# Patient Record
Sex: Male | Born: 1935 | Race: White | Hispanic: No | Marital: Married | Smoking: Former smoker
Health system: Southern US, Community
[De-identification: ages and names within clinical notes are randomized; demographics above are authoritative.]

## PROBLEM LIST (undated history)

## (undated) DIAGNOSIS — R569 Unspecified convulsions: Secondary | ICD-10-CM

## (undated) DIAGNOSIS — K219 Gastro-esophageal reflux disease without esophagitis: Secondary | ICD-10-CM

## (undated) DIAGNOSIS — I4891 Unspecified atrial fibrillation: Secondary | ICD-10-CM

## (undated) DIAGNOSIS — I10 Essential (primary) hypertension: Secondary | ICD-10-CM

## (undated) DIAGNOSIS — J189 Pneumonia, unspecified organism: Secondary | ICD-10-CM

## (undated) DIAGNOSIS — R413 Other amnesia: Secondary | ICD-10-CM

## (undated) DIAGNOSIS — M1990 Unspecified osteoarthritis, unspecified site: Secondary | ICD-10-CM

## (undated) DIAGNOSIS — E559 Vitamin D deficiency, unspecified: Secondary | ICD-10-CM

## (undated) DIAGNOSIS — A419 Sepsis, unspecified organism: Secondary | ICD-10-CM

## (undated) DIAGNOSIS — D509 Iron deficiency anemia, unspecified: Secondary | ICD-10-CM

## (undated) DIAGNOSIS — F5104 Psychophysiologic insomnia: Secondary | ICD-10-CM

## (undated) DIAGNOSIS — R42 Dizziness and giddiness: Secondary | ICD-10-CM

## (undated) DIAGNOSIS — I2510 Atherosclerotic heart disease of native coronary artery without angina pectoris: Secondary | ICD-10-CM

## (undated) DIAGNOSIS — E039 Hypothyroidism, unspecified: Secondary | ICD-10-CM

## (undated) DIAGNOSIS — R404 Transient alteration of awareness: Secondary | ICD-10-CM

## (undated) DIAGNOSIS — N183 Chronic kidney disease, stage 3 unspecified: Secondary | ICD-10-CM

## (undated) DIAGNOSIS — E7800 Pure hypercholesterolemia, unspecified: Secondary | ICD-10-CM

## (undated) DIAGNOSIS — C801 Malignant (primary) neoplasm, unspecified: Secondary | ICD-10-CM

## (undated) DIAGNOSIS — T8859XA Other complications of anesthesia, initial encounter: Secondary | ICD-10-CM

## (undated) DIAGNOSIS — T4145XA Adverse effect of unspecified anesthetic, initial encounter: Secondary | ICD-10-CM

## (undated) DIAGNOSIS — I739 Peripheral vascular disease, unspecified: Secondary | ICD-10-CM

## (undated) DIAGNOSIS — R269 Unspecified abnormalities of gait and mobility: Secondary | ICD-10-CM

## (undated) HISTORY — PX: LAMINECTOMY: SHX219

## (undated) HISTORY — DX: Unspecified osteoarthritis, unspecified site: M19.90

## (undated) HISTORY — DX: Iron deficiency anemia, unspecified: D50.9

## (undated) HISTORY — PX: TONSILLECTOMY: SUR1361

## (undated) HISTORY — DX: Vitamin D deficiency, unspecified: E55.9

## (undated) HISTORY — DX: Transient alteration of awareness: R40.4

## (undated) HISTORY — PX: HERNIA REPAIR: SHX51

## (undated) HISTORY — DX: Unspecified abnormalities of gait and mobility: R26.9

## (undated) HISTORY — DX: Psychophysiologic insomnia: F51.04

## (undated) HISTORY — PX: KNEE ARTHROPLASTY: SHX992

## (undated) HISTORY — DX: Other amnesia: R41.3

## (undated) HISTORY — DX: Peripheral vascular disease, unspecified: I73.9

## (undated) HISTORY — PX: EYE SURGERY: SHX253

## (undated) HISTORY — PX: BACK SURGERY: SHX140

## (undated) HISTORY — DX: Chronic kidney disease, stage 3 unspecified: N18.30

## (undated) NOTE — *Deleted (*Deleted)
Progress Note  Patient Name: [name] Date of Encounter: 01/08/2020  [facility] Cardiologist: Thurmon Fair, MD ***  Subjective   ***  Inpatient Medications    Scheduled Meds: . diltiazem  240 mg Oral Daily  . divalproex  250 mg Oral BID  . doxazosin  4 mg Oral QHS  . levothyroxine  150 mcg Oral QAC breakfast  . metoprolol tartrate  25 mg Oral BID  . pravastatin  40 mg Oral QPM  . predniSONE  9 mg Oral Daily  . sodium chloride flush  3 mL Intravenous Q12H   Continuous Infusions: . lactated ringers 0 mL (01/08/20 0935)  . pantoprozole (PROTONIX) infusion 8 mg/hr (01/08/20 1205)   PRN Meds: acetaminophen **OR** acetaminophen, hydrALAZINE, HYDROcodone-acetaminophen, morphine injection, ondansetron **OR** ondansetron (ZOFRAN) IV   Vital Signs    Vitals:   01/08/20 1321 01/08/20 1430 01/08/20 1450 01/08/20 1513  BP: 134/78 (!) 101/49 118/67 126/74  Pulse: 73 78 (!) 57   Resp: (!) 22 (!) 24 17 16   Temp: (!) 97.3 F (36.3 C) 97.7 F (36.5 C)  97.7 F (36.5 C)  TempSrc: Temporal Tympanic  Oral  SpO2: 96%  95% 99%  Height:        Intake/Output Summary (Last 24 hours) at 01/08/2020 1906 Last data filed at 01/08/2020 1425 Gross per 24 hour  Intake 1876.45 ml  Output 1450 ml  Net 426.45 ml   Last 3 Weights 12/29/2019 12/01/2019 10/27/2019  Weight (lbs) 155 lb 155 lb 155 lb  Weight (kg) 70.308 kg 70.308 kg 70.308 kg      Telemetry    *** - Personally Reviewed  ECG    *** - Personally Reviewed  Physical Exam  *** GEN: No acute distress.   Neck: No JVD Cardiac: RRR, no murmurs, rubs, or gallops.  Respiratory: Clear to auscultation bilaterally. GI: Soft, nontender, non-distended  MS: No edema; No deformity. Neuro:  Nonfocal  Psych: Normal affect   Labs    High Sensitivity Troponin:   Recent Labs  Lab 01/07/20 0507 01/07/20 0641  TROPONINIHS 151* 157*      Chemistry Recent Labs  Lab 01/07/20 0507 01/08/20 0553  NA 141 141  K 4.4  4.2  CL 106 108  CO2 23 23  GLUCOSE 92 80  BUN 49* 37*  CREATININE 2.03* 1.60*  CALCIUM 9.0 8.3*  PROT 5.2*  --   ALBUMIN 3.2*  --   AST 19  --   ALT 11  --   ALKPHOS 52  --   BILITOT 0.9  --   GFRNONAA 32* 42*  ANIONGAP 12 10     Hematology Recent Labs  Lab 01/07/20 1643 01/08/20 0553 01/08/20 1810  WBC 9.0 7.9 8.8  RBC 2.62* 2.59* 2.78*  HGB 8.4* 8.2* 9.0*  HCT 27.5* 26.6* 28.9*  MCV 105.0* 102.7* 104.0*  MCH 32.1 31.7 32.4  MCHC 30.5 30.8 31.1  RDW 14.6 14.6 14.3  PLT 123* 134* 139*    BNP Recent Labs  Lab 01/07/20 0507  BNP 519.4*     DDimer No results for input(s): DDIMER in the last 168 hours.   Radiology    DG Chest 2 View  Result Date: 01/07/2020 CLINICAL DATA:  Chest pain EXAM: CHEST - 2 VIEW COMPARISON:  12/20/2018 FINDINGS: Streaky density at the left lung base, unchanged and attributed to scarring. There is no edema, consolidation, effusion, or pneumothorax. Normal heart size and mediastinal contours when allowing for rotation. Coronary atherosclerosis. Advanced lower  thoracic and lumbar spine degeneration IMPRESSION: Scarring at the left lung base. No acute finding when compared to prior. Electronically Signed   By: Marnee Spring M.D.   On: 01/07/2020 05:52    Cardiac Studies   Echo: 03/2018 Study Conclusions  - Left ventricle: The cavity size was normal. Wall thickness was  increased in a pattern of moderate LVH. Systolic function was  normal. The estimated ejection fraction was in the range of 55%  to 60%. Left ventricular diastolic function parameters were  normal.  - Aortic valve: There was mild regurgitation.  - Mitral valve: Calcified annulus. Mildly thickened leaflets .  There was mild regurgitation. Valve area by pressure half-time:  1.04 cm^2.  - Left atrium: The atrium was mildly dilated.  - Atrial septum: No defect or patent foramen ovale was identified.   Patient Profile     45 y.o. male with a hx of this post  PCI to RCA '02, diastolic heart failure, severe orthostatic hypotension, CKD stage III, chronic atrial fibrillation, chronic slow healing right BKA wound with history of severe spontaneous hemorrhage (has been off anticoagulation), and chronic corticosteroid therapy who is being seen today for the evaluation of preop evaluation at the request of Dr. Ophelia Charter  Assessment & Plan    # Preop evaluation in the setting of acute GI bleed/planned EGD: Presents with anemia and positive fecal occult stool.  Has history of chronic atrial fibrillation but not on oral anticoagulation secondary to severe bleeding from a right BKA.  Has only been on aspirin 81 mg daily.  Has history of CAD with PCI to the RCA in 2002. Did have some chest pain that was burning in nature prior to admission. hsTn mildly elevated with flat trend not suggestive of ACS.  EKG with rate controlled atrial fibrillation.  No further cardiac work-up at this time.  He is notably at higher risk just given his coronary disease and chronic comorbidities, this is not prohibitive. -- Continue to monitor volume status closely as he may require diuretics post procedure --Follow CBC, would transfuse for hemoglobin less than 8  # Chronic atrial fibrillation: Had previously been placed on Eliquis and developed severe spontaneous hemorrhage with right BKA.  Decision has been made to keep him off of anticoagulation since that time.  He is currently in rate controlled atrial fibrillation.  --Continue home medications  # CAD status post PCI to RCA '02:  -Continue ASA and statin  # CKD stage III:  --Daily BMET  # Hyperlipidemia: On statin {Are we signing off today?:[id number]}  For questions or updates, please contact [facility] Please consult [email or web address] for contact info under        Signed, Meriam Sprague, MD  01/08/2020, 7:06 PM

---

## 1999-02-16 ENCOUNTER — Inpatient Hospital Stay (HOSPITAL_COMMUNITY): Admission: RE | Admit: 1999-02-16 | Discharge: 1999-02-21 | Payer: Self-pay | Admitting: Orthopaedic Surgery

## 1999-02-16 ENCOUNTER — Encounter: Payer: Self-pay | Admitting: Orthopaedic Surgery

## 1999-02-16 ENCOUNTER — Encounter (INDEPENDENT_AMBULATORY_CARE_PROVIDER_SITE_OTHER): Payer: Self-pay | Admitting: Specialist

## 1999-03-20 ENCOUNTER — Encounter: Admission: RE | Admit: 1999-03-20 | Discharge: 1999-05-01 | Payer: Self-pay | Admitting: Orthopaedic Surgery

## 2000-09-30 ENCOUNTER — Ambulatory Visit (HOSPITAL_COMMUNITY): Admission: RE | Admit: 2000-09-30 | Discharge: 2000-10-01 | Payer: Self-pay | Admitting: Cardiology

## 2000-10-29 ENCOUNTER — Encounter (HOSPITAL_COMMUNITY): Admission: RE | Admit: 2000-10-29 | Discharge: 2001-01-27 | Payer: Self-pay | Admitting: Cardiology

## 2001-04-10 ENCOUNTER — Ambulatory Visit (HOSPITAL_COMMUNITY): Admission: RE | Admit: 2001-04-10 | Discharge: 2001-04-10 | Payer: Self-pay | Admitting: Gastroenterology

## 2002-10-26 ENCOUNTER — Encounter: Admission: RE | Admit: 2002-10-26 | Discharge: 2002-10-26 | Payer: Self-pay | Admitting: Orthopedic Surgery

## 2002-10-26 ENCOUNTER — Encounter: Payer: Self-pay | Admitting: Orthopedic Surgery

## 2003-02-02 ENCOUNTER — Inpatient Hospital Stay (HOSPITAL_COMMUNITY): Admission: RE | Admit: 2003-02-02 | Discharge: 2003-02-05 | Payer: Self-pay | Admitting: Orthopaedic Surgery

## 2004-11-26 ENCOUNTER — Encounter: Admission: RE | Admit: 2004-11-26 | Discharge: 2004-11-26 | Payer: Self-pay | Admitting: Orthopaedic Surgery

## 2004-12-12 ENCOUNTER — Encounter: Admission: RE | Admit: 2004-12-12 | Discharge: 2004-12-12 | Payer: Self-pay | Admitting: Orthopaedic Surgery

## 2004-12-26 ENCOUNTER — Encounter: Admission: RE | Admit: 2004-12-26 | Discharge: 2004-12-26 | Payer: Self-pay | Admitting: Orthopaedic Surgery

## 2005-06-18 ENCOUNTER — Encounter: Admission: RE | Admit: 2005-06-18 | Discharge: 2005-06-18 | Payer: Self-pay | Admitting: Internal Medicine

## 2005-09-23 ENCOUNTER — Encounter: Admission: RE | Admit: 2005-09-23 | Discharge: 2005-09-23 | Payer: Self-pay | Admitting: Neurosurgery

## 2006-04-19 ENCOUNTER — Encounter: Admission: RE | Admit: 2006-04-19 | Discharge: 2006-04-19 | Payer: Self-pay | Admitting: Neurosurgery

## 2007-05-25 ENCOUNTER — Inpatient Hospital Stay (HOSPITAL_COMMUNITY): Admission: EM | Admit: 2007-05-25 | Discharge: 2007-05-28 | Payer: Self-pay | Admitting: Emergency Medicine

## 2007-05-26 ENCOUNTER — Ambulatory Visit: Payer: Self-pay | Admitting: Infectious Diseases

## 2007-09-15 ENCOUNTER — Inpatient Hospital Stay (HOSPITAL_COMMUNITY): Admission: AD | Admit: 2007-09-15 | Discharge: 2007-09-17 | Payer: Self-pay | Admitting: Neurosurgery

## 2007-09-25 ENCOUNTER — Inpatient Hospital Stay (HOSPITAL_COMMUNITY): Admission: RE | Admit: 2007-09-25 | Discharge: 2007-09-29 | Payer: Self-pay | Admitting: Neurosurgery

## 2007-09-26 ENCOUNTER — Ambulatory Visit: Payer: Self-pay | Admitting: Physical Medicine & Rehabilitation

## 2008-04-04 ENCOUNTER — Encounter: Admission: RE | Admit: 2008-04-04 | Discharge: 2008-04-04 | Payer: Self-pay | Admitting: Neurosurgery

## 2009-04-08 ENCOUNTER — Encounter: Admission: RE | Admit: 2009-04-08 | Discharge: 2009-04-08 | Payer: Self-pay | Admitting: Neurosurgery

## 2009-05-30 ENCOUNTER — Inpatient Hospital Stay (HOSPITAL_COMMUNITY): Admission: RE | Admit: 2009-05-30 | Discharge: 2009-05-31 | Payer: Self-pay | Admitting: Neurosurgery

## 2010-04-02 ENCOUNTER — Encounter: Payer: Self-pay | Admitting: Orthopaedic Surgery

## 2010-05-15 ENCOUNTER — Other Ambulatory Visit: Payer: Self-pay | Admitting: Dermatology

## 2010-06-05 LAB — CBC
HCT: 36.3 % — ABNORMAL LOW (ref 39.0–52.0)
Hemoglobin: 12.4 g/dL — ABNORMAL LOW (ref 13.0–17.0)
MCHC: 34.1 g/dL (ref 30.0–36.0)
MCV: 98.1 fL (ref 78.0–100.0)
Platelets: 259 10*3/uL (ref 150–400)
RBC: 3.7 MIL/uL — ABNORMAL LOW (ref 4.22–5.81)
RDW: 14.6 % (ref 11.5–15.5)
WBC: 9.6 10*3/uL (ref 4.0–10.5)

## 2010-06-05 LAB — BASIC METABOLIC PANEL
BUN: 15 mg/dL (ref 6–23)
CO2: 28 mEq/L (ref 19–32)
Calcium: 9.1 mg/dL (ref 8.4–10.5)
Chloride: 105 mEq/L (ref 96–112)
Creatinine, Ser: 1.09 mg/dL (ref 0.4–1.5)
GFR calc Af Amer: >60 mL/min (ref >60)
GFR calc non Af Amer: >60 mL/min (ref >60)
Glucose, Bld: 114 mg/dL — ABNORMAL HIGH (ref 70–99)
Potassium: 4.4 mEq/L (ref 3.5–5.1)
Sodium: 140 mEq/L (ref 135–145)

## 2010-06-05 LAB — SURGICAL PCR SCREEN
MRSA, PCR: NEGATIVE
Staphylococcus aureus: POSITIVE — AB

## 2010-07-25 NOTE — H&P (Signed)
Robert, [name] NO.:  [id number]   MEDICAL RECORD NO.:  [id number]          PATIENT TYPE:  INP   LOCATION:  3028                         FACILITY:  [facility]   PHYSICIAN:  [name], M.D.DATE OF BIRTH:  11-11-1933   DATE OF ADMISSION:  09/15/2007  DATE OF DISCHARGE:                              HISTORY & PHYSICAL   ADMISSION HISTORY AND PHYSICAL EXAMINATION:  The patient is a 77-year-  old right-handed white male who has been under my care since December  2006 for difficulties with low back pain and neurogenic claudication.  He has been evaluated several times over the past several years with MRI  scans and was found to have multilevel degenerations to the lumbar spine  including T12-L1, L1-L2, L2-L3, L3-L4, and L4-L5.  Over the past couple  of years, we have been able to manage his low back and radicular  discomfort through epidural steroid injections.  Most recently, he has  been having an injection anywhere from every 5-9 months with good relief  and he has been able to manage with reasonably stable function.  In  addition to the degenerative changes with multilevel stenosis of varying  severities, there has been associated disc protrusions, neural foraminal  stenosis, and a grade 1 spondylolisthesis at L4-L5.   However, the patient has had a recent change this past month.  He was  quite active.  He went to a class reunion and several family  celebrations, and about 3 weeks ago, he began to develop discomfort in  the low back extending down to the buttocks bilaterally.  The discomfort  has gradually worsened over the past week and a half.  He has developed  some weakness in the lower extremities.  He has had some tingling in the  lower extremities for a number of years.  He does not feel that that has  changed.  He denies any numbness.  He denies any bowel or bladder  dysfunction or incontinence and he says the discomfort in his back is  typically  relieved by sitting and he has been resting at home for the  past week to week and a half.  He has attributed this difficulty to  having overdone it with his physical activities.   The patient presented to the office.  On exam, we found a significant  paraparesis with weakness in the lower extremities, both proximally and  distally.  He required 2 persons assist to transfer from chair to  examining table and back to chair and it was felt best to admit him for  evaluation because of pronounced new neurologic deficit and concerns  about his safety at home and the patient is now admitted for further  evaluation including MRI scans and probable neurology consultation.   PAST MEDICAL HISTORY:  1. History of hypertension.  2. Gastroesophageal reflux disease.  3. Hypothyroidism.  4. Osteoarthritis.  5. Hypercholesterolemia.  6. No history of myocardial infarction, cancer, stroke, diabetes, or      lung disease.   PAST SURGICAL HISTORY:  1. Right knee replacement in November 1996.  2.  Second right knee replacement in November 2000.  3. Left knee replacement, December 2004.   He denies allergies to medications.   MEDICATIONS:  Medications have included prednisone prescribed by his  Rheumatologist, Dr. Corliss Skains.  He normally takes 5 mg each day, but  when he is more symptomatic, he will take 10 mg, and for the past week  and a half, he has been taking 10 mg each day.  Also, Cozaar 100 mg  daily, Crestor 20 mg daily, aspirin 81 mg nightly, but he said he has  had a bit of a recent bleeding tendency, and therefore, he was cut back  to taking aspirin 81 mg twice a week, diclofenac 75 mg b.i.d., Levoxyl  150 mcg daily, atenolol 50 mg daily, Nexium 40 mg daily, Norvasc 10 mg  daily, Zetia 10 mg nightly, Fosamax 70 mg every week.   FAMILY HISTORY:  Parents passed on, mother at aged 16, father at age 77.  There is family history of diabetes, hypertension, and kidney disease.   SOCIAL  HISTORY:  The patient is a retired Teacher, early years/pre.  He does not  smoke.  He drinks alcoholic beverages socially.  He denies history of  substance abuse.   REVIEW OF SYSTEMS:  Notable for those described in the history of  present illness and past medical history, but is otherwise unremarkable.   PHYSICAL EXAMINATION:  GENERAL:  The patient is a well-developed, well-  nourished white male in no acute distress.  VITAL SIGNS:  Temperature is 97.7, pulse is 92, blood pressure 154/93,  respiratory rate 18.  O2 saturation is 98% on room air.  Height 5 feet  11 inches.  Weight is 95 kg.  LUNGS:  Clear to auscultation.  He has symmetric respiratory excursion.  HEART:  Regular rate and rhythm.  S1 and S2.  There is no murmur.  ABDOMEN:  Soft, nondistended.  Bowel sounds are present.  EXTREMITIES:  A well-healed incision on the knees.  There is no  clubbing, cyanosis, or edema.  MUSCULOSKELETAL:  No tenderness to palpation over the lumbar spinous  process or paralumbar musculature.  Straight leg raising is negative  bilaterally.  NEUROLOGIC:  Significant paraparesis, the iliopsoas is 4- bilaterally,  the left quadriceps is 5, the right is 4+ to 5, the dorsiflexor to  extensor hallucis longus is 3 to 4- on the left, on the right the  dorsiflexor to extensor hallucis longus is 0.  Plantar flexion is 4+ to  5 bilaterally.  Sensation is intact to pinpricks to the upper  extremities to the torso as well as to his lower extremities.  Reflexes,  biceps are minimal, triceps are trace, quadriceps are 1.  They are  symmetrical bilaterally.  The left quadriceps is 1 to 2 and the right is  trace.  Hamstring was absent bilaterally.  Toes were upgoing  bilaterally.  His gait and stance are quite unsteady and he required 2  persons to assist to both for gait, stance, and transfers.   IMPRESSION:  The patient with long history of difficulties with low back  with extensive multilevel degenerative disc diseases  with spondylosis  with associated lumbar stenosis at T12-L1, L1-L2, L2-L3, L3-L4, L4-L5  with degenerative static spondylolisthesis at L4-L5, grade 1, with a  recent progressive paraparesis, the etiology of which is uncertain.  Sensation appears to be intact.  Toes seem to be upgoing.   PLAN:  The patient will be admitted for further workup.  We will request  MRI  scans of thoracic and lumbar spine.  We will probably obtain  neurology consultation.  We will obtain baseline admission laboratories  and plan on further workup as indicated.  The patient will be placed on  PAS Boots.  His usual home medication will be continued other than for  his aspirin.      [name], M.D.  Electronically Signed     RWN/MEDQ  D:  09/16/2007  T:  09/17/2007  Job:  [id number]   cc:   Marlan Palau, M.D.

## 2010-07-25 NOTE — Consult Note (Signed)
NAMECALIBER, LANDESS NO.:  [id number]   MEDICAL RECORD NO.:  [id number]          PATIENT TYPE:  INP   LOCATION:  6738                         FACILITY:  [facility]   PHYSICIAN:  Kristine Garbe. Ezzard Standing, M.D.DATE OF BIRTH:  03/15/1933   DATE OF CONSULTATION:  05/26/2007  DATE OF DISCHARGE:                                 CONSULTATION   REFERRING PHYSICIAN:   REASON FOR CONSULTATION:  Evaluate patient with periorbital cellulitis.   BRIEF HISTORY:  [name] is a 77 year old gentleman who initially  developed fever and chills Friday night.  He awoke on Saturday morning  with some swelling of his face that rapidly progressed to a Sunday  morning where he had diffuse swelling of the periorbital area with his  eyes almost closed, and presented to the emergency room and diagnosed  with periorbital cellulitis.  He was started on IV antibiotics,  Rocephin, and vancomycin.  He had a CT scan of his sinus region which  shows some minimal ethmoid sinus disease, but really no evidence of  extension of the infection from the sinuses to the periorbital area.  No  evidence of infection in the nose, as there is no significant intranasal  swelling.  If this infection originated from the sinus, we would expect  it to be more unilateral, and more periorbital swelling; but there is no  real swelling on the other side of the lamina.  No evidence of extension  of the ethmoid disease into the lamina or periorbital area.  Since  starting IV antibiotics, the patient has done much better with much less  swelling, less erythema and induration.  White counts have gone from  11,800 down to 10,200.   IMPRESSION:  Bilateral periorbital of cellulitis, questionable etiology.  Minimal sinus disease, and really on review of the CT scan, does not  appear to have originated from the sinus area.  Although, theoretically  could have introduced sinus or nasal bacteria into his eyelids by  blowing his  nose and rubbing his eyes.   RECOMMENDATIONS:  Continue with present therapy and advice per  infectious disease.  He can follow up in my office if he has any  persistent sinus problems.           ______________________________  Kristine Garbe Ezzard Standing, M.D.     CEN/MEDQ  D:  05/26/2007  T:  05/27/2007  Job:  [id number]

## 2010-07-25 NOTE — H&P (Signed)
NAMETAVARIS, EUDY NO.:  [id number]   MEDICAL RECORD NO.:  [id number]          PATIENT TYPE:  INP   LOCATION:  3005                         FACILITY:  [facility]   PHYSICIAN:  [name], M.D.DATE OF BIRTH:  [birth year]   DATE OF ADMISSION:  09/25/2007  DATE OF DISCHARGE:                              HISTORY & PHYSICAL   HISTORY OF PRESENT ILLNESS:  The patient is a 77 year old right-handed  white male who I cared for since December 2006 for difficulties of low  back pain and neurogenic claudication.  He is studied with number of MRI  scans over the years.  He has been found to have multilevel degeneration  at T12-L1, L1-L2, L2-L3, L3-L4, and L4-L5 including degenerative  scoliosis in the upper lumbar spine and a spondylolisthesis at L4 and  L5.  We have treated him with series of spinal injections over the years  and he has tolerated well with that.   Last month, he began to develop increasing difficulties.  He was quite  active.  He went to a class reunion.  He had several family celebrations  and he began to do some chronic exercise and he began to notice  increasing discomfort in his back and buttocks.  He came to the office  for evaluation last week and we found to have a significant paraparesis.  The patient was admitted to the hospital and evaluated with MRI of the  lumbar spine and we also obtained neurology consultation with Dr. [name].  The MRI again showed significant multilevel degeneration and  multilevel stenosis with no acute changes.  Dr. Anne Hahn evaluated the  patient and he felt that the weakness was due to stenosis, spondylosis,  nerve compression, right extremity myopathy or neurodegenerative  condition.  The patient was discharged last week and sent to Dr. Anne Hahn'  office for outpatient EMG nerve conduction study.  Again, Dr. Anne Hahn  felt that those studies showed no evidence of myopathy or  neurodegenerative condition but rather  chronic radiculopathy.  The  patient was reexamined 3 days ago, found to continue to have a  significant paraparesis, somewhat worse on the right than the left, and  he is admitted now for decompression.   I reviewed his case with both Dr. Coletta Memos and Dr. Julio Sicks, both  felt and asked to proceed with decompression rather than decompression  and stabilization.  We discussed all this thoroughly with the patient  and his wife and the patient is admitted now for a multilevel lumbar  laminectomy for decompression of the thecal sac and exiting nerve roots.   Functionally, he has been using a rolling walker with assistance for  every short distances, but for longer distances beyond 20-25 feet, he is  having to use a wheelchair.  He does require 2-person assist to get from  the wheelchair to the examination table and back.   Past medical history, past surgical history, medications, family history  and social history are per my September 15, 2007, admission note.   REVIEW OF SYSTEMS:  Notable for as described in history  of present  illness and past medical history, but is otherwise unremarkable.   PHYSICAL EXAMINATION:  GENERAL:  The patient is a well-developed and  well-nourished white male in no acute distress.  VITAL SIGNS:  Temperature 98.6, pulse 77, blood pressure 133/73,  respiratory rate 18, height 5 feet 11 inches, and weight 95 kg.  LUNGS:  Clear to auscultation.  He has symmetric respiratory excursion.  HEART:  Regular rate and rhythm.  Normal S1 and S2.  There is no murmur.  EXTREMITIES:  No clubbing, cyanosis, or edema.  MUSCULOSKELETAL:  No tenderness to palpation over the lumbar spinous  process or paralumbar musculature.  Straight leg raising is negative  bilaterally.  NEUROLOGIC:  Significant paraparesis right worse than left.  The  iliopsoas is 4- to 4 on the left, 3 to 4- on the right, quadriceps is 5  on the left, 4+ to 5 on the right, dorsiflexion is 2 on the left, 1  to 2  on the right, extensor hallucis longus is 2 to 3 on the left, 0 to 1 on  the right, and plantar flexion is 4+ to 5 bilaterally.  Sensation is  intact to pinprick.  The upper and lower extremities reflexes are  minimal in the biceps, trace in the triceps, and quadriceps are 1.  Toes  were upgoing bilaterally.  His gait and stance are quite unsteady and he  requires 2-person assist for stance and transfers.   IMPRESSION:  The patient has long history of low back difficulties with  multilevel degenerative changes including degenerative diseases,  spondylosis, and stenosis at T12-L1, L1-L2, L2-L3, L3-L4, and L4-L5.  He  has a static degenerative spondylolisthesis at L4-L5, grade 1, and  scoliosis is degenerative in nature involving the upper lumbar spine,  but he has recently developed progressive paraparesis, which is not  improved.  This was first evaluated about a week and a half ago.  Dr.  Anne Hahn feels that his difficulties with weakness are due to the nerve  compression in the lumbar spine and does not feel that there is an  underlying myopathy or neurodegenerative condition.   PLAN:  The patient will be admitted for T12 to L5 decompressive lumbar  laminectomy.  We discussed the nature of his condition, nature of the  surgery, alternative surgical procedures, alternatives to surgery, and  the risks with and without surgical decompression, the risk of surgical  decompression without stabilization and the risks of the surgical  procedure itself including risk of infection, bleeding, possibility of  transfusion, the risk of nerve dysfunction, pain, weakness, numbness, or  paresthesias, the risk of joint tear and CSF leakage possibility of  further surgery, and risk of instability of his spine and possibility of  failed surgery, anesthetic risks of myocardial infarction, stroke, and  death.  After understanding of all this, he does wish to go ahead with  surgery and he is admitted  for such.      [name], M.D.  Electronically Signed     RWN/MEDQ  D:  09/25/2007  T:  09/26/2007  Job:  [id number]

## 2010-07-25 NOTE — Discharge Summary (Signed)
[name] [name].:  [id number]   MEDICAL RECORD [name].:  [id number]          PATIENT TYPE:  INP   LOCATION:  3005                         FACILITY:  [facility]   PHYSICIAN:  [name] [name], M.D.DATE OF BIRTH:  01/29/1934   DATE OF ADMISSION:  09/25/2007  DATE OF DISCHARGE:  09/29/2007                               DISCHARGE SUMMARY   ADMISSION HISTORY AND PHYSICAL EXAMINATION:  The patient is a 77-year-  old man whom I have treated for over 2-1/2 years for advanced  degeneration in the low back with associated low back pain and  neurogenic claudication, last month he developed progressive  paraparesis, and we reevaluated him and he had severe multilevel  multifactorial lumbar stenosis and we had him undergo a Neurology  evaluation with Dr. Lesia [name] to ensure that there was [name] underlying  myopathy or neurodegenerative condition.  He felt that the patient's  paraparesis was due to his multilevel spinal stenosis and recommended  decompression, and the patient was admitted for such.  General  examination was unremarkable, however, his neurologic examination showed  significant paraparesis, right worse than left.  The iliopsoas is 4-/4  on the left, 3 to 4- on the right, quadriceps was 5 on the left and 4+  to 5 on the right, dorsiflexors 2 on the left and 1-2 on the right,  extensor hallucis longus 2-3 on the left and 0-1 on the right, and  plantar flexors 4+ to 5 bilaterally.   HOSPITAL COURSE:  The patient was admitted and underwent T12-S1  decompressive lumbar laminectomy with decompression of the T12, L1, L2,  L3, L4, L5, and S1 nerve roots bilaterally.  Postoperatively, he has  made very good progress with more recovery than we had in fact  anticipated.  We did have him seen by Physical Therapy and Occupational  Therapy.  He was also seen in consultation by Physical Medicine  Rehabilitation.  Dr. Faith [name] felt that he did not require  comprehensive impatient rehabilitation, but rather Dr. Riley [name]  recommended a home health PT and OT and arrangements have made for that.  They have a rolling walker and a 3-in-1 at home already.  His wound is  healing nicely and he has had more to recovery since surgery.  He is to  return in 2 days to the office for staple removal.  He has been given  prescription for Percocet 1 or 2 q.4-6 h. p.r.n. pain 50 tablets [name]  refills and Flexeril 5 mg q.8 h. p.r.n. muscle spasms 90 tablets and 1  refill.   DISCHARGE DIAGNOSES:  1. Lumbar stenosis.  2. Lumbar spondylosis.  3. Neurogenic claudication.  4. Paraparesis.      [name] [name], M.D.  Electronically Signed     RWN/MEDQ  D:  09/29/2007  T:  09/30/2007  Job:  [id number]

## 2010-07-25 NOTE — Op Note (Signed)
NAMEBRACE, WELTE NO.:  [id number]   MEDICAL RECORD NO.:  [id number]          PATIENT TYPE:  INP   LOCATION:  3005                         FACILITY:  [facility]   PHYSICIAN:  [name], M.D.DATE OF BIRTH:  January 25, 1934   DATE OF PROCEDURE:  09/25/2007  DATE OF DISCHARGE:                               OPERATIVE REPORT   PREOPERATIVE DIAGNOSES:  1. Thoracolumbar scoliosis, T12-L5.  2. L4-L5 degenerative spondylolisthesis.  3. Degenerative lumbar scoliosis.  4. Lumbar spondylosis.  5. Lumbar degenerative disk disease.   POSTOPERATIVE DIAGNOSES:  1. Thoracolumbar scoliosis, T12-L5.  2. L4-5 degenerative spondylolisthesis.  3. Degenerative lumbar scoliosis.  4. Lumbar spondylosis.  5. Lumbar degenerative disk disease.   PROCEDURE:  T12-S1 lumbar laminectomy with decompression of the T12, L1,  L2, L3, L4, L5, and S1 nerve roots bilaterally with microdissection.   SURGEON:  [name], MD   ASSISTANTS:  1. Russell L. Webb Silversmith, RN  2. Colon Branch, MD   ANESTHESIA:  General endotracheal.   INDICATIONS:  The patient is a 77 year old man who has had difficulties  with low back and radicular pain for several years.  We have been  treating him with periodic epidural steroid injections.  He has done  well with that, but he presented earlier this month with progressive  weakness in the lower extremities, which developed last month.  He was  restudied with an updated MRI scan, which showed extensive degeneration  with stenosis seen at T12-L1, L1-2, L2-3, L3-4, and L4-5.  We discussed  with both, the patient and his wife as well as some of the other  neurosurgeons in my practice as well as in the community, reviewed his  images with them, whether to proceed solely withdecompression or  decompression and stabilization.  Final recommendation was made to  proceed with decompression alone.  The patient was brought to surgery  for such.   PROCEDURE:  The  patient was brought to the operating room and placed  under general endotracheal anesthesia.  The patient was turned to a  prone position.  Lumbar region was prepped with Betadine soap and  solution and draped in a sterile fashion.  The midline was infiltrated  with local anesthetic with epinephrine.  The midline incision made  carried down through the subcutaneous tissue.  Bipolar electrocautery  was used to maintain hemostasis.  Dissection was carried down to the  lumbar fascia, which was incised bilaterally.  The paraspinal muscles  were dissected off the spinous process and lamina in a subperiosteal  fashion.  A self-retaining retractor was placed and an x-ray was taken  to confirm localization and then we proceed with T12-S1.  We then began  the laminectomy with modification and decompression within the spinal  canal was done with microdissection and microsurgical technique.  Laminectomy was performed from T12 to S1 using a double-action rongeurs,  the Xoman drill and Kerrison punches.  Care was taken to leave the  underlying dura undisturbed.  There was marked ligamentum flavum both  dorsally as well as laterally at each of the levels.  This was  carefully  dissected and removed so as to decompress the thecal sac and nerve  roots.  The up-angled curettes were used to help mobilize the thickened  ligament tissue from the lateral recesses decompressing the nerve roots  as they existed, and we able to carefully decompress the exiting T12,  L1, L2, L3, L4, L5, and S1 nerve roots bilaterally.  The edges of the  bone were waxed as needed with bone wax.  We found laxity of the facets  particularly at the L4-5 level, but extensive facet degeneration at  multiple levels.  The wound was irrigated extensively with saline  solution and subsequently with Bacitracin solution.  Once decompression  was completed, hemostasis was established, and then we placed a Gelfoam  soaked in thrombin in the  laminectomy defect.  Hemostasis was  reconfirmed and then we proceeded with the closure.  The paraspinal  muscles were approximated with interrupted undyed 1-0 Vicryl sutures.  The deep fascia was closed with interrupted undyed 1-0 Vicryl sutures.  Scarpa fascia was closed with interrupted undyed and inverted 1-0 Vicryl  sutures and the subcutaneous and subcuticular were closed with inverted  2-0 undyed Vicryl sutures.  Skin edges were closed with surgical  staples.  The wound was dressed with Adaptic and sterile gauze, and 4-  inch Hypafix.  The procedure was tolerated well.  Following the  procedure, the patient was transferred to the recovery room for further  care.      [name], M.D.  Electronically Signed     RWN/MEDQ  D:  09/25/2007  T:  09/26/2007  Job:  [id number]

## 2010-07-25 NOTE — H&P (Signed)
NAME:  [name] [name] [name].:  [id number]   MEDICAL RECORD NO.:  [id number]          PATIENT TYPE:  EMS   LOCATION:  MAJO                         FACILITY:  [facility]   PHYSICIAN:  Gwen Pounds, MD       DATE OF BIRTH:  03-Jun-1933   DATE OF ADMISSION:  05/25/2007  DATE OF DISCHARGE:                              HISTORY & PHYSICAL   PRIMARY CARE Adrienne Delay:  Dr. Jarold Motto.   EARS, NOSE AND THROAT:  Dr. Ezzard Standing.   CARDIOLOGIST:  Dr. Donnie Aho.   RHEUMATOLOGIST:  Dr. Corliss Skains.   CHIEF COMPLAINT:  Facial cellulitis.   HISTORY OF PRESENT ILLNESS:  This 77 year old male recently dealing with  constipation had to take magnesium citrate.  He had 3 bowel movements on  Friday.  I do not think that had anything to do with what happened  afterwards, but it was very important to the patient to tell me that.  Friday night after dinner, between 6 and 9 o'clock, he developed chills,  fatigue, and ended up going to be early.  When he woke up, he had  sweats, facial swelling, nausea, weakness, temperature of 102 to 103 and  myalgias, nasal congestion and nasal dryness.  He took Advil, Tylenol,  got more rest, did saline and Afrin and really did not have much help.  They called me and we discussed whether this might be flu or what else  and he was instructed that if things got worse or if he looked kind of  weird, just go on to the emergency room or urgent care.  He got worse  this morning and they called me and I said go to the emergency room; I  was not comfortable with the facial swelling.  In the emergency room, he  was seen and evaluated and diagnosed with facial cellulitis.  He was  given Rocephin, vancomycin, and will need admission to the hospital.   PAST MEDICAL HISTORY:  1. Hypertension.  2. Hyperlipidemia.  3. GERD.  4. Hypothyroidism.  5. Osteoarthritis.  6. Three total knee replacements.  7. Severe low back pain.  8. Coronary artery disease, status post percutaneous  intervention.  9. Right hernia repair.   ALLERGIES:  None but DEMEROL CAUSES NAUSEA.   MEDICATIONS:  1. Predinsone 5 mg daily for 2 to 3 years.  2. Cozaar 100 every day.  3. Atenolol 50 every day.  4. Amlodipine 5 every day.  5. Crestor 20 every day.  6. Zetia 10 every day.  7. Prevacid 30 every day versus Nexium 40 every day.  8. Diclofenac 75 b.i.d.  9. Levoxyl 150 every day.  10.Actonel 30 weekly.  11.Aspirin 81 daily.  12.Fish oil 1200 b.i.d.  13.Vitamin D 1000 at bedtime.  14.Calcium plus D.  15.Elavil 25 h.s.   SOCIAL HISTORY:  He does not smoke, 1 glass of wine per night, married,  retired Teacher, early years/pre.   FAMILY HISTORY:  Mother died of cancer.  Father died of Bright's  disease.   REVIEW OF SYSTEMS:  No retro-orbital pain.  No headaches, no visual  changes.  He  had a bad cold and cough 2 or 3 weeks ago.  No facial  trauma, cuts, or scratches.  Some nose hair issues for where he used to  pull a lot of nose hairs out and now just trims them, but has had mild  fever, arthralgias, chills, and sweats as stated in the HPI.  No other  symptoms noted in all other review of systems completed.   PHYSICAL EXAMINATION:  Temperature 97.4, blood pressure 122/69,  respiratory rate 16, heart rate 100 to 122, 97% saturations on room air.  FACE:  Bilateral eyes and eyelids very red, warm, and swollen.  Sclerae  also red and matted.  Cheek and nose also affected.  The right is worse  than the left; it is warm and tender.  His pupils are reactive.  His  extraocular movements are intact and there are no vision changes.  Oropharynx is clear.  He is drinking fine.  Bilateral nares have bloody  discharge and dry and stuffy.  NECK:  No JVD.  PULMONARY:  Clear to auscultation bilaterally.  CARDIAC:  Regular.  ABDOMEN:  Soft.  EXTREMITIES:  No edema.  There are no underlying abscesses noted.   ANCILLARY DATA:  Sodium 133, potassium 3.8, chloride 102, bicarb 21, BUN  21, creatinine 1.6,  glucose 123, white count 11.8, hemoglobin 12.7,  glucose 162, 88% segs.  CT of the face:  Nonspecific periorbital  swelling.  No abscess, no focal lesions noted.   ASSESSMENT:  This is a 77 year old man being admitted with facial  cellulitis who is on chronic steroids and had recent sinus infection  versus viral illness as a potential precipitating cause.   PLAN:  1. Admit.  2. I have already discussed this case with Ears, Nose, and Throat and      Dr. Ezzard Standing will see him in the morning or I can get Dr. Dorma Russell to      see him this afternoon should anything change.  There is no      evidence, signs, or symptoms of neural vein thrombosis and he will      not be treated for this at this current time unless there is any      clinical change.  We will do flu swab, nasal swab, and throat swab      to rule out MRSA, rule out flu, and rule out Strep throat as a      precipitating cause.  3. We will continue IV fluids to control the underlying dehydration      that he is starting to develop with the increased BUN and increased      creatinine.  At the same time, we will watch him on the Diclofenac      and, if the creatinine gets any worse, we are going to have stop      that.  4. We will control pain with Ultram and Tylenol.  5. Antibiotics will be Rocephin and vancomycin for the possibility of      MRSA.  6. Hold most medications and restart as needed.  His blood pressure is      fine at this time and a lot of them are for primary and secondary      cardiac prevention.  He does not need them in the short term 1 to 2      days.  7. Stress-dose steroids have already been ordered.  8. Nasal treatment to help clear up the sinuses have been ordered.  Followup creatinine in the morning has been ordered. Blood cultures      have been ordered. Question whether we need to be using Bactroban      to the bilateral internal of his nares.  If the culture comes back      positive, we will go ahead  and treat.      Gwen Pounds, MD  Electronically Signed     JMR/MEDQ  D:  05/25/2007  T:  05/25/2007  Job:  [id number]   cc:   Dr. Devoria Albe E. Ezzard Standing, [name].D.  Georga [name], [name].D.  Kathryne Hitch, MD

## 2010-07-25 NOTE — Consult Note (Signed)
NAME:  [name] NO.:  [id number]   MEDICAL RECORD NO.:  [id number]          PATIENT TYPE:  INP   LOCATION:  NA                           FACILITY:  [facility]   PHYSICIAN:  [name], M.D.  DATE OF BIRTH:  1934/02/12   DATE OF CONSULTATION:  DATE OF DISCHARGE:                                 CONSULTATION   HISTORY OF PRESENT ILLNESS:  [name] is a 77 year old right-handed  white male, born on 09/04/1933 with a history of severe multilevel  spinal stenosis with spondylolisthesis at L4-5 level as well.  This  patient has had syndrome of pseudoclaudication for 2-3 years.  The  patient claims the back pain is minimal while sitting, but with standing  the back pain ensues and may worsen with walking.  The patient will have  some tingling in the right thigh at times.  The patient has, in the past  gained some improvement with epidural steroid injections, the last one  in April 2009.  The patient, however, was particularly active around  father's day.  By August 25, 2007, the patient began having increased  problems with discomfort and pain in the back and buttocks area.  No  pain radiating down the legs.  The patient has had a gradual onset of  weakness in the legs, it has been particularly prominent in the last 2  weeks prior to this admission, necessitating the use of a walker over  the last several days.  The patient has developed weakness proximally in  the legs with iliopsoas muscles.  Has relative sparing of the quadriceps  muscles with extension in the knees on both sides, but has prominent L5  and S1 weakness with bilateral footdrops, inversion, eversion, and  weakness of the feet.  The patient denies problems controlling the  bowels over the bladder, and the patient comes in at this point for  further evaluation.  The patient reports no recent falls.  The patient  has undergone an MRI scan of the lumbosacral spine that shows multilevel  disease,  particularly prominent at the L2-3, L3-4, and L4-5 levels with  severe spinal stenosis and pronounced lateral recess stenosis  bilaterally.  The patient has undergone an MRI scan of the thoracic  spine, that has been relatively unremarkable.  On this scout film, there  has been no evidence of cervical cord compression either.  The patient  has reported no significant problems of the neck or arms.  He has no  weakness in the arms, pain down the arms.  Neurology was asked to see  this patient for further evaluation.   PAST MEDICAL HISTORY:  Significant for:  1. History of progressive lower extremity weakness with severe      multilevel spinal stenosis and lateral recess stenosis by MRI.  2. Hypertension.  3. Dyslipidemia.  4. Gastroesophageal reflux disease.  5. Total knee replacement on the right x2, on the left x1.  6. Coronary artery disease, status post stenting procedure.  7. History of right inguinal hernia repair.  8. Hypothyroidism.  9. Right wrist and  left thumb fracture in the past.  10.History of degenerative arthritis, followed by Dr. Corliss Skains, on      low-dose prednisone.   ALLERGIES:  No known allergies.   CURRENT MEDICATIONS:  1. Norvasc 10 mg daily.  2. Tenormin 50 mg daily.  3. Zetia 10 mg daily.  4. Synthroid 150 mcg daily.  5. Cozaar 100 mg daily.  6. Protonix 80 mg daily.  7. Prednisone 5 mg daily.  8. Crestor 20 mg daily.   SOCIAL HISTORY:  This patient is married, currently lives in the  [place] area.  The patient is retired, has 3 children  who are alive and well.  The patient does not smoke, and drinks on  average one glass of wine daily.   FAMILY MEDICAL HISTORY:  Mother died at age 80 with a cancer and  hypertension.  Father died at age 82 with a Bright disease and heart  disease.  The patient has 1 brother and 2 sisters.  Two sisters in good  health.  One brother was murdered.   REVIEW OF SYSTEMS:  Notable for a recent strep  infection involving the  face in April 2009, quite responsive to antibiotics.  The patient denies  significant neck pain, occasional crepitus of the neck, has no shortness  of breath, chest pains, and has occasional palpitations.  Denies any  abdominal pain, nausea, or vomiting.  Denies any problems controlling  the bowels or the bladder, but does report some mild constipation at  times, reduced stream with urination.  The patient denies any dizziness,  blackout episodes, but does report some occasional vertigo, which he  takes meclizine for.  The patient has noted some atrophy of the right  thigh since the knee replacements; this is chronic in nature.   PHYSICAL EXAMINATION:  VITALS:  Blood pressure is currently 113/60,  heart rate 73, respiratory rate 18, and temperature afebrile.  GENERAL:  This patient is a minimally obese white male, who is alert and  cooperative at the time of examination.  HEENT:  Head is atraumatic.  Eyes, pupils are equal, round, and react to  light.  Disks are flat bilaterally.  NECK:  Supple.  No carotid bruits noted.  RESPIRATORY:  Clear.  CARDIOVASCULAR:  Regular rate and rhythm.  No obvious murmurs or rubs  noted.  EXTREMITIES:  Notable for trace edema of the ankles bilaterally.  NEUROLOGIC:  Cranial nerves as above.  Facial symmetry is present.  The  patient has good sensation in the face to pinprick and soft touch  bilaterally.  He has good strength to facial muscles, muscles of the  head, trunk, and shoulder shrug bilaterally.  Speech is well enunciated,  not aphasic.  Again, extraocular movements are full.  Visual fields are  full.  Motor testing reveals 5/5 strength on both upper extremities.  With the lower extremities, there is clear proximal weakness involving  the iliopsoas muscles bilaterally.  The patient has good abductor  strength with both legs, mild weakness with abduction legs on both  sides.  The patient has fairly good quadriceps  strength on both sides  and hamstring strength is slightly impaired on both sides.  The patient  has prominent bilateral footdrops, weakness with inversion and eversion  of the foot, weakness with flexion and extension of the toes  bilaterally.  The patient has some depression of pinprick sensation on  the feet bilaterally, right greater than left.  No clear stocking-glove,  pinprick or sensory deficit.  No evidence of a sensory deficit on the  body with a sensory level.  The patient has impairment of vibratory  sensation, bit more prominent on the right than the left foot, to a  moderately more normal in the arms.  Pinprick sensation in the arms are  normal.  The patient has fair finger-nose-finger and had some difficulty  performing heel-to-shin due to weakness in the iliopsoas muscles.  The  patient was not ambulated.  Deep tendon reflexes are diffusely reduced  particularly in the lower extremities.  He has absent ankle jerk  reflexes and knee jerk reflexes bilaterally.  Toes appeared to be  neutral on both sides.   LABORATORY DATA:  Notable for white count of 7.8, hemoglobin of 13.1,  hematocrit 38.0, MCV of  93.7, and platelets of 270,000.  INR 0.9,  sodium 143, potassium 3.9, chloride of 110, CO2 of 27, glucose of 108,  BUN of 18, creatinine 1.24, total bili 1.2, and alk phosphatase of 66.  SGOT of 33, SGPT of 45, total protein 6.3, albumin of 4.0, and calcium  9.2.   MRI studies are as above.   IMPRESSION:  1. History of severe multilevel lumbosacral spinal stenosis and      lateral recess stenosis.  2. Progressive paraparesis as above.   This patient's current symptoms are likely related to the disease in the  lumbosacral spine.  The patient does have iliopsoas weakness by MRI,  does have disease at the L2-3 level and L3-4 levels, both with spinal  stenosis as well as lateral recess stenosis that may result in the above  symptoms.  The patient has clear fairly severe L5  and S1 weakness  bilaterally.  Again, can be explained by the lumbosacral spine.  No  evidence of spinal cord injury is seen by MRI or on clinical  examination.  We will pursue a bit further workup to rule out underlying  myopathic problem, given the fact the patient is on cholesterol-lowering  agents, but I think this is less likely.   PLAN:  1. We will check CK level and sed rate.  2. Consider outpatient EMG and nerve conduction study involving both      legs to help confirm the diagnosis.  This cannot be performed in      the hospital for insurance reasons.  The patient could potentially      benefit from physical therapy at this point, but eventually      decompression of the lumbosacral spine may be the treatment      required to improve functional outcome.      [name], M.D.  Electronically Signed     [name], M.D.  Electronically Signed    CKW/MEDQ  D:  09/16/2007  T:  09/16/2007  Job:  [id number]   cc:   [name], M.D.  Barry Dienes Eloise Harman, M.D.  [facility]

## 2010-07-25 NOTE — H&P (Signed)
NAME:  [name], [name].:  [id number]   MEDICAL RECORD NO.:  [id number]          PATIENT TYPE:  EMS   LOCATION:  MAJO                         FACILITY:  [facility]   PHYSICIAN:  Gwen Pounds, MD       DATE OF BIRTH:  03-07-1934   DATE OF ADMISSION:  05/25/2007  DATE OF DISCHARGE:                              HISTORY & PHYSICAL   PRIMARY CARE PHYSICIAN:  Dr. Jarold Motto.   EAR NOSE AND THROAT PHYSICIAN:  Kristine Garbe. Ezzard Standing, M.D.   RHEUMATOLOGIST:  Kathryne Hitch, M.D.   CARDIOLOGIST:  Georga Hacking, M.D.   CHIEF COMPLAINT:  Facial cellulitis.   HISTORY OF PRESENT ILLNESS:  A 77 year old male recently dealing with  constipation, took mag citrate, had 3 bowel movements on Friday night.  At dinner time had chills and fatigue.  He went to bed early and had  some sweats, facial swelling, nausea, weakness, temperature went up to  102-103, myalgias, nasal congestion, and nasal dryness.  He slept pretty  well and they called me yesterday.  With the myalgias and the fever, I  said that he may have the flu but if he does not look well or does not  correlate with that to please to get help at   Dictation ended at this point.      Gwen Pounds, MD     JMR/MEDQ  D:  05/25/2007  T:  05/25/2007  Job:  [id number]

## 2010-07-28 NOTE — Cardiovascular Report (Signed)
[facility]  Patient:    [name], [name]                       MRN: [id number] Proc. Date: 09/30/00 Adm. Date:  [id number] Attending:  Norman Clay CC:         Barry Dienes. Eloise Harman, M.D.   Cardiac Catheterization  INDICATIONS:  77 year old male with hypertension, hyperlipidemia, and progressive angina.  COMMENTS ABOUT PROCEDURE:  The patient was brought to the catheterization laboratory on a same day basis and was prepped and draped in the usual manner. After Xylocaine anesthesia, a 6-French sheath was placed in the right femoral artery percutaneously and angiograms were made using 6-French catheters.  He was demonstrated to have a severe stenosis in the mid to distal right coronary artery and arrangements were made to do intervention.  The sheath was exchanged for a 7-French sheath, a second IV was begun and he was given a double bolus of Integrilin with infusion.  Heparin 4500 units was administered with an ACT of 345.  Plavix 150 mg was administered orally.  Angioplasty equipment used was a 7-French JR-4 guiding catheter.  An HTF-J guide wire crossed the lesion easily.  The lesion was predilated with a 3.0 x 15 mm Maverick balloon to 6 atm.  A 23 x 3.5 mm Penta stent was deployed up to 12 atm with an excellent angiographic result.  The sheath was sutured in place and he was returned to the angioplasty holding area in stable condition.  HEMODYNAMIC DATA:  Aorta post contrast was 160/80.  LV post contrast was 160/10-12.  ANGIOGRAPHIC DATA:  LEFT VENTRICULOGRAM:  Performed in the 30 degree RAO projection.  The aortic valve was normal.  The mitral valve was normal.  The left ventricle was normal in size.  There is mild inferior basal hypokinesis noted.  The estimated ejection fraction was 60%.  CORONARY ARTERIES:  The coronary arteries arise and distribute normal.  There is very mild calcification seen in the proximal LAD and left  main coronary artery is normal.  The left anterior descending is calcified proximally.  The vessel is tortuous in the distal portion, with some segmental 40% stenosis present in the mid portion and the apex.  The circumflex contains no significant stenosis.  The right coronary artery is a large vessel.  There is a severe 99% stenosis with poststenotic dilatation in the mid to distal artery just after a small posterior descending artery.  Postdilatation angiograms reveal excellent deployment of the stent with no dissection plane.  There remains an area of mild disease proximal to the stent with a good step-up and step-down.  IMPRESSION: 1. Successful stenting of the right coronary artery with stenosis going from    99% to 0%. 2. Residual atherosclerotic disease in the mid to distal left anterior    descending. 3. Normal left ventricular function with minimal inferior basal hypokinesis. DD:  09/30/00 TD:  09/30/00 Job: [id number]

## 2010-07-28 NOTE — H&P (Signed)
NAME:  [name], [name] NO.:  [id number]   MEDICAL RECORD NO.:  [id number]                   PATIENT TYPE:  INP   LOCATION:  NA                                   FACILITY:  [facility]   PHYSICIAN:  Claude Manges. Cleophas Dunker, M.D.            DATE OF BIRTH:  Apr 10, 1933   DATE OF ADMISSION:  02/02/2003  DATE OF DISCHARGE:                                HISTORY & PHYSICAL   CHIEF COMPLAINT:  Left knee pain.   HISTORY OF PRESENT ILLNESS:  Mr. Ferger is a 77 year old white male with a  history of left knee pain since July of 2004.  He developed left knee pain  suddenly after bike-riding in late July.  The pain was severe enough at that  time that he was unable to bear weight on the knee.  The patient now is able  to ambulate on the left knee, however, still has diffuse knee pains.  The  pain is associated with standing long periods of time or walking a lot.  He  notes a decrease in strength in the left knee.  He has no mechanical  symptoms.  He describes the pain as a strong aching pain that radiates to  the midshaft of the tibia anteriorly.  The patient takes Ultracet for the  pain with very little relief.  The pain does not awaken him at night.  He  has tried cortisone injections in the left knee, however, these did not help  relieve his pain.  He has a history of right total knee arthroplasty with  revision done in 2000; the right knee is doing well, much better than prior  to surgery, however, he still has some occasional stiffness.   DRUG ALLERGIES:  Demerol causes nausea.   CURRENT MEDICINES:  1. Cozaar 100 mg daily.  2. Foltx one daily.  3. Lipitor 10 mg one nightly.  4. Rantidine 150 mg one b.i.d.  5. Diclofenac 75 mg one b.i.d.  6. Levoxyl 150 mcg one daily.  7. Atenolol 25 mg one daily.  8. Metoclopramide 10 mg one nightly.  9. Enteric-coated aspirin 81 mg one daily.  10.      Fish oil 1000 mg one b.i.d.   MEDICAL HISTORY:  1. Hypertension.  2.  Hypothyroidism.  3. GERD.  4. Osteoarthritis.  5. History of anemia.  6. History of elevated hepatic enzymes, followed by Dr. Barry Dienes. [place].  7. Coronary artery disease with a stent placed two years ago.   PAST SURGICAL HISTORY:  1. Inguinal hernia repair in 1975.  2. Right knee meniscectomy, 1980.  3. Right knee arthroscopy in 1988.  4. Right knee replacement in 1996.  5. Right knee revision in 2000.  6. Cardiac stent placement, 2002.   The only complications the patient has had with the above is nausea with  Demerol.  He has been given autologous transfusion in the past.  SOCIAL HISTORY:  Denies any tobacco use.  He drinks less than a glass of  wine per day.  He is married and has three grown children.  Primary care  physician is Dr. Dossie Arbour, who has cleared him for surgery from a  cardiovascular viewpoint.  The patient lives in a two-story home with three  steps to the usual entrance.  He is a Teacher, early years/pre.   FAMILY HISTORY:  Mother deceased at age 57 due to old age.  She had a  history of hypertension, cancer of unknown origin, Alzheimer's.  Patient's  father deceased at age 86 due to kidney failure; he had Bright's disease,  hypertension and cardiomegaly.  Otherwise, review of family history is  positive for diabetes mellitus and heart disease.   REVIEW OF SYSTEMS:  The patient denies any recent cold, fever or flu-like  symptoms.  Denies any chest pain, PND, orthopnea.  He has exertional dyspnea  he feels due to deconditioning.  He wears glasses.  He has reflux and  occasional constipation, especially with narcotics.  Seldom has any  nocturia.  Reports frequent muscle spasms, especially in the lower  extremities.   PHYSICAL EXAMINATION:  GENERAL:  The patient is a very pleasant, well-  developed, well-nourished male who walks with a limp and antalgic gait on  the left.  He has a varus deformity.  His mood and affect are appropriate.  He talks easily with  examiner.  The patient's height is 5 foot 11 inches,  weight 200 pounds.  VITALS:  Temperature 95.9 degrees Fahrenheit, pulse 78, blood pressure  128/78, respiratory rate 16.  HEENT:  Normocephalic and atraumatic, without frontal or maxillary sinus  tenderness to palpation.  Conjunctivae are pink bilaterally.  Sclerae are  nonicteric bilaterally.  PERRLA.  EOMs are intact.  There are no external  visible ear deformities.  TMs are pearly and gray bilaterally.  Nose and  nasal septum midline.  Nasal mucosa pink without polyps.  Buccal mucosa was  pink and moist.  Good dentition.  Pharynx without erythema or exudate.  Tongue and uvula are midline.  CARDIAC:  Regular rate and rhythm.  No murmurs, rubs, or gallops noted.  RESPIRATORY:  Clear to auscultation bilaterally.  No rhonchi or wheezing  noted.  NECK:  Neck supple.  Carotids are 2+ bilaterally without bruits.  No  tenderness along the cervical spine.  Full range of motion of the cervical  spine.  ABDOMEN:  Abdomen nontender.  Bowel sounds x4 quadrants.  BACK:  Back nontender to palpation over the thoracic and lumbar spine.  BREASTS, GENITOURINARY AND RECTAL:  Exams all deferred at this time.  NEUROLOGIC:  Patient is alert and oriented x3.  Strength testing against  resistance is 5/5 throughout the upper and lower extremities.  Cranial  nerves II-XII are grossly intact.  The patient has good sensation to light  touch in all lower extremities.  MUSCULOSKELETAL:  Hips:  Full range of motion.  Right knee has 0 degrees  extension, 90 degrees of flexion.  Forced flexion is painful.  There is no  tenderness along the joint line.  His wound incision is well-healed.  Valgus/varus stressing reveals no opening.  Left knee:  Flexion 0 degrees to  100 degrees.  Forced flexion is painful.  Medial joint line pain with  palpation.  Valgus/varus stress reveals no laxity.  McMurray's is non- painful.  Lower extremities non-edematous.  Dorsal pedal  pulses are 2+.  EHL  and FHL are intact.  X-RAYS:  X-rays dated October 13, 2002 of right knee show moderate-to-severe  medial compartment narrowing of the right knee.   MRI of right knee dated October 26, 2002 shows osteoarthritis with marked  articular cartilage loss in the medial compartment with subchondral edema in  the medial compartment.  There is a moderate knee effusion noted and  irregularity of the anterior horn of the medial meniscus suggests  degenerative fraying but there is no obvious tear.  Chondromalacia in the  lateral compartment in the patellofemoral joint is also noted.  Grade 1  sprain to the medial and lateral collateral ligaments are also noted.   IMPRESSION:  1. Tricompartmental osteoarthritis.  2. Right total knee arthroplasty with revision in 2000.  3. Hypertension.  4. Hypothyroidism.  5. Gastroesophageal reflux disease.  6. Osteoarthritis.  7. History of anemia.  8. History of elevated hepatic enzymes.  9. History of muscle spasms.   PLAN:  The patient is to be admitted to [facility] on February 02, 2003 and will undergo a left total knee replacement.  The patient is to  receive his fourth Procrit injection on the date of admission to [facility].      Richardean Canal, P.A.                       Claude Manges. Cleophas Dunker, M.D.    GC/MEDQ  D:  01/26/2003  T:  01/26/2003  Job:  [id number]

## 2010-07-28 NOTE — Discharge Summary (Signed)
NAMEFREDERICH, [name] NO.:  [id number]   MEDICAL RECORD NO.:  [id number]          PATIENT TYPE:  INP   LOCATION:  6738                         FACILITY:  [facility]   PHYSICIAN:  Barry Dienes. Eloise Harman, M.D.DATE OF BIRTH:  [birth year]   DATE OF ADMISSION:  05/25/2007  DATE OF DISCHARGE:  05/28/2007                               DISCHARGE SUMMARY   FINDINGS:  The patient is a 77 year old Caucasian man who presented to  the emergency room after developing chills, fatigue, facial edema, and a  temperature of 102 degrees.  He took Tylenol and Advil.  They did not  help his symptoms.  His symptoms progressed, particularly his facial  edema, so he presented to the emergency room.  In the emergency room, he  was felt to have facial cellulitis and was started on Rocephin and  vancomycin.   PAST MEDICAL HISTORY:  1. Hypertension.  2. Hyperlipidemia.  3. Gastroesophageal reflux disease.  4. Hypothyroidism.  5. Osteoarthritis.  6. Status post three total knee replacements.  7. Chronic low back pain.  8. Coronary artery disease.  9. Status post right inguinal hernia repair.   ALLERGIES:  DEMEROL.   MEDICATIONS PRIOR TO ADMISSION:  1. Prednisone 5 mg daily chronically.  2. Cozaar 100 mg daily.  3. Atenolol 50 mg daily.  4. Amlodipine 5 mg daily.  5. Crestor 20 mg daily.  6. Zetia 10 mg daily.  7. Prevacid 30 mg every day versus Nexium 40 mg every day.  8. Diclofenac 75 mg twice daily.  9. Levoxyl 150 mcg daily.  10.Actonel 30 mg weekly.  11.Aspirin 81 mg daily.  12.Fish oil 1200 mg twice daily.  13.Vitamin D 1000 units nightly.  14.Calcium plus D tablets daily.  15.Elavil 25 mg nightly.   INITIAL PHYSICAL EXAM:  VITAL SIGNS: Temperature 97.4, blood pressure  122/69, pulse 100-122, respirations 16, pulse oxygen saturation 97% on  room air. HEENT EXAM: Significant for erythema and induration of much of  the face that was slightly great on the right side than the left.   The  area was also somewhat tender.  Extraocular movements were normal and  there were no breaks in the scan.  NECK: Supple without jugular venous distention.  CHEST: Clear to auscultation.  HEART: Regular rate and rhythm.  ABDOMEN: Benign.  EXTREMITIES: Without edema.   INITIAL LABORATORY STUDIES:  Serum sodium 133, potassium 3.8, chloride  102, bicarbonate 21, BUN 21, creatinine 1.6, and glucose 123.  White  blood cell count 11.8 and hemoglobin 12.7.  CT scan of the face showed  nonspecific periorbital edema with no frank abscess.   HOSPITAL COURSE:  The patient was continued on broad-spectrum  antibiotics.  He was seen by the Infectious Disease consultant who felt  that streptococcus cellulitis was most likely.  A otolaryngologist also  evaluated him and felt that he had minimal ethmoid sinus disease with no  evidence of extension of infection.  He continued to be afebrile  throughout his hospital stay.  Gradually, his facial erythema improved.   PROCEDURES:  CT scan of the head.  COMPLICATIONS:  None.   CONDITION ON DISCHARGE:  He had mild tenderness in the areas of the  greatly reduced facial rash.  He was sleeping well.  Most recent vital  signs include blood pressure 133/91, pulse 98, respirations 20, and  temperature 97.8.  The areas of induration on the bilateral face were  greatly reduced in size and there was no evidence of fluctuance.   DISCHARGE DIAGNOSES:  1. Facial cellulitis.  2. 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  3. Hypertension.  4. Hyperlipidemia.  5. Gastroesophageal reflux disease.  6. Severe osteoarthritis of the lumbar spine.  7. Hypothyroidism.  8. Osteoporosis  9. Vitamin D deficiency.   DISCHARGE MEDICATIONS:  1. Augmentin 875 mg p.o. b.i.d. for 10 days.  2. Florastor 250 mg caps twice daily for 14 days.  3. Prednisone 5 mg 2 tablets daily for 3 days then 1 tablet daily.  4. Cozaar 100 mg daily.  5.  Atenolol 50 mg daily.  6. Crestor 20 mg daily.  7. Zetia 10 mg daily.  8. Prevacid 30 mg daily.  9. Nexium 40 mg daily.  10.Diclofenac 75 mg twice daily.  11.Levoxyl 150 mcg daily.  12.Actonel 30 mg once weekly.  13.Aspirin 81 mg daily.  14.Fish oil 1200 mg twice daily.  15.Vitamin D 1000 units p.o. nightly.  16.Calcium plus vitamin D tablets once daily.  17.Elavil 25 mg nightly.   FOLLOW UP PLANS:  He is to see Dr. Jarome Matin at [facility] on May 30, 2007.  He was advised to schedule a followup  appoint with Dr. Johny Sax in the Infectious Diseases Clinic in  approximately 1 week following discharge.           ______________________________  Barry Dienes. Eloise Harman, M.D.     DGP/MEDQ  D:  07/17/2007  T:  07/17/2007  Job:  [id number]   cc:   Kristine Garbe. Ezzard Standing, M.D.  Georga Hacking, M.D.  Pollyann Savoy, M.D.

## 2010-07-28 NOTE — H&P (Signed)
[place]  Patient:    [name], [name]                         MRN: [id number] Adm. Date:  09/30/00 Attending:  Darden Palmer., M.D. CC:         Barry Dienes. Eloise Harman, M.D.   History and Physical  CHIEF COMPLAINT: For cardiac catheterization.  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  He had previously been active and was riding his bicycle, and over the past three weeks prior to admission developed progressive chest pressure with activity. He initially noted the chest pressure when riding his bicycle and it would be relieved with rest, but recurred to the point where he stopped riding his bicycle.  [place] and noted similar chest tightness and pressure on carrying his luggage upstairs, and again on returning home.  He has had no symptoms at rest.  He was seen at Dr. Worthy Flank office and noted to be mildly hypertensive, and was also begun on Norvasc, which he has not started.  He was seen in the office and because of his multiple risk factors and progressive history of chest pain was recommended to go directly to cardiac catheterization to exclude coronary artery disease as a cause for his symptoms.  PAST MEDICAL HISTORY:  1. Hypertension.  2. Hypercholesterolemia.  3. Hypothyroidism.  4. Significant osteoarthritis.  5. Reflux esophagitis.  PAST SURGICAL HISTORY:  1. Right knee replacement x 2.  2. Right inguinal hernia repair.  3. Fracture of left thumb.  ALLERGIES: None.  CURRENT MEDICATIONS:  1. Atenolol 50 mg 1-1/2 tablets q.d.  2. Levoxyl 0.025 mg q.d.  3. Zantac 150 mg b.i.d.  4. Reglan 10 mg b.i.d.  5. Voltaren 75 mg b.i.d.  6. Diazepam 7.5 mg q.h.s. for sleep.  7. Aspirin 81 mg q.d.  8. Lipitor 10 mg q.d.  9. Recently started on Norvasc 5 mg q.d.  FAMILY HISTORY: Father died at  age 4 with Brights disease.  There was a history of heart disease in the fathers side of the family.  Mother died at age 67 of hypertension and cancer.  One brother died at age 48 of trauma.  Two sisters are living, one 47 and one 99; one with hypertension.  Three sons are living.  SOCIAL HISTORY: The patient is a Teacher, early years/pre, graduated from [employer].  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  He quit smoking over 40 years ago and smoked rarely when he was young.  Drinks rare alcohol socially.  Drinks occasional caffeine.  His first wife died six years ago and he is remarried and has been with his present wife for six years.  Attends [employer].  REVIEW OF SYSTEMS: He has occasional lower extremity leg cramps.  He denies impotence, claudication, TIAs.  He does have significant reflux symptoms, treated with Zantac as well as Reglan.  Significant arthritis that limits him that involves his knees, hands, and shoulders.  The remainder of the Review Of Systems is otherwise unremarkable except as noted above.  PHYSICAL EXAMINATION:  GENERAL: Very pleasant male in no acute distress.  VITAL SIGNS: Weight 202 pounds.  Blood pressure 142/76, 138/80 standing. Pulse 78.  SKIN: Warm and dry.  HEENT: EOMI.  PERRLA.  C&S clear.  Fundi normal.  Pharynx negative.  NECK: Supple without masses, JVD, or thyromegaly.  No carotid bruits.  LUNGS: Clear to A&P.  CARDIOVASCULAR: Normal S1 and S2, no S3, S4, or murmur.  ABDOMEN: Soft, nontender.  No masses, no organomegaly.  EXTREMITIES: Femoral pulses are 2+, peripheral pulses are 2+.  There is no edema noted.  LABORATORY DATA: A 12 lead electrocardiogram is within normal limits.  Chest x-ray is normal.  Laboratories are pending at the time of this dictation.  IMPRESSION:  1. Chest discomfort suggestive of angina, which has been progressive.  2. Essential  hypertension, under treatment.  3. Hyperlipidemia, under treatment; previously under treatment with Lopid     and more recently with Lipitor.  4. Hypothyroidism, under treatment.  5. Osteoarthritis.  6. Reflux esophagitis.  RECOMMENDATIONS: The patient is brought in for elective same-day cardiac catheterization.  The procedure was discussed with the patient fully including risk of MI, death, or CVA.  In addition, risk of angioplasty of stenting including restenosis and its management as well as emergency surgery were discussed with the patient and his wife.  Alternative evaluation methods such as stress testing were discussed with the patient but it was felt in view of his progressive symptoms with exertion and already on medicines such as beta-blockers that catheterization would be the best way to proceed.DD: 09/24/00 TD:  09/25/00 Job: [id number]

## 2010-07-28 NOTE — Op Note (Signed)
[place]  Patient:    [name]                        MRN: [id number] Proc. Date: 02/16/99 Adm. Date:  [id number] Attending:  Randolm Idol                           Operative Report  PREOPERATIVE DIAGNOSIS:  Failed right total knee replacement.  POSTOPERATIVE DIAGNOSIS:  Failed right total knee replacement.  PROCEDURE:  Revision.  SURGEON:  Claude Manges. Cleophas Dunker, M.D.  ASSISTANT:  Georgena Spurling, M.D. and Jamelle Rushing, P.A.C.  ANESTHESIA:  General orotracheal.  COMPLICATIONS:  None.  COMPONENTS:  Roel Cluck PFC revision femoral and tibial components, including a #4 right femoral component with a 4 mm posterior and distal femoral  augmentation and a 4 mm posterior medial femoral augmentation with a 16 x 125 mm, 5 degree fluted femoral stem.  An oval dome 3-peg patella, a nonporous coated modular tibial tray 83 mm medial, lateral 55 mm anterior to posterior ______ #5  with a stabilized plus tibial insert 10 mm in height.  All components were secured with poly methyl methacrylate.  PROCEDURE:  With the patient comfortable on the operating table and under general orotracheal anesthesia, the right lower extremity was placed in a thigh tourniquet. The leg was then prepped with Betadine scrub and then Duraprep from the tips of the tourniquet to the ankle.  Sterile draping was performed.  The extremity was elevated and an Esmarch exsanguinated with a proximal tourniquet at 350 mmHg.  The previous longitudinal incision was utilized and by sharp dissection carried  down to subcutaneous tissue.  First layer of capsule was incised in the midline. The previous medial capsular incision was identified by the Ti-Cron sutures. These were then incised and removed revealing the knee joint.  There was a small amount of clear, yellow joint effusion.  It was sent for stat Gram stain. There were a few white cells, a few red  cells, but no organisms identified.  There was a considerable amount of synovitis.  A synovectomy was performed.  We had to release both medial and lateral gutters so that we could flex the knee and I had to do a lateral release and a minimal quadriceps snip proximal so that I could divert the patella 180 degrees and still flex the knee 90 degrees. For fear that we would avulse the tibial tubercle, a single staple was then inserted longitudinally to prevent that problem.  Synovectomy was completed in the intercondylar area, as was around the tibia. There was no obvious loosening of either component.  The femur was then removed using the small sagittal saw and the osteotomes. Then, we were able to remove it with little, if any bone loss.  Both condyles were intact, but as we removed the prosthesis, there were obvious cavities in both the medial and lateral femoral condyles expanding the width of the femur.  With a series of curets and rongeurs, the soft tissue was debrided from the cavity. Several drill holes were then made into the bones where there was thick and smooth bone.  We realized at that point, that we would not be able to use the revision LCS system and accordingly, we would have to remove the tibia.  This was also performed using the sagittal saw and small osteotomes and then using the revision instruments,  e were able to remove it with little, if any bone loss from the proximal tibia.  Further debridement was performed of synovial tissue posteriorly.  We sent specimens of the synovium to the laboratory for evidence of polyethylene wear and even for any organisms.  We also were to send the components to the [employer] company to see if there was any unusual wear pattern on the polyethylene.  The [place] [facility] system was utilized.  We hand reamed both the femur and the tibia until we had end ostial reaming using a #14 for the tibia and a #16 for the femur.   At that point, we felt that we had excellent purchase.  The depth on the femur was 125 mm and 75 mm on the tibia.  We used the appropriate guides to  make distal femoral cuts.  We did not need to cut anteriorly or posteriorly. We removed a small amount of bone on the tibia using a cutting guide to decrease the posterior inclination and allow symmetrical bone surfaces both medially and laterally.  We used a whole femoral head that had been reconstituted and allowed to remain t room temperature in saline to construct a graft that would fit the size of the defects in both the medial and lateral femoral condyles.  We supplemented this ith banked cortical/cancellous chips of bone so that we had excellent reconstitution of the cavities in both the medial and lateral femoral condyles. At that point, we  inserted the trial components and placed the knee through a full range of motion. There was no varus or valgus instability.  We felt we had excellent posterior and anterior stability.  Both MCL and lateral collateral ligaments remained intact.  We also felt that we had not significantly changed the joint line, perhaps raising 5 mm or so.  The old patella was also removed, as felt that it would not function with the PFC components and then inserted the 3-pegged button patella, placing it at the superior end of the patella as possible.  We felt we had an excellent construct  with good bone grafting and reconstitution of the femoral condyles. Accordingly, the trial components were removed, we cleaned the joint, we jet saline lavaged n antibiotic solution and then inserted each of the final components as noted above. Glue was not placed into the stems of either the tibia or the femur.  We did apply a patella clamp.  The knee was placed in full extension with compression and extraneous methacrylate was removed.  When the methacrylate had hardened, the patella clamp was removed.  The  knee was placed through a full range of motion.  The patella did not sublux.  Extraneous methacrylate was removed.  We had trialed the 10 mm tibial tray and removed it and then inserted the final component as it provided excellent stability.   The wound was again irrigated with jet saline and antibiotic solution.  We actually elevated the tourniquet for 2 hours and then deflated it for over an hour and then reinflated it for approximately 35-40 minutes.  Tourniquet was again deflated, there was no significant bleeding, therefore, we did not use a Hemovac drain. he wound was again irrigated with saline antibiotic solution.  The deep capsule was closed with interrupted #1 Ti-Cron.  The subcu was closed with the 0 and 2-0 Vicryl.  The skin was closed with skin clips.  Sterile bulky dressing was applied followed by an Ace bandage and a knee immobilizer.  The patient was returned to the post anesthesia recovery room in satisfactory condition.  There was good capillary refill to the toes. DD:  02/16/99 TD:  02/17/99 Job: [id number]

## 2010-07-28 NOTE — Consult Note (Signed)
NAME:  [name], DAILY NO.:  [id number]   MEDICAL RECORD NO.:  [id number]                   PATIENT TYPE:  INP   LOCATION:  5010                                 FACILITY:  [facility]   PHYSICIAN:  Barry Dienes. Eloise Harman, M.D.            DATE OF BIRTH:  Feb 20, 1934   DATE OF CONSULTATION:  DATE OF DISCHARGE:                                   CONSULTATION   REQUESTING PHYSICIAN:  Claude Manges. Cleophas Dunker, M.D.   INDICATIONS:  Markedly elevated pulse rate.   HISTORY OF PRESENT ILLNESS:  The patient is a 77 year old white male who is  well-known to me, as I provide his primary medical care.  He is status post  a November 23rd left total knee replacement.   Yesterday, his heart rate was in the 115-126 range associated with  moderately severe left thigh and left knee pain.  He was without shortness  of breath or chest discomfort.  In addition, he had fevers yesterday that  peaked at approximately 101.  Currently, he is without significant pain and  denies shortness of breath, cough or chest pain.  He has mild constipation,  and his appetite is decreased somewhat.   PAST MEDICAL HISTORY:  Hyperlipidemia, osteoarthritis of the knees,  gastroesophageal reflux disease, hypertension, hypothyroidism, anemia,  steatohepatitis, coronary artery disease with a 2002 PTCA and stent of the  right coronary artery.   MEDICATIONS PRIOR TO ADMISSION:  1. Foltx 1 tablet p.o. daily.  2. Lipitor 10 mg p.o. daily.  3. Zantac 150 mg p.o. b.i.d.  4. Diclofenac 75 mg p.o. b.i.d.  5. Levoxyl 150 mcg p.o. daily.  6. Atenolol 25 mg p.o. daily.  7. Reglan 10 mg p.o. q.h.s.  8. Enteric-coated aspirin 81 mg p.o. daily.  9. Fish oil 1000 mg p.o. b.i.d.  10.      Restoril 30 mg p.o. q.h.s.  11.      Cozaar 100 mg p.o. daily.  12.      Ultracet tablets as needed for pain.   ADDITIONAL CURRENT MEDICATIONS:  1. Colace 100 mg twice daily.  2. Trinsicon 1 tablet p.o. t.i.d.  3. Heparin 5000  units subcutaneous twice daily.  4. Coumadin.  5. Senna-S 1 tablet daily as needed for constipation.  6. Tylox p.r.n.  7. Robaxin p.r.n.  8. Phenergan p.r.n.  9. OxyContin 10 mg p.o. q.12h.   ALLERGIES:  Demerol has been associated with nausea.   PAST SURGICAL HISTORY:  In 1975, inguinal hernia repair.  In 1980, right  knee meniscectomy.  In 1988, right knee arthroscopy.  1996, right total knee  replacement.  2000, revision right total knee replacement.  2002, PTCA and  stent of the right coronary artery.  February 02, 2003, left total knee  replacement.   FAMILY HISTORY:  Mother died at age 43 of Alzheimer's, father died at age 23  of end-stage kidney disease. There are no close  family members with diabetes  mellitus or colon cancer.   SOCIAL HISTORY:  He is married and works as a Teacher, early years/pre.  He has 3 grown  children and no history of tobacco or alcohol abuse.   REVIEW OF SYSTEMS:  He has had fever lately with mild constipation and a  decrease in his appetite.  He is currently without shortness of breath,  cough, chest pain, significant knee pain, or nausea.   PHYSICAL EXAMINATION:  VITAL SIGNS:  Blood pressure 116/65, pulse 98,  respirations 20, temperature 99.4 (temperature maximum 101.4), pulse oxygen  saturation 97% on room air.  GENERAL:  He is a well-developed, well-nourished white male who is in no  apparent distress while lying in bed on a CPM machine.  NECK:  Without jugulovenous distention or carotid bruit.  LUNGS:  Clear to auscultation.  CARDIOVASCULAR:  Regular rate and rhythm, no murmurs, rubs or gallops.  ABDOMEN:  Normal bowel sounds with no hepatosplenomegaly or tenderness.  EXTREMITIES:  Left lower extremity was without peripheral edema.   LABORATORY DATA:  November 23rd chest x-ray showed hyperexpansion of the  lungs without acute changes. White blood cell count 7.5, hemoglobin 10,  hematocrit 29, platelet count 213,000.  Serum sodium 137, potassium 4.7,   chloride 104, CO2 30, BUN 18, creatinine 1.5, glucose 120, serum albumin  4.3.  A current EKG is not available.  Preoperatively, his EKG was normal  sinus rhythm.   IMPRESSION/PLAN:  1. Tachycardia:  This is most likely due to pain which yesterday was     moderately severe and is now much better controlled on OxyContin.  His     low grade fevers, which are not likely due to infection, are likely     contributing to his somewhat increased pulse today.  Yesterday's     additional dose of Atenolol helped to decrease his heart rate.  I plan to     stay with his usual dosing of Atenolol, as I am concerned that his blood     pressure could become too low at 50 mg daily.  2. In addition, Tylenol 650 mg twice daily will be given to help with his     postoperative fevers.  3. We will recheck a CBC in the morning to confirm the stability of his     hematocrit and white blood cell count.  4. Left knee pain:  Now well controlled and at the usual postoperative     level.  5. Given that he will likely continue Coumadin for approximately 1 month     following knee surgery, we will have     him on a proton pump inhibitor with diclofenac, upon his eventual     discharge to help prevent a peptic ulcer on Coumadin.  6. Coronary artery disease.  Stable on his current medical regimen.                                               Barry Dienes Eloise Harman, M.D.    DGP/MEDQ  D:  02/04/2003  T:  02/04/2003  Job:  [id number]   cc:   W. Ashley Royalty., M.D.  [address], [address]  [place]  [place] [address]  Fax: [phone]

## 2010-07-28 NOTE — Op Note (Signed)
NAME:  Robert, [name] NO.:  [id number]   MEDICAL RECORD NO.:  [id number]                   PATIENT TYPE:  INP   LOCATION:  2550                                 FACILITY:  [facility]   PHYSICIAN:  Claude Manges. Cleophas Dunker, M.D.            DATE OF BIRTH:  11-Dec-1933   DATE OF PROCEDURE:  02/02/2003  DATE OF DISCHARGE:                                 OPERATIVE REPORT   PREOPERATIVE DIAGNOSIS:  End-stage osteoarthritis left knee.   POSTOPERATIVE DIAGNOSIS:  End-stage osteoarthritis left knee.   PROCEDURE:  Left total knee replacement.   SURGEON:  Claude Manges. Cleophas Dunker, M.D.   ANESTHESIA:  General orotracheal.   COMPLICATIONS:  None.   COMPONENTS:  Dupuy LCS complete large femoral component, #5 rotating keel  tibial platform with a 10 mm bridging bearing, and a rotating metal-back  three-pegged patella awl was secured with polymethylmethacrylate.   DESCRIPTION OF PROCEDURE:  With the patient comfortable on the operating  table and under general orotracheal anesthesia, nursing staff inserted a  Foley catheter.  A tourniquet was then applied to the left lower extremity.  The leg was prepped with Betadine scrub and with Duraprep, a tourniquet to  the ankle, sterile draping was performed.  With the leg still elevated, it  was Esmarch exsanguinated with the proximal tourniquet at 350 mmHg.   A midline longitudinal incision was then made extending from the superior  pouch to the tibial tubercle.  Via sharp dissection, incision was carried  down to the subcutaneous tissue.  The first layer of capsule was incised in  the midline.  A medial peripatellar incision was then made through the deep  capsule. There was a clear yellow joint effusion, perhaps 30 mL in volume.   The patella was then everted 180 degrees and the knee flexed to 90 degrees.  There was a moderate amount of synovitis and synovectomy was performed.  There was a large thickened plica which was also  excised along with the  other stringy synovium.  There were moderately large osteophytes along the  medial and lateral femoral condyles. There was considerable loss of  articular cartilage at the medial femoral condyle and to a lesser extent the  medial tibial plateau.  There was diffuse chondromalacia of the patella with  some areas of complete articular cartilage loss.   Preoperatively, we had measured either a standard plus or a large femoral  component.  A large component was confirmed intraoperatively.  We also  templated either a 4 or 5 tibial tray and a #5 was measured  intraoperatively.   The initial cuts were then made on the tibia using the tibial jig.  The  femoral jig was then applied.  The 10 mm flexion gap pinned in place.  The  femoral cuts were then made followed by the valgus cut in 4 degrees.  The  final femoral cuts were then made to taper the  condyles.  We checked the  flexion and extension gaps and they were symmetrical at 10 mm.  Both ACL and  PCL were sacrificed, MCL and LCL remained intact. A 7 degree of posterior  inclination of the tibial cup __________.   Lamina spreaders were then inserted into the medial and lateral compartments  to remove medial and lateral menisci and remove any remnants of ACL and PCL,  but also remove osteophytes from the posterior femoral condyle with a 3/4  inch curved osteotome.  A medial release was performed as were tight  medially with the varus position and again flexion extension gaps felt to be  perfectly symmetrical.   Retractor was then placed behind the tibia carefully.  The #5 tibial tray  was confirmed and appropriate cuts then made to accept the #5 tibial tray.  The keep component was impacted followed by the 10 mm bridging bearing.  Trial femoral component was then applied and the knee placed through a full  range of motion.  There was no malrotation of the tibial component, there  was no opening of the varus and valgus  stress.  We had full and slight  hyperextension and very nice flexion without disturbance of the tibial tray.   The patella was then prepared by removing 12 mm of bone, leaving 13 mm of  patella thickness.  Three-peg patella jig was then applied to make the three  holes in the patella and then a trial component was then applied.  The knee  was placed in full range of motion without dislocation or subluxation of the  patella.   The trial components were removed.  The trunk was copiously irrigated with  gentle saline and antibiotic solution.  The retractor was then inserted and  each of the final components were then inserted with polymethylmethacrylate.  The first component was the tibia and methacrylate was impacted into the  tibial hole followed by the component.  The excess methacrylate was removed  with the [facility]. We irrigated the joint, cleaned out the hole within the  tibial component and the 10 mm bridging bearing was inserted.  The femoral  component was then applied with methacrylate and excess methacrylate was  removed.  The knee was placed in extension with excellent position of each  of the components.  Patella was applied with methacrylate and the patella  clamped.  After complete maturation of methacrylate, the joint was inspected  and any remaining hardened, methacrylate was removed with an osteotome.  The  joint was irrigated with jet saline antibiotic solution. The tourniquet was  deflated and any gross bleeders were Bovie coagulated.  We had a nice dry  field. The deep capsule was then closed with interrupted #1 Ethibond.  The  first layer of capsule closed with a running 0 Vicryl, the subcu with 2-0  Vicryl, skin closed with skin clips.  Sterile bulky dressing was applied  followed by the patient's support stocking.  Good capillary refill to the  toes.   The patient tolerated the procedure without complications.                                              Claude Manges.  Cleophas Dunker, M.D.    PWW/MEDQ  D:  02/02/2003  T:  02/02/2003  Job:  [id number]
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
## 2010-12-04 LAB — CBC
HCT: 31.3 — ABNORMAL LOW
HCT: 33 — ABNORMAL LOW
HCT: 36.9 — ABNORMAL LOW
Hemoglobin: 10.8 — ABNORMAL LOW
Hemoglobin: 11.6 — ABNORMAL LOW
Hemoglobin: 12.7 — ABNORMAL LOW
MCHC: 34.4
MCHC: 34.6
MCHC: 35
MCV: 92.4
MCV: 92.5
MCV: 93.1
Platelets: 153
Platelets: 162
Platelets: 176
RBC: 3.38 — ABNORMAL LOW
RBC: 3.58 — ABNORMAL LOW
RBC: 3.96 — ABNORMAL LOW
RDW: 13.2
RDW: 13.4
RDW: 14
WBC: 10.2
WBC: 11.8 — ABNORMAL HIGH
WBC: 6.1

## 2010-12-04 LAB — URINALYSIS, ROUTINE W REFLEX MICROSCOPIC
Bilirubin Urine: NEGATIVE
Glucose, UA: NEGATIVE
Hgb urine dipstick: NEGATIVE
Ketones, ur: NEGATIVE
Nitrite: NEGATIVE
Protein, ur: NEGATIVE
Specific Gravity, Urine: 1.012
Urobilinogen, UA: 1
pH: 6.5

## 2010-12-04 LAB — DIFFERENTIAL
Basophils Absolute: 0
Basophils Relative: 0
Eosinophils Absolute: 0.1
Eosinophils Relative: 1
Lymphocytes Relative: 5 — ABNORMAL LOW
Lymphs Abs: 0.5 — ABNORMAL LOW
Monocytes Absolute: 0.8
Monocytes Relative: 7
Neutro Abs: 10.4 — ABNORMAL HIGH
Neutrophils Relative %: 88 — ABNORMAL HIGH

## 2010-12-04 LAB — BASIC METABOLIC PANEL
BUN: 10
CO2: 23
Calcium: 8.4
Chloride: 107
Creatinine, Ser: 1.4
GFR calc Af Amer: >60
GFR calc non Af Amer: 50 — ABNORMAL LOW
Glucose, Bld: 112 — ABNORMAL HIGH
Potassium: 3.8
Sodium: 137

## 2010-12-04 LAB — I-STAT 8, (EC8 V) (CONVERTED LAB)
Acid-base deficit: 2
BUN: 21
Bicarbonate: 22.7
Chloride: 102
Glucose, Bld: 123 — ABNORMAL HIGH
HCT: 39
Hemoglobin: 13.3
Operator id: 285491
Potassium: 3.8
Sodium: 133 — ABNORMAL LOW
TCO2: 24
pCO2, Ven: 38.4 — ABNORMAL LOW
pH, Ven: 7.379 — ABNORMAL HIGH

## 2010-12-04 LAB — INFLUENZA A+B VIRUS AG-DIRECT(RAPID)
Inflenza A Ag: NEGATIVE
Influenza B Ag: NEGATIVE

## 2010-12-04 LAB — RAPID STREP SCREEN (MED CTR MEBANE ONLY): Streptococcus, Group A Screen (Direct): NEGATIVE

## 2010-12-04 LAB — CULTURE, RESPIRATORY: Gram Stain: NONE SEEN

## 2010-12-04 LAB — CULTURE, BLOOD (ROUTINE X 2)
Culture: NO GROWTH
Culture: NO GROWTH

## 2010-12-04 LAB — CULTURE, RESPIRATORY W GRAM STAIN

## 2010-12-04 LAB — POCT I-STAT CREATININE
Creatinine, Ser: 1.6 — ABNORMAL HIGH
Operator id: 285491

## 2010-12-07 LAB — COMPREHENSIVE METABOLIC PANEL
ALT: 45
AST: 33
Albumin: 4
Alkaline Phosphatase: 66
BUN: 18
CO2: 27
Calcium: 9.2
Chloride: 110
Creatinine, Ser: 1.24
GFR calc Af Amer: >60
GFR calc non Af Amer: 57 — ABNORMAL LOW
Glucose, Bld: 108 — ABNORMAL HIGH
Potassium: 3.9
Sodium: 143
Total Bilirubin: 1.2
Total Protein: 6.3

## 2010-12-07 LAB — URINALYSIS, ROUTINE W REFLEX MICROSCOPIC
Bilirubin Urine: NEGATIVE
Glucose, UA: NEGATIVE
Hgb urine dipstick: NEGATIVE
Ketones, ur: NEGATIVE
Nitrite: NEGATIVE
Protein, ur: NEGATIVE
Specific Gravity, Urine: 1.014
Urobilinogen, UA: 1
pH: 5.5

## 2010-12-07 LAB — CBC
HCT: 38 — ABNORMAL LOW
HCT: 38.3 — ABNORMAL LOW
Hemoglobin: 13.1
Hemoglobin: 13.5
MCHC: 34.4
MCHC: 35.3
MCV: 93.6
MCV: 93.7
Platelets: 217
Platelets: 240
RBC: 4.05 — ABNORMAL LOW
RBC: 4.09 — ABNORMAL LOW
RDW: 14.9
RDW: 15.6 — ABNORMAL HIGH
WBC: 7.8
WBC: 7.8

## 2010-12-07 LAB — DIFFERENTIAL
Basophils Absolute: 0
Basophils Relative: 0
Eosinophils Absolute: 0.1
Eosinophils Relative: 1
Lymphocytes Relative: 13
Lymphs Abs: 1
Monocytes Absolute: 0.7
Monocytes Relative: 9
Neutro Abs: 6
Neutrophils Relative %: 77

## 2010-12-07 LAB — APTT: aPTT: 32

## 2010-12-07 LAB — BASIC METABOLIC PANEL
BUN: 16
CO2: 28
Calcium: 9.9
Chloride: 104
Creatinine, Ser: 1.33
GFR calc Af Amer: >60
GFR calc non Af Amer: 53 — ABNORMAL LOW
Glucose, Bld: 116 — ABNORMAL HIGH
Potassium: 4.7
Sodium: 141

## 2010-12-07 LAB — CK TOTAL AND CKMB (NOT AT ARMC)
CK, MB: 3.5
Relative Index: 1.8
Total CK: 192

## 2010-12-07 LAB — ABO/RH: ABO/RH(D): O POS

## 2010-12-07 LAB — PROTIME-INR
INR: 0.9
Prothrombin Time: 12.8

## 2010-12-07 LAB — TYPE AND SCREEN
ABO/RH(D): O POS
Antibody Screen: NEGATIVE

## 2010-12-07 LAB — SEDIMENTATION RATE: Sed Rate: 5

## 2010-12-07 LAB — T4, FREE: Free T4: 1.5

## 2010-12-07 LAB — TSH: TSH: 0.418 (ref 0.350–4.500)

## 2011-02-26 ENCOUNTER — Other Ambulatory Visit: Payer: Self-pay | Admitting: Cardiology

## 2011-03-19 ENCOUNTER — Other Ambulatory Visit: Payer: Self-pay | Admitting: Dermatology

## 2011-03-29 ENCOUNTER — Other Ambulatory Visit: Payer: Self-pay | Admitting: Dermatology

## 2011-04-05 ENCOUNTER — Other Ambulatory Visit: Payer: Self-pay | Admitting: Neurosurgery

## 2011-04-05 DIAGNOSIS — M5137 Other intervertebral disc degeneration, lumbosacral region: Secondary | ICD-10-CM

## 2011-04-05 DIAGNOSIS — M4120 Other idiopathic scoliosis, site unspecified: Secondary | ICD-10-CM

## 2011-04-05 DIAGNOSIS — M545 Low back pain, unspecified: Secondary | ICD-10-CM

## 2011-04-05 DIAGNOSIS — M47817 Spondylosis without myelopathy or radiculopathy, lumbosacral region: Secondary | ICD-10-CM

## 2011-04-11 ENCOUNTER — Ambulatory Visit
Admission: RE | Admit: 2011-04-11 | Discharge: 2011-04-11 | Disposition: A | Discharge status: Home / Self Care | Payer: Medicare Other | Source: Ambulatory Visit | Attending: Neurosurgery | Admitting: Neurosurgery

## 2011-04-11 DIAGNOSIS — M545 Low back pain, unspecified: Secondary | ICD-10-CM

## 2011-04-11 DIAGNOSIS — M5137 Other intervertebral disc degeneration, lumbosacral region: Secondary | ICD-10-CM

## 2011-04-11 DIAGNOSIS — M47817 Spondylosis without myelopathy or radiculopathy, lumbosacral region: Secondary | ICD-10-CM

## 2011-04-11 DIAGNOSIS — M4120 Other idiopathic scoliosis, site unspecified: Secondary | ICD-10-CM

## 2011-04-11 MED ORDER — GADOBENATE DIMEGLUMINE 529 MG/ML IV SOLN
20.0000 mL | Freq: Once | INTRAVENOUS | Status: AC | PRN
  Administered 2011-04-11: 20 mL via INTRAVENOUS

## 2012-01-08 ENCOUNTER — Other Ambulatory Visit: Payer: Self-pay | Admitting: Neurosurgery

## 2012-01-08 DIAGNOSIS — M542 Cervicalgia: Secondary | ICD-10-CM

## 2012-01-10 ENCOUNTER — Ambulatory Visit
Admission: RE | Admit: 2012-01-10 | Discharge: 2012-01-10 | Disposition: A | Discharge status: Home / Self Care | Payer: Medicare Other | Source: Ambulatory Visit | Attending: Neurosurgery | Admitting: Neurosurgery

## 2012-01-10 DIAGNOSIS — M542 Cervicalgia: Secondary | ICD-10-CM

## 2012-01-14 ENCOUNTER — Other Ambulatory Visit: Payer: Self-pay | Admitting: Neurosurgery

## 2012-01-15 ENCOUNTER — Encounter (HOSPITAL_COMMUNITY): Payer: Self-pay | Admitting: Pharmacist

## 2012-01-18 ENCOUNTER — Encounter (HOSPITAL_COMMUNITY)
Admission: RE | Admit: 2012-01-18 | Discharge: 2012-01-18 | Disposition: A | Discharge status: Home / Self Care | Payer: Medicare Other | Source: Ambulatory Visit | Attending: Neurosurgery | Admitting: Neurosurgery

## 2012-01-18 ENCOUNTER — Encounter (HOSPITAL_COMMUNITY): Payer: Self-pay

## 2012-01-18 ENCOUNTER — Other Ambulatory Visit: Payer: Self-pay

## 2012-01-18 HISTORY — DX: Essential (primary) hypertension: I10

## 2012-01-18 HISTORY — DX: Unspecified osteoarthritis, unspecified site: M19.90

## 2012-01-18 HISTORY — DX: Malignant (primary) neoplasm, unspecified: C80.1

## 2012-01-18 HISTORY — DX: Dizziness and giddiness: R42

## 2012-01-18 HISTORY — DX: Gastro-esophageal reflux disease without esophagitis: K21.9

## 2012-01-18 HISTORY — DX: Pure hypercholesterolemia, unspecified: E78.00

## 2012-01-18 HISTORY — DX: Hypothyroidism, unspecified: E03.9

## 2012-01-18 LAB — CBC
HCT: 39.8 % (ref 39.0–52.0)
Hemoglobin: 13.2 g/dL (ref 13.0–17.0)
MCH: 31.8 pg (ref 26.0–34.0)
MCHC: 33.2 g/dL (ref 30.0–36.0)
MCV: 95.9 fL (ref 78.0–100.0)
Platelets: 204 K/uL (ref 150–400)
RBC: 4.15 MIL/uL — ABNORMAL LOW (ref 4.22–5.81)
RDW: 14.1 % (ref 11.5–15.5)
WBC: 8.7 K/uL (ref 4.0–10.5)

## 2012-01-18 LAB — BASIC METABOLIC PANEL
BUN: 32 mg/dL — ABNORMAL HIGH (ref 6–23)
CO2: 27 mEq/L (ref 19–32)
Calcium: 9.3 mg/dL (ref 8.4–10.5)
Chloride: 101 mEq/L (ref 96–112)
Creatinine, Ser: 1.32 mg/dL (ref 0.50–1.35)
GFR calc Af Amer: 59 mL/min — ABNORMAL LOW (ref >90)
GFR calc non Af Amer: 51 mL/min — ABNORMAL LOW (ref >90)
Glucose, Bld: 102 mg/dL — ABNORMAL HIGH (ref 70–99)
Potassium: 4.3 mEq/L (ref 3.5–5.1)
Sodium: 139 mEq/L (ref 135–145)

## 2012-01-18 LAB — SURGICAL PCR SCREEN
MRSA, PCR: NEGATIVE
Staphylococcus aureus: NEGATIVE

## 2012-01-18 NOTE — Progress Notes (Signed)
PCP is Dr. Dossie Arbour. Cardiologist is Dr. Ellwood Handler. Patient informed Nurse that he had a cardiac cath and one stent placed several years ago. Patient also had a stress test a few years ago. Records requested.

## 2012-01-18 NOTE — Progress Notes (Signed)
Darl Pikes from [facility] returned call and informed Nurse that patient did not need a type and screen as posted surgery schedule mentioned.

## 2012-01-24 NOTE — Consult Note (Signed)
Anesthesia chart review: Patient is a 78 year old male scheduled for C5-T1 posterior cervical thoracic arthrodesis with left C7-T1 laminectomy, foraminotomies and possible microdiscectomy by Dr. Newell Coral on 01/28/2012. History includes former smoker, hypothyroidism, CAD status post RCA stent 09/30/00, GERD, hypertension, hypercholesterolemia, arthritis, skin cancer.  PCP is Dr. Jarome Matin.  Cardiologist is Dr. Donnie Aho, last visit was in 01/2011.  By notes, he was doing well and one year follow-up was recommended.  His last nuclear stress test was on 07/18/06 and showed no evidence of ischemia or infarction, EF 64% with normal wall motion and normal wall thickening.    EKG on 01/18/12 showed NSR, with occasional PACs.  Overall, I think it appears stable since at least 09/23/07 ([facility]) and 01/23/11 (Dr.Tilley).  Cardiac cath on 09/2200 showed mild inferior basal hypokinesis, EF 60%. Normal left main, very mild calcification in the proximal LAD, portraits distal LAD with subsegmental 40% stenosis present in the mid-portion in the apex, no significant stenosis of the circumflex. RCA with severe 99% stenosis with poststenotic dilatation in the mid-to distal artery just after a small PDA. This was treated with successful stenting of the RCA with stenosis going from 99% to 0%.   Chest x-ray on 01/18/2012 showed no acute cardiopulmonary process.  Labs noted.  Plan T&S on arrival.  It has been one year since patient was seen by Dr. Donnie Aho, but was doing well from a CV standpoint at that time.  His EKG is stable.  No CV symptoms were documented at his PAT visit.  He will be evaluated by his assigned anesthesiologist on the day of surgery.  If no new CV symptoms then anticipate he can proceed as planned.  Anesthesiologist Dr. Katrinka Blazing agrees with this plan.  Shonna Chock, PA-C 01/24/12 [phone]

## 2012-01-27 MED ORDER — CEFAZOLIN SODIUM-DEXTROSE 2-3 GM-% IV SOLR
2.0000 g | INTRAVENOUS | Status: AC
  Administered 2012-01-28: 2 g via INTRAVENOUS

## 2012-01-28 ENCOUNTER — Inpatient Hospital Stay (HOSPITAL_COMMUNITY): Payer: Medicare Other

## 2012-01-28 ENCOUNTER — Encounter (HOSPITAL_COMMUNITY): Payer: Self-pay | Admitting: Vascular Surgery

## 2012-01-28 ENCOUNTER — Encounter (HOSPITAL_COMMUNITY)
Admission: RE | Disposition: A | Discharge status: Home / Self Care | Payer: Self-pay | Source: Ambulatory Visit | Attending: Neurosurgery

## 2012-01-28 ENCOUNTER — Inpatient Hospital Stay (HOSPITAL_COMMUNITY)
Admission: RE | Admit: 2012-01-28 | Discharge: 2012-01-30 | DRG: 472 | Disposition: A | Discharge status: Home / Self Care | Payer: Medicare Other | Source: Ambulatory Visit | Attending: Neurosurgery | Admitting: Neurosurgery

## 2012-01-28 ENCOUNTER — Inpatient Hospital Stay (HOSPITAL_COMMUNITY): Payer: Medicare Other | Admitting: Vascular Surgery

## 2012-01-28 DIAGNOSIS — E7800 Pure hypercholesterolemia, unspecified: Secondary | ICD-10-CM | POA: Diagnosis present

## 2012-01-28 DIAGNOSIS — I10 Essential (primary) hypertension: Secondary | ICD-10-CM | POA: Diagnosis present

## 2012-01-28 DIAGNOSIS — Z87891 Personal history of nicotine dependence: Secondary | ICD-10-CM

## 2012-01-28 DIAGNOSIS — Z85828 Personal history of other malignant neoplasm of skin: Secondary | ICD-10-CM

## 2012-01-28 DIAGNOSIS — Z96659 Presence of unspecified artificial knee joint: Secondary | ICD-10-CM

## 2012-01-28 DIAGNOSIS — Z7982 Long term (current) use of aspirin: Secondary | ICD-10-CM

## 2012-01-28 DIAGNOSIS — Z01812 Encounter for preprocedural laboratory examination: Secondary | ICD-10-CM

## 2012-01-28 DIAGNOSIS — M7130 Other bursal cyst, unspecified site: Principal | ICD-10-CM | POA: Diagnosis present

## 2012-01-28 DIAGNOSIS — Y831 Surgical operation with implant of artificial internal device as the cause of abnormal reaction of the patient, or of later complication, without mention of misadventure at the time of the procedure: Secondary | ICD-10-CM | POA: Diagnosis present

## 2012-01-28 DIAGNOSIS — I2510 Atherosclerotic heart disease of native coronary artery without angina pectoris: Secondary | ICD-10-CM | POA: Diagnosis present

## 2012-01-28 DIAGNOSIS — IMO0002 Reserved for concepts with insufficient information to code with codable children: Secondary | ICD-10-CM

## 2012-01-28 DIAGNOSIS — Y92009 Unspecified place in unspecified non-institutional (private) residence as the place of occurrence of the external cause: Secondary | ICD-10-CM

## 2012-01-28 DIAGNOSIS — M5030 Other cervical disc degeneration, unspecified cervical region: Secondary | ICD-10-CM | POA: Diagnosis present

## 2012-01-28 DIAGNOSIS — T84498A Other mechanical complication of other internal orthopedic devices, implants and grafts, initial encounter: Secondary | ICD-10-CM | POA: Diagnosis present

## 2012-01-28 DIAGNOSIS — E039 Hypothyroidism, unspecified: Secondary | ICD-10-CM | POA: Diagnosis present

## 2012-01-28 DIAGNOSIS — M47812 Spondylosis without myelopathy or radiculopathy, cervical region: Secondary | ICD-10-CM | POA: Diagnosis present

## 2012-01-28 DIAGNOSIS — Z79899 Other long term (current) drug therapy: Secondary | ICD-10-CM

## 2012-01-28 DIAGNOSIS — Z9861 Coronary angioplasty status: Secondary | ICD-10-CM

## 2012-01-28 DIAGNOSIS — K219 Gastro-esophageal reflux disease without esophagitis: Secondary | ICD-10-CM | POA: Diagnosis present

## 2012-01-28 HISTORY — PX: POSTERIOR CERVICAL FUSION/FORAMINOTOMY: SHX5038

## 2012-01-28 SURGERY — POSTERIOR CERVICAL FUSION/FORAMINOTOMY LEVEL 3
Anesthesia: General | Site: Spine Cervical | Laterality: Left | Wound class: Clean

## 2012-01-28 MED ORDER — POLYETHYLENE GLYCOL 3350 17 GM/SCOOP PO POWD
17.0000 g | Freq: Every day | ORAL | Status: DC | PRN
  Filled 2012-01-28: qty 255

## 2012-01-28 MED ORDER — LACTATED RINGERS IV SOLN
INTRAVENOUS | Status: DC | PRN
  Administered 2012-01-28 (×3): via INTRAVENOUS

## 2012-01-28 MED ORDER — OXYCODONE HCL 5 MG/5ML PO SOLN: 5.0000 mg | Freq: Once | ORAL | Status: AC | PRN

## 2012-01-28 MED ORDER — KETOROLAC TROMETHAMINE 30 MG/ML IJ SOLN: 30.0000 mg | Freq: Once | INTRAMUSCULAR | Status: DC

## 2012-01-28 MED ORDER — DOCUSATE SODIUM 100 MG PO CAPS
100.0000 mg | ORAL_CAPSULE | Freq: Two times a day (BID) | ORAL | Status: DC
  Administered 2012-01-28 – 2012-01-30 (×4): 100 mg via ORAL
  Filled 2012-01-28 (×5): qty 1

## 2012-01-28 MED ORDER — HYDROXYZINE HCL 25 MG PO TABS: 50.0000 mg | ORAL_TABLET | ORAL | Status: DC | PRN

## 2012-01-28 MED ORDER — AMLODIPINE BESYLATE 5 MG PO TABS
5.0000 mg | ORAL_TABLET | Freq: Every day | ORAL | Status: DC
  Administered 2012-01-29 – 2012-01-30 (×2): 5 mg via ORAL
  Filled 2012-01-28 (×2): qty 1

## 2012-01-28 MED ORDER — ACETAMINOPHEN 10 MG/ML IV SOLN
1000.0000 mg | Freq: Four times a day (QID) | INTRAVENOUS | Status: AC
  Administered 2012-01-28 – 2012-01-29 (×4): 1000 mg via INTRAVENOUS
  Filled 2012-01-28 (×4): qty 100

## 2012-01-28 MED ORDER — FENTANYL CITRATE 0.05 MG/ML IJ SOLN
INTRAMUSCULAR | Status: DC | PRN
  Administered 2012-01-28 (×2): 100 ug via INTRAVENOUS
  Administered 2012-01-28: 50 ug via INTRAVENOUS
  Administered 2012-01-28: 150 ug via INTRAVENOUS
  Administered 2012-01-28: 50 ug via INTRAVENOUS

## 2012-01-28 MED ORDER — DEXAMETHASONE SODIUM PHOSPHATE 10 MG/ML IJ SOLN
INTRAMUSCULAR | Status: AC
  Administered 2012-01-28: 10 mg via INTRAVENOUS
  Filled 2012-01-28: qty 1

## 2012-01-28 MED ORDER — SODIUM CHLORIDE 0.9 % IR SOLN
Status: DC | PRN
  Administered 2012-01-28: 14:00:00

## 2012-01-28 MED ORDER — KCL IN DEXTROSE-NACL 20-5-0.45 MEQ/L-%-% IV SOLN
INTRAVENOUS | Status: DC
  Administered 2012-01-28: 22:00:00 via INTRAVENOUS
  Filled 2012-01-28 (×7): qty 1000

## 2012-01-28 MED ORDER — BISACODYL 10 MG RE SUPP
10.0000 mg | Freq: Every day | RECTAL | Status: DC | PRN
Start: 2012-01-28 — End: 2012-01-30

## 2012-01-28 MED ORDER — ONDANSETRON HCL 4 MG/2ML IJ SOLN
INTRAMUSCULAR | Status: DC | PRN
  Administered 2012-01-28: 4 mg via INTRAVENOUS

## 2012-01-28 MED ORDER — GLYCOPYRROLATE 0.2 MG/ML IJ SOLN
INTRAMUSCULAR | Status: DC | PRN
  Administered 2012-01-28: 0.4 mg via INTRAVENOUS
  Administered 2012-01-28: 0.2 mg via INTRAVENOUS

## 2012-01-28 MED ORDER — SODIUM CHLORIDE 0.9 % IJ SOLN: 3.0000 mL | INTRAMUSCULAR | Status: DC | PRN

## 2012-01-28 MED ORDER — BACITRACIN ZINC 500 UNIT/GM EX OINT
TOPICAL_OINTMENT | CUTANEOUS | Status: DC | PRN
  Administered 2012-01-28: 1 via TOPICAL

## 2012-01-28 MED ORDER — PREDNISONE 10 MG PO TABS
12.0000 mg | ORAL_TABLET | Freq: Every day | ORAL | Status: DC
  Administered 2012-01-29 – 2012-01-30 (×2): 12 mg via ORAL
  Filled 2012-01-28 (×3): qty 2

## 2012-01-28 MED ORDER — MIDAZOLAM HCL 5 MG/5ML IJ SOLN
INTRAMUSCULAR | Status: DC | PRN
  Administered 2012-01-28: 2 mg via INTRAVENOUS

## 2012-01-28 MED ORDER — TEMAZEPAM 30 MG PO CAPS: 30.0000 mg | ORAL_CAPSULE | Freq: Every day | ORAL | Status: DC

## 2012-01-28 MED ORDER — KETOROLAC TROMETHAMINE 30 MG/ML IJ SOLN
30.0000 mg | Freq: Four times a day (QID) | INTRAMUSCULAR | Status: DC
  Administered 2012-01-28 – 2012-01-30 (×6): 30 mg via INTRAVENOUS
  Filled 2012-01-28 (×9): qty 1

## 2012-01-28 MED ORDER — EZETIMIBE 10 MG PO TABS
10.0000 mg | ORAL_TABLET | Freq: Every day | ORAL | Status: DC
  Administered 2012-01-28 – 2012-01-29 (×2): 10 mg via ORAL
  Filled 2012-01-28 (×3): qty 1

## 2012-01-28 MED ORDER — MENTHOL 3 MG MT LOZG: 1.0000 | LOZENGE | OROMUCOSAL | Status: DC | PRN

## 2012-01-28 MED ORDER — LOSARTAN POTASSIUM 50 MG PO TABS
100.0000 mg | ORAL_TABLET | Freq: Every day | ORAL | Status: DC
  Administered 2012-01-28 – 2012-01-30 (×3): 100 mg via ORAL
  Filled 2012-01-28 (×3): qty 2

## 2012-01-28 MED ORDER — PROPOFOL 10 MG/ML IV BOLUS
INTRAVENOUS | Status: DC | PRN
  Administered 2012-01-28: 150 mg via INTRAVENOUS

## 2012-01-28 MED ORDER — ACETAMINOPHEN 650 MG RE SUPP: 650.0000 mg | RECTAL | Status: DC | PRN

## 2012-01-28 MED ORDER — HYDROMORPHONE HCL PF 1 MG/ML IJ SOLN
INTRAMUSCULAR | Status: AC
  Filled 2012-01-28: qty 2

## 2012-01-28 MED ORDER — SODIUM CHLORIDE 0.9 % IJ SOLN
3.0000 mL | Freq: Two times a day (BID) | INTRAMUSCULAR | Status: DC
  Administered 2012-01-29 (×2): 3 mL via INTRAVENOUS

## 2012-01-28 MED ORDER — BACITRACIN 50000 UNITS IM SOLR
INTRAMUSCULAR | Status: AC
  Filled 2012-01-28: qty 1

## 2012-01-28 MED ORDER — PREDNISONE 10 MG PO TABS: 10.0000 mg | ORAL_TABLET | Freq: Every day | ORAL | Status: DC

## 2012-01-28 MED ORDER — LIDOCAINE-EPINEPHRINE 1 %-1:100000 IJ SOLN
INTRAMUSCULAR | Status: DC | PRN
  Administered 2012-01-28: 20 mL

## 2012-01-28 MED ORDER — SENNA 8.6 MG PO TABS
1.0000 | ORAL_TABLET | Freq: Two times a day (BID) | ORAL | Status: DC
  Administered 2012-01-28 – 2012-01-30 (×4): 8.6 mg via ORAL
  Filled 2012-01-28 (×2): qty 2
  Filled 2012-01-28: qty 1
  Filled 2012-01-28 (×2): qty 2

## 2012-01-28 MED ORDER — HYDROCHLOROTHIAZIDE 25 MG PO TABS
25.0000 mg | ORAL_TABLET | Freq: Every day | ORAL | Status: DC
  Administered 2012-01-28 – 2012-01-30 (×3): 25 mg via ORAL
  Filled 2012-01-28 (×3): qty 1

## 2012-01-28 MED ORDER — MORPHINE SULFATE 4 MG/ML IJ SOLN: 4.0000 mg | INTRAMUSCULAR | Status: DC | PRN

## 2012-01-28 MED ORDER — ATORVASTATIN CALCIUM 40 MG PO TABS
40.0000 mg | ORAL_TABLET | Freq: Every day | ORAL | Status: DC
  Administered 2012-01-28 – 2012-01-29 (×2): 40 mg via ORAL
  Filled 2012-01-28 (×3): qty 1

## 2012-01-28 MED ORDER — ONDANSETRON HCL 4 MG/2ML IJ SOLN: 4.0000 mg | Freq: Four times a day (QID) | INTRAMUSCULAR | Status: DC | PRN

## 2012-01-28 MED ORDER — LIDOCAINE HCL (CARDIAC) 20 MG/ML IV SOLN
INTRAVENOUS | Status: DC | PRN
  Administered 2012-01-28: 60 mg via INTRAVENOUS

## 2012-01-28 MED ORDER — ALUM & MAG HYDROXIDE-SIMETH 200-200-20 MG/5ML PO SUSP: 30.0000 mL | Freq: Four times a day (QID) | ORAL | Status: DC | PRN

## 2012-01-28 MED ORDER — SODIUM CHLORIDE 0.9 % IV SOLN: 250.0000 mL | INTRAVENOUS | Status: DC

## 2012-01-28 MED ORDER — EPHEDRINE SULFATE 50 MG/ML IJ SOLN
INTRAMUSCULAR | Status: DC | PRN
  Administered 2012-01-28: 10 mg via INTRAVENOUS
  Administered 2012-01-28: 5 mg via INTRAVENOUS
  Administered 2012-01-28: 10 mg via INTRAVENOUS

## 2012-01-28 MED ORDER — PANTOPRAZOLE SODIUM 40 MG PO TBEC
40.0000 mg | DELAYED_RELEASE_TABLET | Freq: Every day | ORAL | Status: DC
  Administered 2012-01-29 – 2012-01-30 (×2): 40 mg via ORAL
  Filled 2012-01-28 (×2): qty 1

## 2012-01-28 MED ORDER — SODIUM CHLORIDE 0.9 % IV SOLN
INTRAVENOUS | Status: AC
  Filled 2012-01-28: qty 500

## 2012-01-28 MED ORDER — OXYCODONE HCL 5 MG PO TABS
ORAL_TABLET | ORAL | Status: AC
  Filled 2012-01-28: qty 1

## 2012-01-28 MED ORDER — TEMAZEPAM 15 MG PO CAPS: 15.0000 mg | ORAL_CAPSULE | Freq: Every day | ORAL | Status: DC

## 2012-01-28 MED ORDER — CYCLOBENZAPRINE HCL 10 MG PO TABS: 10.0000 mg | ORAL_TABLET | Freq: Three times a day (TID) | ORAL | Status: DC | PRN

## 2012-01-28 MED ORDER — ROCURONIUM BROMIDE 100 MG/10ML IV SOLN
INTRAVENOUS | Status: DC | PRN
  Administered 2012-01-28: 50 mg via INTRAVENOUS

## 2012-01-28 MED ORDER — MAGNESIUM HYDROXIDE 400 MG/5ML PO SUSP: 30.0000 mL | Freq: Every day | ORAL | Status: DC | PRN

## 2012-01-28 MED ORDER — PHENYLEPHRINE HCL 10 MG/ML IJ SOLN
INTRAMUSCULAR | Status: DC | PRN
  Administered 2012-01-28: 40 ug via INTRAVENOUS
  Administered 2012-01-28 (×2): 80 ug via INTRAVENOUS
  Administered 2012-01-28: 120 ug via INTRAVENOUS
  Administered 2012-01-28 (×3): 80 ug via INTRAVENOUS
  Administered 2012-01-28: 120 ug via INTRAVENOUS

## 2012-01-28 MED ORDER — THROMBIN 20000 UNITS EX SOLR
CUTANEOUS | Status: DC | PRN
  Administered 2012-01-28: 14:00:00 via TOPICAL

## 2012-01-28 MED ORDER — 0.9 % SODIUM CHLORIDE (POUR BTL) OPTIME
TOPICAL | Status: DC | PRN
  Administered 2012-01-28: 1000 mL

## 2012-01-28 MED ORDER — PHENOL 1.4 % MT LIQD: 1.0000 | OROMUCOSAL | Status: DC | PRN

## 2012-01-28 MED ORDER — NEOSTIGMINE METHYLSULFATE 1 MG/ML IJ SOLN
INTRAMUSCULAR | Status: DC | PRN
  Administered 2012-01-28: 3 mg via INTRAVENOUS

## 2012-01-28 MED ORDER — HYDROXYZINE HCL 50 MG/ML IM SOLN: 50.0000 mg | INTRAMUSCULAR | Status: DC | PRN

## 2012-01-28 MED ORDER — ATENOLOL 50 MG PO TABS
50.0000 mg | ORAL_TABLET | Freq: Every day | ORAL | Status: DC
  Administered 2012-01-29 – 2012-01-30 (×2): 50 mg via ORAL
  Filled 2012-01-28 (×2): qty 1

## 2012-01-28 MED ORDER — PREDNISONE 1 MG PO TABS
2.0000 mg | ORAL_TABLET | Freq: Every day | ORAL | Status: DC
Start: 2012-01-28 — End: 2012-01-28

## 2012-01-28 MED ORDER — OXYCODONE HCL 5 MG PO TABS
5.0000 mg | ORAL_TABLET | ORAL | Status: DC | PRN
  Administered 2012-01-28: 5 mg via ORAL
  Administered 2012-01-29 (×4): 10 mg via ORAL
  Administered 2012-01-30: 5 mg via ORAL
  Administered 2012-01-30: 10 mg via ORAL
  Filled 2012-01-28 (×2): qty 1
  Filled 2012-01-28 (×4): qty 2
  Filled 2012-01-28: qty 1
  Filled 2012-01-28: qty 2

## 2012-01-28 MED ORDER — TEMAZEPAM 15 MG PO CAPS
30.0000 mg | ORAL_CAPSULE | Freq: Every day | ORAL | Status: DC
  Administered 2012-01-28 – 2012-01-29 (×2): 30 mg via ORAL
  Filled 2012-01-28 (×2): qty 2

## 2012-01-28 MED ORDER — LEVOTHYROXINE SODIUM 150 MCG PO TABS
150.0000 ug | ORAL_TABLET | Freq: Every day | ORAL | Status: DC
  Administered 2012-01-29 – 2012-01-30 (×2): 150 ug via ORAL
  Filled 2012-01-28 (×3): qty 1

## 2012-01-28 MED ORDER — KETOROLAC TROMETHAMINE 30 MG/ML IJ SOLN
INTRAMUSCULAR | Status: AC
  Administered 2012-01-28: 30 mg
  Filled 2012-01-28: qty 1

## 2012-01-28 MED ORDER — SENNOSIDES 8.6 MG PO TABS: 1.0000 | ORAL_TABLET | Freq: Two times a day (BID) | ORAL | Status: DC

## 2012-01-28 MED ORDER — ACETAMINOPHEN 325 MG PO TABS: 650.0000 mg | ORAL_TABLET | ORAL | Status: DC | PRN

## 2012-01-28 MED ORDER — PHENYLEPHRINE HCL 10 MG/ML IJ SOLN
10.0000 mg | INTRAVENOUS | Status: DC | PRN
  Administered 2012-01-28: 50 ug/min via INTRAVENOUS

## 2012-01-28 MED ORDER — ACETAMINOPHEN 10 MG/ML IV SOLN
INTRAVENOUS | Status: AC
  Administered 2012-01-28: 1000 mg via INTRAVENOUS
  Filled 2012-01-28: qty 100

## 2012-01-28 MED ORDER — OXYCODONE HCL 5 MG PO TABS
5.0000 mg | ORAL_TABLET | Freq: Once | ORAL | Status: AC | PRN
  Administered 2012-01-28: 5 mg via ORAL

## 2012-01-28 MED ORDER — HYDROMORPHONE HCL PF 1 MG/ML IJ SOLN
0.2500 mg | INTRAMUSCULAR | Status: DC | PRN
  Administered 2012-01-28 (×2): 0.5 mg via INTRAVENOUS

## 2012-01-28 SURGICAL SUPPLY — 75 items
4.0 x22mm Screws ×2 IMPLANT
ADH SKN CLS APL DERMABOND .7 (GAUZE/BANDAGES/DRESSINGS) ×3
BAG DECANTER FOR FLEXI CONT (MISCELLANEOUS) ×2 IMPLANT
BIT DRILL NEURO 2X3.1 SFT TUCH (MISCELLANEOUS) ×1 IMPLANT
BIT DRILL WIRE PASS 1.3MM (BIT) IMPLANT
BLADE SURG 11 STRL SS (BLADE) ×1 IMPLANT
BLADE SURG ROTATE 9660 (MISCELLANEOUS) IMPLANT
BRUSH SCRUB EZ PLAIN DRY (MISCELLANEOUS) ×2 IMPLANT
CANISTER SUCTION 2500CC (MISCELLANEOUS) ×2 IMPLANT
CLOTH BEACON ORANGE TIMEOUT ST (SAFETY) ×2 IMPLANT
CONT SPEC 4OZ CLIKSEAL STRL BL (MISCELLANEOUS) ×2 IMPLANT
COVER TABLE BACK 60X90 (DRAPES) IMPLANT
DECANTER SPIKE VIAL GLASS SM (MISCELLANEOUS) ×2 IMPLANT
DERMABOND ADVANCED (GAUZE/BANDAGES/DRESSINGS) ×3
DERMABOND ADVANCED .7 DNX12 (GAUZE/BANDAGES/DRESSINGS) ×3 IMPLANT
DRAPE C-ARM 42X72 X-RAY (DRAPES) ×4 IMPLANT
DRAPE C-ARMOR (DRAPES) ×1 IMPLANT
DRAPE LAPAROTOMY 100X72 PEDS (DRAPES) ×2 IMPLANT
DRAPE MICROSCOPE LEICA (MISCELLANEOUS) ×2 IMPLANT
DRAPE POUCH INSTRU U-SHP 10X18 (DRAPES) ×2 IMPLANT
DRAPE PROXIMA HALF (DRAPES) IMPLANT
DRILL NEURO 2X3.1 SOFT TOUCH (MISCELLANEOUS) ×2
DRILL WIRE PASS 1.3MM (BIT)
DRSG EMULSION OIL 3X3 NADH (GAUZE/BANDAGES/DRESSINGS) IMPLANT
ELECT REM PT RETURN 9FT ADLT (ELECTROSURGICAL) ×2
ELECTRODE REM PT RTRN 9FT ADLT (ELECTROSURGICAL) ×1 IMPLANT
EVACUATOR 1/8 PVC DRAIN (DRAIN) IMPLANT
GAUZE SPONGE 4X4 16PLY XRAY LF (GAUZE/BANDAGES/DRESSINGS) IMPLANT
GLOVE BIO SURGEON STRL SZ8.5 (GLOVE) ×2 IMPLANT
GLOVE BIOGEL PI IND STRL 8 (GLOVE) ×1 IMPLANT
GLOVE BIOGEL PI INDICATOR 8 (GLOVE) ×3
GLOVE ECLIPSE 7.0 STRL STRAW (GLOVE) ×2 IMPLANT
GLOVE ECLIPSE 7.5 STRL STRAW (GLOVE) ×5 IMPLANT
GLOVE EXAM NITRILE LRG STRL (GLOVE) IMPLANT
GLOVE EXAM NITRILE MD LF STRL (GLOVE) ×1 IMPLANT
GLOVE EXAM NITRILE XL STR (GLOVE) IMPLANT
GLOVE EXAM NITRILE XS STR PU (GLOVE) IMPLANT
GLOVE SS BIOGEL STRL SZ 8 (GLOVE) IMPLANT
GLOVE SUPERSENSE BIOGEL SZ 8 (GLOVE) ×1
GOWN BRE IMP SLV AUR LG STRL (GOWN DISPOSABLE) IMPLANT
GOWN BRE IMP SLV AUR XL STRL (GOWN DISPOSABLE) ×3 IMPLANT
GOWN STRL REIN 2XL LVL4 (GOWN DISPOSABLE) ×1 IMPLANT
HEMOSTAT SURGICEL 2X14 (HEMOSTASIS) IMPLANT
KIT BASIN OR (CUSTOM PROCEDURE TRAY) ×2 IMPLANT
KIT INFUSE SMALL (Orthopedic Implant) ×2 IMPLANT
KIT ROOM TURNOVER OR (KITS) ×2 IMPLANT
NDL SPNL 18GX3.5 QUINCKE PK (NEEDLE) ×1 IMPLANT
NDL SPNL 22GX3.5 QUINCKE BK (NEEDLE) ×2 IMPLANT
NEEDLE SPNL 18GX3.5 QUINCKE PK (NEEDLE) ×2 IMPLANT
NEEDLE SPNL 22GX3.5 QUINCKE BK (NEEDLE) ×4 IMPLANT
NS IRRIG 1000ML POUR BTL (IV SOLUTION) ×2 IMPLANT
PACK LAMINECTOMY NEURO (CUSTOM PROCEDURE TRAY) ×2 IMPLANT
PAD ARMBOARD 7.5X6 YLW CONV (MISCELLANEOUS) ×6 IMPLANT
PIN MAYFIELD SKULL DISP (PIN) ×2 IMPLANT
ROD VUEPOINT 3.5X60 (Rod) ×2 IMPLANT
RUBBERBAND STERILE (MISCELLANEOUS) ×2 IMPLANT
SCREW MA MM 3.5X12 (Screw) ×1 IMPLANT
SCREW MA MM 3.5X14 (Screw) ×10 IMPLANT
SCREW SET THREADED (Screw) ×8 IMPLANT
SPONGE GAUZE 4X4 12PLY (GAUZE/BANDAGES/DRESSINGS) IMPLANT
SPONGE LAP 4X18 X RAY DECT (DISPOSABLE) IMPLANT
SPONGE SURGIFOAM ABS GEL 100 (HEMOSTASIS) ×2 IMPLANT
STAPLER SKIN PROX WIDE 3.9 (STAPLE) ×2 IMPLANT
STRIP BIOACTIVE VITOSS 25X100X (Neuro Prosthesis/Implant) ×1 IMPLANT
SUT ETHILON 3 0 FSL (SUTURE) IMPLANT
SUT VIC AB 0 CT1 18XCR BRD8 (SUTURE) ×1 IMPLANT
SUT VIC AB 0 CT1 8-18 (SUTURE) ×4
SUT VIC AB 2-0 CP2 18 (SUTURE) ×3 IMPLANT
SYR 20ML ECCENTRIC (SYRINGE) ×2 IMPLANT
TOWEL OR 17X24 6PK STRL BLUE (TOWEL DISPOSABLE) ×2 IMPLANT
TOWEL OR 17X26 10 PK STRL BLUE (TOWEL DISPOSABLE) ×2 IMPLANT
TRAY FOLEY CATH 14FRSI W/METER (CATHETERS) IMPLANT
UNDERPAD 30X30 INCONTINENT (UNDERPADS AND DIAPERS) ×2 IMPLANT
VUEPOINT II DRILL BIT ×2 IMPLANT
WATER STERILE IRR 1000ML POUR (IV SOLUTION) ×2 IMPLANT

## 2012-01-28 NOTE — Op Note (Signed)
01/28/2012  4:22 PM  PATIENT:  [name]  78 y.o. male  PRE-OPERATIVE DIAGNOSIS:  Left C7-T1 neural foraminal stenosis, cervical degenerative disc disease, cervical spondylosis, C5-6 pseudoarthrosis  POST-OPERATIVE DIAGNOSIS:  Left C7-T1 neural foraminal stenosis secondary to synovial cyst secondary to facet arthropathy, cervical degenerative disc disease, cervical spondylosis, C5-6 pseudoarthrosis  PROCEDURE:  Procedure(s):  POSTERIOR CERVICAL FUSION/FORAMINOTOMY 4 LEVEL:  Left C7-T1 cervical laminotomy and foraminotomy and resection of synovial cyst, with microdissection, microsurgical technique and the operating microscope, bilateral C5-T1 posterior cervical arthrodesis with Vuepoint posterior instrumentation, Vitoss BA, and infuse  SURGEON:  Surgeon(s): [name], MD Cristi Loron, MD  ASSISTANTS: Tressie Stalker, M.D.  ANESTHESIA:   general  EBL:  Total I/O In: 2300 [I.V.:2300] Out: 150 [Blood:150]  BLOOD ADMINISTERED:none  COUNT: Correct per nursing staff  DICTATION: Patient was brought to the operating room, placed under general endotracheal anesthesia. Patient was placed in a 3 pin Mayfield head holder, and turned to a prone position. The neck and upper back were prepped with Betadine soap and solution and draped in a sterile fashion. The midline was infiltrated with local anesthetic with epinephrine. The C-arm fluoroscope was used throughout the procedure for localization and visualization. The C5-T1 levels were identified, and a midline incision was made carried down to subcutaneous tissue. Bipolar cautery electrocautery used to maintain hemostasis. Dissection was carried down to the cervical fascia which was incised bilaterally and the paracervical musculature was dissected from the spinous process lamina in a subperiosteal fashion. The C5, C6, C7, and T1 spinous process lamina were identified. Dissection was carried out laterally exposing the lateral masses.  The patient had a significant degenerative cervical scoliosis. Entry points were identified for lateral mass screws bilaterally at C5, C6, and C7. A screw hole was drilled each of the lateral masses the superior, lateral, and anterior trajectory. Each was examined with the ball probe, good bony surface were found, and the posterior cortical surface was tapped, and we placed 3.5 x 14 mm polyaxial screws bilaterally at each level other than for the left C6 location where we used a 3.5 x 12 mm polyaxial screw. We then draped the operating microscope, and proceed with a left C7-T1 posterior cervical laminotomy and foraminotomy. The left C7-T1 facet joint was arthropathic. We carefully removed the ligamentum flavum, and a moderately large synovial cyst emanating from the ventral medial aspect of the left C7-T1 facet was compressing the exiting left C8 nerve root. The synovial cyst was carefully removed using microdissection microsurgical technique. We then established hemostasis with the use of Gelfoam with thrombin. Then again using the C-arm fluoroscope we identified entry points for bilateral T1 pedicle screws. Screw holes were drilled into the T1 pedicle bilaterally, examined with the ball probe, good bony surfaces were found, and then we tapped the posterior cortex. We placed 4 x 22 mm polyaxial pedicle screws bilaterally at T1. We then selected two 60 mm straight rods, one rod was cut down to a 50 mm length and use of the left side, and the other was used at a 60 mm length on the right side. Once the rod was within the screw heads, locking caps were placed, once all locking caps were in place, final tightening was performed against a counter torque. We then decorticated the lamina and spinous processes and facet joints, and packed a combination of infuse and Vitoss BA over the spinous process lamina and into the facet joints. Care was taken on the left side at  C7-T1 to avoid packing any of the material near the  laminotomy. We then proceeded with closure. The deep fascia was closed with interrupted undyed 1 Vicryl sutures, Scarpa's fascia closed with interrupted undyed 1 Vicryl sutures, subcutaneous subcuticular closed interrupted inverted 2-0 undyed Vicryl sutures, and the skin edges were approximated with Dermabond. Following surgery the patient was turned back in supine position, the 3 pin Mayfield head holder was removed, the patient is to be reversed from the anesthetic, and transferred to the recovery room.  PLAN OF CARE: Admit for overnight observation  PATIENT DISPOSITION:  PACU - hemodynamically stable.   Delay start of Pharmacological VTE agent (>24hrs) due to surgical blood loss or risk of bleeding:  yes

## 2012-01-28 NOTE — Preoperative (Signed)
Beta Blockers   Reason not to administer Beta Blockers:Not Applicable 

## 2012-01-28 NOTE — Progress Notes (Signed)
Filed Vitals:   01/28/12 1700 01/28/12 1715 01/28/12 1730 01/28/12 1752  BP:    151/69  Pulse: 72 69 71 59  Temp:    97.3 F (36.3 C)  TempSrc:      Resp: 15 16 20 18   SpO2: 92% 98% 98% 96%    Patient's fairly comfortable. Wound clean dry. No voided yet, but feels that he may need to send. Encouraged to ambulate.   Plan: Continue progress to postoperative recovery.   [name], MD 01/28/2012, 7:40 PM

## 2012-01-28 NOTE — Progress Notes (Signed)
Will not get txm DOS.Oked by Revonda Standard PA

## 2012-01-28 NOTE — H&P (Signed)
Subjective: Patient is a 78 y.o. male who is admitted for treatment of advanced multilevel degenerative changes in the cervical spine. Symptomatically he is having increasing neck pain, he describes numbness and tingling around the right ear and its adjacent scalp, he has discomfort into the upper extremities bilaterally, worse and the left upper extremity into the right upper extremity, and he has sense of weakness in the left upper extremity tingling in the left hand. He has pain to the left shoulder and arm and around the left scapula.  His history is notable for having undergone a 3 level C3-4, C4-5, and C5-6 ACDF in March of 2011. Examination reveals significant weakness and atrophy in the left hand. Workup included CT scan and MRI scan. CT shows solid fusions at C3-4 and C4-5, but nonunion and pseudoarthrosis C5-6, and significant degenerative disease and spondylosis at C6-7 and C7-T1 with near complete obliteration of the disc space at the C6-7 level. MRI scan shows a broad-based C2-3 cervical disc herniation abuts but does not significantly compressed the spinal cord. At C5-6 there is an osteophytic spur in the right C5-6 neural foramen. At C6-7 there is broad-based spondylitic disc herniation, worse the right than the left. At C7-T1 there is spondylitic discussion essential to left, along with left C7-T1 hypertrophic facet arthropathy, with encroachment ventrally and dorsally of the left C7-T1 neural foramen.  Patient is admitted now for a left C7-T1 cervical laminotomy, foraminotomy, and possible microdiscectomy, C5-T1 posterior cervical thoracic arthrodesis with posterior instrumentation and bone graft.   Past Medical History  Diagnosis Date  . Hypothyroidism   . Vertigo     hx of  . Constipation     from medications taken  . GERD (gastroesophageal reflux disease)   . Arthritis   . Cancer     skin CA removed from back  . Hypercholesteremia   . Hypertension   . Bruises easily       Past Surgical History  Procedure Date  . Tonsillectomy   . Hernia repair   . Knee arthroplasty     right knee X 2; left knee once  . Laminectomy     X 6  . Back surgery   . Eye surgery     Bilateral Cataract surgery   . Cardiac catheterization     Stent X 1  . Coronary angioplasty   . Cervix surgery     Prescriptions prior to admission  Medication Sig Dispense Refill  . alendronate (FOSAMAX) 70 MG tablet Take 70 mg by mouth Once a week. Sundays      . amLODipine (NORVASC) 5 MG tablet Take 5 mg by mouth Daily.      Marland Kitchen aspirin EC 81 MG tablet Take 81 mg by mouth at bedtime.      Marland Kitchen atenolol (TENORMIN) 50 MG tablet Take 50 mg by mouth Daily.      . calcium carbonate (OS-CAL) 600 MG TABS Take 600 mg by mouth 2 (two) times daily.      . Calcium Carbonate Antacid (TUMS PO) Take 3 tablets by mouth at bedtime as needed. For heartburn      . cholecalciferol (VITAMIN D) 1000 UNITS tablet Take 1,000 Units by mouth at bedtime.      . Coenzyme Q10 (CO Q 10) 100 MG CAPS Take 100 mg by mouth at bedtime.      . CRESTOR 20 MG tablet Take 20 mg by mouth at bedtime.      . diclofenac (VOLTAREN) 50 MG EC  tablet Take 50 mg by mouth Twice daily.      Marland Kitchen docusate sodium (COLACE) 100 MG capsule Take 100 mg by mouth 2 (two) times daily.      . hydrochlorothiazide (HYDRODIURIL) 25 MG tablet Take 25 mg by mouth Daily.      Marland Kitchen HYDROcodone-acetaminophen (NORCO) 10-325 MG per tablet Take 0.5-1 tablets by mouth 5 (five) times daily as needed. For pain      . levothyroxine (SYNTHROID, LEVOTHROID) 150 MCG tablet Take 150 mcg by mouth daily before breakfast.      . losartan (COZAAR) 100 MG tablet Take 100 mg by mouth Daily.      . Omega-3 Fatty Acids (FISH OIL) 1200 MG CAPS Take 1,200 mg by mouth 2 (two) times daily.      Marland Kitchen omeprazole (PRILOSEC) 20 MG capsule Take 20 mg by mouth Daily.      . polyethylene glycol powder (GLYCOLAX/MIRALAX) powder Take 17 g by mouth Once daily as needed. For constipation      .  predniSONE (DELTASONE) 1 MG tablet Take 2 mg by mouth daily. Takes with 10 mg to total 12 mg      . predniSONE (DELTASONE) 10 MG tablet Take 10 mg by mouth Daily. Takes with 2 mg to total 12 mg      . senna (SENOKOT) 8.6 MG tablet Take 1-2 tablets by mouth 2 (two) times daily. 2 tabs in the morning and 1 tab at bedtime      . temazepam (RESTORIL) 30 MG capsule Take 30 mg by mouth at bedtime.      Marland Kitchen ZETIA 10 MG tablet Take 10 mg by mouth at bedtime.      . Multiple Vitamin (MULTIVITAMIN WITH MINERALS) TABS Take 1 tablet by mouth at bedtime.       Allergies  Allergen Reactions  . Demerol (Meperidine) Nausea And Vomiting    History  Substance Use Topics  . Smoking status: Former Games developer  . Smokeless tobacco: Not on file  . Alcohol Use: Yes     Comment: rare    No family history on file.   Review of Systems A comprehensive review of systems was negative.  Objective: Vital signs in last 24 hours: Temp:  [98.1 F (36.7 C)] 98.1 F (36.7 C) (11/18 1002) Pulse Rate:  [82] 82  (11/18 1002) Resp:  [18] 18  (11/18 1002) BP: (162)/(84) 162/84 mmHg (11/18 1002) SpO2:  [97 %] 97 % (11/18 1002)  EXAM: Patient is a well-developed well-nourished white male in no acute distress. Lungs are clear to auscultation , the patient has symmetrical respiratory excursion. Heart has a regular rate and rhythm normal S1 and S2 no murmur.   Abdomen is soft nontender nondistended bowel sounds are present. Extremity examination shows no clubbing cyanosis or edema. Motor examination shows the deltoid and biceps are 5 bilaterally, as is the right triceps. However the left triceps is a subtle weakness 4+ to 5 over 5. The left intrinsics are 4 minus to 4, with atrophy of the intrinsic musculature, the right intrinsics are 5 or 5. The left grip is 4 minus, the right grip is 4. Sensation is intact to pinprick throughout the digits the upper extremity. Reflexes are minimal to absent biceps, brachioradialis, triceps,  quadriceps, and gastrocnemius. They're symmetrical bilaterally. Toes are downgoing. Gait and stance shows he is a single-point cane to give him support.  Data Review:CBC    Component Value Date/Time   WBC 8.7 01/18/2012 1530   RBC  4.15* 01/18/2012 1530   HGB 13.2 01/18/2012 1530   HCT 39.8 01/18/2012 1530   PLT 204 01/18/2012 1530   MCV 95.9 01/18/2012 1530   MCH 31.8 01/18/2012 1530   MCHC 33.2 01/18/2012 1530   RDW 14.1 01/18/2012 1530   LYMPHSABS 1.0 09/15/2007 2330   MONOABS 0.7 09/15/2007 2330   EOSABS 0.1 09/15/2007 2330   BASOSABS 0.0 09/15/2007 2330                          BMET    Component Value Date/Time   NA 139 01/18/2012 1530   K 4.3 01/18/2012 1530   CL 101 01/18/2012 1530   CO2 27 01/18/2012 1530   GLUCOSE 102* 01/18/2012 1530   BUN 32* 01/18/2012 1530   CREATININE 1.32 01/18/2012 1530   CALCIUM 9.3 01/18/2012 1530   GFRNONAA 51* 01/18/2012 1530   GFRAA 59* 01/18/2012 1530     Assessment/Plan: Patient with increasing neck and bile cervical radicular symptoms, with particular weakness in the left hand. He's had a previous C34, C4-5, and C5-6 ACDF. He has solid fusions at C3-4 and C4-5, but nonunion and pseudoarthrosis C5-6. He has advanced degenerative changes at C6-7 and C7-T1, with particular left-sided neural encroachment at C7-T1. He is admitted for left C7-T1 posterior cervical laminotomy, foraminotomy, and possible microdiscectomy, and a C5-T1 posterior cervical arthrodesis, with posterior instrumentation and bone graft.  I've discussed with the patient the nature of his condition, the nature the surgical procedure, the typical length of surgery, hospital stay, and overall recuperation. We discussed limitations postoperatively. I discussed risks of surgery including risks of infection, bleeding, possibly need for transfusion, the risk of nerve root dysfunction with pain, weakness, numbness, or paresthesias, the risk of spinal cord dysfunction with paralysis of all 4 limbs and  quadriplegia, and the risk of dural tear and CSF leakage and possible need for further surgery, myocardial infarction, stroke, pneumonia, and death. We also discussed the need for postoperative immobilization in a cervical collar. Understanding all this the patient does wish to proceed with surgery and is admitted for such.    [name], MD 01/28/2012 12:53 PM

## 2012-01-28 NOTE — Transfer of Care (Signed)
Immediate Anesthesia Transfer of Care Note  Patient: [name]  Procedure(s) Performed: Procedure(s) (LRB) with comments: POSTERIOR CERVICAL FUSION/FORAMINOTOMY LEVEL 3 (Left) - Posterior Cervical Five-Thoracic One Fusion, Arthrodesis with LEFT Cervical Seven-thoracic One Laminectomy, Foraminotomy and Resection of Synovial Cyst  Patient Location: PACU  Anesthesia Type:General  Level of Consciousness: awake, alert , oriented, patient cooperative and responds to stimulation  Airway & Oxygen Therapy: Patient Spontanous Breathing and Patient connected to nasal cannula oxygen  Post-op Assessment: Report given to PACU RN, Post -op Vital signs reviewed and stable and Patient moving all extremities X 4  Post vital signs: Reviewed and stable  Complications: No apparent anesthesia complications

## 2012-01-28 NOTE — Anesthesia Preprocedure Evaluation (Addendum)
Anesthesia Evaluation  Patient identified by MRN, date of birth, ID band Patient awake    Reviewed: Allergy & Precautions, H&P , NPO status , Patient's Chart, lab work & pertinent test results  History of Anesthesia Complications Negative for: history of anesthetic complications  Airway Mallampati: II  Neck ROM: full    Dental   Pulmonary          Cardiovascular Exercise Tolerance: Good hypertension, Pt. on home beta blockers and Pt. on medications + Cardiac Stents  ekg NSR  No recent chest pain    Neuro/Psych    GI/Hepatic GERD-  Medicated and Controlled,  Endo/Other  Hypothyroidism   Renal/GU      Musculoskeletal  (+) Arthritis -, on steriods ,    Abdominal   Peds  Hematology   Anesthesia Other Findings   Reproductive/Obstetrics                          Anesthesia Physical Anesthesia Plan  ASA: III  Anesthesia Plan: General   Post-op Pain Management:    Induction: Intravenous  Airway Management Planned: Oral ETT  Additional Equipment:   Intra-op Plan:   Post-operative Plan: Extubation in OR  Informed Consent: I have reviewed the patients History and Physical, chart, labs and discussed the procedure including the risks, benefits and alternatives for the proposed anesthesia with the patient or authorized representative who has indicated his/her understanding and acceptance.     Plan Discussed with: CRNA, Surgeon and Anesthesiologist  Anesthesia Plan Comments:        Anesthesia Quick Evaluation

## 2012-01-28 NOTE — Anesthesia Postprocedure Evaluation (Signed)
  Anesthesia Post-op Note  Patient: [name]  Procedure(s) Performed: Procedure(s) (LRB) with comments: POSTERIOR CERVICAL FUSION/FORAMINOTOMY LEVEL 3 (Left) - Posterior Cervical Five-Thoracic One Fusion, Arthrodesis with LEFT Cervical Seven-thoracic One Laminectomy, Foraminotomy and Resection of Synovial Cyst  Patient Location: PACU  Anesthesia Type:General  Level of Consciousness: awake, alert , oriented and patient cooperative  Airway and Oxygen Therapy: Patient Spontanous Breathing  Post-op Pain: mild  Post-op Assessment: Post-op Vital signs reviewed, Patient's Cardiovascular Status Stable, Respiratory Function Stable, Patent Airway, No signs of Nausea or vomiting and Pain level controlled  Post-op Vital Signs: stable  Complications: No apparent anesthesia complications

## 2012-01-28 NOTE — OR Nursing (Signed)
Dr Newell Coral notified per cell phone of need for admission/floor orders. Acknowledges will address this.

## 2012-01-29 ENCOUNTER — Encounter (HOSPITAL_COMMUNITY): Payer: Self-pay | Admitting: Neurosurgery

## 2012-01-29 NOTE — Progress Notes (Signed)
Filed Vitals:   01/28/12 2000 01/28/12 2331 01/29/12 0400 01/29/12 0640  BP: 117/73 116/69 82/43 108/63  Pulse: 76 73 86 80  Temp: 97.5 F (36.4 C) 97.6 F (36.4 C) 99 F (37.2 C)   TempSrc:      Resp: 18 18 18    SpO2: 92% 91% 98%     Patient fairly comfortable. Limited ambulation so far. Has voided some, but had significant PVR and required in and out catheterization. No voided since in and out catheterization, bladder scan at this time shows 259 cc. We'll continue to monitor voiding function. Encouraged to ambulate.  Plan: Continue progress to postoperative recovery.  [name], MD 01/29/2012, 7:52 AM

## 2012-01-29 NOTE — Progress Notes (Signed)
UR COMPLETED  

## 2012-01-30 MED ORDER — OXYCODONE-ACETAMINOPHEN 5-325 MG PO TABS: 1.0000 | ORAL_TABLET | ORAL | Status: DC | PRN

## 2012-01-30 NOTE — Progress Notes (Signed)
Pt given D/C instructions with Rx's, verbal understanding given. Pt D/C'd home via wheelchair per MD order. Sergio Zawislak, RN 

## 2012-01-30 NOTE — Discharge Summary (Signed)
Physician Discharge Summary  Patient ID: [name] MRN: [id number] DOB/AGE: 1933/08/05 78 y.o.  Admit date: 01/28/2012 Discharge date: 01/30/2012  Admission Diagnoses:  Left C7-T1 synovial cyst with resulting neuroforaminal stenosis secondary to facet arthropathy, cervical degenerative disease, cervical spondylosis, C5-6 pseudoarthrosis  Discharge Diagnoses:   Left C7-T1 synovial cyst with resulting neuroforaminal stenosis secondary to facet arthropathy, cervical degenerative disease, cervical spondylosis, C5-6 pseudoarthrosis  Discharged Condition: good  Hospital Course: Patient admitted underwent a left C7-T1 cervical laminotomy, foraminotomy, and resection of synovial cyst, and a bilateral C5-T1 posterior cervical arthrodesis with posterior instrumentation, Vitoss BA, and infuse. Postoperatively he has done well. Moderate discomfort. He's been able to steadily increase his ambulation and activity. He did have some difficulty voiding, but that has since resolved. His wound is healing nicely. He is been given instructions regarding wound care and activities following discharge. He is to return for followup with me in 3 weeks.  Discharge Exam: Blood pressure 149/83, pulse 76, temperature 98.6 F (37 C), temperature source Oral, resp. rate 18, SpO2 95.00%.  Disposition: Home     Medication List     As of 01/30/2012  2:24 PM    TAKE these medications         alendronate 70 MG tablet   Commonly known as: FOSAMAX   Take 70 mg by mouth Once a week. Sundays      amLODipine 5 MG tablet   Commonly known as: NORVASC   Take 5 mg by mouth Daily.      aspirin EC 81 MG tablet   Take 81 mg by mouth at bedtime.      atenolol 50 MG tablet   Commonly known as: TENORMIN   Take 50 mg by mouth Daily.      calcium carbonate 600 MG Tabs   Commonly known as: OS-CAL   Take 600 mg by mouth 2 (two) times daily.      cholecalciferol 1000 UNITS tablet   Commonly known as: VITAMIN D   Take  1,000 Units by mouth at bedtime.      Co Q 10 100 MG Caps   Take 100 mg by mouth at bedtime.      CRESTOR 20 MG tablet   Generic drug: rosuvastatin   Take 20 mg by mouth at bedtime.      diclofenac 50 MG EC tablet   Commonly known as: VOLTAREN   Take 50 mg by mouth Twice daily.      docusate sodium 100 MG capsule   Commonly known as: COLACE   Take 100 mg by mouth 2 (two) times daily.      Fish Oil 1200 MG Caps   Take 1,200 mg by mouth 2 (two) times daily.      hydrochlorothiazide 25 MG tablet   Commonly known as: HYDRODIURIL   Take 25 mg by mouth Daily.      HYDROcodone-acetaminophen 10-325 MG per tablet   Commonly known as: NORCO   Take 0.5-1 tablets by mouth 5 (five) times daily as needed. For pain      levothyroxine 150 MCG tablet   Commonly known as: SYNTHROID, LEVOTHROID   Take 150 mcg by mouth daily before breakfast.      losartan 100 MG tablet   Commonly known as: COZAAR   Take 100 mg by mouth Daily.      multivitamin with minerals Tabs   Take 1 tablet by mouth at bedtime.      omeprazole 20 MG capsule  Commonly known as: PRILOSEC   Take 20 mg by mouth Daily.      oxyCODONE-acetaminophen 5-325 MG per tablet   Commonly known as: PERCOCET/ROXICET   Take 1-2 tablets by mouth every 4 (four) hours as needed.      polyethylene glycol powder powder   Commonly known as: GLYCOLAX/MIRALAX   Take 17 g by mouth Once daily as needed. For constipation      predniSONE 10 MG tablet   Commonly known as: DELTASONE   Take 10 mg by mouth Daily. Takes with 2 mg to total 12 mg      predniSONE 1 MG tablet   Commonly known as: DELTASONE   Take 2 mg by mouth daily. Takes with 10 mg to total 12 mg      senna 8.6 MG tablet   Commonly known as: SENOKOT   Take 1-2 tablets by mouth 2 (two) times daily. 2 tabs in the morning and 1 tab at bedtime      temazepam 30 MG capsule   Commonly known as: RESTORIL   Take 30 mg by mouth at bedtime.      TUMS PO   Take 3 tablets by  mouth at bedtime as needed. For heartburn      ZETIA 10 MG tablet   Generic drug: ezetimibe   Take 10 mg by mouth at bedtime.         SignedHewitt Shorts, MD 01/30/2012, 2:24 PM

## 2013-01-23 ENCOUNTER — Other Ambulatory Visit: Payer: Self-pay | Admitting: Neurosurgery

## 2013-01-23 DIAGNOSIS — M4312 Spondylolisthesis, cervical region: Secondary | ICD-10-CM

## 2013-01-28 ENCOUNTER — Encounter (HOSPITAL_COMMUNITY): Payer: Self-pay | Admitting: *Deleted

## 2013-01-28 ENCOUNTER — Inpatient Hospital Stay (HOSPITAL_COMMUNITY)
Admission: EM | Admit: 2013-01-28 | Discharge: 2013-01-31 | DRG: 471 | Disposition: A | Discharge status: Home / Self Care | Payer: Medicare Other | Attending: Neurosurgery | Admitting: Neurosurgery

## 2013-01-28 ENCOUNTER — Emergency Department (HOSPITAL_COMMUNITY): Payer: Medicare Other

## 2013-01-28 DIAGNOSIS — I10 Essential (primary) hypertension: Secondary | ICD-10-CM | POA: Diagnosis present

## 2013-01-28 DIAGNOSIS — Z87891 Personal history of nicotine dependence: Secondary | ICD-10-CM

## 2013-01-28 DIAGNOSIS — G8250 Quadriplegia, unspecified: Secondary | ICD-10-CM | POA: Diagnosis present

## 2013-01-28 DIAGNOSIS — M4802 Spinal stenosis, cervical region: Principal | ICD-10-CM | POA: Diagnosis present

## 2013-01-28 DIAGNOSIS — Z981 Arthrodesis status: Secondary | ICD-10-CM

## 2013-01-28 DIAGNOSIS — Z96659 Presence of unspecified artificial knee joint: Secondary | ICD-10-CM

## 2013-01-28 DIAGNOSIS — E7800 Pure hypercholesterolemia, unspecified: Secondary | ICD-10-CM | POA: Diagnosis present

## 2013-01-28 DIAGNOSIS — K219 Gastro-esophageal reflux disease without esophagitis: Secondary | ICD-10-CM | POA: Diagnosis present

## 2013-01-28 DIAGNOSIS — Z7982 Long term (current) use of aspirin: Secondary | ICD-10-CM

## 2013-01-28 DIAGNOSIS — M129 Arthropathy, unspecified: Secondary | ICD-10-CM | POA: Diagnosis present

## 2013-01-28 DIAGNOSIS — M542 Cervicalgia: Secondary | ICD-10-CM

## 2013-01-28 DIAGNOSIS — K5900 Constipation, unspecified: Secondary | ICD-10-CM | POA: Diagnosis present

## 2013-01-28 DIAGNOSIS — E039 Hypothyroidism, unspecified: Secondary | ICD-10-CM | POA: Diagnosis present

## 2013-01-28 DIAGNOSIS — Z9861 Coronary angioplasty status: Secondary | ICD-10-CM

## 2013-01-28 HISTORY — DX: Adverse effect of unspecified anesthetic, initial encounter: T41.45XA

## 2013-01-28 HISTORY — DX: Other complications of anesthesia, initial encounter: T88.59XA

## 2013-01-28 LAB — BASIC METABOLIC PANEL
BUN: 26 mg/dL — ABNORMAL HIGH (ref 6–23)
CO2: 26 mEq/L (ref 19–32)
Calcium: 9.6 mg/dL (ref 8.4–10.5)
Chloride: 100 mEq/L (ref 96–112)
Creatinine, Ser: 1.2 mg/dL (ref 0.50–1.35)
GFR calc Af Amer: 66 mL/min — ABNORMAL LOW (ref >90)
GFR calc non Af Amer: 57 mL/min — ABNORMAL LOW (ref >90)
Glucose, Bld: 122 mg/dL — ABNORMAL HIGH (ref 70–99)
Potassium: 3.9 mEq/L (ref 3.5–5.1)
Sodium: 140 mEq/L (ref 135–145)

## 2013-01-28 LAB — CBC WITH DIFFERENTIAL/PLATELET
Basophils Absolute: 0 10*3/uL (ref 0.0–0.1)
Basophils Relative: 1 % (ref 0–1)
Eosinophils Absolute: 0.1 10*3/uL (ref 0.0–0.7)
Eosinophils Relative: 1 % (ref 0–5)
HCT: 39.3 % (ref 39.0–52.0)
Hemoglobin: 13.2 g/dL (ref 13.0–17.0)
Lymphocytes Relative: 8 % — ABNORMAL LOW (ref 12–46)
Lymphs Abs: 0.7 10*3/uL (ref 0.7–4.0)
MCH: 31.8 pg (ref 26.0–34.0)
MCHC: 33.6 g/dL (ref 30.0–36.0)
MCV: 94.7 fL (ref 78.0–100.0)
Monocytes Absolute: 0.4 10*3/uL (ref 0.1–1.0)
Monocytes Relative: 5 % (ref 3–12)
Neutro Abs: 7.5 10*3/uL (ref 1.7–7.7)
Neutrophils Relative %: 86 % — ABNORMAL HIGH (ref 43–77)
Platelets: 195 10*3/uL (ref 150–400)
RBC: 4.15 MIL/uL — ABNORMAL LOW (ref 4.22–5.81)
RDW: 14.1 % (ref 11.5–15.5)
WBC: 8.8 10*3/uL (ref 4.0–10.5)

## 2013-01-28 MED ORDER — EZETIMIBE 10 MG PO TABS
10.0000 mg | ORAL_TABLET | Freq: Every day | ORAL | Status: DC
  Administered 2013-01-28 – 2013-01-30 (×2): 10 mg via ORAL
  Filled 2013-01-28 (×4): qty 1

## 2013-01-28 MED ORDER — ONDANSETRON HCL 4 MG/2ML IJ SOLN: 4.0000 mg | Freq: Four times a day (QID) | INTRAMUSCULAR | Status: DC | PRN

## 2013-01-28 MED ORDER — PREDNISONE 10 MG PO TABS
12.0000 mg | ORAL_TABLET | Freq: Every day | ORAL | Status: DC
  Administered 2013-01-29 – 2013-01-31 (×3): 12 mg via ORAL
  Filled 2013-01-28 (×4): qty 2

## 2013-01-28 MED ORDER — AMLODIPINE BESYLATE 5 MG PO TABS
5.0000 mg | ORAL_TABLET | Freq: Every day | ORAL | Status: DC
  Administered 2013-01-29 – 2013-01-31 (×3): 5 mg via ORAL
  Filled 2013-01-28 (×3): qty 1

## 2013-01-28 MED ORDER — SENNA 8.6 MG PO TABS
1.0000 | ORAL_TABLET | Freq: Every day | ORAL | Status: DC
  Administered 2013-01-28 – 2013-01-30 (×2): 8.6 mg via ORAL
  Filled 2013-01-28 (×4): qty 1

## 2013-01-28 MED ORDER — LEVOTHYROXINE SODIUM 150 MCG PO TABS
150.0000 ug | ORAL_TABLET | Freq: Every day | ORAL | Status: DC
  Administered 2013-01-29 – 2013-01-31 (×3): 150 ug via ORAL
  Filled 2013-01-28 (×4): qty 1

## 2013-01-28 MED ORDER — ONDANSETRON HCL 4 MG PO TABS: 4.0000 mg | ORAL_TABLET | Freq: Four times a day (QID) | ORAL | Status: DC | PRN

## 2013-01-28 MED ORDER — ATENOLOL 50 MG PO TABS
50.0000 mg | ORAL_TABLET | Freq: Every day | ORAL | Status: DC
  Administered 2013-01-29 – 2013-01-31 (×3): 50 mg via ORAL
  Filled 2013-01-28 (×3): qty 1

## 2013-01-28 MED ORDER — POLYETHYLENE GLYCOL 3350 17 G PO PACK
17.0000 g | PACK | Freq: Every day | ORAL | Status: DC | PRN
  Filled 2013-01-28: qty 1

## 2013-01-28 MED ORDER — DOCUSATE SODIUM 100 MG PO CAPS
100.0000 mg | ORAL_CAPSULE | Freq: Two times a day (BID) | ORAL | Status: DC
  Administered 2013-01-28 – 2013-01-31 (×5): 100 mg via ORAL
  Filled 2013-01-28 (×7): qty 1

## 2013-01-28 MED ORDER — PREDNISONE 1 MG PO TABS: 1.0000 mg | ORAL_TABLET | Freq: Every day | ORAL | Status: DC

## 2013-01-28 MED ORDER — HYDROCHLOROTHIAZIDE 25 MG PO TABS
25.0000 mg | ORAL_TABLET | Freq: Every day | ORAL | Status: DC
  Administered 2013-01-29 – 2013-01-31 (×3): 25 mg via ORAL
  Filled 2013-01-28 (×3): qty 1

## 2013-01-28 MED ORDER — VITAMIN D3 25 MCG (1000 UNIT) PO TABS
1000.0000 [IU] | ORAL_TABLET | Freq: Every day | ORAL | Status: DC
  Administered 2013-01-28 – 2013-01-30 (×2): 1000 [IU] via ORAL
  Filled 2013-01-28 (×4): qty 1

## 2013-01-28 MED ORDER — POLYETHYLENE GLYCOL 3350 17 GM/SCOOP PO POWD: 17.0000 g | Freq: Every day | ORAL | Status: DC | PRN

## 2013-01-28 MED ORDER — PREDNISONE 10 MG PO TABS
10.0000 mg | ORAL_TABLET | Freq: Every day | ORAL | Status: DC
  Filled 2013-01-28: qty 1

## 2013-01-28 MED ORDER — PANTOPRAZOLE SODIUM 40 MG PO TBEC
40.0000 mg | DELAYED_RELEASE_TABLET | Freq: Every day | ORAL | Status: DC
  Administered 2013-01-29 – 2013-01-31 (×3): 40 mg via ORAL
  Filled 2013-01-28 (×3): qty 1

## 2013-01-28 MED ORDER — HYDROCODONE-ACETAMINOPHEN 10-325 MG PO TABS
0.5000 | ORAL_TABLET | Freq: Every day | ORAL | Status: DC | PRN
  Administered 2013-01-28 – 2013-01-30 (×4): 1 via ORAL
  Filled 2013-01-28 (×4): qty 1

## 2013-01-28 MED ORDER — SENNOSIDES 8.6 MG PO TABS: 1.0000 | ORAL_TABLET | Freq: Two times a day (BID) | ORAL | Status: DC

## 2013-01-28 MED ORDER — LOSARTAN POTASSIUM 50 MG PO TABS
100.0000 mg | ORAL_TABLET | Freq: Every day | ORAL | Status: DC
  Administered 2013-01-29 – 2013-01-31 (×3): 100 mg via ORAL
  Filled 2013-01-28 (×3): qty 2

## 2013-01-28 MED ORDER — SENNA 8.6 MG PO TABS
2.0000 | ORAL_TABLET | Freq: Every day | ORAL | Status: DC
  Administered 2013-01-29 – 2013-01-31 (×3): 17.2 mg via ORAL
  Filled 2013-01-28 (×3): qty 2

## 2013-01-28 MED ORDER — ZOLPIDEM TARTRATE 5 MG PO TABS: 5.0000 mg | ORAL_TABLET | Freq: Every evening | ORAL | Status: DC | PRN

## 2013-01-28 MED ORDER — HYDROCODONE-ACETAMINOPHEN 10-325 MG PO TABS
1.0000 | ORAL_TABLET | Freq: Once | ORAL | Status: AC
  Administered 2013-01-28: 1 via ORAL
  Filled 2013-01-28: qty 1

## 2013-01-28 MED ORDER — NITROGLYCERIN 0.4 MG SL SUBL: 0.4000 mg | SUBLINGUAL_TABLET | SUBLINGUAL | Status: DC | PRN

## 2013-01-28 NOTE — Progress Notes (Addendum)
ED CM consulted about patient request for renting w/c. Pt presented to ED with chronic back and neck pain presents with progressive numbness and weakness of his upper extremities. In room to speak with patient, wife at bedside she reports that husband is unable to ambulate due to chronic back pain and weakness. She stated that he could not walk out of the bedroom to the ambulance stretcher they had to transfer him from bed to stretcter. She voiced that she is concern how she will be able to wheel him around at home in a wheel chair she does not think she can manage.  Pt lives at home with his wife, He has Medicare and BCBS supplement. Discussed the option of HH and the services provided, Choice given with list provided.  wife stated that he has had services in the past with [facility] and would use them again. Wife states, pt has a walker at home.  Discussed possible disposition plan with Dr. Rayford Halsted. Pt still being evaluated, CM will continue to follow for a  plan.

## 2013-01-28 NOTE — Progress Notes (Addendum)
ED CM spoke with Dr. Rayford Halsted based on MRI results Dr. Newell Coral plan to admit patient. ED CM spoke with patient and wife. Explained Unit CM will coordinated discharge needs. Pt and wife verbalized understanding.

## 2013-01-28 NOTE — ED Notes (Signed)
To ED from home via [facility] with report of neck and back pain, upper extremity numbness, pt of Dr Bettina Gavia, has had several neck surgeries, also sts increasing weakness, no new nuero deficits, A/O X4, NAD

## 2013-01-28 NOTE — ED Notes (Signed)
Neurosurgey at bedside

## 2013-01-28 NOTE — H&P (Signed)
Subjective: Patient is a 79 y.o. male who is admitted for treatment of progressive quadriparesis, secondary to cervical myelopathy secondary to cervical stenosis and spinal cord compression at the C2-C3 level.  Patient is status post a T12 to S1 decompressive lumbar laminectomy for multilevel multifactorial lumbar stenosis in July 2009, he is status post a C3-4, C4-5, and C5-6 ACDF in March of 2011, and is status post a left C7-T1 cervical laminotomy, foraminotomy, and resection of synovial cyst, and a bilateral C5-T1 posterior cervical arthrodesis in November of 2013. Patient recently has had progressively worsening weakness in the upper and lower extremity, associated with some generalized aching. He has been on long-term prednisone, prescribed by his primary physician Dr. Ivery Quale. Current dose is 11 mg each day.  Patient was evaluated in the office 5 days ago, and was found to have evidence of quadriparesis. Outpatient CT scan of the cervical spine without contrast and MRI scan of the cervical spine without contrast were requested, however today the patient felt that he was more unsteady when getting up out of bed this morning. He therefore came to the [facility] emergency room for evaluation. He was evaluated by Dr. Pricilla Loveless (EDP). At my request the CT and MRI were obtained and did confirm significant cervical spinal stenosis and spinal cord compression at the C2-C3 level.  The patient denies any bowel or bladder dysfunction. On my exam today there was increased quadriparesis as compared to my exam 5 days ago. I spoke with the patient and his wife regarding the progression of his neurologic deficit, and the findings of the CT and MRI. The patient would like to proceed with surgical decompression and stabilization promptly and therefore is admitted, with anticipation of proceeding with a C2 and C3 cervical laminectomy, and a C2-3 posterior cervical arthrodesis with posterior instrumentation  and bone graft.    Past Medical History  Diagnosis Date  . Hypothyroidism   . Vertigo     hx of  . Constipation     from medications taken  . GERD (gastroesophageal reflux disease)   . Arthritis   . Cancer     skin CA removed from back  . Hypercholesteremia   . Hypertension   . Bruises easily     Past Surgical History  Procedure Laterality Date  . Tonsillectomy    . Hernia repair    . Knee arthroplasty      right knee X 2; left knee once  . Laminectomy      X 6  . Back surgery    . Eye surgery      Bilateral Cataract surgery   . Cardiac catheterization      Stent X 1  . Coronary angioplasty    . Cervix surgery    . Posterior cervical fusion/foraminotomy  01/28/2012    Procedure: POSTERIOR CERVICAL FUSION/FORAMINOTOMY LEVEL 3;  Surgeon: [name], MD;  Location: [facility];  Service: Neurosurgery;  Laterality: Left;  Posterior Cervical Five-Thoracic One Fusion, Arthrodesis with LEFT Cervical Seven-thoracic One Laminectomy, Foraminotomy and Resection of Synovial Cyst     (Not in a hospital admission) Allergies  Allergen Reactions  . Demerol [Meperidine] Nausea And Vomiting    History  Substance Use Topics  . Smoking status: Former Games developer  . Smokeless tobacco: Not on file  . Alcohol Use: Yes     Comment: rare    No family history on file.   Review of Systems A comprehensive review of systems was negative.  Objective: Vital signs in last 24 hours: Temp:  [97.7 F (36.5 C)] 97.7 F (36.5 C) (11/19 1404) Pulse Rate:  [67-85] 72 (11/19 2030) Resp:  [16] 16 (11/19 1404) BP: (114-150)/(68-89) 141/80 mmHg (11/19 2030) SpO2:  [95 %-99 %] 97 % (11/19 2030) Weight:  [92.534 kg (204 lb)] 92.534 kg (204 lb) (11/19 1404)  EXAM: Patient is a well-developed well-nourished white male in no acute distress. Lungs are clear to auscultation , the patient has symmetrical respiratory excursion. Heart has a regular rate and rhythm normal S1 and S2 no murmur.   Abdomen  is soft nontender nondistended bowel sounds are present. Extremity examination shows no clubbing cyanosis or edema. Musculoskeletal examination shows no tenderness to palpation over the cervical spinous processes or paracervical musculature. Neurologic examination shows weakness in both the upper and lower extremities. The deltoids are 4+ bilaterally, the biceps and triceps are 4 minus to 4 bilaterally, the left intrinsics are 4 and the left grip is 4 minus, and the right intrinsics and grip are 5. The left iliopsoas is 4+ and the right iliopsoas is 4 minus, the left quadriceps is 5 and the right quadriceps is 4. The left dorsiflexor is 4+, and the left plantar flexor is 5. The right dorsiflexor is 0-1, in the right plantar flexor is 4. Sensation is intact to pinprick in the digits the upper extremity, as well as in the thighs, legs, and feet, other than for an old area of decreased pinprick in the proximal lateral right leg related to nerve injury related to his right total knee replacement. Reflex examination shows the left biceps and brachioradialis are minimal, and the right biceps and brachioradialis are 1. The triceps are 2 bilaterally, the left quadriceps is 1, the right quadriceps is absent, gastrocnemius are absent bilaterally, toes are upgoing bilaterally, more vigorously on the left, than the right. Gait and stance are both unsteady, he does use a rolling walker.  Data Review:CBC    Component Value Date/Time   WBC 8.8 01/28/2013 1630   RBC 4.15* 01/28/2013 1630   HGB 13.2 01/28/2013 1630   HCT 39.3 01/28/2013 1630   PLT 195 01/28/2013 1630   MCV 94.7 01/28/2013 1630   MCH 31.8 01/28/2013 1630   MCHC 33.6 01/28/2013 1630   RDW 14.1 01/28/2013 1630   LYMPHSABS 0.7 01/28/2013 1630   MONOABS 0.4 01/28/2013 1630   EOSABS 0.1 01/28/2013 1630   BASOSABS 0.0 01/28/2013 1630                          BMET    Component Value Date/Time   NA 140 01/28/2013 1630   K 3.9 01/28/2013 1630   CL  100 01/28/2013 1630   CO2 26 01/28/2013 1630   GLUCOSE 122* 01/28/2013 1630   BUN 26* 01/28/2013 1630   CREATININE 1.20 01/28/2013 1630   CALCIUM 9.6 01/28/2013 1630   GFRNONAA 57* 01/28/2013 1630   GFRAA 66* 01/28/2013 1630     Assessment/Plan: Patient with progressive quadriparesis secondary to cervical myelopathy secondary to cervical stenosis and spinal cord compression at the C2-C3 level. There is both ventral and dorsal compression. The motor deficit has progressed as compared to my examination 5 days ago. Patient is admitted now for preoperative workup in anticipation of surgical decompression and stabilization tomorrow. I have reviewed the patient's imaging with he and his wife in the emergency room, and discussed the nature of his condition, the indications for surgery, and the  nature the surgical procedure. We specifically discussed the C2 and C3 cervical laminectomy and C2-3 posterior cervical arthrodesis with posterior instrumentation and bone graft.  I've discussed with the patient the nature of his condition, the nature the surgical procedure, the typical length of surgery, hospital stay, and overall recuperation. We discussed limitations postoperatively. I discussed risks of surgery including risks of infection, bleeding, possibly need for transfusion, the risk of nerve root dysfunction with pain, weakness, numbness, or paresthesias, the risk of spinal cord dysfunction with paralysis of all 4 limbs and quadriplegia, and the risk of dural tear and CSF leakage and possible need for further surgery, the risk of failure of the arthrodesis and the possible need for further surgery, and the risk of anesthetic complications including myocardial infarction, stroke, pneumonia, and death. We also discussed the possible need for anterior cervical decompression, if we are not able to achieve sufficient decompression throughout posterior approach. We also discussed the need for postoperative  immobilization in a cervical collar. Understanding all this the patient does wish to proceed with surgery and is admitted for such.    [name], MD 01/28/2013 9:12 PM

## 2013-01-28 NOTE — ED Provider Notes (Signed)
CSN: [id number]     Arrival date & time 01/28/13  1358 History   First MD Initiated Contact with Patient 01/28/13 1600     Chief Complaint  Patient presents with  . Neck Injury  . Back Pain   (Consider location/radiation/quality/duration/timing/severity/associated sxs/prior Treatment) HPI Comments: 79 year old male with chronic back and neck pain presents with progressive numbness and weakness of his upper extremities. One year ago he had a repeat surgery on his neck by Dr. Newell Coral. Over the past few weeks she's been having progressive numbness in his fingertips and had an x-ray done by Dr. Newell Coral within the last week that showed some slipping of his neck. He feels like his symptoms are progressed since then. He is planned for an outpatient MRI and CT scan next week for a repeat followup visit week after that. Denies a fevers, chills, chest pain, or abdominal pain. Denies any bowel or bladder incontinence. Feels an overall sense of weakness.   Past Medical History  Diagnosis Date  . Hypothyroidism   . Vertigo     hx of  . Constipation     from medications taken  . GERD (gastroesophageal reflux disease)   . Arthritis   . Cancer     skin CA removed from back  . Hypercholesteremia   . Hypertension   . Bruises easily    Past Surgical History  Procedure Laterality Date  . Tonsillectomy    . Hernia repair    . Knee arthroplasty      right knee X 2; left knee once  . Laminectomy      X 6  . Back surgery    . Eye surgery      Bilateral Cataract surgery   . Cardiac catheterization      Stent X 1  . Coronary angioplasty    . Cervix surgery    . Posterior cervical fusion/foraminotomy  01/28/2012    Procedure: POSTERIOR CERVICAL FUSION/FORAMINOTOMY LEVEL 3;  Surgeon: [name], MD;  Location: [facility];  Service: Neurosurgery;  Laterality: Left;  Posterior Cervical Five-Thoracic One Fusion, Arthrodesis with LEFT Cervical Seven-thoracic One Laminectomy, Foraminotomy and  Resection of Synovial Cyst   No family history on file. History  Substance Use Topics  . Smoking status: Former Games developer  . Smokeless tobacco: Not on file  . Alcohol Use: Yes     Comment: rare    Review of Systems  Constitutional: Negative for fever and chills.  Respiratory: Negative for shortness of breath.   Cardiovascular: Negative for chest pain.  Gastrointestinal: Negative for vomiting and abdominal pain.  Musculoskeletal: Positive for back pain.  Neurological: Positive for weakness and numbness.  All other systems reviewed and are negative.    Allergies  Demerol  Home Medications   Current Outpatient Rx  Name  Route  Sig  Dispense  Refill  . alendronate (FOSAMAX) 70 MG tablet   Oral   Take 70 mg by mouth Once a week. Sundays           Sundays   . amLODipine (NORVASC) 5 MG tablet   Oral   Take 5 mg by mouth Daily.         Marland Kitchen aspirin EC 81 MG tablet   Oral   Take 81 mg by mouth at bedtime.         Marland Kitchen atenolol (TENORMIN) 50 MG tablet   Oral   Take 50 mg by mouth Daily.         Marland Kitchen  calcium carbonate (OS-CAL) 600 MG TABS   Oral   Take 600 mg by mouth 2 (two) times daily.         . Calcium Carbonate Antacid (TUMS PO)   Oral   Take 3 tablets by mouth at bedtime as needed. For heartburn         . cholecalciferol (VITAMIN D) 1000 UNITS tablet   Oral   Take 1,000 Units by mouth at bedtime.         . Coenzyme Q10 (CO Q 10) 100 MG CAPS   Oral   Take 100 mg by mouth at bedtime.         . CRESTOR 20 MG tablet   Oral   Take 20 mg by mouth at bedtime.         . diclofenac (VOLTAREN) 50 MG EC tablet   Oral   Take 50 mg by mouth Twice daily.         Marland Kitchen docusate sodium (COLACE) 100 MG capsule   Oral   Take 100 mg by mouth 2 (two) times daily.         . hydrochlorothiazide (HYDRODIURIL) 25 MG tablet   Oral   Take 25 mg by mouth Daily.         Marland Kitchen HYDROcodone-acetaminophen (NORCO) 10-325 MG per tablet   Oral   Take 0.5-1 tablets by  mouth 5 (five) times daily as needed. For pain         . levothyroxine (SYNTHROID, LEVOTHROID) 150 MCG tablet   Oral   Take 150 mcg by mouth daily before breakfast.         . losartan (COZAAR) 100 MG tablet   Oral   Take 100 mg by mouth Daily.         . Multiple Vitamin (MULTIVITAMIN WITH MINERALS) TABS   Oral   Take 1 tablet by mouth at bedtime.         . Omega-3 Fatty Acids (FISH OIL) 1200 MG CAPS   Oral   Take 1,200 mg by mouth 2 (two) times daily.         Marland Kitchen omeprazole (PRILOSEC) 20 MG capsule   Oral   Take 20 mg by mouth Daily.         Marland Kitchen oxyCODONE-acetaminophen (PERCOCET/ROXICET) 5-325 MG per tablet   Oral   Take 1-2 tablets by mouth every 4 (four) hours as needed.   60 tablet   0   . polyethylene glycol powder (GLYCOLAX/MIRALAX) powder   Oral   Take 17 g by mouth Once daily as needed. For constipation           for constipation   . predniSONE (DELTASONE) 1 MG tablet   Oral   Take 2 mg by mouth daily. Takes with 10 mg to total 12 mg         . predniSONE (DELTASONE) 10 MG tablet   Oral   Take 10 mg by mouth Daily. Takes with 2 mg to total 12 mg         . senna (SENOKOT) 8.6 MG tablet   Oral   Take 1-2 tablets by mouth 2 (two) times daily. 2 tabs in the morning and 1 tab at bedtime         . temazepam (RESTORIL) 30 MG capsule   Oral   Take 30 mg by mouth at bedtime.         Marland Kitchen ZETIA 10 MG tablet   Oral   Take 10  mg by mouth at bedtime.          BP 150/89  Pulse 78  Temp(Src) 97.7 F (36.5 C) (Oral)  Resp 16  Ht 5\' 7"  (1.702 m)  Wt 204 lb (92.534 kg)  BMI 31.94 kg/m2  SpO2 99% Physical Exam  Nursing note and vitals reviewed. Constitutional: He is oriented to person, place, and time. He appears well-developed and well-nourished. No distress.  HENT:  Head: Normocephalic and atraumatic.  Right Ear: External ear normal.  Left Ear: External ear normal.  Nose: Nose normal.  Eyes: Right eye exhibits no discharge. Left eye  exhibits no discharge.  Neck: Neck supple. No spinous process tenderness and no muscular tenderness present.  Cardiovascular: Normal rate, regular rhythm, normal heart sounds and intact distal pulses.   Pulmonary/Chest: Effort normal and breath sounds normal.  Abdominal: Soft. There is no tenderness.  Musculoskeletal: He exhibits no edema.  Atrophy noted to his left hand  Neurological: He is alert and oriented to person, place, and time. He has normal strength. No sensory deficit.  Skin: Skin is warm and dry.    ED Course  Procedures (including critical care time) Labs Review Labs Reviewed  CBC WITH DIFFERENTIAL - Abnormal; Notable for the following:    RBC 4.15 (*)    Neutrophils Relative % 86 (*)    Lymphocytes Relative 8 (*)    All other components within normal limits  BASIC METABOLIC PANEL - Abnormal; Notable for the following:    Glucose, Bld 122 (*)    BUN 26 (*)    GFR calc non Af Amer 57 (*)    GFR calc Af Amer 66 (*)    All other components within normal limits   Imaging Review Dg Cervical Spine Complete  01/28/2013   CLINICAL DATA:  Progressive weakness and numbness to both hands  EXAM: CERVICAL SPINE  4+ VIEWS  COMPARISON:  07/22/2012.  FINDINGS: The cervical spine is visualized to the level of T1.  The vertebral body heights are maintained. There is anterior cervical disc fusion from C3 through C6. There is 4.7 mm of anterolisthesis of C4 on C5 unchanged in the prior exam. There is posterior spinal fusion from C5 through T1. There is no hardware failure or complication. The prevertebral soft tissues are normal. There is no acute fracture . There is degenerative disc disease at C2-3 and C6-7. There is severe facet arthropathy at C2-3 and C3-4.  IMPRESSION: No acute osseous injury of the cervical spine.  Anterior cervical fusion from C3 through C6 and posterior cervical fusion from C5 through T1.   Electronically Signed   By: Elige Ko   On: 01/28/2013 17:34   Ct  Cervical Spine Wo Contrast  01/28/2013   CLINICAL DATA:  Neck pain.  Difficulty walking.  EXAM: CT CERVICAL SPINE WITHOUT CONTRAST  TECHNIQUE: Multidetector CT imaging of the cervical spine was performed without intravenous contrast. Multiplanar CT image reconstructions were also generated.  COMPARISON:  Concurrent MRI cervical spine.  FINDINGS: Status post C3-C6 ACDF with anterior plating. Screw loosening noted bilaterally at C3. Screw loosening also C4, C5, and bilateral C6. Subsidence of interbody cages at C3-4, C4-5, and C5-6. There is probable partial bony bridging at C3-4 and C4-5. Pseudarthrosis at C5-6. Severe disc space narrowing C6-7. Moderate disc space narrowing C7-T1 with 2 mm anterolisthesis. Posterior instrumentation bilateral C5 through C7 lateral mass screws and transpedicular screws at T1. Mild loosening of the posterior fusion construct screws without significant displacement. No solid  posterior bony fusion mass is evident.  At C2-C3, there is a calcified central disc extrusion associated with facet arthropathy and ligamentum flavum hold infolding. As discussed on MR, there is significant spinal stenosis at this level, estimated 4-5 mm sagittal AP diameter.  Significant bony foraminal narrowing at C3-4, left, C4-5, left, C5-6, right, C6-7 bilaterally, and to a lesser degree at C7-T1.  Intracranial vascular calcification. Carotid bifurcation atherosclerosis. Trachea midline. COPD with no definite lung nodule. No prevertebral soft tissue swelling or signs of infection. Mild pannus. Slight scoliosis convex right mid cervical region.  IMPRESSION: Calcified central disc extrusion at C2-C3,, representing adjacent segment disease, associated with posterior element hypertrophy. Significant spinal stenosis at this level with canal diameter 4-5 mm.  Status post C3-C6 ACDF and C5 through T1 posterior instrumentation.  Pseudarthrosis C5-6. Loosening of anterior and posterior hardware without significant  malposition.  Multilevel spondylosis as described.   Electronically Signed   By: Davonna Belling M.D.   On: 01/28/2013 19:36   Mr Cervical Spine Wo Contrast  01/28/2013   CLINICAL DATA:  Neck and back pain with upper extremity numbness. Increasing weakness.  EXAM: MRI CERVICAL SPINE WITHOUT CONTRAST  TECHNIQUE: Multiplanar, multisequence MR imaging was performed. No intravenous contrast was administered.  COMPARISON:  Cervical spine plain films 01/1913. MRI cervical spine 01/10/2012.  FINDINGS: There has been multilevel anterior cervical discectomy with interbody fusion at C3-4, C4-5, and C5-6 with anterior plate and screws as well as interbody bone plugs. This has been augmented with posterior fixation using lateral mass screws from C5 through T1 connected by rods. There is considerable susceptibility artifact which limits evaluation of the intraspinal contents and nerve roots, but there is no gross spinal stenosis. The presence or absence of pseudarthrosis is difficult to confirm with MR. A solid fusion anteriorly at C5-6 is not established on plain films or MR. CT cervical spine could be helpful in further evaluation.  Based on limited axial imaging, there appears to be bony foraminal narrowing at C3-4 on the left, C4-5 on the left, C5-6 bilaterally worse on the right, bilateral at C6-7, and bilateral at C7-T1. This appears grossly similar to priors.  At C2-C3, there is progression of adjacent segment disease. There is a large central disc extrusion compressing the cord from a ventral approach. Posteriorly there is significant ligamentum flavum hypertrophy and infolding compressing the cord further from posteriorly. The cord is significantly compressed with 4-5 mm AP diameter at its most narrow location. Furthermore there is early abnormal cord signal just below the area of maximal compression, at the upper C3 level.  Compared with priors, the disc extrusion and posterior element hypertrophy have progressed.   IMPRESSION: Significant worsening of central disc extrusion and ligamentum flavum hypertrophy with cord compression and abnormal cord signal. Correlate clinically for myelopathic symptoms.  Status post C3 through T1 fusion as described. Possible persistent pseudarthrosis at C5-C6. Residual bony foraminal narrowing has not progressed from 2013.   Electronically Signed   By: Davonna Belling M.D.   On: 01/28/2013 19:28    EKG Interpretation   None       MDM   1. Neck pain    Patient is neurologically intact with progressive weakness. No neck pain or recent trauma. D/w Dr. Newell Coral, recommends getting his CT and MRI now, and shows worsening stenosis with early compression. Neurosurgery will admit for OR inpatient.    Audree Camel, MD 01/28/13 2055

## 2013-01-29 ENCOUNTER — Encounter (HOSPITAL_COMMUNITY): Payer: Self-pay | Admitting: Certified Registered Nurse Anesthetist

## 2013-01-29 ENCOUNTER — Inpatient Hospital Stay (HOSPITAL_COMMUNITY): Payer: Medicare Other

## 2013-01-29 ENCOUNTER — Inpatient Hospital Stay (HOSPITAL_COMMUNITY): Payer: Medicare Other | Admitting: Certified Registered"

## 2013-01-29 ENCOUNTER — Encounter (HOSPITAL_COMMUNITY)
Admission: EM | Disposition: A | Discharge status: Home / Self Care | Payer: Self-pay | Source: Home / Self Care | Attending: Neurosurgery

## 2013-01-29 ENCOUNTER — Encounter (HOSPITAL_COMMUNITY): Payer: Medicare Other | Admitting: Certified Registered"

## 2013-01-29 HISTORY — PX: POSTERIOR CERVICAL FUSION/FORAMINOTOMY: SHX5038

## 2013-01-29 LAB — SURGICAL PCR SCREEN
MRSA, PCR: NEGATIVE
Staphylococcus aureus: NEGATIVE

## 2013-01-29 SURGERY — POSTERIOR CERVICAL FUSION/FORAMINOTOMY LEVEL 1
Anesthesia: General | Site: Neck | Wound class: Clean

## 2013-01-29 MED ORDER — HYDROMORPHONE HCL PF 1 MG/ML IJ SOLN
0.2500 mg | INTRAMUSCULAR | Status: DC | PRN
  Administered 2013-01-29 (×4): 0.5 mg via INTRAVENOUS

## 2013-01-29 MED ORDER — OXYCODONE HCL 5 MG PO TABS
5.0000 mg | ORAL_TABLET | Freq: Once | ORAL | Status: AC | PRN
Start: 2013-01-29 — End: 2013-01-29
  Administered 2013-01-29: 5 mg via ORAL

## 2013-01-29 MED ORDER — LACTATED RINGERS IV SOLN
INTRAVENOUS | Status: DC | PRN
  Administered 2013-01-29 (×3): via INTRAVENOUS

## 2013-01-29 MED ORDER — LIDOCAINE HCL (CARDIAC) 20 MG/ML IV SOLN
INTRAVENOUS | Status: DC | PRN
  Administered 2013-01-29: 60 mg via INTRAVENOUS

## 2013-01-29 MED ORDER — CEFAZOLIN SODIUM-DEXTROSE 2-3 GM-% IV SOLR
INTRAVENOUS | Status: AC
  Administered 2013-01-29: 2 g via INTRAVENOUS
  Filled 2013-01-29: qty 50

## 2013-01-29 MED ORDER — THROMBIN 5000 UNITS EX SOLR
OROMUCOSAL | Status: DC | PRN
  Administered 2013-01-29: 22:00:00 via TOPICAL

## 2013-01-29 MED ORDER — PHENYLEPHRINE HCL 10 MG/ML IJ SOLN
20.0000 mg | INTRAVENOUS | Status: DC | PRN
  Administered 2013-01-29: 20 ug/min via INTRAVENOUS

## 2013-01-29 MED ORDER — DEXAMETHASONE SODIUM PHOSPHATE 10 MG/ML IJ SOLN
INTRAMUSCULAR | Status: DC | PRN
  Administered 2013-01-29: 10 mg via INTRAVENOUS

## 2013-01-29 MED ORDER — PROPOFOL 10 MG/ML IV BOLUS
INTRAVENOUS | Status: DC | PRN
  Administered 2013-01-29: 120 mg via INTRAVENOUS

## 2013-01-29 MED ORDER — HYDROMORPHONE HCL PF 1 MG/ML IJ SOLN
0.2500 mg | INTRAMUSCULAR | Status: DC | PRN
  Administered 2013-01-29: 0.5 mg via INTRAVENOUS

## 2013-01-29 MED ORDER — SODIUM CHLORIDE 0.9 % IR SOLN
Status: DC | PRN
  Administered 2013-01-29: 19:00:00

## 2013-01-29 MED ORDER — OXYCODONE HCL 5 MG/5ML PO SOLN: 5.0000 mg | Freq: Once | ORAL | Status: AC | PRN

## 2013-01-29 MED ORDER — OXYCODONE HCL 5 MG PO TABS
ORAL_TABLET | ORAL | Status: AC
  Filled 2013-01-29: qty 1

## 2013-01-29 MED ORDER — HYDROMORPHONE HCL PF 1 MG/ML IJ SOLN
INTRAMUSCULAR | Status: AC
  Filled 2013-01-29: qty 1

## 2013-01-29 MED ORDER — PHENYLEPHRINE HCL 10 MG/ML IJ SOLN
INTRAMUSCULAR | Status: DC | PRN
  Administered 2013-01-29 (×3): 80 ug via INTRAVENOUS
  Administered 2013-01-29: 160 ug via INTRAVENOUS

## 2013-01-29 MED ORDER — OXYCODONE-ACETAMINOPHEN 5-325 MG PO TABS
1.0000 | ORAL_TABLET | ORAL | Status: DC | PRN
  Administered 2013-01-30 – 2013-01-31 (×4): 1 via ORAL
  Administered 2013-01-31: 2 via ORAL
  Administered 2013-01-31: 1 via ORAL
  Filled 2013-01-29 (×4): qty 1
  Filled 2013-01-29: qty 2
  Filled 2013-01-29: qty 1

## 2013-01-29 MED ORDER — CYCLOBENZAPRINE HCL 10 MG PO TABS
5.0000 mg | ORAL_TABLET | Freq: Three times a day (TID) | ORAL | Status: DC | PRN
  Administered 2013-01-30 – 2013-01-31 (×3): 10 mg via ORAL
  Filled 2013-01-29 (×3): qty 1

## 2013-01-29 MED ORDER — ARTIFICIAL TEARS OP OINT
TOPICAL_OINTMENT | OPHTHALMIC | Status: DC | PRN
  Administered 2013-01-29: 1 via OPHTHALMIC

## 2013-01-29 MED ORDER — EPHEDRINE SULFATE 50 MG/ML IJ SOLN
INTRAMUSCULAR | Status: DC | PRN
  Administered 2013-01-29: 20 mg via INTRAVENOUS
  Administered 2013-01-29 (×3): 10 mg via INTRAVENOUS

## 2013-01-29 MED ORDER — BACITRACIN ZINC 500 UNIT/GM EX OINT
TOPICAL_OINTMENT | CUTANEOUS | Status: DC | PRN
  Administered 2013-01-29: 1 via TOPICAL

## 2013-01-29 MED ORDER — SUCCINYLCHOLINE CHLORIDE 20 MG/ML IJ SOLN
INTRAMUSCULAR | Status: DC | PRN
  Administered 2013-01-29: 100 mg via INTRAVENOUS

## 2013-01-29 MED ORDER — 0.9 % SODIUM CHLORIDE (POUR BTL) OPTIME
TOPICAL | Status: DC | PRN
  Administered 2013-01-29: 1000 mL

## 2013-01-29 MED ORDER — ONDANSETRON HCL 4 MG/2ML IJ SOLN
INTRAMUSCULAR | Status: DC | PRN
  Administered 2013-01-29: 4 mg via INTRAVENOUS

## 2013-01-29 MED ORDER — LIDOCAINE HCL 4 % MT SOLN
OROMUCOSAL | Status: DC | PRN
  Administered 2013-01-29: 4 mL via TOPICAL

## 2013-01-29 MED ORDER — ROCURONIUM BROMIDE 100 MG/10ML IV SOLN
INTRAVENOUS | Status: DC | PRN
  Administered 2013-01-29: 50 mg via INTRAVENOUS
  Administered 2013-01-29: 20 mg via INTRAVENOUS

## 2013-01-29 MED ORDER — BUPIVACAINE HCL (PF) 0.5 % IJ SOLN
INTRAMUSCULAR | Status: DC | PRN
  Administered 2013-01-29: 10 mL

## 2013-01-29 MED ORDER — ALBUMIN HUMAN 5 % IV SOLN
INTRAVENOUS | Status: DC | PRN
  Administered 2013-01-29 (×2): via INTRAVENOUS

## 2013-01-29 MED ORDER — FENTANYL CITRATE 0.05 MG/ML IJ SOLN
INTRAMUSCULAR | Status: DC | PRN
  Administered 2013-01-29: 100 ug via INTRAVENOUS
  Administered 2013-01-29: 50 ug via INTRAVENOUS

## 2013-01-29 MED ORDER — MIDAZOLAM HCL 5 MG/5ML IJ SOLN
INTRAMUSCULAR | Status: DC | PRN
  Administered 2013-01-29: 2 mg via INTRAVENOUS

## 2013-01-29 MED ORDER — LIDOCAINE-EPINEPHRINE 1 %-1:100000 IJ SOLN
INTRAMUSCULAR | Status: DC | PRN
  Administered 2013-01-29: 5 mL
  Administered 2013-01-29: 10 mL

## 2013-01-29 MED ORDER — NEOSTIGMINE METHYLSULFATE 1 MG/ML IJ SOLN
INTRAMUSCULAR | Status: DC | PRN
  Administered 2013-01-29: 3 mg via INTRAVENOUS

## 2013-01-29 MED ORDER — MORPHINE SULFATE 4 MG/ML IJ SOLN
4.0000 mg | INTRAMUSCULAR | Status: DC | PRN
  Administered 2013-01-30 (×2): 4 mg via INTRAMUSCULAR
  Filled 2013-01-29 (×2): qty 1

## 2013-01-29 MED ORDER — GLYCOPYRROLATE 0.2 MG/ML IJ SOLN
INTRAMUSCULAR | Status: DC | PRN
  Administered 2013-01-29: 0.4 mg via INTRAVENOUS

## 2013-01-29 MED ORDER — THROMBIN 20000 UNITS EX SOLR
CUTANEOUS | Status: DC | PRN
  Administered 2013-01-29: 19:00:00 via TOPICAL

## 2013-01-29 SURGICAL SUPPLY — 78 items
2.5MM DRILL FOR 3.5MM SCREW ×1 IMPLANT
ADH SKN CLS APL DERMABOND .7 (GAUZE/BANDAGES/DRESSINGS) ×2
BAG DECANTER FOR FLEXI CONT (MISCELLANEOUS) ×2 IMPLANT
BIT DRILL NEURO 2X3.1 SFT TUCH (MISCELLANEOUS) ×1 IMPLANT
BIT DRILL WIRE PASS 1.3MM (BIT) IMPLANT
BLADE SURG 11 STRL SS (BLADE) ×1 IMPLANT
BLADE SURG ROTATE 9660 (MISCELLANEOUS) ×2 IMPLANT
BLOCKER OASYS (Neuro Prosthesis/Implant) ×6 IMPLANT
BRUSH SCRUB EZ PLAIN DRY (MISCELLANEOUS) ×2 IMPLANT
BUR ACRON 5.0MM COATED (BURR) ×1 IMPLANT
CANISTER SUCT 3000ML (MISCELLANEOUS) ×2 IMPLANT
CONNECTOR TRANSVERSE 60MM (Connector) ×2 IMPLANT
CONT SPEC 4OZ CLIKSEAL STRL BL (MISCELLANEOUS) ×2 IMPLANT
COVER TABLE BACK 60X90 (DRAPES) ×2 IMPLANT
DECANTER SPIKE VIAL GLASS SM (MISCELLANEOUS) ×2 IMPLANT
DERMABOND ADVANCED (GAUZE/BANDAGES/DRESSINGS) ×2
DERMABOND ADVANCED .7 DNX12 (GAUZE/BANDAGES/DRESSINGS) ×2 IMPLANT
DRAPE C-ARM 42X72 X-RAY (DRAPES) ×4 IMPLANT
DRAPE C-ARMOR (DRAPES) ×1 IMPLANT
DRAPE LAPAROTOMY 100X72 PEDS (DRAPES) ×2 IMPLANT
DRAPE POUCH INSTRU U-SHP 10X18 (DRAPES) ×2 IMPLANT
DRAPE PROXIMA HALF (DRAPES) IMPLANT
DRILL NEURO 2X3.1 SOFT TOUCH (MISCELLANEOUS) ×2
DRILL OASYS 2.5MM (BIT) ×1 IMPLANT
DRILL WIRE PASS 1.3MM (BIT)
DRIUS OASYS 2.5MM (BIT) ×2
DRSG EMULSION OIL 3X3 NADH (GAUZE/BANDAGES/DRESSINGS) IMPLANT
ELECT REM PT RETURN 9FT ADLT (ELECTROSURGICAL) ×2
ELECTRODE REM PT RTRN 9FT ADLT (ELECTROSURGICAL) ×1 IMPLANT
EVACUATOR 1/8 PVC DRAIN (DRAIN) IMPLANT
GAUZE SPONGE 4X4 16PLY XRAY LF (GAUZE/BANDAGES/DRESSINGS) IMPLANT
GLOVE BIO SURGEON STRL SZ 6.5 (GLOVE) ×4 IMPLANT
GLOVE BIOGEL PI IND STRL 6.5 (GLOVE) IMPLANT
GLOVE BIOGEL PI IND STRL 8 (GLOVE) ×1 IMPLANT
GLOVE BIOGEL PI INDICATOR 6.5 (GLOVE) ×2
GLOVE BIOGEL PI INDICATOR 8 (GLOVE) ×2
GLOVE ECLIPSE 7.5 STRL STRAW (GLOVE) ×4 IMPLANT
GLOVE EXAM NITRILE LRG STRL (GLOVE) IMPLANT
GLOVE EXAM NITRILE MD LF STRL (GLOVE) ×1 IMPLANT
GLOVE EXAM NITRILE XL STR (GLOVE) IMPLANT
GLOVE EXAM NITRILE XS STR PU (GLOVE) IMPLANT
GLOVE OPTIFIT SS 6.5 STRL BRWN (GLOVE) ×1 IMPLANT
GOWN BRE IMP SLV AUR LG STRL (GOWN DISPOSABLE) ×4 IMPLANT
GOWN BRE IMP SLV AUR XL STRL (GOWN DISPOSABLE) ×3 IMPLANT
GOWN STRL REIN 2XL LVL4 (GOWN DISPOSABLE) IMPLANT
HEMOSTAT SURGICEL 2X14 (HEMOSTASIS) IMPLANT
KIT BASIN OR (CUSTOM PROCEDURE TRAY) ×2 IMPLANT
KIT INFUSE X SMALL 1.4CC (Orthopedic Implant) ×1 IMPLANT
KIT ROOM TURNOVER OR (KITS) ×2 IMPLANT
NDL SPNL 18GX3.5 QUINCKE PK (NEEDLE) ×1 IMPLANT
NEEDLE SPNL 18GX3.5 QUINCKE PK (NEEDLE) ×2 IMPLANT
NEEDLE SPNL 22GX3.5 QUINCKE BK (NEEDLE) ×4 IMPLANT
NS IRRIG 1000ML POUR BTL (IV SOLUTION) ×2 IMPLANT
PACK LAMINECTOMY NEURO (CUSTOM PROCEDURE TRAY) ×2 IMPLANT
PAD ARMBOARD 7.5X6 YLW CONV (MISCELLANEOUS) ×6 IMPLANT
PIN MAYFIELD SKULL DISP (PIN) ×2 IMPLANT
ROD OASYS 3.5X25MM (Rod) ×2 IMPLANT
ROD OASYS 3.5X40MM (Rod) ×2 IMPLANT
SCREW BIASED ANGLE 3.5X14 (Screw) ×3 IMPLANT
SPONGE GAUZE 4X4 12PLY (GAUZE/BANDAGES/DRESSINGS) ×1 IMPLANT
SPONGE LAP 4X18 X RAY DECT (DISPOSABLE) IMPLANT
SPONGE SURGIFOAM ABS GEL 100 (HEMOSTASIS) ×2 IMPLANT
STAPLER SKIN PROX WIDE 3.9 (STAPLE) ×2 IMPLANT
STRIP BIOACTIVE VITOSS 25X52X4 (Orthopedic Implant) ×1 IMPLANT
SUT ETHILON 3 0 FSL (SUTURE) IMPLANT
SUT VIC AB 0 CT1 18XCR BRD8 (SUTURE) ×1 IMPLANT
SUT VIC AB 0 CT1 8-18 (SUTURE) ×4
SUT VIC AB 2-0 CP2 18 (SUTURE) ×4 IMPLANT
SUT VIC AB 3-0 SH 8-18 (SUTURE) ×2 IMPLANT
SYR 20ML ECCENTRIC (SYRINGE) ×2 IMPLANT
TAP 3.5MM (TAP) ×1 IMPLANT
TAPE CLOTH SURG 4X10 WHT LF (GAUZE/BANDAGES/DRESSINGS) ×1 IMPLANT
TOWEL OR 17X24 6PK STRL BLUE (TOWEL DISPOSABLE) ×2 IMPLANT
TOWEL OR 17X26 10 PK STRL BLUE (TOWEL DISPOSABLE) ×2 IMPLANT
TRAY FOLEY CATH 14FRSI W/METER (CATHETERS) IMPLANT
TRAY FOLEY CATH 16FRSI W/METER (SET/KITS/TRAYS/PACK) ×1 IMPLANT
UNDERPAD 30X30 INCONTINENT (UNDERPADS AND DIAPERS) ×2 IMPLANT
WATER STERILE IRR 1000ML POUR (IV SOLUTION) ×2 IMPLANT

## 2013-01-29 NOTE — Progress Notes (Signed)
Orthopedic Tech Progress Note Patient Details:  [name] 1934-01-28 [id number]  Ortho Devices Type of Ortho Device: Soft collar Ortho Device/Splint Interventions: Casandra Doffing 01/29/2013, 10:42 PM

## 2013-01-29 NOTE — Anesthesia Preprocedure Evaluation (Addendum)
Anesthesia Evaluation  Patient identified by MRN, date of birth, ID band Patient awake    Reviewed: Allergy & Precautions, H&P , NPO status , Patient's Chart, lab work & pertinent test results, reviewed documented beta blocker date and time   History of Anesthesia Complications Negative for: history of anesthetic complications  Airway Mallampati: II TM Distance: >3 FB Neck ROM: Limited    Dental   Pulmonary former smoker,  breath sounds clear to auscultation        Cardiovascular Exercise Tolerance: Good hypertension, Pt. on home beta blockers and Pt. on medications + Cardiac Stents Rhythm:Regular Rate:Normal  ekg NSR  No recent chest pain    Neuro/Psych    GI/Hepatic GERD-  Medicated and Controlled,  Endo/Other  Hypothyroidism   Renal/GU      Musculoskeletal  (+) Arthritis -, on steriods ,    Abdominal (+) + obese,   Peds  Hematology   Anesthesia Other Findings   Reproductive/Obstetrics                         Anesthesia Physical Anesthesia Plan  ASA: III  Anesthesia Plan: General   Post-op Pain Management:    Induction: Intravenous  Airway Management Planned: Oral ETT and Video Laryngoscope Planned  Additional Equipment:   Intra-op Plan:   Post-operative Plan: Extubation in OR  Informed Consent: I have reviewed the patients History and Physical, chart, labs and discussed the procedure including the risks, benefits and alternatives for the proposed anesthesia with the patient or authorized representative who has indicated his/her understanding and acceptance.     Plan Discussed with: CRNA and Surgeon  Anesthesia Plan Comments:         Anesthesia Quick Evaluation

## 2013-01-29 NOTE — Anesthesia Procedure Notes (Signed)
Procedure Name: Intubation Date/Time: 01/29/2013 6:37 PM Performed by: [name] Pre-anesthesia Checklist: Patient identified, Emergency Drugs available, Suction available and Patient being monitored Patient Re-evaluated:Patient Re-evaluated prior to inductionOxygen Delivery Method: Circle system utilized Preoxygenation: Pre-oxygenation with 100% oxygen Intubation Type: IV induction and Rapid sequence Ventilation: Two handed mask ventilation required, Oral airway inserted - appropriate to patient size and Mask ventilation without difficulty Grade View: Grade I Tube type: Oral Tube size: 7.5 mm Number of attempts: 1 Airway Equipment and Method: Stylet,  Video-laryngoscopy and Oral airway Placement Confirmation: ETT inserted through vocal cords under direct vision,  positive ETCO2 and breath sounds checked- equal and bilateral Secured at: 23 cm Tube secured with: Tape Dental Injury: Teeth and Oropharynx as per pre-operative assessment

## 2013-01-29 NOTE — Op Note (Signed)
01/28/2013 - 01/29/2013  10:34 PM  PATIENT:  [name]  79 y.o. 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  ANESTHESIA:   general  EBL:  Total I/O In: 2100 [I.V.:1600; IV Piggyback:500] Out: 150 [Blood:150]  BLOOD ADMINISTERED:none  COUNT: Correct per nursing staff  DICTATION: Patient was brought to the operating room, placed under general endotracheal anesthesia. The radiolucent 3 pin [place] head holder was applied, and the patient was turned to a prone position. The occipital scalp was shaved with clippers, and the occipital scalp, posterior neck, and upper back were prepped with Betadine soap and solution and draped in a sterile fashion. The midline was infiltrated with local anesthetic with epinephrine. And a midline incision made over the C2-C3 level and carried down through the subcutaneous tissue. The incision was extended as needed rostrally and caudally. Dissection was carried down to the posterior cervical fascia which was incised bilaterally and the paracervical musculature was dissected from the spinous processes and lamina in a subperiosteal fashion. We identified the spinous process lamina of C2, and from there the C3 and C4 levels. We dissected laterally exposing the C2-C3 facet complexes. There is marked spondylitic widening of the left C2-3 facet complex. The C2-3 level had notable laxity due to the spondylitic degeneration. We proceeded with a bilateral C2 and C3 laminectomy, using double-action rongeurs, Kerrison punches, and the  high-speed drill. Fragments of bone were saved, cleaned of soft tissue, and morcellized to be later used as autograft implant. We found the ligament of flavum and interspinous ligament were spondylitic and thickened, and they were removed, and we were able to decompress the thecal sac at the C2-3 level. Once the decompression was completed we proceeded with the arthrodesis. We identified the pedicles of C2. Then with C-arm fluoroscopic guidance we identified posterior entry points, and then drilled into the C2 pedicle and a superior, anterior, and medial trajectory. Each screw hole was examined with the ball probe, good bony surfaces were found, the posterior cortex was tapped, and then we placed 3.5 x 18 mm screws bilaterally.  We then placed a lateral mass screw on the right side at C3 with C-arm fluoroscopic guidance; the screw was drilled to a depth of 14 mm, the posterior cortex tapped, and we placed a 3.5 x 14 mm screw. We placed a 25 mm rod within the screw heads on the right side, and locking caps were placed, and final tightening was performed against a counter torque. On the left side we had more difficulty. We first tried to place a lateral mass screw at C3, but found cystic change within the lateral mass, with the bony tissue having been replaced by degenerative mucoid soft tissue. We then tried to place a lateral mass screw at C4, but again found cystic degenerative mucoid soft tissue within the lateral mass at C4. At neither level could we place a screw within the lateral mass on the left side. We therefore devised an improvised construct. We exposed the previous posterior instrumentation on the left side at the C5 and C6 level. There was sufficient space along the existing 3.5 mm rod to secure are cross-linked  to it. We therefore placed a 40 mm rod within the left C2 screw. It was secured down with a locking cap. We used a short rod to rod connector to secure the new rod to the existing rod (between  the C5 and C6 screws). Final tightening of the rod to the C2 screw was done against a counter torque. The rod to rod connector was firmly tightened down to each of the rods. We also placed a longer rod to rod connector between the 25 mm rod of the right side and the 40 mm rod on the left side. Each of the connections was firmly tightened down. We then packed a combination of locally harvested morcellized autograft, Vitoss BA, and infuse into the C2-3 arthropathic facet joint as well as over the lateral masses of C3, C4, and C5. The wound was irrigated extensively with bacitracin solution numerous times the procedure. Surgifoam was placed within the laminectomy defect, and good hemostasis was established. We then proceeded with closure. Paraspinal muscles were approximate interrupted undyed 1 Vicryl sutures, deep fascia was closed with interrupted undyed 1 Vicryl sutures, Scarpa's fascia was closed with interrupted undyed 1 Vicryl sutures, and the subcutaneous and and subcuticular layers were closed with interrupted inverted 203 0 undyed Vicryl sutures. The skin edges of the upper portion of the wound were approximated with Dermabond, and the skin edges of the lower portion were approximated with surgical staples. The wound was dressed with sterile gauze and Hypafix. Following surgery the patient was turned back to a supine position, the 3 pin Mayfield head holder was removed, the patient was reversed an anesthetic, extubated, and transferred to the recovery room for further care where he was noted to be moving all 4 extremities to command. Postoperative the patient was placed in a [employer] cervical collar.  PLAN OF CARE: Admit to inpatient   PATIENT DISPOSITION:  PACU - hemodynamically stable.   Delay start of Pharmacological VTE agent (>24hrs) due to surgical blood loss or risk of bleeding:  yes

## 2013-01-29 NOTE — Preoperative (Signed)
Beta Blockers   Reason not to administer Beta Blockers:Atenolol given at 0907 hrs on 01/29/2013

## 2013-01-29 NOTE — Transfer of Care (Signed)
Immediate Anesthesia Transfer of Care Note  Patient: [name]  Procedure(s) Performed: Procedure(s) with comments: POSTERIOR CERVICAL FUSION/FORAMINOTOMY LEVEL 1 and C2-5 Posteriolateral Arthrodesis (N/A) - C2-C3 Laminectomy C2-C3 posterior cervical arthrodesis  Patient Location: PACU  Anesthesia Type:General  Level of Consciousness: awake, oriented, patient cooperative and responds to stimulation  Airway & Oxygen Therapy: Patient Spontanous Breathing and Patient connected to nasal cannula oxygen  Post-op Assessment: Report given to PACU RN, Post -op Vital signs reviewed and stable and Patient moving all extremities X 4  Post vital signs: Reviewed and stable  Complications: No apparent anesthesia complications

## 2013-01-29 NOTE — Progress Notes (Signed)
Orthopedic Tech Progress Note Patient Details:  [name] [birth year] [id number] Put on hard collar [place] Patient ID: KINSTON MAGNAN, male   DOB: [birth year], 79 y.o.   MRN: [id number]   Jennye Moccasin 01/29/2013, 10:43 PM

## 2013-01-30 NOTE — Progress Notes (Signed)
Filed Vitals:   01/29/13 2300 01/29/13 2315 01/29/13 2355 01/30/13 0312  BP: 136/61 144/66 135/68 124/76  Pulse: 78 82 88 101  Temp:  97.7 F (36.5 C) 97 F (36.1 C) 98.9 F (37.2 C)  TempSrc:   Axillary Oral  Resp: 22 9 14 16   Height:      Weight:      SpO2: 98% 99% 96% 96%    CBC  Recent Labs  01/28/13 1630  WBC 8.8  HGB 13.2  HCT 39.3  PLT 195   BMET  Recent Labs  01/28/13 1630  NA 140  K 3.9  CL 100  CO2 26  GLUCOSE 122*  BUN 26*  CREATININE 1.20  CALCIUM 9.6   Patient sitting up in chair, has ambulate in the halls with a rolling walker and assistance of the staff and his wife. Dressing clean and dry. He feels strength is improved as compared to 2 days ago. Voiding.  Plan: Encouraged to ambulate at least 4 times a day in the halls. We'll continue to progress through postoperative recovery.  [name], MD 01/30/2013, 8:13 AM

## 2013-01-30 NOTE — Progress Notes (Signed)
Utilization review completed. Tywana Robotham, RN, BSN. 

## 2013-01-30 NOTE — Progress Notes (Signed)
Orthopedic Tech Progress Note Patient Details:  [name] 03/02/1934 [id number]  Ortho Devices Type of Ortho Device: [place] cervical collar Ortho Device/Splint Interventions: Application   [name] 01/30/2013, 9:46 AM

## 2013-01-30 NOTE — Anesthesia Postprocedure Evaluation (Signed)
  Anesthesia Post-op Note  Patient: [name]  Procedure(s) Performed: Procedure(s) with comments: POSTERIOR CERVICAL FUSION/FORAMINOTOMY LEVEL 1 and C2-5 Posteriolateral Arthrodesis (N/A) - C2-C3 Laminectomy C2-C3 posterior cervical arthrodesis  Patient Location: PACU  Anesthesia Type:General  Level of Consciousness: awake and alert   Airway and Oxygen Therapy: Patient Spontanous Breathing  Post-op Pain: moderate  Post-op Assessment: Post-op Vital signs reviewed, Patient's Cardiovascular Status Stable and Respiratory Function Stable  Post-op Vital Signs: Reviewed  Filed Vitals:   01/29/13 2315  BP: 144/66  Pulse: 82  Temp: 36.5 C  Resp: 9    Complications: No apparent anesthesia complications

## 2013-01-31 MED ORDER — HYDROMORPHONE HCL 2 MG PO TABS
2.0000 mg | ORAL_TABLET | ORAL | Status: DC | PRN
Start: 2013-01-31 — End: 2015-02-01

## 2013-01-31 MED ORDER — CYCLOBENZAPRINE HCL 10 MG PO TABS: 10.0000 mg | ORAL_TABLET | Freq: Three times a day (TID) | ORAL | Status: DC | PRN

## 2013-01-31 MED ORDER — OXYCODONE-ACETAMINOPHEN 5-325 MG PO TABS: 1.0000 | ORAL_TABLET | ORAL | Status: DC | PRN

## 2013-01-31 MED ORDER — HYDROMORPHONE HCL 2 MG PO TABS: 2.0000 mg | ORAL_TABLET | ORAL | Status: DC | PRN

## 2013-01-31 MED ORDER — CYCLOBENZAPRINE HCL 10 MG PO TABS: 5.0000 mg | ORAL_TABLET | Freq: Three times a day (TID) | ORAL | Status: DC | PRN

## 2013-01-31 NOTE — Progress Notes (Signed)
Pt. discharged home accompanied by family. Prescriptions and discharge instructions given with verbalization of understanding. Incision site on back with no s/s of infection - no swelling, redness, bleeding, and/or drainage noted. Pain meds given per patient request for pain and discomfort of ride home. Posterior Cervical Fusion surgery notes instructions given to patient and family member for home safety and precautions.

## 2013-01-31 NOTE — Discharge Summary (Signed)
Physician Discharge Summary  Patient ID: [name] MRN: [id number] DOB/AGE: Mar 07, 1934 79 y.o.  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 He was taken to surgery for a C2 and C3 laminectomy and a C2-C5 posterior lateral arthrodesis with posterior instrumentation and bone graft. Postoperatively he has been up and ambulating in the halls. He feels that his strength is improved postoperatively as compared to his condition at the time of admission. His dressing was changed, and his wound is healing well. His wife was instructed in continued dressing changes. He and his wife were instructed in further wound care activities. He is to return for followup with me in 9 days, including staple removal.  Discharge Exam: Blood pressure 137/80, pulse 86, temperature 99.1 F (37.3 C), temperature source Oral, resp. rate 18, height 5\' 7"  (1.702 m), weight 92.534 kg (204 lb), SpO2 93.00%.  Disposition: Home     Medication List         alendronate 70 MG tablet  Commonly known as:  FOSAMAX  Take 70 mg by mouth Once a week. Sundays     amLODipine 5 MG tablet  Commonly known as:  NORVASC  Take 5 mg by mouth Daily.     aspirin EC 81 MG tablet  Take 81 mg by mouth at bedtime.     atenolol 50 MG tablet  Commonly known as:  TENORMIN  Take 50 mg by mouth Daily.     calcium carbonate 600 MG Tabs tablet  Commonly known as:  OS-CAL  Take 600 mg by mouth 2 (two) times daily.     cholecalciferol 1000 UNITS tablet  Commonly known as:  VITAMIN D  Take 1,000 Units by mouth at bedtime.     Co Q 10 100 MG Caps  Take 100 mg by mouth at  bedtime.     cyclobenzaprine 10 MG tablet  Commonly known as:  FLEXERIL  Take 0.5-1 tablets (5-10 mg total) by mouth 3 (three) times daily as needed for muscle spasms.     diclofenac 50 MG EC tablet  Commonly known as:  VOLTAREN  Take 50 mg by mouth Twice daily.     docusate sodium 100 MG capsule  Commonly known as:  COLACE  Take 100 mg by mouth 2 (two) times daily.     hydrochlorothiazide 25 MG tablet  Commonly known as:  HYDRODIURIL  Take 25 mg by mouth Daily.     HYDROcodone-acetaminophen 10-325 MG per tablet  Commonly known as:  NORCO  Take 0.5-1 tablets by mouth 5 (five) times daily as needed. For pain     HYDROmorphone 2 MG tablet  Commonly known as:  DILAUDID  Take 1-2 tablets (2-4 mg total) by mouth every 4 (four) hours as needed for severe pain.     Krill Oil 1000 MG Caps  Take by mouth.     levothyroxine 150 MCG tablet  Commonly known as:  SYNTHROID, LEVOTHROID  Take 150 mcg by mouth daily before breakfast.     losartan 100 MG tablet  Commonly known as:  COZAAR  Take 100 mg by mouth Daily.     multivitamin with minerals Tabs tablet  Take 1 tablet by mouth at bedtime.  nitroGLYCERIN 0.4 MG SL tablet  Commonly known as:  NITROSTAT  Place 0.4 mg under the tongue every 5 (five) minutes as needed for chest pain.     omeprazole 20 MG capsule  Commonly known as:  PRILOSEC  Take 20 mg by mouth Daily.     oxyCODONE-acetaminophen 5-325 MG per tablet  Commonly known as:  PERCOCET/ROXICET  Take 1-2 tablets by mouth every 4 (four) hours as needed for moderate pain.     polyethylene glycol powder powder  Commonly known as:  GLYCOLAX/MIRALAX  Take 17 g by mouth Once daily as needed. For constipation     predniSONE 10 MG tablet  Commonly known as:  DELTASONE  Take 10 mg by mouth Daily. Takes with 2 mg to total 12 mg     predniSONE 1 MG tablet  Commonly known as:  DELTASONE  Take 2 mg by mouth daily. Takes with 10 mg to total 12 mg     senna 8.6 MG tablet   Commonly known as:  SENOKOT  Take 1-2 tablets by mouth 2 (two) times daily. 2 tabs in the morning and 1 tab at bedtime     TUMS PO  Take 3 tablets by mouth at bedtime as needed. For heartburn     ZETIA 10 MG tablet  Generic drug:  ezetimibe  Take 10 mg by mouth at bedtime.         Signed: [name], MD 01/31/2013, 7:57 AM

## 2013-02-03 ENCOUNTER — Other Ambulatory Visit: Payer: Medicare Other

## 2013-02-04 ENCOUNTER — Encounter (HOSPITAL_COMMUNITY): Payer: Self-pay | Admitting: Neurosurgery

## 2013-05-05 ENCOUNTER — Other Ambulatory Visit: Payer: Self-pay | Admitting: Dermatology

## 2013-11-02 ENCOUNTER — Other Ambulatory Visit: Payer: Self-pay | Admitting: Neurosurgery

## 2013-11-02 DIAGNOSIS — M4310 Spondylolisthesis, site unspecified: Secondary | ICD-10-CM

## 2013-11-25 ENCOUNTER — Ambulatory Visit: Payer: BLUE CROSS/BLUE SHIELD | Admitting: Neurology

## 2013-12-30 ENCOUNTER — Ambulatory Visit
Admission: RE | Admit: 2013-12-30 | Discharge: 2013-12-30 | Disposition: A | Discharge status: Home / Self Care | Payer: Medicare Other | Source: Ambulatory Visit | Attending: Neurosurgery | Admitting: Neurosurgery

## 2013-12-30 DIAGNOSIS — M4310 Spondylolisthesis, site unspecified: Secondary | ICD-10-CM

## 2014-05-07 ENCOUNTER — Other Ambulatory Visit: Payer: Self-pay | Admitting: Neurosurgery

## 2014-05-07 DIAGNOSIS — S129XXD Fracture of neck, unspecified, subsequent encounter: Secondary | ICD-10-CM

## 2014-11-02 ENCOUNTER — Ambulatory Visit
Admission: RE | Admit: 2014-11-02 | Discharge: 2014-11-02 | Disposition: A | Discharge status: Home / Self Care | Payer: Medicare Other | Source: Ambulatory Visit | Attending: Neurosurgery | Admitting: Neurosurgery

## 2014-11-02 DIAGNOSIS — S129XXD Fracture of neck, unspecified, subsequent encounter: Secondary | ICD-10-CM

## 2015-01-31 ENCOUNTER — Emergency Department (HOSPITAL_COMMUNITY): Payer: Medicare Other

## 2015-01-31 ENCOUNTER — Encounter (HOSPITAL_COMMUNITY): Payer: Self-pay | Admitting: General Practice

## 2015-01-31 ENCOUNTER — Inpatient Hospital Stay (HOSPITAL_COMMUNITY)
Admission: EM | Admit: 2015-01-31 | Discharge: 2015-02-05 | DRG: 871 | Disposition: A | Discharge status: Skilled Nursing Facility | Payer: Medicare Other | Attending: Internal Medicine | Admitting: Internal Medicine

## 2015-01-31 ENCOUNTER — Inpatient Hospital Stay (HOSPITAL_COMMUNITY): Payer: Medicare Other

## 2015-01-31 DIAGNOSIS — I119 Hypertensive heart disease without heart failure: Secondary | ICD-10-CM | POA: Diagnosis present

## 2015-01-31 DIAGNOSIS — R4182 Altered mental status, unspecified: Secondary | ICD-10-CM | POA: Diagnosis present

## 2015-01-31 DIAGNOSIS — I10 Essential (primary) hypertension: Secondary | ICD-10-CM | POA: Diagnosis present

## 2015-01-31 DIAGNOSIS — R441 Visual hallucinations: Secondary | ICD-10-CM | POA: Diagnosis present

## 2015-01-31 DIAGNOSIS — N320 Bladder-neck obstruction: Secondary | ICD-10-CM | POA: Diagnosis present

## 2015-01-31 DIAGNOSIS — K7689 Other specified diseases of liver: Secondary | ICD-10-CM | POA: Diagnosis not present

## 2015-01-31 DIAGNOSIS — E860 Dehydration: Secondary | ICD-10-CM | POA: Diagnosis present

## 2015-01-31 DIAGNOSIS — M419 Scoliosis, unspecified: Secondary | ICD-10-CM | POA: Diagnosis present

## 2015-01-31 DIAGNOSIS — R945 Abnormal results of liver function studies: Secondary | ICD-10-CM | POA: Insufficient documentation

## 2015-01-31 DIAGNOSIS — F1123 Opioid dependence with withdrawal: Secondary | ICD-10-CM | POA: Diagnosis present

## 2015-01-31 DIAGNOSIS — M79606 Pain in leg, unspecified: Secondary | ICD-10-CM

## 2015-01-31 DIAGNOSIS — M79605 Pain in left leg: Secondary | ICD-10-CM | POA: Diagnosis not present

## 2015-01-31 DIAGNOSIS — T402X5A Adverse effect of other opioids, initial encounter: Secondary | ICD-10-CM | POA: Diagnosis present

## 2015-01-31 DIAGNOSIS — G92 Toxic encephalopathy: Secondary | ICD-10-CM | POA: Diagnosis present

## 2015-01-31 DIAGNOSIS — M5136 Other intervertebral disc degeneration, lumbar region: Secondary | ICD-10-CM | POA: Diagnosis present

## 2015-01-31 DIAGNOSIS — M79604 Pain in right leg: Secondary | ICD-10-CM | POA: Diagnosis not present

## 2015-01-31 DIAGNOSIS — E039 Hypothyroidism, unspecified: Secondary | ICD-10-CM | POA: Diagnosis present

## 2015-01-31 DIAGNOSIS — Z87891 Personal history of nicotine dependence: Secondary | ICD-10-CM

## 2015-01-31 DIAGNOSIS — M549 Dorsalgia, unspecified: Secondary | ICD-10-CM | POA: Diagnosis present

## 2015-01-31 DIAGNOSIS — K219 Gastro-esophageal reflux disease without esophagitis: Secondary | ICD-10-CM | POA: Diagnosis present

## 2015-01-31 DIAGNOSIS — Z7982 Long term (current) use of aspirin: Secondary | ICD-10-CM

## 2015-01-31 DIAGNOSIS — N179 Acute kidney failure, unspecified: Secondary | ICD-10-CM | POA: Diagnosis present

## 2015-01-31 DIAGNOSIS — G8929 Other chronic pain: Secondary | ICD-10-CM | POA: Diagnosis present

## 2015-01-31 DIAGNOSIS — K5900 Constipation, unspecified: Secondary | ICD-10-CM | POA: Diagnosis present

## 2015-01-31 DIAGNOSIS — R339 Retention of urine, unspecified: Secondary | ICD-10-CM | POA: Diagnosis present

## 2015-01-31 DIAGNOSIS — M479 Spondylosis, unspecified: Secondary | ICD-10-CM | POA: Diagnosis present

## 2015-01-31 DIAGNOSIS — N3090 Cystitis, unspecified without hematuria: Secondary | ICD-10-CM | POA: Diagnosis present

## 2015-01-31 DIAGNOSIS — Z981 Arthrodesis status: Secondary | ICD-10-CM | POA: Diagnosis not present

## 2015-01-31 DIAGNOSIS — K5909 Other constipation: Secondary | ICD-10-CM

## 2015-01-31 DIAGNOSIS — G9340 Encephalopathy, unspecified: Secondary | ICD-10-CM

## 2015-01-31 DIAGNOSIS — E876 Hypokalemia: Secondary | ICD-10-CM | POA: Diagnosis present

## 2015-01-31 DIAGNOSIS — B351 Tinea unguium: Secondary | ICD-10-CM | POA: Diagnosis present

## 2015-01-31 DIAGNOSIS — K5732 Diverticulitis of large intestine without perforation or abscess without bleeding: Secondary | ICD-10-CM | POA: Diagnosis present

## 2015-01-31 DIAGNOSIS — A419 Sepsis, unspecified organism: Secondary | ICD-10-CM | POA: Diagnosis present

## 2015-01-31 DIAGNOSIS — K8020 Calculus of gallbladder without cholecystitis without obstruction: Secondary | ICD-10-CM | POA: Diagnosis present

## 2015-01-31 DIAGNOSIS — G40909 Epilepsy, unspecified, not intractable, without status epilepticus: Secondary | ICD-10-CM | POA: Diagnosis present

## 2015-01-31 LAB — COMPREHENSIVE METABOLIC PANEL
ALT: 40 U/L (ref 17–63)
AST: 52 U/L — ABNORMAL HIGH (ref 15–41)
Albumin: 4.4 g/dL (ref 3.5–5.0)
Alkaline Phosphatase: 65 U/L (ref 38–126)
Anion gap: 11 (ref 5–15)
BUN: 21 mg/dL — ABNORMAL HIGH (ref 6–20)
CO2: 28 mmol/L (ref 22–32)
Calcium: 9.7 mg/dL (ref 8.9–10.3)
Chloride: 100 mmol/L — ABNORMAL LOW (ref 101–111)
Creatinine, Ser: 1.28 mg/dL — ABNORMAL HIGH (ref 0.61–1.24)
GFR calc Af Amer: 60 mL/min — ABNORMAL LOW (ref >60)
GFR calc non Af Amer: 52 mL/min — ABNORMAL LOW (ref >60)
Glucose, Bld: 116 mg/dL — ABNORMAL HIGH (ref 65–99)
Potassium: 3.9 mmol/L (ref 3.5–5.1)
Sodium: 139 mmol/L (ref 135–145)
Total Bilirubin: 1.2 mg/dL (ref 0.3–1.2)
Total Protein: 7.3 g/dL (ref 6.5–8.1)

## 2015-01-31 LAB — I-STAT CG4 LACTIC ACID, ED
Lactic Acid, Venous: 2.67 mmol/L (ref 0.5–2.0)
Lactic Acid, Venous: 1.46 mmol/L (ref 0.5–2.0)

## 2015-01-31 LAB — CBC WITH DIFFERENTIAL/PLATELET
Basophils Absolute: 0 10*3/uL (ref 0.0–0.1)
Basophils Relative: 0 %
Eosinophils Absolute: 0.2 10*3/uL (ref 0.0–0.7)
Eosinophils Relative: 2 %
HCT: 44.9 % (ref 39.0–52.0)
Hemoglobin: 14.4 g/dL (ref 13.0–17.0)
Lymphocytes Relative: 13 %
Lymphs Abs: 1.6 10*3/uL (ref 0.7–4.0)
MCH: 31.8 pg (ref 26.0–34.0)
MCHC: 32.1 g/dL (ref 30.0–36.0)
MCV: 99.1 fL (ref 78.0–100.0)
Monocytes Absolute: 1.6 10*3/uL — ABNORMAL HIGH (ref 0.1–1.0)
Monocytes Relative: 13 %
Neutro Abs: 9.1 10*3/uL — ABNORMAL HIGH (ref 1.7–7.7)
Neutrophils Relative %: 72 %
Platelets: 243 10*3/uL (ref 150–400)
RBC: 4.53 MIL/uL (ref 4.22–5.81)
RDW: 14.7 % (ref 11.5–15.5)
WBC: 12.6 10*3/uL — ABNORMAL HIGH (ref 4.0–10.5)

## 2015-01-31 LAB — URINALYSIS, ROUTINE W REFLEX MICROSCOPIC
Bilirubin Urine: NEGATIVE
Glucose, UA: NEGATIVE mg/dL
Hgb urine dipstick: NEGATIVE
Ketones, ur: 15 mg/dL — AB
Leukocytes, UA: NEGATIVE
Nitrite: NEGATIVE
Protein, ur: NEGATIVE mg/dL
Specific Gravity, Urine: 1.018 (ref 1.005–1.030)
pH: 7 (ref 5.0–8.0)

## 2015-01-31 LAB — TSH: TSH: 1.867 u[IU]/mL (ref 0.350–4.500)

## 2015-01-31 LAB — AMMONIA: Ammonia: 19 umol/L (ref 9–35)

## 2015-01-31 MED ORDER — VITAMIN D 1000 UNITS PO TABS
1000.0000 [IU] | ORAL_TABLET | Freq: Every day | ORAL | Status: DC
  Administered 2015-01-31 – 2015-02-04 (×5): 1000 [IU] via ORAL
  Filled 2015-01-31 (×5): qty 1

## 2015-01-31 MED ORDER — SENNA 8.6 MG PO TABS
1.0000 | ORAL_TABLET | Freq: Every day | ORAL | Status: DC
  Administered 2015-02-01 – 2015-02-02 (×2): 8.6 mg via ORAL
  Filled 2015-01-31 (×3): qty 1

## 2015-01-31 MED ORDER — SODIUM CHLORIDE 0.9 % IV SOLN
1750.0000 mg | Freq: Once | INTRAVENOUS | Status: AC
  Administered 2015-01-31: 1750 mg via INTRAVENOUS
  Filled 2015-01-31: qty 1750

## 2015-01-31 MED ORDER — PIPERACILLIN-TAZOBACTAM 3.375 G IVPB 30 MIN
3.3750 g | Freq: Once | INTRAVENOUS | Status: AC
  Administered 2015-01-31: 3.375 g via INTRAVENOUS
  Filled 2015-01-31: qty 50

## 2015-01-31 MED ORDER — LEVOTHYROXINE SODIUM 150 MCG PO TABS
150.0000 ug | ORAL_TABLET | Freq: Every day | ORAL | Status: DC
  Administered 2015-02-01 – 2015-02-05 (×5): 150 ug via ORAL
  Filled 2015-01-31 (×5): qty 1

## 2015-01-31 MED ORDER — VANCOMYCIN HCL IN DEXTROSE 1-5 GM/200ML-% IV SOLN: 1000.0000 mg | Freq: Once | INTRAVENOUS | Status: DC

## 2015-01-31 MED ORDER — PREDNISONE 10 MG PO TABS
10.0000 mg | ORAL_TABLET | Freq: Every day | ORAL | Status: DC
  Administered 2015-02-01 – 2015-02-05 (×5): 10 mg via ORAL
  Filled 2015-01-31 (×5): qty 1

## 2015-01-31 MED ORDER — PIPERACILLIN-TAZOBACTAM 3.375 G IVPB
3.3750 g | Freq: Three times a day (TID) | INTRAVENOUS | Status: DC
  Administered 2015-02-01 – 2015-02-02 (×5): 3.375 g via INTRAVENOUS
  Filled 2015-01-31 (×7): qty 50

## 2015-01-31 MED ORDER — AMLODIPINE BESYLATE 5 MG PO TABS
5.0000 mg | ORAL_TABLET | Freq: Every day | ORAL | Status: DC
  Administered 2015-01-31 – 2015-02-05 (×6): 5 mg via ORAL
  Filled 2015-01-31 (×6): qty 1

## 2015-01-31 MED ORDER — ACETAMINOPHEN 650 MG RE SUPP: 650.0000 mg | Freq: Four times a day (QID) | RECTAL | Status: DC | PRN

## 2015-01-31 MED ORDER — DOCUSATE SODIUM 100 MG PO CAPS
200.0000 mg | ORAL_CAPSULE | Freq: Two times a day (BID) | ORAL | Status: DC
  Administered 2015-01-31 – 2015-02-02 (×5): 200 mg via ORAL
  Filled 2015-01-31 (×6): qty 2

## 2015-01-31 MED ORDER — ADULT MULTIVITAMIN W/MINERALS CH
1.0000 | ORAL_TABLET | Freq: Every day | ORAL | Status: DC
  Administered 2015-01-31 – 2015-02-04 (×5): 1 via ORAL
  Filled 2015-01-31 (×5): qty 1

## 2015-01-31 MED ORDER — ACETAMINOPHEN 325 MG PO TABS: 650.0000 mg | ORAL_TABLET | Freq: Four times a day (QID) | ORAL | Status: DC | PRN

## 2015-01-31 MED ORDER — LIDOCAINE HCL 2 % EX GEL
1.0000 "application " | Freq: Once | CUTANEOUS | Status: AC
  Administered 2015-01-31: 1 via TOPICAL
  Filled 2015-01-31: qty 20

## 2015-01-31 MED ORDER — PRAVASTATIN SODIUM 40 MG PO TABS
40.0000 mg | ORAL_TABLET | Freq: Every day | ORAL | Status: DC
  Administered 2015-01-31 – 2015-02-05 (×6): 40 mg via ORAL
  Filled 2015-01-31 (×6): qty 1

## 2015-01-31 MED ORDER — HYDRALAZINE HCL 20 MG/ML IJ SOLN
10.0000 mg | Freq: Four times a day (QID) | INTRAMUSCULAR | Status: DC | PRN
  Filled 2015-01-31: qty 1

## 2015-01-31 MED ORDER — VANCOMYCIN HCL IN DEXTROSE 750-5 MG/150ML-% IV SOLN
750.0000 mg | Freq: Two times a day (BID) | INTRAVENOUS | Status: DC
Start: 2015-02-01 — End: 2015-02-02
  Administered 2015-02-01 – 2015-02-02 (×3): 750 mg via INTRAVENOUS
  Filled 2015-01-31 (×6): qty 150

## 2015-01-31 MED ORDER — IOHEXOL 300 MG/ML  SOLN
100.0000 mL | Freq: Once | INTRAMUSCULAR | Status: AC | PRN
  Administered 2015-01-31: 100 mL via INTRAVENOUS

## 2015-01-31 MED ORDER — HEPARIN SODIUM (PORCINE) 5000 UNIT/ML IJ SOLN
5000.0000 [IU] | Freq: Three times a day (TID) | INTRAMUSCULAR | Status: DC
  Administered 2015-02-01 – 2015-02-05 (×12): 5000 [IU] via SUBCUTANEOUS
  Filled 2015-01-31 (×13): qty 1

## 2015-01-31 MED ORDER — EZETIMIBE 10 MG PO TABS
5.0000 mg | ORAL_TABLET | Freq: Every day | ORAL | Status: DC
  Administered 2015-01-31 – 2015-02-04 (×5): 5 mg via ORAL
  Filled 2015-01-31 (×5): qty 1

## 2015-01-31 MED ORDER — CALCIUM CARBONATE 1250 (500 CA) MG PO TABS
1250.0000 mg | ORAL_TABLET | Freq: Two times a day (BID) | ORAL | Status: DC
  Administered 2015-01-31 – 2015-02-03 (×7): 1250 mg via ORAL
  Filled 2015-01-31 (×7): qty 3
  Filled 2015-01-31: qty 1
  Filled 2015-01-31 (×2): qty 3

## 2015-01-31 MED ORDER — ONDANSETRON HCL 4 MG/2ML IJ SOLN: 4.0000 mg | Freq: Four times a day (QID) | INTRAMUSCULAR | Status: DC | PRN

## 2015-01-31 MED ORDER — TROLAMINE SALICYLATE 10 % EX CREA
1.0000 "application " | TOPICAL_CREAM | CUTANEOUS | Status: DC | PRN
  Filled 2015-01-31: qty 85

## 2015-01-31 MED ORDER — SENNA 8.6 MG PO TABS
2.0000 | ORAL_TABLET | Freq: Every morning | ORAL | Status: DC
  Administered 2015-02-01 – 2015-02-02 (×2): 17.2 mg via ORAL
  Filled 2015-01-31 (×2): qty 2

## 2015-01-31 MED ORDER — SODIUM CHLORIDE 0.9 % IV BOLUS (SEPSIS)
1000.0000 mL | Freq: Once | INTRAVENOUS | Status: AC
  Administered 2015-01-31: 1000 mL via INTRAVENOUS

## 2015-01-31 MED ORDER — SODIUM CHLORIDE 0.9 % IV SOLN
INTRAVENOUS | Status: DC
  Administered 2015-02-01 (×2): via INTRAVENOUS

## 2015-01-31 MED ORDER — ATENOLOL 50 MG PO TABS
25.0000 mg | ORAL_TABLET | Freq: Every day | ORAL | Status: DC
  Administered 2015-01-31 – 2015-02-05 (×6): 25 mg via ORAL
  Filled 2015-01-31 (×6): qty 1

## 2015-01-31 MED ORDER — POLYETHYLENE GLYCOL 3350 17 GM/SCOOP PO POWD
17.0000 g | Freq: Every day | ORAL | Status: DC
  Filled 2015-01-31: qty 255

## 2015-01-31 MED ORDER — OMEGA-3-ACID ETHYL ESTERS 1 G PO CAPS
1.0000 g | ORAL_CAPSULE | Freq: Every day | ORAL | Status: DC
  Administered 2015-02-02 – 2015-02-04 (×2): 1 g via ORAL
  Filled 2015-01-31 (×5): qty 1

## 2015-01-31 MED ORDER — CO Q 10 100 MG PO CAPS: 100.0000 mg | ORAL_CAPSULE | Freq: Every day | ORAL | Status: DC

## 2015-01-31 MED ORDER — NITROGLYCERIN 0.4 MG SL SUBL: 0.4000 mg | SUBLINGUAL_TABLET | SUBLINGUAL | Status: DC | PRN

## 2015-01-31 MED ORDER — MUSCLE RUB 10-15 % EX CREA
TOPICAL_CREAM | CUTANEOUS | Status: DC | PRN
  Administered 2015-02-05: 11:00:00 via TOPICAL
  Filled 2015-01-31 (×2): qty 85

## 2015-01-31 MED ORDER — SODIUM CHLORIDE 0.9 % IJ SOLN
3.0000 mL | Freq: Two times a day (BID) | INTRAMUSCULAR | Status: DC
  Administered 2015-01-31 – 2015-02-04 (×6): 3 mL via INTRAVENOUS

## 2015-01-31 MED ORDER — PANTOPRAZOLE SODIUM 40 MG PO TBEC
40.0000 mg | DELAYED_RELEASE_TABLET | Freq: Every day | ORAL | Status: DC
  Administered 2015-01-31 – 2015-02-04 (×5): 40 mg via ORAL
  Filled 2015-01-31 (×6): qty 1

## 2015-01-31 MED ORDER — ONDANSETRON HCL 4 MG PO TABS: 4.0000 mg | ORAL_TABLET | Freq: Four times a day (QID) | ORAL | Status: DC | PRN

## 2015-01-31 MED ORDER — SENNOSIDES 8.6 MG PO TABS: 1.0000 | ORAL_TABLET | Freq: Two times a day (BID) | ORAL | Status: DC

## 2015-01-31 MED ORDER — OXYMETAZOLINE HCL 0.05 % NA SOLN: 1.0000 | Freq: Two times a day (BID) | NASAL | Status: DC

## 2015-01-31 NOTE — ED Notes (Addendum)
Pt presents from home with wife via [facility]. Pt presents with confusion for the past 2 days. Pt is A/O x's 4, but has a delayed response to questions and does not know the year. Pt is neurologically intact. Per EMS wife reports patient not taking medication for the past two days as well. Pt denies any SOB, CP, or dizziness. EMS EKG was unremarkable. Pt has a history of nerve damage on his right side.

## 2015-01-31 NOTE — ED Provider Notes (Signed)
CSN: [id number]     Arrival date & time 01/31/15  1053 History   First MD Initiated Contact with Patient 01/31/15 1054     Chief Complaint  Patient presents with  . Altered Mental Status     (Consider location/radiation/quality/duration/timing/severity/associated sxs/prior Treatment) HPI Patient presents to the emergency department with altered mental status that started 2 days ago.  The wife states that the patient has been confused and not taking his medicines eating or drinking very well.  The patient cannot tell me the day of the week, the year or the president.  Patient states that he feels like he could tell me the month, but it would take him a while to figure it out.  The patient denies any symptoms to me and does seem confused and answering questions.  The wife states the patient was confused about which pill, states that he has not taken his pills and several days Past Medical History  Diagnosis Date  . Hypothyroidism   . Vertigo     hx of  . Constipation     from medications taken  . GERD (gastroesophageal reflux disease)   . Arthritis   . Cancer ([place])     skin CA removed from back  . Hypercholesteremia   . Hypertension   . Bruises easily   . Complication of anesthesia     trouble waking up   Past Surgical History  Procedure Laterality Date  . Tonsillectomy    . Hernia repair    . Knee arthroplasty      right knee X 2; left knee once  . Laminectomy      X 6  . Back surgery    . Eye surgery      Bilateral Cataract surgery   . Cardiac catheterization      Stent X 1  . Coronary angioplasty    . Cervix surgery    . Posterior cervical fusion/foraminotomy  01/28/2012    Procedure: POSTERIOR CERVICAL FUSION/FORAMINOTOMY LEVEL 3;  Surgeon: [name], MD;  Location: [facility];  Service: Neurosurgery;  Laterality: Left;  Posterior Cervical Five-Thoracic One Fusion, Arthrodesis with LEFT Cervical Seven-thoracic One Laminectomy, Foraminotomy and Resection of  Synovial Cyst  . Posterior cervical fusion/foraminotomy N/A 01/29/2013    Procedure: POSTERIOR CERVICAL FUSION/FORAMINOTOMY LEVEL 1 and C2-5 Posteriolateral Arthrodesis;  Surgeon: [name], MD;  Location: [facility];  Service: Neurosurgery;  Laterality: N/A;  C2-C3 Laminectomy C2-C3 posterior cervical arthrodesis   No family history on file. Social History  Substance Use Topics  . Smoking status: Former Research scientist (life sciences)  . Smokeless tobacco: None  . Alcohol Use: No     Comment: rare    Review of Systems  Level V caveat applies due to altered mental status  Allergies  Demerol  Home Medications   Prior to Admission medications   Medication Sig Start Date End Date Taking? Authorizing Provider  acetaminophen (TYLENOL) 500 MG tablet Take 500 mg by mouth every 6 (six) hours as needed for mild pain.   Yes Historical Provider, MD  alendronate (FOSAMAX) 70 MG tablet Take 70 mg by mouth Once a week. Sundays 01/06/12  Yes Historical Provider, MD  amLODipine (NORVASC) 5 MG tablet Take 5 mg by mouth Daily.  12/23/11  Yes Historical Provider, MD  aspirin EC 81 MG tablet Take 81 mg by mouth at bedtime.   Yes Historical Provider, MD  atenolol (TENORMIN) 50 MG tablet Take 25 mg by mouth Daily.  12/23/11  Yes  Historical Provider, MD  calcium carbonate (OS-CAL) 600 MG TABS Take 600 mg by mouth 2 (two) times daily.   Yes Historical Provider, MD  cholecalciferol (VITAMIN D) 1000 UNITS tablet Take 1,000 Units by mouth at bedtime.   Yes Historical Provider, MD  Coenzyme Q10 (CO Q 10) 100 MG CAPS Take 100 mg by mouth at bedtime.   Yes Historical Provider, MD  diclofenac (VOLTAREN) 50 MG EC tablet Take 50 mg by mouth Twice daily. 11/09/11  Yes Historical Provider, MD  docusate sodium (COLACE) 100 MG capsule Take 200 mg by mouth 2 (two) times daily.    Yes Historical Provider, MD  gabapentin (NEURONTIN) 300 MG capsule Take 600 mg by mouth every 12 (twelve) hours.   Yes Historical Provider, MD   hydrochlorothiazide (HYDRODIURIL) 25 MG tablet Take 25 mg by mouth Daily. 12/23/11  Yes Historical Provider, MD  HYDROcodone-acetaminophen (NORCO) 10-325 MG per tablet Take 1 tablet by mouth every 4 (four) hours as needed for moderate pain.    Yes Historical Provider, MD  levothyroxine (SYNTHROID, LEVOTHROID) 150 MCG tablet Take 150 mcg by mouth daily before breakfast. 12/27/11  Yes Historical Provider, MD  losartan (COZAAR) 100 MG tablet Take 100 mg by mouth Daily. 12/23/11  Yes Historical Provider, MD  Multiple Vitamin (MULTIVITAMIN WITH MINERALS) TABS Take 1 tablet by mouth at bedtime.   Yes Historical Provider, MD  Omega-3 Fatty Acids (FISH OIL) 1200 MG CAPS Take 1,200 mg by mouth 2 (two) times daily.   Yes Historical Provider, MD  omeprazole (PRILOSEC) 20 MG capsule Take 20 mg by mouth Daily. 12/23/11  Yes Historical Provider, MD  oxymetazoline (AFRIN) 0.05 % nasal spray Place 1 spray into both nostrils 2 (two) times daily.   Yes Historical Provider, MD  pravastatin (PRAVACHOL) 40 MG tablet Take 40 mg by mouth daily. 10/29/14  Yes Historical Provider, MD  predniSONE (DELTASONE) 10 MG tablet Take 10 mg by mouth Daily.  01/06/12  Yes Historical Provider, MD  senna (SENOKOT) 8.6 MG tablet Take 1-2 tablets by mouth 2 (two) times daily. 2 tabs in the morning and 1 tab at bedtime   Yes Historical Provider, MD  trolamine salicylate (ASPERCREME) 10 % cream Apply 1 application topically as needed for muscle pain.   Yes Historical Provider, MD  ZETIA 10 MG tablet Take 5 mg by mouth at bedtime.  10/15/11  Yes Historical Provider, MD  cyclobenzaprine (FLEXERIL) 10 MG tablet Take 0.5-1 tablets (5-10 mg total) by mouth 3 (three) times daily as needed for muscle spasms. Patient not taking: Reported on 01/31/2015 01/31/13   Jovita Gamma, MD  HYDROmorphone (DILAUDID) 2 MG tablet Take 1-2 tablets (2-4 mg total) by mouth every 4 (four) hours as needed for severe pain. Patient not taking: Reported on 01/31/2015  01/31/13   Jovita Gamma, MD  nitroGLYCERIN (NITROSTAT) 0.4 MG SL tablet Place 0.4 mg under the tongue every 5 (five) minutes as needed for chest pain.    Historical Provider, MD  oxyCODONE-acetaminophen (PERCOCET/ROXICET) 5-325 MG per tablet Take 1-2 tablets by mouth every 4 (four) hours as needed for moderate pain. Patient not taking: Reported on 01/31/2015 01/31/13   Jovita Gamma, MD  polyethylene glycol powder (GLYCOLAX/MIRALAX) powder Take 17 g by mouth Once daily as needed. For constipation 11/02/11   Historical Provider, MD   BP 117/72 mmHg  Pulse 99  Temp(Src) 99 F (37.2 C) (Rectal)  Resp 23  Ht 5' 11.5" (1.816 m)  Wt 92.534 kg  BMI 28.06 kg/m2  SpO2  96% Physical Exam  Constitutional: He appears well-developed and well-nourished. No distress.  HENT:  Head: Normocephalic and atraumatic.  Mouth/Throat: Oropharynx is clear and moist.  Eyes: Pupils are equal, round, and reactive to light.  Neck: Normal range of motion. Neck supple.  Cardiovascular: Normal rate, regular rhythm and normal heart sounds.  Exam reveals no gallop and no friction rub.   No murmur heard. Pulmonary/Chest: Effort normal and breath sounds normal. No respiratory distress. He has no wheezes.  Abdominal: Soft. Bowel sounds are normal. He exhibits no distension. There is tenderness. There is no rebound and no guarding.  Neurological: He is alert. He exhibits normal muscle tone. Coordination normal. GCS eye subscore is 4. GCS verbal subscore is 5. GCS motor subscore is 6.  Patient is alert to self and place only  Skin: Skin is warm and dry. No rash noted. No erythema.  Psychiatric: He has a normal mood and affect. His behavior is normal.  Nursing note and vitals reviewed.   ED Course  Procedures (including critical care time) Labs Review Labs Reviewed  COMPREHENSIVE METABOLIC PANEL - Abnormal; Notable for the following:    Chloride 100 (*)    Glucose, Bld 116 (*)    BUN 21 (*)    Creatinine, Ser  1.28 (*)    AST 52 (*)    GFR calc non Af Amer 52 (*)    GFR calc Af Amer 60 (*)    All other components within normal limits  CBC WITH DIFFERENTIAL/PLATELET - Abnormal; Notable for the following:    WBC 12.6 (*)    Neutro Abs 9.1 (*)    Monocytes Absolute 1.6 (*)    All other components within normal limits  I-STAT CG4 LACTIC ACID, ED - Abnormal; Notable for the following:    Lactic Acid, Venous 2.67 (*)    All other components within normal limits  CULTURE, BLOOD (ROUTINE X 2)  CULTURE, BLOOD (ROUTINE X 2)  URINE CULTURE  URINALYSIS, ROUTINE W REFLEX MICROSCOPIC (NOT AT [facility])  I-STAT CG4 LACTIC ACID, ED    Imaging Review Dg Chest 2 View  01/31/2015  CLINICAL DATA:  Chest pain, weakness for a few days. History of hypertension. EXAM: CHEST  2 VIEW COMPARISON:  01/18/2012 FINDINGS: Linear densities in the lung bases likely reflect scarring or atelectasis. Heart is normal size. Mild tortuosity of the thoracic aorta. No effusions. No acute bony abnormality. IMPRESSION: Bibasilar linear scarring or atelectasis. Electronically Signed   By: Rolm Baptise M.D.   On: 01/31/2015 13:33   Ct Head Wo Contrast  01/31/2015  CLINICAL DATA:  Confusion today.  Initial encounter. EXAM: CT HEAD WITHOUT CONTRAST TECHNIQUE: Contiguous axial images were obtained from the base of the skull through the vertex without intravenous contrast. COMPARISON:  None. FINDINGS: There is cortical atrophy noted and chronic microvascular ischemic change. No evidence of acute intracranial abnormality including hemorrhage, infarct, mass lesion, mass effect, midline shift or abnormal extra-axial fluid collection. No hydrocephalus or pneumocephalus. The calvarium is intact. IMPRESSION: No acute abnormality. Atrophy and chronic microvascular ischemic change. Electronically Signed   By: Inge Rise M.D.   On: 01/31/2015 14:01   I have personally reviewed and evaluated these images and lab results as part of my medical  decision-making.   EKG Interpretation   Date/Time:  Monday January 31 2015 10:59:57 EST Ventricular Rate:  111 PR Interval:  159 QRS Duration: 95 QT Interval:  363 QTC Calculation: 493 R Axis:   -2 Text Interpretation:  Sinus tachycardia Atrial premature complex  Borderline prolonged QT interval background noise No significant change  since last tracing Confirmed by FLOYD MD, Quillian Quince [phone]) on 01/31/2015  1:45:45 PM      The patient will most likely need to be admitted.  I did talk with the hospitalist, briefly about the patient and they would like to wait for the CT scan returned, I did advise them this could represent a stroke and that MRI may be needed.  They stated they would assess that.  I did start the patient on the sepsis protocol based on the fact that he had a low-grade rectal temperature, white count had a lactic acid that was elevated.    Dalia Heading, PA-C 01/31/15 [place], DO 01/31/15 2002

## 2015-01-31 NOTE — Progress Notes (Signed)
RN, [place], paged this NP because pt's wife concerned over urine appearing more "amber/red". NP reviewed chart. Pt here with cystitis/wall thickening and urinary retention as well as other issues. Had Foley placed. If this is truly hematuria, it may be secondary to Foley placement. Also, pt had UA on admit and urine culture is pending. Pt is already on Vanc/Zosyn for PNA. Hgb is stable. CT abd did state if pt should develop hematuria, further testing of renal MRI may be warranted. At this time, this NP sees no need to r/p plain UA. Will follow H/H in am and monitor pt tonight. Left report for attending in am about this issue.  Clance Boll, NP [facility]

## 2015-01-31 NOTE — Progress Notes (Addendum)
Pt. Arrived to the unit accompanied with wife. Pt. Is alert and oriented x3 with no signs of distress noted. Pt. Vitals appear stable. Pt. Ambulated from bed to the bathroom with a walker x1 assist and tolerated well. Educated pt. Wife on use of staff numbers, room telephone and call bell. Call light within reach. Orders released. No further needs noted at this time. Night shift RN to follow up with assessment and care.

## 2015-01-31 NOTE — Progress Notes (Signed)
ANTIBIOTIC CONSULT NOTE - INITIAL  Pharmacy Consult for vancomycin, azosyn Indication: rule out sepsis  Allergies  Allergen Reactions  . Demerol [Meperidine] Nausea And Vomiting    Patient Measurements: Height: 5' 11.5" (181.6 cm) Weight: 204 lb (92.534 kg) IBW/kg (Calculated) : 76.45 Adjusted Body Weight:   Vital Signs: Temp: 99 F (37.2 C) (11/21 1159) Temp Source: Rectal (11/21 1156) BP: 154/106 mmHg (11/21 1057) Pulse Rate: 109 (11/21 1057) Intake/Output from previous day:   Intake/Output from this shift:    Labs:  Recent Labs  01/31/15 1114  WBC 12.6*  HGB 14.4  PLT 243  CREATININE 1.28*   Estimated Creatinine Clearance: 54.9 mL/min (by C-G formula based on Cr of 1.28). No results for input(s): VANCOTROUGH, VANCOPEAK, VANCORANDOM, GENTTROUGH, GENTPEAK, GENTRANDOM, TOBRATROUGH, TOBRAPEAK, TOBRARND, AMIKACINPEAK, AMIKACINTROU, AMIKACIN in the last 72 hours.   Microbiology: Recent Results (from the past 720 hour(s))  Culture, blood (routine x 2)     Status: None (Preliminary result)   Collection Time: 01/31/15 11:52 AM  Result Value Ref Range Status   Specimen Description BLOOD RIGHT HAND  Final   Special Requests BOTTLES DRAWN AEROBIC AND ANAEROBIC 5CC  Final   Culture PENDING  Incomplete   Report Status PENDING  Incomplete  Culture, blood (routine x 2)     Status: None (Preliminary result)   Collection Time: 01/31/15 11:53 AM  Result Value Ref Range Status   Specimen Description BLOOD RIGHT ANTECUBITAL  Final   Special Requests BOTTLES DRAWN AEROBIC AND ANAEROBIC 5ML  Final   Culture PENDING  Incomplete   Report Status PENDING  Incomplete    Medical History: Past Medical History  Diagnosis Date  . Hypothyroidism   . Vertigo     hx of  . Constipation     from medications taken  . GERD (gastroesophageal reflux disease)   . Arthritis   . Cancer ([place])     skin CA removed from back  . Hypercholesteremia   . Hypertension   . Bruises easily   .  Complication of anesthesia     trouble waking up    Medications:   (Not in a hospital admission)  Assessment: Admitted with altered mental status, LA 2.67, WBC 12.6, initiating abx for sepsis with unclear source of infection at this time. Scr 1.28, eCrCl 50-55 ml/min.  Goal of Therapy:  Vancomycin trough level 15-20 mcg/ml  Plan:  -Vancomycin 1750 mg IV x1 then 750 mg IV q12h -Zosyn 3.375 g IV q8h -Monitor renal fx, cultures, VT as needed   Hughes Better, PharmD, BCPS Clinical Pharmacist Pager: [phone] 01/31/2015 1:44 PM

## 2015-01-31 NOTE — Progress Notes (Signed)
Pt urine color red amber color, pt Wife patricia concerned stating " He peeped a whole bag full and it was not that color", requesting repeat urinalysis, Page oncall NP. Awaiting callback or orders to be placed. Will continue to monitor.

## 2015-01-31 NOTE — ED Notes (Signed)
Bladder scanner measured 111 mL inside the bladder.

## 2015-01-31 NOTE — H&P (Signed)
[facility] History and Physical  CRISTIN SZATKOWSKI NTI:[id number] DOB: [birth year] DOA: 01/31/2015  Referring physician: Dr Gerald Stabs lawyer PCP: Donnajean Lopes, MD   Chief Complaint:  Altered Mental status since 2-3 days  History provided by patient and his wife [name] at bedside.  HPI:  81 year old male with history of hypertension, hypothyroidism, constipation, GERD, arthritis of the knee, hyperlipidemia, multiple back surgeries who was brought to the ED by his wife of the patient has been increasingly confused for the past 2-3 days. 3 days back patient complained of being full and having abdominal bloating. He had poor appetite during the evening. The next day he was mostly  in bed and had very poor appetite and also was not taking his medications (which he does by himself). Yesterday he was found to be increasingly confused and sleepy. He was also not ambulating much. Wife denies any fever, headache, dizziness, complains of nausea, vomiting, chest pain, palpitations, shortness of breath, polyuria or dysuria. Denies any change in gait, recent use of antibiotic, recent illness, see contact or recent travel. She informs that patient is on multiple medications for constipation but has not had a bowel movement in possibly 3-4 days. Denies any slurred speech or focal weakness. She reports that patient has been having episode of confusion for the past 2 years with some progression.  Course in the ED patient met criteria for sepsis with fever 100.2 Fahrenheit, tachycardic with heart rate of 110, sensitivity 27, elevated blood pressure, WBC of 12.6, acute kidney injury and lactate of 2.67. Cisplatin was initiated and patient given IV normal saline bolus. Foley catheter was placed in due to urinary retention.  UA was unremarkable. Blood cultures sent and patient given empiric vancomycin and Zosyn. Chest x-ray was negative for infiltrate. Given his symptoms of abdominal pain a CT of his abdomen and  pelvis was done which showed mild sigmoid diverticulitis. Also some prominence to rule out: Previously no lesions of different complexity (benign and complex cysts). showed lumbar scoliosis, spondylosis and degenerative disc disease, aortoiliac atherosclerotic vascular disease. Also showed urinary bladder wall thickening possible cystitis. Head CT was done for his confusion which was negative for acute findings (showed atrophy and chronic microvascular ischemic changes).  Hospitalist admission requested to telemetry.   Review of Systems:  Limited due to patient's encephalopathy. 12 point review of systems otherwise unremarkable.   Past Medical History  Diagnosis Date  . Hypothyroidism   . Vertigo     hx of  . Constipation     from medications taken  . GERD (gastroesophageal reflux disease)   . Arthritis   . Cancer ([place])     skin CA removed from back  . Hypercholesteremia   . Hypertension   . Bruises easily   . Complication of anesthesia     trouble waking up   Past Surgical History  Procedure Laterality Date  . Tonsillectomy    . Hernia repair    . Knee arthroplasty      right knee X 2; left knee once  . Laminectomy      X 6  . Back surgery    . Eye surgery      Bilateral Cataract surgery   . Cardiac catheterization      Stent X 1  . Coronary angioplasty    . Cervix surgery    . Posterior cervical fusion/foraminotomy  01/28/2012    Procedure: POSTERIOR CERVICAL FUSION/FORAMINOTOMY LEVEL 3;  Surgeon: [name], MD;  Location: [facility];  Service: Neurosurgery;  Laterality: Left;  Posterior Cervical Five-Thoracic One Fusion, Arthrodesis with LEFT Cervical Seven-thoracic One Laminectomy, Foraminotomy and Resection of Synovial Cyst  . Posterior cervical fusion/foraminotomy N/A 01/29/2013    Procedure: POSTERIOR CERVICAL FUSION/FORAMINOTOMY LEVEL 1 and C2-5 Posteriolateral Arthrodesis;  Surgeon: [name], MD;  Location: [facility];  Service: Neurosurgery;   Laterality: N/A;  C2-C3 Laminectomy C2-C3 posterior cervical arthrodesis   Social History:  reports that he has quit smoking. He does not have any smokeless tobacco history on file. He reports that he does not drink alcohol or use illicit drugs.  Allergies  Allergen Reactions  . Demerol [Meperidine] Nausea And Vomiting    No family history on file.  Prior to Admission medications   Medication Sig Start Date End Date Taking? Authorizing Provider  acetaminophen (TYLENOL) 500 MG tablet Take 500 mg by mouth every 6 (six) hours as needed for mild pain.   Yes Historical Provider, MD  alendronate (FOSAMAX) 70 MG tablet Take 70 mg by mouth Once a week. Sundays 01/06/12  Yes Historical Provider, MD  amLODipine (NORVASC) 5 MG tablet Take 5 mg by mouth Daily.  12/23/11  Yes Historical Provider, MD  aspirin EC 81 MG tablet Take 81 mg by mouth at bedtime.   Yes Historical Provider, MD  atenolol (TENORMIN) 50 MG tablet Take 25 mg by mouth Daily.  12/23/11  Yes Historical Provider, MD  calcium carbonate (OS-CAL) 600 MG TABS Take 600 mg by mouth 2 (two) times daily.   Yes Historical Provider, MD  cholecalciferol (VITAMIN D) 1000 UNITS tablet Take 1,000 Units by mouth at bedtime.   Yes Historical Provider, MD  Coenzyme Q10 (CO Q 10) 100 MG CAPS Take 100 mg by mouth at bedtime.   Yes Historical Provider, MD  diclofenac (VOLTAREN) 50 MG EC tablet Take 50 mg by mouth Twice daily. 11/09/11  Yes Historical Provider, MD  docusate sodium (COLACE) 100 MG capsule Take 200 mg by mouth 2 (two) times daily.    Yes Historical Provider, MD  gabapentin (NEURONTIN) 300 MG capsule Take 600 mg by mouth every 12 (twelve) hours.   Yes Historical Provider, MD  hydrochlorothiazide (HYDRODIURIL) 25 MG tablet Take 25 mg by mouth Daily. 12/23/11  Yes Historical Provider, MD  HYDROcodone-acetaminophen (NORCO) 10-325 MG per tablet Take 1 tablet by mouth every 4 (four) hours as needed for moderate pain.    Yes Historical Provider, MD   levothyroxine (SYNTHROID, LEVOTHROID) 150 MCG tablet Take 150 mcg by mouth daily before breakfast. 12/27/11  Yes Historical Provider, MD  losartan (COZAAR) 100 MG tablet Take 100 mg by mouth Daily. 12/23/11  Yes Historical Provider, MD  Multiple Vitamin (MULTIVITAMIN WITH MINERALS) TABS Take 1 tablet by mouth at bedtime.   Yes Historical Provider, MD  Omega-3 Fatty Acids (FISH OIL) 1200 MG CAPS Take 1,200 mg by mouth 2 (two) times daily.   Yes Historical Provider, MD  omeprazole (PRILOSEC) 20 MG capsule Take 20 mg by mouth Daily. 12/23/11  Yes Historical Provider, MD  oxymetazoline (AFRIN) 0.05 % nasal spray Place 1 spray into both nostrils 2 (two) times daily.   Yes Historical Provider, MD  pravastatin (PRAVACHOL) 40 MG tablet Take 40 mg by mouth daily. 10/29/14  Yes Historical Provider, MD  predniSONE (DELTASONE) 10 MG tablet Take 10 mg by mouth Daily.  01/06/12  Yes Historical Provider, MD  senna (SENOKOT) 8.6 MG tablet Take 1-2 tablets by mouth 2 (two) times daily. 2 tabs in the morning and 1  tab at bedtime   Yes Historical Provider, MD  trolamine salicylate (ASPERCREME) 10 % cream Apply 1 application topically as needed for muscle pain.   Yes Historical Provider, MD  ZETIA 10 MG tablet Take 5 mg by mouth at bedtime.  10/15/11  Yes Historical Provider, MD  cyclobenzaprine (FLEXERIL) 10 MG tablet Take 0.5-1 tablets (5-10 mg total) by mouth 3 (three) times daily as needed for muscle spasms. Patient not taking: Reported on 01/31/2015 01/31/13   Jovita Gamma, MD  HYDROmorphone (DILAUDID) 2 MG tablet Take 1-2 tablets (2-4 mg total) by mouth every 4 (four) hours as needed for severe pain. Patient not taking: Reported on 01/31/2015 01/31/13   Jovita Gamma, MD  nitroGLYCERIN (NITROSTAT) 0.4 MG SL tablet Place 0.4 mg under the tongue every 5 (five) minutes as needed for chest pain.    Historical Provider, MD  oxyCODONE-acetaminophen (PERCOCET/ROXICET) 5-325 MG per tablet Take 1-2 tablets by mouth  every 4 (four) hours as needed for moderate pain. Patient not taking: Reported on 01/31/2015 01/31/13   Jovita Gamma, MD  polyethylene glycol powder (GLYCOLAX/MIRALAX) powder Take 17 g by mouth Once daily as needed. For constipation 11/02/11   Historical Provider, MD     Physical Exam:  Filed Vitals:   01/31/15 1100 01/31/15 1156 01/31/15 1159 01/31/15 1200  BP: 191/99   117/72  Pulse: 110   99  Temp:  100.3 F (37.9 C) 99 F (37.2 C)   TempSrc:  Rectal    Resp: 27   23  Height:      Weight:      SpO2: 95%   96%    Constitutional: Vital signs reviewed.  No acute distress HEENT: no pallor, no icterus, dry oral mucosa, no cervical lymphadenopathy Cardiovascular: RRR, S1 normal, S2 normal, no MRG Chest: CTAB, no wheezes, rales, or rhonchi Abdominal: Soft. Non-tender, non-distended, bowel sounds are normal, no guarding or rigidity  GU: no CVA or suprapubic tenderness, foley placed in ED Ext: warm, no edema Neurological: A&O x2 , non focal  Labs on Admission:  Basic Metabolic Panel:  Recent Labs Lab 01/31/15 1114  NA 139  K 3.9  CL 100*  CO2 28  GLUCOSE 116*  BUN 21*  CREATININE 1.28*  CALCIUM 9.7   Liver Function Tests:  Recent Labs Lab 01/31/15 1114  AST 52*  ALT 40  ALKPHOS 65  BILITOT 1.2  PROT 7.3  ALBUMIN 4.4   No results for input(s): LIPASE, AMYLASE in the last 168 hours. No results for input(s): AMMONIA in the last 168 hours. CBC:  Recent Labs Lab 01/31/15 1114  WBC 12.6*  NEUTROABS 9.1*  HGB 14.4  HCT 44.9  MCV 99.1  PLT 243   Cardiac Enzymes: No results for input(s): CKTOTAL, CKMB, CKMBINDEX, TROPONINI in the last 168 hours. BNP: Invalid input(s): POCBNP CBG: No results for input(s): GLUCAP in the last 168 hours.  Radiological Exams on Admission: Dg Chest 2 View  01/31/2015  CLINICAL DATA:  Chest pain, weakness for a few days. History of hypertension. EXAM: CHEST  2 VIEW COMPARISON:  01/18/2012 FINDINGS: Linear densities in the  lung bases likely reflect scarring or atelectasis. Heart is normal size. Mild tortuosity of the thoracic aorta. No effusions. No acute bony abnormality. IMPRESSION: Bibasilar linear scarring or atelectasis. Electronically Signed   By: Rolm Baptise M.D.   On: 01/31/2015 13:33   Ct Head Wo Contrast  01/31/2015  CLINICAL DATA:  Confusion today.  Initial encounter. EXAM: CT HEAD WITHOUT CONTRAST TECHNIQUE:  Contiguous axial images were obtained from the base of the skull through the vertex without intravenous contrast. COMPARISON:  None. FINDINGS: There is cortical atrophy noted and chronic microvascular ischemic change. No evidence of acute intracranial abnormality including hemorrhage, infarct, mass lesion, mass effect, midline shift or abnormal extra-axial fluid collection. No hydrocephalus or pneumocephalus. The calvarium is intact. IMPRESSION: No acute abnormality. Atrophy and chronic microvascular ischemic change. Electronically Signed   By: Inge Rise M.D.   On: 01/31/2015 14:01   Ct Abdomen Pelvis W Contrast  01/31/2015  CLINICAL DATA:  Right lower quadrant and flank pain for 2 days. Confusion. EXAM: CT ABDOMEN AND PELVIS WITH CONTRAST TECHNIQUE: Multidetector CT imaging of the abdomen and pelvis was performed using the standard protocol following bolus administration of intravenous contrast. CONTRAST:  124mL OMNIPAQUE IOHEXOL 300 MG/ML  SOLN COMPARISON:  08/16/2014 FINDINGS: Lower chest: Coronary atherosclerotic calcification. Ectatic ascending thoracic aorta, 3.8 cm diameter. Thickened interventricular septum at 2.3 cm. Subsegmental atelectasis or scarring in the left lower lobe. Old fractures of the right eighth, ninth, eleventh, and twelfth ribs. Old fractures of the left eleventh and twelfth ribs. Hepatobiliary: Mildly distended gallbladder with several small gallstones. No extrahepatic biliary dilatation. No focal hepatic lesion identified. Pancreas: Fatty atrophy of the pancreas. Spleen:  Unremarkable Adrenals/Urinary Tract: Adrenal glands unremarkable. Various renal cysts are present. There are several bilateral renal hyperdense lesions which do not exhibit significant change in density between portal venous and delayed phase images and as such probably represent complex cysts. We do not have precontrast images to further confirm and lack of enhancement and accordingly a Bosniak classification cannot be assigned. However, none of these lesions appears to significantly change in density between portal venous and delayed phase images which is somewhat reassuring. No stones are identified. This Foley catheter in the urinary bladder. Mild urinary bladder wall thickening with some faint linear low signal within the wall, but not low enough to definitively assigned gas density. Stomach/Bowel: The sigmoid colon diverticulitis for example on image 84 of series 5 with low grade inflammation of a diverticulum of the sigmoid colon adjacent to the cecum. The appendix appears normal. Underlying sigmoid colon diverticulosis is present. Prominent stool throughout the colon favors constipation. Vascular/Lymphatic: Aortoiliac atherosclerotic vascular disease. No pathologic adenopathy. Reproductive: Unremarkable Other: No supplemental non-categorized findings. Musculoskeletal: Atrophic portions of the right gluteus minimus and medius as well as the right gluteus maximus. Dextroconvex upper lumbar scoliosis with levoconvex lower lumbar scoliosis. Lumbar spondylosis and degenerative disc disease is relatively advanced, with intrathecal calcification likely a manifestation of a known arachnoiditis. There is thought to be considerable foraminal stenosis on the right at T12- L1, L2-3, L3-4, and L4-5; and on the left at L1-2, L2-3, L3-4, and L4-5. Degenerative findings of both hips with spurring of the femoral heads and acetabula. IMPRESSION: 1. Mild sigmoid colon diverticulitis.  Appendix normal. 2.  Prominent stool  throughout the colon favors constipation. 3. Various renal lesions of differing complexity, likely benign and complex cysts. Bosniak classification cannot be assigned due to the lack of precontrast imaging. If the patient has hematuria or other urinary tract symptoms, consider dedicated renal protocol MRI with and without contrast for definitive assessment. 4. Severe lumbar scoliosis, spondylosis, and degenerative disc disease, with intrathecal calcifications compatible with arachnoiditis, and multilevel impingement. 5. Other imaging findings of potential clinical significance: Coronary atherosclerosis. Left ventricular hypertrophy. Ectatic ascending thoracic aorta. Old bilateral rib fractures. Cholelithiasis. Fatty atrophy of the pancreas. Aortoiliac atherosclerotic vascular disease. Atrophic portions of the  rib right gluteal musculature. Degenerative arthropathy of both hips. Mild urinary bladder wall thickening which could reflect cystitis. Electronically Signed   By: Van Clines M.D.   On: 01/31/2015 16:36    EKG: Sinus tachycardia at 111, No ST-T changes, borderline prolonged QTC of 493  Assessment/Plan  Principal Problem:   Sepsis ([place]) Possibly associated with acute sigmoid diverticulitis and questionable cystitis. Sepsis pathway initiated in the ED. On empiric vancomycin and Zosyn. Follow blood cultures. UA unremarkable. -Elevated lactic acid resolved after of IV fluid bolus in the ED. Place on maintenance fluid.   Active Problems: Acute encephalopathy Possibly associated with underlying sepsis and dehydration. Head CT without acute findings. Monitor neuro-checks.   Acute kidney injury  secondary to dehydration and ? Cystitis/ BOO. Medication list also shows pt being on diclofenac.  Monitor with IV hydration. Hold ACEi and HCTZ . Avoid all nephrotoxic agents    Essential hypertension Elevated BP. Will resume BB and amlodipine. Hold ACEi and HCTZ  given AKI. Will place on empiric  hydralazine.     Constipation Resume home laxatives     Hypothyroidism Continue synthroid. Check TSH     Bladder outlet obstruction Possibly associated with cystitis and bladder wall thickening. Check renal ultrasound. Foley placed on admission.     GERD (gastroesophageal reflux disease) continue protonix.    Chronic back pain Multiple back surgeries and multilevel degenerative dis disease. Will hold narcotics for now given his encephalopathy. PT eval in am.      Diet: Low-sodium  DVT prophylaxis: Subcutaneous heparin   Code Status: full code Family Communication: discussed with wife at bedside Disposition Plan: Admit to telemetry.  [name] [facility] Pager [phone]  Total time spent on admission :70 minutes  If 7PM-7AM, please contact night-coverage [email or web address] Password [facility] 01/31/2015, 5:31 PM

## 2015-01-31 NOTE — Progress Notes (Signed)

## 2015-02-01 ENCOUNTER — Inpatient Hospital Stay (HOSPITAL_COMMUNITY): Payer: Medicare Other

## 2015-02-01 DIAGNOSIS — K5900 Constipation, unspecified: Secondary | ICD-10-CM

## 2015-02-01 DIAGNOSIS — K219 Gastro-esophageal reflux disease without esophagitis: Secondary | ICD-10-CM

## 2015-02-01 DIAGNOSIS — E039 Hypothyroidism, unspecified: Secondary | ICD-10-CM

## 2015-02-01 LAB — CBC
HCT: 37.9 % — ABNORMAL LOW (ref 39.0–52.0)
Hemoglobin: 12.5 g/dL — ABNORMAL LOW (ref 13.0–17.0)
MCH: 32.3 pg (ref 26.0–34.0)
MCHC: 33 g/dL (ref 30.0–36.0)
MCV: 97.9 fL (ref 78.0–100.0)
Platelets: 185 10*3/uL (ref 150–400)
RBC: 3.87 MIL/uL — ABNORMAL LOW (ref 4.22–5.81)
RDW: 14.5 % (ref 11.5–15.5)
WBC: 12.4 10*3/uL — ABNORMAL HIGH (ref 4.0–10.5)

## 2015-02-01 LAB — BASIC METABOLIC PANEL
Anion gap: 13 (ref 5–15)
BUN: 15 mg/dL (ref 6–20)
CO2: 21 mmol/L — ABNORMAL LOW (ref 22–32)
Calcium: 8.6 mg/dL — ABNORMAL LOW (ref 8.9–10.3)
Chloride: 104 mmol/L (ref 101–111)
Creatinine, Ser: 1.16 mg/dL (ref 0.61–1.24)
GFR calc Af Amer: >60 mL/min (ref >60)
GFR calc non Af Amer: 58 mL/min — ABNORMAL LOW (ref >60)
Glucose, Bld: 94 mg/dL (ref 65–99)
Potassium: 3.4 mmol/L — ABNORMAL LOW (ref 3.5–5.1)
Sodium: 138 mmol/L (ref 135–145)

## 2015-02-01 LAB — BLOOD GAS, ARTERIAL
Acid-base deficit: 2.4 mmol/L — ABNORMAL HIGH (ref 0.0–2.0)
Bicarbonate: 21.1 mEq/L (ref 20.0–24.0)
Drawn by: 331001
FIO2: 0.21
O2 Saturation: 94.5 %
Patient temperature: 98.5
TCO2: 22.1 mmol/L (ref 0–100)
pCO2 arterial: 31.8 mmHg — ABNORMAL LOW (ref 35.0–45.0)
pH, Arterial: 7.438 (ref 7.350–7.450)
pO2, Arterial: 74.5 mmHg — ABNORMAL LOW (ref 80.0–100.0)

## 2015-02-01 LAB — URINE CULTURE: Culture: NO GROWTH

## 2015-02-01 LAB — AMMONIA: Ammonia: 39 umol/L — ABNORMAL HIGH (ref 9–35)

## 2015-02-01 LAB — TSH: TSH: 1.226 u[IU]/mL (ref 0.350–4.500)

## 2015-02-01 LAB — GLUCOSE, CAPILLARY: Glucose-Capillary: 179 mg/dL — ABNORMAL HIGH (ref 65–99)

## 2015-02-01 LAB — HEMOGLOBIN AND HEMATOCRIT, BLOOD
HCT: 38.8 % — ABNORMAL LOW (ref 39.0–52.0)
Hemoglobin: 12.9 g/dL — ABNORMAL LOW (ref 13.0–17.0)

## 2015-02-01 LAB — T4, FREE: Free T4: 1.09 ng/dL (ref 0.61–1.12)

## 2015-02-01 MED ORDER — HYDROCODONE-ACETAMINOPHEN 5-325 MG PO TABS
1.0000 | ORAL_TABLET | Freq: Four times a day (QID) | ORAL | Status: DC | PRN
  Administered 2015-02-01: 1 via ORAL
  Filled 2015-02-01: qty 1

## 2015-02-01 NOTE — Progress Notes (Signed)
Pt was anxious throughout the day. He continued to fidget with his sheets and blankets. Pt denies any pain. Will continue to assess.

## 2015-02-01 NOTE — Progress Notes (Signed)
Pt agitated at this time per RN. Will attempt EEG 11/23, RN aware.

## 2015-02-01 NOTE — Progress Notes (Signed)
Upon talikng with wife she descibed patient as having episode of starring off, not responding to verbal stimili, non verbal activity while rolling tongue across lips.This episode was prior to patient having episode of confusion uncooperativeness after MRI. Patient's wife stated that she forgot to tell MD, she was shaken from witnessing patient with unexpected behavior. CM updated Dr Posey Pronto, he acknowledged, and stated he would place orders. Patient's bedside RN updated.

## 2015-02-01 NOTE — Progress Notes (Signed)
[facility] Progress Note    Patient: [name]     ZOX:[id number]  DOB: 1934/01/27     DOA: 01/31/2015 Date of Service: the patient was seen and examined on 02/01/2015 Day 1 of admission.  Subjective: Patient was calm and cooperative initially. Had one episode of agitation where he was confused. Also nursing reported that the patient had on and off episodes of waxing and waning alertness. Wife mentions the ptosis on the right is new the weakness on the right leg is chronic Nutrition: Able to tolerate oral diet Activity: Ambulating with physical therapy Last BM: 02/01/2015  Assessment and Plan: 1. Sepsis [facility])  Patient met criteria with fever and tachycardia lactic acidosis and leukocytosis with acute kidney injury. The patient presented with complaints of confusion. There is also complains of increased urination. Patient was started on vancomycin and Zosyn for suspected sepsis associated with UTI. CT of the abdomen was positive for mild sigmoid diverticulitis although the patient does not have any tenderness on examination. Chest x-ray is negative. Urine is also clear. Foley catheter was placed for urinary retention.  2. Acute encephalopathy. Etiology is currently unclear. CT of the head is negative. MRI brain negative as well. TSH free T4 ABG also unremarkable. Ammonia level is not significantly elevated. Patient does not have any focal deficit on examination. Withdrawal from opioids versus delirium. We will continue to closely monitor. Check EEG.  3. Urinary retention. Foley catheter was inserted in the ED for urinary retention. Will need to attempt voiding trial once mentation improves  4. Essential hypertension. Continuing home blood pressure medications.  5 constipation. Continuing stool softener.  DVT Prophylaxis: subcutaneous Heparin Nutrition: Regular diet  Advance goals of care discussion: Full code  Brief Summary of Hospitalization:  HPI: Patient  doesn't with complaints of back pain ongoing for 3 days as well as abdominal bloating. Also there was confusion as well as increasing urination. Daily update: MRI and blood work are negative on 02/01/2015. Consultants: None Procedures: None Antibiotics: Vancomycin and Zosyn 01/31/2015 start date  Family Communication: family was present at bedside, opportunity was given to ask question and all questions were answered satisfactorily at the time of interview.  Disposition:  Expected discharge date: 02/03/2015 Barriers to safe discharge: Improvement in mentation   Intake/Output Summary (Last 24 hours) at 02/01/15 1823 Last data filed at 02/01/15 1100  Gross per 24 hour  Intake 508.33 ml  Output   1150 ml  Net -641.67 ml   Filed Weights   01/31/15 1057  Weight: 92.534 kg (204 lb)    Objective: Physical Exam: Filed Vitals:   01/31/15 2245 02/01/15 0615 02/01/15 1138 02/01/15 1140  BP:  137/65 140/62 140/62  Pulse:  92 108   Temp: 98.7 F (37.1 C) 98.5 F (36.9 C)    TempSrc: Oral Oral    Resp: 18 18    Height:      Weight:      SpO2: 98% 91%       General: Appear in mild distress, no Rash; Oral Mucosa moist. Cardiovascular: S1 and S2 Present, no Murmur, no JVD Respiratory: Bilateral Air entry present and Clear to Auscultation, no Crackles, no wheezes Abdomen: Bowel Sound present, Soft and no tenderness Extremities: no Pedal edema, no calf tenderness Neurology: Right-sided foot drop and right ptosis  Data Reviewed: CBC:  Recent Labs Lab 01/31/15 1114 02/01/15 0014 02/01/15 0526  WBC 12.6*  --  12.4*  NEUTROABS 9.1*  --   --   HGB  14.4 12.9* 12.5*  HCT 44.9 38.8* 37.9*  MCV 99.1  --  97.9  PLT 243  --  144   Basic Metabolic Panel:  Recent Labs Lab 01/31/15 1114 02/01/15 0526  NA 139 138  K 3.9 3.4*  CL 100* 104  CO2 28 21*  GLUCOSE 116* 94  BUN 21* 15  CREATININE 1.28* 1.16  CALCIUM 9.7 8.6*   Liver Function Tests:  Recent Labs Lab  01/31/15 1114  AST 52*  ALT 40  ALKPHOS 65  BILITOT 1.2  PROT 7.3  ALBUMIN 4.4   No results for input(s): LIPASE, AMYLASE in the last 168 hours.  Recent Labs Lab 01/31/15 1906 02/01/15 1446  AMMONIA 19 39*    Cardiac Enzymes: No results for input(s): CKTOTAL, CKMB, CKMBINDEX, TROPONINI in the last 168 hours. BNP (last 3 results) No results for input(s): BNP in the last 8760 hours.  ProBNP (last 3 results) No results for input(s): PROBNP in the last 8760 hours.   CBG:  Recent Labs Lab 02/01/15 1137  GLUCAP 179*    Recent Results (from the past 240 hour(s))  Culture, blood (routine x 2)     Status: None (Preliminary result)   Collection Time: 01/31/15 11:52 AM  Result Value Ref Range Status   Specimen Description BLOOD RIGHT HAND  Final   Special Requests BOTTLES DRAWN AEROBIC AND ANAEROBIC 5CC  Final   Culture NO GROWTH < 24 HOURS  Final   Report Status PENDING  Incomplete  Culture, blood (routine x 2)     Status: None (Preliminary result)   Collection Time: 01/31/15 11:53 AM  Result Value Ref Range Status   Specimen Description BLOOD RIGHT ANTECUBITAL  Final   Special Requests BOTTLES DRAWN AEROBIC AND ANAEROBIC 5ML  Final   Culture NO GROWTH < 24 HOURS  Final   Report Status PENDING  Incomplete  Urine culture     Status: None   Collection Time: 01/31/15  3:42 PM  Result Value Ref Range Status   Specimen Description URINE, CATHETERIZED  Final   Special Requests NONE  Final   Culture NO GROWTH 1 DAY  Final   Report Status 02/01/2015 FINAL  Final     Studies: Mr Brain Wo Contrast  02/01/2015  CLINICAL DATA:  Acute encephalopathy. Confusion beginning 3 days ago. Sigmoid diverticulitis. EXAM: MRI HEAD WITHOUT CONTRAST TECHNIQUE: Multiplanar, multiecho pulse sequences of the brain and surrounding structures were obtained without intravenous contrast. COMPARISON:  CT head without contrast 01/31/2015. FINDINGS: The examination had to be discontinued prior to  completion due to patient confusion and refusal for further imaging. The diffusion-weighted imaging demonstrates no acute or subacute infarction. Midline structures are unremarkable. The axial T2 weighted images are significantly degraded by patient motion. Flow is grossly intact within the major intracranial arteries. Fusion of the upper cervical spine is evident beginning at C3-4. Grade 1 anterolisthesis is present at C2-3. IMPRESSION: 1. No acute intracranial abnormality on this limited study. 2. Grade 1 anterolisthesis at C2-3. Electronically Signed   By: San Morelle M.D.   On: 02/01/2015 10:39   US Renal  01/31/2015  CLINICAL DATA:  Acute onset of flank pain and acute renal failure. Sepsis. Initial encounter. EXAM: RENAL / URINARY TRACT ULTRASOUND COMPLETE COMPARISON:  CT of the abdomen and pelvis performed earlier today at 4:10 p.m. FINDINGS: Right Kidney: Length: 11.3 cm. Mildly increased parenchymal echogenicity noted. A small 1.8 cm cyst is noted at the upper pole of the right kidney. No hydronephrosis  visualized. Left Kidney: Length: 12.2 cm. Mildly increased parenchymal echogenicity noted. Multiple left renal cysts are seen, measuring up to 6.6 x 5.8 x 6.2 cm, demonstrating slight peripheral complexity. No hydronephrosis visualized. Bladder: Appears normal for degree of bladder distention. IMPRESSION: 1. No evidence of hydronephrosis. 2. Mildly increased renal parenchymal echogenicity likely reflects medical renal disease. 3. Bilateral renal cysts noted, larger on the left. Electronically Signed   By: Garald Balding M.D.   On: 01/31/2015 23:37     Scheduled Meds: . amLODipine  5 mg Oral Daily  . atenolol  25 mg Oral Daily  . calcium carbonate  1,250 mg Oral BID  . cholecalciferol  1,000 Units Oral QHS  . docusate sodium  200 mg Oral BID  . ezetimibe  5 mg Oral QHS  . heparin  5,000 Units Subcutaneous 3 times per day  . levothyroxine  150 mcg Oral QAC breakfast  . multivitamin with  minerals  1 tablet Oral QHS  . omega-3 acid ethyl esters  1 g Oral Daily  . pantoprazole  40 mg Oral Daily  . piperacillin-tazobactam (ZOSYN)  IV  3.375 g Intravenous 3 times per day  . polyethylene glycol powder  17 g Oral Daily  . pravastatin  40 mg Oral Daily  . predniSONE  10 mg Oral Q breakfast  . senna  2 tablet Oral q morning - 10a   And  . senna  1 tablet Oral QHS  . sodium chloride  3 mL Intravenous Q12H  . vancomycin  750 mg Intravenous Q12H   Continuous Infusions: . sodium chloride 100 mL/hr at 02/01/15 5107   Time spent: 35 minutes  Author: Berle Mull, MD [facility] Pager: [phone] 02/01/2015 6:23 PM  If 7PM-7AM, please contact night-coverage at [email or web address], password [facility]

## 2015-02-01 NOTE — Progress Notes (Signed)
Pt became very anxious, uncoopperative, and insisting to leave. Dr. Posey Pronto paged and notified of change in behavior.

## 2015-02-01 NOTE — Care Management Note (Addendum)
Case Management Note  Patient Details  Name: [name] MRN: [id number] Date of Birth: 1933-06-25  Subjective/Objective:                  Date-02-01-15 Initial Assessment  Spoke with patient at the bedside along with wife.  Introduced self as Tourist information centre manager and explained role in discharge planning and how to be reached.  Verified patient lives in [place] at home with wife.  Uncertain of disposition at this time. Wife expressed concern of being able to care for patient after discharge. Patient is confused and uncooperative, admitted for infection, unsure if this is the etiology of confusion. Wife states that she is unable o provide fill time supervision at home, and they have no family in town. PT eval pending. Further evaluation still pending.  Patient has DME rolator x3. Expressed potential need for no other DME.  Patient denied needing help with their medication.  Patient is driven by wife to MD appointments.  Verified patient has PCP Dr Sharlett Iles. Patient states they currently receive [place] services through no one.    Plan: CM will continue to follow for discharge planning and [facility] resources.   Carles Collet RN BSN CM [phone]   Action/Plan:  02-02-15 Referral called in to [facility] for [facility] RN PT SW. MD to place order closer to discharge.  Expected Discharge Date:                  Expected Discharge Plan:  [place]  In-House Referral:  Clinical Social Work  Discharge planning Services  CM Consult  Post Acute Care Choice:    Choice offered to:     DME Arranged:    DME Agency:     HH Arranged:    [place] Agency:     Status of Service:  In process, will continue to follow  Medicare Important Message Given:    Date Medicare IM Given:    Medicare IM give by:    Date Additional Medicare IM Given:    Additional Medicare Important Message give by:     If discussed at [place] of Stay Meetings, dates discussed:    Additional Comments:  Carles Collet, RN 02/01/2015, 1:30 PM

## 2015-02-01 NOTE — Evaluation (Signed)
Physical Therapy Evaluation Patient Details Name: [name] MRN: [id number] DOB: 10/11/1933 Today's Date: 02/01/2015   History of Present Illness  Pt adm with sepsis and AMS.   Clinical Impression  Pt admitted with above diagnosis and presents to PT with functional limitations due to deficits listed below (See PT problem list). Pt needs skilled PT to maximize independence and safety to allow discharge to home with 24 hour assist/supervision assuming his mental status and agitation improve.      Follow Up Recommendations Home health PT;Supervision/Assistance - 24 hour    Equipment Recommendations  None recommended by PT    Recommendations for Other Services       Precautions / Restrictions Precautions Precautions: Fall Restrictions Weight Bearing Restrictions: No      Mobility  Bed Mobility Overal bed mobility: Needs Assistance Bed Mobility: Sit to Supine       Sit to supine: Min guard   General bed mobility comments: assist for safety  Transfers Overall transfer level: Needs assistance Equipment used: 1 person hand held assist;Rolling walker (2 wheeled) Transfers: Sit to/from [facility] Sit to Stand: Min assist Stand pivot transfers: Min assist       General transfer comment: Assist to bring hips up and for balance.  Ambulation/Gait Ambulation/Gait assistance: Min assist Ambulation Distance (Feet): 250 Feet Assistive device: Rolling walker (2 wheeled) Gait Pattern/deviations: Step-through pattern;Decreased stride length;Decreased dorsiflexion - right;Steppage Gait velocity: decr Gait velocity interpretation: Below normal speed for age/gender General Gait Details: Pt very agitated and wanting to leave. Amb in hall and attempting to go through alarmed door. Able to redirect and head back toward room with max verbal cues. When fatigued pt eventually persuaded to sit in w/c and taken back to room.  Stairs            Wheelchair  Mobility    Modified Rankin (Stroke Patients Only)       Balance Overall balance assessment: Needs assistance Sitting-balance support: No upper extremity supported;Feet supported Sitting balance-Leahy Scale: Fair     Standing balance support: Single extremity supported Standing balance-Leahy Scale: Poor Standing balance comment: min A for static balance                             Pertinent Vitals/Pain Pain Assessment: Faces Faces Pain Scale: No hurt    Home Living Family/patient expects to be discharged to:: Private residence Living Arrangements: Spouse/significant other Available Help at Discharge: Family Type of Home: House Home Access: Stairs to enter   [employer] of Steps: 1 (small stoop) Home Layout: One level Home Equipment: Walker - 4 wheels;Cane - single point (3 wheeled rollator as well)      Prior Function Level of Independence: Independent with assistive device(s)         Comments: Used 3 wheeled rollator or 4 wheeled rollator.     Hand Dominance        Extremity/Trunk Assessment   Upper Extremity Assessment: Generalized weakness (Pt not cooperative to formal testing)           Lower Extremity Assessment: Generalized weakness (Pt not cooperative to formal testing)         Communication      Cognition Arousal/Alertness: Awake/alert Behavior During Therapy: Agitated Overall Cognitive Status: Impaired/Different from baseline Area of Impairment: Orientation;Attention;Following commands;Safety/judgement;Awareness;Problem solving Orientation Level: Disoriented to;Time;Situation Current Attention Level: Sustained Memory: Decreased recall of precautions;Decreased short-term memory Following Commands: Follows one step commands inconsistently  Safety/Judgement: Decreased awareness of safety;Decreased awareness of deficits   Problem Solving: Requires verbal cues;Requires tactile cues      General Comments       Exercises        Assessment/Plan    PT Assessment Patient needs continued PT services  PT Diagnosis Difficulty walking;Generalized weakness;Altered mental status   PT Problem List Decreased strength;Decreased activity tolerance;Decreased balance;Decreased mobility;Decreased cognition;Decreased knowledge of use of DME  PT Treatment Interventions DME instruction;Gait training;Functional mobility training;Therapeutic activities;Therapeutic exercise;Balance training;Cognitive remediation;Patient/family education   PT Goals (Current goals can be found in the Care Plan section) Acute Rehab PT Goals Patient Stated Goal: Pt wants to leave PT Goal Formulation: With patient Time For Goal Achievement: 02/08/15 Potential to Achieve Goals: Good    Frequency Min 3X/week   Barriers to discharge        Co-evaluation               End of Session   Activity Tolerance: Treatment limited secondary to agitation Patient left: in bed;with call bell/phone within reach;with nursing/sitter in room;with family/visitor present Nurse Communication: Mobility status         Time: 1110-1143 PT Time Calculation (min) (ACUTE ONLY): 33 min   Charges:   PT Evaluation $Initial PT Evaluation Tier I: 1 Procedure PT Treatments $Gait Training: 8-22 mins   PT G Codes:        Houston Zapien 02-14-2015, 3:56 PM [facility] PT [phone]

## 2015-02-01 NOTE — Progress Notes (Signed)
Utilization review completed. Kalla Watson, RN, BSN. 

## 2015-02-02 ENCOUNTER — Inpatient Hospital Stay (HOSPITAL_COMMUNITY): Payer: Medicare Other

## 2015-02-02 LAB — CBC WITH DIFFERENTIAL/PLATELET
Basophils Absolute: 0 10*3/uL (ref 0.0–0.1)
Basophils Relative: 0 %
Eosinophils Absolute: 0.1 10*3/uL (ref 0.0–0.7)
Eosinophils Relative: 1 %
HCT: 35.7 % — ABNORMAL LOW (ref 39.0–52.0)
Hemoglobin: 12.2 g/dL — ABNORMAL LOW (ref 13.0–17.0)
Lymphocytes Relative: 12 %
Lymphs Abs: 1.4 10*3/uL (ref 0.7–4.0)
MCH: 32.7 pg (ref 26.0–34.0)
MCHC: 34.2 g/dL (ref 30.0–36.0)
MCV: 95.7 fL (ref 78.0–100.0)
Monocytes Absolute: 1.4 10*3/uL — ABNORMAL HIGH (ref 0.1–1.0)
Monocytes Relative: 12 %
Neutro Abs: 8.5 10*3/uL — ABNORMAL HIGH (ref 1.7–7.7)
Neutrophils Relative %: 75 %
Platelets: 191 10*3/uL (ref 150–400)
RBC: 3.73 MIL/uL — ABNORMAL LOW (ref 4.22–5.81)
RDW: 14.3 % (ref 11.5–15.5)
WBC: 11.4 10*3/uL — ABNORMAL HIGH (ref 4.0–10.5)

## 2015-02-02 LAB — BASIC METABOLIC PANEL
Anion gap: 10 (ref 5–15)
BUN: 13 mg/dL (ref 6–20)
CO2: 23 mmol/L (ref 22–32)
Calcium: 8.7 mg/dL — ABNORMAL LOW (ref 8.9–10.3)
Chloride: 107 mmol/L (ref 101–111)
Creatinine, Ser: 1.31 mg/dL — ABNORMAL HIGH (ref 0.61–1.24)
GFR calc Af Amer: 58 mL/min — ABNORMAL LOW (ref >60)
GFR calc non Af Amer: 50 mL/min — ABNORMAL LOW (ref >60)
Glucose, Bld: 98 mg/dL (ref 65–99)
Potassium: 3 mmol/L — ABNORMAL LOW (ref 3.5–5.1)
Sodium: 140 mmol/L (ref 135–145)

## 2015-02-02 LAB — PROTIME-INR
INR: 1.15 (ref 0.00–1.49)
Prothrombin Time: 14.9 seconds (ref 11.6–15.2)

## 2015-02-02 LAB — MAGNESIUM: Magnesium: 1.7 mg/dL (ref 1.7–2.4)

## 2015-02-02 MED ORDER — METRONIDAZOLE 500 MG PO TABS
500.0000 mg | ORAL_TABLET | Freq: Three times a day (TID) | ORAL | Status: DC
  Administered 2015-02-02 – 2015-02-05 (×10): 500 mg via ORAL
  Filled 2015-02-02 (×10): qty 1

## 2015-02-02 MED ORDER — LORAZEPAM 2 MG/ML IJ SOLN
0.5000 mg | Freq: Once | INTRAMUSCULAR | Status: AC
  Administered 2015-02-02: 0.5 mg via INTRAVENOUS
  Filled 2015-02-02: qty 1

## 2015-02-02 MED ORDER — ASPIRIN EC 81 MG PO TBEC
81.0000 mg | DELAYED_RELEASE_TABLET | Freq: Every day | ORAL | Status: DC
  Administered 2015-02-02 – 2015-02-04 (×3): 81 mg via ORAL
  Filled 2015-02-02 (×3): qty 1

## 2015-02-02 MED ORDER — MAGNESIUM SULFATE 2 GM/50ML IV SOLN
2.0000 g | Freq: Once | INTRAVENOUS | Status: AC
  Administered 2015-02-02: 2 g via INTRAVENOUS
  Filled 2015-02-02: qty 50

## 2015-02-02 MED ORDER — CIPROFLOXACIN HCL 500 MG PO TABS
500.0000 mg | ORAL_TABLET | Freq: Two times a day (BID) | ORAL | Status: DC
  Administered 2015-02-02 – 2015-02-05 (×7): 500 mg via ORAL
  Filled 2015-02-02 (×7): qty 1

## 2015-02-02 MED ORDER — LABETALOL HCL 5 MG/ML IV SOLN
5.0000 mg | Freq: Once | INTRAVENOUS | Status: AC
  Administered 2015-02-02: 5 mg via INTRAVENOUS
  Filled 2015-02-02: qty 4

## 2015-02-02 MED ORDER — POTASSIUM CHLORIDE IN NACL 40-0.9 MEQ/L-% IV SOLN
INTRAVENOUS | Status: DC
  Administered 2015-02-02 (×2): 100 mL/h via INTRAVENOUS
  Administered 2015-02-04: 75 mL/h via INTRAVENOUS
  Filled 2015-02-02 (×7): qty 1000

## 2015-02-02 NOTE — Progress Notes (Addendum)
[facility] Progress Note    Patient: [name]     MRN:[id number]  DOB: 04/07/1933     DOA: 01/31/2015 Date of Service: the patient was seen and examined on 02/02/2015 Day 2 of admission.  Subjective: Pt was  Confused  last night ,kept  pulling off his iv line and iv meds off,   overnight,  continuously trying to climb out of bed and picking at IV lines, wife concerned about narcotic withdrawal  Nutrition: Able to tolerate oral diet Activity: Ambulating with physical therapy Last BM: 02/01/2015  Assessment and Plan: 1. Sepsis (HCC)  Patient met criteria with fever and tachycardia lactic acidosis and leukocytosis with acute kidney injury upon admission, now improving. Currently on on vancomycin and Zosyn for suspected sepsis associated with  acute sigmoid diverticulitis. Switch to by mouth ciprofloxacin/Flagyl if not nauseous today CT of the abdomen was positive for mild sigmoid diverticulitis although the patient does not have any tenderness on examination. Chest x-ray is negative. Urine is also clear. Foley catheter was placed for urinary retention. Continue gentle IV hydration  2. Acute encephalopathy. Patient's wife reports baseline intermittent delirium and memory issues for the last 2 years which has never been evaluated by PCP or neurology CT of the head is negative. MRI brain negative as well. TSH free T4 ABG also unremarkable. Ammonia level is not significantly elevated. EEG pending Withdrawal from opioids versus delirium, patient takes Vicodin at home.  Physical therapy recommends home health and 24-hour supervision Patient needs outpatient neurology evaluation for Parkinson's/dementia  3. Urinary retention. Foley catheter was inserted in the ED for urinary retention. Will need to attempt voiding trial once mentation improves Attempted DC Foley today once the patient starts ambulating   4. Essential hypertension. Continuing home blood pressure  medications.  5 constipation. Continuing stool softener.  6. Hypokalemia replete    DVT Prophylaxis: subcutaneous Heparin Nutrition: Regular diet  Advance goals of care discussion: Full code  Brief Summary of Hospitalization:  HPI: Patient doesn't with complaints of back pain ongoing for 3 days as well as abdominal bloating. Also there was confusion as well as increasing urination. Daily update: MRI and blood work are negative on 02/01/2015. Consultants: None Procedures: None Antibiotics: Vancomycin and Zosyn 01/31/2015 start date  Family Communication: family was present at bedside, opportunity was given to ask question and all questions were answered satisfactorily at the time of interview.  Disposition:  Expected discharge date: 02/03/2015 Barriers to safe discharge: Improvement in mentation   Intake/Output Summary (Last 24 hours) at 02/02/15 0919 Last data filed at 02/02/15 0914  Gross per 24 hour  Intake   2200 ml  Output    250 ml  Net   1950 ml   Filed Weights   01/31/15 1057  Weight: 92.534 kg (204 lb)    Objective: Physical Exam: Filed Vitals:   02/02/15 0216 02/02/15 0245 02/02/15 0453 02/02/15 0644  BP: 178/62 158/105 147/98 145/97  Pulse: 125 92 111 108  Temp:   98.6 F (37 C)   TempSrc:      Resp:   18   Height:      Weight:      SpO2: 98% 100%       General: Appear in mild distress, no Rash; Oral Mucosa moist. Cardiovascular: S1 and S2 Present, no Murmur, no JVD Respiratory: Bilateral Air entry present and Clear to Auscultation, no Crackles, no wheezes Abdomen: Bowel Sound present, Soft and no tenderness Extremities: no Pedal edema, no calf   tenderness Neurology: Right-sided foot drop and right ptosis  Data Reviewed: CBC:  Recent Labs Lab 01/31/15 1114 02/01/15 0014 02/01/15 0526 02/02/15 0509  WBC 12.6*  --  12.4* 11.4*  NEUTROABS 9.1*  --   --  8.5*  HGB 14.4 12.9* 12.5* 12.2*  HCT 44.9 38.8* 37.9* 35.7*  MCV 99.1  --  97.9  95.7  PLT 243  --  185 704   Basic Metabolic Panel:  Recent Labs Lab 01/31/15 1114 02/01/15 0526 02/02/15 0509  NA 139 138 140  K 3.9 3.4* 3.0*  CL 100* 104 107  CO2 28 21* 23  GLUCOSE 116* 94 98  BUN 21* 15 13  CREATININE 1.28* 1.16 1.31*  CALCIUM 9.7 8.6* 8.7*  MG  --   --  1.7   Liver Function Tests:  Recent Labs Lab 01/31/15 1114  AST 52*  ALT 40  ALKPHOS 65  BILITOT 1.2  PROT 7.3  ALBUMIN 4.4   No results for input(s): LIPASE, AMYLASE in the last 168 hours.  Recent Labs Lab 01/31/15 1906 02/01/15 1446  AMMONIA 19 39*    Cardiac Enzymes: No results for input(s): CKTOTAL, CKMB, CKMBINDEX, TROPONINI in the last 168 hours. BNP (last 3 results) No results for input(s): BNP in the last 8760 hours.  ProBNP (last 3 results) No results for input(s): PROBNP in the last 8760 hours.   CBG:  Recent Labs Lab 02/01/15 1137  GLUCAP 179*    Recent Results (from the past 240 hour(s))  Culture, blood (routine x 2)     Status: None (Preliminary result)   Collection Time: 01/31/15 11:52 AM  Result Value Ref Range Status   Specimen Description BLOOD RIGHT HAND  Final   Special Requests BOTTLES DRAWN AEROBIC AND ANAEROBIC 5CC  Final   Culture NO GROWTH < 24 HOURS  Final   Report Status PENDING  Incomplete  Culture, blood (routine x 2)     Status: None (Preliminary result)   Collection Time: 01/31/15 11:53 AM  Result Value Ref Range Status   Specimen Description BLOOD RIGHT ANTECUBITAL  Final   Special Requests BOTTLES DRAWN AEROBIC AND ANAEROBIC 5ML  Final   Culture NO GROWTH < 24 HOURS  Final   Report Status PENDING  Incomplete  Urine culture     Status: None   Collection Time: 01/31/15  3:42 PM  Result Value Ref Range Status   Specimen Description URINE, CATHETERIZED  Final   Special Requests NONE  Final   Culture NO GROWTH 1 DAY  Final   Report Status 02/01/2015 FINAL  Final     Studies: Mr Brain Wo Contrast  02/01/2015  CLINICAL DATA:  Acute  encephalopathy. Confusion beginning 3 days ago. Sigmoid diverticulitis. EXAM: MRI HEAD WITHOUT CONTRAST TECHNIQUE: Multiplanar, multiecho pulse sequences of the brain and surrounding structures were obtained without intravenous contrast. COMPARISON:  CT head without contrast 01/31/2015. FINDINGS: The examination had to be discontinued prior to completion due to patient confusion and refusal for further imaging. The diffusion-weighted imaging demonstrates no acute or subacute infarction. Midline structures are unremarkable. The axial T2 weighted images are significantly degraded by patient motion. Flow is grossly intact within the major intracranial arteries. Fusion of the upper cervical spine is evident beginning at C3-4. Grade 1 anterolisthesis is present at C2-3. IMPRESSION: 1. No acute intracranial abnormality on this limited study. 2. Grade 1 anterolisthesis at C2-3. Electronically Signed   By: San Morelle M.D.   On: 02/01/2015 10:39  Scheduled Meds: . amLODipine  5 mg Oral Daily  . atenolol  25 mg Oral Daily  . calcium carbonate  1,250 mg Oral BID  . cholecalciferol  1,000 Units Oral QHS  . docusate sodium  200 mg Oral BID  . ezetimibe  5 mg Oral QHS  . heparin  5,000 Units Subcutaneous 3 times per day  . levothyroxine  150 mcg Oral QAC breakfast  . magnesium sulfate 1 - 4 g bolus IVPB  2 g Intravenous Once  . multivitamin with minerals  1 tablet Oral QHS  . omega-3 acid ethyl esters  1 g Oral Daily  . pantoprazole  40 mg Oral Daily  . piperacillin-tazobactam (ZOSYN)  IV  3.375 g Intravenous 3 times per day  . polyethylene glycol powder  17 g Oral Daily  . pravastatin  40 mg Oral Daily  . predniSONE  10 mg Oral Q breakfast  . senna  2 tablet Oral q morning - 10a   And  . senna  1 tablet Oral QHS  . sodium chloride  3 mL Intravenous Q12H  . vancomycin  750 mg Intravenous Q12H   Continuous Infusions: . 0.9 % NaCl with KCl 40 mEq / L     Time spent: 35  minutes  Author: Pranav Patel, MD [facility] Pager: [phone] 02/02/2015 9:19 AM  If 7PM-7AM, please contact night-coverage at [email or web address], password [email or web address]  

## 2015-02-02 NOTE — Clinical Social Work Note (Signed)
Clinical Social Work Assessment  Patient Details  Name: [name] MRN: [id number] Date of Birth: 1934/01/24  Date of referral:  02/02/15               Reason for consult:  Facility Placement                Permission sought to share information with:  Facility Sport and exercise psychologist, Family Supports Permission granted to share information::  Yes, Verbal Permission Granted  Name::     [name] (Patient disoriented, so spoke w/ wife, main support. )  Agency::     Relationship::  Wife  Contact Information:  [phone]  Housing/Transportation Living arrangements for the past 2 months:  Single Family Home Source of Information:  Patient, Spouse Patient Interpreter Needed:  None Criminal Activity/Legal Involvement Pertinent to Current Situation/Hospitalization:  No - Comment as needed Significant Relationships:  Spouse Lives with:  Spouse Do you feel safe going back to the place where you live?  No Need for family participation in patient care:  Yes (Comment)  Care giving concerns:  CSW received referral for possible SNF placement at time of discharge. CSW met with patient and patient's wife at bedside regarding PT recommendation of SNF placement at time of discharge. Per patient's wife, patient's wife feels unsure if she is able to care for patient at their home given patient's current physical needs and fall risk. Patient and patient's wife expressed understanding of PT recommendation and may be agreeable to SNF placement at time of discharge or to discharge home with home health. CSW to continue to follow and assist with discharge planning needs.   Social Worker assessment / plan:  Spoke with patient and his wife concerning possibility of rehab at [facility] before returning home versus going home w/ home health. Pt's wife would like CSW to search for SNF possibilities just in case patient does not get any stronger at time of discharge.   Employment status:  Retired Radiation protection practitioner:  Medicare PT Recommendations:  [place] / Referral to community resources:  [place]  Patient/Family's Response to care:  Patient and her husband recognize the possible need for rehab before returning home and are agreeable to a SNF in [place] if necessary. Patient and patient's wife have not decided whether or not they would like home health instead of a SNF.    Patient/Family's Understanding of and Emotional Response to Diagnosis, Current Treatment, and Prognosis:  Patient is realistic regarding current needs. No questions/concerns about plan or treatment at this time.    Emotional Assessment Appearance:  Appears stated age Attitude/Demeanor/Rapport:    Affect (typically observed):  Accepting, Appropriate, Withdrawn Orientation:  Oriented to Self Alcohol / Substance use:  Not Applicable Psych involvement (Current and /or in the community):  No (Comment)  Discharge Needs  Concerns to be addressed:  Care Coordination Readmission within the last 30 days:  No Current discharge risk:  None Barriers to Discharge:  Continued Medical Work up   [employer], LCSW 02/02/2015, 5:05 PM

## 2015-02-02 NOTE — Evaluation (Signed)
Occupational Therapy Evaluation Patient Details Name: [name] MRN: [id number] DOB: [birth year] Today's Date: 02/02/2015    History of Present Illness Pt admitted with sepsis and AMS.    Clinical Impression   Pt admitted with above. Pt independent with ADLs, PTA. Feel pt will benefit from acute OT to increase independence prior to d/c. Recommending SNF at this time as wife is concerned with taking pt home.     Follow Up Recommendations  SNF    Equipment Recommendations  Other (comment) (defer to next venue)    Recommendations for Other Services       Precautions / Restrictions Precautions Precautions: Fall Restrictions Weight Bearing Restrictions: No      Mobility Bed Mobility Overal bed mobility: Needs Assistance Bed Mobility: Supine to Sit     Supine to sit: Supervision   General bed mobility comments: cues given  Transfers Overall transfer level: Needs assistance Equipment used: Rolling walker (2 wheeled)   Sit to Stand: Min guard           Balance  Min guard at sink-pt unsteady. Min guard-Min assist for ambulation.                   ADL Overall ADL's : Needs assistance/impaired     Grooming: Wash/dry hands;Min guard;Standing               Lower Body Dressing: Sit to/from stand;Minimal assistance Lower Body Dressing Details (indicate cue type and reason): pt able to reach both feet  Toilet Transfer: Minimal assistance;Ambulation;RW (sit to stand from bed-Min guard)   Toileting- Clothing Manipulation and Hygiene: Moderate assistance;Sit to/from stand Toileting - Clothing Manipulation Details (indicate cue type and reason): pt wiped 1x but OT assisted in cleaning thoroughly as there was quite a bit of stool and applied cream     Functional mobility during ADLs: Rolling walker;Minimal assistance (Min guard-Min assist)       Vision     Perception     Praxis      Pertinent Vitals/Pain Pain Assessment: No/denies pain (at  end of session)     Hand Dominance     Extremity/Trunk Assessment Upper Extremity Assessment Upper Extremity Assessment: Overall WFL for tasks assessed   Lower Extremity Assessment Lower Extremity Assessment: Defer to PT evaluation       Communication Communication Communication: No difficulties   Cognition Arousal/Alertness: Awake/alert Behavior During Therapy: WFL for tasks assessed/performed Overall Cognitive Status: Impaired/Different from baseline Area of Impairment: Orientation;Problem solving Orientation Level: Disoriented to;Time     Problem Solving: Slow processing;Requires verbal cues     General Comments       Exercises       Shoulder Instructions      Home Living Family/patient expects to be discharged to:: Private residence Living Arrangements: Spouse/significant other Available Help at Discharge: Family Type of Home: House Home Access: Stairs to enter [employer] of Steps: 1 (small stoop)   Home Layout: One level               Home Equipment: Walker - 4 wheels;Cane - single point (3 wheeled rollator as well)          Prior Functioning/Environment Level of Independence: Independent with assistive device(s)        Comments: Used 3 wheeled rollator or 4 wheeled rollator.    OT Diagnosis: Altered mental status   OT Problem List: Decreased knowledge of precautions;Decreased knowledge of use of DME or AE;Decreased cognition;Impaired balance (sitting and/or standing);Decreased  strength   OT Treatment/Interventions: Self-care/ADL training;DME and/or AE instruction;Therapeutic activities;Cognitive remediation/compensation;Patient/family education;Balance training    OT Goals(Current goals can be found in the care plan section) Acute Rehab OT Goals Patient Stated Goal: not stated OT Goal Formulation: With family Time For Goal Achievement: 02/09/15 Potential to Achieve Goals: Good ADL Goals Pt Will Perform Lower Body Bathing:  with set-up;with supervision;with caregiver independent in assisting;sit to/from stand Pt Will Perform Lower Body Dressing: with set-up;with supervision;with caregiver independent in assisting;sit to/from stand Pt Will Transfer to Toilet: with supervision;ambulating Pt Will Perform Toileting - Clothing Manipulation and hygiene: sit to/from stand;with supervision;with set-up  OT Frequency: Min 2X/week   Barriers to D/C:            Co-evaluation              End of Session Equipment Utilized During Treatment: Rolling walker;Gait belt Nurse Communication: Other (comment) (scrotum is red-cream applied; pt in chair)  Activity Tolerance: Patient tolerated treatment well Patient left: in chair;with nursing/sitter in room;with family/visitor present   Time: [id number] OT Time Calculation (min): 20 min Charges:  OT General Charges $OT Visit: 1 Procedure OT Evaluation $Initial OT Evaluation Tier I: 1 Procedure G-CodesBenito Mccreedy OTR/L [id number] 02/02/2015, 3:59 PM

## 2015-02-02 NOTE — Progress Notes (Signed)
Pt is confuse very unstable in bed, keep pulling off his iv line and iv meds off, BP and PULSE running high,  PA on call was notified about pt condition all orders has been carried out, will continue to monitor him and the vital signs

## 2015-02-02 NOTE — Procedures (Signed)
ELECTROENCEPHALOGRAM REPORT  Date of Study: 02/02/2015  Patient's Name: [name] MRN: [id number] Date of Birth: 05-21-1933  Referring Provider: Dr. Berle Mull  Clinical History: This is a 81 year old man with increasing confusion, agitated and combative.  Medications: acetaminophen (TYLENOL) tablet 650 mg amLODipine (NORVASC) tablet 5 mg atenolol (TENORMIN) tablet 25 mg calcium carbonate (OS-CAL - dosed in mg of elemental calcium) tablet 1,250 mg cholecalciferol (VITAMIN D) tablet 1,000 Units ezetimibe (ZETIA) tablet 5 mg HYDROcodone-acetaminophen (NORCO/VICODIN) 5-325 MG per tablet 1 tablet levothyroxine (SYNTHROID, LEVOTHROID) tablet 150 mcg nitroGLYCERIN (NITROSTAT) SL tablet 0.4 mg omega-3 acid ethyl esters (LOVAZA) capsule 1 g pantoprazole (PROTONIX) EC tablet 40 mg piperacillin-tazobactam (ZOSYN) IVPB 3.375 g pravastatin (PRAVACHOL) tablet 40 mg predniSONE (DELTASONE) tablet 10 mg vancomycin (VANCOCIN) IVPB 750 mg/150 ml premix  Technical Summary: A multichannel digital EEG recording measured by the international 10-20 system with electrodes applied with paste and impedances below 5000 ohms performed in our laboratory with EKG monitoring in an awake and asleep patient.  Hyperventilation and photic stimulation were not performed.  The digital EEG was referentially recorded, reformatted, and digitally filtered in a variety of bipolar and referential montages for optimal display.    Description: The patient is awake and asleep during the recording.  He is noted to be confused. During maximal wakefulness, there poorly sustained 8 Hz posterior dominant rhythm that poorly attenuates to eye opening and eye closure. This is admixed with a small amount of diffuse 4-7 Hz theta and 2-3 Hz delta slowing of the waking background. There is a single left frontotemporal sharp wave seen. During drowsiness and sleep, there is an increase in theta and delta slowing of the background.   Poorly formed vertex waves and sleep spindles were seen.  Hyperventilation and photic stimulation were not performed.  There were no electrographic seizures seen.    EKG lead showed tachycardia up to 120 bpm with irregular rhythm.   Impression: This awake and asleep EEG is abnormal due to the presence of: 1. Mild to moderate diffuse slowing of the waking background 2. Single left frontotemporal sharp wave  Clinical Correlation of the above findings indicates diffuse cerebral dysfunction that is non-specific in etiology, and may be seen with hypoxic/ischemic injury, toxic/metabolic encephalopathy, or medication effect. There was a single sharp wave seen over the left frontotemporal region which indicates a possibility for seizures to arise from this region. There were no electrographic seizures seen in this study.     Ellouise Newer, M.D.

## 2015-02-02 NOTE — Progress Notes (Signed)
EEG completed; results pending.    

## 2015-02-02 NOTE — NC FL2 (Signed)
[place] LEVEL OF CARE SCREENING TOOL     IDENTIFICATION  Patient Name: [name] Birthdate: November 26, 1933 Sex: male Admission Date (Current Location): 01/31/2015  [facility] and [place] Number: Herbalist and Address:  [facility] [place] [place] [address] [place], [place] [address]      Provider Number: [id number]  Attending Physician Name and Address:  Reyne Dumas, MD  Relative Name and Phone Number:  Reine Just, Wife    Current Level of Care: Hospital Recommended Level of Care: [place] Prior Approval Number:    Date Approved/Denied:   PASRR Number: [id number]  Discharge Plan: SNF    Current Diagnoses: Patient Active Problem List   Diagnosis Date Noted  . Sepsis ([place]) 01/31/2015  . Altered mental status 01/31/2015  . Essential hypertension 01/31/2015  . Constipation 01/31/2015  . Hypothyroidism 01/31/2015  . Acute encephalopathy 01/31/2015  . Bladder outlet obstruction 01/31/2015  . GERD (gastroesophageal reflux disease) 01/31/2015  . Chronic back pain 01/31/2015  . Sigmoid diverticulitis 01/31/2015  . Acute kidney injury ([place]) 01/31/2015    Orientation ACTIVITIES/SOCIAL BLADDER RESPIRATION    Self  Family supportive Continent Normal  BEHAVIORAL SYMPTOMS/MOOD NEUROLOGICAL BOWEL NUTRITION STATUS      Incontinent  (See DC Summary)  PHYSICIAN VISITS COMMUNICATION OF NEEDS Height & Weight Skin    Verbally   204 lbs. Normal          AMBULATORY STATUS RESPIRATION    Assist extensive Normal      Personal Care Assistance Level of Assistance  Bathing, Feeding, Dressing Bathing Assistance: Maximum assistance Feeding assistance: Limited assistance Dressing Assistance: Limited assistance      Functional Limitations Info                [place]  PT (By licensed PT)     PT Frequency: 5x/week             Additional Factors Info  Code Status, Allergies Code  Status Info: Full Allergies Info: Demerol           Current Medications (02/02/2015): Current Facility-Administered Medications  Medication Dose Route Frequency Provider Last Rate Last Dose  . 0.9 % NaCl with KCl 40 mEq / L  infusion   Intravenous Continuous Reyne Dumas, MD 100 mL/hr at 02/02/15 0950 100 mL/hr at 02/02/15 0950  . acetaminophen (TYLENOL) tablet 650 mg  650 mg Oral Q6H PRN Nishant Dhungel, MD       Or  . acetaminophen (TYLENOL) suppository 650 mg  650 mg Rectal Q6H PRN Nishant Dhungel, MD      . amLODipine (NORVASC) tablet 5 mg  5 mg Oral Daily Nishant Dhungel, MD   5 mg at 02/02/15 0952  . atenolol (TENORMIN) tablet 25 mg  25 mg Oral Daily Nishant Dhungel, MD   25 mg at 02/02/15 0953  . calcium carbonate (OS-CAL - dosed in mg of elemental calcium) tablet 1,250 mg  1,250 mg Oral BID Nishant Dhungel, MD   1,250 mg at 02/02/15 0952  . cholecalciferol (VITAMIN D) tablet 1,000 Units  1,000 Units Oral QHS Nishant Dhungel, MD   1,000 Units at 02/01/15 2245  . ciprofloxacin (CIPRO) tablet 500 mg  500 mg Oral BID Reyne Dumas, MD   500 mg at 02/02/15 1429  . docusate sodium (COLACE) capsule 200 mg  200 mg Oral BID Nishant Dhungel, MD   200 mg at 02/02/15 0952  . ezetimibe (ZETIA) tablet 5 mg  5 mg Oral QHS Nishant Dhungel, MD   5 mg at 02/01/15 2244  . heparin injection 5,000 Units  5,000 Units Subcutaneous 3 times per day Nishant Dhungel, MD   5,000 Units at 02/02/15 [id number]  . hydrALAZINE (APRESOLINE) injection 10 mg  10 mg Intravenous Q6H PRN Nishant Dhungel, MD      . HYDROcodone-acetaminophen (NORCO/VICODIN) 5-325 MG per tablet 1 tablet  1 tablet Oral Q6H PRN Lavina Hamman, MD   1 tablet at 02/01/15 1141  . levothyroxine (SYNTHROID, LEVOTHROID) tablet 150 mcg  150 mcg Oral QAC breakfast Nishant Dhungel, MD   150 mcg at 02/02/15 0838  . metroNIDAZOLE (FLAGYL) tablet 500 mg  500 mg Oral 3 times per day Reyne Dumas, MD   500 mg at 02/02/15 1429  . multivitamin with minerals tablet  1 tablet  1 tablet Oral QHS Nishant Dhungel, MD   1 tablet at 02/01/15 2244  . MUSCLE RUB CREA   Topical PRN Assunta Found Stone, RPH      . nitroGLYCERIN (NITROSTAT) SL tablet 0.4 mg  0.4 mg Sublingual Q5 min PRN Nishant Dhungel, MD      . omega-3 acid ethyl esters (LOVAZA) capsule 1 g  1 g Oral Daily Nishant Dhungel, MD   1 g at 02/02/15 0952  . ondansetron (ZOFRAN) tablet 4 mg  4 mg Oral Q6H PRN Nishant Dhungel, MD       Or  . ondansetron (ZOFRAN) injection 4 mg  4 mg Intravenous Q6H PRN Nishant Dhungel, MD      . pantoprazole (PROTONIX) EC tablet 40 mg  40 mg Oral Daily Nishant Dhungel, MD   40 mg at 02/02/15 0953  . polyethylene glycol powder (GLYCOLAX/MIRALAX) container 17 g  17 g Oral Daily Nishant Dhungel, MD   17 g at 01/31/15 2252  . pravastatin (PRAVACHOL) tablet 40 mg  40 mg Oral Daily Nishant Dhungel, MD   40 mg at 02/02/15 0952  . predniSONE (DELTASONE) tablet 10 mg  10 mg Oral Q breakfast Nishant Dhungel, MD   10 mg at 02/02/15 0838  . senna (SENOKOT) tablet 17.2 mg  2 tablet Oral q morning - 10a Assunta Found Stone, RPH   17.2 mg at 02/02/15 [id number]   And  . senna (SENOKOT) tablet 8.6 mg  1 tablet Oral QHS Assunta Found Stone, RPH   8.6 mg at 02/01/15 2245  . sodium chloride 0.9 % injection 3 mL  3 mL Intravenous Q12H Nishant Dhungel, MD   3 mL at 02/01/15 2248   Do not use this list as official medication orders. Please verify with discharge summary.  Discharge Medications:   Medication List    ASK your doctor about these medications        acetaminophen 500 MG tablet  Commonly known as:  TYLENOL  Take 500 mg by mouth every 6 (six) hours as needed for mild pain.     alendronate 70 MG tablet  Commonly known as:  FOSAMAX  Take 70 mg by mouth Once a week. Sundays     amLODipine 5 MG tablet  Commonly known as:  NORVASC  Take 5 mg by mouth Daily.     aspirin EC 81 MG tablet  Take 81 mg by mouth at bedtime.     atenolol 50 MG tablet  Commonly known as:  TENORMIN  Take 25 mg by mouth  Daily.     calcium carbonate 600 MG Tabs tablet  Commonly known as:  OS-CAL  Take 600  mg by mouth 2 (two) times daily.     cholecalciferol 1000 UNITS tablet  Commonly known as:  VITAMIN D  Take 1,000 Units by mouth at bedtime.     Co Q 10 100 MG Caps  Take 100 mg by mouth at bedtime.     diclofenac 50 MG EC tablet  Commonly known as:  VOLTAREN  Take 50 mg by mouth Twice daily.     docusate sodium 100 MG capsule  Commonly known as:  COLACE  Take 200 mg by mouth 2 (two) times daily.     Fish Oil 1200 MG Caps  Take 1,200 mg by mouth 2 (two) times daily.     gabapentin 300 MG capsule  Commonly known as:  NEURONTIN  Take 600 mg by mouth every 12 (twelve) hours.     hydrochlorothiazide 25 MG tablet  Commonly known as:  HYDRODIURIL  Take 25 mg by mouth Daily.     HYDROcodone-acetaminophen 10-325 MG tablet  Commonly known as:  NORCO  Take 1 tablet by mouth every 4 (four) hours as needed for moderate pain.     levothyroxine 150 MCG tablet  Commonly known as:  SYNTHROID, LEVOTHROID  Take 150 mcg by mouth daily before breakfast.     losartan 100 MG tablet  Commonly known as:  COZAAR  Take 100 mg by mouth Daily.     multivitamin with minerals Tabs tablet  Take 1 tablet by mouth at bedtime.     nitroGLYCERIN 0.4 MG SL tablet  Commonly known as:  NITROSTAT  Place 0.4 mg under the tongue every 5 (five) minutes as needed for chest pain.     omeprazole 20 MG capsule  Commonly known as:  PRILOSEC  Take 20 mg by mouth Daily.     oxymetazoline 0.05 % nasal spray  Commonly known as:  AFRIN  Place 1 spray into both nostrils 2 (two) times daily.     polyethylene glycol powder powder  Commonly known as:  GLYCOLAX/MIRALAX  Take 17 g by mouth Once daily as needed. For constipation     pravastatin 40 MG tablet  Commonly known as:  PRAVACHOL  Take 40 mg by mouth daily.     predniSONE 10 MG tablet  Commonly known as:  DELTASONE  Take 10 mg by mouth Daily.     senna 8.6 MG  tablet  Commonly known as:  SENOKOT  Take 1-2 tablets by mouth 2 (two) times daily. 2 tabs in the morning and 1 tab at bedtime     trolamine salicylate 10 % cream  Commonly known as:  ASPERCREME  Apply 1 application topically as needed for muscle pain.     ZETIA 10 MG tablet  Generic drug:  ezetimibe  Take 5 mg by mouth at bedtime.        Relevant Imaging Results:  Relevant Lab Results:  Recent Labs    Additional [place], LCSW

## 2015-02-02 NOTE — Progress Notes (Signed)
Pt confused, restless, and continuously trying to climb out of bed and picking at IV lines. Wife asleep at bedside. Paged oncall NP. Awaiting callback or orders to be placed. Will continue to monitor.

## 2015-02-02 NOTE — Progress Notes (Signed)
Physical Therapy Treatment Patient Details Name: RITIK WHITEHORN MRN: [id number] DOB: 02/19/1934 Today's Date: 02/02/2015    History of Present Illness Pt adm with sepsis and AMS.     PT Comments    Patient still at min A to S level for mobility, still confused and reaching for items in the air, but not agitated.  However, caregiver seems unable to care for pt at home safely due to multiple concerns noted below so recommend SNF level rehab at d/c.  Will continue acute level PT till d/c.  Follow Up Recommendations  SNF     Equipment Recommendations  None recommended by PT    Recommendations for Other Services       Precautions / Restrictions Precautions Precautions: Fall    Mobility  Bed Mobility Overal bed mobility: Needs Assistance Bed Mobility: Supine to Sit     Supine to sit: Min guard;HOB elevated Sit to supine: Min assist   General bed mobility comments: cues and use of rail for pulling himself up tp sit, cues to scoot to edge of bed, sit to supine assist guiding legs in bed, then cues for scooting over in bed and assist for shoulders  Transfers Overall transfer level: Needs assistance Equipment used: Rolling walker (2 wheeled)   Sit to Stand: Min assist         General transfer comment: assist for balance initially standing from bed  Ambulation/Gait Ambulation/Gait assistance: Min guard;Supervision Ambulation Distance (Feet): 120 Feet   Gait Pattern/deviations: Step-through pattern;Trunk flexed;Decreased stride length;Steppage;Decreased dorsiflexion - right     General Gait Details: slow and effortful, coughing and with runny nose, pt fatigued with ambulation   Stairs            Wheelchair Mobility    Modified Rankin (Stroke Patients Only)       Balance Overall balance assessment: Needs assistance   Sitting balance-Leahy Scale: Fair       Standing balance-Leahy Scale: Poor Standing balance comment: A for balance with UE support                     Cognition Arousal/Alertness: Awake/alert Behavior During Therapy: Flat affect Overall Cognitive Status: Impaired/Different from baseline Area of Impairment: Attention;Following commands;Problem solving   Current Attention Level: Selective Memory: Decreased short-term memory Following Commands: Follows multi-step commands with increased time;Follows multi-step commands inconsistently     Problem Solving: Slow processing;Requires verbal cues      Exercises      General Comments General comments (skin integrity, edema, etc.): wife in room and speaking to [facility] representative; discussed about level of assist for mobility and wife with several questions/concerns about being able to care for pt at home including medications, mobility, her ability to rest at night, etc.      Pertinent Vitals/Pain Pain Assessment: No/denies pain    Home Living                      Prior Function            PT Goals (current goals can now be found in the care plan section) Progress towards PT goals: Progressing toward goals    Frequency  Min 2X/week    PT Plan Discharge plan needs to be updated;Frequency needs to be updated    Co-evaluation             End of Session Equipment Utilized During Treatment: Gait belt Activity Tolerance: Patient limited by fatigue Patient left: in  bed;with call bell/phone within reach;with family/visitor present;with nursing/sitter in room     Time: 1425-1510 PT Time Calculation (min) (ACUTE ONLY): 45 min  Charges:  $Gait Training: 23-37 mins $Self Care/Home Management: 8-22                    G Codes:      Tasnim Balentine,CYNDI February 06, 2015, 3:26 PM  Magda Kiel, [place] 02-06-15

## 2015-02-03 ENCOUNTER — Inpatient Hospital Stay (HOSPITAL_COMMUNITY): Payer: Medicare Other

## 2015-02-03 DIAGNOSIS — K7689 Other specified diseases of liver: Secondary | ICD-10-CM

## 2015-02-03 LAB — COMPREHENSIVE METABOLIC PANEL
ALT: 68 U/L — ABNORMAL HIGH (ref 17–63)
AST: 168 U/L — ABNORMAL HIGH (ref 15–41)
Albumin: 3.2 g/dL — ABNORMAL LOW (ref 3.5–5.0)
Alkaline Phosphatase: 62 U/L (ref 38–126)
Anion gap: 11 (ref 5–15)
BUN: 12 mg/dL (ref 6–20)
CO2: 20 mmol/L — ABNORMAL LOW (ref 22–32)
Calcium: 8.5 mg/dL — ABNORMAL LOW (ref 8.9–10.3)
Chloride: 107 mmol/L (ref 101–111)
Creatinine, Ser: 1.14 mg/dL (ref 0.61–1.24)
GFR calc Af Amer: >60 mL/min (ref >60)
GFR calc non Af Amer: 59 mL/min — ABNORMAL LOW (ref >60)
Glucose, Bld: 86 mg/dL (ref 65–99)
Potassium: 3.3 mmol/L — ABNORMAL LOW (ref 3.5–5.1)
Sodium: 138 mmol/L (ref 135–145)
Total Bilirubin: 1.1 mg/dL (ref 0.3–1.2)
Total Protein: 5.6 g/dL — ABNORMAL LOW (ref 6.5–8.1)

## 2015-02-03 LAB — CBC
HCT: 36.9 % — ABNORMAL LOW (ref 39.0–52.0)
Hemoglobin: 12.3 g/dL — ABNORMAL LOW (ref 13.0–17.0)
MCH: 32.1 pg (ref 26.0–34.0)
MCHC: 33.3 g/dL (ref 30.0–36.0)
MCV: 96.3 fL (ref 78.0–100.0)
Platelets: 187 10*3/uL (ref 150–400)
RBC: 3.83 MIL/uL — ABNORMAL LOW (ref 4.22–5.81)
RDW: 14.3 % (ref 11.5–15.5)
WBC: 10.3 10*3/uL (ref 4.0–10.5)

## 2015-02-03 LAB — ACETAMINOPHEN LEVEL: Acetaminophen (Tylenol), Serum: <10 ug/mL — ABNORMAL LOW (ref 10–30)

## 2015-02-03 MED ORDER — POTASSIUM CHLORIDE CRYS ER 20 MEQ PO TBCR
40.0000 meq | EXTENDED_RELEASE_TABLET | Freq: Once | ORAL | Status: AC
  Administered 2015-02-03: 40 meq via ORAL
  Filled 2015-02-03: qty 2

## 2015-02-03 MED ORDER — HYDROMORPHONE HCL 2 MG PO TABS: 2.0000 mg | ORAL_TABLET | Freq: Four times a day (QID) | ORAL | Status: DC | PRN

## 2015-02-03 MED ORDER — HYDROMORPHONE HCL 2 MG PO TABS
2.0000 mg | ORAL_TABLET | Freq: Four times a day (QID) | ORAL | Status: DC
  Administered 2015-02-03 – 2015-02-04 (×4): 2 mg via ORAL
  Filled 2015-02-03 (×4): qty 1

## 2015-02-03 MED ORDER — HYDROCODONE-ACETAMINOPHEN 5-325 MG PO TABS: 1.0000 | ORAL_TABLET | ORAL | Status: DC | PRN

## 2015-02-03 NOTE — Progress Notes (Signed)
Patient wife request all 4 side rail to be up on the bed. Explain the wife policy and its considered a restrinant and she continued to say she felt more confortable with them up

## 2015-02-03 NOTE — Progress Notes (Addendum)
[facility] Progress Note    Patient: [name]     GYI:[id number]  DOB: 1933/11/25     DOA: 01/31/2015 Date of Service: the patient was seen and examined on 02/03/2015 Day 3 of admission.  Subjective: Patient is more awake and alert, conversive, did not receive any Vicodin for 36 hours now, patient states that he takes 6 tablets of Vicodin on a regular basis at home  Assessment and Plan: 1. Sepsis [facility])  Patient met criteria with fever and tachycardia lactic acidosis and leukocytosis with acute kidney injury upon admission, now improving. Initially placed on vancomycin and Zosyn for suspected sepsis associated with  acute sigmoid diverticulitis. Now tolerating PO ciprofloxacin/Flagyl , day #2 CT of the abdomen was positive for mild sigmoid diverticulitis although the patient does not have any tenderness on examination. Patient having diarrhea, likely secondary to aggressive constipation regimen that the patient was initiated on because of constipation on CT scan  Chest x-ray is negative. Urine is also clear.  Continue gentle IV hydration  2. Acute encephalopathy. Resolved  Patient's wife reports baseline intermittent delirium and memory issues for the last 2 years which has never been evaluated by PCP or neurology CT of the head is negative. MRI brain negative as well. TSH free T4 ABG also unremarkable. Ammonia level is not significantly elevated. EEG  consistent with toxic/metabolic encephalopathy Withdrawal from opioids versus delirium, patient takes Vicodin at home. This has been switched to by mouth Dilaudid scheduled for family and patient request RN has been advised to hold scheduled Dilaudid if the patient appears to be sedated and somnolent  Physical therapy recommends SNF Patient needs outpatient neurology evaluation for Parkinson's/dementia  3. Urinary retention. Foley catheter was inserted in the ED for urinary retention. Will need to attempt voiding trial once  mentation improves Attempted DC Foley today once the patient starts ambulating   4. Essential hypertension. Continuing home blood pressure medications.  5 constipation. Discontinue stool softener. Laxative as the patient is now having diarrhea  6. Hypokalemia replete  7.transaminitis, check Tylenol level, right upper quadrant ultrasound, CT scan showed small gallstones but no evidence of cholecystitis. Patient denies any right upper quadrant pain  8.right fourth toe discoloration, onychomycosis, arterial duplex ordered to rule out compromised blood flow to the right leg  DVT Prophylaxis: subcutaneous Heparin Nutrition: Regular diet  Advance goals of care discussion: Full code  Brief Summary of Hospitalization:  HPI: Patient doesn't with complaints of back pain ongoing for 3 days as well as abdominal bloating. Also there was confusion as well as increasing urination. Daily update: MRI and blood work are negative on 02/01/2015. Consultants: None Procedures: None Antibiotics: Vancomycin and Zosyn 01/31/2015 start date  Family Communication: family was present at bedside, opportunity was given to ask question and all questions were answered satisfactorily at the time of interview.  Disposition:  Expected discharge date: 02/03/2015 Barriers to safe discharge: Improvement in mentation   Intake/Output Summary (Last 24 hours) at 02/03/15 1117 Last data filed at 02/03/15 0654  Gross per 24 hour  Intake   1823 ml  Output   2600 ml  Net   -777 ml   Filed Weights   01/31/15 1057  Weight: 92.534 kg (204 lb)    Objective: Physical Exam: Filed Vitals:   02/02/15 0644 02/02/15 1314 02/02/15 2239 02/03/15 0602  BP: 145/97 156/79 128/78 161/89  Pulse: 108 93 90 99  Temp:  98.8 F (37.1 C) 98.7 F (37.1 C) 97.8 F (36.6 C)  TempSrc:  Oral  Oral  Resp:  _0 Height:      Weight:      SpO2:  97% 98% 98%     General: Appear in mild distress, no Rash; Oral Mucosa  moist. Cardiovascular: S1 and S2 Present, no Murmur, no JVD Respiratory: Bilateral Air entry present and Clear to Auscultation, no Crackles, no wheezes Abdomen: Bowel Sound present, Soft and no tenderness Extremities: no Pedal edema, no calf tenderness Neurology: Right-sided foot drop and right ptosis  Data Reviewed: CBC:  Recent Labs Lab 01/31/15 1114 02/01/15 0014 02/01/15 0526 02/02/15 0509 02/03/15 0315  WBC 12.6*  --  12.4* 11.4* 10.3  NEUTROABS 9.1*  --   --  8.5*  --   HGB 14.4 12.9* 12.5* 12.2* 12.3*  HCT 44.9 38.8* 37.9* 35.7* 36.9*  MCV 99.1  --  97.9 95.7 96.3  PLT 243  --  185 191 825   Basic Metabolic Panel:  Recent Labs Lab 01/31/15 1114 02/01/15 0526 02/02/15 0509 02/03/15 0315  NA 139 138 140 138  K 3.9 3.4* 3.0* 3.3*  CL 100* 104 107 107  CO2 28 21* 23 20*  GLUCOSE 116* 94 98 86  BUN 21* _1 CREATININE 1.28* 1.16 1.31* 1.14  CALCIUM 9.7 8.6* 8.7* 8.5*  MG  --   --  1.7  --    Liver Function Tests:  Recent Labs Lab 01/31/15 1114 02/03/15 0315  AST 52* 168*  ALT 40 68*  ALKPHOS 65 62  BILITOT 1.2 1.1  PROT 7.3 5.6*  ALBUMIN 4.4 3.2*   No results for input(s): LIPASE, AMYLASE in the last 168 hours.  Recent Labs Lab 01/31/15 1906 02/01/15 1446  AMMONIA 19 39*    Cardiac Enzymes: No results for input(s): CKTOTAL, CKMB, CKMBINDEX, TROPONINI in the last 168 hours. BNP (last 3 results) No results for input(s): BNP in the last 8760 hours.  ProBNP (last 3 results) No results for input(s): PROBNP in the last 8760 hours.   CBG:  Recent Labs Lab 02/01/15 1137  GLUCAP 179*    Recent Results (from the past 240 hour(s))  Culture, blood (routine x 2)     Status: None (Preliminary result)   Collection Time: 01/31/15 11:52 AM  Result Value Ref Range Status   Specimen Description BLOOD RIGHT HAND  Final   Special Requests BOTTLES DRAWN AEROBIC AND ANAEROBIC 5CC  Final   Culture NO GROWTH 3 DAYS  Final   Report Status PENDING   Incomplete  Culture, blood (routine x 2)     Status: None (Preliminary result)   Collection Time: 01/31/15 11:53 AM  Result Value Ref Range Status   Specimen Description BLOOD RIGHT ANTECUBITAL  Final   Special Requests BOTTLES DRAWN AEROBIC AND ANAEROBIC 5ML  Final   Culture NO GROWTH 3 DAYS  Final   Report Status PENDING  Incomplete  Urine culture     Status: None   Collection Time: 01/31/15  3:42 PM  Result Value Ref Range Status   Specimen Description URINE, CATHETERIZED  Final   Special Requests NONE  Final   Culture NO GROWTH 1 DAY  Final   Report Status 02/01/2015 FINAL  Final     Studies: No results found.   Scheduled Meds: . amLODipine  5 mg Oral Daily  . aspirin EC  81 mg Oral QHS  . atenolol  25 mg Oral Daily  . calcium carbonate  1,250 mg Oral BID  . cholecalciferol  1,000 Units Oral QHS  . ciprofloxacin  500 mg Oral BID  . ezetimibe  5 mg Oral QHS  . heparin  5,000 Units Subcutaneous 3 times per day  . HYDROmorphone  2 mg Oral Q6H  . levothyroxine  150 mcg Oral QAC breakfast  . metroNIDAZOLE  500 mg Oral 3 times per day  . multivitamin with minerals  1 tablet Oral QHS  . omega-3 acid ethyl esters  1 g Oral Daily  . pantoprazole  40 mg Oral Daily  . potassium chloride  40 mEq Oral Once  . pravastatin  40 mg Oral Daily  . predniSONE  10 mg Oral Q breakfast  . sodium chloride  3 mL Intravenous Q12H   Continuous Infusions: . 0.9 % NaCl with KCl 40 mEq / L 75 mL/hr (02/03/15 1047)   Time spent: 35 minutes  Author: Berle Mull, MD [facility] Pager: [phone] 02/03/2015 11:17 AM  If 7PM-7AM, please contact night-coverage at [email or web address], password [facility]

## 2015-02-03 NOTE — Progress Notes (Signed)
MEDICATION RELATED CONSULT NOTE - FOLLOW UP   Pharmacy Consult for Opioid conversion  Indication: Pain management  Allergies  Allergen Reactions  . Demerol [Meperidine] Nausea And Vomiting    Patient Measurements: Height: 5' 11.5" (181.6 cm) Weight: 204 lb (92.534 kg) IBW/kg (Calculated) : 76.45   Vital Signs: Temp: 97.8 F (36.6 C) (11/24 0602) Temp Source: Oral (11/24 0602) BP: 161/89 mmHg (11/24 0602) Pulse Rate: 99 (11/24 0602) Intake/Output from previous day: 11/23 0701 - 11/24 0700 In: 2183 [P.O.:1080; I.V.:1103] Out: 2800 [Urine:2800] Intake/Output from this shift:    Labs:  Recent Labs  01/31/15 1114  02/01/15 0526 02/02/15 0509 02/03/15 0315  WBC 12.6*  --  12.4* 11.4* 10.3  HGB 14.4  < > 12.5* 12.2* 12.3*  HCT 44.9  < > 37.9* 35.7* 36.9*  PLT 243  --  185 191 187  CREATININE 1.28*  --  1.16 1.31* 1.14  MG  --   --   --  1.7  --   ALBUMIN 4.4  --   --   --  3.2*  PROT 7.3  --   --   --  5.6*  AST 52*  --   --   --  168*  ALT 40  --   --   --  68*  ALKPHOS 65  --   --   --  62  BILITOT 1.2  --   --   --  1.1  < > = values in this interval not displayed. Estimated Creatinine Clearance: 61.6 mL/min (by C-G formula based on Cr of 1.14).   Microbiology: Recent Results (from the past 720 hour(s))  Culture, blood (routine x 2)     Status: None (Preliminary result)   Collection Time: 01/31/15 11:52 AM  Result Value Ref Range Status   Specimen Description BLOOD RIGHT HAND  Final   Special Requests BOTTLES DRAWN AEROBIC AND ANAEROBIC 5CC  Final   Culture NO GROWTH 3 DAYS  Final   Report Status PENDING  Incomplete  Culture, blood (routine x 2)     Status: None (Preliminary result)   Collection Time: 01/31/15 11:53 AM  Result Value Ref Range Status   Specimen Description BLOOD RIGHT ANTECUBITAL  Final   Special Requests BOTTLES DRAWN AEROBIC AND ANAEROBIC 5ML  Final   Culture NO GROWTH 3 DAYS  Final   Report Status PENDING  Incomplete  Urine culture      Status: None   Collection Time: 01/31/15  3:42 PM  Result Value Ref Range Status   Specimen Description URINE, CATHETERIZED  Final   Special Requests NONE  Final   Culture NO GROWTH 1 DAY  Final   Report Status 02/01/2015 FINAL  Final    Medications:  Hydrocodone 5/325mg  1 tab Q4h PRN   Assessment: Pharmacy consulted to convert hydrocodone dosing to dilaudid. Conversion came out to 1.87mg  Q6h. Will dose at 2mg  Q6h to avoid tablet splitting. This is also the recommend starting dose for opioid naive patients.   Plan:  Dilaudid 2mg  PO Q6h PRN   Lehman Whiteley C. Lennox Grumbles, PharmD Pharmacy Resident  Pager: [phone] 02/03/2015 11:02 AM

## 2015-02-04 ENCOUNTER — Inpatient Hospital Stay (HOSPITAL_COMMUNITY): Payer: Medicare Other

## 2015-02-04 DIAGNOSIS — M79604 Pain in right leg: Secondary | ICD-10-CM

## 2015-02-04 DIAGNOSIS — M79605 Pain in left leg: Secondary | ICD-10-CM

## 2015-02-04 LAB — CBC
HCT: 39.1 % (ref 39.0–52.0)
Hemoglobin: 12.8 g/dL — ABNORMAL LOW (ref 13.0–17.0)
MCH: 32.7 pg (ref 26.0–34.0)
MCHC: 32.7 g/dL (ref 30.0–36.0)
MCV: 100 fL (ref 78.0–100.0)
Platelets: 228 10*3/uL (ref 150–400)
RBC: 3.91 MIL/uL — ABNORMAL LOW (ref 4.22–5.81)
RDW: 14.8 % (ref 11.5–15.5)
WBC: 11.6 10*3/uL — ABNORMAL HIGH (ref 4.0–10.5)

## 2015-02-04 LAB — COMPREHENSIVE METABOLIC PANEL
ALT: 73 U/L — ABNORMAL HIGH (ref 17–63)
AST: 146 U/L — ABNORMAL HIGH (ref 15–41)
Albumin: 3.6 g/dL (ref 3.5–5.0)
Alkaline Phosphatase: 61 U/L (ref 38–126)
Anion gap: 15 (ref 5–15)
BUN: 13 mg/dL (ref 6–20)
CO2: 16 mmol/L — ABNORMAL LOW (ref 22–32)
Calcium: 8.9 mg/dL (ref 8.9–10.3)
Chloride: 110 mmol/L (ref 101–111)
Creatinine, Ser: 1.19 mg/dL (ref 0.61–1.24)
GFR calc Af Amer: >60 mL/min (ref >60)
GFR calc non Af Amer: 56 mL/min — ABNORMAL LOW (ref >60)
Glucose, Bld: 83 mg/dL (ref 65–99)
Potassium: 4.4 mmol/L (ref 3.5–5.1)
Sodium: 141 mmol/L (ref 135–145)
Total Bilirubin: 1.7 mg/dL — ABNORMAL HIGH (ref 0.3–1.2)
Total Protein: 6.1 g/dL — ABNORMAL LOW (ref 6.5–8.1)

## 2015-02-04 LAB — BLOOD GAS, ARTERIAL
Acid-base deficit: 5.5 mmol/L — ABNORMAL HIGH (ref 0.0–2.0)
Bicarbonate: 18.1 mEq/L — ABNORMAL LOW (ref 20.0–24.0)
FIO2: 0.21
O2 Saturation: 94 %
Patient temperature: 98.6
TCO2: 18.9 mmol/L (ref 0–100)
pCO2 arterial: 27.8 mmHg — ABNORMAL LOW (ref 35.0–45.0)
pH, Arterial: 7.428 (ref 7.350–7.450)
pO2, Arterial: 72.1 mmHg — ABNORMAL LOW (ref 80.0–100.0)

## 2015-02-04 LAB — AMMONIA: Ammonia: 12 umol/L (ref 9–35)

## 2015-02-04 MED ORDER — CALCIUM CARBONATE 1250 (500 CA) MG PO TABS
1250.0000 mg | ORAL_TABLET | Freq: Two times a day (BID) | ORAL | Status: DC
  Administered 2015-02-04 (×2): 1250 mg via ORAL
  Filled 2015-02-04 (×3): qty 1

## 2015-02-04 MED ORDER — HYDROMORPHONE HCL 2 MG PO TABS: 2.0000 mg | ORAL_TABLET | Freq: Four times a day (QID) | ORAL | Status: DC | PRN

## 2015-02-04 MED ORDER — CALCIUM CARBONATE 1250 (500 CA) MG PO TABS: 500.0000 mg | ORAL_TABLET | Freq: Two times a day (BID) | ORAL | Status: DC

## 2015-02-04 MED ORDER — LORAZEPAM 0.5 MG PO TABS
0.5000 mg | ORAL_TABLET | Freq: Every day | ORAL | Status: DC
  Administered 2015-02-04: 0.5 mg via ORAL
  Filled 2015-02-04: qty 1

## 2015-02-04 MED ORDER — SODIUM CHLORIDE 0.9 % IV SOLN
INTRAVENOUS | Status: DC
  Administered 2015-02-04: 1000 mL via INTRAVENOUS
  Administered 2015-02-04: 23:00:00 via INTRAVENOUS

## 2015-02-04 MED ORDER — LORAZEPAM 2 MG/ML IJ SOLN
0.5000 mg | Freq: Once | INTRAMUSCULAR | Status: AC
  Administered 2015-02-04: 0.5 mg via INTRAVENOUS
  Filled 2015-02-04: qty 1

## 2015-02-04 NOTE — Progress Notes (Signed)
VASCULAR LAB PRELIMINARY  ARTERIAL  ABI completed: Normal ABIs at rest.     RIGHT    LEFT    PRESSURE WAVEFORM  PRESSURE WAVEFORM  BRACHIAL 167 Normal BRACHIAL 173 Normal  DP 180 Triphasic DP 197 Triphasic  PT 228 Triphasic PT 217 Triphasic  GREAT TOE  NA GREAT TOE  NA    RIGHT LEFT  ABI 1.3 1.2     Oluwatimileyin Vivier D, RVT 02/04/2015, 10:31 AM

## 2015-02-04 NOTE — Progress Notes (Addendum)
MD informed about patient intention to live after a discussion with psychiatry doctor. - documented on  wrong patient

## 2015-02-04 NOTE — Consult Note (Signed)
[facility] Consult   Reason for Consult:  Agitation and confusion Referring Physician: Dr. Murrell Converse Patient Identification: [name] MRN:  [id number] Principal Diagnosis: Sepsis [facility]) Diagnosis:   Patient Active Problem List   Diagnosis Date Noted  . Sepsis ([place]) [A41.9] 01/31/2015  . Altered mental status [R41.82] 01/31/2015  . Essential hypertension [I10] 01/31/2015  . Constipation [K59.00] 01/31/2015  . Hypothyroidism [E03.9] 01/31/2015  . Acute encephalopathy [G93.40] 01/31/2015  . Bladder outlet obstruction [N32.0] 01/31/2015  . GERD (gastroesophageal reflux disease) [K21.9] 01/31/2015  . Chronic back pain [M54.9, G89.29] 01/31/2015  . Sigmoid diverticulitis [K57.32] 01/31/2015  . Acute kidney injury ([place]) [N17.9] 01/31/2015    Total Time spent with patient: 45 minutes  Subjective:   [name] is a 81 y.o. male patient admitted with sepsis  HPI:  This patient is a 81 year old married white male who lives with his wife in [place]. He is a retired Software engineer. The patient was interviewed today with his son [name] present.  Apparently the patient had had increasing confusion at home for the past 2-3 days. Admission niece found to be septic probably secondary to sigmoid diverticulitis. When he was at home he was taking Percocet 10/325 6 times a day. Apparently he did not get this medication for the first few days of admission here. He seemed to be going through withdrawal with confusion twitching behavior etc. Yesterday he started on Dilaudid for pain and last night had a bad night again was increasingly confused and agitated and pulling out his IVs. Apparently his had a few small episodes of confusion over the last few months but it has been worse this past week.  pt has had EEG shows diffuse slowing and no new findings on brain MRI. He has no prior psychiatric history of psychiatric treatment and denies being depressed suicidal or having auditory  hallucinations. He has mentioned visual hallucinations such as seeing paperclips on the signs here. He is often disoriented and not sure where he is. He often says that he is at [facility] but then denies this. Apparently he has been increasingly paranoid particular he at night. His primary physician noted all of the symptoms in his converted the Dilaudid to PRN use only   He is very pleasant and conversant today but perseverates about his investments in [place]. He tends to repeat himself. He is oriented to self and situation but not sure exactly where he is. He denies auditory or visual hallucinations right now.  Past Psychiatric History: none  Risk to Self: Is patient at risk for suicide?: No Risk to Others:   Prior Inpatient Therapy:   Prior Outpatient Therapy:    Past Medical History:  Past Medical History  Diagnosis Date  . Hypothyroidism   . Vertigo     hx of  . Constipation     from medications taken  . GERD (gastroesophageal reflux disease)   . Arthritis   . Cancer ([place])     skin CA removed from back  . Hypercholesteremia   . Hypertension   . Bruises easily   . Complication of anesthesia     trouble waking up    Past Surgical History  Procedure Laterality Date  . Tonsillectomy    . Hernia repair    . Knee arthroplasty      right knee X 2; left knee once  . Laminectomy      X 6  . Back surgery    . Eye surgery  Bilateral Cataract surgery   . Cardiac catheterization      Stent X 1  . Coronary angioplasty    . Cervix surgery    . Posterior cervical fusion/foraminotomy  01/28/2012    Procedure: POSTERIOR CERVICAL FUSION/FORAMINOTOMY LEVEL 3;  Surgeon: [name], MD;  Location: [place] NEURO ORS;  Service: Neurosurgery;  Laterality: Left;  Posterior Cervical Five-Thoracic One Fusion, Arthrodesis with LEFT Cervical Seven-thoracic One Laminectomy, Foraminotomy and Resection of Synovial Cyst  . Posterior cervical fusion/foraminotomy N/A 01/29/2013     Procedure: POSTERIOR CERVICAL FUSION/FORAMINOTOMY LEVEL 1 and C2-5 Posteriolateral Arthrodesis;  Surgeon: [name], MD;  Location: [place] NEUR[facility] ORS;  Service: Neurosurgery;  Laterality: N/A;  C2-C3 Laminectomy C2-C3 posterior cervical arthrodesis   Family History: No family history on file. Family Psychiatric  History: none Social History:  History  Alcohol Use No    Comment: rare     History  Drug Use No    Social History   Social History  . Marital Status: Married    Spouse Name: N/A  . Number of Children: N/A  . Years of Education: N/A   Social History Main Topics  . Smoking status: Former Research scientist (life sciences)  . Smokeless tobacco: None  . Alcohol Use: No     Comment: rare  . Drug Use: No  . Sexual Activity: Not Asked   Other Topics Concern  . None   Social History Narrative   Additional Social History:                          Allergies:   Allergies  Allergen Reactions  . Demerol [Meperidine] Nausea And Vomiting    Labs:  Results for orders placed or performed during the hospital encounter of 01/31/15 (from the past 48 hour(s))  CBC     Status: Abnormal   Collection Time: 02/03/15  3:15 AM  Result Value Ref Range   WBC 10.3 4.0 - 10.5 K/uL   RBC 3.83 (L) 4.22 - 5.81 MIL/uL   Hemoglobin 12.3 (L) 13.0 - 17.0 g/dL   HCT 36.9 (L) 39.0 - 52.0 %   MCV 96.3 78.0 - 100.0 fL   MCH 32.1 26.0 - 34.0 pg   MCHC 33.3 30.0 - 36.0 g/dL   RDW 14.3 11.5 - 15.5 %   Platelets 187 150 - 400 K/uL  Comprehensive metabolic panel     Status: Abnormal   Collection Time: 02/03/15  3:15 AM  Result Value Ref Range   Sodium 138 135 - 145 mmol/L   Potassium 3.3 (L) 3.5 - 5.1 mmol/L   Chloride 107 101 - 111 mmol/L   CO2 20 (L) 22 - 32 mmol/L   Glucose, Bld 86 65 - 99 mg/dL   BUN 12 6 - 20 mg/dL   Creatinine, Ser 1.14 0.61 - 1.24 mg/dL   Calcium 8.5 (L) 8.9 - 10.3 mg/dL   Total Protein 5.6 (L) 6.5 - 8.1 g/dL   Albumin 3.2 (L) 3.5 - 5.0 g/dL   AST 168 (H) 15 - 41 U/L   ALT  68 (H) 17 - 63 U/L   Alkaline Phosphatase 62 38 - 126 U/L   Total Bilirubin 1.1 0.3 - 1.2 mg/dL   GFR calc non Af Amer 59 (L) >60 mL/min   GFR calc Af Amer >60 >60 mL/min    Comment: (NOTE) The eGFR has been calculated using the CKD EPI equation. This calculation has not been validated in all clinical situations.  eGFR's persistently <60 mL/min signify possible Chronic Kidney Disease.    Anion gap 11 5 - 15  Acetaminophen level     Status: Abnormal   Collection Time: 02/03/15 10:04 AM  Result Value Ref Range   Acetaminophen (Tylenol), Serum <10 (L) 10 - 30 ug/mL    Comment:        THERAPEUTIC CONCENTRATIONS VARY SIGNIFICANTLY. A RANGE OF 10-30 ug/mL MAY BE AN EFFECTIVE CONCENTRATION FOR MANY PATIENTS. HOWEVER, SOME ARE BEST TREATED AT CONCENTRATIONS OUTSIDE THIS RANGE. ACETAMINOPHEN CONCENTRATIONS >150 ug/mL AT 4 HOURS AFTER INGESTION AND >50 ug/mL AT 12 HOURS AFTER INGESTION ARE OFTEN ASSOCIATED WITH TOXIC REACTIONS.   Comprehensive metabolic panel     Status: Abnormal   Collection Time: 02/04/15  4:28 AM  Result Value Ref Range   Sodium 141 135 - 145 mmol/L   Potassium 4.4 3.5 - 5.1 mmol/L    Comment: DELTA CHECK NOTED   Chloride 110 101 - 111 mmol/L   CO2 16 (L) 22 - 32 mmol/L   Glucose, Bld 83 65 - 99 mg/dL   BUN 13 6 - 20 mg/dL   Creatinine, Ser 1.19 0.61 - 1.24 mg/dL   Calcium 8.9 8.9 - 10.3 mg/dL   Total Protein 6.1 (L) 6.5 - 8.1 g/dL   Albumin 3.6 3.5 - 5.0 g/dL   AST 146 (H) 15 - 41 U/L   ALT 73 (H) 17 - 63 U/L   Alkaline Phosphatase 61 38 - 126 U/L   Total Bilirubin 1.7 (H) 0.3 - 1.2 mg/dL   GFR calc non Af Amer 56 (L) >60 mL/min   GFR calc Af Amer >60 >60 mL/min    Comment: (NOTE) The eGFR has been calculated using the CKD EPI equation. This calculation has not been validated in all clinical situations. eGFR's persistently <60 mL/min signify possible Chronic Kidney Disease.    Anion gap 15 5 - 15  CBC     Status: Abnormal   Collection Time:  02/04/15  4:28 AM  Result Value Ref Range   WBC 11.6 (H) 4.0 - 10.5 K/uL    Comment: WHITE COUNT CONFIRMED ON SMEAR   RBC 3.91 (L) 4.22 - 5.81 MIL/uL   Hemoglobin 12.8 (L) 13.0 - 17.0 g/dL   HCT 39.1 39.0 - 52.0 %   MCV 100.0 78.0 - 100.0 fL   MCH 32.7 26.0 - 34.0 pg   MCHC 32.7 30.0 - 36.0 g/dL   RDW 14.8 11.5 - 15.5 %   Platelets 228 150 - 400 K/uL    Comment: PLATELET COUNT CONFIRMED BY SMEAR  Blood gas, arterial     Status: Abnormal   Collection Time: 02/04/15  8:30 AM  Result Value Ref Range   FIO2 0.21    Delivery systems ROOM AIR    pH, Arterial 7.428 7.350 - 7.450   pCO2 arterial 27.8 (L) 35.0 - 45.0 mmHg   pO2, Arterial 72.1 (L) 80.0 - 100.0 mmHg   Bicarbonate 18.1 (L) 20.0 - 24.0 mEq/L   TCO2 18.9 0 - 100 mmol/L   Acid-base deficit 5.5 (H) 0.0 - 2.0 mmol/L   O2 Saturation 94.0 %   Patient temperature 98.6    Collection site RIGHT RADIAL    Sample type ARTERIAL DRAW    Allens test (pass/fail) PASS PASS  Ammonia     Status: None   Collection Time: 02/04/15  8:41 AM  Result Value Ref Range   Ammonia 12 9 - 35 umol/L    Current Facility-Administered Medications  Medication Dose Route Frequency Provider Last Rate Last Dose  . 0.9 %  sodium chloride infusion   Intravenous Continuous Reyne Dumas, MD 75 mL/hr at 02/04/15 0902 1,000 mL at 02/04/15 0902  . acetaminophen (TYLENOL) tablet 650 mg  650 mg Oral Q6H PRN Nishant Dhungel, MD       Or  . acetaminophen (TYLENOL) suppository 650 mg  650 mg Rectal Q6H PRN Nishant Dhungel, MD      . amLODipine (NORVASC) tablet 5 mg  5 mg Oral Daily Nishant Dhungel, MD   5 mg at 02/04/15 0924  . aspirin EC tablet 81 mg  81 mg Oral QHS Reyne Dumas, MD   81 mg at 02/03/15 2209  . atenolol (TENORMIN) tablet 25 mg  25 mg Oral Daily Nishant Dhungel, MD   25 mg at 02/04/15 0924  . calcium carbonate (OS-CAL - dosed in mg of elemental calcium) tablet 1,250 mg  1,250 mg Oral BID Reyne Dumas, MD   1,250 mg at 02/04/15 0924  . cholecalciferol  (VITAMIN D) tablet 1,000 Units  1,000 Units Oral QHS Nishant Dhungel, MD   1,000 Units at 02/03/15 2209  . ciprofloxacin (CIPRO) tablet 500 mg  500 mg Oral BID Reyne Dumas, MD   500 mg at 02/04/15 0755  . ezetimibe (ZETIA) tablet 5 mg  5 mg Oral QHS Nishant Dhungel, MD   5 mg at 02/03/15 2208  . heparin injection 5,000 Units  5,000 Units Subcutaneous 3 times per day [name], MD   5,000 Units at 02/04/15 0528  . hydrALAZINE (APRESOLINE) injection 10 mg  10 mg Intravenous Q6H PRN Nishant Dhungel, MD      . HYDROmorphone (DILAUDID) tablet 2 mg  2 mg Oral Q6H PRN Reyne Dumas, MD      . levothyroxine (SYNTHROID, LEVOTHROID) tablet 150 mcg  150 mcg Oral QAC breakfast Nishant Dhungel, MD   150 mcg at 02/04/15 0755  . metroNIDAZOLE (FLAGYL) tablet 500 mg  500 mg Oral 3 times per day Reyne Dumas, MD   500 mg at 02/04/15 0528  . multivitamin with minerals tablet 1 tablet  1 tablet Oral QHS Nishant Dhungel, MD   1 tablet at 02/03/15 2209  . MUSCLE RUB CREA   Topical PRN Assunta Found Stone, RPH      . nitroGLYCERIN (NITROSTAT) SL tablet 0.4 mg  0.4 mg Sublingual Q5 min PRN Nishant Dhungel, MD      . omega-3 acid ethyl esters (LOVAZA) capsule 1 g  1 g Oral Daily Nishant Dhungel, MD   1 g at 02/04/15 0924  . ondansetron (ZOFRAN) tablet 4 mg  4 mg Oral Q6H PRN Nishant Dhungel, MD       Or  . ondansetron (ZOFRAN) injection 4 mg  4 mg Intravenous Q6H PRN Nishant Dhungel, MD      . pantoprazole (PROTONIX) EC tablet 40 mg  40 mg Oral Daily Nishant Dhungel, MD   40 mg at 02/04/15 0924  . pravastatin (PRAVACHOL) tablet 40 mg  40 mg Oral Daily Nishant Dhungel, MD   40 mg at 02/04/15 0924  . predniSONE (DELTASONE) tablet 10 mg  10 mg Oral Q breakfast Nishant Dhungel, MD   10 mg at 02/04/15 0755  . sodium chloride 0.9 % injection 3 mL  3 mL Intravenous Q12H Nishant Dhungel, MD   3 mL at 02/04/15 9323    Musculoskeletal: Strength & Muscle Tone: decreased Gait & Station: unsteady Patient leans:  N/A  Psychiatric Specialty Exam: Review of Systems  Gastrointestinal: Positive for abdominal pain.  Psychiatric/Behavioral: The patient is nervous/anxious.   All other systems reviewed and are negative.   Blood pressure 162/76, pulse 90, temperature 98.1 F (36.7 C), temperature source Oral, resp. rate 16, height 5' 11.5" (1.816 m), weight 92.534 kg (204 lb), SpO2 98 %.Body mass index is 28.06 kg/(m^2).  General Appearance: Casual and Fairly Groomed  Eye Contact::  Good  Speech:  Garbled  Volume:  Normal  Mood:  Anxious  Affect:  Congruent  Thought Process:  Circumstantial and Disorganized  Orientation:  Other:  Oriented to person and situation but doesn't understand what hospital he is in despite being told repeatedly  Thought Content:  Hallucinations: Visual and Paranoid Ideation  Suicidal Thoughts:  No  Homicidal Thoughts:  No  Memory:  Immediate;   Poor Recent;   Fair Remote;   Fair  Judgement:  Impaired  Insight:  Lacking  Psychomotor Activity:  Normal  Concentration:  Poor  Recall:  Poor  Fund of Knowledge:Good  Language: Good  Akathisia:  No  Handed:  Right  AIMS (if indicated):     Assets:  Communication Skills Desire for Improvement Resilience Social Support Vocational/Educational  ADL's:  Impaired  Cognition: WNL  Sleep:      Treatment Plan Summary: Daily contact with patient to assess and evaluate symptoms and progress in treatment and Medication management  Disposition: No evidence of imminent risk to self or others at present.   Patient does not meet criteria for psychiatric inpatient admission. Supportive therapy provided about ongoing stressors.   Patient discussed with attending physician. His confusion and agitation are probably due to multifactorial causes. He is gone through a recent infection, probable overuse of pain medication and recent withdrawal. I agree with making his narcotic pain medicine when necessary only for severe pain as it seems to  cause more confusion and agitation. Will start a standing order of Ativan at bedtime to help with his nighttime agitation. Patient will be followed to ascertain progress   Levern Pitter, [facility] 02/04/2015 2:05 PM

## 2015-02-04 NOTE — Progress Notes (Addendum)
[facility] Progress Note    Patient: [name]     QJJ:[id number]  DOB: 04/12/1933     DOA: 01/31/2015 Date of Service: the patient was seen and examined on 02/04/2015 Day 4 of admission.  Subjective: Pt was  Confused  last night , mittens placed, patient has received a total of 4 doses of Dilaudid since yesterday Patient is very paranoid, does not trust his wife or sons  Does not trust the nursing staff, or his hospitalist   Assessment and Plan: 1. Sepsis [facility])  Patient met criteria with fever and tachycardia lactic acidosis and leukocytosis with acute kidney injury upon admission, now improving. Initially placed on vancomycin and Zosyn for suspected sepsis associated with  acute sigmoid diverticulitis. Now tolerating PO ciprofloxacin/Flagyl , day #3 CT of the abdomen was positive for mild sigmoid diverticulitis although the patient does not have any tenderness on examination. Diarrhea resolved, likely secondary to aggressive constipation regimen that the patient was initiated on because of constipation on CT scan.  Chest x-ray is negative. Urine is also clear.     2. Acute encephalopathy. recurred after being started on Dilaudid last night  Patient's wife reports baseline intermittent delirium and memory issues for the last 2 years which has never been evaluated by PCP or neurology CT of the head is negative. MRI brain negative as well. TSH free T4 ABG also unremarkable. Ammonia level is not significantly elevated. EEG  consistent with toxic/metabolic encephalopathy Withdrawal from opioids versus acute delirium, yesterday be switched to Vicodin to by mouth Dilaudid based on patient request Based on patient and his wife's request patient received 4 doses of Dilaudid overnight Patient continued to get more and more confused overnight, delusional and paranoid RN has been advised to hold scheduled Dilaudid if the patient appears to be sedated and somnolent Hold off on  discharge to SNF today and reassess tomorrow Patient needs outpatient neurology evaluation for Parkinson's/dementia Will consult psyche for competency ,MX of  acute delerium    3. Urinary retention. Foley catheter was inserted in the ED for urinary retention. Will need to attempt voiding trial once mentation improves Attempted DC Foley today once the patient starts ambulating   4. Essential hypertension. Continuing home blood pressure medications.  5 constipation. Discontinue stool softener. Laxative as the patient is now having diarrhea  6. Hypokalemia replete  7.transaminitis, improving, Tylenol level less than 10, right upper quadrant ultrasound negative, CT scan showed small gallstones but no evidence of cholecystitis. Patient denies any right upper quadrant pain.   ammonia level improving without rx,   8.right fourth toe discoloration, onychomycosis, arterial duplex  Negative   DVT Prophylaxis: subcutaneous Heparin Nutrition: Regular diet  Advance goals of care discussion: Full code  Brief Summary of Hospitalization:  HPI: Patient doesn't with complaints of back pain ongoing for 3 days as well as abdominal bloating. Also there was confusion as well as increasing urination. Daily update: MRI and blood work are negative on 02/01/2015. Consultants: None Procedures: None Antibiotics: Vancomycin and Zosyn 01/31/2015 start date  Family Communication: family was present at bedside, opportunity was given to ask question and all questions were answered satisfactorily at the time of interview.  Disposition:  Expected discharge date: 02/03/2015 Barriers to safe discharge: Improvement in mentation   Intake/Output Summary (Last 24 hours) at 02/04/15 0842 Last data filed at 02/04/15 0757  Gross per 24 hour  Intake 2414.58 ml  Output    500 ml  Net 1914.58 ml   Danley Danker  Weights   01/31/15 1057  Weight: 92.534 kg (204 lb)    Objective: Physical Exam: Filed Vitals:    02/03/15 0602 02/03/15 1337 02/03/15 2110 02/04/15 0515  BP: 161/89 124/93 145/62 173/82  Pulse: 99 97 89 112  Temp: 97.8 F (36.6 C)  99 F (37.2 C) 98.4 F (36.9 C)  TempSrc: Oral  Oral Oral  Resp: $Remo'18 22 18 18  'AsVON$ Height:      Weight:      SpO2: 98% 98% 97% 97%     General: Appear in mild distress, no Rash; Oral Mucosa moist. Cardiovascular: S1 and S2 Present, no Murmur, no JVD Respiratory: Bilateral Air entry present and Clear to Auscultation, no Crackles, no wheezes Abdomen: Bowel Sound present, Soft and no tenderness Extremities: no Pedal edema, no calf tenderness Neurology: Right-sided foot drop and right ptosis  Data Reviewed: CBC:  Recent Labs Lab 01/31/15 1114 02/01/15 0014 02/01/15 0526 02/02/15 0509 02/03/15 0315 02/04/15 0428  WBC 12.6*  --  12.4* 11.4* 10.3 11.6*  NEUTROABS 9.1*  --   --  8.5*  --   --   HGB 14.4 12.9* 12.5* 12.2* 12.3* 12.8*  HCT 44.9 38.8* 37.9* 35.7* 36.9* 39.1  MCV 99.1  --  97.9 95.7 96.3 100.0  PLT 243  --  185 191 187 491   Basic Metabolic Panel:  Recent Labs Lab 01/31/15 1114 02/01/15 0526 02/02/15 0509 02/03/15 0315 02/04/15 0428  NA 139 138 140 138 141  K 3.9 3.4* 3.0* 3.3* 4.4  CL 100* 104 107 107 110  CO2 28 21* 23 20* 16*  GLUCOSE 116* 94 98 86 83  BUN 21* $Remov'15 13 12 13  'meShmT$ CREATININE 1.28* 1.16 1.31* 1.14 1.19  CALCIUM 9.7 8.6* 8.7* 8.5* 8.9  MG  --   --  1.7  --   --    Liver Function Tests:  Recent Labs Lab 01/31/15 1114 02/03/15 0315 02/04/15 0428  AST 52* 168* 146*  ALT 40 68* 73*  ALKPHOS 65 62 61  BILITOT 1.2 1.1 1.7*  PROT 7.3 5.6* 6.1*  ALBUMIN 4.4 3.2* 3.6   No results for input(s): LIPASE, AMYLASE in the last 168 hours.  Recent Labs Lab 01/31/15 1906 02/01/15 1446  AMMONIA 19 39*    Cardiac Enzymes: No results for input(s): CKTOTAL, CKMB, CKMBINDEX, TROPONINI in the last 168 hours. BNP (last 3 results) No results for input(s): BNP in the last 8760 hours.  ProBNP (last 3 results) No  results for input(s): PROBNP in the last 8760 hours.   CBG:  Recent Labs Lab 02/01/15 1137  GLUCAP 179*    Recent Results (from the past 240 hour(s))  Culture, blood (routine x 2)     Status: None (Preliminary result)   Collection Time: 01/31/15 11:52 AM  Result Value Ref Range Status   Specimen Description BLOOD RIGHT HAND  Final   Special Requests BOTTLES DRAWN AEROBIC AND ANAEROBIC 5CC  Final   Culture NO GROWTH 3 DAYS  Final   Report Status PENDING  Incomplete  Culture, blood (routine x 2)     Status: None (Preliminary result)   Collection Time: 01/31/15 11:53 AM  Result Value Ref Range Status   Specimen Description BLOOD RIGHT ANTECUBITAL  Final   Special Requests BOTTLES DRAWN AEROBIC AND ANAEROBIC 5ML  Final   Culture NO GROWTH 3 DAYS  Final   Report Status PENDING  Incomplete  Urine culture     Status: None   Collection Time: 01/31/15  3:42 PM  Result Value Ref Range Status   Specimen Description URINE, CATHETERIZED  Final   Special Requests NONE  Final   Culture NO GROWTH 1 DAY  Final   Report Status 02/01/2015 FINAL  Final     Studies: US Abdomen Limited Ruq  February 05, 2015  CLINICAL DATA:  Abnormal liver function studies. History of hypertension and obesity. EXAM: US ABDOMEN LIMITED - RIGHT UPPER QUADRANT COMPARISON:  Renal ultrasound 01/31/2015.  Abdominal CT 01/31/2015. FINDINGS: Gallbladder: As demonstrated on recent CT, there are several small gallstones. No gallbladder wall thickening, pericholecystic fluid or sonographic Murphy's sign Common bile duct: Diameter: 4 mm. Liver: No focal lesion identified. Within normal limits in parenchymal echogenicity. Other:  Right renal cysts are partially imaged. IMPRESSION: Cholelithiasis without evidence of cholecystitis or biliary dilatation. Electronically Signed   By: Richardean Sale M.D.   On: 2015/02/05 15:14     Scheduled Meds: . amLODipine  5 mg Oral Daily  . aspirin EC  81 mg Oral QHS  . atenolol  25 mg Oral Daily    . calcium carbonate  1,250 mg Oral BID  . cholecalciferol  1,000 Units Oral QHS  . ciprofloxacin  500 mg Oral BID  . ezetimibe  5 mg Oral QHS  . heparin  5,000 Units Subcutaneous 3 times per day  . levothyroxine  150 mcg Oral QAC breakfast  . metroNIDAZOLE  500 mg Oral 3 times per day  . multivitamin with minerals  1 tablet Oral QHS  . omega-3 acid ethyl esters  1 g Oral Daily  . pantoprazole  40 mg Oral Daily  . pravastatin  40 mg Oral Daily  . predniSONE  10 mg Oral Q breakfast  . sodium chloride  3 mL Intravenous Q12H   Continuous Infusions: . 0.9 % NaCl with KCl 40 mEq / L 75 mL/hr (02/04/15 0221)   Time spent: 35 minutes  Author: Berle Mull, MD [facility] Pager: [phone] 02/04/2015 8:42 AM  If 7PM-7AM, please contact night-coverage at [email or web address], password [facility]

## 2015-02-05 DIAGNOSIS — R945 Abnormal results of liver function studies: Secondary | ICD-10-CM | POA: Insufficient documentation

## 2015-02-05 LAB — CBC
HCT: 36.7 % — ABNORMAL LOW (ref 39.0–52.0)
Hemoglobin: 12.1 g/dL — ABNORMAL LOW (ref 13.0–17.0)
MCH: 31.8 pg (ref 26.0–34.0)
MCHC: 33 g/dL (ref 30.0–36.0)
MCV: 96.3 fL (ref 78.0–100.0)
Platelets: 252 10*3/uL (ref 150–400)
RBC: 3.81 MIL/uL — ABNORMAL LOW (ref 4.22–5.81)
RDW: 14.1 % (ref 11.5–15.5)
WBC: 9.8 10*3/uL (ref 4.0–10.5)

## 2015-02-05 LAB — CULTURE, BLOOD (ROUTINE X 2)
Culture: NO GROWTH
Culture: NO GROWTH

## 2015-02-05 LAB — COMPREHENSIVE METABOLIC PANEL
ALT: 74 U/L — ABNORMAL HIGH (ref 17–63)
AST: 115 U/L — ABNORMAL HIGH (ref 15–41)
Albumin: 3.3 g/dL — ABNORMAL LOW (ref 3.5–5.0)
Alkaline Phosphatase: 60 U/L (ref 38–126)
Anion gap: 11 (ref 5–15)
BUN: 10 mg/dL (ref 6–20)
CO2: 21 mmol/L — ABNORMAL LOW (ref 22–32)
Calcium: 8.5 mg/dL — ABNORMAL LOW (ref 8.9–10.3)
Chloride: 105 mmol/L (ref 101–111)
Creatinine, Ser: 1.12 mg/dL (ref 0.61–1.24)
GFR calc Af Amer: >60 mL/min (ref >60)
GFR calc non Af Amer: >60 mL/min (ref >60)
Glucose, Bld: 84 mg/dL (ref 65–99)
Potassium: 3.1 mmol/L — ABNORMAL LOW (ref 3.5–5.1)
Sodium: 137 mmol/L (ref 135–145)
Total Bilirubin: 1.1 mg/dL (ref 0.3–1.2)
Total Protein: 5.9 g/dL — ABNORMAL LOW (ref 6.5–8.1)

## 2015-02-05 MED ORDER — HYDROMORPHONE HCL 2 MG PO TABS: 2.0000 mg | ORAL_TABLET | Freq: Four times a day (QID) | ORAL | Status: DC | PRN

## 2015-02-05 MED ORDER — CIPROFLOXACIN HCL 500 MG PO TABS: 500.0000 mg | ORAL_TABLET | Freq: Two times a day (BID) | ORAL | Status: DC

## 2015-02-05 MED ORDER — LOSARTAN POTASSIUM 100 MG PO TABS: 100.0000 mg | ORAL_TABLET | Freq: Every day | ORAL | Status: DC

## 2015-02-05 MED ORDER — POTASSIUM CHLORIDE ER 10 MEQ PO TBCR: 10.0000 meq | EXTENDED_RELEASE_TABLET | Freq: Two times a day (BID) | ORAL | Status: DC

## 2015-02-05 MED ORDER — LORAZEPAM 0.5 MG PO TABS: 0.5000 mg | ORAL_TABLET | Freq: Every day | ORAL | Status: DC

## 2015-02-05 MED ORDER — METRONIDAZOLE 500 MG PO TABS: 500.0000 mg | ORAL_TABLET | Freq: Three times a day (TID) | ORAL | Status: DC

## 2015-02-05 NOTE — Discharge Summary (Addendum)
Physician Discharge Summary  [name] MRN: [id number] DOB/AGE: 1933/07/09 81 y.o.  PCP: Donnajean Lopes, MD   Admit date: 01/31/2015 Discharge date: 02/05/2015  Discharge Diagnoses:     Principal Problem:   Sepsis [facility]) Active Problems:   Altered mental status   Essential hypertension   Constipation   Hypothyroidism   Acute encephalopathy   Bladder outlet obstruction   GERD (gastroesophageal reflux disease)   Chronic back pain   Sigmoid diverticulitis   Acute kidney injury ([place])   Abnormal liver function  Acute delirium    Follow-up recommendations Follow-up with PCP in 3-5 days , including all  additional recommended appointments as below Follow-up CBC, CMP, magnesium  in 3-5 days Patient can resume his antihypertensive medications next  Week if his renal function remained stable Patient would need a full cognitive assessment for probable early dementia     Medication List    STOP taking these medications        acetaminophen 500 MG tablet  Commonly known as:  TYLENOL     gabapentin 300 MG capsule  Commonly known as:  NEURONTIN     hydrochlorothiazide 25 MG tablet  Commonly known as:  HYDRODIURIL     HYDROcodone-acetaminophen 10-325 MG tablet  Commonly known as:  NORCO      TAKE these medications        alendronate 70 MG tablet  Commonly known as:  FOSAMAX  Take 70 mg by mouth Once a week. Sundays     amLODipine 5 MG tablet  Commonly known as:  NORVASC  Take 5 mg by mouth Daily.     aspirin EC 81 MG tablet  Take 81 mg by mouth at bedtime.     atenolol 50 MG tablet  Commonly known as:  TENORMIN  Take 25 mg by mouth Daily.     calcium carbonate 600 MG Tabs tablet  Commonly known as:  OS-CAL  Take 600 mg by mouth 2 (two) times daily.     cholecalciferol 1000 UNITS tablet  Commonly known as:  VITAMIN D  Take 1,000 Units by mouth at bedtime.     ciprofloxacin 500 MG tablet  Commonly known as:  CIPRO  Take 1 tablet (500 mg total)  by mouth 2 (two) times daily.     Co Q 10 100 MG Caps  Take 100 mg by mouth at bedtime.     diclofenac 50 MG EC tablet  Commonly known as:  VOLTAREN  Take 50 mg by mouth Twice daily.     docusate sodium 100 MG capsule  Commonly known as:  COLACE  Take 200 mg by mouth 2 (two) times daily.     Fish Oil 1200 MG Caps  Take 1,200 mg by mouth 2 (two) times daily.     HYDROmorphone 2 MG tablet  Commonly known as:  DILAUDID  Take 1 tablet (2 mg total) by mouth every 6 (six) hours as needed for severe pain.     levothyroxine 150 MCG tablet  Commonly known as:  SYNTHROID, LEVOTHROID  Take 150 mcg by mouth daily before breakfast.     LORazepam 0.5 MG tablet  Commonly known as:  ATIVAN  Take 1 tablet (0.5 mg total) by mouth at bedtime.     losartan 100 MG tablet  Commonly known as:  COZAAR  Take 1 tablet (100 mg total) by mouth daily.  Start taking on:  02/08/2015     metroNIDAZOLE 500 MG tablet  Commonly known as:  FLAGYL  Take 1 tablet (500 mg total) by mouth every 8 (eight) hours.     multivitamin with minerals Tabs tablet  Take 1 tablet by mouth at bedtime.     nitroGLYCERIN 0.4 MG SL tablet  Commonly known as:  NITROSTAT  Place 0.4 mg under the tongue every 5 (five) minutes as needed for chest pain.     omeprazole 20 MG capsule  Commonly known as:  PRILOSEC  Take 20 mg by mouth Daily.     oxymetazoline 0.05 % nasal spray  Commonly known as:  AFRIN  Place 1 spray into both nostrils 2 (two) times daily.     polyethylene glycol powder powder  Commonly known as:  GLYCOLAX/MIRALAX  Take 17 g by mouth Once daily as needed. For constipation     pravastatin 40 MG tablet  Commonly known as:  PRAVACHOL  Take 40 mg by mouth daily.     predniSONE 10 MG tablet  Commonly known as:  DELTASONE  Take 10 mg by mouth Daily.     senna 8.6 MG tablet  Commonly known as:  SENOKOT  Take 1-2 tablets by mouth 2 (two) times daily. 2 tabs in the morning and 1 tab at bedtime      trolamine salicylate 10 % cream  Commonly known as:  ASPERCREME  Apply 1 application topically as needed for muscle pain.     ZETIA 10 MG tablet  Generic drug:  ezetimibe  Take 5 mg by mouth at bedtime.         Discharge Condition: Stable   Discharge Instructions     Allergies  Allergen Reactions  . Demerol [Meperidine] Nausea And Vomiting      Disposition: 01-Home or Self Care   Consults: * Psychiatry     Significant Diagnostic Studies:  Dg Chest 2 View  01/31/2015  CLINICAL DATA:  Chest pain, weakness for a few days. History of hypertension. EXAM: CHEST  2 VIEW COMPARISON:  01/18/2012 FINDINGS: Linear densities in the lung bases likely reflect scarring or atelectasis. Heart is normal size. Mild tortuosity of the thoracic aorta. No effusions. No acute bony abnormality. IMPRESSION: Bibasilar linear scarring or atelectasis. Electronically Signed   By: Rolm Baptise M.D.   On: 01/31/2015 13:33   Ct Head Wo Contrast  01/31/2015  CLINICAL DATA:  Confusion today.  Initial encounter. EXAM: CT HEAD WITHOUT CONTRAST TECHNIQUE: Contiguous axial images were obtained from the base of the skull through the vertex without intravenous contrast. COMPARISON:  None. FINDINGS: There is cortical atrophy noted and chronic microvascular ischemic change. No evidence of acute intracranial abnormality including hemorrhage, infarct, mass lesion, mass effect, midline shift or abnormal extra-axial fluid collection. No hydrocephalus or pneumocephalus. The calvarium is intact. IMPRESSION: No acute abnormality. Atrophy and chronic microvascular ischemic change. Electronically Signed   By: Inge Rise M.D.   On: 01/31/2015 14:01   Mr Brain Wo Contrast  02/01/2015  CLINICAL DATA:  Acute encephalopathy. Confusion beginning 3 days ago. Sigmoid diverticulitis. EXAM: MRI HEAD WITHOUT CONTRAST TECHNIQUE: Multiplanar, multiecho pulse sequences of the brain and surrounding structures were obtained  without intravenous contrast. COMPARISON:  CT head without contrast 01/31/2015. FINDINGS: The examination had to be discontinued prior to completion due to patient confusion and refusal for further imaging. The diffusion-weighted imaging demonstrates no acute or subacute infarction. Midline structures are unremarkable. The axial T2 weighted images are significantly degraded by patient motion. Flow is grossly intact within the major intracranial arteries. Fusion of the upper cervical  spine is evident beginning at C3-4. Grade 1 anterolisthesis is present at C2-3. IMPRESSION: 1. No acute intracranial abnormality on this limited study. 2. Grade 1 anterolisthesis at C2-3. Electronically Signed   By: San Morelle M.D.   On: 02/01/2015 10:39   Ct Abdomen Pelvis W Contrast  01/31/2015  CLINICAL DATA:  Right lower quadrant and flank pain for 2 days. Confusion. EXAM: CT ABDOMEN AND PELVIS WITH CONTRAST TECHNIQUE: Multidetector CT imaging of the abdomen and pelvis was performed using the standard protocol following bolus administration of intravenous contrast. CONTRAST:  168mL OMNIPAQUE IOHEXOL 300 MG/ML  SOLN COMPARISON:  08/16/2014 FINDINGS: Lower chest: Coronary atherosclerotic calcification. Ectatic ascending thoracic aorta, 3.8 cm diameter. Thickened interventricular septum at 2.3 cm. Subsegmental atelectasis or scarring in the left lower lobe. Old fractures of the right eighth, ninth, eleventh, and twelfth ribs. Old fractures of the left eleventh and twelfth ribs. Hepatobiliary: Mildly distended gallbladder with several small gallstones. No extrahepatic biliary dilatation. No focal hepatic lesion identified. Pancreas: Fatty atrophy of the pancreas. Spleen: Unremarkable Adrenals/Urinary Tract: Adrenal glands unremarkable. Various renal cysts are present. There are several bilateral renal hyperdense lesions which do not exhibit significant change in density between portal venous and delayed phase images and as  such probably represent complex cysts. We do not have precontrast images to further confirm and lack of enhancement and accordingly a Bosniak classification cannot be assigned. However, none of these lesions appears to significantly change in density between portal venous and delayed phase images which is somewhat reassuring. No stones are identified. This Foley catheter in the urinary bladder. Mild urinary bladder wall thickening with some faint linear low signal within the wall, but not low enough to definitively assigned gas density. Stomach/Bowel: The sigmoid colon diverticulitis for example on image 84 of series 5 with low grade inflammation of a diverticulum of the sigmoid colon adjacent to the cecum. The appendix appears normal. Underlying sigmoid colon diverticulosis is present. Prominent stool throughout the colon favors constipation. Vascular/Lymphatic: Aortoiliac atherosclerotic vascular disease. No pathologic adenopathy. Reproductive: Unremarkable Other: No supplemental non-categorized findings. Musculoskeletal: Atrophic portions of the right gluteus minimus and medius as well as the right gluteus maximus. Dextroconvex upper lumbar scoliosis with levoconvex lower lumbar scoliosis. Lumbar spondylosis and degenerative disc disease is relatively advanced, with intrathecal calcification likely a manifestation of a known arachnoiditis. There is thought to be considerable foraminal stenosis on the right at T12- L1, L2-3, L3-4, and L4-5; and on the left at L1-2, L2-3, L3-4, and L4-5. Degenerative findings of both hips with spurring of the femoral heads and acetabula. IMPRESSION: 1. Mild sigmoid colon diverticulitis.  Appendix normal. 2.  Prominent stool throughout the colon favors constipation. 3. Various renal lesions of differing complexity, likely benign and complex cysts. Bosniak classification cannot be assigned due to the lack of precontrast imaging. If the patient has hematuria or other urinary tract  symptoms, consider dedicated renal protocol MRI with and without contrast for definitive assessment. 4. Severe lumbar scoliosis, spondylosis, and degenerative disc disease, with intrathecal calcifications compatible with arachnoiditis, and multilevel impingement. 5. Other imaging findings of potential clinical significance: Coronary atherosclerosis. Left ventricular hypertrophy. Ectatic ascending thoracic aorta. Old bilateral rib fractures. Cholelithiasis. Fatty atrophy of the pancreas. Aortoiliac atherosclerotic vascular disease. Atrophic portions of the rib right gluteal musculature. Degenerative arthropathy of both hips. Mild urinary bladder wall thickening which could reflect cystitis. Electronically Signed   By: Van Clines M.D.   On: 01/31/2015 16:36   US Renal  01/31/2015  CLINICAL  DATA:  Acute onset of flank pain and acute renal failure. Sepsis. Initial encounter. EXAM: RENAL / URINARY TRACT ULTRASOUND COMPLETE COMPARISON:  CT of the abdomen and pelvis performed earlier today at 4:10 p.m. FINDINGS: Right Kidney: Length: 11.3 cm. Mildly increased parenchymal echogenicity noted. A small 1.8 cm cyst is noted at the upper pole of the right kidney. No hydronephrosis visualized. Left Kidney: Length: 12.2 cm. Mildly increased parenchymal echogenicity noted. Multiple left renal cysts are seen, measuring up to 6.6 x 5.8 x 6.2 cm, demonstrating slight peripheral complexity. No hydronephrosis visualized. Bladder: Appears normal for degree of bladder distention. IMPRESSION: 1. No evidence of hydronephrosis. 2. Mildly increased renal parenchymal echogenicity likely reflects medical renal disease. 3. Bilateral renal cysts noted, larger on the left. Electronically Signed   By: Garald Balding M.D.   On: 01/31/2015 23:37   US Abdomen Limited Ruq  02/03/2015  CLINICAL DATA:  Abnormal liver function studies. History of hypertension and obesity. EXAM: US ABDOMEN LIMITED - RIGHT UPPER QUADRANT COMPARISON:  Renal  ultrasound 01/31/2015.  Abdominal CT 01/31/2015. FINDINGS: Gallbladder: As demonstrated on recent CT, there are several small gallstones. No gallbladder wall thickening, pericholecystic fluid or sonographic Murphy's sign Common bile duct: Diameter: 4 mm. Liver: No focal lesion identified. Within normal limits in parenchymal echogenicity. Other:  Right renal cysts are partially imaged. IMPRESSION: Cholelithiasis without evidence of cholecystitis or biliary dilatation. Electronically Signed   By: Richardean Sale M.D.   On: 02/03/2015 15:14        Filed Weights   01/31/15 1057  Weight: 92.534 kg (204 lb)     Microbiology: Recent Results (from the past 240 hour(s))  Culture, blood (routine x 2)     Status: None (Preliminary result)   Collection Time: 01/31/15 11:52 AM  Result Value Ref Range Status   Specimen Description BLOOD RIGHT HAND  Final   Special Requests BOTTLES DRAWN AEROBIC AND ANAEROBIC 5CC  Final   Culture NO GROWTH 4 DAYS  Final   Report Status PENDING  Incomplete  Culture, blood (routine x 2)     Status: None (Preliminary result)   Collection Time: 01/31/15 11:53 AM  Result Value Ref Range Status   Specimen Description BLOOD RIGHT ANTECUBITAL  Final   Special Requests BOTTLES DRAWN AEROBIC AND ANAEROBIC 5ML  Final   Culture NO GROWTH 4 DAYS  Final   Report Status PENDING  Incomplete  Urine culture     Status: None   Collection Time: 01/31/15  3:42 PM  Result Value Ref Range Status   Specimen Description URINE, CATHETERIZED  Final   Special Requests NONE  Final   Culture NO GROWTH 1 DAY  Final   Report Status 02/01/2015 FINAL  Final       Blood Culture    Component Value Date/Time   SDES URINE, CATHETERIZED 01/31/2015 1542   SPECREQUEST NONE 01/31/2015 1542   CULT NO GROWTH 1 DAY 01/31/2015 1542   REPTSTATUS 02/01/2015 FINAL 01/31/2015 1542      Labs: Results for orders placed or performed during the hospital encounter of 01/31/15 (from the past 48  hour(s))  Acetaminophen level     Status: Abnormal   Collection Time: 02/03/15 10:04 AM  Result Value Ref Range   Acetaminophen (Tylenol), Serum <10 (L) 10 - 30 ug/mL    Comment:        THERAPEUTIC CONCENTRATIONS VARY SIGNIFICANTLY. A RANGE OF 10-30 ug/mL MAY BE AN EFFECTIVE CONCENTRATION FOR MANY PATIENTS. HOWEVER, SOME ARE BEST TREATED AT  CONCENTRATIONS OUTSIDE THIS RANGE. ACETAMINOPHEN CONCENTRATIONS >150 ug/mL AT 4 HOURS AFTER INGESTION AND >50 ug/mL AT 12 HOURS AFTER INGESTION ARE OFTEN ASSOCIATED WITH TOXIC REACTIONS.   Comprehensive metabolic panel     Status: Abnormal   Collection Time: 02/04/15  4:28 AM  Result Value Ref Range   Sodium 141 135 - 145 mmol/L   Potassium 4.4 3.5 - 5.1 mmol/L    Comment: DELTA CHECK NOTED   Chloride 110 101 - 111 mmol/L   CO2 16 (L) 22 - 32 mmol/L   Glucose, Bld 83 65 - 99 mg/dL   BUN 13 6 - 20 mg/dL   Creatinine, Ser 1.19 0.61 - 1.24 mg/dL   Calcium 8.9 8.9 - 10.3 mg/dL   Total Protein 6.1 (L) 6.5 - 8.1 g/dL   Albumin 3.6 3.5 - 5.0 g/dL   AST 146 (H) 15 - 41 U/L   ALT 73 (H) 17 - 63 U/L   Alkaline Phosphatase 61 38 - 126 U/L   Total Bilirubin 1.7 (H) 0.3 - 1.2 mg/dL   GFR calc non Af Amer 56 (L) >60 mL/min   GFR calc Af Amer >60 >60 mL/min    Comment: (NOTE) The eGFR has been calculated using the CKD EPI equation. This calculation has not been validated in all clinical situations. eGFR's persistently <60 mL/min signify possible Chronic Kidney Disease.    Anion gap 15 5 - 15  CBC     Status: Abnormal   Collection Time: 02/04/15  4:28 AM  Result Value Ref Range   WBC 11.6 (H) 4.0 - 10.5 K/uL    Comment: WHITE COUNT CONFIRMED ON SMEAR   RBC 3.91 (L) 4.22 - 5.81 MIL/uL   Hemoglobin 12.8 (L) 13.0 - 17.0 g/dL   HCT 39.1 39.0 - 52.0 %   MCV 100.0 78.0 - 100.0 fL   MCH 32.7 26.0 - 34.0 pg   MCHC 32.7 30.0 - 36.0 g/dL   RDW 14.8 11.5 - 15.5 %   Platelets 228 150 - 400 K/uL    Comment: PLATELET COUNT CONFIRMED BY SMEAR  Blood  gas, arterial     Status: Abnormal   Collection Time: 02/04/15  8:30 AM  Result Value Ref Range   FIO2 0.21    Delivery systems ROOM AIR    pH, Arterial 7.428 7.350 - 7.450   pCO2 arterial 27.8 (L) 35.0 - 45.0 mmHg   pO2, Arterial 72.1 (L) 80.0 - 100.0 mmHg   Bicarbonate 18.1 (L) 20.0 - 24.0 mEq/L   TCO2 18.9 0 - 100 mmol/L   Acid-base deficit 5.5 (H) 0.0 - 2.0 mmol/L   O2 Saturation 94.0 %   Patient temperature 98.6    Collection site RIGHT RADIAL    Sample type ARTERIAL DRAW    Allens test (pass/fail) PASS PASS  Ammonia     Status: None   Collection Time: 02/04/15  8:41 AM  Result Value Ref Range   Ammonia 12 9 - 35 umol/L  Comprehensive metabolic panel     Status: Abnormal   Collection Time: 02/05/15  6:10 AM  Result Value Ref Range   Sodium 137 135 - 145 mmol/L   Potassium 3.1 (L) 3.5 - 5.1 mmol/L   Chloride 105 101 - 111 mmol/L   CO2 21 (L) 22 - 32 mmol/L   Glucose, Bld 84 65 - 99 mg/dL   BUN 10 6 - 20 mg/dL   Creatinine, Ser 1.12 0.61 - 1.24 mg/dL   Calcium 8.5 (L) 8.9 -  10.3 mg/dL   Total Protein 5.9 (L) 6.5 - 8.1 g/dL   Albumin 3.3 (L) 3.5 - 5.0 g/dL   AST 115 (H) 15 - 41 U/L   ALT 74 (H) 17 - 63 U/L   Alkaline Phosphatase 60 38 - 126 U/L   Total Bilirubin 1.1 0.3 - 1.2 mg/dL   GFR calc non Af Amer >60 >60 mL/min   GFR calc Af Amer >60 >60 mL/min    Comment: (NOTE) The eGFR has been calculated using the CKD EPI equation. This calculation has not been validated in all clinical situations. eGFR's persistently <60 mL/min signify possible Chronic Kidney Disease.    Anion gap 11 5 - 15  CBC     Status: Abnormal   Collection Time: 02/05/15  6:10 AM  Result Value Ref Range   WBC 9.8 4.0 - 10.5 K/uL   RBC 3.81 (L) 4.22 - 5.81 MIL/uL   Hemoglobin 12.1 (L) 13.0 - 17.0 g/dL   HCT 36.7 (L) 39.0 - 52.0 %   MCV 96.3 78.0 - 100.0 fL   MCH 31.8 26.0 - 34.0 pg   MCHC 33.0 30.0 - 36.0 g/dL   RDW 14.1 11.5 - 15.5 %   Platelets 252 150 - 400 K/uL     Lipid Panel  No  results found for: CHOL, TRIG, HDL, CHOLHDL, VLDL, LDLCALC, LDLDIRECT   No results found for: HGBA1C   Lab Results  Component Value Date   CREATININE 1.12 02/05/2015     HPI : 81 year old male with history of hypertension, hypothyroidism, constipation, GERD, arthritis of the knee, hyperlipidemia, multiple back surgeries who was brought to the ED by his wife of the patient has been increasingly confused for the past 2-3 days. 3 days back patient complained of being full and having abdominal bloating. He had poor appetite during the evening. The next day he was mostly in bed and had very poor appetite and also was not taking his medications (which he does by himself). Yesterday he was found to be increasingly confused and sleepy. He was also not ambulating much. Wife denies any fever, headache, dizziness, complains of nausea, vomiting, chest pain, palpitations, shortness of breath, polyuria or dysuria. Denies any change in gait, recent use of antibiotic, recent illness, see contact or recent travel. She informs that patient is on multiple medications for constipation but has not had a bowel movement in possibly 3-4 days. Denies any slurred speech or focal weakness. She reports that patient has been having episode of confusion for the past 2 years with some progression.  Course in the ED patient met criteria for sepsis with fever 100.2 Fahrenheit, tachycardic with heart rate of 110, sensitivity 27, elevated blood pressure, WBC of 12.6, acute kidney injury and lactate of 2.67. Cisplatin was initiated and patient given IV normal saline bolus. Foley catheter was placed in due to urinary retention. UA was unremarkable. Blood cultures sent and patient given empiric vancomycin and Zosyn. Chest x-ray was negative for infiltrate. Given his symptoms of abdominal pain a CT of his abdomen and pelvis was done which showed mild sigmoid diverticulitis. Also some prominence to rule out: Previously no lesions of  different complexity (benign and complex cysts). showed lumbar scoliosis, spondylosis and degenerative disc disease, aortoiliac atherosclerotic vascular disease. Also showed urinary bladder wall thickening possible cystitis. Head CT was done for his confusion which was negative for acute findings (showed atrophy and chronic microvascular ischemic changes).   HOSPITAL COURSE: * 1. Sepsis [facility])  Patient met  criteria with fever and tachycardia lactic acidosis and leukocytosis with acute kidney injury upon admission, now improving. Initially placed on vancomycin and Zosyn for suspected sepsis associated with acute sigmoid diverticulitis. Now tolerating PO ciprofloxacin/Flagyl , day #4 , continue for another 7 days then discontinue CT of the abdomen was positive for mild sigmoid diverticulitis although the patient does not have any tenderness on examination. Diarrhea resolved, likely secondary to aggressive constipation regimen that the patient was initiated on because of constipation on CT scan. Resolved after discontinuing stool softener and miralax Chest x-ray is negative. Urine is also clear.     2. Acute encephalopathy. recurred after being started on Dilaudid last night Patient's wife reports baseline intermittent delirium and memory issues for the last 2 years which has never been evaluated by PCP or neurology CT of the head is negative. MRI brain negative as well. TSH free T4 ABG also unremarkable. Ammonia level is not significantly elevated. Improved after stopping Tylenol EEG consistent with toxic/metabolic encephalopathy Withdrawal from opioids versus acute delirium,   switched from Vicodin to by PO Dilaudid based on patient request, and concern for Tylenol toxicity the setting of abnormal liver function Patient got confused after receiving 4 doses of scheduled Dilaudid, hence Dilaudid switched to prn Patient continued to get more and more confused overnight, delusional and  paranoid Psychiatry was consulted,His confusion and agitation are probably due to multifactorial causes. He is gone through a recent infection, probable overuse of pain medication and recent withdrawal They recommended low-dose Ativan at bedtime, this was started and the patient remained less agitated during the night, more oriented this morning Anticipate discharge to SNF today  Patient needs outpatient neurology evaluation for Parkinson's/dementia     3. Urinary retention. Foley catheter was inserted in the ED for urinary retention. Patient voiding without Foley catheter   4. Essential hypertension. Held HCTZ lisinopril Resume lisinopril next week if renal function stable  5 constipation. Resume stool softeners and laxatives as needed  6. Hypokalemia replete, and recheck   7.transaminitis, improving slowly, Tylenol level less than 10, right upper quadrant ultrasound negative, CT scan showed small gallstones but no evidence of cholecystitis. Patient denies any right upper quadrant pain. ammonia level improving without rx, suspect Tylenol toxicity over several months as the patient takes scheduled doses of narcotics  8.right fourth toe discoloration, onychomycosis, arterial duplexnegative for peripheral vascular disease  RIGHT LEFT  ABI 1.3 1.2        Discharge Exam:    Blood pressure 155/86, pulse 96, temperature 98.4 F (36.9 C), temperature source Oral, resp. rate 18, height 5' 11.5" (1.816 m), weight 92.534 kg (204 lb), SpO2 99 %.  General: Appear in mild distress, no Rash; Oral Mucosa moist. Cardiovascular: S1 and S2 Present, no Murmur, no JVD Respiratory: Bilateral Air entry present and Clear to Auscultation, no Crackles, no wheezes Abdomen: Bowel Sound present, Soft and no tenderness Extremities: no Pedal edema, no calf tenderness Neurology: Right-sided foot drop and right ptosis        Follow-up Information    Follow up with Donnajean Lopes, MD.  Schedule an appointment as soon as possible for a visit in 3 days.   Specialty:  Internal Medicine   Contact information:   [place] [address] [phone]       Signed: Reyne Dumas 02/05/2015, 8:24 AM        Time spent >45 mins

## 2015-02-05 NOTE — Progress Notes (Signed)
Call to give report was put on hold 3 times no knew the nurse that was getting the pt.

## 2015-02-05 NOTE — Care Management Note (Signed)
Case Management Note  Patient Details  Name: [name] DOBKIN MRN: [id number] Date of Birth: [birth year]  Subjective/Objective:       Sepsis             Action/Plan: NCM spoke to pt and wife at bedside. No NCM needs identified. Scheduled dc to SNF. CSW following for SNF placement.   Expected Discharge Date:  02/05/2015              Expected Discharge Plan:  [place]  In-House Referral:  Clinical Social Work  Discharge planning Services  CM Consult    Status of Service:  Completed, signed off  Medicare Important Message Given:  Yes Date Medicare IM Given:    Medicare IM give by:    Date Additional Medicare IM Given:    Additional Medicare Important Message give by:     If discussed at [place] of Stay Meetings, dates discussed:    Additional Comments:  Erenest Rasher, RN 02/05/2015, 11:32 AM

## 2015-02-05 NOTE — Care Management Important Message (Signed)
Important Message  Patient Details  Name: [name] MRN: [id number] Date of Birth: 06-17-1933   Medicare Important Message Given:  Yes    Erenest Rasher, RN 02/05/2015, 11:28 AM

## 2015-02-07 ENCOUNTER — Encounter: Payer: Self-pay | Admitting: Internal Medicine

## 2015-02-07 ENCOUNTER — Non-Acute Institutional Stay (SKILLED_NURSING_FACILITY): Payer: Medicare Other | Admitting: Internal Medicine

## 2015-02-07 DIAGNOSIS — K5732 Diverticulitis of large intestine without perforation or abscess without bleeding: Secondary | ICD-10-CM | POA: Diagnosis not present

## 2015-02-07 DIAGNOSIS — K7689 Other specified diseases of liver: Secondary | ICD-10-CM | POA: Diagnosis not present

## 2015-02-07 DIAGNOSIS — E038 Other specified hypothyroidism: Secondary | ICD-10-CM

## 2015-02-07 DIAGNOSIS — I10 Essential (primary) hypertension: Secondary | ICD-10-CM | POA: Diagnosis not present

## 2015-02-07 DIAGNOSIS — E034 Atrophy of thyroid (acquired): Secondary | ICD-10-CM

## 2015-02-07 DIAGNOSIS — K5900 Constipation, unspecified: Secondary | ICD-10-CM

## 2015-02-07 DIAGNOSIS — M549 Dorsalgia, unspecified: Secondary | ICD-10-CM | POA: Diagnosis not present

## 2015-02-07 DIAGNOSIS — G8929 Other chronic pain: Secondary | ICD-10-CM

## 2015-02-07 DIAGNOSIS — R945 Abnormal results of liver function studies: Secondary | ICD-10-CM

## 2015-02-07 NOTE — Progress Notes (Signed)
Patient ID: [name], male   DOB: 28-Nov-1933, 81 y.o.   MRN: [id number]    HISTORY AND PHYSICAL   DATE: 02/07/15  Location:  [facility]    Place of Service: SNF (31)   Extended Emergency Contact Information Primary Emergency Contact: Mallo,[name] H Address: [address] [place]          [place] [address] [place] Phone: [phone] Mobile Phone: [phone] Relation: Spouse  Advanced Directive information  FULL CODE  Chief Complaint  Patient presents with  . New Admit To SNF    HPI:  81 yo male seen today as a new admission into SNF following hospital stay for acute encephalopathy, sigmoid diverticulitis, leg pain with hx lumbar spinal stenosis, HTN, elevated liver enzymes. He was d/c'd on flagyl/cipro to take for 7 additional days.   He reports pain uncontrolled on norco TID. He is followed by pain specialist and recently had lumbar nerve block and will get other side done soon. No abdominal pain, N/V, f/c. No nursing issues. He does have a right heel abrasion which he was keeping moisturized with lotion and right 4th toe dark laceration. Pt is a poor historian due to mental status. Hx obtained from chart. His wife is present  Thyroid - stable on synthroid  HTN - BP controlled on losartan, HCTZ, atenolol, amlodipine. He takes ASA daily He takes zetia and pravachol for cholesteol  GERD/constipation - constipation stable on colace, miralax and senna. He takes omeprazole  Chronic back pain/arthritis - diclofenac tabs, prn norco, gabapentin and prednisone  Past Medical History  Diagnosis Date  . Hypothyroidism   . Vertigo     hx of  . Constipation     from medications taken  . GERD (gastroesophageal reflux disease)   . Arthritis   . Cancer ([place])     skin CA removed from back  . Hypercholesteremia   . Hypertension   . Bruises easily   . Complication of anesthesia     trouble waking up    Past Surgical History  Procedure  Laterality Date  . Tonsillectomy    . Hernia repair    . Knee arthroplasty      right knee X 2; left knee once  . Laminectomy      X 6  . Back surgery    . Eye surgery      Bilateral Cataract surgery   . Cardiac catheterization      Stent X 1  . Coronary angioplasty    . Cervix surgery    . Posterior cervical fusion/foraminotomy  01/28/2012    Procedure: POSTERIOR CERVICAL FUSION/FORAMINOTOMY LEVEL 3;  Surgeon: [name], MD;  Location: [facility];  Service: Neurosurgery;  Laterality: Left;  Posterior Cervical Five-Thoracic One Fusion, Arthrodesis with LEFT Cervical Seven-thoracic One Laminectomy, Foraminotomy and Resection of Synovial Cyst  . Posterior cervical fusion/foraminotomy N/A 01/29/2013    Procedure: POSTERIOR CERVICAL FUSION/FORAMINOTOMY LEVEL 1 and C2-5 Posteriolateral Arthrodesis;  Surgeon: [name], MD;  Location: [facility];  Service: Neurosurgery;  Laterality: N/A;  C2-C3 Laminectomy C2-C3 posterior cervical arthrodesis    Patient Care Team: [name], MD as PCP - General (Internal Medicine)  Social History   Social History  . Marital Status: Married    Spouse Name: N/A  . Number of Children: N/A  . Years of Education: N/A   Occupational History  . Not on file.   Social History Main Topics  . Smoking  status: Former Research scientist (life sciences)  . Smokeless tobacco: Not on file  . Alcohol Use: No     Comment: rare  . Drug Use: No  . Sexual Activity: Not on file   Other Topics Concern  . Not on file   Social History Narrative     reports that he has quit smoking. He does not have any smokeless tobacco history on file. He reports that he does not drink alcohol or use illicit drugs.  No family history on file. No family status information on file.    Immunization History  Administered Date(s) Administered  . Influenza-Unspecified 11/10/2012    Allergies  Allergen Reactions  . Demerol [Meperidine] Nausea And Vomiting     Medications: Patient's Medications  New Prescriptions   No medications on file  Previous Medications   ALENDRONATE (FOSAMAX) 70 MG TABLET    Take 70 mg by mouth Once a week. Sundays   AMLODIPINE (NORVASC) 5 MG TABLET    Take 5 mg by mouth Daily.    ASPIRIN EC 81 MG TABLET    Take 81 mg by mouth at bedtime.   ATENOLOL (TENORMIN) 50 MG TABLET    Take 25 mg by mouth Daily.    CALCIUM CARBONATE (OS-CAL) 600 MG TABS    Take 600 mg by mouth 2 (two) times daily.   CHOLECALCIFEROL (VITAMIN D) 1000 UNITS TABLET    Take 1,000 Units by mouth at bedtime.   CIPROFLOXACIN (CIPRO) 500 MG TABLET    Take 1 tablet (500 mg total) by mouth 2 (two) times daily.   COENZYME Q10 (CO Q 10) 100 MG CAPS    Take 100 mg by mouth at bedtime.   DICLOFENAC (VOLTAREN) 50 MG EC TABLET    Take 50 mg by mouth Twice daily.   DOCUSATE SODIUM (COLACE) 100 MG CAPSULE    Take 200 mg by mouth 2 (two) times daily.    HYDROMORPHONE (DILAUDID) 2 MG TABLET    Take 1 tablet (2 mg total) by mouth every 6 (six) hours as needed for severe pain.   LEVOTHYROXINE (SYNTHROID, LEVOTHROID) 150 MCG TABLET    Take 150 mcg by mouth daily before breakfast.   LORAZEPAM (ATIVAN) 0.5 MG TABLET    Take 1 tablet (0.5 mg total) by mouth at bedtime.   LOSARTAN (COZAAR) 100 MG TABLET    Take 1 tablet (100 mg total) by mouth daily.   METRONIDAZOLE (FLAGYL) 500 MG TABLET    Take 1 tablet (500 mg total) by mouth every 8 (eight) hours.   MULTIPLE VITAMIN (MULTIVITAMIN WITH MINERALS) TABS    Take 1 tablet by mouth at bedtime.   NITROGLYCERIN (NITROSTAT) 0.4 MG SL TABLET    Place 0.4 mg under the tongue every 5 (five) minutes as needed for chest pain.   OMEPRAZOLE (PRILOSEC) 20 MG CAPSULE    Take 20 mg by mouth Daily.   OXYMETAZOLINE (AFRIN) 0.05 % NASAL SPRAY    Place 1 spray into both nostrils 2 (two) times daily.   POLYETHYLENE GLYCOL POWDER (GLYCOLAX/MIRALAX) POWDER    Take 17 g by mouth Once daily as needed. For constipation   POTASSIUM CHLORIDE (K-DUR)  10 MEQ TABLET    Take 1 tablet (10 mEq total) by mouth 2 (two) times daily.   PRAVASTATIN (PRAVACHOL) 40 MG TABLET    Take 40 mg by mouth daily.   PREDNISONE (DELTASONE) 10 MG TABLET    Take 10 mg by mouth Daily.    SENNA (SENOKOT) 8.6 MG TABLET  Take 1-2 tablets by mouth 2 (two) times daily. 2 tabs in the morning and 1 tab at bedtime   TROLAMINE SALICYLATE (ASPERCREME) 10 % CREAM    Apply 1 application topically as needed for muscle pain.   ZETIA 10 MG TABLET    Take 5 mg by mouth at bedtime.   Modified Medications   No medications on file  Discontinued Medications   No medications on file    Review of Systems  Unable to perform ROS: Other  memory loss  Filed Vitals:   02/07/15 2119  BP: 112/58  Pulse: 52  Temp: 97 F (36.1 C)   There is no weight on file to calculate BMI.  Physical Exam  Constitutional: He appears well-developed and well-nourished. No distress.  HENT:  Mouth/Throat: Oropharynx is clear and moist.  Eyes: Pupils are equal, round, and reactive to light. No scleral icterus.  Neck: Neck supple. Carotid bruit is not present. No thyromegaly present.  Cardiovascular: Normal rate, regular rhythm, normal heart sounds and intact distal pulses.  Exam reveals no gallop and no friction rub.   No murmur heard. no distal LE swelling. No calf TTP  Pulmonary/Chest: Effort normal and breath sounds normal. He has no wheezes. He has no rales. He exhibits no tenderness.  Abdominal: Soft. Bowel sounds are normal. He exhibits no distension, no abdominal bruit, no pulsatile midline mass and no mass. There is no tenderness. There is no rebound and no guarding.  Musculoskeletal: He exhibits edema and tenderness.  Lymphadenopathy:    He has no cervical adenopathy.  Neurological: He is alert.  Skin: Skin is warm and dry. No rash noted.  Dark ulcer tip of right 4th toe, dry with no d/c or redness. Right heel abrasion   Psychiatric: He has a normal mood and affect. His behavior is  normal. Thought content normal.     Labs reviewed: Admission on 01/31/2015, Discharged on 02/05/2015  No results displayed because visit has over 200 results.  CBC Latest Ref Rng 02/05/2015 02/04/2015 02/03/2015  WBC 4.0 - 10.5 K/uL 9.8 11.6(H) 10.3  Hemoglobin 13.0 - 17.0 g/dL 12.1(L) 12.8(L) 12.3(L)  Hematocrit 39.0 - 52.0 % 36.7(L) 39.1 36.9(L)  Platelets 150 - 400 K/uL 252 228 187    CMP Latest Ref Rng 02/05/2015 02/04/2015 02/03/2015  Glucose 65 - 99 mg/dL 84 83 86  BUN 6 - 20 mg/dL 10 13 12   Creatinine 0.61 - 1.24 mg/dL 1.12 1.19 1.14  Sodium 135 - 145 mmol/L 137 141 138  Potassium 3.5 - 5.1 mmol/L 3.1(L) 4.4 3.3(L)  Chloride 101 - 111 mmol/L 105 110 107  CO2 22 - 32 mmol/L 21(L) 16(L) 20(L)  Calcium 8.9 - 10.3 mg/dL 8.5(L) 8.9 8.5(L)  Total Protein 6.5 - 8.1 g/dL 5.9(L) 6.1(L) 5.6(L)  Total Bilirubin 0.3 - 1.2 mg/dL 1.1 1.7(H) 1.1  Alkaline Phos 38 - 126 U/L 60 61 62  AST 15 - 41 U/L 115(H) 146(H) 168(H)  ALT 17 - 63 U/L 74(H) 73(H) 68(H)        Dg Chest 2 View  01/31/2015  CLINICAL DATA:  Chest pain, weakness for a few days. History of hypertension. EXAM: CHEST  2 VIEW COMPARISON:  01/18/2012 FINDINGS: Linear densities in the lung bases likely reflect scarring or atelectasis. Heart is normal size. Mild tortuosity of the thoracic aorta. No effusions. No acute bony abnormality. IMPRESSION: Bibasilar linear scarring or atelectasis. Electronically Signed   By: Rolm Baptise M.D.   On: 01/31/2015 13:33   Ct  Head Wo Contrast  01/31/2015  CLINICAL DATA:  Confusion today.  Initial encounter. EXAM: CT HEAD WITHOUT CONTRAST TECHNIQUE: Contiguous axial images were obtained from the base of the skull through the vertex without intravenous contrast. COMPARISON:  None. FINDINGS: There is cortical atrophy noted and chronic microvascular ischemic change. No evidence of acute intracranial abnormality including hemorrhage, infarct, mass lesion, mass effect, midline shift or abnormal  extra-axial fluid collection. No hydrocephalus or pneumocephalus. The calvarium is intact. IMPRESSION: No acute abnormality. Atrophy and chronic microvascular ischemic change. Electronically Signed   By: Inge Rise M.D.   On: 01/31/2015 14:01   Mr Brain Wo Contrast  02/01/2015  CLINICAL DATA:  Acute encephalopathy. Confusion beginning 3 days ago. Sigmoid diverticulitis. EXAM: MRI HEAD WITHOUT CONTRAST TECHNIQUE: Multiplanar, multiecho pulse sequences of the brain and surrounding structures were obtained without intravenous contrast. COMPARISON:  CT head without contrast 01/31/2015. FINDINGS: The examination had to be discontinued prior to completion due to patient confusion and refusal for further imaging. The diffusion-weighted imaging demonstrates no acute or subacute infarction. Midline structures are unremarkable. The axial T2 weighted images are significantly degraded by patient motion. Flow is grossly intact within the major intracranial arteries. Fusion of the upper cervical spine is evident beginning at C3-4. Grade 1 anterolisthesis is present at C2-3. IMPRESSION: 1. No acute intracranial abnormality on this limited study. 2. Grade 1 anterolisthesis at C2-3. Electronically Signed   By: San Morelle M.D.   On: 02/01/2015 10:39   Ct Abdomen Pelvis W Contrast  01/31/2015  CLINICAL DATA:  Right lower quadrant and flank pain for 2 days. Confusion. EXAM: CT ABDOMEN AND PELVIS WITH CONTRAST TECHNIQUE: Multidetector CT imaging of the abdomen and pelvis was performed using the standard protocol following bolus administration of intravenous contrast. CONTRAST:  159mL OMNIPAQUE IOHEXOL 300 MG/ML  SOLN COMPARISON:  08/16/2014 FINDINGS: Lower chest: Coronary atherosclerotic calcification. Ectatic ascending thoracic aorta, 3.8 cm diameter. Thickened interventricular septum at 2.3 cm. Subsegmental atelectasis or scarring in the left lower lobe. Old fractures of the right eighth, ninth, eleventh, and  twelfth ribs. Old fractures of the left eleventh and twelfth ribs. Hepatobiliary: Mildly distended gallbladder with several small gallstones. No extrahepatic biliary dilatation. No focal hepatic lesion identified. Pancreas: Fatty atrophy of the pancreas. Spleen: Unremarkable Adrenals/Urinary Tract: Adrenal glands unremarkable. Various renal cysts are present. There are several bilateral renal hyperdense lesions which do not exhibit significant change in density between portal venous and delayed phase images and as such probably represent complex cysts. We do not have precontrast images to further confirm and lack of enhancement and accordingly a Bosniak classification cannot be assigned. However, none of these lesions appears to significantly change in density between portal venous and delayed phase images which is somewhat reassuring. No stones are identified. This Foley catheter in the urinary bladder. Mild urinary bladder wall thickening with some faint linear low signal within the wall, but not low enough to definitively assigned gas density. Stomach/Bowel: The sigmoid colon diverticulitis for example on image 84 of series 5 with low grade inflammation of a diverticulum of the sigmoid colon adjacent to the cecum. The appendix appears normal. Underlying sigmoid colon diverticulosis is present. Prominent stool throughout the colon favors constipation. Vascular/Lymphatic: Aortoiliac atherosclerotic vascular disease. No pathologic adenopathy. Reproductive: Unremarkable Other: No supplemental non-categorized findings. Musculoskeletal: Atrophic portions of the right gluteus minimus and medius as well as the right gluteus maximus. Dextroconvex upper lumbar scoliosis with levoconvex lower lumbar scoliosis. Lumbar spondylosis and degenerative disc disease is relatively  advanced, with intrathecal calcification likely a manifestation of a known arachnoiditis. There is thought to be considerable foraminal stenosis on the  right at T12- L1, L2-3, L3-4, and L4-5; and on the left at L1-2, L2-3, L3-4, and L4-5. Degenerative findings of both hips with spurring of the femoral heads and acetabula. IMPRESSION: 1. Mild sigmoid colon diverticulitis.  Appendix normal. 2.  Prominent stool throughout the colon favors constipation. 3. Various renal lesions of differing complexity, likely benign and complex cysts. Bosniak classification cannot be assigned due to the lack of precontrast imaging. If the patient has hematuria or other urinary tract symptoms, consider dedicated renal protocol MRI with and without contrast for definitive assessment. 4. Severe lumbar scoliosis, spondylosis, and degenerative disc disease, with intrathecal calcifications compatible with arachnoiditis, and multilevel impingement. 5. Other imaging findings of potential clinical significance: Coronary atherosclerosis. Left ventricular hypertrophy. Ectatic ascending thoracic aorta. Old bilateral rib fractures. Cholelithiasis. Fatty atrophy of the pancreas. Aortoiliac atherosclerotic vascular disease. Atrophic portions of the rib right gluteal musculature. Degenerative arthropathy of both hips. Mild urinary bladder wall thickening which could reflect cystitis. Electronically Signed   By: Van Clines M.D.   On: 01/31/2015 16:36   US Renal  01/31/2015  CLINICAL DATA:  Acute onset of flank pain and acute renal failure. Sepsis. Initial encounter. EXAM: RENAL / URINARY TRACT ULTRASOUND COMPLETE COMPARISON:  CT of the abdomen and pelvis performed earlier today at 4:10 p.m. FINDINGS: Right Kidney: Length: 11.3 cm. Mildly increased parenchymal echogenicity noted. A small 1.8 cm cyst is noted at the upper pole of the right kidney. No hydronephrosis visualized. Left Kidney: Length: 12.2 cm. Mildly increased parenchymal echogenicity noted. Multiple left renal cysts are seen, measuring up to 6.6 x 5.8 x 6.2 cm, demonstrating slight peripheral complexity. No hydronephrosis  visualized. Bladder: Appears normal for degree of bladder distention. IMPRESSION: 1. No evidence of hydronephrosis. 2. Mildly increased renal parenchymal echogenicity likely reflects medical renal disease. 3. Bilateral renal cysts noted, larger on the left. Electronically Signed   By: Garald Balding M.D.   On: 01/31/2015 23:37   US Abdomen Limited Ruq  02/03/2015  CLINICAL DATA:  Abnormal liver function studies. History of hypertension and obesity. EXAM: US ABDOMEN LIMITED - RIGHT UPPER QUADRANT COMPARISON:  Renal ultrasound 01/31/2015.  Abdominal CT 01/31/2015. FINDINGS: Gallbladder: As demonstrated on recent CT, there are several small gallstones. No gallbladder wall thickening, pericholecystic fluid or sonographic Murphy's sign Common bile duct: Diameter: 4 mm. Liver: No focal lesion identified. Within normal limits in parenchymal echogenicity. Other:  Right renal cysts are partially imaged. IMPRESSION: Cholelithiasis without evidence of cholecystitis or biliary dilatation. Electronically Signed   By: Richardean Sale M.D.   On: 02/03/2015 15:14     Assessment/Plan   ICD-9-CM ICD-10-CM   1. Chronic back pain - uncontrolled 724.5 M54.9    338.29 G89.29   2. Sigmoid diverticulitis - improving 562.11 K57.32   3. Abnormal liver function - probably due to #2 573.9 K76.89   4. Essential hypertension - stable 401.9 I10   5. Hypothyroidism due to acquired atrophy of thyroid - stable 244.8 E03.8    246.8 E03.4   6. Constipation, unspecified constipation type - stable 564.00 K59.00     Start hysingla ER 40mg  daily  Change norco 10/325 TID prn breakthrough pain to stop 02/12/15  Wound care eval and tx right heel and right 4th toe ulcer  Cont cipro 500mg  BID and flagyl 500mg  q8hrs x 7 more days  Cont other meds as ordered  Check CBC w diff and CMP in 3 days   PT/OT as ordered  GOAL: short term rehab and d/c home when medically appropriate. Communicated with pt and nursing.  Will  follow  Keyaan Lederman S. Perlie Gold  [facility] [address] [place], [place] [address] [phone] Cell (Monday-Friday 8 AM - 5 PM) [phone] After 5 PM and follow prompts

## 2015-02-08 ENCOUNTER — Other Ambulatory Visit: Payer: Self-pay | Admitting: *Deleted

## 2015-02-08 MED ORDER — HYDROCODONE-ACETAMINOPHEN 10-325 MG PO TABS: ORAL_TABLET | ORAL | Status: DC

## 2015-02-08 NOTE — Telephone Encounter (Signed)
[facility] 

## 2015-02-14 ENCOUNTER — Encounter: Payer: Self-pay | Admitting: Internal Medicine

## 2015-02-14 ENCOUNTER — Non-Acute Institutional Stay (SKILLED_NURSING_FACILITY): Payer: Medicare Other | Admitting: Internal Medicine

## 2015-02-14 DIAGNOSIS — K219 Gastro-esophageal reflux disease without esophagitis: Secondary | ICD-10-CM | POA: Diagnosis not present

## 2015-02-14 DIAGNOSIS — M549 Dorsalgia, unspecified: Secondary | ICD-10-CM | POA: Diagnosis not present

## 2015-02-14 DIAGNOSIS — E034 Atrophy of thyroid (acquired): Secondary | ICD-10-CM

## 2015-02-14 DIAGNOSIS — I10 Essential (primary) hypertension: Secondary | ICD-10-CM

## 2015-02-14 DIAGNOSIS — K5900 Constipation, unspecified: Secondary | ICD-10-CM

## 2015-02-14 DIAGNOSIS — G8929 Other chronic pain: Secondary | ICD-10-CM | POA: Diagnosis not present

## 2015-02-14 DIAGNOSIS — E038 Other specified hypothyroidism: Secondary | ICD-10-CM

## 2015-02-15 DIAGNOSIS — M5416 Radiculopathy, lumbar region: Secondary | ICD-10-CM

## 2015-02-15 DIAGNOSIS — M064 Inflammatory polyarthropathy: Secondary | ICD-10-CM

## 2015-02-15 DIAGNOSIS — M21371 Foot drop, right foot: Secondary | ICD-10-CM

## 2015-02-15 DIAGNOSIS — M4806 Spinal stenosis, lumbar region: Secondary | ICD-10-CM | POA: Diagnosis not present

## 2015-02-22 ENCOUNTER — Encounter: Payer: Self-pay | Admitting: *Deleted

## 2015-03-21 ENCOUNTER — Ambulatory Visit: Payer: BLUE CROSS/BLUE SHIELD | Admitting: Neurology

## 2015-03-30 ENCOUNTER — Ambulatory Visit (INDEPENDENT_AMBULATORY_CARE_PROVIDER_SITE_OTHER): Payer: Medicare Other | Admitting: Neurology

## 2015-03-30 ENCOUNTER — Encounter: Payer: Self-pay | Admitting: Neurology

## 2015-03-30 VITALS — BP 148/72 | HR 77 | Ht 67.0 in | Wt 199.5 lb

## 2015-03-30 DIAGNOSIS — R404 Transient alteration of awareness: Secondary | ICD-10-CM

## 2015-03-30 DIAGNOSIS — G4700 Insomnia, unspecified: Secondary | ICD-10-CM

## 2015-03-30 DIAGNOSIS — E538 Deficiency of other specified B group vitamins: Secondary | ICD-10-CM

## 2015-03-30 DIAGNOSIS — F5104 Psychophysiologic insomnia: Secondary | ICD-10-CM

## 2015-03-30 DIAGNOSIS — M549 Dorsalgia, unspecified: Secondary | ICD-10-CM | POA: Diagnosis not present

## 2015-03-30 DIAGNOSIS — G8929 Other chronic pain: Secondary | ICD-10-CM | POA: Diagnosis not present

## 2015-03-30 HISTORY — DX: Psychophysiologic insomnia: F51.04

## 2015-03-30 HISTORY — DX: Transient alteration of awareness: R40.4

## 2015-03-30 NOTE — Patient Instructions (Signed)
Fall Prevention in the Home  Falls can cause injuries and can affect people from all age groups. There are many simple things that you can do to make your home safe and to help prevent falls. WHAT CAN I DO ON THE OUTSIDE OF MY HOME?  Regularly repair the edges of walkways and driveways and fix any cracks.  Remove high doorway thresholds.  Trim any shrubbery on the main path into your home.  Use bright outdoor lighting.  Clear walkways of debris and clutter, including tools and rocks.  Regularly check that handrails are securely fastened and in good repair. Both sides of any steps should have handrails.  Install guardrails along the edges of any raised decks or porches.  Have leaves, snow, and ice cleared regularly.  Use sand or salt on walkways during winter months.  In the garage, clean up any spills right away, including grease or oil spills. WHAT CAN I DO IN THE BATHROOM?  Use night lights.  Install grab bars by the toilet and in the tub and shower. Do not use towel bars as grab bars.  Use non-skid mats or decals on the floor of the tub or shower.  If you need to sit down while you are in the shower, use a plastic, non-slip stool..  Keep the floor dry. Immediately clean up any water that spills on the floor.  Remove soap buildup in the tub or shower on a regular basis.  Attach bath mats securely with double-sided non-slip rug tape.  Remove throw rugs and other tripping hazards from the floor. WHAT CAN I DO IN THE BEDROOM?  Use night lights.  Make sure that a bedside light is easy to reach.  Do not use oversized bedding that drapes onto the floor.  Have a firm chair that has side arms to use for getting dressed.  Remove throw rugs and other tripping hazards from the floor. WHAT CAN I DO IN THE KITCHEN?   Clean up any spills right away.  Avoid walking on wet floors.  Place frequently used items in easy-to-reach places.  If you need to reach for something  above you, use a sturdy step stool that has a grab bar.  Keep electrical cables out of the way.  Do not use floor polish or wax that makes floors slippery. If you have to use wax, make sure that it is non-skid floor wax.  Remove throw rugs and other tripping hazards from the floor. WHAT CAN I DO IN THE STAIRWAYS?  Do not leave any items on the stairs.  Make sure that there are handrails on both sides of the stairs. Fix handrails that are broken or loose. Make sure that handrails are as long as the stairways.  Check any carpeting to make sure that it is firmly attached to the stairs. Fix any carpet that is loose or worn.  Avoid having throw rugs at the top or bottom of stairways, or secure the rugs with carpet tape to prevent them from moving.  Make sure that you have a light switch at the top of the stairs and the bottom of the stairs. If you do not have them, have them installed. WHAT ARE SOME OTHER FALL PREVENTION TIPS?  Wear closed-toe shoes that fit well and support your feet. Wear shoes that have rubber soles or low heels.  When you use a stepladder, make sure that it is completely opened and that the sides are firmly locked. Have someone hold the ladder while you   are using it. Do not climb a closed stepladder.  Add color or contrast paint or tape to grab bars and handrails in your home. Place contrasting color strips on the first and last steps.  Use mobility aids as needed, such as canes, walkers, scooters, and crutches.  Turn on lights if it is dark. Replace any light bulbs that burn out.  Set up furniture so that there are clear paths. Keep the furniture in the same spot.  Fix any uneven floor surfaces.  Choose a carpet design that does not hide the edge of steps of a stairway.  Be aware of any and all pets.  Review your medicines with your healthcare provider. Some medicines can cause dizziness or changes in blood pressure, which increase your risk of falling. Talk  with your health care provider about other ways that you can decrease your risk of falls. This may include working with a physical therapist or trainer to improve your strength, balance, and endurance.   This information is not intended to replace advice given to you by your health care provider. Make sure you discuss any questions you have with your health care provider.   Document Released: 02/16/2002 Document Revised: 07/13/2014 Document Reviewed: 04/02/2014 Elsevier Interactive Patient Education 2016 [employer]  

## 2015-03-30 NOTE — Progress Notes (Signed)
Reason for visit: Memory disturbance  Referring physician: Dr. Tacy Dura is a 82 y.o. male  History of present illness:  [name] is a 82 year old right-handed white male with a history of chronic low back pain with right lower extremity pain and weakness and numbness. 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 He also suffers from chronic insomnia sleeping only about 5 hours a night. The patient was recently in the hospital on 01/31/2015 with a fever and 2 or 3 days of confusion prior to admission. The patient suffered a delirium state after admission that eventually resolved. The patient underwent rehabilitation, and has returned home. The agent has had some mild issues over the last year or 2 with word finding, and some mild short-term memory problems. This has not been significant in that it does not alter his activities of daily living. He uses a pill dispenser to keep up with his medications, he does the finances, and he pays the bills without issues. The patient does not operate a motor vehicle, but this is mainly because of his back pain and right leg weakness. The patient has had other events of brief staring, some automatic behavior involving the lips and tongue. The patient has had at least 4 observed events over the last 3 years, the last event occurred within the last 2 months. The episode will only last a few moments, the patient does not respond during the events, he does not fall down or have any jerking of the extremities, he does not bite his tongue or lose control the bowels or the bladder. He has not had any falls with ambulation, he does walk with a walker. He is on gabapentin for pain. He is sent to this office for further evaluation. During the November hospitalization, the patient had MRI of the brain, CT of the brain, and EEG evaluation. No acute changes were seen by MRI or CT, the EEG study showed diffuse slowing, but it did show a left frontal sharp  wave, no evidence of electrographic seizure was noted.  Past Medical History  Diagnosis Date  . Hypothyroidism   . Vertigo     hx of  . Constipation     from medications taken  . GERD (gastroesophageal reflux disease)   . Arthritis   . Cancer ([place])     skin CA removed from back  . Hypercholesteremia   . Hypertension   . Bruises easily   . Complication of anesthesia     trouble waking up  . Osteoarthritis   . Chronic insomnia 03/30/2015  . Transient alteration of awareness 03/30/2015    Past Surgical History  Procedure Laterality Date  . Tonsillectomy    . Hernia repair    . Knee arthroplasty      right knee X 2; left knee once  . Laminectomy      X 6  . Back surgery    . Eye surgery      Bilateral Cataract surgery   . Cardiac catheterization      Stent X 1  . Coronary angioplasty    . Cervix surgery    . Posterior cervical fusion/foraminotomy  01/28/2012    Procedure: POSTERIOR CERVICAL FUSION/FORAMINOTOMY LEVEL 3;  Surgeon: [name], MD;  Location: [facility];  Service: Neurosurgery;  Laterality: Left;  Posterior Cervical Five-Thoracic One Fusion, Arthrodesis with LEFT Cervical Seven-thoracic One Laminectomy, Foraminotomy and Resection of Synovial Cyst  . Posterior cervical fusion/foraminotomy N/A  01/29/2013    Procedure: POSTERIOR CERVICAL FUSION/FORAMINOTOMY LEVEL 1 and C2-5 Posteriolateral Arthrodesis;  Surgeon: [name], MD;  Location: [facility];  Service: Neurosurgery;  Laterality: N/A;  C2-C3 Laminectomy C2-C3 posterior cervical arthrodesis    Family History  Problem Relation Age of Onset  . Hypertension Mother   . Cancer Mother   . Kidney failure Father   . Heart disease Father     Social history:  reports that he has quit smoking. He has never used smokeless tobacco. He reports that he drinks alcohol. He reports that he does not use illicit drugs.  Medications:  Prior to Admission medications   Medication Sig Start Date End Date  Taking? Authorizing Provider  alendronate (FOSAMAX) 70 MG tablet Take 70 mg by mouth Once a week. Sundays 01/06/12  Yes Historical Provider, MD  amLODipine (NORVASC) 5 MG tablet Take 5 mg by mouth Daily.  12/23/11  Yes Historical Provider, MD  aspirin EC 81 MG tablet Take 81 mg by mouth at bedtime.   Yes Historical Provider, MD  atenolol (TENORMIN) 25 MG tablet Take 25 mg by mouth daily.   Yes Historical Provider, MD  diclofenac (VOLTAREN) 50 MG EC tablet Take 50 mg by mouth Twice daily. 11/09/11  Yes Historical Provider, MD  docusate sodium (COLACE) 100 MG capsule Take 200 mg by mouth 2 (two) times daily.    Yes Historical Provider, MD  fentaNYL (DURAGESIC - DOSED MCG/HR) 50 MCG/HR Place 50 mcg onto the skin every 3 (three) days.   Yes Historical Provider, MD  gabapentin (NEURONTIN) 600 MG tablet Take 600 mg by mouth 3 (three) times daily.   Yes Historical Provider, MD  levothyroxine (SYNTHROID, LEVOTHROID) 150 MCG tablet Take 150 mcg by mouth daily before breakfast. 12/27/11  Yes Historical Provider, MD  losartan (COZAAR) 100 MG tablet Take 1 tablet (100 mg total) by mouth daily. 02/08/15  Yes Reyne Dumas, MD  nitroGLYCERIN (NITROSTAT) 0.4 MG SL tablet Place 0.4 mg under the tongue every 5 (five) minutes as needed for chest pain.   Yes Historical Provider, MD  omeprazole (PRILOSEC) 20 MG capsule Take 20 mg by mouth Daily. 12/23/11  Yes Historical Provider, MD  polyethylene glycol powder (GLYCOLAX/MIRALAX) powder Take 17 g by mouth Once daily as needed. For constipation 11/02/11  Yes Historical Provider, MD  pravastatin (PRAVACHOL) 40 MG tablet Take 40 mg by mouth daily. 10/29/14  Yes Historical Provider, MD  predniSONE (DELTASONE) 10 MG tablet Take 10 mg by mouth Daily.  01/06/12  Yes Historical Provider, MD  senna (SENOKOT) 8.6 MG tablet Take 1-2 tablets by mouth 2 (two) times daily. 2 tabs in the morning and 1 tab at bedtime   Yes Historical Provider, MD  ZETIA 10 MG tablet Take 5 mg by mouth at  bedtime.  10/15/11  Yes Historical Provider, MD      Allergies  Allergen Reactions  . Demerol [Meperidine] Nausea And Vomiting    ROS:  Out of a complete 14 system review of symptoms, the patient complains only of the following symptoms, and all other reviewed systems are negative.  Constipation Easy bruising Joint pain Runny nose Not enough sleep  Blood pressure 148/72, pulse 77, height 5\' 7"  (1.702 m), weight 199 lb 8 oz (90.493 kg).  Physical Exam  General: The patient is alert and cooperative at the time of the examination.the patient is moderately obese.  Eyes: Pupils are equal, round, and reactive to light. Discs are flat bilaterally.  Neck: The neck  is supple, no carotid bruits are noted.  Respiratory: The respiratory examination is clear.  Cardiovascular: The cardiovascular examination reveals a regular rate and rhythm, no obvious murmurs or rubs are noted.  Skin: Extremities are with 1+ edema below the knees bilaterally.  Neurologic Exam  Mental status: The patient is alert and oriented x 3 at the time of the examination. The patient has apparent normal recent and remote memory, with an apparently normal attention span and concentration ability. Mini-Mental status examination done today shows a total score of 30/30. The patient is able to name 15 animals in 30 seconds.  Cranial nerves: Facial symmetry is present. There is good sensation of the face to pinprick and soft touch bilaterally. The strength of the facial muscles and the muscles to head turning and shoulder shrug are normal bilaterally. Speech is well enunciated, no aphasia or dysarthria is noted. Extraocular movements are full. Visual fields are full. The tongue is midline, and the patient has symmetric elevation of the soft palate. No obvious hearing deficits are noted.  Motor: The motor testing reveals 5 over 5 strength of the upper extremities, with exception of some weakness with intrinsic muscles of the  left hand, atrophy of the first dorsal interosseous muscle. With the lower extremities, there is significant weakness with hip flexion and knee extension on the right, a prominent foot drop as noted and his with inversion and eversion of the right foot is noted. The left leg is of normal strength. Good symmetric motor tone is noted throughout.  Sensory: Sensory testing is intact to pinprick, soft touch, vibration sensation, and position sense on the upper extremities. With the lower extremities, there appears to be a significant decrease in pinprick sensation on the lateral aspect of the right leg below the knee including the foot, vibration sensation position sensation on the right foot are severely impaired, sensation on the left leg is unremarkable. No evidence of extinction is noted.  Coordination: Cerebellar testing reveals good finger-nose-finger and heel-to-shin bilaterally.  Gait and station: Gait is wide-based, unsteady, the patient walks with a walker. Tandem gait was not attempted. Romberg is negative.  Reflexes: Deep tendon reflexes are symmetric, but are depressed bilaterally. Toes are downgoing bilaterally.   MRI brain 02/01/15:  IMPRESSION: 1. No acute intracranial abnormality on this limited study. 2. Grade 1 anterolisthesis at C2-3.  * MRI scan images were reviewed online. I agree with the written report.   EEG 02/02/15:  Impression: This awake and asleep EEG is abnormal due to the presence of: 1. Mild to moderate diffuse slowing of the waking background 2. Single left frontotemporal sharp wave  Clinical Correlation of the above findings indicates diffuse cerebral dysfunction that is non-specific in etiology, and may be seen with hypoxic/ischemic injury, toxic/metabolic encephalopathy, or medication effect. There was a single sharp wave seen over the left frontotemporal region which indicates a possibility for seizures to arise from this region. There were no  electrographic seizures seen in this study.     Assessment/Plan:  1. Episodic transient loss of awareness, possible seizures  2. Mild memory disturbance  3. Chronic low back pain, right leg weakness and numbness  4. Gait disorder  5. Chronic insomnia   The patient has had several episodes of transient loss of awareness that are fully consistent with seizures. The patient himself does not recall the events. The patient has had an EEG study that showed a sharp wave in the left frontal area. We will repeat EEG study. The patient  will be sent for blood work today looking for various etiologies of mild memory disturbance. The patient will follow-up in 3 months. He does not operate a motor vehicle currently. We will follow the memory issues over time.  Jill Alexanders MD 03/30/2015 7:43 PM  [facility] [address] [place], [place] [address]  Phone [phone] Fax [phone]

## 2015-04-02 LAB — COPPER, SERUM: Copper: 97 ug/dL (ref 72–166)

## 2015-04-02 LAB — METHYLMALONIC ACID, SERUM: Methylmalonic Acid: 304 nmol/L (ref 0–378)

## 2015-04-02 LAB — RPR: RPR Ser Ql: NONREACTIVE

## 2015-04-02 LAB — VITAMIN B12: Vitamin B-12: 606 pg/mL (ref 211–946)

## 2015-04-27 ENCOUNTER — Telehealth: Payer: Self-pay | Admitting: Neurology

## 2015-04-27 ENCOUNTER — Ambulatory Visit (INDEPENDENT_AMBULATORY_CARE_PROVIDER_SITE_OTHER): Payer: Medicare Other | Admitting: Neurology

## 2015-04-27 DIAGNOSIS — G8929 Other chronic pain: Secondary | ICD-10-CM

## 2015-04-27 DIAGNOSIS — R4182 Altered mental status, unspecified: Secondary | ICD-10-CM | POA: Diagnosis not present

## 2015-04-27 DIAGNOSIS — R404 Transient alteration of awareness: Secondary | ICD-10-CM

## 2015-04-27 DIAGNOSIS — M549 Dorsalgia, unspecified: Secondary | ICD-10-CM

## 2015-04-27 DIAGNOSIS — F5104 Psychophysiologic insomnia: Secondary | ICD-10-CM

## 2015-04-27 MED ORDER — LEVETIRACETAM 250 MG PO TABS: 250.0000 mg | ORAL_TABLET | Freq: Two times a day (BID) | ORAL | Status: DC

## 2015-04-27 NOTE — Telephone Encounter (Signed)
I called  The patient. The EEG study done today was relatively unremarkable. The study was read out as being normal awake and drowsy, there was one episode of a left mid temporal phase inversion, but this was never repeated, and I did not call the abnormality. The patient wants to get back to driving, his events are clinically consistent with seizures. The patient will be placed on low-dose Keppra , he will follow-up for his appointment in April 2017.

## 2015-04-27 NOTE — Procedures (Signed)
    History:  [name] is a 82 year old gentleman with a history of events of staring off, altered mental status, some automatic behavior with movements involving the lips and tongue. The last event was approximately 3 months prior to this evaluation. The patient is being evaluated for seizures.  This is a routine EEG. No skull defects are noted. Medications include Fosamax, Norvasc, aspirin, Tenormin, diclofenac, fentanyl, Neurontin, Synthroid, losartan, Prilosec, MiraLAX, pravastatin, prednisone, Senokot, and Zetia.   EEG classification: Normal awake and drowsy  Description of the recording: The background rhythms of this recording consists of a fairly well modulated medium amplitude alpha rhythm of 8 Hz that is reactive to eye opening and closure. As the record progresses, the patient appears to remain in the waking state throughout the recording. Photic stimulation was performed, resulting in a bilateral and symmetric photic driving response. Hyperventilation was also performed, resulting in a minimal buildup of the background rhythm activities without significant slowing seen. Toward the end of the recording, the patient enters the drowsy state with slight symmetric slowing seen. The patient never enters stage II sleep. At no time during the recording does there appear to be evidence of spike or spike wave discharges or evidence of focal slowing. EKG monitor shows no evidence of cardiac rhythm abnormalities with a heart rate of 72.  Impression: This is a normal EEG recording in the waking and drowsy state. No evidence of ictal or interictal discharges are seen.

## 2015-04-28 ENCOUNTER — Other Ambulatory Visit: Payer: Self-pay | Admitting: Physical Medicine and Rehabilitation

## 2015-04-28 DIAGNOSIS — M545 Low back pain: Secondary | ICD-10-CM

## 2015-05-09 ENCOUNTER — Ambulatory Visit
Admission: RE | Admit: 2015-05-09 | Discharge: 2015-05-09 | Disposition: A | Discharge status: Home / Self Care | Payer: Medicare Other | Source: Ambulatory Visit | Attending: Physical Medicine and Rehabilitation | Admitting: Physical Medicine and Rehabilitation

## 2015-05-09 DIAGNOSIS — M545 Low back pain: Secondary | ICD-10-CM

## 2015-05-09 MED ORDER — GADOBENATE DIMEGLUMINE 529 MG/ML IV SOLN
18.0000 mL | Freq: Once | INTRAVENOUS | Status: AC | PRN
  Administered 2015-05-09: 18 mL via INTRAVENOUS

## 2015-05-10 ENCOUNTER — Ambulatory Visit (HOSPITAL_BASED_OUTPATIENT_CLINIC_OR_DEPARTMENT_OTHER)
Admission: AD | Admit: 2015-05-10 | Discharge: 2015-05-10 | Disposition: A | Discharge status: Home / Self Care | Payer: Medicare Other | Source: Ambulatory Visit | Attending: Orthopedic Surgery | Admitting: Orthopedic Surgery

## 2015-05-10 ENCOUNTER — Ambulatory Visit (HOSPITAL_BASED_OUTPATIENT_CLINIC_OR_DEPARTMENT_OTHER): Payer: Medicare Other | Admitting: Anesthesiology

## 2015-05-10 ENCOUNTER — Encounter (HOSPITAL_BASED_OUTPATIENT_CLINIC_OR_DEPARTMENT_OTHER): Payer: Self-pay | Admitting: *Deleted

## 2015-05-10 ENCOUNTER — Encounter (HOSPITAL_BASED_OUTPATIENT_CLINIC_OR_DEPARTMENT_OTHER)
Admission: AD | Disposition: A | Discharge status: Home / Self Care | Payer: Self-pay | Source: Ambulatory Visit | Attending: Orthopedic Surgery

## 2015-05-10 DIAGNOSIS — L0291 Cutaneous abscess, unspecified: Secondary | ICD-10-CM | POA: Insufficient documentation

## 2015-05-10 DIAGNOSIS — E7800 Pure hypercholesterolemia, unspecified: Secondary | ICD-10-CM | POA: Insufficient documentation

## 2015-05-10 DIAGNOSIS — I10 Essential (primary) hypertension: Secondary | ICD-10-CM | POA: Insufficient documentation

## 2015-05-10 DIAGNOSIS — Z791 Long term (current) use of non-steroidal anti-inflammatories (NSAID): Secondary | ICD-10-CM | POA: Diagnosis not present

## 2015-05-10 DIAGNOSIS — Z7983 Long term (current) use of bisphosphonates: Secondary | ICD-10-CM | POA: Diagnosis not present

## 2015-05-10 DIAGNOSIS — Z87891 Personal history of nicotine dependence: Secondary | ICD-10-CM | POA: Diagnosis not present

## 2015-05-10 DIAGNOSIS — M1990 Unspecified osteoarthritis, unspecified site: Secondary | ICD-10-CM | POA: Insufficient documentation

## 2015-05-10 DIAGNOSIS — K219 Gastro-esophageal reflux disease without esophagitis: Secondary | ICD-10-CM | POA: Diagnosis not present

## 2015-05-10 DIAGNOSIS — Z79899 Other long term (current) drug therapy: Secondary | ICD-10-CM | POA: Insufficient documentation

## 2015-05-10 DIAGNOSIS — Z7982 Long term (current) use of aspirin: Secondary | ICD-10-CM | POA: Insufficient documentation

## 2015-05-10 DIAGNOSIS — E039 Hypothyroidism, unspecified: Secondary | ICD-10-CM | POA: Insufficient documentation

## 2015-05-10 DIAGNOSIS — L02511 Cutaneous abscess of right hand: Secondary | ICD-10-CM | POA: Diagnosis present

## 2015-05-10 HISTORY — PX: I&D EXTREMITY: SHX5045

## 2015-05-10 SURGERY — IRRIGATION AND DEBRIDEMENT EXTREMITY
Anesthesia: General | Site: Finger | Laterality: Right

## 2015-05-10 MED ORDER — FENTANYL CITRATE (PF) 100 MCG/2ML IJ SOLN: 25.0000 ug | INTRAMUSCULAR | Status: DC | PRN

## 2015-05-10 MED ORDER — CEFAZOLIN SODIUM-DEXTROSE 2-3 GM-% IV SOLR
INTRAVENOUS | Status: AC
  Filled 2015-05-10: qty 50

## 2015-05-10 MED ORDER — LIDOCAINE HCL (CARDIAC) 20 MG/ML IV SOLN
INTRAVENOUS | Status: AC
  Filled 2015-05-10: qty 5

## 2015-05-10 MED ORDER — DEXAMETHASONE SODIUM PHOSPHATE 10 MG/ML IJ SOLN
INTRAMUSCULAR | Status: AC
  Filled 2015-05-10: qty 1

## 2015-05-10 MED ORDER — FENTANYL CITRATE (PF) 100 MCG/2ML IJ SOLN
50.0000 ug | INTRAMUSCULAR | Status: DC | PRN
  Administered 2015-05-10: 25 ug via INTRAVENOUS

## 2015-05-10 MED ORDER — ONDANSETRON HCL 4 MG/2ML IJ SOLN
INTRAMUSCULAR | Status: AC
  Filled 2015-05-10: qty 2

## 2015-05-10 MED ORDER — FENTANYL CITRATE (PF) 100 MCG/2ML IJ SOLN
INTRAMUSCULAR | Status: AC
  Filled 2015-05-10: qty 2

## 2015-05-10 MED ORDER — BUPIVACAINE HCL (PF) 0.25 % IJ SOLN
INTRAMUSCULAR | Status: DC | PRN
  Administered 2015-05-10: 10 mL

## 2015-05-10 MED ORDER — LIDOCAINE HCL (CARDIAC) 20 MG/ML IV SOLN
INTRAVENOUS | Status: DC | PRN
  Administered 2015-05-10: 60 mg via INTRAVENOUS

## 2015-05-10 MED ORDER — CEFAZOLIN SODIUM-DEXTROSE 2-3 GM-% IV SOLR
INTRAVENOUS | Status: DC | PRN
  Administered 2015-05-10: 2 g via INTRAVENOUS

## 2015-05-10 MED ORDER — HYDROCODONE-ACETAMINOPHEN 5-325 MG PO TABS
1.0000 | ORAL_TABLET | Freq: Four times a day (QID) | ORAL | Status: DC | PRN
  Administered 2015-05-10: 1 via ORAL

## 2015-05-10 MED ORDER — LACTATED RINGERS IV SOLN
INTRAVENOUS | Status: DC
Start: 2015-05-10 — End: 2015-05-10
  Administered 2015-05-10: 10 mL/h via INTRAVENOUS

## 2015-05-10 MED ORDER — PROPOFOL 10 MG/ML IV BOLUS
INTRAVENOUS | Status: DC | PRN
  Administered 2015-05-10: 200 mg via INTRAVENOUS

## 2015-05-10 MED ORDER — SCOPOLAMINE 1 MG/3DAYS TD PT72: 1.0000 | MEDICATED_PATCH | Freq: Once | TRANSDERMAL | Status: DC | PRN

## 2015-05-10 MED ORDER — MIDAZOLAM HCL 2 MG/2ML IJ SOLN: 1.0000 mg | INTRAMUSCULAR | Status: DC | PRN

## 2015-05-10 MED ORDER — HYDROCODONE-ACETAMINOPHEN 5-325 MG PO TABS: ORAL_TABLET | ORAL | Status: DC

## 2015-05-10 MED ORDER — ONDANSETRON HCL 4 MG/2ML IJ SOLN
INTRAMUSCULAR | Status: DC | PRN
  Administered 2015-05-10: 4 mg via INTRAVENOUS

## 2015-05-10 MED ORDER — ONDANSETRON HCL 4 MG/2ML IJ SOLN: 4.0000 mg | Freq: Once | INTRAMUSCULAR | Status: DC | PRN

## 2015-05-10 MED ORDER — DOXYCYCLINE HYCLATE 50 MG PO CAPS: 100.0000 mg | ORAL_CAPSULE | Freq: Two times a day (BID) | ORAL | Status: DC

## 2015-05-10 MED ORDER — HYDROCODONE-ACETAMINOPHEN 5-325 MG PO TABS
ORAL_TABLET | ORAL | Status: AC
  Filled 2015-05-10: qty 1

## 2015-05-10 MED ORDER — HYDROCODONE-ACETAMINOPHEN 7.5-325 MG PO TABS: 1.0000 | ORAL_TABLET | Freq: Once | ORAL | Status: DC | PRN

## 2015-05-10 MED ORDER — PROPOFOL 10 MG/ML IV BOLUS
INTRAVENOUS | Status: AC
  Filled 2015-05-10: qty 40

## 2015-05-10 MED ORDER — GLYCOPYRROLATE 0.2 MG/ML IJ SOLN: 0.2000 mg | Freq: Once | INTRAMUSCULAR | Status: DC | PRN

## 2015-05-10 MED ORDER — CHLORHEXIDINE GLUCONATE 4 % EX LIQD: 60.0000 mL | Freq: Once | CUTANEOUS | Status: DC

## 2015-05-10 MED ORDER — MIDAZOLAM HCL 2 MG/2ML IJ SOLN
INTRAMUSCULAR | Status: AC
  Filled 2015-05-10: qty 2

## 2015-05-10 MED ORDER — BUPIVACAINE HCL (PF) 0.25 % IJ SOLN
INTRAMUSCULAR | Status: AC
  Filled 2015-05-10: qty 30

## 2015-05-10 MED ORDER — 0.9 % SODIUM CHLORIDE (POUR BTL) OPTIME
TOPICAL | Status: DC | PRN
  Administered 2015-05-10: 500 mL

## 2015-05-10 SURGICAL SUPPLY — 53 items
BAG DECANTER FOR FLEXI CONT (MISCELLANEOUS) IMPLANT
BANDAGE ACE 3X5.8 VEL STRL LF (GAUZE/BANDAGES/DRESSINGS) IMPLANT
BLADE MINI RND TIP GREEN BEAV (BLADE) IMPLANT
BLADE SURG 15 STRL LF DISP TIS (BLADE) ×2 IMPLANT
BLADE SURG 15 STRL SS (BLADE) ×3
BNDG CMPR 9X4 STRL LF SNTH (GAUZE/BANDAGES/DRESSINGS) ×1
BNDG COHESIVE 1X5 TAN STRL LF (GAUZE/BANDAGES/DRESSINGS) ×2 IMPLANT
BNDG ELASTIC 2X5.8 VLCR STR LF (GAUZE/BANDAGES/DRESSINGS) IMPLANT
BNDG ESMARK 4X9 LF (GAUZE/BANDAGES/DRESSINGS) ×2 IMPLANT
BNDG GAUZE 1X2.1 STRL (MISCELLANEOUS) IMPLANT
BNDG GAUZE ELAST 4 BULKY (GAUZE/BANDAGES/DRESSINGS) IMPLANT
BRUSH SCRUB EZ PLAIN DRY (MISCELLANEOUS) ×1 IMPLANT
CHLORAPREP W/TINT 26ML (MISCELLANEOUS) ×3 IMPLANT
CORDS BIPOLAR (ELECTRODE) ×3 IMPLANT
COVER BACK TABLE 60X90IN (DRAPES) ×3 IMPLANT
COVER MAYO STAND STRL (DRAPES) ×3 IMPLANT
CUFF TOURNIQUET SINGLE 18IN (TOURNIQUET CUFF) ×3 IMPLANT
DRAPE EXTREMITY T 121X128X90 (DRAPE) ×3 IMPLANT
DRAPE SURG 17X23 STRL (DRAPES) ×3 IMPLANT
GAUZE PACKING IODOFORM 1/4X15 (GAUZE/BANDAGES/DRESSINGS) ×3 IMPLANT
GAUZE SPONGE 4X4 12PLY STRL (GAUZE/BANDAGES/DRESSINGS) ×1 IMPLANT
GAUZE XEROFORM 1X8 LF (GAUZE/BANDAGES/DRESSINGS) ×3 IMPLANT
GLOVE BIO SURGEON STRL SZ7.5 (GLOVE) ×3 IMPLANT
GLOVE BIOGEL PI IND STRL 7.0 (GLOVE) ×1 IMPLANT
GLOVE BIOGEL PI IND STRL 8 (GLOVE) ×1 IMPLANT
GLOVE BIOGEL PI INDICATOR 7.0 (GLOVE) ×2
GLOVE BIOGEL PI INDICATOR 8 (GLOVE) ×2
GLOVE ECLIPSE 6.5 STRL STRAW (GLOVE) ×3 IMPLANT
GOWN STRL REUS W/ TWL LRG LVL3 (GOWN DISPOSABLE) ×1 IMPLANT
GOWN STRL REUS W/TWL LRG LVL3 (GOWN DISPOSABLE) ×3
GOWN STRL REUS W/TWL XL LVL3 (GOWN DISPOSABLE) ×3 IMPLANT
LOOP VESSEL MAXI BLUE (MISCELLANEOUS) IMPLANT
NDL HYPO 25X1 1.5 SAFETY (NEEDLE) IMPLANT
NEEDLE HYPO 25X1 1.5 SAFETY (NEEDLE) ×3 IMPLANT
NS IRRIG 1000ML POUR BTL (IV SOLUTION) ×3 IMPLANT
PACK BASIN DAY SURGERY FS (CUSTOM PROCEDURE TRAY) ×3 IMPLANT
PAD CAST 3X4 CTTN HI CHSV (CAST SUPPLIES) IMPLANT
PADDING CAST ABS 4INX4YD NS (CAST SUPPLIES)
PADDING CAST ABS COTTON 4X4 ST (CAST SUPPLIES) ×1 IMPLANT
PADDING CAST COTTON 3X4 STRL (CAST SUPPLIES)
SPLINT FINGER 3.25 911903 (SOFTGOODS) ×3 IMPLANT
SPLINT PLASTER CAST XFAST 3X15 (CAST SUPPLIES) IMPLANT
SPLINT PLASTER XTRA FASTSET 3X (CAST SUPPLIES)
STOCKINETTE 4X48 STRL (DRAPES) ×3 IMPLANT
SUT ETHILON 4 0 PS 2 18 (SUTURE) IMPLANT
SWAB COLLECTION DEVICE MRSA (MISCELLANEOUS) ×2 IMPLANT
SWAB CULTURE ESWAB REG 1ML (MISCELLANEOUS) ×2 IMPLANT
SYR BULB 3OZ (MISCELLANEOUS) ×3 IMPLANT
SYR CONTROL 10ML LL (SYRINGE) ×2 IMPLANT
TOWEL OR 17X24 6PK STRL BLUE (TOWEL DISPOSABLE) ×3 IMPLANT
TRAY DSU PREP LF (CUSTOM PROCEDURE TRAY) ×3 IMPLANT
TUBE FEEDING 5FR 15 INCH (TUBING) IMPLANT
UNDERPAD 30X30 (UNDERPADS AND DIAPERS) ×3 IMPLANT

## 2015-05-10 NOTE — Op Note (Signed)
NAMEPIOTR, MAHURIN NO.:  [id number]  MEDICAL RECORD NO.:  [id number]  LOCATION:                                 FACILITY:  PHYSICIAN:  Leanora Cover, MD        DATE OF BIRTH:  [birth year]  DATE OF PROCEDURE:  05/10/2015 DATE OF DISCHARGE:                              OPERATIVE REPORT   PREOPERATIVE DIAGNOSIS:  Right long finger abscess.  POSTOPERATIVE DIAGNOSIS:  Right long finger subcutaneous abscess.  PROCEDURE:   1. Incision and drainage of right long finger abscess, including skin and subcutaneous tissues with sharp debridement of subcutaneous tissues with knife.  SURGEON:  Leanora Cover, MD.  ASSISTANT:  None.  ANESTHESIA:  General.  IV FLUIDS:  Per anesthesia flow sheet.  ESTIMATED BLOOD LOSS:  Minimal.  COMPLICATIONS:  None.  SPECIMENS:  Cultures to micro.  TOURNIQUET TIME:  11 minutes.  DISPOSITION:  Stable to PACU.  INDICATIONS:  Mr. Cata is an 82 year old right-hand dominant male, who has been having problems with his right long finger for 1-2 days.  He was seen in an Urgent Care yesterday and started on Augmentin.  He has had worsening of his symptoms since then.  He was seen by his dermatologist today who referred him to me for further evaluation and care.  On examination, he had an erythematous and swollen right long finger.  I recommended incision and drainage in the operating room.  The risks, benefits, and alternatives of surgery were discussed including risk of blood loss; infection; damage to nerves, vessels, tendons, ligaments, bone; failure of surgery; need for additional surgery; complications with wound healing; continued pain; continued infection; need for repeat irrigation and debridement.  He voiced understanding of these risks and elected to proceed.  OPERATIVE COURSE:  After being identified preoperatively by myself, the patient and I agreed upon procedure and site of procedure.  Surgical site was marked.  The risks,  benefits, and alternatives of surgery were reviewed and he wished to proceed.  Surgical consent had been signed. He was given no antibiotics for pending cultures.  He was transferred to the operating room and placed on the operating table in supine position with the right upper extremity on arm board.  General anesthesia was induced by Anesthesiology.  Right upper extremity was prepped and draped in normal sterile orthopedic fashion.  Surgical pause was performed between surgeons, anesthesia, operating staff, and all were in agreement as to the patient, procedure, and site of procedure.  Tourniquet at the proximal aspect of the extremity was inflated to 250 mmHg after exsanguination of the limb with an Esmarch bandage.  An incision was made on the dorsum of the right long finger including the area of the erythema.  Carried down to subcutaneous tissues by spreading technique. No gross purulence was obtained, but there was cloudy fluid.  Cultures were taken and sent to micro for cultures.  The wound was copiously irrigated with sterile saline.  Bipolar electrocautery was used to obtain hemostasis.  The wound was then packed with quarter-inch iodoform gauze.  A digital block was performed with 10 mL of 0.25% plain Marcaine to aid in postoperative analgesia.  The wound was then dressed with sterile 4x4s and wrapped with a Coban dressing lightly.  An AlumaFoam splint was placed and wrapped lightly with Coban dressing.  Tourniquet deflated at 11 minutes.  Fingertips were pink with brisk capillary refill after deflation of Tourniquet.  Operative drapes were broken down.  The patient was awakened from anesthesia safely.  He was transferred back to the stretcher and taken to PACU in stable condition. I will see him back in the office in 3-4 days for postoperative followup.  I will give him hydrocodone 5/325, 1-2 p.o. q.6 hours p.r.n. pain, dispensed #20.  Doxycycline 100 mg p.o. b.i.d. x7  days.     Leanora Cover, MD     KK/MEDQ  D:  05/10/2015  T:  05/10/2015  Job:  [id number]

## 2015-05-10 NOTE — Op Note (Signed)
[id number] 

## 2015-05-10 NOTE — Anesthesia Preprocedure Evaluation (Addendum)
Anesthesia Evaluation  Patient identified by MRN, date of birth, ID band Patient awake    Reviewed: Allergy & Precautions, NPO status , Patient's Chart, lab work & pertinent test results  History of Anesthesia Complications (+) PROLONGED EMERGENCE and history of anesthetic complications  Airway Mallampati: II  TM Distance: >3 FB Neck ROM: Full    Dental no notable dental hx. (+) Dental Advisory Given   Pulmonary former smoker,    Pulmonary exam normal breath sounds clear to auscultation       Cardiovascular hypertension, Pt. on medications + CAD and + Cardiac Stents  Normal cardiovascular exam Rhythm:Regular Rate:Normal     Neuro/Psych negative neurological ROS  negative psych ROS   GI/Hepatic Neg liver ROS, GERD  Medicated and Controlled,  Endo/Other  Hypothyroidism   Renal/GU negative Renal ROS  negative genitourinary   Musculoskeletal  (+) Arthritis ,   Abdominal   Peds negative pediatric ROS (+)  Hematology negative hematology ROS (+)   Anesthesia Other Findings Denies active cardiac symptoms, no angina, no signs of CHF or arrythmia.. Single cardiac stent in 1998 with no issues since then.. No anesthesia issues except delayed emergence  Lungs clear, no murmur noticed, no lower extremity edema  Reproductive/Obstetrics negative OB ROS                            Anesthesia Physical Anesthesia Plan  ASA: III  Anesthesia Plan: General   Post-op Pain Management:    Induction: Intravenous  Airway Management Planned: LMA  Additional Equipment:   Intra-op Plan:   Post-operative Plan: Extubation in OR  Informed Consent: I have reviewed the patients History and Physical, chart, labs and discussed the procedure including the risks, benefits and alternatives for the proposed anesthesia with the patient or authorized representative who has indicated his/her understanding and  acceptance.   Dental advisory given  Plan Discussed with: CRNA  Anesthesia Plan Comments:         Anesthesia Quick Evaluation

## 2015-05-10 NOTE — Transfer of Care (Signed)
Immediate Anesthesia Transfer of Care Note  Patient: [name]  Procedure(s) Performed: Procedure(s): IRRIGATION AND DEBRIDEMENT EXTREMITY (Right)  Patient Location: PACU  Anesthesia Type:General  Level of Consciousness: awake, alert , oriented and patient cooperative  Airway & Oxygen Therapy: Patient Spontanous Breathing and Patient connected to face mask oxygen  Post-op Assessment: Report given to RN, Post -op Vital signs reviewed and stable and Patient moving all extremities  Post vital signs: Reviewed and stable  Last Vitals:  Filed Vitals:   05/10/15 1509  BP: 133/64  Pulse: 75  Temp: 36.5 C  Resp: 18    Complications: No apparent anesthesia complications

## 2015-05-10 NOTE — Discharge Instructions (Addendum)

## 2015-05-10 NOTE — Brief Op Note (Signed)
05/10/2015  5:35 PM  PATIENT:  Cecille Aver  82 y.o. male  PRE-OPERATIVE DIAGNOSIS:  right long finger infection   POST-OPERATIVE DIAGNOSIS:  Right long finger abscess subcutaneous  PROCEDURE:  Procedure(s): IRRIGATION AND DEBRIDEMENT EXTREMITY (Right)  SURGEON:  Surgeon(s) and Role:    * Leanora Cover, MD - Primary  PHYSICIAN ASSISTANT:   ASSISTANTS: none   ANESTHESIA:   general  EBL:  Total I/O In: 300 [I.V.:300] Out: -   BLOOD ADMINISTERED:none  DRAINS: iodoform packing  LOCAL MEDICATIONS USED:  MARCAINE     SPECIMEN:  Source of Specimen:  right long finger  DISPOSITION OF SPECIMEN:  micro  COUNTS:  YES  TOURNIQUET:   Total Tourniquet Time Documented: Upper Arm (Right) - 11 minutes Total: Upper Arm (Right) - 11 minutes   DICTATION: .Other Dictation: Dictation Number [id number]  PLAN OF CARE: Discharge to home after PACU  PATIENT DISPOSITION:  PACU - hemodynamically stable.

## 2015-05-10 NOTE — Anesthesia Postprocedure Evaluation (Signed)
Anesthesia Post Note  Patient: [name]  Procedure(s) Performed: Procedure(s) (LRB): IRRIGATION AND DEBRIDEMENT EXTREMITY (Right)  Patient location during evaluation: PACU Anesthesia Type: General and Regional Level of consciousness: awake and alert Pain management: pain level controlled Vital Signs Assessment: post-procedure vital signs reviewed and stable Respiratory status: spontaneous breathing, nonlabored ventilation, respiratory function stable and patient connected to nasal cannula oxygen Cardiovascular status: blood pressure returned to baseline and stable Postop Assessment: no signs of nausea or vomiting Anesthetic complications: no    Last Vitals:  Filed Vitals:   05/10/15 1509 05/10/15 1745  BP: 133/64 105/58  Pulse: 75 72  Temp: 36.5 C 36.5 C  Resp: 18 15    Last Pain: There were no vitals filed for this visit.               Zenaida Deed

## 2015-05-10 NOTE — H&P (Addendum)
[name] is an 82 y.o. male.   Chief Complaint: right long finger infection HPI: 82 yo rhd male states he began to have problems with right long finger over past 1-2 days.  Does not remember an injury or wound.  No fevers, chills, night sweats.  Started on augmentin at urgent care yesterday with worsening of erythema today.  Seen by Dr. Ronnald Ramp at [facility] and referred for evaluation.  Has had previous wrist fracture on right.  Allergies:  Allergies  Allergen Reactions  . Demerol [Meperidine] Nausea And Vomiting    Past Medical History  Diagnosis Date  . Hypothyroidism   . Vertigo     hx of  . Constipation     from medications taken  . GERD (gastroesophageal reflux disease)   . Arthritis   . Cancer ([place])     skin CA removed from back  . Hypercholesteremia   . Hypertension   . Bruises easily   . Complication of anesthesia     trouble waking up  . Osteoarthritis   . Chronic insomnia 03/30/2015  . Transient alteration of awareness 03/30/2015    Past Surgical History  Procedure Laterality Date  . Tonsillectomy    . Hernia repair    . Knee arthroplasty      right knee X 2; left knee once  . Laminectomy      X 6  . Back surgery    . Eye surgery      Bilateral Cataract surgery   . Cardiac catheterization      Stent X 1  . Coronary angioplasty    . Cervix surgery    . Posterior cervical fusion/foraminotomy  01/28/2012    Procedure: POSTERIOR CERVICAL FUSION/FORAMINOTOMY LEVEL 3;  Surgeon: [name], MD;  Location: [place] [facility];  Service: Neurosurgery;  Laterality: Left;  Posterior Cervical Five-Thoracic One Fusion, Arthrodesis with LEFT Cervical Seven-thoracic One Laminectomy, Foraminotomy and Resection of Synovial Cyst  . Posterior cervical fusion/foraminotomy N/A 01/29/2013    Procedure: POSTERIOR CERVICAL FUSION/FORAMINOTOMY LEVEL 1 and C2-5 Posteriolateral Arthrodesis;  Surgeon: [name], MD;  Location: [place] [facility];  Service:  Neurosurgery;  Laterality: N/A;  C2-C3 Laminectomy C2-C3 posterior cervical arthrodesis    Family History: Family History  Problem Relation Age of Onset  . Hypertension Mother   . Cancer Mother   . Kidney failure Father   . Heart disease Father     Social History:   reports that he has quit smoking. He has never used smokeless tobacco. He reports that he drinks alcohol. He reports that he does not use illicit drugs.  Medications: Medications Prior to Admission  Medication Sig Dispense Refill  . alendronate (FOSAMAX) 70 MG tablet Take 70 mg by mouth Once a week. Sundays    . amLODipine (NORVASC) 5 MG tablet Take 5 mg by mouth Daily.     Marland Kitchen aspirin EC 81 MG tablet Take 81 mg by mouth at bedtime.    Marland Kitchen atenolol (TENORMIN) 25 MG tablet Take 25 mg by mouth daily.    . diclofenac (VOLTAREN) 50 MG EC tablet Take 50 mg by mouth Twice daily.    Marland Kitchen docusate sodium (COLACE) 100 MG capsule Take 200 mg by mouth 2 (two) times daily.     Marland Kitchen gabapentin (NEURONTIN) 600 MG tablet Take 600 mg by mouth 3 (three) times daily.    Marland Kitchen HYDROcodone-acetaminophen (NORCO/VICODIN) 5-325 MG tablet Take 1 tablet by mouth every 6 (six) hours as needed for moderate  pain.    . levothyroxine (SYNTHROID, LEVOTHROID) 150 MCG tablet Take 150 mcg by mouth daily before breakfast.    . losartan (COZAAR) 100 MG tablet Take 1 tablet (100 mg total) by mouth daily. 30 tablet 0  . omeprazole (PRILOSEC) 20 MG capsule Take 20 mg by mouth Daily.    . polyethylene glycol powder (GLYCOLAX/MIRALAX) powder Take 17 g by mouth Once daily as needed. For constipation    . pravastatin (PRAVACHOL) 40 MG tablet Take 40 mg by mouth daily.  2  . predniSONE (DELTASONE) 10 MG tablet Take 10 mg by mouth Daily.     Marland Kitchen senna (SENOKOT) 8.6 MG tablet Take 1-2 tablets by mouth 2 (two) times daily. 2 tabs in the morning and 1 tab at bedtime    . ZETIA 10 MG tablet Take 5 mg by mouth at bedtime.     . fentaNYL (DURAGESIC - DOSED MCG/HR) 50 MCG/HR Place 50  mcg onto the skin every 3 (three) days.    Marland Kitchen levETIRAcetam (KEPPRA) 250 MG tablet Take 1 tablet (250 mg total) by mouth 2 (two) times daily. 60 tablet 3  . nitroGLYCERIN (NITROSTAT) 0.4 MG SL tablet Place 0.4 mg under the tongue every 5 (five) minutes as needed for chest pain.      No results found for this or any previous visit (from the past 48 hour(s)).  [name] Contrast  05/09/2015  CLINICAL DATA:  Chronic progressive right-sided low back pain. Paresthesias in the right leg with right foot numbness. EXAM: MRI LUMBAR SPINE WITHOUT AND WITH CONTRAST TECHNIQUE: Multiplanar and multiecho pulse sequences of the lumbar spine were obtained without and with intravenous contrast. CONTRAST:  41mL MULTIHANCE GADOBENATE DIMEGLUMINE 529 MG/ML IV SOLN COMPARISON:  X-rays dated 08/16/2014, CT scan abdomen and pelvis dated 01/31/2015 and lumbar MRI dated 04/11/2011 FINDINGS: Conus tip that T12-L1. T11-12: Hypertrophy the ligamentum flavum and facet joints with slight narrowing of the spinal canal to small disc extrusion extending superiorly behind the body of T11 on the right. Moderate severe bilateral foraminal stenosis, left more than right. T12-L1: Severe bilateral foraminal stenosis, slightly progressed. Broad-based disc protrusion with accompanying osteophytes asymmetric to right. Slight hypertrophy of the ligamentum flavum. L1-2: Severe left foraminal stenosis, slightly progressed. Marked disc space narrowing. Broad-based disc protrusion with accompanying osteophytes with moderately severe right foraminal stenosis. Posterior decompression. L2-3: Severe right foraminal stenosis, progressed. Severe left foraminal stenosis also progressed. Broad-based disc protrusion with accompanying osteophytes, slightly progressed. Nerve roots are clumped consistent with arachnoiditis. Superior facet of L3 on the right protrudes into the right lateral recess. Posterior decompression. Transverse diameter of the spinal  canal is narrowed due to the hypertrophied facet joints. L3-4: Severe right foraminal stenosis, unchanged. Small broad-based disc osteophyte complex. Moderately severe left foraminal stenosis, unchanged. Nerve roots are clumped within the thecal sac consistent with arachnoiditis. There is calcification in thecal sac as demonstrated on the prior CT scan. Severe bilateral facet arthritis. Posterior decompression transverse dimension of the spinal canal has decreased because of hypertrophy of the facet joints and ligamentum flavum. L4-5: Increased spondylolisthesis from 6 mm to 8 mm. New broad-based soft disc protrusion with a calcified soft disc extrusion extending superiorly behind the body of L4 in the midline. Combined with the increased hypertrophy of the ligamentum flavum and facet joints the thecal sac is almost obliterated despite the posterior decompression. Both lateral recesses are severely compressed, much worse than on the prior exam. There is enhancement in and around the  thecal sac after contrast administration. L5-S1: Scarring in the thecal sac calcifications consistent with arachnoiditis. The disc is normal. Moderate hypertrophy of the ligamentum flavum, slightly increased. IMPRESSION: 1. Increased spondylolisthesis and severe spinal stenosis and lateral recess stenosis despite posterior decompression. This is due to increased hypertrophy of the ligamentum flavum and facet joints, increased spondylolisthesis, a new broad-based soft disc protrusion and extrusion. Increased severe right foraminal stenosis. These findings should affect the right L4 nerve and all distal nerve roots. 2. 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 3. Chronic severe left foraminal stenosis at T11-12, T12-L1, L1-2, and L2-3. Electronically Signed   By: Lorriane Shire M.D.   On: 05/09/2015 17:41     A comprehensive review of systems was negative except for: Hematologic/lymphatic: positive for easy  bruising  Blood pressure 133/64, pulse 75, temperature 97.7 F (36.5 C), temperature source Oral, resp. rate 18, height 5\' 8"  (1.727 m), weight 89.177 kg (196 lb 9.6 oz), SpO2 96 %.  General appearance: alert, cooperative and appears stated age Head: Normocephalic, without obvious abnormality, atraumatic Neck: supple, symmetrical, trachea midline Resp: clear to auscultation bilaterally Cardio: regular rate and rhythm GI: non-tender Extremities: intact sensation and capillary refill all digits.  +epl/fpl/io.  right long with erythema and swelling.  ttp in erythematous areas.  no proximal streaking.  no volar tenderness.  able to flex finger limitied by swelling. Pulses: 2+ and symmetric Skin: Skin color, texture, turgor normal. No rashes or lesions Neurologic: Grossly normal Incision/Wound: none  Assessment/Plan Right long finger abscess.  Recommend incision and drainage in OR.  Risks, benefits, and alternatives of surgery were discussed and the patient agrees with the plan of care.   Maureen Delatte R 05/10/2015, 4:40 PM  Addendum (05/10/15): Xrays of right hand reviewed by myself.  Degenerative changes at multiple joints including cmc and STT joint as well as MP and IP joints.  Old fracture of distal radius with malunion.  No signs of osteomyelitis.

## 2015-05-10 NOTE — Anesthesia Procedure Notes (Signed)
Procedure Name: LMA Insertion Date/Time: 05/10/2015 5:17 PM Performed by: Baxter Flattery Pre-anesthesia Checklist: Patient identified, Emergency Drugs available, Suction available and Patient being monitored Patient Re-evaluated:Patient Re-evaluated prior to inductionOxygen Delivery Method: Circle System Utilized Preoxygenation: Pre-oxygenation with 100% oxygen Intubation Type: IV induction Ventilation: Mask ventilation without difficulty LMA: LMA inserted LMA Size: 5.0 Number of attempts: 1 Airway Equipment and Method: Bite block Placement Confirmation: positive ETCO2 and breath sounds checked- equal and bilateral Tube secured with: Tape Dental Injury: Teeth and Oropharynx as per pre-operative assessment

## 2015-05-11 ENCOUNTER — Encounter (HOSPITAL_BASED_OUTPATIENT_CLINIC_OR_DEPARTMENT_OTHER): Payer: Self-pay | Admitting: Orthopedic Surgery

## 2015-05-13 LAB — CULTURE, ROUTINE-ABSCESS

## 2015-05-15 LAB — ANAEROBIC CULTURE

## 2015-06-20 ENCOUNTER — Ambulatory Visit: Payer: Medicare Other | Admitting: Neurology

## 2015-07-06 ENCOUNTER — Encounter (HOSPITAL_COMMUNITY): Payer: Self-pay | Admitting: Emergency Medicine

## 2015-07-06 ENCOUNTER — Emergency Department (HOSPITAL_COMMUNITY): Payer: Medicare Other

## 2015-07-06 ENCOUNTER — Inpatient Hospital Stay (HOSPITAL_COMMUNITY)
Admission: EM | Admit: 2015-07-06 | Discharge: 2015-07-11 | DRG: 101 | Disposition: A | Discharge status: Home Health | Payer: Medicare Other | Attending: Internal Medicine | Admitting: Internal Medicine

## 2015-07-06 ENCOUNTER — Telehealth (HOSPITAL_BASED_OUTPATIENT_CLINIC_OR_DEPARTMENT_OTHER): Payer: Self-pay | Admitting: Emergency Medicine

## 2015-07-06 DIAGNOSIS — I472 Ventricular tachycardia: Secondary | ICD-10-CM | POA: Diagnosis present

## 2015-07-06 DIAGNOSIS — R4182 Altered mental status, unspecified: Secondary | ICD-10-CM | POA: Diagnosis present

## 2015-07-06 DIAGNOSIS — G9340 Encephalopathy, unspecified: Secondary | ICD-10-CM | POA: Diagnosis not present

## 2015-07-06 DIAGNOSIS — I10 Essential (primary) hypertension: Secondary | ICD-10-CM | POA: Diagnosis present

## 2015-07-06 DIAGNOSIS — D72829 Elevated white blood cell count, unspecified: Secondary | ICD-10-CM

## 2015-07-06 DIAGNOSIS — R569 Unspecified convulsions: Secondary | ICD-10-CM

## 2015-07-06 DIAGNOSIS — E872 Acidosis, unspecified: Secondary | ICD-10-CM

## 2015-07-06 DIAGNOSIS — K219 Gastro-esophageal reflux disease without esophagitis: Secondary | ICD-10-CM | POA: Diagnosis present

## 2015-07-06 DIAGNOSIS — E785 Hyperlipidemia, unspecified: Secondary | ICD-10-CM | POA: Diagnosis present

## 2015-07-06 DIAGNOSIS — Z87891 Personal history of nicotine dependence: Secondary | ICD-10-CM

## 2015-07-06 DIAGNOSIS — Z7982 Long term (current) use of aspirin: Secondary | ICD-10-CM

## 2015-07-06 DIAGNOSIS — N179 Acute kidney failure, unspecified: Secondary | ICD-10-CM | POA: Diagnosis not present

## 2015-07-06 DIAGNOSIS — M810 Age-related osteoporosis without current pathological fracture: Secondary | ICD-10-CM | POA: Diagnosis present

## 2015-07-06 DIAGNOSIS — G8929 Other chronic pain: Secondary | ICD-10-CM

## 2015-07-06 DIAGNOSIS — R739 Hyperglycemia, unspecified: Secondary | ICD-10-CM

## 2015-07-06 DIAGNOSIS — E039 Hypothyroidism, unspecified: Secondary | ICD-10-CM | POA: Diagnosis present

## 2015-07-06 DIAGNOSIS — Z9114 Patient's other noncompliance with medication regimen: Secondary | ICD-10-CM

## 2015-07-06 DIAGNOSIS — G40909 Epilepsy, unspecified, not intractable, without status epilepticus: Principal | ICD-10-CM | POA: Diagnosis present

## 2015-07-06 DIAGNOSIS — W19XXXA Unspecified fall, initial encounter: Secondary | ICD-10-CM | POA: Diagnosis present

## 2015-07-06 DIAGNOSIS — Z79899 Other long term (current) drug therapy: Secondary | ICD-10-CM

## 2015-07-06 DIAGNOSIS — Z7952 Long term (current) use of systemic steroids: Secondary | ICD-10-CM

## 2015-07-06 LAB — PROTIME-INR
INR: 1.06 (ref 0.00–1.49)
Prothrombin Time: 14 seconds (ref 11.6–15.2)

## 2015-07-06 LAB — COMPREHENSIVE METABOLIC PANEL
ALT: 38 U/L (ref 17–63)
AST: 49 U/L — ABNORMAL HIGH (ref 15–41)
Albumin: 3.9 g/dL (ref 3.5–5.0)
Alkaline Phosphatase: 60 U/L (ref 38–126)
Anion gap: 22 — ABNORMAL HIGH (ref 5–15)
BUN: 32 mg/dL — ABNORMAL HIGH (ref 6–20)
CO2: 16 mmol/L — ABNORMAL LOW (ref 22–32)
Calcium: 9.1 mg/dL (ref 8.9–10.3)
Chloride: 101 mmol/L (ref 101–111)
Creatinine, Ser: 1.89 mg/dL — ABNORMAL HIGH (ref 0.61–1.24)
GFR calc Af Amer: 37 mL/min — ABNORMAL LOW (ref >60)
GFR calc non Af Amer: 32 mL/min — ABNORMAL LOW (ref >60)
Glucose, Bld: 221 mg/dL — ABNORMAL HIGH (ref 65–99)
Potassium: 3.6 mmol/L (ref 3.5–5.1)
Sodium: 139 mmol/L (ref 135–145)
Total Bilirubin: 0.5 mg/dL (ref 0.3–1.2)
Total Protein: 6.3 g/dL — ABNORMAL LOW (ref 6.5–8.1)

## 2015-07-06 LAB — URINALYSIS, ROUTINE W REFLEX MICROSCOPIC
Bilirubin Urine: NEGATIVE
Glucose, UA: NEGATIVE mg/dL
Ketones, ur: NEGATIVE mg/dL
Leukocytes, UA: NEGATIVE
Nitrite: NEGATIVE
Protein, ur: NEGATIVE mg/dL
Specific Gravity, Urine: 1.016 (ref 1.005–1.030)
pH: 7 (ref 5.0–8.0)

## 2015-07-06 LAB — URINE MICROSCOPIC-ADD ON

## 2015-07-06 LAB — APTT: aPTT: 28 seconds (ref 24–37)

## 2015-07-06 LAB — BLOOD GAS, ARTERIAL
Acid-Base Excess: 1.9 mmol/L (ref 0.0–2.0)
Bicarbonate: 25.8 mEq/L — ABNORMAL HIGH (ref 20.0–24.0)
Drawn by: 227661
O2 Saturation: 95.7 %
Patient temperature: 98.6
TCO2: 27.1 mmol/L (ref 0–100)
pCO2 arterial: 39.8 mmHg (ref 35.0–45.0)
pH, Arterial: 7.428 (ref 7.350–7.450)
pO2, Arterial: 80.8 mmHg (ref 80.0–100.0)

## 2015-07-06 LAB — I-STAT CHEM 8, ED
BUN: 33 mg/dL — ABNORMAL HIGH (ref 6–20)
Calcium, Ion: 1.11 mmol/L — ABNORMAL LOW (ref 1.13–1.30)
Chloride: 101 mmol/L (ref 101–111)
Creatinine, Ser: 1.6 mg/dL — ABNORMAL HIGH (ref 0.61–1.24)
Glucose, Bld: 208 mg/dL — ABNORMAL HIGH (ref 65–99)
HCT: 41 % (ref 39.0–52.0)
Hemoglobin: 13.9 g/dL (ref 13.0–17.0)
Potassium: 3.6 mmol/L (ref 3.5–5.1)
Sodium: 141 mmol/L (ref 135–145)
TCO2: 20 mmol/L (ref 0–100)

## 2015-07-06 LAB — CBC WITH DIFFERENTIAL/PLATELET
Basophils Absolute: 0.1 10*3/uL (ref 0.0–0.1)
Basophils Relative: 1 %
Eosinophils Absolute: 0.1 10*3/uL (ref 0.0–0.7)
Eosinophils Relative: 1 %
HCT: 39.9 % (ref 39.0–52.0)
Hemoglobin: 12.5 g/dL — ABNORMAL LOW (ref 13.0–17.0)
Lymphocytes Relative: 24 %
Lymphs Abs: 3.3 10*3/uL (ref 0.7–4.0)
MCH: 30.9 pg (ref 26.0–34.0)
MCHC: 31.3 g/dL (ref 30.0–36.0)
MCV: 98.8 fL (ref 78.0–100.0)
Monocytes Absolute: 1.2 10*3/uL — ABNORMAL HIGH (ref 0.1–1.0)
Monocytes Relative: 9 %
Neutro Abs: 9.1 10*3/uL — ABNORMAL HIGH (ref 1.7–7.7)
Neutrophils Relative %: 65 %
Platelets: 260 10*3/uL (ref 150–400)
RBC: 4.04 MIL/uL — ABNORMAL LOW (ref 4.22–5.81)
RDW: 14.9 % (ref 11.5–15.5)
WBC: 13.8 10*3/uL — ABNORMAL HIGH (ref 4.0–10.5)

## 2015-07-06 LAB — PHOSPHORUS: Phosphorus: 3.2 mg/dL (ref 2.5–4.6)

## 2015-07-06 LAB — TSH: TSH: 2.477 u[IU]/mL (ref 0.350–4.500)

## 2015-07-06 LAB — I-STAT TROPONIN, ED: Troponin i, poc: 0.08 ng/mL (ref 0.00–0.08)

## 2015-07-06 LAB — CBG MONITORING, ED: Glucose-Capillary: 188 mg/dL — ABNORMAL HIGH (ref 65–99)

## 2015-07-06 LAB — CK: Total CK: 275 U/L (ref 49–397)

## 2015-07-06 LAB — I-STAT CG4 LACTIC ACID, ED
Lactic Acid, Venous: 3.63 mmol/L (ref 0.5–2.0)
Lactic Acid, Venous: 1.5 mmol/L (ref 0.5–2.0)

## 2015-07-06 LAB — ETHANOL: Alcohol, Ethyl (B): <5 mg/dL (ref <5)

## 2015-07-06 LAB — MAGNESIUM: Magnesium: 2.1 mg/dL (ref 1.7–2.4)

## 2015-07-06 MED ORDER — GABAPENTIN 300 MG PO CAPS
300.0000 mg | ORAL_CAPSULE | Freq: Two times a day (BID) | ORAL | Status: DC
  Administered 2015-07-06 – 2015-07-11 (×10): 300 mg via ORAL
  Filled 2015-07-06 (×10): qty 1

## 2015-07-06 MED ORDER — PREDNISONE 5 MG PO TABS
10.0000 mg | ORAL_TABLET | Freq: Every day | ORAL | Status: DC
  Administered 2015-07-07 – 2015-07-11 (×5): 10 mg via ORAL
  Filled 2015-07-06 (×5): qty 2

## 2015-07-06 MED ORDER — SODIUM CHLORIDE 0.9 % IV SOLN: 75.0000 mL/h | INTRAVENOUS | Status: DC

## 2015-07-06 MED ORDER — EZETIMIBE 10 MG PO TABS
5.0000 mg | ORAL_TABLET | Freq: Every day | ORAL | Status: DC
  Administered 2015-07-06 – 2015-07-10 (×5): 5 mg via ORAL
  Filled 2015-07-06 (×5): qty 1

## 2015-07-06 MED ORDER — ENOXAPARIN SODIUM 40 MG/0.4ML ~~LOC~~ SOLN
40.0000 mg | SUBCUTANEOUS | Status: DC
  Administered 2015-07-07 – 2015-07-11 (×5): 40 mg via SUBCUTANEOUS
  Filled 2015-07-06 (×6): qty 0.4

## 2015-07-06 MED ORDER — ATENOLOL 25 MG PO TABS
25.0000 mg | ORAL_TABLET | Freq: Every day | ORAL | Status: DC
  Administered 2015-07-07 – 2015-07-11 (×5): 25 mg via ORAL
  Filled 2015-07-06 (×5): qty 1

## 2015-07-06 MED ORDER — ASPIRIN EC 81 MG PO TBEC
81.0000 mg | DELAYED_RELEASE_TABLET | Freq: Every day | ORAL | Status: DC
  Administered 2015-07-06 – 2015-07-10 (×5): 81 mg via ORAL
  Filled 2015-07-06 (×5): qty 1

## 2015-07-06 MED ORDER — MORPHINE SULFATE (PF) 2 MG/ML IV SOLN
2.0000 mg | Freq: Four times a day (QID) | INTRAVENOUS | Status: DC | PRN
  Administered 2015-07-08 – 2015-07-09 (×4): 2 mg via INTRAVENOUS
  Filled 2015-07-06 (×6): qty 1

## 2015-07-06 MED ORDER — METHYLPREDNISOLONE SODIUM SUCC 40 MG IJ SOLR
20.0000 mg | Freq: Once | INTRAMUSCULAR | Status: DC
  Filled 2015-07-06 (×2): qty 1

## 2015-07-06 MED ORDER — LOSARTAN POTASSIUM 50 MG PO TABS
100.0000 mg | ORAL_TABLET | Freq: Every day | ORAL | Status: DC
  Administered 2015-07-07 – 2015-07-11 (×5): 100 mg via ORAL
  Filled 2015-07-06 (×5): qty 2

## 2015-07-06 MED ORDER — SODIUM CHLORIDE 0.9 % IV BOLUS (SEPSIS)
1000.0000 mL | Freq: Once | INTRAVENOUS | Status: AC
  Administered 2015-07-06: 1000 mL via INTRAVENOUS

## 2015-07-06 MED ORDER — ACETAMINOPHEN 325 MG PO TABS
650.0000 mg | ORAL_TABLET | Freq: Four times a day (QID) | ORAL | Status: DC | PRN
Start: 2015-07-06 — End: 2015-07-11

## 2015-07-06 MED ORDER — LORAZEPAM 2 MG/ML IJ SOLN
1.0000 mg | Freq: Once | INTRAMUSCULAR | Status: AC
  Administered 2015-07-06: 1 mg via INTRAVENOUS

## 2015-07-06 MED ORDER — AMLODIPINE BESYLATE 5 MG PO TABS
5.0000 mg | ORAL_TABLET | Freq: Every day | ORAL | Status: DC
  Administered 2015-07-07 – 2015-07-11 (×5): 5 mg via ORAL
  Filled 2015-07-06 (×5): qty 1

## 2015-07-06 MED ORDER — SODIUM CHLORIDE 0.9 % IV SOLN
1000.0000 mg | Freq: Once | INTRAVENOUS | Status: AC
  Administered 2015-07-06: 1000 mg via INTRAVENOUS
  Filled 2015-07-06: qty 10

## 2015-07-06 MED ORDER — SULINDAC 200 MG PO TABS
200.0000 mg | ORAL_TABLET | Freq: Two times a day (BID) | ORAL | Status: DC
  Administered 2015-07-07 – 2015-07-11 (×9): 200 mg via ORAL
  Filled 2015-07-06 (×11): qty 1

## 2015-07-06 MED ORDER — SODIUM CHLORIDE 0.9% FLUSH
3.0000 mL | Freq: Two times a day (BID) | INTRAVENOUS | Status: DC
  Administered 2015-07-06 – 2015-07-11 (×6): 3 mL via INTRAVENOUS

## 2015-07-06 MED ORDER — LEVOTHYROXINE SODIUM 150 MCG PO TABS
150.0000 ug | ORAL_TABLET | Freq: Every day | ORAL | Status: DC
  Administered 2015-07-07 – 2015-07-11 (×5): 150 ug via ORAL
  Filled 2015-07-06 (×2): qty 2
  Filled 2015-07-06: qty 1
  Filled 2015-07-06: qty 2
  Filled 2015-07-06: qty 1
  Filled 2015-07-06: qty 2
  Filled 2015-07-06 (×2): qty 1
  Filled 2015-07-06: qty 2
  Filled 2015-07-06: qty 1

## 2015-07-06 MED ORDER — ACETAMINOPHEN 650 MG RE SUPP: 650.0000 mg | Freq: Four times a day (QID) | RECTAL | Status: DC | PRN

## 2015-07-06 MED ORDER — SODIUM CHLORIDE 0.9 % IV SOLN: INTRAVENOUS | Status: DC

## 2015-07-06 MED ORDER — POTASSIUM CHLORIDE 10 MEQ/100ML IV SOLN: 10.0000 meq | INTRAVENOUS | Status: AC

## 2015-07-06 MED ORDER — MORPHINE SULFATE (PF) 4 MG/ML IV SOLN
4.0000 mg | Freq: Once | INTRAVENOUS | Status: DC
  Filled 2015-07-06 (×3): qty 1

## 2015-07-06 MED ORDER — ONDANSETRON HCL 4 MG PO TABS: 4.0000 mg | ORAL_TABLET | Freq: Four times a day (QID) | ORAL | Status: DC | PRN

## 2015-07-06 MED ORDER — LORAZEPAM 2 MG/ML IJ SOLN: 1.0000 mg | INTRAMUSCULAR | Status: DC | PRN

## 2015-07-06 MED ORDER — PRAVASTATIN SODIUM 40 MG PO TABS
40.0000 mg | ORAL_TABLET | Freq: Every day | ORAL | Status: DC
  Administered 2015-07-07 – 2015-07-11 (×5): 40 mg via ORAL
  Filled 2015-07-06 (×5): qty 1

## 2015-07-06 MED ORDER — HYDROCHLOROTHIAZIDE 25 MG PO TABS
25.0000 mg | ORAL_TABLET | Freq: Every day | ORAL | Status: DC
  Administered 2015-07-07: 25 mg via ORAL
  Filled 2015-07-06: qty 1

## 2015-07-06 MED ORDER — POLYETHYLENE GLYCOL 3350 17 G PO PACK
8.5000 g | PACK | Freq: Every day | ORAL | Status: DC | PRN
  Administered 2015-07-08: 8.5 g via ORAL
  Filled 2015-07-06: qty 1

## 2015-07-06 MED ORDER — SODIUM CHLORIDE 0.45 % IV SOLN
INTRAVENOUS | Status: DC
  Administered 2015-07-06 – 2015-07-08 (×2): via INTRAVENOUS

## 2015-07-06 MED ORDER — ONDANSETRON HCL 4 MG/2ML IJ SOLN: 4.0000 mg | Freq: Four times a day (QID) | INTRAMUSCULAR | Status: DC | PRN

## 2015-07-06 MED ORDER — LABETALOL HCL 5 MG/ML IV SOLN
10.0000 mg | INTRAVENOUS | Status: DC | PRN
Start: 2015-07-06 — End: 2015-07-11

## 2015-07-06 MED ORDER — LORAZEPAM 2 MG/ML IJ SOLN
2.0000 mg | Freq: Once | INTRAMUSCULAR | Status: AC
  Administered 2015-07-06: 2 mg via INTRAMUSCULAR

## 2015-07-06 MED ORDER — LORAZEPAM 2 MG/ML IJ SOLN
INTRAMUSCULAR | Status: AC
  Filled 2015-07-06: qty 1

## 2015-07-06 MED ORDER — IOPAMIDOL (ISOVUE-300) INJECTION 61%
INTRAVENOUS | Status: AC
  Administered 2015-07-06: 80 mL
  Filled 2015-07-06: qty 100

## 2015-07-06 MED ORDER — SODIUM CHLORIDE 0.9 % IV SOLN
500.0000 mg | Freq: Two times a day (BID) | INTRAVENOUS | Status: DC
  Administered 2015-07-06 – 2015-07-07 (×2): 500 mg via INTRAVENOUS
  Filled 2015-07-06 (×4): qty 5

## 2015-07-06 MED ORDER — LORAZEPAM 2 MG/ML IJ SOLN
2.0000 mg | Freq: Once | INTRAMUSCULAR | Status: AC
  Administered 2015-07-06: 2 mg via INTRAMUSCULAR
  Filled 2015-07-06: qty 1

## 2015-07-06 MED ORDER — MORPHINE SULFATE (PF) 4 MG/ML IV SOLN
4.0000 mg | INTRAVENOUS | Status: DC
  Administered 2015-07-06 – 2015-07-07 (×5): 4 mg via INTRAVENOUS
  Filled 2015-07-06 (×5): qty 1

## 2015-07-06 NOTE — Consult Note (Signed)
Admission H&P    Chief Complaint: Recurrent partial seizures.  HPI: [name] is an 82 y.o. male with a history of partial seizure disorder and noncompliance with treatment, hypertension, hyperlipidemia and hypothyroidism, brought to the ED following onset of confusion and a mechanical fall at home. Patient was noted to have inattentiveness with eyes deviated and lip smacking as well as twitching of his right upper extremity in route to the emergency room. He had subsequent episodes of inattentiveness and lacking as well as chewing motions while in the emergency room. CT scan of his head showed no acute intracranial abnormality. Patient was prescribed Keppra February 2017 and was taking 250 mg twice a day. He stopped this medication as well as cancel his follow-up neurology appointment. His wife indicates that he has frequent episodes of being unresponsive and staring lipsmacking, for which she has no memory. She thinks these spells were controlled during the period that he took [place]. He was given Ativan and subsequently a loading dose of Keppra in the ED.  Past Medical History  Diagnosis Date  . Hypothyroidism   . Vertigo     hx of  . Constipation     from medications taken  . GERD (gastroesophageal reflux disease)   . Arthritis   . Cancer ([place])     skin CA removed from back  . Hypercholesteremia   . Hypertension   . Bruises easily   . Complication of anesthesia     trouble waking up  . Osteoarthritis   . Chronic insomnia 03/30/2015  . Transient alteration of awareness 03/30/2015    Past Surgical History  Procedure Laterality Date  . Tonsillectomy    . Hernia repair    . Knee arthroplasty      right knee X 2; left knee once  . Laminectomy      X 6  . Back surgery    . Eye surgery      Bilateral Cataract surgery   . Cardiac catheterization      Stent X 1  . Coronary angioplasty    . Cervix surgery    . Posterior cervical fusion/foraminotomy  01/28/2012    Procedure:  POSTERIOR CERVICAL FUSION/FORAMINOTOMY LEVEL 3;  Surgeon: [name], MD;  Location: [place] [facility];  Service: Neurosurgery;  Laterality: Left;  Posterior Cervical Five-Thoracic One Fusion, Arthrodesis with LEFT Cervical Seven-thoracic One Laminectomy, Foraminotomy and Resection of Synovial Cyst  . Posterior cervical fusion/foraminotomy N/A 01/29/2013    Procedure: POSTERIOR CERVICAL FUSION/FORAMINOTOMY LEVEL 1 and C2-5 Posteriolateral Arthrodesis;  Surgeon: [name], MD;  Location: [place] [facility];  Service: Neurosurgery;  Laterality: N/A;  C2-C3 Laminectomy C2-C3 posterior cervical arthrodesis  . I&d extremity Right 05/10/2015    Procedure: IRRIGATION AND DEBRIDEMENT EXTREMITY;  Surgeon: Leanora Cover, MD;  Location: [place];  Service: Orthopedics;  Laterality: Right;    Family History  Problem Relation Age of Onset  . Hypertension Mother   . Cancer Mother   . Kidney failure Father   . Heart disease Father    Social History:  reports that he has quit smoking. He has never used smokeless tobacco. He reports that he drinks alcohol. He reports that he does not use illicit drugs.  Allergies:  Allergies  Allergen Reactions  . Demerol [Meperidine] Nausea And Vomiting    Medications: Patient's preadmission medications were reviewed by me.  ROS: History obtained from spouse  General ROS: History of chronic insomnia Psychological ROS: negative for - behavioral disorder,  hallucinations, memory difficulties, mood swings or suicidal ideation Ophthalmic ROS: negative for - blurry vision, double vision, eye pain or loss of vision ENT ROS: negative for - epistaxis, nasal discharge, oral lesions, sore throat, tinnitus or vertigo Allergy and Immunology ROS: negative for - hives or itchy/watery eyes Hematological and Lymphatic ROS: negative for - bleeding problems, bruising or swollen lymph nodes Endocrine ROS: negative for - galactorrhea, hair pattern changes,  polydipsia/polyuria or temperature intolerance Respiratory ROS: negative for - cough, hemoptysis, shortness of breath or wheezing Cardiovascular ROS: negative for - chest pain, dyspnea on exertion, edema or irregular heartbeat Gastrointestinal ROS: negative for - abdominal pain, diarrhea, hematemesis, nausea/vomiting or stool incontinence Genito-Urinary ROS: negative for - dysuria, hematuria, incontinence or urinary frequency/urgency Musculoskeletal ROS: Positive for chronic back pain, left foot drop following knee surgery with flexion deformity and ankle Neurological ROS: as noted in HPI Dermatological ROS: negative for rash and skin lesion changes  Physical Examination: Blood pressure 131/98, pulse 108, resp. rate 18, SpO2 96 %.  HEENT-  Normocephalic, no lesions, without obvious abnormality.  Normal external eye and conjunctiva.  Normal TM's bilaterally.  Normal auditory canals and external ears. Normal external nose, mucus membranes and septum.  Normal pharynx. Neck supple with no masses, nodes, nodules or enlargement. Cardiovascular - regular rate and rhythm, S1, S2 normal, no murmur, click, rub or gallop Lungs - chest clear, no wheezing, rales, normal symmetric air entry Abdomen - soft, non-tender; bowel sounds normal; no masses,  no organomegaly Extremities - no joint deformities, effusion, or inflammation and no edema, flexion contracture and right ankle noted  Neurologic Examination: Patient was obtunded and easily agitated with tactile stimulation. There were no verbal responses. He was in no acute distress. Pupils were equal and reacted normally to light. Extraocular movements were intact with oculocephalic maneuvers. Face was symmetrical with no focal weakness. Strength of extremities was normal and symmetrical throughout. Deep tendon reflexes were absent throughout. Plantar responses were flexor bilaterally.  Results for orders placed or performed during the hospital  encounter of 07/06/15 (from the past 48 hour(s))  CBC with Differential     Status: Abnormal   Collection Time: 07/06/15  4:45 AM  Result Value Ref Range   WBC 13.8 (H) 4.0 - 10.5 K/uL   RBC 4.04 (L) 4.22 - 5.81 MIL/uL   Hemoglobin 12.5 (L) 13.0 - 17.0 g/dL   HCT 39.9 39.0 - 52.0 %   MCV 98.8 78.0 - 100.0 fL   MCH 30.9 26.0 - 34.0 pg   MCHC 31.3 30.0 - 36.0 g/dL   RDW 14.9 11.5 - 15.5 %   Platelets 260 150 - 400 K/uL   Neutrophils Relative % 65 %   Neutro Abs 9.1 (H) 1.7 - 7.7 K/uL   Lymphocytes Relative 24 %   Lymphs Abs 3.3 0.7 - 4.0 K/uL   Monocytes Relative 9 %   Monocytes Absolute 1.2 (H) 0.1 - 1.0 K/uL   Eosinophils Relative 1 %   Eosinophils Absolute 0.1 0.0 - 0.7 K/uL   Basophils Relative 1 %   Basophils Absolute 0.1 0.0 - 0.1 K/uL  Comprehensive metabolic panel     Status: Abnormal   Collection Time: 07/06/15  4:45 AM  Result Value Ref Range   Sodium 139 135 - 145 mmol/L   Potassium 3.6 3.5 - 5.1 mmol/L   Chloride 101 101 - 111 mmol/L   CO2 16 (L) 22 - 32 mmol/L   Glucose, Bld 221 (H) 65 - 99 mg/dL  BUN 32 (H) 6 - 20 mg/dL   Creatinine, Ser 1.89 (H) 0.61 - 1.24 mg/dL   Calcium 9.1 8.9 - 10.3 mg/dL   Total Protein 6.3 (L) 6.5 - 8.1 g/dL   Albumin 3.9 3.5 - 5.0 g/dL   AST 49 (H) 15 - 41 U/L   ALT 38 17 - 63 U/L   Alkaline Phosphatase 60 38 - 126 U/L   Total Bilirubin 0.5 0.3 - 1.2 mg/dL   GFR calc non Af Amer 32 (L) >60 mL/min   GFR calc Af Amer 37 (L) >60 mL/min    Comment: (NOTE) The eGFR has been calculated using the CKD EPI equation. This calculation has not been validated in all clinical situations. eGFR's persistently <60 mL/min signify possible Chronic Kidney Disease.    Anion gap 22 (H) 5 - 15  APTT     Status: None   Collection Time: 07/06/15  4:45 AM  Result Value Ref Range   aPTT 28 24 - 37 seconds  Protime-INR     Status: None   Collection Time: 07/06/15  4:45 AM  Result Value Ref Range   Prothrombin Time 14.0 11.6 - 15.2 seconds   INR 1.06  0.00 - 1.49  Magnesium     Status: None   Collection Time: 07/06/15  4:45 AM  Result Value Ref Range   Magnesium 2.1 1.7 - 2.4 mg/dL  Ethanol     Status: None   Collection Time: 07/06/15  4:45 AM  Result Value Ref Range   Alcohol, Ethyl (B) <5 <5 mg/dL    Comment:        LOWEST DETECTABLE LIMIT FOR SERUM ALCOHOL IS 5 mg/dL FOR MEDICAL PURPOSES ONLY   I-stat troponin, ED     Status: None   Collection Time: 07/06/15  4:50 AM  Result Value Ref Range   Troponin i, poc 0.08 0.00 - 0.08 ng/mL   Comment 3            Comment: Due to the release kinetics of cTnI, a negative result within the first hours of the onset of symptoms does not rule out myocardial infarction with certainty. If myocardial infarction is still suspected, repeat the test at appropriate intervals.   I-Stat Chem 8, ED     Status: Abnormal   Collection Time: 07/06/15  5:00 AM  Result Value Ref Range   Sodium 141 135 - 145 mmol/L   Potassium 3.6 3.5 - 5.1 mmol/L   Chloride 101 101 - 111 mmol/L   BUN 33 (H) 6 - 20 mg/dL   Creatinine, Ser 1.60 (H) 0.61 - 1.24 mg/dL   Glucose, Bld 208 (H) 65 - 99 mg/dL   Calcium, Ion 1.11 (L) 1.13 - 1.30 mmol/L   TCO2 20 0 - 100 mmol/L   Hemoglobin 13.9 13.0 - 17.0 g/dL   HCT 41.0 39.0 - 52.0 %  CBG monitoring, ED     Status: Abnormal   Collection Time: 07/06/15  5:17 AM  Result Value Ref Range   Glucose-Capillary 188 (H) 65 - 99 mg/dL  I-Stat CG4 Lactic Acid, ED     Status: Abnormal   Collection Time: 07/06/15  6:26 AM  Result Value Ref Range   Lactic Acid, Venous 3.63 (HH) 0.5 - 2.0 mmol/L   Ct Head Wo Contrast  07/06/2015  CLINICAL DATA:  Fall.  Unresponsive. EXAM: CT HEAD WITHOUT CONTRAST CT CERVICAL SPINE WITHOUT CONTRAST TECHNIQUE: Multidetector CT imaging of the head and cervical spine was performed following the  standard protocol without intravenous contrast. Multiplanar CT image reconstructions of the cervical spine were also generated. COMPARISON:  01/31/2015 head CT  FINDINGS: CT HEAD FINDINGS Skull and Sinuses:Negative for fracture or destructive process. Nasal trumpet in place. The visualized mastoids, middle ears, and imaged paranasal sinuses are clear. Visualized orbits: Bilateral cataract resection.  No acute finding. Brain: No evidence of acute infarction, hemorrhage, hydrocephalus, or mass lesion/mass effect. Mild for age periventricular microvascular ischemic change. No cortical finding to explain seizure. CT CERVICAL SPINE FINDINGS Advanced disc and facet degeneration with posterior fixation at C2-3 and from C5-T1. There has been ACDF with ventral plate from O1-Y0. Bony fusion appears solid at the operative levels. The C2 screw tips are in the lateral canal, chronic given there is no signs of hardware fracture or displacement. There is dextroscoliosis and multilevel listhesis effusion. No gross canal stenosis or hematoma. No prevertebral edema or acute fracture. There is remote T2 spinous process fracture Advanced adjacent segment disc degeneration at C7-T1 with complete disc loss. There is left foramen T1-T2 gas of doubtful clinical significance. IMPRESSION: 1. No acute intracranial or cervical spine finding. 2. 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 3. No cortical finding to explain seizure. Electronically Signed   By: Monte Fantasia M.D.   On: 07/06/2015 06:23   Ct Chest W Contrast  07/06/2015  CLINICAL DATA:  Fall.  Gross hematuria.  Unresponsive. EXAM: CT CHEST, ABDOMEN AND PELVIS WITHOUT CONTRAST TECHNIQUE: Multidetector CT imaging of the chest, abdomen and pelvis was performed following the standard protocol without IV contrast. COMPARISON:  01/31/2015 FINDINGS: CT CHEST FINDINGS THORACIC INLET/BODY WALL: No acute abnormality. MEDIASTINUM: Normal heart size. No pericardial effusion. No acute vascular abnormality. No adenopathy. Extensive atherosclerosis, including the coronary arteries. LUNG WINDOWS: No  contusion, hemothorax, or pneumothorax.  Dependent atelectasis. OSSEOUS: See below CT ABDOMEN AND PELVIS FINDINGS BODY WALL: Unremarkable. Hepatobiliary: No focal liver abnormality.Cholelithiasis. Pancreas: Fatty atrophy.  No acute finding. Spleen: Unremarkable. Adrenals/Urinary Tract: Negative adrenals. Bilateral renal cysts. 22 mm soft tissue density mass exophytic from the interpolar right kidney is indeterminate without precontrast imaging. Appearance is stable from prior. There is an additional 10 mm soft tissue density lesion in the left kidney, also stable. No hydronephrosis or stone. Unremarkable bladder- no clot seen within the urinary bladder. Reproductive:No pathologic findings. Stomach/Bowel: No evidence of injury. Colonic diverticulosis. Large stool volume without obstruction or impaction. Vascular/Lymphatic: Atherosclerosis. No acute vascular abnormality. No mass or adenopathy. Peritoneal: No ascites or pneumoperitoneum. Musculoskeletal: Negative for fracture. There are bilateral chronic rib fractures with callus. Severe lumbar spine degenerative disease status post decompressive laminectomy. There is calcified arachnoiditis at the operative lumbar levels. Lumbar dextroscoliosis from asymmetric disc narrowing and vertebral body wedging. There are chronic bilateral pars defects at L4 and L5 with grade 1 anterolisthesis at L4-5. IMPRESSION: 1. No acute finding. 2. Complex cysts or solid renal masses in the bilateral kidneys are stable from 2016, the larger on the right measuring 22 mm. If the patient is a treatment candidate, renal MRI could be used to differentiate. MRI would be most diagnostic when an outpatient at baseline. 3. Cholelithiasis. Electronically Signed   By: Monte Fantasia M.D.   On: 07/06/2015 06:43   Ct Cervical Spine Wo Contrast  07/06/2015  CLINICAL DATA:  Fall.  Unresponsive. EXAM: CT HEAD WITHOUT CONTRAST CT CERVICAL SPINE WITHOUT CONTRAST TECHNIQUE: Multidetector CT imaging of  the head and cervical spine was performed following the standard protocol without intravenous contrast. Multiplanar CT  image reconstructions of the cervical spine were also generated. COMPARISON:  01/31/2015 head CT FINDINGS: CT HEAD FINDINGS Skull and Sinuses:Negative for fracture or destructive process. Nasal trumpet in place. The visualized mastoids, middle ears, and imaged paranasal sinuses are clear. Visualized orbits: Bilateral cataract resection.  No acute finding. Brain: No evidence of acute infarction, hemorrhage, hydrocephalus, or mass lesion/mass effect. Mild for age periventricular microvascular ischemic change. No cortical finding to explain seizure. CT CERVICAL SPINE FINDINGS Advanced disc and facet degeneration with posterior fixation at C2-3 and from C5-T1. There has been ACDF with ventral plate from D4-Y8. Bony fusion appears solid at the operative levels. The C2 screw tips are in the lateral canal, chronic given there is no signs of hardware fracture or displacement. There is dextroscoliosis and multilevel listhesis effusion. No gross canal stenosis or hematoma. No prevertebral edema or acute fracture. There is remote T2 spinous process fracture Advanced adjacent segment disc degeneration at C7-T1 with complete disc loss. There is left foramen T1-T2 gas of doubtful clinical significance. IMPRESSION: 1. No acute intracranial or cervical spine finding. 2. 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 3. No cortical finding to explain seizure. Electronically Signed   By: Monte Fantasia M.D.   On: 07/06/2015 06:23   Ct Abdomen Pelvis W Contrast  07/06/2015  CLINICAL DATA:  Fall.  Gross hematuria.  Unresponsive. EXAM: CT CHEST, ABDOMEN AND PELVIS WITHOUT CONTRAST TECHNIQUE: Multidetector CT imaging of the chest, abdomen and pelvis was performed following the standard protocol without IV contrast. COMPARISON:  01/31/2015 FINDINGS: CT CHEST FINDINGS THORACIC  INLET/BODY WALL: No acute abnormality. MEDIASTINUM: Normal heart size. No pericardial effusion. No acute vascular abnormality. No adenopathy. Extensive atherosclerosis, including the coronary arteries. LUNG WINDOWS: No contusion, hemothorax, or pneumothorax.  Dependent atelectasis. OSSEOUS: See below CT ABDOMEN AND PELVIS FINDINGS BODY WALL: Unremarkable. Hepatobiliary: No focal liver abnormality.Cholelithiasis. Pancreas: Fatty atrophy.  No acute finding. Spleen: Unremarkable. Adrenals/Urinary Tract: Negative adrenals. Bilateral renal cysts. 22 mm soft tissue density mass exophytic from the interpolar right kidney is indeterminate without precontrast imaging. Appearance is stable from prior. There is an additional 10 mm soft tissue density lesion in the left kidney, also stable. No hydronephrosis or stone. Unremarkable bladder- no clot seen within the urinary bladder. Reproductive:No pathologic findings. Stomach/Bowel: No evidence of injury. Colonic diverticulosis. Large stool volume without obstruction or impaction. Vascular/Lymphatic: Atherosclerosis. No acute vascular abnormality. No mass or adenopathy. Peritoneal: No ascites or pneumoperitoneum. Musculoskeletal: Negative for fracture. There are bilateral chronic rib fractures with callus. Severe lumbar spine degenerative disease status post decompressive laminectomy. There is calcified arachnoiditis at the operative lumbar levels. Lumbar dextroscoliosis from asymmetric disc narrowing and vertebral body wedging. There are chronic bilateral pars defects at L4 and L5 with grade 1 anterolisthesis at L4-5. IMPRESSION: 1. No acute finding. 2. Complex cysts or solid renal masses in the bilateral kidneys are stable from 2016, the larger on the right measuring 22 mm. If the patient is a treatment candidate, renal MRI could be used to differentiate. MRI would be most diagnostic when an outpatient at baseline. 3. Cholelithiasis. Electronically Signed   By: Monte Fantasia  M.D.   On: 07/06/2015 06:43    Assessment/Plan 82 year old man with history of complex partial seizures and noncompliance with recommended treatment resenting with recurrent multiple seizures as well as postictal confusion.  Recommendations: 1. Continue Keppra 500 mg twice a day 2. No further neurodiagnostic studies are indicated at this point 3. EEG of mental status has not  returned to normal and 12-24 hrs. X  We will continue to follow this patient with you.  C.R. Nicole Kindred, MD [facility] [phone]  07/06/2015, 6:48 AM

## 2015-07-06 NOTE — Progress Notes (Signed)
Pt arrived to 5M20 via stretcher.  Pt drowsy, 2mg  ativan given prior to arrival, slightly combative.  Attempting to get out of bed.  Unable to obtain vitals.  Pt with incontinent episode, cleaned and placed in bed.  Wife at bedside.  IV  remains in L upper arm, wrapped.  Will continue to monitor. Cori Razor, RN

## 2015-07-06 NOTE — ED Notes (Signed)
Dr stewart at bedside

## 2015-07-06 NOTE — ED Notes (Signed)
Attempted to in and out cath pt for urine, blood noted to catheter and catheter removed.

## 2015-07-06 NOTE — ED Notes (Signed)
Wife at bedside-- very concerned that pt did not get pain meds on last admission- requesting that meds be given routinely as prescribed, every 4 hours instead of PRN. Pt had a bad experience last admission, without getting pain meds and has been on pain meds for a long time for multiple neck surgeries and chronic pain.

## 2015-07-06 NOTE — ED Notes (Signed)
Family at bedside. 

## 2015-07-06 NOTE — ED Notes (Signed)
Called to bedside by wife-- stating "he's squirting blood everywhere!" pt had taken out IV in right forearm, moderate amount of blood on floor and in bed, pt has taken off all electrodes and bp cuff, taken off gown. trying to get out of bed. Dr. Barbaraann Faster notified.

## 2015-07-06 NOTE — Progress Notes (Signed)
Pt sleepy, still having combative episodes, pulling covers off, and attempting to get out of bed.  Uncooperative with nurse assessment.  Wife at bedside and very appreciative.  Will continue to monitor. Cori Razor, RN

## 2015-07-06 NOTE — ED Notes (Signed)
Pt became combatitive in ct and then began to seize. Dr Leonides Schanz aware of same.

## 2015-07-06 NOTE — H&P (Signed)
History and Physical    [name] [id number] DOB: 1933-11-01 DOA: 07/06/2015  Referring MD/NP/PA: Dr Raliegh Ip. Ward - [facility] PCP: Donnajean Lopes, MD  Outpatient Specialists: Sherwood Gambler - Neurosurgery, Neurology - Juanda Crumble,  Patient coming from: Home  Chief Complaint: Seizure.  HPI: [name] is a 82 y.o. male with medical history significant of hypothyroidism, GERD, HLD, HTN, seizures who presents after having multiple seizures. Level V caveat applies as patient is somnolent and unable to provide history. History provided by patient's wife, EMS, EDP. Per report patient was found down this morning in his closet face down. He is making grunting noises and his wife found him unresponsive. EMS was immediately called and found the patient to be confused and somewhat lethargic. The only word patient can say that point time was okay. Blood glucose level in the field was 193. During transport EMS reports the patient had an additional seizure including rigidity and grinding of his teeth which lasted approximately 1 minute. This resolved without intervention. Patient had nasopharyngeal airway inserted. There was a reported 6 beat episode of V. tach en route. While in the ED undergoing workup patient had an additional seizure for which he received 2 mg of Ativan with resolution of the seizure. Patient has not had any further seizures. Patient has been in his normal state of health recently. Per patient's wife he has intermittent episodes of blank staring which can last several seconds to a minute. The cement ongoing for sometime. Patient also has baseline intermittent confusional state he seems to mix up dates times and places and people. There've been no recent complaints of fevers, chest pain, shortness breath, palpitations, neck stiffness, headache, dizziness, ill feeling, dysuria, frequency, back pain, rash.  ED Course: Patient with witness seizure in the ED. Given 2 mg of Ativan with resolution. IV  fluids started and neurology consult. CT imaging and labs performed.  Review of Systems: As per HPI otherwise unable to obtain further review systems due to patient's mental status.  Past Medical History  Diagnosis Date  . Hypothyroidism   . Vertigo     hx of  . Constipation     from medications taken  . GERD (gastroesophageal reflux disease)   . Arthritis   . Cancer ([place])     skin CA removed from back  . Hypercholesteremia   . Hypertension   . Bruises easily   . Complication of anesthesia     trouble waking up  . Osteoarthritis   . Chronic insomnia 03/30/2015  . Transient alteration of awareness 03/30/2015    Past Surgical History  Procedure Laterality Date  . Tonsillectomy    . Hernia repair    . Knee arthroplasty      right knee X 2; left knee once  . Laminectomy      X 6  . Back surgery    . Eye surgery      Bilateral Cataract surgery   . Cardiac catheterization      Stent X 1  . Coronary angioplasty    . Cervix surgery    . Posterior cervical fusion/foraminotomy  01/28/2012    Procedure: POSTERIOR CERVICAL FUSION/FORAMINOTOMY LEVEL 3;  Surgeon: [name], MD;  Location: [facility];  Service: Neurosurgery;  Laterality: Left;  Posterior Cervical Five-Thoracic One Fusion, Arthrodesis with LEFT Cervical Seven-thoracic One Laminectomy, Foraminotomy and Resection of Synovial Cyst  . Posterior cervical fusion/foraminotomy N/A 01/29/2013    Procedure: POSTERIOR CERVICAL FUSION/FORAMINOTOMY LEVEL 1 and C2-5 Posteriolateral Arthrodesis;  Surgeon: [name], MD;  Location: [facility];  Service: Neurosurgery;  Laterality: N/A;  C2-C3 Laminectomy C2-C3 posterior cervical arthrodesis  . I&d extremity Right 05/10/2015    Procedure: IRRIGATION AND DEBRIDEMENT EXTREMITY;  Surgeon: Leanora Cover, MD;  Location: [facility];  Service: Orthopedics;  Laterality: Right;     reports that he has quit smoking. He has never used smokeless tobacco. He reports  that he drinks alcohol. He reports that he does not use illicit drugs.  Allergies  Allergen Reactions  . Demerol [Meperidine] Nausea And Vomiting    Family History  Problem Relation Age of Onset  . Hypertension Mother   . Cancer Mother   . Kidney failure Father   . Heart disease Father     Prior to Admission medications   Medication Sig Start Date End Date Taking? Authorizing Provider  alendronate (FOSAMAX) 70 MG tablet Take 70 mg by mouth Once a week. Sundays 01/06/12  Yes Historical Provider, MD  amLODipine (NORVASC) 5 MG tablet Take 5 mg by mouth Daily.  12/23/11  Yes Historical Provider, MD  aspirin EC 81 MG tablet Take 81 mg by mouth at bedtime.   Yes Historical Provider, MD  atenolol (TENORMIN) 25 MG tablet Take 25 mg by mouth daily.   Yes Historical Provider, MD  calcium carbonate (OS-CAL) 600 MG TABS tablet Take 600 mg by mouth 2 (two) times daily with a meal.   Yes Historical Provider, MD  Cholecalciferol (VITAMIN D-3) 1000 units CAPS Take 1,000 Units by mouth 2 (two) times daily.   Yes Historical Provider, MD  Coenzyme Q10 (COQ10) 200 MG CAPS Take 200 mg by mouth at bedtime.   Yes Historical Provider, MD  cyanocobalamin 500 MCG tablet Take 500 mcg by mouth at bedtime.   Yes Historical Provider, MD  docusate sodium (COLACE) 100 MG capsule Take 200 mg by mouth 2 (two) times daily.    Yes Historical Provider, MD  gabapentin (NEURONTIN) 300 MG capsule Take 300 mg by mouth 2 (two) times daily.   Yes Historical Provider, MD  hydrochlorothiazide (HYDRODIURIL) 25 MG tablet Take 25 mg by mouth daily.   Yes Historical Provider, MD  HYDROcodone-acetaminophen (NORCO) 10-325 MG tablet Take 1 tablet by mouth every 4 (four) hours as needed for moderate pain.   Yes Historical Provider, MD  levothyroxine (SYNTHROID, LEVOTHROID) 150 MCG tablet Take 150 mcg by mouth daily before breakfast. 12/27/11  Yes Historical Provider, MD  losartan (COZAAR) 100 MG tablet Take 1 tablet (100 mg total) by  mouth daily. 02/08/15  Yes Reyne Dumas, MD  Multiple Vitamin (MULTIVITAMIN WITH MINERALS) TABS tablet Take 1 tablet by mouth at bedtime.   Yes Historical Provider, MD  nitroGLYCERIN (NITROSTAT) 0.4 MG SL tablet Place 0.4 mg under the tongue every 5 (five) minutes as needed for chest pain.   Yes Historical Provider, MD  omeprazole (PRILOSEC) 20 MG capsule Take 20 mg by mouth Daily. 12/23/11  Yes Historical Provider, MD  polyethylene glycol (MIRALAX / GLYCOLAX) packet Take 8.5 g by mouth daily as needed for mild constipation.   Yes Historical Provider, MD  pravastatin (PRAVACHOL) 40 MG tablet Take 40 mg by mouth daily. 10/29/14  Yes Historical Provider, MD  predniSONE (DELTASONE) 10 MG tablet Take 10 mg by mouth Daily.  01/06/12  Yes Historical Provider, MD  senna (SENOKOT) 8.6 MG tablet Take 1 tablet by mouth 2 (two) times daily.    Yes Historical Provider, MD  sulindac (CLINORIL) 200 MG tablet Take  200 mg by mouth 2 (two) times daily.   Yes Historical Provider, MD  ZETIA 10 MG tablet Take 5 mg by mouth at bedtime.  10/15/11  Yes Historical Provider, MD  doxycycline (VIBRAMYCIN) 50 MG capsule Take 2 capsules (100 mg total) by mouth 2 (two) times daily. Patient not taking: Reported on 07/06/2015 05/10/15   Leanora Cover, MD  HYDROcodone-acetaminophen [facility]) 5-325 MG tablet 1-2 tabs po q6 hours prn pain Patient not taking: Reported on 07/06/2015 05/10/15   Leanora Cover, MD  levETIRAcetam (KEPPRA) 250 MG tablet Take 1 tablet (250 mg total) by mouth 2 (two) times daily. Patient not taking: Reported on 07/06/2015 04/27/15   Kathrynn Ducking, MD    Physical Exam: Filed Vitals:   07/06/15 0615 07/06/15 0706 07/06/15 0759 07/06/15 0856  BP: 131/98  132/70   Pulse: 108  85 91  Temp:  98.6 F (37 C)    TempSrc:  Oral    Resp: 18  13 14   SpO2: 96%  97% 98%     Filed Vitals:   07/06/15 0615 07/06/15 0706 07/06/15 0759 07/06/15 0856  BP: 131/98  132/70   Pulse: 108  85 91  Temp:  98.6 F (37 C)      TempSrc:  Oral    Resp: 18  13 14   SpO2: 96%  97% 98%   Constitutional: Resting comfortably in bed, no acute distress  Eyes: lids and conjunctivae normal ENMT:  Mucous membranes are moist. nml dentition  Neck:  normal, supple, no masses, no thyromegaly Respiratory:  clear to auscultation bilaterally, no wheezing, no crackles. Normal respiratory effort. No accessory muscle use.  Cardiovascular:  Regular rate and rhythm, no murmurs / rubs / gallops. No extremity edema. 2+ pedal pulses. No carotid bruits.  Abdomen:  no tenderness, no masses palpated. No hepatosplenomegaly. Bowel sounds positive.  Musculoskeletal:  no clubbing / cyanosis. No joint deformity upper and lower extremities. Good ROM, no contractures. Normal muscle tone.  Skin: diffuse UE ecchymosis. No induration Neurologic/Psych:  Unable to fully ascertain neurologic and psychological status due to patient's obtunded state from multiple seizures and dosing of Ativan.   Labs on Admission: I have personally reviewed following labs and imaging studies  CBC:  Recent Labs Lab 07/06/15 0445 07/06/15 0500  WBC 13.8*  --   NEUTROABS 9.1*  --   HGB 12.5* 13.9  HCT 39.9 41.0  MCV 98.8  --   PLT 260  --    Basic Metabolic Panel:  Recent Labs Lab 07/06/15 0445 07/06/15 0500  NA 139 141  K 3.6 3.6  CL 101 101  CO2 16*  --   GLUCOSE 221* 208*  BUN 32* 33*  CREATININE 1.89* 1.60*  CALCIUM 9.1  --   MG 2.1  --    GFR: CrCl cannot be calculated (Unknown ideal weight.). Liver Function Tests:  Recent Labs Lab 07/06/15 0445  AST 49*  ALT 38  ALKPHOS 60  BILITOT 0.5  PROT 6.3*  ALBUMIN 3.9   No results for input(s): LIPASE, AMYLASE in the last 168 hours. No results for input(s): AMMONIA in the last 168 hours. Coagulation Profile:  Recent Labs Lab 07/06/15 0445  INR 1.06   Cardiac Enzymes:  Recent Labs Lab 07/06/15 0445  CKTOTAL 275   BNP (last 3 results) No results for input(s): PROBNP in the last  8760 hours. HbA1C: No results for input(s): HGBA1C in the last 72 hours. CBG:  Recent Labs Lab 07/06/15 0517  GLUCAP 188*  Lipid Profile: No results for input(s): CHOL, HDL, LDLCALC, TRIG, CHOLHDL, LDLDIRECT in the last 72 hours. Thyroid Function Tests: No results for input(s): TSH, T4TOTAL, FREET4, T3FREE, THYROIDAB in the last 72 hours. Anemia Panel: No results for input(s): VITAMINB12, FOLATE, FERRITIN, TIBC, IRON, RETICCTPCT in the last 72 hours. Urine analysis:    Component Value Date/Time   COLORURINE YELLOW 01/31/2015 [place] 01/31/2015 1542   LABSPEC 1.018 01/31/2015 1542   PHURINE 7.0 01/31/2015 1542   GLUCOSEU NEGATIVE 01/31/2015 1542   HGBUR NEGATIVE 01/31/2015 1542   BILIRUBINUR NEGATIVE 01/31/2015 1542   KETONESUR 15* 01/31/2015 1542   PROTEINUR NEGATIVE 01/31/2015 1542   UROBILINOGEN 1.0 09/16/2007 1212   NITRITE NEGATIVE 01/31/2015 1542   LEUKOCYTESUR NEGATIVE 01/31/2015 1542   Sepsis Labs: @LABRCNTIP (procalcitonin:4,lacticidven:4) )No results found for this or any previous visit (from the past 240 hour(s)).   Radiological Exams on Admission: Ct Head Wo Contrast  07/06/2015  CLINICAL DATA:  Fall.  Unresponsive. EXAM: CT HEAD WITHOUT CONTRAST CT CERVICAL SPINE WITHOUT CONTRAST TECHNIQUE: Multidetector CT imaging of the head and cervical spine was performed following the standard protocol without intravenous contrast. Multiplanar CT image reconstructions of the cervical spine were also generated. COMPARISON:  01/31/2015 head CT FINDINGS: CT HEAD FINDINGS Skull and Sinuses:Negative for fracture or destructive process. Nasal trumpet in place. The visualized mastoids, middle ears, and imaged paranasal sinuses are clear. Visualized orbits: Bilateral cataract resection.  No acute finding. Brain: No evidence of acute infarction, hemorrhage, hydrocephalus, or mass lesion/mass effect. Mild for age periventricular microvascular ischemic change. No cortical  finding to explain seizure. CT CERVICAL SPINE FINDINGS Advanced disc and facet degeneration with posterior fixation at C2-3 and from C5-T1. There has been ACDF with ventral plate from 624THL. Bony fusion appears solid at the operative levels. The C2 screw tips are in the lateral canal, chronic given there is no signs of hardware fracture or displacement. There is dextroscoliosis and multilevel listhesis effusion. No gross canal stenosis or hematoma. No prevertebral edema or acute fracture. There is remote T2 spinous process fracture Advanced adjacent segment disc degeneration at C7-T1 with complete disc loss. There is left foramen T1-T2 gas of doubtful clinical significance. IMPRESSION: 1. No acute intracranial or cervical spine finding. 2. 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 3. No cortical finding to explain seizure. Electronically Signed   By: Monte Fantasia M.D.   On: 07/06/2015 06:23   Ct Chest W Contrast  07/06/2015  CLINICAL DATA:  Fall.  Gross hematuria.  Unresponsive. EXAM: CT CHEST, ABDOMEN AND PELVIS WITHOUT CONTRAST TECHNIQUE: Multidetector CT imaging of the chest, abdomen and pelvis was performed following the standard protocol without IV contrast. COMPARISON:  01/31/2015 FINDINGS: CT CHEST FINDINGS THORACIC INLET/BODY WALL: No acute abnormality. MEDIASTINUM: Normal heart size. No pericardial effusion. No acute vascular abnormality. No adenopathy. Extensive atherosclerosis, including the coronary arteries. LUNG WINDOWS: No contusion, hemothorax, or pneumothorax.  Dependent atelectasis. OSSEOUS: See below CT ABDOMEN AND PELVIS FINDINGS BODY WALL: Unremarkable. Hepatobiliary: No focal liver abnormality.Cholelithiasis. Pancreas: Fatty atrophy.  No acute finding. Spleen: Unremarkable. Adrenals/Urinary Tract: Negative adrenals. Bilateral renal cysts. 22 mm soft tissue density mass exophytic from the interpolar right kidney is indeterminate without  precontrast imaging. Appearance is stable from prior. There is an additional 10 mm soft tissue density lesion in the left kidney, also stable. No hydronephrosis or stone. Unremarkable bladder- no clot seen within the urinary bladder. Reproductive:No pathologic findings. Stomach/Bowel: No evidence of injury. Colonic diverticulosis. Large  stool volume without obstruction or impaction. Vascular/Lymphatic: Atherosclerosis. No acute vascular abnormality. No mass or adenopathy. Peritoneal: No ascites or pneumoperitoneum. Musculoskeletal: Negative for fracture. There are bilateral chronic rib fractures with callus. Severe lumbar spine degenerative disease status post decompressive laminectomy. There is calcified arachnoiditis at the operative lumbar levels. Lumbar dextroscoliosis from asymmetric disc narrowing and vertebral body wedging. There are chronic bilateral pars defects at L4 and L5 with grade 1 anterolisthesis at L4-5. IMPRESSION: 1. No acute finding. 2. Complex cysts or solid renal masses in the bilateral kidneys are stable from 2016, the larger on the right measuring 22 mm. If the patient is a treatment candidate, renal MRI could be used to differentiate. MRI would be most diagnostic when an outpatient at baseline. 3. Cholelithiasis. Electronically Signed   By: Monte Fantasia M.D.   On: 07/06/2015 06:43   Ct Cervical Spine Wo Contrast  07/06/2015  CLINICAL DATA:  Fall.  Unresponsive. EXAM: CT HEAD WITHOUT CONTRAST CT CERVICAL SPINE WITHOUT CONTRAST TECHNIQUE: Multidetector CT imaging of the head and cervical spine was performed following the standard protocol without intravenous contrast. Multiplanar CT image reconstructions of the cervical spine were also generated. COMPARISON:  01/31/2015 head CT FINDINGS: CT HEAD FINDINGS Skull and Sinuses:Negative for fracture or destructive process. Nasal trumpet in place. The visualized mastoids, middle ears, and imaged paranasal sinuses are clear. Visualized orbits:  Bilateral cataract resection.  No acute finding. Brain: No evidence of acute infarction, hemorrhage, hydrocephalus, or mass lesion/mass effect. Mild for age periventricular microvascular ischemic change. No cortical finding to explain seizure. CT CERVICAL SPINE FINDINGS Advanced disc and facet degeneration with posterior fixation at C2-3 and from C5-T1. There has been ACDF with ventral plate from 624THL. Bony fusion appears solid at the operative levels. The C2 screw tips are in the lateral canal, chronic given there is no signs of hardware fracture or displacement. There is dextroscoliosis and multilevel listhesis effusion. No gross canal stenosis or hematoma. No prevertebral edema or acute fracture. There is remote T2 spinous process fracture Advanced adjacent segment disc degeneration at C7-T1 with complete disc loss. There is left foramen T1-T2 gas of doubtful clinical significance. IMPRESSION: 1. No acute intracranial or cervical spine finding. 2. 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 3. No cortical finding to explain seizure. Electronically Signed   By: Monte Fantasia M.D.   On: 07/06/2015 06:23   Ct Abdomen Pelvis W Contrast  07/06/2015  CLINICAL DATA:  Fall.  Gross hematuria.  Unresponsive. EXAM: CT CHEST, ABDOMEN AND PELVIS WITHOUT CONTRAST TECHNIQUE: Multidetector CT imaging of the chest, abdomen and pelvis was performed following the standard protocol without IV contrast. COMPARISON:  01/31/2015 FINDINGS: CT CHEST FINDINGS THORACIC INLET/BODY WALL: No acute abnormality. MEDIASTINUM: Normal heart size. No pericardial effusion. No acute vascular abnormality. No adenopathy. Extensive atherosclerosis, including the coronary arteries. LUNG WINDOWS: No contusion, hemothorax, or pneumothorax.  Dependent atelectasis. OSSEOUS: See below CT ABDOMEN AND PELVIS FINDINGS BODY WALL: Unremarkable. Hepatobiliary: No focal liver abnormality.Cholelithiasis. Pancreas: Fatty  atrophy.  No acute finding. Spleen: Unremarkable. Adrenals/Urinary Tract: Negative adrenals. Bilateral renal cysts. 22 mm soft tissue density mass exophytic from the interpolar right kidney is indeterminate without precontrast imaging. Appearance is stable from prior. There is an additional 10 mm soft tissue density lesion in the left kidney, also stable. No hydronephrosis or stone. Unremarkable bladder- no clot seen within the urinary bladder. Reproductive:No pathologic findings. Stomach/Bowel: No evidence of injury. Colonic diverticulosis. Large stool volume without obstruction or impaction. Vascular/Lymphatic:  Atherosclerosis. No acute vascular abnormality. No mass or adenopathy. Peritoneal: No ascites or pneumoperitoneum. Musculoskeletal: Negative for fracture. There are bilateral chronic rib fractures with callus. Severe lumbar spine degenerative disease status post decompressive laminectomy. There is calcified arachnoiditis at the operative lumbar levels. Lumbar dextroscoliosis from asymmetric disc narrowing and vertebral body wedging. There are chronic bilateral pars defects at L4 and L5 with grade 1 anterolisthesis at L4-5. IMPRESSION: 1. No acute finding. 2. Complex cysts or solid renal masses in the bilateral kidneys are stable from 2016, the larger on the right measuring 22 mm. If the patient is a treatment candidate, renal MRI could be used to differentiate. MRI would be most diagnostic when an outpatient at baseline. 3. Cholelithiasis. Electronically Signed   By: Monte Fantasia M.D.   On: 07/06/2015 06:43     Assessment/Plan Principal Problem:   Seizures ([place]) Active Problems:   Essential hypertension   Hypothyroidism   Acute encephalopathy   GERD (gastroesophageal reflux disease)   Acute kidney injury ([facility])   Hyperglycemia   Leukocytosis   Metabolic acidosis   Chronic pain    Seizure: pt w/ h/o Seizures. Was not taking Keppra due to side effects. From description of patient's  symptoms by wife sounds like patient has absence seizures on a regular basis. During this admission patient had 3 significant seizures. Patient currently somnolent after third seizure and Ativan and Keppra. AF VSS, lactic acid 3.6, CT head without evidence of acute abnormality. Metabolic acidosis w/ anion gap 22. Seen by neuro in the ED and will follow. - Tele - Continue Keppra until agent changed per Neuro - Szr protocol - Trend Lactic acid - CK  AKI: Cr 1.89 on admission. Baseline 1.15. Likely from hypoperfusion during szr and lactic acidosis - IVF - BMET in am  Hyperglycemia: likely from szr.  - A1c - CBGs  Confusion: intermittent at home. Sounds like pt w/ beginnings of dementia. Counseled to have outpt f/u and likely start Aricept/Namenda.  - f/u outpt  HTN: - continue atenolol, HCTZ, losartan, Norvasc in am - Labetalol IV prn  Chronic/neuropathic pain/inflammation: Patient unable to give history at this point time but per patient's wife patient has "chronic inflammation" which is why he takes his various pain medications and chronic steroids.  - contnue neurontin - Continue prednisone in am (IV dose of solumedrol x1 now) - Norco is scheduled so will give morphine equivalent scheduled until taking po.  - continue sulindac  GERD: - continue ppi iv  Osteoporosis: - hold fosamax and vit D   HLD: - contineu statin    DVT prophylaxis: Hep Code Status: FULL  Family Communication: WIfe  Disposition Plan: Pending improvement and workup  Consults called: Neuro   Admission status: Observation    Janes Colegrove J MD [facility]  If 7PM-7AM, please contact night-coverage [email or web address] Password [email or web address]  07/06/2015, 9:30 AM

## 2015-07-06 NOTE — Progress Notes (Signed)
Pt finally calm enough to get IV fluids and IV keppra dose.  Will continue to monitor. Cori Razor, RN

## 2015-07-06 NOTE — Progress Notes (Signed)
SLP Cancellation Note  Patient Details Name: [name] MRN: [id number] DOB: Jul 11, 1933   Cancelled treatment:       Reason Eval/Treat Not Completed: Medical issues which prohibited therapy. RN requested hold- pt has been agitated and is not able to participate with eval at this time. Will follow and complete swallow assessment as pt able to participate.    [place], [place] Pager [phone] 07/06/2015, 2:27 PM

## 2015-07-06 NOTE — ED Notes (Signed)
Pt gvien ativan in ct for seizure actiivty per verbal order dr ward, pt stopped seizing back in trauma c, pt not following commands at this time

## 2015-07-06 NOTE — ED Provider Notes (Addendum)
TIME SEEN: 4:40 AM  CHIEF COMPLAINT: Fall, seizure  HPI: Pt is a 82 y.o. male with history of hypertension, hyperlipidemia, hypothyroidism, possible history of previous seizure who presents to the emergency department after his wife heard him making noises and found him face down in her closet. He was unresponsive. Upon EMS arrival, patient was confused. The only word that he spoke was "okay" when asked him to get on the stretcher. Initially difficult to arouse. Blood glucose was 193. In the EMS truck, patient had what appeared to be a seizure. He began grinding his teeth, looking to the right and then posturing. Lasted approximately 1 minute and resolved without intervention. Patient currently has a nasopharyngeal airway in place and is on a non rebreather. He did have a 6 beat episode of ventricular tachycardia with EMS that they caught on a rhythm strip. Was incontinent of urine.  ROS: Unobtainable secondary to altered mental status  PAST MEDICAL HISTORY/PAST SURGICAL HISTORY:  Past Medical History  Diagnosis Date  . Hypothyroidism   . Vertigo     hx of  . Constipation     from medications taken  . GERD (gastroesophageal reflux disease)   . Arthritis   . Cancer ([place])     skin CA removed from back  . Hypercholesteremia   . Hypertension   . Bruises easily   . Complication of anesthesia     trouble waking up  . Osteoarthritis   . Chronic insomnia 03/30/2015  . Transient alteration of awareness 03/30/2015    MEDICATIONS:  Prior to Admission medications   Medication Sig Start Date End Date Taking? Authorizing Provider  alendronate (FOSAMAX) 70 MG tablet Take 70 mg by mouth Once a week. Sundays 01/06/12   Historical Provider, MD  amLODipine (NORVASC) 5 MG tablet Take 5 mg by mouth Daily.  12/23/11   Historical Provider, MD  aspirin EC 81 MG tablet Take 81 mg by mouth at bedtime.    Historical Provider, MD  atenolol (TENORMIN) 25 MG tablet Take 25 mg by mouth daily.    Historical  Provider, MD  diclofenac (VOLTAREN) 50 MG EC tablet Take 50 mg by mouth Twice daily. 11/09/11   Historical Provider, MD  docusate sodium (COLACE) 100 MG capsule Take 200 mg by mouth 2 (two) times daily.     Historical Provider, MD  doxycycline (VIBRAMYCIN) 50 MG capsule Take 2 capsules (100 mg total) by mouth 2 (two) times daily. 05/10/15   Leanora Cover, MD  fentaNYL (DURAGESIC - DOSED MCG/HR) 50 MCG/HR Place 50 mcg onto the skin every 3 (three) days.    Historical Provider, MD  gabapentin (NEURONTIN) 600 MG tablet Take 600 mg by mouth 3 (three) times daily.    Historical Provider, MD  HYDROcodone-acetaminophen [facility]) 5-325 MG tablet 1-2 tabs po q6 hours prn pain 05/10/15   Leanora Cover, MD  HYDROcodone-acetaminophen (NORCO/VICODIN) 5-325 MG tablet Take 1 tablet by mouth every 6 (six) hours as needed for moderate pain.    Historical Provider, MD  levETIRAcetam (KEPPRA) 250 MG tablet Take 1 tablet (250 mg total) by mouth 2 (two) times daily. 04/27/15   Kathrynn Ducking, MD  levothyroxine (SYNTHROID, LEVOTHROID) 150 MCG tablet Take 150 mcg by mouth daily before breakfast. 12/27/11   Historical Provider, MD  losartan (COZAAR) 100 MG tablet Take 1 tablet (100 mg total) by mouth daily. 02/08/15   Reyne Dumas, MD  nitroGLYCERIN (NITROSTAT) 0.4 MG SL tablet Place 0.4 mg under the tongue every 5 (five) minutes as  needed for chest pain.    Historical Provider, MD  omeprazole (PRILOSEC) 20 MG capsule Take 20 mg by mouth Daily. 12/23/11   Historical Provider, MD  polyethylene glycol powder (GLYCOLAX/MIRALAX) powder Take 17 g by mouth Once daily as needed. For constipation 11/02/11   Historical Provider, MD  pravastatin (PRAVACHOL) 40 MG tablet Take 40 mg by mouth daily. 10/29/14   Historical Provider, MD  predniSONE (DELTASONE) 10 MG tablet Take 10 mg by mouth Daily.  01/06/12   Historical Provider, MD  senna (SENOKOT) 8.6 MG tablet Take 1-2 tablets by mouth 2 (two) times daily. 2 tabs in the morning and 1 tab at  bedtime    Historical Provider, MD  ZETIA 10 MG tablet Take 5 mg by mouth at bedtime.  10/15/11   Historical Provider, MD    ALLERGIES:  Allergies  Allergen Reactions  . Demerol [Meperidine] Nausea And Vomiting    SOCIAL HISTORY:  Social History  Substance Use Topics  . Smoking status: Former Research scientist (life sciences)  . Smokeless tobacco: Never Used  . Alcohol Use: Yes     Comment: rare    FAMILY HISTORY: Family History  Problem Relation Age of Onset  . Hypertension Mother   . Cancer Mother   . Kidney failure Father   . Heart disease Father     EXAM: BP 122/86 mmHg  Pulse 87  Resp 29  SpO2 100% Oral temp is 98.6 CONSTITUTIONAL: Patient does open his eyes intermittently, groans and moves all 4 extremities, does not answer questions or follow commands, elderly HEAD: Normocephalic; atraumatic EYES: Conjunctivae clear, pupils are proximal with 3-4 mm and reactive bilaterally ENT: normal nose; no rhinorrhea; moist mucous membranes; pharynx without lesions noted; no dental injury; no septal hematoma NECK: Supple, no meningismus, no LAD; no midline spinal tenderness, step-off or deformity, cervical collar in place CARD: RRR; S1 and S2 appreciated; no murmurs, no clicks, no rubs, no gallops RESP: Normal chest excursion without splinting or tachypnea; breath sounds clear and equal bilaterally; no wheezes, no rhonchi, no rales; no hypoxia or respiratory distress CHEST:  chest wall stable, no crepitus or ecchymosis or deformity, nontender to palpation ABD/GI: Normal bowel sounds; non-distended; soft, non-tender, no rebound, no guarding PELVIS:  stable, nontender to palpation BACK:  The back appears normal and is non-tender to palpation, there is no CVA tenderness; no midline spinal tenderness, step-off or deformity EXT: Normal ROM in all joints; non-tender to palpation; no edema; normal capillary refill; no cyanosis, no bony tenderness or bony deformity of patient's extremities, no joint effusion, no  ecchymosis or lacerations    SKIN: Normal color for age and race; warm, multiple areas of ecchymosis to the bilateral upper extremities NEURO: Moves all extremities equally, does not follow commands or answer questions, will open his eyes intermittently  MEDICAL DECISION MAKING: Patient here with altered mental status, possible seizure with EMS. He had a 6 beat run of ventricular tachycardia of EMS. Was found down prior to the seizure by his wife. There is a note in our system from [facility] neurology where patient was previously on low-dose Keppra and had a negative EEG. Keppra is no longer listed on his medication list. It does not appear he is on any anticoagulation but does have multiple areas of bruising to his upper extremities that appear old. We'll obtain labs, urine, head and cervical spine CT, chest and pelvis x-rays. We'll give dose of IV Keppra in the emergency department.  ED PROGRESS: 5:00 AM  Staff reports patient  is more awake, combative. We'll give IV 1 mg Ativan. They report that he has gross hematuria without any trauma from catheterization. Because of this and patient is unresponsive and had a fall, will obtain CT of his chest, abdomen and pelvis as well to evaluate for any traumatic injuries.  5:15 AM  Pt had a another seizure while in CT scan. Generalized tonic-clonic. Received another 1 mg of IV Ativan after seizure had completed. Patient is having episodes of sinus on the monitor but no other arrhythmia. IV Keppra has been ordered.  5:40 AM  D/w Dr. Nicole Kindred with neurology who will see the patient in consult in the emergency department.   6:00 AM  Pt's wife is at bedside.  Patient is now more alert, grabbing the nonrebreather off of his face. Does not follow commands or answer questions however. Does have mild leukocytosis. Has mild elderly creatinine and a metabolic acidosis. Suspect that his lactate is elevated from seizure which is causing his metabolic acidosis but he does have  mild hyperglycemia and mild uremia. We'll check lactate level. Wife is also unclear how long he was down. Because of this we will obtain a CK level. Troponin is negative. Potassium 3.6, will give some IV replacement given ventricular tachycardia, sinus polyp. Magnesium normal. Wife reports that he is supposed to be in [place] twice a day which he took himself off this medication weeks ago. She states she has urged him to follow-up with neurology but he has canceled all of his appointments.  7:00 AM  Pt's CT scan showed no acute abnormality. His lactate is elevated which again would be typical after multiple seizures. He is improving clinically. Dr. Nicole Kindred has seen the patient and recommends admission to medicine and restarting his Keppra. He has received 1 g of IV Keppra in the emergency department. PCP is Dr. Bevelyn Buckles. We'll discuss with medicine for admission.   7:30 AM  Discussed patient's case with hospitalist, Dr. Marily Memos.  Recommend admission to observation, telemetry bed.  I will place holding orders per their request. Patient and family (if present) updated with plan. Care transferred to hospitalist service.  I reviewed all nursing notes, vitals, pertinent old records, EKGs, labs, imaging (as available).  7:40 AM  D/w Dr. Marily Memos patient's episodes of sinus pauses (no more than 5-6 seconds) and ventricular tachycardia that have been no more than 5-6 beat each. These episodes do not seem to be related to his seizures. They do not occur every time before he seizes and at times they occur and there is no seizure activity. Magnesium was 2.1. Potassium 3.6 which we will give 20 mEq IV potassium to replace to get his potassium above 4.0. Dr. Marily Memos will decide if cardiology needs to be involved. Troponin is negative. Patient's wife reports he has not been complaining of chest pain or shortness of breath recently. He is still postictal.    EKG Interpretation  Date/Time:  Wednesday July 06 2015 04:40:29 EDT Ventricular Rate:  100 PR Interval:  194 QRS Duration: 98 QT Interval:  389 QTC Calculation: 502 R Axis:   36 Text Interpretation:  Sinus tachycardia Sinus pause Prolonged QT interval No significant change since last tracing other than sinus pause Confirmed by WARD,  DO, KRISTEN [phone]) on 07/06/2015 4:54:02 AM         Delice Bison Ward, DO 07/06/15 [place], DO 07/06/15 [place], DO 07/06/15 [id number]

## 2015-07-06 NOTE — ED Notes (Signed)
Patient from home, found by wife face down in the closet.  He is unresponsive with EMS, combative later during transport and then had a seizure.  After the seizure he has 6 beats of VTACH.  Patient's mouth is clenched and grinding his teeth.  Patient is unresponsive upon arrival, incontinent of urine at this time.

## 2015-07-06 NOTE — Progress Notes (Signed)
Pt resting comfortably in bed.  Will continue to monitor. Cori Razor, RN

## 2015-07-07 ENCOUNTER — Ambulatory Visit: Payer: BLUE CROSS/BLUE SHIELD | Admitting: Neurology

## 2015-07-07 ENCOUNTER — Observation Stay (HOSPITAL_COMMUNITY)
Admit: 2015-07-07 | Discharge: 2015-07-07 | Disposition: A | Discharge status: Home / Self Care | Payer: Medicare Other | Attending: Internal Medicine | Admitting: Internal Medicine

## 2015-07-07 DIAGNOSIS — Z9114 Patient's other noncompliance with medication regimen: Secondary | ICD-10-CM | POA: Diagnosis not present

## 2015-07-07 DIAGNOSIS — R569 Unspecified convulsions: Secondary | ICD-10-CM | POA: Diagnosis present

## 2015-07-07 DIAGNOSIS — Z79899 Other long term (current) drug therapy: Secondary | ICD-10-CM | POA: Diagnosis not present

## 2015-07-07 DIAGNOSIS — E872 Acidosis: Secondary | ICD-10-CM | POA: Diagnosis present

## 2015-07-07 DIAGNOSIS — G8929 Other chronic pain: Secondary | ICD-10-CM | POA: Diagnosis present

## 2015-07-07 DIAGNOSIS — G40909 Epilepsy, unspecified, not intractable, without status epilepticus: Secondary | ICD-10-CM | POA: Diagnosis present

## 2015-07-07 DIAGNOSIS — I10 Essential (primary) hypertension: Secondary | ICD-10-CM | POA: Diagnosis present

## 2015-07-07 DIAGNOSIS — W19XXXA Unspecified fall, initial encounter: Secondary | ICD-10-CM | POA: Diagnosis present

## 2015-07-07 DIAGNOSIS — E785 Hyperlipidemia, unspecified: Secondary | ICD-10-CM | POA: Diagnosis present

## 2015-07-07 DIAGNOSIS — Z7952 Long term (current) use of systemic steroids: Secondary | ICD-10-CM | POA: Diagnosis not present

## 2015-07-07 DIAGNOSIS — E039 Hypothyroidism, unspecified: Secondary | ICD-10-CM | POA: Diagnosis present

## 2015-07-07 DIAGNOSIS — M810 Age-related osteoporosis without current pathological fracture: Secondary | ICD-10-CM | POA: Diagnosis present

## 2015-07-07 DIAGNOSIS — E038 Other specified hypothyroidism: Secondary | ICD-10-CM

## 2015-07-07 DIAGNOSIS — G9340 Encephalopathy, unspecified: Secondary | ICD-10-CM | POA: Diagnosis not present

## 2015-07-07 DIAGNOSIS — I472 Ventricular tachycardia: Secondary | ICD-10-CM | POA: Diagnosis present

## 2015-07-07 DIAGNOSIS — R739 Hyperglycemia, unspecified: Secondary | ICD-10-CM | POA: Diagnosis present

## 2015-07-07 DIAGNOSIS — K219 Gastro-esophageal reflux disease without esophagitis: Secondary | ICD-10-CM | POA: Diagnosis present

## 2015-07-07 DIAGNOSIS — Z87891 Personal history of nicotine dependence: Secondary | ICD-10-CM | POA: Diagnosis not present

## 2015-07-07 DIAGNOSIS — R4182 Altered mental status, unspecified: Secondary | ICD-10-CM | POA: Diagnosis present

## 2015-07-07 DIAGNOSIS — N179 Acute kidney failure, unspecified: Secondary | ICD-10-CM | POA: Diagnosis present

## 2015-07-07 DIAGNOSIS — Z7982 Long term (current) use of aspirin: Secondary | ICD-10-CM | POA: Diagnosis not present

## 2015-07-07 LAB — COMPREHENSIVE METABOLIC PANEL
ALT: 38 U/L (ref 17–63)
AST: 88 U/L — ABNORMAL HIGH (ref 15–41)
Albumin: 3.7 g/dL (ref 3.5–5.0)
Alkaline Phosphatase: 56 U/L (ref 38–126)
Anion gap: 12 (ref 5–15)
BUN: 18 mg/dL (ref 6–20)
CO2: 24 mmol/L (ref 22–32)
Calcium: 8.7 mg/dL — ABNORMAL LOW (ref 8.9–10.3)
Chloride: 105 mmol/L (ref 101–111)
Creatinine, Ser: 1.22 mg/dL (ref 0.61–1.24)
GFR calc Af Amer: >60 mL/min (ref >60)
GFR calc non Af Amer: 55 mL/min — ABNORMAL LOW (ref >60)
Glucose, Bld: 93 mg/dL (ref 65–99)
Potassium: 4.3 mmol/L (ref 3.5–5.1)
Sodium: 141 mmol/L (ref 135–145)
Total Bilirubin: 1.3 mg/dL — ABNORMAL HIGH (ref 0.3–1.2)
Total Protein: 5.9 g/dL — ABNORMAL LOW (ref 6.5–8.1)

## 2015-07-07 LAB — CBC
HCT: 39.2 % (ref 39.0–52.0)
Hemoglobin: 12.7 g/dL — ABNORMAL LOW (ref 13.0–17.0)
MCH: 30.6 pg (ref 26.0–34.0)
MCHC: 32.4 g/dL (ref 30.0–36.0)
MCV: 94.5 fL (ref 78.0–100.0)
Platelets: 195 10*3/uL (ref 150–400)
RBC: 4.15 MIL/uL — ABNORMAL LOW (ref 4.22–5.81)
RDW: 14.9 % (ref 11.5–15.5)
WBC: 9.1 10*3/uL (ref 4.0–10.5)

## 2015-07-07 LAB — GLUCOSE, CAPILLARY
Glucose-Capillary: 104 mg/dL — ABNORMAL HIGH (ref 65–99)
Glucose-Capillary: 57 mg/dL — ABNORMAL LOW (ref 65–99)
Glucose-Capillary: 67 mg/dL (ref 65–99)
Glucose-Capillary: 87 mg/dL (ref 65–99)
Glucose-Capillary: 90 mg/dL (ref 65–99)
Glucose-Capillary: 93 mg/dL (ref 65–99)

## 2015-07-07 LAB — HEMOGLOBIN A1C
Hgb A1c MFr Bld: 5.8 % — ABNORMAL HIGH (ref 4.8–5.6)
Mean Plasma Glucose: 120 mg/dL

## 2015-07-07 MED ORDER — DIVALPROEX SODIUM 500 MG PO DR TAB
750.0000 mg | DELAYED_RELEASE_TABLET | Freq: Two times a day (BID) | ORAL | Status: DC
  Administered 2015-07-07 – 2015-07-11 (×8): 750 mg via ORAL
  Filled 2015-07-07 (×10): qty 1

## 2015-07-07 MED ORDER — VALPROATE SODIUM 500 MG/5ML IV SOLN
1000.0000 mg | Freq: Once | INTRAVENOUS | Status: AC
  Administered 2015-07-07: 1000 mg via INTRAVENOUS
  Filled 2015-07-07: qty 10

## 2015-07-07 NOTE — Progress Notes (Signed)
Subjective: Patient has had no further episodes but is very somnolent.   Exam: Filed Vitals:   07/07/15 0124 07/07/15 0515  BP: 140/72 149/88  Pulse: 60 108  Temp: 98 F (36.7 C) 98 F (36.7 C)  Resp: 20 20     Gen: In bed, NAD MS: somnolent and falls asleep if not continuously stimulated. He continues to state --I am awake.  CN: PERRLA, EOMI, face symmetric and blinks to threat bilaterally Motor: MAEW Sensory: winces and withdraws from pain   Pertinent Labs: none  [name] PA-C [facility] [phone]  Impression: 82 year old man with history of complex partial seizures and noncompliance with recommended treatment presenting with recurrent multiple seizures as well as postictal confusion.   He has a history of delirium with hospitalization, and I suspect there is some degree of this going on now.   He did not tolerate keppra in the past, will change to depakote.   Recommendations: 1) EEG 2) d/c keppra and start depakote 750mg  bid.   Roland Rack, MD [facility] [phone]  If 7pm- 7am, please page neurology on call as listed in [place]. 07/07/2015, 9:42 AM

## 2015-07-07 NOTE — Progress Notes (Signed)
Pt restless during SLP evaluation but complete recommendation for D3/thin liquids tolerated medication with coaching. Therapist and wife at bedside. No noted distress. Will continue to monitor.

## 2015-07-07 NOTE — Evaluation (Addendum)
Clinical/Bedside Swallow Evaluation Patient Details  Name: [name] MRN: [id number] Date of Birth: 06/02/1933  Today's Date: 07/07/2015 Time: SLP Start Time (ACUTE ONLY): 0825 SLP Stop Time (ACUTE ONLY): 0900 SLP Time Calculation (min) (ACUTE ONLY): 35 min  Past Medical History:  Past Medical History  Diagnosis Date  . Hypothyroidism   . Vertigo     hx of  . Constipation     from medications taken  . GERD (gastroesophageal reflux disease)   . Arthritis   . Cancer ([place])     skin CA removed from back  . Hypercholesteremia   . Hypertension   . Bruises easily   . Complication of anesthesia     trouble waking up  . Osteoarthritis   . Chronic insomnia 03/30/2015  . Transient alteration of awareness 03/30/2015   Past Surgical History:  Past Surgical History  Procedure Laterality Date  . Tonsillectomy    . Hernia repair    . Knee arthroplasty      right knee X 2; left knee once  . Laminectomy      X 6  . Back surgery    . Eye surgery      Bilateral Cataract surgery   . Cardiac catheterization      Stent X 1  . Coronary angioplasty    . Cervix surgery    . Posterior cervical fusion/foraminotomy  01/28/2012    Procedure: POSTERIOR CERVICAL FUSION/FORAMINOTOMY LEVEL 3;  Surgeon: [name], MD;  Location: [place] [facility];  Service: Neurosurgery;  Laterality: Left;  Posterior Cervical Five-Thoracic One Fusion, Arthrodesis with LEFT Cervical Seven-thoracic One Laminectomy, Foraminotomy and Resection of Synovial Cyst  . Posterior cervical fusion/foraminotomy N/A 01/29/2013    Procedure: POSTERIOR CERVICAL FUSION/FORAMINOTOMY LEVEL 1 and C2-5 Posteriolateral Arthrodesis;  Surgeon: [name], MD;  Location: [place] [facility];  Service: Neurosurgery;  Laterality: N/A;  C2-C3 Laminectomy C2-C3 posterior cervical arthrodesis  . I&d extremity Right 05/10/2015    Procedure: IRRIGATION AND DEBRIDEMENT EXTREMITY;  Surgeon: Leanora Cover, MD;  Location: [place];   Service: Orthopedics;  Laterality: Right;   HPI:  pt is a 82 yo male adm to [facility] after being found down at home= possible seizure. Pt had vtach in transit and received nasopharyngeal airway.  PMH + for chronic back pain - s/p surgeries, HTN, HLD, seizure, C2-C5 fusion.   Swallow eval ordered.    Assessment / Plan / Recommendation Clinical Impression  pt presents with cognitive - ? pain based dysphagia resulting in decreased ability for pt to follow directions or focus attention to task.  He did accept ice chips, water, applesauce and graham cracker.  Delayed swallow noted with oral holding and pt required total cues to swallow.  No oral pocketing or indication of airway compromise.  Pt was observed to masticate pills provided by RN in applesauce.  Pt seen with minimal intake due to his lethargy.  Anticipate dysphagia to resolve with improved mental status.    Wife present and reports pt had dental work done 3 years ago and since then will on occasion bite his right tongue.  He has contusion on right frontal lingua currently.  Wife denies pt having dysphagia prior to admission.  Educated wife and pt to findings/recommendations.  Advised to allow pt to help hand over hand to improve neuro input for swallowing (if allowed given pt in mitts).      Of note, wife reports pt would chew pills at home prior to admission -  causing SLP to question cognitive based issue prior to this admit.        Aspiration Risk  Moderate aspiration risk (due to mentation)    Diet Recommendation Dysphagia 3 (Mech soft);Thin liquid   Liquid Administration via: Straw;Cup Medication Administration: Whole meds with puree Supervision: Staff to assist with self feeding Compensations: Slow rate;Small sips/bites Postural Changes: Remain upright for at least 30 minutes after po intake;Seated upright at 90 degrees    Other  Recommendations Oral Care Recommendations: Oral care BID   Follow up Recommendations       Frequency and  Duration min 1 x/week  1 week       Prognosis Prognosis for Safe Diet Advancement: Fair      Swallow Study   General Date of Onset: 07/07/15 HPI: pt is a 82 yo male adm to [facility] after being found down at home= possible seizure. Pt had vtach in transit and received nasopharyngeal airway.  PMH + for chronic back pain - s/p surgeries, HTN, HLD, seizure, C2-C5 fusion.   Swallow eval ordered.  Type of Study: Bedside Swallow Evaluation Diet Prior to this Study: NPO Respiratory Status: Room air History of Recent Intubation: No Behavior/Cognition: Lethargic/Drowsy;Distractible;Doesn't follow directions Oral Cavity Assessment: Dry Oral Care Completed by SLP: No Oral Cavity - Dentition: Adequate natural dentition Vision: Functional for self-feeding Self-Feeding Abilities: Needs assist Patient Positioning: Upright in bed Baseline Vocal Quality: Low vocal intensity Volitional Cough: Weak Volitional Swallow: Unable to elicit    Oral/Motor/Sensory Function Overall Oral Motor/Sensory Function: Generalized oral weakness (contusion on right lingua)   Ice Chips Ice chips: Within functional limits Presentation: Spoon   Thin Liquid Thin Liquid: Impaired Pharyngeal  Phase Impairments: Suspected delayed Swallow    Nectar Thick Nectar Thick Liquid: Not tested   Honey Thick Honey Thick Liquid: Not tested   Puree Puree: Impaired Presentation: Spoon Pharyngeal Phase Impairments: Suspected delayed Swallow   Solid   GO   Solid: Impaired Oral Phase Impairments: Poor awareness of bolus;Impaired mastication;Reduced lingual movement/coordination Oral Phase Functional Implications: Prolonged oral transit Pharyngeal Phase Impairments: Suspected delayed Swallow    Functional Assessment Tool Used: clinical judgement (clinical judgement) Functional Limitations: Swallowing Swallow Current Status [id number]): At least 60 percent but less than 80 percent impaired, limited or restricted Swallow Goal Status  [phone]): At least 20 percent but less than 40 percent impaired, limited or restricted   Claudie Fisherman, [facility] SLP [phone]

## 2015-07-07 NOTE — Procedures (Signed)
History: 82 year old male with a history of seizure disorder, persistent confusion  Sedation: None  Technique: This is a 21 channel routine scalp EEG performed at the bedside with bipolar and monopolar montages arranged in accordance to the international 10/20 system of electrode placement. One channel was dedicated to EKG recording.    Background: The background consists of predominantly of irregular delta and theta activities. With arousal, there is a posterior dominant rhythm that is poorly sustained. She is a maximal frequency of 9 Hz. He does have occasional epileptiform discharges which are often with a wide field, but predominantly the left hemisphere. Therefore localized however.  Photic stimulation: Physiologic driving is now performed  EEG Abnormalities: 1) poorly localized left hemispheric sharp waves with wide field 2) generalized irregular slow activity  Clinical Interpretation: This EEG is consistent with a potential area of epileptogenicity likely in the left hemisphere, but poorly localized by this study. This is consistent with the patient's known history of seizure disorder. No seizure was recorded.  Roland Rack, MD [facility] [phone]  If 7pm- 7am, please page neurology on call as listed in [place].

## 2015-07-07 NOTE — Progress Notes (Signed)
EEG completed, results pending. 

## 2015-07-07 NOTE — Progress Notes (Signed)
Patient ID: [name], male   DOB: [birth year], 82 y.o.   MRN: [id number]  PROGRESS NOTE    [name]  [id number] DOB: 24-Mar-1933 DOA: 07/06/2015  PCP: Donnajean Lopes, MD  Outpatient Specialists: Neurology, Dr. Margette Fast  Brief Narrative:  82 y.o. male with medical history significant for hypothyroidism, GERD, HLD, HTN, seizures who presented to [facility] after having multiple episodes of seizures. Because of altered mental status on the admission, patient's wife provided the history of present illness.  While in ED, patient had another seizure episode and received 2 mg of IV Ativan with resolution of the seizure. He has been seen by neurology in consultation. CT scans of the head and cervical spine did not show acute intracranial findings.  Assessment & Plan:   Principal Problem:   Seizures ([place]) - Likely secondary to noncompliance with Keppra. Patient's wife at the bedside reports patient is not taking Keppra but she is not sure for how long he has not been taking it. Apparently the side effects of the Keppra were too troublesome for patient for which reason he did not take it - Appreciate neurology following an their recommendations - Patient is currently on Keppra 500 mg IV every 12 hours, we'll follow-up with neurology if any change in medication - May continue gabapentin  - CT head and cervical spine did not show acute intracranial findings  Active Problems:   Essential hypertension - May continue amlodipine, losartan provided patient is more alert. He is sleeping at this time so okay to hold blood pressure medications if patient is not alert enough.    Hypothyroidism - Continue Synthroid if patient is alert enough to take this medication    Dyslipidemia - Continue Pravachol and zetia    Acute kidney injury  - Likely from HCTZ which was placed on hold  - Continue to monitor renal function daily    DVT prophylaxis: Lovenox subcutaneous Code  Status: full code  Family Communication: Wife at the bedside Disposition Plan: Home once stable, anticipate discharge by 07/09/2015   Consultants:   Neurology  Procedures:   None  Antimicrobials:   None   Subjective: Appears to be, per patient's wife this morning  Objective: Filed Vitals:   07/06/15 2227 07/07/15 0124 07/07/15 0515 07/07/15 0958  BP: 145/76 140/72 149/88 141/73  Pulse: 58 60 108 123  Temp: 97.6 F (36.4 C) 98 F (36.7 C) 98 F (36.7 C) 99.3 F (37.4 C)  TempSrc: Axillary Axillary Axillary Oral  Resp: 20 20 20 20   SpO2: 93% 96% 97% 96%    Intake/Output Summary (Last 24 hours) at 07/07/15 1047 Last data filed at 07/06/15 1746  Gross per 24 hour  Intake      0 ml  Output      2 ml  Net     -2 ml   There were no vitals filed for this visit.  Examination:  General exam: Appears calm and comfortable, has mittens on Respiratory system: Clear to auscultation. Respiratory effort normal. Cardiovascular system: S1 & S2 heard, RRR. No JVD, murmurs, rubs, gallops or clicks. No pedal edema. Gastrointestinal system: Abdomen is nondistended, soft and nontender. No organomegaly or masses felt. Normal bowel sounds heard. Central nervous system: Sleeping this am, no focal neurological deficits. Extremities: Discoloration over the right foot area of the toes, no edema Skin: Other than discoloration in the right foot area no other lesions or ulcers Psychiatry: Unable to test because of patient is sleeping  Data Reviewed: I have personally reviewed following labs and imaging studies  CBC:  Recent Labs Lab 07/06/15 0445 07/06/15 0500 07/07/15 0606  WBC 13.8*  --  9.1  NEUTROABS 9.1*  --   --   HGB 12.5* 13.9 12.7*  HCT 39.9 41.0 39.2  MCV 98.8  --  94.5  PLT 260  --  0000000   Basic Metabolic Panel:  Recent Labs Lab 07/06/15 0445 07/06/15 0500 07/06/15 1333 07/07/15 0606  NA 139 141  --  141  K 3.6 3.6  --  4.3  CL 101 101  --  105  CO2 16*  --    --  24  GLUCOSE 221* 208*  --  93  BUN 32* 33*  --  18  CREATININE 1.89* 1.60*  --  1.22  CALCIUM 9.1  --   --  8.7*  MG 2.1  --   --   --   PHOS  --   --  3.2  --    GFR: CrCl cannot be calculated (Unknown ideal weight.). Liver Function Tests:  Recent Labs Lab 07/06/15 0445 07/07/15 0606  AST 49* 88*  ALT 38 38  ALKPHOS 60 56  BILITOT 0.5 1.3*  PROT 6.3* 5.9*  ALBUMIN 3.9 3.7   No results for input(s): LIPASE, AMYLASE in the last 168 hours. No results for input(s): AMMONIA in the last 168 hours. Coagulation Profile:  Recent Labs Lab 07/06/15 0445  INR 1.06   Cardiac Enzymes:  Recent Labs Lab 07/06/15 0445  CKTOTAL 275   BNP (last 3 results) No results for input(s): PROBNP in the last 8760 hours. HbA1C:  Recent Labs  07/06/15 1333  HGBA1C 5.8*   CBG:  Recent Labs Lab 07/06/15 0517 07/06/15 2235 07/07/15 0029 07/07/15 0348 07/07/15 0729  GLUCAP 188* 57* 90 67 87   Lipid Profile: No results for input(s): CHOL, HDL, LDLCALC, TRIG, CHOLHDL, LDLDIRECT in the last 72 hours. Thyroid Function Tests:  Recent Labs  07/06/15 0101  TSH 2.477   Anemia Panel: No results for input(s): VITAMINB12, FOLATE, FERRITIN, TIBC, IRON, RETICCTPCT in the last 72 hours. Urine analysis:    Component Value Date/Time   COLORURINE YELLOW 07/06/2015 1421   APPEARANCEUR CLEAR 07/06/2015 1421   LABSPEC 1.016 07/06/2015 1421   PHURINE 7.0 07/06/2015 1421   GLUCOSEU NEGATIVE 07/06/2015 1421   HGBUR MODERATE* 07/06/2015 1421   BILIRUBINUR NEGATIVE 07/06/2015 1421   KETONESUR NEGATIVE 07/06/2015 1421   PROTEINUR NEGATIVE 07/06/2015 1421   UROBILINOGEN 1.0 09/16/2007 1212   NITRITE NEGATIVE 07/06/2015 1421   LEUKOCYTESUR NEGATIVE 07/06/2015 1421   Sepsis Labs: @LABRCNTIP (procalcitonin:4,lacticidven:4)   )No results found for this or any previous visit (from the past 240 hour(s)).    Radiology Studies: Ct Head Wo Contrast 07/06/2015  1. No acute intracranial or  cervical spine finding. 2. 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 3. No cortical finding to explain seizure.   Ct Chest W Contrast 07/06/2015 1. No acute finding. 2. Complex cysts or solid renal masses in the bilateral kidneys are stable from 2016, the larger on the right measuring 22 mm. If the patient is a treatment candidate, renal MRI could be used to differentiate. MRI would be most diagnostic when an outpatient at baseline. 3. Cholelithiasis.   Ct Cervical Spine Wo Contrast 07/06/2015  1. No acute intracranial or cervical spine finding. 2. 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 3. No cortical finding to explain seizure.  Ct Abdomen Pelvis W Contrast 07/06/2015  1. No acute finding. 2. Complex cysts or solid renal masses in the bilateral kidneys are stable from 2016, the larger on the right measuring 22 mm. If the patient is a treatment candidate, renal MRI could be used to differentiate. MRI would be most diagnostic when an outpatient at baseline. 3. Cholelithiasis.   Scheduled Meds: . amLODipine  5 mg Oral Daily  . aspirin EC  81 mg Oral QHS  . atenolol  25 mg Oral Daily  . enoxaparin (LOVENOX)   40 mg Subcutaneous Q24H  . ezetimibe  5 mg Oral QHS  . gabapentin  300 mg Oral BID  . hydrochlorothiazide  25 mg Oral Daily  . levETIRAcetam  500 mg Intravenous Q12H  . levothyroxine  150 mcg Oral QAC breakfast  . losartan  100 mg Oral Daily  . pravastatin  40 mg Oral Daily  . predniSONE  10 mg Oral Q breakfast  . sulindac  200 mg Oral BID   Continuous Infusions: . sodium chloride 75 mL/hr at 07/06/15 1820       Time spent: 25 minutes    Robert Lenz, MD [facility] Pager [phone]  If 7PM-7AM, please contact night-coverage [email or web address] Password [facility] 07/07/2015, 10:47 AM

## 2015-07-07 NOTE — Progress Notes (Signed)
Unable to administer am medication per NPO status. Pt due for SLP evaluation. Md paged to notify. Pt with no noted distress. Sitter and wife at bedside at this time. Call bell within reach. Will continue to monitor.

## 2015-07-08 DIAGNOSIS — E785 Hyperlipidemia, unspecified: Secondary | ICD-10-CM

## 2015-07-08 LAB — GLUCOSE, CAPILLARY
Glucose-Capillary: 74 mg/dL (ref 65–99)
Glucose-Capillary: 117 mg/dL — ABNORMAL HIGH (ref 65–99)
Glucose-Capillary: 119 mg/dL — ABNORMAL HIGH (ref 65–99)
Glucose-Capillary: 110 mg/dL — ABNORMAL HIGH (ref 65–99)

## 2015-07-08 MED ORDER — BISACODYL 10 MG RE SUPP
10.0000 mg | Freq: Every day | RECTAL | Status: DC | PRN
  Administered 2015-07-08: 10 mg via RECTAL
  Filled 2015-07-08: qty 1

## 2015-07-08 NOTE — Evaluation (Signed)
Physical Therapy Evaluation Patient Details Name: [name] MRN: [id number] DOB: [birth year] Today's Date: 07/08/2015   History of Present Illness  Patient is a 82 y/o male with hx of vertigo, HTN, hypothyroidism, complex seizures and OA presents after having multiple seizures.  Clinical Impression  Patient presents with generalized weakness, foot drop RLE, lethargy, impaired endurance and overall mobility s/p seizure. Tolerated gait training with Min guard assist for safety and chair follow. Pt falls asleep when not being stimulated. Wife concerned about taking pt home as she not able to assist as much due to broken arm. Lengthy discussion re problem solving regarding safety in the home. Will follow acutely to maximize independence and mobility prior to return home.    Follow Up Recommendations Home health PT;Supervision for mobility/OOB    Equipment Recommendations  None recommended by PT    Recommendations for Other Services OT consult     Precautions / Restrictions Precautions Precautions: Fall Restrictions Weight Bearing Restrictions: No      Mobility  Bed Mobility Overal bed mobility: Needs Assistance Bed Mobility: Supine to Sit     Supine to sit: Supervision;HOB elevated     General bed mobility comments: Cues for technique. Increased time to get to EOB.  Transfers Overall transfer level: Needs assistance Equipment used: Rolling walker (2 wheeled) Transfers: Sit to/from Stand Sit to Stand: Min guard         General transfer comment: Min guard for safety. Stood from [employer], from chair x2.  transferred to chair post ambulation bout.  Ambulation/Gait Ambulation/Gait assistance: Min guard Ambulation Distance (Feet): 125 Feet (x2 bouts) Assistive device: Rolling walker (2 wheeled) Gait Pattern/deviations: Steppage;Step-through pattern;Decreased stride length;Trunk flexed;Decreased dorsiflexion - right;Narrow base of support Gait velocity: decreased Gait  velocity interpretation: Below normal speed for age/gender General Gait Details: Pt with steppage gait RLE due to foot drop on the right. Pt has AFO but does not wear it. 1 seated rest break.  Stairs            Wheelchair Mobility    Modified Rankin (Stroke Patients Only)       Balance Overall balance assessment: Needs assistance Sitting-balance support: Feet supported;No upper extremity supported Sitting balance-Leahy Scale: Good Sitting balance - Comments: Able to reach outside BoS and adjust socks.   Standing balance support: During functional activity Standing balance-Leahy Scale: Poor Standing balance comment: Reliant on BUEs for support.                              Pertinent Vitals/Pain Pain Assessment: Faces Faces Pain Scale: Hurts even more Pain Location: "everywhere" Pain Descriptors / Indicators: Sore Pain Intervention(s): Monitored during session;Repositioned;Patient requesting pain meds-RN notified    Home Living Family/patient expects to be discharged to:: Private residence Living Arrangements: Spouse/significant other Available Help at Discharge: Family;Available 24 hours/day Type of Home: House Home Access: Stairs to enter   [employer] of Steps: 1 small stoop Home Layout: One level Home Equipment: Walker - 4 wheels;Cane - single point;Bedside commode;Shower seat;Walker - 2 wheels;Walker - standard;Grab bars - tub/shower (3 wheeled walker as well)      Prior Function Level of Independence: Independent with assistive device(s)         Comments: Used 3 wheeled rollator or 4 wheeled rollator. Drives. Wife broke her arm so is in a sling LUE.     Hand Dominance        Extremity/Trunk Assessment   Upper  Extremity Assessment: Defer to OT evaluation           Lower Extremity Assessment: LLE deficits/detail;RLE deficits/detail RLE Deficits / Details: 0/5 DF, 2+/5 knee extension/flexion.       Communication    Communication: No difficulties  Cognition Arousal/Alertness: Lethargic (falls asleep when not stimulated.) Behavior During Therapy: WFL for tasks assessed/performed Overall Cognitive Status: Within Functional Limits for tasks assessed (A&O x3.)                      General Comments General comments (skin integrity, edema, etc.): Wife present in room. Discussed in length home environment, safety within home, setup etc.     Exercises        Assessment/Plan    PT Assessment Patient needs continued PT services  PT Diagnosis Difficulty walking;Generalized weakness   PT Problem List Decreased strength;Decreased mobility;Decreased balance;Decreased activity tolerance;Decreased safety awareness;Pain;Impaired sensation;Decreased range of motion  PT Treatment Interventions Balance training;Gait training;Functional mobility training;Therapeutic activities;Therapeutic exercise;Patient/family education   PT Goals (Current goals can be found in the Care Plan section) Acute Rehab PT Goals Patient Stated Goal: to return home PT Goal Formulation: With patient Time For Goal Achievement: 07/22/15 Potential to Achieve Goals: Good    Frequency Min 3X/week   Barriers to discharge Decreased caregiver support spouse cannot provide much help due to broken arm    Co-evaluation               End of Session Equipment Utilized During Treatment: Gait belt Activity Tolerance: Patient limited by fatigue;Patient tolerated treatment well Patient left: in chair;with call bell/phone within reach;with family/visitor present Nurse Communication: Mobility status         Time: [id number] PT Time Calculation (min) (ACUTE ONLY): 47 min   Charges:   PT Evaluation $PT Eval Moderate Complexity: 1 Procedure PT Treatments $Gait Training: 8-22 mins $Self Care/Home Management: 8-22   PT G Codes:        Mckynlee Luse A Torrie Namba 07/08/2015, 3:01 PM Wray Kearns, [place], DPT [phone]

## 2015-07-08 NOTE — Care Management Note (Signed)
Case Management Note  Patient Details  Name: [name] MRN: [id number] Date of Birth: 1933-06-15  Subjective/Objective:  Patient is a 82 y/o male with hx of vertigo, HTN, hypothyroidism, complex seizures and OA admitted 07/06/2015 after having multiple seizures.                   Action/Plan: PT/OT evaluation recommending HHPT/OT.    Expected Discharge Date:                  Expected Discharge Plan:     In-House Referral:     Discharge planning Services  CM Consult  Post Acute Care Choice:  Home Health Choice offered to:     DME Arranged:    DME Agency:     HH Arranged:  RN, PT, OT, Nurse's Aide [place] Agency:     Status of Service:  In process, will continue to follow  Medicare Important Message Given:    Date Medicare IM Given:    Medicare IM give by:    Date Additional Medicare IM Given:    Additional Medicare Important Message give by:     If discussed at [place] of Stay Meetings, dates discussed:    Additional Comments:  Delrae Sawyers, RN 07/08/2015, 3:36 PM

## 2015-07-08 NOTE — Progress Notes (Signed)
Patient ID: [name], male   DOB: 30-May-1933, 82 y.o.   MRN: [id number]  PROGRESS NOTE    [name]  [id number] DOB: 02/24/1934 DOA: 07/06/2015  PCP: Donnajean Lopes, MD  Outpatient Specialists: Neurology, Dr. Margette Fast  Brief Narrative:  82 y.o. male with medical history significant for hypothyroidism, GERD, HLD, HTN, seizures who presented to [facility] after having multiple episodes of seizures. Because of altered mental status on the admission, patient's wife provided the history of present illness.  While in ED, patient had another seizure episode and received 2 mg of IV Ativan with resolution of the seizure. He has been seen by neurology in consultation. CT scans of the head and cervical spine did not show acute intracranial findings.  Assessment & Plan:   Principal Problem:   Seizures ([place]) - Likely secondary to noncompliance with Keppra.  - Per neurology, stopped keppra and pt now on Depakote 750 mg Q 12 hours - Appreciate neurology following  - May continue gabapentin  - CT head and cervical spine did not show acute intracranial findings - EEG done 4/27 showed potential area of epileptogenicity in left hemisphere but poorly localized. Known history of seizure disorder, no seizure recorded   Active Problems:   Essential hypertension - Continue Norvasc and atenolol - BP controlled, 128/62    Hypothyroidism - Continue Synthroid     Dyslipidemia - Continue Pravachol and zetia    Acute kidney injury  - Likely from HCTZ which was placed on hold  - Repeat Cr is WNL   DVT prophylaxis: Lovenox subcutaneous Code Status: full code  Family Communication: Wife at the bedside Disposition Plan: Home once stable, anticipate discharge by 07/09/2015, possible SNF    Consultants:   Neurology  PT  Procedures:   EEG 07/07/2015  Antimicrobials:   None   Subjective: No overnight events.   Objective: Filed Vitals:   07/07/15 1357  07/07/15 1701 07/07/15 2225 07/08/15 0156  BP: 124/76 130/81 141/79 128/62  Pulse: 97 91 90 88  Temp: 99.5 F (37.5 C) 98.1 F (36.7 C) 98.1 F (36.7 C) 98.2 F (36.8 C)  TempSrc: Oral Oral Oral Oral  Resp: 18 20 18 18   SpO2: 96% 100% 100% 100%    Intake/Output Summary (Last 24 hours) at 07/08/15 [id number] Last data filed at 07/07/15 1704  Gross per 24 hour  Intake      0 ml  Output    650 ml  Net   -650 ml   There were no vitals filed for this visit.  Examination:  General exam: Appears in no acute distress  Respiratory system: No wheezing, no rhonchi  Cardiovascular system: S1 & S2 (+), Rate controlled. No JVD. Gastrointestinal system: (+) BS, non tender  Central nervous system: Nonfocal Extremities: Discoloration over the right foot area of the toes, palpable pulses, no edema Skin: warm, dry Psychiatry: Normal mood an behavior   Data Reviewed: I have personally reviewed following labs and imaging studies  CBC:  Recent Labs Lab 07/06/15 0445 07/06/15 0500 07/07/15 0606  WBC 13.8*  --  9.1  NEUTROABS 9.1*  --   --   HGB 12.5* 13.9 12.7*  HCT 39.9 41.0 39.2  MCV 98.8  --  94.5  PLT 260  --  0000000   Basic Metabolic Panel:  Recent Labs Lab 07/06/15 0445 07/06/15 0500 07/06/15 1333 07/07/15 0606  NA 139 141  --  141  K 3.6 3.6  --  4.3  CL  101 101  --  105  CO2 16*  --   --  24  GLUCOSE 221* 208*  --  93  BUN 32* 33*  --  18  CREATININE 1.89* 1.60*  --  1.22  CALCIUM 9.1  --   --  8.7*  MG 2.1  --   --   --   PHOS  --   --  3.2  --    GFR: CrCl cannot be calculated (Unknown ideal weight.). Liver Function Tests:  Recent Labs Lab 07/06/15 0445 07/07/15 0606  AST 49* 88*  ALT 38 38  ALKPHOS 60 56  BILITOT 0.5 1.3*  PROT 6.3* 5.9*  ALBUMIN 3.9 3.7   No results for input(s): LIPASE, AMYLASE in the last 168 hours. No results for input(s): AMMONIA in the last 168 hours. Coagulation Profile:  Recent Labs Lab 07/06/15 0445  INR 1.06   Cardiac  Enzymes:  Recent Labs Lab 07/06/15 0445  CKTOTAL 275   BNP (last 3 results) No results for input(s): PROBNP in the last 8760 hours. HbA1C:  Recent Labs  07/06/15 1333  HGBA1C 5.8*   CBG:  Recent Labs Lab 07/07/15 0029 07/07/15 0348 07/07/15 0729 07/07/15 1140 07/07/15 1937  GLUCAP 90 67 87 93 104*   Lipid Profile: No results for input(s): CHOL, HDL, LDLCALC, TRIG, CHOLHDL, LDLDIRECT in the last 72 hours. Thyroid Function Tests:  Recent Labs  07/06/15 0101  TSH 2.477   Anemia Panel: No results for input(s): VITAMINB12, FOLATE, FERRITIN, TIBC, IRON, RETICCTPCT in the last 72 hours. Urine analysis:    Component Value Date/Time   COLORURINE YELLOW 07/06/2015 1421   APPEARANCEUR CLEAR 07/06/2015 1421   LABSPEC 1.016 07/06/2015 1421   PHURINE 7.0 07/06/2015 1421   GLUCOSEU NEGATIVE 07/06/2015 1421   HGBUR MODERATE* 07/06/2015 1421   BILIRUBINUR NEGATIVE 07/06/2015 1421   KETONESUR NEGATIVE 07/06/2015 1421   PROTEINUR NEGATIVE 07/06/2015 1421   UROBILINOGEN 1.0 09/16/2007 1212   NITRITE NEGATIVE 07/06/2015 1421   LEUKOCYTESUR NEGATIVE 07/06/2015 1421   Sepsis Labs: @LABRCNTIP (procalcitonin:4,lacticidven:4)   )No results found for this or any previous visit (from the past 240 hour(s)).    Radiology Studies: Ct Head Wo Contrast 07/06/2015  1. No acute intracranial or cervical spine finding. 2. 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 3. No cortical finding to explain seizure.   Ct Chest W Contrast 07/06/2015 1. No acute finding. 2. Complex cysts or solid renal masses in the bilateral kidneys are stable from 2016, the larger on the right measuring 22 mm. If the patient is a treatment candidate, renal MRI could be used to differentiate. MRI would be most diagnostic when an outpatient at baseline. 3. Cholelithiasis.   Ct Cervical Spine Wo Contrast 07/06/2015  1. No acute intracranial or cervical spine finding. 2.  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 3. No cortical finding to explain seizure.  Ct Abdomen Pelvis W Contrast 07/06/2015  1. No acute finding. 2. Complex cysts or solid renal masses in the bilateral kidneys are stable from 2016, the larger on the right measuring 22 mm. If the patient is a treatment candidate, renal MRI could be used to differentiate. MRI would be most diagnostic when an outpatient at baseline. 3. Cholelithiasis.   Scheduled Meds: . amLODipine  5 mg Oral Daily  . aspirin EC  81 mg Oral QHS  . atenolol  25 mg Oral Daily  . enoxaparin (LOVENOX)   40 mg Subcutaneous Q24H  .  ezetimibe  5 mg Oral QHS  . gabapentin  300 mg Oral BID  . hydrochlorothiazide  25 mg Oral Daily  . levETIRAcetam  500 mg Intravenous Q12H  . levothyroxine  150 mcg Oral QAC breakfast  . losartan  100 mg Oral Daily  . pravastatin  40 mg Oral Daily  . predniSONE  10 mg Oral Q breakfast  . sulindac  200 mg Oral BID   Continuous Infusions: . sodium chloride 75 mL/hr at 07/06/15 1820     LOS: 1 day   Time spent: 15 minutes    Leisa Lenz, MD [facility] Pager [phone]  If 7PM-7AM, please contact night-coverage [email or web address] Password [email or web address] 07/08/2015, 6:20 AM

## 2015-07-08 NOTE — Progress Notes (Addendum)
Subjective: Much improved. Fully alert and awake.    Exam: Filed Vitals:   07/08/15 0156 07/08/15 0600  BP: 128/62 122/69  Pulse: 88 87  Temp: 98.2 F (36.8 C) 98 F (36.7 C)  Resp: 18 18     Gen: In bed, NAD MS: very much awake and oriented to time, date, year, follows all commands.  CN: PERRLA, EOMI, face symmetric and blinks to threat bilaterally Motor: MAEW Sensory: intact   Pertinent Labs: EEG: Clinical Interpretation: This EEG is consistent with a potential area of epileptogenicity likely in the left hemisphere, but poorly localized by this study. This is consistent with the patient's known history of seizure disorder. No seizure was recorded.    Impression: 82 year old man with history of complex partial seizures and noncompliance with recommended treatment presenting with recurrent multiple seizures as well as postictal confusion.   He has a history of delirium with hospitalization however after changing to Depakote his mentation improve dramatically. I suspect his somnolence was due to Keppra.       Recommendations: 1) Continue depakote 750mg  bid at discharge. They are requesting to go to Dr. Verlee Monte neurology.   [name] PA-C [facility]   If 7pm- 7am, please page neurology on call as listed in [place]. 07/08/2015, 9:50 AM   I have seen and evaluated the patient. I have reviewed the above note and made appropriate changes.   Much improved today, appropriate conversation. He will need to continue AEDs, had side effects with gabapentin and keppra  1) hold keppra and gabapentin 2) continue depakote 3) I will request follow up with Dr. Delice Lesch at pt request. 4) neurology will sign off please call with further questions or concerns  Roland Rack, MD [facility] [phone]  If 7pm- 7am, please page neurology on call as listed in [place].

## 2015-07-08 NOTE — Progress Notes (Signed)
Speech Language Pathology Treatment: Dysphagia  Patient Details Name: [name] MRN: [id number] DOB: Jan 05, 1934 Today's Date: 07/08/2015 Time: [id number] SLP Time Calculation (min) (ACUTE ONLY): 20 min  Assessment / Plan / Recommendation Clinical Impression  Pt consumed regular/Dysphagia 3 (chopped) and thin liquids with verbal cues (modified independent) as he only needed cues for reflux/esophageal precautions d/t hx of GERD; pt independently using slow rate and small sips with intake of po's; no pain noted during mastication; no s/s of aspiration with any consistency; pt/wife stated he is able to take medications whole with liquids now and is requesting regular consistency foods; regular/thin diet recommended (heart healthy) with aspiration precautions discussed with wife/son and pt during po intake trial.  Pt was advised that if any consistency is too difficult to masticate to use judgment in consuming these consistencies (i.e.: harder breads, tough meats, etc.) and pt/family were in agreement.   HPI HPI: pt is a 82 yo male adm to [facility] after being found down at home= possible seizure. Pt had vtach in transit and received nasopharyngeal airway.  PMH + for chronic back pain - s/p surgeries, HTN, HLD, seizure, C2-C5 fusion.   Swallow eval ordered.       SLP Plan  Continue with current plan of care     Recommendations  Diet recommendations: Regular;Thin liquid Liquids provided via: Cup;Straw Medication Administration: Whole meds with liquid Supervision: Staff to assist with self feeding Compensations: Slow rate;Small sips/bites Postural Changes and/or Swallow Maneuvers: Seated upright 90 degrees;Upright 30-60 min after meal             Oral Care Recommendations: Oral care BID Plan: Continue with current plan of care                     ADAMS,PAT, M.S., CCC-SLP 07/08/2015, 11:06 AM

## 2015-07-08 NOTE — Care Management Note (Signed)
Case Management Note  Patient Details  Name: [name] MRN: [id number] Date of Birth: 1933-09-09  Subjective/Objective:                    Action/Plan: Patient was admitted with seizure. Lives at home alone. Will follow for discharge needs pending PT/OT evals and physician orders.  Expected Discharge Date:                  Expected Discharge Plan:     In-House Referral:     Discharge planning Services     Post Acute Care Choice:    Choice offered to:     DME Arranged:    DME Agency:     HH Arranged:    HH Agency:     Status of Service:  In process, will continue to follow  Medicare Important Message Given:    Date Medicare IM Given:    Medicare IM give by:    Date Additional Medicare IM Given:    Additional Medicare Important Message give by:     If discussed at [facility] of Stay Meetings, dates discussed:    Additional Comments:  Rolm Baptise, RN 07/08/2015, 10:33 AM [phone]

## 2015-07-09 MED ORDER — HYDROCODONE-ACETAMINOPHEN 5-325 MG PO TABS
1.0000 | ORAL_TABLET | ORAL | Status: DC | PRN
  Administered 2015-07-09 – 2015-07-11 (×5): 2 via ORAL
  Filled 2015-07-09 (×5): qty 2

## 2015-07-09 MED ORDER — DIVALPROEX SODIUM 250 MG PO DR TAB: 750.0000 mg | DELAYED_RELEASE_TABLET | Freq: Two times a day (BID) | ORAL | Status: DC

## 2015-07-09 NOTE — Evaluation (Addendum)
Occupational Therapy Evaluation Patient Details Name: [name] MRN: [id number] DOB: [birth year] Today's Date: 07/09/2015    History of Present Illness Patient is a 82 y/o male with hx of vertigo, HTN, hypothyroidism, complex seizures and OA presents after having multiple seizures.   Clinical Impression   Pt falls asleep when not stimulated, demonstrates generalized weakness and impaired balance. Wife with concerns about pt's increasing tendency to sleep during the day and to be awake at night and reports episodes of confusion prior to admission. Pt's wife with fractured L wrist limiting her ability to physically assist. Recommending SNF.     Follow Up Recommendations  SNF;Supervision/Assistance - 24 hour     Equipment Recommendations  None recommended by OT    Recommendations for Other Services       Precautions / Restrictions Precautions Precautions: Fall Restrictions Weight Bearing Restrictions: No      Mobility Bed Mobility Overal bed mobility: Needs Assistance Bed Mobility: Supine to Sit;Sit to Supine     Supine to sit: Supervision Sit to supine: Supervision   General bed mobility comments: increased time, use of bed rail, assisted R LE with UEs  Transfers Overall transfer level: Needs assistance Equipment used: Rolling walker (2 wheeled) Transfers: Sit to/from Stand Sit to Stand: Min guard         General transfer comment: cues for hand placement, used momentum from low surface    Balance Overall balance assessment: Needs assistance   Sitting balance-Leahy Scale: Good     Standing balance support: During functional activity Standing balance-Leahy Scale: Poor Standing balance comment: dependent on UE support                            ADL Overall ADL's : Needs assistance/impaired Eating/Feeding: Independent;Sitting   Grooming: Wash/dry hands;Wash/dry face;Sitting;Set up   Upper Body Bathing: Minimal assitance;Sitting   Lower  Body Bathing: Minimal assistance;Sit to/from stand   Upper Body Dressing : Supervision/safety;Sitting   Lower Body Dressing: Minimal assistance;Sit to/from stand   Toilet Transfer: Min guard;Ambulation;RW   Toileting- Clothing Manipulation and Hygiene: Minimal assistance       Functional mobility during ADLs: Rolling walker;Min guard       Vision     Perception     Praxis      Pertinent Vitals/Pain Pain Assessment: Faces Pain Score: 6  Faces Pain Scale: Hurts even more Pain Location: back--chronic Pain Descriptors / Indicators: Aching Pain Intervention(s): Repositioned;Monitored during session;Premedicated before session     Hand Dominance Right   Extremity/Trunk Assessment Upper Extremity Assessment Upper Extremity Assessment: Generalized weakness   Lower Extremity Assessment Lower Extremity Assessment: Defer to PT evaluation RLE Deficits / Details: drop foot, does not wear his AFO       Communication Communication Communication: No difficulties   Cognition Arousal/Alertness: Lethargic;Suspect due to medications Behavior During Therapy: [facility] for tasks assessed/performed Overall Cognitive Status: Within Functional Limits for tasks assessed (wife reports confusion at home, especially at night)                     General Comments       Exercises       Shoulder Instructions      Home Living Family/patient expects to be discharged to:: Private residence Living Arrangements: Spouse/significant other Available Help at Discharge: Family;Available 24 hours/day Type of Home: House Home Access: Stairs to enter [employer] of Steps: 1 small stoop   Home Layout: One  level     Bathroom Shower/Tub: Occupational psychologist: Handicapped height Bathroom Accessibility: Yes How Accessible: Accessible via walker Home Equipment: Gann - 4 wheels;Cane - single point;Bedside commode;Shower seat;Walker - 2 wheels;Walker - standard;Grab  bars - tub/shower (3 [employer])          Prior Functioning/Environment Level of Independence: Independent with assistive device(s)        Comments: Used 3 wheeled rollator or 4 wheeled rollator. Drives. Wife broke her arm so is in a sling LUE.    OT Diagnosis: Generalized weakness;Cognitive deficits (chronic pain)   OT Problem List: Decreased activity tolerance;Impaired balance (sitting and/or standing);Decreased strength;Decreased cognition;Decreased safety awareness;Decreased knowledge of use of DME or AE;Pain   OT Treatment/Interventions:      OT Goals(Current goals can be found in the care plan section) Acute Rehab OT Goals Patient Stated Goal: to return home  OT Frequency:     Barriers to D/C:            Co-evaluation              End of Session Equipment Utilized During Treatment: Rolling walker;Gait belt  Activity Tolerance: Patient limited by fatigue Patient left: in bed;with call bell/phone within reach;with bed alarm set;with family/visitor present;with nursing/sitter in room   Time: 1038-1110 OT Time Calculation (min): 32 min Charges:  OT General Charges $OT Visit: 1 Procedure OT Evaluation $OT Eval Moderate Complexity: 1 Procedure G-Codes:    Malka So 07/09/2015, 11:18 AM  [phone]

## 2015-07-09 NOTE — Discharge Instructions (Signed)
Valproic Acid, Divalproex Sodium delayed or extended-release tablets °What is this medicine? °DIVALPROEX SODIUM (dye VAL pro ex SO dee um) is used to prevent seizures caused by some forms of epilepsy. It is also used to treat bipolar mania and to prevent migraine headaches. °This medicine may be used for other purposes; ask your health care provider or pharmacist if you have questions. °What should I tell my health care provider before I take this medicine? °They need to know if you have any of these conditions: °-blood disease °-brain damage or disease °-kidney disease °-liver disease °-low blood proteins °-mitochondrial disease °-suicidal thoughts, plans, or attempt; a previous suicide attempt by you or a family member °-urea cycle disorder (UCD) °-an unusual or allergic reaction to divalproex sodium, other medicines, foods, dyes, or preservatives °-pregnant or trying to get pregnant °-breast-feeding °How should I use this medicine? °Take this medicine by mouth with a drink of water. Follow the directions on the prescription label. Do not crush or chew. If this medicine upsets your stomach, take it with food or milk. Take your medicine at regular intervals. Do not take it more often than directed. °Talk to your pediatrician regarding the use of this medicine in children. Special care may be needed. °Overdosage: If you think you have taken too much of this medicine contact a poison control center or emergency room at once. °NOTE: This medicine is only for you. Do not share this medicine with others. °What if I miss a dose? °If you miss a dose, take it as soon as you can. If it is almost time for your next dose, take only that dose. Do not take double or extra doses. °What may interact with this medicine? °-aspirin °-barbiturates, like phenobarbital °-diazepam °-isoniazid °-medicines for depression, anxiety, or psychotic disturbances °-medicines that treat or prevent blood clots like warfarin °-meropenem °-other  seizure medicines °-rifampin °-tolbutamide °-zidovudine °This list may not describe all possible interactions. Give your health care provider a list of all the medicines, herbs, non-prescription drugs, or dietary supplements you use. Also tell them if you smoke, drink alcohol, or use illegal drugs. Some items may interact with your medicine. °What should I watch for while using this medicine? °Visit your doctor or health care professional for regular checks on your progress. If you are taking this medicine to treat epilepsy (seizures), do not stop taking it suddenly. This increases the risk of seizures. Wear a medical identification bracelet or chain to say you have epilepsy or seizures, and carry a card that lists all your medicines. °You may get drowsy, dizzy, or have blurred vision. Do not drive, use machinery, or do anything that needs mental alertness until you know how this medicine affects you. To reduce dizzy or fainting spells, do not sit or stand up quickly, especially if you are an older patient. Alcohol can increase drowsiness and dizziness. Avoid alcoholic drinks. °This medicine can cause blood problems. This can mean slow healing and a risk of infection. Problems can arise if you need dental work, and in the day to day care of your teeth. Try to avoid damage to your teeth and gums when you brush or floss your teeth. °This medicine can make you more sensitive to the sun. Keep out of the sun. If you cannot avoid being in the sun, wear protective clothing and use sunscreen. Do not use sun lamps or tanning beds/booths. °The use of this medicine may increase the chance of suicidal thoughts or actions. Pay special attention to   how you are responding while on this medicine. Any worsening of mood, or thoughts of suicide or dying should be reported to your health care professional right away. °Women who become pregnant while using this medicine may enroll in the [place] Antiepileptic Drug Pregnancy  Registry by calling 1-888-233-2334. This registry collects information about the safety of antiepileptic drug use during pregnancy. °Contact your doctor or healthcare professional if you notice any part of your medicine in your stool. Your healthcare provider may want to check the amount of medicine in your blood if this happens. °What side effects may I notice from receiving this medicine? °Side effects that you should report to your doctor or health care professional as soon as possible: °-allergic reactions like skin rash, itching or hives, swelling of the face, lips, or tongue °-changes in the frequency or severity of seizures °-double vision or uncontrollable eye movements °-nausea and vomiting °-redness, blistering, peeling or loosening of the skin, including inside the mouth °-stomach pain or cramps °-trembling of hands or arms °-unusual bleeding or bruising or pinpoint red spots on the skin °-unusual swelling of the arms or legs °-unusually weak or tired °-worsening of mood, thoughts or actions of suicide or dying °-yellowing of skin or eyes °Side effects that usually do not require medical attention (report to your doctor or health care professional if they continue or are bothersome): °-change in menstrual cycle °-diarrhea or constipation °-headache °-loss of bladder control °-loss of hair or unusual growth of hair °-loss or increase in appetite °-weight gain or loss °This list may not describe all possible side effects. Call your doctor for medical advice about side effects. You may report side effects to FDA at 1-800-FDA-1088. °Where should I keep my medicine? °Keep out of reach of children. °Store at room temperature between 15 and 30 degrees C (59 and 86 degrees F). Keep container tightly closed. Throw away any unused medicine after the expiration date. °NOTE: This sheet is a summary. It may not cover all possible information. If you have questions about this medicine, talk to your doctor, pharmacist,  or health care provider. °  °© 2016, [employer]. (2011-10-23 11:50:42) ° °

## 2015-07-09 NOTE — Progress Notes (Addendum)
Physical Therapy Treatment Patient Details Name: [name] MRN: [id number] DOB: 1933/08/03 Today's Date: 07/09/2015    History of Present Illness Patient is a 82 y/o male with hx of vertigo, HTN, hypothyroidism, complex seizures and OA presents after having multiple seizures.    PT Comments    Pt progress towards goals however remains deconditioned and at high falls risk due to weakness and impaired balance.  Pt also noted with decreased safety awareness and decreased insight to deficits.Recommend ST-SNF to allow for pt to improve to supervision level of function for safe transition home as pt is at increased falls risk and was able to amb with use of RW PTA.    Follow Up Recommendations        Equipment Recommendations       Recommendations for Other Services       Precautions / Restrictions Precautions Precautions: Fall Restrictions Weight Bearing Restrictions: No    Mobility  Bed Mobility Overal bed mobility: Needs Assistance Bed Mobility: Supine to Sit;Sit to Supine     Supine to sit: Supervision Sit to supine: Supervision   General bed mobility comments: increased time, use of bed rail, assisted R LE with UEs  Transfers Overall transfer level: Needs assistance Equipment used: Rolling walker (2 wheeled)   Sit to Stand: Min guard         General transfer comment: cues for hand placement, used momentum from low surface  Ambulation/Gait                 Stairs            Wheelchair Mobility    Modified Rankin (Stroke Patients Only)       Balance     Sitting balance-Leahy Scale: Good       Standing balance-Leahy Scale: Poor                      Cognition Arousal/Alertness: Lethargic;Suspect due to medications Behavior During Therapy: [facility] for tasks assessed/performed Overall Cognitive Status: Within Functional Limits for tasks assessed (wife reports confusion at home, especially at night)                       Exercises      General Comments        Pertinent Vitals/Pain Pain Assessment: Faces Pain Score: 6  Faces Pain Scale: Hurts even more Pain Location: back--chronic Pain Descriptors / Indicators: Aching Pain Intervention(s): Repositioned;Monitored during session;Premedicated before session    Home Living Family/patient expects to be discharged to:: Private residence Living Arrangements: Spouse/significant other Available Help at Discharge: Family;Available 24 hours/day Type of Home: House Home Access: Stairs to enter   Home Layout: One level Home Equipment: Environmental consultant - 4 wheels;Cane - single point;Bedside commode;Shower seat;Walker - 2 wheels;Walker - standard;Grab bars - tub/shower (3 [employer])      Prior Function Level of Independence: Independent with assistive device(s)      Comments: Used 3 wheeled rollator or 4 wheeled rollator. Drives. Wife broke her arm so is in a sling LUE.   PT Goals (current goals can now be found in the care plan section) Acute Rehab PT Goals Patient Stated Goal: to return home    Frequency       PT Plan      Co-evaluation             End of Session           Time:  -  Charges:                       G Codes:      Kingsley Callander 07/09/2015, 12:21 PM   Kittie Plater, PT, DPT Pager #: [phone] Office #: [phone]

## 2015-07-09 NOTE — Discharge Summary (Addendum)
Physician Discharge Summary  [name] [id number] DOB: [birth year] DOA: 07/06/2015  PCP: Donnajean Lopes, MD  Admit date: 07/06/2015 Discharge date: 07/10/2015  Recommendations for Outpatient Follow-up:  Continue Depakote 750 mg every 12 hours HH orders placed on discharge but family requested SNF, PT has seen the pt again and recommended SNF  Discharge Diagnoses:  Principal Problem:   Seizures ([place]) Active Problems:   Essential hypertension   Hypothyroidism   Acute encephalopathy   GERD (gastroesophageal reflux disease)   Acute kidney injury ([place])   Hyperglycemia   Leukocytosis   Metabolic acidosis   Chronic pain    Discharge Condition: stable   Diet recommendation: as tolerated   History of present illness:  82 y.o. male with medical history significant for hypothyroidism, GERD, HLD, HTN, seizures who presented to [facility] after having multiple episodes of seizures. Because of altered mental status on the admission, patient's wife provided the history of present illness.  While in ED, patient had another seizure episode and received 2 mg of IV Ativan with resolution of the seizure. He has been seen by neurology in consultation. CT scans of the head and cervical spine did not show acute intracranial findings.  Hospital Course:   Assessment & Plan:  Principal Problem:  Seizures ([place]) - Likely secondary to noncompliance with Keppra.  - CT head and cervical spine did not show acute intracranial findings - EEG done 4/27 showed potential area of epileptogenicity in left hemisphere but poorly localized. Known history of seizure disorder, no seizure recorded  - Per neurology, stopped keppra and pt now on Depakote 750 mg Q 12 hours - No further episodes of seizures - Appreciate neurology recommendations  - May continue gabapentin   Active Problems:  Essential hypertension - Continue Norvasc and atenolol on discharge    Hypothyroidism -  Continue Synthroid on discharge    Dyslipidemia - Continue Pravachol and zetia on discharge    Acute kidney injury  - Likely from HCTZ which was placed on hold  - Repeat Cr WNL   DVT prophylaxis: Lovenox subcutaneous Code Status: full code  Family Communication: Wife at the bedside   Consultants:   Neurology  PT  Procedures:   EEG 07/07/2015  Antimicrobials:   None  Signed:  Leisa Lenz, MD  [facility] 07/09/2015, 9:35 AM  Pager #: [phone]  Time spent in minutes: more than 30 minutes    Discharge Exam: Filed Vitals:   07/09/15 0125 07/09/15 0433  BP: 109/71 129/61  Pulse: 91 89  Temp: 99.8 F (37.7 C) 99 F (37.2 C)  Resp: 18 18   Filed Vitals:   07/08/15 1849 07/08/15 2129 07/09/15 0125 07/09/15 0433  BP: 117/59 119/65 109/71 129/61  Pulse: 78 82 91 89  Temp: 97.8 F (36.6 C) 98.3 F (36.8 C) 99.8 F (37.7 C) 99 F (37.2 C)  TempSrc: Oral Oral Oral Oral  Resp: 17 18 18 18   SpO2: 97% 98% 100% 100%    General: Pt is alert, follows commands appropriately, not in acute distress Cardiovascular: Regular rate and rhythm, S1/S2 +, no murmurs Respiratory: Clear to auscultation bilaterally, no wheezing, no crackles, no rhonchi Abdominal: Soft, non tender, non distended, bowel sounds +, no guarding Extremities: no edema, no cyanosis, pulses palpable bilaterally DP and PT; ecchymoses on UE scattered, few areas of skin tear  Neuro: Grossly nonfocal  Discharge Instructions  Discharge Instructions    Ambulatory referral to Neurology    Complete by:  As directed  An appointment is requested in approximately: 4 weeks     Call MD for:  difficulty breathing, headache or visual disturbances    Complete by:  As directed      Call MD for:  persistant dizziness or light-headedness    Complete by:  As directed      Call MD for:  persistant nausea and vomiting    Complete by:  As directed      Call MD for:  severe uncontrolled pain     Complete by:  As directed      Diet - low sodium heart healthy    Complete by:  As directed      Discharge instructions    Complete by:  As directed   Continue Depakote 750 mg every 12 hours     Increase activity slowly    Complete by:  As directed             Medication List    STOP taking these medications        doxycycline 50 MG capsule  Commonly known as:  VIBRAMYCIN     levETIRAcetam 250 MG tablet  Commonly known as:  KEPPRA      TAKE these medications        alendronate 70 MG tablet  Commonly known as:  FOSAMAX  Take 70 mg by mouth Once a week. Sundays     amLODipine 5 MG tablet  Commonly known as:  NORVASC  Take 5 mg by mouth Daily.     aspirin EC 81 MG tablet  Take 81 mg by mouth at bedtime.     atenolol 25 MG tablet  Commonly known as:  TENORMIN  Take 25 mg by mouth daily.     calcium carbonate 600 MG Tabs tablet  Commonly known as:  OS-CAL  Take 600 mg by mouth 2 (two) times daily with a meal.     CoQ10 200 MG Caps  Take 200 mg by mouth at bedtime.     cyanocobalamin 500 MCG tablet  Take 500 mcg by mouth at bedtime.     divalproex 250 MG DR tablet  Commonly known as:  DEPAKOTE  Take 3 tablets (750 mg total) by mouth every 12 (twelve) hours.     docusate sodium 100 MG capsule  Commonly known as:  COLACE  Take 200 mg by mouth 2 (two) times daily.     gabapentin 300 MG capsule  Commonly known as:  NEURONTIN  Take 300 mg by mouth 2 (two) times daily.     hydrochlorothiazide 25 MG tablet  Commonly known as:  HYDRODIURIL  Take 25 mg by mouth daily.     HYDROcodone-acetaminophen 10-325 MG tablet  Commonly known as:  NORCO  Take 1 tablet by mouth every 4 (four) hours as needed for moderate pain.     levothyroxine 150 MCG tablet  Commonly known as:  SYNTHROID, LEVOTHROID  Take 150 mcg by mouth daily before breakfast.     losartan 100 MG tablet  Commonly known as:  COZAAR  Take 1 tablet (100 mg total) by mouth daily.     multivitamin  with minerals Tabs tablet  Take 1 tablet by mouth at bedtime.     nitroGLYCERIN 0.4 MG SL tablet  Commonly known as:  NITROSTAT  Place 0.4 mg under the tongue every 5 (five) minutes as needed for chest pain.     omeprazole 20 MG capsule  Commonly known as:  PRILOSEC  Take 20 mg  by mouth Daily.     polyethylene glycol packet  Commonly known as:  MIRALAX / GLYCOLAX  Take 8.5 g by mouth daily as needed for mild constipation.     pravastatin 40 MG tablet  Commonly known as:  PRAVACHOL  Take 40 mg by mouth daily.     predniSONE 10 MG tablet  Commonly known as:  DELTASONE  Take 10 mg by mouth Daily.     senna 8.6 MG tablet  Commonly known as:  SENOKOT  Take 1 tablet by mouth 2 (two) times daily.     sulindac 200 MG tablet  Commonly known as:  CLINORIL  Take 200 mg by mouth 2 (two) times daily.     Vitamin D-3 1000 units Caps  Take 1,000 Units by mouth 2 (two) times daily.     ZETIA 10 MG tablet  Generic drug:  ezetimibe  Take 5 mg by mouth at bedtime.           Follow-up Information    Follow up with Donnajean Lopes, MD. Schedule an appointment as soon as possible for a visit in 1 week.   Specialty:  Internal Medicine   Why:  Follow up appt after recent hospitalization   Contact information:   [address] [place] [address] [phone]        The results of significant diagnostics from this hospitalization (including imaging, microbiology, ancillary and laboratory) are listed below for reference.    Significant Diagnostic Studies: Ct Head Wo Contrast  07/06/2015  CLINICAL DATA:  Fall.  Unresponsive. EXAM: CT HEAD WITHOUT CONTRAST CT CERVICAL SPINE WITHOUT CONTRAST TECHNIQUE: Multidetector CT imaging of the head and cervical spine was performed following the standard protocol without intravenous contrast. Multiplanar CT image reconstructions of the cervical spine were also generated. COMPARISON:  01/31/2015 head CT FINDINGS: CT HEAD FINDINGS Skull and  Sinuses:Negative for fracture or destructive process. Nasal trumpet in place. The visualized mastoids, middle ears, and imaged paranasal sinuses are clear. Visualized orbits: Bilateral cataract resection.  No acute finding. Brain: No evidence of acute infarction, hemorrhage, hydrocephalus, or mass lesion/mass effect. Mild for age periventricular microvascular ischemic change. No cortical finding to explain seizure. CT CERVICAL SPINE FINDINGS Advanced disc and facet degeneration with posterior fixation at C2-3 and from C5-T1. There has been ACDF with ventral plate from 624THL. Bony fusion appears solid at the operative levels. The C2 screw tips are in the lateral canal, chronic given there is no signs of hardware fracture or displacement. There is dextroscoliosis and multilevel listhesis effusion. No gross canal stenosis or hematoma. No prevertebral edema or acute fracture. There is remote T2 spinous process fracture Advanced adjacent segment disc degeneration at C7-T1 with complete disc loss. There is left foramen T1-T2 gas of doubtful clinical significance. IMPRESSION: 1. No acute intracranial or cervical spine finding. 2. 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 3. No cortical finding to explain seizure. Electronically Signed   By: Monte Fantasia M.D.   On: 07/06/2015 06:23   Ct Chest W Contrast  07/06/2015  CLINICAL DATA:  Fall.  Gross hematuria.  Unresponsive. EXAM: CT CHEST, ABDOMEN AND PELVIS WITHOUT CONTRAST TECHNIQUE: Multidetector CT imaging of the chest, abdomen and pelvis was performed following the standard protocol without IV contrast. COMPARISON:  01/31/2015 FINDINGS: CT CHEST FINDINGS THORACIC INLET/BODY WALL: No acute abnormality. MEDIASTINUM: Normal heart size. No pericardial effusion. No acute vascular abnormality. No adenopathy. Extensive atherosclerosis, including the coronary arteries. LUNG WINDOWS: No contusion, hemothorax, or  pneumothorax.   Dependent atelectasis. OSSEOUS: See below CT ABDOMEN AND PELVIS FINDINGS BODY WALL: Unremarkable. Hepatobiliary: No focal liver abnormality.Cholelithiasis. Pancreas: Fatty atrophy.  No acute finding. Spleen: Unremarkable. Adrenals/Urinary Tract: Negative adrenals. Bilateral renal cysts. 22 mm soft tissue density mass exophytic from the interpolar right kidney is indeterminate without precontrast imaging. Appearance is stable from prior. There is an additional 10 mm soft tissue density lesion in the left kidney, also stable. No hydronephrosis or stone. Unremarkable bladder- no clot seen within the urinary bladder. Reproductive:No pathologic findings. Stomach/Bowel: No evidence of injury. Colonic diverticulosis. Large stool volume without obstruction or impaction. Vascular/Lymphatic: Atherosclerosis. No acute vascular abnormality. No mass or adenopathy. Peritoneal: No ascites or pneumoperitoneum. Musculoskeletal: Negative for fracture. There are bilateral chronic rib fractures with callus. Severe lumbar spine degenerative disease status post decompressive laminectomy. There is calcified arachnoiditis at the operative lumbar levels. Lumbar dextroscoliosis from asymmetric disc narrowing and vertebral body wedging. There are chronic bilateral pars defects at L4 and L5 with grade 1 anterolisthesis at L4-5. IMPRESSION: 1. No acute finding. 2. Complex cysts or solid renal masses in the bilateral kidneys are stable from 2016, the larger on the right measuring 22 mm. If the patient is a treatment candidate, renal MRI could be used to differentiate. MRI would be most diagnostic when an outpatient at baseline. 3. Cholelithiasis. Electronically Signed   By: Monte Fantasia M.D.   On: 07/06/2015 06:43   Ct Cervical Spine Wo Contrast  07/06/2015  CLINICAL DATA:  Fall.  Unresponsive. EXAM: CT HEAD WITHOUT CONTRAST CT CERVICAL SPINE WITHOUT CONTRAST TECHNIQUE: Multidetector CT imaging of the head and cervical spine was performed  following the standard protocol without intravenous contrast. Multiplanar CT image reconstructions of the cervical spine were also generated. COMPARISON:  01/31/2015 head CT FINDINGS: CT HEAD FINDINGS Skull and Sinuses:Negative for fracture or destructive process. Nasal trumpet in place. The visualized mastoids, middle ears, and imaged paranasal sinuses are clear. Visualized orbits: Bilateral cataract resection.  No acute finding. Brain: No evidence of acute infarction, hemorrhage, hydrocephalus, or mass lesion/mass effect. Mild for age periventricular microvascular ischemic change. No cortical finding to explain seizure. CT CERVICAL SPINE FINDINGS Advanced disc and facet degeneration with posterior fixation at C2-3 and from C5-T1. There has been ACDF with ventral plate from 624THL. Bony fusion appears solid at the operative levels. The C2 screw tips are in the lateral canal, chronic given there is no signs of hardware fracture or displacement. There is dextroscoliosis and multilevel listhesis effusion. No gross canal stenosis or hematoma. No prevertebral edema or acute fracture. There is remote T2 spinous process fracture Advanced adjacent segment disc degeneration at C7-T1 with complete disc loss. There is left foramen T1-T2 gas of doubtful clinical significance. IMPRESSION: 1. No acute intracranial or cervical spine finding. 2. 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 3. No cortical finding to explain seizure. Electronically Signed   By: Monte Fantasia M.D.   On: 07/06/2015 06:23   Ct Abdomen Pelvis W Contrast  07/06/2015  CLINICAL DATA:  Fall.  Gross hematuria.  Unresponsive. EXAM: CT CHEST, ABDOMEN AND PELVIS WITHOUT CONTRAST TECHNIQUE: Multidetector CT imaging of the chest, abdomen and pelvis was performed following the standard protocol without IV contrast. COMPARISON:  01/31/2015 FINDINGS: CT CHEST FINDINGS THORACIC INLET/BODY WALL: No acute abnormality.  MEDIASTINUM: Normal heart size. No pericardial effusion. No acute vascular abnormality. No adenopathy. Extensive atherosclerosis, including the coronary arteries. LUNG WINDOWS: No contusion, hemothorax, or pneumothorax.  Dependent atelectasis. OSSEOUS: See  below CT ABDOMEN AND PELVIS FINDINGS BODY WALL: Unremarkable. Hepatobiliary: No focal liver abnormality.Cholelithiasis. Pancreas: Fatty atrophy.  No acute finding. Spleen: Unremarkable. Adrenals/Urinary Tract: Negative adrenals. Bilateral renal cysts. 22 mm soft tissue density mass exophytic from the interpolar right kidney is indeterminate without precontrast imaging. Appearance is stable from prior. There is an additional 10 mm soft tissue density lesion in the left kidney, also stable. No hydronephrosis or stone. Unremarkable bladder- no clot seen within the urinary bladder. Reproductive:No pathologic findings. Stomach/Bowel: No evidence of injury. Colonic diverticulosis. Large stool volume without obstruction or impaction. Vascular/Lymphatic: Atherosclerosis. No acute vascular abnormality. No mass or adenopathy. Peritoneal: No ascites or pneumoperitoneum. Musculoskeletal: Negative for fracture. There are bilateral chronic rib fractures with callus. Severe lumbar spine degenerative disease status post decompressive laminectomy. There is calcified arachnoiditis at the operative lumbar levels. Lumbar dextroscoliosis from asymmetric disc narrowing and vertebral body wedging. There are chronic bilateral pars defects at L4 and L5 with grade 1 anterolisthesis at L4-5. IMPRESSION: 1. No acute finding. 2. Complex cysts or solid renal masses in the bilateral kidneys are stable from 2016, the larger on the right measuring 22 mm. If the patient is a treatment candidate, renal MRI could be used to differentiate. MRI would be most diagnostic when an outpatient at baseline. 3. Cholelithiasis. Electronically Signed   By: Monte Fantasia M.D.   On: 07/06/2015 06:43     Microbiology: No results found for this or any previous visit (from the past 240 hour(s)).   Labs: Basic Metabolic Panel:  Recent Labs Lab 07/06/15 0445 07/06/15 0500 07/06/15 1333 07/07/15 0606  NA 139 141  --  141  K 3.6 3.6  --  4.3  CL 101 101  --  105  CO2 16*  --   --  24  GLUCOSE 221* 208*  --  93  BUN 32* 33*  --  18  CREATININE 1.89* 1.60*  --  1.22  CALCIUM 9.1  --   --  8.7*  MG 2.1  --   --   --   PHOS  --   --  3.2  --    Liver Function Tests:  Recent Labs Lab 07/06/15 0445 07/07/15 0606  AST 49* 88*  ALT 38 38  ALKPHOS 60 56  BILITOT 0.5 1.3*  PROT 6.3* 5.9*  ALBUMIN 3.9 3.7   No results for input(s): LIPASE, AMYLASE in the last 168 hours. No results for input(s): AMMONIA in the last 168 hours. CBC:  Recent Labs Lab 07/06/15 0445 07/06/15 0500 07/07/15 0606  WBC 13.8*  --  9.1  NEUTROABS 9.1*  --   --   HGB 12.5* 13.9 12.7*  HCT 39.9 41.0 39.2  MCV 98.8  --  94.5  PLT 260  --  195   Cardiac Enzymes:  Recent Labs Lab 07/06/15 0445  CKTOTAL 275   BNP: BNP (last 3 results) No results for input(s): BNP in the last 8760 hours.  ProBNP (last 3 results) No results for input(s): PROBNP in the last 8760 hours.  CBG:  Recent Labs Lab 07/07/15 1937 07/08/15 0818 07/08/15 1159 07/08/15 1646 07/08/15 2127  GLUCAP 104* 74 117* 119* 110*

## 2015-07-10 NOTE — Clinical Social Work Placement (Signed)
   CLINICAL SOCIAL WORK PLACEMENT  NOTE  Date:  07/10/2015  Patient Details  Name: [name] MRN: [id number] Date of Birth: 07-16-1933  Clinical Social Work is seeking post-discharge placement for this patient at the [place] level of care (*CSW will initial, date and re-position this form in  chart as items are completed):  Yes   Patient/family provided with [facility] Work Department's list of facilities offering this level of care within the geographic area requested by the patient (or if unable, by the patient's family).  Yes   Patient/family informed of their freedom to choose among providers that offer the needed level of care, that participate in Medicare, Medicaid or managed care program needed by the patient, have an available bed and are willing to accept the patient.  Yes   Patient/family informed of [facility]'s ownership interest in [facility] and [facility], as well as of the fact that they are under no obligation to receive care at these facilities.  PASRR submitted to EDS on       PASRR number received on       Existing PASRR number confirmed on 07/10/15     FL2 transmitted to all facilities in geographic area requested by pt/family on 07/10/15     FL2 transmitted to all facilities within larger geographic area on       Patient informed that his/her managed care company has contracts with or will negotiate with certain facilities, including the following:            Patient/family informed of bed offers received.  Patient chooses bed at       Physician recommends and patient chooses bed at      Patient to be transferred to   on  .  Patient to be transferred to facility by       Patient family notified on   of transfer.  Name of family member notified:        PHYSICIAN       Additional Comment:    _______________________________________________ [name], LCSW 07/10/2015, 2:29 PM

## 2015-07-10 NOTE — Progress Notes (Signed)
Patient seen and examined at the bedside. Patient's wife at the bedside.  Patient medically stable for discharge to skilled nursing facility today. Awaiting social work assistance with placement. No changes in medical management since 07/09/2015. Please refer to discharge summary completed 07/09/2015.  Leisa Lenz [facility] [phone]

## 2015-07-10 NOTE — Care Management Important Message (Signed)
Important Message  Patient Details  Name: [name] MRN: [id number] Date of Birth: [birth year]   Medicare Important Message Given:  Yes  Pt given HINN; pt and wife have exercised their right to Appeal Discharge of pt; Detailed Notice of Discharge signed by pt rep(wife).  CM faxed facesheet, IM, Detailed Notice of Discharge and HINN to [facility].  Dellie Catholic, RN 07/10/2015, 5:28 PM

## 2015-07-10 NOTE — Clinical Social Work Note (Signed)
Clinical Social Work Assessment  Patient Details  Name: [name] MRN: [id number] Date of Birth: [birth year]  Date of referral:  07/10/15               Reason for consult:  Facility Placement                Permission sought to share information with:  Facility Sport and exercise psychologist, Case Optician, dispensing granted to share information::  Yes, Verbal Permission Granted  Name::        Agency::     Relationship::     Contact Information:     Housing/Transportation Living arrangements for the past 2 months:  Single Family Home Source of Information:  Patient, Spouse Patient Interpreter Needed:  None Criminal Activity/Legal Involvement Pertinent to Current Situation/Hospitalization:  No - Comment as needed Significant Relationships:  Adult Children, Spouse, Neighbor Lives with:    Do you feel safe going back to the place where you live?  No Need for family participation in patient care:  No (Coment)  Care giving concerns: Pt's wife is not sure if pt will be safe returning home at d/c, although that is the d/c plan both prefer.  Wife is requesting that PT/OT work with pt again prior to d/c to help her decide between SNF/home with HHC.    Social Worker assessment / plan:  CSW met with pt/wife for an extended amount of time re: d/c planning arrangements.  Per MD, pt is d/c'ed and needs SNF placement.  Wife cannot make decision re: taking pt home or pursuing placement and wants further PT/OT sessions, as well as having nursing staff walk him around the unit several times.  She needs to know if he can make it from his bedroom to the bathroom, get in/out of shower, feed himself etc.  Per wife, pt has gone from being "zonked" back to his baseline in less than 24 hours and she believes that he still needs to be observed on his Depakote to see if he will go back to being "zonked."  Pt's wife agreeable to placement if he is unable to perform ADLs/walk with walker at d/c or [facility] if she decides she  can take him home at d/c.  Explained that pt had been d/c'ed and needed to go somewhere today, however wife reluctant to decide re: d/c plan.  CSW c/s with RNCM who would speak with pt/wife re: appealing d/c.  CSW will pursue placement on pt's behalf with pt/wife permission and facilitate NH tx, if appropriate.  Pt/wife familiar with placement process from previous admission. Employment status:  Retired Forensic scientist:  [employer] PT Recommendations:  [place], [place] / Referral to community resources:  [place]  Patient/Family's Response to care: Pt's wife unhappy and feels as if MD has been too quick to d/c patient without further PT/OT w/u.  She is reluctant to make decision re: d/c plan. Patient/Family's Understanding of and Emotional Response to Diagnosis, Current Treatment, and Prognosis:  Pt/wife very preoccupied with pt's medications, especially his Oxycontin and Depakote.  Pt's wife concerned about how pt will function on Depakote and if he will be able to care for self at home.  Pt's wife does not agree with d/c, and feels pt is being pushed out of the hospital too soon.   Emotional Assessment Appearance:  Appears stated age Attitude/Demeanor/Rapport:   (cooperative) Affect (typically observed):  Calm Orientation:  Oriented to Self, Oriented to Place, Oriented to  Time,  Oriented to Situation Alcohol / Substance use:  Not Applicable Psych involvement (Current and /or in the community):  No (Comment)  Discharge Needs  Concerns to be addressed:  Home Safety Concerns, Decision making concerns Readmission within the last 30 days:    Current discharge risk:  Other (wife unwilling to make decision re d/c plan) Barriers to Discharge:  Family Issues   [name], LCSW 07/10/2015, 2:32 PM

## 2015-07-10 NOTE — NC FL2 (Signed)
[place] LEVEL OF CARE SCREENING TOOL     IDENTIFICATION  Patient Name: [name] Birthdate: 08-Jan-1934 Sex: male Admission Date (Current Location): 07/06/2015  [facility] and [place] Number:  Herbalist and Address:  [facility] [place], [place] [address], [place], [place] [address]      Provider Number: [id number]  Attending Physician Name and Address:  Robbie Lis, MD  Relative Name and Phone Number:       Current Level of Care: Hospital Recommended Level of Care: [place] Prior Approval Number:    Date Approved/Denied:   PASRR Number:  ([id number])  Discharge Plan: SNF    Current Diagnoses: Patient Active Problem List   Diagnosis Date Noted  . Seizures ([place]) 07/06/2015  . Hyperglycemia 07/06/2015  . Leukocytosis 07/06/2015  . Metabolic acidosis 0000000  . Chronic pain 07/06/2015  . Chronic insomnia 03/30/2015  . Transient alteration of awareness 03/30/2015  . Abnormal liver function   . Sepsis ([place]) 01/31/2015  . Altered mental status 01/31/2015  . Essential hypertension 01/31/2015  . Constipation 01/31/2015  . Hypothyroidism 01/31/2015  . Acute encephalopathy 01/31/2015  . Bladder outlet obstruction 01/31/2015  . GERD (gastroesophageal reflux disease) 01/31/2015  . Chronic back pain 01/31/2015  . Sigmoid diverticulitis 01/31/2015  . Acute kidney injury ([place]) 01/31/2015    Orientation RESPIRATION BLADDER Height & Weight     Self, Time, Situation, Place  Normal Continent Weight:   Height:     BEHAVIORAL SYMPTOMS/MOOD NEUROLOGICAL BOWEL NUTRITION STATUS    Convulsions/Seizures Continent  (heart healthy)  AMBULATORY STATUS COMMUNICATION OF NEEDS Skin   Limited Assist Verbally Normal                       Personal Care Assistance Level of Assistance  Feeding, Bathing, Dressing Bathing Assistance: Limited assistance Feeding assistance: Independent Dressing Assistance: Limited  assistance     Functional Limitations Info  Sight, Hearing, Speech Sight Info: Adequate Hearing Info: Adequate Speech Info: Adequate    SPECIAL CARE FACTORS FREQUENCY  PT (By licensed PT), OT (By licensed OT)     PT Frequency:  (5x/week) OT Frequency:  (5x/week)            Contractures Contractures Info: Not present    Additional Factors Info  Code Status, Allergies (full code)   Allergies Info:  (demerol)           Current Medications (07/10/2015):  This is the current hospital active medication list Current Facility-Administered Medications  Medication Dose Route Frequency Provider Last Rate Last Dose  . 0.45 % sodium chloride infusion   Intravenous Continuous [name], MD 75 mL/hr at 07/08/15 2144    . acetaminophen (TYLENOL) tablet 650 mg  650 mg Oral Q6H PRN [name], MD       Or  . acetaminophen (TYLENOL) suppository 650 mg  650 mg Rectal Q6H PRN [name], MD      . amLODipine (NORVASC) tablet 5 mg  5 mg Oral Daily [name], MD   5 mg at 07/10/15 0848  . aspirin EC tablet 81 mg  81 mg Oral QHS [name], MD   81 mg at 07/09/15 2129  . atenolol (TENORMIN) tablet 25 mg  25 mg Oral Daily [name], MD   25 mg at 07/10/15 0848  . bisacodyl (DULCOLAX) suppository 10 mg  10 mg Rectal Daily PRN Robbie Lis, MD  10 mg at 07/08/15 1749  . divalproex (DEPAKOTE) DR tablet 750 mg  750 mg Oral Q12H Greta Doom, MD   750 mg at 07/10/15 0851  . enoxaparin (LOVENOX) injection 40 mg  40 mg Subcutaneous Q24H [name], MD   40 mg at 07/10/15 0849  . ezetimibe (ZETIA) tablet 5 mg  5 mg Oral QHS [name], MD   5 mg at 07/09/15 2129  . gabapentin (NEURONTIN) capsule 300 mg  300 mg Oral BID [name], MD   300 mg at 07/10/15 0848  . HYDROcodone-acetaminophen (NORCO/VICODIN) 5-325 MG per tablet 1-2 tablet  1-2 tablet Oral Q4H PRN Jeryl Columbia, NP   2 tablet at 07/10/15 [phone]  . labetalol (NORMODYNE,TRANDATE)  injection 10 mg  10 mg Intravenous Q2H PRN [name], MD      . levothyroxine (SYNTHROID, LEVOTHROID) tablet 150 mcg  150 mcg Oral QAC breakfast [name], MD   150 mcg at 07/10/15 0848  . LORazepam (ATIVAN) injection 1-2 mg  1-2 mg Intravenous Q2H PRN [name], MD      . losartan (COZAAR) tablet 100 mg  100 mg Oral Daily [name], MD   100 mg at 07/10/15 0848  . methylPREDNISolone sodium succinate (SOLU-MEDROL) 40 mg/mL injection 20 mg  20 mg Intravenous Once [name], MD      . ondansetron [facility]) tablet 4 mg  4 mg Oral Q6H PRN [name], MD       Or  . ondansetron [facility]) injection 4 mg  4 mg Intravenous Q6H PRN [name], MD      . polyethylene glycol [facility] / GLYCOLAX) packet 8.5 g  8.5 g Oral Daily PRN [name], MD   8.5 g at 07/08/15 1539  . pravastatin (PRAVACHOL) tablet 40 mg  40 mg Oral Daily [name], MD   40 mg at 07/10/15 0848  . predniSONE (DELTASONE) tablet 10 mg  10 mg Oral Q breakfast [name], MD   10 mg at 07/10/15 0848  . sodium chloride flush (NS) 0.9 % injection 3 mL  3 mL Intravenous Q12H [name], MD   3 mL at 07/10/15 0853  . sulindac (CLINORIL) tablet 200 mg  200 mg Oral BID [name], MD   200 mg at 07/10/15 [id number]     Discharge Medications: Please see discharge summary for a list of discharge medications.  Relevant Imaging Results:  Relevant Lab Results:   Additional Information    Brei Pociask M, LCSW

## 2015-07-11 NOTE — Clinical Social Work Note (Signed)
CSW received referral for SNF.  Case discussed with case manager and plan is to discharge home with home health.  CSW to sign off please re-consult if social work needs arise.  Kiondre Grenz R. Mikiala Fugett, MSW, LCSWA [phone]  

## 2015-07-11 NOTE — Progress Notes (Signed)
Speech Language Pathology Treatment: Dysphagia  Patient Details Name: [name] MRN: [id number] DOB: November 03, 1933 Today's Date: 07/11/2015 Time: 9826-4158 SLP Time Calculation (min) (ACUTE ONLY): 19 min  Assessment / Plan / Recommendation Clinical Impression  Pt seen for dysphagia followup. Pt was able to demonstrate independently management of regular texture diet. No s/s aspiration noted. Recommend: continue with regular diet. No further SLP followup indicated at this time.   HPI HPI: pt is a 82 yo male adm to [facility] after being found down at home= possible seizure. Pt had vtach in transit and received nasopharyngeal airway.  PMH + for chronic back pain - s/p surgeries, HTN, HLD, seizure, C2-C5 fusion.        SLP Plan  All goals met     Recommendations  Diet recommendations: Regular;Thin liquid Liquids provided via: Cup Medication Administration: Whole meds with liquid Supervision: Patient able to self feed Compensations: Slow rate;Small sips/bites Postural Changes and/or Swallow Maneuvers: Seated upright 90 degrees;Upright 30-60 min after meal             Oral Care Recommendations: Oral care BID Follow up Recommendations: None Plan: All goals met     [place] MA, [place] Pager [phone] 07/11/2015, 1:31 PM

## 2015-07-11 NOTE — Progress Notes (Signed)
Physical Therapy Treatment Patient Details Name: [name] MRN: [id number] DOB: [birth year] Today's Date: 07/11/2015    History of Present Illness Patient is a 82 y/o male with hx of vertigo, HTN, hypothyroidism, complex seizures and OA presents after having multiple seizures.    PT Comments    Pt with improved level of alertness. Pt functioning a min guard level for ambulation and OOB transfers however remains weak and unsteady requiring assist for ADLs.  Discussed at length with wife and pt regarding pt's condition and how much he depends on wife PTA as wife now with L wrist fracture and can not assist him as much as before. Pt WILL BENEFIT FROM HH AIDE FOR BATHING.   Follow Up Recommendations  Home health PT;Supervision/Assistance - 24 hour recommend ST-SNF for pt to achieve safe over all supervision level of function however pt and spouse adamant about going home     Equipment Recommendations  None recommended by PT    Recommendations for Other Services       Precautions / Restrictions Precautions Precautions: Fall Precaution Comments: R drop foot Restrictions Weight Bearing Restrictions: No    Mobility  Bed Mobility Overal bed mobility: Needs Assistance Bed Mobility: Supine to Sit;Sit to Supine     Supine to sit: Supervision Sit to supine: Supervision   General bed mobility comments: pt used long sit technique, did not require use of bed rail  Transfers Overall transfer level: Needs assistance Equipment used: Rolling walker (2 wheeled) Transfers: Sit to/from Stand Sit to Stand: Min guard         General transfer comment: v/c's for walker safety and hand placement, increased time, guarded  Ambulation/Gait Ambulation/Gait assistance: Min guard Ambulation Distance (Feet): 140 Feet Assistive device: Rolling walker (2 wheeled) Gait Pattern/deviations: Step-through pattern;Decreased dorsiflexion - right Gait velocity: dec Gait velocity interpretation: Below  normal speed for age/gender General Gait Details: Pt has R  AFO but doens't use it. no episodes of LOB but is dependent on UEs   Stairs            Wheelchair Mobility    Modified Rankin (Stroke Patients Only)       Balance Overall balance assessment: Needs assistance         Standing balance support: During functional activity Standing balance-Leahy Scale: Poor Standing balance comment: requires RW or external support                    Cognition Arousal/Alertness: Awake/alert Behavior During Therapy: WFL for tasks assessed/performed Overall Cognitive Status: Within Functional Limits for tasks assessed                      Exercises      General Comments        Pertinent Vitals/Pain Pain Assessment: No/denies pain    Home Living                      Prior Function            PT Goals (current goals can now be found in the care plan section) Acute Rehab PT Goals Patient Stated Goal: return home Progress towards PT goals: Progressing toward goals    Frequency  Min 3X/week    PT Plan Discharge plan needs to be updated    Co-evaluation             End of Session Equipment Utilized During Treatment: Gait belt Activity Tolerance: Patient tolerated  treatment well Patient left: in chair;with call bell/phone within reach;with chair alarm set;with family/visitor present     Time: [id number] PT Time Calculation (min) (ACUTE ONLY): 25 min  Charges:  $Gait Training: 8-22 mins $Therapeutic Activity: 8-22 mins                    G Codes:      Kingsley Callander 07/11/2015, 1:58 PM   Kittie Plater, PT, DPT Pager #: [phone] Office #: [phone]

## 2015-07-11 NOTE — Care Management Note (Addendum)
Case Management Note  Patient Details  Name: BRYSIN TOWERY MRN: [id number] Date of Birth: Apr 01, 1933  Subjective/Objective:                    Action/Plan: Patient discharging home with [facility] services. CM met with the patient and his wife and provided them a list of [facility] agencies in the [place] area. They selected [facility]. Tim with [place] notified and accepted the referral. Patients wife states that they have transportation home. Will update the bedside RN.   Expected Discharge Date:                  Expected Discharge Plan:  [place]  In-House Referral:     Discharge planning Services  CM Consult  Post Acute Care Choice:  Home Health Choice offered to:  Spouse  DME Arranged:    DME Agency:     HH Arranged:  RN, PT, OT, Nurse's Aide, Social Work [employer] Agency:  [facility]  Status of Service:  Completed, signed off  Medicare Important Message Given:  Yes Date Medicare IM Given:    Medicare IM give by:    Date Additional Medicare IM Given:    Additional Medicare Important Message give by:     If discussed at [place] of Stay Meetings, dates discussed:    Additional Comments:  Pollie Friar, RN 07/11/2015, 2:53 PM

## 2015-07-11 NOTE — Progress Notes (Signed)
Report from leaving RN and pt will be discharge at this time. Discharge instructions reviewed with patient/family. All questions answered at this time. Transport home by family.   Leahann Lempke, RN.

## 2015-07-11 NOTE — Discharge Summary (Signed)
Physician Discharge Summary  DARE TULIP [id number] DOB: 08/14/1933 DOA: 07/06/2015  PCP: Donnajean Lopes, MD  Admit date: 07/06/2015 Discharge date: 07/10/2015  Recommendations for Outpatient Follow-up:  Continue Depakote 750 mg every 12 hours HH orders placed on discharge but family requested SNF, PT has seen the pt again and recommended SNF  Please note, I spoke with the patient and patient's wife at the bedside extensively. Patient's wife hesitant about taking patient at home because she claims she has to take care of him and it's very difficult for her to do so. Patient on the other side once to go home and feels that he is capable to do physical activity at home and having home health PT come to their house. Patient has been medically stable for discharge since 07/10/2015. There are no episodes of seizures since patient started on Depakote. His mental status has been excellent, he is alert, oriented to time, place and person. Physical therapy will work with him prior to discharge to make final recommendation.  Discharge Diagnoses:  Principal Problem:   Seizures ([place]) Active Problems:   Essential hypertension   Hypothyroidism   Acute encephalopathy   GERD (gastroesophageal reflux disease)   Acute kidney injury (HCC)   Hyperglycemia   Leukocytosis   Metabolic acidosis   Chronic pain    Discharge Condition: stable   Diet recommendation: as tolerated   History of present illness:  82 y.o. male with medical history significant for hypothyroidism, GERD, HLD, HTN, seizures who presented to [facility] after having multiple episodes of seizures. Because of altered mental status on the admission, patient's wife provided the history of present illness.  While in ED, patient had another seizure episode and received 2 mg of IV Ativan with resolution of the seizure. He has been seen by neurology in consultation. CT scans of the head and cervical spine did not show acute  intracranial findings.  Hospital Course:   Assessment & Plan:  Principal Problem:  Seizures ([place]) - Likely secondary to noncompliance with Keppra.  - CT head and cervical spine did not show acute intracranial findings - EEG done 4/27 showed potential area of epileptogenicity in left hemisphere but poorly localized. Known history of seizure disorder, no seizure recorded  - Per neurology, stopped keppra and pt now on Depakote 750 mg Q 12 hours - No further episodes of seizures - May continue gabapentin   Active Problems:  Essential hypertension - Continue Norvasc and atenolol on discharge    Hypothyroidism - Continue Synthroid on discharge    Dyslipidemia - Continue Pravachol and zetia on discharge    Acute kidney injury  - Likely from HCTZ which was placed on hold  - Repeat Cr WNL   DVT prophylaxis: Lovenox subcutaneous in hospital  Code Status: full code  Family Communication: Wife at the bedside   Consultants:   Neurology  PT  Procedures:   EEG 07/07/2015  Antimicrobials:   None    Signed:  Leisa Lenz, MD  [facility] 07/11/2015, 9:54 AM  Pager #: [phone]  Time spent in minutes: less than 30 minutes    Discharge Exam: Filed Vitals:   07/11/15 0148 07/11/15 0534  BP: 125/65 112/69  Pulse: 83 72  Temp: 97.5 F (36.4 C) 98.6 F (37 C)  Resp: 18 16   Filed Vitals:   07/10/15 2106 07/11/15 0148 07/11/15 0534 07/11/15 0808  BP: 119/74 125/65 112/69   Pulse: 86 83 72   Temp: 98.1 F (36.7 C) 97.5  F (36.4 C) 98.6 F (37 C)   TempSrc: Oral Oral Oral   Resp: 18 18 16    Height:    5\' 10"  (1.778 m)  Weight:    96.163 kg (212 lb)  SpO2: 98% 98% 98%     General: Pt is more alert, follows commands appropriately, not in acute distress Cardiovascular: Regular rate and rhythm, S1/S2 +, no murmurs Respiratory: Clear to auscultation bilaterally, no wheezing, no crackles, no rhonchi Abdominal: Soft, non tender, non  distended, bowel sounds +, no guarding Extremities: no edema, no cyanosis, pulses palpable bilaterally DP and PT; ecchymoses on UE scattered, few areas of skin tear  Neuro: Grossly nonfocal  Discharge Instructions  Discharge Instructions    Ambulatory referral to Neurology    Complete by:  As directed   An appointment is requested in approximately: 4 weeks     Call MD for:  difficulty breathing, headache or visual disturbances    Complete by:  As directed      Call MD for:  persistant dizziness or light-headedness    Complete by:  As directed      Call MD for:  persistant nausea and vomiting    Complete by:  As directed      Call MD for:  severe uncontrolled pain    Complete by:  As directed      Diet - low sodium heart healthy    Complete by:  As directed      Discharge instructions    Complete by:  As directed   Continue Depakote 750 mg every 12 hours     Increase activity slowly    Complete by:  As directed             Medication List    STOP taking these medications        doxycycline 50 MG capsule  Commonly known as:  VIBRAMYCIN     levETIRAcetam 250 MG tablet  Commonly known as:  KEPPRA      TAKE these medications        alendronate 70 MG tablet  Commonly known as:  FOSAMAX  Take 70 mg by mouth Once a week. Sundays     amLODipine 5 MG tablet  Commonly known as:  NORVASC  Take 5 mg by mouth Daily.     aspirin EC 81 MG tablet  Take 81 mg by mouth at bedtime.     atenolol 25 MG tablet  Commonly known as:  TENORMIN  Take 25 mg by mouth daily.     calcium carbonate 600 MG Tabs tablet  Commonly known as:  OS-CAL  Take 600 mg by mouth 2 (two) times daily with a meal.     CoQ10 200 MG Caps  Take 200 mg by mouth at bedtime.     cyanocobalamin 500 MCG tablet  Take 500 mcg by mouth at bedtime.     divalproex 250 MG DR tablet  Commonly known as:  DEPAKOTE  Take 3 tablets (750 mg total) by mouth every 12 (twelve) hours.     docusate sodium 100 MG  capsule  Commonly known as:  COLACE  Take 200 mg by mouth 2 (two) times daily.     gabapentin 300 MG capsule  Commonly known as:  NEURONTIN  Take 300 mg by mouth 2 (two) times daily.     hydrochlorothiazide 25 MG tablet  Commonly known as:  HYDRODIURIL  Take 25 mg by mouth daily.     HYDROcodone-acetaminophen 10-325  MG tablet  Commonly known as:  NORCO  Take 1 tablet by mouth every 4 (four) hours as needed for moderate pain.     levothyroxine 150 MCG tablet  Commonly known as:  SYNTHROID, LEVOTHROID  Take 150 mcg by mouth daily before breakfast.     losartan 100 MG tablet  Commonly known as:  COZAAR  Take 1 tablet (100 mg total) by mouth daily.     multivitamin with minerals Tabs tablet  Take 1 tablet by mouth at bedtime.     nitroGLYCERIN 0.4 MG SL tablet  Commonly known as:  NITROSTAT  Place 0.4 mg under the tongue every 5 (five) minutes as needed for chest pain.     omeprazole 20 MG capsule  Commonly known as:  PRILOSEC  Take 20 mg by mouth Daily.     polyethylene glycol packet  Commonly known as:  MIRALAX / GLYCOLAX  Take 8.5 g by mouth daily as needed for mild constipation.     pravastatin 40 MG tablet  Commonly known as:  PRAVACHOL  Take 40 mg by mouth daily.     predniSONE 10 MG tablet  Commonly known as:  DELTASONE  Take 10 mg by mouth Daily.     senna 8.6 MG tablet  Commonly known as:  SENOKOT  Take 1 tablet by mouth 2 (two) times daily.     sulindac 200 MG tablet  Commonly known as:  CLINORIL  Take 200 mg by mouth 2 (two) times daily.     Vitamin D-3 1000 units Caps  Take 1,000 Units by mouth 2 (two) times daily.     ZETIA 10 MG tablet  Generic drug:  ezetimibe  Take 5 mg by mouth at bedtime.       Follow-up Information    Follow up with Donnajean Lopes, MD. Schedule an appointment as soon as possible for a visit in 1 week.   Specialty:  Internal Medicine   Why:  Follow up appt after recent hospitalization   Contact information:   [place] [address] [phone]        The results of significant diagnostics from this hospitalization (including imaging, microbiology, ancillary and laboratory) are listed below for reference.    Significant Diagnostic Studies: Ct Head Wo Contrast  07/06/2015  CLINICAL DATA:  Fall.  Unresponsive. EXAM: CT HEAD WITHOUT CONTRAST CT CERVICAL SPINE WITHOUT CONTRAST TECHNIQUE: Multidetector CT imaging of the head and cervical spine was performed following the standard protocol without intravenous contrast. Multiplanar CT image reconstructions of the cervical spine were also generated. COMPARISON:  01/31/2015 head CT FINDINGS: CT HEAD FINDINGS Skull and Sinuses:Negative for fracture or destructive process. Nasal trumpet in place. The visualized mastoids, middle ears, and imaged paranasal sinuses are clear. Visualized orbits: Bilateral cataract resection.  No acute finding. Brain: No evidence of acute infarction, hemorrhage, hydrocephalus, or mass lesion/mass effect. Mild for age periventricular microvascular ischemic change. No cortical finding to explain seizure. CT CERVICAL SPINE FINDINGS Advanced disc and facet degeneration with posterior fixation at C2-3 and from C5-T1. There has been ACDF with ventral plate from 624THL. Bony fusion appears solid at the operative levels. The C2 screw tips are in the lateral canal, chronic given there is no signs of hardware fracture or displacement. There is dextroscoliosis and multilevel listhesis effusion. No gross canal stenosis or hematoma. No prevertebral edema or acute fracture. There is remote T2 spinous process fracture Advanced adjacent segment disc degeneration at C7-T1 with complete disc loss. There  is left foramen T1-T2 gas of doubtful clinical significance. IMPRESSION: 1. No acute intracranial or cervical spine finding. 2. 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 3. No cortical finding to explain  seizure. Electronically Signed   By: Monte Fantasia M.D.   On: 07/06/2015 06:23   Ct Chest W Contrast  07/06/2015  CLINICAL DATA:  Fall.  Gross hematuria.  Unresponsive. EXAM: CT CHEST, ABDOMEN AND PELVIS WITHOUT CONTRAST TECHNIQUE: Multidetector CT imaging of the chest, abdomen and pelvis was performed following the standard protocol without IV contrast. COMPARISON:  01/31/2015 FINDINGS: CT CHEST FINDINGS THORACIC INLET/BODY WALL: No acute abnormality. MEDIASTINUM: Normal heart size. No pericardial effusion. No acute vascular abnormality. No adenopathy. Extensive atherosclerosis, including the coronary arteries. LUNG WINDOWS: No contusion, hemothorax, or pneumothorax.  Dependent atelectasis. OSSEOUS: See below CT ABDOMEN AND PELVIS FINDINGS BODY WALL: Unremarkable. Hepatobiliary: No focal liver abnormality.Cholelithiasis. Pancreas: Fatty atrophy.  No acute finding. Spleen: Unremarkable. Adrenals/Urinary Tract: Negative adrenals. Bilateral renal cysts. 22 mm soft tissue density mass exophytic from the interpolar right kidney is indeterminate without precontrast imaging. Appearance is stable from prior. There is an additional 10 mm soft tissue density lesion in the left kidney, also stable. No hydronephrosis or stone. Unremarkable bladder- no clot seen within the urinary bladder. Reproductive:No pathologic findings. Stomach/Bowel: No evidence of injury. Colonic diverticulosis. Large stool volume without obstruction or impaction. Vascular/Lymphatic: Atherosclerosis. No acute vascular abnormality. No mass or adenopathy. Peritoneal: No ascites or pneumoperitoneum. Musculoskeletal: Negative for fracture. There are bilateral chronic rib fractures with callus. Severe lumbar spine degenerative disease status post decompressive laminectomy. There is calcified arachnoiditis at the operative lumbar levels. Lumbar dextroscoliosis from asymmetric disc narrowing and vertebral body wedging. There are chronic bilateral pars  defects at L4 and L5 with grade 1 anterolisthesis at L4-5. IMPRESSION: 1. No acute finding. 2. Complex cysts or solid renal masses in the bilateral kidneys are stable from 2016, the larger on the right measuring 22 mm. If the patient is a treatment candidate, renal MRI could be used to differentiate. MRI would be most diagnostic when an outpatient at baseline. 3. Cholelithiasis. Electronically Signed   By: Monte Fantasia M.D.   On: 07/06/2015 06:43   Ct Cervical Spine Wo Contrast  07/06/2015  CLINICAL DATA:  Fall.  Unresponsive. EXAM: CT HEAD WITHOUT CONTRAST CT CERVICAL SPINE WITHOUT CONTRAST TECHNIQUE: Multidetector CT imaging of the head and cervical spine was performed following the standard protocol without intravenous contrast. Multiplanar CT image reconstructions of the cervical spine were also generated. COMPARISON:  01/31/2015 head CT FINDINGS: CT HEAD FINDINGS Skull and Sinuses:Negative for fracture or destructive process. Nasal trumpet in place. The visualized mastoids, middle ears, and imaged paranasal sinuses are clear. Visualized orbits: Bilateral cataract resection.  No acute finding. Brain: No evidence of acute infarction, hemorrhage, hydrocephalus, or mass lesion/mass effect. Mild for age periventricular microvascular ischemic change. No cortical finding to explain seizure. CT CERVICAL SPINE FINDINGS Advanced disc and facet degeneration with posterior fixation at C2-3 and from C5-T1. There has been ACDF with ventral plate from 624THL. Bony fusion appears solid at the operative levels. The C2 screw tips are in the lateral canal, chronic given there is no signs of hardware fracture or displacement. There is dextroscoliosis and multilevel listhesis effusion. No gross canal stenosis or hematoma. No prevertebral edema or acute fracture. There is remote T2 spinous process fracture Advanced adjacent segment disc degeneration at C7-T1 with complete disc loss. There is left foramen T1-T2 gas of doubtful  clinical significance. IMPRESSION: 1. No acute intracranial or cervical spine finding. 2. 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 3. No cortical finding to explain seizure. Electronically Signed   By: Monte Fantasia M.D.   On: 07/06/2015 06:23   Ct Abdomen Pelvis W Contrast  07/06/2015  CLINICAL DATA:  Fall.  Gross hematuria.  Unresponsive. EXAM: CT CHEST, ABDOMEN AND PELVIS WITHOUT CONTRAST TECHNIQUE: Multidetector CT imaging of the chest, abdomen and pelvis was performed following the standard protocol without IV contrast. COMPARISON:  01/31/2015 FINDINGS: CT CHEST FINDINGS THORACIC INLET/BODY WALL: No acute abnormality. MEDIASTINUM: Normal heart size. No pericardial effusion. No acute vascular abnormality. No adenopathy. Extensive atherosclerosis, including the coronary arteries. LUNG WINDOWS: No contusion, hemothorax, or pneumothorax.  Dependent atelectasis. OSSEOUS: See below CT ABDOMEN AND PELVIS FINDINGS BODY WALL: Unremarkable. Hepatobiliary: No focal liver abnormality.Cholelithiasis. Pancreas: Fatty atrophy.  No acute finding. Spleen: Unremarkable. Adrenals/Urinary Tract: Negative adrenals. Bilateral renal cysts. 22 mm soft tissue density mass exophytic from the interpolar right kidney is indeterminate without precontrast imaging. Appearance is stable from prior. There is an additional 10 mm soft tissue density lesion in the left kidney, also stable. No hydronephrosis or stone. Unremarkable bladder- no clot seen within the urinary bladder. Reproductive:No pathologic findings. Stomach/Bowel: No evidence of injury. Colonic diverticulosis. Large stool volume without obstruction or impaction. Vascular/Lymphatic: Atherosclerosis. No acute vascular abnormality. No mass or adenopathy. Peritoneal: No ascites or pneumoperitoneum. Musculoskeletal: Negative for fracture. There are bilateral chronic rib fractures with callus. Severe lumbar spine degenerative disease  status post decompressive laminectomy. There is calcified arachnoiditis at the operative lumbar levels. Lumbar dextroscoliosis from asymmetric disc narrowing and vertebral body wedging. There are chronic bilateral pars defects at L4 and L5 with grade 1 anterolisthesis at L4-5. IMPRESSION: 1. No acute finding. 2. Complex cysts or solid renal masses in the bilateral kidneys are stable from 2016, the larger on the right measuring 22 mm. If the patient is a treatment candidate, renal MRI could be used to differentiate. MRI would be most diagnostic when an outpatient at baseline. 3. Cholelithiasis. Electronically Signed   By: Monte Fantasia M.D.   On: 07/06/2015 06:43    Microbiology: No results found for this or any previous visit (from the past 240 hour(s)).   Labs: Basic Metabolic Panel:  Recent Labs Lab 07/06/15 0445 07/06/15 0500 07/06/15 1333 07/07/15 0606  NA 139 141  --  141  K 3.6 3.6  --  4.3  CL 101 101  --  105  CO2 16*  --   --  24  GLUCOSE 221* 208*  --  93  BUN 32* 33*  --  18  CREATININE 1.89* 1.60*  --  1.22  CALCIUM 9.1  --   --  8.7*  MG 2.1  --   --   --   PHOS  --   --  3.2  --    Liver Function Tests:  Recent Labs Lab 07/06/15 0445 07/07/15 0606  AST 49* 88*  ALT 38 38  ALKPHOS 60 56  BILITOT 0.5 1.3*  PROT 6.3* 5.9*  ALBUMIN 3.9 3.7   No results for input(s): LIPASE, AMYLASE in the last 168 hours. No results for input(s): AMMONIA in the last 168 hours. CBC:  Recent Labs Lab 07/06/15 0445 07/06/15 0500 07/07/15 0606  WBC 13.8*  --  9.1  NEUTROABS 9.1*  --   --   HGB 12.5* 13.9 12.7*  HCT 39.9 41.0 39.2  MCV 98.8  --  94.5  PLT 260  --  195   Cardiac Enzymes:  Recent Labs Lab 07/06/15 0445  CKTOTAL 275   BNP: BNP (last 3 results) No results for input(s): BNP in the last 8760 hours.  ProBNP (last 3 results) No results for input(s): PROBNP in the last 8760 hours.  CBG:  Recent Labs Lab 07/07/15 1937 07/08/15 0818 07/08/15 1159  07/08/15 1646 07/08/15 2127  GLUCAP 104* 74 117* 119* 110*

## 2015-07-20 ENCOUNTER — Ambulatory Visit (INDEPENDENT_AMBULATORY_CARE_PROVIDER_SITE_OTHER): Payer: Medicare Other | Admitting: Neurology

## 2015-07-20 ENCOUNTER — Encounter: Payer: Self-pay | Admitting: Neurology

## 2015-07-20 VITALS — BP 142/70 | HR 100 | Ht 70.0 in | Wt 194.5 lb

## 2015-07-20 DIAGNOSIS — R569 Unspecified convulsions: Secondary | ICD-10-CM

## 2015-07-20 DIAGNOSIS — Z5181 Encounter for therapeutic drug level monitoring: Secondary | ICD-10-CM | POA: Diagnosis not present

## 2015-07-20 NOTE — Progress Notes (Signed)
Reason for visit: Seizures   [name] is a 82 y.o. male  History of present illness:  [name] is a 82 year old right-handed white male with a history of episodes of staring and automatic behavior. The patient has been previously seen through this office, he was placed on Keppra in low dose taking 250 mg twice daily. Apparently, the patient felt that he could not tolerate the medication in part secondary to drowsiness. The patient stopped the medication, and he was admitted to the hospital on 07/06/2015 with recurring seizure events that were witnessed. The patient was found on the floor around 4 AM by his wife moaning. The patient had fallen onto his right side with subsequent bruising of the right chest. The patient had 2 witnessed seizures, one in route to the hospital and a second while in the emergency room. The patient received IV Ativan, and was admitted to the hospital. A CT scan of the head and neck showed no acute changes. The patient underwent an EEG study showing some irritability in the left brain. The patient was taken off of Keppra, and placed on Depakote, currently on 750 mg twice daily. The patient once again has side effects on the drug with significant daytime drowsiness. Previously, the patient had reported problems with insomnia but he now sleeps well at night, but also sleeps off and on throughout the day. The patient has not had any further seizures since discharge. The patient has an underlying memory disorder that has gradually worsened over time. The patient is having increasing problems with keeping up with medications, he has difficulty with chronology of events. The patient reports some problems with double vision since the hospitalization that is horizontal in nature, he believes that it is worse at close objects rather than distance. He has not noted any new numbness or weakness of the face, arms, or legs. He does have chronic issues with back pain that has worsened  since the seizure, and right leg weakness. He reports numbness in the feet and in the hands that is chronic. He walks with a walker secondary to gait instability.  Past Medical History  Diagnosis Date  . Hypothyroidism   . Vertigo     hx of  . Constipation     from medications taken  . GERD (gastroesophageal reflux disease)   . Arthritis   . Cancer ([place])     skin CA removed from back  . Hypercholesteremia   . Hypertension   . Bruises easily   . Complication of anesthesia     trouble waking up  . Osteoarthritis   . Chronic insomnia 03/30/2015  . Transient alteration of awareness 03/30/2015    Past Surgical History  Procedure Laterality Date  . Tonsillectomy    . Hernia repair    . Knee arthroplasty      right knee X 2; left knee once  . Laminectomy      X 6  . Back surgery    . Eye surgery      Bilateral Cataract surgery   . Cardiac catheterization      Stent X 1  . Coronary angioplasty    . Cervix surgery    . Posterior cervical fusion/foraminotomy  01/28/2012    Procedure: POSTERIOR CERVICAL FUSION/FORAMINOTOMY LEVEL 3;  Surgeon: [name], MD;  Location: [facility];  Service: Neurosurgery;  Laterality: Left;  Posterior Cervical Five-Thoracic One Fusion, Arthrodesis with LEFT Cervical Seven-thoracic One Laminectomy, Foraminotomy and Resection of Synovial  Cyst  . Posterior cervical fusion/foraminotomy N/A 01/29/2013    Procedure: POSTERIOR CERVICAL FUSION/FORAMINOTOMY LEVEL 1 and C2-5 Posteriolateral Arthrodesis;  Surgeon: [name], MD;  Location: [facility];  Service: Neurosurgery;  Laterality: N/A;  C2-C3 Laminectomy C2-C3 posterior cervical arthrodesis  . I&d extremity Right 05/10/2015    Procedure: IRRIGATION AND DEBRIDEMENT EXTREMITY;  Surgeon: Leanora Cover, MD;  Location: [place];  Service: Orthopedics;  Laterality: Right;    Family History  Problem Relation Age of Onset  . Hypertension Mother   . Cancer Mother   . Kidney  failure Father   . Heart disease Father     Social history:  reports that he has quit smoking. He has never used smokeless tobacco. He reports that he drinks alcohol. He reports that he does not use illicit drugs.  Medications:  Prior to Admission medications   Medication Sig Start Date End Date Taking? Authorizing Provider  alendronate (FOSAMAX) 70 MG tablet Take 70 mg by mouth Once a week. Sundays 01/06/12  Yes Historical Provider, MD  amLODipine (NORVASC) 5 MG tablet Take 5 mg by mouth Daily.  12/23/11  Yes Historical Provider, MD  aspirin EC 81 MG tablet Take 81 mg by mouth at bedtime.   Yes Historical Provider, MD  atenolol (TENORMIN) 25 MG tablet Take 25 mg by mouth daily.   Yes Historical Provider, MD  calcium carbonate (OS-CAL) 600 MG TABS tablet Take 600 mg by mouth 2 (two) times daily with a meal.   Yes Historical Provider, MD  Cholecalciferol (VITAMIN D-3) 1000 units CAPS Take 1,000 Units by mouth 2 (two) times daily.   Yes Historical Provider, MD  Coenzyme Q10 (COQ10) 200 MG CAPS Take 200 mg by mouth at bedtime.   Yes Historical Provider, MD  cyanocobalamin 500 MCG tablet Take 500 mcg by mouth at bedtime.   Yes Historical Provider, MD  divalproex (DEPAKOTE) 250 MG DR tablet Take 3 tablets (750 mg total) by mouth every 12 (twelve) hours. 07/09/15  Yes Robbie Lis, MD  docusate sodium (COLACE) 100 MG capsule Take 100 mg by mouth 2 (two) times daily.    Yes Historical Provider, MD  gabapentin (NEURONTIN) 300 MG capsule Take 300 mg by mouth 2 (two) times daily.   Yes Historical Provider, MD  hydrochlorothiazide (HYDRODIURIL) 25 MG tablet Take 25 mg by mouth daily.   Yes Historical Provider, MD  HYDROcodone-acetaminophen (NORCO) 10-325 MG tablet Take 1 tablet by mouth every 4 (four) hours as needed for moderate pain.   Yes Historical Provider, MD  levothyroxine (SYNTHROID, LEVOTHROID) 150 MCG tablet Take 150 mcg by mouth daily before breakfast. 12/27/11  Yes Historical Provider, MD    losartan (COZAAR) 100 MG tablet Take 1 tablet (100 mg total) by mouth daily. 02/08/15  Yes Reyne Dumas, MD  Multiple Vitamin (MULTIVITAMIN WITH MINERALS) TABS tablet Take 1 tablet by mouth at bedtime.   Yes Historical Provider, MD  nitroGLYCERIN (NITROSTAT) 0.4 MG SL tablet Place 0.4 mg under the tongue every 5 (five) minutes as needed for chest pain.   Yes Historical Provider, MD  omeprazole (PRILOSEC) 20 MG capsule Take 20 mg by mouth Daily. 12/23/11  Yes Historical Provider, MD  polyethylene glycol (MIRALAX / GLYCOLAX) packet Take 8.5 g by mouth daily as needed for mild constipation.   Yes Historical Provider, MD  pravastatin (PRAVACHOL) 40 MG tablet Take 40 mg by mouth daily. 10/29/14  Yes Historical Provider, MD  predniSONE (DELTASONE) 10 MG tablet Take 10 mg  by mouth Daily.  01/06/12  Yes Historical Provider, MD  senna (SENOKOT) 8.6 MG tablet Take 1 tablet by mouth daily.    Yes Historical Provider, MD  sulindac (CLINORIL) 200 MG tablet Take 200 mg by mouth 2 (two) times daily.   Yes Historical Provider, MD  ZETIA 10 MG tablet Take 5 mg by mouth at bedtime.  10/15/11  Yes Historical Provider, MD      Allergies  Allergen Reactions  . Demerol [Meperidine] Nausea And Vomiting    ROS:  Out of a complete 14 system review of symptoms, the patient complains only of the following symptoms, and all other reviewed systems are negative.  Seizure Back pain, right leg pain Leg weakness, right side Memory disorder  Blood pressure 142/70, pulse 100, height 5\' 10"  (1.778 m), weight 194 lb 8 oz (88.225 kg).  Physical Exam  General: The patient is alert and cooperative at the time of the examination. The patient is moderately obese.  Eyes: Pupils are equal, round, and reactive to light. Discs are flat bilaterally.  Neck: The neck is supple, no carotid bruits are noted.  Respiratory: The respiratory examination is clear.  Cardiovascular: The cardiovascular examination reveals a regular rate  and rhythm, no obvious murmurs or rubs are noted.  Skin: Extremities are without significant edema.  Neurologic Exam  Mental status: The patient is alert and oriented x 3 at the time of the examination.   Cranial nerves: Facial symmetry is present. There is good sensation of the face to pinprick and soft touch bilaterally. The strength of the facial muscles and the muscles to head turning and shoulder shrug are normal bilaterally. Speech is well enunciated, no aphasia or dysarthria is noted. Extraocular movements are full. Visual fields are full. The tongue is midline, and the patient has symmetric elevation of the soft palate. No obvious hearing deficits are noted.  Motor: The motor testing reveals 5 over 5 strength of all 4 extremities, with exception of some slight weakness of intrinsic muscles on the left hand, prominent foot drop on the right foot, with weakness of the right foot with inversion, eversion, and plantar flexion. The patient is a mild foot drop on the left. Good symmetric motor tone is noted throughout.  Sensory: Sensory testing is intact to pinprick, soft touch, vibration sensation, and position sense on all 4 extremities, with exception of a stocking pattern pinprick sensory deficit one half way up the legs bilaterally, with decreased pinprick sensation on the right foot relative to the left, and decreased vibration sensation on the right foot relative to the left, and position sense reduction bilaterally, more prominent on the right foot. No evidence of extinction is noted.  Coordination: Cerebellar testing reveals good finger-nose-finger and heel-to-shin bilaterally.  Gait and station: Gait is wide-based, the patient uses a walker for ambulation. Romberg is unsteady, the patient has a tendency to fall backwards. Tandem gait was not attempted.  Reflexes: Deep tendon reflexes are symmetric, but are depressed bilaterally. Toes are downgoing bilaterally.   CT head and cervical  07/06/15:  IMPRESSION: 1. No acute intracranial or cervical spine finding. 2. 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 3. No cortical finding to explain seizure.  * CT scan images were reviewed online. I agree with the written report.    Assessment/Plan:  1. History of seizures with recent recurrence  2. Memory disorder  3. Chronic low back pain, right leg pain and weakness  4. Gait disorder  5. Peripheral  neuropathy by clinical examination  6. Double vision, new onset  The patient currently is on Depakote, he reports excessive daytime drowsiness on the medication. We will check blood levels today, the patient is to alter his dose now taking 500 mg in the morning and 1000 mg in the evening. The patient may require another type of seizure medication. The patient follow-up in 2 months. We will need to follow the memory issues over time, the patient may require Namenda in the future. The patient has an AFO brace for the right foot drop, but he does not use it. We will need to follow the double vision issue.  Jill Alexanders MD 07/20/2015 8:07 PM  [facility] [address] [place], [place] [address]  Phone [phone] Fax [phone]

## 2015-07-20 NOTE — Patient Instructions (Addendum)
Take the Depakote 250 mg tablets, 2 in the morning and 4 in the evening.  Epilepsy Epilepsy is a disorder in which a person has repeated seizures over time. A seizure is a release of abnormal electrical activity in the brain. Seizures can cause a change in attention, behavior, or the ability to remain awake and alert (altered mental status). Seizures often involve uncontrollable shaking (convulsions).  Most people with epilepsy lead normal lives. However, people with epilepsy are at an increased risk of falls, accidents, and injuries. Therefore, it is important to begin treatment right away. CAUSES  Epilepsy has many possible causes. Anything that disturbs the normal pattern of brain cell activity can lead to seizures. This may include:   Head injury.  Birth trauma.  High fever as a child.  Stroke.  Bleeding into or around the brain.  Certain drugs.  Prolonged low oxygen, such as what occurs after CPR efforts.  Abnormal brain development.  Certain illnesses, such as meningitis, encephalitis (brain infection), malaria, and other infections.  An imbalance of nerve signaling chemicals (neurotransmitters).  SIGNS AND SYMPTOMS  The symptoms of a seizure can vary greatly from one person to another. Right before a seizure, you may have a warning (aura) that a seizure is about to occur. An aura may include the following symptoms:  Fear or anxiety.  Nausea.  Feeling like the room is spinning (vertigo).  Vision changes, such as seeing flashing lights or spots. Common symptoms during a seizure include:  Abnormal sensations, such as an abnormal smell or a bitter taste in the mouth.   Sudden, general body stiffness.   Convulsions that involve rhythmic jerking of the face, arm, or leg on one or both sides.   Sudden change in consciousness.   Appearing to be awake but not responding.   Appearing to be asleep but cannot be awakened.   Grimacing, chewing, lip smacking,  drooling, tongue biting, or loss of bowel or bladder control. After a seizure, you may feel sleepy for a while. DIAGNOSIS  Your health care provider will ask about your symptoms and take a medical history. Descriptions from any witnesses to your seizures will be very helpful in the diagnosis. A physical exam, including a detailed neurological exam, is necessary. Various tests may be done, such as:   An electroencephalogram (EEG). This is a painless test of your brain waves. In this test, a diagram is created of your brain waves. These diagrams can be interpreted by a specialist.  An MRI of the brain.   A CT scan of the brain.   A spinal tap (lumbar puncture, LP).  Blood tests to check for signs of infection or abnormal blood chemistry. TREATMENT  There is no cure for epilepsy, but it is generally treatable. Once epilepsy is diagnosed, it is important to begin treatment as soon as possible. For most people with epilepsy, seizures can be controlled with medicines. The following may also be used:  A pacemaker for the brain (vagus nerve stimulator) can be used for people with seizures that are not well controlled by medicine.  Surgery on the brain. For some people, epilepsy eventually goes away. HOME CARE INSTRUCTIONS   Follow your health care provider's recommendations on driving and safety in normal activities.  Get enough rest. Lack of sleep can cause seizures.  Only take over-the-counter or prescription medicines as directed by your health care provider. Take any prescribed medicine exactly as directed.  Avoid any known triggers of your seizures.  Keep  a seizure diary. Record what you recall about any seizure, especially any possible trigger.   Make sure the people you live and work with know that you are prone to seizures. They should receive instructions on how to help you. In general, a witness to a seizure should:   Cushion your head and body.   Turn you on your side.    Avoid unnecessarily restraining you.   Not place anything inside your mouth.   Call for emergency medical help if there is any question about what has occurred.   Follow up with your health care provider as directed. You may need regular blood tests to monitor the levels of your medicine.  SEEK MEDICAL CARE IF:   You develop signs of infection or other illness. This might increase the risk of a seizure.   You seem to be having more frequent seizures.   Your seizure pattern is changing.  SEEK IMMEDIATE MEDICAL CARE IF:   You have a seizure that does not stop after a few moments.   You have a seizure that causes any difficulty in breathing.   You have a seizure that results in a very severe headache.   You have a seizure that leaves you with the inability to speak or use a part of your body.    This information is not intended to replace advice given to you by your health care provider. Make sure you discuss any questions you have with your health care provider.   Document Released: 02/26/2005 Document Revised: 12/17/2012 Document Reviewed: 10/08/2012 Elsevier Interactive Patient Education [employer].

## 2015-07-21 ENCOUNTER — Telehealth: Payer: Self-pay | Admitting: Neurology

## 2015-07-21 ENCOUNTER — Ambulatory Visit: Payer: Medicare Other | Admitting: Neurology

## 2015-07-21 LAB — COMPREHENSIVE METABOLIC PANEL
ALT: 21 IU/L (ref 0–44)
AST: 23 IU/L (ref 0–40)
Albumin/Globulin Ratio: 1.9 (ref 1.2–2.2)
Albumin: 4 g/dL (ref 3.5–4.8)
Alkaline Phosphatase: 68 IU/L (ref 39–117)
BUN/Creatinine Ratio: 26 — ABNORMAL HIGH (ref 10–24)
BUN: 44 mg/dL — ABNORMAL HIGH (ref 8–27)
Bilirubin Total: 0.4 mg/dL (ref 0.0–1.2)
CO2: 26 mmol/L (ref 18–29)
Calcium: 8.8 mg/dL (ref 8.6–10.2)
Chloride: 105 mmol/L (ref 96–106)
Creatinine, Ser: 1.72 mg/dL — ABNORMAL HIGH (ref 0.76–1.27)
GFR calc Af Amer: 43 mL/min/{1.73_m2} — ABNORMAL LOW (ref >59)
GFR calc non Af Amer: 37 mL/min/{1.73_m2} — ABNORMAL LOW (ref >59)
Globulin, Total: 2.1 g/dL (ref 1.5–4.5)
Glucose: 146 mg/dL — ABNORMAL HIGH (ref 65–99)
Potassium: 4.1 mmol/L (ref 3.5–5.2)
Sodium: 148 mmol/L — ABNORMAL HIGH (ref 134–144)
Total Protein: 6.1 g/dL (ref 6.0–8.5)

## 2015-07-21 LAB — CBC WITH DIFFERENTIAL/PLATELET
Basophils Absolute: 0.1 10*3/uL (ref 0.0–0.2)
Basos: 1 %
EOS (ABSOLUTE): 0.2 10*3/uL (ref 0.0–0.4)
Eos: 2 %
Hematocrit: 35.2 % — ABNORMAL LOW (ref 37.5–51.0)
Hemoglobin: 11.2 g/dL — ABNORMAL LOW (ref 12.6–17.7)
Immature Grans (Abs): 0.3 10*3/uL — ABNORMAL HIGH (ref 0.0–0.1)
Immature Granulocytes: 4 %
Lymphocytes Absolute: 1.3 10*3/uL (ref 0.7–3.1)
Lymphs: 16 %
MCH: 31.1 pg (ref 26.6–33.0)
MCHC: 31.8 g/dL (ref 31.5–35.7)
MCV: 98 fL — ABNORMAL HIGH (ref 79–97)
Monocytes Absolute: 0.9 10*3/uL (ref 0.1–0.9)
Monocytes: 11 %
Neutrophils Absolute: 5.4 10*3/uL (ref 1.4–7.0)
Neutrophils: 66 %
Platelets: 185 10*3/uL (ref 150–379)
RBC: 3.6 x10E6/uL — ABNORMAL LOW (ref 4.14–5.80)
RDW: 14.6 % (ref 12.3–15.4)
WBC: 8.1 10*3/uL (ref 3.4–10.8)

## 2015-07-21 LAB — AMMONIA: Ammonia: 60 ug/dL (ref 27–102)

## 2015-07-21 LAB — VALPROIC ACID LEVEL: Valproic Acid Lvl: 73 ug/mL (ref 50–100)

## 2015-07-21 NOTE — Telephone Encounter (Signed)
I called patient. The blood work shows chronic renal insufficiency, slightly elevated sodium level the patient may be slightly dehydrated. He has a chronic mild anemia that may be related to the kidney function issue. The patient has therapeutic Depakote levels.

## 2015-07-22 ENCOUNTER — Telehealth: Payer: Self-pay | Admitting: Neurology

## 2015-07-22 NOTE — Telephone Encounter (Signed)
Pt/wife reported that they requested refill on Depakote from PCP office and they picked up 250 mg ER TID. Dr. Jannifer Franklin had instructed pt to continue Depakote DR 500 mg in the am and 1000 mg in the pm. Pharmacist told pt's wife that there was a difference between the ER and DR medication. They cannot return medication as they've already picked it up. Spoke to pharmacy who suggested that pt take ER medication only once a day. Discussed w/ Dr. Felecia Shelling for recommended dosing and he said that pt should continue 500 mg daily in the morning and 1000 mg at night of Depakote ER. Called pt/wife with dosing instructions of 2 tabs in the morning and 4 tabs at bedtime. Verbalized understanding and appreciation for call.

## 2015-07-22 NOTE — Telephone Encounter (Signed)
Message For: EMAIL TO [facility]         Taken 12-MAY-17 at  8:04AM by HLJ ------------------------------------------------------------  Caller  [name]/WIFE               CID  [id number]   Patient  [name],[name]          Pt's Dr  Jannifer Franklin        Area Code  336  Phone#  [phone]  08 11 [place] C/B TO GET DIRECTIONS FOR RX-COULDN'T       UNDERSTAND THEM WHEN TOLD BEFORE                      Disp:Y/N  Y  If Y = C/B If No Response In 39minutes

## 2015-08-01 NOTE — Progress Notes (Signed)
DATE: 02/14/15  Location:  [facility]    Place of Service: SNF (31)   Extended Emergency Contact Information Primary Emergency Contact: Earnshaw,[name] H Address: [address]          [place] [address] [place] Phone: [phone] Mobile Phone: [phone] Relation: Spouse  Advanced Directive information Does patient have an advance directive?: Yes, Type of Advance Directive: Out of facility DNR (pink MOST or yellow form)  Chief Complaint  Patient presents with  . Discharge Note    HPI:  82 yo male seen today for discharge from SNF followng short term rehab. He completed tx. He will be d/c'd home with [facility] PT/OT and 3-in-1 commode. He c/o pain uncontrolled and has been unable to sleep x 3 days due to pain. No other concerns. No nursing issues. No falls.   BRIEF SUMMARY: He was admitted to the hospital for acute encephalopathy, sigmoid diverticulitis, leg pain with hx lumbar spinal stenosis, HTN, elevated liver enzymes. He was d/c'd on flagyl/cipro to take for 7 additional days.   Thyroid - stable on synthroid  HTN - BP controlled on losartan, HCTZ, atenolol, amlodipine. He takes ASA daily He takes zetia and pravachol for cholesteol  GERD/constipation - constipation stable on colace, miralax and senna. He takes omeprazole  Chronic back pain/arthritis - diclofenac tabs, norco, hysingla gabapentin and prednisone  Past Medical History  Diagnosis Date  . Hypothyroidism   . Vertigo     hx of  . Constipation     from medications taken  . GERD (gastroesophageal reflux disease)   . Arthritis   . Cancer ([place])     skin CA removed from back  . Hypercholesteremia   . Hypertension   . Bruises easily   . Complication of anesthesia     trouble waking up  . Osteoarthritis   . Chronic insomnia 03/30/2015  . Transient alteration of awareness 03/30/2015    Past Surgical History  Procedure Laterality Date  . Tonsillectomy    . Hernia  repair    . Knee arthroplasty      right knee X 2; left knee once  . Laminectomy      X 6  . Back surgery    . Eye surgery      Bilateral Cataract surgery   . Cardiac catheterization      Stent X 1  . Coronary angioplasty    . Cervix surgery    . Posterior cervical fusion/foraminotomy  01/28/2012    Procedure: POSTERIOR CERVICAL FUSION/FORAMINOTOMY LEVEL 3;  Surgeon: [name], MD;  Location: [place] [facility];  Service: Neurosurgery;  Laterality: Left;  Posterior Cervical Five-Thoracic One Fusion, Arthrodesis with LEFT Cervical Seven-thoracic One Laminectomy, Foraminotomy and Resection of Synovial Cyst  . Posterior cervical fusion/foraminotomy N/A 01/29/2013    Procedure: POSTERIOR CERVICAL FUSION/FORAMINOTOMY LEVEL 1 and C2-5 Posteriolateral Arthrodesis;  Surgeon: [name], MD;  Location: [place] [facility];  Service: Neurosurgery;  Laterality: N/A;  C2-C3 Laminectomy C2-C3 posterior cervical arthrodesis  . I&d extremity Right 05/10/2015    Procedure: IRRIGATION AND DEBRIDEMENT EXTREMITY;  Surgeon: Leanora Cover, MD;  Location: [place];  Service: Orthopedics;  Laterality: Right;    Patient Care Team: [name], MD as PCP - General (Internal Medicine)  Social History   Social History  . Marital Status: Married    Spouse Name: [name]  . Number of Children: 3  . Years of Education: 16   Occupational  History  . retired Software engineer    Social History Main Topics  . Smoking status: Former Research scientist (life sciences)  . Smokeless tobacco: Never Used  . Alcohol Use: Yes     Comment: rare  . Drug Use: No  . Sexual Activity: Not on file   Other Topics Concern  . Not on file   Social History Narrative   Lives at home w/ his wife [name]   Patient drinks 4-5 cups of coffee daily.   Patient is right handed.      reports that he has quit smoking. He has never used smokeless tobacco. He reports that he drinks alcohol. He reports that he does not use illicit  drugs.  Immunization History  Administered Date(s) Administered  . Influenza-Unspecified 11/10/2012    Allergies  Allergen Reactions  . Demerol [Meperidine] Nausea And Vomiting    Medications: Patient's Medications  New Prescriptions   DIVALPROEX (DEPAKOTE) 250 MG DR TABLET    Take 3 tablets (750 mg total) by mouth every 12 (twelve) hours.  Previous Medications   ALENDRONATE (FOSAMAX) 70 MG TABLET    Take 70 mg by mouth Once a week. Sundays   AMLODIPINE (NORVASC) 5 MG TABLET    Take 5 mg by mouth Daily.    ASPIRIN EC 81 MG TABLET    Take 81 mg by mouth at bedtime.   ATENOLOL (TENORMIN) 25 MG TABLET    Take 25 mg by mouth daily.   CALCIUM CARBONATE (OS-CAL) 600 MG TABS TABLET    Take 600 mg by mouth 2 (two) times daily with a meal.   CHOLECALCIFEROL (VITAMIN D-3) 1000 UNITS CAPS    Take 1,000 Units by mouth 2 (two) times daily.   COENZYME Q10 (COQ10) 200 MG CAPS    Take 200 mg by mouth at bedtime.   CYANOCOBALAMIN 500 MCG TABLET    Take 500 mcg by mouth at bedtime.   DOCUSATE SODIUM (COLACE) 100 MG CAPSULE    Take 100 mg by mouth 2 (two) times daily.    GABAPENTIN (NEURONTIN) 300 MG CAPSULE    Take 300 mg by mouth 2 (two) times daily.   HYDROCHLOROTHIAZIDE (HYDRODIURIL) 25 MG TABLET    Take 25 mg by mouth daily.   HYDROCODONE-ACETAMINOPHEN (NORCO) 10-325 MG TABLET    Take 1 tablet by mouth every 4 (four) hours as needed for moderate pain.   LEVOTHYROXINE (SYNTHROID, LEVOTHROID) 150 MCG TABLET    Take 150 mcg by mouth daily before breakfast.   LOSARTAN (COZAAR) 100 MG TABLET    Take 1 tablet (100 mg total) by mouth daily.   MULTIPLE VITAMIN (MULTIVITAMIN WITH MINERALS) TABS TABLET    Take 1 tablet by mouth at bedtime.   NITROGLYCERIN (NITROSTAT) 0.4 MG SL TABLET    Place 0.4 mg under the tongue every 5 (five) minutes as needed for chest pain. Reported on 07/20/2015   OMEPRAZOLE (PRILOSEC) 20 MG CAPSULE    Take 20 mg by mouth Daily.   POLYETHYLENE GLYCOL (MIRALAX / GLYCOLAX) PACKET     Take 8.5 g by mouth daily as needed for mild constipation.   PRAVASTATIN (PRAVACHOL) 40 MG TABLET    Take 40 mg by mouth daily.   PREDNISONE (DELTASONE) 10 MG TABLET    Take 10 mg by mouth Daily.    SENNA (SENOKOT) 8.6 MG TABLET    Take 1 tablet by mouth daily.    SULINDAC (CLINORIL) 200 MG TABLET    Take 200 mg by mouth 2 (two) times daily.  ZETIA 10 MG TABLET    Take 5 mg by mouth at bedtime.   Modified Medications   No medications on file  Discontinued Medications   ATENOLOL (TENORMIN) 50 MG TABLET    Take 25 mg by mouth Daily.    CALCIUM CARBONATE (OS-CAL) 600 MG TABS    Take 600 mg by mouth 2 (two) times daily.   CHOLECALCIFEROL (VITAMIN D) 1000 UNITS TABLET    Take 1,000 Units by mouth at bedtime.   CIPROFLOXACIN (CIPRO) 500 MG TABLET    Take 1 tablet (500 mg total) by mouth 2 (two) times daily.   COENZYME Q10 (CO Q 10) 100 MG CAPS    Take 100 mg by mouth at bedtime.   DICLOFENAC (VOLTAREN) 50 MG EC TABLET    Take 50 mg by mouth Twice daily.   GABAPENTIN (NEURONTIN) 600 MG TABLET    Take 600 mg by mouth 3 (three) times daily.   HYDROCODONE BITARTRATE ER (HYSINGLA ER) 40 MG T24A    Take 1 tablet by mouth daily   HYDROCODONE-ACETAMINOPHEN (NORCO) 10-325 MG TABLET    Take one tablet by mouth three times daily as needed for breakthrough pain   HYDROMORPHONE (DILAUDID) 2 MG TABLET    Take 1 tablet (2 mg total) by mouth every 6 (six) hours as needed for severe pain.   LORAZEPAM (ATIVAN) 0.5 MG TABLET    Take 1 tablet (0.5 mg total) by mouth at bedtime.   METRONIDAZOLE (FLAGYL) 500 MG TABLET    Take 1 tablet (500 mg total) by mouth every 8 (eight) hours.   MULTIPLE VITAMIN (MULTIVITAMIN WITH MINERALS) TABS    Take 1 tablet by mouth at bedtime.   OXYMETAZOLINE (AFRIN) 0.05 % NASAL SPRAY    Place 1 spray into both nostrils 2 (two) times daily.   POLYETHYLENE GLYCOL POWDER (GLYCOLAX/MIRALAX) POWDER    Take 17 g by mouth Once daily as needed. For constipation   POTASSIUM CHLORIDE (K-DUR) 10 MEQ  TABLET    Take 1 tablet (10 mEq total) by mouth 2 (two) times daily.   SACCHAROMYCES BOULARDII (FLORASTOR) 250 MG CAPSULE    Take 250 mg by mouth 2 (two) times daily.   TROLAMINE SALICYLATE (ASPERCREME) 10 % CREAM    Apply 1 application topically as needed for muscle pain.    Review of Systems  Musculoskeletal: Positive for back pain, arthralgias and gait problem.  All other systems reviewed and are negative.   Filed Vitals:   02/14/15 1404  BP: 132/74  Pulse: 58  Temp: 96 F (35.6 C)  TempSrc: Oral  Resp: 15  Height: 5\' 11"  (1.803 m)  Weight: 197 lb 9.6 oz (89.631 kg)   Body mass index is 27.57 kg/(m^2).  Physical Exam  Constitutional: He is oriented to person, place, and time. He appears well-developed and well-nourished.  Neurological: He is alert and oriented to person, place, and time.  Psychiatric: He has a normal mood and affect. His behavior is normal. Thought content normal.     Labs reviewed: Admission on 01/31/2015, Discharged on 02/05/2015  No results displayed because visit has over 200 results.        Assessment/Plan   ICD-9-CM ICD-10-CM   1. Chronic back pain 724.5 M54.9    338.29 G89.29   2. Constipation, unspecified constipation type 564.00 K59.00   3. Essential hypertension 401.9 I10   4. Hypothyroidism due to acquired atrophy of thyroid 244.8 E03.8    246.8 E03.4   5. Gastroesophageal reflux disease  without esophagitis 530.81 K21.9    Increase hysingla er to 60mg  #30 take 1 po daily  Patient is being discharged with home health services:  PT/OT  Patient is being discharged with the following durable medical equipment:   3-in-1 commode  Patient has been advised to f/u with their PCP in 1-2 weeks to bring them up to date on their rehab stay.  They were provided with a 30 day supply of scripts for prescription medications and refills must be obtained from their PCP. rx Norco #60 take 1 po TID prn pain.   TIME SPENT (MINUTES): [place].  Perlie Gold  [facility] [address] [place], [place] [address] [phone] Cell (Monday-Friday 8 AM - 5 PM) [phone] After 5 PM and follow prompts

## 2015-09-22 ENCOUNTER — Ambulatory Visit (INDEPENDENT_AMBULATORY_CARE_PROVIDER_SITE_OTHER): Payer: Medicare Other | Admitting: Neurology

## 2015-09-22 ENCOUNTER — Encounter: Payer: Self-pay | Admitting: Neurology

## 2015-09-22 VITALS — BP 136/76 | HR 78 | Ht 70.0 in | Wt 195.0 lb

## 2015-09-22 DIAGNOSIS — R269 Unspecified abnormalities of gait and mobility: Secondary | ICD-10-CM

## 2015-09-22 DIAGNOSIS — R569 Unspecified convulsions: Secondary | ICD-10-CM

## 2015-09-22 DIAGNOSIS — R413 Other amnesia: Secondary | ICD-10-CM | POA: Diagnosis not present

## 2015-09-22 HISTORY — DX: Unspecified abnormalities of gait and mobility: R26.9

## 2015-09-22 HISTORY — DX: Other amnesia: R41.3

## 2015-09-22 NOTE — Progress Notes (Signed)
Reason for visit: Seizures  [name] is an 82 y.o. male  History of present illness:  [name] is a 82 year old right-handed white male with a history of seizure episodes. The patient has been placed on Keppra, but he could not tolerate the medication, he was taken off of this and switched to Depakote. The patient was on 1500 mg daily, but this resulted again in too much drowsiness. The dose apparently has been reduced to 750 mg at night, recent blood levels have been done by the primary care physician, I am unaware of the results of these studies. The patient continues to have some gait instability associated with a right footdrop and some right leg weakness. The patient has an AFO brace but he does not use it on a regular basis. The patient has not had any recent falls. He reports no seizures since he was admitted to the hospital on 07/06/2015. He reports that his memory and cognitive function have improved as the Depakote dose has been lowered. He does not operate a motor vehicle at this time.  Past Medical History  Diagnosis Date  . Hypothyroidism   . Vertigo     hx of  . Constipation     from medications taken  . GERD (gastroesophageal reflux disease)   . Arthritis   . Cancer ([place])     skin CA removed from back  . Hypercholesteremia   . Hypertension   . Bruises easily   . Complication of anesthesia     trouble waking up  . Osteoarthritis   . Chronic insomnia 03/30/2015  . Transient alteration of awareness 03/30/2015  . Memory difficulty 09/22/2015  . Abnormality of gait 09/22/2015    Past Surgical History  Procedure Laterality Date  . Tonsillectomy    . Hernia repair    . Knee arthroplasty      right knee X 2; left knee once  . Laminectomy      X 6  . Back surgery    . Eye surgery      Bilateral Cataract surgery   . Cardiac catheterization      Stent X 1  . Coronary angioplasty    . Cervix surgery    . Posterior cervical fusion/foraminotomy  01/28/2012   Procedure: POSTERIOR CERVICAL FUSION/FORAMINOTOMY LEVEL 3;  Surgeon: [name], MD;  Location: [place] [facility];  Service: Neurosurgery;  Laterality: Left;  Posterior Cervical Five-Thoracic One Fusion, Arthrodesis with LEFT Cervical Seven-thoracic One Laminectomy, Foraminotomy and Resection of Synovial Cyst  . Posterior cervical fusion/foraminotomy N/A 01/29/2013    Procedure: POSTERIOR CERVICAL FUSION/FORAMINOTOMY LEVEL 1 and C2-5 Posteriolateral Arthrodesis;  Surgeon: [name], MD;  Location: [place] [facility];  Service: Neurosurgery;  Laterality: N/A;  C2-C3 Laminectomy C2-C3 posterior cervical arthrodesis  . I&d extremity Right 05/10/2015    Procedure: IRRIGATION AND DEBRIDEMENT EXTREMITY;  Surgeon: Leanora Cover, MD;  Location: [place];  Service: Orthopedics;  Laterality: Right;    Family History  Problem Relation Age of Onset  . Hypertension Mother   . Cancer Mother   . Kidney failure Father   . Heart disease Father     Social history:  reports that he has quit smoking. He has never used smokeless tobacco. He reports that he drinks alcohol. He reports that he does not use illicit drugs.    Allergies  Allergen Reactions  . Demerol [Meperidine] Nausea And Vomiting  . Keppra [Levetiracetam]     sleepy  Medications:  Prior to Admission medications   Medication Sig Start Date End Date Taking? Authorizing Provider  alendronate (FOSAMAX) 70 MG tablet Take 70 mg by mouth Once a week. Sundays 01/06/12   Historical Provider, MD  amLODipine (NORVASC) 5 MG tablet Take 5 mg by mouth Daily.  12/23/11   Historical Provider, MD  aspirin EC 81 MG tablet Take 81 mg by mouth at bedtime.    Historical Provider, MD  atenolol (TENORMIN) 25 MG tablet Take 25 mg by mouth daily.    Historical Provider, MD  calcium carbonate (OS-CAL) 600 MG TABS tablet Take 600 mg by mouth 2 (two) times daily with a meal.    Historical Provider, MD  Cholecalciferol (VITAMIN D-3) 1000 units CAPS  Take 1,000 Units by mouth 2 (two) times daily.    Historical Provider, MD  Coenzyme Q10 (COQ10) 200 MG CAPS Take 200 mg by mouth at bedtime.    Historical Provider, MD  cyanocobalamin 500 MCG tablet Take 500 mcg by mouth at bedtime.    Historical Provider, MD  divalproex (DEPAKOTE) 250 MG DR tablet Take 3 tablets (750 mg total) by mouth every 12 (twelve) hours. Patient taking differently: Take 750 mg by mouth every 12 (twelve) hours. Take 2 tabs in the morning and 4 tabs at night 07/09/15   Robbie Lis, MD  docusate sodium (COLACE) 100 MG capsule Take 100 mg by mouth 2 (two) times daily.     Historical Provider, MD  gabapentin (NEURONTIN) 300 MG capsule Take 300 mg by mouth 2 (two) times daily.    Historical Provider, MD  hydrochlorothiazide (HYDRODIURIL) 25 MG tablet Take 25 mg by mouth daily.    Historical Provider, MD  HYDROcodone-acetaminophen (NORCO) 10-325 MG tablet Take 1 tablet by mouth every 4 (four) hours as needed for moderate pain.    Historical Provider, MD  levothyroxine (SYNTHROID, LEVOTHROID) 150 MCG tablet Take 150 mcg by mouth daily before breakfast. 12/27/11   Historical Provider, MD  losartan (COZAAR) 100 MG tablet Take 1 tablet (100 mg total) by mouth daily. 02/08/15   Reyne Dumas, MD  Multiple Vitamin (MULTIVITAMIN WITH MINERALS) TABS tablet Take 1 tablet by mouth at bedtime.    Historical Provider, MD  nitroGLYCERIN (NITROSTAT) 0.4 MG SL tablet Place 0.4 mg under the tongue every 5 (five) minutes as needed for chest pain. Reported on 07/20/2015    Historical Provider, MD  omeprazole (PRILOSEC) 20 MG capsule Take 20 mg by mouth Daily. 12/23/11   Historical Provider, MD  polyethylene glycol (MIRALAX / GLYCOLAX) packet Take 8.5 g by mouth daily as needed for mild constipation.    Historical Provider, MD  pravastatin (PRAVACHOL) 40 MG tablet Take 40 mg by mouth daily. 10/29/14   Historical Provider, MD  predniSONE (DELTASONE) 10 MG tablet Take 10 mg by mouth Daily.  01/06/12    Historical Provider, MD  senna (SENOKOT) 8.6 MG tablet Take 1 tablet by mouth daily.     Historical Provider, MD  sulindac (CLINORIL) 200 MG tablet Take 200 mg by mouth 2 (two) times daily.    Historical Provider, MD  ZETIA 10 MG tablet Take 5 mg by mouth at bedtime.  10/15/11   Historical Provider, MD    ROS:  Out of a complete 14 system review of symptoms, the patient complains only of the following symptoms, and all other reviewed systems are negative.  Seizures Gait instability  Blood pressure 136/76, pulse 78, height 5\' 10"  (1.778 m), weight 195 lb (88.451 kg).  Physical Exam  General: The patient is alert and cooperative at the time of the examination. The patient is moderately obese.  Skin: No significant peripheral edema is noted.   Neurologic Exam  Mental status: The patient is alert and oriented x 3 at the time of the examination. The patient has apparent normal recent and remote memory, with an apparently normal attention span and concentration ability. Mini-Mental Status Examination done today shows a total score 29/30.   Cranial nerves: Facial symmetry is present. Speech is normal, no aphasia or dysarthria is noted. Extraocular movements are full. Visual fields are full.  Motor: The patient has good strength in all 4 extremities, with exception of a prominent foot drop on the right.  Sensory examination: Soft touch sensation is symmetric on the face, arms, and legs.  Coordination: The patient has good finger-nose-finger and heel-to-shin bilaterally.  Gait and station: The patient has a steppage gait pattern on the right leg, he walks with a walker. Romberg is negative. Tandem gait was not tested.  Reflexes: Deep tendon reflexes are symmetric.   Assessment/Plan:  1. History of seizures  2. Right foot drop, gait disorder  3. Mild memory disorder  The patient is to continue the current dose of Depakote, we will try to get the results of the recent blood work  done by the primary care physician. The patient is not operating a motor vehicle at this time. He is to start using his AFO brace on a regular basis to improve gait stability. He will follow-up in 6 months, sooner if needed. He is to contact our office if any further seizures are noted.  Jill Alexanders MD 09/22/2015 8:28 PM  [facility] [address] [place], [place] [address]  Phone [phone] Fax [phone]

## 2015-09-23 ENCOUNTER — Telehealth: Payer: Self-pay | Admitting: Neurology

## 2015-09-23 ENCOUNTER — Telehealth: Payer: Self-pay

## 2015-09-23 NOTE — Telephone Encounter (Signed)
Called Dr. Shon Baton office, requested and received lab results. Placed in Dr. Jannifer Franklin' office for review.

## 2015-09-23 NOTE — Telephone Encounter (Signed)
-----   Message from Kathrynn Ducking, MD sent at 09/22/2015  8:32 PM EDT ----- Please call the office of [name] and have the most recent valproic acid level faxed to our office. Thanks.

## 2015-09-23 NOTE — Telephone Encounter (Signed)
I received the blood work on the Depakote levels, last level done was 34 on 08/29/2015. The patient has done well with the seizures on this dose, we will not alter the dosing, the wife claims that he could not tolerate even one more tablet a day because of drowsiness. The patient may need to be switched to another medication if another seizure occurs.

## 2015-10-29 ENCOUNTER — Encounter (INDEPENDENT_AMBULATORY_CARE_PROVIDER_SITE_OTHER): Payer: Self-pay

## 2015-10-29 DIAGNOSIS — M47817 Spondylosis without myelopathy or radiculopathy, lumbosacral region: Secondary | ICD-10-CM | POA: Insufficient documentation

## 2016-03-01 ENCOUNTER — Telehealth (INDEPENDENT_AMBULATORY_CARE_PROVIDER_SITE_OTHER): Payer: Self-pay | Admitting: Physical Medicine and Rehabilitation

## 2016-03-01 NOTE — Telephone Encounter (Signed)
Call not routed.

## 2016-03-09 ENCOUNTER — Ambulatory Visit (INDEPENDENT_AMBULATORY_CARE_PROVIDER_SITE_OTHER): Payer: Medicare Other | Admitting: Family

## 2016-03-09 ENCOUNTER — Ambulatory Visit (INDEPENDENT_AMBULATORY_CARE_PROVIDER_SITE_OTHER): Payer: Medicare Other

## 2016-03-09 DIAGNOSIS — M79674 Pain in right toe(s): Secondary | ICD-10-CM

## 2016-03-09 DIAGNOSIS — M2021 Hallux rigidus, right foot: Secondary | ICD-10-CM

## 2016-03-10 ENCOUNTER — Telehealth (INDEPENDENT_AMBULATORY_CARE_PROVIDER_SITE_OTHER): Payer: Self-pay | Admitting: Family

## 2016-03-10 DIAGNOSIS — M2021 Hallux rigidus, right foot: Secondary | ICD-10-CM | POA: Insufficient documentation

## 2016-03-10 LAB — HEPATIC FUNCTION PANEL
ALT: 19 U/L (ref 9–46)
AST: 29 U/L (ref 10–35)
Albumin: 4.1 g/dL (ref 3.6–5.1)
Alkaline Phosphatase: 52 U/L (ref 40–115)
Bilirubin, Direct: 0.1 mg/dL (ref <=0.2)
Indirect Bilirubin: 0.4 mg/dL (ref 0.2–1.2)
Total Bilirubin: 0.5 mg/dL (ref 0.2–1.2)
Total Protein: 5.9 g/dL — ABNORMAL LOW (ref 6.1–8.1)

## 2016-03-10 LAB — URIC ACID: Uric Acid, Serum: 6 mg/dL (ref 4.0–8.0)

## 2016-03-10 NOTE — Progress Notes (Signed)
Office Visit Note   Patient: [name]           Date of Birth: 1933/10/15           MRN: [id number] Visit Date: 03/09/2016              Requested by: [name], MD [address] [place], [place] [address] PCP: Donnajean Lopes, MD   Assessment & Plan: Visit Diagnoses:  1. Hallux rigidus, right foot   2. Great toe pain, right     Plan: We'll draw uric acid today to rule out gout. Radiographs show hallux rigidus. Recommended stiff soled walking shoes, heel cord stretching and ibuprofen for pain as needed. We will follow-up with him in 4 weeks in the office.  Follow-Up Instructions: Return in about 4 weeks (around 04/06/2016).   Orders:  Orders Placed This Encounter  Procedures  . XR Toe Great Right  . Uric acid  . Hepatic function panel   No orders of the defined types were placed in this encounter.     Procedures: No procedures performed   Clinical Data: No additional findings.   Subjective: Chief Complaint  Patient presents with  . Right Foot - Pain    The patient is an 82 year old gentleman who presents today for evaluation of right great toe pain. Patient reports the pain has been ongoing for months worsening lately. Pain feels like intermittent stabbing burning and numbness sometimes the numbness and burning extended over the top of his foot. Pointing at the MTP joint of the right great toe is the most painful area. Pain with ambulation. Pain is worse at night. Reports a history of epidural steroid injections for radiculopathy. Wonders if the pain could be related. Requesting steroid injection to the great toe.  Of note the patient does not have diabetes. Does take Neurontin 300 mg twice a day for history of seizures.    Review of Systems  Constitutional: Negative for chills and fever.  Musculoskeletal: Positive for arthralgias.  Skin: Positive for color change.     Objective: Vital Signs: There were no vitals taken for this  visit.  Physical Exam  Constitutional: He is oriented to person, place, and time. He appears well-developed and well-nourished.  Cardiovascular:  Pulses:      Dorsalis pedis pulses are 2+ on the right side.  Pulmonary/Chest: Effort normal.  Musculoskeletal:       Feet:  Does have an antalgic gait. Ambulatory with a walker today.  Feet:  Right Foot:  Protective Sensation: 10 sites tested. 1 site sensed. Neurological: He is alert and oriented to person, place, and time.  Psychiatric: He has a normal mood and affect.  Nursing note reviewed.   Ortho Exam  Specialty Comments:  No specialty comments available.  Imaging: No results found.   PMFS History: Patient Active Problem List   Diagnosis Date Noted  . Hallux rigidus, right foot 03/10/2016  . Lumbosacral spondylosis without myelopathy 10/29/2015  . Memory difficulty 09/22/2015  . Abnormality of gait 09/22/2015  . Seizures ([place]) 07/06/2015  . Hyperglycemia 07/06/2015  . Leukocytosis 07/06/2015  . Metabolic acidosis 0000000  . Chronic pain 07/06/2015  . Chronic insomnia 03/30/2015  . Transient alteration of awareness 03/30/2015  . Abnormal liver function   . Sepsis ([place]) 01/31/2015  . Altered mental status 01/31/2015  . Essential hypertension 01/31/2015  . Constipation 01/31/2015  . Hypothyroidism 01/31/2015  . Acute encephalopathy 01/31/2015  . Bladder outlet obstruction 01/31/2015  . GERD (gastroesophageal reflux disease)  01/31/2015  . Chronic back pain 01/31/2015  . Sigmoid diverticulitis 01/31/2015  . Acute kidney injury ([place]) 01/31/2015   Past Medical History:  Diagnosis Date  . Abnormality of gait 09/22/2015  . Arthritis   . Bruises easily   . Cancer ([place])    skin CA removed from back  . Chronic insomnia 03/30/2015  . Complication of anesthesia    trouble waking up  . Constipation    from medications taken  . GERD (gastroesophageal reflux disease)   . Hypercholesteremia   . Hypertension   .  Hypothyroidism   . Memory difficulty 09/22/2015  . Osteoarthritis   . Transient alteration of awareness 03/30/2015  . Vertigo    hx of    Family History  Problem Relation Age of Onset  . Hypertension Mother   . Cancer Mother   . Kidney failure Father   . Heart disease Father     Past Surgical History:  Procedure Laterality Date  . BACK SURGERY    . CARDIAC CATHETERIZATION     Stent X 1  . CERVIX SURGERY    . CORONARY ANGIOPLASTY    . EYE SURGERY     Bilateral Cataract surgery   . HERNIA REPAIR    . I&D EXTREMITY Right 05/10/2015   Procedure: IRRIGATION AND DEBRIDEMENT EXTREMITY;  Surgeon: Leanora Cover, MD;  Location: [facility];  Service: Orthopedics;  Laterality: Right;  . KNEE ARTHROPLASTY     right knee X 2; left knee once  . LAMINECTOMY     X 6  . POSTERIOR CERVICAL FUSION/FORAMINOTOMY  01/28/2012   Procedure: POSTERIOR CERVICAL FUSION/FORAMINOTOMY LEVEL 3;  Surgeon: [name], MD;  Location: [facility];  Service: Neurosurgery;  Laterality: Left;  Posterior Cervical Five-Thoracic One Fusion, Arthrodesis with LEFT Cervical Seven-thoracic One Laminectomy, Foraminotomy and Resection of Synovial Cyst  . POSTERIOR CERVICAL FUSION/FORAMINOTOMY N/A 01/29/2013   Procedure: POSTERIOR CERVICAL FUSION/FORAMINOTOMY LEVEL 1 and C2-5 Posteriolateral Arthrodesis;  Surgeon: [name], MD;  Location: [facility];  Service: Neurosurgery;  Laterality: N/A;  C2-C3 Laminectomy C2-C3 posterior cervical arthrodesis  . TONSILLECTOMY     Social History   Occupational History  . retired Software engineer    Social History Main Topics  . Smoking status: Former Research scientist (life sciences)  . Smokeless tobacco: Never Used  . Alcohol use No     Comment: rare  . Drug use: No  . Sexual activity: Not on file

## 2016-03-10 NOTE — Telephone Encounter (Signed)
Call patient discussed lab results and plan as well as radiographs. Patient agreeable with plan. We will follow-up with him in the office in 4 weeks.

## 2016-03-26 ENCOUNTER — Ambulatory Visit: Payer: Medicare Other | Admitting: Neurology

## 2016-05-15 ENCOUNTER — Encounter (HOSPITAL_BASED_OUTPATIENT_CLINIC_OR_DEPARTMENT_OTHER): Payer: Medicare Other | Attending: Surgery

## 2016-06-11 ENCOUNTER — Encounter: Payer: Self-pay | Admitting: Podiatry

## 2016-06-11 ENCOUNTER — Ambulatory Visit (INDEPENDENT_AMBULATORY_CARE_PROVIDER_SITE_OTHER): Payer: Medicare Other | Admitting: Podiatry

## 2016-06-11 DIAGNOSIS — M79609 Pain in unspecified limb: Secondary | ICD-10-CM | POA: Diagnosis not present

## 2016-06-11 DIAGNOSIS — L603 Nail dystrophy: Secondary | ICD-10-CM | POA: Diagnosis not present

## 2016-06-11 DIAGNOSIS — B351 Tinea unguium: Secondary | ICD-10-CM

## 2016-06-11 DIAGNOSIS — L97522 Non-pressure chronic ulcer of other part of left foot with fat layer exposed: Secondary | ICD-10-CM | POA: Diagnosis not present

## 2016-06-11 DIAGNOSIS — L608 Other nail disorders: Secondary | ICD-10-CM | POA: Diagnosis not present

## 2016-06-11 NOTE — Progress Notes (Signed)
   SUBJECTIVE Patient  presents to office today complaining of elongated, thickened nails. Pain while ambulating in shoes. Patient is unable to trim their own nails.  Patient also has an ulcer to the medial aspect of the left first MPJ. Patient has appointment this week with the wound care center.  OBJECTIVE General Patient is awake, alert, and oriented x 3 and in no acute distress. Derm ulcer left first MPJ measuring approximately [phone] cm. Ulcer appears healthy and granular with intact periwound integrity. Minimal drainage noted. No malodor noted. Skin is dry and supple bilateral. Negative open lesions or macerations. Remaining integument unremarkable. Nails are tender, long, thickened and dystrophic with subungual debris, consistent with onychomycosis, 1-5 bilateral. No signs of infection noted. Vasc  DP and PT pedal pulses palpable bilaterally. Temperature gradient within normal limits.  Neuro Epicritic and protective threshold sensation diminished bilaterally.  Musculoskeletal Exam No symptomatic pedal deformities noted bilateral. Muscular strength within normal limits.  ASSESSMENT 1. Onychodystrophic nails 1-5 bilateral with hyperkeratosis of nails.  2. Onychomycosis of nail due to dermatophyte bilateral 3. Ulcer first MPJ left foot  PLAN OF CARE 1. Patient evaluated today.  2. Instructed to maintain good pedal hygiene and foot care.  3. Mechanical debridement of nails 1-5 bilaterally performed using a nail nipper. Filed with dremel without incident.  4. Return to clinic in 3 mos.    Edrick Kins, DPM [facility]  Dr. Edrick Kins, [address]                                        [place], [place] [address]                Office [phone]  Fax [phone]

## 2016-06-13 ENCOUNTER — Other Ambulatory Visit: Payer: Self-pay | Admitting: Surgery

## 2016-06-13 ENCOUNTER — Encounter (HOSPITAL_BASED_OUTPATIENT_CLINIC_OR_DEPARTMENT_OTHER): Payer: Medicare Other | Attending: Surgery

## 2016-06-13 ENCOUNTER — Ambulatory Visit (HOSPITAL_COMMUNITY)
Admission: RE | Admit: 2016-06-13 | Discharge: 2016-06-13 | Disposition: A | Discharge status: Home / Self Care | Payer: Medicare Other | Source: Ambulatory Visit | Attending: Surgery | Admitting: Surgery

## 2016-06-13 DIAGNOSIS — G6182 Multifocal motor neuropathy: Secondary | ICD-10-CM | POA: Insufficient documentation

## 2016-06-13 DIAGNOSIS — I10 Essential (primary) hypertension: Secondary | ICD-10-CM | POA: Diagnosis not present

## 2016-06-13 DIAGNOSIS — I2510 Atherosclerotic heart disease of native coronary artery without angina pectoris: Secondary | ICD-10-CM | POA: Diagnosis not present

## 2016-06-13 DIAGNOSIS — L97521 Non-pressure chronic ulcer of other part of left foot limited to breakdown of skin: Secondary | ICD-10-CM | POA: Diagnosis not present

## 2016-06-13 DIAGNOSIS — Z87891 Personal history of nicotine dependence: Secondary | ICD-10-CM | POA: Diagnosis not present

## 2016-06-13 DIAGNOSIS — G4730 Sleep apnea, unspecified: Secondary | ICD-10-CM | POA: Insufficient documentation

## 2016-06-13 DIAGNOSIS — Z96659 Presence of unspecified artificial knee joint: Secondary | ICD-10-CM | POA: Diagnosis not present

## 2016-06-13 DIAGNOSIS — L97522 Non-pressure chronic ulcer of other part of left foot with fat layer exposed: Secondary | ICD-10-CM | POA: Diagnosis not present

## 2016-06-13 DIAGNOSIS — M86172 Other acute osteomyelitis, left ankle and foot: Secondary | ICD-10-CM

## 2016-06-20 DIAGNOSIS — L97521 Non-pressure chronic ulcer of other part of left foot limited to breakdown of skin: Secondary | ICD-10-CM | POA: Diagnosis not present

## 2016-06-27 DIAGNOSIS — L97521 Non-pressure chronic ulcer of other part of left foot limited to breakdown of skin: Secondary | ICD-10-CM | POA: Diagnosis not present

## 2016-07-04 DIAGNOSIS — L97521 Non-pressure chronic ulcer of other part of left foot limited to breakdown of skin: Secondary | ICD-10-CM | POA: Diagnosis not present

## 2016-07-11 ENCOUNTER — Encounter (HOSPITAL_BASED_OUTPATIENT_CLINIC_OR_DEPARTMENT_OTHER): Payer: Medicare Other | Attending: Surgery

## 2016-07-11 DIAGNOSIS — E039 Hypothyroidism, unspecified: Secondary | ICD-10-CM | POA: Insufficient documentation

## 2016-07-11 DIAGNOSIS — I10 Essential (primary) hypertension: Secondary | ICD-10-CM | POA: Insufficient documentation

## 2016-07-11 DIAGNOSIS — L97521 Non-pressure chronic ulcer of other part of left foot limited to breakdown of skin: Secondary | ICD-10-CM | POA: Insufficient documentation

## 2016-07-11 DIAGNOSIS — Z7952 Long term (current) use of systemic steroids: Secondary | ICD-10-CM | POA: Insufficient documentation

## 2016-07-11 DIAGNOSIS — G6182 Multifocal motor neuropathy: Secondary | ICD-10-CM | POA: Diagnosis not present

## 2016-07-11 DIAGNOSIS — I2510 Atherosclerotic heart disease of native coronary artery without angina pectoris: Secondary | ICD-10-CM | POA: Diagnosis not present

## 2016-07-11 DIAGNOSIS — G4730 Sleep apnea, unspecified: Secondary | ICD-10-CM | POA: Diagnosis not present

## 2016-07-18 DIAGNOSIS — L97521 Non-pressure chronic ulcer of other part of left foot limited to breakdown of skin: Secondary | ICD-10-CM | POA: Diagnosis not present

## 2016-07-24 DIAGNOSIS — L97521 Non-pressure chronic ulcer of other part of left foot limited to breakdown of skin: Secondary | ICD-10-CM | POA: Diagnosis not present

## 2016-07-31 DIAGNOSIS — L97521 Non-pressure chronic ulcer of other part of left foot limited to breakdown of skin: Secondary | ICD-10-CM | POA: Diagnosis not present

## 2016-08-07 DIAGNOSIS — L97521 Non-pressure chronic ulcer of other part of left foot limited to breakdown of skin: Secondary | ICD-10-CM | POA: Diagnosis not present

## 2016-08-14 ENCOUNTER — Encounter (HOSPITAL_BASED_OUTPATIENT_CLINIC_OR_DEPARTMENT_OTHER): Payer: Medicare Other | Attending: Surgery

## 2016-08-14 DIAGNOSIS — G894 Chronic pain syndrome: Secondary | ICD-10-CM | POA: Diagnosis not present

## 2016-08-14 DIAGNOSIS — G4730 Sleep apnea, unspecified: Secondary | ICD-10-CM | POA: Insufficient documentation

## 2016-08-14 DIAGNOSIS — S41112A Laceration without foreign body of left upper arm, initial encounter: Secondary | ICD-10-CM | POA: Insufficient documentation

## 2016-08-14 DIAGNOSIS — I2510 Atherosclerotic heart disease of native coronary artery without angina pectoris: Secondary | ICD-10-CM | POA: Insufficient documentation

## 2016-08-14 DIAGNOSIS — Z7952 Long term (current) use of systemic steroids: Secondary | ICD-10-CM | POA: Diagnosis not present

## 2016-08-14 DIAGNOSIS — G6182 Multifocal motor neuropathy: Secondary | ICD-10-CM | POA: Insufficient documentation

## 2016-08-14 DIAGNOSIS — L97521 Non-pressure chronic ulcer of other part of left foot limited to breakdown of skin: Secondary | ICD-10-CM | POA: Diagnosis present

## 2016-08-14 DIAGNOSIS — I10 Essential (primary) hypertension: Secondary | ICD-10-CM | POA: Diagnosis not present

## 2016-08-14 DIAGNOSIS — W19XXXA Unspecified fall, initial encounter: Secondary | ICD-10-CM | POA: Insufficient documentation

## 2016-08-14 DIAGNOSIS — E039 Hypothyroidism, unspecified: Secondary | ICD-10-CM | POA: Diagnosis not present

## 2016-08-21 DIAGNOSIS — L97521 Non-pressure chronic ulcer of other part of left foot limited to breakdown of skin: Secondary | ICD-10-CM | POA: Diagnosis not present

## 2016-08-28 DIAGNOSIS — L97521 Non-pressure chronic ulcer of other part of left foot limited to breakdown of skin: Secondary | ICD-10-CM | POA: Diagnosis not present

## 2016-08-31 ENCOUNTER — Telehealth (INDEPENDENT_AMBULATORY_CARE_PROVIDER_SITE_OTHER): Payer: Self-pay

## 2016-08-31 NOTE — Telephone Encounter (Signed)
Patient would like a return call about TENS unit. [phone] Stated that they had spoke with physical therapist and they suggested a different type of unit that they would like to discuss with Dr. Ernestina Patches.

## 2016-09-03 ENCOUNTER — Telehealth (INDEPENDENT_AMBULATORY_CARE_PROVIDER_SITE_OTHER): Payer: Self-pay | Admitting: Physical Medicine and Rehabilitation

## 2016-09-03 NOTE — Telephone Encounter (Signed)
error 

## 2016-09-04 NOTE — Telephone Encounter (Signed)
Called and discussed with patient. New rx for PT was written for patient on 5/3 to evaluate and treat, possible TENS unit. Rx did not specify type. Patient is not sure if they picked up rx or which physical therapist they should see.

## 2016-09-05 DIAGNOSIS — L97521 Non-pressure chronic ulcer of other part of left foot limited to breakdown of skin: Secondary | ICD-10-CM | POA: Diagnosis not present

## 2016-09-25 ENCOUNTER — Encounter (HOSPITAL_BASED_OUTPATIENT_CLINIC_OR_DEPARTMENT_OTHER): Payer: Medicare Other | Attending: Surgery

## 2016-09-25 DIAGNOSIS — I2510 Atherosclerotic heart disease of native coronary artery without angina pectoris: Secondary | ICD-10-CM | POA: Insufficient documentation

## 2016-09-25 DIAGNOSIS — I10 Essential (primary) hypertension: Secondary | ICD-10-CM | POA: Diagnosis not present

## 2016-09-25 DIAGNOSIS — L97521 Non-pressure chronic ulcer of other part of left foot limited to breakdown of skin: Secondary | ICD-10-CM | POA: Diagnosis present

## 2016-09-25 DIAGNOSIS — G4730 Sleep apnea, unspecified: Secondary | ICD-10-CM | POA: Diagnosis not present

## 2016-10-02 DIAGNOSIS — L97521 Non-pressure chronic ulcer of other part of left foot limited to breakdown of skin: Secondary | ICD-10-CM | POA: Diagnosis not present

## 2016-10-09 DIAGNOSIS — L97521 Non-pressure chronic ulcer of other part of left foot limited to breakdown of skin: Secondary | ICD-10-CM | POA: Diagnosis not present

## 2016-10-16 ENCOUNTER — Encounter (HOSPITAL_BASED_OUTPATIENT_CLINIC_OR_DEPARTMENT_OTHER): Payer: Medicare Other | Attending: Surgery

## 2016-10-16 DIAGNOSIS — I2510 Atherosclerotic heart disease of native coronary artery without angina pectoris: Secondary | ICD-10-CM | POA: Insufficient documentation

## 2016-10-16 DIAGNOSIS — I890 Lymphedema, not elsewhere classified: Secondary | ICD-10-CM | POA: Diagnosis not present

## 2016-10-16 DIAGNOSIS — G4730 Sleep apnea, unspecified: Secondary | ICD-10-CM | POA: Insufficient documentation

## 2016-10-16 DIAGNOSIS — I10 Essential (primary) hypertension: Secondary | ICD-10-CM | POA: Diagnosis not present

## 2016-10-16 DIAGNOSIS — L97522 Non-pressure chronic ulcer of other part of left foot with fat layer exposed: Secondary | ICD-10-CM | POA: Diagnosis not present

## 2016-10-16 DIAGNOSIS — L97811 Non-pressure chronic ulcer of other part of right lower leg limited to breakdown of skin: Secondary | ICD-10-CM | POA: Insufficient documentation

## 2016-10-24 DIAGNOSIS — L97522 Non-pressure chronic ulcer of other part of left foot with fat layer exposed: Secondary | ICD-10-CM | POA: Diagnosis not present

## 2016-10-29 ENCOUNTER — Ambulatory Visit (INDEPENDENT_AMBULATORY_CARE_PROVIDER_SITE_OTHER): Payer: Medicare Other | Admitting: Podiatry

## 2016-10-29 ENCOUNTER — Encounter: Payer: Self-pay | Admitting: Podiatry

## 2016-10-29 DIAGNOSIS — B351 Tinea unguium: Secondary | ICD-10-CM | POA: Diagnosis not present

## 2016-10-29 DIAGNOSIS — M79676 Pain in unspecified toe(s): Secondary | ICD-10-CM

## 2016-10-29 DIAGNOSIS — I739 Peripheral vascular disease, unspecified: Secondary | ICD-10-CM

## 2016-10-29 NOTE — Progress Notes (Signed)
   SUBJECTIVE Patient  presents to office today complaining of elongated, thickened nails. Pain while ambulating in shoes. Patient is unable to trim their own nails. Patient is currently being seen by Dr. Britto at the wound care center for an ulcer to the left forefoot. Today dressings are intact.  OBJECTIVE General Patient is awake, alert, and oriented x 3 and in no acute distress. Derm ulcer left first MPJ measuring approximately 1.01.00.2 cm. Ulcer appears healthy and granular with intact periwound integrity. Minimal drainage noted. No malodor noted. Skin is dry and supple bilateral. Negative open lesions or macerations. Remaining integument unremarkable. Nails are tender, long, thickened and dystrophic with subungual debris, consistent with onychomycosis, 1-5 bilateral. No signs of infection noted. Vasc  DP and PT pedal pulses diminished bilaterally. Right lower extremity is cool compared to the contralateral limb. Delayed capillary refill noted.  Neuro Epicritic and protective threshold sensation diminished bilaterally.  Musculoskeletal Exam No symptomatic pedal deformities noted bilateral. Muscular strength within normal limits.  ASSESSMENT 1. Peripheral vascular disease 2. Onychomycosis of nail due to dermatophyte bilateral 3. Ulcer first MPJ left foot  PLAN OF CARE 1. Patient evaluated today.  2. Instructed to maintain good pedal hygiene and foot care.  3. Mechanical debridement of nails 1-5 bilaterally performed using a nail nipper. Filed with dremel without incident.  4. Today we're going to place a referral for vascular consultation due to clinical findings of peripheral vascular disease 5. Continue wound care with Dr. Britto 6. Return to clinic in 3 months for routine nail care     Brent M. Evans, DPM [facility]  Dr. Brent M. Evans, DPM    [address]                                        [place], [place] [address]                Office [phone]  Fax [phone]         

## 2016-10-30 ENCOUNTER — Telehealth: Payer: Self-pay | Admitting: *Deleted

## 2016-10-30 DIAGNOSIS — I739 Peripheral vascular disease, unspecified: Secondary | ICD-10-CM

## 2016-10-30 NOTE — Telephone Encounter (Addendum)
-----   Message from Edrick Kins, DPM sent at 10/29/2016 11:48 AM EDT ----- Regarding:  vascular consult Please place vascular consult for patient. Diagnosis: PVD right lower extremity  Note dictated. Thanks, Dr. Amalia Hailey.10/30/2016-Faxed referral, clinicals and demographics [facility].

## 2016-10-31 DIAGNOSIS — L97522 Non-pressure chronic ulcer of other part of left foot with fat layer exposed: Secondary | ICD-10-CM | POA: Diagnosis not present

## 2016-11-07 DIAGNOSIS — L97522 Non-pressure chronic ulcer of other part of left foot with fat layer exposed: Secondary | ICD-10-CM | POA: Diagnosis not present

## 2016-11-21 ENCOUNTER — Encounter (HOSPITAL_BASED_OUTPATIENT_CLINIC_OR_DEPARTMENT_OTHER): Payer: Medicare Other

## 2017-01-09 ENCOUNTER — Encounter (HOSPITAL_BASED_OUTPATIENT_CLINIC_OR_DEPARTMENT_OTHER): Payer: Medicare Other | Attending: Surgery

## 2017-01-09 DIAGNOSIS — I2510 Atherosclerotic heart disease of native coronary artery without angina pectoris: Secondary | ICD-10-CM | POA: Insufficient documentation

## 2017-01-09 DIAGNOSIS — L97812 Non-pressure chronic ulcer of other part of right lower leg with fat layer exposed: Secondary | ICD-10-CM | POA: Insufficient documentation

## 2017-01-09 DIAGNOSIS — I739 Peripheral vascular disease, unspecified: Secondary | ICD-10-CM | POA: Insufficient documentation

## 2017-01-09 DIAGNOSIS — Z87891 Personal history of nicotine dependence: Secondary | ICD-10-CM | POA: Insufficient documentation

## 2017-01-09 DIAGNOSIS — L97529 Non-pressure chronic ulcer of other part of left foot with unspecified severity: Secondary | ICD-10-CM | POA: Insufficient documentation

## 2017-01-09 DIAGNOSIS — L97522 Non-pressure chronic ulcer of other part of left foot with fat layer exposed: Secondary | ICD-10-CM | POA: Insufficient documentation

## 2017-01-09 DIAGNOSIS — I10 Essential (primary) hypertension: Secondary | ICD-10-CM | POA: Insufficient documentation

## 2017-01-09 DIAGNOSIS — L98499 Non-pressure chronic ulcer of skin of other sites with unspecified severity: Secondary | ICD-10-CM | POA: Diagnosis not present

## 2017-01-09 DIAGNOSIS — G4730 Sleep apnea, unspecified: Secondary | ICD-10-CM | POA: Diagnosis not present

## 2017-01-09 DIAGNOSIS — L97519 Non-pressure chronic ulcer of other part of right foot with unspecified severity: Secondary | ICD-10-CM | POA: Insufficient documentation

## 2017-01-17 ENCOUNTER — Other Ambulatory Visit: Payer: Self-pay | Admitting: Surgery

## 2017-01-17 ENCOUNTER — Ambulatory Visit (HOSPITAL_COMMUNITY): Admission: RE | Admit: 2017-01-17 | Payer: Medicare Other | Source: Ambulatory Visit

## 2017-01-17 ENCOUNTER — Ambulatory Visit (HOSPITAL_COMMUNITY)
Admission: RE | Admit: 2017-01-17 | Discharge: 2017-01-17 | Disposition: A | Discharge status: Home / Self Care | Payer: Medicare Other | Source: Ambulatory Visit | Attending: Vascular Surgery | Admitting: Vascular Surgery

## 2017-01-17 DIAGNOSIS — L97529 Non-pressure chronic ulcer of other part of left foot with unspecified severity: Secondary | ICD-10-CM

## 2017-01-22 ENCOUNTER — Encounter (HOSPITAL_BASED_OUTPATIENT_CLINIC_OR_DEPARTMENT_OTHER): Payer: Medicare Other | Attending: Surgery

## 2017-01-22 DIAGNOSIS — L97812 Non-pressure chronic ulcer of other part of right lower leg with fat layer exposed: Secondary | ICD-10-CM | POA: Diagnosis not present

## 2017-01-22 DIAGNOSIS — L97529 Non-pressure chronic ulcer of other part of left foot with unspecified severity: Secondary | ICD-10-CM | POA: Insufficient documentation

## 2017-01-22 DIAGNOSIS — L98499 Non-pressure chronic ulcer of skin of other sites with unspecified severity: Secondary | ICD-10-CM | POA: Insufficient documentation

## 2017-01-22 DIAGNOSIS — L97519 Non-pressure chronic ulcer of other part of right foot with unspecified severity: Secondary | ICD-10-CM | POA: Insufficient documentation

## 2017-01-22 DIAGNOSIS — G4730 Sleep apnea, unspecified: Secondary | ICD-10-CM | POA: Insufficient documentation

## 2017-01-22 DIAGNOSIS — I10 Essential (primary) hypertension: Secondary | ICD-10-CM | POA: Diagnosis not present

## 2017-01-22 DIAGNOSIS — I2510 Atherosclerotic heart disease of native coronary artery without angina pectoris: Secondary | ICD-10-CM | POA: Diagnosis not present

## 2017-01-28 ENCOUNTER — Ambulatory Visit (INDEPENDENT_AMBULATORY_CARE_PROVIDER_SITE_OTHER): Payer: Medicare Other | Admitting: Podiatry

## 2017-01-28 ENCOUNTER — Encounter: Payer: Self-pay | Admitting: Podiatry

## 2017-01-28 DIAGNOSIS — M79676 Pain in unspecified toe(s): Secondary | ICD-10-CM | POA: Diagnosis not present

## 2017-01-28 DIAGNOSIS — B351 Tinea unguium: Secondary | ICD-10-CM

## 2017-01-28 DIAGNOSIS — I739 Peripheral vascular disease, unspecified: Secondary | ICD-10-CM

## 2017-01-28 NOTE — Progress Notes (Signed)
   SUBJECTIVE Patient  presents to office today complaining of elongated, thickened nails. Pain while ambulating in shoes. Patient is unable to trim their own nails. Patient is currently being seen by Dr. Con Memos at the wound care center for an ulcer to the left forefoot. Today dressings are intact.  OBJECTIVE General Patient is awake, alert, and oriented x 3 and in no acute distress. Derm ulcer left first MPJ measuring approximately [phone] cm. Ulcer appears healthy and granular with intact periwound integrity. Minimal drainage noted. No malodor noted. Skin is dry and supple bilateral. Negative open lesions or macerations. Remaining integument unremarkable. Nails are tender, long, thickened and dystrophic with subungual debris, consistent with onychomycosis, 1-5 bilateral. No signs of infection noted. Vasc  DP and PT pedal pulses diminished bilaterally. Right lower extremity is cool compared to the contralateral limb. Delayed capillary refill noted.  Neuro Epicritic and protective threshold sensation diminished bilaterally.  Musculoskeletal Exam No symptomatic pedal deformities noted bilateral. Muscular strength within normal limits.  ASSESSMENT 1. Peripheral vascular disease 2. Onychomycosis of nail due to dermatophyte bilateral 3. Ulcer first MPJ left foot  PLAN OF CARE 1. Patient evaluated today.  2. Instructed to maintain good pedal hygiene and foot care.  3. Mechanical debridement of nails 1-5 bilaterally performed using a nail nipper. Filed with dremel without incident.  4. Today we're going to place a referral for vascular consultation due to clinical findings of peripheral vascular disease 5. Continue wound care with Dr. Con Memos 6. Return to clinic in 3 months for routine nail care     Edrick Kins, DPM [facility]  Dr. Edrick Kins, [address]                                        [place], [place] [address]                Office [phone]  Fax [phone]

## 2017-02-05 DIAGNOSIS — L97519 Non-pressure chronic ulcer of other part of right foot with unspecified severity: Secondary | ICD-10-CM | POA: Diagnosis not present

## 2017-02-13 ENCOUNTER — Encounter (HOSPITAL_BASED_OUTPATIENT_CLINIC_OR_DEPARTMENT_OTHER): Payer: Medicare Other | Attending: Surgery

## 2017-02-13 DIAGNOSIS — L97519 Non-pressure chronic ulcer of other part of right foot with unspecified severity: Secondary | ICD-10-CM | POA: Insufficient documentation

## 2017-02-13 DIAGNOSIS — L98499 Non-pressure chronic ulcer of skin of other sites with unspecified severity: Secondary | ICD-10-CM | POA: Diagnosis present

## 2017-02-13 DIAGNOSIS — L97529 Non-pressure chronic ulcer of other part of left foot with unspecified severity: Secondary | ICD-10-CM | POA: Insufficient documentation

## 2017-02-13 DIAGNOSIS — I739 Peripheral vascular disease, unspecified: Secondary | ICD-10-CM | POA: Diagnosis not present

## 2017-02-13 DIAGNOSIS — I2510 Atherosclerotic heart disease of native coronary artery without angina pectoris: Secondary | ICD-10-CM | POA: Diagnosis not present

## 2017-02-13 DIAGNOSIS — G4730 Sleep apnea, unspecified: Secondary | ICD-10-CM | POA: Insufficient documentation

## 2017-02-13 DIAGNOSIS — L97812 Non-pressure chronic ulcer of other part of right lower leg with fat layer exposed: Secondary | ICD-10-CM | POA: Insufficient documentation

## 2017-02-13 DIAGNOSIS — I10 Essential (primary) hypertension: Secondary | ICD-10-CM | POA: Insufficient documentation

## 2017-02-27 DIAGNOSIS — L98499 Non-pressure chronic ulcer of skin of other sites with unspecified severity: Secondary | ICD-10-CM | POA: Diagnosis not present

## 2017-03-13 ENCOUNTER — Encounter (HOSPITAL_BASED_OUTPATIENT_CLINIC_OR_DEPARTMENT_OTHER): Payer: Medicare Other | Attending: Physician Assistant

## 2017-03-13 DIAGNOSIS — L89890 Pressure ulcer of other site, unstageable: Secondary | ICD-10-CM | POA: Diagnosis present

## 2017-03-13 DIAGNOSIS — L97519 Non-pressure chronic ulcer of other part of right foot with unspecified severity: Secondary | ICD-10-CM | POA: Diagnosis not present

## 2017-03-13 DIAGNOSIS — G4730 Sleep apnea, unspecified: Secondary | ICD-10-CM | POA: Insufficient documentation

## 2017-03-13 DIAGNOSIS — I2510 Atherosclerotic heart disease of native coronary artery without angina pectoris: Secondary | ICD-10-CM | POA: Diagnosis not present

## 2017-03-13 DIAGNOSIS — L97312 Non-pressure chronic ulcer of right ankle with fat layer exposed: Secondary | ICD-10-CM | POA: Insufficient documentation

## 2017-03-13 DIAGNOSIS — L950 Livedoid vasculitis: Secondary | ICD-10-CM | POA: Insufficient documentation

## 2017-03-13 DIAGNOSIS — I10 Essential (primary) hypertension: Secondary | ICD-10-CM | POA: Insufficient documentation

## 2017-03-13 DIAGNOSIS — L97512 Non-pressure chronic ulcer of other part of right foot with fat layer exposed: Secondary | ICD-10-CM | POA: Diagnosis not present

## 2017-03-13 DIAGNOSIS — Z7952 Long term (current) use of systemic steroids: Secondary | ICD-10-CM | POA: Diagnosis not present

## 2017-03-13 DIAGNOSIS — L97529 Non-pressure chronic ulcer of other part of left foot with unspecified severity: Secondary | ICD-10-CM | POA: Diagnosis not present

## 2017-03-13 DIAGNOSIS — M1990 Unspecified osteoarthritis, unspecified site: Secondary | ICD-10-CM | POA: Insufficient documentation

## 2017-03-13 DIAGNOSIS — L97812 Non-pressure chronic ulcer of other part of right lower leg with fat layer exposed: Secondary | ICD-10-CM | POA: Insufficient documentation

## 2017-03-19 ENCOUNTER — Encounter (HOSPITAL_BASED_OUTPATIENT_CLINIC_OR_DEPARTMENT_OTHER): Payer: Medicare Other

## 2017-03-19 DIAGNOSIS — L89890 Pressure ulcer of other site, unstageable: Secondary | ICD-10-CM | POA: Diagnosis not present

## 2017-03-26 ENCOUNTER — Other Ambulatory Visit: Payer: Self-pay | Admitting: Internal Medicine

## 2017-03-26 DIAGNOSIS — L89890 Pressure ulcer of other site, unstageable: Secondary | ICD-10-CM | POA: Diagnosis not present

## 2017-04-02 DIAGNOSIS — L89890 Pressure ulcer of other site, unstageable: Secondary | ICD-10-CM | POA: Diagnosis not present

## 2017-04-09 ENCOUNTER — Other Ambulatory Visit: Payer: Self-pay | Admitting: Internal Medicine

## 2017-04-09 ENCOUNTER — Ambulatory Visit (HOSPITAL_COMMUNITY)
Admission: RE | Admit: 2017-04-09 | Discharge: 2017-04-09 | Disposition: A | Discharge status: Home / Self Care | Payer: Medicare Other | Source: Ambulatory Visit | Attending: Internal Medicine | Admitting: Internal Medicine

## 2017-04-09 DIAGNOSIS — L97512 Non-pressure chronic ulcer of other part of right foot with fat layer exposed: Secondary | ICD-10-CM | POA: Diagnosis not present

## 2017-04-09 DIAGNOSIS — L97316 Non-pressure chronic ulcer of right ankle with bone involvement without evidence of necrosis: Secondary | ICD-10-CM | POA: Insufficient documentation

## 2017-04-09 DIAGNOSIS — M19071 Primary osteoarthritis, right ankle and foot: Secondary | ICD-10-CM | POA: Diagnosis not present

## 2017-04-09 DIAGNOSIS — L89890 Pressure ulcer of other site, unstageable: Secondary | ICD-10-CM | POA: Diagnosis not present

## 2017-04-16 ENCOUNTER — Encounter (HOSPITAL_BASED_OUTPATIENT_CLINIC_OR_DEPARTMENT_OTHER): Payer: Medicare Other | Attending: Internal Medicine

## 2017-04-16 DIAGNOSIS — X58XXXA Exposure to other specified factors, initial encounter: Secondary | ICD-10-CM | POA: Insufficient documentation

## 2017-04-16 DIAGNOSIS — L97812 Non-pressure chronic ulcer of other part of right lower leg with fat layer exposed: Secondary | ICD-10-CM | POA: Insufficient documentation

## 2017-04-16 DIAGNOSIS — I739 Peripheral vascular disease, unspecified: Secondary | ICD-10-CM | POA: Insufficient documentation

## 2017-04-16 DIAGNOSIS — R410 Disorientation, unspecified: Secondary | ICD-10-CM | POA: Insufficient documentation

## 2017-04-16 DIAGNOSIS — L97512 Non-pressure chronic ulcer of other part of right foot with fat layer exposed: Secondary | ICD-10-CM | POA: Insufficient documentation

## 2017-04-16 DIAGNOSIS — L97312 Non-pressure chronic ulcer of right ankle with fat layer exposed: Secondary | ICD-10-CM | POA: Insufficient documentation

## 2017-04-16 DIAGNOSIS — I10 Essential (primary) hypertension: Secondary | ICD-10-CM | POA: Insufficient documentation

## 2017-04-16 DIAGNOSIS — I2510 Atherosclerotic heart disease of native coronary artery without angina pectoris: Secondary | ICD-10-CM | POA: Insufficient documentation

## 2017-04-16 DIAGNOSIS — L950 Livedoid vasculitis: Secondary | ICD-10-CM | POA: Insufficient documentation

## 2017-04-16 DIAGNOSIS — G4730 Sleep apnea, unspecified: Secondary | ICD-10-CM | POA: Insufficient documentation

## 2017-04-16 DIAGNOSIS — L89892 Pressure ulcer of other site, stage 2: Secondary | ICD-10-CM | POA: Insufficient documentation

## 2017-04-16 DIAGNOSIS — I890 Lymphedema, not elsewhere classified: Secondary | ICD-10-CM | POA: Insufficient documentation

## 2017-04-16 DIAGNOSIS — S41112A Laceration without foreign body of left upper arm, initial encounter: Secondary | ICD-10-CM | POA: Insufficient documentation

## 2017-04-16 DIAGNOSIS — Z7952 Long term (current) use of systemic steroids: Secondary | ICD-10-CM | POA: Insufficient documentation

## 2017-04-17 ENCOUNTER — Emergency Department (HOSPITAL_COMMUNITY): Payer: Medicare Other

## 2017-04-17 ENCOUNTER — Encounter (HOSPITAL_COMMUNITY): Payer: Self-pay | Admitting: Internal Medicine

## 2017-04-17 ENCOUNTER — Inpatient Hospital Stay (HOSPITAL_COMMUNITY)
Admission: EM | Admit: 2017-04-17 | Discharge: 2017-04-25 | DRG: 871 | Disposition: A | Discharge status: Skilled Nursing Facility | Payer: Medicare Other | Attending: Internal Medicine | Admitting: Internal Medicine

## 2017-04-17 ENCOUNTER — Other Ambulatory Visit: Payer: Self-pay

## 2017-04-17 DIAGNOSIS — G8929 Other chronic pain: Secondary | ICD-10-CM | POA: Diagnosis present

## 2017-04-17 DIAGNOSIS — L97318 Non-pressure chronic ulcer of right ankle with other specified severity: Secondary | ICD-10-CM | POA: Diagnosis present

## 2017-04-17 DIAGNOSIS — I2510 Atherosclerotic heart disease of native coronary artery without angina pectoris: Secondary | ICD-10-CM | POA: Diagnosis present

## 2017-04-17 DIAGNOSIS — N179 Acute kidney failure, unspecified: Secondary | ICD-10-CM | POA: Diagnosis present

## 2017-04-17 DIAGNOSIS — Z96653 Presence of artificial knee joint, bilateral: Secondary | ICD-10-CM | POA: Diagnosis present

## 2017-04-17 DIAGNOSIS — I959 Hypotension, unspecified: Secondary | ICD-10-CM | POA: Diagnosis present

## 2017-04-17 DIAGNOSIS — Z7952 Long term (current) use of systemic steroids: Secondary | ICD-10-CM

## 2017-04-17 DIAGNOSIS — R569 Unspecified convulsions: Secondary | ICD-10-CM

## 2017-04-17 DIAGNOSIS — I4891 Unspecified atrial fibrillation: Secondary | ICD-10-CM | POA: Diagnosis not present

## 2017-04-17 DIAGNOSIS — J9601 Acute respiratory failure with hypoxia: Secondary | ICD-10-CM | POA: Diagnosis present

## 2017-04-17 DIAGNOSIS — G40909 Epilepsy, unspecified, not intractable, without status epilepticus: Secondary | ICD-10-CM | POA: Diagnosis present

## 2017-04-17 DIAGNOSIS — G4733 Obstructive sleep apnea (adult) (pediatric): Secondary | ICD-10-CM | POA: Diagnosis present

## 2017-04-17 DIAGNOSIS — Z79890 Hormone replacement therapy (postmenopausal): Secondary | ICD-10-CM

## 2017-04-17 DIAGNOSIS — I480 Paroxysmal atrial fibrillation: Secondary | ICD-10-CM | POA: Diagnosis present

## 2017-04-17 DIAGNOSIS — L97228 Non-pressure chronic ulcer of left calf with other specified severity: Secondary | ICD-10-CM | POA: Diagnosis present

## 2017-04-17 DIAGNOSIS — A419 Sepsis, unspecified organism: Principal | ICD-10-CM | POA: Diagnosis present

## 2017-04-17 DIAGNOSIS — N1831 Chronic kidney disease, stage 3a: Secondary | ICD-10-CM

## 2017-04-17 DIAGNOSIS — E039 Hypothyroidism, unspecified: Secondary | ICD-10-CM | POA: Diagnosis present

## 2017-04-17 DIAGNOSIS — J189 Pneumonia, unspecified organism: Secondary | ICD-10-CM | POA: Diagnosis not present

## 2017-04-17 DIAGNOSIS — R0602 Shortness of breath: Secondary | ICD-10-CM

## 2017-04-17 DIAGNOSIS — N183 Chronic kidney disease, stage 3 unspecified: Secondary | ICD-10-CM

## 2017-04-17 DIAGNOSIS — I130 Hypertensive heart and chronic kidney disease with heart failure and stage 1 through stage 4 chronic kidney disease, or unspecified chronic kidney disease: Secondary | ICD-10-CM | POA: Diagnosis present

## 2017-04-17 DIAGNOSIS — L97218 Non-pressure chronic ulcer of right calf with other specified severity: Secondary | ICD-10-CM | POA: Diagnosis present

## 2017-04-17 DIAGNOSIS — J181 Lobar pneumonia, unspecified organism: Secondary | ICD-10-CM | POA: Diagnosis not present

## 2017-04-17 DIAGNOSIS — K219 Gastro-esophageal reflux disease without esophagitis: Secondary | ICD-10-CM | POA: Diagnosis present

## 2017-04-17 DIAGNOSIS — R413 Other amnesia: Secondary | ICD-10-CM | POA: Diagnosis present

## 2017-04-17 DIAGNOSIS — E876 Hypokalemia: Secondary | ICD-10-CM | POA: Diagnosis present

## 2017-04-17 DIAGNOSIS — R6520 Severe sepsis without septic shock: Secondary | ICD-10-CM | POA: Diagnosis present

## 2017-04-17 DIAGNOSIS — Z955 Presence of coronary angioplasty implant and graft: Secondary | ICD-10-CM

## 2017-04-17 DIAGNOSIS — I5033 Acute on chronic diastolic (congestive) heart failure: Secondary | ICD-10-CM | POA: Diagnosis present

## 2017-04-17 DIAGNOSIS — Z8249 Family history of ischemic heart disease and other diseases of the circulatory system: Secondary | ICD-10-CM

## 2017-04-17 DIAGNOSIS — I10 Essential (primary) hypertension: Secondary | ICD-10-CM | POA: Diagnosis not present

## 2017-04-17 DIAGNOSIS — G9341 Metabolic encephalopathy: Secondary | ICD-10-CM | POA: Diagnosis present

## 2017-04-17 DIAGNOSIS — E785 Hyperlipidemia, unspecified: Secondary | ICD-10-CM | POA: Diagnosis present

## 2017-04-17 DIAGNOSIS — Z7982 Long term (current) use of aspirin: Secondary | ICD-10-CM

## 2017-04-17 DIAGNOSIS — Z87891 Personal history of nicotine dependence: Secondary | ICD-10-CM

## 2017-04-17 DIAGNOSIS — D509 Iron deficiency anemia, unspecified: Secondary | ICD-10-CM | POA: Diagnosis present

## 2017-04-17 DIAGNOSIS — J90 Pleural effusion, not elsewhere classified: Secondary | ICD-10-CM

## 2017-04-17 DIAGNOSIS — Z841 Family history of disorders of kidney and ureter: Secondary | ICD-10-CM

## 2017-04-17 DIAGNOSIS — M130 Polyarthritis, unspecified: Secondary | ICD-10-CM | POA: Diagnosis present

## 2017-04-17 DIAGNOSIS — Z981 Arthrodesis status: Secondary | ICD-10-CM

## 2017-04-17 DIAGNOSIS — L0390 Cellulitis, unspecified: Secondary | ICD-10-CM

## 2017-04-17 DIAGNOSIS — Z7983 Long term (current) use of bisphosphonates: Secondary | ICD-10-CM

## 2017-04-17 DIAGNOSIS — G9340 Encephalopathy, unspecified: Secondary | ICD-10-CM | POA: Diagnosis not present

## 2017-04-17 DIAGNOSIS — R0600 Dyspnea, unspecified: Secondary | ICD-10-CM

## 2017-04-17 LAB — COMPREHENSIVE METABOLIC PANEL
ALT: 75 U/L — ABNORMAL HIGH (ref 17–63)
AST: 103 U/L — ABNORMAL HIGH (ref 15–41)
Albumin: 3.4 g/dL — ABNORMAL LOW (ref 3.5–5.0)
Alkaline Phosphatase: 75 U/L (ref 38–126)
Anion gap: 14 (ref 5–15)
BUN: 28 mg/dL — ABNORMAL HIGH (ref 6–20)
CO2: 24 mmol/L (ref 22–32)
Calcium: 8.5 mg/dL — ABNORMAL LOW (ref 8.9–10.3)
Chloride: 108 mmol/L (ref 101–111)
Creatinine, Ser: 1.5 mg/dL — ABNORMAL HIGH (ref 0.61–1.24)
GFR calc Af Amer: 49 mL/min — ABNORMAL LOW (ref >60)
GFR calc non Af Amer: 42 mL/min — ABNORMAL LOW (ref >60)
Glucose, Bld: 111 mg/dL — ABNORMAL HIGH (ref 65–99)
Potassium: 3.7 mmol/L (ref 3.5–5.1)
Sodium: 146 mmol/L — ABNORMAL HIGH (ref 135–145)
Total Bilirubin: 1.2 mg/dL (ref 0.3–1.2)
Total Protein: 5.4 g/dL — ABNORMAL LOW (ref 6.5–8.1)

## 2017-04-17 LAB — CBC WITH DIFFERENTIAL/PLATELET
Basophils Absolute: 0 10*3/uL (ref 0.0–0.1)
Basophils Relative: 0 %
Eosinophils Absolute: 0 10*3/uL (ref 0.0–0.7)
Eosinophils Relative: 0 %
HCT: 38.5 % — ABNORMAL LOW (ref 39.0–52.0)
Hemoglobin: 11.8 g/dL — ABNORMAL LOW (ref 13.0–17.0)
Lymphocytes Relative: 6 %
Lymphs Abs: 1.1 10*3/uL (ref 0.7–4.0)
MCH: 31.1 pg (ref 26.0–34.0)
MCHC: 30.6 g/dL (ref 30.0–36.0)
MCV: 101.6 fL — ABNORMAL HIGH (ref 78.0–100.0)
Monocytes Absolute: 1.3 10*3/uL — ABNORMAL HIGH (ref 0.1–1.0)
Monocytes Relative: 7 %
Neutro Abs: 16.6 10*3/uL — ABNORMAL HIGH (ref 1.7–7.7)
Neutrophils Relative %: 87 %
Platelets: 142 10*3/uL — ABNORMAL LOW (ref 150–400)
RBC: 3.79 MIL/uL — ABNORMAL LOW (ref 4.22–5.81)
RDW: 14.9 % (ref 11.5–15.5)
WBC: 19 10*3/uL — ABNORMAL HIGH (ref 4.0–10.5)

## 2017-04-17 LAB — URINALYSIS, ROUTINE W REFLEX MICROSCOPIC
Bilirubin Urine: NEGATIVE
Glucose, UA: NEGATIVE mg/dL
Hgb urine dipstick: NEGATIVE
Ketones, ur: 5 mg/dL — AB
Leukocytes, UA: NEGATIVE
Nitrite: NEGATIVE
Protein, ur: NEGATIVE mg/dL
Specific Gravity, Urine: 1.021 (ref 1.005–1.030)
pH: 5 (ref 5.0–8.0)

## 2017-04-17 LAB — INFLUENZA PANEL BY PCR (TYPE A & B)
Influenza A By PCR: NEGATIVE
Influenza B By PCR: NEGATIVE

## 2017-04-17 LAB — AMMONIA: Ammonia: 37 umol/L — ABNORMAL HIGH (ref 9–35)

## 2017-04-17 LAB — CBG MONITORING, ED: Glucose-Capillary: 91 mg/dL (ref 65–99)

## 2017-04-17 LAB — I-STAT CG4 LACTIC ACID, ED: Lactic Acid, Venous: 2.95 mmol/L (ref 0.5–1.9)

## 2017-04-17 LAB — VALPROIC ACID LEVEL: Valproic Acid Lvl: 37 ug/mL — ABNORMAL LOW (ref 50.0–100.0)

## 2017-04-17 LAB — TSH: TSH: 1.782 u[IU]/mL (ref 0.350–4.500)

## 2017-04-17 MED ORDER — SODIUM CHLORIDE 0.9 % IV BOLUS (SEPSIS)
1000.0000 mL | Freq: Once | INTRAVENOUS | Status: AC
  Administered 2017-04-17: 1000 mL via INTRAVENOUS

## 2017-04-17 MED ORDER — DIVALPROEX SODIUM 250 MG PO DR TAB
250.0000 mg | DELAYED_RELEASE_TABLET | Freq: Every day | ORAL | Status: DC
  Administered 2017-04-18 – 2017-04-25 (×8): 250 mg via ORAL
  Filled 2017-04-17 (×8): qty 1

## 2017-04-17 MED ORDER — EZETIMIBE 10 MG PO TABS
5.0000 mg | ORAL_TABLET | Freq: Every day | ORAL | Status: DC
  Administered 2017-04-18 – 2017-04-24 (×6): 5 mg via ORAL
  Filled 2017-04-17: qty 0.5
  Filled 2017-04-17 (×6): qty 1

## 2017-04-17 MED ORDER — ONDANSETRON HCL 4 MG/2ML IJ SOLN: 4.0000 mg | Freq: Four times a day (QID) | INTRAMUSCULAR | Status: DC | PRN

## 2017-04-17 MED ORDER — LEVOTHYROXINE SODIUM 75 MCG PO TABS
150.0000 ug | ORAL_TABLET | Freq: Every day | ORAL | Status: DC
  Administered 2017-04-18 – 2017-04-25 (×8): 150 ug via ORAL
  Filled 2017-04-17 (×7): qty 2
  Filled 2017-04-17: qty 1
  Filled 2017-04-17: qty 2

## 2017-04-17 MED ORDER — DEXTROSE 5 % IV SOLN
500.0000 mg | Freq: Once | INTRAVENOUS | Status: AC
  Administered 2017-04-17: 500 mg via INTRAVENOUS
  Filled 2017-04-17: qty 500

## 2017-04-17 MED ORDER — HYDROCODONE-ACETAMINOPHEN 10-325 MG PO TABS
1.0000 | ORAL_TABLET | Freq: Once | ORAL | Status: DC
  Filled 2017-04-17: qty 1

## 2017-04-17 MED ORDER — ZINC SULFATE 220 (50 ZN) MG PO CAPS
220.0000 mg | ORAL_CAPSULE | Freq: Every morning | ORAL | Status: DC
  Administered 2017-04-18 – 2017-04-25 (×8): 220 mg via ORAL
  Filled 2017-04-17 (×8): qty 1

## 2017-04-17 MED ORDER — PRAVASTATIN SODIUM 40 MG PO TABS
40.0000 mg | ORAL_TABLET | Freq: Every day | ORAL | Status: DC
  Administered 2017-04-18 – 2017-04-24 (×6): 40 mg via ORAL
  Filled 2017-04-17 (×7): qty 1

## 2017-04-17 MED ORDER — DEXTROSE 5 % IV SOLN
500.0000 mg | INTRAVENOUS | Status: DC
  Administered 2017-04-18 – 2017-04-21 (×4): 500 mg via INTRAVENOUS
  Filled 2017-04-17 (×4): qty 500

## 2017-04-17 MED ORDER — ASPIRIN EC 81 MG PO TBEC
81.0000 mg | DELAYED_RELEASE_TABLET | Freq: Every day | ORAL | Status: DC
  Administered 2017-04-18 – 2017-04-22 (×4): 81 mg via ORAL
  Filled 2017-04-17 (×5): qty 1

## 2017-04-17 MED ORDER — GABAPENTIN 300 MG PO CAPS
300.0000 mg | ORAL_CAPSULE | Freq: Two times a day (BID) | ORAL | Status: DC
  Administered 2017-04-18 – 2017-04-25 (×14): 300 mg via ORAL
  Filled 2017-04-17 (×15): qty 1

## 2017-04-17 MED ORDER — NITROGLYCERIN 0.4 MG SL SUBL
0.4000 mg | SUBLINGUAL_TABLET | SUBLINGUAL | Status: DC | PRN
  Administered 2017-04-19 (×2): 0.4 mg via SUBLINGUAL
  Filled 2017-04-17 (×2): qty 1

## 2017-04-17 MED ORDER — DOCUSATE SODIUM 100 MG PO CAPS
200.0000 mg | ORAL_CAPSULE | Freq: Two times a day (BID) | ORAL | Status: DC
  Administered 2017-04-18 – 2017-04-25 (×14): 200 mg via ORAL
  Filled 2017-04-17 (×16): qty 2

## 2017-04-17 MED ORDER — ACETAMINOPHEN 650 MG RE SUPP: 650.0000 mg | Freq: Four times a day (QID) | RECTAL | Status: DC | PRN

## 2017-04-17 MED ORDER — DIVALPROEX SODIUM 500 MG PO DR TAB
750.0000 mg | DELAYED_RELEASE_TABLET | Freq: Every day | ORAL | Status: DC
  Administered 2017-04-18 – 2017-04-24 (×6): 750 mg via ORAL
  Filled 2017-04-17 (×8): qty 1

## 2017-04-17 MED ORDER — ACETAMINOPHEN 500 MG PO TABS
1000.0000 mg | ORAL_TABLET | Freq: Once | ORAL | Status: AC
Start: 2017-04-17 — End: 2017-04-17
  Administered 2017-04-17: 1000 mg via ORAL
  Filled 2017-04-17: qty 2

## 2017-04-17 MED ORDER — ONDANSETRON HCL 4 MG PO TABS: 4.0000 mg | ORAL_TABLET | Freq: Four times a day (QID) | ORAL | Status: DC | PRN

## 2017-04-17 MED ORDER — HYDROCORTISONE NA SUCCINATE PF 100 MG IJ SOLR
50.0000 mg | Freq: Three times a day (TID) | INTRAMUSCULAR | Status: DC
  Administered 2017-04-17 – 2017-04-21 (×11): 50 mg via INTRAVENOUS
  Filled 2017-04-17 (×10): qty 2

## 2017-04-17 MED ORDER — SODIUM CHLORIDE 0.9 % IV SOLN
INTRAVENOUS | Status: AC
  Administered 2017-04-17: via INTRAVENOUS

## 2017-04-17 MED ORDER — DEXTROSE 5 % IV SOLN
1.0000 g | INTRAVENOUS | Status: DC
  Administered 2017-04-18 – 2017-04-21 (×4): 1 g via INTRAVENOUS
  Filled 2017-04-17 (×5): qty 10

## 2017-04-17 MED ORDER — DEXTROSE 5 % IV SOLN
1.0000 g | Freq: Once | INTRAVENOUS | Status: AC
  Administered 2017-04-17: 1 g via INTRAVENOUS
  Filled 2017-04-17: qty 10

## 2017-04-17 MED ORDER — SODIUM CHLORIDE 0.9 % IV SOLN
INTRAVENOUS | Status: DC
  Administered 2017-04-17: 23:00:00 via INTRAVENOUS

## 2017-04-17 MED ORDER — ACETAMINOPHEN 325 MG PO TABS
650.0000 mg | ORAL_TABLET | Freq: Four times a day (QID) | ORAL | Status: DC | PRN
  Administered 2017-04-19: 650 mg via ORAL
  Filled 2017-04-17: qty 2

## 2017-04-17 NOTE — ED Triage Notes (Signed)
Pt arrives via [facility] for eval of afib and fever. Wife reports pt was at dentist today for w/u of fever. EMS also found pt to be in afib w/ no known hx. Arrives in afib rate of 100-140, febrile to 101.5 rectal. Denies any pain at this time, pt w/ known decub ulcers currently receiving treatment at wound center

## 2017-04-17 NOTE — ED Provider Notes (Signed)
[facility] Provider Note   CSN: [id number] Arrival date & time: 04/17/17  2001     History   Chief Complaint Chief Complaint  Patient presents with  . Atrial Fibrillation  . Code Sepsis    HPI [name] is a 84 y.o. male.  Pt presents to the ED today with fever and altered mental status.  Pt lives at home with his wife.  Per EMS, she noticed that he was not acting right.  No meds given for fever.  Pt was in a.fib upon EMS arrival.  The pt did go to the dentist today, but it is unclear what was done.  Pt also has some chronic decubitus ulcers that are getting treated at the wound center.  Pt is unable to give any hx as he is confused.      Past Medical History:  Diagnosis Date  . Abnormality of gait 09/22/2015  . Arthritis   . Bruises easily   . Cancer ([place])    skin CA removed from back  . Chronic insomnia 03/30/2015  . Complication of anesthesia    trouble waking up  . Constipation    from medications taken  . GERD (gastroesophageal reflux disease)   . Hypercholesteremia   . Hypertension   . Hypothyroidism   . Memory difficulty 09/22/2015  . Osteoarthritis   . Transient alteration of awareness 03/30/2015  . Vertigo    hx of    Patient Active Problem List   Diagnosis Date Noted  . Hallux rigidus, right foot 03/10/2016  . Lumbosacral spondylosis without myelopathy 10/29/2015  . Memory difficulty 09/22/2015  . Abnormality of gait 09/22/2015  . Seizures ([place]) 07/06/2015  . Hyperglycemia 07/06/2015  . Leukocytosis 07/06/2015  . Metabolic acidosis 08/67/6195  . Chronic pain 07/06/2015  . Chronic insomnia 03/30/2015  . Transient alteration of awareness 03/30/2015  . Abnormal liver function   . Sepsis ([place]) 01/31/2015  . Altered mental status 01/31/2015  . Essential hypertension 01/31/2015  . Constipation 01/31/2015  . Hypothyroidism 01/31/2015  . Acute encephalopathy 01/31/2015  . Bladder outlet obstruction  01/31/2015  . GERD (gastroesophageal reflux disease) 01/31/2015  . Chronic back pain 01/31/2015  . Sigmoid diverticulitis 01/31/2015  . Acute kidney injury ([place]) 01/31/2015    Past Surgical History:  Procedure Laterality Date  . BACK SURGERY    . CARDIAC CATHETERIZATION     Stent X 1  . CERVIX SURGERY    . CORONARY ANGIOPLASTY    . EYE SURGERY     Bilateral Cataract surgery   . HERNIA REPAIR    . I&D EXTREMITY Right 05/10/2015   Procedure: IRRIGATION AND DEBRIDEMENT EXTREMITY;  Surgeon: Leanora Cover, MD;  Location: [facility];  Service: Orthopedics;  Laterality: Right;  . KNEE ARTHROPLASTY     right knee X 2; left knee once  . LAMINECTOMY     X 6  . POSTERIOR CERVICAL FUSION/FORAMINOTOMY  01/28/2012   Procedure: POSTERIOR CERVICAL FUSION/FORAMINOTOMY LEVEL 3;  Surgeon: [name], MD;  Location: [facility];  Service: Neurosurgery;  Laterality: Left;  Posterior Cervical Five-Thoracic One Fusion, Arthrodesis with LEFT Cervical Seven-thoracic One Laminectomy, Foraminotomy and Resection of Synovial Cyst  . POSTERIOR CERVICAL FUSION/FORAMINOTOMY N/A 01/29/2013   Procedure: POSTERIOR CERVICAL FUSION/FORAMINOTOMY LEVEL 1 and C2-5 Posteriolateral Arthrodesis;  Surgeon: [name], MD;  Location: [facility];  Service: Neurosurgery;  Laterality: N/A;  C2-C3 Laminectomy C2-C3 posterior cervical arthrodesis  . TONSILLECTOMY  Home Medications    Prior to Admission medications   Medication Sig Start Date End Date Taking? Authorizing Provider  alendronate (FOSAMAX) 70 MG tablet Take 70 mg by mouth Once a week. Sundays 01/06/12   [provider]  amLODipine (NORVASC) 5 MG tablet Take 5 mg by mouth Daily.  12/23/11   [provider]  aspirin EC 81 MG tablet Take 81 mg by mouth at bedtime.    [provider]  atenolol (TENORMIN) 25 MG tablet Take 25 mg by mouth daily.    [provider]  calcium carbonate (OS-CAL) 600  MG TABS tablet Take 600 mg by mouth 2 (two) times daily with a meal.    [provider]  Cholecalciferol (VITAMIN D-3) 1000 units CAPS Take 1,000 Units by mouth 2 (two) times daily.    [provider]  Coenzyme Q10 (COQ10) 200 MG CAPS Take 200 mg by mouth at bedtime.    [provider]  cyanocobalamin 500 MCG tablet Take 500 mcg by mouth at bedtime.    [provider]  divalproex (DEPAKOTE) 250 MG DR tablet Take 3 tablets (750 mg total) by mouth every 12 (twelve) hours. Patient taking differently: Take 750 mg by mouth at bedtime. 3 tablets at night 07/09/15   Robbie Lis, MD  docusate sodium (COLACE) 100 MG capsule Take 100 mg by mouth 2 (two) times daily.     [provider]  gabapentin (NEURONTIN) 300 MG capsule Take 300 mg by mouth 2 (two) times daily.    [provider]  hydrochlorothiazide (HYDRODIURIL) 25 MG tablet Take 25 mg by mouth daily.    [provider]  HYDROcodone-acetaminophen (NORCO) 10-325 MG tablet Take 1 tablet by mouth every 4 (four) hours as needed for moderate pain.    [provider]  levothyroxine (SYNTHROID, LEVOTHROID) 150 MCG tablet Take 150 mcg by mouth daily before breakfast. 12/27/11   [provider]  losartan (COZAAR) 100 MG tablet Take 1 tablet (100 mg total) by mouth daily. 02/08/15   Reyne Dumas, MD  Multiple Vitamin (MULTIVITAMIN WITH MINERALS) TABS tablet Take 1 tablet by mouth at bedtime.    [provider]  nitroGLYCERIN (NITROSTAT) 0.4 MG SL tablet Place 0.4 mg under the tongue every 5 (five) minutes as needed for chest pain. Reported on 07/20/2015    [provider]  omeprazole (PRILOSEC) 20 MG capsule Take 20 mg by mouth Daily. 12/23/11   [provider]  polyethylene glycol (MIRALAX / GLYCOLAX) packet Take 8.5 g by mouth daily as needed for mild constipation.    [provider]  pravastatin (PRAVACHOL) 40 MG tablet Take 40 mg by mouth  daily. 10/29/14   [provider]  predniSONE (DELTASONE) 10 MG tablet Take 10 mg by mouth Daily.  01/06/12   [provider]  senna (SENOKOT) 8.6 MG tablet Take 1 tablet by mouth daily.     [provider]  sulindac (CLINORIL) 200 MG tablet Take 200 mg by mouth 2 (two) times daily.    [provider]  ZETIA 10 MG tablet Take 5 mg by mouth at bedtime.  10/15/11   [provider]    Family History Family History  Problem Relation Age of Onset  . Hypertension Mother   . Cancer Mother   . Kidney failure Father   . Heart disease Father     Social History Social History   Tobacco Use  . Smoking status: Former Research scientist (life sciences)  . Smokeless  tobacco: Never Used  Substance Use Topics  . Alcohol use: No    Comment: rare  . Drug use: No     Allergies   Demerol [meperidine] and Keppra [levetiracetam]   Review of Systems Review of Systems  Unable to perform ROS: Mental status change  Constitutional: Positive for fatigue.  All other systems reviewed and are negative.    Physical Exam Updated Vital Signs BP 104/65   Pulse (!) 103   Temp (!) 101.5 F (38.6 C) (Rectal)   Resp 16   Wt 90.7 kg (200 lb)   SpO2 96%   BMI 28.70 kg/m   Physical Exam  Constitutional: He appears well-developed. He appears distressed.  HENT:  Head: Normocephalic and atraumatic.  Right Ear: External ear normal.  Left Ear: External ear normal.  Nose: Nose normal.  Mouth/Throat: Mucous membranes are dry.  Eyes: Conjunctivae and EOM are normal. Pupils are equal, round, and reactive to light.  Neck: Normal range of motion. Neck supple.  Cardiovascular: Normal heart sounds and intact distal pulses. An irregularly irregular rhythm present. Tachycardia present.  Pulmonary/Chest: Effort normal and breath sounds normal.  Abdominal: Soft. Bowel sounds are normal.  Musculoskeletal: Normal range of motion.  Neurological: He is alert.  Pt is moving all 4 extremities.  He is  oriented to person and knows he's in a hospital.  Skin: Skin is warm. Capillary refill takes less than 2 seconds.  Nursing note and vitals reviewed.    ED Treatments / Results  Labs (all labs ordered are listed, but only abnormal results are displayed) Labs Reviewed  COMPREHENSIVE METABOLIC PANEL - Abnormal; Notable for the following components:      Result Value   Sodium 146 (*)    Glucose, Bld 111 (*)    BUN 28 (*)    Creatinine, Ser 1.50 (*)    Calcium 8.5 (*)    Total Protein 5.4 (*)    Albumin 3.4 (*)    AST 103 (*)    ALT 75 (*)    GFR calc non Af Amer 42 (*)    GFR calc Af Amer 49 (*)    All other components within normal limits  CBC WITH DIFFERENTIAL/PLATELET - Abnormal; Notable for the following components:   WBC 19.0 (*)    RBC 3.79 (*)    Hemoglobin 11.8 (*)    HCT 38.5 (*)    MCV 101.6 (*)    Platelets 142 (*)    Neutro Abs 16.6 (*)    Monocytes Absolute 1.3 (*)    All other components within normal limits  AMMONIA - Abnormal; Notable for the following components:   Ammonia 37 (*)    All other components within normal limits  URINALYSIS, ROUTINE W REFLEX MICROSCOPIC - Abnormal; Notable for the following components:   Color, Urine AMBER (*)    Ketones, ur 5 (*)    All other components within normal limits  VALPROIC ACID LEVEL - Abnormal; Notable for the following components:   Valproic Acid Lvl 37 (*)    All other components within normal limits  I-STAT CG4 LACTIC ACID, ED - Abnormal; Notable for the following components:   Lactic Acid, Venous 2.95 (*)    All other components within normal limits  URINE CULTURE  CULTURE, BLOOD (ROUTINE X 2)  CULTURE, BLOOD (ROUTINE X 2)  TSH  INFLUENZA PANEL BY PCR (TYPE A & B)  CBG MONITORING, ED    EKG  EKG Interpretation  Date/Time:  Wednesday April 17 2017 20:05:24 EST Ventricular Rate:  117 PR Interval:    QRS Duration: 93 QT Interval:  334 QTC Calculation: 466 R Axis:   28 Text Interpretation:   Atrial fibrillation Borderline low voltage, extremity leads Minimal ST depression, inferior leads afib is new Confirmed by Isla Pence [phone]) on 04/17/2017 8:53:24 PM       Radiology Ct Head Wo Contrast  Result Date: 04/17/2017 CLINICAL DATA:  Acute onset of altered mental status. Atrial fibrillation. EXAM: CT HEAD WITHOUT CONTRAST TECHNIQUE: Contiguous axial images were obtained from the base of the skull through the vertex without intravenous contrast. COMPARISON:  CT of the head performed 07/06/2015 FINDINGS: Brain: No evidence of acute infarction, hemorrhage, hydrocephalus, extra-axial collection or mass lesion / mass effect. Prominence of the ventricles and sulci reflects mild cortical volume loss. Scattered periventricular white matter change likely reflects small vessel ischemic microangiopathy. The brainstem and fourth ventricle are within normal limits. The basal ganglia are unremarkable in appearance. The cerebral hemispheres demonstrate grossly normal gray-white differentiation. No mass effect or midline shift is seen. Vascular: No hyperdense vessel or unexpected calcification. Skull: There is no evidence of fracture; visualized osseous structures are unremarkable in appearance. Sinuses/Orbits: The orbits are within normal limits. The paranasal sinuses and mastoid air cells are well-aerated. Other: No significant soft tissue abnormalities are seen. IMPRESSION: 1. No acute intracranial pathology seen on CT. 2. Mild cortical volume loss and scattered small vessel ischemic microangiopathy. Electronically Signed   By: Garald Balding M.D.   On: 04/17/2017 21:35   Dg Chest Portable 1 View  Result Date: 04/17/2017 CLINICAL DATA:  Acute onset of fever.  Atrial fibrillation. EXAM: PORTABLE CHEST 1 VIEW COMPARISON:  Chest radiograph performed 01/31/2015, and CT of the chest performed 07/06/2015 FINDINGS: The lungs are well-aerated. Left basilar airspace opacity could reflect pneumonia. There is no  evidence of pleural effusion or pneumothorax. The cardiomediastinal silhouette is mildly enlarged. No acute osseous abnormalities are seen. IMPRESSION: 1. Left basilar airspace opacity could reflect pneumonia. 2. Mild cardiomegaly. Electronically Signed   By: Garald Balding M.D.   On: 04/17/2017 21:58    Procedures Procedures (including critical care time)  Medications Ordered in ED Medications  sodium chloride 0.9 % bolus 1,000 mL (0 mLs Intravenous Stopped 04/17/17 2119)    And  0.9 %  sodium chloride infusion (not administered)  sodium chloride 0.9 % bolus 1,000 mL (0 mLs Intravenous Stopped 04/17/17 2115)    And  sodium chloride 0.9 % bolus 1,000 mL (1,000 mLs Intravenous New Bag/Given 04/17/17 2115)    And  sodium chloride 0.9 % bolus 1,000 mL (not administered)  azithromycin (ZITHROMAX) 500 mg in dextrose 5 % 250 mL IVPB (500 mg Intravenous New Bag/Given 04/17/17 2145)  HYDROcodone-acetaminophen (NORCO) 10-325 MG per tablet 1 tablet (not administered)  acetaminophen (TYLENOL) tablet 1,000 mg (1,000 mg Oral Given 04/17/17 2056)  cefTRIAXone (ROCEPHIN) 1 g in dextrose 5 % 50 mL IVPB (0 g Intravenous Stopped 04/17/17 2215)     Initial Impression / Assessment and Plan / ED Course  I have reviewed the triage vital signs and the nursing notes.  Pertinent labs & imaging results that were available during my care of the patient were reviewed by me and considered in my medical decision making (see chart for details).  CHADVASC2: 4 (age, htn, vascular disease)      This patient meets SIRS Criteria and may be septic. SIRS = Systemic Inflammatory Response Syndrome  Best Practice Recommends:   Notify  the nurse immediately to increase monitoring of patient.   The recent clinical data is shown below. Vitals:   04/17/17 2039 04/17/17 2045 04/17/17 2145 04/17/17 2200  BP:  101/61 (!) 94/54 104/65  Pulse:  (!) 112 (!) 102 (!) 103  Resp:  19 17 16   Temp:      TempSrc:      SpO2:  94% 96% 96%    Weight: 90.7 kg (200 lb)      Pt's mental status is much improved.  The hr has improved with IVFs.  The pt's pna was tx'd with rocephin and zithromax.    CRITICAL CARE Performed by: Isla Pence   Total critical care time: 30 minutes  Critical care time was exclusive of separately billable procedures and treating other patients.  Critical care was necessary to treat or prevent imminent or life-threatening deterioration.  Critical care was time spent personally by me on the following activities: development of treatment plan with patient and/or surrogate as well as nursing, discussions with consultants, evaluation of patient's response to treatment, examination of patient, obtaining history from patient or surrogate, ordering and performing treatments and interventions, ordering and review of laboratory studies, ordering and review of radiographic studies, pulse oximetry and re-evaluation of patient's condition.    Final Clinical Impressions(s) / ED Diagnoses   Final diagnoses:  Sepsis, due to unspecified organism [facility])  Atrial fibrillation with RVR ([place])  Community acquired pneumonia of left lower lobe of lung ([place])  CKD (chronic kidney disease) stage 3, GFR 30-59 ml/min [facility])    ED Discharge Orders    None       Isla Pence, MD 04/17/17 2224

## 2017-04-17 NOTE — ED Notes (Signed)
CODE SEPSIS ACTIVATED RN FRANCES AWARE

## 2017-04-17 NOTE — H&P (Addendum)
History and Physical    [name] [name] DOB: [birth year] DOA: 04/17/2017  PCP: [name], MD  Patient coming from: Home.  Chief Complaint: Confusion.  HPI: [name] is a 84 y.o. male with history of chronic wound of the right leg, hypertension, chronic kidney disease, seizures, memory loss, hypothyroidism was brought to the ER after patient was found to be confused.  As per patient's wife who provided the history patient had gone to the dentist this morning and had reached home until then patient was appearing normal.  Following which patient gradually became more confused.  Since patient became confused patient's wife called EMS and patient was found to be in A. fib with RVR and febrile.  Patient was brought to the ER.  Before reaching ER patient was given 1 L fluid bolus for sepsis.  Patient did not have any chest pain or shortness of breath nausea vomiting or diarrhea.  Patient is on chronic steroids for arthritis.  ED Course: In the ER patient was found to be in A. fib with RVR and mildly hypotensive with elevated lactate level patient was given fluid bolus per sepsis protocol and blood cultures obtained.  Chest x-ray shows infiltrates concerning for pneumonia.  At that point patient was having some cough.  Patient also has chronic wound on the right leg.  Which has been dressed and patient wife states he has been following with wound center and has been in chronic drainage.  Patient's A. fib with RVR improved with fluids.  Patient's blood pressure also improved with fluids.  Patient is being admitted for sepsis likely from pneumonia could also be from the wound of the right leg.  He was started on stress dose steroids.  Review of Systems: As per HPI, rest all negative.   Past Medical History:  Diagnosis Date  . Abnormality of gait 09/22/2015  . Arthritis   . Bruises easily   . Cancer ([place])    skin CA removed from back  . Chronic insomnia 03/30/2015  .  Complication of anesthesia    trouble waking up  . Constipation    from medications taken  . GERD (gastroesophageal reflux disease)   . Hypercholesteremia   . Hypertension   . Hypothyroidism   . Memory difficulty 09/22/2015  . Osteoarthritis   . Transient alteration of awareness 03/30/2015  . Vertigo    hx of    Past Surgical History:  Procedure Laterality Date  . BACK SURGERY    . CARDIAC CATHETERIZATION     Stent X 1  . CERVIX SURGERY    . CORONARY ANGIOPLASTY    . EYE SURGERY     Bilateral Cataract surgery   . HERNIA REPAIR    . I&D EXTREMITY Right 05/10/2015   Procedure: IRRIGATION AND DEBRIDEMENT EXTREMITY;  Surgeon: Leanora Cover, MD;  Location: [facility];  Service: Orthopedics;  Laterality: Right;  . KNEE ARTHROPLASTY     right knee X 2; left knee once  . LAMINECTOMY     X 6  . POSTERIOR CERVICAL FUSION/FORAMINOTOMY  01/28/2012   Procedure: POSTERIOR CERVICAL FUSION/FORAMINOTOMY LEVEL 3;  Surgeon: [name], MD;  Location: [facility];  Service: Neurosurgery;  Laterality: Left;  Posterior Cervical Five-Thoracic One Fusion, Arthrodesis with LEFT Cervical Seven-thoracic One Laminectomy, Foraminotomy and Resection of Synovial Cyst  . POSTERIOR CERVICAL FUSION/FORAMINOTOMY N/A 01/29/2013   Procedure: POSTERIOR CERVICAL FUSION/FORAMINOTOMY LEVEL 1 and C2-5 Posteriolateral Arthrodesis;  Surgeon: [name], MD;  Location: [facility];  Service: Neurosurgery;  Laterality: N/A;  C2-C3 Laminectomy C2-C3 posterior cervical arthrodesis  . TONSILLECTOMY       reports that he has quit smoking. he has never used smokeless tobacco. He reports that he does not drink alcohol or use drugs.  Allergies  Allergen Reactions  . Demerol [Meperidine] Nausea And Vomiting  . Keppra [Levetiracetam] Other (See Comments)    Causes sleepiness    Family History  Problem Relation Age of Onset  . Hypertension Mother   . Cancer Mother   . Kidney failure Father   .  Heart disease Father     Prior to Admission medications   Medication Sig Start Date End Date Taking? Authorizing Provider  alendronate (FOSAMAX) 70 MG tablet Take 70 mg by mouth every Sunday.  01/06/12  Yes [provider]  amLODipine (NORVASC) 5 MG tablet Take 5 mg by mouth Daily.  12/23/11  Yes [provider]  aspirin EC 81 MG tablet Take 81 mg by mouth at bedtime.   Yes [provider]  atenolol (TENORMIN) 25 MG tablet Take 25 mg by mouth daily.   Yes [provider]  calcium carbonate (OS-CAL) 600 MG TABS tablet Take 600 mg by mouth 2 (two) times daily with a meal.   Yes [provider]  Cholecalciferol (VITAMIN D-3) 1000 units CAPS Take 1,000 Units by mouth 2 (two) times daily.   Yes [provider]  Coenzyme Q10 (COQ10) 200 MG CAPS Take 200 mg by mouth at bedtime.   Yes [provider]  cyanocobalamin 500 MCG tablet Take 500 mcg by mouth at bedtime.   Yes [provider]  divalproex (DEPAKOTE) 250 MG DR tablet Take 3 tablets (750 mg total) by mouth every 12 (twelve) hours. Patient taking differently: Take 250-750 mg by mouth See admin instructions. 250 mg by mouth in the morning and 750 mg at bedtime 07/09/15  Yes Robbie Lis, MD  docusate sodium (COLACE) 100 MG capsule Take 200 mg by mouth 2 (two) times daily.    Yes [provider]  gabapentin (NEURONTIN) 300 MG capsule Take 300 mg by mouth 2 (two) times daily.   Yes [provider]  hydrochlorothiazide (HYDRODIURIL) 25 MG tablet Take 25 mg by mouth daily.   Yes [provider]  HYDROcodone-acetaminophen (NORCO) 10-325 MG tablet Take 1 tablet by mouth See admin instructions. 1 tablet by mouth every four to six hours   Yes [provider]  levothyroxine (SYNTHROID, LEVOTHROID) 150 MCG tablet Take 150 mcg by mouth daily before breakfast. 12/27/11  Yes [provider]  losartan (COZAAR) 100 MG tablet Take 1 tablet (100 mg  total) by mouth daily. 02/08/15  Yes Reyne Dumas, MD  Multiple Vitamin (MULTIVITAMIN WITH MINERALS) TABS tablet Take 1 tablet by mouth at bedtime.   Yes [provider]  nitroGLYCERIN (NITROSTAT) 0.4 MG SL tablet Place 0.4 mg under the tongue every 5 (five) minutes as needed for chest pain. Reported on 07/20/2015   Yes [provider]  omeprazole (PRILOSEC) 20 MG capsule Take 20 mg by mouth Daily. 12/23/11  Yes [provider]  pravastatin (PRAVACHOL) 40 MG tablet Take 40 mg by mouth at bedtime.  10/29/14  Yes [provider]  predniSONE (DELTASONE) 10 MG tablet Take 10 mg by mouth Daily.  01/06/12  Yes [provider]  senna (SENOKOT) 8.6 MG tablet Take 1 tablet by mouth 2 (two) times daily.    Yes [provider]  sulindac (CLINORIL) 200 MG tablet Take 200 mg by mouth 2 (two) times daily.   Yes [provider]  vitamin A 8000 UNIT capsule Take 8,000 Units by mouth 2 (two) times daily.   Yes [provider]  vitamin C (ASCORBIC ACID) 500 MG tablet Take 500 mg by mouth every morning.   Yes [provider]  ZETIA 10 MG tablet Take 5 mg by mouth at bedtime.  10/15/11  Yes [provider]  zinc gluconate 50 MG tablet Take 50 mg by mouth every morning.   Yes [provider]    Physical Exam: Vitals:   04/17/17 2045 04/17/17 2145 04/17/17 2200 04/17/17 2230  BP: 101/61 (!) 94/54 104/65 95/77  Pulse: (!) 112 (!) 102 (!) 103 97  Resp: 19 17 16 20   Temp:      TempSrc:      SpO2: 94% 96% 96% 100%  Weight:          Constitutional: Moderately built and nourished. Vitals:   04/17/17 2045 04/17/17 2145 04/17/17 2200 04/17/17 2230  BP: 101/61 (!) 94/54 104/65 95/77  Pulse: (!) 112 (!) 102 (!) 103 97  Resp: 19 17 16 20   Temp:      TempSrc:      SpO2: 94% 96% 96% 100%  Weight:       Eyes: Anicteric no pallor. ENMT: No discharge from the ears eyes nose or mouth. Neck: No mass felt.  No neck  rigidity.  No JVD appreciated. Respiratory: No rhonchi or crepitations. Cardiovascular: S1-S2 heard no murmurs appreciated. Abdomen: Soft nontender bowel sounds present. Musculoskeletal: No edema.  Right leg is under dressing. Skin: Right leg is under dressing. Neurologic: Alert awake oriented to time place and person.  Moves all extremities. Psychiatric: Appears normal.  Normal affect.   Labs on Admission: I have personally reviewed following labs and imaging studies  CBC: Recent Labs  Lab 04/17/17 1954  WBC 19.0*  NEUTROABS 16.6*  HGB 11.8*  HCT 38.5*  MCV 101.6*  PLT 130*   Basic Metabolic Panel: Recent Labs  Lab 04/17/17 1954  NA 146*  K 3.7  CL 108  CO2 24  GLUCOSE 111*  BUN 28*  CREATININE 1.50*  CALCIUM 8.5*   GFR: CrCl cannot be calculated (Unknown ideal weight.). Liver Function Tests: Recent Labs  Lab 04/17/17 1954  AST 103*  ALT 75*  ALKPHOS 75  BILITOT 1.2  PROT 5.4*  ALBUMIN 3.4*   No results for input(s): LIPASE, AMYLASE in the last 168 hours. Recent Labs  Lab 04/17/17 2015  AMMONIA 37*   Coagulation Profile: No results for input(s): INR, PROTIME in the last 168 hours. Cardiac Enzymes: No results for input(s): CKTOTAL, CKMB, CKMBINDEX, TROPONINI in the last 168 hours. BNP (last 3 results) No results for input(s): PROBNP in the last 8760 hours. HbA1C: No results for input(s): HGBA1C in the last 72 hours. CBG: No results for input(s): GLUCAP in the last 168 hours. Lipid Profile: No results for input(s): CHOL, HDL, LDLCALC, TRIG, CHOLHDL, LDLDIRECT in the last 72 hours. Thyroid Function Tests: Recent Labs    04/17/17 1954  TSH 1.782   Anemia Panel: No results for input(s): VITAMINB12, FOLATE, FERRITIN, TIBC, IRON, RETICCTPCT in the last 72 hours. Urine analysis:    Component Value Date/Time   COLORURINE AMBER (A) 04/17/2017 2014   APPEARANCEUR CLEAR 04/17/2017 2014   LABSPEC 1.021 04/17/2017 2014   PHURINE 5.0 04/17/2017 2014     GLUCOSEU NEGATIVE 04/17/2017  2014   [place] NEGATIVE 04/17/2017 2014   [place] NEGATIVE 04/17/2017 2014   KETONESUR 5 (A) 04/17/2017 2014   PROTEINUR NEGATIVE 04/17/2017 2014   UROBILINOGEN 1.0 09/16/2007 1212   NITRITE NEGATIVE 04/17/2017 2014   LEUKOCYTESUR NEGATIVE 04/17/2017 2014   Sepsis Labs: @LABRCNTIP (procalcitonin:4,lacticidven:4) )No results found for this or any previous visit (from the past 240 hour(s)).   Radiological Exams on Admission: Ct Head Wo Contrast  Result Date: 04/17/2017 CLINICAL DATA:  Acute onset of altered mental status. Atrial fibrillation. EXAM: CT HEAD WITHOUT CONTRAST TECHNIQUE: Contiguous axial images were obtained from the base of the skull through the vertex without intravenous contrast. COMPARISON:  CT of the head performed 07/06/2015 FINDINGS: Brain: No evidence of acute infarction, hemorrhage, hydrocephalus, extra-axial collection or mass lesion / mass effect. Prominence of the ventricles and sulci reflects mild cortical volume loss. Scattered periventricular white matter change likely reflects small vessel ischemic microangiopathy. The brainstem and fourth ventricle are within normal limits. The basal ganglia are unremarkable in appearance. The cerebral hemispheres demonstrate grossly normal gray-white differentiation. No mass effect or midline shift is seen. Vascular: No hyperdense vessel or unexpected calcification. Skull: There is no evidence of fracture; visualized osseous structures are unremarkable in appearance. Sinuses/Orbits: The orbits are within normal limits. The paranasal sinuses and mastoid air cells are well-aerated. Other: No significant soft tissue abnormalities are seen. IMPRESSION: 1. No acute intracranial pathology seen on CT. 2. Mild cortical volume loss and scattered small vessel ischemic microangiopathy. Electronically Signed   By: Garald Balding M.D.   On: 04/17/2017 21:35   Dg Chest Portable 1 View  Result Date: 04/17/2017 CLINICAL  DATA:  Acute onset of fever.  Atrial fibrillation. EXAM: PORTABLE CHEST 1 VIEW COMPARISON:  Chest radiograph performed 01/31/2015, and CT of the chest performed 07/06/2015 FINDINGS: The lungs are well-aerated. Left basilar airspace opacity could reflect pneumonia. There is no evidence of pleural effusion or pneumothorax. The cardiomediastinal silhouette is mildly enlarged. No acute osseous abnormalities are seen. IMPRESSION: 1. Left basilar airspace opacity could reflect pneumonia. 2. Mild cardiomegaly. Electronically Signed   By: Garald Balding M.D.   On: 04/17/2017 21:58    EKG: Independently reviewed.  A. fib with RVR.  Assessment/Plan Principal Problem:   Sepsis ([place]) Active Problems:   Essential hypertension   Hypothyroidism   Acute encephalopathy   Seizures ([facility])   Memory difficulty   CAP (community acquired pneumonia)   Atrial fibrillation with RVR ([place])    1. Sepsis likely could be from pneumonia versus chronic right leg wound -influenza PCR is pending.  Follow blood cultures sputum cultures urine for Legionella strep antigen.  Continue with ceftriaxone and Zithromax.  Will check sed rate and x-ray of the right leg.  Have consulted wound team. 2. A. fib with RVR new onset.  Likely precipitated by sepsis.  Patient's chads 2 vasc score is at least 2 for which patient has been started on heparin.  Will check TSH 2D echo cycle cardiac markers. 3. Acute encephalopathy likely secondary to sepsis which is improving.  CT head is unremarkable.  Follows commands at this time. 4. History of seizures on Keppra. 5. Patient is also on Depakote.  Will check Depakote levels. 6. Chronic kidney disease stage III creatinine is slightly worsened from baseline.  We will continue to hydrate and follow metabolic panel. 7. Hypertension holding antihypertensives secondary to low normal blood pressure from sepsis. 8. Microcytic anemia check anemia panel. 9. Hypothyroidism on Synthroid check TSH. 10. Patient  is  on chronic steroids for arthritis and has been placed on stress dose steroids. 11. Chronic pain we will continue home medications once patient is stable.   DVT prophylaxis: Heparin. Code Status: Full code. Family Communication: Patient's wife. Disposition Plan: Home. Consults called: Cardiology. Admission status: Inpatient.   [name] MD [facility] Pager [phone].  If 7PM-7AM, please contact night-coverage [email or web address] Password [email or web address]  04/17/2017, 11:17 PM

## 2017-04-17 NOTE — ED Notes (Signed)
Spoke to MD [facility] about administering antibiotics w/in 60 minute time frame for code sepsis protocol. At this time, MD does not want abx ordered until source is confirmed. Awaiting new orders

## 2017-04-18 ENCOUNTER — Inpatient Hospital Stay (HOSPITAL_COMMUNITY): Payer: Medicare Other

## 2017-04-18 LAB — CBC WITH DIFFERENTIAL/PLATELET
Basophils Absolute: 0 10*3/uL (ref 0.0–0.1)
Basophils Relative: 0 %
Eosinophils Absolute: 0 10*3/uL (ref 0.0–0.7)
Eosinophils Relative: 0 %
HCT: 34.7 % — ABNORMAL LOW (ref 39.0–52.0)
Hemoglobin: 10.6 g/dL — ABNORMAL LOW (ref 13.0–17.0)
Lymphocytes Relative: 3 %
Lymphs Abs: 0.8 10*3/uL (ref 0.7–4.0)
MCH: 31.4 pg (ref 26.0–34.0)
MCHC: 30.5 g/dL (ref 30.0–36.0)
MCV: 102.7 fL — ABNORMAL HIGH (ref 78.0–100.0)
Monocytes Absolute: 1.1 10*3/uL — ABNORMAL HIGH (ref 0.1–1.0)
Monocytes Relative: 5 %
Neutro Abs: 22.6 10*3/uL — ABNORMAL HIGH (ref 1.7–7.7)
Neutrophils Relative %: 92 %
Platelets: 151 10*3/uL (ref 150–400)
RBC: 3.38 MIL/uL — ABNORMAL LOW (ref 4.22–5.81)
RDW: 15.3 % (ref 11.5–15.5)
WBC: 24.6 10*3/uL — ABNORMAL HIGH (ref 4.0–10.5)

## 2017-04-18 LAB — LACTIC ACID, PLASMA
Lactic Acid, Venous: 2.5 mmol/L (ref 0.5–1.9)
Lactic Acid, Venous: 2.6 mmol/L (ref 0.5–1.9)

## 2017-04-18 LAB — COMPREHENSIVE METABOLIC PANEL
ALT: 70 U/L — ABNORMAL HIGH (ref 17–63)
AST: 77 U/L — ABNORMAL HIGH (ref 15–41)
Albumin: 2.9 g/dL — ABNORMAL LOW (ref 3.5–5.0)
Alkaline Phosphatase: 66 U/L (ref 38–126)
Anion gap: 15 (ref 5–15)
BUN: 27 mg/dL — ABNORMAL HIGH (ref 6–20)
CO2: 18 mmol/L — ABNORMAL LOW (ref 22–32)
Calcium: 7.4 mg/dL — ABNORMAL LOW (ref 8.9–10.3)
Chloride: 110 mmol/L (ref 101–111)
Creatinine, Ser: 1.46 mg/dL — ABNORMAL HIGH (ref 0.61–1.24)
GFR calc Af Amer: 50 mL/min — ABNORMAL LOW (ref >60)
GFR calc non Af Amer: 43 mL/min — ABNORMAL LOW (ref >60)
Glucose, Bld: 105 mg/dL — ABNORMAL HIGH (ref 65–99)
Potassium: 3.9 mmol/L (ref 3.5–5.1)
Sodium: 143 mmol/L (ref 135–145)
Total Bilirubin: 1.1 mg/dL (ref 0.3–1.2)
Total Protein: 4.9 g/dL — ABNORMAL LOW (ref 6.5–8.1)

## 2017-04-18 LAB — TROPONIN I
Troponin I: 0.14 ng/mL (ref <0.03)
Troponin I: 0.35 ng/mL (ref <0.03)
Troponin I: 0.49 ng/mL (ref <0.03)

## 2017-04-18 LAB — RETICULOCYTES
RBC.: 3.22 MIL/uL — ABNORMAL LOW (ref 4.22–5.81)
Retic Count, Absolute: 86.9 10*3/uL (ref 19.0–186.0)
Retic Ct Pct: 2.7 % (ref 0.4–3.1)

## 2017-04-18 LAB — VITAMIN B12: Vitamin B-12: 884 pg/mL (ref 180–914)

## 2017-04-18 LAB — PROTIME-INR
INR: 1.34
Prothrombin Time: 16.4 seconds — ABNORMAL HIGH (ref 11.4–15.2)

## 2017-04-18 LAB — PROCALCITONIN: Procalcitonin: 24.36 ng/mL

## 2017-04-18 LAB — IRON AND TIBC
Iron: 12 ug/dL — ABNORMAL LOW (ref 45–182)
Saturation Ratios: 5 % — ABNORMAL LOW (ref 17.9–39.5)
TIBC: 228 ug/dL — ABNORMAL LOW (ref 250–450)
UIBC: 216 ug/dL

## 2017-04-18 LAB — MAGNESIUM: Magnesium: 1.6 mg/dL — ABNORMAL LOW (ref 1.7–2.4)

## 2017-04-18 LAB — ECHOCARDIOGRAM COMPLETE
Height: 70 in
Weight: 3200 oz

## 2017-04-18 LAB — HEPARIN LEVEL (UNFRACTIONATED)
Heparin Unfractionated: 0.71 IU/mL — ABNORMAL HIGH (ref 0.30–0.70)
Heparin Unfractionated: 0.34 IU/mL (ref 0.30–0.70)

## 2017-04-18 LAB — VALPROIC ACID LEVEL: Valproic Acid Lvl: 49 ug/mL — ABNORMAL LOW (ref 50.0–100.0)

## 2017-04-18 LAB — SEDIMENTATION RATE: Sed Rate: 15 mm/hr (ref 0–16)

## 2017-04-18 LAB — FOLATE: Folate: 36.6 ng/mL (ref >5.9)

## 2017-04-18 LAB — APTT: aPTT: 40 seconds — ABNORMAL HIGH (ref 24–36)

## 2017-04-18 LAB — FERRITIN: Ferritin: 202 ng/mL (ref 24–336)

## 2017-04-18 MED ORDER — HEPARIN BOLUS VIA INFUSION
4000.0000 [IU] | Freq: Once | INTRAVENOUS | Status: AC
Start: 2017-04-18 — End: 2017-04-18
  Administered 2017-04-18: 4000 [IU] via INTRAVENOUS
  Filled 2017-04-18: qty 4000

## 2017-04-18 MED ORDER — MAGNESIUM SULFATE 2 GM/50ML IV SOLN
2.0000 g | Freq: Once | INTRAVENOUS | Status: AC
  Administered 2017-04-18: 2 g via INTRAVENOUS
  Filled 2017-04-18: qty 50

## 2017-04-18 MED ORDER — SODIUM CHLORIDE 0.9 % IV SOLN: 1250.0000 mg | INTRAVENOUS | Status: DC

## 2017-04-18 MED ORDER — HYDROCODONE-ACETAMINOPHEN 10-325 MG PO TABS
1.0000 | ORAL_TABLET | Freq: Four times a day (QID) | ORAL | Status: DC | PRN
  Administered 2017-04-18 – 2017-04-25 (×16): 1 via ORAL
  Filled 2017-04-18 (×18): qty 1

## 2017-04-18 MED ORDER — VANCOMYCIN HCL IN DEXTROSE 750-5 MG/150ML-% IV SOLN
750.0000 mg | Freq: Two times a day (BID) | INTRAVENOUS | Status: DC
  Administered 2017-04-18 – 2017-04-21 (×6): 750 mg via INTRAVENOUS
  Filled 2017-04-18 (×8): qty 150

## 2017-04-18 MED ORDER — HEPARIN (PORCINE) IN NACL 100-0.45 UNIT/ML-% IJ SOLN
1500.0000 [IU]/h | INTRAMUSCULAR | Status: DC
  Administered 2017-04-18: 1200 [IU]/h via INTRAVENOUS
  Administered 2017-04-18 – 2017-04-19 (×2): 1050 [IU]/h via INTRAVENOUS
  Administered 2017-04-21: 1200 [IU]/h via INTRAVENOUS
  Filled 2017-04-18 (×6): qty 250

## 2017-04-18 MED ORDER — VANCOMYCIN HCL 10 G IV SOLR
1750.0000 mg | Freq: Once | INTRAVENOUS | Status: AC
  Administered 2017-04-18: 1750 mg via INTRAVENOUS
  Filled 2017-04-18 (×2): qty 1750

## 2017-04-18 MED ORDER — VANCOMYCIN HCL 10 G IV SOLR
1750.0000 mg | Freq: Once | INTRAVENOUS | Status: DC
  Filled 2017-04-18: qty 1750

## 2017-04-18 NOTE — ED Notes (Signed)
Echo in room

## 2017-04-18 NOTE — Progress Notes (Signed)
Pharmacy Antibiotic Note  [name] is a 84 y.o. male admitted on 04/17/2017 with sepsis.  Pharmacy has been consulted for vancomycin dosing. Also on concurrent ceftriaxone and azithromycin for CAP coverage.  Presented with AMS and confusion. CXR showing infiltrates concerning for pneumonia. Also has a chronic wound on R leg. WBC increased from 19 to 24.6, although patient is currently receiving steroids. PCT is 24.36. LA is 2.95> 2.6. Renal function improved slightly (Scr 1.5>1.46, CrCl ~40 mL/min).   Plan: Vancomycin 1750 mg IV once  Vancomycin 750 mg IV every 12 hours Monitor renal function, cx results, clinical picture, and VT as needed   Height: 5\' 10"  (177.8 cm) Weight: 200 lb (90.7 kg) IBW/kg (Calculated) : 73  Temp (24hrs), Avg:100 F (37.8 C), Min:98.4 F (36.9 C), Max:101.5 F (38.6 C)  Recent Labs  Lab 04/17/17 1954 04/17/17 2017 04/18/17 0103 04/18/17 0532  WBC 19.0*  --   --  24.6*  CREATININE 1.50*  --   --  1.46*  LATICACIDVEN  --  2.95* 2.5* 2.6*    Estimated Creatinine Clearance: 45 mL/min (A) (by C-G formula based on SCr of 1.46 mg/dL (H)).    Allergies  Allergen Reactions  . Demerol [Meperidine] Nausea And Vomiting  . Keppra [Levetiracetam] Other (See Comments)    Causes sleepiness    Antimicrobials this admission: Azithromycin 2/6 >>  Ceftriaxone 2/6 >>  Vanc 2/7 >>  Dose adjustments this admission: N/A  Microbiology results: 2/6 BCx: sent 2/6 UCx: sent  2/7 Sputum: ordered   Thank you for allowing pharmacy to be a part of this patient's care.  Doylene Canard, PharmD Clinical Pharmacist  Pager: [phone] Clinical Phone for 04/18/2017 until 3:30pm: [phone] If after 3:30pm, please call main pharmacy at [phone] 04/18/2017 7:39 AM

## 2017-04-18 NOTE — Progress Notes (Signed)
ANTICOAGULATION CONSULT NOTE - Follow Up Consult  Pharmacy Consult for heparin Indication: atrial fibrillation  Allergies  Allergen Reactions  . Demerol [Meperidine] Nausea And Vomiting  . Keppra [Levetiracetam] Other (See Comments)    Causes sleepiness    Patient Measurements: Height: 5\' 10"  (177.8 cm) Weight: 200 lb (90.7 kg) IBW/kg (Calculated) : 73  Vital Signs: Temp: 98.4 F (36.9 C) (02/07 0612) Temp Source: Oral (02/07 0612) BP: 122/74 (02/07 1130) Pulse Rate: 74 (02/07 1145)  Labs: Recent Labs    04/17/17 1954 04/18/17 0103 04/18/17 0532 04/18/17 1000  HGB 11.8*  --  10.6*  --   HCT 38.5*  --  34.7*  --   PLT 142*  --  151  --   APTT  --  40*  --   --   LABPROT  --  16.4*  --   --   INR  --  1.34  --   --   HEPARINUNFRC  --   --   --  0.71*  CREATININE 1.50*  --  1.46*  --   TROPONINI  --  0.14* 0.35*  --     Medical History: Past Medical History:  Diagnosis Date  . Abnormality of gait 09/22/2015  . Arthritis   . Bruises easily   . Cancer ([place])    skin CA removed from back  . Chronic insomnia 03/30/2015  . Complication of anesthesia    trouble waking up  . Constipation    from medications taken  . GERD (gastroesophageal reflux disease)   . Hypercholesteremia   . Hypertension   . Hypothyroidism   . Memory difficulty 09/22/2015  . Osteoarthritis   . Transient alteration of awareness 03/30/2015  . Vertigo    hx of     Assessment: 84yo male c/o fever, EMS found pt to be in Afib, no known h/o Afib, in ED HR was 100-140s, to begin heparin.  Heparin level slightly supratherapeutic at 0.71, on 1200 units/hr. Hgb down slightly to 10.6, platelet function is 151. No signs/symptoms of bleeding - did have slight oozing at site of chronic wounds earlier that has since resolved. No infusion issues.   Goal of Therapy:  Heparin level 0.3-0.7 units/ml Monitor platelets by anticoagulation protocol: Yes   Plan:  Decrease heparin infusion to 1050 units/hr   Obtain heparin level in 8 hours Monitor daily heparin level and CBC Monitor for signs/symptoms of bleeding  Doylene Canard, PharmD Clinical Pharmacist  Pager: [phone] Clinical Phone for 04/18/2017 until 3:30pm: [phone] If after 3:30pm, please call main pharmacy at [phone]  04/18/2017,1:14 PM

## 2017-04-18 NOTE — Progress Notes (Signed)
  Echocardiogram 2D Echocardiogram has been performed.  [name] F 04/18/2017, 9:48 AM

## 2017-04-18 NOTE — ED Notes (Signed)
Hospital bed ordered.

## 2017-04-18 NOTE — Progress Notes (Signed)
Iv infiltrated in right AC.  Removed per protocol and applied pressure dressing and kerlix.

## 2017-04-18 NOTE — ED Notes (Signed)
Patient transported to X-ray 

## 2017-04-18 NOTE — Progress Notes (Signed)
ANTICOAGULATION CONSULT NOTE - Follow Up Consult  Pharmacy Consult for heparin Indication: atrial fibrillation  Allergies  Allergen Reactions  . Demerol [Meperidine] Nausea And Vomiting  . Keppra [Levetiracetam] Other (See Comments)    Causes sleepiness    Patient Measurements: Height: 5\' 10"  (177.8 cm) Weight: 200 lb (90.7 kg) IBW/kg (Calculated) : 73  Vital Signs: Temp: 97.4 F (36.3 C) (02/07 1936) Temp Source: Oral (02/07 1936) BP: 149/88 (02/07 1936) Pulse Rate: 83 (02/07 1936)  Labs: Recent Labs    04/17/17 1954 04/18/17 0103 04/18/17 0532 04/18/17 1000 04/18/17 1114 04/18/17 2121  HGB 11.8*  --  10.6*  --   --   --   HCT 38.5*  --  34.7*  --   --   --   PLT 142*  --  151  --   --   --   APTT  --  40*  --   --   --   --   LABPROT  --  16.4*  --   --   --   --   INR  --  1.34  --   --   --   --   HEPARINUNFRC  --   --   --  0.71*  --  0.34  CREATININE 1.50*  --  1.46*  --   --   --   TROPONINI  --  0.14* 0.35*  --  0.49*  --     Medical History: Past Medical History:  Diagnosis Date  . Abnormality of gait 09/22/2015  . Arthritis   . Bruises easily   . Cancer ([place])    skin CA removed from back  . Chronic insomnia 03/30/2015  . Complication of anesthesia    trouble waking up  . Constipation    from medications taken  . GERD (gastroesophageal reflux disease)   . Hypercholesteremia   . Hypertension   . Hypothyroidism   . Memory difficulty 09/22/2015  . Osteoarthritis   . Transient alteration of awareness 03/30/2015  . Vertigo    hx of     Assessment: 84yo male c/o fever, EMS found pt to be in Afib, no known h/o Afib, in ED HR was 100-140s, to begin heparin.  Heparin level slightly therapeutic at 0.34, on 1050 units/hr. No infusion issues.   Goal of Therapy:  Heparin level 0.3-0.7 units/ml Monitor platelets by anticoagulation protocol: Yes   Plan:  Continue heparin infusion at 1050 units/hr  Monitor daily heparin level and CBC Monitor for  signs/symptoms of bleeding  [name] Hearing PharmD., BCPS Clinical Pharmacist 04/18/2017 10:28 PM

## 2017-04-18 NOTE — ED Notes (Signed)
Breakfast tray ordered 

## 2017-04-18 NOTE — ED Notes (Signed)
Pharmacy called this RN and heparin infusion changed to 1050units/hr.

## 2017-04-18 NOTE — Progress Notes (Signed)
Patient Demographics:    [name], is a 84 y.o. male, DOB - 1933/12/19, IRW:[id number]  Admit date - 04/17/2017   Admitting Physician [name], MD  Outpatient Primary MD for the patient is [name], MD  LOS - 1   Chief Complaint  Patient presents with  . Atrial Fibrillation  . Code Sepsis        Subjective:    [name] today has no fevers, no emesis,  No chest pain,  With cough/congestion   Assessment  & Plan :    Principal Problem:   Sepsis ([place]) Active Problems:   Essential hypertension   Hypothyroidism   Acute encephalopathy   Seizures ([place])   Memory difficulty   CAP (community acquired pneumonia)   Atrial fibrillation with RVR ([place])   1) sepsis in the setting of pneumonia and chronic leg wounds -influenza negative, continue Zithromax and Rocephin pending sputum and blood cultures,  2)afib-most likely triggered by #1 above, no prior history of A. fib, currently on IV heparin, may need Xarelto versus Eliquis, echocardiogram and TSH ordered  3)Sz DO-continue Keppra and Depakote  4)CKD III-avoid nephrotoxic agents maintain adequate hydration  5) immunology-patient is on chronic steroid therapy for polyarthritis, stress dose steroids ordered  6) multiple decubitus ulcers-patient goes to wound care clinic  7) acute encephalopathy/confusion-most likely secondary to #1 above, mostly resolved at this time  Code Status : full   Disposition Plan  : Home with home health  Consults  : Cardiology and wound care   DVT Prophylaxis  :    Heparin -    Lab Results  Component Value Date   PLT 151 04/18/2017    Inpatient Medications  Scheduled Meds: . aspirin EC  81 mg Oral QHS  . divalproex  250 mg Oral Daily  . divalproex  750 mg Oral QHS  . docusate sodium  200 mg Oral BID  . ezetimibe  5 mg Oral QHS  . gabapentin  300 mg Oral BID  . hydrocortisone sod  succinate (SOLU-CORTEF) inj  50 mg Intravenous Q8H  . levothyroxine  150 mcg Oral QAC breakfast  . pravastatin  40 mg Oral QHS  . zinc sulfate  220 mg Oral q morning - 10a   Continuous Infusions: . sodium chloride 125 mL/hr at 04/17/17 2356  . azithromycin    . cefTRIAXone (ROCEPHIN)  IV    . heparin 1,050 Units/hr (04/18/17 1315)  . vancomycin     PRN Meds:.acetaminophen **OR** acetaminophen, HYDROcodone-acetaminophen, nitroGLYCERIN, ondansetron **OR** ondansetron (ZOFRAN) IV    Anti-infectives (From admission, onward)   Start     Dose/Rate Route Frequency Ordered Stop   04/19/17 0800  vancomycin (VANCOCIN) 1,250 mg in sodium chloride 0.9 % 250 mL IVPB  Status:  Discontinued     1,250 mg 166.7 mL/hr over 90 Minutes Intravenous Every 24 hours 04/18/17 0738 04/18/17 0741   04/18/17 2200  azithromycin (ZITHROMAX) 500 mg in dextrose 5 % 250 mL IVPB     500 mg 250 mL/hr over 60 Minutes Intravenous Every 24 hours 04/17/17 2316 04/25/17 2159   04/18/17 2000  cefTRIAXone (ROCEPHIN) 1 g in dextrose 5 % 50 mL IVPB     1 g 100 mL/hr over 30 Minutes Intravenous  Every 24 hours 04/17/17 2316 04/25/17 1959   04/18/17 2000  vancomycin (VANCOCIN) IVPB 750 mg/150 ml premix     750 mg 150 mL/hr over 60 Minutes Intravenous Every 12 hours 04/18/17 0741     04/18/17 0800  vancomycin (VANCOCIN) 1,750 mg in sodium chloride 0.9 % 500 mL IVPB  Status:  Discontinued     1,750 mg 250 mL/hr over 120 Minutes Intravenous  Once 04/18/17 0738 04/18/17 0741   04/18/17 0800  vancomycin (VANCOCIN) 1,750 mg in sodium chloride 0.9 % 500 mL IVPB     1,750 mg 250 mL/hr over 120 Minutes Intravenous  Once 04/18/17 0741 04/18/17 1033   04/17/17 2130  cefTRIAXone (ROCEPHIN) 1 g in dextrose 5 % 50 mL IVPB     1 g 100 mL/hr over 30 Minutes Intravenous  Once 04/17/17 2127 04/17/17 2215   04/17/17 2130  azithromycin (ZITHROMAX) 500 mg in dextrose 5 % 250 mL IVPB     500 mg 250 mL/hr over 60 Minutes Intravenous  Once  04/17/17 2127 04/17/17 2245        Objective:   Vitals:   04/18/17 1515 04/18/17 1536 04/18/17 1811 04/18/17 1936  BP: 102/70  138/89 (!) 149/88  Pulse: 87  97 83  Resp: (!) 22   18  Temp:  98.9 F (37.2 C) 97.8 F (36.6 C) (!) 97.4 F (36.3 C)  TempSrc:  Oral Oral Oral  SpO2: 97%  98% 96%  Weight:      Height:        Wt Readings from Last 3 Encounters:  04/17/17 90.7 kg (200 lb)  09/22/15 88.5 kg (195 lb)  07/20/15 88.2 kg (194 lb 8 oz)     Intake/Output Summary (Last 24 hours) at 04/18/2017 2003 Last data filed at 04/18/2017 1033 Gross per 24 hour  Intake 5195.83 ml  Output -  Net 5195.83 ml     Physical Exam  Gen:- Awake Alert,  In no apparent distress HEENT:- [place].AT, No sclera icterus Neck-Supple Neck,No JVD,.  Lungs-  CTAB  CV- S1, S2 normal Abd-  +ve B.Sounds, Abd Soft, No tenderness,    Extremity/Skin:-Chronic upper and lower extremity ulcers please see documentation from wound care RN Psych-affect is appropriate Neuro-generalized weakness without new focal deficits    Data Review:   Micro Results Recent Results (from the past 240 hour(s))  Blood Culture (routine x 2)     Status: None (Preliminary result)   Collection Time: 04/17/17  7:54 PM  Result Value Ref Range Status   Specimen Description BLOOD LEFT ANTECUBITAL  Final   Special Requests   Final    BOTTLES DRAWN AEROBIC AND ANAEROBIC Blood Culture adequate volume   Culture   Final    NO GROWTH < 24 HOURS Performed at [facility], [address], [place], [place] [address]    Report Status PENDING  Incomplete  Blood Culture (routine x 2)     Status: None (Preliminary result)   Collection Time: 04/17/17  8:07 PM  Result Value Ref Range Status   Specimen Description BLOOD RIGHT ARM  Final   Special Requests   Final    BOTTLES DRAWN AEROBIC AND ANAEROBIC Blood Culture adequate volume   Culture   Final    NO GROWTH < 24 HOURS Performed at [facility], [name] [address],  [place], [place] [address]    Report Status PENDING  Incomplete    Radiology Reports Dg Tibia/fibula Right  Result Date: 04/18/2017  CLINICAL DATA:  Cellulitis EXAM: RIGHT TIBIA AND FIBULA - 2 VIEW COMPARISON:  Right ankle radiographs March 30, 2017 FINDINGS: Frontal and lateral views were obtained. 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 No acute fracture or dislocation. No erosive change or bony destruction evident. There is extensive arterial vascular calcification as well as soft tissue calcification which may be indicative of chronic stasis with likely cellulitis. There are spurs arising from posterior inferior calcaneus. There are plantar fascia calcifications. A bandage overlies the distal tibia and fibula. IMPRESSION: No fracture or dislocation. No erosive change or bony destruction. 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 Soft tissue calcifications likely due to chronic stasis and cellulitis. Extensive arterial vascular calcification also noted in the trifurcation and popliteal arteries. There is a plantar fascia calcification as well as calcaneal spurs. Electronically Signed   By: Lowella Grip III M.D.   On: 04/18/2017 07:55   Dg Ankle 2 Views Right  Result Date: 04/10/2017 CLINICAL DATA:  Nonhealing ulcer of the medial malleolus. EXAM: RIGHT ANKLE - 2 VIEW COMPARISON:  None. FINDINGS: There is no evidence of fracture, dislocation, or joint effusion. There is no aggressive osseous lesion. There is a plantar calcaneal spur. 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 There is no periosteal reaction or bone destruction. There is a soft tissue ulcer overlying the medial malleolus. There is peripheral vascular atherosclerotic disease. There is partial ossification of the plantar fascia adjacent to the calcaneal insertion. IMPRESSION: No evidence of osteomyelitis of the right ankle. Electronically  Signed   By: Kathreen Devoid   On: 04/10/2017 08:32   Ct Head Wo Contrast  Result Date: 04/17/2017 CLINICAL DATA:  Acute onset of altered mental status. Atrial fibrillation. EXAM: CT HEAD WITHOUT CONTRAST TECHNIQUE: Contiguous axial images were obtained from the base of the skull through the vertex without intravenous contrast. COMPARISON:  CT of the head performed 07/06/2015 FINDINGS: Brain: No evidence of acute infarction, hemorrhage, hydrocephalus, extra-axial collection or mass lesion / mass effect. Prominence of the ventricles and sulci reflects mild cortical volume loss. Scattered periventricular white matter change likely reflects small vessel ischemic microangiopathy. The brainstem and fourth ventricle are within normal limits. The basal ganglia are unremarkable in appearance. The cerebral hemispheres demonstrate grossly normal gray-white differentiation. No mass effect or midline shift is seen. Vascular: No hyperdense vessel or unexpected calcification. Skull: There is no evidence of fracture; visualized osseous structures are unremarkable in appearance. Sinuses/Orbits: The orbits are within normal limits. The paranasal sinuses and mastoid air cells are well-aerated. Other: No significant soft tissue abnormalities are seen. IMPRESSION: 1. No acute intracranial pathology seen on CT. 2. Mild cortical volume loss and scattered small vessel ischemic microangiopathy. Electronically Signed   By: Garald Balding M.D.   On: 04/17/2017 21:35   Dg Chest Portable 1 View  Result Date: 04/17/2017 CLINICAL DATA:  Acute onset of fever.  Atrial fibrillation. EXAM: PORTABLE CHEST 1 VIEW COMPARISON:  Chest radiograph performed 01/31/2015, and CT of the chest performed 07/06/2015 FINDINGS: The lungs are well-aerated. Left basilar airspace opacity could reflect pneumonia. There is no evidence of pleural effusion or pneumothorax. The cardiomediastinal silhouette is mildly enlarged. No acute osseous abnormalities are seen.  IMPRESSION: 1. Left basilar airspace opacity could reflect pneumonia. 2. Mild cardiomegaly. Electronically Signed   By: Garald Balding M.D.   On: 04/17/2017 21:58   Dg Foot Complete Right  Result Date: 04/10/2017 CLINICAL DATA:  Ulcer of the right fourth  toe. EXAM: RIGHT FOOT COMPLETE - 3+ VIEW COMPARISON:  None. FINDINGS: There is no fracture or dislocation. There is no periosteal reaction or bone destruction. There is no osteolysis. There is mild osteoarthritis of the first MTP joint and first TMT joint. There is partial ossification of the plantar fascia. There is peripheral vascular atherosclerotic disease. There is no focal soft tissue abnormality. IMPRESSION: No evidence of osteomyelitis of the right foot. Electronically Signed   By: Kathreen Devoid   On: 04/10/2017 08:33     CBC Recent Labs  Lab 04/17/17 1954 04/18/17 0532  WBC 19.0* 24.6*  HGB 11.8* 10.6*  HCT 38.5* 34.7*  PLT 142* 151  MCV 101.6* 102.7*  MCH 31.1 31.4  MCHC 30.6 30.5  RDW 14.9 15.3  LYMPHSABS 1.1 0.8  MONOABS 1.3* 1.1*  EOSABS 0.0 0.0  BASOSABS 0.0 0.0    Chemistries  Recent Labs  Lab 04/17/17 1954 04/18/17 0103 04/18/17 0532  NA 146*  --  143  K 3.7  --  3.9  CL 108  --  110  CO2 24  --  18*  GLUCOSE 111*  --  105*  BUN 28*  --  27*  CREATININE 1.50*  --  1.46*  CALCIUM 8.5*  --  7.4*  MG  --  1.6*  --   AST 103*  --  77*  ALT 75*  --  70*  ALKPHOS 75  --  66  BILITOT 1.2  --  1.1   ------------------------------------------------------------------------------------------------------------------ No results for input(s): CHOL, HDL, LDLCALC, TRIG, CHOLHDL, LDLDIRECT in the last 72 hours.  Lab Results  Component Value Date   HGBA1C 5.8 (H) 07/06/2015   ------------------------------------------------------------------------------------------------------------------ Recent Labs    04/17/17 1954  TSH 1.782    ------------------------------------------------------------------------------------------------------------------ Recent Labs    04/18/17 0810  VITAMINB12 884  FOLATE 36.6  FERRITIN 202  TIBC 228*  IRON 12*  RETICCTPCT 2.7    Coagulation profile Recent Labs  Lab 04/18/17 0103  INR 1.34    No results for input(s): DDIMER in the last 72 hours.  Cardiac Enzymes Recent Labs  Lab 04/18/17 0103 04/18/17 0532 04/18/17 1114  TROPONINI 0.14* 0.35* 0.49*   ------------------------------------------------------------------------------------------------------------------ No results found for: BNP   Mendi Constable M.D on 04/18/2017 at 8:03 PM  Between 7am to 7pm - Pager - [phone]  After 7pm go to [email or web address] - password [email or web address]  [facility] -  Office  [phone]   Voice Recognition Viviann Spare dictation system was used to create this note, attempts have been made to correct errors. Please contact the author with questions and/or clarifications.

## 2017-04-18 NOTE — Consult Note (Addendum)
[facility] Nurse wound consult note Reason for Consult: Pt has multiple wounds and is followed by the outpatient wound center for chronic, nonhealing wounds, according to the patient's wife at the bedside.  A biopsy has been performed and patient receives serial debridement to left inner calf with minimal improvement.  Recently, she states that silver nitrate was applied since the wound would not stop bleeding and wound is dark black after this procedure. Wound type: Left inner calf with chronic full thickness wound; black moist wound bed, large amt yellow drainage, no odor, 2X1X.4cm Right inner ankle with chronic full thickness wound, bone palpable, .3X.3cm, 100% yellow dry wound bed Right outer calf with chronic full thickness wound; red dry woundbed, .3X.3cm, no odor. Left great toe with 3 areas of full thickness wounds; red and dry; .3X.3X.1cm/.2X.2X.1cm/.2X.2X.2cm, no odor or drainage Left 4th toe near nailbed with full thickness wound; .2X.2X.1cm, red and dry, no odor or drainage Right great toe near previous nailbed (no longer present) with full thickness wound; .2X.2X.1cm, red and dry, no odor or drainage Right 4th toe near nailbed with full thickness wound; .2X.2X.1cm, red and dry, no odor or drainage Left arm partial thickness skin tear; 2X2X.1cm, skin approximated over 50% of woundbed, 50% red and small amt bloody drainage; attempted to approximate skin over wound with steristrips and applied foam dressing to protect and promote healing. Dressing procedure/placement/frequency: Steristrips and silicone foam dressing to left arm skin tear to assist with healing and and avoid adherence to skin upon removal. Continue present plan of care with Aquacel to right inner calf to provide antimicrobial benefits and absorb drainage. Continue present plan of care with collagen dressings to all other wounds on bilat feet and legs.  Discussed with patient and wife that this topical treatment is NOT available in the  [facility].  Wife has supplies at the bedside and will provide them to the bedside nurses when patient is transferred to the floor after the ER and topical treatment orders have been provided for nurses to perform daily. Pt can follow up with the outpatient wound care center after discharge. Please re-consult if further assistance is needed.  Thank-you,  Julien Girt MSN, [place], [place], [place], [place]

## 2017-04-18 NOTE — ED Notes (Signed)
Date and time results received: 04/18/17 0344 (use smartphrase ".now" to insert current time)  Test: troponin Critical Value: 0.14  Name of Provider Notified: Hal Hope MD  Orders Received? Or Actions Taken?: awaiting new orders

## 2017-04-18 NOTE — Progress Notes (Signed)
ANTICOAGULATION CONSULT NOTE - Initial Consult  Pharmacy Consult for heparin Indication: atrial fibrillation  Allergies  Allergen Reactions  . Demerol [Meperidine] Nausea And Vomiting  . Keppra [Levetiracetam] Other (See Comments)    Causes sleepiness    Patient Measurements: Weight: 200 lb (90.7 kg)  Vital Signs: Temp: 101.5 F (38.6 C) (02/06 2006) Temp Source: Rectal (02/06 2006) BP: 108/70 (02/07 0100) Pulse Rate: 96 (02/07 0100)  Labs: Recent Labs    04/17/17 1954  HGB 11.8*  HCT 38.5*  PLT 142*  CREATININE 1.50*    Medical History: Past Medical History:  Diagnosis Date  . Abnormality of gait 09/22/2015  . Arthritis   . Bruises easily   . Cancer ([place])    skin CA removed from back  . Chronic insomnia 03/30/2015  . Complication of anesthesia    trouble waking up  . Constipation    from medications taken  . GERD (gastroesophageal reflux disease)   . Hypercholesteremia   . Hypertension   . Hypothyroidism   . Memory difficulty 09/22/2015  . Osteoarthritis   . Transient alteration of awareness 03/30/2015  . Vertigo    hx of     Assessment: 84yo male c/o fever, EMS found pt to be in Afib, no known h/o Afib, in ED HR was 100-140s, to begin heparin.  Goal of Therapy:  Heparin level 0.3-0.7 units/ml Monitor platelets by anticoagulation protocol: Yes   Plan:  Will give heparin 4000 units IV bolus x1 followed by gtt at 1200 units/hr and monitor heparin levels and CBC.  Wynona Neat, PharmD, BCPS  04/18/2017,1:47 AM

## 2017-04-19 ENCOUNTER — Inpatient Hospital Stay (HOSPITAL_COMMUNITY): Payer: Medicare Other

## 2017-04-19 LAB — CBC
HCT: 33.7 % — ABNORMAL LOW (ref 39.0–52.0)
Hemoglobin: 10.4 g/dL — ABNORMAL LOW (ref 13.0–17.0)
MCH: 31.5 pg (ref 26.0–34.0)
MCHC: 30.9 g/dL (ref 30.0–36.0)
MCV: 102.1 fL — ABNORMAL HIGH (ref 78.0–100.0)
Platelets: 162 10*3/uL (ref 150–400)
RBC: 3.3 MIL/uL — ABNORMAL LOW (ref 4.22–5.81)
RDW: 15.3 % (ref 11.5–15.5)
WBC: 19.2 10*3/uL — ABNORMAL HIGH (ref 4.0–10.5)

## 2017-04-19 LAB — HEPARIN LEVEL (UNFRACTIONATED): Heparin Unfractionated: 0.37 IU/mL (ref 0.30–0.70)

## 2017-04-19 LAB — URINE CULTURE: Culture: NO GROWTH

## 2017-04-19 LAB — BLOOD GAS, ARTERIAL
Acid-base deficit: 6.7 mmol/L — ABNORMAL HIGH (ref 0.0–2.0)
Bicarbonate: 17.9 mmol/L — ABNORMAL LOW (ref 20.0–28.0)
Drawn by: 52078
O2 Content: 2 L/min
O2 Saturation: 96.1 %
Patient temperature: 98.6
pCO2 arterial: 33.3 mmHg (ref 32.0–48.0)
pH, Arterial: 7.348 — ABNORMAL LOW (ref 7.350–7.450)
pO2, Arterial: 91.2 mmHg (ref 83.0–108.0)

## 2017-04-19 MED ORDER — QUETIAPINE FUMARATE 25 MG PO TABS
25.0000 mg | ORAL_TABLET | Freq: Once | ORAL | Status: AC
  Administered 2017-04-19: 25 mg via ORAL
  Filled 2017-04-19: qty 1

## 2017-04-19 MED ORDER — ALBUTEROL SULFATE (2.5 MG/3ML) 0.083% IN NEBU
2.5000 mg | INHALATION_SOLUTION | Freq: Four times a day (QID) | RESPIRATORY_TRACT | Status: AC | PRN
Start: 2017-04-19 — End: 2017-04-21
  Administered 2017-04-19 – 2017-04-21 (×2): 2.5 mg via RESPIRATORY_TRACT
  Filled 2017-04-19: qty 3

## 2017-04-19 MED ORDER — LORAZEPAM 2 MG/ML IJ SOLN
0.5000 mg | Freq: Once | INTRAMUSCULAR | Status: AC
  Administered 2017-04-19: 0.5 mg via INTRAVENOUS
  Filled 2017-04-19: qty 1

## 2017-04-19 MED ORDER — HALOPERIDOL LACTATE 5 MG/ML IJ SOLN
1.0000 mg | Freq: Once | INTRAMUSCULAR | Status: AC
  Administered 2017-04-19: 1 mg via INTRAVENOUS
  Filled 2017-04-19: qty 1

## 2017-04-19 NOTE — Progress Notes (Signed)
ANTICOAGULATION CONSULT NOTE - Follow Up Consult  Pharmacy Consult for Heparin Indication: atrial fibrillation  Allergies  Allergen Reactions  . Demerol [Meperidine] Nausea And Vomiting  . Keppra [Levetiracetam] Other (See Comments)    Causes sleepiness    Patient Measurements: Height: 5\' 10"  (177.8 cm) Weight: 200 lb (90.7 kg) IBW/kg (Calculated) : 73 Heparin Dosing Weight: 90 kg  Vital Signs: Temp: 97.1 F (36.2 C) (02/08 0730) Temp Source: Oral (02/08 0730) BP: 155/109 (02/08 0730) Pulse Rate: 115 (02/08 0730)  Labs: Recent Labs    04/17/17 1954 04/18/17 0103 04/18/17 0532 04/18/17 1000 04/18/17 1114 04/18/17 2121 04/19/17 0313  HGB 11.8*  --  10.6*  --   --   --  10.4*  HCT 38.5*  --  34.7*  --   --   --  33.7*  PLT 142*  --  151  --   --   --  162  APTT  --  40*  --   --   --   --   --   LABPROT  --  16.4*  --   --   --   --   --   INR  --  1.34  --   --   --   --   --   HEPARINUNFRC  --   --   --  0.71*  --  0.34 0.37  CREATININE 1.50*  --  1.46*  --   --   --   --   TROPONINI  --  0.14* 0.35*  --  0.49*  --   --     Estimated Creatinine Clearance: 45 mL/min (A) (by C-G formula based on SCr of 1.46 mg/dL (H)).  Assessment:  84 yr old male continues on IV heparin for atrial fibrillation.  Heparin level remains therapeutic (0.37) on 1050 units/hr.  Goal of Therapy:  Heparin level 0.3-0.7 units/ml Monitor platelets by anticoagulation protocol: Yes   Plan:   Continue heparin drip at 1050 units/hr  Daily heparin level and CBC while on heparin.  Follow up anticoagulation plans.  Arty Baumgartner, [place] Pager: [phone] or [phone] 04/19/2017,10:41 AM

## 2017-04-19 NOTE — Progress Notes (Signed)
Notified MD of expiratory wheezing.

## 2017-04-19 NOTE — Progress Notes (Signed)
Patient Demographics:    [name], is a 84 y.o. male, DOB - [birth year], GGY:[id number]  Admit date - 04/17/2017   Admitting Physician [name], MD  Outpatient Primary MD for the patient is [name], MD  LOS - 2   Chief Complaint  Patient presents with  . Atrial Fibrillation  . Code Sepsis        Subjective:    [name] today has no fevers, no emesis,  No chest pain,  Less restless  Assessment  & Plan :    Principal Problem:   Sepsis ([place]) Active Problems:   Essential hypertension   Hypothyroidism   Acute encephalopathy   Seizures ([place])   Memory difficulty   CAP (community acquired pneumonia)   Atrial fibrillation with RVR ([facility])   1)Sepsis in the setting of pneumonia and chronic leg wounds -influenza negative, continue Zithromax and Rocephin pending sputum and blood cultures, WBC down to 19k from 24 k  2)Afib-most likely triggered by #1 above, no prior history of A. fib, currently on IV heparin, may need Xarelto versus Eliquis, echocardiogram with EF of 60 to 65 % without regional wall motion abnormalities and TSH 1.7  3)Sz DO-continue Keppra and Depakote  4)CKD III-avoid nephrotoxic agents maintain adequate hydration  5)Immunology-patient is on chronic steroid therapy for polyarthritis, stress dose steroids ordered  6)Multiple decubitus ulcers-continue dressings to lower extremity ulcers, patient goes to wound care clinic  7) acute encephalopathy/confusion-most likely secondary to #1 above, mostly resolved at this time  Code Status : full   Disposition Plan  : Home with home health  Consults  : Cardiology and wound care   DVT Prophylaxis  :    Heparin -    Lab Results  Component Value Date   PLT 162 04/19/2017    Inpatient Medications  Scheduled Meds: . aspirin EC  81 mg Oral QHS  . divalproex  250 mg Oral Daily  . divalproex  750 mg Oral QHS    . docusate sodium  200 mg Oral BID  . ezetimibe  5 mg Oral QHS  . gabapentin  300 mg Oral BID  . hydrocortisone sod succinate (SOLU-CORTEF) inj  50 mg Intravenous Q8H  . levothyroxine  150 mcg Oral QAC breakfast  . pravastatin  40 mg Oral QHS  . zinc sulfate  220 mg Oral q morning - 10a   Continuous Infusions: . azithromycin Stopped (04/18/17 2234)  . cefTRIAXone (ROCEPHIN)  IV Stopped (04/18/17 2127)  . heparin 1,050 Units/hr (04/18/17 2003)  . vancomycin Stopped (04/19/17 1108)   PRN Meds:.acetaminophen **OR** acetaminophen, albuterol, HYDROcodone-acetaminophen, nitroGLYCERIN, ondansetron **OR** ondansetron (ZOFRAN) IV    Anti-infectives (From admission, onward)   Start     Dose/Rate Route Frequency Ordered Stop   04/19/17 0800  vancomycin (VANCOCIN) 1,250 mg in sodium chloride 0.9 % 250 mL IVPB  Status:  Discontinued     1,250 mg 166.7 mL/hr over 90 Minutes Intravenous Every 24 hours 04/18/17 0738 04/18/17 0741   04/18/17 2200  azithromycin (ZITHROMAX) 500 mg in dextrose 5 % 250 mL IVPB     500 mg 250 mL/hr over 60 Minutes Intravenous Every 24 hours 04/17/17 2316 04/25/17 2159   04/18/17 2000  cefTRIAXone (ROCEPHIN) 1 g in dextrose  5 % 50 mL IVPB     1 g 100 mL/hr over 30 Minutes Intravenous Every 24 hours 04/17/17 2316 04/25/17 2059   04/18/17 2000  vancomycin (VANCOCIN) IVPB 750 mg/150 ml premix     750 mg 150 mL/hr over 60 Minutes Intravenous Every 12 hours 04/18/17 0741     04/18/17 0800  vancomycin (VANCOCIN) 1,750 mg in sodium chloride 0.9 % 500 mL IVPB  Status:  Discontinued     1,750 mg 250 mL/hr over 120 Minutes Intravenous  Once 04/18/17 0738 04/18/17 0741   04/18/17 0800  vancomycin (VANCOCIN) 1,750 mg in sodium chloride 0.9 % 500 mL IVPB     1,750 mg 250 mL/hr over 120 Minutes Intravenous  Once 04/18/17 0741 04/18/17 1033   04/17/17 2130  cefTRIAXone (ROCEPHIN) 1 g in dextrose 5 % 50 mL IVPB     1 g 100 mL/hr over 30 Minutes Intravenous  Once 04/17/17 2127  04/17/17 2215   04/17/17 2130  azithromycin (ZITHROMAX) 500 mg in dextrose 5 % 250 mL IVPB     500 mg 250 mL/hr over 60 Minutes Intravenous  Once 04/17/17 2127 04/17/17 2245        Objective:   Vitals:   04/19/17 0730 04/19/17 1200 04/19/17 1339 04/19/17 1600  BP: (!) 155/109  (!) 181/96   Pulse: (!) 115 85 (!) 112 (!) 113  Resp: (!) 23 15 (!) 23 (!) 27  Temp: (!) 97.1 F (36.2 C)  (!) 97.5 F (36.4 C)   TempSrc: Oral  Oral Oral  SpO2: 98% 100% 98% 96%  Weight:      Height:        Wt Readings from Last 3 Encounters:  04/17/17 90.7 kg (200 lb)  09/22/15 88.5 kg (195 lb)  07/20/15 88.2 kg (194 lb 8 oz)     Intake/Output Summary (Last 24 hours) at 04/19/2017 1905 Last data filed at 04/19/2017 1637 Gross per 24 hour  Intake 1217.69 ml  Output 875 ml  Net 342.69 ml     Physical Exam  Gen:- Awake Alert,  In no apparent distress HEENT:- [place].AT, No sclera icterus Neck-Supple Neck,No JVD,.  Lungs-  CTAB  CV- S1, S2 normal Abd-  +ve B.Sounds, Abd Soft, No tenderness,    Extremity/Skin:-Chronic upper and lower extremity ulcers please see documentation from wound care RN Psych-affect is appropriate Neuro-generalized weakness without new focal deficits    Data Review:   Micro Results Recent Results (from the past 240 hour(s))  Blood Culture (routine x 2)     Status: None (Preliminary result)   Collection Time: 04/17/17  7:54 PM  Result Value Ref Range Status   Specimen Description BLOOD LEFT ANTECUBITAL  Final   Special Requests   Final    BOTTLES DRAWN AEROBIC AND ANAEROBIC Blood Culture adequate volume   Culture   Final    NO GROWTH 2 DAYS Performed at [facility] [address], [place], [place] [address]    Report Status PENDING  Incomplete  Blood Culture (routine x 2)     Status: None (Preliminary result)   Collection Time: 04/17/17  8:07 PM  Result Value Ref Range Status   Specimen Description BLOOD RIGHT ARM  Final   Special Requests   Final     BOTTLES DRAWN AEROBIC AND ANAEROBIC Blood Culture adequate volume   Culture   Final    NO GROWTH 2 DAYS Performed at [facility], [place] [address] [place],  [address]  Report Status PENDING  Incomplete  Urine culture     Status: None   Collection Time: 04/17/17  8:14 PM  Result Value Ref Range Status   Specimen Description URINE, CATHETERIZED  Final   Special Requests NONE  Final   Culture   Final    NO GROWTH Performed at [facility], [address] [place], [place] [address]    Report Status 04/19/2017 FINAL  Final    Radiology Reports Dg Tibia/fibula Right  Result Date: 04/18/2017 CLINICAL DATA:  Cellulitis EXAM: RIGHT TIBIA AND FIBULA - 2 VIEW COMPARISON:  Right ankle radiographs March 30, 2017 FINDINGS: Frontal and lateral views were obtained. 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 No acute fracture or dislocation. No erosive change or bony destruction evident. There is extensive arterial vascular calcification as well as soft tissue calcification which may be indicative of chronic stasis with likely cellulitis. There are spurs arising from posterior inferior calcaneus. There are plantar fascia calcifications. A bandage overlies the distal tibia and fibula. IMPRESSION: No fracture or dislocation. No erosive change or bony destruction. 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 Soft tissue calcifications likely due to chronic stasis and cellulitis. Extensive arterial vascular calcification also noted in the trifurcation and popliteal arteries. There is a plantar fascia calcification as well as calcaneal spurs. Electronically Signed   By: Lowella Grip III M.D.   On: 04/18/2017 07:55   Dg Ankle 2 Views Right  Result Date: 04/10/2017 CLINICAL DATA:  Nonhealing ulcer of the medial malleolus. EXAM: RIGHT ANKLE - 2 VIEW COMPARISON:  None. FINDINGS: There is no evidence of fracture, dislocation, or joint  effusion. There is no aggressive osseous lesion. There is a plantar calcaneal spur. 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 There is no periosteal reaction or bone destruction. There is a soft tissue ulcer overlying the medial malleolus. There is peripheral vascular atherosclerotic disease. There is partial ossification of the plantar fascia adjacent to the calcaneal insertion. IMPRESSION: No evidence of osteomyelitis of the right ankle. Electronically Signed   By: Kathreen Devoid   On: 04/10/2017 08:32   Ct Head Wo Contrast  Result Date: 04/17/2017 CLINICAL DATA:  Acute onset of altered mental status. Atrial fibrillation. EXAM: CT HEAD WITHOUT CONTRAST TECHNIQUE: Contiguous axial images were obtained from the base of the skull through the vertex without intravenous contrast. COMPARISON:  CT of the head performed 07/06/2015 FINDINGS: Brain: No evidence of acute infarction, hemorrhage, hydrocephalus, extra-axial collection or mass lesion / mass effect. Prominence of the ventricles and sulci reflects mild cortical volume loss. Scattered periventricular white matter change likely reflects small vessel ischemic microangiopathy. The brainstem and fourth ventricle are within normal limits. The basal ganglia are unremarkable in appearance. The cerebral hemispheres demonstrate grossly normal gray-white differentiation. No mass effect or midline shift is seen. Vascular: No hyperdense vessel or unexpected calcification. Skull: There is no evidence of fracture; visualized osseous structures are unremarkable in appearance. Sinuses/Orbits: The orbits are within normal limits. The paranasal sinuses and mastoid air cells are well-aerated. Other: No significant soft tissue abnormalities are seen. IMPRESSION: 1. No acute intracranial pathology seen on CT. 2. Mild cortical volume loss and scattered small vessel ischemic microangiopathy. Electronically Signed   By: Garald Balding M.D.    On: 04/17/2017 21:35   Dg Chest Port 1 View  Result Date: 04/19/2017 CLINICAL DATA:  84 year old male with shortness of breath, agitation. EXAM: PORTABLE CHEST 1 VIEW COMPARISON:  04/17/2017 and earlier. FINDINGS: Portable AP semi upright view at 0642 hrs. Increased veiling opacity at both lung bases. Increased streaky opacity about the right hilum. Portion of the diaphragm is now obscured. Stable cardiac size and mediastinal contours. No pneumothorax. Pulmonary vascularity appears stable without overt edema. Chronic appearing right lateral 8th rib fracture. Negative visible bowel gas pattern. Extensive postoperative changes to the cervical spine. IMPRESSION: Increased bibasilar pulmonary opacity greater on the right is nonspecific. Favor new or increased pleural effusions since 04/17/2017 with atelectasis. Electronically Signed   By: Genevie Ann M.D.   On: 04/19/2017 07:39   Dg Chest Portable 1 View  Result Date: 04/17/2017 CLINICAL DATA:  Acute onset of fever.  Atrial fibrillation. EXAM: PORTABLE CHEST 1 VIEW COMPARISON:  Chest radiograph performed 01/31/2015, and CT of the chest performed 07/06/2015 FINDINGS: The lungs are well-aerated. Left basilar airspace opacity could reflect pneumonia. There is no evidence of pleural effusion or pneumothorax. The cardiomediastinal silhouette is mildly enlarged. No acute osseous abnormalities are seen. IMPRESSION: 1. Left basilar airspace opacity could reflect pneumonia. 2. Mild cardiomegaly. Electronically Signed   By: Garald Balding M.D.   On: 04/17/2017 21:58   Dg Foot Complete Right  Result Date: 04/10/2017 CLINICAL DATA:  Ulcer of the right fourth toe. EXAM: RIGHT FOOT COMPLETE - 3+ VIEW COMPARISON:  None. FINDINGS: There is no fracture or dislocation. There is no periosteal reaction or bone destruction. There is no osteolysis. There is mild osteoarthritis of the first MTP joint and first TMT joint. There is partial ossification of the plantar fascia. There is  peripheral vascular atherosclerotic disease. There is no focal soft tissue abnormality. IMPRESSION: No evidence of osteomyelitis of the right foot. Electronically Signed   By: Kathreen Devoid   On: 04/10/2017 08:33     CBC Recent Labs  Lab 04/17/17 1954 04/18/17 0532 04/19/17 0313  WBC 19.0* 24.6* 19.2*  HGB 11.8* 10.6* 10.4*  HCT 38.5* 34.7* 33.7*  PLT 142* 151 162  MCV 101.6* 102.7* 102.1*  MCH 31.1 31.4 31.5  MCHC 30.6 30.5 30.9  RDW 14.9 15.3 15.3  LYMPHSABS 1.1 0.8  --   MONOABS 1.3* 1.1*  --   EOSABS 0.0 0.0  --   BASOSABS 0.0 0.0  --     Chemistries  Recent Labs  Lab 04/17/17 1954 04/18/17 0103 04/18/17 0532  NA 146*  --  143  K 3.7  --  3.9  CL 108  --  110  CO2 24  --  18*  GLUCOSE 111*  --  105*  BUN 28*  --  27*  CREATININE 1.50*  --  1.46*  CALCIUM 8.5*  --  7.4*  MG  --  1.6*  --   AST 103*  --  77*  ALT 75*  --  70*  ALKPHOS 75  --  66  BILITOT 1.2  --  1.1   ------------------------------------------------------------------------------------------------------------------ No results for input(s): CHOL, HDL, LDLCALC, TRIG, CHOLHDL, LDLDIRECT in the last 72 hours.  Lab Results  Component Value Date   HGBA1C 5.8 (H) 07/06/2015   ------------------------------------------------------------------------------------------------------------------ Recent Labs    04/17/17 1954  TSH 1.782   ------------------------------------------------------------------------------------------------------------------ Recent Labs    04/18/17 0810  VITAMINB12 884  FOLATE 36.6  FERRITIN 202  TIBC 228*  IRON 12*  RETICCTPCT 2.7    Coagulation profile Recent Labs  Lab 04/18/17 0103  INR 1.34    No results for input(s): DDIMER in the last 72 hours.  Cardiac Enzymes Recent Labs  Lab 04/18/17 0103 04/18/17 0532 04/18/17 1114  TROPONINI 0.14* 0.35* 0.49*    ------------------------------------------------------------------------------------------------------------------ No results found for: BNP   Haden Suder M.D on 04/19/2017 at 7:05 PM  Between 7am to 7pm - Pager - [phone]  After 7pm go to [email or web address] - password [email or web address]  [facility] -  Office  [phone]   Voice Recognition Viviann Spare dictation system was used to create this note, attempts have been made to correct errors. Please contact the author with questions and/or clarifications.

## 2017-04-19 NOTE — Progress Notes (Addendum)
Notified MD of increased agitation, confusion, HTN, increased WOB.  MD and rapid response nurse to bedside.  Blood gas drawn and cxr ordered.  Mitts applied for patient pulling at tubes and lines, and placed on 1:1 for safety.

## 2017-04-19 NOTE — Progress Notes (Addendum)
Patient is restless, agitated, and confused.   Combative towards wife and threatening to send nurse to jail for "taking his meds".  MD notified of agitation and restlessness.  Also made MD aware of wife wanting hydrocodone scheduled q6.

## 2017-04-19 NOTE — Progress Notes (Signed)
RN called for pt with increase agitation, confusion, wheezing, increased WOB and tachycardic. [place] NP paged PTA and at bedside.  Pt extremely agitated and confused, skin warm and dry, respiratory rate 30's using some accessory muscles with breathing, HR 110-120's, BP 169/99. ABG 7.34/33.3/91.2/17.9 PCXR ordered. Pt relaxed for approx 10 mins and dosed off to sleep, BP 135/89, HR 80-100, RR 25, 100 4L [place].  RN Mindy will sit with patient for safety precautions, advised to call for any needs.

## 2017-04-19 NOTE — Progress Notes (Signed)
Redressed skin tear to left arm. Started new piv in left wrist.

## 2017-04-20 LAB — CBC
HCT: 31.1 % — ABNORMAL LOW (ref 39.0–52.0)
Hemoglobin: 9.7 g/dL — ABNORMAL LOW (ref 13.0–17.0)
MCH: 31.5 pg (ref 26.0–34.0)
MCHC: 31.2 g/dL (ref 30.0–36.0)
MCV: 101 fL — ABNORMAL HIGH (ref 78.0–100.0)
Platelets: 159 10*3/uL (ref 150–400)
RBC: 3.08 MIL/uL — ABNORMAL LOW (ref 4.22–5.81)
RDW: 14.8 % (ref 11.5–15.5)
WBC: 15.3 10*3/uL — ABNORMAL HIGH (ref 4.0–10.5)

## 2017-04-20 LAB — HEPARIN LEVEL (UNFRACTIONATED)
Heparin Unfractionated: <0.1 IU/mL — ABNORMAL LOW (ref 0.30–0.70)
Heparin Unfractionated: 0.48 IU/mL (ref 0.30–0.70)

## 2017-04-20 MED ORDER — ALBUTEROL SULFATE (2.5 MG/3ML) 0.083% IN NEBU: 2.5000 mg | INHALATION_SOLUTION | Freq: Three times a day (TID) | RESPIRATORY_TRACT | Status: DC

## 2017-04-20 MED ORDER — HEPARIN BOLUS VIA INFUSION
2000.0000 [IU] | Freq: Once | INTRAVENOUS | Status: AC
  Administered 2017-04-20: 2000 [IU] via INTRAVENOUS
  Filled 2017-04-20: qty 2000

## 2017-04-20 MED ORDER — FUROSEMIDE 10 MG/ML IJ SOLN
20.0000 mg | Freq: Once | INTRAMUSCULAR | Status: AC
  Administered 2017-04-20: 20 mg via INTRAVENOUS
  Filled 2017-04-20: qty 2

## 2017-04-20 MED ORDER — ALBUTEROL SULFATE (2.5 MG/3ML) 0.083% IN NEBU
2.5000 mg | INHALATION_SOLUTION | Freq: Three times a day (TID) | RESPIRATORY_TRACT | Status: DC
  Administered 2017-04-20 – 2017-04-22 (×6): 2.5 mg via RESPIRATORY_TRACT
  Filled 2017-04-20 (×7): qty 3

## 2017-04-20 MED ORDER — GUAIFENESIN 100 MG/5ML PO SOLN
20.0000 mL | Freq: Four times a day (QID) | ORAL | Status: AC
  Administered 2017-04-20 – 2017-04-22 (×9): 400 mg via ORAL
  Filled 2017-04-20: qty 10
  Filled 2017-04-20: qty 20
  Filled 2017-04-20: qty 5
  Filled 2017-04-20 (×3): qty 20
  Filled 2017-04-20: qty 5
  Filled 2017-04-20 (×2): qty 20
  Filled 2017-04-20: qty 15

## 2017-04-20 MED ORDER — QUETIAPINE FUMARATE 25 MG PO TABS
25.0000 mg | ORAL_TABLET | Freq: Every evening | ORAL | Status: DC
  Administered 2017-04-20 – 2017-04-24 (×5): 25 mg via ORAL
  Filled 2017-04-20 (×5): qty 1

## 2017-04-20 NOTE — Progress Notes (Signed)
ANTICOAGULATION CONSULT NOTE - Follow Up Consult  Pharmacy Consult for Heparin Indication: atrial fibrillation  Allergies  Allergen Reactions  . Demerol [Meperidine] Nausea And Vomiting  . Keppra [Levetiracetam] Other (See Comments)    Causes sleepiness    Patient Measurements: Height: 5\' 10"  (177.8 cm) Weight: 190 lb 14.7 oz (86.6 kg) IBW/kg (Calculated) : 73 Heparin Dosing Weight: 87 kg  Vital Signs: Temp: 98.4 F (36.9 C) (02/09 1100) Temp Source: Oral (02/09 1100) BP: 154/88 (02/09 1100) Pulse Rate: 110 (02/09 1100)  Labs: Recent Labs    04/17/17 1954 04/18/17 0103 04/18/17 0532  04/18/17 1114  04/19/17 0313 04/20/17 0214 04/20/17 1243  HGB 11.8*  --  10.6*  --   --   --  10.4* 9.7*  --   HCT 38.5*  --  34.7*  --   --   --  33.7* 31.1*  --   PLT 142*  --  151  --   --   --  162 159  --   APTT  --  40*  --   --   --   --   --   --   --   LABPROT  --  16.4*  --   --   --   --   --   --   --   INR  --  1.34  --   --   --   --   --   --   --   HEPARINUNFRC  --   --   --    < >  --    < > 0.37 <0.10* 0.48  CREATININE 1.50*  --  1.46*  --   --   --   --   --   --   TROPONINI  --  0.14* 0.35*  --  0.49*  --   --   --   --    < > = values in this interval not displayed.    Estimated Creatinine Clearance: 41 mL/min (A) (by C-G formula based on SCr of 1.46 mg/dL (H)).  Assessment:  84 yr old male continues on IV heparin for atrial fibrillation.    Heparin level had been therapeutic, then undetectable today on 1050 units/hr. Rate increased and 2000 units IV bolus given.   Heparin level is now therapeutic (0.48) on 1200 units/hr.  Goal of Therapy:  Heparin level 0.3-0.7 units/ml Monitor platelets by anticoagulation protocol: Yes   Plan:   Continue heparin drip at 1200 units/hr  Daily heparin level and CBC while on heparin.  Follow up anticoagulation plans.  Arty Baumgartner, [place] Pager: [phone] or [phone] 04/20/2017,4:19 PM

## 2017-04-20 NOTE — Progress Notes (Signed)
ANTICOAGULATION CONSULT NOTE - Follow Up Consult  Pharmacy Consult for heparin Indication: atrial fibrillation  Labs: Recent Labs    04/17/17 1954 04/18/17 0103 04/18/17 0532  04/18/17 1114 04/18/17 2121 04/19/17 0313 04/20/17 0214  HGB 11.8*  --  10.6*  --   --   --  10.4* 9.7*  HCT 38.5*  --  34.7*  --   --   --  33.7* 31.1*  PLT 142*  --  151  --   --   --  162 159  APTT  --  40*  --   --   --   --   --   --   LABPROT  --  16.4*  --   --   --   --   --   --   INR  --  1.34  --   --   --   --   --   --   HEPARINUNFRC  --   --   --    < >  --  0.34 0.37 <0.10*  CREATININE 1.50*  --  1.46*  --   --   --   --   --   TROPONINI  --  0.14* 0.35*  --  0.49*  --   --   --    < > = values in this interval not displayed.    Assessment: 84yo male now undectable on heparin after two levels at lower end of goal; RN reports no gtt issues overnight.  Goal of Therapy:  Heparin level 0.3-0.7 units/ml   Plan:  Will give heparin 2000 units IV bolus then increase heparin gtt by 2 units/kg/hr to 1200 units/hr and check level in 8 hours.    Wynona Neat, PharmD, BCPS  04/20/2017,4:50 AM

## 2017-04-20 NOTE — Progress Notes (Signed)
Changed full linen, removed old bloody bandage from left arm skin tear. Removed infiltrated iv from left ac. Cleansed with soap and water and applied xeroform gauze to open area of skin tear and kerlix gauze wrap to left elbow and upper arm.

## 2017-04-20 NOTE — Progress Notes (Signed)
Patient Demographics:    [name], is a 84 y.o. male, DOB - [birth year], XKG:[id number]  Admit date - 04/17/2017   Admitting Physician [name], MD  Outpatient Primary MD for the patient is [name], MD  LOS - 3   Chief Complaint  Patient presents with  . Atrial Fibrillation  . Code Sepsis        Subjective:    [name] today has no fevers, no emesis,  No chest pain,  Son at bedside, cooperative  Assessment  & Plan :    Principal Problem:   Sepsis ([place]) Active Problems:   Essential hypertension   Hypothyroidism   Acute encephalopathy   Seizures ([place])   Memory difficulty   CAP (community acquired pneumonia)   Atrial fibrillation with RVR ([place])  Brief summary  84 y.o. male with history of chronic leg wounds (Rt > Lt), HTN, CKD, seizures, memory loss, hypothyroidism admitted on 04/17/17 with increased confusion leukocytosis and concerns about sepsis secondary to pneumonia plus/minus lower extremity cellulitis.  Patient was found to have a fibrillation with RVR on admission which is new for him   Plan:- 1)Sepsis in the setting of pneumonia and chronic leg wounds -influenza negative, continue Zithromax and Rocephin (started 04/17/17) , blood and urine cultures negative so far , WBC down to 15k from 24 k  2)Afib-most likely triggered by #1 above, no prior history of A. 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) at bedside, CHADs2Vasc score is 4 with annual stroke risk of 4.8 to 6.7%, however HASBLeD score is 2 with risk of major bleeding of 1.88 to 4.1%, at this time patient and son declined full anticoagulation for stroke prophylaxis at the time of discharge , for now they would like to continue IV heparin when patient is  here and upon discharge use just aspirin 81 mg daily  3)Sz DO-continue Keppra and Depakote  4)CKD III-stable, avoid nephrotoxic agents maintain adequate hydration  5)Immunology-patient is on chronic steroid therapy for polyarthritis, stress dose steroids ordered  6)Multiple decubitus ulcers-continue dressings to lower extremity ulcers, patient goes to wound care clinic  7) acute encephalopathy/confusion-most likely secondary to #1 above, mostly resolved at this time, continue Seroquel 25 mg nightly  8)Disposition Plan  : Home with home health, get PT eval  Code Status : full  Consults  : Cardiology and wound care   DVT Prophylaxis  :    Heparin -    Lab Results  Component Value Date   PLT 159 04/20/2017    Inpatient Medications  Scheduled Meds: . albuterol  2.5 mg Nebulization TID  . aspirin EC  81 mg Oral QHS  . divalproex  250 mg Oral Daily  . divalproex  750 mg Oral QHS  . docusate sodium  200 mg Oral BID  . ezetimibe  5 mg Oral QHS  . gabapentin  300 mg Oral BID  . guaiFENesin  20 mL Oral QID  . hydrocortisone sod succinate (SOLU-CORTEF) inj  50 mg Intravenous Q8H  . levothyroxine  150 mcg Oral QAC breakfast  . pravastatin  40  mg Oral QHS  . QUEtiapine  25 mg Oral QPM  . zinc sulfate  220 mg Oral q morning - 10a   Continuous Infusions: . azithromycin Stopped (04/19/17 2237)  . cefTRIAXone (ROCEPHIN)  IV Stopped (04/19/17 2130)  . heparin 1,200 Units/hr (04/20/17 0500)  . vancomycin 750 mg (04/20/17 1150)   PRN Meds:.acetaminophen **OR** acetaminophen, albuterol, HYDROcodone-acetaminophen, nitroGLYCERIN, ondansetron **OR** ondansetron (ZOFRAN) IV    Anti-infectives (From admission, onward)   Start     Dose/Rate Route Frequency Ordered Stop   04/19/17 0800  vancomycin (VANCOCIN) 1,250 mg in sodium chloride 0.9 % 250 mL IVPB  Status:  Discontinued     1,250 mg 166.7 mL/hr over 90 Minutes Intravenous Every 24 hours 04/18/17 0738 04/18/17 0741   04/18/17 2200   azithromycin (ZITHROMAX) 500 mg in dextrose 5 % 250 mL IVPB     500 mg 250 mL/hr over 60 Minutes Intravenous Every 24 hours 04/17/17 2316 04/25/17 2159   04/18/17 2000  cefTRIAXone (ROCEPHIN) 1 g in dextrose 5 % 50 mL IVPB     1 g 100 mL/hr over 30 Minutes Intravenous Every 24 hours 04/17/17 2316 04/25/17 2059   04/18/17 2000  vancomycin (VANCOCIN) IVPB 750 mg/150 ml premix     750 mg 150 mL/hr over 60 Minutes Intravenous Every 12 hours 04/18/17 0741     04/18/17 0800  vancomycin (VANCOCIN) 1,750 mg in sodium chloride 0.9 % 500 mL IVPB  Status:  Discontinued     1,750 mg 250 mL/hr over 120 Minutes Intravenous  Once 04/18/17 0738 04/18/17 0741   04/18/17 0800  vancomycin (VANCOCIN) 1,750 mg in sodium chloride 0.9 % 500 mL IVPB     1,750 mg 250 mL/hr over 120 Minutes Intravenous  Once 04/18/17 0741 04/18/17 1033   04/17/17 2130  cefTRIAXone (ROCEPHIN) 1 g in dextrose 5 % 50 mL IVPB     1 g 100 mL/hr over 30 Minutes Intravenous  Once 04/17/17 2127 04/17/17 2215   04/17/17 2130  azithromycin (ZITHROMAX) 500 mg in dextrose 5 % 250 mL IVPB     500 mg 250 mL/hr over 60 Minutes Intravenous  Once 04/17/17 2127 04/17/17 2245        Objective:   Vitals:   04/20/17 0006 04/20/17 0443 04/20/17 1100 04/20/17 1544  BP: (!) 158/84 (!) 154/80 (!) 154/88   Pulse: 66 (!) 112 (!) 110   Resp: (!) 25 (!) 29 (!) 28   Temp: 98 F (36.7 C) 98.5 F (36.9 C) 98.4 F (36.9 C)   TempSrc: Oral Axillary Oral   SpO2: 99% (!) 87% 97% 96%  Weight:  86.6 kg (190 lb 14.7 oz)    Height:        Wt Readings from Last 3 Encounters:  04/20/17 86.6 kg (190 lb 14.7 oz)  09/22/15 88.5 kg (195 lb)  07/20/15 88.2 kg (194 lb 8 oz)     Intake/Output Summary (Last 24 hours) at 04/20/2017 1853 Last data filed at 04/20/2017 1728 Gross per 24 hour  Intake 715.5 ml  Output 2900 ml  Net -2184.5 ml     Physical Exam  Gen:- Awake Alert,  In no apparent distress HEENT:- [place].AT, No sclera icterus Neck-Supple Neck,No  JVD,.  Lungs-diminished in bases, no wheezing, scattered rhonchi CV- S1, S2 normal, irregularly irregular heart rate 88 Abd-  +ve B.Sounds, Abd Soft, No tenderness,    Extremity/Skin:-Chronic upper and lower extremity ulcers please see documentation from wound care RN Psych-affect is appropriate, more cooperative Neuro-generalized  weakness without new focal deficits    Data Review:   Micro Results Recent Results (from the past 240 hour(s))  Blood Culture (routine x 2)     Status: None (Preliminary result)   Collection Time: 04/17/17  7:54 PM  Result Value Ref Range Status   Specimen Description BLOOD LEFT ANTECUBITAL  Final   Special Requests   Final    BOTTLES DRAWN AEROBIC AND ANAEROBIC Blood Culture adequate volume   Culture   Final    NO GROWTH 3 DAYS Performed at [facility], [address], [place], [place] [address]    Report Status PENDING  Incomplete  Blood Culture (routine x 2)     Status: None (Preliminary result)   Collection Time: 04/17/17  8:07 PM  Result Value Ref Range Status   Specimen Description BLOOD RIGHT ARM  Final   Special Requests   Final    BOTTLES DRAWN AEROBIC AND ANAEROBIC Blood Culture adequate volume   Culture   Final    NO GROWTH 3 DAYS Performed at [facility], [address] [place], [place] [address]    Report Status PENDING  Incomplete  Urine culture     Status: None   Collection Time: 04/17/17  8:14 PM  Result Value Ref Range Status   Specimen Description URINE, CATHETERIZED  Final   Special Requests NONE  Final   Culture   Final    NO GROWTH Performed at [facility], [address] [place], [place] [address]    Report Status 04/19/2017 FINAL  Final    Radiology Reports Dg Tibia/fibula Right  Result Date: 04/18/2017 CLINICAL DATA:  Cellulitis EXAM: RIGHT TIBIA AND FIBULA - 2 VIEW COMPARISON:  Right ankle radiographs March 30, 2017 FINDINGS: Frontal and lateral views were obtained. There is a total knee  replacement with prosthetic components well-seated. No acute fracture or dislocation. No erosive change or bony destruction evident. There is extensive arterial vascular calcification as well as soft tissue calcification which may be indicative of chronic stasis with likely cellulitis. There are spurs arising from posterior inferior calcaneus. There are plantar fascia calcifications. A bandage overlies the distal tibia and fibula. IMPRESSION: No fracture or dislocation. No erosive change or bony destruction. 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 Soft tissue calcifications likely due to chronic stasis and cellulitis. Extensive arterial vascular calcification also noted in the trifurcation and popliteal arteries. There is a plantar fascia calcification as well as calcaneal spurs. Electronically Signed   By: Lowella Grip III M.D.   On: 04/18/2017 07:55   Dg Ankle 2 Views Right  Result Date: 04/10/2017 CLINICAL DATA:  Nonhealing ulcer of the medial malleolus. EXAM: RIGHT ANKLE - 2 VIEW COMPARISON:  None. FINDINGS: There is no evidence of fracture, dislocation, or joint effusion. There is no aggressive osseous lesion. There is a plantar calcaneal spur. 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 There is no periosteal reaction or bone destruction. There is a soft tissue ulcer overlying the medial malleolus. There is peripheral vascular atherosclerotic disease. There is partial ossification of the plantar fascia adjacent to the calcaneal insertion. IMPRESSION: No evidence of osteomyelitis of the right ankle. Electronically Signed   By: Kathreen Devoid   On: 04/10/2017 08:32   Ct Head Wo Contrast  Result Date: 04/17/2017 CLINICAL DATA:  Acute onset of altered mental status. Atrial fibrillation. EXAM: CT HEAD WITHOUT CONTRAST TECHNIQUE: Contiguous axial images were obtained from the base  of the skull through the vertex without intravenous  contrast. COMPARISON:  CT of the head performed 07/06/2015 FINDINGS: Brain: No evidence of acute infarction, hemorrhage, hydrocephalus, extra-axial collection or mass lesion / mass effect. Prominence of the ventricles and sulci reflects mild cortical volume loss. Scattered periventricular white matter change likely reflects small vessel ischemic microangiopathy. The brainstem and fourth ventricle are within normal limits. The basal ganglia are unremarkable in appearance. The cerebral hemispheres demonstrate grossly normal gray-white differentiation. No mass effect or midline shift is seen. Vascular: No hyperdense vessel or unexpected calcification. Skull: There is no evidence of fracture; visualized osseous structures are unremarkable in appearance. Sinuses/Orbits: The orbits are within normal limits. The paranasal sinuses and mastoid air cells are well-aerated. Other: No significant soft tissue abnormalities are seen. IMPRESSION: 1. No acute intracranial pathology seen on CT. 2. Mild cortical volume loss and scattered small vessel ischemic microangiopathy. Electronically Signed   By: Garald Balding M.D.   On: 04/17/2017 21:35   Dg Chest Port 1 View  Result Date: 04/19/2017 CLINICAL DATA:  84 year old male with shortness of breath, agitation. EXAM: PORTABLE CHEST 1 VIEW COMPARISON:  04/17/2017 and earlier. FINDINGS: Portable AP semi upright view at 0642 hrs. Increased veiling opacity at both lung bases. Increased streaky opacity about the right hilum. Portion of the diaphragm is now obscured. Stable cardiac size and mediastinal contours. No pneumothorax. Pulmonary vascularity appears stable without overt edema. Chronic appearing right lateral 8th rib fracture. Negative visible bowel gas pattern. Extensive postoperative changes to the cervical spine. IMPRESSION: Increased bibasilar pulmonary opacity greater on the right is nonspecific. Favor new or increased pleural effusions since 04/17/2017 with atelectasis.  Electronically Signed   By: Genevie Ann M.D.   On: 04/19/2017 07:39   Dg Chest Portable 1 View  Result Date: 04/17/2017 CLINICAL DATA:  Acute onset of fever.  Atrial fibrillation. EXAM: PORTABLE CHEST 1 VIEW COMPARISON:  Chest radiograph performed 01/31/2015, and CT of the chest performed 07/06/2015 FINDINGS: The lungs are well-aerated. Left basilar airspace opacity could reflect pneumonia. There is no evidence of pleural effusion or pneumothorax. The cardiomediastinal silhouette is mildly enlarged. No acute osseous abnormalities are seen. IMPRESSION: 1. Left basilar airspace opacity could reflect pneumonia. 2. Mild cardiomegaly. Electronically Signed   By: Garald Balding M.D.   On: 04/17/2017 21:58   Dg Foot Complete Right  Result Date: 04/10/2017 CLINICAL DATA:  Ulcer of the right fourth toe. EXAM: RIGHT FOOT COMPLETE - 3+ VIEW COMPARISON:  None. FINDINGS: There is no fracture or dislocation. There is no periosteal reaction or bone destruction. There is no osteolysis. There is mild osteoarthritis of the first MTP joint and first TMT joint. There is partial ossification of the plantar fascia. There is peripheral vascular atherosclerotic disease. There is no focal soft tissue abnormality. IMPRESSION: No evidence of osteomyelitis of the right foot. Electronically Signed   By: Kathreen Devoid   On: 04/10/2017 08:33     CBC Recent Labs  Lab 04/17/17 1954 04/18/17 0532 04/19/17 0313 04/20/17 0214  WBC 19.0* 24.6* 19.2* 15.3*  HGB 11.8* 10.6* 10.4* 9.7*  HCT 38.5* 34.7* 33.7* 31.1*  PLT 142* 151 162 159  MCV 101.6* 102.7* 102.1* 101.0*  MCH 31.1 31.4 31.5 31.5  MCHC 30.6 30.5 30.9 31.2  RDW 14.9 15.3 15.3 14.8  LYMPHSABS 1.1 0.8  --   --   MONOABS 1.3* 1.1*  --   --   EOSABS 0.0 0.0  --   --   BASOSABS 0.0 0.0  --   --  Chemistries  Recent Labs  Lab 04/17/17 1954 04/18/17 0103 04/18/17 0532  NA 146*  --  143  K 3.7  --  3.9  CL 108  --  110  CO2 24  --  18*  GLUCOSE 111*  --  105*    BUN 28*  --  27*  CREATININE 1.50*  --  1.46*  CALCIUM 8.5*  --  7.4*  MG  --  1.6*  --   AST 103*  --  77*  ALT 75*  --  70*  ALKPHOS 75  --  66  BILITOT 1.2  --  1.1   ------------------------------------------------------------------------------------------------------------------ No results for input(s): CHOL, HDL, LDLCALC, TRIG, CHOLHDL, LDLDIRECT in the last 72 hours.  Lab Results  Component Value Date   HGBA1C 5.8 (H) 07/06/2015   ------------------------------------------------------------------------------------------------------------------ Recent Labs    04/17/17 1954  TSH 1.782   ------------------------------------------------------------------------------------------------------------------ Recent Labs    04/18/17 0810  VITAMINB12 884  FOLATE 36.6  FERRITIN 202  TIBC 228*  IRON 12*  RETICCTPCT 2.7    Coagulation profile Recent Labs  Lab 04/18/17 0103  INR 1.34    No results for input(s): DDIMER in the last 72 hours.  Cardiac Enzymes Recent Labs  Lab 04/18/17 0103 04/18/17 0532 04/18/17 1114  TROPONINI 0.14* 0.35* 0.49*   ------------------------------------------------------------------------------------------------------------------ No results found for: BNP   Aishi Courts M.D on 04/20/2017 at 6:53 PM  Between 7am to 7pm - Pager - [phone]  After 7pm go to [email or web address] - password [email or web address]  [facility] -  Office  [phone]   Voice Recognition Viviann Spare dictation system was used to create this note, attempts have been made to correct errors. Please contact the author with questions and/or clarifications.

## 2017-04-21 ENCOUNTER — Inpatient Hospital Stay (HOSPITAL_COMMUNITY): Payer: Medicare Other

## 2017-04-21 LAB — BASIC METABOLIC PANEL
Anion gap: 13 (ref 5–15)
BUN: 22 mg/dL — ABNORMAL HIGH (ref 6–20)
CO2: 23 mmol/L (ref 22–32)
Calcium: 7.2 mg/dL — ABNORMAL LOW (ref 8.9–10.3)
Chloride: 104 mmol/L (ref 101–111)
Creatinine, Ser: 1.04 mg/dL (ref 0.61–1.24)
GFR calc Af Amer: >60 mL/min (ref >60)
GFR calc non Af Amer: >60 mL/min (ref >60)
Glucose, Bld: 95 mg/dL (ref 65–99)
Potassium: 2.4 mmol/L — CL (ref 3.5–5.1)
Sodium: 140 mmol/L (ref 135–145)

## 2017-04-21 LAB — CBC
HCT: 31.1 % — ABNORMAL LOW (ref 39.0–52.0)
Hemoglobin: 9.6 g/dL — ABNORMAL LOW (ref 13.0–17.0)
MCH: 30.9 pg (ref 26.0–34.0)
MCHC: 30.9 g/dL (ref 30.0–36.0)
MCV: 100 fL (ref 78.0–100.0)
Platelets: 163 10*3/uL (ref 150–400)
RBC: 3.11 MIL/uL — ABNORMAL LOW (ref 4.22–5.81)
RDW: 14.7 % (ref 11.5–15.5)
WBC: 10.1 10*3/uL (ref 4.0–10.5)

## 2017-04-21 LAB — HEPARIN LEVEL (UNFRACTIONATED): Heparin Unfractionated: 0.44 IU/mL (ref 0.30–0.70)

## 2017-04-21 LAB — MAGNESIUM: Magnesium: 2.2 mg/dL (ref 1.7–2.4)

## 2017-04-21 MED ORDER — HALOPERIDOL LACTATE 5 MG/ML IJ SOLN
5.0000 mg | Freq: Once | INTRAMUSCULAR | Status: AC
  Administered 2017-04-22: 5 mg via INTRAVENOUS
  Filled 2017-04-21: qty 1

## 2017-04-21 MED ORDER — PREDNISONE 10 MG (21) PO TBPK
10.0000 mg | ORAL_TABLET | ORAL | Status: AC
  Administered 2017-04-21: 10 mg via ORAL

## 2017-04-21 MED ORDER — PREDNISONE 10 MG (21) PO TBPK: 20.0000 mg | ORAL_TABLET | Freq: Every evening | ORAL | Status: DC

## 2017-04-21 MED ORDER — HYDRALAZINE HCL 20 MG/ML IJ SOLN
10.0000 mg | INTRAMUSCULAR | Status: DC | PRN
  Administered 2017-04-21 – 2017-04-22 (×2): 10 mg via INTRAVENOUS
  Filled 2017-04-21 (×2): qty 1

## 2017-04-21 MED ORDER — WHITE PETROLATUM EX OINT
TOPICAL_OINTMENT | CUTANEOUS | Status: AC
  Filled 2017-04-21: qty 28.35

## 2017-04-21 MED ORDER — PREDNISONE 10 MG (21) PO TBPK: 10.0000 mg | ORAL_TABLET | Freq: Three times a day (TID) | ORAL | Status: DC

## 2017-04-21 MED ORDER — PREDNISONE 10 MG (21) PO TBPK: 10.0000 mg | ORAL_TABLET | Freq: Four times a day (QID) | ORAL | Status: DC

## 2017-04-21 MED ORDER — POTASSIUM CHLORIDE CRYS ER 20 MEQ PO TBCR
40.0000 meq | EXTENDED_RELEASE_TABLET | Freq: Once | ORAL | Status: AC
  Administered 2017-04-21: 40 meq via ORAL
  Filled 2017-04-21: qty 2

## 2017-04-21 MED ORDER — DILTIAZEM HCL 30 MG PO TABS
30.0000 mg | ORAL_TABLET | Freq: Three times a day (TID) | ORAL | Status: DC
  Filled 2017-04-21: qty 1

## 2017-04-21 MED ORDER — PREDNISONE 10 MG PO TABS: 10.0000 mg | ORAL_TABLET | Freq: Every day | ORAL | Status: DC

## 2017-04-21 MED ORDER — ATENOLOL 25 MG PO TABS
25.0000 mg | ORAL_TABLET | Freq: Every day | ORAL | Status: DC
  Administered 2017-04-22: 25 mg via ORAL
  Filled 2017-04-21 (×2): qty 1

## 2017-04-21 MED ORDER — POTASSIUM CHLORIDE 10 MEQ/100ML IV SOLN
10.0000 meq | INTRAVENOUS | Status: AC
  Administered 2017-04-21 (×4): 10 meq via INTRAVENOUS
  Filled 2017-04-21 (×4): qty 100

## 2017-04-21 MED ORDER — LOSARTAN POTASSIUM 50 MG PO TABS
100.0000 mg | ORAL_TABLET | Freq: Every day | ORAL | Status: DC
  Administered 2017-04-22 – 2017-04-25 (×4): 100 mg via ORAL
  Filled 2017-04-21 (×6): qty 2

## 2017-04-21 MED ORDER — FUROSEMIDE 10 MG/ML IJ SOLN
40.0000 mg | Freq: Once | INTRAMUSCULAR | Status: AC
  Administered 2017-04-21: 40 mg via INTRAVENOUS
  Filled 2017-04-21: qty 4

## 2017-04-21 MED ORDER — DILTIAZEM HCL 30 MG PO TABS
30.0000 mg | ORAL_TABLET | Freq: Four times a day (QID) | ORAL | Status: DC
  Administered 2017-04-21: 30 mg via ORAL
  Filled 2017-04-21: qty 1

## 2017-04-21 MED ORDER — PREDNISONE 10 MG (21) PO TBPK
20.0000 mg | ORAL_TABLET | Freq: Every morning | ORAL | Status: AC
  Administered 2017-04-21: 20 mg via ORAL
  Filled 2017-04-21: qty 21

## 2017-04-21 NOTE — Progress Notes (Signed)
ANTICOAGULATION _ ANTIBIOTIC CONSULT NOTE - Follow Up Consult  Pharmacy Consult for Heparin;  Vancomycin Indication: atrial fibrillation;  sepsis  Allergies  Allergen Reactions  . Demerol [Meperidine] Nausea And Vomiting  . Keppra [Levetiracetam] Other (See Comments)    Causes sleepiness    Patient Measurements: Height: 5\' 10"  (177.8 cm) Weight: 190 lb 11.2 oz (86.5 kg) IBW/kg (Calculated) : 73 Heparin Dosing Weight: 87 kg  Vital Signs: Temp: 97.7 F (36.5 C) (02/10 0816) Temp Source: Oral (02/10 0816) BP: 168/86 (02/10 0816) Pulse Rate: 85 (02/10 0410)  Labs: Recent Labs    04/19/17 0313 04/20/17 0214 04/20/17 1243 04/21/17 0356  HGB 10.4* 9.7*  --  9.6*  HCT 33.7* 31.1*  --  31.1*  PLT 162 159  --  163  HEPARINUNFRC 0.37 <0.10* 0.48 0.44  CREATININE  --   --   --  1.04    Estimated Creatinine Clearance: 57.5 mL/min (by C-G formula based on SCr of 1.04 mg/dL).  Assessment:  84 yr old male continues on IV heparin for atrial fibrillation.   Heparin level remains therapeutic (0.44) on 1200 units/hr. CBC stable.  Noted plan for ASA 81 mg daily at discharge.    Day # 4 Vancomycin for sepsis coverage; has chronic leg wounds. Also on Ceftriaxone and Azithromycin for CAP. Afebrile, WBC down to 10.1 (on steroids). Blood cultures negative x 4 days to date.  Influenza panel negative. Scr 1.46>>1.04. Vancomycin empiric regimen remains appropriate.  Goal of Therapy:  Heparin level 0.3-0.7 units/ml Monitor platelets by anticoagulation protocol: Yes  Vancomycin trough levels 15-20 mcg/ml   Plan:   Continue heparin drip at 1200 units/hr  Daily heparin level and CBC while on heparin.  Continue Vancomycin 750 mg IV q12hrs.  Continues on Ceftriaxone 1 gm IV and Azithromycin 500 mg IV q24hrs.  Follow up renal function, culture date, progress and antibiotic plans.  Stop Vancomycin soon?  Arty Baumgartner, [place] Pager: [phone] or [phone] 04/21/2017,12:29 PM

## 2017-04-21 NOTE — Progress Notes (Signed)
CRITICAL VALUE ALERT  Critical Value:  Potassium 2.4  Date & Time Notied: 21 Apr 538  Provider Notified: Hulen Luster NP  Orders Received/Actions taken: 4 runs of kcl

## 2017-04-21 NOTE — Progress Notes (Signed)
PT Cancellation Note  Patient Details Name: [name] MRN: [id number] DOB: 07-11-1933   Cancelled Treatment:    Reason Eval/Treat Not Completed: Patient not medically ready(patient with low K will hold at this time)   Duncan Dull 04/21/2017, 7:50 AM

## 2017-04-21 NOTE — Progress Notes (Signed)
Pt is extremely agitated family and sitter at the bedside, HR 120's 130 pt refused earlier meds for HR paged Mid level C. Bodenhiemer who ordered one time dose of prn haldol will continue to monitor

## 2017-04-21 NOTE — Progress Notes (Signed)
Patient Demographics:    [name], is a 84 y.o. male, DOB - 22-Sep-1933, IWL:[id number]  Admit date - 04/17/2017   Admitting Physician [name], MD  Outpatient Primary MD for the patient is [name], MD  LOS - 4   Chief Complaint  Patient presents with  . Atrial Fibrillation  . Code Sepsis        Subjective:    [name] today has no fevers, no emesis,  No chest pain,  Pt has cough, wheezing, wife at bedside,   Assessment  & Plan :    Principal Problem:   Sepsis ([place]) Active Problems:   Essential hypertension   Hypothyroidism   Acute encephalopathy   Seizures ([place])   Memory difficulty   CAP (community acquired pneumonia)   Atrial fibrillation with RVR ([place])  Brief summary  84 y.o. male with history of chronic leg wounds (Rt > Lt), HTN, CKD, seizures, memory loss, hypothyroidism admitted on 04/17/17 with increased confusion leukocytosis and concerns about sepsis secondary to pneumonia plus/minus lower extremity cellulitis.  Patient was found to have a fibrillation with RVR on admission which is new for him   Plan:- 1)Sepsis in the setting of Pneumonia and chronic Leg wounds -overall improving, leukocytosis has resolved, white count is down to 10,000, much less confused, influenza negative, continue Zithromax and Rocephin (started 04/17/17) , blood and urine cultures negative so far   2)Afib-most likely triggered by #1 above, no prior history of A. fib, currently on IV heparin,  echocardiogram with EF of 60 to 65 % without regional wall motion abnormalities and TSH 1.7, risk versus benefit of chronic anticoagulation d/w with patient and son Data processing manager) and wife at bedside, CHADs2Vasc score is 4 with annual stroke risk of 4.8 to 6.7%, however HASBLeD score is 2 with risk of major bleeding of 1.88 to 4.1%, at this time patient, wife and son declined full anticoagulation for stroke  prophylaxis at the time of discharge , for now they would like to continue IV heparin when patient is here and upon discharge use just aspirin 81 mg daily, use p.o. Cardizem 30 mg TID for rate control  3)Sz DO- stable, continue Keppra and Depakote  4)CKD III-improved, creatinine is down to 1.0 from 1.5,  avoid nephrotoxic agents maintain adequate hydration  5)Immunology-patient is on chronic steroid therapy for polyarthritis, pt is on stress dose steroids, taper steroids as high-dose steroids  may contribute to confusion and psychosis/delirium  6)Multiple decubitus ulcers-continue dressings to lower extremity ulcers, patient goes to wound care clinic  7)Acute encephalopathy/confusion-most likely secondary to #1 above, mostly resolved at this time, continue Seroquel 25 mg nightly  8)Disposition Plan  : Home with home health, get PT eval  Code Status : full  Consults  : Cardiology and wound care  DVT Prophylaxis  :    Iv Heparin -    Lab Results  Component Value Date   PLT 163 04/21/2017    Inpatient Medications  Scheduled Meds: . albuterol  2.5 mg Nebulization TID  . aspirin EC  81 mg Oral QHS  . diltiazem  30 mg Oral Q6H  . divalproex  250 mg Oral Daily  . divalproex  750 mg Oral QHS  . docusate sodium  200 mg Oral BID  . ezetimibe  5 mg Oral QHS  . gabapentin  300 mg Oral BID  . guaiFENesin  20 mL Oral QID  . hydrocortisone sod succinate (SOLU-CORTEF) inj  50 mg Intravenous Q8H  . levothyroxine  150 mcg Oral QAC breakfast  . pravastatin  40 mg Oral QHS  . QUEtiapine  25 mg Oral QPM  . white petrolatum      . zinc sulfate  220 mg Oral q morning - 10a   Continuous Infusions: . azithromycin Stopped (04/21/17 0000)  . cefTRIAXone (ROCEPHIN)  IV Stopped (04/20/17 2230)  . heparin 1,200 Units/hr (04/21/17 1546)  . vancomycin Stopped (04/21/17 1005)   PRN Meds:.acetaminophen **OR** acetaminophen, HYDROcodone-acetaminophen, nitroGLYCERIN, ondansetron **OR** ondansetron  (ZOFRAN) IV    Anti-infectives (From admission, onward)   Start     Dose/Rate Route Frequency Ordered Stop   04/19/17 0800  vancomycin (VANCOCIN) 1,250 mg in sodium chloride 0.9 % 250 mL IVPB  Status:  Discontinued     1,250 mg 166.7 mL/hr over 90 Minutes Intravenous Every 24 hours 04/18/17 0738 04/18/17 0741   04/18/17 2200  azithromycin (ZITHROMAX) 500 mg in dextrose 5 % 250 mL IVPB     500 mg 250 mL/hr over 60 Minutes Intravenous Every 24 hours 04/17/17 2316 04/25/17 2159   04/18/17 2000  cefTRIAXone (ROCEPHIN) 1 g in dextrose 5 % 50 mL IVPB     1 g 100 mL/hr over 30 Minutes Intravenous Every 24 hours 04/17/17 2316 04/25/17 2059   04/18/17 2000  vancomycin (VANCOCIN) IVPB 750 mg/150 ml premix     750 mg 150 mL/hr over 60 Minutes Intravenous Every 12 hours 04/18/17 0741     04/18/17 0800  vancomycin (VANCOCIN) 1,750 mg in sodium chloride 0.9 % 500 mL IVPB  Status:  Discontinued     1,750 mg 250 mL/hr over 120 Minutes Intravenous  Once 04/18/17 0738 04/18/17 0741   04/18/17 0800  vancomycin (VANCOCIN) 1,750 mg in sodium chloride 0.9 % 500 mL IVPB     1,750 mg 250 mL/hr over 120 Minutes Intravenous  Once 04/18/17 0741 04/18/17 1033   04/17/17 2130  cefTRIAXone (ROCEPHIN) 1 g in dextrose 5 % 50 mL IVPB     1 g 100 mL/hr over 30 Minutes Intravenous  Once 04/17/17 2127 04/17/17 2215   04/17/17 2130  azithromycin (ZITHROMAX) 500 mg in dextrose 5 % 250 mL IVPB     500 mg 250 mL/hr over 60 Minutes Intravenous  Once 04/17/17 2127 04/17/17 2245        Objective:   Vitals:   04/21/17 0413 04/21/17 0816 04/21/17 1000 04/21/17 1309  BP:  (!) 168/86  (!) 165/85  Pulse:   (!) 101 (!) 101  Resp:   (!) 24 20  Temp:  97.7 F (36.5 C)  97.7 F (36.5 C)  TempSrc:  Oral  Oral  SpO2:   96% 100%  Weight: 86.5 kg (190 lb 11.2 oz)     Height:        Wt Readings from Last 3 Encounters:  04/21/17 86.5 kg (190 lb 11.2 oz)  09/22/15 88.5 kg (195 lb)  07/20/15 88.2 kg (194 lb 8 oz)      Intake/Output Summary (Last 24 hours) at 04/21/2017 1638 Last data filed at 04/21/2017 1500 Gross per 24 hour  Intake 1059 ml  Output 2400 ml  Net -1341 ml     Physical Exam  Gen:- Awake Alert,  In no apparent distress HEENT:- .AT,  No sclera icterus Neck-Supple Neck,No JVD,.  Lungs-diminished in bases, no wheezing, scattered rhonchi CV- S1, S2 normal, irregularly irregular heart rate 88 Abd-  +ve B.Sounds, Abd Soft, No tenderness,    Extremity/Skin:-Chronic upper and lower extremity ulcers please see documentation from wound care RN Psych-affect is appropriate, more cooperative Neuro-generalized weakness without new focal deficits    Data Review:   Micro Results Recent Results (from the past 240 hour(s))  Blood Culture (routine x 2)     Status: None (Preliminary result)   Collection Time: 04/17/17  7:54 PM  Result Value Ref Range Status   Specimen Description BLOOD LEFT ANTECUBITAL  Final   Special Requests   Final    BOTTLES DRAWN AEROBIC AND ANAEROBIC Blood Culture adequate volume   Culture   Final    NO GROWTH 4 DAYS Performed at [facility], [address], [place], [place] [address]    Report Status PENDING  Incomplete  Blood Culture (routine x 2)     Status: None (Preliminary result)   Collection Time: 04/17/17  8:07 PM  Result Value Ref Range Status   Specimen Description BLOOD RIGHT ARM  Final   Special Requests   Final    BOTTLES DRAWN AEROBIC AND ANAEROBIC Blood Culture adequate volume   Culture   Final    NO GROWTH 4 DAYS Performed at [facility], [place] [address] [place], [place] [address]    Report Status PENDING  Incomplete  Urine culture     Status: None   Collection Time: 04/17/17  8:14 PM  Result Value Ref Range Status   Specimen Description URINE, CATHETERIZED  Final   Special Requests NONE  Final   Culture   Final    NO GROWTH Performed at [facility], [address] [place], [place] [address]    Report Status  04/19/2017 FINAL  Final    Radiology Reports Dg Tibia/fibula Right  Result Date: 04/18/2017 CLINICAL DATA:  Cellulitis EXAM: RIGHT TIBIA AND FIBULA - 2 VIEW COMPARISON:  Right ankle radiographs March 30, 2017 FINDINGS: Frontal and lateral views were obtained. 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 No acute fracture or dislocation. No erosive change or bony destruction evident. There is extensive arterial vascular calcification as well as soft tissue calcification which may be indicative of chronic stasis with likely cellulitis. There are spurs arising from posterior inferior calcaneus. There are plantar fascia calcifications. A bandage overlies the distal tibia and fibula. IMPRESSION: No fracture or dislocation. No erosive change or bony destruction. 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 Soft tissue calcifications likely due to chronic stasis and cellulitis. Extensive arterial vascular calcification also noted in the trifurcation and popliteal arteries. There is a plantar fascia calcification as well as calcaneal spurs. Electronically Signed   By: Lowella Grip III M.D.   On: 04/18/2017 07:55   Dg Ankle 2 Views Right  Result Date: 04/10/2017 CLINICAL DATA:  Nonhealing ulcer of the medial malleolus. EXAM: RIGHT ANKLE - 2 VIEW COMPARISON:  None. FINDINGS: There is no evidence of fracture, dislocation, or joint effusion. There is no aggressive osseous lesion. There is a plantar calcaneal spur. 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 There is no periosteal reaction or bone destruction. There is a soft tissue ulcer overlying the medial malleolus. There is peripheral vascular atherosclerotic disease. There is partial ossification of the plantar fascia adjacent to the calcaneal insertion. IMPRESSION: No evidence of  osteomyelitis of the right ankle. Electronically Signed   By: Kathreen Devoid   On:  04/10/2017 08:32   Ct Head Wo Contrast  Result Date: 04/17/2017 CLINICAL DATA:  Acute onset of altered mental status. Atrial fibrillation. EXAM: CT HEAD WITHOUT CONTRAST TECHNIQUE: Contiguous axial images were obtained from the base of the skull through the vertex without intravenous contrast. COMPARISON:  CT of the head performed 07/06/2015 FINDINGS: Brain: No evidence of acute infarction, hemorrhage, hydrocephalus, extra-axial collection or mass lesion / mass effect. Prominence of the ventricles and sulci reflects mild cortical volume loss. Scattered periventricular white matter change likely reflects small vessel ischemic microangiopathy. The brainstem and fourth ventricle are within normal limits. The basal ganglia are unremarkable in appearance. The cerebral hemispheres demonstrate grossly normal gray-white differentiation. No mass effect or midline shift is seen. Vascular: No hyperdense vessel or unexpected calcification. Skull: There is no evidence of fracture; visualized osseous structures are unremarkable in appearance. Sinuses/Orbits: The orbits are within normal limits. The paranasal sinuses and mastoid air cells are well-aerated. Other: No significant soft tissue abnormalities are seen. IMPRESSION: 1. No acute intracranial pathology seen on CT. 2. Mild cortical volume loss and scattered small vessel ischemic microangiopathy. Electronically Signed   By: Garald Balding M.D.   On: 04/17/2017 21:35   Dg Chest Port 1 View  Result Date: 04/21/2017 CLINICAL DATA:  Short of breath, chest pain EXAM: PORTABLE CHEST 1 VIEW COMPARISON:  04/19/2017 FINDINGS: Normal cardiac silhouette. Bibasilar effusions again noted. Bibasilar atelectasis present. Upper lungs are clear. No acute osseous abnormality. Posterior cervical fusion. IMPRESSION: No interval change.  Bilateral moderate pleural effusions. Electronically Signed   By: Suzy Bouchard M.D.   On: 04/21/2017 13:48   Dg Chest Port 1 View  Result Date:  04/19/2017 CLINICAL DATA:  84 year old male with shortness of breath, agitation. EXAM: PORTABLE CHEST 1 VIEW COMPARISON:  04/17/2017 and earlier. FINDINGS: Portable AP semi upright view at 0642 hrs. Increased veiling opacity at both lung bases. Increased streaky opacity about the right hilum. Portion of the diaphragm is now obscured. Stable cardiac size and mediastinal contours. No pneumothorax. Pulmonary vascularity appears stable without overt edema. Chronic appearing right lateral 8th rib fracture. Negative visible bowel gas pattern. Extensive postoperative changes to the cervical spine. IMPRESSION: Increased bibasilar pulmonary opacity greater on the right is nonspecific. Favor new or increased pleural effusions since 04/17/2017 with atelectasis. Electronically Signed   By: Genevie Ann M.D.   On: 04/19/2017 07:39   Dg Chest Portable 1 View  Result Date: 04/17/2017 CLINICAL DATA:  Acute onset of fever.  Atrial fibrillation. EXAM: PORTABLE CHEST 1 VIEW COMPARISON:  Chest radiograph performed 01/31/2015, and CT of the chest performed 07/06/2015 FINDINGS: The lungs are well-aerated. Left basilar airspace opacity could reflect pneumonia. There is no evidence of pleural effusion or pneumothorax. The cardiomediastinal silhouette is mildly enlarged. No acute osseous abnormalities are seen. IMPRESSION: 1. Left basilar airspace opacity could reflect pneumonia. 2. Mild cardiomegaly. Electronically Signed   By: Garald Balding M.D.   On: 04/17/2017 21:58   Dg Foot Complete Right  Result Date: 04/10/2017 CLINICAL DATA:  Ulcer of the right fourth toe. EXAM: RIGHT FOOT COMPLETE - 3+ VIEW COMPARISON:  None. FINDINGS: There is no fracture or dislocation. There is no periosteal reaction or bone destruction. There is no osteolysis. There is mild osteoarthritis of the first MTP joint and first TMT joint. There is partial ossification of the plantar fascia. There is peripheral vascular atherosclerotic disease. There is no  focal  soft tissue abnormality. IMPRESSION: No evidence of osteomyelitis of the right foot. Electronically Signed   By: Kathreen Devoid   On: 04/10/2017 08:33     CBC Recent Labs  Lab 04/17/17 1954 04/18/17 0532 04/19/17 0313 04/20/17 0214 04/21/17 0356  WBC 19.0* 24.6* 19.2* 15.3* 10.1  HGB 11.8* 10.6* 10.4* 9.7* 9.6*  HCT 38.5* 34.7* 33.7* 31.1* 31.1*  PLT 142* 151 162 159 163  MCV 101.6* 102.7* 102.1* 101.0* 100.0  MCH 31.1 31.4 31.5 31.5 30.9  MCHC 30.6 30.5 30.9 31.2 30.9  RDW 14.9 15.3 15.3 14.8 14.7  LYMPHSABS 1.1 0.8  --   --   --   MONOABS 1.3* 1.1*  --   --   --   EOSABS 0.0 0.0  --   --   --   BASOSABS 0.0 0.0  --   --   --     Chemistries  Recent Labs  Lab 04/17/17 1954 04/18/17 0103 04/18/17 0532 04/21/17 0356 04/21/17 0837  NA 146*  --  143 140  --   K 3.7  --  3.9 2.4*  --   CL 108  --  110 104  --   CO2 24  --  18* 23  --   GLUCOSE 111*  --  105* 95  --   BUN 28*  --  27* 22*  --   CREATININE 1.50*  --  1.46* 1.04  --   CALCIUM 8.5*  --  7.4* 7.2*  --   MG  --  1.6*  --   --  2.2  AST 103*  --  77*  --   --   ALT 75*  --  70*  --   --   ALKPHOS 75  --  66  --   --   BILITOT 1.2  --  1.1  --   --    ------------------------------------------------------------------------------------------------------------------ No results for input(s): CHOL, HDL, LDLCALC, TRIG, CHOLHDL, LDLDIRECT in the last 72 hours.  Lab Results  Component Value Date   HGBA1C 5.8 (H) 07/06/2015   ------------------------------------------------------------------------------------------------------------------ No results for input(s): TSH, T4TOTAL, T3FREE, THYROIDAB in the last 72 hours.  Invalid input(s): FREET3 ------------------------------------------------------------------------------------------------------------------ No results for input(s): VITAMINB12, FOLATE, FERRITIN, TIBC, IRON, RETICCTPCT in the last 72 hours.  Coagulation profile Recent Labs  Lab 04/18/17 0103    INR 1.34    No results for input(s): DDIMER in the last 72 hours.  Cardiac Enzymes Recent Labs  Lab 04/18/17 0103 04/18/17 0532 04/18/17 1114  TROPONINI 0.14* 0.35* 0.49*   ------------------------------------------------------------------------------------------------------------------ No results found for: BNP   Carri Spillers M.D on 04/21/2017 at 4:38 PM  Between 7am to 7pm - Pager - [phone]  After 7pm go to [email or web address] - password [email or web address]  [facility] -  Office  [phone]   Voice Recognition Viviann Spare dictation system was used to create this note, attempts have been made to correct errors. Please contact the author with questions and/or clarifications.

## 2017-04-21 NOTE — Progress Notes (Signed)
MD made aware of x-ray results

## 2017-04-21 NOTE — Progress Notes (Signed)
Called into patient's room at this time because he is short of breath. He is tachypnic and work of breathing is mildly increased. Called Respiratory and they are coming to see the patient but the doctor was also notified of the situation so he can come see the patient as well. Will continue to monitor.

## 2017-04-21 NOTE — Progress Notes (Signed)
   04/21/17 2240  Vitals  BP (!) 187/98  MAP (mmHg) 125  BP Location Right Arm  BP Method Automatic  Pulse Rate (!) 108  Pulse Rate Source Monitor  ECG Heart Rate (!) 107  Cardiac Rhythm Atrial fibrillation  Resp (!) 21   Prn hydralazine given pt refused  All po med's after multiple attempts by myself as well as other staff to give meds pt,s son even attempted to give med's without success pt dosen't trust staff and belives we are holding him hostage

## 2017-04-22 ENCOUNTER — Inpatient Hospital Stay (HOSPITAL_COMMUNITY): Payer: Medicare Other

## 2017-04-22 DIAGNOSIS — I10 Essential (primary) hypertension: Secondary | ICD-10-CM

## 2017-04-22 DIAGNOSIS — J189 Pneumonia, unspecified organism: Secondary | ICD-10-CM

## 2017-04-22 DIAGNOSIS — I4891 Unspecified atrial fibrillation: Secondary | ICD-10-CM

## 2017-04-22 DIAGNOSIS — G9340 Encephalopathy, unspecified: Secondary | ICD-10-CM

## 2017-04-22 LAB — BASIC METABOLIC PANEL
Anion gap: 14 (ref 5–15)
Anion gap: 14 (ref 5–15)
BUN: 21 mg/dL — ABNORMAL HIGH (ref 6–20)
BUN: 22 mg/dL — ABNORMAL HIGH (ref 6–20)
CO2: 25 mmol/L (ref 22–32)
CO2: 26 mmol/L (ref 22–32)
Calcium: 7.9 mg/dL — ABNORMAL LOW (ref 8.9–10.3)
Calcium: 7.7 mg/dL — ABNORMAL LOW (ref 8.9–10.3)
Chloride: 105 mmol/L (ref 101–111)
Chloride: 103 mmol/L (ref 101–111)
Creatinine, Ser: 1.02 mg/dL (ref 0.61–1.24)
Creatinine, Ser: 1.06 mg/dL (ref 0.61–1.24)
GFR calc Af Amer: >60 mL/min (ref >60)
GFR calc Af Amer: >60 mL/min (ref >60)
GFR calc non Af Amer: >60 mL/min (ref >60)
GFR calc non Af Amer: >60 mL/min (ref >60)
Glucose, Bld: 141 mg/dL — ABNORMAL HIGH (ref 65–99)
Glucose, Bld: 106 mg/dL — ABNORMAL HIGH (ref 65–99)
Potassium: 2.7 mmol/L — CL (ref 3.5–5.1)
Potassium: 3.2 mmol/L — ABNORMAL LOW (ref 3.5–5.1)
Sodium: 144 mmol/L (ref 135–145)
Sodium: 143 mmol/L (ref 135–145)

## 2017-04-22 LAB — CULTURE, BLOOD (ROUTINE X 2)
Culture: NO GROWTH
Culture: NO GROWTH
Special Requests: ADEQUATE
Special Requests: ADEQUATE

## 2017-04-22 LAB — CBC
HCT: 32.7 % — ABNORMAL LOW (ref 39.0–52.0)
Hemoglobin: 10.5 g/dL — ABNORMAL LOW (ref 13.0–17.0)
MCH: 31.3 pg (ref 26.0–34.0)
MCHC: 32.1 g/dL (ref 30.0–36.0)
MCV: 97.3 fL (ref 78.0–100.0)
Platelets: 227 10*3/uL (ref 150–400)
RBC: 3.36 MIL/uL — ABNORMAL LOW (ref 4.22–5.81)
RDW: 14.4 % (ref 11.5–15.5)
WBC: 12.9 10*3/uL — ABNORMAL HIGH (ref 4.0–10.5)

## 2017-04-22 LAB — BLOOD GAS, ARTERIAL
Acid-Base Excess: 4.4 mmol/L — ABNORMAL HIGH (ref 0.0–2.0)
Bicarbonate: 29.5 mmol/L — ABNORMAL HIGH (ref 20.0–28.0)
Drawn by: 275531
O2 Content: 1 L/min
O2 Saturation: 96.3 %
Patient temperature: 98.6
pCO2 arterial: 40.9 mmHg (ref 32.0–48.0)
pH, Arterial: 7.453 — ABNORMAL HIGH (ref 7.350–7.450)
pO2, Arterial: 87.1 mmHg (ref 83.0–108.0)

## 2017-04-22 LAB — MAGNESIUM: Magnesium: 2 mg/dL (ref 1.7–2.4)

## 2017-04-22 LAB — HEPARIN LEVEL (UNFRACTIONATED): Heparin Unfractionated: 0.46 IU/mL (ref 0.30–0.70)

## 2017-04-22 MED ORDER — SODIUM CHLORIDE 0.9 % IV SOLN
500.0000 mg | INTRAVENOUS | Status: DC
  Administered 2017-04-22: 500 mg via INTRAVENOUS
  Filled 2017-04-22: qty 500

## 2017-04-22 MED ORDER — FLUTICASONE PROPIONATE 50 MCG/ACT NA SUSP
2.0000 | Freq: Every day | NASAL | Status: DC
  Administered 2017-04-23 – 2017-04-25 (×3): 2 via NASAL
  Filled 2017-04-22: qty 16

## 2017-04-22 MED ORDER — METOPROLOL TARTRATE 5 MG/5ML IV SOLN
5.0000 mg | Freq: Once | INTRAVENOUS | Status: AC
Start: 2017-04-22 — End: 2017-04-22
  Administered 2017-04-22: 5 mg via INTRAVENOUS
  Filled 2017-04-22: qty 5

## 2017-04-22 MED ORDER — POTASSIUM CHLORIDE CRYS ER 20 MEQ PO TBCR: 40.0000 meq | EXTENDED_RELEASE_TABLET | Freq: Two times a day (BID) | ORAL | Status: DC

## 2017-04-22 MED ORDER — PREDNISONE 20 MG PO TABS
50.0000 mg | ORAL_TABLET | Freq: Every day | ORAL | Status: DC
  Administered 2017-04-22 – 2017-04-23 (×2): 50 mg via ORAL
  Filled 2017-04-22 (×2): qty 2

## 2017-04-22 MED ORDER — FUROSEMIDE 10 MG/ML IJ SOLN: 40.0000 mg | Freq: Two times a day (BID) | INTRAMUSCULAR | Status: DC

## 2017-04-22 MED ORDER — POLYETHYLENE GLYCOL 3350 17 G PO PACK
17.0000 g | PACK | Freq: Every day | ORAL | Status: DC
  Administered 2017-04-22 – 2017-04-25 (×4): 17 g via ORAL
  Filled 2017-04-22 (×4): qty 1

## 2017-04-22 MED ORDER — POTASSIUM CHLORIDE 10 MEQ/100ML IV SOLN
10.0000 meq | INTRAVENOUS | Status: AC
  Administered 2017-04-22 (×3): 10 meq via INTRAVENOUS
  Filled 2017-04-22 (×3): qty 100

## 2017-04-22 MED ORDER — POTASSIUM CHLORIDE CRYS ER 20 MEQ PO TBCR
40.0000 meq | EXTENDED_RELEASE_TABLET | Freq: Once | ORAL | Status: AC
  Administered 2017-04-22: 40 meq via ORAL
  Filled 2017-04-22: qty 2

## 2017-04-22 MED ORDER — FUROSEMIDE 10 MG/ML IJ SOLN: 40.0000 mg | Freq: Once | INTRAMUSCULAR | Status: DC

## 2017-04-22 MED ORDER — AMLODIPINE BESYLATE 5 MG PO TABS
5.0000 mg | ORAL_TABLET | Freq: Every day | ORAL | Status: DC
  Administered 2017-04-22 – 2017-04-24 (×3): 5 mg via ORAL
  Filled 2017-04-22 (×3): qty 1

## 2017-04-22 MED ORDER — SODIUM CHLORIDE 0.9 % IV SOLN
1.0000 g | INTRAVENOUS | Status: AC
  Administered 2017-04-22 – 2017-04-24 (×3): 1 g via INTRAVENOUS
  Filled 2017-04-22 (×3): qty 10

## 2017-04-22 MED ORDER — LEVALBUTEROL HCL 0.63 MG/3ML IN NEBU
0.6300 mg | INHALATION_SOLUTION | Freq: Three times a day (TID) | RESPIRATORY_TRACT | Status: DC
  Administered 2017-04-22 – 2017-04-23 (×3): 0.63 mg via RESPIRATORY_TRACT
  Filled 2017-04-22: qty 3

## 2017-04-22 MED ORDER — LORAZEPAM 2 MG/ML IJ SOLN
1.0000 mg | Freq: Once | INTRAMUSCULAR | Status: AC
  Administered 2017-04-22: 1 mg via INTRAVENOUS

## 2017-04-22 MED ORDER — DILTIAZEM HCL 60 MG PO TABS
60.0000 mg | ORAL_TABLET | Freq: Three times a day (TID) | ORAL | Status: DC
  Administered 2017-04-22 – 2017-04-23 (×4): 60 mg via ORAL
  Filled 2017-04-22 (×4): qty 1

## 2017-04-22 MED ORDER — METOPROLOL TARTRATE 25 MG PO TABS
25.0000 mg | ORAL_TABLET | Freq: Two times a day (BID) | ORAL | Status: DC
  Administered 2017-04-22 – 2017-04-23 (×2): 25 mg via ORAL
  Filled 2017-04-22 (×2): qty 1

## 2017-04-22 MED ORDER — POTASSIUM CHLORIDE 10 MEQ/100ML IV SOLN
10.0000 meq | INTRAVENOUS | Status: AC
  Administered 2017-04-22 (×2): 10 meq via INTRAVENOUS
  Filled 2017-04-22 (×2): qty 100

## 2017-04-22 MED ORDER — LORAZEPAM 2 MG/ML IJ SOLN
INTRAMUSCULAR | Status: AC
  Filled 2017-04-22: qty 1

## 2017-04-22 MED ORDER — FUROSEMIDE 10 MG/ML IJ SOLN
40.0000 mg | Freq: Two times a day (BID) | INTRAMUSCULAR | Status: AC
  Administered 2017-04-22 – 2017-04-23 (×2): 40 mg via INTRAVENOUS
  Filled 2017-04-22 (×2): qty 4

## 2017-04-22 NOTE — Progress Notes (Signed)
   04/22/17 0350  Vitals  Temp (!) 97.5 F (36.4 C)  Temp Source Axillary  BP (!) 171/113  MAP (mmHg) 131  BP Location Right Arm  BP Method Automatic  Patient Position (if appropriate) Lying  Pulse Rate (!) 112  Pulse Rate Source Monitor  ECG Heart Rate (!) 122  Cardiac Rhythm Atrial fibrillation  Resp (!) 23   Prn hydralazine given

## 2017-04-22 NOTE — Consult Note (Signed)
            [facility] CM Primary Care Navigator  04/22/2017  [name] 08-31-1933 [id number]   Seen patient and wife [name]) at the bedside to identify possible discharge needs.Patient is alertandverbal but dosing on and off. He has a Air cabin crew at the bedside. Wifereportsthat patient had "weakness and fever" that had led to this admission.   Patient's wifeconfirmed Dr. [name] with [facility]  primary care provider.   [facility] [place] [address] to obtain medications withoutdifficulty.  According towife, [facility]   Wife mentioned that she has beenprovidingtransportation to his doctors' appointments.  Patient and wife lives at home and wife serves as the primary caregiver for him.  Anticipated discharge plan is skilled nursing facility (SNF) for rehabilitationper therapy recommendation.   Patient's wife voiced understanding to callprimary care provider's office once patient returnsbackhome,to schedulea post discharge follow-upwithin1-2 weeksor sooner if needed. Patient letter (with PCP's contact number) was provided as a reminder.   Explained topatient's wiferegardingTHN CM services available for health managementand resources at home.  Wifewas encouragedto seek referral from primary care provider to [facility] care management asdeemed necessary and appropriatefor services in the future-once patient returns back home.  [facility] care management information provided for future needs that patient may have.   For additional questions please contact:  Edwena Felty A. Candus Braud, BSN, RN-BC [facility] PRIMARY CARE Navigator Cell: [phone]

## 2017-04-22 NOTE — Progress Notes (Signed)
Pt continues to be extremely agitated HR 120's 140's afib, respirations 30-40's oxygen saturations 97% pt telling me he is ready for jesus thanks he is at home spoke with midlevel who ordered 5 mg lopressor x 1 and 1 mg ativan x 1 ; afterwards pt HR 98- 110's respirations 19-28 and pt resting somewhat better son at the bedside  Explained to son everything we were doing will continue to monitor

## 2017-04-22 NOTE — Progress Notes (Signed)
ANTICOAGULATION CONSULT NOTE - Follow Up Consult  Pharmacy Consult for Heparin Indication: atrial fibrillation  Allergies  Allergen Reactions  . Demerol [Meperidine] Nausea And Vomiting  . Keppra [Levetiracetam] Other (See Comments)    Causes sleepiness    Patient Measurements: Height: 5\' 10"  (177.8 cm) Weight: 189 lb 13.1 oz (86.1 kg) IBW/kg (Calculated) : 73 Heparin Dosing Weight: 87 kg  Vital Signs: Temp: 97.5 F (36.4 C) (02/11 0734) Temp Source: Axillary (02/11 0734) BP: 168/88 (02/11 0734) Pulse Rate: 112 (02/11 0734)  Labs: Recent Labs    04/20/17 0214 04/20/17 1243 04/21/17 0356 04/22/17 0247  HGB 9.7*  --  9.6* 10.5*  HCT 31.1*  --  31.1* 32.7*  PLT 159  --  163 227  HEPARINUNFRC <0.10* 0.48 0.44 0.46  CREATININE  --   --  1.04  --     Estimated Creatinine Clearance: 57.5 mL/min (by C-G formula based on SCr of 1.04 mg/dL).  Assessment: 84 yr old male continues on IV heparin for atrial fibrillation. Not on anticoagulation PTA.  Heparin level remains therapeutic. CBC stable. No bleed documented  Goal of Therapy:  Heparin level 0.3-0.7 units/ml Monitor platelets by anticoagulation protocol: Yes    Plan:  Continue heparin drip at 1200 units/hr Monitor daily heparin level and CBC, s/sx bleeding  Elicia Lamp, PharmD, BCPS Clinical Pharmacist Clinical phone for 04/22/2017 until 3:30pm: [phone] If after 3:30pm, please call main pharmacy at: [phone] 04/22/2017 10:20 AM

## 2017-04-22 NOTE — Progress Notes (Signed)
PROGRESS NOTE [facility]   RODOLPH HAGEMANN   FXT:[id number] DOB: Apr 09, 1933  DOA: 04/17/2017 PCP: [name], MD   Brief Narrative:  BLADYN TIPPS is a 84 year old male with medical history significant for seizure disorders, chronic leg and decubitus wound, hypertension, hyperlipidemia, polyarthritis on prednisone, hypothyroidism, CKD stage III and CAD who presented to the emergency department with confusion and found to be on A. fib with RVR and febrile by EMS.  Upon ED evaluation he was on A. fib with mild hypotension and elevated lactic acid.  Chest x-ray showed infiltrate concerning of pneumonia and patient was admitted for working diagnosis of acute encephalopathy, sepsis from pneumonia and new onset A. fib with [place] [facility] course has been complicated with delirium, hypoxia with worsening dyspnea chest x-ray shows increasing vascular congestion with moderate bilateral pleural effusions, echocardiogram obtain on 04/18/17 showed LVEF of 60-65%.  Cardiology has been consulted  Subjective: Patient seen and examined, he seems confused, and having difficulty breathing.  He denies chest pain  Assessment & Plan: Acute metabolic encephalopathy due to sepsis and hypoxia Treat underlying causes Improved  Sepsis secondary to pneumonia and chronic leg wounds Influenza PCR negative, patient being treated with Zithromax and Rocephin, blood cultures and urine cultures so far negative, WBCs trending down.  Continue prednisone taper  A. fib with RVR Likely triggered by infectious process, patient being treated with Cardizem 30 mg 3 times daily, rates has improved, telemetry monitor on and off of A. Fib. CHADsVACs 4, discuss it chronic anticoagulation with son, he prefer to hold at this point, will defer to cardiology for further recommendations. Patient currently on heparin drip.  Will switch atenolol for metoprolol for better rate control  Cardiology consulted  Acute respiratory failure  due to pneumonia and fluid overload Patient on oxygen supplementation, treat underlying conditions. Wean oxygen as tolerated History of OSA, has refused CPAP in the past  Acute on chronic diastolic heart failure Due to aggressive fluid resuscitation for sepsis Checks x-ray shows increasing vascular congestion and bilateral pleural effusion Echo showed preserved ejection fraction with moderate LVH and moderate/severe dilated right atrium.  Consistent with diastolic dysfunction Will add Lasix 40 mg IV twice daily Elevated troponin suspect demand ischemia from A. fib and infectious process Monitor creatinine closely  Hypertension BP slightly elevated Patient on atenolol, losartan, HCTZ, and amlodipine.  Switch atenolol for metoprolol for better rate control.  He is also on diltiazem 60 mg 3 times daily, hopefully with the addition of metoprolol BP improved, fluid removal to help as well  AKI - resolved Creatinine 1.6 on admission, now down to 1. Monitor electrolytes closely   Seizure disorders Asymptomatic, continue Depakote  Chronic wound and decubitus ulcers - POA Wound care consulted -patient recommendations  DVT prophylaxis: Heparin Code Status: Full code Family Communication: Son at bedside Disposition Plan: SNF when medically stable  Consultants:   Cardiology  Procedures:   Echo  Antimicrobials: Anti-infectives (From admission, onward)   Start     Dose/Rate Route Frequency Ordered Stop   04/22/17 2200  azithromycin (ZITHROMAX) 500 mg in sodium chloride 0.9 % 250 mL IVPB     500 mg 250 mL/hr over 60 Minutes Intravenous Every 24 hours 04/22/17 0917 04/25/17 2159   04/22/17 2000  cefTRIAXone (ROCEPHIN) 1 g in sodium chloride 0.9 % 100 mL IVPB     1 g 200 mL/hr over 30 Minutes Intravenous Every 24 hours 04/22/17 0917 04/25/17 1959   04/19/17 0800  vancomycin (VANCOCIN) 1,250 mg  in sodium chloride 0.9 % 250 mL IVPB  Status:  Discontinued     1,250 mg 166.7 mL/hr over  90 Minutes Intravenous Every 24 hours 04/18/17 0738 04/18/17 0741   04/18/17 2200  azithromycin (ZITHROMAX) 500 mg in dextrose 5 % 250 mL IVPB  Status:  Discontinued     500 mg 250 mL/hr over 60 Minutes Intravenous Every 24 hours 04/17/17 2316 04/22/17 0917   04/18/17 2000  cefTRIAXone (ROCEPHIN) 1 g in dextrose 5 % 50 mL IVPB  Status:  Discontinued     1 g 100 mL/hr over 30 Minutes Intravenous Every 24 hours 04/17/17 2316 04/22/17 0918   04/18/17 2000  vancomycin (VANCOCIN) IVPB 750 mg/150 ml premix  Status:  Discontinued     750 mg 150 mL/hr over 60 Minutes Intravenous Every 12 hours 04/18/17 0741 04/21/17 1650   04/18/17 0800  vancomycin (VANCOCIN) 1,750 mg in sodium chloride 0.9 % 500 mL IVPB  Status:  Discontinued     1,750 mg 250 mL/hr over 120 Minutes Intravenous  Once 04/18/17 0738 04/18/17 0741   04/18/17 0800  vancomycin (VANCOCIN) 1,750 mg in sodium chloride 0.9 % 500 mL IVPB     1,750 mg 250 mL/hr over 120 Minutes Intravenous  Once 04/18/17 0741 04/18/17 1033   04/17/17 2130  cefTRIAXone (ROCEPHIN) 1 g in dextrose 5 % 50 mL IVPB     1 g 100 mL/hr over 30 Minutes Intravenous  Once 04/17/17 2127 04/17/17 2215   04/17/17 2130  azithromycin (ZITHROMAX) 500 mg in dextrose 5 % 250 mL IVPB     500 mg 250 mL/hr over 60 Minutes Intravenous  Once 04/17/17 2127 04/17/17 2245          Objective: Vitals:   04/22/17 0734 04/22/17 0858 04/22/17 1202 04/22/17 1436  BP: (!) 168/88  (!) 159/90   Pulse: (!) 112  (!) 113   Resp: (!) 30  (!) 24   Temp: (!) 97.5 F (36.4 C)  (!) 97.5 F (36.4 C)   TempSrc: Axillary  Axillary   SpO2: 95% 95% 97% 99%  Weight:      Height:        Intake/Output Summary (Last 24 hours) at 04/22/2017 1550 Last data filed at 04/22/2017 1408 Gross per 24 hour  Intake 703 ml  Output 3100 ml  Net -2397 ml   Filed Weights   04/20/17 0443 04/21/17 0413 04/22/17 0350  Weight: 86.6 kg (190 lb 14.7 oz) 86.5 kg (190 lb 11.2 oz) 86.1 kg (189 lb 13.1 oz)     Examination:  General exam: Mild respiratory distress, awake HEENT: OP moist and clear Respiratory system: Decreased breath sounds bilaterally, expiratory wheezing, poor respiratory effort Cardiovascular system: Irregularly irregular, S1-S2 positive JVD Gastrointestinal system: Abdomen is nondistended, soft and nontender.  Central nervous system: Oriented to person and place, nonfocal findings Extremities: Bilateral trace pitting edema  Skin: several wounds bilateral lower extremities with dressing intact, refer to wound care consult for description of the decubiti ulcer Psychiatry: Mood & affect appropriate.    Data Reviewed: I have personally reviewed following labs and imaging studies  CBC: Recent Labs  Lab 04/17/17 1954 04/18/17 0532 04/19/17 0313 04/20/17 0214 04/21/17 0356 04/22/17 0247  WBC 19.0* 24.6* 19.2* 15.3* 10.1 12.9*  NEUTROABS 16.6* 22.6*  --   --   --   --   HGB 11.8* 10.6* 10.4* 9.7* 9.6* 10.5*  HCT 38.5* 34.7* 33.7* 31.1* 31.1* 32.7*  MCV 101.6* 102.7* 102.1* 101.0* 100.0 97.3  PLT 142* 151 162 159 163 950   Basic Metabolic Panel: Recent Labs  Lab 04/17/17 1954 04/18/17 0103 04/18/17 0532 04/21/17 0356 04/21/17 0837 04/22/17 0907  NA 146*  --  143 140  --  144  K 3.7  --  3.9 2.4*  --  2.7*  CL 108  --  110 104  --  105  CO2 24  --  18* 23  --  25  GLUCOSE 111*  --  105* 95  --  141*  BUN 28*  --  27* 22*  --  21*  CREATININE 1.50*  --  1.46* 1.04  --  1.02  CALCIUM 8.5*  --  7.4* 7.2*  --  7.9*  MG  --  1.6*  --   --  2.2 2.0   GFR: Estimated Creatinine Clearance: 58.6 mL/min (by C-G formula based on SCr of 1.02 mg/dL). Liver Function Tests: Recent Labs  Lab 04/17/17 1954 04/18/17 0532  AST 103* 77*  ALT 75* 70*  ALKPHOS 75 66  BILITOT 1.2 1.1  PROT 5.4* 4.9*  ALBUMIN 3.4* 2.9*   No results for input(s): LIPASE, AMYLASE in the last 168 hours. Recent Labs  Lab 04/17/17 2015  AMMONIA 37*   Coagulation Profile: Recent Labs   Lab 04/18/17 0103  INR 1.34   Cardiac Enzymes: Recent Labs  Lab 04/18/17 0103 04/18/17 0532 04/18/17 1114  TROPONINI 0.14* 0.35* 0.49*   BNP (last 3 results) No results for input(s): PROBNP in the last 8760 hours. HbA1C: No results for input(s): HGBA1C in the last 72 hours. CBG: Recent Labs  Lab 04/18/17 0001  GLUCAP 91   Lipid Profile: No results for input(s): CHOL, HDL, LDLCALC, TRIG, CHOLHDL, LDLDIRECT in the last 72 hours. Thyroid Function Tests: No results for input(s): TSH, T4TOTAL, FREET4, T3FREE, THYROIDAB in the last 72 hours. Anemia Panel: No results for input(s): VITAMINB12, FOLATE, FERRITIN, TIBC, IRON, RETICCTPCT in the last 72 hours. Sepsis Labs: Recent Labs  Lab 04/17/17 2017 04/18/17 0103 04/18/17 0532  PROCALCITON  --  24.36  --   LATICACIDVEN 2.95* 2.5* 2.6*    Recent Results (from the past 240 hour(s))  Blood Culture (routine x 2)     Status: None   Collection Time: 04/17/17  7:54 PM  Result Value Ref Range Status   Specimen Description BLOOD LEFT ANTECUBITAL  Final   Special Requests   Final    BOTTLES DRAWN AEROBIC AND ANAEROBIC Blood Culture adequate volume   Culture   Final    NO GROWTH 5 DAYS Performed at [facility], [address], [place], [place] [address]    Report Status 04/22/2017 FINAL  Final  Blood Culture (routine x 2)     Status: None   Collection Time: 04/17/17  8:07 PM  Result Value Ref Range Status   Specimen Description BLOOD RIGHT ARM  Final   Special Requests   Final    BOTTLES DRAWN AEROBIC AND ANAEROBIC Blood Culture adequate volume   Culture   Final    NO GROWTH 5 DAYS Performed at [facility], [place] [address], [place], [place] [address]    Report Status 04/22/2017 FINAL  Final  Urine culture     Status: None   Collection Time: 04/17/17  8:14 PM  Result Value Ref Range Status   Specimen Description URINE, CATHETERIZED  Final   Special Requests NONE  Final   Culture   Final    NO  GROWTH Performed  at [facility], [place] [address], [place], [place] [address]    Report Status 04/19/2017 FINAL  Final      Radiology Studies: Dg Chest Port 1 View  Result Date: 04/22/2017 CLINICAL DATA:  Increasing shortness of breath. EXAM: PORTABLE CHEST 1 VIEW COMPARISON:  04/21/2017 and 04/19/2017 FINDINGS: There are persistent moderate bilateral pleural effusions. Heart size is normal. The azygos vein is distended and the pulmonary vascularity is slightly prominent. IMPRESSION: No significant change since the prior study. Moderate bilateral pleural effusions with slight pulmonary vascular congestion. Electronically Signed   By: Lorriane Shire M.D.   On: 04/22/2017 13:36   Dg Chest Port 1 View  Result Date: 04/21/2017 CLINICAL DATA:  Short of breath, chest pain EXAM: PORTABLE CHEST 1 VIEW COMPARISON:  04/19/2017 FINDINGS: Normal cardiac silhouette. Bibasilar effusions again noted. Bibasilar atelectasis present. Upper lungs are clear. No acute osseous abnormality. Posterior cervical fusion. IMPRESSION: No interval change.  Bilateral moderate pleural effusions. Electronically Signed   By: Suzy Bouchard M.D.   On: 04/21/2017 13:48      Scheduled Meds: . amLODipine  5 mg Oral Daily  . aspirin EC  81 mg Oral QHS  . atenolol  25 mg Oral Daily  . diltiazem  60 mg Oral TID  . divalproex  250 mg Oral Daily  . divalproex  750 mg Oral QHS  . docusate sodium  200 mg Oral BID  . ezetimibe  5 mg Oral QHS  . fluticasone  2 spray Each Nare Daily  . furosemide  40 mg Intravenous BID  . gabapentin  300 mg Oral BID  . guaiFENesin  20 mL Oral QID  . levalbuterol  0.63 mg Nebulization TID  . levothyroxine  150 mcg Oral QAC breakfast  . losartan  100 mg Oral Daily  . polyethylene glycol  17 g Oral Daily  . potassium chloride  40 mEq Oral BID  . potassium chloride  40 mEq Oral Once  . pravastatin  40 mg Oral QHS  . predniSONE  50 mg Oral Q breakfast  . QUEtiapine  25 mg Oral QPM  .  zinc sulfate  220 mg Oral q morning - 10a   Continuous Infusions: . azithromycin    . cefTRIAXone (ROCEPHIN)  IV    . heparin 1,200 Units/hr (04/22/17 0600)     LOS: 5 days    Time spent: Total of 35 minutes spent with pt, greater than 50% of which was spent in discussion of  treatment, counseling and coordination of care   Chipper Oman, MD Pager: Text Page via [email or web address]   If 7PM-7AM, please contact night-coverage [email or web address] 04/22/2017, 3:50 PM

## 2017-04-22 NOTE — Evaluation (Signed)
Physical Therapy Evaluation Patient Details Name: [name] MRN: [id number] DOB: 1933-09-29 Today's Date: 04/22/2017   History of Present Illness  84yo male brought to the ED with confusion, found to be in A-fib with RVR by EMS. DIagnosed with sepsis, new A-fib with RVR, acute encephalopathy. PMH chronic R LE wound, HTN, CKD, seizures, memory loss, hypothyroidism, OA, skin CA, hx vertigo, hx back surgery, hx cardiac cath, knee arthoplasty, laminectomy, posterior cervical fusion   Clinical Impression   Patient received in bed with sitter and son present; son provided PLOF/home/equipment details and then left room during PT evaluation. Patient appears confused, A&Ox1 this morning and oriented only to himself, inconsistent following of simple commands this morning even with Mod-Max cues and encouragement. He displays shortness of breath at baseline as well as HR ranging from 100-120BPM at rest in supine. He requires MaxA for supine to sit, and was able to sit at EOB approximately 4 minutes with close min guard; note HR initially ranging from 102-120 sitting EOB, but then increase to 127+BPM, and PT assisted patient with return to bed at this time with MaxA. Nurse tech assisted in repositioning patient in bed. Patient left in bed with sitter present, all needs otherwise met this morning. He will likely benefit from short term rehab in the SNF setting moving forward.     Follow Up Recommendations SNF    Equipment Recommendations  None recommended by PT(defer to next venue )    Recommendations for Other Services       Precautions / Restrictions Precautions Precautions: Fall;Other (comment) Precaution Comments: watch HR/O2  Restrictions Weight Bearing Restrictions: No      Mobility  Bed Mobility Overal bed mobility: Needs Assistance Bed Mobility: Supine to Sit;Sit to Supine     Supine to sit: Max assist Sit to supine: Max assist   General bed mobility comments: MaxA for all  functional mobility, patient inconsistent following simple cues today; able to maintain midline sitting at EOB with HR fluctuation between 102-120 but then began to increase to 127+BPM, returned patient to bed at this point   Transfers                 General transfer comment: DNT due to safety concerns/HR limitations   Ambulation/Gait             General Gait Details: DNT due to safety concerns/HR limitations   Stairs            Wheelchair Mobility    Modified Rankin (Stroke Patients Only)       Balance Overall balance assessment: Needs assistance Sitting-balance support: Feet supported;No upper extremity supported Sitting balance-Leahy Scale: Fair Sitting balance - Comments: close min guard for safety and to maintain midline sitting        Standing balance comment: DNT standing today due to HR limitations                              Pertinent Vitals/Pain Pain Assessment: Faces Pain Score: 0-No pain Faces Pain Scale: No hurt Pain Intervention(s): Limited activity within patient's tolerance;Monitored during session    [place] expects to be discharged to:: Private residence Living Arrangements: Spouse/significant other Available Help at Discharge: Family;Available 24 hours/day Type of Home: House Home Access: Level entry     Home Layout: One level Home Equipment: Walker - 4 wheels;Cane - single point;Bedside commode;Shower seat;Grab bars - tub/shower      Prior  Function Level of Independence: Independent with assistive device(s)         Comments: uses walker typically, family reports shuffling gait pattern at baseline      Hand Dominance        Extremity/Trunk Assessment   Upper Extremity Assessment Upper Extremity Assessment: Defer to OT evaluation    Lower Extremity Assessment Lower Extremity Assessment: Generalized weakness    Cervical / Trunk Assessment Cervical / Trunk Assessment: Kyphotic   Communication   Communication: No difficulties  Cognition Arousal/Alertness: Awake/alert Behavior During Therapy: Flat affect;WFL for tasks assessed/performed Overall Cognitive Status: History of cognitive impairments - at baseline                                 General Comments: history cognitive deficits at baseline, also recent acute encephalopathy that is different from baseline       General Comments General comments (skin integrity, edema, etc.): patient confused, appears A&Ox1; son in room at beginning of session and provided PLOF/equipment background    Exercises     Assessment/Plan    PT Assessment Patient needs continued PT services  PT Problem List Decreased strength;Decreased mobility;Decreased safety awareness;Decreased coordination;Obesity;Decreased balance;Decreased knowledge of use of DME       PT Treatment Interventions DME instruction;Therapeutic activities;Gait training;Therapeutic exercise;Patient/family education;Stair training;Balance training;Functional mobility training;Neuromuscular re-education    PT Goals (Current goals can be found in the Care Plan section)  Acute Rehab PT Goals PT Goal Formulation: Patient unable to participate in goal setting    Frequency Min 2X/week   Barriers to discharge        Co-evaluation               AM-PAC PT "6 Clicks" Daily Activity  Outcome Measure Difficulty turning over in bed (including adjusting bedclothes, sheets and blankets)?: Unable Difficulty moving from lying on back to sitting on the side of the bed? : Unable Difficulty sitting down on and standing up from a chair with arms (e.g., wheelchair, bedside commode, etc,.)?: Unable Help needed moving to and from a bed to chair (including a wheelchair)?: Total Help needed walking in hospital room?: Total Help needed climbing 3-5 steps with a railing? : Total 6 Click Score: 6    End of Session Equipment Utilized During Treatment:  Oxygen Activity Tolerance: Patient tolerated treatment well Patient left: in bed;with nursing/sitter in room;with call bell/phone within reach;with restraints reapplied   PT Visit Diagnosis: Unsteadiness on feet (R26.81);Muscle weakness (generalized) (M62.81);Difficulty in walking, not elsewhere classified (R26.2);Other abnormalities of gait and mobility (R26.89)    Time: 2671-2458 PT Time Calculation (min) (ACUTE ONLY): 15 min   Charges:   PT Evaluation $PT Eval High Complexity: 1 High     PT G Codes:        Deniece Ree PT, DPT, CBIS  Supplemental Physical Therapist [place]   Pager [phone]

## 2017-04-22 NOTE — Care Management Important Message (Signed)
Important Message  Patient Details  Name: [name] MRN: [id number] Date of Birth: [birth year]   Medicare Important Message Given:  Yes    [name] 04/22/2017, 1:17 PM

## 2017-04-22 NOTE — Progress Notes (Signed)
MD notified of critical lab K+ 2.7, and had 9beat run of VT this morning.

## 2017-04-22 NOTE — Consult Note (Addendum)
WOC consult requested for wounds.  This was already performed on 2/7; please refer to previous consult note for assessment and measurements.  Topical treatment orders have been provided for bedside nurses to perform. Pt can follow-up with the outpatient wound care center after discharge. Please re-consult if further assistance is needed.  Thank-you,  Julien Girt MSN, [place], [place], [place], [place]

## 2017-04-22 NOTE — Consult Note (Signed)
Admit date: 04/17/2017 Referring Physician  Patrecia Pour, MD Primary Physician  [facility] Primary Cardiologist  None Reason for Consultation  Atrial Fibrillation  HPI: [name] is a 84 y.o. male with history of mild short-term memory loss, seizure disorder, chronic leg and decubitus wounds followed in wound clinic, HTN, HLD, polyarthritis on chronic prednisone, hypothyroidism, CKD-III and CAD with stent placement in 2012 per wife who is being seen today for the evaluation of AFib with RVR at the request of the hospitalist service.   Mr. Pound was admitted 2/6 with Acute Encephalopathy secondary to sepsis in setting of PNA/chronic leg wounds and was found to be in new Atrial Fibrillation with RVR on admission. Sepsis treated with IVF and 5 days of Ceftriaxone and Azithromycin with resolution of fever and marked improvement of leukocytosis. His AFib with RVR has persisted and cardiology has been asked to evaluate.   He has remained in atrial fibrillation throughout admission with rates between 90-130. His home blood pressure medications were held on admission however Atenolol 25mg  was resumed and he was started on PO diltiazem 60mg  TID yesterday with improved rates (80's-100's).   Hospital course has been complicated by hypoxia and worsened dyspnea.   CXRs have demonstrated pulmonary vascular congestion with moderate BL pleural effusions. ECHO obtained 04/18/17 with LVEF 60-65% and moderate LVH without regional wall motion abnormalities or significant valvular disease. IVC was mildly dilated and respirophasic changes were blunted. He has been given 2 days of IV Lasix with good UOP but still with SOB.  He's experienced intermittent delirium throughout hospitalization with occasional medication refusal, now with 1:1 sitter for patient saftey.    PMH:   Past Medical History:  Diagnosis Date  . Abnormality of gait 09/22/2015  . Arthritis   . Bruises easily   . Cancer ([place])    skin CA removed  from back  . Chronic insomnia 03/30/2015  . Complication of anesthesia    trouble waking up  . Constipation    from medications taken  . GERD (gastroesophageal reflux disease)   . Hypercholesteremia   . Hypertension   . Hypothyroidism   . Memory difficulty 09/22/2015  . Osteoarthritis   . Transient alteration of awareness 03/30/2015  . Vertigo    hx of     PSH:   Past Surgical History:  Procedure Laterality Date  . BACK SURGERY    . CARDIAC CATHETERIZATION     Stent X 1  . CERVIX SURGERY    . CORONARY ANGIOPLASTY    . EYE SURGERY     Bilateral Cataract surgery   . HERNIA REPAIR    . I&D EXTREMITY Right 05/10/2015   Procedure: IRRIGATION AND DEBRIDEMENT EXTREMITY;  Surgeon: Leanora Cover, MD;  Location: [facility];  Service: Orthopedics;  Laterality: Right;  . KNEE ARTHROPLASTY     right knee X 2; left knee once  . LAMINECTOMY     X 6  . POSTERIOR CERVICAL FUSION/FORAMINOTOMY  01/28/2012   Procedure: POSTERIOR CERVICAL FUSION/FORAMINOTOMY LEVEL 3;  Surgeon: [name], MD;  Location: [facility];  Service: Neurosurgery;  Laterality: Left;  Posterior Cervical Five-Thoracic One Fusion, Arthrodesis with LEFT Cervical Seven-thoracic One Laminectomy, Foraminotomy and Resection of Synovial Cyst  . POSTERIOR CERVICAL FUSION/FORAMINOTOMY N/A 01/29/2013   Procedure: POSTERIOR CERVICAL FUSION/FORAMINOTOMY LEVEL 1 and C2-5 Posteriolateral Arthrodesis;  Surgeon: [name], MD;  Location: [facility];  Service: Neurosurgery;  Laterality: N/A;  C2-C3 Laminectomy C2-C3 posterior cervical arthrodesis  .  TONSILLECTOMY      Allergies:  Demerol [meperidine] and Keppra [levetiracetam] Prior to Admit Meds:   Medications Prior to Admission  Medication Sig Dispense Refill Last Dose  . alendronate (FOSAMAX) 70 MG tablet Take 70 mg by mouth every Sunday.    04/14/2017 at Unknown time  . amLODipine (NORVASC) 5 MG tablet Take 5 mg by mouth Daily.    04/17/2017 at am  . aspirin  EC 81 MG tablet Take 81 mg by mouth at bedtime.   04/17/2017 at 1800  . atenolol (TENORMIN) 25 MG tablet Take 25 mg by mouth daily.   04/17/2017 at am  . calcium carbonate (OS-CAL) 600 MG TABS tablet Take 600 mg by mouth 2 (two) times daily with a meal.   04/17/2017 at pm  . Cholecalciferol (VITAMIN D-3) 1000 units CAPS Take 1,000 Units by mouth 2 (two) times daily.   04/17/2017 at Unknown time  . Coenzyme Q10 (COQ10) 200 MG CAPS Take 200 mg by mouth at bedtime.   04/17/2017 at pm  . cyanocobalamin 500 MCG tablet Take 500 mcg by mouth at bedtime.   04/17/2017 at pm  . divalproex (DEPAKOTE) 250 MG DR tablet Take 3 tablets (750 mg total) by mouth every 12 (twelve) hours. (Patient taking differently: Take 250-750 mg by mouth See admin instructions. 250 mg by mouth in the morning and 750 mg at bedtime) 60 tablet 0 04/17/2017 at pm  . docusate sodium (COLACE) 100 MG capsule Take 200 mg by mouth 2 (two) times daily.    04/17/2017 at pm  . gabapentin (NEURONTIN) 300 MG capsule Take 300 mg by mouth 2 (two) times daily.   04/17/2017 at pm  . hydrochlorothiazide (HYDRODIURIL) 25 MG tablet Take 25 mg by mouth daily.   04/17/2017 at Unknown time  . HYDROcodone-acetaminophen (NORCO) 10-325 MG tablet Take 1 tablet by mouth See admin instructions. 1 tablet by mouth every four to six hours   04/17/2017 at pm  . levothyroxine (SYNTHROID, LEVOTHROID) 150 MCG tablet Take 150 mcg by mouth daily before breakfast.   04/17/2017 at am  . losartan (COZAAR) 100 MG tablet Take 1 tablet (100 mg total) by mouth daily. 30 tablet 0 04/17/2017 at Unknown time  . Multiple Vitamin (MULTIVITAMIN WITH MINERALS) TABS tablet Take 1 tablet by mouth at bedtime.   04/17/2017 at Unknown time  . nitroGLYCERIN (NITROSTAT) 0.4 MG SL tablet Place 0.4 mg under the tongue every 5 (five) minutes as needed for chest pain. Reported on 07/20/2015   Unk at Unk  . omeprazole (PRILOSEC) 20 MG capsule Take 20 mg by mouth Daily.   04/17/2017 at Unknown time  . OVER THE COUNTER  MEDICATION KerraCell: Use for packing/dressing changes on right leg   04/17/2017 at Unknown time  . OVER THE COUNTER MEDICATION ColActive Plus Collagen Matrix Dressing(s): Use for daily wound care on affected areas   04/17/2017 at Unknown time  . pravastatin (PRAVACHOL) 40 MG tablet Take 40 mg by mouth at bedtime.   2 04/17/2017 at pm  . predniSONE (DELTASONE) 10 MG tablet Take 10 mg by mouth Daily.    04/17/2017 at am  . senna (SENOKOT) 8.6 MG tablet Take 1 tablet by mouth 2 (two) times daily.    04/17/2017 at pm  . sodium chloride 0.9 % nebulizer solution 3 ml's in conjunction with daily wound care maintenance    04/17/2017 at Unknown time  . sulindac (CLINORIL) 200 MG tablet Take 200 mg by mouth 2 (two) times daily.  04/17/2017 at pm  . vitamin A 8000 UNIT capsule Take 8,000 Units by mouth 2 (two) times daily.   04/17/2017 at Unknown time  . vitamin C (ASCORBIC ACID) 500 MG tablet Take 500 mg by mouth every morning.   04/17/2017 at Unknown time  . ZETIA 10 MG tablet Take 5 mg by mouth at bedtime.    04/17/2017 at pm  . zinc gluconate 50 MG tablet Take 50 mg by mouth every morning.   04/17/2017 at Unknown time   Fam HX:    Family History  Problem Relation Age of Onset  . Hypertension Mother   . Cancer Mother   . Kidney failure Father   . Heart disease Father    Social HX:    Retired Software engineer (owned own pharmacy) Married with 3 children (2 sons) Lives at home with wife [name] Former smoker  ROS:  All  ROS were addressed and are negative except what is stated in the HPI  Physical Exam: Blood pressure (!) 159/90, pulse (!) 113, temperature (!) 97.5 F (36.4 C), temperature source Axillary, resp. rate (!) 24, height 5\' 10"  (1.778 m), weight 189 lb 13.1 oz (86.1 kg), SpO2 97 %.  General: Elderly caucasian male resting in bed without distress. Lethargic. Soft restraints and 1:1 sitter in place. Wife [name] at bedside with many questions.  Head: PERRLA.  Normal cephalic and atramatic  Lungs: Appreciable  wheezes without stethoscope. Diminished breath sounds lower lobes BL. Slight accessory muscle use. Exam limited due to poor patient effort. Heart: IRIR, rate 90-100. +JVD  Abdomen: Obese abdomen but soft and non-tender. +bs.  Extremities:  Warm and perfused. Pitting edema R foot. Several bandaged wounds BL LE but no foul odor.  Neuro: Does not consistently respond to questions appropriately Psych:  Good affect. Visual hallucinations  Labs:   Lab Results  Component Value Date   WBC 12.9 (H) 04/22/2017   HGB 10.5 (L) 04/22/2017   HCT 32.7 (L) 04/22/2017   MCV 97.3 04/22/2017   PLT 227 04/22/2017    Recent Labs  Lab 04/18/17 0532  04/22/17 0907  NA 143   < > 144  K 3.9   < > 2.7*  CL 110   < > 105  CO2 18*   < > 25  BUN 27*   < > 21*  CREATININE 1.46*   < > 1.02  CALCIUM 7.4*   < > 7.9*  PROT 4.9*  --   --   BILITOT 1.1  --   --   ALKPHOS 66  --   --   ALT 70*  --   --   AST 77*  --   --   GLUCOSE 105*   < > 141*   < > = values in this interval not displayed.   No results found for: PTT Lab Results  Component Value Date   INR 1.34 04/18/2017   INR 1.06 07/06/2015   INR 1.15 02/02/2015   Lab Results  Component Value Date   CKTOTAL 275 07/06/2015   CKMB 3.5 09/17/2007   TROPONINI 0.49 (HH) 04/18/2017    No results found for: CHOL No results found for: HDL No results found for: LDLCALC No results found for: TRIG No results found for: CHOLHDL No results found for: LDLDIRECT    Radiology:  Dg Chest Port 1 View  Result Date: 04/21/2017 CLINICAL DATA:  Short of breath, chest pain EXAM: PORTABLE CHEST 1 VIEW COMPARISON:  04/19/2017 FINDINGS: Normal cardiac silhouette. Bibasilar  effusions again noted. Bibasilar atelectasis present. Upper lungs are clear. No acute osseous abnormality. Posterior cervical fusion. IMPRESSION: No interval change.  Bilateral moderate pleural effusions. Electronically Signed   By: Suzy Bouchard M.D.   On: 04/21/2017 13:48   Telemetry      Atrial fibrillation with rates 80's-100 since 11 am. Occasional PVC - Personally Reviewed  ECG    EKG for today pending- Personally Reviewed   ASSESSMENT: 84 y/o M admitted 04/17/17 with AMS from sepsis (secondary to PNA/chronic leg wounds) who was found to be in AFib with RVR on admission. He received IVF and 5 days of antibiotics and we are currently working on rate control and volume removal.   PLAN:    Atrial Fibrillation: On Heparin. Rate better controlled since addition of PO Diltiazem and resuming his home Atenolol 25mg  daily. CHADs2Vasc score 4 with annual stroke risk 4.8-6.7% and HASBLED score 2 with major risk of bleeding up to 4.1%. Per chart, patient, son and wife declined full anticoagulation at discharge however would like to continue Heparin while in hospital. His wife seems to be agreeable today. Suspect Sepsis as acute trigger.  -Continue Heparin, will likely transition to PO Eliquis  -Continue home Atenolol 25mg  -Continue Cardizem 60mg  TID  Diastolic Heart Failure: Received several liters IVF appropriately per sepsis protocol and has been experiencing SOB for several days. CXR w/ cardiomegaly, pulmonary vascular congestion and BL pleural effusions. ECHO this admission with preserved EF but did show moderate LVH and moderate-severely dilated atria. In addition, IVC was dilated with blunted respirophasic changes. He received IV Lasix 20mg  2/9 and IV Lasix 40mg  2/10.  -Try to limit excess fluids via IV, transition to PO meds if able -IV Lasix 40mg  for 2 doses  Hypokalemia: K 2.4 yesterday, 2.7 today. 86mEq IV replacement ordered today by primary and have added an additional 40mEq PO + 31mEq PO BID. Magnesium was 2 today, was 1.6 several days ago. Will need to continue monitoring K and Mag as patient requires more diuresis.  -PO K-dur 10mEq x1 + PO 68mEq BID -Monitor lytes, mag, replace as indicated -Holding HCTZ  Troponin Elevation: To 0.48 on admission. Suspect related to  demand from tachycardia in patient with known CAD. He has denied any chest pain.   Acute Respiratory Failure with Hypoxia: SpO2 80% 04/20/17 AM and required 5L via [place]. Now on 1L via [place]. Has known diagnosis of OSA but refuses CPAP historically. Suspect related to pulmonary edema/congestion, will monitor for response to lasix.  HTN: On Amlodipine 5mg , HCTZ 25mg , Losartan 100mg  and Atenolol 25mg  at home. Atenolol and Losartan were resumed yesterday and Diltiazem 60mg  TID was started however patient remains hypertensive to 150-180/80-90. Suspect some pressure improvement with volume removal however would aim for tighter BP control -Resume Amlodipine -Continue Atenolol 25mg . Could consider transition to Metoprolol 50mg  BID should pressures remain high and/or rates become uncontrolled -Monitor renal function and BP  AKI: Cr 1.6 on admission, now 1. Monitor this and lytes closely with resuming medications and diuresis  Sepsis, CAP, Wounds: Per primary. Seems to have completed 5 day course of antibiotics.   Iron Deficiency Anemia: Iron 12 with iron sat 5. Per primary.  Seizure Disorder: Seizure activity controlled with Depakote 250mg  QAM and 750mg  QPM. No seizure-like activity has been recorded so far during admission.   Hypothyroidism: TSH appropriate on home dose of Synthroid 150 mcg daily.   Disposition: PT recommends SNF. Working on rate control, improving breathing and mental status prior to discharge.  For questions or updates, please contact [place] Please consult [email or web address] for contact info under Cardiology/STEMI.  Ashayla Subia, DO  04/22/2017  12:49 PM

## 2017-04-23 ENCOUNTER — Inpatient Hospital Stay (HOSPITAL_COMMUNITY): Payer: Medicare Other

## 2017-04-23 DIAGNOSIS — J181 Lobar pneumonia, unspecified organism: Secondary | ICD-10-CM

## 2017-04-23 DIAGNOSIS — A419 Sepsis, unspecified organism: Principal | ICD-10-CM

## 2017-04-23 DIAGNOSIS — N183 Chronic kidney disease, stage 3 (moderate): Secondary | ICD-10-CM

## 2017-04-23 DIAGNOSIS — N1831 Chronic kidney disease, stage 3a: Secondary | ICD-10-CM

## 2017-04-23 LAB — BASIC METABOLIC PANEL
Anion gap: 13 (ref 5–15)
BUN: 23 mg/dL — ABNORMAL HIGH (ref 6–20)
CO2: 27 mmol/L (ref 22–32)
Calcium: 8 mg/dL — ABNORMAL LOW (ref 8.9–10.3)
Chloride: 103 mmol/L (ref 101–111)
Creatinine, Ser: 1.13 mg/dL (ref 0.61–1.24)
GFR calc Af Amer: >60 mL/min (ref >60)
GFR calc non Af Amer: 59 mL/min — ABNORMAL LOW (ref >60)
Glucose, Bld: 141 mg/dL — ABNORMAL HIGH (ref 65–99)
Potassium: 3.9 mmol/L (ref 3.5–5.1)
Sodium: 143 mmol/L (ref 135–145)

## 2017-04-23 LAB — CBC
HCT: 32.3 % — ABNORMAL LOW (ref 39.0–52.0)
Hemoglobin: 9.9 g/dL — ABNORMAL LOW (ref 13.0–17.0)
MCH: 30.9 pg (ref 26.0–34.0)
MCHC: 30.7 g/dL (ref 30.0–36.0)
MCV: 100.9 fL — ABNORMAL HIGH (ref 78.0–100.0)
Platelets: 225 10*3/uL (ref 150–400)
RBC: 3.2 MIL/uL — ABNORMAL LOW (ref 4.22–5.81)
RDW: 15.1 % (ref 11.5–15.5)
WBC: 8.9 10*3/uL (ref 4.0–10.5)

## 2017-04-23 LAB — PROCALCITONIN: Procalcitonin: 1.34 ng/mL

## 2017-04-23 LAB — HEPARIN LEVEL (UNFRACTIONATED)
Heparin Unfractionated: <0.1 IU/mL — ABNORMAL LOW (ref 0.30–0.70)
Heparin Unfractionated: <0.1 IU/mL — ABNORMAL LOW (ref 0.30–0.70)

## 2017-04-23 LAB — MAGNESIUM: Magnesium: 1.9 mg/dL (ref 1.7–2.4)

## 2017-04-23 MED ORDER — APIXABAN 5 MG PO TABS
5.0000 mg | ORAL_TABLET | Freq: Two times a day (BID) | ORAL | Status: DC
  Administered 2017-04-23 – 2017-04-25 (×5): 5 mg via ORAL
  Filled 2017-04-23 (×5): qty 1

## 2017-04-23 MED ORDER — DOXAZOSIN MESYLATE 2 MG PO TABS
2.0000 mg | ORAL_TABLET | Freq: Every day | ORAL | Status: DC
  Administered 2017-04-23: 2 mg via ORAL
  Filled 2017-04-23: qty 1

## 2017-04-23 MED ORDER — FUROSEMIDE 10 MG/ML IJ SOLN: 40.0000 mg | Freq: Two times a day (BID) | INTRAMUSCULAR | Status: DC

## 2017-04-23 MED ORDER — PREDNISONE 20 MG PO TABS
40.0000 mg | ORAL_TABLET | Freq: Every day | ORAL | Status: DC
  Administered 2017-04-24 (×2): 40 mg via ORAL
  Filled 2017-04-23: qty 2

## 2017-04-23 MED ORDER — METOPROLOL TARTRATE 25 MG PO TABS
25.0000 mg | ORAL_TABLET | Freq: Once | ORAL | Status: AC
  Administered 2017-04-23: 25 mg via ORAL
  Filled 2017-04-23: qty 1

## 2017-04-23 MED ORDER — DILTIAZEM HCL ER COATED BEADS 180 MG PO CP24
180.0000 mg | ORAL_CAPSULE | Freq: Every day | ORAL | Status: DC
  Administered 2017-04-23 – 2017-04-24 (×2): 180 mg via ORAL
  Filled 2017-04-23: qty 1

## 2017-04-23 MED ORDER — HEPARIN BOLUS VIA INFUSION
3000.0000 [IU] | Freq: Once | INTRAVENOUS | Status: AC
  Administered 2017-04-23: 3000 [IU] via INTRAVENOUS
  Filled 2017-04-23: qty 3000

## 2017-04-23 MED ORDER — LEVALBUTEROL HCL 0.63 MG/3ML IN NEBU: 0.6300 mg | INHALATION_SOLUTION | Freq: Four times a day (QID) | RESPIRATORY_TRACT | Status: DC | PRN

## 2017-04-23 MED ORDER — METOPROLOL TARTRATE 50 MG PO TABS
50.0000 mg | ORAL_TABLET | Freq: Two times a day (BID) | ORAL | Status: DC
  Administered 2017-04-23 – 2017-04-25 (×4): 50 mg via ORAL
  Filled 2017-04-23 (×4): qty 1

## 2017-04-23 MED ORDER — FUROSEMIDE 40 MG PO TABS
40.0000 mg | ORAL_TABLET | Freq: Every day | ORAL | Status: DC
  Administered 2017-04-24 – 2017-04-25 (×2): 40 mg via ORAL
  Filled 2017-04-23 (×2): qty 1

## 2017-04-23 MED ORDER — FUROSEMIDE 10 MG/ML IJ SOLN
40.0000 mg | Freq: Two times a day (BID) | INTRAMUSCULAR | Status: AC
  Administered 2017-04-23: 40 mg via INTRAVENOUS
  Filled 2017-04-23: qty 4

## 2017-04-23 NOTE — Progress Notes (Signed)
ANTICOAGULATION CONSULT NOTE - Follow Up Consult  Pharmacy Consult for Heparin > apixaban Indication: atrial fibrillation  Allergies  Allergen Reactions  . Demerol [Meperidine] Nausea And Vomiting  . Keppra [Levetiracetam] Other (See Comments)    Causes sleepiness    Patient Measurements: Height: 5\' 10"  (177.8 cm) Weight: 182 lb 1.6 oz (82.6 kg) IBW/kg (Calculated) : 73 Heparin Dosing Weight: 87 kg  Vital Signs: Temp: 97.6 F (36.4 C) (02/12 0422) Temp Source: Axillary (02/12 0422) BP: 175/94 (02/12 0730) Pulse Rate: 96 (02/12 0917)  Labs: Recent Labs    04/21/17 0356 04/22/17 0247 04/22/17 0907 04/22/17 1519 04/23/17 0243 04/23/17 0520  HGB 9.6* 10.5*  --   --  9.9*  --   HCT 31.1* 32.7*  --   --  32.3*  --   PLT 163 227  --   --  225  --   HEPARINUNFRC 0.44 0.46  --   --  <0.10* <0.10*  CREATININE 1.04  --  1.02 1.06 1.13  --     Estimated Creatinine Clearance: 52.9 mL/min (by C-G formula based on SCr of 1.13 mg/dL).  Assessment: 84 yr old male continues on IV heparin for atrial fibrillation. Not on anticoagulation PTA. Pharmacy consulted to transition to apixaban. CBC stable. No bleed documented  Goal of Therapy:  Stroke prevention Monitor platelets by anticoagulation protocol: Yes    Plan:  D/c heparin drip at time of 1st dose of apixaban 5mg  PO BID - communicated plan with RN Monitor CBC, s/sx bleeding  [name], PharmD, BCPS Clinical Pharmacist Clinical phone for 04/23/2017 until 3:30pm: [phone] If after 3:30pm, please call main pharmacy at: [phone] 04/23/2017 11:12 AM

## 2017-04-23 NOTE — Discharge Instructions (Signed)

## 2017-04-23 NOTE — Progress Notes (Signed)
Progress Note  Patient Name: [name] Date of Encounter: 04/23/2017  Primary Cardiologist: Skeet Latch, MD   Subjective   Pt has no complaints, but perseveration of speech. No family at bedside.  Inpatient Medications    Scheduled Meds: . amLODipine  5 mg Oral Daily  . diltiazem  60 mg Oral TID  . divalproex  250 mg Oral Daily  . divalproex  750 mg Oral QHS  . docusate sodium  200 mg Oral BID  . ezetimibe  5 mg Oral QHS  . fluticasone  2 spray Each Nare Daily  . gabapentin  300 mg Oral BID  . levalbuterol  0.63 mg Nebulization TID  . levothyroxine  150 mcg Oral QAC breakfast  . losartan  100 mg Oral Daily  . metoprolol tartrate  50 mg Oral BID  . polyethylene glycol  17 g Oral Daily  . pravastatin  40 mg Oral QHS  . predniSONE  50 mg Oral Q breakfast  . QUEtiapine  25 mg Oral QPM  . zinc sulfate  220 mg Oral q morning - 10a   Continuous Infusions: . cefTRIAXone (ROCEPHIN)  IV Stopped (04/22/17 2238)  . heparin 1,500 Units/hr (04/23/17 0821)   PRN Meds: acetaminophen **OR** acetaminophen, hydrALAZINE, HYDROcodone-acetaminophen, nitroGLYCERIN, ondansetron **OR** ondansetron (ZOFRAN) IV   Vital Signs    Vitals:   04/23/17 0422 04/23/17 0730 04/23/17 0824 04/23/17 0917  BP: (!) 142/86 (!) 175/94    Pulse: 90 94 93 96  Resp: 13 (!) 21    Temp: 97.6 F (36.4 C)     TempSrc: Axillary     SpO2: 99% 99%    Weight: 182 lb 1.6 oz (82.6 kg)     Height:        Intake/Output Summary (Last 24 hours) at 04/23/2017 1016 Last data filed at 04/23/2017 0754 Gross per 24 hour  Intake 1191 ml  Output 1800 ml  Net -609 ml   Filed Weights   04/21/17 0413 04/22/17 0350 04/23/17 0422  Weight: 190 lb 11.2 oz (86.5 kg) 189 lb 13.1 oz (86.1 kg) 182 lb 1.6 oz (82.6 kg)    Telemetry    Afib in the 80s - Personally Reviewed  ECG    No new tracings - Personally Reviewed  Physical Exam   GEN: No acute distress.   Neck: No JVD Cardiac: Irregular rhythm, regular  rate, no murmurs, rubs, or gallops.  Respiratory: Clear to auscultation bilaterally in upper lobes, diminished in bases GI: Soft, nontender, non-distended  MS: trace edema; No deformity. Neuro:  Nonfocal  Psych: Normal affect   Labs    Chemistry Recent Labs  Lab 04/17/17 1954 04/18/17 0532  04/22/17 0907 04/22/17 1519 04/23/17 0243  NA 146* 143   < > 144 143 143  K 3.7 3.9   < > 2.7* 3.2* 3.9  CL 108 110   < > 105 103 103  CO2 24 18*   < > 25 26 27   GLUCOSE 111* 105*   < > 141* 106* 141*  BUN 28* 27*   < > 21* 22* 23*  CREATININE 1.50* 1.46*   < > 1.02 1.06 1.13  CALCIUM 8.5* 7.4*   < > 7.9* 7.7* 8.0*  PROT 5.4* 4.9*  --   --   --   --   ALBUMIN 3.4* 2.9*  --   --   --   --   AST 103* 77*  --   --   --   --  ALT 75* 70*  --   --   --   --   ALKPHOS 75 66  --   --   --   --   BILITOT 1.2 1.1  --   --   --   --   GFRNONAA 42* 43*   < > >60 >60 59*  GFRAA 49* 50*   < > >60 >60 >60  ANIONGAP 14 15   < > 14 14 13    < > = values in this interval not displayed.     Hematology Recent Labs  Lab 04/21/17 0356 04/22/17 0247 04/23/17 0243  WBC 10.1 12.9* 8.9  RBC 3.11* 3.36* 3.20*  HGB 9.6* 10.5* 9.9*  HCT 31.1* 32.7* 32.3*  MCV 100.0 97.3 100.9*  MCH 30.9 31.3 30.9  MCHC 30.9 32.1 30.7  RDW 14.7 14.4 15.1  PLT 163 227 225    Cardiac Enzymes Recent Labs  Lab 04/18/17 0103 04/18/17 0532 04/18/17 1114  TROPONINI 0.14* 0.35* 0.49*   No results for input(s): TROPIPOC in the last 168 hours.   BNPNo results for input(s): BNP, PROBNP in the last 168 hours.   DDimer No results for input(s): DDIMER in the last 168 hours.   Radiology    Dg Chest Port 1 View  Result Date: 04/22/2017 CLINICAL DATA:  Increasing shortness of breath. EXAM: PORTABLE CHEST 1 VIEW COMPARISON:  04/21/2017 and 04/19/2017 FINDINGS: There are persistent moderate bilateral pleural effusions. Heart size is normal. The azygos vein is distended and the pulmonary vascularity is slightly prominent.  IMPRESSION: No significant change since the prior study. Moderate bilateral pleural effusions with slight pulmonary vascular congestion. Electronically Signed   By: Lorriane Shire M.D.   On: 04/22/2017 13:36   Dg Chest Port 1 View  Result Date: 04/21/2017 CLINICAL DATA:  Short of breath, chest pain EXAM: PORTABLE CHEST 1 VIEW COMPARISON:  04/19/2017 FINDINGS: Normal cardiac silhouette. Bibasilar effusions again noted. Bibasilar atelectasis present. Upper lungs are clear. No acute osseous abnormality. Posterior cervical fusion. IMPRESSION: No interval change.  Bilateral moderate pleural effusions. Electronically Signed   By: Suzy Bouchard M.D.   On: 04/21/2017 13:48    Cardiac Studies   Echo 04/18/17: Study Conclusions - Left ventricle: The cavity size was normal. Wall thickness was   increased in a pattern of moderate LVH. Systolic function was   normal. The estimated ejection fraction was in the range of 60%   to 65%  Wall motion was normal; there were no regional wall  motion abnormalities. - Mitral valve: There was mild regurgitation. - Left atrium: The atrium was moderately to severely dilated. - Right atrium: The atrium was moderately dilated. - Pulmonary arteries: Systolic pressure was mildly to moderately increased. PA peak pressure: 37 mm Hg (S).  Patient Profile     84 y.o. male with history of mild short-term memory loss, seizure disorder, chronic leg and decubitus wounds followed in wound clinic, HTN, HLD, polyarthritis on chronic prednisone, hypothyroidism, CKD-III and CAD with stent placement in 2012 per wife who is being seen for AFib with RVR.   Assessment & Plan    1. Atrial fibrillation RVR in the setting of sepsis.  Pt continues in afib, better rate-controlled today in the 80s. He is asymptomatic, but likely confused. Transition cardizem to 180 mg cardizem CD. Continue lopressor 50 mg BID.  Heparin drip running, but not therapeutic. Recommend switching to eliquis 5 mg BID  if no procedures planned. Monitor weight and creatinine outpatient - may  eventually need to transition to lower dose of eliquis if his weight decreases. - This patients CHA2DS2-VASc Score and unadjusted Ischemic Stroke Rate (% per year) is equal to 4.8 % stroke rate/year from a score of 4 (HTN, CHF, age).   2. Acute on chronic diastolic heart failure Has been diuresed with IV lasix in the setting of fluid resuscitation for sepsis. He is overall net negative 300 cc with 1.8 L urine output urine output yesterday. Pressure has been labile. Resume HCTZ. Pt appears closer to euvolemic today.   3. HTN Pressure has been labile. Cardizem as above. Restart HCTZ today.   For questions or updates, please contact [place] Please consult [email or web address] for contact info under Cardiology/STEMI.      Signed, Tami Lin Duke, PA  04/23/2017, 10:16 AM

## 2017-04-23 NOTE — Progress Notes (Signed)
PROGRESS NOTE [facility]   [name]   DSK:[id number] DOB: 1933-12-04  DOA: 04/17/2017 PCP: [name], MD   Brief Narrative:  [name] is a 84 year old male with medical history significant for seizure disorders, chronic leg and decubitus wound, hypertension, hyperlipidemia, polyarthritis on prednisone, hypothyroidism, CKD stage III and CAD who presented to the emergency department with confusion and found to be on A. fib with RVR and febrile by EMS.  Upon ED evaluation he was on A. fib with mild hypotension and elevated lactic acid.  Chest x-ray showed infiltrate concerning of pneumonia and patient was admitted for working diagnosis of acute encephalopathy, sepsis from pneumonia and new onset A. fib with [facility] course has been complicated with delirium, hypoxia with worsening dyspnea chest x-ray shows increasing vascular congestion with moderate bilateral pleural effusions, echocardiogram obtain on 04/18/17 showed LVEF of 60-65%.  Cardiology has been consulted  Subjective: Patient seen and examined, report feeling significantly better.  Wants to go home.  Breathing much improved, reports good urine output.  Denies chest pain.  Overall feeling weak.  Assessment & Plan: Acute metabolic encephalopathy due to sepsis and hypoxia - slowly improving Treat underlying causes Improved  Sepsis secondary to pneumonia and chronic leg wounds Influenza PCR negative, initially treated with Zosyn receive 4 days, now patient being treated with Zithromax and Rocephin.  Okay to stop Zithromax, will check pro-calcitonin levels if negative will discontinue Rocephin as well, if positive will complete treatment for total of 10 days. Blood cultures and urine cultures so far negative, WBCs trending down.  Continue prednisone taper currently at 50 mg, tapered to 40 in a.m.  A. fib with RVR - improved Likely triggered by infectious process, patient being treated with Cardizem 30 mg 3  times, rates much improved after adding metoprolol, patient remains on A. fib.  Increase metoprolol to 50 twice daily, continue Cardizem 30 mg 3 times daily.  Cardiology discussed anticoagulation with the family and they would like to proceed with Eliquis.  Continue to monitor  Acute respiratory failure due to pneumonia and fluid overload Patient on oxygen supplementation, treat underlying conditions. Wean oxygen as tolerated History of OSA, has refused CPAP in the past  Acute on chronic diastolic heart failure Due to aggressive fluid resuscitation for sepsis Receive IV Lasix 40 mg x2, good urine output and significant improvement on breathing, Repeated checks x-ray 2/12 shows persistent vascular congestion and pleural effusion Will continue IV Lasix 40mg  IV BID  Echo showed preserved ejection fraction with moderate LVH and moderate/severe dilated right atrium.  Consistent with diastolic dysfunction Elevated troponin suspect demand ischemia from A. fib and infectious process Monitor creatinine closely  Hypertension BP remains elevated Patient on atenolol, losartan, HCTZ, and amlodipine.  Was switched from atenolol to metoprolol, will increase metoprolol dose to 50 mg twice daily.  He is also on diltiazem 60 mg 3 times daily To need to monitor BP closely  AKI - resolved Creatinine 1.6 on admission Creatinine stable Monitor closely as patient is on aggressive diuresis  Seizure disorders Asymptomatic, continue Depakote  Chronic wound and decubitus ulcers - POA Wound care consulted -patient recommendations  DVT prophylaxis: Heparin Code Status: Full code Family Communication: Wife at bedside Disposition Plan: SNF in 2-3 days  Consultants:   Cardiology  Procedures:   Echo  Antimicrobials: Anti-infectives (From admission, onward)   Start     Dose/Rate Route Frequency Ordered Stop   04/22/17 2200  azithromycin (ZITHROMAX) 500 mg in sodium chloride  0.9 % 250 mL IVPB     500  mg 250 mL/hr over 60 Minutes Intravenous Every 24 hours 04/22/17 0917 04/25/17 2159   04/22/17 2000  cefTRIAXone (ROCEPHIN) 1 g in sodium chloride 0.9 % 100 mL IVPB     1 g 200 mL/hr over 30 Minutes Intravenous Every 24 hours 04/22/17 0917 04/25/17 1959   04/19/17 0800  vancomycin (VANCOCIN) 1,250 mg in sodium chloride 0.9 % 250 mL IVPB  Status:  Discontinued     1,250 mg 166.7 mL/hr over 90 Minutes Intravenous Every 24 hours 04/18/17 0738 04/18/17 0741   04/18/17 2200  azithromycin (ZITHROMAX) 500 mg in dextrose 5 % 250 mL IVPB  Status:  Discontinued     500 mg 250 mL/hr over 60 Minutes Intravenous Every 24 hours 04/17/17 2316 04/22/17 0917   04/18/17 2000  cefTRIAXone (ROCEPHIN) 1 g in dextrose 5 % 50 mL IVPB  Status:  Discontinued     1 g 100 mL/hr over 30 Minutes Intravenous Every 24 hours 04/17/17 2316 04/22/17 0918   04/18/17 2000  vancomycin (VANCOCIN) IVPB 750 mg/150 ml premix  Status:  Discontinued     750 mg 150 mL/hr over 60 Minutes Intravenous Every 12 hours 04/18/17 0741 04/21/17 1650   04/18/17 0800  vancomycin (VANCOCIN) 1,750 mg in sodium chloride 0.9 % 500 mL IVPB  Status:  Discontinued     1,750 mg 250 mL/hr over 120 Minutes Intravenous  Once 04/18/17 0738 04/18/17 0741   04/18/17 0800  vancomycin (VANCOCIN) 1,750 mg in sodium chloride 0.9 % 500 mL IVPB     1,750 mg 250 mL/hr over 120 Minutes Intravenous  Once 04/18/17 0741 04/18/17 1033   04/17/17 2130  cefTRIAXone (ROCEPHIN) 1 g in dextrose 5 % 50 mL IVPB     1 g 100 mL/hr over 30 Minutes Intravenous  Once 04/17/17 2127 04/17/17 2215   04/17/17 2130  azithromycin (ZITHROMAX) 500 mg in dextrose 5 % 250 mL IVPB     500 mg 250 mL/hr over 60 Minutes Intravenous  Once 04/17/17 2127 04/17/17 2245         Objective: Vitals:   04/22/17 2353 04/23/17 0422 04/23/17 0730 04/23/17 0824  BP: 112/73 (!) 142/86 (!) 175/94   Pulse: 75 90 94 93  Resp: 19 13 (!) 21   Temp: 97.8 F (36.6 C) 97.6 F (36.4 C)    TempSrc:  Axillary Axillary    SpO2: 98% 99% 99%   Weight:  82.6 kg (182 lb 1.6 oz)    Height:        Intake/Output Summary (Last 24 hours) at 04/23/2017 0911 Last data filed at 04/23/2017 0754 Gross per 24 hour  Intake 1191 ml  Output 1800 ml  Net -609 ml   Filed Weights   04/21/17 0413 04/22/17 0350 04/23/17 0422  Weight: 86.5 kg (190 lb 11.2 oz) 86.1 kg (189 lb 13.1 oz) 82.6 kg (182 lb 1.6 oz)    Examination:  General: NAD, awake and alert Cardiovascular: RRR, S1/S2 +, no rubs, no gallops Respiratory: Creased breath sounds at the bases, bibasilar crackles, slight expiratory wheezing at the right base Abdominal: Soft, NT, ND, Extremities: Bilateral lower extremity edema 1+ Neuro: Alert oriented x3, no focal findings, some intermittent confusion  Data Reviewed: I have personally reviewed following labs and imaging studies  CBC: Recent Labs  Lab 04/17/17 1954 04/18/17 0532 04/19/17 0313 04/20/17 0214 04/21/17 0356 04/22/17 0247 04/23/17 0243  WBC 19.0* 24.6* 19.2* 15.3* 10.1 12.9* 8.9  NEUTROABS 16.6* 22.6*  --   --   --   --   --   HGB 11.8* 10.6* 10.4* 9.7* 9.6* 10.5* 9.9*  HCT 38.5* 34.7* 33.7* 31.1* 31.1* 32.7* 32.3*  MCV 101.6* 102.7* 102.1* 101.0* 100.0 97.3 100.9*  PLT 142* 151 162 159 163 227 093   Basic Metabolic Panel: Recent Labs  Lab 04/18/17 0103 04/18/17 0532 04/21/17 0356 04/21/17 0837 04/22/17 0907 04/22/17 1519 04/23/17 0243  NA  --  143 140  --  144 143 143  K  --  3.9 2.4*  --  2.7* 3.2* 3.9  CL  --  110 104  --  105 103 103  CO2  --  18* 23  --  25 26 27   GLUCOSE  --  105* 95  --  141* 106* 141*  BUN  --  27* 22*  --  21* 22* 23*  CREATININE  --  1.46* 1.04  --  1.02 1.06 1.13  CALCIUM  --  7.4* 7.2*  --  7.9* 7.7* 8.0*  MG 1.6*  --   --  2.2 2.0  --  1.9   GFR: Estimated Creatinine Clearance: 52.9 mL/min (by C-G formula based on SCr of 1.13 mg/dL). Liver Function Tests: Recent Labs  Lab 04/17/17 1954 04/18/17 0532  AST 103* 77*  ALT  75* 70*  ALKPHOS 75 66  BILITOT 1.2 1.1  PROT 5.4* 4.9*  ALBUMIN 3.4* 2.9*   No results for input(s): LIPASE, AMYLASE in the last 168 hours. Recent Labs  Lab 04/17/17 2015  AMMONIA 37*   Coagulation Profile: Recent Labs  Lab 04/18/17 0103  INR 1.34   Cardiac Enzymes: Recent Labs  Lab 04/18/17 0103 04/18/17 0532 04/18/17 1114  TROPONINI 0.14* 0.35* 0.49*   BNP (last 3 results) No results for input(s): PROBNP in the last 8760 hours. HbA1C: No results for input(s): HGBA1C in the last 72 hours. CBG: Recent Labs  Lab 04/18/17 0001  GLUCAP 91   Lipid Profile: No results for input(s): CHOL, HDL, LDLCALC, TRIG, CHOLHDL, LDLDIRECT in the last 72 hours. Thyroid Function Tests: No results for input(s): TSH, T4TOTAL, FREET4, T3FREE, THYROIDAB in the last 72 hours. Anemia Panel: No results for input(s): VITAMINB12, FOLATE, FERRITIN, TIBC, IRON, RETICCTPCT in the last 72 hours. Sepsis Labs: Recent Labs  Lab 04/17/17 2017 04/18/17 0103 04/18/17 0532  PROCALCITON  --  24.36  --   LATICACIDVEN 2.95* 2.5* 2.6*    Recent Results (from the past 240 hour(s))  Blood Culture (routine x 2)     Status: None   Collection Time: 04/17/17  7:54 PM  Result Value Ref Range Status   Specimen Description BLOOD LEFT ANTECUBITAL  Final   Special Requests   Final    BOTTLES DRAWN AEROBIC AND ANAEROBIC Blood Culture adequate volume   Culture   Final    NO GROWTH 5 DAYS Performed at [facility], [address], [place], [place] [address]    Report Status 04/22/2017 FINAL  Final  Blood Culture (routine x 2)     Status: None   Collection Time: 04/17/17  8:07 PM  Result Value Ref Range Status   Specimen Description BLOOD RIGHT ARM  Final   Special Requests   Final    BOTTLES DRAWN AEROBIC AND ANAEROBIC Blood Culture adequate volume   Culture   Final    NO GROWTH 5 DAYS Performed at [facility], [place] [address], [place], [place] [address]  Report Status 04/22/2017 FINAL   Final  Urine culture     Status: None   Collection Time: 04/17/17  8:14 PM  Result Value Ref Range Status   Specimen Description URINE, CATHETERIZED  Final   Special Requests NONE  Final   Culture   Final    NO GROWTH Performed at [facility], [address] [place], [place] [address]    Report Status 04/19/2017 FINAL  Final      Radiology Studies: Dg Chest Port 1 View  Result Date: 04/22/2017 CLINICAL DATA:  Increasing shortness of breath. EXAM: PORTABLE CHEST 1 VIEW COMPARISON:  04/21/2017 and 04/19/2017 FINDINGS: There are persistent moderate bilateral pleural effusions. Heart size is normal. The azygos vein is distended and the pulmonary vascularity is slightly prominent. IMPRESSION: No significant change since the prior study. Moderate bilateral pleural effusions with slight pulmonary vascular congestion. Electronically Signed   By: Lorriane Shire M.D.   On: 04/22/2017 13:36   Dg Chest Port 1 View  Result Date: 04/21/2017 CLINICAL DATA:  Short of breath, chest pain EXAM: PORTABLE CHEST 1 VIEW COMPARISON:  04/19/2017 FINDINGS: Normal cardiac silhouette. Bibasilar effusions again noted. Bibasilar atelectasis present. Upper lungs are clear. No acute osseous abnormality. Posterior cervical fusion. IMPRESSION: No interval change.  Bilateral moderate pleural effusions. Electronically Signed   By: Suzy Bouchard M.D.   On: 04/21/2017 13:48      Scheduled Meds: . amLODipine  5 mg Oral Daily  . diltiazem  60 mg Oral TID  . divalproex  250 mg Oral Daily  . divalproex  750 mg Oral QHS  . docusate sodium  200 mg Oral BID  . ezetimibe  5 mg Oral QHS  . fluticasone  2 spray Each Nare Daily  . gabapentin  300 mg Oral BID  . levalbuterol  0.63 mg Nebulization TID  . levothyroxine  150 mcg Oral QAC breakfast  . losartan  100 mg Oral Daily  . metoprolol tartrate  25 mg Oral Once  . metoprolol tartrate  50 mg Oral BID  . polyethylene glycol  17 g Oral Daily  . pravastatin  40 mg  Oral QHS  . predniSONE  50 mg Oral Q breakfast  . QUEtiapine  25 mg Oral QPM  . zinc sulfate  220 mg Oral q morning - 10a   Continuous Infusions: . azithromycin Stopped (04/22/17 2350)  . cefTRIAXone (ROCEPHIN)  IV Stopped (04/22/17 2238)  . heparin 1,500 Units/hr (04/23/17 0821)     LOS: 6 days    Time spent: Total of 35 minutes spent with pt, greater than 50% of which was spent in discussion of  treatment, counseling and coordination of care   Chipper Oman, MD Pager: Text Page via [email or web address]   If 7PM-7AM, please contact night-coverage [email or web address] 04/23/2017, 9:11 AM

## 2017-04-23 NOTE — Progress Notes (Signed)
ANTICOAGULATION CONSULT NOTE - Follow Up Consult  Pharmacy Consult for heparin Indication: atrial fibrillation  Labs: Recent Labs    04/21/17 0356 04/22/17 0247 04/22/17 0907 04/22/17 1519 04/23/17 0243 04/23/17 0520  HGB 9.6* 10.5*  --   --  9.9*  --   HCT 31.1* 32.7*  --   --  32.3*  --   PLT 163 227  --   --  225  --   HEPARINUNFRC 0.44 0.46  --   --  <0.10* <0.10*  CREATININE 1.04  --  1.02 1.06 1.13  --     Assessment: 83yo male undetectable on heparin after several levels at goal, RN reports no gtt issues, verified accuracy with redraw.  Goal of Therapy:  Heparin level 0.3-0.7 units/ml   Plan:  Will rebolus with heparin 3000 units and increase heparin gtt by 4 units/kg/hr to 1500 units/hr and check level in 8 hours.    Wynona Neat, PharmD, BCPS  04/23/2017,7:38 AM

## 2017-04-24 ENCOUNTER — Other Ambulatory Visit: Payer: Self-pay

## 2017-04-24 LAB — CBC
HCT: 30.6 % — ABNORMAL LOW (ref 39.0–52.0)
Hemoglobin: 9.6 g/dL — ABNORMAL LOW (ref 13.0–17.0)
MCH: 31.3 pg (ref 26.0–34.0)
MCHC: 31.4 g/dL (ref 30.0–36.0)
MCV: 99.7 fL (ref 78.0–100.0)
Platelets: 205 10*3/uL (ref 150–400)
RBC: 3.07 MIL/uL — ABNORMAL LOW (ref 4.22–5.81)
RDW: 15.1 % (ref 11.5–15.5)
WBC: 10.9 10*3/uL — ABNORMAL HIGH (ref 4.0–10.5)

## 2017-04-24 LAB — PREALBUMIN: Prealbumin: 28.8 mg/dL (ref 18–38)

## 2017-04-24 LAB — BASIC METABOLIC PANEL
Anion gap: 14 (ref 5–15)
BUN: 28 mg/dL — ABNORMAL HIGH (ref 6–20)
CO2: 27 mmol/L (ref 22–32)
Calcium: 8.2 mg/dL — ABNORMAL LOW (ref 8.9–10.3)
Chloride: 100 mmol/L — ABNORMAL LOW (ref 101–111)
Creatinine, Ser: 1.25 mg/dL — ABNORMAL HIGH (ref 0.61–1.24)
GFR calc Af Amer: >60 mL/min (ref >60)
GFR calc non Af Amer: 52 mL/min — ABNORMAL LOW (ref >60)
Glucose, Bld: 97 mg/dL (ref 65–99)
Potassium: 3.6 mmol/L (ref 3.5–5.1)
Sodium: 141 mmol/L (ref 135–145)

## 2017-04-24 LAB — C-REACTIVE PROTEIN: CRP: <0.8 mg/dL (ref <1.0)

## 2017-04-24 LAB — MAGNESIUM: Magnesium: 1.7 mg/dL (ref 1.7–2.4)

## 2017-04-24 MED ORDER — PREDNISONE 20 MG PO TABS
30.0000 mg | ORAL_TABLET | Freq: Every day | ORAL | Status: DC
  Administered 2017-04-25: 06:00:00 30 mg via ORAL
  Filled 2017-04-24 (×2): qty 1

## 2017-04-24 MED ORDER — DOXAZOSIN MESYLATE 2 MG PO TABS
4.0000 mg | ORAL_TABLET | Freq: Every day | ORAL | Status: DC
  Administered 2017-04-24 – 2017-04-25 (×2): 4 mg via ORAL
  Filled 2017-04-24 (×2): qty 2

## 2017-04-24 NOTE — Progress Notes (Signed)
PROGRESS NOTE [facility]   EVERT WENRICH   TFT:[id number] DOB: 1933-06-09  DOA: 04/17/2017 PCP: [name], MD   Brief Narrative:  [name] is a 84 year old male with medical history significant for seizure disorders, chronic leg and decubitus wound, hypertension, hyperlipidemia, polyarthritis on prednisone, hypothyroidism, CKD stage III and CAD who presented to the emergency department with confusion and found to be on A. fib with RVR and febrile by EMS.  Upon ED evaluation he was on A. fib with mild hypotension and elevated lactic acid.  Chest x-ray showed infiltrate concerning of pneumonia and patient was admitted for working diagnosis of acute encephalopathy, sepsis from pneumonia and new onset A. fib with [facility] course has been complicated with delirium, hypoxia with worsening dyspnea chest x-ray shows increasing vascular congestion with moderate bilateral pleural effusions, echocardiogram obtain on 04/18/17 showed LVEF of 60-65%.  Cardiology has been consulted  Subjective: Patient seen and examined, wife at bedside, wife mentions that the patient is having more confusion than his baseline as well as wife is concerned regarding his ulcers which are reportedly getting worse per her.  Per patient denies any acute complaint.  No nausea no vomiting.  Assessment & Plan: Acute metabolic encephalopathy due to sepsis and hypoxia - slowly improving Treat underlying causes Improved, patient is oriented x3, denies any acute complaint.  He is able to follow all commands and is tolerating oral diet. Suspect that this is resolved.  Sepsis secondary to pneumonia and chronic leg wounds Influenza PCR negative, initially treated with Zosyn receive 4 days, now patient being treated with Zithromax and Rocephin.  Okay to stop Zithromax, will check pro-calcitonin levels if negative will discontinue Rocephin as well, if positive will complete treatment for total of 10 days. Blood  cultures and urine cultures so far negative, WBCs trending down.   In order to promote wound healing, will taper prednisone rapidly.  A. fib with RVR - improved Likely triggered by infectious process, patient being treated with Cardizem and Lopressor. Cardiology consulted. Initially on heparin now on Eliquis. Tolerating it well.   Acute respiratory failure due to pneumonia and fluid overload Patient on oxygen supplementation, treat underlying conditions. Wean oxygen as tolerated History of OSA, has refused CPAP in the past  Acute on chronic diastolic heart failure Due to aggressive fluid resuscitation for sepsis Receive IV Lasix 40 mg x2, good urine output and significant improvement on breathing, Repeated checks x-ray 2/12 shows persistent vascular congestion and pleural effusion Will continue IV Lasix '40mg'$  IV BID  Echo showed preserved ejection fraction with moderate LVH and moderate/severe dilated right atrium.  Consistent with diastolic dysfunction Elevated troponin suspect demand ischemia from A. fib and infectious process Monitor creatinine closely  Hypertension BP remains elevated Patient on atenolol, losartan, HCTZ, and amlodipine.  Was switched from atenolol to metoprolol, will increase metoprolol dose to 50 mg twice daily.  He is also on diltiazem 60 mg 3 times daily To need to monitor BP closely  AKI - resolved Creatinine 1.6 on admission Creatinine stable Monitor closely as patient is on aggressive diuresis  Seizure disorders Asymptomatic, continue Depakote  Chronic wound and decubitus ulcers - POA Wound care consulted Wife is concerned that the wounds are actually worsening.  Based on prior notes from wound care as well as current examination it does not appear that the patient's wounds are worsening.  ESR normal, CRP normal, prealbumin normal.  X-ray shows no evidence of bone erosion. Do not suspect that the  patient requires any further inpatient workup for this  wound. Recent ABI in #2018 was normal as well. We will recheck it. Patient is already scheduled to follow-up with wound care as well as dermatology as an outpatient, recommend to continue current care.  DVT prophylaxis: Heparin Code Status: Full code Family Communication: Wife at bedside Disposition Plan: SNF in 2-3 days  Consultants:   Cardiology  Procedures:   Echo  Antimicrobials: Anti-infectives (From admission, onward)   Start     Dose/Rate Route Frequency Ordered Stop   04/22/17 2200  azithromycin (ZITHROMAX) 500 mg in sodium chloride 0.9 % 250 mL IVPB  Status:  Discontinued     500 mg 250 mL/hr over 60 Minutes Intravenous Every 24 hours 04/22/17 0917 04/23/17 1014   04/22/17 2000  cefTRIAXone (ROCEPHIN) 1 g in sodium chloride 0.9 % 100 mL IVPB     1 g 200 mL/hr over 30 Minutes Intravenous Every 24 hours 04/22/17 0917 04/25/17 1959   04/19/17 0800  vancomycin (VANCOCIN) 1,250 mg in sodium chloride 0.9 % 250 mL IVPB  Status:  Discontinued     1,250 mg 166.7 mL/hr over 90 Minutes Intravenous Every 24 hours 04/18/17 0738 04/18/17 0741   04/18/17 2200  azithromycin (ZITHROMAX) 500 mg in dextrose 5 % 250 mL IVPB  Status:  Discontinued     500 mg 250 mL/hr over 60 Minutes Intravenous Every 24 hours 04/17/17 2316 04/22/17 0917   04/18/17 2000  cefTRIAXone (ROCEPHIN) 1 g in dextrose 5 % 50 mL IVPB  Status:  Discontinued     1 g 100 mL/hr over 30 Minutes Intravenous Every 24 hours 04/17/17 2316 04/22/17 0918   04/18/17 2000  vancomycin (VANCOCIN) IVPB 750 mg/150 ml premix  Status:  Discontinued     750 mg 150 mL/hr over 60 Minutes Intravenous Every 12 hours 04/18/17 0741 04/21/17 1650   04/18/17 0800  vancomycin (VANCOCIN) 1,750 mg in sodium chloride 0.9 % 500 mL IVPB  Status:  Discontinued     1,750 mg 250 mL/hr over 120 Minutes Intravenous  Once 04/18/17 0738 04/18/17 0741   04/18/17 0800  vancomycin (VANCOCIN) 1,750 mg in sodium chloride 0.9 % 500 mL IVPB     1,750 mg 250  mL/hr over 120 Minutes Intravenous  Once 04/18/17 0741 04/18/17 1033   04/17/17 2130  cefTRIAXone (ROCEPHIN) 1 g in dextrose 5 % 50 mL IVPB     1 g 100 mL/hr over 30 Minutes Intravenous  Once 04/17/17 2127 04/17/17 2215   04/17/17 2130  azithromycin (ZITHROMAX) 500 mg in dextrose 5 % 250 mL IVPB     500 mg 250 mL/hr over 60 Minutes Intravenous  Once 04/17/17 2127 04/17/17 2245         Objective: Vitals:   04/24/17 0604 04/24/17 0709 04/24/17 1159 04/24/17 1349  BP:  (!) 156/64 132/73   Pulse:  92 96   Resp:  11 18   Temp:  97.6 F (36.4 C) 97.8 F (36.6 C) 98.6 F (37 C)  TempSrc:  Oral Oral Oral  SpO2:  96% 96%   Weight: 80 kg (176 lb 5.9 oz)     Height:        Intake/Output Summary (Last 24 hours) at 04/24/2017 1409 Last data filed at 04/24/2017 1035 Gross per 24 hour  Intake 480 ml  Output 2626 ml  Net -2146 ml   Filed Weights   04/22/17 0350 04/23/17 0422 04/24/17 0604  Weight: 86.1 kg (189 lb 13.1 oz) 82.6 kg (  182 lb 1.6 oz) 80 kg (176 lb 5.9 oz)    Examination:  General: NAD, awake and alert Cardiovascular: RRR, S1/S2 +, no rubs, no gallops Respiratory: Creased breath sounds at the bases, bibasilar crackles, slight expiratory wheezing at the right base Abdominal: Soft, NT, ND, Extremities: Bilateral lower extremity edema 1+ Neuro: Alert oriented x3, no focal findings, some intermittent confusion  Data Reviewed: I have personally reviewed following labs and imaging studies  CBC: Recent Labs  Lab 04/17/17 1954 04/18/17 0532  04/20/17 0214 04/21/17 0356 04/22/17 0247 04/23/17 0243 04/24/17 0300  WBC 19.0* 24.6*   < > 15.3* 10.1 12.9* 8.9 10.9*  NEUTROABS 16.6* 22.6*  --   --   --   --   --   --   HGB 11.8* 10.6*   < > 9.7* 9.6* 10.5* 9.9* 9.6*  HCT 38.5* 34.7*   < > 31.1* 31.1* 32.7* 32.3* 30.6*  MCV 101.6* 102.7*   < > 101.0* 100.0 97.3 100.9* 99.7  PLT 142* 151   < > 159 163 227 225 205   < > = values in this interval not displayed.   Basic  Metabolic Panel: Recent Labs  Lab 04/18/17 0103  04/21/17 0356 04/21/17 0837 04/22/17 0907 04/22/17 1519 04/23/17 0243 04/24/17 0300  NA  --    < > 140  --  144 143 143 141  K  --    < > 2.4*  --  2.7* 3.2* 3.9 3.6  CL  --    < > 104  --  105 103 103 100*  CO2  --    < > 23  --  '25 26 27 27  '$ GLUCOSE  --    < > 95  --  141* 106* 141* 97  BUN  --    < > 22*  --  21* 22* 23* 28*  CREATININE  --    < > 1.04  --  1.02 1.06 1.13 1.25*  CALCIUM  --    < > 7.2*  --  7.9* 7.7* 8.0* 8.2*  MG 1.6*  --   --  2.2 2.0  --  1.9 1.7   < > = values in this interval not displayed.   GFR: Estimated Creatinine Clearance: 47.9 mL/min (A) (by C-G formula based on SCr of 1.25 mg/dL (H)). Liver Function Tests: Recent Labs  Lab 04/17/17 1954 04/18/17 0532  AST 103* 77*  ALT 75* 70*  ALKPHOS 75 66  BILITOT 1.2 1.1  PROT 5.4* 4.9*  ALBUMIN 3.4* 2.9*   No results for input(s): LIPASE, AMYLASE in the last 168 hours. Recent Labs  Lab 04/17/17 2015  AMMONIA 37*   Coagulation Profile: Recent Labs  Lab 04/18/17 0103  INR 1.34   Cardiac Enzymes: Recent Labs  Lab 04/18/17 0103 04/18/17 0532 04/18/17 1114  TROPONINI 0.14* 0.35* 0.49*   BNP (last 3 results) No results for input(s): PROBNP in the last 8760 hours. HbA1C: No results for input(s): HGBA1C in the last 72 hours. CBG: Recent Labs  Lab 04/18/17 0001  GLUCAP 91   Lipid Profile: No results for input(s): CHOL, HDL, LDLCALC, TRIG, CHOLHDL, LDLDIRECT in the last 72 hours. Thyroid Function Tests: No results for input(s): TSH, T4TOTAL, FREET4, T3FREE, THYROIDAB in the last 72 hours. Anemia Panel: No results for input(s): VITAMINB12, FOLATE, FERRITIN, TIBC, IRON, RETICCTPCT in the last 72 hours. Sepsis Labs: Recent Labs  Lab 04/17/17 2017 04/18/17 0103 04/18/17 0532 04/23/17 0243  PROCALCITON  --  24.36  --  1.34  LATICACIDVEN 2.95* 2.5* 2.6*  --     Recent Results (from the past 240 hour(s))  Blood Culture (routine x 2)      Status: None   Collection Time: 04/17/17  7:54 PM  Result Value Ref Range Status   Specimen Description BLOOD LEFT ANTECUBITAL  Final   Special Requests   Final    BOTTLES DRAWN AEROBIC AND ANAEROBIC Blood Culture adequate volume   Culture   Final    NO GROWTH 5 DAYS Performed at [facility], [address] [place], [place] [address]    Report Status 04/22/2017 FINAL  Final  Blood Culture (routine x 2)     Status: None   Collection Time: 04/17/17  8:07 PM  Result Value Ref Range Status   Specimen Description BLOOD RIGHT ARM  Final   Special Requests   Final    BOTTLES DRAWN AEROBIC AND ANAEROBIC Blood Culture adequate volume   Culture   Final    NO GROWTH 5 DAYS Performed at [facility], [place] [address], [place], [place] [address]    Report Status 04/22/2017 FINAL  Final  Urine culture     Status: None   Collection Time: 04/17/17  8:14 PM  Result Value Ref Range Status   Specimen Description URINE, CATHETERIZED  Final   Special Requests NONE  Final   Culture   Final    NO GROWTH Performed at [facility], [place] [address], [place], [place] [address]    Report Status 04/19/2017 FINAL  Final      Radiology Studies: Dg Chest Port 1 View  Result Date: 04/23/2017 CLINICAL DATA:  Pleural effusion. EXAM: PORTABLE CHEST 1 VIEW COMPARISON:  Radiograph of June 20, 2017. FINDINGS: Stable cardiomediastinal silhouette is noted with central pulmonary vascular congestion. No pneumothorax is noted. Stable bilateral pleural effusions are noted, with left larger than right. Stable bibasilar opacities are noted concerning for subsegmental atelectasis or possibly edema. Bony thorax is unremarkable. IMPRESSION: Stable bibasilar opacities are noted concerning for subsegmental atelectasis or possibly edema. Stable bilateral pleural effusions are noted with left greater than right. Stable central pulmonary vascular congestion. Electronically Signed   By: Marijo Conception, M.D.    On: 04/23/2017 10:15      Scheduled Meds: . amLODipine  5 mg Oral Daily  . apixaban  5 mg Oral BID  . diltiazem  180 mg Oral Daily  . divalproex  250 mg Oral Daily  . divalproex  750 mg Oral QHS  . docusate sodium  200 mg Oral BID  . doxazosin  4 mg Oral Daily  . ezetimibe  5 mg Oral QHS  . fluticasone  2 spray Each Nare Daily  . furosemide  40 mg Oral Daily  . gabapentin  300 mg Oral BID  . levothyroxine  150 mcg Oral QAC breakfast  . losartan  100 mg Oral Daily  . metoprolol tartrate  50 mg Oral BID  . polyethylene glycol  17 g Oral Daily  . pravastatin  40 mg Oral QHS  . [START ON 04/25/2017] predniSONE  30 mg Oral Q breakfast  . QUEtiapine  25 mg Oral QPM  . zinc sulfate  220 mg Oral q morning - 10a   Continuous Infusions: . cefTRIAXone (ROCEPHIN)  IV Stopped (04/23/17 2224)     LOS: 7 days    Time spent: Total of 35 minutes spent with pt, greater than 50% of which  was spent in discussion of  treatment, counseling and coordination of care   Berle Mull, MD Pager: Text Page via [email or web address]   If 7PM-7AM, please contact night-coverage [email or web address] 04/24/2017, 2:09 PM

## 2017-04-24 NOTE — Progress Notes (Signed)
Physical Therapy Treatment Patient Details Name: [name] MRN: [id number] DOB: 02/24/1934 Today's Date: 04/24/2017    History of Present Illness 84yo male brought to the ED with confusion, found to be in A-fib with RVR by EMS. DIagnosed with sepsis, new A-fib with RVR, acute encephalopathy. PMH chronic R LE wound, HTN, CKD, seizures, memory loss, hypothyroidism, OA, skin CA, hx vertigo, hx back surgery, hx cardiac cath, knee arthoplasty, laminectomy, posterior cervical fusion     PT Comments    Patient received in bed with sitter and wife present, willing to participate in PT session today. Note he continues to remain confused but is pleasant at baseline. He continues to require MaxA x2 for functional bed mobility, able to perform functional sit to stand with RW and MaxAx2 at side of bed today, but only able to maintain standing for approximately 10 seconds before needing to sit back down and stating "I don't feel good", note vitals remain WNL. Patient became increasingly agitated sitting EOB and refused further attempts at mobility to chair or standing again, stating "..you all are outright BITCHES". Returned patient to bed at this point, repositioned with [place]. He continues to remain appropriate for SNF moving forward.     Follow Up Recommendations  SNF     Equipment Recommendations  None recommended by PT(defer to next venue )    Recommendations for Other Services       Precautions / Restrictions Precautions Precautions: Fall;Other (comment) Precaution Comments: watch HR/O2; gets agitated and potentially combative  Restrictions Weight Bearing Restrictions: No    Mobility  Bed Mobility Overal bed mobility: Needs Assistance Bed Mobility: Supine to Sit;Sit to Supine     Supine to sit: Max assist;+2 for physical assistance Sit to supine: Max assist;+2 for physical assistance   General bed mobility comments: patient continues to require MaxAx2 for safe functional  mobility, able to maintain midline sitting EOB wiith HR remaining WNL  Transfers Overall transfer level: Needs assistance Equipment used: Rolling walker (2 wheeled) Transfers: Sit to/from Stand Sit to Stand: Max assist;+2 physical assistance         General transfer comment: MaxA x2 for safety and physical assistance, patient able to stand approximateyl 10 seconds before sitting back down, stating "I don't feel good" but vitals per monitor remaining WNL. Became agitated while sitting at EOB, stating "you all are outright BITCHES"   Ambulation/Gait             General Gait Details: unable/patient refused    Stairs            Wheelchair Mobility    Modified Rankin (Stroke Patients Only)       Balance Overall balance assessment: Needs assistance Sitting-balance support: Feet supported;Bilateral upper extremity supported Sitting balance-Leahy Scale: Fair Sitting balance - Comments: can maintain mid line sitting at EOB but requires Min gaurd      Standing balance-Leahy Scale: Poor Standing balance comment: reliant on UE support and external assistance from PT staff                             Cognition Arousal/Alertness: Awake/alert Behavior During Therapy: Agitated;Flat affect Overall Cognitive Status: History of cognitive impairments - at baseline                                 General Comments: history cognitive deficits at baseline; patient's wife reports  he has never been like this before       Exercises      General Comments General comments (skin integrity, edema, etc.): patient confused and increasingly agitated today      Pertinent Vitals/Pain Pain Assessment: Faces Pain Score: 0-No pain Faces Pain Scale: No hurt Pain Intervention(s): Limited activity within patient's tolerance;Monitored during session    Home Living                      Prior Function            PT Goals (current goals can now be  found in the care plan section) Acute Rehab PT Goals PT Goal Formulation: Patient unable to participate in goal setting Progress towards PT goals: Progressing toward goals    Frequency    Min 2X/week      PT Plan Current plan remains appropriate    Co-evaluation              AM-PAC PT "6 Clicks" Daily Activity  Outcome Measure  Difficulty turning over in bed (including adjusting bedclothes, sheets and blankets)?: Unable Difficulty moving from lying on back to sitting on the side of the bed? : Unable Difficulty sitting down on and standing up from a chair with arms (e.g., wheelchair, bedside commode, etc,.)?: Unable Help needed moving to and from a bed to chair (including a wheelchair)?: Total Help needed walking in hospital room?: Total Help needed climbing 3-5 steps with a railing? : Total 6 Click Score: 6    End of Session Equipment Utilized During Treatment: Gait belt Activity Tolerance: Patient tolerated treatment well Patient left: in bed;with bed alarm set;with call bell/phone within reach;with nursing/sitter in room;with family/visitor present   PT Visit Diagnosis: Unsteadiness on feet (R26.81);Muscle weakness (generalized) (M62.81);Difficulty in walking, not elsewhere classified (R26.2);Other abnormalities of gait and mobility (R26.89)     Time: 6010-9323 PT Time Calculation (min) (ACUTE ONLY): 21 min  Charges:  $Therapeutic Activity: 8-22 mins                    G Codes:       Deniece Ree PT, DPT, CBIS  Supplemental Physical Therapist [place]   Pager [phone]

## 2017-04-24 NOTE — NC FL2 (Signed)
[place] LEVEL OF CARE SCREENING TOOL     IDENTIFICATION  Patient Name: [name] Birthdate: 1934/01/30 Sex: male Admission Date (Current Location): 04/17/2017  [facility] and [place] Number:  Herbalist and Address:  [facility], [place] [address] [place],  [address]      Provider Number: [id number]  Attending Physician Name and Address:  Lavina Hamman, MD  Relative Name and Phone Number:       Current Level of Care: Hospital Recommended Level of Care: [place] Prior Approval Number:    Date Approved/Denied:   PASRR Number: [id number]  Discharge Plan: SNF    Current Diagnoses: Patient Active Problem List   Diagnosis Date Noted  . CKD (chronic kidney disease) stage 3, GFR 30-59 ml/min (HCC)   . CAP (community acquired pneumonia) 04/17/2017  . Atrial fibrillation with RVR ([place]) 04/17/2017  . Hallux rigidus, right foot 03/10/2016  . Lumbosacral spondylosis without myelopathy 10/29/2015  . Memory difficulty 09/22/2015  . Abnormality of gait 09/22/2015  . Seizures ([place]) 07/06/2015  . Hyperglycemia 07/06/2015  . Leukocytosis 07/06/2015  . Metabolic acidosis 11/94/1740  . Chronic pain 07/06/2015  . Chronic insomnia 03/30/2015  . Transient alteration of awareness 03/30/2015  . Abnormal liver function   . Sepsis ([place]) 01/31/2015  . Altered mental status 01/31/2015  . Essential hypertension 01/31/2015  . Constipation 01/31/2015  . Hypothyroidism 01/31/2015  . Acute encephalopathy 01/31/2015  . Bladder outlet obstruction 01/31/2015  . GERD (gastroesophageal reflux disease) 01/31/2015  . Chronic back pain 01/31/2015  . Sigmoid diverticulitis 01/31/2015  . Acute kidney injury ([place]) 01/31/2015    Orientation RESPIRATION BLADDER Height & Weight     Self  Normal Continent Weight: 176 lb 5.9 oz (80 kg) Height:  5\' 10"  (177.8 cm)  BEHAVIORAL SYMPTOMS/MOOD NEUROLOGICAL BOWEL NUTRITION STATUS   Continent Diet  AMBULATORY STATUS COMMUNICATION OF NEEDS Skin   Extensive Assist Verbally Other (Comment)(multiple wounds that are followed by outpatient wound center- wound care consult on 04/18/17 details wounds)                       Personal Care Assistance Level of Assistance  Bathing, Dressing Bathing Assistance: Maximum assistance   Dressing Assistance: Maximum assistance     Functional Limitations Info             Cecilia  PT (By licensed PT), OT (By licensed OT)     PT Frequency: 5/wk OT Frequency: 5/wk            Contractures      Additional Factors Info  Code Status, Allergies Code Status Info: FULL Allergies Info: Demerol Meperidine, Keppra Levetiracetam           Current Medications (04/24/2017):  This is the current hospital active medication list Current Facility-Administered Medications  Medication Dose Route Frequency Provider Last Rate Last Dose  . acetaminophen (TYLENOL) tablet 650 mg  650 mg Oral Q6H PRN [name], MD   650 mg at 04/19/17 0132   Or  . acetaminophen (TYLENOL) suppository 650 mg  650 mg Rectal Q6H PRN [name], MD      . amLODipine (NORVASC) tablet 5 mg  5 mg Oral Daily Molt, Bethany, DO   5 mg at 04/24/17 1036  . apixaban (ELIQUIS) tablet 5 mg  5 mg Oral BID Romona Curls, RPH   5 mg at 04/24/17 1035  .  cefTRIAXone (ROCEPHIN) 1 g in sodium chloride 0.9 % 100 mL IVPB  1 g Intravenous Q24H Patrecia Pour, Christean Grief, MD   Stopped at 04/23/17 2224  . diltiazem (CARDIZEM CD) 24 hr capsule 180 mg  180 mg Oral Daily Skeet Latch, MD   180 mg at 04/24/17 1036  . divalproex (DEPAKOTE) DR tablet 250 mg  250 mg Oral Daily [name], MD   250 mg at 04/24/17 1037  . divalproex (DEPAKOTE) DR tablet 750 mg  750 mg Oral QHS [name], MD   750 mg at 04/23/17 2023  . docusate sodium (COLACE) capsule 200 mg  200 mg Oral BID [name], MD   200 mg at 04/24/17 1036  .  doxazosin (CARDURA) tablet 4 mg  4 mg Oral Daily Skeet Latch, MD   4 mg at 04/24/17 1036  . ezetimibe (ZETIA) tablet 5 mg  5 mg Oral QHS [name], MD   5 mg at 04/23/17 2023  . fluticasone (FLONASE) 50 MCG/ACT nasal spray 2 spray  2 spray Each Nare Daily Molt, Bethany, DO   2 spray at 04/24/17 1037  . furosemide (LASIX) tablet 40 mg  40 mg Oral Daily Skeet Latch, MD   40 mg at 04/24/17 1036  . gabapentin (NEURONTIN) capsule 300 mg  300 mg Oral BID [name], MD   300 mg at 04/24/17 1035  . hydrALAZINE (APRESOLINE) injection 10 mg  10 mg Intravenous Q4H PRN Arby Barrette A, NP   10 mg at 04/22/17 0413  . HYDROcodone-acetaminophen (NORCO) 10-325 MG per tablet 1 tablet  1 tablet Oral Q6H PRN [name], MD   1 tablet at 04/23/17 2229  . levalbuterol (XOPENEX) nebulizer solution 0.63 mg  0.63 mg Nebulization Q6H PRN Patrecia Pour, Christean Grief, MD      . levothyroxine (SYNTHROID, LEVOTHROID) tablet 150 mcg  150 mcg Oral QAC breakfast [name], MD   150 mcg at 04/24/17 [phone]  . losartan (COZAAR) tablet 100 mg  100 mg Oral Daily Bodenheimer, Charles A, NP   100 mg at 04/24/17 1036  . metoprolol tartrate (LOPRESSOR) tablet 50 mg  50 mg Oral BID Molt, Bethany, DO   50 mg at 04/24/17 1036  . nitroGLYCERIN (NITROSTAT) SL tablet 0.4 mg  0.4 mg Sublingual Q5 min PRN [name], MD   0.4 mg at 04/19/17 0409  . ondansetron (ZOFRAN) tablet 4 mg  4 mg Oral Q6H PRN [name], MD       Or  . ondansetron [facility]) injection 4 mg  4 mg Intravenous Q6H PRN [name], MD      . polyethylene glycol (MIRALAX / GLYCOLAX) packet 17 g  17 g Oral Daily Patrecia Pour, Christean Grief, MD   17 g at 04/24/17 1035  . pravastatin (PRAVACHOL) tablet 40 mg  40 mg Oral QHS [name], MD   40 mg at 04/23/17 2022  . [START ON 04/25/2017] predniSONE (DELTASONE) tablet 30 mg  30 mg Oral Q breakfast Lavina Hamman, MD      . QUEtiapine (SEROQUEL) tablet  25 mg  25 mg Oral QPM Emokpae, Courage, MD   25 mg at 04/23/17 1753  . zinc sulfate capsule 220 mg  220 mg Oral q morning - 10a [name], MD   220 mg at 04/24/17 1036     Discharge Medications: Please see discharge summary for a list of discharge medications.  Relevant Imaging Results:  Relevant  Lab Results:   Additional Information SS#: [id number]  Jorge Ny, LCSW

## 2017-04-24 NOTE — Progress Notes (Signed)
Progress Note  Patient Name: [name] Date of Encounter: 04/24/2017   Primary Cardiologist: Skeet Latch, MD   Subjective   Feeling well.  No chest pain or short of breath.  Reports having terrible nightmares during his stay.   Inpatient Medications    Scheduled Meds: . amLODipine  5 mg Oral Daily  . apixaban  5 mg Oral BID  . diltiazem  180 mg Oral Daily  . divalproex  250 mg Oral Daily  . divalproex  750 mg Oral QHS  . docusate sodium  200 mg Oral BID  . doxazosin  4 mg Oral Daily  . ezetimibe  5 mg Oral QHS  . fluticasone  2 spray Each Nare Daily  . furosemide  40 mg Oral Daily  . gabapentin  300 mg Oral BID  . levothyroxine  150 mcg Oral QAC breakfast  . losartan  100 mg Oral Daily  . metoprolol tartrate  50 mg Oral BID  . polyethylene glycol  17 g Oral Daily  . pravastatin  40 mg Oral QHS  . predniSONE  40 mg Oral Q breakfast  . QUEtiapine  25 mg Oral QPM  . zinc sulfate  220 mg Oral q morning - 10a   Continuous Infusions: . cefTRIAXone (ROCEPHIN)  IV Stopped (04/23/17 2224)   PRN Meds: acetaminophen **OR** acetaminophen, hydrALAZINE, HYDROcodone-acetaminophen, levalbuterol, nitroGLYCERIN, ondansetron **OR** ondansetron (ZOFRAN) IV   Vital Signs    Vitals:   04/23/17 2348 04/24/17 0347 04/24/17 0604 04/24/17 0709  BP: 136/85 (!) 153/85  (!) 156/64  Pulse: 82 89  92  Resp: 19 18  11   Temp: 98.1 F (36.7 C) 97.9 F (36.6 C)  97.6 F (36.4 C)  TempSrc: Oral Oral  Oral  SpO2: 95% 99%  96%  Weight:   176 lb 5.9 oz (80 kg)   Height:        Intake/Output Summary (Last 24 hours) at 04/24/2017 0937 Last data filed at 04/24/2017 0647 Gross per 24 hour  Intake 600 ml  Output 3376 ml  Net -2776 ml   Filed Weights   04/22/17 0350 04/23/17 0422 04/24/17 0604  Weight: 189 lb 13.1 oz (86.1 kg) 182 lb 1.6 oz (82.6 kg) 176 lb 5.9 oz (80 kg)    Telemetry    Atrial fibrillation.  Rate <100 bpm.  - Personally Reviewed  ECG     n/a - Personally  Reviewed  Physical Exam   VS:  BP (!) 156/64 (BP Location: Right Arm)   Pulse 92   Temp 97.6 F (36.4 C) (Oral)   Resp 11   Ht 5\' 10"  (1.778 m)   Wt 176 lb 5.9 oz (80 kg)   SpO2 96%   BMI 25.31 kg/m  , BMI Body mass index is 25.31 kg/m. GENERAL:  Well appearing.  No acute distress HEENT: Pupils equal round and reactive, fundi not visualized, oral mucosa unremarkable NECK:  No jugular venous distention, waveform within normal limits, carotid upstroke brisk and symmetric, no bruits LUNGS:  Clear to auscultation bilaterally HEART: Irregularly irregular PMI not displaced or sustained,S1 and S2 within normal limits, no S3, no S4, no clicks, no rubs, no murmurs ABD:  Flat, positive bowel sounds normal in frequency in pitch, no bruits, no rebound, no guarding, no midline pulsatile mass, no hepatomegaly, no splenomegaly EXT:  2 plus pulses throughout, no edema, no cyanosis no clubbing SKIN:  No rashes no nodules NEURO:  Cranial nerves II through XII grossly intact, motor grossly  intact throughout [facility]:  Cognitively intact, oriented to person place and time   Labs    Chemistry Recent Labs  Lab 04/17/17 1954 04/18/17 0532  04/22/17 1519 04/23/17 0243 04/24/17 0300  NA 146* 143   < > 143 143 141  K 3.7 3.9   < > 3.2* 3.9 3.6  CL 108 110   < > 103 103 100*  CO2 24 18*   < > 26 27 27   GLUCOSE 111* 105*   < > 106* 141* 97  BUN 28* 27*   < > 22* 23* 28*  CREATININE 1.50* 1.46*   < > 1.06 1.13 1.25*  CALCIUM 8.5* 7.4*   < > 7.7* 8.0* 8.2*  PROT 5.4* 4.9*  --   --   --   --   ALBUMIN 3.4* 2.9*  --   --   --   --   AST 103* 77*  --   --   --   --   ALT 75* 70*  --   --   --   --   ALKPHOS 75 66  --   --   --   --   BILITOT 1.2 1.1  --   --   --   --   GFRNONAA 42* 43*   < > >60 59* 52*  GFRAA 49* 50*   < > >60 >60 >60  ANIONGAP 14 15   < > 14 13 14    < > = values in this interval not displayed.     Hematology Recent Labs  Lab 04/22/17 0247 04/23/17 0243 04/24/17 0300    WBC 12.9* 8.9 10.9*  RBC 3.36* 3.20* 3.07*  HGB 10.5* 9.9* 9.6*  HCT 32.7* 32.3* 30.6*  MCV 97.3 100.9* 99.7  MCH 31.3 30.9 31.3  MCHC 32.1 30.7 31.4  RDW 14.4 15.1 15.1  PLT 227 225 205    Cardiac Enzymes Recent Labs  Lab 04/18/17 0103 04/18/17 0532 04/18/17 1114  TROPONINI 0.14* 0.35* 0.49*   No results for input(s): TROPIPOC in the last 168 hours.   BNPNo results for input(s): BNP, PROBNP in the last 168 hours.   DDimer No results for input(s): DDIMER in the last 168 hours.   Radiology    Dg Chest Port 1 View  Result Date: 04/23/2017 CLINICAL DATA:  Pleural effusion. EXAM: PORTABLE CHEST 1 VIEW COMPARISON:  Radiograph of June 20, 2017. FINDINGS: Stable cardiomediastinal silhouette is noted with central pulmonary vascular congestion. No pneumothorax is noted. Stable bilateral pleural effusions are noted, with left larger than right. Stable bibasilar opacities are noted concerning for subsegmental atelectasis or possibly edema. Bony thorax is unremarkable. IMPRESSION: Stable bibasilar opacities are noted concerning for subsegmental atelectasis or possibly edema. Stable bilateral pleural effusions are noted with left greater than right. Stable central pulmonary vascular congestion. Electronically Signed   By: Marijo Conception, M.D.   On: 04/23/2017 10:15   Dg Chest Port 1 View  Result Date: 04/22/2017 CLINICAL DATA:  Increasing shortness of breath. EXAM: PORTABLE CHEST 1 VIEW COMPARISON:  04/21/2017 and 04/19/2017 FINDINGS: There are persistent moderate bilateral pleural effusions. Heart size is normal. The azygos vein is distended and the pulmonary vascularity is slightly prominent. IMPRESSION: No significant change since the prior study. Moderate bilateral pleural effusions with slight pulmonary vascular congestion. Electronically Signed   By: Lorriane Shire M.D.   On: 04/22/2017 13:36    Cardiac Studies   Echo 04/18/17: Study Conclusions - Left ventricle: The cavity size  was  normal. Wall thickness was increased in a pattern of moderate LVH. Systolic function was normal. The estimated ejection fraction was in the range of 60% to 65%  Wall motion was normal; there were no regional wallmotion abnormalities. - Mitral valve: There was mild regurgitation. - Left atrium: The atrium was moderately to severely dilated. - Right atrium: The atrium was moderately dilated. - Pulmonary arteries: Systolic pressure was mildly to moderately increased. PA peak pressure: 37 mm Hg (S).   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  Assessment & Plan    # Paroxysmal atrial fibrillation: Rates are well-controlled.  Continue metoprolol and diltiazem.  He was started on Eliquis this hospitalization.  # Hypertension: Blood pressure remains above goal.  Diltiazem was started this admission.  If possible we would like to get him off of amlodipine.  Increase doxazosin to 4 mg daily.  Continue diltiazem, metoprolol, and losartan.  Lasix was switched to oral today.  # Acute diastolic heart failure:  VOlume status is much better.  Stop IV Lasix and switch to 40 mg p.o. daily.  Renal function is starting to worsen.  # CAD s/p PCI: Not an active issue.  Aspirin was stopped and he has been started on Eliquis.  Continue metoprolol and Zetia.     For questions or updates, please contact [place] Please consult [email or web address] for contact info under Cardiology/STEMI.      Signed, Skeet Latch, MD  04/24/2017, 9:37 AM

## 2017-04-24 NOTE — Clinical Social Work Note (Signed)
Clinical Social Work Assessment  Patient Details  Name: [name] MRN: [id number] Date of Birth: 1933-11-13  Date of referral:  04/24/17               Reason for consult:  Facility Placement                Permission sought to share information with:  Facility Sport and exercise psychologist, Family Supports Permission granted to share information::  No(pt disoriented spoke to next of kin)  Name::     Actuary::  SNF  Relationship::  wife  Contact Information:     Housing/Transportation Living arrangements for the past 2 months:  Single Family Home Source of Information:  Spouse Patient Interpreter Needed:  None Criminal Activity/Legal Involvement Pertinent to Current Situation/Hospitalization:  No - Comment as needed Significant Relationships:  Spouse Lives with:  Spouse Do you feel safe going back to the place where you live?  No Need for family participation in patient care:  Yes (Comment)(decision making)  Care giving concerns:  Pt lives at home with wife- wife does not feel pt would be manageable at home with current level of physical needs and current mental status.   Social Worker assessment / plan:  CSW spoke with pt and pt wife concerning PT recommendation for SNF.  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  CSW discussed PT recommendation of SNF with pt wife.  Wife reports pt has been to SNF in the past.  Wife very concerned with SNF caring for pt wounds- states pt goes to [facility] every 1-2 weeks to be check on but that at home she changes dressings q2 days or PRN if high drainage.  Employment status:  Retired Forensic scientist:  [employer] PT Recommendations:  [facility] / Referral to community resources:  [place]  Patient/Family's Response to care:  Pt wife agreeable to SNF- very realistic about her limitations for caring  for pt at home given current impairment level- hopeful for high rated facility.  Patient/Family's Understanding of and Emotional Response to Diagnosis, Current Treatment, and Prognosis:  Pt wife very knowledgeable about pt current status and needs- hopeful that pt will improve with rehab with goal of returning home.  Emotional Assessment Appearance:  Appears stated age Attitude/Demeanor/Rapport:    Affect (typically observed):  Appropriate, Pleasant Orientation:  Oriented to Self Alcohol / Substance use:  Not Applicable Psych involvement (Current and /or in the community):  No (Comment)  Discharge Needs  Concerns to be addressed:  Care Coordination Readmission within the last 30 days:  No Current discharge risk:  Physical Impairment Barriers to Discharge:  Continued Medical Work up   Jorge Ny, LCSW 04/24/2017, 2:35 PM

## 2017-04-25 ENCOUNTER — Encounter (HOSPITAL_COMMUNITY): Payer: Self-pay

## 2017-04-25 ENCOUNTER — Telehealth: Payer: Self-pay | Admitting: Cardiovascular Disease

## 2017-04-25 LAB — ENA+DNA/DS+ANTICH+CENTRO+JO...
Anti JO-1: <0.2 AI (ref 0.0–0.9)
Centromere Ab Screen: <0.2 AI (ref 0.0–0.9)
Chromatin Ab SerPl-aCnc: <0.2 AI (ref 0.0–0.9)
ENA SM Ab Ser-aCnc: <0.2 AI (ref 0.0–0.9)
Ribonucleic Protein: 3.4 AI — ABNORMAL HIGH (ref 0.0–0.9)
SSA (Ro) (ENA) Antibody, IgG: <0.2 AI (ref 0.0–0.9)
SSB (La) (ENA) Antibody, IgG: <0.2 AI (ref 0.0–0.9)
Scleroderma (Scl-70) (ENA) Antibody, IgG: <0.2 AI (ref 0.0–0.9)
ds DNA Ab: <1 IU/mL (ref 0–9)

## 2017-04-25 LAB — CBC
HCT: 31.7 % — ABNORMAL LOW (ref 39.0–52.0)
Hemoglobin: 9.9 g/dL — ABNORMAL LOW (ref 13.0–17.0)
MCH: 31.1 pg (ref 26.0–34.0)
MCHC: 31.2 g/dL (ref 30.0–36.0)
MCV: 99.7 fL (ref 78.0–100.0)
Platelets: 225 10*3/uL (ref 150–400)
RBC: 3.18 MIL/uL — ABNORMAL LOW (ref 4.22–5.81)
RDW: 15.2 % (ref 11.5–15.5)
WBC: 10 10*3/uL (ref 4.0–10.5)

## 2017-04-25 LAB — BASIC METABOLIC PANEL
Anion gap: 13 (ref 5–15)
BUN: 32 mg/dL — ABNORMAL HIGH (ref 6–20)
CO2: 29 mmol/L (ref 22–32)
Calcium: 8.4 mg/dL — ABNORMAL LOW (ref 8.9–10.3)
Chloride: 101 mmol/L (ref 101–111)
Creatinine, Ser: 1.22 mg/dL (ref 0.61–1.24)
GFR calc Af Amer: >60 mL/min (ref >60)
GFR calc non Af Amer: 54 mL/min — ABNORMAL LOW (ref >60)
Glucose, Bld: 94 mg/dL (ref 65–99)
Potassium: 3.6 mmol/L (ref 3.5–5.1)
Sodium: 143 mmol/L (ref 135–145)

## 2017-04-25 LAB — ANA W/REFLEX IF POSITIVE: Anti Nuclear Antibody(ANA): POSITIVE — AB

## 2017-04-25 LAB — MAGNESIUM: Magnesium: 1.9 mg/dL (ref 1.7–2.4)

## 2017-04-25 MED ORDER — DILTIAZEM HCL ER COATED BEADS 180 MG PO CP24
300.0000 mg | ORAL_CAPSULE | Freq: Every day | ORAL | Status: DC
  Administered 2017-04-25: 11:00:00 300 mg via ORAL
  Filled 2017-04-25: qty 1

## 2017-04-25 MED ORDER — PREDNISONE 10 MG PO TABS: 10.0000 mg | ORAL_TABLET | Freq: Every day | ORAL | Status: DC

## 2017-04-25 MED ORDER — DILTIAZEM HCL ER COATED BEADS 300 MG PO CP24: 300.0000 mg | ORAL_CAPSULE | Freq: Every day | ORAL | 0 refills | Status: DC

## 2017-04-25 MED ORDER — PREDNISONE 20 MG PO TABS: 20.0000 mg | ORAL_TABLET | Freq: Every day | ORAL | 0 refills | Status: AC

## 2017-04-25 MED ORDER — LIDOCAINE 5 % EX PTCH
1.0000 | MEDICATED_PATCH | CUTANEOUS | Status: DC
  Administered 2017-04-25: 1 via TRANSDERMAL
  Filled 2017-04-25: qty 1

## 2017-04-25 MED ORDER — APIXABAN 5 MG PO TABS: 5.0000 mg | ORAL_TABLET | Freq: Two times a day (BID) | ORAL | 0 refills | Status: DC

## 2017-04-25 MED ORDER — QUETIAPINE FUMARATE 25 MG PO TABS: 12.5000 mg | ORAL_TABLET | Freq: Two times a day (BID) | ORAL | 0 refills | Status: DC

## 2017-04-25 MED ORDER — QUETIAPINE FUMARATE 25 MG PO TABS
12.5000 mg | ORAL_TABLET | Freq: Every day | ORAL | Status: DC
  Administered 2017-04-25: 12.5 mg via ORAL
  Filled 2017-04-25: qty 1

## 2017-04-25 MED ORDER — DOXAZOSIN MESYLATE 4 MG PO TABS: 4.0000 mg | ORAL_TABLET | Freq: Every day | ORAL | 0 refills | Status: DC

## 2017-04-25 MED ORDER — FUROSEMIDE 40 MG PO TABS: 40.0000 mg | ORAL_TABLET | Freq: Every day | ORAL | 0 refills | Status: DC

## 2017-04-25 MED ORDER — METOPROLOL TARTRATE 50 MG PO TABS: 50.0000 mg | ORAL_TABLET | Freq: Two times a day (BID) | ORAL | 0 refills | Status: DC

## 2017-04-25 MED ORDER — HYDROCODONE-ACETAMINOPHEN 10-325 MG PO TABS: 1.0000 | ORAL_TABLET | Freq: Four times a day (QID) | ORAL | 0 refills | Status: DC | PRN

## 2017-04-25 MED ORDER — POLYETHYLENE GLYCOL 3350 17 G PO PACK: 17.0000 g | PACK | Freq: Every day | ORAL | 0 refills | Status: DC | PRN

## 2017-04-25 NOTE — Progress Notes (Signed)
Progress Note  Patient Name: [name] Date of Encounter: 04/25/2017   Primary Cardiologist: Skeet Latch, MD   Subjective   Complains of back pain and wanting to go home.  Otherwise well.   Inpatient Medications    Scheduled Meds: . amLODipine  5 mg Oral Daily  . apixaban  5 mg Oral BID  . diltiazem  180 mg Oral Daily  . divalproex  250 mg Oral Daily  . divalproex  750 mg Oral QHS  . docusate sodium  200 mg Oral BID  . doxazosin  4 mg Oral Daily  . ezetimibe  5 mg Oral QHS  . fluticasone  2 spray Each Nare Daily  . furosemide  40 mg Oral Daily  . gabapentin  300 mg Oral BID  . levothyroxine  150 mcg Oral QAC breakfast  . losartan  100 mg Oral Daily  . metoprolol tartrate  50 mg Oral BID  . polyethylene glycol  17 g Oral Daily  . pravastatin  40 mg Oral QHS  . predniSONE  30 mg Oral Q breakfast  . QUEtiapine  12.5 mg Oral Daily  . QUEtiapine  25 mg Oral QPM  . zinc sulfate  220 mg Oral q morning - 10a   Continuous Infusions:  PRN Meds: acetaminophen **OR** acetaminophen, hydrALAZINE, HYDROcodone-acetaminophen, levalbuterol, nitroGLYCERIN, ondansetron **OR** ondansetron (ZOFRAN) IV   Vital Signs    Vitals:   04/24/17 2325 04/25/17 0335 04/25/17 0545 04/25/17 0840  BP: 120/88 (!) 153/78  123/75  Pulse: 82 91  100  Resp: 17 16  18   Temp: 97.8 F (36.6 C) 97.7 F (36.5 C)  97.8 F (36.6 C)  TempSrc: Oral Oral  Oral  SpO2: 98% 99%  96%  Weight:   176 lb 12.9 oz (80.2 kg)   Height:        Intake/Output Summary (Last 24 hours) at 04/25/2017 0915 Last data filed at 04/25/2017 0841 Gross per 24 hour  Intake 240 ml  Output 2075 ml  Net -1835 ml   Filed Weights   04/23/17 0422 04/24/17 0604 04/25/17 0545  Weight: 182 lb 1.6 oz (82.6 kg) 176 lb 5.9 oz (80 kg) 176 lb 12.9 oz (80.2 kg)    Telemetry    Atrial fibrillation.  Rate 90s-100s. - Personally Reviewed  ECG     n/a - Personally Reviewed  Physical Exam   VS:  BP 123/75 (BP Location:  Right Arm)   Pulse 100   Temp 97.8 F (36.6 C) (Oral)   Resp 18   Ht 5\' 10"  (1.778 m)   Wt 176 lb 12.9 oz (80.2 kg)   SpO2 96%   BMI 25.37 kg/m  , BMI Body mass index is 25.37 kg/m. GENERAL:  Well appearing.  No acute distress HEENT: Pupils equal round and reactive, fundi not visualized, oral mucosa unremarkable NECK:  No jugular venous distention, waveform within normal limits, carotid upstroke brisk and symmetric, no bruits LUNGS:  Clear to auscultation bilaterally HEART: Irregularly irregular PMI not displaced or sustained,S1 and S2 within normal limits, no S3, no S4, no clicks, no rubs, no murmurs ABD:  Flat, positive bowel sounds normal in frequency in pitch, no bruits, no rebound, no guarding, no midline pulsatile mass, no hepatomegaly, no splenomegaly EXT:  2 plus pulses throughout, no edema, no cyanosis no clubbing SKIN:  No rashes no nodules NEURO:  Cranial nerves II through XII grossly intact, motor grossly intact throughout PSYCH:  Cognitively intact, oriented to person place and  time   Labs    Chemistry Recent Labs  Lab 04/23/17 0243 04/24/17 0300 04/25/17 0344  NA 143 141 143  K 3.9 3.6 3.6  CL 103 100* 101  CO2 27 27 29   GLUCOSE 141* 97 94  BUN 23* 28* 32*  CREATININE 1.13 1.25* 1.22  CALCIUM 8.0* 8.2* 8.4*  GFRNONAA 59* 52* 54*  GFRAA >60 >60 >60  ANIONGAP 13 14 13      Hematology Recent Labs  Lab 04/23/17 0243 04/24/17 0300 04/25/17 0344  WBC 8.9 10.9* 10.0  RBC 3.20* 3.07* 3.18*  HGB 9.9* 9.6* 9.9*  HCT 32.3* 30.6* 31.7*  MCV 100.9* 99.7 99.7  MCH 30.9 31.3 31.1  MCHC 30.7 31.4 31.2  RDW 15.1 15.1 15.2  PLT 225 205 225    Cardiac Enzymes Recent Labs  Lab 04/18/17 1114  TROPONINI 0.49*   No results for input(s): TROPIPOC in the last 168 hours.   BNPNo results for input(s): BNP, PROBNP in the last 168 hours.   DDimer No results for input(s): DDIMER in the last 168 hours.   Radiology    Dg Chest Port 1 View  Result Date:  04/23/2017 CLINICAL DATA:  Pleural effusion. EXAM: PORTABLE CHEST 1 VIEW COMPARISON:  Radiograph of June 20, 2017. FINDINGS: Stable cardiomediastinal silhouette is noted with central pulmonary vascular congestion. No pneumothorax is noted. Stable bilateral pleural effusions are noted, with left larger than right. Stable bibasilar opacities are noted concerning for subsegmental atelectasis or possibly edema. Bony thorax is unremarkable. IMPRESSION: Stable bibasilar opacities are noted concerning for subsegmental atelectasis or possibly edema. Stable bilateral pleural effusions are noted with left greater than right. Stable central pulmonary vascular congestion. Electronically Signed   By: Marijo Conception, M.D.   On: 04/23/2017 10:15    Cardiac Studies   Echo 04/18/17: Study Conclusions - Left ventricle: The cavity size was normal. Wall thickness was increased in a pattern of moderate LVH. Systolic function was normal. The estimated ejection fraction was in the range of 60% to 65%  Wall motion was normal; there were no regional wallmotion abnormalities. - Mitral valve: There was mild regurgitation. - Left atrium: The atrium was moderately to severely dilated. - Right atrium: The atrium was moderately dilated. - Pulmonary arteries: Systolic pressure was mildly to moderately increased. PA peak pressure: 37 mm Hg (S).   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  Assessment & Plan    # Paroxysmal atrial fibrillation: Rates remain mostly <100 bpm.  Continue metoprolol and increase diltiazem to 300mg  daily.  He was started on Eliquis this hospitalization.  # Hypertension: Blood pressure is much better controlled.  Diltiazem was started this admission.  We will stop amlodipine and increase diltiazem to 300mg  daily. Continue doxazosin, lasix, and losartan.  # Acute  diastolic heart failure:  Volume status is much better.  Continue oral lasix.  Renal function improving.   # CAD s/p PCI: Not an active issue.  Aspirin was stopped and he has been started on Eliquis.  Continue metoprolol and Zetia.     For questions or updates, please contact [place] Please consult [email or web address] for contact info under Cardiology/STEMI.      Signed, Skeet Latch, MD  04/25/2017, 9:15 AM

## 2017-04-25 NOTE — Care Management Note (Signed)
Case Management Note Marvetta Gibbons RN, BSN Unit 4E-Case Manager [phone]  Patient Details  Name: [name] MRN: [id number] Date of Birth: 03-Mar-1934  Subjective/Objective:  Pt admitted with sepsis/pna, chronic leg wounds                  Action/Plan: PTA pt lived at home with wife- per PT eval recommendations for SNF- CSW consulted for placement needs  Expected Discharge Date:  04/25/17               Expected Discharge Plan:  Skilled Nursing Facility  In-House Referral:  Clinical Social Work  Discharge planning Services  CM Consult  Post Acute Care Choice:    Choice offered to:     DME Arranged:    DME Agency:     HH Arranged:    [facility] Agency:     Status of Service:  Completed, signed off  If discussed at [employer], dates discussed:    Discharge Disposition: skilled facility   Additional Comments:  04/25/17- 1200- Kurstyn Larios RN, CM- pt stable for d/c to SNF today CSW following for placement.   Dawayne [name], RN 04/25/2017, 12:38 PM

## 2017-04-25 NOTE — Clinical Social Work Placement (Signed)
   CLINICAL SOCIAL WORK PLACEMENT  NOTE  Date:  04/25/2017  Patient Details  Name: [name] MRN: [id number] Date of Birth: 10-29-1933  Clinical Social Work is seeking post-discharge placement for this patient at the [place] level of care (*CSW will initial, date and re-position this form in  chart as items are completed):  Yes   Patient/family provided with [place] Work Department's list of facilities offering this level of care within the geographic area requested by the patient (or if unable, by the patient's family).  Yes   Patient/family informed of their freedom to choose among providers that offer the needed level of care, that participate in Medicare, Medicaid or managed care program needed by the patient, have an available bed and are willing to accept the patient.  Yes   Patient/family informed of [facility]'s ownership interest in [facility] and [facility], as well as of the fact that they are under no obligation to receive care at these facilities.  PASRR submitted to EDS on       PASRR number received on       Existing PASRR number confirmed on 04/24/17     FL2 transmitted to all facilities in geographic area requested by pt/family on 04/24/17     FL2 transmitted to all facilities within larger geographic area on       Patient informed that his/her managed care company has contracts with or will negotiate with certain facilities, including the following:        Yes   Patient/family informed of bed offers received.  Patient chooses bed at [facility]     Physician recommends and patient chooses bed at      Patient to be transferred to [facility] on 04/25/17.  Patient to be transferred to facility by ptar     Patient family notified on 04/25/17 of transfer.  Name of family member notified:  [name]     PHYSICIAN Please sign FL2, Please prepare prescriptions     Additional Comment:     _______________________________________________ Jorge Ny, LCSW 04/25/2017, 1:34 PM

## 2017-04-25 NOTE — Discharge Summary (Addendum)
[facility] Discharge Summary   Patient: [name] YSA:[id number]   PCP: [name], MD DOB: September 26, 1933   Date of admission: 04/17/2017   Date of discharge:  04/25/2017    Discharge Diagnoses:  Principal Problem:   Sepsis [facility]) Active Problems:   Essential hypertension   Hypothyroidism   Acute encephalopathy   Seizures ([place])   Memory difficulty   CAP (community acquired pneumonia)   Atrial fibrillation with RVR ([place])   CKD (chronic kidney disease) stage 3, GFR 30-59 ml/min ([place])   Admitted From: home Disposition:  SNF  Recommendations for Outpatient Follow-up:  1. Please follow-up with PCP in 1 week, cardiology as recommended. 2. Also establish care with orthopedic for shoulder pain, get a referral from PCP.  Follow-up Information    Barrett, Evelene Croon, PA-C Follow up on 05/02/2017.   Specialties:  Cardiology, Radiology Why:  9:00 AM for TCM Contact information: [address] [place] [address] [phone]        [name], MD. Schedule an appointment as soon as possible for a visit in 1 week(s).   Specialty:  Internal Medicine Contact information: [place] [address] [phone]        Skeet Latch, MD .   Specialty:  Cardiology Contact information: [address] [place] [address] [place] [address] [place]             . Schedule an appointment as soon as possible for a visit in 2 week(s).   Contact information: [address] [place] [phone]         Diet recommendation: Cardiac diet  Activity: The patient is advised to gradually reintroduce usual activities.  Discharge Condition: good  Code Status: Full code  History of present illness: As per the H and P dictated on admission, " JANZIEL [name] is a 84 y.o. male with history of chronic wound of the right leg, hypertension, chronic  kidney disease, seizures, memory loss, hypothyroidism was brought to the ER after patient was found to be confused.  As per patient's wife who provided the history patient had gone to the dentist this morning and had reached home until then patient was appearing normal.  Following which patient gradually became more confused.  Since patient became confused patient's wife called EMS and patient was found to be in A. fib with RVR and febrile.  Patient was brought to the ER.  Before reaching ER patient was given 1 L fluid bolus for sepsis.  Patient did not have any chest pain or shortness of breath nausea vomiting or diarrhea.  Patient is on chronic steroids for arthritis.  ED Course: In the ER patient was found to be in A. fib with RVR and mildly hypotensive with elevated lactate level patient was given fluid bolus per sepsis protocol and blood cultures obtained.  Chest x-ray shows infiltrates concerning for pneumonia.  At that point patient was having some cough.  Patient also has chronic wound on the right leg.  Which has been dressed and patient wife states he has been following with wound center and has been in chronic drainage.  Patient's A. fib with RVR improved with fluids.  Patient's blood pressure also improved with fluids.  Patient is being admitted for sepsis likely from pneumonia could also be from the wound of the right leg.  He was started on stress dose steroids."  Hospital Course:  Summary of his active problems in the hospital is as following. Acute metabolic encephalopathy due to sepsis and hypoxia - slowly improving Hospital-acquired delirium Treat underlying causes Improved, patient is oriented x3, denies any acute complaint.  He is able to follow all commands and is tolerating oral diet. Mild agitation, started on Seroquel earlier during the stay will continue on discharge for now.  Discontinue this medication once in home environment.  Sepsis secondary to pneumonia and chronic leg  wounds Influenza PCR negative, initially treated with Zosyn receive 4 days, now patient being treated with Zithromax and Rocephin.   completed appropriate antibiotic treatment for total of 10 days.  Blood cultures and urine cultures so far negative, WBCs trending down.   In order to promote wound healing, will taper prednisone rapidly. ESR, CRP, CK, x-ray foot shows no evidence of chronic inflammation. Recent ABI shows triphasic waves bilaterally with ABI value more than 1. Patient is on chronic prednisone which is probably the cause of chronic nonhealing of the wound.  A. fib with RVR - improved Likely triggered by infectious process, patient being treated with Cardizem and Lopressor. Cardiology consulted. Initially on heparin now on Eliquis. Tolerating it well.   Acute respiratory failure due to pneumonia and fluid overload Patient on oxygen supplementation, treat underlying conditions. Wean oxygen as tolerated History of OSA, has refused CPAP in the past  Acute on chronic diastolic heart failure Due to aggressive fluid resuscitation for sepsis Receive IV Lasix 40 mg x2, good urine output and significant improvement on breathing, Repeated x-ray 2/12 shows persistent vascular congestion and pleural effusion Echo showed preserved ejection fraction with moderate LVH and moderate/severe dilated right atrium.  Consistent with diastolic dysfunction Elevated troponin suspect demand ischemia from A. fib and infectious process Now on oral Lasix continue on discharge.  Hypertension BP remains elevated Patient on atenolol, losartan, HCTZ, and amlodipine.  Atenolol switched to Lopressor. Started on Cardizem during the stay therefore amlodipine was discontinued. Due to uncontrolled blood pressure Cardura was added. HCTZ was discontinued and patient will be discharged on oral Lasix.  AKI - resolved Creatinine 1.6 on admission Creatinine stable Monitor closely as patient is on  aggressive diuresis, ideally recommend recheck in 1 week.  Seizure disorders Asymptomatic, continue Depakote  Chronic wound and decubitus ulcers - POA Wound care consulted Wife is concerned that the wounds are actually worsening.  Based on prior notes from wound care as well as current examination it does not appear that the patient's wounds are worsening.  ESR normal, CRP normal, prealbumin normal.  X-ray shows no evidence of bone erosion. Do not suspect that the patient requires any further inpatient workup for this wound. Recent ABI in #2018 was normal as well. Patient is already scheduled to follow-up with wound care as well as dermatology as an outpatient, recommend to continue current care.  Left inner calf with chronic full thickness wound; black moist wound bed, large amt yellow drainage, no odor, 2X1X.4cm Right inner ankle with chronic full thickness wound, bone palpable, .3X.3cm, 100% yellow dry wound bed Right outer calf with chronic full thickness wound; red dry woundbed, .3X.3cm, no odor. Left great toe with 3 areas of full thickness wounds; red and dry; .3X.3X.1cm/.2X.2X.1cm/.2X.2X.2cm, no odor or drainage Left 4th toe near nailbed with full thickness wound; .2X.2X.1cm, red and dry, no odor or drainage Right great toe near previous nailbed (no longer present) with full thickness wound; .2X.2X.1cm, red and dry, no odor or drainage Right 4th toe near  nailbed with full thickness wound; .2X.2X.1cm, red and dry, no odor or drainage Left arm partial thickness skin tear; 2X2X.1cm, skin approximated over 50% of woundbed, 50% red and small amt bloody drainage; attempted to approximate skin over wound with steristrips and applied foam dressing to protect and promote healing.  Dressing procedure/placement/frequency:  Steristrips and silicone foam dressing to left arm skin tear to assist with healing and and avoid adherence to skin upon removal.  Apply small piece of collagen dressing to  left great toe, (in 3 locations), left 4th toe, right inner ankle, right outer calf, right great toe, right 4th toe.  Patient's wife has the collagen dressings at the bedside. Change dressings as follows: Cleanse wounds with NS and gauze to remove previous dressings, then moisten a bandaid slightly with NS and apply the piece of collagen, then center over the wound and adhere the bandaids. Pt's wife can assist bedside nurse with the dressings, or change them if she desires.  Change dressing to right inner calf Q day as follows: Moisten with NS and gauze to remove previous dressing and cleanse the wound.  Cut a narrow strip of Aquacel Kellie Simmering # [phone]) and tuck into the wound, using swab to fill.  Cover with ABD pad and kerlex   All other chronic medical condition were stable during the hospitalization.  Patient was seen by physical therapy, who recommended SNF, which was arranged by Education officer, museum and case Freight forwarder. On the day of the discharge the patient's vitals were stable, and no other acute medical condition were reported by patient. the patient was felt safe to be discharge at [facility] with therapy.  Procedures and Results:   Echocardiogram Study Conclusions  - Left ventricle: The cavity size was normal. Wall thickness was   increased in a pattern of moderate LVH. Systolic function was   normal. The estimated ejection fraction was in the range of 60%   to 65%. Wall motion was normal; there were no regional wall   motion abnormalities. - Mitral valve: There was mild regurgitation. - Left atrium: The atrium was moderately to severely dilated. - Right atrium: The atrium was moderately dilated. - Pulmonary arteries: Systolic pressure was mildly to moderately   increased. PA peak pressure: 37 mm Hg (S).  Consultations:  Cardiology  Wound care  DISCHARGE MEDICATION: Allergies as of 04/25/2017      Reactions   Demerol [meperidine] Nausea And Vomiting   Keppra [levetiracetam] Other (See  Comments)   Causes sleepiness      Medication List    STOP taking these medications   amLODipine 5 MG tablet Commonly known as:  NORVASC   aspirin EC 81 MG tablet   atenolol 25 MG tablet Commonly known as:  TENORMIN   hydrochlorothiazide 25 MG tablet Commonly known as:  HYDRODIURIL   sulindac 200 MG tablet Commonly known as:  CLINORIL     TAKE these medications   alendronate 70 MG tablet Commonly known as:  FOSAMAX Take 70 mg by mouth every Sunday.   apixaban 5 MG Tabs tablet Commonly known as:  ELIQUIS Take 1 tablet (5 mg total) by mouth 2 (two) times daily.   calcium carbonate 600 MG Tabs tablet Commonly known as:  OS-CAL Take 600 mg by mouth 2 (two) times daily with a meal.   CoQ10 200 MG Caps Take 200 mg by mouth at bedtime.   cyanocobalamin 500 MCG tablet Take 500 mcg by mouth at bedtime.   diltiazem 300 MG 24 hr capsule  Commonly known as:  CARDIZEM CD Take 1 capsule (300 mg total) by mouth daily. Start taking on:  04/26/2017   divalproex 250 MG DR tablet Commonly known as:  DEPAKOTE Take 3 tablets (750 mg total) by mouth every 12 (twelve) hours. What changed:    how much to take  when to take this  additional instructions   docusate sodium 100 MG capsule Commonly known as:  COLACE Take 200 mg by mouth 2 (two) times daily.   doxazosin 4 MG tablet Commonly known as:  CARDURA Take 1 tablet (4 mg total) by mouth daily. Start taking on:  04/26/2017   furosemide 40 MG tablet Commonly known as:  LASIX Take 1 tablet (40 mg total) by mouth daily. Start taking on:  04/26/2017   gabapentin 300 MG capsule Commonly known as:  NEURONTIN Take 300 mg by mouth 2 (two) times daily.   HYDROcodone-acetaminophen 10-325 MG tablet Commonly known as:  NORCO Take 1 tablet by mouth See admin instructions. 1 tablet by mouth every four to six hours   levothyroxine 150 MCG tablet Commonly known as:  SYNTHROID, LEVOTHROID Take 150 mcg by mouth daily before  breakfast.   losartan 100 MG tablet Commonly known as:  COZAAR Take 1 tablet (100 mg total) by mouth daily.   metoprolol tartrate 50 MG tablet Commonly known as:  LOPRESSOR Take 1 tablet (50 mg total) by mouth 2 (two) times daily.   multivitamin with minerals Tabs tablet Take 1 tablet by mouth at bedtime.   nitroGLYCERIN 0.4 MG SL tablet Commonly known as:  NITROSTAT Place 0.4 mg under the tongue every 5 (five) minutes as needed for chest pain. Reported on 07/20/2015   omeprazole 20 MG capsule Commonly known as:  PRILOSEC Take 20 mg by mouth Daily.   OVER THE COUNTER MEDICATION KerraCell: Use for packing/dressing changes on right leg   OVER THE COUNTER MEDICATION ColActive Plus Collagen Matrix Dressing(s): Use for daily wound care on affected areas   polyethylene glycol packet Commonly known as:  MIRALAX / GLYCOLAX Take 17 g by mouth daily as needed for moderate constipation.   pravastatin 40 MG tablet Commonly known as:  PRAVACHOL Take 40 mg by mouth at bedtime.   predniSONE 20 MG tablet Commonly known as:  DELTASONE Take 1 tablet (20 mg total) by mouth daily with breakfast for 2 days. What changed:  You were already taking a medication with the same name, and this prescription was added. Make sure you understand how and when to take each.   predniSONE 10 MG tablet Commonly known as:  DELTASONE Take 1 tablet (10 mg total) by mouth daily with breakfast. Start taking on:  04/28/2017 What changed:    when to take this  These instructions start on 04/28/2017. If you are unsure what to do until then, ask your doctor or other care provider.   QUEtiapine 25 MG tablet Commonly known as:  SEROQUEL Take 0.5-1 tablets (12.5-25 mg total) by mouth 2 (two) times daily. 12.5 mg in AM and 25 mg in PM   senna 8.6 MG tablet Commonly known as:  SENOKOT Take 1 tablet by mouth 2 (two) times daily.   sodium chloride 0.9 % nebulizer solution 3 ml's in conjunction with daily wound  care maintenance   vitamin A 8000 UNIT capsule Take 8,000 Units by mouth 2 (two) times daily.   vitamin C 500 MG tablet Commonly known as:  ASCORBIC ACID Take 500 mg by mouth every morning.   Vitamin  D-3 1000 units Caps Take 1,000 Units by mouth 2 (two) times daily.   ZETIA 10 MG tablet Generic drug:  ezetimibe Take 5 mg by mouth at bedtime.   zinc gluconate 50 MG tablet Take 50 mg by mouth every morning.      Allergies  Allergen Reactions  . Demerol [Meperidine] Nausea And Vomiting  . Keppra [Levetiracetam] Other (See Comments)    Causes sleepiness   Discharge Instructions    Diet - low sodium heart healthy   Complete by:  As directed    Discharge instructions   Complete by:  As directed    It is important that you read following instructions as well as go over your medication list with RN to help you understand your care after this hospitalization.  Discharge Instructions: Please follow-up with PCP in one week  Please request your primary care physician to go over all Hospital Tests and Procedure/Radiological results at the follow up,  Please get all Hospital records sent to your PCP by signing hospital release before you go home.   Do not take more than prescribed Pain, Sleep and Anxiety Medications. You were cared for by a hospitalist during your hospital stay. If you have any questions about your discharge medications or the care you received while you were in the hospital after you are discharged, you can call the unit and ask to speak with the hospitalist on call if the hospitalist that took care of you is not available.  Once you are discharged, your primary care physician will handle any further medical issues. Please note that NO REFILLS for any discharge medications will be authorized once you are discharged, as it is imperative that you return to your primary care physician (or establish a relationship with a primary care physician if you do not have one) for  your aftercare needs so that they can reassess your need for medications and monitor your lab values. You Must read complete instructions/literature along with all the possible adverse reactions/side effects for all the Medicines you take and that have been prescribed to you. Take any new Medicines after you have completely understood and accept all the possible adverse reactions/side effects. Wear Seat belts while driving. If you have smoked or chewed Tobacco in the last 2 yrs please stop smoking and/or stop any Recreational drug use.   Increase activity slowly   Complete by:  As directed      Discharge Exam: Filed Weights   04/23/17 0422 04/24/17 0604 04/25/17 0545  Weight: 82.6 kg (182 lb 1.6 oz) 80 kg (176 lb 5.9 oz) 80.2 kg (176 lb 12.9 oz)   Vitals:   04/25/17 0830 04/25/17 0840  BP: 140/65 123/75  Pulse: 100 100  Resp:  18  Temp: 97.8 F (36.6 C) 97.8 F (36.6 C)  SpO2: 94% 96%   General: Appear in no distress, no Rash; Oral Mucosa moist. Chronic non healing wound on right ankle Cardiovascular: S1 and S2 Present, no Murmur, no JVD Respiratory: Bilateral Air entry present and Clear to Auscultation, no Crackles, no wheezes Abdomen: Bowel Sound present, Soft and no tenderness Extremities: no Pedal edema, no calf tenderness Neurology: Grossly no focal neuro deficit.  The results of significant diagnostics from this hospitalization (including imaging, microbiology, ancillary and laboratory) are listed below for reference.    Significant Diagnostic Studies: Dg Tibia/fibula Right  Result Date: 04/18/2017 CLINICAL DATA:  Cellulitis EXAM: RIGHT TIBIA AND FIBULA - 2 VIEW COMPARISON:  Right ankle radiographs March 30, 2017  FINDINGS: Frontal and lateral views were obtained. 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 No acute fracture or dislocation. No erosive change or bony destruction evident. There is extensive arterial vascular calcification as well  as soft tissue calcification which may be indicative of chronic stasis with likely cellulitis. There are spurs arising from posterior inferior calcaneus. There are plantar fascia calcifications. A bandage overlies the distal tibia and fibula. IMPRESSION: No fracture or dislocation. No erosive change or bony destruction. 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 Soft tissue calcifications likely due to chronic stasis and cellulitis. Extensive arterial vascular calcification also noted in the trifurcation and popliteal arteries. There is a plantar fascia calcification as well as calcaneal spurs. Electronically Signed   By: Lowella Grip III M.D.   On: 04/18/2017 07:55   Dg Ankle 2 Views Right  Result Date: 04/10/2017 CLINICAL DATA:  Nonhealing ulcer of the medial malleolus. EXAM: RIGHT ANKLE - 2 VIEW COMPARISON:  None. FINDINGS: There is no evidence of fracture, dislocation, or joint effusion. There is no aggressive osseous lesion. There is a plantar calcaneal spur. 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 There is no periosteal reaction or bone destruction. There is a soft tissue ulcer overlying the medial malleolus. There is peripheral vascular atherosclerotic disease. There is partial ossification of the plantar fascia adjacent to the calcaneal insertion. IMPRESSION: No evidence of osteomyelitis of the right ankle. Electronically Signed   By: Kathreen Devoid   On: 04/10/2017 08:32   Ct Head Wo Contrast  Result Date: 04/17/2017 CLINICAL DATA:  Acute onset of altered mental status. Atrial fibrillation. EXAM: CT HEAD WITHOUT CONTRAST TECHNIQUE: Contiguous axial images were obtained from the base of the skull through the vertex without intravenous contrast. COMPARISON:  CT of the head performed 07/06/2015 FINDINGS: Brain: No evidence of acute infarction, hemorrhage, hydrocephalus, extra-axial collection or mass lesion / mass effect.  Prominence of the ventricles and sulci reflects mild cortical volume loss. Scattered periventricular white matter change likely reflects small vessel ischemic microangiopathy. The brainstem and fourth ventricle are within normal limits. The basal ganglia are unremarkable in appearance. The cerebral hemispheres demonstrate grossly normal gray-white differentiation. No mass effect or midline shift is seen. Vascular: No hyperdense vessel or unexpected calcification. Skull: There is no evidence of fracture; visualized osseous structures are unremarkable in appearance. Sinuses/Orbits: The orbits are within normal limits. The paranasal sinuses and mastoid air cells are well-aerated. Other: No significant soft tissue abnormalities are seen. IMPRESSION: 1. No acute intracranial pathology seen on CT. 2. Mild cortical volume loss and scattered small vessel ischemic microangiopathy. Electronically Signed   By: Garald Balding M.D.   On: 04/17/2017 21:35   Dg Chest Port 1 View  Result Date: 04/23/2017 CLINICAL DATA:  Pleural effusion. EXAM: PORTABLE CHEST 1 VIEW COMPARISON:  Radiograph of June 20, 2017. FINDINGS: Stable cardiomediastinal silhouette is noted with central pulmonary vascular congestion. No pneumothorax is noted. Stable bilateral pleural effusions are noted, with left larger than right. Stable bibasilar opacities are noted concerning for subsegmental atelectasis or possibly edema. Bony thorax is unremarkable. IMPRESSION: Stable bibasilar opacities are noted concerning for subsegmental atelectasis or possibly edema. Stable bilateral pleural effusions are noted with left greater than right. Stable central pulmonary vascular congestion. Electronically Signed   By: Marijo Conception, M.D.   On: 04/23/2017 10:15   Dg Chest Port 1 View  Result Date: 04/22/2017 CLINICAL DATA:  Increasing shortness of breath. EXAM: PORTABLE CHEST 1  VIEW COMPARISON:  04/21/2017 and 04/19/2017 FINDINGS: There are persistent moderate  bilateral pleural effusions. Heart size is normal. The azygos vein is distended and the pulmonary vascularity is slightly prominent. IMPRESSION: No significant change since the prior study. Moderate bilateral pleural effusions with slight pulmonary vascular congestion. Electronically Signed   By: Lorriane Shire M.D.   On: 04/22/2017 13:36   Dg Chest Port 1 View  Result Date: 04/21/2017 CLINICAL DATA:  Short of breath, chest pain EXAM: PORTABLE CHEST 1 VIEW COMPARISON:  04/19/2017 FINDINGS: Normal cardiac silhouette. Bibasilar effusions again noted. Bibasilar atelectasis present. Upper lungs are clear. No acute osseous abnormality. Posterior cervical fusion. IMPRESSION: No interval change.  Bilateral moderate pleural effusions. Electronically Signed   By: Suzy Bouchard M.D.   On: 04/21/2017 13:48   Dg Chest Port 1 View  Result Date: 04/19/2017 CLINICAL DATA:  84 year old male with shortness of breath, agitation. EXAM: PORTABLE CHEST 1 VIEW COMPARISON:  04/17/2017 and earlier. FINDINGS: Portable AP semi upright view at 0642 hrs. Increased veiling opacity at both lung bases. Increased streaky opacity about the right hilum. Portion of the diaphragm is now obscured. Stable cardiac size and mediastinal contours. No pneumothorax. Pulmonary vascularity appears stable without overt edema. Chronic appearing right lateral 8th rib fracture. Negative visible bowel gas pattern. Extensive postoperative changes to the cervical spine. IMPRESSION: Increased bibasilar pulmonary opacity greater on the right is nonspecific. Favor new or increased pleural effusions since 04/17/2017 with atelectasis. Electronically Signed   By: Genevie Ann M.D.   On: 04/19/2017 07:39   Dg Chest Portable 1 View  Result Date: 04/17/2017 CLINICAL DATA:  Acute onset of fever.  Atrial fibrillation. EXAM: PORTABLE CHEST 1 VIEW COMPARISON:  Chest radiograph performed 01/31/2015, and CT of the chest performed 07/06/2015 FINDINGS: The lungs are  well-aerated. Left basilar airspace opacity could reflect pneumonia. There is no evidence of pleural effusion or pneumothorax. The cardiomediastinal silhouette is mildly enlarged. No acute osseous abnormalities are seen. IMPRESSION: 1. Left basilar airspace opacity could reflect pneumonia. 2. Mild cardiomegaly. Electronically Signed   By: Garald Balding M.D.   On: 04/17/2017 21:58   Dg Foot Complete Right  Result Date: 04/10/2017 CLINICAL DATA:  Ulcer of the right fourth toe. EXAM: RIGHT FOOT COMPLETE - 3+ VIEW COMPARISON:  None. FINDINGS: There is no fracture or dislocation. There is no periosteal reaction or bone destruction. There is no osteolysis. There is mild osteoarthritis of the first MTP joint and first TMT joint. There is partial ossification of the plantar fascia. There is peripheral vascular atherosclerotic disease. There is no focal soft tissue abnormality. IMPRESSION: No evidence of osteomyelitis of the right foot. Electronically Signed   By: Kathreen Devoid   On: 04/10/2017 08:33    Microbiology: Recent Results (from the past 240 hour(s))  Blood Culture (routine x 2)     Status: None   Collection Time: 04/17/17  7:54 PM  Result Value Ref Range Status   Specimen Description BLOOD LEFT ANTECUBITAL  Final   Special Requests   Final    BOTTLES DRAWN AEROBIC AND ANAEROBIC Blood Culture adequate volume   Culture   Final    NO GROWTH 5 DAYS Performed at [facility], [address], [place], [place] [address]    Report Status 04/22/2017 FINAL  Final  Blood Culture (routine x 2)     Status: None   Collection Time: 04/17/17  8:07 PM  Result Value Ref Range Status   Specimen Description BLOOD RIGHT ARM  Final   Special Requests   Final    BOTTLES DRAWN AEROBIC AND ANAEROBIC Blood Culture adequate volume   Culture   Final    NO GROWTH 5 DAYS Performed at [facility], [address] [place], [place] [address]    Report Status 04/22/2017 FINAL  Final  Urine culture      Status: None   Collection Time: 04/17/17  8:14 PM  Result Value Ref Range Status   Specimen Description URINE, CATHETERIZED  Final   Special Requests NONE  Final   Culture   Final    NO GROWTH Performed at [facility], [address], [place], [place] [address]    Report Status 04/19/2017 FINAL  Final     Labs: CBC: Recent Labs  Lab 04/21/17 0356 04/22/17 0247 04/23/17 0243 04/24/17 0300 04/25/17 0344  WBC 10.1 12.9* 8.9 10.9* 10.0  HGB 9.6* 10.5* 9.9* 9.6* 9.9*  HCT 31.1* 32.7* 32.3* 30.6* 31.7*  MCV 100.0 97.3 100.9* 99.7 99.7  PLT 163 227 225 205 141   Basic Metabolic Panel: Recent Labs  Lab 04/21/17 0837 04/22/17 0907 04/22/17 1519 04/23/17 0243 04/24/17 0300 04/25/17 0344  NA  --  144 143 143 141 143  K  --  2.7* 3.2* 3.9 3.6 3.6  CL  --  105 103 103 100* 101  CO2  --  '25 26 27 27 29  '$ GLUCOSE  --  141* 106* 141* 97 94  BUN  --  21* 22* 23* 28* 32*  CREATININE  --  1.02 1.06 1.13 1.25* 1.22  CALCIUM  --  7.9* 7.7* 8.0* 8.2* 8.4*  MG 2.2 2.0  --  1.9 1.7 1.9   Time spent: 35 minutes  Signed:  Berle Mull  [facility]  04/25/2017  , 12:12 PM

## 2017-04-25 NOTE — Telephone Encounter (Signed)
New message   TOC appt made on 05/02/17 at 9:00 with Rosaria Ferries per Angie at hospital

## 2017-04-25 NOTE — Progress Notes (Signed)
Called [name] [phone] and gave report to [facility]. [name] wife has all belongings. Removed IV site. Waiting on [facility] to transport him to the facility.

## 2017-04-25 NOTE — Progress Notes (Signed)
[facility] in to transport pt. Pola Corn, RN

## 2017-04-25 NOTE — Telephone Encounter (Signed)
Currently admitted.

## 2017-04-29 ENCOUNTER — Ambulatory Visit: Payer: Medicare Other | Admitting: Podiatry

## 2017-04-29 NOTE — Telephone Encounter (Signed)
**Note De-Identified  Obfuscation** The pt is to f/u with Rosaria Ferries, PA-c. Will forward to NL triage to contact pt.

## 2017-04-29 NOTE — Telephone Encounter (Signed)
Unable to reach pt or leave a message  

## 2017-04-30 ENCOUNTER — Emergency Department (HOSPITAL_COMMUNITY)
Admission: EM | Admit: 2017-04-30 | Discharge: 2017-04-30 | Disposition: A | Discharge status: Skilled Nursing Facility | Payer: Medicare Other | Attending: Emergency Medicine | Admitting: Emergency Medicine

## 2017-04-30 ENCOUNTER — Encounter (HOSPITAL_COMMUNITY): Payer: Self-pay

## 2017-04-30 DIAGNOSIS — Z79899 Other long term (current) drug therapy: Secondary | ICD-10-CM | POA: Diagnosis not present

## 2017-04-30 DIAGNOSIS — Z87891 Personal history of nicotine dependence: Secondary | ICD-10-CM | POA: Diagnosis not present

## 2017-04-30 DIAGNOSIS — R0689 Other abnormalities of breathing: Secondary | ICD-10-CM | POA: Diagnosis not present

## 2017-04-30 DIAGNOSIS — Z7901 Long term (current) use of anticoagulants: Secondary | ICD-10-CM | POA: Insufficient documentation

## 2017-04-30 DIAGNOSIS — Z0389 Encounter for observation for other suspected diseases and conditions ruled out: Secondary | ICD-10-CM | POA: Insufficient documentation

## 2017-04-30 DIAGNOSIS — N183 Chronic kidney disease, stage 3 (moderate): Secondary | ICD-10-CM | POA: Insufficient documentation

## 2017-04-30 DIAGNOSIS — I129 Hypertensive chronic kidney disease with stage 1 through stage 4 chronic kidney disease, or unspecified chronic kidney disease: Secondary | ICD-10-CM | POA: Diagnosis not present

## 2017-04-30 DIAGNOSIS — R7981 Abnormal blood-gas level: Secondary | ICD-10-CM

## 2017-04-30 DIAGNOSIS — E039 Hypothyroidism, unspecified: Secondary | ICD-10-CM | POA: Diagnosis not present

## 2017-04-30 MED ORDER — HYDROCODONE-ACETAMINOPHEN 5-325 MG PO TABS
1.0000 | ORAL_TABLET | Freq: Once | ORAL | Status: AC
  Administered 2017-04-30: 1 via ORAL
  Filled 2017-04-30: qty 1

## 2017-04-30 NOTE — ED Provider Notes (Signed)
[place] [facility] DEPT Provider Note   CSN: [id number] Arrival date & time: 04/30/17  1704     History   Chief Complaint No chief complaint on file.   HPI [name] is a 84 y.o. male.  HPI   [name] is a 84 y.o. male, with a history of pneumonia, GERD, HTN,, presenting to the ED from [facility] with reported SPO2 abnormality. Paperwork that was sent with the patient states "SPO2 unreadable."  EMS reports patient's room air SPO2 was 100% upon their arrival. Patient states he has been at this rehab facility since he was discharged from the hospital February 14.  He was discharged with supplemental O2, however, they have been weaning him off of it.  Today he was not on his supplemental O2, did a routine check of his SPO2, and the value was unreadable.  Patient denies shortness of breath, chest pain, cough, fever/chills, N/V/D, dizziness, diaphoresis, or any other complaints.    Past Medical History:  Diagnosis Date  . Abnormality of gait 09/22/2015  . Arthritis   . Bruises easily   . Cancer ([place])    skin CA removed from back  . Chronic insomnia 03/30/2015  . Complication of anesthesia    trouble waking up  . Constipation    from medications taken  . GERD (gastroesophageal reflux disease)   . Hypercholesteremia   . Hypertension   . Hypothyroidism   . Memory difficulty 09/22/2015  . Osteoarthritis   . Transient alteration of awareness 03/30/2015  . Vertigo    hx of    Patient Active Problem List   Diagnosis Date Noted  . CKD (chronic kidney disease) stage 3, GFR 30-59 ml/min (HCC)   . CAP (community acquired pneumonia) 04/17/2017  . Atrial fibrillation with RVR ([place]) 04/17/2017  . Hallux rigidus, right foot 03/10/2016  . Lumbosacral spondylosis without myelopathy 10/29/2015  . Memory difficulty 09/22/2015  . Abnormality of gait 09/22/2015  . Seizures ([place]) 07/06/2015  . Hyperglycemia 07/06/2015  . Leukocytosis 07/06/2015  .  Metabolic acidosis 09/73/5329  . Chronic pain 07/06/2015  . Chronic insomnia 03/30/2015  . Transient alteration of awareness 03/30/2015  . Abnormal liver function   . Sepsis ([place]) 01/31/2015  . Altered mental status 01/31/2015  . Essential hypertension 01/31/2015  . Constipation 01/31/2015  . Hypothyroidism 01/31/2015  . Acute encephalopathy 01/31/2015  . Bladder outlet obstruction 01/31/2015  . GERD (gastroesophageal reflux disease) 01/31/2015  . Chronic back pain 01/31/2015  . Sigmoid diverticulitis 01/31/2015  . Acute kidney injury ([place]) 01/31/2015    Past Surgical History:  Procedure Laterality Date  . BACK SURGERY    . CARDIAC CATHETERIZATION     Stent X 1  . CERVIX SURGERY    . CORONARY ANGIOPLASTY    . EYE SURGERY     Bilateral Cataract surgery   . HERNIA REPAIR    . I&D EXTREMITY Right 05/10/2015   Procedure: IRRIGATION AND DEBRIDEMENT EXTREMITY;  Surgeon: Leanora Cover, MD;  Location: [facility];  Service: Orthopedics;  Laterality: Right;  . KNEE ARTHROPLASTY     right knee X 2; left knee once  . LAMINECTOMY     X 6  . POSTERIOR CERVICAL FUSION/FORAMINOTOMY  01/28/2012   Procedure: POSTERIOR CERVICAL FUSION/FORAMINOTOMY LEVEL 3;  Surgeon: [name], MD;  Location: [facility];  Service: Neurosurgery;  Laterality: Left;  Posterior Cervical Five-Thoracic One Fusion, Arthrodesis with LEFT Cervical Seven-thoracic One Laminectomy, Foraminotomy and Resection of Synovial Cyst  .  POSTERIOR CERVICAL FUSION/FORAMINOTOMY N/A 01/29/2013   Procedure: POSTERIOR CERVICAL FUSION/FORAMINOTOMY LEVEL 1 and C2-5 Posteriolateral Arthrodesis;  Surgeon: [name], MD;  Location: [facility];  Service: Neurosurgery;  Laterality: N/A;  C2-C3 Laminectomy C2-C3 posterior cervical arthrodesis  . TONSILLECTOMY         Home Medications    Prior to Admission medications   Medication Sig Start Date End Date Taking? Authorizing Provider  alendronate (FOSAMAX) 70  MG tablet Take 70 mg by mouth every Sunday.  01/06/12   [provider]  apixaban (ELIQUIS) 5 MG TABS tablet Take 1 tablet (5 mg total) by mouth 2 (two) times daily. 04/25/17   Lavina Hamman, MD  calcium carbonate (OS-CAL) 600 MG TABS tablet Take 600 mg by mouth 2 (two) times daily with a meal.    [provider]  Cholecalciferol (VITAMIN D-3) 1000 units CAPS Take 1,000 Units by mouth 2 (two) times daily.    [provider]  Coenzyme Q10 (COQ10) 200 MG CAPS Take 200 mg by mouth at bedtime.    [provider]  cyanocobalamin 500 MCG tablet Take 500 mcg by mouth at bedtime.    [provider]  diltiazem (CARDIZEM CD) 300 MG 24 hr capsule Take 1 capsule (300 mg total) by mouth daily. 04/26/17   Lavina Hamman, MD  divalproex (DEPAKOTE) 250 MG DR tablet Take 3 tablets (750 mg total) by mouth every 12 (twelve) hours. Patient taking differently: Take 250-750 mg by mouth See admin instructions. 250 mg by mouth in the morning and 750 mg at bedtime 07/09/15   Robbie Lis, MD  docusate sodium (COLACE) 100 MG capsule Take 200 mg by mouth 2 (two) times daily.     [provider]  doxazosin (CARDURA) 4 MG tablet Take 1 tablet (4 mg total) by mouth daily. 04/26/17   Lavina Hamman, MD  furosemide (LASIX) 40 MG tablet Take 1 tablet (40 mg total) by mouth daily. 04/26/17   Lavina Hamman, MD  gabapentin (NEURONTIN) 300 MG capsule Take 300 mg by mouth 2 (two) times daily.    [provider]  HYDROcodone-acetaminophen (NORCO) 10-325 MG tablet Take 1 tablet by mouth every 6 (six) hours as needed for moderate pain. 04/25/17   Lavina Hamman, MD  levothyroxine (SYNTHROID, LEVOTHROID) 150 MCG tablet Take 150 mcg by mouth daily before breakfast. 12/27/11   [provider]  losartan (COZAAR) 100 MG tablet Take 1 tablet (100 mg total) by mouth daily. 02/08/15   Reyne Dumas, MD  metoprolol tartrate (LOPRESSOR) 50 MG tablet Take 1 tablet (50 mg total)  by mouth 2 (two) times daily. 04/25/17   Lavina Hamman, MD  Multiple Vitamin (MULTIVITAMIN WITH MINERALS) TABS tablet Take 1 tablet by mouth at bedtime.    [provider]  nitroGLYCERIN (NITROSTAT) 0.4 MG SL tablet Place 0.4 mg under the tongue every 5 (five) minutes as needed for chest pain. Reported on 07/20/2015    [provider]  omeprazole (PRILOSEC) 20 MG capsule Take 20 mg by mouth Daily. 12/23/11   [provider]  OVER THE COUNTER MEDICATION KerraCell: Use for packing/dressing changes on right leg    [provider]  OVER THE COUNTER MEDICATION ColActive Plus Collagen Matrix Dressing(s): Use for daily wound care on affected areas    [provider]  polyethylene glycol (MIRALAX / GLYCOLAX) packet Take 17 g by mouth daily as needed for moderate constipation. 04/25/17   Lavina Hamman,  MD  pravastatin (PRAVACHOL) 40 MG tablet Take 40 mg by mouth at bedtime.  10/29/14   [provider]  predniSONE (DELTASONE) 10 MG tablet Take 1 tablet (10 mg total) by mouth daily with breakfast. 04/28/17   Lavina Hamman, MD  QUEtiapine (SEROQUEL) 25 MG tablet Take 0.5-1 tablets (12.5-25 mg total) by mouth 2 (two) times daily. 12.5 mg in AM and 25 mg in PM 04/25/17   Lavina Hamman, MD  senna (SENOKOT) 8.6 MG tablet Take 1 tablet by mouth 2 (two) times daily.     [provider]  sodium chloride 0.9 % nebulizer solution 3 ml's in conjunction with daily wound care maintenance     [provider]  vitamin A 8000 UNIT capsule Take 8,000 Units by mouth 2 (two) times daily.    [provider]  vitamin C (ASCORBIC ACID) 500 MG tablet Take 500 mg by mouth every morning.    [provider]  ZETIA 10 MG tablet Take 5 mg by mouth at bedtime.  10/15/11   [provider]  zinc gluconate 50 MG tablet Take 50 mg by mouth every morning.    [provider]    Family History Family History  Problem Relation Age of  Onset  . Hypertension Mother   . Cancer Mother   . Kidney failure Father   . Heart disease Father     Social History Social History   Tobacco Use  . Smoking status: Former Research scientist (life sciences)  . Smokeless tobacco: Never Used  Substance Use Topics  . Alcohol use: No    Comment: rare  . Drug use: No     Allergies   Demerol [meperidine] and Keppra [levetiracetam]   Review of Systems Review of Systems  Constitutional: Negative for chills and fever.  Respiratory: Negative for cough and shortness of breath.   Cardiovascular: Negative for chest pain and leg swelling.  Gastrointestinal: Negative for abdominal pain, diarrhea, nausea and vomiting.  Neurological: Negative for dizziness.  All other systems reviewed and are negative.    Physical Exam Updated Vital Signs BP (!) 113/57 (BP Location: Right Arm)   Pulse (!) 53   Temp 98.1 F (36.7 C) (Oral)   Resp 18   SpO2 97%   Physical Exam  Constitutional: He appears well-developed and well-nourished. No distress.  HENT:  Head: Normocephalic and atraumatic.  Eyes: Conjunctivae are normal.  Neck: Neck supple.  Cardiovascular: Normal rate, regular rhythm, normal heart sounds and intact distal pulses.  Pulmonary/Chest: Effort normal and breath sounds normal. No respiratory distress.  Patient shows no increased work of breathing.  Speaks in full sentences without difficulty.  SPO2 97% on room air.  Abdominal: Soft. There is no tenderness. There is no guarding.  Musculoskeletal: He exhibits no edema.  Lymphadenopathy:    He has no cervical adenopathy.  Neurological: He is alert.  Skin: Skin is warm and dry. Capillary refill takes less than 2 seconds. He is not diaphoretic.  Psychiatric: He has a normal mood and affect. His behavior is normal.  Nursing note and vitals reviewed.    ED Treatments / Results  Labs (all labs ordered are listed, but only abnormal results are displayed) Labs Reviewed - No data to display  EKG  EKG  Interpretation None       Radiology No results found.  Procedures Procedures (including critical care time)  Medications Ordered in ED Medications - No data to display   Initial Impression / Assessment and Plan /  ED Course  I have reviewed the triage vital signs and the nursing notes.  Pertinent labs & imaging results that were available during my care of the patient were reviewed by me and considered in my medical decision making (see chart for details).  Clinical Course as of Apr 30 1952  Tue Apr 30, 2017  1946 Spoke with Lattie Haw, RN supervisor, at [facility].  States their pulse ox would not read and then when it did it read it was 99-100%. They were concerned because the pulse ox began to show a reading after they placed the patient on NRB. States patient was still talking without difficulty.   [SJ]    Clinical Course User Index [SJ] Lorayne Bender, PA-C    Patient presents from rehab facility for reported abnormal SPO2. Patient is nontoxic appearing, afebrile, not tachycardic, not tachypneic, not hypotensive, maintains excellent SPO2 on room air, and is in no apparent distress. PCP follow up as needed.  Return precautions discussed.  Patient and his wife at the bedside voice understanding of all instructions and are comfortable with discharge.   Findings and plan of care discussed with Addison Lank, MD. Dr. Leonette Monarch personally evaluated and examined this patient.  Vitals:   04/30/17 1735 04/30/17 1900  BP: (!) 113/57 121/60  Pulse: (!) 53 76  Resp: 18 16  Temp: 98.1 F (36.7 C)   TempSrc: Oral   SpO2: 97% 100%     Final Clinical Impressions(s) / ED Diagnoses   Final diagnoses:  Abnormal pulse oximetry    ED Discharge Orders    None       Lorayne Bender, PA-C 04/30/17 1956

## 2017-04-30 NOTE — ED Provider Notes (Signed)
Medical screening examination/treatment/procedure(s) were conducted as a shared visit with non-physician practitioner(s) and myself.  I personally evaluated the patient during the encounter. Briefly, the patient is a 84 y.o. male who is currently at a rehab facility after being discharged from the hospital for sepsis secondary to pneumonia requiring supplemental O2.  He presents today for possible hypoxia.  Patient was weaned off of oxygen recently.  There were closely monitoring his saturations.  Today they had difficulty obtaining a pulse ox on the patient and noted that they were able to get a reading after putting him on nonrebreather.  Patient did not have any respiratory distress during this period and was noted to be alert and responsive.  By EMS patient was satting well on room air.  On arrival here patient was alert and oriented.  Setting 100% on room air.  He was monitored for several hours without hypoxia.  Remained stable and appropriate for discharge. The patient is safe for discharge with strict return precautions.    EKG Interpretation None           Madine Sarr, Grayce Sessions, MD 05/01/17 [phone]

## 2017-04-30 NOTE — ED Notes (Signed)
[facility] called for pt 

## 2017-04-30 NOTE — ED Notes (Signed)
Pt wife signed for pt.

## 2017-04-30 NOTE — ED Notes (Addendum)
[name], [place] called the facility about the pt returning to the facility and they stated that was fine and they would accept him.

## 2017-04-30 NOTE — ED Triage Notes (Signed)
Patient coming from [facility] for rehab. Pt is here today with c/o low oxygen sat per staff at nursing facility. Pt was 100% RA with ems post oxygenation. Pt currently on room air. Hx Afib

## 2017-04-30 NOTE — Discharge Instructions (Signed)
Your oxygen was monitored and was found to be consistently 97-98% on room air.  Return to the ED as needed.

## 2017-05-01 NOTE — Telephone Encounter (Signed)
TOC call to patient.Spoke to wife.She stated husband is living at [facility].Stated he was taken to [facility] ED last night due to low O2 sat.Stated O2 sat was normal at ED 95 to 100 %.Advised to keep post hospital appointment with Rosaria Ferries PA 05/02/17 at 9:00 am.

## 2017-05-02 ENCOUNTER — Encounter: Payer: Self-pay | Admitting: Physician Assistant

## 2017-05-02 ENCOUNTER — Ambulatory Visit (INDEPENDENT_AMBULATORY_CARE_PROVIDER_SITE_OTHER): Payer: Medicare Other | Admitting: Physician Assistant

## 2017-05-02 VITALS — BP 78/42 | HR 80 | Ht 70.0 in | Wt 177.0 lb

## 2017-05-02 DIAGNOSIS — I952 Hypotension due to drugs: Secondary | ICD-10-CM | POA: Diagnosis not present

## 2017-05-02 DIAGNOSIS — I4819 Other persistent atrial fibrillation: Secondary | ICD-10-CM

## 2017-05-02 DIAGNOSIS — I5032 Chronic diastolic (congestive) heart failure: Secondary | ICD-10-CM | POA: Diagnosis not present

## 2017-05-02 DIAGNOSIS — I481 Persistent atrial fibrillation: Secondary | ICD-10-CM | POA: Diagnosis not present

## 2017-05-02 MED ORDER — FUROSEMIDE 40 MG PO TABS: 40.0000 mg | ORAL_TABLET | ORAL | 3 refills | Status: DC

## 2017-05-02 MED ORDER — METOPROLOL SUCCINATE ER 100 MG PO TB24: 100.0000 mg | ORAL_TABLET | Freq: Every day | ORAL | 3 refills | Status: DC

## 2017-05-02 NOTE — Patient Instructions (Addendum)
Medication Instructions:  CHANGE Lasix to Take 1 (40 mg) tablet twice a week on Mondays and Thursdays INCREASE Toprol XL to 100 mg once a day in the mornings TAKE Diltiazem daily at bedtime starting tomorrow 2.22.2019 DISCONTINUE Losartan  Labwork: None   Testing/Procedures: None   Follow-Up: Your physician recommends that you schedule a follow-up appointment in: [place] with Dr Oval Linsey  Any Other Special Instructions Will Be Listed Below (If Applicable). If you need a refill on your cardiac medications before your next appointment, please call your pharmacy.

## 2017-05-02 NOTE — Progress Notes (Addendum)
Cardiology Office Note   Date:  05/02/2017   ID:  DILLION STOWERS, DOB 09-12-1933, MRN [id number]  PCP:  [name], MD  Cardiologist: Dr. Oval Linsey, 04/25/2017 in hospital Rosaria Ferries, PA-C   Chief Complaint  Patient presents with  . Hospitalization Follow-up    History of Present Illness: [name] is a 84 y.o. male with a history of coronary stent, unknown location, HTN, HLD, GERD, OA, vertigo, chronic right leg wound, seizures, memory loss  Admitted 02/06-2/14/2019 for sepsis 2nd PNA, leg wound, cardiology saw for A. fib, RVR, on Eliquis, DC to a rehab facility ER visit 04/30/2017 for reported hypoxia.  However, in the ER O2 sats for 97% on room air.  No additional interventions needed  [name] presents for cardiology follow up.   He feels very weak. He remembers his BP was checked several times yesterday, they were finally happy with it.   He has had breakfast this am and has taken his medications.   He is not aware of the atrial fib, wonders if that is contributing to his weakness.  His wife states he is not eating very much, breakfast is ok but he only eats a few bites of other meals. This is partly because of being very sleepy. Wife is aware that pain rx can do this, but he is having significant pain>>needs it.  He denies nausea or vomiting.  He has not had any diarrhea.  He has not had any blood loss which she is aware, no dark or tarry stools.  No med changes since being at the facility, but during his recent admission, atenolol 25 mg >>metoprolol 50 mg bid, Cardizem is new, HCTZ 25 mg>>Lasix 40 mg, losartan no change  He is not having chest pain or SOB. His weakness makes him work hard to do anything and he has DOE with any exertion.   Past Medical History:  Diagnosis Date  . Abnormality of gait 09/22/2015  . Arthritis   . Bruises easily   . Cancer ([place])    skin CA removed from back  . Chronic insomnia 03/30/2015  . Complication of anesthesia     trouble waking up  . Constipation    from medications taken  . GERD (gastroesophageal reflux disease)   . Hypercholesteremia   . Hypertension   . Hypothyroidism   . Memory difficulty 09/22/2015  . Osteoarthritis   . Transient alteration of awareness 03/30/2015  . Vertigo    hx of    Past Surgical History:  Procedure Laterality Date  . BACK SURGERY    . CARDIAC CATHETERIZATION     Stent X 1  . CERVIX SURGERY    . CORONARY ANGIOPLASTY    . EYE SURGERY     Bilateral Cataract surgery   . HERNIA REPAIR    . I&D EXTREMITY Right 05/10/2015   Procedure: IRRIGATION AND DEBRIDEMENT EXTREMITY;  Surgeon: Leanora Cover, MD;  Location: [facility];  Service: Orthopedics;  Laterality: Right;  . KNEE ARTHROPLASTY     right knee X 2; left knee once  . LAMINECTOMY     X 6  . POSTERIOR CERVICAL FUSION/FORAMINOTOMY  01/28/2012   Procedure: POSTERIOR CERVICAL FUSION/FORAMINOTOMY LEVEL 3;  Surgeon: [name], MD;  Location: [facility];  Service: Neurosurgery;  Laterality: Left;  Posterior Cervical Five-Thoracic One Fusion, Arthrodesis with LEFT Cervical Seven-thoracic One Laminectomy, Foraminotomy and Resection of Synovial Cyst  . POSTERIOR CERVICAL FUSION/FORAMINOTOMY N/A 01/29/2013   Procedure:  POSTERIOR CERVICAL FUSION/FORAMINOTOMY LEVEL 1 and C2-5 Posteriolateral Arthrodesis;  Surgeon: [name], MD;  Location: [facility];  Service: Neurosurgery;  Laterality: N/A;  C2-C3 Laminectomy C2-C3 posterior cervical arthrodesis  . TONSILLECTOMY      Current Outpatient Medications  Medication Sig Dispense Refill  . alendronate (FOSAMAX) 70 MG tablet Take 70 mg by mouth every Sunday.     Marland Kitchen apixaban (ELIQUIS) 5 MG TABS tablet Take 1 tablet (5 mg total) by mouth 2 (two) times daily. 60 tablet 0  . calcium carbonate (OS-CAL) 600 MG TABS tablet Take 600 mg by mouth 2 (two) times daily with a meal.    . Cholecalciferol (VITAMIN D-3) 1000 units CAPS Take 1,000 Units by mouth 2  (two) times daily.    . Coenzyme Q10 (COQ10) 200 MG CAPS Take 200 mg by mouth at bedtime.    . cyanocobalamin 500 MCG tablet Take 500 mcg by mouth at bedtime.    Marland Kitchen diltiazem (CARDIZEM CD) 300 MG 24 hr capsule Take 1 capsule (300 mg total) by mouth daily. 30 capsule 0  . divalproex (DEPAKOTE) 250 MG DR tablet Take 3 tablets (750 mg total) by mouth every 12 (twelve) hours. (Patient taking differently: Take 250-750 mg by mouth See admin instructions. 250 mg by mouth in the morning and 750 mg at bedtime) 60 tablet 0  . docusate sodium (COLACE) 100 MG capsule Take 200 mg by mouth 2 (two) times daily.     Marland Kitchen doxazosin (CARDURA) 4 MG tablet Take 1 tablet (4 mg total) by mouth daily. 30 tablet 0  . furosemide (LASIX) 40 MG tablet Take 1 tablet (40 mg total) by mouth daily. 30 tablet 0  . gabapentin (NEURONTIN) 300 MG capsule Take 300 mg by mouth 2 (two) times daily.    Marland Kitchen HYDROcodone-acetaminophen (NORCO) 10-325 MG tablet Take 1 tablet by mouth every 6 (six) hours as needed for moderate pain. 8 tablet 0  . levothyroxine (SYNTHROID, LEVOTHROID) 150 MCG tablet Take 150 mcg by mouth daily before breakfast.    . lidocaine (LIDODERM) 5 % Place 1 patch onto the skin daily. Remove & Discard patch within 12 hours or as directed by MD    . losartan (COZAAR) 100 MG tablet Take 1 tablet (100 mg total) by mouth daily. 30 tablet 0  . metoprolol tartrate (LOPRESSOR) 50 MG tablet Take 1 tablet (50 mg total) by mouth 2 (two) times daily. 60 tablet 0  . Multiple Vitamin (MULTIVITAMIN WITH MINERALS) TABS tablet Take 1 tablet by mouth at bedtime.    . nitroGLYCERIN (NITROSTAT) 0.4 MG SL tablet Place 0.4 mg under the tongue every 5 (five) minutes as needed for chest pain. Reported on 07/20/2015    . omeprazole (PRILOSEC) 20 MG capsule Take 20 mg by mouth Daily.    Marland Kitchen OVER THE COUNTER MEDICATION KerraCell: Use for packing/dressing changes on right leg    . OVER THE COUNTER MEDICATION ColActive Plus Collagen Matrix Dressing(s): Use  for daily wound care on affected areas    . polyethylene glycol (MIRALAX / GLYCOLAX) packet Take 17 g by mouth daily as needed for moderate constipation. 14 each 0  . pravastatin (PRAVACHOL) 40 MG tablet Take 40 mg by mouth at bedtime.   2  . predniSONE (DELTASONE) 10 MG tablet Take 1 tablet (10 mg total) by mouth daily with breakfast.    . QUEtiapine (SEROQUEL) 25 MG tablet Take 0.5-1 tablets (12.5-25 mg total) by mouth 2 (two) times daily. 12.5 mg in AM  and 25 mg in PM 45 tablet 0  . senna (SENOKOT) 8.6 MG tablet Take 1 tablet by mouth 2 (two) times daily.     . sodium chloride 0.9 % nebulizer solution 3 ml's in conjunction with daily wound care maintenance     . vitamin A 8000 UNIT capsule Take 8,000 Units by mouth 2 (two) times daily.    . vitamin C (ASCORBIC ACID) 500 MG tablet Take 500 mg by mouth every morning.    Marland Kitchen ZETIA 10 MG tablet Take 5 mg by mouth at bedtime.     Marland Kitchen zinc gluconate 50 MG tablet Take 50 mg by mouth every morning.     No current facility-administered medications for this visit.     Allergies:   Demerol [meperidine] and Keppra [levetiracetam]    Social History:  The patient  reports that he has quit smoking. he has never used smokeless tobacco. He reports that he does not drink alcohol or use drugs.   Family History:  The patient's family history includes Cancer in his mother; Heart disease in his father; Hypertension in his mother; Kidney failure in his father.    ROS:  Please see the history of present illness. All other systems are reviewed and negative.    PHYSICAL EXAM: BP still low on recheck. VS:  BP (!) 78/42   Pulse 80   Ht 5\' 10"  (1.778 m)   BMI 25.37 kg/m  , BMI Body mass index is 25.37 kg/m. GEN: Well nourished, well developed, male in no acute distress  HEENT: normal for age  Neck: no JVD, no carotid bruit, no masses Cardiac: Irreg R&R; no murmur, no rubs, or gallops Respiratory: Rales R>L bilaterally, normal work of breathing GI: soft,  nontender, nondistended, + BS MS: no deformity or atrophy; no edema; distal pulses are 2+ in all 4 extremities   Skin: warm and dry, no rash Neuro:  Strength and sensation are intact Psych: euthymic mood, full affect   EKG:  EKG is ordered today. The ekg ordered today demonstrates atrial fib, HR 80. No acute ischemic changes, lateral complexes have decreased amplitude from 04/22/2017   Recent Labs: 04/17/2017: TSH 1.782 04/18/2017: ALT 70 04/25/2017: BUN 32; Creatinine, Ser 1.22; Hemoglobin 9.9; Magnesium 1.9; Platelets 225; Potassium 3.6; Sodium 143    Lipid Panel No results found for: CHOL, TRIG, HDL, CHOLHDL, VLDL, LDLCALC, LDLDIRECT   Wt Readings from Last 3 Encounters:  04/25/17 176 lb 12.9 oz (80.2 kg)  09/22/15 195 lb (88.5 kg)  07/20/15 194 lb 8 oz (88.2 kg)     Other studies Reviewed: Additional studies/ records that were reviewed today include: Office notes, hospital records and testing.  ASSESSMENT AND PLAN:  1.  Hypotension: Blood pressures from the facility are reviewed.  Systolic blood pressures recently have been 116 and 106.  However, it is not clear if these were taken before or after his medications were given.  He had multiple med changes during his recent hospitalization, with the addition of Cardizem, an increase in the beta-blocker and an increase in his diuretic, in addition to keeping him on the losartan he was already taking.  We were not able to get him up on the table to do orthostatic vital signs.  I discussed the situation with his wife, advising her that his blood pressure was low enough I could easily justify sending him to the emergency room.  She was just in the emergency room with him 2 days ago, and is very reluctant  to go back there unless there is no other option.    It is most likely that his medications are causing the hypotension.  Therefore, I will discontinue the losartan.  I will make the Lasix twice a week instead of daily, to allow his blood  pressure to be a little higher and because his p.o. intake is poor.  I advised his wife that I wanted to keep the Cardizem and metoprolol on board at current doses, because his heart rate is controlled.  However, I will change the metoprolol to the long-acting formulation, and change the Cardizem to bedtime to try and minimize cumulative effect of the drugs on his blood pressure.  She understands this.  She may get a blood pressure cuff that she can put on his wrist to check him whenever he feels badly to see if these adjustments will be enough.    2.  Chronic diastolic CHF: He is rnnot able to stand well enough to be weighed today.  His wife is to make sure he drinks between 1 and 1-1/2 L of all liquids daily.  She understands to watch for lower extremity edema and increasing shortness of breath on the decreased dose of Lasix.  The last weight we have was 172 pounds at the facility.  This is down from previous weights.  3.  Persistent atrial fibrillation: His heart rate is controlled today.  Continue Cardizem and metoprolol at current doses as long as his blood pressure will tolerate it.   Current medicines are reviewed at length with the patient today.  The patient has concerns regarding medicines.  Concerns were addressed  The following changes have been made: Discontinue losartan, decrease Lasix, change metoprolol to Toprol-XL, change Cardizem to bedtime  Labs/ tests ordered today include:  No orders of the defined types were placed in this encounter.   Disposition:   FU with Dr. Oval Linsey  Signed, Rosaria Ferries, PA-C  05/02/2017 9:26 AM    [place] Phone: [phone]; Fax: [phone]  This note was written with the assistance of speech recognition software. Please excuse any transcriptional errors.

## 2017-05-07 DIAGNOSIS — L97812 Non-pressure chronic ulcer of other part of right lower leg with fat layer exposed: Secondary | ICD-10-CM | POA: Diagnosis not present

## 2017-05-07 DIAGNOSIS — S41112A Laceration without foreign body of left upper arm, initial encounter: Secondary | ICD-10-CM | POA: Diagnosis not present

## 2017-05-07 DIAGNOSIS — R410 Disorientation, unspecified: Secondary | ICD-10-CM | POA: Diagnosis not present

## 2017-05-07 DIAGNOSIS — I739 Peripheral vascular disease, unspecified: Secondary | ICD-10-CM | POA: Diagnosis not present

## 2017-05-07 DIAGNOSIS — I890 Lymphedema, not elsewhere classified: Secondary | ICD-10-CM | POA: Diagnosis not present

## 2017-05-07 DIAGNOSIS — L89892 Pressure ulcer of other site, stage 2: Secondary | ICD-10-CM | POA: Diagnosis not present

## 2017-05-07 DIAGNOSIS — G4730 Sleep apnea, unspecified: Secondary | ICD-10-CM | POA: Diagnosis not present

## 2017-05-07 DIAGNOSIS — I2510 Atherosclerotic heart disease of native coronary artery without angina pectoris: Secondary | ICD-10-CM | POA: Diagnosis not present

## 2017-05-07 DIAGNOSIS — I10 Essential (primary) hypertension: Secondary | ICD-10-CM | POA: Diagnosis not present

## 2017-05-07 DIAGNOSIS — Z7952 Long term (current) use of systemic steroids: Secondary | ICD-10-CM | POA: Diagnosis not present

## 2017-05-07 DIAGNOSIS — X58XXXA Exposure to other specified factors, initial encounter: Secondary | ICD-10-CM | POA: Diagnosis not present

## 2017-05-07 DIAGNOSIS — L97512 Non-pressure chronic ulcer of other part of right foot with fat layer exposed: Secondary | ICD-10-CM | POA: Diagnosis not present

## 2017-05-07 DIAGNOSIS — L950 Livedoid vasculitis: Secondary | ICD-10-CM | POA: Diagnosis not present

## 2017-05-07 DIAGNOSIS — L97312 Non-pressure chronic ulcer of right ankle with fat layer exposed: Secondary | ICD-10-CM | POA: Diagnosis not present

## 2017-05-10 DIAGNOSIS — A419 Sepsis, unspecified organism: Secondary | ICD-10-CM

## 2017-05-10 HISTORY — DX: Sepsis, unspecified organism: A41.9

## 2017-05-21 ENCOUNTER — Other Ambulatory Visit (HOSPITAL_COMMUNITY)
Admission: RE | Admit: 2017-05-21 | Discharge: 2017-05-21 | Disposition: A | Discharge status: Home / Self Care | Payer: Medicare Other | Source: Other Acute Inpatient Hospital | Attending: Internal Medicine | Admitting: Internal Medicine

## 2017-05-21 ENCOUNTER — Encounter (HOSPITAL_BASED_OUTPATIENT_CLINIC_OR_DEPARTMENT_OTHER): Payer: Medicare Other | Attending: Internal Medicine

## 2017-05-21 DIAGNOSIS — G4730 Sleep apnea, unspecified: Secondary | ICD-10-CM | POA: Insufficient documentation

## 2017-05-21 DIAGNOSIS — I2510 Atherosclerotic heart disease of native coronary artery without angina pectoris: Secondary | ICD-10-CM | POA: Insufficient documentation

## 2017-05-21 DIAGNOSIS — L97812 Non-pressure chronic ulcer of other part of right lower leg with fat layer exposed: Secondary | ICD-10-CM | POA: Insufficient documentation

## 2017-05-21 DIAGNOSIS — I10 Essential (primary) hypertension: Secondary | ICD-10-CM | POA: Insufficient documentation

## 2017-05-21 DIAGNOSIS — L97524 Non-pressure chronic ulcer of other part of left foot with necrosis of bone: Secondary | ICD-10-CM | POA: Insufficient documentation

## 2017-05-21 DIAGNOSIS — L89894 Pressure ulcer of other site, stage 4: Secondary | ICD-10-CM | POA: Insufficient documentation

## 2017-05-21 DIAGNOSIS — L97312 Non-pressure chronic ulcer of right ankle with fat layer exposed: Secondary | ICD-10-CM | POA: Insufficient documentation

## 2017-05-21 DIAGNOSIS — L97512 Non-pressure chronic ulcer of other part of right foot with fat layer exposed: Secondary | ICD-10-CM | POA: Insufficient documentation

## 2017-05-23 ENCOUNTER — Inpatient Hospital Stay (HOSPITAL_COMMUNITY)
Admission: EM | Admit: 2017-05-23 | Discharge: 2017-06-01 | DRG: 871 | Disposition: A | Discharge status: Home Health | Payer: Medicare Other | Attending: Internal Medicine | Admitting: Internal Medicine

## 2017-05-23 ENCOUNTER — Emergency Department (HOSPITAL_COMMUNITY): Payer: Medicare Other

## 2017-05-23 DIAGNOSIS — Z981 Arthrodesis status: Secondary | ICD-10-CM

## 2017-05-23 DIAGNOSIS — I472 Ventricular tachycardia: Secondary | ICD-10-CM | POA: Diagnosis not present

## 2017-05-23 DIAGNOSIS — E860 Dehydration: Secondary | ICD-10-CM | POA: Diagnosis present

## 2017-05-23 DIAGNOSIS — R0600 Dyspnea, unspecified: Secondary | ICD-10-CM

## 2017-05-23 DIAGNOSIS — I5033 Acute on chronic diastolic (congestive) heart failure: Secondary | ICD-10-CM | POA: Diagnosis not present

## 2017-05-23 DIAGNOSIS — Z96653 Presence of artificial knee joint, bilateral: Secondary | ICD-10-CM | POA: Diagnosis present

## 2017-05-23 DIAGNOSIS — Z7901 Long term (current) use of anticoagulants: Secondary | ICD-10-CM

## 2017-05-23 DIAGNOSIS — A419 Sepsis, unspecified organism: Principal | ICD-10-CM | POA: Diagnosis present

## 2017-05-23 DIAGNOSIS — R410 Disorientation, unspecified: Secondary | ICD-10-CM

## 2017-05-23 DIAGNOSIS — E7800 Pure hypercholesterolemia, unspecified: Secondary | ICD-10-CM | POA: Diagnosis present

## 2017-05-23 DIAGNOSIS — G40909 Epilepsy, unspecified, not intractable, without status epilepticus: Secondary | ICD-10-CM

## 2017-05-23 DIAGNOSIS — N400 Enlarged prostate without lower urinary tract symptoms: Secondary | ICD-10-CM | POA: Diagnosis present

## 2017-05-23 DIAGNOSIS — Y95 Nosocomial condition: Secondary | ICD-10-CM | POA: Diagnosis present

## 2017-05-23 DIAGNOSIS — I481 Persistent atrial fibrillation: Secondary | ICD-10-CM | POA: Diagnosis present

## 2017-05-23 DIAGNOSIS — I130 Hypertensive heart and chronic kidney disease with heart failure and stage 1 through stage 4 chronic kidney disease, or unspecified chronic kidney disease: Secondary | ICD-10-CM | POA: Diagnosis present

## 2017-05-23 DIAGNOSIS — J181 Lobar pneumonia, unspecified organism: Secondary | ICD-10-CM | POA: Diagnosis present

## 2017-05-23 DIAGNOSIS — Z9861 Coronary angioplasty status: Secondary | ICD-10-CM

## 2017-05-23 DIAGNOSIS — G4089 Other seizures: Secondary | ICD-10-CM | POA: Diagnosis present

## 2017-05-23 DIAGNOSIS — R197 Diarrhea, unspecified: Secondary | ICD-10-CM | POA: Diagnosis present

## 2017-05-23 DIAGNOSIS — J189 Pneumonia, unspecified organism: Secondary | ICD-10-CM

## 2017-05-23 DIAGNOSIS — Z87891 Personal history of nicotine dependence: Secondary | ICD-10-CM

## 2017-05-23 DIAGNOSIS — F0390 Unspecified dementia without behavioral disturbance: Secondary | ICD-10-CM | POA: Diagnosis present

## 2017-05-23 DIAGNOSIS — Z85828 Personal history of other malignant neoplasm of skin: Secondary | ICD-10-CM

## 2017-05-23 DIAGNOSIS — G9341 Metabolic encephalopathy: Secondary | ICD-10-CM | POA: Diagnosis present

## 2017-05-23 DIAGNOSIS — E876 Hypokalemia: Secondary | ICD-10-CM | POA: Diagnosis present

## 2017-05-23 DIAGNOSIS — K219 Gastro-esophageal reflux disease without esophagitis: Secondary | ICD-10-CM | POA: Diagnosis present

## 2017-05-23 DIAGNOSIS — E872 Acidosis: Secondary | ICD-10-CM | POA: Diagnosis present

## 2017-05-23 DIAGNOSIS — E039 Hypothyroidism, unspecified: Secondary | ICD-10-CM | POA: Diagnosis present

## 2017-05-23 DIAGNOSIS — N183 Chronic kidney disease, stage 3 (moderate): Secondary | ICD-10-CM | POA: Diagnosis present

## 2017-05-23 DIAGNOSIS — N179 Acute kidney failure, unspecified: Secondary | ICD-10-CM | POA: Diagnosis present

## 2017-05-23 DIAGNOSIS — L97819 Non-pressure chronic ulcer of other part of right lower leg with unspecified severity: Secondary | ICD-10-CM | POA: Diagnosis present

## 2017-05-23 DIAGNOSIS — D509 Iron deficiency anemia, unspecified: Secondary | ICD-10-CM | POA: Diagnosis present

## 2017-05-23 DIAGNOSIS — Z7189 Other specified counseling: Secondary | ICD-10-CM

## 2017-05-23 DIAGNOSIS — I2510 Atherosclerotic heart disease of native coronary artery without angina pectoris: Secondary | ICD-10-CM | POA: Diagnosis present

## 2017-05-23 DIAGNOSIS — G9349 Other encephalopathy: Secondary | ICD-10-CM | POA: Diagnosis present

## 2017-05-23 DIAGNOSIS — Z79899 Other long term (current) drug therapy: Secondary | ICD-10-CM

## 2017-05-23 DIAGNOSIS — L97529 Non-pressure chronic ulcer of other part of left foot with unspecified severity: Secondary | ICD-10-CM | POA: Diagnosis present

## 2017-05-23 DIAGNOSIS — J9601 Acute respiratory failure with hypoxia: Secondary | ICD-10-CM | POA: Diagnosis not present

## 2017-05-23 DIAGNOSIS — Z515 Encounter for palliative care: Secondary | ICD-10-CM

## 2017-05-23 HISTORY — DX: Unspecified convulsions: R56.9

## 2017-05-23 HISTORY — DX: Sepsis, unspecified organism: A41.9

## 2017-05-23 HISTORY — DX: Unspecified atrial fibrillation: I48.91

## 2017-05-23 HISTORY — DX: Atherosclerotic heart disease of native coronary artery without angina pectoris: I25.10

## 2017-05-23 LAB — VALPROIC ACID LEVEL: Valproic Acid Lvl: 73 ug/mL (ref 50.0–100.0)

## 2017-05-23 LAB — COMPREHENSIVE METABOLIC PANEL
ALT: 17 U/L (ref 17–63)
AST: 21 U/L (ref 15–41)
Albumin: 2.9 g/dL — ABNORMAL LOW (ref 3.5–5.0)
Alkaline Phosphatase: 50 U/L (ref 38–126)
Anion gap: 10 (ref 5–15)
BUN: 28 mg/dL — ABNORMAL HIGH (ref 6–20)
CO2: 23 mmol/L (ref 22–32)
Calcium: 7.9 mg/dL — ABNORMAL LOW (ref 8.9–10.3)
Chloride: 110 mmol/L (ref 101–111)
Creatinine, Ser: 1.41 mg/dL — ABNORMAL HIGH (ref 0.61–1.24)
GFR calc Af Amer: 52 mL/min — ABNORMAL LOW (ref >60)
GFR calc non Af Amer: 45 mL/min — ABNORMAL LOW (ref >60)
Glucose, Bld: 100 mg/dL — ABNORMAL HIGH (ref 65–99)
Potassium: 3.3 mmol/L — ABNORMAL LOW (ref 3.5–5.1)
Sodium: 143 mmol/L (ref 135–145)
Total Bilirubin: 0.7 mg/dL (ref 0.3–1.2)
Total Protein: 5 g/dL — ABNORMAL LOW (ref 6.5–8.1)

## 2017-05-23 LAB — I-STAT CG4 LACTIC ACID, ED: Lactic Acid, Venous: 1.39 mmol/L (ref 0.5–1.9)

## 2017-05-23 LAB — PROTIME-INR
INR: 1.54
Prothrombin Time: 18.3 seconds — ABNORMAL HIGH (ref 11.4–15.2)

## 2017-05-23 LAB — CBG MONITORING, ED: Glucose-Capillary: 91 mg/dL (ref 65–99)

## 2017-05-23 NOTE — ED Notes (Signed)
Wife at bedside reports seeing pt normal around 2 pm. Wife reports coming back 2 hours later to find pt unresponsive, unable to ambulate. She also reports pt has coughing when eating. Pt has sevral wounds to R foot and L big toe, redness and warmth surrounding the area

## 2017-05-23 NOTE — ED Notes (Signed)
ED Provider at bedside. 

## 2017-05-23 NOTE — ED Triage Notes (Addendum)
Per [facility], Pt from [facility]. Family and staff report pt was being treated for pneumonia x1 month ago. Staff reported pt had temp of 100.9, pt was given 650 mg of tylenol by [facility].Today pt became more lethargic and not responding as well. Pt normally A&O x4 Pt alert and oriented to self. Pt complaining of pain to R leg and abdomen. Pt unable to really answer many questions, wife is on the way. Pt received 500 ml NS by EMS

## 2017-05-23 NOTE — ED Provider Notes (Signed)
[facility] EMERGENCY DEPARTMENT Provider Note   CSN: [id number] Arrival date & time: 05/23/17  2100     History   Chief Complaint Chief Complaint  Patient presents with  . Altered Mental Status  . Fever    HPI [name] is a 84 y.o. male.  Patient with history of seizures on Depakote, chronic lower extremity wounds, atrial fibrillation on Eliquis, recent admission to the hospital and discharged for sepsis on 04/25/2017, currently at rehab with anticipation of returning home very shortly --presents the emergency department with altered mental status and fever.  Symptoms started around lunchtime today.  Patient had been progressing and ambulatory with a walker.  He was verbal and responsive.  He became much less responsive and unable to ambulate this afternoon.  Level 5 caveat due to altered mental status.  Patient's family does note that he has been receiving increased Depakote dose since hospitalization.  Fever treated at rehab with Tylenol.  Undergoing wound care for lower extremity wounds.  Wife notes that patient left great toe wound has been more red and inflamed recently.       Past Medical History:  Diagnosis Date  . Abnormality of gait 09/22/2015  . Arthritis   . Bruises easily   . Cancer ([place])    skin CA removed from back  . Chronic insomnia 03/30/2015  . Complication of anesthesia    trouble waking up  . Constipation    from medications taken  . GERD (gastroesophageal reflux disease)   . Hypercholesteremia   . Hypertension   . Hypothyroidism   . Memory difficulty 09/22/2015  . Osteoarthritis   . Transient alteration of awareness 03/30/2015  . Vertigo    hx of    Patient Active Problem List   Diagnosis Date Noted  . CKD (chronic kidney disease) stage 3, GFR 30-59 ml/min (HCC)   . CAP (community acquired pneumonia) 04/17/2017  . Atrial fibrillation with RVR ([place]) 04/17/2017  . Hallux rigidus, right foot 03/10/2016  . Lumbosacral  spondylosis without myelopathy 10/29/2015  . Memory difficulty 09/22/2015  . Abnormality of gait 09/22/2015  . Seizures ([place]) 07/06/2015  . Hyperglycemia 07/06/2015  . Leukocytosis 07/06/2015  . Metabolic acidosis 65/68/1275  . Chronic pain 07/06/2015  . Chronic insomnia 03/30/2015  . Transient alteration of awareness 03/30/2015  . Abnormal liver function   . Sepsis ([place]) 01/31/2015  . Altered mental status 01/31/2015  . Essential hypertension 01/31/2015  . Constipation 01/31/2015  . Hypothyroidism 01/31/2015  . Acute encephalopathy 01/31/2015  . Bladder outlet obstruction 01/31/2015  . GERD (gastroesophageal reflux disease) 01/31/2015  . Chronic back pain 01/31/2015  . Sigmoid diverticulitis 01/31/2015  . Acute kidney injury ([place]) 01/31/2015    Past Surgical History:  Procedure Laterality Date  . BACK SURGERY    . CARDIAC CATHETERIZATION     Stent X 1  . CERVIX SURGERY    . CORONARY ANGIOPLASTY    . EYE SURGERY     Bilateral Cataract surgery   . HERNIA REPAIR    . I&D EXTREMITY Right 05/10/2015   Procedure: IRRIGATION AND DEBRIDEMENT EXTREMITY;  Surgeon: Leanora Cover, MD;  Location: [facility];  Service: Orthopedics;  Laterality: Right;  . KNEE ARTHROPLASTY     right knee X 2; left knee once  . LAMINECTOMY     X 6  . POSTERIOR CERVICAL FUSION/FORAMINOTOMY  01/28/2012   Procedure: POSTERIOR CERVICAL FUSION/FORAMINOTOMY LEVEL 3;  Surgeon: [name], MD;  Location: [facility]  [facility];  Service: Neurosurgery;  Laterality: Left;  Posterior Cervical Five-Thoracic One Fusion, Arthrodesis with LEFT Cervical Seven-thoracic One Laminectomy, Foraminotomy and Resection of Synovial Cyst  . POSTERIOR CERVICAL FUSION/FORAMINOTOMY N/A 01/29/2013   Procedure: POSTERIOR CERVICAL FUSION/FORAMINOTOMY LEVEL 1 and C2-5 Posteriolateral Arthrodesis;  Surgeon: [name], MD;  Location: [facility];  Service: Neurosurgery;  Laterality: N/A;  C2-C3 Laminectomy C2-C3  posterior cervical arthrodesis  . TONSILLECTOMY         Home Medications    Prior to Admission medications   Medication Sig Start Date End Date Taking? Authorizing Provider  alendronate (FOSAMAX) 70 MG tablet Take 70 mg by mouth every Sunday.  01/06/12   [provider]  apixaban (ELIQUIS) 5 MG TABS tablet Take 1 tablet (5 mg total) by mouth 2 (two) times daily. 04/25/17   Lavina Hamman, MD  calcium carbonate (OS-CAL) 600 MG TABS tablet Take 600 mg by mouth 2 (two) times daily with a meal.    [provider]  Cholecalciferol (VITAMIN D-3) 1000 units CAPS Take 1,000 Units by mouth 2 (two) times daily.    [provider]  Coenzyme Q10 (COQ10) 200 MG CAPS Take 200 mg by mouth at bedtime.    [provider]  cyanocobalamin 500 MCG tablet Take 500 mcg by mouth at bedtime.    [provider]  diltiazem (CARDIZEM CD) 300 MG 24 hr capsule Take 1 capsule (300 mg total) by mouth daily. 04/26/17   Lavina Hamman, MD  divalproex (DEPAKOTE) 250 MG DR tablet Take 3 tablets (750 mg total) by mouth every 12 (twelve) hours. Patient taking differently: Take 250-750 mg by mouth See admin instructions. 250 mg by mouth in the morning and 750 mg at bedtime 07/09/15   Robbie Lis, MD  docusate sodium (COLACE) 100 MG capsule Take 200 mg by mouth 2 (two) times daily.     [provider]  doxazosin (CARDURA) 4 MG tablet Take 1 tablet (4 mg total) by mouth daily. 04/26/17   Lavina Hamman, MD  furosemide (LASIX) 40 MG tablet Take 1 tablet (40 mg total) by mouth 2 (two) times a week. ONLY 05/02/17   Barrett, Evelene Croon, PA-C  gabapentin (NEURONTIN) 300 MG capsule Take 300 mg by mouth 2 (two) times daily.    [provider]  HYDROcodone-acetaminophen (NORCO) 10-325 MG tablet Take 1 tablet by mouth every 6 (six) hours as needed for moderate pain. 04/25/17   Lavina Hamman, MD  levothyroxine (SYNTHROID, LEVOTHROID) 150 MCG tablet Take 150 mcg by mouth daily  before breakfast. 12/27/11   [provider]  lidocaine (LIDODERM) 5 % Place 1 patch onto the skin daily. Remove & Discard patch within 12 hours or as directed by MD    [provider]  metoprolol succinate (TOPROL-XL) 100 MG 24 hr tablet Take 1 tablet (100 mg total) by mouth daily. Take with or immediately following a meal. 05/02/17 08/30/17  Barrett, Evelene Croon, PA-C  metoprolol tartrate (LOPRESSOR) 50 MG tablet Take 1 tablet (50 mg total) by mouth 2 (two) times daily. 04/25/17   Lavina Hamman, MD  Multiple Vitamin (MULTIVITAMIN WITH MINERALS) TABS tablet Take 1 tablet by mouth at bedtime.    [provider]  nitroGLYCERIN (NITROSTAT) 0.4 MG SL tablet Place 0.4 mg under the tongue every 5 (five) minutes as needed for chest pain. Reported on 07/20/2015    [provider]  omeprazole (PRILOSEC) 20 MG capsule Take 20 mg by mouth  Daily. 12/23/11   [provider]  OVER THE COUNTER MEDICATION KerraCell: Use for packing/dressing changes on right leg    [provider]  OVER THE COUNTER MEDICATION ColActive Plus Collagen Matrix Dressing(s): Use for daily wound care on affected areas    [provider]  polyethylene glycol (MIRALAX / GLYCOLAX) packet Take 17 g by mouth daily as needed for moderate constipation. 04/25/17   Lavina Hamman, MD  pravastatin (PRAVACHOL) 40 MG tablet Take 40 mg by mouth at bedtime.  10/29/14   [provider]  predniSONE (DELTASONE) 10 MG tablet Take 1 tablet (10 mg total) by mouth daily with breakfast. 04/28/17   Lavina Hamman, MD  QUEtiapine (SEROQUEL) 25 MG tablet Take 0.5-1 tablets (12.5-25 mg total) by mouth 2 (two) times daily. 12.5 mg in AM and 25 mg in PM 04/25/17   Lavina Hamman, MD  senna (SENOKOT) 8.6 MG tablet Take 1 tablet by mouth 2 (two) times daily.     [provider]  sodium chloride 0.9 % nebulizer solution 3 ml's in conjunction with daily wound care maintenance     [provider]  vitamin A 8000 UNIT capsule Take 8,000 Units by mouth 2 (two) times daily.    [provider]  vitamin C (ASCORBIC ACID) 500 MG tablet Take 500 mg by mouth every morning.    [provider]  ZETIA 10 MG tablet Take 5 mg by mouth at bedtime.  10/15/11   [provider]  zinc gluconate 50 MG tablet Take 50 mg by mouth every morning.    [provider]    Family History Family History  Problem Relation Age of Onset  . Hypertension Mother   . Cancer Mother   . Kidney failure Father   . Heart disease Father     Social History Social History   Tobacco Use  . Smoking status: Former Research scientist (life sciences)  . Smokeless tobacco: Never Used  Substance Use Topics  . Alcohol use: No    Comment: rare  . Drug use: No     Allergies   Demerol [meperidine] and Keppra [levetiracetam]   Review of Systems Review of Systems  Unable to perform ROS: Mental status change     Physical Exam Updated Vital Signs BP (!) 107/59   Pulse 93   Temp 98.7 F (37.1 C)   Resp 17   SpO2 100%   Physical Exam  Constitutional: He appears well-developed and well-nourished.  HENT:  Head: Normocephalic and atraumatic.  Mucous membranes somewhat dry.   Eyes: Conjunctivae are normal. Right eye exhibits no discharge. Left eye exhibits no discharge.  Neck: Normal range of motion. Neck supple.  Cardiovascular: Normal rate and normal heart sounds. An irregularly irregular rhythm present.  Pulmonary/Chest: Effort normal and breath sounds normal. No respiratory distress. He has no wheezes. He has no rales.  Abdominal: Soft. There is no tenderness. There is no rebound and no guarding.  Denies pain  Musculoskeletal: He exhibits no tenderness.  Two wounds R ankle, R toes, L great toe. L great toe wound with scant drainage and surrounding redness. R toe wounds look like they are healing well.   Neurological:  Patient awakes to voice and follows basic commands but seems  confused. For example, will stick out tongue when I ask, but when I ask him to smile he continues to stick out tongue.   Skin: Skin is warm and dry.  Psychiatric: He has a normal mood and  affect.  Nursing note and vitals reviewed.    ED Treatments / Results  Labs (all labs ordered are listed, but only abnormal results are displayed) Labs Reviewed  COMPREHENSIVE METABOLIC PANEL - Abnormal; Notable for the following components:      Result Value   Potassium 3.3 (*)    Glucose, Bld 100 (*)    BUN 28 (*)    Creatinine, Ser 1.41 (*)    Calcium 7.9 (*)    Total Protein 5.0 (*)    Albumin 2.9 (*)    GFR calc non Af Amer 45 (*)    GFR calc Af Amer 52 (*)    All other components within normal limits  CBC WITH DIFFERENTIAL/PLATELET - Abnormal; Notable for the following components:   WBC 11.0 (*)    RBC 2.97 (*)    Hemoglobin 9.3 (*)    HCT 29.9 (*)    MCV 100.7 (*)    Neutro Abs 8.7 (*)    All other components within normal limits  PROTIME-INR - Abnormal; Notable for the following components:   Prothrombin Time 18.3 (*)    All other components within normal limits  URINALYSIS, ROUTINE W REFLEX MICROSCOPIC - Abnormal; Notable for the following components:   Color, Urine STRAW (*)    All other components within normal limits  CULTURE, BLOOD (ROUTINE X 2)  CULTURE, BLOOD (ROUTINE X 2)  URINE CULTURE  VALPROIC ACID LEVEL  CBG MONITORING, ED  I-STAT CG4 LACTIC ACID, ED  I-STAT CG4 LACTIC ACID, ED    EKG  EKG Interpretation  Date/Time:  Thursday May 23 2017 21:34:51 EDT Ventricular Rate:  99 PR Interval:    QRS Duration: 91 QT Interval:  391 QTC Calculation: 502 R Axis:   27 Text Interpretation:  Atrial fibrillation Low voltage, extremity leads Prolonged QT interval Confirmed by Fredia Sorrow [phone]) on 05/23/2017 10:27:36 PM       Radiology Dg Chest 2 View  Result Date: 05/24/2017 CLINICAL DATA:  84 year old male with fever.  Pneumonia. EXAM: CHEST - 2 VIEW  COMPARISON:  Chest radiograph dated 04/23/2017 FINDINGS: There is shallow inspiration. Left lung base density may represent atelectasis versus infiltrate. Clinical correlation is recommended. Small left pleural effusion may be present. Interval clearing of the right lung base densities compared to prior radiograph. There is no pneumothorax. Stable cardiomegaly. No acute osseous pathology. IMPRESSION: Left lung base atelectasis versus infiltrate. Interval clearing of the right lung base densities compared to prior radiograph. Electronically Signed   By: Anner Crete M.D.   On: 05/24/2017 00:03   Dg Ankle Complete Right  Result Date: 05/24/2017 CLINICAL DATA:  Chronic right ankle wound.  Fever. EXAM: RIGHT ANKLE - COMPLETE 3+ VIEW COMPARISON:  Right ankle radiographs performed 04/09/2017 FINDINGS: There is no evidence of fracture or dislocation. No osseous erosions are seen. The ankle mortise is intact; the interosseous space is within normal limits. No talar tilt or subluxation is seen. Calcification is noted along the plantar fascia. A plantar calcaneal spur is noted. Scattered vascular calcifications are seen. The joint spaces are preserved. Soft tissue air is noted along the medial aspect of the lower leg, reflecting a soft tissue ulceration. No radiopaque foreign bodies are seen. IMPRESSION: 1. No osseous erosions seen. 2. No radiopaque foreign bodies seen. 3. Scattered vascular calcifications noted. Electronically Signed   By: Garald Balding M.D.   On: 05/24/2017 00:07   Dg Foot Complete Left  Result Date: 05/24/2017 CLINICAL DATA:  Acute onset of lethargy  and fever. Chronic left great toe wound. EXAM: LEFT FOOT - COMPLETE 3+ VIEW COMPARISON:  Left foot radiographs performed 06/13/2016 FINDINGS: There is no evidence of fracture or dislocation. No osseous erosions are seen. The joint spaces are preserved. There is no evidence of talar subluxation; the subtalar joint is unremarkable in appearance.  Plantar and posterior calcaneal spurs are seen. The left great toe wound is not well characterized. No radiopaque foreign bodies are identified. IMPRESSION: No evidence of fracture or dislocation.  No osseous erosions seen. Electronically Signed   By: Garald Balding M.D.   On: 05/24/2017 00:03    Procedures Procedures (including critical care time)  Medications Ordered in ED Medications  ceFEPIme (MAXIPIME) 2 g in sodium chloride 0.9 % 100 mL IVPB (2 g Intravenous New Bag/Given 05/24/17 0102)  vancomycin (VANCOCIN) IVPB 1000 mg/200 mL premix (1,000 mg Intravenous New Bag/Given 05/24/17 0105)  0.9 %  sodium chloride infusion (not administered)     Initial Impression / Assessment and Plan / ED Course  I have reviewed the triage vital signs and the nursing notes.  Pertinent labs & imaging results that were available during my care of the patient were reviewed by me and considered in my medical decision making (see chart for details).     Patient seen and examined. Work-up initiated. Medications ordered.   Vital signs reviewed and are as follows: BP 114/62   Pulse 100   Temp 98.7 F (37.1 C)   Resp (!) 24   SpO2 100%    CXR shows possible LLL PNA. [place] clear. No osteo on lower extremity films. Will treat with vancomycin and cefepime.   1:29 AM Wife updated. Spoke with Dr. Tamala Julian who will see.    Final Clinical Impressions(s) / ED Diagnoses   Final diagnoses:  Pneumonia of left lower lobe due to infectious organism ([place])  Confusion   Admit for altered mental status, suspect PNA.   ED Discharge Orders    None       Carlisle Cater, Hershal Coria 05/24/17 0131    Quintella Reichert, MD 05/28/17 1208

## 2017-05-24 DIAGNOSIS — K219 Gastro-esophageal reflux disease without esophagitis: Secondary | ICD-10-CM | POA: Diagnosis present

## 2017-05-24 DIAGNOSIS — G40909 Epilepsy, unspecified, not intractable, without status epilepticus: Secondary | ICD-10-CM

## 2017-05-24 DIAGNOSIS — E038 Other specified hypothyroidism: Secondary | ICD-10-CM

## 2017-05-24 DIAGNOSIS — R197 Diarrhea, unspecified: Secondary | ICD-10-CM | POA: Diagnosis present

## 2017-05-24 DIAGNOSIS — R410 Disorientation, unspecified: Secondary | ICD-10-CM

## 2017-05-24 DIAGNOSIS — R748 Abnormal levels of other serum enzymes: Secondary | ICD-10-CM | POA: Diagnosis not present

## 2017-05-24 DIAGNOSIS — F0390 Unspecified dementia without behavioral disturbance: Secondary | ICD-10-CM | POA: Diagnosis present

## 2017-05-24 DIAGNOSIS — A419 Sepsis, unspecified organism: Secondary | ICD-10-CM | POA: Diagnosis present

## 2017-05-24 DIAGNOSIS — G9349 Other encephalopathy: Secondary | ICD-10-CM | POA: Diagnosis present

## 2017-05-24 DIAGNOSIS — G4089 Other seizures: Secondary | ICD-10-CM | POA: Diagnosis present

## 2017-05-24 DIAGNOSIS — E872 Acidosis: Secondary | ICD-10-CM | POA: Diagnosis present

## 2017-05-24 DIAGNOSIS — G9341 Metabolic encephalopathy: Secondary | ICD-10-CM | POA: Diagnosis present

## 2017-05-24 DIAGNOSIS — Z96653 Presence of artificial knee joint, bilateral: Secondary | ICD-10-CM | POA: Diagnosis present

## 2017-05-24 DIAGNOSIS — N179 Acute kidney failure, unspecified: Secondary | ICD-10-CM | POA: Diagnosis present

## 2017-05-24 DIAGNOSIS — L97819 Non-pressure chronic ulcer of other part of right lower leg with unspecified severity: Secondary | ICD-10-CM | POA: Diagnosis present

## 2017-05-24 DIAGNOSIS — I472 Ventricular tachycardia: Secondary | ICD-10-CM | POA: Diagnosis not present

## 2017-05-24 DIAGNOSIS — Y95 Nosocomial condition: Secondary | ICD-10-CM | POA: Diagnosis present

## 2017-05-24 DIAGNOSIS — E039 Hypothyroidism, unspecified: Secondary | ICD-10-CM | POA: Diagnosis present

## 2017-05-24 DIAGNOSIS — D509 Iron deficiency anemia, unspecified: Secondary | ICD-10-CM | POA: Diagnosis present

## 2017-05-24 DIAGNOSIS — Z7189 Other specified counseling: Secondary | ICD-10-CM | POA: Diagnosis not present

## 2017-05-24 DIAGNOSIS — J189 Pneumonia, unspecified organism: Secondary | ICD-10-CM | POA: Diagnosis not present

## 2017-05-24 DIAGNOSIS — N400 Enlarged prostate without lower urinary tract symptoms: Secondary | ICD-10-CM | POA: Diagnosis present

## 2017-05-24 DIAGNOSIS — Z85828 Personal history of other malignant neoplasm of skin: Secondary | ICD-10-CM | POA: Diagnosis not present

## 2017-05-24 DIAGNOSIS — E7800 Pure hypercholesterolemia, unspecified: Secondary | ICD-10-CM | POA: Diagnosis present

## 2017-05-24 DIAGNOSIS — E876 Hypokalemia: Secondary | ICD-10-CM | POA: Diagnosis present

## 2017-05-24 DIAGNOSIS — N183 Chronic kidney disease, stage 3 (moderate): Secondary | ICD-10-CM | POA: Diagnosis present

## 2017-05-24 DIAGNOSIS — J181 Lobar pneumonia, unspecified organism: Secondary | ICD-10-CM | POA: Diagnosis present

## 2017-05-24 DIAGNOSIS — I4891 Unspecified atrial fibrillation: Secondary | ICD-10-CM | POA: Diagnosis not present

## 2017-05-24 DIAGNOSIS — I481 Persistent atrial fibrillation: Secondary | ICD-10-CM | POA: Diagnosis present

## 2017-05-24 DIAGNOSIS — I5033 Acute on chronic diastolic (congestive) heart failure: Secondary | ICD-10-CM | POA: Diagnosis not present

## 2017-05-24 DIAGNOSIS — R0600 Dyspnea, unspecified: Secondary | ICD-10-CM | POA: Diagnosis not present

## 2017-05-24 DIAGNOSIS — I130 Hypertensive heart and chronic kidney disease with heart failure and stage 1 through stage 4 chronic kidney disease, or unspecified chronic kidney disease: Secondary | ICD-10-CM | POA: Diagnosis present

## 2017-05-24 DIAGNOSIS — Z515 Encounter for palliative care: Secondary | ICD-10-CM | POA: Diagnosis not present

## 2017-05-24 DIAGNOSIS — J9601 Acute respiratory failure with hypoxia: Secondary | ICD-10-CM | POA: Diagnosis not present

## 2017-05-24 LAB — RESPIRATORY PANEL BY PCR

## 2017-05-24 LAB — URINALYSIS, ROUTINE W REFLEX MICROSCOPIC
Bilirubin Urine: NEGATIVE
Glucose, UA: NEGATIVE mg/dL
Hgb urine dipstick: NEGATIVE
Ketones, ur: NEGATIVE mg/dL
Leukocytes, UA: NEGATIVE
Nitrite: NEGATIVE
Protein, ur: NEGATIVE mg/dL
Specific Gravity, Urine: 1.009 (ref 1.005–1.030)
pH: 5 (ref 5.0–8.0)

## 2017-05-24 LAB — CBC WITH DIFFERENTIAL/PLATELET
Basophils Absolute: 0 10*3/uL (ref 0.0–0.1)
Basophils Relative: 0 %
Eosinophils Absolute: 0.1 10*3/uL (ref 0.0–0.7)
Eosinophils Relative: 1 %
HCT: 29.9 % — ABNORMAL LOW (ref 39.0–52.0)
Hemoglobin: 9.3 g/dL — ABNORMAL LOW (ref 13.0–17.0)
Lymphocytes Relative: 11 %
Lymphs Abs: 1.2 10*3/uL (ref 0.7–4.0)
MCH: 31.3 pg (ref 26.0–34.0)
MCHC: 31.1 g/dL (ref 30.0–36.0)
MCV: 100.7 fL — ABNORMAL HIGH (ref 78.0–100.0)
Monocytes Absolute: 1 10*3/uL (ref 0.1–1.0)
Monocytes Relative: 9 %
Neutro Abs: 8.7 10*3/uL — ABNORMAL HIGH (ref 1.7–7.7)
Neutrophils Relative %: 79 %
Platelets: 158 10*3/uL (ref 150–400)
RBC: 2.97 MIL/uL — ABNORMAL LOW (ref 4.22–5.81)
RDW: 15.4 % (ref 11.5–15.5)
WBC: 11 10*3/uL — ABNORMAL HIGH (ref 4.0–10.5)

## 2017-05-24 LAB — STREP PNEUMONIAE URINARY ANTIGEN: Strep Pneumo Urinary Antigen: NEGATIVE

## 2017-05-24 LAB — AEROBIC CULTURE W GRAM STAIN (SUPERFICIAL SPECIMEN)

## 2017-05-24 LAB — AEROBIC CULTURE  (SUPERFICIAL SPECIMEN): Gram Stain: NONE SEEN

## 2017-05-24 LAB — I-STAT CG4 LACTIC ACID, ED: Lactic Acid, Venous: 1.23 mmol/L (ref 0.5–1.9)

## 2017-05-24 MED ORDER — CALCIUM CARBONATE 1250 (500 CA) MG PO TABS
1250.0000 mg | ORAL_TABLET | Freq: Two times a day (BID) | ORAL | Status: DC
  Administered 2017-05-25 – 2017-06-01 (×15): 1250 mg via ORAL
  Filled 2017-05-24 (×15): qty 1

## 2017-05-24 MED ORDER — PANTOPRAZOLE SODIUM 40 MG PO TBEC
40.0000 mg | DELAYED_RELEASE_TABLET | Freq: Two times a day (BID) | ORAL | Status: DC
  Administered 2017-05-24 – 2017-05-26 (×5): 40 mg via ORAL
  Filled 2017-05-24 (×5): qty 1

## 2017-05-24 MED ORDER — HYDROCODONE-ACETAMINOPHEN 5-325 MG PO TABS
1.0000 | ORAL_TABLET | Freq: Four times a day (QID) | ORAL | Status: DC | PRN
  Administered 2017-05-24 – 2017-06-01 (×15): 1 via ORAL
  Filled 2017-05-24 (×16): qty 1

## 2017-05-24 MED ORDER — SENNA 8.6 MG PO TABS
1.0000 | ORAL_TABLET | Freq: Two times a day (BID) | ORAL | Status: DC
  Administered 2017-05-24 – 2017-05-26 (×5): 8.6 mg via ORAL
  Filled 2017-05-24 (×7): qty 1

## 2017-05-24 MED ORDER — POTASSIUM CHLORIDE 10 MEQ/100ML IV SOLN
10.0000 meq | INTRAVENOUS | Status: AC
  Administered 2017-05-24 (×3): 10 meq via INTRAVENOUS
  Filled 2017-05-24 (×3): qty 100

## 2017-05-24 MED ORDER — ACETAMINOPHEN 650 MG RE SUPP: 650.0000 mg | Freq: Four times a day (QID) | RECTAL | Status: DC | PRN

## 2017-05-24 MED ORDER — LIDOCAINE 5 % EX PTCH
1.0000 | MEDICATED_PATCH | CUTANEOUS | Status: DC
  Administered 2017-05-24: 1 via TRANSDERMAL
  Filled 2017-05-24: qty 1

## 2017-05-24 MED ORDER — LEVOTHYROXINE SODIUM 75 MCG PO TABS
150.0000 ug | ORAL_TABLET | Freq: Every day | ORAL | Status: DC
  Administered 2017-05-24 – 2017-06-01 (×9): 150 ug via ORAL
  Filled 2017-05-24 (×9): qty 2

## 2017-05-24 MED ORDER — SODIUM CHLORIDE 0.9 % IV SOLN
INTRAVENOUS | Status: DC
  Administered 2017-05-24 – 2017-05-27 (×3): via INTRAVENOUS

## 2017-05-24 MED ORDER — SODIUM CHLORIDE 0.9 % IV SOLN
2.0000 g | INTRAVENOUS | Status: DC
  Administered 2017-05-24 – 2017-05-25 (×2): 2 g via INTRAVENOUS
  Filled 2017-05-24 (×2): qty 2

## 2017-05-24 MED ORDER — SODIUM CHLORIDE 0.9 % IV SOLN
INTRAVENOUS | Status: DC
  Administered 2017-05-24: 04:00:00 via INTRAVENOUS

## 2017-05-24 MED ORDER — DIVALPROEX SODIUM 250 MG PO DR TAB: 750.0000 mg | DELAYED_RELEASE_TABLET | Freq: Two times a day (BID) | ORAL | Status: DC

## 2017-05-24 MED ORDER — DIVALPROEX SODIUM 250 MG PO DR TAB
250.0000 mg | DELAYED_RELEASE_TABLET | Freq: Every day | ORAL | Status: DC
  Administered 2017-05-24 – 2017-06-01 (×9): 250 mg via ORAL
  Filled 2017-05-24 (×9): qty 1

## 2017-05-24 MED ORDER — EZETIMIBE 10 MG PO TABS
5.0000 mg | ORAL_TABLET | Freq: Every day | ORAL | Status: DC
  Administered 2017-05-24 – 2017-05-31 (×8): 5 mg via ORAL
  Filled 2017-05-24: qty 0.5
  Filled 2017-05-24 (×8): qty 1

## 2017-05-24 MED ORDER — PREDNISONE 10 MG PO TABS
10.0000 mg | ORAL_TABLET | Freq: Every day | ORAL | Status: DC
  Administered 2017-05-24 – 2017-06-01 (×9): 10 mg via ORAL
  Filled 2017-05-24 (×9): qty 1

## 2017-05-24 MED ORDER — QUETIAPINE FUMARATE 25 MG PO TABS: 25.0000 mg | ORAL_TABLET | Freq: Every day | ORAL | Status: DC

## 2017-05-24 MED ORDER — IPRATROPIUM-ALBUTEROL 0.5-2.5 (3) MG/3ML IN SOLN: 3.0000 mL | RESPIRATORY_TRACT | Status: DC | PRN

## 2017-05-24 MED ORDER — PRO-STAT SUGAR FREE PO LIQD
30.0000 mL | Freq: Two times a day (BID) | ORAL | Status: DC
  Administered 2017-05-24 – 2017-06-01 (×16): 30 mL via ORAL
  Filled 2017-05-24 (×17): qty 30

## 2017-05-24 MED ORDER — QUETIAPINE FUMARATE 25 MG PO TABS
12.5000 mg | ORAL_TABLET | Freq: Every day | ORAL | Status: DC
  Filled 2017-05-24: qty 1

## 2017-05-24 MED ORDER — PRAVASTATIN SODIUM 40 MG PO TABS
40.0000 mg | ORAL_TABLET | Freq: Every day | ORAL | Status: DC
  Administered 2017-05-24 – 2017-05-31 (×8): 40 mg via ORAL
  Filled 2017-05-24 (×8): qty 1

## 2017-05-24 MED ORDER — DILTIAZEM HCL ER COATED BEADS 120 MG PO CP24
300.0000 mg | ORAL_CAPSULE | Freq: Every day | ORAL | Status: DC
  Administered 2017-05-24 – 2017-06-01 (×9): 300 mg via ORAL
  Filled 2017-05-24 (×9): qty 1

## 2017-05-24 MED ORDER — QUETIAPINE 12.5 MG HALF TABLET: 12.5000 mg | ORAL_TABLET | Freq: Two times a day (BID) | ORAL | Status: DC

## 2017-05-24 MED ORDER — ZINC SULFATE 220 (50 ZN) MG PO CAPS
220.0000 mg | ORAL_CAPSULE | Freq: Every morning | ORAL | Status: DC
  Administered 2017-05-24 – 2017-06-01 (×9): 220 mg via ORAL
  Filled 2017-05-24 (×9): qty 1

## 2017-05-24 MED ORDER — ONDANSETRON HCL 4 MG PO TABS: 4.0000 mg | ORAL_TABLET | Freq: Four times a day (QID) | ORAL | Status: DC | PRN

## 2017-05-24 MED ORDER — ONDANSETRON HCL 4 MG/2ML IJ SOLN: 4.0000 mg | Freq: Four times a day (QID) | INTRAMUSCULAR | Status: DC | PRN

## 2017-05-24 MED ORDER — APIXABAN 5 MG PO TABS
5.0000 mg | ORAL_TABLET | Freq: Two times a day (BID) | ORAL | Status: DC
  Administered 2017-05-24 – 2017-06-01 (×17): 5 mg via ORAL
  Filled 2017-05-24 (×18): qty 1

## 2017-05-24 MED ORDER — DIVALPROEX SODIUM 250 MG PO DR TAB: 250.0000 mg | DELAYED_RELEASE_TABLET | Freq: Two times a day (BID) | ORAL | Status: DC

## 2017-05-24 MED ORDER — DIVALPROEX SODIUM 500 MG PO DR TAB
750.0000 mg | DELAYED_RELEASE_TABLET | Freq: Every day | ORAL | Status: DC
  Administered 2017-05-24 – 2017-05-31 (×8): 750 mg via ORAL
  Filled 2017-05-24 (×9): qty 1

## 2017-05-24 MED ORDER — GABAPENTIN 300 MG PO CAPS
300.0000 mg | ORAL_CAPSULE | Freq: Two times a day (BID) | ORAL | Status: DC
  Administered 2017-05-24 – 2017-05-26 (×5): 300 mg via ORAL
  Filled 2017-05-24 (×5): qty 1

## 2017-05-24 MED ORDER — DOXAZOSIN MESYLATE 4 MG PO TABS
4.0000 mg | ORAL_TABLET | Freq: Every day | ORAL | Status: DC
  Administered 2017-05-24 – 2017-06-01 (×9): 4 mg via ORAL
  Filled 2017-05-24 (×7): qty 1
  Filled 2017-05-24: qty 2
  Filled 2017-05-24: qty 1

## 2017-05-24 MED ORDER — VANCOMYCIN HCL IN DEXTROSE 1-5 GM/200ML-% IV SOLN
1000.0000 mg | Freq: Once | INTRAVENOUS | Status: AC
  Administered 2017-05-24: 1000 mg via INTRAVENOUS
  Filled 2017-05-24: qty 200

## 2017-05-24 MED ORDER — VANCOMYCIN HCL 10 G IV SOLR
1250.0000 mg | INTRAVENOUS | Status: DC
  Administered 2017-05-24 – 2017-05-28 (×5): 1250 mg via INTRAVENOUS
  Filled 2017-05-24 (×6): qty 1250

## 2017-05-24 MED ORDER — ACETAMINOPHEN 325 MG PO TABS: 650.0000 mg | ORAL_TABLET | Freq: Four times a day (QID) | ORAL | Status: DC | PRN

## 2017-05-24 MED ORDER — SODIUM CHLORIDE 0.9 % IV SOLN
2.0000 g | Freq: Once | INTRAVENOUS | Status: AC
  Administered 2017-05-24: 2 g via INTRAVENOUS
  Filled 2017-05-24: qty 2

## 2017-05-24 NOTE — ED Triage Notes (Signed)
Family at desk requesting pain meds for PT and asked  About AM meds. Family member had questions AM meds.

## 2017-05-24 NOTE — ED Notes (Signed)
Family at bedside. 

## 2017-05-24 NOTE — ED Triage Notes (Signed)
This SN has made 2 phone calls to Pharmacy ans spoke with ANDY to request a Pharmacy tech to come review meds with Pt / wife. Pt and wife report Ptt does not take  The following meds. And do not want to take any until confirimed. 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

## 2017-05-24 NOTE — H&P (Addendum)
History and Physical    [name] MVH:[id number] DOB: 1933/05/28 DOA: 05/23/2017  Referring MD/NP/PA: Soledad Gerlach, PA-C PCP: [name], MD  Patient coming from: Rehab facility via EMS  Chief Complaint: Fever and altered   I have personally briefly reviewed patient's old medical records in [place]   HPI: [name] is a 84 y.o. male with medical history significant of HTN, HLD, diastolic CHF, atrial fibrillation on chronic anticoagulation, seizures, hypothyroidism, dementia, chronic lower extremity wound, and GERD; who presents from rehab after being found to have fever with altered mental status.  History is obtained from the patient and wife and review of records as he is unable to give his own due to being acutely altered.   He had recently been hospitalized from 2/6-2/14 for sepsis secondary to pneumonia and/or chronic leg wounds treated with Zithromax and Rocephin.  At discharge patient was sent to rehab, and had per his wife been improving walking with a rolling walker with planned discharge home sometime next week.  However, around lunchtime patient was noted to be not responding like normally with a temperature of up to 101 F.  He was given Tylenol prior to EMS arrival.  She notes that he has had a mild intermittent cough.  Denies any recent seizure activity to her knowledge.  Reports that while he was at the facility though that they have been giving him more difficult than he normally takes at home.  ED Course: Upon admission into the emergency department patient was seen to have a temperature of 99.42F, O2 saturation 87-102, respiration 14-25, and O2 saturations noted to be 90-100% on room air.  Labs revealed WBC 11, hemoglobin 9.3, potassium 3.3, BUN 28, and creatinine 1.41.  Chest x-ray showing a left basilar opacity suggestive of pneumonia.  Review of Systems  Unable to perform ROS: Mental status change  Constitutional: Positive for fever.  Neurological:  Negative for seizures.    Past Medical History:  Diagnosis Date  . Abnormality of gait 09/22/2015  . Arthritis   . Bruises easily   . Cancer ([place])    skin CA removed from back  . Chronic insomnia 03/30/2015  . Complication of anesthesia    trouble waking up  . Constipation    from medications taken  . GERD (gastroesophageal reflux disease)   . Hypercholesteremia   . Hypertension   . Hypothyroidism   . Memory difficulty 09/22/2015  . Osteoarthritis   . Transient alteration of awareness 03/30/2015  . Vertigo    hx of    Past Surgical History:  Procedure Laterality Date  . BACK SURGERY    . CARDIAC CATHETERIZATION     Stent X 1  . CERVIX SURGERY    . CORONARY ANGIOPLASTY    . EYE SURGERY     Bilateral Cataract surgery   . HERNIA REPAIR    . I&D EXTREMITY Right 05/10/2015   Procedure: IRRIGATION AND DEBRIDEMENT EXTREMITY;  Surgeon: Leanora Cover, MD;  Location: [facility];  Service: Orthopedics;  Laterality: Right;  . KNEE ARTHROPLASTY     right knee X 2; left knee once  . LAMINECTOMY     X 6  . POSTERIOR CERVICAL FUSION/FORAMINOTOMY  01/28/2012   Procedure: POSTERIOR CERVICAL FUSION/FORAMINOTOMY LEVEL 3;  Surgeon: [name], MD;  Location: [facility];  Service: Neurosurgery;  Laterality: Left;  Posterior Cervical Five-Thoracic One Fusion, Arthrodesis with LEFT Cervical Seven-thoracic One Laminectomy, Foraminotomy and Resection of Synovial Cyst  .  POSTERIOR CERVICAL FUSION/FORAMINOTOMY N/A 01/29/2013   Procedure: POSTERIOR CERVICAL FUSION/FORAMINOTOMY LEVEL 1 and C2-5 Posteriolateral Arthrodesis;  Surgeon: [name], MD;  Location: [facility];  Service: Neurosurgery;  Laterality: N/A;  C2-C3 Laminectomy C2-C3 posterior cervical arthrodesis  . TONSILLECTOMY       reports that he has quit smoking. he has never used smokeless tobacco. He reports that he does not drink alcohol or use drugs.  Allergies  Allergen Reactions  . Demerol  [Meperidine] Nausea And Vomiting  . Keppra [Levetiracetam] Other (See Comments)    Causes sleepiness    Family History  Problem Relation Age of Onset  . Hypertension Mother   . Cancer Mother   . Kidney failure Father   . Heart disease Father     Prior to Admission medications   Medication Sig Start Date End Date Taking? Authorizing Provider  acetaminophen (TYLENOL) 325 MG tablet Take 650 mg by mouth every 4 (four) hours as needed for mild pain.   Yes [provider]  alendronate (FOSAMAX) 70 MG tablet Take 70 mg by mouth once a week.  01/06/12  Yes [provider]  Amino Acids-Protein Hydrolys (FEEDING SUPPLEMENT, PRO-STAT SUGAR FREE 64,) LIQD Take 30 mLs by mouth 2 (two) times daily.   Yes [provider]  apixaban (ELIQUIS) 5 MG TABS tablet Take 1 tablet (5 mg total) by mouth 2 (two) times daily. 04/25/17  Yes Lavina Hamman, MD  calcium carbonate (OS-CAL) 600 MG TABS tablet Take 600 mg by mouth 2 (two) times daily with a meal.   Yes [provider]  Cholecalciferol (VITAMIN D-3) 1000 units CAPS Take 1,000 Units by mouth 2 (two) times daily.   Yes [provider]  Coenzyme Q10 (COQ10) 200 MG CAPS Take 200 mg by mouth at bedtime.   Yes [provider]  cyanocobalamin 500 MCG tablet Take 500 mcg by mouth at bedtime.   Yes [provider]  diltiazem (CARDIZEM CD) 300 MG 24 hr capsule Take 1 capsule (300 mg total) by mouth daily. 04/26/17  Yes Lavina Hamman, MD  divalproex (DEPAKOTE) 250 MG DR tablet Take 3 tablets (750 mg total) by mouth every 12 (twelve) hours. Patient taking differently: Take 750 mg by mouth 2 (two) times daily.  07/09/15  Yes Robbie Lis, MD  docusate sodium (COLACE) 100 MG capsule Take 200 mg by mouth 2 (two) times daily.    Yes [provider]  doxazosin (CARDURA) 4 MG tablet Take 1 tablet (4 mg total) by mouth daily. 04/26/17  Yes Lavina Hamman, MD  furosemide (LASIX) 40 MG tablet Take 1  tablet (40 mg total) by mouth 2 (two) times a week. ONLY 05/02/17  Yes Barrett, Evelene Croon, PA-C  gabapentin (NEURONTIN) 300 MG capsule Take 300 mg by mouth 2 (two) times daily.   Yes [provider]  HYDROcodone-acetaminophen (NORCO) 10-325 MG tablet Take 1 tablet by mouth every 6 (six) hours as needed for moderate pain. Patient taking differently: Take 0.5 tablets by mouth every 6 (six) hours as needed for moderate pain.  04/25/17  Yes Lavina Hamman, MD  levothyroxine (SYNTHROID, LEVOTHROID) 150 MCG tablet Take 150 mcg by mouth daily before breakfast. 12/27/11  Yes [provider]  lidocaine (LIDODERM) 5 % Place 1 patch onto the skin daily. Remove & Discard patch within 12 hours or as directed by MD   Yes [provider]  metoprolol succinate (TOPROL-XL) 100 MG 24 hr tablet Take 1 tablet (  100 mg total) by mouth daily. Take with or immediately following a meal. 05/02/17 08/30/17 Yes Barrett, Evelene Croon, PA-C  Multiple Vitamin (MULTIVITAMIN WITH MINERALS) TABS tablet Take 1 tablet by mouth at bedtime.   Yes [provider]  nitroGLYCERIN (NITROSTAT) 0.4 MG SL tablet Place 0.4 mg under the tongue every 5 (five) minutes as needed for chest pain. Reported on 07/20/2015   Yes [provider]  nutrition supplement, JUVEN, (JUVEN) PACK Take 1 packet by mouth 2 (two) times daily between meals.   Yes [provider]  omeprazole (PRILOSEC) 20 MG capsule Take 20 mg by mouth 2 (two) times daily before a meal.  12/23/11  Yes [provider]  polyethylene glycol (MIRALAX / GLYCOLAX) packet Take 17 g by mouth daily as needed for moderate constipation. 04/25/17  Yes Lavina Hamman, MD  pravastatin (PRAVACHOL) 40 MG tablet Take 40 mg by mouth at bedtime.  10/29/14  Yes [provider]  predniSONE (DELTASONE) 10 MG tablet Take 1 tablet (10 mg total) by mouth daily with breakfast. 04/28/17  Yes Lavina Hamman, MD  senna (SENOKOT) 8.6 MG tablet Take 1 tablet  by mouth 2 (two) times daily.    Yes [provider]  vitamin A 8000 UNIT capsule Take 8,000 Units by mouth 2 (two) times daily.   Yes [provider]  vitamin C (ASCORBIC ACID) 500 MG tablet Take 500 mg by mouth every morning.   Yes [provider]  ZETIA 10 MG tablet Take 5 mg by mouth at bedtime.  10/15/11  Yes [provider]  zinc gluconate 50 MG tablet Take 50 mg by mouth every morning.   Yes [provider]  QUEtiapine (SEROQUEL) 25 MG tablet Take 0.5-1 tablets (12.5-25 mg total) by mouth 2 (two) times daily. 12.5 mg in AM and 25 mg in PM 04/25/17   Lavina Hamman, MD    Physical Exam:  Constitutional: Elderly male who appears sick is arousable but not readily following commands at this time. Vitals:   05/23/17 2230 05/23/17 2245 05/24/17 0000 05/24/17 0030  BP: 114/62 107/66 111/71 (!) 107/59  Pulse: 100 94 (!) 102 92  Resp: (!) 24 (!) 23 (!) 25 17  Temp: 99.8 F (37.7 C)     TempSrc: Rectal     SpO2: 100% 100% 100% 100%   Eyes: PERRL, lids and conjunctivae normal ENMT: Mucous membranes are dry. posterior pharynx clear of any exudate or lesions. Neck: normal, supple, no masses, no thyromegaly Respiratory: clear to auscultation bilaterally, no wheezing, no crackles. Normal respiratory effort. No accessory muscle use.  Cardiovascular: Irregularly irregular  no murmurs / rubs / gallops. No extremity edema. 2+ pedal pulses. No carotid bruits.  Abdomen: no tenderness, no masses palpated. No hepatosplenomegaly. Bowel sounds positive.  Musculoskeletal: no clubbing / cyanosis. No joint deformity upper and lower extremities. Good ROM, no contractures. Normal muscle tone.  Skin: no rashes, lesions, ulcers. No induration Neurologic: Able to move all extremities Psychiatric: Unable to assess at this time    Labs on Admission: I have personally reviewed following labs and imaging studies  CBC: Recent Labs  Lab 05/23/17 2143  WBC 11.0*    NEUTROABS 8.7*  HGB 9.3*  HCT 29.9*  MCV 100.7*  PLT 774   Basic Metabolic Panel: Recent Labs  Lab 05/23/17 2143  NA 143  K 3.3*  CL 110  CO2 23  GLUCOSE 100*  BUN 28*  CREATININE 1.41*  CALCIUM 7.9*  GFR: CrCl cannot be calculated (Unknown ideal weight.). Liver Function Tests: Recent Labs  Lab 05/23/17 2143  AST 21  ALT 17  ALKPHOS 50  BILITOT 0.7  PROT 5.0*  ALBUMIN 2.9*   No results for input(s): LIPASE, AMYLASE in the last 168 hours. No results for input(s): AMMONIA in the last 168 hours. Coagulation Profile: Recent Labs  Lab 05/23/17 2143  INR 1.54   Cardiac Enzymes: No results for input(s): CKTOTAL, CKMB, CKMBINDEX, TROPONINI in the last 168 hours. BNP (last 3 results) No results for input(s): PROBNP in the last 8760 hours. HbA1C: No results for input(s): HGBA1C in the last 72 hours. CBG: Recent Labs  Lab 05/23/17 2140  GLUCAP 91   Lipid Profile: No results for input(s): CHOL, HDL, LDLCALC, TRIG, CHOLHDL, LDLDIRECT in the last 72 hours. Thyroid Function Tests: No results for input(s): TSH, T4TOTAL, FREET4, T3FREE, THYROIDAB in the last 72 hours. Anemia Panel: No results for input(s): VITAMINB12, FOLATE, FERRITIN, TIBC, IRON, RETICCTPCT in the last 72 hours. Urine analysis:    Component Value Date/Time   COLORURINE STRAW (A) 05/24/2017 0014   APPEARANCEUR CLEAR 05/24/2017 0014   LABSPEC 1.009 05/24/2017 0014   PHURINE 5.0 05/24/2017 0014   GLUCOSEU NEGATIVE 05/24/2017 0014   HGBUR NEGATIVE 05/24/2017 0014   BILIRUBINUR NEGATIVE 05/24/2017 0014   KETONESUR NEGATIVE 05/24/2017 0014   PROTEINUR NEGATIVE 05/24/2017 0014   UROBILINOGEN 1.0 09/16/2007 1212   NITRITE NEGATIVE 05/24/2017 0014   LEUKOCYTESUR NEGATIVE 05/24/2017 0014   Sepsis Labs: Recent Results (from the past 240 hour(s))  Aerobic Culture (superficial specimen)     Status: None (Preliminary result)   Collection Time: 05/21/17 12:15 PM  Result Value Ref Range Status    Specimen Description   Final    TOE LEFT GREAT Performed at [facility], [place] [address], [place], [place] [address]    Special Requests   Final    NONE Performed at [facility], [place] [address], [place], [place] [address]    Gram Stain   Final    NO WBC SEEN FEW GRAM POSITIVE COCCI Performed at [facility], [place] [address] [place], [place] [address]    Culture MODERATE STAPHYLOCOCCUS AUREUS  Final   Report Status PENDING  Incomplete     Radiological Exams on Admission: Dg Chest 2 View  Result Date: 05/24/2017 CLINICAL DATA:  84 year old male with fever.  Pneumonia. EXAM: CHEST - 2 VIEW COMPARISON:  Chest radiograph dated 04/23/2017 FINDINGS: There is shallow inspiration. Left lung base density may represent atelectasis versus infiltrate. Clinical correlation is recommended. Small left pleural effusion may be present. Interval clearing of the right lung base densities compared to prior radiograph. There is no pneumothorax. Stable cardiomegaly. No acute osseous pathology. IMPRESSION: Left lung base atelectasis versus infiltrate. Interval clearing of the right lung base densities compared to prior radiograph. Electronically Signed   By: Anner Crete M.D.   On: 05/24/2017 00:03   Dg Ankle Complete Right  Result Date: 05/24/2017 CLINICAL DATA:  Chronic right ankle wound.  Fever. EXAM: RIGHT ANKLE - COMPLETE 3+ VIEW COMPARISON:  Right ankle radiographs performed 04/09/2017 FINDINGS: There is no evidence of fracture or dislocation. No osseous erosions are seen. The ankle mortise is intact; the interosseous space is within normal limits. No talar tilt or subluxation is seen. Calcification is noted along the plantar fascia. A plantar calcaneal spur is noted. Scattered vascular calcifications are seen. The joint spaces are preserved. Soft tissue air is  noted along the medial aspect of the lower leg, reflecting a soft tissue ulceration. No radiopaque  foreign bodies are seen. IMPRESSION: 1. No osseous erosions seen. 2. No radiopaque foreign bodies seen. 3. Scattered vascular calcifications noted. Electronically Signed   By: Garald Balding M.D.   On: 05/24/2017 00:07   Dg Foot Complete Left  Result Date: 05/24/2017 CLINICAL DATA:  Acute onset of lethargy and fever. Chronic left great toe wound. EXAM: LEFT FOOT - COMPLETE 3+ VIEW COMPARISON:  Left foot radiographs performed 06/13/2016 FINDINGS: There is no evidence of fracture or dislocation. No osseous erosions are seen. The joint spaces are preserved. There is no evidence of talar subluxation; the subtalar joint is unremarkable in appearance. Plantar and posterior calcaneal spurs are seen. The left great toe wound is not well characterized. No radiopaque foreign bodies are identified. IMPRESSION: No evidence of fracture or dislocation.  No osseous erosions seen. Electronically Signed   By: Garald Balding M.D.   On: 05/24/2017 00:03    EKG: Independently reviewed.  Atrial fibrillation  Assessment/Plan Sepsis secondary to healthcare associated pneumonia: Acute.  Patient was noted to have fever up to 101 F at rehab facility.  Initially to be mildly tachycardic and tachypneic with WBC 11.  Lactic acid was reassuring at 1.39.  Chest x-ray showing signs of a left basilar pneumonia.  Patient was on empiric antibiotics of cefepime and vancomycin.  Other source of possible infection includes patient's chronic ower extremity wounds. - Admit to a telemetry bed - Follow-up blood and sputum cultures - Continue empiric Vancomycin and cefepime, de-escalating medically appropriate - Tylenol prn fever  Acute metabolic encephalopathy: Suspect possibly secondary to underlying infection vs. Medication vs other. - neuro checks - Held sedating pain medications  Acute kidney injury: Patient baseline creatinine previously noted to be around 1 at discharge but presents with a creatinine of 1.41 with BUN elevated at  28 suspect prerenal cause. - IV fluids as tolerated - Recheck BMP - Hold nephrotoxic agent  Anemia: Hemoglobin 9.3 on admission. patient's baseline appears to range from 9-10. - Continue to monitor  Atrial fibrillation - Continue home Cardizem and Eliquis  Diastolic CHF: Last EF noted to be 60-65%.  She does not appear to be fluid overloaded at this time. - Strict intake and output and daily weights  Essential hypertension - Hold furosemide  Seizure disorder: Wife notes patient normally has absence/partial seizures, and never has had generalized tonic-clonic ones.   There has been a mistake and the patient had been given  750 mg twice daily while at the rehab facility. Depakote level noted to be 73.  - Seizure precautions - Continue Depakote   Hypothyroid - Continue levothyroxine  BPH - Continue doxazosin  Dyslipidemia - Continue pravastatin and Zetia  GERD - Continue pharmacy substitution of Protonix  DVT prophylaxis: Eliquis Code Status: Full Family Communication: Discussed plan of care with the patient's wife present at bedside. Disposition Plan: TBD  Consults called: none  Admission status: inpatient   [name] MD [facility] Pager [phone]   If 7PM-7AM, please contact night-coverage [email or web address] Password [email or web address]  05/24/2017, 1:14 AM

## 2017-05-24 NOTE — Progress Notes (Signed)
Pharmacy Antibiotic Note  [name] is a 84 y.o. male admitted on 05/23/2017 with pneumonia.  Pharmacy has been consulted for Vancomycin dosing. CXR with possible PNA. WBC 11. Noted renal dysfunction.   Plan: Vancomycin 1250 mg IV q24h Cefepime per MD Trend WBC, temp, renal function  F/U infectious work-up Drug levels as indicated  Temp (24hrs), Avg:99.3 F (37.4 C), Min:98.7 F (37.1 C), Max:99.8 F (37.7 C)  Wt: ~80 kg   Recent Labs  Lab 05/23/17 2143 05/23/17 2210 05/24/17 0114  WBC 11.0*  --   --   CREATININE 1.41*  --   --   LATICACIDVEN  --  1.39 1.23    CrCl cannot be calculated (Unknown ideal weight.).    Allergies  Allergen Reactions  . Demerol [Meperidine] Nausea And Vomiting  . Keppra [Levetiracetam] Other (See Comments)    Causes sleepiness   Narda Bonds 05/24/2017 4:01 AM

## 2017-05-24 NOTE — ED Triage Notes (Signed)
Wife reported she did not takl with DR . Reported to wife PT needed Diltiazem for fast heart rate. Pt and wife agreed to take meds  As ordered.

## 2017-05-24 NOTE — ED Triage Notes (Signed)
Pt family member reported the dosing on Divalproex was incorrect.Family did not recognize  Most of the meds on Pt current list. Pharmacy called to request a Pharmacy tech to review meds list.

## 2017-05-24 NOTE — Progress Notes (Addendum)
Patient admitted early this morning by my partner.  This is a 84 year old male admitted from skilled nursing facility.  Patient was admitted here in February 2019 discharge to skilled nursing facility for rehab and now readmitted with fever and change in mental status.  He was found to be tachypneic and tachycardic.  His white count was 11,000 lactic acid level was 1.39.  His oxygen saturation was normal upon admission.  His chest x-ray showed possible left lower lobe infiltrates.  Patient is being treated with vancomycin and cefepime.  Blood cultures have been done at the time of admission follow-up.  Patient's wife reported that he was taking narcotics at the nursing home.  She understands that it may need to be held due to his mental status but he also feels like he needed to be on it for pain control.  Will continue and increase IV hydration.  Follow-up labs and renal functions tomorrow.  Hold Lasix at this time as patient is more dehydrated.  Hold Seroquel.

## 2017-05-25 ENCOUNTER — Encounter (HOSPITAL_COMMUNITY): Payer: Self-pay | Admitting: *Deleted

## 2017-05-25 ENCOUNTER — Inpatient Hospital Stay (HOSPITAL_COMMUNITY): Payer: Medicare Other

## 2017-05-25 LAB — BASIC METABOLIC PANEL
Anion gap: 9 (ref 5–15)
BUN: 27 mg/dL — ABNORMAL HIGH (ref 6–20)
CO2: 20 mmol/L — ABNORMAL LOW (ref 22–32)
Calcium: 7.2 mg/dL — ABNORMAL LOW (ref 8.9–10.3)
Chloride: 114 mmol/L — ABNORMAL HIGH (ref 101–111)
Creatinine, Ser: 1.19 mg/dL (ref 0.61–1.24)
GFR calc Af Amer: >60 mL/min (ref >60)
GFR calc non Af Amer: 55 mL/min — ABNORMAL LOW (ref >60)
Glucose, Bld: 93 mg/dL (ref 65–99)
Potassium: 3.4 mmol/L — ABNORMAL LOW (ref 3.5–5.1)
Sodium: 143 mmol/L (ref 135–145)

## 2017-05-25 LAB — CBC WITH DIFFERENTIAL/PLATELET
Basophils Absolute: 0 10*3/uL (ref 0.0–0.1)
Basophils Relative: 0 %
Eosinophils Absolute: 0.1 10*3/uL (ref 0.0–0.7)
Eosinophils Relative: 1 %
HCT: 26.7 % — ABNORMAL LOW (ref 39.0–52.0)
Hemoglobin: 8.1 g/dL — ABNORMAL LOW (ref 13.0–17.0)
Lymphocytes Relative: 9 %
Lymphs Abs: 0.7 10*3/uL (ref 0.7–4.0)
MCH: 30.8 pg (ref 26.0–34.0)
MCHC: 30.3 g/dL (ref 30.0–36.0)
MCV: 101.5 fL — ABNORMAL HIGH (ref 78.0–100.0)
Monocytes Absolute: 1 10*3/uL (ref 0.1–1.0)
Monocytes Relative: 13 %
Neutro Abs: 5.9 10*3/uL (ref 1.7–7.7)
Neutrophils Relative %: 77 %
Platelets: 125 10*3/uL — ABNORMAL LOW (ref 150–400)
RBC: 2.63 MIL/uL — ABNORMAL LOW (ref 4.22–5.81)
RDW: 15.2 % (ref 11.5–15.5)
WBC: 7.7 10*3/uL (ref 4.0–10.5)

## 2017-05-25 LAB — URINE CULTURE: Culture: NO GROWTH

## 2017-05-25 LAB — LEGIONELLA PNEUMOPHILA SEROGP 1 UR AG: L. pneumophila Serogp 1 Ur Ag: NEGATIVE

## 2017-05-25 LAB — VALPROIC ACID LEVEL: Valproic Acid Lvl: 56 ug/mL (ref 50.0–100.0)

## 2017-05-25 MED ORDER — FERROUS SULFATE 325 (65 FE) MG PO TABS
325.0000 mg | ORAL_TABLET | Freq: Two times a day (BID) | ORAL | Status: DC
  Administered 2017-05-25 – 2017-06-01 (×14): 325 mg via ORAL
  Filled 2017-05-25 (×14): qty 1

## 2017-05-25 MED ORDER — POTASSIUM CHLORIDE CRYS ER 20 MEQ PO TBCR
40.0000 meq | EXTENDED_RELEASE_TABLET | Freq: Once | ORAL | Status: AC
  Administered 2017-05-25: 40 meq via ORAL
  Filled 2017-05-25: qty 2

## 2017-05-25 NOTE — Progress Notes (Signed)
PROGRESS NOTE    [name]  SHF:[id number] DOB: Jul 18, 1933 DOA: 05/23/2017 PCP: [name], MD   Brief Narrative by Dr Tamala Julian: " [name] is a 84 y.o. male with medical history significant of HTN, HLD, diastolic CHF, atrial fibrillation on chronic anticoagulation, seizures, hypothyroidism, dementia, chronic lower extremity wound, and GERD; who presents from rehab after being found to have fever with altered mental status.  History is obtained from the patient and wife and review of records as he is unable to give his own due to being acutely altered.   He had recently been hospitalized from 2/6-2/14 for sepsis secondary to pneumonia and/or chronic leg wounds treated with Zithromax and Rocephin.  At discharge patient was sent to rehab, and had per his wife been improving walking with a rolling walker with planned discharge home sometime next week.  However, around lunchtime patient was noted to be not responding like normally with a temperature of up to 101 F.  He was given Tylenol prior to EMS arrival.  She notes that he has had a mild intermittent cough.  Denies any recent seizure activity to her knowledge.  Reports that while he was at the facility though that they have been giving him more than he normally takes at home.  ED Course: Upon admission into the emergency department patient was seen to have a temperature of 99.75F, O2 saturation 87-102, respiration 14-25, and O2 saturations noted to be 90-100% on room air.  Labs revealed WBC 11, hemoglobin 9.3, potassium 3.3, BUN 28, and creatinine 1.41.  Chest x-ray showing a left basilar opacity suggestive of pneumonia".    Assessment & Plan:   Principal Problem:   Sepsis ([place]) Active Problems:   Hypothyroidism   Seizure disorder ([place])   Atrial fibrillation with RVR ([place])   HCAP (healthcare-associated pneumonia)   Acute metabolic encephalopathy   1-Sepsis; wound infection vs PNA Presents with fever, tachycardia, tachypnea.  Chest x ray ; with infiltrates left.  Continue with vancomycin and cefepime.  Blood cultures ordered.   Chronic lower extremities wound;  Worsening drainage. Will get MRI.  Had ABI negative recently .  Depending on MRI result will need ortho evaluation.    Acute Metabolic encephalopathy;  Related to infection, vs medications.  Improved.   AKI;  Cr Baseline 1.  Cr on admission at 1.4 Iv fluids.   A fib ; Continue with Cardizem and eliquis.   Anemia; iron deficiency.  Hb Baseline 9--10.  Start ferrous sulfate.   HTN Continue with Cardizem.   Seizure;  On Depakote.  Dose confirm with wife.   Hypothyroidism;  continue with synthroid.   BPH;  Dyslipidemia;  Continue pravastatin and Zetia  Hypokalemia; replete orally.     DVT prophylaxis: eliquis.  Code Status: full code.  Family Communication: care discussed with patient 's wife Disposition Plan: to be determine.   Consultants:   none   Procedures: none   Antimicrobials:   Vancomycin and cefepime    Subjective: He is feeling better, he is more alert. Mild cough. Repot leg pain.   Objective: Vitals:   05/24/17 1649 05/24/17 1732 05/24/17 2211 05/25/17 0519  BP:  113/63 114/62 112/66  Pulse: (!) 106 (!) 102 94 90  Resp: 13 19 20 18   Temp:  98 F (36.7 C) 98 F (36.7 C) 97.7 F (36.5 C)  TempSrc:  Oral Oral Oral  SpO2: 100%  99% 98%  Weight:  76.7 kg (169 lb 3.2 oz)  80 kg (  176 lb 5.9 oz)  Height:  5\' 7"  (1.702 m)      Intake/Output Summary (Last 24 hours) at 05/25/2017 0724 Last data filed at 05/25/2017 0603 Gross per 24 hour  Intake 3550 ml  Output 350 ml  Net 3200 ml   Filed Weights   05/24/17 1732 05/25/17 0519  Weight: 76.7 kg (169 lb 3.2 oz) 80 kg (176 lb 5.9 oz)    Examination:  General exam: Appears calm and comfortable  Respiratory system: Clear to auscultation. Respiratory effort normal. Cardiovascular system: S1 & S2 heard, RRR. No JVD, murmurs, rubs, gallops or clicks.  No pedal edema. Gastrointestinal system: Abdomen is nondistended, soft and nontender. No organomegaly or masses felt. Normal bowel sounds heard. Central nervous system: Alert and oriented. No focal neurological deficits. Extremities: left big toe with dry wound, mild redness. Right LE with 2 wounds, with purulent drainage.  Skin: No rashes, lesions or ulcers Psychiatry: Judgement and insight appear normal. Mood & affect appropriate.     Data Reviewed: I have personally reviewed following labs and imaging studies  CBC: Recent Labs  Lab 05/23/17 2143  WBC 11.0*  NEUTROABS 8.7*  HGB 9.3*  HCT 29.9*  MCV 100.7*  PLT 585   Basic Metabolic Panel: Recent Labs  Lab 05/23/17 2143 05/25/17 0527  NA 143 143  K 3.3* 3.4*  CL 110 114*  CO2 23 20*  GLUCOSE 100* 93  BUN 28* 27*  CREATININE 1.41* 1.19  CALCIUM 7.9* 7.2*   GFR: Estimated Creatinine Clearance: 49.4 mL/min (by C-G formula based on SCr of 1.19 mg/dL). Liver Function Tests: Recent Labs  Lab 05/23/17 2143  AST 21  ALT 17  ALKPHOS 50  BILITOT 0.7  PROT 5.0*  ALBUMIN 2.9*   No results for input(s): LIPASE, AMYLASE in the last 168 hours. No results for input(s): AMMONIA in the last 168 hours. Coagulation Profile: Recent Labs  Lab 05/23/17 2143  INR 1.54   Cardiac Enzymes: No results for input(s): CKTOTAL, CKMB, CKMBINDEX, TROPONINI in the last 168 hours. BNP (last 3 results) No results for input(s): PROBNP in the last 8760 hours. HbA1C: No results for input(s): HGBA1C in the last 72 hours. CBG: Recent Labs  Lab 05/23/17 2140  GLUCAP 91   Lipid Profile: No results for input(s): CHOL, HDL, LDLCALC, TRIG, CHOLHDL, LDLDIRECT in the last 72 hours. Thyroid Function Tests: No results for input(s): TSH, T4TOTAL, FREET4, T3FREE, THYROIDAB in the last 72 hours. Anemia Panel: No results for input(s): VITAMINB12, FOLATE, FERRITIN, TIBC, IRON, RETICCTPCT in the last 72 hours. Sepsis Labs: Recent Labs  Lab  05/23/17 2210 05/24/17 0114  LATICACIDVEN 1.39 1.23    Recent Results (from the past 240 hour(s))  Aerobic Culture (superficial specimen)     Status: None   Collection Time: 05/21/17 12:15 PM  Result Value Ref Range Status   Specimen Description   Final    TOE LEFT GREAT Performed at [facility] [address], [place], [place] [address]    Special Requests   Final    NONE Performed at [facility], [place] [address], [place], [place] [address]    Gram Stain   Final    NO WBC SEEN FEW GRAM POSITIVE COCCI Performed at [facility], [place] [address] [place], [place] [address]    Culture   Final    MODERATE METHICILLIN RESISTANT STAPHYLOCOCCUS AUREUS   Report Status 05/24/2017 FINAL  Final   Organism ID, Bacteria METHICILLIN RESISTANT STAPHYLOCOCCUS  AUREUS  Final      Susceptibility   Methicillin resistant staphylococcus aureus - MIC*    CIPROFLOXACIN >=8 RESISTANT Resistant     ERYTHROMYCIN >=8 RESISTANT Resistant     GENTAMICIN <=0.5 SENSITIVE Sensitive     OXACILLIN >=4 RESISTANT Resistant     TETRACYCLINE <=1 SENSITIVE Sensitive     VANCOMYCIN 1 SENSITIVE Sensitive     TRIMETH/SULFA <=10 SENSITIVE Sensitive     CLINDAMYCIN >=8 RESISTANT Resistant     RIFAMPIN <=0.5 SENSITIVE Sensitive     Inducible Clindamycin NEGATIVE Sensitive     * MODERATE METHICILLIN RESISTANT STAPHYLOCOCCUS AUREUS  Respiratory Panel by PCR     Status: None   Collection Time: 05/24/17  4:56 AM  Result Value Ref Range Status   Adenovirus NOT DETECTED NOT DETECTED Final   Coronavirus 229E NOT DETECTED NOT DETECTED Final   Coronavirus HKU1 NOT DETECTED NOT DETECTED Final   Coronavirus NL63 NOT DETECTED NOT DETECTED Final   Coronavirus OC43 NOT DETECTED NOT DETECTED Final   Metapneumovirus NOT DETECTED NOT DETECTED Final   Rhinovirus / Enterovirus NOT DETECTED NOT DETECTED Final   Influenza A NOT DETECTED NOT DETECTED Final   Influenza B NOT DETECTED NOT  DETECTED Final   Parainfluenza Virus 1 NOT DETECTED NOT DETECTED Final   Parainfluenza Virus 2 NOT DETECTED NOT DETECTED Final   Parainfluenza Virus 3 NOT DETECTED NOT DETECTED Final   Parainfluenza Virus 4 NOT DETECTED NOT DETECTED Final   Respiratory Syncytial Virus NOT DETECTED NOT DETECTED Final   Bordetella pertussis NOT DETECTED NOT DETECTED Final   Chlamydophila pneumoniae NOT DETECTED NOT DETECTED Final   Mycoplasma pneumoniae NOT DETECTED NOT DETECTED Final    Comment: Performed at [facility], [place]. [address], [place], [place] [address]         Radiology Studies: Dg Chest 2 View  Result Date: 05/24/2017 CLINICAL DATA:  84 year old male with fever.  Pneumonia. EXAM: CHEST - 2 VIEW COMPARISON:  Chest radiograph dated 04/23/2017 FINDINGS: There is shallow inspiration. Left lung base density may represent atelectasis versus infiltrate. Clinical correlation is recommended. Small left pleural effusion may be present. Interval clearing of the right lung base densities compared to prior radiograph. There is no pneumothorax. Stable cardiomegaly. No acute osseous pathology. IMPRESSION: Left lung base atelectasis versus infiltrate. Interval clearing of the right lung base densities compared to prior radiograph. Electronically Signed   By: Anner Crete M.D.   On: 05/24/2017 00:03   Dg Ankle Complete Right  Result Date: 05/24/2017 CLINICAL DATA:  Chronic right ankle wound.  Fever. EXAM: RIGHT ANKLE - COMPLETE 3+ VIEW COMPARISON:  Right ankle radiographs performed 04/09/2017 FINDINGS: There is no evidence of fracture or dislocation. No osseous erosions are seen. The ankle mortise is intact; the interosseous space is within normal limits. No talar tilt or subluxation is seen. Calcification is noted along the plantar fascia. A plantar calcaneal spur is noted. Scattered vascular calcifications are seen. The joint spaces are preserved. Soft tissue air is noted along the medial aspect of the  lower leg, reflecting a soft tissue ulceration. No radiopaque foreign bodies are seen. IMPRESSION: 1. No osseous erosions seen. 2. No radiopaque foreign bodies seen. 3. Scattered vascular calcifications noted. Electronically Signed   By: Garald Balding M.D.   On: 05/24/2017 00:07   Dg Foot Complete Left  Result Date: 05/24/2017 CLINICAL DATA:  Acute onset of lethargy and fever. Chronic left great toe wound. EXAM: LEFT FOOT - COMPLETE 3+ VIEW  COMPARISON:  Left foot radiographs performed 06/13/2016 FINDINGS: There is no evidence of fracture or dislocation. No osseous erosions are seen. The joint spaces are preserved. There is no evidence of talar subluxation; the subtalar joint is unremarkable in appearance. Plantar and posterior calcaneal spurs are seen. The left great toe wound is not well characterized. No radiopaque foreign bodies are identified. IMPRESSION: No evidence of fracture or dislocation.  No osseous erosions seen. Electronically Signed   By: Garald Balding M.D.   On: 05/24/2017 00:03        Scheduled Meds: . apixaban  5 mg Oral BID  . calcium carbonate  1,250 mg Oral BID WC  . diltiazem  300 mg Oral Daily  . divalproex  250 mg Oral Daily   And  . divalproex  750 mg Oral QHS  . doxazosin  4 mg Oral Daily  . ezetimibe  5 mg Oral QHS  . feeding supplement (PRO-STAT SUGAR FREE 64)  30 mL Oral BID  . gabapentin  300 mg Oral BID  . levothyroxine  150 mcg Oral QAC breakfast  . lidocaine  1 patch Transdermal Q24H  . pantoprazole  40 mg Oral BID  . pravastatin  40 mg Oral QHS  . predniSONE  10 mg Oral Q breakfast  . senna  1 tablet Oral BID  . zinc sulfate  220 mg Oral q morning - 10a   Continuous Infusions: . sodium chloride 150 mL/hr at 05/25/17 0119  . ceFEPime (MAXIPIME) IV Stopped (05/24/17 2204)  . vancomycin Stopped (05/25/17 0125)     LOS: 1 day    Time spent: 35 minutes.     Elmarie Shiley, MD [facility] Pager [phone]  If 7PM-7AM, please  contact night-coverage [email or web address] Password [facility] 05/25/2017, 7:24 AM

## 2017-05-26 LAB — CBC
HCT: 26.6 % — ABNORMAL LOW (ref 39.0–52.0)
Hemoglobin: 8.1 g/dL — ABNORMAL LOW (ref 13.0–17.0)
MCH: 31.2 pg (ref 26.0–34.0)
MCHC: 30.5 g/dL (ref 30.0–36.0)
MCV: 102.3 fL — ABNORMAL HIGH (ref 78.0–100.0)
Platelets: 126 10*3/uL — ABNORMAL LOW (ref 150–400)
RBC: 2.6 MIL/uL — ABNORMAL LOW (ref 4.22–5.81)
RDW: 15.2 % (ref 11.5–15.5)
WBC: 8 10*3/uL (ref 4.0–10.5)

## 2017-05-26 LAB — BASIC METABOLIC PANEL
Anion gap: 8 (ref 5–15)
Anion gap: 6 (ref 5–15)
BUN: 29 mg/dL — ABNORMAL HIGH (ref 6–20)
BUN: 28 mg/dL — ABNORMAL HIGH (ref 6–20)
CO2: 17 mmol/L — ABNORMAL LOW (ref 22–32)
CO2: 21 mmol/L — ABNORMAL LOW (ref 22–32)
Calcium: 7.4 mg/dL — ABNORMAL LOW (ref 8.9–10.3)
Calcium: 7.4 mg/dL — ABNORMAL LOW (ref 8.9–10.3)
Chloride: 116 mmol/L — ABNORMAL HIGH (ref 101–111)
Chloride: 115 mmol/L — ABNORMAL HIGH (ref 101–111)
Creatinine, Ser: 1.05 mg/dL (ref 0.61–1.24)
Creatinine, Ser: 1.02 mg/dL (ref 0.61–1.24)
GFR calc Af Amer: >60 mL/min (ref >60)
GFR calc Af Amer: >60 mL/min (ref >60)
GFR calc non Af Amer: >60 mL/min (ref >60)
GFR calc non Af Amer: >60 mL/min (ref >60)
Glucose, Bld: 84 mg/dL (ref 65–99)
Glucose, Bld: 104 mg/dL — ABNORMAL HIGH (ref 65–99)
Potassium: 4.7 mmol/L (ref 3.5–5.1)
Potassium: 4.5 mmol/L (ref 3.5–5.1)
Sodium: 141 mmol/L (ref 135–145)
Sodium: 142 mmol/L (ref 135–145)

## 2017-05-26 LAB — GLUCOSE, CAPILLARY
Glucose-Capillary: 98 mg/dL (ref 65–99)
Glucose-Capillary: 116 mg/dL — ABNORMAL HIGH (ref 65–99)

## 2017-05-26 LAB — BLOOD GAS, ARTERIAL
Acid-base deficit: 2.4 mmol/L — ABNORMAL HIGH (ref 0.0–2.0)
Bicarbonate: 21.5 mmol/L (ref 20.0–28.0)
Drawn by: 10552
O2 Content: 2 L/min
O2 Saturation: 96.3 %
Patient temperature: 98.6
pCO2 arterial: 34 mmHg (ref 32.0–48.0)
pH, Arterial: 7.416 (ref 7.350–7.450)
pO2, Arterial: 82.2 mmHg — ABNORMAL LOW (ref 83.0–108.0)

## 2017-05-26 LAB — HEMOGLOBIN AND HEMATOCRIT, BLOOD
HCT: 26.6 % — ABNORMAL LOW (ref 39.0–52.0)
Hemoglobin: 8.2 g/dL — ABNORMAL LOW (ref 13.0–17.0)

## 2017-05-26 LAB — TROPONIN I
Troponin I: 0.07 ng/mL (ref <0.03)
Troponin I: 0.06 ng/mL (ref <0.03)

## 2017-05-26 LAB — MAGNESIUM: Magnesium: 1.9 mg/dL (ref 1.7–2.4)

## 2017-05-26 MED ORDER — NITROGLYCERIN 0.4 MG SL SUBL: 0.4000 mg | SUBLINGUAL_TABLET | SUBLINGUAL | Status: DC | PRN

## 2017-05-26 MED ORDER — PANTOPRAZOLE SODIUM 40 MG IV SOLR
40.0000 mg | Freq: Two times a day (BID) | INTRAVENOUS | Status: DC
  Administered 2017-05-26 – 2017-05-29 (×8): 40 mg via INTRAVENOUS
  Filled 2017-05-26 (×8): qty 40

## 2017-05-26 MED ORDER — ENSURE ENLIVE PO LIQD
237.0000 mL | Freq: Two times a day (BID) | ORAL | Status: DC
  Administered 2017-05-26 – 2017-05-28 (×4): 237 mL via ORAL

## 2017-05-26 MED ORDER — SODIUM CHLORIDE 0.9 % IV SOLN
2.0000 g | Freq: Two times a day (BID) | INTRAVENOUS | Status: AC
  Administered 2017-05-26 – 2017-05-30 (×10): 2 g via INTRAVENOUS
  Filled 2017-05-26 (×10): qty 2

## 2017-05-26 MED ORDER — METOPROLOL TARTRATE 12.5 MG HALF TABLET
12.5000 mg | ORAL_TABLET | Freq: Two times a day (BID) | ORAL | Status: DC
  Administered 2017-05-26 – 2017-05-27 (×2): 12.5 mg via ORAL
  Filled 2017-05-26 (×2): qty 1

## 2017-05-26 MED ORDER — SODIUM CHLORIDE 0.9 % IV SOLN: 2.0000 g | Freq: Two times a day (BID) | INTRAVENOUS | Status: DC

## 2017-05-26 MED ORDER — MORPHINE SULFATE (PF) 2 MG/ML IV SOLN
1.0000 mg | Freq: Once | INTRAVENOUS | Status: DC
  Filled 2017-05-26: qty 1

## 2017-05-26 MED ORDER — SODIUM BICARBONATE 650 MG PO TABS
1300.0000 mg | ORAL_TABLET | Freq: Two times a day (BID) | ORAL | Status: DC
  Administered 2017-05-26 – 2017-05-28 (×5): 1300 mg via ORAL
  Filled 2017-05-26 (×5): qty 2

## 2017-05-26 MED ORDER — MAGNESIUM SULFATE 2 GM/50ML IV SOLN
2.0000 g | Freq: Once | INTRAVENOUS | Status: AC
  Administered 2017-05-26: 2 g via INTRAVENOUS
  Filled 2017-05-26: qty 50

## 2017-05-26 MED ORDER — ALUM & MAG HYDROXIDE-SIMETH 200-200-20 MG/5ML PO SUSP
15.0000 mL | Freq: Once | ORAL | Status: AC
  Administered 2017-05-26: 15 mL via ORAL
  Filled 2017-05-26: qty 30

## 2017-05-26 MED ORDER — GABAPENTIN 100 MG PO CAPS: 200.0000 mg | ORAL_CAPSULE | Freq: Two times a day (BID) | ORAL | Status: DC

## 2017-05-26 NOTE — Progress Notes (Signed)
PROGRESS NOTE    [name]  IZT:[id number] DOB: 11/25/1933 DOA: 05/23/2017 PCP: [name], MD   Brief Narrative by Dr Tamala Julian: " [name] is a 84 y.o. male with medical history significant of HTN, HLD, diastolic CHF, atrial fibrillation on chronic anticoagulation, seizures, hypothyroidism, dementia, chronic lower extremity wound, and GERD; who presents from rehab after being found to have fever with altered mental status.  History is obtained from the patient and wife and review of records as he is unable to give his own due to being acutely altered.   He had recently been hospitalized from 2/6-2/14 for sepsis secondary to pneumonia and/or chronic leg wounds treated with Zithromax and Rocephin.  At discharge patient was sent to rehab, and had per his wife been improving walking with a rolling walker with planned discharge home sometime next week.  However, around lunchtime patient was noted to be not responding like normally with a temperature of up to 101 F.  He was given Tylenol prior to EMS arrival.  She notes that he has had a mild intermittent cough.  Denies any recent seizure activity to her knowledge.  Reports that while he was at the facility though that they have been giving him more than he normally takes at home.  ED Course: Upon admission into the emergency department patient was seen to have a temperature of 99.30F, O2 saturation 87-102, respiration 14-25, and O2 saturations noted to be 90-100% on room air.  Labs revealed WBC 11, hemoglobin 9.3, potassium 3.3, BUN 28, and creatinine 1.41.  Chest x-ray showing a left basilar opacity suggestive of pneumonia".    Assessment & Plan:   Principal Problem:   Sepsis ([place]) Active Problems:   Hypothyroidism   Seizure disorder ([place])   Atrial fibrillation with RVR ([place])   HCAP (healthcare-associated pneumonia)   Acute metabolic encephalopathy   1-Sepsis; wound infection vs PNA Presents with fever, tachycardia, tachypnea.  Chest x ray ; with infiltrates left.  Continue with vancomycin and cefepime.  Blood cultures no growth to date.   Chronic lower extremities wound;  Worsening drainage. Will get MRI.  Had ABI negative recently .  Depending on MRI result will need ortho evaluation.    Acute Metabolic encephalopathy;  Related to infection, vs medications.  Per wife he has been more sleepy today. I got ABG, with normal PH and CO2.  I came back and saw him. He wake up answer questions.   Metabolic acidosis;  Started sodium bicarb tablet.  ABG with normal Ph./   Chest pain. He reported chest chest pain. Not reliable historian, with his confusion.  EKG with no significant changes, will cycle cardiac enzymes.  Check hb.  Nitroglycerin PRN,. maalox PRN.   AKI;  Cr Baseline 1.  Cr on admission at 1.4 Iv fluids.   A fib ; Continue with Cardizem and eliquis.   Anemia; iron deficiency.  Hb Baseline 9--10.  Started  ferrous sulfate.   HTN Continue with Cardizem.   Seizure;  On Depakote.  Dose confirm with wife.   Hypothyroidism;  continue with synthroid.   BPH;  Dyslipidemia;  Continue pravastatin and Zetia  Hypokalemia; replete orally.     DVT prophylaxis: eliquis.  Code Status: full code.  Family Communication: care discussed with patient 's wife Disposition Plan: to be determine.   Consultants:   none   Procedures: none   Antimicrobials:   Vancomycin and cefepime    Subjective: I saw patient this am and he was  doing well, no new complaint.  Them , I was called by nurse this afternoon, who relates patient has been more sleepy today than yesterday. I came back and evaluated patient, he was alert, answer questions. I asked him if he was having chest pain/ and he relates yes, sharp, 6/10 he think he has had pain since a long time. He is not reliable historian due to confusion. He subsequently relates that he is not having chest pain.   Objective: Vitals:   05/25/17 2048  05/26/17 0500 05/26/17 0537 05/26/17 0912  BP: (!) 141/98  140/69 (!) 124/58  Pulse: 91  99 95  Resp: 16  16   Temp: (!) 97.4 F (36.3 C)  (!) 97.5 F (36.4 C)   TempSrc: Oral  Oral   SpO2: 96%  100%   Weight:  83.2 kg (183 lb 6.8 oz)    Height:        Intake/Output Summary (Last 24 hours) at 05/26/2017 1333 Last data filed at 05/26/2017 0300 Gross per 24 hour  Intake 787.5 ml  Output 1000 ml  Net -212.5 ml   Filed Weights   05/24/17 1732 05/25/17 0519 05/26/17 0500  Weight: 76.7 kg (169 lb 3.2 oz) 80 kg (176 lb 5.9 oz) 83.2 kg (183 lb 6.8 oz)    Examination:  General exam: NAD Respiratory system: Normal respiratory effort. CTA Cardiovascular system: S 1, S 2 RRR Gastrointestinal system: BS present, soft, nt Central nervous system: alert, non focal.  Extremities: left big toe with dry wound, less  redness. Right LE with 2 wounds, with purulent drainage.  Skin: No rash     Data Reviewed: I have personally reviewed following labs and imaging studies  CBC: Recent Labs  Lab 05/23/17 2143 05/25/17 0527 05/26/17 0809  WBC 11.0* 7.7 8.0  NEUTROABS 8.7* 5.9  --   HGB 9.3* 8.1* 8.1*  HCT 29.9* 26.7* 26.6*  MCV 100.7* 101.5* 102.3*  PLT 158 125* 235*   Basic Metabolic Panel: Recent Labs  Lab 05/23/17 2143 05/25/17 0527 05/26/17 0809 05/26/17 0948  NA 143 143 141  --   K 3.3* 3.4* 4.7  --   CL 110 114* 116*  --   CO2 23 20* 17*  --   GLUCOSE 100* 93 84  --   BUN 28* 27* 29*  --   CREATININE 1.41* 1.19 1.05  --   CALCIUM 7.9* 7.2* 7.4*  --   MG  --   --   --  1.9   GFR: Estimated Creatinine Clearance: 56.9 mL/min (by C-G formula based on SCr of 1.05 mg/dL). Liver Function Tests: Recent Labs  Lab 05/23/17 2143  AST 21  ALT 17  ALKPHOS 50  BILITOT 0.7  PROT 5.0*  ALBUMIN 2.9*   No results for input(s): LIPASE, AMYLASE in the last 168 hours. No results for input(s): AMMONIA in the last 168 hours. Coagulation Profile: Recent Labs  Lab 05/23/17 2143   INR 1.54   Cardiac Enzymes: No results for input(s): CKTOTAL, CKMB, CKMBINDEX, TROPONINI in the last 168 hours. BNP (last 3 results) No results for input(s): PROBNP in the last 8760 hours. HbA1C: No results for input(s): HGBA1C in the last 72 hours. CBG: Recent Labs  Lab 05/23/17 2140 05/26/17 1308  GLUCAP 91 98   Lipid Profile: No results for input(s): CHOL, HDL, LDLCALC, TRIG, CHOLHDL, LDLDIRECT in the last 72 hours. Thyroid Function Tests: No results for input(s): TSH, T4TOTAL, FREET4, T3FREE, THYROIDAB in the last 72 hours.  Anemia Panel: No results for input(s): VITAMINB12, FOLATE, FERRITIN, TIBC, IRON, RETICCTPCT in the last 72 hours. Sepsis Labs: Recent Labs  Lab 05/23/17 2210 05/24/17 0114  LATICACIDVEN 1.39 1.23    Recent Results (from the past 240 hour(s))  Aerobic Culture (superficial specimen)     Status: None   Collection Time: 05/21/17 12:15 PM  Result Value Ref Range Status   Specimen Description   Final    TOE LEFT GREAT Performed at [facility] [address] [place], [place] [address]    Special Requests   Final    NONE Performed at [name], [place] [address], [place], [place] [address]    Gram Stain   Final    NO WBC SEEN FEW GRAM POSITIVE COCCI Performed at [facility], [place] [address] [place], [place] [address]    Culture   Final    MODERATE METHICILLIN RESISTANT STAPHYLOCOCCUS AUREUS   Report Status 05/24/2017 FINAL  Final   Organism ID, Bacteria METHICILLIN RESISTANT STAPHYLOCOCCUS AUREUS  Final      Susceptibility   Methicillin resistant staphylococcus aureus - MIC*    CIPROFLOXACIN >=8 RESISTANT Resistant     ERYTHROMYCIN >=8 RESISTANT Resistant     GENTAMICIN <=0.5 SENSITIVE Sensitive     OXACILLIN >=4 RESISTANT Resistant     TETRACYCLINE <=1 SENSITIVE Sensitive     VANCOMYCIN 1 SENSITIVE Sensitive     TRIMETH/SULFA <=10 SENSITIVE Sensitive     CLINDAMYCIN >=8 RESISTANT Resistant       RIFAMPIN <=0.5 SENSITIVE Sensitive     Inducible Clindamycin NEGATIVE Sensitive     * MODERATE METHICILLIN RESISTANT STAPHYLOCOCCUS AUREUS  Culture, blood (Routine x 2)     Status: None (Preliminary result)   Collection Time: 05/23/17  9:45 PM  Result Value Ref Range Status   Specimen Description BLOOD RIGHT FOREARM  Final   Special Requests   Final    BOTTLES DRAWN AEROBIC AND ANAEROBIC Blood Culture adequate volume   Culture   Final    NO GROWTH 1 DAY Performed at [facility], [address], [place], [place] [address]    Report Status PENDING  Incomplete  Culture, blood (Routine x 2)     Status: None (Preliminary result)   Collection Time: 05/23/17  9:49 PM  Result Value Ref Range Status   Specimen Description BLOOD LEFT ARM  Final   Special Requests   Final    BOTTLES DRAWN AEROBIC AND ANAEROBIC Blood Culture adequate volume   Culture   Final    NO GROWTH 1 DAY Performed at [facility], [place] [address] [place], [place] [address]    Report Status PENDING  Incomplete  Urine Culture     Status: None   Collection Time: 05/24/17 12:14 AM  Result Value Ref Range Status   Specimen Description URINE, CATHETERIZED  Final   Special Requests NONE  Final   Culture   Final    NO GROWTH Performed at [facility], [place] [address], [place],  [address]    Report Status 05/25/2017 FINAL  Final  Respiratory Panel by PCR     Status: None   Collection Time: 05/24/17  4:56 AM  Result Value Ref Range Status   Adenovirus NOT DETECTED NOT DETECTED Final   Coronavirus 229E NOT DETECTED NOT DETECTED Final   Coronavirus HKU1 NOT DETECTED NOT DETECTED Final   Coronavirus NL63 NOT DETECTED NOT DETECTED Final   Coronavirus OC43  NOT DETECTED NOT DETECTED Final   Metapneumovirus NOT DETECTED NOT DETECTED Final   Rhinovirus / Enterovirus NOT DETECTED NOT DETECTED Final   Influenza A NOT DETECTED NOT DETECTED Final   Influenza B NOT DETECTED NOT DETECTED Final    Parainfluenza Virus 1 NOT DETECTED NOT DETECTED Final   Parainfluenza Virus 2 NOT DETECTED NOT DETECTED Final   Parainfluenza Virus 3 NOT DETECTED NOT DETECTED Final   Parainfluenza Virus 4 NOT DETECTED NOT DETECTED Final   Respiratory Syncytial Virus NOT DETECTED NOT DETECTED Final   Bordetella pertussis NOT DETECTED NOT DETECTED Final   Chlamydophila pneumoniae NOT DETECTED NOT DETECTED Final   Mycoplasma pneumoniae NOT DETECTED NOT DETECTED Final    Comment: Performed at [facility], [place] [address] [place], [place] [address]         Radiology Studies: No results found.      Scheduled Meds: . apixaban  5 mg Oral BID  . calcium carbonate  1,250 mg Oral BID WC  . diltiazem  300 mg Oral Daily  . divalproex  250 mg Oral Daily   And  . divalproex  750 mg Oral QHS  . doxazosin  4 mg Oral Daily  . ezetimibe  5 mg Oral QHS  . feeding supplement (ENSURE ENLIVE)  237 mL Oral BID BM  . feeding supplement (PRO-STAT SUGAR FREE 64)  30 mL Oral BID  . ferrous sulfate  325 mg Oral BID WC  . levothyroxine  150 mcg Oral QAC breakfast  .  morphine injection  1 mg Intravenous Once  . pantoprazole  40 mg Oral BID  . pravastatin  40 mg Oral QHS  . predniSONE  10 mg Oral Q breakfast  . senna  1 tablet Oral BID  . sodium bicarbonate  1,300 mg Oral BID  . zinc sulfate  220 mg Oral q morning - 10a   Continuous Infusions: . sodium chloride 50 mL/hr at 05/25/17 0857  . ceFEPime (MAXIPIME) IV Stopped (05/26/17 1349)  . magnesium sulfate 1 - 4 g bolus IVPB    . vancomycin Stopped (05/25/17 2216)     LOS: 2 days    Time spent: 35 minutes.     Elmarie Shiley, MD [facility] Pager [phone]  If 7PM-7AM, please contact night-coverage [email or web address] Password [email or web address] 05/26/2017, 1:33 PM

## 2017-05-26 NOTE — Progress Notes (Signed)
Patient had a 12-beat run of V-tach. No c/o chest pain or discomfort. Patient is resting comfortably in bed. Will continue to monitor and notify MD of changes.

## 2017-05-26 NOTE — Progress Notes (Signed)
MD notified of patient having a CO2 of 17 which dropped since yesterday. Patient is more drowsy today compared to yesterday. STAT ABG was put in and CBG is getting checked.

## 2017-05-26 NOTE — Progress Notes (Signed)
RN paged C. Bodenheimer, NP with last Troponin of 0.06 which is down from 0.07.  P.J. Linus Mako, RN

## 2017-05-26 NOTE — Progress Notes (Signed)
RN paged C. Bodenheimer, NP to make him aware that per CCMD, patient had 10 beat run of v tach. Patient states he is feeling ok at this time, denies any problems.  BP at this time 120/67 and HR 82.  RN will continue to monitor patient and report any issues to provider immediately.  P.J. [place] Mako, RN

## 2017-05-26 NOTE — Evaluation (Signed)
Clinical/Bedside Swallow Evaluation Patient Details  Name: [name] MRN: [id number] Date of Birth: 1933-06-23  Today's Date: 05/26/2017 Time: SLP Start Time (ACUTE ONLY): 25 SLP Stop Time (ACUTE ONLY): 1100 SLP Time Calculation (min) (ACUTE ONLY): 20 min  Past Medical History:  Past Medical History:  Diagnosis Date  . Abnormality of gait 09/22/2015  . Arthritis   . Bruises easily   . Cancer ([place])    skin CA removed from back  . Chronic insomnia 03/30/2015  . Complication of anesthesia    trouble waking up  . Constipation    from medications taken  . GERD (gastroesophageal reflux disease)   . Hypercholesteremia   . Hypertension   . Hypothyroidism   . Memory difficulty 09/22/2015  . Osteoarthritis   . Transient alteration of awareness 03/30/2015  . Vertigo    hx of   Past Surgical History:  Past Surgical History:  Procedure Laterality Date  . BACK SURGERY    . CARDIAC CATHETERIZATION     Stent X 1  . CERVIX SURGERY    . CORONARY ANGIOPLASTY    . EYE SURGERY     Bilateral Cataract surgery   . HERNIA REPAIR    . I&D EXTREMITY Right 05/10/2015   Procedure: IRRIGATION AND DEBRIDEMENT EXTREMITY;  Surgeon: Leanora Cover, MD;  Location: [facility];  Service: Orthopedics;  Laterality: Right;  . KNEE ARTHROPLASTY     right knee X 2; left knee once  . LAMINECTOMY     X 6  . POSTERIOR CERVICAL FUSION/FORAMINOTOMY  01/28/2012   Procedure: POSTERIOR CERVICAL FUSION/FORAMINOTOMY LEVEL 3;  Surgeon: [name], MD;  Location: [facility];  Service: Neurosurgery;  Laterality: Left;  Posterior Cervical Five-Thoracic One Fusion, Arthrodesis with LEFT Cervical Seven-thoracic One Laminectomy, Foraminotomy and Resection of Synovial Cyst  . POSTERIOR CERVICAL FUSION/FORAMINOTOMY N/A 01/29/2013   Procedure: POSTERIOR CERVICAL FUSION/FORAMINOTOMY LEVEL 1 and C2-5 Posteriolateral Arthrodesis;  Surgeon: [name], MD;  Location: [facility];  Service:  Neurosurgery;  Laterality: N/A;  C2-C3 Laminectomy C2-C3 posterior cervical arthrodesis  . TONSILLECTOMY     HPI:   [place] is a 84 y.o. male with medical history significant of HTN, HLD, diastolic CHF, atrial fibrillation on chronic anticoagulation, seizures, hypothyroidism, dementia, chronic lower extremity wound, and GERD; who presents from rehab after being found to have fever with altered mental status.  Pt found to be septic secondary to pna vs wound infection. CXR shows Left lung base atelectasis versus infiltrate.  Has had mild difficulty chewing in the past when mental status altered, but no signs of aspiration observed in SLP assessments.    Assessment / Plan / Recommendation Clinical Impression  Pt demonstrates normal swallow function with no eivdence of dysphagia. Pt does have other risk factors for aspiration pna including consuming PO in bed with assist and dependence on caregivers for oral care. Provided wife and pt with rationale for oral hygiene to reduce oral bacteria and thus risk of pulmonary infection. We also discussed importance of upright posture and staff education. No diet modification or SLP f/u needed. Will sign off.  SLP Visit Diagnosis: Dysphagia, unspecified (R13.10)    Aspiration Risk  Mild aspiration risk    Diet Recommendation Regular;Thin liquid   Liquid Administration via: Cup;Straw Medication Administration: Whole meds with liquid Supervision: Patient able to self feed Compensations: Slow rate;Small sips/bites Postural Changes: Seated upright at 90 degrees    Other  Recommendations Oral Care Recommendations: Oral care BID  Follow up Recommendations Skilled Nursing facility      Frequency and Duration            Prognosis        Swallow Study   General HPI:  [name] is a 84 y.o. male with medical history significant of HTN, HLD, diastolic CHF, atrial fibrillation on chronic anticoagulation, seizures, hypothyroidism, dementia,  chronic lower extremity wound, and GERD; who presents from rehab after being found to have fever with altered mental status.  Pt found to be septic secondary to pna vs wound infection. CXR shows Left lung base atelectasis versus infiltrate.  Has had mild difficulty chewing in the past when mental status altered, but no signs of aspiration observed in SLP assessments.  Type of Study: Bedside Swallow Evaluation Previous Swallow Assessment: 2017 Diet Prior to this Study: Regular;Thin liquids Temperature Spikes Noted: No Respiratory Status: Nasal cannula History of Recent Intubation: No Behavior/Cognition: Alert;Cooperative;Pleasant mood Oral Cavity Assessment: Within Functional Limits Oral Care Completed by SLP: Other (Comment)(wife plans to help pt brush teeth after pericare) Oral Cavity - Dentition: Adequate natural dentition Vision: Functional for self-feeding Self-Feeding Abilities: Able to feed self;Needs assist Patient Positioning: Upright in bed Baseline Vocal Quality: Normal Volitional Cough: Strong Volitional Swallow: Able to elicit    Oral/Motor/Sensory Function Overall Oral Motor/Sensory Function: Within functional limits   Ice Chips Ice chips: Within functional limits   Thin Liquid Thin Liquid: Within functional limits    Nectar Thick Nectar Thick Liquid: Not tested   Honey Thick Honey Thick Liquid: Not tested   Puree Puree: Within functional limits Presentation: Spoon   Solid   GO   Solid: Within functional limits       [facility], MA CCC-SLP [phone]  Lynann Beaver 05/26/2017,11:07 AM

## 2017-05-26 NOTE — Progress Notes (Signed)
Patient is refusing MRI at this time. Patient and wife were educated but still are refusing.

## 2017-05-26 NOTE — Progress Notes (Signed)
CRITICAL VALUE ALERT  Critical Value:  Troponin 0.07  Date & Time Notied:  05/26/2017 and 1458  Provider Notified: Regaldo  Orders Received/Actions taken: MD is aware. Patient is not having chest pain at this time. We plan to continue to monitor the patient. Since it is only slightly elevated we will only give metoprolol at this time and just keep monitoring.

## 2017-05-27 ENCOUNTER — Encounter (HOSPITAL_COMMUNITY): Payer: Self-pay | Admitting: Cardiology

## 2017-05-27 ENCOUNTER — Inpatient Hospital Stay (HOSPITAL_COMMUNITY): Payer: Medicare Other

## 2017-05-27 DIAGNOSIS — R748 Abnormal levels of other serum enzymes: Secondary | ICD-10-CM

## 2017-05-27 LAB — CBC
HCT: 28.3 % — ABNORMAL LOW (ref 39.0–52.0)
Hemoglobin: 8.8 g/dL — ABNORMAL LOW (ref 13.0–17.0)
MCH: 31.7 pg (ref 26.0–34.0)
MCHC: 31.1 g/dL (ref 30.0–36.0)
MCV: 101.8 fL — ABNORMAL HIGH (ref 78.0–100.0)
Platelets: 136 10*3/uL — ABNORMAL LOW (ref 150–400)
RBC: 2.78 MIL/uL — ABNORMAL LOW (ref 4.22–5.81)
RDW: 15.1 % (ref 11.5–15.5)
WBC: 8.3 10*3/uL (ref 4.0–10.5)

## 2017-05-27 LAB — BASIC METABOLIC PANEL
Anion gap: 8 (ref 5–15)
BUN: 23 mg/dL — ABNORMAL HIGH (ref 6–20)
CO2: 22 mmol/L (ref 22–32)
Calcium: 7.6 mg/dL — ABNORMAL LOW (ref 8.9–10.3)
Chloride: 112 mmol/L — ABNORMAL HIGH (ref 101–111)
Creatinine, Ser: 1 mg/dL (ref 0.61–1.24)
GFR calc Af Amer: >60 mL/min (ref >60)
GFR calc non Af Amer: >60 mL/min (ref >60)
Glucose, Bld: 90 mg/dL (ref 65–99)
Potassium: 3.6 mmol/L (ref 3.5–5.1)
Sodium: 142 mmol/L (ref 135–145)

## 2017-05-27 LAB — TROPONIN I: Troponin I: 0.08 ng/mL (ref <0.03)

## 2017-05-27 MED ORDER — METOPROLOL TARTRATE 25 MG PO TABS
25.0000 mg | ORAL_TABLET | Freq: Two times a day (BID) | ORAL | Status: DC
  Administered 2017-05-27 – 2017-06-01 (×10): 25 mg via ORAL
  Filled 2017-05-27 (×10): qty 1

## 2017-05-27 MED ORDER — IPRATROPIUM-ALBUTEROL 0.5-2.5 (3) MG/3ML IN SOLN
3.0000 mL | Freq: Three times a day (TID) | RESPIRATORY_TRACT | Status: DC
  Administered 2017-05-28 – 2017-05-30 (×5): 3 mL via RESPIRATORY_TRACT
  Filled 2017-05-27 (×7): qty 3

## 2017-05-27 MED ORDER — COLLAGENASE 250 UNIT/GM EX OINT
TOPICAL_OINTMENT | Freq: Every day | CUTANEOUS | Status: DC
  Administered 2017-05-27: 17:00:00 via TOPICAL
  Administered 2017-05-28: 1 via TOPICAL
  Administered 2017-05-29 – 2017-06-01 (×4): via TOPICAL
  Filled 2017-05-27: qty 30

## 2017-05-27 MED ORDER — IPRATROPIUM-ALBUTEROL 0.5-2.5 (3) MG/3ML IN SOLN
3.0000 mL | Freq: Four times a day (QID) | RESPIRATORY_TRACT | Status: DC
  Administered 2017-05-27 (×2): 3 mL via RESPIRATORY_TRACT
  Filled 2017-05-27: qty 3

## 2017-05-27 MED ORDER — FUROSEMIDE 40 MG PO TABS
40.0000 mg | ORAL_TABLET | ORAL | Status: DC
  Administered 2017-05-28: 40 mg via ORAL
  Filled 2017-05-27: qty 1

## 2017-05-27 MED ORDER — FUROSEMIDE 10 MG/ML IJ SOLN
20.0000 mg | Freq: Once | INTRAMUSCULAR | Status: AC
  Administered 2017-05-27: 20 mg via INTRAVENOUS
  Filled 2017-05-27: qty 2

## 2017-05-27 MED ORDER — METHYLPREDNISOLONE SODIUM SUCC 40 MG IJ SOLR
40.0000 mg | Freq: Once | INTRAMUSCULAR | Status: AC
  Administered 2017-05-27: 40 mg via INTRAVENOUS
  Filled 2017-05-27: qty 1

## 2017-05-27 NOTE — Consult Note (Addendum)
   [facility] CM Inpatient Consult   05/27/2017  [name] 05/27/1933 [id number]    Patient screened for potential [facility] Care Management services due to increased unplanned readmission score of 35%.  Went to bedside to speak with patient. However, he was receiving nursing care. Wife not present at time of writer's visit.  Chart reviewed. Noted patient is from [facility]. Will engage for Cross Management program if appropriate    [name], [place], RN,BSN [facility] Liaison [phone]

## 2017-05-27 NOTE — Progress Notes (Signed)
Initial Nutrition Assessment  DOCUMENTATION CODES:   Not applicable  INTERVENTION:    Continue Ensure Enlive po BID, each supplement provides 350 kcal and 20 grams of protein   Continue Prostat liquid protein po 30 ml BID with meals, each supplement provides 100 kcal, 15 grams protein  NUTRITION DIAGNOSIS:   Increased nutrient needs related to chronic illness, wound healing as evidenced by estimated needs  GOAL:   Patient will meet greater than or equal to 90% of their needs  MONITOR:   PO intake, Supplement acceptance, Labs, Skin, Weight trends, I & O's  REASON FOR ASSESSMENT:   Consult Assessment of nutrition requirement/status  ASSESSMENT:   [name] with PMH significant of HTN, HLD, diastolic CHF, atrial fibrillation on chronic anticoagulation, seizures, hypothyroidism, dementia, chronic lower extremity wound, and GERD; who presents from rehab after being found to have fever with altered mental status.  Pt found to be septic secondary to pna vs wound infection. CXR shows Left lung base atelectasis versus infiltrate.  Pt receiving wound care upon RD visit. No % PO intake records available per flowsheets. S/p bedside swallow evaluation 3/17. Pt with normal swallow.  Ensure Enlive and Prostat liquid protein supplements ordered. Medication reviewed and include zinc sulfate. Labs reviewed. CBG's 98-116.  NUTRITION - FOCUSED PHYSICAL EXAM:  Unable to complete at this time.  Diet Order:  Seizure precautions Diet regular Room service appropriate? Yes; Fluid consistency: Thin  EDUCATION NEEDS:   Not appropriate for education at this time  Skin:  Skin Assessment: Skin Integrity Issues: Skin Integrity Issues:: Other (Comment) Other: non-healing vasular wounds: L 4th toe, R 4th toe, R medial lower leg, R medial leg  Last BM:  3/17   Intake/Output Summary (Last 24 hours) at 05/27/2017 1149 Last data filed at 05/27/2017 1113 Gross per 24 hour  Intake 1759.17 ml   Output 2150 ml  Net -390.83 ml   Height:   Ht Readings from Last 1 Encounters:  05/24/17 5\' 7"  (1.702 m)   Weight:   Wt Readings from Last 1 Encounters:  05/26/17 183 lb 6.8 oz (83.2 kg)   Ideal Body Weight:  67.2 kg  BMI:  Body mass index is 28.73 kg/m.  Estimated Nutritional Needs:   Kcal:  1900-2100  Protein:  90-105 gm  Fluid:  1.9-2.1 L  Arthur Holms, RD, LDN Pager #: [phone] After-Hours Pager #: [phone]

## 2017-05-27 NOTE — Consult Note (Signed)
[facility] Nurse wound consult note Reason for Consult:Nonhealing wounds to left great toe and 4th metatarsal.  (MRSA+)  Erythema noted.  Dry and no drainage noted today.   Right medial lower leg with 2- nonintact lesions. BOth are 100% adherent slough today.  Wound type:Nonhealing vascular wounds.  Pressure Injury POA: NA Measurement:left great toe:  0.5 cm scabbed lesion LEft 4th toe:  0.3 cm scabbed lesion Right 4th toe:  0.2 cm scabbed lesion Right medial lower leg:  Distal:  2 cm x 0.5 cm 100% slough to wound bed Right medial leg:  Proximal:  3 cm x 2 cm x 0.3 cm depth with 100% slough to wound bed.  Will begin enzymatic debridement to right medial leg with silver hydrofiber to toe wounds.  Wound YQM:VHQIONGEXBM tissue Drainage (amount, consistency, odor) Right medial leg with minimal serosanguinous  No odor Toe wounds are scabbed and dry Periwound:erythema to left toe wounds Dressing procedure/placement/frequency:Cleanse wounds to tip of toes (bilateral feet) with NS.  Apply Aquacel Ag to scabbed lesions. Secure with gauze and tape.  Change Monday/Wednesday/Friday.  Cleanse wounds to right medial lower leg with NS.  Apply Santyl to wound bed.  Cover with NS moist 2x2 and secure with 4x4 gauze and kerlix.  Change daily.  BEdside RN to perform.  Will not follow at this time.  Please re-consult if needed.  Domenic Moras RN BSN [facility] Pager [phone]

## 2017-05-27 NOTE — Progress Notes (Addendum)
PROGRESS NOTE    [name]  JXB:[id number] DOB: [birth year] DOA: 05/23/2017 PCP: [name], MD   Brief Narrative by Dr Tamala Julian: " [name] is a 84 y.o. male with medical history significant of HTN, HLD, diastolic CHF, atrial fibrillation on chronic anticoagulation, seizures, hypothyroidism, dementia, chronic lower extremity wound, and GERD; who presents from rehab after being found to have fever with altered mental status.  History is obtained from the patient and wife and review of records as he is unable to give his own due to being acutely altered.   He had recently been hospitalized from 2/6-2/14 for sepsis secondary to pneumonia and/or chronic leg wounds treated with Zithromax and Rocephin.  At discharge patient was sent to rehab, and had per his wife been improving walking with a rolling walker with planned discharge home sometime next week.  However, around lunchtime patient was noted to be not responding like normally with a temperature of up to 101 F.  He was given Tylenol prior to EMS arrival.  She notes that he has had a mild intermittent cough.  Denies any recent seizure activity to her knowledge.  Reports that while he was at the facility though that they have been giving him more than he normally takes at home.  ED Course: Upon admission into the emergency department patient was seen to have a temperature of 99.75F, O2 saturation 87-102, respiration 14-25, and O2 saturations noted to be 90-100% on room air.  Labs revealed WBC 11, hemoglobin 9.3, potassium 3.3, BUN 28, and creatinine 1.41.  Chest x-ray showing a left basilar opacity suggestive of pneumonia".    Assessment & Plan:   Principal Problem:   Sepsis ([place]) Active Problems:   Hypothyroidism   Seizure disorder ([place])   Atrial fibrillation with RVR ([place])   HCAP (healthcare-associated pneumonia)   Acute metabolic encephalopathy   1-Sepsis; wound infection vs PNA Presents with fever, tachycardia, tachypnea.  Chest x ray ; with infiltrates left.  Continue with vancomycin and cefepime.  Blood cultures no growth to date.  Awaiting MRI. Yesterday not done due  To MS>   Acute hypoxic respiratory failure;  He is complaining of dyspnea. Bilateral wheezing. He is very tachypnea NSL fluids.  IV solumedrol one time dose.  Nebulizer treatments.  Repeated Check x ray negative for pulmonary edema. Persistent left side infiltrates.    Chronic lower extremities wound;  Worsening drainage. Will get MRI.  Had ABI negative recently .  Depending on MRI result will need ortho evaluation.    Acute Metabolic encephalopathy;  Related to infection, vs medications.  Per wife he has been more sleepy today. I got ABG, with normal PH and CO2.  He is alert, MS fluctuates.   Metabolic acidosis;  Started sodium bicarb tablet.  ABG with normal Ph./   Diarrhea; hold laxatives. Monitor.   Chest pain. He reported chest chest pain. Not reliable historian, with his confusion.  EKG with no significant changes Nitroglycerin PRN,. maalox PRN.  Mild elevation troponin. Today he is also complaining of SOB and appears in distress. Will repeat chest x ray and will consult cardiology.   AKI;  Cr Baseline 1.  Cr on admission at 1.4 Hold IV fluids.   A fib ; Persistent  Continue with Cardizem and eliquis.  Complaints of worsening SOB. Cardiology consulted.   Anemia; iron deficiency.  Hb Baseline 9--10.  Started  ferrous sulfate.   HTN Continue with Cardizem.   Seizure;  On Depakote.  Dose confirm  with wife.   Hypothyroidism;  continue with synthroid.   BPH;  Dyslipidemia;  Continue pravastatin and Zetia  Hypokalemia; replete orally.     DVT prophylaxis: eliquis.  Code Status: full code.  Family Communication: care discussed with patient 's wife Disposition Plan: to be determine.   Consultants:   none   Procedures: none   Antimicrobials:   Vancomycin and cefepime    Subjective: He is  complaining of worsening SOB, he appears in mild distress, tachypnea.  Per wife patient was up all night with diarrhea.   Objective: Vitals:   05/26/17 2239 05/26/17 2311 05/27/17 0512 05/27/17 0955  BP:  120/67 128/64 (!) 153/81  Pulse:  82 94   Resp:   17   Temp: 98.1 F (36.7 C)     TempSrc: Oral     SpO2:   99%   Weight:      Height:        Intake/Output Summary (Last 24 hours) at 05/27/2017 1048 Last data filed at 05/26/2017 1800 Gross per 24 hour  Intake 1759.17 ml  Output 1150 ml  Net 609.17 ml   Filed Weights   05/24/17 1732 05/25/17 0519 05/26/17 0500  Weight: 76.7 kg (169 lb 3.2 oz) 80 kg (176 lb 5.9 oz) 83.2 kg (183 lb 6.8 oz)    Examination:  General exam: Mild distress.  Respiratory system: Increase work of breathing, bilateral wheezing.  Cardiovascular system: S 1, S 2 RRR Gastrointestinal system: BS present, soft, nt Central nervous system: wake up answer questions.  Extremities: left big toe with dry wound, less  redness. Right LE with 2 wounds, with purulent drainage.  Skin: No rash     Data Reviewed: I have personally reviewed following labs and imaging studies  CBC: Recent Labs  Lab 05/23/17 2143 05/25/17 0527 05/26/17 0809 05/26/17 1344 05/27/17 0824  WBC 11.0* 7.7 8.0  --  8.3  NEUTROABS 8.7* 5.9  --   --   --   HGB 9.3* 8.1* 8.1* 8.2* 8.8*  HCT 29.9* 26.7* 26.6* 26.6* 28.3*  MCV 100.7* 101.5* 102.3*  --  101.8*  PLT 158 125* 126*  --  027*   Basic Metabolic Panel: Recent Labs  Lab 05/23/17 2143 05/25/17 0527 05/26/17 0809 05/26/17 0948 05/26/17 1317 05/27/17 0824  NA 143 143 141  --  142 142  K 3.3* 3.4* 4.7  --  4.5 3.6  CL 110 114* 116*  --  115* 112*  CO2 23 20* 17*  --  21* 22  GLUCOSE 100* 93 84  --  104* 90  BUN 28* 27* 29*  --  28* 23*  CREATININE 1.41* 1.19 1.05  --  1.02 1.00  CALCIUM 7.9* 7.2* 7.4*  --  7.4* 7.6*  MG  --   --   --  1.9  --   --    GFR: Estimated Creatinine Clearance: 59.7 mL/min (by C-G  formula based on SCr of 1 mg/dL). Liver Function Tests: Recent Labs  Lab 05/23/17 2143  AST 21  ALT 17  ALKPHOS 50  BILITOT 0.7  PROT 5.0*  ALBUMIN 2.9*   No results for input(s): LIPASE, AMYLASE in the last 168 hours. No results for input(s): AMMONIA in the last 168 hours. Coagulation Profile: Recent Labs  Lab 05/23/17 2143  INR 1.54   Cardiac Enzymes: Recent Labs  Lab 05/26/17 1344 05/26/17 1927 05/27/17 0225  TROPONINI 0.07* 0.06* 0.08*   BNP (last 3 results) No results for input(s): PROBNP  in the last 8760 hours. HbA1C: No results for input(s): HGBA1C in the last 72 hours. CBG: Recent Labs  Lab 05/23/17 2140 05/26/17 1308 05/26/17 2057  GLUCAP 91 98 116*   Lipid Profile: No results for input(s): CHOL, HDL, LDLCALC, TRIG, CHOLHDL, LDLDIRECT in the last 72 hours. Thyroid Function Tests: No results for input(s): TSH, T4TOTAL, FREET4, T3FREE, THYROIDAB in the last 72 hours. Anemia Panel: No results for input(s): VITAMINB12, FOLATE, FERRITIN, TIBC, IRON, RETICCTPCT in the last 72 hours. Sepsis Labs: Recent Labs  Lab 05/23/17 2210 05/24/17 0114  LATICACIDVEN 1.39 1.23    Recent Results (from the past 240 hour(s))  Aerobic Culture (superficial specimen)     Status: None   Collection Time: 05/21/17 12:15 PM  Result Value Ref Range Status   Specimen Description   Final    TOE LEFT GREAT Performed at [facility] [address], [place], [place] [address]    Special Requests   Final    NONE Performed at [facility], [place] [address], [place], [place] [address]    Gram Stain   Final    NO WBC SEEN FEW GRAM POSITIVE COCCI Performed at [facility], [place] [address] [place], [place] [address]    Culture   Final    MODERATE METHICILLIN RESISTANT STAPHYLOCOCCUS AUREUS   Report Status 05/24/2017 FINAL  Final   Organism ID, Bacteria METHICILLIN RESISTANT STAPHYLOCOCCUS AUREUS  Final      Susceptibility    Methicillin resistant staphylococcus aureus - MIC*    CIPROFLOXACIN >=8 RESISTANT Resistant     ERYTHROMYCIN >=8 RESISTANT Resistant     GENTAMICIN <=0.5 SENSITIVE Sensitive     OXACILLIN >=4 RESISTANT Resistant     TETRACYCLINE <=1 SENSITIVE Sensitive     VANCOMYCIN 1 SENSITIVE Sensitive     TRIMETH/SULFA <=10 SENSITIVE Sensitive     CLINDAMYCIN >=8 RESISTANT Resistant     RIFAMPIN <=0.5 SENSITIVE Sensitive     Inducible Clindamycin NEGATIVE Sensitive     * MODERATE METHICILLIN RESISTANT STAPHYLOCOCCUS AUREUS  Culture, blood (Routine x 2)     Status: None (Preliminary result)   Collection Time: 05/23/17  9:45 PM  Result Value Ref Range Status   Specimen Description BLOOD RIGHT FOREARM  Final   Special Requests   Final    BOTTLES DRAWN AEROBIC AND ANAEROBIC Blood Culture adequate volume   Culture   Final    NO GROWTH 2 DAYS Performed at [facility], [address] [place], [place] [address]    Report Status PENDING  Incomplete  Culture, blood (Routine x 2)     Status: None (Preliminary result)   Collection Time: 05/23/17  9:49 PM  Result Value Ref Range Status   Specimen Description BLOOD LEFT ARM  Final   Special Requests   Final    BOTTLES DRAWN AEROBIC AND ANAEROBIC Blood Culture adequate volume   Culture   Final    NO GROWTH 2 DAYS Performed at [facility], [address] [place], [place] [address]    Report Status PENDING  Incomplete  Urine Culture     Status: None   Collection Time: 05/24/17 12:14 AM  Result Value Ref Range Status   Specimen Description URINE, CATHETERIZED  Final   Special Requests NONE  Final   Culture   Final    NO GROWTH Performed at [facility], [place] [address] [place],  [address]    Report Status 05/25/2017  FINAL  Final  Respiratory Panel by PCR     Status: None   Collection Time: 05/24/17  4:56 AM  Result Value Ref Range Status   Adenovirus NOT DETECTED NOT DETECTED Final   Coronavirus 229E NOT DETECTED NOT  DETECTED Final   Coronavirus HKU1 NOT DETECTED NOT DETECTED Final   Coronavirus NL63 NOT DETECTED NOT DETECTED Final   Coronavirus OC43 NOT DETECTED NOT DETECTED Final   Metapneumovirus NOT DETECTED NOT DETECTED Final   Rhinovirus / Enterovirus NOT DETECTED NOT DETECTED Final   Influenza A NOT DETECTED NOT DETECTED Final   Influenza B NOT DETECTED NOT DETECTED Final   Parainfluenza Virus 1 NOT DETECTED NOT DETECTED Final   Parainfluenza Virus 2 NOT DETECTED NOT DETECTED Final   Parainfluenza Virus 3 NOT DETECTED NOT DETECTED Final   Parainfluenza Virus 4 NOT DETECTED NOT DETECTED Final   Respiratory Syncytial Virus NOT DETECTED NOT DETECTED Final   Bordetella pertussis NOT DETECTED NOT DETECTED Final   Chlamydophila pneumoniae NOT DETECTED NOT DETECTED Final   Mycoplasma pneumoniae NOT DETECTED NOT DETECTED Final    Comment: Performed at [facility], [place] [address] [place], [place] [address]         Radiology Studies: No results found.      Scheduled Meds: . apixaban  5 mg Oral BID  . calcium carbonate  1,250 mg Oral BID WC  . collagenase   Topical Daily  . diltiazem  300 mg Oral Daily  . divalproex  250 mg Oral Daily   And  . divalproex  750 mg Oral QHS  . doxazosin  4 mg Oral Daily  . ezetimibe  5 mg Oral QHS  . feeding supplement (ENSURE ENLIVE)  237 mL Oral BID BM  . feeding supplement (PRO-STAT SUGAR FREE 64)  30 mL Oral BID  . ferrous sulfate  325 mg Oral BID WC  . ipratropium-albuterol  3 mL Nebulization Q6H  . levothyroxine  150 mcg Oral QAC breakfast  . metoprolol tartrate  12.5 mg Oral BID  .  morphine injection  1 mg Intravenous Once  . pantoprazole (PROTONIX) IV  40 mg Intravenous Q12H  . pravastatin  40 mg Oral QHS  . predniSONE  10 mg Oral Q breakfast  . sodium bicarbonate  1,300 mg Oral BID  . zinc sulfate  220 mg Oral q morning - 10a   Continuous Infusions: . ceFEPime (MAXIPIME) IV Stopped (05/26/17 2346)  . vancomycin Stopped (05/26/17  2240)     LOS: 3 days    Time spent: 35 minutes.     Elmarie Shiley, MD [facility] Pager [phone]  If 7PM-7AM, please contact night-coverage [email or web address] Password [facility] 05/27/2017, 10:48 AM

## 2017-05-27 NOTE — Progress Notes (Signed)
RN paged C. Bodenheimer, NP to make him aware Troponin level is 0.08 at this time, awaiting response.  P.J. Linus Mako, RN

## 2017-05-27 NOTE — Consult Note (Addendum)
Cardiology Consultation:   Patient ID: [name]; [id number]; Apr 02, 1933   Admit date: 05/23/2017 Date of Consult: 05/27/2017  Primary Care Provider: [name], MD Primary Cardiologist: Skeet Latch, MD   Patient Profile:   [name] is a 84 y.o. male with a hx of hypertension, previous distant RCA stent, hyperlipidemia, diastolic CHF, atrial fibrillation on chronic anticoagulation, seizures, hypothyroidism, dementia, chronic lower extremity wound, CKD stage III and GERD who is being seen today for the evaluation of chest pain and elevated troponin level at the request of Dr. Tyrell Antonio.  History of Present Illness:   [name] was recently hospitalized 2/6-2/14/19 for sepsis secondary to pneumonia and/or chronic leg wounds treated with Zithromax and Rocephin.  At discharge the patient was sent to rehab and had been improving, walking with a rolling walker and planned discharge sometime next week.  On 05/23/17 he was noted to have a fever up to 101 degrees, mild intermittent cough and altered mental status.  He was admitted for sepsis secondary to healthcare associated pneumonia, acute metabolic encephalopathy, and acute kidney injury.  Apparently the patient reported some chest pain however he was not a reliable historian with his confusion.  EKG showed no significant changes.  Troponins were ordered and have been mildly elevated in a flat pattern 0.07, 0.06, 0.08.  Of note, the patient's troponins were elevated at 0.35 and 0.49 in February of this year during admission for sepsis and brief A. fib with RVR.  Nursing progress Notes reference a run of 10 beats of V. tach last evening with the patient being asymptomatic.  The patient was seen for hospital follow-up after admission for sepsis with A. fib RVR on 05/02/17 by Rosaria Ferries, PA at which time the patient had continued weakness and some dyspnea on exertion but no chest pain as well as poor oral intake.  The patient's blood  pressure at that visit was 78/42.  The patient's losartan was discontinued.  His metoprolol was switched to long-acting formulation and Cardizem was switched to bedtime dosing to try to minimize accumulative effect of the drugs on his blood pressure.  His heart rate was well controlled.  Initial EKG on 05/23/17 showed atrial fibrillation at 99 bpm with prolonged QT interval 502.  Follow-up EKGs with no significant changes.   Today on my exam the patient has improved mental status although he is a poor historian and vague about his symptoms.  He denies having had any chest pain or discomfort.  He is lying almost flat on my exam without any dyspnea.  His wife is present and reports that he has had mild increase in dyspnea on exertion.  Apparently at rehab he had been making good progress and the staff had taken him to his home to assess his walking up the driveway and his home environment.  They took him back to the rehab facility and plan for discharge the following week with continued home therapy.  The day after his trip home his wife says that he became very weak, confused and had fever.  She says the provider at the facility noticed abnormal breath sounds in his left base.  The patient's wife is concerned about the patient's chronic back pain and right leg pain and the fact that his pain medications have been scaled back due to respiratory status.   Past Medical History:  Diagnosis Date  . Abnormality of gait 09/22/2015  . Arthritis   . Bruises easily   . Cancer ([place])  skin CA removed from back  . Chronic insomnia 03/30/2015  . Complication of anesthesia    trouble waking up  . Constipation    from medications taken  . GERD (gastroesophageal reflux disease)   . Hypercholesteremia   . Hypertension   . Hypothyroidism   . Memory difficulty 09/22/2015  . Osteoarthritis   . Transient alteration of awareness 03/30/2015  . Vertigo    hx of    Past Surgical History:  Procedure Laterality Date    . BACK SURGERY    . CARDIAC CATHETERIZATION     Stent X 1  . CERVIX SURGERY    . CORONARY ANGIOPLASTY    . EYE SURGERY     Bilateral Cataract surgery   . HERNIA REPAIR    . I&D EXTREMITY Right 05/10/2015   Procedure: IRRIGATION AND DEBRIDEMENT EXTREMITY;  Surgeon: Leanora Cover, MD;  Location: [facility];  Service: Orthopedics;  Laterality: Right;  . KNEE ARTHROPLASTY     right knee X 2; left knee once  . LAMINECTOMY     X 6  . POSTERIOR CERVICAL FUSION/FORAMINOTOMY  01/28/2012   Procedure: POSTERIOR CERVICAL FUSION/FORAMINOTOMY LEVEL 3;  Surgeon: [name], MD;  Location: [facility];  Service: Neurosurgery;  Laterality: Left;  Posterior Cervical Five-Thoracic One Fusion, Arthrodesis with LEFT Cervical Seven-thoracic One Laminectomy, Foraminotomy and Resection of Synovial Cyst  . POSTERIOR CERVICAL FUSION/FORAMINOTOMY N/A 01/29/2013   Procedure: POSTERIOR CERVICAL FUSION/FORAMINOTOMY LEVEL 1 and C2-5 Posteriolateral Arthrodesis;  Surgeon: [name], MD;  Location: [facility];  Service: Neurosurgery;  Laterality: N/A;  C2-C3 Laminectomy C2-C3 posterior cervical arthrodesis  . TONSILLECTOMY       Home Medications:  Prior to Admission medications   Medication Sig Start Date End Date Taking? Authorizing Provider  acetaminophen (TYLENOL) 325 MG tablet Take 650 mg by mouth every 4 (four) hours as needed for mild pain.   Yes [provider]  alendronate (FOSAMAX) 70 MG tablet Take 70 mg by mouth once a week.  01/06/12  Yes [provider]  Amino Acids-Protein Hydrolys (FEEDING SUPPLEMENT, PRO-STAT SUGAR FREE 64,) LIQD Take 30 mLs by mouth 2 (two) times daily.   Yes [provider]  apixaban (ELIQUIS) 5 MG TABS tablet Take 1 tablet (5 mg total) by mouth 2 (two) times daily. 04/25/17  Yes Lavina Hamman, MD  calcium carbonate (OS-CAL) 600 MG TABS tablet Take 600 mg by mouth 2 (two) times daily with a meal.   Yes [provider]   Cholecalciferol (VITAMIN D-3) 1000 units CAPS Take 1,000 Units by mouth 2 (two) times daily.   Yes [provider]  Coenzyme Q10 (COQ10) 200 MG CAPS Take 200 mg by mouth at bedtime.   Yes [provider]  cyanocobalamin 500 MCG tablet Take 500 mcg by mouth at bedtime.   Yes [provider]  diltiazem (CARDIZEM CD) 300 MG 24 hr capsule Take 1 capsule (300 mg total) by mouth daily. 04/26/17  Yes Lavina Hamman, MD  divalproex (DEPAKOTE) 250 MG DR tablet Take 3 tablets (750 mg total) by mouth every 12 (twelve) hours. Patient taking differently: Take 750 mg by mouth 2 (two) times daily.  07/09/15  Yes Robbie Lis, MD  docusate sodium (COLACE) 100 MG capsule Take 200 mg by mouth 2 (two) times daily.    Yes [provider]  doxazosin (CARDURA) 4 MG tablet Take 1 tablet (4 mg total) by mouth daily. 04/26/17  Yes Berle Mull  M, MD  furosemide (LASIX) 40 MG tablet Take 1 tablet (40 mg total) by mouth 2 (two) times a week. ONLY 05/02/17  Yes Barrett, Evelene Croon, PA-C  gabapentin (NEURONTIN) 300 MG capsule Take 300 mg by mouth 2 (two) times daily.   Yes [provider]  HYDROcodone-acetaminophen (NORCO) 10-325 MG tablet Take 1 tablet by mouth every 6 (six) hours as needed for moderate pain. Patient taking differently: Take 0.5 tablets by mouth every 6 (six) hours as needed for moderate pain.  04/25/17  Yes Lavina Hamman, MD  levothyroxine (SYNTHROID, LEVOTHROID) 150 MCG tablet Take 150 mcg by mouth daily before breakfast. 12/27/11  Yes [provider]  lidocaine (LIDODERM) 5 % Place 1 patch onto the skin daily. Remove & Discard patch within 12 hours or as directed by MD   Yes [provider]  metoprolol succinate (TOPROL-XL) 100 MG 24 hr tablet Take 1 tablet (100 mg total) by mouth daily. Take with or immediately following a meal. 05/02/17 08/30/17 Yes Barrett, Evelene Croon, PA-C  Multiple Vitamin (MULTIVITAMIN WITH MINERALS) TABS tablet Take 1 tablet  by mouth at bedtime.   Yes [provider]  nitroGLYCERIN (NITROSTAT) 0.4 MG SL tablet Place 0.4 mg under the tongue every 5 (five) minutes as needed for chest pain. Reported on 07/20/2015   Yes [provider]  nutrition supplement, JUVEN, (JUVEN) PACK Take 1 packet by mouth 2 (two) times daily between meals.   Yes [provider]  omeprazole (PRILOSEC) 20 MG capsule Take 20 mg by mouth 2 (two) times daily before a meal.  12/23/11  Yes [provider]  polyethylene glycol (MIRALAX / GLYCOLAX) packet Take 17 g by mouth daily as needed for moderate constipation. 04/25/17  Yes Lavina Hamman, MD  pravastatin (PRAVACHOL) 40 MG tablet Take 40 mg by mouth at bedtime.  10/29/14  Yes [provider]  predniSONE (DELTASONE) 10 MG tablet Take 1 tablet (10 mg total) by mouth daily with breakfast. 04/28/17  Yes Lavina Hamman, MD  senna (SENOKOT) 8.6 MG tablet Take 1 tablet by mouth 2 (two) times daily.    Yes [provider]  vitamin A 8000 UNIT capsule Take 8,000 Units by mouth 2 (two) times daily.   Yes [provider]  vitamin C (ASCORBIC ACID) 500 MG tablet Take 500 mg by mouth every morning.   Yes [provider]  ZETIA 10 MG tablet Take 5 mg by mouth at bedtime.  10/15/11  Yes [provider]  zinc gluconate 50 MG tablet Take 50 mg by mouth every morning.   Yes [provider]  QUEtiapine (SEROQUEL) 25 MG tablet Take 0.5-1 tablets (12.5-25 mg total) by mouth 2 (two) times daily. 12.5 mg in AM and 25 mg in PM 04/25/17   Lavina Hamman, MD    Inpatient Medications: Scheduled Meds: . apixaban  5 mg Oral BID  . calcium carbonate  1,250 mg Oral BID WC  . collagenase   Topical Daily  . diltiazem  300 mg Oral Daily  . divalproex  250 mg Oral Daily   And  . divalproex  750 mg Oral QHS  . doxazosin  4 mg Oral Daily  . ezetimibe  5 mg Oral QHS  . feeding supplement (ENSURE ENLIVE)  237 mL Oral BID BM  . feeding  supplement (PRO-STAT SUGAR FREE 64)  30 mL Oral BID  . ferrous sulfate  325 mg Oral BID WC  . ipratropium-albuterol  3 mL  Nebulization Q6H  . levothyroxine  150 mcg Oral QAC breakfast  . methylPREDNISolone (SOLU-MEDROL) injection  40 mg Intravenous Once  . metoprolol tartrate  12.5 mg Oral BID  .  morphine injection  1 mg Intravenous Once  . pantoprazole (PROTONIX) IV  40 mg Intravenous Q12H  . pravastatin  40 mg Oral QHS  . predniSONE  10 mg Oral Q breakfast  . sodium bicarbonate  1,300 mg Oral BID  . zinc sulfate  220 mg Oral q morning - 10a   Continuous Infusions: . ceFEPime (MAXIPIME) IV 2 g (05/27/17 1203)  . vancomycin Stopped (05/26/17 2240)   PRN Meds: acetaminophen **OR** acetaminophen, HYDROcodone-acetaminophen, nitroGLYCERIN, ondansetron **OR** ondansetron (ZOFRAN) IV  Allergies:    Allergies  Allergen Reactions  . Demerol [Meperidine] Nausea And Vomiting  . Keppra [Levetiracetam] Other (See Comments)    Causes sleepiness    Social History:   Social History   Socioeconomic History  . Marital status: Married    Spouse name: [name]  . Number of children: 3  . Years of education: 31  . Highest education level: Not on file  Social Needs  . Financial resource strain: Not on file  . Food insecurity - worry: Not on file  . Food insecurity - inability: Not on file  . Transportation needs - medical: Not on file  . Transportation needs - non-medical: Not on file  Occupational History  . Occupation: retired Software engineer  Tobacco Use  . Smoking status: Former Research scientist (life sciences)  . Smokeless tobacco: Never Used  Substance and Sexual Activity  . Alcohol use: No    Comment: rare  . Drug use: No  . Sexual activity: Not on file  Other Topics Concern  . Not on file  Social History Narrative   Lives at home w/ his wife [name]   Patient drinks 4-5 cups of coffee daily.   Patient is right handed.     Family History:    Family History  Problem Relation Age of Onset  .  Hypertension Mother   . Cancer Mother   . Kidney failure Father   . Heart disease Father      ROS:  Please see the history of present illness.   All other ROS reviewed and negative.     Physical Exam/Data:   Vitals:   05/27/17 0512 05/27/17 0955 05/27/17 1118 05/27/17 1138  BP: 128/64 (!) 153/81 (!) 145/78   Pulse: 94     Resp: 17     Temp:      TempSrc:      SpO2: 99%   99%  Weight:      Height:        Intake/Output Summary (Last 24 hours) at 05/27/2017 1250 Last data filed at 05/27/2017 1113 Gross per 24 hour  Intake 1759.17 ml  Output 2150 ml  Net -390.83 ml   Filed Weights   05/24/17 1732 05/25/17 0519 05/26/17 0500  Weight: 169 lb 3.2 oz (76.7 kg) 176 lb 5.9 oz (80 kg) 183 lb 6.8 oz (83.2 kg)   Body mass index is 28.73 kg/m.  General: Chronically ill-appearing, in no acute distress HEENT: normal Lymph: no adenopathy Neck: Mild JVD Endocrine:  No thryomegaly Vascular: No carotid bruits;  Cardiac:  normal S1, S2; irregularly irregular rhythm, no murmur or gallop Lungs:  clear to auscultation bilaterally, no wheezing, rhonchi or rales  Abd: soft, nontender, no hepatomegaly  Ext: Trace lower extremity edema Musculoskeletal:  No deformities.  Left great toe with small healing  ulceration.  Right lower leg with foam dressing, foul odor detected Skin: warm and dry  Neuro:  CNs 2-12 intact, no focal abnormalities noted, poor memory Psych:  Normal affect   EKG:  The EKG was personally reviewed and demonstrates:  atrial fibrillation at 99 bpm with prolonged QT interval 502.  Follow-up EKGs with no significant changes.  Telemetry:  Telemetry was personally reviewed and demonstrates: Atrial fibrillation in the 80s to low 100s.  Patient had 8 beats of nonsustained V. tach at 2219 last night.  Relevant CV Studies:  Echocardiogram 04/18/17 Study Conclusions - Left ventricle: The cavity size was normal. Wall thickness was   increased in a pattern of moderate LVH. Systolic  function was   normal. The estimated ejection fraction was in the range of 60%   to 65%. Wall motion was normal; there were no regional wall   motion abnormalities. - Mitral valve: There was mild regurgitation. - Left atrium: The atrium was moderately to severely dilated. - Right atrium: The atrium was moderately dilated. - Pulmonary arteries: Systolic pressure was mildly to moderately   increased. PA peak pressure: 37 mm Hg (S).   Laboratory Data:  Chemistry Recent Labs  Lab 05/26/17 0809 05/26/17 1317 05/27/17 0824  NA 141 142 142  K 4.7 4.5 3.6  CL 116* 115* 112*  CO2 17* 21* 22  GLUCOSE 84 104* 90  BUN 29* 28* 23*  CREATININE 1.05 1.02 1.00  CALCIUM 7.4* 7.4* 7.6*  GFRNONAA >60 >60 >60  GFRAA >60 >60 >60  ANIONGAP 8 6 8     Recent Labs  Lab 05/23/17 2143  PROT 5.0*  ALBUMIN 2.9*  AST 21  ALT 17  ALKPHOS 50  BILITOT 0.7   Hematology Recent Labs  Lab 05/25/17 0527 05/26/17 0809 05/26/17 1344 05/27/17 0824  WBC 7.7 8.0  --  8.3  RBC 2.63* 2.60*  --  2.78*  HGB 8.1* 8.1* 8.2* 8.8*  HCT 26.7* 26.6* 26.6* 28.3*  MCV 101.5* 102.3*  --  101.8*  MCH 30.8 31.2  --  31.7  MCHC 30.3 30.5  --  31.1  RDW 15.2 15.2  --  15.1  PLT 125* 126*  --  136*   Cardiac Enzymes Recent Labs  Lab 05/26/17 1344 05/26/17 1927 05/27/17 0225  TROPONINI 0.07* 0.06* 0.08*   No results for input(s): TROPIPOC in the last 168 hours.  BNPNo results for input(s): BNP, PROBNP in the last 168 hours.  DDimer No results for input(s): DDIMER in the last 168 hours.  Radiology/Studies:  Dg Chest 2 View  Result Date: 05/24/2017 CLINICAL DATA:  84 year old male with fever.  Pneumonia. EXAM: CHEST - 2 VIEW COMPARISON:  Chest radiograph dated 04/23/2017 FINDINGS: There is shallow inspiration. Left lung base density may represent atelectasis versus infiltrate. Clinical correlation is recommended. Small left pleural effusion may be present. Interval clearing of the right lung base densities  compared to prior radiograph. There is no pneumothorax. Stable cardiomegaly. No acute osseous pathology. IMPRESSION: Left lung base atelectasis versus infiltrate. Interval clearing of the right lung base densities compared to prior radiograph. Electronically Signed   By: Anner Crete M.D.   On: 05/24/2017 00:03   Dg Ankle Complete Right  Result Date: 05/24/2017 CLINICAL DATA:  Chronic right ankle wound.  Fever. EXAM: RIGHT ANKLE - COMPLETE 3+ VIEW COMPARISON:  Right ankle radiographs performed 04/09/2017 FINDINGS: There is no evidence of fracture or dislocation. No osseous erosions are seen. The ankle mortise is intact; the interosseous space  is within normal limits. No talar tilt or subluxation is seen. Calcification is noted along the plantar fascia. A plantar calcaneal spur is noted. Scattered vascular calcifications are seen. The joint spaces are preserved. Soft tissue air is noted along the medial aspect of the lower leg, reflecting a soft tissue ulceration. No radiopaque foreign bodies are seen. IMPRESSION: 1. No osseous erosions seen. 2. No radiopaque foreign bodies seen. 3. Scattered vascular calcifications noted. Electronically Signed   By: Garald Balding M.D.   On: 05/24/2017 00:07   Dg Chest Port 1 View  Result Date: 05/27/2017 CLINICAL DATA:  Chest pain and worsening shortness of breath. EXAM: PORTABLE CHEST 1 VIEW COMPARISON:  Chest x-ray dated May 23, 2017. FINDINGS: Stable cardiomegaly. Persistent low lung volumes and small left pleural effusion. Apparent new hazy density in the right mid lung is likely secondary to overlapping scapula. Unchanged patchy density at the left lung base. No pneumothorax. No acute osseous abnormality. Old right-sided rib fractures. IMPRESSION: Unchanged left lower lobe atelectasis versus infiltrate and small left pleural effusion. Electronically Signed   By: Titus Dubin M.D.   On: 05/27/2017 11:23   Dg Foot Complete Left  Result Date:  05/24/2017 CLINICAL DATA:  Acute onset of lethargy and fever. Chronic left great toe wound. EXAM: LEFT FOOT - COMPLETE 3+ VIEW COMPARISON:  Left foot radiographs performed 06/13/2016 FINDINGS: There is no evidence of fracture or dislocation. No osseous erosions are seen. The joint spaces are preserved. There is no evidence of talar subluxation; the subtalar joint is unremarkable in appearance. Plantar and posterior calcaneal spurs are seen. The left great toe wound is not well characterized. No radiopaque foreign bodies are identified. IMPRESSION: No evidence of fracture or dislocation.  No osseous erosions seen. Electronically Signed   By: Garald Balding M.D.   On: 05/24/2017 00:03    Assessment and Plan:   Elevated troponins -Patient still mildly confused.  Denies any complaints of chest pain/pressure/tightness -Troponins mildly elevated at 0.07, 0.06, 0.08.  Troponins were elevated at 0.35 and 0.49 in February during admission for pneumonia/sepsis with associated A. fib with RVR. -Troponin elevation likely related to demand ischemia in setting of sepsis related to pneumonia versus wound infection. I do not think this is ACS and no need for ischemic evaluation at this time.    Chronic diastolic CHF -Most recent echocardiogram 04/18/17 showed moderate LVH with normal LV systolic function, EF 58-52%, no regional wall motion abnormalities, mild MR, moderately to severely dilated left atrium and moderately dilated right atrium, PA peak pressure 37 mmHg -ARB was discontinued in February due to hypotension.  The patient continues on metoprolol and Cardizem as well as Lasix 40 mg 2 times per week.  -Chest x-ray with left lower lobe atelectasis versus infiltrate and small left pleural effusion -No significant orthopnea.  He does have dyspnea on exertion according to his wife.  He has mild JVD present. -Weight is up today from 176 pounds yesterday to 183 pounds today.  Patient has been receiving IV fluids at  75 mL/h and he is +3.2 L fluid balance since admission.  -The patient has not been receiving his home dose of Lasix 40 mg 2 times per week.  Blood pressures mildly elevated and could probably sustain some gentle diuresis.  Recommend to give Lasix 20 mg IV x1 and resume his home regimen.  Persistent atrial fibrillation -Currently maintaining atrial fibrillation with fair rate control on Cardizem CD 300 mg daily and metoprolol 12.5 mg twice daily.  Prior to admission the patient was on Toprol XL 100 mg daily and diltiazem 300 mg daily.  Blood pressure is adequate to allow for increase in metoprolol.   -CHA2DS2/VAS Stroke Risk Score 4 (CHF, HTN,Age (2)).  The patient is anticoagulated with Eliquis 5 mg twice daily.     Sepsis -Patient presented with fever, tachycardia, tachypnea, chest x-ray with infiltrates on the left and altered mental status. Also has RLE wounds.  -Management per primary team.  Blood cultures no growth to date.  On vancomycin and cefepime.  Hyperlipidemia -Pravastatin 40 mg daily and Zetia 5 mg daily.  No recent lipid levels in epic.  Management per primary care provider.   Acute on chronic renal insufficiency -Baseline serum creatinine around 1.2-1.6.  Serum creatinine on admission 1.41, improved to 1.00 today  Anemia, iron deficiency -Appears to be chronic.  Hemoglobin down to 8.1, stable today at 8.8.  No obvious source of blood loss -Supplemental iron has been initiated by primary team.   For questions or updates, please contact [facility] Please consult [email or web address] for contact info under Cardiology/STEMI.   Signed, Daune Perch, NP  05/27/2017 12:50 PM   History and all data above reviewed.  Patient examined.  I agree with the findings as above.  The patient presents with presumed sepsis. (Source is not clear).  We are called because of increased enzymes.  There is a mention of chest pain.  However, he denies this with me.  His wife does not report that he  was having this.  He did have increased He is also not reporting increased SOB.  He had weakness and confusion that was somwhat acute.  He had been making slow and steady progress in rehab.  The patient exam reveals KGM:[id number]  ,  Lungs: Decreased breath sounds but without crackles or wheezing  ,  Abd: Positive bowel sounds, no rebound no guarding, Ext Diffuse edema  .  All available labs, radiology testing, previous records reviewed. Agree with documented assessment and plan.   Elevated troponin:  Lower than on previous admission.  No symptoms.  No EKG changes.  Flat enzymes with minimal evaluation.  No further work up.  OK to resume his previous Lasix.  Also, increase metoprolol to 25 mg BID.  He did have Cozaar stopped recently with hypotension.  However, now he has increased atrial rate of his atrial fib and BP will allow med titration.  Atrial fib:  Continue Eliquis and rate control.  He does not feel this rhythm.      Jeneen Rinks Sharronda Schweers  2:58 PM  05/27/2017

## 2017-05-28 ENCOUNTER — Other Ambulatory Visit: Payer: Self-pay

## 2017-05-28 ENCOUNTER — Encounter (HOSPITAL_COMMUNITY): Payer: Self-pay | Admitting: General Practice

## 2017-05-28 DIAGNOSIS — A419 Sepsis, unspecified organism: Principal | ICD-10-CM

## 2017-05-28 LAB — CBC
HCT: 29 % — ABNORMAL LOW (ref 39.0–52.0)
Hemoglobin: 8.9 g/dL — ABNORMAL LOW (ref 13.0–17.0)
MCH: 30.6 pg (ref 26.0–34.0)
MCHC: 30.7 g/dL (ref 30.0–36.0)
MCV: 99.7 fL (ref 78.0–100.0)
Platelets: 148 10*3/uL — ABNORMAL LOW (ref 150–400)
RBC: 2.91 MIL/uL — ABNORMAL LOW (ref 4.22–5.81)
RDW: 14.7 % (ref 11.5–15.5)
WBC: 8.2 10*3/uL (ref 4.0–10.5)

## 2017-05-28 LAB — BASIC METABOLIC PANEL
Anion gap: 10 (ref 5–15)
BUN: 29 mg/dL — ABNORMAL HIGH (ref 6–20)
CO2: 21 mmol/L — ABNORMAL LOW (ref 22–32)
Calcium: 7.8 mg/dL — ABNORMAL LOW (ref 8.9–10.3)
Chloride: 111 mmol/L (ref 101–111)
Creatinine, Ser: 1.08 mg/dL (ref 0.61–1.24)
GFR calc Af Amer: >60 mL/min (ref >60)
GFR calc non Af Amer: >60 mL/min (ref >60)
Glucose, Bld: 135 mg/dL — ABNORMAL HIGH (ref 65–99)
Potassium: 4 mmol/L (ref 3.5–5.1)
Sodium: 142 mmol/L (ref 135–145)

## 2017-05-28 LAB — VANCOMYCIN, TROUGH: Vancomycin Tr: 22 ug/mL (ref 15–20)

## 2017-05-28 LAB — MRSA PCR SCREENING: MRSA by PCR: NEGATIVE

## 2017-05-28 LAB — MAGNESIUM: Magnesium: 1.9 mg/dL (ref 1.7–2.4)

## 2017-05-28 MED ORDER — LIDOCAINE 5 % EX PTCH: 1.0000 | MEDICATED_PATCH | Freq: Every day | CUTANEOUS | Status: DC | PRN

## 2017-05-28 MED ORDER — VANCOMYCIN HCL IN DEXTROSE 1-5 GM/200ML-% IV SOLN
1000.0000 mg | INTRAVENOUS | Status: DC
  Administered 2017-05-29: 1000 mg via INTRAVENOUS
  Filled 2017-05-28: qty 200

## 2017-05-28 MED ORDER — ENSURE ENLIVE PO LIQD
237.0000 mL | Freq: Three times a day (TID) | ORAL | Status: DC
  Administered 2017-05-28 – 2017-06-01 (×11): 237 mL via ORAL

## 2017-05-28 MED ORDER — MAGNESIUM OXIDE 400 (241.3 MG) MG PO TABS
200.0000 mg | ORAL_TABLET | Freq: Two times a day (BID) | ORAL | Status: AC
  Administered 2017-05-28 – 2017-05-29 (×2): 200 mg via ORAL
  Filled 2017-05-28 (×2): qty 1

## 2017-05-28 MED ORDER — GABAPENTIN 300 MG PO CAPS
300.0000 mg | ORAL_CAPSULE | Freq: Two times a day (BID) | ORAL | Status: DC
  Administered 2017-05-28 – 2017-06-01 (×9): 300 mg via ORAL
  Filled 2017-05-28 (×9): qty 1

## 2017-05-28 NOTE — Progress Notes (Signed)
CRITICAL VALUE ALERT  Critical Value:  Vancomycin Trough 22  Date & Time Notified:  05/28/17 2057  Provider Notified:  Pharmacist Anderson Malta, and Provider K. Schorr  Orders Received/Actions taken:  Vanc reduced to 1,000mg  every 24 hours.

## 2017-05-28 NOTE — Progress Notes (Signed)
Patient had a pause of 2.71 secs.  Notified on call triad. Will continue to monitor the patient

## 2017-05-28 NOTE — Consult Note (Signed)
[facility] Nurse wound follow up Wound type:wife had additional questions regarding use of Santyl.  Wanted [place] to scrape it with curette like wound care center does  Explained that is not an inpatient procedure that we perform like the wound care center and that sharps debridement is not indicated at this time.  That Santyl enzymatic debridement is appropriate at this time and she agrees.   Left toes should be dressed and covered with Aquacel Ag per MD order.  Measurement:see 05/27/17 note Wound bed:100% slough to right lower extremity wounds Drainage (amount, consistency, odor) NOne today Periwound:intact Dressing procedure/placement/frequency:COntinue current orders Will not follow at this time.  Please re-consult if needed.  Domenic Moras RN BSN [facility] Pager [phone]

## 2017-05-28 NOTE — Progress Notes (Signed)
No charge note.  Order received for goals of care conversation with family. Chart reviewed.Plan for meeting with wife tomorrow, 3/20 at aa am to discuss goals of care.   Thank you for this consult.  Juel Burrow, DNP, AGNP-C Palliative Medicine Team Team Phone # [phone]

## 2017-05-28 NOTE — Progress Notes (Signed)
Received call from tele that patient had one 4 beat run of V-tach. Patient asymptomatic. MD notified.

## 2017-05-28 NOTE — Progress Notes (Signed)
Progress Note  Patient Name: [name] Date of Encounter: 05/28/2017  Primary Cardiologist: Skeet Latch, MD   Subjective   Feeling better.  Breathing stable.  Just feels poorly in general.   Inpatient Medications    Scheduled Meds: . apixaban  5 mg Oral BID  . calcium carbonate  1,250 mg Oral BID WC  . collagenase   Topical Daily  . diltiazem  300 mg Oral Daily  . divalproex  250 mg Oral Daily   And  . divalproex  750 mg Oral QHS  . doxazosin  4 mg Oral Daily  . ezetimibe  5 mg Oral QHS  . feeding supplement (ENSURE ENLIVE)  237 mL Oral BID BM  . feeding supplement (PRO-STAT SUGAR FREE 64)  30 mL Oral BID  . ferrous sulfate  325 mg Oral BID WC  . furosemide  40 mg Oral Q3 days  . ipratropium-albuterol  3 mL Nebulization TID  . levothyroxine  150 mcg Oral QAC breakfast  . metoprolol tartrate  25 mg Oral BID  .  morphine injection  1 mg Intravenous Once  . pantoprazole (PROTONIX) IV  40 mg Intravenous Q12H  . pravastatin  40 mg Oral QHS  . predniSONE  10 mg Oral Q breakfast  . sodium bicarbonate  1,300 mg Oral BID  . zinc sulfate  220 mg Oral q morning - 10a   Continuous Infusions: . ceFEPime (MAXIPIME) IV Stopped (05/28/17 0851)  . vancomycin Stopped (05/27/17 2130)   PRN Meds: acetaminophen **OR** acetaminophen, HYDROcodone-acetaminophen, nitroGLYCERIN, ondansetron **OR** ondansetron (ZOFRAN) IV   Vital Signs    Vitals:   05/28/17 0305 05/28/17 0457 05/28/17 0807 05/28/17 0817  BP: 139/65   (!) 143/81  Pulse:    94  Resp:      Temp:   97.7 F (36.5 C)   TempSrc:   Oral   SpO2:      Weight:  186 lb 4.6 oz (84.5 kg)    Height:        Intake/Output Summary (Last 24 hours) at 05/28/2017 1010 Last data filed at 05/28/2017 0951 Gross per 24 hour  Intake 340 ml  Output 2350 ml  Net -2010 ml   Filed Weights   05/26/17 0500 05/27/17 1710 05/28/17 0457  Weight: 183 lb 6.8 oz (83.2 kg) 177 lb 4 oz (80.4 kg) 186 lb 4.6 oz (84.5 kg)    Telemetry    Atrial fibrillation.  Rates mostly <100.  - Personally Reviewed  ECG    Na/ - Personally Reviewed  Physical Exam   VS:  BP (!) 143/81   Pulse 94   Temp 97.7 F (36.5 C) (Oral)   Resp 16   Ht 5\' 7"  (1.702 m)   Wt 186 lb 4.6 oz (84.5 kg)   SpO2 99%   BMI 29.18 kg/m  , BMI Body mass index is 29.18 kg/m. GENERAL:  Chronically ill-appearing HEENT: Pupils equal round and reactive, fundi not visualized, oral mucosa unremarkable NECK:  No jugular venous distention, waveform within normal limits, carotid upstroke brisk and symmetric, no bruits LUNGS:  Clear to auscultation bilaterally HEART:  Irregularly irregular.   PMI not displaced or sustained,S1 and S2 within normal limits, no S3, no S4, no clicks, no rubs, no murmurs ABD:  Flat, positive bowel sounds normal in frequency in pitch, no bruits, no rebound, no guarding, no midline pulsatile mass, no hepatomegaly, no splenomegaly EXT:  2 plus pulses throughout, no edema, no cyanosis no clubbing SKIN:  No rashes no nodules NEURO:  Cranial nerves II through XII grossly intact, motor grossly intact throughout [facility]:  Cognitively intact, oriented to person place and time   Labs    Chemistry Recent Labs  Lab 05/23/17 2143  05/26/17 0809 05/26/17 1317 05/27/17 0824  NA 143   < > 141 142 142  K 3.3*   < > 4.7 4.5 3.6  CL 110   < > 116* 115* 112*  CO2 23   < > 17* 21* 22  GLUCOSE 100*   < > 84 104* 90  BUN 28*   < > 29* 28* 23*  CREATININE 1.41*   < > 1.05 1.02 1.00  CALCIUM 7.9*   < > 7.4* 7.4* 7.6*  PROT 5.0*  --   --   --   --   ALBUMIN 2.9*  --   --   --   --   AST 21  --   --   --   --   ALT 17  --   --   --   --   ALKPHOS 50  --   --   --   --   BILITOT 0.7  --   --   --   --   GFRNONAA 45*   < > >60 >60 >60  GFRAA 52*   < > >60 >60 >60  ANIONGAP 10   < > 8 6 8    < > = values in this interval not displayed.     Hematology Recent Labs  Lab 05/25/17 0527 05/26/17 0809 05/26/17 1344 05/27/17 0824  WBC 7.7 8.0   --  8.3  RBC 2.63* 2.60*  --  2.78*  HGB 8.1* 8.1* 8.2* 8.8*  HCT 26.7* 26.6* 26.6* 28.3*  MCV 101.5* 102.3*  --  101.8*  MCH 30.8 31.2  --  31.7  MCHC 30.3 30.5  --  31.1  RDW 15.2 15.2  --  15.1  PLT 125* 126*  --  136*    Cardiac Enzymes Recent Labs  Lab 05/26/17 1344 05/26/17 1927 05/27/17 0225  TROPONINI 0.07* 0.06* 0.08*   No results for input(s): TROPIPOC in the last 168 hours.   BNPNo results for input(s): BNP, PROBNP in the last 168 hours.   DDimer No results for input(s): DDIMER in the last 168 hours.   Radiology    Dg Chest Port 1 View  Result Date: 05/27/2017 CLINICAL DATA:  Chest pain and worsening shortness of breath. EXAM: PORTABLE CHEST 1 VIEW COMPARISON:  Chest x-ray dated May 23, 2017. FINDINGS: Stable cardiomegaly. Persistent low lung volumes and small left pleural effusion. Apparent new hazy density in the right mid lung is likely secondary to overlapping scapula. Unchanged patchy density at the left lung base. No pneumothorax. No acute osseous abnormality. Old right-sided rib fractures. IMPRESSION: Unchanged left lower lobe atelectasis versus infiltrate and small left pleural effusion. Electronically Signed   By: Titus Dubin M.D.   On: 05/27/2017 11:23    Cardiac Studies   Echo 05/28/17: Study Conclusions  - Left ventricle: The cavity size was normal. Wall thickness was   increased in a pattern of moderate LVH. Systolic function was   normal. The estimated ejection fraction was in the range of 60%   to 65%. Wall motion was normal; there were no regional wall   motion abnormalities. - Mitral valve: There was mild regurgitation. - Left atrium: The atrium was moderately to severely dilated. - Right atrium: The atrium was  moderately dilated. - Pulmonary arteries: Systolic pressure was mildly to moderately   increased. PA peak pressure: 37 mm Hg (S).  Patient Profile     84 y.o. male with CAD s/p PCI, hypertension, hyperlipidemia, CKD III,  dementia, seizure disorder and hypothyroidism here with CAP and sepsis.  Assessment & Plan    # Paroxysmal atrial fibrillation: Rates well-controlled.  There is mention of a 2.2 second pause at 1:30.  It was 1.4 seconds.  There was a PVC between these beats.  Not clinically significant. Continue Eliquis, diltiazem, and metoprolol.  # Hypertension: Blood pressure is much better controlled.  Diltiazem was started this admission.  We will stop amlodipine and increase diltiazem to 300mg  daily. Continue doxazosin, lasix, and losartan.  # Acute on chronic  diastolic heart failure:  Volume status is much better. Resumed oral lasix today.    # CAD s/p PCI: Not an active issue.   Continue metoprolol and Zetia and pravastatin.    # Poor oral intake: His wife is concerned that he is not eating and has no will to live.  Suggested she keep brining foods he likes and encouarge him.  Dr. Tyrell Antonio will order supplements and call Palliative Care.     For questions or updates, please contact [place] Please consult [email or web address] for contact info under Cardiology/STEMI.      Signed, Skeet Latch, MD  05/28/2017, 10:10 AM

## 2017-05-28 NOTE — Progress Notes (Signed)
Pharmacy Antibiotic Note  [name] is a 84 y.o. male admitted on 05/23/2017 with fever and AMS.  Pharmacy has been consulted for vancomycin dosing for sepsis due to pneumonia and LE wound.   Vancomycin trough is above goal at 22. Renal function stable over last 3 days. [place] discussion planned for tomorrow.  Plan: Decrease vancomycin to 1000 mg IV q24h Monitor renal function and clinical progress F/U LOT  Height: 5\' 7"  (170.2 cm) Weight: 186 lb 4.6 oz (84.5 kg) IBW/kg (Calculated) : 66.1  Temp (24hrs), Avg:97.7 F (36.5 C), Min:97.7 F (36.5 C), Max:97.7 F (36.5 C)  Recent Labs  Lab 05/23/17 2143 05/23/17 2210 05/24/17 0114 05/25/17 0527 05/26/17 0809 05/26/17 1317 05/27/17 0824 05/28/17 1008 05/28/17 1928  WBC 11.0*  --   --  7.7 8.0  --  8.3 8.2  --   CREATININE 1.41*  --   --  1.19 1.05 1.02 1.00 1.08  --   LATICACIDVEN  --  1.39 1.23  --   --   --   --   --   --   VANCOTROUGH  --   --   --   --   --   --   --   --  22*    Estimated Creatinine Clearance: 55.8 mL/min (by C-G formula based on SCr of 1.08 mg/dL).    Allergies  Allergen Reactions  . Demerol [Meperidine] Nausea And Vomiting  . Keppra [Levetiracetam] Other (See Comments)    Causes sleepiness    Antimicrobials this admission: Cefepime 3/15>> Vancomycin 3/15>>  Dose adjustments this admission: 3/19 VT 22 on 1250 mg q24 - decr to 1000 mg q24  Microbiology results: 3/15 resp panel - neg 3/15 urine - neg 3/15 blood- ngtd  Thank you for allowing pharmacy to be a part of this patient's care.   Renold Genta, PharmD, BCPS Clinical Pharmacist Clinical phone for 05/28/2017 until 10p is [phone] After 10p, please call Main Rx at [phone] for assistance 05/28/2017 9:05 PM

## 2017-05-28 NOTE — Progress Notes (Signed)
PROGRESS NOTE    [name]  SAY:[id number] DOB: 09/06/1933 DOA: 05/23/2017 PCP: [name], MD   Brief Narrative by Dr Tamala Julian: " [name] is a 84 y.o. male with medical history significant of HTN, HLD, diastolic CHF, atrial fibrillation on chronic anticoagulation, seizures, hypothyroidism, dementia, chronic lower extremity wound, and GERD; who presents from rehab after being found to have fever with altered mental status.  History is obtained from the patient and wife and review of records as he is unable to give his own due to being acutely altered.   He had recently been hospitalized from 2/6-2/14 for sepsis secondary to pneumonia and/or chronic leg wounds treated with Zithromax and Rocephin.  At discharge patient was sent to rehab, and had per his wife been improving walking with a rolling walker with planned discharge home sometime next week.  However, around lunchtime patient was noted to be not responding like normally with a temperature of up to 101 F.  He was given Tylenol prior to EMS arrival.  She notes that he has had a mild intermittent cough.  Denies any recent seizure activity to her knowledge.  Reports that while he was at the facility though that they have been giving him more than he normally takes at home.  ED Course: Upon admission into the emergency department patient was seen to have a temperature of 99.65F, O2 saturation 87-102, respiration 14-25, and O2 saturations noted to be 90-100% on room air.  Labs revealed WBC 11, hemoglobin 9.3, potassium 3.3, BUN 28, and creatinine 1.41.  Chest x-ray showing a left basilar opacity suggestive of pneumonia".  Admitted with confusion, sepsis from PNA vs wound infection from lower extremity. He was started on IV antibiotics. Hospital course complicated by episode of chest pain, subsequently hypoxic respiratory failure, worsening dyspnea. Today patient respiratory status is stable. His MS wax and wane. He has poor oral intake.     Assessment & Plan:   Principal Problem:   Sepsis ([place]) Active Problems:   Hypothyroidism   Seizure disorder ([place])   Atrial fibrillation with RVR ([place])   HCAP (healthcare-associated pneumonia)   Acute metabolic encephalopathy   1-Sepsis; wound infection vs PNA Presents with fever, tachycardia, tachypnea. Chest x ray ; with infiltrates left.  Continue with vancomycin and cefepime.  Blood cultures no growth to date.  He refuse MRI today, will see if he agree to have MRI done tomorrow.   Acute hypoxic respiratory failure;  He is complaining of dyspnea. Bilateral wheezing. He is very tachypnea NSL fluids.  IV solumedrol one time dose.  Nebulizer treatments.  Repeated Check x ray negative for pulmonary edema. Persistent left side infiltrates.  Stable today.    Chronic lower extremities wound;  Worsening drainage. Will get MRI.  Had ABI negative recently .  Depending on MRI result will need ortho evaluation.    Acute Metabolic encephalopathy;  Related to infection, vs medications.  Per wife he has been more sleepy on 3-18. I got ABG, with normal PH and CO2.  He is alert, MS fluctuates.   Metabolic acidosis;  Improved. Hold sodium bicarb ABG with normal Ph./   Diarrhea; hold laxatives. Monitor.   Chest pain. He reported chest chest pain. Not reliable historian, with his confusion.  EKG with no significant changes Nitroglycerin PRN,. maalox PRN.  Mild elevation troponin. He was also  complaining of SOB and appears in distress. Cardiology consulted.   AKI;  Cr Baseline 1.  Cr on admission at 1.4 Hold  IV fluids.   A fib ; Persistent  Continue with Cardizem and eliquis.  Complaints of worsening SOB. Cardiology consulted.   Anemia; iron deficiency.  Hb Baseline 9--10.  Started  ferrous sulfate.   HTN Continue with Cardizem.   Seizure;  On Depakote.  Dose confirm with wife.   Hypothyroidism;  continue with synthroid.   BPH;  Dyslipidemia;  Continue  pravastatin and Zetia  Hypokalemia; replete orally.     DVT prophylaxis: eliquis.  Code Status: full code.  Family Communication: care discussed with patient 's wife Disposition Plan: to be determine.   Consultants:   none   Procedures: none   Antimicrobials:   Vancomycin and cefepime    Subjective: He is alert, poor spirit and appetite. He is breathing better   Objective: Vitals:   05/28/17 0305 05/28/17 0457 05/28/17 0807 05/28/17 0817  BP: 139/65   (!) 143/81  Pulse:    94  Resp:      Temp:   97.7 F (36.5 C)   TempSrc:   Oral   SpO2:      Weight:  84.5 kg (186 lb 4.6 oz)    Height:        Intake/Output Summary (Last 24 hours) at 05/28/2017 1241 Last data filed at 05/28/2017 0951 Gross per 24 hour  Intake 220 ml  Output 1850 ml  Net -1630 ml   Filed Weights   05/26/17 0500 05/27/17 1710 05/28/17 0457  Weight: 83.2 kg (183 lb 6.8 oz) 80.4 kg (177 lb 4 oz) 84.5 kg (186 lb 4.6 oz)    Examination:  General exam: NAD>  Respiratory system: Normal respiratory effort, crackles bases.  Cardiovascular system: S 1, S 2 RRR Gastrointestinal system: BS present, soft, nt Central nervous system: alert, follows command.  Extremities: left big toe with dry wound, less  redness. Right LE with 2 wounds, with purulent drainage.  Skin: No rash     Data Reviewed: I have personally reviewed following labs and imaging studies  CBC: Recent Labs  Lab 05/23/17 2143 05/25/17 0527 05/26/17 0809 05/26/17 1344 05/27/17 0824 05/28/17 1008  WBC 11.0* 7.7 8.0  --  8.3 8.2  NEUTROABS 8.7* 5.9  --   --   --   --   HGB 9.3* 8.1* 8.1* 8.2* 8.8* 8.9*  HCT 29.9* 26.7* 26.6* 26.6* 28.3* 29.0*  MCV 100.7* 101.5* 102.3*  --  101.8* 99.7  PLT 158 125* 126*  --  136* 638*   Basic Metabolic Panel: Recent Labs  Lab 05/25/17 0527 05/26/17 0809 05/26/17 0948 05/26/17 1317 05/27/17 0824 05/28/17 1008  NA 143 141  --  142 142 142  K 3.4* 4.7  --  4.5 3.6 4.0  CL 114* 116*   --  115* 112* 111  CO2 20* 17*  --  21* 22 21*  GLUCOSE 93 84  --  104* 90 135*  BUN 27* 29*  --  28* 23* 29*  CREATININE 1.19 1.05  --  1.02 1.00 1.08  CALCIUM 7.2* 7.4*  --  7.4* 7.6* 7.8*  MG  --   --  1.9  --   --   --    GFR: Estimated Creatinine Clearance: 55.8 mL/min (by C-G formula based on SCr of 1.08 mg/dL). Liver Function Tests: Recent Labs  Lab 05/23/17 2143  AST 21  ALT 17  ALKPHOS 50  BILITOT 0.7  PROT 5.0*  ALBUMIN 2.9*   No results for input(s): LIPASE, AMYLASE in the last 168 hours.  No results for input(s): AMMONIA in the last 168 hours. Coagulation Profile: Recent Labs  Lab 05/23/17 2143  INR 1.54   Cardiac Enzymes: Recent Labs  Lab 05/26/17 1344 05/26/17 1927 05/27/17 0225  TROPONINI 0.07* 0.06* 0.08*   BNP (last 3 results) No results for input(s): PROBNP in the last 8760 hours. HbA1C: No results for input(s): HGBA1C in the last 72 hours. CBG: Recent Labs  Lab 05/23/17 2140 05/26/17 1308 05/26/17 2057  GLUCAP 91 98 116*   Lipid Profile: No results for input(s): CHOL, HDL, LDLCALC, TRIG, CHOLHDL, LDLDIRECT in the last 72 hours. Thyroid Function Tests: No results for input(s): TSH, T4TOTAL, FREET4, T3FREE, THYROIDAB in the last 72 hours. Anemia Panel: No results for input(s): VITAMINB12, FOLATE, FERRITIN, TIBC, IRON, RETICCTPCT in the last 72 hours. Sepsis Labs: Recent Labs  Lab 05/23/17 2210 05/24/17 0114  LATICACIDVEN 1.39 1.23    Recent Results (from the past 240 hour(s))  Aerobic Culture (superficial specimen)     Status: None   Collection Time: 05/21/17 12:15 PM  Result Value Ref Range Status   Specimen Description   Final    TOE LEFT GREAT Performed at [facility] [address], [place], [place] [address]    Special Requests   Final    NONE Performed at [facility], [place] [address], [place], [place] [address]    Gram Stain   Final    NO WBC SEEN FEW GRAM POSITIVE  COCCI Performed at [facility], [place] [address], [place], [place] [address]    Culture   Final    MODERATE METHICILLIN RESISTANT STAPHYLOCOCCUS AUREUS   Report Status 05/24/2017 FINAL  Final   Organism ID, Bacteria METHICILLIN RESISTANT STAPHYLOCOCCUS AUREUS  Final      Susceptibility   Methicillin resistant staphylococcus aureus - MIC*    CIPROFLOXACIN >=8 RESISTANT Resistant     ERYTHROMYCIN >=8 RESISTANT Resistant     GENTAMICIN <=0.5 SENSITIVE Sensitive     OXACILLIN >=4 RESISTANT Resistant     TETRACYCLINE <=1 SENSITIVE Sensitive     VANCOMYCIN 1 SENSITIVE Sensitive     TRIMETH/SULFA <=10 SENSITIVE Sensitive     CLINDAMYCIN >=8 RESISTANT Resistant     RIFAMPIN <=0.5 SENSITIVE Sensitive     Inducible Clindamycin NEGATIVE Sensitive     * MODERATE METHICILLIN RESISTANT STAPHYLOCOCCUS AUREUS  Culture, blood (Routine x 2)     Status: None (Preliminary result)   Collection Time: 05/23/17  9:45 PM  Result Value Ref Range Status   Specimen Description BLOOD RIGHT FOREARM  Final   Special Requests   Final    BOTTLES DRAWN AEROBIC AND ANAEROBIC Blood Culture adequate volume   Culture   Final    NO GROWTH 4 DAYS Performed at [facility], [address], [place], [place] [address]    Report Status PENDING  Incomplete  Culture, blood (Routine x 2)     Status: None (Preliminary result)   Collection Time: 05/23/17  9:49 PM  Result Value Ref Range Status   Specimen Description BLOOD LEFT ARM  Final   Special Requests   Final    BOTTLES DRAWN AEROBIC AND ANAEROBIC Blood Culture adequate volume   Culture   Final    NO GROWTH 4 DAYS Performed at [facility], [address], [place],  [address]    Report Status PENDING  Incomplete  Urine Culture     Status: None   Collection Time: 05/24/17 12:14 AM  Result Value Ref Range Status   Specimen Description URINE, CATHETERIZED  Final   Special Requests NONE  Final   Culture   Final    NO GROWTH Performed at  [facility], [address], [place], [place] [address]    Report Status 05/25/2017 FINAL  Final  Respiratory Panel by PCR     Status: None   Collection Time: 05/24/17  4:56 AM  Result Value Ref Range Status   Adenovirus NOT DETECTED NOT DETECTED Final   Coronavirus 229E NOT DETECTED NOT DETECTED Final   Coronavirus HKU1 NOT DETECTED NOT DETECTED Final   Coronavirus NL63 NOT DETECTED NOT DETECTED Final   Coronavirus OC43 NOT DETECTED NOT DETECTED Final   Metapneumovirus NOT DETECTED NOT DETECTED Final   Rhinovirus / Enterovirus NOT DETECTED NOT DETECTED Final   Influenza A NOT DETECTED NOT DETECTED Final   Influenza B NOT DETECTED NOT DETECTED Final   Parainfluenza Virus 1 NOT DETECTED NOT DETECTED Final   Parainfluenza Virus 2 NOT DETECTED NOT DETECTED Final   Parainfluenza Virus 3 NOT DETECTED NOT DETECTED Final   Parainfluenza Virus 4 NOT DETECTED NOT DETECTED Final   Respiratory Syncytial Virus NOT DETECTED NOT DETECTED Final   Bordetella pertussis NOT DETECTED NOT DETECTED Final   Chlamydophila pneumoniae NOT DETECTED NOT DETECTED Final   Mycoplasma pneumoniae NOT DETECTED NOT DETECTED Final    Comment: Performed at [facility], [place] [address], [place], [place] [address]  MRSA PCR Screening     Status: None   Collection Time: 05/28/17  9:40 AM  Result Value Ref Range Status   MRSA by PCR NEGATIVE NEGATIVE Final    Comment:        The GeneXpert MRSA Assay (FDA approved for NASAL specimens only), is one component of a comprehensive MRSA colonization surveillance program. It is not intended to diagnose MRSA infection nor to guide or monitor treatment for MRSA infections. Performed at [facility], [place] [address] [place], [place] [address]          Radiology Studies: Dg Chest Port 1 View  Result Date: 05/27/2017 CLINICAL DATA:  Chest pain and worsening shortness of breath. EXAM: PORTABLE CHEST 1 VIEW COMPARISON:  Chest x-ray dated May 23, 2017.  FINDINGS: Stable cardiomegaly. Persistent low lung volumes and small left pleural effusion. Apparent new hazy density in the right mid lung is likely secondary to overlapping scapula. Unchanged patchy density at the left lung base. No pneumothorax. No acute osseous abnormality. Old right-sided rib fractures. IMPRESSION: Unchanged left lower lobe atelectasis versus infiltrate and small left pleural effusion. Electronically Signed   By: Titus Dubin M.D.   On: 05/27/2017 11:23        Scheduled Meds: . apixaban  5 mg Oral BID  . calcium carbonate  1,250 mg Oral BID WC  . collagenase   Topical Daily  . diltiazem  300 mg Oral Daily  . divalproex  250 mg Oral Daily   And  . divalproex  750 mg Oral QHS  . doxazosin  4 mg Oral Daily  . ezetimibe  5 mg Oral QHS  . feeding supplement (ENSURE ENLIVE)  237 mL Oral TID BM  . feeding supplement (PRO-STAT SUGAR FREE 64)  30 mL Oral BID  . ferrous sulfate  325 mg Oral BID WC  . furosemide  40 mg Oral Q3 days  . gabapentin  300 mg Oral BID  . ipratropium-albuterol  3 mL Nebulization TID  .  levothyroxine  150 mcg Oral QAC breakfast  . metoprolol tartrate  25 mg Oral BID  .  morphine injection  1 mg Intravenous Once  . pantoprazole (PROTONIX) IV  40 mg Intravenous Q12H  . pravastatin  40 mg Oral QHS  . predniSONE  10 mg Oral Q breakfast  . zinc sulfate  220 mg Oral q morning - 10a   Continuous Infusions: . ceFEPime (MAXIPIME) IV Stopped (05/28/17 0851)  . vancomycin Stopped (05/27/17 2130)     LOS: 4 days    Time spent: 35 minutes.     Elmarie Shiley, MD [facility] Pager [phone]  If 7PM-7AM, please contact night-coverage [email or web address] Password [facility] 05/28/2017, 12:41 PM

## 2017-05-28 NOTE — Evaluation (Signed)
Physical Therapy Evaluation Patient Details Name: [name] MRN: [id number] DOB: September 26, 1933 Today's Date: 05/28/2017   History of Present Illness  84yo male brought to the ED with confusion, found to be in A-fib with RVR by EMS. DIagnosed with sepsis, new A-fib with RVR, acute encephalopathy. PMH chronic R LE wound, HTN, CKD, seizures, memory loss, hypothyroidism, OA, skin CA, hx vertigo, hx back surgery, hx cardiac cath, knee arthoplasty, laminectomy, posterior cervical fusion   Clinical Impression  Pt was at rehab PTA with plan to d/c home tomorrow however pt now admitted with above. Pt now requires maxA and is unable to ambulate at this time. Pt with severely impaired processing limiting ability to follow commands and transfer to chair. Acute PT to con't to follow.    Follow Up Recommendations SNF(return to [facility])    Equipment Recommendations  None recommended by PT    Recommendations for Other Services       Precautions / Restrictions Precautions Precautions: Fall Restrictions Weight Bearing Restrictions: No      Mobility  Bed Mobility Overal bed mobility: Needs Assistance Bed Mobility: Supine to Sit;Sit to Supine     Supine to sit: Max assist Sit to supine: Mod assist   General bed mobility comments: pt initiated LE movement off EOB, but required maxA for trunk elevation and to bring LEs off eob, pt able to scoot to EOB using hands to push up  Transfers Overall transfer level: Needs assistance               General transfer comment: attempted to stand and a lateral scoot transfer to drop arm chair however pt with poor processing and unable to follow commands to complete. attempted to assist pt with bed pad however unable to advance pt despite maximal verbal and tactile cues and several attempts  Ambulation/Gait             General Gait Details: unable this date  Stairs            Wheelchair Mobility    Modified Rankin (Stroke Patients  Only)       Balance Overall balance assessment: Needs assistance Sitting-balance support: Feet supported;No upper extremity supported Sitting balance-Leahy Scale: Fair     Standing balance support: (unable to stand at this time)                                 Pertinent Vitals/Pain Pain Assessment: No/denies pain    Home Living Family/patient expects to be discharged to:: Skilled nursing facility Living Arrangements: Spouse/significant other               Additional Comments: was at [facility] PTA    Prior Function Level of Independence: Needs assistance   Gait / Transfers Assistance Needed: was walking with RW  ADL's / Homemaking Assistance Needed: required min/modA for dressing and bathing  Comments: just went home with rehab OT and PT last friday for trial run prior to planned d/c for 3/20     Hand Dominance   Dominant Hand: Right    Extremity/Trunk Assessment   Upper Extremity Assessment Upper Extremity Assessment: Generalized weakness    Lower Extremity Assessment Lower Extremity Assessment: Generalized weakness(R with baseline drop foot, grossly 3-/5)    Cervical / Trunk Assessment Cervical / Trunk Assessment: Normal  Communication   Communication: No difficulties  Cognition Arousal/Alertness: Awake/alert Behavior During Therapy: Flat affect Overall Cognitive Status: Impaired/Different from baseline(has  dementia at baseline) Area of Impairment: Memory;Following commands;Safety/judgement;Awareness;Problem solving;Orientation                 Orientation Level: Disoriented to;Time;Place   Memory: Decreased short-term memory Following Commands: Follows one step commands with increased time Safety/Judgement: Decreased awareness of safety;Decreased awareness of deficits Awareness: Intellectual Problem Solving: Slow processing;Decreased initiation;Difficulty sequencing;Requires verbal cues;Requires tactile cues General Comments:  pt with severly impaired processing and sequencing requiring max directional verbal and tactile cues to complete      General Comments General comments (skin integrity, edema, etc.): pt with noted dressing on R lower leg    Exercises     Assessment/Plan    PT Assessment Patient needs continued PT services  PT Problem List Decreased strength;Decreased balance;Decreased activity tolerance;Decreased range of motion;Decreased mobility;Decreased coordination;Decreased cognition;Decreased knowledge of use of DME;Decreased safety awareness       PT Treatment Interventions DME instruction;Gait training;Stair training;Functional mobility training;Therapeutic activities;Therapeutic exercise;Balance training    PT Goals (Current goals can be found in the Care Plan section)  Acute Rehab PT Goals Patient Stated Goal: didn't state PT Goal Formulation: With patient/family Time For Goal Achievement: 06/11/17 Potential to Achieve Goals: Good    Frequency Min 3X/week   Barriers to discharge        Co-evaluation               AM-PAC PT "6 Clicks" Daily Activity  Outcome Measure Difficulty turning over in bed (including adjusting bedclothes, sheets and blankets)?: Unable Difficulty moving from lying on back to sitting on the side of the bed? : Unable Difficulty sitting down on and standing up from a chair with arms (e.g., wheelchair, bedside commode, etc,.)?: Unable Help needed moving to and from a bed to chair (including a wheelchair)?: Total Help needed walking in hospital room?: Total Help needed climbing 3-5 steps with a railing? : Total 6 Click Score: 6    End of Session Equipment Utilized During Treatment: Oxygen Activity Tolerance: Patient tolerated treatment well Patient left: in bed;with call bell/phone within reach;with bed alarm set;with family/visitor present Nurse Communication: Mobility status PT Visit Diagnosis: Unsteadiness on feet (R26.81);Muscle weakness  (generalized) (M62.81)    Time: 6759-1638 PT Time Calculation (min) (ACUTE ONLY): 34 min   Charges:   PT Evaluation $PT Eval Moderate Complexity: 1 Mod PT Treatments $Therapeutic Activity: 8-22 mins   PT G Codes:        Kittie Plater, PT, DPT Pager #: [phone] Office #: [phone]   [place] 05/28/2017, 2:44 PM

## 2017-05-29 DIAGNOSIS — R0600 Dyspnea, unspecified: Secondary | ICD-10-CM

## 2017-05-29 DIAGNOSIS — J181 Lobar pneumonia, unspecified organism: Secondary | ICD-10-CM

## 2017-05-29 DIAGNOSIS — Z7189 Other specified counseling: Secondary | ICD-10-CM

## 2017-05-29 DIAGNOSIS — Z515 Encounter for palliative care: Secondary | ICD-10-CM

## 2017-05-29 DIAGNOSIS — I4891 Unspecified atrial fibrillation: Secondary | ICD-10-CM

## 2017-05-29 LAB — CULTURE, BLOOD (ROUTINE X 2)
Culture: NO GROWTH
Culture: NO GROWTH
Special Requests: ADEQUATE
Special Requests: ADEQUATE

## 2017-05-29 LAB — BASIC METABOLIC PANEL
Anion gap: 10 (ref 5–15)
BUN: 38 mg/dL — ABNORMAL HIGH (ref 6–20)
CO2: 25 mmol/L (ref 22–32)
Calcium: 7.9 mg/dL — ABNORMAL LOW (ref 8.9–10.3)
Chloride: 107 mmol/L (ref 101–111)
Creatinine, Ser: 1.27 mg/dL — ABNORMAL HIGH (ref 0.61–1.24)
GFR calc Af Amer: 59 mL/min — ABNORMAL LOW (ref >60)
GFR calc non Af Amer: 51 mL/min — ABNORMAL LOW (ref >60)
Glucose, Bld: 87 mg/dL (ref 65–99)
Potassium: 3.9 mmol/L (ref 3.5–5.1)
Sodium: 142 mmol/L (ref 135–145)

## 2017-05-29 LAB — CBC
HCT: 29 % — ABNORMAL LOW (ref 39.0–52.0)
Hemoglobin: 8.7 g/dL — ABNORMAL LOW (ref 13.0–17.0)
MCH: 30.1 pg (ref 26.0–34.0)
MCHC: 30 g/dL (ref 30.0–36.0)
MCV: 100.3 fL — ABNORMAL HIGH (ref 78.0–100.0)
Platelets: 153 10*3/uL (ref 150–400)
RBC: 2.89 MIL/uL — ABNORMAL LOW (ref 4.22–5.81)
RDW: 14.6 % (ref 11.5–15.5)
WBC: 8.3 10*3/uL (ref 4.0–10.5)

## 2017-05-29 MED ORDER — ORAL CARE MOUTH RINSE
15.0000 mL | Freq: Two times a day (BID) | OROMUCOSAL | Status: DC
  Administered 2017-05-29 – 2017-06-01 (×7): 15 mL via OROMUCOSAL

## 2017-05-29 MED ORDER — FUROSEMIDE 20 MG PO TABS
20.0000 mg | ORAL_TABLET | ORAL | Status: DC
  Administered 2017-05-31: 20 mg via ORAL
  Filled 2017-05-29: qty 1

## 2017-05-29 NOTE — Progress Notes (Signed)
CSW received a consult for SNF, however, per palliative patient's wife choosing home with home health. Also, patient recently discharged from [facility] and would be in copay days with insurance.   CSW signing off at this time.  Percell Locus Adelle Zachar LCSW [phone]

## 2017-05-29 NOTE — Consult Note (Signed)
Consultation Note Date: 05/29/2017   Patient Name: [name]  DOB: 04-Jul-1933  MRN: [id number]  Age / Sex: 84 y.o., male  PCP: [name], MD Referring Physician: Cristal Ford, DO  Reason for Consultation: Establishing goals of care  HPI/Patient Profile: 84 y.o. male  with past medical history of HTN, HLD,  afib on chronic anticoagulation, seizures, hypothyroidism, dementia, chronic back pain, chronic lower extremity wounds, GERD, CKD 3, and CHF admitted on 05/23/2017 with fever and AMS. Found to be septic: lower extremity wound infection vs pna. Course has been complicated by acute dyspnea, chest pain, increasing drainage from leg wounds, diarrhea, poor oral intake, and acute metabolic encephalopathy. Of note, pt is refusing MRI for leg would evaluation. Patient had recent hospitalization 2/6-2/14 for same. He was d/c'd to rehab.  PMT consulted for [place].   Clinical Assessment and Goals of Care: I have reviewed medical records including EPIC notes, labs and imaging, assessed the patient and then met at the bedside along with patient's wife, [name],  to discuss diagnosis prognosis, [place], EOL wishes, disposition and options.  Patient unable to participate in [place] conversation d/t AMS.  I introduced Palliative Medicine as specialized medical care for people living with serious illness. It focuses on providing relief from the symptoms and stress of a serious illness. The goal is to improve quality of life for both the patient and the family.  We discussed a brief life review of the patient. 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 His wife is a retired Pharmacist, hospital. They have been married for over 20 years. The patient has 3 sons and multiple grandchildren. Having time with his grandchildren is very important to him. Their 3 sons live out of town. The patient and wife have a strong Christian faith and their faith  community is a huge source of support.    As far as functional and nutritional status, [name] tells me she has seen a decline in both areas. He uses a walker and has become progressively weaker over the past several months. She has to help him dress, but he is able to bathe and go to the bathroom on his own. He has experienced some falls at home. His appetite has declined over the past several months. His cognitive status fluctuates and has done so for several months. She tells me throughout the day he has periods he is completely lucid and then times he is obviously confused. She tells me his long term memory is intact, but not short term.     We discussed his current illness and what it means in the larger context of his on-going co-morbidities.  Natural disease trajectory and expectations at EOL were discussed. I explained to her that declines in functional, cognitive, and nutritional status likely mean patient is nearing the end of life.   I attempted to elicit values and goals of care important to the patient.  She tells me that time is important to him, specifically time with his family.   The difference between aggressive medical intervention and comfort care was considered in light of the patient's goals of care.   Advanced directives, concepts specific to code status, artifical feeding and hydration, and rehospitalization were considered and discussed. The patient has a living will but the wife did not have these documents today. She would like me to return tomorrow to discuss the patients living will.   Hospice and Palliative Care services outpatient were explained and offered. The  patient's wife is interested in hospice in the home as she would like for her husband to pass in the home, not in a medical facility, but she does not feel that he is ready for hospice support yet.   Throughout our conversation, she continued to reiterate "we are not giving up" and "it is not God's time". She in  interested is pursuing aggressive care at this time. She is also interested in the patient not returning to [facility] and instead returning home with home health. We discussed the amount of care the patient will likely need and she tells me she is financially able to provide 24/7 care for the patient. She would like to speak to case manager about different options. She feels like the patient has "given up" because he does not want to return to [facility] but she thinks he may do better if he can be offered the option to go home.   We discussed code status and I explained to her that DNR does not mean do not treat and that we could continue aggressive treatment if the patient is a DNR. She would like to discuss this topic further when we look at his living will tomorrow - for now wants him to remain full code.   She tells me her biggest fear is that she will not be able to work it out for the patient to return home.  Questions and concerns were addressed.  Hard Choices booklet left for review. The family was encouraged to call with questions or concerns.   Primary Decision Maker NEXT OF KIN - wife - [name]    SUMMARY OF RECOMMENDATIONS   - full scope treatment -PMT will meet with wife again tomorrow when she brings living will documents to discuss care going forward and code status - very interested in home health - asking for case manager - consult placed  Code Status/Advance Care Planning:  Full code - ongoing conversation - will look at living will tomorrow   Symptom Management:   None - denies pain today, denies shortness of breath  Palliative Prophylaxis:   Asp[place], Frequent Pain Assessment and Oral Care  Additional Recommendations (Limitations, Scope, Preferences):  Full Scope Treatment  Psycho-social/Spiritual:   Desire for further Chaplaincy support:not addressed  Additional Recommendations: Caregiving  Support/Resources and Education on Hospice  Prognosis:    Unable to determine  Discharge Planning: Home with Home Health      Primary Diagnoses: Present on Admission: . Sepsis ([place]) . HCAP (healthcare-associated pneumonia) . Atrial fibrillation with RVR ([place]) . Acute metabolic encephalopathy . Hypothyroidism   I have reviewed the medical record, interviewed the patient and family, and examined the patient. The following aspects are pertinent.  Past Medical History:  Diagnosis Date  . Abnormality of gait 09/22/2015  . Arthritis   . Atrial fibrillation ([place])   . CAD (coronary artery disease)    Stent to RCA, Penta stent, 99% reduced to 0% 2002.  . Cancer ([place])    skin CA removed from back  . Chronic insomnia 03/30/2015  . Complication of anesthesia    trouble waking up  . Constipation    from medications taken  . GERD (gastroesophageal reflux disease)   . Hypercholesteremia   . Hypertension   . Hypothyroidism   . Memory difficulty 09/22/2015  . Osteoarthritis   . Seizures ([place])   . Sepsis ([place]) 05/2017  . Transient alteration of awareness 03/30/2015  . Vertigo    hx of   Social History  Socioeconomic History  . Marital status: Married    Spouse name: [name]  . Number of children: 3  . Years of education: 51  . Highest education level: None  Social Needs  . Financial resource strain: None  . Food insecurity - worry: None  . Food insecurity - inability: None  . Transportation needs - medical: None  . Transportation needs - non-medical: None  Occupational History  . Occupation: retired Software engineer  Tobacco Use  . Smoking status: Former Research scientist (life sciences)  . Smokeless tobacco: Never Used  Substance and Sexual Activity  . Alcohol use: No    Comment: rare  . Drug use: No  . Sexual activity: None  Other Topics Concern  . None  Social History Narrative   Lives at home w/ his wife [name]   Patient drinks 4-5 cups of coffee daily.   Patient is right handed.    Family History  Problem Relation Age of Onset  .  Hypertension Mother   . Cancer Mother   . Kidney failure Father   . Heart disease Father    Scheduled Meds: . apixaban  5 mg Oral BID  . calcium carbonate  1,250 mg Oral BID WC  . collagenase   Topical Daily  . diltiazem  300 mg Oral Daily  . divalproex  250 mg Oral Daily   And  . divalproex  750 mg Oral QHS  . doxazosin  4 mg Oral Daily  . ezetimibe  5 mg Oral QHS  . feeding supplement (ENSURE ENLIVE)  237 mL Oral TID BM  . feeding supplement (PRO-STAT SUGAR FREE 64)  30 mL Oral BID  . ferrous sulfate  325 mg Oral BID WC  . [START ON 05/31/2017] furosemide  20 mg Oral Q3 days  . gabapentin  300 mg Oral BID  . ipratropium-albuterol  3 mL Nebulization TID  . levothyroxine  150 mcg Oral QAC breakfast  . mouth rinse  15 mL Mouth Rinse BID  . metoprolol tartrate  25 mg Oral BID  .  morphine injection  1 mg Intravenous Once  . pantoprazole (PROTONIX) IV  40 mg Intravenous Q12H  . pravastatin  40 mg Oral QHS  . predniSONE  10 mg Oral Q breakfast  . zinc sulfate  220 mg Oral q morning - 10a   Continuous Infusions: . ceFEPime (MAXIPIME) IV Stopped (05/29/17 0840)  . vancomycin     PRN Meds:.acetaminophen **OR** acetaminophen, HYDROcodone-acetaminophen, lidocaine, nitroGLYCERIN, ondansetron **OR** ondansetron (ZOFRAN) IV Allergies  Allergen Reactions  . Demerol [Meperidine] Nausea And Vomiting  . Keppra [Levetiracetam] Other (See Comments)    Causes sleepiness   Review of Systems  Unable to perform ROS: Mental status change  Constitutional:       Denies pain    Physical Exam  Constitutional: He appears well-developed and well-nourished. He appears lethargic. No distress.  HENT:  Head: Normocephalic and atraumatic.  Pulmonary/Chest: Effort normal and breath sounds normal. No respiratory distress.  Abdominal: Soft. Bowel sounds are normal. There is no tenderness.  Neurological: He appears lethargic.  Disoriented to time and situation, weak  Skin: Skin is warm and dry. He is  not diaphoretic.  Psychiatric: His speech is delayed. He is slowed. Cognition and memory are impaired. He is inattentive.    Vital Signs: BP 126/76   Pulse 85   Temp 97.9 F (36.6 C)   Resp (!) 26   Ht '5\' 7"'$  (1.702 m)   Wt 81.8 kg (180 lb 5.4 oz)  SpO2 96%   BMI 28.24 kg/m  Pain Assessment: No/denies pain POSS *See Group Information*: S-Acceptable,Sleep, easy to arouse Pain Score: Asleep   SpO2: SpO2: 96 % O2 Device:SpO2: 96 % O2 Flow Rate: .O2 Flow Rate (L/min): 2 L/min  IO: Intake/output summary:   Intake/Output Summary (Last 24 hours) at 05/29/2017 1034 Last data filed at 05/29/2017 0900 Gross per 24 hour  Intake 1040 ml  Output 1600 ml  Net -560 ml    LBM: Last BM Date: 05/27/17 Baseline Weight: Weight: 76.7 kg (169 lb 3.2 oz) Most recent weight: Weight: 81.8 kg (180 lb 5.4 oz)     Palliative Assessment/Data: PPS 40%     Time In: 11:00 Time Out: 13:30 Time Total: 150 minutes Greater than 50%  of this time was spent counseling and coordinating care related to the above assessment and plan.  Juel Burrow, DNP, AGNP-C Palliative Medicine Team [phone]

## 2017-05-29 NOTE — Care Management Note (Addendum)
Case Management Note  Patient Details  Name: [name] MRN: [id number] Date of Birth: 15-Nov-1933  Subjective/Objective:     Admitted with AMS/acute encephalopathy/sepsis/Afib with RVR. Hx of HTN, HLD, diastolic CHF,atrial fibrillation on chronic anticoagulation,seizures,hypothyroidism, dementia, chronic lower extremity wound, andGERD. [facility]         SRICHARAN LACOMB (Spouse)     [phone]      05/29/2017 @14 :30 pm  [facility] 1st program shared with pt/wife per NCM and information provided. Pt/wife stated interest in program. Referral made with [facility] 1st program.    PCP: DanielPaterson  Action/Plan: SNF vs HH.(potential home 1ST referral). NCM called to speak with wife regarding discharge planning however call unsuccessful, no answer. Pt will be in his copay days with SNF, $169.00/DAY.  Palliative consult pending..... NCM to f/u with transitional care needs  Expected Discharge Date:                  Expected Discharge Plan:  [place]  In-House Referral:  Clinical Social Work  Discharge planning Services  CM Consult  Post Acute Care Choice:    Choice offered to:     DME Arranged:    DME Agency:     HH Arranged:    [place] Agency:     Status of Service:  In process, will continue to follow  If discussed at Long Length of Stay Meetings, dates discussed:    Additional Comments:  Sharin Mons, RN 05/29/2017, 1:05 PM

## 2017-05-29 NOTE — Progress Notes (Signed)
Physical Therapy Treatment Patient Details Name: [name] MRN: [id number] DOB: 03-15-1933 Today's Date: 05/29/2017    History of Present Illness 84yo male brought to the ED with confusion, found to be in A-fib with RVR by EMS. DIagnosed with sepsis, new A-fib with RVR, acute encephalopathy. PMH chronic R LE wound, HTN, CKD, seizures, memory loss, hypothyroidism, OA, skin CA, hx vertigo, hx back surgery, hx cardiac cath, knee arthoplasty, laminectomy, posterior cervical fusion     PT Comments    Pt is showing progress towards goals, as he required less assist with mobilities this session. However, pt currently limited by confusion and difficulty processing. Pt required max verbal and tactile cueing to sit in recliner chair, however due to pt confusion, he pivoted back to bed to sit. Will continue to follow acutely to progress OOB activity and maximize pt safety with mobility.    Follow Up Recommendations  SNF(return to [facility])     Equipment Recommendations  None recommended by PT    Recommendations for Other Services       Precautions / Restrictions Precautions Precautions: Fall Restrictions Weight Bearing Restrictions: No    Mobility  Bed Mobility Overal bed mobility: Needs Assistance Bed Mobility: Supine to Sit;Sit to Supine     Supine to sit: Mod assist;+2 for physical assistance Sit to supine: Min guard   General bed mobility comments: Assist required to progress LE off EOB and to elevate trunk. Pt able to return to supine w/o physical assist.  Transfers Overall transfer level: Needs assistance Equipment used: Rolling walker (2 wheeled) Transfers: Sit to/from [facility] Sit to Stand: Min assist;+2 physical assistance Stand pivot transfers: Min assist;+2 safety/equipment       General transfer comment: Pt able to stand with min A +2 for safety. Requied multimodal cues to place second hand on RW once standing. Cues for postural control. Min  A to pivot to recliner chair. Pt remained standing for pericare. Pt cued to sit, however instead of sitting in chair, pt pivoted and sat on recliner arm. Again pt cued to stand and sit in recliner chair, however pt stood and sat in bed.  Ambulation/Gait             General Gait Details: unable at this time   Stairs            Wheelchair Mobility    Modified Rankin (Stroke Patients Only)       Balance Overall balance assessment: Needs assistance Sitting-balance support: Feet supported;No upper extremity supported Sitting balance-Leahy Scale: Fair   Postural control: Posterior lean Standing balance support: Bilateral upper extremity supported Standing balance-Leahy Scale: Poor Standing balance comment: min A and RW required to stand                            Cognition Arousal/Alertness: Awake/alert Behavior During Therapy: Flat affect Overall Cognitive Status: Impaired/Different from baseline(has dementia at baseline) Area of Impairment: Memory;Following commands;Safety/judgement;Awareness;Problem solving;Orientation                 Orientation Level: Disoriented to;Time;Situation   Memory: Decreased short-term memory Following Commands: Follows one step commands with increased time;Follows one step commands inconsistently Safety/Judgement: Decreased awareness of safety;Decreased awareness of deficits Awareness: Intellectual Problem Solving: Slow processing;Decreased initiation;Difficulty sequencing;Requires verbal cues;Requires tactile cues General Comments: Pt seemed confused and has dificulty processing commands. Even with max directional and VC pt unable to complete task      Exercises  General Comments        Pertinent Vitals/Pain Pain Assessment: No/denies pain    Home Living                      Prior Function            PT Goals (current goals can now be found in the care plan section) Acute Rehab PT  Goals Patient Stated Goal: didn't state PT Goal Formulation: With patient/family Time For Goal Achievement: 06/11/17 Potential to Achieve Goals: Good Progress towards PT goals: Progressing toward goals    Frequency    Min 3X/week      PT Plan Current plan remains appropriate    Co-evaluation              AM-PAC PT "6 Clicks" Daily Activity  Outcome Measure  Difficulty turning over in bed (including adjusting bedclothes, sheets and blankets)?: Unable Difficulty moving from lying on back to sitting on the side of the bed? : Unable Difficulty sitting down on and standing up from a chair with arms (e.g., wheelchair, bedside commode, etc,.)?: A Little Help needed moving to and from a bed to chair (including a wheelchair)?: A Little Help needed walking in hospital room?: Total Help needed climbing 3-5 steps with a railing? : Total 6 Click Score: 10    End of Session Equipment Utilized During Treatment: Gait belt Activity Tolerance: Other (comment)(limited by confusion) Patient left: in bed;with call bell/phone within reach;with bed alarm set Nurse Communication: Mobility status PT Visit Diagnosis: Unsteadiness on feet (R26.81);Muscle weakness (generalized) (M62.81)     Time: 0935-1000 PT Time Calculation (min) (ACUTE ONLY): 25 min  Charges:  $Therapeutic Activity: 23-37 mins                    G Codes:      [name], [place] Pager [phone] Acute Rehab  [name] 05/29/2017, 10:11 AM

## 2017-05-29 NOTE — Progress Notes (Signed)
PROGRESS NOTE    [name]  IZT:[id number] DOB: 1933-07-26 DOA: 05/23/2017 PCP: [name], MD   Brief Narrative:  HPI on 05/24/2017 by Dr. Fuller Plan [name] is a 84 y.o. male with medical history significant of HTN, HLD, diastolic CHF, atrial fibrillation on chronic anticoagulation, seizures, hypothyroidism, dementia, chronic lower extremity wound, and GERD; who presents from rehab after being found to have fever with altered mental status.  History is obtained from the patient and wife and review of records as he is unable to give his own due to being acutely altered.   He had recently been hospitalized from 2/6-2/14 for sepsis secondary to pneumonia and/or chronic leg wounds treated with Zithromax and Rocephin.  At discharge patient was sent to rehab, and had per his wife been improving walking with a rolling walker with planned discharge home sometime next week.  However, around lunchtime patient was noted to be not responding like normally with a temperature of up to 101 F.  He was given Tylenol prior to EMS arrival.  She notes that he has had a mild intermittent cough.  Denies any recent seizure activity to her knowledge.  Reports that while he was at the facility though that they have been giving him more difficult than he normally takes at home.  Interim history Admitted for sepsis secondary to pneumonia versus wound infection.  Also found to have acute hypoxic respiratory failure.  Patient being seen by cardiology for elevated troponin.  Palliative care also consulted for goals of care conversation.  Assessment & Plan   Sepsis secondary to pneumonia versus wound infection -Upon admission, patient was febrile with tachycardia, tachypnea -Chest x-ray reviewed and showed infiltrates on the left -was started on vancomycin and cefepime -Blood culture showed no growth to date -Patient does have chronic lower extremity wounds.  MRI ordered however patient refused on  05/28/2017.  Acute hypoxic respiratory failure -Patient was given 1 dose of Solu-Medrol -Suspect secondary to pneumonia -Chest x-ray as above  -Continue nebulizer treatments -Continue supplemental oxygen to maintain oxygen saturations above 90%  Chest pain/mildly elevated troponin -Currently denies chest pain at this time -EKG showed no significant changes -Troponin relatively flat (0.06-0.08) -Cardiology consulted and appreciated, recommended no further workup  Acute kidney injury -Creatinine slightly up today 1.27 -was 1.41 on admission however had improved to baseline of 1 -Cardiology adjusted lasix dose -continue to monitor BMP  Chronic diastolic heart failure -Echocardiogram 04/18/2017 showed EF of 60-65% -Currently does not appear to be volume overloaded -As above, Lasix dose changed due to acute kidney injury -Sater intake and output, daily weights  Iron deficiency anemia -Continue iron supplementation -Hemoglobin 8.7 (baseline 9-10) -Continue to monitor CBC  Chronic lower extremity wounds -Patient had ABI done recently which was unremarkable -Pending MRI, see discussion above  Persistent atrial fibrillation -Cardiology consulted and appreciated-recommended continuing current medications -Continue Cardizem, Eliquis  Acute metabolic encephalopathy -Appears to be resolved at this time- wife at bedside states patient is not confused -Possibly related to infection versus medications  Essential hypertension -Continue Lasix, Cardura, diltiazem, metoprolol  Metabolic acidosis -Sodium bicarb held -Resolved  Diarrhea -Laxatives held -Continue to monitor  Dyslipidemia -Continue statin, Zetia  Hypokalemia -Resolved, continue to monitor BMP  BPH -Continue Cardura  Hypothyroidism -Continue Synthroid  GERD -Continue PPI  Seizure disorder -Continue Depakote  Goals of care -Palliative care consulted and appreciated to discuss [place] with patient's  wife  Physical deconditioning -PT consulted, recommended SNF -Discussed with social work, patient  may not have SNF days left.  He was recently at [facility].  DVT Prophylaxis  Eliquis  Code Status: Full  Family Communication: Wife at bedside  Disposition Plan: Admitted. Dispo pending (he was at [facility]).   Consultants Cardiology Palliative care  Procedures  None  Antibiotics   Anti-infectives (From admission, onward)   Start     Dose/Rate Route Frequency Ordered Stop   05/29/17 2000  vancomycin (VANCOCIN) IVPB 1000 mg/200 mL premix     1,000 mg 200 mL/hr over 60 Minutes Intravenous Every 24 hours 05/28/17 2113     05/26/17 2200  ceFEPIme (MAXIPIME) 2 g in sodium chloride 0.9 % 100 mL IVPB  Status:  Discontinued     2 g 200 mL/hr over 30 Minutes Intravenous Every 12 hours 05/26/17 1230 05/26/17 1231   05/26/17 1245  ceFEPIme (MAXIPIME) 2 g in sodium chloride 0.9 % 100 mL IVPB     2 g 200 mL/hr over 30 Minutes Intravenous Every 12 hours 05/26/17 1230 06/01/17 0959   05/24/17 2200  ceFEPIme (MAXIPIME) 2 g in sodium chloride 0.9 % 100 mL IVPB  Status:  Discontinued     2 g 200 mL/hr over 30 Minutes Intravenous Every 24 hours 05/24/17 0348 05/26/17 1230   05/24/17 2000  vancomycin (VANCOCIN) 1,250 mg in sodium chloride 0.9 % 250 mL IVPB  Status:  Discontinued     1,250 mg 166.7 mL/hr over 90 Minutes Intravenous Every 24 hours 05/24/17 0405 05/28/17 2113   05/24/17 0030  ceFEPIme (MAXIPIME) 2 g in sodium chloride 0.9 % 100 mL IVPB     2 g 200 mL/hr over 30 Minutes Intravenous  Once 05/24/17 0015 05/24/17 0135   05/24/17 0030  vancomycin (VANCOCIN) IVPB 1000 mg/200 mL premix     1,000 mg 200 mL/hr over 60 Minutes Intravenous  Once 05/24/17 0027 05/24/17 0205      Subjective:   [name] seen and examined today.  Has no complaints today. Feels very tired and weak. Does not feel he is ready to be discharged within the next few days. Denies chest pain,  shortness of breath, abdominal pain, N/V.   Objective:   Vitals:   05/29/17 0810 05/29/17 0815 05/29/17 1157 05/29/17 1308  BP: 126/76   120/74  Pulse: 85   89  Resp: 16   14  Temp:   97.8 F (36.6 C) 97.7 F (36.5 C)  TempSrc:   Oral Oral  SpO2:  96%    Weight:      Height:        Intake/Output Summary (Last 24 hours) at 05/29/2017 1316 Last data filed at 05/29/2017 0900 Gross per 24 hour  Intake 1040 ml  Output 1600 ml  Net -560 ml   Filed Weights   05/27/17 1710 05/28/17 0457 05/29/17 0413  Weight: 80.4 kg (177 lb 4 oz) 84.5 kg (186 lb 4.6 oz) 81.8 kg (180 lb 5.4 oz)    Exam  General: Well developed, chronically ill appearing, NAD  HEENT: NCAT,mucous membranes moist.   Neck: Supple  Cardiovascular: S1 S2 auscultated, irregular   Respiratory: Clear to auscultation bilaterally with equal chest rise  Abdomen: Soft, nontender, nondistended, + bowel sounds  Extremities: warm dry without cyanosis clubbing or edema. RLE with dressing in place  Neuro: AAOx1 (self, wife), nonfocal  Psych: Appropriate mood and affect   Data Reviewed: I have personally reviewed following labs and imaging studies  CBC: Recent Labs  Lab 05/23/17 2143 05/25/17 0527 05/26/17  8250 05/26/17 1344 05/27/17 0824 05/28/17 1008 05/29/17 0639  WBC 11.0* 7.7 8.0  --  8.3 8.2 8.3  NEUTROABS 8.7* 5.9  --   --   --   --   --   HGB 9.3* 8.1* 8.1* 8.2* 8.8* 8.9* 8.7*  HCT 29.9* 26.7* 26.6* 26.6* 28.3* 29.0* 29.0*  MCV 100.7* 101.5* 102.3*  --  101.8* 99.7 100.3*  PLT 158 125* 126*  --  136* 148* 539   Basic Metabolic Panel: Recent Labs  Lab 05/26/17 0809 05/26/17 0948 05/26/17 1317 05/27/17 0824 05/28/17 1008 05/28/17 1455 05/29/17 0639  NA 141  --  142 142 142  --  142  K 4.7  --  4.5 3.6 4.0  --  3.9  CL 116*  --  115* 112* 111  --  107  CO2 17*  --  21* 22 21*  --  25  GLUCOSE 84  --  104* 90 135*  --  87  BUN 29*  --  28* 23* 29*  --  38*  CREATININE 1.05  --  1.02 1.00  1.08  --  1.27*  CALCIUM 7.4*  --  7.4* 7.6* 7.8*  --  7.9*  MG  --  1.9  --   --   --  1.9  --    GFR: Estimated Creatinine Clearance: 46.7 mL/min (A) (by C-G formula based on SCr of 1.27 mg/dL (H)). Liver Function Tests: Recent Labs  Lab 05/23/17 2143  AST 21  ALT 17  ALKPHOS 50  BILITOT 0.7  PROT 5.0*  ALBUMIN 2.9*   No results for input(s): LIPASE, AMYLASE in the last 168 hours. No results for input(s): AMMONIA in the last 168 hours. Coagulation Profile: Recent Labs  Lab 05/23/17 2143  INR 1.54   Cardiac Enzymes: Recent Labs  Lab 05/26/17 1344 05/26/17 1927 05/27/17 0225  TROPONINI 0.07* 0.06* 0.08*   BNP (last 3 results) No results for input(s): PROBNP in the last 8760 hours. HbA1C: No results for input(s): HGBA1C in the last 72 hours. CBG: Recent Labs  Lab 05/23/17 2140 05/26/17 1308 05/26/17 2057  GLUCAP 91 98 116*   Lipid Profile: No results for input(s): CHOL, HDL, LDLCALC, TRIG, CHOLHDL, LDLDIRECT in the last 72 hours. Thyroid Function Tests: No results for input(s): TSH, T4TOTAL, FREET4, T3FREE, THYROIDAB in the last 72 hours. Anemia Panel: No results for input(s): VITAMINB12, FOLATE, FERRITIN, TIBC, IRON, RETICCTPCT in the last 72 hours. Urine analysis:    Component Value Date/Time   COLORURINE STRAW (A) 05/24/2017 0014   APPEARANCEUR CLEAR 05/24/2017 0014   LABSPEC 1.009 05/24/2017 0014   PHURINE 5.0 05/24/2017 0014   GLUCOSEU NEGATIVE 05/24/2017 0014   HGBUR NEGATIVE 05/24/2017 0014   BILIRUBINUR NEGATIVE 05/24/2017 0014   KETONESUR NEGATIVE 05/24/2017 0014   PROTEINUR NEGATIVE 05/24/2017 0014   UROBILINOGEN 1.0 09/16/2007 1212   NITRITE NEGATIVE 05/24/2017 0014   LEUKOCYTESUR NEGATIVE 05/24/2017 0014   Sepsis Labs: @LABRCNTIP (procalcitonin:4,lacticidven:4)  ) Recent Results (from the past 240 hour(s))  Aerobic Culture (superficial specimen)     Status: None   Collection Time: 05/21/17 12:15 PM  Result Value Ref Range Status    Specimen Description   Final    TOE LEFT GREAT Performed at [facility], [place] [address], [place], [place] [address]    Special Requests   Final    NONE Performed at [facility], [place] [address] [place], [place] [address]    Gram Stain   Final  NO WBC SEEN FEW GRAM POSITIVE COCCI Performed at [facility], [place] [address], [place], [place] [address]    Culture   Final    MODERATE METHICILLIN RESISTANT STAPHYLOCOCCUS AUREUS   Report Status 05/24/2017 FINAL  Final   Organism ID, Bacteria METHICILLIN RESISTANT STAPHYLOCOCCUS AUREUS  Final      Susceptibility   Methicillin resistant staphylococcus aureus - MIC*    CIPROFLOXACIN >=8 RESISTANT Resistant     ERYTHROMYCIN >=8 RESISTANT Resistant     GENTAMICIN <=0.5 SENSITIVE Sensitive     OXACILLIN >=4 RESISTANT Resistant     TETRACYCLINE <=1 SENSITIVE Sensitive     VANCOMYCIN 1 SENSITIVE Sensitive     TRIMETH/SULFA <=10 SENSITIVE Sensitive     CLINDAMYCIN >=8 RESISTANT Resistant     RIFAMPIN <=0.5 SENSITIVE Sensitive     Inducible Clindamycin NEGATIVE Sensitive     * MODERATE METHICILLIN RESISTANT STAPHYLOCOCCUS AUREUS  Culture, blood (Routine x 2)     Status: None (Preliminary result)   Collection Time: 05/23/17  9:45 PM  Result Value Ref Range Status   Specimen Description BLOOD RIGHT FOREARM  Final   Special Requests   Final    BOTTLES DRAWN AEROBIC AND ANAEROBIC Blood Culture adequate volume   Culture   Final    NO GROWTH 4 DAYS Performed at [facility], [address] [place], [place] [address]    Report Status PENDING  Incomplete  Culture, blood (Routine x 2)     Status: None (Preliminary result)   Collection Time: 05/23/17  9:49 PM  Result Value Ref Range Status   Specimen Description BLOOD LEFT ARM  Final   Special Requests   Final    BOTTLES DRAWN AEROBIC AND ANAEROBIC Blood Culture adequate volume   Culture   Final    NO GROWTH 4 DAYS Performed at [facility], [address], [place], [place] [address]    Report Status PENDING  Incomplete  Urine Culture     Status: None   Collection Time: 05/24/17 12:14 AM  Result Value Ref Range Status   Specimen Description URINE, CATHETERIZED  Final   Special Requests NONE  Final   Culture   Final    NO GROWTH Performed at [facility], [place] [address] [place], [place] [address]    Report Status 05/25/2017 FINAL  Final  Respiratory Panel by PCR     Status: None   Collection Time: 05/24/17  4:56 AM  Result Value Ref Range Status   Adenovirus NOT DETECTED NOT DETECTED Final   Coronavirus 229E NOT DETECTED NOT DETECTED Final   Coronavirus HKU1 NOT DETECTED NOT DETECTED Final   Coronavirus NL63 NOT DETECTED NOT DETECTED Final   Coronavirus OC43 NOT DETECTED NOT DETECTED Final   Metapneumovirus NOT DETECTED NOT DETECTED Final   Rhinovirus / Enterovirus NOT DETECTED NOT DETECTED Final   Influenza A NOT DETECTED NOT DETECTED Final   Influenza B NOT DETECTED NOT DETECTED Final   Parainfluenza Virus 1 NOT DETECTED NOT DETECTED Final   Parainfluenza Virus 2 NOT DETECTED NOT DETECTED Final   Parainfluenza Virus 3 NOT DETECTED NOT DETECTED Final   Parainfluenza Virus 4 NOT DETECTED NOT DETECTED Final   Respiratory Syncytial Virus NOT DETECTED NOT DETECTED Final   Bordetella pertussis NOT DETECTED NOT DETECTED Final   Chlamydophila pneumoniae NOT DETECTED NOT DETECTED Final   Mycoplasma pneumoniae NOT DETECTED NOT DETECTED Final    Comment: Performed at [facility],  [address] [place], [place] [address]  MRSA PCR Screening     Status: None   Collection Time: 05/28/17  9:40 AM  Result Value Ref Range Status   MRSA by PCR NEGATIVE NEGATIVE Final    Comment:        The GeneXpert MRSA Assay (FDA approved for NASAL specimens only), is one component of a comprehensive MRSA colonization surveillance program. It is not intended to diagnose MRSA infection nor to guide or monitor  treatment for MRSA infections. Performed at [facility], [place] [address] [place], [place] [address]       Radiology Studies: No results found.   Scheduled Meds: . apixaban  5 mg Oral BID  . calcium carbonate  1,250 mg Oral BID WC  . collagenase   Topical Daily  . diltiazem  300 mg Oral Daily  . divalproex  250 mg Oral Daily   And  . divalproex  750 mg Oral QHS  . doxazosin  4 mg Oral Daily  . ezetimibe  5 mg Oral QHS  . feeding supplement (ENSURE ENLIVE)  237 mL Oral TID BM  . feeding supplement (PRO-STAT SUGAR FREE 64)  30 mL Oral BID  . ferrous sulfate  325 mg Oral BID WC  . [START ON 05/31/2017] furosemide  20 mg Oral Q3 days  . gabapentin  300 mg Oral BID  . ipratropium-albuterol  3 mL Nebulization TID  . levothyroxine  150 mcg Oral QAC breakfast  . mouth rinse  15 mL Mouth Rinse BID  . metoprolol tartrate  25 mg Oral BID  .  morphine injection  1 mg Intravenous Once  . pantoprazole (PROTONIX) IV  40 mg Intravenous Q12H  . pravastatin  40 mg Oral QHS  . predniSONE  10 mg Oral Q breakfast  . zinc sulfate  220 mg Oral q morning - 10a   Continuous Infusions: . ceFEPime (MAXIPIME) IV Stopped (05/29/17 0840)  . vancomycin       LOS: 5 days   Time Spent in minutes   30 minutes  Morrigan Wickens D.O. on 05/29/2017 at 1:16 PM  Between 7am to 7pm - Pager - [phone]  After 7pm go to [email or web address] - password [email or web address]  And look for the night coverage person covering for me after hours  [facility] Office  [phone]

## 2017-05-29 NOTE — Progress Notes (Signed)
Nutrition Follow-up  DOCUMENTATION CODES:   Not applicable  INTERVENTION:  Continue Ensure Enlive po TID, each supplement provides 350 kcal and 20 grams of protein  Continue Pro-Stat 68mL BID with meals, each supplement provides 100 calories and 15 grams of protein  NUTRITION DIAGNOSIS:   Increased nutrient needs related to chronic illness, wound healing as evidenced by estimated needs. -ongoing  GOAL:   Patient will meet greater than or equal to 90% of their needs -unmet  MONITOR:   PO intake, Supplement acceptance, Labs, Skin, Weight trends, I & O's  ASSESSMENT:   [name] with PMH significant of HTN, HLD, diastolic CHF, atrial fibrillation on chronic anticoagulation, seizures, hypothyroidism, dementia, chronic lower extremity wound, and GERD; who presents from rehab after being found to have fever with altered mental status.  Pt found to be septic secondary to pna vs wound infection. CXR shows Left lung base atelectasis versus infiltrate.  Patient cannot remember PO from today. Documented PO 50-75% NFPE completed. Seems to be related to patient's ambulation.  Labs reviewed:  BUN/Creatinine 38/1.27  Medications reviewed and include:  Calcium Carbonate, Iron, Prednisone, Zinc  NUTRITION - FOCUSED PHYSICAL EXAM:    Most Recent Value  Orbital Region  Moderate depletion  Upper Arm Region  No depletion  Thoracic and Lumbar Region  No depletion  Buccal Region  No depletion  Temple Region  No depletion  Clavicle Bone Region  No depletion  Clavicle and Acromion Bone Region  No depletion  Scapular Bone Region  Unable to assess  Dorsal Hand  Severe depletion  Patellar Region  Severe depletion  Anterior Thigh Region  Severe depletion  Posterior Calf Region  Severe depletion  Edema (RD Assessment)  Unable to assess       Diet Order:  Seizure precautions Diet regular Room service appropriate? Yes; Fluid consistency: Thin  EDUCATION NEEDS:   Not appropriate for  education at this time  Skin:  Skin Assessment: Skin Integrity Issues: Skin Integrity Issues:: Other (Comment) Other: non-healing vasular wounds: L 4th toe, R 4th toe, R medial lower leg, R medial leg  Last BM:  3/17  Height:   Ht Readings from Last 1 Encounters:  05/24/17 5\' 7"  (1.702 m)    Weight:   Wt Readings from Last 1 Encounters:  05/29/17 180 lb 5.4 oz (81.8 kg)    Ideal Body Weight:  67.2 kg  BMI:  Body mass index is 28.24 kg/m.  Estimated Nutritional Needs:   Kcal:  1900-2100  Protein:  90-105 gm  Fluid:  1.9-2.1 L  [name]. Robert Vanderheiden, MS, RD LDN Inpatient Clinical Dietitian Pager [phone]

## 2017-05-29 NOTE — Progress Notes (Signed)
   05/29/17 1400  Clinical Encounter Type  Visited With Patient and family together  Visit Type Initial;Spiritual support  Referral From Nurse  Consult/Referral To Chaplain  Patient seemed normal, was talkative and drinking coffee. Wife was with him. Prayer was given. Long conversation Nature conservation officer, wife, and patient, showing strong faith. Hopes to go home soon (per wife). - Actuary

## 2017-05-29 NOTE — Progress Notes (Addendum)
Progress Note  Patient Name: [name] Date of Encounter: 05/29/2017  Primary Cardiologist:   Skeet Latch, MD   Subjective   He was confused and not able to participate with rehab much today.  Denies SOB or pain.    Inpatient Medications    Scheduled Meds: . apixaban  5 mg Oral BID  . calcium carbonate  1,250 mg Oral BID WC  . collagenase   Topical Daily  . diltiazem  300 mg Oral Daily  . divalproex  250 mg Oral Daily   And  . divalproex  750 mg Oral QHS  . doxazosin  4 mg Oral Daily  . ezetimibe  5 mg Oral QHS  . feeding supplement (ENSURE ENLIVE)  237 mL Oral TID BM  . feeding supplement (PRO-STAT SUGAR FREE 64)  30 mL Oral BID  . ferrous sulfate  325 mg Oral BID WC  . furosemide  40 mg Oral Q3 days  . gabapentin  300 mg Oral BID  . ipratropium-albuterol  3 mL Nebulization TID  . levothyroxine  150 mcg Oral QAC breakfast  . mouth rinse  15 mL Mouth Rinse BID  . metoprolol tartrate  25 mg Oral BID  .  morphine injection  1 mg Intravenous Once  . pantoprazole (PROTONIX) IV  40 mg Intravenous Q12H  . pravastatin  40 mg Oral QHS  . predniSONE  10 mg Oral Q breakfast  . zinc sulfate  220 mg Oral q morning - 10a   Continuous Infusions: . ceFEPime (MAXIPIME) IV Stopped (05/29/17 0840)  . vancomycin     PRN Meds: acetaminophen **OR** acetaminophen, HYDROcodone-acetaminophen, lidocaine, nitroGLYCERIN, ondansetron **OR** ondansetron (ZOFRAN) IV   Vital Signs    Vitals:   05/29/17 0405 05/29/17 0413 05/29/17 0810 05/29/17 0815  BP: 133/68  126/76   Pulse: (!) 53  85   Resp: (!) 26     Temp: 97.9 F (36.6 C)     TempSrc:      SpO2: 93%   96%  Weight:  180 lb 5.4 oz (81.8 kg)    Height:        Intake/Output Summary (Last 24 hours) at 05/29/2017 0948 Last data filed at 05/29/2017 0405 Gross per 24 hour  Intake 840 ml  Output 1600 ml  Net -760 ml   Filed Weights   05/27/17 1710 05/28/17 0457 05/29/17 0413  Weight: 177 lb 4 oz (80.4 kg)  186 lb 4.6 oz (84.5 kg) 180 lb 5.4 oz (81.8 kg)    Telemetry    Atrial fib with controlled rate.  No sustained pauses.  - Personally Reviewed  ECG    NA - Personally Reviewed  Physical Exam   GEN: No acute distress.  Frail Neck: No  JVD Cardiac: Irregular RR, no murmurs, rubs, or gallops.  Respiratory: Clear  to auscultation bilaterally. GI: Soft, nontender, non-distended  MS: No  edema; No deformity. Neuro:  Nonfocal  Psych: Normal affect   Labs    Chemistry Recent Labs  Lab 05/23/17 2143  05/27/17 0824 05/28/17 1008 05/29/17 0639  NA 143   < > 142 142 142  K 3.3*   < > 3.6 4.0 3.9  CL 110   < > 112* 111 107  CO2 23   < > 22 21* 25  GLUCOSE 100*   < > 90 135* 87  BUN 28*   < > 23* 29* 38*  CREATININE 1.41*   < > 1.00 1.08 1.27*  CALCIUM 7.9*   < > 7.6* 7.8* 7.9*  PROT 5.0*  --   --   --   --   ALBUMIN 2.9*  --   --   --   --   AST 21  --   --   --   --   ALT 17  --   --   --   --   ALKPHOS 50  --   --   --   --   BILITOT 0.7  --   --   --   --   GFRNONAA 45*   < > >60 >60 51*  GFRAA 52*   < > >60 >60 59*  ANIONGAP 10   < > 8 10 10    < > = values in this interval not displayed.     Hematology Recent Labs  Lab 05/27/17 0824 05/28/17 1008 05/29/17 0639  WBC 8.3 8.2 8.3  RBC 2.78* 2.91* 2.89*  HGB 8.8* 8.9* 8.7*  HCT 28.3* 29.0* 29.0*  MCV 101.8* 99.7 100.3*  MCH 31.7 30.6 30.1  MCHC 31.1 30.7 30.0  RDW 15.1 14.7 14.6  PLT 136* 148* 153    Cardiac Enzymes Recent Labs  Lab 05/26/17 1344 05/26/17 1927 05/27/17 0225  TROPONINI 0.07* 0.06* 0.08*   No results for input(s): TROPIPOC in the last 168 hours.   BNPNo results for input(s): BNP, PROBNP in the last 168 hours.   DDimer No results for input(s): DDIMER in the last 168 hours.   Radiology    Dg Chest Port 1 View  Result Date: 05/27/2017 CLINICAL DATA:  Chest pain and worsening shortness of breath. EXAM: PORTABLE CHEST 1 VIEW COMPARISON:  Chest x-ray dated May 23, 2017. FINDINGS:  Stable cardiomegaly. Persistent low lung volumes and small left pleural effusion. Apparent new hazy density in the right mid lung is likely secondary to overlapping scapula. Unchanged patchy density at the left lung base. No pneumothorax. No acute osseous abnormality. Old right-sided rib fractures. IMPRESSION: Unchanged left lower lobe atelectasis versus infiltrate and small left pleural effusion. Electronically Signed   By: Titus Dubin M.D.   On: 05/27/2017 11:23    Cardiac Studies   NA  Patient Profile     84 y.o. male with a hx of hypertension, previous distant RCA stent, hyperlipidemia, diastolic CHF, atrial fibrillation on chronic anticoagulation, seizures, hypothyroidism, dementia, chronic lower extremity wound, CKD stage III and GERD who is being seen for the evaluation of chest pain and elevated troponin level at the request of Dr. Tyrell Antonio.   Assessment & Plan    ELEVATED TROPONIN:  No evidence of acute coronary event.  No further work up.  PERSISTENT ATRIAL FIB:  Rate is controlled on tele.  Few PVCs.  Continue current meds.    CHRONIC DIASTOLIC HF:  Creat is creeping up.  I will reduce the Laxis.  Might need to hold if creat continues to rise.   HTN:  BP is controlled.  Continue current therapy.    No new suggestions.  Please call with further questions.  Volume, BP and rate management per primary team.   For questions or updates, please contact [place] Please consult [email or web address] for contact info under Cardiology/STEMI.   Signed, Minus Breeding, MD  05/29/2017, 9:48 AM

## 2017-05-30 ENCOUNTER — Inpatient Hospital Stay (HOSPITAL_COMMUNITY): Payer: Medicare Other

## 2017-05-30 LAB — BASIC METABOLIC PANEL
Anion gap: 8 (ref 5–15)
BUN: 41 mg/dL — ABNORMAL HIGH (ref 6–20)
CO2: 26 mmol/L (ref 22–32)
Calcium: 8 mg/dL — ABNORMAL LOW (ref 8.9–10.3)
Chloride: 107 mmol/L (ref 101–111)
Creatinine, Ser: 1.14 mg/dL (ref 0.61–1.24)
GFR calc Af Amer: >60 mL/min (ref >60)
GFR calc non Af Amer: 58 mL/min — ABNORMAL LOW (ref >60)
Glucose, Bld: 81 mg/dL (ref 65–99)
Potassium: 3.7 mmol/L (ref 3.5–5.1)
Sodium: 141 mmol/L (ref 135–145)

## 2017-05-30 MED ORDER — SALINE SPRAY 0.65 % NA SOLN
1.0000 | NASAL | Status: DC | PRN
  Filled 2017-05-30: qty 44

## 2017-05-30 MED ORDER — PANTOPRAZOLE SODIUM 40 MG PO TBEC
40.0000 mg | DELAYED_RELEASE_TABLET | Freq: Two times a day (BID) | ORAL | Status: DC
  Administered 2017-05-30 – 2017-06-01 (×5): 40 mg via ORAL
  Filled 2017-05-30 (×5): qty 1

## 2017-05-30 MED ORDER — IOPAMIDOL (ISOVUE-300) INJECTION 61%
INTRAVENOUS | Status: AC
  Administered 2017-05-30: 100 mL
  Filled 2017-05-30: qty 100

## 2017-05-30 MED ORDER — IPRATROPIUM-ALBUTEROL 0.5-2.5 (3) MG/3ML IN SOLN
3.0000 mL | Freq: Two times a day (BID) | RESPIRATORY_TRACT | Status: DC
  Administered 2017-05-30 – 2017-06-01 (×4): 3 mL via RESPIRATORY_TRACT
  Filled 2017-05-30 (×4): qty 3

## 2017-05-30 MED ORDER — SALINE SPRAY 0.65 % NA SOLN
1.0000 | NASAL | Status: DC | PRN
  Administered 2017-05-30: 1 via NASAL
  Filled 2017-05-30: qty 44

## 2017-05-30 MED ORDER — DOXYCYCLINE HYCLATE 100 MG PO TABS
100.0000 mg | ORAL_TABLET | Freq: Two times a day (BID) | ORAL | Status: DC
  Administered 2017-05-30 – 2017-06-01 (×5): 100 mg via ORAL
  Filled 2017-05-30 (×5): qty 1

## 2017-05-30 NOTE — Consult Note (Signed)
   [facility] CM Inpatient Consult   05/30/2017  CASTIN DONAGHUE [birth year] [id number]   Follow up:  [facility] accepted [facility].  Met with wife and patient.  Wife sign consent.  Spoke with wife regarding [facility] Care management services for transportation and pharmacy needs. [name], wife, given the packet with [facility] Care Management information and information for transportation.  For questions, please contact:  Natividad Brood, RN BSN [facility] Liaison  [phone] business mobile phone Toll free office [phone]

## 2017-05-30 NOTE — Progress Notes (Signed)
Daily Progress Note   Patient Name: [name]       Date: 05/30/2017 DOB: [birth year]  Age: 84 y.o. MRN#: [id number] Attending Physician: Cristal Ford, DO Primary Care Physician: [name], MD Admit Date: 05/23/2017  Reason for Consultation/Follow-up: Establishing goals of care and Psychosocial/spiritual support  Subjective: He is more interactive today, smiling, tells me he feels "fair". He is hopeful for going home with home health.  Length of Stay: 6  Current Medications: Scheduled Meds:  . apixaban  5 mg Oral BID  . calcium carbonate  1,250 mg Oral BID WC  . collagenase   Topical Daily  . diltiazem  300 mg Oral Daily  . divalproex  250 mg Oral Daily   And  . divalproex  750 mg Oral QHS  . doxazosin  4 mg Oral Daily  . doxycycline  100 mg Oral Q12H  . ezetimibe  5 mg Oral QHS  . feeding supplement (ENSURE ENLIVE)  237 mL Oral TID BM  . feeding supplement (PRO-STAT SUGAR FREE 64)  30 mL Oral BID  . ferrous sulfate  325 mg Oral BID WC  . [START ON 05/31/2017] furosemide  20 mg Oral Q3 days  . gabapentin  300 mg Oral BID  . ipratropium-albuterol  3 mL Nebulization BID  . levothyroxine  150 mcg Oral QAC breakfast  . mouth rinse  15 mL Mouth Rinse BID  . metoprolol tartrate  25 mg Oral BID  . pantoprazole  40 mg Oral BID  . pravastatin  40 mg Oral QHS  . predniSONE  10 mg Oral Q breakfast  . zinc sulfate  220 mg Oral q morning - 10a    Continuous Infusions: . ceFEPime (MAXIPIME) IV Stopped (05/30/17 1310)    PRN Meds: acetaminophen **OR** acetaminophen, HYDROcodone-acetaminophen, lidocaine, nitroGLYCERIN, ondansetron **OR** ondansetron (ZOFRAN) IV, sodium chloride  Physical Exam          Vital Signs: BP (!) 156/75   Pulse (!) 110   Temp (!) 97.4 F (36.3 C) (Oral)    Resp (!) 22   Ht 5\' 7"  (1.702 m)   Wt 85.5 kg (188 lb 7.9 oz)   SpO2 96%   BMI 29.52 kg/m  SpO2: SpO2: 96 % O2 Device: O2 Device: Room Air O2 Flow Rate: O2 Flow Rate (L/min): 2 L/min  Intake/output summary:   Intake/Output Summary (Last 24 hours) at 05/30/2017 1427 Last data filed at 05/30/2017 1042 Gross per 24 hour  Intake 400 ml  Output 1725 ml  Net -1325 ml   LBM: Last BM Date: 05/27/17 Baseline Weight: Weight: 76.7 kg (169 lb 3.2 oz) Most recent weight: Weight: 85.5 kg (188 lb 7.9 oz)       Palliative Assessment/Data:    Flowsheet Rows     Most Recent Value  Intake Tab  Referral Department  Hospitalist  Unit at Time of Referral  Intermediate Care Unit  Palliative Care Primary Diagnosis  Sepsis/Infectious Disease  Date Notified  05/28/17  Palliative Care Type  New Palliative care  Reason for referral  Clarify Goals of Care  Date of Admission  05/23/17  Date first seen by Palliative Care  05/29/17  # of days Palliative referral  response time  1 Day(s)  # of days IP prior to Palliative referral  5  Clinical Assessment  Palliative Performance Scale Score  40%  Psychosocial & Spiritual Assessment  Palliative Care Outcomes  Patient/Family meeting held?  Yes  Palliative Care Outcomes  Clarified goals of care, Provided psychosocial or spiritual support, Counseled regarding hospice, Provided advance care planning      Patient Active Problem List   Diagnosis Date Noted  . Goals of care, counseling/discussion   . Palliative care by specialist   . HCAP (healthcare-associated pneumonia) 05/24/2017  . Acute metabolic encephalopathy 25/07/3974  . CKD (chronic kidney disease) stage 3, GFR 30-59 ml/min (HCC)   . CAP (community acquired pneumonia) 04/17/2017  . Atrial fibrillation with RVR ([place]) 04/17/2017  . Hallux rigidus, right foot 03/10/2016  . Lumbosacral spondylosis without myelopathy 10/29/2015  . Memory difficulty 09/22/2015  . Abnormality of gait 09/22/2015    . Seizures ([place]) 07/06/2015  . Hyperglycemia 07/06/2015  . Leukocytosis 07/06/2015  . Metabolic acidosis 73/41/9379  . Chronic pain 07/06/2015  . Chronic insomnia 03/30/2015  . Transient alteration of awareness 03/30/2015  . Abnormal liver function   . Sepsis ([place]) 01/31/2015  . Altered mental status 01/31/2015  . Essential hypertension 01/31/2015  . Constipation 01/31/2015  . Hypothyroidism 01/31/2015  . Seizure disorder ([place]) 01/31/2015  . Bladder outlet obstruction 01/31/2015  . GERD (gastroesophageal reflux disease) 01/31/2015  . Chronic back pain 01/31/2015  . Sigmoid diverticulitis 01/31/2015  . Acute kidney injury ([place]) 01/31/2015    Palliative Care Assessment & Plan   HPI: 84 y.o. male  with past medical history of HTN, HLD,  afib on chronic anticoagulation, seizures, hypothyroidism, dementia, chronic back pain, chronic lower extremity wounds, GERD, CKD 3, and CHF admitted on 05/23/2017 with fever and AMS. Found to be septic: lower extremity wound infection vs pna. Course has been complicated by acute dyspnea, chest pain, increasing drainage from leg wounds, diarrhea, poor oral intake, and acute metabolic encephalopathy. Of note, pt is refusing MRI for leg would evaluation. Patient had recent hospitalization 2/6-2/14 for same. He was d/c'd to rehab.  PMT consulted for [place].   Assessment: F/u meeting w/ patient and wife. He is more interactive today, smiling, tells me he feels "fair". He is hopeful for going home with home health. Wife has received more information about home health that can be provided and is feeling relieved about the type of services being offered. She keeps saying "its too good to be true".   Counseled wife on living will documents - provided clarification about parts that were confusing to her. She is hopeful for improvement in patient's mental status and then will complete documents. Also provided her with MOST forms to look over. She does not want to  change code status because she wants to have this discussion with patient - she is hopeful for cognitive improvement.   We again discussed expectations moving forward. Discussed that if she does not see improvement and sees continued decline in cognitive, functional, and nutritional status that hospice would be appropriate for patient.   Recommendations/Plan:  -full scope treatment  Move forward with home health plan  Goals of Care and Additional Recommendations:  Limitations on Scope of Treatment: Full Scope Treatment  Code Status:  Full code  Prognosis:   Unable to determine  Discharge Planning:  Home with [facility] was discussed with wife and patient  Thank you for allowing the Palliative Medicine Team to assist in the  care of this patient.   Time In: 11:00 Time Out: 12:00 Total Time 60 minutes Prolonged Time Billed  no       Greater than 50%  of this time was spent counseling and coordinating care related to the above assessment and plan.  Juel Burrow, DNP, AGNP-C Palliative Medicine Team Team Phone # [phone]

## 2017-05-30 NOTE — Progress Notes (Signed)
PROGRESS NOTE    [name]  VCB:[id number] DOB: 24-Feb-1934 DOA: 05/23/2017 PCP: [name], MD   Brief Narrative:  HPI on 05/24/2017 by Dr. Fuller Plan AIKEEM LILLEY is a 84 y.o. male with medical history significant of HTN, HLD, diastolic CHF, atrial fibrillation on chronic anticoagulation, seizures, hypothyroidism, dementia, chronic lower extremity wound, and GERD; who presents from rehab after being found to have fever with altered mental status.  History is obtained from the patient and wife and review of records as he is unable to give his own due to being acutely altered.   He had recently been hospitalized from 2/6-2/14 for sepsis secondary to pneumonia and/or chronic leg wounds treated with Zithromax and Rocephin.  At discharge patient was sent to rehab, and had per his wife been improving walking with a rolling walker with planned discharge home sometime next week.  However, around lunchtime patient was noted to be not responding like normally with a temperature of up to 101 F.  He was given Tylenol prior to EMS arrival.  She notes that he has had a mild intermittent cough.  Denies any recent seizure activity to her knowledge.  Reports that while he was at the facility though that they have been giving him more difficult than he normally takes at home.  Interim history Admitted for sepsis secondary to pneumonia versus wound infection.  Also found to have acute hypoxic respiratory failure.  Patient being seen by cardiology for elevated troponin.  Palliative care also consulted for goals of care conversation. Patient refusing MRI of the LE, will order CT.  Assessment & Plan   Sepsis secondary to pneumonia versus wound infection -Upon admission, patient was febrile with tachycardia, tachypnea -Chest x-ray reviewed and showed infiltrates on the left -was started on vancomycin and cefepime- will discontinue vancomycin today -Blood cultures showed no growth to date -Patient does  have chronic lower extremity wounds.  MRI ordered however patient refused on 05/28/2017.  Discussed with patient and wife today, will order CT lower ext as this may be more tolerable.  Acute hypoxic respiratory failure -Patient was given 1 dose of Solu-Medrol -Suspect secondary to pneumonia -Chest x-ray as above  -Continue nebulizer treatments -Continue supplemental oxygen to maintain oxygen saturations above 90%  Chest pain/mildly elevated troponin -Currently denies chest pain at this time -EKG showed no significant changes -Troponin relatively flat (0.06-0.08) -Cardiology consulted and appreciated, recommended no further workup  Acute kidney injury -Creatinine improved today, 1.14 -was 1.41 on admission however had improved to baseline of 1 -Cardiology adjusted lasix dose on 3/20 -continue to monitor BMP  Chronic diastolic heart failure -Echocardiogram 04/18/2017 showed EF of 60-65% -Currently does not appear to be volume overloaded -As above, Lasix dose changed due to acute kidney injury- 20mg  q3days -Monitor intake and output, daily weights  Iron deficiency anemia -Continue iron supplementation -Hemoglobin 8.7 (baseline 9-10) -Continue to monitor CBC  Chronic lower extremity wounds -Patient had ABI done recently which was unremarkable -Pending CT- see discussion above  Persistent atrial fibrillation -Cardiology consulted and appreciated-recommended continuing current medications -Continue Cardizem, Eliquis -HR better controlled today  Acute metabolic encephalopathy -Appears to be resolved at this time- wife at bedside states patient is not confused -Possibly related to infection versus medications  Essential hypertension -Continue Lasix, Cardura, diltiazem, metoprolol  Metabolic acidosis -Sodium bicarb held -Resolved  Diarrhea -Laxatives held -Continue to monitor  Dyslipidemia -Continue statin, Zetia  Hypokalemia -Resolved, continue to monitor  BMP  BPH -Continue Cardura  Hypothyroidism -  Continue Synthroid  GERD -Continue PPI  Seizure disorder -Continue Depakote  Goals of care -Palliative care consulted and appreciated to discuss [place] with patient's wife- palliative will meet with patient's wife today to review living will documents  Physical deconditioning -PT consulted, recommended SNF -Discussed with social work, patient may not have SNF days left.  He was recently at [facility]. -wife concerned that patient is too tall for therapists here and he may fall  DVT Prophylaxis  Eliquis  Code Status: Full  Family Communication: Wife at bedside  Disposition Plan: Admitted. Dispo pending (he was at [facility]).   Consultants Cardiology Palliative care  Procedures  None  Antibiotics   Anti-infectives (From admission, onward)   Start     Dose/Rate Route Frequency Ordered Stop   05/29/17 2000  vancomycin (VANCOCIN) IVPB 1000 mg/200 mL premix  Status:  Discontinued     1,000 mg 200 mL/hr over 60 Minutes Intravenous Every 24 hours 05/28/17 2113 05/30/17 0919   05/26/17 2200  ceFEPIme (MAXIPIME) 2 g in sodium chloride 0.9 % 100 mL IVPB  Status:  Discontinued     2 g 200 mL/hr over 30 Minutes Intravenous Every 12 hours 05/26/17 1230 05/26/17 1231   05/26/17 1245  ceFEPIme (MAXIPIME) 2 g in sodium chloride 0.9 % 100 mL IVPB     2 g 200 mL/hr over 30 Minutes Intravenous Every 12 hours 05/26/17 1230 06/01/17 0959   05/24/17 2200  ceFEPIme (MAXIPIME) 2 g in sodium chloride 0.9 % 100 mL IVPB  Status:  Discontinued     2 g 200 mL/hr over 30 Minutes Intravenous Every 24 hours 05/24/17 0348 05/26/17 1230   05/24/17 2000  vancomycin (VANCOCIN) 1,250 mg in sodium chloride 0.9 % 250 mL IVPB  Status:  Discontinued     1,250 mg 166.7 mL/hr over 90 Minutes Intravenous Every 24 hours 05/24/17 0405 05/28/17 2113   05/24/17 0030  ceFEPIme (MAXIPIME) 2 g in sodium chloride 0.9 % 100 mL IVPB     2 g 200 mL/hr over 30  Minutes Intravenous  Once 05/24/17 0015 05/24/17 0135   05/24/17 0030  vancomycin (VANCOCIN) IVPB 1000 mg/200 mL premix     1,000 mg 200 mL/hr over 60 Minutes Intravenous  Once 05/24/17 0027 05/24/17 0205      Subjective:   [name] seen and examined today.  No complaints today. Denies current chest pain, shortness of breath, abdominal pain, N/V/D/C.   Objective:   Vitals:   05/30/17 1000 05/30/17 1053 05/30/17 1100 05/30/17 1200  BP:  (!) 156/75    Pulse: 95 80 (!) 103 (!) 110  Resp: 13  18 (!) 22  Temp:      TempSrc:      SpO2: 94%  95% 96%  Weight:      Height:        Intake/Output Summary (Last 24 hours) at 05/30/2017 1201 Last data filed at 05/30/2017 1042 Gross per 24 hour  Intake 640 ml  Output 1725 ml  Net -1085 ml   Filed Weights   05/28/17 0457 05/29/17 0413 05/30/17 0520  Weight: 84.5 kg (186 lb 4.6 oz) 81.8 kg (180 lb 5.4 oz) 85.5 kg (188 lb 7.9 oz)   Exam  General: Well developed, chronically ill appearing, NAD, appears stated age  HEENT: NCAT, mucous membranes moist.   Neck: Suppl  Cardiovascular: S1 S2 auscultated, irregular  Respiratory: Clear to auscultation bilaterally with equal chest rise  Abdomen: Soft, nontender, nondistended, + bowel sounds  Extremities: warm dry without cyanosis clubbing, or edema. RLE dressing in place.  Neuro: Awake and alert, nonfocal  Psych: Appropriate mood and affect  Data Reviewed: I have personally reviewed following labs and imaging studies  CBC: Recent Labs  Lab 05/23/17 2143 05/25/17 0527 05/26/17 0809 05/26/17 1344 05/27/17 0824 05/28/17 1008 05/29/17 0639  WBC 11.0* 7.7 8.0  --  8.3 8.2 8.3  NEUTROABS 8.7* 5.9  --   --   --   --   --   HGB 9.3* 8.1* 8.1* 8.2* 8.8* 8.9* 8.7*  HCT 29.9* 26.7* 26.6* 26.6* 28.3* 29.0* 29.0*  MCV 100.7* 101.5* 102.3*  --  101.8* 99.7 100.3*  PLT 158 125* 126*  --  136* 148* 093   Basic Metabolic Panel: Recent Labs  Lab 05/26/17 0948 05/26/17 1317  05/27/17 0824 05/28/17 1008 05/28/17 1455 05/29/17 0639 05/30/17 0722  NA  --  142 142 142  --  142 141  K  --  4.5 3.6 4.0  --  3.9 3.7  CL  --  115* 112* 111  --  107 107  CO2  --  21* 22 21*  --  25 26  GLUCOSE  --  104* 90 135*  --  87 81  BUN  --  28* 23* 29*  --  38* 41*  CREATININE  --  1.02 1.00 1.08  --  1.27* 1.14  CALCIUM  --  7.4* 7.6* 7.8*  --  7.9* 8.0*  MG 1.9  --   --   --  1.9  --   --    GFR: Estimated Creatinine Clearance: 53.1 mL/min (by C-G formula based on SCr of 1.14 mg/dL). Liver Function Tests: Recent Labs  Lab 05/23/17 2143  AST 21  ALT 17  ALKPHOS 50  BILITOT 0.7  PROT 5.0*  ALBUMIN 2.9*   No results for input(s): LIPASE, AMYLASE in the last 168 hours. No results for input(s): AMMONIA in the last 168 hours. Coagulation Profile: Recent Labs  Lab 05/23/17 2143  INR 1.54   Cardiac Enzymes: Recent Labs  Lab 05/26/17 1344 05/26/17 1927 05/27/17 0225  TROPONINI 0.07* 0.06* 0.08*   BNP (last 3 results) No results for input(s): PROBNP in the last 8760 hours. HbA1C: No results for input(s): HGBA1C in the last 72 hours. CBG: Recent Labs  Lab 05/23/17 2140 05/26/17 1308 05/26/17 2057  GLUCAP 91 98 116*   Lipid Profile: No results for input(s): CHOL, HDL, LDLCALC, TRIG, CHOLHDL, LDLDIRECT in the last 72 hours. Thyroid Function Tests: No results for input(s): TSH, T4TOTAL, FREET4, T3FREE, THYROIDAB in the last 72 hours. Anemia Panel: No results for input(s): VITAMINB12, FOLATE, FERRITIN, TIBC, IRON, RETICCTPCT in the last 72 hours. Urine analysis:    Component Value Date/Time   COLORURINE STRAW (A) 05/24/2017 0014   APPEARANCEUR CLEAR 05/24/2017 0014   LABSPEC 1.009 05/24/2017 0014   PHURINE 5.0 05/24/2017 0014   GLUCOSEU NEGATIVE 05/24/2017 0014   HGBUR NEGATIVE 05/24/2017 0014   BILIRUBINUR NEGATIVE 05/24/2017 0014   KETONESUR NEGATIVE 05/24/2017 0014   PROTEINUR NEGATIVE 05/24/2017 0014   UROBILINOGEN 1.0 09/16/2007 1212    NITRITE NEGATIVE 05/24/2017 0014   LEUKOCYTESUR NEGATIVE 05/24/2017 0014   Sepsis Labs: @LABRCNTIP (procalcitonin:4,lacticidven:4)  ) Recent Results (from the past 240 hour(s))  Aerobic Culture (superficial specimen)     Status: None   Collection Time: 05/21/17 12:15 PM  Result Value Ref Range Status   Specimen Description   Final    TOE LEFT GREAT Performed  at [facility], [place] [address] [place], [place] [address]    Special Requests   Final    NONE Performed at [facility], [place] [address] [place], [place] [address]    Gram Stain   Final    NO WBC SEEN FEW GRAM POSITIVE COCCI Performed at [facility], [place] [address], [place], [place] [address]    Culture   Final    MODERATE METHICILLIN RESISTANT STAPHYLOCOCCUS AUREUS   Report Status 05/24/2017 FINAL  Final   Organism ID, Bacteria METHICILLIN RESISTANT STAPHYLOCOCCUS AUREUS  Final      Susceptibility   Methicillin resistant staphylococcus aureus - MIC*    CIPROFLOXACIN >=8 RESISTANT Resistant     ERYTHROMYCIN >=8 RESISTANT Resistant     GENTAMICIN <=0.5 SENSITIVE Sensitive     OXACILLIN >=4 RESISTANT Resistant     TETRACYCLINE <=1 SENSITIVE Sensitive     VANCOMYCIN 1 SENSITIVE Sensitive     TRIMETH/SULFA <=10 SENSITIVE Sensitive     CLINDAMYCIN >=8 RESISTANT Resistant     RIFAMPIN <=0.5 SENSITIVE Sensitive     Inducible Clindamycin NEGATIVE Sensitive     * MODERATE METHICILLIN RESISTANT STAPHYLOCOCCUS AUREUS  Culture, blood (Routine x 2)     Status: None   Collection Time: 05/23/17  9:45 PM  Result Value Ref Range Status   Specimen Description BLOOD RIGHT FOREARM  Final   Special Requests   Final    BOTTLES DRAWN AEROBIC AND ANAEROBIC Blood Culture adequate volume   Culture   Final    NO GROWTH 5 DAYS Performed at [facility], [address], [place], [place] [address]    Report Status 05/29/2017 FINAL  Final  Culture, blood (Routine x 2)     Status: None    Collection Time: 05/23/17  9:49 PM  Result Value Ref Range Status   Specimen Description BLOOD LEFT ARM  Final   Special Requests   Final    BOTTLES DRAWN AEROBIC AND ANAEROBIC Blood Culture adequate volume   Culture   Final    NO GROWTH 5 DAYS Performed at [place] [address], [place], [place] [address]    Report Status 05/29/2017 FINAL  Final  Urine Culture     Status: None   Collection Time: 05/24/17 12:14 AM  Result Value Ref Range Status   Specimen Description URINE, CATHETERIZED  Final   Special Requests NONE  Final   Culture   Final    NO GROWTH Performed at [facility], [place] [address], [place],  [address]    Report Status 05/25/2017 FINAL  Final  Respiratory Panel by PCR     Status: None   Collection Time: 05/24/17  4:56 AM  Result Value Ref Range Status   Adenovirus NOT DETECTED NOT DETECTED Final   Coronavirus 229E NOT DETECTED NOT DETECTED Final   Coronavirus HKU1 NOT DETECTED NOT DETECTED Final   Coronavirus NL63 NOT DETECTED NOT DETECTED Final   Coronavirus OC43 NOT DETECTED NOT DETECTED Final   Metapneumovirus NOT DETECTED NOT DETECTED Final   Rhinovirus / Enterovirus NOT DETECTED NOT DETECTED Final   Influenza A NOT DETECTED NOT DETECTED Final   Influenza B NOT DETECTED NOT DETECTED Final   Parainfluenza Virus 1 NOT DETECTED NOT DETECTED Final   Parainfluenza Virus 2 NOT DETECTED NOT DETECTED Final   Parainfluenza Virus 3 NOT DETECTED NOT DETECTED Final   Parainfluenza Virus 4 NOT DETECTED NOT DETECTED Final  Respiratory Syncytial Virus NOT DETECTED NOT DETECTED Final   Bordetella pertussis NOT DETECTED NOT DETECTED Final   Chlamydophila pneumoniae NOT DETECTED NOT DETECTED Final   Mycoplasma pneumoniae NOT DETECTED NOT DETECTED Final    Comment: Performed at [facility], [place] [address], [place], [place] [address]  MRSA PCR Screening     Status: None   Collection Time: 05/28/17  9:40 AM  Result Value Ref Range Status    MRSA by PCR NEGATIVE NEGATIVE Final    Comment:        The GeneXpert MRSA Assay (FDA approved for NASAL specimens only), is one component of a comprehensive MRSA colonization surveillance program. It is not intended to diagnose MRSA infection nor to guide or monitor treatment for MRSA infections. Performed at [facility], [place] [address] [place], [place] [address]       Radiology Studies: No results found.   Scheduled Meds: . apixaban  5 mg Oral BID  . calcium carbonate  1,250 mg Oral BID WC  . collagenase   Topical Daily  . diltiazem  300 mg Oral Daily  . divalproex  250 mg Oral Daily   And  . divalproex  750 mg Oral QHS  . doxazosin  4 mg Oral Daily  . ezetimibe  5 mg Oral QHS  . feeding supplement (ENSURE ENLIVE)  237 mL Oral TID BM  . feeding supplement (PRO-STAT SUGAR FREE 64)  30 mL Oral BID  . ferrous sulfate  325 mg Oral BID WC  . [START ON 05/31/2017] furosemide  20 mg Oral Q3 days  . gabapentin  300 mg Oral BID  . ipratropium-albuterol  3 mL Nebulization BID  . levothyroxine  150 mcg Oral QAC breakfast  . mouth rinse  15 mL Mouth Rinse BID  . metoprolol tartrate  25 mg Oral BID  .  morphine injection  1 mg Intravenous Once  . pantoprazole  40 mg Oral BID  . pravastatin  40 mg Oral QHS  . predniSONE  10 mg Oral Q breakfast  . zinc sulfate  220 mg Oral q morning - 10a   Continuous Infusions: . ceFEPime (MAXIPIME) IV 2 g (05/30/17 1053)     LOS: 6 days   Time Spent in minutes   30 minutes  Dmarcus Decicco D.O. on 05/30/2017 at 12:01 PM  Between 7am to 7pm - Pager - [phone]  After 7pm go to [email or web address] - password [email or web address]  And look for the night coverage person covering for me after hours  [facility] Office  [phone]

## 2017-05-31 LAB — BASIC METABOLIC PANEL
Anion gap: 9 (ref 5–15)
BUN: 34 mg/dL — ABNORMAL HIGH (ref 6–20)
CO2: 27 mmol/L (ref 22–32)
Calcium: 8 mg/dL — ABNORMAL LOW (ref 8.9–10.3)
Chloride: 103 mmol/L (ref 101–111)
Creatinine, Ser: 1.12 mg/dL (ref 0.61–1.24)
GFR calc Af Amer: >60 mL/min (ref >60)
GFR calc non Af Amer: 60 mL/min — ABNORMAL LOW (ref >60)
Glucose, Bld: 82 mg/dL (ref 65–99)
Potassium: 3.6 mmol/L (ref 3.5–5.1)
Sodium: 139 mmol/L (ref 135–145)

## 2017-05-31 LAB — CBC
HCT: 30.1 % — ABNORMAL LOW (ref 39.0–52.0)
Hemoglobin: 9.5 g/dL — ABNORMAL LOW (ref 13.0–17.0)
MCH: 31.6 pg (ref 26.0–34.0)
MCHC: 31.6 g/dL (ref 30.0–36.0)
MCV: 100 fL (ref 78.0–100.0)
Platelets: 172 10*3/uL (ref 150–400)
RBC: 3.01 MIL/uL — ABNORMAL LOW (ref 4.22–5.81)
RDW: 15.1 % (ref 11.5–15.5)
WBC: 9.8 10*3/uL (ref 4.0–10.5)

## 2017-05-31 LAB — MAGNESIUM: Magnesium: 1.9 mg/dL (ref 1.7–2.4)

## 2017-05-31 MED ORDER — POTASSIUM CHLORIDE CRYS ER 20 MEQ PO TBCR
40.0000 meq | EXTENDED_RELEASE_TABLET | Freq: Once | ORAL | Status: AC
  Administered 2017-05-31: 40 meq via ORAL
  Filled 2017-05-31: qty 2

## 2017-05-31 NOTE — Progress Notes (Signed)
Pt. Was reported to have beats run of V-Tach. MD notified.

## 2017-05-31 NOTE — Progress Notes (Signed)
Physical Therapy Treatment Patient Details Name: [name] MRN: [id number] DOB: [birth year] Today's Date: 05/31/2017    History of Present Illness 84yo male brought to the ED with confusion, found to be in A-fib with RVR by EMS. DIagnosed with sepsis, new A-fib with RVR, acute encephalopathy. PMH chronic R LE wound, HTN, CKD, seizures, memory loss, hypothyroidism, OA, skin CA, hx vertigo, hx back surgery, hx cardiac cath, knee arthoplasty, laminectomy, posterior cervical fusion     PT Comments    Patient received in bed, pleasant and willing to participate in PT this afternoon. He required [place] x2 for functional bed mobility as well as functional transfers today; when he reached full standing position, monitor showed V-tach, patient stating "I don't feel well at all" and patient was immediately returned to sitting position where V-tach rhythm normalized, however PT deferred further activity/mobility today and returned patient to supine. RN aware of V-tach run with activity. He was left in bed with all needs met, alarm activated, and family present. SNF recommendation remains appropriate.     Follow Up Recommendations  SNF(return to [facility] )     Equipment Recommendations  None recommended by PT    Recommendations for Other Services       Precautions / Restrictions Precautions Precautions: Fall Restrictions Weight Bearing Restrictions: No    Mobility  Bed Mobility Overal bed mobility: Needs Assistance Bed Mobility: Supine to Sit;Sit to Supine     Supine to sit: Mod assist;+2 for physical assistance Sit to supine: Mod assist;+2 for physical assistance   General bed mobility comments: ModA x2 to manage LEs and bring trunk up to EOB; mild leaning posteriorly and L noted requiring min guard at edge of bed today   Transfers Overall transfer level: Needs assistance Equipment used: Rolling walker (2 wheeled) Transfers: Sit to/from Stand Sit to Stand: Mod assist;+2 physical  assistance         General transfer comment: ModA x2 with RW today, VC and TC for correct hand placement and sequencing, safety; once standing, monitor showed multiple beats of V-tach and patient was returned to sitting where V-tach resolved   Ambulation/Gait             General Gait Details: unable at this time   Stairs            Wheelchair Mobility    Modified Rankin (Stroke Patients Only)       Balance Overall balance assessment: Needs assistance Sitting-balance support: Feet supported;No upper extremity supported Sitting balance-Leahy Scale: Fair   Postural control: Posterior lean;Left lateral lean Standing balance support: Bilateral upper extremity supported;During functional activity Standing balance-Leahy Scale: Poor Standing balance comment: ModA x2 and RW to stand                             Cognition Arousal/Alertness: Awake/alert Behavior During Therapy: Flat affect Overall Cognitive Status: Impaired/Different from baseline(dementia at baseline ) Area of Impairment: Memory;Following commands;Safety/judgement;Awareness;Problem solving;Orientation                 Orientation Level: Disoriented to;Time;Situation   Memory: Decreased short-term memory Following Commands: Follows one step commands with increased time;Follows one step commands inconsistently Safety/Judgement: Decreased awareness of safety;Decreased awareness of deficits Awareness: Intellectual Problem Solving: Slow processing;Decreased initiation;Difficulty sequencing;Requires verbal cues;Requires tactile cues General Comments: patient confused, requires repetitive max-mod cues to follow directional commands       Exercises      General Comments General  comments (skin integrity, edema, etc.): V-tach in standing, resolved with return to sitting position       Pertinent Vitals/Pain Pain Assessment: No/denies pain    Home Living                       Prior Function            PT Goals (current goals can now be found in the care plan section) Acute Rehab PT Goals Patient Stated Goal: didn't state PT Goal Formulation: With patient/family Time For Goal Achievement: 06/11/17 Potential to Achieve Goals: Good Progress towards PT goals: Progressing toward goals    Frequency    Min 3X/week      PT Plan Current plan remains appropriate    Co-evaluation              AM-PAC PT "6 Clicks" Daily Activity  Outcome Measure  Difficulty turning over in bed (including adjusting bedclothes, sheets and blankets)?: Unable Difficulty moving from lying on back to sitting on the side of the bed? : Unable Difficulty sitting down on and standing up from a chair with arms (e.g., wheelchair, bedside commode, etc,.)?: Unable Help needed moving to and from a bed to chair (including a wheelchair)?: A Little Help needed walking in hospital room?: Total Help needed climbing 3-5 steps with a railing? : Total 6 Click Score: 8    End of Session Equipment Utilized During Treatment: Gait belt Activity Tolerance: Treatment limited secondary to medical complications (Comment)(V-tach in standing ) Patient left: in bed;with bed alarm set;with family/visitor present;with call bell/phone within reach Nurse Communication: Other (comment)(V-tach with activity ) PT Visit Diagnosis: Unsteadiness on feet (R26.81);Muscle weakness (generalized) (M62.81)     Time: 1599-6895 PT Time Calculation (min) (ACUTE ONLY): 24 min  Charges:  $Therapeutic Activity: 23-37 mins                    G Codes:       Deniece Ree PT, DPT, CBIS  Supplemental Physical Therapist [place]   Pager [phone]

## 2017-05-31 NOTE — Progress Notes (Signed)
Pt has hx of CHF, needs bed to help with repositioning of body parts and head elevated  30-35 degree. Whitman Hero RN,BSN,CM

## 2017-05-31 NOTE — Progress Notes (Addendum)
    Durable Medical Equipment  (From admission, onward)        Start     Ordered   05/31/17 1058  For home use only DME Hospital bed  Once    Question Answer Comment  Bed type Semi-electric   Hoyer Lift Yes      05/31/17 1059        Durable Medical Equipment  (From admission, onward)        Start     Ordered   05/31/17 1108  For home use only DME Hospital bed  Once    Comments:  Pt requires maxA and is unable to ambulate at this time. Pt with severely impaired processing limiting ability to follow commands and transfer to chair.  Question Answer Comment  Patient has (list medical condition): hx of HTN, HLD, diastolic CHF, atrial fibrillation on chronic anticoagulation, seizures, hypothyroidism, dementia, chronic lower extremity wound, and GERD   Bed type Semi-electric   Hoyer Lift Yes      05/31/17 1111   05/31/17 1107  For home use only DME standard manual wheelchair with seat cushion  Once    Comments:  Patient suffers from physical deconditioning which impairs their ability to perform daily activities in the home.  A walker will not resolve this issue with performing activities of daily living. A wheelchair will allow patient to safely perform daily activities. Patient can safely propel the wheelchair in the home or has a caregiver who can provide assistance.  Accessories: elevating leg rests (ELRs), wheel locks, extensions and anti-tippers.   05/31/17 1107

## 2017-05-31 NOTE — Progress Notes (Signed)
PROGRESS NOTE    [name]  ZDG:[id number] DOB: [birth year] DOA: 05/23/2017 PCP: [name], MD   Brief Narrative:  HPI on 05/24/2017 by Dr. Fuller Plan [name] is a 84 y.o. male with medical history significant of HTN, HLD, diastolic CHF, atrial fibrillation on chronic anticoagulation, seizures, hypothyroidism, dementia, chronic lower extremity wound, and GERD; who presents from rehab after being found to have fever with altered mental status.  History is obtained from the patient and wife and review of records as he is unable to give his own due to being acutely altered.   He had recently been hospitalized from 2/6-2/14 for sepsis secondary to pneumonia and/or chronic leg wounds treated with Zithromax and Rocephin.  At discharge patient was sent to rehab, and had per his wife been improving walking with a rolling walker with planned discharge home sometime next week.  However, around lunchtime patient was noted to be not responding like normally with a temperature of up to 101 F.  He was given Tylenol prior to EMS arrival.  She notes that he has had a mild intermittent cough.  Denies any recent seizure activity to her knowledge.  Reports that while he was at the facility though that they have been giving him more difficult than he normally takes at home.  Interim history Admitted for sepsis secondary to pneumonia versus wound infection.  Also found to have acute hypoxic respiratory failure.  Patient being seen by cardiology for elevated troponin.  Palliative care also consulted for goals of care conversation. Patient refusing MRI of the LE, will order CT.  Assessment & Plan   Sepsis secondary to pneumonia versus wound infection -Upon admission, patient was febrile with tachycardia, tachypnea -Chest x-ray reviewed and showed infiltrates on the left -was started on vancomycin and cefepime- will discontinue vancomycin today -Blood cultures showed no growth to date -Patient does  have chronic lower extremity wounds.  MRI ordered however patient refused on 05/28/2017.   -CT lower extremities ordered and unremarkable for osteomyelitis.  -patient had wound culture prior to admission-05/21/17, which showed MRSA -placed on doxycycline  Acute hypoxic respiratory failure -Patient was given 1 dose of Solu-Medrol -Suspect secondary to pneumonia -Chest x-ray as above  -Continue nebulizer treatments -Continue supplemental oxygen to maintain oxygen saturations above 90%  Chest pain/mildly elevated troponin -Currently denies chest pain at this time -EKG showed no significant changes -Troponin relatively flat (0.06-0.08) -Cardiology consulted and appreciated, recommended no further workup  Acute kidney injury -Creatinine improved today, 1.12 -was 1.41 on admission however had improved to baseline of 1 -Cardiology adjusted lasix dose on 3/20 -continue to monitor BMP  Chronic diastolic heart failure -Echocardiogram 04/18/2017 showed EF of 60-65% -Currently does not appear to be volume overloaded -As above, Lasix dose changed due to acute kidney injury- 20mg  q3days -Monitor intake and output, daily weights  Iron deficiency anemia -Continue iron supplementation -Hemoglobin 9.5 (baseline 9-10) -Continue to monitor CBC  Chronic lower extremity wounds -Patient had ABI done recently which was unremarkable -CT showed no evidence of osteo  Persistent atrial fibrillation -Cardiology consulted and appreciated-recommended continuing current medications -Continue Cardizem, Eliquis -HR better controlled today  Acute metabolic encephalopathy -Appears to be resolved at this time- wife at bedside states patient is not confused -Possibly related to infection versus medications  Essential hypertension -Continue Lasix, Cardura, diltiazem, metoprolol  Metabolic acidosis -Sodium bicarb held -Resolved  Diarrhea -Laxatives held -Continue to monitor  Dyslipidemia -Continue  statin, Zetia  Hypokalemia -Resolved, continue to monitor  BMP  BPH -Continue Cardura  Hypothyroidism -Continue Synthroid  GERD -Continue PPI  Seizure disorder -Continue Depakote  Goals of care -Palliative care consulted and appreciated to discuss [place] with patient's wife- palliative will meet with patient's wife today to review living will documents  Physical deconditioning -PT consulted, recommended SNF -Discussed with social work, patient may not have SNF days left.  He was recently at [facility]. -patient will be discharged home with home health services, hospital bed, hoyer lift, wheelchair -case management arranging   History of knee osteoarthritis  -s/p B/L arthroplasty by Dr. Durward Fortes -CT RLE did show Prior constrained right total knee arthroplasty with increased lucency along the posterior aspect of the distal femoral stem, concerning for loosening. -Have placed a call to Dr. Durward Fortes, pending call back -suspect this can be followed as an outpatient   DVT Prophylaxis  Eliquis  Code Status: Full  Family Communication: Wife at bedside  Disposition Plan: Admitted. Likely discharge to home with [facility] on 3/23.  Consultants Cardiology Palliative care  Procedures  None  Antibiotics   Anti-infectives (From admission, onward)   Start     Dose/Rate Route Frequency Ordered Stop   05/30/17 1315  doxycycline (VIBRA-TABS) tablet 100 mg     100 mg Oral Every 12 hours 05/30/17 1302     05/29/17 2000  vancomycin (VANCOCIN) IVPB 1000 mg/200 mL premix  Status:  Discontinued     1,000 mg 200 mL/hr over 60 Minutes Intravenous Every 24 hours 05/28/17 2113 05/30/17 0919   05/26/17 2200  ceFEPIme (MAXIPIME) 2 g in sodium chloride 0.9 % 100 mL IVPB  Status:  Discontinued     2 g 200 mL/hr over 30 Minutes Intravenous Every 12 hours 05/26/17 1230 05/26/17 1231   05/26/17 1245  ceFEPIme (MAXIPIME) 2 g in sodium chloride 0.9 % 100 mL IVPB     2 g 200 mL/hr over  30 Minutes Intravenous Every 12 hours 05/26/17 1230 05/30/17 2325   05/24/17 2200  ceFEPIme (MAXIPIME) 2 g in sodium chloride 0.9 % 100 mL IVPB  Status:  Discontinued     2 g 200 mL/hr over 30 Minutes Intravenous Every 24 hours 05/24/17 0348 05/26/17 1230   05/24/17 2000  vancomycin (VANCOCIN) 1,250 mg in sodium chloride 0.9 % 250 mL IVPB  Status:  Discontinued     1,250 mg 166.7 mL/hr over 90 Minutes Intravenous Every 24 hours 05/24/17 0405 05/28/17 2113   05/24/17 0030  ceFEPIme (MAXIPIME) 2 g in sodium chloride 0.9 % 100 mL IVPB     2 g 200 mL/hr over 30 Minutes Intravenous  Once 05/24/17 0015 05/24/17 0135   05/24/17 0030  vancomycin (VANCOCIN) IVPB 1000 mg/200 mL premix     1,000 mg 200 mL/hr over 60 Minutes Intravenous  Once 05/24/17 0027 05/24/17 0205      Subjective:   [name] seen and examined today.  Patient has no complaints today. Denies chest pain, shortness of breath, abdominal pain, N/V/d/C.   Objective:   Vitals:   05/31/17 0700 05/31/17 0800 05/31/17 0900 05/31/17 1047  BP:    123/83  Pulse: 85 99 86   Resp: 19 18 19    Temp:  (!) 97.4 F (36.3 C)    TempSrc:  Oral    SpO2: 95% 96% 93%   Weight:      Height:        Intake/Output Summary (Last 24 hours) at 05/31/2017 1104 Last data filed at 05/30/2017 1804 Gross per 24 hour  Intake -  Output 675 ml  Net -675 ml   Filed Weights   05/29/17 0413 05/30/17 0520 05/31/17 0401  Weight: 81.8 kg (180 lb 5.4 oz) 85.5 kg (188 lb 7.9 oz) 81 kg (178 lb 9.2 oz)  Exam  General: Well developed, chronically ill appearing, NAD  HEENT: NCAT, mucous membranes moist.   Cardiovascular: S1 S2 auscultated,irregular  Respiratory: Clear to auscultation bilaterally with equal chest rise  Abdomen: Soft, nontender, nondistended, + bowel sounds  Extremities: warm dry without cyanosis clubbing or edema. RLE with dressing place  Data Reviewed: I have personally reviewed following labs and imaging studies  CBC: Recent  Labs  Lab 05/25/17 0527 05/26/17 0809 05/26/17 1344 05/27/17 0824 05/28/17 1008 05/29/17 0639 05/31/17 0425  WBC 7.7 8.0  --  8.3 8.2 8.3 9.8  NEUTROABS 5.9  --   --   --   --   --   --   HGB 8.1* 8.1* 8.2* 8.8* 8.9* 8.7* 9.5*  HCT 26.7* 26.6* 26.6* 28.3* 29.0* 29.0* 30.1*  MCV 101.5* 102.3*  --  101.8* 99.7 100.3* 100.0  PLT 125* 126*  --  136* 148* 153 323   Basic Metabolic Panel: Recent Labs  Lab 05/26/17 0948  05/27/17 0824 05/28/17 1008 05/28/17 1455 05/29/17 0639 05/30/17 0722 05/31/17 0425  NA  --    < > 142 142  --  142 141 139  K  --    < > 3.6 4.0  --  3.9 3.7 3.6  CL  --    < > 112* 111  --  107 107 103  CO2  --    < > 22 21*  --  25 26 27   GLUCOSE  --    < > 90 135*  --  87 81 82  BUN  --    < > 23* 29*  --  38* 41* 34*  CREATININE  --    < > 1.00 1.08  --  1.27* 1.14 1.12  CALCIUM  --    < > 7.6* 7.8*  --  7.9* 8.0* 8.0*  MG 1.9  --   --   --  1.9  --   --   --    < > = values in this interval not displayed.   GFR: Estimated Creatinine Clearance: 52.8 mL/min (by C-G formula based on SCr of 1.12 mg/dL). Liver Function Tests: No results for input(s): AST, ALT, ALKPHOS, BILITOT, PROT, ALBUMIN in the last 168 hours. No results for input(s): LIPASE, AMYLASE in the last 168 hours. No results for input(s): AMMONIA in the last 168 hours. Coagulation Profile: No results for input(s): INR, PROTIME in the last 168 hours. Cardiac Enzymes: Recent Labs  Lab 05/26/17 1344 05/26/17 1927 05/27/17 0225  TROPONINI 0.07* 0.06* 0.08*   BNP (last 3 results) No results for input(s): PROBNP in the last 8760 hours. HbA1C: No results for input(s): HGBA1C in the last 72 hours. CBG: Recent Labs  Lab 05/26/17 1308 05/26/17 2057  GLUCAP 98 116*   Lipid Profile: No results for input(s): CHOL, HDL, LDLCALC, TRIG, CHOLHDL, LDLDIRECT in the last 72 hours. Thyroid Function Tests: No results for input(s): TSH, T4TOTAL, FREET4, T3FREE, THYROIDAB in the last 72  hours. Anemia Panel: No results for input(s): VITAMINB12, FOLATE, FERRITIN, TIBC, IRON, RETICCTPCT in the last 72 hours. Urine analysis:    Component Value Date/Time   COLORURINE STRAW (A) 05/24/2017 0014   APPEARANCEUR CLEAR 05/24/2017 0014   LABSPEC 1.009 05/24/2017 0014  PHURINE 5.0 05/24/2017 0014   GLUCOSEU NEGATIVE 05/24/2017 0014   HGBUR NEGATIVE 05/24/2017 0014   BILIRUBINUR NEGATIVE 05/24/2017 0014   KETONESUR NEGATIVE 05/24/2017 0014   PROTEINUR NEGATIVE 05/24/2017 0014   UROBILINOGEN 1.0 09/16/2007 1212   NITRITE NEGATIVE 05/24/2017 0014   LEUKOCYTESUR NEGATIVE 05/24/2017 0014   Sepsis Labs: @LABRCNTIP (procalcitonin:4,lacticidven:4)  ) Recent Results (from the past 240 hour(s))  Aerobic Culture (superficial specimen)     Status: None   Collection Time: 05/21/17 12:15 PM  Result Value Ref Range Status   Specimen Description   Final    TOE LEFT GREAT Performed at [facility] [address], [place], [place] [address]    Special Requests   Final    NONE Performed at [facility], [place] [address] [place], [place] [address]    Gram Stain   Final    NO WBC SEEN FEW GRAM POSITIVE COCCI Performed at [facility], [place] [address] [place], [place] [address]    Culture   Final    MODERATE METHICILLIN RESISTANT STAPHYLOCOCCUS AUREUS   Report Status 05/24/2017 FINAL  Final   Organism ID, Bacteria METHICILLIN RESISTANT STAPHYLOCOCCUS AUREUS  Final      Susceptibility   Methicillin resistant staphylococcus aureus - MIC*    CIPROFLOXACIN >=8 RESISTANT Resistant     ERYTHROMYCIN >=8 RESISTANT Resistant     GENTAMICIN <=0.5 SENSITIVE Sensitive     OXACILLIN >=4 RESISTANT Resistant     TETRACYCLINE <=1 SENSITIVE Sensitive     VANCOMYCIN 1 SENSITIVE Sensitive     TRIMETH/SULFA <=10 SENSITIVE Sensitive     CLINDAMYCIN >=8 RESISTANT Resistant     RIFAMPIN <=0.5 SENSITIVE Sensitive     Inducible Clindamycin NEGATIVE Sensitive      * MODERATE METHICILLIN RESISTANT STAPHYLOCOCCUS AUREUS  Culture, blood (Routine x 2)     Status: None   Collection Time: 05/23/17  9:45 PM  Result Value Ref Range Status   Specimen Description BLOOD RIGHT FOREARM  Final   Special Requests   Final    BOTTLES DRAWN AEROBIC AND ANAEROBIC Blood Culture adequate volume   Culture   Final    NO GROWTH 5 DAYS Performed at [facility], [address], [place], [place] [address]    Report Status 05/29/2017 FINAL  Final  Culture, blood (Routine x 2)     Status: None   Collection Time: 05/23/17  9:49 PM  Result Value Ref Range Status   Specimen Description BLOOD LEFT ARM  Final   Special Requests   Final    BOTTLES DRAWN AEROBIC AND ANAEROBIC Blood Culture adequate volume   Culture   Final    NO GROWTH 5 DAYS Performed at [facility] [address] [place], [place] [address]    Report Status 05/29/2017 FINAL  Final  Urine Culture     Status: None   Collection Time: 05/24/17 12:14 AM  Result Value Ref Range Status   Specimen Description URINE, CATHETERIZED  Final   Special Requests NONE  Final   Culture   Final    NO GROWTH Performed at [facility], [place] [address], [place], [place] [address]    Report Status 05/25/2017 FINAL  Final  Respiratory Panel by PCR     Status: None   Collection Time: 05/24/17  4:56 AM  Result Value Ref Range Status   Adenovirus NOT DETECTED NOT DETECTED Final   Coronavirus 229E NOT DETECTED NOT DETECTED Final  Coronavirus HKU1 NOT DETECTED NOT DETECTED Final   Coronavirus NL63 NOT DETECTED NOT DETECTED Final   Coronavirus OC43 NOT DETECTED NOT DETECTED Final   Metapneumovirus NOT DETECTED NOT DETECTED Final   Rhinovirus / Enterovirus NOT DETECTED NOT DETECTED Final   Influenza A NOT DETECTED NOT DETECTED Final   Influenza B NOT DETECTED NOT DETECTED Final   Parainfluenza Virus 1 NOT DETECTED NOT DETECTED Final   Parainfluenza Virus 2 NOT DETECTED NOT DETECTED Final    Parainfluenza Virus 3 NOT DETECTED NOT DETECTED Final   Parainfluenza Virus 4 NOT DETECTED NOT DETECTED Final   Respiratory Syncytial Virus NOT DETECTED NOT DETECTED Final   Bordetella pertussis NOT DETECTED NOT DETECTED Final   Chlamydophila pneumoniae NOT DETECTED NOT DETECTED Final   Mycoplasma pneumoniae NOT DETECTED NOT DETECTED Final    Comment: Performed at [facility], [place] [address] [place], [place] [address]  MRSA PCR Screening     Status: None   Collection Time: 05/28/17  9:40 AM  Result Value Ref Range Status   MRSA by PCR NEGATIVE NEGATIVE Final    Comment:        The GeneXpert MRSA Assay (FDA approved for NASAL specimens only), is one component of a comprehensive MRSA colonization surveillance program. It is not intended to diagnose MRSA infection nor to guide or monitor treatment for MRSA infections. Performed at [facility], [place] [address], [place], [place] [address]       Radiology Studies: Ct Extremity Lower Left W Contrast  Result Date: 05/30/2017 CLINICAL DATA:  Chronic bilateral lower extremity wounds. Evaluate for osteomyelitis. Patient is refusing MRI. EXAM: CT OF THE LOWER LEFT EXTREMITY WITH CONTRAST TECHNIQUE: Multidetector CT imaging of the lower left leg was performed according to the standard protocol following intravenous contrast administration. COMPARISON:  Multiple prior lower extremity x-rays. CONTRAST:  171mL ISOVUE-300 IOPAMIDOL (ISOVUE-300) INJECTION 61% FINDINGS: Bones/Joint/Cartilage Prior left total knee arthroplasty. No evidence of hardware failure or loosening. No acute fracture or dislocation. No cortical destruction or osteolysis. The talar dome is intact. The ankle mortise is symmetric. No tibiotalar joint effusion. Small knee joint effusion and/or synovitis. Mild osteoarthritis of the second tarsometatarsal joint. Ligaments Suboptimally assessed by CT. Muscles and Tendons Mild atrophy of the distal semimembranosus muscle and  muscles of the lower leg. The visualized tendons are grossly intact. Soft tissues No fluid collection.  Vascular calcifications. IMPRESSION: 1. No CT evidence of osteomyelitis. No soft tissue fluid collection. Electronically Signed   By: Titus Dubin M.D.   On: 05/30/2017 15:32   Ct Extremity Lower Right W Contrast  Result Date: 05/30/2017 CLINICAL DATA:  Chronic lower extremity wounds. Evaluate for osteomyelitis. Patient is refusing MRI. EXAM: CT OF THE LOWER RIGHT EXTREMITY WITH CONTRAST TECHNIQUE: Multidetector CT imaging of the lower right leg was performed according to the standard protocol following intravenous contrast administration. COMPARISON:  Multiple prior lower extremity x-rays. CONTRAST:  134mL ISOVUE-300 IOPAMIDOL (ISOVUE-300) INJECTION 61% FINDINGS: Bones/Joint/Cartilage Prior constrained right total knee arthroplasty. There is some increased lucency along the posterior aspect of the distal femoral stem. Minimal lucency along the tibial stem measures less than 2 mm and is likely within normal limits. No acute fracture or dislocation. No cortical destruction or osteolysis. The talar dome is intact. The ankle mortise is symmetric. No tibiotalar joint effusion. Small knee joint effusion. Moderate osteoarthritis of the naviculocuneiform joint. Mild first and second tarsometatarsal joint osteoarthritis. Ligaments Suboptimally assessed by CT. Muscles and Tendons Prominent atrophy of  the distal semimembranosus muscle, and peroneal and flexor muscles in the lower leg. Mild fatty atrophy of the soleus muscle. The visualized tendons are grossly intact. Soft tissues No fluid collection.  Mild subcutaneous edema over the dorsal foot. IMPRESSION: 1. No CT evidence of osteomyelitis.  No drainable fluid collection. 2. Prior constrained right total knee arthroplasty with increased lucency along the posterior aspect of the distal femoral stem, concerning for loosening. Electronically Signed   By: Titus Dubin M.D.   On: 05/30/2017 15:44     Scheduled Meds: . apixaban  5 mg Oral BID  . calcium carbonate  1,250 mg Oral BID WC  . collagenase   Topical Daily  . diltiazem  300 mg Oral Daily  . divalproex  250 mg Oral Daily   And  . divalproex  750 mg Oral QHS  . doxazosin  4 mg Oral Daily  . doxycycline  100 mg Oral Q12H  . ezetimibe  5 mg Oral QHS  . feeding supplement (ENSURE ENLIVE)  237 mL Oral TID BM  . feeding supplement (PRO-STAT SUGAR FREE 64)  30 mL Oral BID  . ferrous sulfate  325 mg Oral BID WC  . furosemide  20 mg Oral Q3 days  . gabapentin  300 mg Oral BID  . ipratropium-albuterol  3 mL Nebulization BID  . levothyroxine  150 mcg Oral QAC breakfast  . mouth rinse  15 mL Mouth Rinse BID  . metoprolol tartrate  25 mg Oral BID  . pantoprazole  40 mg Oral BID  . pravastatin  40 mg Oral QHS  . predniSONE  10 mg Oral Q breakfast  . zinc sulfate  220 mg Oral q morning - 10a   Continuous Infusions:    LOS: 7 days   Time Spent in minutes   30 minutes  Keary Hanak D.O. on 05/31/2017 at 11:04 AM  Between 7am to 7pm - Pager - [phone]  After 7pm go to [email or web address] - password [email or web address]  And look for the night coverage person covering for me after hours  [facility] Office  [phone]

## 2017-05-31 NOTE — Care Management Note (Addendum)
Case Management Note  Patient Details  Name: [name] MRN: [id number] Date of Birth: 12/25/1933  Subjective/Objective:                  Admitted with AMS/acute encephalopathy/sepsis/Afib with RVR. Hx of HTN, HLD, diastolic CHF,atrial fibrillation on chronic anticoagulation,seizures,hypothyroidism, dementia, chronic lower extremity wound, andGERD. [facility]  OSSIE BELTRAN (Spouse)     [phone]      PCP: [name]   Action/Plan: Transition to home with wife with home health services ([email or web address]), and wife has committed to privately pay for 24/7 nurse aide service initially once pt discharges. Nurse will need to call and arranged transportation to home with [facility], paperwork inside of chart.  Expected Discharge Date:   06/01/2017           Expected Discharge Plan:  [place]  In-House Referral:  Clinical Social Work  Discharge planning Services  CM Consult  Post Acute Care Choice:  Home Health Choice offered to:  Spouse, Patient  DME Arranged:  Wheelchair manual(HOYER LIFT) DME Agency:  [place]:  RN, PT, OT [facility] Agency:  [facility]([email or web address])  Status of Service:  Completed, signed off  If discussed at [place] of Stay Meetings, dates discussed:    Additional Comments:  Sharin Mons, RN 05/31/2017, 2:14 PM

## 2017-05-31 NOTE — Care Management Important Message (Signed)
Important Message  Patient Details  Name: [name] MRN: [id number] Date of Birth: 1933/04/19   Medicare Important Message Given:  Yes    Carles Collet, RN 05/31/2017, 3:32 PM

## 2017-06-01 LAB — BASIC METABOLIC PANEL
Anion gap: 10 (ref 5–15)
BUN: 34 mg/dL — ABNORMAL HIGH (ref 6–20)
CO2: 28 mmol/L (ref 22–32)
Calcium: 8.1 mg/dL — ABNORMAL LOW (ref 8.9–10.3)
Chloride: 101 mmol/L (ref 101–111)
Creatinine, Ser: 1.24 mg/dL (ref 0.61–1.24)
GFR calc Af Amer: >60 mL/min (ref >60)
GFR calc non Af Amer: 53 mL/min — ABNORMAL LOW (ref >60)
Glucose, Bld: 75 mg/dL (ref 65–99)
Potassium: 4 mmol/L (ref 3.5–5.1)
Sodium: 139 mmol/L (ref 135–145)

## 2017-06-01 LAB — MAGNESIUM: Magnesium: 1.8 mg/dL (ref 1.7–2.4)

## 2017-06-01 MED ORDER — CALCIUM CARBONATE 1250 (500 CA) MG PO TABS: 1250.0000 mg | ORAL_TABLET | Freq: Two times a day (BID) | ORAL | 0 refills | Status: DC

## 2017-06-01 MED ORDER — FUROSEMIDE 20 MG PO TABS: 20.0000 mg | ORAL_TABLET | ORAL | 0 refills | Status: DC

## 2017-06-01 MED ORDER — MAGNESIUM SULFATE 2 GM/50ML IV SOLN
2.0000 g | Freq: Once | INTRAVENOUS | Status: AC
  Administered 2017-06-01: 2 g via INTRAVENOUS
  Filled 2017-06-01: qty 50

## 2017-06-01 MED ORDER — LIDOCAINE 5 % EX PTCH: 1.0000 | MEDICATED_PATCH | Freq: Every day | CUTANEOUS | 0 refills | Status: DC | PRN

## 2017-06-01 MED ORDER — METOPROLOL TARTRATE 25 MG PO TABS: 25.0000 mg | ORAL_TABLET | Freq: Two times a day (BID) | ORAL | 0 refills | Status: DC

## 2017-06-01 MED ORDER — COLLAGENASE 250 UNIT/GM EX OINT: TOPICAL_OINTMENT | Freq: Every day | CUTANEOUS | 0 refills | Status: DC

## 2017-06-01 MED ORDER — FERROUS SULFATE 325 (65 FE) MG PO TABS: 325.0000 mg | ORAL_TABLET | Freq: Two times a day (BID) | ORAL | 0 refills | Status: DC

## 2017-06-01 MED ORDER — ZINC SULFATE 220 (50 ZN) MG PO CAPS: 220.0000 mg | ORAL_CAPSULE | Freq: Every morning | ORAL | 0 refills | Status: DC

## 2017-06-01 MED ORDER — ENSURE ENLIVE PO LIQD: 237.0000 mL | Freq: Three times a day (TID) | ORAL | 0 refills | Status: DC

## 2017-06-01 MED ORDER — DOXYCYCLINE HYCLATE 100 MG PO TABS: 100.0000 mg | ORAL_TABLET | Freq: Two times a day (BID) | ORAL | 0 refills | Status: DC

## 2017-06-01 NOTE — Discharge Summary (Signed)
Physician Discharge Summary  JOBIE POPP LZJ:[id number] DOB: 1933/06/03 DOA: 05/23/2017  PCP: [name], MD  Admit date: 05/23/2017 Discharge date: 06/01/2017   Time spent: 45 minutes  Recommendations for Outpatient Follow-up:  Patient will be discharged to home with home health services through [facility].  Patient will need to follow up with primary care provider within one week of discharge, repeat BMP.  Patient should continue medications as prescribed.  Patient should follow a regular diet.   Discharge Diagnoses:  Sepsis secondary to pneumonia versus wound infection Acute hypoxic respiratory failure Chest pain/mildly elevated troponin Acute kidney injury Chronic diastolic heart failure Iron deficiency anemia Chronic lower extremity wounds Persistent atrial fibrillation Acute metabolic encephalopathy Essential hypertension Metabolic acidosis Diarrhea Dyslipidemia Hypokalemia BPH Hypothyroidism GERD Seizure disorder Goals of care Physical deconditioning History of knee osteoarthritis   Discharge Condition: Stable  Diet recommendation: regular  Filed Weights   05/30/17 0520 05/31/17 0401 06/01/17 0414  Weight: 85.5 kg (188 lb 7.9 oz) 81 kg (178 lb 9.2 oz) 76.6 kg (168 lb 14 oz)    History of present illness:  on 05/24/2017 by Dr. Jennye Boroughs Dowdyis a 84 y.o.malewith medical history significant ofHTN, HLD, diastolic CHF,atrial fibrillation on chronic anticoagulation,seizures,hypothyroidism, dementia, chronic lower extremity wound, andGERD;who presents from rehab after being found to have fever with altered mental status. History is obtained from the patient and wife and review of records as he is unable to give his own due to being acutely altered. He had recently been hospitalizedfrom 2/6-2/14for sepsis secondary to pneumonia and/or chronic leg wounds treated with Zithromax and Rocephin. At discharge patient was sent to  rehab,and had perhiswife been improving walking with a rolling walker with planneddischarge home sometime next week. However, around lunchtime patient was noted to be not responding like normally witha temperature of up to 101 F. He was given Tylenol prior to EMS arrival. She notes that he has had a mild intermittent cough. Denies any recent seizure activity to her knowledge. Reports that while he was at the facility though that they have been giving him more difficult than he normally takes at home.  Hospital Course:  Sepsis secondary to pneumonia versus wound infection -Upon admission, patient was febrile with tachycardia, tachypnea -Chest x-ray reviewed and showed infiltrates on the left -was started on vancomycin and cefepime- will discontinue vancomycin today -Blood cultures showed no growth to date -Patient does have chronic lower extremity wounds.  MRI ordered however patient refused on 05/28/2017.   -CT lower extremities ordered and unremarkable for osteomyelitis.  -patient had wound culture prior to admission-05/21/17, which showed MRSA -placed on doxycycline  Acute hypoxic respiratory failure -Patient was given 1 dose of Solu-Medrol -Suspect secondary to pneumonia -Chest x-ray as above  -Continue nebulizer treatments -maintaining O2 saturations in the high 90s on room air  Chest pain/mildly elevated troponin -Currently denies chest pain at this time -EKG showed no significant changes -Troponin relatively flat (0.06-0.08) -Cardiology consulted and appreciated, recommended no further workup  Acute kidney injury -Creatinine improved today, 1.2 -was 1.41 on admission however had improved to baseline of 1 -Cardiology adjusted lasix dose on 3/20 -Repeat BMP in one week  Chronic diastolic heart failure -Echocardiogram 04/18/2017 showed EF of 60-65% -Currently does not appear to be volume overloaded -As above, Lasix dose changed due to acute kidney injury- 20mg   q3days -Monitor intake and output, daily weights  Iron deficiency anemia -Continue iron supplementation -Hemoglobin 9.5 (baseline 9-10)  Chronic lower extremity wounds -Patient  had ABI done recently which was unremarkable -CT showed no evidence of osteo  Persistent atrial fibrillation -Cardiology consulted and appreciated-recommended continuing current medications -Continue Cardizem, Eliquis -HR controlled  Acute metabolic encephalopathy -Appears to be resolved at this time- wife at bedside states patient is not confused -Possibly related to infection versus medications  Essential hypertension -Continue Lasix, Cardura, diltiazem, metoprolol  Metabolic acidosis -Sodium bicarb held -Resolved  Diarrhea -Laxatives held -Continue to monitor  Dyslipidemia -Continue statin, Zetia  Hypokalemia -Resolved, repeat BMP in one week  BPH -Continue Cardura  Hypothyroidism -Continue Synthroid  GERD -Continue PPI  Seizure disorder -Continue Depakote  Goals of care -Palliative care consulted and appreciated to discuss [place] with patient's wife- palliative will meet with patient's wife today to review living will documents  Physical deconditioning -PT consulted, recommended SNF -Discussed with social work, patient may not have SNF days left.  He was recently at [facility]. -patient will be discharged home with home health services, hospital bed, hoyer lift, wheelchair, [place] RN, PT, OT -case management arranged  History of knee osteoarthritis  -s/p B/L arthroplasty by Dr. Durward Fortes -CT RLE did show Prior constrained right total knee arthroplasty with increased lucency along the posterior aspect of the distal femoral stem, concerning for loosening. -Have placed a call to Dr. Durward Fortes, did not receive a call back -suspect this can be followed as an outpatient   Consultants Cardiology Palliative care  Procedures  None  Discharge  Exam: Vitals:   05/31/17 2040 05/31/17 2136  BP:  125/69  Pulse:  86  Resp:  17  Temp:  97.7 F (36.5 C)  SpO2: 98% 96%   Patient seen and examined today. Denies chest pain, shortness of breath, abdominal pain, N/V. Feeling better today.    General: Well developed, chronically ill appearing, NAD  HEENT: NCAT, mucous membranes moist.  Cardiovascular: S1 S2 auscultated, irregular  Respiratory: Clear to auscultation bilaterally  Abdomen: Soft, nontender, nondistended, + bowel sounds  Extremities: warm dry without cyanosis clubbing or edema  Discharge Instructions Discharge Instructions    Discharge instructions   Complete by:  As directed    Patient will be discharged to home with home health services [facility].  Patient will need to follow up with primary care provider within one week of discharge, repeat BMP.  Patient should continue medications as prescribed.  Patient should follow a regular diet.     Allergies as of 06/01/2017      Reactions   Demerol [meperidine] Nausea And Vomiting   Keppra [levetiracetam] Other (See Comments)   Causes sleepiness      Medication List    STOP taking these medications   metoprolol succinate 100 MG 24 hr tablet Commonly known as:  TOPROL-XL   nutrition supplement (JUVEN) Pack Replaced by:  feeding supplement (ENSURE ENLIVE) Liqd   QUEtiapine 25 MG tablet Commonly known as:  SEROQUEL   zinc gluconate 50 MG tablet Replaced by:  zinc sulfate 220 (50 Zn) MG capsule     TAKE these medications   acetaminophen 325 MG tablet Commonly known as:  TYLENOL Take 650 mg by mouth every 4 (four) hours as needed for mild pain.   alendronate 70 MG tablet Commonly known as:  FOSAMAX Take 70 mg by mouth once a week.   apixaban 5 MG Tabs tablet Commonly known as:  ELIQUIS Take 1 tablet (5 mg total) by mouth 2 (two) times daily.   calcium carbonate 1250 (500 Ca) MG tablet Commonly known  as:  OS-CAL - dosed in mg of  elemental calcium Take 1 tablet (1,250 mg total) by mouth 2 (two) times daily with a meal. What changed:    medication strength  how much to take   collagenase ointment Commonly known as:  SANTYL Apply topically daily. Start taking on:  06/02/2017   CoQ10 200 MG Caps Take 200 mg by mouth at bedtime.   cyanocobalamin 500 MCG tablet Take 500 mcg by mouth at bedtime.   diltiazem 300 MG 24 hr capsule Commonly known as:  CARDIZEM CD Take 1 capsule (300 mg total) by mouth daily.   divalproex 250 MG DR tablet Commonly known as:  DEPAKOTE Take 3 tablets (750 mg total) by mouth every 12 (twelve) hours. What changed:  when to take this   docusate sodium 100 MG capsule Commonly known as:  COLACE Take 200 mg by mouth 2 (two) times daily.   doxazosin 4 MG tablet Commonly known as:  CARDURA Take 1 tablet (4 mg total) by mouth daily.   doxycycline 100 MG tablet Commonly known as:  VIBRA-TABS Take 1 tablet (100 mg total) by mouth every 12 (twelve) hours.   feeding supplement (ENSURE ENLIVE) Liqd Take 237 mLs by mouth 3 (three) times daily between meals. Replaces:  nutrition supplement (JUVEN) Pack   feeding supplement (PRO-STAT SUGAR FREE 64) Liqd Take 30 mLs by mouth 2 (two) times daily.   ferrous sulfate 325 (65 FE) MG tablet Take 1 tablet (325 mg total) by mouth 2 (two) times daily with a meal.   furosemide 20 MG tablet Commonly known as:  LASIX Take 1 tablet (20 mg total) by mouth every 3 (three) days. Start taking on:  06/03/2017 What changed:    medication strength  how much to take  when to take this  additional instructions   gabapentin 300 MG capsule Commonly known as:  NEURONTIN Take 300 mg by mouth 2 (two) times daily.   HYDROcodone-acetaminophen 10-325 MG tablet Commonly known as:  NORCO Take 1 tablet by mouth every 6 (six) hours as needed for moderate pain. What changed:  how much to take   levothyroxine 150 MCG tablet Commonly known as:   SYNTHROID, LEVOTHROID Take 150 mcg by mouth daily before breakfast.   lidocaine 5 % Commonly known as:  LIDODERM Place 1 patch onto the skin daily as needed. Remove & Discard patch within 12 hours or as directed by MD What changed:    when to take this  reasons to take this   metoprolol tartrate 25 MG tablet Commonly known as:  LOPRESSOR Take 1 tablet (25 mg total) by mouth 2 (two) times daily.   multivitamin with minerals Tabs tablet Take 1 tablet by mouth at bedtime.   nitroGLYCERIN 0.4 MG SL tablet Commonly known as:  NITROSTAT Place 0.4 mg under the tongue every 5 (five) minutes as needed for chest pain. Reported on 07/20/2015   omeprazole 20 MG capsule Commonly known as:  PRILOSEC Take 20 mg by mouth 2 (two) times daily before a meal.   polyethylene glycol packet Commonly known as:  MIRALAX / GLYCOLAX Take 17 g by mouth daily as needed for moderate constipation.   pravastatin 40 MG tablet Commonly known as:  PRAVACHOL Take 40 mg by mouth at bedtime.   predniSONE 10 MG tablet Commonly known as:  DELTASONE Take 1 tablet (10 mg total) by mouth daily with breakfast.   senna 8.6 MG tablet Commonly known as:  SENOKOT Take 1 tablet by  mouth 2 (two) times daily.   vitamin A 8000 UNIT capsule Take 8,000 Units by mouth 2 (two) times daily.   vitamin C 500 MG tablet Commonly known as:  ASCORBIC ACID Take 500 mg by mouth every morning.   Vitamin D-3 1000 units Caps Take 1,000 Units by mouth 2 (two) times daily.   ZETIA 10 MG tablet Generic drug:  ezetimibe Take 5 mg by mouth at bedtime.   zinc sulfate 220 (50 Zn) MG capsule Take 1 capsule (220 mg total) by mouth every morning. Start taking on:  06/02/2017 Replaces:  zinc gluconate 50 MG tablet            Durable Medical Equipment  (From admission, onward)        Start     Ordered   05/31/17 1108  For home use only DME Hospital bed  Once    Comments:  Pt requires maxA and is unable to ambulate at this  time. Pt with severely impaired processing limiting ability to follow commands and transfer to chair.  Question Answer Comment  Patient has (list medical condition): hx of HTN, HLD, diastolic CHF, atrial fibrillation on chronic anticoagulation, seizures, hypothyroidism, dementia, chronic lower extremity wound, and GERD   Bed type Semi-electric   Hoyer Lift Yes      05/31/17 1111   05/31/17 1107  For home use only DME standard manual wheelchair with seat cushion  Once    Comments:  Patient suffers from physical deconditioning which impairs their ability to perform daily activities in the home.  A walker will not resolve this issue with performing activities of daily living. A wheelchair will allow patient to safely perform daily activities. Patient can safely propel the wheelchair in the home or has a caregiver who can provide assistance.  Accessories: elevating leg rests (ELRs), wheel locks, extensions and anti-tippers.   05/31/17 1107     Allergies  Allergen Reactions  . Demerol [Meperidine] Nausea And Vomiting  . Keppra [Levetiracetam] Other (See Comments)    Causes sleepiness   Follow-up Information    [facility] Follow up.   Specialty:  Home Health Services Why:  home health services arranged Contact information: [address] [place] [address] [phone]        [facility] - Dme Follow up.   Why:  wheelchair to be delivered to bedside prior to d/c Contact information: [address] [place] [address] [phone]        [name], MD. Schedule an appointment as soon as possible for a visit in 1 week(s).   Specialty:  Internal Medicine Why:  Hospital follow up Contact information: [place] [address] [phone]        Skeet Latch, MD .   Specialty:  Cardiology Contact information: [address] [place] [address] [phone]        Garald Balding,  MD. Schedule an appointment as soon as possible for a visit in 2 week(s).   Specialty:  Orthopedic Surgery Why:  Hospital follow up Contact information: [address] [place] [address] [phone]            The results of significant diagnostics from this hospitalization (including imaging, microbiology, ancillary and laboratory) are listed below for reference.    Significant Diagnostic Studies: Dg Chest 2 View  Result Date: 05/24/2017 CLINICAL DATA:  84 year old male with fever.  Pneumonia. EXAM: CHEST - 2 VIEW COMPARISON:  Chest radiograph dated 04/23/2017 FINDINGS: There is shallow inspiration. Left lung base density may represent atelectasis versus infiltrate. Clinical correlation is recommended. Small left pleural effusion may be present. Interval clearing of the right lung base densities compared to prior radiograph. There is no pneumothorax. Stable cardiomegaly. No acute osseous pathology. IMPRESSION: Left lung base atelectasis versus infiltrate. Interval clearing of the right lung base densities compared to prior radiograph. Electronically Signed   By: Anner Crete M.D.   On: 05/24/2017 00:03   Dg Ankle Complete Right  Result Date: 05/24/2017 CLINICAL DATA:  Chronic right ankle wound.  Fever. EXAM: RIGHT ANKLE - COMPLETE 3+ VIEW COMPARISON:  Right ankle radiographs performed 04/09/2017 FINDINGS: There is no evidence of fracture or dislocation. No osseous erosions are seen. The ankle mortise is intact; the interosseous space is within normal limits. No talar tilt or subluxation is seen. Calcification is noted along the plantar fascia. A plantar calcaneal spur is noted. Scattered vascular calcifications are seen. The joint spaces are preserved. Soft tissue air is noted along the medial aspect of the lower leg, reflecting a soft tissue ulceration. No radiopaque foreign bodies are seen. IMPRESSION: 1. No osseous erosions seen. 2. No radiopaque foreign bodies seen. 3.  Scattered vascular calcifications noted. Electronically Signed   By: Garald Balding M.D.   On: 05/24/2017 00:07   Dg Chest Port 1 View  Result Date: 05/27/2017 CLINICAL DATA:  Chest pain and worsening shortness of breath. EXAM: PORTABLE CHEST 1 VIEW COMPARISON:  Chest x-ray dated May 23, 2017. FINDINGS: Stable cardiomegaly. Persistent low lung volumes and small left pleural effusion. Apparent new hazy density in the right mid lung is likely secondary to overlapping scapula. Unchanged patchy density at the left lung base. No pneumothorax. No acute osseous abnormality. Old right-sided rib fractures. IMPRESSION: Unchanged left lower lobe atelectasis versus infiltrate and small left pleural effusion. Electronically Signed   By: Titus Dubin M.D.   On: 05/27/2017 11:23   Dg Foot Complete Left  Result Date: 05/24/2017 CLINICAL DATA:  Acute onset of lethargy and fever. Chronic left great toe wound. EXAM: LEFT FOOT - COMPLETE 3+ VIEW COMPARISON:  Left foot radiographs performed 06/13/2016 FINDINGS: There is no evidence of fracture or dislocation. No osseous erosions are seen. The joint spaces are preserved. There is no evidence of talar subluxation; the subtalar joint is unremarkable in appearance. Plantar and posterior calcaneal spurs are seen. The left great toe wound is not well characterized. No radiopaque foreign bodies are identified. IMPRESSION: No evidence of fracture or dislocation.  No osseous erosions seen. Electronically Signed   By: Garald Balding M.D.   On: 05/24/2017 00:03   Ct Extremity Lower Left W Contrast  Result Date: 05/30/2017 CLINICAL DATA:  Chronic bilateral lower extremity wounds. Evaluate for osteomyelitis. Patient is refusing MRI. EXAM: CT OF THE LOWER LEFT EXTREMITY WITH CONTRAST TECHNIQUE: Multidetector CT imaging of the lower left leg was performed according to the standard protocol following intravenous contrast administration. COMPARISON:  Multiple prior lower extremity  x-rays. CONTRAST:  162mL ISOVUE-300 IOPAMIDOL (ISOVUE-300) INJECTION 61% FINDINGS: Bones/Joint/Cartilage Prior left total knee arthroplasty. No evidence of hardware failure or loosening. No acute fracture or dislocation. No cortical destruction or osteolysis. The talar dome is intact. The ankle mortise is symmetric. No tibiotalar joint effusion. Small knee joint effusion and/or synovitis. Mild osteoarthritis of the second tarsometatarsal joint. Ligaments Suboptimally assessed by CT. Muscles and Tendons Mild atrophy of the distal semimembranosus muscle and muscles of the lower leg. The visualized  tendons are grossly intact. Soft tissues No fluid collection.  Vascular calcifications. IMPRESSION: 1. No CT evidence of osteomyelitis. No soft tissue fluid collection. Electronically Signed   By: Titus Dubin M.D.   On: 05/30/2017 15:32   Ct Extremity Lower Right W Contrast  Result Date: 05/30/2017 CLINICAL DATA:  Chronic lower extremity wounds. Evaluate for osteomyelitis. Patient is refusing MRI. EXAM: CT OF THE LOWER RIGHT EXTREMITY WITH CONTRAST TECHNIQUE: Multidetector CT imaging of the lower right leg was performed according to the standard protocol following intravenous contrast administration. COMPARISON:  Multiple prior lower extremity x-rays. CONTRAST:  147mL ISOVUE-300 IOPAMIDOL (ISOVUE-300) INJECTION 61% FINDINGS: Bones/Joint/Cartilage Prior constrained right total knee arthroplasty. There is some increased lucency along the posterior aspect of the distal femoral stem. Minimal lucency along the tibial stem measures less than 2 mm and is likely within normal limits. No acute fracture or dislocation. No cortical destruction or osteolysis. The talar dome is intact. The ankle mortise is symmetric. No tibiotalar joint effusion. Small knee joint effusion. Moderate osteoarthritis of the naviculocuneiform joint. Mild first and second tarsometatarsal joint osteoarthritis. Ligaments Suboptimally assessed by CT.  Muscles and Tendons Prominent atrophy of the distal semimembranosus muscle, and peroneal and flexor muscles in the lower leg. Mild fatty atrophy of the soleus muscle. The visualized tendons are grossly intact. Soft tissues No fluid collection.  Mild subcutaneous edema over the dorsal foot. IMPRESSION: 1. No CT evidence of osteomyelitis.  No drainable fluid collection. 2. Prior constrained right total knee arthroplasty with increased lucency along the posterior aspect of the distal femoral stem, concerning for loosening. Electronically Signed   By: Titus Dubin M.D.   On: 05/30/2017 15:44    Microbiology: Recent Results (from the past 240 hour(s))  Culture, blood (Routine x 2)     Status: None   Collection Time: 05/23/17  9:45 PM  Result Value Ref Range Status   Specimen Description BLOOD RIGHT FOREARM  Final   Special Requests   Final    BOTTLES DRAWN AEROBIC AND ANAEROBIC Blood Culture adequate volume   Culture   Final    NO GROWTH 5 DAYS Performed at [facility], [address], [place], [place] [address]    Report Status 05/29/2017 FINAL  Final  Culture, blood (Routine x 2)     Status: None   Collection Time: 05/23/17  9:49 PM  Result Value Ref Range Status   Specimen Description BLOOD LEFT ARM  Final   Special Requests   Final    BOTTLES DRAWN AEROBIC AND ANAEROBIC Blood Culture adequate volume   Culture   Final    NO GROWTH 5 DAYS Performed at [facility] [address] [place], [place] [address]    Report Status 05/29/2017 FINAL  Final  Urine Culture     Status: None   Collection Time: 05/24/17 12:14 AM  Result Value Ref Range Status   Specimen Description URINE, CATHETERIZED  Final   Special Requests NONE  Final   Culture   Final    NO GROWTH Performed at [facility], [place] [address], [place], [place] [address]    Report Status 05/25/2017 FINAL  Final  Respiratory Panel by PCR     Status: None   Collection Time: 05/24/17  4:56 AM  Result Value  Ref Range Status   Adenovirus NOT DETECTED NOT DETECTED Final   Coronavirus 229E NOT DETECTED NOT DETECTED Final   Coronavirus HKU1 NOT DETECTED NOT DETECTED Final  Coronavirus NL63 NOT DETECTED NOT DETECTED Final   Coronavirus OC43 NOT DETECTED NOT DETECTED Final   Metapneumovirus NOT DETECTED NOT DETECTED Final   Rhinovirus / Enterovirus NOT DETECTED NOT DETECTED Final   Influenza A NOT DETECTED NOT DETECTED Final   Influenza B NOT DETECTED NOT DETECTED Final   Parainfluenza Virus 1 NOT DETECTED NOT DETECTED Final   Parainfluenza Virus 2 NOT DETECTED NOT DETECTED Final   Parainfluenza Virus 3 NOT DETECTED NOT DETECTED Final   Parainfluenza Virus 4 NOT DETECTED NOT DETECTED Final   Respiratory Syncytial Virus NOT DETECTED NOT DETECTED Final   Bordetella pertussis NOT DETECTED NOT DETECTED Final   Chlamydophila pneumoniae NOT DETECTED NOT DETECTED Final   Mycoplasma pneumoniae NOT DETECTED NOT DETECTED Final    Comment: Performed at [facility], [place] [address] [place], [place] [address]  MRSA PCR Screening     Status: None   Collection Time: 05/28/17  9:40 AM  Result Value Ref Range Status   MRSA by PCR NEGATIVE NEGATIVE Final    Comment:        The GeneXpert MRSA Assay (FDA approved for NASAL specimens only), is one component of a comprehensive MRSA colonization surveillance program. It is not intended to diagnose MRSA infection nor to guide or monitor treatment for MRSA infections. Performed at [facility], [place] [address] [place], [place] [address]      Labs: Basic Metabolic Panel: Recent Labs  Lab 05/26/17 [id number]  05/28/17 1008 05/28/17 1455 05/29/17 5277 05/30/17 0722 05/31/17 0425 06/01/17 0526 06/01/17 0706  NA  --    < > 142  --  142 141 139 139  --   K  --    < > 4.0  --  3.9 3.7 3.6 4.0  --   CL  --    < > 111  --  107 107 103 101  --   CO2  --    < > 21*  --  25 26 27 28   --   GLUCOSE  --    < > 135*  --  87 81 82 75  --   BUN  --    < >  29*  --  38* 41* 34* 34*  --   CREATININE  --    < > 1.08  --  1.27* 1.14 1.12 1.24  --   CALCIUM  --    < > 7.8*  --  7.9* 8.0* 8.0* 8.1*  --   MG 1.9  --   --  1.9  --   --  1.9  --  1.8   < > = values in this interval not displayed.   Liver Function Tests: No results for input(s): AST, ALT, ALKPHOS, BILITOT, PROT, ALBUMIN in the last 168 hours. No results for input(s): LIPASE, AMYLASE in the last 168 hours. No results for input(s): AMMONIA in the last 168 hours. CBC: Recent Labs  Lab 05/26/17 0809 05/26/17 1344 05/27/17 0824 05/28/17 1008 05/29/17 0639 05/31/17 0425  WBC 8.0  --  8.3 8.2 8.3 9.8  HGB 8.1* 8.2* 8.8* 8.9* 8.7* 9.5*  HCT 26.6* 26.6* 28.3* 29.0* 29.0* 30.1*  MCV 102.3*  --  101.8* 99.7 100.3* 100.0  PLT 126*  --  136* 148* 153 172   Cardiac Enzymes: Recent Labs  Lab 05/26/17 1344 05/26/17 1927 05/27/17 0225  TROPONINI 0.07* 0.06* 0.08*   BNP: BNP (last 3 results) No results for input(s): BNP in the last  8760 hours.  ProBNP (last 3 results) No results for input(s): PROBNP in the last 8760 hours.  CBG: Recent Labs  Lab 05/26/17 1308 05/26/17 2057  GLUCAP 98 116*       Signed:  Kionte Baumgardner  [facility] 06/01/2017, 10:25 AM

## 2017-06-01 NOTE — Progress Notes (Signed)
Discharge teaching given to patient with spouse, including activity, diet, follow-up appoints, and medications. Patient and wife verbalized understanding of all discharge instructions. IV access was d/c'd. Vitals are stable. Skin is intact except as charted in most recent assessments. Patient received all DME. [facility] called this morning to pick patient up at 4p, when home health can arrive to patient's home per wife's request.

## 2017-06-01 NOTE — Discharge Instructions (Signed)

## 2017-06-11 ENCOUNTER — Encounter (HOSPITAL_BASED_OUTPATIENT_CLINIC_OR_DEPARTMENT_OTHER): Payer: Medicare Other | Attending: Internal Medicine

## 2017-06-11 DIAGNOSIS — S51012A Laceration without foreign body of left elbow, initial encounter: Secondary | ICD-10-CM | POA: Diagnosis not present

## 2017-06-11 DIAGNOSIS — I10 Essential (primary) hypertension: Secondary | ICD-10-CM | POA: Diagnosis not present

## 2017-06-11 DIAGNOSIS — L97519 Non-pressure chronic ulcer of other part of right foot with unspecified severity: Secondary | ICD-10-CM | POA: Insufficient documentation

## 2017-06-11 DIAGNOSIS — X58XXXA Exposure to other specified factors, initial encounter: Secondary | ICD-10-CM | POA: Insufficient documentation

## 2017-06-11 DIAGNOSIS — Z7952 Long term (current) use of systemic steroids: Secondary | ICD-10-CM | POA: Diagnosis not present

## 2017-06-11 DIAGNOSIS — L97316 Non-pressure chronic ulcer of right ankle with bone involvement without evidence of necrosis: Secondary | ICD-10-CM | POA: Diagnosis not present

## 2017-06-11 DIAGNOSIS — I2510 Atherosclerotic heart disease of native coronary artery without angina pectoris: Secondary | ICD-10-CM | POA: Insufficient documentation

## 2017-06-11 DIAGNOSIS — L89894 Pressure ulcer of other site, stage 4: Secondary | ICD-10-CM | POA: Diagnosis present

## 2017-06-11 DIAGNOSIS — L97812 Non-pressure chronic ulcer of other part of right lower leg with fat layer exposed: Secondary | ICD-10-CM | POA: Insufficient documentation

## 2017-06-11 DIAGNOSIS — G4730 Sleep apnea, unspecified: Secondary | ICD-10-CM | POA: Insufficient documentation

## 2017-06-21 ENCOUNTER — Encounter: Payer: Self-pay | Admitting: Cardiovascular Disease

## 2017-06-21 ENCOUNTER — Ambulatory Visit (INDEPENDENT_AMBULATORY_CARE_PROVIDER_SITE_OTHER): Payer: Medicare Other | Admitting: Cardiovascular Disease

## 2017-06-21 VITALS — BP 110/60 | HR 76 | Ht 70.0 in | Wt 189.0 lb

## 2017-06-21 DIAGNOSIS — I5032 Chronic diastolic (congestive) heart failure: Secondary | ICD-10-CM | POA: Diagnosis not present

## 2017-06-21 DIAGNOSIS — I4819 Other persistent atrial fibrillation: Secondary | ICD-10-CM

## 2017-06-21 DIAGNOSIS — Z5181 Encounter for therapeutic drug level monitoring: Secondary | ICD-10-CM | POA: Diagnosis not present

## 2017-06-21 DIAGNOSIS — E7800 Pure hypercholesterolemia, unspecified: Secondary | ICD-10-CM | POA: Diagnosis not present

## 2017-06-21 DIAGNOSIS — I481 Persistent atrial fibrillation: Secondary | ICD-10-CM | POA: Diagnosis not present

## 2017-06-21 DIAGNOSIS — I2510 Atherosclerotic heart disease of native coronary artery without angina pectoris: Secondary | ICD-10-CM

## 2017-06-21 MED ORDER — FUROSEMIDE 20 MG PO TABS: 20.0000 mg | ORAL_TABLET | Freq: Every day | ORAL | 3 refills | Status: DC

## 2017-06-21 NOTE — Progress Notes (Signed)
Cardiology Office Note   Date:  06/29/2017   ID:  [name], DOB [birth year], MRN [id number]  PCP:  [name], MD  Cardiologist:   Skeet Latch, MD   Chief Complaint  Patient presents with  . Follow-up  . Edema    Feet and legs.      History of Present Illness: [name] is a 84 y.o. male  with CAD s/p RCA PCI, hypertension, hyperlipidemia, CKD III, dementia, seizure disorder and hypothyroidism who presents for follow up.  He was first seen in the hospital 04/2017 with atrial fibrillation in the setting of pneumonia, chronic leg wounds, and sepsis.  Echo that admission revealed LVEF 60-65% with moderate to severe left atrial enlargement.  Started on Eliquis.  Troponin was elevated to 0.49 but this was felt to be due to demand ischemia in the setting of sepsis and atrial fibrillation with rapid ventricular response.  He was again hospitalized 05/2017 with sepsis secondary to pneumonia.  Reported chest pain and troponin was mildly elevated to 0.08 and flat.  Cardiology was consulted and no further workup was recommended at that time.  Since discharge from the hospital Dr. [name] has been doing better.  He is gradually getting better.  He has started walking with a walker.  He has no chest pain or pressure.  Lately he has had more lower extremity edema and his weight is   He denies orthopnea or PND.  His wife worries that he coughs when he eats and thinks that aspiration may be what caused his initial pneumonia.     Past Medical History:  Diagnosis Date  . Abnormality of gait 09/22/2015  . Arthritis   . Atrial fibrillation ([place])   . CAD (coronary artery disease)    Stent to RCA, Penta stent, 99% reduced to 0% 2002.  . Cancer ([place])    skin CA removed from back  . Chronic insomnia 03/30/2015  . Complication of anesthesia    trouble waking up  . Constipation    from medications taken  . GERD (gastroesophageal reflux disease)   . Hypercholesteremia   . Hypertension    . Hypothyroidism   . Memory difficulty 09/22/2015  . Osteoarthritis   . Seizures ([place])   . Sepsis ([place]) 05/2017  . Transient alteration of awareness 03/30/2015  . Vertigo    hx of    Past Surgical History:  Procedure Laterality Date  . BACK SURGERY    . CERVIX SURGERY    . EYE SURGERY     Bilateral Cataract surgery   . HERNIA REPAIR    . I&D EXTREMITY Right 05/10/2015   Procedure: IRRIGATION AND DEBRIDEMENT EXTREMITY;  Surgeon: Leanora Cover, MD;  Location: [facility];  Service: Orthopedics;  Laterality: Right;  . KNEE ARTHROPLASTY     right knee X 2; left knee once  . LAMINECTOMY     X 6  . POSTERIOR CERVICAL FUSION/FORAMINOTOMY  01/28/2012   Procedure: POSTERIOR CERVICAL FUSION/FORAMINOTOMY LEVEL 3;  Surgeon: [name], MD;  Location: [facility];  Service: Neurosurgery;  Laterality: Left;  Posterior Cervical Five-Thoracic One Fusion, Arthrodesis with LEFT Cervical Seven-thoracic One Laminectomy, Foraminotomy and Resection of Synovial Cyst  . POSTERIOR CERVICAL FUSION/FORAMINOTOMY N/A 01/29/2013   Procedure: POSTERIOR CERVICAL FUSION/FORAMINOTOMY LEVEL 1 and C2-5 Posteriolateral Arthrodesis;  Surgeon: [name], MD;  Location: [facility];  Service: Neurosurgery;  Laterality: N/A;  C2-C3 Laminectomy C2-C3 posterior cervical arthrodesis  . TONSILLECTOMY  Current Outpatient Medications  Medication Sig Dispense Refill  . acetaminophen (TYLENOL) 325 MG tablet Take 650 mg by mouth every 4 (four) hours as needed for mild pain.    Marland Kitchen alendronate (FOSAMAX) 70 MG tablet Take 70 mg by mouth once a week.     . Amino Acids-Protein Hydrolys (FEEDING SUPPLEMENT, PRO-STAT SUGAR FREE 64,) LIQD Take 30 mLs by mouth 2 (two) times daily.    . calcium carbonate (OS-CAL - DOSED IN MG OF ELEMENTAL CALCIUM) 1250 (500 Ca) MG tablet Take 1 tablet (1,250 mg total) by mouth 2 (two) times daily with a meal. 60 tablet 0  . Cholecalciferol (VITAMIN D-3) 1000 units CAPS  Take 1,000 Units by mouth 2 (two) times daily.    . Coenzyme Q10 (COQ10) 200 MG CAPS Take 200 mg by mouth at bedtime.    . collagenase (SANTYL) ointment Apply topically daily. 15 g 0  . cyanocobalamin 500 MCG tablet Take 500 mcg by mouth at bedtime.    . divalproex (DEPAKOTE) 250 MG DR tablet Take 3 tablets (750 mg total) by mouth every 12 (twelve) hours. (Patient taking differently: Take 750 mg by mouth 2 (two) times daily. ) 60 tablet 0  . docusate sodium (COLACE) 100 MG capsule Take 200 mg by mouth 2 (two) times daily.     . feeding supplement, ENSURE ENLIVE, (ENSURE ENLIVE) LIQD Take 237 mLs by mouth 3 (three) times daily between meals. 90 Bottle 0  . furosemide (LASIX) 20 MG tablet Take 1 tablet (20 mg total) by mouth daily. 90 tablet 3  . gabapentin (NEURONTIN) 300 MG capsule Take 300 mg by mouth 2 (two) times daily.    Marland Kitchen HYDROcodone-acetaminophen (NORCO) 10-325 MG tablet Take 1 tablet by mouth every 6 (six) hours as needed for moderate pain. (Patient taking differently: Take 0.5 tablets by mouth every 6 (six) hours as needed for moderate pain. ) 8 tablet 0  . levothyroxine (SYNTHROID, LEVOTHROID) 150 MCG tablet Take 150 mcg by mouth daily before breakfast.    . Multiple Vitamin (MULTIVITAMIN WITH MINERALS) TABS tablet Take 1 tablet by mouth at bedtime.    . nitroGLYCERIN (NITROSTAT) 0.4 MG SL tablet Place 0.4 mg under the tongue every 5 (five) minutes as needed for chest pain. Reported on 07/20/2015    . omeprazole (PRILOSEC) 20 MG capsule Take 20 mg by mouth 2 (two) times daily before a meal.     . pravastatin (PRAVACHOL) 40 MG tablet Take 40 mg by mouth at bedtime.   2  . predniSONE (DELTASONE) 10 MG tablet Take 1 tablet (10 mg total) by mouth daily with breakfast.    . senna (SENOKOT) 8.6 MG tablet Take 1 tablet by mouth 2 (two) times daily.     . vitamin A 8000 UNIT capsule Take 8,000 Units by mouth 2 (two) times daily.    . vitamin C (ASCORBIC ACID) 500 MG tablet Take 500 mg by mouth  every morning.    Marland Kitchen ZETIA 10 MG tablet Take 5 mg by mouth at bedtime.     Marland Kitchen zinc sulfate 220 (50 Zn) MG capsule Take 1 capsule (220 mg total) by mouth every morning. 30 capsule 0  . apixaban (ELIQUIS) 5 MG TABS tablet Take 1 tablet (5 mg total) by mouth 2 (two) times daily. 60 tablet 0  . diltiazem (CARDIZEM CD) 300 MG 24 hr capsule Take 1 capsule (300 mg total) by mouth daily. 30 capsule 0  . doxazosin (CARDURA) 4 MG tablet Take 1 tablet (  4 mg total) by mouth daily. 30 tablet 0  . ferrous sulfate 325 (65 FE) MG tablet Take 1 tablet (325 mg total) by mouth 2 (two) times daily with a meal. 60 tablet 0  . furosemide (LASIX) 20 MG tablet Take 1 tablet (20 mg total) by mouth daily. 30 tablet 0  . lidocaine (LIDODERM) 5 % Place 1 patch onto the skin daily as needed. Remove & Discard patch within 12 hours or as directed by MD (Patient not taking: Reported on 06/21/2017) 30 patch 0  . metoprolol tartrate (LOPRESSOR) 25 MG tablet Take 1 tablet (25 mg total) by mouth 2 (two) times daily. 60 tablet 0   No current facility-administered medications for this visit.     Allergies:   Demerol [meperidine] and Keppra [levetiracetam]    Social History:  The patient  reports that he has quit smoking. He has never used smokeless tobacco. He reports that he does not drink alcohol or use drugs.   Family History:  The patient's family history includes Cancer in his mother; Heart disease in his father; Hypertension in his mother; Kidney failure in his father.    ROS:  Please see the history of present illness.   Otherwise, review of systems are positive for none.   All other systems are reviewed and negative.    PHYSICAL EXAM: VS:  BP 110/60 (BP Location: Left Arm, Patient Position: Sitting, Cuff Size: Normal)   Pulse 76   Ht 5\' 10"  (1.778 m)   Wt 189 lb (85.7 kg)   BMI 27.12 kg/m  , BMI Body mass index is 27.12 kg/m. GENERAL:  Well appearing HEENT:  Pupils equal round and reactive, fundi not visualized,  oral mucosa unremarkable NECK:  + jugular venous distention, waveform within normal limits, carotid upstroke brisk and symmetric, no bruits LUNGS:  Clear to auscultation bilaterally HEART:  Irregularly irregular.  PMI not displaced or sustained,S1 and S2 within normal limits, no S3, no S4, no clicks, no rubs, no murmurs ABD:  Flat, positive bowel sounds normal in frequency in pitch, no bruits, no rebound, no guarding, no midline pulsatile mass, no hepatomegaly, no splenomegaly EXT:  2 plus pulses throughout.  1+ LE edema bilaterally , no cyanosis no clubbing SKIN:  No rashes no nodules NEURO:  Cranial nerves II through XII grossly intact, motor grossly intact throughout PSYCH:  Cognitively intact, oriented to person place and time    EKG:  EKG is not ordered today.  Echo 04/18/17: Study Conclusions  - Left ventricle: The cavity size was normal. Wall thickness was   increased in a pattern of moderate LVH. Systolic function was   normal. The estimated ejection fraction was in the range of 60%   to 65%. Wall motion was normal; there were no regional wall   motion abnormalities. - Mitral valve: There was mild regurgitation. - Left atrium: The atrium was moderately to severely dilated. - Right atrium: The atrium was moderately dilated. - Pulmonary arteries: Systolic pressure was mildly to moderately   increased. PA peak pressure: 37 mm Hg (S).  Recent Labs: 04/17/2017: TSH 1.782 05/23/2017: ALT 17 05/31/2017: Hemoglobin 9.5; Platelets 172 06/01/2017: Magnesium 1.8 06/28/2017: BUN 27; Creatinine, Ser 1.31; Potassium 4.0; Sodium 145    Lipid Panel No results found for: CHOL, TRIG, HDL, CHOLHDL, VLDL, LDLCALC, LDLDIRECT    Wt Readings from Last 3 Encounters:  06/21/17 189 lb (85.7 kg)  06/01/17 168 lb 14 oz (76.6 kg)  05/02/17 177 lb (80.3 kg)  ASSESSMENT AND PLAN:  # Acute on chronic diastolic heart failure: Dr. [name] is volume overloaded and his weight is up. Start lasix 20mg   po daily.  Continue metoprolol.    # CAD s/p PCI: Not an active issue.  Continue metoprolol, pravastatin and ezetimibe.  No aspirin given that he is on Eliquis.   # Paroxysmal atrial fibrillation: Occurred in the setting of pneumonia.  Continue metoprolol and Eliquis.  # Hypertension: Continue metoprolol and doxazosin.   Current medicines are reviewed at length with the patient today.  The patient does not have concerns regarding medicines.  The following changes have been made:  Start lasix.  Labs/ tests ordered today include:   Orders Placed This Encounter  Procedures  . Basic metabolic panel     Disposition:   FU with Dagen Beevers C. Oval Linsey, MD, [facility] in 1 month.     Signed, Wren Gallaga C. Oval Linsey, MD, [facility]  06/29/2017 2:50 PM    [facility]

## 2017-06-21 NOTE — Patient Instructions (Signed)
Medication Instructions:  Lasix 20 mg Daily  Labwork: Your physician recommends that you return for lab work in: 1 week (BMP)   Testing/Procedures: none  Follow-Up: Your physician recommends that you schedule a follow-up appointment in: 1 month with Dr. Oval Linsey   Any Other Special Instructions Will Be Listed Below (If Applicable).     If you need a refill on your cardiac medications before your next appointment, please call your pharmacy.

## 2017-06-25 DIAGNOSIS — L89894 Pressure ulcer of other site, stage 4: Secondary | ICD-10-CM | POA: Diagnosis not present

## 2017-06-28 ENCOUNTER — Telehealth: Payer: Self-pay

## 2017-06-28 ENCOUNTER — Other Ambulatory Visit: Payer: Self-pay

## 2017-06-28 LAB — BASIC METABOLIC PANEL
BUN/Creatinine Ratio: 21 (ref 10–24)
BUN: 27 mg/dL (ref 8–27)
CO2: 26 mmol/L (ref 20–29)
Calcium: 8.3 mg/dL — ABNORMAL LOW (ref 8.6–10.2)
Chloride: 106 mmol/L (ref 96–106)
Creatinine, Ser: 1.31 mg/dL — ABNORMAL HIGH (ref 0.76–1.27)
GFR calc Af Amer: 59 mL/min/{1.73_m2} — ABNORMAL LOW (ref >59)
GFR calc non Af Amer: 51 mL/min/{1.73_m2} — ABNORMAL LOW (ref >59)
Glucose: 98 mg/dL (ref 65–99)
Potassium: 4 mmol/L (ref 3.5–5.2)
Sodium: 145 mmol/L — ABNORMAL HIGH (ref 134–144)

## 2017-06-28 MED ORDER — METOPROLOL TARTRATE 25 MG PO TABS: 25.0000 mg | ORAL_TABLET | Freq: Two times a day (BID) | ORAL | 0 refills | Status: DC

## 2017-06-28 MED ORDER — FUROSEMIDE 20 MG PO TABS: 20.0000 mg | ORAL_TABLET | Freq: Every day | ORAL | 0 refills | Status: DC

## 2017-06-28 MED ORDER — DILTIAZEM HCL ER COATED BEADS 300 MG PO CP24: 300.0000 mg | ORAL_CAPSULE | Freq: Every day | ORAL | 0 refills | Status: DC

## 2017-06-28 MED ORDER — APIXABAN 5 MG PO TABS: 5.0000 mg | ORAL_TABLET | Freq: Two times a day (BID) | ORAL | 0 refills | Status: DC

## 2017-06-28 MED ORDER — FERROUS SULFATE 325 (65 FE) MG PO TABS: 325.0000 mg | ORAL_TABLET | Freq: Two times a day (BID) | ORAL | 0 refills | Status: DC

## 2017-06-28 MED ORDER — DOXAZOSIN MESYLATE 4 MG PO TABS: 4.0000 mg | ORAL_TABLET | Freq: Every day | ORAL | 0 refills | Status: DC

## 2017-06-28 NOTE — Telephone Encounter (Signed)
Patient and wife came to office for lab work.Wife stated husband needs refills on medications.Stated he does not have enough to last the weekend.Refills sent to pharmacy.

## 2017-06-29 ENCOUNTER — Encounter: Payer: Self-pay | Admitting: Cardiovascular Disease

## 2017-06-29 ENCOUNTER — Other Ambulatory Visit: Payer: Self-pay | Admitting: Internal Medicine

## 2017-07-01 ENCOUNTER — Other Ambulatory Visit: Payer: Self-pay

## 2017-07-01 ENCOUNTER — Encounter (HOSPITAL_COMMUNITY): Payer: Self-pay

## 2017-07-01 ENCOUNTER — Emergency Department (HOSPITAL_COMMUNITY): Payer: Medicare Other

## 2017-07-01 ENCOUNTER — Inpatient Hospital Stay (HOSPITAL_COMMUNITY)
Admission: EM | Admit: 2017-07-01 | Discharge: 2017-07-05 | DRG: 177 | Disposition: A | Discharge status: Home Health | Payer: Medicare Other | Attending: Internal Medicine | Admitting: Internal Medicine

## 2017-07-01 ENCOUNTER — Telehealth: Payer: Self-pay | Admitting: Cardiovascular Disease

## 2017-07-01 DIAGNOSIS — K219 Gastro-esophageal reflux disease without esophagitis: Secondary | ICD-10-CM | POA: Diagnosis present

## 2017-07-01 DIAGNOSIS — Z8249 Family history of ischemic heart disease and other diseases of the circulatory system: Secondary | ICD-10-CM

## 2017-07-01 DIAGNOSIS — R778 Other specified abnormalities of plasma proteins: Secondary | ICD-10-CM

## 2017-07-01 DIAGNOSIS — R748 Abnormal levels of other serum enzymes: Secondary | ICD-10-CM

## 2017-07-01 DIAGNOSIS — Z7952 Long term (current) use of systemic steroids: Secondary | ICD-10-CM

## 2017-07-01 DIAGNOSIS — D696 Thrombocytopenia, unspecified: Secondary | ICD-10-CM

## 2017-07-01 DIAGNOSIS — J690 Pneumonitis due to inhalation of food and vomit: Secondary | ICD-10-CM | POA: Diagnosis not present

## 2017-07-01 DIAGNOSIS — E7800 Pure hypercholesterolemia, unspecified: Secondary | ICD-10-CM | POA: Diagnosis present

## 2017-07-01 DIAGNOSIS — I2510 Atherosclerotic heart disease of native coronary artery without angina pectoris: Secondary | ICD-10-CM | POA: Diagnosis present

## 2017-07-01 DIAGNOSIS — Z66 Do not resuscitate: Secondary | ICD-10-CM | POA: Diagnosis present

## 2017-07-01 DIAGNOSIS — R7989 Other specified abnormal findings of blood chemistry: Secondary | ICD-10-CM

## 2017-07-01 DIAGNOSIS — G40909 Epilepsy, unspecified, not intractable, without status epilepticus: Secondary | ICD-10-CM | POA: Diagnosis not present

## 2017-07-01 DIAGNOSIS — F05 Delirium due to known physiological condition: Secondary | ICD-10-CM

## 2017-07-01 DIAGNOSIS — G8929 Other chronic pain: Secondary | ICD-10-CM | POA: Diagnosis present

## 2017-07-01 DIAGNOSIS — I110 Hypertensive heart disease with heart failure: Secondary | ICD-10-CM | POA: Diagnosis present

## 2017-07-01 DIAGNOSIS — I5032 Chronic diastolic (congestive) heart failure: Secondary | ICD-10-CM

## 2017-07-01 DIAGNOSIS — M545 Low back pain: Secondary | ICD-10-CM | POA: Diagnosis present

## 2017-07-01 DIAGNOSIS — Z7901 Long term (current) use of anticoagulants: Secondary | ICD-10-CM

## 2017-07-01 DIAGNOSIS — R413 Other amnesia: Secondary | ICD-10-CM | POA: Diagnosis present

## 2017-07-01 DIAGNOSIS — D638 Anemia in other chronic diseases classified elsewhere: Secondary | ICD-10-CM

## 2017-07-01 DIAGNOSIS — G9340 Encephalopathy, unspecified: Secondary | ICD-10-CM

## 2017-07-01 DIAGNOSIS — J189 Pneumonia, unspecified organism: Secondary | ICD-10-CM | POA: Diagnosis present

## 2017-07-01 DIAGNOSIS — I5033 Acute on chronic diastolic (congestive) heart failure: Secondary | ICD-10-CM | POA: Diagnosis present

## 2017-07-01 DIAGNOSIS — I482 Chronic atrial fibrillation, unspecified: Secondary | ICD-10-CM

## 2017-07-01 DIAGNOSIS — F0390 Unspecified dementia without behavioral disturbance: Secondary | ICD-10-CM | POA: Diagnosis present

## 2017-07-01 DIAGNOSIS — G9341 Metabolic encephalopathy: Secondary | ICD-10-CM | POA: Diagnosis present

## 2017-07-01 DIAGNOSIS — Z87891 Personal history of nicotine dependence: Secondary | ICD-10-CM

## 2017-07-01 DIAGNOSIS — E785 Hyperlipidemia, unspecified: Secondary | ICD-10-CM | POA: Diagnosis present

## 2017-07-01 DIAGNOSIS — Z79890 Hormone replacement therapy (postmenopausal): Secondary | ICD-10-CM

## 2017-07-01 DIAGNOSIS — N179 Acute kidney failure, unspecified: Secondary | ICD-10-CM | POA: Diagnosis present

## 2017-07-01 DIAGNOSIS — E860 Dehydration: Secondary | ICD-10-CM | POA: Diagnosis present

## 2017-07-01 DIAGNOSIS — E039 Hypothyroidism, unspecified: Secondary | ICD-10-CM | POA: Diagnosis present

## 2017-07-01 DIAGNOSIS — I480 Paroxysmal atrial fibrillation: Secondary | ICD-10-CM | POA: Diagnosis not present

## 2017-07-01 DIAGNOSIS — E870 Hyperosmolality and hypernatremia: Secondary | ICD-10-CM | POA: Diagnosis present

## 2017-07-01 LAB — CBC WITH DIFFERENTIAL/PLATELET
Basophils Absolute: 0 10*3/uL (ref 0.0–0.1)
Basophils Relative: 0 %
Eosinophils Absolute: 0 10*3/uL (ref 0.0–0.7)
Eosinophils Relative: 1 %
HCT: 31.4 % — ABNORMAL LOW (ref 39.0–52.0)
Hemoglobin: 9.6 g/dL — ABNORMAL LOW (ref 13.0–17.0)
Lymphocytes Relative: 8 %
Lymphs Abs: 0.5 10*3/uL — ABNORMAL LOW (ref 0.7–4.0)
MCH: 31 pg (ref 26.0–34.0)
MCHC: 30.6 g/dL (ref 30.0–36.0)
MCV: 101.3 fL — ABNORMAL HIGH (ref 78.0–100.0)
Monocytes Absolute: 0.3 10*3/uL (ref 0.1–1.0)
Monocytes Relative: 5 %
Neutro Abs: 5 10*3/uL (ref 1.7–7.7)
Neutrophils Relative %: 86 %
Platelets: 116 10*3/uL — ABNORMAL LOW (ref 150–400)
RBC: 3.1 MIL/uL — ABNORMAL LOW (ref 4.22–5.81)
RDW: 16.1 % — ABNORMAL HIGH (ref 11.5–15.5)
WBC: 5.7 10*3/uL (ref 4.0–10.5)

## 2017-07-01 LAB — COMPREHENSIVE METABOLIC PANEL
ALT: 15 U/L — ABNORMAL LOW (ref 17–63)
AST: 14 U/L — ABNORMAL LOW (ref 15–41)
Albumin: 3.2 g/dL — ABNORMAL LOW (ref 3.5–5.0)
Alkaline Phosphatase: 44 U/L (ref 38–126)
Anion gap: 10 (ref 5–15)
BUN: 30 mg/dL — ABNORMAL HIGH (ref 6–20)
CO2: 27 mmol/L (ref 22–32)
Calcium: 8.7 mg/dL — ABNORMAL LOW (ref 8.9–10.3)
Chloride: 109 mmol/L (ref 101–111)
Creatinine, Ser: 1.51 mg/dL — ABNORMAL HIGH (ref 0.61–1.24)
GFR calc Af Amer: 48 mL/min — ABNORMAL LOW (ref >60)
GFR calc non Af Amer: 42 mL/min — ABNORMAL LOW (ref >60)
Glucose, Bld: 121 mg/dL — ABNORMAL HIGH (ref 65–99)
Potassium: 4.7 mmol/L (ref 3.5–5.1)
Sodium: 146 mmol/L — ABNORMAL HIGH (ref 135–145)
Total Bilirubin: 0.6 mg/dL (ref 0.3–1.2)
Total Protein: 5.5 g/dL — ABNORMAL LOW (ref 6.5–8.1)

## 2017-07-01 LAB — URINALYSIS, ROUTINE W REFLEX MICROSCOPIC
Bilirubin Urine: NEGATIVE
Glucose, UA: NEGATIVE mg/dL
Hgb urine dipstick: NEGATIVE
Ketones, ur: NEGATIVE mg/dL
Leukocytes, UA: NEGATIVE
Nitrite: NEGATIVE
Protein, ur: NEGATIVE mg/dL
Specific Gravity, Urine: 1.016 (ref 1.005–1.030)
pH: 5 (ref 5.0–8.0)

## 2017-07-01 LAB — RAPID URINE DRUG SCREEN, HOSP PERFORMED
Amphetamines: NOT DETECTED
Barbiturates: NOT DETECTED
Benzodiazepines: NOT DETECTED
Cocaine: NOT DETECTED
Opiates: POSITIVE — AB
Tetrahydrocannabinol: NOT DETECTED

## 2017-07-01 LAB — SALICYLATE LEVEL: Salicylate Lvl: <7 mg/dL (ref 2.8–30.0)

## 2017-07-01 LAB — PROTIME-INR
INR: 1.48
Prothrombin Time: 17.8 seconds — ABNORMAL HIGH (ref 11.4–15.2)

## 2017-07-01 LAB — LACTIC ACID, PLASMA: Lactic Acid, Venous: 1.6 mmol/L (ref 0.5–1.9)

## 2017-07-01 LAB — TROPONIN I: Troponin I: 0.07 ng/mL (ref <0.03)

## 2017-07-01 LAB — VALPROIC ACID LEVEL: Valproic Acid Lvl: 60 ug/mL (ref 50.0–100.0)

## 2017-07-01 LAB — BRAIN NATRIURETIC PEPTIDE: B Natriuretic Peptide: 345.9 pg/mL — ABNORMAL HIGH (ref 0.0–100.0)

## 2017-07-01 LAB — ACETAMINOPHEN LEVEL: Acetaminophen (Tylenol), Serum: <10 ug/mL — ABNORMAL LOW (ref 10–30)

## 2017-07-01 LAB — ETHANOL: Alcohol, Ethyl (B): <10 mg/dL (ref <10)

## 2017-07-01 MED ORDER — DOXAZOSIN MESYLATE 4 MG PO TABS
4.0000 mg | ORAL_TABLET | Freq: Every day | ORAL | Status: DC
  Administered 2017-07-02 – 2017-07-03 (×2): 4 mg via ORAL
  Filled 2017-07-01 (×2): qty 1

## 2017-07-01 MED ORDER — GABAPENTIN 300 MG PO CAPS
300.0000 mg | ORAL_CAPSULE | Freq: Two times a day (BID) | ORAL | Status: DC
  Administered 2017-07-01 – 2017-07-05 (×8): 300 mg via ORAL
  Filled 2017-07-01 (×8): qty 1

## 2017-07-01 MED ORDER — SODIUM CHLORIDE 0.9% FLUSH: 3.0000 mL | INTRAVENOUS | Status: DC | PRN

## 2017-07-01 MED ORDER — METOPROLOL TARTRATE 25 MG PO TABS
25.0000 mg | ORAL_TABLET | Freq: Two times a day (BID) | ORAL | Status: DC
  Administered 2017-07-01 – 2017-07-05 (×8): 25 mg via ORAL
  Filled 2017-07-01 (×8): qty 1

## 2017-07-01 MED ORDER — LEVOTHYROXINE SODIUM 75 MCG PO TABS
150.0000 ug | ORAL_TABLET | Freq: Every day | ORAL | Status: DC
  Administered 2017-07-02 – 2017-07-05 (×4): 150 ug via ORAL
  Filled 2017-07-01 (×4): qty 2

## 2017-07-01 MED ORDER — LIDOCAINE 5 % EX PTCH
1.0000 | MEDICATED_PATCH | Freq: Every day | CUTANEOUS | Status: DC | PRN
Start: 2017-07-01 — End: 2017-07-03
  Administered 2017-07-03: 1 via TRANSDERMAL
  Filled 2017-07-01: qty 1

## 2017-07-01 MED ORDER — ACETAMINOPHEN 325 MG PO TABS: 650.0000 mg | ORAL_TABLET | Freq: Four times a day (QID) | ORAL | Status: DC | PRN

## 2017-07-01 MED ORDER — SODIUM CHLORIDE 0.9 % IV SOLN
250.0000 mL | INTRAVENOUS | Status: DC | PRN
Start: 2017-07-01 — End: 2017-07-05

## 2017-07-01 MED ORDER — COLLAGENASE 250 UNIT/GM EX OINT
TOPICAL_OINTMENT | Freq: Every day | CUTANEOUS | Status: DC
  Filled 2017-07-01: qty 90

## 2017-07-01 MED ORDER — LIP MEDEX EX OINT
TOPICAL_OINTMENT | CUTANEOUS | Status: AC
  Administered 2017-07-01: 23:00:00
  Filled 2017-07-01: qty 7

## 2017-07-01 MED ORDER — DIVALPROEX SODIUM 250 MG PO DR TAB
750.0000 mg | DELAYED_RELEASE_TABLET | Freq: Every day | ORAL | Status: DC
  Administered 2017-07-01 – 2017-07-04 (×4): 750 mg via ORAL
  Filled 2017-07-01 (×4): qty 3

## 2017-07-01 MED ORDER — VANCOMYCIN HCL IN DEXTROSE 1-5 GM/200ML-% IV SOLN
1000.0000 mg | Freq: Once | INTRAVENOUS | Status: AC
  Administered 2017-07-01: 1000 mg via INTRAVENOUS
  Filled 2017-07-01: qty 200

## 2017-07-01 MED ORDER — HYDROCODONE-ACETAMINOPHEN 10-325 MG PO TABS
1.0000 | ORAL_TABLET | Freq: Four times a day (QID) | ORAL | Status: DC | PRN
  Administered 2017-07-03 – 2017-07-05 (×4): 1 via ORAL
  Filled 2017-07-01 (×4): qty 1

## 2017-07-01 MED ORDER — DIVALPROEX SODIUM 250 MG PO DR TAB
250.0000 mg | DELAYED_RELEASE_TABLET | Freq: Every day | ORAL | Status: DC
  Administered 2017-07-02 – 2017-07-05 (×4): 250 mg via ORAL
  Filled 2017-07-01 (×4): qty 1

## 2017-07-01 MED ORDER — FUROSEMIDE 20 MG PO TABS
20.0000 mg | ORAL_TABLET | Freq: Every day | ORAL | Status: DC
  Administered 2017-07-02: 20 mg via ORAL
  Filled 2017-07-01: qty 1

## 2017-07-01 MED ORDER — SENNA 8.6 MG PO TABS
1.0000 | ORAL_TABLET | Freq: Two times a day (BID) | ORAL | Status: DC
  Administered 2017-07-02 – 2017-07-04 (×4): 8.6 mg via ORAL
  Filled 2017-07-01 (×7): qty 1

## 2017-07-01 MED ORDER — LIP MEDEX EX OINT: TOPICAL_OINTMENT | CUTANEOUS | Status: DC | PRN

## 2017-07-01 MED ORDER — DILTIAZEM HCL ER COATED BEADS 180 MG PO CP24
300.0000 mg | ORAL_CAPSULE | Freq: Every day | ORAL | Status: DC
  Administered 2017-07-02 – 2017-07-05 (×4): 300 mg via ORAL
  Filled 2017-07-01 (×4): qty 1

## 2017-07-01 MED ORDER — SODIUM CHLORIDE 0.9 % IV SOLN
3.0000 g | Freq: Four times a day (QID) | INTRAVENOUS | Status: DC
  Administered 2017-07-01 – 2017-07-03 (×6): 3 g via INTRAVENOUS
  Filled 2017-07-01 (×7): qty 3

## 2017-07-01 MED ORDER — PREDNISONE 5 MG PO TABS
10.0000 mg | ORAL_TABLET | Freq: Every day | ORAL | Status: DC
  Administered 2017-07-02 – 2017-07-05 (×4): 10 mg via ORAL
  Filled 2017-07-01 (×4): qty 2

## 2017-07-01 MED ORDER — ACETAMINOPHEN 650 MG RE SUPP: 650.0000 mg | Freq: Four times a day (QID) | RECTAL | Status: DC | PRN

## 2017-07-01 MED ORDER — SODIUM CHLORIDE 0.9% FLUSH
3.0000 mL | Freq: Two times a day (BID) | INTRAVENOUS | Status: DC
  Administered 2017-07-02 – 2017-07-05 (×4): 3 mL via INTRAVENOUS

## 2017-07-01 MED ORDER — APIXABAN 5 MG PO TABS
5.0000 mg | ORAL_TABLET | Freq: Two times a day (BID) | ORAL | Status: DC
  Administered 2017-07-01 – 2017-07-05 (×8): 5 mg via ORAL
  Filled 2017-07-01 (×8): qty 1

## 2017-07-01 MED ORDER — SODIUM CHLORIDE 0.9 % IV SOLN
1.0000 g | Freq: Two times a day (BID) | INTRAVENOUS | Status: DC
  Administered 2017-07-01: 1 g via INTRAVENOUS
  Filled 2017-07-01 (×2): qty 1

## 2017-07-01 MED ORDER — SODIUM CHLORIDE 0.9 % IV SOLN
INTRAVENOUS | Status: DC
  Administered 2017-07-01: 18:00:00 via INTRAVENOUS

## 2017-07-01 MED ORDER — DIVALPROEX SODIUM 250 MG PO DR TAB: 250.0000 mg | DELAYED_RELEASE_TABLET | Freq: Two times a day (BID) | ORAL | Status: DC

## 2017-07-01 MED ORDER — PANTOPRAZOLE SODIUM 40 MG PO TBEC
40.0000 mg | DELAYED_RELEASE_TABLET | Freq: Every day | ORAL | Status: DC
  Administered 2017-07-02 – 2017-07-05 (×4): 40 mg via ORAL
  Filled 2017-07-01 (×4): qty 1

## 2017-07-01 NOTE — ED Notes (Signed)
Patient is unable stand, Orthostatic vitals can not be completed

## 2017-07-01 NOTE — ED Triage Notes (Signed)
Patient coming from home with c/o altered mental status. Patient wife call EMS stated patient been very hard to arouse. Pt did wake up little but was confused. Per ems pt been alert and oriented x 4.

## 2017-07-01 NOTE — Telephone Encounter (Signed)
Returned call to patient's wife.She stated husband has been confused today.Stated he was hard to wake up.Stated she called Psychologist, prison and probation services and was advised to call 911.Stated EMS is currently at home loading up husband to take to [facility] ED.Advised Dr.Snead is out of office.I will send message to her to make her aware.

## 2017-07-01 NOTE — ED Notes (Signed)
ED TO INPATIENT HANDOFF REPORT  Name/Age/Gender [name] 84 y.o. male  Code Status Code Status History    Date Active Date Inactive Code Status Order ID Comments User Context   05/24/2017 0348 06/01/2017 2026 Full Code [id number]  [name], MD ED   04/17/2017 2317 04/25/2017 1955 Full Code [id number]  [name], MD ED   07/06/2015 0919 07/11/2015 1902 Full Code [id number]  [name], MD ED   01/31/2015 1837 02/05/2015 1735 Full Code [id number]  [name], MD Inpatient   01/28/2013 2137 01/31/2013 1359 Full Code [id number]  [name], MD ED   01/28/2012 1808 01/30/2012 1847 Full Code [id number]  [name], [name], RN Inpatient      Home/SNF/Other Home  Chief Complaint Altered Mental Status  Level of Care/Admitting Diagnosis ED Disposition    ED Disposition Condition [facility]: [place] [[id number]]  Level of Care: Med-Surg [16]  Diagnosis: Pneumonia [371062]  Admitting Physician: [name] [[id number]]  Attending Physician: [name] [[id number]]  PT Class (Do Not Modify): Observation [104]  PT Acc Code (Do Not Modify): Observation [10022]       Medical History Past Medical History:  Diagnosis Date  . Abnormality of gait 09/22/2015  . Arthritis   . Atrial fibrillation ([place])   . CAD (coronary artery disease)    Stent to RCA, Penta stent, 99% reduced to 0% 2002.  . Cancer ([place])    skin CA removed from back  . Chronic insomnia 03/30/2015  . Complication of anesthesia    trouble waking up  . Constipation    from medications taken  . GERD (gastroesophageal reflux disease)   . Hypercholesteremia   . Hypertension   . Hypothyroidism   . Memory difficulty 09/22/2015  . Osteoarthritis   . Seizures ([place])   . Sepsis ([place]) 05/2017  . Transient alteration of awareness 03/30/2015  . Vertigo    hx of    Allergies Allergies  Allergen Reactions  . Demerol [Meperidine] Nausea And Vomiting  .  Keppra [Levetiracetam] Other (See Comments)    Causes sleepiness    IV Location/Drains/Wounds Patient Lines/Drains/Airways Status   Active Line/Drains/Airways    Name:   Placement date:   Placement time:   Site:   Days:   Peripheral IV 07/01/17 Left Forearm   07/01/17    -    Forearm   less than 1   External Urinary Catheter   05/29/17    2100    -   33   External Urinary Catheter   07/01/17    1657    -   less than 1   Incision 01/28/12 Neck Other (Comment)   01/28/12    1611     1981   Incision 01/29/13 Neck Other (Comment)   01/29/13    1921     1614   Incision (Closed) 05/10/15 Finger (Comment which one) Right   05/10/15    1751     783   Wound / Incision (Open or Dehisced) 04/18/17 Venous stasis ulcer Ankle Medial   04/18/17    2315    Ankle   74   Wound / Incision (Open or Dehisced) 05/24/17 Venous stasis ulcer Leg Right;Medial;Lower   05/24/17    1809    Leg   38   Wound / Incision (Open or Dehisced) 05/24/17 Non-pressure wound Toe (Comment  which one) Left;Distal   05/24/17  1814    Toe (Comment  which one)   38   Wound / Incision (Open or Dehisced) 05/24/17 Non-pressure wound Toe (Comment  which one) Right;Distal   05/24/17    1815    Toe (Comment  which one)   38          Labs/Imaging Results for orders placed or performed during the hospital encounter of 07/01/17 (from the past 48 hour(s))  Lactic acid, plasma     Status: None   Collection Time: 07/01/17  3:24 PM  Result Value Ref Range   Lactic Acid, Venous 1.6 0.5 - 1.9 mmol/L    Comment: Performed at [facility], [place] [address], [place], [place] [address]  Urine rapid drug screen (hosp performed)     Status: Abnormal   Collection Time: 07/01/17  3:24 PM  Result Value Ref Range   Opiates POSITIVE (A) NONE DETECTED   Cocaine NONE DETECTED NONE DETECTED   Benzodiazepines NONE DETECTED NONE DETECTED   Amphetamines NONE DETECTED NONE DETECTED   Tetrahydrocannabinol NONE DETECTED NONE DETECTED    Barbiturates NONE DETECTED NONE DETECTED    Comment: (NOTE) DRUG SCREEN FOR MEDICAL PURPOSES ONLY.  IF CONFIRMATION IS NEEDED FOR ANY PURPOSE, NOTIFY LAB WITHIN 5 DAYS. LOWEST DETECTABLE LIMITS FOR URINE DRUG SCREEN Drug Class                     Cutoff (ng/mL) Amphetamine and metabolites    1000 Barbiturate and metabolites    200 Benzodiazepine                 532 Tricyclics and metabolites     300 Opiates and metabolites        300 Cocaine and metabolites        300 THC                            50 Performed at [facility], [place] [address] [place], [place] [address]   Urinalysis, Routine w reflex microscopic     Status: None   Collection Time: 07/01/17  3:25 PM  Result Value Ref Range   Color, Urine YELLOW YELLOW   APPearance CLEAR CLEAR   Specific Gravity, Urine 1.016 1.005 - 1.030   pH 5.0 5.0 - 8.0   Glucose, UA NEGATIVE NEGATIVE mg/dL   Hgb urine dipstick NEGATIVE NEGATIVE   Bilirubin Urine NEGATIVE NEGATIVE   Ketones, ur NEGATIVE NEGATIVE mg/dL   Protein, ur NEGATIVE NEGATIVE mg/dL   Nitrite NEGATIVE NEGATIVE   Leukocytes, UA NEGATIVE NEGATIVE    Comment: Performed at [facility], [place] [address] [place], [place] [address]  Brain natriuretic peptide     Status: Abnormal   Collection Time: 07/01/17  3:26 PM  Result Value Ref Range   B Natriuretic Peptide 345.9 (H) 0.0 - 100.0 pg/mL    Comment: Performed at [facility], [place] [address] [place], [place] [address]  Comprehensive metabolic panel     Status: Abnormal   Collection Time: 07/01/17  4:12 PM  Result Value Ref Range   Sodium 146 (H) 135 - 145 mmol/L   Potassium 4.7 3.5 - 5.1 mmol/L   Chloride 109 101 - 111 mmol/L   CO2 27 22 - 32 mmol/L   Glucose, Bld 121 (H) 65 - 99 mg/dL   BUN 30 (H) 6 - 20 mg/dL   Creatinine, Ser 1.51 (H) 0.61 -  1.24 mg/dL   Calcium 8.7 (L) 8.9 - 10.3 mg/dL   Total Protein 5.5 (L) 6.5 - 8.1 g/dL   Albumin 3.2 (L) 3.5 - 5.0  g/dL   AST 14 (L) 15 - 41 U/L   ALT 15 (L) 17 - 63 U/L   Alkaline Phosphatase 44 38 - 126 U/L   Total Bilirubin 0.6 0.3 - 1.2 mg/dL   GFR calc non Af Amer 42 (L) >60 mL/min   GFR calc Af Amer 48 (L) >60 mL/min    Comment: (NOTE) The eGFR has been calculated using the CKD EPI equation. This calculation has not been validated in all clinical situations. eGFR's persistently <60 mL/min signify possible Chronic Kidney Disease.    Anion gap 10 5 - 15    Comment: Performed at [facility], [place] [address], [place], [place] [address]  Acetaminophen level     Status: Abnormal   Collection Time: 07/01/17  4:12 PM  Result Value Ref Range   Acetaminophen (Tylenol), Serum <10 (L) 10 - 30 ug/mL    Comment:        THERAPEUTIC CONCENTRATIONS VARY SIGNIFICANTLY. A RANGE OF 10-30 ug/mL MAY BE AN EFFECTIVE CONCENTRATION FOR MANY PATIENTS. HOWEVER, SOME ARE BEST TREATED AT CONCENTRATIONS OUTSIDE THIS RANGE. ACETAMINOPHEN CONCENTRATIONS >150 ug/mL AT 4 HOURS AFTER INGESTION AND >50 ug/mL AT 12 HOURS AFTER INGESTION ARE OFTEN ASSOCIATED WITH TOXIC REACTIONS. Performed at [facility], [place] [address] [place], [place] [address]   Ethanol     Status: None   Collection Time: 07/01/17  4:12 PM  Result Value Ref Range   Alcohol, Ethyl (B) <10 <10 mg/dL    Comment:        LOWEST DETECTABLE LIMIT FOR SERUM ALCOHOL IS 10 mg/dL FOR MEDICAL PURPOSES ONLY Performed at [place] [address] [place], [place] [address]   Salicylate level     Status: None   Collection Time: 07/01/17  4:12 PM  Result Value Ref Range   Salicylate Lvl <5.3 2.8 - 30.0 mg/dL    Comment: Performed at [facility], [place] [address], [place], [place] [address]  Troponin I     Status: Abnormal   Collection Time: 07/01/17  4:12 PM  Result Value Ref Range   Troponin I 0.07 (HH) <0.03 ng/mL    Comment: CRITICAL RESULT CALLED TO, READ BACK BY AND  VERIFIED WITH: K.ZULETA AT 1718 ON 07/01/17 BY N.THOMPSON Performed at [facility], [place] [address], [place], [place] [address]   CBC with Differential     Status: Abnormal   Collection Time: 07/01/17  4:12 PM  Result Value Ref Range   WBC 5.7 4.0 - 10.5 K/uL   RBC 3.10 (L) 4.22 - 5.81 MIL/uL   Hemoglobin 9.6 (L) 13.0 - 17.0 g/dL   HCT 31.4 (L) 39.0 - 52.0 %   MCV 101.3 (H) 78.0 - 100.0 fL   MCH 31.0 26.0 - 34.0 pg   MCHC 30.6 30.0 - 36.0 g/dL   RDW 16.1 (H) 11.5 - 15.5 %   Platelets 116 (L) 150 - 400 K/uL    Comment: REPEATED TO VERIFY SPECIMEN CHECKED FOR CLOTS PLATELET COUNT CONFIRMED BY SMEAR    Neutrophils Relative % 86 %   Neutro Abs 5.0 1.7 - 7.7 K/uL   Lymphocytes Relative 8 %   Lymphs Abs 0.5 (L) 0.7 - 4.0 K/uL   Monocytes Relative 5 %   Monocytes Absolute 0.3 0.1 -  1.0 K/uL   Eosinophils Relative 1 %   Eosinophils Absolute 0.0 0.0 - 0.7 K/uL   Basophils Relative 0 %   Basophils Absolute 0.0 0.0 - 0.1 K/uL    Comment: Performed at [facility], [place] [address] [place], [place] [address]  Protime-INR     Status: Abnormal   Collection Time: 07/01/17  4:12 PM  Result Value Ref Range   Prothrombin Time 17.8 (H) 11.4 - 15.2 seconds   INR 1.48     Comment: Performed at [facility], [place] [address] [place], [place] [address]  Valproic acid level     Status: None   Collection Time: 07/01/17  4:12 PM  Result Value Ref Range   Valproic Acid Lvl 60 50.0 - 100.0 ug/mL    Comment: Performed at [facility], [place] [address] [place], [place] [address]   Dg Chest 2 View  Result Date: 07/01/2017 CLINICAL DATA:  Altered mental status, and confusion. EXAM: CHEST - 2 VIEW COMPARISON:  05/27/2017. FINDINGS: The heart is enlarged. BILATERAL lower lobe pulmonary opacities have progressed from prior radiograph, likely represent combination of atelectasis, effusion, and infiltrate. Old RIGHT-sided rib fractures  are stable. Worsening aeration from priors. IMPRESSION: Worsening aeration. Cardiomegaly. Lower lobe opacities are increased. Electronically Signed   By: Staci Righter M.D.   On: 07/01/2017 15:52   Ct Head Wo Contrast  Result Date: 07/01/2017 CLINICAL DATA:  Patient coming from home with c/o altered mental status. Patient wife call EMS stated patient been very hard to arouse. Pt did wake up little but was confused. Per ems pt been alert and oriented x 4. EXAM: CT HEAD WITHOUT CONTRAST TECHNIQUE: Contiguous axial images were obtained from the base of the skull through the vertex without intravenous contrast. COMPARISON:  04/17/2017 FINDINGS: Brain: No evidence of acute infarction, hemorrhage, extra-axial collection, ventriculomegaly, or mass effect. Generalized cerebral atrophy. Periventricular white matter low attenuation likely secondary to microangiopathy. Vascular: Cerebrovascular atherosclerotic calcifications are noted. Skull: Negative for fracture or focal lesion. Sinuses/Orbits: Visualized portions of the orbits are unremarkable. Visualized portions of the paranasal sinuses and mastoid air cells are unremarkable. Other: None. IMPRESSION: 1. No acute intracranial pathology. 2. Chronic microvascular disease and cerebral atrophy. Electronically Signed   By: Kathreen Devoid   On: 07/01/2017 15:51    Pending Labs Unresulted Labs (From admission, onward)   Start     Ordered   07/01/17 1525  Urine culture  STAT,   STAT     07/01/17 1524   07/01/17 1524  Lactic acid, plasma  Now then every 2 hours,   STAT     07/01/17 1524   Signed and Held  Culture, sputum-assessment  Once,   R     Signed and Held   Signed and Held  Gram stain  Once,   R     Signed and Held   Signed and Held  Strep pneumoniae urinary antigen  Once,   R     Signed and Held   Signed and Held  Basic metabolic panel  Tomorrow morning,   R     Signed and Held   Signed and Held  CBC  Tomorrow morning,   R     Signed and Held       Vitals/Pain Today's Vitals   07/01/17 1536 07/01/17 1617 07/01/17 1822  BP:  104/68 (!) 95/54  Pulse:  69 64  Resp:  18 14  Temp:  97.8 F (36.6  C)   TempSrc:  Oral   SpO2: 94% 93% 95%    Isolation Precautions No active isolations  Medications Medications  ceFEPIme (MAXIPIME) 1 g in sodium chloride 0.9 % 100 mL IVPB (0 g Intravenous Stopped 07/01/17 1846)  0.9 %  sodium chloride infusion ( Intravenous New Bag/Given 07/01/17 1752)  vancomycin (VANCOCIN) IVPB 1000 mg/200 mL premix (1,000 mg Intravenous New Bag/Given 07/01/17 1845)    Mobility walks with device but too weak now

## 2017-07-01 NOTE — H&P (Signed)
History and Physical  [name] ZHY:[id number] DOB: 1933/03/16 DOA: 07/01/2017  PCP: [name], MD   Chief Complaint: Confused  HPI:  84 year old man PMH coronary artery disease, atrial fibrillation, seizure disorder, dementia, presented to the emergency department with confusion.  Initial evaluation suggested pneumonia, possibly aspiration the patient was admitted for further evaluation.  Patient mildly confused.  All history obtained from wife at bedside.  Patient has been hospitalized twice in the last 3 months, both times for pneumonia and sepsis.  The first day he went to rehab, but after the most recent admission he went home with his wife.  He has been home approximately 1 month and has been making gradual improvements in strength and mobility.  He has mild memory loss but in general is quite sharp per wife.  They had dinner last night with friends and he seemed to do well although he was tired.  He did have a choking episode on wine and may have had some food in his mouth at that time.  This morning he did not get up as usual and his wife had to awaken him at 11 AM, had a difficult time doing so and he was quite confused and delirious at the time.  He did not recognize her at that time.  She is not noted any systemic symptoms or focal symptoms other than this.  No specific aggravating or alleviating factors notable or associated symptoms.  However the confusion was a dramatic change from yesterday.  Home health nurse came out to see the patient and agreed and recommended hospital evaluation.   ED Course: Treated with cefepime, vancomycin, IV fluids  Review of Systems:  Patient's history is somewhat suspect but: Negative for fever, new visual changes, sore throat, rash, new muscle aches, chest pain, SOB, dysuria, bleeding, n/v/abdominal pain.  Positive for chronic back pain  Past Medical History:  Diagnosis Date  . Abnormality of gait 09/22/2015  . Arthritis   . Atrial  fibrillation ([place])   . CAD (coronary artery disease)    Stent to RCA, Penta stent, 99% reduced to 0% 2002.  . Cancer ([place])    skin CA removed from back  . Chronic insomnia 03/30/2015  . Complication of anesthesia    trouble waking up  . Constipation    from medications taken  . GERD (gastroesophageal reflux disease)   . Hypercholesteremia   . Hypertension   . Hypothyroidism   . Memory difficulty 09/22/2015  . Osteoarthritis   . Seizures ([place])   . Sepsis ([place]) 05/2017  . Transient alteration of awareness 03/30/2015  . Vertigo    hx of    Past Surgical History:  Procedure Laterality Date  . BACK SURGERY    . EYE SURGERY     Bilateral Cataract surgery   . HERNIA REPAIR    . I&D EXTREMITY Right 05/10/2015   Procedure: IRRIGATION AND DEBRIDEMENT EXTREMITY;  Surgeon: Leanora Cover, MD;  Location: [facility];  Service: Orthopedics;  Laterality: Right;  . KNEE ARTHROPLASTY     right knee X 2; left knee once  . LAMINECTOMY     X 6  . POSTERIOR CERVICAL FUSION/FORAMINOTOMY  01/28/2012   Procedure: POSTERIOR CERVICAL FUSION/FORAMINOTOMY LEVEL 3;  Surgeon: [name], MD;  Location: [facility];  Service: Neurosurgery;  Laterality: Left;  Posterior Cervical Five-Thoracic One Fusion, Arthrodesis with LEFT Cervical Seven-thoracic One Laminectomy, Foraminotomy and Resection of Synovial Cyst  . POSTERIOR CERVICAL FUSION/FORAMINOTOMY N/A 01/29/2013  Procedure: POSTERIOR CERVICAL FUSION/FORAMINOTOMY LEVEL 1 and C2-5 Posteriolateral Arthrodesis;  Surgeon: [name], MD;  Location: [facility];  Service: Neurosurgery;  Laterality: N/A;  C2-C3 Laminectomy C2-C3 posterior cervical arthrodesis  . TONSILLECTOMY       reports that he has quit smoking. He has never used smokeless tobacco. He reports that he drinks alcohol. He reports that he does not use drugs. Mobility: Ambulatory with a walker  Allergies  Allergen Reactions  . Demerol [Meperidine] Nausea And  Vomiting  . Keppra [Levetiracetam] Other (See Comments)    Causes sleepiness    Family History  Problem Relation Age of Onset  . Hypertension Mother   . Cancer Mother   . Kidney failure Father   . Heart disease Father      Prior to Admission medications   Medication Sig Start Date End Date Taking? Authorizing Provider  apixaban (ELIQUIS) 5 MG TABS tablet Take 1 tablet (5 mg total) by mouth 2 (two) times daily. 06/28/17  Yes Skeet Latch, MD  calcium carbonate (OS-CAL - DOSED IN MG OF ELEMENTAL CALCIUM) 1250 (500 Ca) MG tablet Take 1 tablet (1,250 mg total) by mouth 2 (two) times daily with a meal. 06/01/17  Yes Mikhail, Velta Addison, DO  Cholecalciferol (VITAMIN D-3) 1000 units CAPS Take 1,000 Units by mouth 2 (two) times daily.   Yes [provider]  Coenzyme Q10 (COQ10) 200 MG CAPS Take 200 mg by mouth at bedtime.   Yes [provider]  collagenase (SANTYL) ointment Apply topically daily. 06/02/17  Yes Mikhail, Maryann, DO  cyanocobalamin 500 MCG tablet Take 500 mcg by mouth at bedtime.   Yes [provider]  diltiazem (CARDIZEM CD) 300 MG 24 hr capsule Take 1 capsule (300 mg total) by mouth daily. 06/28/17  Yes Skeet Latch, MD  divalproex (DEPAKOTE) 250 MG DR tablet Take 3 tablets (750 mg total) by mouth every 12 (twelve) hours. Patient taking differently: Take 250-750 mg by mouth 2 (two) times daily. Take 1 tablet (250 mg) in the morning and Take 3 tablets (750 mg) in the evening. 07/09/15  Yes Robbie Lis, MD  docusate sodium (COLACE) 100 MG capsule Take 200 mg by mouth 2 (two) times daily.    Yes [provider]  doxazosin (CARDURA) 4 MG tablet Take 1 tablet (4 mg total) by mouth daily. 06/28/17  Yes Skeet Latch, MD  ferrous sulfate 325 (65 FE) MG tablet Take 1 tablet (325 mg total) by mouth 2 (two) times daily with a meal. 06/28/17  Yes Skeet Latch, MD  furosemide (LASIX) 20 MG tablet Take 1 tablet (20 mg total) by mouth daily.  06/28/17 09/26/17 Yes Skeet Latch, MD  gabapentin (NEURONTIN) 300 MG capsule Take 300 mg by mouth 2 (two) times daily.   Yes [provider]  HYDROcodone-acetaminophen (NORCO) 10-325 MG tablet Take 1 tablet by mouth every 6 (six) hours as needed for moderate pain. 04/25/17  Yes Lavina Hamman, MD  levothyroxine (SYNTHROID, LEVOTHROID) 150 MCG tablet Take 150 mcg by mouth daily before breakfast. 12/27/11  Yes [provider]  lidocaine (LIDODERM) 5 % Place 1 patch onto the skin daily as needed. Remove & Discard patch within 12 hours or as directed by MD 06/01/17  Yes Mikhail, Velta Addison, DO  metoprolol tartrate (LOPRESSOR) 25 MG tablet Take 1 tablet (25 mg total) by mouth 2 (two) times daily. 06/28/17  Yes Skeet Latch, MD  Multiple Vitamin (MULTIVITAMIN WITH MINERALS) TABS tablet Take 1 tablet by mouth at  bedtime.   Yes [provider]  omeprazole (PRILOSEC) 20 MG capsule Take 20 mg by mouth 2 (two) times daily before a meal.  12/23/11  Yes [provider]  pravastatin (PRAVACHOL) 40 MG tablet Take 40 mg by mouth at bedtime.  10/29/14  Yes [provider]  predniSONE (DELTASONE) 10 MG tablet Take 1 tablet (10 mg total) by mouth daily with breakfast. 04/28/17  Yes Lavina Hamman, MD  senna (SENOKOT) 8.6 MG tablet Take 1 tablet by mouth 2 (two) times daily.    Yes [provider]  vitamin A 8000 UNIT capsule Take 8,000 Units by mouth 2 (two) times daily.   Yes [provider]  vitamin C (ASCORBIC ACID) 500 MG tablet Take 500 mg by mouth every morning.   Yes [provider]  ZETIA 10 MG tablet Take 5 mg by mouth at bedtime.  10/15/11  Yes [provider]  zinc sulfate 220 (50 Zn) MG capsule Take 1 capsule (220 mg total) by mouth every morning. 06/02/17  Yes Mikhail, [place], DO  acetaminophen (TYLENOL) 325 MG tablet Take 650 mg by mouth every 4 (four) hours as needed for mild pain.    [provider]  alendronate  (FOSAMAX) 70 MG tablet Take 70 mg by mouth once a week.  01/06/12   [provider]  feeding supplement, ENSURE ENLIVE, (ENSURE ENLIVE) LIQD Take 237 mLs by mouth 3 (three) times daily between meals. 06/01/17   Mikhail, Velta Addison, DO  furosemide (LASIX) 20 MG tablet Take 1 tablet (20 mg total) by mouth daily. Patient not taking: Reported on 07/01/2017 06/21/17   Skeet Latch, MD  nitroGLYCERIN (NITROSTAT) 0.4 MG SL tablet Place 0.4 mg under the tongue every 5 (five) minutes as needed for chest pain. Reported on 07/20/2015    [provider]    Physical Exam: Vitals:   07/01/17 1617 07/01/17 1822  BP: 104/68 (!) 95/54  Pulse: 69 64  Resp: 18 14  Temp: 97.8 F (36.6 C)   SpO2: 93% 95%    Constitutional:   Appears calm and comfortable.  Appears confused. Eyes:  pupils and irises appear normal Normal lids ENMT:  grossly normal hearing  Lips appear normal Neck:  neck appears normal, no masses no thyromegaly Respiratory:  CTA bilaterally, no w/r/ except for some faint crackles bilateral bases posteriorly left greater than right. Respiratory effort normal Cardiovascular:  RRR, no m/r/g 2+ bilateral LE extremity edema   Abdomen:  Soft, nontender, nondistended. Musculoskeletal:  Grossly unremarkable Skin:  Multiple bruises noted on extremities. palpation of skin: no induration or nodules Chronic wounds right lower extremity Neurologic:  CN appear grossly intact Psychiatric:  Mental status Mood difficult to assess, affect odd Oriented to self, April, not location or year. judgement and insight impaired.  I have personally reviewed following labs and imaging studies  Labs:   BUN elevated, 30.  Creatinine elevated, 1.51.  Potassium within normal limits.  LFTs unremarkable.  Troponin minimally elevated, chronic  BNP minimally elevated  Lactic acid 1.6  Platelets 116, negative  Hemoglobin stable, 9.6.    WBC within normal limits.  Imaging  studies:   Chest x-ray with worsening lower lobe opacities concerning for pneumonia.  Medical tests:   EKG independently reviewed: Atrial fibrillation, no acute changes  Review and summation of old records:   Discharge 3/29.  Treated for sepsis secondary to pneumonia, acute hypoxic respiratory failure, chest pain, mildly elevated troponin, acute kidney injury  Active Problems:  Seizure disorder ([place])   Memory difficulty   Aspiration pneumonia ([place])   Acute encephalopathy   Thrombocytopenia ([facility])   Chronic diastolic CHF (congestive heart failure) ([facility])   Anemia of chronic disease   Troponin level elevated   PAF (paroxysmal atrial fibrillation) ([facility])   Assessment/Plan Suspected pneumonia, history suggest aspiration, with associated acute encephalopathy.. --Afebrile and hemodynamics are stable.  Lactic acid within normal limits. --Empiric antibiotics.  Speech therapy consultation. --Aspiration precautions.  Acute kidney injury versus dehydration --Mildly elevated above baseline, likely related to increased Lasix dosing, however given his peripheral edema, will continue daily Lasix dosing at this point. --BMP in a.m.  Thrombocytopenia, acute --Likely related to acute infection.  CBC in a.m.  Chronic diastolic heart failure --As above does have lower extremity edema.  Will continue Lasix.  Tinea beta-blocker and calcium channel blocker.  Anemia of chronic disease.  Stable.  Follow clinically.  Chronic troponin elevation --Asymptomatic.  EKG nonacute.  No further evaluation suggested  Paroxysmal atrial fibrillation --Stable.  Continue apixaban, diltiazem, metoprolol.  Seizure disorder --Stable.  Continue Depakote.  Chronic right lower extremity wounds present on admission.  Dementia --Per wife this is minor.   Severity of Illness: The appropriate patient status for this patient is OBSERVATION. Observation status is judged to be reasonable and necessary in order to  provide the required intensity of service to ensure the patient's safety. The patient's presenting symptoms, physical exam findings, and initial radiographic and laboratory data in the context of their medical condition is felt to place them at decreased risk for further clinical deterioration. Furthermore, it is anticipated that the patient will be medically stable for discharge from the hospital within 2 midnights of admission. The following factors support the patient status of observation.   See above.    DVT prophylaxis: SCDs Code Status: full per wife Family Communication: wife Consults called: none    Time spent: 52 minutes  Murray Hodgkins, MD  [facility] Direct contact: [phone] --Via [place]  --[email or web address]; password [email or web address]  7PM-7AM contact night coverage as above  07/01/2017, 7:55 PM

## 2017-07-01 NOTE — ED Notes (Signed)
Bed: OL41 Expected date:  Expected time:  Means of arrival:  Comments: 84 yo f ams

## 2017-07-01 NOTE — ED Provider Notes (Signed)
[facility] DEPT Provider Note   CSN: [id number] Arrival date & time: 07/01/17  1508     History   Chief Complaint No chief complaint on file.   HPI [name] is a 84 y.o. male.  The history is provided by the patient, the EMS personnel and a relative. The history is limited by the condition of the patient (Hx dementia).  Pt was seen at 1520. Per EMS and family report: Pt's wife states pt slept until 11am today which was unusual for him. Pt's wife had "a hard time waking him up" and then noted that he was "confused" when finally awakening. Pt has continues to be "confused." Pt does not know why he is in the ED today. Denies CP/SOB, no cough, no N/V/D, no abd pain, no focal motor weakness.   Past Medical History:  Diagnosis Date  . Abnormality of gait 09/22/2015  . Arthritis   . Atrial fibrillation ([place])   . CAD (coronary artery disease)    Stent to RCA, Penta stent, 99% reduced to 0% 2002.  . Cancer ([place])    skin CA removed from back  . Chronic insomnia 03/30/2015  . Complication of anesthesia    trouble waking up  . Constipation    from medications taken  . GERD (gastroesophageal reflux disease)   . Hypercholesteremia   . Hypertension   . Hypothyroidism   . Memory difficulty 09/22/2015  . Osteoarthritis   . Seizures ([place])   . Sepsis ([place]) 05/2017  . Transient alteration of awareness 03/30/2015  . Vertigo    hx of    Patient Active Problem List   Diagnosis Date Noted  . Goals of care, counseling/discussion   . Palliative care by specialist   . HCAP (healthcare-associated pneumonia) 05/24/2017  . Acute metabolic encephalopathy 34/19/3790  . CKD (chronic kidney disease) stage 3, GFR 30-59 ml/min (HCC)   . CAP (community acquired pneumonia) 04/17/2017  . Atrial fibrillation with RVR ([place]) 04/17/2017  . Hallux rigidus, right foot 03/10/2016  . Lumbosacral spondylosis without myelopathy 10/29/2015  . Memory difficulty 09/22/2015  .  Abnormality of gait 09/22/2015  . Seizures ([place]) 07/06/2015  . Hyperglycemia 07/06/2015  . Leukocytosis 07/06/2015  . Metabolic acidosis 24/11/7351  . Chronic pain 07/06/2015  . Chronic insomnia 03/30/2015  . Transient alteration of awareness 03/30/2015  . Abnormal liver function   . Sepsis ([place]) 01/31/2015  . Altered mental status 01/31/2015  . Essential hypertension 01/31/2015  . Constipation 01/31/2015  . Hypothyroidism 01/31/2015  . Seizure disorder ([place]) 01/31/2015  . Bladder outlet obstruction 01/31/2015  . GERD (gastroesophageal reflux disease) 01/31/2015  . Chronic back pain 01/31/2015  . Sigmoid diverticulitis 01/31/2015  . Acute kidney injury ([place]) 01/31/2015    Past Surgical History:  Procedure Laterality Date  . BACK SURGERY    . CERVIX SURGERY    . EYE SURGERY     Bilateral Cataract surgery   . HERNIA REPAIR    . I&D EXTREMITY Right 05/10/2015   Procedure: IRRIGATION AND DEBRIDEMENT EXTREMITY;  Surgeon: Leanora Cover, MD;  Location: [facility];  Service: Orthopedics;  Laterality: Right;  . KNEE ARTHROPLASTY     right knee X 2; left knee once  . LAMINECTOMY     X 6  . POSTERIOR CERVICAL FUSION/FORAMINOTOMY  01/28/2012   Procedure: POSTERIOR CERVICAL FUSION/FORAMINOTOMY LEVEL 3;  Surgeon: [name], MD;  Location: [facility];  Service: Neurosurgery;  Laterality: Left;  Posterior Cervical Five-Thoracic One  Fusion, Arthrodesis with LEFT Cervical Seven-thoracic One Laminectomy, Foraminotomy and Resection of Synovial Cyst  . POSTERIOR CERVICAL FUSION/FORAMINOTOMY N/A 01/29/2013   Procedure: POSTERIOR CERVICAL FUSION/FORAMINOTOMY LEVEL 1 and C2-5 Posteriolateral Arthrodesis;  Surgeon: [name], MD;  Location: [facility];  Service: Neurosurgery;  Laterality: N/A;  C2-C3 Laminectomy C2-C3 posterior cervical arthrodesis  . TONSILLECTOMY          Home Medications    Prior to Admission medications   Medication Sig Start Date End Date  Taking? Authorizing Provider  acetaminophen (TYLENOL) 325 MG tablet Take 650 mg by mouth every 4 (four) hours as needed for mild pain.    [provider]  alendronate (FOSAMAX) 70 MG tablet Take 70 mg by mouth once a week.  01/06/12   [provider]  Amino Acids-Protein Hydrolys (FEEDING SUPPLEMENT, PRO-STAT SUGAR FREE 64,) LIQD Take 30 mLs by mouth 2 (two) times daily.    [provider]  apixaban (ELIQUIS) 5 MG TABS tablet Take 1 tablet (5 mg total) by mouth 2 (two) times daily. 06/28/17   Skeet Latch, MD  calcium carbonate (OS-CAL - DOSED IN MG OF ELEMENTAL CALCIUM) 1250 (500 Ca) MG tablet Take 1 tablet (1,250 mg total) by mouth 2 (two) times daily with a meal. 06/01/17   Cristal Ford, DO  Cholecalciferol (VITAMIN D-3) 1000 units CAPS Take 1,000 Units by mouth 2 (two) times daily.    [provider]  Coenzyme Q10 (COQ10) 200 MG CAPS Take 200 mg by mouth at bedtime.    [provider]  collagenase (SANTYL) ointment Apply topically daily. 06/02/17   Mikhail, Velta Addison, DO  cyanocobalamin 500 MCG tablet Take 500 mcg by mouth at bedtime.    [provider]  diltiazem (CARDIZEM CD) 300 MG 24 hr capsule Take 1 capsule (300 mg total) by mouth daily. 06/28/17   Skeet Latch, MD  divalproex (DEPAKOTE) 250 MG DR tablet Take 3 tablets (750 mg total) by mouth every 12 (twelve) hours. Patient taking differently: Take 750 mg by mouth 2 (two) times daily.  07/09/15   Robbie Lis, MD  docusate sodium (COLACE) 100 MG capsule Take 200 mg by mouth 2 (two) times daily.     [provider]  doxazosin (CARDURA) 4 MG tablet Take 1 tablet (4 mg total) by mouth daily. 06/28/17   Skeet Latch, MD  feeding supplement, ENSURE ENLIVE, (ENSURE ENLIVE) LIQD Take 237 mLs by mouth 3 (three) times daily between meals. 06/01/17   Cristal Ford, DO  ferrous sulfate 325 (65 FE) MG tablet Take 1 tablet (325 mg total) by mouth 2 (two) times daily with a  meal. 06/28/17   Skeet Latch, MD  furosemide (LASIX) 20 MG tablet Take 1 tablet (20 mg total) by mouth daily. 06/21/17   Skeet Latch, MD  furosemide (LASIX) 20 MG tablet Take 1 tablet (20 mg total) by mouth daily. 06/28/17 09/26/17  Skeet Latch, MD  gabapentin (NEURONTIN) 300 MG capsule Take 300 mg by mouth 2 (two) times daily.    [provider]  HYDROcodone-acetaminophen (NORCO) 10-325 MG tablet Take 1 tablet by mouth every 6 (six) hours as needed for moderate pain. Patient taking differently: Take 0.5 tablets by mouth every 6 (six) hours as needed for moderate pain.  04/25/17   Lavina Hamman, MD  levothyroxine (SYNTHROID, LEVOTHROID) 150 MCG tablet Take 150 mcg by mouth daily before breakfast. 12/27/11   [provider]  lidocaine (LIDODERM) 5 % Place 1 patch onto the skin  daily as needed. Remove & Discard patch within 12 hours or as directed by MD Patient not taking: Reported on 06/21/2017 06/01/17   Cristal Ford, DO  metoprolol tartrate (LOPRESSOR) 25 MG tablet Take 1 tablet (25 mg total) by mouth 2 (two) times daily. 06/28/17   Skeet Latch, MD  Multiple Vitamin (MULTIVITAMIN WITH MINERALS) TABS tablet Take 1 tablet by mouth at bedtime.    [provider]  nitroGLYCERIN (NITROSTAT) 0.4 MG SL tablet Place 0.4 mg under the tongue every 5 (five) minutes as needed for chest pain. Reported on 07/20/2015    [provider]  omeprazole (PRILOSEC) 20 MG capsule Take 20 mg by mouth 2 (two) times daily before a meal.  12/23/11   [provider]  pravastatin (PRAVACHOL) 40 MG tablet Take 40 mg by mouth at bedtime.  10/29/14   [provider]  predniSONE (DELTASONE) 10 MG tablet Take 1 tablet (10 mg total) by mouth daily with breakfast. 04/28/17   Lavina Hamman, MD  senna (SENOKOT) 8.6 MG tablet Take 1 tablet by mouth 2 (two) times daily.     [provider]  vitamin A 8000 UNIT capsule Take 8,000 Units by mouth 2 (two)  times daily.    [provider]  vitamin C (ASCORBIC ACID) 500 MG tablet Take 500 mg by mouth every morning.    [provider]  ZETIA 10 MG tablet Take 5 mg by mouth at bedtime.  10/15/11   [provider]  zinc sulfate 220 (50 Zn) MG capsule Take 1 capsule (220 mg total) by mouth every morning. 06/02/17   Cristal Ford, DO    Family History Family History  Problem Relation Age of Onset  . Hypertension Mother   . Cancer Mother   . Kidney failure Father   . Heart disease Father     Social History Social History   Tobacco Use  . Smoking status: Former Research scientist (life sciences)  . Smokeless tobacco: Never Used  Substance Use Topics  . Alcohol use: No    Comment: rare  . Drug use: No     Allergies   Demerol [meperidine] and Keppra [levetiracetam]   Review of Systems Review of Systems  Unable to perform ROS: Dementia     Physical Exam Updated Vital Signs BP 104/68 (BP Location: Left Arm)   Pulse 69   Temp 97.8 F (36.6 C) (Oral)   Resp 18   SpO2 93%    BP (!) 95/54   Pulse 64   Temp 97.8 F (36.6 C) (Oral)   Resp 14   SpO2 95%    Physical Exam 1525: Physical examination:  Nursing notes reviewed; Vital signs and O2 SAT reviewed;  Constitutional: Well developed, Well nourished, In no acute distress; Head:  Normocephalic, atraumatic; Eyes: EOMI, PERRL, No scleral icterus; ENMT: Mouth and pharynx normal, Mucous membranes dry; Neck: Supple, Full range of motion, No lymphadenopathy; Cardiovascular: Irregular rate and rhythm, No gallop; Respiratory: Breath sounds clear & equal bilaterally, No wheezes.  Speaking full sentences with ease, Normal respiratory effort/excursion; Chest: Nontender, Movement normal; Abdomen: Soft, Nontender, Nondistended, Normal bowel sounds; Genitourinary: No CVA tenderness; Extremities: Peripheral pulses normal, No tenderness, +2 pedal edema bilat. No calf asymmetry.; Neuro: Awake, alert, confused re: place, events. Major CN grossly  intact. No facial droop. Speech clear. Grips equal. Strength 5/5 equal bilat UE's and LLE, 3/5 RLE (pt states this is normal for him). No apparent gross focal sensory deficits in extremities.; Skin: Color normal, Warm, Dry.  ED Treatments / Results  Labs (all labs ordered are listed, but only abnormal results are displayed)   EKG EKG Interpretation  Date/Time:  Monday July 01 2017 16:15:22 EDT Ventricular Rate:  62 PR Interval:    QRS Duration: 89 QT Interval:  430 QTC Calculation: 437 R Axis:   46 Text Interpretation:  Atrial fibrillation Low voltage, extremity leads Probable anteroseptal infarct, old When compared with ECG of 05/26/2017 No significant change was found Confirmed by Francine Graven [id number]) on 07/01/2017 4:19:42 PM   Radiology   Procedures Procedures (including critical care time)  Medications Ordered in ED Medications - No data to display   Initial Impression / Assessment and Plan / ED Course  I have reviewed the triage vital signs and the nursing notes.  Pertinent labs & imaging results that were available during my care of the patient were reviewed by me and considered in my medical decision making (see chart for details).  MDM Reviewed: previous chart, nursing note and vitals Reviewed previous: labs and ECG Interpretation: labs, ECG, x-ray and CT scan   Results for orders placed or performed during the hospital encounter of 07/01/17  Comprehensive metabolic panel  Result Value Ref Range   Sodium 146 (H) 135 - 145 mmol/L   Potassium 4.7 3.5 - 5.1 mmol/L   Chloride 109 101 - 111 mmol/L   CO2 27 22 - 32 mmol/L   Glucose, Bld 121 (H) 65 - 99 mg/dL   BUN 30 (H) 6 - 20 mg/dL   Creatinine, Ser 1.51 (H) 0.61 - 1.24 mg/dL   Calcium 8.7 (L) 8.9 - 10.3 mg/dL   Total Protein 5.5 (L) 6.5 - 8.1 g/dL   Albumin 3.2 (L) 3.5 - 5.0 g/dL   AST 14 (L) 15 - 41 U/L   ALT 15 (L) 17 - 63 U/L   Alkaline Phosphatase 44 38 - 126 U/L   Total Bilirubin 0.6 0.3 - 1.2  mg/dL   GFR calc non Af Amer 42 (L) >60 mL/min   GFR calc Af Amer 48 (L) >60 mL/min   Anion gap 10 5 - 15  Acetaminophen level  Result Value Ref Range   Acetaminophen (Tylenol), Serum <10 (L) 10 - 30 ug/mL  Ethanol  Result Value Ref Range   Alcohol, Ethyl (B) <02 <72 mg/dL  Salicylate level  Result Value Ref Range   Salicylate Lvl <5.3 2.8 - 30.0 mg/dL  Troponin I  Result Value Ref Range   Troponin I 0.07 (HH) <0.03 ng/mL  Lactic acid, plasma  Result Value Ref Range   Lactic Acid, Venous 1.6 0.5 - 1.9 mmol/L  CBC with Differential  Result Value Ref Range   WBC 5.7 4.0 - 10.5 K/uL   RBC 3.10 (L) 4.22 - 5.81 MIL/uL   Hemoglobin 9.6 (L) 13.0 - 17.0 g/dL   HCT 31.4 (L) 39.0 - 52.0 %   MCV 101.3 (H) 78.0 - 100.0 fL   MCH 31.0 26.0 - 34.0 pg   MCHC 30.6 30.0 - 36.0 g/dL   RDW 16.1 (H) 11.5 - 15.5 %   Platelets 116 (L) 150 - 400 K/uL   Neutrophils Relative % 86 %   Neutro Abs 5.0 1.7 - 7.7 K/uL   Lymphocytes Relative 8 %   Lymphs Abs 0.5 (L) 0.7 - 4.0 K/uL   Monocytes Relative 5 %   Monocytes Absolute 0.3 0.1 - 1.0 K/uL   Eosinophils Relative 1 %   Eosinophils Absolute 0.0 0.0 - 0.7 K/uL  Basophils Relative 0 %   Basophils Absolute 0.0 0.0 - 0.1 K/uL  Protime-INR  Result Value Ref Range   Prothrombin Time 17.8 (H) 11.4 - 15.2 seconds   INR 1.48   Urine rapid drug screen (hosp performed)  Result Value Ref Range   Opiates POSITIVE (A) NONE DETECTED   Cocaine NONE DETECTED NONE DETECTED   Benzodiazepines NONE DETECTED NONE DETECTED   Amphetamines NONE DETECTED NONE DETECTED   Tetrahydrocannabinol NONE DETECTED NONE DETECTED   Barbiturates NONE DETECTED NONE DETECTED  Urinalysis, Routine w reflex microscopic  Result Value Ref Range   Color, Urine YELLOW YELLOW   APPearance CLEAR CLEAR   Specific Gravity, Urine 1.016 1.005 - 1.030   pH 5.0 5.0 - 8.0   Glucose, UA NEGATIVE NEGATIVE mg/dL   Hgb urine dipstick NEGATIVE NEGATIVE   Bilirubin Urine NEGATIVE NEGATIVE    Ketones, ur NEGATIVE NEGATIVE mg/dL   Protein, ur NEGATIVE NEGATIVE mg/dL   Nitrite NEGATIVE NEGATIVE   Leukocytes, UA NEGATIVE NEGATIVE  Brain natriuretic peptide  Result Value Ref Range   B Natriuretic Peptide 345.9 (H) 0.0 - 100.0 pg/mL  Valproic acid level  Result Value Ref Range   Valproic Acid Lvl 60 50.0 - 100.0 ug/mL   Dg Chest 2 View Result Date: 07/01/2017 CLINICAL DATA:  Altered mental status, and confusion. EXAM: CHEST - 2 VIEW COMPARISON:  05/27/2017. FINDINGS: The heart is enlarged. BILATERAL lower lobe pulmonary opacities have progressed from prior radiograph, likely represent combination of atelectasis, effusion, and infiltrate. Old RIGHT-sided rib fractures are stable. Worsening aeration from priors. IMPRESSION: Worsening aeration. Cardiomegaly. Lower lobe opacities are increased. Electronically Signed   By: Staci Righter M.D.   On: 07/01/2017 15:52   Ct Head Wo Contrast Result Date: 07/01/2017 CLINICAL DATA:  Patient coming from home with c/o altered mental status. Patient wife call EMS stated patient been very hard to arouse. Pt did wake up little but was confused. Per ems pt been alert and oriented x 4. EXAM: CT HEAD WITHOUT CONTRAST TECHNIQUE: Contiguous axial images were obtained from the base of the skull through the vertex without intravenous contrast. COMPARISON:  04/17/2017 FINDINGS: Brain: No evidence of acute infarction, hemorrhage, extra-axial collection, ventriculomegaly, or mass effect. Generalized cerebral atrophy. Periventricular white matter low attenuation likely secondary to microangiopathy. Vascular: Cerebrovascular atherosclerotic calcifications are noted. Skull: Negative for fracture or focal lesion. Sinuses/Orbits: Visualized portions of the orbits are unremarkable. Visualized portions of the paranasal sinuses and mastoid air cells are unremarkable. Other: None. IMPRESSION: 1. No acute intracranial pathology. 2. Chronic microvascular disease and cerebral  atrophy. Electronically Signed   By: Kathreen Devoid   On: 07/01/2017 15:51    2831:  Pt unable to stand for orthostatic VS.   H/H per baseline. BUN/Cr elevated and BP soft; judicious IVF given. Worsening infiltrates on CXR; HCAP abx started.  T/C returned from [facility] Dr. Sarajane Jews, case discussed, including:  HPI, pertinent PM/SHx, VS/PE, dx testing, ED course and treatment:  Agreeable to admit.     Final Clinical Impressions(s) / ED Diagnoses   Final diagnoses:  None    ED Discharge Orders    None       Francine Graven, DO 07/03/17 1825

## 2017-07-01 NOTE — Progress Notes (Signed)
Pharmacy Antibiotic Note  [name] is a 84 y.o. male admitted on 07/01/2017 with aspiration PNA.  Pharmacy has been consulted for ampicillin/sulbactam dosing.  Plan: Ampicillin/sulbactam 3 g IV q6h  Temp (24hrs), Avg:97.8 F (36.6 C), Min:97.8 F (36.6 C), Max:97.8 F (36.6 C)  Recent Labs  Lab 06/28/17 1118 07/01/17 1524 07/01/17 1612  WBC  --   --  5.7  CREATININE 1.31*  --  1.51*  LATICACIDVEN  --  1.6  --     Estimated Creatinine Clearance: 39.6 mL/min (A) (by C-G formula based on SCr of 1.51 mg/dL (H)).    Allergies  Allergen Reactions  . Demerol [Meperidine] Nausea And Vomiting  . Keppra [Levetiracetam] Other (See Comments)    Causes sleepiness   Antimicrobials this admission: Ampicillin/sulbactam 4/22 >>  Cefepime + vancomycin 4/22 one time ED doses  Dose adjustments this admission:  Microbiology results: 4/22 UCx: Sent   Thank you for allowing pharmacy to be a part of this patient's care.  Lenis Noon, PharmD, BCPS 07/01/2017 8:04 PM

## 2017-07-01 NOTE — ED Notes (Signed)
Patient transported to CT 

## 2017-07-01 NOTE — Telephone Encounter (Signed)
New message  Pt wife verbalzied that she is calling for RN  She wants labs to be called over to [facility] at [place] told her that I will send a message for the rn to return the call for the results   Pt wife then verbalzied that her husband has been incoherent since he woke up and I advised her to call 911   She said that a home health RN is scheduled to come out to today but she thanked me and agree to call EMS

## 2017-07-01 NOTE — Progress Notes (Signed)
PHARMACY NOTE:  ANTIMICROBIAL RENAL DOSAGE ADJUSTMENT  Current antimicrobial regimen includes a mismatch between antimicrobial dosage and estimated renal function.  As per policy approved by the Pharmacy & Therapeutics and Medical Executive Committees, the antimicrobial dosage will be adjusted accordingly.  Current antimicrobial dosage:  Cefepime 1 g IV q8h  Indication: PNA  Renal Function:  Estimated Creatinine Clearance: 39.6 mL/min (A) (by C-G formula based on SCr of 1.51 mg/dL (H)).    Antimicrobial dosage has been changed to:  Cefepime 1 g IV q12h  Additional Comments: A consult was received from an ED physician for vancomycin per pharmacy dosing.  The patient's profile has been reviewed for ht/wt/allergies/indication/available labs.   A one time order has been placed for 1000 mg.  Further antibiotics/pharmacy consults should be ordered by admitting physician if indicated.                       Thank you for allowing pharmacy to be a part of this patient's care.  Lenis Noon, [facility] 07/01/2017 5:42 PM

## 2017-07-02 ENCOUNTER — Inpatient Hospital Stay (HOSPITAL_COMMUNITY): Payer: Medicare Other

## 2017-07-02 DIAGNOSIS — Z7952 Long term (current) use of systemic steroids: Secondary | ICD-10-CM | POA: Diagnosis not present

## 2017-07-02 DIAGNOSIS — I5033 Acute on chronic diastolic (congestive) heart failure: Secondary | ICD-10-CM | POA: Diagnosis present

## 2017-07-02 DIAGNOSIS — G9341 Metabolic encephalopathy: Secondary | ICD-10-CM | POA: Diagnosis present

## 2017-07-02 DIAGNOSIS — Z8249 Family history of ischemic heart disease and other diseases of the circulatory system: Secondary | ICD-10-CM | POA: Diagnosis not present

## 2017-07-02 DIAGNOSIS — Z515 Encounter for palliative care: Secondary | ICD-10-CM | POA: Diagnosis not present

## 2017-07-02 DIAGNOSIS — Z66 Do not resuscitate: Secondary | ICD-10-CM | POA: Diagnosis present

## 2017-07-02 DIAGNOSIS — D696 Thrombocytopenia, unspecified: Secondary | ICD-10-CM | POA: Diagnosis present

## 2017-07-02 DIAGNOSIS — Z79890 Hormone replacement therapy (postmenopausal): Secondary | ICD-10-CM | POA: Diagnosis not present

## 2017-07-02 DIAGNOSIS — Z7901 Long term (current) use of anticoagulants: Secondary | ICD-10-CM | POA: Diagnosis not present

## 2017-07-02 DIAGNOSIS — N179 Acute kidney failure, unspecified: Secondary | ICD-10-CM | POA: Diagnosis present

## 2017-07-02 DIAGNOSIS — G8929 Other chronic pain: Secondary | ICD-10-CM | POA: Diagnosis present

## 2017-07-02 DIAGNOSIS — E870 Hyperosmolality and hypernatremia: Secondary | ICD-10-CM | POA: Diagnosis present

## 2017-07-02 DIAGNOSIS — E7800 Pure hypercholesterolemia, unspecified: Secondary | ICD-10-CM | POA: Diagnosis present

## 2017-07-02 DIAGNOSIS — I2510 Atherosclerotic heart disease of native coronary artery without angina pectoris: Secondary | ICD-10-CM | POA: Diagnosis present

## 2017-07-02 DIAGNOSIS — G40909 Epilepsy, unspecified, not intractable, without status epilepticus: Secondary | ICD-10-CM | POA: Diagnosis present

## 2017-07-02 DIAGNOSIS — D638 Anemia in other chronic diseases classified elsewhere: Secondary | ICD-10-CM | POA: Diagnosis present

## 2017-07-02 DIAGNOSIS — I5032 Chronic diastolic (congestive) heart failure: Secondary | ICD-10-CM | POA: Diagnosis not present

## 2017-07-02 DIAGNOSIS — J690 Pneumonitis due to inhalation of food and vomit: Secondary | ICD-10-CM | POA: Diagnosis present

## 2017-07-02 DIAGNOSIS — E785 Hyperlipidemia, unspecified: Secondary | ICD-10-CM | POA: Diagnosis present

## 2017-07-02 DIAGNOSIS — I110 Hypertensive heart disease with heart failure: Secondary | ICD-10-CM | POA: Diagnosis present

## 2017-07-02 DIAGNOSIS — J189 Pneumonia, unspecified organism: Secondary | ICD-10-CM | POA: Diagnosis not present

## 2017-07-02 DIAGNOSIS — K219 Gastro-esophageal reflux disease without esophagitis: Secondary | ICD-10-CM | POA: Diagnosis present

## 2017-07-02 DIAGNOSIS — Z87891 Personal history of nicotine dependence: Secondary | ICD-10-CM | POA: Diagnosis not present

## 2017-07-02 DIAGNOSIS — E039 Hypothyroidism, unspecified: Secondary | ICD-10-CM | POA: Diagnosis present

## 2017-07-02 DIAGNOSIS — M545 Low back pain: Secondary | ICD-10-CM | POA: Diagnosis present

## 2017-07-02 DIAGNOSIS — R748 Abnormal levels of other serum enzymes: Secondary | ICD-10-CM | POA: Diagnosis not present

## 2017-07-02 DIAGNOSIS — Z7189 Other specified counseling: Secondary | ICD-10-CM | POA: Diagnosis not present

## 2017-07-02 DIAGNOSIS — F0390 Unspecified dementia without behavioral disturbance: Secondary | ICD-10-CM | POA: Diagnosis present

## 2017-07-02 DIAGNOSIS — I480 Paroxysmal atrial fibrillation: Secondary | ICD-10-CM | POA: Diagnosis present

## 2017-07-02 DIAGNOSIS — G9340 Encephalopathy, unspecified: Secondary | ICD-10-CM | POA: Diagnosis not present

## 2017-07-02 LAB — BASIC METABOLIC PANEL
Anion gap: 12 (ref 5–15)
BUN: 31 mg/dL — ABNORMAL HIGH (ref 6–20)
CO2: 24 mmol/L (ref 22–32)
Calcium: 8.4 mg/dL — ABNORMAL LOW (ref 8.9–10.3)
Chloride: 113 mmol/L — ABNORMAL HIGH (ref 101–111)
Creatinine, Ser: 1.41 mg/dL — ABNORMAL HIGH (ref 0.61–1.24)
GFR calc Af Amer: 52 mL/min — ABNORMAL LOW (ref >60)
GFR calc non Af Amer: 45 mL/min — ABNORMAL LOW (ref >60)
Glucose, Bld: 111 mg/dL — ABNORMAL HIGH (ref 65–99)
Potassium: 4.3 mmol/L (ref 3.5–5.1)
Sodium: 149 mmol/L — ABNORMAL HIGH (ref 135–145)

## 2017-07-02 LAB — URINE CULTURE: Culture: NO GROWTH

## 2017-07-02 LAB — CBC
HCT: 31.1 % — ABNORMAL LOW (ref 39.0–52.0)
Hemoglobin: 9.7 g/dL — ABNORMAL LOW (ref 13.0–17.0)
MCH: 31.2 pg (ref 26.0–34.0)
MCHC: 31.2 g/dL (ref 30.0–36.0)
MCV: 100 fL (ref 78.0–100.0)
Platelets: 113 10*3/uL — ABNORMAL LOW (ref 150–400)
RBC: 3.11 MIL/uL — ABNORMAL LOW (ref 4.22–5.81)
RDW: 16 % — ABNORMAL HIGH (ref 11.5–15.5)
WBC: 4.7 10*3/uL (ref 4.0–10.5)

## 2017-07-02 LAB — MRSA PCR SCREENING: MRSA by PCR: NEGATIVE

## 2017-07-02 LAB — STREP PNEUMONIAE URINARY ANTIGEN: Strep Pneumo Urinary Antigen: NEGATIVE

## 2017-07-02 MED ORDER — JUVEN PO PACK
1.0000 | PACK | Freq: Two times a day (BID) | ORAL | Status: DC
  Administered 2017-07-02 – 2017-07-03 (×2): 1 via ORAL
  Filled 2017-07-02 (×7): qty 1

## 2017-07-02 MED ORDER — PRO-STAT SUGAR FREE PO LIQD
30.0000 mL | Freq: Two times a day (BID) | ORAL | Status: DC
Start: 2017-07-02 — End: 2017-07-05
  Administered 2017-07-02 – 2017-07-04 (×4): 30 mL via ORAL
  Filled 2017-07-02 (×7): qty 30

## 2017-07-02 MED ORDER — ORAL CARE MOUTH RINSE
15.0000 mL | Freq: Two times a day (BID) | OROMUCOSAL | Status: DC
  Administered 2017-07-02 – 2017-07-04 (×6): 15 mL via OROMUCOSAL

## 2017-07-02 MED ORDER — LACTATED RINGERS IV SOLN
INTRAVENOUS | Status: DC
  Administered 2017-07-02 – 2017-07-03 (×2): via INTRAVENOUS

## 2017-07-02 NOTE — Evaluation (Signed)
SLP Cancellation Note  Patient Details Name: [name] MRN: [id number] DOB: 12-09-1933   Cancelled treatment:       Reason Eval/Treat Not Completed: Other (comment)(spoke to Dr Sarajane Jews and advised pt seen twice by SLP for clinical evals in the past, MBS indicated - MD agreeable to plan)   Macario Golds 07/02/2017, 10:25 AM Luanna Salk, [facility] SLP [phone]

## 2017-07-02 NOTE — Plan of Care (Signed)
Reviewed plan of care with pt and spouse. Spouse attentive and verbalized understanding, pt drowsy and will need continued education.

## 2017-07-02 NOTE — Progress Notes (Signed)
Initial Nutrition Assessment  DOCUMENTATION CODES:   Not applicable  INTERVENTION:  - Will order Juven BID, each packet provides 80 calories and 14 grams of amino acids; contains CaHMB, glutamine, and arginine to promote wound healing. - Will order 30 mL Prostat BID, each supplement provides 100 kcal and 15 grams of protein. - Continue to encourage PO intakes. - RD provided pt's wife with handout from the [employer] outlining foods that are appropriate and inappropriate for Dysphagia 3 diet.   NUTRITION DIAGNOSIS:   Increased nutrient needs related to wound healing as evidenced by estimated needs.  GOAL:   Patient will meet greater than or equal to 90% of their needs  MONITOR:   PO intake, Supplement acceptance, Weight trends, Labs, Skin  REASON FOR ASSESSMENT:   Consult Wound healing  ASSESSMENT:   84 year old man PMH coronary artery disease, atrial fibrillation, seizure disorder, dementia, presented to the ED with confusion.  Initial evaluation suggested pneumonia, possibly aspiration the patient was admitted for further evaluation.  BMI indicates overweight status, appropriate for age. Per flowsheet, pt consumed 25% of breakfast and 75% of lunch today. Pt was sleeping at the time of RD visit (this limited NFPE, as shown below). In flow sheet, pt noted to be a/o to self only.   Pt's wife was at bedside at the time of RD visit. She reports that this is pt's third hospitalization for pneumonia. She reports that pt has a good appetite at home with no recent changes; she lists many of his favorite foods/snacks.   SLP saw pt earlier today for MBS and recommends Dysphagia 3, thin liquids. Sheet already hung at the head of the bed but order has not yet been changed in Epic. RD talked with pt's wife about appropriate and inappropriate foods on this diet and also provided her with a handout. 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   Per chart review, pt has had fluctuations in weight over the past 2 years. Recent changes are not concerning. Wife reports that pt was previously ordered Prostat and Juven at rehab and that she and pt noted improvement in wound healing with these supplements. She also started incorporating more nuts into his diet after d/c from rehab to increase protein intake.   Wt Readings from Last 10 Encounters:  07/01/17 181 lb (82.1 kg)  06/21/17 189 lb (85.7 kg)  06/01/17 168 lb 14 oz (76.6 kg)  05/02/17 177 lb (80.3 kg)  04/25/17 176 lb 12.9 oz (80.2 kg)  09/22/15 195 lb (88.5 kg)  07/20/15 194 lb 8 oz (88.2 kg)  07/11/15 212 lb (96.2 kg)  05/10/15 196 lb 9.6 oz (89.2 kg)  05/09/15 190 lb (86.2 kg)    Medications reviewed; 20 mg oral Lasix/day, 40 mg oral Protonix/day, 10 mg Deltasone/day, 1 tablet Senokot BID.  Labs reviewed; Na: 149 mmol/L, Cl: 113 mmol/L, BUN: 31 mg/dL, creatinine: 1.41 mg/dL, Ca: 8.4 mg/dL, GFR: 45 mL/min.   IVF: LR @ 75 mL/hr.      NUTRITION - FOCUSED PHYSICAL EXAM:    Most Recent Value  Orbital Region  No depletion  Upper Arm Region  No depletion  Thoracic and Lumbar Region  No depletion  Buccal Region  No depletion  Temple Region  No depletion  Clavicle Bone Region  No depletion  Clavicle and Acromion Bone Region  No depletion  Scapular  Bone Region  Unable to assess  Dorsal Hand  Unable to assess  Patellar Region  Unable to assess  Anterior Thigh Region  Unable to assess  Posterior Calf Region  Unable to assess  Edema (RD Assessment)  Unable to assess  Hair  Reviewed  Eyes  Unable to assess  Mouth  Unable to assess  Skin  Reviewed  Nails  Unable to assess       Diet Order:  DIET DYS 2 Room service appropriate? Yes; Fluid consistency: Thin Aspiration precautions  EDUCATION NEEDS:   Education needs have been addressed  Skin:  Skin Assessment: Skin Integrity Issues: Skin Integrity Issues::  Other (Comment) Other: RLE venous stasis ulcer  Last BM:  4/23  Height:   Ht Readings from Last 1 Encounters:  07/01/17 5\' 10"  (1.778 m)    Weight:   Wt Readings from Last 1 Encounters:  07/01/17 181 lb (82.1 kg)    Ideal Body Weight:  75.45 kg  BMI:  Body mass index is 25.97 kg/m.  Estimated Nutritional Needs:   Kcal:  1725-1970 (21-24 kcal/kg)  Protein:  80-100 grams (1-1.2 grams/kg)  Fluid:  >/= 1.8 L/day      Jarome Matin, MS, RD, LDN, [facility] Inpatient Clinical Dietitian Pager # [phone] After hours/weekend pager # [phone]

## 2017-07-02 NOTE — Progress Notes (Signed)
Modified Barium Swallow Progress Note  Patient Details  Name: [name] MRN: [id number] Date of Birth: [birth year]  Today's Date: 07/02/2017  Modified Barium Swallow completed.  Full report located under Chart Review in the Imaging Section.  Brief recommendations include the following:  Clinical Impression  Suboptimal view of MBS due to pt's shoulders blocking some portion of pharynx/larynx and trachea.  Mild oropharyngeal dysphagia without aspiration or penetration of any consistency tested.     Pt did have decreased oral coordination/propulsion with barium tablet taken with thin resulting in barium tablet precariously lodging at vallecular space.  Pt was cued NOT to talk and provided with more thin to aid transit.  Tablet then lodged at proximal esophagus (near area of hardware) with minimal backflow of tablet, however pt able to clear with reflexive further swallow.  Suspect primary aspiration risk is due to pharyngo-cervical esophageal and probable esophageal dysphagia.    Recommend pill be given with pudding *start and follow with liquids* to decrease aspiration risk.  Pt did appear with marginally slow clearance of esophagus, liquids appeared to aid.  Pt also is kyphotic - and suspect this contributes to his deficits.  Using video monitor and teach back, educated pt and wife to findings/recommendations.  Of note, wife requested barium swallow to examine esophagus.    Swallow Evaluation Recommendations   Recommended Consults: Other (Comment)(wife requests barium swallow)   SLP Diet Recommendations: Dysphagia 3 (Mech soft) solids;Thin liquid   Liquid Administration via: Cup;Straw   Medication Administration: Whole meds with puree(start and follow with liquids)   Supervision: Patient able to self feed;Staff to assist with self feeding   Compensations: Slow rate;Small sips/bites;Follow solids with liquid   Postural Changes: Seated upright at 90 degrees;Remain semi-upright after  after feeds/meals (Comment)   Oral Care Recommendations: Oral care BID       Luanna Salk, MS [facility] SLP [phone]  Macario Golds 07/02/2017,12:39 PM

## 2017-07-02 NOTE — Consult Note (Signed)
[place] Nurse wound consult note Reason for Consult: Chronic (>1 year), nonhealing wounds on the LEs.  Patient is closely followed by [facility] and the outpatient [facility] by Dr. Laurene Footman. Wound type: Suspected mixed etiology Pressure Injury POA: NA Measurement: Right medial LE:  2.2cm x 0.8cm x 0.4cm Right medial malleolus:  0.4cm round x 0.2cm Right posterior LE:  0.2cm x 0.4cm intact area presenting as a 0.2cm depression with purple discoloration. Several pinpoint darkened areas on the bilateral LEs and feet. Wound bed: 80% red, 20% yellow slough Drainage (amount, consistency, odor) scant serous Periwound: macerated to 1cm from collagenase lying atop the intact skin. Dressing procedure/placement/frequency: Outpatient [place] changes topical therapies between silver hydrofiber and enzymatic debriding agent (collagenase/Santyl). At this time, there is a 1cm area of maceration surrounding the larger wound on the RLE, so I have discussed with the patient's wife how we may need to use the more absorptive dressing now and return to the enzymatic debriding agent later.  She is in agreement with this POC. She also expressed the desire to have an RD consult for maximizing protein. Of note: she has obtained a consultation at [facility] with the dermatology clinic for additional information regarding the etiology of the wounds and other suggestions for wound healing.  [place] nursing team will not follow, but will remain available to this patient, the nursing and medical teams.  Please re-consult if needed. Thanks, Maudie Flakes, MSN, RN, [place], Arther Abbott  Pager# [phone]

## 2017-07-02 NOTE — Progress Notes (Signed)
Patient has not voided this shift. Patient denies urge, no bladder distention noted. Repeated bladder scans read 61mL. Md aware, rec'd order for IVF and to monitor. Repeated page at this time. Rec'd order for I&O cath.

## 2017-07-02 NOTE — Progress Notes (Signed)
Dressing change complete to right ankle wound. Received verbal instructions from [facility] RN to place Aquacel AG over wound, cover with ABD and fluff wrap, tape in place. While performing dressing change, patient's wife expressed concern and anxiety that dressing would not stay in place and aquacel would be lost. Discussed options with patient's wife, decided to place aquacel ag and cover with pink silicone dressing as she reports the patient has tolerated those dressings in the past. Patient tolerated well. Assisted up to chair at bedside for meal. Patient's wife assisting with feeding, following ST instructions. Will cont to monitor.

## 2017-07-02 NOTE — Progress Notes (Signed)
PROGRESS NOTE  [name] DGU:[id number] DOB: November 12, 1933 DOA: 07/01/2017 PCP: [name], MD  Brief Narrative: 84 year old man PMH coronary artery disease, atrial fibrillation, seizure disorder, dementia, presented to the emergency department with confusion.  Initial evaluation suggested pneumonia, possibly aspiration the patient was admitted for further evaluation.  Assessment/Plan Suspected pneumonia, history suggest aspiration, with associated acute encephalopathy.. --Remains afebrile, without hypoxia but still confused.   --Continue empiric Unasyn.  Follow-up speech therapy consultation.  Continue aspiration Precautions.   --No change in confusion.  Acute kidney injury versus dehydration --Renal function modestly improved.  Likely slightly elevated from increased Lasix dosing as an outpatient. --Follow clinically.  Repeat BMP in a.m.  Still has peripheral edema we will continue Lasix.  Thrombocytopenia, acute --Secondary to acute infection.  Platelet count stable.  Chronic diastolic heart failure --Peripheral edema without significant change.  Continue Lasix, beta-blocker, calcium channel blocker.  Anemia of chronic disease.  Remained stable.  Chronic troponin elevation --Asymptomatic.  EKG nonacute.  No further evaluation suggested  Paroxysmal atrial fibrillation --Stable.   Continue apixaban, diltiazem, metoprolol.  Seizure disorder --Stable.    Continue Depakote.  Chronic right lower extremity wounds present on admission. --Appear stable.  No evidence of secondary infection.  Wound care.  Dementia --Per wife this is minor.   Patient appears about the same today he remains confused and I suspect his oral intake will be poor.  He is not ready for discharge, change to inpatient status.  This is his third hospitalization in 3 months, he is declining cognitively and physically.  Consult physical therapy occupational therapy and reconsult palliative  medicine for further discussion of goals of care.  DVT prophylaxis: Apixaban Code Status: Full Family Communication: Wife at bedside Disposition Plan: Home, wife does not want him to go back to a skilled facility   Murray Hodgkins, MD  [facility] Direct contact: [phone] --[place]  --[email or web address]; password [email or web address]  7PM-7AM contact night coverage as above 07/02/2017, 9:53 AM  LOS: 0 days   Consultants:    Procedures:    Antimicrobials:  Unasyn 4/22 >>  Interval history/Subjective: About the same today.  Remains confused.  History unreliable.  Objective: Vitals:  Vitals:   07/02/17 0604 07/02/17 0932  BP: 122/72 122/66  Pulse: 71 78  Resp: 11 16  Temp: 97.7 F (36.5 C) 98.5 F (36.9 C)  SpO2: 99% 97%    Exam:  Constitutional:  . Appears calm and comfortable Eyes:  . pupils and irises appear normal . Normal lids  ENMT:  . grossly normal hearing  . Lips appear normal Respiratory:  . CTA bilaterally, no w/r/r.  . Respiratory effort normal.  Cardiovascular:  . RRR, no m/r/g . 2+ bilateral LE extremity edema   Abdomen:  . Soft, nontender Skin:  . Wounds right lower extremity noted.  2 ulcers, relatively shallow, no exudate or surrounding erythema. Psychiatric:  . Mental status o Confused.  Difficult to assess mood and affect. . Judgment and insight appear impaired.   I have personally reviewed the following:   Labs:  Creatinine improved, 1.41.  Hemoglobin and platelet count stable, 9.7 and 113 respectively.   Scheduled Meds: . apixaban  5 mg Oral BID  . collagenase   Topical Daily  . diltiazem  300 mg Oral Daily  . divalproex  250 mg Oral Daily   And  . divalproex  750 mg Oral QHS  . doxazosin  4 mg Oral Daily  . furosemide  20 mg Oral  Daily  . gabapentin  300 mg Oral BID  . levothyroxine  150 mcg Oral QAC breakfast  . mouth rinse  15 mL Mouth Rinse BID  . metoprolol tartrate  25 mg Oral BID  . pantoprazole  40 mg  Oral Daily  . predniSONE  10 mg Oral Q breakfast  . senna  1 tablet Oral BID  . sodium chloride flush  3 mL Intravenous Q12H   Continuous Infusions: . sodium chloride    . ampicillin-sulbactam (UNASYN) IV Stopped (07/02/17 0500)    Active Problems:   Seizure disorder (HCC)   Memory difficulty   Aspiration pneumonia (HCC)   Acute encephalopathy   Thrombocytopenia (HCC)   Chronic diastolic CHF (congestive heart failure) (HCC)   Anemia of chronic disease   Troponin level elevated   PAF (paroxysmal atrial fibrillation) ([place])   LOS: 0 days

## 2017-07-02 NOTE — Evaluation (Signed)
Physical Therapy Evaluation Patient Details Name: [name] MRN: [id number] DOB: May 12, 1933 Today's Date: 07/02/2017   History of Present Illness  84 year old man PMH coronary artery disease, atrial fibrillation, seizure disorder, dementia, presented to the emergency department with confusion. Initial evaluation suggested pneumonia, possibly aspiration the patient was admitted for further evaluation.  Clinical Impression  Pt admitted with above diagnosis. Pt currently with functional limitations due to the deficits listed below (see PT Problem List).  Pt fatigues rapidly, extremely dyspneic after bed to chair transfer--SpO2=91-93% on RA, HR 70s; pt unable to amb at this time d/t weakness, SOB; wife declines SNF, will likely need a few more sessions of PT prior to D/C; recommend HHPT  Pt will benefit from skilled PT to increase their independence and safety with mobility to allow discharge to the venue listed below.       Follow Up Recommendations Home health PT;Supervision/Assistance - 24 hour    Equipment Recommendations  None recommended by PT    Recommendations for Other Services       Precautions / Restrictions Precautions Precautions: Fall Restrictions Weight Bearing Restrictions: No      Mobility  Bed Mobility Overal bed mobility: Needs Assistance Bed Mobility: Supine to Sit     Supine to sit: Min assist     General bed mobility comments: assist to elevate trunk, incr time  Transfers Overall transfer level: Needs assistance Equipment used: Rolling walker (2 wheeled) Transfers: Sit to/from [facility] Sit to Stand: Min assist;+2 safety/equipment;+2 physical assistance Stand pivot transfers: Min assist;+2 safety/equipment;+2 physical assistance       General transfer comment: assist to rise and stabilize, 2 standing trials d/t pt fatigue, able to to stand ~ 5 seconds first trial; assist to wt shift and pivot to chair, +2 needed foro balance,  RW position and safety  Ambulation/Gait                Stairs            Wheelchair Mobility    Modified Rankin (Stroke Patients Only)       Balance Overall balance assessment: Needs assistance Sitting-balance support: Feet supported;Bilateral upper extremity supported Sitting balance-Leahy Scale: Fair     Standing balance support: Bilateral upper extremity supported;During functional activity Standing balance-Leahy Scale: Poor Standing balance comment: reliant on UEs and external support                              Pertinent Vitals/Pain Pain Assessment: No/denies pain    Home Living Family/patient expects to be discharged to:: Private residence Living Arrangements: Spouse/significant other Available Help at Discharge: Family;Available 24 hours/day Type of Home: House Home Access: Level entry(2 small steps)     Home Layout: One level Home Equipment: Walker - 4 wheels;Cane - single point;Bedside commode;Shower seat;Grab bars - tub/shower;Wheelchair - Banker      Prior Function Level of Independence: Needs assistance   Gait / Transfers Assistance Needed: was walking with RW  ADL's / Homemaking Assistance Needed: required min/modA for dressing and bathing        Hand Dominance        Extremity/Trunk Assessment   Upper Extremity Assessment Upper Extremity Assessment: Generalized weakness    Lower Extremity Assessment Lower Extremity Assessment: Generalized weakness       Communication      Cognition Arousal/Alertness: Awake/alert(sleepy) Behavior During Therapy: WFL for tasks assessed/performed Overall Cognitive Status: History of cognitive impairments -  at baseline                                 General Comments: pt reports cognition/confusion worse than pt baseline      General Comments      Exercises     Assessment/Plan    PT Assessment Patient needs continued PT services  PT  Problem List Decreased strength;Decreased range of motion;Decreased activity tolerance;Decreased mobility;Decreased balance;Cardiopulmonary status limiting activity;Decreased cognition       PT Treatment Interventions DME instruction;Gait training;Functional mobility training;Therapeutic activities;Therapeutic exercise;Patient/family education;Balance training    PT Goals (Current goals can be found in the Care Plan section)  Acute Rehab PT Goals Patient Stated Goal: pt to be stronger, go home PT Goal Formulation: With patient/family Time For Goal Achievement: 07/02/17 Potential to Achieve Goals: Good    Frequency Min 3X/week   Barriers to discharge        Co-evaluation               AM-PAC PT "6 Clicks" Daily Activity  Outcome Measure Difficulty turning over in bed (including adjusting bedclothes, sheets and blankets)?: Unable Difficulty moving from lying on back to sitting on the side of the bed? : Unable Difficulty sitting down on and standing up from a chair with arms (e.g., wheelchair, bedside commode, etc,.)?: Unable Help needed moving to and from a bed to chair (including a wheelchair)?: A Lot Help needed walking in hospital room?: Total Help needed climbing 3-5 steps with a railing? : Total 6 Click Score: 7    End of Session Equipment Utilized During Treatment: Gait belt Activity Tolerance: Patient tolerated treatment well;Patient limited by fatigue Patient left: in chair;with call bell/phone within reach;with family/visitor present;with chair alarm set   PT Visit Diagnosis: Unsteadiness on feet (R26.81);Difficulty in walking, not elsewhere classified (R26.2);Muscle weakness (generalized) (M62.81)    Time: 1541-1610 PT Time Calculation (min) (ACUTE ONLY): 29 min   Charges:   PT Evaluation $PT Eval Low Complexity: 1 Low PT Treatments $Therapeutic Activity: 8-22 mins   PT G CodesKenyon Ana, PT Pager:  [phone] 07/02/2017   [facility] 07/02/2017, 4:28 PM

## 2017-07-03 DIAGNOSIS — J690 Pneumonitis due to inhalation of food and vomit: Principal | ICD-10-CM

## 2017-07-03 DIAGNOSIS — Z7189 Other specified counseling: Secondary | ICD-10-CM

## 2017-07-03 DIAGNOSIS — Z515 Encounter for palliative care: Secondary | ICD-10-CM

## 2017-07-03 LAB — BASIC METABOLIC PANEL
Anion gap: 11 (ref 5–15)
BUN: 37 mg/dL — ABNORMAL HIGH (ref 6–20)
CO2: 24 mmol/L (ref 22–32)
Calcium: 8.3 mg/dL — ABNORMAL LOW (ref 8.9–10.3)
Chloride: 108 mmol/L (ref 101–111)
Creatinine, Ser: 1.46 mg/dL — ABNORMAL HIGH (ref 0.61–1.24)
GFR calc Af Amer: 50 mL/min — ABNORMAL LOW (ref >60)
GFR calc non Af Amer: 43 mL/min — ABNORMAL LOW (ref >60)
Glucose, Bld: 152 mg/dL — ABNORMAL HIGH (ref 65–99)
Potassium: 4.7 mmol/L (ref 3.5–5.1)
Sodium: 143 mmol/L (ref 135–145)

## 2017-07-03 MED ORDER — DOXAZOSIN MESYLATE 4 MG PO TABS: 4.0000 mg | ORAL_TABLET | Freq: Two times a day (BID) | ORAL | Status: DC

## 2017-07-03 MED ORDER — DOXAZOSIN MESYLATE 4 MG PO TABS
4.0000 mg | ORAL_TABLET | Freq: Every day | ORAL | Status: DC
  Administered 2017-07-04 – 2017-07-05 (×2): 4 mg via ORAL
  Filled 2017-07-03 (×2): qty 1

## 2017-07-03 MED ORDER — FUROSEMIDE 20 MG PO TABS: 20.0000 mg | ORAL_TABLET | Freq: Every day | ORAL | Status: DC

## 2017-07-03 MED ORDER — AMOXICILLIN-POT CLAVULANATE 500-125 MG PO TABS
1.0000 | ORAL_TABLET | Freq: Three times a day (TID) | ORAL | Status: DC
  Administered 2017-07-03 – 2017-07-05 (×7): 500 mg via ORAL
  Filled 2017-07-03 (×8): qty 1

## 2017-07-03 MED ORDER — SODIUM CHLORIDE 0.45 % IV SOLN
INTRAVENOUS | Status: DC
  Administered 2017-07-03 (×2): via INTRAVENOUS

## 2017-07-03 MED ORDER — LIDOCAINE 5 % EX PTCH
1.0000 | MEDICATED_PATCH | CUTANEOUS | Status: DC
  Administered 2017-07-04: 1 via TRANSDERMAL
  Filled 2017-07-03 (×2): qty 1

## 2017-07-03 NOTE — Evaluation (Addendum)
Occupational Therapy Evaluation Patient Details Name: GARV KUECHLE MRN: [id number] DOB: 1933-11-20 Today's Date: 07/03/2017    History of Present Illness 84 year old man PMH coronary artery disease, atrial fibrillation, seizure disorder, dementia, presented to the emergency department with confusion. Initial evaluation suggested pneumonia, possibly aspiration the patient was admitted for further evaluation.   Clinical Impression   Pt was admitted for the above. Wife wants to take pt home after hospitalization; she has been helping with most of ADLs prior to admission. Will concentrate on toilet transfers and sit to stand for adls to decrease burden of care.     Follow Up Recommendations  Home health OT;Supervision/Assistance - 24 hour(family declines snf; would benefit)    Equipment Recommendations  None recommended by OT    Recommendations for Other Services       Precautions / Restrictions Precautions Precautions: Fall Restrictions Weight Bearing Restrictions: No      Mobility Bed Mobility               General bed mobility comments: oob  Transfers   Equipment used: Rolling walker (2 wheeled)   Sit to Stand: Min assist;Mod assist Stand pivot transfers: Min assist       General transfer comment: assist to rise and stabilize. Pt steady when standing for hygiene.  Cues for UE placement     Balance                                           ADL either performed or assessed with clinical judgement   ADL Overall ADL's : Needs assistance/impaired Eating/Feeding: Supervision/ safety;Set up;Sitting Eating/Feeding Details (indicate cue type and reason): strategies for aspiration Grooming: Minimal assistance;Sitting   Upper Body Bathing: Moderate assistance;Sitting   Lower Body Bathing: Total assistance;Sit to/from stand   Upper Body Dressing : Moderate assistance;Sitting   Lower Body Dressing: Total assistance;Sit to/from stand    Toilet Transfer: Minimal assistance;Moderate assistance;Stand-pivot;BSC;RW   Toileting- Clothing Manipulation and Hygiene: +2 for physical assistance;Sit to/from stand         General ADL Comments: pt has a tendency to  lean backwards when transitioning from sit to stand. Wife wants to take pt home after this admission.  She has been helping extensively with ADLs     Vision         Perception     Praxis      Pertinent Vitals/Pain Pain Assessment: No/denies pain     Hand Dominance Right   Extremity/Trunk Assessment Upper Extremity Assessment Upper Extremity Assessment: LUE deficits/detail;Generalized weakness LUE Deficits / Details: recently has not been able to lift LUE on his own.  AAROM to 80 tolerated without pain.  Wife reports bil biceps tendons have been detached           Communication Communication Communication: No difficulties   Cognition Arousal/Alertness: Awake/alert Behavior During Therapy: WFL for tasks assessed/performed Overall Cognitive Status: History of cognitive impairments - at baseline                                     General Comments       Exercises     Shoulder Instructions      Home Living Family/patient expects to be discharged to:: Private residence Living Arrangements: Spouse/significant other Available Help at Discharge: Family;Available 24 hours/day  Bathroom Shower/Tub: Occupational psychologist: Handicapped height     Home Equipment: Environmental consultant - 4 wheels;Cane - single point;Bedside commode;Shower seat;Grab bars - tub/shower;Wheelchair - manual;Transport chair          Prior Functioning/Environment Level of Independence: Needs assistance        Comments: pt has had several admissions recently. Wife wants to take him home. She has been doing a lot of the ADL tasks for him        OT Problem List: Decreased strength;Decreased activity tolerance;Impaired balance (sitting  and/or standing);Decreased cognition;Decreased knowledge of use of DME or AE      OT Treatment/Interventions: Self-care/ADL training;DME and/or AE instruction;Patient/family education;Balance training    OT Goals(Current goals can be found in the care plan section) Acute Rehab OT Goals Patient Stated Goal: pt to be stronger, go home OT Goal Formulation: With family Time For Goal Achievement: 07/17/17 Potential to Achieve Goals: Fair ADL Goals Pt Will Transfer to Toilet: with min guard assist;ambulating;bedside commode Additional ADL Goal #1: pt will go from sit to stand with min guard for adls  OT Frequency: Min 2X/week   Barriers to D/C:            Co-evaluation              AM-PAC PT "6 Clicks" Daily Activity     Outcome Measure Help from another person eating meals?: A Little Help from another person taking care of personal grooming?: A Little Help from another person toileting, which includes using toliet, bedpan, or urinal?: A Lot Help from another person bathing (including washing, rinsing, drying)?: A Lot Help from another person to put on and taking off regular upper body clothing?: A Lot Help from another person to put on and taking off regular lower body clothing?: Total 6 Click Score: 13   End of Session    Activity Tolerance: Patient tolerated treatment well Patient left: in chair;with call bell/phone within reach;with chair alarm set;with family/visitor present  OT Visit Diagnosis: Muscle weakness (generalized) (M62.81)                Time: 3267-1245 OT Time Calculation (min): 27 min Charges:  OT General Charges $OT Visit: 1 Visit OT Evaluation $OT Eval Low Complexity: 1 Low OT Treatments $Self Care/Home Management : 8-22 mins G-Codes:     Lesle Chris, OTR/L [phone] 07/03/2017  Rowen Wilmer 07/03/2017, 3:35 PM

## 2017-07-03 NOTE — Consult Note (Signed)
Consultation Note Date: 07/03/2017   Patient Name: [name]  DOB: [birth year]  MRN: [id number]  Age / Sex: 84 y.o., male  PCP: [name], MD Referring Physician: Lavina Hamman, MD  Reason for Consultation: Establishing goals of care  HPI/Patient Profile: 84 y.o. male  with past medical history of recurrent aspiration pneumonia, dementia, CAD, atrial fibrillation, seizure disorder, osteoarthritis admitted on 07/01/2017 with confusion with likely recurrent aspiration pneumonia and acute kidney injury (both improving).   Clinical Assessment and Goals of Care: I met today with [name] and wife, [name]. Conversation halted often to give medications and get him back into bed. He is alert and mostly oriented today (although unsure he followed all the conversation). Covered in depth DNR forms, MOST form and options, and HCPOA/Living Will. Discussed all options with each form. [name] is very motivated to complete these forms and have decisions made. [name] does decide that he desires DNR. [name] seems firm in decisions that she would like for herself (encouraged her to take forms to her provider to sign for her).   [name] was reportedly doing well at home with ambulation and able to ambulate even outside (with spot assist) prior to admission. Still requires assistance with ADLs such as bathing and dressing (takes ~2.5 hours including dressing wounds per wife). No falls. He is feeding himself breakfast when I come in. 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   I also gave them Hard Choices booklet. They are very appreciative of the information. Recommend follow up to consider notarizing Living Will/HCPOA if they are prepared prior to discharge. I also recommended and they very much agreed to having palliative follow up at home to assist with continued conversation  with [name] and possibly his sons for completion of MOST form. All questions/concerns addressed. Emotional support provided.   Primary Decision Maker NEXT OF KIN    SUMMARY OF RECOMMENDATIONS   - DNR decided - Plan to return home - Recommend outpatient palliative follow up from [facility] (recommend in d/c summary and will need order from [name])  Code Status/Advance Care Planning:  DNR   Symptom Management:   Chronic low back pain: Continue home meds. No changes.   Palliative Prophylaxis:   Aspiration, Bowel Regimen, Delirium Protocol, Frequent Pain Assessment and Turn Reposition  Additional Recommendations (Limitations, Scope, Preferences):  Full Scope Treatment outside DNR   Psycho-social/Spiritual:   Desire for further Chaplaincy support:yes  Additional Recommendations: Caregiving  Support/Resources  Prognosis:   Unable to determine - recurrent aspiration pneumonia but appears well. Likely eligible for hospice but not yet prepared for comfort care.   Discharge Planning: Home with Palliative Services      Primary Diagnoses: Present on Admission: . (Resolved) Pneumonia . Memory difficulty   I have reviewed the medical record, interviewed the patient and family, and examined the patient. The following aspects are pertinent.  Past Medical History:  Diagnosis Date  . Abnormality of gait 09/22/2015  . Arthritis   .  Atrial fibrillation ([place])   . CAD (coronary artery disease)    Stent to RCA, Penta stent, 99% reduced to 0% 2002.  . Cancer ([place])    skin CA removed from back  . Chronic insomnia 03/30/2015  . Complication of anesthesia    trouble waking up  . Constipation    from medications taken  . GERD (gastroesophageal reflux disease)   . Hypercholesteremia   . Hypertension   . Hypothyroidism   . Memory difficulty 09/22/2015  . Osteoarthritis   . Seizures ([place])   . Sepsis ([place]) 05/2017  . Transient alteration of awareness 03/30/2015  . Vertigo     hx of   Social History   Socioeconomic History  . Marital status: Married    Spouse name: [name]  . Number of children: 3  . Years of education: 61  . Highest education level: Not on file  Occupational History  . Occupation: retired Software engineer  Social Needs  . Financial resource strain: Not on file  . Food insecurity:    Worry: Not on file    Inability: Not on file  . Transportation needs:    Medical: Not on file    Non-medical: Not on file  Tobacco Use  . Smoking status: Former Research scientist (life sciences)  . Smokeless tobacco: Never Used  Substance and Sexual Activity  . Alcohol use: Yes    Comment: rare  . Drug use: No  . Sexual activity: Not on file  Lifestyle  . Physical activity:    Days per week: Not on file    Minutes per session: Not on file  . Stress: Not on file  Relationships  . Social connections:    Talks on phone: Not on file    Gets together: Not on file    Attends religious service: Not on file    Active member of club or organization: Not on file    Attends meetings of clubs or organizations: Not on file    Relationship status: Not on file  Other Topics Concern  . Not on file  Social History Narrative   Lives at home w/ his wife [name]   Patient drinks 4-5 cups of coffee daily.   Patient is right handed.    Family History  Problem Relation Age of Onset  . Hypertension Mother   . Cancer Mother   . Kidney failure Father   . Heart disease Father    Scheduled Meds: . amoxicillin-clavulanate  1 tablet Oral TID  . apixaban  5 mg Oral BID  . collagenase   Topical Daily  . diltiazem  300 mg Oral Daily  . divalproex  250 mg Oral Daily   And  . divalproex  750 mg Oral QHS  . doxazosin  4 mg Oral Daily  . feeding supplement (PRO-STAT SUGAR FREE 64)  30 mL Oral BID  . gabapentin  300 mg Oral BID  . levothyroxine  150 mcg Oral QAC breakfast  . mouth rinse  15 mL Mouth Rinse BID  . metoprolol tartrate  25 mg Oral BID  . nutrition supplement (JUVEN)  1 packet  Oral BID BM  . pantoprazole  40 mg Oral Daily  . predniSONE  10 mg Oral Q breakfast  . senna  1 tablet Oral BID  . sodium chloride flush  3 mL Intravenous Q12H   Continuous Infusions: . sodium chloride 100 mL/hr at 07/03/17 0835  . sodium chloride     PRN Meds:.sodium chloride, acetaminophen **OR** acetaminophen, HYDROcodone-acetaminophen, lidocaine, lip  balm, sodium chloride flush Allergies  Allergen Reactions  . Demerol [Meperidine] Nausea And Vomiting  . Keppra [Levetiracetam] Other (See Comments)    Causes sleepiness   Review of Systems  Constitutional: Positive for activity change, appetite change and fatigue.  Respiratory: Positive for wheezing.   Neurological: Positive for weakness.    Physical Exam  Constitutional: He appears well-developed.  HENT:  Head: Normocephalic and atraumatic.  Cardiovascular: Normal rate. An irregularly irregular rhythm present.  Pulmonary/Chest: No accessory muscle usage. No tachypnea. He is in respiratory distress.  Mild distress/SOB at rest  Abdominal: Soft. Normal appearance.  Neurological: He is alert.  Mostly oriented but unclear how much of conversation he understands  Nursing note and vitals reviewed.   Vital Signs: BP (!) 142/72 (BP Location: Right Arm)   Pulse 82   Temp 98 F (36.7 C)   Resp 18   Ht '5\' 10"'$  (1.778 m)   Wt 82.1 kg (181 lb)   SpO2 94%   BMI 25.97 kg/m  Pain Scale: 0-10   Pain Score: 0-No pain   SpO2: SpO2: 94 % O2 Device:SpO2: 94 % O2 Flow Rate: .   IO: Intake/output summary:   Intake/Output Summary (Last 24 hours) at 07/03/2017 1232 Last data filed at 07/03/2017 0600 Gross per 24 hour  Intake 2008.75 ml  Output 600 ml  Net 1408.75 ml    LBM: Last BM Date: 07/02/17 Baseline Weight: Weight: 82.1 kg (181 lb) Most recent weight: Weight: 82.1 kg (181 lb)     Palliative Assessment/Data:     Time In: 1030 Time Out: 1230 Time Total: 120 min Greater than 50%  of this time was spent counseling  and coordinating care related to the above assessment and plan.  Signed by: Vinie Sill, NP Palliative Medicine Team Pager # [phone] (M-F 8a-5p) Team Phone # [phone] (Nights/Weekends)

## 2017-07-03 NOTE — Progress Notes (Signed)
PROGRESS NOTE  [name] FTD:[id number] DOB: 1934/02/09 DOA: 07/01/2017 PCP: [name], MD  Brief Narrative: 84 year old man PMH coronary artery disease, atrial fibrillation, seizure disorder, dementia, presented to the emergency department with confusion.  Initial evaluation suggested pneumonia, possibly aspiration the patient was admitted for further evaluation.  Assessment/Plan Suspected pneumonia, history suggest aspiration, with associated acute metabolic encephalopathy.. --Remains afebrile, without hypoxia but still confused.   --was on empiric Unasyn.  Change ot oral Follow-up speech therapy consultation.  Continue aspiration Precautions.   -- encephalopathy is better.   Acute kidney injury versus dehydration Hypernatremia --Renal function modestly improved.   Changed to 1/2 NS --Follow clinically.  Repeat BMP in a.m.  Still has peripheral edema we will continue Lasix starting tomorrow.  Thrombocytopenia, acute --Secondary to acute infection.  Platelet count stable.  Chronic diastolic heart failure --Peripheral edema without significant change.  Continue Lasix, beta-blocker, calcium channel blocker.  Anemia of chronic disease.  Remained stable.  Chronic troponin elevation --Asymptomatic.  EKG nonacute.  No further evaluation suggested  Paroxysmal atrial fibrillation --Stable.   Continue apixaban, diltiazem, metoprolol.  Seizure disorder --Stable.    Continue Depakote.  Chronic right lower extremity wounds present on admission. --Appear stable.  No evidence of secondary infection.  Wound care.  Dementia --Per wife this is minor.  DVT prophylaxis: Apixaban Code Status: Full Family Communication: Wife at bedside Disposition Plan: Home, wife does not want him to go back to a skilled facility  Consultants:    Procedures:    Antimicrobials:  Unasyn 4/22 >>  Interval history/Subjective: Feeling better, no acute complains. Still has  swelling in legs.   Objective: Vitals:  Vitals:   07/03/17 0458 07/03/17 1435  BP: (!) 142/72 109/72  Pulse: 82 68  Resp: 18 16  Temp: 98 F (36.7 C) (!) 97.5 F (36.4 C)  SpO2: 94% 94%    Exam:  Constitutional:  . Appears calm and comfortable Eyes:  . pupils and irises appear normal . Normal lids  ENMT:  . grossly normal hearing  . Lips appear normal Respiratory:  . CTA bilaterally, no w/r/r.  . Respiratory effort normal.  Cardiovascular:  . RRR, no m/r/g . 2+ bilateral LE extremity edema   Abdomen:  . Soft, nontender Skin:  . Wounds right lower extremity noted.  2 ulcers, relatively shallow, no exudate or surrounding erythema. Psychiatric:  . Mental status o Confused.  Difficult to assess mood and affect. . Judgment and insight appear impaired.   I have personally reviewed the following:   Labs:  Creatinine stable, but now has hypernatremia   Hemoglobin and platelet count stable,   Scheduled Meds: . amoxicillin-clavulanate  1 tablet Oral TID  . apixaban  5 mg Oral BID  . collagenase   Topical Daily  . diltiazem  300 mg Oral Daily  . divalproex  250 mg Oral Daily   And  . divalproex  750 mg Oral QHS  . doxazosin  4 mg Oral Daily  . feeding supplement (PRO-STAT SUGAR FREE 64)  30 mL Oral BID  . [START ON 07/04/2017] furosemide  20 mg Oral Daily  . gabapentin  300 mg Oral BID  . levothyroxine  150 mcg Oral QAC breakfast  . [START ON 07/04/2017] lidocaine  1 patch Transdermal Q24H  . mouth rinse  15 mL Mouth Rinse BID  . metoprolol tartrate  25 mg Oral BID  . nutrition supplement (JUVEN)  1 packet Oral BID BM  . pantoprazole  40 mg Oral  Daily  . predniSONE  10 mg Oral Q breakfast  . senna  1 tablet Oral BID  . sodium chloride flush  3 mL Intravenous Q12H   Continuous Infusions: . sodium chloride 100 mL/hr at 07/03/17 0835  . sodium chloride      Active Problems:   Seizure disorder (HCC)   Memory difficulty   Aspiration pneumonia (H[facility])    Acute encephalopathy   Thrombocytopenia (H[facility])   Chronic diastolic CHF (congestive heart failure) (HCC)   Anemia of chronic disease   Troponin level elevated   PAF (paroxysmal atrial fibrillation) ([place])   LOS: 1 day    Author:  Berle Mull, MD [facility] Pager: [phone] 07/03/2017 5:54 PM

## 2017-07-04 DIAGNOSIS — J189 Pneumonia, unspecified organism: Secondary | ICD-10-CM

## 2017-07-04 LAB — BASIC METABOLIC PANEL
Anion gap: 12 (ref 5–15)
BUN: 36 mg/dL — ABNORMAL HIGH (ref 6–20)
CO2: 21 mmol/L — ABNORMAL LOW (ref 22–32)
Calcium: 8.2 mg/dL — ABNORMAL LOW (ref 8.9–10.3)
Chloride: 106 mmol/L (ref 101–111)
Creatinine, Ser: 1.32 mg/dL — ABNORMAL HIGH (ref 0.61–1.24)
GFR calc Af Amer: 57 mL/min — ABNORMAL LOW (ref >60)
GFR calc non Af Amer: 49 mL/min — ABNORMAL LOW (ref >60)
Glucose, Bld: 89 mg/dL (ref 65–99)
Potassium: 4 mmol/L (ref 3.5–5.1)
Sodium: 139 mmol/L (ref 135–145)

## 2017-07-04 LAB — CBC
HCT: 29.9 % — ABNORMAL LOW (ref 39.0–52.0)
Hemoglobin: 9.4 g/dL — ABNORMAL LOW (ref 13.0–17.0)
MCH: 30.7 pg (ref 26.0–34.0)
MCHC: 31.4 g/dL (ref 30.0–36.0)
MCV: 97.7 fL (ref 78.0–100.0)
Platelets: 121 10*3/uL — ABNORMAL LOW (ref 150–400)
RBC: 3.06 MIL/uL — ABNORMAL LOW (ref 4.22–5.81)
RDW: 15.5 % (ref 11.5–15.5)
WBC: 4.3 10*3/uL (ref 4.0–10.5)

## 2017-07-04 LAB — MAGNESIUM: Magnesium: 1.8 mg/dL (ref 1.7–2.4)

## 2017-07-04 MED ORDER — SALINE SPRAY 0.65 % NA SOLN
1.0000 | NASAL | Status: DC | PRN
  Filled 2017-07-04: qty 44

## 2017-07-04 MED ORDER — FUROSEMIDE 10 MG/ML IJ SOLN
20.0000 mg | Freq: Once | INTRAMUSCULAR | Status: AC
  Administered 2017-07-04: 20 mg via INTRAVENOUS
  Filled 2017-07-04: qty 2

## 2017-07-04 MED ORDER — FLUTICASONE PROPIONATE 50 MCG/ACT NA SUSP
2.0000 | Freq: Every day | NASAL | Status: DC
  Administered 2017-07-04 – 2017-07-05 (×2): 2 via NASAL
  Filled 2017-07-04: qty 16

## 2017-07-04 NOTE — Progress Notes (Signed)
Occupational Therapy Treatment Patient Details Name: [name] MRN: [id number] DOB: 1933/06/12 Today's Date: 07/04/2017    History of present illness 84 year old man PMH coronary artery disease, atrial fibrillation, seizure disorder, dementia, presented to the emergency department with confusion. Initial evaluation suggested pneumonia, possibly aspiration the patient was admitted for further evaluation.   OT comments  Worked on sit to stand. Pt did better with each trial.  Has difficulty with directionality with multimodal cues   Follow Up Recommendations  Home health OT;Supervision/Assistance - 24 hour    Equipment Recommendations  None recommended by OT    Recommendations for Other Services      Precautions / Restrictions Precautions Precautions: Fall Restrictions Weight Bearing Restrictions: No       Mobility Bed Mobility         Supine to sit: Min assist;Mod assist   Sit to sidelying: Min assist;Mod assist General bed mobility comments: assist for legs and mostly trunk getting OOB.  back to bed on side as pt could not follow multimodal cues to angle hips x 3 trials  Transfers   Equipment used: Rolling walker (2 wheeled)   Sit to Stand: Min assist;Mod assist         General transfer comment: stood once with min/mod A and 2 additional times with min A.  Cues for UE placement.  Assist to rise and stabilize    Balance                                           ADL either performed or assessed with clinical judgement   ADL                                         General ADL Comments: worked on sit to stand x 3 and stood up to 45 seconds for adls.       Vision       Perception     Praxis      Cognition Arousal/Alertness: Awake/alert Behavior During Therapy: WFL for tasks assessed/performed Overall Cognitive Status: History of cognitive impairments - at baseline                                           Exercises     Shoulder Instructions       General Comments      Pertinent Vitals/ Pain       Pain Assessment: No/denies pain  Home Living                                          Prior Functioning/Environment              Frequency  Min 2X/week        Progress Toward Goals  OT Goals(current goals can now be found in the care plan section)        Plan      Co-evaluation                 AM-PAC PT "6 Clicks" Daily Activity     Outcome Measure  Help from another person eating meals?: A Little Help from another person taking care of personal grooming?: A Little Help from another person toileting, which includes using toliet, bedpan, or urinal?: A Lot Help from another person bathing (including washing, rinsing, drying)?: A Lot Help from another person to put on and taking off regular upper body clothing?: A Lot Help from another person to put on and taking off regular lower body clothing?: Total 6 Click Score: 13    End of Session    OT Visit Diagnosis: Muscle weakness (generalized) (M62.81)   Activity Tolerance Patient tolerated treatment well   Patient Left in bed;with call bell/phone within reach;with bed alarm set;with family/visitor present   Nurse Communication          Time: 3491-7915 OT Time Calculation (min): 25 min  Charges: OT General Charges $OT Visit: 1 Visit OT Treatments $Therapeutic Activity: 23-37 mins  [name], OTR/L [phone] 07/04/2017   [name] 07/04/2017, 12:35 PM

## 2017-07-04 NOTE — Progress Notes (Signed)
  Speech Language Pathology Treatment: Dysphagia  Patient Details Name: [name] MRN: [id number] DOB: 01-Sep-1933 Today's Date: 07/04/2017 Time: 5329-9242 SLP Time Calculation (min) (ACUTE ONLY): 28 min  Assessment / Plan / Recommendation Clinical Impression  Pt had just finished meal when SLP arrived to room - Wife expressed significant concerns re: pt's dysphagia - reviewed in detail aspiration precautions to mitigate risk.  Wife today informs this SLP that pt probably aspirated during Easter meal prior to admit = prompting admit next date.  Pt declined to consume po intake today with this SlP stating he "felt full".   Wife offering concerns re: possible readmissions with pna- and thus provided research study reviewing multiple risk factors for aspiration pneumonia. Advised to mitigate risk factors as much as able.  Reviewed recommendation for pt to drink liquids during meal but assure oral cavity clear prior to drinking liquid - also consider drinking thicker liquid during meal if decreases cough and save thin between meals.  Further reviewed pH neutral status of water making it least caustic to aspirate in small amounts and importance of oral care reviewed.    Advised that modifying behavior (especially with eating) may be challenging and thus modifying diet/intake/environment may be best plan to diminish risk. All education completed- no SLP follow up needed.   HPI HPI: [name] is a 84 y.o. male with medical history significant of HTN, HLD, diastolic CHF, atrial fibrillation on chronic anticoagulation, seizures, hypothyroidism, dementia, chronic lower extremity wound, and GERD; who presents from rehab after being found to have fever with altered mental status.  Pt found to be septic secondary to pna vs wound infection. CXR shows Left lung base atelectasis versus infiltrate increased.  Pt also with h/o multiple spine surgeries- posterior C5-T1 2013 and C2-C5 01/2013.  Swallow eval reordered.   Advised to proceed with MBS since prior clinical evals negative and pt continues with admits for pna.  Pt has not had an MBS in the past and his dementia, and spinal surgeries may impact sensorimotor abilities.        SLP Plan  All goals met       Recommendations  Diet recommendations: Dysphagia 3 (mechanical soft);Thin liquid Liquids provided via: Cup Medication Administration: Whole meds with puree(start and follow with liquids) Supervision: Patient able to self feed;Full supervision/cueing for compensatory strategies Compensations: Slow rate;Small sips/bites;Other (Comment)(start meals with liquids, drink liquids during meal, use puree to help clear oral residuals, use thicker liquids during meal if needed to prevent coughing) Postural Changes and/or Swallow Maneuvers: Seated upright 90 degrees;Upright 30-60 min after meal                Oral Care Recommendations: Oral care BID Follow up Recommendations: None Plan: All goals met       GO                Macario Golds 07/04/2017, 2:19 PM  Luanna Salk, [facility] SLP [phone]

## 2017-07-04 NOTE — Progress Notes (Signed)
Physical Therapy Treatment Patient Details Name: [name] MRN: [id number] DOB: [birth year] Today's Date: 07/04/2017    History of Present Illness 84 year old man PMH coronary artery disease, atrial fibrillation, seizure disorder, dementia, presented to the emergency department with confusion. Initial evaluation suggested pneumonia, possibly aspiration the patient was admitted for further evaluation.    PT Comments     Pt progressing, incr gait distance this pm; no dyspnea noted and pt reports breathing feels much better this afternoon; wife continues to request pt be seen by PT as much as possible   Follow Up Recommendations  Home health PT;Supervision/Assistance - 24 hour     Equipment Recommendations  None recommended by PT    Recommendations for Other Services       Precautions / Restrictions Precautions Precautions: Fall Restrictions Weight Bearing Restrictions: No    Mobility  Bed Mobility Overal bed mobility: Needs Assistance Bed Mobility: Sit to Supine     Supine to sit: Min assist;Mod assist Sit to supine: Min guard   General bed mobility comments: incr time, min/guard for LE   Transfers Overall transfer level: Needs assistance Equipment used: Rolling walker (2 wheeled) Transfers: Sit to/from Stand Sit to Stand: Min assist;Min guard         General transfer comment: min-min/gaurd assist to rise and stabilize, pt uses momentum to rise from recliner  Ambulation/Gait Ambulation/Gait assistance: Min guard Ambulation Distance (Feet): 120 Feet Assistive device: Rolling walker (2 wheeled) Gait Pattern/deviations: Step-through pattern;Decreased stride length;Trunk flexed     General Gait Details: cues for trunk extension and RW distance from self   Stairs             Wheelchair Mobility    Modified Rankin (Stroke Patients Only)       Balance Overall balance assessment: Needs assistance         Standing balance support: Bilateral  upper extremity supported;During functional activity Standing balance-Leahy Scale: Poor Standing balance comment: reliant on at least unilateral UE support maintain static stand                            Cognition Arousal/Alertness: Awake/alert Behavior During Therapy: WFL for tasks assessed/performed Overall Cognitive Status: History of cognitive impairments - at baseline                                 General Comments: improving cognition, alert, responsive      Exercises      General Comments        Pertinent Vitals/Pain Pain Assessment: No/denies pain    Home Living                      Prior Function            PT Goals (current goals can now be found in the care plan section) Acute Rehab PT Goals Patient Stated Goal: pt to be stronger, go home PT Goal Formulation: With patient/family Time For Goal Achievement: 07/02/17 Potential to Achieve Goals: Good Progress towards PT goals: Progressing toward goals    Frequency    Min 3X/week      PT Plan Current plan remains appropriate    Co-evaluation              AM-PAC PT "6 Clicks" Daily Activity  Outcome Measure  Difficulty turning over in bed (including adjusting bedclothes, sheets and  blankets)?: Unable Difficulty moving from lying on back to sitting on the side of the bed? : Unable Difficulty sitting down on and standing up from a chair with arms (e.g., wheelchair, bedside commode, etc,.)?: Unable Help needed moving to and from a bed to chair (including a wheelchair)?: A Little Help needed walking in hospital room?: A Little Help needed climbing 3-5 steps with a railing? : A Lot 6 Click Score: 11    End of Session Equipment Utilized During Treatment: Gait belt Activity Tolerance: Patient tolerated treatment well Patient left: with call bell/phone within reach;with family/visitor present;in chair;with chair alarm set   PT Visit Diagnosis: Unsteadiness on  feet (R26.81);Difficulty in walking, not elsewhere classified (R26.2);Muscle weakness (generalized) (M62.81)     Time: 8182-9937 PT Time Calculation (min) (ACUTE ONLY): 25 min  Charges:  $Gait Training: 23-37 mins $Therapeutic Activity: 23-37 mins                    G CodesKenyon Ana, PT Pager: [phone] 07/04/2017    [facility] 07/04/2017, 4:56 PM

## 2017-07-04 NOTE — Progress Notes (Signed)
Patient active with [facility] 1st program, will need resumptions of services at d/c. Contacted [facility] to let them know of admission. Awaiting resumption orders. [phone]

## 2017-07-04 NOTE — Progress Notes (Signed)
Physical Therapy Treatment Patient Details Name: [name] MRN: [id number] DOB: 12-12-1933 Today's Date: 07/04/2017    History of Present Illness 84 year old man PMH coronary artery disease, atrial fibrillation, seizure disorder, dementia, presented to the emergency department with confusion. Initial evaluation suggested pneumonia, possibly aspiration the patient was admitted for further evaluation.    PT Comments    Pt progressing slowly; wife continues to desire to take pt home; will attempt to see again as schedule permits to improve mobility so pt may return to level that wife can manage him at home;   Follow Up Recommendations  Home health PT;Supervision/Assistance - 24 hour     Equipment Recommendations  None recommended by PT    Recommendations for Other Services       Precautions / Restrictions Precautions Precautions: Fall Restrictions Weight Bearing Restrictions: No    Mobility  Bed Mobility Overal bed mobility: Needs Assistance Bed Mobility: Sit to Supine       Sit to supine: Min guard   General bed mobility comments: incr time, min/guard for LE   Transfers Overall transfer level: Needs assistance Equipment used: Rolling walker (2 wheeled) Transfers: Sit to/from Stand Sit to Stand: Min assist         General transfer comment: min assist to rise and stabilize  Ambulation/Gait Ambulation/Gait assistance: Min assist Ambulation Distance (Feet): 10 Feet Assistive device: Rolling walker (2 wheeled) Gait Pattern/deviations: Step-through pattern;Decreased stride length;Trunk flexed     General Gait Details: cues for RW position, posture, incr BOS(pt incontinent of stool--distance ltd)   Stairs             Wheelchair Mobility    Modified Rankin (Stroke Patients Only)       Balance                                            Cognition Arousal/Alertness: Awake/alert Behavior During Therapy: WFL for tasks  assessed/performed Overall Cognitive Status: History of cognitive impairments - at baseline                                 General Comments: improving cognition, alert, responsive      Exercises      General Comments        Pertinent Vitals/Pain Pain Assessment: No/denies pain    Home Living                      Prior Function            PT Goals (current goals can now be found in the care plan section) Acute Rehab PT Goals Patient Stated Goal: pt to be stronger, go home PT Goal Formulation: With patient/family Time For Goal Achievement: 07/02/17 Potential to Achieve Goals: Good Progress towards PT goals: Progressing toward goals    Frequency    Min 3X/week      PT Plan Current plan remains appropriate    Co-evaluation              AM-PAC PT "6 Clicks" Daily Activity  Outcome Measure  Difficulty turning over in bed (including adjusting bedclothes, sheets and blankets)?: Unable Difficulty moving from lying on back to sitting on the side of the bed? : Unable Difficulty sitting down on and standing up from a chair with arms (e.g.,  wheelchair, bedside commode, etc,.)?: Unable Help needed moving to and from a bed to chair (including a wheelchair)?: A Lot Help needed walking in hospital room?: A Lot Help needed climbing 3-5 steps with a railing? : A Lot 6 Click Score: 9    End of Session Equipment Utilized During Treatment: Gait belt Activity Tolerance: Patient tolerated treatment well Patient left: in bed;with call bell/phone within reach;with bed alarm set;with family/visitor present   PT Visit Diagnosis: Unsteadiness on feet (R26.81);Difficulty in walking, not elsewhere classified (R26.2);Muscle weakness (generalized) (M62.81)     Time: 9741-6384 PT Time Calculation (min) (ACUTE ONLY): 38 min  Charges:  $Gait Training: 8-22 mins $Therapeutic Activity: 23-37 mins                    G CodesKenyon [name],  PT Pager: [phone] 07/04/2017    [name] 07/04/2017, 4:11 PM

## 2017-07-04 NOTE — Progress Notes (Addendum)
PROGRESS NOTE  [name] CWC:[id number] DOB: 04/05/1933 DOA: 07/01/2017 PCP: [name], MD  Brief Narrative: 84 year old man PMH coronary artery disease, atrial fibrillation, seizure disorder, dementia, presented to the emergency department with confusion.  Initial evaluation suggested pneumonia, possibly aspiration the patient was admitted for further evaluation.  Assessment/Plan Suspected pneumonia, history suggest aspiration, with associated acute metabolic encephalopathy.. --Remains afebrile, without hypoxia but still confused.   --was on empiric Unasyn.  Change ot oral Augmentin Modified barium swallow suggest dysphagia 3 diet, tolerating it very well. Continue aspiration Precautions.   -- encephalopathy is better.   Acute kidney injury versus dehydration Hypernatremia Acute on chronic diastolic CHF. Volume status is difficult to judge in this patient. Initially presented with aspiration and evidence of dehydration.  Received IV fluids.  Developed volume overload.  Received oral Lasix.  With poor p.o. intake renal function worsened again on oral Lasix.  And patient was given IV fluids. Due to hypernatremia fluid worse changed to half-normal saline. Now serum creatinine and electrolytes are back to baseline. Also now that the p.o. intake has improved I will give patient IV Lasix x1 given that the patient has gained 9 pounds from his baseline of 177 pounds. Monitor renal function for now.  Thrombocytopenia, acute --Secondary to acute infection.  Platelet count stable.  Anemia of chronic disease.  Remained stable.  Chronic troponin elevation --Asymptomatic.  EKG nonacute.  No further evaluation suggested  Paroxysmal atrial fibrillation --Stable.   Continue apixaban, diltiazem, metoprolol.  Seizure disorder --Stable.    Continue Depakote.  Chronic right lower extremity wounds present on admission. --Appear stable.  No evidence of secondary infection.   Wound care.  Dementia --Per wife this is minor.  Advanced goals of care discussion. Patient had an extensive discussion with palliative care 2 days ago although wife was concerned that the patient was not able to understand what the discussion was all about and also do not understand the meaning of DNR/DNI. Patient's RN was in the room when I had a discussion with the patient's wife as well as patient. Patient was in his own mind alert awake and oriented x3. Discussed with patient meaning of the DNR as well as DNI.  Also mentioned that if he choose to be a DNR/DNI in the event if he has arrest that would be the end of life for him. Patient understood this and wants to remain DNR/DNI. Discussed with wife whether she is understanding patient's decision and is agreeable and at peace with that decision. Wife verbalized that patient is in his right mind to make this decision and she agrees with that decision as well. She was worried about it being not documented anywhere. I informed that while in the hospital this communication serves as documentation and outside of the hospital he will be given a gold form for DNR/DNI as well as pink form for MOST. Additional 35 minutes regarding this discussion, also discussed with palliative care who was involved in patient's care.  DVT prophylaxis: Apixaban Code Status: Full Family Communication: Wife at bedside Disposition Plan: Home, wife does not want him to go back to a skilled facility  Consultants:  Palliative care   Procedures:  MBS  Antimicrobials:  Unasyn 4/22 >>augmentin   Interval history/Subjective: Continues to have shortness of breath as well as smothering feeling.  Feels like his nose is congested.  No chest pain no abdominal pain.  Ambulated with therapy and saturation remained 94% on room air.  Objective: Vitals:  Vitals:  07/04/17 0636 07/04/17 1415  BP: 131/73 115/63  Pulse: 70 (!) 57  Resp: 16 (!) 21  Temp: 98.3 F  (36.8 C) 97.7 F (36.5 C)  SpO2:  96%    Exam:  Constitutional:  . Appears calm and comfortable Eyes:  . pupils and irises appear normal . Normal lids  ENMT:  . grossly normal hearing  . Lips appear normal Respiratory:  . CTA bilaterally, no w/r/r.  . Respiratory effort normal.  Cardiovascular:  . RRR, no m/r/g . 2+ bilateral LE extremity edema   Abdomen:  . Soft, nontender Skin:  . Wounds right lower extremity noted.  2 ulcers, relatively shallow, no exudate or surrounding erythema. Psychiatric:  . Mental status o Confused.  Difficult to assess mood and affect. . Judgment and insight appear impaired.   I have personally reviewed the following:   Labs:  Creatinine stable, but now has hypernatremia   Hemoglobin and platelet count stable,   Scheduled Meds: . amoxicillin-clavulanate  1 tablet Oral TID  . apixaban  5 mg Oral BID  . collagenase   Topical Daily  . diltiazem  300 mg Oral Daily  . divalproex  250 mg Oral Daily   And  . divalproex  750 mg Oral QHS  . doxazosin  4 mg Oral Daily  . feeding supplement (PRO-STAT SUGAR FREE 64)  30 mL Oral BID  . gabapentin  300 mg Oral BID  . levothyroxine  150 mcg Oral QAC breakfast  . lidocaine  1 patch Transdermal Q24H  . mouth rinse  15 mL Mouth Rinse BID  . metoprolol tartrate  25 mg Oral BID  . nutrition supplement (JUVEN)  1 packet Oral BID BM  . pantoprazole  40 mg Oral Daily  . predniSONE  10 mg Oral Q breakfast  . senna  1 tablet Oral BID  . sodium chloride flush  3 mL Intravenous Q12H   Continuous Infusions: . sodium chloride      Active Problems:   Seizure disorder (HCC)   Memory difficulty   Aspiration pneumonia (HCC)   Acute encephalopathy   Thrombocytopenia (HCC)   Chronic diastolic CHF (congestive heart failure) (HCC)   Anemia of chronic disease   Troponin level elevated   PAF (paroxysmal atrial fibrillation) ([place])   LOS: 2 days    Author:  Berle Mull, MD [facility] Pager: [phone] 07/04/2017 4:33 PM

## 2017-07-05 LAB — BASIC METABOLIC PANEL
Anion gap: 12 (ref 5–15)
BUN: 31 mg/dL — ABNORMAL HIGH (ref 6–20)
CO2: 23 mmol/L (ref 22–32)
Calcium: 8.5 mg/dL — ABNORMAL LOW (ref 8.9–10.3)
Chloride: 108 mmol/L (ref 101–111)
Creatinine, Ser: 1.24 mg/dL (ref 0.61–1.24)
GFR calc Af Amer: >60 mL/min (ref >60)
GFR calc non Af Amer: 53 mL/min — ABNORMAL LOW (ref >60)
Glucose, Bld: 83 mg/dL (ref 65–99)
Potassium: 3.8 mmol/L (ref 3.5–5.1)
Sodium: 143 mmol/L (ref 135–145)

## 2017-07-05 LAB — CBC
HCT: 30.1 % — ABNORMAL LOW (ref 39.0–52.0)
Hemoglobin: 9.5 g/dL — ABNORMAL LOW (ref 13.0–17.0)
MCH: 30.8 pg (ref 26.0–34.0)
MCHC: 31.6 g/dL (ref 30.0–36.0)
MCV: 97.7 fL (ref 78.0–100.0)
Platelets: 125 10*3/uL — ABNORMAL LOW (ref 150–400)
RBC: 3.08 MIL/uL — ABNORMAL LOW (ref 4.22–5.81)
RDW: 15.3 % (ref 11.5–15.5)
WBC: 4.4 10*3/uL (ref 4.0–10.5)

## 2017-07-05 MED ORDER — FUROSEMIDE 20 MG PO TABS: 20.0000 mg | ORAL_TABLET | Freq: Every day | ORAL | 0 refills | Status: DC

## 2017-07-05 MED ORDER — FUROSEMIDE 10 MG/ML IJ SOLN
20.0000 mg | Freq: Once | INTRAMUSCULAR | Status: AC
  Administered 2017-07-05: 20 mg via INTRAVENOUS
  Filled 2017-07-05: qty 2

## 2017-07-05 MED ORDER — AMOXICILLIN-POT CLAVULANATE 500-125 MG PO TABS: 1.0000 | ORAL_TABLET | Freq: Three times a day (TID) | ORAL | 0 refills | Status: AC

## 2017-07-05 MED ORDER — PRO-STAT SUGAR FREE PO LIQD: 30.0000 mL | Freq: Two times a day (BID) | ORAL | 0 refills | Status: DC

## 2017-07-05 NOTE — Progress Notes (Signed)
CSW consult-Transportation needs.  Patient D/C Home by PTAR. Case Manager has arranged.  No other CSW identified.   Kathrin Greathouse, Latanya Presser, MSW Clinical Social Worker  [phone] 07/05/2017  11:44 AM

## 2017-07-05 NOTE — Progress Notes (Signed)
Discharge instructions given at length to wife and patient wuestions ask and answered.Bethann Punches RN

## 2017-07-05 NOTE — Progress Notes (Signed)
Physical Therapy Treatment Patient Details Name: [name] MRN: [id number] DOB: 20-Aug-1933 Today's Date: 07/05/2017    History of Present Illness 84 year old man PMH coronary artery disease, atrial fibrillation, seizure disorder, dementia, presented to the emergency department with confusion. Initial evaluation suggested pneumonia, possibly aspiration the patient was admitted for further evaluation.    PT Comments    Pt OOB in recliner.  Assisted with amb in hallway with walker.  Pt plans to D/C back home via PT[facility] with spouse.     Follow Up Recommendations  Home health PT;Supervision/Assistance - 24 hour     Equipment Recommendations  None recommended by PT    Recommendations for Other Services       Precautions / Restrictions Precautions Precautions: Fall Restrictions Weight Bearing Restrictions: No    Mobility  Bed Mobility               General bed mobility comments: OOB in recliner  Transfers Overall transfer level: Needs assistance Equipment used: Rolling walker (2 wheeled) Transfers: Sit to/from Stand Sit to Stand: Min assist;Min guard Stand pivot transfers: Min assist       General transfer comment:  50% VC's on safety with turns as well as hand placement.   Ambulation/Gait Ambulation/Gait assistance: Min assist;Min guard Ambulation Distance (Feet): 85 Feet Assistive device: Rolling walker (2 wheeled) Gait Pattern/deviations: Step-through pattern;Decreased stride length;Trunk flexed Gait velocity: decreased   General Gait Details: poor flex posture B hips/knees with a shuffled gait   Stairs             Wheelchair Mobility    Modified Rankin (Stroke Patients Only)       Balance                                            Cognition Arousal/Alertness: Awake/alert Behavior During Therapy: WFL for tasks assessed/performed Overall Cognitive Status: History of cognitive impairments - at baseline                                 General Comments: short term memory deficit      Exercises      General Comments        Pertinent Vitals/Pain Pain Assessment: Faces Pain Location: "all over"  Pain Descriptors / Indicators: Constant;Grimacing Pain Intervention(s): Monitored during session    Home Living                      Prior Function            PT Goals (current goals can now be found in the care plan section) Progress towards PT goals: Progressing toward goals    Frequency    Min 3X/week      PT Plan Current plan remains appropriate    Co-evaluation              AM-PAC PT "6 Clicks" Daily Activity  Outcome Measure  Difficulty turning over in bed (including adjusting bedclothes, sheets and blankets)?: A Lot Difficulty moving from lying on back to sitting on the side of the bed? : A Lot Difficulty sitting down on and standing up from a chair with arms (e.g., wheelchair, bedside commode, etc,.)?: A Lot Help needed moving to and from a bed to chair (including a wheelchair)?: A Lot Help needed walking  in hospital room?: A Lot Help needed climbing 3-5 steps with a railing? : A Lot 6 Click Score: 12    End of Session Equipment Utilized During Treatment: Gait belt Activity Tolerance: Patient tolerated treatment well Patient left: with call bell/phone within reach;with family/visitor present;in chair;with chair alarm set Nurse Communication: Mobility status PT Visit Diagnosis: Unsteadiness on feet (R26.81);Difficulty in walking, not elsewhere classified (R26.2);Muscle weakness (generalized) (M62.81)     Time: 0177-9390 PT Time Calculation (min) (ACUTE ONLY): 13 min  Charges:  $Gait Training: 8-22 mins                    G Codes:       Rica Koyanagi  PTA [facility]  Acute  Rehab Pager      [phone]

## 2017-07-05 NOTE — Progress Notes (Signed)
Spoke with spouse at bedside. Confirmed resumption of care with [facility]. Spouse requesting transport home by PTAR. No DME needs. Medical necessity form on shadow chart, nurse to call PTAR when ready for d/c. [phone]

## 2017-07-05 NOTE — Progress Notes (Signed)
   07/05/17 1400  Clinical Encounter Type  Visited With Patient  Visit Type Initial  Referral From Nurse  Consult/Referral To Chaplain  Spiritual Encounters  Spiritual Needs Emotional   Responded to a SCC for support and AD.  Patient is being discharged.  We visited for a bit and he shared about some of his health issues and expressed great joy in going home.  He and his wife have a strong faith and are supported by a faith community.  Will follow and support as needed. Chaplain Katherene Ponto

## 2017-07-09 NOTE — Discharge Summary (Signed)
[facility] Discharge Summary   Patient: [name] OEU:[id number]   PCP: [name], MD DOB: [birth year]   Date of admission: 07/01/2017   Date of discharge: 07/05/2017    Discharge Diagnoses:  Active Problems:   Seizure disorder (HCC)   Memory difficulty   Aspiration pneumonia ([place])   Acute encephalopathy   Thrombocytopenia (HCC)   Chronic diastolic CHF (congestive heart failure) (HCC)   Anemia of chronic disease   Troponin level elevated   PAF (paroxysmal atrial fibrillation) ([place])   Admitted From: home Disposition:  home  Recommendations for Outpatient Follow-up:  1. Follow-up with PCP in 1 week  Follow-up Information    [name], MD. Schedule an appointment as soon as possible for a visit in 1 week(s).   Specialty:  Internal Medicine Contact information: [place] [address] [phone]        Skeet Latch, MD .   Specialty:  Cardiology Contact information: [address] [place] [address] [phone]        Care, [facility] Follow up.   Specialty:  Home Health Services Why:  [facility] Contact information: [address] [place] [address] [phone]          Diet recommendation: Cardiac diet  Activity: The patient is advised to gradually reintroduce usual activities.  Discharge Condition: good  Code Status: DNR DNI  History of present illness: As per the H and P dictated on admission, "84 year old man PMH coronary artery disease, atrial fibrillation, seizure disorder, dementia, presented to the emergency department with confusion.  Initial evaluation suggested pneumonia, possibly aspiration the patient was admitted for further evaluation.  Patient mildly confused.  All history obtained from wife at bedside.  Patient has been hospitalized twice in the last 3 months, both times for pneumonia and sepsis.  The first day he went to rehab, but after  the most recent admission he went home with his wife.  He has been home approximately 1 month and has been making gradual improvements in strength and mobility.  He has mild memory loss but in general is quite sharp per wife.  They had dinner last night with friends and he seemed to do well although he was tired.  He did have a choking episode on wine and may have had some food in his mouth at that time.  This morning he did not get up as usual and his wife had to awaken him at 11 AM, had a difficult time doing so and he was quite confused and delirious at the time.  He did not recognize her at that time.  She is not noted any systemic symptoms or focal symptoms other than this.  No specific aggravating or alleviating factors notable or associated symptoms.  However the confusion was a dramatic change from yesterday.  Home health nurse came out to see the patient and agreed and recommended hospital evaluation."  Hospital Course:  Summary of his active problems in the hospital is as following. Suspected pneumonia, history suggest aspiration, with associated acute metabolic encephalopathy.. --Remains afebrile, without hypoxia but still confused.   --was on empiric Unasyn.  Change ot oral Augmentin Modified barium swallow suggest dysphagia 3 diet, tolerating it very well. Continue aspiration Precautions.   -- encephalopathy is better.   Acute kidney injury versus dehydration Hypernatremia Acute on chronic diastolic CHF. Volume status is difficult to judge in this patient. Initially presented with aspiration and evidence of dehydration.  Received IV fluids.  Developed volume overload.  Received oral Lasix.  With poor p.o. intake renal function worsened again on oral Lasix.  And patient was given IV fluids. Due to hypernatremia fluid worse changed to half-normal saline. Now serum creatinine and electrolytes are back to baseline. Responded to IV Lasix, will discharge on oral Lasix daily.  Follow-up with  PCP with a BMP.  Thrombocytopenia, acute --Secondary to acute infection.  Platelet count stable.  Anemia of chronic disease.  Remained stable.  Chronic troponin elevation --Asymptomatic. EKG nonacute. No further evaluation suggested  Paroxysmal atrial fibrillation --Stable.  Continue apixaban, diltiazem, metoprolol.  Seizure disorder --Stable.   Continue Depakote.  Chronic right lower extremity wounds present on admission. --Appear stable.  No evidence of secondary infection.  Wound care.  Dementia --Per wife this is minor.  Advanced goals of care discussion. Patient had an extensive discussion with palliative care 2 days ago although wife was concerned that the patient was not able to understand what the discussion was all about and also do not understand the meaning of DNR/DNI. Patient's RN was in the room when I had a discussion with the patient's wife as well as patient. Patient was in his own mind alert awake and oriented x3. Discussed with patient meaning of the DNR as well as DNI.  Also mentioned that if he choose to be a DNR/DNI in the event if he has arrest that would be the end of life for him. Patient understood this and wants to remain DNR/DNI. Discussed with wife whether she is understanding patient's decision and is agreeable and at peace with that decision. Wife verbalized that patient is in his right mind to make this decision and she agrees with that decision as well. She was worried about it being not documented anywhere. I informed that while in the hospital this communication serves as documentation and outside of the hospital he will be given a gold form for DNR/DNI as well as pink form for MOST.  All other chronic medical condition were stable during the hospitalization.  Patient was seen by physical therapy, who recommended SNF, family chose home health, which was arranged by Education officer, museum and case Freight forwarder. On the day of the discharge the  patient's vitals were stable , and no other acute medical condition were reported by patient. the patient was felt safe to be discharge at home with home health.  Consultants: Palliative care  Procedures: none  DISCHARGE MEDICATION: Allergies as of 07/05/2017      Reactions   Demerol [meperidine] Nausea And Vomiting   Keppra [levetiracetam] Other (See Comments)   Causes sleepiness      Medication List    TAKE these medications   acetaminophen 325 MG tablet Commonly known as:  TYLENOL Take 650 mg by mouth every 4 (four) hours as needed for mild pain.   alendronate 70 MG tablet Commonly known as:  FOSAMAX Take 70 mg by mouth once a week.   apixaban 5 MG Tabs tablet Commonly known as:  ELIQUIS Take 1 tablet (5 mg total) by mouth 2 (two) times daily.   calcium carbonate 1250 (500 Ca) MG tablet Commonly known as:  OS-CAL - dosed in mg of elemental calcium Take 1 tablet (1,250 mg total) by mouth 2 (two) times daily with a meal.   collagenase ointment Commonly known as:  SANTYL Apply topically daily.   CoQ10 200 MG Caps Take 200 mg by mouth at bedtime.   diltiazem 300 MG 24 hr capsule  Commonly known as:  CARDIZEM CD Take 1 capsule (300 mg total) by mouth daily.   divalproex 250 MG DR tablet Commonly known as:  DEPAKOTE Take 3 tablets (750 mg total) by mouth every 12 (twelve) hours. What changed:    how much to take  when to take this  additional instructions   docusate sodium 100 MG capsule Commonly known as:  COLACE Take 200 mg by mouth 2 (two) times daily.   doxazosin 4 MG tablet Commonly known as:  CARDURA Take 1 tablet (4 mg total) by mouth daily.   feeding supplement (ENSURE ENLIVE) Liqd Take 237 mLs by mouth 3 (three) times daily between meals.   feeding supplement (PRO-STAT SUGAR FREE 64) Liqd Take 30 mLs by mouth 2 (two) times daily.   ferrous sulfate 325 (65 FE) MG tablet Take 1 tablet (325 mg total) by mouth 2 (two) times daily with a meal.     furosemide 20 MG tablet Commonly known as:  LASIX Take 1 tablet (20 mg total) by mouth daily. What changed:  Another medication with the same name was removed. Continue taking this medication, and follow the directions you see here.   gabapentin 300 MG capsule Commonly known as:  NEURONTIN Take 300 mg by mouth 2 (two) times daily.   HYDROcodone-acetaminophen 10-325 MG tablet Commonly known as:  NORCO Take 1 tablet by mouth every 6 (six) hours as needed for moderate pain.   levothyroxine 150 MCG tablet Commonly known as:  SYNTHROID, LEVOTHROID Take 150 mcg by mouth daily before breakfast.   lidocaine 5 % Commonly known as:  LIDODERM Place 1 patch onto the skin daily as needed. Remove & Discard patch within 12 hours or as directed by MD   metoprolol tartrate 25 MG tablet Commonly known as:  LOPRESSOR Take 1 tablet (25 mg total) by mouth 2 (two) times daily.   multivitamin with minerals Tabs tablet Take 1 tablet by mouth at bedtime.   nitroGLYCERIN 0.4 MG SL tablet Commonly known as:  NITROSTAT Place 0.4 mg under the tongue every 5 (five) minutes as needed for chest pain. Reported on 07/20/2015   omeprazole 20 MG capsule Commonly known as:  PRILOSEC Take 20 mg by mouth 2 (two) times daily before a meal.   pravastatin 40 MG tablet Commonly known as:  PRAVACHOL Take 40 mg by mouth at bedtime.   predniSONE 10 MG tablet Commonly known as:  DELTASONE Take 1 tablet (10 mg total) by mouth daily with breakfast.   senna 8.6 MG tablet Commonly known as:  SENOKOT Take 1 tablet by mouth 2 (two) times daily.   vitamin A 8000 UNIT capsule Take 8,000 Units by mouth 2 (two) times daily.   vitamin B-12 500 MCG tablet Commonly known as:  CYANOCOBALAMIN Take 500 mcg by mouth at bedtime.   vitamin C 500 MG tablet Commonly known as:  ASCORBIC ACID Take 500 mg by mouth every morning.   Vitamin D-3 1000 units Caps Take 1,000 Units by mouth 2 (two) times daily.   ZETIA 10 MG  tablet Generic drug:  ezetimibe Take 5 mg by mouth at bedtime.   zinc sulfate 220 (50 Zn) MG capsule Take 1 capsule (220 mg total) by mouth every morning.     ASK your doctor about these medications   amoxicillin-clavulanate 500-125 MG tablet Commonly known as:  AUGMENTIN Take 1 tablet (500 mg total) by mouth 3 (three) times daily for 2 days. Ask about: Should I take this medication?  Allergies  Allergen Reactions  . Demerol [Meperidine] Nausea And Vomiting  . Keppra [Levetiracetam] Other (See Comments)    Causes sleepiness   Discharge Instructions    Diet - low sodium heart healthy   Complete by:  As directed    Discharge instructions   Complete by:  As directed    It is important that you read following instructions as well as go over your medication list with RN to help you understand your care after this hospitalization.  Discharge Instructions: Please follow-up with PCP in one week  Please request your primary care physician to go over all Hospital Tests and Procedure/Radiological results at the follow up,  Please get all Hospital records sent to your PCP by signing hospital release before you go home.   Do not take more than prescribed Pain, Sleep and Anxiety Medications. You were cared for by a hospitalist during your hospital stay. If you have any questions about your discharge medications or the care you received while you were in the hospital after you are discharged, you can call the unit and ask to speak with the hospitalist on call if the hospitalist that took care of you is not available.  Once you are discharged, your primary care physician will handle any further medical issues. Please note that NO REFILLS for any discharge medications will be authorized once you are discharged, as it is imperative that you return to your primary care physician (or establish a relationship with a primary care physician if you do not have one) for your aftercare needs so that  they can reassess your need for medications and monitor your lab values. You Must read complete instructions/literature along with all the possible adverse reactions/side effects for all the Medicines you take and that have been prescribed to you. Take any new Medicines after you have completely understood and accept all the possible adverse reactions/side effects. Wear Seat belts while driving. If you have smoked or chewed Tobacco in the last 2 yrs please stop smoking and/or stop any Recreational drug use.   Increase activity slowly   Complete by:  As directed      Discharge Exam: Filed Weights   07/01/17 2200 07/04/17 0831 07/05/17 0500  Weight: 82.1 kg (181 lb) 84.5 kg (186 lb 4.6 oz) 83.4 kg (183 lb 13.8 oz)   Vitals:   07/04/17 2128 07/05/17 0547  BP: 133/84 (!) 142/94  Pulse: 80 89  Resp: 15 11  Temp: 98 F (36.7 C) 98.6 F (37 C)  SpO2: 97% 93%   General: Appear in no distress, no Rash; Oral Mucosa moist. Cardiovascular: S1 and S2 Present, no Murmur, no JVD Respiratory: Bilateral Air entry present and Clear to Auscultation, no Crackles, no wheezes Abdomen: Bowel Sound present, Soft and no tenderness Extremities: no Pedal edema, no calf tenderness Neurology: Grossly no focal neuro deficit.  The results of significant diagnostics from this hospitalization (including imaging, microbiology, ancillary and laboratory) are listed below for reference.    Significant Diagnostic Studies: Dg Chest 2 View  Result Date: 07/01/2017 CLINICAL DATA:  Altered mental status, and confusion. EXAM: CHEST - 2 VIEW COMPARISON:  05/27/2017. FINDINGS: The heart is enlarged. BILATERAL lower lobe pulmonary opacities have progressed from prior radiograph, likely represent combination of atelectasis, effusion, and infiltrate. Old RIGHT-sided rib fractures are stable. Worsening aeration from priors. IMPRESSION: Worsening aeration. Cardiomegaly. Lower lobe opacities are increased. Electronically Signed    By: Staci Righter M.D.   On: 07/01/2017 15:52   Ct  Head Wo Contrast  Result Date: 07/01/2017 CLINICAL DATA:  Patient coming from home with c/o altered mental status. Patient wife call EMS stated patient been very hard to arouse. Pt did wake up little but was confused. Per ems pt been alert and oriented x 4. EXAM: CT HEAD WITHOUT CONTRAST TECHNIQUE: Contiguous axial images were obtained from the base of the skull through the vertex without intravenous contrast. COMPARISON:  04/17/2017 FINDINGS: Brain: No evidence of acute infarction, hemorrhage, extra-axial collection, ventriculomegaly, or mass effect. Generalized cerebral atrophy. Periventricular white matter low attenuation likely secondary to microangiopathy. Vascular: Cerebrovascular atherosclerotic calcifications are noted. Skull: Negative for fracture or focal lesion. Sinuses/Orbits: Visualized portions of the orbits are unremarkable. Visualized portions of the paranasal sinuses and mastoid air cells are unremarkable. Other: None. IMPRESSION: 1. No acute intracranial pathology. 2. Chronic microvascular disease and cerebral atrophy. Electronically Signed   By: Kathreen Devoid   On: 07/01/2017 15:51   Dg Swallowing Func-speech Pathology  Result Date: 07/02/2017 Objective Swallowing Evaluation: Type of Study: MBS-Modified Barium Swallow Study  Patient Details Name: [name] MRN: [id number] Date of Birth: 1933-10-11 Today's Date: 07/02/2017 Time: SLP Start Time (ACUTE ONLY): 1127 -SLP Stop Time (ACUTE ONLY): 1210 SLP Time Calculation (min) (ACUTE ONLY): 43 min Past Medical History: Past Medical History: Diagnosis Date . Abnormality of gait 09/22/2015 . Arthritis  . Atrial fibrillation ([place])  . CAD (coronary artery disease)   Stent to RCA, Penta stent, 99% reduced to 0% 2002. . Cancer ([place])   skin CA removed from back . Chronic insomnia 03/30/2015 . Complication of anesthesia   trouble waking up . Constipation   from medications taken . GERD (gastroesophageal  reflux disease)  . Hypercholesteremia  . Hypertension  . Hypothyroidism  . Memory difficulty 09/22/2015 . Osteoarthritis  . Seizures ([place])  . Sepsis ([place]) 05/2017 . Transient alteration of awareness 03/30/2015 . Vertigo   hx of Past Surgical History: Past Surgical History: Procedure Laterality Date . BACK SURGERY   . EYE SURGERY    Bilateral Cataract surgery  . HERNIA REPAIR   . I&D EXTREMITY Right 05/10/2015  Procedure: IRRIGATION AND DEBRIDEMENT EXTREMITY;  Surgeon: Leanora Cover, MD;  Location: [facility];  Service: Orthopedics;  Laterality: Right; . KNEE ARTHROPLASTY    right knee X 2; left knee once . LAMINECTOMY    X 6 . POSTERIOR CERVICAL FUSION/FORAMINOTOMY  01/28/2012  Procedure: POSTERIOR CERVICAL FUSION/FORAMINOTOMY LEVEL 3;  Surgeon: [name], MD;  Location: [facility];  Service: Neurosurgery;  Laterality: Left;  Posterior Cervical Five-Thoracic One Fusion, Arthrodesis with LEFT Cervical Seven-thoracic One Laminectomy, Foraminotomy and Resection of Synovial Cyst . POSTERIOR CERVICAL FUSION/FORAMINOTOMY N/A 01/29/2013  Procedure: POSTERIOR CERVICAL FUSION/FORAMINOTOMY LEVEL 1 and C2-5 Posteriolateral Arthrodesis;  Surgeon: [name], MD;  Location: [employer] [facility] [facility];  Service: Neurosurgery;  Laterality: N/A;  C2-C3 Laminectomy C2-C3 posterior cervical arthrodesis . TONSILLECTOMY   HPI: ELFEGO GIAMMARINO is a 84 y.o. male with medical history significant of HTN, HLD, diastolic CHF, atrial fibrillation on chronic anticoagulation, seizures, hypothyroidism, dementia, chronic lower extremity wound, and GERD; who presents from rehab after being found to have fever with altered mental status.  Pt found to be septic secondary to pna vs wound infection. CXR shows Left lung base atelectasis versus infiltrate increased.  Pt also with h/o multiple spine surgeries- posterior C5-T1 2013 and C2-C5 01/2013.  Swallow eval reordered.  Advised to proceed with MBS since prior clinical evals negative and  pt continues with  admits for pna.  Pt has not had an MBS in the past and his dementia, and spinal surgeries may impact sensorimotor abilities.   Subjective: pt awake in chair Assessment / Plan / Recommendation CHL IP CLINICAL IMPRESSIONS 07/02/2017 Clinical Impression Suboptimal view of MBS due to pt's shoulders blocking some portion of pharynx/larynx and trachea.  Mild oropharyngeal dysphagia without aspiration or penetration of any consistency tested.   Pt did have decreased oral coordination/propulsion with barium tablet taken with thin resulting in barium tablet precariously lodging at vallecular space.  Pt was cued NOT to talk and provided with more thin to aid transit.  Tablet then lodged at proximal esophagus (near area of hardware) with minimal backflow of tablet, however pt able to clear with reflexive further swallow.  Suspect primary aspiration risk is due to pharyngo-cervical esophageal and probable esophageal dysphagia.  Recommend pill be given with pudding *start and follow with liquids* to decrease aspiration risk.  Pt did appear with marginally slow clearance of esophagus, liquids appeared to aid.  Pt also is kyphotic - and suspect this contributes to his deficits.  Using video monitor and teach back, educated pt and wife to findings/recommendations.  Of note, wife requested barium swallow to examine esophagus.   SLP Visit Diagnosis Dysphagia, pharyngoesophageal phase (R13.14);Dysphagia, oropharyngeal phase (R13.12) Attention and concentration deficit following -- Frontal lobe and executive function deficit following -- Impact on safety and function Mild aspiration risk   CHL IP TREATMENT RECOMMENDATION 07/02/2017 Treatment Recommendations Therapy as outlined in treatment plan below   Prognosis 07/02/2017 Prognosis for Safe Diet Advancement Good Barriers to Reach Goals Cognitive deficits Barriers/Prognosis Comment -- CHL IP DIET RECOMMENDATION 07/02/2017 SLP Diet Recommendations Dysphagia 3 (Mech soft)  solids;Thin liquid Liquid Administration via Cup;Straw Medication Administration Whole meds with puree Compensations Slow rate;Small sips/bites;Follow solids with liquid Postural Changes Seated upright at 90 degrees;Remain semi-upright after after feeds/meals (Comment)   CHL IP OTHER RECOMMENDATIONS 07/02/2017 Recommended Consults Other (Comment) Oral Care Recommendations Oral care BID Other Recommendations --   CHL IP FOLLOW UP RECOMMENDATIONS 05/26/2017 Follow up Recommendations Skilled Nursing facility   [facility] IP FREQUENCY AND DURATION 07/02/2017 Speech Therapy Frequency (ACUTE ONLY) min 1 x/week Treatment Duration 1 week      CHL IP ORAL PHASE 07/02/2017 Oral Phase Impaired Oral - Pudding Teaspoon -- Oral - Pudding Cup -- Oral - Honey Teaspoon -- Oral - Honey Cup -- Oral - Nectar Teaspoon Delayed oral transit;Weak lingual manipulation Oral - Nectar Cup -- Oral - Nectar Straw Delayed oral transit;Weak lingual manipulation Oral - Thin Teaspoon Delayed oral transit;Weak lingual manipulation Oral - Thin Cup -- Oral - Thin Straw Delayed oral transit;Decreased bolus cohesion;Weak lingual manipulation Oral - Puree Delayed oral transit;Decreased bolus cohesion;Weak lingual manipulation;Piecemeal swallowing Oral - Mech Soft -- Oral - Regular Decreased bolus cohesion;Weak lingual manipulation;Piecemeal swallowing Oral - Multi-Consistency -- Oral - Pill Decreased bolus cohesion;Weak lingual manipulation Oral Phase - Comment --  CHL IP PHARYNGEAL PHASE 07/02/2017 Pharyngeal Phase Impaired Pharyngeal- Pudding Teaspoon -- Pharyngeal -- Pharyngeal- Pudding Cup -- Pharyngeal -- Pharyngeal- Honey Teaspoon -- Pharyngeal -- Pharyngeal- Honey Cup -- Pharyngeal -- Pharyngeal- Nectar Teaspoon WFL Pharyngeal -- Pharyngeal- Nectar Cup -- Pharyngeal -- Pharyngeal- Nectar Straw WFL;Pharyngeal residue - valleculae Pharyngeal -- Pharyngeal- Thin Teaspoon WFL Pharyngeal -- Pharyngeal- Thin Cup -- Pharyngeal -- Pharyngeal- Thin Straw WFL;Pharyngeal  residue - valleculae Pharyngeal -- Pharyngeal- Puree WFL Pharyngeal -- Pharyngeal- Mechanical Soft -- Pharyngeal -- Pharyngeal- Regular WFL Pharyngeal -- Pharyngeal- Multi-consistency -- Pharyngeal -- Pharyngeal-  Pill Reduced tongue base retraction;Reduced epiglottic inversion;Pharyngeal residue - valleculae Pharyngeal -- Pharyngeal Comment barium tablet precariously lodged at vallecular space transiting to proximal esophagus with further, next appeared to lodge in at cervical esophagus - recommend pill be given with pudding *start and follow with liquids*  CHL IP CERVICAL ESOPHAGEAL PHASE 07/02/2017 Cervical Esophageal Phase Impaired Pudding Teaspoon -- Pudding Cup -- Honey Teaspoon -- Honey Cup -- Nectar Teaspoon WFL Nectar Cup -- Nectar Straw WFL Thin Teaspoon WFL Thin Cup -- Thin Straw WFL Puree WFL Mechanical Soft -- Regular WFL Multi-consistency -- Pill Prominent cricopharyngeal segment;Reduced cricopharyngeal relaxation Cervical Esophageal Comment suspect decreased CP relaxation and (? impact of cervical spine hardware) prompting momentary halt of barium tablet at proximal esophagus, cleared with further reflexive swallows, appearance of prominent cricopharyngeus  Luanna Salk, MS [facility] SLP [phone] Macario Golds 07/02/2017, 12:40 PM               Microbiology: Recent Results (from the past 240 hour(s))  Urine culture     Status: None   Collection Time: 07/01/17  3:25 PM  Result Value Ref Range Status   Specimen Description   Final    URINE, CATHETERIZED Performed at [facility] [address], [place], [place] [address]    Special Requests   Final    URINE, CATHETERIZED Performed at [facility], [place] [address], [place], [place] [address]    Culture   Final    NO GROWTH Performed at [facility], [place] [address], [place], [place] [address]    Report Status 07/02/2017 FINAL  Final  MRSA PCR Screening     Status: None   Collection Time:  07/02/17 12:05 AM  Result Value Ref Range Status   MRSA by PCR NEGATIVE NEGATIVE Final    Comment:        The GeneXpert MRSA Assay (FDA approved for NASAL specimens only), is one component of a comprehensive MRSA colonization surveillance program. It is not intended to diagnose MRSA infection nor to guide or monitor treatment for MRSA infections. Performed at [facility], [place] [address], [place], [place] [address]      Labs: CBC: Recent Labs  Lab 07/04/17 0531 07/05/17 0544  WBC 4.3 4.4  HGB 9.4* 9.5*  HCT 29.9* 30.1*  MCV 97.7 97.7  PLT 121* 536*   Basic Metabolic Panel: Recent Labs  Lab 07/03/17 1610 07/04/17 0531 07/05/17 0544  NA 143 139 143  K 4.7 4.0 3.8  CL 108 106 108  CO2 24 21* 23  GLUCOSE 152* 89 83  BUN 37* 36* 31*  CREATININE 1.46* 1.32* 1.24  CALCIUM 8.3* 8.2* 8.5*  MG  --  1.8  --    BNP (last 3 results) Recent Labs    07/01/17 1526  BNP 345.9*   CBG: No results for input(s): GLUCAP in the last 168 hours. Time spent: 35 minutes  Signed:  Berle Mull  [facility] 07/05/2017 , 6:48 PM

## 2017-07-11 ENCOUNTER — Encounter (HOSPITAL_COMMUNITY): Payer: Self-pay

## 2017-07-11 ENCOUNTER — Inpatient Hospital Stay (HOSPITAL_COMMUNITY)
Admission: EM | Admit: 2017-07-11 | Discharge: 2017-07-19 | DRG: 186 | Disposition: A | Discharge status: Skilled Nursing Facility | Payer: Medicare Other | Attending: Internal Medicine | Admitting: Internal Medicine

## 2017-07-11 ENCOUNTER — Emergency Department (HOSPITAL_COMMUNITY): Payer: Medicare Other

## 2017-07-11 ENCOUNTER — Other Ambulatory Visit: Payer: Self-pay

## 2017-07-11 DIAGNOSIS — N179 Acute kidney failure, unspecified: Secondary | ICD-10-CM | POA: Diagnosis present

## 2017-07-11 DIAGNOSIS — Z79899 Other long term (current) drug therapy: Secondary | ICD-10-CM | POA: Diagnosis not present

## 2017-07-11 DIAGNOSIS — K219 Gastro-esophageal reflux disease without esophagitis: Secondary | ICD-10-CM | POA: Diagnosis present

## 2017-07-11 DIAGNOSIS — I130 Hypertensive heart and chronic kidney disease with heart failure and stage 1 through stage 4 chronic kidney disease, or unspecified chronic kidney disease: Secondary | ICD-10-CM | POA: Diagnosis present

## 2017-07-11 DIAGNOSIS — I5032 Chronic diastolic (congestive) heart failure: Secondary | ICD-10-CM | POA: Diagnosis not present

## 2017-07-11 DIAGNOSIS — Z96653 Presence of artificial knee joint, bilateral: Secondary | ICD-10-CM | POA: Diagnosis present

## 2017-07-11 DIAGNOSIS — I2510 Atherosclerotic heart disease of native coronary artery without angina pectoris: Secondary | ICD-10-CM | POA: Diagnosis present

## 2017-07-11 DIAGNOSIS — Z515 Encounter for palliative care: Secondary | ICD-10-CM | POA: Diagnosis present

## 2017-07-11 DIAGNOSIS — Z7952 Long term (current) use of systemic steroids: Secondary | ICD-10-CM | POA: Diagnosis not present

## 2017-07-11 DIAGNOSIS — R0989 Other specified symptoms and signs involving the circulatory and respiratory systems: Secondary | ICD-10-CM

## 2017-07-11 DIAGNOSIS — Z888 Allergy status to other drugs, medicaments and biological substances status: Secondary | ICD-10-CM

## 2017-07-11 DIAGNOSIS — E039 Hypothyroidism, unspecified: Secondary | ICD-10-CM | POA: Diagnosis present

## 2017-07-11 DIAGNOSIS — R531 Weakness: Secondary | ICD-10-CM

## 2017-07-11 DIAGNOSIS — G40909 Epilepsy, unspecified, not intractable, without status epilepticus: Secondary | ICD-10-CM | POA: Diagnosis present

## 2017-07-11 DIAGNOSIS — G9340 Encephalopathy, unspecified: Secondary | ICD-10-CM | POA: Diagnosis present

## 2017-07-11 DIAGNOSIS — F0390 Unspecified dementia without behavioral disturbance: Secondary | ICD-10-CM | POA: Diagnosis present

## 2017-07-11 DIAGNOSIS — J9811 Atelectasis: Secondary | ICD-10-CM | POA: Diagnosis present

## 2017-07-11 DIAGNOSIS — N183 Chronic kidney disease, stage 3 (moderate): Secondary | ICD-10-CM | POA: Diagnosis not present

## 2017-07-11 DIAGNOSIS — E7800 Pure hypercholesterolemia, unspecified: Secondary | ICD-10-CM | POA: Diagnosis present

## 2017-07-11 DIAGNOSIS — D509 Iron deficiency anemia, unspecified: Secondary | ICD-10-CM | POA: Diagnosis present

## 2017-07-11 DIAGNOSIS — I5033 Acute on chronic diastolic (congestive) heart failure: Secondary | ICD-10-CM | POA: Diagnosis present

## 2017-07-11 DIAGNOSIS — Z66 Do not resuscitate: Secondary | ICD-10-CM | POA: Diagnosis present

## 2017-07-11 DIAGNOSIS — I480 Paroxysmal atrial fibrillation: Secondary | ICD-10-CM | POA: Diagnosis present

## 2017-07-11 DIAGNOSIS — G4730 Sleep apnea, unspecified: Secondary | ICD-10-CM | POA: Diagnosis present

## 2017-07-11 DIAGNOSIS — J9601 Acute respiratory failure with hypoxia: Secondary | ICD-10-CM | POA: Diagnosis present

## 2017-07-11 DIAGNOSIS — G9341 Metabolic encephalopathy: Secondary | ICD-10-CM | POA: Diagnosis present

## 2017-07-11 DIAGNOSIS — E785 Hyperlipidemia, unspecified: Secondary | ICD-10-CM | POA: Diagnosis present

## 2017-07-11 DIAGNOSIS — Z7901 Long term (current) use of anticoagulants: Secondary | ICD-10-CM | POA: Diagnosis not present

## 2017-07-11 DIAGNOSIS — I481 Persistent atrial fibrillation: Secondary | ICD-10-CM | POA: Diagnosis present

## 2017-07-11 DIAGNOSIS — N1831 Chronic kidney disease, stage 3a: Secondary | ICD-10-CM | POA: Diagnosis present

## 2017-07-11 DIAGNOSIS — Z7983 Long term (current) use of bisphosphonates: Secondary | ICD-10-CM

## 2017-07-11 DIAGNOSIS — Z9889 Other specified postprocedural states: Secondary | ICD-10-CM

## 2017-07-11 DIAGNOSIS — J90 Pleural effusion, not elsewhere classified: Principal | ICD-10-CM | POA: Diagnosis present

## 2017-07-11 DIAGNOSIS — Z85828 Personal history of other malignant neoplasm of skin: Secondary | ICD-10-CM

## 2017-07-11 DIAGNOSIS — I482 Chronic atrial fibrillation, unspecified: Secondary | ICD-10-CM | POA: Diagnosis present

## 2017-07-11 DIAGNOSIS — Z79890 Hormone replacement therapy (postmenopausal): Secondary | ICD-10-CM

## 2017-07-11 DIAGNOSIS — R0902 Hypoxemia: Secondary | ICD-10-CM

## 2017-07-11 DIAGNOSIS — R0600 Dyspnea, unspecified: Secondary | ICD-10-CM

## 2017-07-11 DIAGNOSIS — I10 Essential (primary) hypertension: Secondary | ICD-10-CM | POA: Diagnosis present

## 2017-07-11 DIAGNOSIS — T17900D Unspecified foreign body in respiratory tract, part unspecified causing asphyxiation, subsequent encounter: Secondary | ICD-10-CM

## 2017-07-11 LAB — URINALYSIS, ROUTINE W REFLEX MICROSCOPIC
Bilirubin Urine: NEGATIVE
Glucose, UA: NEGATIVE mg/dL
Hgb urine dipstick: NEGATIVE
Ketones, ur: 5 mg/dL — AB
Leukocytes, UA: NEGATIVE
Nitrite: NEGATIVE
Protein, ur: NEGATIVE mg/dL
Specific Gravity, Urine: 1.015 (ref 1.005–1.030)
pH: 5 (ref 5.0–8.0)

## 2017-07-11 LAB — HEPATIC FUNCTION PANEL
ALT: 14 U/L — ABNORMAL LOW (ref 17–63)
AST: 16 U/L (ref 15–41)
Albumin: 3.1 g/dL — ABNORMAL LOW (ref 3.5–5.0)
Alkaline Phosphatase: 45 U/L (ref 38–126)
Bilirubin, Direct: 0.2 mg/dL (ref 0.1–0.5)
Indirect Bilirubin: 0.7 mg/dL (ref 0.3–0.9)
Total Bilirubin: 0.9 mg/dL (ref 0.3–1.2)
Total Protein: 5.5 g/dL — ABNORMAL LOW (ref 6.5–8.1)

## 2017-07-11 LAB — BLOOD GAS, VENOUS
Acid-Base Excess: 0.7 mmol/L (ref 0.0–2.0)
Bicarbonate: 25 mmol/L (ref 20.0–28.0)
FIO2: 21
O2 Saturation: 64.8 %
Patient temperature: 98.7
pCO2, Ven: 41.2 mmHg — ABNORMAL LOW (ref 44.0–60.0)
pH, Ven: 7.401 (ref 7.250–7.430)
pO2, Ven: 37.8 mmHg (ref 32.0–45.0)

## 2017-07-11 LAB — CBC WITH DIFFERENTIAL/PLATELET
Basophils Absolute: 0 10*3/uL (ref 0.0–0.1)
Basophils Relative: 0 %
Eosinophils Absolute: 0 10*3/uL (ref 0.0–0.7)
Eosinophils Relative: 1 %
HCT: 30 % — ABNORMAL LOW (ref 39.0–52.0)
Hemoglobin: 9.4 g/dL — ABNORMAL LOW (ref 13.0–17.0)
Lymphocytes Relative: 12 %
Lymphs Abs: 0.9 10*3/uL (ref 0.7–4.0)
MCH: 30.2 pg (ref 26.0–34.0)
MCHC: 31.3 g/dL (ref 30.0–36.0)
MCV: 96.5 fL (ref 78.0–100.0)
Monocytes Absolute: 1.2 10*3/uL — ABNORMAL HIGH (ref 0.1–1.0)
Monocytes Relative: 15 %
Neutro Abs: 5.5 10*3/uL (ref 1.7–7.7)
Neutrophils Relative %: 72 %
Platelets: 115 10*3/uL — ABNORMAL LOW (ref 150–400)
RBC: 3.11 MIL/uL — ABNORMAL LOW (ref 4.22–5.81)
RDW: 15.3 % (ref 11.5–15.5)
WBC: 7.6 10*3/uL (ref 4.0–10.5)

## 2017-07-11 LAB — BASIC METABOLIC PANEL
Anion gap: 12 (ref 5–15)
BUN: 30 mg/dL — ABNORMAL HIGH (ref 6–20)
CO2: 23 mmol/L (ref 22–32)
Calcium: 8.2 mg/dL — ABNORMAL LOW (ref 8.9–10.3)
Chloride: 103 mmol/L (ref 101–111)
Creatinine, Ser: 1.48 mg/dL — ABNORMAL HIGH (ref 0.61–1.24)
GFR calc Af Amer: 49 mL/min — ABNORMAL LOW (ref >60)
GFR calc non Af Amer: 43 mL/min — ABNORMAL LOW (ref >60)
Glucose, Bld: 87 mg/dL (ref 65–99)
Potassium: 3.8 mmol/L (ref 3.5–5.1)
Sodium: 138 mmol/L (ref 135–145)

## 2017-07-11 LAB — HIV ANTIBODY (ROUTINE TESTING W REFLEX): HIV Screen 4th Generation wRfx: NONREACTIVE

## 2017-07-11 LAB — STREP PNEUMONIAE URINARY ANTIGEN: Strep Pneumo Urinary Antigen: NEGATIVE

## 2017-07-11 LAB — MAGNESIUM: Magnesium: 1.7 mg/dL (ref 1.7–2.4)

## 2017-07-11 LAB — VALPROIC ACID LEVEL: Valproic Acid Lvl: 73 ug/mL (ref 50.0–100.0)

## 2017-07-11 LAB — BRAIN NATRIURETIC PEPTIDE: B Natriuretic Peptide: 309.9 pg/mL — ABNORMAL HIGH (ref 0.0–100.0)

## 2017-07-11 LAB — PROCALCITONIN: Procalcitonin: <0.1 ng/mL

## 2017-07-11 MED ORDER — VITAMIN C 500 MG PO TABS
500.0000 mg | ORAL_TABLET | Freq: Every morning | ORAL | Status: DC
  Filled 2017-07-11: qty 1

## 2017-07-11 MED ORDER — SENNA 8.6 MG PO TABS
1.0000 | ORAL_TABLET | Freq: Two times a day (BID) | ORAL | Status: DC
  Filled 2017-07-11: qty 1

## 2017-07-11 MED ORDER — CALCIUM CARBONATE 1250 (500 CA) MG PO TABS
1250.0000 mg | ORAL_TABLET | Freq: Two times a day (BID) | ORAL | Status: DC
  Administered 2017-07-12 – 2017-07-19 (×15): 1250 mg via ORAL
  Filled 2017-07-11 (×16): qty 1

## 2017-07-11 MED ORDER — PRO-STAT SUGAR FREE PO LIQD
30.0000 mL | Freq: Two times a day (BID) | ORAL | Status: DC
  Administered 2017-07-11 – 2017-07-19 (×14): 30 mL via ORAL
  Filled 2017-07-11 (×15): qty 30

## 2017-07-11 MED ORDER — SODIUM CHLORIDE 0.9 % IV SOLN: INTRAVENOUS | Status: DC

## 2017-07-11 MED ORDER — DIVALPROEX SODIUM 250 MG PO DR TAB
750.0000 mg | DELAYED_RELEASE_TABLET | Freq: Every day | ORAL | Status: DC
  Administered 2017-07-11 – 2017-07-18 (×8): 750 mg via ORAL
  Filled 2017-07-11 (×8): qty 3

## 2017-07-11 MED ORDER — GABAPENTIN 300 MG PO CAPS
300.0000 mg | ORAL_CAPSULE | Freq: Two times a day (BID) | ORAL | Status: DC
  Administered 2017-07-12 – 2017-07-19 (×15): 300 mg via ORAL
  Filled 2017-07-11 (×17): qty 1

## 2017-07-11 MED ORDER — DIVALPROEX SODIUM 250 MG PO DR TAB
250.0000 mg | DELAYED_RELEASE_TABLET | Freq: Every day | ORAL | Status: DC
  Administered 2017-07-11 – 2017-07-19 (×9): 250 mg via ORAL
  Filled 2017-07-11 (×9): qty 1

## 2017-07-11 MED ORDER — VITAMIN A 10000 UNITS PO CAPS
10000.0000 [IU] | ORAL_CAPSULE | Freq: Every day | ORAL | Status: DC
  Filled 2017-07-11: qty 1

## 2017-07-11 MED ORDER — ADULT MULTIVITAMIN W/MINERALS CH: 1.0000 | ORAL_TABLET | Freq: Every day | ORAL | Status: DC

## 2017-07-11 MED ORDER — PREDNISONE 20 MG PO TABS
10.0000 mg | ORAL_TABLET | Freq: Every day | ORAL | Status: DC
  Administered 2017-07-11 – 2017-07-19 (×9): 10 mg via ORAL
  Filled 2017-07-11 (×10): qty 1

## 2017-07-11 MED ORDER — APIXABAN 5 MG PO TABS
5.0000 mg | ORAL_TABLET | Freq: Two times a day (BID) | ORAL | Status: DC
  Administered 2017-07-11 – 2017-07-12 (×3): 5 mg via ORAL
  Filled 2017-07-11 (×3): qty 1

## 2017-07-11 MED ORDER — ENSURE ENLIVE PO LIQD
237.0000 mL | Freq: Three times a day (TID) | ORAL | Status: DC
  Administered 2017-07-12 – 2017-07-19 (×11): 237 mL via ORAL

## 2017-07-11 MED ORDER — ZINC SULFATE 220 (50 ZN) MG PO CAPS
220.0000 mg | ORAL_CAPSULE | Freq: Every morning | ORAL | Status: DC
  Filled 2017-07-11: qty 1

## 2017-07-11 MED ORDER — LEVOTHYROXINE SODIUM 150 MCG PO TABS
150.0000 ug | ORAL_TABLET | Freq: Every day | ORAL | Status: DC
  Administered 2017-07-11 – 2017-07-19 (×9): 150 ug via ORAL
  Filled 2017-07-11 (×9): qty 1

## 2017-07-11 MED ORDER — ACETAMINOPHEN 325 MG PO TABS
650.0000 mg | ORAL_TABLET | ORAL | Status: DC | PRN
  Administered 2017-07-11: 650 mg via ORAL
  Filled 2017-07-11: qty 2

## 2017-07-11 MED ORDER — METOPROLOL TARTRATE 25 MG PO TABS
25.0000 mg | ORAL_TABLET | Freq: Two times a day (BID) | ORAL | Status: DC
  Administered 2017-07-11 – 2017-07-19 (×17): 25 mg via ORAL
  Filled 2017-07-11 (×17): qty 1

## 2017-07-11 MED ORDER — EZETIMIBE 10 MG PO TABS
5.0000 mg | ORAL_TABLET | Freq: Every day | ORAL | Status: DC
  Administered 2017-07-11 – 2017-07-18 (×8): 5 mg via ORAL
  Filled 2017-07-11 (×8): qty 0.5

## 2017-07-11 MED ORDER — HYDROCODONE-ACETAMINOPHEN 10-325 MG PO TABS
1.0000 | ORAL_TABLET | Freq: Four times a day (QID) | ORAL | Status: DC | PRN
  Administered 2017-07-14 – 2017-07-19 (×10): 1 via ORAL
  Filled 2017-07-11 (×10): qty 1

## 2017-07-11 MED ORDER — COLLAGENASE 250 UNIT/GM EX OINT
TOPICAL_OINTMENT | Freq: Every day | CUTANEOUS | Status: DC
  Administered 2017-07-12: 11:00:00 via TOPICAL
  Filled 2017-07-11: qty 90

## 2017-07-11 MED ORDER — SODIUM CHLORIDE 0.45 % IV SOLN
INTRAVENOUS | Status: DC
  Administered 2017-07-11: 07:00:00 via INTRAVENOUS

## 2017-07-11 MED ORDER — DOXAZOSIN MESYLATE 4 MG PO TABS
4.0000 mg | ORAL_TABLET | Freq: Every day | ORAL | Status: DC
  Administered 2017-07-11 – 2017-07-19 (×9): 4 mg via ORAL
  Filled 2017-07-11 (×9): qty 1

## 2017-07-11 MED ORDER — SENNOSIDES 8.6 MG PO TABS: 1.0000 | ORAL_TABLET | Freq: Two times a day (BID) | ORAL | Status: DC

## 2017-07-11 MED ORDER — FERROUS SULFATE 325 (65 FE) MG PO TABS
325.0000 mg | ORAL_TABLET | Freq: Two times a day (BID) | ORAL | Status: DC
  Administered 2017-07-12 – 2017-07-19 (×15): 325 mg via ORAL
  Filled 2017-07-11 (×16): qty 1

## 2017-07-11 MED ORDER — DILTIAZEM HCL ER COATED BEADS 300 MG PO CP24
300.0000 mg | ORAL_CAPSULE | Freq: Every day | ORAL | Status: DC
Start: 2017-07-11 — End: 2017-07-17
  Administered 2017-07-11 – 2017-07-16 (×6): 300 mg via ORAL
  Filled 2017-07-11 (×7): qty 1

## 2017-07-11 MED ORDER — VITAMIN D3 25 MCG (1000 UNIT) PO TABS
1000.0000 [IU] | ORAL_TABLET | Freq: Two times a day (BID) | ORAL | Status: DC
  Administered 2017-07-12 – 2017-07-19 (×15): 1000 [IU] via ORAL
  Filled 2017-07-11 (×17): qty 1

## 2017-07-11 MED ORDER — SODIUM CHLORIDE 0.9 % IV BOLUS
500.0000 mL | Freq: Once | INTRAVENOUS | Status: AC
  Administered 2017-07-11: 500 mL via INTRAVENOUS

## 2017-07-11 MED ORDER — DOCUSATE SODIUM 100 MG PO CAPS
200.0000 mg | ORAL_CAPSULE | Freq: Two times a day (BID) | ORAL | Status: DC
  Filled 2017-07-11: qty 2

## 2017-07-11 MED ORDER — PRAVASTATIN SODIUM 20 MG PO TABS
40.0000 mg | ORAL_TABLET | Freq: Every day | ORAL | Status: DC
Start: 2017-07-11 — End: 2017-07-19
  Administered 2017-07-11 – 2017-07-18 (×8): 40 mg via ORAL
  Filled 2017-07-11 (×8): qty 2

## 2017-07-11 MED ORDER — CYANOCOBALAMIN 500 MCG PO TABS
500.0000 ug | ORAL_TABLET | Freq: Every day | ORAL | Status: DC
  Administered 2017-07-12 – 2017-07-18 (×7): 500 ug via ORAL
  Filled 2017-07-11 (×8): qty 1

## 2017-07-11 MED ORDER — FUROSEMIDE 40 MG PO TABS: 20.0000 mg | ORAL_TABLET | Freq: Every day | ORAL | Status: DC

## 2017-07-11 MED ORDER — VITAMIN A 8000 UNITS PO CAPS: 8000.0000 [IU] | ORAL_CAPSULE | Freq: Two times a day (BID) | ORAL | Status: DC

## 2017-07-11 MED ORDER — PANTOPRAZOLE SODIUM 40 MG PO TBEC
40.0000 mg | DELAYED_RELEASE_TABLET | Freq: Every day | ORAL | Status: DC
  Administered 2017-07-11 – 2017-07-19 (×9): 40 mg via ORAL
  Filled 2017-07-11 (×10): qty 1

## 2017-07-11 MED ORDER — VITAMIN D-3 25 MCG (1000 UT) PO CAPS: 1000.0000 [IU] | ORAL_CAPSULE | Freq: Two times a day (BID) | ORAL | Status: DC

## 2017-07-11 NOTE — ED Notes (Signed)
ED TO INPATIENT HANDOFF REPORT  Name/Age/Gender [name] 84 y.o. male  Code Status Code Status History    Date Active Date Inactive Code Status Order ID Comments User Context   07/03/2017 1232 07/05/2017 1736 DNR [id number]  [name], NP Inpatient   07/01/2017 2127 07/03/2017 1232 Full Code [id number]  [name], MD Inpatient   05/24/2017 0348 06/01/2017 2026 Full Code [id number]  [name], MD ED   04/17/2017 2317 04/25/2017 1955 Full Code [id number]  [name], MD ED   07/06/2015 0919 07/11/2015 1902 Full Code [id number]  [name], MD ED   01/31/2015 1837 02/05/2015 1735 Full Code [id number]  [name], MD Inpatient   01/28/2013 2137 01/31/2013 1359 Full Code [id number]  [name], MD ED   01/28/2012 1808 01/30/2012 1847 Full Code [id number]  [name], [name], RN Inpatient    Questions for Most Recent Historical Code Status (Order [id number])    Question Answer Comment   In the event of cardiac or respiratory ARREST Do not call a "code blue"    In the event of cardiac or respiratory ARREST Do not perform Intubation, CPR, defibrillation or ACLS    In the event of cardiac or respiratory ARREST Use medication by any route, position, wound care, and other measures to relive pain and suffering. May use oxygen, suction and manual treatment of airway obstruction as needed for comfort.       Home/SNF/Other Home  Chief Complaint Weakness  Level of Care/Admitting Diagnosis ED Disposition    ED Disposition Condition Comment   Admit  The patient appears reasonably stabilized for admission considering the current resources, flow, and capabilities available in the ED at this time, and I doubt any other [facility] requiring further screening and/or treatment in the ED prior to admission is  present.       Medical History Past Medical History:  Diagnosis Date  . Abnormality of gait 09/22/2015  . Arthritis   . Atrial fibrillation ([place])   . CAD  (coronary artery disease)    Stent to RCA, Penta stent, 99% reduced to 0% 2002.  . Cancer ([place])    skin CA removed from back  . Chronic insomnia 03/30/2015  . Complication of anesthesia    trouble waking up  . Constipation    from medications taken  . GERD (gastroesophageal reflux disease)   . Hypercholesteremia   . Hypertension   . Hypothyroidism   . Memory difficulty 09/22/2015  . Osteoarthritis   . Seizures ([place])   . Sepsis ([place]) 05/2017  . Transient alteration of awareness 03/30/2015  . Vertigo    hx of    Allergies Allergies  Allergen Reactions  . Demerol [Meperidine] Nausea And Vomiting  . Keppra [Levetiracetam] Other (See Comments)    Causes sleepiness    IV Location/Drains/Wounds Patient Lines/Drains/Airways Status   Active Line/Drains/Airways    Name:   Placement date:   Placement time:   Site:   Days:   Peripheral IV 07/11/17 Left Forearm   07/11/17    0100    Forearm   less than 1   Incision 01/28/12 Neck Other (Comment)   01/28/12    1611     1991   Incision 01/29/13 Neck Other (Comment)   01/29/13    1921     1624   Incision (Closed) 05/10/15 Finger (Comment which one) Right   05/10/15    1751     793   Wound /  Incision (Open or Dehisced) 05/24/17 Venous stasis ulcer Leg Right;Medial;Lower   05/24/17    1809    Leg   48   Wound / Incision (Open or Dehisced) 05/24/17 Non-pressure wound Toe (Comment  which one) Left;Distal   05/24/17    1814    Toe (Comment  which one)   48   Wound / Incision (Open or Dehisced) 05/24/17 Non-pressure wound Toe (Comment  which one) Right;Distal   05/24/17    1815    Toe (Comment  which one)   48          Labs/Imaging Results for orders placed or performed during the hospital encounter of 07/11/17 (from the past 48 hour(s))  Blood gas, venous     Status: Abnormal   Collection Time: 07/11/17  3:45 AM  Result Value Ref Range   FIO2 21.00    Delivery systems ROOM AIR    pH, Ven 7.401 7.250 - 7.430   pCO2, Ven 41.2 (L) 44.0 -  60.0 mmHg   pO2, Ven 37.8 32.0 - 45.0 mmHg   Bicarbonate 25.0 20.0 - 28.0 mmol/L   Acid-Base Excess 0.7 0.0 - 2.0 mmol/L   O2 Saturation 64.8 %   Patient temperature 98.7    Collection site VEIN    Drawn by COLLECTED BY NURSE    Sample type VENOUS     Comment: Performed at [facility], [place] [address], [place], [place] [address]  Basic metabolic panel     Status: Abnormal   Collection Time: 07/11/17  3:46 AM  Result Value Ref Range   Sodium 138 135 - 145 mmol/L   Potassium 3.8 3.5 - 5.1 mmol/L   Chloride 103 101 - 111 mmol/L   CO2 23 22 - 32 mmol/L   Glucose, Bld 87 65 - 99 mg/dL   BUN 30 (H) 6 - 20 mg/dL   Creatinine, Ser 1.48 (H) 0.61 - 1.24 mg/dL   Calcium 8.2 (L) 8.9 - 10.3 mg/dL   GFR calc non Af Amer 43 (L) >60 mL/min   GFR calc Af Amer 49 (L) >60 mL/min    Comment: (NOTE) The eGFR has been calculated using the CKD EPI equation. This calculation has not been validated in all clinical situations. eGFR's persistently <60 mL/min signify possible Chronic Kidney Disease.    Anion gap 12 5 - 15    Comment: Performed at [facility], [place] [address], [place], [place] [address]  CBC with Differential     Status: Abnormal   Collection Time: 07/11/17  3:46 AM  Result Value Ref Range   WBC 7.6 4.0 - 10.5 K/uL   RBC 3.11 (L) 4.22 - 5.81 MIL/uL   Hemoglobin 9.4 (L) 13.0 - 17.0 g/dL   HCT 30.0 (L) 39.0 - 52.0 %   MCV 96.5 78.0 - 100.0 fL   MCH 30.2 26.0 - 34.0 pg   MCHC 31.3 30.0 - 36.0 g/dL   RDW 15.3 11.5 - 15.5 %   Platelets 115 (L) 150 - 400 K/uL    Comment: REPEATED TO VERIFY SPECIMEN CHECKED FOR CLOTS PLATELET COUNT CONFIRMED BY SMEAR    Neutrophils Relative % 72 %   Neutro Abs 5.5 1.7 - 7.7 K/uL   Lymphocytes Relative 12 %   Lymphs Abs 0.9 0.7 - 4.0 K/uL   Monocytes Relative 15 %   Monocytes Absolute 1.2 (H) 0.1 - 1.0 K/uL   Eosinophils Relative 1 %   Eosinophils Absolute 0.0 0.0 - 0.7  K/uL   Basophils Relative 0 %    Basophils Absolute 0.0 0.0 - 0.1 K/uL    Comment: Performed at [facility], [place] [address], [place], [place] [address]  Urinalysis, Routine w reflex microscopic     Status: Abnormal   Collection Time: 07/11/17  3:46 AM  Result Value Ref Range   Color, Urine YELLOW YELLOW   APPearance CLEAR CLEAR   Specific Gravity, Urine 1.015 1.005 - 1.030   pH 5.0 5.0 - 8.0   Glucose, UA NEGATIVE NEGATIVE mg/dL   Hgb urine dipstick NEGATIVE NEGATIVE   Bilirubin Urine NEGATIVE NEGATIVE   Ketones, ur 5 (A) NEGATIVE mg/dL   Protein, ur NEGATIVE NEGATIVE mg/dL   Nitrite NEGATIVE NEGATIVE   Leukocytes, UA NEGATIVE NEGATIVE    Comment: Performed at [place] [address], [place], [place] [address]   Dg Chest Port 1 View  Result Date: 07/11/2017 CLINICAL DATA:  Shortness of breath.  Choking. EXAM: PORTABLE CHEST 1 VIEW COMPARISON:  07/01/2017 FINDINGS: Shallow inspiration. Consolidation or atelectasis in the lung bases with small bilateral pleural effusions. Probable increased since previous study although comparison is difficult due to differences in technique. Cardiac enlargement. Degenerative changes in the spine. IMPRESSION: Consolidation or atelectasis in the lung bases with small bilateral pleural effusions appears to be increasing. Cardiac enlargement. Electronically Signed   By: Lucienne Capers M.D.   On: 07/11/2017 04:05    Pending Labs Unresulted Labs (From admission, onward)   Start     Ordered   07/11/17 0523  Procalcitonin - Baseline  STAT,   STAT     07/11/17 0522   07/11/17 0522  Brain natriuretic peptide  Once,   R     07/11/17 0522      Vitals/Pain Today's Vitals   07/11/17 0115 07/11/17 0200 07/11/17 0300 07/11/17 0400  BP:  127/65 137/71 (!) 142/67  Pulse:  85 76 93  Resp:  '17 15 14  '$ Temp:      TempSrc:      SpO2:  96% 95% (!) 85%  PainSc: 0-No pain       Isolation Precautions No active isolations  Medications Medications   sodium chloride 0.9 % bolus 500 mL (0 mLs Intravenous Stopped 07/11/17 0457)    Mobility non-ambulatory

## 2017-07-11 NOTE — ED Triage Notes (Addendum)
Pt seen at PCP tuesday  for follow up post discharged was WNL at that time, wife reports that all day Wednesday pt slow decline as non focal weakness increased. Wife feels that pt is dehydrated and was unable to assist with his own care over the last  Couple of days. Pt is full DNR

## 2017-07-11 NOTE — ED Notes (Signed)
Bed: GI71 Expected date:  Expected time:  Means of arrival:  Comments: 84 yr old lethargic/pneumonia

## 2017-07-11 NOTE — ED Notes (Signed)
Lab to add on procalcitonin and BNP

## 2017-07-11 NOTE — ED Provider Notes (Signed)
[facility] DEPT Provider Note   CSN: [id number] Arrival date & time: 07/11/17  0101     History   Chief Complaint Chief Complaint  Patient presents with  . Weakness    generalized    HPI LEOBARDO GRANLUND is a 84 y.o. male.  HPI   Patient is here for evaluation of weakness, associated with tiredness, and slumping forward, tonight when his wife was trying to get him to the bathroom.  Patient has been having waxing and waning altered mental status and weakness, for 2 months, during which time he has been diagnosed with aspiration pneumonia.  He was discharged from the hospital, 4 days ago, after admission for altered mental status with possible aspiration pneumonia.  He saw his PCP, 2 days ago for evaluation.  No changes in plan were made at that time.  He is reportedly had at home oxygen testing, indicating periods of hypoxia.  He has been previously diagnosed with sleep apnea, but has not tolerated CPAP.  His PCP is considering repeating the test for sleep apnea.  He is cared for at home by his wife.  He is seeing a wound care doctor for a right ankle wound, and having dressing changes daily.  He was last at the wound center several days ago.  There is been no fever, cough, vomiting, or complain of pain.  Patient is unable to give history.  Level 5 caveat-altered mental status  Past Medical History:  Diagnosis Date  . Abnormality of gait 09/22/2015  . Arthritis   . Atrial fibrillation ([place])   . CAD (coronary artery disease)    Stent to RCA, Penta stent, 99% reduced to 0% 2002.  . Cancer ([place])    skin CA removed from back  . Chronic insomnia 03/30/2015  . Complication of anesthesia    trouble waking up  . Constipation    from medications taken  . GERD (gastroesophageal reflux disease)   . Hypercholesteremia   . Hypertension   . Hypothyroidism   . Memory difficulty 09/22/2015  . Osteoarthritis   . Seizures ([place])   . Sepsis ([place]) 05/2017  .  Transient alteration of awareness 03/30/2015  . Vertigo    hx of    Patient Active Problem List   Diagnosis Date Noted  . Aspiration pneumonia ([place]) 07/01/2017  . Acute encephalopathy 07/01/2017  . Thrombocytopenia ([place]) 07/01/2017  . Chronic diastolic CHF (congestive heart failure) ([place]) 07/01/2017  . Anemia of chronic disease 07/01/2017  . Troponin level elevated 07/01/2017  . PAF (paroxysmal atrial fibrillation) ([place]) 07/01/2017  . Goals of care, counseling/discussion   . Palliative care by specialist   . Acute metabolic encephalopathy 94/17/4081  . CKD (chronic kidney disease) stage 3, GFR 30-59 ml/min (HCC)   . CAP (community acquired pneumonia) 04/17/2017  . Atrial fibrillation with RVR ([place]) 04/17/2017  . Hallux rigidus, right foot 03/10/2016  . Lumbosacral spondylosis without myelopathy 10/29/2015  . Memory difficulty 09/22/2015  . Abnormality of gait 09/22/2015  . Hyperglycemia 07/06/2015  . Chronic pain 07/06/2015  . Chronic insomnia 03/30/2015  . Transient alteration of awareness 03/30/2015  . Abnormal liver function   . Altered mental status 01/31/2015  . Essential hypertension 01/31/2015  . Constipation 01/31/2015  . Hypothyroidism 01/31/2015  . Seizure disorder ([place]) 01/31/2015  . Bladder outlet obstruction 01/31/2015  . GERD (gastroesophageal reflux disease) 01/31/2015  . Chronic back pain 01/31/2015  . Acute kidney injury ([place]) 01/31/2015    Past Surgical History:  Procedure  Laterality Date  . BACK SURGERY    . EYE SURGERY     Bilateral Cataract surgery   . HERNIA REPAIR    . I&D EXTREMITY Right 05/10/2015   Procedure: IRRIGATION AND DEBRIDEMENT EXTREMITY;  Surgeon: Leanora Cover, MD;  Location: [facility];  Service: Orthopedics;  Laterality: Right;  . KNEE ARTHROPLASTY     right knee X 2; left knee once  . LAMINECTOMY     X 6  . POSTERIOR CERVICAL FUSION/FORAMINOTOMY  01/28/2012   Procedure: POSTERIOR CERVICAL FUSION/FORAMINOTOMY LEVEL  3;  Surgeon: [name], MD;  Location: [facility];  Service: Neurosurgery;  Laterality: Left;  Posterior Cervical Five-Thoracic One Fusion, Arthrodesis with LEFT Cervical Seven-thoracic One Laminectomy, Foraminotomy and Resection of Synovial Cyst  . POSTERIOR CERVICAL FUSION/FORAMINOTOMY N/A 01/29/2013   Procedure: POSTERIOR CERVICAL FUSION/FORAMINOTOMY LEVEL 1 and C2-5 Posteriolateral Arthrodesis;  Surgeon: [name], MD;  Location: [facility];  Service: Neurosurgery;  Laterality: N/A;  C2-C3 Laminectomy C2-C3 posterior cervical arthrodesis  . TONSILLECTOMY          Home Medications    Prior to Admission medications   Medication Sig Start Date End Date Taking? Authorizing Provider  acetaminophen (TYLENOL) 325 MG tablet Take 650 mg by mouth every 4 (four) hours as needed for mild pain.   Yes [provider]  alendronate (FOSAMAX) 70 MG tablet Take 70 mg by mouth once a week.  01/06/12  Yes [provider]  Amino Acids-Protein Hydrolys (FEEDING SUPPLEMENT, PRO-STAT SUGAR FREE 64,) LIQD Take 30 mLs by mouth 2 (two) times daily. 07/05/17  Yes Lavina Hamman, MD  apixaban (ELIQUIS) 5 MG TABS tablet Take 1 tablet (5 mg total) by mouth 2 (two) times daily. 06/28/17  Yes Skeet Latch, MD  calcium carbonate (OS-CAL - DOSED IN MG OF ELEMENTAL CALCIUM) 1250 (500 Ca) MG tablet Take 1 tablet (1,250 mg total) by mouth 2 (two) times daily with a meal. 06/01/17  Yes Mikhail, Velta Addison, DO  Cholecalciferol (VITAMIN D-3) 1000 units CAPS Take 1,000 Units by mouth 2 (two) times daily.   Yes [provider]  Coenzyme Q10 (COQ10) 200 MG CAPS Take 200 mg by mouth at bedtime.   Yes [provider]  collagenase (SANTYL) ointment Apply topically daily. 06/02/17  Yes Mikhail, Maryann, DO  cyanocobalamin 500 MCG tablet Take 500 mcg by mouth at bedtime.   Yes [provider]  diltiazem (CARDIZEM CD) 300 MG 24 hr capsule Take 1 capsule (300 mg total) by  mouth daily. 06/28/17  Yes Skeet Latch, MD  divalproex (DEPAKOTE) 250 MG DR tablet Take 3 tablets (750 mg total) by mouth every 12 (twelve) hours. Patient taking differently: Take 250-750 mg by mouth 2 (two) times daily. Take 1 tablet (250 mg) in the morning and Take 3 tablets (750 mg) in the evening. 07/09/15  Yes Robbie Lis, MD  docusate sodium (COLACE) 100 MG capsule Take 200 mg by mouth 2 (two) times daily.    Yes [provider]  doxazosin (CARDURA) 4 MG tablet Take 1 tablet (4 mg total) by mouth daily. 06/28/17  Yes Skeet Latch, MD  feeding supplement, ENSURE ENLIVE, (ENSURE ENLIVE) LIQD Take 237 mLs by mouth 3 (three) times daily between meals. 06/01/17  Yes Mikhail, Velta Addison, DO  ferrous sulfate 325 (65 FE) MG tablet Take 1 tablet (325 mg total) by mouth 2 (two) times daily with a meal. 06/28/17  Yes Skeet Latch, MD  furosemide (LASIX) 20 MG tablet  Take 1 tablet (20 mg total) by mouth daily. 07/05/17  Yes Lavina Hamman, MD  gabapentin (NEURONTIN) 300 MG capsule Take 300 mg by mouth 2 (two) times daily.   Yes [provider]  HYDROcodone-acetaminophen (NORCO) 10-325 MG tablet Take 1 tablet by mouth every 6 (six) hours as needed for moderate pain. 04/25/17  Yes Lavina Hamman, MD  levothyroxine (SYNTHROID, LEVOTHROID) 150 MCG tablet Take 150 mcg by mouth daily before breakfast. 12/27/11  Yes [provider]  lidocaine (LIDODERM) 5 % Place 1 patch onto the skin daily as needed. Remove & Discard patch within 12 hours or as directed by MD 06/01/17  Yes Mikhail, Velta Addison, DO  metoprolol tartrate (LOPRESSOR) 25 MG tablet Take 1 tablet (25 mg total) by mouth 2 (two) times daily. 06/28/17  Yes Skeet Latch, MD  Multiple Vitamin (MULTIVITAMIN WITH MINERALS) TABS tablet Take 1 tablet by mouth at bedtime.   Yes [provider]  nitroGLYCERIN (NITROSTAT) 0.4 MG SL tablet Place 0.4 mg under the tongue every 5 (five) minutes as needed for chest pain.  Reported on 07/20/2015   Yes [provider]  omeprazole (PRILOSEC) 20 MG capsule Take 20 mg by mouth 2 (two) times daily before a meal.  12/23/11  Yes [provider]  pravastatin (PRAVACHOL) 40 MG tablet Take 40 mg by mouth at bedtime.  10/29/14  Yes [provider]  predniSONE (DELTASONE) 10 MG tablet Take 1 tablet (10 mg total) by mouth daily with breakfast. 04/28/17  Yes Lavina Hamman, MD  senna (SENOKOT) 8.6 MG tablet Take 1 tablet by mouth 2 (two) times daily.    Yes [provider]  vitamin A 8000 UNIT capsule Take 8,000 Units by mouth 2 (two) times daily.   Yes [provider]  vitamin C (ASCORBIC ACID) 500 MG tablet Take 500 mg by mouth every morning.   Yes [provider]  ZETIA 10 MG tablet Take 5 mg by mouth at bedtime.  10/15/11  Yes [provider]  zinc sulfate 220 (50 Zn) MG capsule Take 1 capsule (220 mg total) by mouth every morning. 06/02/17  Yes Cristal Ford, DO    Family History Family History  Problem Relation Age of Onset  . Hypertension Mother   . Cancer Mother   . Kidney failure Father   . Heart disease Father     Social History Social History   Tobacco Use  . Smoking status: Former Research scientist (life sciences)  . Smokeless tobacco: Never Used  Substance Use Topics  . Alcohol use: Yes    Comment: rare  . Drug use: No     Allergies   Demerol [meperidine] and Keppra [levetiracetam]   Review of Systems Review of Systems  Unable to perform ROS: Mental status change     Physical Exam Updated Vital Signs BP (!) 142/67   Pulse 93   Temp 98.7 F (37.1 C) (Oral)   Resp 14   SpO2 (!) 85%   Physical Exam  Constitutional: He appears well-developed.  Elderly, frail  HENT:  Head: Normocephalic and atraumatic.  Right Ear: External ear normal.  Left Ear: External ear normal.  Eyes: Pupils are equal, round, and reactive to light. Conjunctivae and EOM are normal.  Neck: Normal range of motion and phonation  normal. Neck supple.  Cardiovascular: Normal rate, regular rhythm and normal heart sounds.  Pulmonary/Chest: Effort normal and breath sounds normal. He exhibits no bony tenderness.  Abdominal: Soft. He exhibits no distension. There is no  tenderness.  Musculoskeletal: Normal range of motion. He exhibits edema (3+ bilateral lower legs.).  Unna boot type dressing on right lower leg.  Neurological: He is alert. No cranial nerve deficit. He exhibits normal muscle tone. Coordination normal.  He is alert enough to follow some simple commands.  He is not conversant.  Skin: Skin is warm, dry and intact.  Psychiatric:  Somewhat lethargic  Nursing note and vitals reviewed.    ED Treatments / Results  Labs (all labs ordered are listed, but only abnormal results are displayed) Labs Reviewed  BASIC METABOLIC PANEL - Abnormal; Notable for the following components:      Result Value   BUN 30 (*)    Creatinine, Ser 1.48 (*)    Calcium 8.2 (*)    GFR calc non Af Amer 43 (*)    GFR calc Af Amer 49 (*)    All other components within normal limits  CBC WITH DIFFERENTIAL/PLATELET - Abnormal; Notable for the following components:   RBC 3.11 (*)    Hemoglobin 9.4 (*)    HCT 30.0 (*)    Monocytes Absolute 1.2 (*)    All other components within normal limits  URINALYSIS, ROUTINE W REFLEX MICROSCOPIC - Abnormal; Notable for the following components:   Ketones, ur 5 (*)    All other components within normal limits  BLOOD GAS, VENOUS - Abnormal; Notable for the following components:   pCO2, Ven 41.2 (*)    All other components within normal limits    EKG None  Radiology Dg Chest Port 1 View  Result Date: 07/11/2017 CLINICAL DATA:  Shortness of breath.  Choking. EXAM: PORTABLE CHEST 1 VIEW COMPARISON:  07/01/2017 FINDINGS: Shallow inspiration. Consolidation or atelectasis in the lung bases with small bilateral pleural effusions. Probable increased since previous study although comparison is difficult  due to differences in technique. Cardiac enlargement. Degenerative changes in the spine. IMPRESSION: Consolidation or atelectasis in the lung bases with small bilateral pleural effusions appears to be increasing. Cardiac enlargement. Electronically Signed   By: Lucienne Capers M.D.   On: 07/11/2017 04:05    Procedures Procedures (including critical care time)  Medications Ordered in ED Medications  0.9 %  sodium chloride infusion ( Intravenous Hold 07/11/17 0357)  sodium chloride 0.9 % bolus 500 mL (500 mLs Intravenous New Bag/Given 07/11/17 0356)     Initial Impression / Assessment and Plan / ED Course  I have reviewed the triage vital signs and the nursing notes.  Pertinent labs & imaging results that were available during my care of the patient were reviewed by me and considered in my medical decision making (see chart for details).  Clinical Course as of Jul 11 512  Thu Jul 11, 2017  0448 Normal except BUN high 30, creatinine high 1.5, calcium low 8.2, GFR low 43  Basic metabolic panel(!) [EW]  8299 Normal  Urinalysis, Routine w reflex microscopic(!) [EW]  0449 Normal except hemoglobin low 9.4  CBC with Differential(!) [EW]  0449 Normal except PCO2 41 low.  Blood gas, venous(!) [EW]  0456 Bilateral lower lung consolidations, nonspecific appearance, with bilateral small pleural effusions.  Images reviewed by me, worse than prior chest x-ray 1 week ago.  DG Chest Port 1 View [EW]    Clinical Course User Index [EW] Daleen Bo, MD     Patient Vitals for the past 24 hrs:  BP Temp Temp src Pulse Resp SpO2  07/11/17 0400 (!) 142/67 - - 93 14 (!) 85 %  07/11/17 0300 137/71 - - 76 15 95 %  07/11/17 0200 127/65 - - 85 17 96 %  07/11/17 0115 - - - - - 94 %  07/11/17 0114 130/66 98.7 F (37.1 C) Oral 79 14 -      Medical decision making-nonspecific weakness and altered mental status, with ongoing intermittent choking spells.  Chest x-ray nonspecific, but worsening per  radiologist report.  Apparent new bilateral pleural effusions.  Room air oxygenation initially normal, decreased to 85% at 0400 hours  Nursing Notes Reviewed/ Care Coordinated Applicable Imaging Reviewed Interpretation of Laboratory Data incorporated into ED treatment  4:58 AM-Consult complete with hospitalist. Patient case explained and discussed.  He agrees to admit patient for further evaluation and treatment. Call ended at 5:21 AM  Plan: Admit for observation.  Final Clinical Impressions(s) / ED Diagnoses   Final diagnoses:  Weakness  Pleural effusion  Hypoxia  Choking episode    ED Discharge Orders    None       Daleen Bo, MD 07/11/17 Delrae Rend

## 2017-07-11 NOTE — H&P (Signed)
History and Physical    SOCORRO EBRON NOM:[id number] DOB: 11-15-1933 DOA: 07/11/2017  PCP: [name], MD  Patient coming from: Home  I have personally briefly reviewed patient's old medical records in [place]  Chief Complaint: Lethargy  HPI: [name] is a 84 y.o. male with medical history significant of coronary artery disease, atrial fibrillation, seizure disorder, dementia, presented to the emergency department with lethargy.  Patient has been having recurrent aspiration pneumonias for past couple of months.  Most recently admitted to our service just this past week from 4/22 to 4/26 for the 3rd time in 3 months (today will be 4th admit).  First 2 times he was septic.  3rd time he was altered but not septic.  Imaging studies showed PNA, he was admitted, put on unasyn.  That admit was complicated by AKI.  Initially thought to be dehydrated they put him on fluids.  AKI began to improve but he began to become progressively volume overloaded and hypernatremic.  Ultimately Dr. Posey Pronto put him on half NS and continued home PO lasix, this actually worked.  Patients mental status improved and he was discharged.  (for a full recounting of the admit, see Dr. Serita Grit discharge summary on 4/26 which I have reviewed).  Since discharge he has progressively become more lethargic and generally weak at home.  Slow decline on Wed, and has been unable to assist with his own care over past couple of days.  Had possible aspiration event last night as well per wife.   ED Course: AKI with creat 1.5, BUN 30.  CXR looks maybe slightly worse than 4/22 with BLL opacities and small B pleural effusions.  A.Febrile, WBC nl.  Patient given 500cc NS bolus.  Hospitalist asked to admit.   Review of Systems: As per HPI otherwise 10 point review of systems negative.   Past Medical History:  Diagnosis Date  . Abnormality of gait 09/22/2015  . Arthritis   . Atrial fibrillation ([place])   . CAD (coronary  artery disease)    Stent to RCA, Penta stent, 99% reduced to 0% 2002.  . Cancer ([place])    skin CA removed from back  . Chronic insomnia 03/30/2015  . Complication of anesthesia    trouble waking up  . Constipation    from medications taken  . GERD (gastroesophageal reflux disease)   . Hypercholesteremia   . Hypertension   . Hypothyroidism   . Memory difficulty 09/22/2015  . Osteoarthritis   . Seizures ([place])   . Sepsis ([place]) 05/2017  . Transient alteration of awareness 03/30/2015  . Vertigo    hx of    Past Surgical History:  Procedure Laterality Date  . BACK SURGERY    . EYE SURGERY     Bilateral Cataract surgery   . HERNIA REPAIR    . I&D EXTREMITY Right 05/10/2015   Procedure: IRRIGATION AND DEBRIDEMENT EXTREMITY;  Surgeon: Leanora Cover, MD;  Location: [facility];  Service: Orthopedics;  Laterality: Right;  . KNEE ARTHROPLASTY     right knee X 2; left knee once  . LAMINECTOMY     X 6  . POSTERIOR CERVICAL FUSION/FORAMINOTOMY  01/28/2012   Procedure: POSTERIOR CERVICAL FUSION/FORAMINOTOMY LEVEL 3;  Surgeon: [name], MD;  Location: [facility];  Service: Neurosurgery;  Laterality: Left;  Posterior Cervical Five-Thoracic One Fusion, Arthrodesis with LEFT Cervical Seven-thoracic One Laminectomy, Foraminotomy and Resection of Synovial Cyst  . POSTERIOR CERVICAL FUSION/FORAMINOTOMY N/A 01/29/2013  Procedure: POSTERIOR CERVICAL FUSION/FORAMINOTOMY LEVEL 1 and C2-5 Posteriolateral Arthrodesis;  Surgeon: [name], MD;  Location: [facility];  Service: Neurosurgery;  Laterality: N/A;  C2-C3 Laminectomy C2-C3 posterior cervical arthrodesis  . TONSILLECTOMY       reports that he has quit smoking. He has never used smokeless tobacco. He reports that he drinks alcohol. He reports that he does not use drugs.  Allergies  Allergen Reactions  . Demerol [Meperidine] Nausea And Vomiting  . Keppra [Levetiracetam] Other (See Comments)    Causes sleepiness     Family History  Problem Relation Age of Onset  . Hypertension Mother   . Cancer Mother   . Kidney failure Father   . Heart disease Father      Prior to Admission medications   Medication Sig Start Date End Date Taking? Authorizing Provider  acetaminophen (TYLENOL) 325 MG tablet Take 650 mg by mouth every 4 (four) hours as needed for mild pain.   Yes [provider]  alendronate (FOSAMAX) 70 MG tablet Take 70 mg by mouth once a week.  01/06/12  Yes [provider]  Amino Acids-Protein Hydrolys (FEEDING SUPPLEMENT, PRO-STAT SUGAR FREE 64,) LIQD Take 30 mLs by mouth 2 (two) times daily. 07/05/17  Yes Lavina Hamman, MD  apixaban (ELIQUIS) 5 MG TABS tablet Take 1 tablet (5 mg total) by mouth 2 (two) times daily. 06/28/17  Yes Skeet Latch, MD  calcium carbonate (OS-CAL - DOSED IN MG OF ELEMENTAL CALCIUM) 1250 (500 Ca) MG tablet Take 1 tablet (1,250 mg total) by mouth 2 (two) times daily with a meal. 06/01/17  Yes Mikhail, Velta Addison, DO  Cholecalciferol (VITAMIN D-3) 1000 units CAPS Take 1,000 Units by mouth 2 (two) times daily.   Yes [provider]  Coenzyme Q10 (COQ10) 200 MG CAPS Take 200 mg by mouth at bedtime.   Yes [provider]  collagenase (SANTYL) ointment Apply topically daily. 06/02/17  Yes Mikhail, Maryann, DO  cyanocobalamin 500 MCG tablet Take 500 mcg by mouth at bedtime.   Yes [provider]  diltiazem (CARDIZEM CD) 300 MG 24 hr capsule Take 1 capsule (300 mg total) by mouth daily. 06/28/17  Yes Skeet Latch, MD  divalproex (DEPAKOTE) 250 MG DR tablet Take 3 tablets (750 mg total) by mouth every 12 (twelve) hours. Patient taking differently: Take 250-750 mg by mouth 2 (two) times daily. Take 1 tablet (250 mg) in the morning and Take 3 tablets (750 mg) in the evening. 07/09/15  Yes Robbie Lis, MD  docusate sodium (COLACE) 100 MG capsule Take 200 mg by mouth 2 (two) times daily.    Yes [provider]  doxazosin  (CARDURA) 4 MG tablet Take 1 tablet (4 mg total) by mouth daily. 06/28/17  Yes Skeet Latch, MD  feeding supplement, ENSURE ENLIVE, (ENSURE ENLIVE) LIQD Take 237 mLs by mouth 3 (three) times daily between meals. 06/01/17  Yes Mikhail, [place], DO  ferrous sulfate 325 (65 FE) MG tablet Take 1 tablet (325 mg total) by mouth 2 (two) times daily with a meal. 06/28/17  Yes Skeet Latch, MD  furosemide (LASIX) 20 MG tablet Take 1 tablet (20 mg total) by mouth daily. 07/05/17  Yes Lavina Hamman, MD  gabapentin (NEURONTIN) 300 MG capsule Take 300 mg by mouth 2 (two) times daily.   Yes [provider]  HYDROcodone-acetaminophen (NORCO) 10-325 MG tablet Take 1 tablet by mouth every 6 (six) hours as needed for moderate pain. 04/25/17  Yes Posey Pronto,  Josetta Huddle, MD  levothyroxine (SYNTHROID, LEVOTHROID) 150 MCG tablet Take 150 mcg by mouth daily before breakfast. 12/27/11  Yes [provider]  lidocaine (LIDODERM) 5 % Place 1 patch onto the skin daily as needed. Remove & Discard patch within 12 hours or as directed by MD 06/01/17  Yes Mikhail, Velta Addison, DO  metoprolol tartrate (LOPRESSOR) 25 MG tablet Take 1 tablet (25 mg total) by mouth 2 (two) times daily. 06/28/17  Yes Skeet Latch, MD  Multiple Vitamin (MULTIVITAMIN WITH MINERALS) TABS tablet Take 1 tablet by mouth at bedtime.   Yes [provider]  nitroGLYCERIN (NITROSTAT) 0.4 MG SL tablet Place 0.4 mg under the tongue every 5 (five) minutes as needed for chest pain. Reported on 07/20/2015   Yes [provider]  omeprazole (PRILOSEC) 20 MG capsule Take 20 mg by mouth 2 (two) times daily before a meal.  12/23/11  Yes [provider]  pravastatin (PRAVACHOL) 40 MG tablet Take 40 mg by mouth at bedtime.  10/29/14  Yes [provider]  predniSONE (DELTASONE) 10 MG tablet Take 1 tablet (10 mg total) by mouth daily with breakfast. 04/28/17  Yes Lavina Hamman, MD  senna (SENOKOT) 8.6 MG tablet Take 1 tablet by  mouth 2 (two) times daily.    Yes [provider]  vitamin A 8000 UNIT capsule Take 8,000 Units by mouth 2 (two) times daily.   Yes [provider]  vitamin C (ASCORBIC ACID) 500 MG tablet Take 500 mg by mouth every morning.   Yes [provider]  ZETIA 10 MG tablet Take 5 mg by mouth at bedtime.  10/15/11  Yes [provider]  zinc sulfate 220 (50 Zn) MG capsule Take 1 capsule (220 mg total) by mouth every morning. 06/02/17  Yes Cristal Ford, DO    Physical Exam: Vitals:   07/11/17 0300 07/11/17 0400 07/11/17 0500 07/11/17 0600  BP: 137/71 (!) 142/67 138/65 (!) 148/68  Pulse: 76 93 76 (!) 107  Resp: 15 14 14 15   Temp:      TempSrc:      SpO2: 95% (!) 89% 90% 90%    Constitutional: NAD, calm, comfortable, sleeping Eyes: PERRL, lids and conjunctivae normal ENMT: Mucous membranes are moist. Posterior pharynx clear of any exudate or lesions.Normal dentition.  Neck: normal, supple, no masses, no thyromegaly Respiratory: Few crackles bilateral bases Cardiovascular: Irr, Irr, 3+ BLE pitting edema Abdomen: no tenderness, no masses palpated. No hepatosplenomegaly. Bowel sounds positive.  Musculoskeletal: no clubbing / cyanosis. No joint deformity upper and lower extremities. Good ROM, no contractures. Normal muscle tone.  Skin: no rashes, lesions, ulcers. No induration Neurologic: CN 2-12 grossly intact. Sensation intact, DTR normal. Strength 5/5 in all 4.  Psychiatric: Lethargic, does wake up and answer a question or two before falling back asleep   Labs on Admission: I have personally reviewed following labs and imaging studies  CBC: Recent Labs  Lab 07/05/17 0544 07/11/17 0346  WBC 4.4 7.6  NEUTROABS  --  5.5  HGB 9.5* 9.4*  HCT 30.1* 30.0*  MCV 97.7 96.5  PLT 125* 174*   Basic Metabolic Panel: Recent Labs  Lab 07/05/17 0544 07/11/17 0346  NA 143 138  K 3.8 3.8  CL 108 103  CO2 23 23  GLUCOSE 83 87  BUN 31* 30*  CREATININE 1.24  1.48*  CALCIUM 8.5* 8.2*   GFR: Estimated Creatinine Clearance: 40.4 mL/min (A) (by C-G formula based on SCr of 1.48 mg/dL (H)). Liver Function  Tests: No results for input(s): AST, ALT, ALKPHOS, BILITOT, PROT, ALBUMIN in the last 168 hours. No results for input(s): LIPASE, AMYLASE in the last 168 hours. No results for input(s): AMMONIA in the last 168 hours. Coagulation Profile: No results for input(s): INR, PROTIME in the last 168 hours. Cardiac Enzymes: No results for input(s): CKTOTAL, CKMB, CKMBINDEX, TROPONINI in the last 168 hours. BNP (last 3 results) No results for input(s): PROBNP in the last 8760 hours. HbA1C: No results for input(s): HGBA1C in the last 72 hours. CBG: No results for input(s): GLUCAP in the last 168 hours. Lipid Profile: No results for input(s): CHOL, HDL, LDLCALC, TRIG, CHOLHDL, LDLDIRECT in the last 72 hours. Thyroid Function Tests: No results for input(s): TSH, T4TOTAL, FREET4, T3FREE, THYROIDAB in the last 72 hours. Anemia Panel: No results for input(s): VITAMINB12, FOLATE, FERRITIN, TIBC, IRON, RETICCTPCT in the last 72 hours. Urine analysis:    Component Value Date/Time   COLORURINE YELLOW 07/11/2017 0346   APPEARANCEUR CLEAR 07/11/2017 0346   LABSPEC 1.015 07/11/2017 0346   PHURINE 5.0 07/11/2017 0346   GLUCOSEU NEGATIVE 07/11/2017 0346   HGBUR NEGATIVE 07/11/2017 0346   BILIRUBINUR NEGATIVE 07/11/2017 0346   KETONESUR 5 (A) 07/11/2017 0346   PROTEINUR NEGATIVE 07/11/2017 0346   UROBILINOGEN 1.0 09/16/2007 1212   NITRITE NEGATIVE 07/11/2017 0346   LEUKOCYTESUR NEGATIVE 07/11/2017 0346    Radiological Exams on Admission: Dg Chest Port 1 View  Result Date: 07/11/2017 CLINICAL DATA:  Shortness of breath.  Choking. EXAM: PORTABLE CHEST 1 VIEW COMPARISON:  07/01/2017 FINDINGS: Shallow inspiration. Consolidation or atelectasis in the lung bases with small bilateral pleural effusions. Probable increased since previous study although comparison is  difficult due to differences in technique. Cardiac enlargement. Degenerative changes in the spine. IMPRESSION: Consolidation or atelectasis in the lung bases with small bilateral pleural effusions appears to be increasing. Cardiac enlargement. Electronically Signed   By: Lucienne Capers M.D.   On: 07/11/2017 04:05    EKG: Independently reviewed.  Assessment/Plan Principal Problem:   Acute metabolic encephalopathy Active Problems:   Essential hypertension   Seizure disorder (HCC)   Acute kidney injury ([place])   CKD (chronic kidney disease) stage 3, GFR 30-59 ml/min (HCC)   Acute encephalopathy   Chronic diastolic CHF (congestive heart failure) (HCC)   PAF (paroxysmal atrial fibrillation) (HCC)   Aspiration syndrome, subsequent encounter    1. Acute metabolic encephalopathy - 1. Due either to suspected aspiration pneumonia and/or AKI 2. Suspect AKI may be playing a big role here with decreased clearance of numerous medications. 2. Questionable aspiration pneumonia - 1. Afebrile, no WBC, but didn't have either of these during last admit either. 2. CXR looks to be slightly worse vs the same as last admit. 3. No fever, no WBC, and pro-calcitonin is negative. 4. Will hold off on starting ABx for now. 5. Did have possible aspiration last night with coughing with dinner. 6. Keep close eye on him for development of fever, WBC, etc during admit. 3. AKI - also chronic diastolic CHF, and volume status discussion in general: 1. As Dr. Posey Pronto and Dr. Sarajane Jews noted last admit: determining volume status in this patient is difficult at best. 2. AKI due to poor PO intake, though does have peripheral edema. 3. BNP slightly lower than admit last time 4. Will hold lasix and very gently hydrate with 50cc/hr of half NS 5. Repeat BMP tomorrow AM 6. Note that they seemed to be going back and forth on a daily basis between  lasix and IVF last admit. 7. Strict intake and output 4. Seizure disorder  - 1. Continue depakote for now 2. Level ordered and pending 5. PAF - 1. Continue Cardizem and toprol 2. Continue eliquis 3. Tele monitor for first 24h 6. Chronic RLE wound - 1. Wound care consult 7. Mild dementia at baseline 8. Chronic steroid use - cont prednisone  DVT prophylaxis: Eliquis Code Status: DNR Family Communication: Wife at bedside Disposition Plan: TBD Consults called: None Admission status: Admit to inpatient   [name] DO [facility] Pager [phone]  If 7AM-7PM, please contact day team taking care of patient [email or web address] Password [facility]  07/11/2017, 6:21 AM

## 2017-07-11 NOTE — Progress Notes (Signed)
PROGRESS NOTE  BRANDLEY ALDRETE CNO:[id number] DOB: 26-Sep-1933 DOA: 07/11/2017 PCP: [name], MD  HPI/Recap of past 24 hours: [name] is a 84 y.o. male with medical history significant for CAD, atrial fibrillation, seizure disorder, dementia, presented to the ED with lethargy. Pt with multiple admission for sepsis 2/2 recurrent aspiration pneumonia, about 3 times in the past couple of months, last admission from 4/22-4/26. Since discharge he has progressively become more lethargic and generally weak at home, unable to assist with his own care over past couple of days. Wife reported possible aspiration event PTA. In the ED, pt noted to have AKI, creat 1.5, BUN 30.  CXR looks maybe slightly worse than 4/22 with BLL opacities and small B pleural effusions. Pt admitted for further management.  Today, pt noted to still be lethargic, sleepy, easily arousable. Denies any new complaints. Wife at bedside.    Assessment/Plan: Principal Problem:   Acute metabolic encephalopathy Active Problems:   Essential hypertension   Seizure disorder (HCC)   Acute kidney injury ([place])   CKD (chronic kidney disease) stage 3, GFR 30-59 ml/min (HCC)   Acute encephalopathy   Chronic diastolic CHF (congestive heart failure) (HCC)   PAF (paroxysmal atrial fibrillation) (HCC)   Aspiration syndrome, subsequent encounter  Acute metabolic encephalopathy ?? Aspiration PNA vs ??sepsis Afebrile, no leukocytosis Procalcitonin <0.10 BC X 2 pending CXR showed: Possible consolidation or atelectasis in the lung bases with small bilateral pleural effusions appears to be increasing. Cardiac enlargement UA negative, UC pending collection Will monitor closely, hold off antibiotics for now  AKI ? Poor oral intake, though with BLE edema (at baseline) Determining vol status quite tricky in this patient Agree with continuing 50cc/hr of IVF and reassess in am Hold lasix for now Strict I &O Daily BMP  Iron def  anemia Hgb at baseline Continue PO iron supplement  Dysphagia SLP consulted Continue dysphagia 3 diet Aspiration precautions  Seizure disorder Continue depakote, levels WNL  PAF Continue Cardizem and toprol Continue eliquis  Hypothyroidism TSH, free t4 pending Continue synthroid  Chronic RLE wound Wound care consult  Mild dementia at baseline  Chronic steroid use Cont prednisone      Code Status: DNR   Family Communication: Wife at bedside  Disposition Plan:  To be determined   Consultants:  None   Procedures:  None  Antimicrobials:  None  DVT prophylaxis:  Apixiban   Objective: Vitals:   07/11/17 0400 07/11/17 0500 07/11/17 0600 07/11/17 0807  BP: (!) 142/67 138/65 (!) 148/68 (!) 160/78  Pulse: 93 76 (!) 107 (!) 103  Resp: 14 14 15 16   Temp:    98.6 F (37 C)  TempSrc:    Oral  SpO2: (!) 89% 90% 90% 95%    Intake/Output Summary (Last 24 hours) at 07/11/2017 1555 Last data filed at 07/11/2017 0457 Gross per 24 hour  Intake 500 ml  Output -  Net 500 ml   There were no vitals filed for this visit.  Exam:   General:  lethargic, sleepy, easily arousable  Cardiovascular: S1, S2 irregular   Respiratory: Mild crackles heard at the b/l bases  Abdomen: soft, NT, ND, BS present  Musculoskeletal: 2+ BLE pitting edema  Skin: Wound on RLE with ace wrap  Psychiatry: Unable to assess    Data Reviewed: CBC: Recent Labs  Lab 07/05/17 0544 07/11/17 0346  WBC 4.4 7.6  NEUTROABS  --  5.5  HGB 9.5* 9.4*  HCT 30.1* 30.0*  MCV 97.7  96.5  PLT 125* 119*   Basic Metabolic Panel: Recent Labs  Lab 07/05/17 0544 07/11/17 0346 07/11/17 0831  NA 143 138  --   K 3.8 3.8  --   CL 108 103  --   CO2 23 23  --   GLUCOSE 83 87  --   BUN 31* 30*  --   CREATININE 1.24 1.48*  --   CALCIUM 8.5* 8.2*  --   MG  --   --  1.7   GFR: Estimated Creatinine Clearance: 40.4 mL/min (A) (by C-G formula based on SCr of 1.48 mg/dL (H)). Liver  Function Tests: Recent Labs  Lab 07/11/17 0831  AST 16  ALT 14*  ALKPHOS 45  BILITOT 0.9  PROT 5.5*  ALBUMIN 3.1*   No results for input(s): LIPASE, AMYLASE in the last 168 hours. No results for input(s): AMMONIA in the last 168 hours. Coagulation Profile: No results for input(s): INR, PROTIME in the last 168 hours. Cardiac Enzymes: No results for input(s): CKTOTAL, CKMB, CKMBINDEX, TROPONINI in the last 168 hours. BNP (last 3 results) No results for input(s): PROBNP in the last 8760 hours. HbA1C: No results for input(s): HGBA1C in the last 72 hours. CBG: No results for input(s): GLUCAP in the last 168 hours. Lipid Profile: No results for input(s): CHOL, HDL, LDLCALC, TRIG, CHOLHDL, LDLDIRECT in the last 72 hours. Thyroid Function Tests: No results for input(s): TSH, T4TOTAL, FREET4, T3FREE, THYROIDAB in the last 72 hours. Anemia Panel: No results for input(s): VITAMINB12, FOLATE, FERRITIN, TIBC, IRON, RETICCTPCT in the last 72 hours. Urine analysis:    Component Value Date/Time   COLORURINE YELLOW 07/11/2017 0346   APPEARANCEUR CLEAR 07/11/2017 0346   LABSPEC 1.015 07/11/2017 0346   PHURINE 5.0 07/11/2017 0346   GLUCOSEU NEGATIVE 07/11/2017 0346   HGBUR NEGATIVE 07/11/2017 0346   BILIRUBINUR NEGATIVE 07/11/2017 0346   KETONESUR 5 (A) 07/11/2017 0346   PROTEINUR NEGATIVE 07/11/2017 0346   UROBILINOGEN 1.0 09/16/2007 1212   NITRITE NEGATIVE 07/11/2017 0346   LEUKOCYTESUR NEGATIVE 07/11/2017 0346   Sepsis Labs: @LABRCNTIP (procalcitonin:4,lacticidven:4)  ) Recent Results (from the past 240 hour(s))  MRSA PCR Screening     Status: None   Collection Time: 07/02/17 12:05 AM  Result Value Ref Range Status   MRSA by PCR NEGATIVE NEGATIVE Final    Comment:        The GeneXpert MRSA Assay (FDA approved for NASAL specimens only), is one component of a comprehensive MRSA colonization surveillance program. It is not intended to diagnose MRSA infection nor to guide  or monitor treatment for MRSA infections. Performed at [facility], [place] [address], [place], [place] [address]       Studies: Dg Chest Port 1 View  Result Date: 07/11/2017 CLINICAL DATA:  Shortness of breath.  Choking. EXAM: PORTABLE CHEST 1 VIEW COMPARISON:  07/01/2017 FINDINGS: Shallow inspiration. Consolidation or atelectasis in the lung bases with small bilateral pleural effusions. Probable increased since previous study although comparison is difficult due to differences in technique. Cardiac enlargement. Degenerative changes in the spine. IMPRESSION: Consolidation or atelectasis in the lung bases with small bilateral pleural effusions appears to be increasing. Cardiac enlargement. Electronically Signed   By: Lucienne Capers M.D.   On: 07/11/2017 04:05    Scheduled Meds: . apixaban  5 mg Oral BID  . calcium carbonate  1,250 mg Oral BID WC  . cholecalciferol  1,000 Units Oral BID  . collagenase   Topical Daily  . diltiazem  300 mg Oral Daily  . divalproex  250 mg Oral Daily  . divalproex  750 mg Oral QHS  . docusate sodium  200 mg Oral BID  . doxazosin  4 mg Oral Daily  . ezetimibe  5 mg Oral QHS  . feeding supplement (ENSURE ENLIVE)  237 mL Oral TID BM  . feeding supplement (PRO-STAT SUGAR FREE 64)  30 mL Oral BID  . ferrous sulfate  325 mg Oral BID WC  . gabapentin  300 mg Oral BID  . levothyroxine  150 mcg Oral QAC breakfast  . metoprolol tartrate  25 mg Oral BID  . multivitamin with minerals  1 tablet Oral QHS  . pantoprazole  40 mg Oral Daily  . pravastatin  40 mg Oral QHS  . predniSONE  10 mg Oral Q breakfast  . senna  1 tablet Oral BID  . vitamin A  10,000 Units Oral Daily  . vitamin B-12  500 mcg Oral QHS  . vitamin C  500 mg Oral q morning - 10a  . zinc sulfate  220 mg Oral q morning - 10a    Continuous Infusions: . sodium chloride 50 mL/hr at 07/11/17 0637     LOS: 0 days     Alma Friendly, MD [facility]   If  7PM-7AM, please contact night-coverage [email or web address] Password [facility] 07/11/2017, 3:55 PM

## 2017-07-12 ENCOUNTER — Inpatient Hospital Stay (HOSPITAL_COMMUNITY): Payer: Medicare Other

## 2017-07-12 LAB — BASIC METABOLIC PANEL
Anion gap: 10 (ref 5–15)
BUN: 29 mg/dL — ABNORMAL HIGH (ref 6–20)
CO2: 23 mmol/L (ref 22–32)
Calcium: 7.8 mg/dL — ABNORMAL LOW (ref 8.9–10.3)
Chloride: 106 mmol/L (ref 101–111)
Creatinine, Ser: 1.56 mg/dL — ABNORMAL HIGH (ref 0.61–1.24)
GFR calc Af Amer: 46 mL/min — ABNORMAL LOW (ref >60)
GFR calc non Af Amer: 40 mL/min — ABNORMAL LOW (ref >60)
Glucose, Bld: 86 mg/dL (ref 65–99)
Potassium: 4 mmol/L (ref 3.5–5.1)
Sodium: 139 mmol/L (ref 135–145)

## 2017-07-12 LAB — CBC WITH DIFFERENTIAL/PLATELET
Basophils Absolute: 0 10*3/uL (ref 0.0–0.1)
Basophils Relative: 0 %
Eosinophils Absolute: 0 10*3/uL (ref 0.0–0.7)
Eosinophils Relative: 0 %
HCT: 29.2 % — ABNORMAL LOW (ref 39.0–52.0)
Hemoglobin: 9.2 g/dL — ABNORMAL LOW (ref 13.0–17.0)
Lymphocytes Relative: 10 %
Lymphs Abs: 0.7 10*3/uL (ref 0.7–4.0)
MCH: 30.7 pg (ref 26.0–34.0)
MCHC: 31.5 g/dL (ref 30.0–36.0)
MCV: 97.3 fL (ref 78.0–100.0)
Monocytes Absolute: 1.2 10*3/uL — ABNORMAL HIGH (ref 0.1–1.0)
Monocytes Relative: 17 %
Neutro Abs: 5.2 10*3/uL (ref 1.7–7.7)
Neutrophils Relative %: 73 %
Platelets: 103 10*3/uL — ABNORMAL LOW (ref 150–400)
RBC: 3 MIL/uL — ABNORMAL LOW (ref 4.22–5.81)
RDW: 15.3 % (ref 11.5–15.5)
WBC: 7.1 10*3/uL (ref 4.0–10.5)

## 2017-07-12 LAB — T4, FREE: Free T4: 1.11 ng/dL (ref 0.82–1.77)

## 2017-07-12 LAB — LIPID PANEL
Cholesterol: 82 mg/dL (ref 0–200)
HDL: 46 mg/dL (ref >40)
LDL Cholesterol: 27 mg/dL (ref 0–99)
Total CHOL/HDL Ratio: 1.8 RATIO
Triglycerides: 45 mg/dL (ref <150)
VLDL: 9 mg/dL (ref 0–40)

## 2017-07-12 LAB — HEMOGLOBIN A1C
Hgb A1c MFr Bld: 5 % (ref 4.8–5.6)
Mean Plasma Glucose: 96.8 mg/dL

## 2017-07-12 LAB — LACTIC ACID, PLASMA: Lactic Acid, Venous: 1 mmol/L (ref 0.5–1.9)

## 2017-07-12 LAB — URINE CULTURE

## 2017-07-12 LAB — TSH: TSH: 0.452 u[IU]/mL (ref 0.350–4.500)

## 2017-07-12 MED ORDER — LIDOCAINE 5 % EX PTCH
1.0000 | MEDICATED_PATCH | CUTANEOUS | Status: DC
  Administered 2017-07-12 – 2017-07-18 (×7): 1 via TRANSDERMAL
  Filled 2017-07-12 (×8): qty 1

## 2017-07-12 MED ORDER — RESOURCE THICKENUP CLEAR PO POWD
ORAL | Status: DC | PRN
  Filled 2017-07-12: qty 125

## 2017-07-12 MED ORDER — BOOST / RESOURCE BREEZE PO LIQD CUSTOM
1.0000 | Freq: Three times a day (TID) | ORAL | Status: DC
Start: 2017-07-12 — End: 2017-07-19
  Administered 2017-07-13 – 2017-07-19 (×6): 1 via ORAL

## 2017-07-12 MED ORDER — APIXABAN 2.5 MG PO TABS
2.5000 mg | ORAL_TABLET | Freq: Two times a day (BID) | ORAL | Status: DC
  Administered 2017-07-12 – 2017-07-13 (×3): 2.5 mg via ORAL
  Filled 2017-07-12 (×3): qty 1

## 2017-07-12 NOTE — Evaluation (Signed)
Clinical/Bedside Swallow Evaluation Patient Details  Name: [name] MRN: [id number] Date of Birth: May 21, 1933  Today's Date: 07/12/2017 Time: SLP Start Time (ACUTE ONLY): 8546 SLP Stop Time (ACUTE ONLY): 1755 SLP Time Calculation (min) (ACUTE ONLY): 50 min  Past Medical History:  Past Medical History:  Diagnosis Date  . Abnormality of gait 09/22/2015  . Arthritis   . Atrial fibrillation ([place])   . CAD (coronary artery disease)    Stent to RCA, Penta stent, 99% reduced to 0% 2002.  . Cancer ([place])    skin CA removed from back  . Chronic insomnia 03/30/2015  . Complication of anesthesia    trouble waking up  . Constipation    from medications taken  . GERD (gastroesophageal reflux disease)   . Hypercholesteremia   . Hypertension   . Hypothyroidism   . Memory difficulty 09/22/2015  . Osteoarthritis   . Seizures ([place])   . Sepsis ([place]) 05/2017  . Transient alteration of awareness 03/30/2015  . Vertigo    hx of   Past Surgical History:  Past Surgical History:  Procedure Laterality Date  . BACK SURGERY    . EYE SURGERY     Bilateral Cataract surgery   . HERNIA REPAIR    . I&D EXTREMITY Right 05/10/2015   Procedure: IRRIGATION AND DEBRIDEMENT EXTREMITY;  Surgeon: Leanora Cover, MD;  Location: [facility];  Service: Orthopedics;  Laterality: Right;  . KNEE ARTHROPLASTY     right knee X 2; left knee once  . LAMINECTOMY     X 6  . POSTERIOR CERVICAL FUSION/FORAMINOTOMY  01/28/2012   Procedure: POSTERIOR CERVICAL FUSION/FORAMINOTOMY LEVEL 3;  Surgeon: [name], MD;  Location: [facility];  Service: Neurosurgery;  Laterality: Left;  Posterior Cervical Five-Thoracic One Fusion, Arthrodesis with LEFT Cervical Seven-thoracic One Laminectomy, Foraminotomy and Resection of Synovial Cyst  . POSTERIOR CERVICAL FUSION/FORAMINOTOMY N/A 01/29/2013   Procedure: POSTERIOR CERVICAL FUSION/FORAMINOTOMY LEVEL 1 and C2-5 Posteriolateral Arthrodesis;  Surgeon: [name], MD;  Location: [facility];  Service: Neurosurgery;  Laterality: N/A;  C2-C3 Laminectomy C2-C3 posterior cervical arthrodesis  . TONSILLECTOMY     HPI:  [name] is a 84 y.o. male readmitted after discharged 7 days ago.  Pt with medical history significant of HTN, HLD, diastolic CHF, atrial fibrillation on chronic anticoagulation, seizures, hypothyroidism, dementia, chronic lower extremity wound and GERD.  Pt recently was at rehab before prior hospital admit.  Pt with recent sepsis secondary to pna vs wound infection. CXR shows Left lung base atelectasis versus infiltrate increased.  Pt also with h/o multiple spine surgeries- posterior C5-T1 2013 and C2-C5 01/2013.  Swallow eval reordered again - MBS completed during prior hospital admit. Pt also has h/o dementia, and spinal surgeries.     Assessment / Plan / Recommendation Clinical Impression  Pt presents with worsening dysphagia compared to prior assessment - due to his lethargy.  Concern for aspiration of thin liquids noted - c/b cough post=swallow.  Suspect pt likely spilled boluses into pharynx.  Delayed swallow noted = but did not test solids due to pt's dysarthria.  Of note, pt's wife reports he has been lethargic over the last few days - concerning for increased asp risk.  Extensive conversation took place with pt's wife re: diet modification and compensation strategies.     Recommend consider another palliative consult given recurrent admits for this pt.   SLP Visit Diagnosis: Dysphagia, oropharyngeal phase (R13.12)    Aspiration Risk  Moderate aspiration risk;Risk for inadequate nutrition/hydration    Diet Recommendation Nectar-thick liquid;Other (Comment)(full liquids - )   Liquid Administration via: Straw;Cup Medication Administration: Whole meds with puree Supervision: Patient able to self feed Compensations: Slow rate;Small sips/bites;Other (Comment)(pt to self feed) Postural Changes: Remain upright for at least  30 minutes after po intake;Seated upright at 90 degrees    Other  Recommendations Oral Care Recommendations: Oral care BID   Follow up Recommendations Home health SLP      Frequency and Duration min 1 x/week  1 week       Prognosis Prognosis for Safe Diet Advancement: Guarded Barriers to Reach Goals: Time post onset      Swallow Study   General Date of Onset: 07/12/17 HPI: Robert [name] is a 84 y.o. male readmitted after discharged 7 days ago.  Pt with medical history significant of HTN, HLD, diastolic CHF, atrial fibrillation on chronic anticoagulation, seizures, hypothyroidism, dementia, chronic lower extremity wound and GERD.  Pt recently was at rehab before prior hospital admit.  Pt with recent sepsis secondary to pna vs wound infection. CXR shows Left lung base atelectasis versus infiltrate increased.  Pt also with h/o multiple spine surgeries- posterior C5-T1 2013 and C2-C5 01/2013.  Swallow eval reordered again - MBS completed during prior hospital admit. Pt also has h/o dementia, and spinal surgeries.   Type of Study: Bedside Swallow Evaluation Diet Prior to this Study: Dysphagia 3 (soft);Thin liquids Temperature Spikes Noted: No Respiratory Status: Nasal cannula(2 liters) History of Recent Intubation: No Behavior/Cognition: Alert;Cooperative Oral Cavity Assessment: Dry Oral Care Completed by SLP: No Oral Cavity - Dentition: Adequate natural dentition Self-Feeding Abilities: Needs assist Patient Positioning: Upright in bed Baseline Vocal Quality: Low vocal intensity Volitional Cough: Weak Volitional Swallow: Unable to elicit    Oral/Motor/Sensory Function Overall Oral Motor/Sensory Function: (pt leans to the left, generalized weakness)   Ice Chips Ice chips: Not tested   Thin Liquid Thin Liquid: Impaired Presentation: Straw Oral Phase Impairments: Poor awareness of bolus;Reduced lingual movement/coordination Oral Phase Functional Implications: Prolonged oral  transit Pharyngeal  Phase Impairments: Cough - Delayed;Cough - Immediate    Nectar Thick Nectar Thick Liquid: Impaired Presentation: Straw Oral Phase Impairments: Reduced lingual movement/coordination Oral phase functional implications: Prolonged oral transit Pharyngeal Phase Impairments: Suspected delayed Swallow   Honey Thick Honey Thick Liquid: Not tested   Puree Puree: Impaired Presentation: Spoon Oral Phase Impairments: Reduced lingual movement/coordination Oral Phase Functional Implications: Prolonged oral transit Pharyngeal Phase Impairments: Suspected delayed Swallow   Solid   GO   Solid: Not tested Other Comments: due to pt's lethargy        [name] 07/12/2017,6:33 PM  Luanna Salk, [facility] SLP [phone]

## 2017-07-12 NOTE — Progress Notes (Signed)
PROGRESS NOTE  [name] HAL:[id number] DOB: [birth year] DOA: 07/11/2017 PCP: [name], MD  HPI/Recap of past 24 hours: [name] is a 84 y.o. male with medical history significant for CAD, atrial fibrillation, seizure disorder, dementia, presented to the ED with lethargy. Pt with multiple admission for sepsis 2/2 recurrent aspiration pneumonia, about 3 times in the past couple of months, last admission from 4/22-4/26. Since discharge he has progressively become more lethargic and generally weak at home, unable to assist with his own care over past couple of days. Wife reported possible aspiration event PTA. In the ED, pt noted to have AKI, creat 1.5, BUN 30.  CXR looks maybe slightly worse than 4/22 with BLL opacities and small B pleural effusions. Pt admitted for further management.  Today, pt more awake, oriented to self and place. Denies any new complaints. Wife at bedside.   Assessment/Plan: Principal Problem:   Acute metabolic encephalopathy Active Problems:   Essential hypertension   Seizure disorder (HCC)   Acute kidney injury ([place])   CKD (chronic kidney disease) stage 3, GFR 30-59 ml/min (HCC)   Acute encephalopathy   Chronic diastolic CHF (congestive heart failure) (HCC)   PAF (paroxysmal atrial fibrillation) (HCC)   Aspiration syndrome, subsequent encounter  Acute metabolic encephalopathy Improving ?? Aspiration PNA vs ??sepsis Afebrile, no leukocytosis Procalcitonin <0.10, LA WNL BC X 2: NGTD CXR showed: Possible consolidation or atelectasis in the lung bases with small bilateral pleural effusions appears to be increasing. Repeat CXR on 07/12/17 with mild improvement overall, still with some congestion UA negative, UC with multiple species, repeat UC pending recollection Will monitor closely, continue to hold off antibiotics for now  AKI Worsening ? Poor oral intake, though with BLE edema (at baseline) Vs cardiorenal Determining vol status quite tricky  in this patient Agree with continuing 50cc/hr of IVF and reassess in am, if worsening will start lasix Hold lasix for now Strict I &O Daily BMP  Iron def anemia Hgb at baseline Continue PO iron supplement  Dysphagia SLP consulted, rec full liquid, nectar thick, nutritional supplements Aspiration precautions  Seizure disorder Continue depakote, levels WNL  PAF Continue Cardizem and toprol Continue eliquis  Hypothyroidism Continue synthroid  Chronic RLE wound Wound care consult  Mild dementia at baseline  Chronic steroid use Cont prednisone      Code Status: DNR   Family Communication: Wife at bedside  Disposition Plan:  To be determined   Consultants:  None   Procedures:  None  Antimicrobials:  None  DVT prophylaxis:  Apixiban   Objective: Vitals:   07/12/17 0540 07/12/17 1035 07/12/17 1116 07/12/17 1300  BP: 138/63 121/72 120/71 99/62  Pulse: 79 90 90 64  Resp: 19   20  Temp: 98.1 F (36.7 C)   97.8 F (36.6 C)  TempSrc: Oral   Oral  SpO2: 97%   95%   No intake or output data in the 24 hours ending 07/12/17 1847 There were no vitals filed for this visit.  Exam:   General:  Awake, alert  Cardiovascular: S1, S2 irregular   Respiratory: Mild crackles heard at the b/l bases  Abdomen: soft, NT, ND, BS present  Musculoskeletal: 2+ BLE pitting edema, with ace wrap  Skin: Wound on RLE with ace wrap  Psychiatry: Normal    Data Reviewed: CBC: Recent Labs  Lab 07/11/17 0346 07/12/17 0624  WBC 7.6 7.1  NEUTROABS 5.5 5.2  HGB 9.4* 9.2*  HCT 30.0* 29.2*  MCV 96.5 97.3  PLT 115* 741*   Basic Metabolic Panel: Recent Labs  Lab 07/11/17 0346 07/11/17 0831 07/12/17 0624  NA 138  --  139  K 3.8  --  4.0  CL 103  --  106  CO2 23  --  23  GLUCOSE 87  --  86  BUN 30*  --  29*  CREATININE 1.48*  --  1.56*  CALCIUM 8.2*  --  7.8*  MG  --  1.7  --    GFR: Estimated Creatinine Clearance: 38.3 mL/min (A) (by C-G formula  based on SCr of 1.56 mg/dL (H)). Liver Function Tests: Recent Labs  Lab 07/11/17 0831  AST 16  ALT 14*  ALKPHOS 45  BILITOT 0.9  PROT 5.5*  ALBUMIN 3.1*   No results for input(s): LIPASE, AMYLASE in the last 168 hours. No results for input(s): AMMONIA in the last 168 hours. Coagulation Profile: No results for input(s): INR, PROTIME in the last 168 hours. Cardiac Enzymes: No results for input(s): CKTOTAL, CKMB, CKMBINDEX, TROPONINI in the last 168 hours. BNP (last 3 results) No results for input(s): PROBNP in the last 8760 hours. HbA1C: Recent Labs    07/12/17 0624  HGBA1C 5.0   CBG: No results for input(s): GLUCAP in the last 168 hours. Lipid Profile: Recent Labs    07/12/17 0624  CHOL 82  HDL 46  LDLCALC 27  TRIG 45  CHOLHDL 1.8   Thyroid Function Tests: Recent Labs    07/12/17 0624  TSH 0.452  FREET4 1.11   Anemia Panel: No results for input(s): VITAMINB12, FOLATE, FERRITIN, TIBC, IRON, RETICCTPCT in the last 72 hours. Urine analysis:    Component Value Date/Time   COLORURINE YELLOW 07/11/2017 0346   APPEARANCEUR CLEAR 07/11/2017 0346   LABSPEC 1.015 07/11/2017 0346   PHURINE 5.0 07/11/2017 0346   GLUCOSEU NEGATIVE 07/11/2017 0346   HGBUR NEGATIVE 07/11/2017 0346   BILIRUBINUR NEGATIVE 07/11/2017 0346   KETONESUR 5 (A) 07/11/2017 0346   PROTEINUR NEGATIVE 07/11/2017 0346   UROBILINOGEN 1.0 09/16/2007 1212   NITRITE NEGATIVE 07/11/2017 0346   LEUKOCYTESUR NEGATIVE 07/11/2017 0346   Sepsis Labs: @LABRCNTIP (procalcitonin:4,lacticidven:4)  ) Recent Results (from the past 240 hour(s))  Culture, blood (routine x 2) Call MD if unable to obtain prior to antibiotics being given     Status: None (Preliminary result)   Collection Time: 07/11/17  8:31 AM  Result Value Ref Range Status   Specimen Description   Final    BLOOD RIGHT ANTECUBITAL Performed at [facility], [place] [address], [place], [place] [address]    Special Requests    Final    BOTTLES DRAWN AEROBIC ONLY Blood Culture adequate volume Performed at [facility] [address] [place], [place] [address]    Culture   Final    NO GROWTH 1 DAY Performed at [facility], [place] [address] [place], [place] [address]    Report Status PENDING  Incomplete  Culture, blood (routine x 2) Call MD if unable to obtain prior to antibiotics being given     Status: None (Preliminary result)   Collection Time: 07/11/17  8:43 AM  Result Value Ref Range Status   Specimen Description   Final    BLOOD LEFT HAND Performed at [facility] [address] [place], [place] [address]    Special Requests   Final    BOTTLES DRAWN AEROBIC AND ANAEROBIC Blood Culture adequate volume Performed at [facility], [address] [place], [place] [address]    Culture   Final    NO GROWTH 1 DAY Performed at [facility] [address] [place], [place] [address]    Report Status PENDING  Incomplete  Culture, Urine     Status: Abnormal   Collection Time: 07/11/17  4:34 PM  Result Value Ref Range Status   Specimen Description   Final    URINE, RANDOM Performed at [facility] [address], [place], [place] [address]    Culture MULTIPLE SPECIES PRESENT, SUGGEST RECOLLECTION (A)  Final   Report Status 07/12/2017 FINAL  Final      Studies: Dg Chest Port 1 View  Result Date: 07/12/2017 CLINICAL DATA:  Initial evaluation for acute shortness of breath, dyspnea, question aspiration. EXAM: PORTABLE CHEST 1 VIEW COMPARISON:  Prior radiograph from 07/11/2017. FINDINGS: Cardiomegaly, stable from previous. Mediastinal silhouette within normal limits. Lungs are hypoinflated. Diffuse vascular congestion with interstitial prominence consistent with pulmonary interstitial edema, slightly improved from previous. Bilateral pleural effusions with associated bibasilar opacities, which may reflect atelectasis or  infiltrates. Overall appearance is stable to slightly improved from previous with no new focal infiltrates to suggest interval aspiration. No pneumothorax. Osseous structures unchanged. IMPRESSION: Cardiomegaly with diffuse pulmonary interstitial edema and small bilateral pleural effusions associated bibasilar opacities may reflect atelectasis and/or infiltrates. Overall, appearance is stable to slightly improved relative to previous, with no new focal infiltrates to suggest interval aspiration identified. Electronically Signed   By: Jeannine Boga M.D.   On: 07/12/2017 17:15    Scheduled Meds: . apixaban  5 mg Oral BID  . calcium carbonate  1,250 mg Oral BID WC  . cholecalciferol  1,000 Units Oral BID  . collagenase   Topical Daily  . diltiazem  300 mg Oral Daily  . divalproex  250 mg Oral Daily  . divalproex  750 mg Oral QHS  . doxazosin  4 mg Oral Daily  . ezetimibe  5 mg Oral QHS  . feeding supplement  1 Container Oral TID BM  . feeding supplement (ENSURE ENLIVE)  237 mL Oral TID BM  . feeding supplement (PRO-STAT SUGAR FREE 64)  30 mL Oral BID  . ferrous sulfate  325 mg Oral BID WC  . gabapentin  300 mg Oral BID  . levothyroxine  150 mcg Oral QAC breakfast  . lidocaine  1 patch Transdermal Q24H  . metoprolol tartrate  25 mg Oral BID  . pantoprazole  40 mg Oral Daily  . pravastatin  40 mg Oral QHS  . predniSONE  10 mg Oral Q breakfast  . vitamin B-12  500 mcg Oral QHS    Continuous Infusions: . sodium chloride 50 mL/hr at 07/11/17 1505     LOS: 1 day     Alma Friendly, MD [facility]   If 7PM-7AM, please contact night-coverage [email or web address] Password [facility] 07/12/2017, 6:47 PM

## 2017-07-12 NOTE — Care Management Note (Signed)
Case Management Note  Patient Details  Name: [name] MRN: [id number] Date of Birth: 10/26/1933  Subjective/Objective:                  BILATERAL pl. Effusion poss pna  AcDate: Jul 12, 2017 Velva Harman, BSN, [place], [place] Chart and notes review for patient progress and needs. Will follow for case management and discharge needs.[place] for RN,DZ.Management and PT will follow for continuation of services. Next review date: [id number] tion/Plan:   Expected Discharge Date:  (unknown)               Expected Discharge Plan:  [place]  In-House Referral:     Discharge planning Services  CM Consult  Post Acute Care Choice:  Resumption of Svcs/PTA Provider Choice offered to:  Patient  DME Arranged:    DME Agency:     HH Arranged:  RN, PT, Disease Management [place] Agency:  [facility]  Status of Service:  In process, will continue to follow  If discussed at Long Length of Stay Meetings, dates discussed:    Additional Comments:  Leeroy Cha, RN 07/12/2017, 10:06 AM

## 2017-07-13 LAB — CBC WITH DIFFERENTIAL/PLATELET
Basophils Absolute: 0 10*3/uL (ref 0.0–0.1)
Basophils Relative: 0 %
Eosinophils Absolute: 0.1 10*3/uL (ref 0.0–0.7)
Eosinophils Relative: 1 %
HCT: 29.2 % — ABNORMAL LOW (ref 39.0–52.0)
Hemoglobin: 9.3 g/dL — ABNORMAL LOW (ref 13.0–17.0)
Lymphocytes Relative: 8 %
Lymphs Abs: 0.7 10*3/uL (ref 0.7–4.0)
MCH: 30.8 pg (ref 26.0–34.0)
MCHC: 31.8 g/dL (ref 30.0–36.0)
MCV: 96.7 fL (ref 78.0–100.0)
Monocytes Absolute: 0.9 10*3/uL (ref 0.1–1.0)
Monocytes Relative: 12 %
Neutro Abs: 6.3 10*3/uL (ref 1.7–7.7)
Neutrophils Relative %: 79 %
Platelets: 115 10*3/uL — ABNORMAL LOW (ref 150–400)
RBC: 3.02 MIL/uL — ABNORMAL LOW (ref 4.22–5.81)
RDW: 15.1 % (ref 11.5–15.5)
WBC: 7.9 10*3/uL (ref 4.0–10.5)

## 2017-07-13 LAB — BASIC METABOLIC PANEL
Anion gap: 12 (ref 5–15)
BUN: 30 mg/dL — ABNORMAL HIGH (ref 6–20)
CO2: 23 mmol/L (ref 22–32)
Calcium: 7.6 mg/dL — ABNORMAL LOW (ref 8.9–10.3)
Chloride: 103 mmol/L (ref 101–111)
Creatinine, Ser: 1.3 mg/dL — ABNORMAL HIGH (ref 0.61–1.24)
GFR calc Af Amer: 58 mL/min — ABNORMAL LOW (ref >60)
GFR calc non Af Amer: 50 mL/min — ABNORMAL LOW (ref >60)
Glucose, Bld: 89 mg/dL (ref 65–99)
Potassium: 3.8 mmol/L (ref 3.5–5.1)
Sodium: 138 mmol/L (ref 135–145)

## 2017-07-13 MED ORDER — FUROSEMIDE 10 MG/ML IJ SOLN
20.0000 mg | Freq: Once | INTRAMUSCULAR | Status: AC
  Administered 2017-07-13: 20 mg via INTRAVENOUS
  Filled 2017-07-13: qty 2

## 2017-07-13 MED ORDER — IPRATROPIUM-ALBUTEROL 0.5-2.5 (3) MG/3ML IN SOLN
3.0000 mL | Freq: Three times a day (TID) | RESPIRATORY_TRACT | Status: DC
Start: 2017-07-13 — End: 2017-07-13
  Administered 2017-07-13: 3 mL via RESPIRATORY_TRACT
  Filled 2017-07-13: qty 3

## 2017-07-13 MED ORDER — IPRATROPIUM-ALBUTEROL 0.5-2.5 (3) MG/3ML IN SOLN
3.0000 mL | Freq: Two times a day (BID) | RESPIRATORY_TRACT | Status: DC
  Administered 2017-07-13 – 2017-07-14 (×2): 3 mL via RESPIRATORY_TRACT
  Filled 2017-07-13 (×2): qty 3

## 2017-07-13 NOTE — Progress Notes (Signed)
Rales/congestion diminished bases no distress noted. generalized edema. Vital signs stable Wife concerned over respiratory status. IV fluids on hold at 0245 as per On call  Continue to monitior

## 2017-07-13 NOTE — Evaluation (Signed)
Physical Therapy Evaluation Patient Details Name: [name] MRN: [id number] DOB: 1933-05-07 Today's Date: 07/13/2017   History of Present Illness  [name] is a 84 y.o. male with medical history significant for CAD, atrial fibrillation, seizure disorder, dementia, presented to the ED with lethargy. Pt with multiple admission for sepsis 2/2 recurrent aspiration pneumonia, about 3 times in the past couple of months, last admission from 4/22-4/26. Since discharge he has progressively become more lethargic and generally weak at home, unable to assist with his own care over past couple of days.   Clinical Impression  Pt admitted with above diagnosis. Pt currently with functional limitations due to the deficits listed below (see PT Problem List).  Pt will benefit from skilled PT to increase their independence and safety with mobility to allow discharge to the venue listed below.  Pt didn't feel he could even attempt standing today.  He transferred to recliner via lateral scoot transfer with MOD/MAX A of 2.  Pt's wife would really like to take him home with caregiver assistance.  At this time, pt is requiring +2 A level and wouldn't be safe to d/c home with wife unless she has A.  PT feels that SNF is best option at this time.  If they refuse SNF, recommend HHPT with maximizing caregiver services available.     Follow Up Recommendations SNF;Supervision/Assistance - 24 hour    Equipment Recommendations  None recommended by PT    Recommendations for Other Services OT consult     Precautions / Restrictions Precautions Precautions: Fall Restrictions Weight Bearing Restrictions: No      Mobility  Bed Mobility Overal bed mobility: Needs Assistance Bed Mobility: Supine to Sit     Supine to sit: Min assist;Mod assist;+2 for physical assistance     General bed mobility comments: Pt was able to initiate transfer to EOB with MIN A , but needed MOD of 2 to complete.  Transfers Overall  transfer level: Needs assistance   Transfers: Lateral/Scoot Transfers          Lateral/Scoot Transfers: Max assist;Mod assist;+2 physical assistance General transfer comment: Pt declined standing up stating he couldn't do it. Performed lateral scoot transfer to his R to drop arm recliner with use of bed pads.  Ambulation/Gait                Stairs            Wheelchair Mobility    Modified Rankin (Stroke Patients Only)       Balance     Sitting balance-Leahy Scale: Fair       Standing balance-Leahy Scale: Zero Standing balance comment: Pt unable to stand                             Pertinent Vitals/Pain Pain Assessment: No/denies pain    Home Living Family/patient expects to be discharged to:: Private residence Living Arrangements: Spouse/significant other Available Help at Discharge: Family;Available 24 hours/day Type of Home: House Home Access: Level entry(2 small steps)     Home Layout: One level Home Equipment: Walker - 4 wheels;Cane - single point;Bedside commode;Shower seat;Grab bars - tub/shower;Wheelchair - Banker      Prior Function Level of Independence: Needs assistance   Gait / Transfers Assistance Needed: was walking with RW     Comments: pt has had several admissions recently. Wife is hoping to take him home.     Hand Dominance  Dominant Hand: Right    Extremity/Trunk Assessment   Upper Extremity Assessment Upper Extremity Assessment: Generalized weakness    Lower Extremity Assessment Lower Extremity Assessment: Generalized weakness       Communication   Communication: No difficulties  Cognition Arousal/Alertness: Awake/alert Behavior During Therapy: WFL for tasks assessed/performed Overall Cognitive Status: History of cognitive impairments - at baseline                                        General Comments      Exercises     Assessment/Plan    PT Assessment  Patient needs continued PT services  PT Problem List Decreased strength;Decreased range of motion;Decreased activity tolerance;Decreased mobility;Decreased balance;Cardiopulmonary status limiting activity;Decreased cognition       PT Treatment Interventions DME instruction;Gait training;Functional mobility training;Therapeutic activities;Therapeutic exercise;Patient/family education;Balance training    PT Goals (Current goals can be found in the Care Plan section)  Acute Rehab PT Goals Patient Stated Goal: pt to be stronger, go home PT Goal Formulation: With patient/family Time For Goal Achievement: 07/27/17 Potential to Achieve Goals: Fair    Frequency Min 3X/week   Barriers to discharge Decreased caregiver support      Co-evaluation               AM-PAC PT "6 Clicks" Daily Activity  Outcome Measure Difficulty turning over in bed (including adjusting bedclothes, sheets and blankets)?: Unable Difficulty moving from lying on back to sitting on the side of the bed? : Unable Difficulty sitting down on and standing up from a chair with arms (e.g., wheelchair, bedside commode, etc,.)?: Unable Help needed moving to and from a bed to chair (including a wheelchair)?: A Lot Help needed walking in hospital room?: Total Help needed climbing 3-5 steps with a railing? : Total 6 Click Score: 7    End of Session Equipment Utilized During Treatment: Gait belt Activity Tolerance: Patient limited by fatigue Patient left: in chair;with call bell/phone within reach;with chair alarm set;with family/visitor present Nurse Communication: Mobility status PT Visit Diagnosis: Muscle weakness (generalized) (M62.81);Adult, failure to thrive (R62.7);Other abnormalities of gait and mobility (R26.89)    Time: 4163-8453 PT Time Calculation (min) (ACUTE ONLY): 24 min   Charges:   PT Evaluation $PT Eval Moderate Complexity: 1 Mod PT Treatments $Therapeutic Activity: 8-22 mins   PT G Codes:         Jyles Sontag L. Tamala Julian, [place] Pager [phone] 07/13/2017   Galen Manila 07/13/2017, 1:48 PM

## 2017-07-13 NOTE — Progress Notes (Signed)
PROGRESS NOTE  [name] [id number] DOB: Sep 17, 1933 DOA: 07/11/2017 PCP: [name], MD  HPI/Recap of past 24 hours: [name] is a 84 y.o. male with medical history significant for CAD, atrial fibrillation, seizure disorder, dementia, presented to the ED with lethargy. Pt with multiple admission for sepsis 2/2 recurrent aspiration pneumonia, about 3 times in the past couple of months, last admission from 4/22-4/26. Since discharge he has progressively become more lethargic and generally weak at home, unable to assist with his own care over past couple of days. Wife reported possible aspiration event PTA. In the ED, pt noted to have AKI, creat 1.5, BUN 30.  CXR looks maybe slightly worse than 4/22 with BLL opacities and small B pleural effusions. Pt admitted for further management.  Today, pt more awake, noted to be SOB, denies any chest pain, fever/chills. Wife at bedside.   Assessment/Plan: Principal Problem:   Acute metabolic encephalopathy Active Problems:   Essential hypertension   Seizure disorder (HCC)   Acute kidney injury ([place])   CKD (chronic kidney disease) stage 3, GFR 30-59 ml/min (HCC)   Acute encephalopathy   Chronic diastolic CHF (congestive heart failure) (HCC)   PAF (paroxysmal atrial fibrillation) (HCC)   Aspiration syndrome, subsequent encounter  Acute metabolic encephalopathy Improved ?? Aspiration PNA vs ??sepsis Afebrile, no leukocytosis Procalcitonin <0.10, LA WNL BC X 2: NGTD CXR showed: Possible consolidation or atelectasis in the lung bases with small bilateral pleural effusions appears to be increasing. Repeat CXR on 07/12/17 with mild improvement overall, still with some congestion UA negative, UC with multiple species, repeat UC pending recollection Will monitor closely, continue to hold off antibiotics for now  AKI Improving ? Poor oral intake, though with BLE edema (at baseline) Vs cardiorenal Determining vol status quite tricky in  this patient Discontinue IVF, due to SOB Gave 1 dose of lasix, will reassess in am Strict I &O Daily BMP  Iron def anemia Hgb at baseline Continue PO iron supplement  Dysphagia SLP consulted, rec full liquid, nectar thick, nutritional supplements Aspiration precautions  Seizure disorder Continue depakote, levels WNL  PAF Continue Cardizem and toprol Continue eliquis  Hypothyroidism Continue synthroid  Chronic RLE wound Wound care consult  Mild dementia at baseline  Chronic steroid use (has been on it for years ??s/p knee surgery) Cont prednisone      Code Status: DNR   Family Communication: Wife at bedside  Disposition Plan:  Recommend SNF, pt refusing   Consultants:  None   Procedures:  None  Antimicrobials:  None  DVT prophylaxis:  Apixiban   Objective: Vitals:   07/12/17 2255 07/13/17 0632 07/13/17 1404 07/13/17 1453  BP: 116/78 137/74 124/74   Pulse: 77 73 78   Resp: 20 (!) 21 (!) 21   Temp: 98.1 F (36.7 C) 97.8 F (36.6 C) 98 F (36.7 C)   TempSrc: Axillary Oral Oral   SpO2: 98% 96%  93%    Intake/Output Summary (Last 24 hours) at 07/13/2017 1601 Last data filed at 07/13/2017 1100 Gross per 24 hour  Intake 60 ml  Output 825 ml  Net -765 ml   There were no vitals filed for this visit.  Exam:   General:  Awake, alert  Cardiovascular: S1, S2 irregular   Respiratory: Mild crackles heard at the b/l bases  Abdomen: soft, NT, ND, BS present  Musculoskeletal: 2+ BLE pitting edema, with ace wrap  Skin: Wound on RLE with ace wrap  Psychiatry: Normal  Data Reviewed: CBC: Recent Labs  Lab 07/11/17 0346 07/12/17 0624 07/13/17 0557  WBC 7.6 7.1 7.9  NEUTROABS 5.5 5.2 6.3  HGB 9.4* 9.2* 9.3*  HCT 30.0* 29.2* 29.2*  MCV 96.5 97.3 96.7  PLT 115* 103* 295*   Basic Metabolic Panel: Recent Labs  Lab 07/11/17 0346 07/11/17 0831 07/12/17 0624 07/13/17 0557  NA 138  --  139 138  K 3.8  --  4.0 3.8  CL 103  --  106  103  CO2 23  --  23 23  GLUCOSE 87  --  86 89  BUN 30*  --  29* 30*  CREATININE 1.48*  --  1.56* 1.30*  CALCIUM 8.2*  --  7.8* 7.6*  MG  --  1.7  --   --    GFR: Estimated Creatinine Clearance: 46 mL/min (A) (by C-G formula based on SCr of 1.3 mg/dL (H)). Liver Function Tests: Recent Labs  Lab 07/11/17 0831  AST 16  ALT 14*  ALKPHOS 45  BILITOT 0.9  PROT 5.5*  ALBUMIN 3.1*   No results for input(s): LIPASE, AMYLASE in the last 168 hours. No results for input(s): AMMONIA in the last 168 hours. Coagulation Profile: No results for input(s): INR, PROTIME in the last 168 hours. Cardiac Enzymes: No results for input(s): CKTOTAL, CKMB, CKMBINDEX, TROPONINI in the last 168 hours. BNP (last 3 results) No results for input(s): PROBNP in the last 8760 hours. HbA1C: Recent Labs    07/12/17 0624  HGBA1C 5.0   CBG: No results for input(s): GLUCAP in the last 168 hours. Lipid Profile: Recent Labs    07/12/17 0624  CHOL 82  HDL 46  LDLCALC 27  TRIG 45  CHOLHDL 1.8   Thyroid Function Tests: Recent Labs    07/12/17 0624  TSH 0.452  FREET4 1.11   Anemia Panel: No results for input(s): VITAMINB12, FOLATE, FERRITIN, TIBC, IRON, RETICCTPCT in the last 72 hours. Urine analysis:    Component Value Date/Time   COLORURINE YELLOW 07/11/2017 0346   APPEARANCEUR CLEAR 07/11/2017 0346   LABSPEC 1.015 07/11/2017 0346   PHURINE 5.0 07/11/2017 0346   GLUCOSEU NEGATIVE 07/11/2017 0346   HGBUR NEGATIVE 07/11/2017 0346   BILIRUBINUR NEGATIVE 07/11/2017 0346   KETONESUR 5 (A) 07/11/2017 0346   PROTEINUR NEGATIVE 07/11/2017 0346   UROBILINOGEN 1.0 09/16/2007 1212   NITRITE NEGATIVE 07/11/2017 0346   LEUKOCYTESUR NEGATIVE 07/11/2017 0346   Sepsis Labs: @LABRCNTIP (procalcitonin:4,lacticidven:4)  ) Recent Results (from the past 240 hour(s))  Culture, blood (routine x 2) Call MD if unable to obtain prior to antibiotics being given     Status: None (Preliminary result)   Collection  Time: 07/11/17  8:31 AM  Result Value Ref Range Status   Specimen Description   Final    BLOOD RIGHT ANTECUBITAL Performed at [facility], [place] [address], [place], [place] [address]    Special Requests   Final    BOTTLES DRAWN AEROBIC ONLY Blood Culture adequate volume Performed at [facility] [address], [place], [place] [address]    Culture   Final    NO GROWTH 2 DAYS Performed at [facility] [address], [place], [place] [address]    Report Status PENDING  Incomplete  Culture, blood (routine x 2) Call MD if unable to obtain prior to antibiotics being given     Status: None (Preliminary result)   Collection Time: 07/11/17  8:43 AM  Result Value Ref Range Status  Specimen Description   Final    BLOOD LEFT HAND Performed at [facility] [address], [place], [place] [address]    Special Requests   Final    BOTTLES DRAWN AEROBIC AND ANAEROBIC Blood Culture adequate volume Performed at [facility] [address], [place], [place] [address]    Culture   Final    NO GROWTH 2 DAYS Performed at [facility] [address], [place], [place] [address]    Report Status PENDING  Incomplete  Culture, Urine     Status: Abnormal   Collection Time: 07/11/17  4:34 PM  Result Value Ref Range Status   Specimen Description   Final    URINE, RANDOM Performed at [facility] [address] [place], [place] [address]    Culture MULTIPLE SPECIES PRESENT, SUGGEST RECOLLECTION (A)  Final   Report Status 07/12/2017 FINAL  Final      Studies: Dg Chest Port 1 View  Result Date: 07/12/2017 CLINICAL DATA:  Initial evaluation for acute shortness of breath, dyspnea, question aspiration. EXAM: PORTABLE CHEST 1 VIEW COMPARISON:  Prior radiograph from 07/11/2017. FINDINGS: Cardiomegaly, stable from previous. Mediastinal silhouette within normal limits. Lungs are hypoinflated.  Diffuse vascular congestion with interstitial prominence consistent with pulmonary interstitial edema, slightly improved from previous. Bilateral pleural effusions with associated bibasilar opacities, which may reflect atelectasis or infiltrates. Overall appearance is stable to slightly improved from previous with no new focal infiltrates to suggest interval aspiration. No pneumothorax. Osseous structures unchanged. IMPRESSION: Cardiomegaly with diffuse pulmonary interstitial edema and small bilateral pleural effusions associated bibasilar opacities may reflect atelectasis and/or infiltrates. Overall, appearance is stable to slightly improved relative to previous, with no new focal infiltrates to suggest interval aspiration identified. Electronically Signed   By: Jeannine Boga M.D.   On: 07/12/2017 17:15    Scheduled Meds: . apixaban  2.5 mg Oral BID  . calcium carbonate  1,250 mg Oral BID WC  . cholecalciferol  1,000 Units Oral BID  . collagenase   Topical Daily  . diltiazem  300 mg Oral Daily  . divalproex  250 mg Oral Daily  . divalproex  750 mg Oral QHS  . doxazosin  4 mg Oral Daily  . ezetimibe  5 mg Oral QHS  . feeding supplement  1 Container Oral TID BM  . feeding supplement (ENSURE ENLIVE)  237 mL Oral TID BM  . feeding supplement (PRO-STAT SUGAR FREE 64)  30 mL Oral BID  . ferrous sulfate  325 mg Oral BID WC  . gabapentin  300 mg Oral BID  . ipratropium-albuterol  3 mL Nebulization BID  . levothyroxine  150 mcg Oral QAC breakfast  . lidocaine  1 patch Transdermal Q24H  . metoprolol tartrate  25 mg Oral BID  . pantoprazole  40 mg Oral Daily  . pravastatin  40 mg Oral QHS  . predniSONE  10 mg Oral Q breakfast  . vitamin B-12  500 mcg Oral QHS    Continuous Infusions:    LOS: 2 days     Alma Friendly, MD [facility]   If 7PM-7AM, please contact night-coverage [email or web address] Password [email or web address] 07/13/2017, 4:01 PM

## 2017-07-14 ENCOUNTER — Inpatient Hospital Stay (HOSPITAL_COMMUNITY): Payer: Medicare Other

## 2017-07-14 LAB — CBC WITH DIFFERENTIAL/PLATELET
Basophils Absolute: 0 10*3/uL (ref 0.0–0.1)
Basophils Relative: 0 %
Eosinophils Absolute: 0.1 10*3/uL (ref 0.0–0.7)
Eosinophils Relative: 1 %
HCT: 27.6 % — ABNORMAL LOW (ref 39.0–52.0)
Hemoglobin: 8.8 g/dL — ABNORMAL LOW (ref 13.0–17.0)
Lymphocytes Relative: 8 %
Lymphs Abs: 0.7 10*3/uL (ref 0.7–4.0)
MCH: 30.8 pg (ref 26.0–34.0)
MCHC: 31.9 g/dL (ref 30.0–36.0)
MCV: 96.5 fL (ref 78.0–100.0)
Monocytes Absolute: 0.9 10*3/uL (ref 0.1–1.0)
Monocytes Relative: 11 %
Neutro Abs: 6.6 10*3/uL (ref 1.7–7.7)
Neutrophils Relative %: 80 %
Platelets: 132 10*3/uL — ABNORMAL LOW (ref 150–400)
RBC: 2.86 MIL/uL — ABNORMAL LOW (ref 4.22–5.81)
RDW: 14.9 % (ref 11.5–15.5)
WBC: 8.3 10*3/uL (ref 4.0–10.5)

## 2017-07-14 LAB — BLOOD GAS, ARTERIAL
Acid-Base Excess: 1.4 mmol/L (ref 0.0–2.0)
Bicarbonate: 24.9 mmol/L (ref 20.0–28.0)
Drawn by: 257701
O2 Content: 2 L/min
O2 Saturation: 92.2 %
Patient temperature: 98.6
pCO2 arterial: 36.9 mmHg (ref 32.0–48.0)
pH, Arterial: 7.445 (ref 7.350–7.450)
pO2, Arterial: 65.7 mmHg — ABNORMAL LOW (ref 83.0–108.0)

## 2017-07-14 LAB — BASIC METABOLIC PANEL
Anion gap: 9 (ref 5–15)
BUN: 23 mg/dL — ABNORMAL HIGH (ref 6–20)
CO2: 25 mmol/L (ref 22–32)
Calcium: 7.8 mg/dL — ABNORMAL LOW (ref 8.9–10.3)
Chloride: 107 mmol/L (ref 101–111)
Creatinine, Ser: 1.17 mg/dL (ref 0.61–1.24)
GFR calc Af Amer: >60 mL/min (ref >60)
GFR calc non Af Amer: 57 mL/min — ABNORMAL LOW (ref >60)
Glucose, Bld: 81 mg/dL (ref 65–99)
Potassium: 3.6 mmol/L (ref 3.5–5.1)
Sodium: 141 mmol/L (ref 135–145)

## 2017-07-14 MED ORDER — IPRATROPIUM-ALBUTEROL 0.5-2.5 (3) MG/3ML IN SOLN
3.0000 mL | Freq: Three times a day (TID) | RESPIRATORY_TRACT | Status: DC
  Administered 2017-07-14 – 2017-07-18 (×11): 3 mL via RESPIRATORY_TRACT
  Filled 2017-07-14 (×11): qty 3

## 2017-07-14 MED ORDER — FUROSEMIDE 10 MG/ML IJ SOLN
20.0000 mg | Freq: Once | INTRAMUSCULAR | Status: AC
Start: 2017-07-14 — End: 2017-07-14
  Administered 2017-07-14: 20 mg via INTRAVENOUS
  Filled 2017-07-14: qty 2

## 2017-07-14 MED ORDER — APIXABAN 5 MG PO TABS
5.0000 mg | ORAL_TABLET | Freq: Two times a day (BID) | ORAL | Status: DC
  Administered 2017-07-14 (×2): 5 mg via ORAL
  Filled 2017-07-14 (×2): qty 1

## 2017-07-14 MED ORDER — FLUTICASONE PROPIONATE 50 MCG/ACT NA SUSP
1.0000 | Freq: Every day | NASAL | Status: DC
  Administered 2017-07-14 – 2017-07-19 (×6): 1 via NASAL
  Filled 2017-07-14: qty 16

## 2017-07-14 NOTE — Progress Notes (Signed)
PROGRESS NOTE  [name] [id number]:[id number] DOB: 07/04/1933 DOA: 07/11/2017 PCP: [name], MD  HPI/Recap of past 24 hours: [name] is a 84 y.o. male with medical history significant for CAD, atrial fibrillation, seizure disorder, dementia, presented to the ED with lethargy. Pt with multiple admission for sepsis 2/2 recurrent aspiration pneumonia, about 3 times in the past couple of months, last admission from 4/22-4/26. Since discharge he has progressively become more lethargic and generally weak at home, unable to assist with his own care over past couple of days. Wife reported possible aspiration event PTA. In the ED, pt noted to have AKI, creat 1.5, BUN 30.  CXR looks maybe slightly worse than 4/22 with BLL opacities and small B pleural effusions. Pt admitted for further management.  Today, pt noted to have increased work of breathing, generalized weakness, alert. Pt denies any new symptoms, although reports stuffy nose.  Had a long conversation with wife and patient, they both are considering SNF, although they had a bad experience at the last SNF.   Assessment/Plan: Principal Problem:   Acute metabolic encephalopathy Active Problems:   Essential hypertension   Seizure disorder (HCC)   Acute kidney injury ([place])   CKD (chronic kidney disease) stage 3, GFR 30-59 ml/min (HCC)   Acute encephalopathy   Chronic diastolic CHF (congestive heart failure) (HCC)   PAF (paroxysmal atrial fibrillation) (HCC)   Aspiration syndrome, subsequent encounter  Acute hypoxic respiratory failure Noted to still have mild increased work of breathing ABG showed PO2 of 65.7 on 2L of O2 CXR showed: Possible consolidation or atelectasis in the lung bases with small bilateral pleural effusions appears to be increasing. Repeat CXR on 07/12/17 with mild improvement overall, still with some congestion ECHO with Pulm HTN: 43mmHg, EF 60-65% CT chest pending Continuous O2, duonebs  Acute metabolic  encephalopathy Afebrile, no leukocytosis ?? Aspiration PNA vs ??sepsis Procalcitonin <0.10, LA WNL BC X 2: NGTD UA negative, repeat UC with 20,000 E.coli Continue to hold off antibiotics for now, may treat  AKI Improving ? Poor oral intake, though with BLE edema (at baseline) Vs cardiorenal Determining vol status quite tricky in this patient Discontinue IVF, due to SOB Gave another dose of lasix, will reassess in am Strict I &O Daily BMP  Iron def anemia Hgb at baseline Continue PO iron supplement  Dysphagia SLP consulted, rec full liquid, nectar thick, nutritional supplements Aspiration precautions  Seizure disorder Continue depakote, levels WNL  PAF Continue Cardizem and toprol Continue eliquis  Hypothyroidism Continue synthroid  Chronic RLE wound Wound care consult  Mild dementia at baseline  Chronic steroid use (has been on it for years ??s/p knee surgery) Cont prednisone      Code Status: DNR   Family Communication: Wife at bedside  Disposition Plan: Had a long conversation with wife and patient on 5/5, they both are considering SNF, although they had a bad experience at the last SNF    Consultants:  None   Procedures:  None  Antimicrobials:  None  DVT prophylaxis:  Apixiban   Objective: Vitals:   07/13/17 2120 07/14/17 0622 07/14/17 1008 07/14/17 1411  BP: (!) 141/73 (!) 141/75  122/76  Pulse: 80 89  72  Resp: (!) 22 20  (!) 21  Temp: 98 F (36.7 C) 97.8 F (36.6 C)  97.8 F (36.6 C)  TempSrc: Oral Oral    SpO2: 96% 99% 96% 93%    Intake/Output Summary (Last 24 hours) at 07/14/2017 1607 Last data  filed at 07/14/2017 0900 Gross per 24 hour  Intake 230 ml  Output 825 ml  Net -595 ml   There were no vitals filed for this visit.  Exam:   General:  Awake, alert, increased WOB  Cardiovascular: S1, S2 irregular   Respiratory: Mild crackles heard at the b/l bases  Abdomen: soft, NT, ND, BS present  Musculoskeletal: 2+  BLE pitting edema, with ace wrap  Skin: Wound on RLE with ace wrap  Psychiatry: Normal    Data Reviewed: CBC: Recent Labs  Lab 07/11/17 0346 07/12/17 0624 07/13/17 0557 07/14/17 0526  WBC 7.6 7.1 7.9 8.3  NEUTROABS 5.5 5.2 6.3 6.6  HGB 9.4* 9.2* 9.3* 8.8*  HCT 30.0* 29.2* 29.2* 27.6*  MCV 96.5 97.3 96.7 96.5  PLT 115* 103* 115* 941*   Basic Metabolic Panel: Recent Labs  Lab 07/11/17 0346 07/11/17 0831 07/12/17 0624 07/13/17 0557 07/14/17 0526  NA 138  --  139 138 141  K 3.8  --  4.0 3.8 3.6  CL 103  --  106 103 107  CO2 23  --  23 23 25   GLUCOSE 87  --  86 89 81  BUN 30*  --  29* 30* 23*  CREATININE 1.48*  --  1.56* 1.30* 1.17  CALCIUM 8.2*  --  7.8* 7.6* 7.8*  MG  --  1.7  --   --   --    GFR: Estimated Creatinine Clearance: 51.1 mL/min (by C-G formula based on SCr of 1.17 mg/dL). Liver Function Tests: Recent Labs  Lab 07/11/17 0831  AST 16  ALT 14*  ALKPHOS 45  BILITOT 0.9  PROT 5.5*  ALBUMIN 3.1*   No results for input(s): LIPASE, AMYLASE in the last 168 hours. No results for input(s): AMMONIA in the last 168 hours. Coagulation Profile: No results for input(s): INR, PROTIME in the last 168 hours. Cardiac Enzymes: No results for input(s): CKTOTAL, CKMB, CKMBINDEX, TROPONINI in the last 168 hours. BNP (last 3 results) No results for input(s): PROBNP in the last 8760 hours. HbA1C: Recent Labs    07/12/17 0624  HGBA1C 5.0   CBG: No results for input(s): GLUCAP in the last 168 hours. Lipid Profile: Recent Labs    07/12/17 0624  CHOL 82  HDL 46  LDLCALC 27  TRIG 45  CHOLHDL 1.8   Thyroid Function Tests: Recent Labs    07/12/17 0624  TSH 0.452  FREET4 1.11   Anemia Panel: No results for input(s): VITAMINB12, FOLATE, FERRITIN, TIBC, IRON, RETICCTPCT in the last 72 hours. Urine analysis:    Component Value Date/Time   COLORURINE YELLOW 07/11/2017 0346   APPEARANCEUR CLEAR 07/11/2017 0346   LABSPEC 1.015 07/11/2017 0346   PHURINE  5.0 07/11/2017 0346   GLUCOSEU NEGATIVE 07/11/2017 0346   HGBUR NEGATIVE 07/11/2017 0346   BILIRUBINUR NEGATIVE 07/11/2017 0346   KETONESUR 5 (A) 07/11/2017 0346   PROTEINUR NEGATIVE 07/11/2017 0346   UROBILINOGEN 1.0 09/16/2007 1212   NITRITE NEGATIVE 07/11/2017 0346   LEUKOCYTESUR NEGATIVE 07/11/2017 0346   Sepsis Labs: @LABRCNTIP (procalcitonin:4,lacticidven:4)  ) Recent Results (from the past 240 hour(s))  Culture, blood (routine x 2) Call MD if unable to obtain prior to antibiotics being given     Status: None (Preliminary result)   Collection Time: 07/11/17  8:31 AM  Result Value Ref Range Status   Specimen Description   Final    BLOOD RIGHT ANTECUBITAL Performed at [facility], [place] [address] [place], [place] [address]  Special Requests   Final    BOTTLES DRAWN AEROBIC ONLY Blood Culture adequate volume Performed at [facility] [address] [place], [place] [address]    Culture   Final    NO GROWTH 3 DAYS Performed at [facility], [place] [address], [place], [place] [address]    Report Status PENDING  Incomplete  Culture, blood (routine x 2) Call MD if unable to obtain prior to antibiotics being given     Status: None (Preliminary result)   Collection Time: 07/11/17  8:43 AM  Result Value Ref Range Status   Specimen Description   Final    BLOOD LEFT HAND Performed at [facility] [address], [place], [place] [address]    Special Requests   Final    BOTTLES DRAWN AEROBIC AND ANAEROBIC Blood Culture adequate volume Performed at [facility] [address], [place], [place] [address]    Culture   Final    NO GROWTH 3 DAYS Performed at [facility], [place] [address], [place], [place] [address]    Report Status PENDING  Incomplete  Culture, Urine     Status: Abnormal   Collection Time: 07/11/17  4:34 PM  Result Value Ref Range Status   Specimen Description   Final     URINE, RANDOM Performed at [facility] [address], [place], [place] [address]    Culture MULTIPLE SPECIES PRESENT, SUGGEST RECOLLECTION (A)  Final   Report Status 07/12/2017 FINAL  Final  Culture, Urine     Status: Abnormal (Preliminary result)   Collection Time: 07/13/17 12:30 AM  Result Value Ref Range Status   Specimen Description   Final    URINE, RANDOM Performed at [facility] [address] [place], [place] [address]    Special Requests   Final    NONE Performed at [facility], [place] [address], [place], [place] [address]    Culture (A)  Final    20,000 COLONIES/mL ESCHERICHIA COLI SUSCEPTIBILITIES TO FOLLOW Performed at [facility], [place] [address] [place],  [address]    Report Status PENDING  Incomplete      Studies: No results found.  Scheduled Meds: . apixaban  5 mg Oral BID  . calcium carbonate  1,250 mg Oral BID WC  . cholecalciferol  1,000 Units Oral BID  . collagenase   Topical Daily  . diltiazem  300 mg Oral Daily  . divalproex  250 mg Oral Daily  . divalproex  750 mg Oral QHS  . doxazosin  4 mg Oral Daily  . ezetimibe  5 mg Oral QHS  . feeding supplement  1 Container Oral TID BM  . feeding supplement (ENSURE ENLIVE)  237 mL Oral TID BM  . feeding supplement (PRO-STAT SUGAR FREE 64)  30 mL Oral BID  . ferrous sulfate  325 mg Oral BID WC  . fluticasone  1 spray Each Nare Daily  . gabapentin  300 mg Oral BID  . ipratropium-albuterol  3 mL Nebulization TID  . levothyroxine  150 mcg Oral QAC breakfast  . lidocaine  1 patch Transdermal Q24H  . metoprolol tartrate  25 mg Oral BID  . pantoprazole  40 mg Oral Daily  . pravastatin  40 mg Oral QHS  . predniSONE  10 mg Oral Q breakfast  . vitamin B-12  500 mcg Oral QHS    Continuous Infusions:  LOS: 3 days     Alma Friendly, MD [facility]   If 7PM-7AM, please contact  night-coverage [email or web address] Password [email or web address] 07/14/2017, 4:07 PM

## 2017-07-14 NOTE — Evaluation (Signed)
Occupational Therapy Evaluation Patient Details Name: [name] MRN: [id number] DOB: 12/30/1933 Today's Date: 07/14/2017    History of Present Illness [name] is a 84 y.o. male with medical history significant for CAD, atrial fibrillation, seizure disorder, dementia, presented to the ED with lethargy. Pt with multiple admission for sepsis 2/2 recurrent aspiration pneumonia, about 3 times in the past couple of months, last admission from 4/22-4/26. Since discharge he has progressively become more lethargic and generally weak at home, unable to assist with his own care over past couple of days.    Clinical Impression   Pt was admitted for the above.  He was seen by this therapist during last admission.  He is much weaker now.  Will continue to focus on mobility to assist wife with adls.  Again, wife wants to take him home from hospital and not go to snf.    Follow Up Recommendations  SNF;Supervision/Assistance - 24 hour;Home health OT(wife does not want snf) Pt would benefit from snf    Equipment Recommendations  None recommended by OT    Recommendations for Other Services       Precautions / Restrictions Precautions Precautions: Fall Restrictions Weight Bearing Restrictions: No      Mobility Bed Mobility     Rolling: (min to mod to L; max to R )       Sit to sidelying: Mod assist General bed mobility comments: needs +2 to maintain sidelying on R  Transfers                 General transfer comment: not attempted today    Balance   Sitting-balance support: Feet supported;Bilateral upper extremity supported Sitting balance-Leahy Scale: Fair                                     ADL either performed or assessed with clinical judgement   ADL   Eating/Feeding: Minimal assistance;Bed level   Grooming: Minimal assistance;Bed level   Upper Body Bathing: Maximal assistance;Bed level   Lower Body Bathing: Total assistance;+2 for physical  assistance;Bed level   Upper Body Dressing : Maximal assistance;Bed level   Lower Body Dressing: Total assistance;+2 for physical assistance;Bed level                 General ADL Comments: assessed for adls at bed level as pt is much weaker.  Can roll to L with +1 assistance, but needs +2 to go to R and maintain.  Educated wife on using pad, rolling to L first then to R during adls.  Pt sat up at EOB and worked on trunk control after getting cleaned up secondary to [facility] incontinence     Vision         Perception     Praxis      Pertinent Vitals/Pain Pain Assessment: Faces Faces Pain Scale: Hurts even more Pain Location: buttocks Pain Descriptors / Indicators: Sore(during hygiene) Pain Intervention(s): Limited activity within patient's tolerance;Monitored during session;Repositioned     Hand Dominance Right   Extremity/Trunk Assessment Upper Extremity Assessment Upper Extremity Assessment: Generalized weakness LUE Deficits / Details: recently has not been able to lift LUE on his own.  AAROM to 80 tolerated without pain.  Wife reports bil biceps tendons have been detached           Communication Communication Communication: No difficulties   Cognition Arousal/Alertness: Awake/alert Behavior During Therapy: [facility]  for tasks assessed/performed Overall Cognitive Status: History of cognitive impairments - at baseline                                 General Comments: short term memory deficit   General Comments       Exercises Exercises: Other exercises Other Exercises Other Exercises: worked on trunk extension, scapula retraction  in sitting   Shoulder Instructions      Home Living Family/patient expects to be discharged to:: Private residence Living Arrangements: Spouse/significant other Available Help at Discharge: Family;Available 24 hours/day               Bathroom Shower/Tub: Occupational psychologist: Handicapped height      Home Equipment: Environmental consultant - 4 wheels;Cane - single point;Bedside commode;Shower seat;Grab bars - tub/shower;Wheelchair - Banker          Prior Functioning/Environment      ADL's / Homemaking Assistance Needed: required extensive assistance   Comments: several admissions; had [facility] services        OT Problem List: Decreased strength;Decreased activity tolerance;Impaired balance (sitting and/or standing);Decreased cognition;Decreased knowledge of use of DME or AE      OT Treatment/Interventions: Self-care/ADL training;DME and/or AE instruction;Patient/family education;Balance training;Therapeutic activities    OT Goals(Current goals can be found in the care plan section) Acute Rehab OT Goals Patient Stated Goal: pt to be stronger, go home OT Goal Formulation: With family Time For Goal Achievement: 07/28/17 Potential to Achieve Goals: Fair ADL Goals Additional ADL Goal #1: pt will roll to L with min A and to R with mod A and maintain sidelying with hands on top 1/2 of hospital bed rail to assist wife with LB adls Additional ADL Goal #2: pt will perform bed mobility at min A level in preparation for adls Additional ADL Goal #3: pt will go from sit to stand with mod +2 assistance for adls  OT Frequency: Min 2X/week   Barriers to D/C:            Co-evaluation              AM-PAC PT "6 Clicks" Daily Activity     Outcome Measure Help from another person eating meals?: A Little Help from another person taking care of personal grooming?: A Little Help from another person toileting, which includes using toliet, bedpan, or urinal?: A Lot Help from another person bathing (including washing, rinsing, drying)?: A Lot Help from another person to put on and taking off regular upper body clothing?: A Lot Help from another person to put on and taking off regular lower body clothing?: Total 6 Click Score: 13   End of Session    Activity Tolerance: Patient limited  by fatigue Patient left: in bed;with call bell/phone within reach;with bed alarm set;with family/visitor present  OT Visit Diagnosis: Muscle weakness (generalized) (M62.81)                Time: 6761-9509 OT Time Calculation (min): 46 min Charges:  OT General Charges $OT Visit: 1 Visit OT Evaluation $OT Eval Low Complexity: 1 Low OT Treatments $Self Care/Home Management : 8-22 mins $Therapeutic Activity: 8-22 mins G-Codes:     [place], OTR/L [phone] 07/14/2017  Darroll Bredeson 07/14/2017, 12:25 PM

## 2017-07-15 ENCOUNTER — Inpatient Hospital Stay (HOSPITAL_COMMUNITY): Payer: Medicare Other

## 2017-07-15 LAB — BODY FLUID CELL COUNT WITH DIFFERENTIAL
Eos, Fluid: 0 %
Lymphs, Fluid: 11 %
Monocyte-Macrophage-Serous Fluid: 60 % (ref 50–90)
Neutrophil Count, Fluid: 29 % — ABNORMAL HIGH (ref 0–25)
Total Nucleated Cell Count, Fluid: 1175 cu mm — ABNORMAL HIGH (ref 0–1000)

## 2017-07-15 LAB — CBC WITH DIFFERENTIAL/PLATELET
Basophils Absolute: 0 10*3/uL (ref 0.0–0.1)
Basophils Relative: 0 %
Eosinophils Absolute: 0.1 10*3/uL (ref 0.0–0.7)
Eosinophils Relative: 1 %
HCT: 29.2 % — ABNORMAL LOW (ref 39.0–52.0)
Hemoglobin: 9 g/dL — ABNORMAL LOW (ref 13.0–17.0)
Lymphocytes Relative: 13 %
Lymphs Abs: 0.8 10*3/uL (ref 0.7–4.0)
MCH: 30.1 pg (ref 26.0–34.0)
MCHC: 30.8 g/dL (ref 30.0–36.0)
MCV: 97.7 fL (ref 78.0–100.0)
Monocytes Absolute: 0.7 10*3/uL (ref 0.1–1.0)
Monocytes Relative: 12 %
Neutro Abs: 4.3 10*3/uL (ref 1.7–7.7)
Neutrophils Relative %: 74 %
Platelets: 145 10*3/uL — ABNORMAL LOW (ref 150–400)
RBC: 2.99 MIL/uL — ABNORMAL LOW (ref 4.22–5.81)
RDW: 14.7 % (ref 11.5–15.5)
WBC: 5.8 10*3/uL (ref 4.0–10.5)

## 2017-07-15 LAB — URINE CULTURE: Culture: 20000 — AB

## 2017-07-15 LAB — PROTEIN, PLEURAL OR PERITONEAL FLUID: Total protein, fluid: <3 g/dL

## 2017-07-15 LAB — BASIC METABOLIC PANEL
Anion gap: 9 (ref 5–15)
BUN: 20 mg/dL (ref 6–20)
CO2: 26 mmol/L (ref 22–32)
Calcium: 8.2 mg/dL — ABNORMAL LOW (ref 8.9–10.3)
Chloride: 107 mmol/L (ref 101–111)
Creatinine, Ser: 1.07 mg/dL (ref 0.61–1.24)
GFR calc Af Amer: >60 mL/min (ref >60)
GFR calc non Af Amer: >60 mL/min (ref >60)
Glucose, Bld: 87 mg/dL (ref 65–99)
Potassium: 3.7 mmol/L (ref 3.5–5.1)
Sodium: 142 mmol/L (ref 135–145)

## 2017-07-15 LAB — GRAM STAIN

## 2017-07-15 LAB — GLUCOSE, PLEURAL OR PERITONEAL FLUID: Glucose, Fluid: 104 mg/dL

## 2017-07-15 LAB — LACTATE DEHYDROGENASE: LDH: 169 U/L (ref 98–192)

## 2017-07-15 LAB — LACTATE DEHYDROGENASE, PLEURAL OR PERITONEAL FLUID: LD, Fluid: 69 U/L — ABNORMAL HIGH (ref 3–23)

## 2017-07-15 MED ORDER — APIXABAN 5 MG PO TABS
5.0000 mg | ORAL_TABLET | Freq: Two times a day (BID) | ORAL | Status: DC
  Administered 2017-07-15 – 2017-07-19 (×8): 5 mg via ORAL
  Filled 2017-07-15 (×8): qty 1

## 2017-07-15 MED ORDER — LIDOCAINE HCL 1 % IJ SOLN
INTRAMUSCULAR | Status: AC
  Filled 2017-07-15: qty 20

## 2017-07-15 MED ORDER — FUROSEMIDE 10 MG/ML IJ SOLN
40.0000 mg | Freq: Every day | INTRAMUSCULAR | Status: DC
  Administered 2017-07-15 – 2017-07-17 (×3): 40 mg via INTRAVENOUS
  Filled 2017-07-15 (×3): qty 4

## 2017-07-15 NOTE — Progress Notes (Signed)
PROGRESS NOTE  [name]:[id number] DOB: Jul 20, 1933 DOA: 07/11/2017 PCP: [name], MD  HPI/Recap of past 24 hours: [name] is a 84 y.o. male with medical history significant for CAD, atrial fibrillation, seizure disorder, dementia, presented to the ED with lethargy. Pt with multiple admission for sepsis 2/2 recurrent aspiration pneumonia, about 3 times in the past couple of months, last admission from 4/22-4/26. Since discharge he has progressively become more lethargic and generally weak at home, unable to assist with his own care over past couple of days. Wife reported possible aspiration event PTA. In the ED, pt noted to have AKI, creat 1.5, BUN 30.  CXR looks maybe slightly worse than 4/22 with BLL opacities and small B pleural effusions. Pt admitted for further management.  Today, pt reported feeling better, still with some increased WOB, although improved on lasix. Pt denies any new symptoms. CT chest showed large R pleural effusion, for thoracentesis today.   Assessment/Plan: Principal Problem:   Acute metabolic encephalopathy Active Problems:   Essential hypertension   Seizure disorder (HCC)   Acute kidney injury ([place])   CKD (chronic kidney disease) stage 3, GFR 30-59 ml/min (HCC)   Acute encephalopathy   Chronic diastolic CHF (congestive heart failure) (HCC)   PAF (paroxysmal atrial fibrillation) (HCC)   Aspiration syndrome, subsequent encounter  Acute hypoxic respiratory failure 2/2 R pleural effusion/HCAP S/P R thoracentesis yielding 1.1L of fluid, analysis pending  ABG showed PO2 of 65.7 on 2L of O2 CXR showed: Possible consolidation or atelectasis in the lung bases with small bilateral pleural effusions appears to be increasing. Repeat CXR on 07/15/17 post thoracentesis with no pneumothorax CT chest showed: Large right and moderate left pleural effusions with collapse/consolidation in the adjacent lower lobes ECHO with Pulm HTN: 17mmHg, EF  60-65% Continue IV lasix  Continuous O2, duonebs  Acute metabolic encephalopathy Afebrile, no leukocytosis ?? Aspiration PNA vs ??sepsis Procalcitonin <0.10, LA WNL BC X 2: NGTD UA negative, repeat UC with 20,000 E.coli Continue to hold off antibiotics for now  AKI Resolved ? Poor oral intake, though with BLE edema (at baseline) Vs cardiorenal Determining vol status quite tricky in this patient Discontinue IVF, due to SOB Strict I &O Daily BMP  Iron def anemia Hgb at baseline Continue PO iron supplement  Dysphagia SLP consulted, rec full liquid, nectar thick, nutritional supplements Aspiration precautions  Seizure disorder Continue depakote, levels WNL  PAF Continue Cardizem and toprol Continue eliquis  Hypothyroidism Continue synthroid  Chronic RLE wound Wound care consult  Mild dementia at baseline  Chronic steroid use (has been on it for years ??s/p knee surgery) Cont prednisone      Code Status: DNR   Family Communication: Wife at bedside  Disposition Plan: SNF   Consultants:  None   Procedures:  R thoracentesis on 07/15/17  Antimicrobials:  None  DVT prophylaxis:  Apixiban   Objective: Vitals:   07/15/17 1142 07/15/17 1210 07/15/17 1400 07/15/17 1457  BP: (!) 106/57 (!) 92/57 103/68   Pulse:   68   Resp:   15   Temp:   99 F (37.2 C)   TempSrc:   Oral   SpO2:   98% 99%    Intake/Output Summary (Last 24 hours) at 07/15/2017 1805 Last data filed at 07/15/2017 1728 Gross per 24 hour  Intake 50 ml  Output 1150 ml  Net -1100 ml   There were no vitals filed for this visit.  Exam:   General:  Awake,  alert, increased WOB  Cardiovascular: S1, S2 irregular   Respiratory: Mild crackles heard at the b/l bases  Abdomen: soft, NT, ND, BS present  Musculoskeletal: 2+ BLE pitting edema, with ace wrap  Skin: Wound on RLE with ace wrap  Psychiatry: Normal    Data Reviewed: CBC: Recent Labs  Lab 07/11/17 0346  07/12/17 0624 07/13/17 0557 07/14/17 0526 07/15/17 0545  WBC 7.6 7.1 7.9 8.3 5.8  NEUTROABS 5.5 5.2 6.3 6.6 4.3  HGB 9.4* 9.2* 9.3* 8.8* 9.0*  HCT 30.0* 29.2* 29.2* 27.6* 29.2*  MCV 96.5 97.3 96.7 96.5 97.7  PLT 115* 103* 115* 132* 563*   Basic Metabolic Panel: Recent Labs  Lab 07/11/17 0346 07/11/17 0831 07/12/17 0624 07/13/17 0557 07/14/17 0526 07/15/17 0545  NA 138  --  139 138 141 142  K 3.8  --  4.0 3.8 3.6 3.7  CL 103  --  106 103 107 107  CO2 23  --  23 23 25 26   GLUCOSE 87  --  86 89 81 87  BUN 30*  --  29* 30* 23* 20  CREATININE 1.48*  --  1.56* 1.30* 1.17 1.07  CALCIUM 8.2*  --  7.8* 7.6* 7.8* 8.2*  MG  --  1.7  --   --   --   --    GFR: Estimated Creatinine Clearance: 55.9 mL/min (by C-G formula based on SCr of 1.07 mg/dL). Liver Function Tests: Recent Labs  Lab 07/11/17 0831  AST 16  ALT 14*  ALKPHOS 45  BILITOT 0.9  PROT 5.5*  ALBUMIN 3.1*   No results for input(s): LIPASE, AMYLASE in the last 168 hours. No results for input(s): AMMONIA in the last 168 hours. Coagulation Profile: No results for input(s): INR, PROTIME in the last 168 hours. Cardiac Enzymes: No results for input(s): CKTOTAL, CKMB, CKMBINDEX, TROPONINI in the last 168 hours. BNP (last 3 results) No results for input(s): PROBNP in the last 8760 hours. HbA1C: No results for input(s): HGBA1C in the last 72 hours. CBG: No results for input(s): GLUCAP in the last 168 hours. Lipid Profile: No results for input(s): CHOL, HDL, LDLCALC, TRIG, CHOLHDL, LDLDIRECT in the last 72 hours. Thyroid Function Tests: No results for input(s): TSH, T4TOTAL, FREET4, T3FREE, THYROIDAB in the last 72 hours. Anemia Panel: No results for input(s): VITAMINB12, FOLATE, FERRITIN, TIBC, IRON, RETICCTPCT in the last 72 hours. Urine analysis:    Component Value Date/Time   COLORURINE YELLOW 07/11/2017 0346   APPEARANCEUR CLEAR 07/11/2017 0346   LABSPEC 1.015 07/11/2017 0346   PHURINE 5.0 07/11/2017 0346    GLUCOSEU NEGATIVE 07/11/2017 0346   HGBUR NEGATIVE 07/11/2017 0346   BILIRUBINUR NEGATIVE 07/11/2017 0346   KETONESUR 5 (A) 07/11/2017 0346   PROTEINUR NEGATIVE 07/11/2017 0346   UROBILINOGEN 1.0 09/16/2007 1212   NITRITE NEGATIVE 07/11/2017 0346   LEUKOCYTESUR NEGATIVE 07/11/2017 0346   Sepsis Labs: @LABRCNTIP (procalcitonin:4,lacticidven:4)  ) Recent Results (from the past 240 hour(s))  Culture, blood (routine x 2) Call MD if unable to obtain prior to antibiotics being given     Status: None (Preliminary result)   Collection Time: 07/11/17  8:31 AM  Result Value Ref Range Status   Specimen Description   Final    BLOOD RIGHT ANTECUBITAL Performed at [facility], [place] [address], [place], [place] [address]    Special Requests   Final    BOTTLES DRAWN AEROBIC ONLY Blood Culture adequate volume Performed at [facility] [address] [place],  [place] [address]    Culture   Final    NO GROWTH 4 DAYS Performed at [facility], [place] [address] [place], [place] [address]    Report Status PENDING  Incomplete  Culture, blood (routine x 2) Call MD if unable to obtain prior to antibiotics being given     Status: None (Preliminary result)   Collection Time: 07/11/17  8:43 AM  Result Value Ref Range Status   Specimen Description   Final    BLOOD LEFT HAND Performed at [facility] [address] [place], [place] [address]    Special Requests   Final    BOTTLES DRAWN AEROBIC AND ANAEROBIC Blood Culture adequate volume Performed at [facility] [address], [place], [place] [address]    Culture   Final    NO GROWTH 4 DAYS Performed at [facility], [place] [address] [place], [place] [address]    Report Status PENDING  Incomplete  Culture, Urine     Status: Abnormal   Collection Time: 07/11/17  4:34 PM  Result Value Ref Range Status   Specimen Description   Final    URINE,  RANDOM Performed at [facility] [address], [place], [place] [address]    Culture MULTIPLE SPECIES PRESENT, SUGGEST RECOLLECTION (A)  Final   Report Status 07/12/2017 FINAL  Final  Culture, Urine     Status: Abnormal   Collection Time: 07/13/17 12:30 AM  Result Value Ref Range Status   Specimen Description   Final    URINE, RANDOM Performed at [facility] [address], [place],  [address]    Special Requests   Final    NONE Performed at [facility], [place] [address] [place], [place] [address]    Culture 20,000 COLONIES/mL ESCHERICHIA COLI (A)  Final   Report Status 07/15/2017 FINAL  Final   Organism ID, Bacteria ESCHERICHIA COLI (A)  Final      Susceptibility   Escherichia coli - MIC*    AMPICILLIN <=2 SENSITIVE Sensitive     CEFAZOLIN <=4 SENSITIVE Sensitive     CEFTRIAXONE <=1 SENSITIVE Sensitive     CIPROFLOXACIN <=0.25 SENSITIVE Sensitive     GENTAMICIN <=1 SENSITIVE Sensitive     IMIPENEM <=0.25 SENSITIVE Sensitive     NITROFURANTOIN <=16 SENSITIVE Sensitive     TRIMETH/SULFA <=20 SENSITIVE Sensitive     AMPICILLIN/SULBACTAM <=2 SENSITIVE Sensitive     PIP/TAZO <=4 SENSITIVE Sensitive     Extended ESBL NEGATIVE Sensitive     * 20,000 COLONIES/mL ESCHERICHIA COLI      Studies: Dg Chest 1 View  Result Date: 07/15/2017 CLINICAL DATA:  Status post right thoracentesis today. EXAM: CHEST  1 VIEW COMPARISON:  CT chest 07/14/2017. Single-view of the chest 07/12/2017. FINDINGS: No pneumothorax is identified after thoracentesis. Right pleural effusion is decreased. Bibasilar airspace disease and left pleural effusion are unchanged. IMPRESSION: Negative for pneumothorax after right thoracentesis. No change in bibasilar airspace disease in the left effusion. Electronically Signed   By: Inge Rise M.D.   On: 07/15/2017 12:32   US Thoracentesis Asp Pleural Space W/img Guide  Result Date:  07/15/2017 INDICATION: Patient with history of coronary artery disease, CHF, prior aspiration pneumonia, bilateral pleural effusions, right greater than left. Request made for diagnostic and therapeutic right thoracentesis. EXAM: ULTRASOUND GUIDED DIAGNOSTIC AND THERAPEUTIC RIGHT THORACENTESIS MEDICATIONS: None COMPLICATIONS: None immediate. PROCEDURE: An ultrasound guided thoracentesis was thoroughly discussed with  the patient and questions answered. The benefits, risks, alternatives and complications were also discussed. The patient understands and wishes to proceed with the procedure. Written consent was obtained. Ultrasound was performed to localize and mark an adequate pocket of fluid in the right chest. The area was then prepped and draped in the normal sterile fashion. 1% Lidocaine was used for local anesthesia. Under ultrasound guidance a 6 Fr Safe-T-Centesis catheter was introduced. Thoracentesis was performed. The catheter was removed and a dressing applied. FINDINGS: A total of approximately 1.1 liters of yellow fluid was removed. Samples were sent to the laboratory as requested by the clinical team. IMPRESSION: Successful ultrasound guided diagnostic and therapeutic right thoracentesis yielding 1.1 liters of pleural fluid. Follow-up chest x-ray revealed no pneumothorax. Read by: Rowe Robert, PA-C Electronically Signed   By: Lucrezia Europe M.D.   On: 07/15/2017 12:36    Scheduled Meds: . calcium carbonate  1,250 mg Oral BID WC  . cholecalciferol  1,000 Units Oral BID  . collagenase   Topical Daily  . diltiazem  300 mg Oral Daily  . divalproex  250 mg Oral Daily  . divalproex  750 mg Oral QHS  . doxazosin  4 mg Oral Daily  . ezetimibe  5 mg Oral QHS  . feeding supplement  1 Container Oral TID BM  . feeding supplement (ENSURE ENLIVE)  237 mL Oral TID BM  . feeding supplement (PRO-STAT SUGAR FREE 64)  30 mL Oral BID  . ferrous sulfate  325 mg Oral BID WC  . fluticasone  1 spray Each Nare Daily   . furosemide  40 mg Intravenous Daily  . gabapentin  300 mg Oral BID  . ipratropium-albuterol  3 mL Nebulization TID  . levothyroxine  150 mcg Oral QAC breakfast  . lidocaine  1 patch Transdermal Q24H  . lidocaine      . metoprolol tartrate  25 mg Oral BID  . pantoprazole  40 mg Oral Daily  . pravastatin  40 mg Oral QHS  . predniSONE  10 mg Oral Q breakfast  . vitamin B-12  500 mcg Oral QHS    Continuous Infusions:    LOS: 4 days     Alma Friendly, MD [facility]   If 7PM-7AM, please contact night-coverage [email or web address] Password [email or web address] 07/15/2017, 6:05 PM

## 2017-07-15 NOTE — Consult Note (Signed)
[facility] Nurse wound consult note Reason for Consult: Areas of discoloration to left lower leg; "worsening wounds" to the right lower leg/ inner ankle per patient's spouse. After examining the right lower leg/inner ankle areas and interviewing the patient and his spouse, it was determined that what the spouse was concerned about regarding these areas was an area that had started to weep.  I placed a piece of silver hydrofiber over this area and redressed the wound per the orders entered by Letitia Caul on 07/11/17.  I agree with the plan for these sites.  The left lower leg, inner aspect, has a grey/yellow discolored area (epithelial) tissue is intact.  The site measures 1 cm x 2.3 cm x 0 depth.  The etiology of the discoloration is unclear.  The right achilles tendon area has a purple area of discoloration that measures 3 cm x 2 cm x 0 depth.  The presentation of both of these areas is consistent with DTIs.  The spouse reports that on Thursday a nurse for the patient (while roomed in 1516) wrapped the left foot and lower leg with an ACE wrap.  The plan of care for these areas is betadine to the areas of discoloration; allow to air dry; spiral wrap kerlex from just behind the toes to above the lower leg discoloration.  Change daily.  Also, to use Prevalon boots bilaterally at all times the patient is in the bed.  I further recommend that the medical team consider having a venous study to evaluate the veins and valves in both legs.  This would help [place] determine if venous insufficiency is contributing to extremity edema, which I understand has been a problem for the patient in the past.  Monitor the wound area(s) for worsening of condition such as: Signs/symptoms of infection,  Increase in size,  Development of or worsening of odor, Development of pain, or increased pain at the affected locations.  Notify the medical team if any of these develop.  Thank you for the consult.  Discussed plan of care with the patient  and bedside nurse.  [place] nurse will not follow at this time.  Please re-consult the [place] team if needed.  Val Riles, RN, MSN, CWOCN, CNS-BC, pager [phone]

## 2017-07-15 NOTE — Procedures (Signed)
Ultrasound-guided diagnostic and therapeutic right thoracentesis performed yielding 1.1 liters of yellow fluid. No immediate complications. Follow-up chest x-ray pending.The fluid was sent to the lab for preordered studies.

## 2017-07-15 NOTE — Progress Notes (Signed)
Physical Therapy Treatment Patient Details Name: [name] MRN: [id number] DOB: Jan 30, 1934 Today's Date: 07/15/2017    History of Present Illness [name] is a 84 y.o. male with medical history significant for CAD, atrial fibrillation, seizure disorder, dementia, presented to the ED with lethargy. Pt with multiple admission for sepsis 2/2 recurrent aspiration pneumonia, about 3 times in the past couple of months, last admission from 4/22-4/26. Since discharge he has progressively become more lethargic and generally weak at home, unable to assist with his own care over past couple of days.     PT Comments    Wife present during session and concerned about going to rehab vs home. Patient able to perform bed mobility with min assist, sit-to-stand with mod assist, and ambulate a 5 feet laterally to right with min assistance. Patient continues to be unsafe to discharge home alone due to decreased safety and ability with transfers and ambulation, and patient will benefit from continued post-acute therapy. Currently recommending skilled nursing facility stay unless caregivers can provide 24-hour supervision/assistance and are trained to provide the level of physical assistance patient currently needs for safe OOB mobility.      Follow Up Recommendations  SNF;Supervision/Assistance - 24 hour     Equipment Recommendations       Recommendations for Other Services       Precautions / Restrictions Precautions Precautions: Fall Restrictions Weight Bearing Restrictions: No    Mobility  Bed Mobility Overal bed mobility: Needs Assistance Bed Mobility: Supine to Sit     Supine to sit: Min assist        Transfers Overall transfer level: Needs assistance Equipment used: Rolling walker (2 wheeled) Transfers: Lateral/Scoot Transfers;Sit to/from [facility] Sit to Stand: Mod assist Stand pivot transfers: Min assist          Ambulation/Gait Ambulation/Gait  assistance: Min assist;Min guard Ambulation Distance (Feet): 5 Feet Assistive device: Rolling walker (2 wheeled) Gait Pattern/deviations: Step-through pattern;Decreased stride length;Trunk flexed Gait velocity: decreased   General Gait Details: side-step to right to transfer bed to chair; steppage gait with right leg required d/t right DF absent   Stairs             Wheelchair Mobility    Modified Rankin (Stroke Patients Only)       Balance Overall balance assessment: Needs assistance Sitting-balance support: Feet supported;Bilateral upper extremity supported Sitting balance-Leahy Scale: Fair     Standing balance support: Bilateral upper extremity supported;During functional activity Standing balance-Leahy Scale: Poor                              Cognition Arousal/Alertness: Awake/alert Behavior During Therapy: WFL for tasks assessed/performed Overall Cognitive Status: History of cognitive impairments - at baseline                                        Exercises General Exercises - Upper Extremity Shoulder Flexion: AROM;Right;10 reps General Exercises - Lower Extremity Hip Flexion/Marching: Strengthening;Seated;Both;10 reps    General Comments        Pertinent Vitals/Pain Pain Assessment: No/denies pain    Home Living Family/patient expects to be discharged to:: Private residence Living Arrangements: Spouse/significant other Available Help at Discharge: Family;Available 24 hours/day Type of Home: House Home Access: Level entry(2 small steps)   Home Layout: One level Home Equipment: Walker - 4  wheels;Cane - single point;Bedside commode;Shower seat;Grab bars - tub/shower;Wheelchair - Banker      Prior Function Level of Independence: Needs assistance  Gait / Transfers Assistance Needed: was walking with RW   Comments: pt has had several admissions recently. Wife is hoping to take him home.   PT Goals  (current goals can now be found in the care plan section) Acute Rehab PT Goals Patient Stated Goal: pt to be stronger, go home PT Goal Formulation: With patient/family Potential to Achieve Goals: Fair Progress towards PT goals: Progressing toward goals    Frequency    Min 3X/week      PT Plan      Co-evaluation              AM-PAC PT "6 Clicks" Daily Activity  Outcome Measure                   End of Session Equipment Utilized During Treatment: Gait belt;Oxygen Activity Tolerance: Patient limited by fatigue Patient left: in chair;with call bell/phone within reach;with family/visitor present Nurse Communication: Mobility status PT Visit Diagnosis: Muscle weakness (generalized) (M62.81);Adult, failure to thrive (R62.7);Other abnormalities of gait and mobility (R26.89)     Time: 2297-9892 PT Time Calculation (min) (ACUTE ONLY): 42 min  Charges:  $Gait Training: 8-22 mins $Therapeutic Exercise: 8-22 mins $Therapeutic Activity: 8-22 mins                    G Codes:       Robert Denker D. Hartnett-Rands, MS, PT Per [place] [phone] 07/15/2017, 2:25 PM

## 2017-07-15 NOTE — Progress Notes (Signed)
[place] for Xarelto Indication:   Allergies  Allergen Reactions  . Demerol [Meperidine] Nausea And Vomiting  . Keppra [Levetiracetam] Other (See Comments)    Causes sleepiness    Patient Measurements:  Vital Signs: Temp: 99 F (37.2 C) (05/06 1400) Temp Source: Oral (05/06 1400) BP: 103/68 (05/06 1400) Pulse Rate: 68 (05/06 1400)  Labs: Recent Labs    07/13/17 0557 07/14/17 0526 07/15/17 0545  HGB 9.3* 8.8* 9.0*  HCT 29.2* 27.6* 29.2*  PLT 115* 132* 145*  CREATININE 1.30* 1.17 1.07   Estimated Creatinine Clearance: 55.9 mL/min (by C-G formula based on SCr of 1.07 mg/dL).  Medications:  Scheduled:  . apixaban  5 mg Oral BID  . calcium carbonate  1,250 mg Oral BID WC  . cholecalciferol  1,000 Units Oral BID  . collagenase   Topical Daily  . diltiazem  300 mg Oral Daily  . divalproex  250 mg Oral Daily  . divalproex  750 mg Oral QHS  . doxazosin  4 mg Oral Daily  . ezetimibe  5 mg Oral QHS  . feeding supplement  1 Container Oral TID BM  . feeding supplement (ENSURE ENLIVE)  237 mL Oral TID BM  . feeding supplement (PRO-STAT SUGAR FREE 64)  30 mL Oral BID  . ferrous sulfate  325 mg Oral BID WC  . fluticasone  1 spray Each Nare Daily  . furosemide  40 mg Intravenous Daily  . gabapentin  300 mg Oral BID  . ipratropium-albuterol  3 mL Nebulization TID  . levothyroxine  150 mcg Oral QAC breakfast  . lidocaine  1 patch Transdermal Q24H  . lidocaine      . metoprolol tartrate  25 mg Oral BID  . pantoprazole  40 mg Oral Daily  . pravastatin  40 mg Oral QHS  . predniSONE  10 mg Oral Q breakfast  . vitamin B-12  500 mcg Oral QHS    Assessment: Home Eliquis 5 mg bid for Afib resumed on admit, dose reduced for small bump in SCr 5/3.  Text paged MD 5/5 informing them that dose can be inc back to 5mg  BID - dose changed back to 5mg  BID.  On 5/6 am, Apixaban ordered discontinued by MD, now resumed, continue Apixaban 5mg   bid  Goal of Therapy:  Monitor platelets by anticoagulation protocol: Yes   Plan:  Apixaban 5mg  bid Monitor renal function  Nyoka Cowden, Okechukwu Regnier L 07/15/2017,6:30 PM

## 2017-07-15 NOTE — Progress Notes (Signed)
SLP Cancellation Note  Patient Details Name: TYROME DONATELLI MRN: [id number] DOB: [birth year]   Cancelled treatment:       Reason Eval/Treat Not Completed: Other (comment)(pt sound asleep and wife currently sleeping sitting upright, RN reports pt tolerating po well, will follow up next date)   Macario Golds 07/15/2017, 5:18 PM  Luanna Salk, [facility] SLP [phone]

## 2017-07-16 LAB — CULTURE, BLOOD (ROUTINE X 2)
Culture: NO GROWTH
Culture: NO GROWTH
Special Requests: ADEQUATE
Special Requests: ADEQUATE

## 2017-07-16 LAB — CBC WITH DIFFERENTIAL/PLATELET
Basophils Absolute: 0 10*3/uL (ref 0.0–0.1)
Basophils Relative: 0 %
Eosinophils Absolute: 0.1 10*3/uL (ref 0.0–0.7)
Eosinophils Relative: 2 %
HCT: 29.7 % — ABNORMAL LOW (ref 39.0–52.0)
Hemoglobin: 9.1 g/dL — ABNORMAL LOW (ref 13.0–17.0)
Lymphocytes Relative: 13 %
Lymphs Abs: 0.8 10*3/uL (ref 0.7–4.0)
MCH: 29.8 pg (ref 26.0–34.0)
MCHC: 30.6 g/dL (ref 30.0–36.0)
MCV: 97.4 fL (ref 78.0–100.0)
Monocytes Absolute: 0.7 10*3/uL (ref 0.1–1.0)
Monocytes Relative: 11 %
Neutro Abs: 4.7 10*3/uL (ref 1.7–7.7)
Neutrophils Relative %: 74 %
Platelets: 155 10*3/uL (ref 150–400)
RBC: 3.05 MIL/uL — ABNORMAL LOW (ref 4.22–5.81)
RDW: 14.7 % (ref 11.5–15.5)
WBC: 6.4 10*3/uL (ref 4.0–10.5)

## 2017-07-16 LAB — BASIC METABOLIC PANEL
Anion gap: 10 (ref 5–15)
BUN: 23 mg/dL — ABNORMAL HIGH (ref 6–20)
CO2: 24 mmol/L (ref 22–32)
Calcium: 8.3 mg/dL — ABNORMAL LOW (ref 8.9–10.3)
Chloride: 105 mmol/L (ref 101–111)
Creatinine, Ser: 1.18 mg/dL (ref 0.61–1.24)
GFR calc Af Amer: >60 mL/min (ref >60)
GFR calc non Af Amer: 56 mL/min — ABNORMAL LOW (ref >60)
Glucose, Bld: 78 mg/dL (ref 65–99)
Potassium: 3.9 mmol/L (ref 3.5–5.1)
Sodium: 139 mmol/L (ref 135–145)

## 2017-07-16 LAB — PH, BODY FLUID: pH, Body Fluid: 7.5

## 2017-07-16 NOTE — Progress Notes (Signed)
PROGRESS NOTE  [name] WVP:[id number] DOB: [birth year] DOA: 07/11/2017 PCP: [name], MD  HPI/Recap of past 24 hours: [name] is a 84 y.o. male with medical history significant for CAD, atrial fibrillation, seizure disorder, dementia, presented to the ED with lethargy. Pt with multiple admission for sepsis 2/2 recurrent aspiration pneumonia, about 3 times in the past couple of months, last admission from 4/22-4/26. Since discharge he has progressively become more lethargic and generally weak at home, unable to assist with his own care over past couple of days. Wife reported possible aspiration event PTA. In the ED, pt noted to have AKI, creat 1.5, BUN 30.  CXR looks maybe slightly worse than 4/22 with BLL opacities and small B pleural effusions. Pt admitted for further management.  Today, pt reported feeling much better, improved breathing s/p thoracentesis. Pt denies any new symptoms. Wife and pt deciding whether to go home with [facility] V[facility] SNF   Assessment/Plan: Principal Problem:   Acute metabolic encephalopathy Active Problems:   Essential hypertension   Seizure disorder (HCC)   Acute kidney injury ([place])   CKD (chronic kidney disease) stage 3, GFR 30-59 ml/min (HCC)   Acute encephalopathy   Chronic diastolic CHF (congestive heart failure) (HCC)   PAF (paroxysmal atrial fibrillation) (HCC)   Aspiration syndrome, subsequent encounter  Acute hypoxic respiratory failure 2/2 R pleural effusion/HCAP S/P R thoracentesis yielding 1.1L of transudate fluid ABG showed PO2 of 65.7 on 2L of O2 CXR showed: Possible consolidation or atelectasis in the lung bases with small bilateral pleural effusions appears to be increasing. Repeat CXR on 07/15/17 post thoracentesis with no pneumothorax CT chest showed: Large right and moderate left pleural effusions with collapse/consolidation in the adjacent lower lobes ECHO with Pulm HTN: 43mmHg, EF 60-65% Cardiology consulted, will see pt in  am Continue IV lasix  Continuous O2, duonebs  Acute metabolic encephalopathy Resolved likely 2/2 hypoxia Afebrile, no leukocytosis ?? Aspiration PNA Procalcitonin <0.10, LA WNL BC X 2: NGTD UA negative, repeat UC with 20,000 E.coli Continue to hold off antibiotics  AKI Resolved BLE edema (at baseline) Vs cardiorenal Determining vol status quite tricky in this patient due to poor oral intake Strict I &O Daily BMP  Iron def anemia Hgb at baseline Continue PO iron supplement  Dysphagia SLP consulted, rec full liquid, nectar thick, nutritional supplements Aspiration precautions  Seizure disorder Continue depakote, levels WNL  PAF Continue Cardizem and toprol Continue eliquis  Hypothyroidism Continue synthroid  Chronic RLE wound Wound care consult  Mild dementia at baseline  Chronic steroid use (has been on it for years ??s/p knee surgery) Cont prednisone      Code Status: DNR   Family Communication: Wife at bedside  Disposition Plan: SNF   Consultants:  Cardiology   Procedures:  R thoracentesis on 07/15/17  Antimicrobials:  None  DVT prophylaxis:  Apixiban   Objective: Vitals:   07/16/17 1404 07/16/17 1448 07/16/17 2030 07/16/17 2032  BP: (!) 105/53  117/73   Pulse: 84  89   Resp: 18  18   Temp: 98.6 F (37 C)  98.1 F (36.7 C)   TempSrc: Oral  Oral   SpO2: 99% 99% 100% 96%    Intake/Output Summary (Last 24 hours) at 07/16/2017 2149 Last data filed at 07/16/2017 1700 Gross per 24 hour  Intake 300 ml  Output 2150 ml  Net -1850 ml   There were no vitals filed for this visit.  Exam:   General:  Awake, alert,  increased WOB  Cardiovascular: S1, S2 irregular   Respiratory: Mild crackles heard at the b/l bases  Abdomen: soft, NT, ND, BS present  Musculoskeletal: 1+ BLE pitting edema, with ace wrap  Skin: Wound on RLE with ace wrap  Psychiatry: Normal    Data Reviewed: CBC: Recent Labs  Lab 07/12/17 0624 07/13/17 0557  07/14/17 0526 07/15/17 0545 07/16/17 0621  WBC 7.1 7.9 8.3 5.8 6.4  NEUTROABS 5.2 6.3 6.6 4.3 4.7  HGB 9.2* 9.3* 8.8* 9.0* 9.1*  HCT 29.2* 29.2* 27.6* 29.2* 29.7*  MCV 97.3 96.7 96.5 97.7 97.4  PLT 103* 115* 132* 145* 350   Basic Metabolic Panel: Recent Labs  Lab 07/11/17 0831 07/12/17 0624 07/13/17 0557 07/14/17 0526 07/15/17 0545 07/16/17 0621  NA  --  139 138 141 142 139  K  --  4.0 3.8 3.6 3.7 3.9  CL  --  106 103 107 107 105  CO2  --  23 23 25 26 24   GLUCOSE  --  86 89 81 87 78  BUN  --  29* 30* 23* 20 23*  CREATININE  --  1.56* 1.30* 1.17 1.07 1.18  CALCIUM  --  7.8* 7.6* 7.8* 8.2* 8.3*  MG 1.7  --   --   --   --   --    GFR: Estimated Creatinine Clearance: 50.7 mL/min (by C-G formula based on SCr of 1.18 mg/dL). Liver Function Tests: Recent Labs  Lab 07/11/17 0831  AST 16  ALT 14*  ALKPHOS 45  BILITOT 0.9  PROT 5.5*  ALBUMIN 3.1*   No results for input(s): LIPASE, AMYLASE in the last 168 hours. No results for input(s): AMMONIA in the last 168 hours. Coagulation Profile: No results for input(s): INR, PROTIME in the last 168 hours. Cardiac Enzymes: No results for input(s): CKTOTAL, CKMB, CKMBINDEX, TROPONINI in the last 168 hours. BNP (last 3 results) No results for input(s): PROBNP in the last 8760 hours. HbA1C: No results for input(s): HGBA1C in the last 72 hours. CBG: No results for input(s): GLUCAP in the last 168 hours. Lipid Profile: No results for input(s): CHOL, HDL, LDLCALC, TRIG, CHOLHDL, LDLDIRECT in the last 72 hours. Thyroid Function Tests: No results for input(s): TSH, T4TOTAL, FREET4, T3FREE, THYROIDAB in the last 72 hours. Anemia Panel: No results for input(s): VITAMINB12, FOLATE, FERRITIN, TIBC, IRON, RETICCTPCT in the last 72 hours. Urine analysis:    Component Value Date/Time   COLORURINE YELLOW 07/11/2017 0346   APPEARANCEUR CLEAR 07/11/2017 0346   LABSPEC 1.015 07/11/2017 0346   PHURINE 5.0 07/11/2017 0346   GLUCOSEU  NEGATIVE 07/11/2017 0346   HGBUR NEGATIVE 07/11/2017 0346   BILIRUBINUR NEGATIVE 07/11/2017 0346   KETONESUR 5 (A) 07/11/2017 0346   PROTEINUR NEGATIVE 07/11/2017 0346   UROBILINOGEN 1.0 09/16/2007 1212   NITRITE NEGATIVE 07/11/2017 0346   LEUKOCYTESUR NEGATIVE 07/11/2017 0346   Sepsis Labs: @LABRCNTIP (procalcitonin:4,lacticidven:4)  ) Recent Results (from the past 240 hour(s))  Culture, blood (routine x 2) Call MD if unable to obtain prior to antibiotics being given     Status: None   Collection Time: 07/11/17  8:31 AM  Result Value Ref Range Status   Specimen Description   Final    BLOOD RIGHT ANTECUBITAL Performed at [facility], [place] [address] [place], [place] [address]    Special Requests   Final    BOTTLES DRAWN AEROBIC ONLY Blood Culture adequate volume Performed at [facility] [address] [place], [place] [address]  Culture   Final    NO GROWTH 5 DAYS Performed at [facility], [place] [address], [place], [place] [address]    Report Status 07/16/2017 FINAL  Final  Culture, blood (routine x 2) Call MD if unable to obtain prior to antibiotics being given     Status: None   Collection Time: 07/11/17  8:43 AM  Result Value Ref Range Status   Specimen Description   Final    BLOOD LEFT HAND Performed at [place] [address] [place], [place] [address]    Special Requests   Final    BOTTLES DRAWN AEROBIC AND ANAEROBIC Blood Culture adequate volume Performed at [facility] [address], [place], [place] [address]    Culture   Final    NO GROWTH 5 DAYS Performed at [facility], [name] [address], [place], [place] [address]    Report Status 07/16/2017 FINAL  Final  Culture, Urine     Status: Abnormal   Collection Time: 07/11/17  4:34 PM  Result Value Ref Range Status   Specimen Description   Final    URINE, RANDOM Performed at [facility] [address], [place], [place] [address]    Culture MULTIPLE SPECIES PRESENT, SUGGEST RECOLLECTION (A)  Final   Report Status 07/12/2017 FINAL  Final  Culture, Urine     Status: Abnormal   Collection Time: 07/13/17 12:30 AM  Result Value Ref Range Status   Specimen Description   Final    URINE, RANDOM Performed at [facility] [address], [place], [place] [address]    Special Requests   Final    NONE Performed at [facility], [place] [address], [place], [place] [address]    Culture 20,000 COLONIES/mL ESCHERICHIA COLI (A)  Final   Report Status 07/15/2017 FINAL  Final   Organism ID, Bacteria ESCHERICHIA COLI (A)  Final      Susceptibility   Escherichia coli - MIC*    AMPICILLIN <=2 SENSITIVE Sensitive     CEFAZOLIN <=4 SENSITIVE Sensitive     CEFTRIAXONE <=1 SENSITIVE Sensitive     CIPROFLOXACIN <=0.25 SENSITIVE Sensitive     GENTAMICIN <=1 SENSITIVE Sensitive     IMIPENEM <=0.25 SENSITIVE Sensitive     NITROFURANTOIN <=16 SENSITIVE Sensitive     TRIMETH/SULFA <=20 SENSITIVE Sensitive     AMPICILLIN/SULBACTAM <=2 SENSITIVE Sensitive     PIP/TAZO <=4 SENSITIVE Sensitive     Extended ESBL NEGATIVE Sensitive     * 20,000 COLONIES/mL ESCHERICHIA COLI  Culture, body fluid-bottle     Status: None (Preliminary result)   Collection Time: 07/15/17 12:40 PM  Result Value Ref Range Status   Specimen Description PLEURAL  Final   Special Requests RIGHT  Final   Culture   Final    NO GROWTH < 24 HOURS Performed at [facility], [address] [place], [place] [address]    Report Status PENDING  Incomplete  Gram stain     Status: None   Collection Time: 07/15/17 12:40 PM  Result Value Ref Range Status   Specimen Description PLEURAL  Final   Special Requests RIGHT  Final   Gram Stain   Final    WBC PRESENT,BOTH PMN AND MONONUCLEAR NO ORGANISMS SEEN Performed at [facility], [address], [place], [place] [address]    Report  Status 07/15/2017 FINAL  Final      Studies:  No results found.  Scheduled Meds: . apixaban  5 mg Oral BID  . calcium carbonate  1,250 mg Oral BID WC  . cholecalciferol  1,000 Units Oral BID  . collagenase   Topical Daily  . diltiazem  300 mg Oral Daily  . divalproex  250 mg Oral Daily  . divalproex  750 mg Oral QHS  . doxazosin  4 mg Oral Daily  . ezetimibe  5 mg Oral QHS  . feeding supplement  1 Container Oral TID BM  . feeding supplement (ENSURE ENLIVE)  237 mL Oral TID BM  . feeding supplement (PRO-STAT SUGAR FREE 64)  30 mL Oral BID  . ferrous sulfate  325 mg Oral BID WC  . fluticasone  1 spray Each Nare Daily  . furosemide  40 mg Intravenous Daily  . gabapentin  300 mg Oral BID  . ipratropium-albuterol  3 mL Nebulization TID  . levothyroxine  150 mcg Oral QAC breakfast  . lidocaine  1 patch Transdermal Q24H  . metoprolol tartrate  25 mg Oral BID  . pantoprazole  40 mg Oral Daily  . pravastatin  40 mg Oral QHS  . predniSONE  10 mg Oral Q breakfast  . vitamin B-12  500 mcg Oral QHS    Continuous Infusions:    LOS: 5 days     Alma Friendly, MD [facility]   If 7PM-7AM, please contact night-coverage [email or web address] Password [facility] 07/16/2017, 9:49 PM

## 2017-07-16 NOTE — Clinical Social Work Note (Signed)
Clinical Social Work Assessment  Patient Details  Name: [name] MRN: [id number] Date of Birth: [birth year]  Date of referral:  07/16/17               Reason for consult:  Facility Placement                Permission sought to share information with:  Case Manager, Customer service manager, Family Supports Permission granted to share information::  Yes, Verbal Permission Granted  Name::     Actuary::  SNF  Relationship::  Spouse  Contact Information:     Housing/Transportation Living arrangements for the past 2 months:  Single Family Home Source of Information:  Patient Patient Interpreter Needed:  None Criminal Activity/Legal Involvement Pertinent to Current Situation/Hospitalization:  No - Comment as needed Significant Relationships:  Spouse Lives with:  Spouse Do you feel safe going back to the place where you live?  Yes Need for family participation in patient care:  Yes (Comment)  Care giving concerns:  Pt lives at home with wife- wife does not feel pt would be manageable at home with current level of physical needs and current mental status.   Social Worker assessment / plan:  LCSW consulted for SNF placement.   LCSW met at bedside with patient. Patient was assessed and placed in SNF in February. No significant changes since last assessement.   CSW spoke with pt and pt wife concerning PT recommendation for SNF.  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  CSW discussed PT recommendation of SNF with pt wife.  Wife reports pt has been to SNF in the past.  Wife very concerned with SNF caring for pt wounds- states pt goes to [facility] every 1-2 weeks to be check on but that at home she changes dressings q2 days or PRN if high drainage.  PLAN: TBD Patient's wife is trying to determine [name] vs SNF.   Employment status:  Retired Forensic scientist:   Medicare PT Recommendations:  [place] / Referral to community resources:     Patient/Family's Response to care:  Spouse is proactive in patient care. Spouse is thankful for LCSW visit and services.   Patient/Family's Understanding of and Emotional Response to Diagnosis, Current Treatment, and Prognosis:  Patient is not alert and oriented. Spouse, [name] expressed concerns about past experience in SNF.  Spouse is apprehensive to SNF and states patient does not want to go. Spouse is realistic about patients needs and level of care and states that SNF may be the best option.   Emotional Assessment Appearance:  Appears stated age Attitude/Demeanor/Rapport:    Affect (typically observed):  Calm Orientation:  Oriented to Self, Oriented to Place Alcohol / Substance use:  Not Applicable Psych involvement (Current and /or in the community):  No (Comment)  Discharge Needs  Concerns to be addressed:  No discharge needs identified Readmission within the last 30 days:  Yes Current discharge risk:  None Barriers to Discharge:  No SNF bed, Continued Medical Work up   [employer], LCSW 07/16/2017, 3:06 PM

## 2017-07-16 NOTE — Progress Notes (Signed)
  Speech Language Pathology Treatment:    Patient Details Name: [name] MRN: [id number] DOB: 12/24/1933 Today's Date: 07/16/2017 Time: 6160-7371 SLP Time Calculation (min) (ACUTE ONLY): 35 min  Assessment / Plan / Recommendation Clinical Impression  Pt seen to assess po tolerance and readiness for dietary advancement.  Pt not observed with wheeze today with po intake. Cues to cease talking with solids in mouth needed- pt tends to want to talk while eating - max cues to focus on task effective.  Suspect pt eating with wife alone is more compliant.  No indications of aspiration with all po observed, pt work of breathing decreased today as he is s/p thoracentesis.    Wife denies pt coughing with intake over the last few days- suspect possible CHF cough rather than aspiration cough previously.    Reviewed prior MBS with pt/family and provided sign for aspiration precautions. Advised RN to dietary advancement.  Recommend to advance to dys3/ground meats/thin with precautions.  Will follow up for po tolerance, possible RMST training and family education.  Wife pt and pt very thankful for dietary advancement and pt's improved breathing.    HPI HPI: [name] is a 84 y.o. male with medical history significant of HTN, HLD, diastolic CHF, atrial fibrillation on chronic anticoagulation, seizures, hypothyroidism, dementia, chronic lower extremity wound, and GERD; who presents from rehab after being found to have fever with altered mental status.  Pt found to be septic secondary to pna vs wound infection. CXR shows Left lung base atelectasis versus infiltrate increased.  Pt also with h/o multiple spine surgeries- posterior C5-T1 2013 and C2-C5 01/2013.  Swallow eval reordered.  Advised to proceed with MBS since prior clinical evals negative and pt continues with admits for pna.  Pt has not had an MBS in the past and his dementia, and spinal surgeries may impact sensorimotor abilities.        SLP  Plan  Continue with current plan of care       Recommendations  Liquids provided via: Cup Medication Administration: Whole meds with puree(start and follow with liquids) Supervision: Patient able to self feed;Full supervision/cueing for compensatory strategies Compensations: Slow rate;Small sips/bites;Other (Comment)(start meals with liquids, drink liquids during meal, use thiackener if prevents pt from coughing) Postural Changes and/or Swallow Maneuvers: Seated upright 90 degrees;Upright 30-60 min after meal                Oral Care Recommendations: Oral care BID Follow up Recommendations: Skilled Nursing facility SLP Visit Diagnosis: Dysphagia, oropharyngeal phase (R13.12) Plan: Continue with current plan of care       GO               Luanna Salk, [facility] SLP [phone]  Macario Golds 07/16/2017, 5:23 PM

## 2017-07-16 NOTE — Progress Notes (Signed)
Occupational Therapy Treatment Patient Details Name: [name] MRN: [id number] DOB: Aug 04, 1933 Today's Date: 07/16/2017    History of present illness [name] is a 84 y.o. male with medical history significant for CAD, atrial fibrillation, seizure disorder, dementia, presented to the ED with lethargy. Pt with multiple admission for sepsis 2/2 recurrent aspiration pneumonia, about 3 times in the past couple of months, last admission from 4/22-4/26. Since discharge he has progressively become more lethargic and generally weak at home, unable to assist with his own care over past couple of days.    OT comments  Pt needs significant A and will need post acute rehab.  Follow Up Recommendations  SNF    Equipment Recommendations  None recommended by OT    Recommendations for Other Services      Precautions / Restrictions Precautions Precautions: Fall Restrictions Weight Bearing Restrictions: No       Mobility Bed Mobility Overal bed mobility: Needs Assistance Bed Mobility: Supine to Sit     Supine to sit: Mod assist        Transfers Overall transfer level: Needs assistance Equipment used: Rolling walker (2 wheeled) Transfers: Sit to/from [employer] Transfers Sit to Stand: Max assist;+2 physical assistance;+2 safety/equipment Stand pivot transfers: Mod assist;Max assist;+2 physical assistance;+2 safety/equipment      Lateral/Scoot Transfers: Max assist;Mod assist;+2 physical assistance      Balance Overall balance assessment: Needs assistance Sitting-balance support: Feet supported;Bilateral upper extremity supported Sitting balance-Leahy Scale: Fair                                     ADL either performed or assessed with clinical judgement   ADL   Eating/Feeding: Minimal assistance;Sitting   Grooming: Minimal assistance;Bed level;Sitting                   Toilet Transfer: Maximal assistance;Stand-pivot;+2 for physical  assistance;+2 for safety/equipment;Cueing for sequencing;Cueing for safety(bed to chair)   Toileting- Clothing Manipulation and Hygiene: +2 for physical assistance;Sit to/from stand;Cueing for sequencing;Cueing for safety;Total assistance         General ADL Comments: Pt needed max A of 2 for transfer. Educated Therapist, sports and CNA on level of A needed for transfer.  Wife not able to provide this amount of A- pt will need SNF     Vision Patient Visual Report: No change from baseline            Cognition Arousal/Alertness: Awake/alert Behavior During Therapy: WFL for tasks assessed/performed Overall Cognitive Status: History of cognitive impairments - at baseline                                                     Pertinent Vitals/ Pain       Pain Assessment: No/denies pain         Frequency  Min 2X/week        Progress Toward Goals  OT Goals(current goals can now be found in the care plan section)  Progress towards OT goals: Progressing toward goals     Plan Discharge plan needs to be updated    Co-evaluation                 AM-PAC PT "6 Clicks" Daily Activity  Outcome Measure   Help from another person eating meals?: A Little Help from another person taking care of personal grooming?: A Little Help from another person toileting, which includes using toliet, bedpan, or urinal?: Total Help from another person bathing (including washing, rinsing, drying)?: A Lot Help from another person to put on and taking off regular upper body clothing?: A Lot Help from another person to put on and taking off regular lower body clothing?: Total 6 Click Score: 12    End of Session Equipment Utilized During Treatment: Gait belt  OT Visit Diagnosis: Muscle weakness (generalized) (M62.81)   Activity Tolerance Patient limited by fatigue   Patient Left in bed;with call bell/phone within reach;with bed alarm set;with family/visitor present   Nurse  Communication Mobility status        Time: 9295-7473 OT Time Calculation (min): 15 min  Charges: OT General Charges $OT Visit: 1 Visit OT Treatments $Self Care/Home Management : 8-22 mins  [place], [place]   Betsy Pries 07/16/2017, 1:33 PM

## 2017-07-16 NOTE — NC FL2 (Addendum)
[place] LEVEL OF CARE SCREENING TOOL     IDENTIFICATION  Patient Name: [name] Birthdate: [birth year] Sex: male Admission Date (Current Location): 07/11/2017  [facility] and [place] Number:  Herbalist and Address:  [facility],  [address] [place], [place]      Provider Number: [id number]  Attending Physician Name and Address:  Alma Friendly, MD  Relative Name and Phone Number:       Current Level of Care: Hospital Recommended Level of Care: [place] Prior Approval Number:    Date Approved/Denied: 07/16/17 PASRR Number: [id number]  Discharge Plan: SNF    Current Diagnoses: Patient Active Problem List   Diagnosis Date Noted  . Aspiration syndrome, subsequent encounter 07/11/2017  . Aspiration pneumonia ([place]) 07/01/2017  . Acute encephalopathy 07/01/2017  . Thrombocytopenia ([place]) 07/01/2017  . Chronic diastolic CHF (congestive heart failure) ([place]) 07/01/2017  . Anemia of chronic disease 07/01/2017  . Troponin level elevated 07/01/2017  . PAF (paroxysmal atrial fibrillation) ([place]) 07/01/2017  . Goals of care, counseling/discussion   . Palliative care by specialist   . Acute metabolic encephalopathy 85/04/7739  . CKD (chronic kidney disease) stage 3, GFR 30-59 ml/min (HCC)   . CAP (community acquired pneumonia) 04/17/2017  . Atrial fibrillation with RVR ([place]) 04/17/2017  . Hallux rigidus, right foot 03/10/2016  . Lumbosacral spondylosis without myelopathy 10/29/2015  . Memory difficulty 09/22/2015  . Abnormality of gait 09/22/2015  . Hyperglycemia 07/06/2015  . Chronic pain 07/06/2015  . Chronic insomnia 03/30/2015  . Transient alteration of awareness 03/30/2015  . Abnormal liver function   . Altered mental status 01/31/2015  . Essential hypertension 01/31/2015  . Constipation 01/31/2015  . Hypothyroidism 01/31/2015  . Seizure disorder ([place]) 01/31/2015  . Bladder outlet obstruction  01/31/2015  . GERD (gastroesophageal reflux disease) 01/31/2015  . Chronic back pain 01/31/2015  . Acute kidney injury ([place]) 01/31/2015    Orientation RESPIRATION BLADDER Height & Weight     Self, Place  O2 Continent Weight:   Height:     BEHAVIORAL SYMPTOMS/MOOD NEUROLOGICAL BOWEL NUTRITION STATUS      Continent Diet(See dc summary)  AMBULATORY STATUS COMMUNICATION OF NEEDS Skin   Extensive Assist Verbally   WOC Nurse wound consult note Reason for Consult: Areas of discoloration to left lower leg; "worsening wounds" to the right lower leg/ inner ankle per patient's spouse. After examining the right lower leg/inner ankle areas and interviewing the patient and his spouse, it was determined that what the spouse was concerned about regarding these areas was an area that had started to weep.  I placed a piece of silver hydrofiber over this area and redressed the wound per the orders entered by Letitia Caul on 07/11/17.  I agree with the plan for these sites.  The left lower leg, inner aspect, has a grey/yellow discolored area (epithelial) tissue is intact.  The site measures 1 cm x 2.3 cm x 0 depth.  The etiology of the discoloration is unclear.  The right achilles tendon area has a purple area of discoloration that measures 3 cm x 2 cm x 0 depth.  The presentation of both of these areas is consistent with DTIs.  The spouse reports that on Thursday a nurse for the patient (while roomed in 1516) wrapped the left foot and lower leg with an ACE wrap.  The plan of care for these areas is betadine to the areas of discoloration; allow to air dry; spiral wrap  kerlex from just behind the toes to above the lower leg discoloration.  Change daily.  Also, to use Prevalon boots bilaterally at all times the patient is in the bed.  I further recommend that the medical team consider having a venous study to evaluate the veins and valves in both legs.  This would help [place] determine if venous insufficiency is contributing  to extremity edema, which I understand has been a problem for the patient in the past.  Monitor the wound area(s) for worsening of condition such as: Signs/symptoms of infection,  Increase in size,  Development of or worsening of odor, Development of pain, or increased pain at the affected locations.  Notify the medical team if any of these develop.                         Personal Care Assistance Level of Assistance  Bathing, Feeding, Dressing Bathing Assistance: Maximum assistance Feeding assistance: Limited assistance Dressing Assistance: Maximum assistance     Functional Limitations Info  Sight, Hearing, Speech Sight Info: Impaired(Wears glasses) Hearing Info: Adequate Speech Info: Adequate    SPECIAL CARE FACTORS FREQUENCY  PT (By licensed PT), OT (By licensed OT)     PT Frequency: 5x/week OT Frequency: 5x/week            Contractures Contractures Info: Not present    Additional Factors Info  Code Status, Allergies Code Status Info: DNR Allergies Info: Demerol Meperidine, Keppra Levetiracetam           Current Medications (07/16/2017):  This is the current hospital active medication list Current Facility-Administered Medications  Medication Dose Route Frequency Provider Last Rate Last Dose  . acetaminophen (TYLENOL) tablet 650 mg  650 mg Oral Q4H PRN [name], DO   650 mg at 07/11/17 1733  . apixaban (ELIQUIS) tablet 5 mg  5 mg Oral BID Minda Ditto, RPH   5 mg at 07/16/17 0900  . calcium carbonate (OS-CAL - dosed in mg of elemental calcium) tablet 1,250 mg  1,250 mg Oral BID WC Jennette Kettle M, DO   1,250 mg at 07/16/17 4656  . cholecalciferol (VITAMIN D) tablet 1,000 Units  1,000 Units Oral BID Alma Friendly, MD   1,000 Units at 07/16/17 0900  . collagenase (SANTYL) ointment   Topical Daily Jennette Kettle M, DO      . diltiazem (CARDIZEM CD) 24 hr capsule 300 mg  300 mg Oral Daily Jennette Kettle M, DO   300 mg at 07/16/17 0900  .  divalproex (DEPAKOTE) DR tablet 250 mg  250 mg Oral Daily Jennette Kettle M, DO   250 mg at 07/16/17 0900  . divalproex (DEPAKOTE) DR tablet 750 mg  750 mg Oral QHS Alma Friendly, MD   750 mg at 07/15/17 2209  . doxazosin (CARDURA) tablet 4 mg  4 mg Oral Daily Jennette Kettle M, DO   4 mg at 07/16/17 0900  . ezetimibe (ZETIA) tablet 5 mg  5 mg Oral QHS Jennette Kettle M, DO   5 mg at 07/15/17 2209  . feeding supplement (BOOST / RESOURCE BREEZE) liquid 1 Container  1 Container Oral TID BM Alma Friendly, MD   1 Container at 07/13/17 2153  . feeding supplement (ENSURE ENLIVE) (ENSURE ENLIVE) liquid 237 mL  237 mL Oral TID BM [name], DO   237 mL at 07/16/17 0908  . feeding supplement (PRO-STAT SUGAR FREE 64) liquid 30 mL  30  mL Oral BID Jennette Kettle M, DO   30 mL at 07/16/17 0900  . ferrous sulfate tablet 325 mg  325 mg Oral BID WC Jennette Kettle M, DO   325 mg at 07/16/17 0734  . fluticasone (FLONASE) 50 MCG/ACT nasal spray 1 spray  1 spray Each Nare Daily Alma Friendly, MD   1 spray at 07/16/17 0901  . furosemide (LASIX) injection 40 mg  40 mg Intravenous Daily Alma Friendly, MD   40 mg at 07/16/17 0900  . gabapentin (NEURONTIN) capsule 300 mg  300 mg Oral BID Jennette Kettle M, DO   300 mg at 07/16/17 0900  . HYDROcodone-acetaminophen (NORCO) 10-325 MG per tablet 1 tablet  1 tablet Oral Q6H PRN [name], DO   1 tablet at 07/16/17 0737  . ipratropium-albuterol (DUONEB) 0.5-2.5 (3) MG/3ML nebulizer solution 3 mL  3 mL Nebulization TID Alma Friendly, MD   3 mL at 07/16/17 0956  . levothyroxine (SYNTHROID, LEVOTHROID) tablet 150 mcg  150 mcg Oral QAC breakfast [name], DO   150 mcg at 07/16/17 0734  . lidocaine (LIDODERM) 5 % 1 patch  1 patch Transdermal Q24H Alma Friendly, MD   1 patch at 07/15/17 1612  . metoprolol tartrate (LOPRESSOR) tablet 25 mg  25 mg Oral BID Jennette Kettle M, DO   25 mg at 07/16/17 0900  . pantoprazole (PROTONIX)  EC tablet 40 mg  40 mg Oral Daily Jennette Kettle M, DO   40 mg at 07/16/17 0900  . pravastatin (PRAVACHOL) tablet 40 mg  40 mg Oral QHS Jennette Kettle M, DO   40 mg at 07/15/17 2215  . predniSONE (DELTASONE) tablet 10 mg  10 mg Oral Q breakfast [name], DO   10 mg at 07/16/17 3810  . [place]   Oral PRN Alma Friendly, MD      . vitamin B-12 (CYANOCOBALAMIN) tablet 500 mcg  500 mcg Oral QHS Jennette Kettle M, DO   500 mcg at 07/15/17 2209     Discharge Medications: Please see discharge summary for a list of discharge medications.  Relevant Imaging Results:  Relevant Lab Results:   Additional Information SS#: [id number]  Servando Snare, LCSW

## 2017-07-16 NOTE — Care Management Important Message (Signed)
Important Message  Patient Details  Name: [name] MRN: [id number] Date of Birth: 1934/02/04   Medicare Important Message Given:  Yes    Kerin Salen 07/16/2017, 12:43 [place] Message  Patient Details  Name: [name] MRN: [id number] Date of Birth: 1933-08-21   Medicare Important Message Given:  Yes    Kerin Salen 07/16/2017, 12:43 PM

## 2017-07-17 DIAGNOSIS — I5033 Acute on chronic diastolic (congestive) heart failure: Secondary | ICD-10-CM

## 2017-07-17 DIAGNOSIS — N183 Chronic kidney disease, stage 3 (moderate): Secondary | ICD-10-CM

## 2017-07-17 DIAGNOSIS — J9601 Acute respiratory failure with hypoxia: Secondary | ICD-10-CM

## 2017-07-17 DIAGNOSIS — I10 Essential (primary) hypertension: Secondary | ICD-10-CM

## 2017-07-17 DIAGNOSIS — I480 Paroxysmal atrial fibrillation: Secondary | ICD-10-CM

## 2017-07-17 LAB — CBC WITH DIFFERENTIAL/PLATELET
Basophils Absolute: 0 10*3/uL (ref 0.0–0.1)
Basophils Relative: 0 %
Eosinophils Absolute: 0.1 10*3/uL (ref 0.0–0.7)
Eosinophils Relative: 1 %
HCT: 29.4 % — ABNORMAL LOW (ref 39.0–52.0)
Hemoglobin: 9.1 g/dL — ABNORMAL LOW (ref 13.0–17.0)
Lymphocytes Relative: 15 %
Lymphs Abs: 1.1 10*3/uL (ref 0.7–4.0)
MCH: 29.9 pg (ref 26.0–34.0)
MCHC: 31 g/dL (ref 30.0–36.0)
MCV: 96.7 fL (ref 78.0–100.0)
Monocytes Absolute: 0.8 10*3/uL (ref 0.1–1.0)
Monocytes Relative: 11 %
Neutro Abs: 5.4 10*3/uL (ref 1.7–7.7)
Neutrophils Relative %: 73 %
Platelets: 148 10*3/uL — ABNORMAL LOW (ref 150–400)
RBC: 3.04 MIL/uL — ABNORMAL LOW (ref 4.22–5.81)
RDW: 14.8 % (ref 11.5–15.5)
WBC: 7.4 10*3/uL (ref 4.0–10.5)

## 2017-07-17 LAB — BASIC METABOLIC PANEL
Anion gap: 12 (ref 5–15)
BUN: 31 mg/dL — ABNORMAL HIGH (ref 6–20)
CO2: 27 mmol/L (ref 22–32)
Calcium: 8.4 mg/dL — ABNORMAL LOW (ref 8.9–10.3)
Chloride: 103 mmol/L (ref 101–111)
Creatinine, Ser: 1.37 mg/dL — ABNORMAL HIGH (ref 0.61–1.24)
GFR calc Af Amer: 54 mL/min — ABNORMAL LOW (ref >60)
GFR calc non Af Amer: 47 mL/min — ABNORMAL LOW (ref >60)
Glucose, Bld: 82 mg/dL (ref 65–99)
Potassium: 3.7 mmol/L (ref 3.5–5.1)
Sodium: 142 mmol/L (ref 135–145)

## 2017-07-17 MED ORDER — FUROSEMIDE 40 MG PO TABS
40.0000 mg | ORAL_TABLET | Freq: Every day | ORAL | Status: DC
  Administered 2017-07-18 – 2017-07-19 (×2): 40 mg via ORAL
  Filled 2017-07-17 (×2): qty 1

## 2017-07-17 MED ORDER — DILTIAZEM HCL ER COATED BEADS 180 MG PO CP24
300.0000 mg | ORAL_CAPSULE | Freq: Every day | ORAL | Status: DC
  Administered 2017-07-17 – 2017-07-19 (×3): 300 mg via ORAL
  Filled 2017-07-17 (×3): qty 1

## 2017-07-17 NOTE — Consult Note (Addendum)
Cardiology Consultation:   Patient ID: [name]; [id number]; Mar 16, 1933   Admit date: 07/11/2017 Date of Consult: 07/17/2017  Primary Care Provider: [name], MD Primary Cardiologist: Dr Oval Linsey 04/25/2017 in-hospital Primary Electrophysiologist:  n/a   Patient Profile:   [name] is a 84 y.o. male with a hx of RCA PCI 2002, HTN, HLD, GERD, OA, vertigo, chronic right leg wound, seizures, memory loss, D-CHF & atrial fib dx 04/2017 on Eliquis w/ CHA2DS2-VASc = 5 (age x 2, CAD, HTN, CHF), dementia, hypothyroid, OSA not on CPAP, who is being seen today for the evaluation of CHF, Pleural effusions at the request of Dr Horris Latino.  History of Present Illness:   [name] was seen by Dr Oval Linsey 04/12 and started on Lasix for volume overload, wt 189 lbs.   Admitted 04/22-04/26 for possible aspiration PNA, AKI (?2nd dehydration)>> IVF>>volume overload >>po Lasix>>hypernatremia (NA 149)/dehydration>> 1/2 NS w/ IV Lasix>>oral Lasix. Pt is DNR/DNI, Palliative Care saw. Home vs SNF>>Home  Admitted 05/02 with weakness, possible aspiration event, AMS, periodic hypoxia so may get home O2, AKI w/ BUN/Cr 29/1.56. IVF started and Lasix held. Pt became volume overloaded and pleural effusions developed.   Thoracentesis performed 05/06, 1.1 L transudate removed. Pt breathing better. PT eval rec SNF. Now on IV Lasix 40 mg daily, nebs. PO intake is poor. Dysphagia 3 w/ nectar thick liquids recommended by speech.   Patient and wife are in the room.  He is able to answer some questions.  They have decided on a rehab facility with the hopes that he will get strong enough to come home.  Currently, he is not able to stand without assistance.  He denies any awareness of the atrial fibrillation.  He never feels his heart beat irregularly and never is aware of it beating fast.  His breathing is much better since the thoracentesis.  His wife states that he does not drink liquids consistently.  His  p.o. intake is generally poor.  She makes sure that he is compliant with medications.  He was having increasing shortness of breath as well as orthopnea.  He has been on oxygen, no PND.   Past Medical History:  Diagnosis Date  . Abnormality of gait 09/22/2015  . Arthritis   . Atrial fibrillation ([place])   . CAD (coronary artery disease)    Stent to RCA, Penta stent, 99% reduced to 0% 2002.  . Cancer ([place])    skin CA removed from back  . Chronic insomnia 03/30/2015  . Complication of anesthesia    trouble waking up  . Constipation    from medications taken  . GERD (gastroesophageal reflux disease)   . Hypercholesteremia   . Hypertension   . Hypothyroidism   . Memory difficulty 09/22/2015  . Osteoarthritis   . Seizures ([place])   . Sepsis ([place]) 05/2017  . Transient alteration of awareness 03/30/2015  . Vertigo    hx of    Past Surgical History:  Procedure Laterality Date  . BACK SURGERY    . EYE SURGERY     Bilateral Cataract surgery   . HERNIA REPAIR    . I&D EXTREMITY Right 05/10/2015   Procedure: IRRIGATION AND DEBRIDEMENT EXTREMITY;  Surgeon: Leanora Cover, MD;  Location: [facility];  Service: Orthopedics;  Laterality: Right;  . KNEE ARTHROPLASTY     right knee X 2; left knee once  . LAMINECTOMY     X 6  . POSTERIOR CERVICAL FUSION/FORAMINOTOMY  01/28/2012   Procedure:  POSTERIOR CERVICAL FUSION/FORAMINOTOMY LEVEL 3;  Surgeon: [name], MD;  Location: [facility];  Service: Neurosurgery;  Laterality: Left;  Posterior Cervical Five-Thoracic One Fusion, Arthrodesis with LEFT Cervical Seven-thoracic One Laminectomy, Foraminotomy and Resection of Synovial Cyst  . POSTERIOR CERVICAL FUSION/FORAMINOTOMY N/A 01/29/2013   Procedure: POSTERIOR CERVICAL FUSION/FORAMINOTOMY LEVEL 1 and C2-5 Posteriolateral Arthrodesis;  Surgeon: [name], MD;  Location: [facility];  Service: Neurosurgery;  Laterality: N/A;  C2-C3 Laminectomy C2-C3 posterior cervical  arthrodesis  . TONSILLECTOMY       Prior to Admission medications   Medication Sig Start Date End Date Taking? Authorizing Provider  acetaminophen (TYLENOL) 325 MG tablet Take 650 mg by mouth every 4 (four) hours as needed for mild pain.   Yes [provider]  alendronate (FOSAMAX) 70 MG tablet Take 70 mg by mouth once a week.  01/06/12  Yes [provider]  Amino Acids-Protein Hydrolys (FEEDING SUPPLEMENT, PRO-STAT SUGAR FREE 64,) LIQD Take 30 mLs by mouth 2 (two) times daily. 07/05/17  Yes Lavina Hamman, MD  apixaban (ELIQUIS) 5 MG TABS tablet Take 1 tablet (5 mg total) by mouth 2 (two) times daily. 06/28/17  Yes Skeet Latch, MD  calcium carbonate (OS-CAL - DOSED IN MG OF ELEMENTAL CALCIUM) 1250 (500 Ca) MG tablet Take 1 tablet (1,250 mg total) by mouth 2 (two) times daily with a meal. 06/01/17  Yes Mikhail, Velta Addison, DO  Cholecalciferol (VITAMIN D-3) 1000 units CAPS Take 1,000 Units by mouth 2 (two) times daily.   Yes [provider]  Coenzyme Q10 (COQ10) 200 MG CAPS Take 200 mg by mouth at bedtime.   Yes [provider]  collagenase (SANTYL) ointment Apply topically daily. 06/02/17  Yes Mikhail, Maryann, DO  cyanocobalamin 500 MCG tablet Take 500 mcg by mouth at bedtime.   Yes [provider]  diltiazem (CARDIZEM CD) 300 MG 24 hr capsule Take 1 capsule (300 mg total) by mouth daily. 06/28/17  Yes Skeet Latch, MD  divalproex (DEPAKOTE) 250 MG DR tablet Take 3 tablets (750 mg total) by mouth every 12 (twelve) hours. Patient taking differently: Take 250-750 mg by mouth 2 (two) times daily. Take 1 tablet (250 mg) in the morning and Take 3 tablets (750 mg) in the evening. 07/09/15  Yes Robbie Lis, MD  docusate sodium (COLACE) 100 MG capsule Take 200 mg by mouth 2 (two) times daily.    Yes [provider]  doxazosin (CARDURA) 4 MG tablet Take 1 tablet (4 mg total) by mouth daily. 06/28/17  Yes Skeet Latch, MD  feeding supplement,  ENSURE ENLIVE, (ENSURE ENLIVE) LIQD Take 237 mLs by mouth 3 (three) times daily between meals. 06/01/17  Yes Mikhail, Lashmeet, DO  ferrous sulfate 325 (65 FE) MG tablet Take 1 tablet (325 mg total) by mouth 2 (two) times daily with a meal. 06/28/17  Yes Skeet Latch, MD  furosemide (LASIX) 20 MG tablet Take 1 tablet (20 mg total) by mouth daily. 07/05/17  Yes Lavina Hamman, MD  gabapentin (NEURONTIN) 300 MG capsule Take 300 mg by mouth 2 (two) times daily.   Yes [provider]  HYDROcodone-acetaminophen (NORCO) 10-325 MG tablet Take 1 tablet by mouth every 6 (six) hours as needed for moderate pain. 04/25/17  Yes Lavina Hamman, MD  levothyroxine (SYNTHROID, LEVOTHROID) 150 MCG tablet Take 150 mcg by mouth daily before breakfast. 12/27/11  Yes [provider]  lidocaine (LIDODERM) 5 % Place 1 patch onto the skin daily as  needed. Remove & Discard patch within 12 hours or as directed by MD 06/01/17  Yes Mikhail, Velta Addison, DO  metoprolol tartrate (LOPRESSOR) 25 MG tablet Take 1 tablet (25 mg total) by mouth 2 (two) times daily. 06/28/17  Yes Skeet Latch, MD  Multiple Vitamin (MULTIVITAMIN WITH MINERALS) TABS tablet Take 1 tablet by mouth at bedtime.   Yes [provider]  nitroGLYCERIN (NITROSTAT) 0.4 MG SL tablet Place 0.4 mg under the tongue every 5 (five) minutes as needed for chest pain. Reported on 07/20/2015   Yes [provider]  omeprazole (PRILOSEC) 20 MG capsule Take 20 mg by mouth 2 (two) times daily before a meal.  12/23/11  Yes [provider]  pravastatin (PRAVACHOL) 40 MG tablet Take 40 mg by mouth at bedtime.  10/29/14  Yes [provider]  predniSONE (DELTASONE) 10 MG tablet Take 1 tablet (10 mg total) by mouth daily with breakfast. 04/28/17  Yes Lavina Hamman, MD  senna (SENOKOT) 8.6 MG tablet Take 1 tablet by mouth 2 (two) times daily.    Yes [provider]  vitamin A 8000 UNIT capsule Take 8,000 Units by mouth 2  (two) times daily.   Yes [provider]  vitamin C (ASCORBIC ACID) 500 MG tablet Take 500 mg by mouth every morning.   Yes [provider]  ZETIA 10 MG tablet Take 5 mg by mouth at bedtime.  10/15/11  Yes [provider]  zinc sulfate 220 (50 Zn) MG capsule Take 1 capsule (220 mg total) by mouth every morning. 06/02/17  Yes Cristal Ford, DO    Inpatient Medications: Scheduled Meds: . apixaban  5 mg Oral BID  . calcium carbonate  1,250 mg Oral BID WC  . cholecalciferol  1,000 Units Oral BID  . collagenase   Topical Daily  . diltiazem  300 mg Oral Daily  . divalproex  250 mg Oral Daily  . divalproex  750 mg Oral QHS  . doxazosin  4 mg Oral Daily  . ezetimibe  5 mg Oral QHS  . feeding supplement  1 Container Oral TID BM  . feeding supplement (ENSURE ENLIVE)  237 mL Oral TID BM  . feeding supplement (PRO-STAT SUGAR FREE 64)  30 mL Oral BID  . ferrous sulfate  325 mg Oral BID WC  . fluticasone  1 spray Each Nare Daily  . furosemide  40 mg Intravenous Daily  . gabapentin  300 mg Oral BID  . ipratropium-albuterol  3 mL Nebulization TID  . levothyroxine  150 mcg Oral QAC breakfast  . lidocaine  1 patch Transdermal Q24H  . metoprolol tartrate  25 mg Oral BID  . pantoprazole  40 mg Oral Daily  . pravastatin  40 mg Oral QHS  . predniSONE  10 mg Oral Q breakfast  . vitamin B-12  500 mcg Oral QHS   Continuous Infusions:  PRN Meds: acetaminophen, HYDROcodone-acetaminophen, RESOURCE THICKENUP CLEAR  Allergies:    Allergies  Allergen Reactions  . Demerol [Meperidine] Nausea And Vomiting  . Keppra [Levetiracetam] Other (See Comments)    Causes sleepiness    Social History:   Social History   Socioeconomic History  . Marital status: Married    Spouse name: [name]  . Number of children: 3  . Years of education: 25  . Highest education level: Not on file  Occupational History  . Occupation: retired Software engineer  Social Needs  . Financial resource  strain: Not on file  . Food insecurity:  Worry: Not on file    Inability: Not on file  . Transportation needs:    Medical: Not on file    Non-medical: Not on file  Tobacco Use  . Smoking status: Former Research scientist (life sciences)  . Smokeless tobacco: Never Used  Substance and Sexual Activity  . Alcohol use: Yes    Comment: rare  . Drug use: No  . Sexual activity: Not on file  Lifestyle  . Physical activity:    Days per week: Not on file    Minutes per session: Not on file  . Stress: Not on file  Relationships  . Social connections:    Talks on phone: Not on file    Gets together: Not on file    Attends religious service: Not on file    Active member of club or organization: Not on file    Attends meetings of clubs or organizations: Not on file    Relationship status: Not on file  . Intimate partner violence:    Fear of current or ex partner: Not on file    Emotionally abused: Not on file    Physically abused: Not on file    Forced sexual activity: Not on file  Other Topics Concern  . Not on file  Social History Narrative   Lives at home w/ his wife [name]   Patient drinks 4-5 cups of coffee daily.   Patient is right handed.     Family History:   Family History  Problem Relation Age of Onset  . Hypertension Mother   . Cancer Mother   . Kidney failure Father   . Heart disease Father    Family Status:  Family Status  Relation Name Status  . Mother  Deceased  . Father  Deceased  . Sister  Alive  . Brother  Deceased       Murdered  . Sister  Alive    ROS:  Please see the history of present illness.  All other ROS reviewed and negative.     Physical Exam/Data:   Vitals:   07/16/17 2030 07/16/17 2032 07/17/17 0444 07/17/17 0800  BP: 117/73  115/62   Pulse: 89  (!) 38   Resp: 18  20   Temp: 98.1 F (36.7 C)  97.6 F (36.4 C)   TempSrc: Oral  Oral   SpO2: 100% 96% 100%   Weight:    170 lb 3.1 oz (77.2 kg)    Intake/Output Summary (Last 24 hours) at 07/17/2017  0801 Last data filed at 07/16/2017 1700 Gross per 24 hour  Intake 300 ml  Output 900 ml  Net -600 ml   Filed Weights   07/17/17 0800  Weight: 170 lb 3.1 oz (77.2 kg)   Body mass index is 24.42 kg/m.  General:  Well nourished, well developed, in no acute distress HEENT: normal Lymph: no adenopathy Neck: JVD 10 cm Endocrine:  No thryomegaly Vascular: No carotid bruits; upper extremity pulses 2+, without bruits  Cardiac:  normal S1, S2; irregular rate and rhythm, no murmur/rub/gallop Lungs: Almost clear to auscultation on the right, decreased breath sounds on the left, no wheezing, rhonchi or rales  Abd: soft, nontender, no hepatomegaly  Ext: no edema, lower extremity wounds are bandaged and dressed bilaterally, not able to access pedal pulses.  Capillary refill is within normal limits on both feet Musculoskeletal:  No deformities, BUE and BLE strength weak and equal Skin: warm and dry  Neuro:  CNs 2-12 intact, no focal abnormalities noted Psych:  Normal affect    EKG:  The EKG was personally reviewed and demonstrates: 04/22, Atrial fib, HR 62, no acute changes Telemetry:  Telemetry was personally reviewed and demonstrates:  Pt no longer on telemetry, but was atrial fib, controlled rate, occ PVCs  Relevant CV Studies:  ECHO: 04/18/2017 - Left ventricle: The cavity size was normal. Wall thickness was   increased in a pattern of moderate LVH. Systolic function was   normal. The estimated ejection fraction was in the range of 60%   to 65%. Wall motion was normal; there were no regional wall   motion abnormalities. - Mitral valve: There was mild regurgitation. - Left atrium: The atrium was moderately to severely dilated. - Right atrium: The atrium was moderately dilated. - Pulmonary arteries: Systolic pressure was mildly to moderately   increased. PA peak pressure: 37 mm Hg (S).  CATH: 09/30/2000 LEFT VENTRICULOGRAM:  Performed in the 30 degree RAO projection.  The aortic valve  was normal.  The mitral valve was normal.  The left ventricle was normal in size.  There is mild inferior basal hypokinesis noted.  The estimated ejection fraction was 60%.  CORONARY ARTERIES:  The coronary arteries arise and distribute normal.  There is very mild calcification seen in the proximal LAD and left main coronary artery is normal.  The left anterior descending is calcified proximally.  The vessel is tortuous in the distal portion, with some segmental 40% stenosis present in the mid portion and the apex.  The circumflex contains no significant stenosis.  The right coronary artery is a large vessel.  There is a severe 99% stenosis with poststenotic dilatation in the mid to distal artery just after a small posterior descending artery.  Postdilatation angiograms reveal excellent deployment of the stent with no dissection plane.  There remains an area of mild disease proximal to the stent with a good step-up and step-down.  IMPRESSION: 1. Successful stenting of the right coronary artery with stenosis going from    99% to 0%. 2. Residual atherosclerotic disease in the mid to distal left anterior    descending. 3. Normal left ventricular function with minimal inferior basal hypokinesis.  Laboratory Data:  Chemistry Recent Labs  Lab 07/15/17 0545 07/16/17 0621 07/17/17 0628  NA 142 139 142  K 3.7 3.9 3.7  CL 107 105 103  CO2 26 24 27   GLUCOSE 87 78 82  BUN 20 23* 31*  CREATININE 1.07 1.18 1.37*  CALCIUM 8.2* 8.3* 8.4*  GFRNONAA >60 56* 47*  GFRAA >60 >60 54*  ANIONGAP 9 10 12     Lab Results  Component Value Date   ALT 14 (L) 07/11/2017   AST 16 07/11/2017   ALKPHOS 45 07/11/2017   BILITOT 0.9 07/11/2017   Hematology Recent Labs  Lab 07/15/17 0545 07/16/17 0621 07/17/17 0628  WBC 5.8 6.4 7.4  RBC 2.99* 3.05* 3.04*  HGB 9.0* 9.1* 9.1*  HCT 29.2* 29.7* 29.4*  MCV 97.7 97.4 96.7  MCH 30.1 29.8 29.9  MCHC 30.8 30.6 31.0  RDW 14.7 14.7 14.8  PLT  145* 155 148*    BNP Recent Labs  Lab 07/11/17 0346  BNP 309.9*    DDimer No results for input(s): DDIMER in the last 168 hours. TSH:  Lab Results  Component Value Date   TSH 0.452 07/12/2017   Lipids: Lab Results  Component Value Date   CHOL 82 07/12/2017   HDL 46 07/12/2017   LDLCALC 27 07/12/2017   TRIG 45 07/12/2017  CHOLHDL 1.8 07/12/2017   HgbA1c: Lab Results  Component Value Date   HGBA1C 5.0 07/12/2017   Magnesium:  Magnesium  Date Value Ref Range Status  07/11/2017 1.7 1.7 - 2.4 mg/dL Final    Comment:    Performed at [facility], [place] [address], [place], [place] [address]     Radiology/Studies:  Dg Chest 1 View  Result Date: 07/15/2017 CLINICAL DATA:  Status post right thoracentesis today. EXAM: CHEST  1 VIEW COMPARISON:  CT chest 07/14/2017. Single-view of the chest 07/12/2017. FINDINGS: No pneumothorax is identified after thoracentesis. Right pleural effusion is decreased. Bibasilar airspace disease and left pleural effusion are unchanged. IMPRESSION: Negative for pneumothorax after right thoracentesis. No change in bibasilar airspace disease in the left effusion. Electronically Signed   By: Inge Rise M.D.   On: 07/15/2017 12:32   Ct Chest Wo Contrast  Result Date: 07/14/2017 CLINICAL DATA:  Shortness of breath, abnormal chest radiograph. EXAM: CT CHEST WITHOUT CONTRAST TECHNIQUE: Multidetector CT imaging of the chest was performed following the standard protocol without IV contrast. COMPARISON:  Chest radiograph 07/12/2017 and CT chest 07/06/2015. FINDINGS: Cardiovascular: Atherosclerotic calcification of the arterial vasculature, including extensive three-vessel involvement of the coronary arteries. Heart is mildly enlarged. No pericardial effusion. Mediastinum/Nodes: Mediastinal lymph nodes are not enlarged by CT size criteria. Hilar regions are difficult to evaluate without IV contrast. No axillary adenopathy. Esophagus is grossly  unremarkable. Lungs/Pleura: Image quality is degraded by respiratory motion. Large right pleural effusion and moderate left pleural effusion with collapse/consolidation in both lower lobes. Airway is grossly unremarkable. Upper Abdomen: Visualized portion of the liver is grossly unremarkable. Small stones are seen in the gallbladder. Visualized portions of the adrenal glands are unremarkable. Low-attenuation lesions in the kidneys measure up to 7.4 cm on the left, incompletely imaged. High attenuation lesions in the left kidney measure up to 11 mm, too small to characterize. Visualized portions of the spleen, pancreas, stomach and bowel are grossly unremarkable. No upper abdominal adenopathy. Musculoskeletal: Bilateral rib fractures. Degenerative changes throughout the spine. No worrisome lytic or sclerotic lesions. IMPRESSION: 1. Large right and moderate left pleural effusions with collapse/consolidation in the adjacent lower lobes. 2. Aortic atherosclerosis (ICD10-170.0). Extensive three-vessel coronary artery calcification. 3. Cholelithiasis. Electronically Signed   By: Lorin Picket M.D.   On: 07/14/2017 18:01   US Thoracentesis Asp Pleural Space W/img Guide  Result Date: 07/15/2017 INDICATION: Patient with history of coronary artery disease, CHF, prior aspiration pneumonia, bilateral pleural effusions, right greater than left. Request made for diagnostic and therapeutic right thoracentesis. EXAM: ULTRASOUND GUIDED DIAGNOSTIC AND THERAPEUTIC RIGHT THORACENTESIS MEDICATIONS: None COMPLICATIONS: None immediate. PROCEDURE: An ultrasound guided thoracentesis was thoroughly discussed with the patient and questions answered. The benefits, risks, alternatives and complications were also discussed. The patient understands and wishes to proceed with the procedure. Written consent was obtained. Ultrasound was performed to localize and mark an adequate pocket of fluid in the right chest. The area was then prepped and  draped in the normal sterile fashion. 1% Lidocaine was used for local anesthesia. Under ultrasound guidance a 6 Fr Safe-T-Centesis catheter was introduced. Thoracentesis was performed. The catheter was removed and a dressing applied. FINDINGS: A total of approximately 1.1 liters of yellow fluid was removed. Samples were sent to the laboratory as requested by the clinical team. IMPRESSION: Successful ultrasound guided diagnostic and therapeutic right thoracentesis yielding 1.1 liters of pleural fluid. Follow-up chest x-ray revealed no pneumothorax. Read by: Rowe Robert, PA-C Electronically Signed  By: Lucrezia Europe M.D.   On: 07/15/2017 12:36    Assessment and Plan:   1. Pleural effusions -Continue to follow with periodic x-rays -If volume is good, hopefully he will not need additional thoracentesis -Per IM/IR  2.  Acute on chronic diastolic CHF -I discussed with his wife the issues that we have with his volume -One issue is that he does not drink liquids consistently.  Therefore, he gets dehydrated at times. -Another issue is that he has not had daily weights.  He needs to get daily weights so that weight changes can be partly used to manage his volume. -He is volume has never been managed consistently successfully. -Hopefully, once he is drinking liquids consistently, we can come up with a consistent Lasix dose with an occasional extra dose for volume overload.  -Start daily weights today.  Continue these as an outpatient. -The importance of daily weights was emphasized to his wife.  In the rehab facility, they may be able to weigh him in a chair. -He currently qualifies for home oxygen. -MD advised on changing him to Lasix 20 mg daily with an additional dose for weight gain of 3 pounds in a day or 5 pounds in a week -Follow-up appointment made in the office for 5/16  3.  Persistent atrial fibrillation -Rate control is generally good, no change in medications -Continue Cardizem CD 300 mg and  metoprolol 25 mg twice daily  4.  Chronic kidney disease stage III -We may have to tolerate a higher than previous level of chronic kidney disease in order to keep him on the dry side which will help minimize the pleural effusions. -His kidneys are very sensitive to dehydration, labs will need to be followed closely  5.  Deconditioning -This is significant as he currently cannot stand unaided. -Hopefully he will improve a great deal at rehab. -Per his wife, he should be ready to go there today or tomorrow  Otherwise, per IM, wound care is managing his wounds Principal Problem:   Acute metabolic encephalopathy Active Problems:   Essential hypertension   Seizure disorder ([place])   Acute kidney injury ([place])   CKD (chronic kidney disease) stage 3, GFR 30-59 ml/min (H[facility])   Acute encephalopathy   Chronic diastolic CHF (congestive heart failure) (H[facility])   PAF (paroxysmal atrial fibrillation) ([facility])   Aspiration syndrome, subsequent encounter     For questions or updates, please contact [facility] Please consult [email or web address] for contact info under Cardiology/STEMI.   Signed, Rosaria Ferries, PA-C  07/17/2017 8:01 AM  I have seen and examined the patient along with Rosaria Ferries, PA-C .  I have reviewed the chart, notes and new data.  I agree with PA's note. Key new complaints: review of systems mostly from his wife; he appears very comfortable reclining at 45 degrees Key examination changes: No JVD, no S3, no leg edema, clear lungs, irregular rhythm (but not tachycardic) Key new findings / data: uptick in BUN and creatinine today  PLAN: I thinks he is at euvolemic status today. It will be critical to keep a close monitor on his weight on a daily basis and keep his weight within +/-3 lb of today. His PO intake of food and fluids is unpredictable, which has contributed to repeated hospitalizations with either hypo- or hypervolemia. Will need to discharge with a "sliding scale" diuretic  prescription, for example: - weight< 167 lb, do not give diuretic - weight 167-173 lb, furosemide 40 mg daily - weight >173 lb, furosemide  80 mg daily (with appropriate adjustments in KCl supplements) The exact weight range may be different depending on the scale used (rehab facility, home).  Will stop IV diuretics. Change to furosemide 40 mg PO daily starting tomorrow and monitor for at least another 24 hours after that.   Sanda Klein, MD, [place] [phone] 07/17/2017, 12:49 PM

## 2017-07-17 NOTE — Progress Notes (Signed)
Physical Therapy Treatment Patient Details Name: [name] MRN: [id number] DOB: 1933/07/18 Today's Date: 07/17/2017    History of Present Illness [name] is a 84 y.o. male with medical history significant for CAD, atrial fibrillation, seizure disorder, dementia, presented to the ED with lethargy. Pt with multiple admission for sepsis 2/2 recurrent aspiration pneumonia, about 3 times in the past couple of months, last admission from 4/22-4/26. Since discharge he has progressively become more lethargic and generally weak at home, unable to assist with his own care over past couple of days.     PT Comments    Patient c/o Rt knee "buckling"/hyperextending during ambulation. Patient may benefit from orthotic eval/assessment during rehab stay for this and Rt drop foot. Patient requires moderate assistance for bed mobility, transfers and ambulation. Any additional adult is required to assist with O2 portable tank and for safety as ambulation is quite difficult for patient at this time. Patient continues to be unsafe to discharge home alone due to amount of assistance required and will benefit from continued post-acute therapy. Currently recommending skilled nursing facility stay unless caregivers can provide skilled 24-hour supervision and assistance for adult of his height, size and needs.    Follow Up Recommendations  SNF;Supervision/Assistance - 24 hour     Equipment Recommendations  None recommended by PT;Other (comment)(patient may benefit from orthotic eval in rehab for Rt LE DF and knee hyperextension)    Recommendations for Other Services       Precautions / Restrictions Precautions Precautions: Fall Restrictions Weight Bearing Restrictions: No    Mobility  Bed Mobility Overal bed mobility: Needs Assistance Bed Mobility: Supine to Sit Rolling: Min assist   Supine to sit: Min assist        Transfers Overall transfer level: Needs assistance Equipment used: Rolling  walker (2 wheeled) Transfers: Sit to/from [facility] Sit to Stand: Mod assist Stand pivot transfers: Mod assist      Lateral/Scoot Transfers: Mod assist    Ambulation/Gait Ambulation/Gait assistance: +2 safety/equipment;Mod assist Ambulation Distance (Feet): 5 Feet Assistive device: Rolling walker (2 wheeled) Gait Pattern/deviations: Step-to pattern;Decreased step length - left;Decreased stance time - right;Decreased stride length;Trunk flexed;Leaning posteriorly;Shuffle Gait velocity: decreased   General Gait Details: side-step to right to transfer bed to chair; steppage gait with right leg required d/t right DF absent and Rt knee hyperextending   Stairs             Wheelchair Mobility    Modified Rankin (Stroke Patients Only)       Balance Overall balance assessment: Needs assistance Sitting-balance support: Feet supported;Bilateral upper extremity supported Sitting balance-Leahy Scale: Fair     Standing balance support: Bilateral upper extremity supported;During functional activity Standing balance-Leahy Scale: Poor                              Cognition Arousal/Alertness: Awake/alert Behavior During Therapy: WFL for tasks assessed/performed Overall Cognitive Status: History of cognitive impairments - at baseline                                        Exercises General Exercises - Lower Extremity Ankle Circles/Pumps: Seated;AAROM;Right;Strengthening;Left;10 reps Gluteal Sets: Strengthening;Both;Seated;10 reps Long Arc Quad: Seated;AAROM;Right;Strengthening;Left;10 reps Hip Flexion/Marching: Strengthening;Seated;Both;10 reps    General Comments        Pertinent Vitals/Pain Pain Assessment: No/denies pain  Home Living                      Prior Function            PT Goals (current goals can now be found in the care plan section) Acute Rehab PT Goals PT Goal Formulation: With  patient/family Potential to Achieve Goals: Fair Progress towards PT goals: Progressing toward goals    Frequency    Min 3X/week      PT Plan Current plan remains appropriate    Co-evaluation              AM-PAC PT "6 Clicks" Daily Activity  Outcome Measure  Difficulty turning over in bed (including adjusting bedclothes, sheets and blankets)?: A Lot Difficulty moving from lying on back to sitting on the side of the bed? : A Lot Difficulty sitting down on and standing up from a chair with arms (e.g., wheelchair, bedside commode, etc,.)?: A Lot Help needed moving to and from a bed to chair (including a wheelchair)?: A Lot Help needed walking in hospital room?: A Lot Help needed climbing 3-5 steps with a railing? : Total 6 Click Score: 11    End of Session Equipment Utilized During Treatment: Gait belt;Oxygen(RA for seated; 2L O2 for standing and ambulation) Activity Tolerance: Patient limited by fatigue;Other (comment)(pt c/o Rt knee "buckling"/hyperextending and afraid to damage it further.) Patient left: in chair;with call bell/phone within reach;with family/visitor present Nurse Communication: Mobility status PT Visit Diagnosis: Muscle weakness (generalized) (M62.81);Other abnormalities of gait and mobility (R26.89);Unsteadiness on feet (R26.81)     Time: 2426-8341 PT Time Calculation (min) (ACUTE ONLY): 31 min  Charges:  $Therapeutic Activity: 23-37 mins                    G Codes:       Khristine Verno D. Hartnett-Rands, MS, PT Per [place] [phone] 07/17/2017, 1:12 PM

## 2017-07-17 NOTE — Progress Notes (Signed)
PROGRESS NOTE    [name]  IDP:[id number] DOB: 1933-05-01 DOA: 07/11/2017 PCP: [name], MD     Brief Narrative:  [name] is an 84 y.o.malewith medical history significant for CAD, atrial fibrillation, seizure disorder, dementia, who presented to the ED withlethargy. Pt with multiple admission for sepsis secondary to recurrent aspiration pneumonia, about 3 times in the past couple of months, last admission from 4/22-4/26. Since discharge, he has progressively become more lethargic and generally weak at home, unable to assist with his own care over past couple of days. Wife reported possible aspiration event prior to admission. In the ED, pt noted to have AKI, creat 1.5, BUN 30. CXR looks maybe slightly worse than 4/22 with BLL opacities and small B pleural effusions. Pt admitted for further management.  Assessment & Plan:   Principal Problem:   Acute hypoxemic respiratory failure (HCC) Active Problems:   Essential hypertension   Seizure disorder (HCC)   Acute kidney injury ([place])   CKD (chronic kidney disease) stage 3, GFR 30-59 ml/min (HCC)   Acute metabolic encephalopathy   Acute encephalopathy   Chronic diastolic CHF (congestive heart failure) (HCC)   PAF (paroxysmal atrial fibrillation) (HCC)   Aspiration syndrome, subsequent encounter   Acute hypoxic respiratory failure 2/2 right pleural effusion -ABG showed PO2 of 65.7 on 2L of O2 -CXR 5/2 showed possible consolidation or atelectasis in the lung bases with small bilateral pleural effusions appears to be increasing -CXR 5/3 showed cardiomegaly, diffuse pulmonary interstitial edema, small bilateral pleural effusions  -CT chest 5/5 showed large right and moderate left pleural effusions with collapse/consolidation in the adjacent lower lobes -S/p R thoracentesis 5/6 yielding 1.1L of transudative fluid, gram stain negative, culture no growth thus far  -Continue Violet O2, wean as tolerated   Acute on chronic  diastolic CHF -Echo EF 44-31%  -Cardiology consulted  -Daily weight, strict I/Os -Lasix IV lasix 40mg  daily  -Follow up Cardiology on 5/40    Acute metabolic encephalopathy -With mild dementia at baseline  -Resolved likely 2/2 hypoxia -Improved   Persistent A Fib -Rate controlled -Continue cardizem, metoprolol, eliquis  CKD stage 3 -Baseline Cr 1.1-1.3   Iron deficiency anemia -Continue PO iron supplement -Hgb stable   Dysphagia -SLP consulted, currently on dysphagia 3 diet   Seizure disorder -Continue depakote  Hypothyroidism -Continue synthroid  HLD -Continue pravachol   GERD -Continue PPI   Chronic RLE wound -Wound care consult  Chronic steroid use (has been on it for years ??s/p knee surgery) -Continue prednisone    DVT prophylaxis: Eliquis  Code Status: DNR Family Communication: Wife at bedside Disposition Plan: SNF on discharge, possibly 5/9    Consultants:   Cardiology  Procedures:   Right thoracentesis 5/6  Antimicrobials:  Anti-infectives (From admission, onward)   None       Subjective: Feeling well, much better since admission. Eating breakfast and denies any chest pain, shortness of breath, nausea, vomiting.   Objective: Vitals:   07/17/17 0444 07/17/17 0800 07/17/17 0848 07/17/17 0937  BP: 115/62  119/60   Pulse: (!) 38  89   Resp: 20     Temp: 97.6 F (36.4 C)     TempSrc: Oral     SpO2: 100%  100% 99%  Weight:  77.2 kg (170 lb 3.1 oz)      Intake/Output Summary (Last 24 hours) at 07/17/2017 1308 Last data filed at 07/17/2017 0900 Gross per 24 hour  Intake 220 ml  Output 900 ml  Net -680 ml   Filed Weights   07/17/17 0800  Weight: 77.2 kg (170 lb 3.1 oz)    Examination:  General exam: Appears calm and comfortable  Respiratory system: Diminished breath sound on left, clear otherwise without wheeze or rhonchi. Respiratory effort normal. Cardiovascular system: S1 & S2 heard. No JVD, murmurs, rubs, gallops or  clicks. No pedal edema. Gastrointestinal system: Abdomen is nondistended, soft and nontender. No organomegaly or masses felt. Normal bowel sounds heard. Central nervous system: Alert. No focal neurological deficits. Extremities: Symmetric 5 x 5 power. Skin: No rashes, lesions or ulcers on exposed skin  Psychiatry: Judgement and insight appear stable   Data Reviewed: I have personally reviewed following labs and imaging studies  CBC: Recent Labs  Lab 07/13/17 0557 07/14/17 0526 07/15/17 0545 07/16/17 0621 07/17/17 0628  WBC 7.9 8.3 5.8 6.4 7.4  NEUTROABS 6.3 6.6 4.3 4.7 5.4  HGB 9.3* 8.8* 9.0* 9.1* 9.1*  HCT 29.2* 27.6* 29.2* 29.7* 29.4*  MCV 96.7 96.5 97.7 97.4 96.7  PLT 115* 132* 145* 155 494*   Basic Metabolic Panel: Recent Labs  Lab 07/11/17 0831  07/13/17 0557 07/14/17 0526 07/15/17 0545 07/16/17 0621 07/17/17 0628  NA  --    < > 138 141 142 139 142  K  --    < > 3.8 3.6 3.7 3.9 3.7  CL  --    < > 103 107 107 105 103  CO2  --    < > 23 25 26 24 27   GLUCOSE  --    < > 89 81 87 78 82  BUN  --    < > 30* 23* 20 23* 31*  CREATININE  --    < > 1.30* 1.17 1.07 1.18 1.37*  CALCIUM  --    < > 7.6* 7.8* 8.2* 8.3* 8.4*  MG 1.7  --   --   --   --   --   --    < > = values in this interval not displayed.   GFR: Estimated Creatinine Clearance: 43.7 mL/min (A) (by C-G formula based on SCr of 1.37 mg/dL (H)). Liver Function Tests: Recent Labs  Lab 07/11/17 0831  AST 16  ALT 14*  ALKPHOS 45  BILITOT 0.9  PROT 5.5*  ALBUMIN 3.1*   No results for input(s): LIPASE, AMYLASE in the last 168 hours. No results for input(s): AMMONIA in the last 168 hours. Coagulation Profile: No results for input(s): INR, PROTIME in the last 168 hours. Cardiac Enzymes: No results for input(s): CKTOTAL, CKMB, CKMBINDEX, TROPONINI in the last 168 hours. BNP (last 3 results) No results for input(s): PROBNP in the last 8760 hours. HbA1C: No results for input(s): HGBA1C in the last 72  hours. CBG: No results for input(s): GLUCAP in the last 168 hours. Lipid Profile: No results for input(s): CHOL, HDL, LDLCALC, TRIG, CHOLHDL, LDLDIRECT in the last 72 hours. Thyroid Function Tests: No results for input(s): TSH, T4TOTAL, FREET4, T3FREE, THYROIDAB in the last 72 hours. Anemia Panel: No results for input(s): VITAMINB12, FOLATE, FERRITIN, TIBC, IRON, RETICCTPCT in the last 72 hours. Sepsis Labs: Recent Labs  Lab 07/11/17 0346 07/12/17 0624  PROCALCITON <0.10  --   LATICACIDVEN  --  1.0    Recent Results (from the past 240 hour(s))  Culture, blood (routine x 2) Call MD if unable to obtain prior to antibiotics being given     Status: None   Collection Time: 07/11/17  8:31 AM  Result Value Ref  Range Status   Specimen Description   Final    BLOOD RIGHT ANTECUBITAL Performed at [facility] [address] [place], [place] [address]    Special Requests   Final    BOTTLES DRAWN AEROBIC ONLY Blood Culture adequate volume Performed at [facility] [address], [place], [place] [address]    Culture   Final    NO GROWTH 5 DAYS Performed at [facility], [place] [address] [place], [place] [address]    Report Status 07/16/2017 FINAL  Final  Culture, blood (routine x 2) Call MD if unable to obtain prior to antibiotics being given     Status: None   Collection Time: 07/11/17  8:43 AM  Result Value Ref Range Status   Specimen Description   Final    BLOOD LEFT HAND Performed at [facility] [address] [place], [place] [address]    Special Requests   Final    BOTTLES DRAWN AEROBIC AND ANAEROBIC Blood Culture adequate volume Performed at [facility] [address] [place], [place] [address]    Culture   Final    NO GROWTH 5 DAYS Performed at [facility], [place] [address], [place], [place] [address]    Report Status 07/16/2017 FINAL  Final  Culture, Urine     Status:  Abnormal   Collection Time: 07/11/17  4:34 PM  Result Value Ref Range Status   Specimen Description   Final    URINE, RANDOM Performed at [facility] [address], [place], [place] [address]    Culture MULTIPLE SPECIES PRESENT, SUGGEST RECOLLECTION (A)  Final   Report Status 07/12/2017 FINAL  Final  Culture, Urine     Status: Abnormal   Collection Time: 07/13/17 12:30 AM  Result Value Ref Range Status   Specimen Description   Final    URINE, RANDOM Performed at [facility] [address] [place], [place] [address]    Special Requests   Final    NONE Performed at [facility], [place] [address] [place], [place] [address]    Culture 20,000 COLONIES/mL ESCHERICHIA COLI (A)  Final   Report Status 07/15/2017 FINAL  Final   Organism ID, Bacteria ESCHERICHIA COLI (A)  Final      Susceptibility   Escherichia coli - MIC*    AMPICILLIN <=2 SENSITIVE Sensitive     CEFAZOLIN <=4 SENSITIVE Sensitive     CEFTRIAXONE <=1 SENSITIVE Sensitive     CIPROFLOXACIN <=0.25 SENSITIVE Sensitive     GENTAMICIN <=1 SENSITIVE Sensitive     IMIPENEM <=0.25 SENSITIVE Sensitive     NITROFURANTOIN <=16 SENSITIVE Sensitive     TRIMETH/SULFA <=20 SENSITIVE Sensitive     AMPICILLIN/SULBACTAM <=2 SENSITIVE Sensitive     PIP/TAZO <=4 SENSITIVE Sensitive     Extended ESBL NEGATIVE Sensitive     * 20,000 COLONIES/mL ESCHERICHIA COLI  Culture, body fluid-bottle     Status: None (Preliminary result)   Collection Time: 07/15/17 12:40 PM  Result Value Ref Range Status   Specimen Description PLEURAL  Final   Special Requests RIGHT  Final   Culture   Final    NO GROWTH 2 DAYS Performed at [facility], [address], [place], [place] [address]    Report Status PENDING  Incomplete  Gram stain     Status: None   Collection Time: 07/15/17 12:40 PM  Result Value Ref Range  Status   Specimen Description PLEURAL  Final   Special Requests RIGHT   Final   Gram Stain   Final    WBC PRESENT,BOTH PMN AND MONONUCLEAR NO ORGANISMS SEEN Performed at [facility], [address] [place], [place] [address]    Report Status 07/15/2017 FINAL  Final       Radiology Studies: No results found.    Scheduled Meds: . apixaban  5 mg Oral BID  . calcium carbonate  1,250 mg Oral BID WC  . cholecalciferol  1,000 Units Oral BID  . collagenase   Topical Daily  . diltiazem  300 mg Oral Daily  . divalproex  250 mg Oral Daily  . divalproex  750 mg Oral QHS  . doxazosin  4 mg Oral Daily  . ezetimibe  5 mg Oral QHS  . feeding supplement  1 Container Oral TID BM  . feeding supplement (ENSURE ENLIVE)  237 mL Oral TID BM  . feeding supplement (PRO-STAT SUGAR FREE 64)  30 mL Oral BID  . ferrous sulfate  325 mg Oral BID WC  . fluticasone  1 spray Each Nare Daily  . furosemide  40 mg Intravenous Daily  . gabapentin  300 mg Oral BID  . ipratropium-albuterol  3 mL Nebulization TID  . levothyroxine  150 mcg Oral QAC breakfast  . lidocaine  1 patch Transdermal Q24H  . metoprolol tartrate  25 mg Oral BID  . pantoprazole  40 mg Oral Daily  . pravastatin  40 mg Oral QHS  . predniSONE  10 mg Oral Q breakfast  . vitamin B-12  500 mcg Oral QHS   Continuous Infusions:   LOS: 6 days    Time spent: 35 minutes   [name], DO [facility] [email or web address] Password [email or web address] 07/17/2017, 1:08 PM

## 2017-07-17 NOTE — Progress Notes (Addendum)
Error

## 2017-07-17 NOTE — Progress Notes (Signed)
  Speech Language Pathology Treatment: Dysphagia  Patient Details Name: [name] MRN: [id number] DOB: 1934/01/01 Today's Date: 07/17/2017 Time: 7262-0355 SLP Time Calculation (min) (ACUTE ONLY): 15 min  Assessment / Plan / Recommendation Clinical Impression  Pt sitting upright and looks comfortable!  He still requires cues to cease talking with solids in mouth - pt tends to want to talk while eating - max cues to focus on task effective.  No indications of aspiration with all po observed - (pot roast, juice).   He tends to take large boluses though and will continue to benefit from supervision (from family acceptable) for maximal airway protection - max verbal cues today.  Pt with no difficulty breathing today and he and his son present deny him coughing with intake.    Recommend to continue dys3/ground meats/thin with precautions, will follow up for possible RMST training and family education.    HPI HPI: [name] is a 84 y.o. male with medical history significant of HTN, HLD, diastolic CHF, atrial fibrillation on chronic anticoagulation, seizures, hypothyroidism, dementia, chronic lower extremity wound, and GERD; who presents from rehab after being found to have fever with altered mental status.  Pt found to be septic secondary to pna vs wound infection. CXR shows Left lung base atelectasis versus infiltrate increased.  Pt also with h/o multiple spine surgeries- posterior C5-T1 2013 and C2-C5 01/2013.  Swallow eval reordered.  Advised to proceed with MBS since prior clinical evals negative and pt continues with admits for pna.  Pt has not had an MBS in the past and his dementia, and spinal surgeries may impact sensorimotor abilities.        SLP Plan  Continue with current plan of care       Recommendations  Diet recommendations: Dysphagia 3 (mechanical soft);Thin liquid Liquids provided via: Cup;Straw Medication Administration: Whole meds with puree Supervision: Patient able to  self feed Compensations: Slow rate;Small sips/bites;Other (Comment)(start meals with liquids, drink liquids t/o meal) Postural Changes and/or Swallow Maneuvers: Seated upright 90 degrees;Upright 30-60 min after meal                Oral Care Recommendations: Oral care QID Follow up Recommendations: Skilled Nursing facility SLP Visit Diagnosis: Dysphagia, oral phase (R13.11) Plan: Continue with current plan of care       GO                [name] 07/17/2017, 3:29 PM  Luanna Salk, [facility] SLP [phone]

## 2017-07-18 ENCOUNTER — Ambulatory Visit: Payer: Medicare Other | Admitting: Cardiovascular Disease

## 2017-07-18 DIAGNOSIS — I5032 Chronic diastolic (congestive) heart failure: Secondary | ICD-10-CM

## 2017-07-18 LAB — CBC WITH DIFFERENTIAL/PLATELET
Basophils Absolute: 0 10*3/uL (ref 0.0–0.1)
Basophils Relative: 1 %
Eosinophils Absolute: 0.1 10*3/uL (ref 0.0–0.7)
Eosinophils Relative: 1 %
HCT: 30 % — ABNORMAL LOW (ref 39.0–52.0)
Hemoglobin: 9.2 g/dL — ABNORMAL LOW (ref 13.0–17.0)
Lymphocytes Relative: 13 %
Lymphs Abs: 1.1 10*3/uL (ref 0.7–4.0)
MCH: 29.6 pg (ref 26.0–34.0)
MCHC: 30.7 g/dL (ref 30.0–36.0)
MCV: 96.5 fL (ref 78.0–100.0)
Monocytes Absolute: 0.9 10*3/uL (ref 0.1–1.0)
Monocytes Relative: 11 %
Neutro Abs: 6.1 10*3/uL (ref 1.7–7.7)
Neutrophils Relative %: 74 %
Platelets: 167 10*3/uL (ref 150–400)
RBC: 3.11 MIL/uL — ABNORMAL LOW (ref 4.22–5.81)
RDW: 14.6 % (ref 11.5–15.5)
WBC: 8.3 10*3/uL (ref 4.0–10.5)

## 2017-07-18 LAB — BASIC METABOLIC PANEL
Anion gap: 11 (ref 5–15)
BUN: 38 mg/dL — ABNORMAL HIGH (ref 6–20)
CO2: 27 mmol/L (ref 22–32)
Calcium: 8.4 mg/dL — ABNORMAL LOW (ref 8.9–10.3)
Chloride: 102 mmol/L (ref 101–111)
Creatinine, Ser: 1.29 mg/dL — ABNORMAL HIGH (ref 0.61–1.24)
GFR calc Af Amer: 58 mL/min — ABNORMAL LOW (ref >60)
GFR calc non Af Amer: 50 mL/min — ABNORMAL LOW (ref >60)
Glucose, Bld: 79 mg/dL (ref 65–99)
Potassium: 3.7 mmol/L (ref 3.5–5.1)
Sodium: 140 mmol/L (ref 135–145)

## 2017-07-18 MED ORDER — LIVING BETTER WITH HEART FAILURE BOOK
Freq: Once | Status: AC
  Administered 2017-07-18: 15:00:00

## 2017-07-18 MED ORDER — IPRATROPIUM-ALBUTEROL 0.5-2.5 (3) MG/3ML IN SOLN
3.0000 mL | Freq: Two times a day (BID) | RESPIRATORY_TRACT | Status: DC
  Administered 2017-07-18 – 2017-07-19 (×2): 3 mL via RESPIRATORY_TRACT
  Filled 2017-07-18 (×2): qty 3

## 2017-07-18 NOTE — Consult Note (Addendum)
   [facility] CM Inpatient Consult   07/18/2017  [name] Nov 05, 1933 [id number]   Spoke with inpatient LCSW to discuss [name]. [name] was recently active with [facility] [place] program. His discharge plan is for ST-SNF. Inpatient LCSW indicates that patient's wife is interested in [facility] [facility] services as well.   Went to bedside to speak with [name] and wife [name]. However, [name] was on her way back to hospital. Patient gave permission for writer to speak with his wife [name] about [place] Management services.   Spoke with [name] about [facility] Care Management follow up while at [facility]. [name] endorses that [name] will discharge to [facility] tomorrow. Discussed that [facility] LCSW can follow up with her while patient is at [facility]. [name] is appreciative of the additional support. Explained that [place] program is discharge is from hospital to home not from SNF to home. [name] expressed understanding.   [name] endorses the plan is for short term at [facility]. States [name] will need home health for PT/RN upon discharge from SNF. States she would prefer [facility] if they can have Audiological scientist for [employer] for PT. Otherwise, she states she would prefer [facility]. Made [name] aware that writer will relay all of this information to [facility] LCSW as well.   [facility] Care Management written consent obtained and [facility] Care Management folder provided. Confirmed best contact number for [name] is her cell at [phone] and home is [phone].  Will make referral to [facility] LCSW for follow up while at SNF. [name] has had multiple hospitalizations and wife endorses patient has been on the decline. Extreme risk of unplanned readmission score of 43%. Medical history of CAD, afib, seizure, dementia, HTN.   [name], MSN-Ed, RN,BSN [facility] Liaison [phone]

## 2017-07-18 NOTE — Progress Notes (Signed)
PHARMACY NOTE -  Eliquis  Pharmacy has been assisting with dosing of Eliquis for Afib.  Dosage remains stable at 5 mg PO bid and need for further dosage adjustment appears unlikely at present given improved renal function  CBC stable  Pharmacy will sign off, following peripherally for renal adjustments and changes in CBC. Please reconsult if a change in clinical status warrants re-evaluation of dosage.  Reuel Boom, PharmD, BCPS [phone] 07/18/2017, 3:12 PM

## 2017-07-18 NOTE — Evaluation (Signed)
SLP Cancellation Note  Patient Details Name: TZVI ECONOMOU MRN: [id number] DOB: May 28, 1933   Cancelled treatment:       Reason Eval/Treat Not Completed: Other (comment)(pt was with friend and is tolerating po, he requested to be able to visit with friend; wife not present, SLP to follow up next date to start RMST - spoke to Dr Maylene Roes and received approval for RMST)   Luanna Salk, [facility] SLP [phone]

## 2017-07-18 NOTE — Progress Notes (Signed)
Physical Therapy Treatment Patient Details Name: [name] MRN: [id number] DOB: 08-07-1933 Today's Date: 07/18/2017    History of Present Illness R pleural Effusion     PT Comments    Pt OOB in recliner with spouse in room.  Spouse stated, pt was able to amvb with a walker and recently spent Rehab days at [facility].  Pt was getting weaker once home.  Noted R LE weakness DF 0/4, seated knee extension LAQ's 0/4 and seated hip flex 1/4.  Spouse stated pt had an AFO but it didn't fit right so he never wore it.  Assisted out of recliner + 2 side by side assist to stand upright and amb using B platform EVA walker.  Heavy lean on walker and great difficulty advancing and fully weight bearing through R LE.  Also present with R LE drag.  Amb twice as pt needed a seated break.  Avg RA was 94% and no real dyspnea noted just fatigue/weakness.     Follow Up Recommendations  SNF     Equipment Recommendations       Recommendations for Other Services       Precautions / Restrictions Precautions Precautions: Fall Precaution Comments: R LE weakness with foot drop (nerve damage from old back surgery stated spouse)    Mobility  Bed Mobility               General bed mobility comments: OOB in reclioner   nusing stated + 2  Transfers Overall transfer level: Needs assistance Equipment used: Bilateral platform walker(EVA walker) Transfers: Sit to/from Stand Sit to Stand: Max assist;+2 safety/equipment         General transfer comment: side by side MAX assist to stand and control sit  Ambulation/Gait Ambulation/Gait assistance: Mod assist;+2 physical assistance;+2 safety/equipment Ambulation Distance (Feet): 30 Feet(15 feet x 2 one sitting rest break) Assistive device: Bilateral platform walker(EVA walker) Gait Pattern/deviations: Step-to pattern;Decreased stance time - right;Decreased weight shift to right Gait velocity: decreased   General Gait Details: R toe drag and used EVA  walker for increased support, increase amb distance and increase WBing time standing upright.  Spouse assisted by following with recliner.     Stairs             Wheelchair Mobility    Modified Rankin (Stroke Patients Only)       Balance                                            Cognition Arousal/Alertness: Awake/alert   Overall Cognitive Status: Within Functional Limits for tasks assessed                                 General Comments: pleasant      Exercises      General Comments        Pertinent Vitals/Pain Pain Assessment: No/denies pain    Home Living                      Prior Function            PT Goals (current goals can now be found in the care plan section) Progress towards PT goals: Progressing toward goals    Frequency    Min 3X/week      PT Plan  Current plan remains appropriate    Co-evaluation              AM-PAC PT "6 Clicks" Daily Activity  Outcome Measure  Difficulty turning over in bed (including adjusting bedclothes, sheets and blankets)?: A Lot Difficulty moving from lying on back to sitting on the side of the bed? : A Lot Difficulty sitting down on and standing up from a chair with arms (e.g., wheelchair, bedside commode, etc,.)?: A Lot Help needed moving to and from a bed to chair (including a wheelchair)?: A Lot Help needed walking in hospital room?: A Lot Help needed climbing 3-5 steps with a railing? : Total 6 Click Score: 11    End of Session Equipment Utilized During Treatment: Gait belt Activity Tolerance: Patient limited by fatigue(weakness) Patient left: in chair;with call bell/phone within reach;with family/visitor present Nurse Communication: Mobility status PT Visit Diagnosis: Muscle weakness (generalized) (M62.81);Other abnormalities of gait and mobility (R26.89);Unsteadiness on feet (R26.81)     Time: 4166-0630 PT Time Calculation (min) (ACUTE ONLY):  28 min  Charges:  $Gait Training: 8-22 mins $Therapeutic Activity: 8-22 mins                    G Codes:       Rica Koyanagi  PTA [facility]  Acute  Rehab Pager      [phone]

## 2017-07-18 NOTE — Progress Notes (Signed)
PROGRESS NOTE    [name]  DJM:[id number] DOB: 1933-05-02 DOA: 07/11/2017 PCP: [name], MD     Brief Narrative:  [name] is an 84 y.o.malewith medical history significant for CAD, atrial fibrillation, seizure disorder, dementia, who presented to the ED withlethargy. Pt with multiple admission for sepsis secondary to recurrent aspiration pneumonia, about 3 times in the past couple of months, last admission from 4/22-4/26. Since discharge, he has progressively become more lethargic and generally weak at home, unable to assist with his own care over past couple of days. Wife reported possible aspiration event prior to admission. In the ED, pt noted to have AKI, creat 1.5, BUN 30. CXR looks maybe slightly worse than 4/22 with BLL opacities and small B pleural effusions. Pt admitted for further management.  Assessment & Plan:   Principal Problem:   Acute hypoxemic respiratory failure (HCC) Active Problems:   Essential hypertension   Seizure disorder (HCC)   Acute kidney injury ([place])   CKD (chronic kidney disease) stage 3, GFR 30-59 ml/min (HCC)   Acute metabolic encephalopathy   Acute encephalopathy   Chronic diastolic CHF (congestive heart failure) (HCC)   PAF (paroxysmal atrial fibrillation) (HCC)   Aspiration syndrome, subsequent encounter   Acute hypoxic respiratory failure 2/2 right pleural effusion -ABG showed PO2 of 65.7 on 2L of O2 -CXR 5/2 showed possible consolidation or atelectasis in the lung bases with small bilateral pleural effusions appears to be increasing -CXR 5/3 showed cardiomegaly, diffuse pulmonary interstitial edema, small bilateral pleural effusions  -CT chest 5/5 showed large right and moderate left pleural effusions with collapse/consolidation in the adjacent lower lobes -S/p R thoracentesis 5/6 yielding 1.1L of transudative fluid, gram stain negative, culture no growth thus far  -On room air today   Acute on chronic diastolic CHF -Echo  EF 22-29%  -Cardiology consulted  -Daily weight, strict I/Os -Lasix decreased to PO 40mg  daily  -Follow up Cardiology on 5/16 with Dr. Oval Linsey   Acute metabolic encephalopathy -With mild dementia at baseline  -Resolved likely 2/2 hypoxia -Improved   Persistent A Fib -Rate controlled -Continue cardizem, metoprolol, eliquis  CKD stage 3 -Baseline Cr 1.1-1.3  -Cr stable   Iron deficiency anemia -Continue PO iron supplement -Hgb stable   Dysphagia -SLP consulted, currently on dysphagia 3 diet   Seizure disorder -Continue depakote  Hypothyroidism -Continue synthroid  HLD -Continue pravachol   GERD -Continue PPI   Chronic RLE wound -Wound care consult  Chronic steroid use (has been on it for years ??s/p knee surgery) -Continue prednisone    DVT prophylaxis: Eliquis  Code Status: DNR Family Communication: Wife at bedside Disposition Plan: SNF on discharge 5/10 if clinically stable    Consultants:   Cardiology  Procedures:   Right thoracentesis 5/6  Antimicrobials:  Anti-infectives (From admission, onward)   None       Subjective: No complaints today.  Worked with physical therapy in the hallway.  Denies any worsening shortness of breath, peripheral edema.  Objective: Vitals:   07/18/17 0438 07/18/17 0859 07/18/17 0940 07/18/17 1041  BP: 123/77 120/68    Pulse: 63 96    Resp: 18     Temp: 98 F (36.7 C)     TempSrc: Oral     SpO2: 93%  96%   Weight: 73.1 kg (161 lb 2.5 oz)   74.6 kg (164 lb 6.4 oz)    Intake/Output Summary (Last 24 hours) at 07/18/2017 1249 Last data filed at 07/18/2017 0903 Gross per  24 hour  Intake 480 ml  Output 1900 ml  Net -1420 ml   Filed Weights   07/17/17 0800 07/18/17 0438 07/18/17 1041  Weight: 77.2 kg (170 lb 3.1 oz) 73.1 kg (161 lb 2.5 oz) 74.6 kg (164 lb 6.4 oz)    Examination: General exam: Appears calm and comfortable  Respiratory system: Clear to auscultation. Respiratory effort  normal. Cardiovascular system: S1 & S2 heard. No JVD, murmurs, rubs, gallops or clicks. No pedal edema. Gastrointestinal system: Abdomen is nondistended, soft and nontender. No organomegaly or masses felt. Normal bowel sounds heard. Central nervous system: Alert. No focal neurological deficits. Extremities: Symmetric 5 x 5 power. Skin: No rashes, lesions or ulcers on exposed skin  Psychiatry: Hx dementia    Data Reviewed: I have personally reviewed following labs and imaging studies  CBC: Recent Labs  Lab 07/14/17 0526 07/15/17 0545 07/16/17 0621 07/17/17 0628 07/18/17 0546  WBC 8.3 5.8 6.4 7.4 8.3  NEUTROABS 6.6 4.3 4.7 5.4 6.1  HGB 8.8* 9.0* 9.1* 9.1* 9.2*  HCT 27.6* 29.2* 29.7* 29.4* 30.0*  MCV 96.5 97.7 97.4 96.7 96.5  PLT 132* 145* 155 148* 570   Basic Metabolic Panel: Recent Labs  Lab 07/14/17 0526 07/15/17 0545 07/16/17 0621 07/17/17 0628 07/18/17 0546  NA 141 142 139 142 140  K 3.6 3.7 3.9 3.7 3.7  CL 107 107 105 103 102  CO2 25 26 24 27 27   GLUCOSE 81 87 78 82 79  BUN 23* 20 23* 31* 38*  CREATININE 1.17 1.07 1.18 1.37* 1.29*  CALCIUM 7.8* 8.2* 8.3* 8.4* 8.4*   GFR: Estimated Creatinine Clearance: 46.4 mL/min (A) (by C-G formula based on SCr of 1.29 mg/dL (H)). Liver Function Tests: No results for input(s): AST, ALT, ALKPHOS, BILITOT, PROT, ALBUMIN in the last 168 hours. No results for input(s): LIPASE, AMYLASE in the last 168 hours. No results for input(s): AMMONIA in the last 168 hours. Coagulation Profile: No results for input(s): INR, PROTIME in the last 168 hours. Cardiac Enzymes: No results for input(s): CKTOTAL, CKMB, CKMBINDEX, TROPONINI in the last 168 hours. BNP (last 3 results) No results for input(s): PROBNP in the last 8760 hours. HbA1C: No results for input(s): HGBA1C in the last 72 hours. CBG: No results for input(s): GLUCAP in the last 168 hours. Lipid Profile: No results for input(s): CHOL, HDL, LDLCALC, TRIG, CHOLHDL, LDLDIRECT in  the last 72 hours. Thyroid Function Tests: No results for input(s): TSH, T4TOTAL, FREET4, T3FREE, THYROIDAB in the last 72 hours. Anemia Panel: No results for input(s): VITAMINB12, FOLATE, FERRITIN, TIBC, IRON, RETICCTPCT in the last 72 hours. Sepsis Labs: Recent Labs  Lab 07/12/17 1779  LATICACIDVEN 1.0    Recent Results (from the past 240 hour(s))  Culture, blood (routine x 2) Call MD if unable to obtain prior to antibiotics being given     Status: None   Collection Time: 07/11/17  8:31 AM  Result Value Ref Range Status   Specimen Description   Final    BLOOD RIGHT ANTECUBITAL Performed at [facility] [address] [place], [place] [address]    Special Requests   Final    BOTTLES DRAWN AEROBIC ONLY Blood Culture adequate volume Performed at [facility] [address] [place], [place] [address]    Culture   Final    NO GROWTH 5 DAYS Performed at [facility], [place] [address] [place], [place] [address]    Report Status 07/16/2017 FINAL  Final  Culture, blood (routine x 2) Call MD if unable to obtain prior to antibiotics being given     Status: None   Collection Time: 07/11/17  8:43 AM  Result Value Ref Range Status   Specimen Description   Final    BLOOD LEFT HAND Performed at [facility] [address], [place], [place] [address]    Special Requests   Final    BOTTLES DRAWN AEROBIC AND ANAEROBIC Blood Culture adequate volume Performed at [facility] [address], [place], [place] [address]    Culture   Final    NO GROWTH 5 DAYS Performed at [facility], [place] [address] [place], [place] [address]    Report Status 07/16/2017 FINAL  Final  Culture, Urine     Status: Abnormal   Collection Time: 07/11/17  4:34 PM  Result Value Ref Range Status   Specimen Description   Final    URINE, RANDOM Performed at [facility] [address], [place],  [place] [address]    Culture MULTIPLE SPECIES PRESENT, SUGGEST RECOLLECTION (A)  Final   Report Status 07/12/2017 FINAL  Final  Culture, Urine     Status: Abnormal   Collection Time: 07/13/17 12:30 AM  Result Value Ref Range Status   Specimen Description   Final    URINE, RANDOM Performed at [facility] [address], [place], [place] [address]    Special Requests   Final    NONE Performed at [facility], [place] [address] [place], [place] [address]    Culture 20,000 COLONIES/mL ESCHERICHIA COLI (A)  Final   Report Status 07/15/2017 FINAL  Final   Organism ID, Bacteria ESCHERICHIA COLI (A)  Final      Susceptibility   Escherichia coli - MIC*    AMPICILLIN <=2 SENSITIVE Sensitive     CEFAZOLIN <=4 SENSITIVE Sensitive     CEFTRIAXONE <=1 SENSITIVE Sensitive     CIPROFLOXACIN <=0.25 SENSITIVE Sensitive     GENTAMICIN <=1 SENSITIVE Sensitive     IMIPENEM <=0.25 SENSITIVE Sensitive     NITROFURANTOIN <=16 SENSITIVE Sensitive     TRIMETH/SULFA <=20 SENSITIVE Sensitive     AMPICILLIN/SULBACTAM <=2 SENSITIVE Sensitive     PIP/TAZO <=4 SENSITIVE Sensitive     Extended ESBL NEGATIVE Sensitive     * 20,000 COLONIES/mL ESCHERICHIA COLI  Culture, body fluid-bottle     Status: None (Preliminary result)   Collection Time: 07/15/17 12:40 PM  Result Value Ref Range Status   Specimen Description PLEURAL  Final   Special Requests RIGHT  Final   Culture   Final    NO GROWTH 2 DAYS Performed at [facility], [address] [place], [place] [address]    Report Status PENDING  Incomplete  Gram stain     Status: None   Collection Time: 07/15/17 12:40 PM  Result Value Ref Range Status   Specimen Description PLEURAL  Final   Special Requests RIGHT  Final   Gram Stain   Final    WBC PRESENT,BOTH PMN AND MONONUCLEAR NO ORGANISMS SEEN Performed at [facility], [address], [place],  [address]    Report Status 07/15/2017 FINAL  Final        Radiology Studies: No results found.    Scheduled Meds: . apixaban  5 mg Oral BID  . calcium carbonate  1,250 mg Oral BID WC  . cholecalciferol  1,000 Units Oral BID  .  collagenase   Topical Daily  . diltiazem  300 mg Oral Daily  . divalproex  250 mg Oral Daily  . divalproex  750 mg Oral QHS  . doxazosin  4 mg Oral Daily  . ezetimibe  5 mg Oral QHS  . feeding supplement  1 Container Oral TID BM  . feeding supplement (ENSURE ENLIVE)  237 mL Oral TID BM  . feeding supplement (PRO-STAT SUGAR FREE 64)  30 mL Oral BID  . ferrous sulfate  325 mg Oral BID WC  . fluticasone  1 spray Each Nare Daily  . furosemide  40 mg Oral Daily  . gabapentin  300 mg Oral BID  . ipratropium-albuterol  3 mL Nebulization BID  . levothyroxine  150 mcg Oral QAC breakfast  . lidocaine  1 patch Transdermal Q24H  . metoprolol tartrate  25 mg Oral BID  . pantoprazole  40 mg Oral Daily  . pravastatin  40 mg Oral QHS  . predniSONE  10 mg Oral Q breakfast  . vitamin B-12  500 mcg Oral QHS   Continuous Infusions:   LOS: 7 days    Time spent: 30 minutes   [name], DO [facility] [email or web address] Password [email or web address] 07/18/2017, 12:49 PM

## 2017-07-18 NOTE — Progress Notes (Addendum)
Progress Note  Patient Name: [name] Date of Encounter: 07/18/2017  Primary Cardiologist:  Skeet Latch, MD  Subjective   Breathing at baseline, no chest pain. Wife present. He is going to [facility]. They have promised daily wts and fax them so we can order the Lasix  Inpatient Medications    Scheduled Meds: . apixaban  5 mg Oral BID  . calcium carbonate  1,250 mg Oral BID WC  . cholecalciferol  1,000 Units Oral BID  . collagenase   Topical Daily  . diltiazem  300 mg Oral Daily  . divalproex  250 mg Oral Daily  . divalproex  750 mg Oral QHS  . doxazosin  4 mg Oral Daily  . ezetimibe  5 mg Oral QHS  . feeding supplement  1 Container Oral TID BM  . feeding supplement (ENSURE ENLIVE)  237 mL Oral TID BM  . feeding supplement (PRO-STAT SUGAR FREE 64)  30 mL Oral BID  . ferrous sulfate  325 mg Oral BID WC  . fluticasone  1 spray Each Nare Daily  . furosemide  40 mg Oral Daily  . gabapentin  300 mg Oral BID  . ipratropium-albuterol  3 mL Nebulization BID  . levothyroxine  150 mcg Oral QAC breakfast  . lidocaine  1 patch Transdermal Q24H  . metoprolol tartrate  25 mg Oral BID  . pantoprazole  40 mg Oral Daily  . pravastatin  40 mg Oral QHS  . predniSONE  10 mg Oral Q breakfast  . vitamin B-12  500 mcg Oral QHS   Continuous Infusions:  PRN Meds: acetaminophen, HYDROcodone-acetaminophen, RESOURCE THICKENUP CLEAR   Vital Signs    Vitals:   07/17/17 2115 07/18/17 0438 07/18/17 0859 07/18/17 0940  BP: 118/68 123/77 120/68   Pulse: 87 63 96   Resp: 16 18    Temp: 98.3 F (36.8 C) 98 F (36.7 C)    TempSrc: Oral Oral    SpO2: 96% 93%  96%  Weight:  161 lb 2.5 oz (73.1 kg)      Intake/Output Summary (Last 24 hours) at 07/18/2017 1011 Last data filed at 07/18/2017 9735 Gross per 24 hour  Intake 480 ml  Output 1900 ml  Net -1420 ml   Filed Weights   07/17/17 0800 07/18/17 0438  Weight: 170 lb 3.1 oz (77.2 kg) 161 lb 2.5 oz (73.1 kg)    Telemetry      Not on - Personally Reviewed  ECG     04/22, Atrial fib, HR 62, no acute changes - Personally Reviewed  Physical Exam   General: Well developed, well nourished, male appearing in no acute distress. Head: Normocephalic, atraumatic.  Neck: Supple without bruits, JVD not elevated. Lungs:  Resp regular and unlabored, CTA. Heart: Irreg R&R, S1, S2, no S3, S4, or murmur; no rub. Abdomen: Soft, non-tender, non-distended with normoactive bowel sounds. No hepatomegaly. No rebound/guarding. No obvious abdominal masses. Extremities: No clubbing, cyanosis, no edema. Distal pedal pulses are 2+ bilaterally. Neuro: Alert and oriented X 3. Moves all extremities spontaneously. Psych: Normal affect.  Labs    Hematology Recent Labs  Lab 07/16/17 [phone] 07/17/17 0628 07/18/17 0546  WBC 6.4 7.4 8.3  RBC 3.05* 3.04* 3.11*  HGB 9.1* 9.1* 9.2*  HCT 29.7* 29.4* 30.0*  MCV 97.4 96.7 96.5  MCH 29.8 29.9 29.6  MCHC 30.6 31.0 30.7  RDW 14.7 14.8 14.6  PLT 155 148* 167    Chemistry Recent Labs  Lab 07/16/17 0621 07/17/17 2426  07/18/17 0546  NA 139 142 140  K 3.9 3.7 3.7  CL 105 103 102  CO2 24 27 27   GLUCOSE 78 82 79  BUN 23* 31* 38*  CREATININE 1.18 1.37* 1.29*  CALCIUM 8.3* 8.4* 8.4*  GFRNONAA 56* 47* 50*  GFRAA >60 54* 58*  ANIONGAP 10 12 11       Radiology    Dg Chest 1 View  Result Date: 07/15/2017 CLINICAL DATA:  Status post right thoracentesis today. EXAM: CHEST  1 VIEW COMPARISON:  CT chest 07/14/2017. Single-view of the chest 07/12/2017. FINDINGS: No pneumothorax is identified after thoracentesis. Right pleural effusion is decreased. Bibasilar airspace disease and left pleural effusion are unchanged. IMPRESSION: Negative for pneumothorax after right thoracentesis. No change in bibasilar airspace disease in the left effusion. Electronically Signed   By: Inge Rise M.D.   On: 07/15/2017 12:32   Ct Chest Wo Contrast  Result Date: 07/14/2017 CLINICAL DATA:  Shortness of  breath, abnormal chest radiograph. EXAM: CT CHEST WITHOUT CONTRAST TECHNIQUE: Multidetector CT imaging of the chest was performed following the standard protocol without IV contrast. COMPARISON:  Chest radiograph 07/12/2017 and CT chest 07/06/2015. FINDINGS: Cardiovascular: Atherosclerotic calcification of the arterial vasculature, including extensive three-vessel involvement of the coronary arteries. Heart is mildly enlarged. No pericardial effusion. Mediastinum/Nodes: Mediastinal lymph nodes are not enlarged by CT size criteria. Hilar regions are difficult to evaluate without IV contrast. No axillary adenopathy. Esophagus is grossly unremarkable. Lungs/Pleura: Image quality is degraded by respiratory motion. Large right pleural effusion and moderate left pleural effusion with collapse/consolidation in both lower lobes. Airway is grossly unremarkable. Upper Abdomen: Visualized portion of the liver is grossly unremarkable. Small stones are seen in the gallbladder. Visualized portions of the adrenal glands are unremarkable. Low-attenuation lesions in the kidneys measure up to 7.4 cm on the left, incompletely imaged. High attenuation lesions in the left kidney measure up to 11 mm, too small to characterize. Visualized portions of the spleen, pancreas, stomach and bowel are grossly unremarkable. No upper abdominal adenopathy. Musculoskeletal: Bilateral rib fractures. Degenerative changes throughout the spine. No worrisome lytic or sclerotic lesions. IMPRESSION: 1. Large right and moderate left pleural effusions with collapse/consolidation in the adjacent lower lobes. 2. Aortic atherosclerosis (ICD10-170.0). Extensive three-vessel coronary artery calcification. 3. Cholelithiasis. Electronically Signed   By: Lorin Picket M.D.   On: 07/14/2017 18:01   US Thoracentesis Asp Pleural Space W/img Guide  Result Date: 07/15/2017 INDICATION: Patient with history of coronary artery disease, CHF, prior aspiration pneumonia,  bilateral pleural effusions, right greater than left. Request made for diagnostic and therapeutic right thoracentesis. EXAM: ULTRASOUND GUIDED DIAGNOSTIC AND THERAPEUTIC RIGHT THORACENTESIS MEDICATIONS: None COMPLICATIONS: None immediate. PROCEDURE: An ultrasound guided thoracentesis was thoroughly discussed with the patient and questions answered. The benefits, risks, alternatives and complications were also discussed. The patient understands and wishes to proceed with the procedure. Written consent was obtained. Ultrasound was performed to localize and mark an adequate pocket of fluid in the right chest. The area was then prepped and draped in the normal sterile fashion. 1% Lidocaine was used for local anesthesia. Under ultrasound guidance a 6 Fr Safe-T-Centesis catheter was introduced. Thoracentesis was performed. The catheter was removed and a dressing applied. FINDINGS: A total of approximately 1.1 liters of yellow fluid was removed. Samples were sent to the laboratory as requested by the clinical team. IMPRESSION: Successful ultrasound guided diagnostic and therapeutic right thoracentesis yielding 1.1 liters of pleural fluid. Follow-up chest x-ray revealed no pneumothorax. Read by: Rowe Robert,  PA-C Electronically Signed   By: Lucrezia Europe M.D.   On: 07/15/2017 12:36     Cardiac Studies   ECHO: 04/18/2017 - Left ventricle: The cavity size was normal. Wall thickness was increased in a pattern of moderate LVH. Systolic function was normal. The estimated ejection fraction was in the range of 60% to 65%. Wall motion was normal; there were no regional wall motion abnormalities. - Mitral valve: There was mild regurgitation. - Left atrium: The atrium was moderately to severely dilated. - Right atrium: The atrium was moderately dilated. - Pulmonary arteries: Systolic pressure was mildly to moderately increased. PA peak pressure: 37 mm Hg (S).  Patient Profile     84 y.o. male w/ hx RCA  PCI 2002,HTN, HLD, GERD, OA, vertigo,chronic right leg wound, seizures, memory loss, D-CHF & atrial fib dx 04/2017 on Eliquis w/ CHA2DS2-VASc = 5 (age x 2, CAD, HTN, CHF), dementia, hypothyroid, OSA not on CPAP, was admitted 05/02 for weakness, ?asp PNA, AMS, AKI w/ Cr 1.56. Cards saw 05/08 for CHF exacerbation/pleural effusions s/p 1.1 L removed by thoracentesis.  Assessment & Plan    1. Chronic diastolic CHF:  - pt wt down 9 lbs overnight, it was a bed wt. Have asked for a standing weight - on Lasix 40 mg po qd - BUN up but Cr trending down - emphasized that pt must drink at least 1 and up to 2 qts of all liquids daily, wife aware - will need to follow renal function closely. - has f/u appt 05/16 w/ Dr Oval Linsey - if wt and renal function stable tomorrow, ok for d/c  2. Persistent atrial fib - rate ok on Cardizem CD 300 mg qd and metoprolol 25 mg bid - anticoag w/ Eliquis 5 mg bid  Otherwise, per IM Principal Problem:   Acute hypoxemic respiratory failure (HCC) Active Problems:   Essential hypertension   Seizure disorder ([place])   Acute kidney injury ([place])   CKD (chronic kidney disease) stage 3, GFR 30-59 ml/min (HCC)   Acute metabolic encephalopathy   Acute encephalopathy   Chronic diastolic CHF (congestive heart failure) (HCC)   PAF (paroxysmal atrial fibrillation) ([place])   Aspiration syndrome, subsequent encounter   SignedRosaria Ferries , PA-C 10:11 AM 07/18/2017 Pager: [phone]  I have seen and examined the patient along with Rosaria Ferries , PA-C.  I have reviewed the chart, notes and new data.  I agree with PA/NP's note.  Key new complaints: Looks a lot more comfortable today even when lying almost fully flat. He was able to walk about 30 feet with physical therapy using a support to compensate for the weakness in his left leg.  He did not have any dizziness or shortness of breath while walking. Key examination changes: Edema has resolved, no JVD, no S3 rate well  controlled Key new findings / data: Renal parameters are stable, maybe even slightly improved since yesterday  PLAN: It appears that we can diurese him to a slightly lower volume than I had initially thought. I understand that he can be weighed with a large scale that accommodates a wheelchair at [facility].  Hopefully, by the time he gets home he will be strong enough to stand on a home scale.  I would suggest the following diuretic regimen, adjusted for weight since his oral intake has been relatively unpredictable:  - weight < 160 lb, do not give diuretic - weight 160-170 lb, furosemide 40 mg daily - weight >170 lb, furosemide 80 mg daily   (  with appropriate adjustments in KCl supplements -no potassium if not taking diuretic, double potassium if doubling diuretic)  Sanda Klein, MD, [facility] [phone] 07/18/2017, 12:33 PM

## 2017-07-19 LAB — CBC WITH DIFFERENTIAL/PLATELET
Basophils Absolute: 0 10*3/uL (ref 0.0–0.1)
Basophils Relative: 0 %
Eosinophils Absolute: 0.1 10*3/uL (ref 0.0–0.7)
Eosinophils Relative: 1 %
HCT: 30.1 % — ABNORMAL LOW (ref 39.0–52.0)
Hemoglobin: 9.4 g/dL — ABNORMAL LOW (ref 13.0–17.0)
Lymphocytes Relative: 15 %
Lymphs Abs: 1.3 10*3/uL (ref 0.7–4.0)
MCH: 30 pg (ref 26.0–34.0)
MCHC: 31.2 g/dL (ref 30.0–36.0)
MCV: 96.2 fL (ref 78.0–100.0)
Monocytes Absolute: 1.1 10*3/uL — ABNORMAL HIGH (ref 0.1–1.0)
Monocytes Relative: 13 %
Neutro Abs: 6 10*3/uL (ref 1.7–7.7)
Neutrophils Relative %: 71 %
Platelets: 177 10*3/uL (ref 150–400)
RBC: 3.13 MIL/uL — ABNORMAL LOW (ref 4.22–5.81)
RDW: 14.9 % (ref 11.5–15.5)
WBC: 8.5 10*3/uL (ref 4.0–10.5)

## 2017-07-19 LAB — BASIC METABOLIC PANEL
Anion gap: 12 (ref 5–15)
BUN: 38 mg/dL — ABNORMAL HIGH (ref 6–20)
CO2: 27 mmol/L (ref 22–32)
Calcium: 8.4 mg/dL — ABNORMAL LOW (ref 8.9–10.3)
Chloride: 103 mmol/L (ref 101–111)
Creatinine, Ser: 1.28 mg/dL — ABNORMAL HIGH (ref 0.61–1.24)
GFR calc Af Amer: 59 mL/min — ABNORMAL LOW (ref >60)
GFR calc non Af Amer: 51 mL/min — ABNORMAL LOW (ref >60)
Glucose, Bld: 79 mg/dL (ref 65–99)
Potassium: 3.6 mmol/L (ref 3.5–5.1)
Sodium: 142 mmol/L (ref 135–145)

## 2017-07-19 MED ORDER — HYDROCODONE-ACETAMINOPHEN 10-325 MG PO TABS: 1.0000 | ORAL_TABLET | Freq: Four times a day (QID) | ORAL | 0 refills | Status: DC | PRN

## 2017-07-19 MED ORDER — FUROSEMIDE 40 MG PO TABS: ORAL_TABLET | ORAL | 0 refills | Status: DC

## 2017-07-19 MED ORDER — POTASSIUM CHLORIDE ER 20 MEQ PO TBCR: EXTENDED_RELEASE_TABLET | ORAL | 0 refills | Status: DC

## 2017-07-19 NOTE — Discharge Summary (Addendum)
Physician Discharge Summary  [name] GHW:[id number] DOB: 11/05/1933 DOA: 07/11/2017  PCP: [name], MD  Admit date: 07/11/2017 Discharge date: 07/19/2017  Admitted From: Home Disposition:  SNF   Recommendations for Outpatient Follow-up:  1. Follow up with PCP in 1 week 2. Follow up with Cardiology as scheduled on 5/16  3. Please obtain BMP in 1 week while on diuretic therapy  4. Please weigh patient daily approximately around the same time, using same scale. Follow diuretic regimen as outlined below.  - weight < 160 lb, do not give diuretic, no KCl - weight 160-170 lb, furosemide 40 mg daily with KCl 40mEq - weight >170 lb, furosemide 80 mg daily with KCl 72mEq  Call [facility] Cardiology office with questions or concerns regarding weight gain or diuretic dosing.   Discharge Condition: Stable CODE STATUS: DNR   Diet recommendation: Heart healthy, low sodium, fluid restriction <2L  Dysphagia 3 (mechanical soft);Thin liquid Liquids provided via: Cup;Straw Medication Administration: Whole meds with puree Supervision: Patient able to self feed Compensations: Slow rate;Small sips/bites;Other (Comment)(start meals with liquids, drink liquids t/o meal) Postural Changes and/or Swallow Maneuvers: Seated upright 90 degrees;Upright 30-60 min after meal  Wound care:  RLE: Silver Hydrofiber dressing covered with sponge, wrapped with kerlix and Ace Wrap. Assess daily. LLE: Betadine to the areas of discoloration; allow to air dry; spiral wrap kerlex from just behind the toes to above the lower leg discoloration.  Change daily.  Also, to use Prevalon boots bilaterally at all times the patient is in the bed.   Brief/Interim Summary: [name] is an 84 y.o.malewith medical history significant for CAD, atrial fibrillation, seizure disorder, dementia, who presented to the ED withlethargy. Pt with multiple admission for sepsis secondary to recurrent aspiration pneumonia, about 3 times in  the past couple of months, last admission from 4/22-4/26. Since discharge, he has progressively become more lethargic and generally weak at home, unable to assist with his own care over past couple of days. Wife reported possible aspiration event prior to admission. In the ED, pt noted to have AKI, creat 1.5, BUN 30. CXR looks maybe slightly worse than 4/22 with BLL opacities and small B pleural effusions. Pt admitted for further management. Due to his right-sided pleural effusion, he underwent right thoracentesis on 5/6.  Cardiology was consulted for acute on chronic diastolic heart failure.  He was given IV diuretic and dosing changed to oral diuretic therapy.  Discharge Diagnoses:  Principal Problem:   Acute hypoxemic respiratory failure (HCC) Active Problems:   Essential hypertension   Seizure disorder (HCC)   Acute kidney injury ([place])   CKD (chronic kidney disease) stage 3, GFR 30-59 ml/min (HCC)   Acute metabolic encephalopathy   Acute encephalopathy   Acute on chronic diastolic heart failure (HCC)   PAF (paroxysmal atrial fibrillation) (HCC)   Aspiration syndrome, subsequent encounter  Acute hypoxic respiratory failure 2/2 right pleural effusion -ABG showed PO2 of 65.7 on 2L of O2 -CXR 5/2 showed possible consolidation or atelectasis in the lung bases with small bilateral pleural effusions appears to be increasing -CXR 5/3 showed cardiomegaly, diffuse pulmonary interstitial edema, small bilateral pleural effusions  -CT chest 5/5 showed large right and moderate left pleural effusions with collapse/consolidation in the adjacent lower lobes -S/p R thoracentesis 5/6 yielding 1.1L oftransudativefluid, gram stain negative, culture no growth thus far   Acute on chronic diastolic CHF -Echo EF 78-93%  -Cardiology consulted  -Daily weight, strict I/Os -Lasix decreased to PO. Discussed daily weight  and dosing lasix based on weight as above  -Follow up Cardiology on 6/06   Acute  metabolic encephalopathy -With mild dementia at baseline  -Resolved likely 2/2 hypoxia -Improved   Persistent A Fib -Rate controlled -Continue cardizem, metoprolol, eliquis  CKD stage 3 -Baseline Cr 1.1-1.3  -Cr stable   Iron deficiency anemia -Continue PO iron supplement -Hgb stable   Dysphagia -SLP consulted, currently on dysphagia 3 diet   Seizure disorder -Continue depakote  Hypothyroidism -Continue synthroid  HLD -Continue pravachol   GERD -Continue PPI   Chronic RLE wound -Wound care consult  Chronic steroid use (has been on it for years ??s/p knee surgery) -Continue prednisone    Discharge Instructions  Discharge Instructions    (HEART FAILURE PATIENTS) Call MD:  Anytime you have any of the following symptoms: 1) 3 pound weight gain in 24 hours or 5 pounds in 1 week 2) shortness of breath, with or without a dry hacking cough 3) swelling in the hands, feet or stomach 4) if you have to sleep on extra pillows at night in order to breathe.   Complete by:  As directed    AMB Referral to [facility]   Complete by:  As directed    Please assign to [facility] LCSW for follow up at [facility]- SNF. To dc to SNF on 07/19/17. Written consent obtained. Wife- Suren Payne primary contact (cell) [phone] and (home) [phone]. Please refer to [facility] upon dc from SNF. Please see liaison notes as well from 07/18/17. Please call with questions. Thanks.[name], [place], [facility] Liaison [phone]   Reason for consult:  Please assign to [facility] LCSW   Diagnoses of:  Other   Other Diagnosis:  HTN, afib, dementia   Expected date of contact:  1-3 days (reserved for hospital discharges)   Call MD for:  difficulty breathing, headache or visual disturbances   Complete by:  As directed    Call MD for:  extreme fatigue   Complete by:  As directed    Call MD for:  hives   Complete by:  As directed    Call MD for:   persistant dizziness or light-headedness   Complete by:  As directed    Call MD for:  persistant nausea and vomiting   Complete by:  As directed    Call MD for:  severe uncontrolled pain   Complete by:  As directed    Call MD for:  temperature >100.4   Complete by:  As directed    Diet - low sodium heart healthy   Complete by:  As directed    Discharge instructions   Complete by:  As directed    You were cared for by a hospitalist during your hospital stay. If you have any questions about your discharge medications or the care you received while you were in the hospital after you are discharged, you can call the unit and ask to speak with the hospitalist on call if the hospitalist that took care of you is not available. Once you are discharged, your primary care physician will handle any further medical issues. Please note that NO REFILLS for any discharge medications will be authorized once you are discharged, as it is imperative that you return to your primary care physician (or establish a relationship with a primary care physician if you do not have one) for your aftercare needs so that they can reassess your need for medications and monitor your lab values.  Increase activity slowly   Complete by:  As directed      Allergies as of 07/19/2017      Reactions   Demerol [meperidine] Nausea And Vomiting   Keppra [levetiracetam] Other (See Comments)   Causes sleepiness      Medication List    TAKE these medications   acetaminophen 325 MG tablet Commonly known as:  TYLENOL Take 650 mg by mouth every 4 (four) hours as needed for mild pain.   alendronate 70 MG tablet Commonly known as:  FOSAMAX Take 70 mg by mouth once a week.   apixaban 5 MG Tabs tablet Commonly known as:  ELIQUIS Take 1 tablet (5 mg total) by mouth 2 (two) times daily.   calcium carbonate 1250 (500 Ca) MG tablet Commonly known as:  OS-CAL - dosed in mg of elemental calcium Take 1 tablet (1,250 mg total) by  mouth 2 (two) times daily with a meal.   collagenase ointment Commonly known as:  SANTYL Apply topically daily.   CoQ10 200 MG Caps Take 200 mg by mouth at bedtime.   diltiazem 300 MG 24 hr capsule Commonly known as:  CARDIZEM CD Take 1 capsule (300 mg total) by mouth daily.   divalproex 250 MG DR tablet Commonly known as:  DEPAKOTE Take 3 tablets (750 mg total) by mouth every 12 (twelve) hours. What changed:    how much to take  when to take this  additional instructions   docusate sodium 100 MG capsule Commonly known as:  COLACE Take 200 mg by mouth 2 (two) times daily.   doxazosin 4 MG tablet Commonly known as:  CARDURA Take 1 tablet (4 mg total) by mouth daily.   feeding supplement (ENSURE ENLIVE) Liqd Take 237 mLs by mouth 3 (three) times daily between meals.   feeding supplement (PRO-STAT SUGAR FREE 64) Liqd Take 30 mLs by mouth 2 (two) times daily.   ferrous sulfate 325 (65 FE) MG tablet Take 1 tablet (325 mg total) by mouth 2 (two) times daily with a meal.   furosemide 40 MG tablet Commonly known as:  LASIX Weight daily. If weight < 160 lb, do not give diuretic. If weight 160-170 lb, furosemide 40 mg daily. If weight >170 lb, furosemide 80 mg daily. What changed:    medication strength  how much to take  how to take this  when to take this  additional instructions   gabapentin 300 MG capsule Commonly known as:  NEURONTIN Take 300 mg by mouth 2 (two) times daily.   HYDROcodone-acetaminophen 10-325 MG tablet Commonly known as:  NORCO Take 1 tablet by mouth every 6 (six) hours as needed for moderate pain.   levothyroxine 150 MCG tablet Commonly known as:  SYNTHROID, LEVOTHROID Take 150 mcg by mouth daily before breakfast.   lidocaine 5 % Commonly known as:  LIDODERM Place 1 patch onto the skin daily as needed. Remove & Discard patch within 12 hours or as directed by MD   metoprolol tartrate 25 MG tablet Commonly known as:  LOPRESSOR Take  1 tablet (25 mg total) by mouth 2 (two) times daily.   multivitamin with minerals Tabs tablet Take 1 tablet by mouth at bedtime.   nitroGLYCERIN 0.4 MG SL tablet Commonly known as:  NITROSTAT Place 0.4 mg under the tongue every 5 (five) minutes as needed for chest pain. Reported on 07/20/2015   omeprazole 20 MG capsule Commonly known as:  PRILOSEC Take 20 mg by mouth 2 (two) times daily before  a meal.   Potassium Chloride ER 20 MEQ Tbcr Commonly known as:  KLOR-CON 10 Take only when taking lasix. Take 67mEq when taking Lasix 40mg . Take 22mEq when taking Lasix 80mg . Lasix dosing will be based on your daily weight.   pravastatin 40 MG tablet Commonly known as:  PRAVACHOL Take 40 mg by mouth at bedtime.   predniSONE 10 MG tablet Commonly known as:  DELTASONE Take 1 tablet (10 mg total) by mouth daily with breakfast.   senna 8.6 MG tablet Commonly known as:  SENOKOT Take 1 tablet by mouth 2 (two) times daily.   vitamin A 8000 UNIT capsule Take 8,000 Units by mouth 2 (two) times daily.   vitamin B-12 500 MCG tablet Commonly known as:  CYANOCOBALAMIN Take 500 mcg by mouth at bedtime.   vitamin C 500 MG tablet Commonly known as:  ASCORBIC ACID Take 500 mg by mouth every morning.   Vitamin D-3 1000 units Caps Take 1,000 Units by mouth 2 (two) times daily.   ZETIA 10 MG tablet Generic drug:  ezetimibe Take 5 mg by mouth at bedtime.   zinc sulfate 220 (50 Zn) MG capsule Take 1 capsule (220 mg total) by mouth every morning.      Follow-up Information    [name], MD. Schedule an appointment as soon as possible for a visit in 1 week(s).   Specialty:  Internal Medicine Contact information: [address] [place] [address] [phone]        Barrett, Evelene Croon, PA-C. Go on 07/25/2017.   Specialties:  Cardiology, Radiology Contact information: [address] [place] [address] [phone]          Allergies  Allergen Reactions  .  Demerol [Meperidine] Nausea And Vomiting  . Keppra [Levetiracetam] Other (See Comments)    Causes sleepiness    Consultations:  Cardiology    Procedures/Studies: Dg Chest 1 View  Result Date: 07/15/2017 CLINICAL DATA:  Status post right thoracentesis today. EXAM: CHEST  1 VIEW COMPARISON:  CT chest 07/14/2017. Single-view of the chest 07/12/2017. FINDINGS: No pneumothorax is identified after thoracentesis. Right pleural effusion is decreased. Bibasilar airspace disease and left pleural effusion are unchanged. IMPRESSION: Negative for pneumothorax after right thoracentesis. No change in bibasilar airspace disease in the left effusion. Electronically Signed   By: Inge Rise M.D.   On: 07/15/2017 12:32   Dg Chest 2 View  Result Date: 07/01/2017 CLINICAL DATA:  Altered mental status, and confusion. EXAM: CHEST - 2 VIEW COMPARISON:  05/27/2017. FINDINGS: The heart is enlarged. BILATERAL lower lobe pulmonary opacities have progressed from prior radiograph, likely represent combination of atelectasis, effusion, and infiltrate. Old RIGHT-sided rib fractures are stable. Worsening aeration from priors. IMPRESSION: Worsening aeration. Cardiomegaly. Lower lobe opacities are increased. Electronically Signed   By: Staci Righter M.D.   On: 07/01/2017 15:52   Ct Head Wo Contrast  Result Date: 07/01/2017 CLINICAL DATA:  Patient coming from home with c/o altered mental status. Patient wife call EMS stated patient been very hard to arouse. Pt did wake up little but was confused. Per ems pt been alert and oriented x 4. EXAM: CT HEAD WITHOUT CONTRAST TECHNIQUE: Contiguous axial images were obtained from the base of the skull through the vertex without intravenous contrast. COMPARISON:  04/17/2017 FINDINGS: Brain: No evidence of acute infarction, hemorrhage, extra-axial collection, ventriculomegaly, or mass effect. Generalized cerebral atrophy. Periventricular white matter low attenuation likely secondary to  microangiopathy. Vascular: Cerebrovascular atherosclerotic calcifications are noted. Skull:  Negative for fracture or focal lesion. Sinuses/Orbits: Visualized portions of the orbits are unremarkable. Visualized portions of the paranasal sinuses and mastoid air cells are unremarkable. Other: None. IMPRESSION: 1. No acute intracranial pathology. 2. Chronic microvascular disease and cerebral atrophy. Electronically Signed   By: Kathreen Devoid   On: 07/01/2017 15:51   Ct Chest Wo Contrast  Result Date: 07/14/2017 CLINICAL DATA:  Shortness of breath, abnormal chest radiograph. EXAM: CT CHEST WITHOUT CONTRAST TECHNIQUE: Multidetector CT imaging of the chest was performed following the standard protocol without IV contrast. COMPARISON:  Chest radiograph 07/12/2017 and CT chest 07/06/2015. FINDINGS: Cardiovascular: Atherosclerotic calcification of the arterial vasculature, including extensive three-vessel involvement of the coronary arteries. Heart is mildly enlarged. No pericardial effusion. Mediastinum/Nodes: Mediastinal lymph nodes are not enlarged by CT size criteria. Hilar regions are difficult to evaluate without IV contrast. No axillary adenopathy. Esophagus is grossly unremarkable. Lungs/Pleura: Image quality is degraded by respiratory motion. Large right pleural effusion and moderate left pleural effusion with collapse/consolidation in both lower lobes. Airway is grossly unremarkable. Upper Abdomen: Visualized portion of the liver is grossly unremarkable. Small stones are seen in the gallbladder. Visualized portions of the adrenal glands are unremarkable. Low-attenuation lesions in the kidneys measure up to 7.4 cm on the left, incompletely imaged. High attenuation lesions in the left kidney measure up to 11 mm, too small to characterize. Visualized portions of the spleen, pancreas, stomach and bowel are grossly unremarkable. No upper abdominal adenopathy. Musculoskeletal: Bilateral rib fractures. Degenerative  changes throughout the spine. No worrisome lytic or sclerotic lesions. IMPRESSION: 1. Large right and moderate left pleural effusions with collapse/consolidation in the adjacent lower lobes. 2. Aortic atherosclerosis (ICD10-170.0). Extensive three-vessel coronary artery calcification. 3. Cholelithiasis. Electronically Signed   By: Lorin Picket M.D.   On: 07/14/2017 18:01   Dg Chest Port 1 View  Result Date: 07/12/2017 CLINICAL DATA:  Initial evaluation for acute shortness of breath, dyspnea, question aspiration. EXAM: PORTABLE CHEST 1 VIEW COMPARISON:  Prior radiograph from 07/11/2017. FINDINGS: Cardiomegaly, stable from previous. Mediastinal silhouette within normal limits. Lungs are hypoinflated. Diffuse vascular congestion with interstitial prominence consistent with pulmonary interstitial edema, slightly improved from previous. Bilateral pleural effusions with associated bibasilar opacities, which may reflect atelectasis or infiltrates. Overall appearance is stable to slightly improved from previous with no new focal infiltrates to suggest interval aspiration. No pneumothorax. Osseous structures unchanged. IMPRESSION: Cardiomegaly with diffuse pulmonary interstitial edema and small bilateral pleural effusions associated bibasilar opacities may reflect atelectasis and/or infiltrates. Overall, appearance is stable to slightly improved relative to previous, with no new focal infiltrates to suggest interval aspiration identified. Electronically Signed   By: Jeannine Boga M.D.   On: 07/12/2017 17:15   Dg Chest Port 1 View  Result Date: 07/11/2017 CLINICAL DATA:  Shortness of breath.  Choking. EXAM: PORTABLE CHEST 1 VIEW COMPARISON:  07/01/2017 FINDINGS: Shallow inspiration. Consolidation or atelectasis in the lung bases with small bilateral pleural effusions. Probable increased since previous study although comparison is difficult due to differences in technique. Cardiac enlargement. Degenerative  changes in the spine. IMPRESSION: Consolidation or atelectasis in the lung bases with small bilateral pleural effusions appears to be increasing. Cardiac enlargement. Electronically Signed   By: Lucienne Capers M.D.   On: 07/11/2017 04:05   Dg Swallowing Func-speech Pathology  Result Date: 07/02/2017 Objective Swallowing Evaluation: Type of Study: MBS-Modified Barium Swallow Study  Patient Details Name: [name] MRN: [id number] Date of Birth: [birth year] Today's Date: 07/02/2017 Time: SLP Start Time (ACUTE  ONLY): 1127 -SLP Stop Time (ACUTE ONLY): 1210 SLP Time Calculation (min) (ACUTE ONLY): 43 min Past Medical History: Past Medical History: Diagnosis Date . Abnormality of gait 09/22/2015 . Arthritis  . Atrial fibrillation ([place])  . CAD (coronary artery disease)   Stent to RCA, Penta stent, 99% reduced to 0% 2002. . Cancer ([place])   skin CA removed from back . Chronic insomnia 03/30/2015 . Complication of anesthesia   trouble waking up . Constipation   from medications taken . GERD (gastroesophageal reflux disease)  . Hypercholesteremia  . Hypertension  . Hypothyroidism  . Memory difficulty 09/22/2015 . Osteoarthritis  . Seizures ([place])  . Sepsis ([place]) 05/2017 . Transient alteration of awareness 03/30/2015 . Vertigo   hx of Past Surgical History: Past Surgical History: Procedure Laterality Date . BACK SURGERY   . EYE SURGERY    Bilateral Cataract surgery  . HERNIA REPAIR   . I&D EXTREMITY Right 05/10/2015  Procedure: IRRIGATION AND DEBRIDEMENT EXTREMITY;  Surgeon: Leanora Cover, MD;  Location: [facility];  Service: Orthopedics;  Laterality: Right; . KNEE ARTHROPLASTY    right knee X 2; left knee once . LAMINECTOMY    X 6 . POSTERIOR CERVICAL FUSION/FORAMINOTOMY  01/28/2012  Procedure: POSTERIOR CERVICAL FUSION/FORAMINOTOMY LEVEL 3;  Surgeon: [name], MD;  Location: [facility];  Service: Neurosurgery;  Laterality: Left;  Posterior Cervical Five-Thoracic One Fusion, Arthrodesis with LEFT  Cervical Seven-thoracic One Laminectomy, Foraminotomy and Resection of Synovial Cyst . POSTERIOR CERVICAL FUSION/FORAMINOTOMY N/A 01/29/2013  Procedure: POSTERIOR CERVICAL FUSION/FORAMINOTOMY LEVEL 1 and C2-5 Posteriolateral Arthrodesis;  Surgeon: [name], MD;  Location: [facility];  Service: Neurosurgery;  Laterality: N/A;  C2-C3 Laminectomy C2-C3 posterior cervical arthrodesis . TONSILLECTOMY   HPI: [name] is a 84 y.o. male with medical history significant of HTN, HLD, diastolic CHF, atrial fibrillation on chronic anticoagulation, seizures, hypothyroidism, dementia, chronic lower extremity wound, and GERD; who presents from rehab after being found to have fever with altered mental status.  Pt found to be septic secondary to pna vs wound infection. CXR shows Left lung base atelectasis versus infiltrate increased.  Pt also with h/o multiple spine surgeries- posterior C5-T1 2013 and C2-C5 01/2013.  Swallow eval reordered.  Advised to proceed with MBS since prior clinical evals negative and pt continues with admits for pna.  Pt has not had an MBS in the past and his dementia, and spinal surgeries may impact sensorimotor abilities.   Subjective: pt awake in chair Assessment / Plan / Recommendation CHL IP CLINICAL IMPRESSIONS 07/02/2017 Clinical Impression Suboptimal view of MBS due to pt's shoulders blocking some portion of pharynx/larynx and trachea.  Mild oropharyngeal dysphagia without aspiration or penetration of any consistency tested.   Pt did have decreased oral coordination/propulsion with barium tablet taken with thin resulting in barium tablet precariously lodging at vallecular space.  Pt was cued NOT to talk and provided with more thin to aid transit.  Tablet then lodged at proximal esophagus (near area of hardware) with minimal backflow of tablet, however pt able to clear with reflexive further swallow.  Suspect primary aspiration risk is due to pharyngo-cervical esophageal and probable  esophageal dysphagia.  Recommend pill be given with pudding *start and follow with liquids* to decrease aspiration risk.  Pt did appear with marginally slow clearance of esophagus, liquids appeared to aid.  Pt also is kyphotic - and suspect this contributes to his deficits.  Using video monitor and teach back, educated pt and wife to  findings/recommendations.  Of note, wife requested barium swallow to examine esophagus.   SLP Visit Diagnosis Dysphagia, pharyngoesophageal phase (R13.14);Dysphagia, oropharyngeal phase (R13.12) Attention and concentration deficit following -- Frontal lobe and executive function deficit following -- Impact on safety and function Mild aspiration risk   CHL IP TREATMENT RECOMMENDATION 07/02/2017 Treatment Recommendations Therapy as outlined in treatment plan below   Prognosis 07/02/2017 Prognosis for Safe Diet Advancement Good Barriers to Reach Goals Cognitive deficits Barriers/Prognosis Comment -- CHL IP DIET RECOMMENDATION 07/02/2017 SLP Diet Recommendations Dysphagia 3 (Mech soft) solids;Thin liquid Liquid Administration via Cup;Straw Medication Administration Whole meds with puree Compensations Slow rate;Small sips/bites;Follow solids with liquid Postural Changes Seated upright at 90 degrees;Remain semi-upright after after feeds/meals (Comment)   CHL IP OTHER RECOMMENDATIONS 07/02/2017 Recommended Consults Other (Comment) Oral Care Recommendations Oral care BID Other Recommendations --   CHL IP FOLLOW UP RECOMMENDATIONS 05/26/2017 Follow up Recommendations Skilled Nursing facility   [facility] IP FREQUENCY AND DURATION 07/02/2017 Speech Therapy Frequency (ACUTE ONLY) min 1 x/week Treatment Duration 1 week      CHL IP ORAL PHASE 07/02/2017 Oral Phase Impaired Oral - Pudding Teaspoon -- Oral - Pudding Cup -- Oral - Honey Teaspoon -- Oral - Honey Cup -- Oral - Nectar Teaspoon Delayed oral transit;Weak lingual manipulation Oral - Nectar Cup -- Oral - Nectar Straw Delayed oral transit;Weak lingual  manipulation Oral - Thin Teaspoon Delayed oral transit;Weak lingual manipulation Oral - Thin Cup -- Oral - Thin Straw Delayed oral transit;Decreased bolus cohesion;Weak lingual manipulation Oral - Puree Delayed oral transit;Decreased bolus cohesion;Weak lingual manipulation;Piecemeal swallowing Oral - Mech Soft -- Oral - Regular Decreased bolus cohesion;Weak lingual manipulation;Piecemeal swallowing Oral - Multi-Consistency -- Oral - Pill Decreased bolus cohesion;Weak lingual manipulation Oral Phase - Comment --  CHL IP PHARYNGEAL PHASE 07/02/2017 Pharyngeal Phase Impaired Pharyngeal- Pudding Teaspoon -- Pharyngeal -- Pharyngeal- Pudding Cup -- Pharyngeal -- Pharyngeal- Honey Teaspoon -- Pharyngeal -- Pharyngeal- Honey Cup -- Pharyngeal -- Pharyngeal- Nectar Teaspoon WFL Pharyngeal -- Pharyngeal- Nectar Cup -- Pharyngeal -- Pharyngeal- Nectar Straw WFL;Pharyngeal residue - valleculae Pharyngeal -- Pharyngeal- Thin Teaspoon WFL Pharyngeal -- Pharyngeal- Thin Cup -- Pharyngeal -- Pharyngeal- Thin Straw WFL;Pharyngeal residue - valleculae Pharyngeal -- Pharyngeal- Puree WFL Pharyngeal -- Pharyngeal- Mechanical Soft -- Pharyngeal -- Pharyngeal- Regular WFL Pharyngeal -- Pharyngeal- Multi-consistency -- Pharyngeal -- Pharyngeal- Pill Reduced tongue base retraction;Reduced epiglottic inversion;Pharyngeal residue - valleculae Pharyngeal -- Pharyngeal Comment barium tablet precariously lodged at vallecular space transiting to proximal esophagus with further, next appeared to lodge in at cervical esophagus - recommend pill be given with pudding *start and follow with liquids*  CHL IP CERVICAL ESOPHAGEAL PHASE 07/02/2017 Cervical Esophageal Phase Impaired Pudding Teaspoon -- Pudding Cup -- Honey Teaspoon -- Honey Cup -- Nectar Teaspoon WFL Nectar Cup -- Nectar Straw WFL Thin Teaspoon WFL Thin Cup -- Thin Straw WFL Puree WFL Mechanical Soft -- Regular WFL Multi-consistency -- Pill Prominent cricopharyngeal segment;Reduced  cricopharyngeal relaxation Cervical Esophageal Comment suspect decreased CP relaxation and (? impact of cervical spine hardware) prompting momentary halt of barium tablet at proximal esophagus, cleared with further reflexive swallows, appearance of prominent cricopharyngeus  Luanna Salk, MS [facility] SLP [phone] Macario Golds 07/02/2017, 12:40 PM              US Thoracentesis Asp Pleural Space W/img Guide  Result Date: 07/15/2017 INDICATION: Patient with history of coronary artery disease, CHF, prior aspiration pneumonia, bilateral pleural effusions, right greater than left. Request made for diagnostic and therapeutic right thoracentesis. EXAM: ULTRASOUND  GUIDED DIAGNOSTIC AND THERAPEUTIC RIGHT THORACENTESIS MEDICATIONS: None COMPLICATIONS: None immediate. PROCEDURE: An ultrasound guided thoracentesis was thoroughly discussed with the patient and questions answered. The benefits, risks, alternatives and complications were also discussed. The patient understands and wishes to proceed with the procedure. Written consent was obtained. Ultrasound was performed to localize and mark an adequate pocket of fluid in the right chest. The area was then prepped and draped in the normal sterile fashion. 1% Lidocaine was used for local anesthesia. Under ultrasound guidance a 6 Fr Safe-T-Centesis catheter was introduced. Thoracentesis was performed. The catheter was removed and a dressing applied. FINDINGS: A total of approximately 1.1 liters of yellow fluid was removed. Samples were sent to the laboratory as requested by the clinical team. IMPRESSION: Successful ultrasound guided diagnostic and therapeutic right thoracentesis yielding 1.1 liters of pleural fluid. Follow-up chest x-ray revealed no pneumothorax. Read by: Rowe Robert, PA-C Electronically Signed   By: Lucrezia Europe M.D.   On: 07/15/2017 12:36       Discharge Exam: Vitals:   07/19/17 0425 07/19/17 0855  BP: 130/76   Pulse: 67 89  Resp: 16 16  Temp: 97.8  F (36.6 C)   SpO2: 99% 97%    General: Pt is alert, awake, not in acute distress Eyes: bilateral sclera without injection, some tearing from left eye without evidence of infection or inflammation  Cardiovascular: S1/S2 +, no rubs, no gallops, no pedal edema  Respiratory: CTA bilaterally, no wheezing, no rhonchi Abdominal: Soft, NT, ND, bowel sounds + Extremities: no edema, no cyanosis    The results of significant diagnostics from this hospitalization (including imaging, microbiology, ancillary and laboratory) are listed below for reference.     Microbiology: Recent Results (from the past 240 hour(s))  Culture, blood (routine x 2) Call MD if unable to obtain prior to antibiotics being given     Status: None   Collection Time: 07/11/17  8:31 AM  Result Value Ref Range Status   Specimen Description   Final    BLOOD RIGHT ANTECUBITAL Performed at [facility] [address] [place], [place] [address]    Special Requests   Final    BOTTLES DRAWN AEROBIC ONLY Blood Culture adequate volume Performed at [facility] [address] [place], [place] [address]    Culture   Final    NO GROWTH 5 DAYS Performed at [facility], [place] [address], [place], [place] [address]    Report Status 07/16/2017 FINAL  Final  Culture, blood (routine x 2) Call MD if unable to obtain prior to antibiotics being given     Status: None   Collection Time: 07/11/17  8:43 AM  Result Value Ref Range Status   Specimen Description   Final    BLOOD LEFT HAND Performed at [facility] [address], [place], [place] [address]    Special Requests   Final    BOTTLES DRAWN AEROBIC AND ANAEROBIC Blood Culture adequate volume Performed at [facility] [address], [place], [place] [address]    Culture   Final    NO GROWTH 5 DAYS Performed at [facility], [place] [address] [place], [place] [address]    Report Status  07/16/2017 FINAL  Final  Culture, Urine     Status: Abnormal   Collection Time: 07/11/17  4:34 PM  Result Value Ref Range Status   Specimen Description   Final    URINE, RANDOM Performed  at [facility], [place] [address], [place], [place] [address]    Culture MULTIPLE SPECIES PRESENT, SUGGEST RECOLLECTION (A)  Final   Report Status 07/12/2017 FINAL  Final  Culture, Urine     Status: Abnormal   Collection Time: 07/13/17 12:30 AM  Result Value Ref Range Status   Specimen Description   Final    URINE, RANDOM Performed at [facility] [address] [place], [place] [address]    Special Requests   Final    NONE Performed at [facility], [place] [address] [place], [place] [address]    Culture 20,000 COLONIES/mL ESCHERICHIA COLI (A)  Final   Report Status 07/15/2017 FINAL  Final   Organism ID, Bacteria ESCHERICHIA COLI (A)  Final      Susceptibility   Escherichia coli - MIC*    AMPICILLIN <=2 SENSITIVE Sensitive     CEFAZOLIN <=4 SENSITIVE Sensitive     CEFTRIAXONE <=1 SENSITIVE Sensitive     CIPROFLOXACIN <=0.25 SENSITIVE Sensitive     GENTAMICIN <=1 SENSITIVE Sensitive     IMIPENEM <=0.25 SENSITIVE Sensitive     NITROFURANTOIN <=16 SENSITIVE Sensitive     TRIMETH/SULFA <=20 SENSITIVE Sensitive     AMPICILLIN/SULBACTAM <=2 SENSITIVE Sensitive     PIP/TAZO <=4 SENSITIVE Sensitive     Extended ESBL NEGATIVE Sensitive     * 20,000 COLONIES/mL ESCHERICHIA COLI  Culture, body fluid-bottle     Status: None (Preliminary result)   Collection Time: 07/15/17 12:40 PM  Result Value Ref Range Status   Specimen Description PLEURAL  Final   Special Requests RIGHT  Final   Culture   Final    NO GROWTH 4 DAYS Performed at [facility], [address] [place], [place] [address]    Report Status PENDING  Incomplete  Gram stain     Status: None   Collection Time: 07/15/17 12:40 PM  Result Value Ref Range Status   Specimen  Description PLEURAL  Final   Special Requests RIGHT  Final   Gram Stain   Final    WBC PRESENT,BOTH PMN AND MONONUCLEAR NO ORGANISMS SEEN Performed at [facility], [address], [place], [place] [address]    Report Status 07/15/2017 FINAL  Final     Labs: BNP (last 3 results) Recent Labs    07/01/17 1526 07/11/17 0346  BNP 345.9* 510.2*   Basic Metabolic Panel: Recent Labs  Lab 07/15/17 0545 07/16/17 0621 07/17/17 0628 07/18/17 0546 07/19/17 0551  NA 142 139 142 140 142  K 3.7 3.9 3.7 3.7 3.6  CL 107 105 103 102 103  CO2 26 24 27 27 27   GLUCOSE 87 78 82 79 79  BUN 20 23* 31* 38* 38*  CREATININE 1.07 1.18 1.37* 1.29* 1.28*  CALCIUM 8.2* 8.3* 8.4* 8.4* 8.4*   Liver Function Tests: No results for input(s): AST, ALT, ALKPHOS, BILITOT, PROT, ALBUMIN in the last 168 hours. No results for input(s): LIPASE, AMYLASE in the last 168 hours. No results for input(s): AMMONIA in the last 168 hours. CBC: Recent Labs  Lab 07/15/17 0545 07/16/17 0621 07/17/17 0628 07/18/17 0546 07/19/17 0551  WBC 5.8 6.4 7.4 8.3 8.5  NEUTROABS 4.3 4.7 5.4 6.1 6.0  HGB 9.0* 9.1* 9.1* 9.2* 9.4*  HCT 29.2* 29.7* 29.4* 30.0* 30.1*  MCV 97.7 97.4 96.7 96.5 96.2  PLT 145* 155 148* 167 177   Cardiac Enzymes: No results for input(s): CKTOTAL, CKMB, CKMBINDEX, TROPONINI in the  last 168 hours. BNP: Invalid input(s): POCBNP CBG: No results for input(s): GLUCAP in the last 168 hours. D-Dimer No results for input(s): DDIMER in the last 72 hours. Hgb A1c No results for input(s): HGBA1C in the last 72 hours. Lipid Profile No results for input(s): CHOL, HDL, LDLCALC, TRIG, CHOLHDL, LDLDIRECT in the last 72 hours. Thyroid function studies No results for input(s): TSH, T4TOTAL, T3FREE, THYROIDAB in the last 72 hours.  Invalid input(s): FREET3 Anemia work up No results for input(s): VITAMINB12, FOLATE, FERRITIN, TIBC, IRON, RETICCTPCT in the last 72 hours. Urinalysis    Component Value  Date/Time   COLORURINE YELLOW 07/11/2017 0346   APPEARANCEUR CLEAR 07/11/2017 0346   LABSPEC 1.015 07/11/2017 0346   PHURINE 5.0 07/11/2017 0346   GLUCOSEU NEGATIVE 07/11/2017 0346   HGBUR NEGATIVE 07/11/2017 0346   BILIRUBINUR NEGATIVE 07/11/2017 0346   KETONESUR 5 (A) 07/11/2017 0346   PROTEINUR NEGATIVE 07/11/2017 0346   UROBILINOGEN 1.0 09/16/2007 1212   NITRITE NEGATIVE 07/11/2017 0346   LEUKOCYTESUR NEGATIVE 07/11/2017 0346   Sepsis Labs Invalid input(s): PROCALCITONIN,  WBC,  LACTICIDVEN Microbiology Recent Results (from the past 240 hour(s))  Culture, blood (routine x 2) Call MD if unable to obtain prior to antibiotics being given     Status: None   Collection Time: 07/11/17  8:31 AM  Result Value Ref Range Status   Specimen Description   Final    BLOOD RIGHT ANTECUBITAL Performed at [facility], [place] [address], [place], [place] [address]    Special Requests   Final    BOTTLES DRAWN AEROBIC ONLY Blood Culture adequate volume Performed at [facility] [address] [place], [place] [address]    Culture   Final    NO GROWTH 5 DAYS Performed at [facility], [place] [address], [place], [place] [address]    Report Status 07/16/2017 FINAL  Final  Culture, blood (routine x 2) Call MD if unable to obtain prior to antibiotics being given     Status: None   Collection Time: 07/11/17  8:43 AM  Result Value Ref Range Status   Specimen Description   Final    BLOOD LEFT HAND Performed at [facility] [address] [place], [place] [address]    Special Requests   Final    BOTTLES DRAWN AEROBIC AND ANAEROBIC Blood Culture adequate volume Performed at [facility] [address] [place], [place] [address]    Culture   Final    NO GROWTH 5 DAYS Performed at [facility], [place] [address], [place], [place] [address]    Report Status 07/16/2017 FINAL  Final  Culture, Urine     Status:  Abnormal   Collection Time: 07/11/17  4:34 PM  Result Value Ref Range Status   Specimen Description   Final    URINE, RANDOM Performed at [facility] [address] [place], [place] [address]    Culture MULTIPLE SPECIES PRESENT, SUGGEST RECOLLECTION (A)  Final   Report Status 07/12/2017 FINAL  Final  Culture, Urine     Status: Abnormal   Collection Time: 07/13/17 12:30 AM  Result Value Ref Range Status   Specimen Description   Final    URINE, RANDOM Performed at [facility] [address] [place], [place] [address]    Special Requests   Final    NONE Performed at [facility], [place] [address], [place], [place] [address]  Culture 20,000 COLONIES/mL ESCHERICHIA COLI (A)  Final   Report Status 07/15/2017 FINAL  Final   Organism ID, Bacteria ESCHERICHIA COLI (A)  Final      Susceptibility   Escherichia coli - MIC*    AMPICILLIN <=2 SENSITIVE Sensitive     CEFAZOLIN <=4 SENSITIVE Sensitive     CEFTRIAXONE <=1 SENSITIVE Sensitive     CIPROFLOXACIN <=0.25 SENSITIVE Sensitive     GENTAMICIN <=1 SENSITIVE Sensitive     IMIPENEM <=0.25 SENSITIVE Sensitive     NITROFURANTOIN <=16 SENSITIVE Sensitive     TRIMETH/SULFA <=20 SENSITIVE Sensitive     AMPICILLIN/SULBACTAM <=2 SENSITIVE Sensitive     PIP/TAZO <=4 SENSITIVE Sensitive     Extended ESBL NEGATIVE Sensitive     * 20,000 COLONIES/mL ESCHERICHIA COLI  Culture, body fluid-bottle     Status: None (Preliminary result)   Collection Time: 07/15/17 12:40 PM  Result Value Ref Range Status   Specimen Description PLEURAL  Final   Special Requests RIGHT  Final   Culture   Final    NO GROWTH 4 DAYS Performed at [facility], [address] [place], [place] [address]    Report Status PENDING  Incomplete  Gram stain     Status: None   Collection Time: 07/15/17 12:40 PM  Result Value Ref Range Status   Specimen Description PLEURAL  Final   Special Requests RIGHT   Final   Gram Stain   Final    WBC PRESENT,BOTH PMN AND MONONUCLEAR NO ORGANISMS SEEN Performed at [facility], [address] [place], [place] [address]    Report Status 07/15/2017 FINAL  Final     Patient was seen and examined on the day of discharge and was found to be in stable condition. Time coordinating discharge: 45 minutes including assessment and coordination of care, as well as examination of the patient.   SIGNED:  [name], DO [facility] Pager [phone]  If 7PM-7AM, please contact night-coverage [email or web address] Password [email or web address] 07/19/2017, 11:27 AM

## 2017-07-19 NOTE — Consult Note (Signed)
[place] Nurse wound consult note Reason for Consult: Family request to reconsult for right lower leg, per patient.  Spouse not present at time of assessment in room 1516. I unwrapped the dressing present to the right lower leg.  Products had been applied as previously ordered.  The areas remain basically unchanged.  The patient is reporting that he started having increased tingling sensations to the right inner malleolar area last night.  There is some erythema to the gaiter/foot area, however, this was present when I saw the patient on 5/6.  The darkened/purple area to the right lower lateral leg above the ankle is unchanged from when I saw it also on 5/6.  I do not see a need to change any of the wound care orders to the RLE at this time.  The patient did not want me to view the LLE at this time. Monitor the wound area(s) for worsening of condition such as: Signs/symptoms of infection,  Increase in size,  Development of or worsening of odor, Development of pain, or increased pain at the affected locations.  Notify the medical team if any of these develop.  Thank you for the consult.  Discussed plan of care with the patient and bedside nurse.  [place] nurse will not follow at this time.  Please re-consult the [place] team if needed.  Val Riles, RN, MSN, CWOCN, CNS-BC, pager [phone]

## 2017-07-19 NOTE — Clinical Social Work Placement (Signed)
Patient received and accepted bed offer at [facility]. Facility aware of patient's discharge and confirmed bed offer. PTAR contacted, patient's family notified. Patient's RN can call report to [phone] Room 3242, packet complete. CSW signing off, no other needs identified at this time.  CLINICAL SOCIAL WORK PLACEMENT  NOTE  Date:  07/19/2017  Patient Details  Name: [name] MRN: [id number] Date of Birth: 15-Apr-1933  Clinical Social Work is seeking post-discharge placement for this patient at the [place] level of care (*CSW will initial, date and re-position this form in  chart as items are completed):  Yes   Patient/family provided with [facility] Work Department's list of facilities offering this level of care within the geographic area requested by the patient (or if unable, by the patient's family).  Yes   Patient/family informed of their freedom to choose among providers that offer the needed level of care, that participate in Medicare, Medicaid or managed care program needed by the patient, have an available bed and are willing to accept the patient.  Yes   Patient/family informed of [facility]'s ownership interest in [facility] and [facility], as well as of the fact that they are under no obligation to receive care at these facilities.  PASRR submitted to EDS on       PASRR number received on       Existing PASRR number confirmed on 07/16/17     FL2 transmitted to all facilities in geographic area requested by pt/family on 07/16/17     FL2 transmitted to all facilities within larger geographic area on       Patient informed that his/her managed care company has contracts with or will negotiate with certain facilities, including the following:        Yes   Patient/family informed of bed offers received.  Patient chooses bed at [facility]     Physician recommends and patient chooses bed at        Patient to be transferred to [facility] on 07/19/17.  Patient to be transferred to facility by PTAR     Patient family notified on 07/19/17 of transfer.  Name of family member notified:  Reine Just     PHYSICIAN       Additional Comment:    _______________________________________________ Burnis Medin, LCSW 07/19/2017, 12:32 PM

## 2017-07-19 NOTE — Discharge Instructions (Signed)
Please weigh patient daily approximately around the same time, using same scale. Follow diuretic regimen as outlined below.  - weight < 160 lb, do not give diuretic, no KCl - weight 160-170 lb, furosemide 40 mg daily with KCl 62mEq - weight >170 lb, furosemide 80 mg daily with KCl 32mEq  Call [facility] office with questions or concerns regarding weight gain or diuretic dosing.

## 2017-07-19 NOTE — Progress Notes (Signed)
Called report to [place] at [name] and orders verified.

## 2017-07-19 NOTE — Progress Notes (Addendum)
Progress Note  Patient Name: [name] Date of Encounter: 07/19/2017  Primary Cardiologist:  Skeet Latch, MD  Subjective   Eating better, getting slowly stronger.   Inpatient Medications    Scheduled Meds: . apixaban  5 mg Oral BID  . calcium carbonate  1,250 mg Oral BID WC  . cholecalciferol  1,000 Units Oral BID  . collagenase   Topical Daily  . diltiazem  300 mg Oral Daily  . divalproex  250 mg Oral Daily  . divalproex  750 mg Oral QHS  . doxazosin  4 mg Oral Daily  . ezetimibe  5 mg Oral QHS  . feeding supplement  1 Container Oral TID BM  . feeding supplement (ENSURE ENLIVE)  237 mL Oral TID BM  . feeding supplement (PRO-STAT SUGAR FREE 64)  30 mL Oral BID  . ferrous sulfate  325 mg Oral BID WC  . fluticasone  1 spray Each Nare Daily  . furosemide  40 mg Oral Daily  . gabapentin  300 mg Oral BID  . ipratropium-albuterol  3 mL Nebulization BID  . levothyroxine  150 mcg Oral QAC breakfast  . lidocaine  1 patch Transdermal Q24H  . metoprolol tartrate  25 mg Oral BID  . pantoprazole  40 mg Oral Daily  . pravastatin  40 mg Oral QHS  . predniSONE  10 mg Oral Q breakfast  . vitamin B-12  500 mcg Oral QHS   Continuous Infusions:  PRN Meds: acetaminophen, HYDROcodone-acetaminophen, RESOURCE THICKENUP CLEAR   Vital Signs    Vitals:   07/18/17 2300 07/18/17 2305 07/19/17 0425 07/19/17 0624  BP:   130/76   Pulse:   67   Resp:   16   Temp:   97.8 F (36.6 C)   TempSrc:   Oral   SpO2: (!) 89% 97% 99%   Weight:    155 lb 13.8 oz (70.7 kg)    Intake/Output Summary (Last 24 hours) at 07/19/2017 0828 Last data filed at 07/19/2017 0426 Gross per 24 hour  Intake 780 ml  Output 1750 ml  Net -970 ml   Filed Weights   07/18/17 0438 07/18/17 1041 07/19/17 0624  Weight: 161 lb 2.5 oz (73.1 kg) 164 lb 6.4 oz (74.6 kg) 155 lb 13.8 oz (70.7 kg)    Telemetry    Not on - Personally Reviewed  ECG     04/22, Atrial fib, HR 62, no acute changes - Personally  Reviewed  Physical Exam   General: Well developed, well nourished, male appearing in no acute distress. Head: Normocephalic, atraumatic.  Neck: Supple without bruits, JVD not elevated. Lungs:  Resp regular and unlabored, CTA. Heart: Irreg R&R, S1, S2, no S3, S4, or murmur; no rub. Abdomen: Soft, non-tender, non-distended with normoactive bowel sounds. No hepatomegaly. No rebound/guarding. No obvious abdominal masses. Extremities: No clubbing, cyanosis, no edema. Distal pedal pulses are 2+ bilaterally. Neuro: Alert and oriented X 3. Moves all extremities spontaneously. Psych: Normal affect.  Labs    Hematology Recent Labs  Lab 07/17/17 [phone] 07/18/17 0546 07/19/17 0551  WBC 7.4 8.3 8.5  RBC 3.04* 3.11* 3.13*  HGB 9.1* 9.2* 9.4*  HCT 29.4* 30.0* 30.1*  MCV 96.7 96.5 96.2  MCH 29.9 29.6 30.0  MCHC 31.0 30.7 31.2  RDW 14.8 14.6 14.9  PLT 148* 167 177    Chemistry Recent Labs  Lab 07/17/17 0628 07/18/17 0546 07/19/17 0551  NA 142 140 142  K 3.7 3.7 3.6  CL 103 102  103  CO2 27 27 27   GLUCOSE 82 79 79  BUN 31* 38* 38*  CREATININE 1.37* 1.29* 1.28*  CALCIUM 8.4* 8.4* 8.4*  GFRNONAA 47* 50* 51*  GFRAA 54* 58* 59*  ANIONGAP 12 11 12       Radiology    Dg Chest 1 View  Result Date: 07/15/2017 CLINICAL DATA:  Status post right thoracentesis today. EXAM: CHEST  1 VIEW COMPARISON:  CT chest 07/14/2017. Single-view of the chest 07/12/2017. FINDINGS: No pneumothorax is identified after thoracentesis. Right pleural effusion is decreased. Bibasilar airspace disease and left pleural effusion are unchanged. IMPRESSION: Negative for pneumothorax after right thoracentesis. No change in bibasilar airspace disease in the left effusion. Electronically Signed   By: Inge Rise M.D.   On: 07/15/2017 12:32   US Thoracentesis Asp Pleural Space W/img Guide  Result Date: 07/15/2017 INDICATION: Patient with history of coronary artery disease, CHF, prior aspiration pneumonia, bilateral  pleural effusions, right greater than left. Request made for diagnostic and therapeutic right thoracentesis. EXAM: ULTRASOUND GUIDED DIAGNOSTIC AND THERAPEUTIC RIGHT THORACENTESIS MEDICATIONS: None COMPLICATIONS: None immediate. PROCEDURE: An ultrasound guided thoracentesis was thoroughly discussed with the patient and questions answered. The benefits, risks, alternatives and complications were also discussed. The patient understands and wishes to proceed with the procedure. Written consent was obtained. Ultrasound was performed to localize and mark an adequate pocket of fluid in the right chest. The area was then prepped and draped in the normal sterile fashion. 1% Lidocaine was used for local anesthesia. Under ultrasound guidance a 6 Fr Safe-T-Centesis catheter was introduced. Thoracentesis was performed. The catheter was removed and a dressing applied. FINDINGS: A total of approximately 1.1 liters of yellow fluid was removed. Samples were sent to the laboratory as requested by the clinical team. IMPRESSION: Successful ultrasound guided diagnostic and therapeutic right thoracentesis yielding 1.1 liters of pleural fluid. Follow-up chest x-ray revealed no pneumothorax. Read by: Rowe Robert, PA-C Electronically Signed   By: Lucrezia Europe M.D.   On: 07/15/2017 12:36     Cardiac Studies   ECHO: 04/18/2017 - Left ventricle: The cavity size was normal. Wall thickness was increased in a pattern of moderate LVH. Systolic function was normal. The estimated ejection fraction was in the range of 60% to 65%. Wall motion was normal; there were no regional wall motion abnormalities. - Mitral valve: There was mild regurgitation. - Left atrium: The atrium was moderately to severely dilated. - Right atrium: The atrium was moderately dilated. - Pulmonary arteries: Systolic pressure was mildly to moderately increased. PA peak pressure: 37 mm Hg (S).  Patient Profile     84 y.o. male w/ hx RCA PCI  2002,HTN, HLD, GERD, OA, vertigo,chronic right leg wound, seizures, memory loss, D-CHF & atrial fib dx 04/2017 on Eliquis w/ CHA2DS2-VASc = 5 (age x 2, CAD, HTN, CHF), dementia, hypothyroid, OSA not on CPAP, was admitted 05/02 for weakness, ?asp PNA, AMS, AKI w/ Cr 1.56. Cards saw 05/08 for CHF exacerbation/pleural effusions s/p 1.1 L removed by thoracentesis.  Assessment & Plan    1. Chronic diastolic CHF:  - standing wt pending - on Lasix 40 mg po qd - BUN up but Cr trending down - fluids 1-2 qts daily - will need to follow renal function closely. - has f/u appt 05/16 w/ Dr Oval Linsey - renal function stable, accurate wt pending  2. Persistent atrial fib - HR irregular, he is likely still in afib - rate ok on Cardizem CD 300 mg qd and metoprolol 25  mg bid - anticoag w/ Eliquis 5 mg bid  Otherwise, per IM Principal Problem:   Acute hypoxemic respiratory failure (HCC) Active Problems:   Essential hypertension   Seizure disorder (HCC)   Acute kidney injury ([place])   CKD (chronic kidney disease) stage 3, GFR 30-59 ml/min (HCC)   Acute metabolic encephalopathy   Acute encephalopathy   Acute on chronic diastolic heart failure (HCC)   PAF (paroxysmal atrial fibrillation) (HCC)   Aspiration syndrome, subsequent encounter   Jonetta Speak , PA-C 8:28 AM 07/19/2017 Pager: [phone]  I have seen and examined the patient along with Rosaria Ferries , PA-C.  I have reviewed the chart, notes and new data.  I agree with PA/NP's note.  Key new complaints: no dyspnea and no dizziness. Key examination changes: BP normal. Weight (rechecked) is 155 lb, 6 lb less than yesterday. In/out -ve 1L. Key new findings / data: BUN inched up to 38, creatinine unchanged  PLAN: Notwithstanding today's weight, I think we should stick to the previous recommended diuretic plan, which offers the advantage of easy to remember guidelines and a fairly broad range of weight so that he will hopefully not  require frequent adjustments. Need to recalibrate based on weight on scale at SNF.  - weight < 160 lb, do not give diuretic - weight 160-170 lb, furosemide 40 mg daily - weight >170 lb, furosemide 80 mg daily   (with appropriate adjustments in KCl supplements -no potassium if not taking diuretic, double potassium if doubling diuretic)   Sanda Klein, MD, [place] [phone] 07/19/2017, 11:21 AM     Sanda Klein, MD, [place] [phone] 07/19/2017, 8:28 AM

## 2017-07-19 NOTE — Progress Notes (Signed)
Physical Therapy Treatment Patient Details Name: [name] MRN: [id number] DOB: Sep 04, 1933 Today's Date: 07/19/2017    History of Present Illness R pleural Effusion     PT Comments    Assisted from recliner to [facility] required + 2 assist.  General transfer comment: side by side MAX assist to stand and complete 1/4 pivot to [facility] with 50% vc'S on hand transfer/placement and turn completion.  General Gait Details: R toe drag and used EVA walker for increased support, increase amb distance and increase WBing time standing upright. Pt plans to D/C to SNF   Follow Up Recommendations  SNF     Equipment Recommendations       Recommendations for Other Services       Precautions / Restrictions Precautions Precautions: Fall Precaution Comments: R LE weakness with foot drop (nerve damage from old back surgery stated spouse) Restrictions Weight Bearing Restrictions: No    Mobility  Bed Mobility               General bed mobility comments: OOB in reclioner    Transfers Overall transfer level: Needs assistance Equipment used: Bilateral platform walker(EVA walker) Transfers: Sit to/from Stand Sit to Stand: Max assist;+2 safety/equipment;Mod assist;+2 physical assistance Stand pivot transfers: Max assist;+2 safety/equipment;+2 physical assistance       General transfer comment: side by side MAX assist to stand and complete 1/4 pivot to [facility] with 50% vc'S on hand transfer/placement and turn completion.    Ambulation/Gait Ambulation/Gait assistance: Mod assist;+2 physical assistance;+2 safety/equipment Ambulation Distance (Feet): 42 Feet Assistive device: Bilateral platform walker Gait Pattern/deviations: Step-to pattern;Decreased stance time - right;Decreased weight shift to right Gait velocity: decreased   General Gait Details: R toe drag and used EVA walker for increased support, increase amb distance and increase WBing time standing upright.    Stairs              Wheelchair Mobility    Modified Rankin (Stroke Patients Only)       Balance                                            Cognition Arousal/Alertness: Awake/alert Behavior During Therapy: WFL for tasks assessed/performed Overall Cognitive Status: Within Functional Limits for tasks assessed                                 General Comments: pleasant      Exercises      General Comments        Pertinent Vitals/Pain Pain Assessment: Faces Faces Pain Scale: Hurts little more Pain Location: R LE "burning" Pain Descriptors / Indicators: Aching;Burning Pain Intervention(s): Monitored during session;Repositioned    Home Living                      Prior Function            PT Goals (current goals can now be found in the care plan section) Progress towards PT goals: Progressing toward goals    Frequency    Min 3X/week      PT Plan Current plan remains appropriate    Co-evaluation              AM-PAC PT "6 Clicks" Daily Activity  Outcome Measure  Difficulty turning over in bed (including adjusting  bedclothes, sheets and blankets)?: A Lot Difficulty moving from lying on back to sitting on the side of the bed? : A Lot Difficulty sitting down on and standing up from a chair with arms (e.g., wheelchair, bedside commode, etc,.)?: A Lot Help needed moving to and from a bed to chair (including a wheelchair)?: A Lot Help needed walking in hospital room?: A Lot Help needed climbing 3-5 steps with a railing? : Total 6 Click Score: 11    End of Session Equipment Utilized During Treatment: Gait belt Activity Tolerance: Patient tolerated treatment well Patient left: in chair;with chair alarm set;with call bell/phone within reach Nurse Communication: (communicated to NT pt used BSC (BM) ) PT Visit Diagnosis: Muscle weakness (generalized) (M62.81);Other abnormalities of gait and mobility (R26.89);Unsteadiness on feet  (R26.81)     Time: 6195-0932 PT Time Calculation (min) (ACUTE ONLY): 26 min  Charges:  $Gait Training: 8-22 mins $Therapeutic Activity: 8-22 mins                    G Codes:       Rica Koyanagi  PTA [facility]  Acute  Rehab Pager      [phone]

## 2017-07-20 LAB — CULTURE, BODY FLUID-BOTTLE: Culture: NO GROWTH

## 2017-07-20 LAB — CULTURE, BODY FLUID W GRAM STAIN -BOTTLE

## 2017-07-23 ENCOUNTER — Encounter (HOSPITAL_BASED_OUTPATIENT_CLINIC_OR_DEPARTMENT_OTHER): Payer: Medicare Other | Attending: Internal Medicine

## 2017-07-23 DIAGNOSIS — I2510 Atherosclerotic heart disease of native coronary artery without angina pectoris: Secondary | ICD-10-CM | POA: Diagnosis not present

## 2017-07-23 DIAGNOSIS — L97822 Non-pressure chronic ulcer of other part of left lower leg with fat layer exposed: Secondary | ICD-10-CM | POA: Insufficient documentation

## 2017-07-23 DIAGNOSIS — G4730 Sleep apnea, unspecified: Secondary | ICD-10-CM | POA: Insufficient documentation

## 2017-07-23 DIAGNOSIS — I5033 Acute on chronic diastolic (congestive) heart failure: Secondary | ICD-10-CM | POA: Diagnosis not present

## 2017-07-23 DIAGNOSIS — L97312 Non-pressure chronic ulcer of right ankle with fat layer exposed: Secondary | ICD-10-CM | POA: Insufficient documentation

## 2017-07-23 DIAGNOSIS — I890 Lymphedema, not elsewhere classified: Secondary | ICD-10-CM | POA: Insufficient documentation

## 2017-07-23 DIAGNOSIS — L97812 Non-pressure chronic ulcer of other part of right lower leg with fat layer exposed: Secondary | ICD-10-CM | POA: Diagnosis not present

## 2017-07-23 DIAGNOSIS — I739 Peripheral vascular disease, unspecified: Secondary | ICD-10-CM | POA: Insufficient documentation

## 2017-07-23 DIAGNOSIS — I110 Hypertensive heart disease with heart failure: Secondary | ICD-10-CM | POA: Insufficient documentation

## 2017-07-24 ENCOUNTER — Encounter: Payer: Self-pay | Admitting: Physician Assistant

## 2017-07-24 ENCOUNTER — Telehealth: Payer: Self-pay | Admitting: Cardiovascular Disease

## 2017-07-24 NOTE — Telephone Encounter (Signed)
Neurosurgery and spine  Would also like most recent office note faxed along with clearance.

## 2017-07-24 NOTE — Telephone Encounter (Signed)
New Message:         [facility] Pre-operative Risk Assessment    Request for surgical clearance:  1. What type of surgery is being performed? Caudal block  2. When is this surgery scheduled? 08/02/17   3. What type of clearance is required (medical clearance vs. Pharmacy clearance to hold med vs. Both)? Pharmacy  4. Are there any medications that need to be held prior to surgery and how long?Eliquis 3 days prior and any other blood thinners  5. Practice name and name of physician performing surgery? Dr. Clydell Hakim   6. What is your office phone number   [phone] 7.   What is your office fax number [phone]  8.   Anesthesia type (None, local, MAC, general) ? Local   Janan Ridge 07/24/2017, 10:55 AM  _________________________________________________________________   (provider comments below)

## 2017-07-24 NOTE — Telephone Encounter (Signed)
Pt takes Eliquis for afib with CHADS2VASc score of 5 (age x2, HTN, CHF, CAD). CrCl is 45mL/min. Ok to hold Eliquis for 3 days prior to procedure.

## 2017-07-25 ENCOUNTER — Other Ambulatory Visit: Payer: Self-pay | Admitting: Cardiovascular Disease

## 2017-07-25 ENCOUNTER — Encounter: Payer: Self-pay | Admitting: Physician Assistant

## 2017-07-25 ENCOUNTER — Telehealth: Payer: Self-pay

## 2017-07-25 ENCOUNTER — Ambulatory Visit (INDEPENDENT_AMBULATORY_CARE_PROVIDER_SITE_OTHER): Payer: Medicare Other | Admitting: Physician Assistant

## 2017-07-25 VITALS — BP 118/62 | HR 70

## 2017-07-25 DIAGNOSIS — N183 Chronic kidney disease, stage 3 unspecified: Secondary | ICD-10-CM

## 2017-07-25 DIAGNOSIS — I4819 Other persistent atrial fibrillation: Secondary | ICD-10-CM

## 2017-07-25 DIAGNOSIS — I481 Persistent atrial fibrillation: Secondary | ICD-10-CM | POA: Diagnosis not present

## 2017-07-25 DIAGNOSIS — I5032 Chronic diastolic (congestive) heart failure: Secondary | ICD-10-CM | POA: Diagnosis not present

## 2017-07-25 DIAGNOSIS — L03114 Cellulitis of left upper limb: Secondary | ICD-10-CM

## 2017-07-25 DIAGNOSIS — Z7901 Long term (current) use of anticoagulants: Secondary | ICD-10-CM

## 2017-07-25 MED ORDER — CEPHALEXIN 500 MG PO CAPS: 500.0000 mg | ORAL_CAPSULE | Freq: Two times a day (BID) | ORAL | 0 refills | Status: AC

## 2017-07-25 NOTE — Telephone Encounter (Signed)
-----   Message from Sanda Klein, MD sent at 07/25/2017  3:13 PM EDT ----- Regarding: RE: SWITCHING CARE No objection ----- Message ----- From: Harold Hedge, CMA Sent: 07/25/2017   3:05 PM To: Sanda Klein, MD, Dionne Bucy Truitt, CMA Subject: SWITCHING CARE                                 Hello!  This patient would like to switch from Dr Oval Linsey to Dr C is this ok?  Thanks,  [employer]

## 2017-07-25 NOTE — Telephone Encounter (Signed)
   Primary Cardiologist: Skeet Latch, MD  Chart reviewed as part of pre-operative protocol coverage. Given past medical history and time since last visit, based on ACC/AHA guidelines, [name] would be at acceptable risk for the planned procedure without further cardiovascular testing.   Per pharmacy, it is ok to hold Eliquis 3 days prior to procedure.   I will route this recommendation to the requesting party via Epic fax function and remove from pre-op pool.  Please call with questions.  Lyda Jester, PA-C 07/25/2017, 1:54 PM

## 2017-07-25 NOTE — Progress Notes (Signed)
Cardiology Office Note   Date:  07/25/2017   ID:  ANTERO DEROSIA, DOB 1933/12/15, MRN [id number]  PCP:  [name], MD  Cardiologist:  Dr Oval Linsey 04/25/2017 in-hospital Rosaria Ferries, PA-C 07/19/2017 in-hospital  Chief Complaint  Patient presents with  . Hospitalization Follow-up    History of Present Illness: [name] is a 84 y.o. male with a history of RCA PCI 2002,HTN, HLD, GERD, OA, vertigo,chronic right leg wound, seizures, memory loss, D-CHF & atrial fib dx 04/2017 on Eliquis w/ CHA2DS2-VASc = 5 (age x 2, CAD, HTN, CHF), dementia, hypothyroid, OSA not on CPAP, DNR, dysphagia 3 diet w/ nectar thick liquids  Problems w/ asp PNA and inconsistent po intake led to AKI (?2nd dehydration)>> IVF>>volume overload >>po Lasix>>hypernatremia (NA 149)/dehydration>> 1/2 NS w/ IV Lasix>>oral Lasix Admitted 05/02-05/10 with possible asp, Cr 1.56>>Lasix held>>pleural effusions>>thoracentesis w/ 1.1 L transudate removed, deconditioning>>SNF. Needs daily weights and Lasix dosed based on them. Needs to make sure he drinks 1-2 qts all liquids daily Lasix dosing at d/c:  - weight < 160 lb, do not give diuretic - weight 160-170 lb, furosemide 40 mg daily - weight >170 lb, furosemide 80 mg daily   [name] presents for cardiology follow up.  His wife is present.  The med list from the facility was reviewed.  He is supposed to get extra Lasix if his weight is greater than 170 pounds.  He is otherwise to get Lasix 40 mg daily.  There is no order in there to hold it if his weight is less than 160 pounds.  He is to get potassium 20 mEq with Lasix 40 mg daily.  However, his wife is not sure that he has been getting the potassium daily.  [name] is participating in therapies.  He feels more alert.  He feels he is making some progress in strength.  He has not had presyncope, no orthostatic symptoms.  His lower extremity edema has improved and he does not feel like he is waking  with it.  He does get some during the day.  He denies orthopnea or PND.  His right forearm has a knot and there is an area of developing redness about this.  It looks like this is in the area of a recent IV site.  He has a wound on his right lower extremity that has been wrapped.  He has an appointment with the wound center for this.  He has not had any chest pain.  Weights from [facility] are reviewed, his weight has been between 161 and 165 pounds.   Past Medical History:  Diagnosis Date  . Abnormality of gait 09/22/2015  . Arthritis   . Atrial fibrillation ([place])   . CAD (coronary artery disease)    Stent to RCA, Penta stent, 99% reduced to 0% 2002.  . Cancer ([place])    skin CA removed from back  . Chronic insomnia 03/30/2015  . Complication of anesthesia    trouble waking up  . Constipation    from medications taken  . GERD (gastroesophageal reflux disease)   . Hypercholesteremia   . Hypertension   . Hypothyroidism   . Memory difficulty 09/22/2015  . Osteoarthritis   . Seizures ([place])   . Sepsis ([place]) 05/2017  . Transient alteration of awareness 03/30/2015  . Vertigo    hx of    Past Surgical History:  Procedure Laterality Date  . BACK SURGERY    . EYE SURGERY  Bilateral Cataract surgery   . HERNIA REPAIR    . I&D EXTREMITY Right 05/10/2015   Procedure: IRRIGATION AND DEBRIDEMENT EXTREMITY;  Surgeon: Leanora Cover, MD;  Location: [facility];  Service: Orthopedics;  Laterality: Right;  . KNEE ARTHROPLASTY     right knee X 2; left knee once  . LAMINECTOMY     X 6  . POSTERIOR CERVICAL FUSION/FORAMINOTOMY  01/28/2012   Procedure: POSTERIOR CERVICAL FUSION/FORAMINOTOMY LEVEL 3;  Surgeon: [name], MD;  Location: [facility];  Service: Neurosurgery;  Laterality: Left;  Posterior Cervical Five-Thoracic One Fusion, Arthrodesis with LEFT Cervical Seven-thoracic One Laminectomy, Foraminotomy and Resection of Synovial Cyst  . POSTERIOR CERVICAL  FUSION/FORAMINOTOMY N/A 01/29/2013   Procedure: POSTERIOR CERVICAL FUSION/FORAMINOTOMY LEVEL 1 and C2-5 Posteriolateral Arthrodesis;  Surgeon: [name], MD;  Location: [facility];  Service: Neurosurgery;  Laterality: N/A;  C2-C3 Laminectomy C2-C3 posterior cervical arthrodesis  . TONSILLECTOMY      Current Outpatient Medications  Medication Sig Dispense Refill  . acetaminophen (TYLENOL) 325 MG tablet Take 650 mg by mouth every 4 (four) hours as needed for mild pain.    Marland Kitchen alendronate (FOSAMAX) 70 MG tablet Take 70 mg by mouth once a week.     . Amino Acids-Protein Hydrolys (FEEDING SUPPLEMENT, PRO-STAT SUGAR FREE 64,) LIQD Take 30 mLs by mouth 2 (two) times daily. 900 mL 0  . apixaban (ELIQUIS) 5 MG TABS tablet Take 1 tablet (5 mg total) by mouth 2 (two) times daily. 60 tablet 0  . calcium carbonate (OS-CAL - DOSED IN MG OF ELEMENTAL CALCIUM) 1250 (500 Ca) MG tablet Take 1 tablet (1,250 mg total) by mouth 2 (two) times daily with a meal. 60 tablet 0  . Cholecalciferol (VITAMIN D-3) 1000 units CAPS Take 1,000 Units by mouth 2 (two) times daily.    . Coenzyme Q10 (COQ10) 200 MG CAPS Take 200 mg by mouth at bedtime.    . collagenase (SANTYL) ointment Apply topically daily. 15 g 0  . cyanocobalamin 500 MCG tablet Take 500 mcg by mouth at bedtime.    Marland Kitchen diltiazem (CARDIZEM CD) 300 MG 24 hr capsule Take 1 capsule (300 mg total) by mouth daily. 30 capsule 0  . divalproex (DEPAKOTE) 250 MG DR tablet Take 3 tablets (750 mg total) by mouth every 12 (twelve) hours. (Patient taking differently: Take 250-750 mg by mouth 2 (two) times daily. Take 1 tablet (250 mg) in the morning and Take 3 tablets (750 mg) in the evening.) 60 tablet 0  . docusate sodium (COLACE) 100 MG capsule Take 200 mg by mouth 2 (two) times daily.     Marland Kitchen doxazosin (CARDURA) 4 MG tablet Take 1 tablet (4 mg total) by mouth daily. 30 tablet 0  . feeding supplement, ENSURE ENLIVE, (ENSURE ENLIVE) LIQD Take 237 mLs by mouth 3 (three)  times daily between meals. 90 Bottle 0  . ferrous sulfate 325 (65 FE) MG tablet Take 1 tablet (325 mg total) by mouth 2 (two) times daily with a meal. 60 tablet 0  . furosemide (LASIX) 40 MG tablet Weight daily. If weight < 160 lb, do not give diuretic. If weight 160-170 lb, furosemide 40 mg daily. If weight >170 lb, furosemide 80 mg daily. 30 tablet 0  . gabapentin (NEURONTIN) 300 MG capsule Take 300 mg by mouth 2 (two) times daily.    Marland Kitchen HYDROcodone-acetaminophen (NORCO) 10-325 MG tablet Take 1 tablet by mouth every 6 (six) hours as needed for moderate pain.  8 tablet 0  . levothyroxine (SYNTHROID, LEVOTHROID) 150 MCG tablet Take 150 mcg by mouth daily before breakfast.    . lidocaine (LIDODERM) 5 % Place 1 patch onto the skin daily as needed. Remove & Discard patch within 12 hours or as directed by MD 30 patch 0  . metoprolol tartrate (LOPRESSOR) 25 MG tablet Take 1 tablet (25 mg total) by mouth 2 (two) times daily. 60 tablet 0  . Multiple Vitamin (MULTIVITAMIN WITH MINERALS) TABS tablet Take 1 tablet by mouth at bedtime.    . nitroGLYCERIN (NITROSTAT) 0.4 MG SL tablet Place 0.4 mg under the tongue every 5 (five) minutes as needed for chest pain. Reported on 07/20/2015    . omeprazole (PRILOSEC) 20 MG capsule Take 20 mg by mouth 2 (two) times daily before a meal.     . Potassium Chloride ER 20 MEQ TBCR Take only when taking lasix. Take 65mEq when taking Lasix 40mg . Take 53mEq when taking Lasix 80mg . Lasix dosing will be based on your daily weight. 30 tablet 0  . pravastatin (PRAVACHOL) 40 MG tablet Take 40 mg by mouth at bedtime.   2  . predniSONE (DELTASONE) 10 MG tablet Take 1 tablet (10 mg total) by mouth daily with breakfast.    . senna (SENOKOT) 8.6 MG tablet Take 1 tablet by mouth 2 (two) times daily.     . vitamin A 8000 UNIT capsule Take 8,000 Units by mouth 2 (two) times daily.    . vitamin C (ASCORBIC ACID) 500 MG tablet Take 500 mg by mouth every morning.    Marland Kitchen ZETIA 10 MG tablet Take 5 mg  by mouth at bedtime.     Marland Kitchen zinc sulfate 220 (50 Zn) MG capsule Take 1 capsule (220 mg total) by mouth every morning. 30 capsule 0   No current facility-administered medications for this visit.     Allergies:   Demerol [meperidine] and Keppra [levetiracetam]    Social History:  The patient  reports that he has quit smoking. He has never used smokeless tobacco. He reports that he drinks alcohol. He reports that he does not use drugs.   Family History:  The patient's family history includes Cancer in his mother; Heart disease in his father; Hypertension in his mother; Kidney failure in his father.    ROS:  Please see the history of present illness. All other systems are reviewed and negative.    PHYSICAL EXAM: VS:  BP 118/62 (BP Location: Left Arm)   Pulse 70  , BMI There is no height or weight on file to calculate BMI. GEN: Well nourished, well developed, male in no acute distress  HEENT: normal for age  Neck: Minimal JVD, no carotid bruit, no masses Cardiac: Irregular rate and rhythm; no murmur, no rubs, or gallops Respiratory: Decreased breath sounds bases bilaterally, L>R, normal work of breathing GI: soft, nontender, nondistended, + BS MS: no deformity or atrophy; 1+ bilateral lower extremity edema; distal pulses are 2+ in upper extremities; right lower extremity is wrapped and not disturbed Skin: warm and dry, no rash; an area of erythema is noted on his left forearm.  There is a knot in this area as well and this may be at a previous IV site.  He has other areas of ecchymosis that are slowly resolving. Neuro:  Strength and sensation are intact Psych: euthymic mood, full affect   EKG:  EKG is not ordered today.  ECHO:04/18/2017 - Left ventricle: The cavity size was normal. Wall thickness  was increased in a pattern of moderate LVH. Systolic function was normal. The estimated ejection fraction was in the range of 60% to 65%. Wall motion was normal; there were no  regional wall motion abnormalities. - Mitral valve: There was mild regurgitation. - Left atrium: The atrium was moderately to severely dilated. - Right atrium: The atrium was moderately dilated. - Pulmonary arteries: Systolic pressure was mildly to moderately increased. PA peak pressure: 37 mm Hg (S).   Recent Labs: 07/11/2017: ALT 14; B Natriuretic Peptide 309.9; Magnesium 1.7 07/12/2017: TSH 0.452 07/19/2017: BUN 38; Creatinine, Ser 1.28; Hemoglobin 9.4; Platelets 177; Potassium 3.6; Sodium 142    Lipid Panel    Component Value Date/Time   CHOL 82 07/12/2017 0624   TRIG 45 07/12/2017 0624   HDL 46 07/12/2017 0624   CHOLHDL 1.8 07/12/2017 0624   VLDL 9 07/12/2017 0624   LDLCALC 27 07/12/2017 0624     Wt Readings from Last 3 Encounters:  07/19/17 155 lb 9.6 oz (70.6 kg)  07/05/17 183 lb 13.8 oz (83.4 kg)  06/21/17 189 lb (85.7 kg)     Other studies Reviewed: Additional studies/ records that were reviewed today include: Office notes, hospital records and testing.  ASSESSMENT AND PLAN:  1.  Persistent atrial fibrillation: Rate is controlled on current doses of Cardizem and metoprolol, no changes.  2.  Chronic anticoagulation: No bleeding issues on the Eliquis.  He has a procedure coming up and this has been reviewed by the pharmacist, okay to hold Eliquis for 3 days prior to the procedure  3.  Preop evaluation: He is to get a caudal block on 08/02/2017.  He is at acceptable risk for the planned procedure with no further cardiac testing indicated at this time.  4.  Chronic diastolic CHF: He has not been able to stand to get weights.  The wheelchair weights that they are getting at the facility are within a 5 pound range.  This is acceptable.  I will add a parameter to hold the Lasix for weight less than 160 pounds.  I will also clarify that he is to get K-Dur 20 milliequivalents with each dose of Lasix 40 mg.  Check a BMET today.  5.  Possible left upper extremity cellulitis:  I will go ahead and give him Keflex 500 mg twice daily for 10 days.  6. CKD III: Check a BMET today.   Current medicines are reviewed at length with the patient today.  The patient has concerns regarding medicines.  Concerns were addressed  The following changes have been made: Clarify parameters for holding Lasix, add Keflex  Labs/ tests ordered today include:   Orders Placed This Encounter  Procedures  . Basic Metabolic Panel (BMET)     Disposition:   FU with Dr Oval Linsey  Signed, Rosaria Ferries, PA-C  07/25/2017 5:12 PM    [place] Phone: [phone]; Fax: [phone]  This note was written with the assistance of speech recognition software. Please excuse any transcriptional errors.

## 2017-07-25 NOTE — Patient Instructions (Addendum)
Medication Instructions:  Your physician recommends that you continue on your current medications as directed. Please refer to the Current Medication list given to you today.  Labwork: Your physician recommends that you return for lab work in: TODAY-BMET  Testing/Procedures: None   Follow-Up: Your physician recommends that you schedule a follow-up appointment in: Dr Sallyanne Kuster first available(Switching from Dr Gypsy Balsam patient request)  Any Other Special Instructions Will Be Listed Below (If Applicable).  If you need a refill on your cardiac medications before your next appointment, please call your pharmacy.

## 2017-07-25 NOTE — Telephone Encounter (Signed)
Patient seen [name] in the office today and requested to switch his care to Dr Sallyanne Kuster from Dr Oval Linsey. Both providers are ok with the change. I will inform the schedulers so they can contact patients wife and set up an appointment.

## 2017-07-26 ENCOUNTER — Telehealth: Payer: Self-pay | Admitting: Cardiovascular Disease

## 2017-07-26 ENCOUNTER — Other Ambulatory Visit: Payer: Self-pay | Admitting: *Deleted

## 2017-07-26 LAB — BASIC METABOLIC PANEL
BUN/Creatinine Ratio: 32 — ABNORMAL HIGH (ref 10–24)
BUN: 45 mg/dL — ABNORMAL HIGH (ref 8–27)
CO2: 27 mmol/L (ref 20–29)
Calcium: 8.5 mg/dL — ABNORMAL LOW (ref 8.6–10.2)
Chloride: 104 mmol/L (ref 96–106)
Creatinine, Ser: 1.41 mg/dL — ABNORMAL HIGH (ref 0.76–1.27)
GFR calc Af Amer: 54 mL/min/{1.73_m2} — ABNORMAL LOW (ref >59)
GFR calc non Af Amer: 46 mL/min/{1.73_m2} — ABNORMAL LOW (ref >59)
Glucose: 128 mg/dL — ABNORMAL HIGH (ref 65–99)
Potassium: 4.6 mmol/L (ref 3.5–5.2)
Sodium: 145 mmol/L — ABNORMAL HIGH (ref 134–144)

## 2017-07-26 NOTE — Patient Outreach (Signed)
[facility]) Care Management  07/26/2017  [name] [birth year] [id number]   CSW had received referral from [place], [name] that patient was discharging from [facility] to [facility] on 07/19/17. CSW met with patient at [facility] room #: 3242 to introduce self and program. Patient had previously signed consent with [facility], Natividad Brood on 05/30/17. Patient reports that he had just returned from therapy where they had him standing at the parallel bars and doing exercises to strengthen his arms. Patient is looking forward to returning home with his wife upon completion of rehab. Patient's wife, [name] was not present during [place] visit as she was at the license plate office getting the handicap plate renewed. CSW left patient with CSW business card and encouraged him to have [name] call should she have any questions or concerns. CSW will follow-up in 2 weeks.    Raynaldo Opitz, LCSW [facility]  Clinical Social Worker cell #: [phone]

## 2017-07-26 NOTE — Telephone Encounter (Signed)
error 

## 2017-07-29 ENCOUNTER — Encounter (HOSPITAL_COMMUNITY): Payer: Self-pay

## 2017-07-29 ENCOUNTER — Other Ambulatory Visit: Payer: Self-pay

## 2017-07-29 ENCOUNTER — Emergency Department (HOSPITAL_COMMUNITY)
Admission: EM | Admit: 2017-07-29 | Discharge: 2017-07-29 | Disposition: A | Discharge status: Home / Self Care | Payer: Medicare Other | Attending: Emergency Medicine | Admitting: Emergency Medicine

## 2017-07-29 DIAGNOSIS — Z87891 Personal history of nicotine dependence: Secondary | ICD-10-CM | POA: Diagnosis not present

## 2017-07-29 DIAGNOSIS — I2510 Atherosclerotic heart disease of native coronary artery without angina pectoris: Secondary | ICD-10-CM | POA: Insufficient documentation

## 2017-07-29 DIAGNOSIS — N183 Chronic kidney disease, stage 3 (moderate): Secondary | ICD-10-CM | POA: Insufficient documentation

## 2017-07-29 DIAGNOSIS — Z85828 Personal history of other malignant neoplasm of skin: Secondary | ICD-10-CM | POA: Diagnosis not present

## 2017-07-29 DIAGNOSIS — L03114 Cellulitis of left upper limb: Secondary | ICD-10-CM | POA: Diagnosis not present

## 2017-07-29 DIAGNOSIS — Z79899 Other long term (current) drug therapy: Secondary | ICD-10-CM | POA: Diagnosis not present

## 2017-07-29 DIAGNOSIS — E039 Hypothyroidism, unspecified: Secondary | ICD-10-CM | POA: Diagnosis not present

## 2017-07-29 DIAGNOSIS — I5033 Acute on chronic diastolic (congestive) heart failure: Secondary | ICD-10-CM | POA: Insufficient documentation

## 2017-07-29 DIAGNOSIS — I130 Hypertensive heart and chronic kidney disease with heart failure and stage 1 through stage 4 chronic kidney disease, or unspecified chronic kidney disease: Secondary | ICD-10-CM | POA: Insufficient documentation

## 2017-07-29 DIAGNOSIS — R21 Rash and other nonspecific skin eruption: Secondary | ICD-10-CM | POA: Diagnosis present

## 2017-07-29 LAB — CBC WITH DIFFERENTIAL/PLATELET
Basophils Absolute: 0 10*3/uL (ref 0.0–0.1)
Basophils Relative: 0 %
Eosinophils Absolute: 0 10*3/uL (ref 0.0–0.7)
Eosinophils Relative: 0 %
HCT: 31.9 % — ABNORMAL LOW (ref 39.0–52.0)
Hemoglobin: 9.9 g/dL — ABNORMAL LOW (ref 13.0–17.0)
Lymphocytes Relative: 10 %
Lymphs Abs: 0.8 10*3/uL (ref 0.7–4.0)
MCH: 30.1 pg (ref 26.0–34.0)
MCHC: 31 g/dL (ref 30.0–36.0)
MCV: 97 fL (ref 78.0–100.0)
Monocytes Absolute: 0.4 10*3/uL (ref 0.1–1.0)
Monocytes Relative: 5 %
Neutro Abs: 6.7 10*3/uL (ref 1.7–7.7)
Neutrophils Relative %: 85 %
Platelets: 160 10*3/uL (ref 150–400)
RBC: 3.29 MIL/uL — ABNORMAL LOW (ref 4.22–5.81)
RDW: 15.6 % — ABNORMAL HIGH (ref 11.5–15.5)
WBC: 8 10*3/uL (ref 4.0–10.5)

## 2017-07-29 LAB — COMPREHENSIVE METABOLIC PANEL
ALT: 28 U/L (ref 17–63)
AST: 20 U/L (ref 15–41)
Albumin: 3.4 g/dL — ABNORMAL LOW (ref 3.5–5.0)
Alkaline Phosphatase: 51 U/L (ref 38–126)
Anion gap: 10 (ref 5–15)
BUN: 41 mg/dL — ABNORMAL HIGH (ref 6–20)
CO2: 28 mmol/L (ref 22–32)
Calcium: 8.7 mg/dL — ABNORMAL LOW (ref 8.9–10.3)
Chloride: 106 mmol/L (ref 101–111)
Creatinine, Ser: 1.31 mg/dL — ABNORMAL HIGH (ref 0.61–1.24)
GFR calc Af Amer: 57 mL/min — ABNORMAL LOW (ref >60)
GFR calc non Af Amer: 49 mL/min — ABNORMAL LOW (ref >60)
Glucose, Bld: 113 mg/dL — ABNORMAL HIGH (ref 65–99)
Potassium: 4.8 mmol/L (ref 3.5–5.1)
Sodium: 144 mmol/L (ref 135–145)
Total Bilirubin: 0.6 mg/dL (ref 0.3–1.2)
Total Protein: 5.8 g/dL — ABNORMAL LOW (ref 6.5–8.1)

## 2017-07-29 LAB — I-STAT CG4 LACTIC ACID, ED: Lactic Acid, Venous: 1.15 mmol/L (ref 0.5–1.9)

## 2017-07-29 MED ORDER — CLINDAMYCIN HCL 300 MG PO CAPS
300.0000 mg | ORAL_CAPSULE | Freq: Once | ORAL | Status: AC
  Administered 2017-07-29: 300 mg via ORAL
  Filled 2017-07-29: qty 1

## 2017-07-29 MED ORDER — CLINDAMYCIN HCL 150 MG PO CAPS: 300.0000 mg | ORAL_CAPSULE | Freq: Four times a day (QID) | ORAL | 0 refills | Status: AC

## 2017-07-29 NOTE — ED Notes (Signed)
Attempted to call [facility]. Unable to give report due to staff unreachable.

## 2017-07-29 NOTE — ED Triage Notes (Signed)
Per EMS- Patient is from [facility] nd is there for rehab only. Patient reports that he has a rash and redness to the left forearm and stated that he has been receiving Lasix injections and since receiving Lasix has developed redness, burning, and rash to the left forearm.

## 2017-07-29 NOTE — ED Notes (Signed)
Blood in lab is past the allotted time to be able to run Istat, announced over radio to redraw.

## 2017-07-29 NOTE — ED Notes (Signed)
Notified [facility] for transportation back to [facility].

## 2017-07-29 NOTE — ED Provider Notes (Signed)
[facility] DEPT Provider Note  CSN: [id number] Arrival date & time: 07/29/17 1205  Chief Complaint(s) Rash  HPI [name] is a 84 y.o. male   The history is provided by the patient.  Rash   This is a new problem. Episode onset: 10 days. The problem has been gradually worsening. Associated with: possible IV during admission. There has been no fever. The rash is present on the left arm. The pain is moderate. The pain has been constant since onset. Associated symptoms include pain. Treatments tried: keflex (day 4) The treatment provided no relief.   Admitted recently for CHF exacerbation requiring IV diuresing.  Past Medical History Past Medical History:  Diagnosis Date  . Abnormality of gait 09/22/2015  . Arthritis   . Atrial fibrillation ([place])   . CAD (coronary artery disease)    Stent to RCA, Penta stent, 99% reduced to 0% 2002.  . Cancer ([place])    skin CA removed from back  . Chronic insomnia 03/30/2015  . Complication of anesthesia    trouble waking up  . Constipation    from medications taken  . GERD (gastroesophageal reflux disease)   . Hypercholesteremia   . Hypertension   . Hypothyroidism   . Memory difficulty 09/22/2015  . Osteoarthritis   . Seizures ([place])   . Sepsis ([place]) 05/2017  . Transient alteration of awareness 03/30/2015  . Vertigo    hx of   Patient Active Problem List   Diagnosis Date Noted  . Acute hypoxemic respiratory failure ([place]) 07/17/2017  . Aspiration syndrome, subsequent encounter 07/11/2017  . Aspiration pneumonia ([place]) 07/01/2017  . Acute encephalopathy 07/01/2017  . Thrombocytopenia ([place]) 07/01/2017  . Acute on chronic diastolic heart failure ([place]) 07/01/2017  . Anemia of chronic disease 07/01/2017  . Troponin level elevated 07/01/2017  . PAF (paroxysmal atrial fibrillation) ([place]) 07/01/2017  . Goals of care, counseling/discussion   . Palliative care by specialist   . Acute metabolic encephalopathy  32/44/0102  . CKD (chronic kidney disease) stage 3, GFR 30-59 ml/min (HCC)   . CAP (community acquired pneumonia) 04/17/2017  . Atrial fibrillation with RVR ([place]) 04/17/2017  . Hallux rigidus, right foot 03/10/2016  . Lumbosacral spondylosis without myelopathy 10/29/2015  . Memory difficulty 09/22/2015  . Abnormality of gait 09/22/2015  . Hyperglycemia 07/06/2015  . Chronic pain 07/06/2015  . Chronic insomnia 03/30/2015  . Transient alteration of awareness 03/30/2015  . Abnormal liver function   . Altered mental status 01/31/2015  . Essential hypertension 01/31/2015  . Constipation 01/31/2015  . Hypothyroidism 01/31/2015  . Seizure disorder ([place]) 01/31/2015  . Bladder outlet obstruction 01/31/2015  . GERD (gastroesophageal reflux disease) 01/31/2015  . Chronic back pain 01/31/2015  . Acute kidney injury ([place]) 01/31/2015   Home Medication(s) Prior to Admission medications   Medication Sig Start Date End Date Taking? Authorizing Provider  Amino Acids-Protein Hydrolys (FEEDING SUPPLEMENT, PRO-STAT SUGAR FREE 64,) LIQD Take 30 mLs by mouth 2 (two) times daily. 07/05/17  Yes Lavina Hamman, MD  apixaban (ELIQUIS) 5 MG TABS tablet Take 1 tablet (5 mg total) by mouth 2 (two) times daily. 06/28/17  Yes Skeet Latch, MD  calcium carbonate (OS-CAL) 600 MG tablet Take 1,200 mg by mouth 2 (two) times daily with a meal.   Yes [provider]  cephALEXin (KEFLEX) 500 MG capsule Take 1 capsule (500 mg total) by mouth 2 (two) times daily for 10 days. 07/25/17 08/04/17 Yes Barrett, Evelene Croon, PA-C  cholecalciferol (VITAMIN D)  1000 units tablet Take 1,000 Units by mouth 2 (two) times daily.   Yes [provider]  Coenzyme Q10 (COQ10) 200 MG CAPS Take 200 mg by mouth at bedtime.   Yes [provider]  cyanocobalamin 500 MCG tablet Take 500 mcg by mouth at bedtime.   Yes [provider]  diltiazem (CARDIZEM CD) 300 MG 24 hr capsule Take 1 capsule (300 mg total) by  mouth daily. 06/28/17  Yes Skeet Latch, MD  divalproex (DEPAKOTE) 250 MG DR tablet Take 3 tablets (750 mg total) by mouth every 12 (twelve) hours. Patient taking differently: Take 250-750 mg by mouth 2 (two) times daily. Take 1 tablet in the morning, and 3 tablets in the evening. 07/09/15  Yes Robbie Lis, MD  docusate sodium (COLACE) 100 MG capsule Take 200 mg by mouth 2 (two) times daily.    Yes [provider]  doxazosin (CARDURA) 4 MG tablet Take 1 tablet (4 mg total) by mouth daily. 06/28/17  Yes Skeet Latch, MD  ferrous sulfate 325 (65 FE) MG tablet Take 1 tablet (325 mg total) by mouth 2 (two) times daily with a meal. 06/28/17  Yes Skeet Latch, MD  furosemide (LASIX) 40 MG tablet Weight daily. If weight < 160 lb, do not give diuretic. If weight 160-170 lb, furosemide 40 mg daily. If weight >170 lb, furosemide 80 mg daily. 07/19/17  Yes [name], DO  gabapentin (NEURONTIN) 300 MG capsule Take 300 mg by mouth 2 (two) times daily.   Yes [provider]  levothyroxine (SYNTHROID, LEVOTHROID) 150 MCG tablet Take 150 mcg by mouth daily before breakfast. 12/27/11  Yes [provider]  metoprolol tartrate (LOPRESSOR) 25 MG tablet Take 1 tablet (25 mg total) by mouth 2 (two) times daily. 06/28/17  Yes Skeet Latch, MD  Multiple Vitamins-Minerals (CERTAVITE/ANTIOXIDANTS) TABS Take 1 tablet by mouth daily.   Yes [provider]  omeprazole (PRILOSEC) 20 MG capsule Take 20 mg by mouth 2 (two) times daily before a meal.  12/23/11  Yes [provider]  Potassium Chloride ER 20 MEQ TBCR Take only when taking lasix. Take 40mEq when taking Lasix 40mg . Take 62mEq when taking Lasix 80mg . Lasix dosing will be based on your daily weight. 07/19/17  Yes [name], DO  pravastatin (PRAVACHOL) 40 MG tablet Take 40 mg by mouth at bedtime.  10/29/14  Yes [provider]  predniSONE (DELTASONE) 10 MG tablet Take 1 tablet (10 mg total) by  mouth daily with breakfast. 04/28/17  Yes Lavina Hamman, MD  senna (SENOKOT) 8.6 MG tablet Take 1 tablet by mouth 2 (two) times daily.    Yes [provider]  vitamin A 8000 UNIT capsule Take 8,000 Units by mouth 2 (two) times daily.   Yes [provider]  vitamin C (ASCORBIC ACID) 500 MG tablet Take 500 mg by mouth every morning.   Yes [provider]  ZETIA 10 MG tablet Take 10 mg by mouth at bedtime.  10/15/11  Yes [provider]  zinc sulfate 220 (50 Zn) MG capsule Take 1 capsule (220 mg total) by mouth every morning. 06/02/17  Yes Mikhail, [place], DO  acetaminophen (TYLENOL) 325 MG tablet Take 650 mg by mouth every 4 (four) hours as needed for mild pain.    [provider]  alendronate (FOSAMAX) 70 MG tablet Take 70 mg by mouth once a week.  01/06/12   [provider]  calcium carbonate (OS-CAL - DOSED IN MG OF ELEMENTAL  CALCIUM) 1250 (500 Ca) MG tablet Take 1 tablet (1,250 mg total) by mouth 2 (two) times daily with a meal. Patient not taking: Reported on 07/29/2017 06/01/17   Cristal Ford, DO  clindamycin (CLEOCIN) 150 MG capsule Take 2 capsules (300 mg total) by mouth every 6 (six) hours for 7 days. 07/29/17 08/05/17  Fatima Blank, MD  collagenase (SANTYL) ointment Apply topically daily. Patient not taking: Reported on 07/29/2017 06/02/17   Cristal Ford, DO  feeding supplement, ENSURE ENLIVE, (ENSURE ENLIVE) LIQD Take 237 mLs by mouth 3 (three) times daily between meals. Patient not taking: Reported on 07/29/2017 06/01/17   Cristal Ford, DO  HYDROcodone-acetaminophen [facility]) 10-325 MG tablet Take 1 tablet by mouth every 6 (six) hours as needed for moderate pain. 07/19/17   [name], DO  lidocaine (LIDODERM) 5 % Place 1 patch onto the skin daily as needed. Remove & Discard patch within 12 hours or as directed by MD Patient taking differently: Place 1 patch onto the skin daily as needed (pain). Remove & Discard patch  within 12 hours or as directed by MD  06/01/17   Cristal Ford, DO  nitroGLYCERIN (NITROSTAT) 0.4 MG SL tablet Place 0.4 mg under the tongue every 5 (five) minutes as needed for chest pain. Reported on 07/20/2015    [provider]                                                                                                                                    Past Surgical History Past Surgical History:  Procedure Laterality Date  . BACK SURGERY    . EYE SURGERY     Bilateral Cataract surgery   . HERNIA REPAIR    . I&D EXTREMITY Right 05/10/2015   Procedure: IRRIGATION AND DEBRIDEMENT EXTREMITY;  Surgeon: Leanora Cover, MD;  Location: [facility];  Service: Orthopedics;  Laterality: Right;  . KNEE ARTHROPLASTY     right knee X 2; left knee once  . LAMINECTOMY     X 6  . POSTERIOR CERVICAL FUSION/FORAMINOTOMY  01/28/2012   Procedure: POSTERIOR CERVICAL FUSION/FORAMINOTOMY LEVEL 3;  Surgeon: [name], MD;  Location: [facility];  Service: Neurosurgery;  Laterality: Left;  Posterior Cervical Five-Thoracic One Fusion, Arthrodesis with LEFT Cervical Seven-thoracic One Laminectomy, Foraminotomy and Resection of Synovial Cyst  . POSTERIOR CERVICAL FUSION/FORAMINOTOMY N/A 01/29/2013   Procedure: POSTERIOR CERVICAL FUSION/FORAMINOTOMY LEVEL 1 and C2-5 Posteriolateral Arthrodesis;  Surgeon: [name], MD;  [facility] [facility];  Service: Neurosurgery;  Laterality: N/A;  C2-C3 Laminectomy C2-C3 posterior cervical arthrodesis  . TONSILLECTOMY     Family History Family History  Problem Relation Age of Onset  . Hypertension Mother   . Cancer Mother   . Kidney failure Father   . Heart disease Father     Social History Social History   Tobacco Use  . Smoking status: Former Research scientist (life sciences)  . Smokeless tobacco:  Never Used  Substance Use Topics  . Alcohol use: Yes    Comment: rare  . Drug use: No   Allergies Demerol [meperidine] and Keppra  [levetiracetam]  Review of Systems Review of Systems  Skin: Positive for rash.   All other systems are reviewed and are negative for acute change except as noted in the HPI  Physical Exam Vital Signs  I have reviewed the triage vital signs BP 130/84 (BP Location: Right Arm)   Pulse 67   Temp 97.9 F (36.6 C) (Oral)   Resp 18   SpO2 99%   Physical Exam  Constitutional: He is oriented to person, place, and time. He appears well-developed and well-nourished. No distress.  HENT:  Head: Normocephalic and atraumatic.  Right Ear: External ear normal.  Left Ear: External ear normal.  Nose: Nose normal.  Mouth/Throat: Mucous membranes are normal. No trismus in the jaw.  Eyes: Conjunctivae and EOM are normal. No scleral icterus.  Neck: Normal range of motion and phonation normal.  Cardiovascular: Normal rate and regular rhythm.  Pulmonary/Chest: Effort normal. No stridor. No respiratory distress.  Abdominal: He exhibits no distension.  Musculoskeletal: Normal range of motion. He exhibits no edema.  Neurological: He is alert and oriented to person, place, and time.  Skin: He is not diaphoretic. There is erythema (to left forearm).     Psychiatric: He has a normal mood and affect. His behavior is normal.  Vitals reviewed.   ED Results and Treatments Labs (all labs ordered are listed, but only abnormal results are displayed) Labs Reviewed  CBC WITH DIFFERENTIAL/PLATELET - Abnormal; Notable for the following components:      Result Value   RBC 3.29 (*)    Hemoglobin 9.9 (*)    HCT 31.9 (*)    RDW 15.6 (*)    All other components within normal limits  COMPREHENSIVE METABOLIC PANEL - Abnormal; Notable for the following components:   Glucose, Bld 113 (*)    BUN 41 (*)    Creatinine, Ser 1.31 (*)    Calcium 8.7 (*)    Total Protein 5.8 (*)    Albumin 3.4 (*)    GFR calc non Af Amer 49 (*)    GFR calc Af Amer 57 (*)    All other components within normal limits  I-STAT CG4  LACTIC ACID, ED                                                                                                                         EKG  EKG Interpretation  Date/Time:    Ventricular Rate:    PR Interval:    QRS Duration:   QT Interval:    QTC Calculation:   R Axis:     Text Interpretation:        Radiology No results found. Pertinent labs & imaging results that were available during my care of the patient were reviewed by me and considered in my medical decision making (see  chart for details).  Medications Ordered in ED Medications  clindamycin (CLEOCIN) capsule 300 mg (300 mg Oral Given 07/29/17 2031)                                                                                                                                    Procedures Procedures  (including critical care time)  Medical Decision Making / ED Course I have reviewed the nursing notes for this encounter and the patient's prior records (if available in EHR or on provided paperwork).    Presentation is concerning for cellulitis of the left forearm.  Patient is currently on Keflex which has not provided any relief.  Screening labs were obtained and grossly reassuring without leukocytosis, significant electrolyte derangements.  Renal function close to patient's baseline.  Patient does not appear to be septic.  Discussed option to change antibiotics and continue treating outpatient, which patient and wife are amenable to.  Given first dose of Clinda here in the emergency department..  Final Clinical Impression(s) / ED Diagnoses Final diagnoses:  Left arm cellulitis    Disposition: Discharge  Condition: Good  I have discussed the results, Dx and Tx plan with the patient and wife who expressed understanding and agree(s) with the plan. Discharge instructions discussed at great length. The patient and wife were given strict return precautions who verbalized understanding of the instructions. No further  questions at time of discharge.    ED Discharge Orders        Ordered    clindamycin (CLEOCIN) 150 MG capsule  Every 6 hours     07/29/17 2123       Follow Up: [name], MD [place] [address] [phone]  Schedule an appointment as soon as possible for a visit  in 3-5 days, If symptoms do not improve or  worsen     This chart was dictated using voice recognition software.  Despite best efforts to proofread,  errors can occur which can change the documentation meaning.   Fatima Blank, MD 07/29/17 2124

## 2017-07-30 ENCOUNTER — Telehealth: Payer: Self-pay | Admitting: *Deleted

## 2017-07-30 NOTE — Telephone Encounter (Signed)
Spoke with patients wife regarding labs done on 07/25/17. Wife explained that patient had been ok'd to change to Dr [employer] as he saw him several times during most recent admission. Patient and wife felt Dr Sallyanne Kuster had really helped with fluid and Furosemide dosing. Patient is currently on a Furosemide sliding scale dose based on daily weights. Dr Oval Linsey had made recommendations on labs done 5/16 however patient was in ED and labs redone yesterday. Patients wife would like for Dr Sallyanne Kuster to review labs to see if any medication changes need to be made. Will forward to Dr Sallyanne Kuster for review

## 2017-07-30 NOTE — Telephone Encounter (Signed)
Advised wife Nia Therapist, sports at [employer]

## 2017-07-30 NOTE — Telephone Encounter (Signed)
Labs reviewed. While the kidney function is minimally worse, I would not consider it a major change. My recommendation would be to continue the same weight based schedule that he was on. Recheck BMET in 2 weeks

## 2017-07-31 ENCOUNTER — Other Ambulatory Visit: Payer: Self-pay | Admitting: *Deleted

## 2017-07-31 NOTE — Patient Outreach (Signed)
[facility]) Care Management  07/31/2017  [name] 03-28-1933 [id number]   Attended onsite IDT meeting with [facility].  Met with patient and wife in room. Patient wife asking about [facility] care management and to clarify home care services.   RNCM explained about that LCSW was following and gave kelly Harrison's name. Wife requests that [facility] call her before her onsite visit. RNCM will let Claiborne Billings know this request.  Wife reports they have all of the DME they need including a hospital bed, w/cs, walkers, home is one level with no steps and they have a walk-in shower.   She will need some private pay assistance, she had this with [facility] earlier this year, wants to have [facility] home care again. They also participated with [facility] home first, I explained this was a home from the hospital program and not home from SNF program, she verbalized understanding.  Explained she can let dcp at facility know of their home care preference.  Also, they may look into [facility] for pill package program, will let LCSW know that they may need to refer to [facility] if medication issues arise.   Will collaborate with [facility] and [facility] CM teams around discharge planning.   Royetta Crochet. Laymond Purser, RN, BSN, [place] [phone]) Business Cell  [phone]) Toll Free Office

## 2017-08-06 DIAGNOSIS — L97312 Non-pressure chronic ulcer of right ankle with fat layer exposed: Secondary | ICD-10-CM | POA: Diagnosis not present

## 2017-08-09 ENCOUNTER — Other Ambulatory Visit: Payer: Self-pay | Admitting: *Deleted

## 2017-08-09 NOTE — Patient Outreach (Signed)
[facility]) Care Management  08/09/2017  MURRELL DOME August 29, 1933 [id number]   CSW called & spoke with patient's wife, [name] (cell#:  [phone]) to touch base regarding discharge plans from [facility]. CSW had received message from [facility] that patient is scheduled to discharge this coming Tuesday, June 4th. Patient's wife states that she is looking forward to him returning home but is afraid it might be too soon as he has had 5 hospital stays since February. Patient's wife to discuss discharge plans with social worker at [facility] this afternoon once patient is done with his appointment to get an epidural for back pain. CSW encouraged wife to call CSW once discharge plans are finalized. CSW will call back next Wednesday if no call until then.    Raynaldo Opitz, LCSW [facility]  Clinical Social Worker cell #: [phone]

## 2017-08-14 ENCOUNTER — Ambulatory Visit: Payer: Self-pay | Admitting: *Deleted

## 2017-08-16 ENCOUNTER — Other Ambulatory Visit: Payer: Self-pay | Admitting: *Deleted

## 2017-08-16 NOTE — Patient Outreach (Signed)
[facility]) Care Management  08/16/2017  DWIGHT ADAMCZAK 06/24/1933 [id number]   CSW called & spoke with patient's wife to discuss discharge plans for patient. Patient's wife informed CSW that they had appealed discharge from [facility] and was approved but has yet to hear back about when the new proposed discharge date will be. Patient's wife has made arrangements with [facility] for [place] and explained to CSW that they are going to setup patient with a bluetooth scale to send his weight to his PCP daily. Patient's wife is looking into [facility] and other agencies for additional assistance at home. CSW offered to send her a list but she declined stating that she already has a few agencies that she has been recommended by word of mouth. Patient's wife to call CSW to update once discharge date has been determined. CSW will follow-up in 1 week incase no response.    Raynaldo Opitz, LCSW [facility]  Clinical Social Worker cell #: [phone]

## 2017-08-20 ENCOUNTER — Encounter (HOSPITAL_BASED_OUTPATIENT_CLINIC_OR_DEPARTMENT_OTHER): Payer: Medicare Other | Attending: Internal Medicine

## 2017-08-20 DIAGNOSIS — I739 Peripheral vascular disease, unspecified: Secondary | ICD-10-CM | POA: Insufficient documentation

## 2017-08-20 DIAGNOSIS — L97812 Non-pressure chronic ulcer of other part of right lower leg with fat layer exposed: Secondary | ICD-10-CM | POA: Diagnosis not present

## 2017-08-20 DIAGNOSIS — I2510 Atherosclerotic heart disease of native coronary artery without angina pectoris: Secondary | ICD-10-CM | POA: Insufficient documentation

## 2017-08-20 DIAGNOSIS — L97822 Non-pressure chronic ulcer of other part of left lower leg with fat layer exposed: Secondary | ICD-10-CM | POA: Diagnosis not present

## 2017-08-20 DIAGNOSIS — G4730 Sleep apnea, unspecified: Secondary | ICD-10-CM | POA: Diagnosis not present

## 2017-08-20 DIAGNOSIS — I10 Essential (primary) hypertension: Secondary | ICD-10-CM | POA: Diagnosis not present

## 2017-08-23 ENCOUNTER — Ambulatory Visit: Payer: Self-pay | Admitting: *Deleted

## 2017-08-26 ENCOUNTER — Other Ambulatory Visit: Payer: Self-pay | Admitting: *Deleted

## 2017-08-27 NOTE — Patient Outreach (Signed)
[facility]) Care Management  08/27/2017  [name] 12/14/1933 [id number]   CSW received notification from [name] that patient discharged home from [facility] this past Saturday, 6/15. CSW called & spoke with patient's wife, [name] who informed CSW that patient has been doing well since returning home but he has been having difficulty standing on the scale provided by [name]. Patient's wife has a call out to [name] to address those concerns. Patient's wife reports that they had just returned from an appointment with [name] this afternoon and she was glad that everything checked out ok and they were able to weigh him and make sure he is taking the correct dosage for lasix. Patient's wife states that she is trying to decide on whether to hire an independent private duty nurse or go through an agency. CSW to make referral to [facility] for transition of care & CSW will sign off at no further CSW needs identified at this time.    Raynaldo Opitz, LCSW [facility]  Clinical Social Worker cell #: [phone]

## 2017-08-28 ENCOUNTER — Other Ambulatory Visit: Payer: Self-pay

## 2017-08-28 NOTE — Patient Outreach (Signed)
[facility]) Care Management  08/28/2017  [name] 1933/03/14 [id number]  Referral received 08/27/17 per Raynaldo Opitz, LCSW. Client discharged from [facility] skilled facility on 6/15.  84 year old with history of HTN, seizure disorder, Heart failure, CKD, Paroxysmal atrial fibrillation.  RNCM called to follow up. Client answered the phone. Two patient identifiers confirmed. Client denies any issues at this time. Client refers the call to his wife, but states his wife is not in and request RNCM call back.  Plan: follow up within the next 1-2 days.  Thea Silversmith, RN, MSN, [place] Coordinator Cell: [phone]

## 2017-08-29 ENCOUNTER — Telehealth: Payer: Self-pay | Admitting: Physician Assistant

## 2017-08-29 ENCOUNTER — Other Ambulatory Visit: Payer: Self-pay

## 2017-08-29 NOTE — Telephone Encounter (Signed)
Spoke with wife , she would like to change home health care [facility] to [facility]  wife aware  Cardiology=y can place an order for [facility] to come patient's home , she is aware that homecare may not be able to come only for lab draws. RN informed wife will contact homecare tomorrow. Per Suanne Marker PA- can see if labs can be done on 09/04/17 for follow up appointment on 09/06/17  Verbalized understanding.

## 2017-08-29 NOTE — Telephone Encounter (Signed)
New Message    Patients wife calling to see if Rosaria Ferries can reach out to [facility]. They are trying to set up home health and with that to allow [facility] to draw labs.

## 2017-08-29 NOTE — Telephone Encounter (Signed)
Spoke with pt's wife and pt unable to get on scale to weigh at this time and Furosemide and KCL are given based on weight .  Per pt's wife pt was at PMD yesterday and they suggested measuring calf or stomach and to  give meds accordingly . Discussed with Rosaria Ferries PA and that is not real accurate finding to  have pt take those meds.Per Suanne Marker have pt take  Furosemide 40 mg  and KCL 20 meq every other day and repeat BMEt next week and will see pt in clinic on Friday 09/06/17 at 10:00 am the patient's wife may have [facility] coming to see pt and if she does will call back so can have that agency draw BMET and send a order.  If not will draw BMET on Friday after visit ./cy  Pt's wife aware of med changes and appt time./cy

## 2017-08-29 NOTE — Telephone Encounter (Signed)
New Message   Pt c/o medication issue:  1. Name of Medication: lasix and potassium   2. How are you currently taking this medication (dosage and times per day)? Weight daily. If weight < 160 lb, do not give diuretic. If weight 160-170 lb, furosemide 40 mg daily. If weight >170 lb, furosemide 80 mg daily. Take only when taking lasix. Take 64mEq when taking Lasix 40mg . Take 73mEq when taking Lasix 80mg . Lasix dosing will be based on your daily weight.  3. Are you having a reaction (difficulty breathing--STAT)? no  4. What is your medication issue? Pt's wife is calling, states that she was told to measure the pt with a tape measure due to the pt not beong able to stand up and get on the scale, and she wants to know what measure should she double his lasix an potasium. Please call

## 2017-08-29 NOTE — Patient Outreach (Signed)
[facility]) Care Management  08/29/2017  [name] 21-Jul-1933 [id number]  Referral received 6/18 per Raynaldo Opitz, LCSW: client discharged from skilled facility-[facility] on 6/15.  84 year old with history of HTN, Heart failure, seizure disorder, CKD, Paroxysmal atrial fibrillation.  RNCM called to follow up. Spoke with client's wife. [name] reports that upon leaving [facility] she chose [facility] home health, but have since changed her mind and is transitioning to [facility] home [facility]. She reports client is unable to weigh due to he is unable to stand on one leg and balance long enough for the scale to register a weight. [name] expressed frustration regarding starting with one agency for the tele-health monitoring and being unable to complete due to client is not steady long enough for weight to register.   She reports client saw Primary care on yesterday and spoke with someone in the cardiology office today regarding inability to obtain weights. She also states that primary care provider was informed. Per Robert. [name] options available for monitoring include laboratory draws and measurements.    Client has an upcoming appointment with Cardiologist-June 28  Wound care center managing care of right leg wound  [name] did not want to go into medications in detail, but is receptive to pharmacy referral for medication reconciliation as client is on greater than 10 medications and was just discharged from skilled facility.  Plan: home visit next week.  Thea Silversmith, RN, MSN, [place] Coordinator Cell: [phone]

## 2017-09-02 NOTE — Telephone Encounter (Signed)
Spoke to wife. Wife states she is awaiting for primary to contact [facility] - for homecare. Wife states [facility] is no longer seeing patient.  WIFE IS AWARE  ORDERS CAN NOT BE GIVEN T [facility] FOR LAB DRAW UNTIL ESTABLISH.  IF HOME HEALTHCARE CAN NOT BE ESTABLISH UNTIL OFFICE APPOINTMENT - LABS WILL BE DONE PRIOR TO OFFICE APPOINTMENT 09/06/17.   WILL CONTACT TOMORROW AFTERNOON- 09/04/17.

## 2017-09-03 DIAGNOSIS — L97822 Non-pressure chronic ulcer of other part of left lower leg with fat layer exposed: Secondary | ICD-10-CM | POA: Diagnosis not present

## 2017-09-04 ENCOUNTER — Other Ambulatory Visit: Payer: Self-pay

## 2017-09-04 ENCOUNTER — Telehealth: Payer: Self-pay | Admitting: Physician Assistant

## 2017-09-04 MED ORDER — APIXABAN 5 MG PO TABS: 5.0000 mg | ORAL_TABLET | Freq: Two times a day (BID) | ORAL | 3 refills | Status: DC

## 2017-09-04 NOTE — Telephone Encounter (Signed)
New message       Pt c/o medication issue:  1. Name of Medication: apixaban (ELIQUIS) 5 MG TABS tablet  2. How are you currently taking this medication (dosage and times per day)?  3. Are you having a reaction (difficulty breathing--STAT)? no  4. What is your medication issue? [facility] nurse calling to report patient is not taking medications.

## 2017-09-04 NOTE — Patient Outreach (Signed)
[facility]) Care Management  09/04/2017  SUSUMU HACKLER 1934-02-14 [id number]   Care Coordination. Per chart, Apixaban is listed on medication record. Client has a history of atrial fibrillation. Client recently discharged from [facility] skilled facility. Neither client or his wife is clear on when he had his last dose or which provider may have stopped this medication. Client has seen primary care since discharge, but RNCM does not have access to those records and client does not have an after visit summary. Client has an appointment with cardiology this Friday.   RNCM called cardiology office-spoke with Alwyn Ren and informed of above.   Plan: continue to follow.  Thea Silversmith, RN, MSN, [place] Coordinator Cell: [phone]

## 2017-09-04 NOTE — Telephone Encounter (Signed)
Spoke with pt's wife who states that when pt was discharged from [facility] they didn't have Eliquis on his medication list so she was unsure if he was suppose to still be taking it. Spoke with Rosaria Ferries, PA who confirmed pt is to still be taking the medication. Verbalized understanding.

## 2017-09-05 NOTE — Patient Outreach (Signed)
[facility]) Care Management   09/05/2017  AZIM GILLINGHAM Jul 29, 1933 [id number]  [name] is an 84 y.o. male  Subjective: client reports he would like to return to taking some of the medications he was on before his medications were changed. He states he is going to talk with his primary care provider about this.  Objective:  BP 114/68   Pulse 68   Resp 20   Ht 1.753 m (5\' 9" )   SpO2 96%   BMI 22.98 kg/m   Review of Systems  Respiratory:       Lungs decreased.  Cardiovascular:       Heart rate slightly irregular. S1S2 noted.  Skin:       Discoloration noted to left arm, poor skin turgor.    Physical Exam skin warm dry, color within normal limits.  Encounter Medications:   Outpatient Encounter Medications as of 09/04/2017  Medication Sig Note  . acetaminophen (TYLENOL) 325 MG tablet Take 650 mg by mouth every 4 (four) hours as needed for mild pain.   Marland Kitchen alendronate (FOSAMAX) 70 MG tablet Take 70 mg by mouth once a week.    . Amino Acids-Protein Hydrolys (FEEDING SUPPLEMENT, PRO-STAT SUGAR FREE 64,) LIQD Take 30 mLs by mouth 2 (two) times daily.   . calcium carbonate (OS-CAL - DOSED IN MG OF ELEMENTAL CALCIUM) 1250 (500 Ca) MG tablet Take 1 tablet (1,250 mg total) by mouth 2 (two) times daily with a meal.   . calcium carbonate (OS-CAL) 600 MG tablet Take 1,200 mg by mouth 2 (two) times daily with a meal.   . cholecalciferol (VITAMIN D) 1000 units tablet Take 1,000 Units by mouth 2 (two) times daily. 09/04/2017: Has 134mcg (5000 IU) tablets. Has been taking one tablet twice a day.  . Coenzyme Q10 (COQ10) 200 MG CAPS Take 200 mg by mouth at bedtime.   . collagenase (SANTYL) ointment Apply topically daily.   . cyanocobalamin 500 MCG tablet Take 500 mcg by mouth at bedtime.   Marland Kitchen diltiazem (CARDIZEM CD) 300 MG 24 hr capsule Take 1 capsule (300 mg total) by mouth daily.   . divalproex (DEPAKOTE) 250 MG DR tablet Take 3 tablets (750 mg total) by mouth every 12  (twelve) hours. (Patient taking differently: Take 250-750 mg by mouth 2 (two) times daily. Take 1 tablet in the morning, and 3 tablets in the evening.)   . docusate sodium (COLACE) 100 MG capsule Take 200 mg by mouth 2 (two) times daily.  09/04/2017: Takes as needed.  . doxazosin (CARDURA) 4 MG tablet Take 1 tablet (4 mg total) by mouth daily.   . ferrous sulfate 325 (65 FE) MG tablet Take 1 tablet (325 mg total) by mouth 2 (two) times daily with a meal.   . furosemide (LASIX) 40 MG tablet Weight daily. If weight < 160 lb, do not give diuretic. If weight 160-170 lb, furosemide 40 mg daily. If weight >170 lb, furosemide 80 mg daily.   Marland Kitchen gabapentin (NEURONTIN) 300 MG capsule Take 300 mg by mouth 2 (two) times daily.   Marland Kitchen HYDROcodone-acetaminophen (NORCO) 10-325 MG tablet Take 1 tablet by mouth every 6 (six) hours as needed for moderate pain.   Marland Kitchen levothyroxine (SYNTHROID, LEVOTHROID) 150 MCG tablet Take 150 mcg by mouth daily before breakfast.   . lidocaine (LIDODERM) 5 % Place 1 patch onto the skin daily as needed. Remove & Discard patch within 12 hours or as directed by MD (Patient taking differently: Place 1  patch onto the skin daily as needed (pain). Remove & Discard patch within 12 hours or as directed by MD ) 07/11/2017: No patch on currently  . metoprolol tartrate (LOPRESSOR) 25 MG tablet Take 1 tablet (25 mg total) by mouth 2 (two) times daily.   . Multiple Vitamins-Minerals (CERTAVITE/ANTIOXIDANTS) TABS Take 1 tablet by mouth daily.   . nitroGLYCERIN (NITROSTAT) 0.4 MG SL tablet Place 0.4 mg under the tongue every 5 (five) minutes as needed for chest pain. Reported on 07/20/2015   . omeprazole (PRILOSEC) 20 MG capsule Take 20 mg by mouth 2 (two) times daily before a meal.  09/04/2017: Reports taking once a day.  . Potassium Chloride ER 20 MEQ TBCR Take only when taking lasix. Take 25mEq when taking Lasix 40mg . Take 59mEq when taking Lasix 80mg . Lasix dosing will be based on your daily weight.   .  pravastatin (PRAVACHOL) 40 MG tablet Take 40 mg by mouth at bedtime.    . predniSONE (DELTASONE) 10 MG tablet Take 1 tablet (10 mg total) by mouth daily with breakfast. 07/11/2017: Continuous   . senna (SENOKOT) 8.6 MG tablet Take 1 tablet by mouth 2 (two) times daily.  09/04/2017: Takes as needed.  . vitamin A 8000 UNIT capsule Take 8,000 Units by mouth 2 (two) times daily.   . vitamin C (ASCORBIC ACID) 500 MG tablet Take 500 mg by mouth every morning.   Marland Kitchen ZETIA 10 MG tablet Take 10 mg by mouth at bedtime.  09/04/2017: Per wife, he is has 10 mg tablets and is taking 1/2 tablet.   . feeding supplement, ENSURE ENLIVE, (ENSURE ENLIVE) LIQD Take 237 mLs by mouth 3 (three) times daily between meals. (Patient not taking: Reported on 07/29/2017)   . zinc sulfate 220 (50 Zn) MG capsule Take 1 capsule (220 mg total) by mouth every morning. (Patient not taking: Reported on 09/04/2017)   . [DISCONTINUED] apixaban (ELIQUIS) 5 MG TABS tablet Take 1 tablet (5 mg total) by mouth 2 (two) times daily. (Patient not taking: Reported on 09/04/2017)    No facility-administered encounter medications on file as of 09/04/2017.     Functional Status:   In your present state of health, do you have any difficulty performing the following activities: 09/04/2017 07/11/2017  Hearing? N N  Vision? N N  Difficulty concentrating or making decisions? N Y  Comment - -  Walking or climbing stairs? Y Y  Dressing or bathing? Y Y  Doing errands, shopping? Y Y  In the past six months, have you accidently leaked urine? Y -  Comment occasionlally more controlled now the more I am at home. -  Do you have problems with loss of bowel control? Y -  Comment occasionlly, but more controlled since being home. -  Some recent data might be hidden    Fall/Depression Screening:    Fall Risk  09/04/2017  Falls in the past year? No   PHQ 2/9 Scores 09/04/2017  PHQ - 2 Score 0    Assessment:  Referral received 6/18 per Raynaldo Opitz, LCSW:  client discharged from skilled facility-[facility] on 6/15.  84 year old with history of HTN, Heart failure, seizure disorder, CKD, Paroxysmal atrial fibrillation.  Client is transitioning to a different home health agency then originally set up at rehabilitation facility. Client's preference of [facility]. RNCM called [facility] home health. Spoke with Jinny Blossom who reports they have received orders for home health from primary care, but are in need of what disciplines the primary care is  ordering to be involved. Jinny Blossom states she has a call into the primary care office and as soon as she gets the specific order, she will call the client.  Medication discrepancy: -Apixaban noted in chart on medication list, but client does not have Apixaban and is not taking. Neither Mrs. Or Mr. Winfree was aware and does not know when this medication was stopped.  -Vitamn D3 per chart he is to take 1000IU twice a day. Client is taking Vitamin D3 162mcg(5000IU twice a day.  Client refused [place] referral for medication reconciliation stating he will talk with [name] himself.  Per chart, Cardiology visit scheduled for this Friday.  Plan: follow up within the next 2-3 weeks.  THN CM Care Plan Problem One     Most Recent Value  Care Plan Problem One  at risk for readmission.  (Pended)   Role Documenting the Problem One  Care Management Coordinator  (Pended)   [place] for Problem One  Active  (Pended)   THN Long Term Goal   client will not be readmitted within the next 31 days.  (Pended)   THN Long Term Goal Start Date  08/29/17  (Pended)   Interventions for Problem One Long Term Goal  clieint reviewed medications, called cardiologist regarding discrepancy, provided [facility] calender/organizer and explalined how to use, provided RNCM's contact number and enocuraged to call as needed, also encouraged to call the 24 hour nurse advice line as needed.  (Pended)   THN CM Short Term Goal #1   client will attend follow up  appointments as scheduled within   (Pended)   THN CM Short Term Goal #1 Start Date  08/29/17  (Pended)   Interventions for Short Term Goal #1  discused upcoming appointment with client and his wife.  (Pended)   THN CM Short Term Goal #2   client/family will contact RNCM and/or 24 hour nurse advice line with health questions/concerns as needed within the next 30 days.  (Pended)   THN CM Short Term Goal #2 Start Date  08/29/17  (Pended)   Interventions for Short Term Goal #2  confirmed that they have the contact numbers.  (Pended)     THN CM Care Plan Problem Two     Most Recent Value  Care Plan Problem Two  knowledge deficit regarding atrial fibrillation as evidence by wife asking for more information.  (Pended)   Role Documenting the Problem Two  Care Management Coordinator  (Pended)   Care Plan for Problem Two  Active  (Pended)   THN Long Term Goal  client/wife will verbalize action plan for atrial fibrillation within the next 31-60 days.  (Pended)   THN Long Term Goal Start Date  09/04/17  (Pended)      Thea Silversmith, RN, MSN, [place] Coordinator Cell: [phone]

## 2017-09-06 ENCOUNTER — Encounter: Payer: Self-pay | Admitting: Physician Assistant

## 2017-09-06 ENCOUNTER — Ambulatory Visit (INDEPENDENT_AMBULATORY_CARE_PROVIDER_SITE_OTHER): Payer: Medicare Other | Admitting: Physician Assistant

## 2017-09-06 VITALS — BP 132/64 | HR 98 | Ht 69.0 in | Wt 169.0 lb

## 2017-09-06 DIAGNOSIS — N183 Chronic kidney disease, stage 3 unspecified: Secondary | ICD-10-CM

## 2017-09-06 DIAGNOSIS — Z79899 Other long term (current) drug therapy: Secondary | ICD-10-CM

## 2017-09-06 DIAGNOSIS — E785 Hyperlipidemia, unspecified: Secondary | ICD-10-CM | POA: Diagnosis not present

## 2017-09-06 DIAGNOSIS — I10 Essential (primary) hypertension: Secondary | ICD-10-CM

## 2017-09-06 DIAGNOSIS — I481 Persistent atrial fibrillation: Secondary | ICD-10-CM

## 2017-09-06 DIAGNOSIS — I5032 Chronic diastolic (congestive) heart failure: Secondary | ICD-10-CM

## 2017-09-06 DIAGNOSIS — I2510 Atherosclerotic heart disease of native coronary artery without angina pectoris: Secondary | ICD-10-CM

## 2017-09-06 DIAGNOSIS — Z7901 Long term (current) use of anticoagulants: Secondary | ICD-10-CM

## 2017-09-06 DIAGNOSIS — I4819 Other persistent atrial fibrillation: Secondary | ICD-10-CM

## 2017-09-06 LAB — COMPREHENSIVE METABOLIC PANEL
ALT: 21 IU/L (ref 0–44)
AST: 15 IU/L (ref 0–40)
Albumin/Globulin Ratio: 2.5 — ABNORMAL HIGH (ref 1.2–2.2)
Albumin: 4 g/dL (ref 3.5–4.7)
Alkaline Phosphatase: 51 IU/L (ref 39–117)
BUN/Creatinine Ratio: 38 — ABNORMAL HIGH (ref 10–24)
BUN: 44 mg/dL — ABNORMAL HIGH (ref 8–27)
Bilirubin Total: 0.4 mg/dL (ref 0.0–1.2)
CO2: 25 mmol/L (ref 20–29)
Calcium: 8.9 mg/dL (ref 8.6–10.2)
Chloride: 105 mmol/L (ref 96–106)
Creatinine, Ser: 1.15 mg/dL (ref 0.76–1.27)
GFR calc Af Amer: 69 mL/min/{1.73_m2} (ref >59)
GFR calc non Af Amer: 59 mL/min/{1.73_m2} — ABNORMAL LOW (ref >59)
Globulin, Total: 1.6 g/dL (ref 1.5–4.5)
Glucose: 107 mg/dL — ABNORMAL HIGH (ref 65–99)
Potassium: 4.8 mmol/L (ref 3.5–5.2)
Sodium: 145 mmol/L — ABNORMAL HIGH (ref 134–144)
Total Protein: 5.6 g/dL — ABNORMAL LOW (ref 6.0–8.5)

## 2017-09-06 NOTE — Patient Instructions (Signed)
Medication Instructions:  Your physician recommends that you continue on your current medications as directed. Please refer to the Current Medication list given to you today.   Labwork: Your physician recommends that you return for lab work in: TODAY (CMET)   Testing/Procedures: none  Follow-Up: Keep scheduled follow up with Dr. Loletha Grayer in August.   Any Other Special Instructions Will Be Listed Below (If Applicable). ** Continue with Daily Weights  ** Keep all fluids to 1-2 quarts daily  Low-Sodium Eating Plan Sodium, which is an element that makes up salt, helps you maintain a healthy balance of fluids in your body. Too much sodium can increase your blood pressure and cause fluid and waste to be held in your body. Your health care provider or dietitian may recommend following this plan if you have high blood pressure (hypertension), kidney disease, liver disease, or heart failure. Eating less sodium can help lower your blood pressure, reduce swelling, and protect your heart, liver, and kidneys. What are tips for following this plan? General guidelines  Most people on this plan should limit their sodium intake to 1,500-2,000 mg (milligrams) of sodium each day. Reading food labels  The Nutrition Facts label lists the amount of sodium in one serving of the food. If you eat more than one serving, you must multiply the listed amount of sodium by the number of servings.  Choose foods with less than 140 mg of sodium per serving.  Avoid foods with 300 mg of sodium or more per serving. Shopping  Look for lower-sodium products, often labeled as "low-sodium" or "no salt added."  Always check the sodium content even if foods are labeled as "unsalted" or "no salt added".  Buy fresh foods. ? Avoid canned foods and premade or frozen meals. ? Avoid canned, cured, or processed meats  Buy breads that have less than 80 mg of sodium per slice. Cooking  Eat more home-cooked food and less  restaurant, buffet, and fast food.  Avoid adding salt when cooking. Use salt-free seasonings or herbs instead of table salt or sea salt. Check with your health care provider or pharmacist before using salt substitutes.  Cook with plant-based oils, such as canola, sunflower, or olive oil. Meal planning  When eating at a restaurant, ask that your food be prepared with less salt or no salt, if possible.  Avoid foods that contain MSG (monosodium glutamate). MSG is sometimes added to [place] food, bouillon, and some canned foods. What foods are recommended? The items listed may not be a complete list. Talk with your dietitian about what dietary choices are best for you. Grains Low-sodium cereals, including oats, puffed wheat and rice, and shredded wheat. Low-sodium crackers. Unsalted rice. Unsalted pasta. Low-sodium bread. Whole-grain breads and whole-grain pasta. Vegetables Fresh or frozen vegetables. "No salt added" canned vegetables. "No salt added" tomato sauce and paste. Low-sodium or reduced-sodium tomato and vegetable juice. Fruits Fresh, frozen, or canned fruit. Fruit juice. Meats and other protein foods Fresh or frozen (no salt added) meat, poultry, seafood, and fish. Low-sodium canned tuna and salmon. Unsalted nuts. Dried peas, beans, and lentils without added salt. Unsalted canned beans. Eggs. Unsalted nut butters. Dairy Milk. Soy milk. Cheese that is naturally low in sodium, such as ricotta cheese, fresh mozzarella, or Swiss cheese Low-sodium or reduced-sodium cheese. Cream cheese. Yogurt. Fats and oils Unsalted butter. Unsalted margarine with no trans fat. Vegetable oils such as canola or olive oils. Seasonings and other foods Fresh and dried herbs and spices. Salt-free seasonings. Low-sodium  mustard and ketchup. Sodium-free salad dressing. Sodium-free light mayonnaise. Fresh or refrigerated horseradish. Lemon juice. Vinegar. Homemade, reduced-sodium, or low-sodium soups. Unsalted  popcorn and pretzels. Low-salt or salt-free chips. What foods are not recommended? The items listed may not be a complete list. Talk with your dietitian about what dietary choices are best for you. Grains Instant hot cereals. Bread stuffing, pancake, and biscuit mixes. Croutons. Seasoned rice or pasta mixes. Noodle soup cups. Boxed or frozen macaroni and cheese. Regular salted crackers. Self-rising flour. Vegetables Sauerkraut, pickled vegetables, and relishes. Olives. [place] fries. Onion rings. Regular canned vegetables (not low-sodium or reduced-sodium). Regular canned tomato sauce and paste (not low-sodium or reduced-sodium). Regular tomato and vegetable juice (not low-sodium or reduced-sodium). Frozen vegetables in sauces. Meats and other protein foods Meat or fish that is salted, canned, smoked, spiced, or pickled. Bacon, ham, sausage, hotdogs, corned beef, chipped beef, packaged lunch meats, salt pork, jerky, pickled herring, anchovies, regular canned tuna, sardines, salted nuts. Dairy Processed cheese and cheese spreads. Cheese curds. Blue cheese. Feta cheese. String cheese. Regular cottage cheese. Buttermilk. Canned milk. Fats and oils Salted butter. Regular margarine. Ghee. Bacon fat. Seasonings and other foods Onion salt, garlic salt, seasoned salt, table salt, and sea salt. Canned and packaged gravies. Worcestershire sauce. Tartar sauce. Barbecue sauce. Teriyaki sauce. Soy sauce, including reduced-sodium. Steak sauce. Fish sauce. Oyster sauce. Cocktail sauce. Horseradish that you find on the shelf. Regular ketchup and mustard. Meat flavorings and tenderizers. Bouillon cubes. Hot sauce and Tabasco sauce. Premade or packaged marinades. Premade or packaged taco seasonings. Relishes. Regular salad dressings. Salsa. Potato and tortilla chips. Corn chips and puffs. Salted popcorn and pretzels. Canned or dried soups. Pizza. Frozen entrees and pot pies. Summary  Eating less sodium can help lower  your blood pressure, reduce swelling, and protect your heart, liver, and kidneys.  Most people on this plan should limit their sodium intake to 1,500-2,000 mg (milligrams) of sodium each day.  Canned, boxed, and frozen foods are high in sodium. Restaurant foods, fast foods, and pizza are also very high in sodium. You also get sodium by adding salt to food.  Try to cook at home, eat more fresh fruits and vegetables, and eat less fast food, canned, processed, or prepared foods. This information is not intended to replace advice given to you by your health care provider. Make sure you discuss any questions you have with your health care provider. Document Released: 08/18/2001 Document Revised: 02/20/2016 Document Reviewed: 02/20/2016 Elsevier Interactive Patient Education  [employer].     If you need a refill on your cardiac medications before your next appointment, please call your pharmacy.

## 2017-09-06 NOTE — Progress Notes (Signed)
Cardiology Office Note   Date:  09/06/2017   ID:  [name], DOB 1933/04/23, MRN [id number]  PCP:  Robert Battles, MD  Cardiologist: Dr. Sallyanne Kuster, 07/19/2017 in hospital Rosaria Ferries, PA-C 07/25/2017   History of Present Illness: [name] is a 84 y.o. male with a history of  RCA PCI 2002,HTN, HLD, GERD, OA, vertigo,chronic right leg wound, seizures, memory loss, D-CHF &atrial fib dx 04/2017 on Eliquis w/ CHA2DS2-VASc = 5 (age x 2, CAD, HTN, CHF), dementia, hypothyroid, OSA not on CPAP, DNR, dysphagia 3 diet w/ nectar thick liquids, AKI 2nd dehydration and Lasix, patient on weight-based Lasix when out of facility, is now home  5/20 ER visit for cellulitis left forearm, Keflex no help, clindamycin used Patient was at [facility], discharged home 6/15 6/20 phone notes regarding concerns about Lasix and potassium doses, and home health agency>>[facility]  [name] presents for cardiology follow up.  He has been doing fairly well since going home.   Dr Philip Aspen told him ok to take 1/2 of the Zetia, he is doing that.   He is taking the omeprazole only once a day, no GERD sx.   He is eating better at home, did not like the food at [facility].  He will be getting PT 3 x week.  He feels he is making some progress.  He is being followed by wound care, his wounds are improving, but slowly.  He has a walker, uses it at home. Has problems standing on scales at home, cannot balance well and gets error messages.   He feels his breathing is at baseline.  Has minimal lower extremity edema, is wearing compression socks.  He denies PND.  Sleeps on extra pillows chronically because of a bad back.  She is measuring his left calf daily.  It started out at 12 and three-quarter inches, is up to 13-1/4 inches.  He has been getting the Lasix and potassium every other day.  No chest pain, no palpitations.  No awareness of the atrial fibrillation.  No presyncope.  He is taking so  many pills a day, she wonders if he needs all of the vitamins and supplements.   Past Medical History:  Diagnosis Date  . Abnormality of gait 09/22/2015  . Arthritis   . Atrial fibrillation ([place])   . CAD (coronary artery disease)    Stent to RCA, Penta stent, 99% reduced to 0% 2002.  . Cancer ([place])    skin CA removed from back  . Chronic insomnia 03/30/2015  . Complication of anesthesia    trouble waking up  . Constipation    from medications taken  . GERD (gastroesophageal reflux disease)   . Hypercholesteremia   . Hypertension   . Hypothyroidism   . Memory difficulty 09/22/2015  . Osteoarthritis   . Seizures ([place])   . Sepsis ([place]) 05/2017  . Transient alteration of awareness 03/30/2015  . Vertigo    hx of    Past Surgical History:  Procedure Laterality Date  . BACK SURGERY    . EYE SURGERY     Bilateral Cataract surgery   . HERNIA REPAIR    . I&D EXTREMITY Right 05/10/2015   Procedure: IRRIGATION AND DEBRIDEMENT EXTREMITY;  Surgeon: Leanora Cover, MD;  Location: [place];  Service: Orthopedics;  Laterality: Right;  . KNEE ARTHROPLASTY     right knee X 2; left knee once  . LAMINECTOMY     X 6  . POSTERIOR CERVICAL  FUSION/FORAMINOTOMY  01/28/2012   Procedure: POSTERIOR CERVICAL FUSION/FORAMINOTOMY LEVEL 3;  Surgeon: [name], MD;  Location: [place] [facility];  Service: Neurosurgery;  Laterality: Left;  Posterior Cervical Five-Thoracic One Fusion, Arthrodesis with LEFT Cervical Seven-thoracic One Laminectomy, Foraminotomy and Resection of Synovial Cyst  . POSTERIOR CERVICAL FUSION/FORAMINOTOMY N/A 01/29/2013   Procedure: POSTERIOR CERVICAL FUSION/FORAMINOTOMY LEVEL 1 and C2-5 Posteriolateral Arthrodesis;  Surgeon: [name], MD;  Location: [place] [facility];  Service: Neurosurgery;  Laterality: N/A;  C2-C3 Laminectomy C2-C3 posterior cervical arthrodesis  . TONSILLECTOMY      Current Outpatient Medications  Medication Sig Dispense Refill  . Amino  Acids-Protein Hydrolys (FEEDING SUPPLEMENT, PRO-STAT SUGAR FREE 64,) LIQD Take 30 mLs by mouth 2 (two) times daily. 900 mL 0  . apixaban (ELIQUIS) 5 MG TABS tablet Take 1 tablet (5 mg total) by mouth 2 (two) times daily. 180 tablet 3  . calcium carbonate (OS-CAL) 600 MG tablet Take 1,200 mg by mouth 2 (two) times daily with a meal.    . cholecalciferol (VITAMIN D) 1000 units tablet Take 1,000 Units by mouth 2 (two) times daily.    . Coenzyme Q10 (COQ10) 200 MG CAPS Take 200 mg by mouth at bedtime.    . collagenase (SANTYL) ointment Apply topically daily. 15 g 0  . diltiazem (CARDIZEM CD) 300 MG 24 hr capsule Take 1 capsule (300 mg total) by mouth daily. 30 capsule 0  . divalproex (DEPAKOTE) 250 MG DR tablet Take 3 tablets (750 mg total) by mouth every 12 (twelve) hours. (Patient taking differently: Take 250-750 mg by mouth 2 (two) times daily. Take 1 tablet in the morning, and 3 tablets in the evening.) 60 tablet 0  . docusate sodium (COLACE) 100 MG capsule Take 200 mg by mouth 2 (two) times daily.     Marland Kitchen doxazosin (CARDURA) 4 MG tablet Take 1 tablet (4 mg total) by mouth daily. 30 tablet 0  . feeding supplement, ENSURE ENLIVE, (ENSURE ENLIVE) LIQD Take 237 mLs by mouth 3 (three) times daily between meals. 90 Bottle 0  . ferrous sulfate 325 (65 FE) MG tablet Take 1 tablet (325 mg total) by mouth 2 (two) times daily with a meal. 60 tablet 0  . furosemide (LASIX) 40 MG tablet Weight daily. If weight < 160 lb, do not give diuretic. If weight 160-170 lb, furosemide 40 mg daily. If weight >170 lb, furosemide 80 mg daily. 30 tablet 0  . gabapentin (NEURONTIN) 300 MG capsule Take 300 mg by mouth 2 (two) times daily.    Marland Kitchen HYDROcodone-acetaminophen (NORCO) 10-325 MG tablet Take 1 tablet by mouth every 6 (six) hours as needed for moderate pain. 8 tablet 0  . levothyroxine (SYNTHROID, LEVOTHROID) 150 MCG tablet Take 150 mcg by mouth daily before breakfast.    . lidocaine (LIDODERM) 5 % Place 1 patch onto the  skin daily as needed. Remove & Discard patch within 12 hours or as directed by MD (Patient taking differently: Place 1 patch onto the skin daily as needed (pain). Remove & Discard patch within 12 hours or as directed by MD ) 30 patch 0  . metoprolol tartrate (LOPRESSOR) 25 MG tablet Take 1 tablet (25 mg total) by mouth 2 (two) times daily. 60 tablet 0  . Multiple Vitamins-Minerals (CERTAVITE/ANTIOXIDANTS) TABS Take 1 tablet by mouth daily.    . nitroGLYCERIN (NITROSTAT) 0.4 MG SL tablet Place 0.4 mg under the tongue every 5 (five) minutes as needed for chest pain. Reported on 07/20/2015    .  omeprazole (PRILOSEC) 20 MG capsule Take 20 mg by mouth 2 (two) times daily before a meal.     . Potassium Chloride ER 20 MEQ TBCR Take only when taking lasix. Take 89mEq when taking Lasix 40mg . Take 41mEq when taking Lasix 80mg . Lasix dosing will be based on your daily weight. 30 tablet 0  . pravastatin (PRAVACHOL) 40 MG tablet Take 40 mg by mouth at bedtime.   2  . predniSONE (DELTASONE) 10 MG tablet Take 1 tablet (10 mg total) by mouth daily with breakfast.    . senna (SENOKOT) 8.6 MG tablet Take 1 tablet by mouth 2 (two) times daily.     . vitamin A 8000 UNIT capsule Take 8,000 Units by mouth 2 (two) times daily.    . vitamin C (ASCORBIC ACID) 500 MG tablet Take 500 mg by mouth every morning.    Marland Kitchen ZETIA 10 MG tablet Take 10 mg by mouth at bedtime.     Marland Kitchen zinc sulfate 220 (50 Zn) MG capsule Take 1 capsule (220 mg total) by mouth every morning. 30 capsule 0  . acetaminophen (TYLENOL) 325 MG tablet Take 650 mg by mouth every 4 (four) hours as needed for mild pain.    Marland Kitchen alendronate (FOSAMAX) 70 MG tablet Take 70 mg by mouth once a week.     . calcium carbonate (OS-CAL - DOSED IN MG OF ELEMENTAL CALCIUM) 1250 (500 Ca) MG tablet Take 1 tablet (1,250 mg total) by mouth 2 (two) times daily with a meal. (Patient not taking: Reported on 09/06/2017) 60 tablet 0  . cyanocobalamin 500 MCG tablet Take 500 mcg by mouth at  bedtime.     No current facility-administered medications for this visit.     Allergies:   Demerol [meperidine] and Keppra [levetiracetam]    Social History:  The patient  reports that he has quit smoking. He has never used smokeless tobacco. He reports that he drinks alcohol. He reports that he does not use drugs.   Family History:  The patient's family history includes Cancer in his mother; Heart disease in his father; Hypertension in his mother; Kidney failure in his father.    ROS:  Please see the history of present illness. All other systems are reviewed and negative.    PHYSICAL EXAM: VS:  BP 132/64 (BP Location: Right Arm, Patient Position: Sitting, Cuff Size: Normal)   Pulse 98   Ht 5\' 9"  (1.753 m)   Wt 169 lb (76.7 kg)   SpO2 97% Comment: room air  BMI 24.96 kg/m  , BMI Body mass index is 24.96 kg/m. GEN: Well nourished, well developed, male in no acute distress  HEENT: normal for age  Neck: Minimal JVD, no carotid bruit, no masses Cardiac: Irregular rate and rhythm; no murmur, no rubs, or gallops Respiratory: Decreased breath sounds bases but no rales bilaterally, normal work of breathing GI: soft, nontender, nondistended, + BS MS: no deformity or atrophy; 1+ edema, R>L chronically; distal pulses are 2+ in upper extremities, not able to palpate in lower extremities due to socks and bandages Skin: warm and dry, no rash Neuro:  Strength and sensation are intact Psych: euthymic mood, full affect   EKG:  EKG is not ordered today.  ECHO:04/18/2017 - Left ventricle: The cavity size was normal. Wall thickness was increased in a pattern of moderate LVH. Systolic function was normal. The estimated ejection fraction was in the range of 60% to 65%. Wall motion was normal; there were no regional wall motion  abnormalities. - Mitral valve: There was mild regurgitation. - Left atrium: The atrium was moderately to severely dilated. - Right atrium: The atrium was  moderately dilated. - Pulmonary arteries: Systolic pressure was mildly to moderately increased. PA peak pressure: 37 mm Hg (S).  Recent Labs: 07/11/2017: B Natriuretic Peptide 309.9; Magnesium 1.7 07/12/2017: TSH 0.452 07/29/2017: ALT 28; BUN 41; Creatinine, Ser 1.31; Hemoglobin 9.9; Platelets 160; Potassium 4.8; Sodium 144    Lipid Panel    Component Value Date/Time   CHOL 82 07/12/2017 0624   TRIG 45 07/12/2017 0624   HDL 46 07/12/2017 0624   CHOLHDL 1.8 07/12/2017 0624   VLDL 9 07/12/2017 0624   LDLCALC 27 07/12/2017 0624     Wt Readings from Last 3 Encounters:  09/06/17 169 lb (76.7 kg)  07/19/17 155 lb 9.6 oz (70.6 kg)  07/05/17 183 lb 13.8 oz (83.4 kg)     Other studies Reviewed: Additional studies/ records that were reviewed today include: Office notes, hospital records and testing.  ASSESSMENT AND PLAN:  1.  Chronic diastolic CHF: Continue trying to stand on the scales, as his body gets stronger, he may be able to do this, using a walker. - Continue the Lasix and potassium every other day for now - Continue to pay attention to leg diameter, if it takes a big jump or if she is able to see his neck veins clearly, he may need more Lasix or reassessment. - She is being vigilant about the amount of sodium in the foods that they eat and is aware of his fluid intake.  She is very sure he is getting more than 1 but less than 2 L of all liquids daily. - Check a complete metabolic profile today, to assess his renal function, electrolytes and liver functions on his multiple medications.  2.  Chronic atrial fibrillation: Rate is controlled today and he is not having any symptoms. - Continue Cardizem and metoprolol  3.  Chronic anticoagulation: It is not all clear that he got Eliquis during his stay at [facility] - However, he has not had any stroke or TIA symptoms. - He is back on his Eliquis with no bleeding issues  4.  Dyslipidemia: His LDL in May was 19.   - [name]  advised that he could cut back on the Zetia to 1/2 tablet a day.  Agree -Continue Zetia 5 mg daily and Pravachol 40 mg daily - Recheck labs in August or September  5.  Chronic kidney disease, stage III - Watch for dehydration, follow labs  6.  Hypertension: -Blood pressure is controlled on current medications, continue Cardizem 300 mg, Lasix 40 mg every other day, and metoprolol 25 mg twice daily  7.  Nutritional supplements: - I advised that I was not aware of any good data that support the use of coenzyme Q 10, if it does not make him feel better, okay to stop it - I advised that vitamin C 500 mg he could get easily and orange juice or with the multivitamin which he already takes.  If he does not think it makes him feel better, okay to stop it - Other supplements such as vitamin D calcium and zinc may be treating deficiencies, discuss all other vitamins and supplements with [name].   Current medicines are reviewed at length with the patient today.  The patient has concerns regarding medicines.  The following changes have been made:  no change  Labs/ tests ordered today include:   Orders  Placed This Encounter  Procedures  . Comprehensive metabolic panel     Disposition:   FU with Dr. Sallyanne Kuster  Signed, Rosaria Ferries, PA-C  09/06/2017 11:16 AM    [place] Phone: [phone]; Fax: [phone]  This note was written with the assistance of speech recognition software. Please excuse any transcriptional errors.

## 2017-09-06 NOTE — Progress Notes (Signed)
Thanks MCr 

## 2017-09-11 ENCOUNTER — Telehealth: Payer: Self-pay | Admitting: Physician Assistant

## 2017-09-11 NOTE — Telephone Encounter (Signed)
New message ° ° °Please call with lab results °

## 2017-09-11 NOTE — Telephone Encounter (Signed)
Spoke with pt's wife, DPR on file, and made her aware that we are waiting on Rhonda to give a final interpretation of labs.  Advised crea did improve a little and sodium and K+ are normal.  Advised someone will call with more information once Suanne Marker reviews.  Wife appreciative for call.

## 2017-09-14 ENCOUNTER — Encounter (HOSPITAL_COMMUNITY): Payer: Self-pay

## 2017-09-14 ENCOUNTER — Emergency Department (HOSPITAL_COMMUNITY)
Admission: EM | Admit: 2017-09-14 | Discharge: 2017-09-14 | Disposition: A | Discharge status: Home / Self Care | Payer: Medicare Other | Attending: Emergency Medicine | Admitting: Emergency Medicine

## 2017-09-14 ENCOUNTER — Other Ambulatory Visit: Payer: Self-pay

## 2017-09-14 ENCOUNTER — Emergency Department (HOSPITAL_COMMUNITY): Payer: Medicare Other

## 2017-09-14 DIAGNOSIS — I129 Hypertensive chronic kidney disease with stage 1 through stage 4 chronic kidney disease, or unspecified chronic kidney disease: Secondary | ICD-10-CM | POA: Diagnosis not present

## 2017-09-14 DIAGNOSIS — I2510 Atherosclerotic heart disease of native coronary artery without angina pectoris: Secondary | ICD-10-CM | POA: Diagnosis not present

## 2017-09-14 DIAGNOSIS — R109 Unspecified abdominal pain: Secondary | ICD-10-CM | POA: Diagnosis not present

## 2017-09-14 DIAGNOSIS — N3090 Cystitis, unspecified without hematuria: Secondary | ICD-10-CM | POA: Diagnosis not present

## 2017-09-14 DIAGNOSIS — Z87891 Personal history of nicotine dependence: Secondary | ICD-10-CM | POA: Insufficient documentation

## 2017-09-14 DIAGNOSIS — Z7901 Long term (current) use of anticoagulants: Secondary | ICD-10-CM | POA: Diagnosis not present

## 2017-09-14 DIAGNOSIS — Z85828 Personal history of other malignant neoplasm of skin: Secondary | ICD-10-CM | POA: Insufficient documentation

## 2017-09-14 DIAGNOSIS — N183 Chronic kidney disease, stage 3 (moderate): Secondary | ICD-10-CM | POA: Insufficient documentation

## 2017-09-14 DIAGNOSIS — E039 Hypothyroidism, unspecified: Secondary | ICD-10-CM | POA: Diagnosis not present

## 2017-09-14 DIAGNOSIS — R319 Hematuria, unspecified: Secondary | ICD-10-CM | POA: Insufficient documentation

## 2017-09-14 LAB — URINALYSIS, ROUTINE W REFLEX MICROSCOPIC
Bilirubin Urine: NEGATIVE
Glucose, UA: NEGATIVE mg/dL
Ketones, ur: NEGATIVE mg/dL
Nitrite: NEGATIVE
Protein, ur: 30 mg/dL — AB
Specific Gravity, Urine: 1.02 (ref 1.005–1.030)
pH: 5.5 (ref 5.0–8.0)

## 2017-09-14 LAB — CBC
HCT: 34.5 % — ABNORMAL LOW (ref 39.0–52.0)
Hemoglobin: 11 g/dL — ABNORMAL LOW (ref 13.0–17.0)
MCH: 31.2 pg (ref 26.0–34.0)
MCHC: 31.9 g/dL (ref 30.0–36.0)
MCV: 97.7 fL (ref 78.0–100.0)
Platelets: 113 10*3/uL — ABNORMAL LOW (ref 150–400)
RBC: 3.53 MIL/uL — ABNORMAL LOW (ref 4.22–5.81)
RDW: 16.5 % — ABNORMAL HIGH (ref 11.5–15.5)
WBC: 8.8 10*3/uL (ref 4.0–10.5)

## 2017-09-14 LAB — BASIC METABOLIC PANEL
Anion gap: 9 (ref 5–15)
BUN: 41 mg/dL — ABNORMAL HIGH (ref 8–23)
CO2: 28 mmol/L (ref 22–32)
Calcium: 8.2 mg/dL — ABNORMAL LOW (ref 8.9–10.3)
Chloride: 107 mmol/L (ref 98–111)
Creatinine, Ser: 1.26 mg/dL — ABNORMAL HIGH (ref 0.61–1.24)
GFR calc Af Amer: 60 mL/min — ABNORMAL LOW (ref >60)
GFR calc non Af Amer: 52 mL/min — ABNORMAL LOW (ref >60)
Glucose, Bld: 127 mg/dL — ABNORMAL HIGH (ref 70–99)
Potassium: 3.9 mmol/L (ref 3.5–5.1)
Sodium: 144 mmol/L (ref 135–145)

## 2017-09-14 LAB — URINALYSIS, MICROSCOPIC (REFLEX)

## 2017-09-14 MED ORDER — SODIUM CHLORIDE 0.9 % IV SOLN
INTRAVENOUS | Status: DC
  Administered 2017-09-14: 12:00:00 via INTRAVENOUS

## 2017-09-14 MED ORDER — SODIUM CHLORIDE 0.9 % IV BOLUS
1000.0000 mL | Freq: Once | INTRAVENOUS | Status: AC
  Administered 2017-09-14: 1000 mL via INTRAVENOUS

## 2017-09-14 MED ORDER — SODIUM CHLORIDE 0.9 % IV SOLN
1.0000 g | Freq: Once | INTRAVENOUS | Status: AC
  Administered 2017-09-14: 1 g via INTRAVENOUS
  Filled 2017-09-14: qty 10

## 2017-09-14 MED ORDER — CEPHALEXIN 500 MG PO CAPS: 500.0000 mg | ORAL_CAPSULE | Freq: Four times a day (QID) | ORAL | 0 refills | Status: DC

## 2017-09-14 NOTE — ED Notes (Signed)
Bed: WA17 Expected date:  Expected time:  Means of arrival:  Comments: EMS-hematuria 

## 2017-09-14 NOTE — ED Notes (Signed)
Patient transported to CT 

## 2017-09-14 NOTE — Discharge Instructions (Signed)
Hold Eliquis today, tomorrow, and Monday.  Do not restart if you continue to see blood.  If you do still see blood, speak with your doctor.  Return if you have any trouble urinating.

## 2017-09-14 NOTE — ED Triage Notes (Signed)
Per EMS: Pt from UC for hematuria with flank pain.  Pt reports urine has been darker and more frequent the last few days.  Pt was started on Eliquis a few months ago for A fib.  Pt reports bloody urine x1 day.  No pain on urination.  Pt has difficulty ambulating at baseline.

## 2017-09-14 NOTE — ED Provider Notes (Signed)
[facility] DEPT Provider Note   CSN: [id number] Arrival date & time: 09/14/17  1048     History   Chief Complaint Chief Complaint  Patient presents with  . Hematuria  . Flank Pain    HPI [name] is a 84 y.o. male.  Pt presents to the ED today from UC with hematuria.  Pt was put on Eliquis in feb for a new dx afib.  The pt developed some pain and some blood in urine this morning.  He initially went to UC who told him to come here.  He did have some right sided flank pain, but does not have any now.     Past Medical History:  Diagnosis Date  . Abnormality of gait 09/22/2015  . Arthritis   . Atrial fibrillation ([place])   . CAD (coronary artery disease)    Stent to RCA, Penta stent, 99% reduced to 0% 2002.  . Cancer ([place])    skin CA removed from back  . Chronic insomnia 03/30/2015  . Complication of anesthesia    trouble waking up  . Constipation    from medications taken  . GERD (gastroesophageal reflux disease)   . Hypercholesteremia   . Hypertension   . Hypothyroidism   . Memory difficulty 09/22/2015  . Osteoarthritis   . Seizures ([place])   . Sepsis ([place]) 05/2017  . Transient alteration of awareness 03/30/2015  . Vertigo    hx of    Patient Active Problem List   Diagnosis Date Noted  . Acute hypoxemic respiratory failure ([place]) 07/17/2017  . Aspiration syndrome, subsequent encounter 07/11/2017  . Aspiration pneumonia ([place]) 07/01/2017  . Acute encephalopathy 07/01/2017  . Thrombocytopenia ([place]) 07/01/2017  . Acute on chronic diastolic heart failure ([place]) 07/01/2017  . Anemia of chronic disease 07/01/2017  . Troponin level elevated 07/01/2017  . PAF (paroxysmal atrial fibrillation) ([place]) 07/01/2017  . Goals of care, counseling/discussion   . Palliative care by specialist   . Acute metabolic encephalopathy 28/31/5176  . CKD (chronic kidney disease) stage 3, GFR 30-59 ml/min (HCC)   . CAP (community acquired pneumonia)  04/17/2017  . Atrial fibrillation with RVR ([place]) 04/17/2017  . Hallux rigidus, right foot 03/10/2016  . Lumbosacral spondylosis without myelopathy 10/29/2015  . Memory difficulty 09/22/2015  . Abnormality of gait 09/22/2015  . Hyperglycemia 07/06/2015  . Chronic pain 07/06/2015  . Chronic insomnia 03/30/2015  . Transient alteration of awareness 03/30/2015  . Abnormal liver function   . Altered mental status 01/31/2015  . Essential hypertension 01/31/2015  . Constipation 01/31/2015  . Hypothyroidism 01/31/2015  . Seizure disorder ([place]) 01/31/2015  . Bladder outlet obstruction 01/31/2015  . GERD (gastroesophageal reflux disease) 01/31/2015  . Chronic back pain 01/31/2015  . Acute kidney injury ([place]) 01/31/2015    Past Surgical History:  Procedure Laterality Date  . BACK SURGERY    . EYE SURGERY     Bilateral Cataract surgery   . HERNIA REPAIR    . I&D EXTREMITY Right 05/10/2015   Procedure: IRRIGATION AND DEBRIDEMENT EXTREMITY;  Surgeon: Leanora Cover, MD;  Location: [facility];  Service: Orthopedics;  Laterality: Right;  . KNEE ARTHROPLASTY     right knee X 2; left knee once  . LAMINECTOMY     X 6  . POSTERIOR CERVICAL FUSION/FORAMINOTOMY  01/28/2012   Procedure: POSTERIOR CERVICAL FUSION/FORAMINOTOMY LEVEL 3;  Surgeon: [name], MD;  Location: [facility];  Service: Neurosurgery;  Laterality: Left;  Posterior Cervical  Five-Thoracic One Fusion, Arthrodesis with LEFT Cervical Seven-thoracic One Laminectomy, Foraminotomy and Resection of Synovial Cyst  . POSTERIOR CERVICAL FUSION/FORAMINOTOMY N/A 01/29/2013   Procedure: POSTERIOR CERVICAL FUSION/FORAMINOTOMY LEVEL 1 and C2-5 Posteriolateral Arthrodesis;  Surgeon: [name], MD;  Location: [facility];  Service: Neurosurgery;  Laterality: N/A;  C2-C3 Laminectomy C2-C3 posterior cervical arthrodesis  . TONSILLECTOMY          Home Medications    Prior to Admission medications   Medication Sig  Start Date End Date Taking? Authorizing Provider  acetaminophen (TYLENOL) 325 MG tablet Take 650 mg by mouth every 4 (four) hours as needed for mild pain.   Yes [provider]  alendronate (FOSAMAX) 70 MG tablet Take 70 mg by mouth once a week.  01/06/12  Yes [provider]  apixaban (ELIQUIS) 5 MG TABS tablet Take 1 tablet (5 mg total) by mouth 2 (two) times daily. 09/04/17  Yes Barrett, Evelene Croon, PA-C  calcium carbonate (OS-CAL) 600 MG tablet Take 600 mg by mouth daily after breakfast.    Yes [provider]  cholecalciferol (VITAMIN D) 1000 units tablet Take 1,000 Units by mouth 2 (two) times daily.   Yes [provider]  Coenzyme Q10 (COQ10) 200 MG CAPS Take 200 mg by mouth at bedtime.   Yes [provider]  collagenase (SANTYL) ointment Apply topically daily. 06/02/17  Yes Mikhail, Maryann, DO  cyanocobalamin 500 MCG tablet Take 500 mcg by mouth at bedtime.   Yes [provider]  diltiazem (CARDIZEM CD) 300 MG 24 hr capsule Take 1 capsule (300 mg total) by mouth daily. 06/28/17  Yes Skeet Latch, MD  divalproex (DEPAKOTE) 250 MG DR tablet Take 3 tablets (750 mg total) by mouth every 12 (twelve) hours. Patient taking differently: Take 250-750 mg by mouth See admin instructions. Take 1 tablet in the morning, and 3 tablets in the evening. 07/09/15  Yes Robbie Lis, MD  docusate sodium (COLACE) 100 MG capsule Take 200 mg by mouth 2 (two) times daily.    Yes [provider]  doxazosin (CARDURA) 4 MG tablet Take 1 tablet (4 mg total) by mouth daily. 06/28/17  Yes Skeet Latch, MD  feeding supplement, ENSURE ENLIVE, (ENSURE ENLIVE) LIQD Take 237 mLs by mouth 3 (three) times daily between meals. Patient taking differently: Take 237 mLs by mouth daily as needed (nutrition).  06/01/17  Yes Mikhail, Velta Addison, DO  ferrous sulfate 325 (65 FE) MG tablet Take 1 tablet (325 mg total) by mouth 2 (two) times daily with a meal. 06/28/17  Yes  Skeet Latch, MD  furosemide (LASIX) 40 MG tablet Weight daily. If weight < 160 lb, do not give diuretic. If weight 160-170 lb, furosemide 40 mg daily. If weight >170 lb, furosemide 80 mg daily. Patient taking differently: Take 40 mg by mouth every other day.  07/19/17  Yes [name], DO  gabapentin (NEURONTIN) 300 MG capsule Take 300 mg by mouth 2 (two) times daily.   Yes [provider]  HYDROcodone-acetaminophen (NORCO) 10-325 MG tablet Take 1 tablet by mouth every 6 (six) hours as needed for moderate pain. 07/19/17  Yes [name], DO  levothyroxine (SYNTHROID, LEVOTHROID) 150 MCG tablet Take 150 mcg by mouth daily before breakfast. 12/27/11  Yes [provider]  lidocaine (LIDODERM) 5 % Place 1 patch onto the skin daily as needed. Remove & Discard patch within 12 hours or as directed by MD Patient taking differently: Place 1 patch onto the skin daily  as needed (pain). Remove & Discard patch within 12 hours or as directed by MD  06/01/17  Yes Mikhail, Velta Addison, DO  metoprolol tartrate (LOPRESSOR) 25 MG tablet Take 1 tablet (25 mg total) by mouth 2 (two) times daily. 06/28/17  Yes Skeet Latch, MD  Multiple Vitamins-Minerals (CERTAVITE/ANTIOXIDANTS) TABS Take 1 tablet by mouth every evening.    Yes [provider]  nitroGLYCERIN (NITROSTAT) 0.4 MG SL tablet Place 0.4 mg under the tongue every 5 (five) minutes as needed for chest pain. Reported on 07/20/2015   Yes [provider]  omeprazole (PRILOSEC) 20 MG capsule Take 20 mg by mouth daily before breakfast.  12/23/11  Yes [provider]  Potassium Chloride ER 20 MEQ TBCR Take only when taking lasix. Take 31mEq when taking Lasix 40mg . Take 56mEq when taking Lasix 80mg . Lasix dosing will be based on your daily weight. Patient taking differently: Take 20 mEq by mouth every other day.  07/19/17  Yes [name], DO  pravastatin (PRAVACHOL) 40 MG tablet Take 40 mg by mouth at bedtime.  10/29/14   Yes [provider]  predniSONE (DELTASONE) 10 MG tablet Take 1 tablet (10 mg total) by mouth daily with breakfast. 04/28/17  Yes Lavina Hamman, MD  senna (SENOKOT) 8.6 MG tablet Take 1 tablet by mouth 2 (two) times daily as needed for constipation.    Yes [provider]  vitamin A 8000 UNIT capsule Take 8,000 Units by mouth 2 (two) times daily.   Yes [provider]  vitamin C (ASCORBIC ACID) 500 MG tablet Take 500 mg by mouth every morning.   Yes [provider]  ZETIA 10 MG tablet Take 5 mg by mouth at bedtime.  10/15/11  Yes [provider]  zinc sulfate 220 (50 Zn) MG capsule Take 1 capsule (220 mg total) by mouth every morning. 06/02/17  Yes Mikhail, Clinical biochemist, DO  Amino Acids-Protein Hydrolys (FEEDING SUPPLEMENT, PRO-STAT SUGAR FREE 64,) LIQD Take 30 mLs by mouth 2 (two) times daily. Patient not taking: Reported on 09/14/2017 07/05/17   Lavina Hamman, MD  calcium carbonate (OS-CAL - DOSED IN MG OF ELEMENTAL CALCIUM) 1250 (500 Ca) MG tablet Take 1 tablet (1,250 mg total) by mouth 2 (two) times daily with a meal. Patient not taking: Reported on 09/06/2017 06/01/17   Cristal Ford, DO  cephALEXin (KEFLEX) 500 MG capsule Take 1 capsule (500 mg total) by mouth 4 (four) times daily. 09/14/17   Isla Pence, MD    Family History Family History  Problem Relation Age of Onset  . Hypertension Mother   . Cancer Mother   . Kidney failure Father   . Heart disease Father     Social History Social History   Tobacco Use  . Smoking status: Former Research scientist (life sciences)  . Smokeless tobacco: Never Used  Substance Use Topics  . Alcohol use: Yes    Comment: rare  . Drug use: No     Allergies   Demerol [meperidine] and Keppra [levetiracetam]   Review of Systems Review of Systems  Genitourinary: Positive for hematuria.  All other systems reviewed and are negative.    Physical Exam Updated Vital Signs BP 115/60 (BP Location: Right Arm)   Pulse (!) 57    Temp (!) 97.3 F (36.3 C) (Oral)   Resp 16   Ht 5\' 9"  (1.753 m)   Wt 76.7 kg (169 lb)   SpO2 100%   BMI 24.96 kg/m   Physical Exam  Constitutional: He is oriented  to person, place, and time. He appears well-developed and well-nourished.  HENT:  Head: Normocephalic and atraumatic.  Right Ear: External ear normal.  Left Ear: External ear normal.  Nose: Nose normal.  Mouth/Throat: Oropharynx is clear and moist.  Eyes: Pupils are equal, round, and reactive to light. Conjunctivae and EOM are normal.  Neck: Normal range of motion. Neck supple.  Cardiovascular: Normal rate, regular rhythm, normal heart sounds and intact distal pulses.  Pulmonary/Chest: Effort normal and breath sounds normal.  Abdominal: Soft. Bowel sounds are normal.  Musculoskeletal: Normal range of motion. He exhibits edema.  Neurological: He is alert and oriented to person, place, and time.  Weakness to bilateral le (chronic-pt is mainly wheelchair bound)  Skin: Skin is warm. Capillary refill takes less than 2 seconds.  Psychiatric: He has a normal mood and affect. His behavior is normal. Judgment and thought content normal.  Nursing note and vitals reviewed.    ED Treatments / Results  Labs (all labs ordered are listed, but only abnormal results are displayed) Labs Reviewed  BASIC METABOLIC PANEL - Abnormal; Notable for the following components:      Result Value   Glucose, Bld 127 (*)    BUN 41 (*)    Creatinine, Ser 1.26 (*)    Calcium 8.2 (*)    GFR calc non Af Amer 52 (*)    GFR calc Af Amer 60 (*)    All other components within normal limits  CBC - Abnormal; Notable for the following components:   RBC 3.53 (*)    Hemoglobin 11.0 (*)    HCT 34.5 (*)    RDW 16.5 (*)    Platelets 113 (*)    All other components within normal limits  URINALYSIS, ROUTINE W REFLEX MICROSCOPIC - Abnormal; Notable for the following components:   APPearance HAZY (*)    Hgb urine dipstick LARGE (*)    Protein, ur 30 (*)     Leukocytes, UA SMALL (*)    All other components within normal limits  URINALYSIS, MICROSCOPIC (REFLEX) - Abnormal; Notable for the following components:   Bacteria, UA MANY (*)    All other components within normal limits  URINE CULTURE    EKG None  Radiology Ct Renal Stone Study  Result Date: 09/14/2017 CLINICAL DATA:  Hematuria with flank pain. EXAM: CT ABDOMEN AND PELVIS WITHOUT CONTRAST TECHNIQUE: Multidetector CT imaging of the abdomen and pelvis was performed following the standard protocol without IV contrast. COMPARISON:  CT abdomen dated 07/06/2015. FINDINGS: Lower chest: Mild chronic atelectasis/scarring at each lung base. Hepatobiliary: Small layering gallstones within the otherwise normal-appearing gallbladder. No bile duct dilatation. No focal liver abnormality is seen. Pancreas: Partially infiltrated with fat but otherwise unremarkable. Spleen: Normal in size without focal abnormality. Adrenals/Urinary Tract: Multiple bilateral renal cysts, some simple and some hemorrhagic, largest cysts within the LEFT kidney measuring 7.8 cm. No suspicious mass, stone or hydronephrosis bilaterally. No ureteral or bladder calculi identified. Bladder walls are circumferentially thickened, possibly accentuated by the partial bladder decompression. Stomach/Bowel: No dilated large or small bowel loops. Extensive diverticulosis of the descending and sigmoid colon but no focal inflammatory change to suggest acute diverticulitis. No bowel wall thickening or evidence of bowel wall inflammation seen. Moderate amount of stool throughout the nondistended colon. Stomach is unremarkable. Appendix is normal. Vascular/Lymphatic: Aortic atherosclerosis. No enlarged abdominal or pelvic lymph nodes. Reproductive: Prostate is unremarkable. Other: Trace free fluid in the RIGHT lower pelvis. No other free fluid. No abscess collection. No  free intraperitoneal air. Musculoskeletal: Advanced degenerative change throughout  the scoliotic lumbar spine and lower thoracic spine, with associated chronic wedge compression deformity of the L1 vertebral body. There is surgical changes of central canal decompression throughout the lumbar spine. Old rib fractures noted bilaterally. No acute or suspicious osseous finding. Superficial soft tissues are unremarkable. IMPRESSION: 1. Bladder walls are mildly thickened circumferentially, likely accentuated by the partial bladder decompression, suspicious for cystitis. Trace free fluid within the RIGHT lower pelvis, at the level of the bladder, increases suspicion of cystitis. Recommend correlation with urinalysis. 2. No CT evidence of pyelonephritis. No renal stone or hydronephrosis. No ureteral or bladder calculi. Bilateral renal cysts. 3. Colonic diverticulosis without evidence of acute diverticulitis. 4. Cholelithiasis without evidence of acute cholecystitis. 5. Fairly large amount of stool throughout the nondistended colon (constipation?). 6. Advanced degenerative changes throughout the scoliotic lumbar spine and lower thoracic spine. Electronically Signed   By: Franki Cabot M.D.   On: 09/14/2017 11:38    Procedures Procedures (including critical care time)  Medications Ordered in ED Medications  sodium chloride 0.9 % bolus 1,000 mL (0 mLs Intravenous Stopped 09/14/17 1203)    And  0.9 %  sodium chloride infusion ( Intravenous New Bag/Given 09/14/17 1202)  cefTRIAXone (ROCEPHIN) 1 g in sodium chloride 0.9 % 100 mL IVPB (1 g Intravenous New Bag/Given 09/14/17 1450)     Initial Impression / Assessment and Plan / ED Course  I have reviewed the triage vital signs and the nursing notes.  Pertinent labs & imaging results that were available during my care of the patient were reviewed by me and considered in my medical decision making (see chart for details).    Pt is feeling much better.  He does have cystitis, so will be given rocephin for infection here in the ED and d/c home on  keflex.  The pt is told to hold Eliquis for 3 days.  Amb referral given for urology.  Pt instructed to return if he has any difficulty urinating.  Return if worse.  Final Clinical Impressions(s) / ED Diagnoses   Final diagnoses:  Hematuria, unspecified type  Cystitis  On apixaban therapy    ED Discharge Orders        Ordered    Ambulatory referral to Urology     09/14/17 1512    cephALEXin (KEFLEX) 500 MG capsule  4 times daily     09/14/17 1514       Isla Pence, MD 09/14/17 1517

## 2017-09-16 ENCOUNTER — Other Ambulatory Visit: Payer: Self-pay

## 2017-09-16 ENCOUNTER — Telehealth: Payer: Self-pay

## 2017-09-16 LAB — URINE CULTURE: Culture: >=100000 — AB

## 2017-09-16 NOTE — Telephone Encounter (Signed)
-----   Message from Lonn Georgia, PA-C sent at 09/11/2017  5:57 PM EDT ----- Please let him know his BMET is fine, Creatinine is improved, electrolytes ok Needs more protein in his diet. Thanks

## 2017-09-16 NOTE — Telephone Encounter (Signed)
Spoke with patients spuse and she wanted to know if they need to make any medication changes with the potassium and lasix; since the lab work was better.  Please advise!

## 2017-09-16 NOTE — Patient Outreach (Signed)
[name]) Care Management  09/16/2017  [name] Nov 26, 1933 [id number]  Referral received 6/18 per Robert Opitz, LCSW: [name] discharged from skilled facility-[facility] on 6/15.  84 year old with history of HTN, Heart failure, CKD, seizure disorder, CKD, Paroxysmal atrial fibrillation, memory loss, hypothyroid, OSA  RNCM called and spoke with [name]'s wife. [name] reports [name] went to the emergency room due to hematuria, and was treated for urinary tract infection. She states that [name] has his antibiotic and is doing much better.  [name] and [name] expressed the desire for a [facility] pharmacy referral for medication reconciliation since [name] is on so many medications and to ensure that all [name]'s providers are aware of what he is actually taking.   Plan: [facility] referral,  follow up telephonically next week.  Robert Silversmith, RN, MSN, [place] Coordinator Cell: [phone]

## 2017-09-16 NOTE — Telephone Encounter (Signed)
No, do not change the Lasix and potassium as long as he is doing well. I know she is vigilant, if she feels he is getting too dry or putting on fluids, I know she will call. Thanks

## 2017-09-17 ENCOUNTER — Telehealth: Payer: Self-pay | Admitting: Pharmacist

## 2017-09-17 ENCOUNTER — Telehealth (HOSPITAL_COMMUNITY): Payer: Self-pay | Admitting: Pharmacist

## 2017-09-17 ENCOUNTER — Encounter (HOSPITAL_BASED_OUTPATIENT_CLINIC_OR_DEPARTMENT_OTHER): Payer: Medicare Other | Attending: Internal Medicine

## 2017-09-17 ENCOUNTER — Encounter (HOSPITAL_BASED_OUTPATIENT_CLINIC_OR_DEPARTMENT_OTHER): Payer: Self-pay

## 2017-09-17 ENCOUNTER — Telehealth: Payer: Self-pay | Admitting: Emergency Medicine

## 2017-09-17 DIAGNOSIS — L97812 Non-pressure chronic ulcer of other part of right lower leg with fat layer exposed: Secondary | ICD-10-CM | POA: Diagnosis not present

## 2017-09-17 DIAGNOSIS — L950 Livedoid vasculitis: Secondary | ICD-10-CM | POA: Insufficient documentation

## 2017-09-17 DIAGNOSIS — L97822 Non-pressure chronic ulcer of other part of left lower leg with fat layer exposed: Secondary | ICD-10-CM | POA: Insufficient documentation

## 2017-09-17 DIAGNOSIS — I2510 Atherosclerotic heart disease of native coronary artery without angina pectoris: Secondary | ICD-10-CM | POA: Diagnosis not present

## 2017-09-17 DIAGNOSIS — G4730 Sleep apnea, unspecified: Secondary | ICD-10-CM | POA: Insufficient documentation

## 2017-09-17 DIAGNOSIS — G6182 Multifocal motor neuropathy: Secondary | ICD-10-CM | POA: Insufficient documentation

## 2017-09-17 DIAGNOSIS — I10 Essential (primary) hypertension: Secondary | ICD-10-CM | POA: Diagnosis not present

## 2017-09-17 DIAGNOSIS — I890 Lymphedema, not elsewhere classified: Secondary | ICD-10-CM | POA: Diagnosis not present

## 2017-09-17 DIAGNOSIS — Z7952 Long term (current) use of systemic steroids: Secondary | ICD-10-CM | POA: Insufficient documentation

## 2017-09-17 NOTE — Telephone Encounter (Signed)
Post ED Visit - Positive Culture Follow-up: Successful Patient Follow-Up  Culture assessed and recommendations reviewed by:  []  Elenor Quinones, Pharm.D. []  Heide Guile, Pharm.D., BCPS AQ-ID []  Parks Neptune, Pharm.D., BCPS []  Alycia Rossetti, Pharm.D., BCPS []  [place], [place].D., BCPS, AAHIVP []  Legrand Como, Pharm.D., BCPS, AAHIVP []  Salome Arnt, PharmD, BCPS []  Johnnette Gourd, PharmD, BCPS [x]  Hughes Better, PharmD, BCPS []  Leeroy Cha, PharmD  Positive urine culture  []  Patient discharged without antimicrobial prescription and treatment is now indicated [x]  Organism is resistant to prescribed ED discharge antimicrobial []  Patient with positive blood cultures  Changes discussed with ED provider: Kennith Maes PA New antibiotic prescription stop keflex, start macrobid 100mg  po bid x 5 days Called to [employer] and [place] patient, 09/17/2017 1230   Hazle Nordmann 09/17/2017, 12:28 PM

## 2017-09-17 NOTE — Progress Notes (Signed)
ED Antimicrobial Stewardship Positive Culture Follow Up   [name] is an 84 y.o. male who presented to [facility] on (Not on file) with a chief complaint of No chief complaint on file.   [x]  Treated with Keflex, organism resistant to prescribed antimicrobial []  Patient discharged originally without antimicrobial agent and treatment is now indicated  Stop Keflex  New antibiotic prescription: Macrobid 100 mg po bid x 5 days  ED Provider: S. Petrucelli, PA-C   Harvel Quale 09/17/2017, 9:35 AM Clinical Pharmacist Monday - Friday phone -  [phone] Saturday - Sunday phone - [phone]

## 2017-09-17 NOTE — Patient Outreach (Signed)
[facility]) Care Management  09/17/2017  [name] [birth year] [id number]   Patient was called regarding medication management and adherence. Unfortunately, he did not answer the numbers listed for his home phone (rang >30 with no voicemail) or the number listed as his mobile number (voicemail box was full).  There is some confusion with Eliquis.  According to fill data, Eliquis was last filled 06/28/17.  Plan: Patient will be sent an unsuccessful contact letter. Call patient back in 3-5 business days.   Elayne Guerin, PharmD, [place] Clinical Pharmacist [phone]

## 2017-09-17 NOTE — Telephone Encounter (Signed)
Attempted to call patient to discuss [name] recommendations but was unable to leave a message.

## 2017-09-18 NOTE — Telephone Encounter (Signed)
D/W wife [name]  She states that pt is taking lasix and potassium ever-other-day. She will continue this and CB if she needs to change

## 2017-09-20 ENCOUNTER — Other Ambulatory Visit: Payer: Self-pay

## 2017-09-20 ENCOUNTER — Other Ambulatory Visit: Payer: Self-pay | Admitting: Pharmacist

## 2017-09-20 NOTE — Patient Outreach (Signed)
[facility]) Care Management  09/20/2017  NOLEN LINDAMOOD 1933/07/05 [id number]   RNCM returned call to Mrs. Dicioccio. Ms Miley expressed interest in completing advanced directives. She was also following up on pharmacy referral.   RNCM provided pharmacist contact number and updated [facility] pharmacist.  Plan: continue to follow. Social work referral.  Thea Silversmith, RN, MSN, [place] Coordinator Cell: [phone]

## 2017-09-23 ENCOUNTER — Other Ambulatory Visit: Payer: Self-pay | Admitting: Pharmacist

## 2017-09-23 ENCOUNTER — Encounter: Payer: Self-pay | Admitting: Pharmacist

## 2017-09-23 NOTE — Patient Outreach (Signed)
[facility]) Care Management  09/23/2017  [name] Mar 06, 1934 [id number]   Called patient per referral for medication review. Spoke with the patient's wife. HIPAA identifiers were obtained.  After explaining who I was and attempting to do a medication review, the patient's wife requested that I come to their home for a home visit.  Plan: Conduct a home visit on Monday September 24, 2017.   Elayne Guerin, PharmD, [place] Clinical Pharmacist [phone]

## 2017-09-23 NOTE — Patient Outreach (Signed)
[facility]) Care Management  09/23/2017  [name] 05/01/1933 [id number]  Home visit completed today at patient's home.  His wife and [facility] Pharmacist, Denyse Amass were present for the visit. HIPAA identifiers were obtained.  Patient is an 84 year old male with multiple medical conditions including but not limited to: HTN, seizure disorder, CKD stage III, A Fib, chronic back pain, hypothyroidism, and GERD.  Subjective: Comprehensive medication review was performed.  Wife is very competent and aware of all medications.  Patient obtains medications from [employer] order, [facility] local, & [employer] per patient preference.  Medications were all up to date and very well organized per caregiver. Of note, patient is still using up the remaineder of his medication pill packs from recent SNF stay.  Along with his pill packs are the regular mail order RX bottles, OTC medications, and local RX bottles.  Wife has tried to separate and place all medications in separate pill boxes, but it has been extremely difficult and has resulted in about 10 different pill boxes.  Medication list was compared with recent SNF visit list, Epic list, and patient's recent discharge list.   Objective: Medications Reviewed Today    Reviewed by Elayne Guerin, [facility] (Pharmacist) on 09/23/17 at 1434  Med List Status: <None>  Medication Order Taking? Sig Documenting Provider Last Dose Status Informant  acetaminophen (TYLENOL) 325 MG tablet [id number] Yes Take 650 mg by mouth every 4 (four) hours as needed for mild pain. [provider] Taking Active Nursing Home Medication Administration Guide (MAG)  alendronate (FOSAMAX) 70 MG tablet [id number] Yes Take 70 mg by mouth once a week.  [provider] Taking Active Nursing Home Medication Administration Guide (MAG)           Med Note Nyoka Cowden, FELICIA D   Sat Sep 14, 2017  1:22 PM) Pt ran out   Amino Acids-Protein Hydrolys (FEEDING  SUPPLEMENT, PRO-STAT SUGAR FREE 64,) LIQD [id number] Yes Take 30 mLs by mouth 2 (two) times daily. Lavina Hamman, MD Taking Active Nursing Home Medication Administration Guide (MAG)  apixaban (ELIQUIS) 5 MG TABS tablet [id number] Yes Take 1 tablet (5 mg total) by mouth 2 (two) times daily. Barrett, Evelene Croon, PA-C Taking Active Nursing Home Medication Administration Guide (MAG)           Med Note Nyoka Cowden, FELICIA D   HYW Sep 14, 2017  1:19 PM)         Patient not taking:       Discontinued 73/71/06 2694 (Duplicate)   calcium carbonate (OS-CAL) 600 MG tablet [id number] Yes Take 600 mg by mouth daily after breakfast.  [provider] Taking Active Nursing Home Medication Administration Guide (MAG)           Med Note Nyoka Cowden, FELICIA D   Sat Sep 14, 2017  1:22 PM)         Patient not taking:       Discontinued 09/23/17 1427 (Change in therapy)   cholecalciferol (VITAMIN D) 1000 units tablet [id number] Yes Take 5,000 Units by mouth 2 (two) times daily.  [provider] Taking Active Nursing Home Medication Administration Guide (MAG)           Med Note Nyoka Cowden, FELICIA D   Sat Sep 14, 2017  1:22 PM)    Coenzyme Q10 (COQ10) 200 MG CAPS [id number] Yes Take 200 mg by mouth at bedtime. [provider] Taking Active Nursing Home Medication Administration Guide (MAG)  collagenase (SANTYL) ointment [id number] Yes Apply topically daily. Cristal Ford, DO Taking Active Nursing Home Medication Administration Guide (MAG)  cyanocobalamin 500 MCG tablet [id number] Yes Take 500 mcg by mouth at bedtime. [provider] Taking Active Nursing Home Medication Administration Guide (MAG)  diltiazem (CARDIZEM CD) 300 MG 24 hr capsule [id number] Yes Take 1 capsule (300 mg total) by mouth daily. Skeet Latch, MD Taking Active Nursing Home Medication Administration Guide (MAG)  divalproex (DEPAKOTE) 250 MG DR tablet [id number] Yes Take 3 tablets (750 mg total) by mouth every 12 (twelve) hours.   Patient taking differently:  Take 250-750 mg by mouth See admin instructions. Take 1 tablet in the morning, and 3 tablets in the evening.   Robbie Lis, MD Taking Active Nursing Home Medication Administration Guide (MAG)  docusate sodium (COLACE) 100 MG capsule [id number] Yes Take 200 mg by mouth 2 (two) times daily.  [provider] Taking Active Nursing Home Medication Administration Guide (MAG)           Med Note Juleen China, Higinio Plan Sep 04, 2017  1:59 PM) Takes as needed.  doxazosin (CARDURA) 4 MG tablet [id number] Yes Take 1 tablet (4 mg total) by mouth daily. Skeet Latch, MD Taking Active Nursing Home Medication Administration Guide [facility])       Patient not taking:       Discontinued 09/23/17 1432 (Change in therapy)   ferrous sulfate 325 (65 FE) MG tablet [id number] Yes Take 1 tablet (325 mg total) by mouth 2 (two) times daily with a meal. Skeet Latch, MD Taking Active Nursing Home Medication Administration Guide (MAG)  furosemide (LASIX) 40 MG tablet [id number] Yes Weight daily. If weight < 160 lb, do not give diuretic. If weight 160-170 lb, furosemide 40 mg daily. If weight >170 lb, furosemide 80 mg daily.  Patient taking differently:  Take 40 mg by mouth every other day.    [name], DO Taking Active Nursing Home Medication Administration Guide (MAG)  gabapentin (NEURONTIN) 300 MG capsule [id number] Yes Take 300 mg by mouth 2 (two) times daily. [provider] Taking Active Nursing Home Medication Administration Guide (MAG)  HYDROcodone-acetaminophen (NORCO) 10-325 MG tablet [id number] Yes Take 1 tablet by mouth every 6 (six) hours as needed for moderate pain. [name], DO Taking Active Nursing Home Medication Administration Guide (MAG)           Med Note Corinna Lines Sep 23, 2017  2:33 PM) Takes AM and PM  levothyroxine Wilmer Floor, LEVOTHROID) 150 MCG tablet [id number] Yes Take 150 mcg by mouth daily before breakfast. [provider] Taking Active Nursing Home Medication Administration Guide (MAG)  lidocaine (LIDODERM) 5 % [id number] Yes Place 1 patch onto the skin daily as needed. Remove & Discard patch within 12 hours or as directed by MD  Patient taking differently:  Place 1 patch onto the skin daily as needed (pain). Remove & Discard patch within 12 hours or as directed by MD    Cristal Ford, DO Taking Active Nursing Home Medication Administration Guide (MAG)           Med Note Nyoka Cowden, FELICIA D   Sat Sep 14, 2017  1:27 PM) Not currently wearing   metoprolol tartrate (LOPRESSOR) 25 MG tablet [id number] Yes Take 1 tablet (25 mg total) by mouth 2 (two) times daily. Skeet Latch, MD Taking Active Nursing Home Medication Administration Guide (MAG)  Multiple Vitamins-Minerals (CERTAVITE/ANTIOXIDANTS) TABS [id number] Yes Take 1 tablet by mouth  every evening.  [provider] Taking Active Nursing Home Medication Administration Guide (MAG)  nitroGLYCERIN (NITROSTAT) 0.4 MG SL tablet [id number] Yes Place 0.4 mg under the tongue every 5 (five) minutes as needed for chest pain. Reported on 07/20/2015 [provider] Taking Active Nursing Home Medication Administration Guide (MAG)  omeprazole (PRILOSEC) 20 MG capsule [id number] Yes Take 20 mg by mouth daily before breakfast.  [provider] Taking Active Nursing Home Medication Administration Guide (MAG)           Med Note Nyoka Cowden, FELICIA D   Sat Sep 14, 2017  1:21 PM)    Potassium Chloride ER 20 MEQ TBCR [id number] Yes Take only when taking lasix. Take 20mEq when taking Lasix 40mg . Take 34mEq when taking Lasix 80mg . Lasix dosing will be based on your daily weight.  Patient taking differently:  Take 20 mEq by mouth every other day.    [name], DO Taking Active Nursing Home Medication Administration Guide (MAG)  pravastatin (PRAVACHOL) 40 MG tablet [id number] Yes Take 40 mg by mouth at bedtime.  [provider] Taking Active Nursing Home  Medication Administration Guide (MAG)           Med Note Tamala Julian, JEFFREY W   Wed Jul 06, 2015  6:11 AM)    predniSONE (DELTASONE) 10 MG tablet [id number] Yes Take 1 tablet (10 mg total) by mouth daily with breakfast. Lavina Hamman, MD Taking Active Nursing Home Medication Administration Guide (MAG)           Med Note Erick Colace D   Sat Sep 14, 2017  1:24 PM)    senna (SENOKOT) 8.6 MG tablet [id number] Yes Take 1 tablet by mouth 2 (two) times daily as needed for constipation.  [provider] Taking Active Nursing Home Medication Administration Guide (MAG)           Med Note Nyoka Cowden, FELICIA D   Sat Sep 14, 2017  1:20 PM)    vitamin A 8000 UNIT capsule [id number] Yes Take 8,000 Units by mouth 2 (two) times daily. [provider] Taking Active Nursing Home Medication Administration Guide (MAG)  vitamin C (ASCORBIC ACID) 500 MG tablet [id number] Yes Take 500 mg by mouth every morning. [provider] Taking Active Nursing Home Medication Administration Guide (MAG)  ZETIA 10 MG tablet [id number] Yes Take 5 mg by mouth at bedtime.  [provider] Taking Active Nursing Home Medication Administration Guide (MAG)           Med Note Nyoka Cowden, FELICIA D   Sat Sep 14, 2017  1:20 PM)    Zinc 50 MG CAPS [id number] Yes Take 1 capsule by mouth daily. [provider] Taking Active   Med List Note Valere Dross 07/29/17 2023): [facility]         Assessment: Drugs sorted by system: Psych Divalproex, gabapentin  Cardiology Eliquis, diltiazem, ezetimibe, doxazosin, furosemide, metoprolol, nitroglycerin, pravastatin  Endocrine Alendronate, levothyroxine   GI Docusate, omeprazole, sennosides  Vitamin/Minerals calcium carbonate, ascorbic acid, cholecalciferol, ferrous sulfate, potassium, vitamin A, zinc  Pain: Hydrocodone/APAP  Topical Lidocaine patch, Collagenase ointment  Other issues noted:    -Patient still had older  medications in the home that were marked as "discontinued" in patient chart and no longer on current Epic med list: losartan, atenolol, sulindac, amlodipine, HCTZ-->wife wishes to dispose of meds herself at local police station.  Of note, patient was not taking any of these medications. -Lasix & potassium: patient/wife wants to  clarify dose since recent patient labs obtained on 09/14/17 and discharge from SNF -Vitamn D3: per chart he is to take 1000IU twice a day. Patient is taking Vitamin D3 148mcg(5000IU) twice a day. -Calcium 600mg  BID per chart. Patient is only taking 600mg  once daily -Alendronate: has not been refilled despite wife calling for refill request; want to clarify if patient still needs to be taking; Some research suggests bisphosphonate holiday may be prudent. -Omeprazole: patient is taking once daily (written as twice daily) -Vitamin A-patient wonders if he needs to continue to take -Zinc dose: patient only taking 36mcg daily, RX written for 275mcg daily; pt/wife want to clarify if still should be taking -Eliquis: patient will be out in ~2 weeks, bottle has no refills, refills needed  Plan: -Will contact Rosaria Ferries, PA (cardiologist) for clarification of lasix/potassium, refill on Eliquis -Will contact PCP Dr. Sharlett Iles for clarification of zinc sulfate, calcium, alendronate, omeprazole, vitamin A -Will f/u with patient in 3 business days touch base with patient and wife.  Discuss in the future if compliance packs or other med management strategies would be best   Regina Eck, PharmD, [place]  [phone]

## 2017-09-24 ENCOUNTER — Other Ambulatory Visit: Payer: Self-pay | Admitting: Cardiovascular Disease

## 2017-09-25 ENCOUNTER — Other Ambulatory Visit: Payer: Self-pay

## 2017-09-25 NOTE — Patient Outreach (Signed)
[facility]) Care Management  09/25/2017  [name] 1933/03/20 [id number]  Referral received 08/27/17 per Raynaldo Opitz, LCSW: client discharged from skilled facility-[facility] on 6/15.  84 year old with history of HTN, Heart failure, CKD, seizure disorder, CKD, Paroxysmal atrial fibrillation, memory loss, hypothyroid, OSA. Recently treated for urinary tract infection.   RNCM called to follow up. Spoke with client's wife(caregiver). She reports that [name] continues with home health services. She states that client has followed up with urologist and denies any problems or issues.   Plan: home visit next week. [facility] CM Care Plan Problem One     Most Recent Value  Care Plan Problem One  at risk for readmission.  Role Documenting the Problem One  Care Management [place] for Problem One  Active  [facility] Long Term Goal   client will not be readmitted within the next 31 days.  [facility] Long Term Goal Start Date  08/29/17  Interventions for Problem One Long Term Goal  discussed emergency room visit for urinary tract infection and reviewed signs/symptoms, discused upcoming provider appointments,  [facility] CM Short Term Goal #1   client will attend follow up appointments as scheduled within   [facility] CM Short Term Goal #1 Start Date  08/29/17  Interventions for Short Term Goal #1  reviewed upcoming appointments.  [facility] CM Short Term Goal #2   client/family will contact RNCM and/or 24 hour nurse advice line with health questions/concerns as needed within the next 30 days.  [facility] CM Short Term Goal #2 Start Date  08/29/17  Interventions for Short Term Goal #2  encouraged to call as needed.    [facility]N CM Care Plan Problem Two     Most Recent Value  Care Plan Problem Two  knowledge deficit regarding atrial fibrillation as evidence by wife asking for more information.  Role Documenting the Problem Two  Care Management [place] for Problem Two  Active  Interventions for  Problem Two Long Term Goal   reviewed atrial fibrillation action plan including green zone, yellow zone and red zone and actions associated with each.  [facility]N Long Term Goal  client/wife will verbalize action plan for atrial fibrillation within the next 31-60 days.  [facility]N Long Term Goal Start Date  09/04/17  [facility] CM Short Term Goal #1   client/wife will verbalize receipt of EMMI material on atrial fibrillation and booklet.withn the next 30 days.  [facility] CM Short Term Goal #1 Start Date  09/04/17  [facility] CM Short Term Goal #1 Met Date   09/25/17     Thea Silversmith, RN, MSN, [place] Coordinator Cell: [phone]

## 2017-09-25 NOTE — Patient Outreach (Signed)
[name]) Care Management  09/25/2017  [name] [birth year] [id number]   Initial outreach regarding social work referral for assistance with Advance Directives.  BSW spoke with patient's spouse [name].  Per [name], they have completed the Power of Attorney process but have not completed the living will.  She was familiar with the MOST and DNR forms but said that she would prefer that Mr. Moylan complete these again now that he is out of the hospital and is doing better.  BSW mailed MOST and DNR forms, Advanced Directives EMMI and packet.  [name] requested that BSW call her back on Friday, 10/04/17 to give them time to review these documents and be prepared with any questions they may have.  BSW will follow up then.   Ronn Melena, BSW Social Worker [phone]

## 2017-09-26 ENCOUNTER — Telehealth: Payer: Self-pay | Admitting: Physician Assistant

## 2017-09-26 ENCOUNTER — Ambulatory Visit: Payer: Self-pay | Admitting: Pharmacist

## 2017-09-26 ENCOUNTER — Other Ambulatory Visit: Payer: Self-pay | Admitting: Pharmacist

## 2017-09-26 NOTE — Telephone Encounter (Signed)
New Message       Almyra Free with [facility] is needing verification of dose of lasix and pottissum.    *STAT* If patient is at the pharmacy, call can be transferred to refill team.   1. Which medications need to be refilled? (please list name of each medication and dose if known) Eliquis 5mg   2. Which pharmacy/location (including street and city if local pharmacy) is medication to be sent to?[facility]  [address]  3. Do they need a 30 day or 90 day supply? [place]

## 2017-09-26 NOTE — Telephone Encounter (Signed)
He should be on furosemide 40 mg daily and KCl 20 mEq daily. However, unless we can find a way to obtain his weight on a regular basis, I am concerned that he will again have problems with either kidney failure or heart failure.

## 2017-09-26 NOTE — Telephone Encounter (Signed)
Error in transcription: " I suspect it is likely that it is indeed secondary to Eliquis.Marland KitchenMarland Kitchen"

## 2017-09-26 NOTE — Telephone Encounter (Signed)
Wife aware of MD advise. She states the bruised area is flat and that if she had not seen it, she would not notice it by just feeling of his leg. Advised to continue to monitor for changes.

## 2017-09-26 NOTE — Telephone Encounter (Signed)
Returned call to wife. Patient is home doing PT and noticed a "bruise" on the back of his leg that was noticed yesterday. She states it is large, purple/black/pink in color. Patient does not recall hitting this leg on anything, he is sedentary in a wheelchair, he reports the area is sore. She states he gets a lot of "blood places" on his arms, like how he will bleed under his skin. Patient went to dermatologist today but this doctor did not comment much on the bruise.   Wife is concerned this is from [place]. Advised that it is difficult to evaluate situation over the phone and he has no overt bleeding. Will route to CVRR & MD to review and advise

## 2017-09-26 NOTE — Telephone Encounter (Signed)
I suspect it is likely diabetes indeed secondary to the Eliquis, may be extra pressure when using the [facility] lift.  If he just has an ecchymosis (a flat bruise), I would put this in the category of "nuisance bleeding" that is likely to occur with chronic anticoagulation.  It is also quite possible he will have occasional epistaxis or bleeding gums when he brushes his teeth.  On the other hand, if it is a large raised, swollen hematoma, it may be worth further investigation.  MCr

## 2017-09-26 NOTE — Telephone Encounter (Signed)
It will be very challenging to treat his congestive heart failure in the presence of renal dysfunction, unless we can get reliable weights.  Based on his hospital stay we had established the following parameters:  - weight < 160 lb, do not give diuretic - weight 160-170 lb, furosemide 40 mg daily - weight >170 lb, furosemide 80 mg daily   Any way home health can help [place] with weights, at least every other day?  Is there a possibility to get a hospital bed that can weigh him?  MCr

## 2017-09-26 NOTE — Telephone Encounter (Signed)
Spoke with wife. Patient has a home scale but has back pain that impedes him from standing still long enough to weigh. Patient has a hospital bed but it does not have the capacity to weigh - wife states too expensive. She measures the circumference around his knee area and ankle area every morning and this has been stable. Patient wears compression stockings and elevates legs when resting and sitting in recliner. Wife listens to his lungs every morning as well.   Patient is only receiving [place] nursing services once a week. PT is seeing patient twice a week.   Patient's wife would like to know if he should take lasix/potassium as he has been, if lasix should be 20mg  or 40mg ?

## 2017-09-26 NOTE — Telephone Encounter (Addendum)
Returned call to [place] RPH with [facility]. She is calling to clarify patient's lasix & potassium. Explained the med instructions per last PA note but that there was a phone note where wife states she is having patient take lasix & potassium QOD. Patient cannot be weighed daily at home as he came home from SNF and they have to use a hoyer lift to move him.   Will notify PA/MD as Juluis Rainier.   Also, [place] states patient needs eliquis refill. Per chart review, eliquis was refilled on 7/16

## 2017-09-26 NOTE — Telephone Encounter (Signed)
New Message   Pt c/o medication issue:  1. Name of Medication: apixaban (ELIQUIS) 5 MG TABS tablet and furosemide (LASIX) 40 MG tablet  2. How are you currently taking this medication (dosage and times per day)? Weight daily. If weight < 160 lb, do not give diuretic. If weight 160-170 lb, furosemide 40 mg daily. If weight >170 lb, furosemide 80 mg daily. Patient taking differently: Take 40 mg by mouth every other day. And Take 1 tablet (5 mg total) by mouth 2 (two) times daily  3. Are you having a reaction (difficulty breathing--STAT)? yes  4. What is your medication issue?  Pt's wife states that the pt has a large purple and black bruise on the back of his leg. She wants to know if he is taking the right dosage of lasix. Please call

## 2017-09-26 NOTE — Patient Outreach (Addendum)
[facility]) Care Management  09/26/2017  [name] 1933/11/22 [id number]   Left message with [place] (PA-C Rhonda Barrett's RN) regarding patient's lasix/potassium dose since recent labs obtained on 09/16/17.  Also, requested Eliquis refill be sent to [place] ([id number]).  Per wife, no changes in physical assessment and no lower extremity edema noted on exam.  RN from Rosaria Ferries returned my call.  Most recent RX for lasix states: "Weigh daily. If weight < 160 lb, do not give diuretic. If weight 160-170 lb, furosemide 40 mg daily. If weight >170 lb, furosemide 80 mg daily".  Mentioned to RN that wife unable to weigh patient.  She will clarify dosing and get back with me.  She will call in Eliquis to [place].  Plan:  I will follow up tomorrow with patient and MD response    Regina Eck, PharmD, [place]  [phone]

## 2017-09-27 ENCOUNTER — Telehealth: Payer: Self-pay | Admitting: Cardiovascular Disease

## 2017-09-27 ENCOUNTER — Other Ambulatory Visit: Payer: Self-pay | Admitting: Pharmacist

## 2017-09-27 ENCOUNTER — Ambulatory Visit: Payer: Self-pay | Admitting: Pharmacist

## 2017-09-27 DIAGNOSIS — R600 Localized edema: Secondary | ICD-10-CM

## 2017-09-27 DIAGNOSIS — R0602 Shortness of breath: Secondary | ICD-10-CM

## 2017-09-27 DIAGNOSIS — Z5181 Encounter for therapeutic drug level monitoring: Secondary | ICD-10-CM

## 2017-09-27 NOTE — Telephone Encounter (Signed)
Advised wife, verbalized understanding.  

## 2017-09-27 NOTE — Telephone Encounter (Signed)
Spoke with patients wife and she thinks patients leg is swelling. She will try to have PT send pictures to me and will forward to Dr Sallyanne Kuster for review.

## 2017-09-27 NOTE — Patient Outreach (Addendum)
[facility]) Care Management  09/27/2017  [name] 07/10/1933 [id number]  Successful call to [name]'s spouse [name]. HIPAA identifiers verified.  [facility] Pharmacist called to follow up with patient after speaking with cardiology office.  Per documentation, cardiologist states new lasix directions: "take 40mg  daily" and potassium "take 21mEq daily".  Medication list was updated by cardiology office in [facility].  Patient's wife is comfortable with these instructions.  She knows to monitor for urine output and will have labs at follow up visit at the end of August.  Incoming call from Dr. Shon Baton RN [facility]).  She stated [name] wants to continue the Fosamax given chronic prednisone.  Okay to discontinue the vit A, Vit C, and zinc.  Okay to continue taking Prilosec daily.  Plan: -Will follow up with patient's wife on Monday telephonically and plan to visit to patient to arrange medications in the home.  [name], PharmD, [place]  [phone]

## 2017-09-27 NOTE — Telephone Encounter (Signed)
Okay, this will be a lot of guess work. Let us try increasing the furosemide to 80 mg daily, until his calves get down to about 14 inches. Then can reduce the dose of furosemide back to 40 mg daily.  Whenever doubling the dose of furosemide, should also double the potassium supplement.  Please call us back with report about his edema next week.  Please also check a bnp, bmet and magnesium next Monday.  MCr

## 2017-09-27 NOTE — Addendum Note (Signed)
Addended by: Fidel Levy on: 09/27/2017 09:12 AM   Modules accepted: Orders

## 2017-09-27 NOTE — Telephone Encounter (Signed)
New Message      Patient had physical therapy today and the therapist seen a huge place on the back of the patient leg. They would like to send pictures to some one or an e-mail so that Dr. Sallyanne Kuster. I wasn't sure if we could do this or not. Before hanging up patient's wife informed me that she called about this yesterday....  Pls advise.

## 2017-09-27 NOTE — Telephone Encounter (Signed)
Wife called back and she did measure patients leg below the knee. It usually is around 13 & 3/4,  this am 14 & 1/8 and now 15 & 1/4. He was having some shortness of breath while up ambulating with PT today. Have not received photos yet Will forward to Dr Sallyanne Kuster for review.

## 2017-09-27 NOTE — Telephone Encounter (Signed)
Patient's wife aware of MD suggestion for lasix & potassium dose. Med list updated. Wife asked if there is a way to know Explained that if his UOP drops, this could be a sign of worsening renal insufficiency. Advised to monitor this.

## 2017-09-30 ENCOUNTER — Telehealth: Payer: Self-pay

## 2017-09-30 ENCOUNTER — Ambulatory Visit: Payer: Self-pay | Admitting: Pharmacist

## 2017-09-30 ENCOUNTER — Other Ambulatory Visit: Payer: Self-pay | Admitting: Pharmacist

## 2017-09-30 LAB — MAGNESIUM: Magnesium: 2.1 mg/dL (ref 1.6–2.3)

## 2017-09-30 LAB — BASIC METABOLIC PANEL
BUN/Creatinine Ratio: 28 — ABNORMAL HIGH (ref 10–24)
BUN: 43 mg/dL — ABNORMAL HIGH (ref 8–27)
CO2: 27 mmol/L (ref 20–29)
Calcium: 8.9 mg/dL (ref 8.6–10.2)
Chloride: 105 mmol/L (ref 96–106)
Creatinine, Ser: 1.55 mg/dL — ABNORMAL HIGH (ref 0.76–1.27)
GFR calc Af Amer: 48 mL/min/{1.73_m2} — ABNORMAL LOW (ref >59)
GFR calc non Af Amer: 41 mL/min/{1.73_m2} — ABNORMAL LOW (ref >59)
Glucose: 123 mg/dL — ABNORMAL HIGH (ref 65–99)
Potassium: 5.1 mmol/L (ref 3.5–5.2)
Sodium: 147 mmol/L — ABNORMAL HIGH (ref 134–144)

## 2017-09-30 LAB — PRO B NATRIURETIC PEPTIDE: NT-Pro BNP: 3272 pg/mL — ABNORMAL HIGH (ref 0–486)

## 2017-09-30 NOTE — Telephone Encounter (Signed)
Thank you, Maudie Mercury! MCr

## 2017-09-30 NOTE — Telephone Encounter (Signed)
Pt with wife come into office today to get lab work. Wife request to see a nurse to discuss bruising on pt left calf/thigh. Reviewing notes from Friday, pt bruising has spread some (from mid-calf to the top of the tight) on the back side of the leg. Pt states it does not hurt, it is cool to the touch, and the wife states thought it has spread some it is much better in color then it was last week. The is pink/purple in color when it was dark purple almost black. It is still flat not raised or oozing. The patient leg mearusements are close to goal today, 14 1/8th inches, goal of 14 inches.  Educated importance to keep a watch on the burisig and since it is getting better from before to continue to elevated when sitting and keep with daily calf measurements. Wife is worried that it is blood vessels bursting and he needs to be changed from Eliquis to something else. Educated pt and wife the risk of being on any blood thinner and potential to easily bruise. Verified understanding. Also, clarified that pt is to go back to taking 40mg  of Lasix daily and 49mEq of potassium, they verified understanding. Reminded of August appt with DR. C. No additional questions at this time.

## 2017-09-30 NOTE — Patient Outreach (Signed)
[facility]) Care Management  09/30/2017  [name] 1934/02/25 [id number]   Successful call to [name]'s spouse [name]. HIPAA identifiers verified.  [facility] Pharmacist called to follow up with patient after speaking with cardiology & PCP offices. Discussed medication changes per providers and will deliver new medication list to patient & spouse on Wednesday 10/02/17.    The following changes were discussed: -Lasix & potassium follow instructions per cards -lasix  "take 40mg  daily" and potassium "take 59mEq daily".   Per wife: labs were just obtained in cards office today  (previous note on 09/27/17- "try increasing the furosemide to 80 mg daily, until his calves get down to about 14 inches. Then can reduce the dose of furosemide back to 40 mg daily.  Whenever doubling the dose of furosemide, should also double the potassium supplement.") -Prilosec: okay to continue taking once daily -Vitamin A, C, and Zinc- okay to discontinue per Dr. Philip [name] -Fosamax-continue given chronic prednisone -CoEnzyme q10- okay to d/c if not helping patient per [name]'s note on 09/06/17  Plan:  -Will visit patient at home on 10/02/17  [name], PharmD, [place]  [phone]

## 2017-10-01 ENCOUNTER — Other Ambulatory Visit (HOSPITAL_COMMUNITY)
Admission: RE | Admit: 2017-10-01 | Discharge: 2017-10-01 | Disposition: A | Discharge status: Home / Self Care | Payer: Medicare Other | Source: Other Acute Inpatient Hospital | Attending: Internal Medicine | Admitting: Internal Medicine

## 2017-10-01 ENCOUNTER — Telehealth: Payer: Self-pay | Admitting: Cardiovascular Disease

## 2017-10-01 DIAGNOSIS — Z7952 Long term (current) use of systemic steroids: Secondary | ICD-10-CM | POA: Insufficient documentation

## 2017-10-01 DIAGNOSIS — L97812 Non-pressure chronic ulcer of other part of right lower leg with fat layer exposed: Secondary | ICD-10-CM | POA: Diagnosis not present

## 2017-10-01 LAB — DIFFERENTIAL
Basophils Absolute: 0 10*3/uL (ref 0.0–0.1)
Basophils Relative: 0 %
Eosinophils Absolute: 0 10*3/uL (ref 0.0–0.7)
Eosinophils Relative: 0 %
Lymphocytes Relative: 8 %
Lymphs Abs: 0.7 10*3/uL (ref 0.7–4.0)
Monocytes Absolute: 0.4 10*3/uL (ref 0.1–1.0)
Monocytes Relative: 5 %
Neutro Abs: 6.8 10*3/uL (ref 1.7–7.7)
Neutrophils Relative %: 87 %

## 2017-10-01 LAB — CBC
HCT: 30.8 % — ABNORMAL LOW (ref 39.0–52.0)
Hemoglobin: 9.6 g/dL — ABNORMAL LOW (ref 13.0–17.0)
MCH: 31.3 pg (ref 26.0–34.0)
MCHC: 31.2 g/dL (ref 30.0–36.0)
MCV: 100.3 fL — ABNORMAL HIGH (ref 78.0–100.0)
Platelets: 144 10*3/uL — ABNORMAL LOW (ref 150–400)
RBC: 3.07 MIL/uL — ABNORMAL LOW (ref 4.22–5.81)
RDW: 18 % — ABNORMAL HIGH (ref 11.5–15.5)
WBC: 7.9 10*3/uL (ref 4.0–10.5)

## 2017-10-01 NOTE — Telephone Encounter (Signed)
New Message:      Pt's wife is calling to see if the pt's lab results are back and have been read.

## 2017-10-01 NOTE — Telephone Encounter (Signed)
Called patient regarding lab work. Patient was advised of note below:  Notes recorded by Sanda Klein, MD on 09/30/2017 at 5:34 PM EDT Kidney function slightly worse, so that supports the decision to reduce the furosemide dose today. I guess we will go by his calf circumference until he is strong enough to stand on a scale. Sodium is running slightly high. I would increase his intake of fluids by about 250-300 mL every day (that is 8-10 oz of water or equivalent fluid).  Patients wife stated that they had more labs drawn today, and will call back tomorrow to see if Dr.Croitoru wants to change the medication from those labs.

## 2017-10-02 ENCOUNTER — Other Ambulatory Visit: Payer: Self-pay

## 2017-10-02 ENCOUNTER — Other Ambulatory Visit: Payer: Self-pay | Admitting: Pharmacist

## 2017-10-02 NOTE — Patient Outreach (Signed)
[facility]) Care Management  10/02/2017  PERL KERNEY 07-31-1933 [id number]  Home visit completed today at patient's home. His wife and [facility] RN CM Thea Silversmith were present for the visit. HIPAA identifiers were obtained. Patient is an 84 year old male with multiple medical conditions including but not limited to: HTN, seizure disorder, CKD stage III, A Fib, chronic back pain, hypothyroidism, and GERD.  SUBJECTIVE Patient appeared to be well and stated he was doing okay.  He continues to complain about the price of Eliquis and truly does not want to take this medication because it causes skin discoloration and could "make him bleed".  I encouraged patient to meet with his Cardiologist before discontinuing any medication and informed him that Eliquis has decreased bleeding when compared other DOACs on the market.  Lower extremity edema was noted.  His wife states she is concerned because his leg diameter has increased to 15" while wife reports a baseline of ~14" (measurement taken below left knee ortho incision).  Wife states they are trying to keep legs elevated throughout the day.  Of note, they are still unable to weigh patient daily given current condition.  I have no concerns about the patent's medication compliance with his heart failure regimen.  Latest lasix dose was reported to be 40mg  daily and 24mEq of potassium per wife and chart.    OBJECTIVE  Vitals taken by [facility] RN: BP 120/82, HR 61, O2 sats 98% Medications Reviewed Today    Reviewed by Lavera Guise, [facility] (Pharmacist) on 10/03/17 at [phone]  Med List Status: <None>  Medication Order Taking? Sig Documenting Provider Last Dose Status Informant  acetaminophen (TYLENOL) 325 MG tablet [id number] No Take 650 mg by mouth every 4 (four) hours as needed for mild pain. [provider] Taking Active Nursing Home Medication Administration Guide (MAG)  alendronate (FOSAMAX) 70 MG tablet [id number] No Take 70 mg by mouth  once a week.  [provider] Taking Active Nursing Home Medication Administration Guide (MAG)           Med Note Nyoka Cowden, FELICIA D   Sat Sep 14, 2017  1:22 PM) Pt ran out   Amino Acids-Protein Hydrolys (FEEDING SUPPLEMENT, PRO-STAT SUGAR FREE 64,) LIQD [id number] No Take 30 mLs by mouth 2 (two) times daily. Lavina Hamman, MD Taking Active Nursing Home Medication Administration Guide (MAG)  calcium carbonate (OS-CAL) 600 MG tablet [id number] No Take 600 mg by mouth daily after breakfast.  [provider] Taking Active Nursing Home Medication Administration Guide (MAG)           Med Note Nyoka Cowden, FELICIA D   Sat Sep 14, 2017  1:22 PM)    cholecalciferol (VITAMIN D) 1000 units tablet [id number] No Take 5,000 Units by mouth 2 (two) times daily.  [provider] Taking Active Nursing Home Medication Administration Guide (MAG)           Med Note Erick Colace D   Sat Sep 14, 2017  1:22 PM)          Discontinued 10/03/17 0957 (Completed Course)   collagenase (SANTYL) ointment [id number] No Apply topically daily. Cristal Ford, DO Taking Active Nursing Home Medication Administration Guide (MAG)  cyanocobalamin 500 MCG tablet [id number] No Take 500 mcg by mouth at bedtime. [provider] Taking Active Nursing Home Medication Administration Guide (MAG)  diltiazem (CARDIZEM CD) 300 MG 24 hr capsule [id number] No Take 1 capsule (300 mg total) by mouth daily. Skeet Latch, MD  Taking Active Nursing Home Medication Administration Guide (MAG)  divalproex (DEPAKOTE) 250 MG DR tablet [id number] No Take 3 tablets (750 mg total) by mouth every 12 (twelve) hours.  Patient taking differently:  Take 250-750 mg by mouth See admin instructions. Take 1 tablet in the morning, and 3 tablets in the evening.   Robbie Lis, MD Taking Active Nursing Home Medication Administration Guide (MAG)  docusate sodium (COLACE) 100 MG capsule [id number] No Take 200 mg by mouth 2 (two) times daily.   [provider] Taking Active Nursing Home Medication Administration Guide (MAG)           Med Note Juleen China, Higinio Plan Sep 04, 2017  1:59 PM) Takes as needed.  doxazosin (CARDURA) 4 MG tablet [id number] No Take 1 tablet (4 mg total) by mouth daily. Skeet Latch, MD Taking Active Nursing Home Medication Administration Guide (MAG)  ELIQUIS 5 MG TABS tablet [id number]  TAKE 1 TABLET(5 MG) BY MOUTH TWICE DAILY Croitoru, Mihai, MD  Active   ferrous sulfate 325 (65 FE) MG tablet [id number] No Take 1 tablet (325 mg total) by mouth 2 (two) times daily with a meal. Skeet Latch, MD Taking Active Nursing Home Medication Administration Guide (MAG)  furosemide (LASIX) 40 MG tablet [id number] No Take 40 mg by mouth daily. [provider] Taking Active   gabapentin (NEURONTIN) 300 MG capsule [id number] No Take 300 mg by mouth 2 (two) times daily. [provider] Taking Active Nursing Home Medication Administration Guide (MAG)  HYDROcodone-acetaminophen (NORCO) 10-325 MG tablet [id number] No Take 1 tablet by mouth every 6 (six) hours as needed for moderate pain. [name], DO Taking Active Nursing Home Medication Administration Guide (MAG)           Med Note Corinna Lines Sep 23, 2017  2:33 PM) Takes AM and PM  levothyroxine (SYNTHROID, LEVOTHROID) 150 MCG tablet [id number] No Take 150 mcg by mouth daily before breakfast. [provider] Taking Active Nursing Home Medication Administration Guide (MAG)  lidocaine (LIDODERM) 5 % [id number] No Place 1 patch onto the skin daily as needed. Remove & Discard patch within 12 hours or as directed by MD  Patient taking differently:  Place 1 patch onto the skin daily as needed (pain). Remove & Discard patch within 12 hours or as directed by MD    Cristal Ford, DO Taking Active Nursing Home Medication Administration Guide (MAG)           Med Note Nyoka Cowden, FELICIA D   Sat Sep 14, 2017  1:27 PM) Not currently wearing    metoprolol tartrate (LOPRESSOR) 25 MG tablet [id number] No Take 1 tablet (25 mg total) by mouth 2 (two) times daily. Skeet Latch, MD Taking Active Nursing Home Medication Administration Guide (MAG)  Multiple Vitamins-Minerals (CERTAVITE/ANTIOXIDANTS) TABS [id number] No Take 1 tablet by mouth every evening.  [provider] Taking Active Nursing Home Medication Administration Guide (MAG)  nitroGLYCERIN (NITROSTAT) 0.4 MG SL tablet [id number] No Place 0.4 mg under the tongue every 5 (five) minutes as needed for chest pain. Reported on 07/20/2015 [provider] Taking Active Nursing Home Medication Administration Guide (MAG)  omeprazole (PRILOSEC) 20 MG capsule [id number] No Take 20 mg by mouth daily before breakfast.  [provider] Taking Active Nursing Home Medication Administration Guide (MAG)           Med Note Nyoka Cowden, FELICIA D   Sat Sep 14, 2017  1:21 PM)    potassium chloride  SA (K-DUR,KLOR-CON) 20 MEQ tablet [id number] No Take 20 mEq by mouth daily. [provider] Taking Active   pravastatin (PRAVACHOL) 40 MG tablet [id number] No Take 40 mg by mouth at bedtime.  [provider] Taking Active Nursing Home Medication Administration Guide (MAG)           Med Note Tamala Julian, JEFFREY W   Wed Jul 06, 2015  6:11 AM)    predniSONE (DELTASONE) 10 MG tablet [id number] No Take 1 tablet (10 mg total) by mouth daily with breakfast. Lavina Hamman, MD Taking Active Nursing Home Medication Administration Guide (MAG)           Med Note Erick Colace D   Sat Sep 14, 2017  1:24 PM)    senna (SENOKOT) 8.6 MG tablet [id number] No Take 1 tablet by mouth 2 (two) times daily as needed for constipation.  [provider] Taking Active Nursing Home Medication Administration Guide (MAG)           Med Note Erick Colace D   Sat Sep 14, 2017  1:20 PM)          Discontinued 10/03/17 0958 (Completed Course)         Discontinued 10/03/17 0958 (Completed Course)   ZETIA 10  MG tablet [id number] No Take 5 mg by mouth at bedtime.  [provider] Taking Active Nursing Home Medication Administration Guide (MAG)           Med Note Nyoka Cowden, FELICIA D   Sat Sep 14, 2017  1:20 PM)    Zinc 50 MG CAPS [id number] No Take 1 capsule by mouth daily. [provider] Taking Active   Med List Note Valere Dross 07/29/17 2023): [facility]         ASSESSMENT   Other issues noted:    -Drowsiness:  Patient states that he has been drowsy since his return home from SNF.  He asked if any medications could be contributing.  Unfortunately, he has multiple medications & conditions that could be contributing to reported drowsiness.  Drowsiness is a common side effect of gabapentin.  Although the dose isn't on the higher end, the dose could be weaned to once nightly in attempts to reduce drowsiness.  Also, patient states he was started on gabapentin for leg/ortho pain, but this condition has resolved -Refill needed on Fosamax -Reviewed potential medications with patient and wife that PCP & cardiologist were okay with patient discontinuing per patient preference     Plan: -I will contact PCP regarding patient's concern for drowsiness & refill for Fosamax -I will follow up with the patient next week or sooner if MD responds  Regina Eck, PharmD, [place]  [phone]

## 2017-10-02 NOTE — Patient Outreach (Signed)
[facility]) Care Management   10/02/2017  [name] Aug 13, 1933 [id number]  [name] is an 84 y.o. male  Subjective: "I feel ok, I am just a little drowsy"  Objective:   Review of Systems  Respiratory:       Lungs clear  Cardiovascular: Positive for leg swelling.       Heart rate irregular, compression socks on.  Skin:       Left leg with faint bluish tint noted. Per wife the color has improved "a lot".    Physical Exam client sitting up in wheelchair, skin warm dry, bruising noted to both arms. Lower left leg faint bruise noted.  Encounter Medications:   Outpatient Encounter Medications as of 10/02/2017  Medication Sig Note  . acetaminophen (TYLENOL) 325 MG tablet Take 650 mg by mouth every 4 (four) hours as needed for mild pain.   Marland Kitchen alendronate (FOSAMAX) 70 MG tablet Take 70 mg by mouth once a week.  09/14/2017: Pt ran out   . Amino Acids-Protein Hydrolys (FEEDING SUPPLEMENT, PRO-STAT SUGAR FREE 64,) LIQD Take 30 mLs by mouth 2 (two) times daily.   . calcium carbonate (OS-CAL) 600 MG tablet Take 600 mg by mouth daily after breakfast.    . cholecalciferol (VITAMIN D) 1000 units tablet Take 5,000 Units by mouth 2 (two) times daily.    . Coenzyme Q10 (COQ10) 200 MG CAPS Take 200 mg by mouth at bedtime.   . collagenase (SANTYL) ointment Apply topically daily.   . cyanocobalamin 500 MCG tablet Take 500 mcg by mouth at bedtime.   Marland Kitchen diltiazem (CARDIZEM CD) 300 MG 24 hr capsule Take 1 capsule (300 mg total) by mouth daily.   . divalproex (DEPAKOTE) 250 MG DR tablet Take 3 tablets (750 mg total) by mouth every 12 (twelve) hours. (Patient taking differently: Take 250-750 mg by mouth See admin instructions. Take 1 tablet in the morning, and 3 tablets in the evening.)   . docusate sodium (COLACE) 100 MG capsule Take 200 mg by mouth 2 (two) times daily.  09/04/2017: Takes as needed.  . doxazosin (CARDURA) 4 MG tablet Take 1 tablet (4 mg total) by mouth daily.   Marland Kitchen  ELIQUIS 5 MG TABS tablet TAKE 1 TABLET(5 MG) BY MOUTH TWICE DAILY   . ferrous sulfate 325 (65 FE) MG tablet Take 1 tablet (325 mg total) by mouth 2 (two) times daily with a meal.   . furosemide (LASIX) 40 MG tablet Take 40 mg by mouth daily.   Marland Kitchen gabapentin (NEURONTIN) 300 MG capsule Take 300 mg by mouth 2 (two) times daily.   Marland Kitchen HYDROcodone-acetaminophen (NORCO) 10-325 MG tablet Take 1 tablet by mouth every 6 (six) hours as needed for moderate pain. 09/23/2017: Takes AM and PM  . levothyroxine (SYNTHROID, LEVOTHROID) 150 MCG tablet Take 150 mcg by mouth daily before breakfast.   . lidocaine (LIDODERM) 5 % Place 1 patch onto the skin daily as needed. Remove & Discard patch within 12 hours or as directed by MD (Patient taking differently: Place 1 patch onto the skin daily as needed (pain). Remove & Discard patch within 12 hours or as directed by MD ) 09/14/2017: Not currently wearing   . metoprolol tartrate (LOPRESSOR) 25 MG tablet Take 1 tablet (25 mg total) by mouth 2 (two) times daily.   . Multiple Vitamins-Minerals (CERTAVITE/ANTIOXIDANTS) TABS Take 1 tablet by mouth every evening.    . nitroGLYCERIN (NITROSTAT) 0.4 MG SL tablet Place 0.4 mg under the tongue  every 5 (five) minutes as needed for chest pain. Reported on 07/20/2015   . omeprazole (PRILOSEC) 20 MG capsule Take 20 mg by mouth daily before breakfast.    . potassium chloride SA (K-DUR,KLOR-CON) 20 MEQ tablet Take 20 mEq by mouth daily.   . pravastatin (PRAVACHOL) 40 MG tablet Take 40 mg by mouth at bedtime.    . predniSONE (DELTASONE) 10 MG tablet Take 1 tablet (10 mg total) by mouth daily with breakfast.   . senna (SENOKOT) 8.6 MG tablet Take 1 tablet by mouth 2 (two) times daily as needed for constipation.    . vitamin A 8000 UNIT capsule Take 8,000 Units by mouth 2 (two) times daily.   . vitamin C (ASCORBIC ACID) 500 MG tablet Take 500 mg by mouth every morning.   Marland Kitchen ZETIA 10 MG tablet Take 5 mg by mouth at bedtime.    . Zinc 50 MG CAPS  Take 1 capsule by mouth daily.    No facility-administered encounter medications on file as of 10/02/2017.     Functional Status:   In your present state of health, do you have any difficulty performing the following activities: 09/04/2017 07/11/2017  Hearing? N N  Vision? N N  Difficulty concentrating or making decisions? N Y  Comment - -  Walking or climbing stairs? Y Y  Dressing or bathing? Y Y  Doing errands, shopping? Y Y  In the past six months, have you accidently leaked urine? Y -  Comment occasionlally more controlled now the more I am at home. -  Do you have problems with loss of bowel control? Y -  Comment occasionlly, but more controlled since being home. -  Some recent data might be hidden    Fall/Depression Screening:    Fall Risk  09/04/2017  Falls in the past year? No   PHQ 2/9 Scores 09/04/2017  PHQ - 2 Score 0    Assessment: Referral received 6/18 per Robert Opitz, [name]: client discharged from skilled facility-[name] on 6/15.  84 year old with history of HTN, Heart failure, CKD, seizure disorder, CKD, Paroxysmal atrial fibrillation, memory loss, hypothyroid, OSA  Co-visit completed with [name], [name] pharmacist. Client sitting up in wheelchair, reports he is doing ok except gets tired easy. He reports he is still active with home health. For physical therapy and nursing.   History of Atrial fibrillation-Both Mr. And [name] expressed viewing EMMI video on atrial fibrillation and expressed it helped their understanding of the disease and medications prescribed.  Wife verbalized that the end of last week client had increased edema to legs and increased bruising. She reports that when client came home from [facility] his calf measurement was 12 7/8' and was up to 151/4" at the end of last week. She states she called cardiologist and followed instructions given. She states the measurement of his leg and bruising has decreased and is 14" today. [name]  reports she has noticed a decrease in client's urine, but will continue to monitor. Ms. Maggio and client both expressed concern regarding next visit with cardiologist and would like a sooner appointment as they are not able to get accurate rates on client. Sooner appointment scheduled with cardiologist office.  Plan: follow up next month telephonically to assess if any additional care management needs.  Thea Silversmith, RN, MSN, [place] Coordinator Cell: [phone]

## 2017-10-04 ENCOUNTER — Other Ambulatory Visit: Payer: Self-pay

## 2017-10-04 ENCOUNTER — Other Ambulatory Visit: Payer: Self-pay | Admitting: Pharmacist

## 2017-10-04 NOTE — Patient Outreach (Signed)
[facility]) Care Management  10/04/2017  [name] 04/04/1933 [id number]   Incoming call from patient's wife to clarify adidtional medication changes. HIPAA identifiers verified.  Additional medication reconcilliation performed and med list updated in [place] Pharmacist.  Ms. Kliethermes clarified the following medications:  -Depakote (250mg  in AM, 750mg  in PM) -Lasix/Potassium (40mg /60mEq-but continuing to titrate per Cardiology) -Santyl-PRN only as instructed by wound care clinic -Docusate-1-2 tabs PRN -Norco-1 tab q4 PRN pain  Of note, patient is only taking Eliquis once daily because he is scared on the bleeding risk and does not like the additional bruising/skin discoloration.  Encouraged wife to call Cardiology office to receive medical advice.   Plan: -Message sent via in-basket to cardiologist for update on Eliquis--advise wife -Will deliver new med list to patient next week  Regina Eck, PharmD, [place]  [phone]

## 2017-10-07 ENCOUNTER — Telehealth: Payer: Self-pay | Admitting: Cardiovascular Disease

## 2017-10-07 ENCOUNTER — Other Ambulatory Visit (HOSPITAL_COMMUNITY)
Admission: RE | Admit: 2017-10-07 | Discharge: 2017-10-07 | Disposition: A | Discharge status: Home / Self Care | Payer: Medicare Other | Source: Other Acute Inpatient Hospital | Attending: Physician Assistant | Admitting: Physician Assistant

## 2017-10-07 ENCOUNTER — Other Ambulatory Visit: Payer: Self-pay | Admitting: Cardiovascular Disease

## 2017-10-07 DIAGNOSIS — D649 Anemia, unspecified: Secondary | ICD-10-CM

## 2017-10-07 DIAGNOSIS — R319 Hematuria, unspecified: Secondary | ICD-10-CM | POA: Insufficient documentation

## 2017-10-07 LAB — CBC
HCT: 33.8 % — ABNORMAL LOW (ref 39.0–52.0)
Hemoglobin: 10.5 g/dL — ABNORMAL LOW (ref 13.0–17.0)
MCH: 31 pg (ref 26.0–34.0)
MCHC: 31.1 g/dL (ref 30.0–36.0)
MCV: 99.7 fL (ref 78.0–100.0)
Platelets: 153 10*3/uL (ref 150–400)
RBC: 3.39 MIL/uL — ABNORMAL LOW (ref 4.22–5.81)
RDW: 18.6 % — ABNORMAL HIGH (ref 11.5–15.5)
WBC: 6.9 10*3/uL (ref 4.0–10.5)

## 2017-10-07 MED ORDER — DOXAZOSIN MESYLATE 4 MG PO TABS: 4.0000 mg | ORAL_TABLET | Freq: Every day | ORAL | 0 refills | Status: DC

## 2017-10-07 MED ORDER — METOPROLOL TARTRATE 25 MG PO TABS: 25.0000 mg | ORAL_TABLET | Freq: Two times a day (BID) | ORAL | 0 refills | Status: DC

## 2017-10-07 MED ORDER — POTASSIUM CHLORIDE CRYS ER 20 MEQ PO TBCR: 20.0000 meq | EXTENDED_RELEASE_TABLET | Freq: Every day | ORAL | 0 refills | Status: DC

## 2017-10-07 MED ORDER — DILTIAZEM HCL ER COATED BEADS 300 MG PO CP24: 300.0000 mg | ORAL_CAPSULE | ORAL | 0 refills | Status: DC

## 2017-10-07 MED ORDER — POTASSIUM CHLORIDE CRYS ER 20 MEQ PO TBCR: 20.0000 meq | EXTENDED_RELEASE_TABLET | Freq: Every day | ORAL | 3 refills | Status: DC

## 2017-10-07 NOTE — Telephone Encounter (Signed)
New Message:       Pt c/o medication issue:  1. Name of Medication: ELIQUIS 5 MG TABS tablet  2. How are you currently taking this medication (dosage and times per day)?TAKE 1 TABLET(5 MG) BY MOUTH TWICE DAILY  3. Are you having a reaction (difficulty breathing--STAT)? No  4. What is your medication issue? Pt is calling and states they would like for Dr. Loletha Grayer to look back at some labs the pt had done at [facility]. States pt had a BMP and the blood was blue/purple/green. She states this pt is not wanting to take this medication anymore and would just like to take baby aspirin and would like to know how many he should be taking.

## 2017-10-07 NOTE — Telephone Encounter (Signed)
Per the patient's wife, the patient has discontinued the Eliquis as of two days ago due to decreased hemoglobin.  The patient had an appointment at the wound center last week and it was noted that the patient's leg had extensive bruising. The wound doctor sent the patient to [facility] to have a CBC lab drawn. Hemoglobin was 9.6.   Per Rosaria Ferries, PA, the patient should have a repeat CBC done today. The wife stated that the patient has an urologist appointment today and after that appointment the patient will go to [place] to have the repeat CBC. The wife did note that the patient's leg did look better today and was not as dark as it had been. He denies shortness of breath and chest pain.

## 2017-10-07 NOTE — Telephone Encounter (Signed)
Refill sent to the pharmacy electronically.  

## 2017-10-07 NOTE — Patient Outreach (Signed)
[place] [facility]) Care Management  10/07/2017  [name] [birth year] [id number]   Follow up call to ensure receipt of Advance Directives packet and EMMI as well as MOST and DNR forms that were mailed on 09/25/17.  BSW spoke with [name] and his spouse, [name].  We discussed the specifics of Power of Attorney and Living Will.  [name] had some very specific questions about the MOST and DNR forms that, unfortunately, BSW was unable to answer.  BSW agreed to consult with colleagues about these questions and follow up with her and [name] next week.   Ronn Melena, BSW Social Worker [phone]

## 2017-10-07 NOTE — Telephone Encounter (Signed)
New Message:         *STAT* If patient is at the pharmacy, call can be transferred to refill team.   1. Which medications need to be refilled? (please list name of each medication and dose if known) potassium chloride SA (K-DUR,KLOR-CON) 20 MEQ tablet  2. Which pharmacy/location (including street and city if local pharmacy) is medication to be sent to?[facility] [place], [place] AT [address]  3. Do they need a 30 day or 90 day supply? [place]

## 2017-10-07 NOTE — Telephone Encounter (Signed)
The patient's wife would like refills on Cardizem, Metoprolol, Cardura, and potassium. A month refill has been sent in for her. Appointment is 8/13.

## 2017-10-07 NOTE — Telephone Encounter (Signed)
New Message:       Pt's wife states they have now switched to seeing Dr. Loletha Grayer and they would like to new prescriptions sent over to have Dr. Lurline Del name on them.

## 2017-10-08 ENCOUNTER — Ambulatory Visit: Payer: Self-pay

## 2017-10-08 NOTE — Telephone Encounter (Signed)
Rx request sent to pharmacy.  

## 2017-10-09 ENCOUNTER — Ambulatory Visit: Payer: Self-pay

## 2017-10-09 ENCOUNTER — Other Ambulatory Visit: Payer: Self-pay | Admitting: Pharmacist

## 2017-10-09 NOTE — Patient Outreach (Signed)
[facility]) Care Management  10/09/2017  [name] 02-Jan-1934 [id number]   Incoming call from patient's wife, [name].  HIPAA identifiers verified.  She called and stated that her husband has self-discontinued his Eliquis.  I encouraged her to call cardiology office. Of note, patient's hgb up to 10.5 (10/07/17).  PLAN: -Message sent to Rosaria Ferries, PA-C per patient request to advise patient on how to proceed -I will follow up tomorrow to check on the status of request   Regina Eck, PharmD, [place]  [phone]

## 2017-10-10 ENCOUNTER — Ambulatory Visit: Payer: Self-pay | Admitting: Pharmacist

## 2017-10-10 ENCOUNTER — Telehealth: Payer: Self-pay | Admitting: Physician Assistant

## 2017-10-10 ENCOUNTER — Other Ambulatory Visit: Payer: Self-pay | Admitting: Pharmacist

## 2017-10-10 ENCOUNTER — Other Ambulatory Visit: Payer: Self-pay

## 2017-10-10 NOTE — Telephone Encounter (Signed)
New Message:     Pt have stopped his Eliquis and is now just take 1 81mg  Aspirin a day. Pt wants to know if this is alright?. Pt had lab work again on Monday, waiting for those results.

## 2017-10-10 NOTE — Telephone Encounter (Signed)
Spoke with pts wife who states pt had a repeat cbc and hgb is starting to increase but pt is hesitate to start back taking the eliquis. She reports pt is now just taking ASA 81 mg and would like to know if that would be ok. She states they understand from a cardiac standpoint that its not equivalent to Eliquis but just scared to take it again. Routing to [employer] for recommendation.

## 2017-10-10 NOTE — Patient Outreach (Signed)
[facility]) Care Management  10/10/2017  [name] Jan 25, 1934 [id number]  Follow up call to patient's spouse, [name], regarding additional questions she had about MOST and DNR forms.  This BSW and BSW, Daneen Schick, talked with [name] in order to answer the questions that she had about these forms.  [name] was encouraged to schedule an appointment with one of his medical providers in order to complete the MOST form.  BSW is closing case at this time due to no other social work needs being identified.    Ronn Melena, BSW Social Worker [phone]

## 2017-10-10 NOTE — Patient Outreach (Signed)
[facility]) Care Management  10/10/2017  MATH BRAZIE 1933/12/08 [id number]   Successful call to patient's wife, [name] to discuss medications.  HIPAA identifiers verified. She continues to state patient is only on ASA 81mg  daily and has self discontinued the Eliquis.  Encouraged wife to call cardiologist directly as my messages have not been returned. Wife has called cardiology office per documentation in [facility] and she will continue to follow plan.  PLAN:   -Pharmacy to sign off case as medication reconciliation has been performed.  I'm happy to assist in the future as needed. -Patient's spouse to continue to follow up with cardiology with titration of lasix/potassium and anticoagulation decision. -Will route discipline case close letters and alert remaining [facility] team members  Regina Eck, PharmD, [place]  [phone]

## 2017-10-11 NOTE — Telephone Encounter (Signed)
Patient spouse, [name], and she voiced understanding but wanted to make sure it is ok for her husband to take 81mg  of Asa.

## 2017-10-11 NOTE — Telephone Encounter (Signed)
Please let her know that I understand their concern. With Eliquis, you constantly have to reassess the risk versus benefit. Dr. Sallyanne Kuster is on vacation, I will get his input when he gets back. For now, agree with holding the medication. Thanks

## 2017-10-14 NOTE — Telephone Encounter (Signed)
Yes, ASA 81 mg qd is ok.

## 2017-10-15 ENCOUNTER — Encounter (HOSPITAL_BASED_OUTPATIENT_CLINIC_OR_DEPARTMENT_OTHER): Payer: Medicare Other | Attending: Internal Medicine

## 2017-10-15 DIAGNOSIS — L97822 Non-pressure chronic ulcer of other part of left lower leg with fat layer exposed: Secondary | ICD-10-CM | POA: Diagnosis not present

## 2017-10-15 DIAGNOSIS — I2510 Atherosclerotic heart disease of native coronary artery without angina pectoris: Secondary | ICD-10-CM | POA: Insufficient documentation

## 2017-10-15 DIAGNOSIS — I10 Essential (primary) hypertension: Secondary | ICD-10-CM | POA: Insufficient documentation

## 2017-10-15 DIAGNOSIS — L97812 Non-pressure chronic ulcer of other part of right lower leg with fat layer exposed: Secondary | ICD-10-CM | POA: Diagnosis present

## 2017-10-15 DIAGNOSIS — G4730 Sleep apnea, unspecified: Secondary | ICD-10-CM | POA: Diagnosis not present

## 2017-10-15 MED ORDER — ASPIRIN EC 81 MG PO TBEC: 81.0000 mg | DELAYED_RELEASE_TABLET | Freq: Every day | ORAL | 3 refills | Status: DC

## 2017-10-15 MED ORDER — APIXABAN 5 MG PO TABS: ORAL_TABLET | ORAL | 1 refills | Status: DC

## 2017-10-15 NOTE — Telephone Encounter (Signed)
Patients wife, [name], notified directly.

## 2017-10-22 ENCOUNTER — Encounter: Payer: Self-pay | Admitting: Cardiology

## 2017-10-22 ENCOUNTER — Ambulatory Visit (INDEPENDENT_AMBULATORY_CARE_PROVIDER_SITE_OTHER): Payer: Medicare Other | Admitting: Cardiology

## 2017-10-22 VITALS — BP 94/56 | HR 74 | Ht 69.0 in | Wt 170.4 lb

## 2017-10-22 DIAGNOSIS — I10 Essential (primary) hypertension: Secondary | ICD-10-CM

## 2017-10-22 DIAGNOSIS — M549 Dorsalgia, unspecified: Secondary | ICD-10-CM

## 2017-10-22 DIAGNOSIS — R58 Hemorrhage, not elsewhere classified: Secondary | ICD-10-CM | POA: Insufficient documentation

## 2017-10-22 DIAGNOSIS — Z9861 Coronary angioplasty status: Secondary | ICD-10-CM

## 2017-10-22 DIAGNOSIS — I482 Chronic atrial fibrillation, unspecified: Secondary | ICD-10-CM

## 2017-10-22 DIAGNOSIS — G8929 Other chronic pain: Secondary | ICD-10-CM

## 2017-10-22 DIAGNOSIS — I5032 Chronic diastolic (congestive) heart failure: Secondary | ICD-10-CM

## 2017-10-22 DIAGNOSIS — N183 Chronic kidney disease, stage 3 unspecified: Secondary | ICD-10-CM

## 2017-10-22 DIAGNOSIS — K219 Gastro-esophageal reflux disease without esophagitis: Secondary | ICD-10-CM

## 2017-10-22 DIAGNOSIS — D638 Anemia in other chronic diseases classified elsewhere: Secondary | ICD-10-CM

## 2017-10-22 DIAGNOSIS — I2510 Atherosclerotic heart disease of native coronary artery without angina pectoris: Secondary | ICD-10-CM | POA: Diagnosis not present

## 2017-10-22 LAB — BASIC METABOLIC PANEL
BUN/Creatinine Ratio: 28 — ABNORMAL HIGH (ref 10–24)
BUN: 42 mg/dL — ABNORMAL HIGH (ref 8–27)
CO2: 26 mmol/L (ref 20–29)
Calcium: 8.8 mg/dL (ref 8.6–10.2)
Chloride: 102 mmol/L (ref 96–106)
Creatinine, Ser: 1.48 mg/dL — ABNORMAL HIGH (ref 0.76–1.27)
GFR calc Af Amer: 50 mL/min/{1.73_m2} — ABNORMAL LOW (ref >59)
GFR calc non Af Amer: 43 mL/min/{1.73_m2} — ABNORMAL LOW (ref >59)
Glucose: 97 mg/dL (ref 65–99)
Potassium: 4.3 mmol/L (ref 3.5–5.2)
Sodium: 143 mmol/L (ref 134–144)

## 2017-10-22 LAB — CBC
Hematocrit: 31.7 % — ABNORMAL LOW (ref 37.5–51.0)
Hemoglobin: 10.3 g/dL — ABNORMAL LOW (ref 13.0–17.7)
MCH: 30.9 pg (ref 26.6–33.0)
MCHC: 32.5 g/dL (ref 31.5–35.7)
MCV: 95 fL (ref 79–97)
Platelets: 134 10*3/uL — ABNORMAL LOW (ref 150–450)
RBC: 3.33 x10E6/uL — ABNORMAL LOW (ref 4.14–5.80)
RDW: 15.8 % — ABNORMAL HIGH (ref 12.3–15.4)
WBC: 7.8 10*3/uL (ref 3.4–10.8)

## 2017-10-22 LAB — PRO B NATRIURETIC PEPTIDE: NT-Pro BNP: 3243 pg/mL — ABNORMAL HIGH (ref 0–486)

## 2017-10-22 MED ORDER — DOXAZOSIN MESYLATE 4 MG PO TABS: 4.0000 mg | ORAL_TABLET | Freq: Every evening | ORAL | 0 refills | Status: DC

## 2017-10-22 NOTE — Patient Instructions (Addendum)
Medication Instructions:  DISCONTINUE Eliquis START Baby Aspirin 81mg  take 1 tablet once a day TAKE THE CARDURA IN THE EVENING   Labwork: Your physician recommends that you return for lab work in: TODAY-BMET, BNP, CBC  Testing/Procedures: NONE   Follow-Up: FOLLOW UP AS SCHEDULED  Any Other Special Instructions Will Be Listed Below (If Applicable). If you need a refill on your cardiac medications before your next appointment, please call your pharmacy.

## 2017-10-22 NOTE — Assessment & Plan Note (Signed)
Pt is wheelchair bound.

## 2017-10-22 NOTE — Assessment & Plan Note (Signed)
Check BMP today 

## 2017-10-22 NOTE — Assessment & Plan Note (Signed)
Remote PCI in '02. Normal LBVF by echo feb 2019

## 2017-10-22 NOTE — Assessment & Plan Note (Signed)
Slightly hypotensive today-106/60

## 2017-10-22 NOTE — Assessment & Plan Note (Signed)
Wgts unobtainable as pt can't stand without assistance. His wife measures his calf diameter and adjusts Lasix based n this

## 2017-10-22 NOTE — Assessment & Plan Note (Signed)
Spontaneous hemorrhage LLE on Eliquis- pt stopped and started ASA 81 mg

## 2017-10-22 NOTE — Assessment & Plan Note (Signed)
Rate controlled 

## 2017-10-22 NOTE — Progress Notes (Addendum)
10/22/2017 [name]   Jun 12, 1933  [id number]  Primary Physician [name], MD Primary Cardiologist: Dr Sallyanne Kuster  HPI:  84 y.o. male, retired Software engineer, with a history of RCA PCI 2002,HTN, HLD, GERD with aspiration, OSA, CRI-3, chronic right leg wound followed by the [facility], D-CHF, and chronicatrial fib diagnosed 04/2017. He had been on Eliquis w/ CHA2DS2-VASc = 5 (age x 2, CAD, HTN, CHF). His wife follows his calf diameter to determine his Lasix dose since he can't stand for wgts. He is in the office today because he had to come off Eliquis. The pt had a significant spontaneous bleed in his Lt leg. He had ecchymosis from his thigh to his foot. His Hgb dropped from 11.0- 9.6. He stopped Eliquis and started ASA 81 mg.  Otherwise he has done pretty well, no unusual dyspnea. His wife accompanied him and keeps meticulous records of his vital signs.   Current Outpatient Medications  Medication Sig Dispense Refill  . acetaminophen (TYLENOL) 325 MG tablet Take 650 mg by mouth every 4 (four) hours as needed for mild pain.    Marland Kitchen alendronate (FOSAMAX) 70 MG tablet Take 70 mg by mouth once a week. On Wednesday. Remain upright for 30-60 minutes.    . Amino Acids-Protein Hydrolys (FEEDING SUPPLEMENT, PRO-STAT SUGAR FREE 64,) LIQD Take 30 mLs by mouth 2 (two) times daily. 900 mL 0  . aspirin EC 81 MG tablet Take 1 tablet (81 mg total) by mouth daily. 90 tablet 3  . calcium carbonate (OS-CAL) 600 MG tablet Take 600 mg by mouth daily after breakfast.     . collagenase (SANTYL) ointment Apply 1 application topically daily as needed (as instructed by [facility].).    Marland Kitchen cyanocobalamin 500 MCG tablet Take 500 mcg by mouth at bedtime.    Marland Kitchen diltiazem (CARDIZEM CD) 300 MG 24 hr capsule Take 300 mg by mouth daily.    . divalproex (DEPAKOTE) 250 MG DR tablet Take 250 mg by mouth every morning.    . divalproex (DEPAKOTE) 250 MG DR tablet Take 750 mg by mouth every evening.    . docusate  sodium (COLACE) 100 MG capsule Take 100-200 mg by mouth daily as needed.     . doxazosin (CARDURA) 4 MG tablet Take 1 tablet (4 mg total) by mouth every evening. 30 tablet 0  . ferrous sulfate 325 (65 FE) MG tablet Take 1 tablet (325 mg total) by mouth 2 (two) times daily with a meal. 60 tablet 0  . furosemide (LASIX) 40 MG tablet Take 40 mg by mouth every morning. Or per cardiologist instructions since patient cannot be weighed    . HYDROcodone-acetaminophen (NORCO) 10-325 MG tablet Take 1 tablet by mouth every 4 (four) hours as needed for moderate pain.    Marland Kitchen levothyroxine (SYNTHROID, LEVOTHROID) 150 MCG tablet Take 150 mcg by mouth daily before breakfast.    . lidocaine (LIDODERM) 5 % Place 1 patch onto the skin daily as needed. Remove & Discard patch within 12 hours or as directed by MD (Patient taking differently: Place 1 patch onto the skin daily as needed (pain). Remove & Discard patch within 12 hours or as directed by MD ) 30 patch 0  . metoprolol tartrate (LOPRESSOR) 25 MG tablet Take 1 tablet (25 mg total) by mouth 2 (two) times daily. 60 tablet 0  . Multiple Vitamins-Minerals (CERTAVITE/ANTIOXIDANTS) TABS Take 1 tablet by mouth every evening.     . nitroGLYCERIN (NITROSTAT) 0.4 MG SL tablet  Place 0.4 mg under the tongue every 5 (five) minutes as needed for chest pain. Max of 3 tablets    . omeprazole (PRILOSEC) 20 MG capsule Take 20 mg by mouth daily before breakfast.     . potassium chloride SA (K-DUR,KLOR-CON) 20 MEQ tablet Take 1 tablet (20 mEq total) by mouth daily. Along with lasix 40mg  or as directed by cardiologist. 30 tablet 3  . pravastatin (PRAVACHOL) 40 MG tablet Take 40 mg by mouth at bedtime.   2  . predniSONE (DELTASONE) 10 MG tablet Take 1 tablet (10 mg total) by mouth daily with breakfast.    . senna (SENOKOT) 8.6 MG tablet Take 1 tablet by mouth 2 (two) times daily as needed for constipation.     Marland Kitchen VITAMIN D, ERGOCALCIFEROL, PO Take 5,000 Units by mouth 2 (two) times daily.     Marland Kitchen ZETIA 10 MG tablet Take 5 mg by mouth at bedtime. (0.5 tablet)     No current facility-administered medications for this visit.     Allergies  Allergen Reactions  . Eliquis [Apixaban] Other (See Comments)    bleeding  . Demerol [Meperidine] Nausea And Vomiting  . Keppra [Levetiracetam] Other (See Comments)    Causes sleepiness, mental status changes    Past Medical History:  Diagnosis Date  . Abnormality of gait 09/22/2015  . Arthritis   . Atrial fibrillation ([place])   . CAD (coronary artery disease)    Stent to RCA, Penta stent, 99% reduced to 0% 2002.  . Cancer ([place])    skin CA removed from back  . Chronic insomnia 03/30/2015  . Complication of anesthesia    trouble waking up  . Constipation    from medications taken  . GERD (gastroesophageal reflux disease)   . Hypercholesteremia   . Hypertension   . Hypothyroidism   . Memory difficulty 09/22/2015  . Osteoarthritis   . Seizures ([place])   . Sepsis ([place]) 05/2017  . Transient alteration of awareness 03/30/2015  . Vertigo    hx of    Social History   Socioeconomic History  . Marital status: Married    Spouse name: [name]  . Number of children: 3  . Years of education: 61  . Highest education level: Not on file  Occupational History  . Occupation: retired Software engineer  Social Needs  . Financial resource strain: Not on file  . Food insecurity:    Worry: Not on file    Inability: Not on file  . Transportation needs:    Medical: Not on file    Non-medical: Not on file  Tobacco Use  . Smoking status: Former Research scientist (life sciences)  . Smokeless tobacco: Never Used  Substance and Sexual Activity  . Alcohol use: Yes    Comment: rare  . Drug use: No  . Sexual activity: Not on file  Lifestyle  . Physical activity:    Days per week: Not on file    Minutes per session: Not on file  . Stress: Not on file  Relationships  . Social connections:    Talks on phone: Not on file    Gets together: Not on file    Attends religious  service: Not on file    Active member of club or organization: Not on file    Attends meetings of clubs or organizations: Not on file    Relationship status: Not on file  . Intimate partner violence:    Fear of current or ex partner: Not on file  Emotionally abused: Not on file    Physically abused: Not on file    Forced sexual activity: Not on file  Other Topics Concern  . Not on file  Social History Narrative   Lives at home w/ his wife [name]   Patient drinks 4-5 cups of coffee daily.   Patient is right handed.      Family History  Problem Relation Age of Onset  . Hypertension Mother   . Cancer Mother   . Kidney failure Father   . Heart disease Father      Review of Systems: General: negative for chills, fever, night sweats or weight changes.  Cardiovascular: negative for chest pain, orthopnea, palpitations, paroxysmal nocturnal dyspnea or shortness of breath Dermatological: negative for rash Respiratory: negative for cough or wheezing Urologic: negative for hematuria Abdominal: negative for nausea, vomiting, diarrhea, bright red blood per rectum, melena, or hematemesis Neurologic: generalized weakness- wheel chair bound, negative for visual changes, syncope, or dizziness All other systems reviewed and are otherwise negative except as noted above.    Blood pressure (!) 94/56, pulse 74, height 5\' 9"  (1.753 m), weight 170 lb 6.4 oz (77.3 kg).  General appearance: alert, cooperative, no distress and in wheel chair Neck: no carotid bruit and no JVD Lungs: clear to auscultation bilaterally Heart: irregularly irregular rhythm Extremities: ecchymosis LLE-now mainly his calf and foot Skin: pale cool dry Neurologic: Grossly normal  EKG AF with CVR  ASSESSMENT AND PLAN:   Hemorrhage Spontaneous hemorrhage LLE on Eliquis- pt stopped and started ASA 81 mg  Chronic atrial fibrillation (HCC) Rate controlled.   CKD (chronic kidney disease) stage 3, GFR 30-59 ml/min  (HCC) Check BMP today  Essential hypertension Slightly hypotensive today-106/60  GERD (gastroesophageal reflux disease) Chronic aspiration  Chronic back pain Pt is wheel chair bound  CAD S/P percutaneous coronary angioplasty Remote PCI in '02. Normal LBVF by echo feb 2019  Chronic diastolic (congestive) heart failure ([place]) Wgts unobtainable as pt can't stand without assistance. His wife measures his calf diameter and adjusts Lasix based n this   PLAN  I agree with stopping Eliquis. He had a significant spontaneous bleed and has widespread ecchymosis on his arms (he is on Prednisone as well for chronic pain). He understands he is at increased risk for stroke. I'll review with Dr Sallyanne Kuster.   Kerin Ransom PA-C 10/22/2017 12:06 PM

## 2017-10-22 NOTE — Assessment & Plan Note (Signed)
Chronic aspiration

## 2017-10-23 ENCOUNTER — Ambulatory Visit: Payer: Self-pay | Admitting: Pharmacist

## 2017-10-24 NOTE — Progress Notes (Signed)
I understand.  We will have to do without anticoagulation. MCr

## 2017-10-28 DIAGNOSIS — L97812 Non-pressure chronic ulcer of other part of right lower leg with fat layer exposed: Secondary | ICD-10-CM | POA: Diagnosis not present

## 2017-10-29 ENCOUNTER — Other Ambulatory Visit: Payer: Self-pay

## 2017-10-29 NOTE — Patient Outreach (Signed)
[place] [facility]) Care Management  10/29/2017  [name] [birth year] [id number]  Referral received 6/18 per Raynaldo Opitz, LCSW: client discharged from skilled facility-[facility] on 6/15.  84 year old with history of HTN, Heart failure,CKD,seizure disorder, CKD, Paroxysmal atrial fibrillation, memory loss, hypothyroid, OSA  RNCM called to follow up. Spoke with client's wife who is also primary caregiver. She reports client's legs bruising is clearing. He has followed up with cardiologist and is still active with physical therapy. Mrs. Valek states client's pharmacy reconciliation has been done. And he continues to go to the wound center for wound management and she states it is healing.  Mrs. Winokur states that client is getting stronger.  RNCM has been providing education regarding atrial fibrillation. Mrs. Lucus is receptive to [employer] for continued education regarding atrial fibrillation and action plan with client.    Plan: transition to health coach.  Thea Silversmith, RN, MSN, [place] Coordinator Cell: [phone]

## 2017-10-30 ENCOUNTER — Ambulatory Visit: Payer: Self-pay | Admitting: Pharmacist

## 2017-11-06 ENCOUNTER — Other Ambulatory Visit: Payer: Self-pay | Admitting: Cardiovascular Disease

## 2017-11-08 ENCOUNTER — Ambulatory Visit (INDEPENDENT_AMBULATORY_CARE_PROVIDER_SITE_OTHER): Payer: Medicare Other | Admitting: Cardiovascular Disease

## 2017-11-08 ENCOUNTER — Encounter: Payer: Self-pay | Admitting: Cardiovascular Disease

## 2017-11-08 VITALS — BP 119/61 | HR 85 | Ht 69.0 in | Wt 170.6 lb

## 2017-11-08 DIAGNOSIS — E7800 Pure hypercholesterolemia, unspecified: Secondary | ICD-10-CM | POA: Diagnosis not present

## 2017-11-08 DIAGNOSIS — I2510 Atherosclerotic heart disease of native coronary artery without angina pectoris: Secondary | ICD-10-CM | POA: Diagnosis not present

## 2017-11-08 DIAGNOSIS — T148XXA Other injury of unspecified body region, initial encounter: Secondary | ICD-10-CM

## 2017-11-08 DIAGNOSIS — I5032 Chronic diastolic (congestive) heart failure: Secondary | ICD-10-CM

## 2017-11-08 DIAGNOSIS — I481 Persistent atrial fibrillation: Secondary | ICD-10-CM | POA: Diagnosis not present

## 2017-11-08 DIAGNOSIS — I4819 Other persistent atrial fibrillation: Secondary | ICD-10-CM

## 2017-11-08 DIAGNOSIS — N183 Chronic kidney disease, stage 3 unspecified: Secondary | ICD-10-CM

## 2017-11-08 DIAGNOSIS — L97529 Non-pressure chronic ulcer of other part of left foot with unspecified severity: Secondary | ICD-10-CM

## 2017-11-08 DIAGNOSIS — D638 Anemia in other chronic diseases classified elsewhere: Secondary | ICD-10-CM

## 2017-11-08 DIAGNOSIS — R29898 Other symptoms and signs involving the musculoskeletal system: Secondary | ICD-10-CM

## 2017-11-08 MED ORDER — SILODOSIN 4 MG PO CAPS: 4.0000 mg | ORAL_CAPSULE | Freq: Every day | ORAL | 5 refills | Status: DC

## 2017-11-08 NOTE — Progress Notes (Signed)
[name]:    Date:  11/10/2017   ID:  [name], DOB 1933/10/17, MRN [id number]  PCP:  Robert Battles, MD  Cardiologist:  Robert Latch, MD   Referring MD: Robert Battles, MD   Chief Complaint  Patient presents with  . Follow-up    CHF, AFib    History of Present Illness:    [name] is a 84 y.o. male with a hx of coronary artery disease (PCI-RCA 4034), diastolic heart failure, difficult to manage due to severe orthostatic hypotension and frequent episodes of acute on chronic renal insufficiency (baseline CKD 3), chronic atrial fibrillation, slow healing chronic right leg wound with bleeding problems.  Anticoagulation with Eliquis was discontinued after he developed a spontaneous bleed in his left leg and dropped his hemoglobin substantially.  He is now taking only aspirin.  He has severe generalized bruising, probably related to chronic use of prednisone.  He is unable to stand on a scale for daily weights.  We are using a rather nonconventional method of adjusting his diuretics based on his calf caliber.  Left calf circumference equivalent for "dry weight" is a calf circumference of 14 inches.  [name] always measures in the same location that is well marked by an old surgical scar.  Normal left ventricular systolic function by his last echo performed in February 2019.  Overall he is feeling well.  He denies dizziness or syncope has not been aware of palpitations does not have orthopnea or PND.  He denies angina.  He remains extremely weak and requires assistance to stand.  He is unable to hold steady on conventional scales.  Past Medical History:  Diagnosis Date  . Abnormality of gait 09/22/2015  . Arthritis   . Atrial fibrillation ([place])   . CAD (coronary artery disease)    Stent to RCA, Penta stent, 99% reduced to 0% 2002.  . Cancer ([place])    skin CA removed from back  . Chronic insomnia 03/30/2015  . Complication of anesthesia    trouble  waking up  . Constipation    from medications taken  . GERD (gastroesophageal reflux disease)   . Hypercholesteremia   . Hypertension   . Hypothyroidism   . Memory difficulty 09/22/2015  . Osteoarthritis   . Seizures ([place])   . Sepsis ([place]) 05/2017  . Transient alteration of awareness 03/30/2015  . Vertigo    hx of    Past Surgical History:  Procedure Laterality Date  . BACK SURGERY    . EYE SURGERY     Bilateral Cataract surgery   . HERNIA REPAIR    . I&D EXTREMITY Right 05/10/2015   Procedure: IRRIGATION AND DEBRIDEMENT EXTREMITY;  Surgeon: Robert Cover, MD;  Location: [facility];  Service: Orthopedics;  Laterality: Right;  . KNEE ARTHROPLASTY     right knee X 2; left knee once  . LAMINECTOMY     X 6  . POSTERIOR CERVICAL FUSION/FORAMINOTOMY  01/28/2012   Procedure: POSTERIOR CERVICAL FUSION/FORAMINOTOMY LEVEL 3;  Surgeon: Robert Spangle, MD;  Location: [facility];  Service: Neurosurgery;  Laterality: Left;  Posterior Cervical Five-Thoracic One Fusion, Arthrodesis with LEFT Cervical Seven-thoracic One Laminectomy, Foraminotomy and Resection of Synovial Cyst  . POSTERIOR CERVICAL FUSION/FORAMINOTOMY N/A 01/29/2013   Procedure: POSTERIOR CERVICAL FUSION/FORAMINOTOMY LEVEL 1 and C2-5 Posteriolateral Arthrodesis;  Surgeon: Robert Spangle, MD;  Location: [facility];  Service: Neurosurgery;  Laterality: N/A;  C2-C3 Laminectomy C2-C3 posterior cervical arthrodesis  . TONSILLECTOMY  Current Medications: Current Meds  Medication Sig  . acetaminophen (TYLENOL) 325 MG tablet Take 650 mg by mouth every 4 (four) hours as needed for mild pain.  Marland Kitchen alendronate (FOSAMAX) 70 MG tablet Take 70 mg by mouth once a week. On Wednesday. Remain upright for 30-60 minutes.  . Amino Acids-Protein Hydrolys (FEEDING SUPPLEMENT, PRO-STAT SUGAR FREE 64,) LIQD Take 30 mLs by mouth 2 (two) times daily.  Marland Kitchen aspirin EC 81 MG tablet Take 1 tablet (81 mg total) by mouth daily.  .  calcium carbonate (OS-CAL) 600 MG tablet Take 600 mg by mouth daily after breakfast.   . collagenase (SANTYL) ointment Apply 1 application topically daily as needed (as instructed by [name].).  Marland Kitchen cyanocobalamin 500 MCG tablet Take 500 mcg by mouth at bedtime.  Marland Kitchen diltiazem (CARDIZEM CD) 300 MG 24 hr capsule Take 300 mg by mouth daily.  . divalproex (DEPAKOTE) 250 MG DR tablet Take 250 mg by mouth every morning.  . divalproex (DEPAKOTE) 250 MG DR tablet Take 750 mg by mouth every evening.  . docusate sodium (COLACE) 100 MG capsule Take 100-200 mg by mouth daily as needed.   . ferrous sulfate 325 (65 FE) MG tablet Take 1 tablet (325 mg total) by mouth 2 (two) times daily with a meal.  . furosemide (LASIX) 40 MG tablet Take 40 mg by mouth every morning. Or per cardiologist instructions since patient cannot be weighed  . HYDROcodone-acetaminophen (NORCO) 10-325 MG tablet Take 1 tablet by mouth every 4 (four) hours as needed for moderate pain.  Marland Kitchen levothyroxine (SYNTHROID, LEVOTHROID) 150 MCG tablet Take 150 mcg by mouth daily before breakfast.  . lidocaine (LIDODERM) 5 % Place 1 patch onto the skin daily as needed. Remove & Discard patch within 12 hours or as directed by MD (Patient taking differently: Place 1 patch onto the skin daily as needed (pain). Remove & Discard patch within 12 hours or as directed by MD )  . metoprolol tartrate (LOPRESSOR) 25 MG tablet Take 1 tablet (25 mg total) by mouth 2 (two) times daily. Take with or immediately following a meal.  . Multiple Vitamins-Minerals (CERTAVITE/ANTIOXIDANTS) TABS Take 1 tablet by mouth every evening.   . nitroGLYCERIN (NITROSTAT) 0.4 MG SL tablet Place 0.4 mg under the tongue every 5 (five) minutes as needed for chest pain. Max of 3 tablets  . omeprazole (PRILOSEC) 20 MG capsule Take 20 mg by mouth daily before breakfast.   . potassium chloride SA (K-DUR,KLOR-CON) 20 MEQ tablet Take 1 tablet (20 mEq total) by mouth daily. Along with lasix  40mg  or as directed by cardiologist.  . pravastatin (PRAVACHOL) 40 MG tablet Take 40 mg by mouth at bedtime.   . predniSONE (DELTASONE) 10 MG tablet Take 1 tablet (10 mg total) by mouth daily with breakfast.  . senna (SENOKOT) 8.6 MG tablet Take 1 tablet by mouth 2 (two) times daily as needed for constipation.   Marland Kitchen VITAMIN D, ERGOCALCIFEROL, PO Take 5,000 Units by mouth daily.   Marland Kitchen ZETIA 10 MG tablet Take 5 mg by mouth at bedtime. (0.5 tablet)  . [DISCONTINUED] doxazosin (CARDURA) 4 MG tablet Take 1 tablet (4 mg total) by mouth every evening.  . [DISCONTINUED] doxazosin (CARDURA) 4 MG tablet Take 1 tablet (4 mg total) by mouth daily.     Allergies:   Eliquis [apixaban]; Demerol [meperidine]; and Keppra [levetiracetam]   Social History   Socioeconomic History  . Marital status: Married    Spouse name: [name]  .  Number of children: 3  . Years of education: 40  . Highest education level: Not on file  Occupational History  . Occupation: retired Software engineer  Social Needs  . Financial resource strain: Not on file  . Food insecurity:    Worry: Not on file    Inability: Not on file  . Transportation needs:    Medical: Not on file    Non-medical: Not on file  Tobacco Use  . Smoking status: Former Research scientist (life sciences)  . Smokeless tobacco: Never Used  Substance and Sexual Activity  . Alcohol use: Yes    Comment: rare  . Drug use: No  . Sexual activity: Not on file  Lifestyle  . Physical activity:    Days per week: Not on file    Minutes per session: Not on file  . Stress: Not on file  Relationships  . Social connections:    Talks on phone: Not on file    Gets together: Not on file    Attends religious service: Not on file    Active member of club or organization: Not on file    Attends meetings of clubs or organizations: Not on file    Relationship status: Not on file  Other Topics Concern  . Not on file  Social History Narrative   Lives at home w/ his wife [name]   Patient drinks 4-5  cups of coffee daily.   Patient is right handed.      Family History: The patient's family history includes Cancer in his mother; Heart disease in his father; Hypertension in his mother; Kidney failure in his father.  ROS:   Please see the history of present illness.    All other systems reviewed and are negative.  EKGs/Labs/Other Studies Reviewed:     EKG:  EKG is not ordered today.   Recent Labs: 07/11/2017: B Natriuretic Peptide 309.9 07/12/2017: TSH 0.452 09/06/2017: ALT 21 09/30/2017: Magnesium 2.1 10/22/2017: BUN 42; Creatinine, Ser 1.48; Hemoglobin 10.3; NT-Pro BNP 3,243; Platelets 134; Potassium 4.3; Sodium 143  Recent Lipid Panel    Component Value Date/Time   CHOL 82 07/12/2017 0624   TRIG 45 07/12/2017 0624   HDL 46 07/12/2017 0624   CHOLHDL 1.8 07/12/2017 0624   VLDL 9 07/12/2017 0624   LDLCALC 27 07/12/2017 0624    Physical Exam:    VS:  BP 119/61   Pulse 85   Ht 5\' 9"  (1.753 m)   Wt 170 lb 9.6 oz (77.4 kg)   BMI 25.19 kg/m     Wt Readings from Last 3 Encounters:  11/08/17 170 lb 9.6 oz (77.4 kg)  10/22/17 170 lb 6.4 oz (77.3 kg)  09/14/17 169 lb (76.7 kg)     GEN: Smiling, well nourished, well developed in no acute distress HEENT: Normal NECK: No JVD; No carotid bruits LYMPHATICS: No lymphadenopathy CARDIAC: Irregular , no murmurs, rubs, gallops RESPIRATORY:  Clear to auscultation without rales, wheezing or rhonchi  ABDOMEN: Soft, non-tender, non-distended MUSCULOSKELETAL:  No edema; No deformity  SKIN: Warm and dry NEUROLOGIC:  Alert and oriented x 3 PSYCHIATRIC:  Normal affect   ASSESSMENT:    1. Chronic diastolic (congestive) heart failure (HCC)   2. Persistent atrial fibrillation ([place])   3. Coronary artery disease involving native coronary artery of native heart without angina pectoris   4. Hypercholesterolemia   5. CKD (chronic kidney disease) stage 3, GFR 30-59 ml/min (HCC)   6. Severe muscle deconditioning   7. Open wound   8. Ulcer  of left foot, unspecified ulcer stage ([place])   9. Anemia of chronic disease    PLAN:    In order of problems listed above:  1. CHF: This has been challenging to manage since we are trying hard to maintain a balance between orthostatic hypotension/renal insufficiency due to hypovolemia versus heart failure.  I think we have achieved a reasonable compromise.  His "dry weight" seems to be around 170 +/-3 lbs pounds, but unfortunately at home we do not have an accurate way of measuring this.  This seems to coincide fairly well with a left calf circumference of 14 inches which is her surrogate method for monitoring.  Doxazosin may be contributing to his tendency to have orthostatic hypotension.  Will switch to a more modern alpha-blocker such as silodosin. 2. AFib: Persistent arrhythmia, likely permanent.  Well rate controlled.  Despite high embolic risk we are unable to anticoagulate due to recurrent serious bleeding complications. 3. CAD: Remote history of percutaneous intervention in 2002 without subsequent events.  Normal left ventricular systolic function.  He does not have angina pectoris.   4. HLP: LDL is very low, on statin. 5. CKD 3: Relatively stable creatinine, recently little higher at 1.48, but not far from what appears to be a baseline of 1.3.  I think we have to perform monthly labs since his kidneys are very sensitive to hypovolemia. 6. Deconditioning: Remains a serious problem, continue working with physical therapy at home. 7. Chronic steroid therapy: For his orthopedic problems.  Makes him prone to sodium retention and easy bleeding.  Need to be aware that he might need stress doses of hydrocortisone during serious illnesses 8. Left foot ulcer, monitored in the wound [name], improving.  Check albumin/prealbumin for healing potential. 9. Chronic anemia: Recheck   Medication Adjustments/Labs and Tests Ordered: Current medicines are reviewed at length with the patient today.  Concerns  regarding medicines are outlined above.  Orders Placed This Encounter  Procedures  . Comprehensive metabolic panel  . CBC  . Pro b natriuretic peptide (BNP)  . Prealbumin   Meds ordered this encounter  Medications  . silodosin (RAPAFLO) 4 MG CAPS capsule    Sig: Take 1 capsule (4 mg total) by mouth daily with breakfast.    Dispense:  30 capsule    Refill:  5    Patient Instructions  Medication Instructions: Dr Sallyanne Kuster recommends that you continue on your current medications as directed. Please refer to the Current Medication list given to you today.  Labwork: Your physician recommends that you return for lab work in 2 weeks.  Testing/Procedures: NONE ORDERED  Follow-up: Your physician recommends that you schedule a follow-up appointment in 2 months with Kerin Ransom, PA.  Dr Sallyanne Kuster recommends that you schedule a follow-up appointment in 4 months.  If you need a refill on your cardiac medications before your next appointment, please call your pharmacy.    Signed, Sanda Klein, MD  11/10/2017 2:59 PM    [place]

## 2017-11-08 NOTE — Patient Instructions (Signed)
Medication Instructions: Dr Sallyanne Kuster recommends that you continue on your current medications as directed. Please refer to the Current Medication list given to you today.  Labwork: Your physician recommends that you return for lab work in 2 weeks.  Testing/Procedures: NONE ORDERED  Follow-up: Your physician recommends that you schedule a follow-up appointment in 2 months with Kerin Ransom, PA.  Dr Sallyanne Kuster recommends that you schedule a follow-up appointment in 4 months.  If you need a refill on your cardiac medications before your next appointment, please call your pharmacy.

## 2017-11-10 DIAGNOSIS — R29898 Other symptoms and signs involving the musculoskeletal system: Secondary | ICD-10-CM | POA: Insufficient documentation

## 2017-11-10 DIAGNOSIS — I4819 Other persistent atrial fibrillation: Secondary | ICD-10-CM | POA: Insufficient documentation

## 2017-11-12 ENCOUNTER — Encounter (HOSPITAL_BASED_OUTPATIENT_CLINIC_OR_DEPARTMENT_OTHER): Payer: Medicare Other | Attending: Internal Medicine

## 2017-11-12 DIAGNOSIS — L950 Livedoid vasculitis: Secondary | ICD-10-CM | POA: Insufficient documentation

## 2017-11-12 DIAGNOSIS — Z7952 Long term (current) use of systemic steroids: Secondary | ICD-10-CM | POA: Insufficient documentation

## 2017-11-12 DIAGNOSIS — I10 Essential (primary) hypertension: Secondary | ICD-10-CM | POA: Insufficient documentation

## 2017-11-12 DIAGNOSIS — I890 Lymphedema, not elsewhere classified: Secondary | ICD-10-CM | POA: Insufficient documentation

## 2017-11-12 DIAGNOSIS — L97812 Non-pressure chronic ulcer of other part of right lower leg with fat layer exposed: Secondary | ICD-10-CM | POA: Diagnosis not present

## 2017-11-12 DIAGNOSIS — G4730 Sleep apnea, unspecified: Secondary | ICD-10-CM | POA: Diagnosis not present

## 2017-11-12 DIAGNOSIS — I2510 Atherosclerotic heart disease of native coronary artery without angina pectoris: Secondary | ICD-10-CM | POA: Diagnosis not present

## 2017-11-12 DIAGNOSIS — G6182 Multifocal motor neuropathy: Secondary | ICD-10-CM | POA: Insufficient documentation

## 2017-11-21 ENCOUNTER — Other Ambulatory Visit: Payer: Self-pay

## 2017-11-21 NOTE — Patient Outreach (Signed)
[name] [facility]) Care Management  11/21/2017  ORAL REMACHE Mar 10, 1934 [id number]    Telephone call placed to the patient for initial assessment. HIPAA verified by his wife [name] (on [place]). Caregiver was given information about [facility] services and declined.  Caregiver states that they feels they do not have any needs at this time.  She did not want to complete the assessment. Caregiver was offered to be sent a pamphlet in the mail and told to call if the have needs in the future.  She states that she appreciated the call.   Plan:  RN Health Coach will close the case at this time due to caregiver declining services.  RN Health Coach will mail the patient a pamphlet of services.  Lazaro Arms RN, BSN, [place] Direct Dial:  [phone]  Fax: [phone]

## 2017-11-23 LAB — COMPREHENSIVE METABOLIC PANEL
ALT: 20 IU/L (ref 0–44)
AST: 22 IU/L (ref 0–40)
Albumin/Globulin Ratio: 2.6 — ABNORMAL HIGH (ref 1.2–2.2)
Albumin: 4.1 g/dL (ref 3.5–4.7)
Alkaline Phosphatase: 56 IU/L (ref 39–117)
BUN/Creatinine Ratio: 29 — ABNORMAL HIGH (ref 10–24)
BUN: 49 mg/dL — ABNORMAL HIGH (ref 8–27)
Bilirubin Total: 0.5 mg/dL (ref 0.0–1.2)
CO2: 25 mmol/L (ref 20–29)
Calcium: 9 mg/dL (ref 8.6–10.2)
Chloride: 100 mmol/L (ref 96–106)
Creatinine, Ser: 1.69 mg/dL — ABNORMAL HIGH (ref 0.76–1.27)
GFR calc Af Amer: 43 mL/min/{1.73_m2} — ABNORMAL LOW (ref >59)
GFR calc non Af Amer: 37 mL/min/{1.73_m2} — ABNORMAL LOW (ref >59)
Globulin, Total: 1.6 g/dL (ref 1.5–4.5)
Glucose: 107 mg/dL — ABNORMAL HIGH (ref 65–99)
Potassium: 4.1 mmol/L (ref 3.5–5.2)
Sodium: 144 mmol/L (ref 134–144)
Total Protein: 5.7 g/dL — ABNORMAL LOW (ref 6.0–8.5)

## 2017-11-23 LAB — CBC
Hematocrit: 37.1 % — ABNORMAL LOW (ref 37.5–51.0)
Hemoglobin: 12.6 g/dL — ABNORMAL LOW (ref 13.0–17.7)
MCH: 31.5 pg (ref 26.6–33.0)
MCHC: 34 g/dL (ref 31.5–35.7)
MCV: 93 fL (ref 79–97)
Platelets: 154 10*3/uL (ref 150–450)
RBC: 4 x10E6/uL — ABNORMAL LOW (ref 4.14–5.80)
RDW: 14.2 % (ref 12.3–15.4)
WBC: 9.4 10*3/uL (ref 3.4–10.8)

## 2017-11-23 LAB — PRO B NATRIURETIC PEPTIDE: NT-Pro BNP: 3962 pg/mL — ABNORMAL HIGH (ref 0–486)

## 2017-11-23 LAB — PREALBUMIN: PREALBUMIN: 36 mg/dL — ABNORMAL HIGH (ref 9–32)

## 2017-11-26 DIAGNOSIS — L97812 Non-pressure chronic ulcer of other part of right lower leg with fat layer exposed: Secondary | ICD-10-CM | POA: Diagnosis not present

## 2017-11-28 ENCOUNTER — Other Ambulatory Visit: Payer: Self-pay | Admitting: Cardiovascular Disease

## 2017-11-28 ENCOUNTER — Telehealth: Payer: Self-pay | Admitting: Cardiovascular Disease

## 2017-11-28 MED ORDER — FUROSEMIDE 40 MG PO TABS: 40.0000 mg | ORAL_TABLET | ORAL | 1 refills | Status: DC

## 2017-11-28 NOTE — Telephone Encounter (Signed)
New Message    *STAT* If patient is at the pharmacy, call can be transferred to refill team.   1. Which medications need to be refilled? (please list name of each medication and dose if known) furosemide (LASIX) 40 MG tablet  2. Which pharmacy/location (including street and city if local pharmacy) is medication to be sent to? [facility] [place], [place] AT [address]  3. Do they need a 30 day or 90 day supply? 90 day supply

## 2017-12-03 ENCOUNTER — Other Ambulatory Visit: Payer: Self-pay | Admitting: Cardiovascular Disease

## 2017-12-05 DIAGNOSIS — L97812 Non-pressure chronic ulcer of other part of right lower leg with fat layer exposed: Secondary | ICD-10-CM | POA: Diagnosis not present

## 2017-12-12 ENCOUNTER — Encounter (HOSPITAL_BASED_OUTPATIENT_CLINIC_OR_DEPARTMENT_OTHER): Payer: Medicare Other | Attending: Internal Medicine

## 2017-12-12 DIAGNOSIS — I2510 Atherosclerotic heart disease of native coronary artery without angina pectoris: Secondary | ICD-10-CM | POA: Diagnosis not present

## 2017-12-12 DIAGNOSIS — L98499 Non-pressure chronic ulcer of skin of other sites with unspecified severity: Secondary | ICD-10-CM | POA: Insufficient documentation

## 2017-12-12 DIAGNOSIS — I10 Essential (primary) hypertension: Secondary | ICD-10-CM | POA: Diagnosis not present

## 2017-12-12 DIAGNOSIS — L97815 Non-pressure chronic ulcer of other part of right lower leg with muscle involvement without evidence of necrosis: Secondary | ICD-10-CM | POA: Diagnosis not present

## 2017-12-12 DIAGNOSIS — G4730 Sleep apnea, unspecified: Secondary | ICD-10-CM | POA: Insufficient documentation

## 2017-12-19 DIAGNOSIS — L97815 Non-pressure chronic ulcer of other part of right lower leg with muscle involvement without evidence of necrosis: Secondary | ICD-10-CM | POA: Diagnosis not present

## 2017-12-23 ENCOUNTER — Ambulatory Visit (HOSPITAL_COMMUNITY)
Admission: RE | Admit: 2017-12-23 | Discharge: 2017-12-23 | Disposition: A | Discharge status: Home / Self Care | Payer: Medicare Other | Source: Ambulatory Visit | Attending: Family | Admitting: Family

## 2017-12-23 ENCOUNTER — Ambulatory Visit (INDEPENDENT_AMBULATORY_CARE_PROVIDER_SITE_OTHER)
Admission: RE | Admit: 2017-12-23 | Discharge: 2017-12-23 | Disposition: A | Discharge status: Home / Self Care | Payer: Medicare Other | Source: Ambulatory Visit | Attending: Family | Admitting: Family

## 2017-12-23 ENCOUNTER — Other Ambulatory Visit (HOSPITAL_BASED_OUTPATIENT_CLINIC_OR_DEPARTMENT_OTHER): Payer: Self-pay | Admitting: Internal Medicine

## 2017-12-23 DIAGNOSIS — L97919 Non-pressure chronic ulcer of unspecified part of right lower leg with unspecified severity: Secondary | ICD-10-CM

## 2018-01-03 DIAGNOSIS — L97815 Non-pressure chronic ulcer of other part of right lower leg with muscle involvement without evidence of necrosis: Secondary | ICD-10-CM | POA: Diagnosis not present

## 2018-01-10 ENCOUNTER — Ambulatory Visit (INDEPENDENT_AMBULATORY_CARE_PROVIDER_SITE_OTHER): Payer: Medicare Other | Admitting: Cardiology

## 2018-01-10 ENCOUNTER — Encounter: Payer: Self-pay | Admitting: Cardiology

## 2018-01-10 VITALS — BP 128/72 | HR 74 | Resp 16 | Ht 69.0 in | Wt 165.4 lb

## 2018-01-10 DIAGNOSIS — N183 Chronic kidney disease, stage 3 unspecified: Secondary | ICD-10-CM

## 2018-01-10 DIAGNOSIS — E46 Unspecified protein-calorie malnutrition: Secondary | ICD-10-CM

## 2018-01-10 DIAGNOSIS — I2510 Atherosclerotic heart disease of native coronary artery without angina pectoris: Secondary | ICD-10-CM

## 2018-01-10 NOTE — Patient Instructions (Signed)
Medication Instructions:  Your physician recommends that you continue on your current medications as directed. Please refer to the Current Medication list given to you today.  If you need a refill on your cardiac medications before your next appointment, please call your pharmacy.   Lab work: Your physician recommends that you return for lab work in: TODAY-BMET, [place] If you have labs (blood work) drawn today and your tests are completely normal, you will receive your results only by: Marland Kitchen MyChart Message (if you have MyChart) OR . A paper copy in the mail If you have any lab test that is abnormal or we need to change your treatment, we will call you to review the results.  Testing/Procedures: NONE  Follow-Up: At [facility], you and your health needs are our priority.  As part of our continuing mission to provide you with exceptional heart care, we have created designated Provider Care Teams.  These Care Teams include your primary Cardiologist (physician) and Advanced Practice Providers (APPs -  Physician Assistants and Nurse Practitioners) who all work together to provide you with the care you need, when you need it. . FOLLOW UP AS SCHEDULED  Any Other Special Instructions Will Be Listed Below (If Applicable).

## 2018-01-10 NOTE — Progress Notes (Signed)
Thanks, [employer]

## 2018-01-10 NOTE — Progress Notes (Signed)
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 He had been on Eliquis w/ CHA2DS2-VASc = 5 (age x 2, CAD, HTN, CHF). His wife follows his calf diameter to determine his Lasix dose since he can't stand for wgts. The pt had a significant spontaneous bleed in his Lt leg and had to come off Eliquis.  He is in the office today for a routine follow up.  Dr Sallyanne Kuster has suggested they give him extra lasix if his calf diameter is > 14 inches. His wife tells me this has been stable, usually running less than this. He denies SOB or chest pain.    Current Outpatient Medications  Medication Sig Dispense Refill  . acetaminophen (TYLENOL) 325 MG tablet Take 650 mg by mouth every 4 (four) hours as needed for mild pain.    Marland Kitchen alendronate (FOSAMAX) 70 MG tablet Take 70 mg by mouth once a week. On Wednesday. Remain upright for 30-60 minutes.    . Amino Acids-Protein Hydrolys (FEEDING SUPPLEMENT, PRO-STAT SUGAR FREE 64,) LIQD Take 30 mLs by mouth 2 (two) times daily. 900 mL 0  . aspirin EC 81 MG tablet Take 1 tablet (81 mg total) by mouth daily. 90 tablet 3  . calcium carbonate (OS-CAL) 600 MG tablet Take 600 mg by mouth daily after breakfast.     . collagenase (SANTYL) ointment Apply 1 application topically daily as needed (as instructed by [facility].).    Marland Kitchen cyanocobalamin 500 MCG tablet Take 500 mcg by mouth at bedtime.    Marland Kitchen diltiazem (CARDIZEM CD) 300 MG 24 hr capsule TAKE 1 CAPSULE(300 MG) BY MOUTH EVERY MORNING. DO NOT CRUSH 90 capsule 3  . divalproex (DEPAKOTE) 250 MG DR tablet Take 250 mg by mouth every morning.    . divalproex (DEPAKOTE) 250 MG DR tablet Take 750 mg by mouth every evening.      . docusate sodium (COLACE) 100 MG capsule Take 100-200 mg by mouth daily as needed.     . ferrous sulfate 325 (65 FE) MG tablet Take 1 tablet (325 mg total) by mouth 2 (two) times daily with a meal. 60 tablet 0  . furosemide (LASIX) 40 MG tablet Take 1 tablet (40 mg total) by mouth every morning. Or per cardiologist instructions since patient cannot be weighed 90 tablet 1  . HYDROcodone-acetaminophen (NORCO) 10-325 MG tablet Take 1 tablet by mouth every 4 (four) hours as needed for moderate pain.    Marland Kitchen levothyroxine (SYNTHROID, LEVOTHROID) 150 MCG tablet Take 150 mcg by mouth daily before breakfast.    . lidocaine (LIDODERM) 5 % Place 1 patch onto the skin daily as needed. Remove & Discard patch within 12 hours or as directed by MD (Patient taking differently: Place 1 patch onto the skin daily as needed (pain). Remove & Discard patch within 12 hours or as directed by MD ) 30 patch 0  . metoprolol tartrate (LOPRESSOR) 25 MG tablet Take 1 tablet (25 mg total) by mouth 2 (two) times daily. Take with or immediately following a meal. 180 tablet 3  . Multiple Vitamins-Minerals (CERTAVITE/ANTIOXIDANTS) TABS Take 1 tablet by mouth every evening.     . nitroGLYCERIN (NITROSTAT) 0.4 MG SL tablet Place 0.4 mg under  the tongue every 5 (five) minutes as needed for chest pain. Max of 3 tablets    . omeprazole (PRILOSEC) 20 MG capsule Take 20 mg by mouth daily before breakfast.     . potassium chloride SA (K-DUR,KLOR-CON) 20 MEQ tablet Take 1 tablet (20 mEq total) by mouth daily. Along with lasix 40mg  or as directed by cardiologist. 30 tablet 3  . pravastatin (PRAVACHOL) 40 MG tablet Take 40 mg by mouth at bedtime.   2  . predniSONE (DELTASONE) 10 MG tablet Take 1 tablet (10 mg total) by mouth daily with breakfast.    . senna (SENOKOT) 8.6 MG tablet Take 1 tablet by mouth 2 (two) times daily as needed for constipation.     . silodosin (RAPAFLO) 4 MG CAPS capsule Take 1 capsule (4 mg total) by mouth daily with  breakfast. 30 capsule 5  . VITAMIN D, ERGOCALCIFEROL, PO Take 5,000 Units by mouth daily.     Marland Kitchen ZETIA 10 MG tablet Take 5 mg by mouth at bedtime. (0.5 tablet)     No current facility-administered medications for this visit.     Allergies  Allergen Reactions  . Eliquis [Apixaban] Other (See Comments)    bleeding  . Demerol [Meperidine] Nausea And Vomiting  . Keppra [Levetiracetam] Other (See Comments)    Causes sleepiness, mental status changes    Past Medical History:  Diagnosis Date  . Abnormality of gait 09/22/2015  . Arthritis   . Atrial fibrillation ([place])   . CAD (coronary artery disease)    Stent to RCA, Penta stent, 99% reduced to 0% 2002.  . Cancer ([place])    skin CA removed from back  . Chronic insomnia 03/30/2015  . Complication of anesthesia    trouble waking up  . Constipation    from medications taken  . GERD (gastroesophageal reflux disease)   . Hypercholesteremia   . Hypertension   . Hypothyroidism   . Memory difficulty 09/22/2015  . Osteoarthritis   . Seizures ([place])   . Sepsis ([place]) 05/2017  . Transient alteration of awareness 03/30/2015  . Vertigo    hx of    Social History   Socioeconomic History  . Marital status: Married    Spouse name: [name]  . Number of children: 3  . Years of education: 52  . Highest education level: Not on file  Occupational History  . Occupation: retired Software engineer  Social Needs  . Financial resource strain: Not on file  . Food insecurity:    Worry: Not on file    Inability: Not on file  . Transportation needs:    Medical: Not on file    Non-medical: Not on file  Tobacco Use  . Smoking status: Former Research scientist (life sciences)  . Smokeless tobacco: Never Used  Substance and Sexual Activity  . Alcohol use: Yes    Comment: rare  . Drug use: No  . Sexual activity: Not on file  Lifestyle  . Physical activity:    Days per week: Not on file    Minutes per session: Not on file  . Stress: Not on file  Relationships  . Social  connections:    Talks on phone: Not on file    Gets together: Not on file    Attends religious service: Not on file    Active member of club or organization: Not on file    Attends meetings of clubs or organizations: Not on file    Relationship status: Not on file  . Intimate partner  violence:    Fear of current or ex partner: Not on file    Emotionally abused: Not on file    Physically abused: Not on file    Forced sexual activity: Not on file  Other Topics Concern  . Not on file  Social History Narrative   Lives at home w/ his wife [name]   Patient drinks 4-5 cups of coffee daily.   Patient is right handed.      Family History  Problem Relation Age of Onset  . Hypertension Mother   . Cancer Mother   . Kidney failure Father   . Heart disease Father      Review of Systems: General: negative for chills, fever, night sweats or weight changes.  Cardiovascular: negative for chest pain, dyspnea on exertion, edema, orthopnea, palpitations, paroxysmal nocturnal dyspnea or shortness of breath Dermatological: negative for rash Respiratory: negative for cough or wheezing Urologic: negative for hematuria Abdominal: negative for nausea, vomiting, diarrhea, bright red blood per rectum, melena, or hematemesis Neurologic: negative for visual changes, syncope, or dizziness All other systems reviewed and are otherwise negative except as noted above.    Blood pressure 128/72, pulse 74, resp. rate 16, height 5\' 9"  (1.753 m), weight 165 lb 6.4 oz (75 kg), SpO2 100 %.  General appearance: alert, cooperative, no distress and in wheel chair Neck: no carotid bruit and no JVD Lungs: decreased breath sounds Lt base Heart: irregularly irregular rhythm with controlled rate Extremities: RLE dressed Neurologic: Grossly normal   ASSESSMENT AND PLAN:   Chronic diastolic (congestive) heart failure (HCC) Wgts unobtainable as pt can't stand without assistance. His wife measures his calf diameter  and adjusts Lasix based n this   Chronic atrial fibrillation (HCC) Rate controlled. Unable to tolerate anticoagulation secondary to bleeding complications.  CKD (chronic kidney disease) stage 3, GFR 30-59 ml/min (HCC) Check BMP today  Essential hypertension Controlled- doxazosin changed to silodosin last office visit  GERD (gastroesophageal reflux disease) Chronic aspiration  Chronic back pain Pt is wheel chair bound  CAD S/P percutaneous coronary angioplasty Remote PCI in '02. Normal LBVF by echo feb 2019  Rt LE wound Followed at wound center. Recent ABIs did not suggest arterial insufficiency.     PLAN  Same Rx- we discussed low sodium diet at length. She asked if his Protein level could be checked secondary to slow would healing of his Rt leg wound and I ordered that as well as a BMP (there was a bump in his SCr in Sept). Dr Trula Slade recently did LEA dopplers which did not demonstrate significant arterial insufficiency.  F/U Dr Sallyanne Kuster in Jan as scheduled.   Kerin Ransom PA-C 01/10/2018 3:14 PM

## 2018-01-11 LAB — BASIC METABOLIC PANEL
BUN/Creatinine Ratio: 35 — ABNORMAL HIGH (ref 10–24)
BUN: 47 mg/dL — ABNORMAL HIGH (ref 8–27)
CO2: 25 mmol/L (ref 20–29)
Calcium: 8.8 mg/dL (ref 8.6–10.2)
Chloride: 103 mmol/L (ref 96–106)
Creatinine, Ser: 1.36 mg/dL — ABNORMAL HIGH (ref 0.76–1.27)
GFR calc Af Amer: 56 mL/min/{1.73_m2} — ABNORMAL LOW (ref >59)
GFR calc non Af Amer: 48 mL/min/{1.73_m2} — ABNORMAL LOW (ref >59)
Glucose: 141 mg/dL — ABNORMAL HIGH (ref 65–99)
Potassium: 5.1 mmol/L (ref 3.5–5.2)
Sodium: 143 mmol/L (ref 134–144)

## 2018-01-11 LAB — PROTEIN, TOTAL: Total Protein: 5.7 g/dL — ABNORMAL LOW (ref 6.0–8.5)

## 2018-01-17 ENCOUNTER — Encounter (HOSPITAL_BASED_OUTPATIENT_CLINIC_OR_DEPARTMENT_OTHER): Payer: Medicare Other | Attending: Internal Medicine

## 2018-01-17 DIAGNOSIS — L950 Livedoid vasculitis: Secondary | ICD-10-CM | POA: Insufficient documentation

## 2018-01-17 DIAGNOSIS — L97812 Non-pressure chronic ulcer of other part of right lower leg with fat layer exposed: Secondary | ICD-10-CM | POA: Diagnosis present

## 2018-01-17 DIAGNOSIS — L97522 Non-pressure chronic ulcer of other part of left foot with fat layer exposed: Secondary | ICD-10-CM | POA: Insufficient documentation

## 2018-01-17 DIAGNOSIS — L97819 Non-pressure chronic ulcer of other part of right lower leg with unspecified severity: Secondary | ICD-10-CM | POA: Diagnosis not present

## 2018-01-17 DIAGNOSIS — G4730 Sleep apnea, unspecified: Secondary | ICD-10-CM | POA: Insufficient documentation

## 2018-01-17 DIAGNOSIS — X58XXXA Exposure to other specified factors, initial encounter: Secondary | ICD-10-CM | POA: Insufficient documentation

## 2018-01-17 DIAGNOSIS — G6182 Multifocal motor neuropathy: Secondary | ICD-10-CM | POA: Insufficient documentation

## 2018-01-17 DIAGNOSIS — Z7952 Long term (current) use of systemic steroids: Secondary | ICD-10-CM | POA: Diagnosis not present

## 2018-01-17 DIAGNOSIS — I10 Essential (primary) hypertension: Secondary | ICD-10-CM | POA: Diagnosis not present

## 2018-01-17 DIAGNOSIS — I2510 Atherosclerotic heart disease of native coronary artery without angina pectoris: Secondary | ICD-10-CM | POA: Diagnosis not present

## 2018-01-17 DIAGNOSIS — S51811A Laceration without foreign body of right forearm, initial encounter: Secondary | ICD-10-CM | POA: Insufficient documentation

## 2018-01-31 DIAGNOSIS — L97812 Non-pressure chronic ulcer of other part of right lower leg with fat layer exposed: Secondary | ICD-10-CM | POA: Diagnosis not present

## 2018-02-14 ENCOUNTER — Encounter (HOSPITAL_BASED_OUTPATIENT_CLINIC_OR_DEPARTMENT_OTHER): Payer: Medicare Other | Attending: Internal Medicine

## 2018-02-14 DIAGNOSIS — L97522 Non-pressure chronic ulcer of other part of left foot with fat layer exposed: Secondary | ICD-10-CM | POA: Diagnosis not present

## 2018-02-14 DIAGNOSIS — L950 Livedoid vasculitis: Secondary | ICD-10-CM | POA: Diagnosis not present

## 2018-02-14 DIAGNOSIS — Z7952 Long term (current) use of systemic steroids: Secondary | ICD-10-CM | POA: Insufficient documentation

## 2018-02-14 DIAGNOSIS — G4730 Sleep apnea, unspecified: Secondary | ICD-10-CM | POA: Diagnosis not present

## 2018-02-14 DIAGNOSIS — L97215 Non-pressure chronic ulcer of right calf with muscle involvement without evidence of necrosis: Secondary | ICD-10-CM | POA: Insufficient documentation

## 2018-02-14 DIAGNOSIS — I10 Essential (primary) hypertension: Secondary | ICD-10-CM | POA: Insufficient documentation

## 2018-02-14 DIAGNOSIS — I2510 Atherosclerotic heart disease of native coronary artery without angina pectoris: Secondary | ICD-10-CM | POA: Diagnosis not present

## 2018-02-21 DIAGNOSIS — L97215 Non-pressure chronic ulcer of right calf with muscle involvement without evidence of necrosis: Secondary | ICD-10-CM | POA: Diagnosis not present

## 2018-02-25 ENCOUNTER — Telehealth: Payer: Self-pay | Admitting: Cardiology

## 2018-02-25 NOTE — Telephone Encounter (Signed)
New Message   Pt's wife is calling stats the pt's insurance will not cover one of his test unless they are contacted by the ordering provider

## 2018-02-25 NOTE — Telephone Encounter (Signed)
Spoke with wife and she stated that insurance is not covering the prealbumin level lab done in September. I did review with her the reason why Dr Sallyanne Kuster did lab   Left foot ulcer, monitored in the wound clinic, improving.  Check albumin/prealbumin for healing potential  She would like to discuss with someone in billing. Advised patient would have billing department call her back but it will not be today

## 2018-02-27 NOTE — Telephone Encounter (Signed)
Spoke with wife and she stated that she just needed reason for Dr Sallyanne Kuster doing prealbumin level for [employer]. Printed copy of Dr [employer] office note and mailed to wife.

## 2018-02-27 NOTE — Telephone Encounter (Signed)
Follow up     Pt wife is calling to speak to nurse about lab not being covered by insurance.

## 2018-02-28 ENCOUNTER — Other Ambulatory Visit: Payer: Self-pay

## 2018-02-28 DIAGNOSIS — L98499 Non-pressure chronic ulcer of skin of other sites with unspecified severity: Secondary | ICD-10-CM

## 2018-03-06 ENCOUNTER — Other Ambulatory Visit: Payer: Self-pay | Admitting: Cardiovascular Disease

## 2018-03-07 DIAGNOSIS — L97215 Non-pressure chronic ulcer of right calf with muscle involvement without evidence of necrosis: Secondary | ICD-10-CM | POA: Diagnosis not present

## 2018-03-10 ENCOUNTER — Telehealth: Payer: Self-pay | Admitting: Cardiovascular Disease

## 2018-03-10 NOTE — Telephone Encounter (Signed)
New Message     *STAT* If patient is at the pharmacy, call can be transferred to refill team.   1. Which medications need to be refilled? (please list name of each medication and dose if known) Potassium Chloride 20 MEG ER  2. Which pharmacy/location (including street and city if local pharmacy) is medication to be sent to? [facility] on [address]  3. Do they need a 30 day or 90 day supply? 90 day supply    (Patient is completely out of the medication and needs it for tomorrow)

## 2018-03-11 ENCOUNTER — Telehealth: Payer: Self-pay

## 2018-03-11 MED ORDER — POTASSIUM CHLORIDE CRYS ER 20 MEQ PO TBCR: EXTENDED_RELEASE_TABLET | ORAL | 3 refills | Status: DC

## 2018-03-11 NOTE — Telephone Encounter (Signed)
Received call from patient requesting 90 day refill for potassium.Refill sent to pharmacy.

## 2018-03-13 ENCOUNTER — Emergency Department (HOSPITAL_COMMUNITY): Payer: Medicare Other

## 2018-03-13 ENCOUNTER — Other Ambulatory Visit: Payer: Self-pay

## 2018-03-13 ENCOUNTER — Emergency Department (HOSPITAL_COMMUNITY)
Admission: EM | Admit: 2018-03-13 | Discharge: 2018-03-13 | Disposition: A | Discharge status: Home / Self Care | Payer: Medicare Other | Source: Home / Self Care | Attending: Emergency Medicine | Admitting: Emergency Medicine

## 2018-03-13 ENCOUNTER — Encounter (HOSPITAL_COMMUNITY): Payer: Self-pay

## 2018-03-13 DIAGNOSIS — L03115 Cellulitis of right lower limb: Secondary | ICD-10-CM | POA: Insufficient documentation

## 2018-03-13 DIAGNOSIS — L03119 Cellulitis of unspecified part of limb: Secondary | ICD-10-CM

## 2018-03-13 DIAGNOSIS — E039 Hypothyroidism, unspecified: Secondary | ICD-10-CM

## 2018-03-13 DIAGNOSIS — Z87891 Personal history of nicotine dependence: Secondary | ICD-10-CM | POA: Insufficient documentation

## 2018-03-13 DIAGNOSIS — A419 Sepsis, unspecified organism: Secondary | ICD-10-CM | POA: Diagnosis not present

## 2018-03-13 DIAGNOSIS — N183 Chronic kidney disease, stage 3 (moderate): Secondary | ICD-10-CM | POA: Insufficient documentation

## 2018-03-13 DIAGNOSIS — Z7982 Long term (current) use of aspirin: Secondary | ICD-10-CM

## 2018-03-13 DIAGNOSIS — Z79899 Other long term (current) drug therapy: Secondary | ICD-10-CM

## 2018-03-13 DIAGNOSIS — I130 Hypertensive heart and chronic kidney disease with heart failure and stage 1 through stage 4 chronic kidney disease, or unspecified chronic kidney disease: Secondary | ICD-10-CM | POA: Insufficient documentation

## 2018-03-13 DIAGNOSIS — I5032 Chronic diastolic (congestive) heart failure: Secondary | ICD-10-CM | POA: Insufficient documentation

## 2018-03-13 DIAGNOSIS — R410 Disorientation, unspecified: Secondary | ICD-10-CM | POA: Diagnosis not present

## 2018-03-13 LAB — CBC WITH DIFFERENTIAL/PLATELET
Abs Immature Granulocytes: 0.1 10*3/uL — ABNORMAL HIGH (ref 0.00–0.07)
Basophils Absolute: 0 10*3/uL (ref 0.0–0.1)
Basophils Relative: 0 %
Eosinophils Absolute: 0.1 10*3/uL (ref 0.0–0.5)
Eosinophils Relative: 1 %
HCT: 37.3 % — ABNORMAL LOW (ref 39.0–52.0)
Hemoglobin: 11.6 g/dL — ABNORMAL LOW (ref 13.0–17.0)
Immature Granulocytes: 1 %
Lymphocytes Relative: 6 %
Lymphs Abs: 0.7 10*3/uL (ref 0.7–4.0)
MCH: 32.2 pg (ref 26.0–34.0)
MCHC: 31.1 g/dL (ref 30.0–36.0)
MCV: 103.6 fL — ABNORMAL HIGH (ref 80.0–100.0)
Monocytes Absolute: 1.5 10*3/uL — ABNORMAL HIGH (ref 0.1–1.0)
Monocytes Relative: 14 %
Neutro Abs: 8.5 10*3/uL — ABNORMAL HIGH (ref 1.7–7.7)
Neutrophils Relative %: 78 %
Platelets: 134 10*3/uL — ABNORMAL LOW (ref 150–400)
RBC: 3.6 MIL/uL — ABNORMAL LOW (ref 4.22–5.81)
RDW: 14.6 % (ref 11.5–15.5)
WBC: 10.8 10*3/uL — ABNORMAL HIGH (ref 4.0–10.5)
nRBC: 0 % (ref 0.0–0.2)

## 2018-03-13 LAB — I-STAT CG4 LACTIC ACID, ED: Lactic Acid, Venous: 1.57 mmol/L (ref 0.5–1.9)

## 2018-03-13 LAB — URINALYSIS, ROUTINE W REFLEX MICROSCOPIC
Bilirubin Urine: NEGATIVE
Glucose, UA: NEGATIVE mg/dL
Hgb urine dipstick: NEGATIVE
Ketones, ur: NEGATIVE mg/dL
Leukocytes, UA: NEGATIVE
Nitrite: NEGATIVE
Protein, ur: NEGATIVE mg/dL
Specific Gravity, Urine: 1.015 (ref 1.005–1.030)
pH: 6 (ref 5.0–8.0)

## 2018-03-13 LAB — COMPREHENSIVE METABOLIC PANEL
ALT: 16 U/L (ref 0–44)
AST: 19 U/L (ref 15–41)
Albumin: 4 g/dL (ref 3.5–5.0)
Alkaline Phosphatase: 45 U/L (ref 38–126)
Anion gap: 11 (ref 5–15)
BUN: 58 mg/dL — ABNORMAL HIGH (ref 8–23)
CO2: 28 mmol/L (ref 22–32)
Calcium: 8.8 mg/dL — ABNORMAL LOW (ref 8.9–10.3)
Chloride: 105 mmol/L (ref 98–111)
Creatinine, Ser: 1.56 mg/dL — ABNORMAL HIGH (ref 0.61–1.24)
GFR calc Af Amer: 47 mL/min — ABNORMAL LOW (ref >60)
GFR calc non Af Amer: 41 mL/min — ABNORMAL LOW (ref >60)
Glucose, Bld: 101 mg/dL — ABNORMAL HIGH (ref 70–99)
Potassium: 3.8 mmol/L (ref 3.5–5.1)
Sodium: 144 mmol/L (ref 135–145)
Total Bilirubin: 0.7 mg/dL (ref 0.3–1.2)
Total Protein: 6.3 g/dL — ABNORMAL LOW (ref 6.5–8.1)

## 2018-03-13 LAB — PROTIME-INR
INR: 1.02
Prothrombin Time: 13.3 seconds (ref 11.4–15.2)

## 2018-03-13 MED ORDER — SODIUM CHLORIDE 0.9 % IV BOLUS (SEPSIS)
1000.0000 mL | Freq: Once | INTRAVENOUS | Status: AC
  Administered 2018-03-13: 1000 mL via INTRAVENOUS

## 2018-03-13 MED ORDER — KETOROLAC TROMETHAMINE 30 MG/ML IJ SOLN
15.0000 mg | Freq: Once | INTRAMUSCULAR | Status: AC
  Administered 2018-03-13: 15 mg via INTRAVENOUS
  Filled 2018-03-13: qty 1

## 2018-03-13 MED ORDER — AMOXICILLIN-POT CLAVULANATE 875-125 MG PO TABS: 1.0000 | ORAL_TABLET | Freq: Two times a day (BID) | ORAL | 0 refills | Status: DC

## 2018-03-13 MED ORDER — SODIUM CHLORIDE 0.9 % IV SOLN
2.0000 g | INTRAVENOUS | Status: DC
  Administered 2018-03-13: 2 g via INTRAVENOUS
  Filled 2018-03-13: qty 20

## 2018-03-13 MED ORDER — SODIUM CHLORIDE 0.9 % IV BOLUS (SEPSIS)
250.0000 mL | Freq: Once | INTRAVENOUS | Status: AC
  Administered 2018-03-13: 250 mL via INTRAVENOUS

## 2018-03-13 NOTE — ED Triage Notes (Addendum)
Patient BIB EMS from home with complaints of RLE redness, edema, and pain. Patient reports history of wounds to that leg that wont heal. Patient sees wound care for his leg. Patient reports he usually experiences numbness to the RLE, but today, he is experiencing pain. Patient has history of Afib- HR for EMS was 110-130.

## 2018-03-13 NOTE — Discharge Instructions (Addendum)
Elevate the right foot above your heart is much as possible.  Use some heat on it, dry or wet, help the discomfort and improve healing.  We sent a prescription to your pharmacy.  Start taking it by tomorrow morning.  Return here, if needed, for problems.

## 2018-03-13 NOTE — ED Provider Notes (Signed)
[facility] DEPT Provider Note   CSN: [id number] Arrival date & time: 03/13/18  2440     History   Chief Complaint Chief Complaint  Patient presents with  . Leg Pain    HPI [name] is a 85 y.o. male.  HPI   He is here for evaluation of painful right foot, associated with a wound of his right ankle.  Ankle wound present for several months being treated by wound management as well as at home nursing wound assessment and dressing changes every week.  His wife is concerned that he has had a fever over the last 24 hours.  No recent sick, vomiting, chest pain, cough, abdominal or back pain.  He has not been vomiting.  He is taking his usual medicines but did not take anything this morning.  There are no other known modifying factors.  Past Medical History:  Diagnosis Date  . Abnormality of gait 09/22/2015  . Arthritis   . Atrial fibrillation ([place])   . CAD (coronary artery disease)    Stent to RCA, Penta stent, 99% reduced to 0% 2002.  . Cancer ([place])    skin CA removed from back  . Chronic insomnia 03/30/2015  . Complication of anesthesia    trouble waking up  . Constipation    from medications taken  . GERD (gastroesophageal reflux disease)   . Hypercholesteremia   . Hypertension   . Hypothyroidism   . Memory difficulty 09/22/2015  . Osteoarthritis   . Seizures ([place])   . Sepsis ([place]) 05/2017  . Transient alteration of awareness 03/30/2015  . Vertigo    hx of    Patient Active Problem List   Diagnosis Date Noted  . Persistent atrial fibrillation 11/10/2017  . Coronary artery disease involving native coronary artery of native heart without angina pectoris 11/10/2017  . Severe muscle deconditioning 11/10/2017  . Hemorrhage 10/22/2017  . CAD S/P percutaneous coronary angioplasty 10/22/2017  . Acute hypoxemic respiratory failure ([place]) 07/17/2017  . Aspiration syndrome, subsequent encounter 07/11/2017  . Aspiration pneumonia ([place])  07/01/2017  . Acute encephalopathy 07/01/2017  . Thrombocytopenia ([place]) 07/01/2017  . Chronic diastolic (congestive) heart failure ([place]) 07/01/2017  . Anemia of chronic disease 07/01/2017  . Troponin level elevated 07/01/2017  . Chronic atrial fibrillation 07/01/2017  . Goals of care, counseling/discussion   . Palliative care by specialist   . Acute metabolic encephalopathy 01/06/2535  . CKD (chronic kidney disease) stage 3, GFR 30-59 ml/min (HCC)   . CAP (community acquired pneumonia) 04/17/2017  . Atrial fibrillation with RVR ([place]) 04/17/2017  . Hallux rigidus, right foot 03/10/2016  . Lumbosacral spondylosis without myelopathy 10/29/2015  . Memory difficulty 09/22/2015  . Abnormality of gait 09/22/2015  . Hyperglycemia 07/06/2015  . Chronic pain 07/06/2015  . Chronic insomnia 03/30/2015  . Transient alteration of awareness 03/30/2015  . Abnormal liver function   . Altered mental status 01/31/2015  . Essential hypertension 01/31/2015  . Constipation 01/31/2015  . Hypothyroidism 01/31/2015  . Seizure disorder ([place]) 01/31/2015  . Bladder outlet obstruction 01/31/2015  . GERD (gastroesophageal reflux disease) 01/31/2015  . Chronic back pain 01/31/2015  . Acute kidney injury ([place]) 01/31/2015    Past Surgical History:  Procedure Laterality Date  . BACK SURGERY    . EYE SURGERY     Bilateral Cataract surgery   . HERNIA REPAIR    . I&D EXTREMITY Right 05/10/2015   Procedure: IRRIGATION AND DEBRIDEMENT EXTREMITY;  Surgeon: Leanora Cover, MD;  Location:  [facility];  Service: Orthopedics;  Laterality: Right;  . KNEE ARTHROPLASTY     right knee X 2; left knee once  . LAMINECTOMY     X 6  . POSTERIOR CERVICAL FUSION/FORAMINOTOMY  01/28/2012   Procedure: POSTERIOR CERVICAL FUSION/FORAMINOTOMY LEVEL 3;  Surgeon: [name], MD;  Location: [facility];  Service: Neurosurgery;  Laterality: Left;  Posterior Cervical Five-Thoracic One Fusion, Arthrodesis with LEFT  Cervical Seven-thoracic One Laminectomy, Foraminotomy and Resection of Synovial Cyst  . POSTERIOR CERVICAL FUSION/FORAMINOTOMY N/A 01/29/2013   Procedure: POSTERIOR CERVICAL FUSION/FORAMINOTOMY LEVEL 1 and C2-5 Posteriolateral Arthrodesis;  Surgeon: [name], MD;  Location: [facility];  Service: Neurosurgery;  Laterality: N/A;  C2-C3 Laminectomy C2-C3 posterior cervical arthrodesis  . TONSILLECTOMY          Home Medications    Prior to Admission medications   Medication Sig Start Date End Date Taking? Authorizing Provider  alendronate (FOSAMAX) 70 MG tablet Take 70 mg by mouth once a week. On Wednesday. Remain upright for 30-60 minutes. 01/06/12  Yes [provider]  Amino Acids-Protein Hydrolys (FEEDING SUPPLEMENT, PRO-STAT SUGAR FREE 64,) LIQD Take 30 mLs by mouth 2 (two) times daily. 07/05/17  Yes Lavina Hamman, MD  aspirin EC 81 MG tablet Take 1 tablet (81 mg total) by mouth daily. 10/15/17  Yes Barrett, Evelene Croon, PA-C  calcium carbonate (OS-CAL) 600 MG tablet Take 600 mg by mouth daily after breakfast.    Yes [provider]  cyanocobalamin 500 MCG tablet Take 500 mcg by mouth at bedtime.   Yes [provider]  diltiazem (CARDIZEM CD) 300 MG 24 hr capsule TAKE 1 CAPSULE(300 MG) BY MOUTH EVERY MORNING. DO NOT CRUSH Patient taking differently: Take 300 mg by mouth daily.  12/03/17  Yes Croitoru, Mihai, MD  divalproex (DEPAKOTE) 250 MG DR tablet Take 250 mg by mouth every morning.   Yes [provider]  divalproex (DEPAKOTE) 250 MG DR tablet Take 500 mg by mouth every evening.    Yes [provider]  doxazosin (CARDURA) 4 MG tablet Take 4 mg by mouth daily.   Yes [provider]  ferrous sulfate 325 (65 FE) MG tablet Take 1 tablet (325 mg total) by mouth 2 (two) times daily with a meal. 06/28/17  Yes Skeet Latch, MD  furosemide (LASIX) 40 MG tablet Take 1 tablet (40 mg total) by mouth every morning. Or per cardiologist  instructions since patient cannot be weighed 11/28/17  Yes Croitoru, Mihai, MD  HYDROcodone-acetaminophen (NORCO) 10-325 MG tablet Take 1 tablet by mouth every 4 (four) hours as needed for moderate pain.   Yes [name], MD  levothyroxine (SYNTHROID, LEVOTHROID) 150 MCG tablet Take 150 mcg by mouth daily before breakfast. 12/27/11  Yes [provider]  lidocaine (LIDODERM) 5 % Place 1 patch onto the skin daily as needed. Remove & Discard patch within 12 hours or as directed by MD Patient taking differently: Place 1 patch onto the skin daily as needed (pain). Remove & Discard patch within 12 hours or as directed by MD  06/01/17  Yes Mikhail, Velta Addison, DO  metoprolol tartrate (LOPRESSOR) 25 MG tablet Take 1 tablet (25 mg total) by mouth 2 (two) times daily. Take with or immediately following a meal. 11/06/17  Yes Croitoru, Mihai, MD  Multiple Vitamins-Minerals (CERTAVITE/ANTIOXIDANTS) TABS Take 1 tablet by mouth every evening.    Yes [provider]  omeprazole (PRILOSEC) 20 MG capsule Take 20 mg by mouth  daily before breakfast.  12/23/11  Yes [provider]  potassium chloride SA (K-DUR,KLOR-CON) 20 MEQ tablet TAKE 1 TABLET BY MOUTH DAILY ALONG WITH LASIX 40MG  OR AS DIRECTED BY CARDIOLOGIST 03/11/18  Yes Croitoru, Mihai, MD  pravastatin (PRAVACHOL) 40 MG tablet Take 40 mg by mouth at bedtime.  10/29/14  Yes [provider]  predniSONE (DELTASONE) 10 MG tablet Take 1 tablet (10 mg total) by mouth daily with breakfast. 04/28/17  Yes Lavina Hamman, MD  VITAMIN D, ERGOCALCIFEROL, PO Take 5,000 Units by mouth daily.    Yes [provider]  ZETIA 10 MG tablet Take 5 mg by mouth at bedtime. (0.5 tablet) 10/15/11  Yes [provider]  acetaminophen (TYLENOL) 325 MG tablet Take 650 mg by mouth every 4 (four) hours as needed for mild pain.    [provider]  collagenase (SANTYL) ointment Apply 1 application topically daily as needed (as instructed  by [facility].).    [provider]  docusate sodium (COLACE) 100 MG capsule Take 100-200 mg by mouth daily as needed.     [provider]  nitroGLYCERIN (NITROSTAT) 0.4 MG SL tablet Place 0.4 mg under the tongue every 5 (five) minutes as needed for chest pain. Max of 3 tablets    [provider]  senna (SENOKOT) 8.6 MG tablet Take 1 tablet by mouth 2 (two) times daily as needed for constipation.     [provider]  silodosin (RAPAFLO) 4 MG CAPS capsule Take 1 capsule (4 mg total) by mouth daily with breakfast. Patient not taking: Reported on 03/13/2018 11/08/17   Croitoru, Dani Gobble, MD    Family History Family History  Problem Relation Age of Onset  . Hypertension Mother   . Cancer Mother   . Kidney failure Father   . Heart disease Father     Social History Social History   Tobacco Use  . Smoking status: Former Research scientist (life sciences)  . Smokeless tobacco: Never Used  Substance Use Topics  . Alcohol use: Yes    Comment: rare  . Drug use: No     Allergies   Eliquis [apixaban]; Demerol [meperidine]; and Keppra [levetiracetam]   Review of Systems Review of Systems  All other systems reviewed and are negative.    Physical Exam Updated Vital Signs BP 138/83   Pulse (!) 110   Temp (!) 100.7 F (38.2 C) (Rectal)   Resp 20   SpO2 98%   Physical Exam Vitals signs and nursing note reviewed.  Constitutional:      General: He is in acute distress.     Appearance: He is well-developed. He is ill-appearing. He is not toxic-appearing or diaphoretic.     Comments: Elderly, frail  HENT:     Head: Normocephalic and atraumatic.     Right Ear: External ear normal.     Left Ear: External ear normal.     Nose: Nose normal.     Mouth/Throat:     Pharynx: No oropharyngeal exudate or posterior oropharyngeal erythema.     Comments: Dry oral mucosa Eyes:     General:        Right eye: No discharge.        Left eye: No discharge.     Conjunctiva/sclera:  Conjunctivae normal.     Pupils: Pupils are equal, round, and reactive to light.  Neck:     Musculoskeletal: Normal range of motion and neck supple.     Trachea: Phonation normal.  Cardiovascular:  Rate and Rhythm: Regular rhythm. Tachycardia present.     Heart sounds: Normal heart sounds.  Pulmonary:     Effort: Pulmonary effort is normal. No respiratory distress.     Breath sounds: Normal breath sounds. No stridor. No rhonchi.  Abdominal:     General: Bowel sounds are normal. There is no distension.     Palpations: Abdomen is soft.     Tenderness: There is no abdominal tenderness.     Hernia: No hernia is present.  Musculoskeletal:     Comments: He guards against movement of the right ankle and foot secondary to pain.  Healing wound right lateral ankle, with normal granulation tissue, no drainage, or bleeding.  Right foot tender, edematous, erythematous.  Potential source for cellulitis, fifth toe web space, with maceration and skin breakdown.  Skin:    General: Skin is warm and dry.  Neurological:     Mental Status: He is alert and oriented to person, place, and time.     Cranial Nerves: No cranial nerve deficit.     Sensory: No sensory deficit.     Motor: No abnormal muscle tone.     Coordination: Coordination normal.  Psychiatric:        Mood and Affect: Mood normal.        Behavior: Behavior normal.      ED Treatments / Results  Labs (all labs ordered are listed, but only abnormal results are displayed) Labs Reviewed  COMPREHENSIVE METABOLIC PANEL - Abnormal; Notable for the following components:      Result Value   Glucose, Bld 101 (*)    BUN 58 (*)    Creatinine, Ser 1.56 (*)    Calcium 8.8 (*)    Total Protein 6.3 (*)    GFR calc non Af Amer 41 (*)    GFR calc Af Amer 47 (*)    All other components within normal limits  CBC WITH DIFFERENTIAL/PLATELET - Abnormal; Notable for the following components:   WBC 10.8 (*)    RBC 3.60 (*)    Hemoglobin 11.6 (*)      HCT 37.3 (*)    MCV 103.6 (*)    Platelets 134 (*)    Neutro Abs 8.5 (*)    Monocytes Absolute 1.5 (*)    Abs Immature Granulocytes 0.10 (*)    All other components within normal limits  CULTURE, BLOOD (ROUTINE X 2)  CULTURE, BLOOD (ROUTINE X 2)  URINE CULTURE  PROTIME-INR  URINALYSIS, ROUTINE W REFLEX MICROSCOPIC  I-STAT CG4 LACTIC ACID, ED    EKG None  Radiology Dg Chest 2 View  Result Date: 03/13/2018 CLINICAL DATA:  Sepsis. EXAM: CHEST - 2 VIEW COMPARISON:  Radiograph Jul 15, 2017. FINDINGS: Stable cardiomediastinal silhouette. Stable elevated left hemidiaphragm is noted with minimal left basilar subsegmental atelectasis. No pneumothorax or significant pleural effusion is noted. Right lung is clear. Bony thorax is unremarkable. IMPRESSION: Stable elevated left hemidiaphragm with minimal left basilar subsegmental atelectasis. Electronically Signed   By: Marijo Conception, M.D.   On: 03/13/2018 11:29   Dg Ankle Complete Right  Result Date: 03/13/2018 CLINICAL DATA:  Right ankle wound. EXAM: RIGHT ANKLE - COMPLETE 3+ VIEW COMPARISON:  Radiographs of May 23, 2017. FINDINGS: There is no evidence of fracture, dislocation, or joint effusion. There is no evidence of arthropathy or other focal bone abnormality. Vascular calcifications are noted. Probable soft tissue ulceration is seen overlying distal right fibula. No lytic destruction is seen to suggest osteomyelitis. IMPRESSION: Probable  soft tissue ulceration seen overlying distal right fibula. No significant bony abnormality is noted. Electronically Signed   By: Marijo Conception, M.D.   On: 03/13/2018 11:31    Procedures Procedures (including critical care time)  Medications Ordered in ED Medications  cefTRIAXone (ROCEPHIN) 2 g in sodium chloride 0.9 % 100 mL IVPB (0 g Intravenous Stopped 03/13/18 1104)  sodium chloride 0.9 % bolus 1,000 mL (0 mLs Intravenous Stopped 03/13/18 1144)    And  sodium chloride 0.9 % bolus 1,000 mL (0 mLs  Intravenous Stopped 03/13/18 1144)    And  sodium chloride 0.9 % bolus 250 mL (0 mLs Intravenous Stopped 03/13/18 1155)  ketorolac (TORADOL) 30 MG/ML injection 15 mg (15 mg Intravenous Given 03/13/18 1149)     Initial Impression / Assessment and Plan / ED Course  I have reviewed the triage vital signs and the nursing notes.  Pertinent labs & imaging results that were available during my care of the patient were reviewed by me and considered in my medical decision making (see chart for details).  Clinical Course as of Mar 13 1325  Thu Mar 13, 2018  1325 Normal  I-Stat CG4 Lactic Acid, ED [EW]  1325 Normal except glucose high, BUN high, creatinine high, calcium low, total protein low, GFR low  Comprehensive metabolic panel(!) [EW]  1017 Trending of renal function indicates minimal worsening over the last month.   [EW]  1326 Normal  Protime-INR [EW]  1326 Normal cervical slightly high, hemoglobin slightly low, MCV high  CBC with Differential(!) [EW]  1326 No osteomyelitis, images reviewed by me  DG Ankle Complete Right [EW]  1327 No CHF, or infiltrate, images reviewed by me  DG Chest 2 View [EW]    Clinical Course User Index [EW] Daleen Bo, MD     Patient Vitals for the past 24 hrs:  BP Temp Temp src Pulse Resp SpO2  03/13/18 1300 138/83 - - (!) 110 20 98 %  03/13/18 1250 130/73 - - 97 13 93 %  03/13/18 1217 137/63 - - (!) 101 14 97 %  03/13/18 1200 137/63 - - (!) 115 15 98 %  03/13/18 1144 (!) 144/71 - - (!) 104 (!) 21 92 %  03/13/18 1130 (!) 144/71 - - (!) 109 20 96 %  03/13/18 1037 140/85 - - (!) 110 (!) 26 96 %  03/13/18 1030 (!) 146/73 - - - 17 -  03/13/18 0955 (!) 149/86 (!) 100.7 F (38.2 C) Rectal (!) 119 16 90 %  03/13/18 0953 (!) 149/86 - - - - -    1:27 PM Reevaluation with update and discussion. After initial assessment and treatment, an updated evaluation reveals he is comfortable and ready to go home.  Findings discussed with patient and his wife, all  questions were answered. Daleen Bo   Medical Decision Making: Patient with apparent foot cellulitis.  Patient nontoxic with improvement during ED evaluation.  Empiric antibiotics initiated.  He is stable for discharge with outpatient management for foot infection.  Doubt osteomyelitis.  Doubt sepsis or metabolic instability.  CRITICAL CARE-no Performed by: Daleen Bo  Nursing Notes Reviewed/ Care Coordinated Applicable Imaging Reviewed Interpretation of Laboratory Data incorporated into ED treatment  The patient appears reasonably screened and/or stabilized for discharge and I doubt any other medical condition or other [facility] requiring further screening, evaluation, or treatment in the ED at this time prior to discharge.  Plan: Home Medications-continue usual medications; Home Treatments-heat to affected area and elevation of  the right foot to improve healing.; return here if the recommended treatment, does not improve the symptoms; Recommended follow up-PCP checkup 1 week and as needed.    Final Clinical Impressions(s) / ED Diagnoses   Final diagnoses:  Cellulitis of foot    ED Discharge Orders         Ordered    amoxicillin-clavulanate (AUGMENTIN) 875-125 MG tablet  2 times daily     03/13/18 1557           Daleen Bo, MD 03/15/18 1027

## 2018-03-13 NOTE — ED Notes (Signed)
Patient ambulated at total of 15 feet with a walker and standby assistance. Patient reports feeling "too tired to go on" and would not continue.

## 2018-03-13 NOTE — Care Management Note (Signed)
Case Management Note  CM contacted for increased [facility] services.  CM contacted Georgina Snell with [facility] to advised that [facility] PT has been ordered.  Updated Dr. Eulis Foster.  No further CM needs noted at this time.  Ariyana Faw, Benjaman Lobe, RN 03/13/2018, 4:32 PM

## 2018-03-13 NOTE — ED Notes (Signed)
The wife of the patient relayed a number of concerns to this RN regarding the patient's medications, the patient's care at home, the patient's inability to walk, the patient's temperature, and the patient's antibiotic. This RN answered the patient's questions to the best of my ability. Patient's wife was advised to give the patient the antibiotics as prescribed and to follow up with their family care physician in 3 days. Discharge paperwork reviewed with patient's wife, who verbalized understanding, but remained anxious. Patient's diaper was changed and peri care completed, prior to dressing patient in paper scrub pants and personal shirt and socks. Patient wrapped in blankets and wheeled to the car by this RN. Patient's wife pulled the car up and attempted to warm the car, but was unable. This RN sent patient home wrapped in blankets for his warmth and safety.

## 2018-03-13 NOTE — ED Notes (Signed)
Bed: WA21 Expected date:  Expected time:  Means of arrival:  Comments: EMS-right leg pain

## 2018-03-14 ENCOUNTER — Ambulatory Visit: Payer: Medicare Other | Admitting: Cardiovascular Disease

## 2018-03-15 ENCOUNTER — Inpatient Hospital Stay (HOSPITAL_COMMUNITY)
Admission: EM | Admit: 2018-03-15 | Discharge: 2018-03-20 | DRG: 871 | Disposition: A | Discharge status: Home Health | Payer: Medicare Other | Attending: Family Medicine | Admitting: Family Medicine

## 2018-03-15 ENCOUNTER — Encounter (HOSPITAL_COMMUNITY): Payer: Self-pay

## 2018-03-15 ENCOUNTER — Other Ambulatory Visit: Payer: Self-pay

## 2018-03-15 DIAGNOSIS — N179 Acute kidney failure, unspecified: Secondary | ICD-10-CM | POA: Diagnosis present

## 2018-03-15 DIAGNOSIS — L03115 Cellulitis of right lower limb: Secondary | ICD-10-CM

## 2018-03-15 DIAGNOSIS — Z888 Allergy status to other drugs, medicaments and biological substances status: Secondary | ICD-10-CM

## 2018-03-15 DIAGNOSIS — I482 Chronic atrial fibrillation, unspecified: Secondary | ICD-10-CM | POA: Diagnosis present

## 2018-03-15 DIAGNOSIS — N1831 Chronic kidney disease, stage 3a: Secondary | ICD-10-CM | POA: Diagnosis present

## 2018-03-15 DIAGNOSIS — I130 Hypertensive heart and chronic kidney disease with heart failure and stage 1 through stage 4 chronic kidney disease, or unspecified chronic kidney disease: Secondary | ICD-10-CM | POA: Diagnosis present

## 2018-03-15 DIAGNOSIS — A419 Sepsis, unspecified organism: Principal | ICD-10-CM | POA: Diagnosis present

## 2018-03-15 DIAGNOSIS — E785 Hyperlipidemia, unspecified: Secondary | ICD-10-CM | POA: Diagnosis present

## 2018-03-15 DIAGNOSIS — Z79899 Other long term (current) drug therapy: Secondary | ICD-10-CM

## 2018-03-15 DIAGNOSIS — D509 Iron deficiency anemia, unspecified: Secondary | ICD-10-CM | POA: Diagnosis present

## 2018-03-15 DIAGNOSIS — M1990 Unspecified osteoarthritis, unspecified site: Secondary | ICD-10-CM | POA: Diagnosis present

## 2018-03-15 DIAGNOSIS — Z8249 Family history of ischemic heart disease and other diseases of the circulatory system: Secondary | ICD-10-CM

## 2018-03-15 DIAGNOSIS — Z7982 Long term (current) use of aspirin: Secondary | ICD-10-CM

## 2018-03-15 DIAGNOSIS — L0390 Cellulitis, unspecified: Secondary | ICD-10-CM | POA: Diagnosis present

## 2018-03-15 DIAGNOSIS — N183 Chronic kidney disease, stage 3 (moderate): Secondary | ICD-10-CM | POA: Diagnosis present

## 2018-03-15 DIAGNOSIS — Z66 Do not resuscitate: Secondary | ICD-10-CM | POA: Diagnosis present

## 2018-03-15 DIAGNOSIS — Z9861 Coronary angioplasty status: Secondary | ICD-10-CM

## 2018-03-15 DIAGNOSIS — E7800 Pure hypercholesterolemia, unspecified: Secondary | ICD-10-CM | POA: Diagnosis present

## 2018-03-15 DIAGNOSIS — I2510 Atherosclerotic heart disease of native coronary artery without angina pectoris: Secondary | ICD-10-CM

## 2018-03-15 DIAGNOSIS — Z7952 Long term (current) use of systemic steroids: Secondary | ICD-10-CM

## 2018-03-15 DIAGNOSIS — Z96653 Presence of artificial knee joint, bilateral: Secondary | ICD-10-CM | POA: Diagnosis present

## 2018-03-15 DIAGNOSIS — Z7983 Long term (current) use of bisphosphonates: Secondary | ICD-10-CM

## 2018-03-15 DIAGNOSIS — Z87891 Personal history of nicotine dependence: Secondary | ICD-10-CM

## 2018-03-15 DIAGNOSIS — E039 Hypothyroidism, unspecified: Secondary | ICD-10-CM | POA: Diagnosis present

## 2018-03-15 DIAGNOSIS — E860 Dehydration: Secondary | ICD-10-CM | POA: Diagnosis present

## 2018-03-15 DIAGNOSIS — Z85828 Personal history of other malignant neoplasm of skin: Secondary | ICD-10-CM

## 2018-03-15 DIAGNOSIS — Z79890 Hormone replacement therapy (postmenopausal): Secondary | ICD-10-CM

## 2018-03-15 DIAGNOSIS — E870 Hyperosmolality and hypernatremia: Secondary | ICD-10-CM

## 2018-03-15 DIAGNOSIS — K219 Gastro-esophageal reflux disease without esophagitis: Secondary | ICD-10-CM | POA: Diagnosis present

## 2018-03-15 DIAGNOSIS — R410 Disorientation, unspecified: Secondary | ICD-10-CM

## 2018-03-15 DIAGNOSIS — F5104 Psychophysiologic insomnia: Secondary | ICD-10-CM | POA: Diagnosis present

## 2018-03-15 DIAGNOSIS — G9341 Metabolic encephalopathy: Secondary | ICD-10-CM | POA: Diagnosis present

## 2018-03-15 DIAGNOSIS — G4733 Obstructive sleep apnea (adult) (pediatric): Secondary | ICD-10-CM | POA: Diagnosis present

## 2018-03-15 DIAGNOSIS — F22 Delusional disorders: Secondary | ICD-10-CM | POA: Diagnosis present

## 2018-03-15 DIAGNOSIS — D631 Anemia in chronic kidney disease: Secondary | ICD-10-CM | POA: Diagnosis present

## 2018-03-15 DIAGNOSIS — G40909 Epilepsy, unspecified, not intractable, without status epilepticus: Secondary | ICD-10-CM

## 2018-03-15 DIAGNOSIS — N400 Enlarged prostate without lower urinary tract symptoms: Secondary | ICD-10-CM | POA: Diagnosis present

## 2018-03-15 DIAGNOSIS — I5032 Chronic diastolic (congestive) heart failure: Secondary | ICD-10-CM | POA: Diagnosis present

## 2018-03-15 DIAGNOSIS — Z515 Encounter for palliative care: Secondary | ICD-10-CM | POA: Diagnosis present

## 2018-03-15 DIAGNOSIS — Z955 Presence of coronary angioplasty implant and graft: Secondary | ICD-10-CM

## 2018-03-15 LAB — COMPREHENSIVE METABOLIC PANEL
ALT: 24 U/L (ref 0–44)
AST: 42 U/L — ABNORMAL HIGH (ref 15–41)
Albumin: 3 g/dL — ABNORMAL LOW (ref 3.5–5.0)
Alkaline Phosphatase: 67 U/L (ref 38–126)
Anion gap: 14 (ref 5–15)
BUN: 38 mg/dL — ABNORMAL HIGH (ref 8–23)
CO2: 22 mmol/L (ref 22–32)
Calcium: 7.9 mg/dL — ABNORMAL LOW (ref 8.9–10.3)
Chloride: 110 mmol/L (ref 98–111)
Creatinine, Ser: 1.68 mg/dL — ABNORMAL HIGH (ref 0.61–1.24)
GFR calc Af Amer: 43 mL/min — ABNORMAL LOW (ref >60)
GFR calc non Af Amer: 37 mL/min — ABNORMAL LOW (ref >60)
Glucose, Bld: 103 mg/dL — ABNORMAL HIGH (ref 70–99)
Potassium: 4.2 mmol/L (ref 3.5–5.1)
Sodium: 146 mmol/L — ABNORMAL HIGH (ref 135–145)
Total Bilirubin: 1 mg/dL (ref 0.3–1.2)
Total Protein: 5.7 g/dL — ABNORMAL LOW (ref 6.5–8.1)

## 2018-03-15 LAB — CBC WITH DIFFERENTIAL/PLATELET
Abs Immature Granulocytes: 0.1 10*3/uL — ABNORMAL HIGH (ref 0.00–0.07)
Basophils Absolute: 0 10*3/uL (ref 0.0–0.1)
Basophils Relative: 0 %
Eosinophils Absolute: 0 10*3/uL (ref 0.0–0.5)
Eosinophils Relative: 0 %
HCT: 30.5 % — ABNORMAL LOW (ref 39.0–52.0)
Hemoglobin: 9.3 g/dL — ABNORMAL LOW (ref 13.0–17.0)
Immature Granulocytes: 1 %
Lymphocytes Relative: 2 %
Lymphs Abs: 0.2 10*3/uL — ABNORMAL LOW (ref 0.7–4.0)
MCH: 32.4 pg (ref 26.0–34.0)
MCHC: 30.5 g/dL (ref 30.0–36.0)
MCV: 106.3 fL — ABNORMAL HIGH (ref 80.0–100.0)
Monocytes Absolute: 1 10*3/uL (ref 0.1–1.0)
Monocytes Relative: 9 %
Neutro Abs: 9.9 10*3/uL — ABNORMAL HIGH (ref 1.7–7.7)
Neutrophils Relative %: 88 %
Platelets: 130 10*3/uL — ABNORMAL LOW (ref 150–400)
RBC: 2.87 MIL/uL — ABNORMAL LOW (ref 4.22–5.81)
RDW: 14.6 % (ref 11.5–15.5)
WBC: 11.2 10*3/uL — ABNORMAL HIGH (ref 4.0–10.5)
nRBC: 0 % (ref 0.0–0.2)

## 2018-03-15 LAB — I-STAT CG4 LACTIC ACID, ED: Lactic Acid, Venous: 0.99 mmol/L (ref 0.5–1.9)

## 2018-03-15 LAB — URINE CULTURE: Culture: NO GROWTH

## 2018-03-15 MED ORDER — POLYETHYLENE GLYCOL 3350 17 G PO PACK
17.0000 g | PACK | Freq: Every day | ORAL | Status: DC | PRN
  Filled 2018-03-15: qty 1

## 2018-03-15 MED ORDER — DIVALPROEX SODIUM 250 MG PO DR TAB
500.0000 mg | DELAYED_RELEASE_TABLET | Freq: Every day | ORAL | Status: DC
  Administered 2018-03-16 – 2018-03-19 (×5): 500 mg via ORAL
  Filled 2018-03-15 (×6): qty 2

## 2018-03-15 MED ORDER — EZETIMIBE 10 MG PO TABS
5.0000 mg | ORAL_TABLET | Freq: Every day | ORAL | Status: DC
  Administered 2018-03-16 – 2018-03-19 (×5): 5 mg via ORAL
  Filled 2018-03-15 (×5): qty 1

## 2018-03-15 MED ORDER — VANCOMYCIN HCL IN DEXTROSE 750-5 MG/150ML-% IV SOLN
750.0000 mg | INTRAVENOUS | Status: DC
  Administered 2018-03-16 – 2018-03-17 (×2): 750 mg via INTRAVENOUS
  Filled 2018-03-15 (×3): qty 150

## 2018-03-15 MED ORDER — PIPERACILLIN-TAZOBACTAM 3.375 G IVPB 30 MIN
3.3750 g | Freq: Once | INTRAVENOUS | Status: AC
  Administered 2018-03-15: 3.375 g via INTRAVENOUS
  Filled 2018-03-15: qty 50

## 2018-03-15 MED ORDER — ALENDRONATE SODIUM 70 MG PO TABS: 70.0000 mg | ORAL_TABLET | ORAL | Status: DC

## 2018-03-15 MED ORDER — PRO-STAT SUGAR FREE PO LIQD
30.0000 mL | Freq: Two times a day (BID) | ORAL | Status: DC
  Administered 2018-03-16 – 2018-03-20 (×9): 30 mL via ORAL
  Filled 2018-03-15 (×9): qty 30

## 2018-03-15 MED ORDER — PRAVASTATIN SODIUM 40 MG PO TABS
40.0000 mg | ORAL_TABLET | Freq: Every day | ORAL | Status: DC
  Administered 2018-03-16 – 2018-03-19 (×5): 40 mg via ORAL
  Filled 2018-03-15 (×5): qty 1

## 2018-03-15 MED ORDER — FERROUS SULFATE 325 (65 FE) MG PO TABS
325.0000 mg | ORAL_TABLET | Freq: Two times a day (BID) | ORAL | Status: DC
  Administered 2018-03-16 – 2018-03-20 (×9): 325 mg via ORAL
  Filled 2018-03-15 (×9): qty 1

## 2018-03-15 MED ORDER — ADULT MULTIVITAMIN W/MINERALS CH
1.0000 | ORAL_TABLET | Freq: Every day | ORAL | Status: DC
  Administered 2018-03-16 – 2018-03-19 (×5): 1 via ORAL
  Filled 2018-03-15 (×5): qty 1

## 2018-03-15 MED ORDER — CYANOCOBALAMIN 250 MCG PO TABS
500.0000 ug | ORAL_TABLET | Freq: Every day | ORAL | Status: DC
  Administered 2018-03-16 – 2018-03-19 (×5): 500 ug via ORAL
  Filled 2018-03-15 (×5): qty 2

## 2018-03-15 MED ORDER — SODIUM CHLORIDE 0.9 % IV BOLUS (SEPSIS)
1000.0000 mL | Freq: Once | INTRAVENOUS | Status: AC
  Administered 2018-03-15: 1000 mL via INTRAVENOUS

## 2018-03-15 MED ORDER — CALCIUM CARBONATE 1250 (500 CA) MG PO TABS
1.0000 | ORAL_TABLET | Freq: Every day | ORAL | Status: DC
  Administered 2018-03-16 – 2018-03-20 (×5): 500 mg via ORAL
  Filled 2018-03-15 (×5): qty 1

## 2018-03-15 MED ORDER — DOXAZOSIN MESYLATE 2 MG PO TABS
4.0000 mg | ORAL_TABLET | Freq: Every day | ORAL | Status: DC
  Administered 2018-03-16 – 2018-03-20 (×5): 4 mg via ORAL
  Filled 2018-03-15 (×5): qty 2

## 2018-03-15 MED ORDER — NITROGLYCERIN 0.4 MG SL SUBL: 0.4000 mg | SUBLINGUAL_TABLET | SUBLINGUAL | Status: DC | PRN

## 2018-03-15 MED ORDER — LIDOCAINE 5 % EX PTCH
1.0000 | MEDICATED_PATCH | Freq: Every day | CUTANEOUS | Status: DC | PRN
  Filled 2018-03-15: qty 1

## 2018-03-15 MED ORDER — ASPIRIN EC 81 MG PO TBEC
81.0000 mg | DELAYED_RELEASE_TABLET | Freq: Every day | ORAL | Status: DC
  Administered 2018-03-16 – 2018-03-20 (×5): 81 mg via ORAL
  Filled 2018-03-15 (×5): qty 1

## 2018-03-15 MED ORDER — ACETAMINOPHEN 325 MG PO TABS: 650.0000 mg | ORAL_TABLET | ORAL | Status: DC | PRN

## 2018-03-15 MED ORDER — DIVALPROEX SODIUM 250 MG PO DR TAB
250.0000 mg | DELAYED_RELEASE_TABLET | Freq: Every day | ORAL | Status: DC
  Administered 2018-03-16 – 2018-03-20 (×5): 250 mg via ORAL
  Filled 2018-03-15 (×5): qty 1

## 2018-03-15 MED ORDER — PANTOPRAZOLE SODIUM 40 MG PO TBEC
40.0000 mg | DELAYED_RELEASE_TABLET | Freq: Every day | ORAL | Status: DC
  Administered 2018-03-16 – 2018-03-20 (×5): 40 mg via ORAL
  Filled 2018-03-15 (×5): qty 1

## 2018-03-15 MED ORDER — HYDROCODONE-ACETAMINOPHEN 10-325 MG PO TABS
1.0000 | ORAL_TABLET | ORAL | Status: DC | PRN
  Administered 2018-03-15 – 2018-03-20 (×13): 1 via ORAL
  Filled 2018-03-15 (×13): qty 1

## 2018-03-15 MED ORDER — SODIUM CHLORIDE 0.9 % IV SOLN
1000.0000 mL | INTRAVENOUS | Status: DC
  Administered 2018-03-15: 1000 mL via INTRAVENOUS

## 2018-03-15 MED ORDER — LEVOTHYROXINE SODIUM 75 MCG PO TABS
150.0000 ug | ORAL_TABLET | Freq: Every day | ORAL | Status: DC
  Administered 2018-03-16 – 2018-03-20 (×5): 150 ug via ORAL
  Filled 2018-03-15 (×5): qty 2

## 2018-03-15 MED ORDER — BISACODYL 5 MG PO TBEC: 5.0000 mg | DELAYED_RELEASE_TABLET | Freq: Every day | ORAL | Status: DC | PRN

## 2018-03-15 MED ORDER — DEXTROSE 5 % IV SOLN
INTRAVENOUS | Status: DC
  Administered 2018-03-16 – 2018-03-17 (×2): via INTRAVENOUS

## 2018-03-15 MED ORDER — CALCIUM 600 MG PO TABS: 600.0000 mg | ORAL_TABLET | Freq: Every day | ORAL | Status: DC

## 2018-03-15 MED ORDER — PREDNISONE 5 MG PO TABS
10.0000 mg | ORAL_TABLET | Freq: Every day | ORAL | Status: DC
  Administered 2018-03-16: 10 mg via ORAL
  Filled 2018-03-15: qty 2

## 2018-03-15 MED ORDER — VANCOMYCIN HCL 10 G IV SOLR
1500.0000 mg | INTRAVENOUS | Status: AC
  Administered 2018-03-15: 1500 mg via INTRAVENOUS
  Filled 2018-03-15: qty 1500

## 2018-03-15 MED ORDER — DILTIAZEM HCL ER COATED BEADS 180 MG PO CP24
300.0000 mg | ORAL_CAPSULE | Freq: Every day | ORAL | Status: DC
  Administered 2018-03-16 – 2018-03-20 (×5): 300 mg via ORAL
  Filled 2018-03-15 (×5): qty 1

## 2018-03-15 MED ORDER — METOPROLOL TARTRATE 25 MG PO TABS
25.0000 mg | ORAL_TABLET | Freq: Two times a day (BID) | ORAL | Status: DC
  Administered 2018-03-16 – 2018-03-20 (×9): 25 mg via ORAL
  Filled 2018-03-15 (×10): qty 1

## 2018-03-15 NOTE — ED Notes (Signed)
Report given to [facility] for 4E, Room [phone].

## 2018-03-15 NOTE — ED Notes (Signed)
First set of cultures are at bedside.

## 2018-03-15 NOTE — Progress Notes (Signed)
PHARMACIST - PHYSICIAN COMMUNICATION  CONCERNING: P&T Medication Policy Regarding Oral Bisphosphonates  RECOMMENDATION: Your order for alendronate (Fosamax) has been discontinued at this time.  If the patient's post-hospital medical condition warrants safe use of this class of drugs, please resume the pre-hospital regimen upon discharge.  DESCRIPTION:  Alendronate (Fosamax) can cause severe esophageal erosions in patients who are unable to remain upright at least 30 minutes after taking this medication.   Since brief interruptions in therapy are thought to have minimal impact on bone mineral density, the [place] has established that bisphosphonate orders should be routinely discontinued during hospitalization.   To override this safety policy and permit administration of Boniva, Fosamax, or Actonel in the hospital, prescribers must write "DO NOT HOLD" in the comments section when placing the order for this class of medications.  Leone Haven, PharmD

## 2018-03-15 NOTE — ED Notes (Signed)
Bed: KN39 Expected date:  Expected time:  Means of arrival:  Comments: Patient and Hosptialist still in room.

## 2018-03-15 NOTE — ED Notes (Signed)
ED TO INPATIENT HANDOFF REPORT  Name/Age/Gender [name] 85 y.o. male  Code Status Code Status History    Date Active Date Inactive Code Status Order ID Comments User Context   07/11/2017 0616 07/19/2017 1752 DNR [id number]  [name], DO ED   07/03/2017 1232 07/05/2017 1736 DNR [id number]  [name], NP Inpatient   07/01/2017 2127 07/03/2017 1232 Full Code [id number]  [name], MD Inpatient   05/24/2017 0348 06/01/2017 2026 Full Code [id number]  [name], MD ED   04/17/2017 2317 04/25/2017 1955 Full Code [id number]  [name], MD ED   07/06/2015 0919 07/11/2015 1902 Full Code [id number]  [name], MD ED   01/31/2015 1837 02/05/2015 1735 Full Code [id number]  [name], MD Inpatient   01/28/2013 2137 01/31/2013 1359 Full Code [id number]  [name], MD ED   01/28/2012 1808 01/30/2012 1847 Full Code [id number]  [name], [name], RN Inpatient    Questions for Most Recent Historical Code Status (Order [id number])    Question Answer Comment   In the event of cardiac or respiratory ARREST Do not call a "code blue"    In the event of cardiac or respiratory ARREST Do not perform Intubation, CPR, defibrillation or ACLS    In the event of cardiac or respiratory ARREST Use medication by any route, position, wound care, and other measures to relive pain and suffering. May use oxygen, suction and manual treatment of airway obstruction as needed for comfort.       Home/SNF/Other Home  Chief Complaint cellulitis  Level of Care/Admitting Diagnosis ED Disposition    ED Disposition Condition [facility] Area: [facility] [[id number]]  Level of Care: Med-Surg [16]  Diagnosis: Cellulitis [322025]  Admitting Physician: Elmarie Shiley [[id number]]  Attending Physician: Niel Hummer A [[id number]]  PT Class (Do Not Modify): Observation [104]  PT Acc Code (Do Not Modify): Observation [10022]       Medical History Past  Medical History:  Diagnosis Date  . Abnormality of gait 09/22/2015  . Arthritis   . Atrial fibrillation ([place])   . CAD (coronary artery disease)    Stent to RCA, Penta stent, 99% reduced to 0% 2002.  . Cancer ([place])    skin CA removed from back  . Chronic insomnia 03/30/2015  . Complication of anesthesia    trouble waking up  . Constipation    from medications taken  . GERD (gastroesophageal reflux disease)   . Hypercholesteremia   . Hypertension   . Hypothyroidism   . Memory difficulty 09/22/2015  . Osteoarthritis   . Seizures ([place])   . Sepsis ([place]) 05/2017  . Transient alteration of awareness 03/30/2015  . Vertigo    hx of    Allergies Allergies  Allergen Reactions  . Eliquis [Apixaban] Other (See Comments)    bleeding  . Demerol [Meperidine] Nausea And Vomiting  . Keppra [Levetiracetam] Other (See Comments)    Causes sleepiness, mental status changes    IV Location/Drains/Wounds Patient Lines/Drains/Airways Status   Active Line/Drains/Airways    Name:   Placement date:   Placement time:   Site:   Days:   Peripheral IV 03/15/18 Left;Distal Forearm   03/15/18    1449    Forearm   less than 1   Peripheral IV 03/15/18 Left;Upper Forearm   03/15/18    1542    Forearm   less than 1  Labs/Imaging Results for orders placed or performed during the hospital encounter of 03/15/18 (from the past 48 hour(s))  Comprehensive metabolic panel     Status: Abnormal   Collection Time: 03/15/18  2:37 PM  Result Value Ref Range   Sodium 146 (H) 135 - 145 mmol/L   Potassium 4.2 3.5 - 5.1 mmol/L   Chloride 110 98 - 111 mmol/L   CO2 22 22 - 32 mmol/L   Glucose, Bld 103 (H) 70 - 99 mg/dL   BUN 38 (H) 8 - 23 mg/dL   Creatinine, Ser 1.68 (H) 0.61 - 1.24 mg/dL   Calcium 7.9 (L) 8.9 - 10.3 mg/dL   Total Protein 5.7 (L) 6.5 - 8.1 g/dL   Albumin 3.0 (L) 3.5 - 5.0 g/dL   AST 42 (H) 15 - 41 U/L   ALT 24 0 - 44 U/L   Alkaline Phosphatase 67 38 - 126 U/L   Total Bilirubin 1.0 0.3 -  1.2 mg/dL   GFR calc non Af Amer 37 (L) >60 mL/min   GFR calc Af Amer 43 (L) >60 mL/min   Anion gap 14 5 - 15    Comment: Performed at [facility], [place] [address] [place], [place] [address]  CBC with Differential     Status: Abnormal   Collection Time: 03/15/18  2:37 PM  Result Value Ref Range   WBC 11.2 (H) 4.0 - 10.5 K/uL   RBC 2.87 (L) 4.22 - 5.81 MIL/uL   Hemoglobin 9.3 (L) 13.0 - 17.0 g/dL   HCT 30.5 (L) 39.0 - 52.0 %   MCV 106.3 (H) 80.0 - 100.0 fL   MCH 32.4 26.0 - 34.0 pg   MCHC 30.5 30.0 - 36.0 g/dL   RDW 14.6 11.5 - 15.5 %   Platelets 130 (L) 150 - 400 K/uL    Comment: Immature Platelet Fraction may be clinically indicated, consider ordering this additional test [id number]    nRBC 0.0 0.0 - 0.2 %   Neutrophils Relative % 88 %   Neutro Abs 9.9 (H) 1.7 - 7.7 K/uL   Lymphocytes Relative 2 %   Lymphs Abs 0.2 (L) 0.7 - 4.0 K/uL   Monocytes Relative 9 %   Monocytes Absolute 1.0 0.1 - 1.0 K/uL   Eosinophils Relative 0 %   Eosinophils Absolute 0.0 0.0 - 0.5 K/uL   Basophils Relative 0 %   Basophils Absolute 0.0 0.0 - 0.1 K/uL   RBC Morphology MORPHOLOGY UNREMARKABLE    Immature Granulocytes 1 %   Abs Immature Granulocytes 0.10 (H) 0.00 - 0.07 K/uL    Comment: Performed at [facility], [place] [address] [place], [place] [address]  I-Stat CG4 Lactic Acid, ED     Status: None   Collection Time: 03/15/18  2:55 PM  Result Value Ref Range   Lactic Acid, Venous 0.99 0.5 - 1.9 mmol/L   No results found. None  Pending Labs Unresulted Labs (From admission, onward)    Start     Ordered   03/15/18 1515  Blood culture (routine x 2)  BLOOD CULTURE X 2,   STAT     03/15/18 1514   03/15/18 1437  Urinalysis, Routine w reflex microscopic  ONCE - STAT,   STAT     03/15/18 1436          Vitals/Pain Today's Vitals   03/15/18 1530 03/15/18 1630 03/15/18 1700 03/15/18 1730  BP: 105/70 (!) 109/48 100/63 101/62  Pulse: 84 77 63 75  Resp: 14  11 10  (!) 22  Temp:      TempSrc:      SpO2: 98% 98% 95% 96%  Weight:      Height:      PainSc:        Isolation Precautions No active isolations  Medications Medications  sodium chloride 0.9 % bolus 1,000 mL (0 mLs Intravenous Stopped 03/15/18 1720)    Followed by  0.9 %  sodium chloride infusion (1,000 mLs Intravenous New Bag/Given 03/15/18 1544)  piperacillin-tazobactam (ZOSYN) IVPB 3.375 g (0 g Intravenous Stopped 03/15/18 1720)    Mobility walks

## 2018-03-15 NOTE — ED Triage Notes (Signed)
Patient arrived via [facility]. Patient is from home. Patient called 911 due to emergency involving patients right lower leg having cellulitis per patient. Patient has small open wound on right ankle. Patient has no other complaints. Patient is AOx4 and ambulatory with assistance.

## 2018-03-15 NOTE — ED Notes (Signed)
ED TO INPATIENT HANDOFF REPORT  Name/Age/Gender [name] 85 y.o. male  Code Status Code Status History    Date Active Date Inactive Code Status Order ID Comments User Context   07/11/2017 0616 07/19/2017 1752 DNR [id number]  [name], DO ED   07/03/2017 1232 07/05/2017 1736 DNR [id number]  [name], NP Inpatient   07/01/2017 2127 07/03/2017 1232 Full Code [id number]  [name], MD Inpatient   05/24/2017 0348 06/01/2017 2026 Full Code [id number]  [name], MD ED   04/17/2017 2317 04/25/2017 1955 Full Code [id number]  [name], MD ED   07/06/2015 0919 07/11/2015 1902 Full Code [id number]  [name], MD ED   01/31/2015 1837 02/05/2015 1735 Full Code [id number]  [name], MD Inpatient   01/28/2013 2137 01/31/2013 1359 Full Code [id number]  [name], MD ED   01/28/2012 1808 01/30/2012 1847 Full Code [id number]  [name], [name], RN Inpatient    Questions for Most Recent Historical Code Status (Order [id number])    Question Answer Comment   In the event of cardiac or respiratory ARREST Do not call a "code blue"    In the event of cardiac or respiratory ARREST Do not perform Intubation, CPR, defibrillation or ACLS    In the event of cardiac or respiratory ARREST Use medication by any route, position, wound care, and other measures to relive pain and suffering. May use oxygen, suction and manual treatment of airway obstruction as needed for comfort.       Home/SNF/Other Home  Chief Complaint cellulitis  Level of Care/Admitting Diagnosis ED Disposition    ED Disposition Condition Comment   Admit  Hospital Area: [facility] [[id number]]  Level of Care: Telemetry [5]  Admit to tele based on following criteria: Complex arrhythmia (Bradycardia/Tachycardia)  Diagnosis: Cellulitis [[id number]]  Admitting Physician: Elmarie Shiley [phone]  Attending Physician: Niel Hummer A [[id number]]  PT Class (Do Not Modify): Observation  [104]  PT Acc Code (Do Not Modify): Observation [10022]       Medical History Past Medical History:  Diagnosis Date  . Abnormality of gait 09/22/2015  . Arthritis   . Atrial fibrillation ([place])   . CAD (coronary artery disease)    Stent to RCA, Penta stent, 99% reduced to 0% 2002.  . Cancer ([place])    skin CA removed from back  . Chronic insomnia 03/30/2015  . Complication of anesthesia    trouble waking up  . Constipation    from medications taken  . GERD (gastroesophageal reflux disease)   . Hypercholesteremia   . Hypertension   . Hypothyroidism   . Memory difficulty 09/22/2015  . Osteoarthritis   . Seizures ([place])   . Sepsis ([place]) 05/2017  . Transient alteration of awareness 03/30/2015  . Vertigo    hx of    Allergies Allergies  Allergen Reactions  . Eliquis [Apixaban] Other (See Comments)    bleeding  . Demerol [Meperidine] Nausea And Vomiting  . Keppra [Levetiracetam] Other (See Comments)    Causes sleepiness, mental status changes    IV Location/Drains/Wounds Patient Lines/Drains/Airways Status   Active Line/Drains/Airways    Name:   Placement date:   Placement time:   Site:   Days:   Peripheral IV 03/15/18 Left;Distal Forearm   03/15/18    1449    Forearm   less than 1   Peripheral IV 03/15/18 Left;Upper Forearm   03/15/18    1542    Forearm  less than 1          Labs/Imaging Results for orders placed or performed during the hospital encounter of 03/15/18 (from the past 48 hour(s))  Comprehensive metabolic panel     Status: Abnormal   Collection Time: 03/15/18  2:37 PM  Result Value Ref Range   Sodium 146 (H) 135 - 145 mmol/L   Potassium 4.2 3.5 - 5.1 mmol/L   Chloride 110 98 - 111 mmol/L   CO2 22 22 - 32 mmol/L   Glucose, Bld 103 (H) 70 - 99 mg/dL   BUN 38 (H) 8 - 23 mg/dL   Creatinine, Ser 1.68 (H) 0.61 - 1.24 mg/dL   Calcium 7.9 (L) 8.9 - 10.3 mg/dL   Total Protein 5.7 (L) 6.5 - 8.1 g/dL   Albumin 3.0 (L) 3.5 - 5.0 g/dL   AST 42 (H) 15 - 41 U/L    ALT 24 0 - 44 U/L   Alkaline Phosphatase 67 38 - 126 U/L   Total Bilirubin 1.0 0.3 - 1.2 mg/dL   GFR calc non Af Amer 37 (L) >60 mL/min   GFR calc Af Amer 43 (L) >60 mL/min   Anion gap 14 5 - 15    Comment: Performed at [facility], [place] [address], [place], [place] [address]  CBC with Differential     Status: Abnormal   Collection Time: 03/15/18  2:37 PM  Result Value Ref Range   WBC 11.2 (H) 4.0 - 10.5 K/uL   RBC 2.87 (L) 4.22 - 5.81 MIL/uL   Hemoglobin 9.3 (L) 13.0 - 17.0 g/dL   HCT 30.5 (L) 39.0 - 52.0 %   MCV 106.3 (H) 80.0 - 100.0 fL   MCH 32.4 26.0 - 34.0 pg   MCHC 30.5 30.0 - 36.0 g/dL   RDW 14.6 11.5 - 15.5 %   Platelets 130 (L) 150 - 400 K/uL    Comment: Immature Platelet Fraction may be clinically indicated, consider ordering this additional test [id number]    nRBC 0.0 0.0 - 0.2 %   Neutrophils Relative % 88 %   Neutro Abs 9.9 (H) 1.7 - 7.7 K/uL   Lymphocytes Relative 2 %   Lymphs Abs 0.2 (L) 0.7 - 4.0 K/uL   Monocytes Relative 9 %   Monocytes Absolute 1.0 0.1 - 1.0 K/uL   Eosinophils Relative 0 %   Eosinophils Absolute 0.0 0.0 - 0.5 K/uL   Basophils Relative 0 %   Basophils Absolute 0.0 0.0 - 0.1 K/uL   RBC Morphology MORPHOLOGY UNREMARKABLE    Immature Granulocytes 1 %   Abs Immature Granulocytes 0.10 (H) 0.00 - 0.07 K/uL    Comment: Performed at [facility], [place] [address], [place], [place] [address]  I-Stat CG4 Lactic Acid, ED     Status: None   Collection Time: 03/15/18  2:55 PM  Result Value Ref Range   Lactic Acid, Venous 0.99 0.5 - 1.9 mmol/L   No results found. None  Pending Labs Unresulted Labs (From admission, onward)    Start     Ordered   03/15/18 1515  Blood culture (routine x 2)  BLOOD CULTURE X 2,   STAT     03/15/18 1514   03/15/18 1437  Urinalysis, Routine w reflex microscopic  ONCE - STAT,   STAT     03/15/18 1436   Signed and Held  Basic metabolic panel  Tomorrow morning,   R     Signed and  Held  Signed and Held  CBC  Tomorrow morning,   R     Signed and Held          Vitals/Pain Today's Vitals   03/15/18 1630 03/15/18 1700 03/15/18 1730 03/15/18 1830  BP: (!) 109/48 100/63 101/62 115/87  Pulse: 77 63 75 75  Resp: 11 10 (!) 22 19  Temp:      TempSrc:      SpO2: 98% 95% 96% 96%  Weight:      Height:      PainSc:        Isolation Precautions No active isolations  Medications Medications  sodium chloride 0.9 % bolus 1,000 mL (0 mLs Intravenous Stopped 03/15/18 1720)    Followed by  0.9 %  sodium chloride infusion (1,000 mLs Intravenous New Bag/Given 03/15/18 1544)  vancomycin (VANCOCIN) 1,500 mg in sodium chloride 0.9 % 500 mL IVPB (has no administration in time range)  vancomycin (VANCOCIN) IVPB 750 mg/150 ml premix (has no administration in time range)  piperacillin-tazobactam (ZOSYN) IVPB 3.375 g (0 g Intravenous Stopped 03/15/18 1720)    Mobility walks

## 2018-03-15 NOTE — Progress Notes (Signed)
Pharmacy Antibiotic Note  [name] is a 85 y.o. male admitted on 03/15/2018 with confusion and persistent cellulitis.  Patient has received Zosyn 3.375gm IV x 1 dose in the ED.  Pharmacy has been consulted for Vancomycin dosing.  Plan:  Vancomycin 1500mg  IV x 1 loading dose followed by 750mg  IV q24h (Goal AUC 400-500)  Follow culture results & sensitivities  Follow renal function, check Vancomycin levels when appropriate  Height: 5\' 9"  (175.3 cm) Weight: 165 lb 5.5 oz (75 kg) IBW/kg (Calculated) : 70.7  Temp (24hrs), Avg:99 F (37.2 C), Min:99 F (37.2 C), Max:99 F (37.2 C)  Recent Labs  Lab 03/13/18 1013 03/13/18 1022 03/15/18 1437 03/15/18 1455  WBC 10.8*  --  11.2*  --   CREATININE 1.56*  --  1.68*  --   LATICACIDVEN  --  1.57  --  0.99    Estimated Creatinine Clearance: 33.9 mL/min (A) (by C-G formula based on SCr of 1.68 mg/dL (H)).    Allergies  Allergen Reactions  . Eliquis [Apixaban] Other (See Comments)    bleeding  . Demerol [Meperidine] Nausea And Vomiting  . Keppra [Levetiracetam] Other (See Comments)    Causes sleepiness, mental status changes    Antimicrobials this admission: 1/4 Vanc >>   1/4 Zosyn x 1 dose in ED   Dose adjustments this admission:    Microbiology results: 1/4 BCx: sent  Thank you for allowing pharmacy to be a part of this patient's care.  Everette Rank, PharmD 03/15/2018 6:37 PM

## 2018-03-15 NOTE — ED Notes (Signed)
Hospitalist has added Telemetry. [facility] does not accept Telemetry. RN has informed Hospitalist and hospital stated she will adjust orders.

## 2018-03-15 NOTE — ED Provider Notes (Signed)
[facility] DEPT Provider Note   CSN: [id number] Arrival date & time: 03/15/18  1351     History   Chief Complaint Chief Complaint  Patient presents with  . Cellulitis    HPI [name] is a 85 y.o. male.  HPI Pt presented to the ED for persistent cellulitis.  Pt was seen in the ED a few days ago.  Today he seemed confused.  He didn't remember going to the bathroom.  Family noticed a new blister wound on his ankle.  His temperature was up in the 99s which is high for him.  Wife states his normal temperature is 97.  He has not been eating well.  SHe brought him in for further evaluation bc he had sepsis in the past and his immune system is weak.    Past Medical History:  Diagnosis Date  . Abnormality of gait 09/22/2015  . Arthritis   . Atrial fibrillation ([place])   . CAD (coronary artery disease)    Stent to RCA, Penta stent, 99% reduced to 0% 2002.  . Cancer ([place])    skin CA removed from back  . Chronic insomnia 03/30/2015  . Complication of anesthesia    trouble waking up  . Constipation    from medications taken  . GERD (gastroesophageal reflux disease)   . Hypercholesteremia   . Hypertension   . Hypothyroidism   . Memory difficulty 09/22/2015  . Osteoarthritis   . Seizures ([place])   . Sepsis ([place]) 05/2017  . Transient alteration of awareness 03/30/2015  . Vertigo    hx of    Patient Active Problem List   Diagnosis Date Noted  . Persistent atrial fibrillation 11/10/2017  . Coronary artery disease involving native coronary artery of native heart without angina pectoris 11/10/2017  . Severe muscle deconditioning 11/10/2017  . Hemorrhage 10/22/2017  . CAD S/P percutaneous coronary angioplasty 10/22/2017  . Acute hypoxemic respiratory failure ([place]) 07/17/2017  . Aspiration syndrome, subsequent encounter 07/11/2017  . Aspiration pneumonia ([place]) 07/01/2017  . Acute encephalopathy 07/01/2017  . Thrombocytopenia ([place]) 07/01/2017  .  Chronic diastolic (congestive) heart failure ([place]) 07/01/2017  . Anemia of chronic disease 07/01/2017  . Troponin level elevated 07/01/2017  . Chronic atrial fibrillation 07/01/2017  . Goals of care, counseling/discussion   . Palliative care by specialist   . Acute metabolic encephalopathy 30/86/5784  . CKD (chronic kidney disease) stage 3, GFR 30-59 ml/min (HCC)   . CAP (community acquired pneumonia) 04/17/2017  . Atrial fibrillation with RVR ([place]) 04/17/2017  . Hallux rigidus, right foot 03/10/2016  . Lumbosacral spondylosis without myelopathy 10/29/2015  . Memory difficulty 09/22/2015  . Abnormality of gait 09/22/2015  . Hyperglycemia 07/06/2015  . Chronic pain 07/06/2015  . Chronic insomnia 03/30/2015  . Transient alteration of awareness 03/30/2015  . Abnormal liver function   . Altered mental status 01/31/2015  . Essential hypertension 01/31/2015  . Constipation 01/31/2015  . Hypothyroidism 01/31/2015  . Seizure disorder ([place]) 01/31/2015  . Bladder outlet obstruction 01/31/2015  . GERD (gastroesophageal reflux disease) 01/31/2015  . Chronic back pain 01/31/2015  . Acute kidney injury ([place]) 01/31/2015    Past Surgical History:  Procedure Laterality Date  . BACK SURGERY    . EYE SURGERY     Bilateral Cataract surgery   . HERNIA REPAIR    . I&D EXTREMITY Right 05/10/2015   Procedure: IRRIGATION AND DEBRIDEMENT EXTREMITY;  Surgeon: Leanora Cover, MD;  Location: [facility];  Service: Orthopedics;  Laterality: Right;  . KNEE ARTHROPLASTY     right knee X 2; left knee once  . LAMINECTOMY     X 6  . POSTERIOR CERVICAL FUSION/FORAMINOTOMY  01/28/2012   Procedure: POSTERIOR CERVICAL FUSION/FORAMINOTOMY LEVEL 3;  Surgeon: [name], MD;  Location: [facility];  Service: Neurosurgery;  Laterality: Left;  Posterior Cervical Five-Thoracic One Fusion, Arthrodesis with LEFT Cervical Seven-thoracic One Laminectomy, Foraminotomy and Resection of Synovial Cyst  .  POSTERIOR CERVICAL FUSION/FORAMINOTOMY N/A 01/29/2013   Procedure: POSTERIOR CERVICAL FUSION/FORAMINOTOMY LEVEL 1 and C2-5 Posteriolateral Arthrodesis;  Surgeon: [name], MD;  Location: [facility];  Service: Neurosurgery;  Laterality: N/A;  C2-C3 Laminectomy C2-C3 posterior cervical arthrodesis  . TONSILLECTOMY          Home Medications    Prior to Admission medications   Medication Sig Start Date End Date Taking? Authorizing Provider  acetaminophen (TYLENOL) 325 MG tablet Take 650 mg by mouth every 4 (four) hours as needed for mild pain.   Yes [provider]  alendronate (FOSAMAX) 70 MG tablet Take 70 mg by mouth once a week. On Wednesday. Remain upright for 30-60 minutes. 01/06/12  Yes [provider]  Amino Acids-Protein Hydrolys (FEEDING SUPPLEMENT, PRO-STAT SUGAR FREE 64,) LIQD Take 30 mLs by mouth 2 (two) times daily. 07/05/17  Yes Lavina Hamman, MD  amoxicillin-clavulanate (AUGMENTIN) 875-125 MG tablet Take 1 tablet by mouth 2 (two) times daily. One po bid x 7 days 03/13/18  Yes Daleen Bo, MD  aspirin EC 81 MG tablet Take 1 tablet (81 mg total) by mouth daily. 10/15/17  Yes Barrett, Evelene Croon, PA-C  calcium carbonate (OS-CAL) 600 MG tablet Take 600 mg by mouth daily after breakfast.    Yes [provider]  cyanocobalamin 500 MCG tablet Take 500 mcg by mouth at bedtime.   Yes [provider]  diltiazem (CARDIZEM CD) 300 MG 24 hr capsule TAKE 1 CAPSULE(300 MG) BY MOUTH EVERY MORNING. DO NOT CRUSH Patient taking differently: Take 300 mg by mouth daily.  12/03/17  Yes Croitoru, Mihai, MD  divalproex (DEPAKOTE) 250 MG DR tablet Take 250 mg by mouth every morning.   Yes [provider]  divalproex (DEPAKOTE) 250 MG DR tablet Take 500 mg by mouth every evening.    Yes [provider]  doxazosin (CARDURA) 4 MG tablet Take 4 mg by mouth daily.   Yes [provider]  ferrous sulfate 325 (65 FE) MG tablet Take 1 tablet  (325 mg total) by mouth 2 (two) times daily with a meal. 06/28/17  Yes Skeet Latch, MD  furosemide (LASIX) 40 MG tablet Take 1 tablet (40 mg total) by mouth every morning. Or per cardiologist instructions since patient cannot be weighed 11/28/17  Yes Croitoru, Mihai, MD  HYDROcodone-acetaminophen (NORCO) 10-325 MG tablet Take 1 tablet by mouth every 4 (four) hours as needed for moderate pain.   Yes [name], MD  levothyroxine (SYNTHROID, LEVOTHROID) 150 MCG tablet Take 150 mcg by mouth daily before breakfast. 12/27/11  Yes [provider]  lidocaine (LIDODERM) 5 % Place 1 patch onto the skin daily as needed. Remove & Discard patch within 12 hours or as directed by MD Patient taking differently: Place 1 patch onto the skin daily as needed (pain). Remove & Discard patch within 12 hours or as directed by MD  06/01/17  Yes Mikhail, Velta Addison, DO  metoprolol tartrate (LOPRESSOR) 25 MG tablet Take 1 tablet (25 mg total) by mouth 2 (two)  times daily. Take with or immediately following a meal. 11/06/17  Yes Croitoru, Mihai, MD  Multiple Vitamins-Minerals (CERTAVITE/ANTIOXIDANTS) TABS Take 1 tablet by mouth every evening.    Yes [provider]  omeprazole (PRILOSEC) 20 MG capsule Take 20 mg by mouth daily before breakfast.  12/23/11  Yes [provider]  potassium chloride SA (K-DUR,KLOR-CON) 20 MEQ tablet TAKE 1 TABLET BY MOUTH DAILY ALONG WITH LASIX 40MG  OR AS DIRECTED BY CARDIOLOGIST 03/11/18  Yes Croitoru, Mihai, MD  pravastatin (PRAVACHOL) 40 MG tablet Take 40 mg by mouth at bedtime.  10/29/14  Yes [provider]  predniSONE (DELTASONE) 10 MG tablet Take 1 tablet (10 mg total) by mouth daily with breakfast. 04/28/17  Yes Lavina Hamman, MD  VITAMIN D, ERGOCALCIFEROL, PO Take 5,000 Units by mouth daily.    Yes [provider]  ZETIA 10 MG tablet Take 5 mg by mouth at bedtime. (0.5 tablet) 10/15/11  Yes [provider]  nitroGLYCERIN (NITROSTAT)  0.4 MG SL tablet Place 0.4 mg under the tongue every 5 (five) minutes as needed for chest pain. Max of 3 tablets    [provider]  silodosin (RAPAFLO) 4 MG CAPS capsule Take 1 capsule (4 mg total) by mouth daily with breakfast. Patient not taking: Reported on 03/13/2018 11/08/17   Croitoru, Dani Gobble, MD    Family History Family History  Problem Relation Age of Onset  . Hypertension Mother   . Cancer Mother   . Kidney failure Father   . Heart disease Father     Social History Social History   Tobacco Use  . Smoking status: Former Research scientist (life sciences)  . Smokeless tobacco: Never Used  Substance Use Topics  . Alcohol use: Yes    Comment: rare  . Drug use: No     Allergies   Eliquis [apixaban]; Demerol [meperidine]; and Keppra [levetiracetam]   Review of Systems Review of Systems  All other systems reviewed and are negative.    Physical Exam Updated Vital Signs BP 100/63   Pulse 63   Temp 99 F (37.2 C) (Oral)   Resp 10   Ht 1.753 m (5\' 9" )   Wt 75 kg   SpO2 95%   BMI 24.42 kg/m   Physical Exam Vitals signs and nursing note reviewed.  Constitutional:      General: He is not in acute distress.    Appearance: He is well-developed.  HENT:     Head: Normocephalic and atraumatic.     Right Ear: External ear normal.     Left Ear: External ear normal.  Eyes:     General: No scleral icterus.       Right eye: No discharge.        Left eye: No discharge.     Conjunctiva/sclera: Conjunctivae normal.  Neck:     Musculoskeletal: Neck supple.     Trachea: No tracheal deviation.  Cardiovascular:     Rate and Rhythm: Normal rate. Rhythm regularly irregular.  Pulmonary:     Effort: Pulmonary effort is normal. No respiratory distress.     Breath sounds: Normal breath sounds. No stridor. No wheezing or rales.  Abdominal:     General: Bowel sounds are normal. There is no distension.     Palpations: Abdomen is soft.     Tenderness: There is no abdominal tenderness. There is  no guarding or rebound.  Musculoskeletal:        General: No tenderness.  Skin:    General: Skin is  warm and dry.     Findings: Erythema present. No rash.     Comments: Erythema of the right lower leg, chronic ulcerative wound, blistered wound lateral aspect of lower leg  Neurological:     Mental Status: He is alert.     Cranial Nerves: No cranial nerve deficit (no facial droop, extraocular movements intact, no slurred speech).     Sensory: No sensory deficit.     Motor: No abnormal muscle tone or seizure activity.     Coordination: Coordination normal.      ED Treatments / Results  Labs (all labs ordered are listed, but only abnormal results are displayed) Labs Reviewed  COMPREHENSIVE METABOLIC PANEL - Abnormal; Notable for the following components:      Result Value   Sodium 146 (*)    Glucose, Bld 103 (*)    BUN 38 (*)    Creatinine, Ser 1.68 (*)    Calcium 7.9 (*)    Total Protein 5.7 (*)    Albumin 3.0 (*)    AST 42 (*)    GFR calc non Af Amer 37 (*)    GFR calc Af Amer 43 (*)    All other components within normal limits  CBC WITH DIFFERENTIAL/PLATELET - Abnormal; Notable for the following components:   WBC 11.2 (*)    RBC 2.87 (*)    Hemoglobin 9.3 (*)    HCT 30.5 (*)    MCV 106.3 (*)    Platelets 130 (*)    Neutro Abs 9.9 (*)    Lymphs Abs 0.2 (*)    Abs Immature Granulocytes 0.10 (*)    All other components within normal limits  CULTURE, BLOOD (ROUTINE X 2)  CULTURE, BLOOD (ROUTINE X 2)  URINALYSIS, ROUTINE W REFLEX MICROSCOPIC  I-STAT CG4 LACTIC ACID, ED  I-STAT CG4 LACTIC ACID, ED      Procedures Procedures (including critical care time)  Medications Ordered in ED Medications  sodium chloride 0.9 % bolus 1,000 mL (0 mLs Intravenous Stopped 03/15/18 1720)    Followed by  0.9 %  sodium chloride infusion (1,000 mLs Intravenous New Bag/Given 03/15/18 1544)  piperacillin-tazobactam (ZOSYN) IVPB 3.375 g (0 g Intravenous Stopped 03/15/18 1720)      Initial Impression / Assessment and Plan / ED Course  I have reviewed the triage vital signs and the nursing notes.  Pertinent labs & imaging results that were available during my care of the patient were reviewed by me and considered in my medical decision making (see chart for details).  Clinical Course as of Mar 15 1733  Sat Mar 15, 2018  1725 Reviewed findings with pt.   WBC and Cr elevated.  Decrease in hgb.  Pt is still having episodes of confusion.  Visual hallucinations as well.  Thought he saw a candy bowl in the ED room   [JK]    Clinical Course User Index [JK] Dorie Rank, MD    Pt presents with persistent cellulitis.  Now having episodes of confusion, hallucinations.  No overt sepsis.  Lactic acid is normal. Family is concerned with the worsening symptoms, confusion.  Considering his risk factors and comorbidities will consult for admission.  Final Clinical Impressions(s) / ED Diagnoses   Final diagnoses:  Cellulitis of right lower extremity  Confusion      Dorie Rank, MD 03/15/18 1736

## 2018-03-15 NOTE — H&P (Signed)
History and Physical  SAMPSON SELF OHY:[id number] DOB: Feb 15, 1934 DOA: 03/15/2018  PCP: [name], MD Patient coming from: Home   I have personally briefly reviewed patient's old medical records in [place]   Chief Complaint: Worsening LE erythema.   HPI: CHRLES [name] is a 85 y.o. male with past medical history significant for A. fib only on aspirin, coronary artery disease status post a stent 2002, gait abnormality, hypothyroidism, wound the right lower extremity who presented with worsening right lower extremity redness, pain and edema.  Patient was seen in the emergency department 2 days prior to admission and he was diagnosed with cellulitis of the right lower extremity he was discharged on Augmentin.  He came in after failing oral antibiotics. Per wife patient has not been eating well, poor oral intake.  He has been getting his oral Lasix and orders medications.  Patient has been noted to be confused, some mild delusions.  Evaluation in the emergency department show worsening right lower extremity edema and redness.  White blood cell at 11, worsening kidney function with a creatinine at 1.6.  Sodium at 146.  Thick acid 0.9 hemoglobin 9.3  Review of Systems: All systems reviewed and apart from history of presenting illness, are negative.  Past Medical History:  Diagnosis Date  . Abnormality of gait 09/22/2015  . Arthritis   . Atrial fibrillation ([place])   . CAD (coronary artery disease)    Stent to RCA, Penta stent, 99% reduced to 0% 2002.  . Cancer ([place])    skin CA removed from back  . Chronic insomnia 03/30/2015  . Complication of anesthesia    trouble waking up  . Constipation    from medications taken  . GERD (gastroesophageal reflux disease)   . Hypercholesteremia   . Hypertension   . Hypothyroidism   . Memory difficulty 09/22/2015  . Osteoarthritis   . Seizures ([place])   . Sepsis ([employer]) 05/2017  . Transient alteration of awareness 03/30/2015  . Vertigo     hx of   Past Surgical History:  Procedure Laterality Date  . BACK SURGERY    . EYE SURGERY     Bilateral Cataract surgery   . HERNIA REPAIR    . I&D EXTREMITY Right 05/10/2015   Procedure: IRRIGATION AND DEBRIDEMENT EXTREMITY;  Surgeon: Leanora Cover, MD;  Location: [facility];  Service: Orthopedics;  Laterality: Right;  . KNEE ARTHROPLASTY     right knee X 2; left knee once  . LAMINECTOMY     X 6  . POSTERIOR CERVICAL FUSION/FORAMINOTOMY  01/28/2012   Procedure: POSTERIOR CERVICAL FUSION/FORAMINOTOMY LEVEL 3;  Surgeon: [name], MD;  Location: [facility];  Service: Neurosurgery;  Laterality: Left;  Posterior Cervical Five-Thoracic One Fusion, Arthrodesis with LEFT Cervical Seven-thoracic One Laminectomy, Foraminotomy and Resection of Synovial Cyst  . POSTERIOR CERVICAL FUSION/FORAMINOTOMY N/A 01/29/2013   Procedure: POSTERIOR CERVICAL FUSION/FORAMINOTOMY LEVEL 1 and C2-5 Posteriolateral Arthrodesis;  Surgeon: [name], MD;  Location: [facility];  Service: Neurosurgery;  Laterality: N/A;  C2-C3 Laminectomy C2-C3 posterior cervical arthrodesis  . TONSILLECTOMY     Social History:  reports that he has quit smoking. He has never used smokeless tobacco. He reports current alcohol use. He reports that he does not use drugs.   Allergies  Allergen Reactions  . Eliquis [Apixaban] Other (See Comments)    bleeding  . Demerol [Meperidine] Nausea And Vomiting  . Keppra [Levetiracetam] Other (See Comments)  Causes sleepiness, mental status changes    Family History  Problem Relation Age of Onset  . Hypertension Mother   . Cancer Mother   . Kidney failure Father   . Heart disease Father      Prior to Admission medications   Medication Sig Start Date End Date Taking? Authorizing Provider  acetaminophen (TYLENOL) 325 MG tablet Take 650 mg by mouth every 4 (four) hours as needed for mild pain.   Yes [provider]  alendronate (FOSAMAX) 70  MG tablet Take 70 mg by mouth once a week. On Wednesday. Remain upright for 30-60 minutes. 01/06/12  Yes [provider]  Amino Acids-Protein Hydrolys (FEEDING SUPPLEMENT, PRO-STAT SUGAR FREE 64,) LIQD Take 30 mLs by mouth 2 (two) times daily. 07/05/17  Yes Lavina Hamman, MD  amoxicillin-clavulanate (AUGMENTIN) 875-125 MG tablet Take 1 tablet by mouth 2 (two) times daily. One po bid x 7 days 03/13/18  Yes Daleen Bo, MD  aspirin EC 81 MG tablet Take 1 tablet (81 mg total) by mouth daily. 10/15/17  Yes Barrett, Evelene Croon, PA-C  calcium carbonate (OS-CAL) 600 MG tablet Take 600 mg by mouth daily after breakfast.    Yes [provider]  cyanocobalamin 500 MCG tablet Take 500 mcg by mouth at bedtime.   Yes [provider]  diltiazem (CARDIZEM CD) 300 MG 24 hr capsule TAKE 1 CAPSULE(300 MG) BY MOUTH EVERY MORNING. DO NOT CRUSH Patient taking differently: Take 300 mg by mouth daily.  12/03/17  Yes Croitoru, Mihai, MD  divalproex (DEPAKOTE) 250 MG DR tablet Take 250 mg by mouth every morning.   Yes [provider]  divalproex (DEPAKOTE) 250 MG DR tablet Take 500 mg by mouth every evening.    Yes [provider]  doxazosin (CARDURA) 4 MG tablet Take 4 mg by mouth daily.   Yes [provider]  ferrous sulfate 325 (65 FE) MG tablet Take 1 tablet (325 mg total) by mouth 2 (two) times daily with a meal. 06/28/17  Yes Skeet Latch, MD  furosemide (LASIX) 40 MG tablet Take 1 tablet (40 mg total) by mouth every morning. Or per cardiologist instructions since patient cannot be weighed 11/28/17  Yes Croitoru, Mihai, MD  HYDROcodone-acetaminophen (NORCO) 10-325 MG tablet Take 1 tablet by mouth every 4 (four) hours as needed for moderate pain.   Yes [name], MD  levothyroxine (SYNTHROID, LEVOTHROID) 150 MCG tablet Take 150 mcg by mouth daily before breakfast. 12/27/11  Yes [provider]  lidocaine (LIDODERM) 5 % Place 1 patch onto the skin daily  as needed. Remove & Discard patch within 12 hours or as directed by MD Patient taking differently: Place 1 patch onto the skin daily as needed (pain). Remove & Discard patch within 12 hours or as directed by MD  06/01/17  Yes Mikhail, Velta Addison, DO  metoprolol tartrate (LOPRESSOR) 25 MG tablet Take 1 tablet (25 mg total) by mouth 2 (two) times daily. Take with or immediately following a meal. 11/06/17  Yes Croitoru, Mihai, MD  Multiple Vitamins-Minerals (CERTAVITE/ANTIOXIDANTS) TABS Take 1 tablet by mouth every evening.    Yes [provider]  omeprazole (PRILOSEC) 20 MG capsule Take 20 mg by mouth daily before breakfast.  12/23/11  Yes [provider]  potassium chloride SA (K-DUR,KLOR-CON) 20 MEQ tablet TAKE 1 TABLET BY MOUTH DAILY ALONG WITH LASIX 40MG  OR AS DIRECTED BY CARDIOLOGIST 03/11/18  Yes Croitoru, Mihai, MD  pravastatin (PRAVACHOL) 40 MG tablet Take 40 mg by mouth  at bedtime.  10/29/14  Yes [provider]  predniSONE (DELTASONE) 10 MG tablet Take 1 tablet (10 mg total) by mouth daily with breakfast. 04/28/17  Yes Lavina Hamman, MD  VITAMIN D, ERGOCALCIFEROL, PO Take 5,000 Units by mouth daily.    Yes [provider]  ZETIA 10 MG tablet Take 5 mg by mouth at bedtime. (0.5 tablet) 10/15/11  Yes [provider]  nitroGLYCERIN (NITROSTAT) 0.4 MG SL tablet Place 0.4 mg under the tongue every 5 (five) minutes as needed for chest pain. Max of 3 tablets    [provider]  silodosin (RAPAFLO) 4 MG CAPS capsule Take 1 capsule (4 mg total) by mouth daily with breakfast. Patient not taking: Reported on 03/13/2018 11/08/17   Croitoru, Dani Gobble, MD   Physical Exam: Vitals:   03/15/18 1500 03/15/18 1530 03/15/18 1630 03/15/18 1700  BP: (!) 104/58 105/70 (!) 109/48 100/63  Pulse: 91 84 77 63  Resp: 15 14 11 10   Temp:      TempSrc:      SpO2: 97% 98% 98% 95%  Weight:      Height:         General exam: Moderately built and nourished patient, lying  comfortably supine on the gurney in no obvious distress.  Head, eyes and ENT: Nontraumatic and normocephalic. Pupils equally reacting to light and accommodation. Oral mucosa moist.  Neck: Supple. No JVD, carotid bruit or thyromegaly.  Lymphatics: No lymphadenopathy.  Respiratory system: Clear to auscultation. No increased work of breathing.  Cardiovascular system: S1 and S2 heard, RRR. No JVD, murmurs, gallops, clicks or pedal edema.  Gastrointestinal system: Abdomen is nondistended, soft and nontender. Normal bowel sounds heard. No organomegaly or masses appreciated.  Central nervous system: Alert and oriented to person   Extremities: Peripheral pulses symmetrically felt.  Right lower extremity with significant redness up to below the knee.  He has small skin break ulceration in the right ankle.  He has an open wound in the anterior part of the leg 1 cm round with packing on it.  Skin: No rashes or acute findings.  Musculoskeletal system: Negative exam.  Psychiatry: Pleasant and cooperative.   Labs on Admission:  Basic Metabolic Panel: Recent Labs  Lab 03/13/18 1013 03/15/18 1437  NA 144 146*  K 3.8 4.2  CL 105 110  CO2 28 22  GLUCOSE 101* 103*  BUN 58* 38*  CREATININE 1.56* 1.68*  CALCIUM 8.8* 7.9*   Liver Function Tests: Recent Labs  Lab 03/13/18 1013 03/15/18 1437  AST 19 42*  ALT 16 24  ALKPHOS 45 67  BILITOT 0.7 1.0  PROT 6.3* 5.7*  ALBUMIN 4.0 3.0*   No results for input(s): LIPASE, AMYLASE in the last 168 hours. No results for input(s): AMMONIA in the last 168 hours. CBC: Recent Labs  Lab 03/13/18 1013 03/15/18 1437  WBC 10.8* 11.2*  NEUTROABS 8.5* 9.9*  HGB 11.6* 9.3*  HCT 37.3* 30.5*  MCV 103.6* 106.3*  PLT 134* 130*   Cardiac Enzymes: No results for input(s): CKTOTAL, CKMB, CKMBINDEX, TROPONINI in the last 168 hours.  BNP (last 3 results) Recent Labs    09/30/17 1107 10/22/17 1120 11/22/17 1130  PROBNP 3,272* 3,243* 3,962*    CBG: No results for input(s): GLUCAP in the last 168 hours.  Radiological Exams on Admission: No results found.  EKG: Independently reviewed.  A. fib on the monitor  Assessment/Plan Active Problems:   Hypothyroidism   Seizure disorder (HCC)   GERD (gastroesophageal  reflux disease)   Acute kidney injury ([place])   CKD (chronic kidney disease) stage 3, GFR 30-59 ml/min (HCC)   Acute metabolic encephalopathy   Chronic atrial fibrillation   CAD S/P percutaneous coronary angioplasty   Cellulitis   Hypernatremia   1- Cellulitis of the right lower extremity, associated with open wound. Will start IV vancomycin at risk for MRSA. Wound care consulted. Follow blood cultures.  2-AKI on chronic kidney disease Last creatinine per records 1.4---. Creatinine today at 1.6.. I will hold Lasix. Gentle hydration.  3-Mild hypernatremia; Poor oral intake. Start IV fluids. Hold oral Lasix.  Chronic A. Fib; Systolic blood pressure soft.  Will admit to telemetry. Continue with metoprolol and Cardizem. He is on a baby aspirin for anticoagulation.  Seizure disorder; Continue with Depakote.  Hypothyroidism, Continue with  Synthroid  BPH; Continue with Cardura  Anemia; probably  iron deficiency Daily with ferrous sulfate. Repeat hemoglobin in the morning..  Acute metabolic encephalopathy; Related to infection, dehydration.     DVT Prophylaxis: SCDs for history of bleed on anticoagulation Code Status: Patient is a DNR, but he and wife will want treatment for A. fib with RVR Family Communication: Wife at bedside Disposition Plan: Admit under observation for IV antibiotics and IV fluids reevaluate in the morning.  Time spent: 75 minutes,   Elmarie Shiley MD [facility] Pager [phone]  If 7PM-7AM, please contact night-coverage [email or web address] Password [email or web address]  03/15/2018, 5:49 PM

## 2018-03-15 NOTE — ED Notes (Signed)
Bed: WA05 Expected date:  Expected time:  Means of arrival:  Comments: EMS 

## 2018-03-15 NOTE — ED Notes (Signed)
Pt has been made aware about UA sample. Urinal at bedside.

## 2018-03-16 DIAGNOSIS — L03115 Cellulitis of right lower limb: Secondary | ICD-10-CM | POA: Diagnosis present

## 2018-03-16 DIAGNOSIS — D631 Anemia in chronic kidney disease: Secondary | ICD-10-CM | POA: Diagnosis present

## 2018-03-16 DIAGNOSIS — G40909 Epilepsy, unspecified, not intractable, without status epilepticus: Secondary | ICD-10-CM | POA: Diagnosis present

## 2018-03-16 DIAGNOSIS — E7800 Pure hypercholesterolemia, unspecified: Secondary | ICD-10-CM | POA: Diagnosis present

## 2018-03-16 DIAGNOSIS — M1990 Unspecified osteoarthritis, unspecified site: Secondary | ICD-10-CM | POA: Diagnosis present

## 2018-03-16 DIAGNOSIS — A419 Sepsis, unspecified organism: Secondary | ICD-10-CM | POA: Diagnosis present

## 2018-03-16 DIAGNOSIS — F22 Delusional disorders: Secondary | ICD-10-CM | POA: Diagnosis present

## 2018-03-16 DIAGNOSIS — Z9861 Coronary angioplasty status: Secondary | ICD-10-CM | POA: Diagnosis not present

## 2018-03-16 DIAGNOSIS — E870 Hyperosmolality and hypernatremia: Secondary | ICD-10-CM | POA: Diagnosis present

## 2018-03-16 DIAGNOSIS — E860 Dehydration: Secondary | ICD-10-CM | POA: Diagnosis present

## 2018-03-16 DIAGNOSIS — I130 Hypertensive heart and chronic kidney disease with heart failure and stage 1 through stage 4 chronic kidney disease, or unspecified chronic kidney disease: Secondary | ICD-10-CM | POA: Diagnosis present

## 2018-03-16 DIAGNOSIS — Z66 Do not resuscitate: Secondary | ICD-10-CM | POA: Diagnosis present

## 2018-03-16 DIAGNOSIS — L03119 Cellulitis of unspecified part of limb: Secondary | ICD-10-CM | POA: Diagnosis not present

## 2018-03-16 DIAGNOSIS — N179 Acute kidney failure, unspecified: Secondary | ICD-10-CM | POA: Diagnosis present

## 2018-03-16 DIAGNOSIS — F5104 Psychophysiologic insomnia: Secondary | ICD-10-CM | POA: Diagnosis present

## 2018-03-16 DIAGNOSIS — D509 Iron deficiency anemia, unspecified: Secondary | ICD-10-CM | POA: Diagnosis present

## 2018-03-16 DIAGNOSIS — G4733 Obstructive sleep apnea (adult) (pediatric): Secondary | ICD-10-CM | POA: Diagnosis present

## 2018-03-16 DIAGNOSIS — I5032 Chronic diastolic (congestive) heart failure: Secondary | ICD-10-CM | POA: Diagnosis present

## 2018-03-16 DIAGNOSIS — N183 Chronic kidney disease, stage 3 (moderate): Secondary | ICD-10-CM | POA: Diagnosis present

## 2018-03-16 DIAGNOSIS — E785 Hyperlipidemia, unspecified: Secondary | ICD-10-CM | POA: Diagnosis present

## 2018-03-16 DIAGNOSIS — I2510 Atherosclerotic heart disease of native coronary artery without angina pectoris: Secondary | ICD-10-CM | POA: Diagnosis present

## 2018-03-16 DIAGNOSIS — I482 Chronic atrial fibrillation, unspecified: Secondary | ICD-10-CM | POA: Diagnosis present

## 2018-03-16 DIAGNOSIS — R410 Disorientation, unspecified: Secondary | ICD-10-CM | POA: Diagnosis present

## 2018-03-16 DIAGNOSIS — Z515 Encounter for palliative care: Secondary | ICD-10-CM | POA: Diagnosis present

## 2018-03-16 DIAGNOSIS — E039 Hypothyroidism, unspecified: Secondary | ICD-10-CM | POA: Diagnosis present

## 2018-03-16 DIAGNOSIS — K219 Gastro-esophageal reflux disease without esophagitis: Secondary | ICD-10-CM | POA: Diagnosis present

## 2018-03-16 DIAGNOSIS — G9341 Metabolic encephalopathy: Secondary | ICD-10-CM | POA: Diagnosis present

## 2018-03-16 LAB — BASIC METABOLIC PANEL
Anion gap: 11 (ref 5–15)
BUN: 41 mg/dL — ABNORMAL HIGH (ref 8–23)
CO2: 21 mmol/L — ABNORMAL LOW (ref 22–32)
Calcium: 7.4 mg/dL — ABNORMAL LOW (ref 8.9–10.3)
Chloride: 110 mmol/L (ref 98–111)
Creatinine, Ser: 1.79 mg/dL — ABNORMAL HIGH (ref 0.61–1.24)
GFR calc Af Amer: 40 mL/min — ABNORMAL LOW (ref >60)
GFR calc non Af Amer: 35 mL/min — ABNORMAL LOW (ref >60)
Glucose, Bld: 106 mg/dL — ABNORMAL HIGH (ref 70–99)
Potassium: 4 mmol/L (ref 3.5–5.1)
Sodium: 142 mmol/L (ref 135–145)

## 2018-03-16 LAB — CBC
HCT: 28.8 % — ABNORMAL LOW (ref 39.0–52.0)
Hemoglobin: 8.9 g/dL — ABNORMAL LOW (ref 13.0–17.0)
MCH: 33.1 pg (ref 26.0–34.0)
MCHC: 30.9 g/dL (ref 30.0–36.0)
MCV: 107.1 fL — ABNORMAL HIGH (ref 80.0–100.0)
Platelets: 133 10*3/uL — ABNORMAL LOW (ref 150–400)
RBC: 2.69 MIL/uL — ABNORMAL LOW (ref 4.22–5.81)
RDW: 14.7 % (ref 11.5–15.5)
WBC: 8.9 10*3/uL (ref 4.0–10.5)
nRBC: 0 % (ref 0.0–0.2)

## 2018-03-16 LAB — URINALYSIS, ROUTINE W REFLEX MICROSCOPIC
Bilirubin Urine: NEGATIVE
Glucose, UA: NEGATIVE mg/dL
Hgb urine dipstick: NEGATIVE
Ketones, ur: 5 mg/dL — AB
Leukocytes, UA: NEGATIVE
Nitrite: NEGATIVE
Protein, ur: NEGATIVE mg/dL
Specific Gravity, Urine: 1.017 (ref 1.005–1.030)
pH: 5 (ref 5.0–8.0)

## 2018-03-16 LAB — MAGNESIUM: Magnesium: 2.1 mg/dL (ref 1.7–2.4)

## 2018-03-16 MED ORDER — PREDNISONE 5 MG PO TABS
5.0000 mg | ORAL_TABLET | Freq: Every day | ORAL | Status: DC
  Administered 2018-03-17 – 2018-03-20 (×4): 5 mg via ORAL
  Filled 2018-03-16 (×4): qty 1

## 2018-03-16 MED ORDER — PIPERACILLIN-TAZOBACTAM 3.375 G IVPB
3.3750 g | Freq: Three times a day (TID) | INTRAVENOUS | Status: DC
  Administered 2018-03-16 – 2018-03-17 (×3): 3.375 g via INTRAVENOUS
  Filled 2018-03-16 (×3): qty 50

## 2018-03-16 NOTE — Progress Notes (Signed)
Pt had 11 beats VT per tele- asymptomatic and VSS. Pt did not notice, but states he did have a "nightmare" around that time. Pt more confused now, calling out "[name]!" at times and says he keeps thinking he is at home. MD Santami aware. Mag ordered. Provolon boots also ordered to keep pressure off of heels and to help keep right ankle dressing in place. Wife extremely meticulous about dressing- changes herself and will not allow any tape to touch pt's skin. Wife requested provolon boots.

## 2018-03-16 NOTE — Progress Notes (Signed)
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 fib diagnosed 04/2017 on Eliquis for chads score 5--had to come off Eliquis because of bleeding- On chronic steroids unknown reason Follows for chronic diastolic heart failure with Dr. Orene Desanctis given extra Lasix if calf diameter is about 14 inches  Hospitalized 5/2-5/10 for acute diastolic heart failure and right pleural effusion and had a thoracentesis showing transudate at that time-Baseline weight seems to be about 160 has a specialized dosing of diuretic based on weight and cuff size  Re-presented to the emergency room after being seen 03/13/2018 with right lower extremity cellulitis and sent home with Augmentin  Found to have poor p.o. intake mild confusion and was hallucinating in the emergency room WBC 11 creatinine 1.6 sodium 146 lactic acid 0.9 hemoglobin 9.3   A & Plan Sepsis likely secondary to cellulitis of lower extremity with chronic wounds-continue vancomycin Zosyn and mark out wound and redness Acute kidney injury-Baseline creatinine 1.3-1.4-holding off diuretics on IV saline 50 cc D5-repeat labs a.m.  Chronic diastolic heart failure with history of prior pleural effusion on admission-Lasix 40 mg on hold at this time given AKI continue metoprolol Cardizem Mild hypernatremia continue D5 at this time Reflux and aspiration risk on dysphagia 3 diet-wife controls diet and on regular diet currently CAD status post PCI 2002-metoprolol, Cardizem, statin and Zetia OSA Chronic A. fib chads score >5-taken off of  anticoagulation because of the left thigh bleeding 07/2017 on prophylactic aspirin only-family understands still has risk of stroke Prior seizure disorder with partial seizure-stable continue Depakote 500 p.m. 2:50 AM Likely anemia chronic disease continue ferrous sulfate 2 tabs twice daily slight drop in hemoglobin from baseline BPH continue Cardura 4 daily Hypothyroidism continue Synthroid 150 mcg daily Reflux-pantoprazole 40 daily Unclear etiology of prednisone use-patient states that has been using this for 10 to 15 years for myalgias and joint pains-we will probably need to de-escalate patient will think about it and let me know    Lovenox Full code at this time long discussion with the family who have decided to continue full CODE STATUS patient himself defers this decision making Inpatient long discussion with wife at the bedside who knows him really well and takes good care of him and manages his wound care at [facility] want to go home when stabilized  Verlon Au, MD  [facility] Direct contact: [phone] --Via amion app OR  --[email or web address]; password [email or web address]  7PM-7AM contact night coverage as above 03/16/2018, 10:45 AM  LOS: 0 days   Consultants:  None  Procedures:  No  Antimicrobials:  Vancomycin and Zosyn   Interval history/Subjective: Awake alert pleasant can tell me year date month and president No distress at current time No chest pain No fever however does have chronic debility   Objective:  Vitals:  Vitals:   03/16/18 0413 03/16/18 0952  BP: 107/63 123/71  Pulse: 93 98  Resp: 16 18  Temp: 98 F (36.7 C) 97.6 F (36.4 C)  SpO2: (!) 89% 92%    Exam:  Awake alert pleasant EOMI NCAT thick neck chest is clear and lateral lung fields S1-S2 no murmur rub  or gallop with some PVCs Abdomen is soft no rebound no guarding Right leg is red erythematous and does have a quarter sized deep area of denudation over the lateral aspect just above the lateral  malleoli over the lateral malleoli however there is some skin denudation and a flap that looks like it burst Neurologically intact moving all 4 limbs he does have right foot drop he is unable to really move much   I have personally reviewed the following:  DATA   Labs:  BUN/creatinine down from 38/1 0.6-40 1/1.7  CO2 21  Lactic acid down from 1.5-0.99  WBC down from 11-8.9 hemoglobin down from 11 on 1/2-8.9 on 1/5   Decision to obtain old records:  Yes  Review and summation of old records:  Yes reviewed extensively  Scheduled Meds: . aspirin EC  81 mg Oral Daily  . calcium carbonate  1 tablet Oral Q breakfast  . diltiazem  300 mg Oral Daily  . divalproex  250 mg Oral Daily  . divalproex  500 mg Oral QHS  . doxazosin  4 mg Oral Daily  . ezetimibe  5 mg Oral QHS  . feeding supplement (PRO-STAT SUGAR FREE 64)  30 mL Oral BID  . ferrous sulfate  325 mg Oral BID WC  . levothyroxine  150 mcg Oral Q0600  . metoprolol tartrate  25 mg Oral BID  . multivitamin with minerals  1 tablet Oral QHS  . pantoprazole  40 mg Oral Daily  . pravastatin  40 mg Oral QHS  . predniSONE  10 mg Oral Q breakfast  . vitamin B-12  500 mcg Oral QHS   Continuous Infusions: . dextrose 50 mL/hr at 03/16/18 0200  . vancomycin      Active Problems:   Hypothyroidism   Seizure disorder (HCC)   GERD (gastroesophageal reflux disease)   Acute kidney injury (HCC)   CKD (chronic kidney disease) stage 3, GFR 30-59 ml/min (HCC)   Acute metabolic encephalopathy   Chronic atrial fibrillation   CAD S/P percutaneous coronary angioplasty   Cellulitis   Hypernatremia   LOS: 0 days

## 2018-03-16 NOTE — Progress Notes (Signed)
Per pt's wife pt wears O2 at home at night when sleeping, wife concerned about his Spo2 levels trending in lower 90's since admission. Per wife pt placed on O2 1L via n.c. Wife stated pt has been "confused, irritable" and "seeing things" today, wife asked if the low level of oxygen is causing it. Advised wife that pt's oxygen saturation levels are not critically low but could be different reasons I.e. infection etc.Will monitor for any new changes and agreed to bring this up to the MD to review. Wife also highly concerned about pt being in fluid overload. Stating he's having a hard time breathing. Pt denies difficulty breathing, pt has some expository wheezing, does not sound wet, a second Nurse, mental health assessed pt as well for a second opinion and agreed does not think pt is in fluid overload. Pt's urine is dark amber in color, discussed MD ordered mild hydration via IV and fluids are only going in at 60ml. Will continue to monitor for any fluid overload. Wife wants to see the same MD tomorrow morning and know when the wound nurse will be in tomorrow on day shift. Wife concerned about pt's wounds and discussed dressing changes, no orders for wound care, pt has Provolon boots on. Pt stable at this time.

## 2018-03-17 ENCOUNTER — Encounter (HOSPITAL_BASED_OUTPATIENT_CLINIC_OR_DEPARTMENT_OTHER): Payer: Self-pay

## 2018-03-17 LAB — BASIC METABOLIC PANEL
Anion gap: 11 (ref 5–15)
BUN: 45 mg/dL — ABNORMAL HIGH (ref 8–23)
CO2: 20 mmol/L — ABNORMAL LOW (ref 22–32)
Calcium: 7.2 mg/dL — ABNORMAL LOW (ref 8.9–10.3)
Chloride: 108 mmol/L (ref 98–111)
Creatinine, Ser: 1.84 mg/dL — ABNORMAL HIGH (ref 0.61–1.24)
GFR calc Af Amer: 39 mL/min — ABNORMAL LOW (ref >60)
GFR calc non Af Amer: 33 mL/min — ABNORMAL LOW (ref >60)
Glucose, Bld: 124 mg/dL — ABNORMAL HIGH (ref 70–99)
Potassium: 4.1 mmol/L (ref 3.5–5.1)
Sodium: 139 mmol/L (ref 135–145)

## 2018-03-17 LAB — CBC WITH DIFFERENTIAL/PLATELET
Abs Immature Granulocytes: 0.06 10*3/uL (ref 0.00–0.07)
Basophils Absolute: 0 10*3/uL (ref 0.0–0.1)
Basophils Relative: 0 %
Eosinophils Absolute: 0.1 10*3/uL (ref 0.0–0.5)
Eosinophils Relative: 1 %
HCT: 29.4 % — ABNORMAL LOW (ref 39.0–52.0)
Hemoglobin: 8.8 g/dL — ABNORMAL LOW (ref 13.0–17.0)
Immature Granulocytes: 1 %
Lymphocytes Relative: 6 %
Lymphs Abs: 0.5 10*3/uL — ABNORMAL LOW (ref 0.7–4.0)
MCH: 31.8 pg (ref 26.0–34.0)
MCHC: 29.9 g/dL — ABNORMAL LOW (ref 30.0–36.0)
MCV: 106.1 fL — ABNORMAL HIGH (ref 80.0–100.0)
Monocytes Absolute: 0.9 10*3/uL (ref 0.1–1.0)
Monocytes Relative: 12 %
Neutro Abs: 6.2 10*3/uL (ref 1.7–7.7)
Neutrophils Relative %: 80 %
Platelets: 139 10*3/uL — ABNORMAL LOW (ref 150–400)
RBC: 2.77 MIL/uL — ABNORMAL LOW (ref 4.22–5.81)
RDW: 14.6 % (ref 11.5–15.5)
WBC: 7.7 10*3/uL (ref 4.0–10.5)
nRBC: 0 % (ref 0.0–0.2)

## 2018-03-17 MED ORDER — METRONIDAZOLE 500 MG PO TABS
500.0000 mg | ORAL_TABLET | Freq: Three times a day (TID) | ORAL | Status: DC
  Administered 2018-03-17 – 2018-03-18 (×3): 500 mg via ORAL
  Filled 2018-03-17 (×3): qty 1

## 2018-03-17 MED ORDER — ENOXAPARIN SODIUM 30 MG/0.3ML ~~LOC~~ SOLN
30.0000 mg | Freq: Every day | SUBCUTANEOUS | Status: DC
  Filled 2018-03-17 (×4): qty 0.3

## 2018-03-17 MED ORDER — IPRATROPIUM-ALBUTEROL 0.5-2.5 (3) MG/3ML IN SOLN: 3.0000 mL | RESPIRATORY_TRACT | Status: DC | PRN

## 2018-03-17 NOTE — Progress Notes (Signed)
At this time patient and spouse refusing lovenox. Education provided for lovenox and patient still refusing at this time. Stating that they had a large amount of blood loss in the past and do not want to take anything that could cause it again.

## 2018-03-17 NOTE — Consult Note (Signed)
   [facility] CM Inpatient Consult   03/17/2018  [name] Aug 20, 1933 [id number]     Patient screened for potential [facility] Care Management services due to unplanned readmission risk score of 37% (extreme).  Went to bedside to speak with [name] about [facility] Management program services. However,he was resting.   Please place [facility] Care Management consult if [place] Management services are needed.     [name], MSN-Ed, RN,BSN [facility] Liaison [phone]

## 2018-03-17 NOTE — Progress Notes (Signed)
Pt present with this RN at bedside briefly after pt's wife left hospital. Pt stated "not sure I will get through this" asked pt if he was referring to his current medical issue (cellulitis). Pt laughed and stated "well that too" Asked pt to be more specific, pt said "I don't know if I should say anything." Advised pt he is in a safe place and he can confide in this RN. Pt asked if his wife was around; informed pt wife is not around and offered to close the room door for privacy. Pt stated that he wish he "wasn't married twice." Pt did not go into any more details at that time. Reminded pt he can confide in this RN due to HIPAA. Pt stated he has a "good wife that takes care" of him but he isn't "sure he can trust her." Pt continued to say "I have not witnessed anything specific but I don't know that I trust her." Pt stated he is "seeing things that aren't there" stating "it is scary and it has not happened before." Per pt "I am scared of her I wish I didn't marry second time." Advised pt this will not be shared with pt's wife but needs to be reported to appropriate staff so we can ensure pt's safety. Pt stated he understood and agreed. Passed information to day shift and charge nurse. At this time pt's wife is back in the room. Order for social work consult.

## 2018-03-17 NOTE — Consult Note (Signed)
[place] Nurse wound consult note Patient receiving care in [place].  Spouse and eldest son, as well as Arts development officer, at bedside. Reason for Consult: "LE wound" Wound type: right lateral malleolus is an unstageable PI.  The entire wound started "days ago" according to the spouse, as a fluid filled "blister" that burst.  The wound area measures 5.2 cm x 3.8 cm with a central area of eschar.  The plan of care for this area is:  Cleanse with normal saline. Pat dry. Apply a hydrocolloid Kellie Simmering # [id number]).  Change every 3 days and prn.  This will promote autolytic debridement. The right lateral lower leg has a wound that measures 2.8 cm x 1.3 cm x 0.9 cm. The wound bed is pink. The [facility] has been using a matrix dressing for this wound that is not part of the [facility] formulary.  Plan of care for this wound:  Cleanse with normal saline. Pat dry. Place a small piece of Aquacel Ag+ Kellie Simmering [id number]) into the wound. Cover with a small foam dressing.  Change daily.  The right leg is erythematous with cellulitis and has some edema. Continue the use of the heel lift boots. Thank you for the consult.  Discussed plan of care with the patient and bedside nurse.  [facility] nurse will not follow at this time.  Please re-consult the [facility] team if needed.  Val Riles, RN, MSN, CWOCN, CNS-BC, pager [phone]

## 2018-03-17 NOTE — Progress Notes (Signed)
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 fib diagnosed 04/2017 on Eliquis for chads score 5--had to come off Eliquis because of bleeding- On chronic steroids unknown reason Follows for chronic diastolic heart failure with Dr. Orene Desanctis given extra Lasix if calf diameter is about 14 inches  Hospitalized 5/2-5/10 for acute diastolic heart failure and right pleural effusion and had a thoracentesis showing transudate at that time-Baseline weight seems to be about 160 has a specialized dosing of diuretic based on weight and cuff size  Re-presented to the emergency room after being seen 03/13/2018 with right lower extremity cellulitis and sent home with Augmentin  Found to have poor p.o. intake mild confusion and was hallucinating in the emergency room WBC 11 creatinine 1.6 sodium 146 lactic acid 0.9 hemoglobin 9.3   A & Plan Sepsis 2/2 cellulitis of lower extremity with chronic wounds continue vancomycin Zosyn and mark out wound and redness Acute kidney injury Baseline creatinine 1.3-1.4-holding off diuretics Chronic diastolic heart failure with history of prior pleural effusion on admission Lasix 40 mg on hold at this time given AKI continue metoprolol Cardizem Keep volume status even Mild hypernatremia-resolved Reflux and aspiration risk on dysphagia 3 diet wife controls diet and on regular diet currently CAD status post PCI 2002-metoprolol, Cardizem, statin and Zetia OSA Chronic A. fib chads score >5 taken off of anticoagulation because of the left thigh bleeding  07/2017 on prophylactic aspirin only-family understands still has risk of stroke Prior seizure disorder with partial seizure stable continue Depakote 500 p.m. 2:50 AM Likely anemia chronic disease continue ferrous sulfate 2 tabs twice daily slight drop in hemoglobin from baseline BPH continue Cardura 4 daily Hypothyroidism continue Synthroid 150 mcg daily Reflux-pantoprazole 40 daily Unclear etiology of prednisone use patient states that has been using this for 10 to 15 years for myalgias and joint pains-we will probably need to de-escalate patient will think about it and let me know      Verlon Au, MD  [facility] Direct contact: [phone] --Via amion app OR  --[email or web address]; password [email or web address]  7PM-7AM contact night coverage as above 03/17/2018, 12:08 PM  LOS: 1 day   Consultants:  None  Procedures:  No  Antimicrobials:  Vancomycin and Zosyn   Interval history/Subjective:  cannot recall wht he did for xmas but does recall more distant events No cp No fever No n/v   Objective:  Vitals:  Vitals:   03/16/18 2044 03/17/18 0451  BP: 127/75 133/67  Pulse: 94 82  Resp: 18 20  Temp: 98.3 F (36.8 C) 98.5 F (36.9 C)  SpO2: 92% 93%    Exam:  Awake alert pleasant EOMI NCAT thick neck  chest is clear and lateral lung fields S1-S2 no murmur rub or gallop with some PVCs Abdomen is soft no rebound no guarding Right leg is less red but wrapped and Prevalon boots on before I saw  I have personally reviewed the following:  DATA   Labs:  BUN/creatinine down from 41/1.7-->45/1.8  CO2 20  Lactic acid down from 1.5-0.99  WBC down from 11-7.7   hemoglobin down from 11 on 1/2-8.9  Decision to obtain old records:  Yes  Review and summation of old records:  Yes reviewed extensively  Scheduled Meds: . aspirin EC  81 mg Oral Daily  . calcium carbonate  1 tablet Oral Q breakfast  . diltiazem  300 mg Oral Daily  . divalproex  250 mg Oral Daily  .  divalproex  500 mg Oral QHS  . doxazosin  4 mg Oral Daily  . enoxaparin (LOVENOX) injection  30 mg Subcutaneous Daily  . ezetimibe  5 mg Oral QHS  . feeding supplement (PRO-STAT SUGAR FREE 64)  30 mL Oral BID  . ferrous sulfate  325 mg Oral BID WC  . levothyroxine  150 mcg Oral Q0600  . metoprolol tartrate  25 mg Oral BID  . multivitamin with minerals  1 tablet Oral QHS  . pantoprazole  40 mg Oral Daily  . pravastatin  40 mg Oral QHS  . predniSONE  5 mg Oral Q breakfast  . vitamin B-12  500 mcg Oral QHS   Continuous Infusions: . dextrose 30 mL/hr at 03/17/18 0402  . piperacillin-tazobactam (ZOSYN)  IV 3.375 g (03/17/18 0718)  . vancomycin 750 mg (03/16/18 2145)    Active Problems:   Hypothyroidism   Seizure disorder (HCC)   GERD (gastroesophageal reflux disease)   Acute kidney injury ([place])   CKD (chronic kidney disease) stage 3, GFR 30-59 ml/min (HCC)   Acute metabolic encephalopathy   Chronic atrial fibrillation   CAD S/P percutaneous coronary angioplasty   Cellulitis   Hypernatremia   LOS: 1 day

## 2018-03-17 NOTE — Progress Notes (Signed)
Pt's wife still concerned about pt being in fluid overload, and concerned regarding confusion stating r/t to low Spo2. Discussed pt does not sound like he's in fluid overload and pt confusion could be r/t to different things. Wife still not appeased and is brining the same issues up. On call provider paged, new orders in. Fransico Him, RN

## 2018-03-18 LAB — RENAL FUNCTION PANEL
Albumin: 2.7 g/dL — ABNORMAL LOW (ref 3.5–5.0)
Anion gap: 9 (ref 5–15)
BUN: 44 mg/dL — ABNORMAL HIGH (ref 8–23)
CO2: 23 mmol/L (ref 22–32)
Calcium: 7.5 mg/dL — ABNORMAL LOW (ref 8.9–10.3)
Chloride: 106 mmol/L (ref 98–111)
Creatinine, Ser: 1.6 mg/dL — ABNORMAL HIGH (ref 0.61–1.24)
GFR calc Af Amer: 46 mL/min — ABNORMAL LOW (ref >60)
GFR calc non Af Amer: 40 mL/min — ABNORMAL LOW (ref >60)
Glucose, Bld: 88 mg/dL (ref 70–99)
Phosphorus: 3.6 mg/dL (ref 2.5–4.6)
Potassium: 4 mmol/L (ref 3.5–5.1)
Sodium: 138 mmol/L (ref 135–145)

## 2018-03-18 LAB — CBC WITH DIFFERENTIAL/PLATELET
Abs Immature Granulocytes: 0.13 10*3/uL — ABNORMAL HIGH (ref 0.00–0.07)
Basophils Absolute: 0 10*3/uL (ref 0.0–0.1)
Basophils Relative: 0 %
Eosinophils Absolute: 0.1 10*3/uL (ref 0.0–0.5)
Eosinophils Relative: 2 %
HCT: 29.6 % — ABNORMAL LOW (ref 39.0–52.0)
Hemoglobin: 9.1 g/dL — ABNORMAL LOW (ref 13.0–17.0)
Immature Granulocytes: 2 %
Lymphocytes Relative: 9 %
Lymphs Abs: 0.6 10*3/uL — ABNORMAL LOW (ref 0.7–4.0)
MCH: 32 pg (ref 26.0–34.0)
MCHC: 30.7 g/dL (ref 30.0–36.0)
MCV: 104.2 fL — ABNORMAL HIGH (ref 80.0–100.0)
Monocytes Absolute: 1 10*3/uL (ref 0.1–1.0)
Monocytes Relative: 14 %
Neutro Abs: 5.1 10*3/uL (ref 1.7–7.7)
Neutrophils Relative %: 73 %
Platelets: 152 10*3/uL (ref 150–400)
RBC: 2.84 MIL/uL — ABNORMAL LOW (ref 4.22–5.81)
RDW: 14.6 % (ref 11.5–15.5)
WBC: 7 10*3/uL (ref 4.0–10.5)
nRBC: 0 % (ref 0.0–0.2)

## 2018-03-18 LAB — CULTURE, BLOOD (ROUTINE X 2)
Culture: NO GROWTH
Culture: NO GROWTH

## 2018-03-18 MED ORDER — DOXYCYCLINE HYCLATE 100 MG PO TABS
100.0000 mg | ORAL_TABLET | Freq: Two times a day (BID) | ORAL | Status: DC
  Administered 2018-03-18 – 2018-03-20 (×5): 100 mg via ORAL
  Filled 2018-03-18 (×5): qty 1

## 2018-03-18 NOTE — Progress Notes (Signed)
Pt called nurse to room to ask if I could contact financial services in the hospital and freeze his account because he fears his wife is going to "take all of his money".  He became very defensive when questioned about his suspicions and wanted to contact the police.  I contacted social work to further explore his accusations.  Pt insisted that I look up his attorney's phone number which I did.  He didn't equivocally state that he was afraid for his safety, but more for his financial well being.  Will continue to monitor.

## 2018-03-18 NOTE — Progress Notes (Signed)
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 fib diagnosed 04/2017 on Eliquis for chads score 5--had to come off Eliquis because of bleeding- On chronic steroids unknown reason Follows for chronic diastolic heart failure with Dr. Orene Desanctis given extra Lasix if calf diameter is about 14 inches  Hospitalized 5/2-5/10 for acute diastolic heart failure and right pleural effusion and had a thoracentesis showing transudate at that time-Baseline weight seems to be about 160 has a specialized dosing of diuretic based on weight and cuff size  Re-presented to the emergency room after being seen 03/13/2018 with right lower extremity cellulitis and sent home with Augmentin  Found to have poor p.o. intake mild confusion and was hallucinating in the emergency room on 1/4 and was readmitted with presumed outpatient failure of cellulitis AKI WBC 11 creatinine 1.6 sodium 146 lactic acid 0.9 hemoglobin 9.3   A & Plan  Sepsis 2/2 cellulitis of RLE- Burst Bleb over lateral malleolus-ulcer over mid calf region on lateral aspect is very clean Rubor reduced transitioned from IV Vanco Flagyl to p.o. doxycycline 1/8-watch for fevers but if stabilizes within the next 24 to 48 hours without fever may be able to discharge back to home Acute kidney injury Baseline creatinine 1.3-1.4-holding off diuretics Improved slightly today Chronic diastolic heart failure with history of prior pleural effusion on admission Lasix 40 mg on hold 2/2 AKI continue metoprolol Cardizem Mild  hypernatremia-resolved Reflux and aspiration risk on dysphagia 3 diet wife controls diet and on regular diet currently CAD status post PCI 2002-metoprolol, Cardizem, statin and Zetia OSA Supposed to be on oxygen 2 L at night continue at bedtime Chronic A. fib chads score >5 taken off of anticoagulation because of the left thigh bleeding 07/2017 on prophylactic aspirin only-family/patient also refusing Lovenox-it has been explained to them risk of DVT Prior seizure disorder with partial seizure stable continue Depakote 500 and 250 mg p.m.  Likely anemia chronic disease continue ferrous sulfate 2 tabs twice daily slight drop in hemoglobin from baseline BPH continue Cardura 4 daily Hypothyroidism continue Synthroid 150 mcg daily Reflux-pantoprazole 40 daily Unspecified polymyalgias Previously on prednisone 10-amenable to cutting back to 5 mg-explained risk of wound worsening      Verlon Au, MD  [facility] Direct contact: [phone] --Via amion app OR  --[email or web address]; password [email or web address]  7PM-7AM contact night coverage as above 03/18/2018, 2:22 PM  LOS: 2 days   Consultants:  None  Procedures:  No  Antimicrobials:  Vancomycin and Zosyn-->7 cefepime vancomycin-->currently on p.o. doxycycline   Interval history/Subjective:  Much more clear coherent and spontaneous in responses sitting in chair Nursing reports refusing Lovenox No chest pain No fever Eating some   Objective:  Vitals:  Vitals:   03/18/18 0445 03/18/18 1326  BP: 104/63 130/69  Pulse: 94 97  Resp: 18 18  Temp: 99 F (37.2 C) (!) 97.4 F (36.3 C)  SpO2: 92% 95%    Exam:  Awake alert pleasant EOMI NCAT thick neck  chest is clear and lateral lung fields  S1-S2 no murmur rub Right lower extremity erythema has decreased significantly although wounds are wrapped and wound nurse  will be seeing her again tomorrow so I did not open dressings  I have personally reviewed the following:   DATA   Labs:  BUN/creatinine down from 41/1.7-->45/1.8-->44/1.6  CO2 20  Lactic acid down from 1.5-0.99  WBC down from 11-7.7-->7.0   hemoglobin stable in the 9 range   Decision to obtain old records:  Yes  Review and summation of old records:  Yes reviewed extensively  Scheduled Meds: . aspirin EC  81 mg Oral Daily  . calcium carbonate  1 tablet Oral Q breakfast  . diltiazem  300 mg Oral Daily  . divalproex  250 mg Oral Daily  . divalproex  500 mg Oral QHS  . doxazosin  4 mg Oral Daily  . doxycycline  100 mg Oral Q12H  . enoxaparin (LOVENOX) injection  30 mg Subcutaneous Daily  . ezetimibe  5 mg Oral QHS  . feeding supplement (PRO-STAT SUGAR FREE 64)  30 mL Oral BID  . ferrous sulfate  325 mg Oral BID WC  . levothyroxine  150 mcg Oral Q0600  . metoprolol tartrate  25 mg Oral BID  . multivitamin with minerals  1 tablet Oral QHS  . pantoprazole  40 mg Oral Daily  . pravastatin  40 mg Oral QHS  . predniSONE  5 mg Oral Q breakfast  . vitamin B-12  500 mcg Oral QHS   Continuous Infusions:   Active Problems:   Hypothyroidism   Seizure disorder (HCC)   GERD (gastroesophageal reflux disease)   Acute kidney injury ([place])   CKD (chronic kidney disease) stage 3, GFR 30-59 ml/min (HCC)   Acute metabolic encephalopathy   Chronic atrial fibrillation   CAD S/P percutaneous coronary angioplasty   Cellulitis   Hypernatremia   LOS: 2 days

## 2018-03-19 DIAGNOSIS — Z9861 Coronary angioplasty status: Secondary | ICD-10-CM

## 2018-03-19 DIAGNOSIS — I2510 Atherosclerotic heart disease of native coronary artery without angina pectoris: Secondary | ICD-10-CM

## 2018-03-19 DIAGNOSIS — N179 Acute kidney failure, unspecified: Secondary | ICD-10-CM

## 2018-03-19 DIAGNOSIS — L03119 Cellulitis of unspecified part of limb: Secondary | ICD-10-CM

## 2018-03-19 LAB — CBC WITH DIFFERENTIAL/PLATELET
Abs Immature Granulocytes: 0.31 10*3/uL — ABNORMAL HIGH (ref 0.00–0.07)
Basophils Absolute: 0 10*3/uL (ref 0.0–0.1)
Basophils Relative: 0 %
Eosinophils Absolute: 0.1 10*3/uL (ref 0.0–0.5)
Eosinophils Relative: 2 %
HCT: 29.2 % — ABNORMAL LOW (ref 39.0–52.0)
Hemoglobin: 9 g/dL — ABNORMAL LOW (ref 13.0–17.0)
Immature Granulocytes: 4 %
Lymphocytes Relative: 11 %
Lymphs Abs: 0.7 10*3/uL (ref 0.7–4.0)
MCH: 31.9 pg (ref 26.0–34.0)
MCHC: 30.8 g/dL (ref 30.0–36.0)
MCV: 103.5 fL — ABNORMAL HIGH (ref 80.0–100.0)
Monocytes Absolute: 1.2 10*3/uL — ABNORMAL HIGH (ref 0.1–1.0)
Monocytes Relative: 17 %
Neutro Abs: 4.6 10*3/uL (ref 1.7–7.7)
Neutrophils Relative %: 66 %
Platelets: 169 10*3/uL (ref 150–400)
RBC: 2.82 MIL/uL — ABNORMAL LOW (ref 4.22–5.81)
RDW: 14.5 % (ref 11.5–15.5)
WBC: 7 10*3/uL (ref 4.0–10.5)
nRBC: 0 % (ref 0.0–0.2)

## 2018-03-19 LAB — RENAL FUNCTION PANEL
Albumin: 2.6 g/dL — ABNORMAL LOW (ref 3.5–5.0)
Anion gap: 8 (ref 5–15)
BUN: 39 mg/dL — ABNORMAL HIGH (ref 8–23)
CO2: 22 mmol/L (ref 22–32)
Calcium: 7.8 mg/dL — ABNORMAL LOW (ref 8.9–10.3)
Chloride: 109 mmol/L (ref 98–111)
Creatinine, Ser: 1.24 mg/dL (ref 0.61–1.24)
GFR calc Af Amer: >60 mL/min (ref >60)
GFR calc non Af Amer: 54 mL/min — ABNORMAL LOW (ref >60)
Glucose, Bld: 83 mg/dL (ref 70–99)
Phosphorus: 3.1 mg/dL (ref 2.5–4.6)
Potassium: 4 mmol/L (ref 3.5–5.1)
Sodium: 139 mmol/L (ref 135–145)

## 2018-03-19 MED ORDER — PREDNISONE 5 MG PO TABS: 5.0000 mg | ORAL_TABLET | Freq: Every day | ORAL | 0 refills | Status: DC

## 2018-03-19 MED ORDER — CEPHALEXIN 500 MG PO CAPS: 500.0000 mg | ORAL_CAPSULE | Freq: Three times a day (TID) | ORAL | 0 refills | Status: DC

## 2018-03-19 MED ORDER — LACTINEX PO CHEW: 1.0000 | CHEWABLE_TABLET | Freq: Three times a day (TID) | ORAL | 0 refills | Status: DC

## 2018-03-19 MED ORDER — PREDNISONE 5 MG PO TABS: 5.0000 mg | ORAL_TABLET | Freq: Every day | ORAL | 1 refills | Status: DC

## 2018-03-19 MED ORDER — HYDROCODONE-ACETAMINOPHEN 10-325 MG PO TABS: 1.0000 | ORAL_TABLET | ORAL | 0 refills | Status: DC | PRN

## 2018-03-19 MED ORDER — DOXYCYCLINE HYCLATE 100 MG PO TABS: 100.0000 mg | ORAL_TABLET | Freq: Two times a day (BID) | ORAL | 0 refills | Status: DC

## 2018-03-19 MED ORDER — CEPHALEXIN 500 MG PO CAPS
500.0000 mg | ORAL_CAPSULE | Freq: Three times a day (TID) | ORAL | Status: DC
  Administered 2018-03-19 – 2018-03-20 (×3): 500 mg via ORAL
  Filled 2018-03-19 (×3): qty 1

## 2018-03-19 NOTE — Progress Notes (Signed)
Clinical Social Worker  Received consult to speak to patient about his safety concerns. Per previous RN note patient expressed to RN that he does not feel safe around his wife. Patient has also verbalized to RN and other staff that he regretted marrying his wife. CSW met patient at bedside to discuss his concerns. Patient was sitting in his recliner and was excited to see CSW. Patient stated he is scared that his wife is going to take the million dollars out of his account before he can divorce her. Patient requested that CSW freeze his account to make sure patients wife does not have access to it. CSW explained her role in patients care and stated that CSW will not be able to access patients financial accounts to freeze it. Patient requested that CSW find the number to [employer] so patient can call bank. CSW stepped out of patients room to find information.   CSW went back into patient room and patients RN Pam came in with CSW. CSW gave patient the information he requested on his bank. CSW asked if he felt his wife was a danger to him. Patient was unable to answer directly stating that he needs to make sure "his wife does not take his money'. Patient stated he should have never married again and that he would like a divorce. Patient then stated he wants all of his accounts frozen. CSW and RN attempted to ask patient again if he felt safe around his wife but patient did not answer question directly. Patient stated he would like the number to his lawyer. RN stated she will look it up for him. CSW signing off, if staff has anymore concerns about patients safety please re-consult CSW.   Rhea Pink, MSW,  [place]

## 2018-03-19 NOTE — NC FL2 (Signed)
[place] LEVEL OF CARE SCREENING TOOL     IDENTIFICATION  Patient Name: [name] Birthdate: 11-03-1933 Sex: male Admission Date (Current Location): 03/15/2018  [facility] and [place] Number:  Herbalist and Address:  [facility],  [place] [address] [place]      Provider Number: [id number]  Attending Physician Name and Address:  Roxan Hockey, MD  Relative Name and Phone Number:  River Ambrosio    Current Level of Care: Hospital Recommended Level of Care: [place] Prior Approval Number:    Date Approved/Denied:   PASRR Number: [id number]  Discharge Plan:      Current Diagnoses: Patient Active Problem List   Diagnosis Date Noted  . Cellulitis 03/15/2018  . Hypernatremia 03/15/2018  . Persistent atrial fibrillation 11/10/2017  . Coronary artery disease involving native coronary artery of native heart without angina pectoris 11/10/2017  . Severe muscle deconditioning 11/10/2017  . Hemorrhage 10/22/2017  . CAD S/P percutaneous coronary angioplasty 10/22/2017  . Acute hypoxemic respiratory failure ([place]) 07/17/2017  . Aspiration syndrome, subsequent encounter 07/11/2017  . Aspiration pneumonia ([place]) 07/01/2017  . Acute encephalopathy 07/01/2017  . Thrombocytopenia ([place]) 07/01/2017  . Chronic diastolic (congestive) heart failure ([place]) 07/01/2017  . Anemia of chronic disease 07/01/2017  . Troponin level elevated 07/01/2017  . Chronic atrial fibrillation 07/01/2017  . Goals of care, counseling/discussion   . Palliative care by specialist   . Acute metabolic encephalopathy 83/38/2505  . CKD (chronic kidney disease) stage 3, GFR 30-59 ml/min (HCC)   . CAP (community acquired pneumonia) 04/17/2017  . Atrial fibrillation with RVR ([place]) 04/17/2017  . Hallux rigidus, right foot 03/10/2016  . Lumbosacral spondylosis without myelopathy 10/29/2015  . Memory difficulty 09/22/2015  . Abnormality of gait  09/22/2015  . Hyperglycemia 07/06/2015  . Chronic pain 07/06/2015  . Chronic insomnia 03/30/2015  . Transient alteration of awareness 03/30/2015  . Abnormal liver function   . Altered mental status 01/31/2015  . Essential hypertension 01/31/2015  . Constipation 01/31/2015  . Hypothyroidism 01/31/2015  . Seizure disorder ([place]) 01/31/2015  . Bladder outlet obstruction 01/31/2015  . GERD (gastroesophageal reflux disease) 01/31/2015  . Chronic back pain 01/31/2015  . Acute kidney injury ([place]) 01/31/2015    Orientation RESPIRATION BLADDER Height & Weight     Self, Time, Situation, Place  Normal Continent Weight: 165 lb 5.5 oz (75 kg) Height:  5\' 9"  (175.3 cm)  BEHAVIORAL SYMPTOMS/MOOD NEUROLOGICAL BOWEL NUTRITION STATUS      Continent Diet(regular)  AMBULATORY STATUS COMMUNICATION OF NEEDS Skin   Limited Assist Verbally Other (Comment)(wound on r leg)                       Personal Care Assistance Level of Assistance  Bathing, Feeding, Dressing Bathing Assistance: Limited assistance Feeding assistance: Independent Dressing Assistance: Limited assistance     Functional Limitations Info  Sight, Hearing, Speech Sight Info: Adequate Hearing Info: Adequate Speech Info: Adequate    SPECIAL CARE FACTORS FREQUENCY  PT (By licensed PT), OT (By licensed OT)     PT Frequency: 5x wk OT Frequency: 5x wk            Contractures Contractures Info: Not present    Additional Factors Info  Code Status, Allergies Code Status Info: Full Code Allergies Info: ELIQUIS APIXABAN, DEMEROL MEPERIDINE, KEPPRA LEVETIRACETAM           Current Medications (03/19/2018):  This is the current hospital active medication  list Current Facility-Administered Medications  Medication Dose Route Frequency Provider Last Rate Last Dose  . acetaminophen (TYLENOL) tablet 650 mg  650 mg Oral Q4H PRN Regalado, Belkys A, MD      . aspirin EC tablet 81 mg  81 mg Oral Daily Regalado, Belkys A, MD   81  mg at 03/19/18 0908  . bisacodyl (DULCOLAX) EC tablet 5 mg  5 mg Oral Daily PRN Regalado, Belkys A, MD      . calcium carbonate (OS-CAL - dosed in mg of elemental calcium) tablet 500 mg of elemental calcium  1 tablet Oral Q breakfast Regalado, Belkys A, MD   500 mg of elemental calcium at 03/19/18 0907  . diltiazem (CARDIZEM CD) 24 hr capsule 300 mg  300 mg Oral Daily Regalado, Belkys A, MD   300 mg at 03/19/18 0906  . divalproex (DEPAKOTE) DR tablet 250 mg  250 mg Oral Daily Regalado, Belkys A, MD   250 mg at 03/19/18 0907  . divalproex (DEPAKOTE) DR tablet 500 mg  500 mg Oral QHS Regalado, Belkys A, MD   500 mg at 03/18/18 2158  . doxazosin (CARDURA) tablet 4 mg  4 mg Oral Daily Regalado, Belkys A, MD   4 mg at 03/19/18 0906  . doxycycline (VIBRA-TABS) tablet 100 mg  100 mg Oral Q12H Nita Sells, MD   100 mg at 03/19/18 0909  . enoxaparin (LOVENOX) injection 30 mg  30 mg Subcutaneous Daily Samtani, Jai-Gurmukh, MD      . ezetimibe (ZETIA) tablet 5 mg  5 mg Oral QHS Regalado, Belkys A, MD   5 mg at 03/18/18 2157  . feeding supplement (PRO-STAT SUGAR FREE 64) liquid 30 mL  30 mL Oral BID Regalado, Belkys A, MD   30 mL at 03/19/18 0909  . ferrous sulfate tablet 325 mg  325 mg Oral BID WC Regalado, Belkys A, MD   325 mg at 03/19/18 0908  . HYDROcodone-acetaminophen (NORCO) 10-325 MG per tablet 1 tablet  1 tablet Oral Q4H PRN Regalado, Belkys A, MD   1 tablet at 03/19/18 1422  . ipratropium-albuterol (DUONEB) 0.5-2.5 (3) MG/3ML nebulizer solution 3 mL  3 mL Nebulization Q4H PRN Bodenheimer, Charles A, NP      . levothyroxine (SYNTHROID, LEVOTHROID) tablet 150 mcg  150 mcg Oral Q0600 Regalado, Belkys A, MD   150 mcg at 03/19/18 2409  . lidocaine (LIDODERM) 5 % 1 patch  1 patch Transdermal Daily PRN Regalado, Belkys A, MD      . metoprolol tartrate (LOPRESSOR) tablet 25 mg  25 mg Oral BID Regalado, Belkys A, MD   25 mg at 03/19/18 0908  . multivitamin with minerals tablet 1 tablet  1 tablet Oral  QHS Regalado, Belkys A, MD   1 tablet at 03/18/18 2157  . nitroGLYCERIN (NITROSTAT) SL tablet 0.4 mg  0.4 mg Sublingual Q5 min PRN Regalado, Belkys A, MD      . pantoprazole (PROTONIX) EC tablet 40 mg  40 mg Oral Daily Regalado, Belkys A, MD   40 mg at 03/19/18 0908  . polyethylene glycol (MIRALAX / GLYCOLAX) packet 17 g  17 g Oral Daily PRN Regalado, Belkys A, MD      . pravastatin (PRAVACHOL) tablet 40 mg  40 mg Oral QHS Regalado, Belkys A, MD   40 mg at 03/18/18 2157  . predniSONE (DELTASONE) tablet 5 mg  5 mg Oral Q breakfast Nita Sells, MD   5 mg at 03/19/18 0908  . vitamin B-12 (CYANOCOBALAMIN)  tablet 500 mcg  500 mcg Oral QHS Regalado, Belkys A, MD   500 mcg at 03/18/18 2157     Discharge Medications: Please see discharge summary for a list of discharge medications.  Relevant Imaging Results:  Relevant Lab Results:   Additional Information SS#: [id number]  Wende Neighbors, LCSW

## 2018-03-19 NOTE — Care Management Note (Signed)
Case Management Note  Patient Details  Name: [name] MRN: [id number] Date of Birth: 04/04/1933  Subjective/Objective:PT recc SNF. Patient declines SNF, but spouse wants SNF-explained to spouse that we cannot make the patient go to SNF. CM did explain the benefit of SNF vs HHC. Alvis Lemmings is the [facility] agency rep [name] following,unable to provide Home 1st program but [facility] they can provide. CSW notified of PT recc SNF,& patient's response.    MD aware.                 Action/Plan:d/c plan home vs SNF.   Expected Discharge Date:  03/20/18               Expected Discharge Plan:  [place]  In-House Referral:     Discharge planning Services  CM Consult  Post Acute Care Choice:  Home Health([facility]) Choice offered to:  Spouse  DME Arranged:    DME Agency:     HH Arranged:  RN, PT, OT, Nurse's Aide, Social Work [employer] Agency:  [place]  Status of Service:  In process, will continue to follow  If discussed at Long Length of Stay Meetings, dates discussed:    Additional Comments:  [name], RN 03/19/2018, 4:15 PM

## 2018-03-19 NOTE — Care Management Note (Signed)
Case Management Note  Patient Details  Name: [name] MRN: [id number] Date of Birth: 1934/01/19  Subjective/Objective:  [facility] used in the past per spouse-they want to use again-rep [facility] aware of HHRN/HHPT ordered. Await d/c order. Patient may d/c by PTAR-forms in shadow chart if needed-nsg can call PTAR when ready.                  Action/Plan:dc home w/HHC.   Expected Discharge Date:                  Expected Discharge Plan:  [place]  In-House Referral:     Discharge planning Services  CM Consult  Post Acute Care Choice:  Home Health([facility]) Choice offered to:  Spouse  DME Arranged:    DME Agency:     HH Arranged:  RN, PT HH Agency:  [place]  Status of Service:  In process, will continue to follow  If discussed at Long Length of Stay Meetings, dates discussed:    Additional Comments:  [name], RN 03/19/2018, 2:16 PM

## 2018-03-19 NOTE — Care Management Important Message (Signed)
Important Message  Patient Details  Name: [name] MRN: [id number] Date of Birth: December 11, 1933   Medicare Important Message Given:  Yes    Kerin Salen 03/19/2018, 11:39 AMImportant Message  Patient Details  Name: [name] MRN: [id number] Date of Birth: 06/19/1933   Medicare Important Message Given:  Yes    Kerin Salen 03/19/2018, 11:38 AM

## 2018-03-19 NOTE — Progress Notes (Signed)
When tech performed vitals O2 sats 92% on room air.  RN spot checked O2 sats at 2300 sats 97% on room air.  Pt did not require oxygen therapy.

## 2018-03-19 NOTE — Discharge Instructions (Signed)
1) follow-up with wound clinic within a week of discharge 2) take medications including antibiotics as prescribed 3) keep bilateral lower extremities elevated 4) follow-up with PCP for recheck in 1 to 2 weeks 5)   The plan of care for this area is:  Cleanse with normal saline. Pat dry. Apply a hydrocolloid Kellie Simmering # [id number]).  Change every 3 days and prn.  This will promote autolytic debridement. Plan of care for this wound:  Cleanse with normal saline. Pat dry. Place a small piece of Aquacel Ag+ Kellie Simmering [phone]) into the wound. Cover with a small foam dressing.  Change daily.  The right leg is erythematous with cellulitis and has some edema. Continue the use of the heel lift boots.

## 2018-03-19 NOTE — Progress Notes (Signed)
Received call from Central Telemetry stating pt had had 6 beats of VT vs Wide QRS complex.  Reviewed strip looks more like Wide QRS.  Pt has CBC ordered for am but not BMP, or mag level.  Dr. Hilbert Bible notified.

## 2018-03-19 NOTE — Discharge Summary (Signed)
[name], is a 85 y.o. male  DOB 12-24-1933  MRN [id number].  Admission date:  03/15/2018  Admitting Physician  Elmarie Shiley, MD  Discharge Date:  03/19/2018   Primary MD  [name], MD  Recommendations for primary care physician for things to follow:   1) follow-up with wound clinic within a week of discharge 2) take medications including antibiotics as prescribed 3) keep bilateral lower extremities elevated 4) follow-up with PCP for recheck in 1 to 2 weeks 5) The plan of care for this area is:  Cleanse with normal saline. Pat dry. Apply a hydrocolloid Kellie Simmering # 652).  Change every 3 days and prn.  This will promote autolytic debridement.  Plan of care for this wound:  Cleanse with normal saline. Pat dry. Place a small piece of Aquacel Ag+ Kellie Simmering [phone]) into the wound. Cover with a small foam dressing.  Change daily.  The right leg is erythematous with cellulitis and has some edema. Continue the use of the heel lift boots.   Admission Diagnosis  Confusion [R41.0] Cellulitis of right lower extremity [L03.115] Cellulitis [L03.90]   Discharge Diagnosis  Confusion [R41.0] Cellulitis of right lower extremity [L03.115] Cellulitis [L03.90]    Active Problems:   Hypothyroidism   Seizure disorder (HCC)   GERD (gastroesophageal reflux disease)   Acute kidney injury (HCC)   CKD (chronic kidney disease) stage 3, GFR 30-59 ml/min (HCC)   Acute metabolic encephalopathy   Chronic atrial fibrillation   CAD S/P percutaneous coronary angioplasty   Cellulitis   Hypernatremia      Past Medical History:  Diagnosis Date  . Abnormality of gait 09/22/2015  . Arthritis   . Atrial fibrillation ([place])   . CAD (coronary artery disease)    Stent to RCA, Penta stent, 99% reduced to 0% 2002.  . Cancer ([place])    skin CA removed from back  . Chronic insomnia 03/30/2015  . Complication of anesthesia    trouble waking up  . Constipation    from medications taken  . GERD (gastroesophageal reflux disease)   . Hypercholesteremia   . Hypertension   . Hypothyroidism   . Memory difficulty 09/22/2015  . Osteoarthritis   . Seizures ([place])   . Sepsis ([place]) 05/2017  . Transient alteration of awareness 03/30/2015  . Vertigo    hx of    Past Surgical History:  Procedure Laterality Date  . BACK SURGERY    . EYE SURGERY     Bilateral Cataract surgery   . HERNIA REPAIR    . I&D EXTREMITY Right 05/10/2015   Procedure: IRRIGATION AND DEBRIDEMENT EXTREMITY;  Surgeon: Leanora Cover, MD;  Location: [facility];  Service: Orthopedics;  Laterality: Right;  . KNEE ARTHROPLASTY     right knee X 2; left knee once  . LAMINECTOMY     X 6  . POSTERIOR CERVICAL FUSION/FORAMINOTOMY  01/28/2012   Procedure: POSTERIOR CERVICAL FUSION/FORAMINOTOMY LEVEL 3;  Surgeon: [name], MD;  Location: [facility];  Service: Neurosurgery;  Laterality: Left;  Posterior Cervical Five-Thoracic One Fusion, Arthrodesis with LEFT Cervical Seven-thoracic One Laminectomy, Foraminotomy and Resection of Synovial Cyst  . POSTERIOR CERVICAL FUSION/FORAMINOTOMY N/A 01/29/2013   Procedure: POSTERIOR CERVICAL FUSION/FORAMINOTOMY LEVEL 1 and C2-5 Posteriolateral Arthrodesis;  Surgeon: [name], MD;  Location: [facility];  Service: Neurosurgery;  Laterality: N/A;  C2-C3 Laminectomy C2-C3 posterior cervical arthrodesis  . TONSILLECTOMY         HPI  from the history and physical done on the day of admission:    Chief Complaint: Worsening LE erythema.   HPI: [name] is a 85 y.o. male with past medical history significant for A. fib only on aspirin, coronary artery disease status post a stent 2002, gait abnormality, hypothyroidism, wound the right lower extremity who presented with worsening right lower extremity redness, pain and edema.  Patient was seen in the emergency department 2 days prior to  admission and he was diagnosed with cellulitis of the right lower extremity he was discharged on Augmentin.  He came in after failing oral antibiotics. Per wife patient has not been eating well, poor oral intake.  He has been getting his oral Lasix and orders medications.  Patient has been noted to be confused, some mild delusions.  Evaluation in the emergency department show worsening right lower extremity edema and redness.  White blood cell at 11, worsening kidney function with a creatinine at 1.6.  Sodium at 146.  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 fib diagnosed 04/2017 on Eliquis for chads score 5--had to come off Eliquis because of bleeding- On chronic steroids unknown reason Follows for chronic diastolic heart failure with Dr. Orene Desanctis given extra Lasix if calf diameter is about 14 inches  Hospitalized 5/2-5/10 for acute diastolic heart failure and right pleural effusion and had a thoracentesis showing transudate at that time-Baseline weight seems to be about 160 has a specialized dosing of diuretic based on weight and cuff size  Re-presented to the emergency room after being seen 03/13/2018 with right lower extremity cellulitis and sent home with Augmentin  Found to have poor p.o. intake mild confusion and was hallucinating in the emergency room on 1/4 and was readmitted with presumed outpatient failure of cellulitis AKI WBC 11 creatinine 1.6 sodium 146 lactic acid 0.9 hemoglobin 9.3   A & Plan  Sepsis 2/2 cellulitis of RLE- Burst Bleb over lateral malleolus-ulcer over mid calf region on lateral aspect is very  clean Treated with IV Vanco, Zosyn and  Flagyl,  Ok to d/c Keflex and po doxycycline   Acute kidney injury Baseline creatinine 1.3-1.4-  Improved to 1.24 from peak of 2.87  Chronic diastolic heart failure with history of prior pleural effusion on admission Resume Lasix 40 mg , continue metoprolol Cardizem  Mild hypernatremia-resolved  Reflux and aspiration risk on dysphagia 3 diet wife has adjusted  diet and on regular diet currently C/n Protonix  CAD status post PCI 2002-metoprolol, Cardizem, statin and Zetia  OSA Supposed to be on oxygen 2 L at night continue at bedtime  Chronic A. fib chads score >5 taken off of anticoagulation because of the left thigh bleeding 07/2017 ,  now on prophylactic aspirin only-family/patient also refusing  Full anti-coagulation  Prior seizure disorder with partial seizure stable -continue Depakote 500 and 250 mg p.m.   Likely anemia chronic disease continue ferrous sulfate 2 tabs twice daily slight drop in hemoglobin from baseline  BPH continue Cardura 4 daily  Hypothyroidism continue Synthroid 150 mcg daily  Unspecified polymyalgias Previously on prednisone 10-amenable to cutting back to 5 mg-explained risk of poor wound healing with steroids  Legs Wounds--- POA 1) follow-up with wound clinic within a week of discharge 2) take medications including antibiotics as prescribed 3) keep bilateral lower extremities elevated 4) follow-up with PCP for recheck in 1 to 2 weeks 5) The wound area measures 5.2 cm x 3.8 cm with a central area of eschar.  The plan of care for this area is:  Cleanse with normal saline. Pat dry. Apply a hydrocolloid Kellie Simmering # 652).  Change every 3 days and prn.  This will promote autolytic debridement. The right lateral lower leg has a wound that measures 2.8 cm x 1.3 cm x 0.9 cm. The wound bed is pink. The [place] has been using a matrix dressing for this wound that is not part of the [facility] formulary.  Plan of care for  this wound:  Cleanse with normal saline. Pat dry. Place a small piece of Aquacel Ag+ Kellie Simmering [phone]) into the wound. Cover with a small foam dressing.  Change daily.  The right leg is erythematous with cellulitis and has some edema. Continue the use of the heel lift boots. Home health RN for wound care and wound care clinic visit advised   Discharge Condition: stable  Follow UP  Follow-up Information    [facility].   Specialty:  Home Health Services Why:  [facility] nursing/physical therapy Contact information: [place] [address] [phone]            Consults obtained -  Diet and Activity recommendation:  As advised  Discharge Instructions     Discharge Instructions    Call MD for:  difficulty breathing, headache or visual disturbances   Complete by:  As directed    Call MD for:  persistant dizziness or light-headedness   Complete by:  As directed    Call MD for:  persistant nausea and vomiting   Complete by:  As directed    Call MD for:  temperature >100.4   Complete by:  As directed    Diet - low sodium heart healthy   Complete by:  As directed    Discharge instructions   Complete by:  As directed    1) follow-up with wound clinic within a week of discharge 2) take medications including antibiotics as prescribed 3) keep bilateral lower extremities elevated 4) follow-up with PCP for recheck in 1 to 2 weeks 5)Home  Wound Care Cleanse with normal saline. Pat dry. Apply a hydrocolloid Kellie Simmering # 652).  Change every 3 days and prn.  This will promote autolytic debridement. The right lateral lower leg has a wound that measures 2.8 cm x 1.3 cm x 0.9 cm. The wound bed is pink.    Plan of care for this wound:  Cleanse with normal saline. Pat dry. Place a small piece of Aquacel Ag+ Kellie Simmering [phone]) into the wound. Cover with a small foam dressing.  Change daily.  Continue the use of the heel lift boots.   Increase activity slowly    Complete by:  As directed  Discharge Medications     Allergies as of 03/19/2018      Reactions   Eliquis [apixaban] Other (See Comments)   bleeding   Demerol [meperidine] Nausea And Vomiting   Keppra [levetiracetam] Other (See Comments)   Causes sleepiness, mental status changes      Medication List    STOP taking these medications   amoxicillin-clavulanate 875-125 MG tablet Commonly known as:  AUGMENTIN     TAKE these medications   acetaminophen 325 MG tablet Commonly known as:  TYLENOL Take 650 mg by mouth every 4 (four) hours as needed for mild pain.   alendronate 70 MG tablet Commonly known as:  FOSAMAX Take 70 mg by mouth once a week. On Wednesday. Remain upright for 30-60 minutes.   aspirin EC 81 MG tablet Take 1 tablet (81 mg total) by mouth daily.   calcium carbonate 600 MG tablet Commonly known as:  OS-CAL Take 600 mg by mouth daily after breakfast.   cephALEXin 500 MG capsule Commonly known as:  KEFLEX Take 1 capsule (500 mg total) by mouth 3 (three) times daily for 7 days.   CERTAVITE/ANTIOXIDANTS Tabs Take 1 tablet by mouth every evening.   diltiazem 300 MG 24 hr capsule Commonly known as:  CARDIZEM CD TAKE 1 CAPSULE(300 MG) BY MOUTH EVERY MORNING. DO NOT CRUSH What changed:  See the new instructions.   divalproex 250 MG DR tablet Commonly known as:  DEPAKOTE Take 250 mg by mouth every morning.   divalproex 250 MG DR tablet Commonly known as:  DEPAKOTE Take 500 mg by mouth every evening.   doxazosin 4 MG tablet Commonly known as:  CARDURA Take 4 mg by mouth daily.   doxycycline 100 MG tablet Commonly known as:  VIBRA-TABS Take 1 tablet (100 mg total) by mouth 2 (two) times daily for 7 days.   feeding supplement (PRO-STAT SUGAR FREE 64) Liqd Take 30 mLs by mouth 2 (two) times daily.   ferrous sulfate 325 (65 FE) MG tablet Take 1 tablet (325 mg total) by mouth 2 (two) times daily with a meal.   furosemide 40 MG tablet Commonly  known as:  LASIX Take 1 tablet (40 mg total) by mouth every morning. Or per cardiologist instructions since patient cannot be weighed   HYDROcodone-acetaminophen 10-325 MG tablet Commonly known as:  NORCO Take 1 tablet by mouth every 4 (four) hours as needed for moderate pain.   lactobacillus acidophilus & bulgar chewable tablet Chew 1 tablet by mouth 3 (three) times daily with meals.   levothyroxine 150 MCG tablet Commonly known as:  SYNTHROID, LEVOTHROID Take 150 mcg by mouth daily before breakfast.   lidocaine 5 % Commonly known as:  LIDODERM Place 1 patch onto the skin daily as needed. Remove & Discard patch within 12 hours or as directed by MD What changed:  reasons to take this   metoprolol tartrate 25 MG tablet Commonly known as:  LOPRESSOR Take 1 tablet (25 mg total) by mouth 2 (two) times daily. Take with or immediately following a meal.   nitroGLYCERIN 0.4 MG SL tablet Commonly known as:  NITROSTAT Place 0.4 mg under the tongue every 5 (five) minutes as needed for chest pain. Max of 3 tablets   omeprazole 20 MG capsule Commonly known as:  PRILOSEC Take 20 mg by mouth daily before breakfast.   potassium chloride SA 20 MEQ tablet Commonly known as:  K-DUR,KLOR-CON TAKE 1 TABLET BY MOUTH DAILY ALONG WITH LASIX 40MG  OR AS DIRECTED BY CARDIOLOGIST  pravastatin 40 MG tablet Commonly known as:  PRAVACHOL Take 40 mg by mouth at bedtime.   predniSONE 5 MG tablet Commonly known as:  DELTASONE Take 1 tablet (5 mg total) by mouth daily with breakfast. What changed:    medication strength  how much to take   vitamin B-12 500 MCG tablet Commonly known as:  CYANOCOBALAMIN Take 500 mcg by mouth at bedtime.   VITAMIN D (ERGOCALCIFEROL) PO Take 5,000 Units by mouth daily.   ZETIA 10 MG tablet Generic drug:  ezetimibe Take 5 mg by mouth at bedtime. (0.5 tablet)       Major procedures and Radiology Reports - PLEASE review detailed and final reports for all  details, in brief -     Dg Chest 2 View  Result Date: 03/13/2018 CLINICAL DATA:  Sepsis. EXAM: CHEST - 2 VIEW COMPARISON:  Radiograph Jul 15, 2017. FINDINGS: Stable cardiomediastinal silhouette. Stable elevated left hemidiaphragm is noted with minimal left basilar subsegmental atelectasis. No pneumothorax or significant pleural effusion is noted. Right lung is clear. Bony thorax is unremarkable. IMPRESSION: Stable elevated left hemidiaphragm with minimal left basilar subsegmental atelectasis. Electronically Signed   By: Marijo Conception, M.D.   On: 03/13/2018 11:29   Dg Ankle Complete Right  Result Date: 03/13/2018 CLINICAL DATA:  Right ankle wound. EXAM: RIGHT ANKLE - COMPLETE 3+ VIEW COMPARISON:  Radiographs of May 23, 2017. FINDINGS: There is no evidence of fracture, dislocation, or joint effusion. There is no evidence of arthropathy or other focal bone abnormality. Vascular calcifications are noted. Probable soft tissue ulceration is seen overlying distal right fibula. No lytic destruction is seen to suggest osteomyelitis. IMPRESSION: Probable soft tissue ulceration seen overlying distal right fibula. No significant bony abnormality is noted. Electronically Signed   By: Marijo Conception, M.D.   On: 03/13/2018 11:31    Micro Results   Recent Results (from the past 240 hour(s))  Culture, blood (Routine x 2)     Status: None   Collection Time: 03/13/18 10:13 AM  Result Value Ref Range Status   Specimen Description   Final    BLOOD BLOOD RIGHT FOREARM Performed at [facility] [address], [place], [place] [address]    Special Requests   Final    BOTTLES DRAWN AEROBIC AND ANAEROBIC Blood Culture results may not be optimal due to an excessive volume of blood received in culture bottles Performed at [facility] [address] [place], [place] [address]    Culture   Final    NO GROWTH 5 DAYS Performed at [facility], [place] [address]  [place], [place] [address]    Report Status 03/18/2018 FINAL  Final  Culture, blood (Routine x 2)     Status: None   Collection Time: 03/13/18 10:13 AM  Result Value Ref Range Status   Specimen Description   Final    BLOOD RIGHT HAND Performed at [facility] [address], [place], [place] [address]    Special Requests   Final    BOTTLES DRAWN AEROBIC AND ANAEROBIC Blood Culture results may not be optimal due to an excessive volume of blood received in culture bottles Performed at [facility] [address], [place], [place] [address]    Culture   Final    NO GROWTH 5 DAYS Performed at [facility], [place] [address], [place], [place] [address]    Report Status 03/18/2018 FINAL  Final  Urine culture     Status: None   Collection Time: 03/13/18  1:10 PM  Result Value Ref Range Status   Specimen Description   Final    URINE, RANDOM Performed at [facility], [place] [address], [place], [place] [address]    Special Requests   Final    NONE Performed at [facility], [place] [address] [place], [place] [address]    Culture   Final    NO GROWTH Performed at [facility], [place] [address] [place], [place] [address]    Report Status 03/15/2018 FINAL  Final  Blood culture (routine x 2)     Status: None (Preliminary result)   Collection Time: 03/15/18  3:15 PM  Result Value Ref Range Status   Specimen Description   Final    BLOOD FATTY CASTS Performed at [facility] [address] [place], [place] [address]    Special Requests   Final    BOTTLES DRAWN AEROBIC AND ANAEROBIC Blood Culture adequate volume Performed at [facility] [address] [place], [place] [address]    Culture   Final    NO GROWTH 4 DAYS Performed at [facility], [place] [address], [place], [place] [address]    Report Status PENDING  Incomplete  Blood culture (routine x 2)      Status: None (Preliminary result)   Collection Time: 03/15/18  3:20 PM  Result Value Ref Range Status   Specimen Description   Final    BLOOD FATTY CASTS Performed at [facility] [address], [place], [place] [address]    Special Requests   Final    BOTTLES DRAWN AEROBIC ONLY Blood Culture adequate volume Performed at [facility] [address], [place], [place] [address]    Culture   Final    NO GROWTH 4 DAYS Performed at [facility], [place] [address] [place], [place] [address]    Report Status PENDING  Incomplete       Today   Subjective    Fabyan Loughmiller today has no new concerns, wife at bedside, questions answered          Patient has been seen and examined prior to discharge   Objective   Blood pressure (!) 146/68, pulse 85, temperature 97.9 F (36.6 C), temperature source Oral, resp. rate 18, height 5\' 9"  (1.753 m), weight 75 kg, SpO2 92 %.   Intake/Output Summary (Last 24 hours) at 03/19/2018 1500 Last data filed at 03/19/2018 1042 Gross per 24 hour  Intake 600 ml  Output 500 ml  Net 100 ml    Exam Gen:- Awake Alert, no acute distress  HEENT:- .AT, No sclera icterus Neck-Supple Neck,No JVD,.  Lungs-  CTAB , good air movement bilaterally  CV- S1, S2 normal, regular Abd-  +ve B.Sounds, Abd Soft, No tenderness,    Extremity/Skin:-Good pulses , see photo below----erythema swelling warmth and tenderness has improved significantly compared to previously marked areas, patient and wife confirms significant improvement in right lower extremity erythema, warmth, tenderness and swelling-please see photos below taking on day of discharge--- no prior photos available from this admission Psych-affect is appropriate, oriented x3 Neuro-no new focal deficits, no tremors   Media Information   Document Information   Photos    03/19/2018 11:15  Attached To:  Hospital Encounter on 03/15/18  Source Information    Roxan Hockey, MD  [facility] Telemetry  Media Information   Document Information   Photos    03/19/2018 11:14  Attached To:  Hospital Encounter on 03/15/18  Source Information   Roxan Hockey, MD  [facility] Telemetry       Data Review   CBC w Diff:  Lab Results  Component Value Date   WBC 7.0 03/19/2018   HGB 9.0 (L) 03/19/2018   HGB 12.6 (L) 11/22/2017   HCT 29.2 (L) 03/19/2018   HCT 37.1 (L) 11/22/2017   PLT 169 03/19/2018   PLT 154 11/22/2017   LYMPHOPCT 11 03/19/2018   MONOPCT 17 03/19/2018   EOSPCT 2 03/19/2018   BASOPCT 0 03/19/2018    CMP:  Lab Results  Component Value Date   NA 139 03/19/2018   NA 143 01/10/2018   K 4.0 03/19/2018   CL 109 03/19/2018   CO2 22 03/19/2018   BUN 39 (H) 03/19/2018   BUN 47 (H) 01/10/2018   CREATININE 1.24 03/19/2018   PROT 5.7 (L) 03/15/2018   PROT 5.7 (L) 01/10/2018   ALBUMIN 2.6 (L) 03/19/2018   ALBUMIN 4.1 11/22/2017   BILITOT 1.0 03/15/2018   BILITOT 0.5 11/22/2017   ALKPHOS 67 03/15/2018   AST 42 (H) 03/15/2018   ALT 24 03/15/2018  .   Total Discharge time is about 33 minutes  Roxan Hockey M.D on 03/19/2018 at 3:00 PM  Pager---[phone]  Go to [email or web address] - password [email or web address] for contact info  [facility] - Office  [phone]

## 2018-03-19 NOTE — Evaluation (Signed)
Physical Therapy Evaluation Patient Details Name: [name] MRN: [id number] DOB: 04-16-1933 Today's Date: 03/19/2018   History of Present Illness  85 yo male admitted with R LE celllulitis. Hx of Afib, CAD, R LE, CHF, gait abnormality, vertigo, Sz, cervical fusion 2013/2014, memory deficits, R LE wound  Clinical Impression  On eval, pt required Min assist +2 for safety/equipment. He walked ~10 feet with a RW. Pt refused to ambulate any further. He is weak and fatigues easily. Discussed d/c plan-he stated he does not wish to go to a rehab. He prefers to go home. Wife was in hallway observed session. She feels he needs to go to rehab and she stated she will have a discussion with him. Will continue to follow and progress activity as pt will allow.     Follow Up Recommendations SNF(if pt will agree. If he refuses placement, then HHPT and 24 hour supervision/assist)    Equipment Recommendations  None recommended by PT    Recommendations for Other Services       Precautions / Restrictions Precautions Precautions: Fall Restrictions Weight Bearing Restrictions: No      Mobility  Bed Mobility Overal bed mobility: Needs Assistance Bed Mobility: Supine to Sit     Supine to sit: Min assist     General bed mobility comments: Assist for R LE. Increased time.   Transfers Overall transfer level: Needs assistance Equipment used: Rolling walker (2 wheeled) Transfers: Sit to/from Stand Sit to Stand: Min assist;From elevated surface         General transfer comment: Assist to rise, stabilize, control descent. VCs safety, technique hand placement.   Ambulation/Gait Ambulation/Gait assistance: Min assist;+2 safety/equipment Gait Distance (Feet): 10 Feet Assistive device: Rolling walker (2 wheeled) Gait Pattern/deviations: Trunk flexed;Step-through pattern     General Gait Details: Slow gait speed. Assist to stabilize pt throughout distance. Followed closely with recliner.    Stairs            Wheelchair Mobility    Modified Rankin (Stroke Patients Only)       Balance Overall balance assessment: Needs assistance         Standing balance support: Bilateral upper extremity supported Standing balance-Leahy Scale: Poor                               Pertinent Vitals/Pain Pain Assessment: Faces Faces Pain Scale: Hurts even more Pain Location: R LE Pain Descriptors / Indicators: Discomfort;Sore Pain Intervention(s): Limited activity within patient's tolerance    Home Living Family/patient expects to be discharged to:: Unsure Living Arrangements: Spouse/significant other Available Help at Discharge: Family Type of Home: House Home Access: Ramped entrance     Home Layout: One level Home Equipment: Environmental consultant - 4 wheels;Cane - single point;Bedside commode;Shower seat;Grab bars - tub/shower;Wheelchair - Banker      Prior Function Level of Independence: Needs assistance   Gait / Transfers Assistance Needed: was walking with RW  ADL's / Homemaking Assistance Needed: assistance for bathing, dressing        Hand Dominance        Extremity/Trunk Assessment   Upper Extremity Assessment Upper Extremity Assessment: Generalized weakness    Lower Extremity Assessment Lower Extremity Assessment: Generalized weakness    Cervical / Trunk Assessment Cervical / Trunk Assessment: Normal  Communication   Communication: No difficulties  Cognition Arousal/Alertness: Awake/alert Behavior During Therapy: WFL for tasks assessed/performed Overall Cognitive Status: Within Functional Limits for tasks  assessed                                        General Comments      Exercises     Assessment/Plan    PT Assessment Patient needs continued PT services  PT Problem List Decreased strength;Decreased range of motion;Decreased balance;Decreased mobility;Pain;Decreased activity tolerance;Decreased  knowledge of use of DME       PT Treatment Interventions DME instruction;Therapeutic activities;Gait training;Functional mobility training;Balance training;Patient/family education;Therapeutic exercise    PT Goals (Current goals can be found in the Care Plan section)  Acute Rehab PT Goals Patient Stated Goal: home! per pt. wife wants rehab. PT Goal Formulation: With patient/family Time For Goal Achievement: 04/02/18 Potential to Achieve Goals: Fair    Frequency Min 3X/week   Barriers to discharge        Co-evaluation               AM-PAC PT "6 Clicks" Mobility  Outcome Measure Help needed turning from your back to your side while in a flat bed without using bedrails?: A Little Help needed moving from lying on your back to sitting on the side of a flat bed without using bedrails?: A Little Help needed moving to and from a bed to a chair (including a wheelchair)?: A Little Help needed standing up from a chair using your arms (e.g., wheelchair or bedside chair)?: A Little Help needed to walk in hospital room?: A Little Help needed climbing 3-5 steps with a railing? : A Lot 6 Click Score: 17    End of Session Equipment Utilized During Treatment: Gait belt Activity Tolerance: Patient limited by fatigue;Patient limited by pain Patient left: in chair;with call bell/phone within reach   PT Visit Diagnosis: Muscle weakness (generalized) (M62.81);Unsteadiness on feet (R26.81);Difficulty in walking, not elsewhere classified (R26.2);Pain Pain - Right/Left: Right Pain - part of body: Leg    Time: 3244-0102 PT Time Calculation (min) (ACUTE ONLY): 16 min   Charges:   PT Evaluation $PT Eval Moderate Complexity: [place], PT Acute Rehabilitation Services Pager: [phone] Office: [phone]

## 2018-03-20 DIAGNOSIS — G9341 Metabolic encephalopathy: Secondary | ICD-10-CM

## 2018-03-20 LAB — CULTURE, BLOOD (ROUTINE X 2)
Culture: NO GROWTH
Culture: NO GROWTH
Special Requests: ADEQUATE
Special Requests: ADEQUATE

## 2018-03-20 NOTE — Plan of Care (Signed)
Plan of care reviewed and discussed with the patient and his son. Verbalized u/o same.

## 2018-03-20 NOTE — Progress Notes (Signed)
Patient Demographics:    [name], is a 85 y.o. male, DOB - 1933/09/29, AQT:[id number]  Admit date - 03/15/2018   Admitting Physician Elmarie Shiley, MD  Outpatient Primary MD for the patient is [name], MD  LOS - 4   Chief Complaint  Patient presents with  . Cellulitis        Subjective:    [name] today has no fevers, no emesis,  No chest pain, wife at bedside, questions answered, patient was originally discharged on 03/19/2018, however patient was awaiting possible insurance approval to go to SNF rehab, at this time patient declines SNF rehab, patient and family want to go home with home health services, please see full discharge summary dated 03/19/2018  Assessment  & Plan :    Active Problems:   Hypothyroidism   Seizure disorder (HCC)   GERD (gastroesophageal reflux disease)   Acute kidney injury ([place])   CKD (chronic kidney disease) stage 3, GFR 30-59 ml/min (HCC)   Acute metabolic encephalopathy   Chronic atrial fibrillation   CAD S/P percutaneous coronary angioplasty   Cellulitis   Hypernatremia  Summary patient was originally discharged on 03/19/2018, however patient was awaiting possible insurance approval to go to SNF rehab, at this time patient declines SNF rehab, patient and family want to go home with home health services, please see full discharge summary dated 03/19/2018  Plan Sepsis 2/2 cellulitis ofRLE- Burst Bleb over lateral malleolus-ulcer over mid calf region on lateral aspect is very clean Treated with IV Vanco, Zosyn and  Flagyl,  Ok to d/c Keflex and po doxycycline   Acute kidney injury Baseline creatinine 1.3-1.4-  Improved to 1.24 from peak of 5.62  Chronic diastolic heart failure with history of prior pleural effusion on admission Resume Lasix 40 mg , continue metoprolol Cardizem  Mild hypernatremia-resolved  Reflux and aspiration risk on  dysphagia 3 diet wife has adjusted  diet and on regular diet currently C/n Protonix  CAD status post PCI 2002-metoprolol, Cardizem, statin and Zetia  OSA Supposed to be on oxygen 2 L at night continue at bedtime  Chronic A. fib chads score >5 taken off of anticoagulation because of the left thigh bleeding 07/2017 , now on prophylactic aspirin only-family/patient also refusing  Full anti-coagulation  Prior seizure disorder with partial seizure stable -continue Depakote 500and 250 mgp.m.   Likely anemia chronic disease continue ferrous sulfate 2 tabs twice daily slight drop in hemoglobin from baseline  BPH continue Cardura 4 daily  Hypothyroidism continue Synthroid 150 mcg daily  Unspecified polymyalgias Previously on prednisone 10-amenable to cutting back to 5 mg-explained risk of poor wound healing with steroids  Legs Wounds--- POA 1) follow-up with wound clinic within a week of discharge 2) take medications including antibiotics as prescribed 3) keep bilateral lower extremities elevated 4) follow-up with PCP for recheck in 1 to 2 weeks 5) The wound area measures 5.2 cm x 3.8 cm with a central area of eschar.  The plan of care for this area is:  Cleanse with normal saline. Pat dry. Apply a hydrocolloid Kellie Simmering # [id number]).  Change every 3 days and prn.  This will promote autolytic debridement. The right lateral lower leg has a wound that measures 2.8 cm x 1.3  cm x 0.9 cm. The wound bed is pink. The [place] has been using a matrix dressing for this wound that is not part of the [facility] formulary.  Plan of care for this wound:  Cleanse with normal saline. Pat dry. Place a small piece of Aquacel Ag+ Kellie Simmering [phone]) into the wound. Cover with a small foam dressing.  Change daily.  The right leg is erythematous with cellulitis and has some edema. Continue the use of the heel lift boots. Home health RN for wound care and wound care clinic visit advised   Discharge Condition:  stable   Code Status : Full  Family Communication:   Wife at bedside   Disposition Plan  : home with [facility]   Lab Results  Component Value Date   PLT 169 03/19/2018    Inpatient Medications  Scheduled Meds: . aspirin EC  81 mg Oral Daily  . calcium carbonate  1 tablet Oral Q breakfast  . cephALEXin  500 mg Oral TID  . diltiazem  300 mg Oral Daily  . divalproex  250 mg Oral Daily  . divalproex  500 mg Oral QHS  . doxazosin  4 mg Oral Daily  . doxycycline  100 mg Oral Q12H  . enoxaparin (LOVENOX) injection  30 mg Subcutaneous Daily  . ezetimibe  5 mg Oral QHS  . feeding supplement (PRO-STAT SUGAR FREE 64)  30 mL Oral BID  . ferrous sulfate  325 mg Oral BID WC  . levothyroxine  150 mcg Oral Q0600  . metoprolol tartrate  25 mg Oral BID  . multivitamin with minerals  1 tablet Oral QHS  . pantoprazole  40 mg Oral Daily  . pravastatin  40 mg Oral QHS  . predniSONE  5 mg Oral Q breakfast  . vitamin B-12  500 mcg Oral QHS   Continuous Infusions: PRN Meds:.acetaminophen, bisacodyl, HYDROcodone-acetaminophen, ipratropium-albuterol, lidocaine, nitroGLYCERIN, polyethylene glycol    Anti-infectives (From admission, onward)   Start     Dose/Rate Route Frequency Ordered Stop   03/19/18 1630  cephALEXin (KEFLEX) capsule 500 mg     500 mg Oral 3 times daily 03/19/18 1601     03/19/18 0000  doxycycline (VIBRA-TABS) 100 MG tablet     100 mg Oral 2 times daily 03/19/18 1441 03/26/18 2359   03/19/18 0000  cephALEXin (KEFLEX) 500 MG capsule     500 mg Oral 3 times daily 03/19/18 1441 03/26/18 2359   03/18/18 1400  doxycycline (VIBRA-TABS) tablet 100 mg     100 mg Oral Every 12 hours 03/18/18 1335     03/17/18 1400  metroNIDAZOLE (FLAGYL) tablet 500 mg  Status:  Discontinued     500 mg Oral Every 8 hours 03/17/18 1223 03/18/18 1335   03/16/18 2000  vancomycin (VANCOCIN) IVPB 750 mg/150 ml premix  Status:  Discontinued     750 mg 150 mL/hr over 60 Minutes Intravenous Every 24 hours  03/15/18 1846 03/18/18 1335   03/16/18 1415  piperacillin-tazobactam (ZOSYN) IVPB 3.375 g  Status:  Discontinued     3.375 g 12.5 mL/hr over 240 Minutes Intravenous Every 8 hours 03/16/18 1407 03/17/18 1223   03/15/18 1845  vancomycin (VANCOCIN) 1,500 mg in sodium chloride 0.9 % 500 mL IVPB     1,500 mg 250 mL/hr over 120 Minutes Intravenous STAT 03/15/18 1835 03/16/18 0100   03/15/18 1515  piperacillin-tazobactam (ZOSYN) IVPB 3.375 g     3.375 g 100 mL/hr over 30 Minutes Intravenous  Once 03/15/18 1513  03/15/18 1720        Objective:   Vitals:   03/19/18 0540 03/19/18 1410 03/19/18 2110 03/20/18 0443  BP: (!) 117/56 (!) 146/68 132/75 (!) 149/79  Pulse: 74 85 80 94  Resp: 16 18 16 16   Temp: 98.8 F (37.1 C) 97.9 F (36.6 C) 99.7 F (37.6 C) 99.4 F (37.4 C)  TempSrc: Oral Oral Oral Oral  SpO2: 96% 92% 93% 96%  Weight:      Height:        Wt Readings from Last 3 Encounters:  03/15/18 75 kg  01/10/18 75 kg  11/08/17 77.4 kg     Intake/Output Summary (Last 24 hours) at 03/20/2018 1501 Last data filed at 03/20/2018 1356 Gross per 24 hour  Intake 360 ml  Output 400 ml  Net -40 ml     Physical Exam Patient is examined daily including today on 03/20/18 , exams remain the same as of yesterday except that has changed   Gen:- Awake Alert, no acute distress  HEENT:- [place].AT, No sclera icterus Neck-Supple Neck,No JVD,.  Lungs-  CTAB , good air movement bilaterally  CV- S1, S2 normal, regular Abd-  +ve B.Sounds, Abd Soft, No tenderness,    Extremity/Skin:-Good pulses , see photo below----erythema swelling warmth and tenderness has improved significantly compared to previously marked areas, patient and wife confirms significant improvement in right lower extremity erythema, warmth, tenderness and swelling-please see photos dated 03/19/2018 in EMR--- no prior photos available from this admission Psych-affect is appropriate, oriented x3 Neuro-no new focal deficits, no tremors     Data Review:   Micro Results Recent Results (from the past 240 hour(s))  Culture, blood (Routine x 2)     Status: None   Collection Time: 03/13/18 10:13 AM  Result Value Ref Range Status   Specimen Description   Final    BLOOD BLOOD RIGHT FOREARM Performed at [facility] [address] [place], [place] [address]    Special Requests   Final    BOTTLES DRAWN AEROBIC AND ANAEROBIC Blood Culture results may not be optimal due to an excessive volume of blood received in culture bottles Performed at [facility] [address], [place], [place] [address]    Culture   Final    NO GROWTH 5 DAYS Performed at [facility], [place] [address], [place], [place] [address]    Report Status 03/18/2018 FINAL  Final  Culture, blood (Routine x 2)     Status: None   Collection Time: 03/13/18 10:13 AM  Result Value Ref Range Status   Specimen Description   Final    BLOOD RIGHT HAND Performed at [facility] [address] [place], [place] [address]    Special Requests   Final    BOTTLES DRAWN AEROBIC AND ANAEROBIC Blood Culture results may not be optimal due to an excessive volume of blood received in culture bottles Performed at [facility] [address] [place], [place] [address]    Culture   Final    NO GROWTH 5 DAYS Performed at [facility], [place] [address] [place],  [address]    Report Status 03/18/2018 FINAL  Final  Urine culture     Status: None   Collection Time: 03/13/18  1:10 PM  Result Value Ref Range Status   Specimen Description   Final    URINE, RANDOM Performed at [facility] [address] [place], [place]  [address]    Special Requests   Final    NONE Performed at [facility], [place] [address] [place], [place] [address]    Culture   Final    NO GROWTH Performed at [facility], [place] [address], [place], [place] [address]     Report Status 03/15/2018 FINAL  Final  Blood culture (routine x 2)     Status: None   Collection Time: 03/15/18  3:15 PM  Result Value Ref Range Status   Specimen Description   Final    BLOOD FATTY CASTS Performed at [facility] [address] [place], [place] [address]    Special Requests   Final    BOTTLES DRAWN AEROBIC AND ANAEROBIC Blood Culture adequate volume Performed at [facility] [address], [place], [place] [address]    Culture   Final    NO GROWTH 5 DAYS Performed at [facility], [place] [address] [place], [place] [address]    Report Status 03/20/2018 FINAL  Final  Blood culture (routine x 2)     Status: None   Collection Time: 03/15/18  3:20 PM  Result Value Ref Range Status   Specimen Description   Final    BLOOD FATTY CASTS Performed at [facility] [address] [place], [place] [address]    Special Requests   Final    BOTTLES DRAWN AEROBIC ONLY Blood Culture adequate volume Performed at [facility] [address], [place], [place] [address]    Culture   Final    NO GROWTH 5 DAYS Performed at [facility], [place] [address] [place], [place] [address]    Report Status 03/20/2018 FINAL  Final    Radiology Reports Dg Chest 2 View  Result Date: 03/13/2018 CLINICAL DATA:  Sepsis. EXAM: CHEST - 2 VIEW COMPARISON:  Radiograph Jul 15, 2017. FINDINGS: Stable cardiomediastinal silhouette. Stable elevated left hemidiaphragm is noted with minimal left basilar subsegmental atelectasis. No pneumothorax or significant pleural effusion is noted. Right lung is clear. Bony thorax is unremarkable. IMPRESSION: Stable elevated left hemidiaphragm with minimal left basilar subsegmental atelectasis. Electronically Signed   By: Marijo Conception, M.D.   On: 03/13/2018 11:29   Dg Ankle Complete Right  Result Date: 03/13/2018 CLINICAL DATA:  Right ankle wound. EXAM: RIGHT ANKLE - COMPLETE 3+  VIEW COMPARISON:  Radiographs of May 23, 2017. FINDINGS: There is no evidence of fracture, dislocation, or joint effusion. There is no evidence of arthropathy or other focal bone abnormality. Vascular calcifications are noted. Probable soft tissue ulceration is seen overlying distal right fibula. No lytic destruction is seen to suggest osteomyelitis. IMPRESSION: Probable soft tissue ulceration seen overlying distal right fibula. No significant bony abnormality is noted. Electronically Signed   By: Marijo Conception, M.D.   On: 03/13/2018 11:31     CBC Recent Labs  Lab 03/15/18 1437 03/16/18 0554 03/17/18 0523 03/18/18 0530 03/19/18 0543  WBC 11.2* 8.9 7.7 7.0 7.0  HGB 9.3* 8.9* 8.8* 9.1* 9.0*  HCT 30.5* 28.8* 29.4* 29.6* 29.2*  PLT 130* 133* 139* 152 169  MCV 106.3* 107.1* 106.1* 104.2* 103.5*  MCH 32.4 33.1 31.8 32.0 31.9  MCHC 30.5 30.9 29.9* 30.7 30.8  RDW 14.6 14.7 14.6 14.6 14.5  LYMPHSABS 0.2*  --  0.5* 0.6* 0.7  MONOABS 1.0  --  0.9 1.0 1.2*  EOSABS 0.0  --  0.1 0.1 0.1  BASOSABS 0.0  --  0.0 0.0 0.0    Chemistries  Recent Labs  Lab 03/15/18 1437 03/16/18 0554 03/16/18 1758 03/17/18 0523 03/18/18 0530 03/19/18 0543  NA 146* 142  --  139 138 139  K 4.2 4.0  --  4.1 4.0 4.0  CL 110 110  --  108 106 109  CO2 22 21*  --  20* 23 22  GLUCOSE 103* 106*  --  124* 88 83  BUN 38* 41*  --  45* 44* 39*  CREATININE 1.68* 1.79*  --  1.84* 1.60* 1.24  CALCIUM 7.9* 7.4*  --  7.2* 7.5* 7.8*  MG  --   --  2.1  --   --   --   AST 42*  --   --   --   --   --   ALT 24  --   --   --   --   --   ALKPHOS 67  --   --   --   --   --   BILITOT 1.0  --   --   --   --   --    ------------------------------------------------------------------------------------------------------------------ No results for input(s): CHOL, HDL, LDLCALC, TRIG, CHOLHDL, LDLDIRECT in the last 72 hours.  Lab Results  Component Value Date   HGBA1C 5.0 07/12/2017    ------------------------------------------------------------------------------------------------------------------ No results for input(s): TSH, T4TOTAL, T3FREE, THYROIDAB in the last 72 hours.  Invalid input(s): FREET3 ------------------------------------------------------------------------------------------------------------------ No results for input(s): VITAMINB12, FOLATE, FERRITIN, TIBC, IRON, RETICCTPCT in the last 72 hours.  Coagulation profile No results for input(s): INR, PROTIME in the last 168 hours.  No results for input(s): DDIMER in the last 72 hours.  Cardiac Enzymes No results for input(s): CKMB, TROPONINI, MYOGLOBIN in the last 168 hours.  Invalid input(s): CK ------------------------------------------------------------------------------------------------------------------    Component Value Date/Time   BNP 309.9 (H) 07/11/2017 7989     Roxan Hockey M.D on 03/20/2018 at 3:01 PM  Pager---[phone] Go to [email or web address] - password [email or web address] for contact info  [facility] - Office  [phone]

## 2018-03-20 NOTE — Care Management Note (Signed)
Case Management Note  Patient Details  Name: [name] MRN: [id number] Date of Birth: 06/17/1933  Subjective/Objective:  CM/CSW went into rm together to discuss d/c plan-Patient/spouse has agreed to going home w/HHC-[facility] rep [name] will talk to spouse about [facility] services.[facility] rep [name] will discuss [facility] with patient/spouse per spouse agreement. Patient will d/c home by own transp-CM recc [facility] but spouse states she will manage with EMS asst that is right down the street from where she lives. MD updated on d/c plan home w/HHC. No further CM needs.                  Action/Plan:d/c home w/HHC.   Expected Discharge Date:  03/20/18               Expected Discharge Plan:  [place]  In-House Referral:     Discharge planning Services  CM Consult  Post Acute Care Choice:  Home Health([facility]) Choice offered to:  Spouse  DME Arranged:    DME Agency:     HH Arranged:  RN, PT, OT, Nurse's Aide, Social Work [employer] Agency:  [place]  Status of Service:  Completed, signed off  If discussed at [employer] of Stay Meetings, dates discussed:    Additional Comments:  [name], RN 03/20/2018, 11:49 AM

## 2018-03-20 NOTE — Consult Note (Addendum)
   [name] CM Inpatient Consult   03/20/2018  [name] May 28, 1933 [id number]   Received request from inpatient RNCM for [facility] hospital liaison to follow up with [name] and wife about [facility] services. Mr. Batson as unplanned readmission risk score of 40% (extreme).  Went to bedside to speak with Mr and [name] about [facility] program services.  They are agreeable and [facility] Care Management written consent obtained and [facility] folder provided.  [name] endorses they declined SNF placement and she plans on taking [name] home. Writer explained [facility] will not interfere or replace services provided by home health.  [name] confirms Primary Care Provider is [name] ([facility] is listed as doing transition of care calls).   [name] also states transportation to MD appointments is of concern. Agreeable to [facility] Social Worker referral for transportation needs.  [name] also states [name] is on "a lot of medications and will be on some new medications too. It would be helpful to have someone overlook the medications for a review." Agreeable to [facility] Pharmacist referral.   Confirmed best contact number for [name] as home [phone]. [name] lists his wife [name] as emergency contact at (cell) [phone] and ( home) [phone].  The [facility] expressed appreciation of the bedside visit.  Will make referral to [facility] team, [facility] Social Worker, and [facility].  Made inpatient RNCM aware [facility] to follow post discharge.  Addendum: Spoke with [name] with [facility] who indicates [name] is not eligible for [facility] program since [name] has had [facility] at least twice in the past. [name] will continue with [facility] services. Discussed this with [name] as well.  Robert Rolling, MSN-Ed, RN,BSN [facility] Liaison [phone]

## 2018-03-20 NOTE — Progress Notes (Signed)
Clinical Social Worker following patient for support and discharge needs. CSW and RNCM met spouse and patient at bedside to discuss discharge plans. Spouse went through a list of facilities that made a bed offer. After looking through the list spouse stated she would rather take patient home with home health. CSW signing of as social work intervention is no longer needed.   Rhea Pink, MSW,  [place]

## 2018-03-21 ENCOUNTER — Other Ambulatory Visit: Payer: Self-pay

## 2018-03-21 ENCOUNTER — Telehealth: Payer: Self-pay | Admitting: Cardiovascular Disease

## 2018-03-21 NOTE — Telephone Encounter (Signed)
New message:    Patient wife calling concerning the medication list. Patient wife has some questions.

## 2018-03-21 NOTE — Patient Outreach (Signed)
[facility]) Care Management  03/21/2018  [name] November 08, 1933 [id number]   Initial outreach regarding high priority social work referral received on 03/20/18 for transportation resources.   BSW spoke with patient's spouse, Bertrum Helmstetter.  [name] reported that she drives but, due to [name]'s current weakness, she is unable to transfer him by herself.  She acknowledged that SNF placement was most appropriate after discharge but Mr. Sailors was "adamant" about not going.  [name] also said that she was not willing to send him to any of the facilities that had an available bed at time of hospital discharge.  She does not have family in the area that can assist with transfer when he needs to attend appointments. .  Per [name], she spoke with Carney Corners RN, about this during home visit.  She was informed that transportation agencies are not able to come into the home to assist with transfer.  BSW confirmed this.  BSW inquired about whether or not they have a church community and if members may be willing to assist her when he has MD appointments.  [name] said that she had not thought of this as an option and was appreciative of the idea.  She feels that members of her church community would be willing to help.  BSW inquired about the need for DME.  She reported that they have a shower with sliding glass doors, grab bars, and handheld shower head.  He also has a wheelchair and walker.  She denied the need for additional DME.   BSW encouraged [name] to contact [facility] to inquire about an aide while [name] is receiving skilled services.  [name] acknowledged that it would be helpful to have these services.  She agreed to contact [facility] [place].  BSW provided [name] with contact information and encouraged her to pass it along to [place] in order to collaborate.   BSW will follow up with [name] next week.   Ronn Melena, BSW Social  Worker [phone]

## 2018-03-21 NOTE — Telephone Encounter (Signed)
Returned call to patient's wife about med list @ discharge. She reports lasix & potassium were not listed in the "schedule" of when to take medications which is provided in discharge summary so she was unsure if she was to give them. Advised that neither of these medications changed in dose/frequency so he should continue to take. She also asked about prednisone dose, which was deferred to PCP. She asked if doxazosin can be taken in AM - advised he should take as he was previously, prior to hospital admit. He has PA office visit on 03/26/18 - she states she plans to bring him in unless he has a set back - she has difficulties transporting him. Advised to call office if appointment needs to be moved/cancelled.

## 2018-03-24 ENCOUNTER — Emergency Department (HOSPITAL_COMMUNITY): Payer: Medicare Other

## 2018-03-24 ENCOUNTER — Other Ambulatory Visit: Payer: Self-pay

## 2018-03-24 ENCOUNTER — Inpatient Hospital Stay (HOSPITAL_COMMUNITY)
Admission: EM | Admit: 2018-03-24 | Discharge: 2018-04-02 | DRG: 040 | Disposition: A | Discharge status: Skilled Nursing Facility | Payer: Medicare Other | Attending: Internal Medicine | Admitting: Internal Medicine

## 2018-03-24 ENCOUNTER — Ambulatory Visit: Payer: Self-pay | Admitting: Pharmacist

## 2018-03-24 ENCOUNTER — Encounter (HOSPITAL_COMMUNITY): Payer: Self-pay | Admitting: *Deleted

## 2018-03-24 DIAGNOSIS — Z955 Presence of coronary angioplasty implant and graft: Secondary | ICD-10-CM

## 2018-03-24 DIAGNOSIS — L0390 Cellulitis, unspecified: Secondary | ICD-10-CM | POA: Diagnosis present

## 2018-03-24 DIAGNOSIS — I130 Hypertensive heart and chronic kidney disease with heart failure and stage 1 through stage 4 chronic kidney disease, or unspecified chronic kidney disease: Secondary | ICD-10-CM | POA: Diagnosis present

## 2018-03-24 DIAGNOSIS — Z981 Arthrodesis status: Secondary | ICD-10-CM

## 2018-03-24 DIAGNOSIS — I2510 Atherosclerotic heart disease of native coronary artery without angina pectoris: Secondary | ICD-10-CM | POA: Diagnosis present

## 2018-03-24 DIAGNOSIS — E440 Moderate protein-calorie malnutrition: Secondary | ICD-10-CM | POA: Diagnosis present

## 2018-03-24 DIAGNOSIS — L89510 Pressure ulcer of right ankle, unstageable: Secondary | ICD-10-CM | POA: Diagnosis present

## 2018-03-24 DIAGNOSIS — N179 Acute kidney failure, unspecified: Secondary | ICD-10-CM

## 2018-03-24 DIAGNOSIS — N390 Urinary tract infection, site not specified: Secondary | ICD-10-CM | POA: Diagnosis present

## 2018-03-24 DIAGNOSIS — R7303 Prediabetes: Secondary | ICD-10-CM | POA: Diagnosis present

## 2018-03-24 DIAGNOSIS — G4733 Obstructive sleep apnea (adult) (pediatric): Secondary | ICD-10-CM | POA: Diagnosis present

## 2018-03-24 DIAGNOSIS — G40209 Localization-related (focal) (partial) symptomatic epilepsy and epileptic syndromes with complex partial seizures, not intractable, without status epilepticus: Secondary | ICD-10-CM | POA: Diagnosis present

## 2018-03-24 DIAGNOSIS — L03116 Cellulitis of left lower limb: Secondary | ICD-10-CM | POA: Diagnosis present

## 2018-03-24 DIAGNOSIS — F329 Major depressive disorder, single episode, unspecified: Secondary | ICD-10-CM | POA: Diagnosis present

## 2018-03-24 DIAGNOSIS — Z888 Allergy status to other drugs, medicaments and biological substances status: Secondary | ICD-10-CM

## 2018-03-24 DIAGNOSIS — I472 Ventricular tachycardia: Secondary | ICD-10-CM | POA: Diagnosis not present

## 2018-03-24 DIAGNOSIS — M1990 Unspecified osteoarthritis, unspecified site: Secondary | ICD-10-CM | POA: Diagnosis present

## 2018-03-24 DIAGNOSIS — G459 Transient cerebral ischemic attack, unspecified: Secondary | ICD-10-CM | POA: Diagnosis present

## 2018-03-24 DIAGNOSIS — G9340 Encephalopathy, unspecified: Secondary | ICD-10-CM | POA: Diagnosis present

## 2018-03-24 DIAGNOSIS — I63441 Cerebral infarction due to embolism of right cerebellar artery: Principal | ICD-10-CM | POA: Diagnosis present

## 2018-03-24 DIAGNOSIS — I4589 Other specified conduction disorders: Secondary | ICD-10-CM | POA: Diagnosis present

## 2018-03-24 DIAGNOSIS — Z7952 Long term (current) use of systemic steroids: Secondary | ICD-10-CM

## 2018-03-24 DIAGNOSIS — M86271 Subacute osteomyelitis, right ankle and foot: Secondary | ICD-10-CM

## 2018-03-24 DIAGNOSIS — R402250 Coma scale, best verbal response, oriented, unspecified time: Secondary | ICD-10-CM | POA: Diagnosis present

## 2018-03-24 DIAGNOSIS — L03115 Cellulitis of right lower limb: Secondary | ICD-10-CM | POA: Diagnosis present

## 2018-03-24 DIAGNOSIS — G40A09 Absence epileptic syndrome, not intractable, without status epilepticus: Secondary | ICD-10-CM | POA: Diagnosis present

## 2018-03-24 DIAGNOSIS — N1831 Chronic kidney disease, stage 3a: Secondary | ICD-10-CM | POA: Diagnosis present

## 2018-03-24 DIAGNOSIS — E039 Hypothyroidism, unspecified: Secondary | ICD-10-CM | POA: Diagnosis present

## 2018-03-24 DIAGNOSIS — R402360 Coma scale, best motor response, obeys commands, unspecified time: Secondary | ICD-10-CM | POA: Diagnosis present

## 2018-03-24 DIAGNOSIS — L03119 Cellulitis of unspecified part of limb: Secondary | ICD-10-CM

## 2018-03-24 DIAGNOSIS — E785 Hyperlipidemia, unspecified: Secondary | ICD-10-CM | POA: Diagnosis present

## 2018-03-24 DIAGNOSIS — G8311 Monoplegia of lower limb affecting right dominant side: Secondary | ICD-10-CM

## 2018-03-24 DIAGNOSIS — S88119A Complete traumatic amputation at level between knee and ankle, unspecified lower leg, initial encounter: Secondary | ICD-10-CM

## 2018-03-24 DIAGNOSIS — R29704 NIHSS score 4: Secondary | ICD-10-CM | POA: Diagnosis present

## 2018-03-24 DIAGNOSIS — I10 Essential (primary) hypertension: Secondary | ICD-10-CM | POA: Diagnosis present

## 2018-03-24 DIAGNOSIS — Z89511 Acquired absence of right leg below knee: Secondary | ICD-10-CM

## 2018-03-24 DIAGNOSIS — R413 Other amnesia: Secondary | ICD-10-CM | POA: Diagnosis present

## 2018-03-24 DIAGNOSIS — I96 Gangrene, not elsewhere classified: Secondary | ICD-10-CM | POA: Diagnosis present

## 2018-03-24 DIAGNOSIS — N50812 Left testicular pain: Secondary | ICD-10-CM | POA: Diagnosis present

## 2018-03-24 DIAGNOSIS — R0682 Tachypnea, not elsewhere classified: Secondary | ICD-10-CM

## 2018-03-24 DIAGNOSIS — Z7983 Long term (current) use of bisphosphonates: Secondary | ICD-10-CM

## 2018-03-24 DIAGNOSIS — I5032 Chronic diastolic (congestive) heart failure: Secondary | ICD-10-CM | POA: Diagnosis not present

## 2018-03-24 DIAGNOSIS — G92 Toxic encephalopathy: Secondary | ICD-10-CM | POA: Diagnosis present

## 2018-03-24 DIAGNOSIS — K5900 Constipation, unspecified: Secondary | ICD-10-CM | POA: Diagnosis present

## 2018-03-24 DIAGNOSIS — Z9189 Other specified personal risk factors, not elsewhere classified: Secondary | ICD-10-CM

## 2018-03-24 DIAGNOSIS — S88111A Complete traumatic amputation at level between knee and ankle, right lower leg, initial encounter: Secondary | ICD-10-CM

## 2018-03-24 DIAGNOSIS — G40909 Epilepsy, unspecified, not intractable, without status epilepticus: Secondary | ICD-10-CM

## 2018-03-24 DIAGNOSIS — G8918 Other acute postprocedural pain: Secondary | ICD-10-CM

## 2018-03-24 DIAGNOSIS — I4821 Permanent atrial fibrillation: Secondary | ICD-10-CM | POA: Diagnosis present

## 2018-03-24 DIAGNOSIS — I482 Chronic atrial fibrillation, unspecified: Secondary | ICD-10-CM | POA: Diagnosis present

## 2018-03-24 DIAGNOSIS — Z79899 Other long term (current) drug therapy: Secondary | ICD-10-CM

## 2018-03-24 DIAGNOSIS — R7301 Impaired fasting glucose: Secondary | ICD-10-CM | POA: Diagnosis not present

## 2018-03-24 DIAGNOSIS — I69311 Memory deficit following cerebral infarction: Secondary | ICD-10-CM

## 2018-03-24 DIAGNOSIS — Z792 Long term (current) use of antibiotics: Secondary | ICD-10-CM

## 2018-03-24 DIAGNOSIS — N183 Chronic kidney disease, stage 3 (moderate): Secondary | ICD-10-CM | POA: Diagnosis present

## 2018-03-24 DIAGNOSIS — R569 Unspecified convulsions: Secondary | ICD-10-CM

## 2018-03-24 DIAGNOSIS — R402140 Coma scale, eyes open, spontaneous, unspecified time: Secondary | ICD-10-CM | POA: Diagnosis present

## 2018-03-24 DIAGNOSIS — R45851 Suicidal ideations: Secondary | ICD-10-CM | POA: Diagnosis not present

## 2018-03-24 DIAGNOSIS — R269 Unspecified abnormalities of gait and mobility: Secondary | ICD-10-CM | POA: Diagnosis present

## 2018-03-24 DIAGNOSIS — R4182 Altered mental status, unspecified: Secondary | ICD-10-CM | POA: Diagnosis present

## 2018-03-24 DIAGNOSIS — Z9181 History of falling: Secondary | ICD-10-CM

## 2018-03-24 DIAGNOSIS — R410 Disorientation, unspecified: Secondary | ICD-10-CM | POA: Diagnosis not present

## 2018-03-24 DIAGNOSIS — D539 Nutritional anemia, unspecified: Secondary | ICD-10-CM | POA: Diagnosis present

## 2018-03-24 DIAGNOSIS — Z87891 Personal history of nicotine dependence: Secondary | ICD-10-CM

## 2018-03-24 DIAGNOSIS — Z96653 Presence of artificial knee joint, bilateral: Secondary | ICD-10-CM | POA: Diagnosis present

## 2018-03-24 DIAGNOSIS — D631 Anemia in chronic kidney disease: Secondary | ICD-10-CM | POA: Diagnosis present

## 2018-03-24 DIAGNOSIS — I6789 Other cerebrovascular disease: Secondary | ICD-10-CM | POA: Diagnosis present

## 2018-03-24 DIAGNOSIS — Z885 Allergy status to narcotic agent status: Secondary | ICD-10-CM

## 2018-03-24 DIAGNOSIS — G894 Chronic pain syndrome: Secondary | ICD-10-CM | POA: Diagnosis present

## 2018-03-24 DIAGNOSIS — Z7982 Long term (current) use of aspirin: Secondary | ICD-10-CM

## 2018-03-24 DIAGNOSIS — K219 Gastro-esophageal reflux disease without esophagitis: Secondary | ICD-10-CM | POA: Diagnosis present

## 2018-03-24 LAB — COMPREHENSIVE METABOLIC PANEL
ALT: 56 U/L — ABNORMAL HIGH (ref 0–44)
AST: 43 U/L — ABNORMAL HIGH (ref 15–41)
Albumin: 3.2 g/dL — ABNORMAL LOW (ref 3.5–5.0)
Alkaline Phosphatase: 70 U/L (ref 38–126)
Anion gap: 11 (ref 5–15)
BUN: 47 mg/dL — ABNORMAL HIGH (ref 8–23)
CO2: 24 mmol/L (ref 22–32)
Calcium: 8.6 mg/dL — ABNORMAL LOW (ref 8.9–10.3)
Chloride: 106 mmol/L (ref 98–111)
Creatinine, Ser: 1.39 mg/dL — ABNORMAL HIGH (ref 0.61–1.24)
GFR calc Af Amer: 54 mL/min — ABNORMAL LOW (ref >60)
GFR calc non Af Amer: 47 mL/min — ABNORMAL LOW (ref >60)
Glucose, Bld: 163 mg/dL — ABNORMAL HIGH (ref 70–99)
Potassium: 3.7 mmol/L (ref 3.5–5.1)
Sodium: 141 mmol/L (ref 135–145)
Total Bilirubin: 0.6 mg/dL (ref 0.3–1.2)
Total Protein: 5.8 g/dL — ABNORMAL LOW (ref 6.5–8.1)

## 2018-03-24 LAB — CBC WITH DIFFERENTIAL/PLATELET
Abs Immature Granulocytes: 1.11 10*3/uL — ABNORMAL HIGH (ref 0.00–0.07)
Basophils Absolute: 0.1 10*3/uL (ref 0.0–0.1)
Basophils Relative: 1 %
Eosinophils Absolute: 0.1 10*3/uL (ref 0.0–0.5)
Eosinophils Relative: 1 %
HCT: 31.7 % — ABNORMAL LOW (ref 39.0–52.0)
Hemoglobin: 9.6 g/dL — ABNORMAL LOW (ref 13.0–17.0)
Immature Granulocytes: 13 %
Lymphocytes Relative: 12 %
Lymphs Abs: 1 10*3/uL (ref 0.7–4.0)
MCH: 32.5 pg (ref 26.0–34.0)
MCHC: 30.3 g/dL (ref 30.0–36.0)
MCV: 107.5 fL — ABNORMAL HIGH (ref 80.0–100.0)
Monocytes Absolute: 0.9 10*3/uL (ref 0.1–1.0)
Monocytes Relative: 11 %
Neutro Abs: 5.5 10*3/uL (ref 1.7–7.7)
Neutrophils Relative %: 62 %
Platelets: 238 10*3/uL (ref 150–400)
RBC: 2.95 MIL/uL — ABNORMAL LOW (ref 4.22–5.81)
RDW: 14.6 % (ref 11.5–15.5)
WBC: 8.6 10*3/uL (ref 4.0–10.5)
nRBC: 0 % (ref 0.0–0.2)

## 2018-03-24 LAB — I-STAT CG4 LACTIC ACID, ED: Lactic Acid, Venous: 1 mmol/L (ref 0.5–1.9)

## 2018-03-24 LAB — PROTIME-INR
INR: 1.16
Prothrombin Time: 14.7 seconds (ref 11.4–15.2)

## 2018-03-24 LAB — SEDIMENTATION RATE: Sed Rate: 24 mm/hr — ABNORMAL HIGH (ref 0–16)

## 2018-03-24 LAB — VALPROIC ACID LEVEL: Valproic Acid Lvl: 37 ug/mL — ABNORMAL LOW (ref 50.0–100.0)

## 2018-03-24 LAB — C-REACTIVE PROTEIN: CRP: 1.8 mg/dL — ABNORMAL HIGH (ref <1.0)

## 2018-03-24 MED ORDER — ACETAMINOPHEN 650 MG RE SUPP: 650.0000 mg | RECTAL | Status: DC | PRN

## 2018-03-24 MED ORDER — IOPAMIDOL (ISOVUE-370) INJECTION 76%
100.0000 mL | Freq: Once | INTRAVENOUS | Status: AC | PRN
  Administered 2018-03-24: 100 mL via INTRAVENOUS

## 2018-03-24 MED ORDER — SODIUM CHLORIDE 0.9 % IV BOLUS
500.0000 mL | Freq: Once | INTRAVENOUS | Status: AC
  Administered 2018-03-24: 500 mL via INTRAVENOUS

## 2018-03-24 MED ORDER — IOPAMIDOL (ISOVUE-370) INJECTION 76%
INTRAVENOUS | Status: AC
  Filled 2018-03-24: qty 100

## 2018-03-24 MED ORDER — PANTOPRAZOLE SODIUM 40 MG PO TBEC
40.0000 mg | DELAYED_RELEASE_TABLET | Freq: Every day | ORAL | Status: DC
  Administered 2018-03-25 – 2018-04-02 (×9): 40 mg via ORAL
  Filled 2018-03-24 (×10): qty 1

## 2018-03-24 MED ORDER — DOXAZOSIN MESYLATE 4 MG PO TABS
4.0000 mg | ORAL_TABLET | Freq: Every evening | ORAL | Status: DC
  Administered 2018-03-25 – 2018-04-02 (×9): 4 mg via ORAL
  Filled 2018-03-24 (×11): qty 1

## 2018-03-24 MED ORDER — DIVALPROEX SODIUM 250 MG PO DR TAB
500.0000 mg | DELAYED_RELEASE_TABLET | Freq: Two times a day (BID) | ORAL | Status: DC
  Administered 2018-03-25 – 2018-03-26 (×3): 500 mg via ORAL
  Filled 2018-03-24 (×4): qty 2

## 2018-03-24 MED ORDER — PREDNISONE 5 MG PO TABS
10.0000 mg | ORAL_TABLET | Freq: Every day | ORAL | Status: DC
  Administered 2018-03-25 – 2018-04-02 (×9): 10 mg via ORAL
  Filled 2018-03-24 (×10): qty 2

## 2018-03-24 MED ORDER — VALPROATE SODIUM 500 MG/5ML IV SOLN
750.0000 mg | Freq: Once | INTRAVENOUS | Status: AC
  Administered 2018-03-24: 750 mg via INTRAVENOUS
  Filled 2018-03-24: qty 7.5

## 2018-03-24 MED ORDER — LEVOTHYROXINE SODIUM 75 MCG PO TABS
150.0000 ug | ORAL_TABLET | Freq: Every day | ORAL | Status: DC
  Administered 2018-03-25 – 2018-04-02 (×9): 150 ug via ORAL
  Filled 2018-03-24 (×10): qty 2

## 2018-03-24 MED ORDER — SODIUM CHLORIDE 0.9 % IV SOLN
1.0000 g | Freq: Two times a day (BID) | INTRAVENOUS | Status: DC
  Administered 2018-03-25 – 2018-03-27 (×5): 1 g via INTRAVENOUS
  Filled 2018-03-24 (×7): qty 1

## 2018-03-24 MED ORDER — ACETAMINOPHEN 325 MG PO TABS
650.0000 mg | ORAL_TABLET | ORAL | Status: DC | PRN
  Administered 2018-03-25: 650 mg via ORAL
  Filled 2018-03-24: qty 2

## 2018-03-24 MED ORDER — VANCOMYCIN HCL IN DEXTROSE 1-5 GM/200ML-% IV SOLN
1000.0000 mg | INTRAVENOUS | Status: DC
  Administered 2018-03-25 – 2018-03-26 (×2): 1000 mg via INTRAVENOUS
  Filled 2018-03-24 (×4): qty 200

## 2018-03-24 MED ORDER — METRONIDAZOLE IN NACL 5-0.79 MG/ML-% IV SOLN
500.0000 mg | Freq: Three times a day (TID) | INTRAVENOUS | Status: DC
  Administered 2018-03-24 (×2): 500 mg via INTRAVENOUS
  Filled 2018-03-24 (×2): qty 100

## 2018-03-24 MED ORDER — SENNOSIDES-DOCUSATE SODIUM 8.6-50 MG PO TABS: 1.0000 | ORAL_TABLET | Freq: Every evening | ORAL | Status: DC | PRN

## 2018-03-24 MED ORDER — VANCOMYCIN HCL IN DEXTROSE 1-5 GM/200ML-% IV SOLN: 1000.0000 mg | Freq: Two times a day (BID) | INTRAVENOUS | Status: DC

## 2018-03-24 MED ORDER — SODIUM CHLORIDE (PF) 0.9 % IJ SOLN
INTRAMUSCULAR | Status: AC
  Filled 2018-03-24: qty 50

## 2018-03-24 MED ORDER — ASPIRIN EC 81 MG PO TBEC
81.0000 mg | DELAYED_RELEASE_TABLET | Freq: Every day | ORAL | Status: DC
  Administered 2018-03-25: 81 mg via ORAL
  Filled 2018-03-24 (×2): qty 1

## 2018-03-24 MED ORDER — METOPROLOL TARTRATE 25 MG PO TABS
25.0000 mg | ORAL_TABLET | Freq: Two times a day (BID) | ORAL | Status: DC
  Administered 2018-03-25 – 2018-04-02 (×16): 25 mg via ORAL
  Filled 2018-03-24 (×18): qty 1

## 2018-03-24 MED ORDER — SODIUM CHLORIDE 0.9 % IV SOLN
Freq: Once | INTRAVENOUS | Status: AC
  Administered 2018-03-24: 13:00:00 via INTRAVENOUS

## 2018-03-24 MED ORDER — VANCOMYCIN HCL IN DEXTROSE 1-5 GM/200ML-% IV SOLN
1000.0000 mg | Freq: Once | INTRAVENOUS | Status: AC
  Administered 2018-03-24: 1000 mg via INTRAVENOUS
  Filled 2018-03-24: qty 200

## 2018-03-24 MED ORDER — SODIUM CHLORIDE 0.9 % IV SOLN
2.0000 g | Freq: Once | INTRAVENOUS | Status: AC
  Administered 2018-03-24: 2 g via INTRAVENOUS
  Filled 2018-03-24: qty 2

## 2018-03-24 MED ORDER — EZETIMIBE 10 MG PO TABS
5.0000 mg | ORAL_TABLET | Freq: Every day | ORAL | Status: DC
  Administered 2018-03-25 – 2018-04-02 (×10): 5 mg via ORAL
  Filled 2018-03-24 (×10): qty 1

## 2018-03-24 MED ORDER — PRAVASTATIN SODIUM 40 MG PO TABS
40.0000 mg | ORAL_TABLET | Freq: Every evening | ORAL | Status: DC
  Administered 2018-03-25 – 2018-04-02 (×9): 40 mg via ORAL
  Filled 2018-03-24 (×9): qty 1

## 2018-03-24 MED ORDER — DILTIAZEM HCL ER COATED BEADS 180 MG PO CP24
300.0000 mg | ORAL_CAPSULE | Freq: Every day | ORAL | Status: DC
  Administered 2018-03-25 – 2018-04-02 (×9): 300 mg via ORAL
  Filled 2018-03-24 (×10): qty 1

## 2018-03-24 MED ORDER — ACETAMINOPHEN 160 MG/5ML PO SOLN: 650.0000 mg | ORAL | Status: DC | PRN

## 2018-03-24 MED ORDER — FUROSEMIDE 40 MG PO TABS
40.0000 mg | ORAL_TABLET | Freq: Every day | ORAL | Status: DC
  Administered 2018-03-25 – 2018-03-27 (×3): 40 mg via ORAL
  Filled 2018-03-24 (×3): qty 1

## 2018-03-24 MED ORDER — SODIUM CHLORIDE 0.9 % IV SOLN
INTRAVENOUS | Status: DC
  Administered 2018-03-25 (×2): via INTRAVENOUS

## 2018-03-24 MED ORDER — STROKE: EARLY STAGES OF RECOVERY BOOK
Freq: Once | Status: DC
  Filled 2018-03-24: qty 1

## 2018-03-24 NOTE — ED Notes (Signed)
Pt in X ray

## 2018-03-24 NOTE — Progress Notes (Signed)
Pharmacy Antibiotic Note  [name] is a 85 y.o. male admitted on 03/24/2018 with cellulitis.  Pharmacy has been consulted for Vancomycin dosing. 03/24/2018:  Afebrile  No leukocytosis  Renal function at patient's baseline  Plan: Cefepime 1gm IV q12h Vancomycin 1gm IV q24h Flagyl per MD Monitor renal function and cx data   Height: 5\' 9"  (175.3 cm) Weight: 165 lb (74.8 kg) IBW/kg (Calculated) : 70.7  Temp (24hrs), Avg:96.8 F (36 C), Min:96.8 F (36 C), Max:96.8 F (36 C)  Recent Labs  Lab 03/18/18 0530 03/19/18 0543 03/24/18 1026 03/24/18 1027  WBC 7.0 7.0 8.6  --   CREATININE 1.60* 1.24 1.39*  --   LATICACIDVEN  --   --   --  1.00    Estimated Creatinine Clearance: 41 mL/min (A) (by C-G formula based on SCr of 1.39 mg/dL (H)).    Allergies  Allergen Reactions  . Eliquis [Apixaban] Other (See Comments)    bleeding  . Demerol [Meperidine] Nausea And Vomiting  . Keppra [Levetiracetam] Other (See Comments)    Causes sleepiness, mental status changes    Antimicrobials this admission: 1/13 Vanc >>  1/13 Flagyl >>  1/13 Cefepime>>  Dose adjustments this admission:  Microbiology results: 1/13 BCx:   Thank you for allowing pharmacy to be a part of this patient's care.  Biagio Borg 03/24/2018 7:10 PM

## 2018-03-24 NOTE — Patient Outreach (Signed)
[facility]) Care Management  03/24/2018  SABAN HEINLEN May 22, 1933 [id number]  Received Referral from [place] on 03-20-2018 for [place]. Member's PCP will complete TOC assessment.  Member with recent hospitalization from 03-15-2018 to 1-9-202 at [facility] for CHF and AKI. PMH: HTN/; CHF; AFIB with RVR; CAD s/p angioplasty; CAP; Acute Hypoxemic Respiratory failure; GERD; Seizure d/o; R foot halllux rigidity; Hypothyroidism.  Called member at [employer] preferred number and member's spouse [name] answered phone and HIPPA identifiers confirmed. Introduced self and stated reason for call. [place] services briefly explained.   [name] stated that member's blood pressure reading is 116/60 and stated that member was able to verbally communicate this morning while he was getting bathed and dressed; however, member is currently at breakfast table alert and unable to respond appropriately to questions asked. [name] stated that in the past, when member would have a seizure, member would have a blank stare for a few seconds and would then return to baseline cognitive status and stated that member takes Depakote for seizures.   At this time, [name] stated that member is still unable to answer appropriately. Denies facial drooping, denies weakness on either side of body. [name] stated that member has RLE cellulitis and member's leg is more swollen and red and member has wound appointment today at 1300.  [name] encouraged to call 911 for assistance. [name] with little hesitancy; therefore, [name] also made aware that if she prefers, she may call nurse call line and [name] confirmed phone number as [phone].   Will follow up with member within 1-2 business days.   [name], RN-BSN  [facility] Care Management  Community Care Management Coordinator  [phone] [email or web address]

## 2018-03-24 NOTE — Progress Notes (Signed)
A consult was received from an ED physician for Vancomycin and Cefepime per pharmacy dosing (for an indication other than meningitis). The patient's profile has been reviewed for ht/wt/allergies/indication/available labs. A one time order has been placed for the above antibiotics.  Further antibiotics/pharmacy consults should be ordered by admitting physician if indicated.                       Reuel Boom, PharmD, BCPS [phone] 03/24/2018, 10:59 AM

## 2018-03-24 NOTE — ED Notes (Signed)
Blood cultures collected prior to start of antibiotics.

## 2018-03-24 NOTE — ED Notes (Signed)
Condom cath. Has been placed.

## 2018-03-24 NOTE — Consult Note (Signed)
   [facility] TeleNeurology Consult Services  Date of Service: 03/24/2018  Impression:  Acute onset unresponsivenes/paucity of speech - DDx complex/partial status vs L frontotemporal stroke vs encephalopathy. Recommend CTA head/neck to eval for LVO. If neg, he will likely need a 24 hr EEG.   Recommendations:  CTA head/neck 24hr EEG if the above is negative. Add 250 mg of depakote right now and increase to 500 mg bid. Obtain depakote level in am. Inpatient neuro consult   ---------------------------------------------------------------------  CC: AMS  History of Present Illness:  85 yo hx abscence seizures, hx stroke, presents with altered mental status. Cellulitis on right leg, which has been worse over the past few weeks.  Wife says he is oriented and alert, knows what is going on.  He has been sluggish for a few days but today developed a staring spell at approx 0830, where he became unresponsive with blank stares, but no LOC.  This has happened in the past, and he was diagnosed with absence seizures.   His depakote was reduced a few months ago to 250 mg PO AM and 500 mg PM.  4-5 hours since episode but not back in baseline.  Diagnostic Testing:  CT head unremarkable.  Vital Signs:   Vitals:   03/24/18 1500 03/24/18 1534  BP: 127/65 130/67  Pulse: 86 85  Resp: 11 10  Temp:    SpO2: 98% 97%     Exam:  Mental Status:  Awake, alert, nonverbal; does not follow commands.  Cranial Nerves:  Pupils: Equal round and reactive to light Extraocular movements: Intact in all cardinal gaze Ptosis: Absent Visual fields: Intact to finger counting Facial sensation: Intact to pin and light touch Facial movements: Intact and symmetric    Motor Exam:  No movement to command or pain in B UE; moved L LE to pain.   Sensory Exam:   Light touch: Intact    Coordination:   Unable to test.  Medical Decision Making:  - Extensive number of diagnosis or management options are  considered above.   - Extensive amount of complex data reviewed.   - High risk of complication and/or morbidity or mortality are associated with differential diagnostic considerations above.  - There may be uncertain outcome and increased probability of prolonged functional impairment or high probability of severe prolonged functional impairment associated with some of these differential diagnosis.   Medical Data Reviewed:  1.Data reviewed include clinical labs, radiology,  Medical Tests;   2.Tests results discussed w/performing or interpreting physician;   3.Obtaining/reviewing old medical records;  4.Obtaining case history from another source;  5.Independent review of image, tracing or specimen.    Patient was informed the Neurology Consult would happen via TeleHealth consult by way of interactive audio and video telecommunications and consented to receiving care in this manner.

## 2018-03-24 NOTE — ED Provider Notes (Signed)
[facility] DEPT Provider Note   CSN: [id number] Arrival date & time: 03/24/18  2993     History   Chief Complaint Chief Complaint  Patient presents with  . Wound Check  . Altered Mental Status    HPI [name] is a 85 y.o. male.  HPI  Level 5 caveat for altered mental status.  85 year old male with history of A. fib, CAD, seizures and chronic right lower extremity wound with an admission for cellulitis comes into the ER with chief complaint of altered mental status.  Patient is unable to provide any meaningful history.  He is sleepy but easily arousable and follows simple commands.  Patient is oriented to self and location only.  I spoke with patient's wife, who reports that earlier this morning patient had an episode of blank stare that lasted for about 5 minutes.  He has history of absence seizures, but he has not had an episode in several weeks.  Patient's Depakote was reduced a few months ago because of well-controlled seizures.  Past Medical History:  Diagnosis Date  . Abnormality of gait 09/22/2015  . Arthritis   . Atrial fibrillation ([place])   . CAD (coronary artery disease)    Stent to RCA, Penta stent, 99% reduced to 0% 2002.  . Cancer ([place])    skin CA removed from back  . Chronic insomnia 03/30/2015  . Complication of anesthesia    trouble waking up  . Constipation    from medications taken  . GERD (gastroesophageal reflux disease)   . Hypercholesteremia   . Hypertension   . Hypothyroidism   . Memory difficulty 09/22/2015  . Osteoarthritis   . Seizures ([place])   . Sepsis ([place]) 05/2017  . Transient alteration of awareness 03/30/2015  . Vertigo    hx of    Patient Active Problem List   Diagnosis Date Noted  . Cellulitis 03/15/2018  . Hypernatremia 03/15/2018  . Persistent atrial fibrillation 11/10/2017  . Coronary artery disease involving native coronary artery of native heart without angina pectoris 11/10/2017  .  Severe muscle deconditioning 11/10/2017  . Hemorrhage 10/22/2017  . CAD S/P percutaneous coronary angioplasty 10/22/2017  . Acute hypoxemic respiratory failure ([place]) 07/17/2017  . Aspiration syndrome, subsequent encounter 07/11/2017  . Aspiration pneumonia ([place]) 07/01/2017  . Acute encephalopathy 07/01/2017  . Thrombocytopenia ([place]) 07/01/2017  . Chronic diastolic (congestive) heart failure ([place]) 07/01/2017  . Anemia of chronic disease 07/01/2017  . Troponin level elevated 07/01/2017  . Chronic atrial fibrillation 07/01/2017  . Goals of care, counseling/discussion   . Palliative care by specialist   . Acute metabolic encephalopathy 71/69/6789  . CKD (chronic kidney disease) stage 3, GFR 30-59 ml/min (HCC)   . CAP (community acquired pneumonia) 04/17/2017  . Atrial fibrillation with RVR ([place]) 04/17/2017  . Hallux rigidus, right foot 03/10/2016  . Lumbosacral spondylosis without myelopathy 10/29/2015  . Memory difficulty 09/22/2015  . Abnormality of gait 09/22/2015  . Hyperglycemia 07/06/2015  . Chronic pain 07/06/2015  . Chronic insomnia 03/30/2015  . Transient alteration of awareness 03/30/2015  . Abnormal liver function   . Altered mental status 01/31/2015  . Essential hypertension 01/31/2015  . Constipation 01/31/2015  . Hypothyroidism 01/31/2015  . Seizure disorder ([place]) 01/31/2015  . Bladder outlet obstruction 01/31/2015  . GERD (gastroesophageal reflux disease) 01/31/2015  . Chronic back pain 01/31/2015  . Acute kidney injury ([place]) 01/31/2015    Past Surgical History:  Procedure Laterality Date  . BACK SURGERY    .  EYE SURGERY     Bilateral Cataract surgery   . HERNIA REPAIR    . I&D EXTREMITY Right 05/10/2015   Procedure: IRRIGATION AND DEBRIDEMENT EXTREMITY;  Surgeon: Leanora Cover, MD;  Location: [facility];  Service: Orthopedics;  Laterality: Right;  . KNEE ARTHROPLASTY     right knee X 2; left knee once  . LAMINECTOMY     X 6  . POSTERIOR  CERVICAL FUSION/FORAMINOTOMY  01/28/2012   Procedure: POSTERIOR CERVICAL FUSION/FORAMINOTOMY LEVEL 3;  Surgeon: [name], MD;  Location: [facility];  Service: Neurosurgery;  Laterality: Left;  Posterior Cervical Five-Thoracic One Fusion, Arthrodesis with LEFT Cervical Seven-thoracic One Laminectomy, Foraminotomy and Resection of Synovial Cyst  . POSTERIOR CERVICAL FUSION/FORAMINOTOMY N/A 01/29/2013   Procedure: POSTERIOR CERVICAL FUSION/FORAMINOTOMY LEVEL 1 and C2-5 Posteriolateral Arthrodesis;  Surgeon: [name], MD;  Location: [facility];  Service: Neurosurgery;  Laterality: N/A;  C2-C3 Laminectomy C2-C3 posterior cervical arthrodesis  . TONSILLECTOMY          Home Medications    Prior to Admission medications   Medication Sig Start Date End Date Taking? Authorizing Provider  acetaminophen (TYLENOL) 325 MG tablet Take 650 mg by mouth daily as needed for moderate pain.    Yes [provider]  acetaminophen (TYLENOL) 500 MG tablet Take 500 mg by mouth daily as needed for moderate pain.   Yes [provider]  alendronate (FOSAMAX) 70 MG tablet Take 70 mg by mouth once a week. On Wednesday. Remain upright for 30-60 minutes. 01/06/12  Yes [provider]  Amino Acids-Protein Hydrolys (FEEDING SUPPLEMENT, PRO-STAT SUGAR FREE 64,) LIQD Take 30 mLs by mouth 2 (two) times daily. 07/05/17  Yes Lavina Hamman, MD  aspirin EC 81 MG tablet Take 1 tablet (81 mg total) by mouth daily. 10/15/17  Yes Barrett, Evelene Croon, PA-C  calcium carbonate (OS-CAL) 600 MG tablet Take 600 mg by mouth daily after breakfast.    Yes [provider]  cephALEXin (KEFLEX) 500 MG capsule Take 1 capsule (500 mg total) by mouth 3 (three) times daily for 7 days. 03/19/18 03/26/18 Yes Emokpae, Courage, MD  cyanocobalamin 500 MCG tablet Take 500 mcg by mouth at bedtime.   Yes [provider]  diclofenac sodium (VOLTAREN) 1 % GEL Apply 2 g topically daily as needed (pain).    Yes [provider]  diltiazem (CARDIZEM CD) 300 MG 24 hr capsule TAKE 1 CAPSULE(300 MG) BY MOUTH EVERY MORNING. DO NOT CRUSH Patient taking differently: Take 300 mg by mouth daily.  12/03/17  Yes Croitoru, Mihai, MD  divalproex (DEPAKOTE) 250 MG DR tablet Take 250 mg by mouth every morning.   Yes [provider]  divalproex (DEPAKOTE) 250 MG DR tablet Take 500 mg by mouth every evening.    Yes [provider]  doxazosin (CARDURA) 4 MG tablet Take 4 mg by mouth every evening.    Yes [provider]  doxycycline (VIBRA-TABS) 100 MG tablet Take 1 tablet (100 mg total) by mouth 2 (two) times daily for 7 days. 03/19/18 03/26/18 Yes Emokpae, Courage, MD  ferrous sulfate 325 (65 FE) MG tablet Take 1 tablet (325 mg total) by mouth 2 (two) times daily with a meal. 06/28/17  Yes Skeet Latch, MD  fluticasone [facility]) 50 MCG/ACT nasal spray Place 2 sprays into both nostrils daily as needed for allergies or rhinitis.   Yes [provider]  furosemide (LASIX) 40 MG tablet Take 1 tablet (40 mg total)  by mouth every morning. Or per cardiologist instructions since patient cannot be weighed 11/28/17  Yes Croitoru, Mihai, MD  HYDROcodone-acetaminophen (NORCO) 10-325 MG tablet Take 1 tablet by mouth every 4 (four) hours as needed for moderate pain. 03/19/18  Yes Roxan Hockey, MD  lactobacillus acidophilus & bulgar (LACTINEX) chewable tablet Chew 1 tablet by mouth 3 (three) times daily with meals. 03/19/18  Yes Roxan Hockey, MD  levothyroxine (SYNTHROID, LEVOTHROID) 150 MCG tablet Take 150 mcg by mouth daily before breakfast. 12/27/11  Yes [provider]  lidocaine (LIDODERM) 5 % Place 1 patch onto the skin daily as needed. Remove & Discard patch within 12 hours or as directed by MD Patient taking differently: Place 1 patch onto the skin daily as needed (pain). Remove & Discard patch within 12 hours or as directed by MD  06/01/17  Yes Mikhail, Velta Addison, DO    Menthol-Methyl Salicylate (MUSCLE RUB EX) Apply 1 application topically daily as needed (shoulder pain).   Yes [provider]  metoprolol tartrate (LOPRESSOR) 25 MG tablet Take 1 tablet (25 mg total) by mouth 2 (two) times daily. Take with or immediately following a meal. 11/06/17  Yes Croitoru, Mihai, MD  Multiple Vitamins-Minerals (CERTAVITE/ANTIOXIDANTS) TABS Take 1 tablet by mouth every evening.    Yes [provider]  omeprazole (PRILOSEC) 20 MG capsule Take 20 mg by mouth daily before breakfast.  12/23/11  Yes [provider]  potassium chloride SA (K-DUR,KLOR-CON) 20 MEQ tablet TAKE 1 TABLET BY MOUTH DAILY ALONG WITH LASIX 40MG  OR AS DIRECTED BY CARDIOLOGIST 03/11/18  Yes Croitoru, Mihai, MD  pravastatin (PRAVACHOL) 40 MG tablet Take 40 mg by mouth every evening.  10/29/14  Yes [provider]  predniSONE (DELTASONE) 5 MG tablet Take 1 tablet (5 mg total) by mouth daily with breakfast. Patient taking differently: Take 10 mg by mouth daily with breakfast.  03/19/18  Yes Emokpae, Courage, MD  sodium chloride (OCEAN) 0.65 % SOLN nasal spray Place 1 spray into both nostrils as needed for congestion.   Yes [provider]  VITAMIN D, ERGOCALCIFEROL, PO Take 1 capsule by mouth daily.    Yes [provider]  ZETIA 10 MG tablet Take 5 mg by mouth at bedtime. (0.5 tablet) 10/15/11  Yes [provider]  nitroGLYCERIN (NITROSTAT) 0.4 MG SL tablet Place 0.4 mg under the tongue every 5 (five) minutes as needed for chest pain. Max of 3 tablets    [provider]    Family History Family History  Problem Relation Age of Onset  . Hypertension Mother   . Cancer Mother   . Kidney failure Father   . Heart disease Father     Social History Social History   Tobacco Use  . Smoking status: Former Research scientist (life sciences)  . Smokeless tobacco: Never Used  Substance Use Topics  . Alcohol use: Yes    Comment: rare  . Drug use: No     Allergies    Eliquis [apixaban]; Demerol [meperidine]; and Keppra [levetiracetam]   Review of Systems Review of Systems  Unable to perform ROS: Dementia     Physical Exam Updated Vital Signs BP 130/67   Pulse 85   Temp (!) 96.8 F (36 C) (Rectal)   Resp 10   Ht 5\' 9"  (1.753 m)   Wt 74.8 kg   SpO2 97%   BMI 24.37 kg/m   Physical Exam Vitals signs and nursing note reviewed.  Constitutional:      Appearance: He is well-developed. He  is not toxic-appearing.     Comments: Somnolent  HENT:     Head: Atraumatic.  Neck:     Musculoskeletal: Neck supple. No neck rigidity.  Cardiovascular:     Rate and Rhythm: Normal rate.  Pulmonary:     Effort: Pulmonary effort is normal.  Skin:    General: Skin is warm.  Neurological:     Comments: Oriented to self and place. Unable to tell me who the president is, which apparently is unusual. Wing all 4 extremities, following simple commands, gross sensory exam is normal          ED Treatments / Results  Labs (all labs ordered are listed, but only abnormal results are displayed) Labs Reviewed  COMPREHENSIVE METABOLIC PANEL - Abnormal; Notable for the following components:      Result Value   Glucose, Bld 163 (*)    BUN 47 (*)    Creatinine, Ser 1.39 (*)    Calcium 8.6 (*)    Total Protein 5.8 (*)    Albumin 3.2 (*)    AST 43 (*)    ALT 56 (*)    GFR calc non Af Amer 47 (*)    GFR calc Af Amer 54 (*)    All other components within normal limits  CBC WITH DIFFERENTIAL/PLATELET - Abnormal; Notable for the following components:   RBC 2.95 (*)    Hemoglobin 9.6 (*)    HCT 31.7 (*)    MCV 107.5 (*)    Abs Immature Granulocytes 1.11 (*)    All other components within normal limits  SEDIMENTATION RATE - Abnormal; Notable for the following components:   Sed Rate 24 (*)    All other components within normal limits  C-REACTIVE PROTEIN - Abnormal; Notable for the following components:   CRP 1.8 (*)    All other components within  normal limits  VALPROIC ACID LEVEL - Abnormal; Notable for the following components:   Valproic Acid Lvl 37 (*)    All other components within normal limits  CULTURE, BLOOD (ROUTINE X 2)  CULTURE, BLOOD (ROUTINE X 2)  URINE CULTURE  PROTIME-INR  URINALYSIS, ROUTINE W REFLEX MICROSCOPIC  I-STAT CG4 LACTIC ACID, ED    EKG EKG Interpretation  Date/Time:  Monday March 24 2018 10:12:54 EST Ventricular Rate:  90 PR Interval:    QRS Duration: 117 QT Interval:  406 QTC Calculation: 497 R Axis:   17 Text Interpretation:  Atrial fibrillation Nonspecific intraventricular conduction delay Borderline repolarization abnormality No acute changes Confirmed by Varney Biles ([id number]) on 03/24/2018 1:24:06 PM   Radiology Dg Chest 2 View  Result Date: 03/24/2018 CLINICAL DATA:  Sepsis, weakness. EXAM: CHEST - 2 VIEW COMPARISON:  Radiograph March 13, 2018. FINDINGS: Stable cardiomegaly. No pneumothorax is noted. Right lung is clear. Stable elevated left hemidiaphragm is noted with minimal left basilar subsegmental atelectasis. Small left pleural effusion is noted. Bony thorax is unremarkable. IMPRESSION: Stable elevated left hemidiaphragm with minimal left basilar atelectasis. Small left pleural effusion is noted. Electronically Signed   By: Marijo Conception, M.D.   On: 03/24/2018 10:44   Ct Head Wo Contrast  Result Date: 03/24/2018 CLINICAL DATA:  Altered level of consciousness EXAM: CT HEAD WITHOUT CONTRAST TECHNIQUE: Contiguous axial images were obtained from the base of the skull through the vertex without intravenous contrast. COMPARISON:  04/17/2017 FINDINGS: Brain: No evidence of acute infarction, hemorrhage, extra-axial collection, ventriculomegaly, or mass effect. Generalized cerebral atrophy. Periventricular white matter low attenuation likely secondary to microangiopathy.  Vascular: Cerebrovascular atherosclerotic calcifications are noted. Skull: Negative for fracture or focal lesion.  Retro-dental pannus formation. Sinuses/Orbits: Visualized portions of the orbits are unremarkable. Visualized portions of the paranasal sinuses and mastoid air cells are unremarkable. Other: None. IMPRESSION: 1. No acute intracranial pathology. 2. Chronic microvascular disease and cerebral atrophy. Electronically Signed   By: Kathreen Devoid   On: 03/24/2018 13:19    Procedures .Critical Care Performed by: Varney Biles, MD Authorized by: Varney Biles, MD   Critical care provider statement:    Critical care time (minutes):  45   Critical care start time:  03/24/2018 11:00 AM   Critical care end time:  03/24/2018 4:10 PM   Critical care was necessary to treat or prevent imminent or life-threatening deterioration of the following conditions:  CNS failure or compromise   Critical care was time spent personally by me on the following activities:  Discussions with consultants, evaluation of patient's response to treatment, examination of patient, ordering and performing treatments and interventions, ordering and review of laboratory studies, ordering and review of radiographic studies, pulse oximetry, re-evaluation of patient's condition, obtaining history from patient or surrogate and review of old charts   (including critical care time)  Medications Ordered in ED Medications  metroNIDAZOLE (FLAGYL) IVPB 500 mg (0 mg Intravenous Stopped 03/24/18 1256)  sodium chloride 0.9 % bolus 500 mL (0 mLs Intravenous Stopped 03/24/18 1256)  0.9 %  sodium chloride infusion ( Intravenous New Bag/Given 03/24/18 1311)  ceFEPIme (MAXIPIME) 2 g in sodium chloride 0.9 % 100 mL IVPB (0 g Intravenous Stopped 03/24/18 1129)  vancomycin (VANCOCIN) IVPB 1000 mg/200 mL premix (1,000 mg Intravenous New Bag/Given 03/24/18 1313)     Initial Impression / Assessment and Plan / ED Course  I have reviewed the triage vital signs and the nursing notes.  Pertinent labs & imaging results that were available during my care of the  patient were reviewed by me and considered in my medical decision making (see chart for details).     Patient comes in with chief complaint of altered mental status.  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 fib and seizures.  He is also having a chronic wound in the leg with cellulitis.  It appears that his cellulitis is getting worse and based on the description from his wife he had an absent seizure-like episode.  Our thoughts are that patient might have had a seizure and is postictal.  Patient is on oxycodone, therefore medication side effect is also possible. Other possibility, especially given that he has not rebounded to baseline normal is that patient might have had a stroke.  It is hard to get last normal for the mental status, as wife states that patient has been sleepy for the past few days now.   Plan is to get sed rate and CRP, and to start patient on IV antibiotics for cellulitis. We will also consult tele-neurology for recommendations on TIA-stroke work-up versus EEG for seizure work-up.  4:10 PM CT scan of the head is normal.  Depakote level is slightly low. Sed rate and CRP are only mildly elevated with a normal white count.  Although I think patient needs wound care consultation and debridement and possible work-up for osteomyelitis if he is not getting better,  I do not think that his altered mental status is directly related to the localized lower extremity infection.  Telemetry neurology assessment has been delayed because of acute code stroke called on the floor.  I have requested the charge nurse to ask for the machine to be sent to the ER so that patient  can get telemetry neurology assessment.  Medicine team is already seen the patient, but there awaiting neurology recommendations. Family has been made aware of the reasons behind delay and they are understanding.  Final Clinical Impressions(s) / ED Diagnoses   Final diagnoses:  Disorientation  Witnessed seizure-like activity [facility])    ED Discharge Orders    None       Varney Biles, MD 03/24/18 1614

## 2018-03-24 NOTE — H&P (Signed)
History and Physical    [name] AYT:[id number] DOB: 11/21/1933 DOA: 03/24/2018  PCP: [name], MD  Patient coming from: home  I have personally briefly reviewed patient's old medical records in [place]  Chief Complaint: encephalopathy, cva vs subclinical szr  HPI: [name] is a 85 y.o. male with medical history significant of seizure disorder, stroke, atrial fibrillation off anticoagulation since roughly June secondary lower extremity hematoma falls persistent lower extremity colitis who presented to the ER with wife reporting a 2-day history of altered mental status, reports progressive mental status decline with unresponsiveness.  Patient's not tracking in the ER is unable to answer any questions.  He was seen by tele-neurology with concerns for subclinical seizures versus stroke.  Further discussions with neuro hospitalist with recommendations for transfer to [facility] for further work-up and evaluation.  ED Course: Neuro hospitalist recommended loading dose of valproic acid 10 meg per kg x1, increase daily dosing to 500 twice daily.  CT of head was negative CTA of head neck is pending.  MRI is pending.  Patient temperature 96.8 most recent vitals 82 respirations 12 blood pressure 139/78 satting 95% room air  Review of Systems: As per HPI otherwise 10 point review of systems negative.   Past Medical History:  Diagnosis Date  . Abnormality of gait 09/22/2015  . Arthritis   . Atrial fibrillation ([place])   . CAD (coronary artery disease)    Stent to RCA, Penta stent, 99% reduced to 0% 2002.  . Cancer ([place])    skin CA removed from back  . Chronic insomnia 03/30/2015  . Complication of anesthesia    trouble waking up  . Constipation    from medications taken  . GERD (gastroesophageal reflux disease)   . Hypercholesteremia   . Hypertension   . Hypothyroidism   . Memory difficulty 09/22/2015  . Osteoarthritis   . Seizures ([place])   . Sepsis ([place]) 05/2017  .  Transient alteration of awareness 03/30/2015  . Vertigo    hx of    Past Surgical History:  Procedure Laterality Date  . BACK SURGERY    . EYE SURGERY     Bilateral Cataract surgery   . HERNIA REPAIR    . I&D EXTREMITY Right 05/10/2015   Procedure: IRRIGATION AND DEBRIDEMENT EXTREMITY;  Surgeon: Leanora Cover, MD;  Location: [facility];  Service: Orthopedics;  Laterality: Right;  . KNEE ARTHROPLASTY     right knee X 2; left knee once  . LAMINECTOMY     X 6  . POSTERIOR CERVICAL FUSION/FORAMINOTOMY  01/28/2012   Procedure: POSTERIOR CERVICAL FUSION/FORAMINOTOMY LEVEL 3;  Surgeon: [name], MD;  Location: [facility];  Service: Neurosurgery;  Laterality: Left;  Posterior Cervical Five-Thoracic One Fusion, Arthrodesis with LEFT Cervical Seven-thoracic One Laminectomy, Foraminotomy and Resection of Synovial Cyst  . POSTERIOR CERVICAL FUSION/FORAMINOTOMY N/A 01/29/2013   Procedure: POSTERIOR CERVICAL FUSION/FORAMINOTOMY LEVEL 1 and C2-5 Posteriolateral Arthrodesis;  Surgeon: [name], MD;  Location: [facility];  Service: Neurosurgery;  Laterality: N/A;  C2-C3 Laminectomy C2-C3 posterior cervical arthrodesis  . TONSILLECTOMY       reports that he has quit smoking. He has never used smokeless tobacco. He reports current alcohol use. He reports that he does not use drugs.  Allergies  Allergen Reactions  . Eliquis [Apixaban] Other (See Comments)    bleeding  . Demerol [Meperidine] Nausea And Vomiting  . Keppra [Levetiracetam] Other (See Comments)    Causes  sleepiness, mental status changes    Family History  Problem Relation Age of Onset  . Hypertension Mother   . Cancer Mother   . Kidney failure Father   . Heart disease Father      Prior to Admission medications   Medication Sig Start Date End Date Taking? Authorizing Provider  acetaminophen (TYLENOL) 325 MG tablet Take 650 mg by mouth daily as needed for moderate pain.    Yes [provider]  acetaminophen (TYLENOL) 500 MG tablet Take 500 mg by mouth daily as needed for moderate pain.   Yes [provider]  alendronate (FOSAMAX) 70 MG tablet Take 70 mg by mouth once a week. On Wednesday. Remain upright for 30-60 minutes. 01/06/12  Yes [provider]  Amino Acids-Protein Hydrolys (FEEDING SUPPLEMENT, PRO-STAT SUGAR FREE 64,) LIQD Take 30 mLs by mouth 2 (two) times daily. 07/05/17  Yes Lavina Hamman, MD  aspirin EC 81 MG tablet Take 1 tablet (81 mg total) by mouth daily. 10/15/17  Yes Barrett, Evelene Croon, PA-C  calcium carbonate (OS-CAL) 600 MG tablet Take 600 mg by mouth daily after breakfast.    Yes [provider]  cephALEXin (KEFLEX) 500 MG capsule Take 1 capsule (500 mg total) by mouth 3 (three) times daily for 7 days. 03/19/18 03/26/18 Yes Emokpae, Courage, MD  cyanocobalamin 500 MCG tablet Take 500 mcg by mouth at bedtime.   Yes [provider]  diclofenac sodium (VOLTAREN) 1 % GEL Apply 2 g topically daily as needed (pain).   Yes [provider]  diltiazem (CARDIZEM CD) 300 MG 24 hr capsule TAKE 1 CAPSULE(300 MG) BY MOUTH EVERY MORNING. DO NOT CRUSH Patient taking differently: Take 300 mg by mouth daily.  12/03/17  Yes Croitoru, Mihai, MD  divalproex (DEPAKOTE) 250 MG DR tablet Take 250 mg by mouth every morning.   Yes [provider]  divalproex (DEPAKOTE) 250 MG DR tablet Take 500 mg by mouth every evening.    Yes [provider]  doxazosin (CARDURA) 4 MG tablet Take 4 mg by mouth every evening.    Yes [provider]  doxycycline (VIBRA-TABS) 100 MG tablet Take 1 tablet (100 mg total) by mouth 2 (two) times daily for 7 days. 03/19/18 03/26/18 Yes Emokpae, Courage, MD  ferrous sulfate 325 (65 FE) MG tablet Take 1 tablet (325 mg total) by mouth 2 (two) times daily with a meal. 06/28/17  Yes Skeet Latch, MD  fluticasone [facility]) 50 MCG/ACT nasal spray Place 2 sprays into both nostrils daily as  needed for allergies or rhinitis.   Yes [provider]  furosemide (LASIX) 40 MG tablet Take 1 tablet (40 mg total) by mouth every morning. Or per cardiologist instructions since patient cannot be weighed 11/28/17  Yes Croitoru, Mihai, MD  HYDROcodone-acetaminophen (NORCO) 10-325 MG tablet Take 1 tablet by mouth every 4 (four) hours as needed for moderate pain. 03/19/18  Yes Roxan Hockey, MD  lactobacillus acidophilus & bulgar (LACTINEX) chewable tablet Chew 1 tablet by mouth 3 (three) times daily with meals. 03/19/18  Yes Roxan Hockey, MD  levothyroxine (SYNTHROID, LEVOTHROID) 150 MCG tablet Take 150 mcg by mouth daily before breakfast. 12/27/11  Yes [provider]  lidocaine (LIDODERM) 5 % Place 1 patch onto the skin daily as needed. Remove & Discard patch within 12 hours or as directed by MD Patient taking differently: Place 1 patch onto the skin daily as needed (pain). Remove & Discard patch within 12 hours or as directed  by MD  06/01/17  Yes Mikhail, Velta Addison, DO  Menthol-Methyl Salicylate (MUSCLE RUB EX) Apply 1 application topically daily as needed (shoulder pain).   Yes [provider]  metoprolol tartrate (LOPRESSOR) 25 MG tablet Take 1 tablet (25 mg total) by mouth 2 (two) times daily. Take with or immediately following a meal. 11/06/17  Yes Croitoru, Mihai, MD  Multiple Vitamins-Minerals (CERTAVITE/ANTIOXIDANTS) TABS Take 1 tablet by mouth every evening.    Yes [provider]  omeprazole (PRILOSEC) 20 MG capsule Take 20 mg by mouth daily before breakfast.  12/23/11  Yes [provider]  potassium chloride SA (K-DUR,KLOR-CON) 20 MEQ tablet TAKE 1 TABLET BY MOUTH DAILY ALONG WITH LASIX 40MG  OR AS DIRECTED BY CARDIOLOGIST 03/11/18  Yes Croitoru, Mihai, MD  pravastatin (PRAVACHOL) 40 MG tablet Take 40 mg by mouth every evening.  10/29/14  Yes [provider]  predniSONE (DELTASONE) 5 MG tablet Take 1 tablet (5 mg total) by mouth daily with  breakfast. Patient taking differently: Take 10 mg by mouth daily with breakfast.  03/19/18  Yes Emokpae, Courage, MD  sodium chloride (OCEAN) 0.65 % SOLN nasal spray Place 1 spray into both nostrils as needed for congestion.   Yes [provider]  VITAMIN D, ERGOCALCIFEROL, PO Take 1 capsule by mouth daily.    Yes [provider]  ZETIA 10 MG tablet Take 5 mg by mouth at bedtime. (0.5 tablet) 10/15/11  Yes [provider]  nitroGLYCERIN (NITROSTAT) 0.4 MG SL tablet Place 0.4 mg under the tongue every 5 (five) minutes as needed for chest pain. Max of 3 tablets    [provider]    Physical Exam: Vitals:   03/24/18 1500 03/24/18 1534 03/24/18 1651 03/24/18 1813  BP: 127/65 130/67 (!) 142/87 139/78  Pulse: 86 85 78 82  Resp: 11 10 12 12   Temp:      TempSrc:      SpO2: 98% 97% 100% 95%  Weight:      Height:        Constitutional: NAD, calm, comfortable Vitals:   03/24/18 1500 03/24/18 1534 03/24/18 1651 03/24/18 1813  BP: 127/65 130/67 (!) 142/87 139/78  Pulse: 86 85 78 82  Resp: 11 10 12 12   Temp:      TempSrc:      SpO2: 98% 97% 100% 95%  Weight:      Height:       Eyes: PERRL, lids and conjunctivae normal, eom not tracking ENMT: Mucous membranes are moist. Posterior pharynx clear of any exudate or lesions.Normal dentition.  Neck: normal, supple, no masses, no thyromegaly Respiratory: Mild rhonchi bilaterally no accessory muscle use Cardiovascular: Regular rate and rhythm, no murmurs / rubs / gallops. No extremity edema. 2+ pedal pulses. No carotid bruits.  Abdomen: no tenderness, no masses palpated. No hepatosplenomegaly. Bowel sounds positive.  Musculoskeletal: no clubbing / cyanosis. No joint deformity upper and lower extremities. Good ROM, no contractures. Normal muscle tone.  Skin: Warm and erythema right lower extremity consistent with known cellulitis, no weeping Neurologic: Patient not following commands, not tracking, Psychiatric:  Unable to accurately assess psychiatric condition secondary to patient's encephalopathy    Labs on Admission: I have personally reviewed following labs and imaging studies  CBC: Recent Labs  Lab 03/18/18 0530 03/19/18 0543 03/24/18 1026  WBC 7.0 7.0 8.6  NEUTROABS 5.1 4.6 5.5  HGB 9.1* 9.0* 9.6*  HCT 29.6* 29.2* 31.7*  MCV 104.2* 103.5* 107.5*  PLT 152 169 238   Basic  Metabolic Panel: Recent Labs  Lab 03/18/18 0530 03/19/18 0543 03/24/18 1026  NA 138 139 141  K 4.0 4.0 3.7  CL 106 109 106  CO2 23 22 24   GLUCOSE 88 83 163*  BUN 44* 39* 47*  CREATININE 1.60* 1.24 1.39*  CALCIUM 7.5* 7.8* 8.6*  PHOS 3.6 3.1  --    GFR: Estimated Creatinine Clearance: 41 mL/min (A) (by C-G formula based on SCr of 1.39 mg/dL (H)). Liver Function Tests: Recent Labs  Lab 03/18/18 0530 03/19/18 0543 03/24/18 1026  AST  --   --  43*  ALT  --   --  56*  ALKPHOS  --   --  70  BILITOT  --   --  0.6  PROT  --   --  5.8*  ALBUMIN 2.7* 2.6* 3.2*   No results for input(s): LIPASE, AMYLASE in the last 168 hours. No results for input(s): AMMONIA in the last 168 hours. Coagulation Profile: Recent Labs  Lab 03/24/18 1026  INR 1.16   Cardiac Enzymes: No results for input(s): CKTOTAL, CKMB, CKMBINDEX, TROPONINI in the last 168 hours. BNP (last 3 results) Recent Labs    09/30/17 1107 10/22/17 1120 11/22/17 1130  PROBNP 3,272* 3,243* 3,962*   HbA1C: No results for input(s): HGBA1C in the last 72 hours. CBG: No results for input(s): GLUCAP in the last 168 hours. Lipid Profile: No results for input(s): CHOL, HDL, LDLCALC, TRIG, CHOLHDL, LDLDIRECT in the last 72 hours. Thyroid Function Tests: No results for input(s): TSH, T4TOTAL, FREET4, T3FREE, THYROIDAB in the last 72 hours. Anemia Panel: No results for input(s): VITAMINB12, FOLATE, FERRITIN, TIBC, IRON, RETICCTPCT in the last 72 hours. Urine analysis:    Component Value Date/Time   COLORURINE YELLOW 03/15/2018 [facility] [place] 03/15/2018 1437   LABSPEC 1.017 03/15/2018 1437   PHURINE 5.0 03/15/2018 1437   GLUCOSEU NEGATIVE 03/15/2018 1437   HGBUR NEGATIVE 03/15/2018 1437   BILIRUBINUR NEGATIVE 03/15/2018 1437   KETONESUR 5 (A) 03/15/2018 1437   PROTEINUR NEGATIVE 03/15/2018 1437   UROBILINOGEN 1.0 09/16/2007 1212   NITRITE NEGATIVE 03/15/2018 1437   LEUKOCYTESUR NEGATIVE 03/15/2018 1437    Radiological Exams on Admission: Dg Chest 2 View  Result Date: 03/24/2018 CLINICAL DATA:  Sepsis, weakness. EXAM: CHEST - 2 VIEW COMPARISON:  Radiograph March 13, 2018. FINDINGS: Stable cardiomegaly. No pneumothorax is noted. Right lung is clear. Stable elevated left hemidiaphragm is noted with minimal left basilar subsegmental atelectasis. Small left pleural effusion is noted. Bony thorax is unremarkable. IMPRESSION: Stable elevated left hemidiaphragm with minimal left basilar atelectasis. Small left pleural effusion is noted. Electronically Signed   By: Marijo Conception, M.D.   On: 03/24/2018 10:44   Ct Head Wo Contrast  Result Date: 03/24/2018 CLINICAL DATA:  Altered level of consciousness EXAM: CT HEAD WITHOUT CONTRAST TECHNIQUE: Contiguous axial images were obtained from the base of the skull through the vertex without intravenous contrast. COMPARISON:  04/17/2017 FINDINGS: Brain: No evidence of acute infarction, hemorrhage, extra-axial collection, ventriculomegaly, or mass effect. Generalized cerebral atrophy. Periventricular white matter low attenuation likely secondary to microangiopathy. Vascular: Cerebrovascular atherosclerotic calcifications are noted. Skull: Negative for fracture or focal lesion. Retro-dental pannus formation. Sinuses/Orbits: Visualized portions of the orbits are unremarkable. Visualized portions of the paranasal sinuses and mastoid air cells are unremarkable. Other: None. IMPRESSION: 1. No acute intracranial pathology. 2. Chronic microvascular disease and cerebral atrophy.  Electronically Signed   By: Kathreen Devoid   On: 03/24/2018 13:19  EKG: Independently reviewed. Atrial fib  Assessment/Plan Principal Problem:   Altered mental status Active Problems:   Essential hypertension   Seizure disorder (HCC)   GERD (gastroesophageal reflux disease)   Memory difficulty   CKD (chronic kidney disease) stage 3, GFR 30-59 ml/min (HCC)   Chronic diastolic (congestive) heart failure (HCC)   Chronic atrial fibrillation   Coronary artery disease involving native coronary artery of native heart without angina pectoris   Cellulitis   TIA (transient ischemic attack)   Encephalopathy    Altered mental status/encephalopathy/tia/ vs cva.  Presentation concerning for stroke for subclinical seizures.  Was seen by tele-neurology with recommendations for increasing Depakote.  Also discussed with neuro hospitalists.  Patient be transferred to [facility] for evaluation for underlying stroke first seizures.  Recommended loading dose of 10 mg/kg valproic acid, CT of head was negative for acute bleed CTA of head and neck is pending, mri ordered  Seizure disorder.  Patient with known history of absence seizure's.  Adjustment anti-epileptics as above.  Telemetry unit,  Neurology to follow  Chronic A. fib.  Continue patient's nodal blocking agents.  Patient is a former Software engineer and self DC'd anticoagulation secondary to significant left lower extremity hematoma.  If MRI positive might consider echocardiogram.  Lower extremity cellulitis.  Patient on outpatient antibiotics.  Cellulitis still persistent with warmth and erythema continue with broad-spectrum antibiotics, I will add blood cultures to patient's orders follow with CBC patient is afebrile and hemodynamically stable.    DVT prophylaxis: scd Code Status: full code for now, discussed with wife who said if pt has large cva then she will convert to dnr Family Communication: wife at bedside Disposition Plan: home vs  snf 1-2 day Consults called: neurohosp eric lindzen Admission status: obs tele   Nicolette Bang MD [facility]  If 7PM-7AM, please contact night-coverage [email or web address] Password [facility] [place]  03/24/2018, 6:21 PM

## 2018-03-24 NOTE — ED Notes (Signed)
Neuro Assessment complete by Tele-Neuro Machine. Neurologist to call ED provider with recommendation.

## 2018-03-24 NOTE — ED Notes (Signed)
ED TO INPATIENT HANDOFF REPORT  Name/Age/Gender [name] 85 y.o. male  Code Status Code Status History    Date Active Date Inactive Code Status Order ID Comments User Context   03/16/2018 1716 03/20/2018 1921 Full Code [id number]  Marletta Lor, RN Inpatient   03/15/2018 2016 03/16/2018 1716 DNR [id number]  Elmarie Shiley, MD Inpatient   07/11/2017 0616 07/19/2017 1752 DNR [id number]  [name], DO ED   07/03/2017 1232 07/05/2017 1736 DNR [id number]  [name], NP Inpatient   07/01/2017 2127 07/03/2017 1232 Full Code [id number]  [name], MD Inpatient   05/24/2017 0348 06/01/2017 2026 Full Code [id number]  [name], MD ED   04/17/2017 2317 04/25/2017 1955 Full Code [id number]  [name], MD ED   07/06/2015 0919 07/11/2015 1902 Full Code [id number]  [name], MD ED   01/31/2015 1837 02/05/2015 1735 Full Code [id number]  [name], MD Inpatient   01/28/2013 2137 01/31/2013 1359 Full Code [id number]  [name], MD ED   01/28/2012 1808 01/30/2012 1847 Full Code [id number]  [name], [name], RN Inpatient      Home/SNF/Other Given to floor  Chief Complaint infection poss sepsis  Level of Care/Admitting Diagnosis ED Disposition    ED Disposition Condition [place]: [place] [100100]  Level of Care: Medical Telemetry [104]  I expect the patient will be discharged within 24 hours: No (not a candidate for 5C-Observation unit)  Diagnosis: Encephalopathy [237628]  Admitting Physician: Marcell Anger [[id number]]  Attending Physician: Marcell Anger [[id number]]  PT Class (Do Not Modify): Observation [104]  PT Acc Code (Do Not Modify): Observation [10022]       Medical History Past Medical History:  Diagnosis Date  . Abnormality of gait 09/22/2015  . Arthritis   . Atrial fibrillation ([place])   . CAD (coronary artery disease)    Stent to RCA, Penta stent, 99% reduced to 0% 2002.   . Cancer ([place])    skin CA removed from back  . Chronic insomnia 03/30/2015  . Complication of anesthesia    trouble waking up  . Constipation    from medications taken  . GERD (gastroesophageal reflux disease)   . Hypercholesteremia   . Hypertension   . Hypothyroidism   . Memory difficulty 09/22/2015  . Osteoarthritis   . Seizures ([place])   . Sepsis ([place]) 05/2017  . Transient alteration of awareness 03/30/2015  . Vertigo    hx of    Allergies Allergies  Allergen Reactions  . Eliquis [Apixaban] Other (See Comments)    bleeding  . Demerol [Meperidine] Nausea And Vomiting  . Keppra [Levetiracetam] Other (See Comments)    Causes sleepiness, mental status changes    IV Location/Drains/Wounds Patient Lines/Drains/Airways Status   Active Line/Drains/Airways    Name:   Placement date:   Placement time:   Site:   Days:   Peripheral IV 03/24/18 Left Forearm   03/24/18    0959    Forearm   less than 1   Peripheral IV 03/24/18 Antecubital   03/24/18    1023    Antecubital   less than 1   External Urinary Catheter   03/20/18    0517    -   4   Wound / Incision (Open or Dehisced) 03/16/18 Non-pressure wound Ankle Right;Distal Per wife, was a large blister that has popped. Several smaller blisters appear to be forming on surrounding  area   03/16/18    0900    Ankle   8   Wound / Incision (Open or Dehisced) 03/16/18 Other (Comment) Ankle Right;Distal Deep wound, per wife has been present for over a year- tendon visible   03/16/18    0900    Ankle   8          Labs/Imaging Results for orders placed or performed during the hospital encounter of 03/24/18 (from the past 48 hour(s))  Comprehensive metabolic panel     Status: Abnormal   Collection Time: 03/24/18 10:26 AM  Result Value Ref Range   Sodium 141 135 - 145 mmol/L   Potassium 3.7 3.5 - 5.1 mmol/L   Chloride 106 98 - 111 mmol/L   CO2 24 22 - 32 mmol/L   Glucose, Bld 163 (H) 70 - 99 mg/dL   BUN 47 (H) 8 - 23 mg/dL   Creatinine,  Ser 1.39 (H) 0.61 - 1.24 mg/dL   Calcium 8.6 (L) 8.9 - 10.3 mg/dL   Total Protein 5.8 (L) 6.5 - 8.1 g/dL   Albumin 3.2 (L) 3.5 - 5.0 g/dL   AST 43 (H) 15 - 41 U/L   ALT 56 (H) 0 - 44 U/L   Alkaline Phosphatase 70 38 - 126 U/L   Total Bilirubin 0.6 0.3 - 1.2 mg/dL   GFR calc non Af Amer 47 (L) >60 mL/min   GFR calc Af Amer 54 (L) >60 mL/min   Anion gap 11 5 - 15    Comment: Performed at [facility], [place] [address], [place], [place] [address]  CBC with Differential     Status: Abnormal   Collection Time: 03/24/18 10:26 AM  Result Value Ref Range   WBC 8.6 4.0 - 10.5 K/uL   RBC 2.95 (L) 4.22 - 5.81 MIL/uL   Hemoglobin 9.6 (L) 13.0 - 17.0 g/dL   HCT 31.7 (L) 39.0 - 52.0 %   MCV 107.5 (H) 80.0 - 100.0 fL   MCH 32.5 26.0 - 34.0 pg   MCHC 30.3 30.0 - 36.0 g/dL   RDW 14.6 11.5 - 15.5 %   Platelets 238 150 - 400 K/uL   nRBC 0.0 0.0 - 0.2 %   Neutrophils Relative % 62 %   Neutro Abs 5.5 1.7 - 7.7 K/uL   Lymphocytes Relative 12 %   Lymphs Abs 1.0 0.7 - 4.0 K/uL   Monocytes Relative 11 %   Monocytes Absolute 0.9 0.1 - 1.0 K/uL   Eosinophils Relative 1 %   Eosinophils Absolute 0.1 0.0 - 0.5 K/uL   Basophils Relative 1 %   Basophils Absolute 0.1 0.0 - 0.1 K/uL   Immature Granulocytes 13 %   Abs Immature Granulocytes 1.11 (H) 0.00 - 0.07 K/uL   Polychromasia PRESENT     Comment: Performed at [facility], [place] [address] [place], [place] [address]  Protime-INR     Status: None   Collection Time: 03/24/18 10:26 AM  Result Value Ref Range   Prothrombin Time 14.7 11.4 - 15.2 seconds   INR 1.16     Comment: Performed at [facility], [place] [address] [place], [place] [address]  Sedimentation rate     Status: Abnormal   Collection Time: 03/24/18 10:26 AM  Result Value Ref Range   Sed Rate 24 (H) 0 - 16 mm/hr    Comment: Performed at [facility], [place] [address], [place], [place] [address]  I-Stat CG4 Lactic  Acid, ED     Status: None   Collection Time: 03/24/18 10:27 AM  Result Value Ref Range   Lactic Acid, Venous 1.00 0.5 - 1.9 mmol/L  C-reactive protein     Status: Abnormal   Collection Time: 03/24/18 10:27 AM  Result Value Ref Range   CRP 1.8 (H) <1.0 mg/dL    Comment: Performed at [facility], [place] [address] [place], [place] [address]  Valproic acid level     Status: Abnormal   Collection Time: 03/24/18 10:27 AM  Result Value Ref Range   Valproic Acid Lvl 37 (L) 50.0 - 100.0 ug/mL    Comment: Performed at [facility], [place] [address], [place], [place] [address]   Ct Angio Head W Or Wo Contrast  Result Date: 03/24/2018 CLINICAL DATA:  History of atrial fibrillation with altered mental status. EXAM: CT ANGIOGRAPHY HEAD AND NECK TECHNIQUE: Multidetector CT imaging of the head and neck was performed using the standard protocol during bolus administration of intravenous contrast. Multiplanar CT image reconstructions and MIPs were obtained to evaluate the vascular anatomy. Carotid stenosis measurements (when applicable) are obtained utilizing NASCET criteria, using the distal internal carotid diameter as the denominator. CONTRAST:  143mL ISOVUE-370 IOPAMIDOL (ISOVUE-370) INJECTION 76% COMPARISON:  Head CT 07/01/2017 FINDINGS: CTA NECK FINDINGS SKELETON: Long segment cervical spinal fusion, anteriorly from C3 to C6 and posteriorly from C2-T1. OTHER NECK: Normal pharynx, larynx and major salivary glands. No cervical lymphadenopathy. Unremarkable thyroid gland. UPPER CHEST: No pneumothorax or pleural effusion. No nodules or masses. AORTIC ARCH: There is no calcific atherosclerosis of the aortic arch. There is no aneurysm, dissection or hemodynamically significant stenosis of the visualized ascending aorta and aortic arch. Conventional 3 vessel aortic branching pattern. The visualized proximal subclavian arteries are widely patent. RIGHT CAROTID SYSTEM: --Common  carotid artery: Widely patent origin without common carotid artery dissection or aneurysm. --Internal carotid artery: No dissection, occlusion or aneurysm. Mild atherosclerotic calcification at the carotid bifurcation without hemodynamically significant stenosis. --External carotid artery: No acute abnormality. LEFT CAROTID SYSTEM: --Common carotid artery: Widely patent origin without common carotid artery dissection or aneurysm. --Internal carotid artery: No dissection, occlusion or aneurysm. Mild atherosclerotic calcification at the carotid bifurcation without hemodynamically significant stenosis. --External carotid artery: No acute abnormality. VERTEBRAL ARTERIES: Codominant configuration. There is atherosclerotic calcification of both vertebral artery origins. There is atherosclerotic calcification of both V4 segments. The V2 and V3 segments are normal. CTA HEAD FINDINGS POSTERIOR CIRCULATION: --Basilar artery: Normal. --Posterior cerebral arteries: Both are predominantly supplied by the posterior communicating arteries. --Superior cerebellar arteries: Normal. --Inferior cerebellar arteries: Normal anterior and posterior inferior cerebellar arteries. ANTERIOR CIRCULATION: --Intracranial internal carotid arteries: Atherosclerotic calcification of the internal carotid arteries at the skull base without hemodynamically significant stenosis. --Anterior cerebral arteries: Normal. Both A1 segments are present. Patent anterior communicating artery. --Middle cerebral arteries: Normal. --Posterior communicating arteries: Present bilaterally. VENOUS SINUSES: As permitted by contrast timing, patent. ANATOMIC VARIANTS: None DELAYED PHASE: No parenchymal contrast enhancement. Review of the MIP images confirms the above findings. IMPRESSION: No emergent large vessel occlusion or hemodynamically significant stenosis of the arteries of the head and neck. Electronically Signed   By: Ulyses Jarred M.D.   On: 03/24/2018 18:39    Dg Chest 2 View  Result Date: 03/24/2018 CLINICAL DATA:  Sepsis, weakness. EXAM: CHEST - 2 VIEW COMPARISON:  Radiograph March 13, 2018. FINDINGS: Stable cardiomegaly. No pneumothorax is noted. Right lung is clear. Stable elevated left hemidiaphragm is noted with minimal  left basilar subsegmental atelectasis. Small left pleural effusion is noted. Bony thorax is unremarkable. IMPRESSION: Stable elevated left hemidiaphragm with minimal left basilar atelectasis. Small left pleural effusion is noted. Electronically Signed   By: Marijo Conception, M.D.   On: 03/24/2018 10:44   Ct Head Wo Contrast  Result Date: 03/24/2018 CLINICAL DATA:  Altered level of consciousness EXAM: CT HEAD WITHOUT CONTRAST TECHNIQUE: Contiguous axial images were obtained from the base of the skull through the vertex without intravenous contrast. COMPARISON:  04/17/2017 FINDINGS: Brain: No evidence of acute infarction, hemorrhage, extra-axial collection, ventriculomegaly, or mass effect. Generalized cerebral atrophy. Periventricular white matter low attenuation likely secondary to microangiopathy. Vascular: Cerebrovascular atherosclerotic calcifications are noted. Skull: Negative for fracture or focal lesion. Retro-dental pannus formation. Sinuses/Orbits: Visualized portions of the orbits are unremarkable. Visualized portions of the paranasal sinuses and mastoid air cells are unremarkable. Other: None. IMPRESSION: 1. No acute intracranial pathology. 2. Chronic microvascular disease and cerebral atrophy. Electronically Signed   By: Kathreen Devoid   On: 03/24/2018 13:19   Ct Angio Neck W Or Wo Contrast  Result Date: 03/24/2018 CLINICAL DATA:  History of atrial fibrillation with altered mental status. EXAM: CT ANGIOGRAPHY HEAD AND NECK TECHNIQUE: Multidetector CT imaging of the head and neck was performed using the standard protocol during bolus administration of intravenous contrast. Multiplanar CT image reconstructions and MIPs were  obtained to evaluate the vascular anatomy. Carotid stenosis measurements (when applicable) are obtained utilizing NASCET criteria, using the distal internal carotid diameter as the denominator. CONTRAST:  175mL ISOVUE-370 IOPAMIDOL (ISOVUE-370) INJECTION 76% COMPARISON:  Head CT 07/01/2017 FINDINGS: CTA NECK FINDINGS SKELETON: Long segment cervical spinal fusion, anteriorly from C3 to C6 and posteriorly from C2-T1. OTHER NECK: Normal pharynx, larynx and major salivary glands. No cervical lymphadenopathy. Unremarkable thyroid gland. UPPER CHEST: No pneumothorax or pleural effusion. No nodules or masses. AORTIC ARCH: There is no calcific atherosclerosis of the aortic arch. There is no aneurysm, dissection or hemodynamically significant stenosis of the visualized ascending aorta and aortic arch. Conventional 3 vessel aortic branching pattern. The visualized proximal subclavian arteries are widely patent. RIGHT CAROTID SYSTEM: --Common carotid artery: Widely patent origin without common carotid artery dissection or aneurysm. --Internal carotid artery: No dissection, occlusion or aneurysm. Mild atherosclerotic calcification at the carotid bifurcation without hemodynamically significant stenosis. --External carotid artery: No acute abnormality. LEFT CAROTID SYSTEM: --Common carotid artery: Widely patent origin without common carotid artery dissection or aneurysm. --Internal carotid artery: No dissection, occlusion or aneurysm. Mild atherosclerotic calcification at the carotid bifurcation without hemodynamically significant stenosis. --External carotid artery: No acute abnormality. VERTEBRAL ARTERIES: Codominant configuration. There is atherosclerotic calcification of both vertebral artery origins. There is atherosclerotic calcification of both V4 segments. The V2 and V3 segments are normal. CTA HEAD FINDINGS POSTERIOR CIRCULATION: --Basilar artery: Normal. --Posterior cerebral arteries: Both are predominantly supplied by  the posterior communicating arteries. --Superior cerebellar arteries: Normal. --Inferior cerebellar arteries: Normal anterior and posterior inferior cerebellar arteries. ANTERIOR CIRCULATION: --Intracranial internal carotid arteries: Atherosclerotic calcification of the internal carotid arteries at the skull base without hemodynamically significant stenosis. --Anterior cerebral arteries: Normal. Both A1 segments are present. Patent anterior communicating artery. --Middle cerebral arteries: Normal. --Posterior communicating arteries: Present bilaterally. VENOUS SINUSES: As permitted by contrast timing, patent. ANATOMIC VARIANTS: None DELAYED PHASE: No parenchymal contrast enhancement. Review of the MIP images confirms the above findings. IMPRESSION: No emergent large vessel occlusion or hemodynamically significant stenosis of the arteries of the head and neck. Electronically Signed   By: Lennette Bihari  [place] M.D.   On: 03/24/2018 18:39    Pending Labs Unresulted Labs (From admission, onward)    Start     Ordered   03/24/18 1038  Urine culture  ONCE - STAT,   STAT     03/24/18 1038   03/24/18 1022  Culture, blood (Routine x 2)  BLOOD CULTURE X 2,   STAT     03/24/18 1021   03/24/18 1022  Urinalysis, Routine w reflex microscopic  ONCE - STAT,   STAT     03/24/18 1021   Signed and Held  Hemoglobin A1c  Tomorrow morning,   R     Signed and Held   Signed and Held  Lipid panel  Tomorrow morning,   R    Comments:  Fasting    Signed and Held          Vitals/Pain Today's Vitals   03/24/18 1830 03/24/18 1845 03/24/18 1900 03/24/18 1932  BP: 129/60 129/60 128/66 135/81  Pulse: 84 86 92 87  Resp: 15 12 13 14   Temp:      TempSrc:      SpO2: 93% 92% 95% 100%  Weight:      Height:      PainSc:        Isolation Precautions No active isolations  Medications Medications  metroNIDAZOLE (FLAGYL) IVPB 500 mg (500 mg Intravenous New Bag/Given 03/24/18 1915)  sodium chloride (PF) 0.9 % injection (has no  administration in time range)  iopamidol (ISOVUE-370) 76 % injection (has no administration in time range)  valproate (DEPACON) 750 mg in dextrose 5 % 50 mL IVPB (750 mg Intravenous New Bag/Given 03/24/18 1916)  ceFEPIme (MAXIPIME) 1 g in sodium chloride 0.9 % 100 mL IVPB (has no administration in time range)  vancomycin (VANCOCIN) IVPB 1000 mg/200 mL premix (has no administration in time range)  sodium chloride 0.9 % bolus 500 mL (0 mLs Intravenous Stopped 03/24/18 1256)  0.9 %  sodium chloride infusion ( Intravenous New Bag/Given 03/24/18 1311)  ceFEPIme (MAXIPIME) 2 g in sodium chloride 0.9 % 100 mL IVPB (0 g Intravenous Stopped 03/24/18 1129)  vancomycin (VANCOCIN) IVPB 1000 mg/200 mL premix (0 mg Intravenous Stopped 03/24/18 1728)  iopamidol (ISOVUE-370) 76 % injection 100 mL (100 mLs Intravenous Contrast Given 03/24/18 1748)    Mobility non-ambulatory

## 2018-03-24 NOTE — ED Triage Notes (Signed)
Pt coming from home with an infection to his right lower leg.  Pt was seen and treated here and the infection was improving. However since being home, pt reports that the infection is getting worse.  Pt sees Dr. Romilda Joy and was at the clinic today and appeared to be "acting off."  EMS reports that pt is a/o x 4 but is very slow to respond to questions.

## 2018-03-25 ENCOUNTER — Observation Stay (HOSPITAL_COMMUNITY): Payer: Medicare Other

## 2018-03-25 ENCOUNTER — Other Ambulatory Visit: Payer: Self-pay

## 2018-03-25 DIAGNOSIS — G40209 Localization-related (focal) (partial) symptomatic epilepsy and epileptic syndromes with complex partial seizures, not intractable, without status epilepticus: Secondary | ICD-10-CM | POA: Diagnosis present

## 2018-03-25 DIAGNOSIS — I482 Chronic atrial fibrillation, unspecified: Secondary | ICD-10-CM | POA: Diagnosis not present

## 2018-03-25 DIAGNOSIS — I4821 Permanent atrial fibrillation: Secondary | ICD-10-CM | POA: Diagnosis present

## 2018-03-25 DIAGNOSIS — I82409 Acute embolism and thrombosis of unspecified deep veins of unspecified lower extremity: Secondary | ICD-10-CM | POA: Diagnosis not present

## 2018-03-25 DIAGNOSIS — G40A09 Absence epileptic syndrome, not intractable, without status epilepticus: Secondary | ICD-10-CM | POA: Diagnosis present

## 2018-03-25 DIAGNOSIS — G92 Toxic encephalopathy: Secondary | ICD-10-CM

## 2018-03-25 DIAGNOSIS — L89510 Pressure ulcer of right ankle, unstageable: Secondary | ICD-10-CM | POA: Diagnosis present

## 2018-03-25 DIAGNOSIS — L03115 Cellulitis of right lower limb: Secondary | ICD-10-CM

## 2018-03-25 DIAGNOSIS — I351 Nonrheumatic aortic (valve) insufficiency: Secondary | ICD-10-CM

## 2018-03-25 DIAGNOSIS — I472 Ventricular tachycardia: Secondary | ICD-10-CM | POA: Diagnosis not present

## 2018-03-25 DIAGNOSIS — G8918 Other acute postprocedural pain: Secondary | ICD-10-CM | POA: Diagnosis not present

## 2018-03-25 DIAGNOSIS — G40909 Epilepsy, unspecified, not intractable, without status epilepticus: Secondary | ICD-10-CM | POA: Diagnosis not present

## 2018-03-25 DIAGNOSIS — G9341 Metabolic encephalopathy: Secondary | ICD-10-CM | POA: Diagnosis not present

## 2018-03-25 DIAGNOSIS — I370 Nonrheumatic pulmonary valve stenosis: Secondary | ICD-10-CM

## 2018-03-25 DIAGNOSIS — K219 Gastro-esophageal reflux disease without esophagitis: Secondary | ICD-10-CM | POA: Diagnosis present

## 2018-03-25 DIAGNOSIS — E039 Hypothyroidism, unspecified: Secondary | ICD-10-CM | POA: Diagnosis present

## 2018-03-25 DIAGNOSIS — I63441 Cerebral infarction due to embolism of right cerebellar artery: Secondary | ICD-10-CM | POA: Diagnosis present

## 2018-03-25 DIAGNOSIS — N390 Urinary tract infection, site not specified: Secondary | ICD-10-CM | POA: Diagnosis present

## 2018-03-25 DIAGNOSIS — R45851 Suicidal ideations: Secondary | ICD-10-CM | POA: Diagnosis not present

## 2018-03-25 DIAGNOSIS — I10 Essential (primary) hypertension: Secondary | ICD-10-CM | POA: Diagnosis not present

## 2018-03-25 DIAGNOSIS — L03116 Cellulitis of left lower limb: Secondary | ICD-10-CM | POA: Diagnosis present

## 2018-03-25 DIAGNOSIS — S88111A Complete traumatic amputation at level between knee and ankle, right lower leg, initial encounter: Secondary | ICD-10-CM | POA: Diagnosis not present

## 2018-03-25 DIAGNOSIS — G8311 Monoplegia of lower limb affecting right dominant side: Secondary | ICD-10-CM | POA: Diagnosis not present

## 2018-03-25 DIAGNOSIS — I5032 Chronic diastolic (congestive) heart failure: Secondary | ICD-10-CM | POA: Diagnosis present

## 2018-03-25 DIAGNOSIS — Z89511 Acquired absence of right leg below knee: Secondary | ICD-10-CM | POA: Diagnosis not present

## 2018-03-25 DIAGNOSIS — I2510 Atherosclerotic heart disease of native coronary artery without angina pectoris: Secondary | ICD-10-CM | POA: Diagnosis present

## 2018-03-25 DIAGNOSIS — I96 Gangrene, not elsewhere classified: Secondary | ICD-10-CM | POA: Diagnosis present

## 2018-03-25 DIAGNOSIS — I361 Nonrheumatic tricuspid (valve) insufficiency: Secondary | ICD-10-CM

## 2018-03-25 DIAGNOSIS — R4182 Altered mental status, unspecified: Secondary | ICD-10-CM

## 2018-03-25 DIAGNOSIS — D539 Nutritional anemia, unspecified: Secondary | ICD-10-CM | POA: Diagnosis present

## 2018-03-25 DIAGNOSIS — I4589 Other specified conduction disorders: Secondary | ICD-10-CM | POA: Diagnosis present

## 2018-03-25 DIAGNOSIS — D631 Anemia in chronic kidney disease: Secondary | ICD-10-CM | POA: Diagnosis present

## 2018-03-25 DIAGNOSIS — R0682 Tachypnea, not elsewhere classified: Secondary | ICD-10-CM | POA: Diagnosis not present

## 2018-03-25 DIAGNOSIS — L97912 Non-pressure chronic ulcer of unspecified part of right lower leg with fat layer exposed: Secondary | ICD-10-CM

## 2018-03-25 DIAGNOSIS — I130 Hypertensive heart and chronic kidney disease with heart failure and stage 1 through stage 4 chronic kidney disease, or unspecified chronic kidney disease: Secondary | ICD-10-CM | POA: Diagnosis present

## 2018-03-25 DIAGNOSIS — R410 Disorientation, unspecified: Secondary | ICD-10-CM | POA: Diagnosis present

## 2018-03-25 DIAGNOSIS — N183 Chronic kidney disease, stage 3 (moderate): Secondary | ICD-10-CM | POA: Diagnosis present

## 2018-03-25 DIAGNOSIS — I4891 Unspecified atrial fibrillation: Secondary | ICD-10-CM | POA: Diagnosis not present

## 2018-03-25 DIAGNOSIS — M86271 Subacute osteomyelitis, right ankle and foot: Secondary | ICD-10-CM | POA: Diagnosis present

## 2018-03-25 DIAGNOSIS — I340 Nonrheumatic mitral (valve) insufficiency: Secondary | ICD-10-CM | POA: Diagnosis not present

## 2018-03-25 DIAGNOSIS — L03119 Cellulitis of unspecified part of limb: Secondary | ICD-10-CM | POA: Diagnosis not present

## 2018-03-25 DIAGNOSIS — E440 Moderate protein-calorie malnutrition: Secondary | ICD-10-CM | POA: Diagnosis present

## 2018-03-25 DIAGNOSIS — G9340 Encephalopathy, unspecified: Secondary | ICD-10-CM | POA: Diagnosis not present

## 2018-03-25 DIAGNOSIS — N179 Acute kidney failure, unspecified: Secondary | ICD-10-CM | POA: Diagnosis not present

## 2018-03-25 LAB — ECHOCARDIOGRAM COMPLETE
Height: 69 in
Weight: 2640 oz

## 2018-03-25 LAB — BASIC METABOLIC PANEL
Anion gap: 10 (ref 5–15)
BUN: 26 mg/dL — ABNORMAL HIGH (ref 8–23)
CO2: 25 mmol/L (ref 22–32)
Calcium: 8.4 mg/dL — ABNORMAL LOW (ref 8.9–10.3)
Chloride: 105 mmol/L (ref 98–111)
Creatinine, Ser: 1.14 mg/dL (ref 0.61–1.24)
GFR calc Af Amer: >60 mL/min (ref >60)
GFR calc non Af Amer: 60 mL/min — ABNORMAL LOW (ref >60)
Glucose, Bld: 107 mg/dL — ABNORMAL HIGH (ref 70–99)
Potassium: 3.9 mmol/L (ref 3.5–5.1)
Sodium: 140 mmol/L (ref 135–145)

## 2018-03-25 LAB — MAGNESIUM: Magnesium: 1.8 mg/dL (ref 1.7–2.4)

## 2018-03-25 LAB — GLUCOSE, CAPILLARY: Glucose-Capillary: 71 mg/dL (ref 70–99)

## 2018-03-25 MED ORDER — ASPIRIN EC 325 MG PO TBEC
325.0000 mg | DELAYED_RELEASE_TABLET | Freq: Every day | ORAL | Status: DC
  Administered 2018-03-26 – 2018-04-02 (×8): 325 mg via ORAL
  Filled 2018-03-25 (×8): qty 1

## 2018-03-25 MED ORDER — LORAZEPAM 2 MG/ML IJ SOLN
1.0000 mg | Freq: Once | INTRAMUSCULAR | Status: AC
  Administered 2018-03-26: 1 mg via INTRAVENOUS
  Filled 2018-03-25: qty 1

## 2018-03-25 NOTE — Evaluation (Signed)
Speech Language Pathology Evaluation Patient Details Name: [name] MRN: [id number] DOB: 16-Oct-1933 Today's Date: 03/25/2018 Time: 4650-3546 SLP Time Calculation (min) (ACUTE ONLY): 33 min  Problem List:  Patient Active Problem List   Diagnosis Date Noted  . TIA (transient ischemic attack) 03/24/2018  . Encephalopathy 03/24/2018  . Cellulitis 03/15/2018  . Hypernatremia 03/15/2018  . Persistent atrial fibrillation 11/10/2017  . Coronary artery disease involving native coronary artery of native heart without angina pectoris 11/10/2017  . Severe muscle deconditioning 11/10/2017  . Hemorrhage 10/22/2017  . CAD S/P percutaneous coronary angioplasty 10/22/2017  . Acute hypoxemic respiratory failure ([place]) 07/17/2017  . Aspiration syndrome, subsequent encounter 07/11/2017  . Aspiration pneumonia ([place]) 07/01/2017  . Acute encephalopathy 07/01/2017  . Thrombocytopenia ([place]) 07/01/2017  . Chronic diastolic (congestive) heart failure ([place]) 07/01/2017  . Anemia of chronic disease 07/01/2017  . Troponin level elevated 07/01/2017  . Chronic atrial fibrillation 07/01/2017  . Goals of care, counseling/discussion   . Palliative care by specialist   . Acute metabolic encephalopathy 56/81/2751  . CKD (chronic kidney disease) stage 3, GFR 30-59 ml/min (HCC)   . CAP (community acquired pneumonia) 04/17/2017  . Atrial fibrillation with RVR ([place]) 04/17/2017  . Hallux rigidus, right foot 03/10/2016  . Lumbosacral spondylosis without myelopathy 10/29/2015  . Memory difficulty 09/22/2015  . Abnormality of gait 09/22/2015  . Hyperglycemia 07/06/2015  . Chronic pain 07/06/2015  . Chronic insomnia 03/30/2015  . Transient alteration of awareness 03/30/2015  . Abnormal liver function   . Altered mental status 01/31/2015  . Essential hypertension 01/31/2015  . Constipation 01/31/2015  . Hypothyroidism 01/31/2015  . Seizure disorder ([place]) 01/31/2015  . Bladder outlet obstruction 01/31/2015  .  GERD (gastroesophageal reflux disease) 01/31/2015  . Chronic back pain 01/31/2015  . Acute kidney injury ([place]) 01/31/2015   Past Medical History:  Past Medical History:  Diagnosis Date  . Abnormality of gait 09/22/2015  . Arthritis   . Atrial fibrillation ([place])   . CAD (coronary artery disease)    Stent to RCA, Penta stent, 99% reduced to 0% 2002.  . Cancer ([place])    skin CA removed from back  . Chronic insomnia 03/30/2015  . Complication of anesthesia    trouble waking up  . Constipation    from medications taken  . GERD (gastroesophageal reflux disease)   . Hypercholesteremia   . Hypertension   . Hypothyroidism   . Memory difficulty 09/22/2015  . Osteoarthritis   . Seizures ([place])   . Sepsis ([place]) 05/2017  . Transient alteration of awareness 03/30/2015  . Vertigo    hx of   Past Surgical History:  Past Surgical History:  Procedure Laterality Date  . BACK SURGERY    . EYE SURGERY     Bilateral Cataract surgery   . HERNIA REPAIR    . I&D EXTREMITY Right 05/10/2015   Procedure: IRRIGATION AND DEBRIDEMENT EXTREMITY;  Surgeon: Leanora Cover, MD;  Location: [facility];  Service: Orthopedics;  Laterality: Right;  . KNEE ARTHROPLASTY     right knee X 2; left knee once  . LAMINECTOMY     X 6  . POSTERIOR CERVICAL FUSION/FORAMINOTOMY  01/28/2012   Procedure: POSTERIOR CERVICAL FUSION/FORAMINOTOMY LEVEL 3;  Surgeon: [name], MD;  Location: [facility];  Service: Neurosurgery;  Laterality: Left;  Posterior Cervical Five-Thoracic One Fusion, Arthrodesis with LEFT Cervical Seven-thoracic One Laminectomy, Foraminotomy and Resection of Synovial Cyst  . POSTERIOR CERVICAL FUSION/FORAMINOTOMY N/A 01/29/2013   Procedure: POSTERIOR  CERVICAL FUSION/FORAMINOTOMY LEVEL 1 and C2-5 Posteriolateral Arthrodesis;  Surgeon: [name], MD;  Location: [facility];  Service: Neurosurgery;  Laterality: N/A;  C2-C3 Laminectomy C2-C3 posterior cervical arthrodesis  .  TONSILLECTOMY     HPI:  Patient is a 85 y/o male who presents with AMS and worsening RLE infection. Admitted with encephalopathy and possible absence seizures. 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 PMH includes A-fib, CAD, CHF, gait abnormality, vertigo, seizure, cervical fusion, RLE wound, memory deficits.    Assessment / Plan / Recommendation Clinical Impression   [name] presents with moderate-severe cognitive communication deficits, characterized by slow processing, decreased sustained attention (fatigue/drowsiness contributes), impaired immediate and delayed recall, as well as deficits in problem solving and organization. Pt scored 13/30 on MOCA-Basic (26 and above is considered WNL). Auditory comprehension appears WFL, though pt has difficulty with multi-step instructions and following simple conversation due to attention, processing impairments. Pt with flat affect; speech is monotone but without dysarthria. SLP educated pt re: deficits and rehab process; encouraged pt to participate with therapies to maximize his rehabilitation. Skilled ST recommended to address cognitive deficits in order to improve ability to communicate with family and friends, follow commands for safety, and participate in his medical decision-making/ recovery.      SLP Assessment  SLP Recommendation/Assessment: Patient needs continued Speech Lanaguage Pathology Services SLP Visit Diagnosis: Cognitive communication deficit (R41.841)    Follow Up Recommendations  Skilled Nursing facility    Frequency and Duration min 1x/week  2 weeks      SLP Evaluation Cognition  Overall Cognitive Status: Impaired/Different from baseline Arousal/Alertness: Awake/alert Orientation Level: Oriented to place;Oriented to situation;Oriented to person Attention: Sustained Sustained Attention: Impaired Sustained Attention Impairment: Verbal basic(lethargy contributes) Memory: Impaired Memory Impairment:  Storage deficit;Decreased short term memory Decreased Short Term Memory: Verbal basic;Functional basic(pt could not recall details of pastor visit just 10 min prio) Awareness: Impaired Problem Solving: Impaired Problem Solving Impairment: Verbal basic(slow processign) Executive Function: Organizing;Reasoning Reasoning: Impaired Reasoning Impairment: Verbal complex Organizing: Impaired Organizing Impairment: Functional basic(clock drawing impaired) Behaviors: Other (comment)(flat affect)       Comprehension  Auditory Comprehension Overall Auditory Comprehension: Impaired Yes/No Questions: Within Functional Limits Commands: Impaired One Step Basic Commands: 75-100% accurate Multistep Basic Commands: 50-74% accurate(attention/processing vs auditory comprehension) Conversation: Simple Interfering Components: Other (comment)(drowsy) Visual Recognition/Discrimination Discrimination: Within Function Limits Reading Comprehension Reading Status: Not tested    Expression Expression Primary Mode of Expression: Verbal Verbal Expression Overall Verbal Expression: Impaired Automatic Speech: Name;Social Response Level of Generative/Spontaneous Verbalization: Conversation Repetition: No impairment Naming: Impairment Confrontation: (100%) Divergent: 0-24% accurate(attention) Pragmatics: Impairment Impairments: Abnormal affect;Eye contact;Monotone Interfering Components: Attention Effective Techniques: Open ended questions Non-Verbal Means of Communication: Not applicable Written Expression Dominant Hand: Right Written Expression: Not tested   Oral / Motor  Oral Motor/Sensory Function Overall Oral Motor/Sensory Function: Within functional limits Motor Speech Overall Motor Speech: Appears within functional limits for tasks assessed   [place], [place], [place] Pager: [phone] Office:  [phone]  Aliene Altes 03/25/2018, 4:48 PM

## 2018-03-25 NOTE — Progress Notes (Signed)
Valproic acid level for tomorrow AM ordered. EEG and MRI brain pending for today.   Electronically signed: Dr. Kerney Elbe

## 2018-03-25 NOTE — Progress Notes (Signed)
Pt refusing EEG even with tech, wife, and son trying to persuade him; called Dr Cheral Marker and informed him of patient refusal.

## 2018-03-25 NOTE — Consult Note (Signed)
Neurology Consultation  Reason for Consult: Altered mental status, concern for s breakthrough seizure versus stroke. Referring Physician: Dr. Nicolette Bang  CC: Altered mental status  History is obtained from: Chart, patient, patient's wife  HPI: [name] is a 85 y.o. male was a past medical history of atrial fibrillation not on anticoagulation, right lower extremity cellulitis, arthritis-on hydrocodone for pain, hypertension, cognitive impairment, history of seizures-primarily staring spells for which he is on treatment with Depakote, presented to the emergency room at [facility] on the afternoon of 03/24/2018 with complaints of altered mental status.  His wife said that at around breakfast he had an episode of staring that lasted anywhere from 5 to 15 minutes, which was concerning for the seizures that he has had since 2017.  Even after having broken the stair, he did not come around completely and this prompted her to call EMS and bring him to the hospital.  Another issue that she reported was that the right leg looked more swollen than it was before and they were planning to come to the ER anyway to get that checked out. He has been struggling with a nonhealing ulcer and cellulitis on the right lower extremity for which she is currently on antibiotics. His wife said that he has had staring spells which were deemed to be seizures and he was started initially on Keppra which he did not tolerate due to sedation side effects and it was then changed to valproate, which again had sedating effects at higher doses.  At the current dose that he was on at home which was 250 mg in the morning and 500 mg at night, he was doing well with his seizures and had not had a seizure/staring spell in a while. In the emergency room, his Depakote level was 37.  Case was discussed with the on-call neuro hospitalist at the time prior to his transfer to [facility].  Recommended a load of valproate 10  milligram per kilogram which he received IV. Telemedicine neurology consultation was obtained that recommended no TPA because symptoms are more consistent with encephalopathy and not stroke. Further imaging to assess for any evidence of LVO was recommended and a CT angiogram head and neck was completed-negative for LVO. Case was discussed with the neuro hospitalist who recommended the above valproate load and transferred to [facility] in case there was any requirement for continuous EEG or stroke work-up. Patient's wife says that he did not have any focal tingling numbness or weakness. He is on his hydrocodone and dose has not been changed for a while. Antibiotics for his right lower extremity cellulitis although have been changed around in the past few weeks and he has not had much relief with the cellulitis.  He is a patient of Dr. Jannifer Franklin at [facility] neurology but has not seen him for a few years.  He is looking for new recommendation for neurology outpatient follow-up.   LKW: 8:30 AM on 03/24/2018. tpa given?: no, outside the window for TPA Premorbid modified Rankin scale (mRS):4  ROS: Extensive review of systems was performed and is negative except for as noted in the HPI. Past Medical History:  Diagnosis Date  . Abnormality of gait 09/22/2015  . Arthritis   . Atrial fibrillation ([place])   . CAD (coronary artery disease)    Stent to RCA, Penta stent, 99% reduced to 0% 2002.  . Cancer ([place])    skin CA removed from back  . Chronic insomnia 03/30/2015  .  Complication of anesthesia    trouble waking up  . Constipation    from medications taken  . GERD (gastroesophageal reflux disease)   . Hypercholesteremia   . Hypertension   . Hypothyroidism   . Memory difficulty 09/22/2015  . Osteoarthritis   . Seizures ([place])   . Sepsis ([place]) 05/2017  . Transient alteration of awareness 03/30/2015  . Vertigo    hx of    Family History  Problem Relation Age of Onset  . Hypertension  Mother   . Cancer Mother   . Kidney failure Father   . Heart disease Father      Social History:   reports that he has quit smoking. He has never used smokeless tobacco. He reports current alcohol use. He reports that he does not use drugs. No current tobacco alcohol or illicit drug use.  He is a retired Software engineer.   Medications  Current Facility-Administered Medications:  .   stroke: mapping our early stages of recovery book, , Does not apply, Once, Spongberg, Audie Pinto, MD .  0.9 %  sodium chloride infusion, , Intravenous, Continuous, Spongberg, Audie Pinto, MD .  acetaminophen (TYLENOL) tablet 650 mg, 650 mg, Oral, Q4H PRN **OR** acetaminophen (TYLENOL) solution 650 mg, 650 mg, Per Tube, Q4H PRN **OR** acetaminophen (TYLENOL) suppository 650 mg, 650 mg, Rectal, Q4H PRN, Wyonia Hough, Audie Pinto, MD .  aspirin EC tablet 81 mg, 81 mg, Oral, Daily, Spongberg, Audie Pinto, MD .  ceFEPIme (MAXIPIME) 1 g in sodium chloride 0.9 % 100 mL IVPB, 1 g, Intravenous, Q12H, Spongberg, Audie Pinto, MD .  diltiazem (CARDIZEM CD) 24 hr capsule 300 mg, 300 mg, Oral, Daily, Spongberg, Audie Pinto, MD .  divalproex (DEPAKOTE) DR tablet 500 mg, 500 mg, Oral, Q12H, Spongberg, Audie Pinto, MD .  doxazosin (CARDURA) tablet 4 mg, 4 mg, Oral, QPM, Spongberg, Audie Pinto, MD .  ezetimibe (ZETIA) tablet 5 mg, 5 mg, Oral, QHS, Spongberg, Audie Pinto, MD .  furosemide (LASIX) tablet 40 mg, 40 mg, Oral, Daily, Spongberg, Audie Pinto, MD .  iopamidol (ISOVUE-370) 76 % injection, , , ,  .  levothyroxine (SYNTHROID, LEVOTHROID) tablet 150 mcg, 150 mcg, Oral, QAC breakfast, Spongberg, Audie Pinto, MD .  metoprolol tartrate (LOPRESSOR) tablet 25 mg, 25 mg, Oral, BID PC, Spongberg, Audie Pinto, MD .  pantoprazole (PROTONIX) EC tablet 40 mg, 40 mg, Oral, Daily, Spongberg, Audie Pinto, MD .  pravastatin (PRAVACHOL) tablet 40 mg, 40 mg, Oral, QPM, Spongberg, Audie Pinto, MD .  predniSONE  (DELTASONE) tablet 10 mg, 10 mg, Oral, Q breakfast, Spongberg, Audie Pinto, MD .  senna-docusate (Senokot-S) tablet 1 tablet, 1 tablet, Oral, QHS PRN, Wyonia Hough, Audie Pinto, MD .  sodium chloride (PF) 0.9 % injection, , , ,  .  vancomycin (VANCOCIN) IVPB 1000 mg/200 mL premix, 1,000 mg, Intravenous, Q24H, Thomes Lolling, RPH  Exam: Current vital signs: BP 139/78 (BP Location: Right Arm)   Pulse 83   Temp 97.7 F (36.5 C) (Oral)   Resp 16   Ht 5\' 9"  (1.753 m)   Wt 74.8 kg   SpO2 94%   BMI 24.37 kg/m  Vital signs in last 24 hours: Temp:  [96.8 F (36 C)-97.7 F (36.5 C)] 97.7 F (36.5 C) (01/13 2246) Pulse Rate:  [72-92] 83 (01/13 2246) Resp:  [10-21] 16 (01/13 2246) BP: (99-145)/(60-87) 139/78 (01/13 2246) SpO2:  [92 %-100 %] 94 % (01/13 2246) Weight:  [74.8 kg] 74.8 kg (01/13 1142) General: Awake alert in no distress HEENT:  Normocephalic atraumatic CVs: Irregularly irregular, S1-S2 heard Respiratory: Chest clear to auscultation Extremities: Right lower extremity cellulitis visible.  Foot in the bandage. Neurological exam Patient is awake, alert, oriented x3. His speech is not dysarthric.  Naming comprehension and repetition are intact although he has mildly reduced attention and concentration. Cranial nerves: Pupils are equal round react light, extraocular movements intact, visual fields full, face symmetric, facial sensation intact, auditory acuity intact, palate elevates in midline, shoulder shrug intact, tongue midline. Motor exam: Right upper extremity 5/5.  Left upper extremity exam limited by pain in the shoulder but distally 5/5.  Right lower extremity 3/5 at the hip-mainly due to pain and pre-existing premorbid right foot drop with 0 out of 5 plantar dorsiflexion the right lower extremity.  Left lower extremity nearly symmetric 5/5 antigravity without drift. Sensory exam: Intact light touch all over with no extinction Coordination: Intact  finger-nose-finger. Gait testing was deferred at this time. NIH stroke scale 1a Level of Conscious.: 0 1b LOC Questions: 0 1c LOC Commands: 0 2 Best Gaze: 0 3 Visual: 0 4 Facial Palsy: 0 5a Motor Arm - left: 0 5b Motor Arm - Right: 0 6a Motor Leg - Left: 0 6b Motor Leg - Right: 2 7 Limb Ataxia: 0 8 Sensory: 0 9 Best Language: 0 10 Dysarthria: 0 11 Extinct. and Inatten.: 0 TOTAL: 2  Labs I have reviewed labs in epic and the results pertinent to this consultation are:  CBC    Component Value Date/Time   WBC 8.6 03/24/2018 1026   RBC 2.95 (L) 03/24/2018 1026   HGB 9.6 (L) 03/24/2018 1026   HGB 12.6 (L) 11/22/2017 1130   HCT 31.7 (L) 03/24/2018 1026   HCT 37.1 (L) 11/22/2017 1130   PLT 238 03/24/2018 1026   PLT 154 11/22/2017 1130   MCV 107.5 (H) 03/24/2018 1026   MCV 93 11/22/2017 1130   MCH 32.5 03/24/2018 1026   MCHC 30.3 03/24/2018 1026   RDW 14.6 03/24/2018 1026   RDW 14.2 11/22/2017 1130   LYMPHSABS 1.0 03/24/2018 1026   LYMPHSABS 1.3 07/20/2015 0914   MONOABS 0.9 03/24/2018 1026   EOSABS 0.1 03/24/2018 1026   EOSABS 0.2 07/20/2015 0914   BASOSABS 0.1 03/24/2018 1026   BASOSABS 0.1 07/20/2015 0914    CMP     Component Value Date/Time   NA 141 03/24/2018 1026   NA 143 01/10/2018 1509   K 3.7 03/24/2018 1026   CL 106 03/24/2018 1026   CO2 24 03/24/2018 1026   GLUCOSE 163 (H) 03/24/2018 1026   BUN 47 (H) 03/24/2018 1026   BUN 47 (H) 01/10/2018 1509   CREATININE 1.39 (H) 03/24/2018 1026   CALCIUM 8.6 (L) 03/24/2018 1026   PROT 5.8 (L) 03/24/2018 1026   PROT 5.7 (L) 01/10/2018 1509   ALBUMIN 3.2 (L) 03/24/2018 1026   ALBUMIN 4.1 11/22/2017 1130   AST 43 (H) 03/24/2018 1026   ALT 56 (H) 03/24/2018 1026   ALKPHOS 70 03/24/2018 1026   BILITOT 0.6 03/24/2018 1026   BILITOT 0.5 11/22/2017 1130   GFRNONAA 47 (L) 03/24/2018 1026   GFRAA 54 (L) 03/24/2018 1026   Imaging I have reviewed the images obtained:  CT-scan of the brain and CTA head and neck  negative for any acute process.  No bleed.  Assessment:  85 year old man with past medical history of atrial fibrillation on anticoagulation, right lower extremity cellulitis, arthritis on pain medication/hydrocodone, hypertension, cognitive impairment, history of seizures that have been described as staring  spells-complex partial seizures for which she is on treatment with Depakote with the most recent Depakote level being subtherapeutic-evaluated initially at [facility] for sudden onset of unresponsiveness. Telemedicine neurology consultation obtained.  No focal/lateralizing signs on exam. Concern for prolonged postictal state versus stroke. CTA head and neck and CT head unremarkable for any acute process. On my examination, patient is level of consciousness and orientation were normal and his neurological exam appeared baseline in comparison to what has been documented in the past. Differentials include toxic metabolic encephalopathy in the setting of right lower extremity cellulitis versus seizure with prolonged postictal state. Stroke is low on the differentials but further imaging will be required to evaluate that further.  Impression: Toxic metabolic encephalopathy-right lower extremity cellulitis versus medication side effect as he is on sedating medications such as hydrocodone Evaluate for ongoing electrographic disturbance/seizure activity Evaluate for stroke  Recommendations: Continue Depakote 500 mg twice daily If he has sedating side effects from Depakote at this dose, consider changing it to Dilantin or carbamazepine or oxcarbazepine. Maintain seizure precautions Obtain MRI of the brain in the morning when feasible.  Might require some Ativan prior to the procedure. Routine EEG in the morning Outpatient follow-up with Dr. Delice Lesch at [facility] neurology after his discharge from the hospital. Do not initiate stroke work-up unless the MRI shows stroke.  I have taken the  liberty to discontinue the echocardiogram and the MRA of the head that was ordered as he is already had CTA head and neck and MRA is not required. Frequent neurochecks and PT/OT/speech therapy.  I have answered all the questions that the patient and his wife had at bedside.  Neurology service will follow up with you.     -- Amie Portland, MD [facility] Pager: [phone] If 7pm to 7am, please call on call as listed on AMION.

## 2018-03-25 NOTE — Progress Notes (Signed)
MRI completed, revealing a punctate acute infarction in the right cerebellum. Also seen is generalized atrophy and moderate chronic small-vessel ischemic changes of the cerebral hemispheric white matter.  The patient is adamantly refusing EEG, despite attempts by wife and son to convince him.   Assessment/Recommendations: 85 year old male with acute right cerebellar punctate ischemic infarction. Initially presented with AMS.  -- Will need to have completion of stroke work up with MRA head, carotid ultrasound and TTE.  -- Cardiac telemetry -- Continue ASA. Further modifications to regimen pending Stroke Team input.  -- Refusing EEG  Electronically signed: Dr. Kerney Elbe

## 2018-03-25 NOTE — Plan of Care (Signed)
  Problem: Education: Goal: Knowledge of General Education information will improve Description Including pain rating scale, medication(s)/side effects and non-pharmacologic comfort measures 03/25/2018 1709 by Jilda Roche, RN Outcome: Progressing 03/25/2018 1706 by Jilda Roche, RN Outcome: Progressing   Problem: Health Behavior/Discharge Planning: Goal: Ability to manage health-related needs will improve 03/25/2018 1709 by Jilda Roche, RN Outcome: Progressing 03/25/2018 1706 by Jilda Roche, RN Outcome: Progressing   Problem: Clinical Measurements: Goal: Ability to maintain clinical measurements within normal limits will improve 03/25/2018 1709 by Jilda Roche, RN Outcome: Progressing 03/25/2018 1706 by Jilda Roche, RN Outcome: Progressing Goal: Will remain free from infection 03/25/2018 1709 by Jilda Roche, RN Outcome: Progressing 03/25/2018 1706 by Jilda Roche, RN Outcome: Progressing Goal: Diagnostic test results will improve 03/25/2018 1709 by Jilda Roche, RN Outcome: Progressing 03/25/2018 1706 by Jilda Roche, RN Outcome: Progressing Goal: Respiratory complications will improve 03/25/2018 1709 by Jilda Roche, RN Outcome: Progressing 03/25/2018 1706 by Jilda Roche, RN Outcome: Progressing Goal: Cardiovascular complication will be avoided 03/25/2018 1709 by Jilda Roche, RN Outcome: Progressing 03/25/2018 1706 by Jilda Roche, RN Outcome: Progressing   Problem: Activity: Goal: Risk for activity intolerance will decrease 03/25/2018 1709 by Jilda Roche, RN Outcome: Progressing 03/25/2018 1706 by Jilda Roche, RN Outcome: Progressing   Problem: Nutrition: Goal: Adequate nutrition will be maintained 03/25/2018 1709 by Jilda Roche, RN Outcome: Progressing 03/25/2018 1706  by Jilda Roche, RN Outcome: Progressing   Problem: Coping: Goal: Level of anxiety will decrease 03/25/2018 1709 by Jilda Roche, RN Outcome: Progressing 03/25/2018 1706 by Jilda Roche, RN Outcome: Progressing   Problem: Elimination: Goal: Will not experience complications related to bowel motility 03/25/2018 1709 by Jilda Roche, RN Outcome: Progressing 03/25/2018 1706 by Jilda Roche, RN Outcome: Progressing Goal: Will not experience complications related to urinary retention 03/25/2018 1709 by Jilda Roche, RN Outcome: Progressing 03/25/2018 1706 by Jilda Roche, RN Outcome: Progressing   Problem: Pain Managment: Goal: General experience of comfort will improve 03/25/2018 1709 by Jilda Roche, RN Outcome: Progressing 03/25/2018 1706 by Jilda Roche, RN Outcome: Progressing   Problem: Safety: Goal: Ability to remain free from injury will improve 03/25/2018 1709 by Jilda Roche, RN Outcome: Progressing 03/25/2018 1706 by Jilda Roche, RN Outcome: Progressing   Problem: Skin Integrity: Goal: Risk for impaired skin integrity will decrease 03/25/2018 1709 by Jilda Roche, RN Outcome: Progressing 03/25/2018 1706 by Jilda Roche, RN Outcome: Progressing   Problem: Acute Treatment Outcomes Goal: Ability to function at adequate level Outcome: Progressing   Problem: Education: Goal: Knowledge of secondary prevention will improve Outcome: Progressing Goal: Knowledge of patient specific risk factors addressed and post discharge goals established will improve Outcome: Progressing

## 2018-03-25 NOTE — Plan of Care (Signed)
Problem: Education: Goal: Knowledge of General Education information will improve Description Including pain rating scale, medication(s)/side effects and non-pharmacologic comfort measures 03/25/2018 1830 by Jilda Roche, RN Outcome: Progressing 03/25/2018 1709 by Jilda Roche, RN Outcome: Progressing 03/25/2018 1706 by Jilda Roche, RN Outcome: Progressing   Problem: Health Behavior/Discharge Planning: Goal: Ability to manage health-related needs will improve 03/25/2018 1830 by Jilda Roche, RN Outcome: Progressing 03/25/2018 1709 by Jilda Roche, RN Outcome: Progressing 03/25/2018 1706 by Jilda Roche, RN Outcome: Progressing   Problem: Clinical Measurements: Goal: Ability to maintain clinical measurements within normal limits will improve 03/25/2018 1830 by Jilda Roche, RN Outcome: Progressing 03/25/2018 1709 by Jilda Roche, RN Outcome: Progressing 03/25/2018 1706 by Jilda Roche, RN Outcome: Progressing Goal: Will remain free from infection 03/25/2018 1830 by Jilda Roche, RN Outcome: Progressing 03/25/2018 1709 by Jilda Roche, RN Outcome: Progressing 03/25/2018 1706 by Jilda Roche, RN Outcome: Progressing Goal: Diagnostic test results will improve 03/25/2018 1830 by Jilda Roche, RN Outcome: Progressing 03/25/2018 1709 by Jilda Roche, RN Outcome: Progressing 03/25/2018 1706 by Jilda Roche, RN Outcome: Progressing Goal: Respiratory complications will improve 03/25/2018 1830 by Jilda Roche, RN Outcome: Progressing 03/25/2018 1709 by Jilda Roche, RN Outcome: Progressing 03/25/2018 1706 by Jilda Roche, RN Outcome: Progressing Goal: Cardiovascular complication will be avoided 03/25/2018 1830 by Jilda Roche, RN Outcome: Progressing 03/25/2018 1709 by  Jilda Roche, RN Outcome: Progressing 03/25/2018 1706 by Jilda Roche, RN Outcome: Progressing   Problem: Activity: Goal: Risk for activity intolerance will decrease 03/25/2018 1830 by Jilda Roche, RN Outcome: Progressing 03/25/2018 1709 by Jilda Roche, RN Outcome: Progressing 03/25/2018 1706 by Jilda Roche, RN Outcome: Progressing   Problem: Nutrition: Goal: Adequate nutrition will be maintained 03/25/2018 1830 by Jilda Roche, RN Outcome: Progressing 03/25/2018 1709 by Jilda Roche, RN Outcome: Progressing 03/25/2018 1706 by Jilda Roche, RN Outcome: Progressing   Problem: Coping: Goal: Level of anxiety will decrease 03/25/2018 1830 by Jilda Roche, RN Outcome: Progressing 03/25/2018 1709 by Jilda Roche, RN Outcome: Progressing 03/25/2018 1706 by Jilda Roche, RN Outcome: Progressing   Problem: Elimination: Goal: Will not experience complications related to bowel motility 03/25/2018 1830 by Jilda Roche, RN Outcome: Progressing 03/25/2018 1709 by Jilda Roche, RN Outcome: Progressing 03/25/2018 1706 by Jilda Roche, RN Outcome: Progressing Goal: Will not experience complications related to urinary retention 03/25/2018 1830 by Jilda Roche, RN Outcome: Progressing 03/25/2018 1709 by Jilda Roche, RN Outcome: Progressing 03/25/2018 1706 by Jilda Roche, RN Outcome: Progressing   Problem: Pain Managment: Goal: General experience of comfort will improve 03/25/2018 1830 by Jilda Roche, RN Outcome: Progressing 03/25/2018 1709 by Jilda Roche, RN Outcome: Progressing 03/25/2018 1706 by Jilda Roche, RN Outcome: Progressing   Problem: Safety: Goal: Ability to remain free from injury will improve 03/25/2018 1830 by Jilda Roche,  RN Outcome: Progressing 03/25/2018 1709 by Jilda Roche, RN Outcome: Progressing 03/25/2018 1706 by Jilda Roche, RN Outcome: Progressing   Problem: Skin Integrity: Goal: Risk for impaired skin integrity will decrease 03/25/2018 1830 by Jilda Roche, RN Outcome: Progressing 03/25/2018 1709 by Jilda Roche, RN Outcome: Progressing 03/25/2018 1706 by Jilda Roche, RN Outcome: Progressing   Problem: Acute Treatment Outcomes Goal: Ability to function at adequate level 03/25/2018 1830 by Jilda Roche, RN Outcome: Progressing 03/25/2018 1709 by Jilda Roche,  RN Outcome: Progressing   Problem: Education: Goal: Knowledge of secondary prevention will improve 03/25/2018 1830 by Jilda Roche, RN Outcome: Progressing 03/25/2018 1709 by Jilda Roche, RN Outcome: Progressing Goal: Knowledge of patient specific risk factors addressed and post discharge goals established will improve 03/25/2018 1830 by Jilda Roche, RN Outcome: Progressing 03/25/2018 1709 by Jilda Roche, RN Outcome: Progressing

## 2018-03-25 NOTE — Plan of Care (Signed)
Pt. Alert and oriented times 4, in no pain and stable.

## 2018-03-25 NOTE — Progress Notes (Signed)
Pt left floor for MRI, wife in the room sleeping at this time.  Pt alert and oriented x3, no complaints of pain or discomfort.  Bed in low position, call bell within reach.  Bed alarms on and functioning.  Assessment done and charted.  Will continue to monitor and do hourly rounding throughout the shift

## 2018-03-25 NOTE — Progress Notes (Signed)
PT Cancellation Note  Patient Details Name: [name] MRN: [id number] DOB: 1933/07/28   Cancelled Treatment:    Reason Eval/Treat Not Completed: Active bedrest order Will await increase in activity orders prior to PT evaluation.    Marguarite Arbour A Aleksandar Duve 03/25/2018, 7:19 AM Wray Kearns, PT, DPT Acute Rehabilitation Services Pager [phone] Office [phone]

## 2018-03-25 NOTE — Consult Note (Signed)
[place] Nurse wound consult note Patient receiving care in [facility] 3W05.  Spouse at bedside. 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  I saw the patient and the wounds on the right leg on 03/17/2018 at [facility].  See my note from 03/17/18 at 9:59.  At that time I instituted a hydrocolloid to the area to promote autolytic debridement.  Which it did.  Today the wound measures 3.3 cm x 4.3 cm x unknown depth due to soft, yellow and black necrotic tissue in the wound bed.  Also, the erythema and edema to the foot and leg is much improved compared to 03/17/18.  The surrounding tissue is dry and peeling.  There is no odor from this wound.  There is some serosanginous drainage on the existing gauze dressing. The wound just above the ankle today measures 2.3 cm x 1.4 cm x 0.7 cm.  The wound bed is pale pink and yellow.  There is no odor, no induration, no drainage from this wound. Pressure Injury POA: Yes Dressing procedure/placement/frequency: Cleanse each wound with normal saline. Place size appropriate pieces of AquaCel Ag+ into the wound beds.  Cover with dry gauze.  Secure in place with kerlex.  Change daily.  This wound care was performed today at the time of my visit. PLEASE CONSIDER AN ORTHOPEDIC CONSULTATION with Dr. [name] after the MRI of the right fibula if warranted. Monitor the wound area(s) for worsening of condition such as: Signs/symptoms of infection,  Increase in size,  Development of or worsening of odor, Development of pain, or increased pain at the affected locations.  Notify the medical team if any of these develop.  Thank you for the consult.  Discussed plan of care with the patient and bedside nurse.  [place] nurse will not follow at this time.  Please re-consult the [place] team if needed.  Val Riles, RN, MSN, CWOCN, CNS-BC, pager [phone]

## 2018-03-25 NOTE — Consult Note (Addendum)
   [facility] CM Inpatient Consult   03/25/2018  AURELIANO OSHIELDS 1933/11/13 [id number]    Mr. Bertoli recently signed up with [facility] program. Made aware of hospitalization by [facility]. Please note SNF was recommended last hospitalization and wife elected to take patient home instead.  Mr. Mimbs could benefit from goals of care/pallative consult due to multiple hospitalizations and multiple co-morbidities. Will text MD to request consideration.   Went to bedside to speak with Mr. Leavell and was on the phone. Wife was not present.   Spoke with inpatient RNCM and inpatient LCSW to make aware [facility] is active. Also discussed that Mr. Brumbach could benefit from goals of care discussion.  Will continue to follow for progression and disposition plans.   [name], MSN-Ed, RN,BSN [facility] Liaison [phone]

## 2018-03-25 NOTE — Patient Outreach (Signed)
[facility]) Care Management  03/25/2018  [name] [birth year] [id number]  Member visited ED yesterday and admitted to [facility] today due to Altered Mental Status. Hospital Liaisons made aware.  Will follow up with member when dischaged to community pending disposition needs.  Robert Mola "ANN" [name], RN-BSN  [facility] Care Management  Community Care Management Coordinator  [phone] [email or web address]

## 2018-03-25 NOTE — Evaluation (Signed)
Physical Therapy Evaluation Patient Details Name: [name] MRN: [id number] DOB: 1933/04/06 Today's Date: 03/25/2018   History of Present Illness  Patient is a 85 y/o male who presents with AMS and worsening RLE infection. Admitted with encephalopathy and possible absence seizures. 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 PMH includes A-fib, CAD, CHF, gait abnormality, vertigo, seizure, cervical fusion, RLE wound, memory deficits.   Clinical Impression  Patient presents with right sided weakness (not sure what is baseline), slow processing and decreased mobility s/p above. Bed mobility evaluation performed as pt with somber mood reporting he wants to give up and is tired of everything medically. Declines any OOB mobility. Spent good amount of session listening to wife/pt and consoling them as well as providing education on safety, importance of mobility etc. Recommend palliative care consult. Wife would like to take pt home however will not be able to help much physically. If wife not able to care for pt at home, will likely need SNF. Will follow acutely for further OOB assessment to determine appropriate follow up needs if pt willing.     Follow Up Recommendations SNF    Equipment Recommendations  None recommended by PT    Recommendations for Other Services       Precautions / Restrictions Precautions Precautions: Fall Restrictions Weight Bearing Restrictions: No      Mobility  Bed Mobility               General bed mobility comments: Pt declined all mobility today so bed eval performed.   Transfers                    Ambulation/Gait                Stairs            Wheelchair Mobility    Modified Rankin (Stroke Patients Only) Modified Rankin (Stroke Patients Only) Pre-Morbid Rankin Score: Moderately severe disability Modified Rankin: Moderately severe disability     Balance                                              Pertinent Vitals/Pain Pain Assessment: Faces Faces Pain Scale: Hurts little more Pain Location: R LE Pain Descriptors / Indicators: Discomfort;Sore Pain Intervention(s): Monitored during session;Limited activity within patient's tolerance;Repositioned    Home Living Family/patient expects to be discharged to:: Unsure Living Arrangements: Spouse/significant other Available Help at Discharge: Family;Available 24 hours/day Type of Home: House Home Access: Ramped entrance     Home Layout: One level Home Equipment: Walker - 4 wheels;Cane - single point;Bedside commode;Shower seat;Grab bars - tub/shower;Wheelchair - manual;Transport chair;Hospital bed      Prior Function Level of Independence: Needs assistance   Gait / Transfers Assistance Needed: Per wife, since last hospitalization pt has been transferring to w/c and not doing much walking. If he does it is short distances in the house with w/c follow and gait belt donned.  ADL's / Homemaking Assistance Needed: assistance for bathing, dressing  Comments: Has had several admissions recently.     Hand Dominance   Dominant Hand: Right    Extremity/Trunk Assessment   Upper Extremity Assessment Upper Extremity Assessment: Defer to OT evaluation    Lower Extremity Assessment Lower Extremity Assessment: RLE deficits/detail;LLE deficits/detail RLE Deficits / Details: Ankle wrapped due to wound; swelling present in dorsum of  right foot. Able to perform quad set and knee flexion but not able to lift off bed against gravity. RLE Sensation: decreased light touch LLE Deficits / Details: Able to lift LLE off bed against gravity without difficulty.        Communication   Communication: No difficulties  Cognition Arousal/Alertness: Awake/alert Behavior During Therapy: WFL for tasks assessed/performed Overall Cognitive Status: Impaired/Different from baseline Area of Impairment: Orientation;Awareness                  Orientation Level: Disoriented to;Situation             General Comments: Difficulty coming up with birth year, A&Ox3, but not able to state situation. Very somber today- reports being depressed about being in the hospital and wants to "just go"       General Comments General comments (skin integrity, edema, etc.): Wife present. Spent a good amount of time listening and comforting pt and wife. Pt expresses wanting to give up and not interested in therapy or getting up today or ever. "just send me to the nursing home and let me be." Wife is on different page so recommend palliative care consult.     Exercises     Assessment/Plan    PT Assessment Patient needs continued PT services  PT Problem List Decreased strength;Decreased range of motion;Decreased balance;Decreased mobility;Pain;Decreased activity tolerance;Decreased knowledge of use of DME;Impaired sensation;Decreased skin integrity;Decreased cognition       PT Treatment Interventions DME instruction;Therapeutic activities;Gait training;Functional mobility training;Balance training;Patient/family education;Therapeutic exercise;Wheelchair mobility training    PT Goals (Current goals can be found in the Care Plan section)  Acute Rehab PT Goals Patient Stated Goal: "just let me go." PT Goal Formulation: With patient Time For Goal Achievement: 04/08/18 Potential to Achieve Goals: Fair    Frequency Min 2X/week   Barriers to discharge        Co-evaluation               AM-PAC PT "6 Clicks" Mobility  Outcome Measure Help needed turning from your back to your side while in a flat bed without using bedrails?: A Little Help needed moving from lying on your back to sitting on the side of a flat bed without using bedrails?: A Lot Help needed moving to and from a bed to a chair (including a wheelchair)?: A Lot Help needed standing up from a chair using your arms (e.g., wheelchair or bedside chair)?: A Lot Help  needed to walk in hospital room?: A Lot Help needed climbing 3-5 steps with a railing? : Total 6 Click Score: 12    End of Session   Activity Tolerance: Other (comment)(not wanting to get OOB) Patient left: in bed;with call bell/phone within reach;with bed alarm set;with family/visitor present Nurse Communication: Mobility status PT Visit Diagnosis: Muscle weakness (generalized) (M62.81);Pain Pain - Right/Left: Right Pain - part of body: Leg    Time: 1030-1057 PT Time Calculation (min) (ACUTE ONLY): 27 min   Charges:   PT Evaluation $PT Eval Moderate Complexity: 1 Mod          Wray Kearns, PT, DPT Acute Rehabilitation Services Pager [phone] Office [place] 03/25/2018, 11:22 AM

## 2018-03-25 NOTE — Progress Notes (Addendum)
PROGRESS NOTE                                                                                                                                                                                                             Patient Demographics:    [name], is a 85 y.o. male, DOB - 12-09-1933, VZC:[id number]  Admit date - 03/24/2018   Admitting Physician Marcell Anger, MD  Outpatient Primary MD for the patient is [name], MD  LOS - 0  Outpatient Specialists:  Chief Complaint  Patient presents with  . Wound Check  . Altered Mental Status       Brief Narrative   85 year old male with history of stroke, A. fib off anticoagulation for past 6-7 months due to lower extremity hematoma secondary to fall, seizure disorder, chronic kidney disease stage III, chronic diastolic CHF, OSA, CAD , was hospitalized from 1/4-1/8 with sepsis secondary to cellulitis of the right leg.  He has chronic right leg wound for which he was followed at the wound care center.  Patient was treated with IV vancomycin and Zosyn and Flagyl and discharged on Keflex + doxycycline. For past 2 days patient was increasingly confused and somnolent per his wife. Brought to the ED where tele-neurology was concern for subclinical seizures versus stroke.  Vitals were stable while Depakote level was subtherapeutic.  Given a loading dose of Depakote and transferred to [facility] for further work-up by neurology. CT head, CT angiogram head and neck negative for acute findings.  MRI of the brain showed punctate acute infarct in the right cerebellum.    Subjective:   Patient still has some confusion.  Denies any pain or weakness.   Assessment  & Plan :    Principal Problem: Acute metabolic encephalopathy/acute ischemic stroke MRI brain showing punctate acute infarct in the right cerebellum. Continue statin.  Switch to full dose aspirin..  Check 2D  echo, carotid Doppler, MRA head.  Further recommendations per stroke team. EEG recommended by neurology but patient refused. Continue neurochecks.  Reportedly has mild memory impairment.  SLP evaluation, PT/OT eval.  Active Problems:   Essential hypertension Allow permissive blood pressure.  Persistent right leg cellulitis with tibial ulcer No signs of sepsis clinically.  Wife reports that the ulceration in the lateral tibia has worsened since his hospital discharge.  I  suspect this has progressed since recent discharge as wound care consult was not asked to evaluate during his recent hospitalization. Continue vancomycin and cefepime.  Will obtain MRI of the tibia to rule out osteomyelitis.  Wound care consulted.   NSVT Noted for 10 runs of NSVT on the monitor this morning.  Potassium normal.  MG 1.8, will replenish.     Seizure disorder (HCC) Subtherapeutic valproate level, received loading dose in the ED and continued valproate.  Refused EEG.    GERD (gastroesophageal reflux disease) Continue PPI     CKD (chronic kidney disease) stage 3, GFR 30-59 ml/min (HCC) Renal function appears at baseline.  Monitor for now    Chronic diastolic (congestive) heart failure ([place]) Euvolemic.  Monitor on gentle hydration    Chronic atrial fibrillation Rate controlled.  Not on anticoagulation due to leg hematoma in the past.   Coronary artery disease involving native coronary artery of native heart without angina pectoris  Nonhealing right leg ulcer Was seen by ?  Dr. Trula Slade in the past.  Wife reports patient has an appointment later this month.  If MRI of the leg negative for osteomyelitis vascular surgery needs to be consulted.  Hypothyroidism Continue Synthroid  Code Status : full code  Family Communication  : wife at bedside  Disposition Plan  : pending clinical course  Barriers For Discharge : active symptoms  Consults  :  neurology  Procedures  : ct head, CT angio head and  neck, MRI brain, pending MRA head,echo, MRI rt tibia  DVT Prophylaxis  :  Lovenox -   Lab Results  Component Value Date   PLT 238 03/24/2018    Antibiotics  :    Anti-infectives (From admission, onward)   Start     Dose/Rate Route Frequency Ordered Stop   03/24/18 2200  ceFEPIme (MAXIPIME) 1 g in sodium chloride 0.9 % 100 mL IVPB     1 g 200 mL/hr over 30 Minutes Intravenous Every 12 hours 03/24/18 1921     03/24/18 2200  vancomycin (VANCOCIN) IVPB 1000 mg/200 mL premix     1,000 mg 200 mL/hr over 60 Minutes Intravenous Every 24 hours 03/24/18 1929     03/24/18 1845  vancomycin (VANCOCIN) IVPB 1000 mg/200 mL premix  Status:  Discontinued     1,000 mg 200 mL/hr over 60 Minutes Intravenous Every 12 hours 03/24/18 1832 03/24/18 1928   03/24/18 1045  ceFEPIme (MAXIPIME) 2 g in sodium chloride 0.9 % 100 mL IVPB     2 g 200 mL/hr over 30 Minutes Intravenous  Once 03/24/18 1038 03/24/18 1129   03/24/18 1045  metroNIDAZOLE (FLAGYL) IVPB 500 mg  Status:  Discontinued     500 mg 100 mL/hr over 60 Minutes Intravenous Every 8 hours 03/24/18 1038 03/24/18 1954   03/24/18 1045  vancomycin (VANCOCIN) IVPB 1000 mg/200 mL premix     1,000 mg 200 mL/hr over 60 Minutes Intravenous  Once 03/24/18 1038 03/24/18 1728        Objective:   Vitals:   03/25/18 0541 03/25/18 0542 03/25/18 0835 03/25/18 1240  BP:   139/90 132/76  Pulse: 96 90 93 98  Resp: 18 13 14 18   Temp:   97.6 F (36.4 C) 98.1 F (36.7 C)  TempSrc:   Oral Oral  SpO2: 91% 95% 96% 98%  Weight:      Height:        Wt Readings from Last 3 Encounters:  03/24/18 74.8 kg  03/15/18 75  kg  01/10/18 75 kg     Intake/Output Summary (Last 24 hours) at 03/25/2018 1521 Last data filed at 03/25/2018 0654 Gross per 24 hour  Intake 990.79 ml  Output 1050 ml  Net -59.21 ml     Physical Exam  Gen: not in distress, fatigued HEENT: no pallor, moist mucosa, supple neck Chest: clear b/l, no added sounds CVS: Normal S1-S2, no  murmurs rub or gallop GI: soft, NT, ND, BS+ Musculoskeletal: warm, erythema with swelling over the right lower leg extending almost up to the knee with deep ulceration measuring 3 into 2 cm, another 3 cm ulceration over the lateral malleolus without discharge or bleeding.  Distal pulses palpable CNS: Alert and oriented x2, no tremors    Data Review:    CBC Recent Labs  Lab 03/19/18 0543 03/24/18 1026  WBC 7.0 8.6  HGB 9.0* 9.6*  HCT 29.2* 31.7*  PLT 169 238  MCV 103.5* 107.5*  MCH 31.9 32.5  MCHC 30.8 30.3  RDW 14.5 14.6  LYMPHSABS 0.7 1.0  MONOABS 1.2* 0.9  EOSABS 0.1 0.1  BASOSABS 0.0 0.1    Chemistries  Recent Labs  Lab 03/19/18 0543 03/24/18 1026 03/25/18 1247  NA 139 141 140  K 4.0 3.7 3.9  CL 109 106 105  CO2 22 24 25   GLUCOSE 83 163* 107*  BUN 39* 47* 26*  CREATININE 1.24 1.39* 1.14  CALCIUM 7.8* 8.6* 8.4*  MG  --   --  1.8  AST  --  43*  --   ALT  --  56*  --   ALKPHOS  --  70  --   BILITOT  --  0.6  --    ------------------------------------------------------------------------------------------------------------------ No results for input(s): CHOL, HDL, LDLCALC, TRIG, CHOLHDL, LDLDIRECT in the last 72 hours.  Lab Results  Component Value Date   HGBA1C 5.0 07/12/2017   ------------------------------------------------------------------------------------------------------------------ No results for input(s): TSH, T4TOTAL, T3FREE, THYROIDAB in the last 72 hours.  Invalid input(s): FREET3 ------------------------------------------------------------------------------------------------------------------ No results for input(s): VITAMINB12, FOLATE, FERRITIN, TIBC, IRON, RETICCTPCT in the last 72 hours.  Coagulation profile Recent Labs  Lab 03/24/18 1026  INR 1.16    No results for input(s): DDIMER in the last 72 hours.  Cardiac Enzymes No results for input(s): CKMB, TROPONINI, MYOGLOBIN in the last 168 hours.  Invalid input(s):  CK ------------------------------------------------------------------------------------------------------------------    Component Value Date/Time   BNP 309.9 (H) 07/11/2017 0346    Inpatient Medications  Scheduled Meds: .  stroke: mapping our early stages of recovery book   Does not apply Once  . aspirin EC  81 mg Oral Daily  . diltiazem  300 mg Oral Daily  . divalproex  500 mg Oral Q12H  . doxazosin  4 mg Oral QPM  . ezetimibe  5 mg Oral QHS  . furosemide  40 mg Oral Daily  . levothyroxine  150 mcg Oral QAC breakfast  . metoprolol tartrate  25 mg Oral BID PC  . pantoprazole  40 mg Oral Daily  . pravastatin  40 mg Oral QPM  . predniSONE  10 mg Oral Q breakfast   Continuous Infusions: . sodium chloride 75 mL/hr at 03/25/18 0859  . ceFEPime (MAXIPIME) IV Stopped (03/25/18 1459)  . vancomycin 1,000 mg (03/25/18 0959)   PRN Meds:.acetaminophen **OR** acetaminophen (TYLENOL) oral liquid 160 mg/5 mL **OR** acetaminophen, senna-docusate  Micro Results Recent Results (from the past 240 hour(s))  Culture, blood (Routine x 2)     Status: None (Preliminary result)  Collection Time: 03/24/18 10:27 AM  Result Value Ref Range Status   Specimen Description   Final    BLOOD LEFT ANTECUBITAL Performed at [facility] [address], [place], [place] [address]    Special Requests   Final    BOTTLES DRAWN AEROBIC AND ANAEROBIC Blood Culture adequate volume Performed at [facility] [address], [place], [place] [address]    Culture   Final    NO GROWTH 1 DAY Performed at [facility], [place] [address] [place], [place] [address]    Report Status PENDING  Incomplete  Culture, blood (Routine x 2)     Status: None (Preliminary result)   Collection Time: 03/24/18 10:28 AM  Result Value Ref Range Status   Specimen Description   Final    BLOOD RIGHT HAND Performed at [facility] [address], [place], [place]  [address]    Special Requests   Final    BOTTLES DRAWN AEROBIC AND ANAEROBIC Blood Culture results may not be optimal due to an excessive volume of blood received in culture bottles Performed at [facility] [address], [place], [place] [address]    Culture   Final    NO GROWTH 1 DAY Performed at [facility], [place] [address], [place], [place] [address]    Report Status PENDING  Incomplete    Radiology Reports Ct Angio Head W Or Wo Contrast  Result Date: 03/24/2018 CLINICAL DATA:  History of atrial fibrillation with altered mental status. EXAM: CT ANGIOGRAPHY HEAD AND NECK TECHNIQUE: Multidetector CT imaging of the head and neck was performed using the standard protocol during bolus administration of intravenous contrast. Multiplanar CT image reconstructions and MIPs were obtained to evaluate the vascular anatomy. Carotid stenosis measurements (when applicable) are obtained utilizing NASCET criteria, using the distal internal carotid diameter as the denominator. CONTRAST:  154mL ISOVUE-370 IOPAMIDOL (ISOVUE-370) INJECTION 76% COMPARISON:  Head CT 07/01/2017 FINDINGS: CTA NECK FINDINGS SKELETON: Long segment cervical spinal fusion, anteriorly from C3 to C6 and posteriorly from C2-T1. OTHER NECK: Normal pharynx, larynx and major salivary glands. No cervical lymphadenopathy. Unremarkable thyroid gland. UPPER CHEST: No pneumothorax or pleural effusion. No nodules or masses. AORTIC ARCH: There is no calcific atherosclerosis of the aortic arch. There is no aneurysm, dissection or hemodynamically significant stenosis of the visualized ascending aorta and aortic arch. Conventional 3 vessel aortic branching pattern. The visualized proximal subclavian arteries are widely patent. RIGHT CAROTID SYSTEM: --Common carotid artery: Widely patent origin without common carotid artery dissection or aneurysm. --Internal carotid artery: No dissection, occlusion or aneurysm. Mild atherosclerotic  calcification at the carotid bifurcation without hemodynamically significant stenosis. --External carotid artery: No acute abnormality. LEFT CAROTID SYSTEM: --Common carotid artery: Widely patent origin without common carotid artery dissection or aneurysm. --Internal carotid artery: No dissection, occlusion or aneurysm. Mild atherosclerotic calcification at the carotid bifurcation without hemodynamically significant stenosis. --External carotid artery: No acute abnormality. VERTEBRAL ARTERIES: Codominant configuration. There is atherosclerotic calcification of both vertebral artery origins. There is atherosclerotic calcification of both V4 segments. The V2 and V3 segments are normal. CTA HEAD FINDINGS POSTERIOR CIRCULATION: --Basilar artery: Normal. --Posterior cerebral arteries: Both are predominantly supplied by the posterior communicating arteries. --Superior cerebellar arteries: Normal. --Inferior cerebellar arteries: Normal anterior and posterior inferior cerebellar arteries. ANTERIOR CIRCULATION: --Intracranial internal carotid arteries: Atherosclerotic calcification of the internal carotid arteries at the skull base without hemodynamically significant stenosis. --Anterior cerebral arteries: Normal. Both A1 segments  are present. Patent anterior communicating artery. --Middle cerebral arteries: Normal. --Posterior communicating arteries: Present bilaterally. VENOUS SINUSES: As permitted by contrast timing, patent. ANATOMIC VARIANTS: None DELAYED PHASE: No parenchymal contrast enhancement. Review of the MIP images confirms the above findings. IMPRESSION: No emergent large vessel occlusion or hemodynamically significant stenosis of the arteries of the head and neck. Electronically Signed   By: Ulyses Jarred M.D.   On: 03/24/2018 18:39   Dg Chest 2 View  Result Date: 03/24/2018 CLINICAL DATA:  Sepsis, weakness. EXAM: CHEST - 2 VIEW COMPARISON:  Radiograph March 13, 2018. FINDINGS: Stable cardiomegaly. No  pneumothorax is noted. Right lung is clear. Stable elevated left hemidiaphragm is noted with minimal left basilar subsegmental atelectasis. Small left pleural effusion is noted. Bony thorax is unremarkable. IMPRESSION: Stable elevated left hemidiaphragm with minimal left basilar atelectasis. Small left pleural effusion is noted. Electronically Signed   By: Marijo Conception, M.D.   On: 03/24/2018 10:44   Dg Chest 2 View  Result Date: 03/13/2018 CLINICAL DATA:  Sepsis. EXAM: CHEST - 2 VIEW COMPARISON:  Radiograph Jul 15, 2017. FINDINGS: Stable cardiomediastinal silhouette. Stable elevated left hemidiaphragm is noted with minimal left basilar subsegmental atelectasis. No pneumothorax or significant pleural effusion is noted. Right lung is clear. Bony thorax is unremarkable. IMPRESSION: Stable elevated left hemidiaphragm with minimal left basilar subsegmental atelectasis. Electronically Signed   By: Marijo Conception, M.D.   On: 03/13/2018 11:29   Dg Ankle Complete Right  Result Date: 03/13/2018 CLINICAL DATA:  Right ankle wound. EXAM: RIGHT ANKLE - COMPLETE 3+ VIEW COMPARISON:  Radiographs of May 23, 2017. FINDINGS: There is no evidence of fracture, dislocation, or joint effusion. There is no evidence of arthropathy or other focal bone abnormality. Vascular calcifications are noted. Probable soft tissue ulceration is seen overlying distal right fibula. No lytic destruction is seen to suggest osteomyelitis. IMPRESSION: Probable soft tissue ulceration seen overlying distal right fibula. No significant bony abnormality is noted. Electronically Signed   By: Marijo Conception, M.D.   On: 03/13/2018 11:31   Ct Head Wo Contrast  Result Date: 03/24/2018 CLINICAL DATA:  Altered level of consciousness EXAM: CT HEAD WITHOUT CONTRAST TECHNIQUE: Contiguous axial images were obtained from the base of the skull through the vertex without intravenous contrast. COMPARISON:  04/17/2017 FINDINGS: Brain: No evidence of acute  infarction, hemorrhage, extra-axial collection, ventriculomegaly, or mass effect. Generalized cerebral atrophy. Periventricular white matter low attenuation likely secondary to microangiopathy. Vascular: Cerebrovascular atherosclerotic calcifications are noted. Skull: Negative for fracture or focal lesion. Retro-dental pannus formation. Sinuses/Orbits: Visualized portions of the orbits are unremarkable. Visualized portions of the paranasal sinuses and mastoid air cells are unremarkable. Other: None. IMPRESSION: 1. No acute intracranial pathology. 2. Chronic microvascular disease and cerebral atrophy. Electronically Signed   By: Kathreen Devoid   On: 03/24/2018 13:19   Ct Angio Neck W Or Wo Contrast  Result Date: 03/24/2018 CLINICAL DATA:  History of atrial fibrillation with altered mental status. EXAM: CT ANGIOGRAPHY HEAD AND NECK TECHNIQUE: Multidetector CT imaging of the head and neck was performed using the standard protocol during bolus administration of intravenous contrast. Multiplanar CT image reconstructions and MIPs were obtained to evaluate the vascular anatomy. Carotid stenosis measurements (when applicable) are obtained utilizing NASCET criteria, using the distal internal carotid diameter as the denominator. CONTRAST:  13mL ISOVUE-370 IOPAMIDOL (ISOVUE-370) INJECTION 76% COMPARISON:  Head CT 07/01/2017 FINDINGS: CTA NECK FINDINGS SKELETON: Long segment cervical spinal fusion, anteriorly from C3 to C6 and posteriorly from  C2-T1. OTHER NECK: Normal pharynx, larynx and major salivary glands. No cervical lymphadenopathy. Unremarkable thyroid gland. UPPER CHEST: No pneumothorax or pleural effusion. No nodules or masses. AORTIC ARCH: There is no calcific atherosclerosis of the aortic arch. There is no aneurysm, dissection or hemodynamically significant stenosis of the visualized ascending aorta and aortic arch. Conventional 3 vessel aortic branching pattern. The visualized proximal subclavian arteries are  widely patent. RIGHT CAROTID SYSTEM: --Common carotid artery: Widely patent origin without common carotid artery dissection or aneurysm. --Internal carotid artery: No dissection, occlusion or aneurysm. Mild atherosclerotic calcification at the carotid bifurcation without hemodynamically significant stenosis. --External carotid artery: No acute abnormality. LEFT CAROTID SYSTEM: --Common carotid artery: Widely patent origin without common carotid artery dissection or aneurysm. --Internal carotid artery: No dissection, occlusion or aneurysm. Mild atherosclerotic calcification at the carotid bifurcation without hemodynamically significant stenosis. --External carotid artery: No acute abnormality. VERTEBRAL ARTERIES: Codominant configuration. There is atherosclerotic calcification of both vertebral artery origins. There is atherosclerotic calcification of both V4 segments. The V2 and V3 segments are normal. CTA HEAD FINDINGS POSTERIOR CIRCULATION: --Basilar artery: Normal. --Posterior cerebral arteries: Both are predominantly supplied by the posterior communicating arteries. --Superior cerebellar arteries: Normal. --Inferior cerebellar arteries: Normal anterior and posterior inferior cerebellar arteries. ANTERIOR CIRCULATION: --Intracranial internal carotid arteries: Atherosclerotic calcification of the internal carotid arteries at the skull base without hemodynamically significant stenosis. --Anterior cerebral arteries: Normal. Both A1 segments are present. Patent anterior communicating artery. --Middle cerebral arteries: Normal. --Posterior communicating arteries: Present bilaterally. VENOUS SINUSES: As permitted by contrast timing, patent. ANATOMIC VARIANTS: None DELAYED PHASE: No parenchymal contrast enhancement. Review of the MIP images confirms the above findings. IMPRESSION: No emergent large vessel occlusion or hemodynamically significant stenosis of the arteries of the head and neck. Electronically Signed   By:  Ulyses Jarred M.D.   On: 03/24/2018 18:39   Mr Brain Wo Contrast  Result Date: 03/25/2018 CLINICAL DATA:  Atrial fibrillation. Cognitive impairment. History of seizures. Acute presentation with altered mental status. EXAM: MRI HEAD WITHOUT CONTRAST TECHNIQUE: Multiplanar, multiecho pulse sequences of the brain and surrounding structures were obtained without intravenous contrast. COMPARISON:  CT studies 03/24/2018.  MRI 02/01/2015. FINDINGS: Brain: Punctate acute infarction in the right cerebellum. No other acute infarction or cause of restricted diffusion. The brainstem is normal. No other cerebellar finding. Cerebral hemispheres show age related atrophy with moderate chronic small-vessel ischemic changes of the deep white matter. No large vessel territory infarction. No mass lesion, hemorrhage, hydrocephalus or extra-axial collection. No mesial temporal lesion is seen. Vascular: Major vessels at the base of the brain show flow. Skull and upper cervical spine: Negative Sinuses/Orbits: Clear/normal Other: None IMPRESSION: Punctate acute infarction in the right cerebellum. Its not absolutely clear how this would relate to the clinical presentation. No second acute lesion. Generalized atrophy. Moderate chronic small-vessel ischemic changes of the cerebral hemispheric white matter. Electronically Signed   By: Nelson Chimes M.D.   On: 03/25/2018 08:24    Time Spent in minutes  35   Vikrant Pryce M.D on 03/25/2018 at 3:21 PM  Between 7am to 7pm - Pager - [phone]  After 7pm go to [email or web address] - password [facility]  [facility] -  Office  [phone]

## 2018-03-25 NOTE — Progress Notes (Signed)
Sent Dr Clementeen Graham, N.  Message that the pt ran a 10 beat run of VT per central tele

## 2018-03-25 NOTE — Progress Notes (Signed)
OT Cancellation Note  Patient Details Name: [name] MRN: [id number] DOB: 09-Feb-1934   Cancelled Treatment:     Pt still no bedrest at this time.  Per discussion with PT who went in and talked to the pt and family.  He does not want to get up right now at this time and is frustrated with coming back into the hospital.  Will re-attempt to see tomorrow for OT eval.    Hilarie Sinha  OTR/L  03/25/2018, 11:04 AM

## 2018-03-25 NOTE — Plan of Care (Signed)

## 2018-03-25 NOTE — Progress Notes (Signed)
*  PRELIMINARY RESULTS* Echocardiogram 2D Echocardiogram has been performed.  [name] 03/25/2018, 1:56 PM

## 2018-03-26 ENCOUNTER — Inpatient Hospital Stay (HOSPITAL_COMMUNITY): Payer: Medicare Other

## 2018-03-26 ENCOUNTER — Ambulatory Visit: Payer: Medicare Other | Admitting: Cardiology

## 2018-03-26 DIAGNOSIS — I82409 Acute embolism and thrombosis of unspecified deep veins of unspecified lower extremity: Secondary | ICD-10-CM

## 2018-03-26 DIAGNOSIS — G9340 Encephalopathy, unspecified: Secondary | ICD-10-CM

## 2018-03-26 DIAGNOSIS — I63341 Cerebral infarction due to thrombosis of right cerebellar artery: Secondary | ICD-10-CM

## 2018-03-26 DIAGNOSIS — R569 Unspecified convulsions: Secondary | ICD-10-CM

## 2018-03-26 LAB — LIPID PANEL
Cholesterol: 88 mg/dL (ref 0–200)
HDL: 38 mg/dL — ABNORMAL LOW (ref >40)
LDL Cholesterol: 35 mg/dL (ref 0–99)
Total CHOL/HDL Ratio: 2.3 RATIO
Triglycerides: 75 mg/dL (ref <150)
VLDL: 15 mg/dL (ref 0–40)

## 2018-03-26 LAB — CBC WITH DIFFERENTIAL/PLATELET
Abs Immature Granulocytes: 0.3 10*3/uL — ABNORMAL HIGH (ref 0.00–0.07)
Basophils Absolute: 0 10*3/uL (ref 0.0–0.1)
Basophils Relative: 0 %
Eosinophils Absolute: 0 10*3/uL (ref 0.0–0.5)
Eosinophils Relative: 0 %
HCT: 30.3 % — ABNORMAL LOW (ref 39.0–52.0)
Hemoglobin: 9.5 g/dL — ABNORMAL LOW (ref 13.0–17.0)
Lymphocytes Relative: 16 %
Lymphs Abs: 1.4 10*3/uL (ref 0.7–4.0)
MCH: 32.1 pg (ref 26.0–34.0)
MCHC: 31.4 g/dL (ref 30.0–36.0)
MCV: 102.4 fL — ABNORMAL HIGH (ref 80.0–100.0)
Monocytes Absolute: 0.4 10*3/uL (ref 0.1–1.0)
Monocytes Relative: 5 %
Myelocytes: 3 %
Neutro Abs: 6.8 10*3/uL (ref 1.7–7.7)
Neutrophils Relative %: 76 %
Platelets: 206 10*3/uL (ref 150–400)
RBC: 2.96 MIL/uL — ABNORMAL LOW (ref 4.22–5.81)
RDW: 14.6 % (ref 11.5–15.5)
WBC: 8.9 10*3/uL (ref 4.0–10.5)
nRBC: 0 % (ref 0.0–0.2)
nRBC: 0 /100 WBC

## 2018-03-26 LAB — URINALYSIS, ROUTINE W REFLEX MICROSCOPIC
Bilirubin Urine: NEGATIVE
Glucose, UA: NEGATIVE mg/dL
Hgb urine dipstick: NEGATIVE
Ketones, ur: NEGATIVE mg/dL
Leukocytes, UA: NEGATIVE
Nitrite: NEGATIVE
Protein, ur: NEGATIVE mg/dL
Specific Gravity, Urine: 1.006 (ref 1.005–1.030)
pH: 6 (ref 5.0–8.0)

## 2018-03-26 LAB — BASIC METABOLIC PANEL
Anion gap: 8 (ref 5–15)
BUN: 21 mg/dL (ref 8–23)
CO2: 25 mmol/L (ref 22–32)
Calcium: 8.1 mg/dL — ABNORMAL LOW (ref 8.9–10.3)
Chloride: 109 mmol/L (ref 98–111)
Creatinine, Ser: 1.17 mg/dL (ref 0.61–1.24)
GFR calc Af Amer: >60 mL/min (ref >60)
GFR calc non Af Amer: 58 mL/min — ABNORMAL LOW (ref >60)
Glucose, Bld: 100 mg/dL — ABNORMAL HIGH (ref 70–99)
Potassium: 3.6 mmol/L (ref 3.5–5.1)
Sodium: 142 mmol/L (ref 135–145)

## 2018-03-26 LAB — HEMOGLOBIN A1C
Hgb A1c MFr Bld: 5.7 % — ABNORMAL HIGH (ref 4.8–5.6)
Mean Plasma Glucose: 116.89 mg/dL

## 2018-03-26 LAB — VALPROIC ACID LEVEL: Valproic Acid Lvl: 40 ug/mL — ABNORMAL LOW (ref 50.0–100.0)

## 2018-03-26 MED ORDER — DIVALPROEX SODIUM 250 MG PO DR TAB
250.0000 mg | DELAYED_RELEASE_TABLET | Freq: Every morning | ORAL | Status: DC
  Administered 2018-03-27 – 2018-04-02 (×7): 250 mg via ORAL
  Filled 2018-03-26 (×7): qty 1

## 2018-03-26 MED ORDER — DICLOFENAC SODIUM 1 % TD GEL
2.0000 g | Freq: Every day | TRANSDERMAL | Status: DC | PRN
  Filled 2018-03-26: qty 100

## 2018-03-26 MED ORDER — LIDOCAINE 5 % EX PTCH
1.0000 | MEDICATED_PATCH | Freq: Every day | CUTANEOUS | Status: DC | PRN
  Administered 2018-03-26 – 2018-04-02 (×3): 1 via TRANSDERMAL
  Filled 2018-03-26 (×4): qty 1

## 2018-03-26 MED ORDER — DIVALPROEX SODIUM 250 MG PO DR TAB
500.0000 mg | DELAYED_RELEASE_TABLET | Freq: Every day | ORAL | Status: DC
  Administered 2018-03-27 – 2018-04-02 (×7): 500 mg via ORAL
  Filled 2018-03-26 (×7): qty 2

## 2018-03-26 MED ORDER — HYDROCODONE-ACETAMINOPHEN 10-325 MG PO TABS
1.0000 | ORAL_TABLET | ORAL | Status: DC | PRN
  Administered 2018-03-26 – 2018-03-31 (×17): 1 via ORAL
  Filled 2018-03-26 (×17): qty 1

## 2018-03-26 NOTE — Progress Notes (Signed)
Bilateral lower extremity venous duplex has been completed.   Preliminary results in CV Proc.   Abram Sander 03/26/2018 11:14 AM

## 2018-03-26 NOTE — Progress Notes (Signed)
MRI called and stated attempted to perform MRA on patient but was unsuccessful. MRI stated, "patient was all over the place." Patient being sent back to floor. Will continue to monitor.

## 2018-03-26 NOTE — Evaluation (Signed)
Occupational Therapy Evaluation Patient Details Name: [name] MRN: [id number] DOB: 02-23-1934 Today's Date: 03/26/2018    History of Present Illness Patient is a 85 y/o male who presents with AMS and worsening RLE infection. Admitted with encephalopathy and possible absence seizures. 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 PMH includes A-fib, CAD, CHF, gait abnormality, vertigo, seizure, cervical fusion, RLE wound, memory deficits.    Clinical Impression   Pt admitted to OT with above and presents with deficits impacting pt ability to complete ADLs at [facility].  Pt willing to get OOB this session.  Completed bed mobility with min assist, required Min assist +2 for sit > stand from low hospital bed.  Therapist cuing for hand placement to increase safety during sit > stand.  Ambulated to recliner with RW and min assist, pt guarding Rt foot during ambulation due to pain.  Pt able to utilize BUE to engage in self-feeding tasks without issue.  Pt's son arrived at end of session, discussed PLOF, current level, and areas of concern.  Pt will benefit from OT acutely to increase independence with ADLs and prepare for d/c home with wife with follow up as listed below.    Follow Up Recommendations  Home health OT;Supervision/Assistance - 24 hour    Equipment Recommendations  None recommended by OT(has all equipment)       Precautions / Restrictions Precautions Precautions: Fall Restrictions Weight Bearing Restrictions: No      Mobility Bed Mobility   Bed Mobility: Supine to Sit     Supine to sit: Min assist        Transfers Overall transfer level: Needs assistance Equipment used: Rolling walker (2 wheeled) Transfers: Sit to/from Stand Sit to Stand: +2 physical assistance;Min assist         General transfer comment: Pt required min assist +2 from edge of bed in lowest position.  Min assist once upright and ambulated ~8 feet with RW with Min assist         ADL either performed or assessed with clinical judgement   ADL Overall ADL's : Needs assistance/impaired Eating/Feeding: Set up   Grooming: Set up   Upper Body Bathing: Min guard;Sitting Upper Body Bathing Details (indicate cue type and reason): Min guard for sitting balance at EOB Lower Body Bathing: Minimal assistance   Upper Body Dressing : Min guard   Lower Body Dressing: Moderate assistance;Sit to/from stand Lower Body Dressing Details (indicate cue type and reason): Pt able to don socks in figure 4 position, min assist for standing balance Toilet Transfer: Minimal assistance;RW           Functional mobility during ADLs: Rolling walker;Min guard General ADL Comments: Pt able to obtain figure 4 position to don hospital socks.  Therapist provided min guard during sitting balance.  Min assist +2 for sit > stand and then min assist ambulating to recliner with RW     Vision Baseline Vision/History: Wears glasses Wears Glasses: At all times Patient Visual Report: No change from baseline Vision Assessment?: No apparent visual deficits            Pertinent Vitals/Pain Pain Assessment: 0-10 Pain Score: 3  Pain Location: R LE Pain Descriptors / Indicators: Aching;Discomfort Pain Intervention(s): Limited activity within patient's tolerance;Monitored during session     Hand Dominance Right   Extremity/Trunk Assessment Upper Extremity Assessment Upper Extremity Assessment: Overall WFL for tasks assessed           Communication Communication Communication: No difficulties  Cognition Arousal/Alertness: Awake/alert Behavior During Therapy: WFL for tasks assessed/performed Overall Cognitive Status: Impaired/Different from baseline Area of Impairment: Orientation;Awareness                 Orientation Level: Disoriented to;Situation             General Comments: Pt able to state name, DOB including year, and oriented to hospital              Home  Living Family/patient expects to be discharged to:: Private residence Living Arrangements: Spouse/significant other Available Help at Discharge: Family;Available 24 hours/day Type of Home: House Home Access: Ramped entrance     Home Layout: One level     Bathroom Shower/Tub: Tub/shower unit         Home Equipment: Environmental consultant - 4 wheels;Cane - single point;Bedside commode;Shower seat;Grab bars - tub/shower;Wheelchair - manual;Transport chair;Hospital bed          Prior Functioning/Environment Level of Independence: Needs assistance  Gait / Transfers Assistance Needed: Per wife, since last hospitalization pt has been transferring to w/c and not doing much walking. If he does it is short distances in the house with w/c follow and gait belt donned. ADL's / Homemaking Assistance Needed: pt reports wife assisting with 50% of bathing/dressing   Comments: Has had several admissions recently.        OT Problem List: Decreased strength;Decreased activity tolerance;Impaired balance (sitting and/or standing);Pain      OT Treatment/Interventions: Self-care/ADL training;Therapeutic activities;Patient/family education    OT Goals(Current goals can be found in the care plan section) Acute Rehab OT Goals Patient Stated Goal: to get out of bed OT Goal Formulation: With patient Time For Goal Achievement: 04/09/18 Potential to Achieve Goals: Good  OT Frequency: Min 2X/week           Co-evaluation PT/OT/SLP Co-Evaluation/Treatment: Yes Reason for Co-Treatment: Complexity of the patient's impairments (multi-system involvement);To address functional/ADL transfers   OT goals addressed during session: ADL's and self-care      AM-PAC OT "6 Clicks" Daily Activity     Outcome Measure Help from another person eating meals?: A Little Help from another person taking care of personal grooming?: A Little Help from another person toileting, which includes using toliet, bedpan, or urinal?: A  Little Help from another person bathing (including washing, rinsing, drying)?: A Little Help from another person to put on and taking off regular upper body clothing?: A Little Help from another person to put on and taking off regular lower body clothing?: A Lot 6 Click Score: 17   End of Session Equipment Utilized During Treatment: Gait belt;Rolling walker Nurse Communication: Mobility status  Activity Tolerance: Patient tolerated treatment well Patient left: in chair;with call bell/phone within reach;with chair alarm set  OT Visit Diagnosis: Unsteadiness on feet (R26.81);Muscle weakness (generalized) (M62.81);Pain Pain - Right/Left: Right Pain - part of body: Ankle and joints of foot                Time: 2353-6144 OT Time Calculation (min): 25 min Charges:  OT General Charges $OT Visit: 1 Visit OT Evaluation $OT Eval Moderate Complexity: [place], [place], [phone] 03/26/2018, 4:07 PM

## 2018-03-26 NOTE — Progress Notes (Addendum)
STROKE TEAM PROGRESS NOTE   INTERVAL HISTORY His wife and son are at the bedside.  Patient is awake and alert. Able to follow commands. EEG: pending. Patient refused EEG yesterday, but is agreeable today. Will keep patient on PTA dose of AED depakote.  Vitals:   03/25/18 1939 03/25/18 2339 03/26/18 0339 03/26/18 1358  BP:   124/70 (!) 148/77  Pulse:    90  Resp: 12 17 13 16   Temp:      TempSrc:      SpO2: 99% 98% 100% 96%  Weight:      Height:        CBC:  Recent Labs  Lab 03/24/18 1026 03/26/18 0922  WBC 8.6 8.9  NEUTROABS 5.5 6.8  HGB 9.6* 9.5*  HCT 31.7* 30.3*  MCV 107.5* 102.4*  PLT 238 756    Basic Metabolic Panel:  Recent Labs  Lab 03/25/18 1247 03/26/18 0445  NA 140 142  K 3.9 3.6  CL 105 109  CO2 25 25  GLUCOSE 107* 100*  BUN 26* 21  CREATININE 1.14 1.17  CALCIUM 8.4* 8.1*  MG 1.8  --    Lipid Panel:     Component Value Date/Time   CHOL 82 07/12/2017 0624   TRIG 45 07/12/2017 0624   HDL 46 07/12/2017 0624   CHOLHDL 1.8 07/12/2017 0624   VLDL 9 07/12/2017 0624   LDLCALC 27 07/12/2017 0624   HgbA1c:  Lab Results  Component Value Date   HGBA1C 5.0 07/12/2017   Urine Drug Screen:     Component Value Date/Time   LABOPIA POSITIVE (A) 07/01/2017 1524   COCAINSCRNUR NONE DETECTED 07/01/2017 1524   LABBENZ NONE DETECTED 07/01/2017 1524   AMPHETMU NONE DETECTED 07/01/2017 1524   THCU NONE DETECTED 07/01/2017 1524   LABBARB NONE DETECTED 07/01/2017 1524    Alcohol Level     Component Value Date/Time   ETH <10 07/01/2017 1612    IMAGING Ct Angio Head W Or Wo Contrast  Result Date: 03/24/2018 CLINICAL DATA:  History of atrial fibrillation with altered mental status. EXAM: CT ANGIOGRAPHY HEAD AND NECK TECHNIQUE: Multidetector CT imaging of the head and neck was performed using the standard protocol during bolus administration of intravenous contrast. Multiplanar CT image reconstructions and MIPs were obtained to evaluate the vascular anatomy.  Carotid stenosis measurements (when applicable) are obtained utilizing NASCET criteria, using the distal internal carotid diameter as the denominator. CONTRAST:  167mL ISOVUE-370 IOPAMIDOL (ISOVUE-370) INJECTION 76% COMPARISON:  Head CT 07/01/2017 FINDINGS: CTA NECK FINDINGS SKELETON: Long segment cervical spinal fusion, anteriorly from C3 to C6 and posteriorly from C2-T1. OTHER NECK: Normal pharynx, larynx and major salivary glands. No cervical lymphadenopathy. Unremarkable thyroid gland. UPPER CHEST: No pneumothorax or pleural effusion. No nodules or masses. AORTIC ARCH: There is no calcific atherosclerosis of the aortic arch. There is no aneurysm, dissection or hemodynamically significant stenosis of the visualized ascending aorta and aortic arch. Conventional 3 vessel aortic branching pattern. The visualized proximal subclavian arteries are widely patent. RIGHT CAROTID SYSTEM: --Common carotid artery: Widely patent origin without common carotid artery dissection or aneurysm. --Internal carotid artery: No dissection, occlusion or aneurysm. Mild atherosclerotic calcification at the carotid bifurcation without hemodynamically significant stenosis. --External carotid artery: No acute abnormality. LEFT CAROTID SYSTEM: --Common carotid artery: Widely patent origin without common carotid artery dissection or aneurysm. --Internal carotid artery: No dissection, occlusion or aneurysm. Mild atherosclerotic calcification at the carotid bifurcation without hemodynamically significant stenosis. --External carotid artery: No acute abnormality. VERTEBRAL ARTERIES:  Codominant configuration. There is atherosclerotic calcification of both vertebral artery origins. There is atherosclerotic calcification of both V4 segments. The V2 and V3 segments are normal. CTA HEAD FINDINGS POSTERIOR CIRCULATION: --Basilar artery: Normal. --Posterior cerebral arteries: Both are predominantly supplied by the posterior communicating arteries.  --Superior cerebellar arteries: Normal. --Inferior cerebellar arteries: Normal anterior and posterior inferior cerebellar arteries. ANTERIOR CIRCULATION: --Intracranial internal carotid arteries: Atherosclerotic calcification of the internal carotid arteries at the skull base without hemodynamically significant stenosis. --Anterior cerebral arteries: Normal. Both A1 segments are present. Patent anterior communicating artery. --Middle cerebral arteries: Normal. --Posterior communicating arteries: Present bilaterally. VENOUS SINUSES: As permitted by contrast timing, patent. ANATOMIC VARIANTS: None DELAYED PHASE: No parenchymal contrast enhancement. Review of the MIP images confirms the above findings. IMPRESSION: No emergent large vessel occlusion or hemodynamically significant stenosis of the arteries of the head and neck. Electronically Signed   By: Ulyses Jarred M.D.   On: 03/24/2018 18:39   Ct Angio Neck W Or Wo Contrast  Result Date: 03/24/2018 CLINICAL DATA:  History of atrial fibrillation with altered mental status. EXAM: CT ANGIOGRAPHY HEAD AND NECK TECHNIQUE: Multidetector CT imaging of the head and neck was performed using the standard protocol during bolus administration of intravenous contrast. Multiplanar CT image reconstructions and MIPs were obtained to evaluate the vascular anatomy. Carotid stenosis measurements (when applicable) are obtained utilizing NASCET criteria, using the distal internal carotid diameter as the denominator. CONTRAST:  11mL ISOVUE-370 IOPAMIDOL (ISOVUE-370) INJECTION 76% COMPARISON:  Head CT 07/01/2017 FINDINGS: CTA NECK FINDINGS SKELETON: Long segment cervical spinal fusion, anteriorly from C3 to C6 and posteriorly from C2-T1. OTHER NECK: Normal pharynx, larynx and major salivary glands. No cervical lymphadenopathy. Unremarkable thyroid gland. UPPER CHEST: No pneumothorax or pleural effusion. No nodules or masses. AORTIC ARCH: There is no calcific atherosclerosis of the  aortic arch. There is no aneurysm, dissection or hemodynamically significant stenosis of the visualized ascending aorta and aortic arch. Conventional 3 vessel aortic branching pattern. The visualized proximal subclavian arteries are widely patent. RIGHT CAROTID SYSTEM: --Common carotid artery: Widely patent origin without common carotid artery dissection or aneurysm. --Internal carotid artery: No dissection, occlusion or aneurysm. Mild atherosclerotic calcification at the carotid bifurcation without hemodynamically significant stenosis. --External carotid artery: No acute abnormality. LEFT CAROTID SYSTEM: --Common carotid artery: Widely patent origin without common carotid artery dissection or aneurysm. --Internal carotid artery: No dissection, occlusion or aneurysm. Mild atherosclerotic calcification at the carotid bifurcation without hemodynamically significant stenosis. --External carotid artery: No acute abnormality. VERTEBRAL ARTERIES: Codominant configuration. There is atherosclerotic calcification of both vertebral artery origins. There is atherosclerotic calcification of both V4 segments. The V2 and V3 segments are normal. CTA HEAD FINDINGS POSTERIOR CIRCULATION: --Basilar artery: Normal. --Posterior cerebral arteries: Both are predominantly supplied by the posterior communicating arteries. --Superior cerebellar arteries: Normal. --Inferior cerebellar arteries: Normal anterior and posterior inferior cerebellar arteries. ANTERIOR CIRCULATION: --Intracranial internal carotid arteries: Atherosclerotic calcification of the internal carotid arteries at the skull base without hemodynamically significant stenosis. --Anterior cerebral arteries: Normal. Both A1 segments are present. Patent anterior communicating artery. --Middle cerebral arteries: Normal. --Posterior communicating arteries: Present bilaterally. VENOUS SINUSES: As permitted by contrast timing, patent. ANATOMIC VARIANTS: None DELAYED PHASE: No  parenchymal contrast enhancement. Review of the MIP images confirms the above findings. IMPRESSION: No emergent large vessel occlusion or hemodynamically significant stenosis of the arteries of the head and neck. Electronically Signed   By: Ulyses Jarred M.D.   On: 03/24/2018 18:39   Mr Brain Wo Contrast  Result  Date: 03/25/2018 CLINICAL DATA:  Atrial fibrillation. Cognitive impairment. History of seizures. Acute presentation with altered mental status. EXAM: MRI HEAD WITHOUT CONTRAST TECHNIQUE: Multiplanar, multiecho pulse sequences of the brain and surrounding structures were obtained without intravenous contrast. COMPARISON:  CT studies 03/24/2018.  MRI 02/01/2015. FINDINGS: Brain: Punctate acute infarction in the right cerebellum. No other acute infarction or cause of restricted diffusion. The brainstem is normal. No other cerebellar finding. Cerebral hemispheres show age related atrophy with moderate chronic small-vessel ischemic changes of the deep white matter. No large vessel territory infarction. No mass lesion, hemorrhage, hydrocephalus or extra-axial collection. No mesial temporal lesion is seen. Vascular: Major vessels at the base of the brain show flow. Skull and upper cervical spine: Negative Sinuses/Orbits: Clear/normal Other: None IMPRESSION: Punctate acute infarction in the right cerebellum. Its not absolutely clear how this would relate to the clinical presentation. No second acute lesion. Generalized atrophy. Moderate chronic small-vessel ischemic changes of the cerebral hemispheric white matter. Electronically Signed   By: Nelson Chimes M.D.   On: 03/25/2018 08:24   Mr Tibia Fibula Right Wo Contrast  Result Date: 03/26/2018 CLINICAL DATA:  Chronic wound on the right lower leg. Cellulitis. Question abscess or osteomyelitis. EXAM: MRI OF LOWER RIGHT EXTREMITY WITHOUT CONTRAST TECHNIQUE: Multiplanar, multisequence MR imaging of the right lower leg was performed. No intravenous contrast was  administered. COMPARISON:  Plain films right ankle 03/13/2018. FINDINGS: Bones/Joint/Cartilage Mild marrow edema is seen in the distal 2.4 cm of the fibula worrisome for osteomyelitis. Bone marrow signal is otherwise unremarkable. No fracture or stress change. Artifact from knee replacement on the right is noted. Ligaments Negative. Muscles and Tendons Intact. There is fatty atrophy of lower leg musculature. No intramuscular fluid collection. Soft tissues Skin wound is seen just anterior to the distal fibula. No underlying abscess. IMPRESSION: Marrow edema in the distal 2.4 cm of the fibula worrisome for osteomyelitis. Negative for abscess. Atrophy of lower leg musculature is presumably related to disuse. Electronically Signed   By: Inge Rise M.D.   On: 03/26/2018 12:36   Vas [place] Lower Extremity Venous (dvt)  Result Date: 03/26/2018  Lower Venous Study Indications: Ulcer.  Limitations: Patient movement. Performing Technologist: Abram Sander RVS  Examination Guidelines: A complete evaluation includes B-mode imaging, spectral Doppler, color Doppler, and power Doppler as needed of all accessible portions of each vessel. Bilateral testing is considered an integral part of a complete examination. Limited examinations for reoccurring indications may be performed as noted.  Right Venous Findings: +---------+---------------+---------+-----------+----------+-------+          CompressibilityPhasicitySpontaneityPropertiesSummary +---------+---------------+---------+-----------+----------+-------+ CFV      Full           Yes      Yes                          +---------+---------------+---------+-----------+----------+-------+ SFJ      Full                                                 +---------+---------------+---------+-----------+----------+-------+ FV Prox  Full                                                 +---------+---------------+---------+-----------+----------+-------+  FV Mid    Full                                                 +---------+---------------+---------+-----------+----------+-------+ FV DistalFull                                                 +---------+---------------+---------+-----------+----------+-------+ PFV      Full                                                 +---------+---------------+---------+-----------+----------+-------+ POP      Full           Yes      Yes                          +---------+---------------+---------+-----------+----------+-------+ PTV      Full                                                 +---------+---------------+---------+-----------+----------+-------+ PERO     Full                                                 +---------+---------------+---------+-----------+----------+-------+  Left Venous Findings: +---------+---------------+---------+-----------+----------+-------+          CompressibilityPhasicitySpontaneityPropertiesSummary +---------+---------------+---------+-----------+----------+-------+ CFV      Full           Yes      Yes                          +---------+---------------+---------+-----------+----------+-------+ SFJ      Full                                                 +---------+---------------+---------+-----------+----------+-------+ FV Prox  Full                                                 +---------+---------------+---------+-----------+----------+-------+ FV Mid   Full                                                 +---------+---------------+---------+-----------+----------+-------+ FV DistalFull                                                 +---------+---------------+---------+-----------+----------+-------+  PFV      Full                                                 +---------+---------------+---------+-----------+----------+-------+ POP      Full           Yes      Yes                           +---------+---------------+---------+-----------+----------+-------+ PTV      Full                                                 +---------+---------------+---------+-----------+----------+-------+ PERO     Full                                                 +---------+---------------+---------+-----------+----------+-------+    Summary: Right: There is no evidence of deep vein thrombosis in the lower extremity. No cystic structure found in the popliteal fossa. Left: There is no evidence of deep vein thrombosis in the lower extremity. No cystic structure found in the popliteal fossa.  *See table(s) above for measurements and observations.    Preliminary     PHYSICAL EXAM  HEENT-  Normocephalic, no lesions, without obvious abnormality.  Normal external eye and conjunctiva.   Cardiovascular- S1-S2 audible, pulses palpable throughout   Lungs-no rhonchi or wheezing noted, no excessive working breathing.  Saturations within normal limits on RA Abdomen- All 4 quadrants palpated and nontender Extremities- Warm, dry and intact Musculoskeletal-no joint tenderness, deformity or swelling Skin-warm and dry, no hyperpigmentation, vitiligo, or suspicious lesions  Neurological Examination Mental Status: Alert, oriented, to person/age/month/year.  Speech fluent without evidence of aphasia.  Able to follow simple commands without difficulty. Cranial Nerves: II:  Visual fields grossly normal,  III,IV, VI: ptosis not present, extra-ocular motions intact bilaterally, pupils equal, round, reactive to light and accommodation V,VII: smile symmetric, facial light touch sensation normal bilaterally VIII: hearing normal bilaterally IX,X: uvula rises symmetrically XI: bilateral shoulder shrug XII: midline tongue extension Motor:  Right upper extremity 5/5.  Left upper extremity exam limited by pain in the shoulder but distally 5/5.  Right lower extremity 3/5 at the hip-mainly due to pain and  pre-existing premorbid right foot drop with 0 out of 5 plantar dorsiflexion the right lower extremity.  Left lower extremity nearly symmetric 5/5 antigravity without drift. Sensory: ight touch intact throughout, bilaterally Deep Tendon Reflexes: 2+ and symmetric throughout Cerebellar: FND intact; unable to perform HTS Gait: deferred  ASSESSMENT/PLAN [name] is a 85 y.o. male with history of a. Fib ( not on anticoagulation), RLE cellulitis, arthritis ( oxycodone and prednisone 10 mg), Seizures ( on depakote)  presenting with AMS.   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   MRI : Punctate acute infarction in the right  cerebellum. Its not absolutely clear how this would relate to the clinical presentation. No second acute lesion.  2D Echo  - Left ventricle: The cavity size was normal. Wall thickness was increased in a pattern of moderate LVH. Systolic function was   normal. The estimated ejection fraction was in the range of 55%   to 60%. Left ventricular diastolic function parameters were   normal. Aortic valve: There was mild regurgitation. Mitral valve: Calcified annulus. Mildly thickened leaflets . There was mild regurgitation. Valve area by pressure half-time:1.04 cm^2. - Left atrium: The atrium was mildly dilated. - Atrial septum: No defect or patent foramen ovale was identified  LE doppler: Right: There is no evidence of deep vein thrombosis in the lower extremity. No cystic structure found in the popliteal fossa.  Left: There is no evidence of deep vein thrombosis in the lower extremity. No cystic structure found in the popliteal fossa. ( preliminary report)  EEG 03/26/2018: pending   LDL pending   HgbA1c pending   SCD's for VTE  prophylaxis Diet Order            Diet Heart Room service appropriate? Yes; Fluid consistency: Thin  Diet effective now               No antithrombotic prior to admission, now on aspirin 325 mg daily.   Therapy recommendations:  SNF  Disposition:  Pending   Hypertension  Stable . Permissive hypertension (OK if < 220/120) but gradually normalize in 5-7 days . Long-term BP goal normotensive  Hyperlipidemia  Home meds:  Pravastatin 40 mg , resumed in hospital  LDL pending, goal < 70  Continue statin at discharge  Diabetes type II  HgbA1c pending, goal < 7.0  Controlled  Other Stroke Risk Factors  Advanced age  A. fib  Other Active Problems  Seizures: continue depakote 250 mg every morning, and Depakote 500 mg every night. ( per home regimen)   F/u outpatient Neurology Dr. Delice Lesch  Osteoarthritis: continue prednisone 10 mg   Hospital day # 1  Laurey Morale, MSN, NP-C [facility] [phone]  ATTENDING NOTE: I reviewed above note and agree with the assessment and plan. Pt was seen and examined.   85 year old male with history of A. fib not on AC, hypertension, hyperlipidemia, seizure on Depakote, right lower extremity cellulitis on antibiotics, CAD presented for breakthrough staring spells in the prolonged post ictal with altered mental status.  On examination, patient awake alert, not orientated to place but orientated to time people and self.  Facial symmetrical, PERRLA, EOMI, tongue midline, visual field full.  Moving all extremities symmetrically, sensation symmetrical, no ataxia, gait not tested.  CT no acute abnormality.  CTA head and neck unremarkable.  EF 55 to 60%.  MRI showed right cerebellum punctate infarct.  The stroke on MRI seems to be incidental finding which cannot explain patient symptoms.  However, the stroke could be due to small vessel disease or due to A. fib not on anticoagulation.  Patient has been following Dr.  Sallyanne Kuster, and was on Eliquis but developed left thigh large hematoma, therefore Eliquis was discontinued and put on aspirin 81.  Currently on aspirin 325 this admission.  Recommend continue aspirin 325, and to follow-up with Dr. Sallyanne Kuster as outpatient to further discuss about antiplatelet/anticoagulation regimen.  Patient has history of seizure with staring spells since 2017.  Was on Keppra but not able to tolerate, switched to Depakote, not able to tolerating high-dose, eventually on Depakote 250 in the  morning and 500 at night which controlled the seizure pretty well and also tolerated OK.  On this admission, Depakote level low 37, received 10mg /kg IV load, but this morning Depakote level was still 40.  As per wife, patient on 250/500 controlled seizure pretty good, will not chase Depakote level or change the regimen at this time, continue 250/500 on discharge.  Patient breakthrough seizure at this time likely due to UTI and cellulitis at the left lower extremity.  EEG negative for for seizure at this time.  LE venous Doppler negative for DVT.  Recommend continue treatment for UTI and cellulitis as per primary team.  Will refer patient to Dr. Delice Lesch at [facility] neurology for further seizure management.  Patient has been on prednisone for long time for possible rheumatoid arthritis.  Will continue.  Neurology will sign off. Please call with questions. Pt will refer to Dr. Delice Lesch at [facility] neurology in about 4 weeks. Thanks for the consult.   Rosalin Hawking, MD PhD Stroke Neurology 03/26/2018 6:35 PM     To contact Stroke Continuity provider, please refer to [email or web address] After hours, contact General Neurology

## 2018-03-26 NOTE — Progress Notes (Signed)
Physical Therapy Treatment Patient Details Name: [name] MRN: [id number] DOB: Jan 14, 1934 Today's Date: 03/26/2018    History of Present Illness Patient is a 85 y/o male who presents with AMS and worsening RLE infection. Admitted with encephalopathy and possible absence seizures. 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 PMH includes A-fib, CAD, CHF, gait abnormality, vertigo, seizure, cervical fusion, RLE wound, memory deficits.     PT Comments    Pt performed gait training within room and in better spirits from previous session.  Performed co-tx based on response to bed level eval last session.  Pt performed remarkably better and is appropriate to d/c home with support from his spouse.  Son present and reports he is impressed with his fathers progression.  Will increased frequency and update recommendations to HHPT.  Will inform supervising PT to cosign recommendations at this time based on progress.     Follow Up Recommendations  Home health PT;Supervision/Assistance - 24 hour     Equipment Recommendations  None recommended by PT    Recommendations for Other Services       Precautions / Restrictions Precautions Precautions: Fall Restrictions Weight Bearing Restrictions: No    Mobility  Bed Mobility Overal bed mobility: Needs Assistance Bed Mobility: Supine to Sit     Supine to sit: Min assist     General bed mobility comments: OT performed bed mobility during co tx.    Transfers Overall transfer level: Needs assistance Equipment used: Rolling walker (2 wheeled) Transfers: Sit to/from Stand Sit to Stand: +2 physical assistance;Min assist         General transfer comment: Pt required min assist +2 from edge of bed in lowest position.  Min +1 to maintain standing.  Cues for hand placement.  Good eccentric loading when returning to seated surface.    Ambulation/Gait Ambulation/Gait assistance: Min assist;+2 safety/equipment Gait Distance  (Feet): 8 Feet Assistive device: Rolling walker (2 wheeled) Gait Pattern/deviations: Trunk flexed;Step-through pattern     General Gait Details: Cues for upper trunk control and stepping closer to device.  Short distance performed as patient eager to sit down and eat his lunch.     Stairs             Wheelchair Mobility    Modified Rankin (Stroke Patients Only) Modified Rankin (Stroke Patients Only) Pre-Morbid Rankin Score: Moderately severe disability Modified Rankin: Moderately severe disability     Balance Overall balance assessment: Needs assistance   Sitting balance-Leahy Scale: Good       Standing balance-Leahy Scale: Poor                              Cognition Arousal/Alertness: Awake/alert Behavior During Therapy: WFL for tasks assessed/performed Overall Cognitive Status: Impaired/Different from baseline Area of Impairment: Orientation;Awareness                 Orientation Level: Disoriented to;Situation             General Comments: Pt able to state name, DOB including year, and oriented to hospital      Exercises      General Comments        Pertinent Vitals/Pain Pain Assessment: 0-10 Pain Score: 3  Pain Location: R LE Pain Descriptors / Indicators: Aching;Discomfort Pain Intervention(s): Limited activity within patient's tolerance;Monitored during session    [place] expects to be discharged to:: Private residence Living Arrangements: Spouse/significant other Available Help at Discharge:  Family;Available 24 hours/day Type of Home: House Home Access: Ramped entrance   Home Layout: One level Home Equipment: Walker - 4 wheels;Cane - single point;Bedside commode;Shower seat;Grab bars - tub/shower;Wheelchair - manual;Transport chair;Hospital bed      Prior Function Level of Independence: Needs assistance  Gait / Transfers Assistance Needed: Per wife, since last hospitalization pt has been  transferring to w/c and not doing much walking. If he does it is short distances in the house with w/c follow and gait belt donned. ADL's / Homemaking Assistance Needed: pt reports wife assisting with 50% of bathing/dressing Comments: Has had several admissions recently.   PT Goals (current goals can now be found in the care plan section) Acute Rehab PT Goals Patient Stated Goal: to get out of bed Potential to Achieve Goals: Fair Progress towards PT goals: Progressing toward goals    Frequency    Min 3X/week      PT Plan Discharge plan needs to be updated    Co-evaluation PT/OT/SLP Co-Evaluation/Treatment: Yes Reason for Co-Treatment: Complexity of the patient's impairments (multi-system involvement);For patient/therapist safety PT goals addressed during session: Mobility/safety with mobility OT goals addressed during session: ADL's and self-care      AM-PAC PT "6 Clicks" Mobility   Outcome Measure  Help needed turning from your back to your side while in a flat bed without using bedrails?: A Little Help needed moving from lying on your back to sitting on the side of a flat bed without using bedrails?: A Little Help needed moving to and from a bed to a chair (including a wheelchair)?: A Little Help needed standing up from a chair using your arms (e.g., wheelchair or bedside chair)?: A Little Help needed to walk in hospital room?: A Little Help needed climbing 3-5 steps with a railing? : A Little 6 Click Score: 18    End of Session Equipment Utilized During Treatment: Gait belt Activity Tolerance: Patient tolerated treatment well Patient left: in chair;with call bell/phone within reach;with chair alarm set Nurse Communication: Mobility status PT Visit Diagnosis: Muscle weakness (generalized) (M62.81);Pain Pain - Right/Left: Right Pain - part of body: Leg     Time: 2641-5830 PT Time Calculation (min) (ACUTE ONLY): 24 min  Charges:  $Gait Training: 8-22 mins                      [name], PTA Acute Rehabilitation Services Pager [phone] Office [phone]     [name] 03/26/2018, 4:29 PM

## 2018-03-26 NOTE — Progress Notes (Addendum)
PROGRESS NOTE    [name]  OIZ:[id number] DOB: October 11, 1933 DOA: 03/24/2018 PCP: [name], MD   Brief Narrative: The patient is an 85 year old male with history of stroke, A. fib off anticoagulation for past 6-7 months due to lower extremity hematoma secondary to fall, seizure disorder, chronic kidney disease stage III, chronic diastolic CHF, OSA, CAD , was hospitalized from 1/4-1/8 with sepsis secondary to cellulitis of the right leg.  He has chronic right leg wound for which he was followed at the wound care center.  Patient was treated with IV vancomycin and Zosyn and Flagyl and discharged on Keflex + doxycycline. For past 2 days patient was increasingly confused and somnolent per his wife. Brought to the ED where tele-neurology was concern for subclinical seizures versus stroke.  Vitals were stable while Depakote level was subtherapeutic.  Given a loading dose of Depakote and transferred to [facility] for further work-up by neurology. CT head, CT angiogram head and neck negative for acute findings.  MRI of the brain showed punctate acute infarct in the right cerebellum.  **He undergoing full neurological work-up and obtaining a MRI of the right leg to rule out osteomyelitis.  Was in some pain to resume his home pain medications today and patient agreeable to EEG.  Assessment & Plan:   Principal Problem:   Altered mental status Active Problems:   Essential hypertension   Seizure disorder (HCC)   GERD (gastroesophageal reflux disease)   Memory difficulty   CKD (chronic kidney disease) stage 3, GFR 30-59 ml/min (HCC)   Chronic diastolic (congestive) heart failure (HCC)   Chronic atrial fibrillation   Coronary artery disease involving native coronary artery of native heart without angina pectoris   Cellulitis   TIA (transient ischemic attack)   Encephalopathy  Acute metabolic encephalopathy/acute ischemic stroke -MRI brain showing punctate acute infarct in the right  cerebellum. -Continue statin.  Switch to full dose aspirin. -Check 2D echo, Carotid Doppler, MRA head.   -Further recommendations per stroke team. -EEG recommended by neurology but patient refused but had it done today -ECHO showed EF of 55-60% -Check HbA1x and Lipid Panel as below  -Continue neurochecks.  Reportedly has mild memory impairment.   -SLP evaluation, PT/OT eval. -C/w ASA 325 mg po Daily and pravastatin 40 mg p.o. daily -PT/OT recommending Home Health PT  Essential Hypertension -Allow permissive HTN -Neurology permissive hypertension is okay if blood pressure is less than 220/120 but gradually normalize in 5 to 7 days with long-term blood pressure goal being normotensive.  Persistent right leg cellulitis with tibial ulcer -No signs of sepsis clinically.   -Wife reports that the ulceration in the lateral tibia has worsened since his hospital discharge.   -I suspect this has progressed since recent discharge as wound care consult was not asked to evaluate during his recent hospitalization. -Continue vancomycin and cefepime.   -Will obtain MRI of the tibia to rule out osteomyelitis.   -Wound care consulted.  NSVT -Noted for 10 runs of NSVT on the monitor yesterday morning.  Potassium normal at 3.6.  MG 1.8, will replenish.  Seizure disorder (HCC) -Subtherapeutic valproate level, received loading dose in the ED and continued valproate.  Refused EEG initially but had it done today. -Neurology Following   GERD (gastroesophageal reflux disease) -Continue PPI with Pantoprazole 40 mg po Daily   CKD (chronic kidney disease) stage 3, GFR 30-59 ml/min (HCC) -Renal function appears at baseline.  -BUN/Cr is now 21/1.17 -Continue to Monitor and Trend Renal Function -  Repeat CMP in AM   Chronic Diastolic (congestive) Heart Failure ([place]) -Euvolemic. -Was getting IVF Hydration that has now been D/C'd -C/w Metoprolol Tartrate 25 mg po BID and Furosemide 40 mg po Daily    Chronic Atrial Fibrillation -Rate controlled.  Not on anticoagulation due to leg hematoma in the past. -C/w Metoprolol Tartrate 25 mg po Daily     Coronary artery disease involving native coronary artery of native heart without angina pectoris -C/w ASA 325 mg po Daily, Metoprolol Tartrate 25 mg po BID -C/w Pravastatin 40 mg po Daily  HLD -Checking fasting lipid panel showed cholesterol level of 88, HDL 38, LDL 35, triglycerides of 75, and VLDL of 15 -C/w Ezetimibe 5 mg po qHS and Pravastatin 40 mg po Daily  Nonhealing right leg ulcer -Was seen by ?  Dr. Trula Slade in the past.   -Wife reports patient has an appointment later this month.   -Obtaining Right Leg MRI and still pending to be done -If MRI of the leg negative for osteomyelitis vascular surgery needs to be consulted -See Above.  Hypothyroidism -Continue Levothyroxine 150 mcg po Daily   Chronic arthritis and chronic pain syndrome -Continue with home Oxycodone and Prednisone -Continue with lidocaine patch  Macrocytic Anemia -Patient's hemoglobin/hematocrit is now 9.5/30.3 -Need to monitor for signs and symptoms of bleeding -Check anemia panel in the a.m. -Repeat CBC in a.m.  Impaired fasting glucose/Pre-Diabetes -Hemoglobin A1c was 5.7 -Sugars on daily BMPs and CMP's have been 100 207 -Dietary education given -If blood sugars remain consistently elevated will place on sensitive NovoLog sliding scale insulin  DVT prophylaxis:  Code Status: FULL CODE Family Communication: Discussed with wife at bedside  Disposition Plan: Pending Further workup and evaluation; Home Health PT/OT at D/C  Consultants:   Neurology    Procedures: CT head, CT angio head and neck, MRI brain, pending MRA head,echo, MRI rt tibia  Bilateral LE Venous Duplex -Right: There is no evidence of deep vein thrombosis in the lower extremity. No cystic structure found in the popliteal fossa. Left: There is no evidence of deep vein thrombosis  in the lower extremity. No cystic structure found in the popliteal fossa.   Antimicrobials:  Anti-infectives (From admission, onward)   Start     Dose/Rate Route Frequency Ordered Stop   03/24/18 2200  ceFEPIme (MAXIPIME) 1 g in sodium chloride 0.9 % 100 mL IVPB     1 g 200 mL/hr over 30 Minutes Intravenous Every 12 hours 03/24/18 1921     03/24/18 2200  vancomycin (VANCOCIN) IVPB 1000 mg/200 mL premix     1,000 mg 200 mL/hr over 60 Minutes Intravenous Every 24 hours 03/24/18 1929     03/24/18 1845  vancomycin (VANCOCIN) IVPB 1000 mg/200 mL premix  Status:  Discontinued     1,000 mg 200 mL/hr over 60 Minutes Intravenous Every 12 hours 03/24/18 1832 03/24/18 1928   03/24/18 1045  ceFEPIme (MAXIPIME) 2 g in sodium chloride 0.9 % 100 mL IVPB     2 g 200 mL/hr over 30 Minutes Intravenous  Once 03/24/18 1038 03/24/18 1129   03/24/18 1045  metroNIDAZOLE (FLAGYL) IVPB 500 mg  Status:  Discontinued     500 mg 100 mL/hr over 60 Minutes Intravenous Every 8 hours 03/24/18 1038 03/24/18 1954   03/24/18 1045  vancomycin (VANCOCIN) IVPB 1000 mg/200 mL premix     1,000 mg 200 mL/hr over 60 Minutes Intravenous  Once 03/24/18 1038 03/25/18 1829     Subjective: After he just  come back from an tender MRI and he was very agitated.  States he is not feeling very well.  And he was in pain.  No chest pain, lightheadedness but had diffuse body pains in the joints.  No nausea or vomiting.  Wife thinks that not being medicated with pain medication prior to his MRI causing her to be very fidgety and MRI.  No other concerns or complaints at this time and is agreeable to his EEG today.  Objective: Vitals:   03/25/18 1939 03/25/18 2339 03/26/18 0339 03/26/18 1358  BP:   124/70 (!) 148/77  Pulse:    90  Resp: 12 17 13 16   Temp:      TempSrc:      SpO2: 99% 98% 100% 96%  Weight:      Height:        Intake/Output Summary (Last 24 hours) at 03/26/2018 1737 Last data filed at 03/26/2018 1610 Gross per 24 hour   Intake 1735.55 ml  Output 2300 ml  Net -564.45 ml   Filed Weights   03/24/18 1142  Weight: 74.8 kg   Examination: Physical Exam:  Constitutional: WN/WD Caucasian male in NAD and appears anxious and uncomfortable  Eyes: Lids and conjunctivae normal, sclerae anicteric  ENMT: External Ears, Nose appear normal. Grossly normal hearing. Mucous membranes are moist..  Neck: Appears normal, supple, no cervical masses, normal ROM, no appreciable thyromegaly; no JVD Respiratory: Diminished to auscultation bilaterally, no wheezing, rales, rhonchi or crackles. Normal respiratory effort and patient is not tachypenic. No accessory muscle use.  Cardiovascular: Irregularly Irregular, no murmurs / rubs / gallops. S1 and S2 auscultated. Trace extremity edema.  Abdomen: Soft, non-tender, non-distended. No masses palpated. No appreciable hepatosplenomegaly. Bowel sounds positive x4.  GU: Deferred. Musculoskeletal: No clubbing / cyanosis of digits/nails. No joint deformity upper and lower extremities. Skin: Has bruising and ecchymosis along with a few small skin tears on the upper extremities. No induration; Warm and dry.  Neurologic: CN 2-12 grossly intact with no focal deficits.  Romberg sign and cerebellar reflexes not assessed.  Psychiatric: Normal judgment and insight. Alert and oriented x 2. Anxious mood and appropriate affect.   Data Reviewed: I have personally reviewed following labs and imaging studies  CBC: Recent Labs  Lab 03/24/18 1026 03/26/18 0922  WBC 8.6 8.9  NEUTROABS 5.5 6.8  HGB 9.6* 9.5*  HCT 31.7* 30.3*  MCV 107.5* 102.4*  PLT 238 960   Basic Metabolic Panel: Recent Labs  Lab 03/24/18 1026 03/25/18 1247 03/26/18 0445  NA 141 140 142  K 3.7 3.9 3.6  CL 106 105 109  CO2 24 25 25   GLUCOSE 163* 107* 100*  BUN 47* 26* 21  CREATININE 1.39* 1.14 1.17  CALCIUM 8.6* 8.4* 8.1*  MG  --  1.8  --    GFR: Estimated Creatinine Clearance: 48.7 mL/min (by C-G formula based on  SCr of 1.17 mg/dL). Liver Function Tests: Recent Labs  Lab 03/24/18 1026  AST 43*  ALT 56*  ALKPHOS 70  BILITOT 0.6  PROT 5.8*  ALBUMIN 3.2*   No results for input(s): LIPASE, AMYLASE in the last 168 hours. No results for input(s): AMMONIA in the last 168 hours. Coagulation Profile: Recent Labs  Lab 03/24/18 1026  INR 1.16   Cardiac Enzymes: No results for input(s): CKTOTAL, CKMB, CKMBINDEX, TROPONINI in the last 168 hours. BNP (last 3 results) Recent Labs    09/30/17 1107 10/22/17 1120 11/22/17 1130  PROBNP 3,272* 3,243* 3,962*   HbA1C:  Recent Labs    03/26/18 0922  HGBA1C 5.7*   CBG: Recent Labs  Lab 03/25/18 0645  GLUCAP 34   Lipid Profile: Recent Labs    03/26/18 0445  CHOL 88  HDL 38*  LDLCALC 35  TRIG 75  CHOLHDL 2.3   Thyroid Function Tests: No results for input(s): TSH, T4TOTAL, FREET4, T3FREE, THYROIDAB in the last 72 hours. Anemia Panel: No results for input(s): VITAMINB12, FOLATE, FERRITIN, TIBC, IRON, RETICCTPCT in the last 72 hours. Sepsis Labs: Recent Labs  Lab 03/24/18 1027  LATICACIDVEN 1.00    Recent Results (from the past 240 hour(s))  Culture, blood (Routine x 2)     Status: None (Preliminary result)   Collection Time: 03/24/18 10:27 AM  Result Value Ref Range Status   Specimen Description   Final    BLOOD LEFT ANTECUBITAL Performed at [facility] [address], [name], [place] [address]    Special Requests   Final    BOTTLES DRAWN AEROBIC AND ANAEROBIC Blood Culture adequate volume Performed at [facility] [address], [place], [place] [address]    Culture   Final    NO GROWTH 2 DAYS Performed at [facility] [address], [place], [place] [address]    Report Status PENDING  Incomplete  Culture, blood (Routine x 2)     Status: None (Preliminary result)   Collection Time: 03/24/18 10:28 AM  Result Value Ref Range Status   Specimen Description   Final    BLOOD  RIGHT HAND Performed at [facility] [address], [place], [place] [address]    Special Requests   Final    BOTTLES DRAWN AEROBIC AND ANAEROBIC Blood Culture results may not be optimal due to an excessive volume of blood received in culture bottles Performed at [facility] [address], [place], [place] [address]    Culture   Final    NO GROWTH 2 DAYS Performed at [facility] [address] [place], [place] [address]    Report Status PENDING  Incomplete    Radiology Studies: Ct Angio Head W Or Wo Contrast  Result Date: 03/24/2018 CLINICAL DATA:  History of atrial fibrillation with altered mental status. EXAM: CT ANGIOGRAPHY HEAD AND NECK TECHNIQUE: Multidetector CT imaging of the head and neck was performed using the standard protocol during bolus administration of intravenous contrast. Multiplanar CT image reconstructions and MIPs were obtained to evaluate the vascular anatomy. Carotid stenosis measurements (when applicable) are obtained utilizing NASCET criteria, using the distal internal carotid diameter as the denominator. CONTRAST:  150mL ISOVUE-370 IOPAMIDOL (ISOVUE-370) INJECTION 76% COMPARISON:  Head CT 07/01/2017 FINDINGS: CTA NECK FINDINGS SKELETON: Long segment cervical spinal fusion, anteriorly from C3 to C6 and posteriorly from C2-T1. OTHER NECK: Normal pharynx, larynx and major salivary glands. No cervical lymphadenopathy. Unremarkable thyroid gland. UPPER CHEST: No pneumothorax or pleural effusion. No nodules or masses. AORTIC ARCH: There is no calcific atherosclerosis of the aortic arch. There is no aneurysm, dissection or hemodynamically significant stenosis of the visualized ascending aorta and aortic arch. Conventional 3 vessel aortic branching pattern. The visualized proximal subclavian arteries are widely patent. RIGHT CAROTID SYSTEM: --Common carotid artery: Widely patent origin without common carotid artery dissection or  aneurysm. --Internal carotid artery: No dissection, occlusion or aneurysm. Mild atherosclerotic calcification at the carotid bifurcation without hemodynamically significant stenosis. --External carotid artery: No acute abnormality. LEFT CAROTID SYSTEM: --Common carotid artery: Widely patent origin without  common carotid artery dissection or aneurysm. --Internal carotid artery: No dissection, occlusion or aneurysm. Mild atherosclerotic calcification at the carotid bifurcation without hemodynamically significant stenosis. --External carotid artery: No acute abnormality. VERTEBRAL ARTERIES: Codominant configuration. There is atherosclerotic calcification of both vertebral artery origins. There is atherosclerotic calcification of both V4 segments. The V2 and V3 segments are normal. CTA HEAD FINDINGS POSTERIOR CIRCULATION: --Basilar artery: Normal. --Posterior cerebral arteries: Both are predominantly supplied by the posterior communicating arteries. --Superior cerebellar arteries: Normal. --Inferior cerebellar arteries: Normal anterior and posterior inferior cerebellar arteries. ANTERIOR CIRCULATION: --Intracranial internal carotid arteries: Atherosclerotic calcification of the internal carotid arteries at the skull base without hemodynamically significant stenosis. --Anterior cerebral arteries: Normal. Both A1 segments are present. Patent anterior communicating artery. --Middle cerebral arteries: Normal. --Posterior communicating arteries: Present bilaterally. VENOUS SINUSES: As permitted by contrast timing, patent. ANATOMIC VARIANTS: None DELAYED PHASE: No parenchymal contrast enhancement. Review of the MIP images confirms the above findings. IMPRESSION: No emergent large vessel occlusion or hemodynamically significant stenosis of the arteries of the head and neck. Electronically Signed   By: Ulyses Jarred M.D.   On: 03/24/2018 18:39   Ct Angio Neck W Or Wo Contrast  Result Date: 03/24/2018 CLINICAL DATA:  History  of atrial fibrillation with altered mental status. EXAM: CT ANGIOGRAPHY HEAD AND NECK TECHNIQUE: Multidetector CT imaging of the head and neck was performed using the standard protocol during bolus administration of intravenous contrast. Multiplanar CT image reconstructions and MIPs were obtained to evaluate the vascular anatomy. Carotid stenosis measurements (when applicable) are obtained utilizing NASCET criteria, using the distal internal carotid diameter as the denominator. CONTRAST:  117mL ISOVUE-370 IOPAMIDOL (ISOVUE-370) INJECTION 76% COMPARISON:  Head CT 07/01/2017 FINDINGS: CTA NECK FINDINGS SKELETON: Long segment cervical spinal fusion, anteriorly from C3 to C6 and posteriorly from C2-T1. OTHER NECK: Normal pharynx, larynx and major salivary glands. No cervical lymphadenopathy. Unremarkable thyroid gland. UPPER CHEST: No pneumothorax or pleural effusion. No nodules or masses. AORTIC ARCH: There is no calcific atherosclerosis of the aortic arch. There is no aneurysm, dissection or hemodynamically significant stenosis of the visualized ascending aorta and aortic arch. Conventional 3 vessel aortic branching pattern. The visualized proximal subclavian arteries are widely patent. RIGHT CAROTID SYSTEM: --Common carotid artery: Widely patent origin without common carotid artery dissection or aneurysm. --Internal carotid artery: No dissection, occlusion or aneurysm. Mild atherosclerotic calcification at the carotid bifurcation without hemodynamically significant stenosis. --External carotid artery: No acute abnormality. LEFT CAROTID SYSTEM: --Common carotid artery: Widely patent origin without common carotid artery dissection or aneurysm. --Internal carotid artery: No dissection, occlusion or aneurysm. Mild atherosclerotic calcification at the carotid bifurcation without hemodynamically significant stenosis. --External carotid artery: No acute abnormality. VERTEBRAL ARTERIES: Codominant configuration. There is  atherosclerotic calcification of both vertebral artery origins. There is atherosclerotic calcification of both V4 segments. The V2 and V3 segments are normal. CTA HEAD FINDINGS POSTERIOR CIRCULATION: --Basilar artery: Normal. --Posterior cerebral arteries: Both are predominantly supplied by the posterior communicating arteries. --Superior cerebellar arteries: Normal. --Inferior cerebellar arteries: Normal anterior and posterior inferior cerebellar arteries. ANTERIOR CIRCULATION: --Intracranial internal carotid arteries: Atherosclerotic calcification of the internal carotid arteries at the skull base without hemodynamically significant stenosis. --Anterior cerebral arteries: Normal. Both A1 segments are present. Patent anterior communicating artery. --Middle cerebral arteries: Normal. --Posterior communicating arteries: Present bilaterally. VENOUS SINUSES: As permitted by contrast timing, patent. ANATOMIC VARIANTS: None DELAYED PHASE: No parenchymal contrast enhancement. Review of the MIP images confirms the above findings. IMPRESSION: No emergent large vessel occlusion or hemodynamically  significant stenosis of the arteries of the head and neck. Electronically Signed   By: Ulyses Jarred M.D.   On: 03/24/2018 18:39   Mr Brain Wo Contrast  Result Date: 03/25/2018 CLINICAL DATA:  Atrial fibrillation. Cognitive impairment. History of seizures. Acute presentation with altered mental status. EXAM: MRI HEAD WITHOUT CONTRAST TECHNIQUE: Multiplanar, multiecho pulse sequences of the brain and surrounding structures were obtained without intravenous contrast. COMPARISON:  CT studies 03/24/2018.  MRI 02/01/2015. FINDINGS: Brain: Punctate acute infarction in the right cerebellum. No other acute infarction or cause of restricted diffusion. The brainstem is normal. No other cerebellar finding. Cerebral hemispheres show age related atrophy with moderate chronic small-vessel ischemic changes of the deep white matter. No large  vessel territory infarction. No mass lesion, hemorrhage, hydrocephalus or extra-axial collection. No mesial temporal lesion is seen. Vascular: Major vessels at the base of the brain show flow. Skull and upper cervical spine: Negative Sinuses/Orbits: Clear/normal Other: None IMPRESSION: Punctate acute infarction in the right cerebellum. Its not absolutely clear how this would relate to the clinical presentation. No second acute lesion. Generalized atrophy. Moderate chronic small-vessel ischemic changes of the cerebral hemispheric white matter. Electronically Signed   By: Nelson Chimes M.D.   On: 03/25/2018 08:24   Mr Tibia Fibula Right Wo Contrast  Result Date: 03/26/2018 CLINICAL DATA:  Chronic wound on the right lower leg. Cellulitis. Question abscess or osteomyelitis. EXAM: MRI OF LOWER RIGHT EXTREMITY WITHOUT CONTRAST TECHNIQUE: Multiplanar, multisequence MR imaging of the right lower leg was performed. No intravenous contrast was administered. COMPARISON:  Plain films right ankle 03/13/2018. FINDINGS: Bones/Joint/Cartilage Mild marrow edema is seen in the distal 2.4 cm of the fibula worrisome for osteomyelitis. Bone marrow signal is otherwise unremarkable. No fracture or stress change. Artifact from knee replacement on the right is noted. Ligaments Negative. Muscles and Tendons Intact. There is fatty atrophy of lower leg musculature. No intramuscular fluid collection. Soft tissues Skin wound is seen just anterior to the distal fibula. No underlying abscess. IMPRESSION: Marrow edema in the distal 2.4 cm of the fibula worrisome for osteomyelitis. Negative for abscess. Atrophy of lower leg musculature is presumably related to disuse. Electronically Signed   By: Inge Rise M.D.   On: 03/26/2018 12:36   Vas [place] Lower Extremity Venous (dvt)  Result Date: 03/26/2018  Lower Venous Study Indications: Ulcer.  Limitations: Patient movement. Performing Technologist: Abram Sander RVS  Examination Guidelines: A  complete evaluation includes B-mode imaging, spectral Doppler, color Doppler, and power Doppler as needed of all accessible portions of each vessel. Bilateral testing is considered an integral part of a complete examination. Limited examinations for reoccurring indications may be performed as noted.  Right Venous Findings: +---------+---------------+---------+-----------+----------+-------+          CompressibilityPhasicitySpontaneityPropertiesSummary +---------+---------------+---------+-----------+----------+-------+ CFV      Full           Yes      Yes                          +---------+---------------+---------+-----------+----------+-------+ SFJ      Full                                                 +---------+---------------+---------+-----------+----------+-------+ FV Prox  Full                                                 +---------+---------------+---------+-----------+----------+-------+  FV Mid   Full                                                 +---------+---------------+---------+-----------+----------+-------+ FV DistalFull                                                 +---------+---------------+---------+-----------+----------+-------+ PFV      Full                                                 +---------+---------------+---------+-----------+----------+-------+ POP      Full           Yes      Yes                          +---------+---------------+---------+-----------+----------+-------+ PTV      Full                                                 +---------+---------------+---------+-----------+----------+-------+ PERO     Full                                                 +---------+---------------+---------+-----------+----------+-------+  Left Venous Findings: +---------+---------------+---------+-----------+----------+-------+          CompressibilityPhasicitySpontaneityPropertiesSummary  +---------+---------------+---------+-----------+----------+-------+ CFV      Full           Yes      Yes                          +---------+---------------+---------+-----------+----------+-------+ SFJ      Full                                                 +---------+---------------+---------+-----------+----------+-------+ FV Prox  Full                                                 +---------+---------------+---------+-----------+----------+-------+ FV Mid   Full                                                 +---------+---------------+---------+-----------+----------+-------+ FV DistalFull                                                 +---------+---------------+---------+-----------+----------+-------+  PFV      Full                                                 +---------+---------------+---------+-----------+----------+-------+ POP      Full           Yes      Yes                          +---------+---------------+---------+-----------+----------+-------+ PTV      Full                                                 +---------+---------------+---------+-----------+----------+-------+ PERO     Full                                                 +---------+---------------+---------+-----------+----------+-------+    Summary: Right: There is no evidence of deep vein thrombosis in the lower extremity. No cystic structure found in the popliteal fossa. Left: There is no evidence of deep vein thrombosis in the lower extremity. No cystic structure found in the popliteal fossa.  *See table(s) above for measurements and observations.    Preliminary    Scheduled Meds: .  stroke: mapping our early stages of recovery book   Does not apply Once  . aspirin EC  325 mg Oral Daily  . diltiazem  300 mg Oral Daily  . divalproex  500 mg Oral Q12H  . doxazosin  4 mg Oral QPM  . ezetimibe  5 mg Oral QHS  . furosemide  40 mg Oral Daily  .  levothyroxine  150 mcg Oral QAC breakfast  . metoprolol tartrate  25 mg Oral BID PC  . pantoprazole  40 mg Oral Daily  . pravastatin  40 mg Oral QPM  . predniSONE  10 mg Oral Q breakfast   Continuous Infusions: . sodium chloride 75 mL/hr at 03/26/18 0300  . ceFEPime (MAXIPIME) IV 1 g (03/26/18 0620)  . vancomycin 1,000 mg (03/26/18 0917)    LOS: 1 day   Kerney Elbe, DO [facility] PAGER is on [place]  If 7PM-7AM, please contact night-coverage [email or web address] Password [facility] 03/26/2018, 5:37 PM

## 2018-03-26 NOTE — Procedures (Signed)
History: 85 year old male being evaluated for altered mental status  Sedation: None  Technique: This is a 21 channel routine scalp EEG performed at the bedside with bipolar and monopolar montages arranged in accordance to the international 10/20 system of electrode placement. One channel was dedicated to EKG recording.    Background: The background consists of intermixed alpha and beta activities. There is a well defined posterior dominant rhythm of 8 hz that attenuates with eye opening. Sleep is recorded with normal appearing structures.   Photic stimulation: Physiologic driving is not performed  EEG Abnormalities: None  Clinical Interpretation: This normal EEG is recorded in the waking and sleep state. There was no seizure or seizure predisposition recorded on this study. Please note that lack of epileptiform activity on EEG does not preclude the possibility of epilepsy.   Roland Rack, MD [facility] [phone]  If 7pm- 7am, please page neurology on call as listed in [place].

## 2018-03-26 NOTE — Progress Notes (Signed)
EEG Completed; Results Pending  

## 2018-03-27 DIAGNOSIS — M869 Osteomyelitis, unspecified: Secondary | ICD-10-CM

## 2018-03-27 LAB — CBC WITH DIFFERENTIAL/PLATELET
Abs Immature Granulocytes: 0.39 10*3/uL — ABNORMAL HIGH (ref 0.00–0.07)
Basophils Absolute: 0.1 10*3/uL (ref 0.0–0.1)
Basophils Relative: 1 %
Eosinophils Absolute: 0.1 10*3/uL (ref 0.0–0.5)
Eosinophils Relative: 1 %
HCT: 28.9 % — ABNORMAL LOW (ref 39.0–52.0)
Hemoglobin: 9.3 g/dL — ABNORMAL LOW (ref 13.0–17.0)
Immature Granulocytes: 5 %
Lymphocytes Relative: 13 %
Lymphs Abs: 1.1 10*3/uL (ref 0.7–4.0)
MCH: 32.5 pg (ref 26.0–34.0)
MCHC: 32.2 g/dL (ref 30.0–36.0)
MCV: 101 fL — ABNORMAL HIGH (ref 80.0–100.0)
Monocytes Absolute: 0.9 10*3/uL (ref 0.1–1.0)
Monocytes Relative: 11 %
Neutro Abs: 6 10*3/uL (ref 1.7–7.7)
Neutrophils Relative %: 69 %
Platelets: 224 10*3/uL (ref 150–400)
RBC: 2.86 MIL/uL — ABNORMAL LOW (ref 4.22–5.81)
RDW: 14.6 % (ref 11.5–15.5)
WBC: 8.6 10*3/uL (ref 4.0–10.5)
nRBC: 0 % (ref 0.0–0.2)

## 2018-03-27 LAB — COMPREHENSIVE METABOLIC PANEL
ALT: 29 U/L (ref 0–44)
AST: 28 U/L (ref 15–41)
Albumin: 2.7 g/dL — ABNORMAL LOW (ref 3.5–5.0)
Alkaline Phosphatase: 52 U/L (ref 38–126)
Anion gap: 11 (ref 5–15)
BUN: 18 mg/dL (ref 8–23)
CO2: 24 mmol/L (ref 22–32)
Calcium: 8.2 mg/dL — ABNORMAL LOW (ref 8.9–10.3)
Chloride: 107 mmol/L (ref 98–111)
Creatinine, Ser: 1.26 mg/dL — ABNORMAL HIGH (ref 0.61–1.24)
GFR calc Af Amer: >60 mL/min (ref >60)
GFR calc non Af Amer: 53 mL/min — ABNORMAL LOW (ref >60)
Glucose, Bld: 78 mg/dL (ref 70–99)
Potassium: 3.7 mmol/L (ref 3.5–5.1)
Sodium: 142 mmol/L (ref 135–145)
Total Bilirubin: 0.8 mg/dL (ref 0.3–1.2)
Total Protein: 5 g/dL — ABNORMAL LOW (ref 6.5–8.1)

## 2018-03-27 LAB — VITAMIN B12: Vitamin B-12: 1321 pg/mL — ABNORMAL HIGH (ref 180–914)

## 2018-03-27 LAB — MAGNESIUM: Magnesium: 1.7 mg/dL (ref 1.7–2.4)

## 2018-03-27 LAB — FERRITIN: Ferritin: 336 ng/mL (ref 24–336)

## 2018-03-27 LAB — SURGICAL PCR SCREEN
MRSA, PCR: POSITIVE — AB
Staphylococcus aureus: POSITIVE — AB

## 2018-03-27 LAB — RETICULOCYTES
Immature Retic Fract: 22.9 % — ABNORMAL HIGH (ref 2.3–15.9)
RBC.: 2.86 MIL/uL — ABNORMAL LOW (ref 4.22–5.81)
Retic Count, Absolute: 67.8 10*3/uL (ref 19.0–186.0)
Retic Ct Pct: 2.4 % (ref 0.4–3.1)

## 2018-03-27 LAB — URINE CULTURE: Culture: NO GROWTH

## 2018-03-27 LAB — PHOSPHORUS: Phosphorus: 3.1 mg/dL (ref 2.5–4.6)

## 2018-03-27 LAB — IRON AND TIBC
Iron: 55 ug/dL (ref 45–182)
Saturation Ratios: 26 % (ref 17.9–39.5)
TIBC: 216 ug/dL — ABNORMAL LOW (ref 250–450)
UIBC: 161 ug/dL

## 2018-03-27 LAB — FOLATE: Folate: 45.8 ng/mL (ref >5.9)

## 2018-03-27 MED ORDER — VANCOMYCIN HCL 10 G IV SOLR
1250.0000 mg | INTRAVENOUS | Status: AC
  Administered 2018-03-27 – 2018-03-29 (×3): 1250 mg via INTRAVENOUS
  Filled 2018-03-27 (×4): qty 1250

## 2018-03-27 MED ORDER — MUPIROCIN 2 % EX OINT
1.0000 "application " | TOPICAL_OINTMENT | Freq: Two times a day (BID) | CUTANEOUS | Status: AC
  Administered 2018-03-27 – 2018-04-01 (×10): 1 via NASAL
  Filled 2018-03-27 (×2): qty 22

## 2018-03-27 MED ORDER — CHLORHEXIDINE GLUCONATE 4 % EX LIQD
60.0000 mL | Freq: Once | CUTANEOUS | Status: AC
  Administered 2018-03-28: 4 via TOPICAL
  Filled 2018-03-27: qty 60

## 2018-03-27 MED ORDER — CEFAZOLIN SODIUM-DEXTROSE 2-4 GM/100ML-% IV SOLN
2.0000 g | INTRAVENOUS | Status: DC
  Filled 2018-03-27 (×2): qty 100

## 2018-03-27 MED ORDER — SODIUM CHLORIDE 0.9 % IV SOLN
1.0000 g | Freq: Two times a day (BID) | INTRAVENOUS | Status: AC
  Administered 2018-03-27 – 2018-03-29 (×5): 1 g via INTRAVENOUS
  Filled 2018-03-27 (×5): qty 1

## 2018-03-27 MED ORDER — POVIDONE-IODINE 10 % EX SWAB: 2.0000 "application " | Freq: Once | CUTANEOUS | Status: DC

## 2018-03-27 MED ORDER — HEPARIN SODIUM (PORCINE) 5000 UNIT/ML IJ SOLN
5000.0000 [IU] | Freq: Three times a day (TID) | INTRAMUSCULAR | Status: DC
  Administered 2018-03-27 – 2018-03-30 (×8): 5000 [IU] via SUBCUTANEOUS
  Filled 2018-03-27 (×12): qty 1

## 2018-03-27 NOTE — Plan of Care (Signed)
Patient stable, discussed POC with patient and spouse, agreeable with plan for surgery, denies question/concerns at this time.

## 2018-03-27 NOTE — Consult Note (Signed)
[place] Nurse wound follow up Primary RN requested I come to speak with the patient's spouse.  She had many questions related to the dressings for the wounds on the RLE. I explained to Mrs. Mathison that the AquaCel Ag+ is designed to absorb drainage and combat microbes.  The dressing for these wounds on the RLE should go in DRY for best absorption.  The Prevalon heel lift boots were in the room, but had not been applied to the patient.  I requested the primary RN join [place] for the discussions.  The order for the Prevalon boots is in fact in the computer and I was able to explain further the need to prevent lateral rotation of the left leg when the patient is awake and supine by use of the boot wedge.  The boot was applied to the right foot with plans to apply one to the left foot when the patient is awake.  The existing dressing was left intact in the event that Dr. [name] will consult on him today.  The primary RN plans to change the dressings before she leaves.  I plan to add an Ace Wrap over the kerlex to assist with keeping the dressings in place.  All of Mrs. Germano's questions were answered to her expressed satisfaction, and, she knew of the osteomyelitis diagnosis. Thank you for the consult.  Discussed plan of care with the patient and bedside nurse.  [place] nurse will not follow at this time.  Please re-consult the [place] team if needed.  Val Riles, RN, MSN, CWOCN, CNS-BC, pager [phone] .

## 2018-03-27 NOTE — Progress Notes (Signed)
PROGRESS NOTE    [name]  UYQ:[id number] DOB: 1934/03/06 DOA: 03/24/2018 PCP: [name], MD   Brief Narrative: The patient is an 85 year old male with history of stroke, A. fib off anticoagulation for past 6-7 months due to lower extremity hematoma secondary to fall, seizure disorder, chronic kidney disease stage III, chronic diastolic CHF, OSA, CAD , was hospitalized from 1/4-1/8 with sepsis secondary to cellulitis of the right leg.  He has chronic right leg wound for which he was followed at the wound care center.  Patient was treated with IV vancomycin and Zosyn and Flagyl and discharged on Keflex + doxycycline. For past 2 days patient was increasingly confused and somnolent per his wife. Brought to the ED where tele-neurology was concern for subclinical seizures versus stroke.  Vitals were stable while Depakote level was subtherapeutic.  Given a loading dose of Depakote and transferred to [facility] for further work-up by neurology. CT head, CT angiogram head and neck negative for acute findings.  MRI of the brain showed punctate acute infarct in the right cerebellum.  **He undergoing full neurological work-up and obtaining a MRI of the right leg to rule out osteomyelitis which did show Marrow edema in the distal 2.4 cm of the fibula worrisome for osteomyelitis of the Fibula.  Discussed case with ID and Orthopedic Surgery. Dr. Linus Salmons of ID recommended stopping Abx and having Ortho [place] possibly Monday. I discussed the case with Dr. [name] of Orthopedic Surgery who states patient will likely require an amputation.   Assessment & Plan:   Principal Problem:   Altered mental status Active Problems:   Essential hypertension   Seizure disorder (HCC)   GERD (gastroesophageal reflux disease)   Memory difficulty   CKD (chronic kidney disease) stage 3, GFR 30-59 ml/min (HCC)   Chronic diastolic (congestive) heart failure (HCC)   Chronic atrial fibrillation   Coronary artery disease  involving native coronary artery of native heart without angina pectoris   Cellulitis   TIA (transient ischemic attack)   Encephalopathy  Acute metabolic encephalopathy/acute ischemic stroke, improved  -MRI brain showing punctate acute infarct in the right cerebellum but symptoms did not correlate and it was felt that his AMS was from [place] -Continue statin.  Switch to full dose aspirin. -Check 2D echo, Carotid Doppler, MRA head.   -Further recommendations per stroke team. -EEG recommended by neurology but patient refused but had it done yesterday -EEG was normal and did not show any seizure or seizure predisposition recordede on the study  -ECHO showed EF of 55-60% -Check HbA1c 5.7 and Lipid Panel (LDL was 35) as below  -Continue neurochecks.  Reportedly has mild memory impairment.   -SLP evaluation, PT/OT eval. -C/w ASA 325 mg po Daily and pravastatin 40 mg p.o. daily -PT/OT recommending Home Health PT for now   Essential Hypertension -Allow permissive HTN -Neurology permissive hypertension is okay if blood pressure is less than 220/120 but gradually normalize in 5 to 7 days with long-term blood pressure goal being normotensive.  Persistent right leg cellulitis with tibial ulcer -No signs of sepsis clinically.   -Wife reports that the ulceration in the lateral fibula has worsened since his hospital discharge.   -I suspect this has progressed since recent discharge as wound care consult was not asked to evaluate during his recent hospitalization. -Continued vancomycin and cefepime but after Discussion with ID, Dr. Linus Salmons recommended stopping Abx and having Ortho evaluate for Debridement and Cx   -Obtained MRI of the tibia to rule  out osteomyelitis and showed edema in the distal 2.4 cm of the fibula worrisome for osteomyelitis.  There is negative for abscess.  There is atrophy of the lower leg musculature that was presumably related to disuse of the leg -Wound care consulted and  recommendations were made with Aquacel Ag plus as well as Prevalon heel lift boots -Dr. [name] of orthopedic surgery was asked to evaluate the patient given his osteomyelitis and after my discussion with him on the phone patient may end up having amputation  NSVT -Noted for 10 runs of NSVT on the monitor yesterday morning.  Potassium normal at 3.6.  MG 1.8, will replenish.  Seizure disorder (HCC) -Subtherapeutic valproate level, received loading dose in the ED and continued valproate.  Refused EEG initially but had it done today. -Neurology was following recommended continuing current dose of regimen with Divalproex 250 mg every morning and 500 mg p.o. nightly  GERD (gastroesophageal reflux disease) -Continue PPI with Pantoprazole 40 mg po Daily   CKD (chronic kidney disease) stage 3, GFR 30-59 ml/min (HCC) -Renal function appears at baseline.  -BUN/Cr went from 21/1.17 -> 18/1.26 -Continue to Monitor and Trend Renal Function -Repeat CMP in AM   Chronic Diastolic (congestive) Heart Failure (HCC) -Euvolemic. -Was getting IVF Hydration that has now been D/C'd -C/w Metoprolol Tartrate 25 mg po BID and Furosemide 40 mg po Daily  -Cr Slightly elevated after IVF have stopped and after Furosemide has been reinitiated   Chronic Atrial Fibrillation -Rate controlled.  Not on anticoagulation due to leg hematoma in the past. -C/w Metoprolol Tartrate 25 mg po Daily     Coronary artery disease involving native coronary artery of native heart without angina pectoris -C/w ASA 325 mg po Daily, Metoprolol Tartrate 25 mg po BID -C/w Pravastatin 40 mg po Daily  HLD -Checked  Fasting lipid panel showed cholesterol level of 88, HDL 38, LDL 35, triglycerides of 75, and VLDL of 15 -C/w Ezetimibe 5 mg po qHS and Pravastatin 40 mg po Daily  Nonhealing right leg ulcer -Was seen by ?  Dr. Trula Slade in the past.   -Wife reports patient has an appointment later this month.   -Obtaining Right Leg MRI  and as above -See Above.  Hypothyroidism -Continue Levothyroxine 150 mcg po Daily   Chronic arthritis and chronic pain syndrome -Continue with Home Oxycodone and Prednisone -Continue with Lidocaine Patch  Macrocytic Anemia -Patient's hemoglobin/hematocrit went from 9.5/30.3 -> 9.3/28.9 -Need to monitor for signs and symptoms of bleeding -Checked Anemia Panel this AM and showed iron level 55, U IBC of 161, TIBC of 216, saturation ratios of 26%, with ferritin level of 336, folate level 45.8, and vitamin B12 level of 1321 -Repeat CBC in a.m.  Impaired fasting glucose/Pre-Diabetes -Hemoglobin A1c was 5.7 -Sugars on daily BMPs and CMP's have been ranging from 78-107 -Dietary education given -If blood sugars remain consistently elevated will place on sensitive NovoLog sliding scale insulin -May need Sliding Scale Coverage   DVT prophylaxis: SCDs; Heparin 5,000 units sq q8h Code Status: FULL CODE Family Communication: Discussed with wife at bedside  Disposition Plan: Pending Further workup and evaluation by Orthopedic Surgery; Home Health PT/OT at D/C  Consultants:   Neurology   Discussed the Case with ID  Orthopedic Surgery    Procedures: CT head, CT angio head and neck, MRI brain, pending MRA head,echo, MRI rt tibia  Bilateral LE Venous Duplex -Right: There is no evidence of deep vein thrombosis in the lower extremity. No cystic structure found  in the popliteal fossa. Left: There is no evidence of deep vein thrombosis in the lower extremity. No cystic structure found in the popliteal fossa.  EEG:  EEG Abnormalities: None  Clinical Interpretation: This normal EEG is recorded in the waking and sleep state. There was no seizure or seizure predisposition recorded on this study. Please note that lack of epileptiform activity on EEG does not preclude the possibility of epilepsy.    Antimicrobials:  Anti-infectives (From admission, onward)   Start     Dose/Rate Route  Frequency Ordered Stop   03/24/18 2200  ceFEPIme (MAXIPIME) 1 g in sodium chloride 0.9 % 100 mL IVPB  Status:  Discontinued     1 g 200 mL/hr over 30 Minutes Intravenous Every 12 hours 03/24/18 1921 03/27/18 0840   03/24/18 2200  vancomycin (VANCOCIN) IVPB 1000 mg/200 mL premix  Status:  Discontinued     1,000 mg 200 mL/hr over 60 Minutes Intravenous Every 24 hours 03/24/18 1929 03/27/18 0840   03/24/18 1845  vancomycin (VANCOCIN) IVPB 1000 mg/200 mL premix  Status:  Discontinued     1,000 mg 200 mL/hr over 60 Minutes Intravenous Every 12 hours 03/24/18 1832 03/24/18 1928   03/24/18 1045  ceFEPIme (MAXIPIME) 2 g in sodium chloride 0.9 % 100 mL IVPB     2 g 200 mL/hr over 30 Minutes Intravenous  Once 03/24/18 1038 03/24/18 1129   03/24/18 1045  metroNIDAZOLE (FLAGYL) IVPB 500 mg  Status:  Discontinued     500 mg 100 mL/hr over 60 Minutes Intravenous Every 8 hours 03/24/18 1038 03/24/18 1954   03/24/18 1045  vancomycin (VANCOCIN) IVPB 1000 mg/200 mL premix     1,000 mg 200 mL/hr over 60 Minutes Intravenous  Once 03/24/18 1038 03/25/18 1829     Subjective: Patient was seen and examined this morning at bedside he is sitting in the chair and was attempting to walk with physical therapy with IVR.  No chest pain, lightheadedness or dizziness but states his leg was hurting a little bit.  Discussed with him about having osteomyelitis in stated that orthopedics will come see him and he is understandable.  Wife was shocked about findings.  No nausea or vomiting.  States that he had an okay night.  No other concerns or complaints at this time.  Objective: Vitals:   03/27/18 0435 03/27/18 0437 03/27/18 0814 03/27/18 1224  BP: (!) 115/53  (!) 150/84 137/72  Pulse: 83 83 88 92  Resp:   17 16  Temp: 98.5 F (36.9 C) 98.5 F (36.9 C) 98.1 F (36.7 C) 98.2 F (36.8 C)  TempSrc: Oral Oral Oral Oral  SpO2: 95% 100% 99% 94%  Weight:      Height:        Intake/Output Summary (Last 24 hours) at  03/27/2018 1427 Last data filed at 03/27/2018 1012 Gross per 24 hour  Intake 440 ml  Output 1150 ml  Net -710 ml   Filed Weights   03/24/18 1142  Weight: 74.8 kg   Examination: Physical Exam:  Constitutional: Well-nourished, well-developed Caucasian male currently no acute distress appears slightly uncomfortable today Eyes: Lids and conjunctive are normal.  Sclera anicteric ENMT: External ears and nose appear normal.  Grossly normal hearing.  Mucous members are moist Neck: Supple with no JVD Respiratory: Diminished auscultation bilaterally no appreciable wheezing, rales, rhonchi.  Patient not tachypneic or using accessory muscles to breathe Cardiovascular: Irregularly irregular.  No appreciable murmurs, rubs, gallops.  Has mild lower extremity edema Abdomen:  Soft, nontender, nondistended.  Bowel sounds present in 4 quadrants GU: Deferred Musculoskeletal: No contractures or cyanosis.  No joint deformities noted Skin: Has a bruising and ecchymosis along with a few small skin tears on the upper extremities.  Has lower extremity ulcers on the right leg and on the lateral portion of the leg Neurologic: Nerves II through XII grossly intact no appreciable focal deficits.  Romberg sign and cerebellar reflexes were not assessed Psychiatric: Judgment and insight.  Patient is awake alert and oriented x3.  Not as anxious today.  Data Reviewed: I have personally reviewed following labs and imaging studies  CBC: Recent Labs  Lab 03/24/18 1026 03/26/18 0922 03/27/18 0549  WBC 8.6 8.9 8.6  NEUTROABS 5.5 6.8 6.0  HGB 9.6* 9.5* 9.3*  HCT 31.7* 30.3* 28.9*  MCV 107.5* 102.4* 101.0*  PLT 238 206 419   Basic Metabolic Panel: Recent Labs  Lab 03/24/18 1026 03/25/18 1247 03/26/18 0445 03/27/18 0549  NA 141 140 142 142  K 3.7 3.9 3.6 3.7  CL 106 105 109 107  CO2 24 25 25 24   GLUCOSE 163* 107* 100* 78  BUN 47* 26* 21 18  CREATININE 1.39* 1.14 1.17 1.26*  CALCIUM 8.6* 8.4* 8.1* 8.2*  MG   --  1.8  --  1.7  PHOS  --   --   --  3.1   GFR: Estimated Creatinine Clearance: 45.2 mL/min (A) (by C-G formula based on SCr of 1.26 mg/dL (H)). Liver Function Tests: Recent Labs  Lab 03/24/18 1026 03/27/18 0549  AST 43* 28  ALT 56* 29  ALKPHOS 70 52  BILITOT 0.6 0.8  PROT 5.8* 5.0*  ALBUMIN 3.2* 2.7*   No results for input(s): LIPASE, AMYLASE in the last 168 hours. No results for input(s): AMMONIA in the last 168 hours. Coagulation Profile: Recent Labs  Lab 03/24/18 1026  INR 1.16   Cardiac Enzymes: No results for input(s): CKTOTAL, CKMB, CKMBINDEX, TROPONINI in the last 168 hours. BNP (last 3 results) Recent Labs    09/30/17 1107 10/22/17 1120 11/22/17 1130  PROBNP 3,272* 3,243* 3,962*   HbA1C: Recent Labs    03/26/18 0922  HGBA1C 5.7*   CBG: Recent Labs  Lab 03/25/18 0645  GLUCAP 71   Lipid Profile: Recent Labs    03/26/18 0445  CHOL 88  HDL 38*  LDLCALC 35  TRIG 75  CHOLHDL 2.3   Thyroid Function Tests: No results for input(s): TSH, T4TOTAL, FREET4, T3FREE, THYROIDAB in the last 72 hours. Anemia Panel: Recent Labs    03/27/18 0549  VITAMINB12 1,321*  FOLATE 45.8  FERRITIN 336  TIBC 216*  IRON 55  RETICCTPCT 2.4   Sepsis Labs: Recent Labs  Lab 03/24/18 1027  LATICACIDVEN 1.00    Recent Results (from the past 240 hour(s))  Culture, blood (Routine x 2)     Status: None (Preliminary result)   Collection Time: 03/24/18 10:27 AM  Result Value Ref Range Status   Specimen Description   Final    BLOOD LEFT ANTECUBITAL Performed at [facility] [address] [place], [place] [address]    Special Requests   Final    BOTTLES DRAWN AEROBIC AND ANAEROBIC Blood Culture adequate volume Performed at [facility] [address], [place], [place] [address]    Culture   Final    NO GROWTH 3 DAYS Performed at [facility], [place] [address], [place], [place] [address]    Report Status PENDING  Incomplete  Culture, blood (Routine x 2)     Status: None (Preliminary result)   Collection Time: 03/24/18 10:28 AM  Result Value Ref Range Status   Specimen Description   Final    BLOOD RIGHT HAND Performed at [facility] [address], [place], [place] [address]    Special Requests   Final    BOTTLES DRAWN AEROBIC AND ANAEROBIC Blood Culture results may not be optimal due to an excessive volume of blood received in culture bottles Performed at [facility] [address] [place], [place] [address]    Culture   Final    NO GROWTH 3 DAYS Performed at [facility], [place] [address], [place], [place] [address]    Report Status PENDING  Incomplete  Urine Culture     Status: None   Collection Time: 03/26/18  2:51 PM  Result Value Ref Range Status   Specimen Description URINE, RANDOM  Final   Special Requests NONE  Final   Culture   Final    NO GROWTH Performed at [facility], [address], [place], [place] [address]    Report Status 03/27/2018 FINAL  Final    Radiology Studies: Mr Tibia Fibula Right Wo Contrast  Result Date: 03/26/2018 CLINICAL DATA:  Chronic wound on the right lower leg. Cellulitis. Question abscess or osteomyelitis. EXAM: MRI OF LOWER RIGHT EXTREMITY WITHOUT CONTRAST TECHNIQUE: Multiplanar, multisequence MR imaging of the right lower leg was performed. No intravenous contrast was administered. COMPARISON:  Plain films right ankle 03/13/2018. FINDINGS: Bones/Joint/Cartilage Mild marrow edema is seen in the distal 2.4 cm of the fibula worrisome for osteomyelitis. Bone marrow signal is otherwise unremarkable. No fracture or stress change. Artifact from knee replacement on the right is noted. Ligaments Negative. Muscles and Tendons Intact. There is fatty atrophy of lower leg musculature. No intramuscular fluid collection. Soft tissues Skin wound is seen just anterior to the distal fibula. No underlying abscess.  IMPRESSION: Marrow edema in the distal 2.4 cm of the fibula worrisome for osteomyelitis. Negative for abscess. Atrophy of lower leg musculature is presumably related to disuse. Electronically Signed   By: Inge Rise M.D.   On: 03/26/2018 12:36   Vas [place] Lower Extremity Venous (dvt)  Result Date: 03/26/2018  Lower Venous Study Indications: Ulcer.  Limitations: Patient movement. Performing Technologist: Abram Sander RVS  Examination Guidelines: A complete evaluation includes B-mode imaging, spectral Doppler, color Doppler, and power Doppler as needed of all accessible portions of each vessel. Bilateral testing is considered an integral part of a complete examination. Limited examinations for reoccurring indications may be performed as noted.  Right Venous Findings: +---------+---------------+---------+-----------+----------+-------+          CompressibilityPhasicitySpontaneityPropertiesSummary +---------+---------------+---------+-----------+----------+-------+ CFV      Full           Yes      Yes                          +---------+---------------+---------+-----------+----------+-------+ SFJ      Full                                                 +---------+---------------+---------+-----------+----------+-------+ FV Prox  Full                                                 +---------+---------------+---------+-----------+----------+-------+  FV Mid   Full                                                 +---------+---------------+---------+-----------+----------+-------+ FV DistalFull                                                 +---------+---------------+---------+-----------+----------+-------+ PFV      Full                                                 +---------+---------------+---------+-----------+----------+-------+ POP      Full           Yes      Yes                           +---------+---------------+---------+-----------+----------+-------+ PTV      Full                                                 +---------+---------------+---------+-----------+----------+-------+ PERO     Full                                                 +---------+---------------+---------+-----------+----------+-------+  Left Venous Findings: +---------+---------------+---------+-----------+----------+-------+          CompressibilityPhasicitySpontaneityPropertiesSummary +---------+---------------+---------+-----------+----------+-------+ CFV      Full           Yes      Yes                          +---------+---------------+---------+-----------+----------+-------+ SFJ      Full                                                 +---------+---------------+---------+-----------+----------+-------+ FV Prox  Full                                                 +---------+---------------+---------+-----------+----------+-------+ FV Mid   Full                                                 +---------+---------------+---------+-----------+----------+-------+ FV DistalFull                                                 +---------+---------------+---------+-----------+----------+-------+  PFV      Full                                                 +---------+---------------+---------+-----------+----------+-------+ POP      Full           Yes      Yes                          +---------+---------------+---------+-----------+----------+-------+ PTV      Full                                                 +---------+---------------+---------+-----------+----------+-------+ PERO     Full                                                 +---------+---------------+---------+-----------+----------+-------+    Summary: Right: There is no evidence of deep vein thrombosis in the lower extremity. No cystic structure found in the popliteal  fossa. Left: There is no evidence of deep vein thrombosis in the lower extremity. No cystic structure found in the popliteal fossa.  *See table(s) above for measurements and observations. Electronically signed by Curt Jews MD on 03/26/2018 at 8:06:40 PM.    Final    Scheduled Meds: .  stroke: mapping our early stages of recovery book   Does not apply Once  . aspirin EC  325 mg Oral Daily  . diltiazem  300 mg Oral Daily  . divalproex  250 mg Oral q morning - 10a  . divalproex  500 mg Oral QHS  . doxazosin  4 mg Oral QPM  . ezetimibe  5 mg Oral QHS  . furosemide  40 mg Oral Daily  . levothyroxine  150 mcg Oral QAC breakfast  . metoprolol tartrate  25 mg Oral BID PC  . pantoprazole  40 mg Oral Daily  . pravastatin  40 mg Oral QPM  . predniSONE  10 mg Oral Q breakfast   Continuous Infusions:   LOS: 2 days   Kerney Elbe, DO [facility] PAGER is on AMION  If 7PM-7AM, please contact night-coverage [email or web address] Password [facility] 03/27/2018, 2:27 PM

## 2018-03-27 NOTE — Progress Notes (Signed)
Physical Therapy Treatment Patient Details Name: [name] MRN: [id number] DOB: 08-12-1933 Today's Date: 03/27/2018    History of Present Illness Patient is a 85 y/o male who presents with AMS and worsening RLE infection. Admitted with encephalopathy and possible absence seizures. 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 PMH includes A-fib, CAD, CHF, gait abnormality, vertigo, seizure, cervical fusion, RLE wound, memory deficits.     PT Comments    Pt performed gait training during session this am.  He is able to progress distance but limited due to bowel incontinnence in hallway.  Post gait training transferred pt to bedside commode and left with call bell in reach as he wished to sit longer to complete BM.  Pt continues to appear close to baseline and is in agreement he can manage himself with support from his wife at home.  Pt will require HHPT at d/c.  Plan next session for continued strengthening and progression of functional mobility.   Follow Up Recommendations  Home health PT;Supervision/Assistance - 24 hour     Equipment Recommendations  None recommended by PT    Recommendations for Other Services       Precautions / Restrictions Precautions Precautions: Fall Restrictions Weight Bearing Restrictions: No    Mobility  Bed Mobility Overal bed mobility: Needs Assistance Bed Mobility: Supine to Sit     Supine to sit: Min guard     General bed mobility comments: Cues for hand placement and sequencing.  Min guard for safety.    Transfers Overall transfer level: Needs assistance Equipment used: Rolling walker (2 wheeled) Transfers: Sit to/from Stand Sit to Stand: Min assist         General transfer comment: Cues for hand placement to push from seated surface.  Pt required boosting from low seated surface.  Pt is anxious but able to achieve standing.    Ambulation/Gait Ambulation/Gait assistance: Min assist;+2 safety/equipment(close chair  follow as patient fatigues quickly.  Pt with bout of incontinence x1 during session.  ) Gait Distance (Feet): 20 Feet Assistive device: Rolling walker (2 wheeled) Gait Pattern/deviations: Trunk flexed;Step-through pattern;Decreased stride length     General Gait Details: Pt fearful of device moving away from him and did a much better job staying close to device.  Pt required cues for upper trunk control, sequencing and to increase B stride length.     Stairs             Wheelchair Mobility    Modified Rankin (Stroke Patients Only) Modified Rankin (Stroke Patients Only) Pre-Morbid Rankin Score: Moderately severe disability Modified Rankin: Moderately severe disability     Balance Overall balance assessment: Needs assistance   Sitting balance-Leahy Scale: Good       Standing balance-Leahy Scale: Poor                              Cognition Arousal/Alertness: Awake/alert Behavior During Therapy: WFL for tasks assessed/performed Overall Cognitive Status: Within Functional Limits for tasks assessed                                        Exercises      General Comments        Pertinent Vitals/Pain Pain Assessment: 0-10 Pain Score: 7  Pain Location: scrotal pain Pain Descriptors / Indicators: Discomfort Pain Intervention(s): Monitored during session;Repositioned  Home Living                      Prior Function            PT Goals (current goals can now be found in the care plan section) Acute Rehab PT Goals Patient Stated Goal: to get out of bed Potential to Achieve Goals: Fair Progress towards PT goals: Progressing toward goals    Frequency    Min 3X/week      PT Plan Current plan remains appropriate    Co-evaluation              AM-PAC PT "6 Clicks" Mobility   Outcome Measure  Help needed turning from your back to your side while in a flat bed without using bedrails?: A Little Help needed  moving from lying on your back to sitting on the side of a flat bed without using bedrails?: A Little Help needed moving to and from a bed to a chair (including a wheelchair)?: A Little Help needed standing up from a chair using your arms (e.g., wheelchair or bedside chair)?: A Little Help needed to walk in hospital room?: A Little Help needed climbing 3-5 steps with a railing? : A Little 6 Click Score: 18    End of Session Equipment Utilized During Treatment: Gait belt Activity Tolerance: Patient tolerated treatment well Patient left: in chair;with call bell/phone within reach;with chair alarm set Nurse Communication: Mobility status PT Visit Diagnosis: Muscle weakness (generalized) (M62.81);Pain Pain - Right/Left: Right Pain - part of body: Leg     Time: 4010-2725 PT Time Calculation (min) (ACUTE ONLY): 18 min  Charges:  $Gait Training: 8-22 mins                     Governor Rooks, PTA Acute Rehabilitation Services Pager [phone] Office [phone]     Malasha Kleppe Eli Hose 03/27/2018, 12:11 PM

## 2018-03-27 NOTE — Progress Notes (Signed)
Pharmacy Antibiotic Note  [name] is a 84 y.o. male admitted on 03/24/2018 with AMS, imaging concerned for stroke, transferred from [facility] to [facility] for neuro w/u. Noted pt was admitted 1/3 - 1/10 with cellulitis. Was started on vancomycin and cefepime at [facility] on 1/13, which were d/c'd initially after transfer. Now MRI of the tibia concerned for osteomyelitis, will restart vanc and cefepime for now, pending BKA. Scr = 1.26, est. crcl ~ 45 ml/min. Previously on 1g IV Q 24 hrs at [facility] received 3 doses 1/13 - 1/15 (last dose 1/15 at 0917)  Plan: Vancomycin 1250 mg IV Q 24 hrs Cefepime 1g IV Q 12 hrs F/u plans for abx after surgery Monitor renal function, check vancomycin trough at steady state if continues after surgery.  Height: 5\' 9"  (175.3 cm) Weight: 165 lb (74.8 kg) IBW/kg (Calculated) : 70.7  Temp (24hrs), Avg:98.2 F (36.8 C), Min:97.7 F (36.5 C), Max:98.5 F (36.9 C)  Recent Labs  Lab 03/24/18 1026 03/24/18 1027 03/25/18 1247 03/26/18 0445 03/26/18 0922 03/27/18 0549  WBC 8.6  --   --   --  8.9 8.6  CREATININE 1.39*  --  1.14 1.17  --  1.26*  LATICACIDVEN  --  1.00  --   --   --   --     Estimated Creatinine Clearance: 45.2 mL/min (A) (by C-G formula based on SCr of 1.26 mg/dL (H)).    Allergies  Allergen Reactions  . Eliquis [Apixaban] Other (See Comments)    bleeding  . Demerol [Meperidine] Nausea And Vomiting  . Keppra [Levetiracetam] Other (See Comments)    Causes sleepiness, mental status changes    Antimicrobials this admission: Cefepime 1/13  >>  Vancomycin 1/13 >> 1/15, 1/16 >>  Dose adjustments this admission:   Microbiology results: 1/13 BCx:  1/15 UCx:     Thank you for allowing pharmacy to be a part of this patient's care.  Maryanna Shape, PharmD, BCPS, BCPPS Clinical Pharmacist  Pager: [phone]   03/27/2018 8:09 PM

## 2018-03-28 ENCOUNTER — Encounter (HOSPITAL_COMMUNITY): Payer: Self-pay | Admitting: *Deleted

## 2018-03-28 ENCOUNTER — Ambulatory Visit: Payer: Self-pay

## 2018-03-28 ENCOUNTER — Encounter (HOSPITAL_COMMUNITY)
Admission: EM | Disposition: A | Discharge status: Skilled Nursing Facility | Payer: Self-pay | Source: Home / Self Care | Attending: Internal Medicine

## 2018-03-28 ENCOUNTER — Inpatient Hospital Stay (HOSPITAL_COMMUNITY): Payer: Medicare Other | Admitting: Certified Registered Nurse Anesthetist

## 2018-03-28 DIAGNOSIS — M86271 Subacute osteomyelitis, right ankle and foot: Secondary | ICD-10-CM

## 2018-03-28 DIAGNOSIS — G8311 Monoplegia of lower limb affecting right dominant side: Secondary | ICD-10-CM

## 2018-03-28 HISTORY — PX: LEG AMPUTATION BELOW KNEE: SHX694

## 2018-03-28 HISTORY — PX: AMPUTATION: SHX166

## 2018-03-28 LAB — COMPREHENSIVE METABOLIC PANEL
ALT: 29 U/L (ref 0–44)
AST: 28 U/L (ref 15–41)
Albumin: 2.7 g/dL — ABNORMAL LOW (ref 3.5–5.0)
Alkaline Phosphatase: 51 U/L (ref 38–126)
Anion gap: 10 (ref 5–15)
BUN: 18 mg/dL (ref 8–23)
CO2: 25 mmol/L (ref 22–32)
Calcium: 8.1 mg/dL — ABNORMAL LOW (ref 8.9–10.3)
Chloride: 107 mmol/L (ref 98–111)
Creatinine, Ser: 1.44 mg/dL — ABNORMAL HIGH (ref 0.61–1.24)
GFR calc Af Amer: 52 mL/min — ABNORMAL LOW (ref >60)
GFR calc non Af Amer: 45 mL/min — ABNORMAL LOW (ref >60)
Glucose, Bld: 81 mg/dL (ref 70–99)
Potassium: 3.7 mmol/L (ref 3.5–5.1)
Sodium: 142 mmol/L (ref 135–145)
Total Bilirubin: 0.8 mg/dL (ref 0.3–1.2)
Total Protein: 5 g/dL — ABNORMAL LOW (ref 6.5–8.1)

## 2018-03-28 LAB — CBC WITH DIFFERENTIAL/PLATELET
Abs Immature Granulocytes: 0.41 10*3/uL — ABNORMAL HIGH (ref 0.00–0.07)
Basophils Absolute: 0.1 10*3/uL (ref 0.0–0.1)
Basophils Relative: 1 %
Eosinophils Absolute: 0.1 10*3/uL (ref 0.0–0.5)
Eosinophils Relative: 1 %
HCT: 29.5 % — ABNORMAL LOW (ref 39.0–52.0)
Hemoglobin: 9.4 g/dL — ABNORMAL LOW (ref 13.0–17.0)
Immature Granulocytes: 5 %
Lymphocytes Relative: 15 %
Lymphs Abs: 1.2 10*3/uL (ref 0.7–4.0)
MCH: 32.8 pg (ref 26.0–34.0)
MCHC: 31.9 g/dL (ref 30.0–36.0)
MCV: 102.8 fL — ABNORMAL HIGH (ref 80.0–100.0)
Monocytes Absolute: 0.8 10*3/uL (ref 0.1–1.0)
Monocytes Relative: 10 %
Neutro Abs: 5.8 10*3/uL (ref 1.7–7.7)
Neutrophils Relative %: 68 %
Platelets: 209 10*3/uL (ref 150–400)
RBC: 2.87 MIL/uL — ABNORMAL LOW (ref 4.22–5.81)
RDW: 14.6 % (ref 11.5–15.5)
WBC: 8.3 10*3/uL (ref 4.0–10.5)
nRBC: 0 % (ref 0.0–0.2)

## 2018-03-28 LAB — MAGNESIUM: Magnesium: 1.9 mg/dL (ref 1.7–2.4)

## 2018-03-28 LAB — PHOSPHORUS: Phosphorus: 3.8 mg/dL (ref 2.5–4.6)

## 2018-03-28 SURGERY — AMPUTATION BELOW KNEE
Anesthesia: General | Laterality: Right

## 2018-03-28 MED ORDER — ONDANSETRON HCL 4 MG/2ML IJ SOLN: 4.0000 mg | Freq: Four times a day (QID) | INTRAMUSCULAR | Status: DC | PRN

## 2018-03-28 MED ORDER — METOCLOPRAMIDE HCL 5 MG/ML IJ SOLN: 5.0000 mg | Freq: Three times a day (TID) | INTRAMUSCULAR | Status: DC | PRN

## 2018-03-28 MED ORDER — FENTANYL CITRATE (PF) 250 MCG/5ML IJ SOLN
INTRAMUSCULAR | Status: AC
  Filled 2018-03-28: qty 5

## 2018-03-28 MED ORDER — 0.9 % SODIUM CHLORIDE (POUR BTL) OPTIME
TOPICAL | Status: DC | PRN
  Administered 2018-03-28: 1000 mL

## 2018-03-28 MED ORDER — POLYETHYLENE GLYCOL 3350 17 G PO PACK
17.0000 g | PACK | Freq: Every day | ORAL | Status: DC | PRN
  Administered 2018-03-31: 17 g via ORAL
  Filled 2018-03-28: qty 1

## 2018-03-28 MED ORDER — LIDOCAINE 2% (20 MG/ML) 5 ML SYRINGE
INTRAMUSCULAR | Status: DC | PRN
  Administered 2018-03-28: 60 mg via INTRAVENOUS

## 2018-03-28 MED ORDER — METHOCARBAMOL 500 MG PO TABS
500.0000 mg | ORAL_TABLET | Freq: Four times a day (QID) | ORAL | Status: DC | PRN
  Administered 2018-03-28 – 2018-04-02 (×7): 500 mg via ORAL
  Filled 2018-03-28 (×8): qty 1

## 2018-03-28 MED ORDER — SODIUM CHLORIDE 0.9 % IV SOLN: INTRAVENOUS | Status: DC

## 2018-03-28 MED ORDER — OXYCODONE HCL 5 MG PO TABS
ORAL_TABLET | ORAL | Status: AC
  Filled 2018-03-28: qty 2

## 2018-03-28 MED ORDER — FENTANYL CITRATE (PF) 100 MCG/2ML IJ SOLN
100.0000 ug | Freq: Once | INTRAMUSCULAR | Status: AC
  Administered 2018-03-28: 100 ug via INTRAVENOUS

## 2018-03-28 MED ORDER — BUPIVACAINE HCL (PF) 0.5 % IJ SOLN
INTRAMUSCULAR | Status: DC | PRN
  Administered 2018-03-28: 20 mL via PERINEURAL
  Administered 2018-03-28: 15 mL via PERINEURAL

## 2018-03-28 MED ORDER — PROPOFOL 10 MG/ML IV BOLUS
INTRAVENOUS | Status: DC | PRN
  Administered 2018-03-28: 110 mg via INTRAVENOUS

## 2018-03-28 MED ORDER — OXYCODONE HCL 5 MG PO TABS
5.0000 mg | ORAL_TABLET | ORAL | Status: DC | PRN
  Administered 2018-03-28: 10 mg via ORAL
  Administered 2018-03-31: 5 mg via ORAL
  Filled 2018-03-28: qty 1

## 2018-03-28 MED ORDER — FENTANYL CITRATE (PF) 100 MCG/2ML IJ SOLN: 25.0000 ug | INTRAMUSCULAR | Status: DC | PRN

## 2018-03-28 MED ORDER — SODIUM CHLORIDE 0.9 % IV SOLN
INTRAVENOUS | Status: AC
  Administered 2018-03-28: 10:00:00 via INTRAVENOUS

## 2018-03-28 MED ORDER — ONDANSETRON HCL 4 MG PO TABS: 4.0000 mg | ORAL_TABLET | Freq: Four times a day (QID) | ORAL | Status: DC | PRN

## 2018-03-28 MED ORDER — HYDROMORPHONE HCL 1 MG/ML IJ SOLN
0.5000 mg | INTRAMUSCULAR | Status: DC | PRN
  Administered 2018-03-28: 0.5 mg via INTRAVENOUS
  Administered 2018-03-29: 1 mg via INTRAVENOUS
  Administered 2018-03-29: 0.5 mg via INTRAVENOUS
  Administered 2018-03-29: 1 mg via INTRAVENOUS
  Filled 2018-03-28: qty 0.5
  Filled 2018-03-28 (×3): qty 1

## 2018-03-28 MED ORDER — FENTANYL CITRATE (PF) 100 MCG/2ML IJ SOLN
INTRAMUSCULAR | Status: AC
  Administered 2018-03-28: 100 ug via INTRAVENOUS
  Filled 2018-03-28: qty 2

## 2018-03-28 MED ORDER — PHENYLEPHRINE 40 MCG/ML (10ML) SYRINGE FOR IV PUSH (FOR BLOOD PRESSURE SUPPORT)
PREFILLED_SYRINGE | INTRAVENOUS | Status: DC | PRN
  Administered 2018-03-28 (×2): 120 ug via INTRAVENOUS

## 2018-03-28 MED ORDER — SODIUM CHLORIDE 0.9 % IV SOLN
INTRAVENOUS | Status: DC | PRN
  Administered 2018-03-28: 40 ug/min via INTRAVENOUS
  Administered 2018-03-28: 80 ug/min via INTRAVENOUS

## 2018-03-28 MED ORDER — MIDAZOLAM HCL 2 MG/2ML IJ SOLN
INTRAMUSCULAR | Status: AC
  Filled 2018-03-28: qty 2

## 2018-03-28 MED ORDER — LACTATED RINGERS IV SOLN
INTRAVENOUS | Status: DC
  Administered 2018-03-28: 12:00:00 via INTRAVENOUS

## 2018-03-28 MED ORDER — OXYCODONE HCL 5 MG PO TABS: 10.0000 mg | ORAL_TABLET | ORAL | Status: DC | PRN

## 2018-03-28 MED ORDER — CLONIDINE HCL (ANALGESIA) 100 MCG/ML EP SOLN
EPIDURAL | Status: DC | PRN
  Administered 2018-03-28 (×2): 50 ug

## 2018-03-28 MED ORDER — METOCLOPRAMIDE HCL 5 MG PO TABS: 5.0000 mg | ORAL_TABLET | Freq: Three times a day (TID) | ORAL | Status: DC | PRN

## 2018-03-28 MED ORDER — METHOCARBAMOL 1000 MG/10ML IJ SOLN
500.0000 mg | Freq: Four times a day (QID) | INTRAVENOUS | Status: DC | PRN
  Filled 2018-03-28: qty 5

## 2018-03-28 MED ORDER — MAGNESIUM CITRATE PO SOLN: 1.0000 | Freq: Once | ORAL | Status: DC | PRN

## 2018-03-28 MED ORDER — ONDANSETRON HCL 4 MG/2ML IJ SOLN
INTRAMUSCULAR | Status: DC | PRN
  Administered 2018-03-28: 4 mg via INTRAVENOUS

## 2018-03-28 MED ORDER — DOCUSATE SODIUM 100 MG PO CAPS
100.0000 mg | ORAL_CAPSULE | Freq: Two times a day (BID) | ORAL | Status: DC
  Administered 2018-03-28 – 2018-04-02 (×12): 100 mg via ORAL
  Filled 2018-03-28 (×12): qty 1

## 2018-03-28 MED ORDER — DEXAMETHASONE SODIUM PHOSPHATE 10 MG/ML IJ SOLN
INTRAMUSCULAR | Status: DC | PRN
  Administered 2018-03-28: 10 mg via INTRAVENOUS

## 2018-03-28 MED ORDER — BISACODYL 10 MG RE SUPP: 10.0000 mg | Freq: Every day | RECTAL | Status: DC | PRN

## 2018-03-28 MED ORDER — ACETAMINOPHEN 325 MG PO TABS: 325.0000 mg | ORAL_TABLET | Freq: Four times a day (QID) | ORAL | Status: DC | PRN

## 2018-03-28 SURGICAL SUPPLY — 38 items
APL SKNCLS STERI-STRIP NONHPOA (GAUZE/BANDAGES/DRESSINGS) ×4
BENZOIN TINCTURE PRP APPL 2/3 (GAUZE/BANDAGES/DRESSINGS) ×12 IMPLANT
BLADE SAW RECIP 87.9 MT (BLADE) ×3 IMPLANT
BLADE SURG 21 STRL SS (BLADE) ×3 IMPLANT
BNDG COHESIVE 6X5 TAN STRL LF (GAUZE/BANDAGES/DRESSINGS) ×6 IMPLANT
BNDG GAUZE ELAST 4 BULKY (GAUZE/BANDAGES/DRESSINGS) ×6 IMPLANT
COVER SURGICAL LIGHT HANDLE (MISCELLANEOUS) ×3 IMPLANT
COVER WAND RF STERILE (DRAPES) ×3 IMPLANT
CUFF TOURNIQUET SINGLE 34IN LL (TOURNIQUET CUFF) IMPLANT
CUFF TOURNIQUET SINGLE 44IN (TOURNIQUET CUFF) IMPLANT
DRAPE INCISE IOBAN 66X45 STRL (DRAPES) IMPLANT
DRAPE U-SHAPE 47X51 STRL (DRAPES) ×3 IMPLANT
DRESSING PREVENA PLUS CUSTOM (GAUZE/BANDAGES/DRESSINGS) ×1 IMPLANT
DRSG PREVENA PLUS CUSTOM (GAUZE/BANDAGES/DRESSINGS) ×3
DURAPREP 26ML APPLICATOR (WOUND CARE) ×3 IMPLANT
ELECT REM PT RETURN 9FT ADLT (ELECTROSURGICAL) ×3
ELECTRODE REM PT RTRN 9FT ADLT (ELECTROSURGICAL) ×1 IMPLANT
GLOVE BIOGEL PI IND STRL 9 (GLOVE) ×1 IMPLANT
GLOVE BIOGEL PI INDICATOR 9 (GLOVE) ×2
GLOVE SURG ORTHO 9.0 STRL STRW (GLOVE) ×3 IMPLANT
GOWN STRL REUS W/ TWL XL LVL3 (GOWN DISPOSABLE) ×2 IMPLANT
GOWN STRL REUS W/TWL XL LVL3 (GOWN DISPOSABLE) ×6
KIT BASIN OR (CUSTOM PROCEDURE TRAY) ×3 IMPLANT
KIT TURNOVER KIT B (KITS) ×3 IMPLANT
MANIFOLD NEPTUNE II (INSTRUMENTS) ×3 IMPLANT
NS IRRIG 1000ML POUR BTL (IV SOLUTION) ×3 IMPLANT
PACK ORTHO EXTREMITY (CUSTOM PROCEDURE TRAY) ×3 IMPLANT
PAD ARMBOARD 7.5X6 YLW CONV (MISCELLANEOUS) ×3 IMPLANT
SPONGE LAP 18X18 X RAY DECT (DISPOSABLE) IMPLANT
STAPLER VISISTAT 35W (STAPLE) IMPLANT
STOCKINETTE IMPERVIOUS LG (DRAPES) ×3 IMPLANT
SUT SILK 2 0 (SUTURE) ×3
SUT SILK 2-0 18XBRD TIE 12 (SUTURE) ×1 IMPLANT
SUT VIC AB 1 CTX 27 (SUTURE) IMPLANT
TOWEL OR 17X26 10 PK STRL BLUE (TOWEL DISPOSABLE) ×3 IMPLANT
TUBE CONNECTING 12'X1/4 (SUCTIONS) ×1
TUBE CONNECTING 12X1/4 (SUCTIONS) ×2 IMPLANT
YANKAUER SUCT BULB TIP NO VENT (SUCTIONS) ×3 IMPLANT

## 2018-03-28 NOTE — Anesthesia Preprocedure Evaluation (Addendum)
Anesthesia Evaluation  Patient identified by MRN, date of birth, ID band Patient awake    Reviewed: Allergy & Precautions, NPO status , Patient's Chart, lab work & pertinent test results  History of Anesthesia Complications (+) PROLONGED EMERGENCE  Airway Mallampati: II  TM Distance: >3 FB Neck ROM: Full    Dental no notable dental hx. (+) Teeth Intact, Dental Advisory Given   Pulmonary neg pulmonary ROS, former smoker,    Pulmonary exam normal breath sounds clear to auscultation       Cardiovascular hypertension, Pt. on medications + CAD, + Cardiac Stents and +CHF  Normal cardiovascular exam+ dysrhythmias Atrial Fibrillation  Rhythm:Irregular Rate:Normal  TTE 03/2018 - Left ventricle: The cavity size was normal. Wall thickness was increased in a pattern of moderate LVH. Systolic function was normal. The estimated ejection fraction was in the range of 55% to 60%. Left ventricular diastolic function parameters were normal. - Aortic valve: There was mild regurgitation. - Mitral valve: Calcified annulus. Mildly thickened leaflets .   There was mild regurgitation. Valve area by pressure half-time: 1.04 cm^2. - Left atrium: The atrium was mildly dilated. - Atrial septum: No defect or patent foramen ovale was identified   Neuro/Psych Seizures -, Well Controlled,  negative psych ROS   GI/Hepatic Neg liver ROS, GERD  ,  Endo/Other  Hypothyroidism   Renal/GU negative Renal ROS  negative genitourinary   Musculoskeletal  (+) Arthritis , Osteoarthritis,    Abdominal   Peds  Hematology  (+) Blood dyscrasia, anemia ,   Anesthesia Other Findings   Reproductive/Obstetrics                         Anesthesia Physical Anesthesia Plan  ASA: III  Anesthesia Plan: General and Regional   Post-op Pain Management:  Regional for Post-op pain   Induction: Intravenous  PONV Risk Score and Plan: 2 and  Ondansetron, Dexamethasone and Treatment may vary due to age or medical condition  Airway Management Planned: LMA  Additional Equipment:   Intra-op Plan:   Post-operative Plan: Extubation in OR  Informed Consent: I have reviewed the patients History and Physical, chart, labs and discussed the procedure including the risks, benefits and alternatives for the proposed anesthesia with the patient or authorized representative who has indicated his/her understanding and acceptance.     Dental advisory given  Plan Discussed with: CRNA  Anesthesia Plan Comments:        Anesthesia Quick Evaluation

## 2018-03-28 NOTE — Progress Notes (Signed)
PROGRESS NOTE    [name]  PZW:[id number] DOB: 15-Dec-1933 DOA: 03/24/2018 PCP: [name], MD   Brief Narrative: The patient is an 85 year old male with history of stroke, A. fib off anticoagulation for past 6-7 months due to lower extremity hematoma secondary to fall, seizure disorder, chronic kidney disease stage III, chronic diastolic CHF, OSA, CAD , was hospitalized from 1/4-1/8 with sepsis secondary to cellulitis of the right leg.  He has chronic right leg wound for which he was followed at the wound care center.  Patient was treated with IV vancomycin and Zosyn and Flagyl and discharged on Keflex + doxycycline. For past 2 days patient was increasingly confused and somnolent per his wife. Brought to the ED where tele-neurology was concern for subclinical seizures versus stroke.  Vitals were stable while Depakote level was subtherapeutic.  Given a loading dose of Depakote and transferred to [facility] for further work-up by neurology. CT head, CT angiogram head and neck negative for acute findings.  MRI of the brain showed punctate acute infarct in the right cerebellum.  **He undergoing full neurological work-up and obtained a MRI of the right leg to rule out osteomyelitis which did show Marrow edema in the distal 2.4 cm of the fibula worrisome for osteomyelitis of the Fibula.  Discussed case with ID and Orthopedic Surgery. Dr. Linus Salmons of ID recommended stopping Abx and having Ortho Debride possibly Monday but I discussed the case with Dr. [name] of Orthopedic Surgery who states patient will likely require an amputation and this is scheduled for today. Patient was reinitiated on Abx yesterday and will undergo a Transtibial Amputation today.    Assessment & Plan:   Principal Problem:   Altered mental status Active Problems:   Essential hypertension   Seizure disorder (HCC)   GERD (gastroesophageal reflux disease)   Memory difficulty   CKD (chronic kidney disease) stage 3, GFR 30-59  ml/min (HCC)   Chronic diastolic (congestive) heart failure (HCC)   Chronic atrial fibrillation   Coronary artery disease involving native coronary artery of native heart without angina pectoris   Cellulitis   TIA (transient ischemic attack)   Encephalopathy   Subacute osteomyelitis, right ankle and foot (HCC)   Paralysis of right lower extremity (HCC)  Acute metabolic encephalopathy/acute ischemic stroke, improved  -MRI brain showing punctate acute infarct in the right cerebellum but symptoms did not correlate and it was felt that his AMS was from [place] -Continue statin.  Switch to full dose aspirin. -Check 2D echo, Carotid Doppler, MRA head.   -Further recommendations per stroke team. -EEG recommended by neurology but patient refused but had it done yesterday -EEG was normal and did not show any seizure or seizure predisposition recordede on the study  -ECHO showed EF of 55-60% -Check HbA1c 5.7 and Lipid Panel (LDL was 35) as below  -Continue neurochecks.  Reportedly has mild memory impairment.   -SLP evaluation, PT/OT eval. -C/w ASA 325 mg po Daily and pravastatin 40 mg p.o. daily -PT/OT recommending Home Health PT for now but may change recommendations based on his Amputation   Essential Hypertension -Allow permissive HTN -Neurology permissive hypertension is okay if blood pressure is less than 220/120 but gradually normalize in 5 to 7 days with long-term blood pressure goal being normotensive. -BP was 116/77 this AM   Persistent right leg cellulitis with tibial ulcer -No signs of sepsis clinically.   -Wife reports that the ulceration in the lateral fibula has worsened since his hospital discharge.   -  I suspect this has progressed since recent discharge as wound care consult was not asked to evaluate during his recent hospitalization. -Continued vancomycin and cefepime but after Discussion with ID, Dr. Linus Salmons recommended stopping Abx and having Ortho evaluate for Debridement  and Cx; Ortho evaluated and recommeded a Transtibial Amputation so I re-initiated Abx yesterday and placed the patient back on IV Cefepime and IV Vancomcyin -Obtained MRI of the tibia to rule out osteomyelitis and showed edema in the distal 2.4 cm of the fibula worrisome for osteomyelitis.  There is negative for abscess.  There is atrophy of the lower leg musculature that was presumably related to disuse of the leg -Wound care consulted and recommendations were made with Aquacel Ag plus as well as Prevalon heel lift boots -Dr. [name] of orthopedic surgery was asked to evaluate the patient given his osteomyelitis and after my discussion with him on the phone patient may end up having amputation; This Transtibial Amputation is scheduled for today   NSVT -Noted for 10 runs of NSVT on the monitor yesterday morning.  Potassium normal at 3.6.  MG 1.8, will replenish.  Seizure disorder (HCC) -Subtherapeutic valproate level, received loading dose in the ED and continued valproate.  Refused EEG initially but had it done today. -Neurology was following recommended continuing current dose of regimen with Divalproex 250 mg every morning and 500 mg p.o. nightly  GERD (gastroesophageal reflux disease) -Continue PPI with Pantoprazole 40 mg po Daily   AKI on CKD (chronic kidney disease) stage 3, GFR 30-59 ml/min (HCC), mild -Renal function appears slightly above baseline -BUN/Cr went from 21/1.17 -> 18/1.26 -> 18/1.44 -Stopped IV Lasix and initiated IVF hydration with 75 mL/hr x 1 day as he is undergoing Surgical Amputation -Continue to Monitor and Trend Renal Function -Repeat CMP in AM   Chronic Diastolic (congestive) Heart Failure (HCC) -Euvolemic and slightly dry -Was getting IVF Hydration that has now been D/C'd but I reinitiated today at 75 mL/hr  -C/w Metoprolol Tartrate 25 mg po BID -Furosemide 40 mg po Daily  -Cr Slightly elevated after IVF have stopped and after Furosemide has been  reinitiated and worsened so will hold Furosemide today and re-evaluate in the AM -Strict I's/O's Daily Weights -Continue to Monitor Volume Status Carefully  Chronic Atrial Fibrillation -Rate controlled.  Not on anticoagulation due to leg hematoma in the past. -C/w Metoprolol Tartrate 25 mg po Daily     Coronary artery disease involving native coronary artery of native heart without angina pectoris -C/w ASA 325 mg po Daily, Metoprolol Tartrate 25 mg po BID -C/w Pravastatin 40 mg po Daily  HLD -Checked  Fasting lipid panel showed cholesterol level of 88, HDL 38, LDL 35, triglycerides of 75, and VLDL of 15 -C/w Ezetimibe 5 mg po qHS and Pravastatin 40 mg po Daily  Nonhealing right leg ulcer -Was seen by ?  Dr. Trula Slade in the past.   -Wife reports patient has an appointment later this month.   -Obtaining Right Leg MRI and as above -See Above and patient to undergo Transtibial Amputation  Hypothyroidism -Continue Levothyroxine 150 mcg po Daily   Chronic arthritis and chronic pain syndrome -Continue with Home Oxycodone and Prednisone -Continue with Lidocaine Patch  Macrocytic Anemia -Patient's hemoglobin/hematocrit went from 9.5/30.3 -> 9.3/28.9 -> 9.4/29.5 -Need to monitor for signs and symptoms of bleeding -Checked Anemia Panel this AM and showed iron level 55, U IBC of 161, TIBC of 216, saturation ratios of 26%, with ferritin level of 336, folate level 45.8,  and vitamin B12 level of 1321 -Repeat CBC in a.m.  Impaired fasting glucose/Pre-Diabetes -Hemoglobin A1c was 5.7 -Sugars on daily BMPs and CMP's have been ranging from 78-107 -Dietary education given -If blood sugars remain consistently elevated will place on sensitive NovoLog sliding scale insulin -May need Sliding Scale Coverage but will continue to Monitor closely    DVT prophylaxis: SCDs; Heparin 5,000 units sq q8h Code Status: FULL CODE Family Communication: Discussed with wife at bedside  Disposition Plan:  Pending Further workup and evaluation by Orthopedic Surgery; Home Health PT/OT at D/C but may require SNF or CIR due to anticipated Transtibial Amputation  Consultants:   Neurology   Discussed the Case with ID  Orthopedic Surgery    Procedures: CT head, CT angio head and neck, MRI brain, pending MRA head,echo, MRI rt tibia  Bilateral LE Venous Duplex -Right: There is no evidence of deep vein thrombosis in the lower extremity. No cystic structure found in the popliteal fossa. Left: There is no evidence of deep vein thrombosis in the lower extremity. No cystic structure found in the popliteal fossa.  EEG: EEG Abnormalities: None  Clinical Interpretation: This normal EEG is recorded in the waking and sleep state. There was no seizure or seizure predisposition recorded on this study. Please note that lack of epileptiform activity on EEG does not preclude the possibility of epilepsy.    Antimicrobials:  Anti-infectives (From admission, onward)   Start     Dose/Rate Route Frequency Ordered Stop   03/28/18 1200  ceFAZolin (ANCEF) IVPB 2g/100 mL premix     2 g 200 mL/hr over 30 Minutes Intravenous To ShortStay Surgical 03/27/18 1941 03/29/18 1200   03/27/18 1830  ceFEPIme (MAXIPIME) 1 g in sodium chloride 0.9 % 100 mL IVPB     1 g 200 mL/hr over 30 Minutes Intravenous Every 12 hours 03/27/18 1815     03/27/18 1830  vancomycin (VANCOCIN) 1,250 mg in sodium chloride 0.9 % 250 mL IVPB     1,250 mg 166.7 mL/hr over 90 Minutes Intravenous Every 24 hours 03/27/18 1815     03/24/18 2200  ceFEPIme (MAXIPIME) 1 g in sodium chloride 0.9 % 100 mL IVPB  Status:  Discontinued     1 g 200 mL/hr over 30 Minutes Intravenous Every 12 hours 03/24/18 1921 03/27/18 0840   03/24/18 2200  vancomycin (VANCOCIN) IVPB 1000 mg/200 mL premix  Status:  Discontinued     1,000 mg 200 mL/hr over 60 Minutes Intravenous Every 24 hours 03/24/18 1929 03/27/18 0840   03/24/18 1845  vancomycin (VANCOCIN) IVPB 1000  mg/200 mL premix  Status:  Discontinued     1,000 mg 200 mL/hr over 60 Minutes Intravenous Every 12 hours 03/24/18 1832 03/24/18 1928   03/24/18 1045  ceFEPIme (MAXIPIME) 2 g in sodium chloride 0.9 % 100 mL IVPB     2 g 200 mL/hr over 30 Minutes Intravenous  Once 03/24/18 1038 03/24/18 1129   03/24/18 1045  metroNIDAZOLE (FLAGYL) IVPB 500 mg  Status:  Discontinued     500 mg 100 mL/hr over 60 Minutes Intravenous Every 8 hours 03/24/18 1038 03/24/18 1954   03/24/18 1045  vancomycin (VANCOCIN) IVPB 1000 mg/200 mL premix     1,000 mg 200 mL/hr over 60 Minutes Intravenous  Once 03/24/18 1038 03/25/18 1829     Subjective: Patient was seen and examined this morning at bedside was doing well.  No chest pain, lightheadedness or dizziness.  No nausea or vomiting.  He is agreeable to  the transtibial amputation and it is likely to be done today.  Wife is concerned about patient returning to the floor postoperatively.  No other concern or complaints at this time  Objective: Vitals:   03/27/18 2328 03/28/18 0413 03/28/18 0755 03/28/18 0806  BP: 113/77 110/70 116/77   Pulse: 66 62  85  Resp: 18 18 17    Temp: 98 F (36.7 C) 98 F (36.7 C) 97.9 F (36.6 C)   TempSrc: Oral Oral Oral   SpO2: 98% 98%    Weight:      Height:        Intake/Output Summary (Last 24 hours) at 03/28/2018 0956 Last data filed at 03/28/2018 0600 Gross per 24 hour  Intake 650 ml  Output 1375 ml  Net -725 ml   Filed Weights   03/24/18 1142  Weight: 74.8 kg   Examination: Physical Exam:  Constitutional: Well-nourished, well-developed Caucasian male currently no acute distress appears calm and comfortable in ready to proceed with a transtibial amputation Eyes: Conjunctive are normal.  Sclera anicteric ENMT: External ears and nose appear normal.  Grossly normal hearing.  Mucous members are moist Neck: Appears supple no JVD Respiratory: Diminished to auscultation bilaterally no appreciable wheezing, rales, rhonchi.   Patient not tachypneic or using accessory muscle breathe Cardiovascular: Regularly irregular but he is rate controlled.  No appreciable murmurs, rubs, or gallops.  Mild lower extremity edema, right leg.  No appreciable edema on the left Abdomen: Soft, nontender, nondistended.  Bowel sounds present all 4 quadrants GU: Deferred Musculoskeletal: No contractures or cyanosis.  No joint trauma is noted Skin: Has bruising and ecchymosis along with a few skin tears upper extremities.  Has a lower extremity ulcer on the right leg lateral portion of the leg and ankle this is been covered and he is wearing a offloading heel boot Neurologic: Cranial nerves II through XII grossly intact with no appreciable focal deficits.  Romberg sign and cerebellar reflexes were not assessed Psychiatric: Normal judgment and insight.  Patient is awake and alert and oriented x3.  Pleasant mood and affect  Data Reviewed: I have personally reviewed following labs and imaging studies  CBC: Recent Labs  Lab 03/24/18 1026 03/26/18 0922 03/27/18 0549 03/28/18 0553  WBC 8.6 8.9 8.6 8.3  NEUTROABS 5.5 6.8 6.0 5.8  HGB 9.6* 9.5* 9.3* 9.4*  HCT 31.7* 30.3* 28.9* 29.5*  MCV 107.5* 102.4* 101.0* 102.8*  PLT 238 206 224 193   Basic Metabolic Panel: Recent Labs  Lab 03/24/18 1026 03/25/18 1247 03/26/18 0445 03/27/18 0549 03/28/18 0553  NA 141 140 142 142 142  K 3.7 3.9 3.6 3.7 3.7  CL 106 105 109 107 107  CO2 24 25 25 24 25   GLUCOSE 163* 107* 100* 78 81  BUN 47* 26* 21 18 18   CREATININE 1.39* 1.14 1.17 1.26* 1.44*  CALCIUM 8.6* 8.4* 8.1* 8.2* 8.1*  MG  --  1.8  --  1.7 1.9  PHOS  --   --   --  3.1 3.8   GFR: Estimated Creatinine Clearance: 39.6 mL/min (A) (by C-G formula based on SCr of 1.44 mg/dL (H)). Liver Function Tests: Recent Labs  Lab 03/24/18 1026 03/27/18 0549 03/28/18 0553  AST 43* 28 28  ALT 56* 29 29  ALKPHOS 70 52 51  BILITOT 0.6 0.8 0.8  PROT 5.8* 5.0* 5.0*  ALBUMIN 3.2* 2.7* 2.7*   No  results for input(s): LIPASE, AMYLASE in the last 168 hours. No results for input(s): AMMONIA in  the last 168 hours. Coagulation Profile: Recent Labs  Lab 03/24/18 1026  INR 1.16   Cardiac Enzymes: No results for input(s): CKTOTAL, CKMB, CKMBINDEX, TROPONINI in the last 168 hours. BNP (last 3 results) Recent Labs    09/30/17 1107 10/22/17 1120 11/22/17 1130  PROBNP 3,272* 3,243* 3,962*   HbA1C: Recent Labs    03/26/18 0922  HGBA1C 5.7*   CBG: Recent Labs  Lab 03/25/18 0645  GLUCAP 71   Lipid Profile: Recent Labs    03/26/18 0445  CHOL 88  HDL 38*  LDLCALC 35  TRIG 75  CHOLHDL 2.3   Thyroid Function Tests: No results for input(s): TSH, T4TOTAL, FREET4, T3FREE, THYROIDAB in the last 72 hours. Anemia Panel: Recent Labs    03/27/18 0549  VITAMINB12 1,321*  FOLATE 45.8  FERRITIN 336  TIBC 216*  IRON 55  RETICCTPCT 2.4   Sepsis Labs: Recent Labs  Lab 03/24/18 1027  LATICACIDVEN 1.00    Recent Results (from the past 240 hour(s))  Culture, blood (Routine x 2)     Status: None (Preliminary result)   Collection Time: 03/24/18 10:27 AM  Result Value Ref Range Status   Specimen Description   Final    BLOOD LEFT ANTECUBITAL Performed at [facility] [address], [place], [place] [address]    Special Requests   Final    BOTTLES DRAWN AEROBIC AND ANAEROBIC Blood Culture adequate volume Performed at [facility] [address] [place], [place] [address]    Culture   Final    NO GROWTH 4 DAYS Performed at [facility], [place] [address], [place], [place] [address]    Report Status PENDING  Incomplete  Culture, blood (Routine x 2)     Status: None (Preliminary result)   Collection Time: 03/24/18 10:28 AM  Result Value Ref Range Status   Specimen Description   Final    BLOOD RIGHT HAND Performed at [facility] [address], [place], [place] [address]    Special Requests   Final     BOTTLES DRAWN AEROBIC AND ANAEROBIC Blood Culture results may not be optimal due to an excessive volume of blood received in culture bottles Performed at [facility] [address] [place], [place] [address]    Culture   Final    NO GROWTH 4 DAYS Performed at [facility], [place] [address] [place], [place] [address]    Report Status PENDING  Incomplete  Urine Culture     Status: None   Collection Time: 03/26/18  2:51 PM  Result Value Ref Range Status   Specimen Description URINE, RANDOM  Final   Special Requests NONE  Final   Culture   Final    NO GROWTH Performed at [facility], [address] [place], [place] [address]    Report Status 03/27/2018 FINAL  Final  Surgical PCR screen     Status: Abnormal   Collection Time: 03/27/18  8:44 PM  Result Value Ref Range Status   MRSA, PCR POSITIVE (A) NEGATIVE Final    Comment: RESULT CALLED TO, READ BACK BY AND VERIFIED WITH: I.ESSIEN AKPAN,RN AT 2347 03/27/18,BY L.PITT    Staphylococcus aureus POSITIVE (A) NEGATIVE Final    Comment: (NOTE) The Xpert SA Assay (FDA approved for NASAL specimens in patients 62 years of age and older), is one component of a comprehensive surveillance program. It is not intended to diagnose infection nor to guide  or monitor treatment.     Radiology Studies: Mr Tibia Fibula Right Wo Contrast  Result Date: 03/26/2018 CLINICAL DATA:  Chronic wound on the right lower leg. Cellulitis. Question abscess or osteomyelitis. EXAM: MRI OF LOWER RIGHT EXTREMITY WITHOUT CONTRAST TECHNIQUE: Multiplanar, multisequence MR imaging of the right lower leg was performed. No intravenous contrast was administered. COMPARISON:  Plain films right ankle 03/13/2018. FINDINGS: Bones/Joint/Cartilage Mild marrow edema is seen in the distal 2.4 cm of the fibula worrisome for osteomyelitis. Bone marrow signal is otherwise unremarkable. No fracture or stress change. Artifact from knee replacement on the  right is noted. Ligaments Negative. Muscles and Tendons Intact. There is fatty atrophy of lower leg musculature. No intramuscular fluid collection. Soft tissues Skin wound is seen just anterior to the distal fibula. No underlying abscess. IMPRESSION: Marrow edema in the distal 2.4 cm of the fibula worrisome for osteomyelitis. Negative for abscess. Atrophy of lower leg musculature is presumably related to disuse. Electronically Signed   By: Inge Rise M.D.   On: 03/26/2018 12:36   Vas [place] Lower Extremity Venous (dvt)  Result Date: 03/26/2018  Lower Venous Study Indications: Ulcer.  Limitations: Patient movement. Performing Technologist: Abram Sander RVS  Examination Guidelines: A complete evaluation includes B-mode imaging, spectral Doppler, color Doppler, and power Doppler as needed of all accessible portions of each vessel. Bilateral testing is considered an integral part of a complete examination. Limited examinations for reoccurring indications may be performed as noted.  Right Venous Findings: +---------+---------------+---------+-----------+----------+-------+          CompressibilityPhasicitySpontaneityPropertiesSummary +---------+---------------+---------+-----------+----------+-------+ CFV      Full           Yes      Yes                          +---------+---------------+---------+-----------+----------+-------+ SFJ      Full                                                 +---------+---------------+---------+-----------+----------+-------+ FV Prox  Full                                                 +---------+---------------+---------+-----------+----------+-------+ FV Mid   Full                                                 +---------+---------------+---------+-----------+----------+-------+ FV DistalFull                                                 +---------+---------------+---------+-----------+----------+-------+ PFV      Full                                                  +---------+---------------+---------+-----------+----------+-------+ POP      Full  Yes      Yes                          +---------+---------------+---------+-----------+----------+-------+ PTV      Full                                                 +---------+---------------+---------+-----------+----------+-------+ PERO     Full                                                 +---------+---------------+---------+-----------+----------+-------+  Left Venous Findings: +---------+---------------+---------+-----------+----------+-------+          CompressibilityPhasicitySpontaneityPropertiesSummary +---------+---------------+---------+-----------+----------+-------+ CFV      Full           Yes      Yes                          +---------+---------------+---------+-----------+----------+-------+ SFJ      Full                                                 +---------+---------------+---------+-----------+----------+-------+ FV Prox  Full                                                 +---------+---------------+---------+-----------+----------+-------+ FV Mid   Full                                                 +---------+---------------+---------+-----------+----------+-------+ FV DistalFull                                                 +---------+---------------+---------+-----------+----------+-------+ PFV      Full                                                 +---------+---------------+---------+-----------+----------+-------+ POP      Full           Yes      Yes                          +---------+---------------+---------+-----------+----------+-------+ PTV      Full                                                 +---------+---------------+---------+-----------+----------+-------+ PERO     Full                                                  +---------+---------------+---------+-----------+----------+-------+  Summary: Right: There is no evidence of deep vein thrombosis in the lower extremity. No cystic structure found in the popliteal fossa. Left: There is no evidence of deep vein thrombosis in the lower extremity. No cystic structure found in the popliteal fossa.  *See table(s) above for measurements and observations. Electronically signed by Curt Jews MD on 03/26/2018 at 8:06:40 PM.    Final    Scheduled Meds: .  stroke: mapping our early stages of recovery book   Does not apply Once  . aspirin EC  325 mg Oral Daily  . chlorhexidine  60 mL Topical Once  . diltiazem  300 mg Oral Daily  . divalproex  250 mg Oral q morning - 10a  . divalproex  500 mg Oral QHS  . doxazosin  4 mg Oral QPM  . ezetimibe  5 mg Oral QHS  . heparin injection (subcutaneous)  5,000 Units Subcutaneous Q8H  . levothyroxine  150 mcg Oral QAC breakfast  . metoprolol tartrate  25 mg Oral BID PC  . mupirocin ointment  1 application Nasal BID  . pantoprazole  40 mg Oral Daily  . povidone-iodine  2 application Topical Once  . pravastatin  40 mg Oral QPM  . predniSONE  10 mg Oral Q breakfast   Continuous Infusions: . sodium chloride    .  ceFAZolin (ANCEF) IV    . ceFEPime (MAXIPIME) IV Stopped (03/27/18 1941)  . vancomycin Stopped (03/27/18 2148)    LOS: 3 days   Kerney Elbe, DO [facility] PAGER is on [place]  If 7PM-7AM, please contact night-coverage [email or web address] Password [facility] 03/28/2018, 9:56 AM

## 2018-03-28 NOTE — Anesthesia Postprocedure Evaluation (Signed)
Anesthesia Post Note  Patient: [name]  Procedure(s) Performed: AMPUTATION BELOW KNEE (Right )     Patient location during evaluation: PACU Anesthesia Type: General and Regional Level of consciousness: awake and alert Pain management: pain level controlled Vital Signs Assessment: post-procedure vital signs reviewed and stable Respiratory status: spontaneous breathing, nonlabored ventilation, respiratory function stable and patient connected to nasal cannula oxygen Cardiovascular status: blood pressure returned to baseline and stable Postop Assessment: no apparent nausea or vomiting Anesthetic complications: no    Last Vitals:  Vitals:   03/28/18 1432 03/28/18 1452  BP: 99/67 100/67  Pulse:  62  Resp:  17  Temp:    SpO2:  96%    Last Pain:  Vitals:   03/28/18 1432  TempSrc:   PainSc: 9                  Trygve Thal P Abigaile Rossie

## 2018-03-28 NOTE — Progress Notes (Signed)
PT Cancellation Note  Patient Details Name: [name] MRN: [id number] DOB: [birth year]   Cancelled Treatment:    Reason Eval/Treat Not Completed: (P) Patient at procedure or test/unavailable(Pt off unit for R transtibial amputation.  Will need re-eval from supervising PT next session. )   [name] 03/28/2018, 2:27 PM  [name], PTA Acute Rehabilitation Services Pager [phone] Office [phone]

## 2018-03-28 NOTE — Anesthesia Procedure Notes (Signed)
Procedure Name: LMA Insertion Date/Time: 03/28/2018 1:07 PM Performed by: Leonor Liv, CRNA Pre-anesthesia Checklist: Patient identified, Emergency Drugs available, Suction available and Patient being monitored Patient Re-evaluated:Patient Re-evaluated prior to induction Oxygen Delivery Method: Circle System Utilized Preoxygenation: Pre-oxygenation with 100% oxygen Induction Type: IV induction Ventilation: Mask ventilation without difficulty LMA: LMA inserted LMA Size: 4.0 Number of attempts: 1 Placement Confirmation: positive ETCO2 Tube secured with: Tape Dental Injury: Teeth and Oropharynx as per pre-operative assessment

## 2018-03-28 NOTE — Transfer of Care (Signed)
Immediate Anesthesia Transfer of Care Note  Patient: [name]  Procedure(s) Performed: AMPUTATION BELOW KNEE (Right )  Patient Location: PACU  Anesthesia Type:General  Level of Consciousness: awake and patient cooperative  Airway & Oxygen Therapy: Patient Spontanous Breathing and Patient connected to nasal cannula oxygen  Post-op Assessment: Report given to RN and Post -op Vital signs reviewed and stable  Post vital signs: Reviewed and stable  Last Vitals:  Vitals Value Taken Time  BP    Temp    Pulse 73 03/28/2018  1:16 PM  Resp    SpO2 97 % 03/28/2018  1:16 PM  Vitals shown include unvalidated device data.  Last Pain:  Vitals:   03/28/18 0755  TempSrc: Oral  PainSc:          Complications: No apparent anesthesia complications

## 2018-03-28 NOTE — Progress Notes (Signed)
Pt is positive for MRSA, result for MRSA nasal swap, hospitalist on call notified, Pt on contact precaution

## 2018-03-28 NOTE — Anesthesia Procedure Notes (Signed)
Anesthesia Regional Block: Popliteal block   Pre-Anesthetic Checklist: ,, timeout performed, Correct Patient, Correct Site, Correct Laterality, Correct Procedure, Correct Position, site marked, Risks and benefits discussed,  Surgical consent,  Pre-op evaluation,  At surgeon's request and post-op pain management  Laterality: Right  Prep: Maximum Sterile Barrier Precautions used, chloraprep       Needles:  Injection technique: Single-shot  Needle Type: Echogenic Stimulator Needle     Needle Length: 9cm  Needle Gauge: 22     Additional Needles:   Procedures:,,,, ultrasound used (permanent image in chart),,,,  Narrative:  Start time: 03/28/2018 11:52 AM End time: 03/28/2018 12:02 PM Injection made incrementally with aspirations every 5 mL.  Performed by: Personally  Anesthesiologist: Freddrick March, MD  Additional Notes: Monitors applied. No increased pain on injection. No increased resistance to injection. Injection made in 5cc increments. Good needle visualization. Patient tolerated procedure well.

## 2018-03-28 NOTE — Op Note (Signed)
   Date of Surgery: 03/28/2018  INDICATIONS: [name] is a 85 y.o.-year-old male who has gangrene ulceration and osteomyelitis right ankle.  PREOPERATIVE DIAGNOSIS: gangrene osteomyelitis and ulceration right ankle  POSTOPERATIVE DIAGNOSIS: Same.  PROCEDURE: Transtibial amputation Application of Prevena wound VAC  SURGEON: [name], M.D.  ANESTHESIA:  general  IV FLUIDS AND URINE: See anesthesia.  ESTIMATED BLOOD LOSS: min mL.  COMPLICATIONS: None.  DESCRIPTION OF PROCEDURE: The patient was brought to the operating room and underwent a general anesthetic. After adequate levels of anesthesia were obtained patient's lower extremity was prepped using DuraPrep draped into a sterile field. A timeout was called. The foot was draped out of the sterile field with impervious stockinette. A transverse incision was made 11 cm distal to the tibial tubercle. This curved proximally and a large posterior flap was created. The tibia was transected 1 cm proximal to the skin incision. The fibula was transected just proximal to the tibial incision. The tibia was beveled anteriorly. A large posterior flap was created. The sciatic nerve was pulled cut and allowed to retract. The vascular bundles were suture ligated with 2-0 silk. The deep and superficial fascial layers were closed using #1 Vicryl. The skin was closed using staples and 2-0 nylon. The wound was covered with a Prevena customizeable and restor wound VAC. There was a good suction fit. A prosthetic shrinker will be applied by biotech . Patient was extubated taken to the PACU in stable condition.   DISCHARGE PLANNING:  Antibiotic duration:24 hours  Weightbearing: NWB right  Pain medication: opoid pathway ordered  Dressing care/ Wound VAC:vac for 2 weeks  Discharge to: SNF or CIR  Follow-up: In the office 1 week post operative.  Robert Score, MD [place] 1:31 PM

## 2018-03-28 NOTE — Anesthesia Procedure Notes (Signed)
Anesthesia Regional Block: Femoral nerve block   Pre-Anesthetic Checklist: ,, timeout performed, Correct Patient, Correct Site, Correct Laterality, Correct Procedure, Correct Position, site marked, Risks and benefits discussed,  Surgical consent,  Pre-op evaluation,  At surgeon's request and post-op pain management  Laterality: Right  Prep: Maximum Sterile Barrier Precautions used, chloraprep       Needles:  Injection technique: Single-shot  Needle Type: Echogenic Stimulator Needle     Needle Length: 9cm  Needle Gauge: 22     Additional Needles:   Procedures:,,,, ultrasound used (permanent image in chart),,,,  Narrative:  Start time: 03/28/2018 11:41 AM End time: 03/28/2018 11:51 AM Injection made incrementally with aspirations every 5 mL.  Performed by: Personally  Anesthesiologist: Freddrick March, MD  Additional Notes: Monitors applied. No increased pain on injection. No increased resistance to injection. Injection made in 5cc increments. Good needle visualization. Patient tolerated procedure well.

## 2018-03-28 NOTE — Progress Notes (Signed)
OT Cancellation Note  Patient Details Name: [name] MRN: [id number] DOB: [birth year]   Cancelled Treatment:    Reason Eval/Treat Not Completed: Patient at procedure or test/ unavailable.   Simonne Come 03/28/2018, 1:35 PM

## 2018-03-28 NOTE — Consult Note (Signed)
ORTHOPAEDIC CONSULTATION  REQUESTING PHYSICIAN: Kerney Elbe, DO  Chief Complaint: Chronic ulcer lateral malleolus right ankle.  HPI: [name] is a 85 y.o. male who presents with partial paralysis involving the right lower extremity.  Patient states that he is unable to ambulate due to the lack of motor strength in right lower extremity.  Patient states that he has been going to the wound center for a prolonged period of time.  Patient states he has made no progress with the wound healing.  He states he has asked the care team if he needs an amputation.  Past Medical History:  Diagnosis Date  . Abnormality of gait 09/22/2015  . Arthritis   . Atrial fibrillation ([place])   . CAD (coronary artery disease)    Stent to RCA, Penta stent, 99% reduced to 0% 2002.  . Cancer ([place])    skin CA removed from back  . Chronic insomnia 03/30/2015  . Complication of anesthesia    trouble waking up  . Constipation    from medications taken  . GERD (gastroesophageal reflux disease)   . Hypercholesteremia   . Hypertension   . Hypothyroidism   . Memory difficulty 09/22/2015  . Osteoarthritis   . Seizures ([place])   . Sepsis ([place]) 05/2017  . Transient alteration of awareness 03/30/2015  . Vertigo    hx of   Past Surgical History:  Procedure Laterality Date  . BACK SURGERY    . EYE SURGERY     Bilateral Cataract surgery   . HERNIA REPAIR    . I&D EXTREMITY Right 05/10/2015   Procedure: IRRIGATION AND DEBRIDEMENT EXTREMITY;  Surgeon: Leanora Cover, MD;  Location: [facility];  Service: Orthopedics;  Laterality: Right;  . KNEE ARTHROPLASTY     right knee X 2; left knee once  . LAMINECTOMY     X 6  . POSTERIOR CERVICAL FUSION/FORAMINOTOMY  01/28/2012   Procedure: POSTERIOR CERVICAL FUSION/FORAMINOTOMY LEVEL 3;  Surgeon: [name], MD;  Location: [facility];  Service: Neurosurgery;  Laterality: Left;  Posterior Cervical Five-Thoracic One Fusion, Arthrodesis with LEFT  Cervical Seven-thoracic One Laminectomy, Foraminotomy and Resection of Synovial Cyst  . POSTERIOR CERVICAL FUSION/FORAMINOTOMY N/A 01/29/2013   Procedure: POSTERIOR CERVICAL FUSION/FORAMINOTOMY LEVEL 1 and C2-5 Posteriolateral Arthrodesis;  Surgeon: [name], MD;  Location: [facility];  Service: Neurosurgery;  Laterality: N/A;  C2-C3 Laminectomy C2-C3 posterior cervical arthrodesis  . TONSILLECTOMY     Social History   Socioeconomic History  . Marital status: Married    Spouse name: [name]  . Number of children: 3  . Years of education: 50  . Highest education level: Not on file  Occupational History  . Occupation: retired Software engineer  Social Needs  . Financial resource strain: Not on file  . Food insecurity:    Worry: Not on file    Inability: Not on file  . Transportation needs:    Medical: Not on file    Non-medical: Not on file  Tobacco Use  . Smoking status: Former Research scientist (life sciences)  . Smokeless tobacco: Never Used  Substance and Sexual Activity  . Alcohol use: Yes    Comment: rare  . Drug use: No  . Sexual activity: Not Currently  Lifestyle  . Physical activity:    Days per week: Not on file    Minutes per session: Not on file  . Stress: Not on file  Relationships  . Social connections:    Talks on phone: Not  on file    Gets together: Not on file    Attends religious service: Not on file    Active member of club or organization: Not on file    Attends meetings of clubs or organizations: Not on file    Relationship status: Not on file  Other Topics Concern  . Not on file  Social History Narrative   Lives at home w/ his wife [name]   Patient drinks 4-5 cups of coffee daily.   Patient is right handed.    Family History  Problem Relation Age of Onset  . Hypertension Mother   . Cancer Mother   . Kidney failure Father   . Heart disease Father    - negative except otherwise stated in the family history section Allergies  Allergen Reactions  . Eliquis  [Apixaban] Other (See Comments)    bleeding  . Demerol [Meperidine] Nausea And Vomiting  . Keppra [Levetiracetam] Other (See Comments)    Causes sleepiness, mental status changes   Prior to Admission medications   Medication Sig Start Date End Date Taking? Authorizing Provider  acetaminophen (TYLENOL) 325 MG tablet Take 650 mg by mouth daily as needed for moderate pain.    Yes [provider]  acetaminophen (TYLENOL) 500 MG tablet Take 500 mg by mouth daily as needed for moderate pain.   Yes [provider]  alendronate (FOSAMAX) 70 MG tablet Take 70 mg by mouth once a week. On Wednesday. Remain upright for 30-60 minutes. 01/06/12  Yes [provider]  Amino Acids-Protein Hydrolys (FEEDING SUPPLEMENT, PRO-STAT SUGAR FREE 64,) LIQD Take 30 mLs by mouth 2 (two) times daily. 07/05/17  Yes Lavina Hamman, MD  aspirin EC 81 MG tablet Take 1 tablet (81 mg total) by mouth daily. 10/15/17  Yes Barrett, Evelene Croon, PA-C  calcium carbonate (OS-CAL) 600 MG tablet Take 600 mg by mouth daily after breakfast.    Yes [provider]  cyanocobalamin 500 MCG tablet Take 500 mcg by mouth at bedtime.   Yes [provider]  diclofenac sodium (VOLTAREN) 1 % GEL Apply 2 g topically daily as needed (pain).   Yes [provider]  diltiazem (CARDIZEM CD) 300 MG 24 hr capsule TAKE 1 CAPSULE(300 MG) BY MOUTH EVERY MORNING. DO NOT CRUSH Patient taking differently: Take 300 mg by mouth daily.  12/03/17  Yes Croitoru, Mihai, MD  divalproex (DEPAKOTE) 250 MG DR tablet Take 250 mg by mouth every morning.   Yes [provider]  divalproex (DEPAKOTE) 250 MG DR tablet Take 500 mg by mouth every evening.    Yes [provider]  doxazosin (CARDURA) 4 MG tablet Take 4 mg by mouth every evening.    Yes [provider]  ferrous sulfate 325 (65 FE) MG tablet Take 1 tablet (325 mg total) by mouth 2 (two) times daily with a meal. 06/28/17  Yes Skeet Latch,  MD  fluticasone [facility]) 50 MCG/ACT nasal spray Place 2 sprays into both nostrils daily as needed for allergies or rhinitis.   Yes [provider]  furosemide (LASIX) 40 MG tablet Take 1 tablet (40 mg total) by mouth every morning. Or per cardiologist instructions since patient cannot be weighed 11/28/17  Yes Croitoru, Mihai, MD  HYDROcodone-acetaminophen (NORCO) 10-325 MG tablet Take 1 tablet by mouth every 4 (four) hours as needed for moderate pain. 03/19/18  Yes Roxan Hockey, MD  lactobacillus acidophilus & bulgar (LACTINEX) chewable tablet Chew 1 tablet by mouth 3 (three) times daily with  meals. 03/19/18  Yes Emokpae, Courage, MD  levothyroxine (SYNTHROID, LEVOTHROID) 150 MCG tablet Take 150 mcg by mouth daily before breakfast. 12/27/11  Yes [provider]  lidocaine (LIDODERM) 5 % Place 1 patch onto the skin daily as needed. Remove & Discard patch within 12 hours or as directed by MD Patient taking differently: Place 1 patch onto the skin daily as needed (pain). Remove & Discard patch within 12 hours or as directed by MD  06/01/17  Yes Mikhail, Velta Addison, DO  Menthol-Methyl Salicylate (MUSCLE RUB EX) Apply 1 application topically daily as needed (shoulder pain).   Yes [provider]  metoprolol tartrate (LOPRESSOR) 25 MG tablet Take 1 tablet (25 mg total) by mouth 2 (two) times daily. Take with or immediately following a meal. 11/06/17  Yes Croitoru, Mihai, MD  Multiple Vitamins-Minerals (CERTAVITE/ANTIOXIDANTS) TABS Take 1 tablet by mouth every evening.    Yes [provider]  omeprazole (PRILOSEC) 20 MG capsule Take 20 mg by mouth daily before breakfast.  12/23/11  Yes [provider]  potassium chloride SA (K-DUR,KLOR-CON) 20 MEQ tablet TAKE 1 TABLET BY MOUTH DAILY ALONG WITH LASIX 40MG  OR AS DIRECTED BY CARDIOLOGIST 03/11/18  Yes Croitoru, Mihai, MD  pravastatin (PRAVACHOL) 40 MG tablet Take 40 mg by mouth every evening.  10/29/14  Yes [provider]  predniSONE (DELTASONE) 5 MG tablet Take 1 tablet (5 mg total) by mouth daily with breakfast. Patient taking differently: Take 10 mg by mouth daily with breakfast.  03/19/18  Yes Emokpae, Courage, MD  sodium chloride (OCEAN) 0.65 % SOLN nasal spray Place 1 spray into both nostrils as needed for congestion.   Yes [provider]  VITAMIN D, ERGOCALCIFEROL, PO Take 1 capsule by mouth daily.    Yes [provider]  ZETIA 10 MG tablet Take 5 mg by mouth at bedtime. (0.5 tablet) 10/15/11  Yes [provider]  nitroGLYCERIN (NITROSTAT) 0.4 MG SL tablet Place 0.4 mg under the tongue every 5 (five) minutes as needed for chest pain. Max of 3 tablets    [provider]   Mr Tibia Fibula Right Wo Contrast  Result Date: 03/26/2018 CLINICAL DATA:  Chronic wound on the right lower leg. Cellulitis. Question abscess or osteomyelitis. EXAM: MRI OF LOWER RIGHT EXTREMITY WITHOUT CONTRAST TECHNIQUE: Multiplanar, multisequence MR imaging of the right lower leg was performed. No intravenous contrast was administered. COMPARISON:  Plain films right ankle 03/13/2018. FINDINGS: Bones/Joint/Cartilage Mild marrow edema is seen in the distal 2.4 cm of the fibula worrisome for osteomyelitis. Bone marrow signal is otherwise unremarkable. No fracture or stress change. Artifact from knee replacement on the right is noted. Ligaments Negative. Muscles and Tendons Intact. There is fatty atrophy of lower leg musculature. No intramuscular fluid collection. Soft tissues Skin wound is seen just anterior to the distal fibula. No underlying abscess. IMPRESSION: Marrow edema in the distal 2.4 cm of the fibula worrisome for osteomyelitis. Negative for abscess. Atrophy of lower leg musculature is presumably related to disuse. Electronically Signed   By: Inge Rise M.D.   On: 03/26/2018 12:36   Vas [place] Lower Extremity Venous (dvt)  Result Date: 03/26/2018  Lower Venous Study Indications: Ulcer.   Limitations: Patient movement. Performing Technologist: Abram Sander RVS  Examination Guidelines: A complete evaluation includes B-mode imaging, spectral Doppler, color Doppler, and power Doppler as needed of all accessible portions of each vessel. Bilateral testing is considered an integral part of a complete examination. Limited examinations for reoccurring indications may  be performed as noted.  Right Venous Findings: +---------+---------------+---------+-----------+----------+-------+          CompressibilityPhasicitySpontaneityPropertiesSummary +---------+---------------+---------+-----------+----------+-------+ CFV      Full           Yes      Yes                          +---------+---------------+---------+-----------+----------+-------+ SFJ      Full                                                 +---------+---------------+---------+-----------+----------+-------+ FV Prox  Full                                                 +---------+---------------+---------+-----------+----------+-------+ FV Mid   Full                                                 +---------+---------------+---------+-----------+----------+-------+ FV DistalFull                                                 +---------+---------------+---------+-----------+----------+-------+ PFV      Full                                                 +---------+---------------+---------+-----------+----------+-------+ POP      Full           Yes      Yes                          +---------+---------------+---------+-----------+----------+-------+ PTV      Full                                                 +---------+---------------+---------+-----------+----------+-------+ PERO     Full                                                 +---------+---------------+---------+-----------+----------+-------+  Left Venous Findings:  +---------+---------------+---------+-----------+----------+-------+          CompressibilityPhasicitySpontaneityPropertiesSummary +---------+---------------+---------+-----------+----------+-------+ CFV      Full           Yes      Yes                          +---------+---------------+---------+-----------+----------+-------+ SFJ      Full                                                 +---------+---------------+---------+-----------+----------+-------+  FV Prox  Full                                                 +---------+---------------+---------+-----------+----------+-------+ FV Mid   Full                                                 +---------+---------------+---------+-----------+----------+-------+ FV DistalFull                                                 +---------+---------------+---------+-----------+----------+-------+ PFV      Full                                                 +---------+---------------+---------+-----------+----------+-------+ POP      Full           Yes      Yes                          +---------+---------------+---------+-----------+----------+-------+ PTV      Full                                                 +---------+---------------+---------+-----------+----------+-------+ PERO     Full                                                 +---------+---------------+---------+-----------+----------+-------+    Summary: Right: There is no evidence of deep vein thrombosis in the lower extremity. No cystic structure found in the popliteal fossa. Left: There is no evidence of deep vein thrombosis in the lower extremity. No cystic structure found in the popliteal fossa.  *See table(s) above for measurements and observations. Electronically signed by Curt Jews MD on 03/26/2018 at 8:06:40 PM.    Final    - pertinent xrays, CT, MRI studies were reviewed and independently interpreted  Positive ROS: All  other systems have been reviewed and were otherwise negative with the exception of those mentioned in the HPI and as above.  Physical Exam: General: Alert, no acute distress Psychiatric: Patient is competent for consent with normal mood and affect Lymphatic: No axillary or cervical lymphadenopathy Cardiovascular: No pedal edema Respiratory: No cyanosis, no use of accessory musculature GI: No organomegaly, abdomen is soft and non-tender    Images:  @ENCIMAGES @  Labs:  Lab Results  Component Value Date   HGBA1C 5.7 (H) 03/26/2018   HGBA1C 5.0 07/12/2017   HGBA1C 5.8 (H) 07/06/2015   ESRSEDRATE 24 (H) 03/24/2018   ESRSEDRATE 15 04/18/2017   ESRSEDRATE 5 09/17/2007   CRP 1.8 (H) 03/24/2018   CRP <0.8 04/24/2017   LABURIC 6.0 03/09/2016   REPTSTATUS 03/27/2018 FINAL 03/26/2018  GRAMSTAIN  07/15/2017    WBC PRESENT,BOTH PMN AND MONONUCLEAR NO ORGANISMS SEEN Performed at [facility], [place] [address] [place], [place] [address]    CULT  03/26/2018    NO GROWTH Performed at [facility] [address], [place], [place] [address]    LABORGA ESCHERICHIA COLI (A) 09/14/2017    Lab Results  Component Value Date   ALBUMIN 2.7 (L) 03/27/2018   ALBUMIN 3.2 (L) 03/24/2018   ALBUMIN 2.6 (L) 03/19/2018   PREALBUMIN 36 (H) 11/22/2017   PREALBUMIN 28.8 04/24/2017   LABURIC 6.0 03/09/2016    Neurologic: Patient does not have protective sensation bilateral lower extremities.   MUSCULOSKELETAL:   Skin: Examination patient has a full-thickness necrotic ulcer over the lateral malleolus right ankle.  The ulcer extends down to bone this is 2 cm in diameter.  Patient has a good dorsalis pedis pulse.  He has no active dorsiflexion of the right ankle he has plantar flexion strength about 3/5.  Review of the MRI scan shows osteomyelitis of the distal fibula.  Patient is status post bilateral total knee arthroplasties with a revision of the right total knee with a long  stemmed implant in the total knee with the stem extending 100 mm distal to the tibial tubercle.  Patient does have good strength and range of motion of the right knee.  No focal weakness in the left lower extremity.  Patient does have moderate protein caloric malnutrition with an albumin of 2.7.  Assessment: Assessment: Paralysis right lower extremity with full-thickness necrotic ulcer of the lateral malleolus with osteomyelitis of the distal fibula with moderate protein caloric malnutrition.  Plan: Plan: Discussed with patient the recommendation to proceed with a transtibial amputation on the right.  With patient's good motor strength at the knee he should be able to ambulate and transfer with a prosthesis.  Discussed the risks and benefits of surgery including the risk of wound not healing.  Patient will need discharge to either skilled nursing or inpatient rehabilitation.  Discussed surgery and postoperative care with the patient and his wife.  They both state they understand and wish to proceed with surgery today.  The tibial cut should be made essentially in line with the end of the tibial implant.  Thank you for the consult and the opportunity to see Mr. Ferol Luz, [place] [phone] 6:32 AM

## 2018-03-28 NOTE — Progress Notes (Signed)
Pharmacy Antibiotic Note  [name] is a 85 y.o. male admitted on 03/24/2018 with AMS, imaging concerned for stroke, transferred from [facility] to [facility] for neuro w/u. Noted pt was admitted 1/3 - 1/10 with cellulitis. Was started on vancomycin and cefepime at [facility] on 1/13, which were d/c'd initially after transfer. Now MRI of the tibia concerned for osteomyelitis, will restart vanc and cefepime for now, pending BKA. Scr = 1.26, est. crcl ~ 45 ml/min. Previously on 1g IV Q 24 hrs at [facility] received 3 doses 1/13 - 1/15 (last dose 1/15 at 0917)   Pt is now s/p amputation. 24 hrs post op abx per Dr. [name]. Stop date added Plan: Dc vanc/cefepime 24hrs post op.  Rx signs off  Height: 5\' 9"  (175.3 cm) Weight: 165 lb (74.8 kg) IBW/kg (Calculated) : 70.7  Temp (24hrs), Avg:97.9 F (36.6 C), Min:97.3 F (36.3 C), Max:98.2 F (36.8 C)  Recent Labs  Lab 03/24/18 1026 03/24/18 1027 03/25/18 1247 03/26/18 0445 03/26/18 0922 03/27/18 0549 03/28/18 0553  WBC 8.6  --   --   --  8.9 8.6 8.3  CREATININE 1.39*  --  1.14 1.17  --  1.26* 1.44*  LATICACIDVEN  --  1.00  --   --   --   --   --     Estimated Creatinine Clearance: 39.6 mL/min (A) (by C-G formula based on SCr of 1.44 mg/dL (H)).    Allergies  Allergen Reactions  . Eliquis [Apixaban] Other (See Comments)    bleeding  . Demerol [Meperidine] Nausea And Vomiting  . Keppra [Levetiracetam] Other (See Comments)    Causes sleepiness, mental status changes    Antimicrobials this admission: Cefepime 1/13  >> 1/18 Vancomycin 1/13 >> 1/15, 1/16 >>1/18  Dose adjustments this admission:   Microbiology results: 1/13 BCx: ngtd 1/15 UCx:   neg  Onnie Boer, PharmD, BCIDP, AAHIVP, CPP Infectious Disease Pharmacist 03/28/2018 3:07 PM

## 2018-03-28 NOTE — Consult Note (Signed)
Physical Medicine and Rehabilitation Consult   Reason for Consult: LLE cellulitis s/p  BKA compounded by debility Referring Physician: Dr. [name]   HPI: [name] is a 85 y.o. male with history of CAD, CAF--off anticoagulation due to large thigh hematoma, recent admission 6/2-10/12/13  for metabolic encephalopathy and cellulitis who was readmitted on 03/25/18 with progressive MS decline with unresponsiveness and concerns of seizure. He was loaded with valproic acid and MRI brain done revealing punctate acute infarct in right cerebellum.  2D echo with EF 55-60% and mild MVR. CTA head/neck unremarkable. Dr. Erlinda Hong felt that stroke due to small vessel disease and EEG without seizure activity. MS changes felt to be due to UTI/cellulitis and recommendations to keep at home dose Depakote as well as follow up with Dr. Delice Lesch.  BLE dopplers were negative for DVT. MRI left tibia done due to chronic wound and showed marrow edema worrisome for osteomyelitis and muscle atrophy.  He was started on IV antibiotics and amputation recommended due failure of conservative therapy as well as LLE weakness. Patient underwent L-BKA on 03/28/17 by Dr. [name].  MD recommending CIR in anticipation of extensive post op rehab needs.  Hospital course further complicated by post-op pain, A. Fib with RVR.   Review of Systems  Constitutional: Negative for chills and fever.  HENT: Positive for hearing loss.   Eyes: Negative for blurred vision and double vision.  Respiratory: Negative for cough and shortness of breath.   Cardiovascular: Negative for chest pain, palpitations and leg swelling.  Gastrointestinal: Negative for heartburn.  Musculoskeletal: Positive for back pain (chronic) and joint pain.  Skin: Negative for rash.  Neurological: Positive for focal weakness (RLE weakness due to back issues). Negative for dizziness and headaches.  Psychiatric/Behavioral: Positive for memory loss (wife provided most details ). The  patient is not nervous/anxious.   All other systems reviewed and are negative.  Past Medical History:  Diagnosis Date  . Abnormality of gait 09/22/2015  . Arthritis   . Atrial fibrillation ([place])   . CAD (coronary artery disease)    Stent to RCA, Penta stent, 99% reduced to 0% 2002.  . Cancer ([place])    skin CA removed from back  . Chronic insomnia 03/30/2015  . Complication of anesthesia    trouble waking up  . Constipation    from medications taken  . GERD (gastroesophageal reflux disease)   . Hypercholesteremia   . Hypertension   . Hypothyroidism   . Memory difficulty 09/22/2015  . Osteoarthritis   . Seizures ([name])   . Sepsis ([place]) 05/2017  . Transient alteration of awareness 03/30/2015  . Vertigo    hx of    Past Surgical History:  Procedure Laterality Date  . AMPUTATION Right 03/28/2018   Procedure: AMPUTATION BELOW KNEE;  Surgeon: Newt Minion, MD;  Location: [facility];  Service: Orthopedics;  Laterality: Right;  . BACK SURGERY    . EYE SURGERY     Bilateral Cataract surgery   . HERNIA REPAIR    . I&D EXTREMITY Right 05/10/2015   Procedure: IRRIGATION AND DEBRIDEMENT EXTREMITY;  Surgeon: Leanora Cover, MD;  Location: [place];  Service: Orthopedics;  Laterality: Right;  . KNEE ARTHROPLASTY     right knee X 2; left knee once  . LAMINECTOMY     X 6  . POSTERIOR CERVICAL FUSION/FORAMINOTOMY  01/28/2012   Procedure: POSTERIOR CERVICAL FUSION/FORAMINOTOMY LEVEL 3;  Surgeon: [name], MD;  Location: [place]  ORS;  Service: Neurosurgery;  Laterality: Left;  Posterior Cervical Five-Thoracic One Fusion, Arthrodesis with LEFT Cervical Seven-thoracic One Laminectomy, Foraminotomy and Resection of Synovial Cyst  . POSTERIOR CERVICAL FUSION/FORAMINOTOMY N/A 01/29/2013   Procedure: POSTERIOR CERVICAL FUSION/FORAMINOTOMY LEVEL 1 and C2-5 Posteriolateral Arthrodesis;  Surgeon: [name], MD;  Location: [place] [facility];  Service: Neurosurgery;  Laterality: N/A;   C2-C3 Laminectomy C2-C3 posterior cervical arthrodesis  . TONSILLECTOMY      Family History  Problem Relation Age of Onset  . Hypertension Mother   . Cancer Mother   . Kidney failure Father   . Heart disease Father     Social History:  Married.  Retired Software engineer. Wife supportive and has been providing assistance with ADLs. HHPT ongoing --he was able to ambulate short distances with SBA/gait belt and RW.   Per reports that he has quit smoking. He has never used smokeless tobacco. He reports current alcohol use. He reports that he does not use drugs.    Allergies  Allergen Reactions  . Eliquis [Apixaban] Other (See Comments)    bleeding  . Demerol [Meperidine] Nausea And Vomiting  . Keppra [Levetiracetam] Other (See Comments)    Causes sleepiness, mental status changes    Medications Prior to Admission  Medication Sig Dispense Refill  . acetaminophen (TYLENOL) 325 MG tablet Take 650 mg by mouth daily as needed for moderate pain.     Marland Kitchen acetaminophen (TYLENOL) 500 MG tablet Take 500 mg by mouth daily as needed for moderate pain.    Marland Kitchen alendronate (FOSAMAX) 70 MG tablet Take 70 mg by mouth once a week. On Wednesday. Remain upright for 30-60 minutes.    . Amino Acids-Protein Hydrolys (FEEDING SUPPLEMENT, PRO-STAT SUGAR FREE 64,) LIQD Take 30 mLs by mouth 2 (two) times daily. 900 mL 0  . aspirin EC 81 MG tablet Take 1 tablet (81 mg total) by mouth daily. 90 tablet 3  . calcium carbonate (OS-CAL) 600 MG tablet Take 600 mg by mouth daily after breakfast.     . [EXPIRED] cephALEXin (KEFLEX) 500 MG capsule Take 1 capsule (500 mg total) by mouth 3 (three) times daily for 7 days. 21 capsule 0  . cyanocobalamin 500 MCG tablet Take 500 mcg by mouth at bedtime.    . diclofenac sodium (VOLTAREN) 1 % GEL Apply 2 g topically daily as needed (pain).    Marland Kitchen diltiazem (CARDIZEM CD) 300 MG 24 hr capsule TAKE 1 CAPSULE(300 MG) BY MOUTH EVERY MORNING. DO NOT CRUSH (Patient taking differently: Take 300 mg  by mouth daily. ) 90 capsule 3  . divalproex (DEPAKOTE) 250 MG DR tablet Take 250 mg by mouth every morning.    . divalproex (DEPAKOTE) 250 MG DR tablet Take 500 mg by mouth every evening.     Marland Kitchen doxazosin (CARDURA) 4 MG tablet Take 4 mg by mouth every evening.     . [EXPIRED] doxycycline (VIBRA-TABS) 100 MG tablet Take 1 tablet (100 mg total) by mouth 2 (two) times daily for 7 days. 14 tablet 0  . ferrous sulfate 325 (65 FE) MG tablet Take 1 tablet (325 mg total) by mouth 2 (two) times daily with a meal. 60 tablet 0  . fluticasone (FLONASE) 50 MCG/ACT nasal spray Place 2 sprays into both nostrils daily as needed for allergies or rhinitis.    . furosemide (LASIX) 40 MG tablet Take 1 tablet (40 mg total) by mouth every morning. Or per cardiologist instructions since patient cannot be weighed 90 tablet 1  .  HYDROcodone-acetaminophen (NORCO) 10-325 MG tablet Take 1 tablet by mouth every 4 (four) hours as needed for moderate pain. 15 tablet 0  . lactobacillus acidophilus & bulgar (LACTINEX) chewable tablet Chew 1 tablet by mouth 3 (three) times daily with meals. 90 tablet 0  . levothyroxine (SYNTHROID, LEVOTHROID) 150 MCG tablet Take 150 mcg by mouth daily before breakfast.    . lidocaine (LIDODERM) 5 % Place 1 patch onto the skin daily as needed. Remove & Discard patch within 12 hours or as directed by MD (Patient taking differently: Place 1 patch onto the skin daily as needed (pain). Remove & Discard patch within 12 hours or as directed by MD ) 30 patch 0  . Menthol-Methyl Salicylate (MUSCLE RUB EX) Apply 1 application topically daily as needed (shoulder pain).    . metoprolol tartrate (LOPRESSOR) 25 MG tablet Take 1 tablet (25 mg total) by mouth 2 (two) times daily. Take with or immediately following a meal. 180 tablet 3  . Multiple Vitamins-Minerals (CERTAVITE/ANTIOXIDANTS) TABS Take 1 tablet by mouth every evening.     Marland Kitchen omeprazole (PRILOSEC) 20 MG capsule Take 20 mg by mouth daily before breakfast.      . potassium chloride SA (K-DUR,KLOR-CON) 20 MEQ tablet TAKE 1 TABLET BY MOUTH DAILY ALONG WITH LASIX 40MG  OR AS DIRECTED BY CARDIOLOGIST 90 tablet 3  . pravastatin (PRAVACHOL) 40 MG tablet Take 40 mg by mouth every evening.   2  . predniSONE (DELTASONE) 5 MG tablet Take 1 tablet (5 mg total) by mouth daily with breakfast. (Patient taking differently: Take 10 mg by mouth daily with breakfast. ) 30 tablet 1  . sodium chloride (OCEAN) 0.65 % SOLN nasal spray Place 1 spray into both nostrils as needed for congestion.    Marland Kitchen VITAMIN D, ERGOCALCIFEROL, PO Take 1 capsule by mouth daily.     Marland Kitchen ZETIA 10 MG tablet Take 5 mg by mouth at bedtime. (0.5 tablet)    . nitroGLYCERIN (NITROSTAT) 0.4 MG SL tablet Place 0.4 mg under the tongue every 5 (five) minutes as needed for chest pain. Max of 3 tablets      Home: Home Living Family/patient expects to be discharged to:: Skilled nursing facility Living Arrangements: Spouse/significant other Available Help at Discharge: Family, Available 24 hours/day Type of Home: House Home Access: [place]: One level Bathroom Shower/Tub: Chiropodist: Handicapped height Home Equipment: Environmental consultant - 4 wheels, [employer] - single point, Bedside commode, Shower seat, Grab bars - tub/shower, Wheelchair - manual, Transport chair, Hospital bed  Lives With: Spouse  Functional History: Prior Function Level of Independence: Needs assistance Gait / Transfers Assistance Needed: Per wife, since last hospitalization pt has been transferring to w/c and not doing much walking. If he does it is short distances in the house with w/c follow and gait belt donned. ADL's / Homemaking Assistance Needed: pt reports wife assisting with 50% of bathing/dressing Comments: Has had several admissions recently. Functional Status:  Mobility: Bed Mobility Overal bed mobility: Needs Assistance Bed Mobility: Supine to Sit, Sit to Supine Supine to sit: Mod assist, +2 for  physical assistance, HOB elevated Sit to supine: Mod assist, +2 for physical assistance General bed mobility comments: multimodal cues for sequencing; assist to bring hips to EOB and elevate trunk into sitting and then to guide trunk down and bring bilat LE into bed  Transfers Overall transfer level: Needs assistance Equipment used: Rolling walker (2 wheeled) Transfers: Sit to/from Stand Sit to Stand: Mod assist, +2 physical  assistance, Max assist, From elevated surface  Lateral/Scoot Transfers: Mod assist, +2 physical assistance General transfer comment: pt stood X 3 triasl from EOB; more assistance needed last trial due to fatigue; cues for hand placement and assistance/cues for L foot positioning for improved BOS; assist to power up and for balance in standing with mutimodal cues for upright posture; pt bracing self with L LE against EOB Ambulation/Gait Ambulation/Gait assistance: Min assist, +2 safety/equipment(close chair follow as patient fatigues quickly.  Pt with bout of incontinence x1 during session.  ) Gait Distance (Feet): 20 Feet Assistive device: Rolling walker (2 wheeled) Gait Pattern/deviations: Trunk flexed, Step-through pattern, Decreased stride length General Gait Details: pt unable to hop on L LE    ADL: ADL Overall ADL's : Needs assistance/impaired Eating/Feeding: Set up, Sitting, Supervision/ safety Grooming: Sitting, Minimal assistance Upper Body Bathing: Sitting, Minimal assistance Upper Body Bathing Details (indicate cue type and reason): Min guard for sitting balance at EOB Lower Body Bathing: Maximal assistance Lower Body Bathing Details (indicate cue type and reason): Mod A +2 sit<>stand Upper Body Dressing : Minimal assistance, Sitting Lower Body Dressing: Total assistance Lower Body Dressing Details (indicate cue type and reason): Mod A +2 sit<>stand Toilet Transfer: Moderate assistance, +2 for physical assistance Toilet Transfer Details (indicate cue type  and reason): lateral scoot; attempted stand-hop/pivot with +2 but pt unable Toileting- Clothing Manipulation and Hygiene: Total assistance Toileting - Clothing Manipulation Details (indicate cue type and reason): Mod A +2 sit<>stand  Functional mobility during ADLs: Rolling walker, Min guard General ADL Comments: Pt able to obtain figure 4 position to don hospital socks.  Therapist provided min guard during sitting balance.  Min assist +2 for sit > stand and then min assist ambulating to recliner with RW  Cognition: Cognition Overall Cognitive Status: Impaired/Different from baseline Arousal/Alertness: Awake/alert Orientation Level: Oriented X4 Attention: Sustained Sustained Attention: Impaired Sustained Attention Impairment: Verbal basic(lethargy contributes) Memory: Impaired Memory Impairment: Storage deficit, Decreased short term memory Decreased Short Term Memory: Verbal basic, Functional basic(pt could not recall details of pastor visit just 10 min prio) Awareness: Impaired Problem Solving: Impaired Problem Solving Impairment: Verbal basic(slow processign) Executive Function: Organizing, Reasoning Reasoning: Impaired Reasoning Impairment: Verbal complex Organizing: Impaired Organizing Impairment: Functional basic(clock drawing impaired) Behaviors: Other (comment)(flat affect) Cognition Arousal/Alertness: Awake/alert Behavior During Therapy: WFL for tasks assessed/performed Overall Cognitive Status: Impaired/Different from baseline Area of Impairment: Following commands, Safety/judgement, Problem solving Orientation Level: Disoriented to, Situation Following Commands: Follows one step commands with increased time, Follows one step commands inconsistently Safety/Judgement: Decreased awareness of deficits, Decreased awareness of safety Problem Solving: Slow processing, Decreased initiation, Difficulty sequencing, Requires verbal cues, Requires tactile cues General Comments: Pt  able to state name, DOB including year, and oriented to hospital  Blood pressure (!) 142/83, pulse 64, temperature 97.6 F (36.4 C), temperature source Oral, resp. rate 16, height 5\' 9"  (1.753 m), weight 74.8 kg, SpO2 99 %. Physical Exam  Nursing note and vitals reviewed. Constitutional: He is oriented to person, place, and time. He appears well-developed and well-nourished.  HENT:  Head: Normocephalic and atraumatic.  Eyes: EOM are normal. Right eye exhibits no discharge. Left eye exhibits no discharge.  Neck: Normal range of motion. Neck supple.  Cardiovascular:  Irregularly irregular with RVR  Respiratory: Effort normal and breath sounds normal.  GI: Soft. Bowel sounds are normal.  Musculoskeletal:        General: Tenderness (Left BKA) present. No edema (dressing right foot).  Neurological: He is alert and oriented  to person, place, and time.  HOH Memory deficits with poor insight/awareness of deficits.  Motor: B/l UE 4/5 proximal to distal RLE: 4/5 proximal to distal LLE: HF 4/5   Skin: Skin is warm and dry.  Left BKA with VAC  Psychiatric: His speech is normal. His affect is blunt. He is slowed.    Results for orders placed or performed during the hospital encounter of 03/24/18 (from the past 24 hour(s))  CBC with Differential/Platelet     Status: Abnormal   Collection Time: 03/31/18  6:01 AM  Result Value Ref Range   WBC 8.4 4.0 - 10.5 K/uL   RBC 2.72 (L) 4.22 - 5.81 MIL/uL   Hemoglobin 8.8 (L) 13.0 - 17.0 g/dL   HCT 28.2 (L) 39.0 - 52.0 %   MCV 103.7 (H) 80.0 - 100.0 fL   MCH 32.4 26.0 - 34.0 pg   MCHC 31.2 30.0 - 36.0 g/dL   RDW 15.0 11.5 - 15.5 %   Platelets 186 150 - 400 K/uL   nRBC 0.4 (H) 0.0 - 0.2 %   Neutrophils Relative % 72 %   Neutro Abs 6.0 1.7 - 7.7 K/uL   Lymphocytes Relative 11 %   Lymphs Abs 0.9 0.7 - 4.0 K/uL   Monocytes Relative 12 %   Monocytes Absolute 1.0 0.1 - 1.0 K/uL   Eosinophils Relative 0 %   Eosinophils Absolute 0.0 0.0 - 0.5 K/uL    Basophils Relative 0 %   Basophils Absolute 0.0 0.0 - 0.1 K/uL   Immature Granulocytes 5 %   Abs Immature Granulocytes 0.44 (H) 0.00 - 0.07 K/uL  Comprehensive metabolic panel     Status: Abnormal   Collection Time: 03/31/18  6:01 AM  Result Value Ref Range   Sodium 140 135 - 145 mmol/L   Potassium 4.3 3.5 - 5.1 mmol/L   Chloride 110 98 - 111 mmol/L   CO2 22 22 - 32 mmol/L   Glucose, Bld 87 70 - 99 mg/dL   BUN 34 (H) 8 - 23 mg/dL   Creatinine, Ser 1.43 (H) 0.61 - 1.24 mg/dL   Calcium 7.3 (L) 8.9 - 10.3 mg/dL   Total Protein 4.6 (L) 6.5 - 8.1 g/dL   Albumin 2.5 (L) 3.5 - 5.0 g/dL   AST 21 15 - 41 U/L   ALT 20 0 - 44 U/L   Alkaline Phosphatase 44 38 - 126 U/L   Total Bilirubin 0.4 0.3 - 1.2 mg/dL   GFR calc non Af Amer 45 (L) >60 mL/min   GFR calc Af Amer 52 (L) >60 mL/min   Anion gap 8 5 - 15  Magnesium     Status: None   Collection Time: 03/31/18  6:01 AM  Result Value Ref Range   Magnesium 1.9 1.7 - 2.4 mg/dL  Phosphorus     Status: None   Collection Time: 03/31/18  6:01 AM  Result Value Ref Range   Phosphorus 2.6 2.5 - 4.6 mg/dL   No results found.  Assessment/Plan: Diagnosis: Right BKA.  Labs and images (see above) independently reviewed.  Records reviewed and summated above. Clean amputation daily with soap and water Monitor incision site for signs of infection or impending skin breakdown. Staples to remain in place for 3-4 weeks Stump shrinker, for edema control  Scar mobilization massaging to prevent soft tissue adherence Stump protector during therapies Prevent flexion contractures by implementing the following:   Encourage prone lying for 20-30 mins per day BID to avoid hip flexion  Contractures if medically appropriate;  Avoid pillow under knees when patient is lying in bed in order to prevent both  knee and hip flexion contractures;  Avoid prolonged sitting Post surgical pain control with oral medication Phantom limb pain control with physical modalities  including desensitization techniques (gentle self massage to the residual stump,hot packs if sensation intact, [place]) and mirror therapy, TENS. If ineffective, consider pharmacological treatment for neuropathic pain (e.g gabapentin, pregabalin, amytriptalyine, duloxetine).  When using wheelchair, patient should have knee on amputated side fully extended with board under the seat cushion. Avoid injury to contralateral side  1. Does the need for close, 24 hr/day medical supervision in concert with the patient's rehab needs make it unreasonable for this patient to be served in a less intensive setting? Yes  2. Co-Morbidities requiring supervision/potential complications: CAD, CAF (anticoagulation on hold, monitor HR with increased physical activity), tachypnea (monitor RR and O2 Sats with increased physical exertion), anemia of chronic disease (repeat labs, transfuse to ensure appropriate perfusion for increased activity tolerance), AKI on CKD (avoid nephrotoxic meds) 3. Due to safety, skin/wound care, disease management, medication administration, pain management and patient education, does the patient require 24 hr/day rehab nursing? Yes 4. Does the patient require coordinated care of a physician, rehab nurse, PT (1-2 hrs/day, 5 days/week) and OT (1-2 hrs/day, 5 days/week) to address physical and functional deficits in the context of the above medical diagnosis(es)? Yes Addressing deficits in the following areas: balance, endurance, locomotion, strength, transferring, bathing, dressing, toileting and psychosocial support 5. Can the patient actively participate in an intensive therapy program of at least 3 hrs of therapy per day at least 5 days per week? Potentially 6. The potential for patient to make measurable gains while on inpatient rehab is excellent 7. Anticipated functional outcomes upon discharge from inpatient rehab are min assist and mod assist  with PT, min assist and mod assist with OT, n/a with  SLP. 8. Estimated rehab length of stay to reach the above functional goals is: 20-24 days. 9. Anticipated D/C setting: Home 10. Anticipated post D/C treatments: SNF 11. Overall Rehab/Functional Prognosis: good  RECOMMENDATIONS: This patient's condition is appropriate for continued rehabilitative care in the following setting: Patient currently unable to tolerate 3 hours of therapy/day.  Further, he will require increased physical assistance at discharge.  Wife hopeful of going to [place].  Agree with SNF. Patient has agreed to participate in recommended program. Potentially Note that insurance prior authorization may be required for reimbursement for recommended care.  Comment: Rehab Admissions Coordinator to follow up.   I have personally performed a face to face diagnostic evaluation, including, but not limited to relevant history and physical exam findings, of this patient and developed relevant assessment and plan.  Additionally, I have reviewed and concur with the physician assistant's documentation above.   Delice Lesch, MD, ABPMR Bary Leriche, PA-C 03/31/2018

## 2018-03-28 NOTE — Progress Notes (Signed)
Orthopedic Tech Progress Note Patient Details:  [name] [birth year] [id number]  Patient ID: [name], male   DOB: April 10, 1933, 85 y.o.   MRN: [id number] Called order in to [place] 03/28/2018, 3:19 PM

## 2018-03-29 DIAGNOSIS — N189 Chronic kidney disease, unspecified: Secondary | ICD-10-CM

## 2018-03-29 LAB — BASIC METABOLIC PANEL
Anion gap: 10 (ref 5–15)
BUN: 32 mg/dL — ABNORMAL HIGH (ref 8–23)
CO2: 22 mmol/L (ref 22–32)
Calcium: 7.5 mg/dL — ABNORMAL LOW (ref 8.9–10.3)
Chloride: 105 mmol/L (ref 98–111)
Creatinine, Ser: 1.87 mg/dL — ABNORMAL HIGH (ref 0.61–1.24)
GFR calc Af Amer: 38 mL/min — ABNORMAL LOW (ref >60)
GFR calc non Af Amer: 33 mL/min — ABNORMAL LOW (ref >60)
Glucose, Bld: 160 mg/dL — ABNORMAL HIGH (ref 70–99)
Potassium: 4.6 mmol/L (ref 3.5–5.1)
Sodium: 137 mmol/L (ref 135–145)

## 2018-03-29 LAB — CBC WITH DIFFERENTIAL/PLATELET
Abs Immature Granulocytes: 0.28 10*3/uL — ABNORMAL HIGH (ref 0.00–0.07)
Basophils Absolute: 0 10*3/uL (ref 0.0–0.1)
Basophils Relative: 0 %
Eosinophils Absolute: 0 10*3/uL (ref 0.0–0.5)
Eosinophils Relative: 0 %
HCT: 27.9 % — ABNORMAL LOW (ref 39.0–52.0)
Hemoglobin: 8.9 g/dL — ABNORMAL LOW (ref 13.0–17.0)
Immature Granulocytes: 3 %
Lymphocytes Relative: 4 %
Lymphs Abs: 0.4 10*3/uL — ABNORMAL LOW (ref 0.7–4.0)
MCH: 32.6 pg (ref 26.0–34.0)
MCHC: 31.9 g/dL (ref 30.0–36.0)
MCV: 102.2 fL — ABNORMAL HIGH (ref 80.0–100.0)
Monocytes Absolute: 0.3 10*3/uL (ref 0.1–1.0)
Monocytes Relative: 3 %
Neutro Abs: 8.9 10*3/uL — ABNORMAL HIGH (ref 1.7–7.7)
Neutrophils Relative %: 90 %
Platelets: 192 10*3/uL (ref 150–400)
RBC: 2.73 MIL/uL — ABNORMAL LOW (ref 4.22–5.81)
RDW: 14.7 % (ref 11.5–15.5)
WBC: 9.8 10*3/uL (ref 4.0–10.5)
nRBC: 0 % (ref 0.0–0.2)

## 2018-03-29 LAB — CULTURE, BLOOD (ROUTINE X 2)
Culture: NO GROWTH
Culture: NO GROWTH
Special Requests: ADEQUATE

## 2018-03-29 LAB — PHOSPHORUS: Phosphorus: 4.3 mg/dL (ref 2.5–4.6)

## 2018-03-29 LAB — COMPREHENSIVE METABOLIC PANEL
ALT: 25 U/L (ref 0–44)
AST: 23 U/L (ref 15–41)
Albumin: 2.8 g/dL — ABNORMAL LOW (ref 3.5–5.0)
Alkaline Phosphatase: 52 U/L (ref 38–126)
Anion gap: 12 (ref 5–15)
BUN: 27 mg/dL — ABNORMAL HIGH (ref 8–23)
CO2: 23 mmol/L (ref 22–32)
Calcium: 7.7 mg/dL — ABNORMAL LOW (ref 8.9–10.3)
Chloride: 105 mmol/L (ref 98–111)
Creatinine, Ser: 1.68 mg/dL — ABNORMAL HIGH (ref 0.61–1.24)
GFR calc Af Amer: 43 mL/min — ABNORMAL LOW (ref >60)
GFR calc non Af Amer: 37 mL/min — ABNORMAL LOW (ref >60)
Glucose, Bld: 122 mg/dL — ABNORMAL HIGH (ref 70–99)
Potassium: 4.2 mmol/L (ref 3.5–5.1)
Sodium: 140 mmol/L (ref 135–145)
Total Bilirubin: 0.9 mg/dL (ref 0.3–1.2)
Total Protein: 5.3 g/dL — ABNORMAL LOW (ref 6.5–8.1)

## 2018-03-29 LAB — MAGNESIUM: Magnesium: 1.8 mg/dL (ref 1.7–2.4)

## 2018-03-29 MED ORDER — POLYETHYLENE GLYCOL 3350 17 G PO PACK
17.0000 g | PACK | Freq: Two times a day (BID) | ORAL | Status: DC
  Administered 2018-03-29 – 2018-04-02 (×6): 17 g via ORAL
  Filled 2018-03-29 (×8): qty 1

## 2018-03-29 MED ORDER — SODIUM CHLORIDE 0.9 % IV SOLN
INTRAVENOUS | Status: DC
  Administered 2018-03-29 – 2018-03-30 (×2): via INTRAVENOUS

## 2018-03-29 MED ORDER — SODIUM CHLORIDE 0.9 % IV SOLN: INTRAVENOUS | Status: DC

## 2018-03-29 MED ORDER — SENNOSIDES-DOCUSATE SODIUM 8.6-50 MG PO TABS
1.0000 | ORAL_TABLET | Freq: Two times a day (BID) | ORAL | Status: DC
  Administered 2018-03-29 – 2018-04-02 (×10): 1 via ORAL
  Filled 2018-03-29 (×10): qty 1

## 2018-03-29 MED ORDER — SODIUM CHLORIDE 0.9 % IV BOLUS
500.0000 mL | Freq: Once | INTRAVENOUS | Status: AC
  Administered 2018-03-29: 500 mL via INTRAVENOUS

## 2018-03-29 NOTE — Progress Notes (Signed)
Occupational Therapy Re-eval Patient Details Name: [name] MRN: [id number] DOB: 12-Jul-1933 Today's Date: 03/29/2018    History of present illness Patient is a 85 y/o male who presents with AMS and worsening RLE infection. Admitted with encephalopathy and possible absence seizures. 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  1/17 R transtibial amputation by Dr. [name].  PMH includes A-fib, CAD, CHF, gait abnormality, vertigo, seizure, cervical fusion, RLE wound, memory deficits.    OT comments  [name] admitted initially with RLE infection and now s/p RLE transtibial amputation presents to acute OT with decreased sitting balance, decreased standing balance, decreased ability to transfer, sometimes needing more than one cue for what we wanted him to do--all affecting his ability to be independent and safe with basic ADLs. He will benefit from acute OT with follow up on CIR to get to a W/C S level to go home with wife.  Follow Up Recommendations  CIR;Supervision/Assistance - 24 hour    Equipment Recommendations  Other (comment)(TBD next venue)       Precautions / Restrictions Precautions Precautions: Fall Precaution Comments: R transtibial amputation with residual limb protecter Restrictions Weight Bearing Restrictions: No       Mobility Bed Mobility Overal bed mobility: Needs Assistance Bed Mobility: Supine to Sit     Supine to sit: Min assist     General bed mobility comments: increased time and effort, cueing for technique and sequencing, assist with trunk elevation  Transfers Overall transfer level: Needs assistance Equipment used: Rolling walker (2 wheeled) Transfers: Sit to/from Stand;Lateral/Scoot Transfers Sit to Stand: Mod assist;+2 physical assistance        Lateral/Scoot Transfers: Mod assist;+2 physical assistance General transfer comment: increased time and effort, cueing for safe hand  placement; pt performed sit<>stand from EOB x2 with mod A x2, attempted pivot to chair but pt unable to perform. Pt then laterally scooted to recliner chair from EOB towards his R side with mod A x2    Balance Overall balance assessment: Needs assistance Sitting-balance support: No upper extremity supported(left foot on floor) Sitting balance-Leahy Scale: Fair     Standing balance support: Bilateral upper extremity supported Standing balance-Leahy Scale: Poor Standing balance comment: reliant on RW and additional external support of 2                            ADL either performed or assessed with clinical judgement   ADL Overall ADL's : Needs assistance/impaired Eating/Feeding: Set up;Sitting;Supervision/ safety   Grooming: Sitting;Minimal assistance   Upper Body Bathing: Sitting;Minimal assistance   Lower Body Bathing: Maximal assistance Lower Body Bathing Details (indicate cue type and reason): Mod A +2 sit<>stand Upper Body Dressing : Minimal assistance;Sitting   Lower Body Dressing: Total assistance Lower Body Dressing Details (indicate cue type and reason): Mod A +2 sit<>stand Toilet Transfer: Moderate assistance;+2 for physical assistance Toilet Transfer Details (indicate cue type and reason): lateral scoot; attempted stand-hop/pivot with +2 but pt unable Toileting- Clothing Manipulation and Hygiene: Total assistance Toileting - Clothing Manipulation Details (indicate cue type and reason): Mod A +2 sit<>stand              Vision Baseline Vision/History: Wears glasses Wears Glasses: At all times Patient Visual Report: No change from baseline            Cognition Arousal/Alertness: Awake/alert Behavior During Therapy: WFL for tasks assessed/performed Overall  Cognitive Status: Impaired/Different from baseline Area of Impairment: Following commands;Safety/judgement;Problem solving                       Following Commands: Follows one step  commands with increased time Safety/Judgement: Decreased awareness of deficits;Decreased awareness of safety   Problem Solving: Slow processing;Decreased initiation;Difficulty sequencing;Requires verbal cues;Requires tactile cues                     Pertinent Vitals/ Pain       Pain Assessment: Faces Faces Pain Scale: No hurt         Frequency  Min 2X/week        Progress Toward Goals  OT Goals(current goals can now be found in the care plan section)  Progress towards OT goals: Not progressing toward goals - comment(due to new sx since initial eval)     Plan Discharge plan needs to be updated    Co-evaluation    PT/OT/SLP Co-Evaluation/Treatment: Yes Reason for Co-Treatment: For patient/therapist safety;To address functional/ADL transfers   OT goals addressed during session: Strengthening/ROM      AM-PAC OT "6 Clicks" Daily Activity     Outcome Measure   Help from another person eating meals?: A Little Help from another person taking care of personal grooming?: A Little Help from another person toileting, which includes using toliet, bedpan, or urinal?: Total Help from another person bathing (including washing, rinsing, drying)?: A Lot Help from another person to put on and taking off regular upper body clothing?: A Little Help from another person to put on and taking off regular lower body clothing?: Total 6 Click Score: 13    End of Session Equipment Utilized During Treatment: Gait belt;Rolling walker  OT Visit Diagnosis: Unsteadiness on feet (R26.81);Other abnormalities of gait and mobility (R26.89);Muscle weakness (generalized) (M62.81)   Activity Tolerance Patient tolerated treatment well   Patient Left in chair;with call bell/phone within reach;with chair alarm set   Nurse Communication Mobility status;Need for lift equipment(sara stedy)        Time: 1740-8144 OT Time Calculation (min): 42 min  Charges: OT General Charges $OT Visit: 1  Visit OT Evaluation $OT Re-eval: 1 Re-eval OT Treatments $Self Care/Home Management : 8-22 mins  Golden Circle, OTR/L Acute [employer] Pager [phone] Office [phone]      Almon Register 03/29/2018, 8:59 PM

## 2018-03-29 NOTE — Progress Notes (Signed)
Physical Therapy Treatment Patient Details Name: [name] MRN: [id number] DOB: [birth year] Today's Date: 03/29/2018    History of Present Illness Patient is a 85 y/o male who presents with AMS and worsening RLE infection. Admitted with encephalopathy and possible absence seizures. 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  1/17 R transtibial amputation by Dr. [name].  PMH includes A-fib, CAD, CHF, gait abnormality, vertigo, seizure, cervical fusion, RLE wound, memory deficits.     PT Comments    Pt seen for re-evaluation following his R transtibial amputation on 1/17. He is now much more limited than prior to surgery and requires mod A x2 for transfers. Pt's spouse present throughout session as well, very attentive and engaged in session; however, she realizes pt would be too much for her to handle at home at this time point and is interested in pursuing rehab. Pt is an excellent candidate for CIR to maximize his functional mobility at w/c level prior to returning home with family support. Pt would continue to benefit from skilled physical therapy services at this time while admitted and after d/c to address the below listed limitations in order to improve overall safety and independence with functional mobility.    Follow Up Recommendations  CIR     Equipment Recommendations  None recommended by PT    Recommendations for Other Services Rehab consult     Precautions / Restrictions Precautions Precautions: Fall Precaution Comments: R transtibial amputation with residual limb protecter Restrictions Weight Bearing Restrictions: No    Mobility  Bed Mobility Overal bed mobility: Needs Assistance Bed Mobility: Supine to Sit     Supine to sit: Min assist     General bed mobility comments: increased time and effort, cueing for technique and sequencing, assist with trunk elevation  Transfers Overall transfer level:  Needs assistance Equipment used: Rolling walker (2 wheeled) Transfers: Sit to/from Stand;Lateral/Scoot Transfers Sit to Stand: Mod assist;+2 physical assistance        Lateral/Scoot Transfers: Mod assist;+2 physical assistance General transfer comment: increased time and effort, cueing for safe hand placement; pt performed sit<>stand from EOB x2 with mod A x2, attempted pivot to chair but pt unable to perform. Pt then laterally scooted to recliner chair from EOB towards his R side with mod A x2  Ambulation/Gait             General Gait Details: pt unable to hop on L LE   Stairs             Wheelchair Mobility    Modified Rankin (Stroke Patients Only) Modified Rankin (Stroke Patients Only) Pre-Morbid Rankin Score: Moderately severe disability Modified Rankin: Moderately severe disability     Balance Overall balance assessment: Needs assistance   Sitting balance-Leahy Scale: Fair     Standing balance support: Bilateral upper extremity supported Standing balance-Leahy Scale: Poor                              Cognition Arousal/Alertness: Awake/alert Behavior During Therapy: WFL for tasks assessed/performed Overall Cognitive Status: Impaired/Different from baseline Area of Impairment: Following commands;Safety/judgement;Problem solving                       Following Commands: Follows one step commands with increased time Safety/Judgement: Decreased awareness of deficits;Decreased awareness of safety   Problem Solving: Slow processing;Decreased initiation;Difficulty sequencing;Requires verbal cues;Requires tactile cues  Exercises      General Comments        Pertinent Vitals/Pain Pain Assessment: Faces Faces Pain Scale: No hurt    Home Living                      Prior Function            PT Goals (current goals can now be found in the care plan section) Acute Rehab PT Goals PT Goal Formulation: With  patient Time For Goal Achievement: 04/08/18 Potential to Achieve Goals: Good Progress towards PT goals: Progressing toward goals    Frequency    Min 5X/week      PT Plan Discharge plan needs to be updated;Frequency needs to be updated    Co-evaluation PT/OT/SLP Co-Evaluation/Treatment: Yes Reason for Co-Treatment: For patient/therapist safety;To address functional/ADL transfers          AM-PAC PT "6 Clicks" Mobility   Outcome Measure  Help needed turning from your back to your side while in a flat bed without using bedrails?: A Little Help needed moving from lying on your back to sitting on the side of a flat bed without using bedrails?: A Little Help needed moving to and from a bed to a chair (including a wheelchair)?: A Lot Help needed standing up from a chair using your arms (e.g., wheelchair or bedside chair)?: A Lot Help needed to walk in hospital room?: Total Help needed climbing 3-5 steps with a railing? : Total 6 Click Score: 12    End of Session Equipment Utilized During Treatment: Gait belt Activity Tolerance: Patient tolerated treatment well Patient left: in chair;with call bell/phone within reach;with chair alarm set;with family/visitor present Nurse Communication: Mobility status PT Visit Diagnosis: Other abnormalities of gait and mobility (R26.89);Muscle weakness (generalized) (M62.81);Pain Pain - Right/Left: Right Pain - part of body: (residual limb)     Time: 9179-1505 PT Time Calculation (min) (ACUTE ONLY): 42 min  Charges:  $Neuromuscular Re-education: 8-22 mins                     Sherie Don, PT, DPT  Acute Rehabilitation Services Pager [phone] Office [place] 03/29/2018, 2:53 PM

## 2018-03-29 NOTE — Progress Notes (Signed)
PROGRESS NOTE    [name]  ZOX:[id number] DOB: Aug 31, 1933 DOA: 03/24/2018 PCP: [name], MD   Brief Narrative: The patient is an 85 year old male with history of stroke, A. fib off anticoagulation for past 6-7 months due to lower extremity hematoma secondary to fall, seizure disorder, chronic kidney disease stage III, chronic diastolic CHF, OSA, CAD , was hospitalized from 1/4-1/8 with sepsis secondary to cellulitis of the right leg.  He has chronic right leg wound for which he was followed at the wound care center.  Patient was treated with IV vancomycin and Zosyn and Flagyl and discharged on Keflex + doxycycline. For past 2 days patient was increasingly confused and somnolent per his wife. Brought to the ED where tele-neurology was concern for subclinical seizures versus stroke.  Vitals were stable while Depakote level was subtherapeutic.  Given a loading dose of Depakote and transferred to [facility] for further work-up by neurology. CT head, CT angiogram head and neck negative for acute findings.  MRI of the brain showed punctate acute infarct in the right cerebellum.  **He undergoing full neurological work-up and obtained a MRI of the right leg to rule out osteomyelitis which did show Marrow edema in the distal 2.4 cm of the fibula worrisome for osteomyelitis of the Fibula.  Discussed case with ID and Orthopedic Surgery. Dr. Linus Salmons of ID recommended stopping Abx and having Ortho [place] possibly Monday but I discussed the case with Dr. [name] of Orthopedic Surgery who states patient will likely require an amputation and this is scheduled for today. Patient was reinitiated on Abx 03/27/2018 and undewent a Transtibial Amputation on 03/28/2018.  Fibrillation has not been complicated by worsening AKI likely in the setting of Lasix and IV vancomycin we will continue monitor and place the patient back on IV fluid resuscitation.  Assessment & Plan:   Principal Problem:   Altered mental  status Active Problems:   Essential hypertension   Seizure disorder (HCC)   GERD (gastroesophageal reflux disease)   Memory difficulty   CKD (chronic kidney disease) stage 3, GFR 30-59 ml/min (HCC)   Chronic diastolic (congestive) heart failure (HCC)   Chronic atrial fibrillation   Coronary artery disease involving native coronary artery of native heart without angina pectoris   Cellulitis   TIA (transient ischemic attack)   Encephalopathy   Subacute osteomyelitis, right ankle and foot (HCC)   Paralysis of right lower extremity (HCC)  Acute metabolic encephalopathy/acute ischemic stroke, improved  -MRI brain showing punctate acute infarct in the right cerebellum but symptoms did not correlate and it was felt that his AMS was from [place] -Continue statin.  Switch to full dose aspirin. -Check 2D echo, Carotid Doppler, MRA head.   -Further recommendations per stroke team. -EEG recommended by neurology but patient refused but had it done yesterday -EEG was normal and did not show any seizure or seizure predisposition recordede on the study  -ECHO showed EF of 55-60% -Check HbA1c 5.7 and Lipid Panel (LDL was 35) as below  -Continue neurochecks.  Reportedly has mild memory impairment.   -SLP evaluation, PT/OT eval to re-evaluate  -C/w ASA 325 mg po Daily and pravastatin 40 mg p.o. daily -PT/OT recommending Home Health PT for now but may change recommendations based on his Amputation and still awaiting re-evaluation   Essential Hypertension -Allow permissive HTN -Neurology permissive hypertension is okay if blood pressure is less than 220/120 but gradually normalize in 5 to 7 days with long-term blood pressure goal being normotensive. -BP was  114/78 this AM   Persistent right leg cellulitis with tibial ulcer s/p Transtibial Amputation done by Dr. [name] on 03/28/2018 -No signs of sepsis clinically.   -Wife reports that the ulceration in the lateral fibula has worsened since his hospital  discharge.   -I suspect this has progressed since recent discharge as wound care consult was not asked to evaluate during his recent hospitalization. -Continued vancomycin and cefepime but after Discussion with ID, Dr. Linus Salmons recommended stopping Abx and having Ortho evaluate for Debridement and Cx; Ortho evaluated and recommeded a Transtibial Amputation so I re-initiated Abx yesterday and placed the patient back on IV Cefepime and IV Vancomcyin and they are to stop today -Obtained MRI of the tibia to rule out osteomyelitis and showed edema in the distal 2.4 cm of the fibula worrisome for osteomyelitis.  There is negative for abscess.  There is atrophy of the lower leg musculature that was presumably related to disuse of the leg -Wound care consulted and recommendations were made with Aquacel Ag plus as well as Prevalon heel lift boots -Dr. [name] of orthopedic surgery was asked to evaluate the patient given his osteomyelitis and after my discussion with him on the phone patient may end up having amputation; This Transtibial Amputation was done yesterday -Patient is POD 1 -OrthoTech Consulted to apply [employer] -PT/OT to re-evaluate   NSVT -Noted for 10 runs of NSVT on the monitor during this Hospitalization.   -Potassium normal at 4.2.  MG 1.8 -Continue to Monitor on Telmetry   Seizure disorder (HCC) -Subtherapeutic valproate level, received loading dose in the ED and continued valproate.  Refused EEG initially but had it done today. -Neurology was following recommended continuing current dose of regimen with Divalproex 250 mg every morning and 500 mg p.o. nightly  GERD (gastroesophageal reflux disease) -Continue PPI with Pantoprazole 40 mg po Daily   AKI on CKD (chronic kidney disease) stage 3, GFR 30-59 ml/min (HCC), mild -Renal function appears slightly above baseline and in the setting of IV Vancomycin and po Lasix  -BUN/Cr went from 21/1.17 -> 18/1.26 -> 18/1.44 -> 27/1.68 -Stopped IV  Lasix and initiated IVF hydration with 75 mL/hr and will continue  -Continue to Monitor and Trend Renal Function -Repeat CMP in AM   Chronic Diastolic (congestive) Heart Failure ([place]) -Euvolemic and slightly dry still  -Was getting IVF Hydration that has now been D/C'd but I reinitiated it again today at 75 mL/hr  -C/w Metoprolol Tartrate 25 mg po BID -Furosemide 40 mg po Daily held  -Cr Slightly elevated after IVF have stopped and after Furosemide has been reinitiated and worsened so will hold Furosemide today and re-evaluate in the AM -Strict I's/O's Daily Weights; Patient is -1.418 Liters and Weight is still 165 -Continue to Monitor Volume Status Carefully  Chronic Atrial Fibrillation -Rate controlled.  Not on anticoagulation due to leg hematoma in the past. -C/w Metoprolol Tartrate 25 mg po Daily     Coronary artery disease involving native coronary artery of native heart without angina pectoris -C/w ASA 325 mg po Daily, Metoprolol Tartrate 25 mg po BID -C/w Pravastatin 40 mg po Daily  HLD -Checked  Fasting lipid panel showed cholesterol level of 88, HDL 38, LDL 35, triglycerides of 75, and VLDL of 15 -C/w Ezetimibe 5 mg po qHS and Pravastatin 40 mg po Daily  Nonhealing right leg ulcer s/p Transtibial Amputation -Was seen by ?  Dr. Trula Slade in the past.   -Wife reports patient has an appointment later this month.   -  Obtained Right Leg MRI and as above -See Above and patient to undergo Transtibial Amputation  Hypothyroidism -Continue Levothyroxine 150 mcg po Daily   Chronic arthritis and chronic pain syndrome -Continue with Home Oxycodone and Prednisone -Continue with Lidocaine Patch -IV Dilaudid for Severe Pain   Macrocytic Anemia -Patient's hemoglobin/hematocrit went from 9.5/30.3 -> 9.3/28.9 -> 9.4/29.5 -> 8.9/27.9 -Need to monitor for signs and symptoms of bleeding -Checked Anemia Panel this AM and showed iron level 55, U IBC of 161, TIBC of 216, saturation ratios  of 26%, with ferritin level of 336, folate level 45.8, and vitamin B12 level of 1321 -Repeat CBC in a.m.  Impaired fasting glucose/Pre-Diabetes -Hemoglobin A1c was 5.7 -Sugars on daily BMPs and CMP's have been ranging from 78-122 -Dietary education given -If blood sugars remain consistently elevated will place on sensitive NovoLog sliding scale insulin -May need Sliding Scale Coverage but will continue to Monitor closely    DVT prophylaxis: SCDs; Heparin 5,000 units sq q8h Code Status: FULL CODE Family Communication: Discussed with wife at bedside  Disposition Plan: Pending Further workup and evaluation by Orthopedic Surgery; Home Health PT/OT at D/C but may require SNF or CIR due to anticipated Transtibial Amputation and awaiting re-evaluation  Consultants:   Neurology   Discussed the Case with ID  Orthopedic Surgery    Procedures: CT head, CT angio head and neck, MRI brain, pending MRA head,echo, MRI rt tibia  Bilateral LE Venous Duplex -Right: There is no evidence of deep vein thrombosis in the lower extremity. No cystic structure found in the popliteal fossa. Left: There is no evidence of deep vein thrombosis in the lower extremity. No cystic structure found in the popliteal fossa.  EEG: EEG Abnormalities: None  Clinical Interpretation: This normal EEG is recorded in the waking and sleep state. There was no seizure or seizure predisposition recorded on this study. Please note that lack of epileptiform activity on EEG does not preclude the possibility of epilepsy.    Antimicrobials:  Anti-infectives (From admission, onward)   Start     Dose/Rate Route Frequency Ordered Stop   03/28/18 1200  ceFAZolin (ANCEF) IVPB 2g/100 mL premix  Status:  Discontinued     2 g 200 mL/hr over 30 Minutes Intravenous To ShortStay Surgical 03/27/18 1941 03/28/18 1450   03/27/18 1830  ceFEPIme (MAXIPIME) 1 g in sodium chloride 0.9 % 100 mL IVPB     1 g 200 mL/hr over 30 Minutes Intravenous  Every 12 hours 03/27/18 1815 03/29/18 2359   03/27/18 1830  vancomycin (VANCOCIN) 1,250 mg in sodium chloride 0.9 % 250 mL IVPB     1,250 mg 166.7 mL/hr over 90 Minutes Intravenous Every 24 hours 03/27/18 1815 03/29/18 2359   03/24/18 2200  ceFEPIme (MAXIPIME) 1 g in sodium chloride 0.9 % 100 mL IVPB  Status:  Discontinued     1 g 200 mL/hr over 30 Minutes Intravenous Every 12 hours 03/24/18 1921 03/27/18 0840   03/24/18 2200  vancomycin (VANCOCIN) IVPB 1000 mg/200 mL premix  Status:  Discontinued     1,000 mg 200 mL/hr over 60 Minutes Intravenous Every 24 hours 03/24/18 1929 03/27/18 0840   03/24/18 1845  vancomycin (VANCOCIN) IVPB 1000 mg/200 mL premix  Status:  Discontinued     1,000 mg 200 mL/hr over 60 Minutes Intravenous Every 12 hours 03/24/18 1832 03/24/18 1928   03/24/18 1045  ceFEPIme (MAXIPIME) 2 g in sodium chloride 0.9 % 100 mL IVPB     2 g 200 mL/hr over  30 Minutes Intravenous  Once 03/24/18 1038 03/24/18 1129   03/24/18 1045  metroNIDAZOLE (FLAGYL) IVPB 500 mg  Status:  Discontinued     500 mg 100 mL/hr over 60 Minutes Intravenous Every 8 hours 03/24/18 1038 03/24/18 1954   03/24/18 1045  vancomycin (VANCOCIN) IVPB 1000 mg/200 mL premix     1,000 mg 200 mL/hr over 60 Minutes Intravenous  Once 03/24/18 1038 03/25/18 1829     Subjective: Patient was seen and examined this morning at bedside states he was feeling better than he did.  States that his procedure went well.  Still complaining of some pain and states that the IV pain medication helps.  No nausea or vomiting.  I discussed with him about his worsening renal function and he understands and states he will try and drink more.  Wife had noticed that his urine had become darker.  I encouraged oral hydration as well as starting the patient on IV fluid resuscitation he is in agreement. No other concerns or complaints at this time.  Objective: Vitals:   03/28/18 1939 03/28/18 2300 03/29/18 0316 03/29/18 0754  BP: (!)  104/55 (!) 102/55 114/78   Pulse:      Resp:    17  Temp:  98 F (36.7 C) 98.3 F (36.8 C) 97.7 F (36.5 C)  TempSrc:  Oral Oral Axillary  SpO2:    100%  Weight:      Height:        Intake/Output Summary (Last 24 hours) at 03/29/2018 1256 Last data filed at 03/29/2018 0500 Gross per 24 hour  Intake 960.06 ml  Output 350 ml  Net 610.06 ml   Filed Weights   03/24/18 1142 03/28/18 1103  Weight: 74.8 kg 74.8 kg   Examination: Physical Exam:  Constitutional: Well-nourished, well-developed Caucasian male currently no acute distress appears calm and comfortable sitting up in bed and feels well and was happy with the way the amputation went.  No other concerns complaints at this time Eyes: Sclera anicteric.  Conjunctive and lids are normal. ENMT: External ears and nose appear normal.  Grossly normal hearing. Neck: Appears supple no JVD Respiratory: Diminished to auscultation bilaterally no appreciable wheezing, rales, rhonchi.  Patient not tachypneic or using accessory muscles to breathe Cardiovascular: Irregularly irregular but heart rate is controlled.  No appreciable murmurs, rubs, gallops.  Right leg is wrapped up in a boot Abdomen: Soft, nontender, nondistended.  Bowel sounds present in 4 quadrants GU: Deferred Musculoskeletal: Right BKA Skin: Skin has some bruising and ecchymosis on the upper extremities with a few skin tears.  Lower extremity right leg has been amputated and he has a Sport and exercise psychologist Neurologic: Nerves II through XII gross intact no appreciable focal deficits. Psychiatric: Judgment and insight.  Patient is awake and alert and oriented x3.  He is slightly agitated at his wife but has a pleasant mood and affect towards me  Data Reviewed: I have personally reviewed following labs and imaging studies  CBC: Recent Labs  Lab 03/24/18 1026 03/26/18 0922 03/27/18 0549 03/28/18 0553 03/29/18 0746  WBC 8.6 8.9 8.6 8.3 9.8  NEUTROABS 5.5 6.8 6.0 5.8 8.9*  HGB 9.6*  9.5* 9.3* 9.4* 8.9*  HCT 31.7* 30.3* 28.9* 29.5* 27.9*  MCV 107.5* 102.4* 101.0* 102.8* 102.2*  PLT 238 206 224 209 496   Basic Metabolic Panel: Recent Labs  Lab 03/25/18 1247 03/26/18 0445 03/27/18 0549 03/28/18 0553 03/29/18 0746  NA 140 142 142 142 140  K 3.9 3.6 3.7  3.7 4.2  CL 105 109 107 107 105  CO2 25 25 24 25 23   GLUCOSE 107* 100* 78 81 122*  BUN 26* 21 18 18  27*  CREATININE 1.14 1.17 1.26* 1.44* 1.68*  CALCIUM 8.4* 8.1* 8.2* 8.1* 7.7*  MG 1.8  --  1.7 1.9 1.8  PHOS  --   --  3.1 3.8 4.3   GFR: Estimated Creatinine Clearance: 33.9 mL/min (A) (by C-G formula based on SCr of 1.68 mg/dL (H)). Liver Function Tests: Recent Labs  Lab 03/24/18 1026 03/27/18 0549 03/28/18 0553 03/29/18 0746  AST 43* 28 28 23   ALT 56* 29 29 25   ALKPHOS 70 52 51 52  BILITOT 0.6 0.8 0.8 0.9  PROT 5.8* 5.0* 5.0* 5.3*  ALBUMIN 3.2* 2.7* 2.7* 2.8*   No results for input(s): LIPASE, AMYLASE in the last 168 hours. No results for input(s): AMMONIA in the last 168 hours. Coagulation Profile: Recent Labs  Lab 03/24/18 1026  INR 1.16   Cardiac Enzymes: No results for input(s): CKTOTAL, CKMB, CKMBINDEX, TROPONINI in the last 168 hours. BNP (last 3 results) Recent Labs    09/30/17 1107 10/22/17 1120 11/22/17 1130  PROBNP 3,272* 3,243* 3,962*   HbA1C: No results for input(s): HGBA1C in the last 72 hours. CBG: Recent Labs  Lab 03/25/18 0645  GLUCAP 71   Lipid Profile: No results for input(s): CHOL, HDL, LDLCALC, TRIG, CHOLHDL, LDLDIRECT in the last 72 hours. Thyroid Function Tests: No results for input(s): TSH, T4TOTAL, FREET4, T3FREE, THYROIDAB in the last 72 hours. Anemia Panel: Recent Labs    03/27/18 0549  VITAMINB12 1,321*  FOLATE 45.8  FERRITIN 336  TIBC 216*  IRON 55  RETICCTPCT 2.4   Sepsis Labs: Recent Labs  Lab 03/24/18 1027  LATICACIDVEN 1.00    Recent Results (from the past 240 hour(s))  Culture, blood (Routine x 2)     Status: None (Preliminary  result)   Collection Time: 03/24/18 10:27 AM  Result Value Ref Range Status   Specimen Description   Final    BLOOD LEFT ANTECUBITAL Performed at [facility] [address], [place], [place] [address]    Special Requests   Final    BOTTLES DRAWN AEROBIC AND ANAEROBIC Blood Culture adequate volume Performed at [facility] [address], [place], [place] [address]    Culture   Final    NO GROWTH 4 DAYS Performed at [facility], [place] [address], [place], [place] [address]    Report Status PENDING  Incomplete  Culture, blood (Routine x 2)     Status: None (Preliminary result)   Collection Time: 03/24/18 10:28 AM  Result Value Ref Range Status   Specimen Description   Final    BLOOD RIGHT HAND Performed at [facility] [address], [place], [place] [address]    Special Requests   Final    BOTTLES DRAWN AEROBIC AND ANAEROBIC Blood Culture results may not be optimal due to an excessive volume of blood received in culture bottles Performed at [facility] [address] [place], [place] [address]    Culture   Final    NO GROWTH 4 DAYS Performed at [facility], [place] [address], [place], [place] [address]    Report Status PENDING  Incomplete  Urine Culture     Status: None   Collection Time: 03/26/18  2:51 PM  Result Value Ref Range Status   Specimen Description URINE, RANDOM  Final   Special Requests NONE  Final   Culture   Final    NO GROWTH Performed at [facility], [place] [address] [place], [place] [address]    Report Status 03/27/2018 FINAL  Final  Surgical PCR screen     Status: Abnormal   Collection Time: 03/27/18  8:44 PM  Result Value Ref Range Status   MRSA, PCR POSITIVE (A) NEGATIVE Final    Comment: RESULT CALLED TO, READ BACK BY AND VERIFIED WITH: I.ESSIEN AKPAN,RN AT 2347 03/27/18,BY L.PITT    Staphylococcus aureus POSITIVE (A) NEGATIVE Final    Comment:  (NOTE) The Xpert SA Assay (FDA approved for NASAL specimens in patients 37 years of age and older), is one component of a comprehensive surveillance program. It is not intended to diagnose infection nor to guide or monitor treatment.     Radiology Studies: No results found. Scheduled Meds: .  stroke: mapping our early stages of recovery book   Does not apply Once  . aspirin EC  325 mg Oral Daily  . diltiazem  300 mg Oral Daily  . divalproex  250 mg Oral q morning - 10a  . divalproex  500 mg Oral QHS  . docusate sodium  100 mg Oral BID  . doxazosin  4 mg Oral QPM  . ezetimibe  5 mg Oral QHS  . heparin injection (subcutaneous)  5,000 Units Subcutaneous Q8H  . levothyroxine  150 mcg Oral QAC breakfast  . metoprolol tartrate  25 mg Oral BID PC  . mupirocin ointment  1 application Nasal BID  . pantoprazole  40 mg Oral Daily  . polyethylene glycol  17 g Oral BID  . pravastatin  40 mg Oral QPM  . predniSONE  10 mg Oral Q breakfast  . senna-docusate  1 tablet Oral BID   Continuous Infusions: . sodium chloride 10 mL/hr at 03/29/18 0740  . sodium chloride 75 mL/hr at 03/29/18 1125  . ceFEPime (MAXIPIME) IV 1 g (03/29/18 1127)  . lactated ringers    . methocarbamol (ROBAXIN) IV    . vancomycin Stopped (03/28/18 1956)    LOS: 4 days   Kerney Elbe, DO [facility] PAGER is on AMION  If 7PM-7AM, please contact night-coverage [email or web address] Password [facility] 03/29/2018, 12:56 PM

## 2018-03-29 NOTE — Progress Notes (Signed)
CSW acknowledges consult. Most recent PT note states that he is being recommended for CIR.   CSW is signing off for now.   Domenic Schwab, MSW, [place]

## 2018-03-30 LAB — COMPREHENSIVE METABOLIC PANEL
ALT: 21 U/L (ref 0–44)
AST: 22 U/L (ref 15–41)
Albumin: 2.7 g/dL — ABNORMAL LOW (ref 3.5–5.0)
Alkaline Phosphatase: 50 U/L (ref 38–126)
Anion gap: 10 (ref 5–15)
BUN: 34 mg/dL — ABNORMAL HIGH (ref 8–23)
CO2: 23 mmol/L (ref 22–32)
Calcium: 7.4 mg/dL — ABNORMAL LOW (ref 8.9–10.3)
Chloride: 107 mmol/L (ref 98–111)
Creatinine, Ser: 1.54 mg/dL — ABNORMAL HIGH (ref 0.61–1.24)
GFR calc Af Amer: 48 mL/min — ABNORMAL LOW (ref >60)
GFR calc non Af Amer: 41 mL/min — ABNORMAL LOW (ref >60)
Glucose, Bld: 99 mg/dL (ref 70–99)
Potassium: 4.3 mmol/L (ref 3.5–5.1)
Sodium: 140 mmol/L (ref 135–145)
Total Bilirubin: 0.4 mg/dL (ref 0.3–1.2)
Total Protein: 4.8 g/dL — ABNORMAL LOW (ref 6.5–8.1)

## 2018-03-30 LAB — CBC WITH DIFFERENTIAL/PLATELET
Abs Immature Granulocytes: 0.23 10*3/uL — ABNORMAL HIGH (ref 0.00–0.07)
Basophils Absolute: 0 10*3/uL (ref 0.0–0.1)
Basophils Relative: 0 %
Eosinophils Absolute: 0 10*3/uL (ref 0.0–0.5)
Eosinophils Relative: 0 %
HCT: 28.4 % — ABNORMAL LOW (ref 39.0–52.0)
Hemoglobin: 8.8 g/dL — ABNORMAL LOW (ref 13.0–17.0)
Immature Granulocytes: 2 %
Lymphocytes Relative: 7 %
Lymphs Abs: 0.7 10*3/uL (ref 0.7–4.0)
MCH: 32.2 pg (ref 26.0–34.0)
MCHC: 31 g/dL (ref 30.0–36.0)
MCV: 104 fL — ABNORMAL HIGH (ref 80.0–100.0)
Monocytes Absolute: 0.7 10*3/uL (ref 0.1–1.0)
Monocytes Relative: 7 %
Neutro Abs: 8.7 10*3/uL — ABNORMAL HIGH (ref 1.7–7.7)
Neutrophils Relative %: 84 %
Platelets: 197 10*3/uL (ref 150–400)
RBC: 2.73 MIL/uL — ABNORMAL LOW (ref 4.22–5.81)
RDW: 14.8 % (ref 11.5–15.5)
WBC: 10.4 10*3/uL (ref 4.0–10.5)
nRBC: 0 % (ref 0.0–0.2)

## 2018-03-30 LAB — PHOSPHORUS: Phosphorus: 3.8 mg/dL (ref 2.5–4.6)

## 2018-03-30 LAB — MAGNESIUM: Magnesium: 1.9 mg/dL (ref 1.7–2.4)

## 2018-03-30 MED ORDER — SODIUM CHLORIDE 0.9 % IV SOLN
INTRAVENOUS | Status: AC
  Administered 2018-03-30: 18:00:00 via INTRAVENOUS

## 2018-03-30 NOTE — Progress Notes (Signed)
Occupational Therapy Treatment Patient Details Name: [name] MRN: [id number] DOB: [birth year] Today's Date: 03/30/2018    History of present illness Patient is a 85 y/o male who presents with AMS and worsening RLE infection. Admitted with encephalopathy and possible absence seizures. 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  1/17 R transtibial amputation by Dr. Sharol [name].  PMH includes A-fib, CAD, CHF, gait abnormality, vertigo, seizure, cervical fusion, RLE wound, memory deficits.    OT comments  [name] admitted with above seen today for Bil UE exercises due to he is having to heavily rely on arm since trans-tibial amputation on 03/28/2018. Wife very appreciative of theraband and exercises [name] and she is to exercises with pt 3 x/day 5 reps each.  Follow Up Recommendations  CIR;Supervision/Assistance - 24 hour    Equipment Recommendations  Other (comment)(TBD next venue)       Precautions / Restrictions Precautions Precautions: Fall Precaution Comments: R transtibial amputation with residual limb protecter Restrictions Weight Bearing Restrictions: Yes RUE Weight Bearing: Non weight bearing              ADL either performed or assessed with clinical judgement                        Exercises Other Exercises Other Exercises: Pt seen for education on Bil UE exercises with theraband level 1 from recliner level. Pt able to do 6 exercises (3 for each arm) 5 reps each with min A and instructional cues. Educated wife as well so she can have pt do them and help him prn.                     Frequency  Min 2X/week        Progress Toward Goals  OT Goals(current goals can now be found in the care plan section)  Progress towards OT goals: Progressing toward goals     Plan Discharge plan remains appropriate       AM-PAC OT "6 Clicks" Daily Activity     Outcome Measure   Help from  another person eating meals?: A Little Help from another person taking care of personal grooming?: A Little Help from another person toileting, which includes using toliet, bedpan, or urinal?: Total Help from another person bathing (including washing, rinsing, drying)?: A Lot Help from another person to put on and taking off regular upper body clothing?: A Little Help from another person to put on and taking off regular lower body clothing?: Total 6 Click Score: 13    End of Session    OT Visit Diagnosis: Unsteadiness on feet (R26.81);Other abnormalities of gait and mobility (R26.89);Muscle weakness (generalized) (M62.81)   Activity Tolerance Patient tolerated treatment well   Patient Left in chair;with call bell/phone within reach;with family/visitor present   Nurse Communication          Time: 6222-9798 OT Time Calculation (min): 31 min  Charges: OT General Charges $OT Visit: 1 Visit OT Treatments $Therapeutic Exercise: 23-37 mins  [name], OTR/L [facility] [employer] Pager [phone] Office [phone]      [name] 03/30/2018, 2:28 PM

## 2018-03-30 NOTE — Progress Notes (Signed)
Physical Therapy Treatment Patient Details Name: [name] MRN: [id number] DOB: Jun 24, 1933 Today's Date: 03/30/2018    History of Present Illness Patient is a 85 y/o male who presents with AMS and worsening RLE infection. Admitted with encephalopathy and possible absence seizures. 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  1/17 R transtibial amputation by Dr. [name].  PMH includes A-fib, CAD, CHF, gait abnormality, vertigo, seizure, cervical fusion, RLE wound, memory deficits.     PT Comments    Patient seen for mobility progression. This session focused on functional transfers. Pt stood X 3 trials with mod/max A +2. Pt led through LE exercises. Pt continues to present with cognitive deficits and follows cues inconsistently and with increased time. Wife present throughout session.  Wound vac line is making an indention in pt's R LE going up to the knee and skin is moist under shrinker and sock. RN notified of concern for possible pressure injury from line and/or skin breakdown. Continue to progress as tolerated.    Follow Up Recommendations  CIR     Equipment Recommendations  None recommended by PT    Recommendations for Other Services Rehab consult     Precautions / Restrictions Precautions Precautions: Fall Precaution Comments: R transtibial amputation with residual limb protecter Restrictions Weight Bearing Restrictions: Yes RUE Weight Bearing: Non weight bearing    Mobility  Bed Mobility Overal bed mobility: Needs Assistance Bed Mobility: Supine to Sit;Sit to Supine     Supine to sit: Mod assist;+2 for physical assistance;HOB elevated Sit to supine: Mod assist;+2 for physical assistance   General bed mobility comments: multimodal cues for sequencing; assist to bring hips to EOB and elevate trunk into sitting and then to guide trunk down and bring bilat LE into bed   Transfers Overall transfer level:  Needs assistance Equipment used: Rolling walker (2 wheeled) Transfers: Sit to/from Stand Sit to Stand: Mod assist;+2 physical assistance;Max assist;From elevated surface         General transfer comment: pt stood X 3 triasl from EOB; more assistance needed last trial due to fatigue; cues for hand placement and assistance/cues for L foot positioning for improved BOS; assist to power up and for balance in standing with mutimodal cues for upright posture; pt bracing self with L LE against EOB  Ambulation/Gait                 Stairs             Wheelchair Mobility    Modified Rankin (Stroke Patients Only) Modified Rankin (Stroke Patients Only) Pre-Morbid Rankin Score: Moderately severe disability Modified Rankin: Moderately severe disability     Balance Overall balance assessment: Needs assistance Sitting-balance support: No upper extremity supported;Feet supported Sitting balance-Leahy Scale: Fair     Standing balance support: Bilateral upper extremity supported Standing balance-Leahy Scale: Poor Standing balance comment: L lateral lean                            Cognition Arousal/Alertness: Awake/alert Behavior During Therapy: WFL for tasks assessed/performed Overall Cognitive Status: Impaired/Different from baseline Area of Impairment: Following commands;Safety/judgement;Problem solving                       Following Commands: Follows one step commands with increased time;Follows one step commands inconsistently Safety/Judgement: Decreased awareness of deficits;Decreased awareness of safety   Problem Solving: Slow processing;Decreased  initiation;Difficulty sequencing;Requires verbal cues;Requires tactile cues        Exercises Amputee Exercises Hip ABduction/ADduction: AAROM;Right Hip Flexion/Marching: AAROM;Right Straight Leg Raises: AROM;Right Other Exercises Other Exercises: Pt seen for education on Bil UE exercises with  theraband level 1 from recliner level. Pt able to do 6 exercises (3 for each arm) 5 reps each with min A and instructional cues. Educated wife as well so she can have pt do them and help him prn.     General Comments General comments (skin integrity, edema, etc.): wound vac line going up R LE is making indention in pt's skin and residual limb is moist under shrinker. concern for pressure injury?/skin integrity.  RN notified       Pertinent Vitals/Pain Pain Assessment: Faces Faces Pain Scale: Hurts a little bit Pain Location: R LE in dependent position Pain Descriptors / Indicators: Discomfort;Guarding Pain Intervention(s): Limited activity within patient's tolerance;Monitored during session;Repositioned    Home Living                      Prior Function            PT Goals (current goals can now be found in the care plan section) Progress towards PT goals: Progressing toward goals    Frequency    Min 5X/week      PT Plan Discharge plan needs to be updated;Frequency needs to be updated    Co-evaluation              AM-PAC PT "6 Clicks" Mobility   Outcome Measure  Help needed turning from your back to your side while in a flat bed without using bedrails?: A Little Help needed moving from lying on your back to sitting on the side of a flat bed without using bedrails?: A Lot Help needed moving to and from a bed to a chair (including a wheelchair)?: A Lot Help needed standing up from a chair using your arms (e.g., wheelchair or bedside chair)?: A Lot Help needed to walk in hospital room?: Total Help needed climbing 3-5 steps with a railing? : Total 6 Click Score: 11    End of Session Equipment Utilized During Treatment: Gait belt Activity Tolerance: Patient tolerated treatment well Patient left: in chair;with call bell/phone within reach;with chair alarm set;with family/visitor present Nurse Communication: Mobility status PT Visit Diagnosis: Other  abnormalities of gait and mobility (R26.89);Muscle weakness (generalized) (M62.81);Pain Pain - Right/Left: Right Pain - part of body: (residual limb)     Time: 1440-1500 PT Time Calculation (min) (ACUTE ONLY): 20 min  Charges:  $Gait Training: 8-22 mins                     Earney Navy, PTA Acute Rehabilitation Services Pager: [phone] Office: [phone]     Darliss Cheney 03/30/2018, 5:03 PM

## 2018-03-30 NOTE — Progress Notes (Signed)
PROGRESS NOTE    [name]  IZT:[id number] DOB: 07/14/1933 DOA: 03/24/2018 PCP: [name], MD   Brief Narrative: The patient is an 85 year old male with history of stroke, A. fib off anticoagulation for past 6-7 months due to lower extremity hematoma secondary to fall, seizure disorder, chronic kidney disease stage III, chronic diastolic CHF, OSA, CAD , was hospitalized from 1/4-1/8 with sepsis secondary to cellulitis of the right leg.  He has chronic right leg wound for which he was followed at the wound care center.  Patient was treated with IV vancomycin and Zosyn and Flagyl and discharged on Keflex + doxycycline. For past 2 days patient was increasingly confused and somnolent per his wife. Brought to the ED where tele-neurology was concern for subclinical seizures versus stroke.  Vitals were stable while Depakote level was subtherapeutic.  Given a loading dose of Depakote and transferred to [facility] for further work-up by neurology. CT head, CT angiogram head and neck negative for acute findings.  MRI of the brain showed punctate acute infarct in the right cerebellum.  **He underwent full neurological work-up for CVA and obtained a MRI of the right leg to rule out osteomyelitis which did show Marrow edema in the distal 2.4 cm of the fibula worrisome for osteomyelitis of the Fibula.  Discussed case with ID and Orthopedic Surgery. Dr. Linus Salmons of ID recommended stopping Abx and having Ortho [place]br[place] possibly Monday but I discussed the case with Dr. [name] of Orthopedic Surgery who states patient will likely require an amputation and this was done. Patient was reinitiated on Abx 03/27/2018 and undewent a Transtibial Amputation on 03/28/2018.  Hospitalization has been complicated by worsening AKI likely in the setting of Lasix and IV vancomycin we will continue monitor and place the patient back on IV fluid resuscitation and continue through today.   Assessment & Plan:   Principal Problem:  Altered mental status Active Problems:   Essential hypertension   Seizure disorder (HCC)   GERD (gastroesophageal reflux disease)   Memory difficulty   CKD (chronic kidney disease) stage 3, GFR 30-59 ml/min (HCC)   Chronic diastolic (congestive) heart failure (HCC)   Chronic atrial fibrillation   Coronary artery disease involving native coronary artery of native heart without angina pectoris   Cellulitis   TIA (transient ischemic attack)   Encephalopathy   Subacute osteomyelitis, right ankle and foot (HCC)   Paralysis of right lower extremity (HCC)  Acute metabolic encephalopathy/acute ischemic stroke, improved  -MRI brain showing punctate acute infarct in the right cerebellum but symptoms did not correlate and it was felt that his AMS was from [place] -Continue statin.  Switch to full dose aspirin. -Check 2D echo, Carotid Doppler, MRA head.   -Further recommendations per stroke team. -EEG recommended by neurology but patient refused but had it done yesterday -EEG was normal and did not show any seizure or seizure predisposition recordede on the study  -ECHO showed EF of 55-60% -Check HbA1c 5.7 and Lipid Panel (LDL was 35) as below  -Continue neurochecks.  Reportedly has mild memory impairment.   -SLP evaluation, PT/OT eval to re-evaluate and recommending CIR now -C/w ASA 325 mg po Daily and pravastatin 40 mg p.o. daily -PT/OT recommending Home Health PT for now but may change recommendations based on his Amputation and still awaiting re-evaluation and recommending CIR  Essential Hypertension -Allow permissive HTN -Neurology permissive hypertension is okay if blood pressure is less than 220/120 but gradually normalize in 5 to 7 days with long-term  blood pressure goal being normotensive. -BP was 116/63 this AM   Persistent right leg cellulitis with tibial ulcer s/p Transtibial Amputation done by Dr. [name] on 03/28/2018 -No signs of sepsis clinically.   -Wife reports that the  ulceration in the lateral fibula has worsened since his hospital discharge.   -I suspect this has progressed since recent discharge as wound care consult was not asked to evaluate during his recent hospitalization. -Continued vancomycin and cefepime but after Discussion with ID, Dr. Linus Salmons recommended stopping Abx and having Ortho evaluate for Debridement and Cx; Ortho evaluated and recommeded a Transtibial Amputation so I re-initiated Abx yesterday and placed the patient back on IV Cefepime and IV Vancomcyin and they are to stop today -Obtained MRI of the tibia to rule out osteomyelitis and showed edema in the distal 2.4 cm of the fibula worrisome for osteomyelitis.  There is negative for abscess.  There is atrophy of the lower leg musculature that was presumably related to disuse of the leg -Wound care consulted and recommendations were made with Aquacel Ag plus as well as Prevalon heel lift boots -Dr. [name] of orthopedic surgery was asked to evaluate the patient given his osteomyelitis and after my discussion with him on the phone patient may end up having amputation; This Transtibial Amputation was done yesterday -Patient is POD 2 -OrthoTech Consulted to apply [employer] -PT/OT to re-evaluate and recommending CIR  NSVT -Noted for 10 runs of NSVT on the monitor during this Hospitalization.   -Potassium normal at 4.3.  MG 1.9 -Continue to Monitor on Telmetry   Seizure disorder (HCC) -Subtherapeutic valproate level, received loading dose in the ED and continued valproate.  Refused EEG initially but had it done today. -Neurology was following recommended continuing current dose of regimen with Divalproex 250 mg every morning and 500 mg p.o. nightly  GERD (gastroesophageal reflux disease) -Continue PPI with Pantoprazole 40 mg po Daily   AKI on CKD (chronic kidney disease) stage 3, GFR 30-59 ml/min (HCC), mild -Renal function appears slightly above baseline and in the setting of IV Vancomycin  and po Lasix  -BUN/Cr went from 21/1.17 -> 18/1.26 -> 18/1.44 -> 27/1.68 -> 32/1.87 -> 34/1.54 -Stopped Lasix and initiated IVF hydration with 75 mL/hr and will continue for 12 more hours today  -Continue to Monitor and Trend Renal Function -Repeat CMP in AM   Chronic Diastolic (congestive) Heart Failure ([place]) -Euvolemic and slightly dry still  -Was getting IVF Hydration that has now been D/C'd but I reinitiated it again today at 75 mL/hr  -C/w Metoprolol Tartrate 25 mg po BID -Furosemide 40 mg po Daily held  -Cr Slightly elevated after IVF have stopped and after Furosemide has been reinitiated and worsened so will hold Furosemide again today and re-evaluate in the AM -Strict I's/O's Daily Weights; Patient is -0.573 Liters and Weight is still 165 -Continue to Monitor Volume Status Carefully  Chronic Atrial Fibrillation -Rate controlled.  Not on anticoagulation due to leg hematoma in the past. -C/w Metoprolol Tartrate 25 mg po Daily  -Continue     Coronary artery disease involving native coronary artery of native heart without angina pectoris -C/w ASA 325 mg po Daily, Metoprolol Tartrate 25 mg po BID -C/w Pravastatin 40 mg po Daily  HLD -Checked  Fasting lipid panel showed cholesterol level of 88, HDL 38, LDL 35, triglycerides of 75, and VLDL of 15 -C/w Ezetimibe 5 mg po qHS and Pravastatin 40 mg po Daily  Nonhealing right leg ulcer s/p Transtibial Amputation -Was  seen by ?  Dr. Trula Slade in the past.   -Wife reports patient has an appointment later this month.   -Obtained Right Leg MRI and as above -See Above and patient to undergo Transtibial Amputation  Hypothyroidism -Continue Levothyroxine 150 mcg po Daily   Chronic arthritis and chronic pain syndrome -Continue with Home Oxycodone and Prednisone -Continue with Lidocaine Patch -IV Dilaudid for Severe Pain   Macrocytic Anemia -Patient's hemoglobin/hematocrit went from 9.5/30.3 -> 9.3/28.9 -> 9.4/29.5 -> 8.9/27.9 ->  8.8/28.4 -Need to monitor for signs and symptoms of bleeding -Checked Anemia Panel this AM and showed iron level 55, U IBC of 161, TIBC of 216, saturation ratios of 26%, with ferritin level of 336, folate level 45.8, and vitamin B12 level of 1321 -Repeat CBC in a.m.  Impaired Fasting Glucose/Pre-Diabetes -Hemoglobin A1c was 5.7 -Sugars on daily BMPs and CMP's have been ranging from 78-160 -Dietary education given -If blood sugars remain consistently elevated will place on sensitive NovoLog sliding scale insulin -May need Sliding Scale Coverage but will continue to Monitor closely    DVT prophylaxis: SCDs; Heparin 5,000 units sq q8h Code Status: FULL CODE Family Communication: Discussed with wife at bedside  Disposition Plan: Pending Further workup and evaluation by Orthopedic Surgery; Home Health PT/OT at D/C but may require SNF or CIR due to anticipated Transtibial Amputation and awaiting re-evaluation  Consultants:   Neurology   Discussed the Case with ID  Orthopedic Surgery    Procedures: CT head, CT angio head and neck, MRI brain, pending MRA head,echo, MRI rt tibia  Bilateral LE Venous Duplex -Right: There is no evidence of deep vein thrombosis in the lower extremity. No cystic structure found in the popliteal fossa. Left: There is no evidence of deep vein thrombosis in the lower extremity. No cystic structure found in the popliteal fossa.  EEG: EEG Abnormalities: None  Clinical Interpretation: This normal EEG is recorded in the waking and sleep state. There was no seizure or seizure predisposition recorded on this study. Please note that lack of epileptiform activity on EEG does not preclude the possibility of epilepsy.    Antimicrobials:  Anti-infectives (From admission, onward)   Start     Dose/Rate Route Frequency Ordered Stop   03/28/18 1200  ceFAZolin (ANCEF) IVPB 2g/100 mL premix  Status:  Discontinued     2 g 200 mL/hr over 30 Minutes Intravenous To ShortStay  Surgical 03/27/18 1941 03/28/18 1450   03/27/18 1830  ceFEPIme (MAXIPIME) 1 g in sodium chloride 0.9 % 100 mL IVPB     1 g 200 mL/hr over 30 Minutes Intravenous Every 12 hours 03/27/18 1815 03/29/18 2347   03/27/18 1830  vancomycin (VANCOCIN) 1,250 mg in sodium chloride 0.9 % 250 mL IVPB     1,250 mg 166.7 mL/hr over 90 Minutes Intravenous Every 24 hours 03/27/18 1815 03/29/18 2007   03/24/18 2200  ceFEPIme (MAXIPIME) 1 g in sodium chloride 0.9 % 100 mL IVPB  Status:  Discontinued     1 g 200 mL/hr over 30 Minutes Intravenous Every 12 hours 03/24/18 1921 03/27/18 0840   03/24/18 2200  vancomycin (VANCOCIN) IVPB 1000 mg/200 mL premix  Status:  Discontinued     1,000 mg 200 mL/hr over 60 Minutes Intravenous Every 24 hours 03/24/18 1929 03/27/18 0840   03/24/18 1845  vancomycin (VANCOCIN) IVPB 1000 mg/200 mL premix  Status:  Discontinued     1,000 mg 200 mL/hr over 60 Minutes Intravenous Every 12 hours 03/24/18 1832 03/24/18 1928  03/24/18 1045  ceFEPIme (MAXIPIME) 2 g in sodium chloride 0.9 % 100 mL IVPB     2 g 200 mL/hr over 30 Minutes Intravenous  Once 03/24/18 1038 03/24/18 1129   03/24/18 1045  metroNIDAZOLE (FLAGYL) IVPB 500 mg  Status:  Discontinued     500 mg 100 mL/hr over 60 Minutes Intravenous Every 8 hours 03/24/18 1038 03/24/18 1954   03/24/18 1045  vancomycin (VANCOCIN) IVPB 1000 mg/200 mL premix     1,000 mg 200 mL/hr over 60 Minutes Intravenous  Once 03/24/18 1038 03/25/18 1829     Subjective: Patient was seen and examined this morning at 9 he states he is doing okay.  I will order some bleeding from his gown and pull back his gown and noticed bleeding from where he was just injected with heparin.  I cleaned it and put gauze on it and bleeding eventually stopped.  He denies any lightheadedness or dizziness but states that he had an okay night last night did not give real stress.  Still hopeful to going to inpatient rehabilitation.  No other concerns complaints at this time  and is happy that his kidney function is improving  Objective: Vitals:   03/30/18 0015 03/30/18 0345 03/30/18 0756 03/30/18 1109  BP: 111/67 120/73 122/77 116/63  Pulse: 73 82  64  Resp: 18 18    Temp: 98.4 F (36.9 C) 98 F (36.7 C) 98 F (36.7 C) 98.1 F (36.7 C)  TempSrc: Oral Oral Oral Oral  SpO2: 97% 95% 92% 94%  Weight:      Height:        Intake/Output Summary (Last 24 hours) at 03/30/2018 1346 Last data filed at 03/30/2018 0555 Gross per 24 hour  Intake 1394.84 ml  Output 550 ml  Net 844.84 ml   Filed Weights   03/24/18 1142 03/28/18 1103  Weight: 74.8 kg 74.8 kg   Examination: Physical Exam:  Constitutional: Well-nourished, well-developed Caucasian male currently no acute distress appears calm and comfortable sitting up in bed Eyes: Sclera anicteric.  Conjunctive lids are normal ENMT: External ears and nose appear normal.  Grossly normal hearing Neck: Appears supple with no JVD Respiratory: Diminished to auscultation bilaterally no appreciable wheezing, rales, rhonchi.  Patient not tachypneic wheezing excess muscle breathe Cardiovascular: Irregularly irregular but rate is controlled.  No appreciable murmurs, rubs, gallops. Abdomen: Soft, nontender, nondistended.  Bowel sounds present all 4 quadrants.  He has some bleeding in the right abdomen. GU: Deferred Musculoskeletal: Right BKA Skin: Skin is warm and dry and has some bruising ecchymosis upper extremities with a few skin tears.  He has a right BKA and that is covered with a Sport and exercise psychologist.  He also has some bleeding noticed from an injection site in his abdomen on the left. Neurologic: Cranial nerves II through XII grossly intact no appreciable focal deficits Psychiatric: Normal judgment and insight.  Patient is awake and alert and oriented x3.  Pleasant mood and affect  Data Reviewed: I have personally reviewed following labs and imaging studies  CBC: Recent Labs  Lab 03/26/18 0922 03/27/18 0549  03/28/18 0553 03/29/18 0746 03/30/18 0619  WBC 8.9 8.6 8.3 9.8 10.4  NEUTROABS 6.8 6.0 5.8 8.9* 8.7*  HGB 9.5* 9.3* 9.4* 8.9* 8.8*  HCT 30.3* 28.9* 29.5* 27.9* 28.4*  MCV 102.4* 101.0* 102.8* 102.2* 104.0*  PLT 206 224 209 192 751   Basic Metabolic Panel: Recent Labs  Lab 03/25/18 1247  03/27/18 0549 03/28/18 0553 03/29/18 0746 03/29/18 1822 03/30/18  4854  NA 140   < > 142 142 140 137 140  K 3.9   < > 3.7 3.7 4.2 4.6 4.3  CL 105   < > 107 107 105 105 107  CO2 25   < > 24 25 23 22 23   GLUCOSE 107*   < > 78 81 122* 160* 99  BUN 26*   < > 18 18 27* 32* 34*  CREATININE 1.14   < > 1.26* 1.44* 1.68* 1.87* 1.54*  CALCIUM 8.4*   < > 8.2* 8.1* 7.7* 7.5* 7.4*  MG 1.8  --  1.7 1.9 1.8  --  1.9  PHOS  --   --  3.1 3.8 4.3  --  3.8   < > = values in this interval not displayed.   GFR: Estimated Creatinine Clearance: 37 mL/min (A) (by C-G formula based on SCr of 1.54 mg/dL (H)). Liver Function Tests: Recent Labs  Lab 03/24/18 1026 03/27/18 0549 03/28/18 0553 03/29/18 0746 03/30/18 0619  AST 43* 28 28 23 22   ALT 56* 29 29 25 21   ALKPHOS 70 52 51 52 50  BILITOT 0.6 0.8 0.8 0.9 0.4  PROT 5.8* 5.0* 5.0* 5.3* 4.8*  ALBUMIN 3.2* 2.7* 2.7* 2.8* 2.7*   No results for input(s): LIPASE, AMYLASE in the last 168 hours. No results for input(s): AMMONIA in the last 168 hours. Coagulation Profile: Recent Labs  Lab 03/24/18 1026  INR 1.16   Cardiac Enzymes: No results for input(s): CKTOTAL, CKMB, CKMBINDEX, TROPONINI in the last 168 hours. BNP (last 3 results) Recent Labs    09/30/17 1107 10/22/17 1120 11/22/17 1130  PROBNP 3,272* 3,243* 3,962*   HbA1C: No results for input(s): HGBA1C in the last 72 hours. CBG: Recent Labs  Lab 03/25/18 0645  GLUCAP 71   Lipid Profile: No results for input(s): CHOL, HDL, LDLCALC, TRIG, CHOLHDL, LDLDIRECT in the last 72 hours. Thyroid Function Tests: No results for input(s): TSH, T4TOTAL, FREET4, T3FREE, THYROIDAB in the last 72  hours. Anemia Panel: No results for input(s): VITAMINB12, FOLATE, FERRITIN, TIBC, IRON, RETICCTPCT in the last 72 hours. Sepsis Labs: Recent Labs  Lab 03/24/18 1027  LATICACIDVEN 1.00    Recent Results (from the past 240 hour(s))  Culture, blood (Routine x 2)     Status: None   Collection Time: 03/24/18 10:27 AM  Result Value Ref Range Status   Specimen Description   Final    BLOOD LEFT ANTECUBITAL Performed at [facility] [address], [place], [place] [address]    Special Requests   Final    BOTTLES DRAWN AEROBIC AND ANAEROBIC Blood Culture adequate volume Performed at [facility] [address], [place], [place] [address]    Culture   Final    NO GROWTH 5 DAYS Performed at [facility], [place] [address], [place], [place] [address]    Report Status 03/29/2018 FINAL  Final  Culture, blood (Routine x 2)     Status: None   Collection Time: 03/24/18 10:28 AM  Result Value Ref Range Status   Specimen Description   Final    BLOOD RIGHT HAND Performed at [facility] [address] [place],  [address]    Special Requests   Final    BOTTLES DRAWN AEROBIC AND ANAEROBIC Blood Culture results may not be optimal due to an excessive volume of blood received in culture bottles Performed at [facility] [address] [place], [place]  [address]    Culture   Final    NO GROWTH 5 DAYS Performed at [facility], [place] [address] [place], [place] [address]    Report Status 03/29/2018 FINAL  Final  Urine Culture     Status: None   Collection Time: 03/26/18  2:51 PM  Result Value Ref Range Status   Specimen Description URINE, RANDOM  Final   Special Requests NONE  Final   Culture   Final    NO GROWTH Performed at [facility], [place] [address], [place], [place] [address]    Report Status 03/27/2018 FINAL  Final  Surgical PCR screen     Status: Abnormal   Collection Time:  03/27/18  8:44 PM  Result Value Ref Range Status   MRSA, PCR POSITIVE (A) NEGATIVE Final    Comment: RESULT CALLED TO, READ BACK BY AND VERIFIED WITH: I.ESSIEN AKPAN,RN AT 2347 03/27/18,BY L.PITT    Staphylococcus aureus POSITIVE (A) NEGATIVE Final    Comment: (NOTE) The Xpert SA Assay (FDA approved for NASAL specimens in patients 62 years of age and older), is one component of a comprehensive surveillance program. It is not intended to diagnose infection nor to guide or monitor treatment.     Radiology Studies: No results found. Scheduled Meds: .  stroke: mapping our early stages of recovery book   Does not apply Once  . aspirin EC  325 mg Oral Daily  . diltiazem  300 mg Oral Daily  . divalproex  250 mg Oral q morning - 10a  . divalproex  500 mg Oral QHS  . docusate sodium  100 mg Oral BID  . doxazosin  4 mg Oral QPM  . ezetimibe  5 mg Oral QHS  . heparin injection (subcutaneous)  5,000 Units Subcutaneous Q8H  . levothyroxine  150 mcg Oral QAC breakfast  . metoprolol tartrate  25 mg Oral BID PC  . mupirocin ointment  1 application Nasal BID  . pantoprazole  40 mg Oral Daily  . polyethylene glycol  17 g Oral BID  . pravastatin  40 mg Oral QPM  . predniSONE  10 mg Oral Q breakfast  . senna-docusate  1 tablet Oral BID   Continuous Infusions: . sodium chloride 75 mL/hr at 03/30/18 0848  . methocarbamol (ROBAXIN) IV      LOS: 5 days   Kerney Elbe, DO [facility] PAGER is on AMION  If 7PM-7AM, please contact night-coverage [email or web address] Password [email or web address] 03/30/2018, 1:46 PM

## 2018-03-31 ENCOUNTER — Encounter (HOSPITAL_COMMUNITY): Payer: Self-pay | Admitting: Orthopedic Surgery

## 2018-03-31 DIAGNOSIS — N179 Acute kidney failure, unspecified: Secondary | ICD-10-CM

## 2018-03-31 DIAGNOSIS — R0682 Tachypnea, not elsewhere classified: Secondary | ICD-10-CM

## 2018-03-31 DIAGNOSIS — N183 Chronic kidney disease, stage 3 (moderate): Secondary | ICD-10-CM

## 2018-03-31 DIAGNOSIS — G8918 Other acute postprocedural pain: Secondary | ICD-10-CM

## 2018-03-31 DIAGNOSIS — I482 Chronic atrial fibrillation, unspecified: Secondary | ICD-10-CM

## 2018-03-31 DIAGNOSIS — N50812 Left testicular pain: Secondary | ICD-10-CM

## 2018-03-31 DIAGNOSIS — S88119A Complete traumatic amputation at level between knee and ankle, unspecified lower leg, initial encounter: Secondary | ICD-10-CM

## 2018-03-31 DIAGNOSIS — T8743 Infection of amputation stump, right lower extremity: Secondary | ICD-10-CM

## 2018-03-31 DIAGNOSIS — S88111A Complete traumatic amputation at level between knee and ankle, right lower leg, initial encounter: Secondary | ICD-10-CM

## 2018-03-31 DIAGNOSIS — I10 Essential (primary) hypertension: Secondary | ICD-10-CM

## 2018-03-31 DIAGNOSIS — I2510 Atherosclerotic heart disease of native coronary artery without angina pectoris: Secondary | ICD-10-CM

## 2018-03-31 DIAGNOSIS — Z89511 Acquired absence of right leg below knee: Secondary | ICD-10-CM

## 2018-03-31 DIAGNOSIS — I4891 Unspecified atrial fibrillation: Secondary | ICD-10-CM

## 2018-03-31 LAB — CBC WITH DIFFERENTIAL/PLATELET
Abs Immature Granulocytes: 0.44 10*3/uL — ABNORMAL HIGH (ref 0.00–0.07)
Basophils Absolute: 0 10*3/uL (ref 0.0–0.1)
Basophils Relative: 0 %
Eosinophils Absolute: 0 10*3/uL (ref 0.0–0.5)
Eosinophils Relative: 0 %
HCT: 28.2 % — ABNORMAL LOW (ref 39.0–52.0)
Hemoglobin: 8.8 g/dL — ABNORMAL LOW (ref 13.0–17.0)
Immature Granulocytes: 5 %
Lymphocytes Relative: 11 %
Lymphs Abs: 0.9 10*3/uL (ref 0.7–4.0)
MCH: 32.4 pg (ref 26.0–34.0)
MCHC: 31.2 g/dL (ref 30.0–36.0)
MCV: 103.7 fL — ABNORMAL HIGH (ref 80.0–100.0)
Monocytes Absolute: 1 10*3/uL (ref 0.1–1.0)
Monocytes Relative: 12 %
Neutro Abs: 6 10*3/uL (ref 1.7–7.7)
Neutrophils Relative %: 72 %
Platelets: 186 10*3/uL (ref 150–400)
RBC: 2.72 MIL/uL — ABNORMAL LOW (ref 4.22–5.81)
RDW: 15 % (ref 11.5–15.5)
WBC: 8.4 10*3/uL (ref 4.0–10.5)
nRBC: 0.4 % — ABNORMAL HIGH (ref 0.0–0.2)

## 2018-03-31 LAB — COMPREHENSIVE METABOLIC PANEL
ALT: 20 U/L (ref 0–44)
AST: 21 U/L (ref 15–41)
Albumin: 2.5 g/dL — ABNORMAL LOW (ref 3.5–5.0)
Alkaline Phosphatase: 44 U/L (ref 38–126)
Anion gap: 8 (ref 5–15)
BUN: 34 mg/dL — ABNORMAL HIGH (ref 8–23)
CO2: 22 mmol/L (ref 22–32)
Calcium: 7.3 mg/dL — ABNORMAL LOW (ref 8.9–10.3)
Chloride: 110 mmol/L (ref 98–111)
Creatinine, Ser: 1.43 mg/dL — ABNORMAL HIGH (ref 0.61–1.24)
GFR calc Af Amer: 52 mL/min — ABNORMAL LOW (ref >60)
GFR calc non Af Amer: 45 mL/min — ABNORMAL LOW (ref >60)
Glucose, Bld: 87 mg/dL (ref 70–99)
Potassium: 4.3 mmol/L (ref 3.5–5.1)
Sodium: 140 mmol/L (ref 135–145)
Total Bilirubin: 0.4 mg/dL (ref 0.3–1.2)
Total Protein: 4.6 g/dL — ABNORMAL LOW (ref 6.5–8.1)

## 2018-03-31 LAB — MAGNESIUM: Magnesium: 1.9 mg/dL (ref 1.7–2.4)

## 2018-03-31 LAB — PHOSPHORUS: Phosphorus: 2.6 mg/dL (ref 2.5–4.6)

## 2018-03-31 MED ORDER — OXYCODONE HCL 5 MG PO TABS
10.0000 mg | ORAL_TABLET | ORAL | Status: DC | PRN
  Administered 2018-03-31 – 2018-04-02 (×8): 10 mg via ORAL
  Filled 2018-03-31 (×8): qty 2

## 2018-03-31 MED ORDER — OXYCODONE HCL 5 MG PO TABS
5.0000 mg | ORAL_TABLET | ORAL | Status: DC | PRN
  Filled 2018-03-31: qty 1

## 2018-03-31 NOTE — Progress Notes (Signed)
Subjective: 3 Days Post-Op Procedure(s) (LRB): AMPUTATION BELOW KNEE (Right) Patient reports pain as moderate.    Objective: Vital signs in last 24 hours: Temp:  [97.6 F (36.4 C)-98.2 F (36.8 C)] 97.6 F (36.4 C) (01/20 0400) Pulse Rate:  [56-64] 64 (01/19 1938) Resp:  [16] 16 (01/19 1611) BP: (111-142)/(63-83) 142/83 (01/20 0400) SpO2:  [94 %-99 %] 99 % (01/20 0018)  Intake/Output from previous day: 01/19 0701 - 01/20 0700 In: 60 [P.O.:60] Out: 850 [Urine:850] Intake/Output this shift: No intake/output data recorded.  Recent Labs    03/29/18 0746 03/30/18 0619 03/31/18 0601  HGB 8.9* 8.8* 8.8*   Recent Labs    03/30/18 0619 03/31/18 0601  WBC 10.4 8.4  RBC 2.73* 2.72*  HCT 28.4* 28.2*  PLT 197 186   Recent Labs    03/30/18 0619 03/31/18 0601  NA 140 140  K 4.3 4.3  CL 107 110  CO2 23 22  BUN 34* 34*  CREATININE 1.54* 1.43*  GLUCOSE 99 87  CALCIUM 7.4* 7.3*   No results for input(s): LABPT, INR in the last 72 hours.  right below the knee amputation with VAC dressing in place and functioning well. No drainage in VAC canister.      Assessment/Plan: 3 Days Post-Op Procedure(s) (LRB): AMPUTATION BELOW KNEE (Right) Will need CIR or SNF rehab following acute hospitalization.  Wife very anxious about DC plan.  Encouraged patient to maintain full knee extension. Wearing stump protector.     Ching Rabideau 03/31/2018, 8:17 AM  [place]

## 2018-03-31 NOTE — Progress Notes (Addendum)
PROGRESS NOTE    [name]   KPT:[id number]  DOB: 1933/11/09  DOA: 03/24/2018 PCP: [name], MD   Brief Narrative:  [name]  is an 85 year old male with history of stroke, h/o absence seizures, A. fib off anticoagulation for past 6-7 months due to lower extremity hematoma secondary to fall, seizure disorder, chronic kidney disease stage III, chronic diastolic CHF, OSA, CAD , chronic right leg wound (managed at wound care center, Dr Dellia Nims) who was hospitalized from 1/4-1/8 with sepsis secondary to cellulitis of the right leg.  He was brought in by his wife on 1/13 for "sluggishness for a few days" and a staring spell. Neuro was concerned about a seizure. Depakote had been decreased recently apparently as seizures had been controlled.  In ED> Depakote level 37- given a loading dose of Depakote IV. An MRI subsequently revealed a Right cerebellar punctate acute infarct. Stroke team consulted.  1/17- ortho consult requested for right LE chronic ulcer with osteomyelitis of distal fibula. Dr [name] recommended a right transtibial amputation and he underwent surgery the same day.   Subjective: Complains of pain in his left testicle which has been on and off for 1 month now. No swelling noted- he and his wife feel the pain is related to the position he is in while in bed with his scrotum in between his legs. He is also constipated for 4 days. No other complaints.     Assessment & Plan:   Principal Problem:   Altered mental status- ? Post ictal state after an absence seizure - seizure disorder - has been evaluated by neuro and is recommended to continue Depakote 250 in AM and 500 in PM and not to follow the Depakote levels-  Active Problems: Acute CVA- incidental finding and no physical finding to coincide with the site of the infarct - right cerebellar infarct noted on MRI- ? Embolic due to A-fib vs iscemic - 2 D ECHO did not show a thrombus -  CTA head/neck> no significant  stenosis - cardiology consulted today- they not feel his Eliquis should be resumed due to the prior spontaneous hematoma - cont Pravastatin and ASA 325 mg daily  Chronic A-fib- short lived episode of NSVT on 1/17, small sinus pause on 1/19 (2.35 sec) - cont ASA as mentioned above - cont Metoprolol and Cardizem  Cellulitis and osteomyelitis of RLE - s/p Transtibial Amputation by Dr [name] on 03/28/18 - has wound vac- needs f/u with Dr [name] 1 wk after above surgery (which would be later this week) - awaiting CIR or SNF placement  Chronic d CHF - Furosemide is on hold as Cr was rising when it was being given- will follow for resumption  Chronic anemia -stable     Hypothyroid - Synthroid  Chronic severe arthritis -cont  Prednisone 10 mg, Oxycodone and Lidocaine patch     CKD (chronic kidney disease) stage 3, GFR 30-59 ml/min (HCC)  - stable - follow   Left testicle pain - no acute issues noted on exam- not tender or swollen- they feel his scrotum is being crushed while sitting in a v shape in the bed- advised to get OOB daily with RN or PT assistance    Time spent in minutes: 45 DVT prophylaxis: heparin Code Status: Full code Family Communication: wife at bedside Disposition Plan: SNF vs CIR when bed is available Consultants:   Neuro  Ortho  Cardiology  Procedures:    Bilateral LE Venous Duplex  -Right: There is  no evidence of deep vein thrombosis in the lower extremity. No cystic structure found in the popliteal fossa.  Left: There is no evidence of deep vein thrombosis in the lower extremity. No cystic structure found in the popliteal fossa.    EEG:  EEG Abnormalities:None    Transtibial amputation- right  Antimicrobials:  Anti-infectives (From admission, onward)   Start     Dose/Rate Route Frequency Ordered Stop   03/28/18 1200  ceFAZolin (ANCEF) IVPB 2g/100 mL premix  Status:  Discontinued     2 g 200 mL/hr over 30 Minutes Intravenous To ShortStay  Surgical 03/27/18 1941 03/28/18 1450   03/27/18 1830  ceFEPIme (MAXIPIME) 1 g in sodium chloride 0.9 % 100 mL IVPB     1 g 200 mL/hr over 30 Minutes Intravenous Every 12 hours 03/27/18 1815 03/29/18 2347   03/27/18 1830  vancomycin (VANCOCIN) 1,250 mg in sodium chloride 0.9 % 250 mL IVPB     1,250 mg 166.7 mL/hr over 90 Minutes Intravenous Every 24 hours 03/27/18 1815 03/29/18 2007   03/24/18 2200  ceFEPIme (MAXIPIME) 1 g in sodium chloride 0.9 % 100 mL IVPB  Status:  Discontinued     1 g 200 mL/hr over 30 Minutes Intravenous Every 12 hours 03/24/18 1921 03/27/18 0840   03/24/18 2200  vancomycin (VANCOCIN) IVPB 1000 mg/200 mL premix  Status:  Discontinued     1,000 mg 200 mL/hr over 60 Minutes Intravenous Every 24 hours 03/24/18 1929 03/27/18 0840   03/24/18 1845  vancomycin (VANCOCIN) IVPB 1000 mg/200 mL premix  Status:  Discontinued     1,000 mg 200 mL/hr over 60 Minutes Intravenous Every 12 hours 03/24/18 1832 03/24/18 1928   03/24/18 1045  ceFEPIme (MAXIPIME) 2 g in sodium chloride 0.9 % 100 mL IVPB     2 g 200 mL/hr over 30 Minutes Intravenous  Once 03/24/18 1038 03/24/18 1129   03/24/18 1045  metroNIDAZOLE (FLAGYL) IVPB 500 mg  Status:  Discontinued     500 mg 100 mL/hr over 60 Minutes Intravenous Every 8 hours 03/24/18 1038 03/24/18 1954   03/24/18 1045  vancomycin (VANCOCIN) IVPB 1000 mg/200 mL premix     1,000 mg 200 mL/hr over 60 Minutes Intravenous  Once 03/24/18 1038 03/25/18 1829       Objective: Vitals:   03/30/18 1938 03/31/18 0018 03/31/18 0400 03/31/18 0800  BP: 111/68 131/83 (!) 142/83 126/81  Pulse: 64     Resp:      Temp: 98.2 F (36.8 C) 97.8 F (36.6 C) 97.6 F (36.4 C) 97.8 F (36.6 C)  TempSrc: Oral Oral Oral Oral  SpO2: 95% 99%    Weight:      Height:        Intake/Output Summary (Last 24 hours) at 03/31/2018 1021 Last data filed at 03/31/2018 0239 Gross per 24 hour  Intake 60 ml  Output 850 ml  Net -790 ml   Filed Weights   03/24/18 1142  03/28/18 1103  Weight: 74.8 kg 74.8 kg    Examination: General exam: Appears comfortable  HEENT: PERRLA, oral mucosa moist, no sclera icterus or thrush Respiratory system: Clear to auscultation. Respiratory effort normal. Cardiovascular system: S1 & S2 heard, IIRR.   Gastrointestinal system: Abdomen soft, non-tender, nondistended. Normal bowel sounds. Central nervous system: Alert and oriented. No focal neurological deficits. Extremities: No cyanosis, clubbing or edema- right leg in stump protector with wound vac GU: scrotum examined- no swelling or tenderness in either testicle Skin: No rashes or ulcers  Psychiatry:  Mood & affect appropriate.     Data Reviewed: I have personally reviewed following labs and imaging studies  CBC: Recent Labs  Lab 03/27/18 0549 03/28/18 0553 03/29/18 0746 03/30/18 0619 03/31/18 0601  WBC 8.6 8.3 9.8 10.4 8.4  NEUTROABS 6.0 5.8 8.9* 8.7* 6.0  HGB 9.3* 9.4* 8.9* 8.8* 8.8*  HCT 28.9* 29.5* 27.9* 28.4* 28.2*  MCV 101.0* 102.8* 102.2* 104.0* 103.7*  PLT 224 209 192 197 970   Basic Metabolic Panel: Recent Labs  Lab 03/27/18 0549 03/28/18 0553 03/29/18 0746 03/29/18 1822 03/30/18 0619 03/31/18 0601  NA 142 142 140 137 140 140  K 3.7 3.7 4.2 4.6 4.3 4.3  CL 107 107 105 105 107 110  CO2 24 25 23 22 23 22   GLUCOSE 78 81 122* 160* 99 87  BUN 18 18 27* 32* 34* 34*  CREATININE 1.26* 1.44* 1.68* 1.87* 1.54* 1.43*  CALCIUM 8.2* 8.1* 7.7* 7.5* 7.4* 7.3*  MG 1.7 1.9 1.8  --  1.9 1.9  PHOS 3.1 3.8 4.3  --  3.8 2.6   GFR: Estimated Creatinine Clearance: 39.8 mL/min (A) (by C-G formula based on SCr of 1.43 mg/dL (H)). Liver Function Tests: Recent Labs  Lab 03/27/18 0549 03/28/18 0553 03/29/18 0746 03/30/18 0619 03/31/18 0601  AST 28 28 23 22 21   ALT 29 29 25 21 20   ALKPHOS 52 51 52 50 44  BILITOT 0.8 0.8 0.9 0.4 0.4  PROT 5.0* 5.0* 5.3* 4.8* 4.6*  ALBUMIN 2.7* 2.7* 2.8* 2.7* 2.5*   No results for input(s): LIPASE, AMYLASE in the last  168 hours. No results for input(s): AMMONIA in the last 168 hours. Coagulation Profile: Recent Labs  Lab 03/24/18 1026  INR 1.16   Cardiac Enzymes: No results for input(s): CKTOTAL, CKMB, CKMBINDEX, TROPONINI in the last 168 hours. BNP (last 3 results) Recent Labs    09/30/17 1107 10/22/17 1120 11/22/17 1130  PROBNP 3,272* 3,243* 3,962*   HbA1C: No results for input(s): HGBA1C in the last 72 hours. CBG: Recent Labs  Lab 03/25/18 0645  GLUCAP 71   Lipid Profile: No results for input(s): CHOL, HDL, LDLCALC, TRIG, CHOLHDL, LDLDIRECT in the last 72 hours. Thyroid Function Tests: No results for input(s): TSH, T4TOTAL, FREET4, T3FREE, THYROIDAB in the last 72 hours. Anemia Panel: No results for input(s): VITAMINB12, FOLATE, FERRITIN, TIBC, IRON, RETICCTPCT in the last 72 hours. Urine analysis:    Component Value Date/Time   COLORURINE COLORLESS (A) 03/26/2018 1420   APPEARANCEUR CLEAR 03/26/2018 1420   LABSPEC 1.006 03/26/2018 1420   PHURINE 6.0 03/26/2018 1420   GLUCOSEU NEGATIVE 03/26/2018 1420   HGBUR NEGATIVE 03/26/2018 1420   BILIRUBINUR NEGATIVE 03/26/2018 1420   KETONESUR NEGATIVE 03/26/2018 1420   PROTEINUR NEGATIVE 03/26/2018 1420   UROBILINOGEN 1.0 09/16/2007 1212   NITRITE NEGATIVE 03/26/2018 1420   LEUKOCYTESUR NEGATIVE 03/26/2018 1420   Sepsis Labs: @LABRCNTIP (procalcitonin:4,lacticidven:4) ) Recent Results (from the past 240 hour(s))  Culture, blood (Routine x 2)     Status: None   Collection Time: 03/24/18 10:27 AM  Result Value Ref Range Status   Specimen Description   Final    BLOOD LEFT ANTECUBITAL Performed at [facility], [place] [address], [place], [place] [address]    Special Requests   Final    BOTTLES DRAWN AEROBIC AND ANAEROBIC Blood Culture adequate volume Performed at [facility] [address] [place], [place] [address]    Culture   Final    NO GROWTH  5 DAYS Performed at [place], [place] [address] [place], [place] [address]    Report Status 03/29/2018 FINAL  Final  Culture, blood (Routine x 2)     Status: None   Collection Time: 03/24/18 10:28 AM  Result Value Ref Range Status   Specimen Description   Final    BLOOD RIGHT HAND Performed at [facility] [address], [place], [place] [address]    Special Requests   Final    BOTTLES DRAWN AEROBIC AND ANAEROBIC Blood Culture results may not be optimal due to an excessive volume of blood received in culture bottles Performed at [facility] [address] [place], [place] [address]    Culture   Final    NO GROWTH 5 DAYS Performed at [facility], [place] [address], [place],  [address]    Report Status 03/29/2018 FINAL  Final  Urine Culture     Status: None   Collection Time: 03/26/18  2:51 PM  Result Value Ref Range Status   Specimen Description URINE, RANDOM  Final   Special Requests NONE  Final   Culture   Final    NO GROWTH Performed at [facility], [place] [address] [place],  [address]    Report Status 03/27/2018 FINAL  Final  Surgical PCR screen     Status: Abnormal   Collection Time: 03/27/18  8:44 PM  Result Value Ref Range Status   MRSA, PCR POSITIVE (A) NEGATIVE Final    Comment: RESULT CALLED TO, READ BACK BY AND VERIFIED WITH: I.ESSIEN AKPAN,RN AT 2347 03/27/18,BY L.PITT    Staphylococcus aureus POSITIVE (A) NEGATIVE Final    Comment: (NOTE) The Xpert SA Assay (FDA approved for NASAL specimens in patients 2 years of age and older), is one component of a comprehensive surveillance program. It is not intended to diagnose infection nor to guide or monitor treatment.          Radiology Studies: No results found.    Scheduled Meds: .  stroke: mapping our early stages of recovery book   Does not apply Once  . aspirin EC  325 mg Oral Daily  . diltiazem  300 mg Oral Daily  . divalproex  250 mg Oral q morning - 10a  .  divalproex  500 mg Oral QHS  . docusate sodium  100 mg Oral BID  . doxazosin  4 mg Oral QPM  . ezetimibe  5 mg Oral QHS  . heparin injection (subcutaneous)  5,000 Units Subcutaneous Q8H  . levothyroxine  150 mcg Oral QAC breakfast  . metoprolol tartrate  25 mg Oral BID PC  . mupirocin ointment  1 application Nasal BID  . pantoprazole  40 mg Oral Daily  . polyethylene glycol  17 g Oral BID  . pravastatin  40 mg Oral QPM  . predniSONE  10 mg Oral Q breakfast  . senna-docusate  1 tablet Oral BID   Continuous Infusions: . methocarbamol (ROBAXIN) IV       LOS: 6 days      Debbe Odea, MD [facility] Pager: [email or web address] Password [facility] 03/31/2018, 10:21 AM

## 2018-03-31 NOTE — Progress Notes (Signed)
Physical Therapy Treatment Patient Details Name: [name] MRN: [id number] DOB: [birth year] Today's Date: 03/31/2018    History of Present Illness Patient is a 85 y/o male who presents with AMS and worsening RLE infection. Admitted with encephalopathy and possible absence seizures. 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  1/17 R transtibial amputation by Dr. [name].  PMH includes A-fib, CAD, CHF, gait abnormality, vertigo, seizure, cervical fusion, RLE wound, memory deficits.     PT Comments    Pt eager to assist due to it was important to go to the bathroom.  Pt transferred to [facility] with face to face assist then stood several times for peri care and standing trials using the STEDY.   Follow Up Recommendations  CIR     Equipment Recommendations  None recommended by PT    Recommendations for Other Services       Precautions / Restrictions Precautions Precautions: Fall Precaution Comments: R transtibial amputation with residual limb protecter    Mobility  Bed Mobility Overal bed mobility: Needs Assistance Bed Mobility: Sit to Supine       Sit to supine: Mod assist   General bed mobility comments: cues for direction and assist legs up into bed.  Pt assisted repositioning with L LE  Transfers Overall transfer level: Needs assistance Equipment used: None Transfers: Sit to/from [employer] Sit to Stand: Mod assist;+2 physical assistance(x4)   Squat pivot transfers: Max assist     General transfer comment: Used Stedy to stand x3 and 1 time face to face.  cues for direction and hand placement and significant boost assist whether face to face or in the [facility]  Ambulation/Gait                 Stairs             Wheelchair Mobility    Modified Rankin (Stroke Patients Only) Modified Rankin (Stroke Patients Only) Pre-Morbid Rankin Score: Moderately severe disability Modified  Rankin: Moderately severe disability     Balance     Sitting balance-Leahy Scale: Fair Sitting balance - Comments: prefers to use UE     Standing balance-Leahy Scale: Poor                              Cognition Arousal/Alertness: Awake/alert Behavior During Therapy: WFL for tasks assessed/performed Overall Cognitive Status: Impaired/Different from baseline Area of Impairment: Following commands;Safety/judgement;Problem solving                       Following Commands: Follows one step commands with increased time Safety/Judgement: Decreased awareness of deficits;Decreased awareness of safety   Problem Solving: Slow processing;Decreased initiation;Difficulty sequencing;Requires verbal cues;Requires tactile cues        Exercises      General Comments General comments (skin integrity, edema, etc.): wife present and encouraging to patient.  Therapy was successful, but limited to transfers and standing around toileting and pericare.  No exercise completed today.      Pertinent Vitals/Pain Pain Assessment: Faces Faces Pain Scale: Hurts a little bit Pain Location: R LE Pain Descriptors / Indicators: Sore Pain Intervention(s): Monitored during session    Home Living                      Prior Function            PT  Goals (current goals can now be found in the care plan section) Acute Rehab PT Goals Patient Stated Goal: to get out of bed PT Goal Formulation: With patient Time For Goal Achievement: 04/08/18 Potential to Achieve Goals: Good Progress towards PT goals: Progressing toward goals    Frequency    Min 5X/week      PT Plan Current plan remains appropriate    Co-evaluation              AM-PAC PT "6 Clicks" Mobility   Outcome Measure  Help needed turning from your back to your side while in a flat bed without using bedrails?: A Little Help needed moving from lying on your back to sitting on the side of a flat bed  without using bedrails?: A Lot Help needed moving to and from a bed to a chair (including a wheelchair)?: A Lot Help needed standing up from a chair using your arms (e.g., wheelchair or bedside chair)?: A Lot Help needed to walk in hospital room?: Total Help needed climbing 3-5 steps with a railing? : Total 6 Click Score: 11    End of Session   Activity Tolerance: Patient limited by fatigue;Patient tolerated treatment well Patient left: in bed;with call bell/phone within reach;with bed alarm set;with family/visitor present Nurse Communication: Mobility status PT Visit Diagnosis: Other abnormalities of gait and mobility (R26.89);Muscle weakness (generalized) (M62.81);Pain Pain - Right/Left: Right Pain - part of body: Leg     Time: 9179-1505 PT Time Calculation (min) (ACUTE ONLY): 31 min  Charges:  $Therapeutic Activity: 23-37 mins                     03/31/2018  Donnella Sham, PT Acute Rehabilitation Services [phone]  (pager) [phone]  (office)   Tessie Fass Kenrick Pore 03/31/2018, 5:23 PM

## 2018-03-31 NOTE — Consult Note (Signed)
CARDIOLOGY CONSULT NOTE  Patient ID: [name] MRN: [id number] DOB/AGE: 1933-05-11 85 y.o.  Admit date: 03/24/2018 Primary Physician [name], MD Primary Cardiologist    Skeet Latch, MD Chief Complaint  Atrial fib.   Requesting  Dr. Wynelle Cleveland  HPI:  The patient has had a complicated recent hospitalization.  He has had cellulitis of the right leg treated earlier this month. Admitted most recently with some AMS.  Found to have probable osteomyelitis of the fibula.  How status post transtibial amputation on 1/17.   He is frail and has multiple other acute and chronic conditions as below.    We are called to comment on continued anticoagulation or not.  He has permanent atrial fib but has been off of anticoagulation after a spontaneous hematoma of the left several months ago.  He has been quite frail for about a year having battled some pneumonias.  He was apparently getting around in a chair was able to do some walking with a walker though he is recent nonhealing leg ulcer.  He has not had any recent cardiovascular symptoms such as chest pressure, neck or arm discomfort.  He has had no palpitations, presyncope or syncope.  Of note he has chronic diastolic heart failure.  Has had some issues of feeling a little trouble taking breaths recently.  However, when diuresed he had a rise in his creatinine so he is actually had his diuretics held and has been given a little fluid back.  Past Medical History:  Diagnosis Date  . Abnormality of gait 09/22/2015  . Arthritis   . Atrial fibrillation ([place])   . CAD (coronary artery disease)    Stent to RCA, Penta stent, 99% reduced to 0% 2002.  . Cancer ([place])    skin CA removed from back  . Chronic insomnia 03/30/2015  . Complication of anesthesia    trouble waking up  . Constipation    from medications taken  . GERD (gastroesophageal reflux disease)   . Hypercholesteremia   . Hypertension   . Hypothyroidism   . Memory difficulty  09/22/2015  . Osteoarthritis   . Seizures ([place])   . Sepsis ([place]) 05/2017  . Transient alteration of awareness 03/30/2015  . Vertigo    hx of    Past Surgical History:  Procedure Laterality Date  . AMPUTATION Right 03/28/2018   Procedure: AMPUTATION BELOW KNEE;  Surgeon: Newt Minion, MD;  Location: [facility];  Service: Orthopedics;  Laterality: Right;  . BACK SURGERY    . EYE SURGERY     Bilateral Cataract surgery   . HERNIA REPAIR    . I&D EXTREMITY Right 05/10/2015   Procedure: IRRIGATION AND DEBRIDEMENT EXTREMITY;  Surgeon: Leanora Cover, MD;  Location: [facility];  Service: Orthopedics;  Laterality: Right;  . KNEE ARTHROPLASTY     right knee X 2; left knee once  . LAMINECTOMY     X 6  . POSTERIOR CERVICAL FUSION/FORAMINOTOMY  01/28/2012   Procedure: POSTERIOR CERVICAL FUSION/FORAMINOTOMY LEVEL 3;  Surgeon: [name], MD;  Location: [facility];  Service: Neurosurgery;  Laterality: Left;  Posterior Cervical Five-Thoracic One Fusion, Arthrodesis with LEFT Cervical Seven-thoracic One Laminectomy, Foraminotomy and Resection of Synovial Cyst  . POSTERIOR CERVICAL FUSION/FORAMINOTOMY N/A 01/29/2013   Procedure: POSTERIOR CERVICAL FUSION/FORAMINOTOMY LEVEL 1 and C2-5 Posteriolateral Arthrodesis;  Surgeon: [name], MD;  Location: [facility];  Service: Neurosurgery;  Laterality: N/A;  C2-C3 Laminectomy C2-C3 posterior cervical arthrodesis  . TONSILLECTOMY  Allergies  Allergen Reactions  . Eliquis [Apixaban] Other (See Comments)    bleeding  . Demerol [Meperidine] Nausea And Vomiting  . Keppra [Levetiracetam] Other (See Comments)    Causes sleepiness, mental status changes   Medications Prior to Admission  Medication Sig Dispense Refill Last Dose  . acetaminophen (TYLENOL) 325 MG tablet Take 650 mg by mouth daily as needed for moderate pain.    Past Month at Unknown time  . acetaminophen (TYLENOL) 500 MG tablet Take 500 mg by mouth daily as needed for  moderate pain.   Past Month at Unknown time  . alendronate (FOSAMAX) 70 MG tablet Take 70 mg by mouth once a week. On Wednesday. Remain upright for 30-60 minutes.   Past Week at Unknown time  . Amino Acids-Protein Hydrolys (FEEDING SUPPLEMENT, PRO-STAT SUGAR FREE 64,) LIQD Take 30 mLs by mouth 2 (two) times daily. 900 mL 0 03/23/2018 at Unknown time  . aspirin EC 81 MG tablet Take 1 tablet (81 mg total) by mouth daily. 90 tablet 3 03/23/2018 at Unknown time  . calcium carbonate (OS-CAL) 600 MG tablet Take 600 mg by mouth daily after breakfast.    03/23/2018 at Unknown time  . [EXPIRED] cephALEXin (KEFLEX) 500 MG capsule Take 1 capsule (500 mg total) by mouth 3 (three) times daily for 7 days. 21 capsule 0 03/23/2018 at Unknown time  . cyanocobalamin 500 MCG tablet Take 500 mcg by mouth at bedtime.   03/23/2018 at Unknown time  . diclofenac sodium (VOLTAREN) 1 % GEL Apply 2 g topically daily as needed (pain).   Past Month at Unknown time  . diltiazem (CARDIZEM CD) 300 MG 24 hr capsule TAKE 1 CAPSULE(300 MG) BY MOUTH EVERY MORNING. DO NOT CRUSH (Patient taking differently: Take 300 mg by mouth daily. ) 90 capsule 3 03/23/2018 at Unknown time  . divalproex (DEPAKOTE) 250 MG DR tablet Take 250 mg by mouth every morning.   03/23/2018 at Unknown time  . divalproex (DEPAKOTE) 250 MG DR tablet Take 500 mg by mouth every evening.    03/23/2018 at Unknown time  . doxazosin (CARDURA) 4 MG tablet Take 4 mg by mouth every evening.    03/23/2018 at Unknown time  . [EXPIRED] doxycycline (VIBRA-TABS) 100 MG tablet Take 1 tablet (100 mg total) by mouth 2 (two) times daily for 7 days. 14 tablet 0 03/23/2018 at Unknown time  . ferrous sulfate 325 (65 FE) MG tablet Take 1 tablet (325 mg total) by mouth 2 (two) times daily with a meal. 60 tablet 0 03/23/2018 at Unknown time  . fluticasone (FLONASE) 50 MCG/ACT nasal spray Place 2 sprays into both nostrils daily as needed for allergies or rhinitis.   Past Month at Unknown time  .  furosemide (LASIX) 40 MG tablet Take 1 tablet (40 mg total) by mouth every morning. Or per cardiologist instructions since patient cannot be weighed 90 tablet 1 03/23/2018 at Unknown time  . HYDROcodone-acetaminophen (NORCO) 10-325 MG tablet Take 1 tablet by mouth every 4 (four) hours as needed for moderate pain. 15 tablet 0 03/24/2018 at Unknown time  . lactobacillus acidophilus & bulgar (LACTINEX) chewable tablet Chew 1 tablet by mouth 3 (three) times daily with meals. 90 tablet 0 03/23/2018 at Unknown time  . levothyroxine (SYNTHROID, LEVOTHROID) 150 MCG tablet Take 150 mcg by mouth daily before breakfast.   03/23/2018 at Unknown time  . lidocaine (LIDODERM) 5 % Place 1 patch onto the skin daily as needed. Remove & Discard patch within 12  hours or as directed by MD (Patient taking differently: Place 1 patch onto the skin daily as needed (pain). Remove & Discard patch within 12 hours or as directed by MD ) 30 patch 0 Past Month at Unknown time  . Menthol-Methyl Salicylate (MUSCLE RUB EX) Apply 1 application topically daily as needed (shoulder pain).   Past Month at Unknown time  . metoprolol tartrate (LOPRESSOR) 25 MG tablet Take 1 tablet (25 mg total) by mouth 2 (two) times daily. Take with or immediately following a meal. 180 tablet 3 03/23/2018 at 700-800 pm  . Multiple Vitamins-Minerals (CERTAVITE/ANTIOXIDANTS) TABS Take 1 tablet by mouth every evening.    03/23/2018 at Unknown time  . omeprazole (PRILOSEC) 20 MG capsule Take 20 mg by mouth daily before breakfast.    03/23/2018 at Unknown time  . potassium chloride SA (K-DUR,KLOR-CON) 20 MEQ tablet TAKE 1 TABLET BY MOUTH DAILY ALONG WITH LASIX 40MG  OR AS DIRECTED BY CARDIOLOGIST 90 tablet 3 03/23/2018 at Unknown time  . pravastatin (PRAVACHOL) 40 MG tablet Take 40 mg by mouth every evening.   2 03/23/2018 at Unknown time  . predniSONE (DELTASONE) 5 MG tablet Take 1 tablet (5 mg total) by mouth daily with breakfast. (Patient taking differently: Take 10 mg by  mouth daily with breakfast. ) 30 tablet 1 03/23/2018 at Unknown time  . sodium chloride (OCEAN) 0.65 % SOLN nasal spray Place 1 spray into both nostrils as needed for congestion.   Past Month at Unknown time  . VITAMIN D, ERGOCALCIFEROL, PO Take 1 capsule by mouth daily.    03/23/2018 at Unknown time  . ZETIA 10 MG tablet Take 5 mg by mouth at bedtime. (0.5 tablet)   03/23/2018 at Unknown time  . nitroGLYCERIN (NITROSTAT) 0.4 MG SL tablet Place 0.4 mg under the tongue every 5 (five) minutes as needed for chest pain. Max of 3 tablets   on hand   Family History  Problem Relation Age of Onset  . Hypertension Mother   . Cancer Mother   . Kidney failure Father   . Heart disease Father     Social History   Socioeconomic History  . Marital status: Married    Spouse name: [name]  . Number of children: 3  . Years of education: 85  . Highest education level: Not on file  Occupational History  . Occupation: retired Software engineer  Social Needs  . Financial resource strain: Not on file  . Food insecurity:    Worry: Not on file    Inability: Not on file  . Transportation needs:    Medical: Not on file    Non-medical: Not on file  Tobacco Use  . Smoking status: Former Research scientist (life sciences)  . Smokeless tobacco: Never Used  Substance and Sexual Activity  . Alcohol use: Yes    Comment: rare  . Drug use: No  . Sexual activity: Not Currently  Lifestyle  . Physical activity:    Days per week: Not on file    Minutes per session: Not on file  . Stress: Not on file  Relationships  . Social connections:    Talks on phone: Not on file    Gets together: Not on file    Attends religious service: Not on file    Active member of club or organization: Not on file    Attends meetings of clubs or organizations: Not on file    Relationship status: Not on file  . Intimate partner violence:    Fear of current  or ex partner: Not on file    Emotionally abused: Not on file    Physically abused: Not on file    Forced  sexual activity: Not on file  Other Topics Concern  . Not on file  Social History Narrative   Lives at home w/ his wife [name]   Patient drinks 4-5 cups of coffee daily.   Patient is right handed.      ROS:  Positive for fatigue and constipation.    As stated in the HPI and negative for all other systems.  Physical Exam: Blood pressure 122/62, pulse 64, temperature 97.8 F (36.6 C), temperature source Oral, resp. rate 16, height 5\' 9"  (1.753 m), weight 74.8 kg, SpO2 99 %.  GENERAL: Frail appearing HEENT:  Pupils equal round and reactive, fundi not visualized, oral mucosa unremarkable NECK:  No jugular venous distention, waveform within normal limits, carotid upstroke brisk and symmetric, no bruits, no thyromegaly LYMPHATICS:  No cervical, inguinal adenopathy LUNGS:  Clear to auscultation bilaterally BACK:  No CVA tenderness CHEST:  Unremarkable HEART:  PMI not displaced or sustained,S1 and S2 within normal limits, no S3, no clicks, no rubs, no murmurs ABD:  Flat, positive bowel sounds normal in frequency in pitch, no bruits, no rebound, no guarding, no midline pulsatile mass, no hepatomegaly, no splenomegaly EXT:  2 plus pulses throughout, no edema, no cyanosis no clubbing, status post right transtibial amputation SKIN:  No rashes no nodules NEURO:  Cranial nerves II through XII grossly intact, motor grossly intact throughout PSYCH:  Cognitively intact, oriented to person place and time    Labs: Lab Results  Component Value Date   BUN 34 (H) 03/31/2018   Lab Results  Component Value Date   CREATININE 1.43 (H) 03/31/2018   Lab Results  Component Value Date   NA 140 03/31/2018   K 4.3 03/31/2018   CL 110 03/31/2018   CO2 22 03/31/2018   Lab Results  Component Value Date   TROPONINI 0.07 (HH) 07/01/2017   Lab Results  Component Value Date   WBC 8.4 03/31/2018   HGB 8.8 (L) 03/31/2018   HCT 28.2 (L) 03/31/2018   MCV 103.7 (H) 03/31/2018   PLT 186 03/31/2018    Lab Results  Component Value Date   CHOL 88 03/26/2018   HDL 38 (L) 03/26/2018   LDLCALC 35 03/26/2018   TRIG 75 03/26/2018   CHOLHDL 2.3 03/26/2018   Lab Results  Component Value Date   ALT 20 03/31/2018   AST 21 03/31/2018   ALKPHOS 44 03/31/2018   BILITOT 0.4 03/31/2018    Echo:  Study Conclusions  - Left ventricle: The cavity size was normal. Wall thickness was   increased in a pattern of moderate LVH. Systolic function was   normal. The estimated ejection fraction was in the range of 55%   to 60%. Left ventricular diastolic function parameters were   normal. - Aortic valve: There was mild regurgitation. - Mitral valve: Calcified annulus. Mildly thickened leaflets .   There was mild regurgitation. Valve area by pressure half-time:   1.04 cm^2. - Left atrium: The atrium was mildly dilated. - Atrial septum: No defect or patent foramen ovale was identified.   Radiology:   CXR: 03/24/18.  IMPRESSION: Stable elevated left hemidiaphragm with minimal left basilar atelectasis. Small left pleural effusion is noted.  EKG:   Atrial fib, rate 90, axis WNL, QTC prolonged.  No acute ST T wave changes.    ASSESSMENT AND PLAN:  ATRIAL FIB:  Mr. NENO HOHENSEE has a CHA2DS2 - VASc score of 6.    Rate is controlled on beta blocker and calcium channel blocker.  I discussed the situation with patient and his wife.  He is a retired Software engineer.  He has not wanted to take Eliquis mostly because of expense.  However, there was also thoughtful consideration to his overall bleeding risk particularly given the fact that his recent problem was spontaneous bleeding.  I do not think now is the time to consider restarting the Eliquis although they clearly understand the risks benefits and his stroke risk score.  CKD III:   Creat is coming back down.  Follow.   No diuretic at this time.  CHRONIC DIASTOLIC HF:   Diuretic is currently being held secondary to fluctuating creat.  He will likely need  some chronic diuretic dose once creat returns to baseline.     CAD:  No evidence of active ischemia.     SignedMinus Breeding 03/31/2018, 2:00 PM

## 2018-03-31 NOTE — Progress Notes (Signed)
SLP Cancellation Note  Patient Details Name: [name] MRN: [id number] DOB: 1933/12/28   Cancelled treatment:        Patient refused therapy today. He is not feeling well. (Patient's wife reports he is tired and constipated). Will attempt to see at a later time.    Charlynne Cousins Tazia Illescas, MA, CCC-SLP 03/31/2018 1:48 PM

## 2018-03-31 NOTE — Progress Notes (Signed)
Inpatient Rehabilitation-Admissions Coordinator   [facility] met with pt and family as follow up from PM&R consult. AC discussed current recommendations and difference between SNF vs CIR. Family in agreement that at this time, SNF appears to be more appropriate. AC has communicated with SW regarding family's SNF preference ([facility]).   AC will sign off.   Please call if questions.   Jhonnie Garner, OTR/L  Rehab Admissions Coordinator  [phone] 03/31/2018 5:20 PM

## 2018-04-01 MED ORDER — PRO-STAT SUGAR FREE PO LIQD
30.0000 mL | Freq: Two times a day (BID) | ORAL | Status: DC
  Administered 2018-04-01 – 2018-04-02 (×4): 30 mL via ORAL
  Filled 2018-04-01 (×3): qty 30

## 2018-04-01 MED ORDER — ENSURE ENLIVE PO LIQD
237.0000 mL | ORAL | Status: DC
  Administered 2018-04-01 – 2018-04-02 (×2): 237 mL via ORAL
  Filled 2018-04-01: qty 237

## 2018-04-01 NOTE — Progress Notes (Signed)
Patient stated that he did not have to do what I said or follow what I do.

## 2018-04-01 NOTE — Progress Notes (Signed)
Suicide nurse available and electrical outlets taped, personal belongings secured in closet

## 2018-04-01 NOTE — Progress Notes (Signed)
MD in room while vitals taken and 1000 BP meds given

## 2018-04-01 NOTE — NC FL2 (Signed)
[place] LEVEL OF CARE SCREENING TOOL     IDENTIFICATION  Patient Name: [name] Birthdate: 29-Feb-1934 Sex: male Admission Date (Current Location): 03/24/2018  [facility] and [place] Number:  Herbalist and Address:  [facility] [place] [address], [place], [place] [address]      Provider Number: [id number]  Attending Physician Name and Address:  Debbe Odea, MD  Relative Name and Phone Number:       Current Level of Care: Hospital Recommended Level of Care: [place] Prior Approval Number:    Date Approved/Denied:   PASRR Number: [id number]  Discharge Plan:      Current Diagnoses: Patient Active Problem List   Diagnosis Date Noted  . AKI (acute kidney injury) ([place])   . S/P BKA (below knee amputation) unilateral, right ([place])   . Unilateral complete BKA, right, initial encounter ([place])   . Tachypnea   . Atrial fibrillation with rapid ventricular response ([place])   . Post-operative pain   . Subacute osteomyelitis, right ankle and foot ([place])   . Paralysis of right lower extremity ([place])   . TIA (transient ischemic attack) 03/24/2018  . Encephalopathy 03/24/2018  . Cellulitis 03/15/2018  . Hypernatremia 03/15/2018  . Persistent atrial fibrillation 11/10/2017  . Coronary artery disease involving native coronary artery of native heart without angina pectoris 11/10/2017  . Severe muscle deconditioning 11/10/2017  . Hemorrhage 10/22/2017  . CAD S/P percutaneous coronary angioplasty 10/22/2017  . Acute hypoxemic respiratory failure ([place]) 07/17/2017  . Aspiration syndrome, subsequent encounter 07/11/2017  . Aspiration pneumonia ([place]) 07/01/2017  . Acute encephalopathy 07/01/2017  . Thrombocytopenia ([place]) 07/01/2017  . Chronic diastolic (congestive) heart failure ([place]) 07/01/2017  . Anemia of chronic disease 07/01/2017  . Troponin level elevated 07/01/2017  . Chronic atrial fibrillation 07/01/2017  . Goals  of care, counseling/discussion   . Palliative care by specialist   . Acute metabolic encephalopathy 09/32/3557  . CKD (chronic kidney disease) stage 3, GFR 30-59 ml/min (HCC)   . CAP (community acquired pneumonia) 04/17/2017  . Atrial fibrillation with RVR ([place]) 04/17/2017  . Hallux rigidus, right foot 03/10/2016  . Lumbosacral spondylosis without myelopathy 10/29/2015  . Memory difficulty 09/22/2015  . Abnormality of gait 09/22/2015  . Hyperglycemia 07/06/2015  . Chronic pain 07/06/2015  . Chronic insomnia 03/30/2015  . Transient alteration of awareness 03/30/2015  . Abnormal liver function   . Altered mental status 01/31/2015  . Essential hypertension 01/31/2015  . Constipation 01/31/2015  . Hypothyroidism 01/31/2015  . Seizure disorder ([place]) 01/31/2015  . Bladder outlet obstruction 01/31/2015  . GERD (gastroesophageal reflux disease) 01/31/2015  . Chronic back pain 01/31/2015  . Acute kidney injury ([place]) 01/31/2015    Orientation RESPIRATION BLADDER Height & Weight     Self, Time, Situation, Place  Normal Continent Weight: 165 lb (74.8 kg) Height:  5\' 9"  (175.3 cm)  BEHAVIORAL SYMPTOMS/MOOD NEUROLOGICAL BOWEL NUTRITION STATUS    Convulsions/Seizures Continent Diet(see DC summary)  AMBULATORY STATUS COMMUNICATION OF NEEDS Skin   Extensive Assist Verbally Surgical wounds, Wound Vac(closed incision right leg; wound vac for one week)                       Personal Care Assistance Level of Assistance  Bathing, Feeding, Dressing Bathing Assistance: Limited assistance Feeding assistance: Independent Dressing Assistance: Limited assistance     Functional Limitations Info  Sight, Hearing, Speech Sight Info: Adequate Hearing Info: Adequate Speech Info: Adequate  SPECIAL CARE FACTORS FREQUENCY  PT (By licensed PT), OT (By licensed OT)     PT Frequency: 5x/wk OT Frequency: 5x/wk            Contractures Contractures Info: Not present    Additional Factors  Info  Code Status, Allergies Code Status Info: Full Allergies Info: Eliquis Apixaban, Demerol Meperidine, Keppra Levetiracetam           Current Medications (04/01/2018):  This is the current hospital active medication list Current Facility-Administered Medications  Medication Dose Route Frequency Provider Last Rate Last Dose  .  stroke: mapping our early stages of recovery book   Does not apply Once Newt Minion, MD      . acetaminophen (TYLENOL) solution 650 mg  650 mg Per Tube Q4H PRN Newt Minion, MD       Or  . acetaminophen (TYLENOL) suppository 650 mg  650 mg Rectal Q4H PRN Newt Minion, MD      . acetaminophen (TYLENOL) tablet 325-650 mg  325-650 mg Oral Q6H PRN Newt Minion, MD      . aspirin EC tablet 325 mg  325 mg Oral Daily Newt Minion, MD   325 mg at 03/31/18 0951  . bisacodyl (DULCOLAX) suppository 10 mg  10 mg Rectal Daily PRN Newt Minion, MD      . diclofenac sodium (VOLTAREN) 1 % transdermal gel 2 g  2 g Topical Daily PRN Newt Minion, MD      . diltiazem (CARDIZEM CD) 24 hr capsule 300 mg  300 mg Oral Daily Newt Minion, MD   300 mg at 03/31/18 0950  . divalproex (DEPAKOTE) DR tablet 250 mg  250 mg Oral q morning - 10a Newt Minion, MD   250 mg at 03/31/18 0950  . divalproex (DEPAKOTE) DR tablet 500 mg  500 mg Oral QHS Newt Minion, MD   500 mg at 03/31/18 2227  . docusate sodium (COLACE) capsule 100 mg  100 mg Oral BID Newt Minion, MD   100 mg at 03/31/18 2230  . doxazosin (CARDURA) tablet 4 mg  4 mg Oral QPM Newt Minion, MD   4 mg at 03/31/18 1904  . ezetimibe (ZETIA) tablet 5 mg  5 mg Oral QHS Newt Minion, MD   5 mg at 03/31/18 2228  . heparin injection 5,000 Units  5,000 Units Subcutaneous Q8H Newt Minion, MD   Stopped at 03/30/18 2244  . levothyroxine (SYNTHROID, LEVOTHROID) tablet 150 mcg  150 mcg Oral QAC breakfast Newt Minion, MD   150 mcg at 04/01/18 9702  . lidocaine (LIDODERM) 5 % 1 patch  1 patch Transdermal Daily PRN Newt Minion, MD   1 patch at 03/30/18 1036  . magnesium citrate solution 1 Bottle  1 Bottle Oral Once PRN Newt Minion, MD      . methocarbamol (ROBAXIN) tablet 500 mg  500 mg Oral Q6H PRN Newt Minion, MD   500 mg at 03/31/18 2353   Or  . methocarbamol (ROBAXIN) 500 mg in dextrose 5 % 50 mL IVPB  500 mg Intravenous Q6H PRN Newt Minion, MD      . metoCLOPramide (REGLAN) tablet 5-10 mg  5-10 mg Oral Q8H PRN Newt Minion, MD       Or  . metoCLOPramide (REGLAN) injection 5-10 mg  5-10 mg Intravenous Q8H PRN Newt Minion, MD      . metoprolol  tartrate (LOPRESSOR) tablet 25 mg  25 mg Oral BID PC Newt Minion, MD   25 mg at 03/31/18 1756  . mupirocin ointment (BACTROBAN) 2 % 1 application  1 application Nasal BID Newt Minion, MD   1 application at 22/02/54 2241  . ondansetron (ZOFRAN) tablet 4 mg  4 mg Oral Q6H PRN Newt Minion, MD       Or  . ondansetron [facility]) injection 4 mg  4 mg Intravenous Q6H PRN Newt Minion, MD      . oxyCODONE (Oxy IR/ROXICODONE) immediate release tablet 10 mg  10 mg Oral Q4H PRN Debbe Odea, MD   10 mg at 04/01/18 0745  . oxyCODONE (Oxy IR/ROXICODONE) immediate release tablet 5 mg  5 mg Oral Q4H PRN Debbe Odea, MD      . pantoprazole (PROTONIX) EC tablet 40 mg  40 mg Oral Daily Newt Minion, MD   40 mg at 04/01/18 0609  . polyethylene glycol (MIRALAX / GLYCOLAX) packet 17 g  17 g Oral Daily PRN Newt Minion, MD   17 g at 03/31/18 1242  . polyethylene glycol (MIRALAX / GLYCOLAX) packet 17 g  17 g Oral BID Raiford Noble [place], DO   17 g at 03/31/18 2706  . pravastatin (PRAVACHOL) tablet 40 mg  40 mg Oral QPM Newt Minion, MD   40 mg at 03/31/18 1756  . predniSONE (DELTASONE) tablet 10 mg  10 mg Oral Q breakfast Newt Minion, MD   10 mg at 03/31/18 0950  . senna-docusate (Senokot-S) tablet 1 tablet  1 tablet Oral BID Raiford Noble [place], DO   1 tablet at 03/31/18 2230     Discharge Medications: Please see discharge summary for a list of  discharge medications.  Relevant Imaging Results:  Relevant Lab Results:   Additional Information SS#: [id number]  Geralynn Ochs, LCSW

## 2018-04-01 NOTE — Progress Notes (Signed)
Initial Nutrition Assessment  DOCUMENTATION CODES:   Not applicable  INTERVENTION:  Provide 30 ml Prostat po BID, each supplement provides 100 kcal and 15 grams of protein.   Provide Ensure Enlive po once daily, each supplement provides 350 kcal and 20 grams of protein.  Encourage adequate PO intake.   NUTRITION DIAGNOSIS:   Increased nutrient needs related to wound healing as evidenced by estimated needs.  GOAL:   Patient will meet greater than or equal to 90% of their needs  MONITOR:   PO intake, Supplement acceptance, Labs, Weight trends, I & O's, Skin  REASON FOR ASSESSMENT:   Consult Assessment of nutrition requirement/status, Wound healing  ASSESSMENT:   85 year old male with history of stroke, h/o absence seizures, A. fib, seizure disorder, chronic kidney disease stage III, chronic diastolic CHF, OSA, CAD , chronic right leg wound presents with altered mental status. MRI subsequently revealed a Right cerebellar punctate acute infarct. 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   Procedure 1/17: R transtibial amputation   Meal completion has been ~50%. Wife at bedside reports pt with poor appetite and intake since admission. Pt with no significant weight loss per weight records. Wife reports pt consumes Prostat at home BID to aid in wound healing and requesting protein supplements to aid in caloric and protein needs as well as in healing. RD to order nutritional supplements. Unable to complete Nutrition-Focused physical exam at this time as pt resting during time of visit and wife requesting pt continues to rest. RD to complete physical exam at next visit.   Labs and medications reviewed.   Diet Order:   Diet Order            Diet regular Room service appropriate? Yes; Fluid consistency: Thin  Diet effective now              EDUCATION NEEDS:   Not appropriate for education at this time  Skin:  Skin Assessment: Skin Integrity  Issues: Skin Integrity Issues:: Incisions, Wound VAC Wound Vac: R leg Incisions: R leg  Last BM:  1/20  Height:   Ht Readings from Last 1 Encounters:  03/28/18 5\' 9"  (1.753 m)    Weight:   Wt Readings from Last 1 Encounters:  03/28/18 74.8 kg    Ideal Body Weight:  71.3 kg(adjusted for R transtibial amputation)  BMI:  Body mass index is 24.37 kg/m.  Estimated Nutritional Needs:   Kcal:  1850-2050  Protein:  90-105 grams  Fluid:  1.8 - 2 L/day    Corrin Parker, MS, RD, LDN Pager # [phone] After hours/ weekend pager # [phone]

## 2018-04-01 NOTE — Progress Notes (Signed)
Physical Therapy Treatment Patient Details Name: [name] MRN: [id number] DOB: [birth year] Today's Date: 04/01/2018    History of Present Illness Patient is a 85 y/o male who presents with AMS and worsening RLE infection. Admitted with encephalopathy and possible absence seizures. 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  1/17 R transtibial amputation by Dr. [name].  PMH includes A-fib, CAD, CHF, gait abnormality, vertigo, seizure, cervical fusion, RLE wound, memory deficits.     PT Comments    Pt performed supine and sidelying exercises followed by transfer to sitting edge of bed.  Pt upfront reports he is not comfortable participating with male therapist despite performing previous treatments with male therapists.  Pt continues to benefit from CIR therapies to improve strength and function before returning home.  Wife reports patient is "depressed" and speaking of regretting surgery to amputate and wanting to "go home and die."  Pt may benefit from chaplain consult and will inform nursing of need to address his feeling of hopelessness.     Follow Up Recommendations  CIR     Equipment Recommendations  None recommended by PT    Recommendations for Other Services Rehab consult     Precautions / Restrictions Precautions Precautions: Fall Precaution Comments: R transtibial amputation with residual limb protecter Restrictions Weight Bearing Restrictions: Yes RUE Weight Bearing: Non weight bearing    Mobility  Bed Mobility Overal bed mobility: Needs Assistance Bed Mobility: Supine to Sit     Supine to sit: Mod assist;+2 for physical assistance Sit to supine: Min assist   General bed mobility comments: Pt required assistance for rolling to R and progressing LEs to edge of bed and elevating trunk into sitting.  Pt required increased sitting trial edge of bed for encouragement to pariticipate with PTA/OT.  He  remains to refuse and reports.  " I can't do this today."   Transfers Overall transfer level: (Pt refused to participate because therapists were male.  )                  Ambulation/Gait                 Stairs             Wheelchair Mobility    Modified Rankin (Stroke Patients Only)       Balance     Sitting balance-Leahy Scale: Fair Sitting balance - Comments: Reliant of UE support.         Standing balance comment: refused to stand.                            Cognition Arousal/Alertness: Awake/alert Behavior During Therapy: Agitated;Flat affect("Wife reports he wants to go home and die.") Overall Cognitive Status: Impaired/Different from baseline Area of Impairment: Following commands;Safety/judgement;Problem solving                       Following Commands: Follows one step commands with increased time Safety/Judgement: Decreased awareness of deficits;Decreased awareness of safety   Problem Solving: Slow processing;Decreased initiation;Difficulty sequencing;Requires verbal cues;Requires tactile cues General Comments: Pt with self limiting behavior refusing to attempt to progress to standing with females present.  Pt required cues for re-assurance of safety during session.  Pt reports," I have heard all these promises before and I just don't feel good about this."  "You both are male and not that big."  Exercises General Exercises - Lower Extremity Long Arc Quad: AROM;Left;10 reps;Seated Hip Flexion/Marching: AROM;Left;10 reps;Seated Amputee Exercises Hip Extension: AROM;Right;10 reps;Sidelying Hip ABduction/ADduction: AROM;AAROM;Right;Supine;Sidelying;10 reps(1x10 supine and 1x10 in sidelying (AAROM for sidelying)) Hip Flexion/Marching: AROM;Right;10 reps;Seated Knee Flexion: AROM;10 reps;Right;Sidelying Knee Extension: AROM;Right;10 reps;Sidelying Straight Leg Raises: AROM;Right;10 reps;Supine    General  Comments        Pertinent Vitals/Pain Pain Assessment: Faces Faces Pain Scale: Hurts little more Pain Location: R LE Pain Descriptors / Indicators: Sore Pain Intervention(s): Monitored during session;Repositioned    Home Living                      Prior Function            PT Goals (current goals can now be found in the care plan section) Acute Rehab PT Goals Patient Stated Goal: to get out of bed Potential to Achieve Goals: Good Progress towards PT goals: Progressing toward goals    Frequency    Min 5X/week      PT Plan Current plan remains appropriate    Co-evaluation PT/OT/SLP Co-Evaluation/Treatment: Yes Reason for Co-Treatment: Complexity of the patient's impairments (multi-system involvement);Necessary to address cognition/behavior during functional activity;For patient/therapist safety          AM-PAC PT "6 Clicks" Mobility   Outcome Measure  Help needed turning from your back to your side while in a flat bed without using bedrails?: A Little Help needed moving from lying on your back to sitting on the side of a flat bed without using bedrails?: A Lot Help needed moving to and from a bed to a chair (including a wheelchair)?: A Lot Help needed standing up from a chair using your arms (e.g., wheelchair or bedside chair)?: A Lot Help needed to walk in hospital room?: Total Help needed climbing 3-5 steps with a railing? : Total 6 Click Score: 11    End of Session Equipment Utilized During Treatment: Gait belt Activity Tolerance: Patient limited by fatigue;Patient tolerated treatment well Patient left: in bed;with call bell/phone within reach;with bed alarm set;with family/visitor present Nurse Communication: Mobility status PT Visit Diagnosis: Other abnormalities of gait and mobility (R26.89);Muscle weakness (generalized) (M62.81);Pain Pain - Right/Left: Right Pain - part of body: Leg     Time: 3428-7681 PT Time Calculation (min) (ACUTE  ONLY): 34 min  Charges:  $Therapeutic Exercise: 8-22 mins                     [name], PTA Acute Rehabilitation Services Pager [phone] Office [phone]     [name] [name] 04/01/2018, 10:53 AM

## 2018-04-01 NOTE — Progress Notes (Signed)
SLP Cancellation Note  Patient Details Name: [name] MRN: [id number] DOB: Jun 08, 1933   Cancelled treatment:       Reason Eval/Treat Not Completed: Attempted x2 to see pt. Patient unavailable both times. Will continue efforts.  [facility], CCC-SLP Speech Language Pathologist [phone]  Shonna Chock 04/01/2018, 2:30 PM

## 2018-04-01 NOTE — Progress Notes (Signed)
The only part of the assessment that is completed is what I could see. Patient refused to follow commands and would not let me touch him at all. He states he doesn't feel comfortable here and with the staff

## 2018-04-01 NOTE — Progress Notes (Signed)
Subjective: 4 Days Post-Op Procedure(s) (LRB): AMPUTATION BELOW KNEE (Right) Patient reports pain as mild.    Objective: Vital signs in last 24 hours: Temp:  [97.6 F (36.4 C)-98.3 F (36.8 C)] 98.3 F (36.8 C) (01/21 0737) Pulse Rate:  [69-78] 77 (01/21 0737) Resp:  [18] 18 (01/21 0737) BP: (110-143)/(62-89) 143/89 (01/21 0737) SpO2:  [93 %-100 %] 97 % (01/21 0737)  Intake/Output from previous day: 01/20 0701 - 01/21 0700 In: 240 [P.O.:240] Out: 651 [Urine:650; Stool:1] Intake/Output this shift: No intake/output data recorded.  Recent Labs    03/30/18 0619 03/31/18 0601  HGB 8.8* 8.8*   Recent Labs    03/30/18 0619 03/31/18 0601  WBC 10.4 8.4  RBC 2.73* 2.72*  HCT 28.4* 28.2*  PLT 197 186   Recent Labs    03/30/18 0619 03/31/18 0601  NA 140 140  K 4.3 4.3  CL 107 110  CO2 23 22  BUN 34* 34*  CREATININE 1.54* 1.43*  GLUCOSE 99 87  CALCIUM 7.4* 7.3*   No results for input(s): LABPT, INR in the last 72 hours.  The right below the knee amputation site with VAC dressing in place and functioning well. Scant drainage in the canister.  Mood very depressed. Wife plans to discuss with the hospitalist.  Will ask nutrition to see for protein supplement as he was taking at home.     Assessment/Plan: 4 Days Post-Op Procedure(s) (LRB): AMPUTATION BELOW KNEE (Right) Plan for SNF Rehab following discharge.  Will need Prevena VAC for 1 week following discharge to SNF. Follow up in the office 1 week following discharge from acute hospital.     [facility], PA-C 04/01/2018, 7:58 AM  [phone]

## 2018-04-01 NOTE — Progress Notes (Signed)
Occupational Therapy Treatment Patient Details Name: [name] MRN: [id number] DOB: Jan 23, 1934 Today's Date: 04/01/2018    History of present illness Patient is a 85 y/o male who presents with AMS and worsening RLE infection. Admitted with encephalopathy and possible absence seizures. 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  1/17 R transtibial amputation by Dr. [name].  PMH includes A-fib, CAD, CHF, gait abnormality, vertigo, seizure, cervical fusion, RLE wound, memory deficits.    OT comments  Pt progressed from supine to eob sitting this session but declined to transfer to chair with fear limiting. Wife expressed concerns for depression and need for peer to peer support regarding amputation. Wife educated on support group offered within [facility]. Pt with pending d/c so acute peer to peer likely unable to set up prior to discharge.    Follow Up Recommendations  SNF    Equipment Recommendations  3 in 1 bedside commode;Wheelchair cushion (measurements OT);Wheelchair (measurements OT);Hospital bed;Other (comment)(lift)    Recommendations for Other Services      Precautions / Restrictions Precautions Precautions: Fall Precaution Comments: R transtibial amputation with residual limb protecter Restrictions Weight Bearing Restrictions: Yes RUE Weight Bearing: Non weight bearing       Mobility Bed Mobility Overal bed mobility: Needs Assistance Bed Mobility: Supine to Sit     Supine to sit: Mod assist;+2 for physical assistance Sit to supine: Min assist   General bed mobility comments: Pt required assistance for rolling to R and progressing LEs to edge of bed and elevating trunk into sitting.  Pt required increased sitting trial edge of bed for encouragement to pariticipate with PTA/OT.  He remains to refuse and reports.  " I can't do this today."   Transfers Overall transfer level: (Pt refused to participate  because therapists were male.  )               General transfer comment: declined     Balance Overall balance assessment: Needs assistance Sitting-balance support: Bilateral upper extremity supported;Feet supported Sitting balance-Leahy Scale: Fair Sitting balance - Comments: reliant on BIL UE support for balance       Standing balance comment: refused to stand.                           ADL either performed or assessed with clinical judgement   ADL Overall ADL's : Needs assistance/impaired     Grooming: Set up;Bed level Grooming Details (indicate cue type and reason): pt shaving face with electric razor on arrival. pt very fixated on task and unable to divide attention between task and talking to therapist.                        Toileting - Clothing Manipulation Details (indicate cue type and reason): incontinent of bladder and lacks awareness. pt with brief doff during session        General ADL Comments: pt progressed from supine to sitting but refused to progress. pt offered lateral transfer to chair with scooting and declined. pt expressed desire for male staff for all lifting. OT attempting to explain to wife and patient that OT staff are male and offered to utilize stedy to help with confidence during transfer. pt further declined.      Vision       Perception     Praxis      Cognition Arousal/Alertness: Awake/alert  Behavior During Therapy: Agitated;Flat affect("Wife reports he wants to go home and die.") Overall Cognitive Status: Impaired/Different from baseline Area of Impairment: Following commands;Safety/judgement;Problem solving                       Following Commands: Follows one step commands with increased time Safety/Judgement: Decreased awareness of deficits;Decreased awareness of safety   Problem Solving: Slow processing;Decreased initiation;Difficulty sequencing;Requires verbal cues;Requires tactile  cues General Comments: Pt with self limiting behavior refusing to attempt to progress to standing with females present.  Pt required cues for re-assurance of safety during session.  Pt reports," I have heard all these promises before and I just don't feel good about this."  "You both are male and not that big." pt verbally defensive during session making statements like "do you not speak english" when staff attempting to meet patients needs. pt with incontinence and lack of awareness. pt flat affect. wife reports patient has expressed desire to go home and "die" to her for two days. wife states "i think he is depressedSystems analyst Exercises - Lower Extremity Long Arc Quad: AROM;Left;10 reps;Seated Hip Flexion/Marching: AROM;Left;10 reps;Seated Amputee Exercises Hip Extension: AROM;Right;10 reps;Sidelying Hip ABduction/ADduction: AROM;AAROM;Right;Supine;Sidelying;10 reps(1x10 supine and 1x10 in sidelying (AAROM for sidelying)) Hip Flexion/Marching: AROM;Right;10 reps;Seated Knee Flexion: AROM;10 reps;Right;Sidelying Knee Extension: AROM;Right;10 reps;Sidelying Straight Leg Raises: AROM;Right;10 reps;Supine Other Exercises Other Exercises: pt agreeable to bed level exercise with encouragement of PTA / OT. pt completed exercises. pt at EOB insisted on knee extension and hip flexion exercises.    Shoulder Instructions       General Comments      Pertinent Vitals/ Pain       Pain Assessment: Faces Faces Pain Scale: Hurts little more Pain Location: R LE Pain Descriptors / Indicators: Sore Pain Intervention(s): Monitored during session;Repositioned  Home Living                                          Prior Functioning/Environment              Frequency  Min 2X/week        Progress Toward Goals  OT Goals(current goals can now be found in the care plan section)  Progress towards OT goals: Not progressing toward goals - comment  Acute Rehab  OT Goals Patient Stated Goal: to get out of bed OT Goal Formulation: With patient/family Time For Goal Achievement: 04/09/18 Potential to Achieve Goals: Good ADL Goals Pt Will Perform Upper Body Bathing: with set-up;with supervision;sitting Pt Will Perform Upper Body Dressing: with supervision;with set-up;sitting Pt Will Transfer to Toilet: with min guard assist;squat pivot transfer;stand pivot transfer;bedside commode Pt/caregiver will Perform Home Exercise Program: Increased strength;Both right and left upper extremity;With theraband;With written HEP provided  Plan Discharge plan needs to be updated    Co-evaluation    PT/OT/SLP Co-Evaluation/Treatment: Yes Reason for Co-Treatment: Complexity of the patient's impairments (multi-system involvement);Necessary to address cognition/behavior during functional activity;For patient/therapist safety;To address functional/ADL transfers   OT goals addressed during session: ADL's and self-care;Proper use of Adaptive equipment and DME;Strengthening/ROM      AM-PAC OT "6 Clicks" Daily Activity     Outcome Measure   Help from another person eating meals?: A Little Help from another person taking care of personal grooming?: A Little Help from another person toileting, which includes using toliet,  bedpan, or urinal?: Total Help from another person bathing (including washing, rinsing, drying)?: A Lot Help from another person to put on and taking off regular upper body clothing?: A Little Help from another person to put on and taking off regular lower body clothing?: Total 6 Click Score: 13    End of Session Equipment Utilized During Treatment: Gait belt  OT Visit Diagnosis: Unsteadiness on feet (R26.81);Other abnormalities of gait and mobility (R26.89);Muscle weakness (generalized) (M62.81) Pain - Right/Left: Right Pain - part of body: Ankle and joints of foot   Activity Tolerance Patient tolerated treatment well   Patient Left in bed;with  call bell/phone within reach;with bed alarm set;with family/visitor present;with SCD's reapplied   Nurse Communication Mobility status;Precautions;Weight bearing status        Time: 0233-4356 OT Time Calculation (min): 28 min  Charges: OT General Charges $OT Visit: 1 Visit OT Treatments $Neuromuscular Re-education: 8-22 mins   Jeri Modena, OTR/L  Acute Rehabilitation Services Pager: [phone] Office: [phone] .    Jeri Modena 04/01/2018, 11:16 AM

## 2018-04-01 NOTE — Consult Note (Signed)
   [facility] CM Inpatient Consult   04/01/2018  [name] September 15, 1933 [id number]    [facility] hospital liaison follow up.  Spoke with inpatient RNCM. [name] is on suicide watch currently. Palliative/goals of care and psych consult are pending.   Will continue to follow along for disposition plans.     [name], MSN-Ed, RN,BSN [facility] Liaison [phone]

## 2018-04-01 NOTE — Progress Notes (Addendum)
Progress Note  Patient Name: [name] Date of Encounter: 04/01/2018  Primary Cardiologist: Sanda Klein, MD   Subjective   Doing ok today. Currently eating lunch. No cardiac symptoms.   Inpatient Medications    Scheduled Meds: .  stroke: mapping our early stages of recovery book   Does not apply Once  . aspirin EC  325 mg Oral Daily  . diltiazem  300 mg Oral Daily  . divalproex  250 mg Oral q morning - 10a  . divalproex  500 mg Oral QHS  . docusate sodium  100 mg Oral BID  . doxazosin  4 mg Oral QPM  . ezetimibe  5 mg Oral QHS  . heparin injection (subcutaneous)  5,000 Units Subcutaneous Q8H  . levothyroxine  150 mcg Oral QAC breakfast  . metoprolol tartrate  25 mg Oral BID PC  . mupirocin ointment  1 application Nasal BID  . pantoprazole  40 mg Oral Daily  . polyethylene glycol  17 g Oral BID  . pravastatin  40 mg Oral QPM  . predniSONE  10 mg Oral Q breakfast  . senna-docusate  1 tablet Oral BID   Continuous Infusions: . methocarbamol (ROBAXIN) IV     PRN Meds: [DISCONTINUED] acetaminophen **OR** acetaminophen (TYLENOL) oral liquid 160 mg/5 mL **OR** acetaminophen, acetaminophen, bisacodyl, diclofenac sodium, lidocaine, magnesium citrate, methocarbamol **OR** methocarbamol (ROBAXIN) IV, metoCLOPramide **OR** metoCLOPramide (REGLAN) injection, ondansetron **OR** ondansetron (ZOFRAN) IV, oxyCODONE, oxyCODONE, polyethylene glycol   Vital Signs    Vitals:   03/31/18 2347 04/01/18 0501 04/01/18 0737 04/01/18 1130  BP: (!) 142/65 129/65 (!) 143/89   Pulse: 78 78 77   Resp: 18 18 18    Temp: 98.1 F (36.7 C) 97.7 F (36.5 C) 98.3 F (36.8 C) 98.4 F (36.9 C)  TempSrc: Oral Oral Oral Oral  SpO2: 100% 93% 97%   Weight:      Height:        Intake/Output Summary (Last 24 hours) at 04/01/2018 1242 Last data filed at 04/01/2018 0600 Gross per 24 hour  Intake 240 ml  Output 651 ml  Net -411 ml   Last 3 Weights 03/28/2018 03/24/2018 03/15/2018  Weight (lbs) 165  lb 165 lb 165 lb 5.5 oz  Weight (kg) 74.844 kg 74.844 kg 75 kg      Telemetry    Atrial fibrillation 90s- Personally Reviewed  ECG    Not performed today - Personally Reviewed  Physical Exam   GEN:  Elderly WM, in no acute distress.   Neck: No JVD Cardiac: irregularly irregular rhythms, regular rate Respiratory: Clear to auscultation bilaterally. GI: Soft, nontender, non-distended  MS: s/p rt transtibial amputation No edema; Neuro:  Nonfocal  Psych: Normal affect   Labs    Chemistry Recent Labs  Lab 03/29/18 0746 03/29/18 1822 03/30/18 0619 03/31/18 0601  NA 140 137 140 140  K 4.2 4.6 4.3 4.3  CL 105 105 107 110  CO2 23 22 23 22   GLUCOSE 122* 160* 99 87  BUN 27* 32* 34* 34*  CREATININE 1.68* 1.87* 1.54* 1.43*  CALCIUM 7.7* 7.5* 7.4* 7.3*  PROT 5.3*  --  4.8* 4.6*  ALBUMIN 2.8*  --  2.7* 2.5*  AST 23  --  22 21  ALT 25  --  21 20  ALKPHOS 52  --  50 44  BILITOT 0.9  --  0.4 0.4  GFRNONAA 37* 33* 41* 45*  GFRAA 43* 38* 48* 52*  ANIONGAP 12 10 10  8  Hematology Recent Labs  Lab 03/29/18 0746 03/30/18 0619 03/31/18 0601  WBC 9.8 10.4 8.4  RBC 2.73* 2.73* 2.72*  HGB 8.9* 8.8* 8.8*  HCT 27.9* 28.4* 28.2*  MCV 102.2* 104.0* 103.7*  MCH 32.6 32.2 32.4  MCHC 31.9 31.0 31.2  RDW 14.7 14.8 15.0  PLT 192 197 186    Cardiac EnzymesNo results for input(s): TROPONINI in the last 168 hours. No results for input(s): TROPIPOC in the last 168 hours.   BNPNo results for input(s): BNP, PROBNP in the last 168 hours.   DDimer No results for input(s): DDIMER in the last 168 hours.   Radiology    No results found.  Cardiac Studies   03/25/18  Study Conclusions  - Left ventricle: The cavity size was normal. Wall thickness was   increased in a pattern of moderate LVH. Systolic function was   normal. The estimated ejection fraction was in the range of 55%   to 60%. Left ventricular diastolic function parameters were   normal. - Aortic valve: There was  mild regurgitation. - Mitral valve: Calcified annulus. Mildly thickened leaflets .   There was mild regurgitation. Valve area by pressure half-time:   1.04 cm^2. - Left atrium: The atrium was mildly dilated. - Atrial septum: No defect or patent foramen ovale was identified.   Patient Profile     85 y.o. 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 Also recent h/o spontaneous bleeding. Cardiology consulted to weigh in on anticoagulation.   Assessment & Plan    1. Atrial Fibrillation: rate is controlled with metoprolol and Cardizem. He is asymptomatic. CHA2DS2 VASc score is 6, but per discussion yesterday plan to to hold off on anticoagulation for now, given recent h/o spontaneous bleeding. Pt understands risk of stroke w/o anticoagulation.   2. Chronic Diastolic CHF: recent echo 03/25/18 showed normal LVEF at 55-60. Volume appears stable. Diuretics have been on hold due to AKI, which is improving. Plan to restart PO diuretics prior to d/c. Continue  blocker. Keep BP controlled. Low sodium diet.   4. CKD, Stage III: SCr continues to improve, down from 1.87>>1.54>1.43 today. Continue to monitor.   5. CAD: prior RCA stent in 2002. Stable w/o CP. Continue medical therapy. ASA, metoprolol, pravastatin and zetia.   6. S/p Rt Transtibial Amputation: management per ortho  For questions or updates, please contact [place] Please consult [email or web address] for contact info under       Signed, Minus Breeding, MD  04/01/2018, 12:42 PM    History and all data above reviewed.  Patient examined.  I agree with the findings as above.  I spoke with his wife outside of the room.  The patient seems to be very depressed and not participating in rehab.  No acute complaintsThe patient exam reveals [id number]  ,  Lungs: clear  ,  Abd: Positive bowel sounds, no rebound no guarding, Ext No edema  .   All available labs, radiology testing, previous records reviewed. Agree with documented assessment and plan.   Atrial fib:  Rate stable.  OK to discontinue tele.  No change in therapy.  I would suggest low dose diuretic prior to DC if his PO at lower dose than previous (20 mg.)   [facility] will sign off.   Medication Recommendations:  As above Other recommendations (labs, testing, etc):  None Follow up as an outpatient:  Discussed with his wife and  she will call in a couple of months to arrange follow up.     Jeneen Rinks Kendrah Lovern  12:42 PM  04/01/2018

## 2018-04-01 NOTE — Progress Notes (Signed)
PROGRESS NOTE    [name]   ZOX:[id number]  DOB: 1933/12/26  DOA: 03/24/2018 PCP: Robert Battles, MD   Brief Narrative:  [name]  is an 85 year old male with history of stroke, h/o absence seizures, A. fib off anticoagulation for past 6-7 months due to lower extremity hematoma secondary to fall, seizure disorder, chronic kidney disease stage III, chronic diastolic CHF, OSA, CAD , chronic right leg wound (managed at wound care center, Dr Dellia Nims) who was hospitalized from 1/4-1/8 with sepsis secondary to cellulitis of the right leg.  He was brought in by his wife on 1/13 for "sluggishness for a few days" and a staring spell. Neuro was concerned about a seizure. Depakote had been decreased recently apparently as seizures had been controlled.  In ED> Depakote level 37- given a loading dose of Depakote IV. An MRI subsequently revealed a Right cerebellar punctate acute infarct. Stroke team consulted.  1/17- ortho consult requested for right LE chronic ulcer with osteomyelitis of distal fibula. Dr [name] recommended a right transtibial amputation and he underwent surgery the same day.   Subjective: The patient has no complaints today. I spoke with his wife outside of the room. His wife feels he is very depressed and is not wanting to participate in PT. He has stated that he just wants to go home and die. The patients states he did not trust the therapists who have come to work with him. He does not speak about being depressed. I spoke with the patient later today who then admitted to me to wanting to kill himself when he gets home.     Assessment & Plan:   Principal Problem:   Altered mental status- ? Post ictal state after an absence seizure - seizure disorder - has been evaluated by neuro and is recommended to continue Depakote 250 in AM and 500 in PM    Active Problems: Acute CVA- incidental finding and no physical finding to coincide with the site of the infarct - right  cerebellar infarct noted on MRI- ? Embolic due to A-fib vs iscemic - 2 D ECHO did not show a thrombus -  CTA head/neck> no significant stenosis - cardiology consulted in regards to whether Eliquis needs to be resumed as this point- they not feel his Eliquis should be resumed due to the prior spontaneous hematoma - cont Pravastatin and ASA 325 mg daily  Suicidal ideation - the patient is clearly depressed after his amputation- I am unable convince him not to kill himself. I will consult psychiatry and palliative care at this point.   Chronic A-fib- short lived episode of NSVT on 1/17, small sinus pause on 1/19 (2.35 sec) - cont ASA as mentioned above - cont Metoprolol and Cardizem - d/c telemetry as recommended by cardiology  Cellulitis and osteomyelitis of RLE - s/p Transtibial Amputation by Dr [name] on 03/28/18 - has wound vac- needs f/u with Dr [name] 1 wk after above surgery (which would be later this week) - awaiting CIR or SNF placement- have encouraged working with PT  Chronic d CHF - Furosemide is on hold as Cr was rising when it was being given- will follow for resumption  Chronic anemia -stable     Hypothyroid - Synthroid  Chronic severe arthritis -cont  Prednisone 10 mg, Oxycodone and Lidocaine patch     CKD (chronic kidney disease) stage 3, GFR 30-59 ml/min (HCC)  - stable - follow   Left testicle pain - no acute issues noted on  exam- not tender or swollen- they feel his scrotum is being crushed while sitting in a v shape in the bed- advised to get OOB daily with RN or PT assistance    Time spent in minutes: 45 DVT prophylaxis: they are declining heparin s/c as one of the injections caused him to have localized bleeding- SCDs ordered but these will not protect right leg Code Status: Full code Family Communication: wife at bedside Disposition Plan: cont to follow in the hospital Consultants:   Neuro  Ortho  Cardiology  Procedures:    Bilateral LE Venous  Duplex  -Right: There is no evidence of deep vein thrombosis in the lower extremity. No cystic structure found in the popliteal fossa.  Left: There is no evidence of deep vein thrombosis in the lower extremity. No cystic structure found in the popliteal fossa.    EEG:  EEG Abnormalities:None    Transtibial amputation- right  Antimicrobials:  Anti-infectives (From admission, onward)   Start     Dose/Rate Route Frequency Ordered Stop   03/28/18 1200  ceFAZolin (ANCEF) IVPB 2g/100 mL premix  Status:  Discontinued     2 g 200 mL/hr over 30 Minutes Intravenous To ShortStay Surgical 03/27/18 1941 03/28/18 1450   03/27/18 1830  ceFEPIme (MAXIPIME) 1 g in sodium chloride 0.9 % 100 mL IVPB     1 g 200 mL/hr over 30 Minutes Intravenous Every 12 hours 03/27/18 1815 03/29/18 2347   03/27/18 1830  vancomycin (VANCOCIN) 1,250 mg in sodium chloride 0.9 % 250 mL IVPB     1,250 mg 166.7 mL/hr over 90 Minutes Intravenous Every 24 hours 03/27/18 1815 03/29/18 2007   03/24/18 2200  ceFEPIme (MAXIPIME) 1 g in sodium chloride 0.9 % 100 mL IVPB  Status:  Discontinued     1 g 200 mL/hr over 30 Minutes Intravenous Every 12 hours 03/24/18 1921 03/27/18 0840   03/24/18 2200  vancomycin (VANCOCIN) IVPB 1000 mg/200 mL premix  Status:  Discontinued     1,000 mg 200 mL/hr over 60 Minutes Intravenous Every 24 hours 03/24/18 1929 03/27/18 0840   03/24/18 1845  vancomycin (VANCOCIN) IVPB 1000 mg/200 mL premix  Status:  Discontinued     1,000 mg 200 mL/hr over 60 Minutes Intravenous Every 12 hours 03/24/18 1832 03/24/18 1928   03/24/18 1045  ceFEPIme (MAXIPIME) 2 g in sodium chloride 0.9 % 100 mL IVPB     2 g 200 mL/hr over 30 Minutes Intravenous  Once 03/24/18 1038 03/24/18 1129   03/24/18 1045  metroNIDAZOLE (FLAGYL) IVPB 500 mg  Status:  Discontinued     500 mg 100 mL/hr over 60 Minutes Intravenous Every 8 hours 03/24/18 1038 03/24/18 1954   03/24/18 1045  vancomycin (VANCOCIN) IVPB 1000 mg/200 mL  premix     1,000 mg 200 mL/hr over 60 Minutes Intravenous  Once 03/24/18 1038 03/25/18 1829       Objective: Vitals:   03/31/18 2347 04/01/18 0501 04/01/18 0737 04/01/18 1130  BP: (!) 142/65 129/65 (!) 143/89   Pulse: 78 78 77   Resp: 18 18 18    Temp: 98.1 F (36.7 C) 97.7 F (36.5 C) 98.3 F (36.8 C) 98.4 F (36.9 C)  TempSrc: Oral Oral Oral Oral  SpO2: 100% 93% 97%   Weight:      Height:        Intake/Output Summary (Last 24 hours) at 04/01/2018 1409 Last data filed at 04/01/2018 0600 Gross per 24 hour  Intake 240 ml  Output 651  ml  Net -411 ml   Filed Weights   03/24/18 1142 03/28/18 1103  Weight: 74.8 kg 74.8 kg    Examination: General exam: Appears comfortable  HEENT: PERRLA, oral mucosa moist, no sclera icterus or thrush Respiratory system: Clear to auscultation. Respiratory effort normal. Cardiovascular system: S1 & S2 heard,  No murmurs  Gastrointestinal system: Abdomen soft, non-tender, nondistended. Normal bowel sound. No organomegaly Central nervous system: Alert and oriented. No focal neurological deficits. Extremities: No cyanosis, clubbing or edema- left stump in stump protector with wound vac Skin: No rashes or ulcers Psychiatry:  appears angry today    Data Reviewed: I have personally reviewed following labs and imaging studies  CBC: Recent Labs  Lab 03/27/18 0549 03/28/18 0553 03/29/18 0746 03/30/18 0619 03/31/18 0601  WBC 8.6 8.3 9.8 10.4 8.4  NEUTROABS 6.0 5.8 8.9* 8.7* 6.0  HGB 9.3* 9.4* 8.9* 8.8* 8.8*  HCT 28.9* 29.5* 27.9* 28.4* 28.2*  MCV 101.0* 102.8* 102.2* 104.0* 103.7*  PLT 224 209 192 197 562   Basic Metabolic Panel: Recent Labs  Lab 03/27/18 0549 03/28/18 0553 03/29/18 0746 03/29/18 1822 03/30/18 0619 03/31/18 0601  NA 142 142 140 137 140 140  K 3.7 3.7 4.2 4.6 4.3 4.3  CL 107 107 105 105 107 110  CO2 24 25 23 22 23 22   GLUCOSE 78 81 122* 160* 99 87  BUN 18 18 27* 32* 34* 34*  CREATININE 1.26* 1.44* 1.68*  1.87* 1.54* 1.43*  CALCIUM 8.2* 8.1* 7.7* 7.5* 7.4* 7.3*  MG 1.7 1.9 1.8  --  1.9 1.9  PHOS 3.1 3.8 4.3  --  3.8 2.6   GFR: Estimated Creatinine Clearance: 39.8 mL/min (A) (by C-G formula based on SCr of 1.43 mg/dL (H)). Liver Function Tests: Recent Labs  Lab 03/27/18 0549 03/28/18 0553 03/29/18 0746 03/30/18 0619 03/31/18 0601  AST 28 28 23 22 21   ALT 29 29 25 21 20   ALKPHOS 52 51 52 50 44  BILITOT 0.8 0.8 0.9 0.4 0.4  PROT 5.0* 5.0* 5.3* 4.8* 4.6*  ALBUMIN 2.7* 2.7* 2.8* 2.7* 2.5*   No results for input(s): LIPASE, AMYLASE in the last 168 hours. No results for input(s): AMMONIA in the last 168 hours. Coagulation Profile: No results for input(s): INR, PROTIME in the last 168 hours. Cardiac Enzymes: No results for input(s): CKTOTAL, CKMB, CKMBINDEX, TROPONINI in the last 168 hours. BNP (last 3 results) Recent Labs    09/30/17 1107 10/22/17 1120 11/22/17 1130  PROBNP 3,272* 3,243* 3,962*   HbA1C: No results for input(s): HGBA1C in the last 72 hours. CBG: No results for input(s): GLUCAP in the last 168 hours. Lipid Profile: No results for input(s): CHOL, HDL, LDLCALC, TRIG, CHOLHDL, LDLDIRECT in the last 72 hours. Thyroid Function Tests: No results for input(s): TSH, T4TOTAL, FREET4, T3FREE, THYROIDAB in the last 72 hours. Anemia Panel: No results for input(s): VITAMINB12, FOLATE, FERRITIN, TIBC, IRON, RETICCTPCT in the last 72 hours. Urine analysis:    Component Value Date/Time   COLORURINE COLORLESS (A) 03/26/2018 1420   APPEARANCEUR CLEAR 03/26/2018 1420   LABSPEC 1.006 03/26/2018 1420   PHURINE 6.0 03/26/2018 1420   GLUCOSEU NEGATIVE 03/26/2018 1420   HGBUR NEGATIVE 03/26/2018 1420   BILIRUBINUR NEGATIVE 03/26/2018 1420   KETONESUR NEGATIVE 03/26/2018 1420   PROTEINUR NEGATIVE 03/26/2018 1420   UROBILINOGEN 1.0 09/16/2007 1212   NITRITE NEGATIVE 03/26/2018 1420   LEUKOCYTESUR NEGATIVE 03/26/2018 1420   Sepsis  Labs: @LABRCNTIP (procalcitonin:4,lacticidven:4) ) Recent Results (from the past 240 hour(s))  Culture, blood (Routine x 2)     Status: None   Collection Time: 03/24/18 10:27 AM  Result Value Ref Range Status   Specimen Description   Final    BLOOD LEFT ANTECUBITAL Performed at [facility] [address] [place], [place] [address]    Special Requests   Final    BOTTLES DRAWN AEROBIC AND ANAEROBIC Blood Culture adequate volume Performed at [facility] [address], [place], [place] [address]    Culture   Final    NO GROWTH 5 DAYS Performed at [facility], [place] [address], [place], [place] [address]    Report Status 03/29/2018 FINAL  Final  Culture, blood (Routine x 2)     Status: None   Collection Time: 03/24/18 10:28 AM  Result Value Ref Range Status   Specimen Description   Final    BLOOD RIGHT HAND Performed at [facility] [address], [place], [place] [address]    Special Requests   Final    BOTTLES DRAWN AEROBIC AND ANAEROBIC Blood Culture results may not be optimal due to an excessive volume of blood received in culture bottles Performed at [facility] [address], [place], [place] [address]    Culture   Final    NO GROWTH 5 DAYS Performed at [facility], [place] [address], [place], [place] [address]    Report Status 03/29/2018 FINAL  Final  Urine Culture     Status: None   Collection Time: 03/26/18  2:51 PM  Result Value Ref Range Status   Specimen Description URINE, RANDOM  Final   Special Requests NONE  Final   Culture   Final    NO GROWTH Performed at [facility], [place] [address] [place], [place] [address]    Report Status 03/27/2018 FINAL  Final  Surgical PCR screen     Status: Abnormal   Collection Time: 03/27/18  8:44 PM  Result Value Ref Range Status   MRSA, PCR POSITIVE (A) NEGATIVE Final    Comment: RESULT CALLED TO, READ BACK BY AND VERIFIED  WITH: I.ESSIEN AKPAN,RN AT 2347 03/27/18,BY L.PITT    Staphylococcus aureus POSITIVE (A) NEGATIVE Final    Comment: (NOTE) The Xpert SA Assay (FDA approved for NASAL specimens in patients 76 years of age and older), is one component of a comprehensive surveillance program. It is not intended to diagnose infection nor to guide or monitor treatment.          Radiology Studies: No results found.    Scheduled Meds: .  stroke: mapping our early stages of recovery book   Does not apply Once  . aspirin EC  325 mg Oral Daily  . diltiazem  300 mg Oral Daily  . divalproex  250 mg Oral q morning - 10a  . divalproex  500 mg Oral QHS  . docusate sodium  100 mg Oral BID  . doxazosin  4 mg Oral QPM  . ezetimibe  5 mg Oral QHS  . heparin injection (subcutaneous)  5,000 Units Subcutaneous Q8H  . levothyroxine  150 mcg Oral QAC breakfast  . metoprolol tartrate  25 mg Oral BID PC  . mupirocin ointment  1 application Nasal BID  . pantoprazole  40 mg Oral Daily  . polyethylene glycol  17 g Oral BID  . pravastatin  40 mg Oral QPM  . predniSONE  10 mg Oral Q breakfast  . senna-docusate  1 tablet Oral BID   Continuous Infusions: . methocarbamol (ROBAXIN) IV       LOS: 7 days      Debbe Odea, MD [facility] Pager: [email or web address] Password [email or web address] 04/01/2018, 2:09 PM

## 2018-04-02 ENCOUNTER — Encounter: Payer: Self-pay | Admitting: Neurology

## 2018-04-02 ENCOUNTER — Other Ambulatory Visit: Payer: Self-pay

## 2018-04-02 MED ORDER — MAGNESIUM CITRATE PO SOLN: 1.0000 | Freq: Once | ORAL | Status: DC | PRN

## 2018-04-02 MED ORDER — DOCUSATE SODIUM 100 MG PO CAPS: 100.0000 mg | ORAL_CAPSULE | Freq: Two times a day (BID) | ORAL | 0 refills | Status: DC

## 2018-04-02 MED ORDER — FUROSEMIDE 40 MG PO TABS: 20.0000 mg | ORAL_TABLET | ORAL | 1 refills | Status: DC

## 2018-04-02 MED ORDER — OXYCODONE HCL 5 MG PO TABS: ORAL_TABLET | ORAL | 0 refills | Status: DC

## 2018-04-02 MED ORDER — PREDNISONE 5 MG PO TABS: 10.0000 mg | ORAL_TABLET | Freq: Every day | ORAL | Status: DC

## 2018-04-02 MED ORDER — POLYETHYLENE GLYCOL 3350 17 G PO PACK: 17.0000 g | PACK | Freq: Every day | ORAL | 0 refills | Status: DC | PRN

## 2018-04-02 MED ORDER — ENSURE ENLIVE PO LIQD: 237.0000 mL | ORAL | 12 refills | Status: DC

## 2018-04-02 MED ORDER — ACETAMINOPHEN 325 MG PO TABS: 650.0000 mg | ORAL_TABLET | Freq: Four times a day (QID) | ORAL | Status: DC | PRN

## 2018-04-02 MED ORDER — BISACODYL 10 MG RE SUPP: 10.0000 mg | Freq: Every day | RECTAL | 0 refills | Status: DC | PRN

## 2018-04-02 MED ORDER — ASPIRIN 325 MG PO TBEC: 325.0000 mg | DELAYED_RELEASE_TABLET | Freq: Every day | ORAL | 0 refills | Status: DC

## 2018-04-02 MED ORDER — METHOCARBAMOL 500 MG PO TABS: 500.0000 mg | ORAL_TABLET | Freq: Four times a day (QID) | ORAL | Status: DC | PRN

## 2018-04-02 NOTE — Plan of Care (Signed)
  Problem: Education: Goal: Knowledge of General Education information will improve Description Including pain rating scale, medication(s)/side effects and non-pharmacologic comfort measures Outcome: Progressing   Problem: Health Behavior/Discharge Planning: Goal: Ability to manage health-related needs will improve Outcome: Progressing   Problem: Clinical Measurements: Goal: Ability to maintain clinical measurements within normal limits will improve Outcome: Progressing Goal: Will remain free from infection Outcome: Progressing Goal: Diagnostic test results will improve Outcome: Progressing Goal: Respiratory complications will improve Outcome: Progressing Goal: Cardiovascular complication will be avoided Outcome: Progressing   Problem: Activity: Goal: Risk for activity intolerance will decrease Outcome: Progressing   Problem: Nutrition: Goal: Adequate nutrition will be maintained Outcome: Progressing   Problem: Coping: Goal: Level of anxiety will decrease Outcome: Progressing   Problem: Elimination: Goal: Will not experience complications related to bowel motility Outcome: Progressing Goal: Will not experience complications related to urinary retention Outcome: Progressing   Problem: Pain Managment: Goal: General experience of comfort will improve Outcome: Progressing   Problem: Safety: Goal: Ability to remain free from injury will improve Outcome: Progressing   Problem: Skin Integrity: Goal: Risk for impaired skin integrity will decrease Outcome: Progressing   Problem: Education: Goal: Knowledge of secondary prevention will improve Outcome: Progressing Goal: Knowledge of patient specific risk factors addressed and post discharge goals established will improve Outcome: Progressing   Problem: Acute Treatment Outcomes Goal: Ability to function at adequate level Outcome: Progressing

## 2018-04-02 NOTE — Clinical Social Work Note (Addendum)
Clinical Social Work Assessment  Patient Details  Name: [name] MRN: [id number] Date of Birth: 11-04-1933  Date of referral:  04/01/18               Reason for consult:  Facility Placement                Permission sought to share information with:  Facility Sport and exercise psychologist, Family Supports Permission granted to share information::  Yes, Verbal Permission Granted  Name::     Actuary::  SNF  Relationship::  Wife  Contact Information:     Housing/Transportation Living arrangements for the past 2 months:  [place] of Information:  Patient, Medical Team, Spouse Patient Interpreter Needed:  None Criminal Activity/Legal Involvement Pertinent to Current Situation/Hospitalization:  No - Comment as needed Significant Relationships:  Adult Children, Friend, Spouse, Church Lives with:  Self, Spouse Do you feel safe going back to the place where you live?  Yes Need for family participation in patient care:  No (Coment)  Care giving concerns:  Patient from home with spouse, but will now need short term rehab before returning home.   Social Worker assessment / plan:  CSW met with patient to discuss SNF placement options. Patient was on phone with MD during discussion, asked for private time to talk to the doctor and for CSW to call and speak with his wife. CSW attempted to call patient's wife, left a voicemail. Wife called back and discussed how the patient had informed MD that he was feeling down and was thinking of hurting himself, so they were placing a psych consult. CSW processed with patient's wife and provided updates on SNF placement options with her, as well. Patient's wife requesting [facility] when patient is safe to look at discharge plans. CSW spoke with patient after talking to his wife and explained that she would be back later this afternoon and we would talk more about rehab tomorrow.   Employment status:  Retired Forensic scientist:   Medicare PT Recommendations:  Inpatient [place] / Referral to community resources:  [place]  Patient/Family's Response to care:  Patient and wife agreeable to SNF placement.  Patient/Family's Understanding of and Emotional Response to Diagnosis, Current Treatment, and Prognosis:  Patient did not want to discuss his emotional state with CSW, but reported to MD that he was feeling down and hopeless and thinking of harming himself when he goes home. Patient's wife discussed how she is feeling overwhelmed and doesn't know how to help the patient when he is feeling this way. Patient's wife remains hopeful that the patient will improve and adjust and be able to go to rehab and get better.  Emotional Assessment Appearance:  Appears stated age Attitude/Demeanor/Rapport:  Engaged Affect (typically observed):  Pleasant Orientation:  Oriented to Self, Oriented to Place, Oriented to  Time, Oriented to Situation Alcohol / Substance use:  Not Applicable Psych involvement (Current and /or in the community):  No (Comment)  Discharge Needs  Concerns to be addressed:  Care Coordination Readmission within the last 30 days:  No Current discharge risk:  Physical Impairment, Dependent with Mobility Barriers to Discharge:  Continued Medical Work up   [employer], [place] 04/02/2018, 3:21 PM

## 2018-04-02 NOTE — Clinical Social Work Placement (Signed)
Nurse to call report to [phone], Room 1003B     CLINICAL SOCIAL WORK PLACEMENT  NOTE  Date:  04/02/2018  Patient Details  Name: [name] MRN: [id number] Date of Birth: 05-24-1933  Clinical Social Work is seeking post-discharge placement for this patient at the [place] level of care (*CSW will initial, date and re-position this form in  chart as items are completed):  Yes   Patient/family provided with [facility] Work Department's list of facilities offering this level of care within the geographic area requested by the patient (or if unable, by the patient's family).  Yes   Patient/family informed of their freedom to choose among providers that offer the needed level of care, that participate in Medicare, Medicaid or managed care program needed by the patient, have an available bed and are willing to accept the patient.  Yes   Patient/family informed of [facility]'s ownership interest in [facility] and [facility], as well as of the fact that they are under no obligation to receive care at these facilities.  PASRR submitted to EDS on       PASRR number received on       Existing PASRR number confirmed on 04/01/18     FL2 transmitted to all facilities in geographic area requested by pt/family on 04/01/18     FL2 transmitted to all facilities within larger geographic area on       Patient informed that his/her managed care company has contracts with or will negotiate with certain facilities, including the following:        Yes   Patient/family informed of bed offers received.  Patient chooses bed at [facility]     Physician recommends and patient chooses bed at      Patient to be transferred to [facility] on 04/02/18.  Patient to be transferred to facility by [facility]     Patient family notified on 04/02/18 of transfer.  Name of family member notified:  Self, wife     PHYSICIAN       Additional Comment:     _______________________________________________ Geralynn Ochs, LCSW 04/02/2018, 3:04 PM

## 2018-04-02 NOTE — Progress Notes (Signed)
No charge note-   Thank you for this consult to Palliative Medicine-  Called patient's spouse- [name] to schedule [place] meeting. She declined meeting and stated that she had just spoken with patient's attending MD and she was going to cancel the consult.   Please contact PMT if we can provide further assistance.   Mariana Kaufman, AGNP-C Palliative Medicine  Please call Palliative Medicine team phone with any questions [phone]. For individual providers please see [facility].

## 2018-04-02 NOTE — Progress Notes (Signed)
Pt had discharged order from day shift, report called earlier to nurse at [place] came up at 2100 to pick up pt, pt's belongings taken by pt's wife who was at bedside, night medications given and pt left at 2130, pt and wife reassured. [place], [place] Facchini Efe

## 2018-04-02 NOTE — Discharge Summary (Signed)
Physician Discharge Summary  [name] QVZ:[id number] DOB: 01/05/1934 DOA: 03/24/2018  PCP: [name], MD  Admit date: 03/24/2018 Discharge date: 04/02/2018  Admitted From: home  Disposition:  SNF   Recommendations for Outpatient Follow-up:  1. F/u with Dr [name] later this week 2. Please follow daily weights and obtain a Bmet by Monday to determine if the Lasix  dose is appropriate  Discharge Condition:  stable   CODE STATUS:  Full code   Diet recommendation:  Heart healthy Code Status: Full code Consultants:   Neuro  Ortho  Cardiology    Discharge Diagnoses:  Principal Problem:   Altered mental status Active Problems:   AKI (acute kidney injury) ([place])  Subacute osteomyelitis, right ankle and foot   S/P BKA (below knee amputation) unilateral, right (HCC)   Unilateral complete BKA, right, initial encounter ([place])   Atrial fibrillation with rapid ventricular response (HCC)   Post-operative pain   Essential hypertension   Seizure disorder (HCC)   GERD (gastroesophageal reflux disease)   Memory difficulty   CKD (chronic kidney disease) stage 3, GFR 30-59 ml/min (HCC)   Coronary artery disease involving native coronary artery of native heart without angina pectoris    Brief Summary: [name] is an 85 year old male with history of stroke, h/o absence seizures, A. fib off anticoagulation for past 6-7 months due to lower extremity hematoma secondary to fall, seizure disorder, chronic kidney disease stage III, chronic diastolic CHF, OSA, CAD , chronic right leg wound (managed at wound care center, Dr Dellia Nims) who was hospitalized from 1/4-1/8 with sepsis secondary to cellulitis of the right leg.  He was brought in by his wife on 1/13 for "sluggishness for a few days" and a staring spell. Neuro was concerned about a seizure. Depakote had been decreased recently apparently as seizures had been controlled.  In ED> Depakote level 37- given a loading dose of Depakote  IV. An MRI subsequently revealed a Right cerebellar punctate acute infarct. Stroke team consulted.  1/17- ortho consult requested for right LE chronic ulcer with osteomyelitis of distal fibula. Dr [name] recommended a right transtibial amputation and he underwent surgery the same day.  Hospital Course:  Principal Problem:   Altered mental status on admission - ? Post ictal state after an absence seizure - seizure disorder - has been evaluated by neuro and the suspicion was that he had a breakthrough seizure due to underlying uncontrolled medical issues- it is recommended to continue Depakote 250 in AM and 500 in PM    Active Problems: Acute CVA- incidental finding and no physical finding to coincide with the site of the infarct - right cerebellar infarct noted on MRI- ? Embolic due to A-fib vs iscemic - 2 D ECHO did not show a thrombus -  CTA head/neck> no significant stenosis - cardiology consulted in regards to whether Eliquis needs to be resumed at this point- they not feel his Eliquis should be resumed due to the prior spontaneous hematoma - cont Pravastatin and ASA 325 mg daily - he should follow up with his primary cardiologist as outpt  Cellulitis and osteomyelitis of RLE - s/p Transtibial Amputation by Dr [name] on 03/28/18 - has wound vac- needs f/u with Dr [name] 1 wk after above surgery (which would be later this week)  Chronic d CHF - Furosemide is currently on hold as Cr was rising when it was being given- will reduce the dose from 40 to 20 mg daily - will need to follow daily  weights and obtain a Bmet by Monday to determine if the dose is appropriate   Suicidal ideation - the patient is clearly depressed after his amputation and yesterday had expressed thoughts of killing himself- I did order a suicide sitter and a psych eval yesterday. I have spoken with him in the presence of his wife again today. Today he is in a different mood and feels that he is no longer having those  thoughts. He is willing to pursue rehab at a SNF. Have discussed the option of palliative care and he states he has no intention of transitioning to comfort or hospice care at the moment.   Chronic A-fib- short lived episode of NSVT on 1/17, small sinus pause on 1/19 (2.35 sec) - cont ASA as mentioned above - cont Metoprolol and Cardizem - d/c telemetry as recommended by cardiology  Chronic anemia -stable     Hypothyroid - Synthroid  Chronic severe arthritis -cont  Prednisone 10 mg, Oxycodone and Lidocaine patch     CKD (chronic kidney disease) stage 3, GFR 30-59 ml/min (HCC)  - stable - follow   Left testicle pain - no acute issues noted on exam- not tender or swollen- they feel his scrotum is being crushed while sitting in a v shape in the bed- advised to get OOB daily with RN or PT assistance   Procedures:    Bilateral LE Venous Duplex  -Right: There is no evidence of deep vein thrombosis in the lower extremity. No cystic structure found in the popliteal fossa.  Left: There is no evidence of deep vein thrombosis in the lower extremity. No cystic structure found in the popliteal fossa.   EEG:  EEG Abnormalities:None   Transtibial amputation- right    Discharge Exam: Vitals:   04/02/18 0325 04/02/18 0800  BP: (!) 147/87 120/80  Pulse: 87 77  Resp: 17 18  Temp: 97.7 F (36.5 C) 98.5 F (36.9 C)  SpO2: 97% 98%   Vitals:   04/01/18 2017 04/01/18 2348 04/02/18 0325 04/02/18 0800  BP: 135/65 (!) 152/76 (!) 147/87 120/80  Pulse: 65 76 87 77  Resp: 16 17 17 18   Temp: 98 F (36.7 C) 98.2 F (36.8 C) 97.7 F (36.5 C) 98.5 F (36.9 C)  TempSrc: Oral Oral Oral Oral  SpO2: 100% 96% 97% 98%  Weight:      Height:        General: Pt is alert, awake, not in acute distress Cardiovascular: RRR, S1/S2 +, no rubs, no gallops Respiratory: CTA bilaterally, no wheezing, no rhonchi Abdominal: Soft, NT, ND, bowel sounds + Extremities: no edema, no  cyanosis   Discharge Instructions  Discharge Instructions    Ambulatory referral to Neurology   Complete by:  As directed    An appointment is requested in approximately: 4 weeks. New pt, family prefers Dr. Delice Lesch   Diet - low sodium heart healthy   Complete by:  As directed    Increase activity slowly   Complete by:  As directed    Negative Pressure Wound Therapy - Incisional   Complete by:  As directed      Allergies as of 04/02/2018      Reactions   Eliquis [apixaban] Other (See Comments)   bleeding   Demerol [meperidine] Nausea And Vomiting   Keppra [levetiracetam] Other (See Comments)   Causes sleepiness, mental status changes      Medication List    STOP taking these medications   cephALEXin 500 MG capsule Commonly known  as:  KEFLEX   doxycycline 100 MG tablet Commonly known as:  VIBRA-TABS   HYDROcodone-acetaminophen 10-325 MG tablet Commonly known as:  NORCO     TAKE these medications   acetaminophen 325 MG tablet Commonly known as:  TYLENOL Take 2 tablets (650 mg total) by mouth every 6 (six) hours as needed for mild pain (pain score 1-3 or temp > 100.5). What changed:    when to take this  reasons to take this  Another medication with the same name was removed. Continue taking this medication, and follow the directions you see here.   alendronate 70 MG tablet Commonly known as:  FOSAMAX Take 70 mg by mouth once a week. On Wednesday. Remain upright for 30-60 minutes.   aspirin 325 MG EC tablet Take 1 tablet (325 mg total) by mouth daily. Start taking on:  April 03, 2018 What changed:    medication strength  how much to take   bisacodyl 10 MG suppository Commonly known as:  DULCOLAX Place 1 suppository (10 mg total) rectally daily as needed for moderate constipation.   calcium carbonate 600 MG tablet Commonly known as:  OS-CAL Take 600 mg by mouth daily after breakfast.   CERTAVITE/ANTIOXIDANTS Tabs Take 1 tablet by mouth every  evening.   diclofenac sodium 1 % Gel Commonly known as:  VOLTAREN Apply 2 g topically daily as needed (pain).   diltiazem 300 MG 24 hr capsule Commonly known as:  CARDIZEM CD TAKE 1 CAPSULE(300 MG) BY MOUTH EVERY MORNING. DO NOT CRUSH What changed:  See the new instructions.   divalproex 250 MG DR tablet Commonly known as:  DEPAKOTE Take 250 mg by mouth every morning.   divalproex 250 MG DR tablet Commonly known as:  DEPAKOTE Take 500 mg by mouth every evening.   docusate sodium 100 MG capsule Commonly known as:  COLACE Take 1 capsule (100 mg total) by mouth 2 (two) times daily.   doxazosin 4 MG tablet Commonly known as:  CARDURA Take 4 mg by mouth every evening.   feeding supplement (ENSURE ENLIVE) Liqd Take 237 mLs by mouth daily.   feeding supplement (PRO-STAT SUGAR FREE 64) Liqd Take 30 mLs by mouth 2 (two) times daily.   ferrous sulfate 325 (65 FE) MG tablet Take 1 tablet (325 mg total) by mouth 2 (two) times daily with a meal.   fluticasone 50 MCG/ACT nasal spray Commonly known as:  FLONASE Place 2 sprays into both nostrils daily as needed for allergies or rhinitis.   furosemide 40 MG tablet Commonly known as:  LASIX Take 0.5 tablets (20 mg total) by mouth every morning. Or per cardiologist instructions since patient cannot be weighed What changed:  how much to take   lactobacillus acidophilus & bulgar chewable tablet Chew 1 tablet by mouth 3 (three) times daily with meals.   levothyroxine 150 MCG tablet Commonly known as:  SYNTHROID, LEVOTHROID Take 150 mcg by mouth daily before breakfast.   lidocaine 5 % Commonly known as:  LIDODERM Place 1 patch onto the skin daily as needed. Remove & Discard patch within 12 hours or as directed by MD What changed:  reasons to take this   magnesium citrate Soln Take 296 mLs (1 Bottle total) by mouth once as needed for severe constipation.   methocarbamol 500 MG tablet Commonly known as:  ROBAXIN Take 1 tablet  (500 mg total) by mouth every 6 (six) hours as needed for muscle spasms.   metoprolol tartrate 25 MG tablet Commonly  known as:  LOPRESSOR Take 1 tablet (25 mg total) by mouth 2 (two) times daily. Take with or immediately following a meal.   MUSCLE RUB EX Apply 1 application topically daily as needed (shoulder pain).   nitroGLYCERIN 0.4 MG SL tablet Commonly known as:  NITROSTAT Place 0.4 mg under the tongue every 5 (five) minutes as needed for chest pain. Max of 3 tablets   omeprazole 20 MG capsule Commonly known as:  PRILOSEC Take 20 mg by mouth daily before breakfast.   oxyCODONE 5 MG immediate release tablet Commonly known as:  Oxy IR/ROXICODONE Take 5 mg for moderate pain (level 4-7) and take 10 mg for severe pain (level 8-10)   polyethylene glycol packet Commonly known as:  MIRALAX / GLYCOLAX Take 17 g by mouth daily as needed for mild constipation.   potassium chloride SA 20 MEQ tablet Commonly known as:  K-DUR,KLOR-CON TAKE 1 TABLET BY MOUTH DAILY ALONG WITH LASIX 40MG  OR AS DIRECTED BY CARDIOLOGIST   pravastatin 40 MG tablet Commonly known as:  PRAVACHOL Take 40 mg by mouth every evening.   predniSONE 5 MG tablet Commonly known as:  DELTASONE Take 2 tablets (10 mg total) by mouth daily with breakfast.   sodium chloride 0.65 % Soln nasal spray Commonly known as:  OCEAN Place 1 spray into both nostrils as needed for congestion.   vitamin B-12 500 MCG tablet Commonly known as:  CYANOCOBALAMIN Take 500 mcg by mouth at bedtime.   VITAMIN D (ERGOCALCIFEROL) PO Take 1 capsule by mouth daily.   ZETIA 10 MG tablet Generic drug:  ezetimibe Take 5 mg by mouth at bedtime. (0.5 tablet)      Follow-up Information    Cameron Sprang, MD Follow up in 4 week(s).   Specialty:  Neurology Why:  f/u with Dr. Delice Lesch for seizures and stroke. 4 weeks after discharge.  Contact information: [place] [address] [phone]        Newt Minion, MD. Schedule an appointment as soon as possible for a visit in 1 week(s).   Specialty:  Orthopedic Surgery Why:  later this week Contact information: [address] [place] [address] [phone]          Allergies  Allergen Reactions  . Eliquis [Apixaban] Other (See Comments)    bleeding  . Demerol [Meperidine] Nausea And Vomiting  . Keppra [Levetiracetam] Other (See Comments)    Causes sleepiness, mental status changes     Procedures/Studies:    Ct Angio Head W Or Wo Contrast  Result Date: 03/24/2018 CLINICAL DATA:  History of atrial fibrillation with altered mental status. EXAM: CT ANGIOGRAPHY HEAD AND NECK TECHNIQUE: Multidetector CT imaging of the head and neck was performed using the standard protocol during bolus administration of intravenous contrast. Multiplanar CT image reconstructions and MIPs were obtained to evaluate the vascular anatomy. Carotid stenosis measurements (when applicable) are obtained utilizing NASCET criteria, using the distal internal carotid diameter as the denominator. CONTRAST:  175mL ISOVUE-370 IOPAMIDOL (ISOVUE-370) INJECTION 76% COMPARISON:  Head CT 07/01/2017 FINDINGS: CTA NECK FINDINGS SKELETON: Long segment cervical spinal fusion, anteriorly from C3 to C6 and posteriorly from C2-T1. OTHER NECK: Normal pharynx, larynx and major salivary glands. No cervical lymphadenopathy. Unremarkable thyroid gland. UPPER CHEST: No pneumothorax or pleural effusion. No nodules or masses. AORTIC ARCH: There is no calcific atherosclerosis of the aortic arch. There is no aneurysm, dissection or hemodynamically significant stenosis of the visualized ascending aorta and aortic  arch. Conventional 3 vessel aortic branching pattern. The visualized proximal subclavian arteries are widely patent. RIGHT CAROTID SYSTEM: --Common carotid artery: Widely patent origin without common carotid artery dissection or aneurysm. --Internal carotid artery: No  dissection, occlusion or aneurysm. Mild atherosclerotic calcification at the carotid bifurcation without hemodynamically significant stenosis. --External carotid artery: No acute abnormality. LEFT CAROTID SYSTEM: --Common carotid artery: Widely patent origin without common carotid artery dissection or aneurysm. --Internal carotid artery: No dissection, occlusion or aneurysm. Mild atherosclerotic calcification at the carotid bifurcation without hemodynamically significant stenosis. --External carotid artery: No acute abnormality. VERTEBRAL ARTERIES: Codominant configuration. There is atherosclerotic calcification of both vertebral artery origins. There is atherosclerotic calcification of both V4 segments. The V2 and V3 segments are normal. CTA HEAD FINDINGS POSTERIOR CIRCULATION: --Basilar artery: Normal. --Posterior cerebral arteries: Both are predominantly supplied by the posterior communicating arteries. --Superior cerebellar arteries: Normal. --Inferior cerebellar arteries: Normal anterior and posterior inferior cerebellar arteries. ANTERIOR CIRCULATION: --Intracranial internal carotid arteries: Atherosclerotic calcification of the internal carotid arteries at the skull base without hemodynamically significant stenosis. --Anterior cerebral arteries: Normal. Both A1 segments are present. Patent anterior communicating artery. --Middle cerebral arteries: Normal. --Posterior communicating arteries: Present bilaterally. VENOUS SINUSES: As permitted by contrast timing, patent. ANATOMIC VARIANTS: None DELAYED PHASE: No parenchymal contrast enhancement. Review of the MIP images confirms the above findings. IMPRESSION: No emergent large vessel occlusion or hemodynamically significant stenosis of the arteries of the head and neck. Electronically Signed   By: Ulyses Jarred M.D.   On: 03/24/2018 18:39   Dg Chest 2 View  Result Date: 03/24/2018 CLINICAL DATA:  Sepsis, weakness. EXAM: CHEST - 2 VIEW COMPARISON:  Radiograph  March 13, 2018. FINDINGS: Stable cardiomegaly. No pneumothorax is noted. Right lung is clear. Stable elevated left hemidiaphragm is noted with minimal left basilar subsegmental atelectasis. Small left pleural effusion is noted. Bony thorax is unremarkable. IMPRESSION: Stable elevated left hemidiaphragm with minimal left basilar atelectasis. Small left pleural effusion is noted. Electronically Signed   By: Marijo Conception, M.D.   On: 03/24/2018 10:44   Dg Chest 2 View  Result Date: 03/13/2018 CLINICAL DATA:  Sepsis. EXAM: CHEST - 2 VIEW COMPARISON:  Radiograph Jul 15, 2017. FINDINGS: Stable cardiomediastinal silhouette. Stable elevated left hemidiaphragm is noted with minimal left basilar subsegmental atelectasis. No pneumothorax or significant pleural effusion is noted. Right lung is clear. Bony thorax is unremarkable. IMPRESSION: Stable elevated left hemidiaphragm with minimal left basilar subsegmental atelectasis. Electronically Signed   By: Marijo Conception, M.D.   On: 03/13/2018 11:29   Dg Ankle Complete Right  Result Date: 03/13/2018 CLINICAL DATA:  Right ankle wound. EXAM: RIGHT ANKLE - COMPLETE 3+ VIEW COMPARISON:  Radiographs of May 23, 2017. FINDINGS: There is no evidence of fracture, dislocation, or joint effusion. There is no evidence of arthropathy or other focal bone abnormality. Vascular calcifications are noted. Probable soft tissue ulceration is seen overlying distal right fibula. No lytic destruction is seen to suggest osteomyelitis. IMPRESSION: Probable soft tissue ulceration seen overlying distal right fibula. No significant bony abnormality is noted. Electronically Signed   By: Marijo Conception, M.D.   On: 03/13/2018 11:31   Ct Head Wo Contrast  Result Date: 03/24/2018 CLINICAL DATA:  Altered level of consciousness EXAM: CT HEAD WITHOUT CONTRAST TECHNIQUE: Contiguous axial images were obtained from the base of the skull through the vertex without intravenous contrast. COMPARISON:   04/17/2017 FINDINGS: Brain: No evidence of acute infarction, hemorrhage, extra-axial collection, ventriculomegaly, or mass effect. Generalized cerebral  atrophy. Periventricular white matter low attenuation likely secondary to microangiopathy. Vascular: Cerebrovascular atherosclerotic calcifications are noted. Skull: Negative for fracture or focal lesion. Retro-dental pannus formation. Sinuses/Orbits: Visualized portions of the orbits are unremarkable. Visualized portions of the paranasal sinuses and mastoid air cells are unremarkable. Other: None. IMPRESSION: 1. No acute intracranial pathology. 2. Chronic microvascular disease and cerebral atrophy. Electronically Signed   By: Kathreen Devoid   On: 03/24/2018 13:19   Ct Angio Neck W Or Wo Contrast  Result Date: 03/24/2018 CLINICAL DATA:  History of atrial fibrillation with altered mental status. EXAM: CT ANGIOGRAPHY HEAD AND NECK TECHNIQUE: Multidetector CT imaging of the head and neck was performed using the standard protocol during bolus administration of intravenous contrast. Multiplanar CT image reconstructions and MIPs were obtained to evaluate the vascular anatomy. Carotid stenosis measurements (when applicable) are obtained utilizing NASCET criteria, using the distal internal carotid diameter as the denominator. CONTRAST:  134mL ISOVUE-370 IOPAMIDOL (ISOVUE-370) INJECTION 76% COMPARISON:  Head CT 07/01/2017 FINDINGS: CTA NECK FINDINGS SKELETON: Long segment cervical spinal fusion, anteriorly from C3 to C6 and posteriorly from C2-T1. OTHER NECK: Normal pharynx, larynx and major salivary glands. No cervical lymphadenopathy. Unremarkable thyroid gland. UPPER CHEST: No pneumothorax or pleural effusion. No nodules or masses. AORTIC ARCH: There is no calcific atherosclerosis of the aortic arch. There is no aneurysm, dissection or hemodynamically significant stenosis of the visualized ascending aorta and aortic arch. Conventional 3 vessel aortic branching pattern.  The visualized proximal subclavian arteries are widely patent. RIGHT CAROTID SYSTEM: --Common carotid artery: Widely patent origin without common carotid artery dissection or aneurysm. --Internal carotid artery: No dissection, occlusion or aneurysm. Mild atherosclerotic calcification at the carotid bifurcation without hemodynamically significant stenosis. --External carotid artery: No acute abnormality. LEFT CAROTID SYSTEM: --Common carotid artery: Widely patent origin without common carotid artery dissection or aneurysm. --Internal carotid artery: No dissection, occlusion or aneurysm. Mild atherosclerotic calcification at the carotid bifurcation without hemodynamically significant stenosis. --External carotid artery: No acute abnormality. VERTEBRAL ARTERIES: Codominant configuration. There is atherosclerotic calcification of both vertebral artery origins. There is atherosclerotic calcification of both V4 segments. The V2 and V3 segments are normal. CTA HEAD FINDINGS POSTERIOR CIRCULATION: --Basilar artery: Normal. --Posterior cerebral arteries: Both are predominantly supplied by the posterior communicating arteries. --Superior cerebellar arteries: Normal. --Inferior cerebellar arteries: Normal anterior and posterior inferior cerebellar arteries. ANTERIOR CIRCULATION: --Intracranial internal carotid arteries: Atherosclerotic calcification of the internal carotid arteries at the skull base without hemodynamically significant stenosis. --Anterior cerebral arteries: Normal. Both A1 segments are present. Patent anterior communicating artery. --Middle cerebral arteries: Normal. --Posterior communicating arteries: Present bilaterally. VENOUS SINUSES: As permitted by contrast timing, patent. ANATOMIC VARIANTS: None DELAYED PHASE: No parenchymal contrast enhancement. Review of the MIP images confirms the above findings. IMPRESSION: No emergent large vessel occlusion or hemodynamically significant stenosis of the arteries of  the head and neck. Electronically Signed   By: Ulyses Jarred M.D.   On: 03/24/2018 18:39   Mr Brain Wo Contrast  Result Date: 03/25/2018 CLINICAL DATA:  Atrial fibrillation. Cognitive impairment. History of seizures. Acute presentation with altered mental status. EXAM: MRI HEAD WITHOUT CONTRAST TECHNIQUE: Multiplanar, multiecho pulse sequences of the brain and surrounding structures were obtained without intravenous contrast. COMPARISON:  CT studies 03/24/2018.  MRI 02/01/2015. FINDINGS: Brain: Punctate acute infarction in the right cerebellum. No other acute infarction or cause of restricted diffusion. The brainstem is normal. No other cerebellar finding. Cerebral hemispheres show age related atrophy with moderate chronic small-vessel ischemic changes of the deep  white matter. No large vessel territory infarction. No mass lesion, hemorrhage, hydrocephalus or extra-axial collection. No mesial temporal lesion is seen. Vascular: Major vessels at the base of the brain show flow. Skull and upper cervical spine: Negative Sinuses/Orbits: Clear/normal Other: None IMPRESSION: Punctate acute infarction in the right cerebellum. Its not absolutely clear how this would relate to the clinical presentation. No second acute lesion. Generalized atrophy. Moderate chronic small-vessel ischemic changes of the cerebral hemispheric white matter. Electronically Signed   By: Nelson Chimes M.D.   On: 03/25/2018 08:24   Mr Tibia Fibula Right Wo Contrast  Result Date: 03/26/2018 CLINICAL DATA:  Chronic wound on the right lower leg. Cellulitis. Question abscess or osteomyelitis. EXAM: MRI OF LOWER RIGHT EXTREMITY WITHOUT CONTRAST TECHNIQUE: Multiplanar, multisequence MR imaging of the right lower leg was performed. No intravenous contrast was administered. COMPARISON:  Plain films right ankle 03/13/2018. FINDINGS: Bones/Joint/Cartilage Mild marrow edema is seen in the distal 2.4 cm of the fibula worrisome for osteomyelitis. Bone marrow  signal is otherwise unremarkable. No fracture or stress change. Artifact from knee replacement on the right is noted. Ligaments Negative. Muscles and Tendons Intact. There is fatty atrophy of lower leg musculature. No intramuscular fluid collection. Soft tissues Skin wound is seen just anterior to the distal fibula. No underlying abscess. IMPRESSION: Marrow edema in the distal 2.4 cm of the fibula worrisome for osteomyelitis. Negative for abscess. Atrophy of lower leg musculature is presumably related to disuse. Electronically Signed   By: Inge Rise M.D.   On: 03/26/2018 12:36   Vas [place] Lower Extremity Venous (dvt)  Result Date: 03/26/2018  Lower Venous Study Indications: Ulcer.  Limitations: Patient movement. Performing Technologist: Abram Sander RVS  Examination Guidelines: A complete evaluation includes B-mode imaging, spectral Doppler, color Doppler, and power Doppler as needed of all accessible portions of each vessel. Bilateral testing is considered an integral part of a complete examination. Limited examinations for reoccurring indications may be performed as noted.  Right Venous Findings: +---------+---------------+---------+-----------+----------+-------+          CompressibilityPhasicitySpontaneityPropertiesSummary +---------+---------------+---------+-----------+----------+-------+ CFV      Full           Yes      Yes                          +---------+---------------+---------+-----------+----------+-------+ SFJ      Full                                                 +---------+---------------+---------+-----------+----------+-------+ FV Prox  Full                                                 +---------+---------------+---------+-----------+----------+-------+ FV Mid   Full                                                 +---------+---------------+---------+-----------+----------+-------+ FV DistalFull                                                  +---------+---------------+---------+-----------+----------+-------+  PFV      Full                                                 +---------+---------------+---------+-----------+----------+-------+ POP      Full           Yes      Yes                          +---------+---------------+---------+-----------+----------+-------+ PTV      Full                                                 +---------+---------------+---------+-----------+----------+-------+ PERO     Full                                                 +---------+---------------+---------+-----------+----------+-------+  Left Venous Findings: +---------+---------------+---------+-----------+----------+-------+          CompressibilityPhasicitySpontaneityPropertiesSummary +---------+---------------+---------+-----------+----------+-------+ CFV      Full           Yes      Yes                          +---------+---------------+---------+-----------+----------+-------+ SFJ      Full                                                 +---------+---------------+---------+-----------+----------+-------+ FV Prox  Full                                                 +---------+---------------+---------+-----------+----------+-------+ FV Mid   Full                                                 +---------+---------------+---------+-----------+----------+-------+ FV DistalFull                                                 +---------+---------------+---------+-----------+----------+-------+ PFV      Full                                                 +---------+---------------+---------+-----------+----------+-------+ POP      Full           Yes      Yes                          +---------+---------------+---------+-----------+----------+-------+  PTV      Full                                                  +---------+---------------+---------+-----------+----------+-------+ PERO     Full                                                 +---------+---------------+---------+-----------+----------+-------+    Summary: Right: There is no evidence of deep vein thrombosis in the lower extremity. No cystic structure found in the popliteal fossa. Left: There is no evidence of deep vein thrombosis in the lower extremity. No cystic structure found in the popliteal fossa.  *See table(s) above for measurements and observations. Electronically signed by Curt Jews MD on 03/26/2018 at 8:06:40 PM.    Final      The results of significant diagnostics from this hospitalization (including imaging, microbiology, ancillary and laboratory) are listed below for reference.     Microbiology: Recent Results (from the past 240 hour(s))  Culture, blood (Routine x 2)     Status: None   Collection Time: 03/24/18 10:27 AM  Result Value Ref Range Status   Specimen Description   Final    BLOOD LEFT ANTECUBITAL Performed at [facility] [address], [place], [place] [address]    Special Requests   Final    BOTTLES DRAWN AEROBIC AND ANAEROBIC Blood Culture adequate volume Performed at [facility] [address], [place], [place] [address]    Culture   Final    NO GROWTH 5 DAYS Performed at [facility], [place] [address] [place], [place] [address]    Report Status 03/29/2018 FINAL  Final  Culture, blood (Routine x 2)     Status: None   Collection Time: 03/24/18 10:28 AM  Result Value Ref Range Status   Specimen Description   Final    BLOOD RIGHT HAND Performed at [facility] [address], [place], [place] [address]    Special Requests   Final    BOTTLES DRAWN AEROBIC AND ANAEROBIC Blood Culture results may not be optimal due to an excessive volume of blood received in culture bottles Performed at [facility] [address] [place], [place] [address]    Culture   Final    NO GROWTH 5 DAYS Performed at [facility], [place] [address], [place], [place] [address]    Report Status 03/29/2018 FINAL  Final  Urine Culture     Status: None   Collection Time: 03/26/18  2:51 PM  Result Value Ref Range Status   Specimen Description URINE, RANDOM  Final   Special Requests NONE  Final   Culture   Final    NO GROWTH Performed at [facility], [place] [address] [place], [place] [address]    Report Status 03/27/2018 FINAL  Final  Surgical PCR screen     Status: Abnormal   Collection Time: 03/27/18  8:44 PM  Result Value Ref Range Status   MRSA, PCR POSITIVE (A) NEGATIVE Final    Comment: RESULT CALLED TO, READ BACK BY AND VERIFIED WITH: I.ESSIEN AKPAN,RN AT 2347 03/27/18,BY L.PITT    Staphylococcus aureus POSITIVE (  A) NEGATIVE Final    Comment: (NOTE) The Xpert SA Assay (FDA approved for NASAL specimens in patients 55 years of age and older), is one component of a comprehensive surveillance program. It is not intended to diagnose infection nor to guide or monitor treatment.      Labs: BNP (last 3 results) Recent Labs    07/01/17 1526 07/11/17 0346  BNP 345.9* 443.1*   Basic Metabolic Panel: Recent Labs  Lab 03/27/18 0549 03/28/18 0553 03/29/18 0746 03/29/18 1822 03/30/18 0619 03/31/18 0601  NA 142 142 140 137 140 140  K 3.7 3.7 4.2 4.6 4.3 4.3  CL 107 107 105 105 107 110  CO2 24 25 23 22 23 22   GLUCOSE 78 81 122* 160* 99 87  BUN 18 18 27* 32* 34* 34*  CREATININE 1.26* 1.44* 1.68* 1.87* 1.54* 1.43*  CALCIUM 8.2* 8.1* 7.7* 7.5* 7.4* 7.3*  MG 1.7 1.9 1.8  --  1.9 1.9  PHOS 3.1 3.8 4.3  --  3.8 2.6   Liver Function Tests: Recent Labs  Lab 03/27/18 0549 03/28/18 0553 03/29/18 0746 03/30/18 0619 03/31/18 0601  AST 28 28 23 22 21   ALT 29 29 25 21 20   ALKPHOS 52 51 52 50 44  BILITOT 0.8 0.8 0.9 0.4 0.4  PROT 5.0* 5.0* 5.3* 4.8* 4.6*  ALBUMIN 2.7* 2.7* 2.8* 2.7* 2.5*   No results  for input(s): LIPASE, AMYLASE in the last 168 hours. No results for input(s): AMMONIA in the last 168 hours. CBC: Recent Labs  Lab 03/27/18 0549 03/28/18 0553 03/29/18 0746 03/30/18 0619 03/31/18 0601  WBC 8.6 8.3 9.8 10.4 8.4  NEUTROABS 6.0 5.8 8.9* 8.7* 6.0  HGB 9.3* 9.4* 8.9* 8.8* 8.8*  HCT 28.9* 29.5* 27.9* 28.4* 28.2*  MCV 101.0* 102.8* 102.2* 104.0* 103.7*  PLT 224 209 192 197 186   Cardiac Enzymes: No results for input(s): CKTOTAL, CKMB, CKMBINDEX, TROPONINI in the last 168 hours. BNP: Invalid input(s): POCBNP CBG: No results for input(s): GLUCAP in the last 168 hours. D-Dimer No results for input(s): DDIMER in the last 72 hours. Hgb A1c No results for input(s): HGBA1C in the last 72 hours. Lipid Profile No results for input(s): CHOL, HDL, LDLCALC, TRIG, CHOLHDL, LDLDIRECT in the last 72 hours. Thyroid function studies No results for input(s): TSH, T4TOTAL, T3FREE, THYROIDAB in the last 72 hours.  Invalid input(s): FREET3 Anemia work up No results for input(s): VITAMINB12, FOLATE, FERRITIN, TIBC, IRON, RETICCTPCT in the last 72 hours. Urinalysis    Component Value Date/Time   COLORURINE COLORLESS (A) 03/26/2018 1420   APPEARANCEUR CLEAR 03/26/2018 1420   LABSPEC 1.006 03/26/2018 1420   PHURINE 6.0 03/26/2018 1420   GLUCOSEU NEGATIVE 03/26/2018 1420   HGBUR NEGATIVE 03/26/2018 1420   BILIRUBINUR NEGATIVE 03/26/2018 1420   KETONESUR NEGATIVE 03/26/2018 1420   PROTEINUR NEGATIVE 03/26/2018 1420   UROBILINOGEN 1.0 09/16/2007 1212   NITRITE NEGATIVE 03/26/2018 1420   LEUKOCYTESUR NEGATIVE 03/26/2018 1420   Sepsis Labs Invalid input(s): PROCALCITONIN,  WBC,  LACTICIDVEN Microbiology Recent Results (from the past 240 hour(s))  Culture, blood (Routine x 2)     Status: None   Collection Time: 03/24/18 10:27 AM  Result Value Ref Range Status   Specimen Description   Final    BLOOD LEFT ANTECUBITAL Performed at [facility], [place] [address], [place], [place] [address]    Special Requests   Final    BOTTLES DRAWN AEROBIC AND ANAEROBIC Blood Culture adequate volume Performed at [facility]  [place] [address] [place], [place] [address]    Culture   Final    NO GROWTH 5 DAYS Performed at [facility], [place] [address] [place], [place] [address]    Report Status 03/29/2018 FINAL  Final  Culture, blood (Routine x 2)     Status: None   Collection Time: 03/24/18 10:28 AM  Result Value Ref Range Status   Specimen Description   Final    BLOOD RIGHT HAND Performed at [facility] [address], [place], [place] [address]    Special Requests   Final    BOTTLES DRAWN AEROBIC AND ANAEROBIC Blood Culture results may not be optimal due to an excessive volume of blood received in culture bottles Performed at [facility] [address] [place], [place] [address]    Culture   Final    NO GROWTH 5 DAYS Performed at [facility], [place] [address], [place], [place] [address]    Report Status 03/29/2018 FINAL  Final  Urine Culture     Status: None   Collection Time: 03/26/18  2:51 PM  Result Value Ref Range Status   Specimen Description URINE, RANDOM  Final   Special Requests NONE  Final   Culture   Final    NO GROWTH Performed at [facility], [place] [address], [place], [place] [address]    Report Status 03/27/2018 FINAL  Final  Surgical PCR screen     Status: Abnormal   Collection Time: 03/27/18  8:44 PM  Result Value Ref Range Status   MRSA, PCR POSITIVE (A) NEGATIVE Final    Comment: RESULT CALLED TO, READ BACK BY AND VERIFIED WITH: I.ESSIEN AKPAN,RN AT 2347 03/27/18,BY L.PITT    Staphylococcus aureus POSITIVE (A) NEGATIVE Final    Comment: (NOTE) The Xpert SA Assay (FDA approved for NASAL specimens in patients 72 years of age and older), is one component of a comprehensive surveillance program. It is not intended to diagnose infection nor to guide or  monitor treatment.      Time coordinating discharge in minutes: 65  SIGNED:   Debbe Odea, MD  [facility] 04/02/2018, 11:15 AM Pager   If 7PM-7AM, please contact night-coverage [email or web address] Password [email or web address]

## 2018-04-02 NOTE — Progress Notes (Signed)
Discharge instructions (including medications) discussed with and copy provided to patient/caregiver. Called report to [facility] and voiced no questions or concerns. AVS in discharge packet to [facility].

## 2018-04-02 NOTE — Progress Notes (Signed)
Palliative Medicine RN Note: Rec'd call from Dr Wynelle Cleveland requesting consult order to PMT be cancelled; placed TORB order.  Marjie Skiff Eugene Zeiders, RN, BSN, [facility] Palliative Medicine Team 04/02/2018 11:59 AM Office [phone]

## 2018-04-02 NOTE — Patient Outreach (Signed)
[facility]) Care Management  04/02/2018  [name] 01-12-1934 [id number]   [facility] BSW closing case due to hospitalization for ten days.  Ronn Melena, BSW Social Worker [phone]

## 2018-04-02 NOTE — Discharge Instructions (Signed)
Living With an Amputation °An amputation is a surgical procedure to remove all or part of an arm or leg (limb). After this surgery, it will take time for you to heal and get used to living with the amputation. There are things you can do to help yourself adjust. °Living with an amputation can be challenging, but it is often possible to do all of the activities that you used to do. °How to manage lifestyle changes ° °  ° °· Return to your normal activities as told by your health care provider. Ask your health care provider what activities are safe for you. °· You may choose to have an artificial limb (prosthesis) made for you. Use and care for your prosthesis as told by your health care provider. °· Discuss all of your leisure interests with your health care provider and prosthesis specialist. Equipment changes can often be made, which may allow you to return to a sport or hobby. Some companies design special equipment for this purpose. °· Talk with your health care team about returning to work. °? When you are ready to return to work, your therapists can evaluate your job site and make recommendations to help you do your job. °? If you are not able to return to the same job, your local Office of Vocational Rehabilitation can help you with job retraining. °· Talk with your health care provider about returning to driving. You may: °? Work with an occupational therapist to learn new ways for safely driving with an amputation. °? Work with a prosthesis specialist or physical therapist to identify assistive devices for safe driving. °How to recognize stress °Some common challenges of living with an amputation may bring about stress. These issues are normal. They include: °· Changes in how you move around. °· Changes in how you care for yourself. °· Changes in how you participate in leisure activities. °· Emotions after the amputation, such as grief and anger. °· Issues with body image and weight. °· Problems with pain  and treatment. °How to manage stress °Use these strategies to ease stress related to the daily challenges of living with an amputation: °· Be aware of any sources of your stress. Monitor yourself for symptoms of stress. Identify what challenges cause the most stress for you. °· Rethink the situation. Try to: °? Think realistically about stressful challenges. Do not ignore these challenges. Do not overreact to them. °? Try to find the positives in a stressful situation. Do not focus on the negatives. °· Talk about your emotional challenges, such as grief, with a mental health professional. °· Connect with other people who have gone through the same experience. °· Find ways to help relax your body and mind. Examples include: °? Meditation, deep breathing, or progressive relaxation techniques. °? Hypnosis or guided imagery. °? Yoga or tai chi. °? Biofeedback or mindfulness techniques. °? Keeping a journal. °? Doing hobbies, like listening to music or being out in nature. °? Exercise. Talk with your rehabilitation team about the best way to get 30 minutes of physical activity at least 5 days a week. °Where to find support: °Talking to others °· Look for a local or online support group for people living with an amputation. °Finances °· Talk to your insurance company about what assistive devices your plan covers. °· Contact national or local amputee charities to find out if you are eligible for scholarships and medical equipment for people in need. °· People in military service may be eligible for financial assistance   or programs that support amputees. Rehabilitation A rehabilitation program can help you regain mobility and independence. Your rehabilitation team may include:  Physicians and nurses.  Physical and occupational therapists.  Prosthesis specialists.  Social workers.  Mental health professionals.  Diet and nutrition specialists. Follow these instructions at home: Managing pain  Work with your  health care provider to manage any residual or phantom limb pain. This may include: ? Pain medicine. ? Physical therapy. ? Techniques that help retrain the brain and nervous system (movement representation techniques). ? Control and instrumentation engineer. For this treatment, stimulation is applied to different parts of your stump, and you describe what you feel. ? Biofeedback. This involves using monitors that alert you to changes in your breathing, heart rate, skin temperature, or muscle activity, and using relaxation techniques to reverse those changes. ? Acupuncture. Eating and drinking  Work with a dietitian to develop an eating plan that helps you maintain a healthy body weight. Your plan may include a balance of: ? Fresh fruits and vegetables. ? Whole grains. ? Lean meats. ? Low-fat dairy. ? Healthy fats, such as fish, nuts, avocado, or olive oil.  Do not drink alcohol to cope with stress. General instructions  Take over-the-counter and prescription medicines only as told by your health care provider.  Do not use any products that contain nicotine or tobacco, such as cigarettes and e-cigarettes. If you need help quitting, ask your health care provider.  Keep all follow-up visits as told by your health care provider. This is important. Where to find more information You can find more information about living with an amputation from:  Amputee Coalition: www.amputee-coalition.org  Amputee Support Groups: amputee.supportgroups.[place]: www.nationalamputation.[facility] on Health, Physical Activity and Disability: www.nchpad.org  Disabled Sports [place]: www.disabledsportsusa.org Contact a health care provider if:  Your pain does not improve with medicine or treatment.  You have trouble managing your emotions about losing an extremity. Get help right away if you:  Have severe pain. Summary  It will take time for you to heal and get used to  living with an amputation.  Look for a local or online support group for people living with an amputation.  Work with your health care provider to manage any phantom limb pain. This information is not intended to replace advice given to you by your health care provider. Make sure you discuss any questions you have with your health care provider. Document Released: 11/18/2001 Document Revised: 01/23/2017 Document Reviewed: 01/23/2017 Elsevier Interactive Patient Education  2019 [employer].

## 2018-04-03 ENCOUNTER — Other Ambulatory Visit: Payer: Self-pay | Admitting: *Deleted

## 2018-04-03 ENCOUNTER — Other Ambulatory Visit: Payer: Self-pay | Admitting: Pharmacist

## 2018-04-03 ENCOUNTER — Other Ambulatory Visit: Payer: Self-pay

## 2018-04-03 DIAGNOSIS — I10 Essential (primary) hypertension: Secondary | ICD-10-CM

## 2018-04-03 NOTE — Consult Note (Signed)
[facility] Care Management hospital liaison follow up.   Chart reviewed. [name] discharged to [facility] on 04/02/2018. PMT consult was declined by patient/wife.   Will make referral to [place] for follow up at [facility] on 04/02/2018 since patient was active with [facility] prior to hospitalization.   [name], MSN-Ed, RN,BSN [facility] Liaison [phone]

## 2018-04-03 NOTE — Patient Outreach (Signed)
[facility]) Care Management  04/03/2018  [name] [birth year] [id number]   Notification from [facility] Liaison, [name] stating that the patient is discharging to [facility] today.  PLAN: -Will follow up with patient when he is discharged home -Will close case per protocol at this time  Regina Eck, PharmD, [place]  [phone]

## 2018-04-03 NOTE — Patient Outreach (Signed)
[facility]) Care Management  04/03/2018  [name] Apr 14, 1933 [id number]   Member with recent hospitalization from 03-15-2018 to 1-9-202 at [facility] for CHF and AKI. PMH: HTN/; CHF; AFIB with RVR; CAD s/p angioplasty; CAP; Acute Hypoxemic Respiratory failure; GERD; Seizure d/o; R foot halllux rigidity; Hypothyroidism  Notified via [place] that member discharged to [facility] on 04/02/2018 and Liaison consulting [place] LCSW for follow up at [facility] on 04/02/2018 since patient was active with [facility] prior to hospitalization.  Will follow up with member when discharged to home pending discharge disposition.   [name], RN-BSN  [facility] Care Management  Community Care Management Coordinator  [phone] [email or web address]

## 2018-04-07 ENCOUNTER — Ambulatory Visit (INDEPENDENT_AMBULATORY_CARE_PROVIDER_SITE_OTHER): Payer: Medicare Other | Admitting: Physician Assistant

## 2018-04-07 ENCOUNTER — Encounter (INDEPENDENT_AMBULATORY_CARE_PROVIDER_SITE_OTHER): Payer: Self-pay | Admitting: Physician Assistant

## 2018-04-07 ENCOUNTER — Encounter: Payer: Self-pay | Admitting: *Deleted

## 2018-04-07 ENCOUNTER — Other Ambulatory Visit: Payer: Self-pay | Admitting: *Deleted

## 2018-04-07 VITALS — Ht 69.0 in | Wt 165.0 lb

## 2018-04-07 DIAGNOSIS — Z89511 Acquired absence of right leg below knee: Secondary | ICD-10-CM

## 2018-04-07 DIAGNOSIS — N183 Chronic kidney disease, stage 3 unspecified: Secondary | ICD-10-CM

## 2018-04-07 MED ORDER — OXYCODONE HCL 5 MG PO CAPS: 5.0000 mg | ORAL_CAPSULE | ORAL | 0 refills | Status: DC | PRN

## 2018-04-07 NOTE — Patient Outreach (Signed)
[facility]) Care Management  04/07/2018  MILLEDGE GERDING 1933/03/26 [id number]   CSW was able to meet with patient and patient's wife, Dyllen Menning today to perform the initial assessment on patient, as well as assess and assist with social work needs and services.  CSW met with patient and Mrs. Caslin at [facility] where patient currently resides to receive short-term rehabilitative services.  CSW introduced self, explained role and types of services provided through [place] [facility] ([place] [facility]).  CSW further explained to patient and Mrs. Lipton that CSW works with [name], [facility], also with [place] [facility]. CSW then explained the reason for the visit, indicating that Ms. [place] thought that patient would benefit from social work services and resources to assist with discharge planning from the skilled nursing facility.  CSW obtained two HIPAA compliant identifiers from patient, which included patient's name and date of birth. Patient and Mrs. Juarez admitted that they would like for patient to be able to return home to live independently at time of discharge, but that patient will definitely need aggressive and extensive therapies before being able to do so.  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  Mrs. Kaupp reported that patient had become "very depressed" while hospitalized, as a result of the amputation, and actually admitted that he wanted to die.  Mrs. Rogerson went on to say that patient had expressed thoughts of killing himself, but that his mood has since improved and that he is actually very motivated for treatment.  Patient denied feeling suicidal or homicidal today while CSW was present.  Patient also denied the need for counseling and supportive services at this time, but agreed to  revisit psychotherapeutic services once he returns home to live with Mrs. Hammitt. Mrs. Bartow indicated that she and patient are wanting home health services to be resumed through [facility], as they have used them twice prior and were very pleased with their services.  CSW agreed to assist with this process, as well as coordinate efforts with the social worker/discharge planning coordinator at [facility] to help order patient's prothesis, as well as any additional durable medical equipment that patient may require.  Patient voiced a great deal of concern about how she will be able to transport patient to and from his physician appointments, at least initially. CSW agreed to make a referral for patient to [employer], social work Social worker, also with [facility], clinical (histocompatibility and immunogenetics), who will be able to assist patient and Mrs. Michetti with transportation services.  CSW will follow-up with patient and Mrs. Stehle again next week (Monday, April 14, 2018 at 11:00AM) to assess and assist with discharge planning needs and services. [name], BSW, MSW, LCSW  Licensed Education officer, [facility]  Mailing [place] [address], [place], [place] [address] Physical Address-[address], [place], [place] [address] Toll Free Main # [phone] Fax # [phone] Cell # [phone]  Office # [phone] [email or web address]

## 2018-04-08 ENCOUNTER — Other Ambulatory Visit: Payer: Self-pay

## 2018-04-08 NOTE — Patient Outreach (Signed)
[name] Care Management  04/08/2018  [name] [birth year] [id number]   BSW received in-basket message from CSW, [name], to contact patient's wife regarding transportation resources upon discharge back home.   [name] was referred for transportation prior to most recent hospitalization but his wife explained that the need is actually for someone to come into the home to assist with transfer on the days that he has MD appointments.  [name] reported the same need today but said she is aware that transportation agencies in the area are only curb to curb or door to door.  BSW reminded her of previous conversation where we discussed possibly utilizing church members to assist.  We also discussed the possibility of home health CNA assisting if MD appointments can be scheduled the same day as CNA visits.   [name] thanked BSW for call and reiterating resources previously discussed.  BSW is signing off at this time but encouraged [name] to call with any additional needs or concerns.  BSW also encouraged her to communicate with SNF Social Worker regarding needed resources.   Ronn Melena, BSW Social Worker [phone]

## 2018-04-09 ENCOUNTER — Encounter (INDEPENDENT_AMBULATORY_CARE_PROVIDER_SITE_OTHER): Payer: Self-pay | Admitting: Physician Assistant

## 2018-04-09 NOTE — Progress Notes (Signed)
Office Visit Note   Patient: [name]           Date of Birth: 19-Dec-1933           MRN: [id number] Visit Date: 04/07/2018              Requested by: [name], [place], [place] [address] PCP: [name], MD  Chief Complaint  Patient presents with  . Right Leg - Routine Post Op    03/28/2018 right BKA  Pt came in today the wound vac. Was attached to leg.      HPI: The patient is an 85 year old gentleman here with his wife for postoperative follow-up following right transtibial amputation on 03/28/2022 chronic osteomyelitis and gangrene and ulceration of his right ankle and foot.  He was discharged to [facility] skilled nursing facility.  He presents for his first postoperative follow-up.  Assessment & Plan: Visit Diagnoses:  1. Acquired absence of right lower extremity below knee ([place])   2. CKD (chronic kidney disease) stage 3, GFR 30-59 ml/min (HCC)     Plan: The Praveena VAC was removed this visit.  Orders were sent to the nursing home to wash the right transtibial amputation site daily with soap and water reapply a dry gauze over the incisional area and Ace wrap and then apply a stump shrinker stocking on top of this for edema control.  Recommended use of the stump protector whenever out of the bed.  Also recommended the right knee to be in full extension with no pillows under the knee and this was reinforced with the patient and wife.  They will follow-up in 2 weeks or sooner should he have difficulties in the interim.  Follow-Up Instructions: Return in about 2 weeks (around 04/21/2018).   Ortho Exam  Patient is alert, oriented, no adenopathy, well-dressed, normal affect, normal respiratory effort. The right transtibial amputation site- Praveena VAC was removed today.  There is scant bleeding from the incisional area with staples intact.  There is no signs of cellulitis or infection.  He lacks a few degrees of knee extension and we discussed the  importance of having full extension for prosthetic use.  There is some ecchymosis or perhaps thrombosed varicosity over the anterior residual limb.  Imaging: No results found.   Labs: Lab Results  Component Value Date   HGBA1C 5.7 (H) 03/26/2018   HGBA1C 5.0 07/12/2017   HGBA1C 5.8 (H) 07/06/2015   ESRSEDRATE 24 (H) 03/24/2018   ESRSEDRATE 15 04/18/2017   ESRSEDRATE 5 09/17/2007   CRP 1.8 (H) 03/24/2018   CRP <0.8 04/24/2017   LABURIC 6.0 03/09/2016   REPTSTATUS 03/27/2018 FINAL 03/26/2018   GRAMSTAIN  07/15/2017    WBC PRESENT,BOTH PMN AND MONONUCLEAR NO ORGANISMS SEEN Performed at [facility], [place] [address] [place], [place] [address]    CULT  03/26/2018    NO GROWTH Performed at [facility] [address] [place], [place] [address]    LABORGA ESCHERICHIA COLI (A) 09/14/2017     Lab Results  Component Value Date   ALBUMIN 2.5 (L) 03/31/2018   ALBUMIN 2.7 (L) 03/30/2018   ALBUMIN 2.8 (L) 03/29/2018   PREALBUMIN 36 (H) 11/22/2017   PREALBUMIN 28.8 04/24/2017   LABURIC 6.0 03/09/2016    Body mass index is 24.37 kg/m.  Orders:  No orders of the defined types were placed in this encounter.  Meds ordered this encounter  Medications  . oxycodone (OXY-IR) 5 MG capsule  Sig: Take 1 capsule (5 mg total) by mouth every 4 (four) hours as needed.    Dispense:  42 capsule    Refill:  0     Procedures: No procedures performed  Clinical Data: No additional findings.  ROS:  All other systems negative, except as noted in the HPI. Review of Systems  Objective: Vital Signs: Ht 5\' 9"  (1.753 m)   Wt 165 lb (74.8 kg)   BMI 24.37 kg/m   Specialty Comments:  No specialty comments available.  PMFS History: Patient Active Problem List   Diagnosis Date Noted  . AKI (acute kidney injury) ([place])   . S/P BKA (below knee amputation) unilateral, right ([place])   . Unilateral complete BKA, right, initial encounter ([place])   . Tachypnea   . Atrial  fibrillation with rapid ventricular response ([place])   . Post-operative pain   . Subacute osteomyelitis, right ankle and foot ([place])   . Paralysis of right lower extremity ([place])   . TIA (transient ischemic attack) 03/24/2018  . Encephalopathy 03/24/2018  . Cellulitis 03/15/2018  . Hypernatremia 03/15/2018  . Persistent atrial fibrillation 11/10/2017  . Coronary artery disease involving native coronary artery of native heart without angina pectoris 11/10/2017  . Severe muscle deconditioning 11/10/2017  . Hemorrhage 10/22/2017  . CAD S/P percutaneous coronary angioplasty 10/22/2017  . Acute hypoxemic respiratory failure ([place]) 07/17/2017  . Aspiration syndrome, subsequent encounter 07/11/2017  . Aspiration pneumonia ([place]) 07/01/2017  . Acute encephalopathy 07/01/2017  . Thrombocytopenia ([place]) 07/01/2017  . Chronic diastolic (congestive) heart failure ([place]) 07/01/2017  . Anemia of chronic disease 07/01/2017  . Troponin level elevated 07/01/2017  . Chronic atrial fibrillation 07/01/2017  . Goals of care, counseling/discussion   . Palliative care by specialist   . Acute metabolic encephalopathy 18/84/1660  . CKD (chronic kidney disease) stage 3, GFR 30-59 ml/min (HCC)   . CAP (community acquired pneumonia) 04/17/2017  . Atrial fibrillation with RVR ([place]) 04/17/2017  . Hallux rigidus, right foot 03/10/2016  . Lumbosacral spondylosis without myelopathy 10/29/2015  . Memory difficulty 09/22/2015  . Abnormality of gait 09/22/2015  . Hyperglycemia 07/06/2015  . Chronic pain 07/06/2015  . Chronic insomnia 03/30/2015  . Transient alteration of awareness 03/30/2015  . Abnormal liver function   . Altered mental status 01/31/2015  . Essential hypertension 01/31/2015  . Constipation 01/31/2015  . Hypothyroidism 01/31/2015  . Seizure disorder ([place]) 01/31/2015  . Bladder outlet obstruction 01/31/2015  . GERD (gastroesophageal reflux disease) 01/31/2015  . Chronic back pain 01/31/2015  . Acute  kidney injury ([place]) 01/31/2015   Past Medical History:  Diagnosis Date  . Abnormality of gait 09/22/2015  . Arthritis   . Atrial fibrillation ([place])   . CAD (coronary artery disease)    Stent to RCA, Penta stent, 99% reduced to 0% 2002.  . Cancer ([place])    skin CA removed from back  . Chronic insomnia 03/30/2015  . Complication of anesthesia    trouble waking up  . GERD (gastroesophageal reflux disease)   . Hypercholesteremia   . Hypertension   . Hypothyroidism   . Memory difficulty 09/22/2015  . Osteoarthritis   . Seizures ([place])   . Sepsis ([place]) 05/2017  . Transient alteration of awareness 03/30/2015  . Vertigo    hx of    Family History  Problem Relation Age of Onset  . Hypertension Mother   . Cancer Mother   . Kidney failure Father   . Heart disease Father     Past Surgical  History:  Procedure Laterality Date  . AMPUTATION Right 03/28/2018   Procedure: AMPUTATION BELOW KNEE;  Surgeon: Newt Minion, MD;  Location: [facility];  Service: Orthopedics;  Laterality: Right;  . BACK SURGERY    . EYE SURGERY     Bilateral Cataract surgery   . HERNIA REPAIR    . I&D EXTREMITY Right 05/10/2015   Procedure: IRRIGATION AND DEBRIDEMENT EXTREMITY;  Surgeon: Leanora Cover, MD;  Location: [facility];  Service: Orthopedics;  Laterality: Right;  . KNEE ARTHROPLASTY     right knee X 2; left knee once  . LAMINECTOMY     X 6  . POSTERIOR CERVICAL FUSION/FORAMINOTOMY  01/28/2012   Procedure: POSTERIOR CERVICAL FUSION/FORAMINOTOMY LEVEL 3;  Surgeon: [name], MD;  Location: [facility];  Service: Neurosurgery;  Laterality: Left;  Posterior Cervical Five-Thoracic One Fusion, Arthrodesis with LEFT Cervical Seven-thoracic One Laminectomy, Foraminotomy and Resection of Synovial Cyst  . POSTERIOR CERVICAL FUSION/FORAMINOTOMY N/A 01/29/2013   Procedure: POSTERIOR CERVICAL FUSION/FORAMINOTOMY LEVEL 1 and C2-5 Posteriolateral Arthrodesis;  Surgeon: [name], MD;  Location:  [facility]UR[facility] ORS;  Service: Neurosurgery;  Laterality: N/A;  C2-C3 Laminectomy C2-C3 posterior cervical arthrodesis  . TONSILLECTOMY     Social History   Occupational History  . Occupation: retired Software engineer  Tobacco Use  . Smoking status: Former Research scientist (life sciences)  . Smokeless tobacco: Never Used  Substance and Sexual Activity  . Alcohol use: Yes    Comment: rare  . Drug use: No  . Sexual activity: Not Currently

## 2018-04-10 ENCOUNTER — Other Ambulatory Visit: Payer: Self-pay

## 2018-04-10 NOTE — Patient Outreach (Addendum)
[name]) Care Management  04/10/2018  [name] 11/04/1933 [id number]  Member with recent hospitalization from 03-15-2018 to 1-9-202 at [facility] for confusion, sepsis secondary RLE cellulits. Member readmitted to hospital for AMS and seizure d/o. 03-28-2018 Member with R-BKA due to osteomyelitis R ankle/foot. Member also with R CVA.   PMH: HTN/; CHF; AFIB with RVR; CAD s/p angioplasty; CAP; Acute Hypoxemic Respiratory failure; GERD; Seizure d/o; R foot halllux rigidity; Hypothyroidism.   Notified via [place] that member discharged from hospital to [facility] on 04/02/2018.   Multidisciplinary Case Conference today to discuss difficulties managing care and 30 day readmission. Discussed [name] services as well as communication with family on 03-24-2018 concerning member's status. Discussed that member continues care at [name]. Advised for [name] RN and SW to follow up in one week for discharge plan.  Will follow up with member when discharged to home pending discharge disposition and needs. RN CM available for any concerns and needs.  Robert Mola "ANN" [name], RN-BSN  [facility] Care Management  Community Care Management Coordinator  [phone] [email or web address]

## 2018-04-14 ENCOUNTER — Other Ambulatory Visit: Payer: Self-pay | Admitting: *Deleted

## 2018-04-14 ENCOUNTER — Other Ambulatory Visit: Payer: Self-pay

## 2018-04-14 NOTE — Patient Outreach (Signed)
[facility]) Care Management  04/14/2018  [name] 1934-03-02 [id number]   CSW was able to meet with patient and patient's wife, Amiir Heckard today at [facility] [place] where patient currently resides to receive short-term rehabilitative services, to perform a routine visit.  Patient had just completed therapies at the time of CSW's arrival, so he admitted to feeling weak and fatigued, just wanting to rest.  Mrs. Sortino was able to provide CSW with an update on patient's condition and progress with therapies.    Patient reported that he is no longer experiencing symptoms of depression, now that he is becoming more mobile and capable of performing certain tasks with minimal assistance.  Patient indicated that he is motivated for treatment, "just overwhelmed with the whole process".  CSW was able to provide brief counseling and supportive services to patient, as well as positive motivation techniques.  Patient admitted that his greatest source of motivation comes from his ability to be able to return home.  Mrs. Christianson was appreciative of CSW making a referral to [employer], social work Social worker, also with [employer], on behalf of patient and his request to receive information about transportation resources.  Patient and Mrs. Badal are agreeable to having CSW assist them with arranging home health services, as well as durable medical equipment, for patient at time of discharge from [facility].  CSW will plan to meet with patient and Mrs. Rupe again for the next scheduled visit on Monday, April 21, 2018 at [place], [place], MSW, [place]  Licensed Clinical Social Worker  [place]  Mailing [place] [address] [place], [place] [address] Physical Address-[address], [place], [place] [address] Toll Free Main # [phone] Fax  # [phone] Cell # [phone]  Office # [phone] [email or web address]

## 2018-04-14 NOTE — Patient Outreach (Signed)
[facility]) Care Management  04/14/2018  KESHON MARKOVITZ 12-04-1933 [id number]   Email sent to [name], [facility] Clinical SW II regarding member/spouse disposition plans post Rehab at [facility] [place].   Will await response from [place] "ANN" [name], RN-BSN  [facility] Care Management  [facility] Management Coordinator  [phone] [email or web address]

## 2018-04-15 ENCOUNTER — Encounter: Payer: Self-pay | Admitting: Vascular Surgery

## 2018-04-15 ENCOUNTER — Encounter (HOSPITAL_COMMUNITY): Payer: Self-pay

## 2018-04-17 ENCOUNTER — Other Ambulatory Visit: Payer: Self-pay

## 2018-04-17 NOTE — Patient Outreach (Signed)
[facility]) Care Management  04/17/2018  [name] Jul 20, 1933 [id number]  Member with recent hospitalization from 03-15-2018 to 1-9-202 at [facility] for confusion, sepsis secondary RLE cellulits. Member readmitted to hospital for AMS and seizure d/o. 03-28-2018 Member with R-BKA due to osteomyelitis R ankle/foot. Member also with R CVA.   PMH: HTN/; CHF; AFIB with RVR; CAD s/p angioplasty; CAP; Acute Hypoxemic Respiratory failure; GERD; Seizure d/o; R foot halllux rigidity; Hypothyroidism.   Notified via [place] that memberdischarged from hospital to [facility] on 04/02/2018.   Multidisciplinary Case Conference today to discuss difficulties managing care and 30 day readmission.  Discussed that member continues care at [facility]. Per [facility] SW,  Patient and Mrs. Halbig are agreeable to having CSW assist them with arranging home health services, as well as durable medical equipment, for patient at time of discharge from [facility].  Will follow up with member when discharged to home pending discharge disposition and needs. RN CM available for any concerns and needs.   Robert Mola "ANN" [name], RN-BSN  [facility] Care Management  Community Care Management Coordinator  [phone] [name]

## 2018-04-20 ENCOUNTER — Emergency Department (HOSPITAL_COMMUNITY): Payer: Medicare Other

## 2018-04-20 ENCOUNTER — Encounter (HOSPITAL_COMMUNITY): Payer: Self-pay

## 2018-04-20 ENCOUNTER — Other Ambulatory Visit: Payer: Self-pay

## 2018-04-20 ENCOUNTER — Emergency Department (HOSPITAL_COMMUNITY)
Admission: EM | Admit: 2018-04-20 | Discharge: 2018-04-20 | Disposition: A | Discharge status: Home / Self Care | Payer: Medicare Other | Attending: Emergency Medicine | Admitting: Emergency Medicine

## 2018-04-20 DIAGNOSIS — I2510 Atherosclerotic heart disease of native coronary artery without angina pectoris: Secondary | ICD-10-CM | POA: Diagnosis not present

## 2018-04-20 DIAGNOSIS — I130 Hypertensive heart and chronic kidney disease with heart failure and stage 1 through stage 4 chronic kidney disease, or unspecified chronic kidney disease: Secondary | ICD-10-CM | POA: Diagnosis not present

## 2018-04-20 DIAGNOSIS — Z85828 Personal history of other malignant neoplasm of skin: Secondary | ICD-10-CM | POA: Insufficient documentation

## 2018-04-20 DIAGNOSIS — Z87891 Personal history of nicotine dependence: Secondary | ICD-10-CM | POA: Diagnosis not present

## 2018-04-20 DIAGNOSIS — Z79899 Other long term (current) drug therapy: Secondary | ICD-10-CM | POA: Insufficient documentation

## 2018-04-20 DIAGNOSIS — I5032 Chronic diastolic (congestive) heart failure: Secondary | ICD-10-CM | POA: Diagnosis not present

## 2018-04-20 DIAGNOSIS — Z7982 Long term (current) use of aspirin: Secondary | ICD-10-CM | POA: Diagnosis not present

## 2018-04-20 DIAGNOSIS — N183 Chronic kidney disease, stage 3 (moderate): Secondary | ICD-10-CM | POA: Diagnosis not present

## 2018-04-20 DIAGNOSIS — R1310 Dysphagia, unspecified: Secondary | ICD-10-CM | POA: Diagnosis not present

## 2018-04-20 DIAGNOSIS — Z8673 Personal history of transient ischemic attack (TIA), and cerebral infarction without residual deficits: Secondary | ICD-10-CM | POA: Insufficient documentation

## 2018-04-20 DIAGNOSIS — Z96651 Presence of right artificial knee joint: Secondary | ICD-10-CM | POA: Insufficient documentation

## 2018-04-20 DIAGNOSIS — E039 Hypothyroidism, unspecified: Secondary | ICD-10-CM | POA: Diagnosis not present

## 2018-04-20 NOTE — ED Provider Notes (Signed)
[facility] DEPT Provider Note   CSN: [id number] Arrival date & time: 04/20/18  1224     History   Chief Complaint Chief Complaint  Patient presents with  . Choked on Pill    HPI [name] is a 85 y.o. male.  The history is provided by the patient and the spouse. No language interpreter was used.     85 year old male with history of aspiration syndrome, seizure disorder, CKD, hypertension brought here via EMS from [facility] facility with complaints of dysphagia.  Today, while patient was his medication applesauce, given by his nurse,` choked on the pills having to spit it up.  He felt that he was given the medication too quickly.  AFter trying to cough up the content, pt and wife felt that they should come to the ER to ensure no evidence of aspiration that can lead to pneumonia.  Per wife, patient has been diagnosed with aspiration pneumonia 3 times this past year.  One incident was when he had his dental cleansing and may have swallowed the saliva during the procedure causing sickness.  The other two episodes were at the nursing facility when he was given medication while laying down.  Due to that, family was extra cautious.  Pt haven't cough since but did report increase phlegm production and coughing for the past few days.  Denies any fever or chills, confusion, pain in his chest, trouble breathing, or trouble swallowing.  Past Medical History:  Diagnosis Date  . Abnormality of gait 09/22/2015  . Arthritis   . Atrial fibrillation ([place])   . CAD (coronary artery disease)    Stent to RCA, Penta stent, 99% reduced to 0% 2002.  . Cancer ([place])    skin CA removed from back  . Chronic insomnia 03/30/2015  . Complication of anesthesia    trouble waking up  . GERD (gastroesophageal reflux disease)   . Hypercholesteremia   . Hypertension   . Hypothyroidism   . Memory difficulty 09/22/2015  . Osteoarthritis   . Seizures ([place])   . Sepsis  ([place]) 05/2017  . Transient alteration of awareness 03/30/2015  . Vertigo    hx of    Patient Active Problem List   Diagnosis Date Noted  . AKI (acute kidney injury) ([place])   . S/P BKA (below knee amputation) unilateral, right ([place])   . Unilateral complete BKA, right, initial encounter ([place])   . Tachypnea   . Atrial fibrillation with rapid ventricular response ([place])   . Post-operative pain   . Subacute osteomyelitis, right ankle and foot ([place])   . 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 TIA (transient ischemic attack) 03/24/2018  . Encephalopathy 03/24/2018  . Cellulitis 03/15/2018  . Hypernatremia 03/15/2018  . Persistent atrial fibrillation 11/10/2017  . Coronary artery disease involving native coronary artery of native heart without angina pectoris 11/10/2017  . Severe muscle deconditioning 11/10/2017  . Hemorrhage 10/22/2017  . CAD S/P percutaneous coronary angioplasty 10/22/2017  . Acute hypoxemic respiratory failure ([place]) 07/17/2017  . Aspiration syndrome, subsequent encounter 07/11/2017  . Aspiration pneumonia ([place]) 07/01/2017  . Acute encephalopathy 07/01/2017  . Thrombocytopenia ([place]) 07/01/2017  . Chronic diastolic (congestive) heart failure ([place]) 07/01/2017  . Anemia of chronic disease 07/01/2017  . Troponin level elevated 07/01/2017  . Chronic atrial fibrillation 07/01/2017  . Goals of care, counseling/discussion   . Palliative care by specialist   . Acute metabolic encephalopathy 02/58/5277  . CKD (chronic kidney disease) stage  3, GFR 30-59 ml/min (HCC)   . CAP (community acquired pneumonia) 04/17/2017  . Atrial fibrillation with RVR ([place]) 04/17/2017  . Hallux rigidus, right foot 03/10/2016  . Lumbosacral spondylosis without myelopathy 10/29/2015  . Memory difficulty 09/22/2015  . Abnormality of gait 09/22/2015  . Hyperglycemia 07/06/2015  . Chronic pain 07/06/2015  . Chronic insomnia 03/30/2015  . Transient alteration of awareness 03/30/2015  . Abnormal  liver function   . Altered mental status 01/31/2015  . Essential hypertension 01/31/2015  . Constipation 01/31/2015  . Hypothyroidism 01/31/2015  . Seizure disorder ([place]) 01/31/2015  . Bladder outlet obstruction 01/31/2015  . GERD (gastroesophageal reflux disease) 01/31/2015  . Chronic back pain 01/31/2015  . Acute kidney injury ([place]) 01/31/2015    Past Surgical History:  Procedure Laterality Date  . AMPUTATION Right 03/28/2018   Procedure: AMPUTATION BELOW KNEE;  Surgeon: Newt Minion, MD;  Location: [facility];  Service: Orthopedics;  Laterality: Right;  . BACK SURGERY    . EYE SURGERY     Bilateral Cataract surgery   . HERNIA REPAIR    . I&D EXTREMITY Right 05/10/2015   Procedure: IRRIGATION AND DEBRIDEMENT EXTREMITY;  Surgeon: Leanora Cover, MD;  Location: [facility];  Service: Orthopedics;  Laterality: Right;  . KNEE ARTHROPLASTY     right knee X 2; left knee once  . LAMINECTOMY     X 6  . POSTERIOR CERVICAL FUSION/FORAMINOTOMY  01/28/2012   Procedure: POSTERIOR CERVICAL FUSION/FORAMINOTOMY LEVEL 3;  Surgeon: [name], MD;  Location: [facility];  Service: Neurosurgery;  Laterality: Left;  Posterior Cervical Five-Thoracic One Fusion, Arthrodesis with LEFT Cervical Seven-thoracic One Laminectomy, Foraminotomy and Resection of Synovial Cyst  . POSTERIOR CERVICAL FUSION/FORAMINOTOMY N/A 01/29/2013   Procedure: POSTERIOR CERVICAL FUSION/FORAMINOTOMY LEVEL 1 and C2-5 Posteriolateral Arthrodesis;  Surgeon: [name], MD;  Location: [facility];  Service: Neurosurgery;  Laterality: N/A;  C2-C3 Laminectomy C2-C3 posterior cervical arthrodesis  . TONSILLECTOMY          Home Medications    Prior to Admission medications   Medication Sig Start Date End Date Taking? Authorizing Provider  acetaminophen (TYLENOL) 325 MG tablet Take 2 tablets (650 mg total) by mouth every 6 (six) hours as needed for mild pain (pain score 1-3 or temp > 100.5). 04/02/18    Debbe Odea, MD  alendronate (FOSAMAX) 70 MG tablet Take 70 mg by mouth once a week. On Wednesday. Remain upright for 30-60 minutes. 01/06/12   [provider]  Amino Acids-Protein Hydrolys (FEEDING SUPPLEMENT, PRO-STAT SUGAR FREE 64,) LIQD Take 30 mLs by mouth 2 (two) times daily. 07/05/17   Lavina Hamman, MD  aspirin EC 325 MG EC tablet Take 1 tablet (325 mg total) by mouth daily. 04/03/18   Debbe Odea, MD  bisacodyl (DULCOLAX) 10 MG suppository Place 1 suppository (10 mg total) rectally daily as needed for moderate constipation. 04/02/18   Debbe Odea, MD  calcium carbonate (OS-CAL) 600 MG tablet Take 600 mg by mouth daily after breakfast.     [provider]  cyanocobalamin 500 MCG tablet Take 500 mcg by mouth at bedtime.    [provider]  diclofenac sodium (VOLTAREN) 1 % GEL Apply 2 g topically daily as needed (pain).    [provider]  diltiazem (CARDIZEM CD) 300 MG 24 hr capsule TAKE 1 CAPSULE(300 MG) BY MOUTH EVERY MORNING. DO NOT CRUSH Patient taking differently: Take 300 mg by mouth daily.  12/03/17   Croitoru, Maybee,  MD  divalproex (DEPAKOTE) 250 MG DR tablet Take 250 mg by mouth every morning.    [provider]  divalproex (DEPAKOTE) 250 MG DR tablet Take 500 mg by mouth every evening.     [provider]  docusate sodium (COLACE) 100 MG capsule Take 1 capsule (100 mg total) by mouth 2 (two) times daily. 04/02/18   Debbe Odea, MD  doxazosin (CARDURA) 4 MG tablet Take 4 mg by mouth every evening.     [provider]  feeding supplement, ENSURE ENLIVE, (ENSURE ENLIVE) LIQD Take 237 mLs by mouth daily. 04/02/18   Debbe Odea, MD  ferrous sulfate 325 (65 FE) MG tablet Take 1 tablet (325 mg total) by mouth 2 (two) times daily with a meal. 06/28/17   Skeet Latch, MD  fluticasone [facility]) 50 MCG/ACT nasal spray Place 2 sprays into both nostrils daily as needed for allergies or rhinitis.    [provider]    furosemide (LASIX) 40 MG tablet Take 0.5 tablets (20 mg total) by mouth every morning. Or per cardiologist instructions since patient cannot be weighed 04/02/18   Debbe Odea, MD  lactobacillus acidophilus & bulgar (LACTINEX) chewable tablet Chew 1 tablet by mouth 3 (three) times daily with meals. 03/19/18   Roxan Hockey, MD  levothyroxine (SYNTHROID, LEVOTHROID) 150 MCG tablet Take 150 mcg by mouth daily before breakfast. 12/27/11   [provider]  lidocaine (LIDODERM) 5 % Place 1 patch onto the skin daily as needed. Remove & Discard patch within 12 hours or as directed by MD Patient taking differently: Place 1 patch onto the skin daily as needed (pain). Remove & Discard patch within 12 hours or as directed by MD  06/01/17   Cristal Ford, DO  magnesium citrate SOLN Take 296 mLs (1 Bottle total) by mouth once as needed for severe constipation. 04/02/18   Debbe Odea, MD  Menthol-Methyl Salicylate (MUSCLE RUB EX) Apply 1 application topically daily as needed (shoulder pain).    [provider]  methocarbamol (ROBAXIN) 500 MG tablet Take 1 tablet (500 mg total) by mouth every 6 (six) hours as needed for muscle spasms. 04/02/18   Debbe Odea, MD  metoprolol tartrate (LOPRESSOR) 25 MG tablet Take 1 tablet (25 mg total) by mouth 2 (two) times daily. Take with or immediately following a meal. 11/06/17   Croitoru, Mihai, MD  Multiple Vitamins-Minerals (CERTAVITE/ANTIOXIDANTS) TABS Take 1 tablet by mouth every evening.     [provider]  nitroGLYCERIN (NITROSTAT) 0.4 MG SL tablet Place 0.4 mg under the tongue every 5 (five) minutes as needed for chest pain. Max of 3 tablets    [provider]  omeprazole (PRILOSEC) 20 MG capsule Take 20 mg by mouth daily before breakfast.  12/23/11   [provider]  oxycodone (OXY-IR) 5 MG capsule Take 1 capsule (5 mg total) by mouth every 4 (four) hours as needed. 04/07/18   Rayburn, Neta Mends, PA-C  polyethylene  glycol (MIRALAX / GLYCOLAX) packet Take 17 g by mouth daily as needed for mild constipation. 04/02/18   Debbe Odea, MD  potassium chloride SA (K-DUR,KLOR-CON) 20 MEQ tablet TAKE 1 TABLET BY MOUTH DAILY ALONG WITH LASIX 40MG  OR AS DIRECTED BY CARDIOLOGIST 03/11/18   Croitoru, Mihai, MD  pravastatin (PRAVACHOL) 40 MG tablet Take 40 mg by mouth every evening.  10/29/14   [provider]  predniSONE (DELTASONE) 5 MG tablet Take 2 tablets (10 mg total) by mouth daily with breakfast. 04/02/18   Debbe Odea,  MD  sodium chloride (OCEAN) 0.65 % SOLN nasal spray Place 1 spray into both nostrils as needed for congestion.    [provider]  VITAMIN D, ERGOCALCIFEROL, PO Take 1 capsule by mouth daily.     [provider]  ZETIA 10 MG tablet Take 5 mg by mouth at bedtime. (0.5 tablet) 10/15/11   [provider]    Family History Family History  Problem Relation Age of Onset  . Hypertension Mother   . Cancer Mother   . Kidney failure Father   . Heart disease Father     Social History Social History   Tobacco Use  . Smoking status: Former Research scientist (life sciences)  . Smokeless tobacco: Never Used  Substance Use Topics  . Alcohol use: Yes    Comment: rare  . Drug use: No     Allergies   Eliquis [apixaban]; Demerol [meperidine]; and Keppra [levetiracetam]   Review of Systems Review of Systems  All other systems reviewed and are negative.    Physical Exam Updated Vital Signs BP 128/71   Pulse 82   Temp 98.1 F (36.7 C) (Oral)   Resp 16   SpO2 99%   Physical Exam Vitals signs and nursing note reviewed.  Constitutional:      General: He is not in acute distress.    Appearance: He is well-developed.  HENT:     Head: Atraumatic.     Mouth/Throat:     Mouth: Mucous membranes are moist.  Eyes:     Conjunctiva/sclera: Conjunctivae normal.  Neck:     Musculoskeletal: Neck supple.  Cardiovascular:     Rate and Rhythm: Normal rate and regular rhythm.  Pulmonary:       Effort: Pulmonary effort is normal.     Breath sounds: Normal breath sounds. No wheezing, rhonchi or rales.  Abdominal:     Palpations: Abdomen is soft.     Tenderness: There is no abdominal tenderness.  Skin:    Findings: No rash.  Neurological:     Mental Status: He is alert and oriented to person, place, and time.      ED Treatments / Results  Labs (all labs ordered are listed, but only abnormal results are displayed) Labs Reviewed - No data to display  EKG None  Radiology Dg Chest 2 View  Result Date: 04/20/2018 CLINICAL DATA:  85 year old male choked on pills today. Possible aspiration. EXAM: CHEST - 2 VIEW COMPARISON:  03/24/2018 radiographs and earlier. FINDINGS: Semi upright AP and lateral views. Larger lung volumes compared to most prior exams. Chronic linear atelectasis or scarring at the left lung base with mild elevation of the left hemidiaphragm. Irregular chronic right anterior rib costochondral calcifications. No pneumothorax, pulmonary edema, pleural effusion or acute pulmonary opacity. Tortuous descending thoracic aorta. Other mediastinal contours are within normal limits. No acute osseous abnormality identified. Negative visible bowel gas pattern. IMPRESSION: No evidence of aspiration. Stable chest, including elevated left hemidiaphragm with associated atelectasis or scarring. Electronically Signed   By: Genevie Ann M.D.   On: 04/20/2018 14:18    Procedures Procedures (including critical care time)  Medications Ordered in ED Medications - No data to display   Initial Impression / Assessment and Plan / ED Course  I have reviewed the triage vital signs and the nursing notes.  Pertinent labs & imaging results that were available during my care of the patient were reviewed by me and considered in my medical decision making (see chart for details).  BP 133/72   Pulse 73   Temp 98.1 F (36.7 C) (Oral)   Resp 16   SpO2 97%    Final Clinical  Impressions(s) / ED Diagnoses   Final diagnoses:  Dysphagia, unspecified type    ED Discharge Orders    None     1:27 PM Pt with recurrent aspiration pneumonia who choked on his pills today when he was given it too fast by his nurse.  Sts he concern one of the pill may have been stuck in the back of his throat or go down to his lungs.  He hasn't cough since the incident and is in no acute discomfort at this time.  Will perform swallow screen, and obtain CXR.    3:15 PM No difficulty with swallow screen.  CXR unremarkable, no evidence of aspiration.  Reassurance given.  Doubt pill esophagitis.  No airway compromise.  Stable for discharge. Care discussed with Dr. Marlene Bast, Gertie Fey, PA-C 04/20/18 [place], [place], DO 04/21/18 [phone]

## 2018-04-20 NOTE — ED Notes (Signed)
Wife: Virginia Francisco [phone]

## 2018-04-20 NOTE — Discharge Instructions (Signed)
Please notify staff to give medication with caution as to prevent aspiration pneumonia.  Return if you have any concerns.

## 2018-04-20 NOTE — ED Triage Notes (Signed)
EMS reports from [facility], Pt c/o choking on pill half an hour ago at facility, wants to be evaluated for possible aspiration.  BP 110/62 HR 68 RR 16 Sp02 100 RA

## 2018-04-20 NOTE — ED Notes (Signed)
Bed: RS85 Expected date:  Expected time:  Means of arrival:  Comments: 85 yo eval post choking on pill

## 2018-04-21 ENCOUNTER — Other Ambulatory Visit: Payer: Self-pay | Admitting: *Deleted

## 2018-04-21 NOTE — Patient Outreach (Signed)
[name] [name]) Care Management  04/21/2018  [name] Apr 08, 1933 [id number]   CSW was able to meet with patient and patient's wife, [name] today at [facility] [place] where patient currently resides to receive short-term rehabilitative services, to perform a routine visit.  Patient and Mrs. [name] appeared to be quite frustrated today, reporting that patient had to go to the emergency department at [facility] yesterday because he chocked on a pill that was administered incorrectly.  Mrs. [name] went on to say that patient got really scared and continued to cough profusely, until he finally coughed up a piece of the large pill.  For precautionary measures, and to ensure that patient did not develop aspiration pneumonia, patient and Mrs. [name] insisted that patient be medically cleared before returning to [facility].    Mrs. [name] indicated that she has had numerous conversations with the medical technicians at [facility] because she believes that patient's medications are being administered to him too quickly.  Mrs. [name] reported that patient has already been diagnosed with aspiration pneumonia three times just this past year.  Mrs. [name] admitted that she will be having a conversation with the director of nursing at [facility] to try and prevent this from happening again.  Patient continues to work with speech therapy and has been encouraged to take all medications with applesauce.  Patient has also been instructed to sit up straight while being administered medications and only swallow one pill at a time.  Patient indicated that he will definitely be doing a better job of advocating for himself.  Mrs. [name] has requested to always be present when patient's medications are dispensed.  Patient admits to experiencing a great deal of pain while working with therapies.  Patient reported that the pain  radiates throughout all parts of his body, especially his joints.  Patient indicated that he would really like to completing stop therapies, but realizes that Mrs. [name] is unable to care for him in his current condition.  Patient's goal is to continue to get stronger and to be able to perform activities of daily living independently, so that he can return home to live with wife, with minimal assistance.  Patient agreed that he still has a long way to go.  Patient is anxious to meet with Dr. Meridee Score tomorrow, Tuesday, April 22, 2018 at 3:15PM at [facility] to see how much progress he has made.  Patient's attending nurse at [name] reported that patient's wound is healing nicely.  CSW agreed to follow-up with patient and Mrs. [name] again on Monday, April 28, 2018 at 11:00AM.  [name], BSW, MSW, [place]  Licensed Clinical Social Worker  [place]  [address]ailing [place] [address] [place], [place] [address] Physical Address-[address], [place], [place] [address] Toll Free Main # [phone] Fax # [phone] Cell # [phone]  Office # [phone] [email or web address]

## 2018-04-22 ENCOUNTER — Ambulatory Visit (INDEPENDENT_AMBULATORY_CARE_PROVIDER_SITE_OTHER): Payer: Medicare Other | Admitting: Orthopedic Surgery

## 2018-04-22 DIAGNOSIS — N183 Chronic kidney disease, stage 3 unspecified: Secondary | ICD-10-CM

## 2018-04-22 DIAGNOSIS — Z89511 Acquired absence of right leg below knee: Secondary | ICD-10-CM

## 2018-04-23 ENCOUNTER — Encounter: Payer: Self-pay | Admitting: Cardiology

## 2018-04-23 ENCOUNTER — Other Ambulatory Visit: Payer: Self-pay

## 2018-04-23 ENCOUNTER — Ambulatory Visit: Payer: Self-pay | Admitting: Neurology

## 2018-04-23 ENCOUNTER — Ambulatory Visit (INDEPENDENT_AMBULATORY_CARE_PROVIDER_SITE_OTHER): Payer: Medicare Other | Admitting: Cardiology

## 2018-04-23 DIAGNOSIS — I482 Chronic atrial fibrillation, unspecified: Secondary | ICD-10-CM

## 2018-04-23 DIAGNOSIS — Z89511 Acquired absence of right leg below knee: Secondary | ICD-10-CM | POA: Diagnosis not present

## 2018-04-23 DIAGNOSIS — D638 Anemia in other chronic diseases classified elsewhere: Secondary | ICD-10-CM

## 2018-04-23 DIAGNOSIS — N183 Chronic kidney disease, stage 3 unspecified: Secondary | ICD-10-CM

## 2018-04-23 DIAGNOSIS — Z9861 Coronary angioplasty status: Secondary | ICD-10-CM | POA: Diagnosis not present

## 2018-04-23 DIAGNOSIS — I2510 Atherosclerotic heart disease of native coronary artery without angina pectoris: Secondary | ICD-10-CM | POA: Diagnosis not present

## 2018-04-23 DIAGNOSIS — I5032 Chronic diastolic (congestive) heart failure: Secondary | ICD-10-CM

## 2018-04-23 DIAGNOSIS — I10 Essential (primary) hypertension: Secondary | ICD-10-CM

## 2018-04-23 DIAGNOSIS — K219 Gastro-esophageal reflux disease without esophagitis: Secondary | ICD-10-CM

## 2018-04-23 NOTE — Assessment & Plan Note (Signed)
Moderate LVH on echo Jan 2020-controlled

## 2018-04-23 NOTE — Assessment & Plan Note (Signed)
GFR 45- Jan 2020

## 2018-04-23 NOTE — Assessment & Plan Note (Signed)
Rate controlled. CHADS VASC=5.  No anticoagulation secondary to bleeding risk (Dr Percival Spanish Jan 2020)

## 2018-04-23 NOTE — Patient Instructions (Signed)
Medication Instructions:  Your physician recommends that you continue on your current medications as directed. Please refer to the Current Medication list given to you today. If you need a refill on your cardiac medications before your next appointment, please call your pharmacy.   Lab work: Your physician recommends that you return for lab work in: [facility], BNP, BMET If you have labs (blood work) drawn today and your tests are completely normal, you will receive your results only by: [facility] Kitchen MyChart Message (if you have MyChart) OR . A paper copy in the mail If you have any lab test that is abnormal or we need to change your treatment, we will call you to review the results.  Testing/Procedures: NONE  Follow-Up: At [facility], you and your health needs are our priority.  As part of our continuing mission to provide you with exceptional heart care, we have created designated Provider Care Teams.  These Care Teams include your primary Cardiologist (physician) and Advanced Practice Providers (APPs -  Physician Assistants and Nurse Practitioners) who all work together to provide you with the care you need, when you need it. . Your physician recommends that you schedule a follow-up appointment in: [place].  Any Other Special Instructions Will Be Listed Below (If Applicable).

## 2018-04-23 NOTE — Assessment & Plan Note (Signed)
Hgb 8.19 Mar 2018

## 2018-04-23 NOTE — Assessment & Plan Note (Signed)
Stable

## 2018-04-23 NOTE — Progress Notes (Signed)
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  He had beenon Eliquis w/ CHA2DS2-VASc = 5 (age x 2, CAD, HTN, CHF).  It was decided he was not currently a candidate for chronic oral anticoagulating during his hospitalization Jan 2020 for BKA.     He was discharged to [facility] 04/03/2018.  His wife has always kept meticulous records of his weight and monitored his medications closely.  Since he has been in the nursing home she says that she is they are "almost 100% of the time".  She has been frustrated with some of the care at the nursing home.  1 of the issues is getting a consistent weight.  She used to measure his calf to determine how much diuretics he needed.  After his amputation he has had atrophy in his left calf and that measurement has become unreliable.  His weights at the nursing home when taken in the wheelchair ranged from 163-149.  Generally it looks like his weight now is around 150-154.  Symptomatically he is doing well.  He denies any unusual shortness of breath or orthopnea.  He was seen in the ED 04/20/2018 with dysphagia and choking but was released later that evening.     Current Outpatient Medications  Medication Sig Dispense Refill  . acetaminophen (TYLENOL) 325 MG tablet Take 2 tablets (650 mg total) by mouth every 6 (six) hours as needed for mild pain (pain score 1-3 or temp > 100.5).    Marland Kitchen alendronate (FOSAMAX) 70 MG tablet Take 70 mg by mouth once a week. On Wednesday. Remain upright for 30-60 minutes.    . Amino Acids-Protein Hydrolys (FEEDING SUPPLEMENT, PRO-STAT SUGAR FREE 64,) LIQD Take 30 mLs by mouth 2 (two) times daily.  900 mL 0  . aspirin EC 325 MG EC tablet Take 1 tablet (325 mg total) by mouth daily. 30 tablet 0  . bisacodyl (DULCOLAX) 10 MG suppository Place 1 suppository (10 mg total) rectally daily as needed for moderate constipation. 12 suppository 0  . calcium carbonate (OS-CAL) 600 MG tablet Take 600 mg by mouth daily after breakfast.     . cyanocobalamin 500 MCG tablet Take 500 mcg by mouth at bedtime.    . diclofenac sodium (VOLTAREN) 1 % GEL Apply 2 g topically daily as needed (pain).    Marland Kitchen diltiazem (CARDIZEM CD) 300 MG 24 hr capsule TAKE 1 CAPSULE(300 MG) BY MOUTH EVERY MORNING. DO NOT CRUSH (Patient taking differently: Take 300 mg by mouth daily. ) 90 capsule 3  . divalproex (DEPAKOTE) 250 MG DR tablet Take 250-500 mg by mouth 2 (two) times daily. Takes one tablet in the morning and two tablets in the evening.    . docusate sodium (COLACE) 100 MG capsule Take 1 capsule (100 mg total) by mouth 2 (two) times daily. 10 capsule 0  . doxazosin (CARDURA) 4 MG tablet Take 4 mg by mouth every evening.     . feeding supplement, ENSURE ENLIVE, (ENSURE ENLIVE) LIQD Take 237 mLs by mouth daily. 237 mL 12  . ferrous sulfate 325 (65 FE) MG tablet Take 1 tablet (325 mg total) by mouth 2 (two) times daily with a meal. 60 tablet 0  . fluticasone (FLONASE) 50  MCG/ACT nasal spray Place 2 sprays into both nostrils daily as needed for allergies or rhinitis.    . furosemide (LASIX) 40 MG tablet Take 0.5 tablets (20 mg total) by mouth every morning. Or per cardiologist instructions since patient cannot be weighed 90 tablet 1  . HYDROcodone-acetaminophen (NORCO) 10-325 MG tablet Take 1 tablet by mouth every 4 (four) hours as needed.    . lactobacillus acidophilus & bulgar (LACTINEX) chewable tablet Chew 1 tablet by mouth 3 (three) times daily with meals. 90 tablet 0  . levothyroxine (SYNTHROID, LEVOTHROID) 150 MCG tablet Take 150 mcg by mouth daily before breakfast.    . Lidocaine (ASPERCREME LIDOCAINE) 4 % PTCH Place 1  patch onto the skin 2 (two) times daily.    Marland Kitchen lidocaine (LIDODERM) 5 % Place 1 patch onto the skin daily as needed. Remove & Discard patch within 12 hours or as directed by MD (Patient taking differently: Place 1 patch onto the skin daily as needed (pain). Remove & Discard patch within 12 hours or as directed by MD ) 30 patch 0  . magnesium citrate SOLN Take 296 mLs (1 Bottle total) by mouth once as needed for severe constipation. 195 mL   . Menthol-Methyl Salicylate (MUSCLE RUB EX) Apply 1 application topically daily as needed (shoulder pain).    . methocarbamol (ROBAXIN) 500 MG tablet Take 1 tablet (500 mg total) by mouth every 6 (six) hours as needed for muscle spasms.    . metoprolol tartrate (LOPRESSOR) 25 MG tablet Take 1 tablet (25 mg total) by mouth 2 (two) times daily. Take with or immediately following a meal. 180 tablet 3  . Multiple Vitamins-Minerals (CERTAVITE/ANTIOXIDANTS) TABS Take 1 tablet by mouth every evening.     . nitroGLYCERIN (NITROSTAT) 0.4 MG SL tablet Place 0.4 mg under the tongue every 5 (five) minutes as needed for chest pain. Max of 3 tablets    . omeprazole (PRILOSEC) 20 MG capsule Take 20 mg by mouth daily before breakfast.     . oxycodone (OXY-IR) 5 MG capsule Take 1 capsule (5 mg total) by mouth every 4 (four) hours as needed. 42 capsule 0  . polyethylene glycol (MIRALAX / GLYCOLAX) packet Take 17 g by mouth daily as needed for mild constipation. 14 each 0  . potassium chloride SA (K-DUR,KLOR-CON) 20 MEQ tablet TAKE 1 TABLET BY MOUTH DAILY ALONG WITH LASIX 40MG  OR AS DIRECTED BY CARDIOLOGIST 90 tablet 3  . pravastatin (PRAVACHOL) 40 MG tablet Take 40 mg by mouth every evening.   2  . predniSONE (DELTASONE) 5 MG tablet Take 2 tablets (10 mg total) by mouth daily with breakfast.    . sodium chloride (OCEAN) 0.65 % SOLN nasal spray Place 1 spray into both nostrils as needed for congestion.    Marland Kitchen VITAMIN D, ERGOCALCIFEROL, PO Take 1 capsule by mouth daily.     Marland Kitchen ZETIA 10  MG tablet Take 5 mg by mouth at bedtime. (0.5 tablet)    . polyethylene glycol powder (GLYCOLAX/MIRALAX) powder DIS 17 GRAMS IN 8 OUNCES OF WATER AND DRK PO ONCE D     No current facility-administered medications for this visit.     Allergies  Allergen Reactions  . Eliquis [Apixaban] Other (See Comments)    bleeding  . Demerol [Meperidine] Nausea And Vomiting  . Keppra [Levetiracetam] Other (See Comments)    Causes sleepiness, mental status changes    Past Medical History:  Diagnosis Date  . Abnormality of gait 09/22/2015  . Arthritis   .  Atrial fibrillation ([place])   . CAD (coronary artery disease)    Stent to RCA, Penta stent, 99% reduced to 0% 2002.  . Cancer ([place])    skin CA removed from back  . Chronic insomnia 03/30/2015  . Complication of anesthesia    trouble waking up  . GERD (gastroesophageal reflux disease)   . Hypercholesteremia   . Hypertension   . Hypothyroidism   . Memory difficulty 09/22/2015  . Osteoarthritis   . Seizures ([place])   . Sepsis ([place]) 05/2017  . Transient alteration of awareness 03/30/2015  . Vertigo    hx of    Social History   Socioeconomic History  . Marital status: Married    Spouse name: [name]  . Number of children: 3  . Years of education: 102  . Highest education level: Not on file  Occupational History  . Occupation: retired Software engineer  Social Needs  . Financial resource strain: Not on file  . Food insecurity:    Worry: Not on file    Inability: Not on file  . Transportation needs:    Medical: Not on file    Non-medical: Not on file  Tobacco Use  . Smoking status: Former Research scientist (life sciences)  . Smokeless tobacco: Never Used  Substance and Sexual Activity  . Alcohol use: Yes    Comment: rare  . Drug use: No  . Sexual activity: Not Currently  Lifestyle  . Physical activity:    Days per week: Not on file    Minutes per session: Not on file  . Stress: Not on file  Relationships  . Social connections:    Talks on phone: Not on file      Gets together: Not on file    Attends religious service: Not on file    Active member of club or organization: Not on file    Attends meetings of clubs or organizations: Not on file    Relationship status: Not on file  . Intimate partner violence:    Fear of current or ex partner: Not on file    Emotionally abused: Not on file    Physically abused: Not on file    Forced sexual activity: Not on file  Other Topics Concern  . Not on file  Social History Narrative   Lives at home w/ his wife [name]   Patient drinks 4-5 cups of coffee daily.   Patient is right handed.      Family History  Problem Relation Age of Onset  . Hypertension Mother   . Cancer Mother   . Kidney failure Father   . Heart disease Father      Review of Systems: General: negative for chills, fever, night sweats or weight changes.  Cardiovascular: negative for chest pain, dyspnea on exertion, edema, orthopnea, palpitations, paroxysmal nocturnal dyspnea or shortness of breath Dermatological: negative for rash Respiratory: negative for cough or wheezing Urologic: negative for hematuria Abdominal: negative for nausea, vomiting, diarrhea, bright red blood per rectum, melena, or hematemesis Neurologic: negative for visual changes, syncope, or dizziness All other systems reviewed and are otherwise negative except as noted above.    Blood pressure 110/72, pulse 77, SpO2 99 %.  General appearance: alert, cooperative, appears stated age, no distress and in wheel chair Neck: no JVD Lungs: clear to auscultation bilaterally Heart: irregularly irregular rhythm Extremities: Rt BKA, no LLE edema Skin: pale, warm, and dry Neurologic: Grossly normal   ASSESSMENT AND PLAN:   Chronic atrial fibrillation Rate controlled. CHADS VASC=5.  No anticoagulation secondary to bleeding risk (Dr Percival Spanish Jan 2020)  Chronic diastolic (congestive) heart failure (HCC) Stable  CAD S/P percutaneous coronary  angioplasty Remote PCI in '02. Normal LVF by echo 03/25/2018  CKD (chronic kidney disease) stage 3, GFR 30-59 ml/min (HCC) GFR 45- Jan 2020  Essential hypertension Moderate LVH on echo Jan 2020-controlled  Anemia of chronic disease Hgb 8.19 Mar 2018  GERD (gastroesophageal reflux disease) Seen in ED 04/20/2018 with dysphagia   PLAN  His wife is very concerned about how to monitor his volume status.  I suggested we get labs today and establish a new baseline as he appears to be volume stable.  I suggested she would have to further assess him for new SOB if his weight was suddenly 5-10 lbs up.   Kerin Ransom PA-C 04/23/2018 1:25 PM

## 2018-04-23 NOTE — Assessment & Plan Note (Signed)
Remote PCI in '02. Normal LVF by echo 03/25/2018

## 2018-04-23 NOTE — Assessment & Plan Note (Signed)
Seen in ED 04/20/2018 with dysphagia

## 2018-04-24 LAB — CBC
Hematocrit: 30.4 % — ABNORMAL LOW (ref 37.5–51.0)
Hemoglobin: 10 g/dL — ABNORMAL LOW (ref 13.0–17.7)
MCH: 31.8 pg (ref 26.6–33.0)
MCHC: 32.9 g/dL (ref 31.5–35.7)
MCV: 97 fL (ref 79–97)
Platelets: 166 10*3/uL (ref 150–450)
RBC: 3.14 x10E6/uL — ABNORMAL LOW (ref 4.14–5.80)
RDW: 13.4 % (ref 11.6–15.4)
WBC: 8.8 10*3/uL (ref 3.4–10.8)

## 2018-04-24 LAB — BASIC METABOLIC PANEL
BUN/Creatinine Ratio: 25 — ABNORMAL HIGH (ref 10–24)
BUN: 37 mg/dL — ABNORMAL HIGH (ref 8–27)
CO2: 22 mmol/L (ref 20–29)
Calcium: 8.7 mg/dL (ref 8.6–10.2)
Chloride: 103 mmol/L (ref 96–106)
Creatinine, Ser: 1.48 mg/dL — ABNORMAL HIGH (ref 0.76–1.27)
GFR calc Af Amer: 50 mL/min/{1.73_m2} — ABNORMAL LOW (ref >59)
GFR calc non Af Amer: 43 mL/min/{1.73_m2} — ABNORMAL LOW (ref >59)
Glucose: 95 mg/dL (ref 65–99)
Potassium: 4.9 mmol/L (ref 3.5–5.2)
Sodium: 141 mmol/L (ref 134–144)

## 2018-04-24 LAB — PRO B NATRIURETIC PEPTIDE: NT-Pro BNP: 3612 pg/mL — ABNORMAL HIGH (ref 0–486)

## 2018-04-24 NOTE — Progress Notes (Signed)
Monitoring his fluid status has always been extremely challenging.  In the past he was unable to stand up to be weighed. His NT-proBNP levels appear to be very steady.  I would assume a baseline BNP of 300-350, baseline proBNP of 3000-4000. I would agree with you that he is probably euvolemic at this time, but do not have any other bright ideas about how we could monitoring except making sure that we are always weighing him with the same wheelchair, same clothes, same technique.   I wonder whether the variation in weights might be because some of the health personnel are subtracting the weight of the scale before documenting a net weight, whereas others may just be recording a gross weight, wheelchair and all. MCr

## 2018-04-25 ENCOUNTER — Encounter (INDEPENDENT_AMBULATORY_CARE_PROVIDER_SITE_OTHER): Payer: Self-pay | Admitting: Orthopedic Surgery

## 2018-04-25 NOTE — Progress Notes (Signed)
Office Visit Note   Patient: [name]           Date of Birth: 06-21-1933           MRN: [id number] Visit Date: 04/22/2018              Requested by: [name], [place], [place] [address] PCP: [name], MD  Chief Complaint  Patient presents with  . Right Leg - Follow-up      HPI: Patient is an 85 year old gentleman who presents 3 weeks status post right transtibial amputation.  He is currently using a stump shrinker and a limb protector ambulating in a wheelchair.  Assessment & Plan: Visit Diagnoses:  1. Acquired absence of right lower extremity below knee ([place])   2. CKD (chronic kidney disease) stage 3, GFR 30-59 ml/min (HCC)     Plan: The staples are harvested continue with the stump shrinker and limb protector follow-up with biotech.  Follow-Up Instructions: Return in about 4 weeks (around 05/20/2018).   Ortho Exam  Patient is alert, oriented, no adenopathy, well-dressed, normal affect, normal respiratory effort. Examination the incision is well-healed there is no redness no cellulitis no signs of infection.  Imaging: No results found. No images are attached to the encounter.  Labs: Lab Results  Component Value Date   HGBA1C 5.7 (H) 03/26/2018   HGBA1C 5.0 07/12/2017   HGBA1C 5.8 (H) 07/06/2015   ESRSEDRATE 24 (H) 03/24/2018   ESRSEDRATE 15 04/18/2017   ESRSEDRATE 5 09/17/2007   CRP 1.8 (H) 03/24/2018   CRP <0.8 04/24/2017   LABURIC 6.0 03/09/2016   REPTSTATUS 03/27/2018 FINAL 03/26/2018   GRAMSTAIN  07/15/2017    WBC PRESENT,BOTH PMN AND MONONUCLEAR NO ORGANISMS SEEN Performed at [facility], [place] [address], [place], [place] [address]    CULT  03/26/2018    NO GROWTH Performed at [facility] [address] [place], [place] [address]    LABORGA ESCHERICHIA COLI (A) 09/14/2017     Lab Results  Component Value Date   ALBUMIN 2.5 (L) 03/31/2018   ALBUMIN 2.7 (L) 03/30/2018   ALBUMIN 2.8 (L)  03/29/2018   PREALBUMIN 36 (H) 11/22/2017   PREALBUMIN 28.8 04/24/2017   LABURIC 6.0 03/09/2016    There is no height or weight on file to calculate BMI.  Orders:  No orders of the defined types were placed in this encounter.  No orders of the defined types were placed in this encounter.    Procedures: No procedures performed  Clinical Data: No additional findings.  ROS:  All other systems negative, except as noted in the HPI. Review of Systems  Objective: Vital Signs: There were no vitals taken for this visit.  Specialty Comments:  No specialty comments available.  PMFS History: Patient Active Problem List   Diagnosis Date Noted  . AKI (acute kidney injury) ([place])   . S/P BKA (below knee amputation) unilateral, right ([place])   . Unilateral complete BKA, right, initial encounter ([place])   . Tachypnea   . Post-operative pain   . Subacute osteomyelitis, right ankle and foot ([place])   . Paralysis of right lower extremity ([place])   . TIA (transient ischemic attack) 03/24/2018  . Encephalopathy 03/24/2018  . Cellulitis 03/15/2018  . Hypernatremia 03/15/2018  . Persistent atrial fibrillation 11/10/2017  . Severe muscle deconditioning 11/10/2017  . Hemorrhage 10/22/2017  . CAD S/P percutaneous coronary angioplasty 10/22/2017  . Acute hypoxemic respiratory failure ([place]) 07/17/2017  .  Aspiration syndrome, subsequent encounter 07/11/2017  . Aspiration pneumonia ([place]) 07/01/2017  . Acute encephalopathy 07/01/2017  . Thrombocytopenia ([place]) 07/01/2017  . Chronic diastolic (congestive) heart failure ([place]) 07/01/2017  . Anemia of chronic disease 07/01/2017  . Troponin level elevated 07/01/2017  . Chronic atrial fibrillation 07/01/2017  . Goals of care, counseling/discussion   . Palliative care by specialist   . Acute metabolic encephalopathy 36/14/4315  . CKD (chronic kidney disease) stage 3, GFR 30-59 ml/min (HCC)   . CAP (community acquired pneumonia) 04/17/2017  . Hallux  rigidus, right foot 03/10/2016  . Lumbosacral spondylosis without myelopathy 10/29/2015  . Memory difficulty 09/22/2015  . Abnormality of gait 09/22/2015  . Hyperglycemia 07/06/2015  . Chronic pain 07/06/2015  . Chronic insomnia 03/30/2015  . Transient alteration of awareness 03/30/2015  . Abnormal liver function   . Altered mental status 01/31/2015  . Essential hypertension 01/31/2015  . Constipation 01/31/2015  . Hypothyroidism 01/31/2015  . Seizure disorder ([place]) 01/31/2015  . Bladder outlet obstruction 01/31/2015  . GERD (gastroesophageal reflux disease) 01/31/2015  . Chronic back pain 01/31/2015  . Acute kidney injury ([place]) 01/31/2015   Past Medical History:  Diagnosis Date  . Abnormality of gait 09/22/2015  . Arthritis   . Atrial fibrillation ([place])   . CAD (coronary artery disease)    Stent to RCA, Penta stent, 99% reduced to 0% 2002.  . Cancer ([place])    skin CA removed from back  . Chronic insomnia 03/30/2015  . Complication of anesthesia    trouble waking up  . GERD (gastroesophageal reflux disease)   . Hypercholesteremia   . Hypertension   . Hypothyroidism   . Memory difficulty 09/22/2015  . Osteoarthritis   . Seizures ([place])   . Sepsis ([place]) 05/2017  . Transient alteration of awareness 03/30/2015  . Vertigo    hx of    Family History  Problem Relation Age of Onset  . Hypertension Mother   . Cancer Mother   . Kidney failure Father   . Heart disease Father     Past Surgical History:  Procedure Laterality Date  . AMPUTATION Right 03/28/2018   Procedure: AMPUTATION BELOW KNEE;  Surgeon: Newt Minion, MD;  Location: [facility];  Service: Orthopedics;  Laterality: Right;  . BACK SURGERY    . EYE SURGERY     Bilateral Cataract surgery   . HERNIA REPAIR    . I&D EXTREMITY Right 05/10/2015   Procedure: IRRIGATION AND DEBRIDEMENT EXTREMITY;  Surgeon: Leanora Cover, MD;  Location: [facility];  Service: Orthopedics;  Laterality: Right;  . KNEE ARTHROPLASTY      right knee X 2; left knee once  . LAMINECTOMY     X 6  . POSTERIOR CERVICAL FUSION/FORAMINOTOMY  01/28/2012   Procedure: POSTERIOR CERVICAL FUSION/FORAMINOTOMY LEVEL 3;  Surgeon: [name], MD;  Location: [facility];  Service: Neurosurgery;  Laterality: Left;  Posterior Cervical Five-Thoracic One Fusion, Arthrodesis with LEFT Cervical Seven-thoracic One Laminectomy, Foraminotomy and Resection of Synovial Cyst  . POSTERIOR CERVICAL FUSION/FORAMINOTOMY N/A 01/29/2013   Procedure: POSTERIOR CERVICAL FUSION/FORAMINOTOMY LEVEL 1 and C2-5 Posteriolateral Arthrodesis;  Surgeon: [name], MD;  Location: [facility];  Service: Neurosurgery;  Laterality: N/A;  C2-C3 Laminectomy C2-C3 posterior cervical arthrodesis  . TONSILLECTOMY     Social History   Occupational History  . Occupation: retired Software engineer  Tobacco Use  . Smoking status: Former Research scientist (life sciences)  . Smokeless tobacco: Never Used  Substance and Sexual Activity  .  Alcohol use: Yes    Comment: rare  . Drug use: No  . Sexual activity: Not Currently

## 2018-04-28 ENCOUNTER — Other Ambulatory Visit: Payer: Self-pay | Admitting: *Deleted

## 2018-04-28 ENCOUNTER — Telehealth: Payer: Self-pay | Admitting: Cardiology

## 2018-04-28 NOTE — Telephone Encounter (Signed)
Attempted to contact Wife. Unable to leave message, as mailbox is full.

## 2018-04-28 NOTE — Patient Outreach (Signed)
[facility]) Care Management  04/28/2018  [name] 05/20/1933 [id number]   CSW was able to make contact with patient today by phone to follow-up regarding social work services and resources, as well as to ensure that patient is still residing at [facility] [place] where patient is receiving short-term rehabilitative services.  CSW was unable to obtain a great deal of information from patient and patient's wife, [name] was unavailable at the time of CSW's call.  CSW was able to leave a HIPAA compliant message for [name] and is currently awaiting a return call.    Patient reported that he is now using a stump shrinker and a limb protector to assist with ambulation in a wheelchair.  Patient indicated that he continues to work with physical therapy and occupational therapy, but that progress has been very slow.  Patient stated that the staff at [facility] have had a hard time trying to monitor his fluid status, which is concerning to [name].  Patient went on to say that [name] is also somewhat frustrated with the way patient's medications continue to be administered; therefore, she tries to always be present to monitor medication administration.  Patient denied having a tentative discharge date scheduled at this time, but reported that he has been residing at [facility] since Wednesday, April 02, 2018, for a total of 4 weeks on Wednesday, April 30, 2018.  CSW has agreed to follow-up with patient again on Monday, May 05, 2018 at 9:00AM at [facility] to assess and assist with discharge planning needs and services.  Patient voiced understanding and was agreeable to this plan, agreeing to involve [name] in this visit.  [name], BSW, MSW, LCSW  Licensed Education officer, environmental Health System  Mailing [place] [address], [place], [place]  [address] Physical Address-[address], [place],  [address] Toll Free Main # [phone] Fax # [phone] Cell # [phone]  Office # [phone] [name]

## 2018-04-28 NOTE — Telephone Encounter (Signed)
° ° °  Patient's spouse calling to request order to discontinue daily weights at [facility]

## 2018-04-29 NOTE — Telephone Encounter (Signed)
Spoke with pt wife, she reports luke told them that because of the discrepancies in his weight that it would be fine not to weigh daily. They are needing an order faxed to [phone]. Will forward to [facility] for okay not to weigh.

## 2018-04-29 NOTE — Telephone Encounter (Signed)
[name] notified of weighing instructions per her request. She thanks me for calling and hopes the facility will weigh the patient correctly.   Phone note faxed to facility.

## 2018-04-29 NOTE — Telephone Encounter (Signed)
Please ask SNF to weigh without prosthesis  Mclean Moya PA-C 04/29/2018 2:02 PM

## 2018-04-29 NOTE — Telephone Encounter (Signed)
Ok to weigh patient 3 times a week  Kerin Ransom PA-C 04/29/2018 12:43 PM

## 2018-04-29 NOTE — Telephone Encounter (Signed)
Spoke with patients wife, [name], notified her that we [facility] states the patient can weigh three times a week. [name] states they do not weigh patient the same meaning some days they weight the prostethic leg and some days they don't. She wants [place] to send something over stating to weigh the patient the same each time a weight is checked.    I will fax over request to the facility.

## 2018-05-05 ENCOUNTER — Other Ambulatory Visit: Payer: Self-pay | Admitting: *Deleted

## 2018-05-05 NOTE — Patient Outreach (Signed)
[facility]) Care Management  05/05/2018  [name] [birth year] [id number]   CSW was able to meet with patient and patient's wife, [name] today at [facility] [place] where patient currently resides to receive short-term rehabilitative services.  [name] was in the process of leaving [facility] at the time of CSW's arrival, admitting that she spends at least 75-80% of her time at the facility making sure that patient is being well-cared for.  [name] regretted to inform CSW that she believes that patient will need to reside at the facility for an additional month, as progress with therapies has been extremely slow.  Patient attributes this to him feeling weak and fatigued as a result of poor food intake.  Patient admitted that he does not have much of an appetite, having to force himself to try and eat.  [name] reported that she is even bringing food into the facility for patient to eat, but it still does not appear to be appealing to patient.  CSW provided counseling and supportive services to patient and [name], as both voiced concerns about patient's future and all the uncertainties.  CSW agreed to follow-up with patient and [name] again on Monday, May 12, 2018 around 10:00AM, by phone, to assess and assist with discharge planning needs and services.  [name], BSW, MSW, LCSW  Licensed Education officer, environmental Health System  Mailing [place] [address] [place], [place] [address] Physical Address-[address], [place], [place] [address] Toll Free Main # [phone] Fax # [phone] Cell # [phone]  Office # [phone] [email or web address]

## 2018-05-07 ENCOUNTER — Emergency Department (HOSPITAL_COMMUNITY)
Admission: EM | Admit: 2018-05-07 | Discharge: 2018-05-07 | Disposition: A | Discharge status: Home / Self Care | Payer: Medicare Other | Source: Home / Self Care | Attending: Emergency Medicine | Admitting: Emergency Medicine

## 2018-05-07 ENCOUNTER — Emergency Department (HOSPITAL_COMMUNITY): Payer: Medicare Other

## 2018-05-07 ENCOUNTER — Other Ambulatory Visit: Payer: Self-pay

## 2018-05-07 DIAGNOSIS — T8743 Infection of amputation stump, right lower extremity: Secondary | ICD-10-CM | POA: Diagnosis not present

## 2018-05-07 DIAGNOSIS — R52 Pain, unspecified: Secondary | ICD-10-CM | POA: Diagnosis not present

## 2018-05-07 DIAGNOSIS — Z8673 Personal history of transient ischemic attack (TIA), and cerebral infarction without residual deficits: Secondary | ICD-10-CM | POA: Insufficient documentation

## 2018-05-07 DIAGNOSIS — Z85828 Personal history of other malignant neoplasm of skin: Secondary | ICD-10-CM

## 2018-05-07 DIAGNOSIS — N183 Chronic kidney disease, stage 3 (moderate): Secondary | ICD-10-CM | POA: Insufficient documentation

## 2018-05-07 DIAGNOSIS — Z87891 Personal history of nicotine dependence: Secondary | ICD-10-CM

## 2018-05-07 DIAGNOSIS — I130 Hypertensive heart and chronic kidney disease with heart failure and stage 1 through stage 4 chronic kidney disease, or unspecified chronic kidney disease: Secondary | ICD-10-CM | POA: Insufficient documentation

## 2018-05-07 DIAGNOSIS — I5032 Chronic diastolic (congestive) heart failure: Secondary | ICD-10-CM

## 2018-05-07 DIAGNOSIS — Z7982 Long term (current) use of aspirin: Secondary | ICD-10-CM

## 2018-05-07 DIAGNOSIS — Z79899 Other long term (current) drug therapy: Secondary | ICD-10-CM

## 2018-05-07 DIAGNOSIS — Z96653 Presence of artificial knee joint, bilateral: Secondary | ICD-10-CM | POA: Insufficient documentation

## 2018-05-07 DIAGNOSIS — I2510 Atherosclerotic heart disease of native coronary artery without angina pectoris: Secondary | ICD-10-CM | POA: Insufficient documentation

## 2018-05-07 DIAGNOSIS — E039 Hypothyroidism, unspecified: Secondary | ICD-10-CM

## 2018-05-07 DIAGNOSIS — J069 Acute upper respiratory infection, unspecified: Secondary | ICD-10-CM

## 2018-05-07 DIAGNOSIS — B9789 Other viral agents as the cause of diseases classified elsewhere: Principal | ICD-10-CM

## 2018-05-07 LAB — URINALYSIS, ROUTINE W REFLEX MICROSCOPIC
Bilirubin Urine: NEGATIVE
Glucose, UA: NEGATIVE mg/dL
Hgb urine dipstick: NEGATIVE
Ketones, ur: NEGATIVE mg/dL
Nitrite: NEGATIVE
Protein, ur: NEGATIVE mg/dL
Specific Gravity, Urine: 1.01 (ref 1.005–1.030)
pH: 6 (ref 5.0–8.0)

## 2018-05-07 LAB — CBC WITH DIFFERENTIAL/PLATELET
Abs Immature Granulocytes: 0.14 10*3/uL — ABNORMAL HIGH (ref 0.00–0.07)
Basophils Absolute: 0 10*3/uL (ref 0.0–0.1)
Basophils Relative: 1 %
Eosinophils Absolute: 0.1 10*3/uL (ref 0.0–0.5)
Eosinophils Relative: 1 %
HCT: 28.4 % — ABNORMAL LOW (ref 39.0–52.0)
Hemoglobin: 8.6 g/dL — ABNORMAL LOW (ref 13.0–17.0)
Immature Granulocytes: 2 %
Lymphocytes Relative: 10 %
Lymphs Abs: 0.7 10*3/uL (ref 0.7–4.0)
MCH: 30.6 pg (ref 26.0–34.0)
MCHC: 30.3 g/dL (ref 30.0–36.0)
MCV: 101.1 fL — ABNORMAL HIGH (ref 80.0–100.0)
Monocytes Absolute: 1.5 10*3/uL — ABNORMAL HIGH (ref 0.1–1.0)
Monocytes Relative: 20 %
Neutro Abs: 4.9 10*3/uL (ref 1.7–7.7)
Neutrophils Relative %: 66 %
Platelets: 206 10*3/uL (ref 150–400)
RBC: 2.81 MIL/uL — ABNORMAL LOW (ref 4.22–5.81)
RDW: 14.4 % (ref 11.5–15.5)
WBC: 7.4 10*3/uL (ref 4.0–10.5)
nRBC: 0 % (ref 0.0–0.2)

## 2018-05-07 LAB — BASIC METABOLIC PANEL
Anion gap: 9 (ref 5–15)
BUN: 26 mg/dL — ABNORMAL HIGH (ref 8–23)
CO2: 23 mmol/L (ref 22–32)
Calcium: 8.2 mg/dL — ABNORMAL LOW (ref 8.9–10.3)
Chloride: 106 mmol/L (ref 98–111)
Creatinine, Ser: 1.33 mg/dL — ABNORMAL HIGH (ref 0.61–1.24)
GFR calc Af Amer: 57 mL/min — ABNORMAL LOW (ref >60)
GFR calc non Af Amer: 49 mL/min — ABNORMAL LOW (ref >60)
Glucose, Bld: 100 mg/dL — ABNORMAL HIGH (ref 70–99)
Potassium: 4.2 mmol/L (ref 3.5–5.1)
Sodium: 138 mmol/L (ref 135–145)

## 2018-05-07 LAB — INFLUENZA PANEL BY PCR (TYPE A & B)
Influenza A By PCR: NEGATIVE
Influenza B By PCR: NEGATIVE

## 2018-05-07 LAB — URINALYSIS, MICROSCOPIC (REFLEX)

## 2018-05-07 LAB — LACTIC ACID, PLASMA: Lactic Acid, Venous: 1.4 mmol/L (ref 0.5–1.9)

## 2018-05-07 MED ORDER — SODIUM CHLORIDE 0.9 % IV BOLUS: 1000.0000 mL | Freq: Once | INTRAVENOUS | Status: DC

## 2018-05-07 MED ORDER — HYDROCODONE-ACETAMINOPHEN 5-325 MG PO TABS
1.0000 | ORAL_TABLET | Freq: Once | ORAL | Status: AC
  Administered 2018-05-07: 1 via ORAL
  Filled 2018-05-07: qty 1

## 2018-05-07 MED ORDER — SODIUM CHLORIDE 0.9 % IV BOLUS
250.0000 mL | Freq: Once | INTRAVENOUS | Status: AC
  Administered 2018-05-07: 250 mL via INTRAVENOUS

## 2018-05-07 NOTE — ED Provider Notes (Signed)
[facility] EMERGENCY DEPARTMENT Provider Note   CSN: [id number] Arrival date & time: 05/07/18  1052    History   Chief Complaint Chief Complaint  Patient presents with  . Fatigue    HPI [name] is a 85 y.o. male.     HPI Patient presents to the emergency department with generalized fatigue and lethargy over the last 2 days.  The patient had a below-knee amputation in January and has had a nursing facility.  Patient has been having some cough with slight elevation in his temperature.  Patient states he is only had a slight cough but no dysuria.  Patient states that nothing seems to make the condition better or worse.  The patient denies chest pain, shortness of breath, headache,blurred vision, neck pain, fever, cough, weakness, numbness, dizziness, anorexia, edema, abdominal pain, nausea, vomiting, diarrhea, rash, back pain, dysuria, hematemesis, bloody stool, near syncope, or syncope. Past Medical History:  Diagnosis Date  . Abnormality of gait 09/22/2015  . Arthritis   . Atrial fibrillation ([place])   . CAD (coronary artery disease)    Stent to RCA, Penta stent, 99% reduced to 0% 2002.  . Cancer ([place])    skin CA removed from back  . Chronic insomnia 03/30/2015  . Complication of anesthesia    trouble waking up  . GERD (gastroesophageal reflux disease)   . Hypercholesteremia   . Hypertension   . Hypothyroidism   . Memory difficulty 09/22/2015  . Osteoarthritis   . Seizures ([place])   . Sepsis ([place]) 05/2017  . Transient alteration of awareness 03/30/2015  . Vertigo    hx of    Patient Active Problem List   Diagnosis Date Noted  . AKI (acute kidney injury) ([place])   . S/P BKA (below knee amputation) unilateral, right ([place])   . Unilateral complete BKA, right, initial encounter ([place])   . Tachypnea   . Post-operative pain   . Subacute osteomyelitis, right ankle and foot ([place])   . 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 TIA (transient ischemic  attack) 03/24/2018  . Encephalopathy 03/24/2018  . Cellulitis 03/15/2018  . Hypernatremia 03/15/2018  . Persistent atrial fibrillation 11/10/2017  . Severe muscle deconditioning 11/10/2017  . Hemorrhage 10/22/2017  . CAD S/P percutaneous coronary angioplasty 10/22/2017  . Acute hypoxemic respiratory failure ([place]) 07/17/2017  . Aspiration syndrome, subsequent encounter 07/11/2017  . Aspiration pneumonia ([place]) 07/01/2017  . Acute encephalopathy 07/01/2017  . Thrombocytopenia ([place]) 07/01/2017  . Chronic diastolic (congestive) heart failure ([place]) 07/01/2017  . Anemia of chronic disease 07/01/2017  . Troponin level elevated 07/01/2017  . Chronic atrial fibrillation 07/01/2017  . Goals of care, counseling/discussion   . Palliative care by specialist   . Acute metabolic encephalopathy 54/00/8676  . CKD (chronic kidney disease) stage 3, GFR 30-59 ml/min (HCC)   . CAP (community acquired pneumonia) 04/17/2017  . Hallux rigidus, right foot 03/10/2016  . Lumbosacral spondylosis without myelopathy 10/29/2015  . Memory difficulty 09/22/2015  . Abnormality of gait 09/22/2015  . Hyperglycemia 07/06/2015  . Chronic pain 07/06/2015  . Chronic insomnia 03/30/2015  . Transient alteration of awareness 03/30/2015  . Abnormal liver function   . Altered mental status 01/31/2015  . Essential hypertension 01/31/2015  . Constipation 01/31/2015  . Hypothyroidism 01/31/2015  . Seizure disorder ([place]) 01/31/2015  . Bladder outlet obstruction 01/31/2015  . GERD (gastroesophageal reflux disease) 01/31/2015  . Chronic back pain 01/31/2015  . Acute kidney injury ([place]) 01/31/2015    Past  Surgical History:  Procedure Laterality Date  . AMPUTATION Right 03/28/2018   Procedure: AMPUTATION BELOW KNEE;  Surgeon: Newt Minion, MD;  Location: [facility];  Service: Orthopedics;  Laterality: Right;  . BACK SURGERY    . EYE SURGERY     Bilateral Cataract surgery   . HERNIA REPAIR    . I&D EXTREMITY Right 05/10/2015     Procedure: IRRIGATION AND DEBRIDEMENT EXTREMITY;  Surgeon: Leanora Cover, MD;  Location: [facility];  Service: Orthopedics;  Laterality: Right;  . KNEE ARTHROPLASTY     right knee X 2; left knee once  . LAMINECTOMY     X 6  . POSTERIOR CERVICAL FUSION/FORAMINOTOMY  01/28/2012   Procedure: POSTERIOR CERVICAL FUSION/FORAMINOTOMY LEVEL 3;  Surgeon: [name], MD;  Location: [facility];  Service: Neurosurgery;  Laterality: Left;  Posterior Cervical Five-Thoracic One Fusion, Arthrodesis with LEFT Cervical Seven-thoracic One Laminectomy, Foraminotomy and Resection of Synovial Cyst  . POSTERIOR CERVICAL FUSION/FORAMINOTOMY N/A 01/29/2013   Procedure: POSTERIOR CERVICAL FUSION/FORAMINOTOMY LEVEL 1 and C2-5 Posteriolateral Arthrodesis;  Surgeon: [name], MD;  Location: [facility];  Service: Neurosurgery;  Laterality: N/A;  C2-C3 Laminectomy C2-C3 posterior cervical arthrodesis  . TONSILLECTOMY          Home Medications    Prior to Admission medications   Medication Sig Start Date End Date Taking? Authorizing Provider  alendronate (FOSAMAX) 70 MG tablet Take 70 mg by mouth once a week. On Wednesday. Remain upright for 30-60 minutes. 01/06/12  Yes [provider]  Amino Acids-Protein Hydrolys (FEEDING SUPPLEMENT, PRO-STAT SUGAR FREE 64,) LIQD Take 30 mLs by mouth 2 (two) times daily. 07/05/17  Yes Lavina Hamman, MD  aspirin EC 325 MG EC tablet Take 1 tablet (325 mg total) by mouth daily. 04/03/18  Yes Debbe Odea, MD  bisacodyl (DULCOLAX) 10 MG suppository Place 1 suppository (10 mg total) rectally daily as needed for moderate constipation. 04/02/18  Yes Debbe Odea, MD  calcium-vitamin D (OSCAL WITH D) 500-200 MG-UNIT tablet Take 1 tablet by mouth daily with breakfast. 500 mg (1250 mg)-200   Yes [provider]  cholecalciferol (VITAMIN D) 25 MCG (1000 UT) tablet Take 1,000 Units by mouth daily.   Yes [provider]  cyanocobalamin  500 MCG tablet Take 500 mcg by mouth at bedtime.   Yes [provider]  diclofenac sodium (VOLTAREN) 1 % GEL Apply 2 g topically daily as needed (pain).   Yes [provider]  diltiazem (CARDIZEM CD) 300 MG 24 hr capsule TAKE 1 CAPSULE(300 MG) BY MOUTH EVERY MORNING. DO NOT CRUSH Patient taking differently: Take 300 mg by mouth daily. For HTN hold med for SBP less than 110 and DBP less than 60 12/03/17  Yes Croitoru, Mihai, MD  divalproex (DEPAKOTE) 250 MG DR tablet Take 250-500 mg by mouth 2 (two) times daily. Takes 250 mg tablet in the morning and 500 mg  in the evening.   Yes [provider]  docusate sodium (COLACE) 100 MG capsule Take 1 capsule (100 mg total) by mouth 2 (two) times daily. 04/02/18  Yes Debbe Odea, MD  doxazosin (CARDURA) 4 MG tablet Take 2 mg by mouth every evening. Hold med for SBP less than 110  And DBP less than 60   Yes [provider]  ferrous sulfate 325 (65 FE) MG tablet Take 1 tablet (325 mg total) by mouth 2 (two) times daily with a meal. 06/28/17  Yes Skeet Latch, MD  fluticasone (FLONASE) 50 MCG/ACT nasal spray Place 2 sprays into both nostrils daily as needed for allergies or rhinitis.   Yes [provider]  furosemide (LASIX) 40 MG tablet Take 0.5 tablets (20 mg total) by mouth every morning. Or per cardiologist instructions since patient cannot be weighed Patient taking differently: Take 20 mg by mouth every morning.  04/02/18  Yes Rizwan, Eunice Blase, MD  HYDROcodone-acetaminophen (NORCO) 10-325 MG tablet Take 1 tablet by mouth every 4 (four) hours as needed.   Yes [provider]  ipratropium-albuterol (DUONEB) 0.5-2.5 (3) MG/3ML SOLN Take 3 mLs by nebulization every 4 (four) hours as needed.   Yes [provider]  lactobacillus acidophilus & bulgar (LACTINEX) chewable tablet Chew 1 tablet by mouth 3 (three) times daily with meals. 03/19/18  Yes Roxan Hockey, MD  levothyroxine (SYNTHROID, LEVOTHROID)  150 MCG tablet Take 150 mcg by mouth daily before breakfast. 12/27/11  Yes [provider]  Lidocaine (ASPERCREME LIDOCAINE) 4 % PTCH Apply 1 patch topically 2 (two) times daily.    Yes [provider]  magnesium citrate SOLN Take 296 mLs (1 Bottle total) by mouth once as needed for severe constipation. 04/02/18  Yes Debbe Odea, MD  Menthol-Methyl Salicylate (MUSCLE RUB EX) Apply 1 application topically daily as needed (shoulder pain).   Yes [provider]  methocarbamol (ROBAXIN) 500 MG tablet Take 1 tablet (500 mg total) by mouth every 6 (six) hours as needed for muscle spasms. 04/02/18  Yes Debbe Odea, MD  metoprolol tartrate (LOPRESSOR) 25 MG tablet Take 1 tablet (25 mg total) by mouth 2 (two) times daily. Take with or immediately following a meal. 11/06/17  Yes Croitoru, Mihai, MD  Multiple Vitamins-Minerals (CERTAVITE/ANTIOXIDANTS) TABS Take 1 tablet by mouth every evening. 18-400 mg   Yes [provider]  nitroGLYCERIN (NITROSTAT) 0.4 MG SL tablet Place 0.4 mg under the tongue every 5 (five) minutes as needed for chest pain. Max of 3 tablets   Yes [provider]  omeprazole (PRILOSEC) 20 MG capsule Take 20 mg by mouth daily before breakfast.  12/23/11  Yes [provider]  oseltamivir (TAMIFLU) 30 MG capsule Take 30 mg by mouth daily.   Yes [provider]  polyethylene glycol powder (GLYCOLAX/MIRALAX) powder Take 1 Container by mouth as needed for moderate constipation.  03/03/18  Yes [provider]  potassium chloride SA (K-DUR,KLOR-CON) 20 MEQ tablet TAKE 1 TABLET BY MOUTH DAILY ALONG WITH LASIX 40MG  OR AS DIRECTED BY CARDIOLOGIST Patient taking differently: Take 20 mEq by mouth daily.  03/11/18  Yes Croitoru, Mihai, MD  pravastatin (PRAVACHOL) 40 MG tablet Take 40 mg by mouth every evening.  10/29/14  Yes [provider]  predniSONE (DELTASONE) 5 MG tablet Take 2 tablets (10 mg total) by mouth daily with  breakfast. Patient taking differently: Take 5 mg by mouth daily with breakfast.  04/02/18  Yes Rizwan, Eunice Blase, MD  sodium chloride (OCEAN) 0.65 % SOLN nasal spray Place 1 spray into both nostrils as needed for congestion.   Yes [provider]  ZETIA 10 MG tablet Take 5 mg by mouth at bedtime. (0.5 tablet) 10/15/11  Yes [provider]  acetaminophen (TYLENOL) 325 MG tablet Take 2 tablets (650 mg total) by mouth every 6 (six) hours as needed for mild pain (pain score 1-3 or temp > 100.5). Patient not taking: Reported on 05/07/2018 04/02/18   Debbe Odea, MD  feeding supplement, ENSURE ENLIVE, (ENSURE ENLIVE) LIQD Take 237 mLs by mouth daily. Patient  not taking: Reported on 05/07/2018 04/02/18   Debbe Odea, MD  lidocaine (LIDODERM) 5 % Place 1 patch onto the skin daily as needed. Remove & Discard patch within 12 hours or as directed by MD Patient not taking: Reported on 05/07/2018 06/01/17   Cristal Ford, DO  oxycodone (OXY-IR) 5 MG capsule Take 1 capsule (5 mg total) by mouth every 4 (four) hours as needed. Patient not taking: Reported on 05/07/2018 04/07/18   Rayburn, Neta Mends, PA-C  polyethylene glycol (MIRALAX / GLYCOLAX) packet Take 17 g by mouth daily as needed for mild constipation. Patient not taking: Reported on 05/07/2018 04/02/18   Debbe Odea, MD    Family History Family History  Problem Relation Age of Onset  . Hypertension Mother   . Cancer Mother   . Kidney failure Father   . Heart disease Father     Social History Social History   Tobacco Use  . Smoking status: Former Research scientist (life sciences)  . Smokeless tobacco: Never Used  Substance Use Topics  . Alcohol use: Yes    Comment: rare  . Drug use: No     Allergies   Eliquis [apixaban]; Demerol [meperidine]; and Keppra [levetiracetam]   Review of Systems Review of Systems All other systems negative except as documented in the HPI. All pertinent positives and negatives as reviewed in the HPI.   Physical  Exam Updated Vital Signs BP 103/70   Pulse 80   Temp 98.9 F (37.2 C) (Oral)   Resp 11   Ht 5\' 9"  (1.753 m)   Wt 72.6 kg   SpO2 98%   BMI 23.63 kg/m   Physical Exam Vitals signs and nursing note reviewed.  Constitutional:      General: He is not in acute distress.    Appearance: He is well-developed.  HENT:     Head: Normocephalic and atraumatic.  Eyes:     Pupils: Pupils are equal, round, and reactive to light.  Neck:     Musculoskeletal: Normal range of motion and neck supple.  Cardiovascular:     Rate and Rhythm: Normal rate and regular rhythm.     Heart sounds: Normal heart sounds. No murmur. No friction rub. No gallop.   Pulmonary:     Effort: Pulmonary effort is normal. No respiratory distress.     Breath sounds: Normal breath sounds. No wheezing.  Abdominal:     General: Bowel sounds are normal. There is no distension.     Palpations: Abdomen is soft.     Tenderness: There is no abdominal tenderness.  Skin:    General: Skin is warm and dry.     Capillary Refill: Capillary refill takes less than 2 seconds.     Findings: No erythema or rash.  Neurological:     Mental Status: He is alert and oriented to person, place, and time.     Motor: No abnormal muscle tone.     Coordination: Coordination normal.  Psychiatric:        Behavior: Behavior normal.      ED Treatments / Results  Labs (all labs ordered are listed, but only abnormal results are displayed) Labs Reviewed  CBC WITH DIFFERENTIAL/PLATELET - Abnormal; Notable for the following components:      Result Value   RBC 2.81 (*)    Hemoglobin 8.6 (*)    HCT 28.4 (*)    MCV 101.1 (*)    Monocytes Absolute 1.5 (*)    Abs Immature Granulocytes 0.14 (*)    All  other components within normal limits  BASIC METABOLIC PANEL - Abnormal; Notable for the following components:   Glucose, Bld 100 (*)    BUN 26 (*)    Creatinine, Ser 1.33 (*)    Calcium 8.2 (*)    GFR calc non Af Amer 49 (*)    GFR calc Af  Amer 57 (*)    All other components within normal limits  URINALYSIS, ROUTINE W REFLEX MICROSCOPIC - Abnormal; Notable for the following components:   Leukocytes,Ua TRACE (*)    All other components within normal limits  URINALYSIS, MICROSCOPIC (REFLEX) - Abnormal; Notable for the following components:   Bacteria, UA RARE (*)    All other components within normal limits  LACTIC ACID, PLASMA  INFLUENZA PANEL BY PCR (TYPE A & B)    EKG None  Radiology Dg Chest 2 View  Result Date: 05/07/2018 CLINICAL DATA:  The 13 cough for 3 days. EXAM: CHEST - 2 VIEW COMPARISON:  04/20/2018 FINDINGS: Cardiomediastinal silhouette is normal. Mediastinal contours appear intact. Calcific atherosclerotic disease of the coronary arteries. There is no evidence of focal airspace consolidation, pleural effusion or pneumothorax. Persistent elevation of left hemidiaphragm with linear airspace consolidation/atelectasis or scarring in the left lung base. Osseous structures are without acute abnormality. Soft tissues are grossly normal. IMPRESSION: Persistent elevation of the left hemidiaphragm with linear airspace consolidation/atelectasis or scarring in the left lung base. No acute findings. Electronically Signed   By: Fidela Salisbury M.D.   On: 05/07/2018 13:38    Procedures Procedures (including critical care time)  Medications Ordered in ED Medications  HYDROcodone-acetaminophen (NORCO/VICODIN) 5-325 MG per tablet 1 tablet (1 tablet Oral Given 05/07/18 1519)     Initial Impression / Assessment and Plan / ED Course  I have reviewed the triage vital signs and the nursing notes.  Pertinent labs & imaging results that were available during my care of the patient were reviewed by me and considered in my medical decision making (see chart for details).        Patient so far has laboratory testing that does not show any significant abnormalities.  The patient lives at a facility where the area he is staying  and is been quarantined due to sounds like an influenza outbreak.  Final Clinical Impressions(s) / ED Diagnoses   Final diagnoses:  None    ED Discharge Orders    None       Rebeca Allegra 05/07/18 1555    Virgel Manifold, MD 05/08/18 [phone]

## 2018-05-07 NOTE — ED Provider Notes (Signed)
Physical Exam  BP 103/70   Pulse 80   Temp 98.9 F (37.2 C) (Oral)   Resp 11   Ht 5\' 9"  (1.753 m)   Wt 72.6 kg   SpO2 98%   BMI 23.63 kg/m   Physical Exam Vitals signs and nursing note reviewed.  Constitutional:      General: He is not in acute distress.    Appearance: He is well-developed. He is not diaphoretic.     Comments: Resting comfortably.  No cough noted on my exam.  HENT:     Head: Normocephalic and atraumatic.  Eyes:     General: No scleral icterus.    Conjunctiva/sclera: Conjunctivae normal.  Neck:     Musculoskeletal: Normal range of motion.  Pulmonary:     Effort: Pulmonary effort is normal. No respiratory distress.  Skin:    Findings: No rash.  Neurological:     Mental Status: He is alert.     ED Course/Procedures     Procedures  MDM  Care handed off from previous provider PA lawyer.  Please see his note for further detail.  Briefly, patient is a 85 year old male presenting for generalized fatigue and lethargy over the past 2 days.  He is status post BKA in January 2020 and was discharged home to nursing facility.  They noted cough and fever.  Denies chest pain, shortness of breath, abdominal pain.  Lab work and chest x-ray are reassuring here.  Flu swab is pending.  Will discharge home +/- Tamiflu depending on swab.  Dg Chest 2 View  Result Date: 05/07/2018 CLINICAL DATA:  The 13 cough for 3 days. EXAM: CHEST - 2 VIEW COMPARISON:  04/20/2018 FINDINGS: Cardiomediastinal silhouette is normal. Mediastinal contours appear intact. Calcific atherosclerotic disease of the coronary arteries. There is no evidence of focal airspace consolidation, pleural effusion or pneumothorax. Persistent elevation of left hemidiaphragm with linear airspace consolidation/atelectasis or scarring in the left lung base. Osseous structures are without acute abnormality. Soft tissues are grossly normal. IMPRESSION: Persistent elevation of the left hemidiaphragm with linear airspace  consolidation/atelectasis or scarring in the left lung base. No acute findings. Electronically Signed   By: Fidela Salisbury M.D.   On: 05/07/2018 13:38   Dg Chest 2 View  Result Date: 04/20/2018 CLINICAL DATA:  85 year old male choked on pills today. Possible aspiration. EXAM: CHEST - 2 VIEW COMPARISON:  03/24/2018 radiographs and earlier. FINDINGS: Semi upright AP and lateral views. Larger lung volumes compared to most prior exams. Chronic linear atelectasis or scarring at the left lung base with mild elevation of the left hemidiaphragm. Irregular chronic right anterior rib costochondral calcifications. No pneumothorax, pulmonary edema, pleural effusion or acute pulmonary opacity. Tortuous descending thoracic aorta. Other mediastinal contours are within normal limits. No acute osseous abnormality identified. Negative visible bowel gas pattern. IMPRESSION: No evidence of aspiration. Stable chest, including elevated left hemidiaphragm with associated atelectasis or scarring. Electronically Signed   By: Genevie Ann M.D.   On: 04/20/2018 14:18    5:33 PM Flu swab is negative.  Chest x-ray read demonstrates persistent elevation of left hemidiaphragm with linear atelectasis with no acute findings.  This was also present on chest x-ray on 04/20/2018.  I had a discussion with the patient regarding his symptoms.  Wife at bedside agrees that patient can be discharged back to facility.  She is concerned because he has had a decreased appetite, not interested in the food at the facility.  He appears overall well on my exam and  has improvement in his chronic pain with pain medications given here.  His vital signs remained stable.  Advised to follow-up with PCP and return to ED for any severe worsening symptoms.  Patient is hemodynamically stable, in NAD, and able to ambulate in the ED. Evaluation does not show pathology that would require ongoing emergent intervention or inpatient treatment. I explained the diagnosis to  the patient. Pain has been managed and has no complaints prior to discharge. Patient is comfortable with above plan and is stable for discharge at this time. All questions were answered prior to disposition. Strict return precautions for returning to the ED were discussed. Encouraged follow up with PCP.    Portions of this note were generated with [employer]. Dictation errors may occur despite best attempts at proofreading.        Delia Heady, PA-C 05/07/18 1734    Margette Fast, MD 05/08/18 [phone]

## 2018-05-07 NOTE — Patient Outreach (Signed)
[facility]) Care Management  05/07/2018  [name] 01/04/1934 [id number]  Member currently at [facility] due to Viral URI. Hospital Liaisons and [facility] made aware.   Will follow up with member when discharged to home pending disposition and needs.  [name], RN-BSN  [facility] Care Management  Community Care Management Coordinator  [phone] [email or web address]

## 2018-05-07 NOTE — ED Notes (Signed)
Attempted to call report to [facility]; no answer

## 2018-05-07 NOTE — ED Triage Notes (Addendum)
Pt brought in by ems for c.o lethargy for the past few days ; pt recently had a BNA to the right leg in January ; Per NP note , patient has been having a cough , temp, rhonci , cough and dysuria ; pt denies any dysuria but does state he has had a slight cough ; pt alert and oriented x 4

## 2018-05-07 NOTE — Discharge Instructions (Signed)
Please return to the ED if you start to have worsening fever, productive cough, leg swelling, shortness of breath or abdominal pain.

## 2018-05-07 NOTE — ED Notes (Signed)
1 visitor sent back

## 2018-05-09 ENCOUNTER — Inpatient Hospital Stay (HOSPITAL_COMMUNITY)
Admission: EM | Admit: 2018-05-09 | Discharge: 2018-05-12 | DRG: 565 | Disposition: A | Discharge status: Skilled Nursing Facility | Payer: Medicare Other | Source: Skilled Nursing Facility | Attending: Internal Medicine | Admitting: Internal Medicine

## 2018-05-09 ENCOUNTER — Ambulatory Visit (INDEPENDENT_AMBULATORY_CARE_PROVIDER_SITE_OTHER): Payer: Medicare Other | Admitting: Physician Assistant

## 2018-05-09 ENCOUNTER — Telehealth (INDEPENDENT_AMBULATORY_CARE_PROVIDER_SITE_OTHER): Payer: Self-pay

## 2018-05-09 ENCOUNTER — Other Ambulatory Visit: Payer: Self-pay

## 2018-05-09 ENCOUNTER — Emergency Department (HOSPITAL_COMMUNITY): Payer: Medicare Other

## 2018-05-09 ENCOUNTER — Encounter (HOSPITAL_COMMUNITY): Payer: Self-pay | Admitting: Emergency Medicine

## 2018-05-09 DIAGNOSIS — K219 Gastro-esophageal reflux disease without esophagitis: Secondary | ICD-10-CM | POA: Diagnosis present

## 2018-05-09 DIAGNOSIS — Z9842 Cataract extraction status, left eye: Secondary | ICD-10-CM

## 2018-05-09 DIAGNOSIS — Z89512 Acquired absence of left leg below knee: Secondary | ICD-10-CM

## 2018-05-09 DIAGNOSIS — D631 Anemia in chronic kidney disease: Secondary | ICD-10-CM | POA: Diagnosis present

## 2018-05-09 DIAGNOSIS — D638 Anemia in other chronic diseases classified elsewhere: Secondary | ICD-10-CM | POA: Diagnosis not present

## 2018-05-09 DIAGNOSIS — Z888 Allergy status to other drugs, medicaments and biological substances status: Secondary | ICD-10-CM | POA: Diagnosis not present

## 2018-05-09 DIAGNOSIS — I5032 Chronic diastolic (congestive) heart failure: Secondary | ICD-10-CM | POA: Diagnosis present

## 2018-05-09 DIAGNOSIS — G40909 Epilepsy, unspecified, not intractable, without status epilepticus: Secondary | ICD-10-CM

## 2018-05-09 DIAGNOSIS — T148XXA Other injury of unspecified body region, initial encounter: Secondary | ICD-10-CM | POA: Diagnosis not present

## 2018-05-09 DIAGNOSIS — I482 Chronic atrial fibrillation, unspecified: Secondary | ICD-10-CM | POA: Diagnosis present

## 2018-05-09 DIAGNOSIS — Z981 Arthrodesis status: Secondary | ICD-10-CM

## 2018-05-09 DIAGNOSIS — Y835 Amputation of limb(s) as the cause of abnormal reaction of the patient, or of later complication, without mention of misadventure at the time of the procedure: Secondary | ICD-10-CM | POA: Diagnosis present

## 2018-05-09 DIAGNOSIS — T8743 Infection of amputation stump, right lower extremity: Principal | ICD-10-CM | POA: Diagnosis present

## 2018-05-09 DIAGNOSIS — Z7952 Long term (current) use of systemic steroids: Secondary | ICD-10-CM

## 2018-05-09 DIAGNOSIS — F5104 Psychophysiologic insomnia: Secondary | ICD-10-CM | POA: Diagnosis present

## 2018-05-09 DIAGNOSIS — Z7983 Long term (current) use of bisphosphonates: Secondary | ICD-10-CM

## 2018-05-09 DIAGNOSIS — Z8249 Family history of ischemic heart disease and other diseases of the circulatory system: Secondary | ICD-10-CM

## 2018-05-09 DIAGNOSIS — Z96651 Presence of right artificial knee joint: Secondary | ICD-10-CM | POA: Diagnosis present

## 2018-05-09 DIAGNOSIS — I2510 Atherosclerotic heart disease of native coronary artery without angina pectoris: Secondary | ICD-10-CM | POA: Diagnosis present

## 2018-05-09 DIAGNOSIS — Z9841 Cataract extraction status, right eye: Secondary | ICD-10-CM

## 2018-05-09 DIAGNOSIS — Z885 Allergy status to narcotic agent status: Secondary | ICD-10-CM

## 2018-05-09 DIAGNOSIS — R52 Pain, unspecified: Secondary | ICD-10-CM

## 2018-05-09 DIAGNOSIS — E7800 Pure hypercholesterolemia, unspecified: Secondary | ICD-10-CM | POA: Diagnosis present

## 2018-05-09 DIAGNOSIS — Z85828 Personal history of other malignant neoplasm of skin: Secondary | ICD-10-CM

## 2018-05-09 DIAGNOSIS — Z79899 Other long term (current) drug therapy: Secondary | ICD-10-CM

## 2018-05-09 DIAGNOSIS — L089 Local infection of the skin and subcutaneous tissue, unspecified: Secondary | ICD-10-CM | POA: Diagnosis not present

## 2018-05-09 DIAGNOSIS — Z955 Presence of coronary angioplasty implant and graft: Secondary | ICD-10-CM | POA: Diagnosis not present

## 2018-05-09 DIAGNOSIS — I130 Hypertensive heart and chronic kidney disease with heart failure and stage 1 through stage 4 chronic kidney disease, or unspecified chronic kidney disease: Secondary | ICD-10-CM | POA: Diagnosis present

## 2018-05-09 DIAGNOSIS — Z87891 Personal history of nicotine dependence: Secondary | ICD-10-CM

## 2018-05-09 DIAGNOSIS — E872 Acidosis: Secondary | ICD-10-CM | POA: Diagnosis present

## 2018-05-09 DIAGNOSIS — N1831 Chronic kidney disease, stage 3a: Secondary | ICD-10-CM | POA: Diagnosis present

## 2018-05-09 DIAGNOSIS — E039 Hypothyroidism, unspecified: Secondary | ICD-10-CM | POA: Diagnosis present

## 2018-05-09 DIAGNOSIS — L03115 Cellulitis of right lower limb: Secondary | ICD-10-CM | POA: Diagnosis present

## 2018-05-09 DIAGNOSIS — N183 Chronic kidney disease, stage 3 (moderate): Secondary | ICD-10-CM | POA: Diagnosis present

## 2018-05-09 DIAGNOSIS — E785 Hyperlipidemia, unspecified: Secondary | ICD-10-CM | POA: Diagnosis present

## 2018-05-09 DIAGNOSIS — Z79890 Hormone replacement therapy (postmenopausal): Secondary | ICD-10-CM

## 2018-05-09 DIAGNOSIS — Z7982 Long term (current) use of aspirin: Secondary | ICD-10-CM

## 2018-05-09 DIAGNOSIS — Z8673 Personal history of transient ischemic attack (TIA), and cerebral infarction without residual deficits: Secondary | ICD-10-CM

## 2018-05-09 DIAGNOSIS — Z841 Family history of disorders of kidney and ureter: Secondary | ICD-10-CM

## 2018-05-09 LAB — CBC WITH DIFFERENTIAL/PLATELET
Abs Immature Granulocytes: 0.2 10*3/uL — ABNORMAL HIGH (ref 0.00–0.07)
Basophils Absolute: 0 10*3/uL (ref 0.0–0.1)
Basophils Relative: 0 %
Eosinophils Absolute: 0.1 10*3/uL (ref 0.0–0.5)
Eosinophils Relative: 1 %
HCT: 31.7 % — ABNORMAL LOW (ref 39.0–52.0)
Hemoglobin: 9.5 g/dL — ABNORMAL LOW (ref 13.0–17.0)
Immature Granulocytes: 3 %
Lymphocytes Relative: 8 %
Lymphs Abs: 0.6 10*3/uL — ABNORMAL LOW (ref 0.7–4.0)
MCH: 29.7 pg (ref 26.0–34.0)
MCHC: 30 g/dL (ref 30.0–36.0)
MCV: 99.1 fL (ref 80.0–100.0)
Monocytes Absolute: 0.8 10*3/uL (ref 0.1–1.0)
Monocytes Relative: 10 %
Neutro Abs: 6.4 10*3/uL (ref 1.7–7.7)
Neutrophils Relative %: 78 %
Platelets: 224 10*3/uL (ref 150–400)
RBC: 3.2 MIL/uL — ABNORMAL LOW (ref 4.22–5.81)
RDW: 14.3 % (ref 11.5–15.5)
WBC: 8 10*3/uL (ref 4.0–10.5)
nRBC: 0 % (ref 0.0–0.2)

## 2018-05-09 LAB — COMPREHENSIVE METABOLIC PANEL
ALT: 43 U/L (ref 0–44)
AST: 38 U/L (ref 15–41)
Albumin: 3 g/dL — ABNORMAL LOW (ref 3.5–5.0)
Alkaline Phosphatase: 56 U/L (ref 38–126)
Anion gap: 9 (ref 5–15)
BUN: 25 mg/dL — ABNORMAL HIGH (ref 8–23)
CO2: 24 mmol/L (ref 22–32)
Calcium: 8.8 mg/dL — ABNORMAL LOW (ref 8.9–10.3)
Chloride: 107 mmol/L (ref 98–111)
Creatinine, Ser: 1.51 mg/dL — ABNORMAL HIGH (ref 0.61–1.24)
GFR calc Af Amer: 49 mL/min — ABNORMAL LOW (ref >60)
GFR calc non Af Amer: 42 mL/min — ABNORMAL LOW (ref >60)
Glucose, Bld: 115 mg/dL — ABNORMAL HIGH (ref 70–99)
Potassium: 5.4 mmol/L — ABNORMAL HIGH (ref 3.5–5.1)
Sodium: 140 mmol/L (ref 135–145)
Total Bilirubin: 0.5 mg/dL (ref 0.3–1.2)
Total Protein: 5.7 g/dL — ABNORMAL LOW (ref 6.5–8.1)

## 2018-05-09 LAB — LACTIC ACID, PLASMA
Lactic Acid, Venous: 2.1 mmol/L (ref 0.5–1.9)
Lactic Acid, Venous: 1.4 mmol/L (ref 0.5–1.9)

## 2018-05-09 MED ORDER — DOCUSATE SODIUM 100 MG PO CAPS
100.0000 mg | ORAL_CAPSULE | Freq: Two times a day (BID) | ORAL | Status: DC
  Administered 2018-05-09 – 2018-05-10 (×2): 100 mg via ORAL
  Filled 2018-05-09 (×2): qty 1

## 2018-05-09 MED ORDER — METOPROLOL TARTRATE 25 MG PO TABS
25.0000 mg | ORAL_TABLET | Freq: Two times a day (BID) | ORAL | Status: DC
  Administered 2018-05-09 – 2018-05-12 (×6): 25 mg via ORAL
  Filled 2018-05-09 (×6): qty 1

## 2018-05-09 MED ORDER — PRO-STAT SUGAR FREE PO LIQD
30.0000 mL | Freq: Two times a day (BID) | ORAL | Status: DC
  Administered 2018-05-09 – 2018-05-12 (×6): 30 mL via ORAL
  Filled 2018-05-09 (×6): qty 30

## 2018-05-09 MED ORDER — ACETAMINOPHEN 650 MG RE SUPP: 650.0000 mg | Freq: Four times a day (QID) | RECTAL | Status: DC | PRN

## 2018-05-09 MED ORDER — PREDNISONE 5 MG PO TABS
5.0000 mg | ORAL_TABLET | Freq: Every day | ORAL | Status: DC
  Administered 2018-05-10 – 2018-05-12 (×3): 5 mg via ORAL
  Filled 2018-05-09 (×3): qty 1

## 2018-05-09 MED ORDER — ACETAMINOPHEN 325 MG PO TABS: 650.0000 mg | ORAL_TABLET | Freq: Four times a day (QID) | ORAL | Status: DC | PRN

## 2018-05-09 MED ORDER — ADULT MULTIVITAMIN W/MINERALS CH
1.0000 | ORAL_TABLET | Freq: Every evening | ORAL | Status: DC
  Administered 2018-05-09 – 2018-05-11 (×3): 1 via ORAL
  Filled 2018-05-09 (×3): qty 1

## 2018-05-09 MED ORDER — VANCOMYCIN HCL IN DEXTROSE 1-5 GM/200ML-% IV SOLN
1000.0000 mg | INTRAVENOUS | Status: DC
  Administered 2018-05-10 – 2018-05-11 (×2): 1000 mg via INTRAVENOUS
  Filled 2018-05-09 (×4): qty 200

## 2018-05-09 MED ORDER — PRAVASTATIN SODIUM 40 MG PO TABS
40.0000 mg | ORAL_TABLET | Freq: Every evening | ORAL | Status: DC
  Administered 2018-05-09 – 2018-05-11 (×3): 40 mg via ORAL
  Filled 2018-05-09 (×3): qty 1

## 2018-05-09 MED ORDER — LEVOTHYROXINE SODIUM 75 MCG PO TABS
150.0000 ug | ORAL_TABLET | Freq: Every day | ORAL | Status: DC
  Administered 2018-05-10 – 2018-05-12 (×3): 150 ug via ORAL
  Filled 2018-05-09 (×3): qty 2

## 2018-05-09 MED ORDER — ONDANSETRON HCL 4 MG PO TABS: 4.0000 mg | ORAL_TABLET | Freq: Four times a day (QID) | ORAL | Status: DC | PRN

## 2018-05-09 MED ORDER — PIPERACILLIN-TAZOBACTAM 3.375 G IVPB 30 MIN: 3.3750 g | Freq: Once | INTRAVENOUS | Status: DC

## 2018-05-09 MED ORDER — FLUTICASONE PROPIONATE 50 MCG/ACT NA SUSP
2.0000 | Freq: Every day | NASAL | Status: DC | PRN
  Filled 2018-05-09: qty 16

## 2018-05-09 MED ORDER — PANTOPRAZOLE SODIUM 40 MG PO TBEC
40.0000 mg | DELAYED_RELEASE_TABLET | Freq: Every day | ORAL | Status: DC
  Administered 2018-05-10 – 2018-05-12 (×3): 40 mg via ORAL
  Filled 2018-05-09 (×3): qty 1

## 2018-05-09 MED ORDER — ONDANSETRON HCL 4 MG/2ML IJ SOLN: 4.0000 mg | Freq: Four times a day (QID) | INTRAMUSCULAR | Status: DC | PRN

## 2018-05-09 MED ORDER — SODIUM CHLORIDE 0.9 % IV SOLN
2.0000 g | INTRAVENOUS | Status: DC
  Administered 2018-05-09: 2 g via INTRAVENOUS
  Filled 2018-05-09: qty 20

## 2018-05-09 MED ORDER — ASPIRIN EC 325 MG PO TBEC
325.0000 mg | DELAYED_RELEASE_TABLET | Freq: Every day | ORAL | Status: DC
  Administered 2018-05-10: 325 mg via ORAL
  Filled 2018-05-09: qty 1

## 2018-05-09 MED ORDER — DIVALPROEX SODIUM 250 MG PO DR TAB
500.0000 mg | DELAYED_RELEASE_TABLET | Freq: Every day | ORAL | Status: DC
  Administered 2018-05-09 – 2018-05-11 (×3): 500 mg via ORAL
  Filled 2018-05-09 (×3): qty 2

## 2018-05-09 MED ORDER — ENOXAPARIN SODIUM 40 MG/0.4ML ~~LOC~~ SOLN
40.0000 mg | SUBCUTANEOUS | Status: DC
  Filled 2018-05-09 (×2): qty 0.4

## 2018-05-09 MED ORDER — VITAMIN B-12 100 MCG PO TABS
500.0000 ug | ORAL_TABLET | Freq: Every day | ORAL | Status: DC
  Administered 2018-05-09 – 2018-05-11 (×3): 500 ug via ORAL
  Filled 2018-05-09 (×3): qty 5

## 2018-05-09 MED ORDER — EZETIMIBE 10 MG PO TABS
5.0000 mg | ORAL_TABLET | Freq: Every day | ORAL | Status: DC
  Administered 2018-05-09 – 2018-05-11 (×3): 5 mg via ORAL
  Filled 2018-05-09 (×3): qty 1

## 2018-05-09 MED ORDER — METHOCARBAMOL 500 MG PO TABS: 500.0000 mg | ORAL_TABLET | Freq: Four times a day (QID) | ORAL | Status: DC | PRN

## 2018-05-09 MED ORDER — VANCOMYCIN HCL IN DEXTROSE 1-5 GM/200ML-% IV SOLN: 1000.0000 mg | Freq: Once | INTRAVENOUS | Status: DC

## 2018-05-09 MED ORDER — VITAMIN D 25 MCG (1000 UNIT) PO TABS
1000.0000 [IU] | ORAL_TABLET | Freq: Every day | ORAL | Status: DC
  Administered 2018-05-10 – 2018-05-12 (×3): 1000 [IU] via ORAL
  Filled 2018-05-09 (×5): qty 1

## 2018-05-09 MED ORDER — POLYETHYLENE GLYCOL 3350 17 GM/SCOOP PO POWD: 1.0000 | ORAL | Status: DC | PRN

## 2018-05-09 MED ORDER — CALCIUM CARBONATE-VITAMIN D 500-200 MG-UNIT PO TABS
1.0000 | ORAL_TABLET | Freq: Every day | ORAL | Status: DC
  Administered 2018-05-10 – 2018-05-12 (×3): 1 via ORAL
  Filled 2018-05-09 (×3): qty 1

## 2018-05-09 MED ORDER — DOXAZOSIN MESYLATE 2 MG PO TABS
2.0000 mg | ORAL_TABLET | Freq: Every evening | ORAL | Status: DC
  Administered 2018-05-09 – 2018-05-11 (×3): 2 mg via ORAL
  Filled 2018-05-09 (×3): qty 1

## 2018-05-09 MED ORDER — LACTINEX PO CHEW
1.0000 | CHEWABLE_TABLET | Freq: Three times a day (TID) | ORAL | Status: DC
  Administered 2018-05-10 – 2018-05-11 (×5): 1 via ORAL
  Filled 2018-05-09 (×8): qty 1

## 2018-05-09 MED ORDER — IPRATROPIUM-ALBUTEROL 0.5-2.5 (3) MG/3ML IN SOLN: 3.0000 mL | RESPIRATORY_TRACT | Status: DC | PRN

## 2018-05-09 MED ORDER — FENTANYL CITRATE (PF) 100 MCG/2ML IJ SOLN
50.0000 ug | Freq: Once | INTRAMUSCULAR | Status: DC
  Filled 2018-05-09: qty 2

## 2018-05-09 MED ORDER — SALINE SPRAY 0.65 % NA SOLN
1.0000 | NASAL | Status: DC | PRN
  Filled 2018-05-09: qty 44

## 2018-05-09 MED ORDER — DIVALPROEX SODIUM 250 MG PO DR TAB
250.0000 mg | DELAYED_RELEASE_TABLET | Freq: Every day | ORAL | Status: DC
  Administered 2018-05-10 – 2018-05-12 (×3): 250 mg via ORAL
  Filled 2018-05-09 (×3): qty 1

## 2018-05-09 MED ORDER — HYDROCODONE-ACETAMINOPHEN 10-325 MG PO TABS
1.0000 | ORAL_TABLET | ORAL | Status: DC | PRN
  Administered 2018-05-09 – 2018-05-12 (×13): 1 via ORAL
  Filled 2018-05-09 (×13): qty 1

## 2018-05-09 MED ORDER — FERROUS SULFATE 325 (65 FE) MG PO TABS
325.0000 mg | ORAL_TABLET | Freq: Two times a day (BID) | ORAL | Status: DC
  Administered 2018-05-10 – 2018-05-12 (×5): 325 mg via ORAL
  Filled 2018-05-09 (×5): qty 1

## 2018-05-09 MED ORDER — VANCOMYCIN HCL 10 G IV SOLR
1750.0000 mg | Freq: Once | INTRAVENOUS | Status: AC
  Administered 2018-05-09: 1750 mg via INTRAVENOUS
  Filled 2018-05-09 (×2): qty 1750

## 2018-05-09 MED ORDER — SODIUM CHLORIDE 0.9% FLUSH
3.0000 mL | Freq: Once | INTRAVENOUS | Status: AC
  Administered 2018-05-09: 3 mL via INTRAVENOUS

## 2018-05-09 MED ORDER — PIPERACILLIN-TAZOBACTAM 3.375 G IVPB
3.3750 g | Freq: Three times a day (TID) | INTRAVENOUS | Status: DC
  Administered 2018-05-09 – 2018-05-12 (×8): 3.375 g via INTRAVENOUS
  Filled 2018-05-09 (×8): qty 50

## 2018-05-09 MED ORDER — DIVALPROEX SODIUM 250 MG PO DR TAB: 250.0000 mg | DELAYED_RELEASE_TABLET | Freq: Two times a day (BID) | ORAL | Status: DC

## 2018-05-09 MED ORDER — BISACODYL 10 MG RE SUPP: 10.0000 mg | Freq: Every day | RECTAL | Status: DC | PRN

## 2018-05-09 MED ORDER — DILTIAZEM HCL ER COATED BEADS 180 MG PO CP24
300.0000 mg | ORAL_CAPSULE | Freq: Every day | ORAL | Status: DC
  Administered 2018-05-09 – 2018-05-12 (×4): 300 mg via ORAL
  Filled 2018-05-09 (×4): qty 1

## 2018-05-09 MED ORDER — NITROGLYCERIN 0.4 MG SL SUBL: 0.4000 mg | SUBLINGUAL_TABLET | SUBLINGUAL | Status: DC | PRN

## 2018-05-09 MED ORDER — SODIUM CHLORIDE 0.9 % IV SOLN
INTRAVENOUS | Status: DC
  Administered 2018-05-09: 22:00:00 via INTRAVENOUS

## 2018-05-09 NOTE — H&P (Signed)
History and Physical    [name] VVO:[id number] DOB: 1933-10-01 DOA: 05/09/2018  PCP: [name], MD   Patient coming from: Home   Chief Complaint: Left BKA stump erythema and pain.   HPI: [name] is a 85 y.o. 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  Patient is currently in a nursing facility for rehabilitation as a consequence of a recent right below the knee amputation, March 28, 2018.  Apparently his wound has been healing well, until about 7 days ago when he was noted to be febrile, apparently blood work at the skilled nursing facility showed leukocytosis, and he was sent to the emergency department on February 26, he was screened for influenza which turned to be negative, his chest x-ray was negative for infiltrates and he was transferred back to skilled nursing facility.  Patient reported pain on his right lower extremity stump last night and this morning.  His dressings were changed and he was noted to have significant erythema at the surgical site, very small amount of drainage that apparently was not purulent.  Due to changes in his wound he was transferred to the hospital for further evaluation.  All information was obtained from his wife at the bedside, he has difficulty giving details due to his decreased memory, he is unable to give detailed history.   ED Course: Patient was found to have erythema on his right BKA, diagnosed with possible infection, Dr. [name] was consulted who recommend IV antibiotics.  Patient will be admitted for further treatment evaluation.  Review of Systems:  1. General: Positive fevers, no chills, no weight gain or weight loss 2. ENT: No runny nose or sore throat, no hearing disturbances 3. Pulmonary: No dyspnea, cough, wheezing, or hemoptysis 4. Cardiovascular: No  angina, claudication, lower extremity edema, pnd or orthopnea 5. Gastrointestinal: No nausea or vomiting, no diarrhea or constipation 6. Hematology: No easy bruisability or frequent infections 7. Urology: No dysuria, hematuria or increased urinary frequency 8. Dermatology: No rashes. 9. Neurology: No seizures or paresthesias 10. Musculoskeletal: No joint pain or deformities/ [pain on his right lower extremity stump.   Past Medical History:  Diagnosis Date  . Abnormality of gait 09/22/2015  . Arthritis   . Atrial fibrillation ([place])   . CAD (coronary artery disease)    Stent to RCA, Penta stent, 99% reduced to 0% 2002.  . Cancer ([place])    skin CA removed from back  . Chronic insomnia 03/30/2015  . Complication of anesthesia    trouble waking up  . GERD (gastroesophageal reflux disease)   . Hypercholesteremia   . Hypertension   . Hypothyroidism   . Memory difficulty 09/22/2015  . Osteoarthritis   . Seizures ([place])   . Sepsis ([place]) 05/2017  . Transient alteration of awareness 03/30/2015  . Vertigo    hx of    Past Surgical History:  Procedure Laterality Date  . AMPUTATION Right 03/28/2018   Procedure: AMPUTATION BELOW KNEE;  Surgeon: Newt Minion, MD;  Location: [facility];  Service: Orthopedics;  Laterality: Right;  . BACK SURGERY    . EYE SURGERY     Bilateral Cataract surgery   . HERNIA REPAIR    . I&D EXTREMITY Right 05/10/2015   Procedure: IRRIGATION AND DEBRIDEMENT EXTREMITY;  Surgeon: Leanora Cover, MD;  Location: [facility];  Service: Orthopedics;  Laterality: Right;  .  KNEE ARTHROPLASTY     right knee X 2; left knee once  . LAMINECTOMY     X 6  . POSTERIOR CERVICAL FUSION/FORAMINOTOMY  01/28/2012   Procedure: POSTERIOR CERVICAL FUSION/FORAMINOTOMY LEVEL 3;  Surgeon: [name], MD;  Location: [facility];  Service: Neurosurgery;  Laterality: Left;  Posterior Cervical Five-Thoracic One Fusion, Arthrodesis with LEFT Cervical Seven-thoracic One Laminectomy,  Foraminotomy and Resection of Synovial Cyst  . POSTERIOR CERVICAL FUSION/FORAMINOTOMY N/A 01/29/2013   Procedure: POSTERIOR CERVICAL FUSION/FORAMINOTOMY LEVEL 1 and C2-5 Posteriolateral Arthrodesis;  Surgeon: [name], MD;  Location: [facility];  Service: Neurosurgery;  Laterality: N/A;  C2-C3 Laminectomy C2-C3 posterior cervical arthrodesis  . TONSILLECTOMY       reports that he has quit smoking. He has never used smokeless tobacco. He reports current alcohol use. He reports that he does not use drugs.  Allergies  Allergen Reactions  . Eliquis [Apixaban] Other (See Comments)    bleeding  . Demerol [Meperidine] Nausea And Vomiting  . Keppra [Levetiracetam] Other (See Comments)    Causes sleepiness, mental status changes    Family History  Problem Relation Age of Onset  . Hypertension Mother   . Cancer Mother   . Kidney failure Father   . Heart disease Father      Prior to Admission medications   Medication Sig Start Date End Date Taking? Authorizing Provider  acetaminophen (TYLENOL) 325 MG tablet Take 2 tablets (650 mg total) by mouth every 6 (six) hours as needed for mild pain (pain score 1-3 or temp > 100.5). Patient not taking: Reported on 05/07/2018 04/02/18   Debbe Odea, MD  alendronate (FOSAMAX) 70 MG tablet Take 70 mg by mouth once a week. On Wednesday. Remain upright for 30-60 minutes. 01/06/12   [provider]  Amino Acids-Protein Hydrolys (FEEDING SUPPLEMENT, PRO-STAT SUGAR FREE 64,) LIQD Take 30 mLs by mouth 2 (two) times daily. 07/05/17   Lavina Hamman, MD  aspirin EC 325 MG EC tablet Take 1 tablet (325 mg total) by mouth daily. 04/03/18   Debbe Odea, MD  bisacodyl (DULCOLAX) 10 MG suppository Place 1 suppository (10 mg total) rectally daily as needed for moderate constipation. 04/02/18   Debbe Odea, MD  calcium-vitamin D (OSCAL WITH D) 500-200 MG-UNIT tablet Take 1 tablet by mouth daily with breakfast. 500 mg (1250 mg)-200    [provider]  cholecalciferol (VITAMIN D) 25 MCG (1000 UT) tablet Take 1,000 Units by mouth daily.    [provider]  cyanocobalamin 500 MCG tablet Take 500 mcg by mouth at bedtime.    [provider]  diclofenac sodium (VOLTAREN) 1 % GEL Apply 2 g topically daily as needed (pain).    [provider]  diltiazem (CARDIZEM CD) 300 MG 24 hr capsule TAKE 1 CAPSULE(300 MG) BY MOUTH EVERY MORNING. DO NOT CRUSH Patient taking differently: Take 300 mg by mouth daily. For HTN hold med for SBP less than 110 and DBP less than 60 12/03/17   Croitoru, Mihai, MD  divalproex (DEPAKOTE) 250 MG DR tablet Take 250-500 mg by mouth 2 (two) times daily. Takes 250 mg tablet in the morning and 500 mg  in the evening.    [provider]  docusate sodium (COLACE) 100 MG capsule Take 1 capsule (100 mg total) by mouth 2 (two) times daily. 04/02/18   Debbe Odea, MD  doxazosin (CARDURA) 4 MG tablet Take 2 mg by mouth every evening. Hold med for SBP less  than 110  And DBP less than 60    [provider]  feeding supplement, ENSURE ENLIVE, (ENSURE ENLIVE) LIQD Take 237 mLs by mouth daily. Patient not taking: Reported on 05/07/2018 04/02/18   Debbe Odea, MD  ferrous sulfate 325 (65 FE) MG tablet Take 1 tablet (325 mg total) by mouth 2 (two) times daily with a meal. 06/28/17   Skeet Latch, MD  fluticasone [facility]) 50 MCG/ACT nasal spray Place 2 sprays into both nostrils daily as needed for allergies or rhinitis.    [provider]  furosemide (LASIX) 40 MG tablet Take 0.5 tablets (20 mg total) by mouth every morning. Or per cardiologist instructions since patient cannot be weighed Patient taking differently: Take 20 mg by mouth every morning.  04/02/18   Debbe Odea, MD  HYDROcodone-acetaminophen (NORCO) 10-325 MG tablet Take 1 tablet by mouth every 4 (four) hours as needed.    [provider]  ipratropium-albuterol (DUONEB) 0.5-2.5 (3) MG/3ML SOLN Take 3 mLs  by nebulization every 4 (four) hours as needed.    [provider]  lactobacillus acidophilus & bulgar (LACTINEX) chewable tablet Chew 1 tablet by mouth 3 (three) times daily with meals. 03/19/18   Roxan Hockey, MD  levothyroxine (SYNTHROID, LEVOTHROID) 150 MCG tablet Take 150 mcg by mouth daily before breakfast. 12/27/11   [provider]  Lidocaine (ASPERCREME LIDOCAINE) 4 % PTCH Apply 1 patch topically 2 (two) times daily.     [provider]  lidocaine (LIDODERM) 5 % Place 1 patch onto the skin daily as needed. Remove & Discard patch within 12 hours or as directed by MD Patient not taking: Reported on 05/07/2018 06/01/17   Cristal Ford, DO  magnesium citrate SOLN Take 296 mLs (1 Bottle total) by mouth once as needed for severe constipation. 04/02/18   Debbe Odea, MD  Menthol-Methyl Salicylate (MUSCLE RUB EX) Apply 1 application topically daily as needed (shoulder pain).    [provider]  methocarbamol (ROBAXIN) 500 MG tablet Take 1 tablet (500 mg total) by mouth every 6 (six) hours as needed for muscle spasms. 04/02/18   Debbe Odea, MD  metoprolol tartrate (LOPRESSOR) 25 MG tablet Take 1 tablet (25 mg total) by mouth 2 (two) times daily. Take with or immediately following a meal. 11/06/17   Croitoru, Mihai, MD  Multiple Vitamins-Minerals (CERTAVITE/ANTIOXIDANTS) TABS Take 1 tablet by mouth every evening. 18-400 mg    [provider]  nitroGLYCERIN (NITROSTAT) 0.4 MG SL tablet Place 0.4 mg under the tongue every 5 (five) minutes as needed for chest pain. Max of 3 tablets    [provider]  omeprazole (PRILOSEC) 20 MG capsule Take 20 mg by mouth daily before breakfast.  12/23/11   [provider]  oseltamivir (TAMIFLU) 30 MG capsule Take 30 mg by mouth daily.    [provider]  oxycodone (OXY-IR) 5 MG capsule Take 1 capsule (5 mg total) by mouth every 4 (four) hours as needed. Patient not taking: Reported on 05/07/2018  04/07/18   Rayburn, Neta Mends, PA-C  polyethylene glycol (MIRALAX / GLYCOLAX) packet Take 17 g by mouth daily as needed for mild constipation. Patient not taking: Reported on 05/07/2018 04/02/18   Debbe Odea, MD  polyethylene glycol powder (GLYCOLAX/MIRALAX) powder Take 1 Container by mouth as needed for moderate constipation.  03/03/18   [provider]  potassium chloride SA (K-DUR,KLOR-CON) 20 MEQ tablet TAKE 1 TABLET BY MOUTH DAILY ALONG WITH LASIX 40MG  OR AS DIRECTED BY CARDIOLOGIST Patient taking  differently: Take 20 mEq by mouth daily.  03/11/18   Croitoru, Mihai, MD  pravastatin (PRAVACHOL) 40 MG tablet Take 40 mg by mouth every evening.  10/29/14   [provider]  predniSONE (DELTASONE) 5 MG tablet Take 2 tablets (10 mg total) by mouth daily with breakfast. Patient taking differently: Take 5 mg by mouth daily with breakfast.  04/02/18   Debbe Odea, MD  sodium chloride (OCEAN) 0.65 % SOLN nasal spray Place 1 spray into both nostrils as needed for congestion.    [provider]  ZETIA 10 MG tablet Take 5 mg by mouth at bedtime. (0.5 tablet) 10/15/11   [provider]    Physical Exam: Vitals:   05/09/18 1217  BP: 127/72  Pulse: 88  Resp: 18  Temp: 98.8 F (37.1 C)  TempSrc: Oral  SpO2: 97%    Vitals:   05/09/18 1217  BP: 127/72  Pulse: 88  Resp: 18  Temp: 98.8 F (37.1 C)  TempSrc: Oral  SpO2: 97%   General: deconditioned and ill looking appearing Neurology: Awake and alert, non focal Head and Neck. Head normocephalic. Neck supple with no adenopathy or thyromegaly.   E ENT: positive pallor, no icterus, oral mucosa moist Cardiovascular: No JVD. S1-S2 present, rhythmic, no gallops, rubs, or murmurs. No left lower extremity edema. Pulmonary: positive breath sounds bilaterally, adequate air movement, no wheezing, rhonchi or rales. Gastrointestinal. Abdomen with no organomegaly, non tender, no rebound or guarding Skin.  Erythematous rash on the right BKA stump, about 4 to 5 cm with ill defined margins, no frank purulence, tenderness, fluctuation or increase in local temperature, positive crusts at the margins of the surgical wound.   Musculoskeletal: no joint deformities         Labs on Admission: I have personally reviewed following labs and imaging studies  CBC: Recent Labs  Lab 05/07/18 1106 05/09/18 1517  WBC 7.4 8.0  NEUTROABS 4.9 6.4  HGB 8.6* 9.5*  HCT 28.4* 31.7*  MCV 101.1* 99.1  PLT 206 734   Basic Metabolic Panel: Recent Labs  Lab 05/07/18 1106 05/09/18 1218  NA 138 140  K 4.2 5.4*  CL 106 107  CO2 23 24  GLUCOSE 100* 115*  BUN 26* 25*  CREATININE 1.33* 1.51*  CALCIUM 8.2* 8.8*   GFR: Estimated Creatinine Clearance: 37.7 mL/min (A) (by C-G formula based on SCr of 1.51 mg/dL (H)). Liver Function Tests: Recent Labs  Lab 05/09/18 1218  AST 38  ALT 43  ALKPHOS 56  BILITOT 0.5  PROT 5.7*  ALBUMIN 3.0*   No results for input(s): LIPASE, AMYLASE in the last 168 hours. No results for input(s): AMMONIA in the last 168 hours. Coagulation Profile: No results for input(s): INR, PROTIME in the last 168 hours. Cardiac Enzymes: No results for input(s): CKTOTAL, CKMB, CKMBINDEX, TROPONINI in the last 168 hours. BNP (last 3 results) Recent Labs    10/22/17 1120 11/22/17 1130 04/23/18 1355  PROBNP 3,243* 3,962* 3,612*   HbA1C: No results for input(s): HGBA1C in the last 72 hours. CBG: No results for input(s): GLUCAP in the last 168 hours. Lipid Profile: No results for input(s): CHOL, HDL, LDLCALC, TRIG, CHOLHDL, LDLDIRECT in the last 72 hours. Thyroid Function Tests: No results for input(s): TSH, T4TOTAL, FREET4, T3FREE, THYROIDAB in the last 72 hours. Anemia Panel: No results for input(s): VITAMINB12, FOLATE, FERRITIN, TIBC, IRON, RETICCTPCT in the last 72 hours. Urine analysis:    Component Value Date/Time   COLORURINE YELLOW 05/07/2018 1106  APPEARANCEUR  CLEAR 05/07/2018 1106   LABSPEC 1.010 05/07/2018 1106   PHURINE 6.0 05/07/2018 1106   GLUCOSEU NEGATIVE 05/07/2018 1106   [place] 05/07/2018 1106   [place] 05/07/2018 1106   [place] 05/07/2018 1106   PROTEINUR NEGATIVE 05/07/2018 1106   UROBILINOGEN 1.0 09/16/2007 1212   NITRITE NEGATIVE 05/07/2018 1106   LEUKOCYTESUR TRACE (A) 05/07/2018 1106    Radiological Exams on Admission: Dg Knee Right Port  Result Date: 05/09/2018 CLINICAL DATA:  Apparent cellulitis at the previous BKA site EXAM: PORTABLE RIGHT KNEE - 1-2 VIEW COMPARISON:  03/26/2018, 04/18/2017 FINDINGS: Changes consistent with below the knee amputation are noted. Knee prosthesis is again noted and stable. Mild irregularity is noted laterally at the amputation site which may represent some local cellulitis. No definitive bony erosion is seen. IMPRESSION: Changes consistent with prior BKA and right knee replacement. Soft tissue changes are noted as described without bony erosion. Electronically Signed   By: Inez Catalina M.D.   On: 05/09/2018 14:50    EKG: Independently reviewed.  Atrial fibrillation, 90 bpm, normal axis, low voltage, poor R progression.  Assessment/Plan Active Problems:   Wound infection  85 year old male with multiple medical problems including hypertension, atrial fibrillation and chronic kidney disease who presents with right BKA stump erythematous rash, with positive pain and local drainage.  Apparently he has been febrile.  On his initial physical examination temperature 98.8, blood pressure 125/72, heart rate 88, respiratory rate 18, oxygen saturation 97%.  His lungs are clear to auscultation bilaterally, heart S1-S2 present with me, abdomen protuberant, right lower extremity BKA stump with local erythematous rash, no frank purulence, no increased local temperature or palpable fluctuance.  Sodium 140, potassium 5.4, chloride 107 bicarb 24, glucose 115, BUN 25, creatinine 1.51,  white count 8.0, hemoglobin 9.5, hematocrit 31.7, platelets 224.  Urinalysis negative for infection.  Right knee x-ray with mild irregularity noted laterally at the amputation site which may represent local cellulitis.  Patient will be admitted to the hospital with a working diagnosis of right BKA stump cellulitis.  1.  Right BKA stump cellulitis/nonpurulent.  Present on admission.  Patient will be admitted to the medical ward, he will get gentle IV fluids, broad-spectrum antibiotic therapy with IV Vancomycin and  IV Zoysin for now. Follow-up on cell count, cultures and temperature curve.  Consultation with Dr. [name].  2.  Stage III chronic kidney disease. Stable renal function with serum cr at 1,51 with K at 5,4 and serum bicarbonate at 25. Will follow on renal panel in am, avoid hypotension and nephrotoxic medications.   3.  Chronic atrial fibrillation.  Currently rate controlled, continue diltiazem 300 mg daily and metoprolol 25 mg twice daily..  Anticoagulation with aspirin 325 mg daily.  4.  Hypothyroidism.  Continue levothyroxine.  5.  Seizures.  Patient will continue Depakote twice daily.  6.  Dyslipidemia.  Continue Zetia and pravastatin.  7.  Patient is chronically on steroids: prednisone 5 mg daily.  DVT prophylaxis:  Enoxaparin  Code Status: full  Family Communication: I spoke with patient's wife at the bedside and all questions were addressed.    Disposition Plan: Med-Surg  Consults called: Dr. [name]  Admission status: Inpatient.      Gerome Apley MD [facility]   05/09/2018, 4:04 PM

## 2018-05-09 NOTE — ED Notes (Addendum)
Critical lab, lactid acid 2.1

## 2018-05-09 NOTE — ED Triage Notes (Signed)
Pt arrives for wound check of BKA of right leg that was done in January- pt has redness and pain.

## 2018-05-09 NOTE — Progress Notes (Addendum)
Pharmacy Antibiotic Note  [name] is a 85 y.o. male admitted on 05/09/2018 with cellulitis, stump infection. Also with ceftriaxone ordered per EDP. Pharmacy has been consulted for vancomcyin dosing. Hx R BKA in 03/2018. SCr 1.51 on admit.  Plan: Ceftriaxone 2g IV q24h per EDP Vancomycin 1750mg  IV x 1; then Vancomycin 1000 mg IV Q 24 hrs. Goal AUC 400-550. Expected AUC: 536 SCr used: 1.51 Monitor clinical progress, c/s, renal function F/u de-escalation plan/LOT     Temp (24hrs), Avg:98.8 F (37.1 C), Min:98.8 F (37.1 C), Max:98.8 F (37.1 C)  Recent Labs  Lab 05/07/18 1106 05/07/18 1240 05/09/18 1218 05/09/18 1230  WBC 7.4  --   --   --   CREATININE 1.33*  --  1.51*  --   LATICACIDVEN  --  1.4  --  2.1*    Estimated Creatinine Clearance: 37.7 mL/min (A) (by C-G formula based on SCr of 1.51 mg/dL (H)).    Allergies  Allergen Reactions  . Eliquis [Apixaban] Other (See Comments)    bleeding  . Demerol [Meperidine] Nausea And Vomiting  . Keppra [Levetiracetam] Other (See Comments)    Causes sleepiness, mental status changes   Elicia Lamp, PharmD, BCPS Please check AMION for all [place] contact numbers Clinical Pharmacist 05/09/2018 2:13 PM     Addendum: Admitting MD requesting switch from ceftriaxone to Zosyn Start Zosyn 3/375g IV every 8 hours (4h infusion) Continue vanc as above  Bertis Ruddy, PharmD Clinical Pharmacist Please check AMION for all [place] numbers 05/09/2018 5:26 PM

## 2018-05-09 NOTE — ED Provider Notes (Signed)
[facility] EMERGENCY DEPARTMENT Provider Note   CSN: [id number] Arrival date & time: 05/09/18  1215    History   Chief Complaint Chief Complaint  Patient presents with  . Post-op Problem    HPI [name] is a 85 y.o. male.     HPI  85 year old male presents with right knee redness and concern for infection.  History is mostly taken from the wife.  Patient had a recent BKA last month.  The patient has not had any fevers but when the dressing was changed today it appeared red.  Wife has noticed some drainage over the last day or so in the covering of his knee.  She states the color is tan.  He is endorsing some pain at his stump site.  He had a cough last week but no cough today, chest pain, shortness of breath.  Operation was performed by Dr. [name].  Past Medical History:  Diagnosis Date  . Abnormality of gait 09/22/2015  . Arthritis   . Atrial fibrillation ([place])   . CAD (coronary artery disease)    Stent to RCA, Penta stent, 99% reduced to 0% 2002.  . Cancer ([place])    skin CA removed from back  . Chronic insomnia 03/30/2015  . Complication of anesthesia    trouble waking up  . GERD (gastroesophageal reflux disease)   . Hypercholesteremia   . Hypertension   . Hypothyroidism   . Memory difficulty 09/22/2015  . Osteoarthritis   . Seizures ([place])   . Sepsis ([place]) 05/2017  . Transient alteration of awareness 03/30/2015  . Vertigo    hx of    Patient Active Problem List   Diagnosis Date Noted  . AKI (acute kidney injury) ([place])   . S/P BKA (below knee amputation) unilateral, right ([place])   . Unilateral complete BKA, right, initial encounter ([place])   . Tachypnea   . Post-operative pain   . Subacute osteomyelitis, right ankle and foot ([place])   . 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 TIA (transient ischemic attack) 03/24/2018  . Encephalopathy 03/24/2018  . Cellulitis 03/15/2018  . Hypernatremia 03/15/2018  . Persistent atrial fibrillation  11/10/2017  . Severe muscle deconditioning 11/10/2017  . Hemorrhage 10/22/2017  . CAD S/P percutaneous coronary angioplasty 10/22/2017  . Acute hypoxemic respiratory failure ([place]) 07/17/2017  . Aspiration syndrome, subsequent encounter 07/11/2017  . Aspiration pneumonia ([place]) 07/01/2017  . Acute encephalopathy 07/01/2017  . Thrombocytopenia ([place]) 07/01/2017  . Chronic diastolic (congestive) heart failure ([place]) 07/01/2017  . Anemia of chronic disease 07/01/2017  . Troponin level elevated 07/01/2017  . Chronic atrial fibrillation 07/01/2017  . Goals of care, counseling/discussion   . Palliative care by specialist   . Acute metabolic encephalopathy 71/08/2692  . CKD (chronic kidney disease) stage 3, GFR 30-59 ml/min (HCC)   . CAP (community acquired pneumonia) 04/17/2017  . Hallux rigidus, right foot 03/10/2016  . Lumbosacral spondylosis without myelopathy 10/29/2015  . Memory difficulty 09/22/2015  . Abnormality of gait 09/22/2015  . Hyperglycemia 07/06/2015  . Chronic pain 07/06/2015  . Chronic insomnia 03/30/2015  . Transient alteration of awareness 03/30/2015  . Abnormal liver function   . Altered mental status 01/31/2015  . Essential hypertension 01/31/2015  . Constipation 01/31/2015  . Hypothyroidism 01/31/2015  . Seizure disorder ([place]) 01/31/2015  . Bladder outlet obstruction 01/31/2015  . GERD (gastroesophageal reflux disease) 01/31/2015  . Chronic back pain 01/31/2015  . Acute kidney injury ([place]) 01/31/2015    Past  Surgical History:  Procedure Laterality Date  . AMPUTATION Right 03/28/2018   Procedure: AMPUTATION BELOW KNEE;  Surgeon: Newt Minion, MD;  Location: [facility];  Service: Orthopedics;  Laterality: Right;  . BACK SURGERY    . EYE SURGERY     Bilateral Cataract surgery   . HERNIA REPAIR    . I&D EXTREMITY Right 05/10/2015   Procedure: IRRIGATION AND DEBRIDEMENT EXTREMITY;  Surgeon: Leanora Cover, MD;  Location: [facility];  Service: Orthopedics;   Laterality: Right;  . KNEE ARTHROPLASTY     right knee X 2; left knee once  . LAMINECTOMY     X 6  . POSTERIOR CERVICAL FUSION/FORAMINOTOMY  01/28/2012   Procedure: POSTERIOR CERVICAL FUSION/FORAMINOTOMY LEVEL 3;  Surgeon: [name], MD;  Location: [facility];  Service: Neurosurgery;  Laterality: Left;  Posterior Cervical Five-Thoracic One Fusion, Arthrodesis with LEFT Cervical Seven-thoracic One Laminectomy, Foraminotomy and Resection of Synovial Cyst  . POSTERIOR CERVICAL FUSION/FORAMINOTOMY N/A 01/29/2013   Procedure: POSTERIOR CERVICAL FUSION/FORAMINOTOMY LEVEL 1 and C2-5 Posteriolateral Arthrodesis;  Surgeon: [name], MD;  Location: [facility];  Service: Neurosurgery;  Laterality: N/A;  C2-C3 Laminectomy C2-C3 posterior cervical arthrodesis  . TONSILLECTOMY          Home Medications    Prior to Admission medications   Medication Sig Start Date End Date Taking? Authorizing Provider  acetaminophen (TYLENOL) 325 MG tablet Take 2 tablets (650 mg total) by mouth every 6 (six) hours as needed for mild pain (pain score 1-3 or temp > 100.5). Patient not taking: Reported on 05/07/2018 04/02/18   Debbe Odea, MD  alendronate (FOSAMAX) 70 MG tablet Take 70 mg by mouth once a week. On Wednesday. Remain upright for 30-60 minutes. 01/06/12   [provider]  Amino Acids-Protein Hydrolys (FEEDING SUPPLEMENT, PRO-STAT SUGAR FREE 64,) LIQD Take 30 mLs by mouth 2 (two) times daily. 07/05/17   Lavina Hamman, MD  aspirin EC 325 MG EC tablet Take 1 tablet (325 mg total) by mouth daily. 04/03/18   Debbe Odea, MD  bisacodyl (DULCOLAX) 10 MG suppository Place 1 suppository (10 mg total) rectally daily as needed for moderate constipation. 04/02/18   Debbe Odea, MD  calcium-vitamin D (OSCAL WITH D) 500-200 MG-UNIT tablet Take 1 tablet by mouth daily with breakfast. 500 mg (1250 mg)-200    [provider]  cholecalciferol (VITAMIN D) 25 MCG (1000 UT) tablet Take  1,000 Units by mouth daily.    [provider]  cyanocobalamin 500 MCG tablet Take 500 mcg by mouth at bedtime.    [provider]  diclofenac sodium (VOLTAREN) 1 % GEL Apply 2 g topically daily as needed (pain).    [provider]  diltiazem (CARDIZEM CD) 300 MG 24 hr capsule TAKE 1 CAPSULE(300 MG) BY MOUTH EVERY MORNING. DO NOT CRUSH Patient taking differently: Take 300 mg by mouth daily. For HTN hold med for SBP less than 110 and DBP less than 60 12/03/17   Croitoru, Mihai, MD  divalproex (DEPAKOTE) 250 MG DR tablet Take 250-500 mg by mouth 2 (two) times daily. Takes 250 mg tablet in the morning and 500 mg  in the evening.    [provider]  docusate sodium (COLACE) 100 MG capsule Take 1 capsule (100 mg total) by mouth 2 (two) times daily. 04/02/18   Debbe Odea, MD  doxazosin (CARDURA) 4 MG tablet Take 2 mg by mouth every evening. Hold med for SBP less than 110  And DBP less than 60    [provider]  feeding supplement, ENSURE ENLIVE, (ENSURE ENLIVE) LIQD Take 237 mLs by mouth daily. Patient not taking: Reported on 05/07/2018 04/02/18   Debbe Odea, MD  ferrous sulfate 325 (65 FE) MG tablet Take 1 tablet (325 mg total) by mouth 2 (two) times daily with a meal. 06/28/17   Skeet Latch, MD  fluticasone [facility]) 50 MCG/ACT nasal spray Place 2 sprays into both nostrils daily as needed for allergies or rhinitis.    [provider]  furosemide (LASIX) 40 MG tablet Take 0.5 tablets (20 mg total) by mouth every morning. Or per cardiologist instructions since patient cannot be weighed Patient taking differently: Take 20 mg by mouth every morning.  04/02/18   Debbe Odea, MD  HYDROcodone-acetaminophen (NORCO) 10-325 MG tablet Take 1 tablet by mouth every 4 (four) hours as needed.    [provider]  ipratropium-albuterol (DUONEB) 0.5-2.5 (3) MG/3ML SOLN Take 3 mLs by nebulization every 4 (four) hours as needed.    [provider]  lactobacillus acidophilus & bulgar (LACTINEX) chewable tablet Chew 1 tablet by mouth 3 (three) times daily with meals. 03/19/18   Roxan Hockey, MD  levothyroxine (SYNTHROID, LEVOTHROID) 150 MCG tablet Take 150 mcg by mouth daily before breakfast. 12/27/11   [provider]  Lidocaine (ASPERCREME LIDOCAINE) 4 % PTCH Apply 1 patch topically 2 (two) times daily.     [provider]  lidocaine (LIDODERM) 5 % Place 1 patch onto the skin daily as needed. Remove & Discard patch within 12 hours or as directed by MD Patient not taking: Reported on 05/07/2018 06/01/17   Cristal Ford, DO  magnesium citrate SOLN Take 296 mLs (1 Bottle total) by mouth once as needed for severe constipation. 04/02/18   Debbe Odea, MD  Menthol-Methyl Salicylate (MUSCLE RUB EX) Apply 1 application topically daily as needed (shoulder pain).    [provider]  methocarbamol (ROBAXIN) 500 MG tablet Take 1 tablet (500 mg total) by mouth every 6 (six) hours as needed for muscle spasms. 04/02/18   Debbe Odea, MD  metoprolol tartrate (LOPRESSOR) 25 MG tablet Take 1 tablet (25 mg total) by mouth 2 (two) times daily. Take with or immediately following a meal. 11/06/17   Croitoru, Mihai, MD  Multiple Vitamins-Minerals (CERTAVITE/ANTIOXIDANTS) TABS Take 1 tablet by mouth every evening. 18-400 mg    [provider]  nitroGLYCERIN (NITROSTAT) 0.4 MG SL tablet Place 0.4 mg under the tongue every 5 (five) minutes as needed for chest pain. Max of 3 tablets    [provider]  omeprazole (PRILOSEC) 20 MG capsule Take 20 mg by mouth daily before breakfast.  12/23/11   [provider]  oseltamivir (TAMIFLU) 30 MG capsule Take 30 mg by mouth daily.    [provider]  oxycodone (OXY-IR) 5 MG capsule Take 1 capsule (5 mg total) by mouth every 4 (four) hours as needed. Patient not taking: Reported on 05/07/2018 04/07/18   Rayburn, Neta Mends, PA-C  polyethylene glycol (MIRALAX  / GLYCOLAX) packet Take 17 g by mouth daily as needed for mild constipation. Patient not taking: Reported on 05/07/2018 04/02/18   Debbe Odea, MD  polyethylene glycol powder (GLYCOLAX/MIRALAX) powder Take 1 Container by mouth as needed for moderate constipation.  03/03/18   [provider]  potassium chloride SA (K-DUR,KLOR-CON) 20 MEQ tablet TAKE 1 TABLET BY MOUTH DAILY ALONG WITH LASIX 40MG  OR AS DIRECTED BY CARDIOLOGIST Patient taking differently: Take 20  mEq by mouth daily.  03/11/18   Croitoru, Mihai, MD  pravastatin (PRAVACHOL) 40 MG tablet Take 40 mg by mouth every evening.  10/29/14   [provider]  predniSONE (DELTASONE) 5 MG tablet Take 2 tablets (10 mg total) by mouth daily with breakfast. Patient taking differently: Take 5 mg by mouth daily with breakfast.  04/02/18   Debbe Odea, MD  sodium chloride (OCEAN) 0.65 % SOLN nasal spray Place 1 spray into both nostrils as needed for congestion.    [provider]  ZETIA 10 MG tablet Take 5 mg by mouth at bedtime. (0.5 tablet) 10/15/11   [provider]    Family History Family History  Problem Relation Age of Onset  . Hypertension Mother   . Cancer Mother   . Kidney failure Father   . Heart disease Father     Social History Social History   Tobacco Use  . Smoking status: Former Research scientist (life sciences)  . Smokeless tobacco: Never Used  Substance Use Topics  . Alcohol use: Yes    Comment: rare  . Drug use: No     Allergies   Eliquis [apixaban]; Demerol [meperidine]; and Keppra [levetiracetam]   Review of Systems Review of Systems  Constitutional: Negative for fever.  Respiratory: Negative for cough and shortness of breath.   Musculoskeletal: Positive for myalgias.  Skin: Positive for color change and wound.  All other systems reviewed and are negative.    Physical Exam Updated Vital Signs BP 127/72 (BP Location: Right Arm)   Pulse 88   Temp 98.8 F (37.1 C) (Oral)   Resp 18   SpO2 97%    Physical Exam Vitals signs and nursing note reviewed.  Constitutional:      General: He is not in acute distress.    Appearance: He is well-developed. He is not ill-appearing or diaphoretic.  HENT:     Head: Normocephalic and atraumatic.     Right Ear: External ear normal.     Left Ear: External ear normal.     Nose: Nose normal.  Eyes:     General:        Right eye: No discharge.        Left eye: No discharge.  Neck:     Musculoskeletal: Neck supple.  Cardiovascular:     Rate and Rhythm: Normal rate and regular rhythm.     Heart sounds: Normal heart sounds.  Pulmonary:     Effort: Pulmonary effort is normal.     Breath sounds: Normal breath sounds.  Abdominal:     General: There is no distension.     Palpations: Abdomen is soft.     Tenderness: There is no abdominal tenderness.  Skin:    General: Skin is warm and dry.  Neurological:     Mental Status: He is alert.  Psychiatric:        Mood and Affect: Mood is not anxious.        ED Treatments / Results  Labs (all labs ordered are listed, but only abnormal results are displayed) Labs Reviewed  LACTIC ACID, PLASMA - Abnormal; Notable for the following components:      Result Value   Lactic Acid, Venous 2.1 (*)    All other components within normal limits  COMPREHENSIVE METABOLIC PANEL - Abnormal; Notable for the following components:   Potassium 5.4 (*)    Glucose, Bld 115 (*)    BUN 25 (*)    Creatinine, Ser 1.51 (*)  Calcium 8.8 (*)    Total Protein 5.7 (*)    Albumin 3.0 (*)    GFR calc non Af Amer 42 (*)    GFR calc Af Amer 49 (*)    All other components within normal limits  CBC WITH DIFFERENTIAL/PLATELET - Abnormal; Notable for the following components:   RBC 3.20 (*)    Hemoglobin 9.5 (*)    HCT 31.7 (*)    Lymphs Abs 0.6 (*)    Abs Immature Granulocytes 0.20 (*)    All other components within normal limits  CULTURE, BLOOD (ROUTINE X 2)  CULTURE, BLOOD (ROUTINE X 2)  CULTURE, BLOOD  (ROUTINE X 2)  CULTURE, BLOOD (ROUTINE X 2)  LACTIC ACID, PLASMA  CBC WITH DIFFERENTIAL/PLATELET    EKG EKG Interpretation  Date/Time:  Friday May 09 2018 15:32:21 EST Ventricular Rate:  90 PR Interval:    QRS Duration: 92 QT Interval:  393 QTC Calculation: 481 R Axis:   50 Text Interpretation:  likely afib, artifact interferes with interpretation Low voltage, extremity leads Borderline prolonged QT interval Confirmed by Sherwood Gambler [phone]) on 05/09/2018 4:11:53 PM   Radiology Dg Knee Right Port  Result Date: 05/09/2018 CLINICAL DATA:  Apparent cellulitis at the previous BKA site EXAM: PORTABLE RIGHT KNEE - 1-2 VIEW COMPARISON:  03/26/2018, 04/18/2017 FINDINGS: Changes consistent with below the knee amputation are noted. Knee prosthesis is again noted and stable. Mild irregularity is noted laterally at the amputation site which may represent some local cellulitis. No definitive bony erosion is seen. IMPRESSION: Changes consistent with prior BKA and right knee replacement. Soft tissue changes are noted as described without bony erosion. Electronically Signed   By: Inez Catalina M.D.   On: 05/09/2018 14:50    Procedures .Critical Care Performed by: Sherwood Gambler, MD Authorized by: Sherwood Gambler, MD   Critical care provider statement:    Critical care time (minutes):  30   Critical care time was exclusive of:  Separately billable procedures and treating other patients   Critical care was necessary to treat or prevent imminent or life-threatening deterioration of the following conditions:  Sepsis   Critical care was time spent personally by me on the following activities:  Development of treatment plan with patient or surrogate, discussions with consultants, evaluation of patient's response to treatment, examination of patient, obtaining history from patient or surrogate, ordering and performing treatments and interventions, ordering and review of laboratory studies, ordering  and review of radiographic studies, pulse oximetry, re-evaluation of patient's condition and review of old charts   (including critical care time)  Medications Ordered in ED Medications  cefTRIAXone (ROCEPHIN) 2 g in sodium chloride 0.9 % 100 mL IVPB (2 g Intravenous New Bag/Given 05/09/18 1528)  vancomycin (VANCOCIN) 1,750 mg in sodium chloride 0.9 % 500 mL IVPB (has no administration in time range)  vancomycin (VANCOCIN) IVPB 1000 mg/200 mL premix (has no administration in time range)  sodium chloride flush (NS) 0.9 % injection 3 mL (3 mLs Intravenous Given 05/09/18 1415)     Initial Impression / Assessment and Plan / ED Course  I have reviewed the triage vital signs and the nursing notes.  Pertinent labs & imaging results that were available during my care of the patient were reviewed by me and considered in my medical decision making (see chart for details).        Patient will be treated for wound infection.  Externally it is not impressive but with his mild lactic acidosis and history  of previous infections, I think is reasonable to admit, get blood cultures, and give IV antibiotics.  I discussed with Dr. [name], who will see the patient in the morning.  Hospitalist will admit.  Final Clinical Impressions(s) / ED Diagnoses   Final diagnoses:  Right BKA infection [facility])    ED Discharge Orders    None       Sherwood Gambler, MD 05/09/18 [phone]

## 2018-05-09 NOTE — Telephone Encounter (Signed)
[facility] called reporting pt with redness an drainage and wanted to be seen earlier than appt scheduled for 3/11. I informed them that they can come in today. She stated they would be in as soon as they could or they would go to ER.

## 2018-05-10 DIAGNOSIS — G40909 Epilepsy, unspecified, not intractable, without status epilepticus: Secondary | ICD-10-CM

## 2018-05-10 LAB — CBC
HCT: 28.7 % — ABNORMAL LOW (ref 39.0–52.0)
Hemoglobin: 8.8 g/dL — ABNORMAL LOW (ref 13.0–17.0)
MCH: 29.8 pg (ref 26.0–34.0)
MCHC: 30.7 g/dL (ref 30.0–36.0)
MCV: 97.3 fL (ref 80.0–100.0)
Platelets: 210 10*3/uL (ref 150–400)
RBC: 2.95 MIL/uL — ABNORMAL LOW (ref 4.22–5.81)
RDW: 14.3 % (ref 11.5–15.5)
WBC: 6.4 10*3/uL (ref 4.0–10.5)
nRBC: 0 % (ref 0.0–0.2)

## 2018-05-10 LAB — BASIC METABOLIC PANEL
Anion gap: 8 (ref 5–15)
BUN: 24 mg/dL — ABNORMAL HIGH (ref 8–23)
CO2: 25 mmol/L (ref 22–32)
Calcium: 8.2 mg/dL — ABNORMAL LOW (ref 8.9–10.3)
Chloride: 106 mmol/L (ref 98–111)
Creatinine, Ser: 1.27 mg/dL — ABNORMAL HIGH (ref 0.61–1.24)
GFR calc Af Amer: >60 mL/min (ref >60)
GFR calc non Af Amer: 52 mL/min — ABNORMAL LOW (ref >60)
Glucose, Bld: 83 mg/dL (ref 70–99)
Potassium: 3.8 mmol/L (ref 3.5–5.1)
Sodium: 139 mmol/L (ref 135–145)

## 2018-05-10 LAB — MRSA PCR SCREENING: MRSA by PCR: NEGATIVE

## 2018-05-10 MED ORDER — ENSURE ENLIVE PO LIQD
237.0000 mL | Freq: Two times a day (BID) | ORAL | Status: DC
  Administered 2018-05-11 – 2018-05-12 (×2): 237 mL via ORAL

## 2018-05-10 MED ORDER — SENNOSIDES-DOCUSATE SODIUM 8.6-50 MG PO TABS
1.0000 | ORAL_TABLET | Freq: Two times a day (BID) | ORAL | Status: DC
  Administered 2018-05-10 – 2018-05-12 (×3): 1 via ORAL
  Filled 2018-05-10 (×3): qty 1

## 2018-05-10 NOTE — Progress Notes (Signed)
Patient ID: [name], male   DOB: 06-Jan-1934, 85 y.o.   MRN: [id number] Patient is seen in follow-up status post right transtibial amputation.  I had a long discussion with the patient and his wife discussed the importance of nutrition intake patient is currently not eating discussed the importance of protein supplements twice a day as well is eating.  Recommend that he wear the black stump shrinker around the clock.  Feel patient should benefit from IV antibiotics over the weekend with discharge back to [facility] on Monday.  Per patient's wife the cellulitis is resolving with the IV antibiotics.  There is no tenderness to palpation no signs of an abscess no purulent drainage.

## 2018-05-10 NOTE — Progress Notes (Signed)
Initial Nutrition Assessment  DOCUMENTATION CODES:   Severe malnutrition in context of acute illness/injury  INTERVENTION:   Ensure Enlive po BID, each supplement provides 350 kcal and 20 grams of protein  30 ml Prostat BID, each supplement provides 100 kcals and 15 grams protein.   Continue MVI with Minerals  Double Protein on meal trays  NUTRITION DIAGNOSIS:   Severe Malnutrition related to acute illness as evidenced by moderate muscle depletion, severe muscle depletion, moderate fat depletion.  GOAL:   Patient will meet greater than or equal to 90% of their needs  MONITOR:   PO intake, Supplement acceptance, Labs, Weight trends  REASON FOR ASSESSMENT:   Malnutrition Screening Tool    ASSESSMENT:   85 yo male with hx of right foot and ankle ostemyelitis s/p BKA 03/2018 and now amitted with right BKA stump cellulitis/nonpurulent drainage. PMH include sHTN, seizures, GERD, CKD III, CAD   Recorded po intake 100% of breakfast this AM. On visit today, pt had all of late lunch tray but shared with his wife. Pt has been at  [facility] since discharge In [place]. Pt has poor appetite and does not like food at [facility]. Pt has been eating only 1/4 of meal trays on average, has been receiving Pro-Stat and MedPass but has not been taking. Per wife, MD [name] had long conversation about importance of nutrition and protein with regards to wound healing. 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 Pt is now agreeable to take ONS, protein modulars. Plan for Pro-Stat and Ensure while in-patient.   Pt with chronic wounds and had been taking Pro-Stat BID at home even before his amputation. Wife reports she had been caring for his wounds for at least 1 year.   Per wife, pt has lost significant amount of weight. Current wt 70.3 kg; weight 1 month ago was 75 kg. 6.7% wt loss. Wife believe pt has lost 20 pounds over the past year. 11.5% wt loss  in 1 year  Pt has not had the strength recently to work well with PT at [facility]: Creatinine 1.27, BUN 24, no CBGs, serum glucose wdl Meds: NS at 50 ml/hr, Oscal with D, cholecalciferol, ferrous sulfate, Lactinex with meals, MVI with minerals, prednisone, B-12   NUTRITION - FOCUSED PHYSICAL EXAM:    Most Recent Value  Orbital Region  Mild depletion  Upper Arm Region  Severe depletion  Thoracic and Lumbar Region  Moderate depletion  Buccal Region  Mild depletion  Temple Region  Moderate depletion  Clavicle Bone Region  Severe depletion  Clavicle and Acromion Bone Region  Severe depletion  Scapular Bone Region  Severe depletion  Dorsal Hand  Severe depletion  Patellar Region  Mild depletion  Anterior Thigh Region  Moderate depletion  Posterior Calf Region  Mild depletion  Edema (RD Assessment)  None       Diet Order:   Diet Order            Diet Heart Room service appropriate? Yes; Fluid consistency: Thin  Diet effective now              EDUCATION NEEDS:   Education needs have been addressed  Skin:  Skin Assessment: Skin Integrity Issues: Skin Integrity Issues:: Other (Comment) Other: BKA stump cellulitis with nonpurulent drainage  Last BM:  2/28  Height:   Ht Readings from Last 1 Encounters:  05/09/18 5\' 9"  (1.753 m)    Weight:  Wt Readings from Last 1 Encounters:  05/09/18 70.3 kg    Ideal Body Weight:  68 kg (BKA)  BMI:  Body mass index is 22.89 kg/m.  Estimated Nutritional Needs:   Kcal:  2000-2200 kcals   Protein:  100-120 g  Fluid:  >/= 2 L    Kerman Passey MS, RD, LDN, CNSC [phone] Pager  [phone] Weekend/On-Call Pager

## 2018-05-10 NOTE — Progress Notes (Signed)
Patient ID: [name], male   DOB: [birth year], 85 y.o.   MRN: [id number]  PROGRESS NOTE    [name]  NID:[id number] DOB: Mar 21, 1933 DOA: 05/09/2018 PCP: [name], MD   Brief Narrative:  85 year old male with history of right lower extremity osteomyelitis status post BKA on 03/28/2018, atrial fibrillation, hypertension, seizures, GERD, stage III chronic any disease, coronary artery disease presented with right BKA stump erythema and pain.  He was started on IV antibiotics.  Orthopedic/Dr. [name] was consulted.  Assessment & Plan:   Active Problems:   Seizure disorder (HCC)   CKD (chronic kidney disease) stage 3, GFR 30-59 ml/min (HCC)   Anemia of chronic disease   Chronic atrial fibrillation   Wound infection  Right BKA stump cellulitis present on admission -Continue broad-spectrum antibiotics for now.  Orthopedics evaluation with Dr. [name] has pending.  Follow cultures.  Pain management.  Currently afebrile with no leukocytosis  Chronic kidney disease stage III -Renal function stable.  Monitor  Chronic atrial fibrillation -Currently rate controlled.  Continue diltiazem and metoprolol.  Will hold aspirin for now in case patient needs any surgical intervention.  Hypothyroidism Continue levothyroxine  Seizures Continue Depakote  Dyslipidemia--continue Zetia and pravastatin  Patient is on chronic steroids prednisone 5 mg daily    DVT prophylaxis: Lovenox Code Status: Full Family Communication: Wife at bedside Disposition Plan: SNF once cleared by orthopedics  Consultants: Orthopedics  Procedures: None  Antimicrobials: Vancomycin and Zosyn from 05/09/2018 onwards   Subjective: Patient seen and examined at bedside.  Complains of back pain and stomach pain.  No overnight fever or vomiting.  Poor historian.  Wife at bedside.  Objective: Vitals:   05/09/18 1845 05/09/18 2029 05/09/18 2200 05/10/18 0520  BP: 118/66 136/80  100/60  Pulse: 83 92  82  Resp:  14 16  16   Temp:  98.4 F (36.9 C)  98.6 F (37 C)  TempSrc:  Oral  Oral  SpO2:  100%  92%  Weight:   70.3 kg   Height:   5\' 9"  (1.753 m)     Intake/Output Summary (Last 24 hours) at 05/10/2018 1118 Last data filed at 05/10/2018 0900 Gross per 24 hour  Intake 472.61 ml  Output -  Net 472.61 ml   Filed Weights   05/09/18 2200  Weight: 70.3 kg    Examination:  General exam: Appears calm and comfortable.  Elderly male lying in bed. Respiratory system: Bilateral decreased breath sounds at bases, no wheezing Cardiovascular system: S1 & S2 heard, Rate controlled Gastrointestinal system: Abdomen is nondistended, soft and nontender. Normal bowel sounds heard. Extremities: No cyanosis, clubbing; right BKA with stump erythema and warmth without any discharge.  Data Reviewed: I have personally reviewed following labs and imaging studies  CBC: Recent Labs  Lab 05/07/18 1106 05/09/18 1517 05/10/18 0431  WBC 7.4 8.0 6.4  NEUTROABS 4.9 6.4  --   HGB 8.6* 9.5* 8.8*  HCT 28.4* 31.7* 28.7*  MCV 101.1* 99.1 97.3  PLT 206 224 144   Basic Metabolic Panel: Recent Labs  Lab 05/07/18 1106 05/09/18 1218 05/10/18 0431  NA 138 140 139  K 4.2 5.4* 3.8  CL 106 107 106  CO2 23 24 25   GLUCOSE 100* 115* 83  BUN 26* 25* 24*  CREATININE 1.33* 1.51* 1.27*  CALCIUM 8.2* 8.8* 8.2*   GFR: Estimated Creatinine Clearance: 44.6 mL/min (A) (by C-G formula based on SCr of 1.27 mg/dL (H)). Liver Function Tests: Recent Labs  Lab 05/09/18 1218  AST 38  ALT 43  ALKPHOS 56  BILITOT 0.5  PROT 5.7*  ALBUMIN 3.0*   No results for input(s): LIPASE, AMYLASE in the last 168 hours. No results for input(s): AMMONIA in the last 168 hours. Coagulation Profile: No results for input(s): INR, PROTIME in the last 168 hours. Cardiac Enzymes: No results for input(s): CKTOTAL, CKMB, CKMBINDEX, TROPONINI in the last 168 hours. BNP (last 3 results) Recent Labs    10/22/17 1120 11/22/17 1130  04/23/18 1355  PROBNP 3,243* 3,962* 3,612*   HbA1C: No results for input(s): HGBA1C in the last 72 hours. CBG: No results for input(s): GLUCAP in the last 168 hours. Lipid Profile: No results for input(s): CHOL, HDL, LDLCALC, TRIG, CHOLHDL, LDLDIRECT in the last 72 hours. Thyroid Function Tests: No results for input(s): TSH, T4TOTAL, FREET4, T3FREE, THYROIDAB in the last 72 hours. Anemia Panel: No results for input(s): VITAMINB12, FOLATE, FERRITIN, TIBC, IRON, RETICCTPCT in the last 72 hours. Sepsis Labs: Recent Labs  Lab 05/07/18 1240 05/09/18 1230 05/09/18 1517  LATICACIDVEN 1.4 2.1* 1.4    Recent Results (from the past 240 hour(s))  MRSA PCR Screening     Status: None   Collection Time: 05/09/18  9:57 PM  Result Value Ref Range Status   MRSA by PCR NEGATIVE NEGATIVE Final    Comment:        The GeneXpert MRSA Assay (FDA approved for NASAL specimens only), is one component of a comprehensive MRSA colonization surveillance program. It is not intended to diagnose MRSA infection nor to guide or monitor treatment for MRSA infections. Performed at [facility], [place] [address], [place], [place] [address]          Radiology Studies: Dg Knee Right Port  Result Date: 05/09/2018 CLINICAL DATA:  Apparent cellulitis at the previous BKA site EXAM: PORTABLE RIGHT KNEE - 1-2 VIEW COMPARISON:  03/26/2018, 04/18/2017 FINDINGS: Changes consistent with below the knee amputation are noted. Knee prosthesis is again noted and stable. Mild irregularity is noted laterally at the amputation site which may represent some local cellulitis. No definitive bony erosion is seen. IMPRESSION: Changes consistent with prior BKA and right knee replacement. Soft tissue changes are noted as described without bony erosion. Electronically Signed   By: Inez Catalina M.D.   On: 05/09/2018 14:50        Scheduled Meds: . aspirin  325 mg Oral Daily  . calcium-vitamin D  1 tablet Oral Q breakfast   . cholecalciferol  1,000 Units Oral Daily  . diltiazem  300 mg Oral Daily  . divalproex  250 mg Oral Daily   And  . divalproex  500 mg Oral QHS  . docusate sodium  100 mg Oral BID  . doxazosin  2 mg Oral QPM  . enoxaparin (LOVENOX) injection  40 mg Subcutaneous Q24H  . ezetimibe  5 mg Oral QHS  . feeding supplement (PRO-STAT SUGAR FREE 64)  30 mL Oral BID  . fentaNYL (SUBLIMAZE) injection  50 mcg Intravenous Once  . ferrous sulfate  325 mg Oral BID WC  . lactobacillus acidophilus & bulgar  1 tablet Oral TID WC  . levothyroxine  150 mcg Oral Q0600  . metoprolol tartrate  25 mg Oral BID  . multivitamin with minerals  1 tablet Oral QPM  . pantoprazole  40 mg Oral Daily  . pravastatin  40 mg Oral QPM  . predniSONE  5 mg Oral Q breakfast  . vitamin B-12  500 mcg Oral QHS  Continuous Infusions: . sodium chloride 50 mL/hr at 05/10/18 0635  . piperacillin-tazobactam (ZOSYN)  IV 3.375 g (05/10/18 0651)  . vancomycin       LOS: 1 day        Aline August, MD [facility] 05/10/2018, 11:18 AM

## 2018-05-11 DIAGNOSIS — D638 Anemia in other chronic diseases classified elsewhere: Secondary | ICD-10-CM

## 2018-05-11 LAB — BASIC METABOLIC PANEL
Anion gap: 9 (ref 5–15)
BUN: 33 mg/dL — ABNORMAL HIGH (ref 8–23)
CO2: 23 mmol/L (ref 22–32)
Calcium: 8.2 mg/dL — ABNORMAL LOW (ref 8.9–10.3)
Chloride: 107 mmol/L (ref 98–111)
Creatinine, Ser: 1.52 mg/dL — ABNORMAL HIGH (ref 0.61–1.24)
GFR calc Af Amer: 49 mL/min — ABNORMAL LOW (ref >60)
GFR calc non Af Amer: 42 mL/min — ABNORMAL LOW (ref >60)
Glucose, Bld: 90 mg/dL (ref 70–99)
Potassium: 4.1 mmol/L (ref 3.5–5.1)
Sodium: 139 mmol/L (ref 135–145)

## 2018-05-11 LAB — CBC WITH DIFFERENTIAL/PLATELET
Abs Immature Granulocytes: 0.14 10*3/uL — ABNORMAL HIGH (ref 0.00–0.07)
Basophils Absolute: 0.1 10*3/uL (ref 0.0–0.1)
Basophils Relative: 1 %
Eosinophils Absolute: 0.2 10*3/uL (ref 0.0–0.5)
Eosinophils Relative: 3 %
HCT: 26.6 % — ABNORMAL LOW (ref 39.0–52.0)
Hemoglobin: 8.3 g/dL — ABNORMAL LOW (ref 13.0–17.0)
Immature Granulocytes: 2 %
Lymphocytes Relative: 19 %
Lymphs Abs: 1.1 10*3/uL (ref 0.7–4.0)
MCH: 30.4 pg (ref 26.0–34.0)
MCHC: 31.2 g/dL (ref 30.0–36.0)
MCV: 97.4 fL (ref 80.0–100.0)
Monocytes Absolute: 0.7 10*3/uL (ref 0.1–1.0)
Monocytes Relative: 12 %
Neutro Abs: 3.8 10*3/uL (ref 1.7–7.7)
Neutrophils Relative %: 63 %
Platelets: 195 10*3/uL (ref 150–400)
RBC: 2.73 MIL/uL — ABNORMAL LOW (ref 4.22–5.81)
RDW: 14.3 % (ref 11.5–15.5)
WBC: 6 10*3/uL (ref 4.0–10.5)
nRBC: 0 % (ref 0.0–0.2)

## 2018-05-11 LAB — C-REACTIVE PROTEIN: CRP: 3.1 mg/dL — ABNORMAL HIGH (ref <1.0)

## 2018-05-11 LAB — MAGNESIUM: Magnesium: 1.9 mg/dL (ref 1.7–2.4)

## 2018-05-11 NOTE — Progress Notes (Addendum)
Aspiration Precautions  Order Aspiration precautions in EMR   Elevate HOB 30 degrees or higher Sit upright 90 degrees for meals/snacks  Maintain suction set up in room Perform oral care 4 times a day Inspect oral cavity for retained food or medications  Supervise or assist with meals as necessary based on assessment        Independent        Set up assist        Full supervision (feeder)

## 2018-05-11 NOTE — Plan of Care (Signed)
  Problem: Spiritual Needs Goal: Ability to function at adequate level Outcome: Not Applicable   Problem: Education: Goal: Knowledge of General Education information will improve Description Including pain rating scale, medication(s)/side effects and non-pharmacologic comfort measures Outcome: Progressing   Problem: Health Behavior/Discharge Planning: Goal: Ability to manage health-related needs will improve Outcome: Progressing   Problem: Clinical Measurements: Goal: Will remain free from infection Outcome: Progressing   Problem: Activity: Goal: Risk for activity intolerance will decrease Outcome: Progressing   Problem: Nutrition: Goal: Adequate nutrition will be maintained Outcome: Progressing   Problem: Pain Managment: Goal: General experience of comfort will improve Outcome: Progressing   Problem: Skin Integrity: Goal: Risk for impaired skin integrity will decrease Outcome: Progressing

## 2018-05-11 NOTE — Evaluation (Signed)
Physical Therapy Evaluation Patient Details Name: [name] MRN: [id number] DOB: [birth year] Today's Date: 05/11/2018   History of Present Illness  85 year old male with history of right lower extremity osteomyelitis status post BKA on 03/28/2018, atrial fibrillation, hypertension, seizures, GERD, stage III chronic any disease, coronary artery disease presented with right BKA stump erythema and pain  Clinical Impression  Orders received for PT evaluation. Patient demonstrates deficits in functional mobility as indicated below. Will benefit from continued skilled PT to address deficits and maximize function. Will see as indicated and progress as tolerated.  Recommend return to SNF for post acute rehabilitation upon discharge.    Follow Up Recommendations SNF    Equipment Recommendations  None recommended by PT    Recommendations for Other Services       Precautions / Restrictions Precautions Precautions: Fall Precaution Comments: BKA R LE      Mobility  Bed Mobility Overal bed mobility: Needs Assistance Bed Mobility: Rolling;Supine to Sit Rolling: Min guard   Supine to sit: Mod assist     General bed mobility comments: Moderate assist to elevate trunk and rotate hips to EOB. increased time and effort to perform. patient able to use UEs to assist  Transfers Overall transfer level: Needs assistance Equipment used: 2 person hand held assist(face to face with gait belt) Transfers: Sit to/from [employer] Transfers Sit to Stand: Mod assist;+2 physical assistance Stand pivot transfers: +2 physical assistance;Mod assist       General transfer comment: Increased time and effort to power to standing, slow transitional movements pivoting on LLE. Heavy reliance on physical assist.  Ambulation/Gait             General Gait Details: NT  Stairs            Wheelchair Mobility    Modified Rankin (Stroke Patients Only)       Balance Overall balance  assessment: Needs assistance Sitting-balance support: Bilateral upper extremity supported Sitting balance-Leahy Scale: Fair       Standing balance-Leahy Scale: Poor Standing balance comment: moderate to max assist for standing                             Pertinent Vitals/Pain Pain Assessment: 0-10 Pain Score: 6  Pain Location: Right residual limb Pain Descriptors / Indicators: Sore Pain Intervention(s): Monitored during session    Home Living Family/patient expects to be discharged to:: Skilled nursing facility                 Additional Comments: was at [facility]    Prior Function Level of Independence: Needs assistance   Gait / Transfers Assistance Needed: was working on transfers and pre gait with therapy team at SNF     Comments: Has had several admissions recently.     Hand Dominance   Dominant Hand: Right    Extremity/Trunk Assessment   Upper Extremity Assessment Upper Extremity Assessment: Generalized weakness    Lower Extremity Assessment Lower Extremity Assessment: RLE deficits/detail;Generalized weakness RLE: Unable to fully assess due to pain       Communication   Communication: No difficulties  Cognition Arousal/Alertness: Awake/alert Behavior During Therapy: Flat affect Overall Cognitive Status: Impaired/Different from baseline Area of Impairment: Problem solving                             Problem Solving: Requires verbal cues;Requires tactile cues;Slow processing  General Comments      Exercises     Assessment/Plan    PT Assessment Patient needs continued PT services  PT Problem List Decreased strength;Decreased activity tolerance;Decreased balance;Decreased mobility;Pain;Decreased skin integrity       PT Treatment Interventions DME instruction;Gait training;Functional mobility training;Therapeutic activities;Therapeutic exercise;Balance training;Neuromuscular re-education;Patient/family  education;Modalities    PT Goals (Current goals can be found in the Care Plan section)  Acute Rehab PT Goals Patient Stated Goal: to go back to rehabilitation center PT Goal Formulation: With patient/family Time For Goal Achievement: 05/25/18 Potential to Achieve Goals: Good    Frequency Min 2X/week   Barriers to discharge        Co-evaluation               AM-PAC PT "6 Clicks" Mobility  Outcome Measure Help needed turning from your back to your side while in a flat bed without using bedrails?: None Help needed moving from lying on your back to sitting on the side of a flat bed without using bedrails?: A Little Help needed moving to and from a bed to a chair (including a wheelchair)?: A Lot Help needed standing up from a chair using your arms (e.g., wheelchair or bedside chair)?: A Lot Help needed to walk in hospital room?: A Lot Help needed climbing 3-5 steps with a railing? : Total 6 Click Score: 14    End of Session Equipment Utilized During Treatment: Gait belt Activity Tolerance: Patient tolerated treatment well Patient left: in chair;with call bell/phone within reach;with chair alarm set;with family/visitor present Nurse Communication: Mobility status PT Visit Diagnosis: Difficulty in walking, not elsewhere classified (R26.2);Pain Pain - Right/Left: Right Pain - part of body: Knee    Time: 1008-1030 PT Time Calculation (min) (ACUTE ONLY): 22 min   Charges:   PT Evaluation $PT Eval Moderate Complexity: 1 Mod          Alben Deeds, PT DPT  Board Certified Neurologic Specialist Acute Rehabilitation Services Pager [phone] Office [place] 05/11/2018, 10:42 AM

## 2018-05-11 NOTE — Progress Notes (Signed)
Patient ID: [name], male   DOB: [birth year], 85 y.o.   MRN: [id number]  PROGRESS NOTE    [name]  PPI:[id number] DOB: Nov 01, 1933 DOA: 05/09/2018 PCP: Robert Battles, MD   Brief Narrative:  85 year old male with history of right lower extremity osteomyelitis status post BKA on 03/28/2018, atrial fibrillation, hypertension, seizures, GERD, stage III chronic any disease, coronary artery disease presented with right BKA stump erythema and pain.  He was started on IV antibiotics.  Orthopedic/Dr. [name] was consulted.  Assessment & Plan:   Active Problems:   Seizure disorder (HCC)   CKD (chronic kidney disease) stage 3, GFR 30-59 ml/min (HCC)   Anemia of chronic disease   Chronic atrial fibrillation   Wound infection  Right BKA stump cellulitis present on admission -Continue broad-spectrum antibiotics for now.  Orthopedics evaluation with Dr. [name] appreciated.  No need for any current surgical intervention.  Cultures negative so far.  Pain management.    Chronic kidney disease stage III -Renal function stable.  Monitor  Chronic atrial fibrillation -Currently rate controlled.  Continue diltiazem and metoprolol.  Resume aspirin on discharge.  Hypothyroidism Continue levothyroxine  Seizures Continue Depakote  Dyslipidemia--continue Zetia and pravastatin  Patient is on chronic steroids prednisone 5 mg daily    DVT prophylaxis: Lovenox Code Status: Full Family Communication: Wife at bedside Disposition Plan: SNF in 1 to 2 days once cellulitis improves.  Consultants: Orthopedics  Procedures: None  Antimicrobials: Vancomycin and Zosyn from 05/09/2018 onwards   Subjective: Patient seen and examined at bedside.  No overnight fever, nausea or vomiting.  Still complains of some stump pain and does not think that the redness is gotten better yet.  Wife at bedside.  Objective: Vitals:   05/10/18 1421 05/10/18 2101 05/11/18 0515 05/11/18 0918  BP: (!) 92/41 107/80 (!)  114/54 109/72  Pulse: (!) 58 61 61   Resp: 20 18    Temp: 98.3 F (36.8 C) 98.1 F (36.7 C) 98.4 F (36.9 C)   TempSrc: Oral Oral Oral   SpO2: 100% 100% 99%   Weight:      Height:        Intake/Output Summary (Last 24 hours) at 05/11/2018 1017 Last data filed at 05/11/2018 0543 Gross per 24 hour  Intake 350 ml  Output -  Net 350 ml   Filed Weights   05/09/18 2200  Weight: 70.3 kg    Examination:  General exam: Appears calm and comfortable.  Elderly male lying in bed.  No acute distress Respiratory system: Bilateral decreased breath sounds at bases, scattered crackles Cardiovascular system: Rate controlled, S1-S2 heard Gastrointestinal system: Abdomen is nondistended, soft and nontender. Normal bowel sounds heard. Extremities: No cyanosis, clubbing; right BKA with stump erythema and warmth without any discharge; not much improvement in erythema.  Data Reviewed: I have personally reviewed following labs and imaging studies  CBC: Recent Labs  Lab 05/07/18 1106 05/09/18 1517 05/10/18 0431 05/11/18 0342  WBC 7.4 8.0 6.4 6.0  NEUTROABS 4.9 6.4  --  3.8  HGB 8.6* 9.5* 8.8* 8.3*  HCT 28.4* 31.7* 28.7* 26.6*  MCV 101.1* 99.1 97.3 97.4  PLT 206 224 210 063   Basic Metabolic Panel: Recent Labs  Lab 05/07/18 1106 05/09/18 1218 05/10/18 0431 05/11/18 0342  NA 138 140 139 139  K 4.2 5.4* 3.8 4.1  CL 106 107 106 107  CO2 23 24 25 23   GLUCOSE 100* 115* 83 90  BUN 26* 25* 24* 33*  CREATININE 1.33* 1.51*  1.27* 1.52*  CALCIUM 8.2* 8.8* 8.2* 8.2*  MG  --   --   --  1.9   GFR: Estimated Creatinine Clearance: 37.3 mL/min (A) (by C-G formula based on SCr of 1.52 mg/dL (H)). Liver Function Tests: Recent Labs  Lab 05/09/18 1218  AST 38  ALT 43  ALKPHOS 56  BILITOT 0.5  PROT 5.7*  ALBUMIN 3.0*   No results for input(s): LIPASE, AMYLASE in the last 168 hours. No results for input(s): AMMONIA in the last 168 hours. Coagulation Profile: No results for input(s): INR,  PROTIME in the last 168 hours. Cardiac Enzymes: No results for input(s): CKTOTAL, CKMB, CKMBINDEX, TROPONINI in the last 168 hours. BNP (last 3 results) Recent Labs    10/22/17 1120 11/22/17 1130 04/23/18 1355  PROBNP 3,243* 3,962* 3,612*   HbA1C: No results for input(s): HGBA1C in the last 72 hours. CBG: No results for input(s): GLUCAP in the last 168 hours. Lipid Profile: No results for input(s): CHOL, HDL, LDLCALC, TRIG, CHOLHDL, LDLDIRECT in the last 72 hours. Thyroid Function Tests: No results for input(s): TSH, T4TOTAL, FREET4, T3FREE, THYROIDAB in the last 72 hours. Anemia Panel: No results for input(s): VITAMINB12, FOLATE, FERRITIN, TIBC, IRON, RETICCTPCT in the last 72 hours. Sepsis Labs: Recent Labs  Lab 05/07/18 1240 05/09/18 1230 05/09/18 1517  LATICACIDVEN 1.4 2.1* 1.4    Recent Results (from the past 240 hour(s))  Culture, blood (routine x 2)     Status: None (Preliminary result)   Collection Time: 05/09/18  3:10 PM  Result Value Ref Range Status   Specimen Description BLOOD LEFT ANTECUBITAL  Final   Special Requests   Final    BOTTLES DRAWN AEROBIC AND ANAEROBIC Blood Culture adequate volume   Culture   Final    NO GROWTH < 24 HOURS Performed at [facility], [place] [address], [place], [place] [address]    Report Status PENDING  Incomplete  Blood Culture (routine x 2)     Status: None (Preliminary result)   Collection Time: 05/09/18  3:16 PM  Result Value Ref Range Status   Specimen Description BLOOD RIGHT WRIST  Final   Special Requests   Final    BOTTLES DRAWN AEROBIC AND ANAEROBIC Blood Culture adequate volume   Culture   Final    NO GROWTH < 24 HOURS Performed at [facility], [place] [address] [place], [place] [address]    Report Status PENDING  Incomplete  MRSA PCR Screening     Status: None   Collection Time: 05/09/18  9:57 PM  Result Value Ref Range Status   MRSA by PCR NEGATIVE NEGATIVE Final    Comment:        The GeneXpert  MRSA Assay (FDA approved for NASAL specimens only), is one component of a comprehensive MRSA colonization surveillance program. It is not intended to diagnose MRSA infection nor to guide or monitor treatment for MRSA infections. Performed at [facility], [place] [address], [place], [place] [address]          Radiology Studies: Dg Knee Right Port  Result Date: 05/09/2018 CLINICAL DATA:  Apparent cellulitis at the previous BKA site EXAM: PORTABLE RIGHT KNEE - 1-2 VIEW COMPARISON:  03/26/2018, 04/18/2017 FINDINGS: Changes consistent with below the knee amputation are noted. Knee prosthesis is again noted and stable. Mild irregularity is noted laterally at the amputation site which may represent some local cellulitis. No definitive bony erosion is seen. IMPRESSION: Changes consistent with prior BKA  and right knee replacement. Soft tissue changes are noted as described without bony erosion. Electronically Signed   By: Inez Catalina M.D.   On: 05/09/2018 14:50        Scheduled Meds: . calcium-vitamin D  1 tablet Oral Q breakfast  . cholecalciferol  1,000 Units Oral Daily  . diltiazem  300 mg Oral Daily  . divalproex  250 mg Oral Daily   And  . divalproex  500 mg Oral QHS  . doxazosin  2 mg Oral QPM  . enoxaparin (LOVENOX) injection  40 mg Subcutaneous Q24H  . ezetimibe  5 mg Oral QHS  . feeding supplement (ENSURE ENLIVE)  237 mL Oral BID BM  . feeding supplement (PRO-STAT SUGAR FREE 64)  30 mL Oral BID  . fentaNYL (SUBLIMAZE) injection  50 mcg Intravenous Once  . ferrous sulfate  325 mg Oral BID WC  . lactobacillus acidophilus & bulgar  1 tablet Oral TID WC  . levothyroxine  150 mcg Oral Q0600  . metoprolol tartrate  25 mg Oral BID  . multivitamin with minerals  1 tablet Oral QPM  . pantoprazole  40 mg Oral Daily  . pravastatin  40 mg Oral QPM  . predniSONE  5 mg Oral Q breakfast  . senna-docusate  1 tablet Oral BID  . vitamin B-12  500 mcg Oral QHS   Continuous  Infusions: . piperacillin-tazobactam (ZOSYN)  IV 3.375 g (05/11/18 0543)  . vancomycin 1,000 mg (05/10/18 1748)     LOS: 2 days        Aline August, MD [facility] 05/11/2018, 10:17 AM

## 2018-05-12 ENCOUNTER — Other Ambulatory Visit: Payer: Self-pay

## 2018-05-12 ENCOUNTER — Other Ambulatory Visit: Payer: Self-pay | Admitting: *Deleted

## 2018-05-12 LAB — CBC WITH DIFFERENTIAL/PLATELET
Abs Immature Granulocytes: 0.16 10*3/uL — ABNORMAL HIGH (ref 0.00–0.07)
Basophils Absolute: 0 10*3/uL (ref 0.0–0.1)
Basophils Relative: 1 %
Eosinophils Absolute: 0.1 10*3/uL (ref 0.0–0.5)
Eosinophils Relative: 2 %
HCT: 26.4 % — ABNORMAL LOW (ref 39.0–52.0)
Hemoglobin: 8.2 g/dL — ABNORMAL LOW (ref 13.0–17.0)
Immature Granulocytes: 3 %
Lymphocytes Relative: 18 %
Lymphs Abs: 1.1 10*3/uL (ref 0.7–4.0)
MCH: 30.1 pg (ref 26.0–34.0)
MCHC: 31.1 g/dL (ref 30.0–36.0)
MCV: 97.1 fL (ref 80.0–100.0)
Monocytes Absolute: 0.7 10*3/uL (ref 0.1–1.0)
Monocytes Relative: 12 %
Neutro Abs: 4 10*3/uL (ref 1.7–7.7)
Neutrophils Relative %: 64 %
Platelets: 186 10*3/uL (ref 150–400)
RBC: 2.72 MIL/uL — ABNORMAL LOW (ref 4.22–5.81)
RDW: 14.2 % (ref 11.5–15.5)
WBC: 6.2 10*3/uL (ref 4.0–10.5)
nRBC: 0 % (ref 0.0–0.2)

## 2018-05-12 LAB — BASIC METABOLIC PANEL
Anion gap: 10 (ref 5–15)
BUN: 36 mg/dL — ABNORMAL HIGH (ref 8–23)
CO2: 24 mmol/L (ref 22–32)
Calcium: 8.2 mg/dL — ABNORMAL LOW (ref 8.9–10.3)
Chloride: 106 mmol/L (ref 98–111)
Creatinine, Ser: 1.56 mg/dL — ABNORMAL HIGH (ref 0.61–1.24)
GFR calc Af Amer: 47 mL/min — ABNORMAL LOW (ref >60)
GFR calc non Af Amer: 41 mL/min — ABNORMAL LOW (ref >60)
Glucose, Bld: 85 mg/dL (ref 70–99)
Potassium: 4.1 mmol/L (ref 3.5–5.1)
Sodium: 140 mmol/L (ref 135–145)

## 2018-05-12 LAB — MAGNESIUM: Magnesium: 2 mg/dL (ref 1.7–2.4)

## 2018-05-12 MED ORDER — CEPHALEXIN 500 MG PO CAPS
500.0000 mg | ORAL_CAPSULE | Freq: Three times a day (TID) | ORAL | Status: DC
  Administered 2018-05-12: 500 mg via ORAL
  Filled 2018-05-12: qty 1

## 2018-05-12 MED ORDER — HYDROCODONE-ACETAMINOPHEN 10-325 MG PO TABS: 1.0000 | ORAL_TABLET | Freq: Three times a day (TID) | ORAL | 0 refills | Status: DC | PRN

## 2018-05-12 MED ORDER — CEPHALEXIN 500 MG PO CAPS: 500.0000 mg | ORAL_CAPSULE | Freq: Three times a day (TID) | ORAL | 0 refills | Status: AC

## 2018-05-12 MED ORDER — ENSURE ENLIVE PO LIQD: 237.0000 mL | Freq: Two times a day (BID) | ORAL | Status: DC

## 2018-05-12 MED ORDER — DOXYCYCLINE HYCLATE 100 MG PO TABS: 100.0000 mg | ORAL_TABLET | Freq: Two times a day (BID) | ORAL | 0 refills | Status: AC

## 2018-05-12 MED ORDER — DOXYCYCLINE HYCLATE 100 MG PO TABS
100.0000 mg | ORAL_TABLET | Freq: Two times a day (BID) | ORAL | Status: DC
  Administered 2018-05-12: 100 mg via ORAL
  Filled 2018-05-12: qty 1

## 2018-05-12 NOTE — Patient Outreach (Signed)
[name]) Care Management  05/12/2018  [name] [birth year] [id number]  Member admitted to [facility] on 05-09-2018 due to R-BKA infection. [facility] and [place] made aware.  Will follow up with member when discharged to home pending disposition needs.  [name], RN-BSN  [facility] Care Management  Community Care Management Coordinator  [phone] [email or web address]

## 2018-05-12 NOTE — Clinical Social Work Note (Signed)
Clinical Social Work Assessment  Patient Details  Name: [name] MRN: [id number] Date of Birth: 1933-07-24  Date of referral:  05/12/18               Reason for consult:  Facility Placement                Permission sought to share information with:  Facility Sport and exercise psychologist, Family Supports Permission granted to share information::  Yes, Verbal Permission Granted  Name::     [name]  Agency::  [facility]  Relationship::  Spouse  Contact Information:  [phone]  Housing/Transportation Living arrangements for the past 2 months:  [place], [place] of Information:  Patient, Spouse Patient Interpreter Needed:  None Criminal Activity/Legal Involvement Pertinent to Current Situation/Hospitalization:  No - Comment as needed Significant Relationships:  Spouse Lives with:  Spouse Do you feel safe going back to the place where you live?  Yes Need for family participation in patient care:  No (Coment)  Care giving concerns:  CSW received consult for possible SNF placement at time of discharge. CSW spoke with patient and his wife at bedside. She reports that patient has been at [facility] for rehab and will return at discharge. CSW to continue to follow and assist with discharge planning needs.   Social Worker assessment / plan:  CSW spoke with patient concerning possibility of rehab at [facility] before returning home.  Employment status:  Retired Forensic scientist:  Medicare PT Recommendations:  [place] / Referral to community resources:  [place]  Patient/Family's Response to care:  Patient's spouse reports understanding of discharge plan and requests CSW make sure [name] has patient's antibiotic. CSW confirmed that [name] gets their medications delivered every night.   Patient/Family's Understanding of and Emotional Response to Diagnosis, Current Treatment, and Prognosis:  Patient/family is realistic  regarding therapy needs and expressed being hopeful for return to SNF placement. Patient expressed understanding of CSW role and discharge process as well as medical condition. No questions/concerns about plan or treatment.    Emotional Assessment Appearance:  Appears stated age Attitude/Demeanor/Rapport:  Lethargic Affect (typically observed):  Accepting, Quiet Orientation:  Oriented to Self, Oriented to Place, Oriented to  Time, Oriented to Situation Alcohol / Substance use:  Not Applicable Psych involvement (Current and /or in the community):  No (Comment)  Discharge Needs  Concerns to be addressed:  Care Coordination Readmission within the last 30 days:  No Current discharge risk:  None Barriers to Discharge:  No Barriers Identified   Benard Halsted, LCSW 05/12/2018, 10:35 AM

## 2018-05-12 NOTE — Consult Note (Signed)
   [facility] CM Inpatient Consult   05/12/2018  [name] Nov 02, 1933 [id number]  Patient is currently active with [name] for chronic disease management services.  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.    Per chart review, disposition plan is for patient to return to [name]. Will make [name] LCSW aware of discharge plans.  Of note, [name] services does not replace or interfere with any services that are needed or arranged by inpatient case management or social work.   [name], MSN, [facility] Liaison Nurse Mobile Phone [phone]  Toll free office [phone]

## 2018-05-12 NOTE — Discharge Summary (Signed)
Physician Discharge Summary  [name]:[id number] DOB: September 02, 1933 DOA: 05/09/2018  PCP: [name], MD  Admit date: 05/09/2018 Discharge date: 05/12/2018  Admitted From: SNF Disposition:  SNF  Recommendations for Outpatient Follow-up:  1. Follow up with SNF provider at earliest convenience 2. Follow-up with Dr. [name] in a week 3. 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  He was started on IV antibiotics.  Orthopedic/Dr. [name] was consulted.  Dr. [name] recommended antibiotic treatment and outpatient follow-up with Dr. [name].  His cellulitis is slightly improving.  He will be discharged on oral Keflex and doxy.  He is hemodynamically stable with no fevers.  Discharge Diagnoses:  Active Problems:   Seizure disorder (HCC)   CKD (chronic kidney disease) stage 3, GFR 30-59 ml/min (HCC)   Anemia of chronic disease   Chronic atrial fibrillation   Wound infection   Right BKA stump cellulitis present on admission -Treated with vancomycin and Zosyn. Orthopedics evaluation with Dr. [name] appreciated.  No need for any current surgical intervention.  Cultures negative so far.  Cellulitis is slightly improving.  No worsening pain.  Dr. [name] is okay for the patient to be discharged on oral antibiotics.  Will discharge on oral Keflex and doxycycline with outpatient follow-up with Dr. [name] within a week.  Chronic kidney disease stage III -Renal function stable.  Outpatient follow-up.  Chronic atrial fibrillation -Currently rate controlled.  Continue diltiazem and metoprolol.  Resume  aspirin on discharge.  Hypothyroidism -Continue levothyroxine  Seizures -Continue Depakote  Dyslipidemia--continue Zetia and pravastatin  Patient is on chronic steroids prednisone 5 mg daily   Discharge Instructions  Discharge Instructions    Call MD for:  extreme fatigue   Complete by:  As directed    Call MD for:  persistant dizziness or light-headedness   Complete by:  As directed    Call MD for:  persistant nausea and vomiting   Complete by:  As directed    Call MD for:  redness, tenderness, or signs of infection (pain, swelling, redness, odor or green/yellow discharge around incision site)   Complete by:  As directed    Call MD for:  severe uncontrolled pain   Complete by:  As directed    Call MD for:  temperature >100.4   Complete by:  As directed    Diet - low sodium heart healthy   Complete by:  As directed    Increase activity slowly   Complete by:  As directed      Allergies as of 05/12/2018      Reactions   Eliquis [apixaban] Other (See Comments)   bleeding   Demerol [meperidine] Nausea And Vomiting   Keppra [levetiracetam] Other (See Comments)   Causes sleepiness, mental status changes      Medication List    STOP taking these medications   oseltamivir 30 MG capsule Commonly known as:  TAMIFLU   oxycodone 5 MG capsule Commonly known as:  OXY-IR   potassium chloride SA 20 MEQ tablet Commonly known as:  K-DUR,KLOR-CON     TAKE these medications   acetaminophen 325 MG tablet Commonly known as:  TYLENOL Take 2 tablets (  650 mg total) by mouth every 6 (six) hours as needed for mild pain (pain score 1-3 or temp > 100.5).   alendronate 70 MG tablet Commonly known as:  FOSAMAX Take 70 mg by mouth every Wednesday. Remain upright for 30-60 minutes.   ASPERCREME LIDOCAINE 4 % Ptch Generic drug:  Lidocaine Apply 1 patch topically 2 (two) times daily. What changed:  Another medication with the same name was removed. Continue taking this medication,  and follow the directions you see here.   aspirin 325 MG EC tablet Take 1 tablet (325 mg total) by mouth daily.   bisacodyl 10 MG suppository Commonly known as:  DULCOLAX Place 1 suppository (10 mg total) rectally daily as needed for moderate constipation.   calcium-vitamin D 500-200 MG-UNIT tablet Commonly known as:  OSCAL WITH D Take 1 tablet by mouth daily after breakfast.   cephALEXin 500 MG capsule Commonly known as:  KEFLEX Take 1 capsule (500 mg total) by mouth 3 (three) times daily for 7 days.   CERTAVITE/ANTIOXIDANTS Tabs Take 1 tablet by mouth every evening. With Iron 18 mg/ folic acid 361 mcg   cholecalciferol 25 MCG (1000 UT) tablet Commonly known as:  VITAMIN D Take 1,000 Units by mouth daily.   diclofenac sodium 1 % Gel Commonly known as:  VOLTAREN Apply 2 g topically daily as needed (pain).   diltiazem 300 MG 24 hr capsule Commonly known as:  CARDIZEM CD TAKE 1 CAPSULE(300 MG) BY MOUTH EVERY MORNING. DO NOT CRUSH What changed:  See the new instructions.   divalproex 250 MG DR tablet Commonly known as:  DEPAKOTE Take 250-500 mg by mouth See admin instructions. Take one tablet (250 mg) by mouth ever morning and two tablets (500 mg) every evening (for epilepsy)   docusate sodium 100 MG capsule Commonly known as:  COLACE Take 1 capsule (100 mg total) by mouth 2 (two) times daily.   doxazosin 4 MG tablet Commonly known as:  CARDURA Take 2 mg by mouth every evening. Hold med for SBP less than 110  And DBP less than 60   doxycycline 100 MG tablet Commonly known as:  VIBRA-TABS Take 1 tablet (100 mg total) by mouth every 12 (twelve) hours for 7 days.   ezetimibe 10 MG tablet Commonly known as:  ZETIA Take 5 mg by mouth at bedtime.   feeding supplement (ENSURE ENLIVE) Liqd Take 237 mLs by mouth 2 (two) times daily between meals. What changed:  when to take this   feeding supplement (PRO-STAT SUGAR FREE 64) Liqd Take 30 mLs by mouth 2 (two) times  daily.   ferrous sulfate 325 (65 FE) MG tablet Take 1 tablet (325 mg total) by mouth 2 (two) times daily with a meal.   fluticasone 50 MCG/ACT nasal spray Commonly known as:  FLONASE Place 2 sprays into both nostrils daily as needed for allergies or rhinitis.   furosemide 20 MG tablet Commonly known as:  LASIX Take 20 mg by mouth daily. What changed:  Another medication with the same name was removed. Continue taking this medication, and follow the directions you see here.   HYDROcodone-acetaminophen 10-325 MG tablet Commonly known as:  NORCO Take 1 tablet by mouth every 8 (eight) hours as needed for moderate pain. What changed:    when to take this  reasons to take this  additional instructions   ipratropium-albuterol 0.5-2.5 (3) MG/3ML Soln Commonly known as:  DUONEB Take 3 mLs by nebulization every 4 (four) hours as needed (wheezing/  shortness of breath).   lactobacillus acidophilus & bulgar chewable tablet Chew 1 tablet by mouth 3 (three) times daily with meals.   levothyroxine 150 MCG tablet Commonly known as:  SYNTHROID, LEVOTHROID Take 150 mcg by mouth daily before breakfast.   magnesium citrate Soln Take 296 mLs (1 Bottle total) by mouth once as needed for severe constipation.   methocarbamol 500 MG tablet Commonly known as:  ROBAXIN Take 1 tablet (500 mg total) by mouth every 6 (six) hours as needed for muscle spasms.   metoprolol tartrate 25 MG tablet Commonly known as:  LOPRESSOR Take 1 tablet (25 mg total) by mouth 2 (two) times daily. Take with or immediately following a meal. What changed:    when to take this  additional instructions   MUSCLE RUB 10-15 % Crea Apply 1 application topically daily as needed (shoulder pain).   nitroGLYCERIN 0.4 MG SL tablet Commonly known as:  NITROSTAT Place 0.4 mg under the tongue every 5 (five) minutes as needed for chest pain (call 911 afer 3 doses and if chest pain persists).   omeprazole 20 MG  capsule Commonly known as:  PRILOSEC Take 20 mg by mouth daily before breakfast.   polyethylene glycol packet Commonly known as:  MIRALAX / GLYCOLAX Take 17 g by mouth daily as needed for mild constipation.   potassium chloride 10 MEQ CR capsule Commonly known as:  MICRO-K Take 20 mEq by mouth daily.   pravastatin 40 MG tablet Commonly known as:  PRAVACHOL Take 40 mg by mouth every evening.   predniSONE 5 MG tablet Commonly known as:  DELTASONE Take 2 tablets (10 mg total) by mouth daily with breakfast. What changed:  how much to take   Probiotic Caps Take 1 capsule by mouth 3 (three) times daily with meals. Lacto.acidophilus-bif.animalis - capsule, sprinkle; 5 billion cell   sodium chloride 0.65 % Soln nasal spray Commonly known as:  OCEAN Place 1 spray into both nostrils daily as needed for congestion.   vitamin B-12 500 MCG tablet Commonly known as:  CYANOCOBALAMIN Take 500 mcg by mouth at bedtime. Vitamin B12      Follow-up Information    [name], MD. Schedule an appointment as soon as possible for a visit in 1 week(s).   Specialty:  Internal Medicine Contact information: [place] [address] [phone]        Sanda Klein, MD .   Specialty:  Cardiology Contact information: [address] [place] [address] [phone]        Newt Minion, MD. Schedule an appointment as soon as possible for a visit in 1 week(s).   Specialty:  Orthopedic Surgery Contact information: [address] [place] [address] [phone]          Allergies  Allergen Reactions  . Eliquis [Apixaban] Other (See Comments)    bleeding  . Demerol [Meperidine] Nausea And Vomiting  . Keppra [Levetiracetam] Other (See Comments)    Causes sleepiness, mental status changes    Consultations:  Orthopedics   Procedures/Studies: Dg Chest 2 View  Result Date: 05/07/2018 CLINICAL DATA:  The 13 cough for 3 days.  EXAM: CHEST - 2 VIEW COMPARISON:  04/20/2018 FINDINGS: Cardiomediastinal silhouette is normal. Mediastinal contours appear intact. Calcific atherosclerotic disease of the coronary arteries. There is no evidence of focal airspace consolidation, pleural effusion or pneumothorax. Persistent elevation of left hemidiaphragm with linear airspace consolidation/atelectasis or scarring in the left lung base. Osseous structures are without acute  abnormality. Soft tissues are grossly normal. IMPRESSION: Persistent elevation of the left hemidiaphragm with linear airspace consolidation/atelectasis or scarring in the left lung base. No acute findings. Electronically Signed   By: Fidela Salisbury M.D.   On: 05/07/2018 13:38   Dg Chest 2 View  Result Date: 04/20/2018 CLINICAL DATA:  85 year old male choked on pills today. Possible aspiration. EXAM: CHEST - 2 VIEW COMPARISON:  03/24/2018 radiographs and earlier. FINDINGS: Semi upright AP and lateral views. Larger lung volumes compared to most prior exams. Chronic linear atelectasis or scarring at the left lung base with mild elevation of the left hemidiaphragm. Irregular chronic right anterior rib costochondral calcifications. No pneumothorax, pulmonary edema, pleural effusion or acute pulmonary opacity. Tortuous descending thoracic aorta. Other mediastinal contours are within normal limits. No acute osseous abnormality identified. Negative visible bowel gas pattern. IMPRESSION: No evidence of aspiration. Stable chest, including elevated left hemidiaphragm with associated atelectasis or scarring. Electronically Signed   By: Genevie Ann M.D.   On: 04/20/2018 14:18   Dg Knee Right Port  Result Date: 05/09/2018 CLINICAL DATA:  Apparent cellulitis at the previous BKA site EXAM: PORTABLE RIGHT KNEE - 1-2 VIEW COMPARISON:  03/26/2018, 04/18/2017 FINDINGS: Changes consistent with below the knee amputation are noted. Knee prosthesis is again noted and stable. Mild irregularity is  noted laterally at the amputation site which may represent some local cellulitis. No definitive bony erosion is seen. IMPRESSION: Changes consistent with prior BKA and right knee replacement. Soft tissue changes are noted as described without bony erosion. Electronically Signed   By: Inez Catalina M.D.   On: 05/09/2018 14:50       Subjective: Patient seen and examined at bedside.  He feels that his stomach pain is improving but still there.  Redness is also slightly improved.  No overnight fever or vomiting.  Wife at bedside.  Discharge Exam: Vitals:   05/11/18 2058 05/12/18 0900  BP: 111/63 121/64  Pulse: 69 82  Resp: 18 16  Temp: 98.2 F (36.8 C) 98.6 F (37 C)  SpO2: 95% 100%   Vitals:   05/11/18 0918 05/11/18 1410 05/11/18 2058 05/12/18 0900  BP: 109/72 105/61 111/63 121/64  Pulse:  (!) 52 69 82  Resp:  16 18 16   Temp:  98.3 F (36.8 C) 98.2 F (36.8 C) 98.6 F (37 C)  TempSrc:  Oral Oral Oral  SpO2:  100% 95% 100%  Weight:      Height:        General: Pt is alert, awake, not in acute distress.  Poor historian Cardiovascular: rate controlled, S1/S2 + Respiratory: bilateral decreased breath sounds at bases, no wheezing Abdominal: Soft, NT, ND, bowel sounds + Extremities: right BKA with stump erythema and warmth without any discharge;  slight improvement in erythema and tenderness compared to 05/11/2018.    The results of significant diagnostics from this hospitalization (including imaging, microbiology, ancillary and laboratory) are listed below for reference.     Microbiology: Recent Results (from the past 240 hour(s))  Culture, blood (routine x 2)     Status: None (Preliminary result)   Collection Time: 05/09/18  3:10 PM  Result Value Ref Range Status   Specimen Description BLOOD LEFT ANTECUBITAL  Final   Special Requests   Final    BOTTLES DRAWN AEROBIC AND ANAEROBIC Blood Culture adequate volume Performed at [facility], [address]  [place], [place] [address]    Culture NO GROWTH 3 DAYS  Final   Report Status  PENDING  Incomplete  Blood Culture (routine x 2)     Status: None (Preliminary result)   Collection Time: 05/09/18  3:16 PM  Result Value Ref Range Status   Specimen Description BLOOD RIGHT WRIST  Final   Special Requests   Final    BOTTLES DRAWN AEROBIC AND ANAEROBIC Blood Culture adequate volume Performed at [facility], [place] [address] [place], [place] [address]    Culture NO GROWTH 3 DAYS  Final   Report Status PENDING  Incomplete  MRSA PCR Screening     Status: None   Collection Time: 05/09/18  9:57 PM  Result Value Ref Range Status   MRSA by PCR NEGATIVE NEGATIVE Final    Comment:        The GeneXpert MRSA Assay (FDA approved for NASAL specimens only), is one component of a comprehensive MRSA colonization surveillance program. It is not intended to diagnose MRSA infection nor to guide or monitor treatment for MRSA infections. Performed at [facility], [place] [address] [place], [place] [address]      Labs: BNP (last 3 results) Recent Labs    07/01/17 1526 07/11/17 0346  BNP 345.9* 242.3*   Basic Metabolic Panel: Recent Labs  Lab 05/07/18 1106 05/09/18 1218 05/10/18 0431 05/11/18 0342 05/12/18 0439  NA 138 140 139 139 140  K 4.2 5.4* 3.8 4.1 4.1  CL 106 107 106 107 106  CO2 23 24 25 23 24   GLUCOSE 100* 115* 83 90 85  BUN 26* 25* 24* 33* 36*  CREATININE 1.33* 1.51* 1.27* 1.52* 1.56*  CALCIUM 8.2* 8.8* 8.2* 8.2* 8.2*  MG  --   --   --  1.9 2.0   Liver Function Tests: Recent Labs  Lab 05/09/18 1218  AST 38  ALT 43  ALKPHOS 56  BILITOT 0.5  PROT 5.7*  ALBUMIN 3.0*   No results for input(s): LIPASE, AMYLASE in the last 168 hours. No results for input(s): AMMONIA in the last 168 hours. CBC: Recent Labs  Lab 05/07/18 1106 05/09/18 1517 05/10/18 0431 05/11/18 0342 05/12/18 0439  WBC 7.4 8.0 6.4 6.0 6.2  NEUTROABS 4.9 6.4  --  3.8 4.0  HGB 8.6* 9.5* 8.8*  8.3* 8.2*  HCT 28.4* 31.7* 28.7* 26.6* 26.4*  MCV 101.1* 99.1 97.3 97.4 97.1  PLT 206 224 210 195 186   Cardiac Enzymes: No results for input(s): CKTOTAL, CKMB, CKMBINDEX, TROPONINI in the last 168 hours. BNP: Invalid input(s): POCBNP CBG: No results for input(s): GLUCAP in the last 168 hours. D-Dimer No results for input(s): DDIMER in the last 72 hours. Hgb A1c No results for input(s): HGBA1C in the last 72 hours. Lipid Profile No results for input(s): CHOL, HDL, LDLCALC, TRIG, CHOLHDL, LDLDIRECT in the last 72 hours. Thyroid function studies No results for input(s): TSH, T4TOTAL, T3FREE, THYROIDAB in the last 72 hours.  Invalid input(s): FREET3 Anemia work up No results for input(s): VITAMINB12, FOLATE, FERRITIN, TIBC, IRON, RETICCTPCT in the last 72 hours. Urinalysis    Component Value Date/Time   COLORURINE YELLOW 05/07/2018 1106   APPEARANCEUR CLEAR 05/07/2018 1106   LABSPEC 1.010 05/07/2018 1106   PHURINE 6.0 05/07/2018 1106   GLUCOSEU NEGATIVE 05/07/2018 1106   [place] 05/07/2018 1106   [place] 05/07/2018 1106   [place] 05/07/2018 1106   PROTEINUR NEGATIVE 05/07/2018 1106   UROBILINOGEN 1.0 09/16/2007 1212   NITRITE NEGATIVE 05/07/2018 1106   LEUKOCYTESUR TRACE (A) 05/07/2018 1106   Sepsis Labs Invalid  input(s): PROCALCITONIN,  WBC,  LACTICIDVEN Microbiology Recent Results (from the past 240 hour(s))  Culture, blood (routine x 2)     Status: None (Preliminary result)   Collection Time: 05/09/18  3:10 PM  Result Value Ref Range Status   Specimen Description BLOOD LEFT ANTECUBITAL  Final   Special Requests   Final    BOTTLES DRAWN AEROBIC AND ANAEROBIC Blood Culture adequate volume Performed at [facility], [address] [place], [place] [address]    Culture NO GROWTH 3 DAYS  Final   Report Status PENDING  Incomplete  Blood Culture (routine x 2)     Status: None (Preliminary result)   Collection Time: 05/09/18  3:16 PM   Result Value Ref Range Status   Specimen Description BLOOD RIGHT WRIST  Final   Special Requests   Final    BOTTLES DRAWN AEROBIC AND ANAEROBIC Blood Culture adequate volume Performed at [facility], [place] [address], [place], [place] [address]    Culture NO GROWTH 3 DAYS  Final   Report Status PENDING  Incomplete  MRSA PCR Screening     Status: None   Collection Time: 05/09/18  9:57 PM  Result Value Ref Range Status   MRSA by PCR NEGATIVE NEGATIVE Final    Comment:        The GeneXpert MRSA Assay (FDA approved for NASAL specimens only), is one component of a comprehensive MRSA colonization surveillance program. It is not intended to diagnose MRSA infection nor to guide or monitor treatment for MRSA infections. Performed at [facility], [place] [address] [place], [place] [address]      Time coordinating discharge: 35 minutes  SIGNED:   Aline August, MD  [facility] 05/12/2018, 10:09 AM

## 2018-05-12 NOTE — NC FL2 (Signed)
[place] LEVEL OF CARE SCREENING TOOL     IDENTIFICATION  Patient Name: [name] Birthdate: [birth year] Sex: male Admission Date (Current Location): 05/09/2018  [facility] and [place] Number:  Herbalist and Address:  [facility] [place], [place] [address] [place], [place] [address]      Provider Number: [id number]  Attending Physician Name and Address:  Aline August, MD  Relative Name and Phone Number:  [name], spouse, [phone]    Current Level of Care: Hospital Recommended Level of Care: [place] Prior Approval Number:    Date Approved/Denied:   PASRR Number: [id number]  Discharge Plan: SNF    Current Diagnoses: Patient Active Problem List   Diagnosis Date Noted  . Wound infection 05/09/2018  . AKI (acute kidney injury) ([place])   . S/P BKA (below knee amputation) unilateral, right ([place])   . Unilateral complete BKA, right, initial encounter ([place])   . Tachypnea   . Post-operative pain   . Subacute osteomyelitis, right ankle and foot ([place])   . Paralysis of right lower extremity ([place])   . TIA (transient ischemic attack) 03/24/2018  . Encephalopathy 03/24/2018  . Cellulitis 03/15/2018  . Hypernatremia 03/15/2018  . Persistent atrial fibrillation 11/10/2017  . Severe muscle deconditioning 11/10/2017  . Hemorrhage 10/22/2017  . CAD S/P percutaneous coronary angioplasty 10/22/2017  . Acute hypoxemic respiratory failure ([place]) 07/17/2017  . Aspiration syndrome, subsequent encounter 07/11/2017  . Aspiration pneumonia ([place]) 07/01/2017  . Acute encephalopathy 07/01/2017  . Thrombocytopenia ([place]) 07/01/2017  . Chronic diastolic (congestive) heart failure ([place]) 07/01/2017  . Anemia of chronic disease 07/01/2017  . Troponin level elevated 07/01/2017  . Chronic atrial fibrillation 07/01/2017  . Goals of care, counseling/discussion   . Palliative care by specialist   . Acute metabolic encephalopathy 32/67/1245   . CKD (chronic kidney disease) stage 3, GFR 30-59 ml/min (HCC)   . CAP (community acquired pneumonia) 04/17/2017  . Hallux rigidus, right foot 03/10/2016  . Lumbosacral spondylosis without myelopathy 10/29/2015  . Memory difficulty 09/22/2015  . Abnormality of gait 09/22/2015  . Hyperglycemia 07/06/2015  . Chronic pain 07/06/2015  . Chronic insomnia 03/30/2015  . Transient alteration of awareness 03/30/2015  . Abnormal liver function   . Altered mental status 01/31/2015  . Essential hypertension 01/31/2015  . Constipation 01/31/2015  . Hypothyroidism 01/31/2015  . Seizure disorder ([place]) 01/31/2015  . Bladder outlet obstruction 01/31/2015  . GERD (gastroesophageal reflux disease) 01/31/2015  . Chronic back pain 01/31/2015  . Acute kidney injury ([place]) 01/31/2015    Orientation RESPIRATION BLADDER Height & Weight     Self, Time, Situation, Place  Normal Incontinent Weight: 70.3 kg Height:  5\' 9"  (175.3 cm)  BEHAVIORAL SYMPTOMS/MOOD NEUROLOGICAL BOWEL NUTRITION STATUS      Incontinent Diet(Please see DC Summary)  AMBULATORY STATUS COMMUNICATION OF NEEDS Skin   Extensive Assist Verbally Normal                       Personal Care Assistance Level of Assistance  Bathing, Feeding, Dressing Bathing Assistance: Maximum assistance Feeding assistance: Limited assistance Dressing Assistance: Limited assistance     Functional Limitations Info  Sight, Hearing, Speech Sight Info: Adequate Hearing Info: Adequate Speech Info: Adequate    SPECIAL CARE FACTORS FREQUENCY  PT (By licensed PT), OT (By licensed OT)     PT Frequency: 5x/week OT Frequency: 3x/week            Contractures Contractures Info:  Not present    Additional Factors Info  Code Status, Allergies, Isolation Precautions Code Status Info: Full Allergies Info: Eliquis Apixaban, Demerol Meperidine, Keppra Levetiracetam     Isolation Precautions Info: MRSA in the nose     Current Medications  (05/12/2018):  This is the current hospital active medication list Current Facility-Administered Medications  Medication Dose Route Frequency Provider Last Rate Last Dose  . acetaminophen (TYLENOL) tablet 650 mg  650 mg Oral Q6H PRN Arrien, Jimmy Picket, MD       Or  . acetaminophen (TYLENOL) suppository 650 mg  650 mg Rectal Q6H PRN Arrien, Jimmy Picket, MD      . bisacodyl (DULCOLAX) suppository 10 mg  10 mg Rectal Daily PRN Arrien, Jimmy Picket, MD      . calcium-vitamin D (OSCAL WITH D) 500-200 MG-UNIT per tablet 1 tablet  1 tablet Oral Q breakfast Arrien, Jimmy Picket, MD   1 tablet at 05/12/18 0802  . cephALEXin (KEFLEX) capsule 500 mg  500 mg Oral TID Aline August, MD   500 mg at 05/12/18 1039  . cholecalciferol (VITAMIN D3) tablet 1,000 Units  1,000 Units Oral Daily Tawni Millers, MD   1,000 Units at 05/12/18 [phone]  . diltiazem (CARDIZEM CD) 24 hr capsule 300 mg  300 mg Oral Daily Arrien, Jimmy Picket, MD   300 mg at 05/12/18 0904  . divalproex (DEPAKOTE) DR tablet 250 mg  250 mg Oral Daily Arrien, Jimmy Picket, MD   250 mg at 05/12/18 3710   And  . divalproex (DEPAKOTE) DR tablet 500 mg  500 mg Oral QHS Tawni Millers, MD   500 mg at 05/11/18 2106  . doxazosin (CARDURA) tablet 2 mg  2 mg Oral QPM Arrien, Jimmy Picket, MD   2 mg at 05/11/18 1750  . doxycycline (VIBRA-TABS) tablet 100 mg  100 mg Oral Q12H Alekh, Kshitiz, MD   100 mg at 05/12/18 1039  . enoxaparin (LOVENOX) injection 40 mg  40 mg Subcutaneous Q24H Arrien, Jimmy Picket, MD      . ezetimibe (ZETIA) tablet 5 mg  5 mg Oral QHS Arrien, Jimmy Picket, MD   5 mg at 05/11/18 2105  . feeding supplement (ENSURE ENLIVE) (ENSURE ENLIVE) liquid 237 mL  237 mL Oral BID BM Starla Link, Kshitiz, MD   237 mL at 05/12/18 0907  . feeding supplement (PRO-STAT SUGAR FREE 64) liquid 30 mL  30 mL Oral BID Arrien, Jimmy Picket, MD   30 mL at 05/12/18 0904  . ferrous sulfate tablet 325 mg  325 mg Oral BID WC  Arrien, Jimmy Picket, MD   325 mg at 05/12/18 0802  . fluticasone (FLONASE) 50 MCG/ACT nasal spray 2 spray  2 spray Each Nare Daily PRN Arrien, Jimmy Picket, MD      . HYDROcodone-acetaminophen [facility]) 10-325 MG per tablet 1 tablet  1 tablet Oral Q4H PRN Arrien, Jimmy Picket, MD   1 tablet at 05/12/18 [phone]  . ipratropium-albuterol (DUONEB) 0.5-2.5 (3) MG/3ML nebulizer solution 3 mL  3 mL Nebulization Q4H PRN Arrien, Jimmy Picket, MD      . lactobacillus acidophilus & bulgar (LACTINEX) chewable tablet 1 tablet  1 tablet Oral TID WC Arrien, Jimmy Picket, MD   1 tablet at 05/11/18 1751  . levothyroxine (SYNTHROID, LEVOTHROID) tablet 150 mcg  150 mcg Oral Q0600 Tawni Millers, MD   150 mcg at 05/12/18 [phone]  . methocarbamol (ROBAXIN) tablet 500 mg  500 mg Oral Q6H PRN Arrien, Jimmy Picket, MD      .  metoprolol tartrate (LOPRESSOR) tablet 25 mg  25 mg Oral BID Tawni Millers, MD   25 mg at 05/12/18 0905  . multivitamin with minerals tablet 1 tablet  1 tablet Oral QPM Arrien, Jimmy Picket, MD   1 tablet at 05/11/18 1750  . nitroGLYCERIN (NITROSTAT) SL tablet 0.4 mg  0.4 mg Sublingual Q5 min PRN Arrien, Jimmy Picket, MD      . ondansetron [facility]) tablet 4 mg  4 mg Oral Q6H PRN Arrien, Jimmy Picket, MD       Or  . ondansetron [facility]) injection 4 mg  4 mg Intravenous Q6H PRN Arrien, Jimmy Picket, MD      . pantoprazole (PROTONIX) EC tablet 40 mg  40 mg Oral Daily Tawni Millers, MD   40 mg at 05/12/18 0905  . pravastatin (PRAVACHOL) tablet 40 mg  40 mg Oral QPM Arrien, Jimmy Picket, MD   40 mg at 05/11/18 1750  . predniSONE (DELTASONE) tablet 5 mg  5 mg Oral Q breakfast Arrien, Jimmy Picket, MD   5 mg at 05/12/18 0802  . senna-docusate (Senokot-S) tablet 1 tablet  1 tablet Oral BID Aline August, MD   1 tablet at 05/12/18 0905  . sodium chloride (OCEAN) 0.65 % nasal spray 1 spray  1 spray Each Nare PRN Arrien, Jimmy Picket, MD      .  vitamin B-12 (CYANOCOBALAMIN) tablet 500 mcg  500 mcg Oral QHS Arrien, Jimmy Picket, MD   500 mcg at 05/11/18 2106     Discharge Medications: Please see discharge summary for a list of discharge medications.  Relevant Imaging Results:  Relevant Lab Results:   Additional Information SS#: [id number]  [place], [place]

## 2018-05-12 NOTE — Progress Notes (Signed)
   05/12/18 1000  Clinical Encounter Type  Visited With Patient and family together  Visit Type Initial  Referral From Nurse  Consult/Referral To Chaplain  Spiritual Encounters  Spiritual Needs Emotional;Prayer  Stress Factors  Patient Stress Factors Health changes   Responded to spiritual care consult for an AD. PT was awake and wife was at bedside. Pt requested a copy of the AD and I gave them a brief overview of how to fill it out. Pt stated that he was looking forward to getting discharge today. Family was very thankful for the Chaplain visit. I offered spiritual care with empathic listening, words of comfort, ministry of presence and prayer.  Chaplain Fidel Levy [phone]

## 2018-05-12 NOTE — Patient Outreach (Signed)
[facility]) Care Management  05/12/2018  [name] 1933-09-26 [id number]   CSW noted that patient presented to [facility], from [facility] [place] where patient currently resides to receive short-term rehabilitative services, on Friday, May 09, 2018, due to redness and drainage from the wound site where he underwent a below the knee amputation of the right leg, in January of 2020.  CSW was able to make contact with patient's wife, Roderic Lammert today to obtain an update on patient's medical status.  According to Mrs. Dowe, patient received two broad spectrum intravenous antibiotics for three days for the infected wound, while hospitalized, and will now be required to take two oral antibiotics for one week at [facility].  Mrs. Kyllonen reported that patient has been discharged from the hospital, they are just awaiting the arrival of [facility] ([facility]) to transport patient back to the facility.    Mrs. Basley indicated that patient still does not have a tentative discharge date scheduled at this time, as patient's progress continues to remain slow.  Mrs. Baldo admitted that she is doing all she can to try and help increase patient's appetite, but nothing appears to be working.  Mrs. Wetherbee is hopeful that patient's appetite will somewhat improve, now that patient is not experiencing as much pain from his wound site.  Mrs. Fennema endorses that she still plans to take patient home at time of discharge from [facility], but knows that she will require a great deal of assistance in caring for patient.  Mrs. Spoelstra has already been provided a list of home health agencies, as well as private agency sitters.  CSW agreed to follow-up with patient and Mrs. Enderson again next week, on Monday, May 19, 2018 at 1:00PM.  [name], BSW, MSW, [place]  Licensed Clinical Social Worker  [facility]  Mailing [place] [address] [place], [place] [address] Physical Address-[address], [place], [place] [address] Toll Free Main # [phone] Fax # [phone] Cell # [phone]  Office # [phone] [email or web address]

## 2018-05-12 NOTE — Progress Notes (Addendum)
Patient will DC to: [facility] Anticipated DC date: 05/12/18 Family notified: Spouse at bedside Transport by: [facility] 11:30am   Per MD patient ready for DC to [facility]. RN, patient, patient's family, and facility notified of DC. Discharge Summary and FL2 sent to facility. RN to call report prior to discharge [phone]). DC packet on chart. Ambulance transport requested for patient.   CSW will sign off for now as social work intervention is no longer needed. Please consult [place] again if new needs arise.  Cedric Fishman, LCSW Clinical Social Worker [phone]

## 2018-05-14 LAB — CULTURE, BLOOD (ROUTINE X 2)
Culture: NO GROWTH
Culture: NO GROWTH
Special Requests: ADEQUATE
Special Requests: ADEQUATE

## 2018-05-19 ENCOUNTER — Other Ambulatory Visit: Payer: Self-pay | Admitting: *Deleted

## 2018-05-19 ENCOUNTER — Ambulatory Visit (INDEPENDENT_AMBULATORY_CARE_PROVIDER_SITE_OTHER): Payer: Medicare Other | Admitting: Orthopedic Surgery

## 2018-05-19 NOTE — Patient Outreach (Signed)
[name]) Care Management  05/19/2018  [name] 1933-12-14 [id number]   CSW was able to meet with patient and patient's wife, [name] today to perform a routine skilled nursing visit, at [facility] [place] where patient currently resides to receive short-term rehabilitative services.  Patient reported that he finishes his oral antibiotics today, which he is extremely glad, as he has been having several bouts of diarrhea daily since starting the antibiotics.  Patient's appetite is still diminished and patient has had to hold off on working with therapies (both physical and occupational), due to fatigue, weakness and diarrhea.  Patient still does not have a tentative discharge date scheduled at this time.  CSW agreed to follow-up with Mrs. Hegner again next week, on Monday, May 26, 2018, by phone, to obtain an update on patient, as well as assess and assist with social work needs and services.  [name], BSW, MSW, LCSW  Licensed Education officer, [facility]  Mailing [place] [address] [place], [place] [address] Physical Address-[address], [place], [place] [address] Toll Free Main # [phone] Fax # [phone] Cell # [phone]  Office # [phone] [email or web address]

## 2018-05-21 ENCOUNTER — Ambulatory Visit (INDEPENDENT_AMBULATORY_CARE_PROVIDER_SITE_OTHER): Payer: Medicare Other | Admitting: Orthopedic Surgery

## 2018-05-21 ENCOUNTER — Other Ambulatory Visit: Payer: Self-pay

## 2018-05-21 ENCOUNTER — Encounter (INDEPENDENT_AMBULATORY_CARE_PROVIDER_SITE_OTHER): Payer: Self-pay | Admitting: Family

## 2018-05-21 ENCOUNTER — Ambulatory Visit (INDEPENDENT_AMBULATORY_CARE_PROVIDER_SITE_OTHER): Payer: Medicare Other | Admitting: Family

## 2018-05-21 VITALS — Ht 69.0 in | Wt 155.0 lb

## 2018-05-21 DIAGNOSIS — Z89511 Acquired absence of right leg below knee: Secondary | ICD-10-CM

## 2018-05-21 MED ORDER — SILVER SULFADIAZINE 1 % EX CREA: 1.0000 "application " | TOPICAL_CREAM | Freq: Every day | CUTANEOUS | 0 refills | Status: DC

## 2018-05-26 ENCOUNTER — Other Ambulatory Visit: Payer: Self-pay | Admitting: *Deleted

## 2018-05-26 ENCOUNTER — Ambulatory Visit: Payer: Medicare Other | Admitting: Podiatry

## 2018-05-26 NOTE — Patient Outreach (Signed)
[facility]) Care Management  05/26/2018  ZYKEEM BAUSERMAN Oct 22, 1933 [id number]   CSW was able to make contact with patient's wife, Trevyn Lumpkin today, by phone, to assess and assist with discharge planning needs and services for patient.  Mrs. Gehlhausen reported that she was at the pharmacy at the time of CSW's call, trying to pick up patient's prescription medications and "the essentials", as she is planning to take patient home from [facility] [place] where patient currently resides to receive short-term rehabilitative services, on Wednesday, May 28, 2018.  Mrs. Galik reported that home health services have already been arranged for patient through [facility].  These services include a home health nurse, physical therapist, occupational therapist and an aide.  Mrs. Hannula went on to say that patient has definitely not been receiving the care that he needs at [facility], hence her reason for wanting to try to take him home early.  Mrs. Buchan indicated that she has not been able to visit patient at [facility], due to Flu and Coronavirus restrictions.  Which, in turn, has only made things worse for patient, because she is not able to bring him food, monitor his food intake/consumption, ensure that he is taking his medications properly and encourage him to work with therapies, both physical and occupational.  Mrs. Tarver fears that patient's progress has been effected and knows for a fact that patient has not been receiving his prescription medications in a timely fashion.  Mrs. Raben denied wanting to pursue assisted living placement for patient at this time.  Mrs. Hidalgo confirmed that she still has the list of private agency sitters that CSW provided to her and plans to make arrangements to receive some in-home care assistance with patient, if necessary.  Mrs. Ozbun reported that she has been in constant communication with the  staff at [facility], requesting that patient's physical therapist and occupational therapist mainly work with patient on transitioning him from his wheelchair to his transport chair.  Mrs. Falls's admitted that her biggest concern at this point is being able to get patient transported from her vehicle into the house.  Mrs. Houdeshell has purchased a transfer chair for patient and has recently had a wheelchair ramp installed on the front of her home.  Mrs. Mchugh reported, "Worst case scenario, I call the local fire department to assist me".  Mrs. Rasp believes that she already has all the other necessary durable medical equipment for patient, in place, in the home. Mrs. Nowling has CSW's contact information and has been encouraged to contact CSW directly if additional social work needs arise in the meantime.  Otherwise, CSW will make arrangements to follow-up with patient and Mrs. Seats again on Friday, May 30, 2018, around 9:00AM, to assess ongoing needs, as well as to ensure that patient had a smooth transition home and has everything he needs to be successful.  Mrs. Bratton voiced understanding and was very much in agreement with this plan.  Mrs. Kinsel was extremely grateful for all assistance provided by [facility], thus far.  CSW will place an order for patient to receive an RNCM, also with [facility], to follow patient for community case management services, as well as provide disease specific services and education.  [name], BSW, MSW, LCSW  Licensed Education officer, environmental [facility] System  Mailing [place] [address] [place], [place] [address]  Physical Address-[address], [place], [place] [address] Toll Free Main # [phone] Fax # [phone] Cell # [phone]  Office # [phone] [email or web address]

## 2018-05-27 ENCOUNTER — Other Ambulatory Visit (INDEPENDENT_AMBULATORY_CARE_PROVIDER_SITE_OTHER): Payer: Self-pay

## 2018-05-27 ENCOUNTER — Telehealth (INDEPENDENT_AMBULATORY_CARE_PROVIDER_SITE_OTHER): Payer: Self-pay | Admitting: Family

## 2018-05-27 MED ORDER — SILVER SULFADIAZINE 1 % EX CREA: 1.0000 "application " | TOPICAL_CREAM | Freq: Every day | CUTANEOUS | 0 refills | Status: DC

## 2018-05-27 NOTE — Telephone Encounter (Signed)
Pt was called and re-faxed silvadene to pharmacy of choice. Patient's wife stated that she will be taking her husband out of [facility] due to unsatisfactory results. Patient wife was informed that she can pickup Rx.

## 2018-05-27 NOTE — Telephone Encounter (Signed)
Received voicemail message from patient's wife stating [name] prescribed Silvadene for her husband but, the rehab center used Betadine instead. [name] asked if the Rx for Silvadene can be sent so that she can have some when patient goes home tomorrow. The number to contact patricia is [phone]

## 2018-05-28 ENCOUNTER — Telehealth (INDEPENDENT_AMBULATORY_CARE_PROVIDER_SITE_OTHER): Payer: Self-pay | Admitting: Orthopedic Surgery

## 2018-05-28 ENCOUNTER — Encounter (INDEPENDENT_AMBULATORY_CARE_PROVIDER_SITE_OTHER): Payer: Self-pay | Admitting: Family

## 2018-05-28 NOTE — Progress Notes (Signed)
Post-Op Visit Note   Patient: [name]           Date of Birth: September 07, 1933           MRN: [id number] Visit Date: 05/21/2018 PCP: [name], MD  Chief Complaint:  Chief Complaint  Patient presents with  . Right Leg - Routine Post Op    03/28/2018 right BKA; 05/09/2018 right BKA infection D/C from ED; Picture taken    HPI:  HPI The patient is an 85 year old gentleman who is seen today status post right below the knee amputation on January 17.  Did have an infection on February 28 of this year as well.  This is been treated with Keflex as well as doxycycline.  He is accompanied by his wife.  Is residing at [facility].  They are concerned about wound care the nursing staff has been doing Betadine and dry dressings.  Ortho Exam On examination there are 3 open areas to his incision which is otherwise otherwise well-healed.  There is exposed suture material in these this was removed with pickups.  There was moderate bleeding there is no purulence no odor no edema no cellulitis.  Visit Diagnoses:  1. S/P BKA (below knee amputation) unilateral, right [facility])     Plan: Hope that this will heal up uneventfully now that suture removal has been removed.  She was provided with instructions to return to [facility].  They will continue daily Dial soap cleansing.  Discussed Silvadene dressing changes daily.  Over the eschar to the medial aspect of the incision.  Follow-up in the office in 2 weeks.  Follow-Up Instructions: Return in about 2 weeks (around 06/04/2018).   Imaging: No results found.  Orders:  No orders of the defined types were placed in this encounter.  Meds ordered this encounter  Medications  . DISCONTD: silver sulfADIAZINE (SILVADENE) 1 % cream    Sig: Apply 1 application topically daily.    Dispense:  50 g    Refill:  0     PMFS History: Patient Active Problem List   Diagnosis Date Noted  . Wound infection 05/09/2018  . AKI (acute kidney injury) ([place])   . S/P  BKA (below knee amputation) unilateral, right ([place])   . Unilateral complete BKA, right, initial encounter ([place])   . Tachypnea   . Post-operative pain   . Subacute osteomyelitis, right ankle and foot ([place])   . Paralysis of right lower extremity ([place])   . TIA (transient ischemic attack) 03/24/2018  . Encephalopathy 03/24/2018  . Cellulitis 03/15/2018  . Hypernatremia 03/15/2018  . Persistent atrial fibrillation 11/10/2017  . Severe muscle deconditioning 11/10/2017  . Hemorrhage 10/22/2017  . CAD S/P percutaneous coronary angioplasty 10/22/2017  . Acute hypoxemic respiratory failure ([place]) 07/17/2017  . Aspiration syndrome, subsequent encounter 07/11/2017  . Aspiration pneumonia ([place]) 07/01/2017  . Acute encephalopathy 07/01/2017  . Thrombocytopenia ([place]) 07/01/2017  . Chronic diastolic (congestive) heart failure ([place]) 07/01/2017  . Anemia of chronic disease 07/01/2017  . Troponin level elevated 07/01/2017  . Chronic atrial fibrillation 07/01/2017  . Goals of care, counseling/discussion   . Palliative care by specialist   . Acute metabolic encephalopathy 52/77/8242  . CKD (chronic kidney disease) stage 3, GFR 30-59 ml/min (HCC)   . CAP (community acquired pneumonia) 04/17/2017  . Hallux rigidus, right foot 03/10/2016  . Lumbosacral spondylosis without myelopathy 10/29/2015  . Memory difficulty 09/22/2015  . Abnormality of gait 09/22/2015  . Hyperglycemia 07/06/2015  . Chronic pain 07/06/2015  .  Chronic insomnia 03/30/2015  . Transient alteration of awareness 03/30/2015  . Abnormal liver function   . Altered mental status 01/31/2015  . Essential hypertension 01/31/2015  . Constipation 01/31/2015  . Hypothyroidism 01/31/2015  . Seizure disorder ([place]) 01/31/2015  . Bladder outlet obstruction 01/31/2015  . GERD (gastroesophageal reflux disease) 01/31/2015  . Chronic back pain 01/31/2015  . Acute kidney injury ([place]) 01/31/2015   Past Medical History:  Diagnosis Date  .  Abnormality of gait 09/22/2015  . Arthritis   . Atrial fibrillation ([place])   . CAD (coronary artery disease)    Stent to RCA, Penta stent, 99% reduced to 0% 2002.  . Cancer ([place])    skin CA removed from back  . Chronic insomnia 03/30/2015  . Complication of anesthesia    trouble waking up  . GERD (gastroesophageal reflux disease)   . Hypercholesteremia   . Hypertension   . Hypothyroidism   . Memory difficulty 09/22/2015  . Osteoarthritis   . Seizures ([place])   . Sepsis ([place]) 05/2017  . Transient alteration of awareness 03/30/2015  . Vertigo    hx of    Family History  Problem Relation Age of Onset  . Hypertension Mother   . Cancer Mother   . Kidney failure Father   . Heart disease Father     Past Surgical History:  Procedure Laterality Date  . AMPUTATION Right 03/28/2018   Procedure: AMPUTATION BELOW KNEE;  Surgeon: Newt Minion, MD;  Location: [facility];  Service: Orthopedics;  Laterality: Right;  . BACK SURGERY    . EYE SURGERY     Bilateral Cataract surgery   . HERNIA REPAIR    . I&D EXTREMITY Right 05/10/2015   Procedure: IRRIGATION AND DEBRIDEMENT EXTREMITY;  Surgeon: Leanora Cover, MD;  Location: [facility];  Service: Orthopedics;  Laterality: Right;  . KNEE ARTHROPLASTY     right knee X 2; left knee once  . LAMINECTOMY     X 6  . POSTERIOR CERVICAL FUSION/FORAMINOTOMY  01/28/2012   Procedure: POSTERIOR CERVICAL FUSION/FORAMINOTOMY LEVEL 3;  Surgeon: [name], MD;  Location: [facility];  Service: Neurosurgery;  Laterality: Left;  Posterior Cervical Five-Thoracic One Fusion, Arthrodesis with LEFT Cervical Seven-thoracic One Laminectomy, Foraminotomy and Resection of Synovial Cyst  . POSTERIOR CERVICAL FUSION/FORAMINOTOMY N/A 01/29/2013   Procedure: POSTERIOR CERVICAL FUSION/FORAMINOTOMY LEVEL 1 and C2-5 Posteriolateral Arthrodesis;  Surgeon: [name], MD;  Location: [facility];  Service: Neurosurgery;  Laterality: N/A;  C2-C3 Laminectomy  C2-C3 posterior cervical arthrodesis  . TONSILLECTOMY     Social History   Occupational History  . Occupation: retired Software engineer  Tobacco Use  . Smoking status: Former Research scientist (life sciences)  . Smokeless tobacco: Never Used  Substance and Sexual Activity  . Alcohol use: Yes    Comment: rare  . Drug use: No  . Sexual activity: Not Currently

## 2018-05-28 NOTE — Telephone Encounter (Signed)
Patient's wife called stating that the pharmacy has not received the RX for the Silvadene that she is needing to dress his wound.  She requests that this RX be sent to the [facility] on [address].  CB#[phone].  She needs to pick this up today.  Thank you.

## 2018-05-29 ENCOUNTER — Telehealth (INDEPENDENT_AMBULATORY_CARE_PROVIDER_SITE_OTHER): Payer: Self-pay

## 2018-05-29 ENCOUNTER — Other Ambulatory Visit: Payer: Self-pay

## 2018-05-29 ENCOUNTER — Other Ambulatory Visit (INDEPENDENT_AMBULATORY_CARE_PROVIDER_SITE_OTHER): Payer: Self-pay

## 2018-05-29 MED ORDER — SILVER SULFADIAZINE 1 % EX CREA: 1.0000 "application " | TOPICAL_CREAM | Freq: Every day | CUTANEOUS | 0 refills | Status: DC

## 2018-05-29 NOTE — Telephone Encounter (Signed)
Gala Murdoch RN at [facility] would like verbal orders for Home Health?  Stated that patient would need HHN, HHPT, and Occupational Therapy.  Would like to discuss medications?  CB# is [phone].  Please advise.  Thank you.

## 2018-05-29 NOTE — Telephone Encounter (Signed)
I called and gave verbal ok for HHPT, OT and HHN to wash BKA with dial soap and water and to apply silvadene to eschar along incision daily. Pt has a follow up appt on 06/04/18 will update orders at that time. Pt advised HHN that he was not going to take the medications that he was given at the SNF and was going to go back to what he was taking prior to surgery. Advised that the pt should follow up with PCP [name] and discuss medications.

## 2018-05-29 NOTE — Telephone Encounter (Signed)
Called pt to advise that rx has been faxed to the pharmacy.

## 2018-05-29 NOTE — Patient Outreach (Addendum)
[facility] [facility]) Care Management  05/29/2018  [name] [birth year] [id number]    Current Medications:  Current Outpatient Medications  Medication Sig Dispense Refill  . alendronate (FOSAMAX) 70 MG tablet Take 70 mg by mouth every Wednesday. Remain upright for 30-60 minutes.    . Amino Acids-Protein Hydrolys (FEEDING SUPPLEMENT, PRO-STAT SUGAR FREE 64,) LIQD Take 30 mLs by mouth 2 (two) times daily. 900 mL 0  . aspirin EC 81 MG tablet Take 81 mg by mouth daily.    . bisacodyl (DULCOLAX) 10 MG suppository Place 1 suppository (10 mg total) rectally daily as needed for moderate constipation. 12 suppository 0  . calcium carbonate (OSCAL) 1500 (600 Ca) MG TABS tablet Take 600 mg of elemental calcium by mouth daily after breakfast.    . Cholecalciferol (VITAMIN D) 125 MCG (5000 UT) CAPS Take 1 capsule by mouth daily.    . collagenase (SANTYL) ointment Apply 1 application topically daily as needed.    . cyanocobalamin 500 MCG tablet Take 500 mcg by mouth at bedtime. Vitamin B12    . diclofenac sodium (VOLTAREN) 1 % GEL Apply 2 g topically daily as needed (pain).    Marland Kitchen diltiazem (CARDIZEM CD) 240 MG 24 hr capsule Take 240 mg every morning   Hold if systolic blood pressure is less than 100. 90 capsule 3  . divalproex (DEPAKOTE) 250 MG DR tablet Take 250-500 mg by mouth See admin instructions. Take one tablet (250 mg) by mouth ever morning and two tablets (500 mg) every evening (for epilepsy)    . docusate sodium (COLACE) 100 MG capsule Take 100 mg by mouth 2 (two) times daily as needed for mild constipation.    Marland Kitchen ezetimibe (ZETIA) 10 MG tablet Take 5 mg by mouth at bedtime.    . ferrous sulfate 325 (65 FE) MG tablet Take 1 tablet (325 mg total) by mouth 2 (two) times daily with a meal. 60 tablet 0  . fluticasone (FLONASE) 50 MCG/ACT nasal spray Place 2 sprays into both nostrils daily as needed for allergies or rhinitis.    . furosemide (LASIX) 20 MG tablet Take 20 mg by mouth daily.     Marland Kitchen HYDROcodone-acetaminophen (NORCO) 10-325 MG tablet Take 1 tablet by mouth every 4 (four) hours as needed for moderate pain.    Marland Kitchen levothyroxine (SYNTHROID, LEVOTHROID) 150 MCG tablet Take 150 mcg by mouth daily before breakfast.    . Lidocaine (ASPERCREME LIDOCAINE) 4 % PTCH Apply 1 patch topically 2 (two) times daily as needed.     . lidocaine (LIDODERM) 5 % Place 1 patch onto the skin daily as needed. Remove & Discard patch within 12 hours or as directed by MD    . metoprolol tartrate (LOPRESSOR) 25 MG tablet Take 1 tablet (25 mg total) by mouth 2 (two) times daily. Take with or immediately following a meal. (Patient taking differently: Take 25 mg by mouth 2 (two) times daily with a meal. Hold med for SBP less than 90 and DBP less than 50 Per Dr. Sallyanne Kuster) 180 tablet 3  . Multiple Vitamins-Minerals (CERTAVITE/ANTIOXIDANTS) TABS Take 1 tablet by mouth every evening. With Iron 18 mg/ folic acid 409 mcg    . nitroGLYCERIN (NITROSTAT) 0.4 MG SL tablet Place 1 tablet (0.4 mg total) under the tongue every 5 (five) minutes as needed for chest pain (call 911 afer 3 doses and if chest pain persists). 25 tablet 11  . omeprazole (PRILOSEC) 20 MG capsule Take 20 mg by mouth daily before  breakfast.     . polyethylene glycol (MIRALAX / GLYCOLAX) packet Take 17 g by mouth daily as needed for mild constipation. 14 each 0  . potassium chloride (MICRO-K) 10 MEQ CR capsule Take 20 mEq by mouth daily.    . pravastatin (PRAVACHOL) 40 MG tablet Take 40 mg by mouth every evening.   2  . predniSONE (DELTASONE) 5 MG tablet Take 2 tablets (10 mg total) by mouth daily with breakfast.    . tamsulosin (FLOMAX) 0.4 MG CAPS capsule Take 1 capsule (0.4 mg total) by mouth at bedtime. 30 capsule 6    No current facility-administered medications for this visit.     Functional Status:  In your present state of health, do you have any difficulty performing the following activities: 06/03/2018 05/29/2018  Hearing? N -  Vision? N  -  Difficulty concentrating or making decisions? Y -  Comment member's spouse assist -  Walking or climbing stairs? - Y  Dressing or bathing? (No Data) Y  Comment member's spuse assists and member has [name] home health PT/OT -  Doing errands, shopping? (No Data) Y  Comment member's spuse assists and member has [name] home health PT/O -  Preparing Food and eating ? (No Data) Y  Comment spouse assists with meals -  Using the Toilet? (No Data) Y  Comment member with recent BLE amputation and spouse assists with all care -  In the past six months, have you accidently leaked urine? Y -  Comment member with recent BLE amputation and spouse assists with all care -  Do you have problems with loss of bowel control? Y -  Comment member with recent BLE amputation and spouse assists with all care -  Managing your Medications? (No Data) Y  Comment Spouse assists. Pharmacy referral placed last week -  Managing your Finances? (No Data) Y  Comment spouse provides assistance -  Housekeeping or managing your Housekeeping? (No Data) Y  Comment spouse provides assistance -  Some recent data might be hidden    Fall/Depression Screening: Fall Risk  06/03/2018 04/07/2018 09/04/2017  Falls in the past year? 1 1 No  Number falls in past yr: 0 1 -  Injury with Fall? 0 1 -  Risk for fall due to : Impaired mobility;Impaired vision;Impaired balance/gait History of fall(s);Impaired balance/gait;Impaired mobility -  Follow up Falls evaluation completed;Education provided;Falls prevention discussed Education provided;Falls prevention discussed -  Comment Member and spouse stated that member slid from his w/c to floor. Both deny any injuries, denies hitting head, denies f/u with MD or medical team. Wife [name] stated that [name] PT on phone at that time and came to home to assist family and provided Ed  - -   [name] 2/9 Scores 06/03/2018 04/07/2018 09/04/2017  PHQ - 2 Score 2 0 0  PHQ- 9 Score 4 - -    Subjective:  Received Referral from [name] on 03-20-2018 for [name]; however, member discharged to [facility] for short term rehab. Member discharged from SNF on 05-28-2018 to home. RN CM will complete TOC.  Objective: Member with 3 hospitalizations in last 6 months. Member with recent hospitalization from 03-15-2018 to 1-9-202 at [name] for CHF and AKI. Member readmitted to hospital on 03-24-2018 to 04-02-2018 due to R BKA the discharged to SNF. Member readmitted again to hospital due to R BKA infection from 05-09-2018 to  05-12-2018 and then completed rehab at [name].   PMH: HTN/; CHF; AFIB with  RVR; CAD s/p angioplasty; CAP; Acute Hypoxemic Respiratory failure; GERD; Seizure d/o; R BKA; CKD-5; Anemia; Memory deficit; GERD; Insomnia; Hypothyroidism  Called member at [employer] preferred number and member's spouse [name] answered phone and HIPPA identifiers confirmed. Introduced self and stated reason for call. [facility] Care Coordinated services explained and member's spouse [name] verbalized acceptance of services.   Spouse was able to assist with completion of some of the initial assessment information; however, states need to go to the pharmacy for Silvadene medication as [place] Nurse will arrive soon to complete member's dressing change to R BKA.  [name] stated that she called EMS to assist with transfer into home when member arrived at home and stated that today, she was able to assist member out of bed. [name] stated that [facility] home care PT and OT will come to evaluate member as well. [name] requesting for [facility] Pharmacist to come assist with member's medications.   [name] denies current problems at home with member at current time. Provided Patrica with RN CM contact information  RN CM will call next week to complete initial assessment. Will send [place] referral today.   THN CM Care Plan Problem One     Most Recent Value  Care Plan  Problem One  Risk for readmission r/t recent hospitalization  Role Documenting the Problem One  Care Management [place] for Problem One  Active  THN Long Term Goal   Member will not be readmitted within next 31 days  THN Long Term Goal Start Date  05/29/18  Interventions for Problem One Long Term Goal  Discussed [facility] home health current assistance for Nurse, PT, OT  THN CM Short Term Goal #1   member will take medications as prescribed for next 30 days  THN CM Short Term Goal #1 Start Date  05/29/18  Interventions for Short Term Goal #1  Discussed whether or not member has all medications  THN CM Short Term Goal #2   Member will remain free from s/s of infection at R BKA within next 30 days  THN CM Short Term Goal #2 Start Date  05/29/18  Interventions for Short Term Goal #2  Discussed with spouse concerning who will provide wound care for member    [name] CM Care Plan Problem Two     Most Recent Value  Care Plan Problem Two  Knowledge Deficit related to CHF as evidenced by referral received for CHF   Role Documenting the Problem Two  Care Management Coordinator  Care Plan for Problem Two  Active  Interventions for Problem Two Long Term Goal   Assessed for s/s of swelling, SOB,  Discussed d/c orders  THN Long Term Goal  Member wil not experience exacerbation of CHF for next 31 days  THN Long Term Goal Start Date  05/29/18  [name] Short Term Goal #1   family will discuss plans for weighing member daily within next 2 weeks  THN CM Short Term Goal #1 Start Date  05/29/18  Interventions for Short Term Goal #2   Confirmed follow up call next week to continue to discuss interventions for member at home     [place] "ANN" [name], RN-BSN  [place] Management Coordinator  [phone] [email or web address]

## 2018-05-30 ENCOUNTER — Encounter: Payer: Self-pay | Admitting: *Deleted

## 2018-05-30 ENCOUNTER — Other Ambulatory Visit: Payer: Self-pay | Admitting: *Deleted

## 2018-05-30 ENCOUNTER — Other Ambulatory Visit: Payer: Self-pay | Admitting: Pharmacist

## 2018-05-30 ENCOUNTER — Ambulatory Visit: Payer: Self-pay | Admitting: Pharmacist

## 2018-05-30 NOTE — Patient Outreach (Signed)
[facility]) Care Management  05/30/2018  [name] January 02, 1934 [id number]   CSW was able to make contact with patient's wife, [name] today to follow-up regarding patient's recent discharge from [facility] [place] where patient was residing to receive short-term rehabilitative services.  Mrs. Wall admitted that things have been a bit hectic since bringing patient home, but that they are settling into a nice routine.  Mrs. Meaders was unable to converse with CSW for long, indicating that she and patient had a follow-up call scheduled with patient's Primary Care Physician, Dr. [name], today (Friday, May 30, 2018) at 11:00AM.  Mrs. Meenan indicated that she does not intend to leave her home, especially with patient, unless it is an emergent situation.  Mrs. Mcclurg reported that home health services are going well through [facility] and that patient appears to have made great progress with physical and occupational therapies while residing at [facility].  Mrs. Mucha admitted that she did end up having to call EMS (Emergency Medical Services) to assist her with transporting patient from her vehicle into the home on Wednesday, May 28, 2018, upon patient being discharged from [facility], as patient was simply too weak to try and assist with transfers.  Mrs. Nunes indicated that she will rely on [facility], as well as [facility] ([place]) to assist her with transporting patient to and from his physician appointments.  CSW will perform a case closure on patient, as all goals of treatment have been met from social work standpoint and no additional social work needs have been identified at this time.  CSW will notify patient's RNCM with [facility] Management, Thressa Sheller of CSW's plans to close patient's case.  CSW will fax an update to patient's Primary Care Physician,  Dr. [name] to ensure that they are aware of CSW's involvement with patient's plan of care.  CSW was able to confirm that Mrs. Grennan has the correct contact information for CSW, encouraging Mrs. Binion to contact CSW directly if additional social work needs arise in the near future.  Mrs. Lipton voiced understanding and was agreeable to this plan.  [name], BSW, MSW, LCSW  Licensed Education officer, [facility]  Mailing [employer] [address], [place], [place] [address] Physical Address-[address], [place], [place] [address] Toll Free Main # [phone] Fax # [phone] Cell # [phone]  Office # [phone] [email or web address]

## 2018-05-30 NOTE — Patient Outreach (Signed)
[facility]) Care Management  [place]  05/30/2018  KENRIC GINGER December 25, 1933 [id number]   Reason for call: medication management  Outreach:  Unsuccessful telephone call attempt to patient's wife.  [name] answered the phone in a hurry and stated "please call back later".  Unable to speak, however will return her call later per her request  Plan:  I will make another outreach attempt to patient within 3-4 business days   Regina Eck, PharmD, [place]  [phone]

## 2018-06-02 ENCOUNTER — Telehealth (INDEPENDENT_AMBULATORY_CARE_PROVIDER_SITE_OTHER): Payer: Self-pay | Admitting: Family

## 2018-06-02 NOTE — Telephone Encounter (Signed)
I called and sw pt's wife to advise that he needs to keep appt. Wife had questions about some changes to the amputation and if to continue with dressings. Advised to keep doing what she has been doing and will evaluate and make changes if needed.

## 2018-06-02 NOTE — Telephone Encounter (Signed)
Patient's wife called stating that he has an appointment with Junie Panning on Wednesday and wants to know if he really needs to come in for that appointment.  CB#[phone].  Thank you.

## 2018-06-02 NOTE — Telephone Encounter (Signed)
Yes lets see him

## 2018-06-02 NOTE — Telephone Encounter (Signed)
Pt is s/p a right BKA 03/2018 with infection seen in ER 05/09/2018 he did follow up with [place] after that. Pt is asking if he needs to keep his appt this week or ok to sch out a few weeks.

## 2018-06-03 ENCOUNTER — Other Ambulatory Visit: Payer: Self-pay

## 2018-06-03 ENCOUNTER — Telehealth (INDEPENDENT_AMBULATORY_CARE_PROVIDER_SITE_OTHER): Payer: Self-pay | Admitting: Orthopedic Surgery

## 2018-06-03 NOTE — Patient Outreach (Addendum)
[facility]) Care Management  06/03/2018  [name] 1933-08-10 [id number]  Received Referral from [place] on 03-20-2018 for [facility]; however, member discharged to [facility] for short term rehab. Member discharged from SNF on 05-28-2018 to home. RN CM will complete TOC.  Member with 3 hospitalizations in last 6 months. Member with recent hospitalization from 03-15-2018 to 1-9-202 at [facility] for CHF and AKI. Member readmitted to hospital on 03-24-2018 to 04-02-2018 due to R BKA the discharged to SNF. Member readmitted again to hospital due to R BKA infection from 05-09-2018 to  05-12-2018 and then completed rehab at [facility].   PMH: HTN/; CHF; AFIB with RVR; CAD s/p angioplasty; CAP; Acute Hypoxemic Respiratory failure; GERD; Seizure d/o; R BKA; CKD-5; Anemia; Memory deficit; GERD; Insomnia; Hypothyroidism  Called member at [employer] preferred number and member's spouse [name] answered phone. RN CM asked if could speak to member briefly and member on phone. HIPPA identity verified.   Fall: Member stated that he feels well. Member stated that he slid from his w/c to floor. Both member and member's spouse deny any injuries, denies hitting head, denies f/u with MD or medical team. Wife [name] stated that [name] on phone at that time and came to home to assist family and provided education.  Home Care: [facility] home care Nurse following 1-2 times a week for dressing changes and member's spouse [name] states she has been educated on how to care for wound and she provides wound care as well. Family denies s/s of infection. [facility] PT/OT ordered; however, [name] stated she will limit OT visits as she does not want many visitors coming to the home at this time.   [name] states that she will practice today, rolling member down ramp. Educated spouse back down ramp instead of going forward. Encouraged [name] to call and request assistance  from family/friend/EMS  that she will attempt this; therefore, stand by assistance will be available. [name] verbalized agreement and understanding.   Appointments: Family states that member has appointment tomorrow with [employer].   Family continues to request [facility] Pharmacist to follow up for medication assistance via phone call. Family made aware that pharmacist did attempt to call last week and [name] stated she will return call to pharmacist.   Family denies any further needs at this time. Will send Initial Assessment to PCP to include Barriers Letter, Fall, Depression, Care Plan, medication list, Quarterly update.Will send EMMI fall prevention and Infection prevention education, [facility] Calendar, Business card to [facility] home. Will follow up with family next week.   THN CM Care Plan Problem One     Most Recent Value  Care Plan Problem One  Risk for readmission r/t recent hospitalization  Role Documenting the Problem One  Care Management [place] for Problem One  Active  THN Long Term Goal   Member will not be readmitted within next 31 days  THN Long Term Goal Start Date  05/29/18  [facility] CM Short Term Goal #1   member will take medications as prescribed for next 30 days  THN CM Short Term Goal #1 Start Date  05/29/18  [facility] CM Short Term Goal #2   Member will remain free from s/s of infection at R BKA within next 30 days  THN CM Short Term Goal #2 Start Date  05/29/18    [facility] CM Care Plan Problem Two     Most Recent Value  Care Plan Problem  Two  Knowledge Deficit related to CHF as evidenced by referral received for CHF   Role Documenting the Problem Two  Care Management [place] for Problem Two  Active  THN Long Term Goal  Member wil not experience exacerbation of CHF for next 31 days  THN Long Term Goal Start Date  05/29/18  [facility] CM Short Term Goal #1   family will discuss plans for weighing member daily within next 2 weeks  [facility] CM Short Term Goal #1  Start Date  05/29/18     Benjamine MolaANN" [name], [place] Management  Community Care Management Coordinator  [phone] [email or web address]

## 2018-06-03 NOTE — Telephone Encounter (Signed)
Don with OT called stating caregiver declined OT at this time.  [name] is sending over discharge papers

## 2018-06-04 ENCOUNTER — Encounter (INDEPENDENT_AMBULATORY_CARE_PROVIDER_SITE_OTHER): Payer: Self-pay | Admitting: Orthopedic Surgery

## 2018-06-04 ENCOUNTER — Ambulatory Visit (INDEPENDENT_AMBULATORY_CARE_PROVIDER_SITE_OTHER): Payer: Medicare Other | Admitting: Orthopedic Surgery

## 2018-06-04 ENCOUNTER — Ambulatory Visit: Payer: Self-pay | Admitting: Pharmacist

## 2018-06-04 ENCOUNTER — Other Ambulatory Visit: Payer: Self-pay | Admitting: Pharmacist

## 2018-06-04 VITALS — Ht 69.0 in | Wt 154.0 lb

## 2018-06-04 DIAGNOSIS — Z89511 Acquired absence of right leg below knee: Secondary | ICD-10-CM

## 2018-06-04 NOTE — Patient Outreach (Signed)
[facility]) Care Management  06/04/2018  [name] January 18, 1934 [id number]  Successful outreach to patient's wife [name].  She is mailing me an up to date list of patient's medications from [facility].  New RXs were written from his stay there.  He was seen by providers from [facility].  His PCP is Dr. Philip [name] and still sees Dr. Jeni [name] for cardiolgy.  His newest specialist is Dr. Delice [name] with [facility] to address his depakote. Will collaborate with all providers responsible for his care and reconcile patient's medications.  PLAN: -I will follow up to reconcile medications as list becomes available.  Regina Eck, PharmD, [place]  [phone]

## 2018-06-05 ENCOUNTER — Ambulatory Visit: Payer: Self-pay | Admitting: Pharmacist

## 2018-06-05 ENCOUNTER — Encounter (INDEPENDENT_AMBULATORY_CARE_PROVIDER_SITE_OTHER): Payer: Self-pay | Admitting: Orthopedic Surgery

## 2018-06-05 ENCOUNTER — Telehealth (INDEPENDENT_AMBULATORY_CARE_PROVIDER_SITE_OTHER): Payer: Self-pay | Admitting: Orthopedic Surgery

## 2018-06-05 ENCOUNTER — Other Ambulatory Visit: Payer: Self-pay

## 2018-06-05 NOTE — Progress Notes (Signed)
Office Visit Note   Patient: [name]           Date of Birth: 10-Aug-1933           MRN: [id number] Visit Date: 06/04/2018              Requested by: [name], [place], [place] [address] PCP: [name], MD  Chief Complaint  Patient presents with  . Right Leg - Routine Post Op    03/28/2018 right I&D      HPI: Patient is an 85 year old gentleman who presents 2 months status post right transtibial amputation.  Patient states that he was in the emergency room last month for an infection.  Currently using Silvadene dressing changes with a shrinker and limb protector.  He is currently off his antibiotics.  Assessment & Plan: Visit Diagnoses:  1. S/P BKA (below knee amputation) unilateral, right ([place])     Plan: Patient's leg continues to show improvement.  He has 1 area of granulation tissue 5 mm in diameter.  He will continue with the stump shrinker and limb protector.  Follow-Up Instructions: Return in about 4 weeks (around 07/02/2018).   Ortho Exam  Patient is alert, oriented, no adenopathy, well-dressed, normal affect, normal respiratory effort. Examination patient has good consolidation of the residual limb he has 1 small area of granulation tissue there is no cellulitis no drainage no odor no signs of infection.  Imaging: No results found. No images are attached to the encounter.  Labs: Lab Results  Component Value Date   HGBA1C 5.7 (H) 03/26/2018   HGBA1C 5.0 07/12/2017   HGBA1C 5.8 (H) 07/06/2015   ESRSEDRATE 24 (H) 03/24/2018   ESRSEDRATE 15 04/18/2017   ESRSEDRATE 5 09/17/2007   CRP 3.1 (H) 05/11/2018   CRP 1.8 (H) 03/24/2018   CRP <0.8 04/24/2017   LABURIC 6.0 03/09/2016   REPTSTATUS 05/14/2018 FINAL 05/09/2018   GRAMSTAIN  07/15/2017    WBC PRESENT,BOTH PMN AND MONONUCLEAR NO ORGANISMS SEEN Performed at [facility], [place] [address] [place], [place] [address]    CULT  05/09/2018    NO GROWTH 5 DAYS Performed  at [facility] [address], [place], [place] [address]    LABORGA ESCHERICHIA COLI (A) 09/14/2017     Lab Results  Component Value Date   ALBUMIN 3.0 (L) 05/09/2018   ALBUMIN 2.5 (L) 03/31/2018   ALBUMIN 2.7 (L) 03/30/2018   PREALBUMIN 36 (H) 11/22/2017   PREALBUMIN 28.8 04/24/2017   LABURIC 6.0 03/09/2016    Body mass index is 22.74 kg/m.  Orders:  No orders of the defined types were placed in this encounter.  No orders of the defined types were placed in this encounter.    Procedures: No procedures performed  Clinical Data: No additional findings.  ROS:  All other systems negative, except as noted in the HPI. Review of Systems  Objective: Vital Signs: Ht 5\' 9"  (1.753 m)   Wt 154 lb (69.9 kg)   BMI 22.74 kg/m   Specialty Comments:  No specialty comments available.  PMFS History: Patient Active Problem List   Diagnosis Date Noted  . Wound infection 05/09/2018  . AKI (acute kidney injury) ([place])   . S/P BKA (below knee amputation) unilateral, right ([place])   . Unilateral complete BKA, right, initial encounter ([place])   . Tachypnea   . Post-operative pain   . Subacute osteomyelitis, right ankle and foot ([place])   . Paralysis of  right lower extremity ([place])   . TIA (transient ischemic attack) 03/24/2018  . Encephalopathy 03/24/2018  . Cellulitis 03/15/2018  . Hypernatremia 03/15/2018  . Persistent atrial fibrillation 11/10/2017  . Severe muscle deconditioning 11/10/2017  . Hemorrhage 10/22/2017  . CAD S/P percutaneous coronary angioplasty 10/22/2017  . Acute hypoxemic respiratory failure ([place]) 07/17/2017  . Aspiration syndrome, subsequent encounter 07/11/2017  . Aspiration pneumonia ([place]) 07/01/2017  . Acute encephalopathy 07/01/2017  . Thrombocytopenia ([place]) 07/01/2017  . Chronic diastolic (congestive) heart failure ([place]) 07/01/2017  . Anemia of chronic disease 07/01/2017  . Troponin level elevated 07/01/2017  . Chronic atrial fibrillation  07/01/2017  . Goals of care, counseling/discussion   . Palliative care by specialist   . Acute metabolic encephalopathy 67/61/9509  . CKD (chronic kidney disease) stage 3, GFR 30-59 ml/min (HCC)   . CAP (community acquired pneumonia) 04/17/2017  . Hallux rigidus, right foot 03/10/2016  . Lumbosacral spondylosis without myelopathy 10/29/2015  . Memory difficulty 09/22/2015  . Abnormality of gait 09/22/2015  . Hyperglycemia 07/06/2015  . Chronic pain 07/06/2015  . Chronic insomnia 03/30/2015  . Transient alteration of awareness 03/30/2015  . Abnormal liver function   . Altered mental status 01/31/2015  . Essential hypertension 01/31/2015  . Constipation 01/31/2015  . Hypothyroidism 01/31/2015  . Seizure disorder ([place]) 01/31/2015  . Bladder outlet obstruction 01/31/2015  . GERD (gastroesophageal reflux disease) 01/31/2015  . Chronic back pain 01/31/2015  . Acute kidney injury ([place]) 01/31/2015   Past Medical History:  Diagnosis Date  . Abnormality of gait 09/22/2015  . Arthritis   . Atrial fibrillation ([place])   . CAD (coronary artery disease)    Stent to RCA, Penta stent, 99% reduced to 0% 2002.  . Cancer ([place])    skin CA removed from back  . Chronic insomnia 03/30/2015  . Complication of anesthesia    trouble waking up  . GERD (gastroesophageal reflux disease)   . Hypercholesteremia   . Hypertension   . Hypothyroidism   . Memory difficulty 09/22/2015  . Osteoarthritis   . Seizures ([place])   . Sepsis ([place]) 05/2017  . Transient alteration of awareness 03/30/2015  . Vertigo    hx of    Family History  Problem Relation Age of Onset  . Hypertension Mother   . Cancer Mother   . Kidney failure Father   . Heart disease Father     Past Surgical History:  Procedure Laterality Date  . AMPUTATION Right 03/28/2018   Procedure: AMPUTATION BELOW KNEE;  Surgeon: Newt Minion, MD;  Location: [facility];  Service: Orthopedics;  Laterality: Right;  . BACK SURGERY    . EYE SURGERY      Bilateral Cataract surgery   . HERNIA REPAIR    . I&D EXTREMITY Right 05/10/2015   Procedure: IRRIGATION AND DEBRIDEMENT EXTREMITY;  Surgeon: Leanora Cover, MD;  Location: [facility];  Service: Orthopedics;  Laterality: Right;  . KNEE ARTHROPLASTY     right knee X 2; left knee once  . LAMINECTOMY     X 6  . POSTERIOR CERVICAL FUSION/FORAMINOTOMY  01/28/2012   Procedure: POSTERIOR CERVICAL FUSION/FORAMINOTOMY LEVEL 3;  Surgeon: [name], MD;  Location: [facility];  Service: Neurosurgery;  Laterality: Left;  Posterior Cervical Five-Thoracic One Fusion, Arthrodesis with LEFT Cervical Seven-thoracic One Laminectomy, Foraminotomy and Resection of Synovial Cyst  . POSTERIOR CERVICAL FUSION/FORAMINOTOMY N/A 01/29/2013   Procedure: POSTERIOR CERVICAL FUSION/FORAMINOTOMY LEVEL 1 and C2-5 Posteriolateral Arthrodesis;  Surgeon: [name], MD;  Location: [facility];  Service: Neurosurgery;  Laterality: N/A;  C2-C3 Laminectomy C2-C3 posterior cervical arthrodesis  . TONSILLECTOMY     Social History   Occupational History  . Occupation: retired Software engineer  Tobacco Use  . Smoking status: Former Research scientist (life sciences)  . Smokeless tobacco: Never Used  Substance and Sexual Activity  . Alcohol use: Not Currently    Comment: rare  . Drug use: No  . Sexual activity: Not Currently

## 2018-06-05 NOTE — Telephone Encounter (Signed)
Shraddha-nurse with [facility] called left voicemail message stating patient is refusing visits for nursing this week stating no need to have many people coming in and out of his home. [employer] said patient is afraid because of the conronavirus. She said patient stated he saw Dr [name] yesterday. [employer] said she tried to explain about the visits. [employer] said patient may want to change frequency as well and need to have a care plan in place. [employer] said patient's wife said Dr [name] told them that there is no need for the nurse to come to their home. The number to contact [employer] is [phone]

## 2018-06-06 ENCOUNTER — Other Ambulatory Visit: Payer: Self-pay | Admitting: Pharmacist

## 2018-06-06 NOTE — Patient Outreach (Signed)
[facility]) Care Management  06/05/2018  AMIT LEECE 07/04/1933 [id number]  Late documentation-->call took place on 06/05/2018 Successful outreach to [name] (patient's wife). She states that she is putting most recent med list in mailbox.  She is grateful of continued [facility] CM/Pharmacy efforts.  No medication changes were made at ortho appointment.  She is hoping neuro will switch his Depakote to another medication.  She states Lasix continues at 20mg  and they continue to have issues weighing patient.  She states patient does not agree with medication list from facility and is refusing to go by it.  Wife does not want PT/HH coming into home at this time due to virus.  PLAN: -Will complete med rec when documents obtained.   Regina Eck, PharmD, [place]  [phone]

## 2018-06-06 NOTE — Telephone Encounter (Signed)
Called to advise ok to d/c HHN pt was evaluated in the office this week and was advised to use his stump shrinker and limb protector. They are both very nervous about possible exposure. Advised this is ok to d/c from services.

## 2018-06-12 ENCOUNTER — Other Ambulatory Visit: Payer: Self-pay

## 2018-06-12 NOTE — Patient Outreach (Signed)
[facility]) Care Management  06/12/2018  [name] 1933/09/24 [id number]   Received Referral from [facility] on 03-20-2018 for [facility]; however, member discharged to [facility] for short term rehab. Member discharged from SNF on 05-28-2018 to home. RN CM will complete TOC.  Member with 3 hospitalizations in last 6 months.Member with recent hospitalization from 03-15-2018 to 1-9-202 at [facility] for CHF and AKI.Member readmitted to hospital on 03-24-2018 to 04-02-2018 due to R BKA the discharged to SNF. Member readmitted again to hospital due to R BKA infection from 05-09-2018 to 05-12-2018 and then completed rehab at [facility].   PMH: HTN/; CHF; AFIB with RVR; CAD s/p angioplasty; CAP; Acute Hypoxemic Respiratory failure; GERD; Seizure d/o; RBKA; CKD-5; Anemia; Memory deficit; GERD; Insomnia;Hypothyroidism  Called member at [employer] preferred number and member and to spouse [name] on speaker phone. HIPPA identity verified.   Fall: Member and spouse denies fall in past week.  [place]: [name] stated she has limited [facility] PT visits as she does not want many visitors coming to the home at this time. PT will resume in 2 weeks per [name]. RN limited to 2 times.  [name] stated success with rolling member down ramp for appointment last week and stated that a church friend in the neighborhood is available to assist with needs.  Appointments: Family states that member visited the Surgeon and sutures were removed and states that surgical wound, per MD looks good as stated by member and spouse. [name] stated she is in process of scheduling Podiatrist appointment for member's toe nail and stated she applied triple antibiotic ointment to second toe toenail.  [facility] Pharmacist assisting with medication needs. Family denies medication changes currently with exception of switching from potassium tablets to capsules. Member  states he is able to swallow capsules better. [name] stated that most of member's medication are delivered to the home and she drives to drive through window for medications that are not available for home delivery.    Diet: Discussed member's low sodium diet with family. [name] stated that she uses low sodium can beans, no deli, no pizza, no take outs. Prepares fresh market chicken salad, tilapia, and little to no salt. Last weight 154 lbs.  Home care: [name] stated she was able to wash member hair today without difficulty. She was able to drive to window and disposable briefs were delivered to her trunk. Discussed proper handwashing techniques. [name] states that she provides dressing changes with Silvadene to surgical site using sterile gloves and cotton swabs, and band-aid. [name] denies redness, swelling, drainage, foul odor at site and stated site pink and dry and stated that she measures the calf and denies swelling. Family denies member with s/s of cough, SOB, fever and states temperature today 97... 02 sats 97%.  Educated s/s of stroke. Encouraged member to restated education provided and member was able to do so. Member and spouse both stated knowledge to call 911 if s/s of stroke versus driving self.   Family denies any further needs at this time. Will follow up with family next week.   THN CM Care Plan Problem One     Most Recent Value  Care Plan Problem One  Risk for readmission r/t recent hospitalization  Role Documenting the Problem One  Care Management [place] for Problem One  Active  THN Long Term Goal   Member will not be readmitted within next 31 days  THN  Long Term Goal Start Date  05/29/18  Interventions for Problem One Long Term Goal  Assess for any current needs or concerns via family  [facility] CM Short Term Goal #1   member will take medications as prescribed for next 30 days  [facility]N CM Short Term Goal #1 Start Date  05/29/18  Interventions for Short Term  Goal #1  Assessed for compliance of taking medications as prescribed,  Discussed medication delivery to the home  [facility] CM Short Term Goal #2   Member will remain free from s/s of infection at R BKA within next 30 days  THN CM Short Term Goal #2 Start Date  05/29/18  Interventions for Short Term Goal #2  Assessed for any redness, swelling, drainage, foul odor at surgical site  [facility] CM Short Term Goal #3  Member will be free from s/s of coronavirus  THN CM Short Term Goal #3 Start Date  06/12/18  Interventions for Short Tern Goal #3  Discussed proper handwashing,  discussed social distancing    [facility] CM Care Plan Problem Two     Most Recent Value  Care Plan Problem Two  Knowledge Deficit related to CHF as evidenced by referral received for CHF   Role Documenting the Problem Two  Care Management Coordinator  Care Plan for Problem Two  Active  Interventions for Problem Two Long Term Goal   Assessed for c/o dyspnea or swelling  THN Long Term Goal  Member wil not experience exacerbation of CHF for next 31 days  THN Long Term Goal Start Date  05/29/18  [facility] CM Short Term Goal #1   family will discuss plans for weighing member daily within next 2 weeks  THN CM Short Term Goal #1 Start Date  05/29/18  Interventions for Short Term Goal #2   Assessed current weight status,  assessed compliance of low sodium diet      [name], RN-BSN  [place] Management  Community Care Management Coordinator  [phone] [email or web address]

## 2018-06-13 ENCOUNTER — Other Ambulatory Visit: Payer: Self-pay | Admitting: Pharmacist

## 2018-06-13 NOTE — Progress Notes (Signed)
Thanks for reaching out.  - ASA should be 81 mg daily.  - Doxazosin should be 2 mg at bedtime daily.  - The dose of furosemide was being adjusted 20 mg versus 40 mg for the degree of fluid retention (based on weight if he was able to stand, based on leg girth that his wife was measuring daily if unable to weigh).  - Metoprolol 25 mg BID (would rewrite hold parameters for SBP<90 or DBP<50)  - Can leave the hold parameters for diltiazem and doxazosin unchanged, as they were in NH  - Will send in the new Rx for KCl and NTG as requested.  Please give Mr. And Mrs. [name] my best wishes.  MCr

## 2018-06-13 NOTE — Patient Outreach (Signed)
[facility]) Care Management  06/13/2018  [name] Apr 29, 1933 [id number]  Successful communications sent to PCP and Cardiologist to clarify medications s/p SNF.  Patient is now home and would like for his providers to comment on therapy.    PLAN: -I will f/u in 3-4 business days as medications are clarified.  Regina Eck, PharmD, [place]  [phone]

## 2018-06-16 ENCOUNTER — Telehealth (INDEPENDENT_AMBULATORY_CARE_PROVIDER_SITE_OTHER): Payer: Self-pay

## 2018-06-16 NOTE — Telephone Encounter (Signed)
Pt wife was advised to keep appt and she understood. She also stated that she does pt dressing changes so she will need to go back with him when they come in. Pt stated she is concerned about COVID-19 and asked about our precautions here.

## 2018-06-16 NOTE — Telephone Encounter (Signed)
Patient's wife [name] would like a call back to see if she needs to keep appointment for patient on Thursday, 06/19/2018.  Cb# is [phone].  Stated that she had some questions.  Please advise.  Thank you.

## 2018-06-17 ENCOUNTER — Encounter: Payer: Self-pay | Admitting: Cardiovascular Disease

## 2018-06-17 NOTE — Progress Notes (Signed)
Called and spoke to Mr. & [name].  He has been sleepy and rather weak and his blood pressure runs relatively low since he returned from the nursing home.  After his right below the knee amputation his weight is obviously lower around 175 pounds, difficult to compare to his previous weight.  We were using his left calf circumference is an equivalent for his "dry weight" since he could not stand on a scale.  We were shooting for circumference of 14 inches he currently has a calf circumference of 13 inches, but has been losing weight and is deconditioned.  [name] has to hold his Cardizem about a quarter of the time because his systolic blood pressure is too low.  He is taking doxazosin rather than silodosin due to cost issues.  His heart rate is consistently well controlled in the 70s.  He has permanent atrial fibrillation.  We will try to switch from doxazosin to tamsulosin which hopefully will be affordable but not lower the blood pressure is much.  Reduce diltiazem to 240 mg daily.  Scheduled for an office telemedicine visit in a week.  Sanda Klein, MD, [facility] [phone] office [phone] pager

## 2018-06-18 ENCOUNTER — Ambulatory Visit: Payer: Self-pay | Admitting: Pharmacist

## 2018-06-18 ENCOUNTER — Telehealth (INDEPENDENT_AMBULATORY_CARE_PROVIDER_SITE_OTHER): Payer: Self-pay

## 2018-06-18 ENCOUNTER — Other Ambulatory Visit: Payer: Self-pay | Admitting: Pharmacist

## 2018-06-18 NOTE — Telephone Encounter (Signed)
I called and sw pt and he answered all COVID-19 prescreen questions NO. Pt has an appt tomorrow at 1:45pm.

## 2018-06-18 NOTE — Patient Outreach (Signed)
[name]) Care Management  [place]   06/18/2018  LINO WICKLIFF [birth year] [id number]  Reason for referral: Medication Review [name]  Referral source: [facility] Inpatient Liaison Current insurance: [place]  PMHx includes but not limited to:  Afib, recent AKA, BPH, hypothyroidsim  Outreach:  Successful telephone call with Ms. Stumpe.  HIPAA identifiers verified.   Medications updated to reflect the below: Per recent Cards notes:" Mrs. Buffalo has to hold his Cardizem about a quarter of the time because his systolic blood pressure is too low.  He is taking doxazosin rather than silodosin due to cost issues. His heart rate is consistently well controlled in the 70s.  He has permanent atrial fibrillation.  We will try to switch from doxazosin to tamsulosin which hopefully will be affordable but not lower the blood pressure is much.  Reduce diltiazem to 240 mg daily.  Scheduled for an office telemedicine visit in a week." Per Dr. Renaee Munda: - Aspirin should be 81 mg daily.  - Doxazosin should be 2 mg at bedtime daily (now switching to tamsulosin per recent communication above) - Furosemide was being adjusted 20 mg versus 40 mg for the degree of fluid retention (based on weight if he was able to stand, based on leg girth that his wife was measuring daily if unable to weigh).  - Metoprolol 25 mg BID (would rewrite hold parameters for SBP<90 or DBP<50)  - Can leave the hold parameters for diltiazem (hold for SBP<110 & DBP <60), as they were in [place] -->decreasing Dilt to 240mg  - New Rx for KCl and NTG as requested.    Objective: Lab Results  Component Value Date   CREATININE 1.56 (H) 05/12/2018   CREATININE 1.52 (H) 05/11/2018   CREATININE 1.27 (H) 05/10/2018    Lab Results  Component Value Date   HGBA1C 5.7 (H) 03/26/2018    Lipid Panel     Component Value Date/Time   CHOL 88 03/26/2018 0445   TRIG 75 03/26/2018 0445   HDL 38 (L) 03/26/2018 0445   CHOLHDL 2.3  03/26/2018 0445   VLDL 15 03/26/2018 0445   LDLCALC 35 03/26/2018 0445    BP Readings from Last 3 Encounters:  05/12/18 121/64  05/07/18 112/61  04/23/18 110/72    Allergies  Allergen Reactions  . Eliquis [Apixaban] Other (See Comments)    bleeding  . Demerol [Meperidine] Nausea And Vomiting  . Keppra [Levetiracetam] Other (See Comments)    Causes sleepiness, mental status changes    Medications Reviewed Today    Reviewed by Newt Minion, MD (Physician) on 06/05/18 at 1603  Med List Status: <None>  Medication Order Taking? Sig Documenting Provider Last Dose Status Informant  acetaminophen (TYLENOL) 325 MG tablet [id number] Yes Take 2 tablets (650 mg total) by mouth every 6 (six) hours as needed for mild pain (pain score 1-3 or temp > 100.5). Debbe Odea, MD Taking Active Nursing Home Medication Administration Guide (MAG)           Med Note Orvan Seen, Sharlette Dense   Fri May 09, 2018  5:01 PM) Discontinued 04/21/18 per [facility]  alendronate (FOSAMAX) 70 MG tablet [id number] Yes Take 70 mg by mouth every Wednesday. Remain upright for 30-60 minutes. [provider] Taking Active Nursing Home Medication Administration Guide (MAG)           Med Note Vennie Homans   Fri Nov 08, 2017  3:41 PM)    Amino Acids-Protein Hydrolys (FEEDING SUPPLEMENT, PRO-STAT SUGAR  FREE 64,) LIQD [id number] Yes Take 30 mLs by mouth 2 (two) times daily. Lavina Hamman, MD Taking Active Nursing Home Medication Administration Guide (MAG)  aspirin EC 325 MG EC tablet [id number] Yes Take 1 tablet (325 mg total) by mouth daily. Debbe Odea, MD Taking Active Nursing Home Medication Administration Guide (MAG)  bisacodyl (DULCOLAX) 10 MG suppository [id number] Yes Place 1 suppository (10 mg total) rectally daily as needed for moderate constipation. Debbe Odea, MD Taking Active Nursing Home Medication Administration Guide (MAG)  calcium-vitamin D (OSCAL WITH D) 500-200 MG-UNIT tablet [id number] Yes Take 1 tablet by  mouth daily after breakfast.  [provider] Taking Active Nursing Home Medication Administration Guide (MAG)  cholecalciferol (VITAMIN D) 25 MCG (1000 UT) tablet [id number] Yes Take 1,000 Units by mouth daily. [provider] Taking Active Nursing Home Medication Administration Guide (MAG)  cyanocobalamin 500 MCG tablet [id number] Yes Take 500 mcg by mouth at bedtime. Vitamin B12 [provider] Taking Active Nursing Home Medication Administration Guide (MAG)  diclofenac sodium (VOLTAREN) 1 % GEL [id number] Yes Apply 2 g topically daily as needed (pain). [provider] Taking Active Nursing Home Medication Administration Guide (MAG)  diltiazem (CARDIZEM CD) 300 MG 24 hr capsule [id number] Yes TAKE 1 CAPSULE(300 MG) BY MOUTH EVERY MORNING. DO NOT CRUSH  Patient taking differently:  Take 300 mg by mouth daily. For HTN -  hold med for SBP less than 110 and DBP less than 60   Croitoru, Mihai, MD Taking Active Nursing Home Medication Administration Guide (MAG)  divalproex (DEPAKOTE) 250 MG DR tablet [id number] Yes Take 250-500 mg by mouth See admin instructions. Take one tablet (250 mg) by mouth ever morning and two tablets (500 mg) every evening (for epilepsy) [provider] Taking Active Nursing Home Medication Administration Guide (MAG)  docusate sodium (COLACE) 100 MG capsule [id number] Yes Take 1 capsule (100 mg total) by mouth 2 (two) times daily. Debbe Odea, MD Taking Active Nursing Home Medication Administration Guide (MAG)  doxazosin (CARDURA) 4 MG tablet [id number] Yes Take 2 mg by mouth every evening. Hold med for SBP less than 110  And DBP less than 60 [provider] Taking Active Nursing Home Medication Administration Guide (MAG)  ezetimibe (ZETIA) 10 MG tablet [id number] Yes Take 5 mg by mouth at bedtime. [provider] Taking Active Nursing Home Medication Administration Guide (MAG)  feeding supplement, ENSURE ENLIVE, (ENSURE  ENLIVE) LIQD [id number] Yes Take 237 mLs by mouth 2 (two) times daily between meals. Aline August, MD Taking Active   ferrous sulfate 325 (65 FE) MG tablet [id number] Yes Take 1 tablet (325 mg total) by mouth 2 (two) times daily with a meal. Skeet Latch, MD Taking Active Nursing Home Medication Administration Guide (MAG)  fluticasone (FLONASE) 50 MCG/ACT nasal spray [id number] Yes Place 2 sprays into both nostrils daily as needed for allergies or rhinitis. [provider] Taking Active Nursing Home Medication Administration Guide (MAG)           Med Note Orvan Seen, HEATHER L   Fri May 09, 2018  4:31 PM) OTC  furosemide (LASIX) 20 MG tablet [id number] Yes Take 20 mg by mouth daily. [provider] Taking Active Nursing Home Medication Administration Guide (MAG)  HYDROcodone-acetaminophen (NORCO) 10-325 MG tablet [id number] Yes Take 1 tablet by mouth every 8 (eight) hours as needed for moderate pain. Aline August, MD Taking Active   ipratropium-albuterol (DUONEB) 0.5-2.5 (3) MG/3ML SOLN [id number] Yes Take 3 mLs by nebulization every 4 (four) hours  as needed (wheezing/ shortness of breath).  [provider] Taking Active Nursing Home Medication Administration Guide (MAG)  lactobacillus acidophilus & bulgar (LACTINEX) chewable tablet [id number] Yes Chew 1 tablet by mouth 3 (three) times daily with meals. Roxan Hockey, MD Taking Active Other           Med Note Orvan Seen, Sharlette Dense   Fri May 09, 2018  4:59 PM) See entry for probiotic capsules  levothyroxine (SYNTHROID, LEVOTHROID) 150 MCG tablet [id number] Yes Take 150 mcg by mouth daily before breakfast. [provider] Taking Active Nursing Home Medication Administration Guide (MAG)  Lidocaine (ASPERCREME LIDOCAINE) 4 % PTCH [id number] Yes Apply 1 patch topically 2 (two) times daily.  [provider] Taking Active Nursing Home Medication Administration Guide (MAG)  magnesium citrate SOLN [id number] Yes Take 296  mLs (1 Bottle total) by mouth once as needed for severe constipation. Debbe Odea, MD Taking Active Nursing Home Medication Administration Guide (MAG)  Menthol-Methyl Salicylate (MUSCLE RUB) 10-15 % CREA [id number] Yes Apply 1 application topically daily as needed (shoulder pain). [provider] Taking Active Nursing Home Medication Administration Guide (MAG)  methocarbamol (ROBAXIN) 500 MG tablet [id number] Yes Take 1 tablet (500 mg total) by mouth every 6 (six) hours as needed for muscle spasms. Debbe Odea, MD Taking Active Nursing Home Medication Administration Guide (MAG)  metoprolol tartrate (LOPRESSOR) 25 MG tablet [id number] Yes Take 1 tablet (25 mg total) by mouth 2 (two) times daily. Take with or immediately following a meal.  Patient taking differently:  Take 25 mg by mouth 2 (two) times daily with a meal. Hold med for SBP less than 110 and DBP less than 60   Croitoru, Mihai, MD Taking Active Nursing Home Medication Administration Guide (MAG)           Med Note Orvan Seen, HEATHER L   Fri May 09, 2018  4:35 PM)    Multiple Vitamins-Minerals (CERTAVITE/ANTIOXIDANTS) TABS [id number] Yes Take 1 tablet by mouth every evening. With Iron 18 mg/ folic acid 542 mcg [provider] Taking Active Nursing Home Medication Administration Guide (MAG)  nitroGLYCERIN (NITROSTAT) 0.4 MG SL tablet [id number] Yes Place 0.4 mg under the tongue every 5 (five) minutes as needed for chest pain (call 911 afer 3 doses and if chest pain persists).  [provider] Taking Active Nursing Home Medication Administration Guide (MAG)           Med Note Orvan Seen, Sharlette Dense   Fri May 09, 2018  4:38 PM)    omeprazole (PRILOSEC) 20 MG capsule [id number] Yes Take 20 mg by mouth daily before breakfast.  [provider] Taking Active Nursing Home Medication Administration Guide (MAG)           Med Note Nyoka Cowden, FELICIA D   Sat Sep 14, 2017  1:21 PM)    polyethylene glycol (MIRALAX / GLYCOLAX) packet  [id number] Yes Take 17 g by mouth daily as needed for mild constipation. Debbe Odea, MD Taking Active Nursing Home Medication Administration Guide (MAG)  potassium chloride (MICRO-K) 10 MEQ CR capsule [id number] Yes Take 20 mEq by mouth daily. [provider] Taking Active Nursing Home Medication Administration Guide (MAG)  pravastatin (PRAVACHOL) 40 MG tablet [id number] Yes Take 40 mg by mouth every evening.  [provider] Taking Active Nursing Home Medication Administration Guide (MAG)           Med Note Tamala Julian, JEFFREY W   Wed Jul 06, 2015  6:11 AM)    predniSONE (DELTASONE) 5  MG tablet [id number] Yes Take 2 tablets (10 mg total) by mouth daily with breakfast.  Patient taking differently:  Take 5 mg by mouth daily with breakfast.    Debbe Odea, MD Taking Active Nursing Home Medication Administration Guide (MAG)           Med Note Orvan Seen, HEATHER L   Fri May 09, 2018  4:46 PM) Continuous course - no stop date  Probiotic CAPS [id number] Yes Take 1 capsule by mouth 3 (three) times daily with meals. Lacto.acidophilus-bif.animalis - capsule, sprinkle; 5 billion cell [provider] Taking Active Nursing Home Medication Administration Guide (MAG)  silver sulfADIAZINE (SILVADENE) 1 % cream [id number] Yes Apply 1 application topically daily. Newt Minion, MD Taking Active   sodium chloride (OCEAN) 0.65 % SOLN nasal spray [id number] Yes Place 1 spray into both nostrils daily as needed for congestion.  [provider] Taking Active Nursing Home Medication Administration Guide (MAG)  Med List Note Gwynne Edinger 05/09/18 1621): [facility] and rehab [phone]           Plan: . Medications reconciled [name] . List mailed to patient    Regina Eck, PharmD, [place]  [phone]

## 2018-06-19 ENCOUNTER — Other Ambulatory Visit: Payer: Self-pay

## 2018-06-19 ENCOUNTER — Telehealth: Payer: Self-pay

## 2018-06-19 ENCOUNTER — Ambulatory Visit (INDEPENDENT_AMBULATORY_CARE_PROVIDER_SITE_OTHER): Payer: Medicare Other | Admitting: Orthopedic Surgery

## 2018-06-19 ENCOUNTER — Encounter (INDEPENDENT_AMBULATORY_CARE_PROVIDER_SITE_OTHER): Payer: Self-pay | Admitting: Orthopedic Surgery

## 2018-06-19 ENCOUNTER — Ambulatory Visit: Payer: Medicare Other | Admitting: Neurology

## 2018-06-19 VITALS — Ht 69.0 in | Wt 154.0 lb

## 2018-06-19 DIAGNOSIS — Z89511 Acquired absence of right leg below knee: Secondary | ICD-10-CM

## 2018-06-19 DIAGNOSIS — L97521 Non-pressure chronic ulcer of other part of left foot limited to breakdown of skin: Secondary | ICD-10-CM

## 2018-06-19 DIAGNOSIS — I2510 Atherosclerotic heart disease of native coronary artery without angina pectoris: Secondary | ICD-10-CM

## 2018-06-19 DIAGNOSIS — Z9861 Coronary angioplasty status: Secondary | ICD-10-CM

## 2018-06-19 MED ORDER — DILTIAZEM HCL ER COATED BEADS 240 MG PO CP24: ORAL_CAPSULE | ORAL | 3 refills | Status: DC

## 2018-06-19 MED ORDER — TAMSULOSIN HCL 0.4 MG PO CAPS: 0.4000 mg | ORAL_CAPSULE | Freq: Every day | ORAL | 6 refills | Status: DC

## 2018-06-19 MED ORDER — NITROGLYCERIN 0.4 MG SL SUBL: 0.4000 mg | SUBLINGUAL_TABLET | SUBLINGUAL | 11 refills | Status: DC | PRN

## 2018-06-19 NOTE — Progress Notes (Signed)
Office Visit Note   Patient: [name]           Date of Birth: Nov 25, 1933           MRN: [id number] Visit Date: 06/19/2018              Requested by: [name], [address], [place] [address] PCP: [name], MD  Chief Complaint  Patient presents with  . Right Leg - Routine Post Op    03/28/2018 right BKA       HPI: Patient is an 85 year old gentleman who is seen for 2 separate issues #1 he is 2 and half months status post right transtibial amputation he has 2 open wounds with a small amount of fibrinous tissue.  Patient also states he had an avulsion of the toenail to the third toe left foot.  He states the skilled nursing facility would not let him see a podiatrist and he asked if I could evaluate the left foot.  Assessment & Plan: Visit Diagnoses:  1. S/P BKA (below knee amputation) unilateral, right ([place])   2. Ulcer of toe, left, limited to breakdown of skin ([place])     Plan: Patient will use a Band-Aid on the left foot third toe recommended the knee-high compression stocking for the venous stasis swelling in the left leg.  Recommended wearing the stump shrinker directly against the skin for the right leg wear this around the clock wash his leg with soap and water.  Follow-Up Instructions: Return in about 2 weeks (around 07/03/2018).   Ortho Exam  Patient is alert, oriented, no adenopathy, well-dressed, normal affect, normal respiratory effort. Examination patient's right transtibial amputation has some venous stasis swelling there are 2 ulcers one directly anteriorly over the excision which is about 5 mm in diameter 1 mm deep with fibrinous tissue.  Laterally there is a separate wound that is about 10 mm in diameter 3 mm deep ulcer with fibrous tissue these are both macerated from using Silvadene cream.  There is no cellulitis no purulence.  Examination the left foot patient's shoe was removed while examining the patient shoe his cell phone came  out of the shoe.  Patient is wife were both surprised because they have not been able to find his cell phone for a while.  Examination he has avulsed the third toenail left foot but there is no cellulitis no ischemic changes no signs of infection.  Imaging: No results found. No images are attached to the encounter.  Labs: Lab Results  Component Value Date   HGBA1C 5.7 (H) 03/26/2018   HGBA1C 5.0 07/12/2017   HGBA1C 5.8 (H) 07/06/2015   ESRSEDRATE 24 (H) 03/24/2018   ESRSEDRATE 15 04/18/2017   ESRSEDRATE 5 09/17/2007   CRP 3.1 (H) 05/11/2018   CRP 1.8 (H) 03/24/2018   CRP <0.8 04/24/2017   LABURIC 6.0 03/09/2016   REPTSTATUS 05/14/2018 FINAL 05/09/2018   GRAMSTAIN  07/15/2017    WBC PRESENT,BOTH PMN AND MONONUCLEAR NO ORGANISMS SEEN Performed at [facility], [place] [address], [place], [place] [address]    CULT  05/09/2018    NO GROWTH 5 DAYS Performed at [facility] [address] [place],  [address]    LABORGA ESCHERICHIA COLI (A) 09/14/2017     Lab Results  Component Value Date   ALBUMIN 3.0 (L) 05/09/2018   ALBUMIN 2.5 (L) 03/31/2018   ALBUMIN 2.7 (L) 03/30/2018   PREALBUMIN 36 (H) 11/22/2017   PREALBUMIN  28.8 04/24/2017   LABURIC 6.0 03/09/2016    Body mass index is 22.74 kg/m.  Orders:  No orders of the defined types were placed in this encounter.  No orders of the defined types were placed in this encounter.    Procedures: No procedures performed  Clinical Data: No additional findings.  ROS:  All other systems negative, except as noted in the HPI. Review of Systems  Objective: Vital Signs: Ht 5\' 9"  (1.753 m)   Wt 154 lb (69.9 kg)   BMI 22.74 kg/m   Specialty Comments:  No specialty comments available.  PMFS History: Patient Active Problem List   Diagnosis Date Noted  . Wound infection 05/09/2018  . AKI (acute kidney injury) ([place])   . S/P BKA (below knee amputation) unilateral, right ([place])   . Unilateral complete  BKA, right, initial encounter ([place])   . Tachypnea   . Post-operative pain   . Subacute osteomyelitis, right ankle and foot ([place])   . Paralysis of right lower extremity ([place])   . TIA (transient ischemic attack) 03/24/2018  . Encephalopathy 03/24/2018  . Cellulitis 03/15/2018  . Hypernatremia 03/15/2018  . Persistent atrial fibrillation 11/10/2017  . Severe muscle deconditioning 11/10/2017  . Hemorrhage 10/22/2017  . CAD S/P percutaneous coronary angioplasty 10/22/2017  . Acute hypoxemic respiratory failure ([place]) 07/17/2017  . Aspiration syndrome, subsequent encounter 07/11/2017  . Aspiration pneumonia ([place]) 07/01/2017  . Acute encephalopathy 07/01/2017  . Thrombocytopenia ([place]) 07/01/2017  . Chronic diastolic (congestive) heart failure ([place]) 07/01/2017  . Anemia of chronic disease 07/01/2017  . Troponin level elevated 07/01/2017  . Chronic atrial fibrillation 07/01/2017  . Goals of care, counseling/discussion   . Palliative care by specialist   . Acute metabolic encephalopathy 26/71/2458  . CKD (chronic kidney disease) stage 3, GFR 30-59 ml/min (HCC)   . CAP (community acquired pneumonia) 04/17/2017  . Hallux rigidus, right foot 03/10/2016  . Lumbosacral spondylosis without myelopathy 10/29/2015  . Memory difficulty 09/22/2015  . Abnormality of gait 09/22/2015  . Hyperglycemia 07/06/2015  . Chronic pain 07/06/2015  . Chronic insomnia 03/30/2015  . Transient alteration of awareness 03/30/2015  . Abnormal liver function   . Altered mental status 01/31/2015  . Essential hypertension 01/31/2015  . Constipation 01/31/2015  . Hypothyroidism 01/31/2015  . Seizure disorder ([place]) 01/31/2015  . Bladder outlet obstruction 01/31/2015  . GERD (gastroesophageal reflux disease) 01/31/2015  . Chronic back pain 01/31/2015  . Acute kidney injury ([place]) 01/31/2015   Past Medical History:  Diagnosis Date  . Abnormality of gait 09/22/2015  . Arthritis   . Atrial fibrillation ([place])   . CAD  (coronary artery disease)    Stent to RCA, Penta stent, 99% reduced to 0% 2002.  . Cancer ([place])    skin CA removed from back  . Chronic insomnia 03/30/2015  . Complication of anesthesia    trouble waking up  . GERD (gastroesophageal reflux disease)   . Hypercholesteremia   . Hypertension   . Hypothyroidism   . Memory difficulty 09/22/2015  . Osteoarthritis   . Seizures ([place])   . Sepsis ([place]) 05/2017  . Transient alteration of awareness 03/30/2015  . Vertigo    hx of    Family History  Problem Relation Age of Onset  . Hypertension Mother   . Cancer Mother   . Kidney failure Father   . Heart disease Father     Past Surgical History:  Procedure Laterality Date  . AMPUTATION Right 03/28/2018   Procedure: AMPUTATION BELOW KNEE;  Surgeon: Newt Minion, MD;  Location: [facility];  Service: Orthopedics;  Laterality: Right;  . BACK SURGERY    . EYE SURGERY     Bilateral Cataract surgery   . HERNIA REPAIR    . I&D EXTREMITY Right 05/10/2015   Procedure: IRRIGATION AND DEBRIDEMENT EXTREMITY;  Surgeon: Leanora Cover, MD;  Location: [facility];  Service: Orthopedics;  Laterality: Right;  . KNEE ARTHROPLASTY     right knee X 2; left knee once  . LAMINECTOMY     X 6  . POSTERIOR CERVICAL FUSION/FORAMINOTOMY  01/28/2012   Procedure: POSTERIOR CERVICAL FUSION/FORAMINOTOMY LEVEL 3;  Surgeon: [name], MD;  Location: [facility];  Service: Neurosurgery;  Laterality: Left;  Posterior Cervical Five-Thoracic One Fusion, Arthrodesis with LEFT Cervical Seven-thoracic One Laminectomy, Foraminotomy and Resection of Synovial Cyst  . POSTERIOR CERVICAL FUSION/FORAMINOTOMY N/A 01/29/2013   Procedure: POSTERIOR CERVICAL FUSION/FORAMINOTOMY LEVEL 1 and C2-5 Posteriolateral Arthrodesis;  Surgeon: [name], MD;  Location: [facility];  Service: Neurosurgery;  Laterality: N/A;  C2-C3 Laminectomy C2-C3 posterior cervical arthrodesis  . TONSILLECTOMY     Social History    Occupational History  . Occupation: retired Software engineer  Tobacco Use  . Smoking status: Former Research scientist (life sciences)  . Smokeless tobacco: Never Used  Substance and Sexual Activity  . Alcohol use: Not Currently    Comment: rare  . Drug use: No  . Sexual activity: Not Currently

## 2018-06-19 NOTE — Patient Outreach (Addendum)
[facility]) Care Management  06/19/2018  [name] 12-Sep-1933 [id number]   Member with 3 hospitalizations in last 6 months.Member with recent hospitalization from 03-15-2018 to 1-9-202 at [facility] for CHF and AKI.Member readmitted to hospital on 03-24-2018 to 04-02-2018 due to R BKA the discharged to SNF. Member readmitted again to hospital due to R BKA infection from 05-09-2018 to 05-12-2018 and then completed rehab at [facility].   PMH: HTN/; CHF; AFIB with RVR; CAD s/p angioplasty; CAP; Acute Hypoxemic Respiratory failure; GERD; Seizure d/o; RBKA; CKD-5; Anemia; Memory deficit; GERD; Insomnia;Hypothyroidism  Called member at [employer] preferred number and member and to spouse [name] on speaker phone. HIPPA identity verified.   Member states he is doing well and stated he visited MD today and voices no concerns.    Appointments: Member states he visited with Orthopedic Surgeon today and member denies any concerns. Per Dr. Jess Barters notation today "Plan: Patient will use a Band-Aid on the left foot third toe recommended the knee-high compression stocking for the venous stasis swelling in the left leg.  Recommended wearing the stump shrinker directly against the skin for the right leg wear this around the clock wash his leg with soap and water.  Follow-Up Instructions: Return in about 2 weeks (around 07/03/2018)."  Medications: Member denies any medication concerns and member's spouse [name] states changes to 2 of member's medications: Lasix and Metoprolol. [facility] Pharmacist assisting member with medications. Member made RN CM aware that "I am a retired Software engineer and am aware of importance of taking medications as ordered."  Pain: Member states pain when getting out of bed; phantom pain, level 6 on scale 0-10; and states that pain medication and elevation of R BKA is effective pain relief with pain goal of 0.   Provided education relating to COVID-19 and Care Plan  updated. Member was able to restated signs and symptoms of COVID, stated compliance with handwashing, states knowledg of when to call provider. Member stated "Me and my wife stay home and we are very careful".   Member states preference not to be called next week and member and spouse [name] states agreement to be called next month.   Will follow up with member in next month.   [facility] CM Care Plan Problem One     Most Recent Value  Care Plan Problem One  Risk for readmission r/t recent hospitalization  Role Documenting the Problem One  Care Management [place] for Problem One  Active  THN Long Term Goal   Member will not be readmitted within next 31 days  THN Long Term Goal Start Date  05/29/18  Interventions for Problem One Long Term Goal  Discussed member's visit with Orthopedic surgeon today  [facility] CM Short Term Goal #1   member will take medications as prescribed for next 30 days  THN CM Short Term Goal #1 Start Date  05/29/18  Interventions for Short Term Goal #1  Assessed for any changes in medication  THN CM Short Term Goal #2   Member will remain free from s/s of infection at R BKA within next 30 days  THN CM Short Term Goal #2 Start Date  05/29/18  Interventions for Short Term Goal #2  Assess for member's knowledge of visit with Orthopedic Surgeion  THN CM Short Term Goal #3  Member will be free from s/s of coronavirus  THN CM Short Term Goal #3 Start Date  06/12/18  [facility] CM Short Term Goal #3  Met Date  06/19/18    [facility] CM Care Plan Problem Two     Most Recent Value  Care Plan Problem Two  Knowledge Deficit related to CHF as evidenced by referral received for CHF   Role Documenting the Problem Two  Care Management Coordinator  Care Plan for Problem Two  Active  Interventions for Problem Two Long Term Goal   Reinforced education related to signs and symptoms of CHF.  [place] Term Goal  Member wil not experience exacerbation of CHF for next 31 days  THN Long Term Goal  Start Date  05/29/18  [facility] CM Short Term Goal #1   family will discuss plans for weighing member daily within next 2 weeks  THN CM Short Term Goal #1 Start Date  05/29/18  [facility] CM Short Term Goal #1 Met Date   06/19/18  Interventions for Short Term Goal #2   Provided listening while member's spouse states inability to weigh member at home and states member will be weighed at MD office.    THN CM Care Plan Problem Three     Most Recent Value  Care Plan Problem Three  Knowledge Deficit related to COVID-19 and impact on patient self-Health management  Role Documenting the Problem Three  Care Management [place] for Problem Three  Active  THN Long Term Goal   Member will not be admitted to hospital for next 31 days related to COVID-19  [facility] Long Term Goal Start Date  06/19/18  [facility] CM Short Term Goal #1   "Over the next 31 days, patient will verbalize basic understanding of COVID-19 impact on individual health and self-health management as evidenced by verbalization of basic understanding of COVID-19 as a viral disease, measures to prevent exposure, signs and symptoms, when to contact provider  [facility] CM Short Term Goal #1 Start Date  06/19/18  Interventions for Short Term Goal #1  Provided education related to COVID-19 signs and symptoms, preventions such as handwashing and social distancing,  Encouraged member to restate education provided,  Assessed member's knowledge of when to call provider     [name], RN-BSN  [place] Management  Community Care Management Coordinator  [phone] [email or web address]

## 2018-06-19 NOTE — Telephone Encounter (Signed)
Spoke to patient's wife.Dr.Croitoru advised  #1 Stop doxazosin #2 Start tamsulosin 0.4 mg daily at bedtime #3 Decrease diltiazem 240 mg every morning  Hold if SBP less than 100 #4 Schedule webex office visit next Monday  Advised scheduler will call back tomorrow to schedule Advised to call back if needed.

## 2018-06-26 ENCOUNTER — Telehealth (INDEPENDENT_AMBULATORY_CARE_PROVIDER_SITE_OTHER): Payer: Medicare Other | Admitting: Cardiovascular Disease

## 2018-06-26 DIAGNOSIS — G40909 Epilepsy, unspecified, not intractable, without status epilepticus: Secondary | ICD-10-CM

## 2018-06-26 DIAGNOSIS — N183 Chronic kidney disease, stage 3 unspecified: Secondary | ICD-10-CM

## 2018-06-26 DIAGNOSIS — I5032 Chronic diastolic (congestive) heart failure: Secondary | ICD-10-CM

## 2018-06-26 DIAGNOSIS — I482 Chronic atrial fibrillation, unspecified: Secondary | ICD-10-CM

## 2018-06-26 DIAGNOSIS — R29898 Other symptoms and signs involving the musculoskeletal system: Secondary | ICD-10-CM

## 2018-06-26 DIAGNOSIS — Z89511 Acquired absence of right leg below knee: Secondary | ICD-10-CM

## 2018-06-26 DIAGNOSIS — I2510 Atherosclerotic heart disease of native coronary artery without angina pectoris: Secondary | ICD-10-CM | POA: Diagnosis not present

## 2018-06-26 DIAGNOSIS — Z9861 Coronary angioplasty status: Secondary | ICD-10-CM

## 2018-06-26 DIAGNOSIS — I10 Essential (primary) hypertension: Secondary | ICD-10-CM

## 2018-06-26 NOTE — Progress Notes (Signed)
Virtual Visit via Telephone Note   This visit type was conducted due to national recommendations for restrictions regarding the COVID-19 Pandemic (e.g. social distancing) in an effort to limit this patient's exposure and mitigate transmission in our community.  Due to his co-morbid illnesses, this patient is at least at moderate risk for complications without adequate follow up.  This format is felt to be most appropriate for this patient at this time.  The patient did not have access to video technology/had technical difficulties with video requiring transitioning to audio format only (telephone).  All issues noted in this document were discussed and addressed.  No physical exam could be performed with this format.  Please refer to the patient's chart for his  consent to telehealth for [facility].   Evaluation Performed:  Follow-up visit  Date:  06/26/2018   ID:  Cecille Aver, DOB 12/21/1933, MRN [id number]  Patient Location: Home Provider Location: Other:  [facility]  PCP:  [name], MD  Cardiologist:  Sanda Klein, MD  Electrophysiologist:  None   Chief Complaint:  CHF follow up  History of Present Illness:    ROWE WARMAN is a 85 y.o. male with  a hx of coronary artery disease (PCI-RCA 1287), diastolic heart failure, difficult to manage due to severe orthostatic hypotension and frequent episodes of acute on chronic renal insufficiency (baseline CKD 3), chronic atrial fibrillation, slow healing chronic right leg wound leading to BKA on January 17 of this year. His recovery was slow.  He called a week ago with complaints of sleepiness and weakness. His BP was low. His HR was consistently in the 70s. We reduced his diltiazem dose and switched from doxazosin to tamsulosin.  Since those changes his blood pressure has been better, typically in the 130/60-70 range and his heart rate has remained well controlled in the 60s and 70s.  Despite that he is still feeling rather  sleepy during the day.  He is wondering if the Depakote dose could be reduced.  I wonder whether the hydrocodone may have something to do with this as well.  Labs performed very recently at [place] showed a creatinine of 1.65, which is higher than his baseline which is around 1.3 and his BUN was 48 compared to a typical baseline around 30.  His potassium was normal at 4.1.  Hemoglobin was 11.1.  Last December his TSH was in normal range.  Anticoagulation with Eliquis was discontinued after he developed a spontaneous bleed in his left leg and dropped his hemoglobin substantially.  He is now taking only aspirin.  He is unable to stand on a scale for daily weights.  We are using a rather nonconventional method of adjusting his diuretics based on his calf caliber.  Left calf circumference equivalent for "dry weight" is a calf circumference of 14 inches.  Mrs. Gaba always measures in the same location that is well marked by an old surgical scar. His weight is also now a new target due to the amputation, obviously lower than before.  Echo in January 2020 showed LVEF 55-60%.  The patient does not have symptoms concerning for COVID-19 infection (fever, chills, cough, or new shortness of breath).    Past Medical History:  Diagnosis Date  . Abnormality of gait 09/22/2015  . Arthritis   . Atrial fibrillation ([place])   . CAD (coronary artery disease)    Stent to RCA, Penta stent, 99% reduced to 0% 2002.  . Cancer ([place])  skin CA removed from back  . Chronic insomnia 03/30/2015  . Complication of anesthesia    trouble waking up  . GERD (gastroesophageal reflux disease)   . Hypercholesteremia   . Hypertension   . Hypothyroidism   . Memory difficulty 09/22/2015  . Osteoarthritis   . Seizures ([place])   . Sepsis ([place]) 05/2017  . Transient alteration of awareness 03/30/2015  . Vertigo    hx of   Past Surgical History:  Procedure Laterality Date  . AMPUTATION Right 03/28/2018   Procedure:  AMPUTATION BELOW KNEE;  Surgeon: Newt Minion, MD;  Location: [place];  Service: Orthopedics;  Laterality: Right;  . BACK SURGERY    . EYE SURGERY     Bilateral Cataract surgery   . HERNIA REPAIR    . I&D EXTREMITY Right 05/10/2015   Procedure: IRRIGATION AND DEBRIDEMENT EXTREMITY;  Surgeon: Leanora Cover, MD;  Location: [place];  Service: Orthopedics;  Laterality: Right;  . KNEE ARTHROPLASTY     right knee X 2; left knee once  . LAMINECTOMY     X 6  . POSTERIOR CERVICAL FUSION/FORAMINOTOMY  01/28/2012   Procedure: POSTERIOR CERVICAL FUSION/FORAMINOTOMY LEVEL 3;  Surgeon: [name], MD;  Location: [place] [facility];  Service: Neurosurgery;  Laterality: Left;  Posterior Cervical Five-Thoracic One Fusion, Arthrodesis with LEFT Cervical Seven-thoracic One Laminectomy, Foraminotomy and Resection of Synovial Cyst  . POSTERIOR CERVICAL FUSION/FORAMINOTOMY N/A 01/29/2013   Procedure: POSTERIOR CERVICAL FUSION/FORAMINOTOMY LEVEL 1 and C2-5 Posteriolateral Arthrodesis;  Surgeon: [name], MD;  Location: [place] [facility];  Service: Neurosurgery;  Laterality: N/A;  C2-C3 Laminectomy C2-C3 posterior cervical arthrodesis  . TONSILLECTOMY       Current Meds  Medication Sig  . alendronate (FOSAMAX) 70 MG tablet Take 70 mg by mouth every Wednesday. Remain upright for 30-60 minutes.  . Amino Acids-Protein Hydrolys (FEEDING SUPPLEMENT, PRO-STAT SUGAR FREE 64,) LIQD Take 30 mLs by mouth daily. Takes daily in morning  . aspirin EC 81 MG tablet Take 81 mg by mouth daily.  . bisacodyl (DULCOLAX) 10 MG suppository Place 1 suppository (10 mg total) rectally daily as needed for moderate constipation.  . calcium carbonate (OSCAL) 1500 (600 Ca) MG TABS tablet Take 600 mg of elemental calcium by mouth daily after breakfast.  . Cholecalciferol (VITAMIN D) 125 MCG (5000 UT) CAPS Take 1 capsule by mouth daily.  . cyanocobalamin 500 MCG tablet Take 500 mcg by mouth at bedtime. Vitamin B12  .  diclofenac sodium (VOLTAREN) 1 % GEL Apply 2 g topically daily as needed (pain).  Marland Kitchen diltiazem (CARDIZEM CD) 240 MG 24 hr capsule Take 240 mg every morning   Hold if systolic blood pressure is less than 100.  . divalproex (DEPAKOTE) 250 MG DR tablet Take 250-500 mg by mouth See admin instructions. Take one tablet (250 mg) by mouth ever morning and two tablets (500 mg) every evening (for epilepsy)  . ezetimibe (ZETIA) 10 MG tablet Take 5 mg by mouth at bedtime.  . fluticasone (FLONASE) 50 MCG/ACT nasal spray Place 2 sprays into both nostrils daily as needed for allergies or rhinitis.  . furosemide (LASIX) 40 MG tablet Take 40 mg by mouth daily.  Marland Kitchen HYDROcodone-acetaminophen (NORCO) 10-325 MG tablet Take 1 tablet by mouth every 4 (four) hours as needed for moderate pain.  Marland Kitchen levothyroxine (SYNTHROID, LEVOTHROID) 150 MCG tablet Take 150 mcg by mouth daily before breakfast.  . Lidocaine (ASPERCREME LIDOCAINE) 4 % PTCH Apply 1 patch topically 2 (two)  times daily as needed.   . lidocaine (LIDODERM) 5 % Place 1 patch onto the skin daily as needed. Remove & Discard patch within 12 hours or as directed by MD  . metoprolol tartrate (LOPRESSOR) 25 MG tablet Take 1 tablet (25 mg total) by mouth 2 (two) times daily. Take with or immediately following a meal. (Patient taking differently: Take 25 mg by mouth 2 (two) times daily with a meal. Hold med for SBP less than 90 and DBP less than 50 Per Dr. Sallyanne Kuster)  . Multiple Vitamins-Minerals (CERTAVITE/ANTIOXIDANTS) TABS Take 1 tablet by mouth every evening. With Iron 18 mg/ folic acid 845 mcg  . nitroGLYCERIN (NITROSTAT) 0.4 MG SL tablet Place 1 tablet (0.4 mg total) under the tongue every 5 (five) minutes as needed for chest pain (call 911 afer 3 doses and if chest pain persists).  Marland Kitchen omeprazole (PRILOSEC) 20 MG capsule Take 20 mg by mouth daily before breakfast.   . polyethylene glycol (MIRALAX / GLYCOLAX) packet Take 17 g by mouth daily as needed for mild constipation.   . potassium chloride SA (K-DUR,KLOR-CON) 20 MEQ tablet Take 20 mEq by mouth daily.  . pravastatin (PRAVACHOL) 40 MG tablet Take 40 mg by mouth every evening.   . predniSONE (DELTASONE) 5 MG tablet Take 2 tablets (10 mg total) by mouth daily with breakfast.  . tamsulosin (FLOMAX) 0.4 MG CAPS capsule Take 1 capsule (0.4 mg total) by mouth at bedtime.  . [DISCONTINUED] furosemide (LASIX) 20 MG tablet Take 20 mg by mouth daily.     Allergies:   Eliquis [apixaban]; Demerol [meperidine]; and Keppra [levetiracetam]   Social History   Tobacco Use  . Smoking status: Former Research scientist (life sciences)  . Smokeless tobacco: Never Used  Substance Use Topics  . Alcohol use: Not Currently    Comment: rare  . Drug use: No     Family Hx: The patient's family history includes Cancer in his mother; Heart disease in his father; Hypertension in his mother; Kidney failure in his father.  ROS:   Please see the history of present illness.     All other systems reviewed and are negative.   Prior CV studies:   The following studies were reviewed today:  03/25/2018 ECHO  - Left ventricle: The cavity size was normal. Wall thickness was   increased in a pattern of moderate LVH. Systolic function was   normal. The estimated ejection fraction was in the range of 55%   to 60%. Left ventricular diastolic function parameters were   normal. - Aortic valve: There was mild regurgitation. - Mitral valve: Calcified annulus. Mildly thickened leaflets .   There was mild regurgitation. Valve area by pressure half-time:   1.04 cm^2. - Left atrium: The atrium was mildly dilated. - Atrial septum: No defect or patent foramen ovale was identified.   Labs/Other Tests and Data Reviewed:    EKG:  An ECG dated 05/09/2018 was personally reviewed today and demonstrated:  atrial fibrillation, QTc 481 ms  Recent Labs: 07/11/2017: B Natriuretic Peptide 309.9 07/12/2017: TSH 0.452 04/23/2018: NT-Pro BNP 3,612 05/09/2018: ALT 43 05/12/2018:  BUN 36; Creatinine, Ser 1.56; Hemoglobin 8.2; Magnesium 2.0; Platelets 186; Potassium 4.1; Sodium 140   Recent Lipid Panel Lab Results  Component Value Date/Time   CHOL 88 03/26/2018 04:45 AM   TRIG 75 03/26/2018 04:45 AM   HDL 38 (L) 03/26/2018 04:45 AM   CHOLHDL 2.3 03/26/2018 04:45 AM   LDLCALC 35 03/26/2018 04:45 AM    Wt Readings from Last  3 Encounters:  06/19/18 154 lb (69.9 kg)  06/04/18 154 lb (69.9 kg)  06/03/18 154 lb (69.9 kg)     Objective:    Vital Signs:  BP 139/76   Pulse 68   Temp (!) 97.5 F (36.4 C)   Ht 5\' 9"  (1.753 m)   SpO2 98%   BMI 22.74 kg/m    Unable to examine  ASSESSMENT & PLAN:    1. CHF: He is not experiencing orthopnea or lower extremity edema.  His lack of energy might be a sign of relative hypovolemia, and this is supported by his labs.  We will ask him to reduce the furosemide to 20 mg daily and also reduce the potassium to 10 mEq daily. 2. AFib: Remains well rate controlled after reducing the dose of diltiazem.  Unable to receive anticoagulants due to severe bleeding complications. 3. Sleepiness: Other explanations for sleepiness could be the use of narcotic analgesics (which he should minimize during the day) or the effects of Depakote (valproic acid level was checked in January and was 40).  I advised that he should not make any changes to the Depakote without consulting with his neurologist.  He has not had a seizure in 4 years. 4. CKD 3: As mentioned above his renal parameters are slightly worse than his baseline and we will reduce the dose of diuretic. 5. CAD: He denies angina.  Excellent lipid profile on combination pravastatin and ezetimibe. 6. S/P R BKA: Unable to use his previous weight for comparison. 7. Orthostatic hypotension: Unfortunately, following his amputation he is really unable to stand, so not an active issue.  After he is fitted for a prosthesis we may have to readdress this. 8. Severe deconditioning: Muscle weakness  compounded by chronic steroid therapy.  He may require stress doses of steroids if he develops a serious illness   COVID-19 Education: The signs and symptoms of COVID-19 were discussed with the patient and how to seek care for testing (follow up with PCP or arrange E-visit).  The importance of social distancing was discussed today.  Time:   Today, I have spent 24 minutes with the patient with telehealth technology discussing the above problems.     Medication Adjustments/Labs and Tests Ordered: Current medicines are reviewed at length with the patient today.  Concerns regarding medicines are outlined above.   Tests Ordered: No orders of the defined types were placed in this encounter.   Medication Changes: No orders of the defined types were placed in this encounter.   Disposition:  Follow up 4-6 weeks e-visit  Signed, Sanda Klein, MD  06/26/2018 12:23 PM    [place]

## 2018-06-26 NOTE — Patient Instructions (Addendum)
Reduce furosemide to 20 mg (half a tablet daily) Reduce KCl to 10 mEq (half a tablet daily) Restart iron supplement, but take only once daily.(has this at home) Televisit in 4-6 weeks please  Tele visit scheduled 07/24/18 at 2:00 pm.. Please mail AVS - having some trouble with mychart

## 2018-07-01 ENCOUNTER — Other Ambulatory Visit: Payer: Self-pay | Admitting: Cardiovascular Disease

## 2018-07-01 MED ORDER — DOXAZOSIN MESYLATE 4 MG PO TABS: ORAL_TABLET | ORAL | 3 refills | Status: DC

## 2018-07-01 NOTE — Telephone Encounter (Signed)
OK to switch back. Please call in Rx for 90 day supply, 3 RF MCr

## 2018-07-01 NOTE — Telephone Encounter (Signed)
Spoke to patient Dr.Croitoru's advice given.Doxazosin 4 mg sent to pharmacy.

## 2018-07-01 NOTE — Telephone Encounter (Signed)
Pt calling stating that Dr. Sallyanne Kuster changed his medication from doxazosin 4 mg tablet to tamsulosin 0.4 mg capsule and pt stated that Dr. Sallyanne Kuster said pt might have problems with urination. Pt stated that he is having extreme urinating problems and would like for Dr. Sallyanne Kuster to change his medication back to doxazosin 4 mg tablet sent to [facility] on [address]. Pt would like a call back at [phone]. Please address

## 2018-07-01 NOTE — Telephone Encounter (Signed)
That is doxazosin 4 mg at bedtime, #90, RF 3

## 2018-07-03 ENCOUNTER — Other Ambulatory Visit: Payer: Self-pay | Admitting: Pharmacist

## 2018-07-03 ENCOUNTER — Ambulatory Visit: Payer: Self-pay | Admitting: Pharmacist

## 2018-07-03 ENCOUNTER — Ambulatory Visit (INDEPENDENT_AMBULATORY_CARE_PROVIDER_SITE_OTHER): Payer: Medicare Other | Admitting: Orthopedic Surgery

## 2018-07-03 NOTE — Patient Outreach (Signed)
[facility]) Care Management [place]  07/03/2018  [name] March 26, 1933 [id number]  Reason for referral: Medication Review/Reconciliation post SNF discharge  [facility] pharmacy case is being closed due to the following reasons:  Goals have been met.   Medication Review Findings:  . Hold parameters corrected per Dr. Tandy Gaw request . D/c'd probiotics . Doxazosin dose was decreased to '2mg'$  (BP has been low; per notes-tried to switch to Flomax, but patient was having "extreme urination issues" -->documented "intolerance" . Diltiazem ER dose decreased to '240mg'$  daily . Furosemide dose decreased to '20mg'$  daily; Potassium decreased to 56mq . Medication list below updated to reflect all of the above changes.    Medications Reviewed Today    Review[facility] on 07/03/18 at 1032  Med List Status: <None>  Medication Order Taking? Sig Documenting Provider Last Dose Status Informant  alendronate (FOSAMAX) 70 MG tablet [id number] Take 70 mg by mouth every Wednesday. Remain upright for 30-60 minutes. [provider] Taking Active Nursing Home Medication Administration Guide (MAG)           Med Note (Vennie Homans  Fri Nov 08, 2017  3:41 PM)    Amino Acids-Protein Hydrolys (FEEDING SUPPLEMENT, PRO-STAT SUGAR FREE 64,) LIQD [id number] Take 30 mLs by mouth daily. Takes daily in morning [provider] Taking Active   aspirin EC 81 MG tablet [id number] Take 81 mg by mouth daily. [provider] Taking Active   bisacodyl (DULCOLAX) 10 MG suppository [id number] Place 1 suppository (10 mg total) rectally daily as needed for moderate constipation. RDebbe Odea MD Taking Active Nursing Home Medication Administration Guide (MAG)  calcium carbonate (OSCAL) 1500 (600 Ca) MG TABS tablet [id number] Take 600 mg of elemental calcium by mouth daily after breakfast. [provider] Taking Active    Cholecalciferol (VITAMIN D) 125 MCG (5000 UT) CAPS [id number] Take 1 capsule by mouth daily. [provider] Taking Active   cyanocobalamin 500 MCG tablet [id number] Take 500 mcg by mouth at bedtime. Vitamin B12 [provider] Taking Active Nursing Home Medication Administration Guide (MAG)  diclofenac sodium (VOLTAREN) 1 % GEL [id number] Apply 2 g topically daily as needed (pain). [provider] Taking Active Nursing Home Medication Administration Guide (MAG)  diltiazem (CARDIZEM CD) 240 MG 24 hr capsule [id number] Take 240 mg every morning   Hold if systolic blood pressure is less than 100. Croitoru, Mihai, MD Taking Active   divalproex (DEPAKOTE) 250 MG DR tablet [id number] Take 250-500 mg by mouth See admin instructions. Take one tablet (250 mg) by mouth ever morning and two tablets (500 mg) every evening (for epilepsy) [provider] Taking Active Nursing Home Medication Administration Guide (MAG)           Med Note (Kristena Wilhelmi D   Thu Jul 03, 2018 10:17 AM) Medication managed per Neuro; pt experiencing drowsiness  doxazosin (CARDURA) 4 MG tablet [id number] Take 2 mg by mouth at bedtime. [provider] Taking Active   ezetimibe (ZETIA) 10 MG tablet [id number] Take 5 mg by mouth at bedtime. [provider] Taking Active Nursing Home Medication Administration Guide (MAG)  ferrous sulfate 325 (65 FE) MG tablet [id number] Take 1 tablet (325 mg total) by mouth daily with breakfast. Croitoru, Mihai, MD Taking Active   fluticasone (FLONASE) 50 MCG/ACT nasal spray [id number] Place 2 sprays into both nostrils daily as needed  for allergies or rhinitis. [provider] Taking Active Nursing Home Medication Administration Guide (MAG)           Med Note Orvan Seen, HEATHER L   Fri May 09, 2018  4:31 PM) OTC  furosemide (LASIX) 40 MG tablet [id number] Yes Take 20 mg by mouth daily. Take 1/2 tablet ( 20 mg ) daily  Croitoru, Mihai, MD Taking Active   HYDROcodone-acetaminophen (NORCO) 10-325 MG tablet [id number] Yes Take 1 tablet by mouth every 4 (four) hours as needed for moderate pain. [provider] Taking Active            Med Note Blanca Friend, Dellas Guard D   Thu Jul 03, 2018 10:18 AM) PRN  levothyroxine (SYNTHROID, LEVOTHROID) 150 MCG tablet [id number] Yes Take 150 mcg by mouth daily before breakfast. [provider] Taking Active Nursing Home Medication Administration Guide (MAG)  Lidocaine (ASPERCREME LIDOCAINE) 4 % PTCH [id number] Yes Apply 1 patch topically 2 (two) times daily as needed.  [provider] Taking Active Nursing Home Medication Administration Guide (MAG)           Med Note (Roston Grunewald D   Thu Jul 03, 2018 10:17 AM) PRN   metoprolol tartrate (LOPRESSOR) 25 MG tablet [id number] Yes Take 1 tablet (25 mg total) by mouth 2 (two) times daily. Take with or immediately following a meal.  Patient taking differently:  Take 25 mg by mouth 2 (two) times daily with a meal. Hold med for SBP less than 90 and DBP less than 50 Per Dr. Livingston Diones, Dani Gobble, MD Taking Active Nursing Home Medication Administration Guide (MAG)  Multiple Vitamins-Minerals (CERTAVITE/ANTIOXIDANTS) TABS [id number] Yes Take 1 tablet by mouth every evening. With Iron 18 mg/ folic acid 924 mcg [provider] Taking Active Nursing Home Medication Administration Guide (MAG)  nitroGLYCERIN (NITROSTAT) 0.4 MG SL tablet [id number] Yes Place 1 tablet (0.4 mg total) under the tongue every 5 (five) minutes as needed for chest pain (call 911 afer 3 doses and if chest pain persists). Croitoru, Mihai, MD Taking Active   omeprazole (PRILOSEC) 20 MG capsule [id number] Yes Take 20 mg by mouth daily before breakfast.  [provider] Taking Active Nursing Home Medication Administration Guide (MAG)  polyethylene glycol (MIRALAX / GLYCOLAX) packet [id number] Yes Take 17 g by mouth daily as needed for mild  constipation. Debbe Odea, MD Taking Active Nursing Home Medication Administration Guide (MAG)  potassium chloride SA (K-DUR) 20 MEQ tablet [id number] Yes Take 1/2 tablet ( 10 mEq ) by mouth daily Croitoru, Mihai, MD Taking Active   pravastatin (PRAVACHOL) 40 MG tablet [id number] Yes Take 40 mg by mouth every evening.  [provider] Taking Active Nursing Home Medication Administration Guide (MAG)  predniSONE (DELTASONE) 5 MG tablet [id number] Yes Take 2 tablets (10 mg total) by mouth daily with breakfast. Debbe Odea, MD Taking Active Nursing Home Medication Administration Guide (MAG)       Patient has been provided [facility] CM contact information if assistance needed in the future.    Thank you for allowing [facility] pharmacy to be involved in this patient's care.      PLAN: . Will mail patient and wife most updated medication list . Will close this case as [place] goals have been met.  I'm happy to assist in the future as needed  Regina Eck, PharmD, [place]  [phone]

## 2018-07-10 ENCOUNTER — Other Ambulatory Visit: Payer: Self-pay

## 2018-07-10 ENCOUNTER — Ambulatory Visit (INDEPENDENT_AMBULATORY_CARE_PROVIDER_SITE_OTHER): Payer: Medicare Other | Admitting: Orthopedic Surgery

## 2018-07-10 ENCOUNTER — Encounter (INDEPENDENT_AMBULATORY_CARE_PROVIDER_SITE_OTHER): Payer: Self-pay | Admitting: Orthopedic Surgery

## 2018-07-10 VITALS — Ht 69.0 in | Wt 154.0 lb

## 2018-07-10 DIAGNOSIS — Z89511 Acquired absence of right leg below knee: Secondary | ICD-10-CM

## 2018-07-10 DIAGNOSIS — I2510 Atherosclerotic heart disease of native coronary artery without angina pectoris: Secondary | ICD-10-CM

## 2018-07-10 DIAGNOSIS — Z9861 Coronary angioplasty status: Secondary | ICD-10-CM | POA: Diagnosis not present

## 2018-07-10 NOTE — Progress Notes (Signed)
Office Visit Note   Patient: [name]           Date of Birth: 21-Jan-1934           MRN: [id number] Visit Date: 07/10/2018              Requested by: [name], [address], [place] [address] PCP: [name], MD  Chief Complaint  Patient presents with  . Right Leg - Routine Post Op    03/28/2018 right BKA      HPI: Patient is an 85 year old gentleman who presents status post right transtibial amputation.  Patient is 3-1/2 months out from surgery he is currently wearing a 2 extra-large stump shrinker working with [employer] for prosthetic fitting.  Assessment & Plan: Visit Diagnoses:  1. S/P BKA (below knee amputation) unilateral, right [facility])     Plan: Patient will follow up with biotech and then follow-up with [facility] for gait training.  Continue wearing the stump shrinker  Follow-Up Instructions: Return in about 3 weeks (around 07/31/2018).   Ortho Exam  Patient is alert, oriented, no adenopathy, well-dressed, normal affect, normal respiratory effort. Examination the left foot the avulsion of the third toenail is stable there is no redness no cellulitis patient's toenails were trimmed x2.  Examination the right transtibial amputation there is a wound laterally there is 1 cm in diameter 1 mm deep.  With gentle debridement there is good petechial bleeding around the wound edges no purulent drainage no signs of  infection.  Nfection.  Patient also has a small 5 mm diameter scab over the end of the incision.    Imaging: No results found. No images are attached to the encounter.  Labs: Lab Results  Component Value Date   HGBA1C 5.7 (H) 03/26/2018   HGBA1C 5.0 07/12/2017   HGBA1C 5.8 (H) 07/06/2015   ESRSEDRATE 24 (H) 03/24/2018   ESRSEDRATE 15 04/18/2017   ESRSEDRATE 5 09/17/2007   CRP 3.1 (H) 05/11/2018   CRP 1.8 (H) 03/24/2018   CRP <0.8 04/24/2017   LABURIC 6.0 03/09/2016   REPTSTATUS 05/14/2018 FINAL 05/09/2018   GRAMSTAIN  07/15/2017    WBC PRESENT,BOTH PMN AND MONONUCLEAR NO ORGANISMS SEEN Performed at [facility], [place] [address], [place], [place] [address]    CULT  05/09/2018    NO GROWTH 5 DAYS Performed at [facility] [address], [place], [place] [address]    LABORGA ESCHERICHIA COLI (A) 09/14/2017     Lab Results  Component Value Date   ALBUMIN 3.0 (L) 05/09/2018   ALBUMIN 2.5 (L) 03/31/2018   ALBUMIN 2.7 (L) 03/30/2018   PREALBUMIN 36 (H) 11/22/2017   PREALBUMIN 28.8 04/24/2017   LABURIC 6.0 03/09/2016    Body mass index is 22.74 kg/m.  Orders:  No orders of the defined types were placed in this encounter.  No orders of the defined types were placed in this encounter.    Procedures: No procedures performed  Clinical Data: No additional findings.  ROS:  All other systems negative, except as noted in the HPI. Review of Systems  Objective: Vital Signs: Ht 5\' 9"  (1.753 m)   Wt 154 lb (69.9 kg)   BMI 22.74 kg/m   Specialty Comments:  No specialty comments available.  PMFS History: Patient Active Problem List   Diagnosis Date Noted  . Wound infection 05/09/2018  . AKI (acute kidney injury) ([place])   . S/P BKA (below knee amputation) unilateral, right ([place])   .  Unilateral complete BKA, right, initial encounter ([place])   . Tachypnea   . Post-operative pain   . Subacute osteomyelitis, right ankle and foot ([place])   . Paralysis of right lower extremity ([place])   . TIA (transient ischemic attack) 03/24/2018  . Encephalopathy 03/24/2018  . Cellulitis 03/15/2018  . Hypernatremia 03/15/2018  . Persistent atrial fibrillation 11/10/2017  . Severe muscle deconditioning 11/10/2017  . Hemorrhage 10/22/2017  . CAD S/P percutaneous coronary angioplasty 10/22/2017  . Acute hypoxemic respiratory failure ([place]) 07/17/2017  . Aspiration syndrome, subsequent encounter 07/11/2017  . Aspiration pneumonia ([place]) 07/01/2017  . Acute encephalopathy 07/01/2017  . Thrombocytopenia ([place])  07/01/2017  . Chronic diastolic (congestive) heart failure ([place]) 07/01/2017  . Anemia of chronic disease 07/01/2017  . Troponin level elevated 07/01/2017  . Chronic atrial fibrillation 07/01/2017  . Goals of care, counseling/discussion   . Palliative care by specialist   . Acute metabolic encephalopathy 91/63/8466  . CKD (chronic kidney disease) stage 3, GFR 30-59 ml/min (HCC)   . CAP (community acquired pneumonia) 04/17/2017  . Hallux rigidus, right foot 03/10/2016  . Lumbosacral spondylosis without myelopathy 10/29/2015  . Memory difficulty 09/22/2015  . Abnormality of gait 09/22/2015  . Hyperglycemia 07/06/2015  . Chronic pain 07/06/2015  . Chronic insomnia 03/30/2015  . Transient alteration of awareness 03/30/2015  . Abnormal liver function   . Altered mental status 01/31/2015  . Essential hypertension 01/31/2015  . Constipation 01/31/2015  . Hypothyroidism 01/31/2015  . Seizure disorder ([place]) 01/31/2015  . Bladder outlet obstruction 01/31/2015  . GERD (gastroesophageal reflux disease) 01/31/2015  . Chronic back pain 01/31/2015  . Acute kidney injury ([place]) 01/31/2015   Past Medical History:  Diagnosis Date  . Abnormality of gait 09/22/2015  . Arthritis   . Atrial fibrillation ([place])   . CAD (coronary artery disease)    Stent to RCA, Penta stent, 99% reduced to 0% 2002.  . Cancer ([place])    skin CA removed from back  . Chronic insomnia 03/30/2015  . Complication of anesthesia    trouble waking up  . GERD (gastroesophageal reflux disease)   . Hypercholesteremia   . Hypertension   . Hypothyroidism   . Memory difficulty 09/22/2015  . Osteoarthritis   . Seizures ([place])   . Sepsis ([place]) 05/2017  . Transient alteration of awareness 03/30/2015  . Vertigo    hx of    Family History  Problem Relation Age of Onset  . Hypertension Mother   . Cancer Mother   . Kidney failure Father   . Heart disease Father     Past Surgical History:  Procedure Laterality Date  .  AMPUTATION Right 03/28/2018   Procedure: AMPUTATION BELOW KNEE;  Surgeon: Newt Minion, MD;  Location: [place];  Service: Orthopedics;  Laterality: Right;  . BACK SURGERY    . EYE SURGERY     Bilateral Cataract surgery   . HERNIA REPAIR    . I&D EXTREMITY Right 05/10/2015   Procedure: IRRIGATION AND DEBRIDEMENT EXTREMITY;  Surgeon: Leanora Cover, MD;  Location: [place];  Service: Orthopedics;  Laterality: Right;  . KNEE ARTHROPLASTY     right knee X 2; left knee once  . LAMINECTOMY     X 6  . POSTERIOR CERVICAL FUSION/FORAMINOTOMY  01/28/2012   Procedure: POSTERIOR CERVICAL FUSION/FORAMINOTOMY LEVEL 3;  Surgeon: [name], MD;  Location: [place] [facility];  Service: Neurosurgery;  Laterality: Left;  Posterior Cervical Five-Thoracic One Fusion, Arthrodesis with LEFT Cervical Seven-thoracic One Laminectomy,  Foraminotomy and Resection of Synovial Cyst  . POSTERIOR CERVICAL FUSION/FORAMINOTOMY N/A 01/29/2013   Procedure: POSTERIOR CERVICAL FUSION/FORAMINOTOMY LEVEL 1 and C2-5 Posteriolateral Arthrodesis;  Surgeon: [name], MD;  Location: [place] [facility];  Service: Neurosurgery;  Laterality: N/A;  C2-C3 Laminectomy C2-C3 posterior cervical arthrodesis  . TONSILLECTOMY     Social History   Occupational History  . Occupation: retired Software engineer  Tobacco Use  . Smoking status: Former Research scientist (life sciences)  . Smokeless tobacco: Never Used  Substance and Sexual Activity  . Alcohol use: Not Currently    Comment: rare  . Drug use: No  . Sexual activity: Not Currently

## 2018-07-16 NOTE — Telephone Encounter (Signed)
Already spoke to patient 06/19/18 see telephone note.

## 2018-07-21 ENCOUNTER — Telehealth: Payer: Self-pay | Admitting: Orthopedic Surgery

## 2018-07-21 NOTE — Telephone Encounter (Signed)
Helene Kelp called back and needs all records faxed. I faxed to her attn. [phone]

## 2018-07-21 NOTE — Telephone Encounter (Signed)
Received vm from [facility] Neuro[facility] stating that was her second time requesting records. She left return call number. IC her back and got her vm. I stated we have not received a previous request. Asked her to call back with a fax number and I would fax records.

## 2018-07-22 ENCOUNTER — Telehealth: Payer: Self-pay | Admitting: Cardiovascular Disease

## 2018-07-22 NOTE — Telephone Encounter (Signed)
[place], no smartphone, pre reg complete 07/22/18 AF

## 2018-07-24 ENCOUNTER — Encounter: Payer: Self-pay | Admitting: Cardiovascular Disease

## 2018-07-24 ENCOUNTER — Ambulatory Visit: Payer: Medicare Other | Admitting: Cardiovascular Disease

## 2018-07-24 ENCOUNTER — Telehealth (INDEPENDENT_AMBULATORY_CARE_PROVIDER_SITE_OTHER): Payer: Medicare Other | Admitting: Cardiovascular Disease

## 2018-07-24 VITALS — BP 146/79 | HR 80

## 2018-07-24 DIAGNOSIS — I10 Essential (primary) hypertension: Secondary | ICD-10-CM

## 2018-07-24 DIAGNOSIS — I110 Hypertensive heart disease with heart failure: Secondary | ICD-10-CM

## 2018-07-24 DIAGNOSIS — Z9861 Coronary angioplasty status: Secondary | ICD-10-CM

## 2018-07-24 DIAGNOSIS — I5032 Chronic diastolic (congestive) heart failure: Secondary | ICD-10-CM

## 2018-07-24 DIAGNOSIS — I482 Chronic atrial fibrillation, unspecified: Secondary | ICD-10-CM | POA: Diagnosis not present

## 2018-07-24 DIAGNOSIS — Z89511 Acquired absence of right leg below knee: Secondary | ICD-10-CM | POA: Diagnosis not present

## 2018-07-24 DIAGNOSIS — I2510 Atherosclerotic heart disease of native coronary artery without angina pectoris: Secondary | ICD-10-CM

## 2018-07-24 MED ORDER — FUROSEMIDE 20 MG PO TABS: 20.0000 mg | ORAL_TABLET | Freq: Every day | ORAL | 3 refills | Status: DC

## 2018-07-24 NOTE — Progress Notes (Signed)
Virtual Visit via Telephone Note   This visit type was conducted due to national recommendations for restrictions regarding the COVID-19 Pandemic (e.g. social distancing) in an effort to limit this patient's exposure and mitigate transmission in our community.  Due to his co-morbid illnesses, this patient is at least at moderate risk for complications without adequate follow up.  This format is felt to be most appropriate for this patient at this time.  The patient did not have access to video technology/had technical difficulties with video requiring transitioning to audio format only (telephone).  All issues noted in this document were discussed and addressed.  No physical exam could be performed with this format.  Please refer to the patient's chart for his  consent to telehealth for [facility].   Evaluation Performed:  Follow-up visit  Date:  07/26/2018   ID:  [name], DOB 1933/03/16, MRN [id number]  Patient Location: Home Provider Location: Other:  [facility]  PCP:  Robert Battles, MD  Cardiologist:  Robert Klein, MD  Electrophysiologist:  None   Chief Complaint:  CHF follow up  History of Present Illness:    [name] is a 85 y.o. 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 His recovery was slow.  He called a week ago with complaints of sleepiness and weakness. His BP was low. His HR was consistently in the 70s. We reduced his diltiazem dose and switched from doxazosin to tamsulosin.  Since those changes his blood pressure has been better, typically in the 130/60-70 range and his heart rate has remained well controlled in the 60s and 70s.  Despite that he is still feeling rather  sleepy during the day.  He is wondering if the Depakote dose could be reduced.  I wonder whether the hydrocodone may have something to do with this as well.  Labs performed very recently at [place] showed a creatinine of 1.65, which is higher than his baseline which is around 1.3 and his BUN was 48 compared to a typical baseline around 30.  His potassium was normal at 4.1.  Hemoglobin was 11.1.  Last December his TSH was in normal range.  Anticoagulation with Eliquis was discontinued after he developed a spontaneous bleed in his left leg and dropped his hemoglobin substantially.  He is now taking only aspirin.  He is unable to stand on a scale for daily weights.  We are using a rather nonconventional method of adjusting his diuretics based on his calf caliber.  Left calf circumference equivalent for "dry weight" is a calf circumference of 14 inches.  [name] always measures in the same location that is well marked by an old surgical scar. His weight is also now a new target due to the amputation, obviously lower than before.  Echo in January 2020 showed LVEF 55-60%.  The patient does not have symptoms concerning for COVID-19 infection (fever, chills, cough, or new shortness of breath).    Past Medical History:  Diagnosis Date  . Abnormality of gait 09/22/2015  . Arthritis   . Atrial fibrillation ([place])   . CAD (coronary artery disease)    Stent to RCA, Penta stent, 99% reduced to 0% 2002.  . Cancer ([place])  skin CA removed from back  . Chronic insomnia 03/30/2015  . Complication of anesthesia    trouble waking up  . GERD (gastroesophageal reflux disease)   . Hypercholesteremia   . Hypertension   . Hypothyroidism   . Memory difficulty 09/22/2015  . Osteoarthritis   . Seizures ([place])   . Sepsis ([place]) 05/2017  . Transient alteration of awareness 03/30/2015  . Vertigo    hx of   Past Surgical History:  Procedure Laterality Date  . AMPUTATION Right 03/28/2018   Procedure:  AMPUTATION BELOW KNEE;  Surgeon: Newt Minion, MD;  Location: [place];  Service: Orthopedics;  Laterality: Right;  . BACK SURGERY    . EYE SURGERY     Bilateral Cataract surgery   . HERNIA REPAIR    . I&D EXTREMITY Right 05/10/2015   Procedure: IRRIGATION AND DEBRIDEMENT EXTREMITY;  Surgeon: Leanora Cover, MD;  Location: [place];  Service: Orthopedics;  Laterality: Right;  . KNEE ARTHROPLASTY     right knee X 2; left knee once  . LAMINECTOMY     X 6  . POSTERIOR CERVICAL FUSION/FORAMINOTOMY  01/28/2012   Procedure: POSTERIOR CERVICAL FUSION/FORAMINOTOMY LEVEL 3;  Surgeon: [name], MD;  Location: [place] [facility];  Service: Neurosurgery;  Laterality: Left;  Posterior Cervical Five-Thoracic One Fusion, Arthrodesis with LEFT Cervical Seven-thoracic One Laminectomy, Foraminotomy and Resection of Synovial Cyst  . POSTERIOR CERVICAL FUSION/FORAMINOTOMY N/A 01/29/2013   Procedure: POSTERIOR CERVICAL FUSION/FORAMINOTOMY LEVEL 1 and C2-5 Posteriolateral Arthrodesis;  Surgeon: [name], MD;  Location: [place] [facility];  Service: Neurosurgery;  Laterality: N/A;  C2-C3 Laminectomy C2-C3 posterior cervical arthrodesis  . TONSILLECTOMY       Current Meds  Medication Sig  . alendronate (FOSAMAX) 70 MG tablet Take 70 mg by mouth every Wednesday. Remain upright for 30-60 minutes.  Marland Kitchen aspirin EC 81 MG tablet Take 81 mg by mouth daily.  . calcium carbonate (OSCAL) 1500 (600 Ca) MG TABS tablet Take 600 mg of elemental calcium by mouth daily after breakfast.  . Cholecalciferol (VITAMIN D) 125 MCG (5000 UT) CAPS Take 1 capsule by mouth daily.  . cyanocobalamin 500 MCG tablet Take 500 mcg by mouth at bedtime. Vitamin B12  . diclofenac sodium (VOLTAREN) 1 % GEL Apply 2 g topically daily as needed (pain).  Marland Kitchen diltiazem (CARDIZEM CD) 240 MG 24 hr capsule Take 240 mg every morning   Hold if systolic blood pressure is less than 100.  . divalproex (DEPAKOTE) 250 MG DR tablet Take 250-500 mg by  mouth See admin instructions. Take one tablet (250 mg) by mouth every morning and two tablets (500 mg) every evening (for epilepsy)  . doxazosin (CARDURA) 4 MG tablet Take 2 mg by mouth at bedtime.  Marland Kitchen ezetimibe (ZETIA) 10 MG tablet Take 5 mg by mouth at bedtime.  Marland Kitchen HYDROcodone-acetaminophen (NORCO) 10-325 MG tablet Take 1 tablet by mouth every 4 (four) hours as needed for moderate pain.  Marland Kitchen levothyroxine (SYNTHROID, LEVOTHROID) 150 MCG tablet Take 150 mcg by mouth daily before breakfast.  . Lidocaine (ASPERCREME LIDOCAINE) 4 % PTCH Apply 1 patch topically 2 (two) times daily as needed.   . metoprolol tartrate (LOPRESSOR) 25 MG tablet Take 1 tablet (25mg ) by mouth twice daily. Hold med for SBP<90 & DBP<50 Per Dr. Sallyanne Kuster  . Multiple Vitamins-Minerals (CERTAVITE/ANTIOXIDANTS) TABS Take 1 tablet by mouth every evening. With Iron 18 mg/ folic acid 621 mcg  . nitroGLYCERIN (NITROSTAT) 0.4 MG SL tablet Place 1 tablet (  0.4 mg total) under the tongue every 5 (five) minutes as needed for chest pain (call 911 afer 3 doses and if chest pain persists).  Marland Kitchen omeprazole (PRILOSEC) 20 MG capsule Take 20 mg by mouth daily before breakfast.   . polyethylene glycol (MIRALAX / GLYCOLAX) packet Take 17 g by mouth daily as needed for mild constipation.  . potassium chloride SA (K-DUR) 20 MEQ tablet Take 1/2 tablet ( 10 mEq ) by mouth daily  . pravastatin (PRAVACHOL) 40 MG tablet Take 40 mg by mouth every evening.   . predniSONE (DELTASONE) 5 MG tablet Take 2 tablets (10 mg total) by mouth daily with breakfast.  . [DISCONTINUED] furosemide (LASIX) 40 MG tablet Take 1/2 tablet ( 20 mg ) by mouth daily     Allergies:   Eliquis [apixaban]; Demerol [meperidine]; Keppra [levetiracetam]; and Tamsulosin   Social History   Tobacco Use  . Smoking status: Former Research scientist (life sciences)  . Smokeless tobacco: Never Used  Substance Use Topics  . Alcohol use: Not Currently    Comment: rare  . Drug use: No     Family Hx: The patient's  family history includes Cancer in his mother; Heart disease in his father; Hypertension in his mother; Kidney failure in his father.  ROS:   Please see the history of present illness.     All other systems reviewed and are negative.   Prior CV studies:   The following studies were reviewed today:  03/25/2018 ECHO  - Left ventricle: The cavity size was normal. Wall thickness was   increased in a pattern of moderate LVH. Systolic function was   normal. The estimated ejection fraction was in the range of 55%   to 60%. Left ventricular diastolic function parameters were   normal. - Aortic valve: There was mild regurgitation. - Mitral valve: Calcified annulus. Mildly thickened leaflets .   There was mild regurgitation. Valve area by pressure half-time:   1.04 cm^2. - Left atrium: The atrium was mildly dilated. - Atrial septum: No defect or patent foramen ovale was identified.   Labs/Other Tests and Data Reviewed:    EKG:  An ECG dated 05/09/2018 was personally reviewed today and demonstrated:  atrial fibrillation, QTc 481 ms  Recent Labs: 04/23/2018: NT-Pro BNP 3,612 05/09/2018: ALT 43 05/12/2018: BUN 36; Creatinine, Ser 1.56; Hemoglobin 8.2; Magnesium 2.0; Platelets 186; Potassium 4.1; Sodium 140   Recent Lipid Panel Lab Results  Component Value Date/Time   CHOL 88 03/26/2018 04:45 AM   TRIG 75 03/26/2018 04:45 AM   HDL 38 (L) 03/26/2018 04:45 AM   CHOLHDL 2.3 03/26/2018 04:45 AM   LDLCALC 35 03/26/2018 04:45 AM    Wt Readings from Last 3 Encounters:  07/10/18 154 lb (69.9 kg)  06/19/18 154 lb (69.9 kg)  06/04/18 154 lb (69.9 kg)     Objective:    Vital Signs:  BP (!) 146/79 Comment: wife will give it to Dr.C  Pulse 80    Unable to examine  ASSESSMENT & PLAN:    1. CHF (Chronic diastolic): He is not having any signs or symptoms of heart failure.  We are unable to weigh him, his calf circumference is 13 inches, less than the 14 inch sign post that we have used to  detect hypervolemia.  I would leave on the current dose of diuretics. 2. AFib: Remains well rate controlled after reducing the dose of diltiazem.  Unable to receive anticoagulants due to severe bleeding complications.  He can stop aspirin for 5-7  days in anticipation of the epidural injection that is planned in the near future. 3. CKD 3: Suspect that his creatinine is a little better now that we reduce his dose of diuretic.  Labs are planned at his next follow-up with [name]. 4. CAD: He denies angina.  Excellent lipid profile on combination pravastatin and ezetimibe. 5. S/P R BKA: Unable to use his previous weight for comparison. 6. Orthostatic hypotension: Unfortunately, following his amputation he is really unable to stand, so not an active issue.  I am pleased that we have been able to reduce his diuretic and antihypertensive medications, which should afford him an extra buffer to avoid syncope. 7. Severe deconditioning: Encouraged him to continue efforts with physical therapy.  Muscle weakness compounded by chronic steroid therapy.  He may require stress doses of steroids if he develops a serious illness   COVID-19 Education: The signs and symptoms of COVID-19 were discussed with the patient and how to seek care for testing (follow up with PCP or arrange E-visit).  The importance of social distancing was discussed today.  Time:   Today, I have spent 24 minutes with the patient with telehealth technology discussing the above problems.     Medication Adjustments/Labs and Tests Ordered: Current medicines are reviewed at length with the patient today.  Concerns regarding medicines are outlined above.   Tests Ordered: Orders Placed This Encounter  Procedures  . Basic metabolic panel    Medication Changes: Meds ordered this encounter  Medications  . furosemide (LASIX) 20 MG tablet    Sig: Take 1 tablet (20 mg total) by mouth daily.    Dispense:  90 tablet    Refill:  3     Disposition:  Follow up 4-6 weeks e-visit  Signed, Robert Klein, MD  07/26/2018 5:08 PM    [facility]

## 2018-07-24 NOTE — Patient Instructions (Addendum)
Medication Instructions:  Continue same medications If you need a refill on your cardiac medications before your next appointment, please call your pharmacy.   Lab work: Bmet to be done before next appointment with Dr.Croitoru  Lab order enclosed   Testing/Procedures: None ordered  Follow-Up: At [facility], you and your health needs are our priority.  As part of our continuing mission to provide you with exceptional heart care, we have created designated Provider Care Teams.  These Care Teams include your primary Cardiologist (physician) and Advanced Practice Providers (APPs -  Physician Assistants and Nurse Practitioners) who all work together to provide you with the care you need, when you need it. . Follow up appointment with Dr.Croitoru   Tuesday 10/21/18 at 2:00 pm

## 2018-07-26 ENCOUNTER — Encounter: Payer: Self-pay | Admitting: Cardiovascular Disease

## 2018-07-29 ENCOUNTER — Ambulatory Visit: Payer: Self-pay

## 2018-07-30 ENCOUNTER — Other Ambulatory Visit: Payer: Self-pay

## 2018-07-30 NOTE — Patient Outreach (Addendum)
[facility]) Care Management  07/30/2018  [name] 1933/04/16 [id number]   Called member at preferred number and HIPPA identities verified. Spoke to member along with member's spouse [name] on [employer].   Member states that he has been "ok, given the circumstances". Member's spouse stated that member had virtual visit with his cardiologist for CHF on 07-24-2018. Cardiologist notation stating that member's weight is measured via calf circumference. Family denies any signs and symptoms of CHF exacerbation.  Next appointment scheduled tomorrow with Dr. [name], Orthopedic and member will follow up for Cortisone injection to left shoulder. [name] stated that she continues to provide care for RBKA stump and cleans with saline and soap. On 07-10-2018, Provider visit stating that no signs and symptoms of infection to RBKA. Member and spouse stated that member continues to wear prosthetic fitting-shrinker as ordered.   Family stated that lasix dosage was decreased by Provider order.   Home RN/PT: family stated that due to COVID-19, no visitors are permitted into member's home. [name] stated that she is assisting member with home exercises and stated that she and member and neighbors practice safety when transporting member into and out of her car to visit Providers.   Meals: Family states that member has food and denies any concerns. [name] stated that she shops and prepares meals for member.  COVID-19: family states compliance with social distancing and handwashing. [name] stated that she wears her mask and gloves when shopping and she avoids [employer] due to large crowds. Member denies signs and symptoms of COVID-19.  Member stated that he prefers decline continued [facility] RN CM services at this time. [name] stated that member has been doing well and stated that she has a [facility] magnet on her refrigerator with the nurse on call line for assistance if needed. [name]  stated that she checks member's vital signs twice a week and stated that member is sleeping well. Family refuses services; however, voiced appreciation for all care. Family made aware that assistance is available if needed at later time and family verbalized agreement and understanding.   Will close case at this time. Will send case closure letter to PCP. Care Plan completed. Will notify Health Coach of Case Closure.  THN CM Care Plan Problem One     Most Recent Value  Care Plan Problem One  Risk for readmission r/t recent hospitalization  Role Documenting the Problem One  Care Management [place] for Problem One  Active  THN Long Term Goal   Member will not be readmitted within next 31 days  THN Long Term Goal Start Date  05/29/18  [facility] Long Term Goal Met Date  07/30/18  [facility] CM Short Term Goal #1   member will take medications as prescribed for next 30 days  THN CM Short Term Goal #1 Start Date  05/29/18  [facility] CM Short Term Goal #1 Met Date  07/30/18  THN CM Short Term Goal #2   Member will remain free from s/s of infection at R BKA within next 30 days  THN CM Short Term Goal #2 Start Date  05/29/18  [facility] CM Short Term Goal #2 Met Date  07/30/18  THN CM Short Term Goal #3  Member will be free from s/s of coronavirus  THN CM Short Term Goal #3 Start Date  06/12/18  [facility] CM Short Term Goal #3 Met Date  07/30/18    [facility] CM Care Plan Problem Two     Most  Recent Value  Care Plan Problem Two  Knowledge Deficit related to CHF as evidenced by referral received for CHF   Role Documenting the Problem Two  Care Management Coordinator  Care Plan for Problem Two  Active  THN Long Term Goal  Member wil not experience exacerbation of CHF for next 31 days  THN Long Term Goal Start Date  05/29/18  [facility] Long Term Goal Met Date  07/30/18  [facility] CM Short Term Goal #1   family will discuss plans for weighing member daily within next 2 weeks  THN CM Short Term Goal #1 Start Date  05/29/18  [facility] CM  Short Term Goal #1 Met Date   07/30/18  [facility] for Short Term Goal #2   discussed that family unable to weigh member due to [place] .    THN CM Care Plan Problem Three     Most Recent Value  Care Plan Problem Three  Knowledge Deficit related to COVID-19 and impact on patient self-Health management  Role Documenting the Problem Three  Care Management [place] for Problem Three  Active  THN Long Term Goal   Member will not be admitted to hospital for next 31 days related to [place] Term Goal Start Date  06/19/18  [facility] Long Term Goal Met Date  07/30/18  THN CM Short Term Goal #1   "Over the next 31 days, patient will verbalize basic understanding of COVID-19 impact on individual health and self-health management as evidenced by verbalization of basic understanding of COVID-19 as a viral disease, measures to prevent exposure, signs and symptoms, when to contact provider  [facility] CM Short Term Goal #1 Start Date  06/19/18  [facility] CM Short Term Goal #1 Met Date  07/30/18      Robert MolaANN" [name], [name]  [place] Management  Community Care Management Coordinator  [phone] [name]

## 2018-07-31 ENCOUNTER — Ambulatory Visit: Payer: Medicare Other | Admitting: Orthopedic Surgery

## 2018-08-07 ENCOUNTER — Ambulatory Visit (INDEPENDENT_AMBULATORY_CARE_PROVIDER_SITE_OTHER): Payer: Medicare Other | Admitting: Orthopedic Surgery

## 2018-08-07 ENCOUNTER — Other Ambulatory Visit: Payer: Self-pay

## 2018-08-07 ENCOUNTER — Encounter: Payer: Self-pay | Admitting: Orthopedic Surgery

## 2018-08-07 VITALS — Ht 69.0 in | Wt 154.0 lb

## 2018-08-07 DIAGNOSIS — Z9861 Coronary angioplasty status: Secondary | ICD-10-CM | POA: Diagnosis not present

## 2018-08-07 DIAGNOSIS — Z89511 Acquired absence of right leg below knee: Secondary | ICD-10-CM | POA: Diagnosis not present

## 2018-08-07 DIAGNOSIS — I2510 Atherosclerotic heart disease of native coronary artery without angina pectoris: Secondary | ICD-10-CM

## 2018-08-07 NOTE — Progress Notes (Signed)
Office Visit Note   Patient: [name]           Date of Birth: [birth year]           MRN: [id number] Visit Date: 08/07/2018              Requested by: [name], [place], [place] [id number] PCP: [name], MD  Chief Complaint  Patient presents with  . Right Knee - Routine Post Op    03/28/2018 right knee Knee      HPI: Patient is an 85 year old gentleman who presents 4 months status post right transtibial amputation.  He has been wearing a stump shrinker.  Still has 2 persistent wounds.  Assessment & Plan: Visit Diagnoses:  1. S/P BKA (below knee amputation) unilateral, right ([place])     Plan: The residual limb is well consolidated.  We will have him start the prosthetic fitting with biotech.  Anticipate by the time the fitting has completed the ulcers have completely healed.  Follow-Up Instructions: Return in about 4 weeks (around 09/04/2018).   Ortho Exam  Patient is alert, oriented, no adenopathy, well-dressed, normal affect, normal respiratory effort. Examination of the residual limb is well consolidated.  Anteriorly over the residual limb he has an area of 2 x 3 mm and 1 mm deep of granulation tissue the scab is removed the ulcer does not probe to bone or tendon.  There is no redness no cellulitis no drainage.  Laterally patient has a wound that is 10 mm in diameter about 5 mm deep this also has no further depth there is healthy granulation tissue there is no visible retained suture no odor no drainage no signs of infection.  Imaging: No results found. No images are attached to the encounter.  Labs: Lab Results  Component Value Date   HGBA1C 5.7 (H) 03/26/2018   HGBA1C 5.0 07/12/2017   HGBA1C 5.8 (H) 07/06/2015   ESRSEDRATE 24 (H) 03/24/2018   ESRSEDRATE 15 04/18/2017   ESRSEDRATE 5 09/17/2007   CRP 3.1 (H) 05/11/2018   CRP 1.8 (H) 03/24/2018   CRP <0.8 04/24/2017   LABURIC 6.0 03/09/2016   REPTSTATUS 05/14/2018 FINAL  05/09/2018   GRAMSTAIN  07/15/2017    WBC PRESENT,BOTH PMN AND MONONUCLEAR NO ORGANISMS SEEN Performed at [facility], [place] [address] [place], [place] [address]    CULT  05/09/2018    NO GROWTH 5 DAYS Performed at [facility] [address] [place], [place] [address]    LABORGA ESCHERICHIA COLI (A) 09/14/2017     Lab Results  Component Value Date   ALBUMIN 3.0 (L) 05/09/2018   ALBUMIN 2.5 (L) 03/31/2018   ALBUMIN 2.7 (L) 03/30/2018   PREALBUMIN 36 (H) 11/22/2017   PREALBUMIN 28.8 04/24/2017   LABURIC 6.0 03/09/2016    Body mass index is 22.74 kg/m.  Orders:  No orders of the defined types were placed in this encounter.  No orders of the defined types were placed in this encounter.    Procedures: No procedures performed  Clinical Data: No additional findings.  ROS:  All other systems negative, except as noted in the HPI. Review of Systems  Objective: Vital Signs: Ht 5\' 9"  (1.753 m)   Wt 154 lb (69.9 kg)   BMI 22.74 kg/m   Specialty Comments:  No specialty comments available.  PMFS History: Patient Active Problem List   Diagnosis Date Noted  . Wound infection 05/09/2018  . AKI (acute kidney  injury) ([place])   . S/P BKA (below knee amputation) unilateral, right ([place])   . Unilateral complete BKA, right, initial encounter ([place])   . Tachypnea   . Post-operative pain   . Subacute osteomyelitis, right ankle and foot ([place])   . Paralysis of right lower extremity ([place])   . TIA (transient ischemic attack) 03/24/2018  . Encephalopathy 03/24/2018  . Cellulitis 03/15/2018  . Hypernatremia 03/15/2018  . Persistent atrial fibrillation 11/10/2017  . Severe muscle deconditioning 11/10/2017  . Hemorrhage 10/22/2017  . CAD S/P percutaneous coronary angioplasty 10/22/2017  . Acute hypoxemic respiratory failure ([place]) 07/17/2017  . Aspiration syndrome, subsequent encounter 07/11/2017  . Aspiration pneumonia ([place]) 07/01/2017  . Acute encephalopathy  07/01/2017  . Thrombocytopenia ([place]) 07/01/2017  . Chronic diastolic (congestive) heart failure ([place]) 07/01/2017  . Anemia of chronic disease 07/01/2017  . Troponin level elevated 07/01/2017  . Chronic atrial fibrillation 07/01/2017  . Goals of care, counseling/discussion   . Palliative care by specialist   . Acute metabolic encephalopathy 87/86/7672  . CKD (chronic kidney disease) stage 3, GFR 30-59 ml/min (HCC)   . CAP (community acquired pneumonia) 04/17/2017  . Hallux rigidus, right foot 03/10/2016  . Lumbosacral spondylosis without myelopathy 10/29/2015  . Memory difficulty 09/22/2015  . Abnormality of gait 09/22/2015  . Hyperglycemia 07/06/2015  . Chronic pain 07/06/2015  . Chronic insomnia 03/30/2015  . Transient alteration of awareness 03/30/2015  . Abnormal liver function   . Altered mental status 01/31/2015  . Essential hypertension 01/31/2015  . Constipation 01/31/2015  . Hypothyroidism 01/31/2015  . Seizure disorder ([place]) 01/31/2015  . Bladder outlet obstruction 01/31/2015  . GERD (gastroesophageal reflux disease) 01/31/2015  . Chronic back pain 01/31/2015  . Acute kidney injury ([place]) 01/31/2015   Past Medical History:  Diagnosis Date  . Abnormality of gait 09/22/2015  . Arthritis   . Atrial fibrillation ([place])   . CAD (coronary artery disease)    Stent to RCA, Penta stent, 99% reduced to 0% 2002.  . Cancer ([place])    skin CA removed from back  . Chronic insomnia 03/30/2015  . Complication of anesthesia    trouble waking up  . GERD (gastroesophageal reflux disease)   . Hypercholesteremia   . Hypertension   . Hypothyroidism   . Memory difficulty 09/22/2015  . Osteoarthritis   . Seizures ([place])   . Sepsis ([place]) 05/2017  . Transient alteration of awareness 03/30/2015  . Vertigo    hx of    Family History  Problem Relation Age of Onset  . Hypertension Mother   . Cancer Mother   . Kidney failure Father   . Heart disease Father     Past Surgical History:   Procedure Laterality Date  . AMPUTATION Right 03/28/2018   Procedure: AMPUTATION BELOW KNEE;  Surgeon: Newt Minion, MD;  Location: [facility];  Service: Orthopedics;  Laterality: Right;  . BACK SURGERY    . EYE SURGERY     Bilateral Cataract surgery   . HERNIA REPAIR    . I&D EXTREMITY Right 05/10/2015   Procedure: IRRIGATION AND DEBRIDEMENT EXTREMITY;  Surgeon: Leanora Cover, MD;  Location: [facility];  Service: Orthopedics;  Laterality: Right;  . KNEE ARTHROPLASTY     right knee X 2; left knee once  . LAMINECTOMY     X 6  . POSTERIOR CERVICAL FUSION/FORAMINOTOMY  01/28/2012   Procedure: POSTERIOR CERVICAL FUSION/FORAMINOTOMY LEVEL 3;  Surgeon: [name], MD;  Location: [facility];  Service: Neurosurgery;  Laterality: Left;  Posterior Cervical Five-Thoracic One Fusion, Arthrodesis with LEFT Cervical Seven-thoracic One Laminectomy, Foraminotomy and Resection of Synovial Cyst  . POSTERIOR CERVICAL FUSION/FORAMINOTOMY N/A 01/29/2013   Procedure: POSTERIOR CERVICAL FUSION/FORAMINOTOMY LEVEL 1 and C2-5 Posteriolateral Arthrodesis;  Surgeon: [name], MD;  Location: [facility];  Service: Neurosurgery;  Laterality: N/A;  C2-C3 Laminectomy C2-C3 posterior cervical arthrodesis  . TONSILLECTOMY     Social History   Occupational History  . Occupation: retired Software engineer  Tobacco Use  . Smoking status: Former Research scientist (life sciences)  . Smokeless tobacco: Never Used  Substance and Sexual Activity  . Alcohol use: Not Currently    Comment: rare  . Drug use: No  . Sexual activity: Not Currently

## 2018-08-09 ENCOUNTER — Telehealth: Payer: Self-pay

## 2018-08-09 NOTE — Telephone Encounter (Signed)
Telephone call to Patient to offer Covid testing.patient states he will like to discuss with his wife.  Provided call back number.

## 2018-08-25 ENCOUNTER — Ambulatory Visit: Payer: Medicare Other | Admitting: Neurology

## 2018-08-27 ENCOUNTER — Ambulatory Visit (INDEPENDENT_AMBULATORY_CARE_PROVIDER_SITE_OTHER): Payer: Medicare Other | Admitting: Neurology

## 2018-08-27 ENCOUNTER — Other Ambulatory Visit: Payer: Self-pay

## 2018-08-27 ENCOUNTER — Encounter: Payer: Self-pay | Admitting: Neurology

## 2018-08-27 VITALS — BP 146/64 | HR 70 | Temp 97.8°F | Ht 69.0 in | Wt 155.0 lb

## 2018-08-27 DIAGNOSIS — I2510 Atherosclerotic heart disease of native coronary artery without angina pectoris: Secondary | ICD-10-CM

## 2018-08-27 DIAGNOSIS — G40009 Localization-related (focal) (partial) idiopathic epilepsy and epileptic syndromes with seizures of localized onset, not intractable, without status epilepticus: Secondary | ICD-10-CM | POA: Diagnosis not present

## 2018-08-27 DIAGNOSIS — Z9861 Coronary angioplasty status: Secondary | ICD-10-CM | POA: Diagnosis not present

## 2018-08-27 MED ORDER — LACOSAMIDE 50 MG PO TABS: ORAL_TABLET | ORAL | 0 refills | Status: DC

## 2018-08-27 MED ORDER — VIMPAT 100 MG PO TABS: 1.0000 | ORAL_TABLET | Freq: Two times a day (BID) | ORAL | 0 refills | Status: DC

## 2018-08-27 NOTE — Progress Notes (Signed)
NEUROLOGY CONSULTATION NOTE  [name] MRN: [id number] DOB: 06/06/1933  Referring provider: Dr. [name] Primary care provider: Dr. [name]  Reason for consult:  seizures  Dear Dr Philip Aspen:  Thank you for your kind referral of [name] for consultation of the above symptoms. Although his history is well known to you, please allow me to reiterate it for the purpose of our medical record. The patient was accompanied to the clinic by his wife who also provides collateral information. Records and images were personally reviewed where available.  HISTORY OF PRESENT ILLNESS: This is a pleasant 85 year old right-handed man with a history of atrial fibrillation, hypertension, cognitive impairment, chronic pain, s/p right BKA, presenting for evaluation of seizures. He was previously seeing neurologist Dr. Jannifer Franklin, records were reviewed. His last visit was in 2017. His wife started noticing staring spells in 2014 or 2015 where he would stare straight ahead for 10-15 seconds. The episodes increased in frequency and he started seeing Dr. Jannifer Franklin. He was started on Levetiracetam which made him feel "awful." He self-discontinued medication then had a convulsion in April 2017. His wife heard grunting noises and found him halfway in the bathroom closet tangled up with his walker. EMS witnessed a convulsion en route to the hospital. He had an EEG in 06/2015 which reported occasional epileptiform discharges often in a wide field, but predominantly the left hemisphere. He was started on Depakote which caused drowsiness, dose reduced over time to Depakote DR 250mg  1 tab in AM, 2 tabs in PM. His wife has not seen any staring spells since 2018 when he had a 20 second episode during dinner, until he was admitted in January 2020 for altered mental status. Per notes, he had a staring episode lasting 5-15 minutes and di not return to baseline so she called EMS. He was also having more right leg  swelling due to a non-healing ulcer and cellulitis. In the ER, his Depakote level was 37. He had an MRI brain without contrast in January 2020 which I personally reviewed, there was a punctate acute infarct in the right cerebellum, age related atrophy with moderate chronic microvascular disease. EEG was normal. His wife denies any further staring spells but patient that he is drowsy all the time with the Depakote, it is hard to stay alert. His wife feels it is due to him being sedentary and eating a lot of sugar. He has been more confused, not quite understanding things. He has had more difficulties balancing his checkbook over the past year, this has improved some but his wife still sees occasional confusion. He has trouble pulling up names of people and medications. His wife manages medications. He has chronic back pain and phantom limb pain and was previously taking 4 hydrocodone daily. He had an epidural injection 5 days ago which has helped a lot, he has cut down hydrocodone to 2-3 a day, and his wife notes he is more alert, learning to play chess. He denies any olfactory/gustatory hallucinations, deja vu, rising epigastric sensation, focal numbness/tingling/weakness (except for right phantom limb pain), myoclonic jerks. He uses a wheelchair for mobility. He denies any headaches, dizziness, diplopia, dysarthria/dysphagia, bowel/bladder dysfunction. Sleep is good. He had a normal birth and early development.  There is no history of febrile convulsions, CNS infections such as meningitis/encephalitis, significant traumatic brain injury, neurosurgical procedures, or family history of seizures.  Prior AEDs: Levetiracetam  PAST MEDICAL HISTORY: Past Medical History:  Diagnosis Date  . Abnormality of  gait 09/22/2015  . Arthritis   . Atrial fibrillation ([place])   . CAD (coronary artery disease)    Stent to RCA, Penta stent, 99% reduced to 0% 2002.  . Cancer ([place])    skin CA removed from back  . Chronic  insomnia 03/30/2015  . Complication of anesthesia    trouble waking up  . GERD (gastroesophageal reflux disease)   . Hypercholesteremia   . Hypertension   . Hypothyroidism   . Memory difficulty 09/22/2015  . Osteoarthritis   . Seizures ([place])   . Sepsis ([place]) 05/2017  . Transient alteration of awareness 03/30/2015  . Vertigo    hx of    PAST SURGICAL HISTORY: Past Surgical History:  Procedure Laterality Date  . AMPUTATION Right 03/28/2018   Procedure: AMPUTATION BELOW KNEE;  Surgeon: Newt Minion, MD;  Location: [facility];  Service: Orthopedics;  Laterality: Right;  . BACK SURGERY    . EYE SURGERY     Bilateral Cataract surgery   . HERNIA REPAIR    . I&D EXTREMITY Right 05/10/2015   Procedure: IRRIGATION AND DEBRIDEMENT EXTREMITY;  Surgeon: Leanora Cover, MD;  Location: [facility];  Service: Orthopedics;  Laterality: Right;  . KNEE ARTHROPLASTY     right knee X 2; left knee once  . LAMINECTOMY     X 6  . POSTERIOR CERVICAL FUSION/FORAMINOTOMY  01/28/2012   Procedure: POSTERIOR CERVICAL FUSION/FORAMINOTOMY LEVEL 3;  Surgeon: [name], MD;  Location: [facility];  Service: Neurosurgery;  Laterality: Left;  Posterior Cervical Five-Thoracic One Fusion, Arthrodesis with LEFT Cervical Seven-thoracic One Laminectomy, Foraminotomy and Resection of Synovial Cyst  . POSTERIOR CERVICAL FUSION/FORAMINOTOMY N/A 01/29/2013   Procedure: POSTERIOR CERVICAL FUSION/FORAMINOTOMY LEVEL 1 and C2-5 Posteriolateral Arthrodesis;  Surgeon: [name], MD;  Location: [facility];  Service: Neurosurgery;  Laterality: N/A;  C2-C3 Laminectomy C2-C3 posterior cervical arthrodesis  . TONSILLECTOMY      MEDICATIONS: Current Outpatient Medications on File Prior to Visit  Medication Sig Dispense Refill  . alendronate (FOSAMAX) 70 MG tablet Take 70 mg by mouth every Wednesday. Remain upright for 30-60 minutes.    Marland Kitchen aspirin EC 81 MG tablet Take 81 mg by mouth daily.    . calcium  carbonate (OSCAL) 1500 (600 Ca) MG TABS tablet Take 600 mg of elemental calcium by mouth daily after breakfast.    . Cholecalciferol (VITAMIN D) 125 MCG (5000 UT) CAPS Take 1 capsule by mouth daily.    . cyanocobalamin 500 MCG tablet Take 500 mcg by mouth at bedtime. Vitamin B12    . diclofenac sodium (VOLTAREN) 1 % GEL Apply 2 g topically daily as needed (pain).    Marland Kitchen diltiazem (CARDIZEM CD) 240 MG 24 hr capsule Take 240 mg every morning   Hold if systolic blood pressure is less than 100. 90 capsule 3  . divalproex (DEPAKOTE) 250 MG DR tablet Take 250-500 mg by mouth See admin instructions. Take one tablet (250 mg) by mouth every morning and two tablets (500 mg) every evening (for epilepsy)    . doxazosin (CARDURA) 4 MG tablet Take 2 mg by mouth at bedtime.    Marland Kitchen ezetimibe (ZETIA) 10 MG tablet Take 5 mg by mouth at bedtime.    . furosemide (LASIX) 20 MG tablet Take 1 tablet (20 mg total) by mouth daily. 90 tablet 3  . HYDROcodone-acetaminophen (NORCO) 10-325 MG tablet Take 1 tablet by mouth every 4 (four) hours as needed for moderate pain.    Marland Kitchen  levothyroxine (SYNTHROID, LEVOTHROID) 150 MCG tablet Take 150 mcg by mouth daily before breakfast.    . Lidocaine (ASPERCREME LIDOCAINE) 4 % PTCH Apply 1 patch topically 2 (two) times daily as needed.     . metoprolol tartrate (LOPRESSOR) 25 MG tablet Take 1 tablet (25mg ) by mouth twice daily. Hold med for SBP<90 & DBP<50 Per Dr. Sallyanne Kuster    . Multiple Vitamins-Minerals (CERTAVITE/ANTIOXIDANTS) TABS Take 1 tablet by mouth every evening. With Iron 18 mg/ folic acid 638 mcg    . nitroGLYCERIN (NITROSTAT) 0.4 MG SL tablet Place 1 tablet (0.4 mg total) under the tongue every 5 (five) minutes as needed for chest pain (call 911 afer 3 doses and if chest pain persists). 25 tablet 11  . omeprazole (PRILOSEC) 20 MG capsule Take 20 mg by mouth daily before breakfast.     . polyethylene glycol (MIRALAX / GLYCOLAX) packet Take 17 g by mouth daily as needed for mild  constipation. 14 each 0  . potassium chloride SA (K-DUR) 20 MEQ tablet Take 1/2 tablet ( 10 mEq ) by mouth daily 90 tablet 3  . pravastatin (PRAVACHOL) 40 MG tablet Take 40 mg by mouth every evening.   2  . predniSONE (DELTASONE) 5 MG tablet Take 2 tablets (10 mg total) by mouth daily with breakfast.     No current facility-administered medications on file prior to visit.     ALLERGIES: Allergies  Allergen Reactions  . Eliquis [Apixaban] Other (See Comments)    bleeding  . Demerol [Meperidine] Nausea And Vomiting  . Keppra [Levetiracetam] Other (See Comments)    Causes sleepiness, mental status changes  . Tamsulosin     Pt stated he had decreased urination when switching from doxazosin to tamsulosin    FAMILY HISTORY: Family History  Problem Relation Age of Onset  . Hypertension Mother   . Cancer Mother   . Kidney failure Father   . Heart disease Father     SOCIAL HISTORY: Social History   Socioeconomic History  . Marital status: Married    Spouse name: [name]  . Number of children: 3  . Years of education: 76  . Highest education level: Not on file  Occupational History  . Occupation: retired Software engineer  Social Needs  . Financial resource strain: Not hard at all  . Food insecurity    Worry: Never true    Inability: Never true  . Transportation needs    Medical: No    Non-medical: No  Tobacco Use  . Smoking status: Former Research scientist (life sciences)  . Smokeless tobacco: Never Used  Substance and Sexual Activity  . Alcohol use: Not Currently    Comment: rare  . Drug use: No  . Sexual activity: Not Currently  Lifestyle  . Physical activity    Days per week: Not on file    Minutes per session: Not on file  . Stress: Only a little  Relationships  . Social Herbalist on phone: Once a week    Gets together: Not on file    Attends religious service: Not on file    Active member of club or organization: No    Attends meetings of clubs or organizations: Never     Relationship status: Married  . Intimate partner violence    Fear of current or ex partner: Not on file    Emotionally abused: Not on file    Physically abused: Not on file    Forced sexual activity: Not on file  Other  Topics Concern  . Not on file  Social History Narrative   Lives at home w/ his wife [name]   Patient drinks 4-5 cups of coffee daily.   Patient is right handed.     REVIEW OF SYSTEMS: Constitutional: No fevers, chills, or sweats, no generalized fatigue, change in appetite Eyes: No visual changes, double vision, eye pain Ear, nose and throat: No hearing loss, ear pain, nasal congestion, sore throat Cardiovascular: No chest pain, palpitations Respiratory:  No shortness of breath at rest or with exertion, wheezes GastrointestinaI: No nausea, vomiting, diarrhea, abdominal pain, fecal incontinence Genitourinary:  No dysuria, urinary retention or frequency Musculoskeletal:  No neck pain,+ back pain Integumentary: No rash, pruritus, skin lesions Neurological: as above Psychiatric: No depression, insomnia, anxiety Endocrine: No palpitations, fatigue, diaphoresis, mood swings, change in appetite, change in weight, increased thirst Hematologic/Lymphatic:  No anemia, purpura, petechiae. Allergic/Immunologic: no itchy/runny eyes, nasal congestion, recent allergic reactions, rashes  PHYSICAL EXAM: Vitals:   08/27/18 1307  BP: (!) 146/64  Pulse: 70  Temp: 97.8 F (36.6 C)  SpO2: 100%   General: No acute distress, sitting on wheelchair, s/p right BKA Head:  Normocephalic/atraumatic Neck: supple, no paraspinal tenderness, full range of motion Heart: regular rate and rhythm Lungs: Clear to auscultation bilaterally. Vascular: No carotid bruits. Skin/Extremities: No rash, no edema Neurological Exam: Mental status: alert and oriented to person, place, and time, no dysarthria or aphasia, Fund of knowledge is appropriate.  Recent and remote memory are intact.  Attention and  concentration are normal.    Able to name objects and repeat phrases.  Cranial nerves: CN I: not tested CN II: pupils equal, round and reactive to light, visual fields intact CN III, IV, VI:  full range of motion, no nystagmus, no ptosis CN V: facial sensation intact CN VII: upper and lower face symmetric CN VIII: hearing intact to conversation CN IX, X: gag intact, uvula midline CN XI: sternocleidomastoid and trapezius muscles intact CN XII: tongue midline Bulk & Tone: normal, no fasciculations. Motor: 5/5 throughout (s/p right BKA) with no pronator drift. Sensation: intact to light touch, cold.  No extinction to double simultaneous stimulation Deep Tendon Reflexes: +1 throughout (s/p right BKA), no ankle clonus on left Plantar responses: downgoing on left Cerebellar: no incoordination on finger to nose testing Gait: not tested, wheelchair-bound s/p right BKA Tremor: none  IMPRESSION: This is a pleasant 85 year old right-handed man with a history of atrial fibrillation, hypertension, cognitive impairment, chronic pain, s/p right BKA, presenting for evaluation of seizures. He has a history of recurrent staring spells and had a witnessed convulsion in 2017. EEG in 2017 reported left hemisphere epileptiform discharges. He was admitted in January 2020 for altered mental status with report of staring, prior to this the last staring spell was in 2018. No further seizures since Jan 2020 however he continues to report drowsiness on low dose Depakote (subtherapeutic due to low dose), unable to increase dose further. We discussed switching to a different AED that hopefully will not cause as much sedation, samples for Vimpat were given, start low dose 25mg  BID x 1 week, then increase to 50mg  BID x 1 week, then 75mg  BID x 1 week, then 100mg  BID. Once on this dose, we will start weaning off the Depakote by 1 tab every week. He does not drive. Follow-up in 3 months, they know to call for any changes.     Thank you for allowing me to participate in the care of this  patient. Please do not hesitate to call for any questions or concerns.   [name], M.D.  CC: [name]

## 2018-08-27 NOTE — Patient Instructions (Signed)
1. Start Vimpat 25mg  twice a day for 1 week, then increase to 50mg  twice a day for 1 week, then increase to 75mg  twice a day for 1 week, then increase to 100mg  twice a day  2. Continue Depakote 250mg  1 tab in AM, 2 tabs in PM for another month, then once on Vimpat 100mg  twice a day, reduce Depakote to 1 tab twice a day for 1 week, then 1 tab every night a week, then stop  3. Follow-up in 3 months, cal for any changes  Seizure Precautions: 1. If medication has been prescribed for you to prevent seizures, take it exactly as directed.  Do not stop taking the medicine without talking to your doctor first, even if you have not had a seizure in a long time.   2. Avoid activities in which a seizure would cause danger to yourself or to others.  Don't operate dangerous machinery, swim alone, or climb in high or dangerous places, such as on ladders, roofs, or girders.  Do not drive unless your doctor says you may.  3. If you have any warning that you may have a seizure, lay down in a safe place where you can't hurt yourself.    4.  No driving for 6 months from last seizure, as per [facility].   Please refer to the following link on the [facility] website for more information: http://[email or web address].org/[email or web address]/driving/drivingu.cfm   5.  Maintain good sleep hygiene. Avoid alcohol.  6.  Contact your doctor if you have any problems that may be related to the medicine you are taking.  7.  Call 911 and bring the patient back to the ED if:        A.  The seizure lasts longer than 5 minutes.       B.  The patient doesn't awaken shortly after the seizure  C.  The patient has new problems such as difficulty seeing, speaking or moving  D.  The patient was injured during the seizure  E.  The patient has a temperature over 102 F (39C)  F.  The patient vomited and now is having trouble breathing   FALL PRECAUTIONS: Be cautious when walking. Scan the area  for obstacles that may increase the risk of trips and falls. When getting up in the mornings, sit up at the edge of the bed for a few minutes before getting out of bed. Consider elevating the bed at the head end to avoid drop of blood pressure when getting up. Walk always in a well-lit room (use night lights in the walls). Avoid area rugs or power cords from appliances in the middle of the walkways. Use a walker or a cane if necessary and consider physical therapy for balance exercise. Get your eyesight checked regularly.  FINANCIAL OVERSIGHT: Supervision, especially oversight when making financial decisions or transactions is also recommended.  HOME SAFETY: Consider the safety of the kitchen when operating appliances like stoves, microwave oven, and blender. Consider having supervision and share cooking responsibilities until no longer able to participate in those. Accidents with firearms and other hazards in the house should be identified and addressed as well.  DRIVING: Regarding driving, in patients with progressive memory problems, driving will be impaired. We advise to have someone else do the driving if trouble finding directions or if minor accidents are reported. Independent driving assessment is available to determine safety of driving.  ABILITY TO BE LEFT ALONE: If patient is unable to contact 911  operator, consider using LifeLine, or when the need is there, arrange for someone to stay with patients. Smoking is a fire hazard, consider supervision or cessation. Risk of wandering should be assessed by caregiver and if detected at any point, supervision and safe proof recommendations should be instituted.  MEDICATION SUPERVISION: Inability to self-administer medication needs to be constantly addressed. Implement a mechanism to ensure safe administration of the medications.  RECOMMENDATIONS FOR ALL PATIENTS WITH MEMORY PROBLEMS: 1. Continue to exercise (Recommend 30 minutes of walking everyday,  or 3 hours every week) 2. Increase social interactions - continue going to [place] and enjoy social gatherings with friends and family 3. Eat healthy, avoid fried foods and eat more fruits and vegetables 4. Maintain adequate blood pressure, blood sugar, and blood cholesterol level. Reducing the risk of stroke and cardiovascular disease also helps promoting better memory. 5. Avoid stressful situations. Live a simple life and avoid aggravations. Organize your time and prepare for the next day in anticipation. 6. Sleep well, avoid any interruptions of sleep and avoid any distractions in the bedroom that may interfere with adequate sleep quality 7. Avoid sugar, avoid sweets as there is a strong link between excessive sugar intake, diabetes, and cognitive impairment We discussed the Mediterranean diet, which has been shown to help patients reduce the risk of progressive memory disorders and reduces cardiovascular risk. This includes eating fish, eat fruits and green leafy vegetables, nuts like almonds and hazelnuts, walnuts, and also use olive oil. Avoid fast foods and fried foods as much as possible. Avoid sweets and sugar as sugar use has been linked to worsening of memory function.  There is always a concern of gradual progression of memory problems. If this is the case, then we may need to adjust level of care according to patient needs. Support, both to the patient and caregiver, should then be put into place.

## 2018-08-28 ENCOUNTER — Encounter: Payer: Self-pay | Admitting: Orthopedic Surgery

## 2018-08-28 ENCOUNTER — Ambulatory Visit (INDEPENDENT_AMBULATORY_CARE_PROVIDER_SITE_OTHER): Payer: Medicare Other | Admitting: Orthopedic Surgery

## 2018-08-28 VITALS — Ht 69.0 in | Wt 155.0 lb

## 2018-08-28 DIAGNOSIS — Z89511 Acquired absence of right leg below knee: Secondary | ICD-10-CM

## 2018-08-28 NOTE — Progress Notes (Signed)
Office Visit Note   Patient: [name]           Date of Birth: 12-Jan-1934           MRN: [id number] Visit Date: 08/28/2018              Requested by: [name], [place],  [place] [address] PCP: [name], MD  Chief Complaint  Patient presents with  . Right Leg - Follow-up    03/28/18 right BKA      HPI: Patient is a 85 year old gentleman who is 5 months status post right transtibial amputation.  Patient is following up with [facility] for prosthetic fitting he states he still has 1 small ulcer laterally over the residual limb he is currently taking protein supplements at least twice a day.  Assessment & Plan: Visit Diagnoses:  1. S/P BKA (below knee amputation) unilateral, right (HCC)     Plan: The ulcer was debrided of skin and soft tissue back to bleeding viable tissue he will start dry dressing changes daily starting tomorrow continue with his stump shrinker continue with protein supplements.  Follow-up with [facility] for prosthetic fitting.  Follow-Up Instructions: Return if symptoms worsen or fail to improve.   Ortho Exam  Patient is alert, oriented, no adenopathy, well-dressed, normal affect, normal respiratory effort. Examination patient's residual limb is well consolidated well-healed there is no redness no cellulitis no odor no drainage he has 1 small open ulcer laterally that is 5 mm in diameter and 5 mm deep.  After informed consent a 15 blade knife was used to remove the fibrous tissue this was debrided back to bleeding viable tissue Iodosorb dressing was applied.  Imaging: No results found. No images are attached to the encounter.  Labs: Lab Results  Component Value Date   HGBA1C 5.7 (H) 03/26/2018   HGBA1C 5.0 07/12/2017   HGBA1C 5.8 (H) 07/06/2015   ESRSEDRATE 24 (H) 03/24/2018   ESRSEDRATE 15 04/18/2017   ESRSEDRATE 5 09/17/2007   CRP 3.1 (H) 05/11/2018   CRP 1.8 (H) 03/24/2018   CRP <0.8 04/24/2017   LABURIC 6.0  03/09/2016   REPTSTATUS 05/14/2018 FINAL 05/09/2018   GRAMSTAIN  07/15/2017    WBC PRESENT,BOTH PMN AND MONONUCLEAR NO ORGANISMS SEEN Performed at [facility], [place] [address], [place], [place] [address]    CULT  05/09/2018    NO GROWTH 5 DAYS Performed at [facility] [address] [place], [place] [address]    LABORGA ESCHERICHIA COLI (A) 09/14/2017     Lab Results  Component Value Date   ALBUMIN 3.0 (L) 05/09/2018   ALBUMIN 2.5 (L) 03/31/2018   ALBUMIN 2.7 (L) 03/30/2018   PREALBUMIN 36 (H) 11/22/2017   PREALBUMIN 28.8 04/24/2017   LABURIC 6.0 03/09/2016    Body mass index is 22.89 kg/m.  Orders:  No orders of the defined types were placed in this encounter.  No orders of the defined types were placed in this encounter.    Procedures: No procedures performed  Clinical Data: No additional findings.  ROS:  All other systems negative, except as noted in the HPI. Review of Systems  Objective: Vital Signs: Ht 5\' 9"  (1.753 m)   Wt 155 lb (70.3 kg)   BMI 22.89 kg/m   Specialty Comments:  No specialty comments available.  PMFS History: Patient Active Problem List   Diagnosis Date Noted  . Wound infection 05/09/2018  . AKI (acute kidney injury) ([place])   .  S/P BKA (below knee amputation) unilateral, right ([place])   . Unilateral complete BKA, right, initial encounter ([place])   . Tachypnea   . Post-operative pain   . Subacute osteomyelitis, right ankle and foot ([place])   . Paralysis of right lower extremity ([place])   . TIA (transient ischemic attack) 03/24/2018  . Encephalopathy 03/24/2018  . Cellulitis 03/15/2018  . Hypernatremia 03/15/2018  . Persistent atrial fibrillation 11/10/2017  . Severe muscle deconditioning 11/10/2017  . Hemorrhage 10/22/2017  . CAD S/P percutaneous coronary angioplasty 10/22/2017  . Acute hypoxemic respiratory failure ([place]) 07/17/2017  . Aspiration syndrome, subsequent encounter 07/11/2017  . Aspiration pneumonia  ([place]) 07/01/2017  . Acute encephalopathy 07/01/2017  . Thrombocytopenia ([place]) 07/01/2017  . Chronic diastolic (congestive) heart failure ([place]) 07/01/2017  . Anemia of chronic disease 07/01/2017  . Troponin level elevated 07/01/2017  . Chronic atrial fibrillation 07/01/2017  . Goals of care, counseling/discussion   . Palliative care by specialist   . Acute metabolic encephalopathy 79/15/0569  . CKD (chronic kidney disease) stage 3, GFR 30-59 ml/min (HCC)   . CAP (community acquired pneumonia) 04/17/2017  . Hallux rigidus, right foot 03/10/2016  . Lumbosacral spondylosis without myelopathy 10/29/2015  . Memory difficulty 09/22/2015  . Abnormality of gait 09/22/2015  . Hyperglycemia 07/06/2015  . Chronic pain 07/06/2015  . Chronic insomnia 03/30/2015  . Transient alteration of awareness 03/30/2015  . Abnormal liver function   . Altered mental status 01/31/2015  . Essential hypertension 01/31/2015  . Constipation 01/31/2015  . Hypothyroidism 01/31/2015  . Seizure disorder ([place]) 01/31/2015  . Bladder outlet obstruction 01/31/2015  . GERD (gastroesophageal reflux disease) 01/31/2015  . Chronic back pain 01/31/2015  . Acute kidney injury ([place]) 01/31/2015   Past Medical History:  Diagnosis Date  . Abnormality of gait 09/22/2015  . Arthritis   . Atrial fibrillation ([place])   . CAD (coronary artery disease)    Stent to RCA, Penta stent, 99% reduced to 0% 2002.  . Cancer ([place])    skin CA removed from back  . Chronic insomnia 03/30/2015  . Complication of anesthesia    trouble waking up  . GERD (gastroesophageal reflux disease)   . Hypercholesteremia   . Hypertension   . Hypothyroidism   . Memory difficulty 09/22/2015  . Osteoarthritis   . Seizures ([employer])   . Sepsis ([place]) 05/2017  . Transient alteration of awareness 03/30/2015  . Vertigo    hx of    Family History  Problem Relation Age of Onset  . Hypertension Mother   . Cancer Mother   . Kidney failure Father   . Heart  disease Father     Past Surgical History:  Procedure Laterality Date  . AMPUTATION Right 03/28/2018   Procedure: AMPUTATION BELOW KNEE;  Surgeon: Newt Minion, MD;  Location: [facility];  Service: Orthopedics;  Laterality: Right;  . BACK SURGERY    . EYE SURGERY     Bilateral Cataract surgery   . HERNIA REPAIR    . I&D EXTREMITY Right 05/10/2015   Procedure: IRRIGATION AND DEBRIDEMENT EXTREMITY;  Surgeon: Leanora Cover, MD;  Location: [facility];  Service: Orthopedics;  Laterality: Right;  . KNEE ARTHROPLASTY     right knee X 2; left knee once  . LAMINECTOMY     X 6  . POSTERIOR CERVICAL FUSION/FORAMINOTOMY  01/28/2012   Procedure: POSTERIOR CERVICAL FUSION/FORAMINOTOMY LEVEL 3;  Surgeon: [name], MD;  Location: [facility];  Service: Neurosurgery;  Laterality: Left;  Posterior  Cervical Five-Thoracic One Fusion, Arthrodesis with LEFT Cervical Seven-thoracic One Laminectomy, Foraminotomy and Resection of Synovial Cyst  . POSTERIOR CERVICAL FUSION/FORAMINOTOMY N/A 01/29/2013   Procedure: POSTERIOR CERVICAL FUSION/FORAMINOTOMY LEVEL 1 and C2-5 Posteriolateral Arthrodesis;  Surgeon: [name], MD;  Location: [facility];  Service: Neurosurgery;  Laterality: N/A;  C2-C3 Laminectomy C2-C3 posterior cervical arthrodesis  . TONSILLECTOMY     Social History   Occupational History  . Occupation: retired Software engineer  Tobacco Use  . Smoking status: Former Research scientist (life sciences)  . Smokeless tobacco: Never Used  Substance and Sexual Activity  . Alcohol use: Not Currently    Comment: rare  . Drug use: No  . Sexual activity: Not Currently

## 2018-09-05 ENCOUNTER — Telehealth: Payer: Self-pay | Admitting: Neurology

## 2018-09-05 NOTE — Telephone Encounter (Signed)
Is this with the increase to 50mg  BID? If yes, reduce back to 25mg  BID for another week. Thanks

## 2018-09-05 NOTE — Telephone Encounter (Signed)
Pt wife called back no answer, no voice mail set up

## 2018-09-05 NOTE — Telephone Encounter (Signed)
Pt wife called back she will cut the Vimpat back down to 25 mg BID for a week then try to increase it again, will call and follow up on how he is doing,  Pt will need more samples with the decrease in medication to help with the tapering up when he gets to a higher dose

## 2018-09-05 NOTE — Telephone Encounter (Signed)
Wife is calling in about the Vimpat is making him extremely sleepy. She wasn't sure what to do. He is asleep most of the day without energy to finish a full meal. Thanks!

## 2018-09-09 ENCOUNTER — Encounter: Payer: Self-pay | Admitting: Neurology

## 2018-09-11 ENCOUNTER — Ambulatory Visit (INDEPENDENT_AMBULATORY_CARE_PROVIDER_SITE_OTHER): Payer: Medicare Other | Admitting: Physician Assistant

## 2018-09-11 ENCOUNTER — Encounter: Payer: Self-pay | Admitting: Orthopedic Surgery

## 2018-09-11 ENCOUNTER — Other Ambulatory Visit: Payer: Self-pay

## 2018-09-11 VITALS — Ht 69.0 in | Wt 155.0 lb

## 2018-09-11 DIAGNOSIS — I5032 Chronic diastolic (congestive) heart failure: Secondary | ICD-10-CM

## 2018-09-11 DIAGNOSIS — Z89511 Acquired absence of right leg below knee: Secondary | ICD-10-CM

## 2018-09-11 DIAGNOSIS — N183 Chronic kidney disease, stage 3 unspecified: Secondary | ICD-10-CM

## 2018-09-11 MED ORDER — MUPIROCIN 2 % EX OINT: 1.0000 "application " | TOPICAL_OINTMENT | Freq: Every day | CUTANEOUS | 0 refills | Status: DC

## 2018-09-11 NOTE — Progress Notes (Signed)
Office Visit Note   Patient: [name]           Date of Birth: 1933-09-20           MRN: [id number] Visit Date: 09/11/2018              Requested by: [name], [place],  [place] [address] PCP: [name], MD  Chief Complaint  Patient presents with  . Right Leg - Follow-up    03/28/2018 right BKA      HPI: The patient is a 85 year old gentleman who is 5-1/2 months status post right transtibial amputation.  He has been [facility] with [facility] for prosthetic fitting but they have not been able to fit as yet due to a small residual ulcer.  He is taking protein supplements twice a day.    Assessment & Plan: Visit Diagnoses:  1. S/P BKA (below knee amputation) unilateral, right ([place])   2. CKD (chronic kidney disease) stage 3, GFR 30-59 ml/min (HCC)   3. Chronic diastolic (congestive) heart failure (HCC)     Plan: After informed consent the lateral ulcer was debrided with a #10 blade knife to healthy appearing bleeding tissue by Dr. [name]. The outcome of today's visit and treatment plan was discussed with the patient and his wife following the visit.  They will clean the area daily with soap and water and then apply mupirocin ointment and a Band-Aid daily and then his shrinker stocking.  They will follow-up in 3 weeks or sooner should he have any difficulties in the interim.  Follow-Up Instructions: Return in about 3 weeks (around 10/02/2018).   Ortho Exam  Patient is alert, oriented, no adenopathy, well-dressed, normal affect, normal respiratory effort. The right transtibial amputation site has a residual ulcer laterally.  After informed consent this was debrided with a #10 blade knife by Dr. [name] including debridement of tendon and soft tissue with the residual wound being 1 cm diameter by approximately 5 mm of depth.  There is no sign of infection or cellulitis in the flaps.  Iodosorb was applied to the area following the debridement and  dry gauze was applied and then the shrinker stocking.  The patient will follow-up in several weeks.  Imaging: No results found. No images are attached to the encounter.  Labs: Lab Results  Component Value Date   HGBA1C 5.7 (H) 03/26/2018   HGBA1C 5.0 07/12/2017   HGBA1C 5.8 (H) 07/06/2015   ESRSEDRATE 24 (H) 03/24/2018   ESRSEDRATE 15 04/18/2017   ESRSEDRATE 5 09/17/2007   CRP 3.1 (H) 05/11/2018   CRP 1.8 (H) 03/24/2018   CRP <0.8 04/24/2017   LABURIC 6.0 03/09/2016   REPTSTATUS 05/14/2018 FINAL 05/09/2018   GRAMSTAIN  07/15/2017    WBC PRESENT,BOTH PMN AND MONONUCLEAR NO ORGANISMS SEEN Performed at [facility], [place] [address], [place], [place] [address]    CULT  05/09/2018    NO GROWTH 5 DAYS Performed at [facility] [address] [place], [place] [address]    LABORGA ESCHERICHIA COLI (A) 09/14/2017     Lab Results  Component Value Date   ALBUMIN 3.0 (L) 05/09/2018   ALBUMIN 2.5 (L) 03/31/2018   ALBUMIN 2.7 (L) 03/30/2018   PREALBUMIN 36 (H) 11/22/2017   PREALBUMIN 28.8 04/24/2017   LABURIC 6.0 03/09/2016    Lab Results  Component Value Date   MG 2.0 05/12/2018   MG 1.9 05/11/2018   MG 1.9 03/31/2018  No results found for: VD25OH  Lab Results  Component Value Date   PREALBUMIN 36 (H) 11/22/2017   PREALBUMIN 28.8 04/24/2017   CBC EXTENDED Latest Ref Rng & Units 05/12/2018 05/11/2018 05/10/2018  WBC 4.0 - 10.5 K/uL 6.2 6.0 6.4  RBC 4.22 - 5.81 MIL/uL 2.72(L) 2.73(L) 2.95(L)  HGB 13.0 - 17.0 g/dL 8.2(L) 8.3(L) 8.8(L)  HCT 39.0 - 52.0 % 26.4(L) 26.6(L) 28.7(L)  PLT 150 - 400 K/uL 186 195 210  NEUTROABS 1.7 - 7.7 K/uL 4.0 3.8 -  LYMPHSABS 0.7 - 4.0 K/uL 1.1 1.1 -     Body mass index is 22.89 kg/m.  Orders:  No orders of the defined types were placed in this encounter.  Meds ordered this encounter  Medications  . mupirocin ointment (BACTROBAN) 2 %    Sig: Apply 1 application topically daily. Apply to right lateral leg wound daily     Dispense:  22 g    Refill:  0     Procedures: No procedures performed  Clinical Data: No additional findings.  ROS:  All other systems negative, except as noted in the HPI. Review of Systems  Objective: Vital Signs: Ht 5\' 9"  (1.753 m)   Wt 155 lb (70.3 kg)   BMI 22.89 kg/m   Specialty Comments:  No specialty comments available.  PMFS History: Patient Active Problem List   Diagnosis Date Noted  . Wound infection 05/09/2018  . AKI (acute kidney injury) ([place])   . S/P BKA (below knee amputation) unilateral, right ([place])   . Unilateral complete BKA, right, initial encounter ([place])   . Tachypnea   . Post-operative pain   . Subacute osteomyelitis, right ankle and foot ([place])   . Paralysis of right lower extremity ([place])   . TIA (transient ischemic attack) 03/24/2018  . Encephalopathy 03/24/2018  . Cellulitis 03/15/2018  . Hypernatremia 03/15/2018  . Persistent atrial fibrillation 11/10/2017  . Severe muscle deconditioning 11/10/2017  . Hemorrhage 10/22/2017  . CAD S/P percutaneous coronary angioplasty 10/22/2017  . Acute hypoxemic respiratory failure ([place]) 07/17/2017  . Aspiration syndrome, subsequent encounter 07/11/2017  . Aspiration pneumonia ([place]) 07/01/2017  . Acute encephalopathy 07/01/2017  . Thrombocytopenia ([place]) 07/01/2017  . Chronic diastolic (congestive) heart failure ([place]) 07/01/2017  . Anemia of chronic disease 07/01/2017  . Troponin level elevated 07/01/2017  . Chronic atrial fibrillation 07/01/2017  . Goals of care, counseling/discussion   . Palliative care by specialist   . Acute metabolic encephalopathy 29/47/6546  . CKD (chronic kidney disease) stage 3, GFR 30-59 ml/min (HCC)   . CAP (community acquired pneumonia) 04/17/2017  . Hallux rigidus, right foot 03/10/2016  . Lumbosacral spondylosis without myelopathy 10/29/2015  . Memory difficulty 09/22/2015  . Abnormality of gait 09/22/2015  . Hyperglycemia 07/06/2015  . Chronic pain 07/06/2015  .  Chronic insomnia 03/30/2015  . Transient alteration of awareness 03/30/2015  . Abnormal liver function   . Altered mental status 01/31/2015  . Essential hypertension 01/31/2015  . Constipation 01/31/2015  . Hypothyroidism 01/31/2015  . Seizure disorder ([place]) 01/31/2015  . Bladder outlet obstruction 01/31/2015  . GERD (gastroesophageal reflux disease) 01/31/2015  . Chronic back pain 01/31/2015  . Acute kidney injury ([place]) 01/31/2015   Past Medical History:  Diagnosis Date  . Abnormality of gait 09/22/2015  . Arthritis   . Atrial fibrillation ([place])   . CAD (coronary artery disease)    Stent to RCA, Penta stent, 99% reduced to 0% 2002.  . Cancer ([place])    skin CA removed from  back  . Chronic insomnia 03/30/2015  . Complication of anesthesia    trouble waking up  . GERD (gastroesophageal reflux disease)   . Hypercholesteremia   . Hypertension   . Hypothyroidism   . Memory difficulty 09/22/2015  . Osteoarthritis   . Seizures ([place])   . Sepsis ([place]) 05/2017  . Transient alteration of awareness 03/30/2015  . Vertigo    hx of    Family History  Problem Relation Age of Onset  . Hypertension Mother   . Cancer Mother   . Kidney failure Father   . Heart disease Father     Past Surgical History:  Procedure Laterality Date  . AMPUTATION Right 03/28/2018   Procedure: AMPUTATION BELOW KNEE;  Surgeon: Newt Minion, MD;  Location: [facility];  Service: Orthopedics;  Laterality: Right;  . BACK SURGERY    . EYE SURGERY     Bilateral Cataract surgery   . HERNIA REPAIR    . I&D EXTREMITY Right 05/10/2015   Procedure: IRRIGATION AND DEBRIDEMENT EXTREMITY;  Surgeon: Leanora Cover, MD;  Location: [facility];  Service: Orthopedics;  Laterality: Right;  . KNEE ARTHROPLASTY     right knee X 2; left knee once  . LAMINECTOMY     X 6  . POSTERIOR CERVICAL FUSION/FORAMINOTOMY  01/28/2012   Procedure: POSTERIOR CERVICAL FUSION/FORAMINOTOMY LEVEL 3;  Surgeon: [name], MD;   Location: [facility];  Service: Neurosurgery;  Laterality: Left;  Posterior Cervical Five-Thoracic One Fusion, Arthrodesis with LEFT Cervical Seven-thoracic One Laminectomy, Foraminotomy and Resection of Synovial Cyst  . POSTERIOR CERVICAL FUSION/FORAMINOTOMY N/A 01/29/2013   Procedure: POSTERIOR CERVICAL FUSION/FORAMINOTOMY LEVEL 1 and C2-5 Posteriolateral Arthrodesis;  Surgeon: [name], MD;  Location: [facility];  Service: Neurosurgery;  Laterality: N/A;  C2-C3 Laminectomy C2-C3 posterior cervical arthrodesis  . TONSILLECTOMY     Social History   Occupational History  . Occupation: retired Software engineer  Tobacco Use  . Smoking status: Former Research scientist (life sciences)  . Smokeless tobacco: Never Used  Substance and Sexual Activity  . Alcohol use: Not Currently    Comment: rare  . Drug use: No  . Sexual activity: Not Currently

## 2018-09-18 ENCOUNTER — Telehealth: Payer: Self-pay | Admitting: Orthopedic Surgery

## 2018-09-18 ENCOUNTER — Telehealth: Payer: Self-pay | Admitting: Neurology

## 2018-09-18 NOTE — Telephone Encounter (Signed)
Patient is scheduled 09/19/2018 at 12:30pm

## 2018-09-18 NOTE — Telephone Encounter (Signed)
Falling asleep while eating  Not alert, not clear headed. Pt does not understand wife when she gives him instructions. She constantly has to repeat herself.  Taking vimpat 17morning and 1 in the evening  Saturday will increase to 1.5 morning and evening.  If continues on 1.5 then a refill will be needed or samples.  Pt also takes Depakote.  Wife is concerned pt is taking too much medication

## 2018-09-18 NOTE — Telephone Encounter (Signed)
Wife is calling in about medication concerns. She left a VM. Please call her back. Thanks!

## 2018-09-18 NOTE — Telephone Encounter (Signed)
Patient's wife [name] called asked if Raquel Sarna will call he back concerning patient right stump. [name] said there is redness around the top of the stump and want to know if patient should come for an earlier appointment? The   Number to contact [name] is [phone]

## 2018-09-18 NOTE — Telephone Encounter (Signed)
Can you please call the pt and make an appt for tomorrow. Any changes to a surgical area should have evaluation.

## 2018-09-18 NOTE — Telephone Encounter (Signed)
Pls have her stop the Vimpat over the weekend and see if this improves, continue Depakote. Update [place] on Monday. Thanks

## 2018-09-19 ENCOUNTER — Other Ambulatory Visit: Payer: Self-pay

## 2018-09-19 ENCOUNTER — Encounter: Payer: Self-pay | Admitting: Physician Assistant

## 2018-09-19 ENCOUNTER — Ambulatory Visit (INDEPENDENT_AMBULATORY_CARE_PROVIDER_SITE_OTHER): Payer: Medicare Other | Admitting: Physician Assistant

## 2018-09-19 VITALS — Ht 69.0 in | Wt 155.0 lb

## 2018-09-19 DIAGNOSIS — Z89511 Acquired absence of right leg below knee: Secondary | ICD-10-CM

## 2018-09-19 DIAGNOSIS — N183 Chronic kidney disease, stage 3 unspecified: Secondary | ICD-10-CM

## 2018-09-19 DIAGNOSIS — I5032 Chronic diastolic (congestive) heart failure: Secondary | ICD-10-CM | POA: Diagnosis not present

## 2018-09-19 MED ORDER — DOXYCYCLINE HYCLATE 100 MG PO CAPS: 100.0000 mg | ORAL_CAPSULE | Freq: Two times a day (BID) | ORAL | 1 refills | Status: DC

## 2018-09-19 NOTE — Telephone Encounter (Signed)
Wife informed and appreciative.  She will call on Monday with update.

## 2018-09-19 NOTE — Progress Notes (Signed)
Office Visit Note   Patient: [name]           Date of Birth: 12-03-1933           MRN: [id number] Visit Date: 09/19/2018              Requested by: [name], [place],  [place] [address] PCP: [name], MD  Chief Complaint  Patient presents with  . Right Leg - Follow-up    03/28/18 right BKA, concern w/reness over stump area      HPI: The patient is an 85 year old gentleman who is seen for postoperative follow-up following a right transtibial amputation.  He has had a small lateral residual wound and the patient and wife were concerned that there was increasing erythema and increasing slough in the wound.  Assessment & Plan: Visit Diagnoses:  1. S/P BKA (below knee amputation) unilateral, right ([place])   2. CKD (chronic kidney disease) stage 3, GFR 30-59 ml/min (HCC)   3. Chronic diastolic (congestive) heart failure ([place])     Plan: We will begin doxycycline 100 mg p.o. twice daily.  Will have them discontinue the mupirocin ointment to the open area.  They should continue to clean the residual limb with soap and water daily and then pack the lateral open area with a dry gauze and secure with tape and then apply his Vive medical shrinker stocking on top dressing.  They will follow-up in 1 week.  Follow-Up Instructions: Return in about 1 week (around 09/26/2018).   Ortho Exam  Patient is alert, oriented, no adenopathy, well-dressed, normal affect, normal respiratory effort. The right transtibial amputation site has a open wound laterally which is approximately 50% pink tissue/50% yellow slough.  There is some mild periwound irritation.  He also has a very superficial open area over the central incision line which is approximately 1 cm in length and 2 mm in width and 1 mm in depth without signs of infection or cellulitis.  Will begin dry gauze to the lateral wound to try to debride some of the slough over the next week.  Imaging: No results  found. No images are attached to the encounter.  Labs: Lab Results  Component Value Date   HGBA1C 5.7 (H) 03/26/2018   HGBA1C 5.0 07/12/2017   HGBA1C 5.8 (H) 07/06/2015   ESRSEDRATE 24 (H) 03/24/2018   ESRSEDRATE 15 04/18/2017   ESRSEDRATE 5 09/17/2007   CRP 3.1 (H) 05/11/2018   CRP 1.8 (H) 03/24/2018   CRP <0.8 04/24/2017   LABURIC 6.0 03/09/2016   REPTSTATUS 05/14/2018 FINAL 05/09/2018   GRAMSTAIN  07/15/2017    WBC PRESENT,BOTH PMN AND MONONUCLEAR NO ORGANISMS SEEN Performed at [facility], [place] [address] [place], [place] [address]    CULT  05/09/2018    NO GROWTH 5 DAYS Performed at [facility] [address], [place], [place] [address]    LABORGA ESCHERICHIA COLI (A) 09/14/2017     Lab Results  Component Value Date   ALBUMIN 3.0 (L) 05/09/2018   ALBUMIN 2.5 (L) 03/31/2018   ALBUMIN 2.7 (L) 03/30/2018   PREALBUMIN 36 (H) 11/22/2017   PREALBUMIN 28.8 04/24/2017   LABURIC 6.0 03/09/2016    Lab Results  Component Value Date   MG 2.0 05/12/2018   MG 1.9 05/11/2018   MG 1.9 03/31/2018   No results found for: VD25OH  Lab Results  Component Value Date   PREALBUMIN 36 (H) 11/22/2017   PREALBUMIN 28.8  04/24/2017   CBC EXTENDED Latest Ref Rng & Units 05/12/2018 05/11/2018 05/10/2018  WBC 4.0 - 10.5 K/uL 6.2 6.0 6.4  RBC 4.22 - 5.81 MIL/uL 2.72(L) 2.73(L) 2.95(L)  HGB 13.0 - 17.0 g/dL 8.2(L) 8.3(L) 8.8(L)  HCT 39.0 - 52.0 % 26.4(L) 26.6(L) 28.7(L)  PLT 150 - 400 K/uL 186 195 210  NEUTROABS 1.7 - 7.7 K/uL 4.0 3.8 -  LYMPHSABS 0.7 - 4.0 K/uL 1.1 1.1 -     Body mass index is 22.89 kg/m.  Orders:  No orders of the defined types were placed in this encounter.  Meds ordered this encounter  Medications  . doxycycline (VIBRAMYCIN) 100 MG capsule    Sig: Take 1 capsule (100 mg total) by mouth 2 (two) times daily.    Dispense:  28 capsule    Refill:  1     Procedures: No procedures performed  Clinical Data: No additional findings.  ROS:   All other systems negative, except as noted in the HPI. Review of Systems  Objective: Vital Signs: Ht 5\' 9"  (1.753 m)   Wt 155 lb (70.3 kg)   BMI 22.89 kg/m   Specialty Comments:  No specialty comments available.  PMFS History: Patient Active Problem List   Diagnosis Date Noted  . Wound infection 05/09/2018  . AKI (acute kidney injury) ([place])   . S/P BKA (below knee amputation) unilateral, right ([place])   . Unilateral complete BKA, right, initial encounter ([place])   . Tachypnea   . Post-operative pain   . Subacute osteomyelitis, right ankle and foot ([place])   . Paralysis of right lower extremity ([place])   . TIA (transient ischemic attack) 03/24/2018  . Encephalopathy 03/24/2018  . Cellulitis 03/15/2018  . Hypernatremia 03/15/2018  . Persistent atrial fibrillation 11/10/2017  . Severe muscle deconditioning 11/10/2017  . Hemorrhage 10/22/2017  . CAD S/P percutaneous coronary angioplasty 10/22/2017  . Acute hypoxemic respiratory failure ([place]) 07/17/2017  . Aspiration syndrome, subsequent encounter 07/11/2017  . Aspiration pneumonia ([place]) 07/01/2017  . Acute encephalopathy 07/01/2017  . Thrombocytopenia ([place]) 07/01/2017  . Chronic diastolic (congestive) heart failure ([place]) 07/01/2017  . Anemia of chronic disease 07/01/2017  . Troponin level elevated 07/01/2017  . Chronic atrial fibrillation 07/01/2017  . Goals of care, counseling/discussion   . Palliative care by specialist   . Acute metabolic encephalopathy 47/42/5956  . CKD (chronic kidney disease) stage 3, GFR 30-59 ml/min (HCC)   . CAP (community acquired pneumonia) 04/17/2017  . Hallux rigidus, right foot 03/10/2016  . Lumbosacral spondylosis without myelopathy 10/29/2015  . Memory difficulty 09/22/2015  . Abnormality of gait 09/22/2015  . Hyperglycemia 07/06/2015  . Chronic pain 07/06/2015  . Chronic insomnia 03/30/2015  . Transient alteration of awareness 03/30/2015  . Abnormal liver function   . Altered mental status  01/31/2015  . Essential hypertension 01/31/2015  . Constipation 01/31/2015  . Hypothyroidism 01/31/2015  . Seizure disorder ([place]) 01/31/2015  . Bladder outlet obstruction 01/31/2015  . GERD (gastroesophageal reflux disease) 01/31/2015  . Chronic back pain 01/31/2015  . Acute kidney injury ([place]) 01/31/2015   Past Medical History:  Diagnosis Date  . Abnormality of gait 09/22/2015  . Arthritis   . Atrial fibrillation ([place])   . CAD (coronary artery disease)    Stent to RCA, Penta stent, 99% reduced to 0% 2002.  . Cancer ([place])    skin CA removed from back  . Chronic insomnia 03/30/2015  . Complication of anesthesia    trouble waking up  . GERD (gastroesophageal reflux  disease)   . Hypercholesteremia   . Hypertension   . Hypothyroidism   . Memory difficulty 09/22/2015  . Osteoarthritis   . Seizures ([place])   . Sepsis ([place]) 05/2017  . Transient alteration of awareness 03/30/2015  . Vertigo    hx of    Family History  Problem Relation Age of Onset  . Hypertension Mother   . Cancer Mother   . Kidney failure Father   . Heart disease Father     Past Surgical History:  Procedure Laterality Date  . AMPUTATION Right 03/28/2018   Procedure: AMPUTATION BELOW KNEE;  Surgeon: Newt Minion, MD;  Location: [facility];  Service: Orthopedics;  Laterality: Right;  . BACK SURGERY    . EYE SURGERY     Bilateral Cataract surgery   . HERNIA REPAIR    . I&D EXTREMITY Right 05/10/2015   Procedure: IRRIGATION AND DEBRIDEMENT EXTREMITY;  Surgeon: Leanora Cover, MD;  Location: [facility];  Service: Orthopedics;  Laterality: Right;  . KNEE ARTHROPLASTY     right knee X 2; left knee once  . LAMINECTOMY     X 6  . POSTERIOR CERVICAL FUSION/FORAMINOTOMY  01/28/2012   Procedure: POSTERIOR CERVICAL FUSION/FORAMINOTOMY LEVEL 3;  Surgeon: [name], MD;  Location: [facility];  Service: Neurosurgery;  Laterality: Left;  Posterior Cervical Five-Thoracic One Fusion, Arthrodesis with LEFT  Cervical Seven-thoracic One Laminectomy, Foraminotomy and Resection of Synovial Cyst  . POSTERIOR CERVICAL FUSION/FORAMINOTOMY N/A 01/29/2013   Procedure: POSTERIOR CERVICAL FUSION/FORAMINOTOMY LEVEL 1 and C2-5 Posteriolateral Arthrodesis;  Surgeon: [name], MD;  Location: [facility];  Service: Neurosurgery;  Laterality: N/A;  C2-C3 Laminectomy C2-C3 posterior cervical arthrodesis  . TONSILLECTOMY     Social History   Occupational History  . Occupation: retired Software engineer  Tobacco Use  . Smoking status: Former Research scientist (life sciences)  . Smokeless tobacco: Never Used  Substance and Sexual Activity  . Alcohol use: Not Currently    Comment: rare  . Drug use: No  . Sexual activity: Not Currently

## 2018-09-22 ENCOUNTER — Telehealth: Payer: Self-pay | Admitting: Neurology

## 2018-09-22 ENCOUNTER — Telehealth: Payer: Self-pay | Admitting: Orthopedic Surgery

## 2018-09-22 NOTE — Telephone Encounter (Signed)
Patient's wife [name] request a call back, she wants to know if she can go back to putting the mupirocin on the site instead of the other antibiotic she was prescribed by Shawn. She states patient can not tolerate it.

## 2018-09-22 NOTE — Telephone Encounter (Signed)
Patient wife was called and informed to go back using mupirocin, she understood.

## 2018-09-22 NOTE — Telephone Encounter (Signed)
Can you pls check when these problems started, when I had seen his last month he was telling me he felt drowsy on the Depakote but it did not seem to this level, are all of these new and when did they start. The Vimpat should be out of his system soon. Any infection, difficulty urinating, cough/cold/fever? Thanks

## 2018-09-22 NOTE — Telephone Encounter (Signed)
It sounds like when wife started titrating medication that is when the pts symptoms started.   Wife states that there are no signs of infection- UTI, no cold or fever. Pt is an amputee. An ATB was given recently , but pt could not tolerated due to vomiting. Wife continues to deny infection.

## 2018-09-22 NOTE — Telephone Encounter (Signed)
Vimpat update: She said that he was very overwhelmed. He would fall asleep with food in his mouth. He couldn't concentrate. He couldn't stay awake. She has already stopped the medication on Friday before evening dosage. Thanks!

## 2018-09-22 NOTE — Telephone Encounter (Signed)
Sounds like pt feels the same.

## 2018-09-23 NOTE — Telephone Encounter (Signed)
If all this is due to Vimpat, it should start improving. If there is no improvement in another day or so, would see his PCP to ensure there is nothing else going on. Thanks

## 2018-09-23 NOTE — Telephone Encounter (Signed)
I informed wife and pt to go ahead and contact PCP. Wife states that pt is still very sleepy even off the Vimpat. Wife understood.

## 2018-09-26 ENCOUNTER — Encounter: Payer: Self-pay | Admitting: Physician Assistant

## 2018-09-26 ENCOUNTER — Ambulatory Visit (INDEPENDENT_AMBULATORY_CARE_PROVIDER_SITE_OTHER): Payer: Medicare Other | Admitting: Physician Assistant

## 2018-09-26 VITALS — Ht 69.0 in | Wt 155.0 lb

## 2018-09-26 DIAGNOSIS — N183 Chronic kidney disease, stage 3 unspecified: Secondary | ICD-10-CM

## 2018-09-26 DIAGNOSIS — Z89511 Acquired absence of right leg below knee: Secondary | ICD-10-CM

## 2018-09-26 DIAGNOSIS — I5032 Chronic diastolic (congestive) heart failure: Secondary | ICD-10-CM

## 2018-09-26 NOTE — Progress Notes (Signed)
Office Visit Note   Patient: [name]           Date of Birth: 21-Feb-1934           MRN: [id number] Visit Date: 09/26/2018              Requested by: [name], [place],  [place] [address] PCP: [name], MD  Chief Complaint  Patient presents with  . Right Leg - Routine Post Op    03/28/2018 right BKA       HPI: The patient is a 85 yo gentleman who is seen for post operative follow up following a right transtibial amputation. He has a small residual wound over the lateral aspect of the amputation. They have been applying mupirocin ointment to the area. He was started on Doxycycline at last visit, but he was unable to tolerate this due to nausea and vomiting and stopped taking it. They are using a medical shrinker stocking.   Assessment & Plan: Visit Diagnoses:  1. S/P BKA (below knee amputation) unilateral, right ([place])   2. CKD (chronic kidney disease) stage 3, GFR 30-59 ml/min (HCC)   3. Chronic diastolic (congestive) heart failure (HCC)     Plan: The patient's wife was instructed to apply Silver collagen to wound beds every other day and cover with gauze and tape and then reapply the shrinker. Every other day, may wash with Dial soap and water and reapply more collagen as needed.  Follow up in about 10 days.   Follow-Up Instructions: Return in about 10 days (around 10/06/2018).   Ortho Exam  Patient is alert, oriented, no adenopathy, well-dressed, normal affect, normal respiratory effort. The lateral transtibial amputation wound has some minimal yellow slough which was debrided with gauze.  He has approximately 80% pink tissue and it does track posteriorly approximately 5 mm.  Overall wound size is 1 cm diameter.  There is no signs of cellulitis or infection over the area.  He has a small area along the incision line centrally as well which is approximately 1 cm in length and with no signs of cellulitis.  Both areas were packed with silver  collagen and the patient's wife was instructed in how to do this and will perform this every other day.  They will follow-up in 10 days.  Imaging: No results found. No images are attached to the encounter.  Labs: Lab Results  Component Value Date   HGBA1C 5.7 (H) 03/26/2018   HGBA1C 5.0 07/12/2017   HGBA1C 5.8 (H) 07/06/2015   ESRSEDRATE 24 (H) 03/24/2018   ESRSEDRATE 15 04/18/2017   ESRSEDRATE 5 09/17/2007   CRP 3.1 (H) 05/11/2018   CRP 1.8 (H) 03/24/2018   CRP <0.8 04/24/2017   LABURIC 6.0 03/09/2016   REPTSTATUS 05/14/2018 FINAL 05/09/2018   GRAMSTAIN  07/15/2017    WBC PRESENT,BOTH PMN AND MONONUCLEAR NO ORGANISMS SEEN Performed at [facility], [place] [address] [place], [place] [address]    CULT  05/09/2018    NO GROWTH 5 DAYS Performed at [facility] [address], [place], [place] [address]    LABORGA ESCHERICHIA COLI (A) 09/14/2017     Lab Results  Component Value Date   ALBUMIN 3.0 (L) 05/09/2018   ALBUMIN 2.5 (L) 03/31/2018   ALBUMIN 2.7 (L) 03/30/2018   PREALBUMIN 36 (H) 11/22/2017   PREALBUMIN 28.8 04/24/2017   LABURIC 6.0 03/09/2016    Lab Results  Component Value Date   MG  2.0 05/12/2018   MG 1.9 05/11/2018   MG 1.9 03/31/2018   No results found for: VD25OH  Lab Results  Component Value Date   PREALBUMIN 36 (H) 11/22/2017   PREALBUMIN 28.8 04/24/2017   CBC EXTENDED Latest Ref Rng & Units 05/12/2018 05/11/2018 05/10/2018  WBC 4.0 - 10.5 K/uL 6.2 6.0 6.4  RBC 4.22 - 5.81 MIL/uL 2.72(L) 2.73(L) 2.95(L)  HGB 13.0 - 17.0 g/dL 8.2(L) 8.3(L) 8.8(L)  HCT 39.0 - 52.0 % 26.4(L) 26.6(L) 28.7(L)  PLT 150 - 400 K/uL 186 195 210  NEUTROABS 1.7 - 7.7 K/uL 4.0 3.8 -  LYMPHSABS 0.7 - 4.0 K/uL 1.1 1.1 -     Body mass index is 22.89 kg/m.  Orders:  No orders of the defined types were placed in this encounter.  No orders of the defined types were placed in this encounter.    Procedures: No procedures performed  Clinical Data: No  additional findings.  ROS:  All other systems negative, except as noted in the HPI. Review of Systems  Objective: Vital Signs: Ht 5\' 9"  (1.753 m)   Wt 155 lb (70.3 kg)   BMI 22.89 kg/m   Specialty Comments:  No specialty comments available.  PMFS History: Patient Active Problem List   Diagnosis Date Noted  . Wound infection 05/09/2018  . AKI (acute kidney injury) ([place])   . S/P BKA (below knee amputation) unilateral, right ([place])   . Unilateral complete BKA, right, initial encounter ([place])   . Tachypnea   . Post-operative pain   . Subacute osteomyelitis, right ankle and foot ([place])   . Paralysis of right lower extremity ([place])   . TIA (transient ischemic attack) 03/24/2018  . Encephalopathy 03/24/2018  . Cellulitis 03/15/2018  . Hypernatremia 03/15/2018  . Persistent atrial fibrillation 11/10/2017  . Severe muscle deconditioning 11/10/2017  . Hemorrhage 10/22/2017  . CAD S/P percutaneous coronary angioplasty 10/22/2017  . Acute hypoxemic respiratory failure ([place]) 07/17/2017  . Aspiration syndrome, subsequent encounter 07/11/2017  . Aspiration pneumonia ([place]) 07/01/2017  . Acute encephalopathy 07/01/2017  . Thrombocytopenia ([place]) 07/01/2017  . Chronic diastolic (congestive) heart failure ([place]) 07/01/2017  . Anemia of chronic disease 07/01/2017  . Troponin level elevated 07/01/2017  . Chronic atrial fibrillation 07/01/2017  . Goals of care, counseling/discussion   . Palliative care by specialist   . Acute metabolic encephalopathy 03/04/8249  . CKD (chronic kidney disease) stage 3, GFR 30-59 ml/min (HCC)   . CAP (community acquired pneumonia) 04/17/2017  . Hallux rigidus, right foot 03/10/2016  . Lumbosacral spondylosis without myelopathy 10/29/2015  . Memory difficulty 09/22/2015  . Abnormality of gait 09/22/2015  . Hyperglycemia 07/06/2015  . Chronic pain 07/06/2015  . Chronic insomnia 03/30/2015  . Transient alteration of awareness 03/30/2015  . Abnormal liver  function   . Altered mental status 01/31/2015  . Essential hypertension 01/31/2015  . Constipation 01/31/2015  . Hypothyroidism 01/31/2015  . Seizure disorder ([place]) 01/31/2015  . Bladder outlet obstruction 01/31/2015  . GERD (gastroesophageal reflux disease) 01/31/2015  . Chronic back pain 01/31/2015  . Acute kidney injury ([facility]) 01/31/2015   Past Medical History:  Diagnosis Date  . Abnormality of gait 09/22/2015  . Arthritis   . Atrial fibrillation ([place])   . CAD (coronary artery disease)    Stent to RCA, Penta stent, 99% reduced to 0% 2002.  . Cancer ([place])    skin CA removed from back  . Chronic insomnia 03/30/2015  . Complication of anesthesia    trouble waking up  .  GERD (gastroesophageal reflux disease)   . Hypercholesteremia   . Hypertension   . Hypothyroidism   . Memory difficulty 09/22/2015  . Osteoarthritis   . Seizures ([place])   . Sepsis ([place]) 05/2017  . Transient alteration of awareness 03/30/2015  . Vertigo    hx of    Family History  Problem Relation Age of Onset  . Hypertension Mother   . Cancer Mother   . Kidney failure Father   . Heart disease Father     Past Surgical History:  Procedure Laterality Date  . AMPUTATION Right 03/28/2018   Procedure: AMPUTATION BELOW KNEE;  Surgeon: Newt Minion, MD;  Location: [facility];  Service: Orthopedics;  Laterality: Right;  . BACK SURGERY    . EYE SURGERY     Bilateral Cataract surgery   . HERNIA REPAIR    . I&D EXTREMITY Right 05/10/2015   Procedure: IRRIGATION AND DEBRIDEMENT EXTREMITY;  Surgeon: Leanora Cover, MD;  Location: [facility];  Service: Orthopedics;  Laterality: Right;  . KNEE ARTHROPLASTY     right knee X 2; left knee once  . LAMINECTOMY     X 6  . POSTERIOR CERVICAL FUSION/FORAMINOTOMY  01/28/2012   Procedure: POSTERIOR CERVICAL FUSION/FORAMINOTOMY LEVEL 3;  Surgeon: [name], MD;  Location: [facility];  Service: Neurosurgery;  Laterality: Left;  Posterior Cervical Five-Thoracic  One Fusion, Arthrodesis with LEFT Cervical Seven-thoracic One Laminectomy, Foraminotomy and Resection of Synovial Cyst  . POSTERIOR CERVICAL FUSION/FORAMINOTOMY N/A 01/29/2013   Procedure: POSTERIOR CERVICAL FUSION/FORAMINOTOMY LEVEL 1 and C2-5 Posteriolateral Arthrodesis;  Surgeon: [name], MD;  Location: [facility];  Service: Neurosurgery;  Laterality: N/A;  C2-C3 Laminectomy C2-C3 posterior cervical arthrodesis  . TONSILLECTOMY     Social History   Occupational History  . Occupation: retired Software engineer  Tobacco Use  . Smoking status: Former Research scientist (life sciences)  . Smokeless tobacco: Never Used  Substance and Sexual Activity  . Alcohol use: Not Currently    Comment: rare  . Drug use: No  . Sexual activity: Not Currently

## 2018-10-02 ENCOUNTER — Ambulatory Visit: Payer: Medicare Other | Admitting: Physician Assistant

## 2018-10-06 ENCOUNTER — Telehealth: Payer: Self-pay | Admitting: Physician Assistant

## 2018-10-06 ENCOUNTER — Ambulatory Visit: Payer: Medicare Other | Admitting: Physician Assistant

## 2018-10-06 NOTE — Telephone Encounter (Signed)
I called and sw pt's wife. She advised that she was concerned about the tissue from the [place] and did not want to "aggressivley clean the area if new tissue was forming" I advised that we can make an appt for the pt to come in tomorrow ( pt cancelled appt today) for eval because the pt's wife is very concerned about the instructions on keeping the area clean. Voiced understanding and will come in tomorrow.

## 2018-10-06 NOTE — Telephone Encounter (Signed)
Patient wife [name]) called asked for a call back from [facility] as soon as possible concerning wound dressing today prior to patients 2:15 appointment. The number to contact [name] is [phone]

## 2018-10-07 ENCOUNTER — Encounter: Payer: Self-pay | Admitting: Orthopedic Surgery

## 2018-10-07 ENCOUNTER — Ambulatory Visit (INDEPENDENT_AMBULATORY_CARE_PROVIDER_SITE_OTHER): Payer: Medicare Other | Admitting: Orthopedic Surgery

## 2018-10-07 ENCOUNTER — Other Ambulatory Visit: Payer: Self-pay

## 2018-10-07 VITALS — Ht 69.0 in | Wt 155.0 lb

## 2018-10-07 DIAGNOSIS — Z89511 Acquired absence of right leg below knee: Secondary | ICD-10-CM | POA: Diagnosis not present

## 2018-10-07 DIAGNOSIS — T8781 Dehiscence of amputation stump: Secondary | ICD-10-CM | POA: Diagnosis not present

## 2018-10-07 DIAGNOSIS — Z9861 Coronary angioplasty status: Secondary | ICD-10-CM | POA: Diagnosis not present

## 2018-10-07 DIAGNOSIS — I2510 Atherosclerotic heart disease of native coronary artery without angina pectoris: Secondary | ICD-10-CM

## 2018-10-07 MED ORDER — SILVER SULFADIAZINE 1 % EX CREA: 1.0000 "application " | TOPICAL_CREAM | Freq: Every day | CUTANEOUS | 3 refills | Status: DC

## 2018-10-07 NOTE — Progress Notes (Signed)
Office Visit Note   Patient: [name]           Date of Birth: 04-Sep-1933           MRN: [id number] Visit Date: 10/07/2018              Requested by: [name], [place],  [place] [address] PCP: [name], MD  Chief Complaint  Patient presents with  . Right Leg - Follow-up    03/28/18 right BKA       HPI: Patient is an 85 year old gentleman who is 6 months status post right transtibial amputation he has undergone multiple wound debridement options including surgical debridement in the office using silver collagen dressing ointments including Silvadene with persistent ulceration laterally and medially.  Assessment & Plan: Visit Diagnoses:  1. S/P BKA (below knee amputation) unilateral, right ([place])   2. Dehiscence of amputation stump ([place])     Plan: Discussed with the patient and his wife with prolonged conservative therapy for 6 months with failure of healing the area of wound dehiscence of recommend surgical excision of these areas with wound closure and placement of a wound VAC as an outpatient surgery.  All questions and concerns were encouraged and answered.  Patient states that he is not ready to make this decision at this time we will discontinue the silver alginate use Silvadene and follow-up in the office in 3 weeks they will call if they want to set this up as outpatient surgery at [facility].  Discussed that he would need to be COVID-19 tested prior to surgery.  Follow-Up Instructions: Return in about 3 weeks (around 10/28/2018).   Ortho Exam  Patient is alert, oriented, no adenopathy, well-dressed, normal affect, normal respiratory effort. Examination patient has fibrinous exudative tissue on both wounds after debridement the midline wound has 90% granulation tissue this is 10 mm in diameter 1 mm deep with no exposed bone or tendon no cellulitis.  The lateral wound is 1 cm diameter and extends down to an area of fat undermines about 2 cm in  diameter.  There is fibrinous exudative tissue on the residual soft tissue there is no odor no drainage no cellulitis no signs of abscess or necrotic tissue.  Imaging: No results found. No images are attached to the encounter.  Labs: Lab Results  Component Value Date   HGBA1C 5.7 (H) 03/26/2018   HGBA1C 5.0 07/12/2017   HGBA1C 5.8 (H) 07/06/2015   ESRSEDRATE 24 (H) 03/24/2018   ESRSEDRATE 15 04/18/2017   ESRSEDRATE 5 09/17/2007   CRP 3.1 (H) 05/11/2018   CRP 1.8 (H) 03/24/2018   CRP <0.8 04/24/2017   LABURIC 6.0 03/09/2016   REPTSTATUS 05/14/2018 FINAL 05/09/2018   GRAMSTAIN  07/15/2017    WBC PRESENT,BOTH PMN AND MONONUCLEAR NO ORGANISMS SEEN Performed at [facility], [place] [address], [place], [place] [address]    CULT  05/09/2018    NO GROWTH 5 DAYS Performed at [facility] [address] [place], [place] [address]    LABORGA ESCHERICHIA COLI (A) 09/14/2017     Lab Results  Component Value Date   ALBUMIN 3.0 (L) 05/09/2018   ALBUMIN 2.5 (L) 03/31/2018   ALBUMIN 2.7 (L) 03/30/2018   PREALBUMIN 36 (H) 11/22/2017   PREALBUMIN 28.8 04/24/2017   LABURIC 6.0 03/09/2016    Lab Results  Component Value Date   MG 2.0 05/12/2018   MG 1.9 05/11/2018   MG 1.9 03/31/2018   No  results found for: [facility]  Lab Results  Component Value Date   PREALBUMIN 36 (H) 11/22/2017   PREALBUMIN 28.8 04/24/2017   CBC EXTENDED Latest Ref Rng & Units 05/12/2018 05/11/2018 05/10/2018  WBC 4.0 - 10.5 K/uL 6.2 6.0 6.4  RBC 4.22 - 5.81 MIL/uL 2.72(L) 2.73(L) 2.95(L)  HGB 13.0 - 17.0 g/dL 8.2(L) 8.3(L) 8.8(L)  HCT 39.0 - 52.0 % 26.4(L) 26.6(L) 28.7(L)  PLT 150 - 400 K/uL 186 195 210  NEUTROABS 1.7 - 7.7 K/uL 4.0 3.8 -  LYMPHSABS 0.7 - 4.0 K/uL 1.1 1.1 -     Body mass index is 22.89 kg/m.  Orders:  No orders of the defined types were placed in this encounter.  No orders of the defined types were placed in this encounter.    Procedures: No procedures performed   Clinical Data: No additional findings.  ROS:  All other systems negative, except as noted in the HPI. Review of Systems  Objective: Vital Signs: Ht 5\' 9"  (1.753 m)   Wt 155 lb (70.3 kg)   BMI 22.89 kg/m   Specialty Comments:  No specialty comments available.  PMFS History: Patient Active Problem List   Diagnosis Date Noted  . Wound infection 05/09/2018  . AKI (acute kidney injury) ([place])   . S/P BKA (below knee amputation) unilateral, right ([place])   . Unilateral complete BKA, right, initial encounter ([place])   . Tachypnea   . Post-operative pain   . Subacute osteomyelitis, right ankle and foot ([place])   . Paralysis of right lower extremity ([place])   . TIA (transient ischemic attack) 03/24/2018  . Encephalopathy 03/24/2018  . Cellulitis 03/15/2018  . Hypernatremia 03/15/2018  . Persistent atrial fibrillation 11/10/2017  . Severe muscle deconditioning 11/10/2017  . Hemorrhage 10/22/2017  . CAD S/P percutaneous coronary angioplasty 10/22/2017  . Acute hypoxemic respiratory failure ([place]) 07/17/2017  . Aspiration syndrome, subsequent encounter 07/11/2017  . Aspiration pneumonia ([place]) 07/01/2017  . Acute encephalopathy 07/01/2017  . Thrombocytopenia ([place]) 07/01/2017  . Chronic diastolic (congestive) heart failure ([place]) 07/01/2017  . Anemia of chronic disease 07/01/2017  . Troponin level elevated 07/01/2017  . Chronic atrial fibrillation 07/01/2017  . Goals of care, counseling/discussion   . Palliative care by specialist   . Acute metabolic encephalopathy 44/03/270  . CKD (chronic kidney disease) stage 3, GFR 30-59 ml/min (HCC)   . CAP (community acquired pneumonia) 04/17/2017  . Hallux rigidus, right foot 03/10/2016  . Lumbosacral spondylosis without myelopathy 10/29/2015  . Memory difficulty 09/22/2015  . Abnormality of gait 09/22/2015  . Hyperglycemia 07/06/2015  . Chronic pain 07/06/2015  . Chronic insomnia 03/30/2015  . Transient alteration of awareness 03/30/2015  .  Abnormal liver function   . Altered mental status 01/31/2015  . Essential hypertension 01/31/2015  . Constipation 01/31/2015  . Hypothyroidism 01/31/2015  . Seizure disorder ([place]) 01/31/2015  . Bladder outlet obstruction 01/31/2015  . GERD (gastroesophageal reflux disease) 01/31/2015  . Chronic back pain 01/31/2015  . Acute kidney injury ([place]) 01/31/2015   Past Medical History:  Diagnosis Date  . Abnormality of gait 09/22/2015  . Arthritis   . Atrial fibrillation ([place])   . CAD (coronary artery disease)    Stent to RCA, Penta stent, 99% reduced to 0% 2002.  . Cancer ([place])    skin CA removed from back  . Chronic insomnia 03/30/2015  . Complication of anesthesia    trouble waking up  . GERD (gastroesophageal reflux disease)   . Hypercholesteremia   . Hypertension   .  Hypothyroidism   . Memory difficulty 09/22/2015  . Osteoarthritis   . Seizures ([place])   . Sepsis ([place]) 05/2017  . Transient alteration of awareness 03/30/2015  . Vertigo    hx of    Family History  Problem Relation Age of Onset  . Hypertension Mother   . Cancer Mother   . Kidney failure Father   . Heart disease Father     Past Surgical History:  Procedure Laterality Date  . AMPUTATION Right 03/28/2018   Procedure: AMPUTATION BELOW KNEE;  Surgeon: Newt Minion, MD;  Location: [facility];  Service: Orthopedics;  Laterality: Right;  . BACK SURGERY    . EYE SURGERY     Bilateral Cataract surgery   . HERNIA REPAIR    . I&D EXTREMITY Right 05/10/2015   Procedure: IRRIGATION AND DEBRIDEMENT EXTREMITY;  Surgeon: Leanora Cover, MD;  Location: [facility];  Service: Orthopedics;  Laterality: Right;  . KNEE ARTHROPLASTY     right knee X 2; left knee once  . LAMINECTOMY     X 6  . POSTERIOR CERVICAL FUSION/FORAMINOTOMY  01/28/2012   Procedure: POSTERIOR CERVICAL FUSION/FORAMINOTOMY LEVEL 3;  Surgeon: [name], MD;  Location: [facility];  Service: Neurosurgery;  Laterality: Left;  Posterior Cervical  Five-Thoracic One Fusion, Arthrodesis with LEFT Cervical Seven-thoracic One Laminectomy, Foraminotomy and Resection of Synovial Cyst  . POSTERIOR CERVICAL FUSION/FORAMINOTOMY N/A 01/29/2013   Procedure: POSTERIOR CERVICAL FUSION/FORAMINOTOMY LEVEL 1 and C2-5 Posteriolateral Arthrodesis;  Surgeon: [name], MD;  Location: [facility];  Service: Neurosurgery;  Laterality: N/A;  C2-C3 Laminectomy C2-C3 posterior cervical arthrodesis  . TONSILLECTOMY     Social History   Occupational History  . Occupation: retired Software engineer  Tobacco Use  . Smoking status: Former Research scientist (life sciences)  . Smokeless tobacco: Never Used  Substance and Sexual Activity  . Alcohol use: Not Currently    Comment: rare  . Drug use: No  . Sexual activity: Not Currently

## 2018-10-09 ENCOUNTER — Ambulatory Visit: Payer: Medicare Other | Admitting: Orthopedic Surgery

## 2018-10-13 ENCOUNTER — Telehealth: Payer: Self-pay | Admitting: Orthopedic Surgery

## 2018-10-13 NOTE — Telephone Encounter (Signed)
I called and sw the pt's wife and she was concerned because the rx says apply topically but states that Dr. [name] advised to apply to wound and then cover with dressing. I advised that this was the correct instruction she is applying topically by apply to bandage and then putting on wound. Pt's wife also states that she is afraid to remove a dressing that erin applied to a skin tear when he was in the office staitng that it is very sticky. Advised that she can gently work the dressing office with cleanser and water or he can come in and we can remove it for her. States that she will call if needed.

## 2018-10-13 NOTE — Telephone Encounter (Signed)
Pt's wife [name] called stating she was given instructions on how to apply the Silver sulfadiazine by Dr [name], but when she got the medication the instruction on how to apply was different. [name] needs clarification on how to apply.    Also she states a sticky pad was placed onto a tear/wound that she can not get to come off without tear the skin. She request a call asap.

## 2018-10-14 LAB — BASIC METABOLIC PANEL
BUN/Creatinine Ratio: 43 — ABNORMAL HIGH (ref 10–24)
BUN: 49 mg/dL — ABNORMAL HIGH (ref 8–27)
CO2: 22 mmol/L (ref 20–29)
Calcium: 8.3 mg/dL — ABNORMAL LOW (ref 8.6–10.2)
Chloride: 104 mmol/L (ref 96–106)
Creatinine, Ser: 1.14 mg/dL (ref 0.76–1.27)
GFR calc Af Amer: 69 mL/min/{1.73_m2} (ref >59)
GFR calc non Af Amer: 60 mL/min/{1.73_m2} (ref >59)
Glucose: 128 mg/dL — ABNORMAL HIGH (ref 65–99)
Potassium: 4.9 mmol/L (ref 3.5–5.2)
Sodium: 142 mmol/L (ref 134–144)

## 2018-10-21 ENCOUNTER — Other Ambulatory Visit: Payer: Self-pay

## 2018-10-21 ENCOUNTER — Encounter: Payer: Self-pay | Admitting: Cardiovascular Disease

## 2018-10-21 ENCOUNTER — Ambulatory Visit (INDEPENDENT_AMBULATORY_CARE_PROVIDER_SITE_OTHER): Payer: Medicare Other | Admitting: Cardiovascular Disease

## 2018-10-21 VITALS — BP 121/69 | HR 82 | Ht 69.0 in | Wt 154.0 lb

## 2018-10-21 DIAGNOSIS — I4821 Permanent atrial fibrillation: Secondary | ICD-10-CM

## 2018-10-21 DIAGNOSIS — Z9861 Coronary angioplasty status: Secondary | ICD-10-CM

## 2018-10-21 DIAGNOSIS — E7800 Pure hypercholesterolemia, unspecified: Secondary | ICD-10-CM

## 2018-10-21 DIAGNOSIS — N183 Chronic kidney disease, stage 3 unspecified: Secondary | ICD-10-CM

## 2018-10-21 DIAGNOSIS — I5032 Chronic diastolic (congestive) heart failure: Secondary | ICD-10-CM

## 2018-10-21 DIAGNOSIS — Z7952 Long term (current) use of systemic steroids: Secondary | ICD-10-CM

## 2018-10-21 DIAGNOSIS — I2510 Atherosclerotic heart disease of native coronary artery without angina pectoris: Secondary | ICD-10-CM

## 2018-10-21 DIAGNOSIS — S88111A Complete traumatic amputation at level between knee and ankle, right lower leg, initial encounter: Secondary | ICD-10-CM

## 2018-10-21 DIAGNOSIS — D649 Anemia, unspecified: Secondary | ICD-10-CM

## 2018-10-21 NOTE — Patient Instructions (Addendum)
Medication Instructions:  STOP the Ezetimibe (Zetia)  If you need a refill on your cardiac medications before your next appointment, please call your pharmacy.   Lab work: None ordered If you have labs (blood work) drawn today and your tests are completely normal, you will receive your results only by: Marland Kitchen MyChart Message (if you have MyChart) OR . A paper copy in the mail If you have any lab test that is abnormal or we need to change your treatment, we will call you to review the results.  Testing/Procedures: None ordered  Follow-Up: At [facility] you and your health needs are our priority.  As part of our continuing mission to provide you with exceptional heart care, we have created designated Provider Care Teams.  These Care Teams include your primary Cardiologist (physician) and Advanced Practice Providers (APPs -  Physician Assistants and Nurse Practitioners) who all work together to provide you with the care you need, when you need it. You will need a follow up appointment in 12 months.  Please call our office 2 months in advance to schedule this appointment.  You may see Sanda Klein, MD or one of the following Advanced Practice Providers on your designated Care Team: [place], [place] . Fabian Sharp, PA-C

## 2018-10-21 NOTE — Progress Notes (Signed)
Cardiology Office Note:    Date:  10/23/2018   ID:  [name], DOB Jul 27, 1933, MRN [id number]  PCP:  [name], MD  Cardiologist:  Sanda Klein, MD   Referring MD: [name], MD   Chief Complaint  Patient presents with  . Congestive Heart Failure    History of Present Illness:    [name] is a 85 y.o. 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  Heart failure has been easier to manage recently and he has not had problems with leg edema, orthopnea or PND.  It is also been a long time since he has had issues with orthostatic hypotension and syncope or renal insufficiency.  He seems to be euvolemic.  Since he is unable to stand for daily weights due to his amputation, we are using the circumference of his left calf to adjust the diuretic dosing. Left calf circumference equivalent for "dry weight" is a calf circumference of 14 inches.  Mrs. Arras always measures in the same location that is well marked by an old surgical scar.  Normal left ventricular systolic function by his last echo performed in February 2019.  He has 2 nonhealing wounds on the right leg stump and Dr. [name] has recommended a stump revision.  He has been very reluctant to do this, but since wound care is not helping he is reconsidering.  He denies palpitations or syncope.  Overall feels that he is doing well.  He has lost some real weight.  Appears euvolemic at today's weight of 154 pounds.  Past Medical History:  Diagnosis Date  . Abnormality of gait 09/22/2015  . Arthritis   . Atrial fibrillation ([place])   . CAD  (coronary artery disease)    Stent to RCA, Penta stent, 99% reduced to 0% 2002.  . Cancer ([place])    skin CA removed from back  . Chronic insomnia 03/30/2015  . Complication of anesthesia    trouble waking up  . GERD (gastroesophageal reflux disease)   . Hypercholesteremia   . Hypertension   . Hypothyroidism   . Memory difficulty 09/22/2015  . Osteoarthritis   . Seizures ([place])   . Sepsis ([place]) 05/2017  . Transient alteration of awareness 03/30/2015  . Vertigo    hx of    Past Surgical History:  Procedure Laterality Date  . AMPUTATION Right 03/28/2018   Procedure: AMPUTATION BELOW KNEE;  Surgeon: Newt Minion, MD;  Location: [facility];  Service: Orthopedics;  Laterality: Right;  . BACK SURGERY    . EYE SURGERY     Bilateral Cataract surgery   . HERNIA REPAIR    . I&D EXTREMITY Right 05/10/2015   Procedure: IRRIGATION AND DEBRIDEMENT EXTREMITY;  Surgeon: Leanora Cover, MD;  Location: [facility];  Service: Orthopedics;  Laterality: Right;  . KNEE ARTHROPLASTY     right knee X 2; left knee once  . LAMINECTOMY     X 6  . POSTERIOR CERVICAL FUSION/FORAMINOTOMY  01/28/2012   Procedure: POSTERIOR CERVICAL FUSION/FORAMINOTOMY LEVEL 3;  Surgeon: [name], MD;  Location: [facility];  Service: Neurosurgery;  Laterality: Left;  Posterior Cervical Five-Thoracic One Fusion, Arthrodesis with LEFT Cervical Seven-thoracic  One Laminectomy, Foraminotomy and Resection of Synovial Cyst  . POSTERIOR CERVICAL FUSION/FORAMINOTOMY N/A 01/29/2013   Procedure: POSTERIOR CERVICAL FUSION/FORAMINOTOMY LEVEL 1 and C2-5 Posteriolateral Arthrodesis;  Surgeon: [name], MD;  Location: [facility];  Service: Neurosurgery;  Laterality: N/A;  C2-C3 Laminectomy C2-C3 posterior cervical arthrodesis  . TONSILLECTOMY      Current Medications: Current Meds  Medication Sig  . acetaminophen (TYLENOL) 650 MG CR tablet Take 650 mg by mouth every 8 (eight) hours as needed for pain.  Marland Kitchen  alendronate (FOSAMAX) 70 MG tablet Take 70 mg by mouth every Wednesday. Remain upright for 30-60 minutes.  . Amino Acids-Protein Hydrolys (FEEDING SUPPLEMENT, PRO-STAT SUGAR FREE 64,) LIQD Take 30 mLs by mouth 3 (three) times daily with meals.  Marland Kitchen aspirin EC 81 MG tablet Take 81 mg by mouth daily.  . calcium carbonate (OSCAL) 1500 (600 Ca) MG TABS tablet Take 600 mg of elemental calcium by mouth daily after breakfast.  . Cholecalciferol (VITAMIN D) 125 MCG (5000 UT) CAPS Take 1 capsule by mouth daily.  . cyanocobalamin 500 MCG tablet Take 500 mcg by mouth at bedtime. Vitamin B12  . diclofenac sodium (VOLTAREN) 1 % GEL Apply 2 g topically daily as needed (pain).  Marland Kitchen diltiazem (CARDIZEM CD) 240 MG 24 hr capsule Take 240 mg every morning   Hold if systolic blood pressure is less than 100.  . divalproex (DEPAKOTE) 250 MG DR tablet Take 250-500 mg by mouth See admin instructions. Take one tablet (250 mg) by mouth every morning and two tablets (500 mg) every evening (for epilepsy)  . doxazosin (CARDURA) 4 MG tablet Take 4 mg by mouth at bedtime.   . furosemide (LASIX) 20 MG tablet Take 1 tablet (20 mg total) by mouth daily.  Marland Kitchen HYDROcodone-acetaminophen (NORCO) 10-325 MG tablet Take 1 tablet by mouth every 4 (four) hours as needed for moderate pain.  Marland Kitchen levothyroxine (SYNTHROID, LEVOTHROID) 150 MCG tablet Take 150 mcg by mouth daily before breakfast.  . Lidocaine (ASPERCREME LIDOCAINE) 4 % PTCH Apply 1 patch topically 2 (two) times daily as needed.   . metoprolol tartrate (LOPRESSOR) 25 MG tablet Take 1 tablet (25mg ) by mouth twice daily. Hold med for SBP<90 & DBP<50 Per Dr. Sallyanne Kuster  . Multiple Vitamins-Minerals (CERTAVITE/ANTIOXIDANTS) TABS Take 1 tablet by mouth every evening. With Iron 18 mg/ folic acid 716 mcg  . mupirocin ointment (BACTROBAN) 2 % Apply 1 application topically daily. Apply to right lateral leg wound daily  . nitroGLYCERIN (NITROSTAT) 0.4 MG SL tablet Place 1 tablet (0.4 mg total) under  the tongue every 5 (five) minutes as needed for chest pain (call 911 afer 3 doses and if chest pain persists).  Marland Kitchen omeprazole (PRILOSEC) 20 MG capsule Take 20 mg by mouth daily before breakfast.   . potassium chloride SA (K-DUR) 20 MEQ tablet Take 1/2 tablet ( 10 mEq ) by mouth daily  . pravastatin (PRAVACHOL) 40 MG tablet Take 40 mg by mouth every evening.   . predniSONE (DELTASONE) 5 MG tablet Take 2 tablets (10 mg total) by mouth daily with breakfast.  . silver sulfADIAZINE (SILVADENE) 1 % cream Apply 1 application topically daily. Apply to affected area daily plus dry dressing  . [DISCONTINUED] doxycycline (VIBRAMYCIN) 100 MG capsule Take 1 capsule (100 mg total) by mouth 2 (two) times daily.  . [DISCONTINUED] ezetimibe (ZETIA) 10 MG tablet Take 5 mg by mouth at bedtime.     Allergies:   Eliquis [apixaban], Demerol [meperidine], Keppra [levetiracetam], and Other  Social History   Socioeconomic History  . Marital status: Married    Spouse name: [name]  . Number of children: 3  . Years of education: 29  . Highest education level: Not on file  Occupational History  . Occupation: retired Software engineer  Social Needs  . Financial resource strain: Not hard at all  . Food insecurity    Worry: Never true    Inability: Never true  . Transportation needs    Medical: No    Non-medical: No  Tobacco Use  . Smoking status: Former Research scientist (life sciences)  . Smokeless tobacco: Never Used  Substance and Sexual Activity  . Alcohol use: Not Currently    Comment: rare  . Drug use: No  . Sexual activity: Not Currently  Lifestyle  . Physical activity    Days per week: Not on file    Minutes per session: Not on file  . Stress: Only a little  Relationships  . Social Herbalist on phone: Once a week    Gets together: Not on file    Attends religious service: Not on file    Active member of club or organization: No    Attends meetings of clubs or organizations: Never    Relationship status:  Married  Other Topics Concern  . Not on file  Social History Narrative   Lives at home w/ his wife [name]   Patient drinks 4-5 cups of coffee daily.   Patient is right handed.      Family History: The patient's family history includes Cancer in his mother; Heart disease in his father; Hypertension in his mother; Kidney failure in his father.  ROS:   Please see the history of present illness.    All other systems reviewed and are negative.  EKGs/Labs/Other Studies Reviewed:     EKG:  EKG is not ordered today.   Recent Labs: 04/23/2018: NT-Pro BNP 3,612 05/09/2018: ALT 43 05/12/2018: Hemoglobin 8.2; Magnesium 2.0; Platelets 186 10/13/2018: BUN 49; Creatinine, Ser 1.14; Potassium 4.9; Sodium 142  Recent Lipid Panel    Component Value Date/Time   CHOL 88 03/26/2018 0445   TRIG 75 03/26/2018 0445   HDL 38 (L) 03/26/2018 0445   CHOLHDL 2.3 03/26/2018 0445   VLDL 15 03/26/2018 0445   LDLCALC 35 03/26/2018 0445    Physical Exam:    VS:  BP 121/69   Pulse 82   Ht 5\' 9"  (1.753 m)   Wt 154 lb (69.9 kg)   SpO2 96%   BMI 22.74 kg/m     Wt Readings from Last 3 Encounters:  10/21/18 154 lb (69.9 kg)  10/07/18 155 lb (70.3 kg)  09/26/18 155 lb (70.3 kg)      General: Alert, oriented x3, no distress, appears comfortable and is smiling Head: no evidence of trauma, PERRL, EOMI, no exophtalmos or lid lag, no myxedema, no xanthelasma; normal ears, nose and oropharynx Neck: normal jugular venous pulsations and no hepatojugular reflux; brisk carotid pulses without delay and no carotid bruits Chest: clear to auscultation, no signs of consolidation by percussion or palpation, normal fremitus, symmetrical and full respiratory excursions Cardiovascular: normal position and quality of the apical impulse, regular rhythm, normal first and second heart sounds, no murmurs, rubs or gallops Abdomen: no tenderness or distention, no masses by palpation, no abnormal pulsatility or arterial bruits,  normal bowel sounds, no hepatosplenomegaly Extremities: Right lower extremity below the knee amputation with some scanty drainage from the amputation stump; no clubbing, cyanosis or  edema; 2+ radial, ulnar and brachial pulses bilaterally; 2+ right femoral, posterior tibial and dorsalis pedis pulses; 2+ left femoral, posterior tibial and dorsalis pedis pulses; no subclavian or femoral bruits He has extensive bruising over his forearms and fragile skin Neurological: grossly nonfocal Psych: Normal mood and affect   ASSESSMENT:    1. Chronic diastolic heart failure ([place])   2. Permanent atrial fibrillation   3. Coronary artery disease involving native coronary artery of native heart without angina pectoris   4. Hypercholesterolemia   5. CKD (chronic kidney disease) stage 3, GFR 30-59 ml/min (HCC)   6. Current chronic use of systemic steroids   7. Below-knee amputation of right lower extremity ([place])   8. Chronic anemia    PLAN:    In order of problems listed above:  1. CHF: He has lost substantial weight.  We will reset his "dry weight" at about 154 pounds, but we are not able to monitor this regularly and are using the left calf circumference of 14 inches as a marker for dry weight.  In the past it was very difficult to maintain the balance between renal failure/orthostatic hypotension and congestive heart failure, but this has been recently more stable.  I think we have achieved a reasonable compromise.   2. AFib: Persistent arrhythmia, likely permanent.  Good rate control.  Unable to anticoagulate due to severe recurrent bleeding complications, most recently a spontaneous hemorrhage in his left leg. 3. CAD: Asymptomatic.  He has not required percutaneous revascularization since 2002.  He has normal left ventricular systolic function. 4. HLP: Very low LDL.  Continue statin but stopped Zetia. 5. CKD 3: Creatinine is recently much better than previous baseline estimation of 1.3.  Per most recent  labs his GFR is around 60. 6. Deconditioning: Remains a serious problem, continue working with physical therapy at home. 7. Chronic steroid therapy: He takes this for his "arthritis".  He has not been able to wean below this dose in many years.  This makes him very prone to sodium retention and hypervolemia.  He also needs to be aware that he will require "stress doses of hydrocortisone" for any serious illnesses or major surgeries. 8. L BKA: The wound is not healing.  Considering stump revision. 9. Chronic anemia: Hemoglobin has been around 8-9 throughout earlier this year.  Not rechecked on most recent labs.   Medication Adjustments/Labs and Tests Ordered: Current medicines are reviewed at length with the patient today.  Concerns regarding medicines are outlined above.  No orders of the defined types were placed in this encounter.  No orders of the defined types were placed in this encounter.   Patient Instructions  Medication Instructions:  STOP the Ezetimibe (Zetia)  If you need a refill on your cardiac medications before your next appointment, please call your pharmacy.   Lab work: None ordered If you have labs (blood work) drawn today and your tests are completely normal, you will receive your results only by: Marland Kitchen MyChart Message (if you have MyChart) OR . A paper copy in the mail If you have any lab test that is abnormal or we need to change your treatment, we will call you to review the results.  Testing/Procedures: None ordered  Follow-Up: At [facility], you and your health needs are our priority.  As part of our continuing mission to provide you with exceptional heart care, we have created designated Provider Care Teams.  These Care Teams include your primary Cardiologist (physician) and Advanced Practice Providers (  APPs -  Physician Assistants and Nurse Practitioners) who all work together to provide you with the care you need, when you need it. You will need a follow up  appointment in 12 months.  Please call our office 2 months in advance to schedule this appointment.  You may see Sanda Klein, MD or one of the following Advanced Practice Providers on your designated Care Team: [place], [place] . Fabian Sharp, PA-C      Signed, Sanda Klein, MD  10/23/2018 5:58 PM    [place]

## 2018-10-23 ENCOUNTER — Encounter: Payer: Self-pay | Admitting: Cardiovascular Disease

## 2018-10-27 ENCOUNTER — Encounter: Payer: Self-pay | Admitting: Orthopedic Surgery

## 2018-10-27 ENCOUNTER — Ambulatory Visit (INDEPENDENT_AMBULATORY_CARE_PROVIDER_SITE_OTHER): Payer: Medicare Other | Admitting: Orthopedic Surgery

## 2018-10-27 VITALS — Ht 69.0 in | Wt 154.0 lb

## 2018-10-27 DIAGNOSIS — Z9861 Coronary angioplasty status: Secondary | ICD-10-CM | POA: Diagnosis not present

## 2018-10-27 DIAGNOSIS — Z89511 Acquired absence of right leg below knee: Secondary | ICD-10-CM | POA: Diagnosis not present

## 2018-10-27 DIAGNOSIS — T8781 Dehiscence of amputation stump: Secondary | ICD-10-CM

## 2018-10-27 DIAGNOSIS — I2510 Atherosclerotic heart disease of native coronary artery without angina pectoris: Secondary | ICD-10-CM

## 2018-10-28 ENCOUNTER — Ambulatory Visit: Payer: Medicare Other | Admitting: Orthopedic Surgery

## 2018-10-28 ENCOUNTER — Encounter: Payer: Self-pay | Admitting: Orthopedic Surgery

## 2018-10-28 NOTE — Progress Notes (Signed)
Office Visit Note   Patient: [name]           Date of Birth: 1933/03/17           MRN: [id number] Visit Date: 10/27/2018              Requested by: [name], [address],  [place] [address] PCP: [name], MD  Chief Complaint  Patient presents with  . Left Foot - Follow-up, Pain  . Right Leg - Follow-up, Pain  . Right Elbow - Pain, Follow-up      HPI: Patient is a 85 year old gentleman who presents in follow-up for right transtibial amputation patient has had wound dehiscence he was scheduled an appointment with vascular vein surgery for a second opinion regarding his vascular status however patient's wife states that the appointment was too far out and they did not schedule an appointment.  Patient is wife states that she is concerned about his right elbow he is using his elbow to move himself around also concerned about his left lower extremity with swelling in the persistent ulcerations to the right lower extremity.  Assessment & Plan: Visit Diagnoses:  1. S/P BKA (below knee amputation) unilateral, right ([place])   2. Dehiscence of amputation stump (HCC)     Plan: Continue wound care for the right transtibial amputation discussed that the lateral ulcer is ischemic and we can continue Silvadene dressing changes.  Recommended patient get some elbow pads to protect the elbows and recommended continuing wearing the compression socks for the left lower extremity.  Patient could also use some Allevyn pads around the ankle to help relieve pressure points on the left lower extremity  Follow-Up Instructions: Return in about 2 weeks (around 11/10/2018).   Ortho Exam  Patient is alert, oriented, no adenopathy, well-dressed, normal affect, normal respiratory effort. Examination patient does have some redness around the right elbow but there is no ulcer no skin breakdown no bursitis no signs of infection.  Discussed the patient could benefit from a pull-up  bar or elbow pads from a medical supply store.  Examination the left lower extremity patient has venous stasis insufficiency with swelling pitting edema there is brawny skin color changes but no ulcers no cellulitis.  Examination of the right transtibial amputation the midline ulcer is 10 x 5 mm and 1 mm deep with good bleeding granulation tissue no exposed bone or tendon no cellulitis.  The lateral ulcer is 1 cm diameter 1 cm deep and is ischemic but no exposed bone or tendon.  No cellulitis.  Imaging: No results found. No images are attached to the encounter.  Labs: Lab Results  Component Value Date   HGBA1C 5.7 (H) 03/26/2018   HGBA1C 5.0 07/12/2017   HGBA1C 5.8 (H) 07/06/2015   ESRSEDRATE 24 (H) 03/24/2018   ESRSEDRATE 15 04/18/2017   ESRSEDRATE 5 09/17/2007   CRP 3.1 (H) 05/11/2018   CRP 1.8 (H) 03/24/2018   CRP <0.8 04/24/2017   LABURIC 6.0 03/09/2016   REPTSTATUS 05/14/2018 FINAL 05/09/2018   GRAMSTAIN  07/15/2017    WBC PRESENT,BOTH PMN AND MONONUCLEAR NO ORGANISMS SEEN Performed at [facility], [place] [address] [place], [place] [address]    CULT  05/09/2018    NO GROWTH 5 DAYS Performed at [facility] [address], [place], [place] [address]    LABORGA ESCHERICHIA COLI (A) 09/14/2017     Lab Results  Component Value Date   ALBUMIN 3.0 (L) 05/09/2018  ALBUMIN 2.5 (L) 03/31/2018   ALBUMIN 2.7 (L) 03/30/2018   PREALBUMIN 36 (H) 11/22/2017   PREALBUMIN 28.8 04/24/2017   LABURIC 6.0 03/09/2016    Lab Results  Component Value Date   MG 2.0 05/12/2018   MG 1.9 05/11/2018   MG 1.9 03/31/2018   No results found for: VD25OH  Lab Results  Component Value Date   PREALBUMIN 36 (H) 11/22/2017   PREALBUMIN 28.8 04/24/2017   CBC EXTENDED Latest Ref Rng & Units 05/12/2018 05/11/2018 05/10/2018  WBC 4.0 - 10.5 K/uL 6.2 6.0 6.4  RBC 4.22 - 5.81 MIL/uL 2.72(L) 2.73(L) 2.95(L)  HGB 13.0 - 17.0 g/dL 8.2(L) 8.3(L) 8.8(L)  HCT 39.0 - 52.0 % 26.4(L) 26.6(L)  28.7(L)  PLT 150 - 400 K/uL 186 195 210  NEUTROABS 1.7 - 7.7 K/uL 4.0 3.8 -  LYMPHSABS 0.7 - 4.0 K/uL 1.1 1.1 -     Body mass index is 22.74 kg/m.  Orders:  No orders of the defined types were placed in this encounter.  No orders of the defined types were placed in this encounter.    Procedures: No procedures performed  Clinical Data: No additional findings.  ROS:  All other systems negative, except as noted in the HPI. Review of Systems  Objective: Vital Signs: Ht 5\' 9"  (1.753 m)   Wt 154 lb (69.9 kg)   BMI 22.74 kg/m   Specialty Comments:  No specialty comments available.  PMFS History: Patient Active Problem List   Diagnosis Date Noted  . Wound infection 05/09/2018  . AKI (acute kidney injury) ([place])   . S/P BKA (below knee amputation) unilateral, right ([place])   . Unilateral complete BKA, right, initial encounter ([place])   . Tachypnea   . Post-operative pain   . Subacute osteomyelitis, right ankle and foot ([place])   . Paralysis of right lower extremity ([place])   . TIA (transient ischemic attack) 03/24/2018  . Encephalopathy 03/24/2018  . Cellulitis 03/15/2018  . Hypernatremia 03/15/2018  . Persistent atrial fibrillation 11/10/2017  . Severe muscle deconditioning 11/10/2017  . Hemorrhage 10/22/2017  . CAD S/P percutaneous coronary angioplasty 10/22/2017  . Acute hypoxemic respiratory failure ([place]) 07/17/2017  . Aspiration syndrome, subsequent encounter 07/11/2017  . Aspiration pneumonia ([place]) 07/01/2017  . Acute encephalopathy 07/01/2017  . Thrombocytopenia ([place]) 07/01/2017  . Chronic diastolic (congestive) heart failure ([place]) 07/01/2017  . Anemia of chronic disease 07/01/2017  . Troponin level elevated 07/01/2017  . Chronic atrial fibrillation 07/01/2017  . Goals of care, counseling/discussion   . Palliative care by specialist   . Acute metabolic encephalopathy 46/80/3212  . CKD (chronic kidney disease) stage 3, GFR 30-59 ml/min (HCC)   . CAP (community  acquired pneumonia) 04/17/2017  . Hallux rigidus, right foot 03/10/2016  . Lumbosacral spondylosis without myelopathy 10/29/2015  . Memory difficulty 09/22/2015  . Abnormality of gait 09/22/2015  . Hyperglycemia 07/06/2015  . Chronic pain 07/06/2015  . Chronic insomnia 03/30/2015  . Transient alteration of awareness 03/30/2015  . Abnormal liver function   . Altered mental status 01/31/2015  . Essential hypertension 01/31/2015  . Constipation 01/31/2015  . Hypothyroidism 01/31/2015  . Seizure disorder ([place]) 01/31/2015  . Bladder outlet obstruction 01/31/2015  . GERD (gastroesophageal reflux disease) 01/31/2015  . Chronic back pain 01/31/2015  . Acute kidney injury ([place]) 01/31/2015   Past Medical History:  Diagnosis Date  . Abnormality of gait 09/22/2015  . Arthritis   . Atrial fibrillation ([place])   . CAD (coronary artery disease)    Stent  to RCA, Penta stent, 99% reduced to 0% 2002.  . Cancer ([place])    skin CA removed from back  . Chronic insomnia 03/30/2015  . Complication of anesthesia    trouble waking up  . GERD (gastroesophageal reflux disease)   . Hypercholesteremia   . Hypertension   . Hypothyroidism   . Memory difficulty 09/22/2015  . Osteoarthritis   . Seizures ([place])   . Sepsis ([place]) 05/2017  . Transient alteration of awareness 03/30/2015  . Vertigo    hx of    Family History  Problem Relation Age of Onset  . Hypertension Mother   . Cancer Mother   . Kidney failure Father   . Heart disease Father     Past Surgical History:  Procedure Laterality Date  . AMPUTATION Right 03/28/2018   Procedure: AMPUTATION BELOW KNEE;  Surgeon: Newt Minion, MD;  Location: [facility];  Service: Orthopedics;  Laterality: Right;  . BACK SURGERY    . EYE SURGERY     Bilateral Cataract surgery   . HERNIA REPAIR    . I&D EXTREMITY Right 05/10/2015   Procedure: IRRIGATION AND DEBRIDEMENT EXTREMITY;  Surgeon: Leanora Cover, MD;  Location: [facility];  Service: Orthopedics;   Laterality: Right;  . KNEE ARTHROPLASTY     right knee X 2; left knee once  . LAMINECTOMY     X 6  . POSTERIOR CERVICAL FUSION/FORAMINOTOMY  01/28/2012   Procedure: POSTERIOR CERVICAL FUSION/FORAMINOTOMY LEVEL 3;  Surgeon: [name], MD;  Location: [facility];  Service: Neurosurgery;  Laterality: Left;  Posterior Cervical Five-Thoracic One Fusion, Arthrodesis with LEFT Cervical Seven-thoracic One Laminectomy, Foraminotomy and Resection of Synovial Cyst  . POSTERIOR CERVICAL FUSION/FORAMINOTOMY N/A 01/29/2013   Procedure: POSTERIOR CERVICAL FUSION/FORAMINOTOMY LEVEL 1 and C2-5 Posteriolateral Arthrodesis;  Surgeon: [name], MD;  Location: [facility];  Service: Neurosurgery;  Laterality: N/A;  C2-C3 Laminectomy C2-C3 posterior cervical arthrodesis  . TONSILLECTOMY     Social History   Occupational History  . Occupation: retired Software engineer  Tobacco Use  . Smoking status: Former Research scientist (life sciences)  . Smokeless tobacco: Never Used  Substance and Sexual Activity  . Alcohol use: Not Currently    Comment: rare  . Drug use: No  . Sexual activity: Not Currently

## 2018-11-08 ENCOUNTER — Encounter (HOSPITAL_COMMUNITY): Payer: Self-pay

## 2018-11-08 ENCOUNTER — Emergency Department (HOSPITAL_COMMUNITY)
Admission: EM | Admit: 2018-11-08 | Discharge: 2018-11-08 | Disposition: A | Discharge status: Home / Self Care | Payer: Medicare Other | Attending: Emergency Medicine | Admitting: Emergency Medicine

## 2018-11-08 ENCOUNTER — Other Ambulatory Visit: Payer: Self-pay | Admitting: Cardiovascular Disease

## 2018-11-08 DIAGNOSIS — I5032 Chronic diastolic (congestive) heart failure: Secondary | ICD-10-CM | POA: Insufficient documentation

## 2018-11-08 DIAGNOSIS — N183 Chronic kidney disease, stage 3 (moderate): Secondary | ICD-10-CM | POA: Insufficient documentation

## 2018-11-08 DIAGNOSIS — Z87891 Personal history of nicotine dependence: Secondary | ICD-10-CM | POA: Insufficient documentation

## 2018-11-08 DIAGNOSIS — E039 Hypothyroidism, unspecified: Secondary | ICD-10-CM | POA: Diagnosis not present

## 2018-11-08 DIAGNOSIS — I130 Hypertensive heart and chronic kidney disease with heart failure and stage 1 through stage 4 chronic kidney disease, or unspecified chronic kidney disease: Secondary | ICD-10-CM | POA: Diagnosis not present

## 2018-11-08 DIAGNOSIS — Z8673 Personal history of transient ischemic attack (TIA), and cerebral infarction without residual deficits: Secondary | ICD-10-CM | POA: Diagnosis not present

## 2018-11-08 DIAGNOSIS — N3001 Acute cystitis with hematuria: Secondary | ICD-10-CM | POA: Insufficient documentation

## 2018-11-08 DIAGNOSIS — Z85828 Personal history of other malignant neoplasm of skin: Secondary | ICD-10-CM | POA: Diagnosis not present

## 2018-11-08 DIAGNOSIS — I2510 Atherosclerotic heart disease of native coronary artery without angina pectoris: Secondary | ICD-10-CM | POA: Diagnosis not present

## 2018-11-08 DIAGNOSIS — Z7982 Long term (current) use of aspirin: Secondary | ICD-10-CM | POA: Insufficient documentation

## 2018-11-08 DIAGNOSIS — Z79899 Other long term (current) drug therapy: Secondary | ICD-10-CM | POA: Diagnosis not present

## 2018-11-08 DIAGNOSIS — R319 Hematuria, unspecified: Secondary | ICD-10-CM | POA: Diagnosis present

## 2018-11-08 DIAGNOSIS — R310 Gross hematuria: Secondary | ICD-10-CM

## 2018-11-08 LAB — BASIC METABOLIC PANEL
Anion gap: 10 (ref 5–15)
Anion gap: 9 (ref 5–15)
BUN: 36 mg/dL — ABNORMAL HIGH (ref 8–23)
BUN: 34 mg/dL — ABNORMAL HIGH (ref 8–23)
CO2: 21 mmol/L — ABNORMAL LOW (ref 22–32)
CO2: 25 mmol/L (ref 22–32)
Calcium: 7.9 mg/dL — ABNORMAL LOW (ref 8.9–10.3)
Calcium: 8.2 mg/dL — ABNORMAL LOW (ref 8.9–10.3)
Chloride: 106 mmol/L (ref 98–111)
Chloride: 107 mmol/L (ref 98–111)
Creatinine, Ser: 1.21 mg/dL (ref 0.61–1.24)
Creatinine, Ser: 1.11 mg/dL (ref 0.61–1.24)
GFR calc Af Amer: >60 mL/min (ref >60)
GFR calc Af Amer: >60 mL/min (ref >60)
GFR calc non Af Amer: 55 mL/min — ABNORMAL LOW (ref >60)
GFR calc non Af Amer: >60 mL/min (ref >60)
Glucose, Bld: 134 mg/dL — ABNORMAL HIGH (ref 70–99)
Glucose, Bld: 123 mg/dL — ABNORMAL HIGH (ref 70–99)
Potassium: 5 mmol/L (ref 3.5–5.1)
Potassium: 4.1 mmol/L (ref 3.5–5.1)
Sodium: 137 mmol/L (ref 135–145)
Sodium: 141 mmol/L (ref 135–145)

## 2018-11-08 LAB — CBC WITH DIFFERENTIAL/PLATELET
Abs Immature Granulocytes: 0.17 10*3/uL — ABNORMAL HIGH (ref 0.00–0.07)
Basophils Absolute: 0 10*3/uL (ref 0.0–0.1)
Basophils Relative: 0 %
Eosinophils Absolute: 0.1 10*3/uL (ref 0.0–0.5)
Eosinophils Relative: 1 %
HCT: 34.6 % — ABNORMAL LOW (ref 39.0–52.0)
Hemoglobin: 10.9 g/dL — ABNORMAL LOW (ref 13.0–17.0)
Immature Granulocytes: 2 %
Lymphocytes Relative: 8 %
Lymphs Abs: 0.7 10*3/uL (ref 0.7–4.0)
MCH: 33.1 pg (ref 26.0–34.0)
MCHC: 31.5 g/dL (ref 30.0–36.0)
MCV: 105.2 fL — ABNORMAL HIGH (ref 80.0–100.0)
Monocytes Absolute: 0.8 10*3/uL (ref 0.1–1.0)
Monocytes Relative: 9 %
Neutro Abs: 7.8 10*3/uL — ABNORMAL HIGH (ref 1.7–7.7)
Neutrophils Relative %: 80 %
Platelets: 128 10*3/uL — ABNORMAL LOW (ref 150–400)
RBC: 3.29 MIL/uL — ABNORMAL LOW (ref 4.22–5.81)
RDW: 16.3 % — ABNORMAL HIGH (ref 11.5–15.5)
WBC: 9.6 10*3/uL (ref 4.0–10.5)
nRBC: 0 % (ref 0.0–0.2)

## 2018-11-08 LAB — URINALYSIS, ROUTINE W REFLEX MICROSCOPIC

## 2018-11-08 LAB — URINALYSIS, MICROSCOPIC (REFLEX)

## 2018-11-08 MED ORDER — CEPHALEXIN 500 MG PO CAPS: 500.0000 mg | ORAL_CAPSULE | Freq: Two times a day (BID) | ORAL | 0 refills | Status: DC

## 2018-11-08 MED ORDER — SODIUM CHLORIDE 0.9 % IV SOLN
1.0000 g | Freq: Once | INTRAVENOUS | Status: AC
  Administered 2018-11-08: 1 g via INTRAVENOUS
  Filled 2018-11-08: qty 10

## 2018-11-08 NOTE — ED Notes (Signed)
Discharge instructions and prescription discussed with Pt. Pt verbalized understanding. Pt stable and leaving via WC.  

## 2018-11-08 NOTE — ED Notes (Signed)
Pt stated that it burned a little when urinating

## 2018-11-08 NOTE — ED Triage Notes (Signed)
Pt transported by EMS from home after episode of dark red blood and pain with urination. Hx of recent UTI No blood thinners  Afebrile en route, A&Ox4 BP: 124/70 HR: 103 (hx of Afib) 97% RA 97.7 temp

## 2018-11-08 NOTE — ED Provider Notes (Signed)
[facility] EMERGENCY DEPARTMENT Provider Note   CSN: [id number] Arrival date & time: 11/08/18  1115     History   Chief Complaint Chief Complaint  Patient presents with  . Hematuria    HPI [name] is a 85 y.o. male.     Patient is 85 year old male who presents with hematuria.  He states as he was eating breakfast this morning, he felt the urge to urinate and just before he was able to urinate he felt a sharp pain in his penis.  Following that he noticed some yellow urine mixed with blood/blood clots.  He denies any ongoing pain.  He has not had any further episodes.  He did not have any difficulty getting his urine out.  He denies any fevers.  No nausea or vomiting.  He is on a baby aspirin a day but no other anticoagulants.  He did have a similar episode about a year ago associated with cystitis.  At that time he was on Eliquis but he is no longer on his anticoagulants.  He denies any recent urologic procedures.     Past Medical History:  Diagnosis Date  . Abnormality of gait 09/22/2015  . Arthritis   . Atrial fibrillation ([place])   . CAD (coronary artery disease)    Stent to RCA, Penta stent, 99% reduced to 0% 2002.  . Cancer ([place])    skin CA removed from back  . Chronic insomnia 03/30/2015  . Complication of anesthesia    trouble waking up  . GERD (gastroesophageal reflux disease)   . Hypercholesteremia   . Hypertension   . Hypothyroidism   . Memory difficulty 09/22/2015  . Osteoarthritis   . Seizures ([place])   . Sepsis ([place]) 05/2017  . Transient alteration of awareness 03/30/2015  . Vertigo    hx of    Patient Active Problem List   Diagnosis Date Noted  . Wound infection 05/09/2018  . AKI (acute kidney injury) ([place])   . S/P BKA (below knee amputation) unilateral, right ([place])   . Unilateral complete BKA, right, initial encounter ([place])   . Tachypnea   . Post-operative pain   . Subacute osteomyelitis, right ankle and foot ([place])   .  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 TIA (transient ischemic attack) 03/24/2018  . Encephalopathy 03/24/2018  . Cellulitis 03/15/2018  . Hypernatremia 03/15/2018  . Persistent atrial fibrillation 11/10/2017  . Severe muscle deconditioning 11/10/2017  . Hemorrhage 10/22/2017  . CAD S/P percutaneous coronary angioplasty 10/22/2017  . Acute hypoxemic respiratory failure ([place]) 07/17/2017  . Aspiration syndrome, subsequent encounter 07/11/2017  . Aspiration pneumonia ([place]) 07/01/2017  . Acute encephalopathy 07/01/2017  . Thrombocytopenia ([place]) 07/01/2017  . Chronic diastolic (congestive) heart failure ([place]) 07/01/2017  . Anemia of chronic disease 07/01/2017  . Troponin level elevated 07/01/2017  . Chronic atrial fibrillation 07/01/2017  . Goals of care, counseling/discussion   . Palliative care by specialist   . Acute metabolic encephalopathy 123456  . CKD (chronic kidney disease) stage 3, GFR 30-59 ml/min (HCC)   . CAP (community acquired pneumonia) 04/17/2017  . Hallux rigidus, right foot 03/10/2016  . Lumbosacral spondylosis without myelopathy 10/29/2015  . Memory difficulty 09/22/2015  . Abnormality of gait 09/22/2015  . Hyperglycemia 07/06/2015  . Chronic pain 07/06/2015  . Chronic insomnia 03/30/2015  . Transient alteration of awareness 03/30/2015  . Abnormal liver function   . Altered mental status 01/31/2015  . Essential hypertension 01/31/2015  . Constipation 01/31/2015  .  Hypothyroidism 01/31/2015  . Seizure disorder ([place]) 01/31/2015  . Bladder outlet obstruction 01/31/2015  . GERD (gastroesophageal reflux disease) 01/31/2015  . Chronic back pain 01/31/2015  . Acute kidney injury ([place]) 01/31/2015    Past Surgical History:  Procedure Laterality Date  . AMPUTATION Right 03/28/2018   Procedure: AMPUTATION BELOW KNEE;  Surgeon: Newt Minion, MD;  Location: [facility];  Service: Orthopedics;  Laterality: Right;  . BACK SURGERY    . EYE SURGERY     Bilateral  Cataract surgery   . HERNIA REPAIR    . I&D EXTREMITY Right 05/10/2015   Procedure: IRRIGATION AND DEBRIDEMENT EXTREMITY;  Surgeon: Leanora Cover, MD;  Location: [facility];  Service: Orthopedics;  Laterality: Right;  . KNEE ARTHROPLASTY     right knee X 2; left knee once  . LAMINECTOMY     X 6  . LEG AMPUTATION BELOW KNEE Right 03/28/2018  . POSTERIOR CERVICAL FUSION/FORAMINOTOMY  01/28/2012   Procedure: POSTERIOR CERVICAL FUSION/FORAMINOTOMY LEVEL 3;  Surgeon: [name], MD;  Location: [facility];  Service: Neurosurgery;  Laterality: Left;  Posterior Cervical Five-Thoracic One Fusion, Arthrodesis with LEFT Cervical Seven-thoracic One Laminectomy, Foraminotomy and Resection of Synovial Cyst  . POSTERIOR CERVICAL FUSION/FORAMINOTOMY N/A 01/29/2013   Procedure: POSTERIOR CERVICAL FUSION/FORAMINOTOMY LEVEL 1 and C2-5 Posteriolateral Arthrodesis;  Surgeon: [name], MD;  Location: [facility];  Service: Neurosurgery;  Laterality: N/A;  C2-C3 Laminectomy C2-C3 posterior cervical arthrodesis  . TONSILLECTOMY          Home Medications    Prior to Admission medications   Medication Sig Start Date End Date Taking? Authorizing Provider  acetaminophen (TYLENOL) 650 MG CR tablet Take 650 mg by mouth every 8 (eight) hours as needed for pain.    [provider]  alendronate (FOSAMAX) 70 MG tablet Take 70 mg by mouth every Wednesday. Remain upright for 30-60 minutes. 01/06/12   [provider]  Amino Acids-Protein Hydrolys (FEEDING SUPPLEMENT, PRO-STAT SUGAR FREE 64,) LIQD Take 30 mLs by mouth 3 (three) times daily with meals.    [provider]  aspirin EC 81 MG tablet Take 81 mg by mouth daily.    [provider]  calcium carbonate (OSCAL) 1500 (600 Ca) MG TABS tablet Take 600 mg of elemental calcium by mouth daily after breakfast.    [provider]  cephALEXin (KEFLEX) 500 MG capsule Take 1 capsule (500 mg total) by mouth  2 (two) times daily. 11/08/18   Malvin Johns, MD  Cholecalciferol (VITAMIN D) 125 MCG (5000 UT) CAPS Take 1 capsule by mouth daily.    [provider]  cyanocobalamin 500 MCG tablet Take 500 mcg by mouth at bedtime. Vitamin B12    [provider]  diclofenac sodium (VOLTAREN) 1 % GEL Apply 2 g topically daily as needed (pain).    [provider]  diltiazem (CARDIZEM CD) 240 MG 24 hr capsule Take 240 mg every morning   Hold if systolic blood pressure is less than 100. 06/19/18   Croitoru, Mihai, MD  divalproex (DEPAKOTE) 250 MG DR tablet Take 250-500 mg by mouth See admin instructions. Take one tablet (250 mg) by mouth every morning and two tablets (500 mg) every evening (for epilepsy)    [provider]  doxazosin (CARDURA) 4 MG tablet Take 4 mg by mouth at bedtime.     [provider]  furosemide (LASIX) 20 MG tablet Take 1 tablet (20 mg total) by mouth daily. 07/24/18  10/22/18  Croitoru, Mihai, MD  HYDROcodone-acetaminophen (NORCO) 10-325 MG tablet Take 1 tablet by mouth every 4 (four) hours as needed for moderate pain.    [provider]  Lacosamide (VIMPAT) 100 MG TABS Take 1 tablet (100 mg total) by mouth 2 (two) times a day. 08/27/18   Cameron Sprang, MD  lacosamide (VIMPAT) 50 MG TABS tablet Take 25mg  twice a day for a week, then increase to 50 mg  Twice a day for 1 week then increase to 75mg  twice a day for 1 week then increase to 100 mg twice a day 08/27/18   Cameron Sprang, MD  levothyroxine (SYNTHROID, LEVOTHROID) 150 MCG tablet Take 150 mcg by mouth daily before breakfast. 12/27/11   [provider]  Lidocaine (ASPERCREME LIDOCAINE) 4 % PTCH Apply 1 patch topically 2 (two) times daily as needed.     [provider]  metoprolol tartrate (LOPRESSOR) 25 MG tablet Take 1 tablet (25mg ) by mouth twice daily. Hold med for SBP<90 & DBP<50 Per Dr. Sallyanne Kuster    [provider]  Multiple Vitamins-Minerals  (CERTAVITE/ANTIOXIDANTS) TABS Take 1 tablet by mouth every evening. With Iron 18 mg/ folic acid [id number] mcg    [provider]  mupirocin ointment (BACTROBAN) 2 % Apply 1 application topically daily. Apply to right lateral leg wound daily 09/11/18   Rayburn, Neta Mends, PA-C  nitroGLYCERIN (NITROSTAT) 0.4 MG SL tablet Place 1 tablet (0.4 mg total) under the tongue every 5 (five) minutes as needed for chest pain (call 911 afer 3 doses and if chest pain persists). 06/19/18   Croitoru, Mihai, MD  omeprazole (PRILOSEC) 20 MG capsule Take 20 mg by mouth daily before breakfast.  12/23/11   [provider]  potassium chloride SA (K-DUR) 20 MEQ tablet Take 1/2 tablet ( 10 mEq ) by mouth daily 06/26/18   Croitoru, Mihai, MD  pravastatin (PRAVACHOL) 40 MG tablet Take 40 mg by mouth every evening.  10/29/14   [provider]  predniSONE (DELTASONE) 5 MG tablet Take 2 tablets (10 mg total) by mouth daily with breakfast. 04/02/18   Debbe Odea, MD  silver sulfADIAZINE (SILVADENE) 1 % cream Apply 1 application topically daily. Apply to affected area daily plus dry dressing 10/07/18   Newt Minion, MD    Family History Family History  Problem Relation Age of Onset  . Hypertension Mother   . Cancer Mother   . Kidney failure Father   . Heart disease Father     Social History Social History   Tobacco Use  . Smoking status: Former Research scientist (life sciences)  . Smokeless tobacco: Never Used  Substance Use Topics  . Alcohol use: Not Currently    Comment: rare  . Drug use: No     Allergies   Eliquis [apixaban], Demerol [meperidine], Keppra [levetiracetam], and Other   Review of Systems Review of Systems  Constitutional: Negative for chills, diaphoresis, fatigue and fever.  HENT: Negative for congestion, rhinorrhea and sneezing.   Eyes: Negative.   Respiratory: Negative for cough, chest tightness and shortness of breath.   Cardiovascular: Negative for chest pain and leg swelling.   Gastrointestinal: Negative for abdominal pain, blood in stool, diarrhea, nausea and vomiting.  Genitourinary: Positive for hematuria and penile pain. Negative for difficulty urinating, flank pain and frequency.  Musculoskeletal: Negative for arthralgias and back pain.  Skin: Negative for rash.  Neurological: Negative for dizziness, speech difficulty, weakness, numbness and headaches.     Physical Exam Updated Vital Signs BP  110/65   Pulse 63   Temp 98 F (36.7 C) (Oral)   Resp 12   Ht 5\' 9"  (1.753 m)   Wt 70.3 kg   SpO2 98%   BMI 22.89 kg/m   Physical Exam Constitutional:      Appearance: He is well-developed.  HENT:     Head: Normocephalic and atraumatic.  Eyes:     Pupils: Pupils are equal, round, and reactive to light.  Neck:     Musculoskeletal: Normal range of motion and neck supple.  Cardiovascular:     Rate and Rhythm: Normal rate and regular rhythm.     Heart sounds: Normal heart sounds.  Pulmonary:     Effort: Pulmonary effort is normal. No respiratory distress.     Breath sounds: Normal breath sounds. No wheezing or rales.  Chest:     Chest wall: No tenderness.  Abdominal:     General: Bowel sounds are normal.     Palpations: Abdomen is soft.     Tenderness: There is no abdominal tenderness. There is no guarding or rebound.  Genitourinary:    Comments: Normal external male circumcised genitalia.  No blood at the meatus.  No tenderness to the scrotum or testicles.  No pain in the inguinal canal Musculoskeletal: Normal range of motion.  Lymphadenopathy:     Cervical: No cervical adenopathy.  Skin:    General: Skin is warm and dry.     Findings: No rash.  Neurological:     Mental Status: He is alert and oriented to person, place, and time.      ED Treatments / Results  Labs (all labs ordered are listed, but only abnormal results are displayed) Labs Reviewed  BASIC METABOLIC PANEL - Abnormal; Notable for the following components:      Result Value    CO2 21 (*)    Glucose, Bld 134 (*)    BUN 36 (*)    Calcium 7.9 (*)    GFR calc non Af Amer 55 (*)    All other components within normal limits  URINALYSIS, ROUTINE W REFLEX MICROSCOPIC - Abnormal; Notable for the following components:   Color, Urine RED (*)    APPearance TURBID (*)    Glucose, UA   (*)    Value: TEST NOT REPORTED DUE TO COLOR INTERFERENCE OF URINE PIGMENT   Hgb urine dipstick   (*)    Value: TEST NOT REPORTED DUE TO COLOR INTERFERENCE OF URINE PIGMENT   Bilirubin Urine   (*)    Value: TEST NOT REPORTED DUE TO COLOR INTERFERENCE OF URINE PIGMENT   Ketones, ur   (*)    Value: TEST NOT REPORTED DUE TO COLOR INTERFERENCE OF URINE PIGMENT   Protein, ur   (*)    Value: TEST NOT REPORTED DUE TO COLOR INTERFERENCE OF URINE PIGMENT   Nitrite   (*)    Value: TEST NOT REPORTED DUE TO COLOR INTERFERENCE OF URINE PIGMENT   Leukocytes,Ua   (*)    Value: TEST NOT REPORTED DUE TO COLOR INTERFERENCE OF URINE PIGMENT   All other components within normal limits  CBC WITH DIFFERENTIAL/PLATELET - Abnormal; Notable for the following components:   RBC 3.29 (*)    Hemoglobin 10.9 (*)    HCT 34.6 (*)    MCV 105.2 (*)    RDW 16.3 (*)    Platelets 128 (*)    Neutro Abs 7.8 (*)    Abs Immature Granulocytes 0.17 (*)    All  other components within normal limits  BASIC METABOLIC PANEL - Abnormal; Notable for the following components:   Glucose, Bld 123 (*)    BUN 34 (*)    Calcium 8.2 (*)    All other components within normal limits  URINALYSIS, MICROSCOPIC (REFLEX) - Abnormal; Notable for the following components:   Bacteria, UA RARE (*)    All other components within normal limits  URINE CULTURE  CBC WITH DIFFERENTIAL/PLATELET    EKG None  Radiology No results found.  Procedures Procedures (including critical care time)  Medications Ordered in ED Medications  cefTRIAXone (ROCEPHIN) 1 g in sodium chloride 0.9 % 100 mL IVPB (1 g Intravenous New Bag/Given 11/08/18 1337)      Initial Impression / Assessment and Plan / ED Course  I have reviewed the triage vital signs and the nursing notes.  Pertinent labs & imaging results that were available during my care of the patient were reviewed by me and considered in my medical decision making (see chart for details).        Patient presents with gross hematuria.  He otherwise is well-appearing.  His vital signs have been stable.  He has no suggestions of sepsis.  His labs are non-concerning.  He has a mild anemia but his hemoglobin is improved from his last values.  His urine does show signs of infection and he was treated with Rocephin in the ED and discharged with a prescription for Keflex.  He had a similar occurrence about a year ago with cystitis that resolved with antibiotics.  He states that he is able to urinate without difficulty.  He has no signs of retention.  He states that last time he urinated here in the ED, it did appear to be less bloody.  He is not on anticoagulants other than a baby aspirin.  He was discharged home in good condition.  He was given return precautions and advised to follow-up with his PCP to make sure that the blood is clearing.  Final Clinical Impressions(s) / ED Diagnoses   Final diagnoses:  Gross hematuria  Acute cystitis with hematuria    ED Discharge Orders         Ordered    cephALEXin (KEFLEX) 500 MG capsule  2 times daily     11/08/18 1427           Malvin Johns, MD 11/08/18 1429

## 2018-11-10 ENCOUNTER — Encounter: Payer: Self-pay | Admitting: Orthopedic Surgery

## 2018-11-10 ENCOUNTER — Ambulatory Visit (INDEPENDENT_AMBULATORY_CARE_PROVIDER_SITE_OTHER): Payer: Medicare Other | Admitting: Orthopedic Surgery

## 2018-11-10 VITALS — Ht 69.0 in | Wt 155.0 lb

## 2018-11-10 DIAGNOSIS — Z9861 Coronary angioplasty status: Secondary | ICD-10-CM | POA: Diagnosis not present

## 2018-11-10 DIAGNOSIS — I2510 Atherosclerotic heart disease of native coronary artery without angina pectoris: Secondary | ICD-10-CM

## 2018-11-10 DIAGNOSIS — Z89511 Acquired absence of right leg below knee: Secondary | ICD-10-CM | POA: Diagnosis not present

## 2018-11-10 DIAGNOSIS — T8781 Dehiscence of amputation stump: Secondary | ICD-10-CM

## 2018-11-10 LAB — URINE CULTURE: Culture: >=100000 — AB

## 2018-11-11 ENCOUNTER — Telehealth: Payer: Self-pay | Admitting: Orthopedic Surgery

## 2018-11-11 ENCOUNTER — Telehealth: Payer: Self-pay | Admitting: Emergency Medicine

## 2018-11-11 NOTE — Telephone Encounter (Signed)
Post ED Visit - Positive Culture Follow-up: Successful Patient Follow-Up  Culture assessed and recommendations reviewed by:  []  Elenor Quinones, Pharm.D. [x]  Heide Guile, Pharm.D., BCPS AQ-ID []  Parks Neptune, Pharm.D., BCPS []  Alycia Rossetti, Pharm.D., BCPS []  [place], Pharm.D., BCPS, AAHIVP []  Legrand Como, Pharm.D., BCPS, AAHIVP []  Salome Arnt, PharmD, BCPS []  Johnnette Gourd, PharmD, BCPS []  Hughes Better, PharmD, BCPS []  Leeroy Cha, PharmD  Positive urine culture  []  Patient discharged without antimicrobial prescription and treatment is now indicated [x]  Organism is resistant to prescribed ED discharge antimicrobial []  Patient with positive blood cultures  Changes discussed with ED provider: Lenn Sink PA New antibiotic prescription bactrim DS 1 tablet bid x 7 days Called to [facility] [phone]    Hazle Nordmann 11/11/2018, 10:28 AM

## 2018-11-11 NOTE — Telephone Encounter (Signed)
Ms. Haasch called and states that Gid's surgery needs to be postponed.  He has a urinary tract infection that is not getting better with his current antibiotic.  His doctor switched his medication and Ms. Chrostowski would like to give the new meds time to work before him having his BKA revision.  Ok to hold for now?

## 2018-11-11 NOTE — Progress Notes (Signed)
ED Antimicrobial Stewardship Positive Culture Follow Up   [name] is an 85 y.o. male who presented to [facility] on 11/08/2018 with a chief complaint of  Chief Complaint  Patient presents with  . Hematuria    Recent Results (from the past 720 hour(s))  Urine culture     Status: Abnormal   Collection Time: 11/08/18 11:42 AM   Specimen: Urine, Clean Catch  Result Value Ref Range Status   Specimen Description URINE, CLEAN CATCH  Final   Special Requests   Final    NONE Performed at [facility], [address], [place], [place] [address]    Culture >=100,000 COLONIES/mL ESCHERICHIA COLI (A)  Final   Report Status 11/10/2018 FINAL  Final   Organism ID, Bacteria ESCHERICHIA COLI (A)  Final      Susceptibility   Escherichia coli - MIC*    AMPICILLIN >=32 RESISTANT Resistant     CEFAZOLIN >=64 RESISTANT Resistant     CEFTRIAXONE <=1 SENSITIVE Sensitive     CIPROFLOXACIN <=0.25 SENSITIVE Sensitive     GENTAMICIN <=1 SENSITIVE Sensitive     IMIPENEM <=0.25 SENSITIVE Sensitive     NITROFURANTOIN <=16 SENSITIVE Sensitive     TRIMETH/SULFA <=20 SENSITIVE Sensitive     AMPICILLIN/SULBACTAM >=32 RESISTANT Resistant     PIP/TAZO >=128 RESISTANT Resistant     Extended ESBL NEGATIVE Sensitive     * >=100,000 COLONIES/mL ESCHERICHIA COLI    []  Treated with cephalexin, organism resistant to prescribed antimicrobial  New antibiotic prescription: Bactrim DS 1 tablet BID x 7 days.  Stop cephalexin  ED Provider: Lenn Sink, PA-C   Candie Mile 11/11/2018, 9:12 AM Clinical Pharmacist Monday - Friday phone -  [phone] Saturday - Sunday phone - [phone]

## 2018-11-12 NOTE — Telephone Encounter (Signed)
Okay to postpone surgery, have them call when he is ready to proceed with surgery.

## 2018-11-14 ENCOUNTER — Encounter (HOSPITAL_COMMUNITY): Admission: RE | Payer: Self-pay | Source: Home / Self Care

## 2018-11-14 ENCOUNTER — Ambulatory Visit (HOSPITAL_COMMUNITY): Admission: RE | Admit: 2018-11-14 | Payer: Medicare Other | Source: Home / Self Care | Admitting: Orthopedic Surgery

## 2018-11-14 SURGERY — REVISION, AMPUTATION SITE
Anesthesia: General | Laterality: Right

## 2018-11-20 ENCOUNTER — Inpatient Hospital Stay: Payer: Medicare Other

## 2018-11-20 ENCOUNTER — Inpatient Hospital Stay: Payer: Medicare Other | Admitting: Orthopedic Surgery

## 2018-12-04 ENCOUNTER — Encounter: Payer: Self-pay | Admitting: Orthopedic Surgery

## 2018-12-04 ENCOUNTER — Ambulatory Visit (INDEPENDENT_AMBULATORY_CARE_PROVIDER_SITE_OTHER): Payer: Medicare Other | Admitting: Orthopedic Surgery

## 2018-12-04 VITALS — Ht 69.0 in | Wt 155.0 lb

## 2018-12-04 DIAGNOSIS — T8781 Dehiscence of amputation stump: Secondary | ICD-10-CM

## 2018-12-04 DIAGNOSIS — I2510 Atherosclerotic heart disease of native coronary artery without angina pectoris: Secondary | ICD-10-CM

## 2018-12-04 DIAGNOSIS — Z9861 Coronary angioplasty status: Secondary | ICD-10-CM

## 2018-12-04 DIAGNOSIS — Z89511 Acquired absence of right leg below knee: Secondary | ICD-10-CM | POA: Diagnosis not present

## 2018-12-06 ENCOUNTER — Encounter: Payer: Self-pay | Admitting: Orthopedic Surgery

## 2018-12-06 NOTE — Progress Notes (Signed)
Office Visit Note   Patient: [name]           Date of Birth: 1934/03/04           MRN: [id number] Visit Date: 12/04/2018              Requested by: [name], [place],  [place] [address] PCP: [name], MD  Chief Complaint  Patient presents with  . Right Leg - Follow-up      HPI: Patient is an 85 year old gentleman who is status post right transtibial amputation January 2020.  Patient states he does have some drainage has 2 small open wounds.  Assessment & Plan: Visit Diagnoses:  1. S/P BKA (below knee amputation) unilateral, right ([place])   2. Dehiscence of amputation stump (HCC)     Plan: Discussed treatment options with continued conservative treatment versus surgical revision of the ischemic ulcers with wound dehiscence.  Have recommended proceeding with revision amputation patient is undergone prolonged conservative therapy without improvement of the ischemic ulcers.  Patient wishes to follow-up in 4 weeks for reevaluation.  Follow-Up Instructions: Return in about 4 weeks (around 01/01/2019).   Ortho Exam  Patient is alert, oriented, no adenopathy, well-dressed, normal affect, normal respiratory effort. Examination patient has 2 ischemic ulcers on the right transtibial amputation the lateral ulcer has undermining with ischemic edges there is no exposed bone or tendon no purulent drainage the wound does tunnel.  Patient also has a chronic ischemic ulcer over the mid aspect of the transtibial amputation there is granulation tissue and bleeding no exposed bone or tendon there is no abscess no cellulitis in the lower extremity.  Imaging: No results found. No images are attached to the encounter.  Labs: Lab Results  Component Value Date   HGBA1C 5.7 (H) 03/26/2018   HGBA1C 5.0 07/12/2017   HGBA1C 5.8 (H) 07/06/2015   ESRSEDRATE 24 (H) 03/24/2018   ESRSEDRATE 15 04/18/2017   ESRSEDRATE 5 09/17/2007   CRP 3.1 (H) 05/11/2018   CRP  1.8 (H) 03/24/2018   CRP <0.8 04/24/2017   LABURIC 6.0 03/09/2016   REPTSTATUS 11/10/2018 FINAL 11/08/2018   GRAMSTAIN  07/15/2017    WBC PRESENT,BOTH PMN AND MONONUCLEAR NO ORGANISMS SEEN Performed at [facility], [place] [address] [place], [place] [address]    CULT >=100,000 COLONIES/mL ESCHERICHIA COLI (A) 11/08/2018   LABORGA ESCHERICHIA COLI (A) 11/08/2018     Lab Results  Component Value Date   ALBUMIN 3.0 (L) 05/09/2018   ALBUMIN 2.5 (L) 03/31/2018   ALBUMIN 2.7 (L) 03/30/2018   PREALBUMIN 36 (H) 11/22/2017   PREALBUMIN 28.8 04/24/2017   LABURIC 6.0 03/09/2016    Lab Results  Component Value Date   MG 2.0 05/12/2018   MG 1.9 05/11/2018   MG 1.9 03/31/2018   No results found for: VD25OH  Lab Results  Component Value Date   PREALBUMIN 36 (H) 11/22/2017   PREALBUMIN 28.8 04/24/2017   CBC EXTENDED Latest Ref Rng & Units 11/08/2018 05/12/2018 05/11/2018  WBC 4.0 - 10.5 K/uL 9.6 6.2 6.0  RBC 4.22 - 5.81 MIL/uL 3.29(L) 2.72(L) 2.73(L)  HGB 13.0 - 17.0 g/dL 10.9(L) 8.2(L) 8.3(L)  HCT 39.0 - 52.0 % 34.6(L) 26.4(L) 26.6(L)  PLT 150 - 400 K/uL 128(L) 186 195  NEUTROABS 1.7 - 7.7 K/uL 7.8(H) 4.0 3.8  LYMPHSABS 0.7 - 4.0 K/uL 0.7 1.1 1.1     Body mass index is 22.89 kg/m.  Orders:  No orders of the defined  types were placed in this encounter.  No orders of the defined types were placed in this encounter.    Procedures: No procedures performed  Clinical Data: No additional findings.  ROS:  All other systems negative, except as noted in the HPI. Review of Systems  Objective: Vital Signs: Ht 5\' 9"  (1.753 m)   Wt 155 lb (70.3 kg)   BMI 22.89 kg/m   Specialty Comments:  No specialty comments available.  PMFS History: Patient Active Problem List   Diagnosis Date Noted  . Wound infection 05/09/2018  . AKI (acute kidney injury) ([place])   . S/P BKA (below knee amputation) unilateral, right ([place])   . Unilateral complete BKA, right, initial encounter  ([place])   . Tachypnea   . Post-operative pain   . Subacute osteomyelitis, right ankle and foot ([place])   . Paralysis of right lower extremity ([place])   . TIA (transient ischemic attack) 03/24/2018  . Encephalopathy 03/24/2018  . Cellulitis 03/15/2018  . Hypernatremia 03/15/2018  . Persistent atrial fibrillation 11/10/2017  . Severe muscle deconditioning 11/10/2017  . Hemorrhage 10/22/2017  . CAD S/P percutaneous coronary angioplasty 10/22/2017  . Acute hypoxemic respiratory failure ([place]) 07/17/2017  . Aspiration syndrome, subsequent encounter 07/11/2017  . Aspiration pneumonia ([place]) 07/01/2017  . Acute encephalopathy 07/01/2017  . Thrombocytopenia ([place]) 07/01/2017  . Chronic diastolic (congestive) heart failure ([place]) 07/01/2017  . Anemia of chronic disease 07/01/2017  . Troponin level elevated 07/01/2017  . Chronic atrial fibrillation 07/01/2017  . Goals of care, counseling/discussion   . Palliative care by specialist   . Acute metabolic encephalopathy 123456  . CKD (chronic kidney disease) stage 3, GFR 30-59 ml/min (HCC)   . CAP (community acquired pneumonia) 04/17/2017  . Hallux rigidus, right foot 03/10/2016  . Lumbosacral spondylosis without myelopathy 10/29/2015  . Memory difficulty 09/22/2015  . Abnormality of gait 09/22/2015  . Hyperglycemia 07/06/2015  . Chronic pain 07/06/2015  . Chronic insomnia 03/30/2015  . Transient alteration of awareness 03/30/2015  . Abnormal liver function   . Altered mental status 01/31/2015  . Essential hypertension 01/31/2015  . Constipation 01/31/2015  . Hypothyroidism 01/31/2015  . Seizure disorder ([place]) 01/31/2015  . Bladder outlet obstruction 01/31/2015  . GERD (gastroesophageal reflux disease) 01/31/2015  . Chronic back pain 01/31/2015  . Acute kidney injury ([place]) 01/31/2015   Past Medical History:  Diagnosis Date  . Abnormality of gait 09/22/2015  . Arthritis   . Atrial fibrillation ([place])   . CAD (coronary artery disease)     Stent to RCA, Penta stent, 99% reduced to 0% 2002.  . Cancer ([place])    skin CA removed from back  . Chronic insomnia 03/30/2015  . Complication of anesthesia    trouble waking up  . GERD (gastroesophageal reflux disease)   . Hypercholesteremia   . Hypertension   . Hypothyroidism   . Memory difficulty 09/22/2015  . Osteoarthritis   . Seizures ([place])   . Sepsis ([place]) 05/2017  . Transient alteration of awareness 03/30/2015  . Vertigo    hx of    Family History  Problem Relation Age of Onset  . Hypertension Mother   . Cancer Mother   . Kidney failure Father   . Heart disease Father     Past Surgical History:  Procedure Laterality Date  . AMPUTATION Right 03/28/2018   Procedure: AMPUTATION BELOW KNEE;  Surgeon: Newt Minion, MD;  Location: [facility];  Service: Orthopedics;  Laterality: Right;  . BACK SURGERY    .  EYE SURGERY     Bilateral Cataract surgery   . HERNIA REPAIR    . I&D EXTREMITY Right 05/10/2015   Procedure: IRRIGATION AND DEBRIDEMENT EXTREMITY;  Surgeon: Leanora Cover, MD;  Location: [facility];  Service: Orthopedics;  Laterality: Right;  . KNEE ARTHROPLASTY     right knee X 2; left knee once  . LAMINECTOMY     X 6  . LEG AMPUTATION BELOW KNEE Right 03/28/2018  . POSTERIOR CERVICAL FUSION/FORAMINOTOMY  01/28/2012   Procedure: POSTERIOR CERVICAL FUSION/FORAMINOTOMY LEVEL 3;  Surgeon: [name], MD;  Location: [facility];  Service: Neurosurgery;  Laterality: Left;  Posterior Cervical Five-Thoracic One Fusion, Arthrodesis with LEFT Cervical Seven-thoracic One Laminectomy, Foraminotomy and Resection of Synovial Cyst  . POSTERIOR CERVICAL FUSION/FORAMINOTOMY N/A 01/29/2013   Procedure: POSTERIOR CERVICAL FUSION/FORAMINOTOMY LEVEL 1 and C2-5 Posteriolateral Arthrodesis;  Surgeon: [name], MD;  Location: [facility];  Service: Neurosurgery;  Laterality: N/A;  C2-C3 Laminectomy C2-C3 posterior cervical arthrodesis  . TONSILLECTOMY     Social  History   Occupational History  . Occupation: retired Software engineer  Tobacco Use  . Smoking status: Former Research scientist (life sciences)  . Smokeless tobacco: Never Used  Substance and Sexual Activity  . Alcohol use: Not Currently    Comment: rare  . Drug use: No  . Sexual activity: Not Currently

## 2018-12-06 NOTE — Progress Notes (Signed)
Office Visit Note   Patient: [name]           Date of Birth: January 05, 1934           MRN: [id number] Visit Date: 11/10/2018              Requested by: [name], [place],  [place] [id number] PCP: [name], MD  Chief Complaint  Patient presents with  . Right Leg - Routine Post Op    03/28/18 right BKA       HPI: Patient is an 85 year old gentleman who is 7 months status post right transtibial amputation with nonhealing ischemic ulcers.  Assessment & Plan: Visit Diagnoses:  1. Dehiscence of amputation stump ([place])   2. S/P BKA (below knee amputation) unilateral, right ([place])     Plan: Have recommended proceeding with revision of the amputation due to the chronic nature of ischemic ulcers patient wants to continue with conservative wound care.  Follow-Up Instructions: Return in about 4 weeks (around 12/08/2018).   Ortho Exam  Patient is alert, oriented, no adenopathy, well-dressed, normal affect, normal respiratory effort. Examination patient has 2 ischemic ulcers with no interval healing the lateral ulcer is 2 cm in diameter 1 cm deep with undermining the midline distal wound is 10 mm in diameter 1 mm deep with good granulation tissue there is no cellulitis no purulent drainage no exposed bone or tendon.  Imaging: No results found. No images are attached to the encounter.  Labs: Lab Results  Component Value Date   HGBA1C 5.7 (H) 03/26/2018   HGBA1C 5.0 07/12/2017   HGBA1C 5.8 (H) 07/06/2015   ESRSEDRATE 24 (H) 03/24/2018   ESRSEDRATE 15 04/18/2017   ESRSEDRATE 5 09/17/2007   CRP 3.1 (H) 05/11/2018   CRP 1.8 (H) 03/24/2018   CRP <0.8 04/24/2017   LABURIC 6.0 03/09/2016   REPTSTATUS 11/10/2018 FINAL 11/08/2018   GRAMSTAIN  07/15/2017    WBC PRESENT,BOTH PMN AND MONONUCLEAR NO ORGANISMS SEEN Performed at [facility], [place] [address] [place], [place] [address]    CULT >=100,000 COLONIES/mL ESCHERICHIA COLI (A) 11/08/2018   LABORGA ESCHERICHIA COLI (A) 11/08/2018     Lab Results  Component Value Date   ALBUMIN 3.0 (L) 05/09/2018   ALBUMIN 2.5 (L) 03/31/2018   ALBUMIN 2.7 (L) 03/30/2018   PREALBUMIN 36 (H) 11/22/2017   PREALBUMIN 28.8 04/24/2017   LABURIC 6.0 03/09/2016    Lab Results  Component Value Date   MG 2.0 05/12/2018   MG 1.9 05/11/2018   MG 1.9 03/31/2018   No results found for: VD25OH  Lab Results  Component Value Date   PREALBUMIN 36 (H) 11/22/2017   PREALBUMIN 28.8 04/24/2017   CBC EXTENDED Latest Ref Rng & Units 11/08/2018 05/12/2018 05/11/2018  WBC 4.0 - 10.5 K/uL 9.6 6.2 6.0  RBC 4.22 - 5.81 MIL/uL 3.29(L) 2.72(L) 2.73(L)  HGB 13.0 - 17.0 g/dL 10.9(L) 8.2(L) 8.3(L)  HCT 39.0 - 52.0 % 34.6(L) 26.4(L) 26.6(L)  PLT 150 - 400 K/uL 128(L) 186 195  NEUTROABS 1.7 - 7.7 K/uL 7.8(H) 4.0 3.8  LYMPHSABS 0.7 - 4.0 K/uL 0.7 1.1 1.1     Body mass index is 22.89 kg/m.  Orders:  No orders of the defined types were placed in this encounter.  No orders of the defined types were placed in this encounter.    Procedures: No procedures performed  Clinical Data: No additional findings.  ROS:  All other systems negative, except as noted in  the HPI. Review of Systems  Objective: Vital Signs: Ht 5\' 9"  (1.753 m)   Wt 155 lb (70.3 kg)   BMI 22.89 kg/m   Specialty Comments:  No specialty comments available.  PMFS History: Patient Active Problem List   Diagnosis Date Noted  . Wound infection 05/09/2018  . AKI (acute kidney injury) ([place])   . S/P BKA (below knee amputation) unilateral, right ([place])   . Unilateral complete BKA, right, initial encounter ([place])   . Tachypnea   . Post-operative pain   . Subacute osteomyelitis, right ankle and foot ([place])   . Paralysis of right lower extremity ([place])   . TIA (transient ischemic attack) 03/24/2018  . Encephalopathy 03/24/2018  . Cellulitis 03/15/2018  . Hypernatremia 03/15/2018  . Persistent atrial fibrillation 11/10/2017  . Severe  muscle deconditioning 11/10/2017  . Hemorrhage 10/22/2017  . CAD S/P percutaneous coronary angioplasty 10/22/2017  . Acute hypoxemic respiratory failure ([place]) 07/17/2017  . Aspiration syndrome, subsequent encounter 07/11/2017  . Aspiration pneumonia ([place]) 07/01/2017  . Acute encephalopathy 07/01/2017  . Thrombocytopenia ([place]) 07/01/2017  . Chronic diastolic (congestive) heart failure ([place]) 07/01/2017  . Anemia of chronic disease 07/01/2017  . Troponin level elevated 07/01/2017  . Chronic atrial fibrillation 07/01/2017  . Goals of care, counseling/discussion   . Palliative care by specialist   . Acute metabolic encephalopathy 123456  . CKD (chronic kidney disease) stage 3, GFR 30-59 ml/min (HCC)   . CAP (community acquired pneumonia) 04/17/2017  . Hallux rigidus, right foot 03/10/2016  . Lumbosacral spondylosis without myelopathy 10/29/2015  . Memory difficulty 09/22/2015  . Abnormality of gait 09/22/2015  . Hyperglycemia 07/06/2015  . Chronic pain 07/06/2015  . Chronic insomnia 03/30/2015  . Transient alteration of awareness 03/30/2015  . Abnormal liver function   . Altered mental status 01/31/2015  . Essential hypertension 01/31/2015  . Constipation 01/31/2015  . Hypothyroidism 01/31/2015  . Seizure disorder ([place]) 01/31/2015  . Bladder outlet obstruction 01/31/2015  . GERD (gastroesophageal reflux disease) 01/31/2015  . Chronic back pain 01/31/2015  . Acute kidney injury ([place]) 01/31/2015   Past Medical History:  Diagnosis Date  . Abnormality of gait 09/22/2015  . Arthritis   . Atrial fibrillation ([place])   . CAD (coronary artery disease)    Stent to RCA, Penta stent, 99% reduced to 0% 2002.  . Cancer ([place])    skin CA removed from back  . Chronic insomnia 03/30/2015  . Complication of anesthesia    trouble waking up  . GERD (gastroesophageal reflux disease)   . Hypercholesteremia   . Hypertension   . Hypothyroidism   . Memory difficulty 09/22/2015  . Osteoarthritis    . Seizures ([place])   . Sepsis ([place]) 05/2017  . Transient alteration of awareness 03/30/2015  . Vertigo    hx of    Family History  Problem Relation Age of Onset  . Hypertension Mother   . Cancer Mother   . Kidney failure Father   . Heart disease Father     Past Surgical History:  Procedure Laterality Date  . AMPUTATION Right 03/28/2018   Procedure: AMPUTATION BELOW KNEE;  Surgeon: Newt Minion, MD;  Location: [facility];  Service: Orthopedics;  Laterality: Right;  . BACK SURGERY    . EYE SURGERY     Bilateral Cataract surgery   . HERNIA REPAIR    . I&D EXTREMITY Right 05/10/2015   Procedure: IRRIGATION AND DEBRIDEMENT EXTREMITY;  Surgeon: Leanora Cover, MD;  Location: [facility];  Service:  Orthopedics;  Laterality: Right;  . KNEE ARTHROPLASTY     right knee X 2; left knee once  . LAMINECTOMY     X 6  . LEG AMPUTATION BELOW KNEE Right 03/28/2018  . POSTERIOR CERVICAL FUSION/FORAMINOTOMY  01/28/2012   Procedure: POSTERIOR CERVICAL FUSION/FORAMINOTOMY LEVEL 3;  Surgeon: [name], MD;  Location: [facility];  Service: Neurosurgery;  Laterality: Left;  Posterior Cervical Five-Thoracic One Fusion, Arthrodesis with LEFT Cervical Seven-thoracic One Laminectomy, Foraminotomy and Resection of Synovial Cyst  . POSTERIOR CERVICAL FUSION/FORAMINOTOMY N/A 01/29/2013   Procedure: POSTERIOR CERVICAL FUSION/FORAMINOTOMY LEVEL 1 and C2-5 Posteriolateral Arthrodesis;  Surgeon: [name], MD;  Location: [facility];  Service: Neurosurgery;  Laterality: N/A;  C2-C3 Laminectomy C2-C3 posterior cervical arthrodesis  . TONSILLECTOMY     Social History   Occupational History  . Occupation: retired Software engineer  Tobacco Use  . Smoking status: Former Research scientist (life sciences)  . Smokeless tobacco: Never Used  Substance and Sexual Activity  . Alcohol use: Not Currently    Comment: rare  . Drug use: No  . Sexual activity: Not Currently

## 2018-12-09 ENCOUNTER — Ambulatory Visit: Payer: Medicare Other | Admitting: Neurology

## 2018-12-09 ENCOUNTER — Inpatient Hospital Stay: Payer: Medicare Other

## 2018-12-20 ENCOUNTER — Emergency Department (HOSPITAL_COMMUNITY): Payer: Medicare Other

## 2018-12-20 ENCOUNTER — Other Ambulatory Visit: Payer: Self-pay

## 2018-12-20 ENCOUNTER — Emergency Department (HOSPITAL_COMMUNITY)
Admission: EM | Admit: 2018-12-20 | Discharge: 2018-12-20 | Disposition: A | Discharge status: Home / Self Care | Payer: Medicare Other | Attending: Emergency Medicine | Admitting: Emergency Medicine

## 2018-12-20 DIAGNOSIS — I10 Essential (primary) hypertension: Secondary | ICD-10-CM | POA: Insufficient documentation

## 2018-12-20 DIAGNOSIS — N3090 Cystitis, unspecified without hematuria: Secondary | ICD-10-CM

## 2018-12-20 DIAGNOSIS — E039 Hypothyroidism, unspecified: Secondary | ICD-10-CM | POA: Insufficient documentation

## 2018-12-20 DIAGNOSIS — Z89511 Acquired absence of right leg below knee: Secondary | ICD-10-CM | POA: Diagnosis not present

## 2018-12-20 DIAGNOSIS — Z85828 Personal history of other malignant neoplasm of skin: Secondary | ICD-10-CM | POA: Diagnosis not present

## 2018-12-20 DIAGNOSIS — R300 Dysuria: Secondary | ICD-10-CM | POA: Diagnosis present

## 2018-12-20 DIAGNOSIS — I110 Hypertensive heart disease with heart failure: Secondary | ICD-10-CM | POA: Insufficient documentation

## 2018-12-20 DIAGNOSIS — Z79899 Other long term (current) drug therapy: Secondary | ICD-10-CM | POA: Insufficient documentation

## 2018-12-20 DIAGNOSIS — I2510 Atherosclerotic heart disease of native coronary artery without angina pectoris: Secondary | ICD-10-CM | POA: Diagnosis not present

## 2018-12-20 DIAGNOSIS — I5032 Chronic diastolic (congestive) heart failure: Secondary | ICD-10-CM | POA: Diagnosis not present

## 2018-12-20 DIAGNOSIS — Z87891 Personal history of nicotine dependence: Secondary | ICD-10-CM | POA: Insufficient documentation

## 2018-12-20 LAB — COMPREHENSIVE METABOLIC PANEL
ALT: 16 U/L (ref 0–44)
AST: 20 U/L (ref 15–41)
Albumin: 3.6 g/dL (ref 3.5–5.0)
Alkaline Phosphatase: 39 U/L (ref 38–126)
Anion gap: 8 (ref 5–15)
BUN: 49 mg/dL — ABNORMAL HIGH (ref 8–23)
CO2: 29 mmol/L (ref 22–32)
Calcium: 8.9 mg/dL (ref 8.9–10.3)
Chloride: 107 mmol/L (ref 98–111)
Creatinine, Ser: 1.33 mg/dL — ABNORMAL HIGH (ref 0.61–1.24)
GFR calc Af Amer: 57 mL/min — ABNORMAL LOW (ref >60)
GFR calc non Af Amer: 49 mL/min — ABNORMAL LOW (ref >60)
Glucose, Bld: 106 mg/dL — ABNORMAL HIGH (ref 70–99)
Potassium: 4.9 mmol/L (ref 3.5–5.1)
Sodium: 144 mmol/L (ref 135–145)
Total Bilirubin: 0.9 mg/dL (ref 0.3–1.2)
Total Protein: 5.6 g/dL — ABNORMAL LOW (ref 6.5–8.1)

## 2018-12-20 LAB — CBC WITH DIFFERENTIAL/PLATELET
Abs Immature Granulocytes: 0.14 10*3/uL — ABNORMAL HIGH (ref 0.00–0.07)
Basophils Absolute: 0 10*3/uL (ref 0.0–0.1)
Basophils Relative: 0 %
Eosinophils Absolute: 0.1 10*3/uL (ref 0.0–0.5)
Eosinophils Relative: 1 %
HCT: 40 % (ref 39.0–52.0)
Hemoglobin: 12.5 g/dL — ABNORMAL LOW (ref 13.0–17.0)
Immature Granulocytes: 1 %
Lymphocytes Relative: 5 %
Lymphs Abs: 0.6 10*3/uL — ABNORMAL LOW (ref 0.7–4.0)
MCH: 32.9 pg (ref 26.0–34.0)
MCHC: 31.3 g/dL (ref 30.0–36.0)
MCV: 105.3 fL — ABNORMAL HIGH (ref 80.0–100.0)
Monocytes Absolute: 0.9 10*3/uL (ref 0.1–1.0)
Monocytes Relative: 8 %
Neutro Abs: 9.6 10*3/uL — ABNORMAL HIGH (ref 1.7–7.7)
Neutrophils Relative %: 85 %
Platelets: 132 10*3/uL — ABNORMAL LOW (ref 150–400)
RBC: 3.8 MIL/uL — ABNORMAL LOW (ref 4.22–5.81)
RDW: 14.6 % (ref 11.5–15.5)
WBC: 11.3 10*3/uL — ABNORMAL HIGH (ref 4.0–10.5)
nRBC: 0 % (ref 0.0–0.2)

## 2018-12-20 LAB — URINALYSIS, ROUTINE W REFLEX MICROSCOPIC
Bilirubin Urine: NEGATIVE
Glucose, UA: NEGATIVE mg/dL
Ketones, ur: NEGATIVE mg/dL
Nitrite: NEGATIVE
Protein, ur: 100 mg/dL — AB
RBC / HPF: >50 RBC/hpf — ABNORMAL HIGH (ref 0–5)
Specific Gravity, Urine: 1.01 (ref 1.005–1.030)
pH: 6 (ref 5.0–8.0)

## 2018-12-20 MED ORDER — CEPHALEXIN 500 MG PO CAPS: 500.0000 mg | ORAL_CAPSULE | Freq: Four times a day (QID) | ORAL | 0 refills | Status: DC

## 2018-12-20 MED ORDER — SODIUM CHLORIDE 0.9 % IV SOLN
1.0000 g | Freq: Once | INTRAVENOUS | Status: AC
  Administered 2018-12-20: 14:00:00 1 g via INTRAVENOUS
  Filled 2018-12-20: qty 10

## 2018-12-20 NOTE — ED Notes (Signed)
Patient verbalizes understanding of discharge instructions. Opportunity for questioning and answers were provided. Armband removed by staff, pt discharged from ED.  

## 2018-12-20 NOTE — ED Provider Notes (Signed)
[facility] Provider Note   CSN: [id number] Arrival date & time: 12/20/18  1129     History   Chief Complaint Chief Complaint  Patient presents with  . Urinary Tract Infection    HPI [name] is a 85 y.o. male with h/o CAD s/p stents, diastolic HF, chronic atrial fibrillation, chronic RLE wound s/p BKA, here for concern of UTI.  Reports history of one UTI in the past that felt similar.  Onset of dark urine, dysuria, frequent urination and mild lower abdominal burning with urination last night.  Also had a lot of "sputum" in his throat but no coughing. States he had to keep clearing his throat.  Took robitussin and sputum improved this morning.  No cough, sinus drainage, sore throat. No fever, chills, back or flank pain.  History of atrial fibrillation only on aspirin due to hemorrhage from right BKA stump.     HPI  Past Medical History:  Diagnosis Date  . Abnormality of gait 09/22/2015  . Arthritis   . Atrial fibrillation ([place])   . CAD (coronary artery disease)    Stent to RCA, Penta stent, 99% reduced to 0% 2002.  . Cancer ([place])    skin CA removed from back  . Chronic insomnia 03/30/2015  . Complication of anesthesia    trouble waking up  . GERD (gastroesophageal reflux disease)   . Hypercholesteremia   . Hypertension   . Hypothyroidism   . Memory difficulty 09/22/2015  . Osteoarthritis   . Seizures ([place])   . Sepsis ([place]) 05/2017  . Transient alteration of awareness 03/30/2015  . Vertigo    hx of    Patient Active Problem List   Diagnosis Date Noted  . Wound infection 05/09/2018  . AKI (acute kidney injury) ([place])   . S/P BKA (below knee amputation) unilateral, right ([place])   . Unilateral complete BKA, right, initial encounter ([place])   . Tachypnea   . Post-operative pain   . Subacute osteomyelitis, right ankle and foot ([place])   . Paralysis of right lower extremity ([place])   . TIA (transient ischemic attack) 03/24/2018  .  Encephalopathy 03/24/2018  . Cellulitis 03/15/2018  . Hypernatremia 03/15/2018  . Persistent atrial fibrillation ([place]) 11/10/2017  . Severe muscle deconditioning 11/10/2017  . Hemorrhage 10/22/2017  . CAD S/P percutaneous coronary angioplasty 10/22/2017  . Acute hypoxemic respiratory failure ([place]) 07/17/2017  . Aspiration syndrome, subsequent encounter 07/11/2017  . Aspiration pneumonia ([place]) 07/01/2017  . Acute encephalopathy 07/01/2017  . Thrombocytopenia ([place]) 07/01/2017  . Chronic diastolic (congestive) heart failure ([place]) 07/01/2017  . Anemia of chronic disease 07/01/2017  . Troponin level elevated 07/01/2017  . Chronic atrial fibrillation 07/01/2017  . Goals of care, counseling/discussion   . Palliative care by specialist   . Acute metabolic encephalopathy [id number]  . CKD (chronic kidney disease) stage 3, GFR 30-59 ml/min   . CAP (community acquired pneumonia) 04/17/2017  . Hallux rigidus, right foot 03/10/2016  . Lumbosacral spondylosis without myelopathy 10/29/2015  . Memory difficulty 09/22/2015  . Abnormality of gait 09/22/2015  . Hyperglycemia 07/06/2015  . Chronic pain 07/06/2015  . Chronic insomnia 03/30/2015  . Transient alteration of awareness 03/30/2015  . Abnormal liver function   . Altered mental status 01/31/2015  . Essential hypertension 01/31/2015  . Constipation 01/31/2015  . Hypothyroidism 01/31/2015  . Seizure disorder ([place]) 01/31/2015  . Bladder outlet obstruction 01/31/2015  . GERD (gastroesophageal reflux disease) 01/31/2015  . Chronic back pain 01/31/2015  .  Acute kidney injury ([place]) 01/31/2015    Past Surgical History:  Procedure Laterality Date  . AMPUTATION Right 03/28/2018   Procedure: AMPUTATION BELOW KNEE;  Surgeon: Newt Minion, MD;  Location: [facility];  Service: Orthopedics;  Laterality: Right;  . BACK SURGERY    . EYE SURGERY     Bilateral Cataract surgery   . HERNIA REPAIR    . I&D EXTREMITY Right 05/10/2015   Procedure:  IRRIGATION AND DEBRIDEMENT EXTREMITY;  Surgeon: Leanora Cover, MD;  Location: [facility];  Service: Orthopedics;  Laterality: Right;  . KNEE ARTHROPLASTY     right knee X 2; left knee once  . LAMINECTOMY     X 6  . LEG AMPUTATION BELOW KNEE Right 03/28/2018  . POSTERIOR CERVICAL FUSION/FORAMINOTOMY  01/28/2012   Procedure: POSTERIOR CERVICAL FUSION/FORAMINOTOMY LEVEL 3;  Surgeon: [name], MD;  Location: [facility];  Service: Neurosurgery;  Laterality: Left;  Posterior Cervical Five-Thoracic One Fusion, Arthrodesis with LEFT Cervical Seven-thoracic One Laminectomy, Foraminotomy and Resection of Synovial Cyst  . POSTERIOR CERVICAL FUSION/FORAMINOTOMY N/A 01/29/2013   Procedure: POSTERIOR CERVICAL FUSION/FORAMINOTOMY LEVEL 1 and C2-5 Posteriolateral Arthrodesis;  Surgeon: [name], MD;  Location: [facility];  Service: Neurosurgery;  Laterality: N/A;  C2-C3 Laminectomy C2-C3 posterior cervical arthrodesis  . TONSILLECTOMY          Home Medications    Prior to Admission medications   Medication Sig Start Date End Date Taking? Authorizing Provider  acetaminophen (TYLENOL) 650 MG CR tablet Take 650 mg by mouth every 8 (eight) hours as needed for pain.    [provider]  alendronate (FOSAMAX) 70 MG tablet Take 70 mg by mouth every Wednesday. Remain upright for 30-60 minutes. 01/06/12   [provider]  Amino Acids-Protein Hydrolys (FEEDING SUPPLEMENT, PRO-STAT SUGAR FREE 64,) LIQD Take 30 mLs by mouth 3 (three) times daily with meals.    [provider]  aspirin EC 81 MG tablet Take 81 mg by mouth daily.    [provider]  calcium carbonate (OSCAL) 1500 (600 Ca) MG TABS tablet Take 600 mg of elemental calcium by mouth daily after breakfast.    [provider]  cephALEXin (KEFLEX) 500 MG capsule Take 1 capsule (500 mg total) by mouth 4 (four) times daily. 12/20/18   Kinnie Feil, PA-C  Cholecalciferol (VITAMIN  D) 125 MCG (5000 UT) CAPS Take 1 capsule by mouth daily.    [provider]  cyanocobalamin 500 MCG tablet Take 500 mcg by mouth at bedtime. Vitamin B12    [provider]  diclofenac sodium (VOLTAREN) 1 % GEL Apply 2 g topically daily as needed (pain).    [provider]  diltiazem (CARDIZEM CD) 240 MG 24 hr capsule Take 240 mg every morning   Hold if systolic blood pressure is less than 100. 06/19/18   Croitoru, Mihai, MD  divalproex (DEPAKOTE) 250 MG DR tablet Take 250-500 mg by mouth See admin instructions. Take one tablet (250 mg) by mouth every morning and two tablets (500 mg) every evening (for epilepsy)    [provider]  doxazosin (CARDURA) 4 MG tablet Take 4 mg by mouth at bedtime.     [provider]  furosemide (LASIX) 20 MG tablet Take 1 tablet (20 mg total) by mouth daily. 07/24/18 10/22/18  Croitoru, Mihai, MD  HYDROcodone-acetaminophen (NORCO) 10-325 MG tablet Take 1 tablet by mouth every 4 (four) hours as needed for moderate pain.  [provider]  Lacosamide (VIMPAT) 100 MG TABS Take 1 tablet (100 mg total) by mouth 2 (two) times a day. 08/27/18   Cameron Sprang, MD  lacosamide (VIMPAT) 50 MG TABS tablet Take 25mg  twice a day for a week, then increase to 50 mg  Twice a day for 1 week then increase to 75mg  twice a day for 1 week then increase to 100 mg twice a day 08/27/18   Cameron Sprang, MD  levothyroxine (SYNTHROID, LEVOTHROID) 150 MCG tablet Take 150 mcg by mouth daily before breakfast. 12/27/11   [provider]  Lidocaine (ASPERCREME LIDOCAINE) 4 % PTCH Apply 1 patch topically 2 (two) times daily as needed.     [provider]  metoprolol tartrate (LOPRESSOR) 25 MG tablet TAKE 1 TABLET BY MOUTH TWICE DAILY WITH A MEAL OR IMMEDIATELY AFTER 11/10/18   Croitoru, Mihai, MD  Multiple Vitamins-Minerals (CERTAVITE/ANTIOXIDANTS) TABS Take 1 tablet by mouth every evening. With Iron 18 mg/ folic acid [id number] mcg    [provider]  mupirocin ointment (BACTROBAN) 2 % Apply 1 application topically daily. Apply to right lateral leg wound daily 09/11/18   Rayburn, Neta Mends, PA-C  nitroGLYCERIN (NITROSTAT) 0.4 MG SL tablet Place 1 tablet (0.4 mg total) under the tongue every 5 (five) minutes as needed for chest pain (call 911 afer 3 doses and if chest pain persists). 06/19/18   Croitoru, Mihai, MD  omeprazole (PRILOSEC) 20 MG capsule Take 20 mg by mouth daily before breakfast.  12/23/11   [provider]  potassium chloride SA (K-DUR) 20 MEQ tablet Take 1/2 tablet ( 10 mEq ) by mouth daily 06/26/18   Croitoru, Mihai, MD  pravastatin (PRAVACHOL) 40 MG tablet Take 40 mg by mouth every evening.  10/29/14   [provider]  predniSONE (DELTASONE) 5 MG tablet Take 2 tablets (10 mg total) by mouth daily with breakfast. 04/02/18   Debbe Odea, MD  silver sulfADIAZINE (SILVADENE) 1 % cream Apply 1 application topically daily. Apply to affected area daily plus dry dressing 10/07/18   Newt Minion, MD    Family History Family History  Problem Relation Age of Onset  . Hypertension Mother   . Cancer Mother   . Kidney failure Father   . Heart disease Father     Social History Social History   Tobacco Use  . Smoking status: Former Research scientist (life sciences)  . Smokeless tobacco: Never Used  Substance Use Topics  . Alcohol use: Not Currently    Comment: rare  . Drug use: No     Allergies   Eliquis [apixaban], Demerol [meperidine], Keppra [levetiracetam], and Other   Review of Systems Review of Systems  HENT:       Sputum   Gastrointestinal: Positive for abdominal pain.  Genitourinary: Positive for difficulty urinating, dysuria, frequency and hematuria.  All other systems reviewed and are negative.    Physical Exam Updated Vital Signs BP 114/65   Pulse 66   Temp (!) 97.5 F (36.4 C) (Oral)   Resp 10   Ht 5\' 9"  (1.753 m)   Wt 68 kg   SpO2 99%   BMI 22.15 kg/m   Physical Exam Vitals signs  and nursing note reviewed.  Constitutional:      General: He is not in acute distress.    Appearance: He is well-developed.     Comments: NAD.  HENT:     Head: Normocephalic and atraumatic.     Right Ear: External ear normal.  Left Ear: External ear normal.     Nose: Nose normal.     Mouth/Throat:     Comments: MMM. Oropharynx and tonsils normal  Eyes:     Conjunctiva/sclera: Conjunctivae normal.  Neck:     Musculoskeletal: Normal range of motion and neck supple.  Cardiovascular:     Rate and Rhythm: Normal rate and regular rhythm.     Heart sounds: Normal heart sounds. No murmur.  Pulmonary:     Effort: Pulmonary effort is normal.     Breath sounds: Normal breath sounds.  Abdominal:     General: Abdomen is flat.     Palpations: Abdomen is soft.     Tenderness: There is no abdominal tenderness.     Comments: No suprapubic or CVA tenderness.  Genitourinary:    Comments: External male genitalia including urethra normal Musculoskeletal: Normal range of motion.     Comments: S/p right BKA  Skin:    General: Skin is warm and dry.     Capillary Refill: Capillary refill takes less than 2 seconds.  Neurological:     Mental Status: He is alert and oriented to person, place, and time.  Psychiatric:        Behavior: Behavior normal.        Thought Content: Thought content normal.        Judgment: Judgment normal.      ED Treatments / Results  Labs (all labs ordered are listed, but only abnormal results are displayed) Labs Reviewed  CBC WITH DIFFERENTIAL/PLATELET - Abnormal; Notable for the following components:      Result Value   WBC 11.3 (*)    RBC 3.80 (*)    Hemoglobin 12.5 (*)    MCV 105.3 (*)    Platelets 132 (*)    Neutro Abs 9.6 (*)    Lymphs Abs 0.6 (*)    Abs Immature Granulocytes 0.14 (*)    All other components within normal limits  COMPREHENSIVE METABOLIC PANEL - Abnormal; Notable for the following components:   Glucose, Bld 106 (*)    BUN 49 (*)     Creatinine, Ser 1.33 (*)    Total Protein 5.6 (*)    GFR calc non Af Amer 49 (*)    GFR calc Af Amer 57 (*)    All other components within normal limits  URINALYSIS, ROUTINE W REFLEX MICROSCOPIC - Abnormal; Notable for the following components:   Color, Urine AMBER (*)    APPearance HAZY (*)    Hgb urine dipstick LARGE (*)    Protein, ur 100 (*)    Leukocytes,Ua LARGE (*)    RBC / HPF >50 (*)    Bacteria, UA RARE (*)    All other components within normal limits  URINE CULTURE    EKG None  Radiology Dg Chest Portable 1 View  Result Date: 12/20/2018 CLINICAL DATA:  Fever, sputum in throat EXAM: PORTABLE CHEST 1 VIEW COMPARISON:  05/07/2018 FINDINGS: The heart size and mediastinal contours are stable. Lung volumes are low. Mild bibasilar and left perihilar linear atelectasis. No focal airspace consolidation. No large pleural fluid collection. No pneumothorax. IMPRESSION: Low lung volumes.  No acute cardiopulmonary findings. Electronically Signed   By: Davina Poke M.D.   On: 12/20/2018 12:16    Procedures Procedures (including critical care time)  Medications Ordered in ED Medications  cefTRIAXone (ROCEPHIN) 1 g in sodium chloride 0.9 % 100 mL IVPB (1 g Intravenous New Bag/Given 12/20/18 1345)     Initial  Impression / Assessment and Plan / ED Course  I have reviewed the triage vital signs and the nursing notes.  Pertinent labs & imaging results that were available during my care of the patient were reviewed by me and considered in my medical decision making (see chart for details).  Clinical Course as of Dec 19 1505  Sat Dec 20, 2018  1314 Leukocytes,Ua(!): LARGE [CG]  1315 RBC / HPF(!): >50 [CG]  1315 WBC, UA: 21-50 [CG]  1315 Bacteria, UA(!): RARE [CG]  1315 Squamous Epithelial / LPF: 0-5 [CG]  1315 Low lung volumes.  No acute cardiopulmonary findings.  DG Chest Portable 1 View [CG]  1315 WBC(!): 11.3 [CG]  1315 Hemoglobin(!): 12.5 [CG]  1326 Creatinine(!): 1.33  [CG]  1327 GFR, Est Non African American(!): 49 [CG]    Clinical Course User Index [CG] Kinnie Feil, PA-C   Patient's EMR reviewed.  Seen in the ER 2 months ago for similar presentation with UTI at that grew greater than 100 K E. coli susceptible to ceftriaxone/cephalosporins.  Highest on DDX is hemorrhagic cystitis.  No constitutional symptoms, suprapubic or CVA tenderness on exam.  He is voiding in the ER and has no symptoms to suggest retention or issues with the prostate.  He is not on blood thinners.  No history of kidney stones in the past and he has no pain to suggest this.  ER work-up reviewed by me shows mild leukocytosis, stable minimal anemia, stable creatinine/GFR.  UA is consistent with infection.  Reported some sputum and congestion with his throat but no cough.  We obtained a chest x-ray that did not show any infiltrates or other abnormalities.  Throat exam here is normal.  No s/s to suggest SIRS/sepsis.  We will give ceftriaxone here and send urine for culture.  Will DC with Keflex, oral hydration.  Return precautions discussed.  Patient is comfortable with this.  1505: Wife noticed redness in sacral skin. I evaluated this area, he has minimal erythema and no tenderness to sacral skin, no lesions or wounds.  Recommended observation, good hygiene, pcp f/u.  Final Clinical Impressions(s) / ED Diagnoses   Final diagnoses:  Cystitis    ED Discharge Orders         Ordered    cephALEXin (KEFLEX) 500 MG capsule  4 times daily     12/20/18 1331           Kinnie Feil, [place] 12/20/18 1507    Veryl Speak, MD 12/20/18 1615

## 2018-12-20 NOTE — ED Triage Notes (Signed)
Pt BIB [facility] for a possible UTI. Per EMS reports that patient's PCP thinks he has a UTI but the patient has been having difficulty urinating and darkening urine color over the last 24 hours. EMS reports patient had a sample they saw that looked dark red in color. Pt is alert and oriented x4 denying any abdominal pain.   EMS Vitals   HR 68 BP 134/80 SpO2 98% CBG 121 RR 20

## 2018-12-20 NOTE — Discharge Instructions (Addendum)
You were seen in the ER for urination issues  Urine confirmed infection  Urine culture is pending to determine what bug/bacteria is causing the infection  Take keflex. Last urine culture two months ago showed infection at that time was from [facility] which is susceptible to Keflex.  Urine cultures are pending to determine exact bug, if a different antibiotic is necessary pharmacy will contact you.  Stay hydrated. Use tylenol as needed for pain.  Return for fever greater than 100, vomiting, unable to urinate, lower abdomen or flank pain  You may follow up with urology as needed

## 2018-12-22 LAB — URINE CULTURE: Culture: >=100000 — AB

## 2018-12-23 ENCOUNTER — Telehealth: Payer: Self-pay | Admitting: *Deleted

## 2018-12-23 NOTE — Telephone Encounter (Signed)
Post ED Visit - Positive Culture Follow-up: Successful Patient Follow-Up  Culture assessed and recommendations reviewed by:  []  Elenor Quinones, Pharm.D. []  Heide Guile, Pharm.D., BCPS AQ-ID []  Parks Neptune, Pharm.D., BCPS []  Alycia Rossetti, Pharm.D., BCPS []  [place], Pharm.D., BCPS, AAHIVP []  Legrand Como, Pharm.D., BCPS, AAHIVP []  Salome Arnt, PharmD, BCPS []  Johnnette Gourd, PharmD, BCPS []  Hughes Better, PharmD, BCPS []  Leeroy Cha, PharmD  Positive urine culture  []  Patient discharged without antimicrobial prescription and treatment is now indicated [x]  Organism is resistant to prescribed ED discharge antimicrobial []  Patient with positive blood cultures  Changes discussed with ED provider: Lenn Sink, PA New antibiotic prescription Bactrim DS 1 tab PO BID x 7 days Called to [facility] [phone]  Contacted patient, date 12/23/2018, time [place] 12/23/2018, 11:18 AM

## 2018-12-23 NOTE — Progress Notes (Addendum)
ED Antimicrobial Stewardship Positive Culture Follow Up   [name] is an 85 y.o. male who presented to [facility] on 12/20/2018 with a chief complaint of dark urine, dysuria, frequent urination.  Chief Complaint  Patient presents with  . Urinary Tract Infection    Recent Results (from the past 720 hour(s))  Urine culture     Status: Abnormal   Collection Time: 12/20/18 11:55 AM   Specimen: Urine, Random  Result Value Ref Range Status   Specimen Description URINE, RANDOM  Final   Special Requests   Final    NONE Performed at [facility], [address] [place], [place] [address]    Culture >=100,000 COLONIES/mL ESCHERICHIA COLI (A)  Final   Report Status 12/22/2018 FINAL  Final   Organism ID, Bacteria ESCHERICHIA COLI (A)  Final      Susceptibility   Escherichia coli - MIC*    AMPICILLIN >=32 RESISTANT Resistant     CEFAZOLIN >=64 RESISTANT Resistant     CEFTRIAXONE <=1 SENSITIVE Sensitive     CIPROFLOXACIN <=0.25 SENSITIVE Sensitive     GENTAMICIN <=1 SENSITIVE Sensitive     IMIPENEM <=0.25 SENSITIVE Sensitive     NITROFURANTOIN <=16 SENSITIVE Sensitive     TRIMETH/SULFA <=20 SENSITIVE Sensitive     AMPICILLIN/SULBACTAM >=32 RESISTANT Resistant     PIP/TAZO 64 INTERMEDIATE Intermediate     Extended ESBL NEGATIVE Sensitive     * >=100,000 COLONIES/mL ESCHERICHIA COLI    [x]  Treated with cephalexin, organism resistant to prescribed antimicrobial  New antibiotic prescription: Bactrim DS 1 tablet by mouth 2 (two) times a day for 7 days.  ED Provider: Lenn Sink   Veva Grimley L Panzy Bubeck 12/23/2018, 10:46 AM Clinical Pharmacist Monday - Friday phone -  [phone] Saturday - Sunday phone - [phone]

## 2018-12-27 ENCOUNTER — Telehealth: Payer: Self-pay | Admitting: Cardiology

## 2018-12-27 NOTE — Telephone Encounter (Signed)
Pt having a lot of constipation, his wife called after 1 fleets saline enema with minimal results and she wanted to make sure a second saline fleets was ok,  She will try the second and if no response she may take to ER for possible impaction.

## 2019-01-05 ENCOUNTER — Ambulatory Visit: Payer: Medicare Other | Admitting: Orthopedic Surgery

## 2019-01-08 ENCOUNTER — Ambulatory Visit: Payer: Medicare Other | Admitting: Orthopedic Surgery

## 2019-01-19 ENCOUNTER — Encounter: Payer: Self-pay | Admitting: Orthopedic Surgery

## 2019-01-19 ENCOUNTER — Ambulatory Visit (INDEPENDENT_AMBULATORY_CARE_PROVIDER_SITE_OTHER): Payer: Medicare Other | Admitting: Orthopedic Surgery

## 2019-01-19 VITALS — Ht 69.0 in | Wt 150.0 lb

## 2019-01-19 DIAGNOSIS — T8781 Dehiscence of amputation stump: Secondary | ICD-10-CM | POA: Diagnosis not present

## 2019-01-19 DIAGNOSIS — I2510 Atherosclerotic heart disease of native coronary artery without angina pectoris: Secondary | ICD-10-CM | POA: Diagnosis not present

## 2019-01-19 DIAGNOSIS — Z89511 Acquired absence of right leg below knee: Secondary | ICD-10-CM

## 2019-01-19 DIAGNOSIS — Z9861 Coronary angioplasty status: Secondary | ICD-10-CM

## 2019-01-20 ENCOUNTER — Encounter: Payer: Self-pay | Admitting: Orthopedic Surgery

## 2019-01-20 NOTE — Progress Notes (Addendum)
Office Visit Note   Patient: [name]           Date of Birth: 1934-03-05           MRN: [id number] Visit Date: 01/19/2019              Requested by: [name], [place],   [id number] PCP: [name], MD  Chief Complaint  Patient presents with  . Right Leg - Follow-up    03/28/2018 Right BKA      HPI: Patient is a 85 year old gentleman status post right transtibial amputation 10 months ago.  Patient states that the wounds are smaller but still open and draining.  Patient has been scheduled in the past for revision surgery and he canceled the surgery.  Patient has a second urinary tract infection in a short period of time he is currently on sulfamethoxazole trimethoprim.  Patient was recently seen by Dr. Berenice Primas for a second opinion.  Assessment & Plan: Visit Diagnoses:  1. S/P BKA (below knee amputation) unilateral, right ([place])   2. Dehiscence of amputation stump (H[facility])     Plan: Discussed with the patient the fact that these wounds are open and draining and present for 10 months the ulcers are most likely being driven by underlying osteomyelitis.  Again have recommended proceeding with revision amputation of the right transtibial amputation.  Risks and benefits were discussed including potential for the wound not healing need for additional surgery.  Patient states he understands he would still like to think about his options I will have my office call him tomorrow.  Follow-Up Instructions: Return in about 2 weeks (around 02/02/2019).   Ortho Exam  Patient is alert, oriented, no adenopathy, well-dressed, normal affect, normal respiratory effort. Examination the lateral wound has no tunneling it is 1 cm in diameter there is epiboly with rolled up edges with clear drainage there is no undermining no tunneling.  The anterior wound is 5 mm in diameter and probes down to fascia over the tibia.  There is persistent clear drainage there is no  ascending cellulitis.  Patient's radiographs were reviewed from Dr. Berenice Primas office which shows a revision total knee arthroplasty with approximately 1 cm of bone in the tibia distal to the stemmed tibial prosthesis.  Imaging: No results found. No images are attached to the encounter.  Labs: Lab Results  Component Value Date   HGBA1C 5.7 (H) 03/26/2018   HGBA1C 5.0 07/12/2017   HGBA1C 5.8 (H) 07/06/2015   ESRSEDRATE 24 (H) 03/24/2018   ESRSEDRATE 15 04/18/2017   ESRSEDRATE 5 09/17/2007   CRP 3.1 (H) 05/11/2018   CRP 1.8 (H) 03/24/2018   CRP <0.8 04/24/2017   LABURIC 6.0 03/09/2016   REPTSTATUS 12/22/2018 FINAL 12/20/2018   GRAMSTAIN  07/15/2017    WBC PRESENT,BOTH PMN AND MONONUCLEAR NO ORGANISMS SEEN Performed at [facility], [address], [place], [place] [address]    CULT >=100,000 COLONIES/mL ESCHERICHIA COLI (A) 12/20/2018   LABORGA ESCHERICHIA COLI (A) 12/20/2018     Lab Results  Component Value Date   ALBUMIN 3.6 12/20/2018   ALBUMIN 3.0 (L) 05/09/2018   ALBUMIN 2.5 (L) 03/31/2018   PREALBUMIN 36 (H) 11/22/2017   PREALBUMIN 28.8 04/24/2017   LABURIC 6.0 03/09/2016    Lab Results  Component Value Date   MG 2.0 05/12/2018   MG 1.9 05/11/2018   MG 1.9 03/31/2018   No results found for: [facility]  Lab Results  Component Value Date  PREALBUMIN 36 (H) 11/22/2017   PREALBUMIN 28.8 04/24/2017   CBC EXTENDED Latest Ref Rng & Units 12/20/2018 11/08/2018 05/12/2018  WBC 4.0 - 10.5 K/uL 11.3(H) 9.6 6.2  RBC 4.22 - 5.81 MIL/uL 3.80(L) 3.29(L) 2.72(L)  HGB 13.0 - 17.0 g/dL 12.5(L) 10.9(L) 8.2(L)  HCT 39.0 - 52.0 % 40.0 34.6(L) 26.4(L)  PLT 150 - 400 K/uL 132(L) 128(L) 186  NEUTROABS 1.7 - 7.7 K/uL 9.6(H) 7.8(H) 4.0  LYMPHSABS 0.7 - 4.0 K/uL 0.6(L) 0.7 1.1     Body mass index is 22.15 kg/m.  Orders:  No orders of the defined types were placed in this encounter.  No orders of the defined types were placed in this encounter.    Procedures: No  procedures performed  Clinical Data: No additional findings.  ROS:  All other systems negative, except as noted in the HPI. Review of Systems  Objective: Vital Signs: Ht 5\' 9"  (1.753 m)   Wt 150 lb (68 kg)   BMI 22.15 kg/m   Specialty Comments:  No specialty comments available.  PMFS History: Patient Active Problem List   Diagnosis Date Noted  . Wound infection 05/09/2018  . AKI (acute kidney injury) ([place])   . S/P BKA (below knee amputation) unilateral, right ([place])   . Unilateral complete BKA, right, initial encounter ([place])   . Tachypnea   . Post-operative pain   . Subacute osteomyelitis, right ankle and foot ([place])   . Paralysis of right lower extremity ([place])   . TIA (transient ischemic attack) 03/24/2018  . Encephalopathy 03/24/2018  . Cellulitis 03/15/2018  . Hypernatremia 03/15/2018  . Persistent atrial fibrillation ([place]) 11/10/2017  . Severe muscle deconditioning 11/10/2017  . Hemorrhage 10/22/2017  . CAD S/P percutaneous coronary angioplasty 10/22/2017  . Acute hypoxemic respiratory failure ([place]) 07/17/2017  . Aspiration syndrome, subsequent encounter 07/11/2017  . Aspiration pneumonia ([place]) 07/01/2017  . Acute encephalopathy 07/01/2017  . Thrombocytopenia ([place]) 07/01/2017  . Chronic diastolic (congestive) heart failure ([place]) 07/01/2017  . Anemia of chronic disease 07/01/2017  . Troponin level elevated 07/01/2017  . Chronic atrial fibrillation 07/01/2017  . Goals of care, counseling/discussion   . Palliative care by specialist   . Acute metabolic encephalopathy [id number]  . CKD (chronic kidney disease) stage 3, GFR 30-59 ml/min   . CAP (community acquired pneumonia) 04/17/2017  . Hallux rigidus, right foot 03/10/2016  . Lumbosacral spondylosis without myelopathy 10/29/2015  . Memory difficulty 09/22/2015  . Abnormality of gait 09/22/2015  . Hyperglycemia 07/06/2015  . Chronic pain 07/06/2015  . Chronic insomnia 03/30/2015  . Transient alteration of  awareness 03/30/2015  . Abnormal liver function   . Altered mental status 01/31/2015  . Essential hypertension 01/31/2015  . Constipation 01/31/2015  . Hypothyroidism 01/31/2015  . Seizure disorder ([place]) 01/31/2015  . Bladder outlet obstruction 01/31/2015  . GERD (gastroesophageal reflux disease) 01/31/2015  . Chronic back pain 01/31/2015  . Acute kidney injury ([place]) 01/31/2015   Past Medical History:  Diagnosis Date  . Abnormality of gait 09/22/2015  . Arthritis   . Atrial fibrillation ([place])   . CAD (coronary artery disease)    Stent to RCA, Penta stent, 99% reduced to 0% 2002.  . Cancer ([place])    skin CA removed from back  . Chronic insomnia 03/30/2015  . Complication of anesthesia    trouble waking up  . GERD (gastroesophageal reflux disease)   . Hypercholesteremia   . Hypertension   . Hypothyroidism   . Memory difficulty 09/22/2015  . Osteoarthritis   .  Seizures ([place])   . Sepsis ([place]) 05/2017  . Transient alteration of awareness 03/30/2015  . Vertigo    hx of    Family History  Problem Relation Age of Onset  . Hypertension Mother   . Cancer Mother   . Kidney failure Father   . Heart disease Father     Past Surgical History:  Procedure Laterality Date  . AMPUTATION Right 03/28/2018   Procedure: AMPUTATION BELOW KNEE;  Surgeon: Newt Minion, MD;  Location: [facility];  Service: Orthopedics;  Laterality: Right;  . BACK SURGERY    . EYE SURGERY     Bilateral Cataract surgery   . HERNIA REPAIR    . I&D EXTREMITY Right 05/10/2015   Procedure: IRRIGATION AND DEBRIDEMENT EXTREMITY;  Surgeon: Leanora Cover, MD;  Location: [facility];  Service: Orthopedics;  Laterality: Right;  . KNEE ARTHROPLASTY     right knee X 2; left knee once  . LAMINECTOMY     X 6  . LEG AMPUTATION BELOW KNEE Right 03/28/2018  . POSTERIOR CERVICAL FUSION/FORAMINOTOMY  01/28/2012   Procedure: POSTERIOR CERVICAL FUSION/FORAMINOTOMY LEVEL 3;  Surgeon: [name], MD;  Location: [facility];  Service: Neurosurgery;  Laterality: Left;  Posterior Cervical Five-Thoracic One Fusion, Arthrodesis with LEFT Cervical Seven-thoracic One Laminectomy, Foraminotomy and Resection of Synovial Cyst  . POSTERIOR CERVICAL FUSION/FORAMINOTOMY N/A 01/29/2013   Procedure: POSTERIOR CERVICAL FUSION/FORAMINOTOMY LEVEL 1 and C2-5 Posteriolateral Arthrodesis;  Surgeon: [name], MD;  Location: [facility];  Service: Neurosurgery;  Laterality: N/A;  C2-C3 Laminectomy C2-C3 posterior cervical arthrodesis  . TONSILLECTOMY     Social History   Occupational History  . Occupation: retired Software engineer  Tobacco Use  . Smoking status: Former Research scientist (life sciences)  . Smokeless tobacco: Never Used  Substance and Sexual Activity  . Alcohol use: Not Currently    Comment: rare  . Drug use: No  . Sexual activity: Not Currently

## 2019-01-21 ENCOUNTER — Telehealth: Payer: Self-pay | Admitting: Orthopedic Surgery

## 2019-01-21 NOTE — Telephone Encounter (Signed)
Patient's wife [name] called asked if patient should take the Trimethoprim 100mg  tab the day of the surgery. The number to contact [name] is [phone] or [phone] cell

## 2019-01-21 NOTE — Telephone Encounter (Signed)
rx sent

## 2019-01-21 NOTE — Telephone Encounter (Signed)
Dr Duda please advise, thank you. 

## 2019-01-22 ENCOUNTER — Encounter (HOSPITAL_COMMUNITY): Payer: Self-pay | Admitting: *Deleted

## 2019-01-22 ENCOUNTER — Other Ambulatory Visit: Payer: Self-pay

## 2019-01-22 ENCOUNTER — Other Ambulatory Visit: Payer: Self-pay | Admitting: Physician Assistant

## 2019-01-22 ENCOUNTER — Other Ambulatory Visit (HOSPITAL_COMMUNITY)
Admission: RE | Admit: 2019-01-22 | Discharge: 2019-01-22 | Disposition: A | Discharge status: Home / Self Care | Payer: Medicare Other | Source: Ambulatory Visit | Attending: Orthopedic Surgery | Admitting: Orthopedic Surgery

## 2019-01-22 DIAGNOSIS — Z01812 Encounter for preprocedural laboratory examination: Secondary | ICD-10-CM | POA: Insufficient documentation

## 2019-01-22 DIAGNOSIS — Z20828 Contact with and (suspected) exposure to other viral communicable diseases: Secondary | ICD-10-CM | POA: Diagnosis not present

## 2019-01-22 LAB — SARS CORONAVIRUS 2 (TAT 6-24 HRS): SARS Coronavirus 2: NEGATIVE

## 2019-01-22 NOTE — Progress Notes (Signed)
Patient and wife verbalized understanding of instructions,  Patients wife had multiple questions that were answered, at the end of the call all questions were answered.    LD 11/12

## 2019-01-22 NOTE — Progress Notes (Signed)
Anesthesia Chart Review: Same day workup  Follows with cardiology for hx of HFpEF, persistent Afib (not on anticoag due to recurrent bleeding complications), CAD s/p PCI to RCA in 2002. Last seen by Dr. Sallyanne Kuster 10/21/18, per note "He denies palpitations or syncope.  Overall feels that he is doing well.  He has lost some real weight.  Appears euvolemic at today's weight of 154 pounds." It was also discussed that he would likely need stump revision in the near future due to nonhealing wounds.   Dr Croitoru's note also mentions pt is on chronic steroid therapy for "arthritis".  He has not been able to wean dose in many years. This makes him very prone to sodium retention and hypervolemia. He also needs to be aware that he will require "stress doses of hydrocortisone" for any serious illnesses or major surgeries.  Follows with neurology for history of seizures. History of recurrent staring spells and had a witnessed convulsion in 2017. EEG in 2017 reported left hemisphere epileptiform discharges. He was admitted in January 2020 for altered mental status with report of staring, prior to this the last staring spell was in 2018. No further seizures since Jan 2020. He is on low dose depakote (subtherapeutic per neurology notes, pt unable to tolerate higher dose).  CKD 3, baseline creatinine ~1.3.  Pt will need DOS labs and eval.  EKG 05/09/18: Afib. Vent rate 90.   TTE 03/25/18: - Left ventricle: The cavity size was normal. Wall thickness was   increased in a pattern of moderate LVH. Systolic function was   normal. The estimated ejection fraction was in the range of 55%   to 60%. Left ventricular diastolic function parameters were   normal. - Aortic valve: There was mild regurgitation. - Mitral valve: Calcified annulus. Mildly thickened leaflets .   There was mild regurgitation. Valve area by pressure half-time:   1.04 cm^2. - Left atrium: The atrium was mildly dilated. - Atrial septum: No defect or  patent foramen ovale was identified.   Wynonia Musty [facility]/Anesthesiology Phone [phone] 01/22/2019 4:10 PM

## 2019-01-22 NOTE — Anesthesia Preprocedure Evaluation (Addendum)
Anesthesia Evaluation  Patient identified by MRN, date of birth, ID band Patient awake    Reviewed: Allergy & Precautions, Patient's Chart, lab work & pertinent test results, Unable to perform ROS - Chart review only  Airway Mallampati: II  TM Distance: >3 FB Neck ROM: Full    Dental no notable dental hx. (+) Teeth Intact   Pulmonary pneumonia, former smoker,    Pulmonary exam normal breath sounds clear to auscultation       Cardiovascular hypertension, Pt. on medications + CAD and +CHF  Normal cardiovascular exam Rhythm:Regular Rate:Normal     Neuro/Psych TIAnegative psych ROS   GI/Hepatic Neg liver ROS, GERD  ,  Endo/Other    Renal/GU      Musculoskeletal   Abdominal   Peds  Hematology  (+) Blood dyscrasia, anemia ,   Anesthesia Other Findings   Reproductive/Obstetrics                           Anesthesia Physical Anesthesia Plan  ASA: III  Anesthesia Plan: General   Post-op Pain Management:    Induction: Intravenous  PONV Risk Score and Plan: 3 and Treatment may vary due to age or medical condition, Ondansetron and Dexamethasone  Airway Management Planned: LMA  Additional Equipment: None  Intra-op Plan:   Post-operative Plan:   Informed Consent: I have reviewed the patients History and Physical, chart, labs and discussed the procedure including the risks, benefits and alternatives for the proposed anesthesia with the patient or authorized representative who has indicated his/her understanding and acceptance.     Dental advisory given  Plan Discussed with: CRNA  Anesthesia Plan Comments: (Follows with cardiology for hx of HFpEF, persistent Afib (not on anticoag due to recurrent bleeding complications), CAD s/p PCI to RCA in 2002. Last seen by Dr. Sallyanne Kuster 10/21/18, per note "He denies palpitations or syncope.  Overall feels that he is doing well.  He has lost some real  weight.  Appears euvolemic at today's weight of 154 pounds." It was also discussed that he would likely need stump revision in the near future due to nonhealing wounds.   Dr Croitoru's note also mentions pt is on chronic steroid therapy for "arthritis".  He has not been able to wean dose in many years. This makes him very prone to sodium retention and hypervolemia. He also needs to be aware that he will require "stress doses of hydrocortisone" for any serious illnesses or major surgeries.  Follows with neurology for history of seizures. History of recurrent staring spells and had a witnessed convulsion in 2017. EEG in 2017 reported left hemisphere epileptiform discharges. He was admitted in January 2020 for altered mental status with report of staring, prior to this the last staring spell was in 2018. No further seizures since Jan 2020. He is on low dose depakote (subtherapeutic per neurology notes, pt unable to tolerate higher dose).  CKD 3, baseline creatinine ~1.3.  Pt will need DOS labs and eval.  EKG 05/09/18: Afib. Vent rate 90.   TTE 03/25/18: - Left ventricle: The cavity size was normal. Wall thickness was   increased in a pattern of moderate LVH. Systolic function was   normal. The estimated ejection fraction was in the range of 55%   to 60%. Left ventricular diastolic function parameters were   normal. - Aortic valve: There was mild regurgitation. - Mitral valve: Calcified annulus. Mildly thickened leaflets .   There was mild regurgitation. Valve area  by pressure half-time:   1.04 cm^2. - Left atrium: The atrium was mildly dilated. - Atrial septum: No defect or patent foramen ovale was identified.)      Anesthesia Quick Evaluation

## 2019-01-23 ENCOUNTER — Encounter (HOSPITAL_COMMUNITY): Payer: Self-pay

## 2019-01-23 ENCOUNTER — Encounter (HOSPITAL_COMMUNITY)
Admission: RE | Disposition: A | Discharge status: Home / Self Care | Payer: Self-pay | Source: Home / Self Care | Attending: Orthopedic Surgery

## 2019-01-23 ENCOUNTER — Ambulatory Visit (HOSPITAL_COMMUNITY): Payer: Medicare Other | Admitting: Physician Assistant

## 2019-01-23 ENCOUNTER — Ambulatory Visit (HOSPITAL_COMMUNITY)
Admission: RE | Admit: 2019-01-23 | Discharge: 2019-01-23 | Disposition: A | Discharge status: Home / Self Care | Payer: Medicare Other | Attending: Orthopedic Surgery | Admitting: Orthopedic Surgery

## 2019-01-23 ENCOUNTER — Other Ambulatory Visit: Payer: Self-pay

## 2019-01-23 DIAGNOSIS — K219 Gastro-esophageal reflux disease without esophagitis: Secondary | ICD-10-CM | POA: Insufficient documentation

## 2019-01-23 DIAGNOSIS — Z888 Allergy status to other drugs, medicaments and biological substances status: Secondary | ICD-10-CM | POA: Insufficient documentation

## 2019-01-23 DIAGNOSIS — N183 Chronic kidney disease, stage 3 unspecified: Secondary | ICD-10-CM | POA: Diagnosis not present

## 2019-01-23 DIAGNOSIS — E7800 Pure hypercholesterolemia, unspecified: Secondary | ICD-10-CM | POA: Diagnosis not present

## 2019-01-23 DIAGNOSIS — Y838 Other surgical procedures as the cause of abnormal reaction of the patient, or of later complication, without mention of misadventure at the time of the procedure: Secondary | ICD-10-CM | POA: Insufficient documentation

## 2019-01-23 DIAGNOSIS — Z79890 Hormone replacement therapy (postmenopausal): Secondary | ICD-10-CM | POA: Insufficient documentation

## 2019-01-23 DIAGNOSIS — Z885 Allergy status to narcotic agent status: Secondary | ICD-10-CM | POA: Insufficient documentation

## 2019-01-23 DIAGNOSIS — Z7982 Long term (current) use of aspirin: Secondary | ICD-10-CM | POA: Insufficient documentation

## 2019-01-23 DIAGNOSIS — I4819 Other persistent atrial fibrillation: Secondary | ICD-10-CM | POA: Diagnosis not present

## 2019-01-23 DIAGNOSIS — Z96653 Presence of artificial knee joint, bilateral: Secondary | ICD-10-CM | POA: Insufficient documentation

## 2019-01-23 DIAGNOSIS — E039 Hypothyroidism, unspecified: Secondary | ICD-10-CM | POA: Diagnosis not present

## 2019-01-23 DIAGNOSIS — T8781 Dehiscence of amputation stump: Secondary | ICD-10-CM

## 2019-01-23 DIAGNOSIS — Z87891 Personal history of nicotine dependence: Secondary | ICD-10-CM | POA: Insufficient documentation

## 2019-01-23 DIAGNOSIS — R569 Unspecified convulsions: Secondary | ICD-10-CM | POA: Insufficient documentation

## 2019-01-23 DIAGNOSIS — Z89511 Acquired absence of right leg below knee: Secondary | ICD-10-CM | POA: Diagnosis not present

## 2019-01-23 DIAGNOSIS — Z79899 Other long term (current) drug therapy: Secondary | ICD-10-CM | POA: Diagnosis not present

## 2019-01-23 DIAGNOSIS — Z791 Long term (current) use of non-steroidal anti-inflammatories (NSAID): Secondary | ICD-10-CM | POA: Insufficient documentation

## 2019-01-23 DIAGNOSIS — Z7952 Long term (current) use of systemic steroids: Secondary | ICD-10-CM | POA: Insufficient documentation

## 2019-01-23 DIAGNOSIS — Z9861 Coronary angioplasty status: Secondary | ICD-10-CM | POA: Diagnosis not present

## 2019-01-23 DIAGNOSIS — I2510 Atherosclerotic heart disease of native coronary artery without angina pectoris: Secondary | ICD-10-CM | POA: Diagnosis not present

## 2019-01-23 DIAGNOSIS — M1990 Unspecified osteoarthritis, unspecified site: Secondary | ICD-10-CM | POA: Diagnosis not present

## 2019-01-23 DIAGNOSIS — I130 Hypertensive heart and chronic kidney disease with heart failure and stage 1 through stage 4 chronic kidney disease, or unspecified chronic kidney disease: Secondary | ICD-10-CM | POA: Insufficient documentation

## 2019-01-23 DIAGNOSIS — I5030 Unspecified diastolic (congestive) heart failure: Secondary | ICD-10-CM | POA: Insufficient documentation

## 2019-01-23 DIAGNOSIS — S88119A Complete traumatic amputation at level between knee and ankle, unspecified lower leg, initial encounter: Secondary | ICD-10-CM

## 2019-01-23 DIAGNOSIS — Z8249 Family history of ischemic heart disease and other diseases of the circulatory system: Secondary | ICD-10-CM | POA: Insufficient documentation

## 2019-01-23 HISTORY — PX: STUMP REVISION: SHX6102

## 2019-01-23 HISTORY — PX: APPLICATION OF WOUND VAC: SHX5189

## 2019-01-23 HISTORY — DX: Pneumonia, unspecified organism: J18.9

## 2019-01-23 LAB — BASIC METABOLIC PANEL
Anion gap: 12 (ref 5–15)
BUN: 43 mg/dL — ABNORMAL HIGH (ref 8–23)
CO2: 23 mmol/L (ref 22–32)
Calcium: 8.7 mg/dL — ABNORMAL LOW (ref 8.9–10.3)
Chloride: 104 mmol/L (ref 98–111)
Creatinine, Ser: 1.48 mg/dL — ABNORMAL HIGH (ref 0.61–1.24)
GFR calc Af Amer: 50 mL/min — ABNORMAL LOW (ref >60)
GFR calc non Af Amer: 43 mL/min — ABNORMAL LOW (ref >60)
Glucose, Bld: 89 mg/dL (ref 70–99)
Potassium: 4.4 mmol/L (ref 3.5–5.1)
Sodium: 139 mmol/L (ref 135–145)

## 2019-01-23 LAB — CBC
HCT: 41.2 % (ref 39.0–52.0)
Hemoglobin: 12.4 g/dL — ABNORMAL LOW (ref 13.0–17.0)
MCH: 32.6 pg (ref 26.0–34.0)
MCHC: 30.1 g/dL (ref 30.0–36.0)
MCV: 108.4 fL — ABNORMAL HIGH (ref 80.0–100.0)
Platelets: 135 10*3/uL — ABNORMAL LOW (ref 150–400)
RBC: 3.8 MIL/uL — ABNORMAL LOW (ref 4.22–5.81)
RDW: 14.7 % (ref 11.5–15.5)
WBC: 7.3 10*3/uL (ref 4.0–10.5)
nRBC: 0 % (ref 0.0–0.2)

## 2019-01-23 SURGERY — REVISION, AMPUTATION SITE
Anesthesia: General | Site: Leg Lower | Laterality: Right

## 2019-01-23 MED ORDER — DEXAMETHASONE SODIUM PHOSPHATE 10 MG/ML IJ SOLN
INTRAMUSCULAR | Status: AC
  Filled 2019-01-23: qty 2

## 2019-01-23 MED ORDER — CEFAZOLIN SODIUM-DEXTROSE 2-4 GM/100ML-% IV SOLN
2.0000 g | INTRAVENOUS | Status: AC
  Administered 2019-01-23: 2 g via INTRAVENOUS
  Filled 2019-01-23: qty 100

## 2019-01-23 MED ORDER — CHLORHEXIDINE GLUCONATE 4 % EX LIQD: 60.0000 mL | Freq: Once | CUTANEOUS | Status: DC

## 2019-01-23 MED ORDER — PROPOFOL 10 MG/ML IV BOLUS
INTRAVENOUS | Status: AC
  Filled 2019-01-23: qty 20

## 2019-01-23 MED ORDER — ONDANSETRON HCL 4 MG/2ML IJ SOLN: 4.0000 mg | Freq: Once | INTRAMUSCULAR | Status: DC | PRN

## 2019-01-23 MED ORDER — FENTANYL CITRATE (PF) 250 MCG/5ML IJ SOLN
INTRAMUSCULAR | Status: AC
  Filled 2019-01-23: qty 5

## 2019-01-23 MED ORDER — FENTANYL CITRATE (PF) 100 MCG/2ML IJ SOLN
INTRAMUSCULAR | Status: AC
  Filled 2019-01-23: qty 2

## 2019-01-23 MED ORDER — LIDOCAINE 2% (20 MG/ML) 5 ML SYRINGE
INTRAMUSCULAR | Status: DC | PRN
  Administered 2019-01-23: 100 mg via INTRAVENOUS

## 2019-01-23 MED ORDER — OXYCODONE-ACETAMINOPHEN 5-325 MG PO TABS: 1.0000 | ORAL_TABLET | ORAL | 0 refills | Status: DC | PRN

## 2019-01-23 MED ORDER — EPHEDRINE SULFATE 50 MG/ML IJ SOLN
INTRAMUSCULAR | Status: DC | PRN
  Administered 2019-01-23: 5 mg via INTRAVENOUS
  Administered 2019-01-23: 10 mg via INTRAVENOUS
  Administered 2019-01-23: 15 mg via INTRAVENOUS
  Administered 2019-01-23 (×2): 10 mg via INTRAVENOUS

## 2019-01-23 MED ORDER — ONDANSETRON HCL 4 MG/2ML IJ SOLN
INTRAMUSCULAR | Status: DC | PRN
  Administered 2019-01-23: 4 mg via INTRAVENOUS

## 2019-01-23 MED ORDER — ACETAMINOPHEN 10 MG/ML IV SOLN: 1000.0000 mg | Freq: Once | INTRAVENOUS | Status: DC | PRN

## 2019-01-23 MED ORDER — ONDANSETRON HCL 4 MG/2ML IJ SOLN
INTRAMUSCULAR | Status: AC
  Filled 2019-01-23: qty 4

## 2019-01-23 MED ORDER — LIDOCAINE 2% (20 MG/ML) 5 ML SYRINGE
INTRAMUSCULAR | Status: AC
  Filled 2019-01-23: qty 10

## 2019-01-23 MED ORDER — PROPOFOL 10 MG/ML IV BOLUS
INTRAVENOUS | Status: DC | PRN
  Administered 2019-01-23: 100 mg via INTRAVENOUS

## 2019-01-23 MED ORDER — 0.9 % SODIUM CHLORIDE (POUR BTL) OPTIME
TOPICAL | Status: DC | PRN
  Administered 2019-01-23: 1000 mL

## 2019-01-23 MED ORDER — FENTANYL CITRATE (PF) 100 MCG/2ML IJ SOLN
25.0000 ug | INTRAMUSCULAR | Status: DC | PRN
  Administered 2019-01-23 (×2): 25 ug via INTRAVENOUS

## 2019-01-23 MED ORDER — LACTATED RINGERS IV SOLN
INTRAVENOUS | Status: DC
  Administered 2019-01-23: 07:00:00 via INTRAVENOUS

## 2019-01-23 MED ORDER — FENTANYL CITRATE (PF) 100 MCG/2ML IJ SOLN
INTRAMUSCULAR | Status: DC | PRN
  Administered 2019-01-23: 25 ug via INTRAVENOUS

## 2019-01-23 MED ORDER — HYDROCORTISONE NA SUCCINATE PF 100 MG IJ SOLR
INTRAMUSCULAR | Status: DC | PRN
  Administered 2019-01-23: 50 mg via INTRAVENOUS

## 2019-01-23 MED ORDER — HYDROCORTISONE NA SUCCINATE PF 250 MG IJ SOLR
INTRAMUSCULAR | Status: AC
  Filled 2019-01-23: qty 250

## 2019-01-23 SURGICAL SUPPLY — 37 items
BLADE SAW RECIP 87.9 MT (BLADE) ×2 IMPLANT
BLADE SURG 21 STRL SS (BLADE) ×3 IMPLANT
BNDG COHESIVE 6X5 TAN STRL LF (GAUZE/BANDAGES/DRESSINGS) ×2 IMPLANT
CANISTER WOUND CARE 500ML ATS (WOUND CARE) ×1 IMPLANT
COVER SURGICAL LIGHT HANDLE (MISCELLANEOUS) ×3 IMPLANT
DRAPE EXTREMITY T 121X128X90 (DISPOSABLE) ×3 IMPLANT
DRAPE HALF SHEET 40X57 (DRAPES) ×3 IMPLANT
DRAPE INCISE IOBAN 66X45 STRL (DRAPES) ×3 IMPLANT
DRAPE U-SHAPE 47X51 STRL (DRAPES) ×4 IMPLANT
DRESSING PREVENA PLUS CUSTOM (GAUZE/BANDAGES/DRESSINGS) ×1 IMPLANT
DRSG PREVENA PLUS CUSTOM (GAUZE/BANDAGES/DRESSINGS) ×3
DURAPREP 26ML APPLICATOR (WOUND CARE) ×3 IMPLANT
ELECT REM PT RETURN 9FT ADLT (ELECTROSURGICAL) ×3
ELECTRODE REM PT RTRN 9FT ADLT (ELECTROSURGICAL) ×1 IMPLANT
GLOVE BIO SURGEON STRL SZ 6.5 (GLOVE) ×2 IMPLANT
GLOVE BIO SURGEONS STRL SZ 6.5 (GLOVE) ×1
GLOVE BIOGEL PI IND STRL 7.0 (GLOVE) IMPLANT
GLOVE BIOGEL PI IND STRL 9 (GLOVE) ×1 IMPLANT
GLOVE BIOGEL PI INDICATOR 7.0 (GLOVE) ×2
GLOVE BIOGEL PI INDICATOR 9 (GLOVE) ×2
GLOVE SURG ORTHO 9.0 STRL STRW (GLOVE) ×3 IMPLANT
GOWN STRL REUS W/ TWL XL LVL3 (GOWN DISPOSABLE) ×2 IMPLANT
GOWN STRL REUS W/TWL XL LVL3 (GOWN DISPOSABLE) ×9
KIT BASIN OR (CUSTOM PROCEDURE TRAY) ×3 IMPLANT
KIT TURNOVER KIT B (KITS) ×3 IMPLANT
NS IRRIG 1000ML POUR BTL (IV SOLUTION) ×3 IMPLANT
PACK GENERAL/GYN (CUSTOM PROCEDURE TRAY) ×3 IMPLANT
PAD ARMBOARD 7.5X6 YLW CONV (MISCELLANEOUS) ×5 IMPLANT
PREVENA INCISION MGT 90 150 (MISCELLANEOUS) ×2 IMPLANT
PREVENA RESTOR ARTHOFORM 46X30 (CANNISTER) ×3 IMPLANT
STAPLER VISISTAT 35W (STAPLE) ×3 IMPLANT
SUT ETHILON 2 0 PSLX (SUTURE) ×6 IMPLANT
SUT SILK 2 0 (SUTURE) ×3
SUT SILK 2-0 18XBRD TIE 12 (SUTURE) IMPLANT
TOWEL GREEN STERILE (TOWEL DISPOSABLE) ×3 IMPLANT
TUBE CONNECTING 12'X1/4 (SUCTIONS) ×1
TUBE CONNECTING 12X1/4 (SUCTIONS) ×1 IMPLANT

## 2019-01-23 NOTE — Transfer of Care (Signed)
Immediate Anesthesia Transfer of Care Note  Patient: [name]  Procedure(s) Performed: REVISION RIGHT BELOW KNEE AMPUTATION (Right Leg Lower) Application Of  Prevena Wound Vac (Right Leg Lower)  Patient Location: PACU  Anesthesia Type:General  Level of Consciousness: awake, oriented, drowsy and responds to stimulation  Airway & Oxygen Therapy: Patient Spontanous Breathing and Patient connected to nasal cannula oxygen  Post-op Assessment: Report given to RN and Post -op Vital signs reviewed and stable  Post vital signs: Reviewed and stable  Last Vitals:  Vitals Value Taken Time  BP 131/65 01/23/19 0949  Temp    Pulse 69 01/23/19 0951  Resp 12 01/23/19 0951  SpO2 100 % 01/23/19 0951  Vitals shown include unvalidated device data.  Last Pain:  Vitals:   01/23/19 0707  TempSrc:   PainSc: 4          Complications: No apparent anesthesia complications

## 2019-01-23 NOTE — Op Note (Signed)
01/23/2019  9:55 AM  PATIENT:  [name]    PRE-OPERATIVE DIAGNOSIS:  Dehiscence Right Below Knee Amputation  POST-OPERATIVE DIAGNOSIS:  Same  PROCEDURE:  REVISION RIGHT BELOW KNEE AMPUTATION, Application Of  Prevena Wound Vac  SURGEON:  Newt Minion, MD  PHYSICIAN ASSISTANT:None ANESTHESIA:   General  PREOPERATIVE INDICATIONS:  [name] is a  85 y.o. male with a diagnosis of Dehiscence Right Below Knee Amputation who failed conservative measures and elected for surgical management.    The risks benefits and alternatives were discussed with the patient preoperatively including but not limited to the risks of infection, bleeding, nerve injury, cardiopulmonary complications, the need for revision surgery, among others, and the patient was willing to proceed.  OPERATIVE IMPLANTS: Praveena customizable arthroform VAC  @ENCIMAGES @  OPERATIVE FINDINGS: Petechial bleeding with ischemic muscle changes.  No exposed intramedullary rod from the total knee  OPERATIVE PROCEDURE: Patient was brought to the operating room and underwent a general anesthetic.  After adequate levels anesthesia was obtained patient's right lower extremity was prepped using DuraPrep draped into a sterile field a timeout was called.  A fishmouth incision was made to encompass the ulcerative tissue this was carried sharply down to bone the distal 2 cm of the tibia and fibula were resected.  There was no exposed hardware from the intramedullary nail fixation.  The vascular bundles were suture ligated with 2-0 silk electrocautery was used for the petechial bleeding.  The deep and superficial fascia layers and skin was closed using 2-0 nylon a customizable and arthroform Praveena wound VAC dressing was applied this was covered with Covan this had a good suction fit patient was extubated taken the PACU in stable condition.   DISCHARGE PLANNING:  Antibiotic duration: Preoperative antibiotics  Weightbearing:  Nonweightbearing on the right  Pain medication: Prescription for oral pain medicine  Dressing care/ Wound VAC: Continue wound VAC dressing for 1 week  Ambulatory devices: Walker  Discharge to: Home.  Follow-up: In the office 1 week post operative.

## 2019-01-23 NOTE — Anesthesia Postprocedure Evaluation (Signed)
Anesthesia Post Note  Patient: [name]  Procedure(s) Performed: REVISION RIGHT BELOW KNEE AMPUTATION (Right Leg Lower) Application Of  Prevena Wound Vac (Right Leg Lower)     Patient location during evaluation: PACU Anesthesia Type: General Level of consciousness: awake and alert Pain management: pain level controlled Vital Signs Assessment: post-procedure vital signs reviewed and stable Respiratory status: spontaneous breathing, nonlabored ventilation, respiratory function stable and patient connected to nasal cannula oxygen Cardiovascular status: blood pressure returned to baseline and stable Postop Assessment: no apparent nausea or vomiting Anesthetic complications: no    Last Vitals:  Vitals:   01/23/19 1004 01/23/19 1019  BP: (!) 141/69 134/74  Pulse: 68 84  Resp: 12 13  Temp:    SpO2: 99% 100%    Last Pain:  Vitals:   01/23/19 1004  TempSrc:   PainSc: 7                  [place]

## 2019-01-23 NOTE — Progress Notes (Signed)
Chaplain spoke with [name] about AD. [name] was already in surgery. [name] said they would either take the document to their bank or revisit the [facility] office once [name] has recovered from anesthesia in order to have the document notarized. Chaplain remains available for support as needs arise.   Chaplain Resident, Evelene Croon, M Div

## 2019-01-23 NOTE — H&P (Signed)
[name] is an 85 y.o. male.   Chief Complaint: Right BKA Wound dehiscence HPI: Patient is a 85 year old gentleman status post right transtibial amputation 10 months ago.  Patient states that the wounds are smaller but still open and draining.  Patient has been scheduled in the past for revision surgery and he canceled the surgery.  Past Medical History:  Diagnosis Date  . Abnormality of gait 09/22/2015  . Arthritis   . Atrial fibrillation ([place])   . CAD (coronary artery disease)    Stent to RCA, Penta stent, 99% reduced to 0% 2002.  . Cancer ([place])    skin CA removed from back  . Chronic insomnia 03/30/2015  . Complication of anesthesia    trouble waking up  . GERD (gastroesophageal reflux disease)   . Hypercholesteremia   . Hypertension   . Hypothyroidism   . Memory difficulty 09/22/2015  . Osteoarthritis   . Pneumonia   . Seizures ([place])   . Sepsis ([place]) 05/2017  . Transient alteration of awareness 03/30/2015  . Vertigo    hx of    Past Surgical History:  Procedure Laterality Date  . AMPUTATION Right 03/28/2018   Procedure: AMPUTATION BELOW KNEE;  Surgeon: Newt Minion, MD;  Location: [facility];  Service: Orthopedics;  Laterality: Right;  . BACK SURGERY    . EYE SURGERY     Bilateral Cataract surgery   . HERNIA REPAIR    . I&D EXTREMITY Right 05/10/2015   Procedure: IRRIGATION AND DEBRIDEMENT EXTREMITY;  Surgeon: Leanora Cover, MD;  Location: [facility];  Service: Orthopedics;  Laterality: Right;  . KNEE ARTHROPLASTY     right knee X 2; left knee once  . LAMINECTOMY     X 6  . LEG AMPUTATION BELOW KNEE Right 03/28/2018  . POSTERIOR CERVICAL FUSION/FORAMINOTOMY  01/28/2012   Procedure: POSTERIOR CERVICAL FUSION/FORAMINOTOMY LEVEL 3;  Surgeon: [name], MD;  Location: [facility];  Service: Neurosurgery;  Laterality: Left;  Posterior Cervical Five-Thoracic One Fusion, Arthrodesis with LEFT Cervical Seven-thoracic One Laminectomy, Foraminotomy and  Resection of Synovial Cyst  . POSTERIOR CERVICAL FUSION/FORAMINOTOMY N/A 01/29/2013   Procedure: POSTERIOR CERVICAL FUSION/FORAMINOTOMY LEVEL 1 and C2-5 Posteriolateral Arthrodesis;  Surgeon: [name], MD;  Location: [facility];  Service: Neurosurgery;  Laterality: N/A;  C2-C3 Laminectomy C2-C3 posterior cervical arthrodesis  . TONSILLECTOMY      Family History  Problem Relation Age of Onset  . Hypertension Mother   . Cancer Mother   . Kidney failure Father   . Heart disease Father    Social History:  reports that he has quit smoking. He has never used smokeless tobacco. He reports previous alcohol use. He reports that he does not use drugs.  Allergies:  Allergies  Allergen Reactions  . Eliquis [Apixaban] Other (See Comments)    bleeding  . Demerol [Meperidine] Nausea And Vomiting  . Keppra [Levetiracetam] Other (See Comments)    Causes sleepiness, mental status changes  . Vimpat [Lacosamide]     Over sedated     Medications Prior to Admission  Medication Sig Dispense Refill  . alendronate (FOSAMAX) 70 MG tablet Take 70 mg by mouth every Wednesday. Remain upright for 30-60 minutes.    . Amino Acids-Protein Hydrolys (FEEDING SUPPLEMENT, PRO-STAT SUGAR FREE 64,) LIQD Take 30 mLs by mouth 2 (two) times daily.     Marland Kitchen aspirin EC 81 MG tablet Take 81 mg by mouth daily.    . Calcium Carb-Cholecalciferol (CALCIUM  600+D) 600-800 MG-UNIT TABS Take 1 tablet by mouth daily.    . Cholecalciferol (VITAMIN D) 125 MCG (5000 UT) CAPS Take 5,000 Units by mouth daily.     . cyanocobalamin 500 MCG tablet Take 500 mcg by mouth at bedtime. Vitamin B12    . diclofenac sodium (VOLTAREN) 1 % GEL Apply 2 g topically daily as needed (pain).    Marland Kitchen diltiazem (CARDIZEM CD) 240 MG 24 hr capsule Take 240 mg every morning   Hold if systolic blood pressure is less than 100. 90 capsule 3  . divalproex (DEPAKOTE ER) 250 MG 24 hr tablet Take 250-500 mg by mouth See admin instructions. Take 250 mg in the  morning and 500 mg in the evening    . doxazosin (CARDURA) 4 MG tablet Take 4 mg by mouth at bedtime.     . furosemide (LASIX) 20 MG tablet Take 1 tablet (20 mg total) by mouth daily. 90 tablet 3  . HYDROcodone-acetaminophen (NORCO) 10-325 MG tablet Take 1 tablet by mouth every 4 (four) hours as needed for moderate pain.    Marland Kitchen levothyroxine (SYNTHROID, LEVOTHROID) 150 MCG tablet Take 150 mcg by mouth daily before breakfast.    . lidocaine (LIDODERM) 5 % Place 1 patch onto the skin daily as needed (pain). Remove & Discard patch within 12 hours or as directed by MD    . metoprolol tartrate (LOPRESSOR) 25 MG tablet TAKE 1 TABLET BY MOUTH TWICE DAILY WITH A MEAL OR IMMEDIATELY AFTER (Patient taking differently: Take 25 mg by mouth 2 (two) times daily with a meal. ) 180 tablet 2  . Multiple Vitamins-Minerals (CERTAVITE/ANTIOXIDANTS) TABS Take 1 tablet by mouth every evening.     . mupirocin ointment (BACTROBAN) 2 % Apply 1 application topically daily. Apply to right lateral leg wound daily (Patient taking differently: Apply 1 application topically daily as needed (wound care). Apply to right lateral leg wound daily) 22 g 0  . nitroGLYCERIN (NITROSTAT) 0.4 MG SL tablet Place 1 tablet (0.4 mg total) under the tongue every 5 (five) minutes as needed for chest pain (call 911 afer 3 doses and if chest pain persists). 25 tablet 11  . omeprazole (PRILOSEC) 20 MG capsule Take 20 mg by mouth daily before breakfast.     . oxymetazoline (AFRIN) 0.05 % nasal spray Place 1 spray into both nostrils 2 (two) times daily as needed for congestion.    . potassium chloride SA (K-DUR) 20 MEQ tablet Take 10 mEq by mouth daily.  90 tablet 3  . pravastatin (PRAVACHOL) 40 MG tablet Take 40 mg by mouth every evening.   2  . predniSONE (DELTASONE) 10 MG tablet Take 10 mg by mouth daily with breakfast.    . trimethoprim (TRIMPEX) 100 MG tablet Take 100 mg by mouth daily.    . cephALEXin (KEFLEX) 500 MG capsule Take 1 capsule (500 mg  total) by mouth 4 (four) times daily. (Patient not taking: Reported on 01/21/2019) 20 capsule 0  . Lacosamide (VIMPAT) 100 MG TABS Take 1 tablet (100 mg total) by mouth 2 (two) times a day. (Patient not taking: Reported on 01/21/2019) 14 tablet 0  . lacosamide (VIMPAT) 50 MG TABS tablet Take 25mg  twice a day for a week, then increase to 50 mg  Twice a day for 1 week then increase to 75mg  twice a day for 1 week then increase to 100 mg twice a day (Patient not taking: Reported on 01/21/2019) 42 tablet 0  . silver sulfADIAZINE (SILVADENE) 1 %  cream Apply 1 application topically daily. Apply to affected area daily plus dry dressing (Patient not taking: Reported on 01/21/2019) 400 g 3    Results for orders placed or performed during the hospital encounter of 01/22/19 (from the past 48 hour(s))  SARS CORONAVIRUS 2 (TAT 6-24 HRS) Nasopharyngeal Nasopharyngeal Swab     Status: None   Collection Time: 01/22/19  9:43 AM   Specimen: Nasopharyngeal Swab  Result Value Ref Range   SARS Coronavirus 2 NEGATIVE NEGATIVE    Comment: (NOTE) SARS-CoV-2 target nucleic acids are NOT DETECTED. The SARS-CoV-2 RNA is generally detectable in upper and lower respiratory specimens during the acute phase of infection. Negative results do not preclude SARS-CoV-2 infection, do not rule out co-infections with other pathogens, and should not be used as the sole basis for treatment or other patient management decisions. Negative results must be combined with clinical observations, patient history, and epidemiological information. The expected result is Negative. Fact Sheet for Patients: SugarRoll.be Fact Sheet for Healthcare Providers: https://www.woods-mathews.com/ This test is not yet approved or cleared by the [place] FDA and  has been authorized for detection and/or diagnosis of SARS-CoV-2 by FDA under an Emergency Use Authorization (EUA). This EUA will remain  in effect  (meaning this test can be used) for the duration of the COVID-19 declaration under Section 56 4(b)(1) of the Act, 21 U.S.C. section 360bbb-3(b)(1), unless the authorization is terminated or revoked sooner. Performed at [facility], [place] [address], [place], [place] [address]    No results found.  Review of Systems  All other systems reviewed and are negative.   Blood pressure (!) 160/78, pulse 71, temperature 97.8 F (36.6 C), temperature source Oral, resp. rate 18, height 5\' 9"  (1.753 m), weight 68 kg, SpO2 96 %. Physical Exam  Patient is alert, oriented, no adenopathy, well-dressed, normal affect, normal respiratory effort. Examination the lateral wound has no tunneling it is 1 cm in diameter there is epiboly with rolled up edges with clear drainage there is no undermining no tunneling.  The anterior wound is 5 mm in diameter and probes down to fascia over the tibia.  There is persistent clear drainage there is no ascending cellulitis. Assessment/Plan 1. S/P BKA (below knee amputation) unilateral, right ([place])   2. Dehiscence of amputation stump ([facility])     Plan: Discussed with the patient the fact that these wounds are open and draining and present for 10 months the ulcers are most likely being driven by underlying osteomyelitis.  Again have recommended proceeding with revision amputation of the right transtibial amputation.  Risks and benefits were discussed including potential for the wound not healing need for additional surgery.  Patient states he understands he would still like to think about his options I will have my office call him tomorrow.   Bevely Palmer Malkie Wille, PA 01/23/2019, 7:03 AM

## 2019-01-23 NOTE — Progress Notes (Signed)
Called Chaplain for Advance Directive.  No answer.

## 2019-01-24 ENCOUNTER — Encounter (HOSPITAL_COMMUNITY): Payer: Self-pay | Admitting: Orthopedic Surgery

## 2019-01-29 ENCOUNTER — Encounter: Payer: Self-pay | Admitting: Orthopedic Surgery

## 2019-01-29 ENCOUNTER — Other Ambulatory Visit: Payer: Self-pay

## 2019-01-29 ENCOUNTER — Ambulatory Visit (INDEPENDENT_AMBULATORY_CARE_PROVIDER_SITE_OTHER): Payer: Medicare Other | Admitting: Orthopedic Surgery

## 2019-01-29 VITALS — Ht 69.0 in | Wt 150.0 lb

## 2019-01-29 DIAGNOSIS — Z89511 Acquired absence of right leg below knee: Secondary | ICD-10-CM

## 2019-01-29 DIAGNOSIS — T8781 Dehiscence of amputation stump: Secondary | ICD-10-CM

## 2019-01-30 ENCOUNTER — Ambulatory Visit: Payer: Medicare Other

## 2019-01-30 DIAGNOSIS — Z89511 Acquired absence of right leg below knee: Secondary | ICD-10-CM

## 2019-01-30 NOTE — Progress Notes (Signed)
Pt is s/p a revision right below the knee amputation  01/23/19, seen in the office yesterday and here for a nurse only visit today. The patient is requesting a dressing change. The dressing that was applied in the office yesterday has been removed.  There is a moderate amount of bloody drainage from the incision. The limb is swollen. The pt states that he has been sitting with the limb down dependant. 4X4, Kerlix and ace bandage were applied to the surgical site. reviewed instructions for dressing with pt and his wife. Advised to keep the leg elevated and to change the dressing daily. Advised to follow up in the office on Monday and to call should they have any additional questions. Both patient and his wife voiced understanding and seemed happy with this plan. Additional supplies were given to the patient to get them through the weekend.   Rayne Cowdrey, RMA,CWCA

## 2019-02-02 ENCOUNTER — Encounter: Payer: Self-pay | Admitting: Physician Assistant

## 2019-02-02 ENCOUNTER — Other Ambulatory Visit: Payer: Self-pay

## 2019-02-02 ENCOUNTER — Ambulatory Visit (INDEPENDENT_AMBULATORY_CARE_PROVIDER_SITE_OTHER): Payer: Medicare Other | Admitting: Orthopedic Surgery

## 2019-02-02 VITALS — Ht 69.0 in | Wt 150.0 lb

## 2019-02-02 DIAGNOSIS — Z89511 Acquired absence of right leg below knee: Secondary | ICD-10-CM

## 2019-02-02 NOTE — Progress Notes (Signed)
Office Visit Note   Patient: [name]           Date of Birth: [birth year]           MRN: [id number] Visit Date: 02/02/2019              Requested by: [name], [place],  [place] [address] PCP: [name], MD  Chief Complaint  Patient presents with  . Right Leg - Routine Post Op    01/23/19 right BKA revision       HPI: Patient is an 85 year old gentleman who presents 10 days status post revision right transtibial amputation.  Patient's wife states that she does not feel comfortable applying a dry dressing.  She feels like the ulcer over the patella tendon has gotten worse and she is concerned that this may get worse with her providing the dressing change.  Assessment & Plan: Visit Diagnoses:  1. S/P BKA (below knee amputation) unilateral, right ([place])     Plan: Patient and his wife will come back every other day for nurse visit dressing change.  Apply Bactroban to the patella tendon skin blister 4 x 4 gauze to the wounds plus a Curlex and Ace wrap.  Follow-Up Instructions: Return in about 1 week (around 02/09/2019) for Follow-up every other day for nurse visit only.   Ortho Exam  Patient is alert, oriented, no adenopathy, well-dressed, normal affect, normal respiratory effort. Examination patient has a superficial blister over the patella tendon.  This measures 5 mm in diameter 0.1 mm deep there is no ischemic changes.  Patient does have significant swelling in the right lower extremity there is slight gaping of the incision with bloody drainage.  Discussed the importance of elevation with his foot level with the heart to help decrease the swelling.  Imaging: No results found. No images are attached to the encounter.  Labs: Lab Results  Component Value Date   HGBA1C 5.7 (H) 03/26/2018   HGBA1C 5.0 07/12/2017   HGBA1C 5.8 (H) 07/06/2015   ESRSEDRATE 24 (H) 03/24/2018   ESRSEDRATE 15 04/18/2017   ESRSEDRATE 5 09/17/2007   CRP 3.1 (H)  05/11/2018   CRP 1.8 (H) 03/24/2018   CRP <0.8 04/24/2017   LABURIC 6.0 03/09/2016   REPTSTATUS 12/22/2018 FINAL 12/20/2018   GRAMSTAIN  07/15/2017    WBC PRESENT,BOTH PMN AND MONONUCLEAR NO ORGANISMS SEEN Performed at [facility], [place] [address], [place], [place] [address]    CULT >=100,000 COLONIES/mL ESCHERICHIA COLI (A) 12/20/2018   LABORGA ESCHERICHIA COLI (A) 12/20/2018     Lab Results  Component Value Date   ALBUMIN 3.6 12/20/2018   ALBUMIN 3.0 (L) 05/09/2018   ALBUMIN 2.5 (L) 03/31/2018   PREALBUMIN 36 (H) 11/22/2017   PREALBUMIN 28.8 04/24/2017   LABURIC 6.0 03/09/2016    Lab Results  Component Value Date   MG 2.0 05/12/2018   MG 1.9 05/11/2018   MG 1.9 03/31/2018   No results found for: VD25OH  Lab Results  Component Value Date   PREALBUMIN 36 (H) 11/22/2017   PREALBUMIN 28.8 04/24/2017   CBC EXTENDED Latest Ref Rng & Units 01/23/2019 12/20/2018 11/08/2018  WBC 4.0 - 10.5 K/uL 7.3 11.3(H) 9.6  RBC 4.22 - 5.81 MIL/uL 3.80(L) 3.80(L) 3.29(L)  HGB 13.0 - 17.0 g/dL 12.4(L) 12.5(L) 10.9(L)  HCT 39.0 - 52.0 % 41.2 40.0 34.6(L)  PLT 150 - 400 K/uL 135(L) 132(L) 128(L)  NEUTROABS 1.7 - 7.7 K/uL - 9.6(H) 7.8(H)  LYMPHSABS  0.7 - 4.0 K/uL - 0.6(L) 0.7     Body mass index is 22.15 kg/m.  Orders:  No orders of the defined types were placed in this encounter.  No orders of the defined types were placed in this encounter.    Procedures: No procedures performed  Clinical Data: No additional findings.  ROS:  All other systems negative, except as noted in the HPI. Review of Systems  Objective: Vital Signs: Ht 5\' 9"  (1.753 m)   Wt 150 lb (68 kg)   BMI 22.15 kg/m   Specialty Comments:  No specialty comments available.  PMFS History: Patient Active Problem List   Diagnosis Date Noted  . Dehiscence of amputation stump ([place])   . Wound infection 05/09/2018  . AKI (acute kidney injury) ([place])   . S/P BKA (below knee amputation) unilateral,  right ([place])   . Below-knee amputation with complication, initial encounter ([place])   . Tachypnea   . Post-operative pain   . Subacute osteomyelitis, right ankle and foot ([place])   . Paralysis of right lower extremity ([place])   . TIA (transient ischemic attack) 03/24/2018  . Encephalopathy 03/24/2018  . Cellulitis 03/15/2018  . Hypernatremia 03/15/2018  . Persistent atrial fibrillation ([place]) 11/10/2017  . Severe muscle deconditioning 11/10/2017  . Hemorrhage 10/22/2017  . CAD S/P percutaneous coronary angioplasty 10/22/2017  . Acute hypoxemic respiratory failure ([place]) 07/17/2017  . Aspiration syndrome, subsequent encounter 07/11/2017  . Aspiration pneumonia ([place]) 07/01/2017  . Acute encephalopathy 07/01/2017  . Thrombocytopenia ([place]) 07/01/2017  . Chronic diastolic (congestive) heart failure ([place]) 07/01/2017  . Anemia of chronic disease 07/01/2017  . Troponin level elevated 07/01/2017  . Chronic atrial fibrillation 07/01/2017  . Goals of care, counseling/discussion   . Palliative care by specialist   . Acute metabolic encephalopathy [id number]  . CKD (chronic kidney disease) stage 3, GFR 30-59 ml/min   . CAP (community acquired pneumonia) 04/17/2017  . Hallux rigidus, right foot 03/10/2016  . Lumbosacral spondylosis without myelopathy 10/29/2015  . Memory difficulty 09/22/2015  . Abnormality of gait 09/22/2015  . Hyperglycemia 07/06/2015  . Chronic pain 07/06/2015  . Chronic insomnia 03/30/2015  . Transient alteration of awareness 03/30/2015  . Abnormal liver function   . Altered mental status 01/31/2015  . Essential hypertension 01/31/2015  . Constipation 01/31/2015  . Hypothyroidism 01/31/2015  . Seizure disorder ([place]) 01/31/2015  . Bladder outlet obstruction 01/31/2015  . GERD (gastroesophageal reflux disease) 01/31/2015  . Chronic back pain 01/31/2015  . Acute kidney injury ([place]) 01/31/2015   Past Medical History:  Diagnosis Date  . Abnormality of gait 09/22/2015  .  Arthritis   . Atrial fibrillation ([place])   . CAD (coronary artery disease)    Stent to RCA, Penta stent, 99% reduced to 0% 2002.  . Cancer ([place])    skin CA removed from back  . Chronic insomnia 03/30/2015  . Complication of anesthesia    trouble waking up  . GERD (gastroesophageal reflux disease)   . Hypercholesteremia   . Hypertension   . Hypothyroidism   . Memory difficulty 09/22/2015  . Osteoarthritis   . Pneumonia   . Seizures ([place])   . Sepsis ([place]) 05/2017  . Transient alteration of awareness 03/30/2015  . Vertigo    hx of    Family History  Problem Relation Age of Onset  . Hypertension Mother   . Cancer Mother   . Kidney failure Father   . Heart disease Father     Past Surgical History:  Procedure  Laterality Date  . AMPUTATION Right 03/28/2018   Procedure: AMPUTATION BELOW KNEE;  Surgeon: Newt Minion, MD;  Location: [facility];  Service: Orthopedics;  Laterality: Right;  . APPLICATION OF WOUND VAC Right 01/23/2019   Procedure: Application Of  Prevena Wound Vac;  Surgeon: Newt Minion, MD;  Location: [facility];  Service: Orthopedics;  Laterality: Right;  . BACK SURGERY    . EYE SURGERY     Bilateral Cataract surgery   . HERNIA REPAIR    . I&D EXTREMITY Right 05/10/2015   Procedure: IRRIGATION AND DEBRIDEMENT EXTREMITY;  Surgeon: Leanora Cover, MD;  Location: [facility];  Service: Orthopedics;  Laterality: Right;  . KNEE ARTHROPLASTY     right knee X 2; left knee once  . LAMINECTOMY     X 6  . LEG AMPUTATION BELOW KNEE Right 03/28/2018  . POSTERIOR CERVICAL FUSION/FORAMINOTOMY  01/28/2012   Procedure: POSTERIOR CERVICAL FUSION/FORAMINOTOMY LEVEL 3;  Surgeon: [name], MD;  Location: [facility];  Service: Neurosurgery;  Laterality: Left;  Posterior Cervical Five-Thoracic One Fusion, Arthrodesis with LEFT Cervical Seven-thoracic One Laminectomy, Foraminotomy and Resection of Synovial Cyst  . POSTERIOR CERVICAL FUSION/FORAMINOTOMY N/A 01/29/2013    Procedure: POSTERIOR CERVICAL FUSION/FORAMINOTOMY LEVEL 1 and C2-5 Posteriolateral Arthrodesis;  Surgeon: [name], MD;  Location: [facility];  Service: Neurosurgery;  Laterality: N/A;  C2-C3 Laminectomy C2-C3 posterior cervical arthrodesis  . STUMP REVISION Right 01/23/2019   Procedure: REVISION RIGHT BELOW KNEE AMPUTATION;  Surgeon: Newt Minion, MD;  Location: [facility];  Service: Orthopedics;  Laterality: Right;  . TONSILLECTOMY     Social History   Occupational History  . Occupation: retired Software engineer  Tobacco Use  . Smoking status: Former Research scientist (life sciences)  . Smokeless tobacco: Never Used  Substance and Sexual Activity  . Alcohol use: Not Currently    Comment: rare  . Drug use: No  . Sexual activity: Not Currently

## 2019-02-04 ENCOUNTER — Other Ambulatory Visit: Payer: Self-pay

## 2019-02-04 ENCOUNTER — Ambulatory Visit (INDEPENDENT_AMBULATORY_CARE_PROVIDER_SITE_OTHER): Payer: Medicare Other | Admitting: Physician Assistant

## 2019-02-04 ENCOUNTER — Encounter: Payer: Self-pay | Admitting: Physician Assistant

## 2019-02-04 VITALS — Ht 69.0 in | Wt 150.0 lb

## 2019-02-04 DIAGNOSIS — Z89511 Acquired absence of right leg below knee: Secondary | ICD-10-CM | POA: Diagnosis not present

## 2019-02-04 DIAGNOSIS — T8781 Dehiscence of amputation stump: Secondary | ICD-10-CM | POA: Diagnosis not present

## 2019-02-04 NOTE — Progress Notes (Signed)
Post-Op Visit Note   Patient: [name]           Date of Birth: 12/24/1933           MRN: [id number] Visit Date: 02/04/2019 PCP: [name], MD  Chief Complaint:  Chief Complaint  Patient presents with  . Right Leg - Routine Post Op    01/23/19 right BKA revision     HPI:  HPI The patient is an 85 year old gentleman who presents today status post revision right below the knee amputation on November 13.  They are not quite anxious as his wife accompanies the visit as for a longstanding history of poor wound healing they are concerned about some pain and redness he is having.  She is concerned about doing dressing changes at home.  They have been doing dressing changes here in the office every other day. There is some concern for ulceration over his proximal tibia wife believes the hose from his wound VAC may have rubbed a wound Ortho Exam On examination of the right below the knee amputation.  Does have tightness and significant edema there is some very mild erythema to the distal aspect of his residual limb the incision is well approximated sutures there are scattered eschars and some dried blood 1 drop of active bleeding.  There is no sign of infection no palpable fluctuance  Over his proximal tibia there is an ulceration this is 1 cm in diameter there is flat pink tissue there is no depth no drainage no sign of infection  Visit Diagnoses:  1. Dehiscence of amputation stump ([place])   2. S/P BKA (below knee amputation) unilateral, right ([place])     Plan: Discussed need for compression as well as wound care.  The patient and wife and are in agreement with the plan we will proceed with an Unna boot as well as a Dynaflex compression wrap today.  We will place a dry dressing over the incision as well as a Prisma dressing to his anterior shin ulcer.  Feel the shin ulcer will heal uneventfully  Follow-Up Instructions: Return in about 5 days (around 02/09/2019).   Imaging: No  results found.  Orders:  No orders of the defined types were placed in this encounter.  No orders of the defined types were placed in this encounter.    PMFS History: Patient Active Problem List   Diagnosis Date Noted  . Dehiscence of amputation stump ([place])   . Wound infection 05/09/2018  . AKI (acute kidney injury) ([place])   . S/P BKA (below knee amputation) unilateral, right ([place])   . Below-knee amputation with complication, initial encounter ([place])   . Tachypnea   . Post-operative pain   . Subacute osteomyelitis, right ankle and foot ([place])   . Paralysis of right lower extremity ([place])   . TIA (transient ischemic attack) 03/24/2018  . Encephalopathy 03/24/2018  . Cellulitis 03/15/2018  . Hypernatremia 03/15/2018  . Persistent atrial fibrillation ([place]) 11/10/2017  . Severe muscle deconditioning 11/10/2017  . Hemorrhage 10/22/2017  . CAD S/P percutaneous coronary angioplasty 10/22/2017  . Acute hypoxemic respiratory failure ([place]) 07/17/2017  . Aspiration syndrome, subsequent encounter 07/11/2017  . Aspiration pneumonia ([place]) 07/01/2017  . Acute encephalopathy 07/01/2017  . Thrombocytopenia ([place]) 07/01/2017  . Chronic diastolic (congestive) heart failure ([place]) 07/01/2017  . Anemia of chronic disease 07/01/2017  . Troponin level elevated 07/01/2017  . Chronic atrial fibrillation 07/01/2017  . Goals of care, counseling/discussion   . Palliative care by specialist   .  Acute metabolic encephalopathy 123456  . CKD (chronic kidney disease) stage 3, GFR 30-59 ml/min   . CAP (community acquired pneumonia) 04/17/2017  . Hallux rigidus, right foot 03/10/2016  . Lumbosacral spondylosis without myelopathy 10/29/2015  . Memory difficulty 09/22/2015  . Abnormality of gait 09/22/2015  . Hyperglycemia 07/06/2015  . Chronic pain 07/06/2015  . Chronic insomnia 03/30/2015  . Transient alteration of awareness 03/30/2015  . Abnormal liver function   . Altered mental status 01/31/2015   . Essential hypertension 01/31/2015  . Constipation 01/31/2015  . Hypothyroidism 01/31/2015  . Seizure disorder ([place]) 01/31/2015  . Bladder outlet obstruction 01/31/2015  . GERD (gastroesophageal reflux disease) 01/31/2015  . Chronic back pain 01/31/2015  . Acute kidney injury ([place]) 01/31/2015   Past Medical History:  Diagnosis Date  . Abnormality of gait 09/22/2015  . Arthritis   . Atrial fibrillation ([place])   . CAD (coronary artery disease)    Stent to RCA, Penta stent, 99% reduced to 0% 2002.  . Cancer ([place])    skin CA removed from back  . Chronic insomnia 03/30/2015  . Complication of anesthesia    trouble waking up  . GERD (gastroesophageal reflux disease)   . Hypercholesteremia   . Hypertension   . Hypothyroidism   . Memory difficulty 09/22/2015  . Osteoarthritis   . Pneumonia   . Seizures ([place])   . Sepsis ([place]) 05/2017  . Transient alteration of awareness 03/30/2015  . Vertigo    hx of    Family History  Problem Relation Age of Onset  . Hypertension Mother   . Cancer Mother   . Kidney failure Father   . Heart disease Father     Past Surgical History:  Procedure Laterality Date  . AMPUTATION Right 03/28/2018   Procedure: AMPUTATION BELOW KNEE;  Surgeon: Newt Minion, MD;  Location: [facility];  Service: Orthopedics;  Laterality: Right;  . APPLICATION OF WOUND VAC Right 01/23/2019   Procedure: Application Of  Prevena Wound Vac;  Surgeon: Newt Minion, MD;  Location: [facility];  Service: Orthopedics;  Laterality: Right;  . BACK SURGERY    . EYE SURGERY     Bilateral Cataract surgery   . HERNIA REPAIR    . I&D EXTREMITY Right 05/10/2015   Procedure: IRRIGATION AND DEBRIDEMENT EXTREMITY;  Surgeon: Leanora Cover, MD;  Location: [facility];  Service: Orthopedics;  Laterality: Right;  . KNEE ARTHROPLASTY     right knee X 2; left knee once  . LAMINECTOMY     X 6  . LEG AMPUTATION BELOW KNEE Right 03/28/2018  . POSTERIOR CERVICAL FUSION/FORAMINOTOMY   01/28/2012   Procedure: POSTERIOR CERVICAL FUSION/FORAMINOTOMY LEVEL 3;  Surgeon: [name], MD;  Location: [facility];  Service: Neurosurgery;  Laterality: Left;  Posterior Cervical Five-Thoracic One Fusion, Arthrodesis with LEFT Cervical Seven-thoracic One Laminectomy, Foraminotomy and Resection of Synovial Cyst  . POSTERIOR CERVICAL FUSION/FORAMINOTOMY N/A 01/29/2013   Procedure: POSTERIOR CERVICAL FUSION/FORAMINOTOMY LEVEL 1 and C2-5 Posteriolateral Arthrodesis;  Surgeon: [name], MD;  Location: [facility];  Service: Neurosurgery;  Laterality: N/A;  C2-C3 Laminectomy C2-C3 posterior cervical arthrodesis  . STUMP REVISION Right 01/23/2019   Procedure: REVISION RIGHT BELOW KNEE AMPUTATION;  Surgeon: Newt Minion, MD;  Location: [facility];  Service: Orthopedics;  Laterality: Right;  . TONSILLECTOMY     Social History   Occupational History  . Occupation: retired Software engineer  Tobacco Use  . Smoking status: Former Research scientist (life sciences)  . Smokeless tobacco:  Never Used  Substance and Sexual Activity  . Alcohol use: Not Currently    Comment: rare  . Drug use: No  . Sexual activity: Not Currently

## 2019-02-09 ENCOUNTER — Encounter: Payer: Self-pay | Admitting: Physician Assistant

## 2019-02-09 ENCOUNTER — Other Ambulatory Visit: Payer: Self-pay

## 2019-02-09 ENCOUNTER — Ambulatory Visit (INDEPENDENT_AMBULATORY_CARE_PROVIDER_SITE_OTHER): Payer: Medicare Other | Admitting: Orthopedic Surgery

## 2019-02-09 VITALS — Ht 69.0 in | Wt 150.0 lb

## 2019-02-09 DIAGNOSIS — T8781 Dehiscence of amputation stump: Secondary | ICD-10-CM

## 2019-02-09 DIAGNOSIS — Z89511 Acquired absence of right leg below knee: Secondary | ICD-10-CM

## 2019-02-10 ENCOUNTER — Encounter: Payer: Self-pay | Admitting: Orthopedic Surgery

## 2019-02-10 NOTE — Progress Notes (Signed)
Office Visit Note   Patient: [name]           Date of Birth: [birth year]           MRN: [id number] Visit Date: 02/09/2019              Requested by: [name], [place],  [place] [id number] PCP: [name], MD  Chief Complaint  Patient presents with  . Right Leg - Routine Post Op    01/23/19 right BKA revision       HPI: Patient is an 85 year old gentleman who presents 2 weeks status post revision right transtibial amputation he is currently using Prisma and a compression wrap.  Assessment & Plan: Visit Diagnoses:  1. Dehiscence of amputation stump ([place])   2. S/P BKA (below knee amputation) unilateral, right ([place])     Plan: Continue with wound care a 3 extra-large shrinker was applied folded down 4 finger breaths patient is scheduled for follow-up later this week and we will reevaluate at that time.  Follow-Up Instructions: Return in about 1 week (around 02/16/2019).   Ortho Exam  Patient is alert, oriented, no adenopathy, well-dressed, normal affect, normal respiratory effort. Examination patient shows interval healing there is a small superficial blister over the tibial tubercle which is stable the incision has a small scab the wound edges are well approximated there is no cellulitis no drainage no odor no signs of infection.  Imaging: No results found. No images are attached to the encounter.  Labs: Lab Results  Component Value Date   HGBA1C 5.7 (H) 03/26/2018   HGBA1C 5.0 07/12/2017   HGBA1C 5.8 (H) 07/06/2015   ESRSEDRATE 24 (H) 03/24/2018   ESRSEDRATE 15 04/18/2017   ESRSEDRATE 5 09/17/2007   CRP 3.1 (H) 05/11/2018   CRP 1.8 (H) 03/24/2018   CRP <0.8 04/24/2017   LABURIC 6.0 03/09/2016   REPTSTATUS 12/22/2018 FINAL 12/20/2018   GRAMSTAIN  07/15/2017    WBC PRESENT,BOTH PMN AND MONONUCLEAR NO ORGANISMS SEEN Performed at [facility], [place] [address], [place], [place] [address]    CULT >=100,000 COLONIES/mL  ESCHERICHIA COLI (A) 12/20/2018   LABORGA ESCHERICHIA COLI (A) 12/20/2018     Lab Results  Component Value Date   ALBUMIN 3.6 12/20/2018   ALBUMIN 3.0 (L) 05/09/2018   ALBUMIN 2.5 (L) 03/31/2018   PREALBUMIN 36 (H) 11/22/2017   PREALBUMIN 28.8 04/24/2017   LABURIC 6.0 03/09/2016    Lab Results  Component Value Date   MG 2.0 05/12/2018   MG 1.9 05/11/2018   MG 1.9 03/31/2018   No results found for: VD25OH  Lab Results  Component Value Date   PREALBUMIN 36 (H) 11/22/2017   PREALBUMIN 28.8 04/24/2017   CBC EXTENDED Latest Ref Rng & Units 01/23/2019 12/20/2018 11/08/2018  WBC 4.0 - 10.5 K/uL 7.3 11.3(H) 9.6  RBC 4.22 - 5.81 MIL/uL 3.80(L) 3.80(L) 3.29(L)  HGB 13.0 - 17.0 g/dL 12.4(L) 12.5(L) 10.9(L)  HCT 39.0 - 52.0 % 41.2 40.0 34.6(L)  PLT 150 - 400 K/uL 135(L) 132(L) 128(L)  NEUTROABS 1.7 - 7.7 K/uL - 9.6(H) 7.8(H)  LYMPHSABS 0.7 - 4.0 K/uL - 0.6(L) 0.7     Body mass index is 22.15 kg/m.  Orders:  No orders of the defined types were placed in this encounter.  No orders of the defined types were placed in this encounter.    Procedures: No procedures performed  Clinical Data: No additional findings.  ROS:  All other systems negative,  except as noted in the HPI. Review of Systems  Objective: Vital Signs: Ht 5\' 9"  (1.753 m)   Wt 150 lb (68 kg)   BMI 22.15 kg/m   Specialty Comments:  No specialty comments available.  PMFS History: Patient Active Problem List   Diagnosis Date Noted  . Dehiscence of amputation stump ([place])   . Wound infection 05/09/2018  . AKI (acute kidney injury) ([place])   . S/P BKA (below knee amputation) unilateral, right ([place])   . Below-knee amputation with complication, initial encounter ([place])   . Tachypnea   . Post-operative pain   . Subacute osteomyelitis, right ankle and foot ([place])   . Paralysis of right lower extremity ([place])   . TIA (transient ischemic attack) 03/24/2018  . Encephalopathy 03/24/2018  . Cellulitis  03/15/2018  . Hypernatremia 03/15/2018  . Persistent atrial fibrillation ([place]) 11/10/2017  . Severe muscle deconditioning 11/10/2017  . Hemorrhage 10/22/2017  . CAD S/P percutaneous coronary angioplasty 10/22/2017  . Acute hypoxemic respiratory failure ([place]) 07/17/2017  . Aspiration syndrome, subsequent encounter 07/11/2017  . Aspiration pneumonia ([place]) 07/01/2017  . Acute encephalopathy 07/01/2017  . Thrombocytopenia ([place]) 07/01/2017  . Chronic diastolic (congestive) heart failure ([place]) 07/01/2017  . Anemia of chronic disease 07/01/2017  . Troponin level elevated 07/01/2017  . Chronic atrial fibrillation 07/01/2017  . Goals of care, counseling/discussion   . Palliative care by specialist   . Acute metabolic encephalopathy 123456  . CKD (chronic kidney disease) stage 3, GFR 30-59 ml/min   . CAP (community acquired pneumonia) 04/17/2017  . Hallux rigidus, right foot 03/10/2016  . Lumbosacral spondylosis without myelopathy 10/29/2015  . Memory difficulty 09/22/2015  . Abnormality of gait 09/22/2015  . Hyperglycemia 07/06/2015  . Chronic pain 07/06/2015  . Chronic insomnia 03/30/2015  . Transient alteration of awareness 03/30/2015  . Abnormal liver function   . Altered mental status 01/31/2015  . Essential hypertension 01/31/2015  . Constipation 01/31/2015  . Hypothyroidism 01/31/2015  . Seizure disorder ([place]) 01/31/2015  . Bladder outlet obstruction 01/31/2015  . GERD (gastroesophageal reflux disease) 01/31/2015  . Chronic back pain 01/31/2015  . Acute kidney injury ([place]) 01/31/2015   Past Medical History:  Diagnosis Date  . Abnormality of gait 09/22/2015  . Arthritis   . Atrial fibrillation ([place])   . CAD (coronary artery disease)    Stent to RCA, Penta stent, 99% reduced to 0% 2002.  . Cancer ([place])    skin CA removed from back  . Chronic insomnia 03/30/2015  . Complication of anesthesia    trouble waking up  . GERD (gastroesophageal reflux disease)   .  Hypercholesteremia   . Hypertension   . Hypothyroidism   . Memory difficulty 09/22/2015  . Osteoarthritis   . Pneumonia   . Seizures ([place])   . Sepsis ([place]) 05/2017  . Transient alteration of awareness 03/30/2015  . Vertigo    hx of    Family History  Problem Relation Age of Onset  . Hypertension Mother   . Cancer Mother   . Kidney failure Father   . Heart disease Father     Past Surgical History:  Procedure Laterality Date  . AMPUTATION Right 03/28/2018   Procedure: AMPUTATION BELOW KNEE;  Surgeon: Newt Minion, MD;  Location: [facility];  Service: Orthopedics;  Laterality: Right;  . APPLICATION OF WOUND VAC Right 01/23/2019   Procedure: Application Of  Prevena Wound Vac;  Surgeon: Newt Minion, MD;  Location: [facility];  Service: Orthopedics;  Laterality: Right;  .  BACK SURGERY    . EYE SURGERY     Bilateral Cataract surgery   . HERNIA REPAIR    . I&D EXTREMITY Right 05/10/2015   Procedure: IRRIGATION AND DEBRIDEMENT EXTREMITY;  Surgeon: Leanora Cover, MD;  Location: [facility];  Service: Orthopedics;  Laterality: Right;  . KNEE ARTHROPLASTY     right knee X 2; left knee once  . LAMINECTOMY     X 6  . LEG AMPUTATION BELOW KNEE Right 03/28/2018  . POSTERIOR CERVICAL FUSION/FORAMINOTOMY  01/28/2012   Procedure: POSTERIOR CERVICAL FUSION/FORAMINOTOMY LEVEL 3;  Surgeon: [name], MD;  Location: [facility];  Service: Neurosurgery;  Laterality: Left;  Posterior Cervical Five-Thoracic One Fusion, Arthrodesis with LEFT Cervical Seven-thoracic One Laminectomy, Foraminotomy and Resection of Synovial Cyst  . POSTERIOR CERVICAL FUSION/FORAMINOTOMY N/A 01/29/2013   Procedure: POSTERIOR CERVICAL FUSION/FORAMINOTOMY LEVEL 1 and C2-5 Posteriolateral Arthrodesis;  Surgeon: [name], MD;  Location: [facility];  Service: Neurosurgery;  Laterality: N/A;  C2-C3 Laminectomy C2-C3 posterior cervical arthrodesis  . STUMP REVISION Right 01/23/2019   Procedure: REVISION  RIGHT BELOW KNEE AMPUTATION;  Surgeon: Newt Minion, MD;  Location: [facility];  Service: Orthopedics;  Laterality: Right;  . TONSILLECTOMY     Social History   Occupational History  . Occupation: retired Software engineer  Tobacco Use  . Smoking status: Former Research scientist (life sciences)  . Smokeless tobacco: Never Used  Substance and Sexual Activity  . Alcohol use: Not Currently    Comment: rare  . Drug use: No  . Sexual activity: Not Currently

## 2019-02-10 NOTE — Progress Notes (Signed)
Office Visit Note   Patient: [name]           Date of Birth: [birth year]           MRN: [id number] Visit Date: 01/29/2019              Requested by: [name], [place],  [place] [id number] PCP: [name], MD  Chief Complaint  Patient presents with  . Right Leg - Routine Post Op    01/23/2019 Right Revision BKA      HPI: Patient is an 85 year old gentleman status post revision right transtibial amputation 1 week out from surgery.  Assessment & Plan: Visit Diagnoses:  1. S/P BKA (below knee amputation) unilateral, right ([place])   2. Dehiscence of amputation stump ([place])     Plan: The VAC is removed we will start with dry dressing changes follow-up next week  Follow-Up Instructions: Return in about 1 week (around 02/05/2019).   Ortho Exam  Patient is alert, oriented, no adenopathy, well-dressed, normal affect, normal respiratory effort. Examination the wound VAC is removed there is some bleeding there is some mild ischemic changes over the tibial tubercle.  The skin edges are intact with bleeding.  Imaging: No results found. No images are attached to the encounter.  Labs: Lab Results  Component Value Date   HGBA1C 5.7 (H) 03/26/2018   HGBA1C 5.0 07/12/2017   HGBA1C 5.8 (H) 07/06/2015   ESRSEDRATE 24 (H) 03/24/2018   ESRSEDRATE 15 04/18/2017   ESRSEDRATE 5 09/17/2007   CRP 3.1 (H) 05/11/2018   CRP 1.8 (H) 03/24/2018   CRP <0.8 04/24/2017   LABURIC 6.0 03/09/2016   REPTSTATUS 12/22/2018 FINAL 12/20/2018   GRAMSTAIN  07/15/2017    WBC PRESENT,BOTH PMN AND MONONUCLEAR NO ORGANISMS SEEN Performed at [facility], [place] [address] [place], [place] [address]    CULT >=100,000 COLONIES/mL ESCHERICHIA COLI (A) 12/20/2018   LABORGA ESCHERICHIA COLI (A) 12/20/2018     Lab Results  Component Value Date   ALBUMIN 3.6 12/20/2018   ALBUMIN 3.0 (L) 05/09/2018   ALBUMIN 2.5 (L) 03/31/2018   PREALBUMIN 36 (H) 11/22/2017   PREALBUMIN 28.8 04/24/2017   LABURIC 6.0 03/09/2016    Lab Results  Component Value Date   MG 2.0 05/12/2018   MG 1.9 05/11/2018   MG 1.9 03/31/2018   No results found for: VD25OH  Lab Results  Component Value Date   PREALBUMIN 36 (H) 11/22/2017   PREALBUMIN 28.8 04/24/2017   CBC EXTENDED Latest Ref Rng & Units 01/23/2019 12/20/2018 11/08/2018  WBC 4.0 - 10.5 K/uL 7.3 11.3(H) 9.6  RBC 4.22 - 5.81 MIL/uL 3.80(L) 3.80(L) 3.29(L)  HGB 13.0 - 17.0 g/dL 12.4(L) 12.5(L) 10.9(L)  HCT 39.0 - 52.0 % 41.2 40.0 34.6(L)  PLT 150 - 400 K/uL 135(L) 132(L) 128(L)  NEUTROABS 1.7 - 7.7 K/uL - 9.6(H) 7.8(H)  LYMPHSABS 0.7 - 4.0 K/uL - 0.6(L) 0.7     Body mass index is 22.15 kg/m.  Orders:  No orders of the defined types were placed in this encounter.  No orders of the defined types were placed in this encounter.    Procedures: No procedures performed  Clinical Data: No additional findings.  ROS:  All other systems negative, except as noted in the HPI. Review of Systems  Objective: Vital Signs: Ht 5\' 9"  (1.753 m)   Wt 150 lb (68 kg)   BMI 22.15 kg/m   Specialty Comments:  No specialty comments available.  PMFS History: Patient Active Problem List   Diagnosis Date Noted  . Dehiscence of amputation stump ([place])   . Wound infection 05/09/2018  . AKI (acute kidney injury) ([place])   . S/P BKA (below knee amputation) unilateral, right ([place])   . Below-knee amputation with complication, initial encounter ([place])   . Tachypnea   . Post-operative pain   . Subacute osteomyelitis, right ankle and foot ([place])   . Paralysis of right lower extremity ([place])   . TIA (transient ischemic attack) 03/24/2018  . Encephalopathy 03/24/2018  . Cellulitis 03/15/2018  . Hypernatremia 03/15/2018  . Persistent atrial fibrillation ([place]) 11/10/2017  . Severe muscle deconditioning 11/10/2017  . Hemorrhage 10/22/2017  . CAD S/P percutaneous coronary angioplasty 10/22/2017  . Acute hypoxemic  respiratory failure ([place]) 07/17/2017  . Aspiration syndrome, subsequent encounter 07/11/2017  . Aspiration pneumonia ([place]) 07/01/2017  . Acute encephalopathy 07/01/2017  . Thrombocytopenia ([place]) 07/01/2017  . Chronic diastolic (congestive) heart failure ([place]) 07/01/2017  . Anemia of chronic disease 07/01/2017  . Troponin level elevated 07/01/2017  . Chronic atrial fibrillation 07/01/2017  . Goals of care, counseling/discussion   . Palliative care by specialist   . Acute metabolic encephalopathy 123456  . CKD (chronic kidney disease) stage 3, GFR 30-59 ml/min   . CAP (community acquired pneumonia) 04/17/2017  . Hallux rigidus, right foot 03/10/2016  . Lumbosacral spondylosis without myelopathy 10/29/2015  . Memory difficulty 09/22/2015  . Abnormality of gait 09/22/2015  . Hyperglycemia 07/06/2015  . Chronic pain 07/06/2015  . Chronic insomnia 03/30/2015  . Transient alteration of awareness 03/30/2015  . Abnormal liver function   . Altered mental status 01/31/2015  . Essential hypertension 01/31/2015  . Constipation 01/31/2015  . Hypothyroidism 01/31/2015  . Seizure disorder ([name]) 01/31/2015  . Bladder outlet obstruction 01/31/2015  . GERD (gastroesophageal reflux disease) 01/31/2015  . Chronic back pain 01/31/2015  . Acute kidney injury ([place]) 01/31/2015   Past Medical History:  Diagnosis Date  . Abnormality of gait 09/22/2015  . Arthritis   . Atrial fibrillation ([place])   . CAD (coronary artery disease)    Stent to RCA, Penta stent, 99% reduced to 0% 2002.  . Cancer ([place])    skin CA removed from back  . Chronic insomnia 03/30/2015  . Complication of anesthesia    trouble waking up  . GERD (gastroesophageal reflux disease)   . Hypercholesteremia   . Hypertension   . Hypothyroidism   . Memory difficulty 09/22/2015  . Osteoarthritis   . Pneumonia   . Seizures ([place])   . Sepsis ([place]) 05/2017  . Transient alteration of awareness 03/30/2015  . Vertigo    hx of     Family History  Problem Relation Age of Onset  . Hypertension Mother   . Cancer Mother   . Kidney failure Father   . Heart disease Father     Past Surgical History:  Procedure Laterality Date  . AMPUTATION Right 03/28/2018   Procedure: AMPUTATION BELOW KNEE;  Surgeon: Newt Minion, MD;  Location: [facility];  Service: Orthopedics;  Laterality: Right;  . APPLICATION OF WOUND VAC Right 01/23/2019   Procedure: Application Of  Prevena Wound Vac;  Surgeon: Newt Minion, MD;  Location: [facility];  Service: Orthopedics;  Laterality: Right;  . BACK SURGERY    . EYE SURGERY     Bilateral Cataract surgery   . HERNIA REPAIR    . I&D EXTREMITY Right 05/10/2015   Procedure: IRRIGATION AND DEBRIDEMENT EXTREMITY;  Surgeon: Leanora Cover,  MD;  Location: [facility];  Service: Orthopedics;  Laterality: Right;  . KNEE ARTHROPLASTY     right knee X 2; left knee once  . LAMINECTOMY     X 6  . LEG AMPUTATION BELOW KNEE Right 03/28/2018  . POSTERIOR CERVICAL FUSION/FORAMINOTOMY  01/28/2012   Procedure: POSTERIOR CERVICAL FUSION/FORAMINOTOMY LEVEL 3;  Surgeon: [name], MD;  Location: [facility];  Service: Neurosurgery;  Laterality: Left;  Posterior Cervical Five-Thoracic One Fusion, Arthrodesis with LEFT Cervical Seven-thoracic One Laminectomy, Foraminotomy and Resection of Synovial Cyst  . POSTERIOR CERVICAL FUSION/FORAMINOTOMY N/A 01/29/2013   Procedure: POSTERIOR CERVICAL FUSION/FORAMINOTOMY LEVEL 1 and C2-5 Posteriolateral Arthrodesis;  Surgeon: [name], MD;  Location: [facility];  Service: Neurosurgery;  Laterality: N/A;  C2-C3 Laminectomy C2-C3 posterior cervical arthrodesis  . STUMP REVISION Right 01/23/2019   Procedure: REVISION RIGHT BELOW KNEE AMPUTATION;  Surgeon: Newt Minion, MD;  Location: [facility];  Service: Orthopedics;  Laterality: Right;  . TONSILLECTOMY     Social History   Occupational History  . Occupation: retired Software engineer  Tobacco Use  . Smoking  status: Former Research scientist (life sciences)  . Smokeless tobacco: Never Used  Substance and Sexual Activity  . Alcohol use: Not Currently    Comment: rare  . Drug use: No  . Sexual activity: Not Currently

## 2019-02-11 ENCOUNTER — Other Ambulatory Visit: Payer: Self-pay

## 2019-02-11 ENCOUNTER — Encounter: Payer: Self-pay | Admitting: Physician Assistant

## 2019-02-11 ENCOUNTER — Ambulatory Visit (INDEPENDENT_AMBULATORY_CARE_PROVIDER_SITE_OTHER): Payer: Medicare Other | Admitting: Physician Assistant

## 2019-02-11 VITALS — Ht 69.0 in | Wt 150.0 lb

## 2019-02-11 DIAGNOSIS — T8781 Dehiscence of amputation stump: Secondary | ICD-10-CM

## 2019-02-11 NOTE — Progress Notes (Signed)
Office Visit Note   Patient: [name]           Date of Birth: 1934-02-27           MRN: [id number] Visit Date: 02/11/2019              Requested by: [name], [place],  [place] [address] PCP: [name], MD  Chief Complaint  Patient presents with  . Right Leg - Routine Post Op    01/23/2019 right BKA revision      HPI: Pleasant 85 year old gentleman here for dressing change. He is 12 days s/p Right BKA. Having some neuropathic type pain on the lateral side of the stump  Assessment & Plan: Visit Diagnoses: No diagnosis found.  Plan: I have offered neurontin for night time neuropathic pain and he has declined this at this time. He will follow up on friday  Follow-Up Instructions: No follow-ups on file.   Ortho Exam  Patient is alert, oriented, no adenopathy, well-dressed, normal affect, normal respiratory effort. Right  BKA stump is healing well. No Cellulitis. Incision is healing with healthy  wound edges no evidence of dehiscence. Anterior shin scab is healing and covered with a very small scab. No erythema. 3xl shrinker was applied   Imaging: No results found. No images are attached to the encounter.  Labs: Lab Results  Component Value Date   HGBA1C 5.7 (H) 03/26/2018   HGBA1C 5.0 07/12/2017   HGBA1C 5.8 (H) 07/06/2015   ESRSEDRATE 24 (H) 03/24/2018   ESRSEDRATE 15 04/18/2017   ESRSEDRATE 5 09/17/2007   CRP 3.1 (H) 05/11/2018   CRP 1.8 (H) 03/24/2018   CRP <0.8 04/24/2017   LABURIC 6.0 03/09/2016   REPTSTATUS 12/22/2018 FINAL 12/20/2018   GRAMSTAIN  07/15/2017    WBC PRESENT,BOTH PMN AND MONONUCLEAR NO ORGANISMS SEEN Performed at [facility], [place] [address], [place], [place] [address]    CULT >=100,000 COLONIES/mL ESCHERICHIA COLI (A) 12/20/2018   LABORGA ESCHERICHIA COLI (A) 12/20/2018     Lab Results  Component Value Date   ALBUMIN 3.6 12/20/2018   ALBUMIN 3.0 (L) 05/09/2018   ALBUMIN 2.5 (L) 03/31/2018    PREALBUMIN 36 (H) 11/22/2017   PREALBUMIN 28.8 04/24/2017   LABURIC 6.0 03/09/2016    Lab Results  Component Value Date   MG 2.0 05/12/2018   MG 1.9 05/11/2018   MG 1.9 03/31/2018   No results found for: VD25OH  Lab Results  Component Value Date   PREALBUMIN 36 (H) 11/22/2017   PREALBUMIN 28.8 04/24/2017   CBC EXTENDED Latest Ref Rng & Units 01/23/2019 12/20/2018 11/08/2018  WBC 4.0 - 10.5 K/uL 7.3 11.3(H) 9.6  RBC 4.22 - 5.81 MIL/uL 3.80(L) 3.80(L) 3.29(L)  HGB 13.0 - 17.0 g/dL 12.4(L) 12.5(L) 10.9(L)  HCT 39.0 - 52.0 % 41.2 40.0 34.6(L)  PLT 150 - 400 K/uL 135(L) 132(L) 128(L)  NEUTROABS 1.7 - 7.7 K/uL - 9.6(H) 7.8(H)  LYMPHSABS 0.7 - 4.0 K/uL - 0.6(L) 0.7     Body mass index is 22.15 kg/m.  Orders:  No orders of the defined types were placed in this encounter.  No orders of the defined types were placed in this encounter.    Procedures: No procedures performed  Clinical Data: No additional findings.  ROS:  All other systems negative, except as noted in the HPI. Review of Systems  Objective: Vital Signs: Ht 5\' 9"  (1.753 m)   Wt 150 lb (68 kg)  BMI 22.15 kg/m   Specialty Comments:  No specialty comments available.  PMFS History: Patient Active Problem List   Diagnosis Date Noted  . Dehiscence of amputation stump ([place])   . Wound infection 05/09/2018  . AKI (acute kidney injury) ([place])   . S/P BKA (below knee amputation) unilateral, right ([place])   . Below-knee amputation with complication, initial encounter ([place])   . Tachypnea   . Post-operative pain   . Subacute osteomyelitis, right ankle and foot ([place])   . Paralysis of right lower extremity ([place])   . TIA (transient ischemic attack) 03/24/2018  . Encephalopathy 03/24/2018  . Cellulitis 03/15/2018  . Hypernatremia 03/15/2018  . Persistent atrial fibrillation ([place]) 11/10/2017  . Severe muscle deconditioning 11/10/2017  . Hemorrhage 10/22/2017  . CAD S/P percutaneous coronary angioplasty  10/22/2017  . Acute hypoxemic respiratory failure ([place]) 07/17/2017  . Aspiration syndrome, subsequent encounter 07/11/2017  . Aspiration pneumonia ([place]) 07/01/2017  . Acute encephalopathy 07/01/2017  . Thrombocytopenia ([place]) 07/01/2017  . Chronic diastolic (congestive) heart failure ([place]) 07/01/2017  . Anemia of chronic disease 07/01/2017  . Troponin level elevated 07/01/2017  . Chronic atrial fibrillation 07/01/2017  . Goals of care, counseling/discussion   . Palliative care by specialist   . Acute metabolic encephalopathy 123456  . CKD (chronic kidney disease) stage 3, GFR 30-59 ml/min   . CAP (community acquired pneumonia) 04/17/2017  . Hallux rigidus, right foot 03/10/2016  . Lumbosacral spondylosis without myelopathy 10/29/2015  . Memory difficulty 09/22/2015  . Abnormality of gait 09/22/2015  . Hyperglycemia 07/06/2015  . Chronic pain 07/06/2015  . Chronic insomnia 03/30/2015  . Transient alteration of awareness 03/30/2015  . Abnormal liver function   . Altered mental status 01/31/2015  . Essential hypertension 01/31/2015  . Constipation 01/31/2015  . Hypothyroidism 01/31/2015  . Seizure disorder ([place]) 01/31/2015  . Bladder outlet obstruction 01/31/2015  . GERD (gastroesophageal reflux disease) 01/31/2015  . Chronic back pain 01/31/2015  . Acute kidney injury ([place]) 01/31/2015   Past Medical History:  Diagnosis Date  . Abnormality of gait 09/22/2015  . Arthritis   . Atrial fibrillation ([place])   . CAD (coronary artery disease)    Stent to RCA, Penta stent, 99% reduced to 0% 2002.  . Cancer ([place])    skin CA removed from back  . Chronic insomnia 03/30/2015  . Complication of anesthesia    trouble waking up  . GERD (gastroesophageal reflux disease)   . Hypercholesteremia   . Hypertension   . Hypothyroidism   . Memory difficulty 09/22/2015  . Osteoarthritis   . Pneumonia   . Seizures ([place])   . Sepsis ([place]) 05/2017  . Transient alteration of awareness 03/30/2015   . Vertigo    hx of    Family History  Problem Relation Age of Onset  . Hypertension Mother   . Cancer Mother   . Kidney failure Father   . Heart disease Father     Past Surgical History:  Procedure Laterality Date  . AMPUTATION Right 03/28/2018   Procedure: AMPUTATION BELOW KNEE;  Surgeon: Newt Minion, MD;  Location: [facility];  Service: Orthopedics;  Laterality: Right;  . APPLICATION OF WOUND VAC Right 01/23/2019   Procedure: Application Of  Prevena Wound Vac;  Surgeon: Newt Minion, MD;  Location: [facility];  Service: Orthopedics;  Laterality: Right;  . BACK SURGERY    . EYE SURGERY     Bilateral Cataract surgery   . HERNIA REPAIR    . I&D EXTREMITY  Right 05/10/2015   Procedure: IRRIGATION AND DEBRIDEMENT EXTREMITY;  Surgeon: Leanora Cover, MD;  Location: [facility];  Service: Orthopedics;  Laterality: Right;  . KNEE ARTHROPLASTY     right knee X 2; left knee once  . LAMINECTOMY     X 6  . LEG AMPUTATION BELOW KNEE Right 03/28/2018  . POSTERIOR CERVICAL FUSION/FORAMINOTOMY  01/28/2012   Procedure: POSTERIOR CERVICAL FUSION/FORAMINOTOMY LEVEL 3;  Surgeon: [name], MD;  Location: [facility];  Service: Neurosurgery;  Laterality: Left;  Posterior Cervical Five-Thoracic One Fusion, Arthrodesis with LEFT Cervical Seven-thoracic One Laminectomy, Foraminotomy and Resection of Synovial Cyst  . POSTERIOR CERVICAL FUSION/FORAMINOTOMY N/A 01/29/2013   Procedure: POSTERIOR CERVICAL FUSION/FORAMINOTOMY LEVEL 1 and C2-5 Posteriolateral Arthrodesis;  Surgeon: [name], MD;  Location: [facility];  Service: Neurosurgery;  Laterality: N/A;  C2-C3 Laminectomy C2-C3 posterior cervical arthrodesis  . STUMP REVISION Right 01/23/2019   Procedure: REVISION RIGHT BELOW KNEE AMPUTATION;  Surgeon: Newt Minion, MD;  Location: [facility];  Service: Orthopedics;  Laterality: Right;  . TONSILLECTOMY     Social History   Occupational History  . Occupation: retired Software engineer   Tobacco Use  . Smoking status: Former Research scientist (life sciences)  . Smokeless tobacco: Never Used  Substance and Sexual Activity  . Alcohol use: Not Currently    Comment: rare  . Drug use: No  . Sexual activity: Not Currently

## 2019-02-13 ENCOUNTER — Encounter: Payer: Self-pay | Admitting: Family

## 2019-02-13 ENCOUNTER — Other Ambulatory Visit: Payer: Self-pay

## 2019-02-13 ENCOUNTER — Ambulatory Visit (INDEPENDENT_AMBULATORY_CARE_PROVIDER_SITE_OTHER): Payer: Medicare Other | Admitting: Family

## 2019-02-13 ENCOUNTER — Telehealth: Payer: Self-pay | Admitting: Orthopedic Surgery

## 2019-02-13 VITALS — Ht 69.0 in | Wt 150.0 lb

## 2019-02-13 DIAGNOSIS — Z89511 Acquired absence of right leg below knee: Secondary | ICD-10-CM

## 2019-02-13 NOTE — Telephone Encounter (Signed)
Please call her to r/s the December 14th appointment for another day.  CB#[phone].  Thank you.

## 2019-02-13 NOTE — Progress Notes (Signed)
Office Visit Note   Patient: [name]           Date of Birth: 1934-01-06           MRN: [id number] Visit Date: 02/13/2019              Requested by: [name], [place],  [place] [address] PCP: [name], MD  Chief Complaint  Patient presents with  . Right Leg - Routine Post Op    01/23/2019 right BKA revision      HPI: Pleasant 85 year old gentleman here for dressing change. He is 12 days s/p Right BKA. Having some neuropathic type pain on the lateral side of the stump  Assessment & Plan: Visit Diagnoses: No diagnosis found.  Plan: Wife is uncomfortable removing sutures today.  Wife would like to follow-up next week with Dr. [name] prior to suture removal.  Follow-Up Instructions: No follow-ups on file.   Ortho Exam  Patient is alert, oriented, no adenopathy, well-dressed, normal affect, normal respiratory effort. Right  BKA stump is healing well. No Cellulitis. Incision is well-healed sans an area 1 cm in the center of the incision this is filled with granulation 1 drop of bloody drainage there is no odor no erythema no sign of infection anterior shin scab is healing and covered with a very small scab. No erythema. 3xl shrinker was applied   Imaging: No results found. No images are attached to the encounter.  Labs: Lab Results  Component Value Date   HGBA1C 5.7 (H) 03/26/2018   HGBA1C 5.0 07/12/2017   HGBA1C 5.8 (H) 07/06/2015   ESRSEDRATE 24 (H) 03/24/2018   ESRSEDRATE 15 04/18/2017   ESRSEDRATE 5 09/17/2007   CRP 3.1 (H) 05/11/2018   CRP 1.8 (H) 03/24/2018   CRP <0.8 04/24/2017   LABURIC 6.0 03/09/2016   REPTSTATUS 12/22/2018 FINAL 12/20/2018   GRAMSTAIN  07/15/2017    WBC PRESENT,BOTH PMN AND MONONUCLEAR NO ORGANISMS SEEN Performed at [facility], [place] [address], [place], [place] [address]    CULT >=100,000 COLONIES/mL ESCHERICHIA COLI (A) 12/20/2018   LABORGA ESCHERICHIA COLI (A) 12/20/2018     Lab Results   Component Value Date   ALBUMIN 3.6 12/20/2018   ALBUMIN 3.0 (L) 05/09/2018   ALBUMIN 2.5 (L) 03/31/2018   PREALBUMIN 36 (H) 11/22/2017   PREALBUMIN 28.8 04/24/2017   LABURIC 6.0 03/09/2016    Lab Results  Component Value Date   MG 2.0 05/12/2018   MG 1.9 05/11/2018   MG 1.9 03/31/2018   No results found for: VD25OH  Lab Results  Component Value Date   PREALBUMIN 36 (H) 11/22/2017   PREALBUMIN 28.8 04/24/2017   CBC EXTENDED Latest Ref Rng & Units 01/23/2019 12/20/2018 11/08/2018  WBC 4.0 - 10.5 K/uL 7.3 11.3(H) 9.6  RBC 4.22 - 5.81 MIL/uL 3.80(L) 3.80(L) 3.29(L)  HGB 13.0 - 17.0 g/dL 12.4(L) 12.5(L) 10.9(L)  HCT 39.0 - 52.0 % 41.2 40.0 34.6(L)  PLT 150 - 400 K/uL 135(L) 132(L) 128(L)  NEUTROABS 1.7 - 7.7 K/uL - 9.6(H) 7.8(H)  LYMPHSABS 0.7 - 4.0 K/uL - 0.6(L) 0.7     Body mass index is 22.15 kg/m.  Orders:  No orders of the defined types were placed in this encounter.  No orders of the defined types were placed in this encounter.    Procedures: No procedures performed  Clinical Data: No additional findings.  ROS:  All other systems negative, except as noted in the HPI. Review  of Systems  Objective: Vital Signs: Ht 5\' 9"  (1.753 m)   Wt 150 lb (68 kg)   BMI 22.15 kg/m   Specialty Comments:  No specialty comments available.  PMFS History: Patient Active Problem List   Diagnosis Date Noted  . Dehiscence of amputation stump ([place])   . Wound infection 05/09/2018  . AKI (acute kidney injury) ([place])   . S/P BKA (below knee amputation) unilateral, right ([place])   . Below-knee amputation with complication, initial encounter ([place])   . Tachypnea   . Post-operative pain   . Subacute osteomyelitis, right ankle and foot ([place])   . Paralysis of right lower extremity ([place])   . TIA (transient ischemic attack) 03/24/2018  . Encephalopathy 03/24/2018  . Cellulitis 03/15/2018  . Hypernatremia 03/15/2018  . Persistent atrial fibrillation ([place]) 11/10/2017  . Severe  muscle deconditioning 11/10/2017  . Hemorrhage 10/22/2017  . CAD S/P percutaneous coronary angioplasty 10/22/2017  . Acute hypoxemic respiratory failure ([place]) 07/17/2017  . Aspiration syndrome, subsequent encounter 07/11/2017  . Aspiration pneumonia ([place]) 07/01/2017  . Acute encephalopathy 07/01/2017  . Thrombocytopenia ([place]) 07/01/2017  . Chronic diastolic (congestive) heart failure ([place]) 07/01/2017  . Anemia of chronic disease 07/01/2017  . Troponin level elevated 07/01/2017  . Chronic atrial fibrillation 07/01/2017  . Goals of care, counseling/discussion   . Palliative care by specialist   . Acute metabolic encephalopathy 123456  . CKD (chronic kidney disease) stage 3, GFR 30-59 ml/min   . CAP (community acquired pneumonia) 04/17/2017  . Hallux rigidus, right foot 03/10/2016  . Lumbosacral spondylosis without myelopathy 10/29/2015  . Memory difficulty 09/22/2015  . Abnormality of gait 09/22/2015  . Hyperglycemia 07/06/2015  . Chronic pain 07/06/2015  . Chronic insomnia 03/30/2015  . Transient alteration of awareness 03/30/2015  . Abnormal liver function   . Altered mental status 01/31/2015  . Essential hypertension 01/31/2015  . Constipation 01/31/2015  . Hypothyroidism 01/31/2015  . Seizure disorder ([place]) 01/31/2015  . Bladder outlet obstruction 01/31/2015  . GERD (gastroesophageal reflux disease) 01/31/2015  . Chronic back pain 01/31/2015  . Acute kidney injury ([place]) 01/31/2015   Past Medical History:  Diagnosis Date  . Abnormality of gait 09/22/2015  . Arthritis   . Atrial fibrillation ([place])   . CAD (coronary artery disease)    Stent to RCA, Penta stent, 99% reduced to 0% 2002.  . Cancer ([place])    skin CA removed from back  . Chronic insomnia 03/30/2015  . Complication of anesthesia    trouble waking up  . GERD (gastroesophageal reflux disease)   . Hypercholesteremia   . Hypertension   . Hypothyroidism   . Memory difficulty 09/22/2015  . Osteoarthritis   .  Pneumonia   . Seizures ([place])   . Sepsis ([place]) 05/2017  . Transient alteration of awareness 03/30/2015  . Vertigo    hx of    Family History  Problem Relation Age of Onset  . Hypertension Mother   . Cancer Mother   . Kidney failure Father   . Heart disease Father     Past Surgical History:  Procedure Laterality Date  . AMPUTATION Right 03/28/2018   Procedure: AMPUTATION BELOW KNEE;  Surgeon: Newt Minion, MD;  Location: [facility];  Service: Orthopedics;  Laterality: Right;  . APPLICATION OF WOUND VAC Right 01/23/2019   Procedure: Application Of  Prevena Wound Vac;  Surgeon: Newt Minion, MD;  Location: [facility];  Service: Orthopedics;  Laterality: Right;  . BACK SURGERY    .  EYE SURGERY     Bilateral Cataract surgery   . HERNIA REPAIR    . I&D EXTREMITY Right 05/10/2015   Procedure: IRRIGATION AND DEBRIDEMENT EXTREMITY;  Surgeon: Leanora Cover, MD;  Location: [facility];  Service: Orthopedics;  Laterality: Right;  . KNEE ARTHROPLASTY     right knee X 2; left knee once  . LAMINECTOMY     X 6  . LEG AMPUTATION BELOW KNEE Right 03/28/2018  . POSTERIOR CERVICAL FUSION/FORAMINOTOMY  01/28/2012   Procedure: POSTERIOR CERVICAL FUSION/FORAMINOTOMY LEVEL 3;  Surgeon: [name], MD;  Location: [facility];  Service: Neurosurgery;  Laterality: Left;  Posterior Cervical Five-Thoracic One Fusion, Arthrodesis with LEFT Cervical Seven-thoracic One Laminectomy, Foraminotomy and Resection of Synovial Cyst  . POSTERIOR CERVICAL FUSION/FORAMINOTOMY N/A 01/29/2013   Procedure: POSTERIOR CERVICAL FUSION/FORAMINOTOMY LEVEL 1 and C2-5 Posteriolateral Arthrodesis;  Surgeon: [name], MD;  Location: [facility];  Service: Neurosurgery;  Laterality: N/A;  C2-C3 Laminectomy C2-C3 posterior cervical arthrodesis  . STUMP REVISION Right 01/23/2019   Procedure: REVISION RIGHT BELOW KNEE AMPUTATION;  Surgeon: Newt Minion, MD;  Location: [facility];  Service: Orthopedics;  Laterality:  Right;  . TONSILLECTOMY     Social History   Occupational History  . Occupation: retired Software engineer  Tobacco Use  . Smoking status: Former Research scientist (life sciences)  . Smokeless tobacco: Never Used  Substance and Sexual Activity  . Alcohol use: Not Currently    Comment: rare  . Drug use: No  . Sexual activity: Not Currently

## 2019-02-16 ENCOUNTER — Ambulatory Visit (INDEPENDENT_AMBULATORY_CARE_PROVIDER_SITE_OTHER): Payer: Medicare Other | Admitting: Orthopedic Surgery

## 2019-02-16 ENCOUNTER — Encounter: Payer: Self-pay | Admitting: Orthopedic Surgery

## 2019-02-16 ENCOUNTER — Other Ambulatory Visit: Payer: Self-pay

## 2019-02-16 VITALS — Ht 69.0 in | Wt 150.0 lb

## 2019-02-16 DIAGNOSIS — Z89511 Acquired absence of right leg below knee: Secondary | ICD-10-CM

## 2019-02-16 NOTE — Progress Notes (Signed)
Office Visit Note   Patient: [name]           Date of Birth: 23-Aug-1933           MRN: [id number] Visit Date: 02/16/2019              Requested by: [name], [place],  [place] [address] PCP: [name], MD  Chief Complaint  Patient presents with  . Right Leg - Routine Post Op    01/23/19 revision right BKA       HPI: Patient is an 85 year old gentleman status post revision right transtibial amputation.  Is currently wearing a stump shrinker he is about 3 weeks out.  Assessment & Plan: Visit Diagnoses:  1. S/P BKA (below knee amputation) unilateral, right ([place])     Plan: We will continue with the sutures in place for 1 more week follow-up in 1 week to harvest the sutures.  He will continue with his current stump shrinker change this daily.  Follow-Up Instructions: Return in about 1 week (around 02/23/2019).   Ortho Exam  Patient is alert, oriented, no adenopathy, well-dressed, normal affect, normal respiratory effort. Examination there is minimal swelling there is no cellulitis the wound edges are well approximated no dehiscence no signs of infection.  Imaging: No results found. No images are attached to the encounter.  Labs: Lab Results  Component Value Date   HGBA1C 5.7 (H) 03/26/2018   HGBA1C 5.0 07/12/2017   HGBA1C 5.8 (H) 07/06/2015   ESRSEDRATE 24 (H) 03/24/2018   ESRSEDRATE 15 04/18/2017   ESRSEDRATE 5 09/17/2007   CRP 3.1 (H) 05/11/2018   CRP 1.8 (H) 03/24/2018   CRP <0.8 04/24/2017   LABURIC 6.0 03/09/2016   REPTSTATUS 12/22/2018 FINAL 12/20/2018   GRAMSTAIN  07/15/2017    WBC PRESENT,BOTH PMN AND MONONUCLEAR NO ORGANISMS SEEN Performed at [facility], [place] [address] [place], [place] [address]    CULT >=100,000 COLONIES/mL ESCHERICHIA COLI (A) 12/20/2018   LABORGA ESCHERICHIA COLI (A) 12/20/2018     Lab Results  Component Value Date   ALBUMIN 3.6 12/20/2018   ALBUMIN 3.0 (L) 05/09/2018   ALBUMIN 2.5 (L) 03/31/2018   PREALBUMIN 36 (H) 11/22/2017   PREALBUMIN 28.8 04/24/2017   LABURIC 6.0 03/09/2016    Lab Results  Component Value Date   MG 2.0 05/12/2018   MG 1.9 05/11/2018   MG 1.9 03/31/2018   No results found for: VD25OH  Lab Results  Component Value Date   PREALBUMIN 36 (H) 11/22/2017   PREALBUMIN 28.8 04/24/2017   CBC EXTENDED Latest Ref Rng & Units 01/23/2019 12/20/2018 11/08/2018  WBC 4.0 - 10.5 K/uL 7.3 11.3(H) 9.6  RBC 4.22 - 5.81 MIL/uL 3.80(L) 3.80(L) 3.29(L)  HGB 13.0 - 17.0 g/dL 12.4(L) 12.5(L) 10.9(L)  HCT 39.0 - 52.0 % 41.2 40.0 34.6(L)  PLT 150 - 400 K/uL 135(L) 132(L) 128(L)  NEUTROABS 1.7 - 7.7 K/uL - 9.6(H) 7.8(H)  LYMPHSABS 0.7 - 4.0 K/uL - 0.6(L) 0.7     Body mass index is 22.15 kg/m.  Orders:  No orders of the defined types were placed in this encounter.  No orders of the defined types were placed in this encounter.    Procedures: No procedures performed  Clinical Data: No additional findings.  ROS:  All other systems negative, except as noted in the HPI. Review of Systems  Objective: Vital Signs: Ht 5\' 9"  (1.753 m)   Wt 150 lb (68 kg)   BMI  22.15 kg/m   Specialty Comments:  No specialty comments available.  PMFS History: Patient Active Problem List   Diagnosis Date Noted  . Dehiscence of amputation stump ([place])   . Wound infection 05/09/2018  . AKI (acute kidney injury) ([place])   . S/P BKA (below knee amputation) unilateral, right ([place])   . Below-knee amputation with complication, initial encounter ([place])   . Tachypnea   . Post-operative pain   . Subacute osteomyelitis, right ankle and foot ([place])   . Paralysis of right lower extremity ([place])   . TIA (transient ischemic attack) 03/24/2018  . Encephalopathy 03/24/2018  . Cellulitis 03/15/2018  . Hypernatremia 03/15/2018  . Persistent atrial fibrillation ([place]) 11/10/2017  . Severe muscle deconditioning 11/10/2017  . Hemorrhage 10/22/2017  . CAD S/P  percutaneous coronary angioplasty 10/22/2017  . Acute hypoxemic respiratory failure ([place]) 07/17/2017  . Aspiration syndrome, subsequent encounter 07/11/2017  . Aspiration pneumonia ([place]) 07/01/2017  . Acute encephalopathy 07/01/2017  . Thrombocytopenia ([place]) 07/01/2017  . Chronic diastolic (congestive) heart failure ([place]) 07/01/2017  . Anemia of chronic disease 07/01/2017  . Troponin level elevated 07/01/2017  . Chronic atrial fibrillation 07/01/2017  . Goals of care, counseling/discussion   . Palliative care by specialist   . Acute metabolic encephalopathy 123456  . CKD (chronic kidney disease) stage 3, GFR 30-59 ml/min   . CAP (community acquired pneumonia) 04/17/2017  . Hallux rigidus, right foot 03/10/2016  . Lumbosacral spondylosis without myelopathy 10/29/2015  . Memory difficulty 09/22/2015  . Abnormality of gait 09/22/2015  . Hyperglycemia 07/06/2015  . Chronic pain 07/06/2015  . Chronic insomnia 03/30/2015  . Transient alteration of awareness 03/30/2015  . Abnormal liver function   . Altered mental status 01/31/2015  . Essential hypertension 01/31/2015  . Constipation 01/31/2015  . Hypothyroidism 01/31/2015  . Seizure disorder ([place]) 01/31/2015  . Bladder outlet obstruction 01/31/2015  . GERD (gastroesophageal reflux disease) 01/31/2015  . Chronic back pain 01/31/2015  . Acute kidney injury ([place]) 01/31/2015   Past Medical History:  Diagnosis Date  . Abnormality of gait 09/22/2015  . Arthritis   . Atrial fibrillation ([place])   . CAD (coronary artery disease)    Stent to RCA, Penta stent, 99% reduced to 0% 2002.  . Cancer ([place])    skin CA removed from back  . Chronic insomnia 03/30/2015  . Complication of anesthesia    trouble waking up  . GERD (gastroesophageal reflux disease)   . Hypercholesteremia   . Hypertension   . Hypothyroidism   . Memory difficulty 09/22/2015  . Osteoarthritis   . Pneumonia   . Seizures ([place])   . Sepsis ([place]) 05/2017  . Transient  alteration of awareness 03/30/2015  . Vertigo    hx of    Family History  Problem Relation Age of Onset  . Hypertension Mother   . Cancer Mother   . Kidney failure Father   . Heart disease Father     Past Surgical History:  Procedure Laterality Date  . AMPUTATION Right 03/28/2018   Procedure: AMPUTATION BELOW KNEE;  Surgeon: Newt Minion, MD;  Location: [facility];  Service: Orthopedics;  Laterality: Right;  . APPLICATION OF WOUND VAC Right 01/23/2019   Procedure: Application Of  Prevena Wound Vac;  Surgeon: Newt Minion, MD;  Location: [facility];  Service: Orthopedics;  Laterality: Right;  . BACK SURGERY    . EYE SURGERY     Bilateral Cataract surgery   . HERNIA REPAIR    . I&D EXTREMITY Right  05/10/2015   Procedure: IRRIGATION AND DEBRIDEMENT EXTREMITY;  Surgeon: Leanora Cover, MD;  Location: [facility];  Service: Orthopedics;  Laterality: Right;  . KNEE ARTHROPLASTY     right knee X 2; left knee once  . LAMINECTOMY     X 6  . LEG AMPUTATION BELOW KNEE Right 03/28/2018  . POSTERIOR CERVICAL FUSION/FORAMINOTOMY  01/28/2012   Procedure: POSTERIOR CERVICAL FUSION/FORAMINOTOMY LEVEL 3;  Surgeon: [name], MD;  Location: [facility];  Service: Neurosurgery;  Laterality: Left;  Posterior Cervical Five-Thoracic One Fusion, Arthrodesis with LEFT Cervical Seven-thoracic One Laminectomy, Foraminotomy and Resection of Synovial Cyst  . POSTERIOR CERVICAL FUSION/FORAMINOTOMY N/A 01/29/2013   Procedure: POSTERIOR CERVICAL FUSION/FORAMINOTOMY LEVEL 1 and C2-5 Posteriolateral Arthrodesis;  Surgeon: [name], MD;  Location: [facility];  Service: Neurosurgery;  Laterality: N/A;  C2-C3 Laminectomy C2-C3 posterior cervical arthrodesis  . STUMP REVISION Right 01/23/2019   Procedure: REVISION RIGHT BELOW KNEE AMPUTATION;  Surgeon: Newt Minion, MD;  Location: [facility];  Service: Orthopedics;  Laterality: Right;  . TONSILLECTOMY     Social History   Occupational History  .  Occupation: retired Software engineer  Tobacco Use  . Smoking status: Former Research scientist (life sciences)  . Smokeless tobacco: Never Used  Substance and Sexual Activity  . Alcohol use: Not Currently    Comment: rare  . Drug use: No  . Sexual activity: Not Currently

## 2019-02-16 NOTE — Telephone Encounter (Signed)
Patient is scheduled to come in today and will fix patient's appointment as check out.

## 2019-02-23 ENCOUNTER — Ambulatory Visit (INDEPENDENT_AMBULATORY_CARE_PROVIDER_SITE_OTHER): Payer: Medicare Other | Admitting: Orthopedic Surgery

## 2019-02-23 ENCOUNTER — Encounter: Payer: Self-pay | Admitting: Orthopedic Surgery

## 2019-02-23 ENCOUNTER — Other Ambulatory Visit: Payer: Self-pay

## 2019-02-23 VITALS — Ht 69.0 in | Wt 150.0 lb

## 2019-02-23 DIAGNOSIS — Z89511 Acquired absence of right leg below knee: Secondary | ICD-10-CM

## 2019-02-23 NOTE — Progress Notes (Signed)
Office Visit Note   Patient: [name]           Date of Birth: [birth year]           MRN: [id number] Visit Date: 02/23/2019              Requested by: [name], [place],   [id number] PCP: [name], MD  Chief Complaint  Patient presents with  . Right Leg - Routine Post Op    01/23/2019 Right BKA Revision      HPI: The patient is 4 weeks s/p Right below Knee Amputation. He is doing well. No complaints  Assessment & Plan: Visit Diagnoses: No diagnosis found.  Plan: Will use shrinker. Wash with mild soap daily. Follow up 4 weeks  Follow-Up Instructions: No follow-ups on file.   Ortho Exam  Patient is alert, oriented, no adenopathy, well-dressed, normal affect, normal respiratory effort. Right Knee: Mild amount soft tissue swelling. Incision has healed No cellulitis or erythema or drainage. Sutures were removed  Imaging: No results found. No images are attached to the encounter.  Labs: Lab Results  Component Value Date   HGBA1C 5.7 (H) 03/26/2018   HGBA1C 5.0 07/12/2017   HGBA1C 5.8 (H) 07/06/2015   ESRSEDRATE 24 (H) 03/24/2018   ESRSEDRATE 15 04/18/2017   ESRSEDRATE 5 09/17/2007   CRP 3.1 (H) 05/11/2018   CRP 1.8 (H) 03/24/2018   CRP <0.8 04/24/2017   LABURIC 6.0 03/09/2016   REPTSTATUS 12/22/2018 FINAL 12/20/2018   GRAMSTAIN  07/15/2017    WBC PRESENT,BOTH PMN AND MONONUCLEAR NO ORGANISMS SEEN Performed at [facility], [place] [address] [place], [place] [address]    CULT >=100,000 COLONIES/mL ESCHERICHIA COLI (A) 12/20/2018   LABORGA ESCHERICHIA COLI (A) 12/20/2018     Lab Results  Component Value Date   ALBUMIN 3.6 12/20/2018   ALBUMIN 3.0 (L) 05/09/2018   ALBUMIN 2.5 (L) 03/31/2018   PREALBUMIN 36 (H) 11/22/2017   PREALBUMIN 28.8 04/24/2017   LABURIC 6.0 03/09/2016    Lab Results  Component Value Date   MG 2.0 05/12/2018   MG 1.9 05/11/2018   MG 1.9 03/31/2018   No results found for:  VD25OH  Lab Results  Component Value Date   PREALBUMIN 36 (H) 11/22/2017   PREALBUMIN 28.8 04/24/2017   CBC EXTENDED Latest Ref Rng & Units 01/23/2019 12/20/2018 11/08/2018  WBC 4.0 - 10.5 K/uL 7.3 11.3(H) 9.6  RBC 4.22 - 5.81 MIL/uL 3.80(L) 3.80(L) 3.29(L)  HGB 13.0 - 17.0 g/dL 12.4(L) 12.5(L) 10.9(L)  HCT 39.0 - 52.0 % 41.2 40.0 34.6(L)  PLT 150 - 400 K/uL 135(L) 132(L) 128(L)  NEUTROABS 1.7 - 7.7 K/uL - 9.6(H) 7.8(H)  LYMPHSABS 0.7 - 4.0 K/uL - 0.6(L) 0.7     Body mass index is 22.15 kg/m.  Orders:  No orders of the defined types were placed in this encounter.  No orders of the defined types were placed in this encounter.    Procedures: No procedures performed  Clinical Data: No additional findings.  ROS:  All other systems negative, except as noted in the HPI. Review of Systems  Objective: Vital Signs: Ht 5\' 9"  (1.753 m)   Wt 150 lb (68 kg)   BMI 22.15 kg/m   Specialty Comments:  No specialty comments available.  PMFS History: Patient Active Problem List   Diagnosis Date Noted  . Dehiscence of amputation stump ([place])   . Wound infection 05/09/2018  . AKI (acute kidney injury) ([place])   .  S/P BKA (below knee amputation) unilateral, right ([place])   . Below-knee amputation with complication, initial encounter ([place])   . Tachypnea   . Post-operative pain   . Subacute osteomyelitis, right ankle and foot ([place])   . Paralysis of right lower extremity ([place])   . TIA (transient ischemic attack) 03/24/2018  . Encephalopathy 03/24/2018  . Cellulitis 03/15/2018  . Hypernatremia 03/15/2018  . Persistent atrial fibrillation ([place]) 11/10/2017  . Severe muscle deconditioning 11/10/2017  . Hemorrhage 10/22/2017  . CAD S/P percutaneous coronary angioplasty 10/22/2017  . Acute hypoxemic respiratory failure ([place]) 07/17/2017  . Aspiration syndrome, subsequent encounter 07/11/2017  . Aspiration pneumonia ([place]) 07/01/2017  . Acute encephalopathy 07/01/2017  .  Thrombocytopenia ([place]) 07/01/2017  . Chronic diastolic (congestive) heart failure ([place]) 07/01/2017  . Anemia of chronic disease 07/01/2017  . Troponin level elevated 07/01/2017  . Chronic atrial fibrillation 07/01/2017  . Goals of care, counseling/discussion   . Palliative care by specialist   . Acute metabolic encephalopathy 123456  . CKD (chronic kidney disease) stage 3, GFR 30-59 ml/min   . CAP (community acquired pneumonia) 04/17/2017  . Hallux rigidus, right foot 03/10/2016  . Lumbosacral spondylosis without myelopathy 10/29/2015  . Memory difficulty 09/22/2015  . Abnormality of gait 09/22/2015  . Hyperglycemia 07/06/2015  . Chronic pain 07/06/2015  . Chronic insomnia 03/30/2015  . Transient alteration of awareness 03/30/2015  . Abnormal liver function   . Altered mental status 01/31/2015  . Essential hypertension 01/31/2015  . Constipation 01/31/2015  . Hypothyroidism 01/31/2015  . Seizure disorder ([place]) 01/31/2015  . Bladder outlet obstruction 01/31/2015  . GERD (gastroesophageal reflux disease) 01/31/2015  . Chronic back pain 01/31/2015  . Acute kidney injury ([place]) 01/31/2015   Past Medical History:  Diagnosis Date  . Abnormality of gait 09/22/2015  . Arthritis   . Atrial fibrillation ([place])   . CAD (coronary artery disease)    Stent to RCA, Penta stent, 99% reduced to 0% 2002.  . Cancer ([place])    skin CA removed from back  . Chronic insomnia 03/30/2015  . Complication of anesthesia    trouble waking up  . GERD (gastroesophageal reflux disease)   . Hypercholesteremia   . Hypertension   . Hypothyroidism   . Memory difficulty 09/22/2015  . Osteoarthritis   . Pneumonia   . Seizures ([place])   . Sepsis ([place]) 05/2017  . Transient alteration of awareness 03/30/2015  . Vertigo    hx of    Family History  Problem Relation Age of Onset  . Hypertension Mother   . Cancer Mother   . Kidney failure Father   . Heart disease Father     Past Surgical History:   Procedure Laterality Date  . AMPUTATION Right 03/28/2018   Procedure: AMPUTATION BELOW KNEE;  Surgeon: Newt Minion, MD;  Location: [facility];  Service: Orthopedics;  Laterality: Right;  . APPLICATION OF WOUND VAC Right 01/23/2019   Procedure: Application Of  Prevena Wound Vac;  Surgeon: Newt Minion, MD;  Location: [facility];  Service: Orthopedics;  Laterality: Right;  . BACK SURGERY    . EYE SURGERY     Bilateral Cataract surgery   . HERNIA REPAIR    . I&D EXTREMITY Right 05/10/2015   Procedure: IRRIGATION AND DEBRIDEMENT EXTREMITY;  Surgeon: Leanora Cover, MD;  Location: [facility];  Service: Orthopedics;  Laterality: Right;  . KNEE ARTHROPLASTY     right knee X 2; left knee once  . LAMINECTOMY  X 6  . LEG AMPUTATION BELOW KNEE Right 03/28/2018  . POSTERIOR CERVICAL FUSION/FORAMINOTOMY  01/28/2012   Procedure: POSTERIOR CERVICAL FUSION/FORAMINOTOMY LEVEL 3;  Surgeon: [name], MD;  Location: [facility];  Service: Neurosurgery;  Laterality: Left;  Posterior Cervical Five-Thoracic One Fusion, Arthrodesis with LEFT Cervical Seven-thoracic One Laminectomy, Foraminotomy and Resection of Synovial Cyst  . POSTERIOR CERVICAL FUSION/FORAMINOTOMY N/A 01/29/2013   Procedure: POSTERIOR CERVICAL FUSION/FORAMINOTOMY LEVEL 1 and C2-5 Posteriolateral Arthrodesis;  Surgeon: [name], MD;  Location: [facility];  Service: Neurosurgery;  Laterality: N/A;  C2-C3 Laminectomy C2-C3 posterior cervical arthrodesis  . STUMP REVISION Right 01/23/2019   Procedure: REVISION RIGHT BELOW KNEE AMPUTATION;  Surgeon: Newt Minion, MD;  Location: [facility];  Service: Orthopedics;  Laterality: Right;  . TONSILLECTOMY     Social History   Occupational History  . Occupation: retired Software engineer  Tobacco Use  . Smoking status: Former Research scientist (life sciences)  . Smokeless tobacco: Never Used  Substance and Sexual Activity  . Alcohol use: Not Currently    Comment: rare  . Drug use: No  . Sexual activity:  Not Currently

## 2019-02-25 ENCOUNTER — Ambulatory Visit: Payer: Medicare Other | Admitting: Family

## 2019-02-27 ENCOUNTER — Ambulatory Visit: Payer: Medicare Other | Admitting: Physician Assistant

## 2019-03-12 ENCOUNTER — Other Ambulatory Visit: Payer: Self-pay

## 2019-03-12 ENCOUNTER — Ambulatory Visit (INDEPENDENT_AMBULATORY_CARE_PROVIDER_SITE_OTHER): Payer: Medicare Other | Admitting: Orthopedic Surgery

## 2019-03-12 ENCOUNTER — Encounter: Payer: Self-pay | Admitting: Orthopedic Surgery

## 2019-03-12 VITALS — Ht 69.0 in | Wt 150.0 lb

## 2019-03-12 DIAGNOSIS — Z89511 Acquired absence of right leg below knee: Secondary | ICD-10-CM

## 2019-03-23 ENCOUNTER — Encounter: Payer: Self-pay | Admitting: Orthopedic Surgery

## 2019-03-23 ENCOUNTER — Ambulatory Visit: Payer: Medicare Other | Admitting: Orthopedic Surgery

## 2019-03-23 NOTE — Progress Notes (Signed)
Office Visit Note   Patient: [name]           Date of Birth: 1933/11/20           MRN: [id number] Visit Date: 03/12/2019              Requested by: [name], [place],   [id number] PCP: [name], MD  Chief Complaint  Patient presents with  . Right Leg - Routine Post Op    01/23/2019 Right BKA Revision      HPI: Patient is a 86 year old gentleman who presents 6 weeks status post revision right transtibial amputation.  Patient is currently wearing a stump shrinker.  Assessment & Plan: Visit Diagnoses:  1. S/P BKA (below knee amputation) unilateral, right ([facility])     Plan: Patient's leg is healing quite well recommended decreasing the shrinker from a double extra-large to an extra-large to help decrease the swelling.  Follow-Up Instructions: Return in about 4 weeks (around 04/09/2019).   Ortho Exam  Patient is alert, oriented, no adenopathy, well-dressed, normal affect, normal respiratory effort. Examinations patient's leg is healed quite nicely patient does have venous stasis swelling in the left leg but no ulcers the right transtibial amputation is healed well there are no ulcers there is swelling there is no cellulitis there is no drainage  Imaging: No results found. No images are attached to the encounter.  Labs: Lab Results  Component Value Date   HGBA1C 5.7 (H) 03/26/2018   HGBA1C 5.0 07/12/2017   HGBA1C 5.8 (H) 07/06/2015   ESRSEDRATE 24 (H) 03/24/2018   ESRSEDRATE 15 04/18/2017   ESRSEDRATE 5 09/17/2007   CRP 3.1 (H) 05/11/2018   CRP 1.8 (H) 03/24/2018   CRP <0.8 04/24/2017   LABURIC 6.0 03/09/2016   REPTSTATUS 12/22/2018 FINAL 12/20/2018   GRAMSTAIN  07/15/2017    WBC PRESENT,BOTH PMN AND MONONUCLEAR NO ORGANISMS SEEN Performed at [facility], [place] [address] [place], [place] [address]    CULT >=100,000 COLONIES/mL ESCHERICHIA COLI (A) 12/20/2018   LABORGA ESCHERICHIA COLI (A) 12/20/2018     Lab  Results  Component Value Date   ALBUMIN 3.6 12/20/2018   ALBUMIN 3.0 (L) 05/09/2018   ALBUMIN 2.5 (L) 03/31/2018   PREALBUMIN 36 (H) 11/22/2017   PREALBUMIN 28.8 04/24/2017   LABURIC 6.0 03/09/2016    Lab Results  Component Value Date   MG 2.0 05/12/2018   MG 1.9 05/11/2018   MG 1.9 03/31/2018   No results found for: VD25OH  Lab Results  Component Value Date   PREALBUMIN 36 (H) 11/22/2017   PREALBUMIN 28.8 04/24/2017   CBC EXTENDED Latest Ref Rng & Units 01/23/2019 12/20/2018 11/08/2018  WBC 4.0 - 10.5 K/uL 7.3 11.3(H) 9.6  RBC 4.22 - 5.81 MIL/uL 3.80(L) 3.80(L) 3.29(L)  HGB 13.0 - 17.0 g/dL 12.4(L) 12.5(L) 10.9(L)  HCT 39.0 - 52.0 % 41.2 40.0 34.6(L)  PLT 150 - 400 K/uL 135(L) 132(L) 128(L)  NEUTROABS 1.7 - 7.7 K/uL - 9.6(H) 7.8(H)  LYMPHSABS 0.7 - 4.0 K/uL - 0.6(L) 0.7     Body mass index is 22.15 kg/m.  Orders:  No orders of the defined types were placed in this encounter.  No orders of the defined types were placed in this encounter.    Procedures: No procedures performed  Clinical Data: No additional findings.  ROS:  All other systems negative, except as noted in the HPI. Review of Systems  Objective: Vital Signs: Ht 5\' 9"  (1.753 m)  Wt 150 lb (68 kg)   BMI 22.15 kg/m   Specialty Comments:  No specialty comments available.  PMFS History: Patient Active Problem List   Diagnosis Date Noted  . Dehiscence of amputation stump ([place])   . Wound infection 05/09/2018  . AKI (acute kidney injury) ([place])   . S/P BKA (below knee amputation) unilateral, right ([place])   . Below-knee amputation with complication, initial encounter ([place])   . Tachypnea   . Post-operative pain   . Subacute osteomyelitis, right ankle and foot ([place])   . Paralysis of right lower extremity ([place])   . TIA (transient ischemic attack) 03/24/2018  . Encephalopathy 03/24/2018  . Cellulitis 03/15/2018  . Hypernatremia 03/15/2018  . Persistent atrial fibrillation ([place]) 11/10/2017  .  Severe muscle deconditioning 11/10/2017  . Hemorrhage 10/22/2017  . CAD S/P percutaneous coronary angioplasty 10/22/2017  . Acute hypoxemic respiratory failure ([place]) 07/17/2017  . Aspiration syndrome, subsequent encounter 07/11/2017  . Aspiration pneumonia ([place]) 07/01/2017  . Acute encephalopathy 07/01/2017  . Thrombocytopenia ([place]) 07/01/2017  . Chronic diastolic (congestive) heart failure ([place]) 07/01/2017  . Anemia of chronic disease 07/01/2017  . Troponin level elevated 07/01/2017  . Chronic atrial fibrillation 07/01/2017  . Goals of care, counseling/discussion   . Palliative care by specialist   . Acute metabolic encephalopathy 123456  . CKD (chronic kidney disease) stage 3, GFR 30-59 ml/min   . CAP (community acquired pneumonia) 04/17/2017  . Hallux rigidus, right foot 03/10/2016  . Lumbosacral spondylosis without myelopathy 10/29/2015  . Memory difficulty 09/22/2015  . Abnormality of gait 09/22/2015  . Hyperglycemia 07/06/2015  . Chronic pain 07/06/2015  . Chronic insomnia 03/30/2015  . Transient alteration of awareness 03/30/2015  . Abnormal liver function   . Altered mental status 01/31/2015  . Essential hypertension 01/31/2015  . Constipation 01/31/2015  . Hypothyroidism 01/31/2015  . Seizure disorder ([place]) 01/31/2015  . Bladder outlet obstruction 01/31/2015  . GERD (gastroesophageal reflux disease) 01/31/2015  . Chronic back pain 01/31/2015  . Acute kidney injury ([place]) 01/31/2015   Past Medical History:  Diagnosis Date  . Abnormality of gait 09/22/2015  . Arthritis   . Atrial fibrillation ([place])   . CAD (coronary artery disease)    Stent to RCA, Penta stent, 99% reduced to 0% 2002.  . Cancer ([place])    skin CA removed from back  . Chronic insomnia 03/30/2015  . Complication of anesthesia    trouble waking up  . GERD (gastroesophageal reflux disease)   . Hypercholesteremia   . Hypertension   . Hypothyroidism   . Memory difficulty 09/22/2015  .  Osteoarthritis   . Pneumonia   . Seizures ([place])   . Sepsis ([place]) 05/2017  . Transient alteration of awareness 03/30/2015  . Vertigo    hx of    Family History  Problem Relation Age of Onset  . Hypertension Mother   . Cancer Mother   . Kidney failure Father   . Heart disease Father     Past Surgical History:  Procedure Laterality Date  . AMPUTATION Right 03/28/2018   Procedure: AMPUTATION BELOW KNEE;  Surgeon: Newt Minion, MD;  Location: [facility];  Service: Orthopedics;  Laterality: Right;  . APPLICATION OF WOUND VAC Right 01/23/2019   Procedure: Application Of  Prevena Wound Vac;  Surgeon: Newt Minion, MD;  Location: [facility];  Service: Orthopedics;  Laterality: Right;  . BACK SURGERY    . EYE SURGERY     Bilateral Cataract surgery   . HERNIA  REPAIR    . I & D EXTREMITY Right 05/10/2015   Procedure: IRRIGATION AND DEBRIDEMENT EXTREMITY;  Surgeon: Leanora Cover, MD;  Location: [facility];  Service: Orthopedics;  Laterality: Right;  . KNEE ARTHROPLASTY     right knee X 2; left knee once  . LAMINECTOMY     X 6  . LEG AMPUTATION BELOW KNEE Right 03/28/2018  . POSTERIOR CERVICAL FUSION/FORAMINOTOMY  01/28/2012   Procedure: POSTERIOR CERVICAL FUSION/FORAMINOTOMY LEVEL 3;  Surgeon: [name], MD;  Location: [facility];  Service: Neurosurgery;  Laterality: Left;  Posterior Cervical Five-Thoracic One Fusion, Arthrodesis with LEFT Cervical Seven-thoracic One Laminectomy, Foraminotomy and Resection of Synovial Cyst  . POSTERIOR CERVICAL FUSION/FORAMINOTOMY N/A 01/29/2013   Procedure: POSTERIOR CERVICAL FUSION/FORAMINOTOMY LEVEL 1 and C2-5 Posteriolateral Arthrodesis;  Surgeon: [name], MD;  Location: [facility];  Service: Neurosurgery;  Laterality: N/A;  C2-C3 Laminectomy C2-C3 posterior cervical arthrodesis  . STUMP REVISION Right 01/23/2019   Procedure: REVISION RIGHT BELOW KNEE AMPUTATION;  Surgeon: Newt Minion, MD;  Location: [facility];  Service:  Orthopedics;  Laterality: Right;  . TONSILLECTOMY     Social History   Occupational History  . Occupation: retired Software engineer  Tobacco Use  . Smoking status: Former Research scientist (life sciences)  . Smokeless tobacco: Never Used  Substance and Sexual Activity  . Alcohol use: Not Currently    Comment: rare  . Drug use: No  . Sexual activity: Not Currently

## 2019-04-09 ENCOUNTER — Encounter: Payer: Self-pay | Admitting: Orthopedic Surgery

## 2019-04-09 ENCOUNTER — Ambulatory Visit (INDEPENDENT_AMBULATORY_CARE_PROVIDER_SITE_OTHER): Payer: Medicare Other | Admitting: Orthopedic Surgery

## 2019-04-09 ENCOUNTER — Other Ambulatory Visit: Payer: Self-pay

## 2019-04-09 VITALS — Ht 69.0 in | Wt 150.0 lb

## 2019-04-09 DIAGNOSIS — M86271 Subacute osteomyelitis, right ankle and foot: Secondary | ICD-10-CM

## 2019-04-09 NOTE — Progress Notes (Signed)
Office Visit Note   Patient: [name]           Date of Birth: Mar 05, 1934           MRN: [id number] Visit Date: 04/09/2019              Requested by: [name], [place],  [place] [address] PCP: [name], MD  Chief Complaint  Patient presents with  . Right Leg - Routine Post Op    01/23/2019 Revision RBKA      HPI: This is a pleasant gentleman who is now 10 weeks status post revision right below-knee amputation he has gone down in 1 shrinker size doing well   Assessment & Plan: Visit Diagnoses: No diagnosis found.  Plan: He will follow up in 1 month.  In the meantime of also given him a prescription to begin working with [employer] on a prosthetic  Follow-Up Instructions: No follow-ups on file.   Ortho Exam  Patient is alert, oriented, no adenopathy, well-dressed, normal affect, normal respiratory effort. Focused examination of the right amputation stump swelling has decreased with wrinkling of the skin the wound is completely healed there are no areas of abrasions or breakdown  Imaging: No results found. No images are attached to the encounter.  Labs: Lab Results  Component Value Date   HGBA1C 5.7 (H) 03/26/2018   HGBA1C 5.0 07/12/2017   HGBA1C 5.8 (H) 07/06/2015   ESRSEDRATE 24 (H) 03/24/2018   ESRSEDRATE 15 04/18/2017   ESRSEDRATE 5 09/17/2007   CRP 3.1 (H) 05/11/2018   CRP 1.8 (H) 03/24/2018   CRP <0.8 04/24/2017   LABURIC 6.0 03/09/2016   REPTSTATUS 12/22/2018 FINAL 12/20/2018   GRAMSTAIN  07/15/2017    WBC PRESENT,BOTH PMN AND MONONUCLEAR NO ORGANISMS SEEN Performed at [facility], [place] [address] [place], [place] [address]    CULT >=100,000 COLONIES/mL ESCHERICHIA COLI (A) 12/20/2018   LABORGA ESCHERICHIA COLI (A) 12/20/2018     Lab Results  Component Value Date   ALBUMIN 3.6 12/20/2018   ALBUMIN 3.0 (L) 05/09/2018   ALBUMIN 2.5 (L) 03/31/2018   PREALBUMIN 36 (H) 11/22/2017   PREALBUMIN 28.8  04/24/2017   LABURIC 6.0 03/09/2016    Lab Results  Component Value Date   MG 2.0 05/12/2018   MG 1.9 05/11/2018   MG 1.9 03/31/2018   No results found for: VD25OH  Lab Results  Component Value Date   PREALBUMIN 36 (H) 11/22/2017   PREALBUMIN 28.8 04/24/2017   CBC EXTENDED Latest Ref Rng & Units 01/23/2019 12/20/2018 11/08/2018  WBC 4.0 - 10.5 K/uL 7.3 11.3(H) 9.6  RBC 4.22 - 5.81 MIL/uL 3.80(L) 3.80(L) 3.29(L)  HGB 13.0 - 17.0 g/dL 12.4(L) 12.5(L) 10.9(L)  HCT 39.0 - 52.0 % 41.2 40.0 34.6(L)  PLT 150 - 400 K/uL 135(L) 132(L) 128(L)  NEUTROABS 1.7 - 7.7 K/uL - 9.6(H) 7.8(H)  LYMPHSABS 0.7 - 4.0 K/uL - 0.6(L) 0.7     Body mass index is 22.15 kg/m.  Orders:  No orders of the defined types were placed in this encounter.  No orders of the defined types were placed in this encounter.    Procedures: No procedures performed  Clinical Data: No additional findings.  ROS:  All other systems negative, except as noted in the HPI. Review of Systems  Objective: Vital Signs: Ht 5\' 9"  (1.753 m)   Wt 150 lb (68 kg)   BMI 22.15 kg/m   Specialty Comments:  No specialty comments available.  PMFS History: Patient Active Problem List   Diagnosis Date Noted  . Dehiscence of amputation stump ([place])   . Wound infection 05/09/2018  . AKI (acute kidney injury) ([place])   . S/P BKA (below knee amputation) unilateral, right ([place])   . Below-knee amputation with complication, initial encounter ([place])   . Tachypnea   . Post-operative pain   . Subacute osteomyelitis, right ankle and foot ([place])   . Paralysis of right lower extremity ([place])   . TIA (transient ischemic attack) 03/24/2018  . Encephalopathy 03/24/2018  . Cellulitis 03/15/2018  . Hypernatremia 03/15/2018  . Persistent atrial fibrillation ([place]) 11/10/2017  . Severe muscle deconditioning 11/10/2017  . Hemorrhage 10/22/2017  . CAD S/P percutaneous coronary angioplasty 10/22/2017  . Acute hypoxemic respiratory failure  ([place]) 07/17/2017  . Aspiration syndrome, subsequent encounter 07/11/2017  . Aspiration pneumonia ([place]) 07/01/2017  . Acute encephalopathy 07/01/2017  . Thrombocytopenia ([place]) 07/01/2017  . Chronic diastolic (congestive) heart failure ([place]) 07/01/2017  . Anemia of chronic disease 07/01/2017  . Troponin level elevated 07/01/2017  . Chronic atrial fibrillation 07/01/2017  . Goals of care, counseling/discussion   . Palliative care by specialist   . Acute metabolic encephalopathy 123456  . CKD (chronic kidney disease) stage 3, GFR 30-59 ml/min   . CAP (community acquired pneumonia) 04/17/2017  . Hallux rigidus, right foot 03/10/2016  . Lumbosacral spondylosis without myelopathy 10/29/2015  . Memory difficulty 09/22/2015  . Abnormality of gait 09/22/2015  . Hyperglycemia 07/06/2015  . Chronic pain 07/06/2015  . Chronic insomnia 03/30/2015  . Transient alteration of awareness 03/30/2015  . Abnormal liver function   . Altered mental status 01/31/2015  . Essential hypertension 01/31/2015  . Constipation 01/31/2015  . Hypothyroidism 01/31/2015  . Seizure disorder ([place]) 01/31/2015  . Bladder outlet obstruction 01/31/2015  . GERD (gastroesophageal reflux disease) 01/31/2015  . Chronic back pain 01/31/2015  . Acute kidney injury ([place]) 01/31/2015   Past Medical History:  Diagnosis Date  . Abnormality of gait 09/22/2015  . Arthritis   . Atrial fibrillation ([place])   . CAD (coronary artery disease)    Stent to RCA, Penta stent, 99% reduced to 0% 2002.  . Cancer ([place])    skin CA removed from back  . Chronic insomnia 03/30/2015  . Complication of anesthesia    trouble waking up  . GERD (gastroesophageal reflux disease)   . Hypercholesteremia   . Hypertension   . Hypothyroidism   . Memory difficulty 09/22/2015  . Osteoarthritis   . Pneumonia   . Seizures ([place])   . Sepsis ([place]) 05/2017  . Transient alteration of awareness 03/30/2015  . Vertigo    hx of    Family History  Problem  Relation Age of Onset  . Hypertension Mother   . Cancer Mother   . Kidney failure Father   . Heart disease Father     Past Surgical History:  Procedure Laterality Date  . AMPUTATION Right 03/28/2018   Procedure: AMPUTATION BELOW KNEE;  Surgeon: Newt Minion, MD;  Location: [facility];  Service: Orthopedics;  Laterality: Right;  . APPLICATION OF WOUND VAC Right 01/23/2019   Procedure: Application Of  Prevena Wound Vac;  Surgeon: Newt Minion, MD;  Location: [facility];  Service: Orthopedics;  Laterality: Right;  . BACK SURGERY    . EYE SURGERY     Bilateral Cataract surgery   . HERNIA REPAIR    . I & D EXTREMITY Right 05/10/2015   Procedure: IRRIGATION AND DEBRIDEMENT EXTREMITY;  Surgeon:  Leanora Cover, MD;  Location: [facility];  Service: Orthopedics;  Laterality: Right;  . KNEE ARTHROPLASTY     right knee X 2; left knee once  . LAMINECTOMY     X 6  . LEG AMPUTATION BELOW KNEE Right 03/28/2018  . POSTERIOR CERVICAL FUSION/FORAMINOTOMY  01/28/2012   Procedure: POSTERIOR CERVICAL FUSION/FORAMINOTOMY LEVEL 3;  Surgeon: [name], MD;  Location: [facility];  Service: Neurosurgery;  Laterality: Left;  Posterior Cervical Five-Thoracic One Fusion, Arthrodesis with LEFT Cervical Seven-thoracic One Laminectomy, Foraminotomy and Resection of Synovial Cyst  . POSTERIOR CERVICAL FUSION/FORAMINOTOMY N/A 01/29/2013   Procedure: POSTERIOR CERVICAL FUSION/FORAMINOTOMY LEVEL 1 and C2-5 Posteriolateral Arthrodesis;  Surgeon: [name], MD;  Location: [facility];  Service: Neurosurgery;  Laterality: N/A;  C2-C3 Laminectomy C2-C3 posterior cervical arthrodesis  . STUMP REVISION Right 01/23/2019   Procedure: REVISION RIGHT BELOW KNEE AMPUTATION;  Surgeon: Newt Minion, MD;  Location: [facility];  Service: Orthopedics;  Laterality: Right;  . TONSILLECTOMY     Social History   Occupational History  . Occupation: retired Software engineer  Tobacco Use  . Smoking status: Former Research scientist (life sciences)  .  Smokeless tobacco: Never Used  Substance and Sexual Activity  . Alcohol use: Not Currently    Comment: rare  . Drug use: No  . Sexual activity: Not Currently

## 2019-05-07 ENCOUNTER — Other Ambulatory Visit: Payer: Self-pay

## 2019-05-07 ENCOUNTER — Ambulatory Visit (INDEPENDENT_AMBULATORY_CARE_PROVIDER_SITE_OTHER): Payer: Medicare Other | Admitting: Physician Assistant

## 2019-05-07 ENCOUNTER — Encounter: Payer: Self-pay | Admitting: Orthopedic Surgery

## 2019-05-07 VITALS — Ht 69.0 in | Wt 150.0 lb

## 2019-05-07 DIAGNOSIS — Z89511 Acquired absence of right leg below knee: Secondary | ICD-10-CM

## 2019-05-07 NOTE — Progress Notes (Signed)
Office Visit Note   Patient: [name]           Date of Birth: 22-Nov-1933           MRN: [id number] Visit Date: 05/07/2019              Requested by: [name], [place],  [place] [address] PCP: [name], MD  Chief Complaint  Patient presents with  . Right Leg - Follow-up      HPI: This is a pleasant gentleman who is here for follow-up on his right below-knee amputation he is doing well and is to be fit for his prosthetic today.  His wife is inquiring if he can start doing some physical therapy as he has a very weak upper body and has some significant issues with his left shoulder  Assessment & Plan: Visit Diagnoses:  1. S/P BKA (below knee amputation) unilateral, right ([place])     Plan: He will obtain his prosthetic.  I have forwarded a physical therapy prescription he will follow-up with [place] in 2 months  Follow-Up Instructions: No follow-ups on file.   Ortho Exam  Patient is alert, oriented, no adenopathy, well-dressed, normal affect, normal respiratory effort. Focused examination demonstrates healed surgical incision.  Swelling is well controlled he has no drainage and very healthy wound edges knee range of motion is excellent no cellulitis  Imaging: No results found. No images are attached to the encounter.  Labs: Lab Results  Component Value Date   HGBA1C 5.7 (H) 03/26/2018   HGBA1C 5.0 07/12/2017   HGBA1C 5.8 (H) 07/06/2015   ESRSEDRATE 24 (H) 03/24/2018   ESRSEDRATE 15 04/18/2017   ESRSEDRATE 5 09/17/2007   CRP 3.1 (H) 05/11/2018   CRP 1.8 (H) 03/24/2018   CRP <0.8 04/24/2017   LABURIC 6.0 03/09/2016   REPTSTATUS 12/22/2018 FINAL 12/20/2018   GRAMSTAIN  07/15/2017    WBC PRESENT,BOTH PMN AND MONONUCLEAR NO ORGANISMS SEEN Performed at [facility], [place] [address], [place], [place] [address]    CULT >=100,000 COLONIES/mL ESCHERICHIA COLI (A) 12/20/2018   LABORGA ESCHERICHIA COLI (A) 12/20/2018     Lab Results   Component Value Date   ALBUMIN 3.6 12/20/2018   ALBUMIN 3.0 (L) 05/09/2018   ALBUMIN 2.5 (L) 03/31/2018   PREALBUMIN 36 (H) 11/22/2017   PREALBUMIN 28.8 04/24/2017   LABURIC 6.0 03/09/2016    Lab Results  Component Value Date   MG 2.0 05/12/2018   MG 1.9 05/11/2018   MG 1.9 03/31/2018   No results found for: VD25OH  Lab Results  Component Value Date   PREALBUMIN 36 (H) 11/22/2017   PREALBUMIN 28.8 04/24/2017   CBC EXTENDED Latest Ref Rng & Units 01/23/2019 12/20/2018 11/08/2018  WBC 4.0 - 10.5 K/uL 7.3 11.3(H) 9.6  RBC 4.22 - 5.81 MIL/uL 3.80(L) 3.80(L) 3.29(L)  HGB 13.0 - 17.0 g/dL 12.4(L) 12.5(L) 10.9(L)  HCT 39.0 - 52.0 % 41.2 40.0 34.6(L)  PLT 150 - 400 K/uL 135(L) 132(L) 128(L)  NEUTROABS 1.7 - 7.7 K/uL - 9.6(H) 7.8(H)  LYMPHSABS 0.7 - 4.0 K/uL - 0.6(L) 0.7     Body mass index is 22.15 kg/m.  Orders:  Orders Placed This Encounter  Procedures  . Ambulatory referral to Physical Therapy   No orders of the defined types were placed in this encounter.    Procedures: No procedures performed  Clinical Data: No additional findings.  ROS:  All other systems negative, except as noted in the HPI. Review  of Systems  Objective: Vital Signs: Ht 5\' 9"  (1.753 m)   Wt 150 lb (68 kg)   BMI 22.15 kg/m   Specialty Comments:  No specialty comments available.  PMFS History: Patient Active Problem List   Diagnosis Date Noted  . Dehiscence of amputation stump ([place])   . Wound infection 05/09/2018  . AKI (acute kidney injury) ([place])   . S/P BKA (below knee amputation) unilateral, right ([place])   . Below-knee amputation with complication, initial encounter ([place])   . Tachypnea   . Post-operative pain   . Subacute osteomyelitis, right ankle and foot ([place])   . Paralysis of right lower extremity ([place])   . TIA (transient ischemic attack) 03/24/2018  . Encephalopathy 03/24/2018  . Cellulitis 03/15/2018  . Hypernatremia 03/15/2018  . Persistent atrial fibrillation  ([place]) 11/10/2017  . Severe muscle deconditioning 11/10/2017  . Hemorrhage 10/22/2017  . CAD S/P percutaneous coronary angioplasty 10/22/2017  . Acute hypoxemic respiratory failure ([place]) 07/17/2017  . Aspiration syndrome, subsequent encounter 07/11/2017  . Aspiration pneumonia ([place]) 07/01/2017  . Acute encephalopathy 07/01/2017  . Thrombocytopenia ([place]) 07/01/2017  . Chronic diastolic (congestive) heart failure ([place]) 07/01/2017  . Anemia of chronic disease 07/01/2017  . Troponin level elevated 07/01/2017  . Chronic atrial fibrillation 07/01/2017  . Goals of care, counseling/discussion   . Palliative care by specialist   . Acute metabolic encephalopathy 123456  . CKD (chronic kidney disease) stage 3, GFR 30-59 ml/min   . CAP (community acquired pneumonia) 04/17/2017  . Hallux rigidus, right foot 03/10/2016  . Lumbosacral spondylosis without myelopathy 10/29/2015  . Memory difficulty 09/22/2015  . Abnormality of gait 09/22/2015  . Hyperglycemia 07/06/2015  . Chronic pain 07/06/2015  . Chronic insomnia 03/30/2015  . Transient alteration of awareness 03/30/2015  . Abnormal liver function   . Altered mental status 01/31/2015  . Essential hypertension 01/31/2015  . Constipation 01/31/2015  . Hypothyroidism 01/31/2015  . Seizure disorder ([place]) 01/31/2015  . Bladder outlet obstruction 01/31/2015  . GERD (gastroesophageal reflux disease) 01/31/2015  . Chronic back pain 01/31/2015  . Acute kidney injury ([place]) 01/31/2015   Past Medical History:  Diagnosis Date  . Abnormality of gait 09/22/2015  . Arthritis   . Atrial fibrillation ([place])   . CAD (coronary artery disease)    Stent to RCA, Penta stent, 99% reduced to 0% 2002.  . Cancer ([place])    skin CA removed from back  . Chronic insomnia 03/30/2015  . Complication of anesthesia    trouble waking up  . GERD (gastroesophageal reflux disease)   . Hypercholesteremia   . Hypertension   . Hypothyroidism   . Memory difficulty  09/22/2015  . Osteoarthritis   . Pneumonia   . Seizures ([place])   . Sepsis ([place]) 05/2017  . Transient alteration of awareness 03/30/2015  . Vertigo    hx of    Family History  Problem Relation Age of Onset  . Hypertension Mother   . Cancer Mother   . Kidney failure Father   . Heart disease Father     Past Surgical History:  Procedure Laterality Date  . AMPUTATION Right 03/28/2018   Procedure: AMPUTATION BELOW KNEE;  Surgeon: Newt Minion, MD;  Location: [facility];  Service: Orthopedics;  Laterality: Right;  . APPLICATION OF WOUND VAC Right 01/23/2019   Procedure: Application Of  Prevena Wound Vac;  Surgeon: Newt Minion, MD;  Location: [facility];  Service: Orthopedics;  Laterality: Right;  . BACK SURGERY    .  EYE SURGERY     Bilateral Cataract surgery   . HERNIA REPAIR    . I & D EXTREMITY Right 05/10/2015   Procedure: IRRIGATION AND DEBRIDEMENT EXTREMITY;  Surgeon: Leanora Cover, MD;  Location: [facility];  Service: Orthopedics;  Laterality: Right;  . KNEE ARTHROPLASTY     right knee X 2; left knee once  . LAMINECTOMY     X 6  . LEG AMPUTATION BELOW KNEE Right 03/28/2018  . POSTERIOR CERVICAL FUSION/FORAMINOTOMY  01/28/2012   Procedure: POSTERIOR CERVICAL FUSION/FORAMINOTOMY LEVEL 3;  Surgeon: [name], MD;  Location: [facility];  Service: Neurosurgery;  Laterality: Left;  Posterior Cervical Five-Thoracic One Fusion, Arthrodesis with LEFT Cervical Seven-thoracic One Laminectomy, Foraminotomy and Resection of Synovial Cyst  . POSTERIOR CERVICAL FUSION/FORAMINOTOMY N/A 01/29/2013   Procedure: POSTERIOR CERVICAL FUSION/FORAMINOTOMY LEVEL 1 and C2-5 Posteriolateral Arthrodesis;  Surgeon: [name], MD;  Location: [facility];  Service: Neurosurgery;  Laterality: N/A;  C2-C3 Laminectomy C2-C3 posterior cervical arthrodesis  . STUMP REVISION Right 01/23/2019   Procedure: REVISION RIGHT BELOW KNEE AMPUTATION;  Surgeon: Newt Minion, MD;  Location: [facility];   Service: Orthopedics;  Laterality: Right;  . TONSILLECTOMY     Social History   Occupational History  . Occupation: retired Software engineer  Tobacco Use  . Smoking status: Former Research scientist (life sciences)  . Smokeless tobacco: Never Used  Substance and Sexual Activity  . Alcohol use: Not Currently    Comment: rare  . Drug use: No  . Sexual activity: Not Currently

## 2019-05-11 ENCOUNTER — Encounter: Payer: Self-pay | Admitting: Physical Therapy

## 2019-05-11 ENCOUNTER — Ambulatory Visit (INDEPENDENT_AMBULATORY_CARE_PROVIDER_SITE_OTHER): Payer: Medicare Other | Admitting: Physical Therapy

## 2019-05-11 ENCOUNTER — Ambulatory Visit (INDEPENDENT_AMBULATORY_CARE_PROVIDER_SITE_OTHER): Payer: Medicare Other | Admitting: Physician Assistant

## 2019-05-11 ENCOUNTER — Encounter: Payer: Self-pay | Admitting: Physician Assistant

## 2019-05-11 ENCOUNTER — Other Ambulatory Visit: Payer: Self-pay

## 2019-05-11 DIAGNOSIS — M6281 Muscle weakness (generalized): Secondary | ICD-10-CM | POA: Diagnosis not present

## 2019-05-11 DIAGNOSIS — M86271 Subacute osteomyelitis, right ankle and foot: Secondary | ICD-10-CM

## 2019-05-11 DIAGNOSIS — M545 Low back pain, unspecified: Secondary | ICD-10-CM

## 2019-05-11 DIAGNOSIS — M25662 Stiffness of left knee, not elsewhere classified: Secondary | ICD-10-CM

## 2019-05-11 DIAGNOSIS — M6249 Contracture of muscle, multiple sites: Secondary | ICD-10-CM

## 2019-05-11 DIAGNOSIS — R2689 Other abnormalities of gait and mobility: Secondary | ICD-10-CM | POA: Diagnosis not present

## 2019-05-11 DIAGNOSIS — R293 Abnormal posture: Secondary | ICD-10-CM

## 2019-05-11 DIAGNOSIS — R2681 Unsteadiness on feet: Secondary | ICD-10-CM | POA: Diagnosis not present

## 2019-05-11 DIAGNOSIS — Z9181 History of falling: Secondary | ICD-10-CM

## 2019-05-11 DIAGNOSIS — G8929 Other chronic pain: Secondary | ICD-10-CM

## 2019-05-11 DIAGNOSIS — M542 Cervicalgia: Secondary | ICD-10-CM

## 2019-05-11 DIAGNOSIS — M25661 Stiffness of right knee, not elsewhere classified: Secondary | ICD-10-CM

## 2019-05-11 NOTE — Therapy (Signed)
[facility] [place], [place], [address] Phone: [phone]   Fax:  [phone]  Physical Therapy Evaluation  Patient Details  Name: [name] MRN: [id number] Date of Birth: 28-Aug-1933 Referring Provider (PT): Bevely Palmer Persons, [place]   Encounter Date: 05/11/2019  PT End of Session - 05/11/19 2126    Visit Number  1    Number of Visits  50    Date for PT Re-Evaluation  08/06/19    Authorization Type  BCBS & Medicare    PT Start Time  1322    PT Stop Time  1400    PT Time Calculation (min)  38 min    Equipment Utilized During Treatment  Gait belt    Activity Tolerance  Patient tolerated treatment well    Behavior During Therapy  [facility] for tasks assessed/performed       Past Medical History:  Diagnosis Date  . Abnormality of gait 09/22/2015  . Arthritis   . Atrial fibrillation ([place])   . CAD (coronary artery disease)    Stent to RCA, Penta stent, 99% reduced to 0% 2002.  . Cancer ([place])    skin CA removed from back  . Chronic insomnia 03/30/2015  . Complication of anesthesia    trouble waking up  . GERD (gastroesophageal reflux disease)   . Hypercholesteremia   . Hypertension   . Hypothyroidism   . Memory difficulty 09/22/2015  . Osteoarthritis   . Pneumonia   . Seizures ([place])   . Sepsis ([place]) 05/2017  . Transient alteration of awareness 03/30/2015  . Vertigo    hx of    Past Surgical History:  Procedure Laterality Date  . AMPUTATION Right 03/28/2018   Procedure: AMPUTATION BELOW KNEE;  Surgeon: Newt Minion, MD;  Location: [place];  Service: Orthopedics;  Laterality: Right;  . APPLICATION OF WOUND VAC Right 01/23/2019   Procedure: Application Of  Prevena Wound Vac;  Surgeon: Newt Minion, MD;  Location: [place];  Service: Orthopedics;  Laterality: Right;  . BACK SURGERY    . EYE SURGERY     Bilateral Cataract surgery   . HERNIA REPAIR    . I & D EXTREMITY Right 05/10/2015   Procedure: IRRIGATION AND DEBRIDEMENT  EXTREMITY;  Surgeon: Leanora Cover, MD;  Location: [place];  Service: Orthopedics;  Laterality: Right;  . KNEE ARTHROPLASTY     right knee X 2; left knee once  . LAMINECTOMY     X 6  . LEG AMPUTATION BELOW KNEE Right 03/28/2018  . POSTERIOR CERVICAL FUSION/FORAMINOTOMY  01/28/2012   Procedure: POSTERIOR CERVICAL FUSION/FORAMINOTOMY LEVEL 3;  Surgeon: [name], MD;  Location: [place] [facility];  Service: Neurosurgery;  Laterality: Left;  Posterior Cervical Five-Thoracic One Fusion, Arthrodesis with LEFT Cervical Seven-thoracic One Laminectomy, Foraminotomy and Resection of Synovial Cyst  . POSTERIOR CERVICAL FUSION/FORAMINOTOMY N/A 01/29/2013   Procedure: POSTERIOR CERVICAL FUSION/FORAMINOTOMY LEVEL 1 and C2-5 Posteriolateral Arthrodesis;  Surgeon: [name], MD;  Location: [place] [facility];  Service: Neurosurgery;  Laterality: N/A;  C2-C3 Laminectomy C2-C3 posterior cervical arthrodesis  . STUMP REVISION Right 01/23/2019   Procedure: REVISION RIGHT BELOW KNEE AMPUTATION;  Surgeon: Newt Minion, MD;  Location: [place];  Service: Orthopedics;  Laterality: Right;  . TONSILLECTOMY      There were no vitals filed for this visit.   Subjective Assessment - 05/11/19 1328    Subjective  This 86yo male was referred by Bevely Palmer Persons, PA on  05/07/2019 s/p right BKA. Would like to start upper body conditioning and Quad strengthening . Should be obtaining prosthetic in next week or 2. He underwent Right Transtibial Amputation on 03/28/2018 & revision on 01/23/2019. He is working with Hubbard Hartshorn, [place] at [employer] with probable delivery ~06/05/19.    Pertinent History  Rt TTA, C2-5 Cervical Fusion 2014, Tachypnea, encephalopathy, RLE paralysis, TIA, A-Fib, CAD, CHF, CKD stage 3, Sz disorder, hx of vertigo, knee arthroplasty Rt X 2 & Lt X 1    Limitations  Lifting;Standing;Walking;House hold activities    Patient Stated Goals  To get prosthesis, go to bathroom alone, get  around house, get out in community.    Currently in Pain?  Yes    Pain Score  7    in last week, worst 8/10, best 4/10   Pain Location  Neck    Pain Orientation  Mid    Pain Descriptors / Indicators  Aching    Pain Type  Chronic pain    Pain Onset  More than a month ago    Pain Frequency  Constant    Aggravating Factors   moving head around    Pain Relieving Factors  medications, moist heat,    Multiple Pain Sites  Yes    Pain Score  2   in last week, worst 8/10, best 0/10   Pain Location  Hip    Pain Orientation  Right    Pain Descriptors / Indicators  Aching    Pain Type  Chronic pain    Pain Onset  More than a month ago    Pain Frequency  Intermittent    Aggravating Factors   sitting in one position too long    Pain Relieving Factors  laying down flat for 10-15 minutes         [facility] PT Assessment - 05/11/19 1320      Assessment   Medical Diagnosis  Right Transtibial Amputation    Referring Provider (PT)  Bevely Palmer Persons, PA    Onset Date/Surgical Date  05/07/19   PA referral to PT   Hand Dominance  Right    Prior Therapy  [facility] March 2020      Precautions   Precautions  Fall      Restrictions   Weight Bearing Restrictions  No      Balance Screen   Has the patient fallen in the past 6 months  Yes    How many times?  2   slipped out of chair   Has the patient had a decrease in activity level because of a fear of falling?   Yes    Is the patient reluctant to leave their home because of a fear of falling?   Yes      [place] residence    Living Arrangements  Spouse/significant other    Type of [place]  One level    [place] - 2 wheels;Walker - 4 wheels;Walker - standard;Cane - single point;Bedside commode;Tub bench;Grab bars - tub/shower;Transport chair;Wheelchair - manual;Hospital bed      Prior Function   Level of Independence  Independent with  household mobility with device;Independent with community mobility with device   community limited by back pain   Vocation  Retired    Leisure  read, time with family      Posture/Postural Control  Posture/Postural Control  Postural limitations    Postural Limitations  Rounded Shoulders;Increased lumbar lordosis;Flexed trunk;Weight shift left      PROM   Overall PROM   Deficits    Overall PROM Comments  limited functional ROM in hips, knees, ankle, shoulders due to chronic arthritis      PROM Assessment Site  Knee;Ankle    Right Knee Extension  -28    Left Knee Extension  -26    Left Ankle Dorsiflexion  -8      Strength   Overall Strength  Deficits    Overall Strength Comments  left shoulder <3/5 due to hx of rotator cuff tear    Right Hip Flexion  3-/5    Right Hip Extension  2+/5   gross testing in sitting   Right Hip ABduction  2+/5   gross testing in sitting   Left Hip Flexion  3-/5    Left Hip Extension  2+/5   gross testing in sitting   Left Hip ABduction  2+/5   gross testing in sitting   Right Knee Flexion  3-/5   gross testing in sitting   Right Knee Extension  3-/5    Left Knee Flexion  3-/5   gross testing in sitting   Left Knee Extension  3-/5    Left Ankle Dorsiflexion  3-/5      Prosthetics Assessment - 05/11/19 1320      Prosthetics   Prosthetic Care Dependent with  Skin check;Residual limb care;Care of non-amputated limb;Prosthetic cleaning;Ply sock cleaning;Correct ply sock adjustment;Proper wear schedule/adjustment;Proper weight-bearing schedule/adjustment    Edema  non-pitting edema     Residual limb condition   small opening no drainage, no color changes or increased temperature, dry skin,     Prosthesis Description  receiving prosthesis in 2-4 weeks. Prosthetist reports silicon liner with pin lock suspension, SACH foot, endoskeletal,     K code/activity level with prosthetic use   K2 basic community with walker & prosthesis                Objective measurements completed on examination: See above findings.      [facility] Adult PT Treatment/Exercise - 05/11/19 1320      Prosthetics   Prosthetic Care Comments   PT spoke to Meridee Score, MD. Bevely Palmer Perons, PA saw patient after PT with potential suture in wound. PA did not find suture in wound.                           Plan - 05/11/19 2149    Clinical Impression Statement  Patient was late to PT session today and due to limited activity tolerance unable to complete evaluation. PT needs to assess transfers and standing balance.    Personal Factors and Comorbidities  Age;Comorbidity 3+;Fitness;Past/Current Experience;Time since onset of injury/illness/exacerbation    Comorbidities  Rt TTA, C2-5 Cervical Fusion 2014, Tachypnea, encephalopathy, RLE paralysis, TIA, A-Fib, CAD, CHF, CKD stage 3, Sz disorder, hx of vertigo, knee arthroplasty Rt X 2 & Lt X 1    Examination-Activity Limitations  Caring for Others;Locomotion Level;Reach Overhead;Squat;Stairs;Stand;Transfers    Examination-Participation Restrictions  Community Activity    Stability/Clinical Decision Making  Evolving/Moderate complexity    Clinical Decision Making  Moderate    Rehab Potential  Good    PT Frequency  2x / week    PT Duration  Other (comment)   25 weeks (6 months)   PT Treatment/Interventions  ADLs/Self  Care Home Management;DME Instruction;Gait training;Stair training;Functional mobility training;Therapeutic activities;Therapeutic exercise;Balance training;Neuromuscular re-education;Patient/family education;Prosthetic Training    PT Next Visit Plan  assess functional mobility, set STGs & LTGs,    Consulted and Agree with Plan of Care  Patient;Family member/caregiver    Family Member Consulted  wife, Bry Campoverde       Patient will benefit from skilled therapeutic intervention in order to improve the following deficits and impairments:  Abnormal gait,  Cardiopulmonary status limiting activity, Decreased activity tolerance, Decreased balance, Decreased endurance, Decreased knowledge of use of DME, Decreased mobility, Decreased range of motion, Decreased skin integrity, Decreased strength, Difficulty walking, Increased edema, Impaired flexibility, Postural dysfunction, Prosthetic Dependency, Pain  Visit Diagnosis: Other abnormalities of gait and mobility  Unsteadiness on feet  Abnormal posture  Muscle weakness  Stiffness of right knee, not elsewhere classified  Stiffness of left knee, not elsewhere classified  Contracture of muscle, multiple sites  Chronic midline low back pain without sciatica  Cervicalgia  History of falling     Problem List Patient Active Problem List   Diagnosis Date Noted  . Dehiscence of amputation stump ([place])   . Wound infection 05/09/2018  . AKI (acute kidney injury) ([place])   . S/P BKA (below knee amputation) unilateral, right ([place])   . Below-knee amputation with complication, initial encounter ([place])   . Tachypnea   . Post-operative pain   . Subacute osteomyelitis, right ankle and foot ([place])   . Paralysis of right lower extremity ([place])   . TIA (transient ischemic attack) 03/24/2018  . Encephalopathy 03/24/2018  . Cellulitis 03/15/2018  . Hypernatremia 03/15/2018  . Persistent atrial fibrillation ([place]) 11/10/2017  . Severe muscle deconditioning 11/10/2017  . Hemorrhage 10/22/2017  . CAD S/P percutaneous coronary angioplasty 10/22/2017  . Acute hypoxemic respiratory failure ([place]) 07/17/2017  . Aspiration syndrome, subsequent encounter 07/11/2017  . Aspiration pneumonia ([place]) 07/01/2017  . Acute encephalopathy 07/01/2017  . Thrombocytopenia ([place]) 07/01/2017  . Chronic diastolic (congestive) heart failure ([place]) 07/01/2017  . Anemia of chronic disease 07/01/2017  . Troponin level elevated 07/01/2017  . Chronic atrial fibrillation 07/01/2017  . Goals of care, counseling/discussion   .  Palliative care by specialist   . Acute metabolic encephalopathy 123456  . CKD (chronic kidney disease) stage 3, GFR 30-59 ml/min   . CAP (community acquired pneumonia) 04/17/2017  . Hallux rigidus, right foot 03/10/2016  . Lumbosacral spondylosis without myelopathy 10/29/2015  . Memory difficulty 09/22/2015  . Abnormality of gait 09/22/2015  . Hyperglycemia 07/06/2015  . Chronic pain 07/06/2015  . Chronic insomnia 03/30/2015  . Transient alteration of awareness 03/30/2015  . Abnormal liver function   . Altered mental status 01/31/2015  . Essential hypertension 01/31/2015  . Constipation 01/31/2015  . Hypothyroidism 01/31/2015  . Seizure disorder ([place]) 01/31/2015  . Bladder outlet obstruction 01/31/2015  . GERD (gastroesophageal reflux disease) 01/31/2015  . Chronic back pain 01/31/2015  . Acute kidney injury ([place]) 01/31/2015    Jamey Reas  PT, DPT 05/11/2019, 9:57 PM  [facility] Physical Therapy [address] [place], [place], [address] Phone: [phone]   Fax:  [phone]  Name: LONNELL BEA MRN: [id number] Date of Birth: [birth year]

## 2019-05-11 NOTE — Progress Notes (Signed)
Office Visit Note   Patient: [name]           Date of Birth: [birth year]           MRN: [id number] Visit Date: 05/11/2019              Requested by: [name], [place] [place],  [place] [address] PCP: [name], MD  Chief Complaint  Patient presents with  . Right Knee - Pain, Follow-up      HPI: Patient presents today to be evaluated for possible suture abscess.  He is status post below-knee amputation.  Physical therapy was concerned that there was a spot across the central portion of the incision that might represent a suture knot.  His wife states that the spot has been there for quite a long time it is not painful for him he has not had any drainage.  The wife also said this has been here for a while when evaluated by both Dr. [name] and myself  Assessment & Plan: Visit Diagnoses: No diagnosis found.  Plan: I had a discussion with his wife.  I could not harvest a suture.  There is no fluctuance or purulent drainage.  It is not painful to palpation there is a pinpoint hole that is not surrounded by any erythema even with trying to express does not express any fluid findings not consistent with a suture abscess  Follow-Up Instructions: No follow-ups on file.   Ortho Exam  Patient is alert, oriented, no adenopathy, well-dressed, normal affect, normal respiratory effort. Focused evaluation of his right below-knee amputation stump incision is completely healed there is one section over the central portion incision that has a small black dot.  There is no surrounding erythema fluctuance or pain.  There is no drainage.  I did try to explore this with a pickup and scissor.  Could not see any suture.  Exploring any deeper would involve further opening the wound.  Since he is not having any pain and he has no drainage or erythema at this point I did not go any further.  His wife was in agreement with this.  I did tell them that if he had any signs of a suture  abscess such as increased drainage purulence or redness they should return  Imaging: No results found. No images are attached to the encounter.  Labs: Lab Results  Component Value Date   HGBA1C 5.7 (H) 03/26/2018   HGBA1C 5.0 07/12/2017   HGBA1C 5.8 (H) 07/06/2015   ESRSEDRATE 24 (H) 03/24/2018   ESRSEDRATE 15 04/18/2017   ESRSEDRATE 5 09/17/2007   CRP 3.1 (H) 05/11/2018   CRP 1.8 (H) 03/24/2018   CRP <0.8 04/24/2017   LABURIC 6.0 03/09/2016   REPTSTATUS 12/22/2018 FINAL 12/20/2018   GRAMSTAIN  07/15/2017    WBC PRESENT,BOTH PMN AND MONONUCLEAR NO ORGANISMS SEEN Performed at [facility], [place] [address] [place], [place] [address]    CULT >=100,000 COLONIES/mL ESCHERICHIA COLI (A) 12/20/2018   LABORGA ESCHERICHIA COLI (A) 12/20/2018     Lab Results  Component Value Date   ALBUMIN 3.6 12/20/2018   ALBUMIN 3.0 (L) 05/09/2018   ALBUMIN 2.5 (L) 03/31/2018   PREALBUMIN 36 (H) 11/22/2017   PREALBUMIN 28.8 04/24/2017   LABURIC 6.0 03/09/2016    Lab Results  Component Value Date   MG 2.0 05/12/2018   MG 1.9 05/11/2018   MG 1.9 03/31/2018   No results found for: [facility]  Lab Results  Component Value Date   PREALBUMIN 36 (H) 11/22/2017   PREALBUMIN 28.8 04/24/2017   CBC EXTENDED Latest Ref Rng & Units 01/23/2019 12/20/2018 11/08/2018  WBC 4.0 - 10.5 K/uL 7.3 11.3(H) 9.6  RBC 4.22 - 5.81 MIL/uL 3.80(L) 3.80(L) 3.29(L)  HGB 13.0 - 17.0 g/dL 12.4(L) 12.5(L) 10.9(L)  HCT 39.0 - 52.0 % 41.2 40.0 34.6(L)  PLT 150 - 400 K/uL 135(L) 132(L) 128(L)  NEUTROABS 1.7 - 7.7 K/uL - 9.6(H) 7.8(H)  LYMPHSABS 0.7 - 4.0 K/uL - 0.6(L) 0.7     There is no height or weight on file to calculate BMI.  Orders:  No orders of the defined types were placed in this encounter.  No orders of the defined types were placed in this encounter.    Procedures: No procedures performed  Clinical Data: No additional findings.  ROS:  All other systems negative, except as noted in the  HPI. Review of Systems  Objective: Vital Signs: There were no vitals taken for this visit.  Specialty Comments:  No specialty comments available.  PMFS History: Patient Active Problem List   Diagnosis Date Noted  . Dehiscence of amputation stump ([place])   . Wound infection 05/09/2018  . AKI (acute kidney injury) ([place])   . S/P BKA (below knee amputation) unilateral, right ([place])   . Below-knee amputation with complication, initial encounter ([place])   . Tachypnea   . Post-operative pain   . Subacute osteomyelitis, right ankle and foot ([place])   . Paralysis of right lower extremity ([place])   . TIA (transient ischemic attack) 03/24/2018  . Encephalopathy 03/24/2018  . Cellulitis 03/15/2018  . Hypernatremia 03/15/2018  . Persistent atrial fibrillation ([place]) 11/10/2017  . Severe muscle deconditioning 11/10/2017  . Hemorrhage 10/22/2017  . CAD S/P percutaneous coronary angioplasty 10/22/2017  . Acute hypoxemic respiratory failure ([place]) 07/17/2017  . Aspiration syndrome, subsequent encounter 07/11/2017  . Aspiration pneumonia ([place]) 07/01/2017  . Acute encephalopathy 07/01/2017  . Thrombocytopenia ([place]) 07/01/2017  . Chronic diastolic (congestive) heart failure ([place]) 07/01/2017  . Anemia of chronic disease 07/01/2017  . Troponin level elevated 07/01/2017  . Chronic atrial fibrillation 07/01/2017  . Goals of care, counseling/discussion   . Palliative care by specialist   . Acute metabolic encephalopathy 123456  . CKD (chronic kidney disease) stage 3, GFR 30-59 ml/min   . CAP (community acquired pneumonia) 04/17/2017  . Hallux rigidus, right foot 03/10/2016  . Lumbosacral spondylosis without myelopathy 10/29/2015  . Memory difficulty 09/22/2015  . Abnormality of gait 09/22/2015  . Hyperglycemia 07/06/2015  . Chronic pain 07/06/2015  . Chronic insomnia 03/30/2015  . Transient alteration of awareness 03/30/2015  . Abnormal liver function   . Altered mental status 01/31/2015  .  Essential hypertension 01/31/2015  . Constipation 01/31/2015  . Hypothyroidism 01/31/2015  . Seizure disorder ([place]) 01/31/2015  . Bladder outlet obstruction 01/31/2015  . GERD (gastroesophageal reflux disease) 01/31/2015  . Chronic back pain 01/31/2015  . Acute kidney injury ([place]) 01/31/2015   Past Medical History:  Diagnosis Date  . Abnormality of gait 09/22/2015  . Arthritis   . Atrial fibrillation ([place])   . CAD (coronary artery disease)    Stent to RCA, Penta stent, 99% reduced to 0% 2002.  . Cancer ([place])    skin CA removed from back  . Chronic insomnia 03/30/2015  . Complication of anesthesia    trouble waking up  . GERD (gastroesophageal reflux disease)   . Hypercholesteremia   . Hypertension   . Hypothyroidism   .  Memory difficulty 09/22/2015  . Osteoarthritis   . Pneumonia   . Seizures ([place])   . Sepsis ([place]) 05/2017  . Transient alteration of awareness 03/30/2015  . Vertigo    hx of    Family History  Problem Relation Age of Onset  . Hypertension Mother   . Cancer Mother   . Kidney failure Father   . Heart disease Father     Past Surgical History:  Procedure Laterality Date  . AMPUTATION Right 03/28/2018   Procedure: AMPUTATION BELOW KNEE;  Surgeon: Newt Minion, MD;  Location: [facility];  Service: Orthopedics;  Laterality: Right;  . APPLICATION OF WOUND VAC Right 01/23/2019   Procedure: Application Of  Prevena Wound Vac;  Surgeon: Newt Minion, MD;  Location: [place];  Service: Orthopedics;  Laterality: Right;  . BACK SURGERY    . EYE SURGERY     Bilateral Cataract surgery   . HERNIA REPAIR    . I & D EXTREMITY Right 05/10/2015   Procedure: IRRIGATION AND DEBRIDEMENT EXTREMITY;  Surgeon: Leanora Cover, MD;  Location: [facility];  Service: Orthopedics;  Laterality: Right;  . KNEE ARTHROPLASTY     right knee X 2; left knee once  . LAMINECTOMY     X 6  . LEG AMPUTATION BELOW KNEE Right 03/28/2018  . POSTERIOR CERVICAL FUSION/FORAMINOTOMY  01/28/2012    Procedure: POSTERIOR CERVICAL FUSION/FORAMINOTOMY LEVEL 3;  Surgeon: [name], MD;  Location: [place] [facility];  Service: Neurosurgery;  Laterality: Left;  Posterior Cervical Five-Thoracic One Fusion, Arthrodesis with LEFT Cervical Seven-thoracic One Laminectomy, Foraminotomy and Resection of Synovial Cyst  . POSTERIOR CERVICAL FUSION/FORAMINOTOMY N/A 01/29/2013   Procedure: POSTERIOR CERVICAL FUSION/FORAMINOTOMY LEVEL 1 and C2-5 Posteriolateral Arthrodesis;  Surgeon: [name], MD;  Location: [place] [facility];  Service: Neurosurgery;  Laterality: N/A;  C2-C3 Laminectomy C2-C3 posterior cervical arthrodesis  . STUMP REVISION Right 01/23/2019   Procedure: REVISION RIGHT BELOW KNEE AMPUTATION;  Surgeon: Newt Minion, MD;  Location: [facility];  Service: Orthopedics;  Laterality: Right;  . TONSILLECTOMY     Social History   Occupational History  . Occupation: retired Software engineer  Tobacco Use  . Smoking status: Former Research scientist (life sciences)  . Smokeless tobacco: Never Used  Substance and Sexual Activity  . Alcohol use: Not Currently    Comment: rare  . Drug use: No  . Sexual activity: Not Currently

## 2019-05-12 ENCOUNTER — Encounter: Payer: Self-pay | Admitting: Physical Therapy

## 2019-05-12 ENCOUNTER — Ambulatory Visit (INDEPENDENT_AMBULATORY_CARE_PROVIDER_SITE_OTHER): Payer: Medicare Other | Admitting: Physical Therapy

## 2019-05-12 DIAGNOSIS — R293 Abnormal posture: Secondary | ICD-10-CM | POA: Diagnosis not present

## 2019-05-12 DIAGNOSIS — R2689 Other abnormalities of gait and mobility: Secondary | ICD-10-CM | POA: Diagnosis not present

## 2019-05-12 DIAGNOSIS — M6249 Contracture of muscle, multiple sites: Secondary | ICD-10-CM

## 2019-05-12 DIAGNOSIS — R2681 Unsteadiness on feet: Secondary | ICD-10-CM

## 2019-05-12 DIAGNOSIS — G8929 Other chronic pain: Secondary | ICD-10-CM

## 2019-05-12 DIAGNOSIS — M25662 Stiffness of left knee, not elsewhere classified: Secondary | ICD-10-CM

## 2019-05-12 DIAGNOSIS — M6281 Muscle weakness (generalized): Secondary | ICD-10-CM

## 2019-05-12 DIAGNOSIS — M25661 Stiffness of right knee, not elsewhere classified: Secondary | ICD-10-CM

## 2019-05-12 DIAGNOSIS — Z9181 History of falling: Secondary | ICD-10-CM

## 2019-05-12 DIAGNOSIS — M542 Cervicalgia: Secondary | ICD-10-CM

## 2019-05-12 DIAGNOSIS — M545 Low back pain: Secondary | ICD-10-CM

## 2019-05-12 NOTE — Therapy (Signed)
[place], [place], [address] Phone: [phone]   Fax:  [phone]  Physical Therapy Evaluation  Patient Details  Name: [name] MRN: [id number] Date of Birth: [birth year] Referring Provider (PT): Bevely Palmer Persons, [place]   Encounter Date: 05/12/2019  PT End of Session - 05/12/19 1252    Visit Number  2    Number of Visits  50    Date for PT Re-Evaluation  08/06/19    Authorization Type  BCBS & Medicare    PT Start Time  1152    PT Stop Time  1240    PT Time Calculation (min)  48 min    Equipment Utilized During Treatment  Gait belt    Activity Tolerance  Patient tolerated treatment well    Behavior During Therapy  [facility] for tasks assessed/performed       Past Medical History:  Diagnosis Date  . Abnormality of gait 09/22/2015  . Arthritis   . Atrial fibrillation ([place])   . CAD (coronary artery disease)    Stent to RCA, Penta stent, 99% reduced to 0% 2002.  . Cancer ([place])    skin CA removed from back  . Chronic insomnia 03/30/2015  . Complication of anesthesia    trouble waking up  . GERD (gastroesophageal reflux disease)   . Hypercholesteremia   . Hypertension   . Hypothyroidism   . Memory difficulty 09/22/2015  . Osteoarthritis   . Pneumonia   . Seizures ([place])   . Sepsis ([place]) 05/2017  . Transient alteration of awareness 03/30/2015  . Vertigo    hx of    Past Surgical History:  Procedure Laterality Date  . AMPUTATION Right 03/28/2018   Procedure: AMPUTATION BELOW KNEE;  Surgeon: Newt Minion, MD;  Location: [place];  Service: Orthopedics;  Laterality: Right;  . APPLICATION OF WOUND VAC Right 01/23/2019   Procedure: Application Of  Prevena Wound Vac;  Surgeon: Newt Minion, MD;  Location: [place];  Service: Orthopedics;  Laterality: Right;  . BACK SURGERY    . EYE SURGERY     Bilateral Cataract surgery   . HERNIA REPAIR    . I & D EXTREMITY Right 05/10/2015   Procedure: IRRIGATION AND DEBRIDEMENT  EXTREMITY;  Surgeon: Leanora Cover, MD;  Location: [place];  Service: Orthopedics;  Laterality: Right;  . KNEE ARTHROPLASTY     right knee X 2; left knee once  . LAMINECTOMY     X 6  . LEG AMPUTATION BELOW KNEE Right 03/28/2018  . POSTERIOR CERVICAL FUSION/FORAMINOTOMY  01/28/2012   Procedure: POSTERIOR CERVICAL FUSION/FORAMINOTOMY LEVEL 3;  Surgeon: [name], MD;  Location: [place] [facility];  Service: Neurosurgery;  Laterality: Left;  Posterior Cervical Five-Thoracic One Fusion, Arthrodesis with LEFT Cervical Seven-thoracic One Laminectomy, Foraminotomy and Resection of Synovial Cyst  . POSTERIOR CERVICAL FUSION/FORAMINOTOMY N/A 01/29/2013   Procedure: POSTERIOR CERVICAL FUSION/FORAMINOTOMY LEVEL 1 and C2-5 Posteriolateral Arthrodesis;  Surgeon: [name], MD;  Location: [place] [facility];  Service: Neurosurgery;  Laterality: N/A;  C2-C3 Laminectomy C2-C3 posterior cervical arthrodesis  . STUMP REVISION Right 01/23/2019   Procedure: REVISION RIGHT BELOW KNEE AMPUTATION;  Surgeon: Newt Minion, MD;  Location: [place];  Service: Orthopedics;  Laterality: Right;  . TONSILLECTOMY      There were no vitals filed for this visit.   Subjective Assessment - 05/12/19 1153    Subjective  This 86yo male was referred by Bevely Palmer Persons, PA on  05/07/2019 s/p right BKA. Would like to start upper body conditioning and Quad strengthening . Should be obtaining prosthetic in next week or 2. He underwent Right Transtibial Amputation on 03/28/2018 & revision on 01/23/2019. He is working with Hubbard Hartshorn, [place] at [employer] with probable delivery ~06/05/19.    Pertinent History  Rt TTA, C2-5 Cervical Fusion 2014, Tachypnea, encephalopathy, RLE paralysis, TIA, A-Fib, CAD, CHF, CKD stage 3, Sz disorder, hx of vertigo, knee arthroplasty Rt X 2 & Lt X 1    Limitations  Lifting;Standing;Walking;House hold activities    Patient Stated Goals  To get prosthesis, go to bathroom alone, get  around house, get out in community.    Pain Onset  More than a month ago    Pain Onset  More than a month ago         [facility] PT Assessment - 05/12/19 1155      Assessment   Medical Diagnosis  Right Transtibial Amputation    Referring Provider (PT)  Bevely Palmer Persons, PA    Onset Date/Surgical Date  05/07/19   PA referral to PT   Hand Dominance  Right    Prior Therapy  [facility] March 2020      Precautions   Precautions  Fall      Restrictions   Weight Bearing Restrictions  No      Balance Screen   Has the patient fallen in the past 6 months  Yes    How many times?  2   slid out of w/c denies injuries   Has the patient had a decrease in activity level because of a fear of falling?   Yes    Is the patient reluctant to leave their home because of a fear of falling?   Yes      [place] residence    Living Arrangements  Spouse/significant other    Type of [place]  One level    [place] - 2 wheels;Walker - 4 wheels;Walker - standard;Cane - single point;Bedside commode;Tub bench;Grab bars - tub/shower;Transport chair;Wheelchair - manual;Hospital bed      Prior Function   Level of Independence  Independent with household mobility with device;Independent with community mobility with device   community limited by back pain   Vocation  Retired    Leisure  read, time with family      Posture/Postural Control   Posture/Postural Control  Postural limitations    Postural Limitations  Rounded Shoulders;Increased lumbar lordosis;Flexed trunk;Weight shift left      PROM   Overall PROM   Deficits    Overall PROM Comments  limited functional ROM in hips, knees, ankle, shoulders due to chronic arthritis      Right Knee Extension  -28    Left Knee Extension  -26    Left Ankle Dorsiflexion  -8      Strength   Overall Strength  Deficits    Overall Strength Comments  left shoulder  <3/5 due to hx of rotator cuff tear    Right Hip Flexion  3-/5    Right Hip Extension  2+/5   gross testing in sitting   Right Hip ABduction  2+/5   gross testing in sitting   Left Hip Flexion  3-/5    Left Hip Extension  2+/5   gross testing in sitting   Left Hip  ABduction  2+/5   gross testing in sitting   Right Knee Flexion  3-/5   gross testing in sitting   Right Knee Extension  3-/5    Left Knee Flexion  3-/5   gross testing in sitting   Left Knee Extension  3-/5    Left Ankle Dorsiflexion  3-/5      Transfers   Transfers  Squat Pivot Transfers;Sit to Stand;Stand to Sit    Sit to Stand  3: Mod assist;With upper extremity assist;From chair/3-in-1;Other (comment)   to sink pulling on sink   Stand to Sit  4: Min assist;With upper extremity assist;To chair/3-in-1   using sink to lower into w/c   Squat Pivot Transfers  3: Mod assist;4: Min assist;With upper extremity assistance;With armrests   transport w/c armrests do not come off w/c   Squat Pivot Transfer Details (indicate cue type and reason)  sharp angle between w/c seat & mat table (2" lower) to enable more of scooting transfer as armrests on his transport w/c are not removable.       Ambulation/Gait   Ambulation/Gait  --      Balance   Balance Assessed  Yes      Static Sitting Balance   Static Sitting - Balance Support  No upper extremity supported;Feet supported   LLE supported on floor, RLE is BKA with no prosthesis yet   Static Sitting - Level of Assistance  5: Stand by assistance    Static Sitting - Comment/# of Minutes  2      Dynamic Sitting Balance   Dynamic Sitting - Balance Support  No upper extremity supported;Feet supported    Dynamic Sitting - Level of Assistance  5: Stand by assistance    Reach (Patient is able to reach ___ inches to right, left, forward, back)  reaches 4" anteriorly    Dynamic Sitting - Balance Activities  Head control activities    Dynamic Sitting balance - Comments  looks  right/left & up/down with minimal upper trunk motion      Static Standing Balance   Static Standing - Balance Support  Bilateral upper extremity supported   Right BKA kneeling in w/c for support   Static Standing - Level of Assistance  4: Min assist    Static Standing - Comment/# of Minutes  40 seconds & 60 seconds      Dynamic Standing Balance   Dynamic Standing - Balance Support  Bilateral upper extremity supported;Left upper extremity supported;Right upper extremity supported;During functional activity   right BKA kneeling in w/c for support   Dynamic Standing - Level of Assistance  4: Min assist    Dynamic Standing - Comments  scans right/left & up/down with cervical motion and reaches to back of sink (~2" beyond arm length) for <3 second release                Objective measurements completed on examination: See above findings.      [facility] Adult PT Treatment/Exercise - 05/12/19 1155      Neck Exercises: Seated   Neck Retraction  5 reps;5 secs   2 sets   Neck Retraction Limitations  pillow behind head for closed chain, verbal cues to not hold breath and keeping chin level.       Shoulder Exercises: Seated   Other Seated Exercises  w/c push up off wheels 5 sec hold 5 reps 2 sets             PT Education -  05/12/19 1401    Education Details  Initial HEP Access Code: [email or web address]    Person(s) Educated  Patient;Spouse    Methods  Explanation;Demonstration;Tactile cues;Verbal cues;Handout    Comprehension  Verbalized understanding;Returned demonstration;Verbal cues required;Tactile cues required;Need further instruction       PT Short Term Goals - 05/12/19 1411      PT SHORT TERM GOAL #1   Title  Patient & wife demonstrate understanding of initial HEP. (All Target STGs: 06/11/2019)    Time  1    Period  Months    Status  New    Target Date  06/11/19      PT SHORT TERM GOAL #2   Title  Patient tolerates standing at sink with right BKA kneeling in chair for 75  seconds with min guard.    Time  1    Period  Months    Status  New    Target Date  06/11/19      PT SHORT TERM GOAL #3   Title  Patient's wife is able to donne prosthesis correctly.    Time  1    Period  Months    Status  New    Target Date  06/11/19      PT SHORT TERM GOAL #4   Title  Sit to/from stand w/c with armrests to RW with modA.    Time  1    Period  Months    Status  New    Target Date  06/11/19        PT Long Term Goals - 05/12/19 1405      PT LONG TERM GOAL #1   Title  Patient and wife verbalize & demonstrate understanding of proper prosthetic care to enable safe utilization of prosthesis. (All LTGs Target Date: 11/03/2019)    Time  6    Period  Months    Status  New    Target Date  11/03/19      PT LONG TERM GOAL #2   Title  Patient tolerates wear of prosthesis >90% of awake hours without skin or residual limb pain issues to enable function during his day.    Time  6    Period  Months    Status  New    Target Date  11/03/19      PT LONG TERM GOAL #3   Title  Standing balance with RW support: maintains upright 2 minutes, reaches 5" anteriorly and scans environment with supervision.    Time  6    Period  Months    Status  New    Target Date  11/03/19      PT LONG TERM GOAL #4   Title  Patient ambulates 100' with RW & prosthesis with wife's assistance / supervision and turns 180* to position to sit safely.    Time  6    Period  Months    Status  New    Target Date  11/03/19      PT LONG TERM GOAL #5   Title  Sit to/from stand and stand-pivot transfers with RW & prosthesis with supervision.    Time  6    Period  Months    Status  New    Target Date  11/03/19             Plan - 05/12/19 2107    Clinical Impression Statement  This 86yo male underwent right Transtibial Amputation ~14 months ago with slow healing. He has significant  deconditioning from multiple medical conditions and significantly limited mobility while awaiting healing of  residual limb to receive his first prosthesis.  He is working with [employer] and should receive the prosthesis by end of month. Patient & his wife are dependent in all aspects of prosthetic care.  He has impaired balance with high fall risk. He is currently non-ambulatory.  Patient would benefit from skilled PT to improve function & mobility.    Personal Factors and Comorbidities  Age;Comorbidity 3+;Fitness;Past/Current Experience;Time since onset of injury/illness/exacerbation    Comorbidities  Rt TTA, C2-5 Cervical Fusion 2014, Tachypnea, encephalopathy, RLE paralysis, TIA, A-Fib, CAD, CHF, CKD stage 3, Sz disorder, hx of vertigo, knee arthroplasty Rt X 2 & Lt X 1    Examination-Activity Limitations  Caring for Others;Locomotion Level;Reach Overhead;Squat;Stairs;Stand;Transfers    Examination-Participation Restrictions  Community Activity    Stability/Clinical Decision Making  Evolving/Moderate complexity    Clinical Decision Making  Moderate    Rehab Potential  Good    PT Frequency  2x / week    PT Duration  Other (comment)   25 weeks (6 months)   PT Treatment/Interventions  ADLs/Self Care Home Management;DME Instruction;Gait training;Stair training;Functional mobility training;Therapeutic activities;Therapeutic exercise;Balance training;Neuromuscular re-education;Patient/family education;Prosthetic Training    PT Next Visit Plan  work towards [place],  check HEP & progress, [employer]    Consulted and Agree with Plan of Care  Patient;Family member/caregiver    Family Member Consulted  wife, Rangel Cannada       Patient will benefit from skilled therapeutic intervention in order to improve the following deficits and impairments:  Abnormal gait, Cardiopulmonary status limiting activity, Decreased activity tolerance, Decreased balance, Decreased endurance, Decreased knowledge of use of DME, Decreased mobility, Decreased range of motion, Decreased skin integrity, Decreased strength, Difficulty walking,  Increased edema, Impaired flexibility, Postural dysfunction, Prosthetic Dependency, Pain  Visit Diagnosis: Other abnormalities of gait and mobility  Unsteadiness on feet  Abnormal posture  Muscle weakness  Stiffness of right knee, not elsewhere classified  Stiffness of left knee, not elsewhere classified  Contracture of muscle, multiple sites  Chronic midline low back pain without sciatica  Cervicalgia  History of falling     Problem List Patient Active Problem List   Diagnosis Date Noted  . Dehiscence of amputation stump ([place])   . Wound infection 05/09/2018  . AKI (acute kidney injury) ([place])   . S/P BKA (below knee amputation) unilateral, right ([place])   . Below-knee amputation with complication, initial encounter ([place])   . Tachypnea   . Post-operative pain   . Subacute osteomyelitis, right ankle and foot ([place])   . Paralysis of right lower extremity ([place])   . TIA (transient ischemic attack) 03/24/2018  . Encephalopathy 03/24/2018  . Cellulitis 03/15/2018  . Hypernatremia 03/15/2018  . Persistent atrial fibrillation ([place]) 11/10/2017  . Severe muscle deconditioning 11/10/2017  . Hemorrhage 10/22/2017  . CAD S/P percutaneous coronary angioplasty 10/22/2017  . Acute hypoxemic respiratory failure ([place]) 07/17/2017  . Aspiration syndrome, subsequent encounter 07/11/2017  . Aspiration pneumonia ([place]) 07/01/2017  . Acute encephalopathy 07/01/2017  . Thrombocytopenia ([place]) 07/01/2017  . Chronic diastolic (congestive) heart failure ([place]) 07/01/2017  . Anemia of chronic disease 07/01/2017  . Troponin level elevated 07/01/2017  . Chronic atrial fibrillation 07/01/2017  . Goals of care, counseling/discussion   . Palliative care by specialist   . Acute metabolic encephalopathy [id number]  . CKD (chronic kidney disease) stage 3, GFR 30-59 ml/min   . CAP (community acquired pneumonia) 04/17/2017  .  Hallux rigidus, right foot 03/10/2016  . Lumbosacral spondylosis without  myelopathy 10/29/2015  . Memory difficulty 09/22/2015  . Abnormality of gait 09/22/2015  . Hyperglycemia 07/06/2015  . Chronic pain 07/06/2015  . Chronic insomnia 03/30/2015  . Transient alteration of awareness 03/30/2015  . Abnormal liver function   . Altered mental status 01/31/2015  . Essential hypertension 01/31/2015  . Constipation 01/31/2015  . Hypothyroidism 01/31/2015  . Seizure disorder ([place]) 01/31/2015  . Bladder outlet obstruction 01/31/2015  . GERD (gastroesophageal reflux disease) 01/31/2015  . Chronic back pain 01/31/2015  . Acute kidney injury ([place]) 01/31/2015    Jamey Reas PT, DPT 05/12/2019, 9:17 PM  [facility] [address] [place], [place], [address] Phone: [phone]   Fax:  [phone]  Name: FINLEY WHEATLEY MRN: [id number] Date of Birth: May 25, 1933

## 2019-05-12 NOTE — Patient Instructions (Signed)
Access Code: [email or web address]  URL: [email or web address]  Date: 05/12/2019  Prepared by: Jamey Reas   Exercises Seated Chair Push Ups - 5 reps - 2 sets - 5 seconds hold - 2x daily                            - 7x weekly Seated Isometric Cervical Retraction with Chin Tucks with Pillow behind head - 5 reps - 2 sets - 5 seconds hold - 2x daily - 7x weekly

## 2019-05-18 ENCOUNTER — Ambulatory Visit (INDEPENDENT_AMBULATORY_CARE_PROVIDER_SITE_OTHER): Payer: Medicare Other | Admitting: Podiatry

## 2019-05-18 ENCOUNTER — Encounter: Payer: Self-pay | Admitting: Physical Therapy

## 2019-05-18 ENCOUNTER — Ambulatory Visit: Payer: Medicare Other

## 2019-05-18 ENCOUNTER — Ambulatory Visit (INDEPENDENT_AMBULATORY_CARE_PROVIDER_SITE_OTHER): Payer: Medicare Other | Admitting: Physical Therapy

## 2019-05-18 ENCOUNTER — Other Ambulatory Visit: Payer: Self-pay

## 2019-05-18 DIAGNOSIS — R2689 Other abnormalities of gait and mobility: Secondary | ICD-10-CM

## 2019-05-18 DIAGNOSIS — Z89511 Acquired absence of right leg below knee: Secondary | ICD-10-CM

## 2019-05-18 DIAGNOSIS — M79675 Pain in left toe(s): Secondary | ICD-10-CM | POA: Diagnosis not present

## 2019-05-18 DIAGNOSIS — R2681 Unsteadiness on feet: Secondary | ICD-10-CM

## 2019-05-18 DIAGNOSIS — G8929 Other chronic pain: Secondary | ICD-10-CM

## 2019-05-18 DIAGNOSIS — I739 Peripheral vascular disease, unspecified: Secondary | ICD-10-CM

## 2019-05-18 DIAGNOSIS — B351 Tinea unguium: Secondary | ICD-10-CM | POA: Diagnosis not present

## 2019-05-18 DIAGNOSIS — M6281 Muscle weakness (generalized): Secondary | ICD-10-CM | POA: Diagnosis not present

## 2019-05-18 DIAGNOSIS — R293 Abnormal posture: Secondary | ICD-10-CM | POA: Diagnosis not present

## 2019-05-18 DIAGNOSIS — M545 Low back pain, unspecified: Secondary | ICD-10-CM

## 2019-05-18 DIAGNOSIS — M25662 Stiffness of left knee, not elsewhere classified: Secondary | ICD-10-CM

## 2019-05-18 DIAGNOSIS — M25661 Stiffness of right knee, not elsewhere classified: Secondary | ICD-10-CM

## 2019-05-18 DIAGNOSIS — M542 Cervicalgia: Secondary | ICD-10-CM

## 2019-05-18 DIAGNOSIS — M6249 Contracture of muscle, multiple sites: Secondary | ICD-10-CM

## 2019-05-18 DIAGNOSIS — Z9181 History of falling: Secondary | ICD-10-CM

## 2019-05-18 NOTE — Therapy (Signed)
[facility] Physical Therapy [address] [place], [place], [address] Phone: [phone]   Fax:  [phone]  Physical Therapy Treatment  Patient Details  Name: [name] MRN: [id number] Date of Birth: [birth year] Referring Provider (PT): Bevely Palmer Persons, [place]   Encounter Date: 05/18/2019  PT End of Session - 05/18/19 1243    Visit Number  3    Number of Visits  50    Date for PT Re-Evaluation  08/06/19    Authorization Type  BCBS & Medicare    PT Start Time  1157    PT Stop Time  1235    PT Time Calculation (min)  38 min    Equipment Utilized During Treatment  Gait belt    Activity Tolerance  Patient tolerated treatment well    Behavior During Therapy  WFL for tasks assessed/performed       Past Medical History:  Diagnosis Date  . Abnormality of gait 09/22/2015  . Arthritis   . Atrial fibrillation ([place])   . CAD (coronary artery disease)    Stent to RCA, Penta stent, 99% reduced to 0% 2002.  . Cancer ([place])    skin CA removed from back  . Chronic insomnia 03/30/2015  . Complication of anesthesia    trouble waking up  . GERD (gastroesophageal reflux disease)   . Hypercholesteremia   . Hypertension   . Hypothyroidism   . Memory difficulty 09/22/2015  . Osteoarthritis   . Pneumonia   . Seizures ([place])   . Sepsis ([place]) 05/2017  . Transient alteration of awareness 03/30/2015  . Vertigo    hx of    Past Surgical History:  Procedure Laterality Date  . AMPUTATION Right 03/28/2018   Procedure: AMPUTATION BELOW KNEE;  Surgeon: Newt Minion, MD;  Location: [place];  Service: Orthopedics;  Laterality: Right;  . APPLICATION OF WOUND VAC Right 01/23/2019   Procedure: Application Of  Prevena Wound Vac;  Surgeon: Newt Minion, MD;  Location: [place];  Service: Orthopedics;  Laterality: Right;  . BACK SURGERY    . EYE SURGERY     Bilateral Cataract surgery   . HERNIA REPAIR    . I & D EXTREMITY Right 05/10/2015   Procedure: IRRIGATION AND DEBRIDEMENT  EXTREMITY;  Surgeon: Leanora Cover, MD;  Location: [place];  Service: Orthopedics;  Laterality: Right;  . KNEE ARTHROPLASTY     right knee X 2; left knee once  . LAMINECTOMY     X 6  . LEG AMPUTATION BELOW KNEE Right 03/28/2018  . POSTERIOR CERVICAL FUSION/FORAMINOTOMY  01/28/2012   Procedure: POSTERIOR CERVICAL FUSION/FORAMINOTOMY LEVEL 3;  Surgeon: [name], MD;  Location: [place] [facility];  Service: Neurosurgery;  Laterality: Left;  Posterior Cervical Five-Thoracic One Fusion, Arthrodesis with LEFT Cervical Seven-thoracic One Laminectomy, Foraminotomy and Resection of Synovial Cyst  . POSTERIOR CERVICAL FUSION/FORAMINOTOMY N/A 01/29/2013   Procedure: POSTERIOR CERVICAL FUSION/FORAMINOTOMY LEVEL 1 and C2-5 Posteriolateral Arthrodesis;  Surgeon: [name], MD;  Location: [place] [facility];  Service: Neurosurgery;  Laterality: N/A;  C2-C3 Laminectomy C2-C3 posterior cervical arthrodesis  . STUMP REVISION Right 01/23/2019   Procedure: REVISION RIGHT BELOW KNEE AMPUTATION;  Surgeon: Newt Minion, MD;  Location: [place];  Service: Orthopedics;  Laterality: Right;  . TONSILLECTOMY      There were no vitals filed for this visit.  Subjective Assessment - 05/18/19 1200    Subjective  He has appointment with podiatrist today as soreness with middle toe.  Wife feels wound on residual limb is slightly bigger. He has appointment with prosthetist this Friday.    Pertinent History  Rt TTA, C2-5 Cervical Fusion 2014, Tachypnea, encephalopathy, RLE paralysis, TIA, A-Fib, CAD, CHF, CKD stage 3, Sz disorder, hx of vertigo, knee arthroplasty Rt X 2 & Lt X 1    Limitations  Lifting;Standing;Walking;House hold activities    Patient Stated Goals  To get prosthesis, go to bathroom alone, get around house, get out in community.    Currently in Pain?  Yes    Pain Score  5     Pain Location  Leg   residual limb   Pain Orientation  Right    Pain Descriptors / Indicators  Aching    Pain  Type  Chronic pain    Pain Onset  More than a month ago    Pain Frequency  Constant    Pain Onset  More than a month ago                       [facility] Adult PT Treatment/Exercise - 05/18/19 1200      Transfers   Transfers  Squat Pivot Transfers    Sit to Stand  4: Min assist;With upper extremity assist;From chair/3-in-1;Other (comment)   pulling on sink   Stand to Sit  4: Min guard;With upper extremity assist;To chair/3-in-1;Other (comment)   using sink to control   Squat Pivot Transfers  4: Min assist;With upper extremity assistance   w/c <> NuStep 2" higher w/c armrest removed      Neuro Re-ed    Neuro Re-ed Details   standing at sink with right kneeling in w/c seat with BUE support with min guard 2 min 2 sets  HR 100 SpO2 99%      Knee/Hip Exercises: Aerobic   Nustep  Level 3 with BUEs & LLE 74min 2 sets with 2 min rest.  Resting HR 72 SpO2 94%, after 1st set HR 81 SpO2 99%, after 2nd set HR 81 SpO2 100%       Shoulder Exercises: Seated   Other Seated Exercises  w/c push up off wheels 5 sec hold 5 reps 2 sets      Prosthetics   Residual limb condition   small opening same size no drainage with slight increase redness proximal edge, no color changes or increased temperature, dry skin,     Education Provided  Skin check    Person(s) Educated  Patient;Spouse    Education Method  Explanation;Verbal cues    Education Method  Verbalized understanding               PT Short Term Goals - 05/18/19 1243      PT SHORT TERM GOAL #1   Title  Patient & wife demonstrate understanding of initial HEP. (All Target STGs: 06/11/2019)    Time  1    Period  Months    Status  On-going    Target Date  06/11/19      PT SHORT TERM GOAL #2   Title  Patient tolerates standing at sink with right BKA kneeling in chair for 75 seconds with min guard.    Time  1    Period  Months    Status  On-going    Target Date  06/11/19      PT SHORT TERM GOAL #3   Title  Patient's wife  is able to donne prosthesis correctly.    Time  1  Period  Months    Status  On-going    Target Date  06/11/19      PT SHORT TERM GOAL #4   Title  Sit to/from stand w/c with armrests to RW with modA.    Time  1    Period  Months    Status  On-going    Target Date  06/11/19        PT Long Term Goals - 05/18/19 1244      PT LONG TERM GOAL #1   Title  Patient and wife verbalize & demonstrate understanding of proper prosthetic care to enable safe utilization of prosthesis. (All LTGs Target Date: 11/03/2019)    Time  6    Period  Months    Status  On-going    Target Date  11/03/19      PT LONG TERM GOAL #2   Title  Patient tolerates wear of prosthesis >90% of awake hours without skin or residual limb pain issues to enable function during his day.    Time  6    Period  Months    Status  On-going    Target Date  11/03/19      PT LONG TERM GOAL #3   Title  Standing balance with RW support: maintains upright 2 minutes, reaches 5" anteriorly and scans environment with supervision.    Time  6    Period  Months    Status  On-going    Target Date  11/03/19      PT LONG TERM GOAL #4   Title  Patient ambulates 100' with RW & prosthesis with wife's assistance / supervision and turns 180* to position to sit safely.    Time  6    Period  Months    Status  On-going    Target Date  11/03/19      PT LONG TERM GOAL #5   Title  Sit to/from stand and stand-pivot transfers with RW & prosthesis with supervision.    Time  6    Period  Months    Status  On-going    Target Date  11/03/19            Plan - 05/18/19 1245    Clinical Impression Statement  PT added NuStep to exercise program and patient tolerated 3 min 2 sets without any issues. He improved his sit to stand pulling on sink and standing with right knee kneeling for 2 minutes today.    Personal Factors and Comorbidities  Age;Comorbidity 3+;Fitness;Past/Current Experience;Time since onset of injury/illness/exacerbation     Comorbidities  Rt TTA, C2-5 Cervical Fusion 2014, Tachypnea, encephalopathy, RLE paralysis, TIA, A-Fib, CAD, CHF, CKD stage 3, Sz disorder, hx of vertigo, knee arthroplasty Rt X 2 & Lt X 1    Examination-Activity Limitations  Caring for Others;Locomotion Level;Reach Overhead;Squat;Stairs;Stand;Transfers    Examination-Participation Restrictions  Community Activity    Stability/Clinical Decision Making  Evolving/Moderate complexity    Rehab Potential  Good    PT Frequency  2x / week    PT Duration  Other (comment)   25 weeks (6 months)   PT Treatment/Interventions  ADLs/Self Care Home Management;DME Instruction;Gait training;Stair training;Functional mobility training;Therapeutic activities;Therapeutic exercise;Balance training;Neuromuscular re-education;Patient/family education;Prosthetic Training    PT Next Visit Plan  work towards [place],  check HEP & progress, NuStep    Consulted and Agree with Plan of Care  Patient;Family member/caregiver    Family Member Consulted  wife, Tyshon Touma  Patient will benefit from skilled therapeutic intervention in order to improve the following deficits and impairments:  Abnormal gait, Cardiopulmonary status limiting activity, Decreased activity tolerance, Decreased balance, Decreased endurance, Decreased knowledge of use of DME, Decreased mobility, Decreased range of motion, Decreased skin integrity, Decreased strength, Difficulty walking, Increased edema, Impaired flexibility, Postural dysfunction, Prosthetic Dependency, Pain  Visit Diagnosis: Other abnormalities of gait and mobility  Unsteadiness on feet  Abnormal posture  Muscle weakness  Stiffness of right knee, not elsewhere classified  Stiffness of left knee, not elsewhere classified  Contracture of muscle, multiple sites  Chronic midline low back pain without sciatica  Cervicalgia  History of falling     Problem List Patient Active Problem List   Diagnosis Date Noted  .  Dehiscence of amputation stump ([place])   . Wound infection 05/09/2018  . AKI (acute kidney injury) ([place])   . S/P BKA (below knee amputation) unilateral, right ([place])   . Below-knee amputation with complication, initial encounter ([place])   . Tachypnea   . Post-operative pain   . Subacute osteomyelitis, right ankle and foot ([place])   . Paralysis of right lower extremity ([place])   . TIA (transient ischemic attack) 03/24/2018  . Encephalopathy 03/24/2018  . Cellulitis 03/15/2018  . Hypernatremia 03/15/2018  . Persistent atrial fibrillation ([place]) 11/10/2017  . Severe muscle deconditioning 11/10/2017  . Hemorrhage 10/22/2017  . CAD S/P percutaneous coronary angioplasty 10/22/2017  . Acute hypoxemic respiratory failure ([place]) 07/17/2017  . Aspiration syndrome, subsequent encounter 07/11/2017  . Aspiration pneumonia ([place]) 07/01/2017  . Acute encephalopathy 07/01/2017  . Thrombocytopenia ([place]) 07/01/2017  . Chronic diastolic (congestive) heart failure ([place]) 07/01/2017  . Anemia of chronic disease 07/01/2017  . Troponin level elevated 07/01/2017  . Chronic atrial fibrillation 07/01/2017  . Goals of care, counseling/discussion   . Palliative care by specialist   . Acute metabolic encephalopathy 123456  . CKD (chronic kidney disease) stage 3, GFR 30-59 ml/min   . CAP (community acquired pneumonia) 04/17/2017  . Hallux rigidus, right foot 03/10/2016  . Lumbosacral spondylosis without myelopathy 10/29/2015  . Memory difficulty 09/22/2015  . Abnormality of gait 09/22/2015  . Hyperglycemia 07/06/2015  . Chronic pain 07/06/2015  . Chronic insomnia 03/30/2015  . Transient alteration of awareness 03/30/2015  . Abnormal liver function   . Altered mental status 01/31/2015  . Essential hypertension 01/31/2015  . Constipation 01/31/2015  . Hypothyroidism 01/31/2015  . Seizure disorder ([place]) 01/31/2015  . Bladder outlet obstruction 01/31/2015  . GERD (gastroesophageal reflux disease) 01/31/2015  .  Chronic back pain 01/31/2015  . Acute kidney injury ([place]) 01/31/2015    Jamey Reas PT, DPT 05/18/2019, 12:46 PM  [facility] Physical Therapy [address] [place], [place], [address] Phone: [phone]   Fax:  [phone]  Name: [name] MRN: [id number] Date of Birth: 31-Jul-1933

## 2019-05-21 ENCOUNTER — Encounter: Payer: Self-pay | Admitting: Physical Therapy

## 2019-05-21 ENCOUNTER — Other Ambulatory Visit: Payer: Self-pay

## 2019-05-21 ENCOUNTER — Ambulatory Visit (INDEPENDENT_AMBULATORY_CARE_PROVIDER_SITE_OTHER): Payer: Medicare Other | Admitting: Physical Therapy

## 2019-05-21 DIAGNOSIS — R293 Abnormal posture: Secondary | ICD-10-CM

## 2019-05-21 DIAGNOSIS — M25662 Stiffness of left knee, not elsewhere classified: Secondary | ICD-10-CM | POA: Diagnosis not present

## 2019-05-21 DIAGNOSIS — R2689 Other abnormalities of gait and mobility: Secondary | ICD-10-CM

## 2019-05-21 DIAGNOSIS — G8929 Other chronic pain: Secondary | ICD-10-CM

## 2019-05-21 DIAGNOSIS — M542 Cervicalgia: Secondary | ICD-10-CM

## 2019-05-21 DIAGNOSIS — R2681 Unsteadiness on feet: Secondary | ICD-10-CM

## 2019-05-21 DIAGNOSIS — M6249 Contracture of muscle, multiple sites: Secondary | ICD-10-CM

## 2019-05-21 DIAGNOSIS — M545 Low back pain: Secondary | ICD-10-CM

## 2019-05-21 DIAGNOSIS — M6281 Muscle weakness (generalized): Secondary | ICD-10-CM

## 2019-05-22 NOTE — Therapy (Signed)
[facility] Physical Therapy [address] [place], [place], [address] Phone: [phone]   Fax:  [phone]  Physical Therapy Treatment  Patient Details  Name: [name] MRN: [id number] Date of Birth: 05-Sep-1933 Referring Provider (PT): Bevely Palmer Persons, [place]   Encounter Date: 05/21/2019  PT End of Session - 05/21/19 1421    Visit Number  4    Number of Visits  50    Date for PT Re-Evaluation  08/06/19    Authorization Type  BCBS & Medicare    PT Start Time  1315    PT Stop Time  1400    PT Time Calculation (min)  45 min    Equipment Utilized During Treatment  Gait belt    Activity Tolerance  Patient tolerated treatment well    Behavior During Therapy  [facility] for tasks assessed/performed       Past Medical History:  Diagnosis Date  . Abnormality of gait 09/22/2015  . Arthritis   . Atrial fibrillation ([place])   . CAD (coronary artery disease)    Stent to RCA, Penta stent, 99% reduced to 0% 2002.  . Cancer ([place])    skin CA removed from back  . Chronic insomnia 03/30/2015  . Complication of anesthesia    trouble waking up  . GERD (gastroesophageal reflux disease)   . Hypercholesteremia   . Hypertension   . Hypothyroidism   . Memory difficulty 09/22/2015  . Osteoarthritis   . Pneumonia   . Seizures ([place])   . Sepsis ([place]) 05/2017  . Transient alteration of awareness 03/30/2015  . Vertigo    hx of    Past Surgical History:  Procedure Laterality Date  . AMPUTATION Right 03/28/2018   Procedure: AMPUTATION BELOW KNEE;  Surgeon: Newt Minion, MD;  Location: [place];  Service: Orthopedics;  Laterality: Right;  . APPLICATION OF WOUND VAC Right 01/23/2019   Procedure: Application Of  Prevena Wound Vac;  Surgeon: Newt Minion, MD;  Location: [place];  Service: Orthopedics;  Laterality: Right;  . BACK SURGERY    . EYE SURGERY     Bilateral Cataract surgery   . HERNIA REPAIR    . I & D EXTREMITY Right 05/10/2015   Procedure: IRRIGATION AND DEBRIDEMENT  EXTREMITY;  Surgeon: Leanora Cover, MD;  Location: [place];  Service: Orthopedics;  Laterality: Right;  . KNEE ARTHROPLASTY     right knee X 2; left knee once  . LAMINECTOMY     X 6  . LEG AMPUTATION BELOW KNEE Right 03/28/2018  . POSTERIOR CERVICAL FUSION/FORAMINOTOMY  01/28/2012   Procedure: POSTERIOR CERVICAL FUSION/FORAMINOTOMY LEVEL 3;  Surgeon: [name], MD;  Location: [place] [facility];  Service: Neurosurgery;  Laterality: Left;  Posterior Cervical Five-Thoracic One Fusion, Arthrodesis with LEFT Cervical Seven-thoracic One Laminectomy, Foraminotomy and Resection of Synovial Cyst  . POSTERIOR CERVICAL FUSION/FORAMINOTOMY N/A 01/29/2013   Procedure: POSTERIOR CERVICAL FUSION/FORAMINOTOMY LEVEL 1 and C2-5 Posteriolateral Arthrodesis;  Surgeon: [name], MD;  Location: [place] [facility];  Service: Neurosurgery;  Laterality: N/A;  C2-C3 Laminectomy C2-C3 posterior cervical arthrodesis  . STUMP REVISION Right 01/23/2019   Procedure: REVISION RIGHT BELOW KNEE AMPUTATION;  Surgeon: Newt Minion, MD;  Location: [place];  Service: Orthopedics;  Laterality: Right;  . TONSILLECTOMY      There were no vitals filed for this visit.  Subjective Assessment - 05/21/19 1315    Subjective  He is doing  HEP.    Pertinent History  Rt  TTA, C2-5 Cervical Fusion 2014, Tachypnea, encephalopathy, RLE paralysis, TIA, A-Fib, CAD, CHF, CKD stage 3, Sz disorder, hx of vertigo, knee arthroplasty Rt X 2 & Lt X 1    Limitations  Lifting;Standing;Walking;House hold activities    Patient Stated Goals  To get prosthesis, go to bathroom alone, get around house, get out in community.    Currently in Pain?  Yes    Pain Score  5     Pain Location  Neck    Pain Orientation  Mid    Pain Descriptors / Indicators  Aching;Sore    Pain Type  Chronic pain    Pain Onset  More than a month ago    Pain Frequency  Constant    Aggravating Factors   movements    Pain Relieving Factors  medications    Pain  Onset  More than a month ago                       [facility] Adult PT Treatment/Exercise - 05/21/19 1315      Transfers   Transfers  Squat Pivot Transfers    Sit to Stand  4: Min assist;With upper extremity assist;From chair/3-in-1;Other (comment)   pulling on sink   Stand to Sit  4: Min guard;With upper extremity assist;To chair/3-in-1;Other (comment)   using sink to control   Squat Pivot Transfers  4: Min assist;With upper extremity assistance   w/c <> NuStep 2" higher w/c armrest removed      Neuro Re-ed    Neuro Re-ed Details   standing at sink with right kneeling in w/c seat with BUE support with supervision / min guard 2 min 2 sets  1st set HR 80 SpO2 98% 2nd set HR 83 SpO2 100%      Knee/Hip Exercises: Aerobic   Nustep  Level 3 with BUEs & LLE 57min 2 sets with 2 min rest.  Resting HR 66 SpO2 99%, after 1st set HR 73 SpO2 98%, after 2nd set HR 69 SpO2 99%       Shoulder Exercises: Seated   Other Seated Exercises  w/c push up off wheels 5 sec hold 5 reps 2 sets      Prosthetics   Residual limb condition   small opening same size no drainage with slight increase redness proximal edge, no color changes or increased temperature, dry skin,     Education Provided  Skin check               PT Short Term Goals - 05/18/19 1243      PT SHORT TERM GOAL #1   Title  Patient & wife demonstrate understanding of initial HEP. (All Target STGs: 06/11/2019)    Time  1    Period  Months    Status  On-going    Target Date  06/11/19      PT SHORT TERM GOAL #2   Title  Patient tolerates standing at sink with right BKA kneeling in chair for 75 seconds with min guard.    Time  1    Period  Months    Status  On-going    Target Date  06/11/19      PT SHORT TERM GOAL #3   Title  Patient's wife is able to donne prosthesis correctly.    Time  1    Period  Months    Status  On-going    Target Date  06/11/19      PT  SHORT TERM GOAL #4   Title  Sit to/from stand w/c with  armrests to RW with modA.    Time  1    Period  Months    Status  On-going    Target Date  06/11/19        PT Long Term Goals - 05/18/19 1244      PT LONG TERM GOAL #1   Title  Patient and wife verbalize & demonstrate understanding of proper prosthetic care to enable safe utilization of prosthesis. (All LTGs Target Date: 11/03/2019)    Time  6    Period  Months    Status  On-going    Target Date  11/03/19      PT LONG TERM GOAL #2   Title  Patient tolerates wear of prosthesis >90% of awake hours without skin or residual limb pain issues to enable function during his day.    Time  6    Period  Months    Status  On-going    Target Date  11/03/19      PT LONG TERM GOAL #3   Title  Standing balance with RW support: maintains upright 2 minutes, reaches 5" anteriorly and scans environment with supervision.    Time  6    Period  Months    Status  On-going    Target Date  11/03/19      PT LONG TERM GOAL #4   Title  Patient ambulates 100' with RW & prosthesis with wife's assistance / supervision and turns 180* to position to sit safely.    Time  6    Period  Months    Status  On-going    Target Date  11/03/19      PT LONG TERM GOAL #5   Title  Sit to/from stand and stand-pivot transfers with RW & prosthesis with supervision.    Time  6    Period  Months    Status  On-going    Target Date  11/03/19            Plan - 05/21/19 1337    Clinical Impression Statement  PT is slowly progressing pre-prosthetic exercises & activity tolerance. Patient's heart rate is increasing less with activities now. He has an appointment tomorrow with [employer] and shoud begin prosthetic fitting.    Personal Factors and Comorbidities  Age;Comorbidity 3+;Fitness;Past/Current Experience;Time since onset of injury/illness/exacerbation    Comorbidities  Rt TTA, C2-5 Cervical Fusion 2014, Tachypnea, encephalopathy, RLE paralysis, TIA, A-Fib, CAD, CHF, CKD stage 3, Sz disorder, hx of vertigo, knee  arthroplasty Rt X 2 & Lt X 1    Examination-Activity Limitations  Caring for Others;Locomotion Level;Reach Overhead;Squat;Stairs;Stand;Transfers    Examination-Participation Restrictions  Community Activity    Stability/Clinical Decision Making  Evolving/Moderate complexity    Rehab Potential  Good    PT Frequency  2x / week    PT Duration  Other (comment)   25 weeks (6 months)   PT Treatment/Interventions  ADLs/Self Care Home Management;DME Instruction;Gait training;Stair training;Functional mobility training;Therapeutic activities;Therapeutic exercise;Balance training;Neuromuscular re-education;Patient/family education;Prosthetic Training    PT Next Visit Plan  work towards [place],  check HEP & progress, NuStep    Consulted and Agree with Plan of Care  Patient;Family member/caregiver    Family Member Consulted  wife, Jazmin Gradillas       Patient will benefit from skilled therapeutic intervention in order to improve the following deficits and impairments:  Abnormal gait, Cardiopulmonary status limiting activity, Decreased activity tolerance,  Decreased balance, Decreased endurance, Decreased knowledge of use of DME, Decreased mobility, Decreased range of motion, Decreased skin integrity, Decreased strength, Difficulty walking, Increased edema, Impaired flexibility, Postural dysfunction, Prosthetic Dependency, Pain  Visit Diagnosis: Other abnormalities of gait and mobility  Unsteadiness on feet  Abnormal posture  Stiffness of left knee, not elsewhere classified  Contracture of muscle, multiple sites  Chronic midline low back pain without sciatica  Cervicalgia  Muscle weakness (generalized)     Problem List Patient Active Problem List   Diagnosis Date Noted  . Dehiscence of amputation stump ([place])   . Wound infection 05/09/2018  . AKI (acute kidney injury) ([place])   . S/P BKA (below knee amputation) unilateral, right ([place])   . Below-knee amputation with complication, initial  encounter ([place])   . Tachypnea   . Post-operative pain   . Subacute osteomyelitis, right ankle and foot ([place])   . Paralysis of right lower extremity ([place])   . TIA (transient ischemic attack) 03/24/2018  . Encephalopathy 03/24/2018  . Cellulitis 03/15/2018  . Hypernatremia 03/15/2018  . Persistent atrial fibrillation ([place]) 11/10/2017  . Severe muscle deconditioning 11/10/2017  . Hemorrhage 10/22/2017  . CAD S/P percutaneous coronary angioplasty 10/22/2017  . Acute hypoxemic respiratory failure ([place]) 07/17/2017  . Aspiration syndrome, subsequent encounter 07/11/2017  . Aspiration pneumonia ([place]) 07/01/2017  . Acute encephalopathy 07/01/2017  . Thrombocytopenia ([place]) 07/01/2017  . Chronic diastolic (congestive) heart failure ([place]) 07/01/2017  . Anemia of chronic disease 07/01/2017  . Troponin level elevated 07/01/2017  . Chronic atrial fibrillation 07/01/2017  . Goals of care, counseling/discussion   . Palliative care by specialist   . Acute metabolic encephalopathy 123456  . CKD (chronic kidney disease) stage 3, GFR 30-59 ml/min   . CAP (community acquired pneumonia) 04/17/2017  . Hallux rigidus, right foot 03/10/2016  . Lumbosacral spondylosis without myelopathy 10/29/2015  . Memory difficulty 09/22/2015  . Abnormality of gait 09/22/2015  . Hyperglycemia 07/06/2015  . Chronic pain 07/06/2015  . Chronic insomnia 03/30/2015  . Transient alteration of awareness 03/30/2015  . Abnormal liver function   . Altered mental status 01/31/2015  . Essential hypertension 01/31/2015  . Constipation 01/31/2015  . Hypothyroidism 01/31/2015  . Seizure disorder ([place]) 01/31/2015  . Bladder outlet obstruction 01/31/2015  . GERD (gastroesophageal reflux disease) 01/31/2015  . Chronic back pain 01/31/2015  . Acute kidney injury ([place]) 01/31/2015    Jamey Reas PT, DPT 05/22/2019, 1:41 PM  [facility] Physical Therapy [address] [place], [place], [address] Phone:  [phone]   Fax:  [phone]  Name: [name] MRN: [id number] Date of Birth: 1933-08-20

## 2019-05-25 ENCOUNTER — Encounter: Payer: Self-pay | Admitting: Physical Therapy

## 2019-05-25 ENCOUNTER — Ambulatory Visit (INDEPENDENT_AMBULATORY_CARE_PROVIDER_SITE_OTHER): Payer: Medicare Other | Admitting: Physical Therapy

## 2019-05-25 ENCOUNTER — Other Ambulatory Visit: Payer: Self-pay

## 2019-05-25 ENCOUNTER — Telehealth: Payer: Self-pay | Admitting: Radiology

## 2019-05-25 DIAGNOSIS — R2681 Unsteadiness on feet: Secondary | ICD-10-CM

## 2019-05-25 DIAGNOSIS — M6249 Contracture of muscle, multiple sites: Secondary | ICD-10-CM

## 2019-05-25 DIAGNOSIS — R2689 Other abnormalities of gait and mobility: Secondary | ICD-10-CM

## 2019-05-25 DIAGNOSIS — M542 Cervicalgia: Secondary | ICD-10-CM

## 2019-05-25 DIAGNOSIS — M6281 Muscle weakness (generalized): Secondary | ICD-10-CM

## 2019-05-25 DIAGNOSIS — R293 Abnormal posture: Secondary | ICD-10-CM

## 2019-05-25 DIAGNOSIS — M25661 Stiffness of right knee, not elsewhere classified: Secondary | ICD-10-CM

## 2019-05-25 DIAGNOSIS — Z9181 History of falling: Secondary | ICD-10-CM

## 2019-05-25 NOTE — Telephone Encounter (Signed)
I called and sw pt and his wife to advise that the instruction that they had received from Shirlean Mylar was appropriate and would be what Dr. [name] would have advised. Encourage to follow these instructions to monitor that are and if she would like we would be happy to see him in the office at any time

## 2019-05-25 NOTE — Therapy (Signed)
[facility] [address] [place], [place], [address] Phone: [phone]   Fax:  [phone]  Physical Therapy Treatment  Patient Details  Name: [name] MRN: [id number] Date of Birth: 14-Dec-1933 Referring Provider (PT): Bevely Palmer Persons, [place]   Encounter Date: 05/25/2019  PT End of Session - 05/25/19 1252    Visit Number  5    Number of Visits  50    Date for PT Re-Evaluation  08/06/19    Authorization Type  BCBS & Medicare    PT Start Time  [id number]    PT Stop Time  1243    PT Time Calculation (min)  58 min    Equipment Utilized During Treatment  Gait belt    Activity Tolerance  Patient tolerated treatment well    Behavior During Therapy  [facility] for tasks assessed/performed       Past Medical History:  Diagnosis Date  . Abnormality of gait 09/22/2015  . Arthritis   . Atrial fibrillation ([place])   . CAD (coronary artery disease)    Stent to RCA, Penta stent, 99% reduced to 0% 2002.  . Cancer ([place])    skin CA removed from back  . Chronic insomnia 03/30/2015  . Complication of anesthesia    trouble waking up  . GERD (gastroesophageal reflux disease)   . Hypercholesteremia   . Hypertension   . Hypothyroidism   . Memory difficulty 09/22/2015  . Osteoarthritis   . Pneumonia   . Seizures ([place])   . Sepsis ([place]) 05/2017  . Transient alteration of awareness 03/30/2015  . Vertigo    hx of    Past Surgical History:  Procedure Laterality Date  . AMPUTATION Right 03/28/2018   Procedure: AMPUTATION BELOW KNEE;  Surgeon: Newt Minion, MD;  Location: [place];  Service: Orthopedics;  Laterality: Right;  . APPLICATION OF WOUND VAC Right 01/23/2019   Procedure: Application Of  Prevena Wound Vac;  Surgeon: Newt Minion, MD;  Location: [place];  Service: Orthopedics;  Laterality: Right;  . BACK SURGERY    . EYE SURGERY     Bilateral Cataract surgery   . HERNIA REPAIR    . I & D EXTREMITY Right 05/10/2015   Procedure: IRRIGATION AND DEBRIDEMENT  EXTREMITY;  Surgeon: Leanora Cover, MD;  Location: [place];  Service: Orthopedics;  Laterality: Right;  . KNEE ARTHROPLASTY     right knee X 2; left knee once  . LAMINECTOMY     X 6  . LEG AMPUTATION BELOW KNEE Right 03/28/2018  . POSTERIOR CERVICAL FUSION/FORAMINOTOMY  01/28/2012   Procedure: POSTERIOR CERVICAL FUSION/FORAMINOTOMY LEVEL 3;  Surgeon: [name], MD;  Location: [place] [facility];  Service: Neurosurgery;  Laterality: Left;  Posterior Cervical Five-Thoracic One Fusion, Arthrodesis with LEFT Cervical Seven-thoracic One Laminectomy, Foraminotomy and Resection of Synovial Cyst  . POSTERIOR CERVICAL FUSION/FORAMINOTOMY N/A 01/29/2013   Procedure: POSTERIOR CERVICAL FUSION/FORAMINOTOMY LEVEL 1 and C2-5 Posteriolateral Arthrodesis;  Surgeon: [name], MD;  Location: [place] [facility];  Service: Neurosurgery;  Laterality: N/A;  C2-C3 Laminectomy C2-C3 posterior cervical arthrodesis  . STUMP REVISION Right 01/23/2019   Procedure: REVISION RIGHT BELOW KNEE AMPUTATION;  Surgeon: Newt Minion, MD;  Location: [place];  Service: Orthopedics;  Laterality: Right;  . TONSILLECTOMY      There were no vitals filed for this visit.  Subjective Assessment - 05/25/19 1145    Subjective  He got prosthesis on Friday and has worn in sitting ~7min couple  of times. 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    Pertinent History  Rt TTA, C2-5 Cervical Fusion 2014, Tachypnea, encephalopathy, RLE paralysis, TIA, A-Fib, CAD, CHF, CKD stage 3, Sz disorder, hx of vertigo, knee arthroplasty Rt X 2 & Lt X 1    Limitations  Lifting;Standing;Walking;House hold activities    Patient Stated Goals  To get prosthesis, go to bathroom alone, get around house, get out in community.    Currently in Pain?  No/denies    Pain Onset  More than a month ago    Pain Onset  More than a month ago         [facility] PT Assessment - 05/25/19 1145      Assessment   Medical Diagnosis  Right Transtibial  Amputation    Referring Provider (PT)  Bevely Palmer Persons, PA      Static Standing Balance   Static Standing - Balance Support  Bilateral upper extremity supported   prosthesis on RLE   Static Standing - Level of Assistance  3: Mod assist   Once PT manually shifts pelvis over feet & orients prosthesi   Static Standing - Comment/# of Minutes  30seconds 1st stance & 45sec 2nd stance      Dynamic Standing Balance   Dynamic Standing - Balance Support  Left upper extremity supported   RW support with PT stabilizing RW   Dynamic Standing - Level of Assistance  3: Mod assist    Dynamic Standing - Balance Activities  Reaching for objects    Dynamic Standing - Comments  reaching for PTs hand at anterior RW (within reach of patient's arm) only able to release RW for 2 seconds to tap PTs hand.  PT had to fully stabilize RW.                    [facility] Treatment/Exercise - 05/25/19 1145      Transfers   Transfers  Sit to Stand;Stand to Sit    Sit to Stand  2: Max assist;With upper extremity assist;With armrests;From chair/3-in-1;Other (comment)   To RW & to //bars   Sit to Stand Details  Tactile cues for weight shifting;Visual cues for safe use of DME/AE;Verbal cues for technique;Verbal cues for sequencing;Verbal cues for safe use of DME/AE    Stand to Sit  4: Min assist;With upper extremity assist;With armrests;To chair/3-in-1;Other (comment)   from RW & from //bars   Stand to Sit Details (indicate cue type and reason)  Tactile cues for weight shifting;Visual cues for safe use of DME/AE;Verbal cues for technique;Verbal cues for sequencing;Verbal cues for safe use of DME/AE    Squat Pivot Transfers  --      Ambulation/Gait   Ambulation/Gait  Yes    Ambulation/Gait Assistance  2: Max assist    Ambulation/Gait Assistance Details  PT manual, demo, & verbal cues on prosthetic foot placement & orientation, wt shift over prosthesis & sequence.     Ambulation Distance (Feet)  5 Feet    5' X 2   Assistive device  Parallel bars;Prosthesis    Gait Pattern  Step-to pattern;Decreased step length - left;Decreased stance time - right;Decreased stride length;Decreased hip/knee flexion - right;Decreased hip/knee flexion - left;Decreased weight shift to right;Right hip hike;Right foot flat;Right flexed knee in stance;Left flexed knee in stance;Antalgic;Lateral hip instability;Trunk flexed;Poor foot clearance - right   right LE external rotation   Ambulation Surface  Indoor;Level      Neuro Re-ed  Neuro Re-ed Details   --      Knee/Hip Exercises: Aerobic   Nustep  --      Shoulder Exercises: Seated   Other Seated Exercises  --      Prosthetics   Prosthetic Care Comments   PT recommended wearing prosthesis 1hr 2x/day sitting only. If he needs to transfer remove the prosthesis for now.     Current prosthetic wear tolerance (days/week)   2x since delivery 4 days ago    Current prosthetic wear tolerance (#hours/day)   15 minutes    Current prosthetic weight-bearing tolerance (hours/day)   1 minute with no pain reported.     Residual limb condition   small opening on incision with scab present. After standing & gait, he has small amount of bloody drainage with no smell or cloudiness present.     Education Provided  Skin check;Residual limb care;Prosthetic cleaning;Correct ply sock adjustment;Proper Donning;Proper Doffing;Proper wear schedule/adjustment    Person(s) Educated  Patient;Spouse    Education Method  Explanation;Demonstration;Tactile cues;Verbal cues    Education Method  Verbalized understanding;Needs further instruction    Donning Prosthesis  Maximum assist    Doffing Prosthesis  Maximum assist               PT Short Term Goals - 05/18/19 1243      PT SHORT TERM GOAL #1   Title  Patient & wife demonstrate understanding of initial HEP. (All [facility] STGs: 06/11/2019)    Time  1    Period  Months    Status  On-going    Target Date  06/11/19      PT SHORT  TERM GOAL #2   Title  Patient tolerates standing at sink with right BKA kneeling in chair for 75 seconds with min guard.    Time  1    Period  Months    Status  On-going    Target Date  06/11/19      PT SHORT TERM GOAL #3   Title  Patient's wife is able to donne prosthesis correctly.    Time  1    Period  Months    Status  On-going    Target Date  06/11/19      PT SHORT TERM GOAL #4   Title  Sit to/from stand w/c with armrests to RW with modA.    Time  1    Period  Months    Status  On-going    Target Date  06/11/19        PT Long Term Goals - 05/18/19 1244      PT LONG TERM GOAL #1   Title  Patient and wife verbalize & demonstrate understanding of proper prosthetic care to enable safe utilization of prosthesis. (All LTGs [facility] Date: 11/03/2019)    Time  6    Period  Months    Status  On-going    Target Date  11/03/19      PT LONG TERM GOAL #2   Title  Patient tolerates wear of prosthesis >90% of awake hours without skin or residual limb pain issues to enable function during his day.    Time  6    Period  Months    Status  On-going    Target Date  11/03/19      PT LONG TERM GOAL #3   Title  Standing balance with RW support: maintains upright 2 minutes, reaches 5" anteriorly and scans environment with supervision.    Time  6    Period  Months    Status  On-going    Target Date  11/03/19      PT LONG TERM GOAL #4   Title  Patient ambulates 100' with RW & prosthesis with wife's assistance / supervision and turns 180* to position to sit safely.    Time  6    Period  Months    Status  On-going    Target Date  11/03/19      PT LONG TERM GOAL #5   Title  Sit to/from stand and stand-pivot transfers with RW & prosthesis with supervision.    Time  6    Period  Months    Status  On-going    Target Date  11/03/19            Plan - 05/25/19 1702    Clinical Impression Statement  Today was first PT session with prosthesis. PT educated patient & wife in donning,  residual limb care & wear daily 1hr 2x/day. He had small amount of bloody drainage with standing & gait. PT recommended no standing or gait outside PT due level of assistance and due to wound. Pt continues to have weakness & deconditioning significantly limiting all activities especially standing & gait.    Personal Factors and Comorbidities  Age;Comorbidity 3+;Fitness;Past/Current Experience;Time since onset of injury/illness/exacerbation    Comorbidities  Rt TTA, C2-5 Cervical Fusion 2014, Tachypnea, encephalopathy, RLE paralysis, TIA, A-Fib, CAD, CHF, CKD stage 3, Sz disorder, hx of vertigo, knee arthroplasty Rt X 2 & Lt X 1    Examination-Activity Limitations  Caring for Others;Locomotion Level;Reach Overhead;Squat;Stairs;Stand;Transfers    Examination-Participation Restrictions  Community Activity    Stability/Clinical Decision Making  Evolving/Moderate complexity    Rehab Potential  Good    PT Frequency  2x / week    PT Duration  Other (comment)   25 weeks (6 months)   PT Treatment/Interventions  ADLs/Self Care Home Management;DME Instruction;Gait training;Stair training;Functional mobility training;Therapeutic activities;Therapeutic exercise;Balance training;Neuromuscular re-education;Patient/family education;Prosthetic Training    PT Next Visit Plan  check residual limb, continue with prosthetic training, standing & gait in parallel bars.    Consulted and Agree with Plan of Care  Patient;Family member/caregiver    Family Member Consulted  wife, [name]       Patient will benefit from skilled therapeutic intervention in order to improve the following deficits and impairments:  Abnormal gait, Cardiopulmonary status limiting activity, Decreased activity tolerance, Decreased balance, Decreased endurance, Decreased knowledge of use of DME, Decreased mobility, Decreased range of motion, Decreased skin integrity, Decreased strength, Difficulty walking, Increased edema, Impaired flexibility,  Postural dysfunction, Prosthetic Dependency, Pain  Visit Diagnosis: Other abnormalities of gait and mobility  Unsteadiness on feet  Abnormal posture  Muscle weakness (generalized)  Contracture of muscle, multiple sites  Cervicalgia  Stiffness of right knee, not elsewhere classified  History of falling     Problem List Patient Active Problem List   Diagnosis Date Noted  . Dehiscence of amputation stump ([place])   . Wound infection 05/09/2018  . AKI (acute kidney injury) ([place])   . S/P BKA (below knee amputation) unilateral, right ([place])   . Below-knee amputation with complication, initial encounter ([place])   . Tachypnea   . Post-operative pain   . Subacute osteomyelitis, right ankle and foot ([place])   . Paralysis of right lower extremity ([place])   . TIA (transient ischemic attack) 03/24/2018  . Encephalopathy 03/24/2018  . Cellulitis 03/15/2018  . Hypernatremia 03/15/2018  .  Persistent atrial fibrillation ([place]) 11/10/2017  . Severe muscle deconditioning 11/10/2017  . Hemorrhage 10/22/2017  . CAD S/P percutaneous coronary angioplasty 10/22/2017  . Acute hypoxemic respiratory failure ([place]) 07/17/2017  . Aspiration syndrome, subsequent encounter 07/11/2017  . Aspiration pneumonia ([place]) 07/01/2017  . Acute encephalopathy 07/01/2017  . Thrombocytopenia ([place]) 07/01/2017  . Chronic diastolic (congestive) heart failure ([place]) 07/01/2017  . Anemia of chronic disease 07/01/2017  . Troponin level elevated 07/01/2017  . Chronic atrial fibrillation 07/01/2017  . Goals of care, counseling/discussion   . Palliative care by specialist   . Acute metabolic encephalopathy [id number]  . CKD (chronic kidney disease) stage 3, GFR 30-59 ml/min   . CAP (community acquired pneumonia) 04/17/2017  . Hallux rigidus, right foot 03/10/2016  . Lumbosacral spondylosis without myelopathy 10/29/2015  . Memory difficulty 09/22/2015  . Abnormality of gait 09/22/2015  . Hyperglycemia 07/06/2015  . Chronic  pain 07/06/2015  . Chronic insomnia 03/30/2015  . Transient alteration of awareness 03/30/2015  . Abnormal liver function   . Altered mental status 01/31/2015  . Essential hypertension 01/31/2015  . Constipation 01/31/2015  . Hypothyroidism 01/31/2015  . Seizure disorder ([place]) 01/31/2015  . Bladder outlet obstruction 01/31/2015  . GERD (gastroesophageal reflux disease) 01/31/2015  . Chronic back pain 01/31/2015  . Acute kidney injury ([place]) 01/31/2015    Jamey Reas PT, DPT 05/25/2019, 5:06 PM  [facility] [address] [place], [place], [address] Phone: [phone]   Fax:  [phone]  Name: [name] MRN: [id number] Date of Birth: 11-04-1933

## 2019-05-25 NOTE — Telephone Encounter (Signed)
Mrs. Andreoli called the triage line this afternoon, she states that she is concerned about a small area that did bleed on his incision today, she states that they were in PT with Shirlean Mylar today when they put on the prosethetic and Shirlean Mylar had him getting up and down with the parallel bars, then when they took it off there was a small are that was bleeding, Mrs. Perren states that Shirlean Mylar cleaned it with a damp cloth.  She states that per Shirlean Mylar he will sweat in the prosthetic and that they need to clean it through out the day, but Mrs. Pruette is concerned about him getting another infection in that leg.  She would like for someone to call her back to discuss this, she states that she plans to clean it with dial soap and water.  Her call back number is [phone]

## 2019-05-26 NOTE — Progress Notes (Signed)
   HPI: 86 y.o. male presenting today for complaint of constant soreness to the left third and fourth digits has been going on for the past year.  The patient is established and was last seen in the office on 01/28/2017.  He has a history of peripheral vascular disease and recurrent ulcers to the bilateral lower extremities.  Most recently he underwent below-knee amputation to the right lower extremity.  Currently there are no active wounds to his left lower extremity however he does complain of thickened dystrophic painful nails 1-5 the left foot.  He presents for further treatment and evaluation  Past Medical History:  Diagnosis Date  . Abnormality of gait 09/22/2015  . Arthritis   . Atrial fibrillation ([place])   . CAD (coronary artery disease)    Stent to RCA, Penta stent, 99% reduced to 0% 2002.  . Cancer ([place])    skin CA removed from back  . Chronic insomnia 03/30/2015  . Complication of anesthesia    trouble waking up  . GERD (gastroesophageal reflux disease)   . Hypercholesteremia   . Hypertension   . Hypothyroidism   . Memory difficulty 09/22/2015  . Osteoarthritis   . Pneumonia   . Seizures ([place])   . Sepsis ([place]) 05/2017  . Transient alteration of awareness 03/30/2015  . Vertigo    hx of     Physical Exam: General: The patient is alert and oriented x3 in no acute distress.  Dermatology: Skin is cool, dry and supple left lower extremity.  Negative for open lesions or macerations at the moment.  Vascular: Diminished pedal pulses left lower extremity. No edema or erythema noted. Capillary refill delayed left lower extremity.  Neurological: Epicritic and protective threshold diminished bilaterally.   Musculoskeletal Exam: Range of motion within normal limits to all pedal and ankle joints left. Muscle strength 5/5 in all groups left.  BKA noted right lower extremity  Radiographic Exam:  Normal osseous mineralization. Joint spaces preserved. No fracture/dislocation/boney  destruction.    Assessment: 1. H/o BKA RLE 2.  PVD LLE 3.  Dystrophic symptomatic nails 1-5 left 4. H/o multiple wounds bilateral lower extremities, recurrent   Plan of Care:  1. Patient evaluated. X-Rays reviewed.  2.  Mechanical debridement of nails 1-5 performed left foot without incident or bleeding 3.  Today we will request authorization for diabetic shoes and insoles.  The patient is not diabetic, but given the history of PVD, multiple recurrent ulcerations, preulcerative callus lesions, and eventual limb loss BKA to the right lower extremity, the patient needs diabetic shoes and insoles custom fitted.  This is definitely deemed medically necessary in order to salvage his left lower extremity and prevent ulceration 4.  Return to clinic annually      Edrick Kins, DPM [facility]  Dr. Edrick Kins, DPM    [address], [place] [address]                Office [phone]  Fax [phone]

## 2019-05-28 ENCOUNTER — Encounter: Payer: Medicare Other | Admitting: Physical Therapy

## 2019-05-29 ENCOUNTER — Encounter: Payer: Medicare Other | Admitting: Physical Therapy

## 2019-05-29 ENCOUNTER — Encounter: Payer: Medicare Other | Admitting: Rehabilitative and Restorative Service Providers"

## 2019-06-02 ENCOUNTER — Encounter: Payer: Medicare Other | Admitting: Physical Therapy

## 2019-06-04 ENCOUNTER — Encounter: Payer: Medicare Other | Admitting: Physical Therapy

## 2019-06-09 ENCOUNTER — Ambulatory Visit (INDEPENDENT_AMBULATORY_CARE_PROVIDER_SITE_OTHER): Payer: Medicare Other | Admitting: Physical Therapy

## 2019-06-09 ENCOUNTER — Other Ambulatory Visit: Payer: Self-pay

## 2019-06-09 ENCOUNTER — Encounter: Payer: Self-pay | Admitting: Physical Therapy

## 2019-06-09 DIAGNOSIS — M6249 Contracture of muscle, multiple sites: Secondary | ICD-10-CM

## 2019-06-09 DIAGNOSIS — R293 Abnormal posture: Secondary | ICD-10-CM

## 2019-06-09 DIAGNOSIS — R531 Weakness: Secondary | ICD-10-CM

## 2019-06-09 DIAGNOSIS — M25661 Stiffness of right knee, not elsewhere classified: Secondary | ICD-10-CM

## 2019-06-09 DIAGNOSIS — R2689 Other abnormalities of gait and mobility: Secondary | ICD-10-CM

## 2019-06-09 DIAGNOSIS — M542 Cervicalgia: Secondary | ICD-10-CM

## 2019-06-09 DIAGNOSIS — R2681 Unsteadiness on feet: Secondary | ICD-10-CM | POA: Diagnosis not present

## 2019-06-09 NOTE — Therapy (Signed)
[facility] [address] [place], [place], [address] Phone: [phone]   Fax:  [phone]  Physical Therapy Treatment  Patient Details  Name: [name] MRN: [id number] Date of Birth: February 25, 1934 Referring Provider (PT): Bevely Palmer Persons, [place]   Encounter Date: 06/09/2019  PT End of Session - 06/09/19 1321    Visit Number  6    Number of Visits  50    Date for PT Re-Evaluation  08/06/19    Authorization Type  BCBS & Medicare    PT Start Time  1147    PT Stop Time  1235    PT Time Calculation (min)  48 min    Equipment Utilized During Treatment  Gait belt    Activity Tolerance  Patient tolerated treatment well    Behavior During Therapy  [facility] for tasks assessed/performed       Past Medical History:  Diagnosis Date  . Abnormality of gait 09/22/2015  . Arthritis   . Atrial fibrillation ([place])   . CAD (coronary artery disease)    Stent to RCA, Penta stent, 99% reduced to 0% 2002.  . Cancer ([place])    skin CA removed from back  . Chronic insomnia 03/30/2015  . Complication of anesthesia    trouble waking up  . GERD (gastroesophageal reflux disease)   . Hypercholesteremia   . Hypertension   . Hypothyroidism   . Memory difficulty 09/22/2015  . Osteoarthritis   . Pneumonia   . Seizures ([place])   . Sepsis ([place]) 05/2017  . Transient alteration of awareness 03/30/2015  . Vertigo    hx of    Past Surgical History:  Procedure Laterality Date  . AMPUTATION Right 03/28/2018   Procedure: AMPUTATION BELOW KNEE;  Surgeon: Newt Minion, MD;  Location: [place];  Service: Orthopedics;  Laterality: Right;  . APPLICATION OF WOUND VAC Right 01/23/2019   Procedure: Application Of  Prevena Wound Vac;  Surgeon: Newt Minion, MD;  Location: [place];  Service: Orthopedics;  Laterality: Right;  . BACK SURGERY    . EYE SURGERY     Bilateral Cataract surgery   . HERNIA REPAIR    . I & D EXTREMITY Right 05/10/2015   Procedure: IRRIGATION AND DEBRIDEMENT  EXTREMITY;  Surgeon: Leanora Cover, MD;  Location: [place];  Service: Orthopedics;  Laterality: Right;  . KNEE ARTHROPLASTY     right knee X 2; left knee once  . LAMINECTOMY     X 6  . LEG AMPUTATION BELOW KNEE Right 03/28/2018  . POSTERIOR CERVICAL FUSION/FORAMINOTOMY  01/28/2012   Procedure: POSTERIOR CERVICAL FUSION/FORAMINOTOMY LEVEL 3;  Surgeon: [name], MD;  Location: [place] [facility];  Service: Neurosurgery;  Laterality: Left;  Posterior Cervical Five-Thoracic One Fusion, Arthrodesis with LEFT Cervical Seven-thoracic One Laminectomy, Foraminotomy and Resection of Synovial Cyst  . POSTERIOR CERVICAL FUSION/FORAMINOTOMY N/A 01/29/2013   Procedure: POSTERIOR CERVICAL FUSION/FORAMINOTOMY LEVEL 1 and C2-5 Posteriolateral Arthrodesis;  Surgeon: [name], MD;  Location: [place] [facility];  Service: Neurosurgery;  Laterality: N/A;  C2-C3 Laminectomy C2-C3 posterior cervical arthrodesis  . STUMP REVISION Right 01/23/2019   Procedure: REVISION RIGHT BELOW KNEE AMPUTATION;  Surgeon: Newt Minion, MD;  Location: [place];  Service: Orthopedics;  Laterality: Right;  . TONSILLECTOMY      There were no vitals filed for this visit.  Subjective Assessment - 06/09/19 1150    Subjective  He wore prosthesis daily ~1hr usually once a day but none last  week when feeling bad. They are trying to lower predisone & it made him feel bad.    Pertinent History  Rt TTA, C2-5 Cervical Fusion 2014, Tachypnea, encephalopathy, RLE paralysis, TIA, A-Fib, CAD, CHF, CKD stage 3, Sz disorder, hx of vertigo, knee arthroplasty Rt X 2 & Lt X 1    Limitations  Lifting;Standing;Walking;House hold activities    Patient Stated Goals  To get prosthesis, go to bathroom alone, get around house, get out in community.    Currently in Pain?  Yes    Pain Score  5     Pain Location  Other (Comment)   arthritic pain shoulder left side   Pain Descriptors / Indicators  Aching;Sore    Pain Type  Chronic pain     Pain Onset  More than a month ago    Pain Frequency  Constant    Aggravating Factors   arthritis    Pain Relieving Factors  medications    Pain Onset  More than a month ago                       [facility] Adult PT Treatment/Exercise - 06/09/19 1150      Transfers   Transfers  Sit to Stand;Stand to Sit    Sit to Stand  2: Max assist;With upper extremity assist;With armrests;From chair/3-in-1;Other (comment)   To RW   Sit to Stand Details  Tactile cues for weight shifting;Visual cues for safe use of DME/AE;Verbal cues for technique;Verbal cues for sequencing;Verbal cues for safe use of DME/AE    Stand to Sit  4: Min assist;With upper extremity assist;With armrests;To chair/3-in-1;Other (comment);3: Mod assist;Uncontrolled descent   from RW, 1st uncontrolled modA & 2nd minA   Stand to Sit Details (indicate cue type and reason)  Tactile cues for weight shifting;Visual cues for safe use of DME/AE;Verbal cues for technique;Verbal cues for sequencing;Verbal cues for safe use of DME/AE    Squat Pivot Transfers  4: Min assist;With upper extremity assistance   w/c to/from Nustep towards his left     Ambulation/Gait   Ambulation/Gait  Yes    Ambulation/Gait Assistance  2: Max assist   2 person assist   Ambulation/Gait Assistance Details  PT demo, verbal & manual cues on RW movement, sequence, wt shift & upright posture    Ambulation Distance (Feet)  5 Feet    Assistive device  Prosthesis;Rolling walker    Gait Pattern  Step-to pattern;Decreased step length - left;Decreased stance time - right;Decreased stride length;Decreased hip/knee flexion - right;Decreased hip/knee flexion - left;Decreased weight shift to right;Right hip hike;Right foot flat;Right flexed knee in stance;Left flexed knee in stance;Antalgic;Lateral hip instability;Trunk flexed;Poor foot clearance - right   right LE external rotation   Ambulation Surface  Indoor;Level      Neuro Re-ed    Neuro Re-ed Details    standing with RW support for 2 minutes. Scan right & left 1x ea with minimal motion /no wt shift. Reaching to PTs hand at front of RW with release <3seconds.  Reaching to waist of pants to adjust for 5 sec/side.  2 person assist for safety with Pt performing 50% of stabilization once PT positioned prosthesis      Knee/Hip Exercises: Aerobic   Nustep  Level 3 with BUEs & BLEs 1 minute 2 sets      Prosthetics   Prosthetic Care Comments   PT donned with too few, too many & correct ply socks with instructions (see pt  instructions) to pt & wife.  PT recommended wearing prosthesis 1hr 3x/day sitting only.  Sitting with prosthetic heel resting on floor or support.     Current prosthetic wear tolerance (days/week)   ~50% of days over last 2 weeks    Current prosthetic wear tolerance (#hours/day)   1 hour    Current prosthetic weight-bearing tolerance (hours/day)   1 minute with no pain reported.     Residual limb condition   small opening on incision with scab present.  No drainage today after standing / gait nor after NuStep.      Education Provided  Skin check;Residual limb care;Prosthetic cleaning;Correct ply sock adjustment;Proper Donning;Proper Doffing;Proper wear schedule/adjustment;Other (comment)   see prosthetic care comments   Person(s) Educated  Patient;Spouse    Education Method  Explanation;Demonstration;Tactile cues;Verbal cues;Handout    Education Method  Verbalized understanding;Needs further instruction    Donning Prosthesis  Maximum assist    Doffing Prosthesis  Moderate assist               PT Short Term Goals - 06/09/19 1325      PT SHORT TERM GOAL #1   Title  Patient & wife demonstrate understanding of initial HEP. (All [employer] STGs: 06/11/2019)    Baseline  MET 06/09/2019    Time  1    Period  Months    Status  Achieved    [facility] Date  06/11/19      PT SHORT TERM GOAL #2   Title  Patient tolerates standing at sink with right BKA kneeling in chair for 75 seconds with  min guard.    Time  1    Period  Months    Status  On-going    [facility] Date  06/11/19      PT SHORT TERM GOAL #3   Title  Patient's wife is able to donne prosthesis correctly.    Time  1    Period  Months    Status  On-going    Target Date  06/11/19      PT SHORT TERM GOAL #4   Title  Sit to/from stand w/c with armrests to RW with modA.    Time  1    Period  Months    Status  On-going    [facility] Date  06/11/19        PT Long Term Goals - 05/18/19 1244      PT LONG TERM GOAL #1   Title  Patient and wife verbalize & demonstrate understanding of proper prosthetic care to enable safe utilization of prosthesis. (All LTGs [facility] Date: 11/03/2019)    Time  6    Period  Months    Status  On-going    [facility] Date  11/03/19      PT LONG TERM GOAL #2   Title  Patient tolerates wear of prosthesis >90% of awake hours without skin or residual limb pain issues to enable function during his day.    Time  6    Period  Months    Status  On-going    Target Date  11/03/19      PT LONG TERM GOAL #3   Title  Standing balance with RW support: maintains upright 2 minutes, reaches 5" anteriorly and scans environment with supervision.    Time  6    Period  Months    Status  On-going    Target Date  11/03/19      PT LONG TERM GOAL #4  Title  Patient ambulates 100' with RW & prosthesis with wife's assistance / supervision and turns 180* to position to sit safely.    Time  6    Period  Months    Status  On-going    Target Date  11/03/19      PT LONG TERM GOAL #5   Title  Sit to/from stand and stand-pivot transfers with RW & prosthesis with supervision.    Time  6    Period  Months    Status  On-going    [facility] Date  11/03/19            Plan - 06/09/19 1326    Clinical Impression Statement  Patient missed PT for last 15 days due to not feeling well with medication changes. His wife reports wore liner for 1 hr ~50% of days but only once/day. PT session worked on prosthetic care  including donning, adjusting ply socks & wear schedule.  They appear to have better understanding but need additional instruction.  PT also worked on sit/stand, standing balance & prosthetic gait very limited. NuStep 1 min 2 sets for cardio & strength.    Personal Factors and Comorbidities  Age;Comorbidity 3+;Fitness;Past/Current Experience;Time since onset of injury/illness/exacerbation    Comorbidities  Rt TTA, C2-5 Cervical Fusion 2014, Tachypnea, encephalopathy, RLE paralysis, TIA, A-Fib, CAD, CHF, CKD stage 3, Sz disorder, hx of vertigo, knee arthroplasty Rt X 2 & Lt X 1    Examination-Activity Limitations  Caring for Others;Locomotion Level;Reach Overhead;Squat;Stairs;Stand;Transfers    Examination-Participation Restrictions  Community Activity    Stability/Clinical Decision Making  Evolving/Moderate complexity    Rehab Potential  Good    PT Frequency  2x / week    PT Duration  Other (comment)   25 weeks (6 months)   PT Treatment/Interventions  ADLs/Self Care Home Management;DME Instruction;Gait training;Stair training;Functional mobility training;Therapeutic activities;Therapeutic exercise;Balance training;Neuromuscular re-education;Patient/family education;Prosthetic Training    PT Next Visit Plan  check STGs, check residual limb, continue with prosthetic training, standing & gait in parallel bars.    Consulted and Agree with Plan of Care  Patient;Family member/caregiver    Family Member Consulted  wife, Chapin Arduini       Patient will benefit from skilled therapeutic intervention in order to improve the following deficits and impairments:  Abnormal gait, Cardiopulmonary status limiting activity, Decreased activity tolerance, Decreased balance, Decreased endurance, Decreased knowledge of use of DME, Decreased mobility, Decreased range of motion, Decreased skin integrity, Decreased strength, Difficulty walking, Increased edema, Impaired flexibility, Postural dysfunction, Prosthetic  Dependency, Pain  Visit Diagnosis: Other abnormalities of gait and mobility  Unsteadiness on feet  Abnormal posture  Weakness generalized  Contracture of muscle, multiple sites  Cervicalgia  Stiffness of right knee, not elsewhere classified     Problem List Patient Active Problem List   Diagnosis Date Noted  . Dehiscence of amputation stump ([place])   . Wound infection 05/09/2018  . AKI (acute kidney injury) ([place])   . S/P BKA (below knee amputation) unilateral, right ([place])   . Below-knee amputation with complication, initial encounter ([place])   . Tachypnea   . Post-operative pain   . Subacute osteomyelitis, right ankle and foot ([place])   . Paralysis of right lower extremity ([place])   . TIA (transient ischemic attack) 03/24/2018  . Encephalopathy 03/24/2018  . Cellulitis 03/15/2018  . Hypernatremia 03/15/2018  . Persistent atrial fibrillation ([place]) 11/10/2017  . Severe muscle deconditioning 11/10/2017  . Hemorrhage 10/22/2017  . CAD S/P percutaneous coronary angioplasty  10/22/2017  . Acute hypoxemic respiratory failure ([place]) 07/17/2017  . Aspiration syndrome, subsequent encounter 07/11/2017  . Aspiration pneumonia ([place]) 07/01/2017  . Acute encephalopathy 07/01/2017  . Thrombocytopenia ([place]) 07/01/2017  . Chronic diastolic (congestive) heart failure ([place]) 07/01/2017  . Anemia of chronic disease 07/01/2017  . Troponin level elevated 07/01/2017  . Chronic atrial fibrillation 07/01/2017  . Goals of care, counseling/discussion   . Palliative care by specialist   . Acute metabolic encephalopathy 26/41/5830  . CKD (chronic kidney disease) stage 3, GFR 30-59 ml/min   . CAP (community acquired pneumonia) 04/17/2017  . Hallux rigidus, right foot 03/10/2016  . Lumbosacral spondylosis without myelopathy 10/29/2015  . Memory difficulty 09/22/2015  . Abnormality of gait 09/22/2015  . Hyperglycemia 07/06/2015  . Chronic pain 07/06/2015  . Chronic insomnia 03/30/2015  . Transient  alteration of awareness 03/30/2015  . Abnormal liver function   . Altered mental status 01/31/2015  . Essential hypertension 01/31/2015  . Constipation 01/31/2015  . Hypothyroidism 01/31/2015  . Seizure disorder ([place]) 01/31/2015  . Bladder outlet obstruction 01/31/2015  . GERD (gastroesophageal reflux disease) 01/31/2015  . Chronic back pain 01/31/2015  . Acute kidney injury ([place]) 01/31/2015    Jamey Reas PT, DPT 06/09/2019, 1:36 PM  [facility] Physical Therapy [address] [place], [place], [address] Phone: [phone]   Fax:  [phone]  Name: [name] MRN: [id number] Date of Birth: 06-21-1933

## 2019-06-09 NOTE — Patient Instructions (Signed)
[employer] Socks:  1-ply is thin no color at top,  3-ply is yellow at top,  5-ply is green at top How many ply you need depends on your limb size.  You should have even pressure on your limb when standing & walking.  Guidance points: 1. How ease it goes on? Should be some resistance. Too few it goes on too easily. Too many it takes a lot of work to get it on. 2. How many clicks you get. Especially clicks in sitting. 3. After standing or walking, check knee cap. Bottom should be just under the front lip.  Too few bottom of knee cap sits on indention. Too many bottom is above front lip. 4. Have your feet beside each other & hips over feet. Place hands on your waist. Pelvis Should be level. Too few prosthetic side will be low. Too many prosthetic side will be high.    Get ply socks correct before you leave the house. Take extra socks with you. Take one 3-ply and two 1-ply with you. This is in addition to what you are wearing.   

## 2019-06-11 ENCOUNTER — Other Ambulatory Visit: Payer: Self-pay

## 2019-06-11 ENCOUNTER — Other Ambulatory Visit: Payer: Self-pay | Admitting: Cardiovascular Disease

## 2019-06-11 ENCOUNTER — Ambulatory Visit (INDEPENDENT_AMBULATORY_CARE_PROVIDER_SITE_OTHER): Payer: Medicare Other | Admitting: Physical Therapy

## 2019-06-11 ENCOUNTER — Encounter: Payer: Self-pay | Admitting: Physical Therapy

## 2019-06-11 DIAGNOSIS — M25661 Stiffness of right knee, not elsewhere classified: Secondary | ICD-10-CM

## 2019-06-11 DIAGNOSIS — R2689 Other abnormalities of gait and mobility: Secondary | ICD-10-CM

## 2019-06-11 DIAGNOSIS — R531 Weakness: Secondary | ICD-10-CM

## 2019-06-11 DIAGNOSIS — M6281 Muscle weakness (generalized): Secondary | ICD-10-CM

## 2019-06-11 DIAGNOSIS — R293 Abnormal posture: Secondary | ICD-10-CM | POA: Diagnosis not present

## 2019-06-11 DIAGNOSIS — R2681 Unsteadiness on feet: Secondary | ICD-10-CM

## 2019-06-11 DIAGNOSIS — Z9181 History of falling: Secondary | ICD-10-CM

## 2019-06-12 NOTE — Therapy (Signed)
[facility] Physical Therapy [address] [place], [place], [address] Phone: [phone]   Fax:  [phone]  Physical Therapy Treatment  Patient Details  Name: [name] MRN: [id number] Date of Birth: February 13, 1934 Referring Provider (PT): Bevely Palmer Persons, [place]   Encounter Date: 06/11/2019  PT End of Session - 06/11/19 1550    Visit Number  7    Number of Visits  50    Date for PT Re-Evaluation  08/06/19    Authorization Type  BCBS & Medicare    PT Start Time  1316    PT Stop Time  1400    PT Time Calculation (min)  44 min    Equipment Utilized During Treatment  Gait belt    Activity Tolerance  Patient tolerated treatment well    Behavior During Therapy  [facility] for tasks assessed/performed       Past Medical History:  Diagnosis Date  . Abnormality of gait 09/22/2015  . Arthritis   . Atrial fibrillation ([place])   . CAD (coronary artery disease)    Stent to RCA, Penta stent, 99% reduced to 0% 2002.  . Cancer ([place])    skin CA removed from back  . Chronic insomnia 03/30/2015  . Complication of anesthesia    trouble waking up  . GERD (gastroesophageal reflux disease)   . Hypercholesteremia   . Hypertension   . Hypothyroidism   . Memory difficulty 09/22/2015  . Osteoarthritis   . Pneumonia   . Seizures ([place])   . Sepsis ([place]) 05/2017  . Transient alteration of awareness 03/30/2015  . Vertigo    hx of    Past Surgical History:  Procedure Laterality Date  . AMPUTATION Right 03/28/2018   Procedure: AMPUTATION BELOW KNEE;  Surgeon: Newt Minion, MD;  Location: [place];  Service: Orthopedics;  Laterality: Right;  . APPLICATION OF WOUND VAC Right 01/23/2019   Procedure: Application Of  Prevena Wound Vac;  Surgeon: Newt Minion, MD;  Location: [place];  Service: Orthopedics;  Laterality: Right;  . BACK SURGERY    . EYE SURGERY     Bilateral Cataract surgery   . HERNIA REPAIR    . I & D EXTREMITY Right 05/10/2015   Procedure: IRRIGATION AND DEBRIDEMENT  EXTREMITY;  Surgeon: Leanora Cover, MD;  Location: [place];  Service: Orthopedics;  Laterality: Right;  . KNEE ARTHROPLASTY     right knee X 2; left knee once  . LAMINECTOMY     X 6  . LEG AMPUTATION BELOW KNEE Right 03/28/2018  . POSTERIOR CERVICAL FUSION/FORAMINOTOMY  01/28/2012   Procedure: POSTERIOR CERVICAL FUSION/FORAMINOTOMY LEVEL 3;  Surgeon: [name], MD;  Location: [place] [facility]UR[facility]S;  Service: Neurosurgery;  Laterality: Left;  Posterior Cervical Five-Thoracic One Fusion, Arthrodesis with LEFT Cervical Seven-thoracic One Laminectomy, Foraminotomy and Resection of Synovial Cyst  . POSTERIOR CERVICAL FUSION/FORAMINOTOMY N/A 01/29/2013   Procedure: POSTERIOR CERVICAL FUSION/FORAMINOTOMY LEVEL 1 and C2-5 Posteriolateral Arthrodesis;  Surgeon: [name], MD;  Location: [place] [facility];  Service: Neurosurgery;  Laterality: N/A;  C2-C3 Laminectomy C2-C3 posterior cervical arthrodesis  . STUMP REVISION Right 01/23/2019   Procedure: REVISION RIGHT BELOW KNEE AMPUTATION;  Surgeon: Newt Minion, MD;  Location: [place];  Service: Orthopedics;  Laterality: Right;  . TONSILLECTOMY      There were no vitals filed for this visit.  Subjective Assessment - 06/11/19 1315    Subjective  He wore prosthesis for 1 hour 2x yesterday.    Pertinent  History  Rt TTA, C2-5 Cervical Fusion 2014, Tachypnea, encephalopathy, RLE paralysis, TIA, A-Fib, CAD, CHF, CKD stage 3, Sz disorder, hx of vertigo, knee arthroplasty Rt X 2 & Lt X 1    Limitations  Lifting;Standing;Walking;House hold activities    Patient Stated Goals  To get prosthesis, go to bathroom alone, get around house, get out in community.    Currently in Pain?  Yes    Pain Score  7     Pain Location  Neck    Pain Descriptors / Indicators  Aching;Sore    Pain Type  Chronic pain    Pain Onset  More than a month ago    Pain Onset  More than a month ago                       [facility] Adult PT Treatment/Exercise -  06/11/19 1315      Transfers   Transfers  Sit to Stand;Stand to Sit    Sit to Stand  3: Mod assist;With upper extremity assist;With armrests;From chair/3-in-1;Other (comment)   to sink    Sit to Stand Details  Tactile cues for weight shifting;Visual cues for safe use of DME/AE;Verbal cues for technique;Verbal cues for sequencing;Verbal cues for safe use of DME/AE    Stand to Sit  4: Min assist;With upper extremity assist;With armrests;To chair/3-in-1;Other (comment)   from sink to w/c   Stand to Sit Details (indicate cue type and reason)  Tactile cues for weight shifting;Visual cues for safe use of DME/AE;Verbal cues for technique;Verbal cues for sequencing;Verbal cues for safe use of DME/AE    Squat Pivot Transfers  4: Min assist;With upper extremity assistance   w/c to/from Nustep to left & right,    Squat Pivot Transfer Details (indicate cue type and reason)  Manual & verbal cues on trunk forward lean to wt shift over feet and lifting buttocks prior to rotating      Ambulation/Gait   Ambulation/Gait  --    Ambulation/Gait Assistance  --    Ambulation Distance (Feet)  --    Assistive device  --    Gait Pattern  --      Neuro Re-ed    Neuro Re-ed Details   standing at sink for 2 minutes X 2 with tactile & verbal cues for wt shift right/left midline and ant/post midline.        Knee/Hip Exercises: Aerobic   Nustep  Level 3 with BUEs & BLEs 3 minutes 2 sets  HR 77 SpO2 99% 2nd set 100% & 69 HR      Prosthetics   Prosthetic Care Comments   Increase wear to 2hrs 2x/day sitting only.   NO drainage at end of session.      Current prosthetic wear tolerance (days/week)   daily     Current prosthetic wear tolerance (#hours/day)   1 hour 2x/day    Current prosthetic weight-bearing tolerance (hours/day)   2 minutes with no pain reported.     Edema  non-pitting edema     Residual limb condition   small opening on incision with scab present.  No drainage today after standing / gait nor after  NuStep.      Education Provided  Skin check;Residual limb care;Prosthetic cleaning;Correct ply sock adjustment;Proper Donning;Proper Doffing;Proper wear schedule/adjustment;Other (comment)   see prosthetic care comments   Person(s) Educated  Patient;Spouse    Education Method  Explanation;Demonstration;Tactile cues;Verbal cues    Education Method  Verbalized understanding;Verbal cues  required;Needs further instruction    Donning Prosthesis  Maximum assist   wife able to donne    Doffing Prosthesis  Moderate assist               PT Short Term Goals - 06/11/19 1936      PT SHORT TERM GOAL #1   Title  Patient & wife demonstrate understanding of initial HEP. (All Target STGs: 06/11/2019)    Baseline  MET 06/09/2019    Time  1    Period  Months    Status  Achieved    Target Date  06/11/19      PT SHORT TERM GOAL #2   Title  Patient tolerates standing at sink with right BKA kneeling in chair for 75 seconds with min guard.    Baseline  MET 06/11/2019    Time  1    Period  Months    Status  Achieved    Target Date  06/11/19      PT SHORT TERM GOAL #3   Title  Patient's wife is able to donne prosthesis correctly.    Baseline  MET 06/11/2019    Time  1    Period  Months    Status  Achieved    Target Date  06/11/19      PT SHORT TERM GOAL #4   Title  Sit to/from stand w/c with armrests to RW with modA.    Baseline  partially MET 06/11/2019 to sink    Time  1    Period  Months    Status  Partially Met    Target Date  06/11/19        PT Short Term Goals - 06/11/19 1942      PT SHORT TERM GOAL #1   Title  Patient & wife demonstrate understanding of updated HEP. (All Target STGs: 07/09/2019)    Time  1    Period  Months    Status  Revised    Target Date  07/09/19      PT SHORT TERM GOAL #2   Title  Sit to/from stand at sink with minA / wife able to assist and stand for 3 minutes with min guard.    Time  1    Period  Months    Status  Revised    Target Date  07/09/19       PT SHORT TERM GOAL #3   Title  Wife & patient report adjusting ply socks with limb volume changes.    Time  1    Period  Months    Status  Revised    Target Date  07/09/19      PT SHORT TERM GOAL #4   Title  Patient tolerates prosthesis wear >6 hours total /day without increase in wound issues.    Time  1    Period  Months    Status  New    Target Date  07/09/19      PT SHORT TERM GOAL #5   Title  Patient ambulates 10' in parallel bars with modA.    Time  1    Period  Months    Status  New    Target Date  07/09/19         PT Long Term Goals - 05/18/19 1244      PT LONG TERM GOAL #1   Title  Patient and wife verbalize & demonstrate understanding of proper prosthetic care to enable safe utilization of  prosthesis. (All LTGs Target Date: 11/03/2019)    Time  6    Period  Months    Status  On-going    Target Date  11/03/19      PT LONG TERM GOAL #2   Title  Patient tolerates wear of prosthesis >90% of awake hours without skin or residual limb pain issues to enable function during his day.    Time  6    Period  Months    Status  On-going    Target Date  11/03/19      PT LONG TERM GOAL #3   Title  Standing balance with RW support: maintains upright 2 minutes, reaches 5" anteriorly and scans environment with supervision.    Time  6    Period  Months    Status  On-going    Target Date  11/03/19      PT LONG TERM GOAL #4   Title  Patient ambulates 100' with RW & prosthesis with wife's assistance / supervision and turns 180* to position to sit safely.    Time  6    Period  Months    Status  On-going    Target Date  11/03/19      PT LONG TERM GOAL #5   Title  Sit to/from stand and stand-pivot transfers with RW & prosthesis with supervision.    Time  6    Period  Months    Status  On-going    Target Date  11/03/19            Plan - 06/11/19 1937    Clinical Impression Statement  Patient met all STGs. PT session working on endurance / strength with NuStep  with increased time today. PT reviewed prosthetic care and increased wear time to 2hrs 2x/day. His wound on limb is not increasing & no drainage with prosthesis wear & PT activities.    Personal Factors and Comorbidities  Age;Comorbidity 3+;Fitness;Past/Current Experience;Time since onset of injury/illness/exacerbation    Comorbidities  Rt TTA, C2-5 Cervical Fusion 2014, Tachypnea, encephalopathy, RLE paralysis, TIA, A-Fib, CAD, CHF, CKD stage 3, Sz disorder, hx of vertigo, knee arthroplasty Rt X 2 & Lt X 1    Examination-Activity Limitations  Caring for Others;Locomotion Level;Reach Overhead;Squat;Stairs;Stand;Transfers    Examination-Participation Restrictions  Community Activity    Stability/Clinical Decision Making  Evolving/Moderate complexity    Rehab Potential  Good    PT Frequency  2x / week    PT Duration  Other (comment)   25 weeks (6 months)   PT Treatment/Interventions  ADLs/Self Care Home Management;DME Instruction;Gait training;Stair training;Functional mobility training;Therapeutic activities;Therapeutic exercise;Balance training;Neuromuscular re-education;Patient/family education;Prosthetic Training    PT Next Visit Plan  work towards updated STGs, check residual limb, continue with prosthetic training, standing & gait in parallel bars.    Consulted and Agree with Plan of Care  Patient;Family member/caregiver    Family Member Consulted  wife, Antar Milks       Patient will benefit from skilled therapeutic intervention in order to improve the following deficits and impairments:  Abnormal gait, Cardiopulmonary status limiting activity, Decreased activity tolerance, Decreased balance, Decreased endurance, Decreased knowledge of use of DME, Decreased mobility, Decreased range of motion, Decreased skin integrity, Decreased strength, Difficulty walking, Increased edema, Impaired flexibility, Postural dysfunction, Prosthetic Dependency, Pain  Visit Diagnosis: Other abnormalities of  gait and mobility  Unsteadiness on feet  Abnormal posture  Weakness generalized  History of fall  Stiffness of right knee, not elsewhere classified  Muscle  weakness (generalized)  History of falling     Problem List Patient Active Problem List   Diagnosis Date Noted  . Dehiscence of amputation stump ([place])   . Wound infection 05/09/2018  . AKI (acute kidney injury) ([place])   . S/P BKA (below knee amputation) unilateral, right ([place])   . Below-knee amputation with complication, initial encounter ([place])   . Tachypnea   . Post-operative pain   . Subacute osteomyelitis, right ankle and foot ([place])   . Paralysis of right lower extremity ([place])   . TIA (transient ischemic attack) 03/24/2018  . Encephalopathy 03/24/2018  . Cellulitis 03/15/2018  . Hypernatremia 03/15/2018  . Persistent atrial fibrillation ([place]) 11/10/2017  . Severe muscle deconditioning 11/10/2017  . Hemorrhage 10/22/2017  . CAD S/P percutaneous coronary angioplasty 10/22/2017  . Acute hypoxemic respiratory failure ([place]) 07/17/2017  . Aspiration syndrome, subsequent encounter 07/11/2017  . Aspiration pneumonia ([place]) 07/01/2017  . Acute encephalopathy 07/01/2017  . Thrombocytopenia ([place]) 07/01/2017  . Chronic diastolic (congestive) heart failure ([place]) 07/01/2017  . Anemia of chronic disease 07/01/2017  . Troponin level elevated 07/01/2017  . Chronic atrial fibrillation 07/01/2017  . Goals of care, counseling/discussion   . Palliative care by specialist   . Acute metabolic encephalopathy 53/20/2334  . CKD (chronic kidney disease) stage 3, GFR 30-59 ml/min   . CAP (community acquired pneumonia) 04/17/2017  . Hallux rigidus, right foot 03/10/2016  . Lumbosacral spondylosis without myelopathy 10/29/2015  . Memory difficulty 09/22/2015  . Abnormality of gait 09/22/2015  . Hyperglycemia 07/06/2015  . Chronic pain 07/06/2015  . Chronic insomnia 03/30/2015  . Transient alteration of awareness 03/30/2015  .  Abnormal liver function   . Altered mental status 01/31/2015  . Essential hypertension 01/31/2015  . Constipation 01/31/2015  . Hypothyroidism 01/31/2015  . Seizure disorder ([place]) 01/31/2015  . Bladder outlet obstruction 01/31/2015  . GERD (gastroesophageal reflux disease) 01/31/2015  . Chronic back pain 01/31/2015  . Acute kidney injury ([place]) 01/31/2015    Jamey Reas PT, DPT 06/12/2019, 11:42 AM  [facility] Physical Therapy [address] [place], [place], [address] Phone: [phone]   Fax:  [phone]  Name: [name] MRN: [id number] Date of Birth: October 17, 1933

## 2019-06-15 ENCOUNTER — Other Ambulatory Visit: Payer: Self-pay

## 2019-06-15 ENCOUNTER — Ambulatory Visit (INDEPENDENT_AMBULATORY_CARE_PROVIDER_SITE_OTHER): Payer: Medicare Other | Admitting: Physical Therapy

## 2019-06-15 DIAGNOSIS — R2689 Other abnormalities of gait and mobility: Secondary | ICD-10-CM

## 2019-06-15 DIAGNOSIS — Z9181 History of falling: Secondary | ICD-10-CM

## 2019-06-15 DIAGNOSIS — M542 Cervicalgia: Secondary | ICD-10-CM

## 2019-06-15 DIAGNOSIS — M6249 Contracture of muscle, multiple sites: Secondary | ICD-10-CM

## 2019-06-15 DIAGNOSIS — R293 Abnormal posture: Secondary | ICD-10-CM

## 2019-06-15 DIAGNOSIS — M6281 Muscle weakness (generalized): Secondary | ICD-10-CM

## 2019-06-15 DIAGNOSIS — R2681 Unsteadiness on feet: Secondary | ICD-10-CM

## 2019-06-15 DIAGNOSIS — M25662 Stiffness of left knee, not elsewhere classified: Secondary | ICD-10-CM

## 2019-06-15 DIAGNOSIS — M25661 Stiffness of right knee, not elsewhere classified: Secondary | ICD-10-CM

## 2019-06-15 NOTE — Therapy (Signed)
[place], [place], [address] Phone: [phone]   Fax:  [phone]  Physical Therapy Treatment  Patient Details  Name: [name] MRN: [id number] Date of Birth: Jul 28, 1933 Referring Provider (PT): Bevely Palmer Persons, [place]   Encounter Date: 06/15/2019  PT End of Session - 06/15/19 1315    Visit Number  8    Number of Visits  50    Date for PT Re-Evaluation  08/06/19    Authorization Type  BCBS & Medicare    PT Start Time  1100    PT Stop Time  1145    PT Time Calculation (min)  45 min    Equipment Utilized During Treatment  Gait belt    Activity Tolerance  Patient tolerated treatment well    Behavior During Therapy  [facility] for tasks assessed/performed       Past Medical History:  Diagnosis Date  . Abnormality of gait 09/22/2015  . Arthritis   . Atrial fibrillation ([place])   . CAD (coronary artery disease)    Stent to RCA, Penta stent, 99% reduced to 0% 2002.  . Cancer ([place])    skin CA removed from back  . Chronic insomnia 03/30/2015  . Complication of anesthesia    trouble waking up  . GERD (gastroesophageal reflux disease)   . Hypercholesteremia   . Hypertension   . Hypothyroidism   . Memory difficulty 09/22/2015  . Osteoarthritis   . Pneumonia   . Seizures ([place])   . Sepsis ([place]) 05/2017  . Transient alteration of awareness 03/30/2015  . Vertigo    hx of    Past Surgical History:  Procedure Laterality Date  . AMPUTATION Right 03/28/2018   Procedure: AMPUTATION BELOW KNEE;  Surgeon: Newt Minion, MD;  Location: [place];  Service: Orthopedics;  Laterality: Right;  . APPLICATION OF WOUND VAC Right 01/23/2019   Procedure: Application Of  Prevena Wound Vac;  Surgeon: Newt Minion, MD;  Location: [facility];  Service: Orthopedics;  Laterality: Right;  . BACK SURGERY    . EYE SURGERY     Bilateral Cataract surgery   . HERNIA REPAIR    . I & D EXTREMITY Right 05/10/2015   Procedure: IRRIGATION AND DEBRIDEMENT  EXTREMITY;  Surgeon: Leanora Cover, MD;  Location: [facility];  Service: Orthopedics;  Laterality: Right;  . KNEE ARTHROPLASTY     right knee X 2; left knee once  . LAMINECTOMY     X 6  . LEG AMPUTATION BELOW KNEE Right 03/28/2018  . POSTERIOR CERVICAL FUSION/FORAMINOTOMY  01/28/2012   Procedure: POSTERIOR CERVICAL FUSION/FORAMINOTOMY LEVEL 3;  Surgeon: [name], MD;  Location: [facility];  Service: Neurosurgery;  Laterality: Left;  Posterior Cervical Five-Thoracic One Fusion, Arthrodesis with LEFT Cervical Seven-thoracic One Laminectomy, Foraminotomy and Resection of Synovial Cyst  . POSTERIOR CERVICAL FUSION/FORAMINOTOMY N/A 01/29/2013   Procedure: POSTERIOR CERVICAL FUSION/FORAMINOTOMY LEVEL 1 and C2-5 Posteriolateral Arthrodesis;  Surgeon: [name], MD;  Location: [facility];  Service: Neurosurgery;  Laterality: N/A;  C2-C3 Laminectomy C2-C3 posterior cervical arthrodesis  . STUMP REVISION Right 01/23/2019   Procedure: REVISION RIGHT BELOW KNEE AMPUTATION;  Surgeon: Newt Minion, MD;  Location: [facility];  Service: Orthopedics;  Laterality: Right;  . TONSILLECTOMY      There were no vitals filed for this visit.                    [place] Adult PT Treatment/Exercise -  06/15/19 1100      Transfers   Transfers  Sit to Stand;Stand to Sit;Anterior-Posterior Transfer    Sit to Stand  3: Mod assist;With upper extremity assist;With armrests;From chair/3-in-1;Other (comment)   to sink    Sit to Stand Details  Tactile cues for weight shifting;Visual cues for safe use of DME/AE;Verbal cues for technique;Verbal cues for sequencing;Verbal cues for safe use of DME/AE    Stand to Sit  4: Min assist;With upper extremity assist;With armrests;To chair/3-in-1;Other (comment)   from sink to w/c   Stand to Sit Details (indicate cue type and reason)  Tactile cues for weight shifting;Visual cues for safe use of DME/AE;Verbal cues for technique;Verbal cues for  sequencing;Verbal cues for safe use of DME/AE    Squat Pivot Transfers  --    Anterior-Posterior Transfer  4: Min assist   on/off toilet facing backwards   Anterior-Posterior Transfer Details (indicate cue type and reason)  verbal & tactile cues on technique      Self-Care   Self-Care  ADL's    ADL's  PT educated on using urinal seated in w/c with prosthesis and urinating in toilet seated in w/c.  Transfering to toilet scooting forward on to toilet and scooting back into w/c.  Pulling pants down around left ankle prior to transferring.  Wife & pt verbalized that this can help with toileting at home & if needed in community.              Neuro Re-ed    Neuro Re-ed Details   --      Knee/Hip Exercises: Aerobic   Nustep  --      Prosthetics   Prosthetic Care Comments   Need to increase total time by 3 reps / day without increasing risk of skin issues.  Positioning with hip/knee/foot in line (not external rotation)  probably where he got bruise on interior knee.      Current prosthetic wear tolerance (days/week)   daily     Current prosthetic wear tolerance (#hours/day)   3hrs,  PT recommended 2 hrs 3x/day    Current prosthetic weight-bearing tolerance (hours/day)   2 minutes with no pain reported.     Edema  non-pitting edema     Residual limb condition   small opening on incision with scab present.  No drainage today after standing / gait nor after NuStep.      Education Provided  Skin check;Residual limb care;Prosthetic cleaning;Correct ply sock adjustment;Proper Donning;Proper Doffing;Proper wear schedule/adjustment;Other (comment)   see prosthetic care comments   Person(s) Educated  Patient;Spouse    Education Method  Explanation;Demonstration;Tactile cues;Verbal cues    Education Method  Verbalized understanding;Verbal cues required;Needs further instruction    Donning Prosthesis  Moderate assist    Doffing Prosthesis  Moderate assist               PT Short Term Goals -  06/11/19 1942      PT SHORT TERM GOAL #1   Title  Patient & wife demonstrate understanding of updated HEP. (All [facility] STGs: 07/09/2019)    Time  1    Period  Months    Status  Revised    Target Date  07/09/19      PT SHORT TERM GOAL #2   Title  Sit to/from stand at sink with minA / wife able to assist and stand for 3 minutes with min guard.    Time  1    Period  Months  Status  Revised    Target Date  07/09/19      PT SHORT TERM GOAL #3   Title  Wife & patient report adjusting ply socks with limb volume changes.    Time  1    Period  Months    Status  Revised    Target Date  07/09/19      PT SHORT TERM GOAL #4   Title  Patient tolerates prosthesis wear >6 hours total /day without increase in wound issues.    Time  1    Period  Months    Status  New    Target Date  07/09/19      PT SHORT TERM GOAL #5   Title  Patient ambulates 10' in parallel bars with modA.    Time  1    Period  Months    Status  New    Target Date  07/09/19        PT Long Term Goals - 05/18/19 1244      PT LONG TERM GOAL #1   Title  Patient and wife verbalize & demonstrate understanding of proper prosthetic care to enable safe utilization of prosthesis. (All LTGs [facility] Date: 11/03/2019)    Time  6    Period  Months    Status  On-going    Target Date  11/03/19      PT LONG TERM GOAL #2   Title  Patient tolerates wear of prosthesis >90% of awake hours without skin or residual limb pain issues to enable function during his day.    Time  6    Period  Months    Status  On-going    Target Date  11/03/19      PT LONG TERM GOAL #3   Title  Standing balance with RW support: maintains upright 2 minutes, reaches 5" anteriorly and scans environment with supervision.    Time  6    Period  Months    Status  On-going    Target Date  11/03/19      PT LONG TERM GOAL #4   Title  Patient ambulates 100' with RW & prosthesis with wife's assistance / supervision and turns 180* to position to sit safely.     Time  6    Period  Months    Status  On-going    Target Date  11/03/19      PT LONG TERM GOAL #5   Title  Sit to/from stand and stand-pivot transfers with RW & prosthesis with supervision.    Time  6    Period  Months    Status  On-going    Target Date  11/03/19            Plan - 06/15/19 2209    Clinical Impression Statement  PT educated patient & wife on toileting with prosthesis.  Patient & wife verbalized better understanding.  PT also educated patient on positioning prosthesis & increasing frequency of wear over duration.    Personal Factors and Comorbidities  Age;Comorbidity 3+;Fitness;Past/Current Experience;Time since onset of injury/illness/exacerbation    Comorbidities  Rt TTA, C2-5 Cervical Fusion 2014, Tachypnea, encephalopathy, RLE paralysis, TIA, A-Fib, CAD, CHF, CKD stage 3, Sz disorder, hx of vertigo, knee arthroplasty Rt X 2 & Lt X 1    Examination-Activity Limitations  Caring for Others;Locomotion Level;Reach Overhead;Squat;Stairs;Stand;Transfers    Examination-Participation Restrictions  Community Activity    Stability/Clinical Decision Making  Evolving/Moderate complexity    Rehab Potential  Good    PT Frequency  2x / week    PT Duration  Other (comment)   25 weeks (6 months)   PT Treatment/Interventions  ADLs/Self Care Home Management;DME Instruction;Gait training;Stair training;Functional mobility training;Therapeutic activities;Therapeutic exercise;Balance training;Neuromuscular re-education;Patient/family education;Prosthetic Training    PT Next Visit Plan  work towards updated STGs, check residual limb, continue with prosthetic training, standing & gait in parallel bars.    Consulted and Agree with Plan of Care  Patient;Family member/caregiver    Family Member Consulted  wife, Cauy Bussell       Patient will benefit from skilled therapeutic intervention in order to improve the following deficits and impairments:  Abnormal gait, Cardiopulmonary status  limiting activity, Decreased activity tolerance, Decreased balance, Decreased endurance, Decreased knowledge of use of DME, Decreased mobility, Decreased range of motion, Decreased skin integrity, Decreased strength, Difficulty walking, Increased edema, Impaired flexibility, Postural dysfunction, Prosthetic Dependency, Pain  Visit Diagnosis: Other abnormalities of gait and mobility  Unsteadiness on feet  Abnormal posture  History of fall  Stiffness of right knee, not elsewhere classified  Muscle weakness (generalized)  Contracture of muscle, multiple sites  Cervicalgia  Stiffness of left knee, not elsewhere classified     Problem List Patient Active Problem List   Diagnosis Date Noted  . Dehiscence of amputation stump ([place])   . Wound infection 05/09/2018  . AKI (acute kidney injury) ([place])   . S/P BKA (below knee amputation) unilateral, right ([place])   . Below-knee amputation with complication, initial encounter ([place])   . Tachypnea   . Post-operative pain   . Subacute osteomyelitis, right ankle and foot ([place])   . Paralysis of right lower extremity ([place])   . TIA (transient ischemic attack) 03/24/2018  . Encephalopathy 03/24/2018  . Cellulitis 03/15/2018  . Hypernatremia 03/15/2018  . Persistent atrial fibrillation ([place]) 11/10/2017  . Severe muscle deconditioning 11/10/2017  . Hemorrhage 10/22/2017  . CAD S/P percutaneous coronary angioplasty 10/22/2017  . Acute hypoxemic respiratory failure ([place]) 07/17/2017  . Aspiration syndrome, subsequent encounter 07/11/2017  . Aspiration pneumonia ([place]) 07/01/2017  . Acute encephalopathy 07/01/2017  . Thrombocytopenia ([place]) 07/01/2017  . Chronic diastolic (congestive) heart failure ([place]) 07/01/2017  . Anemia of chronic disease 07/01/2017  . Troponin level elevated 07/01/2017  . Chronic atrial fibrillation 07/01/2017  . Goals of care, counseling/discussion   . Palliative care by specialist   . Acute metabolic encephalopathy  123456  . CKD (chronic kidney disease) stage 3, GFR 30-59 ml/min   . CAP (community acquired pneumonia) 04/17/2017  . Hallux rigidus, right foot 03/10/2016  . Lumbosacral spondylosis without myelopathy 10/29/2015  . Memory difficulty 09/22/2015  . Abnormality of gait 09/22/2015  . Hyperglycemia 07/06/2015  . Chronic pain 07/06/2015  . Chronic insomnia 03/30/2015  . Transient alteration of awareness 03/30/2015  . Abnormal liver function   . Altered mental status 01/31/2015  . Essential hypertension 01/31/2015  . Constipation 01/31/2015  . Hypothyroidism 01/31/2015  . Seizure disorder ([place]) 01/31/2015  . Bladder outlet obstruction 01/31/2015  . GERD (gastroesophageal reflux disease) 01/31/2015  . Chronic back pain 01/31/2015  . Acute kidney injury ([place]) 01/31/2015    Jamey Reas  PT, DPT 06/15/2019, 10:12 PM  [facility] [address] [place], [place], [address] Phone: [phone]   Fax:  [phone]  Name: JENNY REESER MRN: [id number] Date of Birth: [birth year]

## 2019-06-18 ENCOUNTER — Encounter: Payer: Self-pay | Admitting: Physical Therapy

## 2019-06-18 ENCOUNTER — Other Ambulatory Visit: Payer: Self-pay

## 2019-06-18 ENCOUNTER — Ambulatory Visit (INDEPENDENT_AMBULATORY_CARE_PROVIDER_SITE_OTHER): Payer: Medicare Other | Admitting: Physical Therapy

## 2019-06-18 DIAGNOSIS — R531 Weakness: Secondary | ICD-10-CM | POA: Diagnosis not present

## 2019-06-18 DIAGNOSIS — M6281 Muscle weakness (generalized): Secondary | ICD-10-CM

## 2019-06-18 DIAGNOSIS — M542 Cervicalgia: Secondary | ICD-10-CM

## 2019-06-18 DIAGNOSIS — M6249 Contracture of muscle, multiple sites: Secondary | ICD-10-CM

## 2019-06-18 DIAGNOSIS — R2689 Other abnormalities of gait and mobility: Secondary | ICD-10-CM

## 2019-06-18 DIAGNOSIS — R2681 Unsteadiness on feet: Secondary | ICD-10-CM | POA: Diagnosis not present

## 2019-06-18 DIAGNOSIS — M25662 Stiffness of left knee, not elsewhere classified: Secondary | ICD-10-CM

## 2019-06-18 DIAGNOSIS — R293 Abnormal posture: Secondary | ICD-10-CM | POA: Diagnosis not present

## 2019-06-18 DIAGNOSIS — M25661 Stiffness of right knee, not elsewhere classified: Secondary | ICD-10-CM

## 2019-06-18 DIAGNOSIS — G8929 Other chronic pain: Secondary | ICD-10-CM

## 2019-06-18 DIAGNOSIS — M545 Low back pain: Secondary | ICD-10-CM

## 2019-06-18 NOTE — Therapy (Signed)
[place], [place], [address] Phone: [phone]   Fax:  [phone]  Physical Therapy Treatment  Patient Details  Name: [name] MRN: [id number] Date of Birth: 1933-05-22 Referring Provider (PT): Bevely Palmer Persons, [place]   Encounter Date: 06/18/2019  PT End of Session - 06/18/19 1449    Visit Number  9    Number of Visits  50    Date for PT Re-Evaluation  08/06/19    Authorization Type  BCBS & Medicare    PT Start Time  1313    PT Stop Time  1400    PT Time Calculation (min)  47 min    Equipment Utilized During Treatment  Gait belt    Activity Tolerance  Patient tolerated treatment well    Behavior During Therapy  [facility] for tasks assessed/performed       Past Medical History:  Diagnosis Date  . Abnormality of gait 09/22/2015  . Arthritis   . Atrial fibrillation ([place])   . CAD (coronary artery disease)    Stent to RCA, Penta stent, 99% reduced to 0% 2002.  . Cancer ([place])    skin CA removed from back  . Chronic insomnia 03/30/2015  . Complication of anesthesia    trouble waking up  . GERD (gastroesophageal reflux disease)   . Hypercholesteremia   . Hypertension   . Hypothyroidism   . Memory difficulty 09/22/2015  . Osteoarthritis   . Pneumonia   . Seizures ([place])   . Sepsis ([place]) 05/2017  . Transient alteration of awareness 03/30/2015  . Vertigo    hx of    Past Surgical History:  Procedure Laterality Date  . AMPUTATION Right 03/28/2018   Procedure: AMPUTATION BELOW KNEE;  Surgeon: Newt Minion, MD;  Location: [facility];  Service: Orthopedics;  Laterality: Right;  . APPLICATION OF WOUND VAC Right 01/23/2019   Procedure: Application Of  Prevena Wound Vac;  Surgeon: Newt Minion, MD;  Location: [facility];  Service: Orthopedics;  Laterality: Right;  . BACK SURGERY    . EYE SURGERY     Bilateral Cataract surgery   . HERNIA REPAIR    . I & D EXTREMITY Right 05/10/2015   Procedure: IRRIGATION AND DEBRIDEMENT  EXTREMITY;  Surgeon: Leanora Cover, MD;  Location: [facility];  Service: Orthopedics;  Laterality: Right;  . KNEE ARTHROPLASTY     right knee X 2; left knee once  . LAMINECTOMY     X 6  . LEG AMPUTATION BELOW KNEE Right 03/28/2018  . POSTERIOR CERVICAL FUSION/FORAMINOTOMY  01/28/2012   Procedure: POSTERIOR CERVICAL FUSION/FORAMINOTOMY LEVEL 3;  Surgeon: [name], MD;  Location: [facility];  Service: Neurosurgery;  Laterality: Left;  Posterior Cervical Five-Thoracic One Fusion, Arthrodesis with LEFT Cervical Seven-thoracic One Laminectomy, Foraminotomy and Resection of Synovial Cyst  . POSTERIOR CERVICAL FUSION/FORAMINOTOMY N/A 01/29/2013   Procedure: POSTERIOR CERVICAL FUSION/FORAMINOTOMY LEVEL 1 and C2-5 Posteriolateral Arthrodesis;  Surgeon: [name], MD;  Location: [facility];  Service: Neurosurgery;  Laterality: N/A;  C2-C3 Laminectomy C2-C3 posterior cervical arthrodesis  . STUMP REVISION Right 01/23/2019   Procedure: REVISION RIGHT BELOW KNEE AMPUTATION;  Surgeon: Newt Minion, MD;  Location: [facility];  Service: Orthopedics;  Laterality: Right;  . TONSILLECTOMY      There were no vitals filed for this visit.  Subjective Assessment - 06/18/19 1310    Subjective  He has been wearing prosthesis 2hrs 2x/day.  He has not been  able to try toileting that he was educated last session.  They plan to try when his son is visiting so he can help.    Pertinent History  Rt TTA, C2-5 Cervical Fusion 2014, Tachypnea, encephalopathy, RLE paralysis, TIA, A-Fib, CAD, CHF, CKD stage 3, Sz disorder, hx of vertigo, knee arthroplasty Rt X 2 & Lt X 1    Limitations  Lifting;Standing;Walking;House hold activities    Patient Stated Goals  To get prosthesis, go to bathroom alone, get around house, get out in community.    Currently in Pain?  No/denies    Pain Onset  More than a month ago    Pain Onset  More than a month ago                       [facility] Adult PT  Treatment/Exercise - 06/18/19 1315      Transfers   Transfers  Sit to Stand;Stand to Sit;Anterior-Posterior Transfer    Sit to Stand  4: Min assist;With upper extremity assist;With armrests;From chair/3-in-1;Other (comment)   pulling to sink    Sit to Stand Details  Tactile cues for weight shifting;Visual cues for safe use of DME/AE;Verbal cues for technique;Verbal cues for sequencing;Verbal cues for safe use of DME/AE    Stand to Sit  4: Min assist;With upper extremity assist;With armrests;To chair/3-in-1;Other (comment)   from sink to w/c BUEs on sink to lower   Stand to Sit Details (indicate cue type and reason)  Tactile cues for weight shifting;Visual cues for safe use of DME/AE;Verbal cues for technique;Verbal cues for sequencing;Verbal cues for safe use of DME/AE    Squat Pivot Transfers  4: Min assist;With upper extremity assistance   w/c <> Nustep with armrest removed   Squat Pivot Transfer Details (indicate cue type and reason)  tactile & verbal cues on forward shift over feet prior to pivoting.      Anterior-Posterior Transfer  --      Self-Care   Self-Care  --    ADL's  --      Neuro Re-ed    Neuro Re-ed Details   Standing at sink for 2 minutes 2 reps moving 3 cones from one side of sink to other with modA for equal wt bearing on BLEs / hip stability.  Progressed to moving 3 cones to & from eye level shelf with RUE support with modA       Knee/Hip Exercises: Aerobic   Nustep  Level 5 with BUEs & BLEs 6 minutes  HR 77 SpO2 99% 2nd set 100% & 69 HR      Prosthetics   Prosthetic Care Comments   positioning RLE in neutral rotation using towel roll lateral thigh and positioning foot in line with hip & knee.   Donning with pin straight & wt bear on socket with alignment with pin.      Current prosthetic wear tolerance (days/week)   daily     Current prosthetic wear tolerance (#hours/day)   2hrs 2x/day,  increase to 3hrs 2x/day    Current prosthetic weight-bearing tolerance  (hours/day)   2 minutes with no pain reported.     Edema  non-pitting edema     Residual limb condition   small opening on incision with scab present.  (smaller size opening) No drainage today after standing / gait nor after NuStep.      Education Provided  Skin check;Residual limb care;Prosthetic cleaning;Correct ply sock adjustment;Proper Donning;Proper Doffing;Proper wear schedule/adjustment;Other (comment)  see prosthetic care comments   Person(s) Educated  Patient;Spouse    Education Method  Explanation;Demonstration;Tactile cues;Verbal cues    Education Method  Verbalized understanding;Returned demonstration;Tactile cues required;Verbal cues required;Needs further instruction    Donning Prosthesis  Moderate assist    Doffing Prosthesis  Moderate assist               PT Short Term Goals - 06/11/19 1942      PT SHORT TERM GOAL #1   Title  Patient & wife demonstrate understanding of updated HEP. (All [facility] STGs: 07/09/2019)    Time  1    Period  Months    Status  Revised    Target Date  07/09/19      PT SHORT TERM GOAL #2   Title  Sit to/from stand at sink with minA / wife able to assist and stand for 3 minutes with min guard.    Time  1    Period  Months    Status  Revised    Target Date  07/09/19      PT SHORT TERM GOAL #3   Title  Wife & patient report adjusting ply socks with limb volume changes.    Time  1    Period  Months    Status  Revised    Target Date  07/09/19      PT SHORT TERM GOAL #4   Title  Patient tolerates prosthesis wear >6 hours total /day without increase in wound issues.    Time  1    Period  Months    Status  New    Target Date  07/09/19      PT SHORT TERM GOAL #5   Title  Patient ambulates 10' in parallel bars with modA.    Time  1    Period  Months    Status  New    Target Date  07/09/19        PT Long Term Goals - 05/18/19 1244      PT LONG TERM GOAL #1   Title  Patient and wife verbalize & demonstrate understanding of  proper prosthetic care to enable safe utilization of prosthesis. (All LTGs Target Date: 11/03/2019)    Time  6    Period  Months    Status  On-going    Target Date  11/03/19      PT LONG TERM GOAL #2   Title  Patient tolerates wear of prosthesis >90% of awake hours without skin or residual limb pain issues to enable function during his day.    Time  6    Period  Months    Status  On-going    Target Date  11/03/19      PT LONG TERM GOAL #3   Title  Standing balance with RW support: maintains upright 2 minutes, reaches 5" anteriorly and scans environment with supervision.    Time  6    Period  Months    Status  On-going    Target Date  11/03/19      PT LONG TERM GOAL #4   Title  Patient ambulates 100' with RW & prosthesis with wife's assistance / supervision and turns 180* to position to sit safely.    Time  6    Period  Months    Status  On-going    Target Date  11/03/19      PT LONG TERM GOAL #5   Title  Sit to/from stand and  stand-pivot transfers with RW & prosthesis with supervision.    Time  6    Period  Months    Status  On-going    Target Date  11/03/19            Plan - 06/18/19 2134    Clinical Impression Statement  Patient's wound is healing. PT instructed again in proper donning and he seems to have better understanding. His transfer improved when slows to perform anterior wt shift to lift buttocks before pivoting.  PT progressed standing activities at sink to include reaching with single UE (better with RUE support).    Personal Factors and Comorbidities  Age;Comorbidity 3+;Fitness;Past/Current Experience;Time since onset of injury/illness/exacerbation    Comorbidities  Rt TTA, C2-5 Cervical Fusion 2014, Tachypnea, encephalopathy, RLE paralysis, TIA, A-Fib, CAD, CHF, CKD stage 3, Sz disorder, hx of vertigo, knee arthroplasty Rt X 2 & Lt X 1    Examination-Activity Limitations  Caring for Others;Locomotion Level;Reach Overhead;Squat;Stairs;Stand;Transfers     Examination-Participation Restrictions  Community Activity    Stability/Clinical Decision Making  Evolving/Moderate complexity    Rehab Potential  Good    PT Frequency  2x / week    PT Duration  Other (comment)   25 weeks (6 months)   PT Treatment/Interventions  ADLs/Self Care Home Management;DME Instruction;Gait training;Stair training;Functional mobility training;Therapeutic activities;Therapeutic exercise;Balance training;Neuromuscular re-education;Patient/family education;Prosthetic Training    PT Next Visit Plan  Do 10th visit note,  work towards updated STGs, check residual limb, continue with prosthetic training, standing & gait in parallel bars.    Consulted and Agree with Plan of Care  Patient;Family member/caregiver    Family Member Consulted  wife, Jiovonni Goggins       Patient will benefit from skilled therapeutic intervention in order to improve the following deficits and impairments:  Abnormal gait, Cardiopulmonary status limiting activity, Decreased activity tolerance, Decreased balance, Decreased endurance, Decreased knowledge of use of DME, Decreased mobility, Decreased range of motion, Decreased skin integrity, Decreased strength, Difficulty walking, Increased edema, Impaired flexibility, Postural dysfunction, Prosthetic Dependency, Pain  Visit Diagnosis: Other abnormalities of gait and mobility  Unsteadiness on feet  Abnormal posture  Weakness generalized  Stiffness of right knee, not elsewhere classified  Muscle weakness (generalized)  Contracture of muscle, multiple sites  Cervicalgia  Stiffness of left knee, not elsewhere classified  Chronic midline low back pain without sciatica     Problem List Patient Active Problem List   Diagnosis Date Noted  . Dehiscence of amputation stump ([place])   . Wound infection 05/09/2018  . AKI (acute kidney injury) ([place])   . S/P BKA (below knee amputation) unilateral, right ([place])   . Below-knee amputation with  complication, initial encounter ([place])   . Tachypnea   . Post-operative pain   . Subacute osteomyelitis, right ankle and foot ([place])   . Paralysis of right lower extremity ([place])   . TIA (transient ischemic attack) 03/24/2018  . Encephalopathy 03/24/2018  . Cellulitis 03/15/2018  . Hypernatremia 03/15/2018  . Persistent atrial fibrillation ([place]) 11/10/2017  . Severe muscle deconditioning 11/10/2017  . Hemorrhage 10/22/2017  . CAD S/P percutaneous coronary angioplasty 10/22/2017  . Acute hypoxemic respiratory failure ([place]) 07/17/2017  . Aspiration syndrome, subsequent encounter 07/11/2017  . Aspiration pneumonia ([place]) 07/01/2017  . Acute encephalopathy 07/01/2017  . Thrombocytopenia ([place]) 07/01/2017  . Chronic diastolic (congestive) heart failure ([place]) 07/01/2017  . Anemia of chronic disease 07/01/2017  . Troponin level elevated 07/01/2017  . Chronic atrial fibrillation 07/01/2017  . Goals of care,  counseling/discussion   . Palliative care by specialist   . Acute metabolic encephalopathy [id number]  . CKD (chronic kidney disease) stage 3, GFR 30-59 ml/min   . CAP (community acquired pneumonia) 04/17/2017  . Hallux rigidus, right foot 03/10/2016  . Lumbosacral spondylosis without myelopathy 10/29/2015  . Memory difficulty 09/22/2015  . Abnormality of gait 09/22/2015  . Hyperglycemia 07/06/2015  . Chronic pain 07/06/2015  . Chronic insomnia 03/30/2015  . Transient alteration of awareness 03/30/2015  . Abnormal liver function   . Altered mental status 01/31/2015  . Essential hypertension 01/31/2015  . Constipation 01/31/2015  . Hypothyroidism 01/31/2015  . Seizure disorder ([place]) 01/31/2015  . Bladder outlet obstruction 01/31/2015  . GERD (gastroesophageal reflux disease) 01/31/2015  . Chronic back pain 01/31/2015  . Acute kidney injury ([place]) 01/31/2015    Jamey Reas PT, DPT 06/18/2019, 9:38 PM  [facility] Physical Therapy [address] [place], [place],  [address] Phone: [phone]   Fax:  [phone]  Name: [name] MRN: [id number] Date of Birth: 11-11-1933

## 2019-06-24 ENCOUNTER — Encounter: Payer: Self-pay | Admitting: Rehabilitative and Restorative Service Providers"

## 2019-06-24 ENCOUNTER — Ambulatory Visit (INDEPENDENT_AMBULATORY_CARE_PROVIDER_SITE_OTHER): Payer: Medicare Other | Admitting: Rehabilitative and Restorative Service Providers"

## 2019-06-24 ENCOUNTER — Other Ambulatory Visit: Payer: Self-pay

## 2019-06-24 DIAGNOSIS — R2681 Unsteadiness on feet: Secondary | ICD-10-CM | POA: Diagnosis not present

## 2019-06-24 DIAGNOSIS — R293 Abnormal posture: Secondary | ICD-10-CM

## 2019-06-24 DIAGNOSIS — M6281 Muscle weakness (generalized): Secondary | ICD-10-CM | POA: Diagnosis not present

## 2019-06-24 DIAGNOSIS — R531 Weakness: Secondary | ICD-10-CM | POA: Diagnosis not present

## 2019-06-24 DIAGNOSIS — Z9181 History of falling: Secondary | ICD-10-CM

## 2019-06-24 NOTE — Therapy (Signed)
[facility] [place], [place], [address] Phone: [phone]   Fax:  [phone]  Physical Therapy Treatment  Patient Details  Name: KEALA GORMLEY MRN: [id number] Date of Birth: 07/27/1933 Referring Provider (PT): Bevely Palmer Persons, [place]   Encounter Date: 06/24/2019  Progress Note Reporting Period 05/11/19 to 06/24/19   See note below for Objective Data and Assessment of Progress/Goals.    PT End of Session - 06/24/19 1607    Visit Number  10    Number of Visits  50    Date for PT Re-Evaluation  08/06/19    Authorization Type  BCBS & Medicare    PT Start Time  [id number]    PT Stop Time  1359    PT Time Calculation (min)  46 min    Equipment Utilized During Treatment  Gait belt    Activity Tolerance  Patient tolerated treatment well;Patient limited by fatigue    Behavior During Therapy  WFL for tasks assessed/performed       Past Medical History:  Diagnosis Date  . Abnormality of gait 09/22/2015  . Arthritis   . Atrial fibrillation ([place])   . CAD (coronary artery disease)    Stent to RCA, Penta stent, 99% reduced to 0% 2002.  . Cancer ([place])    skin CA removed from back  . Chronic insomnia 03/30/2015  . Complication of anesthesia    trouble waking up  . GERD (gastroesophageal reflux disease)   . Hypercholesteremia   . Hypertension   . Hypothyroidism   . Memory difficulty 09/22/2015  . Osteoarthritis   . Pneumonia   . Seizures ([place])   . Sepsis ([place]) 05/2017  . Transient alteration of awareness 03/30/2015  . Vertigo    hx of    Past Surgical History:  Procedure Laterality Date  . AMPUTATION Right 03/28/2018   Procedure: AMPUTATION BELOW KNEE;  Surgeon: Newt Minion, MD;  Location: [facility];  Service: Orthopedics;  Laterality: Right;  . APPLICATION OF WOUND VAC Right 01/23/2019   Procedure: Application Of  Prevena Wound Vac;  Surgeon: Newt Minion, MD;  Location: [facility];  Service: Orthopedics;  Laterality: Right;  . BACK  SURGERY    . EYE SURGERY     Bilateral Cataract surgery   . HERNIA REPAIR    . I & D EXTREMITY Right 05/10/2015   Procedure: IRRIGATION AND DEBRIDEMENT EXTREMITY;  Surgeon: Leanora Cover, MD;  Location: [facility];  Service: Orthopedics;  Laterality: Right;  . KNEE ARTHROPLASTY     right knee X 2; left knee once  . LAMINECTOMY     X 6  . LEG AMPUTATION BELOW KNEE Right 03/28/2018  . POSTERIOR CERVICAL FUSION/FORAMINOTOMY  01/28/2012   Procedure: POSTERIOR CERVICAL FUSION/FORAMINOTOMY LEVEL 3;  Surgeon: [name], MD;  Location: [facility];  Service: Neurosurgery;  Laterality: Left;  Posterior Cervical Five-Thoracic One Fusion, Arthrodesis with LEFT Cervical Seven-thoracic One Laminectomy, Foraminotomy and Resection of Synovial Cyst  . POSTERIOR CERVICAL FUSION/FORAMINOTOMY N/A 01/29/2013   Procedure: POSTERIOR CERVICAL FUSION/FORAMINOTOMY LEVEL 1 and C2-5 Posteriolateral Arthrodesis;  Surgeon: [name], MD;  Location: [facility];  Service: Neurosurgery;  Laterality: N/A;  C2-C3 Laminectomy C2-C3 posterior cervical arthrodesis  . STUMP REVISION Right 01/23/2019   Procedure: REVISION RIGHT BELOW KNEE AMPUTATION;  Surgeon: Newt Minion, MD;  Location: [facility];  Service: Orthopedics;  Laterality: Right;  . TONSILLECTOMY      There were no vitals filed for  this visit.  Subjective Assessment - 06/24/19 1311    Subjective  Patient reports that his left shoulder has been giving him trouble. Patient reports pain in R hip, but has been using lidocaine patch with benefit. patient is currently wearing prosthesis 2hrs, 1x/day.    Patient is accompained by:  Family member   wife - [name]   Pertinent History  Rt TTA, C2-5 Cervical Fusion 2014, Tachypnea, encephalopathy, RLE paralysis, TIA, A-Fib, CAD, CHF, CKD stage 3, Sz disorder, hx of vertigo, knee arthroplasty Rt X 2 & Lt X 1    Limitations  Lifting;Standing;Walking;House hold activities    Patient Stated Goals   To get prosthesis, go to bathroom alone, get around house, get out in community.    Pain Onset  More than a month ago    Pain Onset  More than a month ago                       [facility] Adult PT Treatment/Exercise - 06/24/19 1321      Transfers   Transfers  Sit to Stand;Stand to Sit;Anterior-Posterior Transfer    Sit to Stand  4: Min assist;With upper extremity assist;With armrests;From chair/3-in-1;Other (comment)   pulling to sink   Sit to Stand Details  Tactile cues for weight shifting;Visual cues for safe use of DME/AE;Verbal cues for technique;Verbal cues for sequencing;Verbal cues for safe use of DME/AE    Sit to Stand Details (indicate cue type and reason)  see comments, below    Stand to Sit  4: Min assist;With upper extremity assist;With armrests;To chair/3-in-1;Other (comment)   from sink to w/c BUEs on sink to lower   Stand to Sit Details (indicate cue type and reason)  Tactile cues for weight shifting;Visual cues for safe use of DME/AE;Verbal cues for technique;Verbal cues for sequencing;Verbal cues for safe use of DME/AE    Stand to Sit Details  see comments, below    Squat Pivot Transfers  --   w/c <> Nustep with armrest removed   Comments  Due to fatigue with today's presentation, PT required to place 2 Airdex pads under patient during attempted sit <> stand transfers at sink. Requires cueing for foot placement (to get feet underneath BOS), and cueing for anterior weight shift (defaults to near vertical "push" with B arms). Able to perform only 1 successful transfer into a full standing position at sink      Neuro Re-ed    Neuro Re-ed Details   --      Exercises   Exercises  Knee/Hip      Knee/Hip Exercises: Aerobic   Nustep  --      Shoulder Exercises: Seated   Other Seated Exercises  push up from wheelchair wiht cueing to "push" anteriorly and vertically wiht 5 second hold, 5 reps, 2 sets with rest break between sets    Other Seated Exercises  seated  glute set 10 reps, 2 sets followed by cueing to engage glutes when performing transfer to aid in reduction of strongly fwd flexed position at hips      Prosthetics   Prosthetic Care Comments   Worked on maintaining proper alignment with liner donning to allow proper pin & lock connection    Current prosthetic wear tolerance (days/week)   daily     Current prosthetic wear tolerance (#hours/day)   2 hrs 1x/day - instructed to increase to 2hrs, 2x/day with consistency    Current prosthetic weight-bearing tolerance (hours/day)   approximately  1 minute performed today with no pain reported     Edema  --    Residual limb condition   former scab on lateral aspect of incision appears to be very well healing when inspected by PT today; wife reports consistently improving    Education Provided  Skin check;Residual limb care;Correct ply sock adjustment;Proper Donning;Proper Doffing;Proper wear schedule/adjustment;Other (comment)   see prosthetic care comments   Person(s) Educated  Patient;Spouse    Education Method  Explanation;Demonstration;Tactile cues;Verbal cues    Education Method  Verbalized understanding;Returned demonstration;Tactile cues required;Verbal cues required;Needs further instruction    Donning Prosthesis  Moderate assist    Doffing Prosthesis  Moderate assist               PT Short Term Goals - 06/11/19 1942      PT SHORT TERM GOAL #1   Title  Patient & wife demonstrate understanding of updated HEP. (All [facility] STGs: 07/09/2019)    Time  1    Period  Months    Status  Revised    Target Date  07/09/19      PT SHORT TERM GOAL #2   Title  Sit to/from stand at sink with minA / wife able to assist and stand for 3 minutes with min guard.    Time  1    Period  Months    Status  Revised    Target Date  07/09/19      PT SHORT TERM GOAL #3   Title  Wife & patient report adjusting ply socks with limb volume changes.    Time  1    Period  Months    Status  Revised    Target  Date  07/09/19      PT SHORT TERM GOAL #4   Title  Patient tolerates prosthesis wear >6 hours total /day without increase in wound issues.    Time  1    Period  Months    Status  New    Target Date  07/09/19      PT SHORT TERM GOAL #5   Title  Patient ambulates 10' in parallel bars with modA.    Time  1    Period  Months    Status  New    Target Date  07/09/19        PT Long Term Goals - 05/18/19 1244      PT LONG TERM GOAL #1   Title  Patient and wife verbalize & demonstrate understanding of proper prosthetic care to enable safe utilization of prosthesis. (All LTGs Target Date: 11/03/2019)    Time  6    Period  Months    Status  On-going    Target Date  11/03/19      PT LONG TERM GOAL #2   Title  Patient tolerates wear of prosthesis >90% of awake hours without skin or residual limb pain issues to enable function during his day.    Time  6    Period  Months    Status  On-going    Target Date  11/03/19      PT LONG TERM GOAL #3   Title  Standing balance with RW support: maintains upright 2 minutes, reaches 5" anteriorly and scans environment with supervision.    Time  6    Period  Months    Status  On-going    Target Date  11/03/19      PT LONG TERM GOAL #4  Title  Patient ambulates 100' with RW & prosthesis with wife's assistance / supervision and turns 180* to position to sit safely.    Time  6    Period  Months    Status  On-going    Target Date  11/03/19      PT LONG TERM GOAL #5   Title  Sit to/from stand and stand-pivot transfers with RW & prosthesis with supervision.    Time  6    Period  Months    Status  On-going    Target Date  11/03/19            Plan - 06/24/19 1608    Clinical Impression Statement  Patient's wound appears to continue to be healing well. Patient demonstrated marked fatigue today when attempting to perform sink balance exercises making this sit <> stand/anterior weight shift challenging. Greatly benefits from cueing to  emphasize anerior weight shift instead of vertical lift when attempting sit to stand transfer. He will benefit from continued skilled PT in order to maximize functional mobility and decrease fall risk.    Personal Factors and Comorbidities  Age;Comorbidity 3+;Fitness;Past/Current Experience;Time since onset of injury/illness/exacerbation    Comorbidities  Rt TTA, C2-5 Cervical Fusion 2014, Tachypnea, encephalopathy, RLE paralysis, TIA, A-Fib, CAD, CHF, CKD stage 3, Sz disorder, hx of vertigo, knee arthroplasty Rt X 2 & Lt X 1    Examination-Activity Limitations  Caring for Others;Locomotion Level;Reach Overhead;Squat;Stairs;Stand;Transfers    Examination-Participation Restrictions  Community Activity    Stability/Clinical Decision Making  Evolving/Moderate complexity    Rehab Potential  Good    PT Frequency  2x / week    PT Duration  Other (comment)   25 weeks (6 months)   PT Treatment/Interventions  ADLs/Self Care Home Management;DME Instruction;Gait training;Stair training;Functional mobility training;Therapeutic activities;Therapeutic exercise;Balance training;Neuromuscular re-education;Patient/family education;Prosthetic Training    PT Next Visit Plan  work towards updated STGs, check residual limb, continue with prosthetic training, standing & gait in parallel bars and/or at sink    Consulted and Agree with Plan of Care  Patient;Family member/caregiver    Family Member Consulted  wife, Brandan Vannorman       Patient will benefit from skilled therapeutic intervention in order to improve the following deficits and impairments:  Abnormal gait, Cardiopulmonary status limiting activity, Decreased activity tolerance, Decreased balance, Decreased endurance, Decreased knowledge of use of DME, Decreased mobility, Decreased range of motion, Decreased skin integrity, Decreased strength, Difficulty walking, Increased edema, Impaired flexibility, Postural dysfunction, Prosthetic Dependency, Pain  Visit  Diagnosis: Unsteadiness on feet  Abnormal posture  Weakness generalized  Muscle weakness (generalized)  History of fall     Problem List Patient Active Problem List   Diagnosis Date Noted  . Dehiscence of amputation stump ([place])   . Wound infection 05/09/2018  . AKI (acute kidney injury) ([place])   . S/P BKA (below knee amputation) unilateral, right ([place])   . Below-knee amputation with complication, initial encounter ([place])   . Tachypnea   . Post-operative pain   . Subacute osteomyelitis, right ankle and foot ([place])   . Paralysis of right lower extremity ([place])   . TIA (transient ischemic attack) 03/24/2018  . Encephalopathy 03/24/2018  . Cellulitis 03/15/2018  . Hypernatremia 03/15/2018  . Persistent atrial fibrillation ([place]) 11/10/2017  . Severe muscle deconditioning 11/10/2017  . Hemorrhage 10/22/2017  . CAD S/P percutaneous coronary angioplasty 10/22/2017  . Acute hypoxemic respiratory failure ([place]) 07/17/2017  . Aspiration syndrome, subsequent encounter 07/11/2017  . Aspiration pneumonia ([place])  07/01/2017  . Acute encephalopathy 07/01/2017  . Thrombocytopenia ([place]) 07/01/2017  . Chronic diastolic (congestive) heart failure ([place]) 07/01/2017  . Anemia of chronic disease 07/01/2017  . Troponin level elevated 07/01/2017  . Chronic atrial fibrillation 07/01/2017  . Goals of care, counseling/discussion   . Palliative care by specialist   . Acute metabolic encephalopathy [id number]  . CKD (chronic kidney disease) stage 3, GFR 30-59 ml/min   . CAP (community acquired pneumonia) 04/17/2017  . Hallux rigidus, right foot 03/10/2016  . Lumbosacral spondylosis without myelopathy 10/29/2015  . Memory difficulty 09/22/2015  . Abnormality of gait 09/22/2015  . Hyperglycemia 07/06/2015  . Chronic pain 07/06/2015  . Chronic insomnia 03/30/2015  . Transient alteration of awareness 03/30/2015  . Abnormal liver function   . Altered mental status 01/31/2015  . Essential hypertension  01/31/2015  . Constipation 01/31/2015  . Hypothyroidism 01/31/2015  . Seizure disorder ([place]) 01/31/2015  . Bladder outlet obstruction 01/31/2015  . GERD (gastroesophageal reflux disease) 01/31/2015  . Chronic back pain 01/31/2015  . Acute kidney injury [facility]) 01/31/2015    [place], PT, DPT  06/24/2019, 4:11 PM  [facility] Physical Therapy [address] [place], [place], [address] Phone: [phone]   Fax:  [phone]  Name: JENO DEBOCK MRN: [id number] Date of Birth: 08/03/1933

## 2019-06-25 ENCOUNTER — Ambulatory Visit (INDEPENDENT_AMBULATORY_CARE_PROVIDER_SITE_OTHER): Payer: Medicare Other | Admitting: Rehabilitative and Restorative Service Providers"

## 2019-06-25 ENCOUNTER — Encounter: Payer: Self-pay | Admitting: Rehabilitative and Restorative Service Providers"

## 2019-06-25 DIAGNOSIS — Z9181 History of falling: Secondary | ICD-10-CM

## 2019-06-25 DIAGNOSIS — M6281 Muscle weakness (generalized): Secondary | ICD-10-CM | POA: Diagnosis not present

## 2019-06-25 DIAGNOSIS — R293 Abnormal posture: Secondary | ICD-10-CM | POA: Diagnosis not present

## 2019-06-25 DIAGNOSIS — R531 Weakness: Secondary | ICD-10-CM

## 2019-06-25 DIAGNOSIS — R2681 Unsteadiness on feet: Secondary | ICD-10-CM | POA: Diagnosis not present

## 2019-06-25 NOTE — Patient Instructions (Signed)
Access Code: [email or web address] URL: [email or web address] Date: 06/25/2019 Prepared by: Rosanne Ashing  Exercises Seated Gluteal Sets - 2 x daily - 7 x weekly - 2 sets - 10 reps - 3-5 seconds hold

## 2019-06-25 NOTE — Therapy (Signed)
[facility] [address] [place], [place], [address] Phone: [phone]   Fax:  [phone]  Physical Therapy Treatment  Patient Details  Name: [name] MRN: [id number] Date of Birth: 1933-07-14 Referring Provider (PT): Bevely Palmer Persons, [place]   Encounter Date: 06/25/2019  PT End of Session - 06/25/19 1542    Visit Number  11    Number of Visits  50    Date for PT Re-Evaluation  08/06/19    Authorization Type  BCBS & Medicare    PT Start Time  1447    PT Stop Time  1538    PT Time Calculation (min)  51 min    Equipment Utilized During Treatment  Gait belt    Activity Tolerance  Patient tolerated treatment well;Patient limited by fatigue    Behavior During Therapy  [facility] for tasks assessed/performed       Past Medical History:  Diagnosis Date  . Abnormality of gait 09/22/2015  . Arthritis   . Atrial fibrillation ([place])   . CAD (coronary artery disease)    Stent to RCA, Penta stent, 99% reduced to 0% 2002.  . Cancer ([place])    skin CA removed from back  . Chronic insomnia 03/30/2015  . Complication of anesthesia    trouble waking up  . GERD (gastroesophageal reflux disease)   . Hypercholesteremia   . Hypertension   . Hypothyroidism   . Memory difficulty 09/22/2015  . Osteoarthritis   . Pneumonia   . Seizures ([place])   . Sepsis ([place]) 05/2017  . Transient alteration of awareness 03/30/2015  . Vertigo    hx of    Past Surgical History:  Procedure Laterality Date  . AMPUTATION Right 03/28/2018   Procedure: AMPUTATION BELOW KNEE;  Surgeon: Newt Minion, MD;  Location: [facility];  Service: Orthopedics;  Laterality: Right;  . APPLICATION OF WOUND VAC Right 01/23/2019   Procedure: Application Of  Prevena Wound Vac;  Surgeon: Newt Minion, MD;  Location: [facility];  Service: Orthopedics;  Laterality: Right;  . BACK SURGERY    . EYE SURGERY     Bilateral Cataract surgery   . HERNIA REPAIR    . I & D EXTREMITY Right 05/10/2015   Procedure:  IRRIGATION AND DEBRIDEMENT EXTREMITY;  Surgeon: Leanora Cover, MD;  Location: [facility];  Service: Orthopedics;  Laterality: Right;  . KNEE ARTHROPLASTY     right knee X 2; left knee once  . LAMINECTOMY     X 6  . LEG AMPUTATION BELOW KNEE Right 03/28/2018  . POSTERIOR CERVICAL FUSION/FORAMINOTOMY  01/28/2012   Procedure: POSTERIOR CERVICAL FUSION/FORAMINOTOMY LEVEL 3;  Surgeon: [name], MD;  Location: [place] [facility];  Service: Neurosurgery;  Laterality: Left;  Posterior Cervical Five-Thoracic One Fusion, Arthrodesis with LEFT Cervical Seven-thoracic One Laminectomy, Foraminotomy and Resection of Synovial Cyst  . POSTERIOR CERVICAL FUSION/FORAMINOTOMY N/A 01/29/2013   Procedure: POSTERIOR CERVICAL FUSION/FORAMINOTOMY LEVEL 1 and C2-5 Posteriolateral Arthrodesis;  Surgeon: [name], MD;  Location: [place] [facility];  Service: Neurosurgery;  Laterality: N/A;  C2-C3 Laminectomy C2-C3 posterior cervical arthrodesis  . STUMP REVISION Right 01/23/2019   Procedure: REVISION RIGHT BELOW KNEE AMPUTATION;  Surgeon: Newt Minion, MD;  Location: [facility];  Service: Orthopedics;  Laterality: Right;  . TONSILLECTOMY      There were no vitals filed for this visit.  Subjective Assessment - 06/25/19 1445    Subjective  Patient reports his R hip is giving him trouble  today. Sometimes he has pain in the L hip, too. Shoulders were sore after yesterday's session (working at [employer]).    Patient is accompained by:  Family member   wife - [name]   Pertinent History  Rt TTA, C2-5 Cervical Fusion 2014, Tachypnea, encephalopathy, RLE paralysis, TIA, A-Fib, CAD, CHF, CKD stage 3, Sz disorder, hx of vertigo, knee arthroplasty Rt X 2 & Lt X 1    Limitations  Lifting;Standing;Walking;House hold activities    Patient Stated Goals  To get prosthesis, go to bathroom alone, get around house, get out in community.    Currently in Pain?  Yes    Pain Score  7     Pain Location  Hip    Pain  Orientation  Right    Pain Type  Chronic pain    Pain Onset  More than a month ago                       [facility] Adult PT Treatment/Exercise - 06/25/19 1449      Transfers   Transfers  Sit to Stand;Stand to Sit;Anterior-Posterior Transfer    Sit to Stand  4: Min assist;With upper extremity assist;With armrests;From chair/3-in-1;Other (comment);3: Mod assist   pulling to sink   Sit to Stand Details  Tactile cues for weight shifting;Visual cues for safe use of DME/AE;Verbal cues for technique;Verbal cues for sequencing;Verbal cues for safe use of DME/AE    Sit to Stand Details (indicate cue type and reason)  able to perform one full, successful sit to stand (pull to stand) from w/c to sink requiring mod A from PT, but unable to perform full standing posture when at "top" of standing motion (see comments, below)    Stand to Sit  4: Min assist;With upper extremity assist;With armrests;To chair/3-in-1;Other (comment)   from sink to w/c BUEs on sink to lower   Stand to Sit Details (indicate cue type and reason)  Tactile cues for weight shifting;Visual cues for safe use of DME/AE;Verbal cues for technique;Verbal cues for sequencing;Verbal cues for safe use of DME/AE    Squat Pivot Transfers  --    Comments  Improvement compared to yesterday's session in ability to weight shift anteriorly when attempting to perform transfer; however, unable to perform full, tall standing position due to heavy reliance on B forearms on sink (unable to perform transfer without forearms resting on counter/sink - even with cueing to "hold" lip of sink - not strong enough support to perform transfer. Even attempted transfer with 2 Airdex pads under patient. Required many cues for weight shift (emphasis on anterior weight shift) and foot/prosthesis placement. Once in squat-standing position, demo STRONG left lateral shift to offweight prosthesis even with PT providing mod A to correct shift.       Exercises    Exercises  Knee/Hip      Shoulder Exercises: Seated   Other Seated Exercises  push up from wheelchair wiht cueing to "push" anteriorly and vertically wiht 5 second hold, 5 reps, 2 sets with rest break between sets    Other Seated Exercises  seated glute set 10 reps, 2 sets followed by cueing to engage glutes when performing transfer to aid in reduction of strongly fwd flexed position at hips      Prosthetics   Prosthetic Care Comments   Notable improvement today in donning liner and sock (3 ply) to date with use of mirror    Current prosthetic wear tolerance (days/week)   daily  Current prosthetic wear tolerance (#hours/day)   2hrs, 2x/day     Current prosthetic weight-bearing tolerance (hours/day)   no pain reported with today's attempted weight bearing activities     Residual limb condition   scab on lateral aspect of incision continues to look good/healing well. Two, small, flat scratch-like marks noted along proximal tibia on non-weight bearing/pressure receptive surfaces/location. No drainage, surrounding reddness or soreness. Discussed importance of caution wiht fingernails on skin when donning shrinker    Education Provided  Skin check;Residual limb care;Proper Donning;Proper Doffing;Proper wear schedule/adjustment;Other (comment)   see prosthetic care comments   Person(s) Educated  Patient;Spouse    Education Method  Explanation;Demonstration;Tactile cues;Verbal cues    Education Method  Verbalized understanding;Returned demonstration;Tactile cues required;Verbal cues required;Needs further instruction    Donning Prosthesis  Minimal assist   see prosthetic care comments for detail   Doffing Prosthesis  Minimal assist       Vital signs (HR and SpO2%) monitored throughout session and remained stable and unremarkable with today's exercise and activities.       PT Education - 06/25/19 1540    Education Details  updated HEP for hip strengthening (would benefit from progression of  this at future session (see patient instructions)    Person(s) Educated  Patient;Spouse    Methods  Explanation;Demonstration;Tactile cues;Verbal cues;Handout    Comprehension  Verbalized understanding;Returned demonstration;Need further instruction       PT Short Term Goals - 06/11/19 1942      PT SHORT TERM GOAL #1   Title  Patient & wife demonstrate understanding of updated HEP. (All Target STGs: 07/09/2019)    Time  1    Period  Months    Status  Revised    Target Date  07/09/19      PT SHORT TERM GOAL #2   Title  Sit to/from stand at sink with minA / wife able to assist and stand for 3 minutes with min guard.    Time  1    Period  Months    Status  Revised    Target Date  07/09/19      PT SHORT TERM GOAL #3   Title  Wife & patient report adjusting ply socks with limb volume changes.    Time  1    Period  Months    Status  Revised    Target Date  07/09/19      PT SHORT TERM GOAL #4   Title  Patient tolerates prosthesis wear >6 hours total /day without increase in wound issues.    Time  1    Period  Months    Status  New    Target Date  07/09/19      PT SHORT TERM GOAL #5   Title  Patient ambulates 10' in parallel bars with modA.    Time  1    Period  Months    Status  New    Target Date  07/09/19        PT Long Term Goals - 05/18/19 1244      PT LONG TERM GOAL #1   Title  Patient and wife verbalize & demonstrate understanding of proper prosthetic care to enable safe utilization of prosthesis. (All LTGs Target Date: 11/03/2019)    Time  6    Period  Months    Status  On-going    Target Date  11/03/19      PT LONG TERM GOAL #2   Title  Patient  tolerates wear of prosthesis >90% of awake hours without skin or residual limb pain issues to enable function during his day.    Time  6    Period  Months    Status  On-going    Target Date  11/03/19      PT LONG TERM GOAL #3   Title  Standing balance with RW support: maintains upright 2 minutes, reaches 5"  anteriorly and scans environment with supervision.    Time  6    Period  Months    Status  On-going    Target Date  11/03/19      PT LONG TERM GOAL #4   Title  Patient ambulates 100' with RW & prosthesis with wife's assistance / supervision and turns 180* to position to sit safely.    Time  6    Period  Months    Status  On-going    Target Date  11/03/19      PT LONG TERM GOAL #5   Title  Sit to/from stand and stand-pivot transfers with RW & prosthesis with supervision.    Time  6    Period  Months    Status  On-going    Target Date  11/03/19            Plan - 06/25/19 1556    Clinical Impression Statement  Today's session focused on initiation of hip strengthening HEP in light of patient's great struggle to complete sit to stand transfer at sink. He tolerated today's session well, but becomes fatigued with ease. Vital signs (HR, SpO2%) monitored througout session, but demonstrated unremarkable fluctuations throughout session. He will benefit from continued skilled PT in order to maximize functional mobility and improve strength.    Personal Factors and Comorbidities  Age;Comorbidity 3+;Fitness;Past/Current Experience;Time since onset of injury/illness/exacerbation    Comorbidities  Rt TTA, C2-5 Cervical Fusion 2014, Tachypnea, encephalopathy, RLE paralysis, TIA, A-Fib, CAD, CHF, CKD stage 3, Sz disorder, hx of vertigo, knee arthroplasty Rt X 2 & Lt X 1    Examination-Activity Limitations  Caring for Others;Locomotion Level;Reach Overhead;Squat;Stairs;Stand;Transfers    Examination-Participation Restrictions  Community Activity    Stability/Clinical Decision Making  Evolving/Moderate complexity    Rehab Potential  Good    PT Frequency  2x / week    PT Duration  Other (comment)   25 weeks (6 months)   PT Treatment/Interventions  ADLs/Self Care Home Management;DME Instruction;Gait training;Stair training;Functional mobility training;Therapeutic activities;Therapeutic  exercise;Balance training;Neuromuscular re-education;Patient/family education;Prosthetic Training    PT Next Visit Plan  work towards updated STGs, check residual limb (2 new light "scratch" marks on proximal tibia?), update HEP with more hip strengthening to be performed in sitting/supine, continue with prosthetic training, standing & gait in parallel bars and/or at sink    Consulted and Agree with Plan of Care  Patient;Family member/caregiver    Family Member Consulted  wife, Kc Mancilla       Patient will benefit from skilled therapeutic intervention in order to improve the following deficits and impairments:  Abnormal gait, Cardiopulmonary status limiting activity, Decreased activity tolerance, Decreased balance, Decreased endurance, Decreased knowledge of use of DME, Decreased mobility, Decreased range of motion, Decreased skin integrity, Decreased strength, Difficulty walking, Increased edema, Impaired flexibility, Postural dysfunction, Prosthetic Dependency, Pain  Visit Diagnosis: Unsteadiness on feet  Abnormal posture  Weakness generalized  Muscle weakness (generalized)  History of fall     Problem List Patient Active Problem List   Diagnosis Date Noted  . Dehiscence of  amputation stump ([place])   . Wound infection 05/09/2018  . AKI (acute kidney injury) ([place])   . S/P BKA (below knee amputation) unilateral, right ([place])   . Below-knee amputation with complication, initial encounter ([place])   . Tachypnea   . Post-operative pain   . Subacute osteomyelitis, right ankle and foot ([place])   . Paralysis of right lower extremity ([place])   . TIA (transient ischemic attack) 03/24/2018  . Encephalopathy 03/24/2018  . Cellulitis 03/15/2018  . Hypernatremia 03/15/2018  . Persistent atrial fibrillation ([place]) 11/10/2017  . Severe muscle deconditioning 11/10/2017  . Hemorrhage 10/22/2017  . CAD S/P percutaneous coronary angioplasty 10/22/2017  . Acute hypoxemic respiratory failure ([place])  07/17/2017  . Aspiration syndrome, subsequent encounter 07/11/2017  . Aspiration pneumonia ([place]) 07/01/2017  . Acute encephalopathy 07/01/2017  . Thrombocytopenia ([place]) 07/01/2017  . Chronic diastolic (congestive) heart failure ([place]) 07/01/2017  . Anemia of chronic disease 07/01/2017  . Troponin level elevated 07/01/2017  . Chronic atrial fibrillation 07/01/2017  . Goals of care, counseling/discussion   . Palliative care by specialist   . Acute metabolic encephalopathy [id number]  . CKD (chronic kidney disease) stage 3, GFR 30-59 ml/min   . CAP (community acquired pneumonia) 04/17/2017  . Hallux rigidus, right foot 03/10/2016  . Lumbosacral spondylosis without myelopathy 10/29/2015  . Memory difficulty 09/22/2015  . Abnormality of gait 09/22/2015  . Hyperglycemia 07/06/2015  . Chronic pain 07/06/2015  . Chronic insomnia 03/30/2015  . Transient alteration of awareness 03/30/2015  . Abnormal liver function   . Altered mental status 01/31/2015  . Essential hypertension 01/31/2015  . Constipation 01/31/2015  . Hypothyroidism 01/31/2015  . Seizure disorder ([place]) 01/31/2015  . Bladder outlet obstruction 01/31/2015  . GERD (gastroesophageal reflux disease) 01/31/2015  . Chronic back pain 01/31/2015  . Acute kidney injury [name] 01/31/2015    Muniz, PT, DPT  06/25/2019, 4:02 PM  [facility] [address] [place], [place], [address] Phone: [phone]   Fax:  [phone]  Name: MAYAR PIROLLI MRN: [id number] Date of Birth: February 21, 1934

## 2019-06-26 ENCOUNTER — Other Ambulatory Visit: Payer: Self-pay

## 2019-06-26 ENCOUNTER — Other Ambulatory Visit: Payer: Self-pay | Admitting: Cardiovascular Disease

## 2019-06-26 MED ORDER — DOXAZOSIN MESYLATE 4 MG PO TABS: 4.0000 mg | ORAL_TABLET | Freq: Every day | ORAL | 1 refills | Status: DC

## 2019-06-26 NOTE — Telephone Encounter (Signed)
*  STAT* If patient is at the pharmacy, call can be transferred to refill team.   1. Which medications need to be refilled? (please list name of each medication and dose if known)   doxazosin (CARDURA) 4 MG tablet   2. Which pharmacy/location (including street and city if local pharmacy) is medication to be sent to? [facility] [place]  3. Do they need a 30 day or 90 day supply? 90 day supply  Patient only has 2 tablets left

## 2019-06-26 NOTE — Telephone Encounter (Signed)
Please refer to PCP or Urologist. If they are having a hard time getting the Rx from them, OK to refill for 30 days.

## 2019-06-29 ENCOUNTER — Ambulatory Visit (INDEPENDENT_AMBULATORY_CARE_PROVIDER_SITE_OTHER): Payer: Medicare Other | Admitting: Physical Therapy

## 2019-06-29 ENCOUNTER — Other Ambulatory Visit: Payer: Self-pay

## 2019-06-29 ENCOUNTER — Encounter: Payer: Self-pay | Admitting: Physical Therapy

## 2019-06-29 DIAGNOSIS — M545 Low back pain: Secondary | ICD-10-CM

## 2019-06-29 DIAGNOSIS — M25662 Stiffness of left knee, not elsewhere classified: Secondary | ICD-10-CM

## 2019-06-29 DIAGNOSIS — R293 Abnormal posture: Secondary | ICD-10-CM

## 2019-06-29 DIAGNOSIS — M25661 Stiffness of right knee, not elsewhere classified: Secondary | ICD-10-CM

## 2019-06-29 DIAGNOSIS — R2689 Other abnormalities of gait and mobility: Secondary | ICD-10-CM | POA: Diagnosis not present

## 2019-06-29 DIAGNOSIS — M6249 Contracture of muscle, multiple sites: Secondary | ICD-10-CM

## 2019-06-29 DIAGNOSIS — R531 Weakness: Secondary | ICD-10-CM | POA: Diagnosis not present

## 2019-06-29 DIAGNOSIS — R2681 Unsteadiness on feet: Secondary | ICD-10-CM

## 2019-06-29 DIAGNOSIS — G8929 Other chronic pain: Secondary | ICD-10-CM

## 2019-06-29 DIAGNOSIS — M6281 Muscle weakness (generalized): Secondary | ICD-10-CM

## 2019-06-29 DIAGNOSIS — M542 Cervicalgia: Secondary | ICD-10-CM

## 2019-06-29 NOTE — Therapy (Signed)
[facility] [place], [place], [address] Phone: [phone]   Fax:  [phone]  Physical Therapy Treatment  Patient Details  Name: [name] MRN: [id number] Date of Birth: 1934/01/16 Referring Provider (PT): Bevely Palmer Persons, [place]   Encounter Date: 06/29/2019  PT End of Session - 06/29/19 1239    Visit Number  12    Number of Visits  50    Date for PT Re-Evaluation  08/06/19    Authorization Type  BCBS & Medicare    PT Start Time  1102    PT Stop Time  1150    PT Time Calculation (min)  48 min    Equipment Utilized During Treatment  Gait belt    Activity Tolerance  Patient tolerated treatment well;Patient limited by fatigue    Behavior During Therapy  [facility] for tasks assessed/performed       Past Medical History:  Diagnosis Date  . Abnormality of gait 09/22/2015  . Arthritis   . Atrial fibrillation ([place])   . CAD (coronary artery disease)    Stent to RCA, Penta stent, 99% reduced to 0% 2002.  . Cancer ([place])    skin CA removed from back  . Chronic insomnia 03/30/2015  . Complication of anesthesia    trouble waking up  . GERD (gastroesophageal reflux disease)   . Hypercholesteremia   . Hypertension   . Hypothyroidism   . Memory difficulty 09/22/2015  . Osteoarthritis   . Pneumonia   . Seizures ([place])   . Sepsis ([place]) 05/2017  . Transient alteration of awareness 03/30/2015  . Vertigo    hx of    Past Surgical History:  Procedure Laterality Date  . AMPUTATION Right 03/28/2018   Procedure: AMPUTATION BELOW KNEE;  Surgeon: Newt Minion, MD;  Location: [place];  Service: Orthopedics;  Laterality: Right;  . APPLICATION OF WOUND VAC Right 01/23/2019   Procedure: Application Of  Prevena Wound Vac;  Surgeon: Newt Minion, MD;  Location: [place];  Service: Orthopedics;  Laterality: Right;  . BACK SURGERY    . EYE SURGERY     Bilateral Cataract surgery   . HERNIA REPAIR    . I & D EXTREMITY Right 05/10/2015   Procedure:  IRRIGATION AND DEBRIDEMENT EXTREMITY;  Surgeon: Leanora Cover, MD;  Location: [place];  Service: Orthopedics;  Laterality: Right;  . KNEE ARTHROPLASTY     right knee X 2; left knee once  . LAMINECTOMY     X 6  . LEG AMPUTATION BELOW KNEE Right 03/28/2018  . POSTERIOR CERVICAL FUSION/FORAMINOTOMY  01/28/2012   Procedure: POSTERIOR CERVICAL FUSION/FORAMINOTOMY LEVEL 3;  Surgeon: [name], MD;  Location: [place] [facility];  Service: Neurosurgery;  Laterality: Left;  Posterior Cervical Five-Thoracic One Fusion, Arthrodesis with LEFT Cervical Seven-thoracic One Laminectomy, Foraminotomy and Resection of Synovial Cyst  . POSTERIOR CERVICAL FUSION/FORAMINOTOMY N/A 01/29/2013   Procedure: POSTERIOR CERVICAL FUSION/FORAMINOTOMY LEVEL 1 and C2-5 Posteriolateral Arthrodesis;  Surgeon: [name], MD;  Location: [place] [facility];  Service: Neurosurgery;  Laterality: N/A;  C2-C3 Laminectomy C2-C3 posterior cervical arthrodesis  . STUMP REVISION Right 01/23/2019   Procedure: REVISION RIGHT BELOW KNEE AMPUTATION;  Surgeon: Newt Minion, MD;  Location: [place];  Service: Orthopedics;  Laterality: Right;  . TONSILLECTOMY      There were no vitals filed for this visit.  Subjective Assessment - 06/29/19 1103    Subjective  He is wearing prosthesis daily 4-6 hrs once/day.  Patient is accompained by:  Family member   wife - [name]   Pertinent History  Rt TTA, C2-5 Cervical Fusion 2014, Tachypnea, encephalopathy, RLE paralysis, TIA, A-Fib, CAD, CHF, CKD stage 3, Sz disorder, hx of vertigo, knee arthroplasty Rt X 2 & Lt X 1    Limitations  Lifting;Standing;Walking;House hold activities    Patient Stated Goals  To get prosthesis, go to bathroom alone, get around house, get out in community.    Currently in Pain?  Yes    Pain Score  8     Pain Location  Shoulder    Pain Orientation  Left    Pain Descriptors / Indicators  Aching;Sore    Pain Onset  More than a month ago    Pain  Frequency  Intermittent    Aggravating Factors   arthritis    Pain Relieving Factors  medications    Pain Score  6    Pain Location  Hip    Pain Orientation  Right    Pain Descriptors / Indicators  Aching;Sore    Pain Type  Chronic pain   arthritic   Pain Onset  More than a month ago    Pain Frequency  Intermittent    Aggravating Factors   arthritis    Pain Relieving Factors  medications                       [facility] Adult PT Treatment/Exercise - 06/29/19 1103      Transfers   Transfers  Sit to Stand;Stand to Sit;Anterior-Posterior Transfer;Lateral/Scoot Transfers    Sit to Stand  3: Mod assist;With upper extremity assist;With armrests;From chair/3-in-1;Other (comment)   To RW   Sit to Stand Details  Tactile cues for weight shifting;Visual cues for safe use of DME/AE;Verbal cues for technique;Verbal cues for sequencing;Verbal cues for safe use of DME/AE    Stand to Sit  4: Min assist;With upper extremity assist;With armrests;To chair/3-in-1;Other (comment)   from RW   Stand to Sit Details (indicate cue type and reason)  Tactile cues for weight shifting;Visual cues for safe use of DME/AE;Verbal cues for technique;Verbal cues for sequencing;Verbal cues for safe use of DME/AE    Squat Pivot Transfers  3: Mod assist;With upper extremity assistance   w/c to/from Nustep with armrest removed   Squat Pivot Transfer Details (indicate cue type and reason)  verbal, tactile/manual & visual cues on forward wt shift prior to pivoting    Lateral/Scoot Transfers  4: Min assist;With armrests removed;With slide board   w/c to car with prosthesis   Lateral/Scoot Transfer Details (indicate cue type and reason)  verbal & tactile cues on engaging prosthesis in transfer    Comments  --      Ambulation/Gait   Ambulation/Gait  Yes    Ambulation/Gait Assistance  2: Max assist   2 people for safety & wife following w/c   Ambulation/Gait Assistance Details  visual, demo, tactile & verbal cues  on step width (not adducting sound limb & not abducting prosthesis), sequence, wt shift & upright posture    Ambulation Distance (Feet)  5 Feet   5' X 2   Assistive device  Prosthesis;Rolling walker    Gait Pattern  Step-to pattern;Decreased step length - left;Decreased stance time - right;Decreased stride length;Decreased hip/knee flexion - right;Decreased hip/knee flexion - left;Decreased weight shift to right;Right hip hike;Right foot flat;Right flexed knee in stance;Left flexed knee in stance;Antalgic;Lateral hip instability;Trunk flexed;Poor foot clearance - right  Ambulation Surface  Level;Indoor      Neuro Re-ed    Neuro Re-ed Details   standing with RW support with prosthesis 30 seconds 2 sets      Exercises   Exercises  Knee/Hip      Knee/Hip Exercises: Aerobic   Nustep  Level 5 with BUEs & BLEs 5 minutes  HR 77 SpO2 99% 2nd set 100% & 69 HR      Shoulder Exercises: Seated   Other Seated Exercises  --    Other Seated Exercises  --      Prosthetics   Prosthetic Care Comments   donning socks by placing index fingers in hole.      Current prosthetic wear tolerance (days/week)   daily     Current prosthetic wear tolerance (#hours/day)   4-5 hrs 1x/day    Current prosthetic weight-bearing tolerance (hours/day)   no pain reported with today's attempted weight bearing activities     Residual limb condition   1-67mm scab on incision with no drainage, 2 healing small bruises distal knee,      Education Provided  Skin check;Residual limb care;Proper Donning;Proper Doffing;Proper wear schedule/adjustment;Other (comment)   see prosthetic care comments   Person(s) Educated  Patient;Spouse    Education Method  Explanation;Demonstration;Tactile cues;Verbal cues    Education Method  Verbalized understanding;Returned demonstration;Tactile cues required;Verbal cues required;Needs further instruction    Donning Prosthesis  Minimal assist    Doffing Prosthesis  Minimal assist                PT Short Term Goals - 06/11/19 1942      PT SHORT TERM GOAL #1   Title  Patient & wife demonstrate understanding of updated HEP. (All [facility] STGs: 07/09/2019)    Time  1    Period  Months    Status  Revised    Target Date  07/09/19      PT SHORT TERM GOAL #2   Title  Sit to/from stand at sink with minA / wife able to assist and stand for 3 minutes with min guard.    Time  1    Period  Months    Status  Revised    Target Date  07/09/19      PT SHORT TERM GOAL #3   Title  Wife & patient report adjusting ply socks with limb volume changes.    Time  1    Period  Months    Status  Revised    Target Date  07/09/19      PT SHORT TERM GOAL #4   Title  Patient tolerates prosthesis wear >6 hours total /day without increase in wound issues.    Time  1    Period  Months    Status  New    Target Date  07/09/19      PT SHORT TERM GOAL #5   Title  Patient ambulates 10' in parallel bars with modA.    Time  1    Period  Months    Status  New    Target Date  07/09/19        PT Long Term Goals - 05/18/19 1244      PT LONG TERM GOAL #1   Title  Patient and wife verbalize & demonstrate understanding of proper prosthetic care to enable safe utilization of prosthesis. (All LTGs [facility] Date: 11/03/2019)    Time  6    Period  Months    Status  On-going    Target Date  11/03/19      PT LONG TERM GOAL #2   Title  Patient tolerates wear of prosthesis >90% of awake hours without skin or residual limb pain issues to enable function during his day.    Time  6    Period  Months    Status  On-going    Target Date  11/03/19      PT LONG TERM GOAL #3   Title  Standing balance with RW support: maintains upright 2 minutes, reaches 5" anteriorly and scans environment with supervision.    Time  6    Period  Months    Status  On-going    Target Date  11/03/19      PT LONG TERM GOAL #4   Title  Patient ambulates 100' with RW & prosthesis with wife's assistance / supervision  and turns 180* to position to sit safely.    Time  6    Period  Months    Status  On-going    Target Date  11/03/19      PT LONG TERM GOAL #5   Title  Sit to/from stand and stand-pivot transfers with RW & prosthesis with supervision.    Time  6    Period  Months    Status  On-going    Target Date  11/03/19            Plan - 06/29/19 2221    Clinical Impression Statement  PT session focused on improving weight shift for transfers with & without sliding board.  PT also worked on sit to/from stand to [employer] and limited prosthetic gait.    Personal Factors and Comorbidities  Age;Comorbidity 3+;Fitness;Past/Current Experience;Time since onset of injury/illness/exacerbation    Comorbidities  Rt TTA, C2-5 Cervical Fusion 2014, Tachypnea, encephalopathy, RLE paralysis, TIA, A-Fib, CAD, CHF, CKD stage 3, Sz disorder, hx of vertigo, knee arthroplasty Rt X 2 & Lt X 1    Examination-Activity Limitations  Caring for Others;Locomotion Level;Reach Overhead;Squat;Stairs;Stand;Transfers    Examination-Participation Restrictions  Community Activity    Stability/Clinical Decision Making  Evolving/Moderate complexity    Rehab Potential  Good    PT Frequency  2x / week    PT Duration  Other (comment)   25 weeks (6 months)   PT Treatment/Interventions  ADLs/Self Care Home Management;DME Instruction;Gait training;Stair training;Functional mobility training;Therapeutic activities;Therapeutic exercise;Balance training;Neuromuscular re-education;Patient/family education;Prosthetic Training    PT Next Visit Plan  work towards updated STGs, check residual limb (2 new light "scratch" marks on proximal tibia?), update HEP with more hip strengthening to be performed in sitting/supine, continue with prosthetic training, standing & gait in parallel bars and/or at sink    Consulted and Agree with Plan of Care  Patient;Family member/caregiver    Family Member Consulted  wife, [name]       Patient will benefit  from skilled therapeutic intervention in order to improve the following deficits and impairments:  Abnormal gait, Cardiopulmonary status limiting activity, Decreased activity tolerance, Decreased balance, Decreased endurance, Decreased knowledge of use of DME, Decreased mobility, Decreased range of motion, Decreased skin integrity, Decreased strength, Difficulty walking, Increased edema, Impaired flexibility, Postural dysfunction, Prosthetic Dependency, Pain  Visit Diagnosis: Other abnormalities of gait and mobility  Unsteadiness on feet  Abnormal posture  Weakness generalized  Muscle weakness (generalized)  Stiffness of right knee, not elsewhere classified  Contracture of muscle, multiple sites  Cervicalgia  Stiffness of left knee, not elsewhere classified  Chronic midline  low back pain without sciatica     Problem List Patient Active Problem List   Diagnosis Date Noted  . Dehiscence of amputation stump ([place])   . Wound infection 05/09/2018  . AKI (acute kidney injury) ([place])   . S/P BKA (below knee amputation) unilateral, right ([place])   . Below-knee amputation with complication, initial encounter ([place])   . Tachypnea   . Post-operative pain   . Subacute osteomyelitis, right ankle and foot ([place])   . Paralysis of right lower extremity ([place])   . TIA (transient ischemic attack) 03/24/2018  . Encephalopathy 03/24/2018  . Cellulitis 03/15/2018  . Hypernatremia 03/15/2018  . Persistent atrial fibrillation ([place]) 11/10/2017  . Severe muscle deconditioning 11/10/2017  . Hemorrhage 10/22/2017  . CAD S/P percutaneous coronary angioplasty 10/22/2017  . Acute hypoxemic respiratory failure ([place]) 07/17/2017  . Aspiration syndrome, subsequent encounter 07/11/2017  . Aspiration pneumonia ([place]) 07/01/2017  . Acute encephalopathy 07/01/2017  . Thrombocytopenia ([place]) 07/01/2017  . Chronic diastolic (congestive) heart failure ([place]) 07/01/2017  . Anemia of chronic disease 07/01/2017  .  Troponin level elevated 07/01/2017  . Chronic atrial fibrillation 07/01/2017  . Goals of care, counseling/discussion   . Palliative care by specialist   . Acute metabolic encephalopathy [id number]  . CKD (chronic kidney disease) stage 3, GFR 30-59 ml/min   . CAP (community acquired pneumonia) 04/17/2017  . Hallux rigidus, right foot 03/10/2016  . Lumbosacral spondylosis without myelopathy 10/29/2015  . Memory difficulty 09/22/2015  . Abnormality of gait 09/22/2015  . Hyperglycemia 07/06/2015  . Chronic pain 07/06/2015  . Chronic insomnia 03/30/2015  . Transient alteration of awareness 03/30/2015  . Abnormal liver function   . Altered mental status 01/31/2015  . Essential hypertension 01/31/2015  . Constipation 01/31/2015  . Hypothyroidism 01/31/2015  . Seizure disorder ([place]) 01/31/2015  . Bladder outlet obstruction 01/31/2015  . GERD (gastroesophageal reflux disease) 01/31/2015  . Chronic back pain 01/31/2015  . Acute kidney injury ([place]) 01/31/2015    Jamey Reas PT, DPT 06/29/2019, 10:23 PM  [facility] [address] [place], [place], [address] Phone: [phone]   Fax:  [phone]  Name: [name] MRN: [id number] Date of Birth: 1933/10/05

## 2019-07-02 ENCOUNTER — Encounter: Payer: Self-pay | Admitting: Physical Therapy

## 2019-07-02 ENCOUNTER — Ambulatory Visit (INDEPENDENT_AMBULATORY_CARE_PROVIDER_SITE_OTHER): Payer: Medicare Other | Admitting: Physical Therapy

## 2019-07-02 ENCOUNTER — Other Ambulatory Visit: Payer: Self-pay

## 2019-07-02 DIAGNOSIS — R2689 Other abnormalities of gait and mobility: Secondary | ICD-10-CM | POA: Diagnosis not present

## 2019-07-02 DIAGNOSIS — R531 Weakness: Secondary | ICD-10-CM

## 2019-07-02 DIAGNOSIS — R2681 Unsteadiness on feet: Secondary | ICD-10-CM

## 2019-07-02 DIAGNOSIS — M25661 Stiffness of right knee, not elsewhere classified: Secondary | ICD-10-CM

## 2019-07-02 DIAGNOSIS — M6249 Contracture of muscle, multiple sites: Secondary | ICD-10-CM

## 2019-07-02 DIAGNOSIS — M25662 Stiffness of left knee, not elsewhere classified: Secondary | ICD-10-CM

## 2019-07-02 DIAGNOSIS — R293 Abnormal posture: Secondary | ICD-10-CM

## 2019-07-02 DIAGNOSIS — Z9181 History of falling: Secondary | ICD-10-CM

## 2019-07-02 DIAGNOSIS — M542 Cervicalgia: Secondary | ICD-10-CM

## 2019-07-02 DIAGNOSIS — M6281 Muscle weakness (generalized): Secondary | ICD-10-CM

## 2019-07-02 NOTE — Therapy (Signed)
[facility] Physical Therapy [address] [place], [place], [address] Phone: [phone]   Fax:  [phone]  Physical Therapy Treatment  Patient Details  Name: [name] MRN: [id number] Date of Birth: [birth year] Referring Provider (PT): Bevely Palmer Persons, [place]   Encounter Date: 07/02/2019  PT End of Session - 07/02/19 2055    Visit Number  13    Number of Visits  50    Date for PT Re-Evaluation  08/06/19    Authorization Type  BCBS & Medicare    PT Start Time  0935    PT Stop Time  1015    PT Time Calculation (min)  40 min    Equipment Utilized During Treatment  Gait belt    Activity Tolerance  Patient tolerated treatment well;Patient limited by fatigue    Behavior During Therapy  [facility] for tasks assessed/performed       Past Medical History:  Diagnosis Date  . Abnormality of gait 09/22/2015  . Arthritis   . Atrial fibrillation ([place])   . CAD (coronary artery disease)    Stent to RCA, Penta stent, 99% reduced to 0% 2002.  . Cancer ([place])    skin CA removed from back  . Chronic insomnia 03/30/2015  . Complication of anesthesia    trouble waking up  . GERD (gastroesophageal reflux disease)   . Hypercholesteremia   . Hypertension   . Hypothyroidism   . Memory difficulty 09/22/2015  . Osteoarthritis   . Pneumonia   . Seizures ([place])   . Sepsis ([place]) 05/2017  . Transient alteration of awareness 03/30/2015  . Vertigo    hx of    Past Surgical History:  Procedure Laterality Date  . AMPUTATION Right 03/28/2018   Procedure: AMPUTATION BELOW KNEE;  Surgeon: Newt Minion, MD;  Location: [place];  Service: Orthopedics;  Laterality: Right;  . APPLICATION OF WOUND VAC Right 01/23/2019   Procedure: Application Of  Prevena Wound Vac;  Surgeon: Newt Minion, MD;  Location: [place];  Service: Orthopedics;  Laterality: Right;  . BACK SURGERY    . EYE SURGERY     Bilateral Cataract surgery   . HERNIA REPAIR    . I & D EXTREMITY Right 05/10/2015   Procedure:  IRRIGATION AND DEBRIDEMENT EXTREMITY;  Surgeon: Leanora Cover, MD;  Location: [place];  Service: Orthopedics;  Laterality: Right;  . KNEE ARTHROPLASTY     right knee X 2; left knee once  . LAMINECTOMY     X 6  . LEG AMPUTATION BELOW KNEE Right 03/28/2018  . POSTERIOR CERVICAL FUSION/FORAMINOTOMY  01/28/2012   Procedure: POSTERIOR CERVICAL FUSION/FORAMINOTOMY LEVEL 3;  Surgeon: [name], MD;  Location: [place] [facility];  Service: Neurosurgery;  Laterality: Left;  Posterior Cervical Five-Thoracic One Fusion, Arthrodesis with LEFT Cervical Seven-thoracic One Laminectomy, Foraminotomy and Resection of Synovial Cyst  . POSTERIOR CERVICAL FUSION/FORAMINOTOMY N/A 01/29/2013   Procedure: POSTERIOR CERVICAL FUSION/FORAMINOTOMY LEVEL 1 and C2-5 Posteriolateral Arthrodesis;  Surgeon: [name], MD;  Location: [place] [facility];  Service: Neurosurgery;  Laterality: N/A;  C2-C3 Laminectomy C2-C3 posterior cervical arthrodesis  . STUMP REVISION Right 01/23/2019   Procedure: REVISION RIGHT BELOW KNEE AMPUTATION;  Surgeon: Newt Minion, MD;  Location: [place];  Service: Orthopedics;  Laterality: Right;  . TONSILLECTOMY      There were no vitals filed for this visit.  Subjective Assessment - 07/02/19 0935    Subjective  He did not wear prosthesis yesterday due to headache.  Patient is accompained by:  Family member   wife - [name]   Pertinent History  Rt TTA, C2-5 Cervical Fusion 2014, Tachypnea, encephalopathy, RLE paralysis, TIA, A-Fib, CAD, CHF, CKD stage 3, Sz disorder, hx of vertigo, knee arthroplasty Rt X 2 & Lt X 1    Limitations  Lifting;Standing;Walking;House hold activities    Patient Stated Goals  To get prosthesis, go to bathroom alone, get around house, get out in community.    Currently in Pain?  Yes    Pain Score  1    was 3/10 prior to madication   Pain Location  Shoulder    Pain Orientation  Left    Pain Descriptors / Indicators  Aching;Sore    Pain Type   Chronic pain    Pain Onset  More than a month ago    Pain Frequency  Constant    Pain Onset  More than a month ago                       [facility] Adult PT Treatment/Exercise - 07/02/19 0935      Transfers   Transfers  Sit to Stand;Stand to Sit;Anterior-Posterior Transfer;Lateral/Scoot Transfers    Sit to Stand  2: Max assist;With upper extremity assist;With armrests;From chair/3-in-1;Other (comment)   To RW   Sit to Stand Details  Tactile cues for weight shifting;Visual cues for safe use of DME/AE;Verbal cues for technique;Verbal cues for sequencing;Verbal cues for safe use of DME/AE    Stand to Sit  3: Mod assist;With upper extremity assist;With armrests;To chair/3-in-1;Other (comment)   from RW   Stand to Sit Details (indicate cue type and reason)  Tactile cues for weight shifting;Visual cues for safe use of DME/AE;Verbal cues for technique;Verbal cues for sequencing;Verbal cues for safe use of DME/AE    Squat Pivot Transfers  --    Lateral/Scoot Transfers  --      Ambulation/Gait   Ambulation/Gait  Yes    Ambulation/Gait Assistance  2: Max assist   2 people for safety & wife following w/c   Ambulation/Gait Assistance Details  Manual assist for RLE movement without adduction, hip/knee extension with wt shift over prosthesis, strong verbal cues for sequence.     Ambulation Distance (Feet)  5 Feet   5' X 2   Assistive device  Prosthesis;Rolling walker    Gait Pattern  Step-to pattern;Decreased step length - left;Decreased stance time - right;Decreased stride length;Decreased hip/knee flexion - right;Decreased hip/knee flexion - left;Decreased weight shift to right;Right hip hike;Right foot flat;Right flexed knee in stance;Left flexed knee in stance;Antalgic;Lateral hip instability;Trunk flexed;Poor foot clearance - right    Ambulation Surface  Level;Indoor      Neuro Re-ed    Neuro Re-ed Details   standing with RW support with prosthesis 30 seconds 2 sets      Exercises    Exercises  --      Knee/Hip Exercises: Aerobic   Nustep  Level 5 with BUEs & BLEs 5 minutes  HR 77 SpO2 99% 2nd set 100% & 69 HR      Prosthetics   Prosthetic Care Comments   Fall risk for activities / transfers without prosthesis.  PT recommending wearing prosthesis even on days not feeling well. On those days activity level would be decreased not wear.  PT reviewed wearing prosthesis 2x/day to increase total time but enable his skin to not be over moist.     Current prosthetic wear tolerance (days/week)   daily  Current prosthetic wear tolerance (#hours/day)   4-5 hrs 1x/day / PT recommending 4hrs 2x/day starting upon arising.     Current prosthetic weight-bearing tolerance (hours/day)   no pain reported with today's attempted weight bearing activities     Residual limb condition   1 mm scab on incision with no drainage, 2 healing small bruises distal knee,      Education Provided  Skin check;Residual limb care;Proper Donning;Proper Doffing;Proper wear schedule/adjustment;Other (comment)   see prosthetic care comments   Person(s) Educated  Patient;Spouse    Education Method  Explanation;Demonstration;Tactile cues;Verbal cues    Education Method  Verbalized understanding;Needs further instruction    Donning Prosthesis  Minimal assist   Increased time   Doffing Prosthesis  Minimal assist               PT Short Term Goals - 06/11/19 1942      PT SHORT TERM GOAL #1   Title  Patient & wife demonstrate understanding of updated HEP. (All [facility] STGs: 07/09/2019)    Time  1    Period  Months    Status  Revised    Target Date  07/09/19      PT SHORT TERM GOAL #2   Title  Sit to/from stand at sink with minA / wife able to assist and stand for 3 minutes with min guard.    Time  1    Period  Months    Status  Revised    Target Date  07/09/19      PT SHORT TERM GOAL #3   Title  Wife & patient report adjusting ply socks with limb volume changes.    Time  1    Period  Months     Status  Revised    Target Date  07/09/19      PT SHORT TERM GOAL #4   Title  Patient tolerates prosthesis wear >6 hours total /day without increase in wound issues.    Time  1    Period  Months    Status  New    Target Date  07/09/19      PT SHORT TERM GOAL #5   Title  Patient ambulates 10' in parallel bars with modA.    Time  1    Period  Months    Status  New    Target Date  07/09/19        PT Long Term Goals - 05/18/19 1244      PT LONG TERM GOAL #1   Title  Patient and wife verbalize & demonstrate understanding of proper prosthetic care to enable safe utilization of prosthesis. (All LTGs Target Date: 11/03/2019)    Time  6    Period  Months    Status  On-going    Target Date  11/03/19      PT LONG TERM GOAL #2   Title  Patient tolerates wear of prosthesis >90% of awake hours without skin or residual limb pain issues to enable function during his day.    Time  6    Period  Months    Status  On-going    Target Date  11/03/19      PT LONG TERM GOAL #3   Title  Standing balance with RW support: maintains upright 2 minutes, reaches 5" anteriorly and scans environment with supervision.    Time  6    Period  Months    Status  On-going    Target Date  11/03/19      PT LONG TERM GOAL #4   Title  Patient ambulates 100' with RW & prosthesis with wife's assistance / supervision and turns 180* to position to sit safely.    Time  6    Period  Months    Status  On-going    Target Date  11/03/19      PT LONG TERM GOAL #5   Title  Sit to/from stand and stand-pivot transfers with RW & prosthesis with supervision.    Time  6    Period  Months    Status  On-going    Target Date  11/03/19            Plan - 07/02/19 2056    Clinical Impression Statement  Patient requires significant assist for standing & prosthetic gait due to severe weakness.  Patient becomes anxious with prosthetic gait and with HOH issues he does not follow directions fully.    Personal Factors  and Comorbidities  Age;Comorbidity 3+;Fitness;Past/Current Experience;Time since onset of injury/illness/exacerbation    Comorbidities  Rt TTA, C2-5 Cervical Fusion 2014, Tachypnea, encephalopathy, RLE paralysis, TIA, A-Fib, CAD, CHF, CKD stage 3, Sz disorder, hx of vertigo, knee arthroplasty Rt X 2 & Lt X 1    Examination-Activity Limitations  Caring for Others;Locomotion Level;Reach Overhead;Squat;Stairs;Stand;Transfers    Examination-Participation Restrictions  Community Activity    Stability/Clinical Decision Making  Evolving/Moderate complexity    Rehab Potential  Good    PT Frequency  2x / week    PT Duration  Other (comment)   25 weeks (6 months)   PT Treatment/Interventions  ADLs/Self Care Home Management;DME Instruction;Gait training;Stair training;Functional mobility training;Therapeutic activities;Therapeutic exercise;Balance training;Neuromuscular re-education;Patient/family education;Prosthetic Training    PT Next Visit Plan  check updated STGs, check residual limb (2 new light "scratch" marks on proximal tibia?), update HEP with more hip strengthening to be performed in sitting/supine, continue with prosthetic training, standing & gait in parallel bars and/or at sink    Consulted and Agree with Plan of Care  Patient;Family member/caregiver    Family Member Consulted  wife, Khaliel Lenn       Patient will benefit from skilled therapeutic intervention in order to improve the following deficits and impairments:  Abnormal gait, Cardiopulmonary status limiting activity, Decreased activity tolerance, Decreased balance, Decreased endurance, Decreased knowledge of use of DME, Decreased mobility, Decreased range of motion, Decreased skin integrity, Decreased strength, Difficulty walking, Increased edema, Impaired flexibility, Postural dysfunction, Prosthetic Dependency, Pain  Visit Diagnosis: Other abnormalities of gait and mobility  Unsteadiness on feet  Abnormal posture  Weakness  generalized  History of fall  Muscle weakness (generalized)  Stiffness of right knee, not elsewhere classified  Contracture of muscle, multiple sites  Cervicalgia  Stiffness of left knee, not elsewhere classified     Problem List Patient Active Problem List   Diagnosis Date Noted  . Dehiscence of amputation stump ([place])   . Wound infection 05/09/2018  . AKI (acute kidney injury) ([place])   . S/P BKA (below knee amputation) unilateral, right ([place])   . Below-knee amputation with complication, initial encounter ([place])   . Tachypnea   . Post-operative pain   . Subacute osteomyelitis, right ankle and foot ([place])   . Paralysis of right lower extremity ([place])   . TIA (transient ischemic attack) 03/24/2018  . Encephalopathy 03/24/2018  . Cellulitis 03/15/2018  . Hypernatremia 03/15/2018  . Persistent atrial fibrillation ([place]) 11/10/2017  . Severe muscle deconditioning 11/10/2017  . Hemorrhage 10/22/2017  .  CAD S/P percutaneous coronary angioplasty 10/22/2017  . Acute hypoxemic respiratory failure ([place]) 07/17/2017  . Aspiration syndrome, subsequent encounter 07/11/2017  . Aspiration pneumonia ([place]) 07/01/2017  . Acute encephalopathy 07/01/2017  . Thrombocytopenia ([place]) 07/01/2017  . Chronic diastolic (congestive) heart failure ([place]) 07/01/2017  . Anemia of chronic disease 07/01/2017  . Troponin level elevated 07/01/2017  . Chronic atrial fibrillation 07/01/2017  . Goals of care, counseling/discussion   . Palliative care by specialist   . Acute metabolic encephalopathy [id number]  . CKD (chronic kidney disease) stage 3, GFR 30-59 ml/min   . CAP (community acquired pneumonia) 04/17/2017  . Hallux rigidus, right foot 03/10/2016  . Lumbosacral spondylosis without myelopathy 10/29/2015  . Memory difficulty 09/22/2015  . Abnormality of gait 09/22/2015  . Hyperglycemia 07/06/2015  . Chronic pain 07/06/2015  . Chronic insomnia 03/30/2015  . Transient alteration of awareness  03/30/2015  . Abnormal liver function   . Altered mental status 01/31/2015  . Essential hypertension 01/31/2015  . Constipation 01/31/2015  . Hypothyroidism 01/31/2015  . Seizure disorder ([place]) 01/31/2015  . Bladder outlet obstruction 01/31/2015  . GERD (gastroesophageal reflux disease) 01/31/2015  . Chronic back pain 01/31/2015  . Acute kidney injury ([place]) 01/31/2015    Jamey Reas PT, DPT 07/02/2019, 8:58 PM  [facility] Physical Therapy [address] [place], [place], [address] Phone: [phone]   Fax:  [phone]  Name: YASHUA GANESAN MRN: [id number] Date of Birth: 15-Jun-1933

## 2019-07-06 ENCOUNTER — Ambulatory Visit: Payer: Medicare Other | Admitting: Physician Assistant

## 2019-07-07 ENCOUNTER — Ambulatory Visit (INDEPENDENT_AMBULATORY_CARE_PROVIDER_SITE_OTHER): Payer: Medicare Other | Admitting: Physical Therapy

## 2019-07-07 ENCOUNTER — Other Ambulatory Visit: Payer: Self-pay

## 2019-07-07 ENCOUNTER — Encounter: Payer: Self-pay | Admitting: Physical Therapy

## 2019-07-07 DIAGNOSIS — R2681 Unsteadiness on feet: Secondary | ICD-10-CM

## 2019-07-07 DIAGNOSIS — M25661 Stiffness of right knee, not elsewhere classified: Secondary | ICD-10-CM

## 2019-07-07 DIAGNOSIS — R2689 Other abnormalities of gait and mobility: Secondary | ICD-10-CM | POA: Diagnosis not present

## 2019-07-07 DIAGNOSIS — R531 Weakness: Secondary | ICD-10-CM | POA: Diagnosis not present

## 2019-07-07 DIAGNOSIS — R293 Abnormal posture: Secondary | ICD-10-CM

## 2019-07-07 DIAGNOSIS — M542 Cervicalgia: Secondary | ICD-10-CM

## 2019-07-07 DIAGNOSIS — M6249 Contracture of muscle, multiple sites: Secondary | ICD-10-CM

## 2019-07-07 DIAGNOSIS — M25662 Stiffness of left knee, not elsewhere classified: Secondary | ICD-10-CM

## 2019-07-08 NOTE — Therapy (Signed)
[facility] [address] [place], [place], [address] Phone: [phone]   Fax:  [phone]  Physical Therapy Treatment  Patient Details  Name: [name] MRN: [id number] Date of Birth: 07-28-1933 Referring Provider (PT): Bevely Palmer Persons, [place]   Encounter Date: 07/07/2019  PT End of Session - 07/07/19 1308    Visit Number  14    Number of Visits  50    Date for PT Re-Evaluation  08/06/19    Authorization Type  BCBS & Medicare    PT Start Time  1147    PT Stop Time  1240    PT Time Calculation (min)  53 min    Equipment Utilized During Treatment  Gait belt    Activity Tolerance  Patient tolerated treatment well;Patient limited by fatigue    Behavior During Therapy  [facility] for tasks assessed/performed       Past Medical History:  Diagnosis Date  . Abnormality of gait 09/22/2015  . Arthritis   . Atrial fibrillation ([place])   . CAD (coronary artery disease)    Stent to RCA, Penta stent, 99% reduced to 0% 2002.  . Cancer ([place])    skin CA removed from back  . Chronic insomnia 03/30/2015  . Complication of anesthesia    trouble waking up  . GERD (gastroesophageal reflux disease)   . Hypercholesteremia   . Hypertension   . Hypothyroidism   . Memory difficulty 09/22/2015  . Osteoarthritis   . Pneumonia   . Seizures ([place])   . Sepsis ([place]) 05/2017  . Transient alteration of awareness 03/30/2015  . Vertigo    hx of    Past Surgical History:  Procedure Laterality Date  . AMPUTATION Right 03/28/2018   Procedure: AMPUTATION BELOW KNEE;  Surgeon: Newt Minion, MD;  Location: [place];  Service: Orthopedics;  Laterality: Right;  . APPLICATION OF WOUND VAC Right 01/23/2019   Procedure: Application Of  Prevena Wound Vac;  Surgeon: Newt Minion, MD;  Location: [place];  Service: Orthopedics;  Laterality: Right;  . BACK SURGERY    . EYE SURGERY     Bilateral Cataract surgery   . HERNIA REPAIR    . I & D EXTREMITY Right 05/10/2015   Procedure:  IRRIGATION AND DEBRIDEMENT EXTREMITY;  Surgeon: Leanora Cover, MD;  Location: [place];  Service: Orthopedics;  Laterality: Right;  . KNEE ARTHROPLASTY     right knee X 2; left knee once  . LAMINECTOMY     X 6  . LEG AMPUTATION BELOW KNEE Right 03/28/2018  . POSTERIOR CERVICAL FUSION/FORAMINOTOMY  01/28/2012   Procedure: POSTERIOR CERVICAL FUSION/FORAMINOTOMY LEVEL 3;  Surgeon: [name], MD;  Location: [place] [facility];  Service: Neurosurgery;  Laterality: Left;  Posterior Cervical Five-Thoracic One Fusion, Arthrodesis with LEFT Cervical Seven-thoracic One Laminectomy, Foraminotomy and Resection of Synovial Cyst  . POSTERIOR CERVICAL FUSION/FORAMINOTOMY N/A 01/29/2013   Procedure: POSTERIOR CERVICAL FUSION/FORAMINOTOMY LEVEL 1 and C2-5 Posteriolateral Arthrodesis;  Surgeon: [name], MD;  Location: [place] [facility];  Service: Neurosurgery;  Laterality: N/A;  C2-C3 Laminectomy C2-C3 posterior cervical arthrodesis  . STUMP REVISION Right 01/23/2019   Procedure: REVISION RIGHT BELOW KNEE AMPUTATION;  Surgeon: Newt Minion, MD;  Location: [place];  Service: Orthopedics;  Laterality: Right;  . TONSILLECTOMY      There were no vitals filed for this visit.  Subjective Assessment - 07/07/19 1150    Subjective  He has worn prosthesis ~4hrs and 2x on most  days. The distal fibula area is sore    Patient is accompained by:  Family member   wife - [name]   Pertinent History  Rt TTA, C2-5 Cervical Fusion 2014, Tachypnea, encephalopathy, RLE paralysis, TIA, A-Fib, CAD, CHF, CKD stage 3, Sz disorder, hx of vertigo, knee arthroplasty Rt X 2 & Lt X 1    Limitations  Lifting;Standing;Walking;House hold activities    Patient Stated Goals  To get prosthesis, go to bathroom alone, get around house, get out in community.    Currently in Pain?  Yes    Pain Score  5     Pain Location  Shoulder    Pain Orientation  Left    Pain Descriptors / Indicators  Aching;Sore    Pain Type   Chronic pain;Other (Comment)   arthritis   Pain Onset  More than a month ago    Pain Frequency  Constant    Aggravating Factors   arthritis    Pain Relieving Factors  medication, pain patches    Pain Onset  More than a month ago                       [facility] Adult PT Treatment/Exercise - 07/07/19 1150      Transfers   Transfers  Sit to Stand;Stand to Sit;Anterior-Posterior Transfer;Lateral/Scoot Transfers;Stand Pivot Transfers    Sit to Stand  2: Max assist;With upper extremity assist;With armrests;From chair/3-in-1;Other (comment)   To RW   Sit to Stand Details  Tactile cues for weight shifting;Visual cues for safe use of DME/AE;Verbal cues for technique;Verbal cues for sequencing;Verbal cues for safe use of DME/AE    Stand to Sit  3: Mod assist;With upper extremity assist;With armrests;To chair/3-in-1;Other (comment)   from RW   Stand to Sit Details (indicate cue type and reason)  Tactile cues for weight shifting;Visual cues for safe use of DME/AE;Verbal cues for technique;Verbal cues for sequencing;Verbal cues for safe use of DME/AE    Stand Pivot Transfers  2: Max assist   RW & prosthesis w/c <> Nustep   Stand Pivot Transfer Details (indicate cue type and reason)  Manual /tactile & verbal cues on technique esp upright posture      Ambulation/Gait   Ambulation/Gait  --    Ambulation/Gait Assistance  --    Ambulation Distance (Feet)  --    Assistive device  --    Gait Pattern  --      Neuro Re-ed    Neuro Re-ed Details   seated exercises hip internal rotaton with knee flexed & supported so foot off floor - 5 reps 2 sets;    seated with RLE supported on chair & foot against door frame: leg press 5 reps 2 sets      Knee/Hip Exercises: Aerobic   Nustep  Level 5 with BUEs & BLEs 5 minutes  HR 77 SpO2 99% 2nd set 100% & 69 HR      Knee/Hip Exercises: Seated   Other Seated Knee/Hip Exercises  Internal & external rotation with knee straight & heel on floor 5 reps 2 sets     Other Seated Knee/Hip Exercises  Seated with knee straight reclined against w/c back:  straight leg raise ~2" in available AROM 5 reps 2 sets      Prosthetics   Prosthetic Care Comments   verbal & demo cues on RLE positioning with neutral rotation.  Scar mobiliizatin at distal fibula med/lat, prox/distal & circular. Wife return demo & verbalized  understanding.     Current prosthetic wear tolerance (days/week)   daily     Current prosthetic wear tolerance (#hours/day)   4-5 hrs 2x/day    Current prosthetic weight-bearing tolerance (hours/day)   no pain reported with today's attempted weight bearing activities     Residual limb condition   No open area noted on previously wound area, normal redness / pink in pressure tolerant areas of socket    Education Provided  Skin check;Residual limb care;Proper Donning;Proper Doffing;Proper wear schedule/adjustment;Other (comment)   see prosthetic care comments   Person(s) Educated  Patient;Spouse    Education Method  Explanation;Verbal cues;Demonstration;Tactile cues    Education Method  Verbalized understanding;Verbal cues required;Needs further instruction;Returned demonstration    Donning Prosthesis  Minimal assist    Doffing Prosthesis  Supervision               PT Short Term Goals - 07/08/19 0827      PT SHORT TERM GOAL #1   Title  Patient & wife demonstrate understanding of updated HEP. (All [facility] STGs: 07/09/2019)    Time  1    Period  Months    Status  On-going    Target Date  07/09/19      PT SHORT TERM GOAL #2   Title  Sit to/from stand at sink with minA / wife able to assist and stand for 3 minutes with min guard.    Time  1    Period  Months    Status  On-going    Target Date  07/09/19      PT SHORT TERM GOAL #3   Title  Wife & patient report adjusting ply socks with limb volume changes.    Time  1    Period  Months    Status  Partially Met    Target Date  07/09/19      PT SHORT TERM GOAL #4   Title  Patient  tolerates prosthesis wear >6 hours total /day without increase in wound issues.    Time  1    Period  Months    Status  Achieved    Target Date  07/09/19      PT SHORT TERM GOAL #5   Title  Patient ambulates 10' in parallel bars with modA.    Time  1    Period  Months    Status  On-going    Target Date  07/09/19        PT Long Term Goals - 05/18/19 1244      PT LONG TERM GOAL #1   Title  Patient and wife verbalize & demonstrate understanding of proper prosthetic care to enable safe utilization of prosthesis. (All LTGs [facility] Date: 11/03/2019)    Time  6    Period  Months    Status  On-going    Target Date  11/03/19      PT LONG TERM GOAL #2   Title  Patient tolerates wear of prosthesis >90% of awake hours without skin or residual limb pain issues to enable function during his day.    Time  6    Period  Months    Status  On-going    Target Date  11/03/19      PT LONG TERM GOAL #3   Title  Standing balance with RW support: maintains upright 2 minutes, reaches 5" anteriorly and scans environment with supervision.    Time  6    Period  Months  Status  On-going    Target Date  11/03/19      PT LONG TERM GOAL #4   Title  Patient ambulates 100' with RW & prosthesis with wife's assistance / supervision and turns 180* to position to sit safely.    Time  6    Period  Months    Status  On-going    Target Date  11/03/19      PT LONG TERM GOAL #5   Title  Sit to/from stand and stand-pivot transfers with RW & prosthesis with supervision.    Time  6    Period  Months    Status  On-going    Target Date  11/03/19            Plan - 07/07/19 1828    Clinical Impression Statement  PT began instruction in updated HEP seated. Pt & wife need further instruction to be able to safely perform at home.  He is tolerating increased prosthesis wear without skin or limb pain issues.    Personal Factors and Comorbidities  Age;Comorbidity 3+;Fitness;Past/Current Experience;Time since  onset of injury/illness/exacerbation    Comorbidities  Rt TTA, C2-5 Cervical Fusion 2014, Tachypnea, encephalopathy, RLE paralysis, TIA, A-Fib, CAD, CHF, CKD stage 3, Sz disorder, hx of vertigo, knee arthroplasty Rt X 2 & Lt X 1    Examination-Activity Limitations  Caring for Others;Locomotion Level;Reach Overhead;Squat;Stairs;Stand;Transfers    Examination-Participation Restrictions  Community Activity    Stability/Clinical Decision Making  Evolving/Moderate complexity    Rehab Potential  Good    PT Frequency  2x / week    PT Duration  Other (comment)   25 weeks (6 months)   PT Treatment/Interventions  ADLs/Self Care Home Management;DME Instruction;Gait training;Stair training;Functional mobility training;Therapeutic activities;Therapeutic exercise;Balance training;Neuromuscular re-education;Patient/family education;Prosthetic Training    PT Next Visit Plan  check updated STGs, check residual limb (2 new light "scratch" marks on proximal tibia?), update HEP with more hip strengthening to be performed in sitting/supine, continue with prosthetic training, standing & gait in parallel bars and/or at sink    Consulted and Agree with Plan of Care  Patient;Family member/caregiver    Family Member Consulted  wife, Arkin Imran       Patient will benefit from skilled therapeutic intervention in order to improve the following deficits and impairments:  Abnormal gait, Cardiopulmonary status limiting activity, Decreased activity tolerance, Decreased balance, Decreased endurance, Decreased knowledge of use of DME, Decreased mobility, Decreased range of motion, Decreased skin integrity, Decreased strength, Difficulty walking, Increased edema, Impaired flexibility, Postural dysfunction, Prosthetic Dependency, Pain  Visit Diagnosis: Other abnormalities of gait and mobility  Unsteadiness on feet  Abnormal posture  Weakness generalized  Stiffness of right knee, not elsewhere classified  Contracture of  muscle, multiple sites  Cervicalgia  Stiffness of left knee, not elsewhere classified     Problem List Patient Active Problem List   Diagnosis Date Noted  . Dehiscence of amputation stump ([place])   . Wound infection 05/09/2018  . AKI (acute kidney injury) ([place])   . S/P BKA (below knee amputation) unilateral, right ([place])   . Below-knee amputation with complication, initial encounter ([place])   . Tachypnea   . Post-operative pain   . Subacute osteomyelitis, right ankle and foot ([place])   . Paralysis of right lower extremity ([place])   . TIA (transient ischemic attack) 03/24/2018  . Encephalopathy 03/24/2018  . Cellulitis 03/15/2018  . Hypernatremia 03/15/2018  . Persistent atrial fibrillation ([place]) 11/10/2017  . Severe muscle deconditioning 11/10/2017  .  Hemorrhage 10/22/2017  . CAD S/P percutaneous coronary angioplasty 10/22/2017  . Acute hypoxemic respiratory failure ([place]) 07/17/2017  . Aspiration syndrome, subsequent encounter 07/11/2017  . Aspiration pneumonia ([place]) 07/01/2017  . Acute encephalopathy 07/01/2017  . Thrombocytopenia ([place]) 07/01/2017  . Chronic diastolic (congestive) heart failure ([place]) 07/01/2017  . Anemia of chronic disease 07/01/2017  . Troponin level elevated 07/01/2017  . Chronic atrial fibrillation 07/01/2017  . Goals of care, counseling/discussion   . Palliative care by specialist   . Acute metabolic encephalopathy 78/97/8478  . CKD (chronic kidney disease) stage 3, GFR 30-59 ml/min   . CAP (community acquired pneumonia) 04/17/2017  . Hallux rigidus, right foot 03/10/2016  . Lumbosacral spondylosis without myelopathy 10/29/2015  . Memory difficulty 09/22/2015  . Abnormality of gait 09/22/2015  . Hyperglycemia 07/06/2015  . Chronic pain 07/06/2015  . Chronic insomnia 03/30/2015  . Transient alteration of awareness 03/30/2015  . Abnormal liver function   . Altered mental status 01/31/2015  . Essential hypertension 01/31/2015  . Constipation 01/31/2015   . Hypothyroidism 01/31/2015  . Seizure disorder ([place]) 01/31/2015  . Bladder outlet obstruction 01/31/2015  . GERD (gastroesophageal reflux disease) 01/31/2015  . Chronic back pain 01/31/2015  . Acute kidney injury ([place]) 01/31/2015    Jamey Reas PT, DPT 07/08/2019, 8:30 AM  [facility] [address] [place], [place], [address] Phone: [phone]   Fax:  [phone]  Name: [name] MRN: [id number] Date of Birth: 13-Sep-1933

## 2019-07-09 ENCOUNTER — Encounter: Payer: Medicare Other | Admitting: Physical Therapy

## 2019-07-10 ENCOUNTER — Other Ambulatory Visit: Payer: Self-pay

## 2019-07-10 ENCOUNTER — Emergency Department (HOSPITAL_COMMUNITY): Payer: Medicare Other

## 2019-07-10 ENCOUNTER — Emergency Department (HOSPITAL_COMMUNITY)
Admission: EM | Admit: 2019-07-10 | Discharge: 2019-07-10 | Disposition: A | Discharge status: Home / Self Care | Payer: Medicare Other | Attending: Emergency Medicine | Admitting: Emergency Medicine

## 2019-07-10 ENCOUNTER — Encounter (HOSPITAL_COMMUNITY): Payer: Self-pay

## 2019-07-10 DIAGNOSIS — N50812 Left testicular pain: Secondary | ICD-10-CM | POA: Diagnosis present

## 2019-07-10 DIAGNOSIS — I5032 Chronic diastolic (congestive) heart failure: Secondary | ICD-10-CM | POA: Insufficient documentation

## 2019-07-10 DIAGNOSIS — R319 Hematuria, unspecified: Secondary | ICD-10-CM | POA: Insufficient documentation

## 2019-07-10 DIAGNOSIS — R350 Frequency of micturition: Secondary | ICD-10-CM | POA: Diagnosis not present

## 2019-07-10 DIAGNOSIS — Z79899 Other long term (current) drug therapy: Secondary | ICD-10-CM | POA: Diagnosis not present

## 2019-07-10 DIAGNOSIS — N183 Chronic kidney disease, stage 3 unspecified: Secondary | ICD-10-CM | POA: Insufficient documentation

## 2019-07-10 DIAGNOSIS — I130 Hypertensive heart and chronic kidney disease with heart failure and stage 1 through stage 4 chronic kidney disease, or unspecified chronic kidney disease: Secondary | ICD-10-CM | POA: Insufficient documentation

## 2019-07-10 DIAGNOSIS — R300 Dysuria: Secondary | ICD-10-CM | POA: Diagnosis not present

## 2019-07-10 LAB — CBC WITH DIFFERENTIAL/PLATELET
Abs Immature Granulocytes: 0.09 10*3/uL — ABNORMAL HIGH (ref 0.00–0.07)
Basophils Absolute: 0 10*3/uL (ref 0.0–0.1)
Basophils Relative: 0 %
Eosinophils Absolute: 0.1 10*3/uL (ref 0.0–0.5)
Eosinophils Relative: 1 %
HCT: 35.5 % — ABNORMAL LOW (ref 39.0–52.0)
Hemoglobin: 10.9 g/dL — ABNORMAL LOW (ref 13.0–17.0)
Immature Granulocytes: 1 %
Lymphocytes Relative: 13 %
Lymphs Abs: 1.1 10*3/uL (ref 0.7–4.0)
MCH: 31.1 pg (ref 26.0–34.0)
MCHC: 30.7 g/dL (ref 30.0–36.0)
MCV: 101.1 fL — ABNORMAL HIGH (ref 80.0–100.0)
Monocytes Absolute: 1 10*3/uL (ref 0.1–1.0)
Monocytes Relative: 11 %
Neutro Abs: 6.2 10*3/uL (ref 1.7–7.7)
Neutrophils Relative %: 74 %
Platelets: 117 10*3/uL — ABNORMAL LOW (ref 150–400)
RBC: 3.51 MIL/uL — ABNORMAL LOW (ref 4.22–5.81)
RDW: 15.7 % — ABNORMAL HIGH (ref 11.5–15.5)
WBC: 8.5 10*3/uL (ref 4.0–10.5)
nRBC: 0 % (ref 0.0–0.2)

## 2019-07-10 LAB — BASIC METABOLIC PANEL
Anion gap: 9 (ref 5–15)
BUN: 42 mg/dL — ABNORMAL HIGH (ref 8–23)
CO2: 27 mmol/L (ref 22–32)
Calcium: 8.6 mg/dL — ABNORMAL LOW (ref 8.9–10.3)
Chloride: 103 mmol/L (ref 98–111)
Creatinine, Ser: 1.73 mg/dL — ABNORMAL HIGH (ref 0.61–1.24)
GFR calc Af Amer: 41 mL/min — ABNORMAL LOW (ref >60)
GFR calc non Af Amer: 36 mL/min — ABNORMAL LOW (ref >60)
Glucose, Bld: 80 mg/dL (ref 70–99)
Potassium: 4.5 mmol/L (ref 3.5–5.1)
Sodium: 139 mmol/L (ref 135–145)

## 2019-07-10 LAB — URINALYSIS, ROUTINE W REFLEX MICROSCOPIC
Bilirubin Urine: NEGATIVE
Glucose, UA: NEGATIVE mg/dL
Ketones, ur: 5 mg/dL — AB
Nitrite: NEGATIVE
Protein, ur: 30 mg/dL — AB
RBC / HPF: >50 RBC/hpf — ABNORMAL HIGH (ref 0–5)
Specific Gravity, Urine: 1.015 (ref 1.005–1.030)
pH: 7 (ref 5.0–8.0)

## 2019-07-10 MED ORDER — HYDROCODONE-ACETAMINOPHEN 5-325 MG PO TABS
2.0000 | ORAL_TABLET | Freq: Once | ORAL | Status: AC
  Administered 2019-07-10: 2 via ORAL
  Filled 2019-07-10: qty 2

## 2019-07-10 MED ORDER — SULFAMETHOXAZOLE-TRIMETHOPRIM 800-160 MG PO TABS: 1.0000 | ORAL_TABLET | Freq: Two times a day (BID) | ORAL | 0 refills | Status: DC

## 2019-07-10 MED ORDER — SODIUM CHLORIDE 0.9 % IV SOLN
1.0000 g | Freq: Once | INTRAVENOUS | Status: AC
  Administered 2019-07-10: 08:00:00 1 g via INTRAVENOUS
  Filled 2019-07-10: qty 10

## 2019-07-10 MED ORDER — FLUCONAZOLE 150 MG PO TABS
150.0000 mg | ORAL_TABLET | Freq: Once | ORAL | Status: AC
  Administered 2019-07-10: 150 mg via ORAL
  Filled 2019-07-10: qty 1

## 2019-07-10 MED ORDER — SODIUM CHLORIDE 0.9 % IV BOLUS
500.0000 mL | Freq: Once | INTRAVENOUS | Status: AC
  Administered 2019-07-10: 500 mL via INTRAVENOUS

## 2019-07-10 NOTE — ED Provider Notes (Signed)
  Physical Exam  BP (!) 144/99   Pulse 80   Temp 98 F (36.7 C) (Oral)   Resp 18   Ht 5\' 9"  (1.753 m)   Wt 70.3 kg   SpO2 95%   BMI 22.89 kg/m   Physical Exam  ED Course/Procedures     Procedures  MDM  Received patient in signout.  Urine shows some blood but not clear infection.  Normal white count.  Testicular ultrasound normal.  Has had previous infections.  Given dose of Rocephin by prior provider.  However after discussion with patient we will hold off on discharge antibiotics.  Culture is pending and results of that can be used to determine further coverage if needed.  Well-appearing.  Discharge home.       Davonna Belling, MD 07/10/19 [phone]

## 2019-07-10 NOTE — Discharge Instructions (Addendum)
You will be notified if your culture comes back positive and you need antibiotics.You do not need to fill the antibiotics now.

## 2019-07-10 NOTE — ED Notes (Signed)
Pt aware of urine sample, Pt states he is unable to urinate at the moment. Call bell within reach for assistance to use the urinal.

## 2019-07-10 NOTE — ED Provider Notes (Signed)
[facility] DEPT Provider Note   CSN: [id number] Arrival date & time: 07/10/19  0505     History Chief Complaint  Patient presents with  . Dysuria    [name] is a 86 y.o. male.  Patient from home with a 1 day history of dysuria, frequency, urgency and discolored urine.  He is concerned he has another urinary tract infection because he has had one before.  He has had burning with urination with frequency, urgency and orange-colored urine since yesterday morning.  His wife called EMS this morning because his urine was dark in color.  He is also complaining of some intermittent pain in his left testicle which is an issue for him for a number of years.  Denies any recent fall or trauma.  Denies fevers, chills, nausea or vomiting.  No abdominal pain or back pain.  No chest pain or shortness of breath.  Denies any history of kidney stones.  The history is provided by the patient and the EMS personnel.  Dysuria Presenting symptoms: dysuria   Associated symptoms: hematuria and urinary frequency   Associated symptoms: no abdominal pain, no fever, no flank pain, no nausea and no vomiting        Past Medical History:  Diagnosis Date  . Abnormality of gait 09/22/2015  . Arthritis   . Atrial fibrillation ([place])   . CAD (coronary artery disease)    Stent to RCA, Penta stent, 99% reduced to 0% 2002.  . Cancer ([place])    skin CA removed from back  . Chronic insomnia 03/30/2015  . Complication of anesthesia    trouble waking up  . GERD (gastroesophageal reflux disease)   . Hypercholesteremia   . Hypertension   . Hypothyroidism   . Memory difficulty 09/22/2015  . Osteoarthritis   . Pneumonia   . Seizures ([place])   . Sepsis ([place]) 05/2017  . Transient alteration of awareness 03/30/2015  . Vertigo    hx of    Patient Active Problem List   Diagnosis Date Noted  . Dehiscence of amputation stump ([place])   . Wound infection 05/09/2018  . AKI (acute kidney  injury) ([place])   . S/P BKA (below knee amputation) unilateral, right ([place])   . Below-knee amputation with complication, initial encounter ([place])   . Tachypnea   . Post-operative pain   . Subacute osteomyelitis, right ankle and foot ([place])   . Paralysis of right lower extremity ([place])   . TIA (transient ischemic attack) 03/24/2018  . Encephalopathy 03/24/2018  . Cellulitis 03/15/2018  . Hypernatremia 03/15/2018  . Persistent atrial fibrillation ([place]) 11/10/2017  . Severe muscle deconditioning 11/10/2017  . Hemorrhage 10/22/2017  . CAD S/P percutaneous coronary angioplasty 10/22/2017  . Acute hypoxemic respiratory failure ([place]) 07/17/2017  . Aspiration syndrome, subsequent encounter 07/11/2017  . Aspiration pneumonia ([place]) 07/01/2017  . Acute encephalopathy 07/01/2017  . Thrombocytopenia ([place]) 07/01/2017  . Chronic diastolic (congestive) heart failure ([place]) 07/01/2017  . Anemia of chronic disease 07/01/2017  . Troponin level elevated 07/01/2017  . Chronic atrial fibrillation 07/01/2017  . Goals of care, counseling/discussion   . Palliative care by specialist   . Acute metabolic encephalopathy 123456  . CKD (chronic kidney disease) stage 3, GFR 30-59 ml/min   . CAP (community acquired pneumonia) 04/17/2017  . Hallux rigidus, right foot 03/10/2016  . Lumbosacral spondylosis without myelopathy 10/29/2015  . Memory difficulty 09/22/2015  . Abnormality of gait 09/22/2015  . Hyperglycemia 07/06/2015  . Chronic pain 07/06/2015  .  Chronic insomnia 03/30/2015  . Transient alteration of awareness 03/30/2015  . Abnormal liver function   . Altered mental status 01/31/2015  . Essential hypertension 01/31/2015  . Constipation 01/31/2015  . Hypothyroidism 01/31/2015  . Seizure disorder ([place]) 01/31/2015  . Bladder outlet obstruction 01/31/2015  . GERD (gastroesophageal reflux disease) 01/31/2015  . Chronic back pain 01/31/2015  . Acute kidney injury ([place]) 01/31/2015    Past Surgical  History:  Procedure Laterality Date  . AMPUTATION Right 03/28/2018   Procedure: AMPUTATION BELOW KNEE;  Surgeon: Newt Minion, MD;  Location: [facility];  Service: Orthopedics;  Laterality: Right;  . APPLICATION OF WOUND VAC Right 01/23/2019   Procedure: Application Of  Prevena Wound Vac;  Surgeon: Newt Minion, MD;  Location: [facility];  Service: Orthopedics;  Laterality: Right;  . BACK SURGERY    . EYE SURGERY     Bilateral Cataract surgery   . HERNIA REPAIR    . I & D EXTREMITY Right 05/10/2015   Procedure: IRRIGATION AND DEBRIDEMENT EXTREMITY;  Surgeon: Leanora Cover, MD;  Location: [facility];  Service: Orthopedics;  Laterality: Right;  . KNEE ARTHROPLASTY     right knee X 2; left knee once  . LAMINECTOMY     X 6  . LEG AMPUTATION BELOW KNEE Right 03/28/2018  . POSTERIOR CERVICAL FUSION/FORAMINOTOMY  01/28/2012   Procedure: POSTERIOR CERVICAL FUSION/FORAMINOTOMY LEVEL 3;  Surgeon: [name], MD;  Location: [facility];  Service: Neurosurgery;  Laterality: Left;  Posterior Cervical Five-Thoracic One Fusion, Arthrodesis with LEFT Cervical Seven-thoracic One Laminectomy, Foraminotomy and Resection of Synovial Cyst  . POSTERIOR CERVICAL FUSION/FORAMINOTOMY N/A 01/29/2013   Procedure: POSTERIOR CERVICAL FUSION/FORAMINOTOMY LEVEL 1 and C2-5 Posteriolateral Arthrodesis;  Surgeon: [name], MD;  Location: [facility];  Service: Neurosurgery;  Laterality: N/A;  C2-C3 Laminectomy C2-C3 posterior cervical arthrodesis  . STUMP REVISION Right 01/23/2019   Procedure: REVISION RIGHT BELOW KNEE AMPUTATION;  Surgeon: Newt Minion, MD;  Location: [facility];  Service: Orthopedics;  Laterality: Right;  . TONSILLECTOMY         Family History  Problem Relation Age of Onset  . Hypertension Mother   . Cancer Mother   . Kidney failure Father   . Heart disease Father     Social History   Tobacco Use  . Smoking status: Former Research scientist (life sciences)  . Smokeless tobacco: Never Used    Substance Use Topics  . Alcohol use: Not Currently    Comment: rare  . Drug use: No    Home Medications Prior to Admission medications   Medication Sig Start Date End Date Taking? Authorizing Provider  alendronate (FOSAMAX) 70 MG tablet Take 70 mg by mouth every Wednesday. Remain upright for 30-60 minutes. 01/06/12   [provider]  Amino Acids-Protein Hydrolys (FEEDING SUPPLEMENT, PRO-STAT SUGAR FREE 64,) LIQD Take 30 mLs by mouth 2 (two) times daily.     [provider]  aspirin EC 81 MG tablet Take 81 mg by mouth daily.    [provider]  Calcium Carb-Cholecalciferol (CALCIUM 600+D) 600-800 MG-UNIT TABS Take 1 tablet by mouth daily.    [provider]  Cholecalciferol (VITAMIN D) 125 MCG (5000 UT) CAPS Take 5,000 Units by mouth daily.     [provider]  cyanocobalamin 500 MCG tablet Take 500 mcg by mouth at bedtime. Vitamin B12    [provider]  diclofenac sodium (VOLTAREN) 1 % GEL Apply 2 g topically daily as needed (pain).  [provider]  diltiazem (CARDIZEM CD) 240 MG 24 hr capsule TAKE 1 CAPSULE BY MOUTH EVERY MORNING. HOLD IF SYSTOLIC BLOOD PRESSURE IS LESS THAN 100 06/11/19   Croitoru, Mihai, MD  divalproex (DEPAKOTE ER) 250 MG 24 hr tablet Take 250-500 mg by mouth See admin instructions. Take 250 mg in the morning and 500 mg in the evening    [provider]  doxazosin (CARDURA) 4 MG tablet Take 1 tablet (4 mg total) by mouth at bedtime. 06/26/19   Croitoru, Mihai, MD  furosemide (LASIX) 20 MG tablet Take 1 tablet (20 mg total) by mouth daily. 07/24/18 01/21/19  Croitoru, Mihai, MD  levothyroxine (SYNTHROID, LEVOTHROID) 150 MCG tablet Take 150 mcg by mouth daily before breakfast. 12/27/11   [provider]  lidocaine (LIDODERM) 5 % Place 1 patch onto the skin daily as needed (pain). Remove & Discard patch within 12 hours or as directed by MD    [provider]  metoprolol tartrate  (LOPRESSOR) 25 MG tablet TAKE 1 TABLET BY MOUTH TWICE DAILY WITH A MEAL OR IMMEDIATELY AFTER Patient taking differently: Take 25 mg by mouth 2 (two) times daily with a meal.  11/10/18   Croitoru, Mihai, MD  Multiple Vitamins-Minerals (CERTAVITE/ANTIOXIDANTS) TABS Take 1 tablet by mouth every evening.     [provider]  mupirocin ointment (BACTROBAN) 2 % Apply 1 application topically daily. Apply to right lateral leg wound daily Patient taking differently: Apply 1 application topically daily as needed (wound care). Apply to right lateral leg wound daily 09/11/18   Rayburn, Neta Mends, PA-C  nitroGLYCERIN (NITROSTAT) 0.4 MG SL tablet Place 1 tablet (0.4 mg total) under the tongue every 5 (five) minutes as needed for chest pain (call 911 afer 3 doses and if chest pain persists). 06/19/18   Croitoru, Mihai, MD  omeprazole (PRILOSEC) 20 MG capsule Take 20 mg by mouth daily before breakfast.  12/23/11   [provider]  oxyCODONE-acetaminophen (PERCOCET) 5-325 MG tablet Take 1 tablet by mouth every 4 (four) hours as needed for severe pain. 01/23/19 01/23/20  Persons, Bevely Palmer, PA  oxymetazoline (AFRIN) 0.05 % nasal spray Place 1 spray into both nostrils 2 (two) times daily as needed for congestion.    [provider]  potassium chloride SA (K-DUR) 20 MEQ tablet Take 10 mEq by mouth daily.  06/26/18   Croitoru, Mihai, MD  pravastatin (PRAVACHOL) 40 MG tablet Take 40 mg by mouth every evening.  10/29/14   [provider]  predniSONE (DELTASONE) 10 MG tablet Take 10 mg by mouth daily with breakfast.    [provider]  trimethoprim (TRIMPEX) 100 MG tablet Take 100 mg by mouth daily.    [provider]    Allergies    Eliquis [apixaban], Demerol [meperidine], Keppra [levetiracetam], and Vimpat [lacosamide]  Review of Systems   Review of Systems  Constitutional: Negative for activity change, appetite change and fever.  HENT: Negative for congestion  and rhinorrhea.   Respiratory: Negative for cough, chest tightness and shortness of breath.   Cardiovascular: Negative for chest pain.  Gastrointestinal: Negative for abdominal pain, nausea and vomiting.  Genitourinary: Positive for dysuria, frequency, hematuria, testicular pain and urgency. Negative for flank pain.  Musculoskeletal: Negative for arthralgias and myalgias.  Neurological: Negative for weakness, light-headedness and headaches.   all other systems are negative except as noted in the HPI and PMH.    Physical Exam Updated Vital Signs BP (!) 150/85 (BP Location: Left Arm)  Pulse 83   Temp 98 F (36.7 C) (Oral)   Resp 16   Ht 5\' 9"  (1.753 m)   Wt 70.3 kg   SpO2 97%   BMI 22.89 kg/m   Physical Exam Vitals and nursing note reviewed.  Constitutional:      General: He is not in acute distress.    Appearance: He is well-developed.  HENT:     Head: Normocephalic and atraumatic.     Mouth/Throat:     Pharynx: No oropharyngeal exudate.  Eyes:     Conjunctiva/sclera: Conjunctivae normal.     Pupils: Pupils are equal, round, and reactive to light.  Neck:     Comments: No meningismus. Cardiovascular:     Rate and Rhythm: Normal rate and regular rhythm.     Heart sounds: Normal heart sounds. No murmur.  Pulmonary:     Effort: Pulmonary effort is normal. No respiratory distress.     Breath sounds: Normal breath sounds.  Abdominal:     Palpations: Abdomen is soft.     Tenderness: There is no abdominal tenderness. There is no guarding or rebound.     Comments: Soft abdomen, nontender  Genitourinary:    Comments: Tenderness to palpation of left testicle.  There is no asymmetry or swelling. Musculoskeletal:        General: No tenderness. Normal range of motion.     Cervical back: Normal range of motion and neck supple.     Comments: Right BKA  No CVA tenderness  Skin:    General: Skin is warm.  Neurological:     Mental Status: He is alert and oriented to person,  place, and time.     Cranial Nerves: No cranial nerve deficit.     Motor: No abnormal muscle tone.     Coordination: Coordination normal.     Comments: No ataxia on finger to nose bilaterally. No pronator drift. 5/5 strength throughout. CN 2-12 intact.Equal grip strength. Sensation intact.   Psychiatric:        Behavior: Behavior normal.     ED Results / Procedures / Treatments   Labs (all labs ordered are listed, but only abnormal results are displayed) Labs Reviewed  URINALYSIS, ROUTINE W REFLEX MICROSCOPIC - Abnormal; Notable for the following components:      Result Value   Hgb urine dipstick LARGE (*)    Ketones, ur 5 (*)    Protein, ur 30 (*)    Leukocytes,Ua SMALL (*)    RBC / HPF >50 (*)    Bacteria, UA FEW (*)    All other components within normal limits  CBC WITH DIFFERENTIAL/PLATELET - Abnormal; Notable for the following components:   RBC 3.51 (*)    Hemoglobin 10.9 (*)    HCT 35.5 (*)    MCV 101.1 (*)    RDW 15.7 (*)    All other components within normal limits  BASIC METABOLIC PANEL - Abnormal; Notable for the following components:   BUN 42 (*)    Creatinine, Ser 1.73 (*)    Calcium 8.6 (*)    GFR calc non Af Amer 36 (*)    GFR calc Af Amer 41 (*)    All other components within normal limits  URINE CULTURE    EKG None  Radiology No results found.  Procedures Procedures (including critical care time)  Medications Ordered in ED Medications - No data to display  ED Course  I have reviewed the triage vital signs and the nursing notes.  Pertinent labs & imaging results that were available during my care of the patient were reviewed by me and considered in my medical decision making (see chart for details).  October 2020 Escherichia coli      MIC    AMPICILLIN >=32 RESIST... Resistant    AMPICILLIN/SULBACTAM >=32 RESIST... Resistant    CEFAZOLIN >=64 RESIST... Resistant    CEFTRIAXONE <=1 SENSITIVE  Sensitive    CIPROFLOXACIN <=0.25 SENS...  Sensitive    Extended ESBL NEGATIVE  Sensitive    GENTAMICIN <=1 SENSITIVE  Sensitive    IMIPENEM <=0.25 SENS... Sensitive    NITROFURANTOIN <=16 SENSIT... Sensitive    PIP/TAZO 64 INTERMED... Intermediate    TRIMETH/SULFA <=20 SENSIT... Sensitive              MDM Rules/Calculators/A&P                      Patient from home with concern for UTI.  He appears stable with no fever and no systemic symptoms.  Pt bladder scan shows 124 ml.  Urinalysis here shows hematuria without evidence of obvious infection.  Will send culture.  Does show yeast.  Last urine culture as above grew E. coli with multiple resistances.  Patient appears well and nontoxic.  Awaiting labs and urinalysis.  We will also obtain testicular ultrasound given his testicular pain on exam.  He does not appear to be toxic or septic.  We will treat for suspected UTI with antibiotics.  Culture will be sent. Awaiting labs.  Care to be transferred at shift change. Final Clinical Impression(s) / ED Diagnoses Final diagnoses:  Left testicular pain    Rx / DC Orders ED Discharge Orders    None       Pailynn Vahey, Annie Main, MD 07/10/19 [phone]

## 2019-07-10 NOTE — ED Triage Notes (Signed)
Frequent UTI's. Sts burning when urinating.

## 2019-07-10 NOTE — ED Notes (Signed)
Pt bladder scan shows 124 ml.

## 2019-07-12 LAB — URINE CULTURE: Culture: >=100000 — AB

## 2019-07-13 ENCOUNTER — Encounter: Payer: Self-pay | Admitting: Physician Assistant

## 2019-07-13 ENCOUNTER — Ambulatory Visit (INDEPENDENT_AMBULATORY_CARE_PROVIDER_SITE_OTHER): Payer: Medicare Other | Admitting: Physician Assistant

## 2019-07-13 ENCOUNTER — Telehealth: Payer: Self-pay | Admitting: *Deleted

## 2019-07-13 ENCOUNTER — Other Ambulatory Visit: Payer: Self-pay

## 2019-07-13 DIAGNOSIS — Z89511 Acquired absence of right leg below knee: Secondary | ICD-10-CM

## 2019-07-13 NOTE — Progress Notes (Signed)
Office Visit Note   Patient: [name]           Date of Birth: August 20, 1933           MRN: [id number] Visit Date: 07/13/2019              Requested by: [name], [address],  [place] [id number] PCP: [name], MD  Chief Complaint  Patient presents with  . Right Knee - Pain      HPI: This is a pleasant gentleman who is accompanied by his wife.  He is 5 months status post right below-knee amputation revision.  He has received his prosthetic and is working with Shirlean Mylar and physical therapy.  He has not really ambulated on the prosthetic yet but is working towards this goal with PT.    Assessment & Plan: Visit Diagnoses: No diagnosis found.  Plan: He will continue with physical therapy for mobilization.  I reassured him that his rate of progress is per him.  He has some hip issues that are addressed by a different physician and he is scheduled to get an injection.  He should progress with physical therapy follow-up with [place] in about 3 months  Follow-Up Instructions: No follow-ups on file.   Ortho Exam  Patient is alert, oriented, no adenopathy, well-dressed, normal affect, normal respiratory effort. Examination of the BKA stump demonstrates well-healed surgical incision.  Minimal to no soft tissue swelling.  Range of motion of the knee is quite good.  No cellulitis no drainage  Imaging: No results found. No images are attached to the encounter.  Labs: Lab Results  Component Value Date   HGBA1C 5.7 (H) 03/26/2018   HGBA1C 5.0 07/12/2017   HGBA1C 5.8 (H) 07/06/2015   ESRSEDRATE 24 (H) 03/24/2018   ESRSEDRATE 15 04/18/2017   ESRSEDRATE 5 09/17/2007   CRP 3.1 (H) 05/11/2018   CRP 1.8 (H) 03/24/2018   CRP <0.8 04/24/2017   LABURIC 6.0 03/09/2016   REPTSTATUS 07/12/2019 FINAL 07/10/2019   GRAMSTAIN  07/15/2017    WBC PRESENT,BOTH PMN AND MONONUCLEAR NO ORGANISMS SEEN Performed at [facility], [place] [address], [place], [place] [address]    CULT >=100,000 COLONIES/mL ESCHERICHIA COLI (A) 07/10/2019   LABORGA ESCHERICHIA COLI (A) 07/10/2019     Lab Results  Component Value Date   ALBUMIN 3.6 12/20/2018   ALBUMIN 3.0 (L) 05/09/2018   ALBUMIN 2.5 (L) 03/31/2018   PREALBUMIN 36 (H) 11/22/2017   PREALBUMIN 28.8 04/24/2017   LABURIC 6.0 03/09/2016    Lab Results  Component Value Date   MG 2.0 05/12/2018   MG 1.9 05/11/2018   MG 1.9 03/31/2018   No results found for: VD25OH  Lab Results  Component Value Date   PREALBUMIN 36 (H) 11/22/2017   PREALBUMIN 28.8 04/24/2017   CBC EXTENDED Latest Ref Rng & Units 07/10/2019 01/23/2019 12/20/2018  WBC 4.0 - 10.5 K/uL 8.5 7.3 11.3(H)  RBC 4.22 - 5.81 MIL/uL 3.51(L) 3.80(L) 3.80(L)  HGB 13.0 - 17.0 g/dL 10.9(L) 12.4(L) 12.5(L)  HCT 39.0 - 52.0 % 35.5(L) 41.2 40.0  PLT 150 - 400 K/uL 117(L) 135(L) 132(L)  NEUTROABS 1.7 - 7.7 K/uL 6.2 - 9.6(H)  LYMPHSABS 0.7 - 4.0 K/uL 1.1 - 0.6(L)     There is no height or weight on file to calculate BMI.  Orders:  No orders of the defined types were placed in this encounter.  No orders of the defined types were placed in this encounter.  Procedures: No procedures performed  Clinical Data: No additional findings.  ROS:  All other systems negative, except as noted in the HPI. Review of Systems  Objective: Vital Signs: There were no vitals taken for this visit.  Specialty Comments:  No specialty comments available.  PMFS History: Patient Active Problem List   Diagnosis Date Noted  . Dehiscence of amputation stump ([place])   . Wound infection 05/09/2018  . AKI (acute kidney injury) ([place])   . S/P BKA (below knee amputation) unilateral, right ([place])   . Below-knee amputation with complication, initial encounter ([place])   . Tachypnea   . Post-operative pain   . Subacute osteomyelitis, right ankle and foot ([place])   . Paralysis of right lower extremity ([place])   . TIA (transient ischemic attack) 03/24/2018  . Encephalopathy  03/24/2018  . Cellulitis 03/15/2018  . Hypernatremia 03/15/2018  . Persistent atrial fibrillation ([place]) 11/10/2017  . Severe muscle deconditioning 11/10/2017  . Hemorrhage 10/22/2017  . CAD S/P percutaneous coronary angioplasty 10/22/2017  . Acute hypoxemic respiratory failure ([place]) 07/17/2017  . Aspiration syndrome, subsequent encounter 07/11/2017  . Aspiration pneumonia ([place]) 07/01/2017  . Acute encephalopathy 07/01/2017  . Thrombocytopenia ([place]) 07/01/2017  . Chronic diastolic (congestive) heart failure ([place]) 07/01/2017  . Anemia of chronic disease 07/01/2017  . Troponin level elevated 07/01/2017  . Chronic atrial fibrillation 07/01/2017  . Goals of care, counseling/discussion   . Palliative care by specialist   . Acute metabolic encephalopathy 123456  . CKD (chronic kidney disease) stage 3, GFR 30-59 ml/min   . CAP (community acquired pneumonia) 04/17/2017  . Hallux rigidus, right foot 03/10/2016  . Lumbosacral spondylosis without myelopathy 10/29/2015  . Memory difficulty 09/22/2015  . Abnormality of gait 09/22/2015  . Hyperglycemia 07/06/2015  . Chronic pain 07/06/2015  . Chronic insomnia 03/30/2015  . Transient alteration of awareness 03/30/2015  . Abnormal liver function   . Altered mental status 01/31/2015  . Essential hypertension 01/31/2015  . Constipation 01/31/2015  . Hypothyroidism 01/31/2015  . Seizure disorder ([place]) 01/31/2015  . Bladder outlet obstruction 01/31/2015  . GERD (gastroesophageal reflux disease) 01/31/2015  . Chronic back pain 01/31/2015  . Acute kidney injury ([place]) 01/31/2015   Past Medical History:  Diagnosis Date  . Abnormality of gait 09/22/2015  . Arthritis   . Atrial fibrillation ([place])   . CAD (coronary artery disease)    Stent to RCA, Penta stent, 99% reduced to 0% 2002.  . Cancer ([place])    skin CA removed from back  . Chronic insomnia 03/30/2015  . Complication of anesthesia    trouble waking up  . GERD (gastroesophageal reflux  disease)   . Hypercholesteremia   . Hypertension   . Hypothyroidism   . Memory difficulty 09/22/2015  . Osteoarthritis   . Pneumonia   . Seizures ([place])   . Sepsis ([place]) 05/2017  . Transient alteration of awareness 03/30/2015  . Vertigo    hx of    Family History  Problem Relation Age of Onset  . Hypertension Mother   . Cancer Mother   . Kidney failure Father   . Heart disease Father     Past Surgical History:  Procedure Laterality Date  . AMPUTATION Right 03/28/2018   Procedure: AMPUTATION BELOW KNEE;  Surgeon: Newt Minion, MD;  Location: [facility];  Service: Orthopedics;  Laterality: Right;  . APPLICATION OF WOUND VAC Right 01/23/2019   Procedure: Application Of  Prevena Wound Vac;  Surgeon: Newt Minion, MD;  Location: [facility];  Service: Orthopedics;  Laterality: Right;  . BACK SURGERY    . EYE SURGERY     Bilateral Cataract surgery   . HERNIA REPAIR    . I & D EXTREMITY Right 05/10/2015   Procedure: IRRIGATION AND DEBRIDEMENT EXTREMITY;  Surgeon: Leanora Cover, MD;  Location: [facility];  Service: Orthopedics;  Laterality: Right;  . KNEE ARTHROPLASTY     right knee X 2; left knee once  . LAMINECTOMY     X 6  . LEG AMPUTATION BELOW KNEE Right 03/28/2018  . POSTERIOR CERVICAL FUSION/FORAMINOTOMY  01/28/2012   Procedure: POSTERIOR CERVICAL FUSION/FORAMINOTOMY LEVEL 3;  Surgeon: [name], MD;  Location: [facility];  Service: Neurosurgery;  Laterality: Left;  Posterior Cervical Five-Thoracic One Fusion, Arthrodesis with LEFT Cervical Seven-thoracic One Laminectomy, Foraminotomy and Resection of Synovial Cyst  . POSTERIOR CERVICAL FUSION/FORAMINOTOMY N/A 01/29/2013   Procedure: POSTERIOR CERVICAL FUSION/FORAMINOTOMY LEVEL 1 and C2-5 Posteriolateral Arthrodesis;  Surgeon: [name], MD;  Location: [facility];  Service: Neurosurgery;  Laterality: N/A;  C2-C3 Laminectomy C2-C3 posterior cervical arthrodesis  . STUMP REVISION Right 01/23/2019    Procedure: REVISION RIGHT BELOW KNEE AMPUTATION;  Surgeon: Newt Minion, MD;  Location: [facility];  Service: Orthopedics;  Laterality: Right;  . TONSILLECTOMY     Social History   Occupational History  . Occupation: retired Software engineer  Tobacco Use  . Smoking status: Former Research scientist (life sciences)  . Smokeless tobacco: Never Used  Substance and Sexual Activity  . Alcohol use: Not Currently    Comment: rare  . Drug use: No  . Sexual activity: Not Currently

## 2019-07-13 NOTE — Telephone Encounter (Signed)
Post ED Visit - Positive Culture Follow-up  Culture report reviewed by antimicrobial stewardship pharmacist: [facility] Team []  Elenor Quinones, Pharm.D. []  Heide Guile, Pharm.D., BCPS AQ-ID []  Parks Neptune, Pharm.D., BCPS []  Alycia Rossetti, Pharm.D., BCPS []  Eitzen, Pharm.D., BCPS, AAHIVP []  Legrand Como, Pharm.D., BCPS, AAHIVP []  Salome Arnt, PharmD, BCPS []  Johnnette Gourd, PharmD, BCPS []  Hughes Better, PharmD, BCPS []  Leeroy Cha, PharmD []  Laqueta Linden, PharmD, BCPS []  Albertina Parr, PharmD  [facility] Team []  Leodis Sias, PharmD []  Lindell Spar, PharmD []  Royetta Asal, PharmD []  Graylin Shiver, Rph []  Rema Fendt) Glennon Mac, PharmD []  Arlyn Dunning, PharmD [x]  [name], PharmD []  Dia Sitter, PharmD []  Leone Haven, PharmD []  Gretta Arab, PharmD []  Theodis Shove, PharmD []  Peggyann Juba, PharmD []  Reuel Boom, PharmD   Positive urine culture Treated with Sulfamethozxazole-Trimethoprim, organism sensitive to the same and no further patient follow-up is required at this time.  Harlon Flor [facility] 07/13/2019, 11:13 AM

## 2019-07-14 ENCOUNTER — Ambulatory Visit (INDEPENDENT_AMBULATORY_CARE_PROVIDER_SITE_OTHER): Payer: Medicare Other | Admitting: Physical Therapy

## 2019-07-14 ENCOUNTER — Encounter: Payer: Self-pay | Admitting: Physical Therapy

## 2019-07-14 DIAGNOSIS — R531 Weakness: Secondary | ICD-10-CM | POA: Diagnosis not present

## 2019-07-14 DIAGNOSIS — R293 Abnormal posture: Secondary | ICD-10-CM

## 2019-07-14 DIAGNOSIS — M545 Low back pain: Secondary | ICD-10-CM

## 2019-07-14 DIAGNOSIS — M6249 Contracture of muscle, multiple sites: Secondary | ICD-10-CM

## 2019-07-14 DIAGNOSIS — M542 Cervicalgia: Secondary | ICD-10-CM

## 2019-07-14 DIAGNOSIS — M25662 Stiffness of left knee, not elsewhere classified: Secondary | ICD-10-CM

## 2019-07-14 DIAGNOSIS — R2681 Unsteadiness on feet: Secondary | ICD-10-CM | POA: Diagnosis not present

## 2019-07-14 DIAGNOSIS — R2689 Other abnormalities of gait and mobility: Secondary | ICD-10-CM

## 2019-07-14 DIAGNOSIS — Z9181 History of falling: Secondary | ICD-10-CM

## 2019-07-14 DIAGNOSIS — M25661 Stiffness of right knee, not elsewhere classified: Secondary | ICD-10-CM

## 2019-07-14 DIAGNOSIS — G8929 Other chronic pain: Secondary | ICD-10-CM

## 2019-07-14 NOTE — Patient Instructions (Addendum)
Access Code: [email or web address] URL: [email or web address] Date: 07/14/2019 Prepared by: [facility]  Exercises Seated Hip Internal Rotation AROM - 1 x daily - 7 x weekly - 1 sets - 10 reps - 5 seconds hold Supine Hip Internal and External Rotation - 1 x daily - 7 x weekly - 1 sets - 10 reps - 5 seconds hold Seated Long Arc Quad - 1 x daily - 7 x weekly - 1 sets - 10 reps - 5 seconds hold Seated Quad Set - 1 x daily - 7 x weekly - 1 sets - 10 reps - 5 seconds hold Seated Isometric Hip Adduction with pillow & abduction with belt - 1 x daily - 7 x weekly - 1 sets - 10 reps - 5 seconds hold

## 2019-07-14 NOTE — Therapy (Signed)
[name] [address] [place], [place], [address] Phone: [phone]   Fax:  [phone]  Physical Therapy Treatment  Patient Details  Name: [name] MRN: [id number] Date of Birth: Jun 17, 1933 Referring Provider (PT): [name], [place]   Encounter Date: 07/14/2019  PT End of Session - 07/14/19 1143    Visit Number  15    Number of Visits  50    Date for PT Re-Evaluation  08/06/19    Authorization Type  BCBS & Medicare    PT Start Time  1142    PT Stop Time  1235    PT Time Calculation (min)  53 min    Equipment Utilized During Treatment  Gait belt    Activity Tolerance  Patient tolerated treatment well;Patient limited by fatigue    Behavior During Therapy  [name] for tasks assessed/performed       Past Medical History:  Diagnosis Date  . Abnormality of gait 09/22/2015  . Arthritis   . Atrial fibrillation ([place])   . CAD (coronary artery disease)    Stent to RCA, Penta stent, 99% reduced to 0% 2002.  . Cancer ([place])    skin CA removed from back  . Chronic insomnia 03/30/2015  . Complication of anesthesia    trouble waking up  . GERD (gastroesophageal reflux disease)   . Hypercholesteremia   . Hypertension   . Hypothyroidism   . Memory difficulty 09/22/2015  . Osteoarthritis   . Pneumonia   . Seizures ([place])   . Sepsis ([place]) 05/2017  . Transient alteration of awareness 03/30/2015  . Vertigo    hx of    Past Surgical History:  Procedure Laterality Date  . AMPUTATION Right 03/28/2018   Procedure: AMPUTATION BELOW KNEE;  Surgeon: Newt Minion, MD;  Location: [facility];  Service: Orthopedics;  Laterality: Right;  . APPLICATION OF WOUND VAC Right 01/23/2019   Procedure: Application Of  Prevena Wound Vac;  Surgeon: Newt Minion, MD;  Location: [facility];  Service: Orthopedics;  Laterality: Right;  . BACK SURGERY    . EYE SURGERY     Bilateral Cataract surgery   . HERNIA REPAIR    . I & D EXTREMITY Right 05/10/2015   Procedure:  IRRIGATION AND DEBRIDEMENT EXTREMITY;  Surgeon: Leanora Cover, MD;  Location: [facility];  Service: Orthopedics;  Laterality: Right;  . KNEE ARTHROPLASTY     right knee X 2; left knee once  . LAMINECTOMY     X 6  . LEG AMPUTATION BELOW KNEE Right 03/28/2018  . POSTERIOR CERVICAL FUSION/FORAMINOTOMY  01/28/2012   Procedure: POSTERIOR CERVICAL FUSION/FORAMINOTOMY LEVEL 3;  Surgeon: [name], MD;  Location: [facility];  Service: Neurosurgery;  Laterality: Left;  Posterior Cervical Five-Thoracic One Fusion, Arthrodesis with LEFT Cervical Seven-thoracic One Laminectomy, Foraminotomy and Resection of Synovial Cyst  . POSTERIOR CERVICAL FUSION/FORAMINOTOMY N/A 01/29/2013   Procedure: POSTERIOR CERVICAL FUSION/FORAMINOTOMY LEVEL 1 and C2-5 Posteriolateral Arthrodesis;  Surgeon: [name], MD;  Location: [facility];  Service: Neurosurgery;  Laterality: N/A;  C2-C3 Laminectomy C2-C3 posterior cervical arthrodesis  . STUMP REVISION Right 01/23/2019   Procedure: REVISION RIGHT BELOW KNEE AMPUTATION;  Surgeon: Newt Minion, MD;  Location: [facility];  Service: Orthopedics;  Laterality: Right;  . TONSILLECTOMY      There were no vitals filed for this visit.  Subjective Assessment - 07/14/19 1144    Subjective  He went toED with UTI. It is better with  antibiodic. He has worn prosthesis 4-5hrs 1x/day except one day that he spent in bed.    Patient is accompained by:  Family member   wife - [name]   Pertinent History  Rt TTA, C2-5 Cervical Fusion 2014, Tachypnea, encephalopathy, RLE paralysis, TIA, A-Fib, CAD, CHF, CKD stage 3, Sz disorder, hx of vertigo, knee arthroplasty Rt X 2 & Lt X 1    Limitations  Lifting;Standing;Walking;House hold activities    Patient Stated Goals  To get prosthesis, go to bathroom alone, get around house, get out in community.    Currently in Pain?  Yes    Pain Score  6     Pain Location  Hip    Pain Orientation  Right    Pain Descriptors /  Indicators  Aching;Sore    Pain Type  Chronic pain    Pain Onset  More than a month ago    Pain Frequency  Constant    Aggravating Factors   arthritis    Pain Relieving Factors  medications    Pain Onset  More than a month ago                       [name] - 07/14/19 1142      Transfers   Transfers  Sit to Stand;Stand to Sit;Anterior-Posterior Transfer;Lateral/Scoot Transfers;Stand Pivot Transfers    Sit to Stand  2: Max assist;With upper extremity assist;With armrests;From chair/3-in-1;Other (comment)   To RW   Sit to Stand Details  Tactile cues for weight shifting;Visual cues for safe use of DME/AE;Verbal cues for technique;Verbal cues for sequencing;Verbal cues for safe use of DME/AE    Stand to Sit  3: Mod assist;With upper extremity assist;With armrests;To chair/3-in-1;Other (comment)   from RW   Stand to Sit Details (indicate cue type and reason)  Tactile cues for weight shifting;Visual cues for safe use of DME/AE;Verbal cues for technique;Verbal cues for sequencing;Verbal cues for safe use of DME/AE    Stand Pivot Transfers  --    Lateral/Scoot Transfers  5: Supervision;With slide board   with prosthesis   Lateral/Scoot Transfer Details (indicate cue type and reason)  using both sliding board & prosthesis to facilitate patient performing most of acticvity without assistance.       Neuro Re-ed    Neuro Re-ed Details   --      Knee/Hip Exercises: Aerobic   Nustep  Level 5 with BUEs & BLEs 5 minutes  HR 77 SpO2 99% 2nd set 100% & 69 HR      Knee/Hip Exercises: Seated   Other Seated Knee/Hip Exercises  --    Other Seated Knee/Hip Exercises  --      Prosthetics   Prosthetic Care Comments   using circle on liner to assist pin alignment with donning liner.      Current prosthetic wear tolerance (days/week)   daily     Current prosthetic wear tolerance (#hours/day)   4-5 hrs 1-2x/day    Current prosthetic weight-bearing tolerance  (hours/day)   no pain reported with today's attempted weight bearing activities     Residual limb condition   No open area noted on previously wound area, normal redness / pink in pressure tolerant areas of socket    Education Provided  Skin check;Residual limb care;Proper Donning;Proper Doffing;Proper wear schedule/adjustment;Other (comment)   see prosthetic care comments   Person(s) Educated  Patient;Spouse    Education Method  Explanation;Demonstration;Tactile cues;Verbal cues    Education  Method  Verbalized understanding;Tactile cues required;Verbal cues required;Needs further instruction    Donning Prosthesis  Minimal assist    Doffing Prosthesis  Supervision           Access Code: [email or web address] URL: [email or web address] Date: 07/14/2019 Prepared by: [facility]  Exercises Seated Hip Internal Rotation AROM - 1 x daily - 7 x weekly - 1 sets - 10 reps - 5 seconds hold Supine Hip Internal and External Rotation - 1 x daily - 7 x weekly - 1 sets - 10 reps - 5 seconds hold Seated Long Arc Quad - 1 x daily - 7 x weekly - 1 sets - 10 reps - 5 seconds hold Seated Quad Set - 1 x daily - 7 x weekly - 1 sets - 10 reps - 5 seconds hold Seated Isometric Hip Adduction with pillow & abduction with belt - 1 x daily - 7 x weekly - 1 sets - 10 reps - 5 seconds hold     PT Education - 07/14/19 1246    Education Details  HEP Med[employer] [id number]    Person(s) Educated  Patient;Spouse    Methods  Explanation;Demonstration;Tactile cues;Verbal cues;Handout    Comprehension  Verbalized understanding;Returned demonstration;Verbal cues required;Tactile cues required;Need further instruction       PT Short Term Goals - 07/14/19 1256      PT SHORT TERM GOAL #1   Title  Patient donnes prosthesis without assistance with verbal cues only. (All [facility] STGs: 08/06/2019)    Time  1    Period  Months    Status  New    Target Date  08/06/19      PT SHORT TERM GOAL #2   Title  Sit  to/from stand at sink with minA / wife able to assist and stand for 3 minutes with min guard.    Time  1    Period  Months    Status  On-going    Target Date  08/06/19      PT SHORT TERM GOAL #3   Title  Patient sit to/from stand w/c to RW with modA    Time  1    Period  Months    Status  New    Target Date  08/06/19      PT SHORT TERM GOAL #4   Title  Patient tolerates prosthesis wear >8 hours total /day without wound issues.    Time  1    Period  Months    Status  Revised    Target Date  08/06/19      PT SHORT TERM GOAL #5   Title  Patient ambulates 10' in parallel bars with modA.    Time  1    Period  Months    Status  On-going    Target Date  08/06/19        PT Long Term Goals - 05/18/19 1244      PT LONG TERM GOAL #1   Title  Patient and wife verbalize & demonstrate understanding of proper prosthetic care to enable safe utilization of prosthesis. (All LTGs Target Date: 11/03/2019)    Time  6    Period  Months    Status  On-going    Target Date  11/03/19      PT LONG TERM GOAL #2   Title  Patient tolerates wear of prosthesis >90% of awake hours without skin or residual limb pain issues to enable function during his day.  Time  6    Period  Months    Status  On-going    Target Date  11/03/19      PT LONG TERM GOAL #3   Title  Standing balance with RW support: maintains upright 2 minutes, reaches 5" anteriorly and scans environment with supervision.    Time  6    Period  Months    Status  On-going    Target Date  11/03/19      PT LONG TERM GOAL #4   Title  Patient ambulates 100' with RW & prosthesis with wife's assistance / supervision and turns 180* to position to sit safely.    Time  6    Period  Months    Status  On-going    Target Date  11/03/19      PT LONG TERM GOAL #5   Title  Sit to/from stand and stand-pivot transfers with RW & prosthesis with supervision.    Time  6    Period  Months    Status  On-going    Target Date  11/03/19             Plan - 07/14/19 1143    Clinical Impression Statement  PT educated on updated HEP and pt/wife appear to understand exercises.  PT recommended using both sliding board & prosthesis with transfers including car so patient can perform majority of activity    Personal Factors and Comorbidities  Age;Comorbidity 3+;Fitness;Past/Current Experience;Time since onset of injury/illness/exacerbation    Comorbidities  Rt TTA, C2-5 Cervical Fusion 2014, Tachypnea, encephalopathy, RLE paralysis, TIA, A-Fib, CAD, CHF, CKD stage 3, Sz disorder, hx of vertigo, knee arthroplasty Rt X 2 & Lt X 1    Examination-Activity Limitations  Caring for Others;Locomotion Level;Reach Overhead;Squat;Stairs;Stand;Transfers    Examination-Participation Restrictions  Community Activity    Stability/Clinical Decision Making  Evolving/Moderate complexity    Rehab Potential  Good    PT Frequency  2x / week    PT Duration  Other (comment)   25 weeks (6 months)   PT Treatment/Interventions  ADLs/Self Care Home Management;DME Instruction;Gait training;Stair training;Functional mobility training;Therapeutic activities;Therapeutic exercise;Balance training;Neuromuscular re-education;Patient/family education;Prosthetic Training    PT Next Visit Plan  work towards updated STGs, check how HEP is going & transfers with sliding board / prosthesis    Consulted and Agree with Plan of Care  Patient;Family member/caregiver    Family Member Consulted  wife, Jaysiah Francesconi       Patient will benefit from skilled therapeutic intervention in order to improve the following deficits and impairments:  Abnormal gait, Cardiopulmonary status limiting activity, Decreased activity tolerance, Decreased balance, Decreased endurance, Decreased knowledge of use of DME, Decreased mobility, Decreased range of motion, Decreased skin integrity, Decreased strength, Difficulty walking, Increased edema, Impaired flexibility, Postural dysfunction, Prosthetic  Dependency, Pain  Visit Diagnosis: Other abnormalities of gait and mobility  Unsteadiness on feet  Weakness generalized  Abnormal posture  History of fall  Stiffness of right knee, not elsewhere classified  Contracture of muscle, multiple sites  Cervicalgia  Stiffness of left knee, not elsewhere classified  Chronic midline low back pain without sciatica     Problem List Patient Active Problem List   Diagnosis Date Noted  . Dehiscence of amputation stump ([place])   . Wound infection 05/09/2018  . AKI (acute kidney injury) ([place])   . S/P BKA (below knee amputation) unilateral, right ([place])   . Below-knee amputation with complication, initial encounter ([place])   . Tachypnea   .  Post-operative pain   . Subacute osteomyelitis, right ankle and foot ([place])   . Paralysis of right lower extremity ([place])   . TIA (transient ischemic attack) 03/24/2018  . Encephalopathy 03/24/2018  . Cellulitis 03/15/2018  . Hypernatremia 03/15/2018  . Persistent atrial fibrillation ([place]) 11/10/2017  . Severe muscle deconditioning 11/10/2017  . Hemorrhage 10/22/2017  . CAD S/P percutaneous coronary angioplasty 10/22/2017  . Acute hypoxemic respiratory failure ([place]) 07/17/2017  . Aspiration syndrome, subsequent encounter 07/11/2017  . Aspiration pneumonia ([place]) 07/01/2017  . Acute encephalopathy 07/01/2017  . Thrombocytopenia ([place]) 07/01/2017  . Chronic diastolic (congestive) heart failure ([place]) 07/01/2017  . Anemia of chronic disease 07/01/2017  . Troponin level elevated 07/01/2017  . Chronic atrial fibrillation 07/01/2017  . Goals of care, counseling/discussion   . Palliative care by specialist   . Acute metabolic encephalopathy [id number]  . CKD (chronic kidney disease) stage 3, GFR 30-59 ml/min   . CAP (community acquired pneumonia) 04/17/2017  . Hallux rigidus, right foot 03/10/2016  . Lumbosacral spondylosis without myelopathy 10/29/2015  . Memory difficulty 09/22/2015  . Abnormality of  gait 09/22/2015  . Hyperglycemia 07/06/2015  . Chronic pain 07/06/2015  . Chronic insomnia 03/30/2015  . Transient alteration of awareness 03/30/2015  . Abnormal liver function   . Altered mental status 01/31/2015  . Essential hypertension 01/31/2015  . Constipation 01/31/2015  . Hypothyroidism 01/31/2015  . Seizure disorder ([place]) 01/31/2015  . Bladder outlet obstruction 01/31/2015  . GERD (gastroesophageal reflux disease) 01/31/2015  . Chronic back pain 01/31/2015  . Acute kidney injury ([place]) 01/31/2015    Jamey Reas PT, DPT 07/14/2019, 12:59 PM  [name] [address] [place], [place], [address] Phone: [phone]   Fax:  [phone]  Name: [name] MRN: [id number] Date of Birth: [birth year]

## 2019-07-16 ENCOUNTER — Other Ambulatory Visit: Payer: Self-pay

## 2019-07-16 ENCOUNTER — Encounter: Payer: Self-pay | Admitting: Physical Therapy

## 2019-07-16 ENCOUNTER — Ambulatory Visit (INDEPENDENT_AMBULATORY_CARE_PROVIDER_SITE_OTHER): Payer: Medicare Other | Admitting: Physical Therapy

## 2019-07-16 DIAGNOSIS — R2689 Other abnormalities of gait and mobility: Secondary | ICD-10-CM

## 2019-07-16 DIAGNOSIS — Z9181 History of falling: Secondary | ICD-10-CM

## 2019-07-16 DIAGNOSIS — M545 Low back pain, unspecified: Secondary | ICD-10-CM

## 2019-07-16 DIAGNOSIS — R2681 Unsteadiness on feet: Secondary | ICD-10-CM

## 2019-07-16 DIAGNOSIS — M6249 Contracture of muscle, multiple sites: Secondary | ICD-10-CM

## 2019-07-16 DIAGNOSIS — R531 Weakness: Secondary | ICD-10-CM

## 2019-07-16 DIAGNOSIS — R293 Abnormal posture: Secondary | ICD-10-CM

## 2019-07-16 DIAGNOSIS — M25661 Stiffness of right knee, not elsewhere classified: Secondary | ICD-10-CM

## 2019-07-16 DIAGNOSIS — M542 Cervicalgia: Secondary | ICD-10-CM

## 2019-07-16 DIAGNOSIS — G8929 Other chronic pain: Secondary | ICD-10-CM

## 2019-07-16 NOTE — Therapy (Signed)
[facility] [address] [place], [place], [address] Phone: [phone]   Fax:  [phone]  Physical Therapy Treatment  Patient Details  Name: [name] MRN: [id number] Date of Birth: 03-Jun-1933 Referring Provider (PT): [name], [place]   Encounter Date: 07/16/2019  PT End of Session - 07/16/19 1255    Visit Number  16    Number of Visits  50    Date for PT Re-Evaluation  08/06/19    Authorization Type  BCBS & Medicare    PT Start Time  1020    PT Stop Time  1110    PT Time Calculation (min)  50 min    Equipment Utilized During Treatment  Gait belt    Activity Tolerance  Patient tolerated treatment well;Patient limited by fatigue    Behavior During Therapy  [facility] for tasks assessed/performed       Past Medical History:  Diagnosis Date  . Abnormality of gait 09/22/2015  . Arthritis   . Atrial fibrillation ([place])   . CAD (coronary artery disease)    Stent to RCA, Penta stent, 99% reduced to 0% 2002.  . Cancer ([place])    skin CA removed from back  . Chronic insomnia 03/30/2015  . Complication of anesthesia    trouble waking up  . GERD (gastroesophageal reflux disease)   . Hypercholesteremia   . Hypertension   . Hypothyroidism   . Memory difficulty 09/22/2015  . Osteoarthritis   . Pneumonia   . Seizures ([place])   . Sepsis ([place]) 05/2017  . Transient alteration of awareness 03/30/2015  . Vertigo    hx of    Past Surgical History:  Procedure Laterality Date  . AMPUTATION Right 03/28/2018   Procedure: AMPUTATION BELOW KNEE;  Surgeon: Newt Minion, MD;  Location: [place];  Service: Orthopedics;  Laterality: Right;  . APPLICATION OF WOUND VAC Right 01/23/2019   Procedure: Application Of  Prevena Wound Vac;  Surgeon: Newt Minion, MD;  Location: [place];  Service: Orthopedics;  Laterality: Right;  . BACK SURGERY    . EYE SURGERY     Bilateral Cataract surgery   . HERNIA REPAIR    . I & D EXTREMITY Right 05/10/2015   Procedure:  IRRIGATION AND DEBRIDEMENT EXTREMITY;  Surgeon: Leanora Cover, MD;  Location: [place];  Service: Orthopedics;  Laterality: Right;  . KNEE ARTHROPLASTY     right knee X 2; left knee once  . LAMINECTOMY     X 6  . LEG AMPUTATION BELOW KNEE Right 03/28/2018  . POSTERIOR CERVICAL FUSION/FORAMINOTOMY  01/28/2012   Procedure: POSTERIOR CERVICAL FUSION/FORAMINOTOMY LEVEL 3;  Surgeon: [name], MD;  Location: [place] [facility];  Service: Neurosurgery;  Laterality: Left;  Posterior Cervical Five-Thoracic One Fusion, Arthrodesis with LEFT Cervical Seven-thoracic One Laminectomy, Foraminotomy and Resection of Synovial Cyst  . POSTERIOR CERVICAL FUSION/FORAMINOTOMY N/A 01/29/2013   Procedure: POSTERIOR CERVICAL FUSION/FORAMINOTOMY LEVEL 1 and C2-5 Posteriolateral Arthrodesis;  Surgeon: [name], MD;  Location: [place] [facility];  Service: Neurosurgery;  Laterality: N/A;  C2-C3 Laminectomy C2-C3 posterior cervical arthrodesis  . STUMP REVISION Right 01/23/2019   Procedure: REVISION RIGHT BELOW KNEE AMPUTATION;  Surgeon: Newt Minion, MD;  Location: [place];  Service: Orthopedics;  Laterality: Right;  . TONSILLECTOMY      There were no vitals filed for this visit.  Subjective Assessment - 07/16/19 0935    Subjective  He has been doing his exercises.  He is  wearing prosthesis 4-5hrs but only once / day.    Patient is accompained by:  Family member   wife - [name]   Pertinent History  Rt TTA, C2-5 Cervical Fusion 2014, Tachypnea, encephalopathy, RLE paralysis, TIA, A-Fib, CAD, CHF, CKD stage 3, Sz disorder, hx of vertigo, knee arthroplasty Rt X 2 & Lt X 1    Limitations  Lifting;Standing;Walking;House hold activities    Patient Stated Goals  To get prosthesis, go to bathroom alone, get around house, get out in community.    Currently in Pain?  Other (Comment)   arthritic pain neck, back. left shoulder 5/10 but no increase with PT session   Pain Onset  More than a month ago     Pain Onset  More than a month ago                       [facility] Adult PT Treatment/Exercise - 07/16/19 1025      Transfers   Transfers  Sit to Stand;Stand to Sit;Anterior-Posterior Transfer;Lateral/Scoot Transfers;Stand Pivot Transfers    Sit to Stand  2: Max assist;With upper extremity assist;With armrests;From chair/3-in-1;Other (comment)   To sink   Sit to Stand Details  Tactile cues for weight shifting;Visual cues for safe use of DME/AE;Verbal cues for technique;Verbal cues for sequencing;Verbal cues for safe use of DME/AE    Sit to Stand Details (indicate cue type and reason)  worked on pushing with UEs anteriorly to lift buttocks off chair 5 reps with demo, manual & verbal cues on proper weight shift;  progressed to reaching single UE to sink 2 reps RUE & 2 resp LUE;  Pt was better reaching with RUE first;  progressed to reaching 2nd hand to sink & pushing upward 3 reps.      Stand to Sit  3: Mod assist;With upper extremity assist;With armrests;To chair/3-in-1;Other (comment)   from RW   Stand to Sit Details (indicate cue type and reason)  Tactile cues for weight shifting;Visual cues for safe use of DME/AE;Verbal cues for technique;Verbal cues for sequencing;Verbal cues for safe use of DME/AE    Stand to Sit Details  manual & verbal cues on technique including bow, reaching back with LUE to armrests.      Lateral/Scoot Transfers  4: Min assist;With slide board   with prosthesis   Lateral/Scoot Transfer Details (indicate cue type and reason)  w/c to/from [place]ust[place] without sliding board with manual & verbal cues;  w/c to/from car with sliding board using prosthesis       Knee/Hip Exercises: Aerobic   Nustep  Level 5 with BUEs & BLEs 5 minutes  HR 77 SpO2 99% 2nd set 100% & 69 HR      Prosthetics   Prosthetic Care Comments   verbal cues on aligning pin lock.      Current prosthetic wear tolerance (days/week)   daily     Current prosthetic wear tolerance (#hours/day)   4-5 hrs  1-2x/day    Current prosthetic weight-bearing tolerance (hours/day)   no pain reported with today's attempted weight bearing activities     Residual limb condition   No open area noted on previously wound area, normal redness / pink in pressure tolerant areas of socket    Education Provided  Skin check;Residual limb care;Proper Donning;Proper Doffing;Proper wear schedule/adjustment;Other (comment)   see prosthetic care comments   Person(s) Educated  Patient;Spouse    Education Method  Explanation;Demonstration;Tactile cues;Verbal cues    Education Method  Verbalized understanding;Returned  demonstration;Tactile cues required;Verbal cues required;Needs further instruction    Donning Prosthesis  Minimal assist   significant increased time   Doffing Prosthesis  Minimal assist   significant increased time              PT Short Term Goals - 07/14/19 1256      PT SHORT TERM GOAL #1   Title  Patient donnes prosthesis without assistance with verbal cues only. (All [facility] STGs: 08/06/2019)    Time  1    Period  Months    Status  New    Target Date  08/06/19      PT SHORT TERM GOAL #2   Title  Sit to/from stand at sink with minA / wife able to assist and stand for 3 minutes with min guard.    Time  1    Period  Months    Status  On-going    Target Date  08/06/19      PT SHORT TERM GOAL #3   Title  Patient sit to/from stand w/c to RW with modA    Time  1    Period  Months    Status  New    Target Date  08/06/19      PT SHORT TERM GOAL #4   Title  Patient tolerates prosthesis wear >8 hours total /day without wound issues.    Time  1    Period  Months    Status  Revised    Target Date  08/06/19      PT SHORT TERM GOAL #5   Title  Patient ambulates 10' in parallel bars with modA.    Time  1    Period  Months    Status  On-going    Target Date  08/06/19        PT Long Term Goals - 05/18/19 1244      PT LONG TERM GOAL #1   Title  Patient and wife verbalize &  demonstrate understanding of proper prosthetic care to enable safe utilization of prosthesis. (All LTGs Target Date: 11/03/2019)    Time  6    Period  Months    Status  On-going    Target Date  11/03/19      PT LONG TERM GOAL #2   Title  Patient tolerates wear of prosthesis >90% of awake hours without skin or residual limb pain issues to enable function during his day.    Time  6    Period  Months    Status  On-going    Target Date  11/03/19      PT LONG TERM GOAL #3   Title  Standing balance with RW support: maintains upright 2 minutes, reaches 5" anteriorly and scans environment with supervision.    Time  6    Period  Months    Status  On-going    Target Date  11/03/19      PT LONG TERM GOAL #4   Title  Patient ambulates 100' with RW & prosthesis with wife's assistance / supervision and turns 180* to position to sit safely.    Time  6    Period  Months    Status  On-going    Target Date  11/03/19      PT LONG TERM GOAL #5   Title  Sit to/from stand and stand-pivot transfers with RW & prosthesis with supervision.    Time  6    Period  Months  Status  On-going    Target Date  11/03/19            Plan - 07/16/19 1739    Clinical Impression Statement  Patient improved transfers scooting with sliding board & prosthesis.  PT broke down sit to stand at sink and he improved with instruction in each component.    Personal Factors and Comorbidities  Age;Comorbidity 3+;Fitness;Past/Current Experience;Time since onset of injury/illness/exacerbation    Comorbidities  Rt TTA, C2-5 Cervical Fusion 2014, Tachypnea, encephalopathy, RLE paralysis, TIA, A-Fib, CAD, CHF, CKD stage 3, Sz disorder, hx of vertigo, knee arthroplasty Rt X 2 & Lt X 1    Examination-Activity Limitations  Caring for Others;Locomotion Level;Reach Overhead;Squat;Stairs;Stand;Transfers    Examination-Participation Restrictions  Community Activity    Stability/Clinical Decision Making  Evolving/Moderate complexity     Rehab Potential  Good    PT Frequency  2x / week    PT Duration  Other (comment)   25 weeks (6 months)   PT Treatment/Interventions  ADLs/Self Care Home Management;DME Instruction;Gait training;Stair training;Functional mobility training;Therapeutic activities;Therapeutic exercise;Balance training;Neuromuscular re-education;Patient/family education;Prosthetic Training    PT Next Visit Plan  work towards updated STGs, check how HEP is going & transfers with sliding board / prosthesis    Consulted and Agree with Plan of Care  Patient;Family member/caregiver    Family Member Consulted  wife, Alejando Kopper       Patient will benefit from skilled therapeutic intervention in order to improve the following deficits and impairments:  Abnormal gait, Cardiopulmonary status limiting activity, Decreased activity tolerance, Decreased balance, Decreased endurance, Decreased knowledge of use of DME, Decreased mobility, Decreased range of motion, Decreased skin integrity, Decreased strength, Difficulty walking, Increased edema, Impaired flexibility, Postural dysfunction, Prosthetic Dependency, Pain  Visit Diagnosis: Other abnormalities of gait and mobility  Unsteadiness on feet  Abnormal posture  Weakness generalized  History of fall  Stiffness of right knee, not elsewhere classified  Contracture of muscle, multiple sites  Cervicalgia  Chronic midline low back pain without sciatica     Problem List Patient Active Problem List   Diagnosis Date Noted  . Dehiscence of amputation stump ([place])   . Wound infection 05/09/2018  . AKI (acute kidney injury) ([place])   . S/P BKA (below knee amputation) unilateral, right ([place])   . Below-knee amputation with complication, initial encounter ([place])   . Tachypnea   . Post-operative pain   . Subacute osteomyelitis, right ankle and foot ([place])   . Paralysis of right lower extremity ([place])   . TIA (transient ischemic attack) 03/24/2018  . Encephalopathy  03/24/2018  . Cellulitis 03/15/2018  . Hypernatremia 03/15/2018  . Persistent atrial fibrillation ([place]) 11/10/2017  . Severe muscle deconditioning 11/10/2017  . Hemorrhage 10/22/2017  . CAD S/P percutaneous coronary angioplasty 10/22/2017  . Acute hypoxemic respiratory failure ([place]) 07/17/2017  . Aspiration syndrome, subsequent encounter 07/11/2017  . Aspiration pneumonia ([place]) 07/01/2017  . Acute encephalopathy 07/01/2017  . Thrombocytopenia ([place]) 07/01/2017  . Chronic diastolic (congestive) heart failure ([place]) 07/01/2017  . Anemia of chronic disease 07/01/2017  . Troponin level elevated 07/01/2017  . Chronic atrial fibrillation 07/01/2017  . Goals of care, counseling/discussion   . Palliative care by specialist   . Acute metabolic encephalopathy [id number]  . CKD (chronic kidney disease) stage 3, GFR 30-59 ml/min   . CAP (community acquired pneumonia) 04/17/2017  . Hallux rigidus, right foot 03/10/2016  . Lumbosacral spondylosis without myelopathy 10/29/2015  . Memory difficulty 09/22/2015  . Abnormality of gait 09/22/2015  .  Hyperglycemia 07/06/2015  . Chronic pain 07/06/2015  . Chronic insomnia 03/30/2015  . Transient alteration of awareness 03/30/2015  . Abnormal liver function   . Altered mental status 01/31/2015  . Essential hypertension 01/31/2015  . Constipation 01/31/2015  . Hypothyroidism 01/31/2015  . Seizure disorder ([place]) 01/31/2015  . Bladder outlet obstruction 01/31/2015  . GERD (gastroesophageal reflux disease) 01/31/2015  . Chronic back pain 01/31/2015  . Acute kidney injury ([place]) 01/31/2015    Jamey Reas PT, DPT 07/16/2019, 5:44 PM  [facility] [address] [place], [place], [address] Phone: [phone]   Fax:  [phone]  Name: [name] MRN: [id number] Date of Birth: 1933/06/05

## 2019-07-20 ENCOUNTER — Other Ambulatory Visit: Payer: Self-pay

## 2019-07-20 ENCOUNTER — Ambulatory Visit (INDEPENDENT_AMBULATORY_CARE_PROVIDER_SITE_OTHER): Payer: Medicare Other | Admitting: Physical Therapy

## 2019-07-20 ENCOUNTER — Encounter: Payer: Self-pay | Admitting: Physical Therapy

## 2019-07-20 DIAGNOSIS — R531 Weakness: Secondary | ICD-10-CM

## 2019-07-20 DIAGNOSIS — R2681 Unsteadiness on feet: Secondary | ICD-10-CM | POA: Diagnosis not present

## 2019-07-20 DIAGNOSIS — M542 Cervicalgia: Secondary | ICD-10-CM

## 2019-07-20 DIAGNOSIS — M25661 Stiffness of right knee, not elsewhere classified: Secondary | ICD-10-CM

## 2019-07-20 DIAGNOSIS — R293 Abnormal posture: Secondary | ICD-10-CM

## 2019-07-20 DIAGNOSIS — M6249 Contracture of muscle, multiple sites: Secondary | ICD-10-CM

## 2019-07-20 DIAGNOSIS — R2689 Other abnormalities of gait and mobility: Secondary | ICD-10-CM | POA: Diagnosis not present

## 2019-07-20 MED ORDER — FUROSEMIDE 20 MG PO TABS: 20.0000 mg | ORAL_TABLET | Freq: Every day | ORAL | 3 refills | Status: DC

## 2019-07-20 NOTE — Therapy (Signed)
[facility] [address] [place], [place], [address] Phone: [phone]   Fax:  [phone]  Physical Therapy Treatment  Patient Details  Name: [name] MRN: [id number] Date of Birth: [birth year] Referring Provider (PT): Bevely Palmer Persons, [place]   Encounter Date: 07/20/2019  PT End of Session - 07/20/19 1258    Visit Number  17    Number of Visits  50    Date for PT Re-Evaluation  08/06/19    Authorization Type  BCBS & Medicare    PT Start Time  1103    PT Stop Time  1145    PT Time Calculation (min)  42 min    Equipment Utilized During Treatment  Gait belt    Activity Tolerance  Patient tolerated treatment well;Patient limited by fatigue    Behavior During Therapy  [facility] for tasks assessed/performed       Past Medical History:  Diagnosis Date  . Abnormality of gait 09/22/2015  . Arthritis   . Atrial fibrillation ([place])   . CAD (coronary artery disease)    Stent to RCA, Penta stent, 99% reduced to 0% 2002.  . Cancer ([place])    skin CA removed from back  . Chronic insomnia 03/30/2015  . Complication of anesthesia    trouble waking up  . GERD (gastroesophageal reflux disease)   . Hypercholesteremia   . Hypertension   . Hypothyroidism   . Memory difficulty 09/22/2015  . Osteoarthritis   . Pneumonia   . Seizures ([place])   . Sepsis ([place]) 05/2017  . Transient alteration of awareness 03/30/2015  . Vertigo    hx of    Past Surgical History:  Procedure Laterality Date  . AMPUTATION Right 03/28/2018   Procedure: AMPUTATION BELOW KNEE;  Surgeon: Newt Minion, MD;  Location: [place];  Service: Orthopedics;  Laterality: Right;  . APPLICATION OF WOUND VAC Right 01/23/2019   Procedure: Application Of  Prevena Wound Vac;  Surgeon: Newt Minion, MD;  Location: [place];  Service: Orthopedics;  Laterality: Right;  . BACK SURGERY    . EYE SURGERY     Bilateral Cataract surgery   . HERNIA REPAIR    . I & D EXTREMITY Right 05/10/2015   Procedure:  IRRIGATION AND DEBRIDEMENT EXTREMITY;  Surgeon: Leanora Cover, MD;  Location: [place];  Service: Orthopedics;  Laterality: Right;  . KNEE ARTHROPLASTY     right knee X 2; left knee once  . LAMINECTOMY     X 6  . LEG AMPUTATION BELOW KNEE Right 03/28/2018  . POSTERIOR CERVICAL FUSION/FORAMINOTOMY  01/28/2012   Procedure: POSTERIOR CERVICAL FUSION/FORAMINOTOMY LEVEL 3;  Surgeon: [name], MD;  Location: [place] [facility];  Service: Neurosurgery;  Laterality: Left;  Posterior Cervical Five-Thoracic One Fusion, Arthrodesis with LEFT Cervical Seven-thoracic One Laminectomy, Foraminotomy and Resection of Synovial Cyst  . POSTERIOR CERVICAL FUSION/FORAMINOTOMY N/A 01/29/2013   Procedure: POSTERIOR CERVICAL FUSION/FORAMINOTOMY LEVEL 1 and C2-5 Posteriolateral Arthrodesis;  Surgeon: [name], MD;  Location: [place] [facility];  Service: Neurosurgery;  Laterality: N/A;  C2-C3 Laminectomy C2-C3 posterior cervical arthrodesis  . STUMP REVISION Right 01/23/2019   Procedure: REVISION RIGHT BELOW KNEE AMPUTATION;  Surgeon: Newt Minion, MD;  Location: [place];  Service: Orthopedics;  Laterality: Right;  . TONSILLECTOMY      There were no vitals filed for this visit.  Subjective Assessment - 07/20/19 1103    Subjective  He is wearing prosthesis 4-5 hrs 1-2 /day. He  is doing his exercises.    Patient is accompained by:  Family member   wife - [name]   Pertinent History  Rt TTA, C2-5 Cervical Fusion 2014, Tachypnea, encephalopathy, RLE paralysis, TIA, A-Fib, CAD, CHF, CKD stage 3, Sz disorder, hx of vertigo, knee arthroplasty Rt X 2 & Lt X 1    Limitations  Lifting;Standing;Walking;House hold activities    Patient Stated Goals  To get prosthesis, go to bathroom alone, get around house, get out in community.    Currently in Pain?  Yes    Pain Score  4     Pain Location  Shoulder   left shoulder & right hip   Pain Orientation  Left;Right    Pain Descriptors / Indicators  Aching     Pain Type  Chronic pain    Pain Onset  More than a month ago    Pain Frequency  Constant    Aggravating Factors   arthritis    Pain Relieving Factors  medication    Pain Onset  More than a month ago                       [facility] Adult PT Treatment/Exercise - 07/20/19 1105      Transfers   Transfers  Sit to Stand;Stand to Sit;Anterior-Posterior Transfer;Lateral/Scoot Transfers;Stand Pivot Transfers    Sit to Stand  2: Max assist;With upper extremity assist;With armrests;From chair/3-in-1;Other (comment)   To sink   Sit to Stand Details  Tactile cues for weight shifting;Visual cues for safe use of DME/AE;Verbal cues for technique;Verbal cues for sequencing;Verbal cues for safe use of DME/AE    Stand to Sit  3: Mod assist;With upper extremity assist;With armrests;To chair/3-in-1;Other (comment)   from RW   Stand to Sit Details (indicate cue type and reason)  Tactile cues for weight shifting;Visual cues for safe use of DME/AE;Verbal cues for technique;Verbal cues for sequencing;Verbal cues for safe use of DME/AE    Lateral/Scoot Transfer Details (indicate cue type and reason)  PT educated on positioning w/c transport with armrests that do not come off using sliding board & prosthesis.  Pt & wife verbalized understanding.        Neuro Re-ed    Neuro Re-ed Details   standing at sink with tactile & verbal cues on upright posture & wt shift to RLE prosthesis.  1st time 1 min with minA & 2nd time 2 minutes min guard.        Prosthetics   Prosthetic Care Comments   PT instruction in socks & pads (prosthetist to add) to modify fit with shrinkage.  Donning liner & prosthesis with Left thigh off w/c.      Current prosthetic wear tolerance (days/week)   daily     Current prosthetic wear tolerance (#hours/day)   4-5 hrs 1-2x/day    Current prosthetic weight-bearing tolerance (hours/day)   no pain reported with today's attempted weight bearing activities     Residual limb condition   No  open area noted on previously wound area, normal redness / pink in pressure tolerant areas of socket    Education Provided  Skin check;Residual limb care;Proper Donning;Proper Doffing;Proper wear schedule/adjustment;Other (comment);Ply sock cleaning;Correct ply sock adjustment   see prosthetic care comments   Person(s) Educated  Patient;Spouse    Education Method  Explanation;Demonstration;Tactile cues;Verbal cues    Education Method  Verbalized understanding;Verbal cues required;Needs further instruction    Donning Prosthesis  Minimal assist   increased time  Doffing Prosthesis  Minimal assist   increased time              PT Short Term Goals - 07/14/19 1256      PT SHORT TERM GOAL #1   Title  Patient donnes prosthesis without assistance with verbal cues only. (All [facility] STGs: 08/06/2019)    Time  1    Period  Months    Status  New    Target Date  08/06/19      PT SHORT TERM GOAL #2   Title  Sit to/from stand at sink with minA / wife able to assist and stand for 3 minutes with min guard.    Time  1    Period  Months    Status  On-going    Target Date  08/06/19      PT SHORT TERM GOAL #3   Title  Patient sit to/from stand w/c to RW with modA    Time  1    Period  Months    Status  New    Target Date  08/06/19      PT SHORT TERM GOAL #4   Title  Patient tolerates prosthesis wear >8 hours total /day without wound issues.    Time  1    Period  Months    Status  Revised    Target Date  08/06/19      PT SHORT TERM GOAL #5   Title  Patient ambulates 10' in parallel bars with modA.    Time  1    Period  Months    Status  On-going    Target Date  08/06/19        PT Long Term Goals - 05/18/19 1244      PT LONG TERM GOAL #1   Title  Patient and wife verbalize & demonstrate understanding of proper prosthetic care to enable safe utilization of prosthesis. (All LTGs Target Date: 11/03/2019)    Time  6    Period  Months    Status  On-going    Target Date   11/03/19      PT LONG TERM GOAL #2   Title  Patient tolerates wear of prosthesis >90% of awake hours without skin or residual limb pain issues to enable function during his day.    Time  6    Period  Months    Status  On-going    Target Date  11/03/19      PT LONG TERM GOAL #3   Title  Standing balance with RW support: maintains upright 2 minutes, reaches 5" anteriorly and scans environment with supervision.    Time  6    Period  Months    Status  On-going    Target Date  11/03/19      PT LONG TERM GOAL #4   Title  Patient ambulates 100' with RW & prosthesis with wife's assistance / supervision and turns 180* to position to sit safely.    Time  6    Period  Months    Status  On-going    Target Date  11/03/19      PT LONG TERM GOAL #5   Title  Sit to/from stand and stand-pivot transfers with RW & prosthesis with supervision.    Time  6    Period  Months    Status  On-going    Target Date  11/03/19            Plan -  07/20/19 1258    Clinical Impression Statement  Patient improved sit to stand to sit at sink with detailed instruction in technique.  Patient's wife appears to understand how prosthesis with sliding board improves patient's participation in scooting transfers including car.    Personal Factors and Comorbidities  Age;Comorbidity 3+;Fitness;Past/Current Experience;Time since onset of injury/illness/exacerbation    Comorbidities  Rt TTA, C2-5 Cervical Fusion 2014, Tachypnea, encephalopathy, RLE paralysis, TIA, A-Fib, CAD, CHF, CKD stage 3, Sz disorder, hx of vertigo, knee arthroplasty Rt X 2 & Lt X 1    Examination-Activity Limitations  Caring for Others;Locomotion Level;Reach Overhead;Squat;Stairs;Stand;Transfers    Examination-Participation Restrictions  Community Activity    Stability/Clinical Decision Making  Evolving/Moderate complexity    Rehab Potential  Good    PT Frequency  2x / week    PT Duration  Other (comment)   25 weeks (6 months)   PT  Treatment/Interventions  ADLs/Self Care Home Management;DME Instruction;Gait training;Stair training;Functional mobility training;Therapeutic activities;Therapeutic exercise;Balance training;Neuromuscular re-education;Patient/family education;Prosthetic Training    PT Next Visit Plan  work towards updated STGs, transfers with sliding board / prosthesis, sit/stand  standing balance at sink    Consulted and Agree with Plan of Care  Patient;Family member/caregiver    Family Member Consulted  wife, Treavor Un       Patient will benefit from skilled therapeutic intervention in order to improve the following deficits and impairments:  Abnormal gait, Cardiopulmonary status limiting activity, Decreased activity tolerance, Decreased balance, Decreased endurance, Decreased knowledge of use of DME, Decreased mobility, Decreased range of motion, Decreased skin integrity, Decreased strength, Difficulty walking, Increased edema, Impaired flexibility, Postural dysfunction, Prosthetic Dependency, Pain  Visit Diagnosis: Unsteadiness on feet  Other abnormalities of gait and mobility  Abnormal posture  Weakness generalized  Stiffness of right knee, not elsewhere classified  Contracture of muscle, multiple sites  Cervicalgia     Problem List Patient Active Problem List   Diagnosis Date Noted  . Dehiscence of amputation stump ([place])   . Wound infection 05/09/2018  . AKI (acute kidney injury) ([place])   . S/P BKA (below knee amputation) unilateral, right ([place])   . Below-knee amputation with complication, initial encounter ([place])   . Tachypnea   . Post-operative pain   . Subacute osteomyelitis, right ankle and foot ([place])   . Paralysis of right lower extremity ([place])   . TIA (transient ischemic attack) 03/24/2018  . Encephalopathy 03/24/2018  . Cellulitis 03/15/2018  . Hypernatremia 03/15/2018  . Persistent atrial fibrillation ([place]) 11/10/2017  . Severe muscle deconditioning 11/10/2017  .  Hemorrhage 10/22/2017  . CAD S/P percutaneous coronary angioplasty 10/22/2017  . Acute hypoxemic respiratory failure ([place]) 07/17/2017  . Aspiration syndrome, subsequent encounter 07/11/2017  . Aspiration pneumonia ([place]) 07/01/2017  . Acute encephalopathy 07/01/2017  . Thrombocytopenia ([place]) 07/01/2017  . Chronic diastolic (congestive) heart failure ([place]) 07/01/2017  . Anemia of chronic disease 07/01/2017  . Troponin level elevated 07/01/2017  . Chronic atrial fibrillation 07/01/2017  . Goals of care, counseling/discussion   . Palliative care by specialist   . Acute metabolic encephalopathy 123456  . CKD (chronic kidney disease) stage 3, GFR 30-59 ml/min   . CAP (community acquired pneumonia) 04/17/2017  . Hallux rigidus, right foot 03/10/2016  . Lumbosacral spondylosis without myelopathy 10/29/2015  . Memory difficulty 09/22/2015  . Abnormality of gait 09/22/2015  . Hyperglycemia 07/06/2015  . Chronic pain 07/06/2015  . Chronic insomnia 03/30/2015  . Transient alteration of awareness 03/30/2015  . Abnormal liver function   .  Altered mental status 01/31/2015  . Essential hypertension 01/31/2015  . Constipation 01/31/2015  . Hypothyroidism 01/31/2015  . Seizure disorder ([place]) 01/31/2015  . Bladder outlet obstruction 01/31/2015  . GERD (gastroesophageal reflux disease) 01/31/2015  . Chronic back pain 01/31/2015  . Acute kidney injury ([place]) 01/31/2015    Jamey Reas PT, DPT 07/20/2019, 9:54 PM  [facility] Physical Therapy [address] [place], [place], [address] Phone: [phone]   Fax:  [phone]  Name: CHANTRY TORCHIO MRN: [id number] Date of Birth: May 22, 1933

## 2019-07-23 ENCOUNTER — Ambulatory Visit (INDEPENDENT_AMBULATORY_CARE_PROVIDER_SITE_OTHER): Payer: Medicare Other | Admitting: Physical Therapy

## 2019-07-23 ENCOUNTER — Other Ambulatory Visit: Payer: Self-pay

## 2019-07-23 ENCOUNTER — Encounter: Payer: Self-pay | Admitting: Physical Therapy

## 2019-07-23 DIAGNOSIS — R293 Abnormal posture: Secondary | ICD-10-CM | POA: Diagnosis not present

## 2019-07-23 DIAGNOSIS — M545 Low back pain: Secondary | ICD-10-CM

## 2019-07-23 DIAGNOSIS — R2689 Other abnormalities of gait and mobility: Secondary | ICD-10-CM

## 2019-07-23 DIAGNOSIS — R531 Weakness: Secondary | ICD-10-CM | POA: Diagnosis not present

## 2019-07-23 DIAGNOSIS — M25661 Stiffness of right knee, not elsewhere classified: Secondary | ICD-10-CM

## 2019-07-23 DIAGNOSIS — M6249 Contracture of muscle, multiple sites: Secondary | ICD-10-CM

## 2019-07-23 DIAGNOSIS — M542 Cervicalgia: Secondary | ICD-10-CM

## 2019-07-23 DIAGNOSIS — G8929 Other chronic pain: Secondary | ICD-10-CM

## 2019-07-23 DIAGNOSIS — R2681 Unsteadiness on feet: Secondary | ICD-10-CM

## 2019-07-23 DIAGNOSIS — Z9181 History of falling: Secondary | ICD-10-CM

## 2019-07-24 NOTE — Therapy (Signed)
[facility] [address] [place], [place], [address] Phone: [phone]   Fax:  [phone]  Physical Therapy Treatment  Patient Details  Name: [name] MRN: [id number] Date of Birth: 26-Apr-1933 Referring Provider (PT): Bevely Palmer Persons, [place]   Encounter Date: 07/23/2019  PT End of Session - 07/23/19 1401    Visit Number  18    Number of Visits  50    Date for PT Re-Evaluation  08/06/19    Authorization Type  BCBS & Medicare    PT Start Time  1152    PT Stop Time  1235    PT Time Calculation (min)  43 min    Equipment Utilized During Treatment  Gait belt    Activity Tolerance  Patient tolerated treatment well;Patient limited by fatigue    Behavior During Therapy  [employer] for tasks assessed/performed       Past Medical History:  Diagnosis Date  . Abnormality of gait 09/22/2015  . Arthritis   . Atrial fibrillation ([place])   . CAD (coronary artery disease)    Stent to RCA, Penta stent, 99% reduced to 0% 2002.  . Cancer ([place])    skin CA removed from back  . Chronic insomnia 03/30/2015  . Complication of anesthesia    trouble waking up  . GERD (gastroesophageal reflux disease)   . Hypercholesteremia   . Hypertension   . Hypothyroidism   . Memory difficulty 09/22/2015  . Osteoarthritis   . Pneumonia   . Seizures ([place])   . Sepsis ([place]) 05/2017  . Transient alteration of awareness 03/30/2015  . Vertigo    hx of    Past Surgical History:  Procedure Laterality Date  . AMPUTATION Right 03/28/2018   Procedure: AMPUTATION BELOW KNEE;  Surgeon: Newt Minion, MD;  Location: [place];  Service: Orthopedics;  Laterality: Right;  . APPLICATION OF WOUND VAC Right 01/23/2019   Procedure: Application Of  Prevena Wound Vac;  Surgeon: Newt Minion, MD;  Location: [facility];  Service: Orthopedics;  Laterality: Right;  . BACK SURGERY    . EYE SURGERY     Bilateral Cataract surgery   . HERNIA REPAIR    . I & D EXTREMITY Right 05/10/2015   Procedure:  IRRIGATION AND DEBRIDEMENT EXTREMITY;  Surgeon: Leanora Cover, MD;  Location: [facility];  Service: Orthopedics;  Laterality: Right;  . KNEE ARTHROPLASTY     right knee X 2; left knee once  . LAMINECTOMY     X 6  . LEG AMPUTATION BELOW KNEE Right 03/28/2018  . POSTERIOR CERVICAL FUSION/FORAMINOTOMY  01/28/2012   Procedure: POSTERIOR CERVICAL FUSION/FORAMINOTOMY LEVEL 3;  Surgeon: [name], MD;  Location: [facility];  Service: Neurosurgery;  Laterality: Left;  Posterior Cervical Five-Thoracic One Fusion, Arthrodesis with LEFT Cervical Seven-thoracic One Laminectomy, Foraminotomy and Resection of Synovial Cyst  . POSTERIOR CERVICAL FUSION/FORAMINOTOMY N/A 01/29/2013   Procedure: POSTERIOR CERVICAL FUSION/FORAMINOTOMY LEVEL 1 and C2-5 Posteriolateral Arthrodesis;  Surgeon: [name], MD;  Location: [facility];  Service: Neurosurgery;  Laterality: N/A;  C2-C3 Laminectomy C2-C3 posterior cervical arthrodesis  . STUMP REVISION Right 01/23/2019   Procedure: REVISION RIGHT BELOW KNEE AMPUTATION;  Surgeon: Newt Minion, MD;  Location: [facility];  Service: Orthopedics;  Laterality: Right;  . TONSILLECTOMY      There were no vitals filed for this visit.  Subjective Assessment - 07/23/19 1152    Subjective  He has not tried transfers with sliding board &  prosthesis yet. He also has not tried urinating in toilet sitting in w/c.    Patient is accompained by:  Family member   wife - [name]   Pertinent History  Rt TTA, C2-5 Cervical Fusion 2014, Tachypnea, encephalopathy, RLE paralysis, TIA, A-Fib, CAD, CHF, CKD stage 3, Sz disorder, hx of vertigo, knee arthroplasty Rt X 2 & Lt X 1    Limitations  Lifting;Standing;Walking;House hold activities    Patient Stated Goals  To get prosthesis, go to bathroom alone, get around house, get out in community.    Currently in Pain?  Yes    Pain Score  5     Pain Location  Other (Comment)   general OA pain   Pain Descriptors /  Indicators  Aching;Sore    Pain Type  Chronic pain    Pain Onset  More than a month ago    Pain Frequency  Constant    Aggravating Factors   arthritis    Pain Relieving Factors  medication    Pain Onset  More than a month ago                        [facility] Adult PT Treatment/Exercise - 07/23/19 1155      Transfers   Transfers  Sit to Stand;Stand to Sit;Anterior-Posterior Transfer;Lateral/Scoot Transfers;Stand Pivot Transfers    Sit to Stand  2: Max assist;With upper extremity assist;With armrests;From chair/3-in-1;Other (comment)   To sink   Sit to Stand Details  Tactile cues for weight shifting;Visual cues for safe use of DME/AE;Verbal cues for technique;Verbal cues for sequencing;Verbal cues for safe use of DME/AE    Stand to Sit  3: Mod assist;With upper extremity assist;With armrests;To chair/3-in-1;Other (comment)   from RW   Stand to Sit Details (indicate cue type and reason)  Tactile cues for weight shifting;Visual cues for safe use of DME/AE;Verbal cues for technique;Verbal cues for sequencing;Verbal cues for safe use of DME/AE      Self-Care   ADL's  PT educated on urinating in toilet seated in w/c.  PT instructed while performing with patient. Wife & pt verbalized that this can help with toileting at home & if needed in community.              Neuro Re-ed    Neuro Re-ed Details   --      Knee/Hip Exercises: Aerobic   Nustep  Level 5 with BUEs & BLEs 5 minutes      Prosthetics   Prosthetic Care Comments   Donning upon arising after toileting, removing for nap with shrinker wear, redonning after nap for 4-5 hours.  Transferring out of w/c to household chairs like recliner & toilet using sliding board & prosthesis.      Current prosthetic wear tolerance (days/week)   daily     Current prosthetic wear tolerance (#hours/day)   4-5 hrs 1-2x/day, PT still recommending 2x/day    Current prosthetic weight-bearing tolerance (hours/day)   no pain reported with today's  attempted weight bearing activities     Residual limb condition   No open area noted on previously wound area, normal redness / pink in pressure tolerant areas of socket    Education Provided  Skin check;Residual limb care;Proper Donning;Proper Doffing;Proper wear schedule/adjustment;Other (comment);Ply sock cleaning;Correct ply sock adjustment   see prosthetic care comments   Person(s) Educated  Patient;Spouse    Education Method  Explanation;Verbal cues    Education Method  Verbalized understanding;Returned demonstration;Tactile cues  required;Verbal cues required;Needs further instruction    Donning Prosthesis  Minimal assist   patient minA & wife supervision /cues   Doffing Prosthesis  Supervision               PT Short Term Goals - 07/14/19 1256      PT SHORT TERM GOAL #1   Title  Patient donnes prosthesis without assistance with verbal cues only. (All [facility] STGs: 08/06/2019)    Time  1    Period  Months    Status  New    [facility] Date  08/06/19      PT SHORT TERM GOAL #2   Title  Sit to/from stand at sink with minA / wife able to assist and stand for 3 minutes with min guard.    Time  1    Period  Months    Status  On-going    [facility] Date  08/06/19      PT SHORT TERM GOAL #3   Title  Patient sit to/from stand w/c to RW with modA    Time  1    Period  Months    Status  New    [facility] Date  08/06/19      PT SHORT TERM GOAL #4   Title  Patient tolerates prosthesis wear >8 hours total /day without wound issues.    Time  1    Period  Months    Status  Revised    [facility] Date  08/06/19      PT SHORT TERM GOAL #5   Title  Patient ambulates 10' in parallel bars with modA.    Time  1    Period  Months    Status  On-going    Target Date  08/06/19        PT Long Term Goals - 05/18/19 1244      PT LONG TERM GOAL #1   Title  Patient and wife verbalize & demonstrate understanding of proper prosthetic care to enable safe utilization of prosthesis. (All LTGs [facility]  Date: 11/03/2019)    Time  6    Period  Months    Status  On-going    Target Date  11/03/19      PT LONG TERM GOAL #2   Title  Patient tolerates wear of prosthesis >90% of awake hours without skin or residual limb pain issues to enable function during his day.    Time  6    Period  Months    Status  On-going    Target Date  11/03/19      PT LONG TERM GOAL #3   Title  Standing balance with RW support: maintains upright 2 minutes, reaches 5" anteriorly and scans environment with supervision.    Time  6    Period  Months    Status  On-going    Target Date  11/03/19      PT LONG TERM GOAL #4   Title  Patient ambulates 100' with RW & prosthesis with wife's assistance / supervision and turns 180* to position to sit safely.    Time  6    Period  Months    Status  On-going    Target Date  11/03/19      PT LONG TERM GOAL #5   Title  Sit to/from stand and stand-pivot transfers with RW & prosthesis with supervision.    Time  6    Period  Months    Status  On-going  [facility] Date  11/03/19            Plan - 07/23/19 1402    Clinical Impression Statement  PT is instructing patient & spouse in increased prsothesis wear, transfers with prosthesis & toileting in toilet. They verbalize understanding but continue to be gaurded about carryover outside of PT.    Personal Factors and Comorbidities  Age;Comorbidity 3+;Fitness;Past/Current Experience;Time since onset of injury/illness/exacerbation    Comorbidities  Rt TTA, C2-5 Cervical Fusion 2014, Tachypnea, encephalopathy, RLE paralysis, TIA, A-Fib, CAD, CHF, CKD stage 3, Sz disorder, hx of vertigo, knee arthroplasty Rt X 2 & Lt X 1    Examination-Activity Limitations  Caring for Others;Locomotion Level;Reach Overhead;Squat;Stairs;Stand;Transfers    Examination-Participation Restrictions  Community Activity    Stability/Clinical Decision Making  Evolving/Moderate complexity    Rehab Potential  Good    PT Frequency  2x / week    PT  Duration  Other (comment)   25 weeks (6 months)   PT Treatment/Interventions  ADLs/Self Care Home Management;DME Instruction;Gait training;Stair training;Functional mobility training;Therapeutic activities;Therapeutic exercise;Balance training;Neuromuscular re-education;Patient/family education;Prosthetic Training    PT Next Visit Plan  work towards updated STGs, transfers with sliding board / prosthesis, sit/stand  standing balance at sink    Consulted and Agree with Plan of Care  Patient;Family member/caregiver    Family Member Consulted  wife, Tyerell [place]       Patient will benefit from skilled therapeutic intervention in order to improve the following deficits and impairments:  Abnormal gait, Cardiopulmonary status limiting activity, Decreased activity tolerance, Decreased balance, Decreased endurance, Decreased knowledge of use of DME, Decreased mobility, Decreased range of motion, Decreased skin integrity, Decreased strength, Difficulty walking, Increased edema, Impaired flexibility, Postural dysfunction, Prosthetic Dependency, Pain  Visit Diagnosis: Other abnormalities of gait and mobility  Unsteadiness on feet  Abnormal posture  Weakness generalized  Stiffness of right knee, not elsewhere classified  Contracture of muscle, multiple sites  Cervicalgia  History of fall  Chronic midline low back pain without sciatica     Problem List Patient Active Problem List   Diagnosis Date Noted  . Dehiscence of amputation stump ([place])   . Wound infection 05/09/2018  . AKI (acute kidney injury) ([place])   . S/P BKA (below knee amputation) unilateral, right ([place])   . Below-knee amputation with complication, initial encounter ([place])   . Tachypnea   . Post-operative pain   . Subacute osteomyelitis, right ankle and foot ([place])   . Paralysis of right lower extremity ([place])   . TIA (transient ischemic attack) 03/24/2018  . Encephalopathy 03/24/2018  . Cellulitis 03/15/2018  .  Hypernatremia 03/15/2018  . Persistent atrial fibrillation ([place]) 11/10/2017  . Severe muscle deconditioning 11/10/2017  . Hemorrhage 10/22/2017  . CAD S/P percutaneous coronary angioplasty 10/22/2017  . Acute hypoxemic respiratory failure ([place]) 07/17/2017  . Aspiration syndrome, subsequent encounter 07/11/2017  . Aspiration pneumonia ([place]) 07/01/2017  . Acute encephalopathy 07/01/2017  . Thrombocytopenia ([place]) 07/01/2017  . Chronic diastolic (congestive) heart failure ([place]) 07/01/2017  . Anemia of chronic disease 07/01/2017  . Troponin level elevated 07/01/2017  . Chronic atrial fibrillation 07/01/2017  . Goals of care, counseling/discussion   . Palliative care by specialist   . Acute metabolic encephalopathy [id number]  . CKD (chronic kidney disease) stage 3, GFR 30-59 ml/min   . CAP (community acquired pneumonia) 04/17/2017  . Hallux rigidus, right foot 03/10/2016  . Lumbosacral spondylosis without myelopathy 10/29/2015  . Memory difficulty 09/22/2015  . Abnormality of gait 09/22/2015  . Hyperglycemia  07/06/2015  . Chronic pain 07/06/2015  . Chronic insomnia 03/30/2015  . Transient alteration of awareness 03/30/2015  . Abnormal liver function   . Altered mental status 01/31/2015  . Essential hypertension 01/31/2015  . Constipation 01/31/2015  . Hypothyroidism 01/31/2015  . Seizure disorder ([place]) 01/31/2015  . Bladder outlet obstruction 01/31/2015  . GERD (gastroesophageal reflux disease) 01/31/2015  . Chronic back pain 01/31/2015  . Acute kidney injury ([place]) 01/31/2015    Jamey Reas, PT, DPT 07/24/2019, 10:40 AM  [facility] [address] [place], [place], [address] Phone: [phone]   Fax:  [phone]  Name: [name] MRN: [id number] Date of Birth: 08-22-1933

## 2019-07-28 ENCOUNTER — Ambulatory Visit (INDEPENDENT_AMBULATORY_CARE_PROVIDER_SITE_OTHER): Payer: Medicare Other | Admitting: Physical Therapy

## 2019-07-28 ENCOUNTER — Other Ambulatory Visit: Payer: Self-pay

## 2019-07-28 ENCOUNTER — Encounter: Payer: Self-pay | Admitting: Physical Therapy

## 2019-07-28 DIAGNOSIS — R293 Abnormal posture: Secondary | ICD-10-CM

## 2019-07-28 DIAGNOSIS — M545 Low back pain, unspecified: Secondary | ICD-10-CM

## 2019-07-28 DIAGNOSIS — R2689 Other abnormalities of gait and mobility: Secondary | ICD-10-CM | POA: Diagnosis not present

## 2019-07-28 DIAGNOSIS — R2681 Unsteadiness on feet: Secondary | ICD-10-CM | POA: Diagnosis not present

## 2019-07-28 DIAGNOSIS — M25661 Stiffness of right knee, not elsewhere classified: Secondary | ICD-10-CM

## 2019-07-28 DIAGNOSIS — R531 Weakness: Secondary | ICD-10-CM

## 2019-07-28 DIAGNOSIS — M542 Cervicalgia: Secondary | ICD-10-CM

## 2019-07-28 DIAGNOSIS — G8929 Other chronic pain: Secondary | ICD-10-CM

## 2019-07-28 DIAGNOSIS — Z9181 History of falling: Secondary | ICD-10-CM

## 2019-07-28 DIAGNOSIS — M6249 Contracture of muscle, multiple sites: Secondary | ICD-10-CM

## 2019-07-29 NOTE — Therapy (Signed)
[facility] Physical Therapy [address] [place], [place], [address] Phone: [phone]   Fax:  [phone]  Physical Therapy Treatment  Patient Details  Name: [name] MRN: [id number] Date of Birth: 10/01/1933 Referring Provider (PT): Bevely Palmer Persons, [place]   Encounter Date: 07/28/2019  PT End of Session - 07/28/19 1158    Visit Number  19    Number of Visits  50    Date for PT Re-Evaluation  08/06/19    Authorization Type  BCBS & Medicare    PT Start Time  1152    PT Stop Time  1234    PT Time Calculation (min)  42 min    Equipment Utilized During Treatment  Gait belt    Activity Tolerance  Patient tolerated treatment well;Patient limited by fatigue    Behavior During Therapy  [facility] for tasks assessed/performed       Past Medical History:  Diagnosis Date  . Abnormality of gait 09/22/2015  . Arthritis   . Atrial fibrillation ([place])   . CAD (coronary artery disease)    Stent to RCA, Penta stent, 99% reduced to 0% 2002.  . Cancer ([place])    skin CA removed from back  . Chronic insomnia 03/30/2015  . Complication of anesthesia    trouble waking up  . GERD (gastroesophageal reflux disease)   . Hypercholesteremia   . Hypertension   . Hypothyroidism   . Memory difficulty 09/22/2015  . Osteoarthritis   . Pneumonia   . Seizures ([place])   . Sepsis ([place]) 05/2017  . Transient alteration of awareness 03/30/2015  . Vertigo    hx of    Past Surgical History:  Procedure Laterality Date  . AMPUTATION Right 03/28/2018   Procedure: AMPUTATION BELOW KNEE;  Surgeon: Newt Minion, MD;  Location: [place];  Service: Orthopedics;  Laterality: Right;  . APPLICATION OF WOUND VAC Right 01/23/2019   Procedure: Application Of  Prevena Wound Vac;  Surgeon: Newt Minion, MD;  Location: [place];  Service: Orthopedics;  Laterality: Right;  . BACK SURGERY    . EYE SURGERY     Bilateral Cataract surgery   . HERNIA REPAIR    . I & D EXTREMITY Right 05/10/2015   Procedure:  IRRIGATION AND DEBRIDEMENT EXTREMITY;  Surgeon: Leanora Cover, MD;  Location: [place];  Service: Orthopedics;  Laterality: Right;  . KNEE ARTHROPLASTY     right knee X 2; left knee once  . LAMINECTOMY     X 6  . LEG AMPUTATION BELOW KNEE Right 03/28/2018  . POSTERIOR CERVICAL FUSION/FORAMINOTOMY  01/28/2012   Procedure: POSTERIOR CERVICAL FUSION/FORAMINOTOMY LEVEL 3;  Surgeon: [name], MD;  Location: [place] [facility];  Service: Neurosurgery;  Laterality: Left;  Posterior Cervical Five-Thoracic One Fusion, Arthrodesis with LEFT Cervical Seven-thoracic One Laminectomy, Foraminotomy and Resection of Synovial Cyst  . POSTERIOR CERVICAL FUSION/FORAMINOTOMY N/A 01/29/2013   Procedure: POSTERIOR CERVICAL FUSION/FORAMINOTOMY LEVEL 1 and C2-5 Posteriolateral Arthrodesis;  Surgeon: [name], MD;  Location: [place] [facility];  Service: Neurosurgery;  Laterality: N/A;  C2-C3 Laminectomy C2-C3 posterior cervical arthrodesis  . STUMP REVISION Right 01/23/2019   Procedure: REVISION RIGHT BELOW KNEE AMPUTATION;  Surgeon: Newt Minion, MD;  Location: [place];  Service: Orthopedics;  Laterality: Right;  . TONSILLECTOMY      There were no vitals filed for this visit.  Subjective Assessment - 07/28/19 1159    Subjective  He is wearing prosthesis from arising to bedtime. They  noticed a red spot last night.    Patient is accompained by:  Family member   wife - [name]   Pertinent History  Rt TTA, C2-5 Cervical Fusion 2014, Tachypnea, encephalopathy, RLE paralysis, TIA, A-Fib, CAD, CHF, CKD stage 3, Sz disorder, hx of vertigo, knee arthroplasty Rt X 2 & Lt X 1    Limitations  Lifting;Standing;Walking;House hold activities    Patient Stated Goals  To get prosthesis, go to bathroom alone, get around house, get out in community.    Currently in Pain?  Yes    Pain Score  5     Pain Location  Other (Comment)   left shoulder & right hip   Pain Orientation  Right;Left    Pain Descriptors /  Indicators  Aching    Pain Type  Chronic pain;Other (Comment)   arthritic   Pain Onset  More than a month ago    Pain Frequency  Constant    Aggravating Factors   arthritis    Pain Relieving Factors  medication & lidocaine patch    Pain Onset  More than a month ago                        [facility] Adult PT Treatment/Exercise - 07/28/19 1155      Transfers   Transfers  Sit to Stand;Stand to Sit;Anterior-Posterior Transfer;Lateral/Scoot Transfers;Stand Pivot Transfers    Sit to Stand  3: Mod assist;With upper extremity assist;With armrests;From chair/3-in-1;Other (comment)   to RW   Sit to Stand Details  Tactile cues for weight shifting;Visual cues for safe use of DME/AE;Verbal cues for technique;Verbal cues for sequencing;Verbal cues for safe use of DME/AE    Stand to Sit  4: Min assist;With upper extremity assist;With armrests;To chair/3-in-1;Other (comment)   from RW   Stand to Sit Details (indicate cue type and reason)  Tactile cues for weight shifting;Visual cues for safe use of DME/AE;Verbal cues for technique;Verbal cues for sequencing;Verbal cues for safe use of DME/AE    Squat Pivot Transfers  4: Min assist;With upper extremity assistance   w/c <> Nustep   Squat Pivot Transfer Details (indicate cue type and reason)  verbal & tactile cues on technique esp. weight shift.       Ambulation/Gait   Ambulation/Gait  Yes    Ambulation/Gait Assistance  2: Max assist   2 people for safety   Ambulation/Gait Assistance Details  manual/tactile, verbal & demo cues on sequence, upright posture & weight shift      Ambulation Distance (Feet)  10 Feet   10' X 2 straight path, 5' x 2 w/90* pivot turn to sit   Assistive device  Prosthesis;Rolling walker    Ambulation Surface  Level;Indoor      Neuro Re-ed    Neuro Re-ed Details   standing balance static with RW support 60 seconds with min guard      Knee/Hip Exercises: Aerobic   Nustep  Level 5 with BUEs & BLEs 5 minutes       Prosthetics   Prosthetic Care Comments   Limb volume can change during day as weighting/unweighting prosthesis can push fluid out of limb.  Remove prosthesis after lunch to dry limb/ liner and check ply sock fit.      Current prosthetic wear tolerance (days/week)   daily     Current prosthetic wear tolerance (#hours/day)   most of awake hours.     Current prosthetic weight-bearing tolerance (hours/day)   no pain  reported with today's attempted weight bearing activities     Residual limb condition   No redness noted. PT educated pt & wife probably from limb volume change during day with weighting/unweighting and no change in ply socks. Both verbalized understanding.   No open area noted on previously wound area, normal redness / pink in pressure tolerant areas of socket    Education Provided  Skin check;Residual limb care;Proper Donning;Proper Doffing;Proper wear schedule/adjustment;Other (comment);Ply sock cleaning;Correct ply sock adjustment   see prosthetic care comments   Person(s) Educated  Patient;Spouse    Education Method  Explanation;Demonstration;Tactile cues;Verbal cues    Education Method  Verbalized understanding;Needs further instruction    Donning Prosthesis  Minimal assist    Doffing Prosthesis  Supervision               PT Short Term Goals - 07/14/19 1256      PT SHORT TERM GOAL #1   Title  Patient donnes prosthesis without assistance with verbal cues only. (All [facility] STGs: 08/06/2019)    Time  1    Period  Months    Status  New    Target Date  08/06/19      PT SHORT TERM GOAL #2   Title  Sit to/from stand at sink with minA / wife able to assist and stand for 3 minutes with min guard.    Time  1    Period  Months    Status  On-going    Target Date  08/06/19      PT SHORT TERM GOAL #3   Title  Patient sit to/from stand w/c to RW with modA    Time  1    Period  Months    Status  New    Target Date  08/06/19      PT SHORT TERM GOAL #4   Title  Patient  tolerates prosthesis wear >8 hours total /day without wound issues.    Time  1    Period  Months    Status  Revised    Target Date  08/06/19      PT SHORT TERM GOAL #5   Title  Patient ambulates 10' in parallel bars with modA.    Time  1    Period  Months    Status  On-going    Target Date  08/06/19        PT Long Term Goals - 05/18/19 1244      PT LONG TERM GOAL #1   Title  Patient and wife verbalize & demonstrate understanding of proper prosthetic care to enable safe utilization of prosthesis. (All LTGs Target Date: 11/03/2019)    Time  6    Period  Months    Status  On-going    Target Date  11/03/19      PT LONG TERM GOAL #2   Title  Patient tolerates wear of prosthesis >90% of awake hours without skin or residual limb pain issues to enable function during his day.    Time  6    Period  Months    Status  On-going    Target Date  11/03/19      PT LONG TERM GOAL #3   Title  Standing balance with RW support: maintains upright 2 minutes, reaches 5" anteriorly and scans environment with supervision.    Time  6    Period  Months    Status  On-going    Target Date  11/03/19  PT LONG TERM GOAL #4   Title  Patient ambulates 100' with RW & prosthesis with wife's assistance / supervision and turns 180* to position to sit safely.    Time  6    Period  Months    Status  On-going    Target Date  11/03/19      PT LONG TERM GOAL #5   Title  Sit to/from stand and stand-pivot transfers with RW & prosthesis with supervision.    Time  6    Period  Months    Status  On-going    Target Date  11/03/19            Plan - 07/28/19 1158    Clinical Impression Statement  Patient improved transfers sit to/from stand and static standing with RW instead of sink. PT able to progress gait today. He required less assistance as he followed PT instructions better today. PT progressed gait during session to turning 90* to sit over being followed by w/c on straight path.    Personal  Factors and Comorbidities  Age;Comorbidity 3+;Fitness;Past/Current Experience;Time since onset of injury/illness/exacerbation    Comorbidities  Rt TTA, C2-5 Cervical Fusion 2014, Tachypnea, encephalopathy, RLE paralysis, TIA, A-Fib, CAD, CHF, CKD stage 3, Sz disorder, hx of vertigo, knee arthroplasty Rt X 2 & Lt X 1    Examination-Activity Limitations  Caring for Others;Locomotion Level;Reach Overhead;Squat;Stairs;Stand;Transfers    Examination-Participation Restrictions  Community Activity    Stability/Clinical Decision Making  Evolving/Moderate complexity    Rehab Potential  Good    PT Frequency  2x / week    PT Duration  Other (comment)   25 weeks (6 months)   PT Treatment/Interventions  ADLs/Self Care Home Management;DME Instruction;Gait training;Stair training;Functional mobility training;Therapeutic activities;Therapeutic exercise;Balance training;Neuromuscular re-education;Patient/family education;Prosthetic Training    PT Next Visit Plan  check updated STGs, continue to encourage transfers with sliding board / prosthesis,    Consulted and Agree with Plan of Care  Patient;Family member/caregiver    Family Member Consulted  wife, Varick Zoto       Patient will benefit from skilled therapeutic intervention in order to improve the following deficits and impairments:  Abnormal gait, Cardiopulmonary status limiting activity, Decreased activity tolerance, Decreased balance, Decreased endurance, Decreased knowledge of use of DME, Decreased mobility, Decreased range of motion, Decreased skin integrity, Decreased strength, Difficulty walking, Increased edema, Impaired flexibility, Postural dysfunction, Prosthetic Dependency, Pain  Visit Diagnosis: Other abnormalities of gait and mobility  Unsteadiness on feet  Abnormal posture  Weakness generalized  History of fall  Stiffness of right knee, not elsewhere classified  Contracture of muscle, multiple sites  Cervicalgia  Chronic  midline low back pain without sciatica     Problem List Patient Active Problem List   Diagnosis Date Noted  . Dehiscence of amputation stump ([place])   . Wound infection 05/09/2018  . AKI (acute kidney injury) ([place])   . S/P BKA (below knee amputation) unilateral, right ([place])   . Below-knee amputation with complication, initial encounter ([place])   . Tachypnea   . Post-operative pain   . Subacute osteomyelitis, right ankle and foot ([place])   . Paralysis of right lower extremity ([place])   . TIA (transient ischemic attack) 03/24/2018  . Encephalopathy 03/24/2018  . Cellulitis 03/15/2018  . Hypernatremia 03/15/2018  . Persistent atrial fibrillation ([place]) 11/10/2017  . Severe muscle deconditioning 11/10/2017  . Hemorrhage 10/22/2017  . CAD S/P percutaneous coronary angioplasty 10/22/2017  . Acute hypoxemic respiratory failure ([place]) 07/17/2017  .  Aspiration syndrome, subsequent encounter 07/11/2017  . Aspiration pneumonia ([place]) 07/01/2017  . Acute encephalopathy 07/01/2017  . Thrombocytopenia ([place]) 07/01/2017  . Chronic diastolic (congestive) heart failure ([place]) 07/01/2017  . Anemia of chronic disease 07/01/2017  . Troponin level elevated 07/01/2017  . Chronic atrial fibrillation 07/01/2017  . Goals of care, counseling/discussion   . Palliative care by specialist   . Acute metabolic encephalopathy [id number]  . CKD (chronic kidney disease) stage 3, GFR 30-59 ml/min   . CAP (community acquired pneumonia) 04/17/2017  . Hallux rigidus, right foot 03/10/2016  . Lumbosacral spondylosis without myelopathy 10/29/2015  . Memory difficulty 09/22/2015  . Abnormality of gait 09/22/2015  . Hyperglycemia 07/06/2015  . Chronic pain 07/06/2015  . Chronic insomnia 03/30/2015  . Transient alteration of awareness 03/30/2015  . Abnormal liver function   . Altered mental status 01/31/2015  . Essential hypertension 01/31/2015  . Constipation 01/31/2015  . Hypothyroidism 01/31/2015  . Seizure disorder  ([place]) 01/31/2015  . Bladder outlet obstruction 01/31/2015  . GERD (gastroesophageal reflux disease) 01/31/2015  . Chronic back pain 01/31/2015  . Acute kidney injury ([place]) 01/31/2015    Jamey Reas PT, DPT 07/29/2019, 8:27 AM  [facility] Physical Therapy [address] [place], [place], [address] Phone: [phone]   Fax:  [phone]  Name: [name] MRN: [id number] Date of Birth: 1933/12/05

## 2019-07-30 ENCOUNTER — Encounter: Payer: Self-pay | Admitting: Physical Therapy

## 2019-07-30 ENCOUNTER — Ambulatory Visit (INDEPENDENT_AMBULATORY_CARE_PROVIDER_SITE_OTHER): Payer: Medicare Other | Admitting: Physical Therapy

## 2019-07-30 ENCOUNTER — Other Ambulatory Visit: Payer: Self-pay

## 2019-07-30 DIAGNOSIS — M25661 Stiffness of right knee, not elsewhere classified: Secondary | ICD-10-CM

## 2019-07-30 DIAGNOSIS — M6249 Contracture of muscle, multiple sites: Secondary | ICD-10-CM

## 2019-07-30 DIAGNOSIS — R293 Abnormal posture: Secondary | ICD-10-CM | POA: Diagnosis not present

## 2019-07-30 DIAGNOSIS — M542 Cervicalgia: Secondary | ICD-10-CM

## 2019-07-30 DIAGNOSIS — R2689 Other abnormalities of gait and mobility: Secondary | ICD-10-CM | POA: Diagnosis not present

## 2019-07-30 DIAGNOSIS — R2681 Unsteadiness on feet: Secondary | ICD-10-CM

## 2019-07-30 DIAGNOSIS — R531 Weakness: Secondary | ICD-10-CM

## 2019-07-30 NOTE — Therapy (Signed)
[place], [place], [address] Phone: [phone]   Fax:  [phone]  Physical Therapy Treatment  Patient Details  Name: [name] MRN: [id number] Date of Birth: 1933-09-01 Referring Provider (PT): Bevely Palmer Persons, [place]   Encounter Date: 07/30/2019   Progress Note Reporting Period 06/25/2019 to 07/30/2019  See note below for Objective Data and Assessment of Progress/Goals.       PT End of Session - 07/30/19 1152    Visit Number  20    Number of Visits  50    Date for PT Re-Evaluation  08/06/19    Authorization Type  BCBS & Medicare    PT Start Time  1149    PT Stop Time  1235    PT Time Calculation (min)  46 min    Equipment Utilized During Treatment  Gait belt    Activity Tolerance  Patient tolerated treatment well;Patient limited by fatigue    Behavior During Therapy  WFL for tasks assessed/performed       Past Medical History:  Diagnosis Date  . Abnormality of gait 09/22/2015  . Arthritis   . Atrial fibrillation ([place])   . CAD (coronary artery disease)    Stent to RCA, Penta stent, 99% reduced to 0% 2002.  . Cancer ([place])    skin CA removed from back  . Chronic insomnia 03/30/2015  . Complication of anesthesia    trouble waking up  . GERD (gastroesophageal reflux disease)   . Hypercholesteremia   . Hypertension   . Hypothyroidism   . Memory difficulty 09/22/2015  . Osteoarthritis   . Pneumonia   . Seizures ([place])   . Sepsis ([place]) 05/2017  . Transient alteration of awareness 03/30/2015  . Vertigo    hx of    Past Surgical History:  Procedure Laterality Date  . AMPUTATION Right 03/28/2018   Procedure: AMPUTATION BELOW KNEE;  Surgeon: Newt Minion, MD;  Location: [facility];  Service: Orthopedics;  Laterality: Right;  . APPLICATION OF WOUND VAC Right 01/23/2019   Procedure: Application Of  Prevena Wound Vac;  Surgeon: Newt Minion, MD;  Location: [facility];  Service: Orthopedics;  Laterality: Right;   . BACK SURGERY    . EYE SURGERY     Bilateral Cataract surgery   . HERNIA REPAIR    . I & D EXTREMITY Right 05/10/2015   Procedure: IRRIGATION AND DEBRIDEMENT EXTREMITY;  Surgeon: Leanora Cover, MD;  Location: [facility];  Service: Orthopedics;  Laterality: Right;  . KNEE ARTHROPLASTY     right knee X 2; left knee once  . LAMINECTOMY     X 6  . LEG AMPUTATION BELOW KNEE Right 03/28/2018  . POSTERIOR CERVICAL FUSION/FORAMINOTOMY  01/28/2012   Procedure: POSTERIOR CERVICAL FUSION/FORAMINOTOMY LEVEL 3;  Surgeon: [name], MD;  Location: [facility];  Service: Neurosurgery;  Laterality: Left;  Posterior Cervical Five-Thoracic One Fusion, Arthrodesis with LEFT Cervical Seven-thoracic One Laminectomy, Foraminotomy and Resection of Synovial Cyst  . POSTERIOR CERVICAL FUSION/FORAMINOTOMY N/A 01/29/2013   Procedure: POSTERIOR CERVICAL FUSION/FORAMINOTOMY LEVEL 1 and C2-5 Posteriolateral Arthrodesis;  Surgeon: [name], MD;  Location: [facility];  Service: Neurosurgery;  Laterality: N/A;  C2-C3 Laminectomy C2-C3 posterior cervical arthrodesis  . STUMP REVISION Right 01/23/2019   Procedure: REVISION RIGHT BELOW KNEE AMPUTATION;  Surgeon: Newt Minion, MD;  Location: [facility];  Service: Orthopedics;  Laterality: Right;  . TONSILLECTOMY      There were no  vitals filed for this visit.  Subjective Assessment - 07/30/19 1149    Subjective  He is very tired the day after PT. He has transferred some bed to w/c with sliding board & prosthesis as PT recommended.    Patient is accompained by:  Family member   wife - [name]   Pertinent History  Rt TTA, C2-5 Cervical Fusion 2014, Tachypnea, encephalopathy, RLE paralysis, TIA, A-Fib, CAD, CHF, CKD stage 3, Sz disorder, hx of vertigo, knee arthroplasty Rt X 2 & Lt X 1    Limitations  Lifting;Standing;Walking;House hold activities    Patient Stated Goals  To get prosthesis, go to bathroom alone, get around house, get out in  community.    Currently in Pain?  Yes    Pain Score  5     Pain Location  --   arthritic pain.   Pain Orientation  Right;Left    Pain Descriptors / Indicators  Aching    Pain Type  Chronic pain    Pain Onset  More than a month ago    Pain Frequency  Constant    Aggravating Factors   stiffness & certain movements    Pain Relieving Factors  medications.    Pain Onset  More than a month ago                        [facility] Adult PT Treatment/Exercise - 07/30/19 1150      Transfers   Transfers  Sit to Stand;Stand to Sit;Lateral/Scoot Transfers;Squat Pivot Transfers    Sit to Stand  3: Mod assist;With upper extremity assist;With armrests;From chair/3-in-1;Other (comment)   to RW   Sit to Stand Details  Tactile cues for weight shifting;Visual cues for safe use of DME/AE;Verbal cues for technique;Verbal cues for sequencing;Verbal cues for safe use of DME/AE    Stand to Sit  4: Min guard;4: Min assist;To chair/3-in-1;Other (comment);With armrests;With upper extremity assist   from RW   Stand to Sit Details (indicate cue type and reason)  Tactile cues for weight shifting;Visual cues for safe use of DME/AE;Verbal cues for technique;Verbal cues for sequencing;Verbal cues for safe use of DME/AE    Squat Pivot Transfers  4: Min assist;With upper extremity assistance   w/c <> Nustep & w/c <> Leg press     Ambulation/Gait   Ambulation/Gait  Yes    Ambulation/Gait Assistance  2: Max assist   1 person / PT assist   Ambulation/Gait Assistance Details  manual assist on RW & prosthesis movement / stabilization, wt shift over prosthesis and verbal cues for upright posture / looking forward & sequence.     Ambulation Distance (Feet)  10 Feet   10' X 2 + 90* turn to position to sit   Assistive device  Prosthesis;Rolling walker    Gait Pattern  Step-to pattern;Decreased step length - left;Decreased stance time - right;Decreased stride length;Decreased hip/knee flexion - right;Decreased  hip/knee flexion - left;Decreased weight shift to right;Right hip hike;Right foot flat;Right flexed knee in stance;Left flexed knee in stance;Antalgic;Lateral hip instability;Trunk flexed;Poor foot clearance - right    Ambulation Surface  Level;Indoor      Neuro Re-ed    Neuro Re-ed Details   standing balance static with RW support 60 seconds with min guard.   And sink 3 minutes with min guard.       Knee/Hip Exercises: Aerobic   Nustep  Level 5 with BUEs & BLEs 5 minutes      Knee/Hip  Exercises: Machines for Strengthening   Cybex Leg Press  shuttle leg press back at 45* - BLEs 62# 10 reps, RLE 12# 5 reps, LLE 25# 5 reps  tactile & verbal cues for full range and controlled motion.       Prosthetics   Prosthetic Care Comments   Limb volume can change during day as weighting/unweighting prosthesis can push fluid out of limb.  Remove prosthesis after lunch to dry limb/ liner and check ply sock fit.      Current prosthetic wear tolerance (days/week)   daily     Current prosthetic wear tolerance (#hours/day)   most of awake hours.     Current prosthetic weight-bearing tolerance (hours/day)   no pain reported with today's attempted weight bearing activities     Residual limb condition   No redness noted. PT educated pt & wife probably from limb volume change during day with weighting/unweighting and no change in ply socks. Both verbalized understanding.   No open area noted on previously wound area, normal redness / pink in pressure tolerant areas of socket    Education Provided  Skin check;Residual limb care;Proper Donning;Proper wear schedule/adjustment;Other (comment);Correct ply sock adjustment   see prosthetic care comments   Person(s) Educated  Patient;Spouse    Education Method  Explanation;Verbal cues;Demonstration;Tactile cues    Education Method  Verbalized understanding;Needs further instruction    Donning Prosthesis  Supervision   significantly increased time   Doffing Prosthesis   Supervision               PT Short Term Goals - 07/30/19 1256      PT SHORT TERM GOAL #1   Title  Patient donnes prosthesis without assistance with verbal cues only. (All [facility] STGs: 08/06/2019)    Baseline  Partially MET 07/30/2019 He donnes without assistance with verbal cues only most times that he is attempting. But his wife does it most of time because it takes him so long.    Time  1    Period  Months    Status  Partially Met    Target Date  08/06/19      PT SHORT TERM GOAL #2   Title  Sit to/from stand at sink with minA / wife able to assist and stand for 3 minutes with min guard.    Baseline  MET 07/30/2019    Time  1    Period  Months    Status  Achieved    Target Date  08/06/19      PT SHORT TERM GOAL #3   Title  Patient sit to/from stand w/c to RW with modA    Baseline  MET 07/30/2019    Time  1    Period  Months    Status  Achieved    Target Date  08/06/19      PT SHORT TERM GOAL #4   Title  Patient tolerates prosthesis wear >8 hours total /day without wound issues.    Baseline  MET 07/30/2019    Time  1    Period  Months    Status  Achieved    Target Date  08/06/19      PT SHORT TERM GOAL #5   Title  Patient ambulates 10' in parallel bars with modA.    Baseline  MET 07/30/2019    Time  1    Period  Months    Status  Achieved    Target Date  08/06/19  PT Long Term Goals - 05/18/19 1244      PT LONG TERM GOAL #1   Title  Patient and wife verbalize & demonstrate understanding of proper prosthetic care to enable safe utilization of prosthesis. (All LTGs Target Date: 11/03/2019)    Time  6    Period  Months    Status  On-going    Target Date  11/03/19      PT LONG TERM GOAL #2   Title  Patient tolerates wear of prosthesis >90% of awake hours without skin or residual limb pain issues to enable function during his day.    Time  6    Period  Months    Status  On-going    Target Date  11/03/19      PT LONG TERM GOAL #3   Title  Standing  balance with RW support: maintains upright 2 minutes, reaches 5" anteriorly and scans environment with supervision.    Time  6    Period  Months    Status  On-going    Target Date  11/03/19      PT LONG TERM GOAL #4   Title  Patient ambulates 100' with RW & prosthesis with wife's assistance / supervision and turns 180* to position to sit safely.    Time  6    Period  Months    Status  On-going    Target Date  11/03/19      PT LONG TERM GOAL #5   Title  Sit to/from stand and stand-pivot transfers with RW & prosthesis with supervision.    Time  6    Period  Months    Status  On-going    Target Date  11/03/19            Plan - 07/30/19 1153    Clinical Impression Statement  Patient met STGs set for this 30-day period. He was able to ambulate with only 1 PT assist today for first time including turning 90* to position to sit.  PT added leg press weight machine to program today for more strengthening. Patient and wife report they are pleased with progress thus far with goals for greater mobility.    Personal Factors and Comorbidities  Age;Comorbidity 3+;Fitness;Past/Current Experience;Time since onset of injury/illness/exacerbation    Comorbidities  Rt TTA, C2-5 Cervical Fusion 2014, Tachypnea, encephalopathy, RLE paralysis, TIA, A-Fib, CAD, CHF, CKD stage 3, Sz disorder, hx of vertigo, knee arthroplasty Rt X 2 & Lt X 1    Examination-Activity Limitations  Caring for Others;Locomotion Level;Reach Overhead;Squat;Stairs;Stand;Transfers    Examination-Participation Restrictions  Community Activity    Stability/Clinical Decision Making  Evolving/Moderate complexity    Rehab Potential  Good    PT Frequency  2x / week    PT Duration  Other (comment)   25 weeks (6 months)   PT Treatment/Interventions  ADLs/Self Care Home Management;DME Instruction;Gait training;Stair training;Functional mobility training;Therapeutic activities;Therapeutic exercise;Balance training;Neuromuscular  re-education;Patient/family education;Prosthetic Training    PT Next Visit Plan  Do recertification on 7/79/3903,  set new STGs, continue to encourage transfers with sliding board / prosthesis, NuStep & leg press, prosthetic gait with RW, standing balance exercises    Consulted and Agree with Plan of Care  Patient;Family member/caregiver    Family Member Consulted  wife, Eugene Isadore       Patient will benefit from skilled therapeutic intervention in order to improve the following deficits and impairments:  Abnormal gait, Cardiopulmonary status limiting activity, Decreased activity tolerance, Decreased balance, Decreased  endurance, Decreased knowledge of use of DME, Decreased mobility, Decreased range of motion, Decreased skin integrity, Decreased strength, Difficulty walking, Increased edema, Impaired flexibility, Postural dysfunction, Prosthetic Dependency, Pain  Visit Diagnosis: Other abnormalities of gait and mobility  Unsteadiness on feet  Abnormal posture  Weakness generalized  Stiffness of right knee, not elsewhere classified  Contracture of muscle, multiple sites  Cervicalgia     Problem List Patient Active Problem List   Diagnosis Date Noted  . Dehiscence of amputation stump ([place])   . Wound infection 05/09/2018  . AKI (acute kidney injury) ([place])   . S/P BKA (below knee amputation) unilateral, right ([place])   . Below-knee amputation with complication, initial encounter ([place])   . Tachypnea   . Post-operative pain   . Subacute osteomyelitis, right ankle and foot ([place])   . Paralysis of right lower extremity ([place])   . TIA (transient ischemic attack) 03/24/2018  . Encephalopathy 03/24/2018  . Cellulitis 03/15/2018  . Hypernatremia 03/15/2018  . Persistent atrial fibrillation ([place]) 11/10/2017  . Severe muscle deconditioning 11/10/2017  . Hemorrhage 10/22/2017  . CAD S/P percutaneous coronary angioplasty 10/22/2017  . Acute hypoxemic respiratory failure ([place])  07/17/2017  . Aspiration syndrome, subsequent encounter 07/11/2017  . Aspiration pneumonia ([place]) 07/01/2017  . Acute encephalopathy 07/01/2017  . Thrombocytopenia ([place]) 07/01/2017  . Chronic diastolic (congestive) heart failure ([place]) 07/01/2017  . Anemia of chronic disease 07/01/2017  . Troponin level elevated 07/01/2017  . Chronic atrial fibrillation 07/01/2017  . Goals of care, counseling/discussion   . Palliative care by specialist   . Acute metabolic encephalopathy 62/95/2841  . CKD (chronic kidney disease) stage 3, GFR 30-59 ml/min   . CAP (community acquired pneumonia) 04/17/2017  . Hallux rigidus, right foot 03/10/2016  . Lumbosacral spondylosis without myelopathy 10/29/2015  . Memory difficulty 09/22/2015  . Abnormality of gait 09/22/2015  . Hyperglycemia 07/06/2015  . Chronic pain 07/06/2015  . Chronic insomnia 03/30/2015  . Transient alteration of awareness 03/30/2015  . Abnormal liver function   . Altered mental status 01/31/2015  . Essential hypertension 01/31/2015  . Constipation 01/31/2015  . Hypothyroidism 01/31/2015  . Seizure disorder ([place]) 01/31/2015  . Bladder outlet obstruction 01/31/2015  . GERD (gastroesophageal reflux disease) 01/31/2015  . Chronic back pain 01/31/2015  . Acute kidney injury ([place]) 01/31/2015    Jamey Reas PT, DPT 07/30/2019, 1:50 PM  [facility] [address] [place], [place], [address] Phone: [phone]   Fax:  [phone]  Name: ROBERTS BON MRN: [id number] Date of Birth: [birth year]

## 2019-08-01 ENCOUNTER — Other Ambulatory Visit: Payer: Self-pay | Admitting: Cardiovascular Disease

## 2019-08-04 ENCOUNTER — Encounter: Payer: Self-pay | Admitting: Physical Therapy

## 2019-08-04 ENCOUNTER — Ambulatory Visit (INDEPENDENT_AMBULATORY_CARE_PROVIDER_SITE_OTHER): Payer: Medicare Other | Admitting: Physical Therapy

## 2019-08-04 DIAGNOSIS — R2681 Unsteadiness on feet: Secondary | ICD-10-CM | POA: Diagnosis not present

## 2019-08-04 DIAGNOSIS — R2689 Other abnormalities of gait and mobility: Secondary | ICD-10-CM | POA: Diagnosis not present

## 2019-08-04 DIAGNOSIS — M545 Low back pain, unspecified: Secondary | ICD-10-CM

## 2019-08-04 DIAGNOSIS — M25661 Stiffness of right knee, not elsewhere classified: Secondary | ICD-10-CM

## 2019-08-04 DIAGNOSIS — R531 Weakness: Secondary | ICD-10-CM | POA: Diagnosis not present

## 2019-08-04 DIAGNOSIS — M542 Cervicalgia: Secondary | ICD-10-CM

## 2019-08-04 DIAGNOSIS — R293 Abnormal posture: Secondary | ICD-10-CM | POA: Diagnosis not present

## 2019-08-04 DIAGNOSIS — M6249 Contracture of muscle, multiple sites: Secondary | ICD-10-CM

## 2019-08-04 DIAGNOSIS — G8929 Other chronic pain: Secondary | ICD-10-CM

## 2019-08-04 NOTE — Therapy (Signed)
[facility] [address] [place], [place], [address] Phone: [phone]   Fax:  [phone]  Physical Therapy Treatment  Patient Details  Name: [name] MRN: [id number] Date of Birth: 1933-06-24 Referring Provider (PT): Bevely Palmer Persons, [place]   Encounter Date: 08/04/2019  PT End of Session - 08/04/19 1153    Visit Number  21    Number of Visits  50    Date for PT Re-Evaluation  08/06/19    Authorization Type  BCBS & Medicare    PT Start Time  1150    PT Stop Time  1233    PT Time Calculation (min)  43 min    Equipment Utilized During Treatment  Gait belt    Activity Tolerance  Patient tolerated treatment well;Patient limited by fatigue    Behavior During Therapy  [facility] for tasks assessed/performed       Past Medical History:  Diagnosis Date  . Abnormality of gait 09/22/2015  . Arthritis   . Atrial fibrillation ([place])   . CAD (coronary artery disease)    Stent to RCA, Penta stent, 99% reduced to 0% 2002.  . Cancer ([place])    skin CA removed from back  . Chronic insomnia 03/30/2015  . Complication of anesthesia    trouble waking up  . GERD (gastroesophageal reflux disease)   . Hypercholesteremia   . Hypertension   . Hypothyroidism   . Memory difficulty 09/22/2015  . Osteoarthritis   . Pneumonia   . Seizures ([place])   . Sepsis ([place]) 05/2017  . Transient alteration of awareness 03/30/2015  . Vertigo    hx of    Past Surgical History:  Procedure Laterality Date  . AMPUTATION Right 03/28/2018   Procedure: AMPUTATION BELOW KNEE;  Surgeon: Newt Minion, MD;  Location: [place];  Service: Orthopedics;  Laterality: Right;  . APPLICATION OF WOUND VAC Right 01/23/2019   Procedure: Application Of  Prevena Wound Vac;  Surgeon: Newt Minion, MD;  Location: [place];  Service: Orthopedics;  Laterality: Right;  . BACK SURGERY    . EYE SURGERY     Bilateral Cataract surgery   . HERNIA REPAIR    . I & D EXTREMITY Right 05/10/2015   Procedure:  IRRIGATION AND DEBRIDEMENT EXTREMITY;  Surgeon: Leanora Cover, MD;  Location: [place];  Service: Orthopedics;  Laterality: Right;  . KNEE ARTHROPLASTY     right knee X 2; left knee once  . LAMINECTOMY     X 6  . LEG AMPUTATION BELOW KNEE Right 03/28/2018  . POSTERIOR CERVICAL FUSION/FORAMINOTOMY  01/28/2012   Procedure: POSTERIOR CERVICAL FUSION/FORAMINOTOMY LEVEL 3;  Surgeon: [name], MD;  Location: [place] [facility];  Service: Neurosurgery;  Laterality: Left;  Posterior Cervical Five-Thoracic One Fusion, Arthrodesis with LEFT Cervical Seven-thoracic One Laminectomy, Foraminotomy and Resection of Synovial Cyst  . POSTERIOR CERVICAL FUSION/FORAMINOTOMY N/A 01/29/2013   Procedure: POSTERIOR CERVICAL FUSION/FORAMINOTOMY LEVEL 1 and C2-5 Posteriolateral Arthrodesis;  Surgeon: [name], MD;  Location: [place] [facility];  Service: Neurosurgery;  Laterality: N/A;  C2-C3 Laminectomy C2-C3 posterior cervical arthrodesis  . STUMP REVISION Right 01/23/2019   Procedure: REVISION RIGHT BELOW KNEE AMPUTATION;  Surgeon: Newt Minion, MD;  Location: [place];  Service: Orthopedics;  Laterality: Right;  . TONSILLECTOMY      There were no vitals filed for this visit.  Subjective Assessment - 08/04/19 1154    Subjective  He feels he is getting stronger.  Patient is accompained by:  Family member   wife - [name]   Pertinent History  Rt TTA, C2-5 Cervical Fusion 2014, Tachypnea, encephalopathy, RLE paralysis, TIA, A-Fib, CAD, CHF, CKD stage 3, Sz disorder, hx of vertigo, knee arthroplasty Rt X 2 & Lt X 1    Limitations  Lifting;Standing;Walking;House hold activities    Patient Stated Goals  To get prosthesis, go to bathroom alone, get around house, get out in community.    Currently in Pain?  Yes    Pain Score  4     Pain Location  Other (Comment)   left shoulder & right hip mainly, but all over   Pain Orientation  Left;Right    Pain Descriptors / Indicators  Aching;Sore     Pain Type  Chronic pain;Other (Comment)   arthritis   Pain Onset  More than a month ago    Pain Frequency  Constant    Aggravating Factors   arthritis, stiffness & over doing it    Pain Relieving Factors  medications    Pain Onset  More than a month ago                        [facility] Adult PT Treatment/Exercise - 08/04/19 1150      Transfers   Transfers  Sit to Stand;Stand to Sit;Lateral/Scoot Transfers;Squat Pivot Transfers    Sit to Stand  3: Mod assist;With upper extremity assist;With armrests;From chair/3-in-1;Other (comment)   to RW   Sit to Stand Details  Tactile cues for weight shifting;Visual cues for safe use of DME/AE;Verbal cues for technique;Verbal cues for sequencing;Verbal cues for safe use of DME/AE    Stand to Sit  4: Min guard;4: Min assist;To chair/3-in-1;Other (comment);With armrests;With upper extremity assist   from RW   Stand to Sit Details (indicate cue type and reason)  Tactile cues for weight shifting;Visual cues for safe use of DME/AE;Verbal cues for technique;Verbal cues for sequencing;Verbal cues for safe use of DME/AE    Squat Pivot Transfers  4: Min assist;With upper extremity assistance   w/c <> Nustep     Ambulation/Gait   Ambulation/Gait  Yes    Ambulation/Gait Assistance  2: Max assist   1 person / PT assist   Ambulation/Gait Assistance Details  verbal, manual / tactile and demo cues on sequence, wt shift over prosthesis in stance and upright posture.     Ambulation Distance (Feet)  15 Feet   10' + 90* turn to position to sit, 15'    Assistive device  Prosthesis;Rolling walker    Gait Pattern  Step-to pattern;Decreased step length - left;Decreased stance time - right;Decreased stride length;Decreased hip/knee flexion - right;Decreased hip/knee flexion - left;Decreased weight shift to right;Right hip hike;Right foot flat;Right flexed knee in stance;Left flexed knee in stance;Antalgic;Lateral hip instability;Trunk flexed;Poor foot clearance  - right    Ambulation Surface  Level;Indoor      Neuro Re-ed    Neuro Re-ed Details   --      Knee/Hip Exercises: Aerobic   Nustep  Level 5 with BUEs & BLEs 5 minutes      Knee/Hip Exercises: Machines for Strengthening   Cybex Leg Press  shuttle leg press back at 45* - BLEs 62# 15 reps, RLE 12# 10 reps, LLE 31# 10 reps  Back flat BLEs 62# 10 reps  tactile & verbal cues for full range and controlled motion.       Prosthetics   Prosthetic Care Comments  Limb volume can change during day as weighting/unweighting prosthesis can push fluid out of limb.  Remove prosthesis after lunch to dry limb/ liner and check ply sock fit.      Current prosthetic wear tolerance (days/week)   daily     Current prosthetic wear tolerance (#hours/day)   most of awake hours.     Current prosthetic weight-bearing tolerance (hours/day)   no pain reported with today's attempted weight bearing activities     Residual limb condition   No redness noted. PT educated pt & wife probably from limb volume change during day with weighting/unweighting and no change in ply socks. Both verbalized understanding.   No open area noted on previously wound area, normal redness / pink in pressure tolerant areas of socket    Education Provided  Skin check;Residual limb care;Proper Donning;Proper wear schedule/adjustment;Other (comment);Correct ply sock adjustment   see prosthetic care comments   Person(s) Educated  Patient;Spouse    Education Method  Explanation;Demonstration;Tactile cues;Verbal cues    Education Method  Verbalized understanding;Verbal cues required;Needs further instruction    Donning Prosthesis  Supervision    Doffing Prosthesis  Supervision               PT Short Term Goals - 08/04/19 2219      PT SHORT TERM GOAL #1   Title  Patient & wife verbalize general understanding of adjusting ply socks with limb volume changes.    (All Target STGs: 09/04/2019)    Time  1    Period  Months    Status  New     Target Date  09/04/19      PT SHORT TERM GOAL #2   Title  Sit to / from stand w/c to RW with wife's assistance and maintains upright for 2 minutes with RW support with minA.    Time  1    Period  Months    Status  New    Target Date  09/04/19      PT SHORT TERM GOAL #3   Title  Stand pivot transfer with RW with modA    Time  1    Period  Months    Status  New    Target Date  09/04/19      PT SHORT TERM GOAL #4   Title  Patient tolerates prosthesis wear >12 hours total /day without wound issues.    Time  1    Period  Months    Status  Revised    Target Date  09/04/19      PT SHORT TERM GOAL #5   Title  Patient ambulates 13' with RW & prosthesis with modA.    Time  1    Period  Months    Status  Revised    Target Date  09/04/19        PT Long Term Goals - 05/18/19 1244      PT LONG TERM GOAL #1   Title  Patient and wife verbalize & demonstrate understanding of proper prosthetic care to enable safe utilization of prosthesis. (All LTGs Target Date: 11/03/2019)    Time  6    Period  Months    Status  On-going    Target Date  11/03/19      PT LONG TERM GOAL #2   Title  Patient tolerates wear of prosthesis >90% of awake hours without skin or residual limb pain issues to enable function during his day.    Time  6  Period  Months    Status  On-going    Target Date  11/03/19      PT LONG TERM GOAL #3   Title  Standing balance with RW support: maintains upright 2 minutes, reaches 5" anteriorly and scans environment with supervision.    Time  6    Period  Months    Status  On-going    Target Date  11/03/19      PT LONG TERM GOAL #4   Title  Patient ambulates 100' with RW & prosthesis with wife's assistance / supervision and turns 180* to position to sit safely.    Time  6    Period  Months    Status  On-going    Target Date  11/03/19      PT LONG TERM GOAL #5   Title  Sit to/from stand and stand-pivot transfers with RW & prosthesis with supervision.    Time  6     Period  Months    Status  On-going    Target Date  11/03/19            Plan - 08/04/19 1154    Clinical Impression Statement  PT session worked on exercises to increase strength & endurance. Patient is improving transfers & gait with prosthesis slowly due to age & multiple medical issues.    Personal Factors and Comorbidities  Age;Comorbidity 3+;Fitness;Past/Current Experience;Time since onset of injury/illness/exacerbation    Comorbidities  Rt TTA, C2-5 Cervical Fusion 2014, Tachypnea, encephalopathy, RLE paralysis, TIA, A-Fib, CAD, CHF, CKD stage 3, Sz disorder, hx of vertigo, knee arthroplasty Rt X 2 & Lt X 1    Examination-Activity Limitations  Caring for Others;Locomotion Level;Reach Overhead;Squat;Stairs;Stand;Transfers    Examination-Participation Restrictions  Community Activity    Stability/Clinical Decision Making  Evolving/Moderate complexity    Rehab Potential  Good    PT Frequency  2x / week    PT Duration  Other (comment)   25 weeks (6 months)   PT Treatment/Interventions  ADLs/Self Care Home Management;DME Instruction;Gait training;Stair training;Functional mobility training;Therapeutic activities;Therapeutic exercise;Balance training;Neuromuscular re-education;Patient/family education;Prosthetic Training    PT Next Visit Plan  Do recertification on [id number], continue to encourage transfers with sliding board / prosthesis, NuStep & leg press, prosthetic gait with RW, standing balance exercises    Consulted and Agree with Plan of Care  Patient;Family member/caregiver    Family Member Consulted  wife, Koner Frerking       Patient will benefit from skilled therapeutic intervention in order to improve the following deficits and impairments:  Abnormal gait, Cardiopulmonary status limiting activity, Decreased activity tolerance, Decreased balance, Decreased endurance, Decreased knowledge of use of DME, Decreased mobility, Decreased range of motion, Decreased skin integrity,  Decreased strength, Difficulty walking, Increased edema, Impaired flexibility, Postural dysfunction, Prosthetic Dependency, Pain  Visit Diagnosis: Other abnormalities of gait and mobility  Unsteadiness on feet  Abnormal posture  Weakness generalized  Stiffness of right knee, not elsewhere classified  Contracture of muscle, multiple sites  Cervicalgia  Chronic midline low back pain without sciatica     Problem List Patient Active Problem List   Diagnosis Date Noted  . Dehiscence of amputation stump ([place])   . Wound infection 05/09/2018  . AKI (acute kidney injury) ([place])   . S/P BKA (below knee amputation) unilateral, right ([place])   . Below-knee amputation with complication, initial encounter ([place])   . Tachypnea   . Post-operative pain   . Subacute osteomyelitis, right ankle and foot ([place])   .  Paralysis of right lower extremity ([place])   . TIA (transient ischemic attack) 03/24/2018  . Encephalopathy 03/24/2018  . Cellulitis 03/15/2018  . Hypernatremia 03/15/2018  . Persistent atrial fibrillation ([place]) 11/10/2017  . Severe muscle deconditioning 11/10/2017  . Hemorrhage 10/22/2017  . CAD S/P percutaneous coronary angioplasty 10/22/2017  . Acute hypoxemic respiratory failure ([place]) 07/17/2017  . Aspiration syndrome, subsequent encounter 07/11/2017  . Aspiration pneumonia ([place]) 07/01/2017  . Acute encephalopathy 07/01/2017  . Thrombocytopenia ([place]) 07/01/2017  . Chronic diastolic (congestive) heart failure ([place]) 07/01/2017  . Anemia of chronic disease 07/01/2017  . Troponin level elevated 07/01/2017  . Chronic atrial fibrillation 07/01/2017  . Goals of care, counseling/discussion   . Palliative care by specialist   . Acute metabolic encephalopathy [id number]  . CKD (chronic kidney disease) stage 3, GFR 30-59 ml/min   . CAP (community acquired pneumonia) 04/17/2017  . Hallux rigidus, right foot 03/10/2016  . Lumbosacral spondylosis without myelopathy 10/29/2015  . Memory  difficulty 09/22/2015  . Abnormality of gait 09/22/2015  . Hyperglycemia 07/06/2015  . Chronic pain 07/06/2015  . Chronic insomnia 03/30/2015  . Transient alteration of awareness 03/30/2015  . Abnormal liver function   . Altered mental status 01/31/2015  . Essential hypertension 01/31/2015  . Constipation 01/31/2015  . Hypothyroidism 01/31/2015  . Seizure disorder ([place]) 01/31/2015  . Bladder outlet obstruction 01/31/2015  . GERD (gastroesophageal reflux disease) 01/31/2015  . Chronic back pain 01/31/2015  . Acute kidney injury ([place]) 01/31/2015    Jamey Reas PT, DPT 08/04/2019, 10:28 PM  [facility] [address] [place], [place], [address] Phone: [phone]   Fax:  [phone]  Name: CASANOVA MONTFORD MRN: [id number] Date of Birth: July 07, 1933

## 2019-08-06 ENCOUNTER — Encounter: Payer: Self-pay | Admitting: Physical Therapy

## 2019-08-06 ENCOUNTER — Other Ambulatory Visit: Payer: Self-pay

## 2019-08-06 ENCOUNTER — Ambulatory Visit (INDEPENDENT_AMBULATORY_CARE_PROVIDER_SITE_OTHER): Payer: Medicare Other | Admitting: Physical Therapy

## 2019-08-06 DIAGNOSIS — R2681 Unsteadiness on feet: Secondary | ICD-10-CM

## 2019-08-06 DIAGNOSIS — G8929 Other chronic pain: Secondary | ICD-10-CM

## 2019-08-06 DIAGNOSIS — R2689 Other abnormalities of gait and mobility: Secondary | ICD-10-CM

## 2019-08-06 DIAGNOSIS — M6249 Contracture of muscle, multiple sites: Secondary | ICD-10-CM

## 2019-08-06 DIAGNOSIS — R531 Weakness: Secondary | ICD-10-CM | POA: Diagnosis not present

## 2019-08-06 DIAGNOSIS — Z9181 History of falling: Secondary | ICD-10-CM

## 2019-08-06 DIAGNOSIS — M25662 Stiffness of left knee, not elsewhere classified: Secondary | ICD-10-CM

## 2019-08-06 DIAGNOSIS — M545 Low back pain: Secondary | ICD-10-CM

## 2019-08-06 DIAGNOSIS — R293 Abnormal posture: Secondary | ICD-10-CM | POA: Diagnosis not present

## 2019-08-06 DIAGNOSIS — M25661 Stiffness of right knee, not elsewhere classified: Secondary | ICD-10-CM

## 2019-08-06 DIAGNOSIS — M542 Cervicalgia: Secondary | ICD-10-CM

## 2019-08-06 NOTE — Therapy (Signed)
[facility] Physical Therapy [address] [place], [place], [address] Phone: [phone]   Fax:  [phone]  Physical Therapy Treatment & Recertification  Patient Details  Name: [name] MRN: [id number] Date of Birth: [birth year] Referring Provider (PT): Bevely Palmer Persons, [place]   Encounter Date: 08/06/2019  PT End of Session - 08/06/19 1145    Visit Number  22    Number of Visits  48    Date for PT Re-Evaluation  11/04/19    Authorization Type  BCBS & Medicare    PT Start Time  6468    PT Stop Time  1231    PT Time Calculation (min)  46 min    Equipment Utilized During Treatment  Gait belt    Activity Tolerance  Patient tolerated treatment well;Patient limited by fatigue    Behavior During Therapy  [facility] for tasks assessed/performed       Past Medical History:  Diagnosis Date  . Abnormality of gait 09/22/2015  . Arthritis   . Atrial fibrillation ([place])   . CAD (coronary artery disease)    Stent to RCA, Penta stent, 99% reduced to 0% 2002.  . Cancer ([place])    skin CA removed from back  . Chronic insomnia 03/30/2015  . Complication of anesthesia    trouble waking up  . GERD (gastroesophageal reflux disease)   . Hypercholesteremia   . Hypertension   . Hypothyroidism   . Memory difficulty 09/22/2015  . Osteoarthritis   . Pneumonia   . Seizures ([place])   . Sepsis ([place]) 05/2017  . Transient alteration of awareness 03/30/2015  . Vertigo    hx of    Past Surgical History:  Procedure Laterality Date  . AMPUTATION Right 03/28/2018   Procedure: AMPUTATION BELOW KNEE;  Surgeon: Newt Minion, MD;  Location: [place];  Service: Orthopedics;  Laterality: Right;  . APPLICATION OF WOUND VAC Right 01/23/2019   Procedure: Application Of  Prevena Wound Vac;  Surgeon: Newt Minion, MD;  Location: [place];  Service: Orthopedics;  Laterality: Right;  . BACK SURGERY    . EYE SURGERY     Bilateral Cataract surgery   . HERNIA REPAIR    . I & D EXTREMITY Right  05/10/2015   Procedure: IRRIGATION AND DEBRIDEMENT EXTREMITY;  Surgeon: Leanora Cover, MD;  Location: [place];  Service: Orthopedics;  Laterality: Right;  . KNEE ARTHROPLASTY     right knee X 2; left knee once  . LAMINECTOMY     X 6  . LEG AMPUTATION BELOW KNEE Right 03/28/2018  . POSTERIOR CERVICAL FUSION/FORAMINOTOMY  01/28/2012   Procedure: POSTERIOR CERVICAL FUSION/FORAMINOTOMY LEVEL 3;  Surgeon: [name], MD;  Location: [place] [facility];  Service: Neurosurgery;  Laterality: Left;  Posterior Cervical Five-Thoracic One Fusion, Arthrodesis with LEFT Cervical Seven-thoracic One Laminectomy, Foraminotomy and Resection of Synovial Cyst  . POSTERIOR CERVICAL FUSION/FORAMINOTOMY N/A 01/29/2013   Procedure: POSTERIOR CERVICAL FUSION/FORAMINOTOMY LEVEL 1 and C2-5 Posteriolateral Arthrodesis;  Surgeon: [name], MD;  Location: [place] [facility];  Service: Neurosurgery;  Laterality: N/A;  C2-C3 Laminectomy C2-C3 posterior cervical arthrodesis  . STUMP REVISION Right 01/23/2019   Procedure: REVISION RIGHT BELOW KNEE AMPUTATION;  Surgeon: Newt Minion, MD;  Location: [place];  Service: Orthopedics;  Laterality: Right;  . TONSILLECTOMY      There were no vitals filed for this visit.  Subjective Assessment - 08/06/19 1145    Subjective  He was worn out yesterday most of  day. His scrotum is sore with sitting.    Patient is accompained by:  Family member   wife - [name]   Pertinent History  Rt TTA, C2-5 Cervical Fusion 2014, Tachypnea, encephalopathy, RLE paralysis, TIA, A-Fib, CAD, CHF, CKD stage 3, Sz disorder, hx of vertigo, knee arthroplasty Rt X 2 & Lt X 1    Limitations  Lifting;Standing;Walking;House hold activities    Patient Stated Goals  To get prosthesis, go to bathroom alone, get around house, get out in community.    Currently in Pain?  Yes    Pain Score  4     Pain Location  Other (Comment)   left shoulder, right hip / knee   Pain Orientation  Right;Left     Pain Descriptors / Indicators  Aching    Pain Type  Chronic pain    Pain Onset  More than a month ago    Pain Frequency  Constant    Aggravating Factors   athritis    Pain Relieving Factors  medications    Pain Onset  More than a month ago                        [facility] Adult PT Treatment/Exercise - 08/06/19 1145      Transfers   Transfers  Sit to Stand;Stand to Sit;Lateral/Scoot Transfers;Squat Pivot Transfers    Sit to Stand  3: Mod assist;With upper extremity assist;With armrests;From chair/3-in-1;Other (comment)   to RW   Sit to Stand Details  Tactile cues for weight shifting;Visual cues for safe use of DME/AE;Verbal cues for technique;Verbal cues for sequencing;Verbal cues for safe use of DME/AE    Stand to Sit  4: Min guard;4: Min assist;To chair/3-in-1;Other (comment);With armrests;With upper extremity assist   from RW   Stand to Sit Details (indicate cue type and reason)  Tactile cues for weight shifting;Visual cues for safe use of DME/AE;Verbal cues for technique;Verbal cues for sequencing;Verbal cues for safe use of DME/AE    Stand Pivot Transfers  2: Max assist   RW & TTA prosthesis   Stand Pivot Transfer Details (indicate cue type and reason)  manual / tactile & verbal cues on technique including sequence / movements & upright posture.     Squat Pivot Transfers  4: Min assist;With upper extremity assistance   w/c <> Nustep   Squat Pivot Transfer Details (indicate cue type and reason)  verbal cues to lift buttocks during transfer as sliding / scooting can be aggravating his scrotum.        Ambulation/Gait   Ambulation/Gait  Yes    Ambulation/Gait Assistance  2: Max assist   1 person / PT assist   Ambulation/Gait Assistance Details  verbal, manual / tactile and demo cues on sequence, wt shift over prosthesis in stance and upright posture.     Ambulation Distance (Feet)  15 Feet   10' + 90* turn to position to sit, 15'    Assistive device  Prosthesis;Rolling  walker    Gait Pattern  Step-to pattern;Decreased step length - left;Decreased stance time - right;Decreased stride length;Decreased hip/knee flexion - right;Decreased hip/knee flexion - left;Decreased weight shift to right;Right hip hike;Right foot flat;Right flexed knee in stance;Left flexed knee in stance;Antalgic;Lateral hip instability;Trunk flexed;Poor foot clearance - right    Ambulation Surface  Level;Indoor      Knee/Hip Exercises: Aerobic   Nustep  Level 5 with BUEs & BLEs 5 minutes      Knee/Hip Exercises: Machines  for Strengthening   Cybex Knee Extension  10# 10 reps 2 sets with verbal & tactile cues for quad contraction & control    Cybex Knee Flexion  seated hamstring curl 20# 10 reps 2 sets with tactile & verbal cues for hamstring contraction & controlled motion      Prosthetics   Prosthetic Care Comments   Limb volume can change during day as weighting/unweighting prosthesis can push fluid out of limb.  Remove prosthesis after lunch to dry limb/ liner and check ply sock fit.      Current prosthetic wear tolerance (days/week)   daily     Current prosthetic wear tolerance (#hours/day)   5hrs 2x/day    Current prosthetic weight-bearing tolerance (hours/day)   no pain reported with today's attempted weight bearing activities     Residual limb condition   No redness noted. PT educated pt & wife probably from limb volume change during day with weighting/unweighting and no change in ply socks. Both verbalized understanding.   No open area noted on previously wound area, normal redness / pink in pressure tolerant areas of socket    Education Provided  Skin check;Residual limb care;Proper Donning;Proper wear schedule/adjustment;Other (comment);Correct ply sock adjustment   see prosthetic care comments   Person(s) Educated  Patient;Spouse    Education Method  Explanation;Demonstration;Tactile cues;Verbal cues    Education Method  Verbalized understanding;Verbal cues required;Needs further  instruction    Donning Prosthesis  Minimal assist   MinA patient & supervision / cues wife   Doffing Prosthesis  Supervision               PT Short Term Goals - 08/04/19 2219      PT SHORT TERM GOAL #1   Title  Patient & wife verbalize general understanding of adjusting ply socks with limb volume changes.    (All Target STGs: 09/04/2019)    Time  1    Period  Months    Status  New    Target Date  09/04/19      PT SHORT TERM GOAL #2   Title  Sit to / from stand w/c to RW with wife's assistance and maintains upright for 2 minutes with RW support with minA.    Time  1    Period  Months    Status  New    Target Date  09/04/19      PT SHORT TERM GOAL #3   Title  Stand pivot transfer with RW with modA    Time  1    Period  Months    Status  New    Target Date  09/04/19      PT SHORT TERM GOAL #4   Title  Patient tolerates prosthesis wear >12 hours total /day without wound issues.    Time  1    Period  Months    Status  Revised    Target Date  09/04/19      PT SHORT TERM GOAL #5   Title  Patient ambulates 10' with RW & prosthesis with modA.    Time  1    Period  Months    Status  Revised    Target Date  09/04/19        PT Long Term Goals - 08/06/19 2236      PT LONG TERM GOAL #1   Title  Patient and wife verbalize & demonstrate understanding of proper prosthetic care & patient able to donne prosthesis modified independent to enable  safe utilization of prosthesis. (All LTGs Target Date: 11/04/2019)    Time  3    Period  Months    Status  On-going    Target Date  11/04/19      PT LONG TERM GOAL #2   Title  Patient tolerates wear of prosthesis >90% of awake hours without skin or residual limb pain issues to enable function during his day.    Time  3    Period  Months    Status  On-going    Target Date  11/04/19      PT LONG TERM GOAL #3   Title  Standing balance with RW support: maintains upright 2 minutes, reaches 5" anteriorly and scans environment with  supervision.    Time  3    Period  Months    Status  On-going    Target Date  11/04/19      PT LONG TERM GOAL #4   Title  Patient ambulates 100' with RW & prosthesis with wife's assistance / supervision and turns 180* to position to sit safely.    Time  3    Period  Months    Status  On-going    Target Date  11/04/19      PT LONG TERM GOAL #5   Title  Sit to/from stand and stand-pivot transfers with RW & prosthesis with supervision.    Time  3    Period  Months    Status  On-going    Target Date  11/04/19            Plan - 08/06/19 1152    Clinical Impression Statement  Patient has e progress with his mobility over the first 90 day certification period. PT anticipated plan of care for 6 months due to severe weakness, time since onset to initial PT due to delayed healing, age and chronic multiple medical issues. He has potential to improve moblity further with decreased burden of care on wife and lower fall risk.  Patient met short term goals indicating progress.    Personal Factors and Comorbidities  Age;Comorbidity 3+;Fitness;Past/Current Experience;Time since onset of injury/illness/exacerbation    Comorbidities  Rt TTA, C2-5 Cervical Fusion 2014, Tachypnea, encephalopathy, RLE paralysis, TIA, A-Fib, CAD, CHF, CKD stage 3, Sz disorder, hx of vertigo, knee arthroplasty Rt X 2 & Lt X 1    Examination-Activity Limitations  Caring for Others;Locomotion Level;Reach Overhead;Squat;Stairs;Stand;Transfers    Examination-Participation Restrictions  Community Activity    Stability/Clinical Decision Making  Evolving/Moderate complexity    Rehab Potential  Good    PT Frequency  2x / week    PT Duration  Other (comment)   13 weeks (90 days)   PT Treatment/Interventions  ADLs/Self Care Home Management;DME Instruction;Gait training;Stair training;Functional mobility training;Therapeutic activities;Therapeutic exercise;Balance training;Neuromuscular re-education;Patient/family  education;Prosthetic Training;Passive range of motion;Vestibular    PT Next Visit Plan  work towards updated STGs,  continue to encourage transfers with sliding board / prosthesis, NuStep, weight machines including leg press, prosthetic gait with RW, standing balance exercises    Consulted and Agree with Plan of Care  Patient;Family member/caregiver    Family Member Consulted  wife, Lucio Litsey       Patient will benefit from skilled therapeutic intervention in order to improve the following deficits and impairments:  Abnormal gait, Cardiopulmonary status limiting activity, Decreased activity tolerance, Decreased balance, Decreased endurance, Decreased knowledge of use of DME, Decreased mobility, Decreased range of motion, Decreased skin integrity, Decreased strength, Difficulty walking, Increased edema,  Impaired flexibility, Postural dysfunction, Prosthetic Dependency, Pain  Visit Diagnosis: Other abnormalities of gait and mobility  Unsteadiness on feet  Abnormal posture  Weakness generalized  Stiffness of right knee, not elsewhere classified  Contracture of muscle, multiple sites  Cervicalgia  Chronic midline low back pain without sciatica  History of fall  Stiffness of left knee, not elsewhere classified     Problem List Patient Active Problem List   Diagnosis Date Noted  . Dehiscence of amputation stump ([place])   . Wound infection 05/09/2018  . AKI (acute kidney injury) ([place])   . S/P BKA (below knee amputation) unilateral, right ([place])   . Below-knee amputation with complication, initial encounter ([place])   . Tachypnea   . Post-operative pain   . Subacute osteomyelitis, right ankle and foot ([place])   . Paralysis of right lower extremity ([place])   . TIA (transient ischemic attack) 03/24/2018  . Encephalopathy 03/24/2018  . Cellulitis 03/15/2018  . Hypernatremia 03/15/2018  . Persistent atrial fibrillation ([place]) 11/10/2017  . Severe muscle deconditioning 11/10/2017  .  Hemorrhage 10/22/2017  . CAD S/P percutaneous coronary angioplasty 10/22/2017  . Acute hypoxemic respiratory failure ([place]) 07/17/2017  . Aspiration syndrome, subsequent encounter 07/11/2017  . Aspiration pneumonia ([place]) 07/01/2017  . Acute encephalopathy 07/01/2017  . Thrombocytopenia ([place]) 07/01/2017  . Chronic diastolic (congestive) heart failure ([place]) 07/01/2017  . Anemia of chronic disease 07/01/2017  . Troponin level elevated 07/01/2017  . Chronic atrial fibrillation 07/01/2017  . Goals of care, counseling/discussion   . Palliative care by specialist   . Acute metabolic encephalopathy 05/04/3610  . CKD (chronic kidney disease) stage 3, GFR 30-59 ml/min   . CAP (community acquired pneumonia) 04/17/2017  . Hallux rigidus, right foot 03/10/2016  . Lumbosacral spondylosis without myelopathy 10/29/2015  . Memory difficulty 09/22/2015  . Abnormality of gait 09/22/2015  . Hyperglycemia 07/06/2015  . Chronic pain 07/06/2015  . Chronic insomnia 03/30/2015  . Transient alteration of awareness 03/30/2015  . Abnormal liver function   . Altered mental status 01/31/2015  . Essential hypertension 01/31/2015  . Constipation 01/31/2015  . Hypothyroidism 01/31/2015  . Seizure disorder ([place]) 01/31/2015  . Bladder outlet obstruction 01/31/2015  . GERD (gastroesophageal reflux disease) 01/31/2015  . Chronic back pain 01/31/2015  . Acute kidney injury ([place]) 01/31/2015    Jamey Reas PT, DPT 08/06/2019, 10:43 PM  [facility] Physical Therapy [address] [place], [place], [address] Phone: [phone]   Fax:  [phone]  Name: EDDY LISZEWSKI MRN: [id number] Date of Birth: [birth year]

## 2019-08-06 NOTE — Telephone Encounter (Signed)
Called pt to clarify how taking metoprolol after requesting advise from RN and following it and got no answer when called pt. So sent in medication as prescribed.

## 2019-08-11 ENCOUNTER — Encounter: Payer: Medicare Other | Admitting: Physical Therapy

## 2019-08-14 ENCOUNTER — Telehealth: Payer: Self-pay | Admitting: Orthopedic Surgery

## 2019-08-14 NOTE — Telephone Encounter (Signed)
Pts wife [name] called stating the pt is having a reaction to the silicone and would like to know what she can put on the skin to calm the irritation?  [phone]

## 2019-08-17 ENCOUNTER — Encounter: Payer: Medicare Other | Admitting: Physical Therapy

## 2019-08-17 NOTE — Telephone Encounter (Signed)
This is a BKA and talking about the liner of prosthetic . Any suggestions?

## 2019-08-17 NOTE — Telephone Encounter (Signed)
Have him wear the black Vive stump shrinker directly against the skin under the silicone liner.  Fold down the top of the stump shrinker so it rests over the kneecap so there is enough silicone proximally to provide good support.  Wear the stump shrinker around-the-clock and change daily.

## 2019-08-19 NOTE — Telephone Encounter (Signed)
I called and sw pt's wife to advise of message below. Will call with any other questions.

## 2019-08-20 ENCOUNTER — Encounter: Payer: Medicare Other | Admitting: Physical Therapy

## 2019-08-24 ENCOUNTER — Encounter: Payer: Medicare Other | Admitting: Physical Therapy

## 2019-08-27 ENCOUNTER — Encounter: Payer: Medicare Other | Admitting: Physical Therapy

## 2019-08-31 ENCOUNTER — Encounter: Payer: Medicare Other | Admitting: Physical Therapy

## 2019-09-01 ENCOUNTER — Encounter: Payer: Medicare Other | Admitting: Physical Therapy

## 2019-09-03 ENCOUNTER — Encounter: Payer: Self-pay | Admitting: Physical Therapy

## 2019-09-03 ENCOUNTER — Other Ambulatory Visit: Payer: Self-pay

## 2019-09-03 ENCOUNTER — Ambulatory Visit (INDEPENDENT_AMBULATORY_CARE_PROVIDER_SITE_OTHER): Payer: Medicare Other | Admitting: Physical Therapy

## 2019-09-03 DIAGNOSIS — M25661 Stiffness of right knee, not elsewhere classified: Secondary | ICD-10-CM

## 2019-09-03 DIAGNOSIS — R531 Weakness: Secondary | ICD-10-CM

## 2019-09-03 DIAGNOSIS — M6249 Contracture of muscle, multiple sites: Secondary | ICD-10-CM

## 2019-09-03 DIAGNOSIS — Z9181 History of falling: Secondary | ICD-10-CM

## 2019-09-03 DIAGNOSIS — R293 Abnormal posture: Secondary | ICD-10-CM

## 2019-09-03 DIAGNOSIS — G8929 Other chronic pain: Secondary | ICD-10-CM

## 2019-09-03 DIAGNOSIS — R2681 Unsteadiness on feet: Secondary | ICD-10-CM

## 2019-09-03 DIAGNOSIS — R2689 Other abnormalities of gait and mobility: Secondary | ICD-10-CM

## 2019-09-03 DIAGNOSIS — M545 Low back pain, unspecified: Secondary | ICD-10-CM

## 2019-09-03 DIAGNOSIS — M542 Cervicalgia: Secondary | ICD-10-CM

## 2019-09-03 NOTE — Therapy (Signed)
[name], [place], [address] Phone: [phone]   Fax:  [phone]  Physical Therapy Treatment  Patient Details  Name: [name] MRN: [id number] Date of Birth: 07-05-1933 Referring Provider (PT): [name], [place]   Encounter Date: 09/03/2019   PT End of Session - 09/03/19 1545    Visit Number 23    Number of Visits 48    Date for PT Re-Evaluation 11/04/19    Authorization Type BCBS & Medicare    PT Start Time 1350    PT Stop Time 1430    PT Time Calculation (min) 40 min    Equipment Utilized During Treatment Gait belt    Activity Tolerance Patient tolerated treatment well;Patient limited by fatigue    Behavior During Therapy [name] for tasks assessed/performed           Past Medical History:  Diagnosis Date  . Abnormality of gait 09/22/2015  . Arthritis   . Atrial fibrillation ([name])   . CAD (coronary artery disease)    Stent to RCA, Penta stent, 99% reduced to 0% 2002.  . Cancer ([name])    skin CA removed from back  . Chronic insomnia 03/30/2015  . Complication of anesthesia    trouble waking up  . GERD (gastroesophageal reflux disease)   . Hypercholesteremia   . Hypertension   . Hypothyroidism   . Memory difficulty 09/22/2015  . Osteoarthritis   . Pneumonia   . Seizures ([name])   . Sepsis ([name]) 05/2017  . Transient alteration of awareness 03/30/2015  . Vertigo    hx of    Past Surgical History:  Procedure Laterality Date  . AMPUTATION Right 03/28/2018   Procedure: AMPUTATION BELOW KNEE;  Surgeon: Robert Minion, MD;  Location: [facility];  Service: Orthopedics;  Laterality: Right;  . APPLICATION OF WOUND VAC Right 01/23/2019   Procedure: Application Of  Prevena Wound Vac;  Surgeon: Robert Minion, MD;  Location: [facility];  Service: Orthopedics;  Laterality: Right;  . BACK SURGERY    . EYE SURGERY     Bilateral Cataract surgery   . HERNIA REPAIR    . I & D EXTREMITY Right 05/10/2015   Procedure:  IRRIGATION AND DEBRIDEMENT EXTREMITY;  Surgeon: Robert Cover, MD;  Location: [facility];  Service: Orthopedics;  Laterality: Right;  . KNEE ARTHROPLASTY     right knee X 2; left knee once  . LAMINECTOMY     X 6  . LEG AMPUTATION BELOW KNEE Right 03/28/2018  . POSTERIOR CERVICAL FUSION/FORAMINOTOMY  01/28/2012   Procedure: POSTERIOR CERVICAL FUSION/FORAMINOTOMY LEVEL 3;  Surgeon: Robert Spangle, MD;  Location: [facility];  Service: Neurosurgery;  Laterality: Left;  Posterior Cervical Five-Thoracic One Fusion, Arthrodesis with LEFT Cervical Seven-thoracic One Laminectomy, Foraminotomy and Resection of Synovial Cyst  . POSTERIOR CERVICAL FUSION/FORAMINOTOMY N/A 01/29/2013   Procedure: POSTERIOR CERVICAL FUSION/FORAMINOTOMY LEVEL 1 and C2-5 Posteriolateral Arthrodesis;  Surgeon: Robert Spangle, MD;  Location: [facility];  Service: Neurosurgery;  Laterality: N/A;  C2-C3 Laminectomy C2-C3 posterior cervical arthrodesis  . STUMP REVISION Right 01/23/2019   Procedure: REVISION RIGHT BELOW KNEE AMPUTATION;  Surgeon: Robert Minion, MD;  Location: [facility];  Service: Orthopedics;  Laterality: Right;  . TONSILLECTOMY      There were no vitals filed for this visit.   Subjective Assessment - 09/03/19 1539    Subjective has not been in a month due to medical complications, now he is doing  better, scrotum wound has healed. He reports no pain on issues with prosthesis but is very tired and has not been out of bed in 2 weeks    Patient is accompained by: Family member   wife - [name]   Pertinent History Rt TTA, C2-5 Cervical Fusion 2014, Tachypnea, encephalopathy, RLE paralysis, TIA, A-Fib, CAD, CHF, CKD stage 3, Sz disorder, hx of vertigo, knee arthroplasty Rt X 2 & Lt X 1    Limitations Lifting;Standing;Walking;House hold activities    Patient Stated Goals To get prosthesis, go to bathroom alone, get around house, get out in community.    Pain Onset More than a month ago    Pain  Onset More than a month ago             [facility] Adult PT Treatment/Exercise - 09/03/19 0001      Transfers   Transfers Sit to Stand;Stand to Sit;Lateral/Scoot Transfers;Squat Pivot Transfers    Sit to Stand 3: Mod assist;With upper extremity assist;With armrests;From chair/3-in-1;Other (comment)    Sit to Stand Details Tactile cues for weight shifting;Visual cues for safe use of DME/AE;Verbal cues for technique;Verbal cues for sequencing;Verbal cues for safe use of DME/AE    Stand to Sit 3: Mod assist    Stand to Sit Details (indicate cue type and reason) Tactile cues for weight shifting;Visual cues for safe use of DME/AE;Verbal cues for technique;Verbal cues for sequencing;Verbal cues for safe use of DME/AE    Stand to Sit Details mod A from wheelchair, min A from barstool    Stand Pivot Transfers 2: Max assist    Stand Pivot Transfer Details (indicate cue type and reason) Manual /tactile & verbal cues on technique esp upright posture    Squat Pivot Transfers 3: Mod assist;With upper extremity assistance    Squat Pivot Transfer Details (indicate cue type and reason) sharp angle between w/c seat & mat table (2" lower) to enable more of scooting transfer as armrests on his transport w/c are not removable.       Ambulation/Gait   Ambulation/Gait Yes    Ambulation/Gait Assistance 2: Max assist    Ambulation/Gait Assistance Details PT manual, demo, & verbal cues on prosthetic foot placement & orientation, wt shift over prosthesis & sequence.     Ambulation Distance (Feet) 20 Feet    Assistive device Prosthesis;Rolling walker    Gait Pattern Step-to pattern;Decreased step length - left;Decreased stance time - right;Decreased stride length;Decreased hip/knee flexion - right;Decreased hip/knee flexion - left;Decreased weight shift to right;Right hip hike;Right foot flat;Right flexed knee in stance;Left flexed knee in stance;Antalgic;Lateral hip instability;Trunk flexed;Poor foot clearance - right      Ambulation Surface Level;Indoor      Neuro Re-ed    Neuro Re-ed Details  sitting balance and dynamic sitting balance on bar stool with cues for upright posture then performed rows 2X10 and chest press 2X10 with red band      Prosthetics   Prosthetic Care Comments  begin to again wear prosthesis 2 hours a day 1-2 times a day. Wear shrinker when prosthesis is not on to control shape and edema of residual limb    Donning Prosthesis Maximum assist    Doffing Prosthesis Moderate assist                    PT Short Term Goals - 08/04/19 2219      PT SHORT TERM GOAL #1   Title Patient & wife verbalize general understanding of adjusting ply  socks with limb volume changes.    (All Target STGs: 09/04/2019)    Time 1    Period Months    Status New    Target Date 09/04/19      PT SHORT TERM GOAL #2   Title Sit to / from stand w/c to RW with wife's assistance and maintains upright for 2 minutes with RW support with minA.    Time 1    Period Months    Status New    Target Date 09/04/19      PT SHORT TERM GOAL #3   Title Stand pivot transfer with RW with modA    Time 1    Period Months    Status New    Target Date 09/04/19      PT SHORT TERM GOAL #4   Title Patient tolerates prosthesis wear >12 hours total /day without wound issues.    Time 1    Period Months    Status Revised    Target Date 09/04/19      PT SHORT TERM GOAL #5   Title Patient ambulates 63' with RW & prosthesis with modA.    Time 1    Period Months    Status Revised    Target Date 09/04/19             PT Long Term Goals - 08/06/19 2236      PT LONG TERM GOAL #1   Title Patient and wife verbalize & demonstrate understanding of proper prosthetic care & patient able to donne prosthesis modified independent to enable safe utilization of prosthesis. (All LTGs Target Date: 11/04/2019)    Time 3    Period Months    Status On-going    Target Date 11/04/19      PT LONG TERM GOAL #2   Title Patient  tolerates wear of prosthesis >90% of awake hours without skin or residual limb pain issues to enable function during his day.    Time 3    Period Months    Status On-going    Target Date 11/04/19      PT LONG TERM GOAL #3   Title Standing balance with RW support: maintains upright 2 minutes, reaches 5" anteriorly and scans environment with supervision.    Time 3    Period Months    Status On-going    Target Date 11/04/19      PT LONG TERM GOAL #4   Title Patient ambulates 100' with RW & prosthesis with wife's assistance / supervision and turns 180* to position to sit safely.    Time 3    Period Months    Status On-going    Target Date 11/04/19      PT LONG TERM GOAL #5   Title Sit to/from stand and stand-pivot transfers with RW & prosthesis with supervision.    Time 3    Period Months    Status On-going    Target Date 11/04/19                 Plan - 09/03/19 1550    Clinical Impression Statement He returns after not being in PT for about a month. He has regressed some and session limited by fatigue but focused on activity tolerance, strength, endurance, and gait as tolerated while closly monitoring his vitals which were stable but HR increased from 77 to 92 after short ambulation of 20 ft. SPO2 levels would drop to 88 but would quickly rebound to 96%. PT  will continue to progress his as able.    Personal Factors and Comorbidities Age;Comorbidity 3+;Fitness;Past/Current Experience;Time since onset of injury/illness/exacerbation    Comorbidities Rt TTA, C2-5 Cervical Fusion 2014, Tachypnea, encephalopathy, RLE paralysis, TIA, A-Fib, CAD, CHF, CKD stage 3, Sz disorder, hx of vertigo, knee arthroplasty Rt X 2 & Lt X 1    Examination-Activity Limitations Caring for Others;Locomotion Level;Reach Overhead;Squat;Stairs;Stand;Transfers    Examination-Participation Restrictions Community Activity    Stability/Clinical Decision Making Evolving/Moderate complexity    Rehab Potential  Good    PT Frequency 2x / week    PT Duration Other (comment)   13 weeks (90 days)   PT Treatment/Interventions ADLs/Self Care Home Management;DME Instruction;Gait training;Stair training;Functional mobility training;Therapeutic activities;Therapeutic exercise;Balance training;Neuromuscular re-education;Patient/family education;Prosthetic Training;Passive range of motion;Vestibular    PT Next Visit Plan work towards updated STGs,  continue to encourage transfers with sliding board / prosthesis, NuStep, weight machines including leg press, prosthetic gait with RW, standing balance exercises    Consulted and Agree with Plan of Care Patient;Family member/caregiver    Family Member Consulted wife, Chael Urenda           Patient will benefit from skilled therapeutic intervention in order to improve the following deficits and impairments:  Abnormal gait, Cardiopulmonary status limiting activity, Decreased activity tolerance, Decreased balance, Decreased endurance, Decreased knowledge of use of DME, Decreased mobility, Decreased range of motion, Decreased skin integrity, Decreased strength, Difficulty walking, Increased edema, Impaired flexibility, Postural dysfunction, Prosthetic Dependency, Pain  Visit Diagnosis: Other abnormalities of gait and mobility  Unsteadiness on feet  Abnormal posture  Weakness generalized  Stiffness of right knee, not elsewhere classified  Contracture of muscle, multiple sites  Cervicalgia  Chronic midline low back pain without sciatica  History of fall     Problem List Patient Active Problem List   Diagnosis Date Noted  . Dehiscence of amputation stump ([place])   . Wound infection 05/09/2018  . AKI (acute kidney injury) ([place])   . S/P BKA (below knee amputation) unilateral, right ([place])   . Below-knee amputation with complication, initial encounter ([place])   . Tachypnea   . Post-operative pain   . Subacute osteomyelitis, right ankle and foot ([place])   .  Paralysis of right lower extremity ([place])   . TIA (transient ischemic attack) 03/24/2018  . Encephalopathy 03/24/2018  . Cellulitis 03/15/2018  . Hypernatremia 03/15/2018  . Persistent atrial fibrillation ([place]) 11/10/2017  . Severe muscle deconditioning 11/10/2017  . Hemorrhage 10/22/2017  . CAD S/P percutaneous coronary angioplasty 10/22/2017  . Acute hypoxemic respiratory failure ([place]) 07/17/2017  . Aspiration syndrome, subsequent encounter 07/11/2017  . Aspiration pneumonia ([place]) 07/01/2017  . Acute encephalopathy 07/01/2017  . Thrombocytopenia ([place]) 07/01/2017  . Chronic diastolic (congestive) heart failure ([place]) 07/01/2017  . Anemia of chronic disease 07/01/2017  . Troponin level elevated 07/01/2017  . Chronic atrial fibrillation 07/01/2017  . Goals of care, counseling/discussion   . Palliative care by specialist   . Acute metabolic encephalopathy 47/82/9562  . CKD (chronic kidney disease) stage 3, GFR 30-59 ml/min   . CAP (community acquired pneumonia) 04/17/2017  . Hallux rigidus, right foot 03/10/2016  . Lumbosacral spondylosis without myelopathy 10/29/2015  . Memory difficulty 09/22/2015  . Abnormality of gait 09/22/2015  . Hyperglycemia 07/06/2015  . Chronic pain 07/06/2015  . Chronic insomnia 03/30/2015  . Transient alteration of awareness 03/30/2015  . Abnormal liver function   . Altered mental status 01/31/2015  . Essential hypertension 01/31/2015  . Constipation 01/31/2015  . Hypothyroidism 01/31/2015  .  Seizure disorder ([place]) 01/31/2015  . Bladder outlet obstruction 01/31/2015  . GERD (gastroesophageal reflux disease) 01/31/2015  . Chronic back pain 01/31/2015  . Acute kidney injury ([place]) 01/31/2015    Debbe Odea, PT,DPT 09/03/2019, 3:55 PM  [name] [address] [place], [place], [address] Phone: [phone]   Fax:  [phone]  Name: [name] MRN: [id number] Date of Birth: Feb 28, 1934

## 2019-09-07 ENCOUNTER — Encounter: Payer: Medicare Other | Admitting: Physical Therapy

## 2019-09-08 ENCOUNTER — Ambulatory Visit (INDEPENDENT_AMBULATORY_CARE_PROVIDER_SITE_OTHER): Payer: Medicare Other | Admitting: Physical Therapy

## 2019-09-08 ENCOUNTER — Encounter: Payer: Self-pay | Admitting: Physical Therapy

## 2019-09-08 ENCOUNTER — Other Ambulatory Visit: Payer: Self-pay

## 2019-09-08 DIAGNOSIS — R2689 Other abnormalities of gait and mobility: Secondary | ICD-10-CM | POA: Diagnosis not present

## 2019-09-08 DIAGNOSIS — R293 Abnormal posture: Secondary | ICD-10-CM

## 2019-09-08 DIAGNOSIS — R531 Weakness: Secondary | ICD-10-CM

## 2019-09-08 DIAGNOSIS — R2681 Unsteadiness on feet: Secondary | ICD-10-CM

## 2019-09-08 DIAGNOSIS — M6249 Contracture of muscle, multiple sites: Secondary | ICD-10-CM

## 2019-09-08 DIAGNOSIS — M542 Cervicalgia: Secondary | ICD-10-CM

## 2019-09-08 DIAGNOSIS — M25661 Stiffness of right knee, not elsewhere classified: Secondary | ICD-10-CM

## 2019-09-08 NOTE — Therapy (Signed)
[facility] [address] [place], [place], [address] Phone: [phone]   Fax:  [phone]  Physical Therapy Treatment  Patient Details  Name: [name] MRN: [id number] Date of Birth: [birth year] Referring Provider (PT): Bevely Palmer Persons, [place]   Encounter Date: 09/08/2019   PT End of Session - 09/08/19 1311    Visit Number 24    Number of Visits 48    Date for PT Re-Evaluation 11/04/19    Authorization Type BCBS & Medicare    PT Start Time 1150    PT Stop Time 1228    PT Time Calculation (min) 38 min    Equipment Utilized During Treatment Gait belt    Activity Tolerance Patient tolerated treatment well;Patient limited by fatigue    Behavior During Therapy [facility] for tasks assessed/performed           Past Medical History:  Diagnosis Date  . Abnormality of gait 09/22/2015  . Arthritis   . Atrial fibrillation ([place])   . CAD (coronary artery disease)    Stent to RCA, Penta stent, 99% reduced to 0% 2002.  . Cancer ([place])    skin CA removed from back  . Chronic insomnia 03/30/2015  . Complication of anesthesia    trouble waking up  . GERD (gastroesophageal reflux disease)   . Hypercholesteremia   . Hypertension   . Hypothyroidism   . Memory difficulty 09/22/2015  . Osteoarthritis   . Pneumonia   . Seizures ([place])   . Sepsis ([place]) 05/2017  . Transient alteration of awareness 03/30/2015  . Vertigo    hx of    Past Surgical History:  Procedure Laterality Date  . AMPUTATION Right 03/28/2018   Procedure: AMPUTATION BELOW KNEE;  Surgeon: Newt Minion, MD;  Location: [place];  Service: Orthopedics;  Laterality: Right;  . APPLICATION OF WOUND VAC Right 01/23/2019   Procedure: Application Of  Prevena Wound Vac;  Surgeon: Newt Minion, MD;  Location: [place];  Service: Orthopedics;  Laterality: Right;  . BACK SURGERY    . EYE SURGERY     Bilateral Cataract surgery   . HERNIA REPAIR    . I & D EXTREMITY Right 05/10/2015   Procedure:  IRRIGATION AND DEBRIDEMENT EXTREMITY;  Surgeon: Leanora Cover, MD;  Location: [place];  Service: Orthopedics;  Laterality: Right;  . KNEE ARTHROPLASTY     right knee X 2; left knee once  . LAMINECTOMY     X 6  . LEG AMPUTATION BELOW KNEE Right 03/28/2018  . POSTERIOR CERVICAL FUSION/FORAMINOTOMY  01/28/2012   Procedure: POSTERIOR CERVICAL FUSION/FORAMINOTOMY LEVEL 3;  Surgeon: [name], MD;  Location: [place] [facility];  Service: Neurosurgery;  Laterality: Left;  Posterior Cervical Five-Thoracic One Fusion, Arthrodesis with LEFT Cervical Seven-thoracic One Laminectomy, Foraminotomy and Resection of Synovial Cyst  . POSTERIOR CERVICAL FUSION/FORAMINOTOMY N/A 01/29/2013   Procedure: POSTERIOR CERVICAL FUSION/FORAMINOTOMY LEVEL 1 and C2-5 Posteriolateral Arthrodesis;  Surgeon: [name], MD;  Location: [place] [facility];  Service: Neurosurgery;  Laterality: N/A;  C2-C3 Laminectomy C2-C3 posterior cervical arthrodesis  . STUMP REVISION Right 01/23/2019   Procedure: REVISION RIGHT BELOW KNEE AMPUTATION;  Surgeon: Newt Minion, MD;  Location: [place];  Service: Orthopedics;  Laterality: Right;  . TONSILLECTOMY      There were no vitals filed for this visit.   Subjective Assessment - 09/08/19 1151    Subjective He has worn prosthesis daily for couple hours since last PT appointment. He has  been having loose stools which limits wear also.    Patient is accompained by: Family member   wife - [name]   Pertinent History Rt TTA, C2-5 Cervical Fusion 2014, Tachypnea, encephalopathy, RLE paralysis, TIA, A-Fib, CAD, CHF, CKD stage 3, Sz disorder, hx of vertigo, knee arthroplasty Rt X 2 & Lt X 1    Limitations Lifting;Standing;Walking;House hold activities    Patient Stated Goals To get prosthesis, go to bathroom alone, get around house, get out in community.    Currently in Pain? Yes    Pain Score 5     Pain Location Other (Comment)   arthritic pain neck, shoulder & hip   Pain  Descriptors / Indicators Aching;Sore    Pain Type Chronic pain    Pain Onset More than a month ago    Pain Frequency Constant    Aggravating Factors  arthritis    Pain Relieving Factors medications    Pain Onset More than a month ago                             [facility] Adult PT Treatment/Exercise - 09/08/19 1155      Transfers   Transfers Sit to Stand;Stand to Sit;Lateral/Scoot Transfers;Squat Pivot Transfers    Sit to Stand 3: Mod assist;With upper extremity assist;With armrests;From chair/3-in-1;Other (comment)    Sit to Stand Details Tactile cues for weight shifting;Visual cues for safe use of DME/AE;Verbal cues for technique;Verbal cues for sequencing;Verbal cues for safe use of DME/AE    Stand to Sit 3: Mod assist    Stand to Sit Details (indicate cue type and reason) Tactile cues for weight shifting;Visual cues for safe use of DME/AE;Verbal cues for technique;Verbal cues for sequencing;Verbal cues for safe use of DME/AE    Stand Pivot Transfers --    Squat Pivot Transfers 3: Mod assist;4: Min assist;With upper extremity assistance   modA to 2" higher surface & minA to 2" lower surface   Squat Pivot Transfer Details (indicate cue type and reason) sharp angle between w/c seat & mat table (2" lower) to enable more of scooting transfer as armrests on his transport w/c are not removable.       Ambulation/Gait   Ambulation/Gait Yes    Ambulation/Gait Assistance 2: Max assist   2 people for safety   Ambulation/Gait Assistance Details PT manual, demo, & verbal cues on prosthetic foot placement & orientation, wt shift over prosthesis & sequence.     Ambulation Distance (Feet) 5 Feet   5' X 2    Assistive device Prosthesis;Rolling walker    Gait Pattern Step-to pattern;Decreased step length - left;Decreased stance time - right;Decreased stride length;Decreased hip/knee flexion - right;Decreased hip/knee flexion - left;Decreased weight shift to right;Right hip hike;Right foot  flat;Right flexed knee in stance;Left flexed knee in stance;Antalgic;Lateral hip instability;Trunk flexed;Poor foot clearance - right    Ambulation Surface Level;Indoor      Neuro Re-ed    Neuro Re-ed Details  sitting on 24" bar stool: forward lean to reach/recovery, backward lean 2" / recovery and trunk rotation in available range.  5 reps ea.    Sit to/from stand with modA from 24" stool to RW 5 reps.       Knee/Hip Exercises: Aerobic   Nustep Level 5 with BUEs & BLEs 5 minutes      Prosthetics   Prosthetic Care Comments  PT recommending wearing prosthesis at all times out of bed.  Current prosthetic wear tolerance (days/week)  daily     Current prosthetic wear tolerance (#hours/day)  2 hours    Current prosthetic weight-bearing tolerance (hours/day)  no pain reported with today's attempted weight bearing activities     Residual limb condition  No redness noted. PT educated pt & wife probably from limb volume change during day with weighting/unweighting and no change in ply socks. Both verbalized understanding.   No open area noted on previously wound area, normal redness / pink in pressure tolerant areas of socket    Education Provided Prosthetic cleaning;Correct ply sock adjustment;Proper wear schedule/adjustment    Person(s) Educated Patient;Spouse    Education Method Explanation;Demonstration;Tactile cues;Verbal cues    Education Method Verbalized understanding;Returned demonstration;Verbal cues required;Tactile cues required;Needs further instruction    Donning Prosthesis Minimal assist    Doffing Prosthesis Supervision                    PT Short Term Goals - 09/08/19 1311      PT SHORT TERM GOAL #1   Title Patient & wife verbalize general understanding of adjusting ply socks with limb volume changes.    (All Target STGs: 09/04/2019)    Baseline NOT MET 09/08/2019 due to patient unable to attend PT over last month    Time 1    Period Months    Status Not Met     Target Date 09/04/19      PT SHORT TERM GOAL #2   Title Sit to / from stand w/c to RW with wife's assistance and maintains upright for 2 minutes with RW support with minA.    Baseline NOT MET 09/08/2019 due to patient unable to attend PT over last month    Time 1    Period Months    Status Not Met    Target Date 09/04/19      PT SHORT TERM GOAL #3   Title Stand pivot transfer with RW with modA    Baseline NOT MET 09/08/2019 due to patient unable to attend PT over last month    Time 1    Period Months    Status Not Met    Target Date 09/04/19      PT SHORT TERM GOAL #4   Title Patient tolerates prosthesis wear >12 hours total /day without wound issues.    Baseline NOT MET 09/08/2019 due to patient unable to attend PT over last month    Time 1    Period Months    Status Not Met    Target Date 09/04/19      PT SHORT TERM GOAL #5   Title Patient ambulates 75' with RW & prosthesis with modA.    Baseline NOT MET 09/08/2019 due to patient unable to attend PT over last month    Time 1    Period Months    Status Not Met    Target Date 09/04/19             PT Short Term Goals - 09/08/19 1315      PT SHORT TERM GOAL #1   Title Patient & wife verbalize general understanding of adjusting ply socks with limb volume changes.    (All Target STGs: 10/08/2019)    Time 1    Period Months    Status On-going    Target Date 10/08/19      PT SHORT TERM GOAL #2   Title Sit to / from stand w/c to RW with wife's assistance and  maintains upright for 2 minutes with RW support with minA.    Time 1    Period Months    Status On-going    Target Date 10/08/19      PT SHORT TERM GOAL #3   Title Stand pivot transfer with RW with modA    Baseline NOT MET 09/08/2019 due to patient unable to attend PT over last month    Time 1    Period Months    Status On-going    Target Date 10/08/19      PT SHORT TERM GOAL #4   Title Patient tolerates prosthesis wear >12 hours total /day without wound issues.     Baseline NOT MET 09/08/2019 due to patient unable to attend PT over last month    Time 1    Period Months    Status On-going    Target Date 10/08/19      PT SHORT TERM GOAL #5   Title Patient ambulates 61' with RW & prosthesis with modA.    Baseline NOT MET 09/08/2019 due to patient unable to attend PT over last month    Time 1    Period Months    Status On-going    Target Date 10/08/19             PT Long Term Goals - 08/06/19 2236      PT LONG TERM GOAL #1   Title Patient and wife verbalize & demonstrate understanding of proper prosthetic care & patient able to donne prosthesis modified independent to enable safe utilization of prosthesis. (All LTGs Target Date: 11/04/2019)    Time 3    Period Months    Status On-going    Target Date 11/04/19      PT LONG TERM GOAL #2   Title Patient tolerates wear of prosthesis >90% of awake hours without skin or residual limb pain issues to enable function during his day.    Time 3    Period Months    Status On-going    Target Date 11/04/19      PT LONG TERM GOAL #3   Title Standing balance with RW support: maintains upright 2 minutes, reaches 5" anteriorly and scans environment with supervision.    Time 3    Period Months    Status On-going    Target Date 11/04/19      PT LONG TERM GOAL #4   Title Patient ambulates 100' with RW & prosthesis with wife's assistance / supervision and turns 180* to position to sit safely.    Time 3    Period Months    Status On-going    Target Date 11/04/19      PT LONG TERM GOAL #5   Title Sit to/from stand and stand-pivot transfers with RW & prosthesis with supervision.    Time 3    Period Months    Status On-going    Target Date 11/04/19                 Plan - 09/08/19 1313    Clinical Impression Statement Patient did not meet any of STGs set for this 30-day period due illness & unable to attend PT.  Patient did regress his mobility, strength & endurance with month of limited  activity but not to level at initial eval.    Personal Factors and Comorbidities Age;Comorbidity 3+;Fitness;Past/Current Experience;Time since onset of injury/illness/exacerbation    Comorbidities Rt TTA, C2-5 Cervical Fusion 2014, Tachypnea, encephalopathy, RLE paralysis, TIA, A-Fib, CAD,  CHF, CKD stage 3, Sz disorder, hx of vertigo, knee arthroplasty Rt X 2 & Lt X 1    Examination-Activity Limitations Caring for Others;Locomotion Level;Reach Overhead;Squat;Stairs;Stand;Transfers    Examination-Participation Restrictions Community Activity    Stability/Clinical Decision Making Evolving/Moderate complexity    Rehab Potential Good    PT Frequency 2x / week    PT Duration Other (comment)   13 weeks (90 days)   PT Treatment/Interventions ADLs/Self Care Home Management;DME Instruction;Gait training;Stair training;Functional mobility training;Therapeutic activities;Therapeutic exercise;Balance training;Neuromuscular re-education;Patient/family education;Prosthetic Training;Passive range of motion;Vestibular    PT Next Visit Plan work towards updated STGs,  continue to encourage transfers with sliding board / prosthesis, NuStep, weight machines including leg press, prosthetic gait with RW, standing balance exercises    Consulted and Agree with Plan of Care Patient;Family member/caregiver    Family Member Consulted wife, Curlie Macken           Patient will benefit from skilled therapeutic intervention in order to improve the following deficits and impairments:  Abnormal gait, Cardiopulmonary status limiting activity, Decreased activity tolerance, Decreased balance, Decreased endurance, Decreased knowledge of use of DME, Decreased mobility, Decreased range of motion, Decreased skin integrity, Decreased strength, Difficulty walking, Increased edema, Impaired flexibility, Postural dysfunction, Prosthetic Dependency, Pain  Visit Diagnosis: Other abnormalities of gait and mobility  Unsteadiness on feet   Abnormal posture  Weakness generalized  Stiffness of right knee, not elsewhere classified  Contracture of muscle, multiple sites  Cervicalgia     Problem List Patient Active Problem List   Diagnosis Date Noted  . Dehiscence of amputation stump ([place])   . Wound infection 05/09/2018  . AKI (acute kidney injury) ([place])   . S/P BKA (below knee amputation) unilateral, right ([place])   . Below-knee amputation with complication, initial encounter ([place])   . Tachypnea   . Post-operative pain   . Subacute osteomyelitis, right ankle and foot ([place])   . Paralysis of right lower extremity ([place])   . TIA (transient ischemic attack) 03/24/2018  . Encephalopathy 03/24/2018  . Cellulitis 03/15/2018  . Hypernatremia 03/15/2018  . Persistent atrial fibrillation ([place]) 11/10/2017  . Severe muscle deconditioning 11/10/2017  . Hemorrhage 10/22/2017  . CAD S/P percutaneous coronary angioplasty 10/22/2017  . Acute hypoxemic respiratory failure ([place]) 07/17/2017  . Aspiration syndrome, subsequent encounter 07/11/2017  . Aspiration pneumonia ([place]) 07/01/2017  . Acute encephalopathy 07/01/2017  . Thrombocytopenia ([place]) 07/01/2017  . Chronic diastolic (congestive) heart failure ([place]) 07/01/2017  . Anemia of chronic disease 07/01/2017  . Troponin level elevated 07/01/2017  . Chronic atrial fibrillation 07/01/2017  . Goals of care, counseling/discussion   . Palliative care by specialist   . Acute metabolic encephalopathy 08/67/6195  . CKD (chronic kidney disease) stage 3, GFR 30-59 ml/min   . CAP (community acquired pneumonia) 04/17/2017  . Hallux rigidus, right foot 03/10/2016  . Lumbosacral spondylosis without myelopathy 10/29/2015  . Memory difficulty 09/22/2015  . Abnormality of gait 09/22/2015  . Hyperglycemia 07/06/2015  . Chronic pain 07/06/2015  . Chronic insomnia 03/30/2015  . Transient alteration of awareness 03/30/2015  . Abnormal liver function   . Altered mental status 01/31/2015  .  Essential hypertension 01/31/2015  . Constipation 01/31/2015  . Hypothyroidism 01/31/2015  . Seizure disorder ([place]) 01/31/2015  . Bladder outlet obstruction 01/31/2015  . GERD (gastroesophageal reflux disease) 01/31/2015  . Chronic back pain 01/31/2015  . Acute kidney injury ([place]) 01/31/2015    Jamey Reas PT, DPT 09/08/2019, 1:15 PM  [facility] [phone]  [place], [place], [address] Phone: [phone]   Fax:  [phone]  Name: [name] MRN: [id number] Date of Birth: 1933-11-16

## 2019-09-10 ENCOUNTER — Ambulatory Visit (INDEPENDENT_AMBULATORY_CARE_PROVIDER_SITE_OTHER): Payer: Medicare Other | Admitting: Physical Therapy

## 2019-09-10 ENCOUNTER — Other Ambulatory Visit: Payer: Self-pay

## 2019-09-10 ENCOUNTER — Encounter: Payer: Self-pay | Admitting: Physical Therapy

## 2019-09-10 DIAGNOSIS — R293 Abnormal posture: Secondary | ICD-10-CM

## 2019-09-10 DIAGNOSIS — R2681 Unsteadiness on feet: Secondary | ICD-10-CM | POA: Diagnosis not present

## 2019-09-10 DIAGNOSIS — R531 Weakness: Secondary | ICD-10-CM

## 2019-09-10 DIAGNOSIS — M25661 Stiffness of right knee, not elsewhere classified: Secondary | ICD-10-CM

## 2019-09-10 DIAGNOSIS — M6249 Contracture of muscle, multiple sites: Secondary | ICD-10-CM

## 2019-09-10 DIAGNOSIS — R2689 Other abnormalities of gait and mobility: Secondary | ICD-10-CM | POA: Diagnosis not present

## 2019-09-10 DIAGNOSIS — M542 Cervicalgia: Secondary | ICD-10-CM

## 2019-09-10 NOTE — Therapy (Signed)
[facility] [address] [place], [place], [address] Phone: [phone]   Fax:  [phone]  Physical Therapy Treatment  Patient Details  Name: [name] MRN: [id number] Date of Birth: 06/25/1933 Referring Provider (PT): Mary Anne Persons, PA   Encounter Date: 09/10/2019   PT End of Session - 09/10/19 1501    Visit Number 25    Number of Visits 48    Date for PT Re-Evaluation 11/04/19    Authorization Type BCBS & Medicare    PT Start Time 0235    PT Stop Time 0300    PT Time Calculation (min) 25 min    Equipment Utilized During Treatment Gait belt    Activity Tolerance Patient tolerated treatment well;Patient limited by fatigue    Behavior During Therapy WFL for tasks assessed/performed           Past Medical History:  Diagnosis Date  . Abnormality of gait 09/22/2015  . Arthritis   . Atrial fibrillation (HCC)   . CAD (coronary artery disease)    Stent to RCA, Penta stent, 99% reduced to 0% 2002.  . Cancer (HCC)    skin CA removed from back  . Chronic insomnia 03/30/2015  . Complication of anesthesia    trouble waking up  . GERD (gastroesophageal reflux disease)   . Hypercholesteremia   . Hypertension   . Hypothyroidism   . Memory difficulty 09/22/2015  . Osteoarthritis   . Pneumonia   . Seizures (HCC)   . Sepsis (HCC) 05/2017  . Transient alteration of awareness 03/30/2015  . Vertigo    hx of    Past Surgical History:  Procedure Laterality Date  . AMPUTATION Right 03/28/2018   Procedure: AMPUTATION BELOW KNEE;  Surgeon: Duda, Marcus V, MD;  Location: [facility];  Service: Orthopedics;  Laterality: Right;  . APPLICATION OF WOUND VAC Right 01/23/2019   Procedure: Application Of  Prevena Wound Vac;  Surgeon: Duda, Marcus V, MD;  Location: [facility] OR;  Service: Orthopedics;  Laterality: Right;  . BACK SURGERY    . EYE SURGERY     Bilateral Cataract surgery   . HERNIA REPAIR    . I & D EXTREMITY Right 05/10/2015   Procedure:  IRRIGATION AND DEBRIDEMENT EXTREMITY;  Surgeon: Kevin Kuzma, MD;  Location: [facility];  Service: Orthopedics;  Laterality: Right;  . KNEE ARTHROPLASTY     right knee X 2; left knee once  . LAMINECTOMY     X 6  . LEG AMPUTATION BELOW KNEE Right 03/28/2018  . POSTERIOR CERVICAL FUSION/FORAMINOTOMY  01/28/2012   Procedure: POSTERIOR CERVICAL FUSION/FORAMINOTOMY LEVEL 3;  Surgeon: Robert W Nudelman, MD;  Location: [facility];  Service: Neurosurgery;  Laterality: Left;  Posterior Cervical Five-Thoracic One Fusion, Arthrodesis with LEFT Cervical Seven-thoracic One Laminectomy, Foraminotomy and Resection of Synovial Cyst  . POSTERIOR CERVICAL FUSION/FORAMINOTOMY N/A 01/29/2013   Procedure: POSTERIOR CERVICAL FUSION/FORAMINOTOMY LEVEL 1 and C2-5 Posteriolateral Arthrodesis;  Surgeon: Robert W Nudelman, MD;  Location: [facility];  Service: Neurosurgery;  Laterality: N/A;  C2-C3 Laminectomy C2-C3 posterior cervical arthrodesis  . STUMP REVISION Right 01/23/2019   Procedure: REVISION RIGHT BELOW KNEE AMPUTATION;  Surgeon: Duda, Marcus V, MD;  Location: [facility];  Service: Orthopedics;  Laterality: Right;  . TONSILLECTOMY      There were no vitals filed for this visit.   Subjective Assessment - 09/10/19 1459    Subjective relays he is very tired today and not sure how much he can do   in therapy. denies any residual limb pain, no skin breakdown, or any issues with prosthesis    Patient is accompained by: Family member   wife - [name]   Pertinent History Rt TTA, C2-5 Cervical Fusion 2014, Tachypnea, encephalopathy, RLE paralysis, TIA, A-Fib, CAD, CHF, CKD stage 3, Sz disorder, hx of vertigo, knee arthroplasty Rt X 2 & Lt X 1    Limitations Lifting;Standing;Walking;House hold activities    Patient Stated Goals To get prosthesis, go to bathroom alone, get around house, get out in community.    Pain Onset More than a month ago    Pain Onset More than a month ago             [facility]  Adult PT Treatment/Exercise - 09/10/19 0001      Transfers   Transfers Sit to Stand;Stand to Sit;Lateral/Scoot Transfers;Squat Pivot Transfers    Sit to Stand 3: Mod assist;With upper extremity assist;With armrests;From chair/3-in-1;Other (comment)    Sit to Stand Details Tactile cues for weight shifting;Visual cues for safe use of DME/AE;Verbal cues for technique;Verbal cues for sequencing;Verbal cues for safe use of DME/AE    Stand to Sit 3: Mod assist    Stand to Sit Details (indicate cue type and reason) Tactile cues for weight shifting;Visual cues for safe use of DME/AE;Verbal cues for technique;Verbal cues for sequencing;Verbal cues for safe use of DME/AE    Squat Pivot Transfers 3: Mod assist;4: Min assist;With upper extremity assistance      Ambulation/Gait   Gait Comments declined today      Knee/Hip Exercises: Aerobic   Nustep Level 5 with BUEs & BLEs 6.5 minutes                    PT Short Term Goals - 09/08/19 1315      PT SHORT TERM GOAL #1   Title Patient & wife verbalize general understanding of adjusting ply socks with limb volume changes.    (All Target STGs: 10/08/2019)    Time 1    Period Months    Status On-going    Target Date 10/08/19      PT SHORT TERM GOAL #2   Title Sit to / from stand w/c to RW with wife's assistance and maintains upright for 2 minutes with RW support with minA.    Time 1    Period Months    Status On-going    Target Date 10/08/19      PT SHORT TERM GOAL #3   Title Stand pivot transfer with RW with modA    Baseline NOT MET 09/08/2019 due to patient unable to attend PT over last month    Time 1    Period Months    Status On-going    Target Date 10/08/19      PT SHORT TERM GOAL #4   Title Patient tolerates prosthesis wear >12 hours total /day without wound issues.    Baseline NOT MET 09/08/2019 due to patient unable to attend PT over last month    Time 1    Period Months    Status On-going    Target Date 10/08/19       PT SHORT TERM GOAL #5   Title Patient ambulates 20' with RW & prosthesis with modA.    Baseline NOT MET 09/08/2019 due to patient unable to attend PT over last month    Time 1    Period Months    Status On-going    Target Date 10/08/19               PT Long Term Goals - 08/06/19 2236      PT LONG TERM GOAL #1   Title Patient and wife verbalize & demonstrate understanding of proper prosthetic care & patient able to donne prosthesis modified independent to enable safe utilization of prosthesis. (All LTGs Target Date: 11/04/2019)    Time 3    Period Months    Status On-going    Target Date 11/04/19      PT LONG TERM GOAL #2   Title Patient tolerates wear of prosthesis >90% of awake hours without skin or residual limb pain issues to enable function during his day.    Time 3    Period Months    Status On-going    Target Date 11/04/19      PT LONG TERM GOAL #3   Title Standing balance with RW support: maintains upright 2 minutes, reaches 5" anteriorly and scans environment with supervision.    Time 3    Period Months    Status On-going    Target Date 11/04/19      PT LONG TERM GOAL #4   Title Patient ambulates 100' with RW & prosthesis with wife's assistance / supervision and turns 180* to position to sit safely.    Time 3    Period Months    Status On-going    Target Date 11/04/19      PT LONG TERM GOAL #5   Title Sit to/from stand and stand-pivot transfers with RW & prosthesis with supervision.    Time 3    Period Months    Status On-going    Target Date 11/04/19                 Plan - 09/10/19 1502    Clinical Impression Statement Shorter session today at patient's request as he was not feeling well. Performed transfer training but then he declined any further activity for upper body or ambulation and states "I know my body and I can't do it today, I will make up for it next time". Allowed long rest break and checked vitals which were all stable, PT then  encouraged more activity but he again declined so ended session early.    Personal Factors and Comorbidities Age;Comorbidity 3+;Fitness;Past/Current Experience;Time since onset of injury/illness/exacerbation    Comorbidities Rt TTA, C2-5 Cervical Fusion 2014, Tachypnea, encephalopathy, RLE paralysis, TIA, A-Fib, CAD, CHF, CKD stage 3, Sz disorder, hx of vertigo, knee arthroplasty Rt X 2 & Lt X 1    Examination-Activity Limitations Caring for Others;Locomotion Level;Reach Overhead;Squat;Stairs;Stand;Transfers    Examination-Participation Restrictions Community Activity    Stability/Clinical Decision Making Evolving/Moderate complexity    Rehab Potential Good    PT Frequency 2x / week    PT Duration Other (comment)   13 weeks (90 days)   PT Treatment/Interventions ADLs/Self Care Home Management;DME Instruction;Gait training;Stair training;Functional mobility training;Therapeutic activities;Therapeutic exercise;Balance training;Neuromuscular re-education;Patient/family education;Prosthetic Training;Passive range of motion;Vestibular    PT Next Visit Plan work towards updated STGs,  continue to encourage transfers with sliding board / prosthesis, NuStep, weight machines including leg press, prosthetic gait with RW, standing balance exercises    Consulted and Agree with Plan of Care Patient;Family member/caregiver    Family Member Consulted wife, Evie Crumpler           Patient will benefit from skilled therapeutic intervention in order to improve the following deficits and impairments:  Abnormal gait, Cardiopulmonary status limiting activity, Decreased activity tolerance, Decreased balance, Decreased endurance, Decreased knowledge of use of DME,  Decreased mobility, Decreased range of motion, Decreased skin integrity, Decreased strength, Difficulty walking, Increased edema, Impaired flexibility, Postural dysfunction, Prosthetic Dependency, Pain  Visit Diagnosis: Other abnormalities of gait and  mobility  Unsteadiness on feet  Abnormal posture  Stiffness of right knee, not elsewhere classified  Weakness generalized  Contracture of muscle, multiple sites  Cervicalgia     Problem List Patient Active Problem List   Diagnosis Date Noted  . Dehiscence of amputation stump ([place])   . Wound infection 05/09/2018  . AKI (acute kidney injury) ([place])   . S/P BKA (below knee amputation) unilateral, right ([place])   . Below-knee amputation with complication, initial encounter ([place])   . Tachypnea   . Post-operative pain   . Subacute osteomyelitis, right ankle and foot ([place])   . Paralysis of right lower extremity ([place])   . TIA (transient ischemic attack) 03/24/2018  . Encephalopathy 03/24/2018  . Cellulitis 03/15/2018  . Hypernatremia 03/15/2018  . Persistent atrial fibrillation ([place]) 11/10/2017  . Severe muscle deconditioning 11/10/2017  . Hemorrhage 10/22/2017  . CAD S/P percutaneous coronary angioplasty 10/22/2017  . Acute hypoxemic respiratory failure ([place]) 07/17/2017  . Aspiration syndrome, subsequent encounter 07/11/2017  . Aspiration pneumonia ([place]) 07/01/2017  . Acute encephalopathy 07/01/2017  . Thrombocytopenia ([place]) 07/01/2017  . Chronic diastolic (congestive) heart failure ([place]) 07/01/2017  . Anemia of chronic disease 07/01/2017  . Troponin level elevated 07/01/2017  . Chronic atrial fibrillation 07/01/2017  . Goals of care, counseling/discussion   . Palliative care by specialist   . Acute metabolic encephalopathy 58/11/9831  . CKD (chronic kidney disease) stage 3, GFR 30-59 ml/min   . CAP (community acquired pneumonia) 04/17/2017  . Hallux rigidus, right foot 03/10/2016  . Lumbosacral spondylosis without myelopathy 10/29/2015  . Memory difficulty 09/22/2015  . Abnormality of gait 09/22/2015  . Hyperglycemia 07/06/2015  . Chronic pain 07/06/2015  . Chronic insomnia 03/30/2015  . Transient alteration of awareness 03/30/2015  . Abnormal liver function   .  Altered mental status 01/31/2015  . Essential hypertension 01/31/2015  . Constipation 01/31/2015  . Hypothyroidism 01/31/2015  . Seizure disorder ([place]) 01/31/2015  . Bladder outlet obstruction 01/31/2015  . GERD (gastroesophageal reflux disease) 01/31/2015  . Chronic back pain 01/31/2015  . Acute kidney injury ([place]) 01/31/2015    Debbe Odea, PT,DPT 09/10/2019, 3:05 PM  [facility] [address] [place], [place], [address] Phone: [phone]   Fax:  [phone]  Name: DINARI STGERMAINE MRN: [id number] Date of Birth: 1933/08/30

## 2019-09-15 ENCOUNTER — Encounter: Payer: Self-pay | Admitting: Physical Therapy

## 2019-09-15 ENCOUNTER — Ambulatory Visit (INDEPENDENT_AMBULATORY_CARE_PROVIDER_SITE_OTHER): Payer: Medicare Other | Admitting: Physical Therapy

## 2019-09-15 ENCOUNTER — Other Ambulatory Visit: Payer: Self-pay

## 2019-09-15 DIAGNOSIS — M6281 Muscle weakness (generalized): Secondary | ICD-10-CM

## 2019-09-15 DIAGNOSIS — R2681 Unsteadiness on feet: Secondary | ICD-10-CM | POA: Diagnosis not present

## 2019-09-15 DIAGNOSIS — M6249 Contracture of muscle, multiple sites: Secondary | ICD-10-CM

## 2019-09-15 DIAGNOSIS — M25661 Stiffness of right knee, not elsewhere classified: Secondary | ICD-10-CM | POA: Diagnosis not present

## 2019-09-15 DIAGNOSIS — R2689 Other abnormalities of gait and mobility: Secondary | ICD-10-CM

## 2019-09-15 DIAGNOSIS — Z9181 History of falling: Secondary | ICD-10-CM

## 2019-09-15 DIAGNOSIS — R293 Abnormal posture: Secondary | ICD-10-CM | POA: Diagnosis not present

## 2019-09-15 NOTE — Therapy (Signed)
[facility] [place], [place], [address] Phone: [phone]   Fax:  [phone]  Physical Therapy Treatment  Patient Details  Name: [name] MRN: [id number] Date of Birth: 11-15-1933 Referring Provider (PT): Bevely Palmer Persons, [place]   Encounter Date: 09/15/2019   PT End of Session - 09/15/19 1310    Visit Number 26    Number of Visits 48    Date for PT Re-Evaluation 11/04/19    Authorization Type BCBS & Medicare    PT Start Time 1448    PT Stop Time 1345    PT Time Calculation (min) 40 min    Equipment Utilized During Treatment Gait belt    Activity Tolerance Patient tolerated treatment well;Patient limited by fatigue    Behavior During Therapy [facility] for tasks assessed/performed           Past Medical History:  Diagnosis Date  . Abnormality of gait 09/22/2015  . Arthritis   . Atrial fibrillation ([place])   . CAD (coronary artery disease)    Stent to RCA, Penta stent, 99% reduced to 0% 2002.  . Cancer ([place])    skin CA removed from back  . Chronic insomnia 03/30/2015  . Complication of anesthesia    trouble waking up  . GERD (gastroesophageal reflux disease)   . Hypercholesteremia   . Hypertension   . Hypothyroidism   . Memory difficulty 09/22/2015  . Osteoarthritis   . Pneumonia   . Seizures ([place])   . Sepsis ([place]) 05/2017  . Transient alteration of awareness 03/30/2015  . Vertigo    hx of    Past Surgical History:  Procedure Laterality Date  . AMPUTATION Right 03/28/2018   Procedure: AMPUTATION BELOW KNEE;  Surgeon: Newt Minion, MD;  Location: [place];  Service: Orthopedics;  Laterality: Right;  . APPLICATION OF WOUND VAC Right 01/23/2019   Procedure: Application Of  Prevena Wound Vac;  Surgeon: Newt Minion, MD;  Location: [place];  Service: Orthopedics;  Laterality: Right;  . BACK SURGERY    . EYE SURGERY     Bilateral Cataract surgery   . HERNIA REPAIR    . I & D EXTREMITY Right 05/10/2015   Procedure:  IRRIGATION AND DEBRIDEMENT EXTREMITY;  Surgeon: Leanora Cover, MD;  Location: [place];  Service: Orthopedics;  Laterality: Right;  . KNEE ARTHROPLASTY     right knee X 2; left knee once  . LAMINECTOMY     X 6  . LEG AMPUTATION BELOW KNEE Right 03/28/2018  . POSTERIOR CERVICAL FUSION/FORAMINOTOMY  01/28/2012   Procedure: POSTERIOR CERVICAL FUSION/FORAMINOTOMY LEVEL 3;  Surgeon: [name], MD;  Location: [place] [facility];  Service: Neurosurgery;  Laterality: Left;  Posterior Cervical Five-Thoracic One Fusion, Arthrodesis with LEFT Cervical Seven-thoracic One Laminectomy, Foraminotomy and Resection of Synovial Cyst  . POSTERIOR CERVICAL FUSION/FORAMINOTOMY N/A 01/29/2013   Procedure: POSTERIOR CERVICAL FUSION/FORAMINOTOMY LEVEL 1 and C2-5 Posteriolateral Arthrodesis;  Surgeon: [name], MD;  Location: [place] [facility];  Service: Neurosurgery;  Laterality: N/A;  C2-C3 Laminectomy C2-C3 posterior cervical arthrodesis  . STUMP REVISION Right 01/23/2019   Procedure: REVISION RIGHT BELOW KNEE AMPUTATION;  Surgeon: Newt Minion, MD;  Location: [place];  Service: Orthopedics;  Laterality: Right;  . TONSILLECTOMY      There were no vitals filed for this visit.   Subjective Assessment - 09/15/19 1305    Subjective He wore prosthesis some every day over weekend. Yesterday 2x for 2-3 hours.  Wife asked pharmacist to see if meds are causing drowsiness    Patient is accompained by: Family member   wife - [name]   Pertinent History Rt TTA, C2-5 Cervical Fusion 2014, Tachypnea, encephalopathy, RLE paralysis, TIA, A-Fib, CAD, CHF, CKD stage 3, Sz disorder, hx of vertigo, knee arthroplasty Rt X 2 & Lt X 1    Limitations Lifting;Standing;Walking;House hold activities    Patient Stated Goals To get prosthesis, go to bathroom alone, get around house, get out in community.    Currently in Pain? Yes    Pain Score 4     Pain Location Other (Comment)   all over esp left shoulder & right hip    Pain Orientation Right;Left    Pain Descriptors / Indicators Aching;Sore    Pain Type Chronic pain    Pain Onset More than a month ago    Pain Frequency Constant    Aggravating Factors  activities    Pain Relieving Factors medications    Pain Onset More than a month ago                             [facility] Adult PT Treatment/Exercise - 09/15/19 1305      Transfers   Transfers Sit to Stand;Stand to Sit;Lateral/Scoot Transfers;Squat Pivot Transfers    Sit to Stand 3: Mod assist;With upper extremity assist;With armrests;From chair/3-in-1;Other (comment)   pulling on sink   Sit to Stand Details Tactile cues for weight shifting;Visual cues for safe use of DME/AE;Verbal cues for technique;Verbal cues for sequencing;Verbal cues for safe use of DME/AE    Stand to Sit 4: Min assist;With upper extremity assist;With armrests;To chair/3-in-1;Other (comment)   sink support   Stand to Sit Details (indicate cue type and reason) Tactile cues for weight shifting;Visual cues for safe use of DME/AE;Verbal cues for technique;Verbal cues for sequencing;Verbal cues for safe use of DME/AE    Squat Pivot Transfers 4: Min assist;With upper extremity assistance;3: Mod assist   modA to 2" higher surface & minA to 2" lower surface     Ambulation/Gait   Ambulation/Gait --    Ambulation/Gait Assistance --    Ambulation Distance (Feet) --    Assistive device --    Gait Pattern --      Neuro Re-ed    Neuro Re-ed Details  sitting on 24" bar stool: forward lean to reach/recovery, backward lean 2" / recovery and trunk rotation in available range.  5 reps ea.    Sit to/from stand with modA from 24" stool to RW 5 reps.       Knee/Hip Exercises: Aerobic   Nustep Level 5 with BUEs & BLEs 7 minutes      Knee/Hip Exercises: Seated   Other Seated Knee/Hip Exercises seated with BLEs on stool & feet against counter:  alternate LE leg presses 10 reps. 2 sets    Other Seated Knee/Hip Exercises w/c propulsion  with LEs with PT advancing & manual / verbal cues for motion for 10 reps / LE       Prosthetics   Prosthetic Care Comments  PT recommending wearing prosthesis at all times out of bed.      Current prosthetic wear tolerance (days/week)  daily     Current prosthetic wear tolerance (#hours/day)  2 hours    Current prosthetic weight-bearing tolerance (hours/day)  no pain reported with today's attempted weight bearing activities     Residual limb condition  No redness  noted. PT educated pt & wife probably from limb volume change during day with weighting/unweighting and no change in ply socks. Both verbalized understanding.   No open area noted on previously wound area, normal redness / pink in pressure tolerant areas of socket    Education Provided Prosthetic cleaning;Correct ply sock adjustment;Proper wear schedule/adjustment    Person(s) Educated Patient;Spouse    Education Method Explanation;Verbal cues    Education Method Verbalized understanding;Verbal cues required;Needs further instruction                    PT Short Term Goals - 09/08/19 1315      PT SHORT TERM GOAL #1   Title Patient & wife verbalize general understanding of adjusting ply socks with limb volume changes.    (All [facility] STGs: 10/08/2019)    Time 1    Period Months    Status On-going    Target Date 10/08/19      PT SHORT TERM GOAL #2   Title Sit to / from stand w/c to RW with wife's assistance and maintains upright for 2 minutes with RW support with minA.    Time 1    Period Months    Status On-going    Target Date 10/08/19      PT SHORT TERM GOAL #3   Title Stand pivot transfer with RW with modA    Baseline NOT MET 09/08/2019 due to patient unable to attend PT over last month    Time 1    Period Months    Status On-going    Target Date 10/08/19      PT SHORT TERM GOAL #4   Title Patient tolerates prosthesis wear >12 hours total /day without wound issues.    Baseline NOT MET 09/08/2019 due to patient  unable to attend PT over last month    Time 1    Period Months    Status On-going    Target Date 10/08/19      PT SHORT TERM GOAL #5   Title Patient ambulates 42' with RW & prosthesis with modA.    Baseline NOT MET 09/08/2019 due to patient unable to attend PT over last month    Time 1    Period Months    Status On-going    Target Date 10/08/19             PT Long Term Goals - 08/06/19 2236      PT LONG TERM GOAL #1   Title Patient and wife verbalize & demonstrate understanding of proper prosthetic care & patient able to donne prosthesis modified independent to enable safe utilization of prosthesis. (All LTGs Target Date: 11/04/2019)    Time 3    Period Months    Status On-going    Target Date 11/04/19      PT LONG TERM GOAL #2   Title Patient tolerates wear of prosthesis >90% of awake hours without skin or residual limb pain issues to enable function during his day.    Time 3    Period Months    Status On-going    Target Date 11/04/19      PT LONG TERM GOAL #3   Title Standing balance with RW support: maintains upright 2 minutes, reaches 5" anteriorly and scans environment with supervision.    Time 3    Period Months    Status On-going    Target Date 11/04/19      PT LONG TERM GOAL #4  Title Patient ambulates 100' with RW & prosthesis with wife's assistance / supervision and turns 180* to position to sit safely.    Time 3    Period Months    Status On-going    Target Date 11/04/19      PT LONG TERM GOAL #5   Title Sit to/from stand and stand-pivot transfers with RW & prosthesis with supervision.    Time 3    Period Months    Status On-going    Target Date 11/04/19                 Plan - 09/15/19 1311    Clinical Impression Statement PT worked on [employer], sit to/from stand at sink, modified standing seated on 24" bar stool and exercises in w/c to improve strength & activity tolerance.  Patient's limb has no skin issues with prosthesis wear.    Personal  Factors and Comorbidities Age;Comorbidity 3+;Fitness;Past/Current Experience;Time since onset of injury/illness/exacerbation    Comorbidities Rt TTA, C2-5 Cervical Fusion 2014, Tachypnea, encephalopathy, RLE paralysis, TIA, A-Fib, CAD, CHF, CKD stage 3, Sz disorder, hx of vertigo, knee arthroplasty Rt X 2 & Lt X 1    Examination-Activity Limitations Caring for Others;Locomotion Level;Reach Overhead;Squat;Stairs;Stand;Transfers    Examination-Participation Restrictions Community Activity    Stability/Clinical Decision Making Evolving/Moderate complexity    Rehab Potential Good    PT Frequency 2x / week    PT Duration Other (comment)   13 weeks (90 days)   PT Treatment/Interventions ADLs/Self Care Home Management;DME Instruction;Gait training;Stair training;Functional mobility training;Therapeutic activities;Therapeutic exercise;Balance training;Neuromuscular re-education;Patient/family education;Prosthetic Training;Passive range of motion;Vestibular    PT Next Visit Plan work towards updated STGs,  continue to encourage transfers with sliding board / prosthesis, NuStep, weight machines including leg press, prosthetic gait with RW, standing balance exercises    Consulted and Agree with Plan of Care Patient;Family member/caregiver    Family Member Consulted wife, Kanin Lia           Patient will benefit from skilled therapeutic intervention in order to improve the following deficits and impairments:  Abnormal gait, Cardiopulmonary status limiting activity, Decreased activity tolerance, Decreased balance, Decreased endurance, Decreased knowledge of use of DME, Decreased mobility, Decreased range of motion, Decreased skin integrity, Decreased strength, Difficulty walking, Increased edema, Impaired flexibility, Postural dysfunction, Prosthetic Dependency, Pain  Visit Diagnosis: Unsteadiness on feet  Other abnormalities of gait and mobility  Abnormal posture  Stiffness of right knee, not  elsewhere classified  Muscle weakness (generalized)  History of falling  Contracture of muscle, multiple sites     Problem List Patient Active Problem List   Diagnosis Date Noted  . Dehiscence of amputation stump ([place])   . Wound infection 05/09/2018  . AKI (acute kidney injury) ([place])   . S/P BKA (below knee amputation) unilateral, right ([place])   . Below-knee amputation with complication, initial encounter ([place])   . Tachypnea   . Post-operative pain   . Subacute osteomyelitis, right ankle and foot ([place])   . Paralysis of right lower extremity ([place])   . TIA (transient ischemic attack) 03/24/2018  . Encephalopathy 03/24/2018  . Cellulitis 03/15/2018  . Hypernatremia 03/15/2018  . Persistent atrial fibrillation ([place]) 11/10/2017  . Severe muscle deconditioning 11/10/2017  . Hemorrhage 10/22/2017  . CAD S/P percutaneous coronary angioplasty 10/22/2017  . Acute hypoxemic respiratory failure ([place]) 07/17/2017  . Aspiration syndrome, subsequent encounter 07/11/2017  . Aspiration pneumonia ([place]) 07/01/2017  . Acute encephalopathy 07/01/2017  . Thrombocytopenia ([place]) 07/01/2017  . Chronic diastolic (  congestive) heart failure ([place]) 07/01/2017  . Anemia of chronic disease 07/01/2017  . Troponin level elevated 07/01/2017  . Chronic atrial fibrillation 07/01/2017  . Goals of care, counseling/discussion   . Palliative care by specialist   . Acute metabolic encephalopathy 53/79/4327  . CKD (chronic kidney disease) stage 3, GFR 30-59 ml/min   . CAP (community acquired pneumonia) 04/17/2017  . Hallux rigidus, right foot 03/10/2016  . Lumbosacral spondylosis without myelopathy 10/29/2015  . Memory difficulty 09/22/2015  . Abnormality of gait 09/22/2015  . Hyperglycemia 07/06/2015  . Chronic pain 07/06/2015  . Chronic insomnia 03/30/2015  . Transient alteration of awareness 03/30/2015  . Abnormal liver function   . Altered mental status 01/31/2015  . Essential hypertension 01/31/2015    . Constipation 01/31/2015  . Hypothyroidism 01/31/2015  . Seizure disorder ([place]) 01/31/2015  . Bladder outlet obstruction 01/31/2015  . GERD (gastroesophageal reflux disease) 01/31/2015  . Chronic back pain 01/31/2015  . Acute kidney injury ([place]) 01/31/2015    Jamey Reas PT, DPT 09/15/2019, 4:08 PM  [facility] Physical [facility] [address] [place], [place], [address] Phone: [phone]   Fax:  [phone]  Name: [name] MRN: [id number] Date of Birth: [birth year]

## 2019-09-17 ENCOUNTER — Encounter: Payer: Medicare Other | Admitting: Physical Therapy

## 2019-09-18 ENCOUNTER — Ambulatory Visit (INDEPENDENT_AMBULATORY_CARE_PROVIDER_SITE_OTHER): Payer: Medicare Other | Admitting: Physical Therapy

## 2019-09-18 ENCOUNTER — Encounter: Payer: Self-pay | Admitting: Physical Therapy

## 2019-09-18 ENCOUNTER — Other Ambulatory Visit: Payer: Self-pay

## 2019-09-18 DIAGNOSIS — R2689 Other abnormalities of gait and mobility: Secondary | ICD-10-CM | POA: Diagnosis not present

## 2019-09-18 DIAGNOSIS — M25661 Stiffness of right knee, not elsewhere classified: Secondary | ICD-10-CM | POA: Diagnosis not present

## 2019-09-18 DIAGNOSIS — R2681 Unsteadiness on feet: Secondary | ICD-10-CM | POA: Diagnosis not present

## 2019-09-18 DIAGNOSIS — M6281 Muscle weakness (generalized): Secondary | ICD-10-CM

## 2019-09-18 DIAGNOSIS — R293 Abnormal posture: Secondary | ICD-10-CM

## 2019-09-18 DIAGNOSIS — Z9181 History of falling: Secondary | ICD-10-CM

## 2019-09-18 DIAGNOSIS — R531 Weakness: Secondary | ICD-10-CM

## 2019-09-18 DIAGNOSIS — M6249 Contracture of muscle, multiple sites: Secondary | ICD-10-CM

## 2019-09-18 NOTE — Therapy (Signed)
[name], [place], [address] Phone: [phone]   Fax:  [phone]  Physical Therapy Treatment  Patient Details  Name: [name] MRN: [id number] Date of Birth: 1933/04/25 Referring Provider (PT): Bevely Palmer Persons, [place]   Encounter Date: 09/18/2019   PT End of Session - 09/18/19 1617    Visit Number 27    Number of Visits 48    Date for PT Re-Evaluation 11/04/19    Authorization Type BCBS & Medicare    PT Start Time 1435    PT Stop Time 1515    PT Time Calculation (min) 40 min    Equipment Utilized During Treatment Gait belt    Activity Tolerance Patient tolerated treatment well;Patient limited by fatigue    Behavior During Therapy [facility] for tasks assessed/performed           Past Medical History:  Diagnosis Date  . Abnormality of gait 09/22/2015  . Arthritis   . Atrial fibrillation ([place])   . CAD (coronary artery disease)    Stent to RCA, Penta stent, 99% reduced to 0% 2002.  . Cancer ([place])    skin CA removed from back  . Chronic insomnia 03/30/2015  . Complication of anesthesia    trouble waking up  . GERD (gastroesophageal reflux disease)   . Hypercholesteremia   . Hypertension   . Hypothyroidism   . Memory difficulty 09/22/2015  . Osteoarthritis   . Pneumonia   . Seizures ([place])   . Sepsis ([place]) 05/2017  . Transient alteration of awareness 03/30/2015  . Vertigo    hx of    Past Surgical History:  Procedure Laterality Date  . AMPUTATION Right 03/28/2018   Procedure: AMPUTATION BELOW KNEE;  Surgeon: Newt Minion, MD;  Location: [place];  Service: Orthopedics;  Laterality: Right;  . APPLICATION OF WOUND VAC Right 01/23/2019   Procedure: Application Of  Prevena Wound Vac;  Surgeon: Newt Minion, MD;  Location: [place];  Service: Orthopedics;  Laterality: Right;  . BACK SURGERY    . EYE SURGERY     Bilateral Cataract surgery   . HERNIA REPAIR    . I & D EXTREMITY Right 05/10/2015   Procedure:  IRRIGATION AND DEBRIDEMENT EXTREMITY;  Surgeon: Leanora Cover, MD;  Location: [place];  Service: Orthopedics;  Laterality: Right;  . KNEE ARTHROPLASTY     right knee X 2; left knee once  . LAMINECTOMY     X 6  . LEG AMPUTATION BELOW KNEE Right 03/28/2018  . POSTERIOR CERVICAL FUSION/FORAMINOTOMY  01/28/2012   Procedure: POSTERIOR CERVICAL FUSION/FORAMINOTOMY LEVEL 3;  Surgeon: [name], MD;  Location: [place] [facility];  Service: Neurosurgery;  Laterality: Left;  Posterior Cervical Five-Thoracic One Fusion, Arthrodesis with LEFT Cervical Seven-thoracic One Laminectomy, Foraminotomy and Resection of Synovial Cyst  . POSTERIOR CERVICAL FUSION/FORAMINOTOMY N/A 01/29/2013   Procedure: POSTERIOR CERVICAL FUSION/FORAMINOTOMY LEVEL 1 and C2-5 Posteriolateral Arthrodesis;  Surgeon: [name], MD;  Location: [place] [facility];  Service: Neurosurgery;  Laterality: N/A;  C2-C3 Laminectomy C2-C3 posterior cervical arthrodesis  . STUMP REVISION Right 01/23/2019   Procedure: REVISION RIGHT BELOW KNEE AMPUTATION;  Surgeon: Newt Minion, MD;  Location: [place];  Service: Orthopedics;  Laterality: Right;  . TONSILLECTOMY      There were no vitals filed for this visit.   Subjective Assessment - 09/18/19 1606    Subjective relays he still feels tired but no issues with prosthesis or skin integrity which  was checked today before donning prosthesis.    Patient is accompained by: Family member   wife - [name]   Pertinent History Rt TTA, C2-5 Cervical Fusion 2014, Tachypnea, encephalopathy, RLE paralysis, TIA, A-Fib, CAD, CHF, CKD stage 3, Sz disorder, hx of vertigo, knee arthroplasty Rt X 2 & Lt X 1    Limitations Lifting;Standing;Walking;House hold activities    Patient Stated Goals To get prosthesis, go to bathroom alone, get around house, get out in community.    Currently in Pain? No/denies    Pain Onset More than a month ago    Pain Onset More than a month ago                [facility] Adult PT Treatment/Exercise - 09/18/19 0001      Transfers   Transfers Sit to Stand;Stand to Sit;Lateral/Scoot Transfers;Squat Pivot Transfers    Sit to Stand 3: Mod assist;With upper extremity assist;With armrests;From chair/3-in-1;Other (comment)    Sit to Stand Details Tactile cues for weight shifting;Visual cues for safe use of DME/AE;Verbal cues for technique;Verbal cues for sequencing;Verbal cues for safe use of DME/AE    Sit to Stand Details (indicate cue type and reason) mostly mod A until became fatigued then max A    Stand to Sit 4: Min assist;With upper extremity assist;With armrests;To chair/3-in-1;Other (comment)    Stand to Sit Details (indicate cue type and reason) Tactile cues for weight shifting;Visual cues for safe use of DME/AE;Verbal cues for technique;Verbal cues for sequencing;Verbal cues for safe use of DME/AE    Squat Pivot Transfers 4: Min assist;With upper extremity assistance;3: Mod assist    Comments performed sit to stands at end of session from wheelchair and airex pad in seat pulling up from sink with UE for 3 reps standing about 2 minutes each time to address standing tolerance      Ambulation/Gait   Ambulation/Gait Yes    Ambulation/Gait Assistance 2: Max assist    Ambulation/Gait Assistance Details manual facilitation for weight shift and knee guarding as had difficulty fully extending knee during stance phase    Ambulation Distance (Feet) 5 Feet   x2     Knee/Hip Exercises: Aerobic   Nustep Level 5 with BUEs & BLEs 7 minutes using theraband around knees to reduce excess hip ER and abduction                    PT Short Term Goals - 09/08/19 1315      PT SHORT TERM GOAL #1   Title Patient & wife verbalize general understanding of adjusting ply socks with limb volume changes.    (All Target STGs: 10/08/2019)    Time 1    Period Months    Status On-going    Target Date 10/08/19      PT SHORT TERM GOAL #2   Title Sit to / from stand w/c to  RW with wife's assistance and maintains upright for 2 minutes with RW support with minA.    Time 1    Period Months    Status On-going    Target Date 10/08/19      PT SHORT TERM GOAL #3   Title Stand pivot transfer with RW with modA    Baseline NOT MET 09/08/2019 due to patient unable to attend PT over last month    Time 1    Period Months    Status On-going    Target Date 10/08/19      PT SHORT  TERM GOAL #4   Title Patient tolerates prosthesis wear >12 hours total /day without wound issues.    Baseline NOT MET 09/08/2019 due to patient unable to attend PT over last month    Time 1    Period Months    Status On-going    Target Date 10/08/19      PT SHORT TERM GOAL #5   Title Patient ambulates 30' with RW & prosthesis with modA.    Baseline NOT MET 09/08/2019 due to patient unable to attend PT over last month    Time 1    Period Months    Status On-going    Target Date 10/08/19             PT Long Term Goals - 08/06/19 2236      PT LONG TERM GOAL #1   Title Patient and wife verbalize & demonstrate understanding of proper prosthetic care & patient able to donne prosthesis modified independent to enable safe utilization of prosthesis. (All LTGs Target Date: 11/04/2019)    Time 3    Period Months    Status On-going    Target Date 11/04/19      PT LONG TERM GOAL #2   Title Patient tolerates wear of prosthesis >90% of awake hours without skin or residual limb pain issues to enable function during his day.    Time 3    Period Months    Status On-going    Target Date 11/04/19      PT LONG TERM GOAL #3   Title Standing balance with RW support: maintains upright 2 minutes, reaches 5" anteriorly and scans environment with supervision.    Time 3    Period Months    Status On-going    Target Date 11/04/19      PT LONG TERM GOAL #4   Title Patient ambulates 100' with RW & prosthesis with wife's assistance / supervision and turns 180* to position to sit safely.    Time 3     Period Months    Status On-going    Target Date 11/04/19      PT LONG TERM GOAL #5   Title Sit to/from stand and stand-pivot transfers with RW & prosthesis with supervision.    Time 3    Period Months    Status On-going    Target Date 11/04/19                 Plan - 09/18/19 1619    Clinical Impression Statement He continues to be limited by fatigue and needs extended rest breaks. He arrived without any socks for prosthesis which appeard to be way to loose so added 5 ply in beginning of session then during ambulation his prosthetic was rotating too much so added 3 more ply socks which helped. He then was fatigued and unable to perform sit to stand from wheelchair so moved to sink and added airex pad in chair and then he was able to use UE pull from sink and mod to max A to stand as long as he could (about 2 minutes) then sat back down to rest and this was repeated 3 more times. PT will continue to progress his endurance, gait, strength, and balance as able.    Personal Factors and Comorbidities Age;Comorbidity 3+;Fitness;Past/Current Experience;Time since onset of injury/illness/exacerbation    Comorbidities Rt TTA, C2-5 Cervical Fusion 2014, Tachypnea, encephalopathy, RLE paralysis, TIA, A-Fib, CAD, CHF, CKD stage 3, Sz disorder, hx of vertigo, knee arthroplasty  Rt X 2 & Lt X 1    Examination-Activity Limitations Caring for Others;Locomotion Level;Reach Overhead;Squat;Stairs;Stand;Transfers    Examination-Participation Restrictions Community Activity    Stability/Clinical Decision Making Evolving/Moderate complexity    Rehab Potential Good    PT Frequency 2x / week    PT Duration Other (comment)   13 weeks (90 days)   PT Treatment/Interventions ADLs/Self Care Home Management;DME Instruction;Gait training;Stair training;Functional mobility training;Therapeutic activities;Therapeutic exercise;Balance training;Neuromuscular re-education;Patient/family education;Prosthetic Training;Passive  range of motion;Vestibular    PT Next Visit Plan work towards updated STGs,  continue to encourage transfers with sliding board / prosthesis, NuStep, weight machines including leg press, prosthetic gait with RW, standing balance exercises    Consulted and Agree with Plan of Care Patient;Family member/caregiver    Family Member Consulted wife, Chidi Shirer           Patient will benefit from skilled therapeutic intervention in order to improve the following deficits and impairments:  Abnormal gait, Cardiopulmonary status limiting activity, Decreased activity tolerance, Decreased balance, Decreased endurance, Decreased knowledge of use of DME, Decreased mobility, Decreased range of motion, Decreased skin integrity, Decreased strength, Difficulty walking, Increased edema, Impaired flexibility, Postural dysfunction, Prosthetic Dependency, Pain  Visit Diagnosis: Unsteadiness on feet  Other abnormalities of gait and mobility  Abnormal posture  Stiffness of right knee, not elsewhere classified  Muscle weakness (generalized)  History of falling  Contracture of muscle, multiple sites  Weakness generalized     Problem List Patient Active Problem List   Diagnosis Date Noted  . Dehiscence of amputation stump ([place])   . Wound infection 05/09/2018  . AKI (acute kidney injury) ([place])   . S/P BKA (below knee amputation) unilateral, right ([place])   . Below-knee amputation with complication, initial encounter ([place])   . Tachypnea   . Post-operative pain   . Subacute osteomyelitis, right ankle and foot ([place])   . Paralysis of right lower extremity ([place])   . TIA (transient ischemic attack) 03/24/2018  . Encephalopathy 03/24/2018  . Cellulitis 03/15/2018  . Hypernatremia 03/15/2018  . Persistent atrial fibrillation ([place]) 11/10/2017  . Severe muscle deconditioning 11/10/2017  . Hemorrhage 10/22/2017  . CAD S/P percutaneous coronary angioplasty 10/22/2017  . Acute hypoxemic respiratory  failure ([place]) 07/17/2017  . Aspiration syndrome, subsequent encounter 07/11/2017  . Aspiration pneumonia ([place]) 07/01/2017  . Acute encephalopathy 07/01/2017  . Thrombocytopenia ([place]) 07/01/2017  . Chronic diastolic (congestive) heart failure ([place]) 07/01/2017  . Anemia of chronic disease 07/01/2017  . Troponin level elevated 07/01/2017  . Chronic atrial fibrillation 07/01/2017  . Goals of care, counseling/discussion   . Palliative care by specialist   . Acute metabolic encephalopathy 00/86/7619  . CKD (chronic kidney disease) stage 3, GFR 30-59 ml/min   . CAP (community acquired pneumonia) 04/17/2017  . Hallux rigidus, right foot 03/10/2016  . Lumbosacral spondylosis without myelopathy 10/29/2015  . Memory difficulty 09/22/2015  . Abnormality of gait 09/22/2015  . Hyperglycemia 07/06/2015  . Chronic pain 07/06/2015  . Chronic insomnia 03/30/2015  . Transient alteration of awareness 03/30/2015  . Abnormal liver function   . Altered mental status 01/31/2015  . Essential hypertension 01/31/2015  . Constipation 01/31/2015  . Hypothyroidism 01/31/2015  . Seizure disorder ([place]) 01/31/2015  . Bladder outlet obstruction 01/31/2015  . GERD (gastroesophageal reflux disease) 01/31/2015  . Chronic back pain 01/31/2015  . Acute kidney injury ([place]) 01/31/2015    Debbe Odea, PT,DPT 09/18/2019, 4:22 PM  [facility] Physical Therapy [address] [place], [place], [address] Phone:  [phone]   Fax:  [phone]  Name: CORTAVIOUS NIX MRN: [id number] Date of Birth: [birth year]

## 2019-09-21 ENCOUNTER — Other Ambulatory Visit: Payer: Self-pay

## 2019-09-21 ENCOUNTER — Encounter: Payer: Self-pay | Admitting: Physical Therapy

## 2019-09-21 ENCOUNTER — Ambulatory Visit (INDEPENDENT_AMBULATORY_CARE_PROVIDER_SITE_OTHER): Payer: Medicare Other | Admitting: Physical Therapy

## 2019-09-21 DIAGNOSIS — M6281 Muscle weakness (generalized): Secondary | ICD-10-CM

## 2019-09-21 DIAGNOSIS — R293 Abnormal posture: Secondary | ICD-10-CM

## 2019-09-21 DIAGNOSIS — Z9181 History of falling: Secondary | ICD-10-CM

## 2019-09-21 DIAGNOSIS — M25661 Stiffness of right knee, not elsewhere classified: Secondary | ICD-10-CM

## 2019-09-21 DIAGNOSIS — M6249 Contracture of muscle, multiple sites: Secondary | ICD-10-CM

## 2019-09-21 DIAGNOSIS — R2689 Other abnormalities of gait and mobility: Secondary | ICD-10-CM | POA: Diagnosis not present

## 2019-09-21 DIAGNOSIS — R2681 Unsteadiness on feet: Secondary | ICD-10-CM

## 2019-09-21 NOTE — Therapy (Signed)
[place], [place], [address] Phone: [phone]   Fax:  [phone]  Physical Therapy Treatment  Patient Details  Name: [name] MRN: [id number] Date of Birth: 1933-05-29 Referring Provider (PT): [name], [place]   Encounter Date: 09/21/2019   PT End of Session - 09/21/19 1158    Visit Number 28    Number of Visits 48    Date for PT Re-Evaluation 11/04/19    Authorization Type BCBS & Medicare    PT Start Time 1146    PT Stop Time 1232    PT Time Calculation (min) 46 min    Equipment Utilized During Treatment Gait belt    Activity Tolerance Patient tolerated treatment well;Patient limited by fatigue    Behavior During Therapy [facility] for tasks assessed/performed           Past Medical History:  Diagnosis Date  . Abnormality of gait 09/22/2015  . Arthritis   . Atrial fibrillation ([place])   . CAD (coronary artery disease)    Stent to RCA, Penta stent, 99% reduced to 0% 2002.  . Cancer ([place])    skin CA removed from back  . Chronic insomnia 03/30/2015  . Complication of anesthesia    trouble waking up  . GERD (gastroesophageal reflux disease)   . Hypercholesteremia   . Hypertension   . Hypothyroidism   . Memory difficulty 09/22/2015  . Osteoarthritis   . Pneumonia   . Seizures ([place])   . Sepsis ([place]) 05/2017  . Transient alteration of awareness 03/30/2015  . Vertigo    hx of    Past Surgical History:  Procedure Laterality Date  . AMPUTATION Right 03/28/2018   Procedure: AMPUTATION BELOW KNEE;  Surgeon: Newt Minion, MD;  Location: [place];  Service: Orthopedics;  Laterality: Right;  . APPLICATION OF WOUND VAC Right 01/23/2019   Procedure: Application Of  Prevena Wound Vac;  Surgeon: Newt Minion, MD;  Location: [place];  Service: Orthopedics;  Laterality: Right;  . BACK SURGERY    . EYE SURGERY     Bilateral Cataract surgery   . HERNIA REPAIR    . I & D EXTREMITY Right 05/10/2015   Procedure:  IRRIGATION AND DEBRIDEMENT EXTREMITY;  Surgeon: Leanora Cover, MD;  Location: [place];  Service: Orthopedics;  Laterality: Right;  . KNEE ARTHROPLASTY     right knee X 2; left knee once  . LAMINECTOMY     X 6  . LEG AMPUTATION BELOW KNEE Right 03/28/2018  . POSTERIOR CERVICAL FUSION/FORAMINOTOMY  01/28/2012   Procedure: POSTERIOR CERVICAL FUSION/FORAMINOTOMY LEVEL 3;  Surgeon: [name], MD;  Location: [place] [facility];  Service: Neurosurgery;  Laterality: Left;  Posterior Cervical Five-Thoracic One Fusion, Arthrodesis with LEFT Cervical Seven-thoracic One Laminectomy, Foraminotomy and Resection of Synovial Cyst  . POSTERIOR CERVICAL FUSION/FORAMINOTOMY N/A 01/29/2013   Procedure: POSTERIOR CERVICAL FUSION/FORAMINOTOMY LEVEL 1 and C2-5 Posteriolateral Arthrodesis;  Surgeon: [name], MD;  Location: [place] [facility];  Service: Neurosurgery;  Laterality: N/A;  C2-C3 Laminectomy C2-C3 posterior cervical arthrodesis  . STUMP REVISION Right 01/23/2019   Procedure: REVISION RIGHT BELOW KNEE AMPUTATION;  Surgeon: Newt Minion, MD;  Location: [place];  Service: Orthopedics;  Laterality: Right;  . TONSILLECTOMY      There were no vitals filed for this visit.   Subjective Assessment - 09/21/19 1146    Subjective He almost fell leaving [facility] building transfering to car with sliding board without prosthesis.  A person in parking lot was able to assist to prevent fall.  He has been out of bed more this weekend but still napping in bed twice each day.  He has been wearing prosthesis when out of bed & liner in bed.    Patient is accompained by: Family member   wife - [name]   Pertinent History Rt TTA, C2-5 Cervical Fusion 2014, Tachypnea, encephalopathy, RLE paralysis, TIA, A-Fib, CAD, CHF, CKD stage 3, Sz disorder, hx of vertigo, knee arthroplasty Rt X 2 & Lt X 1    Limitations Lifting;Standing;Walking;House hold activities    Patient Stated Goals To get prosthesis, go to  bathroom alone, get around house, get out in community.    Currently in Pain? Yes    Pain Score 4     Pain Location Other (Comment)   arthritic whole body, left shoulder & right hip worst   Pain Orientation Right;Left    Pain Descriptors / Indicators Aching;Sore    Pain Type Chronic pain    Pain Onset More than a month ago    Pain Frequency Constant    Aggravating Factors  arthritis    Pain Relieving Factors medication    Pain Onset More than a month ago                             [facility] Adult PT Treatment/Exercise - 09/21/19 1146      Transfers   Transfers Sit to Stand;Stand to Sit;Lateral/Scoot Transfers;Squat Pivot Transfers    Sit to Stand 3: Mod assist;With upper extremity assist;With armrests;From chair/3-in-1;Other (comment)   to RW   Sit to Stand Details Tactile cues for weight shifting;Visual cues for safe use of DME/AE;Verbal cues for technique;Verbal cues for sequencing;Verbal cues for safe use of DME/AE    Stand to Sit 4: Min assist;With upper extremity assist;With armrests;To chair/3-in-1;Other (comment)   from RW    Stand to Sit Details (indicate cue type and reason) Tactile cues for weight shifting;Visual cues for safe use of DME/AE;Verbal cues for technique;Verbal cues for sequencing;Verbal cues for safe use of DME/AE    Stand Pivot Transfers 2: Max assist   Nustep to w/c with RW & prosthesis   Stand Pivot Transfer Details (indicate cue type and reason) Manual cues for prosthesis & RW movement, verbal, demo & manual /tactile cues on technique.     Squat Pivot Transfers 4: Min assist;With upper extremity assistance   w/c to [employer]    Lateral/Scoot Transfers 4: Min assist;With slide board   w/c to car using prosthesis with sliding board   Lateral/Scoot Transfer Details (indicate cue type and reason) verbal & tactile cues on technique    Comments --      Ambulation/Gait   Ambulation/Gait --    Ambulation/Gait Assistance --    Ambulation Distance (Feet)  --      Knee/Hip Exercises: Aerobic   Nustep Level 6 with BUEs & BLEs 10 minutes using theraband around knees to reduce excess hip ER and abduction of RLE      Prosthetics   Prosthetic Care Comments  PT recommending wearing prosthesis at all times out of bed.                      PT Short Term Goals - 09/08/19 1315      PT SHORT TERM GOAL #1   Title Patient & wife verbalize general understanding of adjusting ply socks with  limb volume changes.    (All Target STGs: 10/08/2019)    Time 1    Period Months    Status On-going    Target Date 10/08/19      PT SHORT TERM GOAL #2   Title Sit to / from stand w/c to RW with wife's assistance and maintains upright for 2 minutes with RW support with minA.    Time 1    Period Months    Status On-going    Target Date 10/08/19      PT SHORT TERM GOAL #3   Title Stand pivot transfer with RW with modA    Baseline NOT MET 09/08/2019 due to patient unable to attend PT over last month    Time 1    Period Months    Status On-going    Target Date 10/08/19      PT SHORT TERM GOAL #4   Title Patient tolerates prosthesis wear >12 hours total /day without wound issues.    Baseline NOT MET 09/08/2019 due to patient unable to attend PT over last month    Time 1    Period Months    Status On-going    Target Date 10/08/19      PT SHORT TERM GOAL #5   Title Patient ambulates 71' with RW & prosthesis with modA.    Baseline NOT MET 09/08/2019 due to patient unable to attend PT over last month    Time 1    Period Months    Status On-going    Target Date 10/08/19             PT Long Term Goals - 08/06/19 2236      PT LONG TERM GOAL #1   Title Patient and wife verbalize & demonstrate understanding of proper prosthetic care & patient able to donne prosthesis modified independent to enable safe utilization of prosthesis. (All LTGs Target Date: 11/04/2019)    Time 3    Period Months    Status On-going    Target Date 11/04/19      PT LONG  TERM GOAL #2   Title Patient tolerates wear of prosthesis >90% of awake hours without skin or residual limb pain issues to enable function during his day.    Time 3    Period Months    Status On-going    Target Date 11/04/19      PT LONG TERM GOAL #3   Title Standing balance with RW support: maintains upright 2 minutes, reaches 5" anteriorly and scans environment with supervision.    Time 3    Period Months    Status On-going    Target Date 11/04/19      PT LONG TERM GOAL #4   Title Patient ambulates 100' with RW & prosthesis with wife's assistance / supervision and turns 180* to position to sit safely.    Time 3    Period Months    Status On-going    Target Date 11/04/19      PT LONG TERM GOAL #5   Title Sit to/from stand and stand-pivot transfers with RW & prosthesis with supervision.    Time 3    Period Months    Status On-going    Target Date 11/04/19                 Plan - 09/21/19 1200    Clinical Impression Statement Patient's car transfer was improved using sliding board & prosthesis.  PT worked on Technical brewer  transfers using RW and he still tries to move prosthesis at same time as RW causing increased issues.    Personal Factors and Comorbidities Age;Comorbidity 3+;Fitness;Past/Current Experience;Time since onset of injury/illness/exacerbation    Comorbidities Rt TTA, C2-5 Cervical Fusion 2014, Tachypnea, encephalopathy, RLE paralysis, TIA, A-Fib, CAD, CHF, CKD stage 3, Sz disorder, hx of vertigo, knee arthroplasty Rt X 2 & Lt X 1    Examination-Activity Limitations Caring for Others;Locomotion Level;Reach Overhead;Squat;Stairs;Stand;Transfers    Examination-Participation Restrictions Community Activity    Stability/Clinical Decision Making Evolving/Moderate complexity    Rehab Potential Good    PT Frequency 2x / week    PT Duration Other (comment)   13 weeks (90 days)   PT Treatment/Interventions ADLs/Self Care Home Management;DME Instruction;Gait  training;Stair training;Functional mobility training;Therapeutic activities;Therapeutic exercise;Balance training;Neuromuscular re-education;Patient/family education;Prosthetic Training;Passive range of motion;Vestibular    PT Next Visit Plan work towards updated STGs,  continue to encourage transfers with sliding board / prosthesis, NuStep, weight machines including leg press, prosthetic gait with RW, standing balance exercises    Consulted and Agree with Plan of Care Patient;Family member/caregiver    Family Member Consulted wife, Jarrid Lienhard           Patient will benefit from skilled therapeutic intervention in order to improve the following deficits and impairments:  Abnormal gait, Cardiopulmonary status limiting activity, Decreased activity tolerance, Decreased balance, Decreased endurance, Decreased knowledge of use of DME, Decreased mobility, Decreased range of motion, Decreased skin integrity, Decreased strength, Difficulty walking, Increased edema, Impaired flexibility, Postural dysfunction, Prosthetic Dependency, Pain  Visit Diagnosis: Unsteadiness on feet  Other abnormalities of gait and mobility  Abnormal posture  Stiffness of right knee, not elsewhere classified  Muscle weakness (generalized)  History of falling  Contracture of muscle, multiple sites     Problem List Patient Active Problem List   Diagnosis Date Noted  . Dehiscence of amputation stump ([place])   . Wound infection 05/09/2018  . AKI (acute kidney injury) ([place])   . S/P BKA (below knee amputation) unilateral, right ([place])   . Below-knee amputation with complication, initial encounter ([place])   . Tachypnea   . Post-operative pain   . Subacute osteomyelitis, right ankle and foot ([place])   . Paralysis of right lower extremity ([place])   . TIA (transient ischemic attack) 03/24/2018  . Encephalopathy 03/24/2018  . Cellulitis 03/15/2018  . Hypernatremia 03/15/2018  . Persistent atrial fibrillation ([place])  11/10/2017  . Severe muscle deconditioning 11/10/2017  . Hemorrhage 10/22/2017  . CAD S/P percutaneous coronary angioplasty 10/22/2017  . Acute hypoxemic respiratory failure ([place]) 07/17/2017  . Aspiration syndrome, subsequent encounter 07/11/2017  . Aspiration pneumonia ([place]) 07/01/2017  . Acute encephalopathy 07/01/2017  . Thrombocytopenia ([place]) 07/01/2017  . Chronic diastolic (congestive) heart failure ([place]) 07/01/2017  . Anemia of chronic disease 07/01/2017  . Troponin level elevated 07/01/2017  . Chronic atrial fibrillation 07/01/2017  . Goals of care, counseling/discussion   . Palliative care by specialist   . Acute metabolic encephalopathy 74/94/4967  . CKD (chronic kidney disease) stage 3, GFR 30-59 ml/min   . CAP (community acquired pneumonia) 04/17/2017  . Hallux rigidus, right foot 03/10/2016  . Lumbosacral spondylosis without myelopathy 10/29/2015  . Memory difficulty 09/22/2015  . Abnormality of gait 09/22/2015  . Hyperglycemia 07/06/2015  . Chronic pain 07/06/2015  . Chronic insomnia 03/30/2015  . Transient alteration of awareness 03/30/2015  . Abnormal liver function   . Altered mental status 01/31/2015  . Essential hypertension 01/31/2015  . Constipation 01/31/2015  .  Hypothyroidism 01/31/2015  . Seizure disorder ([place]) 01/31/2015  . Bladder outlet obstruction 01/31/2015  . GERD (gastroesophageal reflux disease) 01/31/2015  . Chronic back pain 01/31/2015  . Acute kidney injury ([place]) 01/31/2015    Jamey Reas PT, DPT 09/21/2019, 9:13 PM  [facility] Physical Therapy [address] [place], [place], [address] Phone: [phone]   Fax:  [phone]  Name: [name] MRN: [id number] Date of Birth: 08-23-1933

## 2019-09-23 ENCOUNTER — Encounter: Payer: Self-pay | Admitting: Physical Therapy

## 2019-09-23 ENCOUNTER — Ambulatory Visit (INDEPENDENT_AMBULATORY_CARE_PROVIDER_SITE_OTHER): Payer: Medicare Other | Admitting: Physical Therapy

## 2019-09-23 ENCOUNTER — Other Ambulatory Visit: Payer: Self-pay

## 2019-09-23 DIAGNOSIS — R2681 Unsteadiness on feet: Secondary | ICD-10-CM | POA: Diagnosis not present

## 2019-09-23 DIAGNOSIS — M25661 Stiffness of right knee, not elsewhere classified: Secondary | ICD-10-CM | POA: Diagnosis not present

## 2019-09-23 DIAGNOSIS — R293 Abnormal posture: Secondary | ICD-10-CM | POA: Diagnosis not present

## 2019-09-23 DIAGNOSIS — R2689 Other abnormalities of gait and mobility: Secondary | ICD-10-CM

## 2019-09-23 DIAGNOSIS — M6281 Muscle weakness (generalized): Secondary | ICD-10-CM

## 2019-09-23 DIAGNOSIS — Z9181 History of falling: Secondary | ICD-10-CM

## 2019-09-23 DIAGNOSIS — M6249 Contracture of muscle, multiple sites: Secondary | ICD-10-CM

## 2019-09-23 NOTE — Therapy (Signed)
[place], [place], [address] Phone: [phone]   Fax:  [phone]  Physical Therapy Treatment  Patient Details  Name: [name] MRN: [id number] Date of Birth: Jul 08, 1933 Referring Provider (PT): Bevely Palmer Persons, [place]   Encounter Date: 09/23/2019   PT End of Session - 09/23/19 1253    Visit Number 29    Number of Visits 48    Date for PT Re-Evaluation 11/04/19    Authorization Type BCBS & Medicare    PT Start Time 1149    PT Stop Time 1230    PT Time Calculation (min) 41 min    Equipment Utilized During Treatment Gait belt    Activity Tolerance Patient tolerated treatment well;Patient limited by fatigue    Behavior During Therapy [facility] for tasks assessed/performed           Past Medical History:  Diagnosis Date  . Abnormality of gait 09/22/2015  . Arthritis   . Atrial fibrillation ([place])   . CAD (coronary artery disease)    Stent to RCA, Penta stent, 99% reduced to 0% 2002.  . Cancer ([place])    skin CA removed from back  . Chronic insomnia 03/30/2015  . Complication of anesthesia    trouble waking up  . GERD (gastroesophageal reflux disease)   . Hypercholesteremia   . Hypertension   . Hypothyroidism   . Memory difficulty 09/22/2015  . Osteoarthritis   . Pneumonia   . Seizures ([place])   . Sepsis ([place]) 05/2017  . Transient alteration of awareness 03/30/2015  . Vertigo    hx of    Past Surgical History:  Procedure Laterality Date  . AMPUTATION Right 03/28/2018   Procedure: AMPUTATION BELOW KNEE;  Surgeon: Newt Minion, MD;  Location: [facility];  Service: Orthopedics;  Laterality: Right;  . APPLICATION OF WOUND VAC Right 01/23/2019   Procedure: Application Of  Prevena Wound Vac;  Surgeon: Newt Minion, MD;  Location: [facility];  Service: Orthopedics;  Laterality: Right;  . BACK SURGERY    . EYE SURGERY     Bilateral Cataract surgery   . HERNIA REPAIR    . I & D EXTREMITY Right 05/10/2015   Procedure:  IRRIGATION AND DEBRIDEMENT EXTREMITY;  Surgeon: Leanora Cover, MD;  Location: [facility];  Service: Orthopedics;  Laterality: Right;  . KNEE ARTHROPLASTY     right knee X 2; left knee once  . LAMINECTOMY     X 6  . LEG AMPUTATION BELOW KNEE Right 03/28/2018  . POSTERIOR CERVICAL FUSION/FORAMINOTOMY  01/28/2012   Procedure: POSTERIOR CERVICAL FUSION/FORAMINOTOMY LEVEL 3;  Surgeon: [name], MD;  Location: [facility];  Service: Neurosurgery;  Laterality: Left;  Posterior Cervical Five-Thoracic One Fusion, Arthrodesis with LEFT Cervical Seven-thoracic One Laminectomy, Foraminotomy and Resection of Synovial Cyst  . POSTERIOR CERVICAL FUSION/FORAMINOTOMY N/A 01/29/2013   Procedure: POSTERIOR CERVICAL FUSION/FORAMINOTOMY LEVEL 1 and C2-5 Posteriolateral Arthrodesis;  Surgeon: [name], MD;  Location: [facility];  Service: Neurosurgery;  Laterality: N/A;  C2-C3 Laminectomy C2-C3 posterior cervical arthrodesis  . STUMP REVISION Right 01/23/2019   Procedure: REVISION RIGHT BELOW KNEE AMPUTATION;  Surgeon: Newt Minion, MD;  Location: [facility];  Service: Orthopedics;  Laterality: Right;  . TONSILLECTOMY      There were no vitals filed for this visit.   Subjective Assessment - 09/23/19 1150    Subjective He has been wearing prosthesis when out of bed but still spending a lot  of time in bed.  He did not use prosthesis for car transfers to come to PT today because pressed for time.    Patient is accompained by: Family member   wife - [name]   Pertinent History Rt TTA, C2-5 Cervical Fusion 2014, Tachypnea, encephalopathy, RLE paralysis, TIA, A-Fib, CAD, CHF, CKD stage 3, Sz disorder, hx of vertigo, knee arthroplasty Rt X 2 & Lt X 1    Limitations Lifting;Standing;Walking;House hold activities    Patient Stated Goals To get prosthesis, go to bathroom alone, get around house, get out in community.    Currently in Pain? Yes    Pain Score 6     Pain Location Other  (Comment)   arthritic with right hip & left shoulder significant   Pain Orientation Right;Left    Pain Descriptors / Indicators Aching;Sore    Pain Type Chronic pain    Pain Onset More than a month ago    Pain Frequency Constant    Aggravating Factors  arthritis, movements    Pain Relieving Factors medication & rest with support    Effect of Pain on Daily Activities limits movemetnt    Pain Onset More than a month ago                             [facility] Adult PT Treatment/Exercise - 09/23/19 1150      Transfers   Transfers Sit to Stand;Stand to Sit;Lateral/Scoot Transfers;Squat Pivot Transfers    Sit to Stand 3: Mod assist;With upper extremity assist;With armrests;From chair/3-in-1;Other (comment);2: Max assist   to RW, MaxA from chair & [place] from 24" stool   Sit to Stand Details Tactile cues for weight shifting;Visual cues for safe use of DME/AE;Verbal cues for technique;Verbal cues for sequencing;Verbal cues for safe use of DME/AE    Stand to Sit 4: Min assist;With upper extremity assist;With armrests;To chair/3-in-1;Other (comment)   from RW    Stand to Sit Details (indicate cue type and reason) Tactile cues for weight shifting;Visual cues for safe use of DME/AE;Verbal cues for technique;Verbal cues for sequencing;Verbal cues for safe use of DME/AE    Stand Pivot Transfers --   Nustep to w/c with RW & prosthesis   Squat Pivot Transfers With upper extremity assistance;3: Mod assist   w/c to Leg Press (3" higher)     Neuro Re-ed    Neuro Re-ed Details  sitting on 24" stool to facilitate core stability UE resistance red theraband: rows alternating & BUEs 10 reps ea. forward punch alternating & BUEs 5 reps each.       Knee/Hip Exercises: Machines for Strengthening   Cybex Leg Press shuttle leg press 50# 15 reps with back 45* & 15 reps back flat.  ball between LEs & belt around knees to facilitate alignment.       Knee/Hip Exercises: Seated   Other Seated Knee/Hip  Exercises w/c propulsion with LEs with PT advancing & manual / verbal cues for motion for 10 reps / LE     Abduction/Adduction  AROM;Strengthening;Both;10 reps    Abd/Adduction Limitations squeeze ball isometric adductor sqeeze & single leg isometric abduction against belt 3 sec hold 5 reps      Prosthetics   Prosthetic Care Comments  PT recommending wearing prosthesis at all times out of bed.  PT reviewed donning liner without creases or wrinkles.     Education Provided Proper Donning;Other (comment)   see prosthetic care comments.  Person(s) Educated Patient;Spouse    Education Method Explanation;Demonstration;Verbal cues    Education Method Verbalized understanding;Needs further instruction;Verbal cues required    [employer] Prosthesis Supervision                    PT Short Term Goals - 09/08/19 1315      PT SHORT TERM GOAL #1   Title Patient & wife verbalize general understanding of adjusting ply socks with limb volume changes.    (All [employer] STGs: 10/08/2019)    Time 1    Period Months    Status On-going    Target Date 10/08/19      PT SHORT TERM GOAL #2   Title Sit to / from stand w/c to RW with wife's assistance and maintains upright for 2 minutes with RW support with minA.    Time 1    Period Months    Status On-going    Target Date 10/08/19      PT SHORT TERM GOAL #3   Title Stand pivot transfer with RW with modA    Baseline NOT MET 09/08/2019 due to patient unable to attend PT over last month    Time 1    Period Months    Status On-going    Target Date 10/08/19      PT SHORT TERM GOAL #4   Title Patient tolerates prosthesis wear >12 hours total /day without wound issues.    Baseline NOT MET 09/08/2019 due to patient unable to attend PT over last month    Time 1    Period Months    Status On-going    Target Date 10/08/19      PT SHORT TERM GOAL #5   Title Patient ambulates 6' with RW & prosthesis with modA.    Baseline NOT MET 09/08/2019 due to patient  unable to attend PT over last month    Time 1    Period Months    Status On-going    Target Date 10/08/19             PT Long Term Goals - 08/06/19 2236      PT LONG TERM GOAL #1   Title Patient and wife verbalize & demonstrate understanding of proper prosthetic care & patient able to donne prosthesis modified independent to enable safe utilization of prosthesis. (All LTGs [facility] Date: 11/04/2019)    Time 3    Period Months    Status On-going    Target Date 11/04/19      PT LONG TERM GOAL #2   Title Patient tolerates wear of prosthesis >90% of awake hours without skin or residual limb pain issues to enable function during his day.    Time 3    Period Months    Status On-going    Target Date 11/04/19      PT LONG TERM GOAL #3   Title Standing balance with RW support: maintains upright 2 minutes, reaches 5" anteriorly and scans environment with supervision.    Time 3    Period Months    Status On-going    Target Date 11/04/19      PT LONG TERM GOAL #4   Title Patient ambulates 100' with RW & prosthesis with wife's assistance / supervision and turns 180* to position to sit safely.    Time 3    Period Months    Status On-going    Target Date 11/04/19      PT LONG TERM GOAL #5  Title Sit to/from stand and stand-pivot transfers with RW & prosthesis with supervision.    Time 3    Period Months    Status On-going    Target Date 11/04/19                 Plan - 09/23/19 1255    Clinical Impression Statement PT worked on strengthening & core stabilization exercises today. He fatigued quicker today with HR increasing from mid-70's to upper 80's for most activities and 101 after UE resistance seated on 24" stool.    Personal Factors and Comorbidities Age;Comorbidity 3+;Fitness;Past/Current Experience;Time since onset of injury/illness/exacerbation    Comorbidities Rt TTA, C2-5 Cervical Fusion 2014, Tachypnea, encephalopathy, RLE paralysis, TIA, A-Fib, CAD, CHF, CKD  stage 3, Sz disorder, hx of vertigo, knee arthroplasty Rt X 2 & Lt X 1    Examination-Activity Limitations Caring for Others;Locomotion Level;Reach Overhead;Squat;Stairs;Stand;Transfers    Examination-Participation Restrictions Community Activity    Stability/Clinical Decision Making Evolving/Moderate complexity    Rehab Potential Good    PT Frequency 2x / week    PT Duration Other (comment)   13 weeks (90 days)   PT Treatment/Interventions ADLs/Self Care Home Management;DME Instruction;Gait training;Stair training;Functional mobility training;Therapeutic activities;Therapeutic exercise;Balance training;Neuromuscular re-education;Patient/family education;Prosthetic Training;Passive range of motion;Vestibular    PT Next Visit Plan work towards updated STGs,  continue to encourage transfers with sliding board / prosthesis, NuStep, weight machines including leg press, prosthetic gait with RW, standing balance exercises    Consulted and Agree with Plan of Care Patient;Family member/caregiver    Family Member Consulted wife, Mike Hamre           Patient will benefit from skilled therapeutic intervention in order to improve the following deficits and impairments:  Abnormal gait, Cardiopulmonary status limiting activity, Decreased activity tolerance, Decreased balance, Decreased endurance, Decreased knowledge of use of DME, Decreased mobility, Decreased range of motion, Decreased skin integrity, Decreased strength, Difficulty walking, Increased edema, Impaired flexibility, Postural dysfunction, Prosthetic Dependency, Pain  Visit Diagnosis: Unsteadiness on feet  Other abnormalities of gait and mobility  Abnormal posture  Stiffness of right knee, not elsewhere classified  Muscle weakness (generalized)  History of falling  Contracture of muscle, multiple sites     Problem List Patient Active Problem List   Diagnosis Date Noted  . Dehiscence of amputation stump ([place])   . Wound  infection 05/09/2018  . AKI (acute kidney injury) ([place])   . S/P BKA (below knee amputation) unilateral, right ([place])   . Below-knee amputation with complication, initial encounter ([place])   . Tachypnea   . Post-operative pain   . Subacute osteomyelitis, right ankle and foot ([place])   . Paralysis of right lower extremity ([place])   . TIA (transient ischemic attack) 03/24/2018  . Encephalopathy 03/24/2018  . Cellulitis 03/15/2018  . Hypernatremia 03/15/2018  . Persistent atrial fibrillation ([place]) 11/10/2017  . Severe muscle deconditioning 11/10/2017  . Hemorrhage 10/22/2017  . CAD S/P percutaneous coronary angioplasty 10/22/2017  . Acute hypoxemic respiratory failure ([place]) 07/17/2017  . Aspiration syndrome, subsequent encounter 07/11/2017  . Aspiration pneumonia ([place]) 07/01/2017  . Acute encephalopathy 07/01/2017  . Thrombocytopenia ([place]) 07/01/2017  . Chronic diastolic (congestive) heart failure ([place]) 07/01/2017  . Anemia of chronic disease 07/01/2017  . Troponin level elevated 07/01/2017  . Chronic atrial fibrillation 07/01/2017  . Goals of care, counseling/discussion   . Palliative care by specialist   . Acute metabolic encephalopathy 32/67/1245  . CKD (chronic kidney disease) stage 3, GFR 30-59 ml/min   .  CAP (community acquired pneumonia) 04/17/2017  . Hallux rigidus, right foot 03/10/2016  . Lumbosacral spondylosis without myelopathy 10/29/2015  . Memory difficulty 09/22/2015  . Abnormality of gait 09/22/2015  . Hyperglycemia 07/06/2015  . Chronic pain 07/06/2015  . Chronic insomnia 03/30/2015  . Transient alteration of awareness 03/30/2015  . Abnormal liver function   . Altered mental status 01/31/2015  . Essential hypertension 01/31/2015  . Constipation 01/31/2015  . Hypothyroidism 01/31/2015  . Seizure disorder ([place]) 01/31/2015  . Bladder outlet obstruction 01/31/2015  . GERD (gastroesophageal reflux disease) 01/31/2015  . Chronic back pain 01/31/2015  . Acute kidney  injury ([place]) 01/31/2015    Jamey Reas PT, DPT 09/23/2019, 1:00 PM  [facility] Physical Therapy [address] [place], [place], [address] Phone: [phone]   Fax:  [phone]  Name: [name] MRN: [id number] Date of Birth: October 11, 1933

## 2019-09-28 ENCOUNTER — Ambulatory Visit (INDEPENDENT_AMBULATORY_CARE_PROVIDER_SITE_OTHER): Payer: Medicare Other | Admitting: Physical Therapy

## 2019-09-28 ENCOUNTER — Encounter: Payer: Self-pay | Admitting: Physical Therapy

## 2019-09-28 ENCOUNTER — Ambulatory Visit: Payer: Medicare Other | Admitting: Podiatry

## 2019-09-28 ENCOUNTER — Other Ambulatory Visit: Payer: Self-pay

## 2019-09-28 DIAGNOSIS — M6249 Contracture of muscle, multiple sites: Secondary | ICD-10-CM

## 2019-09-28 DIAGNOSIS — R2681 Unsteadiness on feet: Secondary | ICD-10-CM

## 2019-09-28 DIAGNOSIS — M25661 Stiffness of right knee, not elsewhere classified: Secondary | ICD-10-CM | POA: Diagnosis not present

## 2019-09-28 DIAGNOSIS — R2689 Other abnormalities of gait and mobility: Secondary | ICD-10-CM | POA: Diagnosis not present

## 2019-09-28 DIAGNOSIS — Z9181 History of falling: Secondary | ICD-10-CM

## 2019-09-28 DIAGNOSIS — M6281 Muscle weakness (generalized): Secondary | ICD-10-CM

## 2019-09-28 DIAGNOSIS — M542 Cervicalgia: Secondary | ICD-10-CM

## 2019-09-28 DIAGNOSIS — R293 Abnormal posture: Secondary | ICD-10-CM | POA: Diagnosis not present

## 2019-09-28 NOTE — Therapy (Signed)
[facility] [place], [place], [address] Phone: [phone]   Fax:  [phone]  Physical Therapy Treatment & 10th Visit Note  Patient Details  Name: [name] MRN: [id number] Date of Birth: August 13, 1933 Referring Provider (PT): [name], [place]   Encounter Date: 09/28/2019   Progress Note Reporting Period 08/04/2019 to 7/192021  See note below for Objective Data and Assessment of Progress/Goals.        PT End of Session - 09/28/19 1150    Visit Number 30    Number of Visits 48    Date for PT Re-Evaluation 11/04/19    Authorization Type BCBS & Medicare    PT Start Time 1145    PT Stop Time 1234    PT Time Calculation (min) 49 min    Equipment Utilized During Treatment Gait belt    Activity Tolerance Patient tolerated treatment well;Patient limited by fatigue    Behavior During Therapy WFL for tasks assessed/performed           Past Medical History:  Diagnosis Date  . Abnormality of gait 09/22/2015  . Arthritis   . Atrial fibrillation ([place])   . CAD (coronary artery disease)    Stent to RCA, Penta stent, 99% reduced to 0% 2002.  . Cancer ([place])    skin CA removed from back  . Chronic insomnia 03/30/2015  . Complication of anesthesia    trouble waking up  . GERD (gastroesophageal reflux disease)   . Hypercholesteremia   . Hypertension   . Hypothyroidism   . Memory difficulty 09/22/2015  . Osteoarthritis   . Pneumonia   . Seizures ([place])   . Sepsis ([place]) 05/2017  . Transient alteration of awareness 03/30/2015  . Vertigo    hx of    Past Surgical History:  Procedure Laterality Date  . AMPUTATION Right 03/28/2018   Procedure: AMPUTATION BELOW KNEE;  Surgeon: Newt Minion, MD;  Location: [facility];  Service: Orthopedics;  Laterality: Right;  . APPLICATION OF WOUND VAC Right 01/23/2019   Procedure: Application Of  Prevena Wound Vac;  Surgeon: Newt Minion, MD;  Location: [facility];  Service: Orthopedics;   Laterality: Right;  . BACK SURGERY    . EYE SURGERY     Bilateral Cataract surgery   . HERNIA REPAIR    . I & D EXTREMITY Right 05/10/2015   Procedure: IRRIGATION AND DEBRIDEMENT EXTREMITY;  Surgeon: Leanora Cover, MD;  Location: [facility];  Service: Orthopedics;  Laterality: Right;  . KNEE ARTHROPLASTY     right knee X 2; left knee once  . LAMINECTOMY     X 6  . LEG AMPUTATION BELOW KNEE Right 03/28/2018  . POSTERIOR CERVICAL FUSION/FORAMINOTOMY  01/28/2012   Procedure: POSTERIOR CERVICAL FUSION/FORAMINOTOMY LEVEL 3;  Surgeon: [name], MD;  Location: [facility];  Service: Neurosurgery;  Laterality: Left;  Posterior Cervical Five-Thoracic One Fusion, Arthrodesis with LEFT Cervical Seven-thoracic One Laminectomy, Foraminotomy and Resection of Synovial Cyst  . POSTERIOR CERVICAL FUSION/FORAMINOTOMY N/A 01/29/2013   Procedure: POSTERIOR CERVICAL FUSION/FORAMINOTOMY LEVEL 1 and C2-5 Posteriolateral Arthrodesis;  Surgeon: [name], MD;  Location: [facility];  Service: Neurosurgery;  Laterality: N/A;  C2-C3 Laminectomy C2-C3 posterior cervical arthrodesis  . STUMP REVISION Right 01/23/2019   Procedure: REVISION RIGHT BELOW KNEE AMPUTATION;  Surgeon: Newt Minion, MD;  Location: [facility];  Service: Orthopedics;  Laterality: Right;  . TONSILLECTOMY      There were no vitals  filed for this visit.   Subjective Assessment - 09/28/19 1145    Subjective He seems to be getting closer to out of bed  hours prior to recent 2 weeks of issues.    Patient is accompained by: Family member   wife - [name]   Pertinent History Rt TTA, C2-5 Cervical Fusion 2014, Tachypnea, encephalopathy, RLE paralysis, TIA, A-Fib, CAD, CHF, CKD stage 3, Sz disorder, hx of vertigo, knee arthroplasty Rt X 2 & Lt X 1    Limitations Lifting;Standing;Walking;House hold activities    Patient Stated Goals To get prosthesis, go to bathroom alone, get around house, get out in community.    Currently  in Pain? Yes    Pain Score 7     Pain Location Other (Comment)    Pain Orientation Right;Left   left shoulder & right hip   Pain Descriptors / Indicators Aching;Sore    Pain Type Chronic pain    Pain Onset More than a month ago    Pain Frequency Constant    Aggravating Factors  arthritis, positioning, use of muscles in improper pattern    Pain Relieving Factors medications, repositioning arm or leg    Effect of Pain on Daily Activities limited activity tolerance    Pain Onset More than a month ago                             [facility] Adult PT Treatment/Exercise - 09/28/19 1145      Transfers   Transfers Sit to Stand;Stand to Sit;Lateral/Scoot Transfers;Squat Pivot Transfers    Sit to Stand 3: Mod assist;With upper extremity assist;With armrests;From chair/3-in-1;Other (comment)   to sink    Sit to Stand Details Tactile cues for weight shifting;Visual cues for safe use of DME/AE;Verbal cues for technique;Verbal cues for sequencing;Verbal cues for safe use of DME/AE    Stand to Sit 4: Min assist;With upper extremity assist;With armrests;To chair/3-in-1;Other (comment)   from sink   Stand to Sit Details (indicate cue type and reason) Tactile cues for weight shifting;Visual cues for safe use of DME/AE;Verbal cues for technique;Verbal cues for sequencing;Verbal cues for safe use of DME/AE    Squat Pivot Transfers 3: Mod assist;With upper extremity assistance   w/c to Leg Press (3" higher)   Squat Pivot Transfer Details (indicate cue type and reason) verbal cues on technique      Self-Care   Self-Care ADL's    ADL's PT demo & verbal cues on positioning in sidelying using pillows including "tenting" sheets off feet. Wife & patient verbalized understanding.       Knee/Hip Exercises: Aerobic   Nustep Level 6 with BUEs & BLEs 10 minutes using theraband around knees to reduce excess hip ER and abduction of RLE      Knee/Hip Exercises: Machines for Strengthening   Cybex Leg Press  --      Knee/Hip Exercises: Seated   Other Seated Knee/Hip Exercises --    Abduction/Adduction  --    Abd/Adduction Limitations --      Prosthetics   Prosthetic Care Comments  PT recommending wearing prosthesis at all times out of bed.  PT reviewed donning liner without creases or wrinkles.     Education Provided Proper Donning;Other (comment)   see prosthetic care comments.    Person(s) Educated Patient;Spouse    Education Method Explanation;Verbal cues    Education Method Verbalized understanding;Verbal cues required  PT Short Term Goals - 09/08/19 1315      PT SHORT TERM GOAL #1   Title Patient & wife verbalize general understanding of adjusting ply socks with limb volume changes.    (All Target STGs: 10/08/2019)    Time 1    Period Months    Status On-going    Target Date 10/08/19      PT SHORT TERM GOAL #2   Title Sit to / from stand w/c to RW with wife's assistance and maintains upright for 2 minutes with RW support with minA.    Time 1    Period Months    Status On-going    Target Date 10/08/19      PT SHORT TERM GOAL #3   Title Stand pivot transfer with RW with modA    Baseline NOT MET 09/08/2019 due to patient unable to attend PT over last month    Time 1    Period Months    Status On-going    Target Date 10/08/19      PT SHORT TERM GOAL #4   Title Patient tolerates prosthesis wear >12 hours total /day without wound issues.    Baseline NOT MET 09/08/2019 due to patient unable to attend PT over last month    Time 1    Period Months    Status On-going    Target Date 10/08/19      PT SHORT TERM GOAL #5   Title Patient ambulates 55' with RW & prosthesis with modA.    Baseline NOT MET 09/08/2019 due to patient unable to attend PT over last month    Time 1    Period Months    Status On-going    Target Date 10/08/19             PT Long Term Goals - 08/06/19 2236      PT LONG TERM GOAL #1   Title Patient and wife verbalize &  demonstrate understanding of proper prosthetic care & patient able to donne prosthesis modified independent to enable safe utilization of prosthesis. (All LTGs Target Date: 11/04/2019)    Time 3    Period Months    Status On-going    Target Date 11/04/19      PT LONG TERM GOAL #2   Title Patient tolerates wear of prosthesis >90% of awake hours without skin or residual limb pain issues to enable function during his day.    Time 3    Period Months    Status On-going    Target Date 11/04/19      PT LONG TERM GOAL #3   Title Standing balance with RW support: maintains upright 2 minutes, reaches 5" anteriorly and scans environment with supervision.    Time 3    Period Months    Status On-going    Target Date 11/04/19      PT LONG TERM GOAL #4   Title Patient ambulates 100' with RW & prosthesis with wife's assistance / supervision and turns 180* to position to sit safely.    Time 3    Period Months    Status On-going    Target Date 11/04/19      PT LONG TERM GOAL #5   Title Sit to/from stand and stand-pivot transfers with RW & prosthesis with supervision.    Time 3    Period Months    Status On-going    Target Date 11/04/19  Plan - 09/28/19 1151    Clinical Impression Statement PT, patient & his wife had discussion of rationale of LTGs, his need for compliance with activities recommended within his limitations safely and motivation as key to achieving a level of function to minimize burden of care on wife.  Pt & wife verbalized understading & agreement.  Patient was improving his function then had set back when out of PT for >2 weeks. He is slowly recovering again.    Personal Factors and Comorbidities Age;Comorbidity 3+;Fitness;Past/Current Experience;Time since onset of injury/illness/exacerbation    Comorbidities Rt TTA, C2-5 Cervical Fusion 2014, Tachypnea, encephalopathy, RLE paralysis, TIA, A-Fib, CAD, CHF, CKD stage 3, Sz disorder, hx of vertigo, knee  arthroplasty Rt X 2 & Lt X 1    Examination-Activity Limitations Caring for Others;Locomotion Level;Reach Overhead;Squat;Stairs;Stand;Transfers    Examination-Participation Restrictions Community Activity    Stability/Clinical Decision Making Evolving/Moderate complexity    Rehab Potential Good    PT Frequency 2x / week    PT Duration Other (comment)   13 weeks (90 days)   PT Treatment/Interventions ADLs/Self Care Home Management;DME Instruction;Gait training;Stair training;Functional mobility training;Therapeutic activities;Therapeutic exercise;Balance training;Neuromuscular re-education;Patient/family education;Prosthetic Training;Passive range of motion;Vestibular    PT Next Visit Plan work towards updated STGs,  continue to encourage transfers with sliding board / prosthesis, NuStep, weight machines including leg press, prosthetic gait with RW, standing balance exercises    Consulted and Agree with Plan of Care Patient;Family member/caregiver    Family Member Consulted wife, Filimon Miranda           Patient will benefit from skilled therapeutic intervention in order to improve the following deficits and impairments:  Abnormal gait, Cardiopulmonary status limiting activity, Decreased activity tolerance, Decreased balance, Decreased endurance, Decreased knowledge of use of DME, Decreased mobility, Decreased range of motion, Decreased skin integrity, Decreased strength, Difficulty walking, Increased edema, Impaired flexibility, Postural dysfunction, Prosthetic Dependency, Pain  Visit Diagnosis: Unsteadiness on feet  Other abnormalities of gait and mobility  Abnormal posture  Stiffness of right knee, not elsewhere classified  Muscle weakness (generalized)  History of falling  Contracture of muscle, multiple sites  Cervicalgia     Problem List Patient Active Problem List   Diagnosis Date Noted  . Dehiscence of amputation stump ([place])   . Wound infection 05/09/2018  . AKI  (acute kidney injury) ([place])   . S/P BKA (below knee amputation) unilateral, right ([place])   . Below-knee amputation with complication, initial encounter ([place])   . Tachypnea   . Post-operative pain   . Subacute osteomyelitis, right ankle and foot ([place])   . Paralysis of right lower extremity ([place])   . TIA (transient ischemic attack) 03/24/2018  . Encephalopathy 03/24/2018  . Cellulitis 03/15/2018  . Hypernatremia 03/15/2018  . Persistent atrial fibrillation ([place]) 11/10/2017  . Severe muscle deconditioning 11/10/2017  . Hemorrhage 10/22/2017  . CAD S/P percutaneous coronary angioplasty 10/22/2017  . Acute hypoxemic respiratory failure ([place]) 07/17/2017  . Aspiration syndrome, subsequent encounter 07/11/2017  . Aspiration pneumonia ([place]) 07/01/2017  . Acute encephalopathy 07/01/2017  . Thrombocytopenia ([place]) 07/01/2017  . Chronic diastolic (congestive) heart failure ([place]) 07/01/2017  . Anemia of chronic disease 07/01/2017  . Troponin level elevated 07/01/2017  . Chronic atrial fibrillation 07/01/2017  . Goals of care, counseling/discussion   . Palliative care by specialist   . Acute metabolic encephalopathy 46/27/0350  . CKD (chronic kidney disease) stage 3, GFR 30-59 ml/min   . CAP (community acquired pneumonia) 04/17/2017  . Hallux rigidus, right foot  03/10/2016  . Lumbosacral spondylosis without myelopathy 10/29/2015  . Memory difficulty 09/22/2015  . Abnormality of gait 09/22/2015  . Hyperglycemia 07/06/2015  . Chronic pain 07/06/2015  . Chronic insomnia 03/30/2015  . Transient alteration of awareness 03/30/2015  . Abnormal liver function   . Altered mental status 01/31/2015  . Essential hypertension 01/31/2015  . Constipation 01/31/2015  . Hypothyroidism 01/31/2015  . Seizure disorder ([place]) 01/31/2015  . Bladder outlet obstruction 01/31/2015  . GERD (gastroesophageal reflux disease) 01/31/2015  . Chronic back pain 01/31/2015  . Acute kidney injury ([place]) 01/31/2015     Jamey Reas  PT, DPT 09/28/2019, 2:49 PM  [facility] [address] [place], [place], [address] Phone: [phone]   Fax:  [phone]  Name: [name] MRN: [id number] Date of Birth: 1933-04-06

## 2019-09-30 ENCOUNTER — Ambulatory Visit (INDEPENDENT_AMBULATORY_CARE_PROVIDER_SITE_OTHER): Payer: Medicare Other | Admitting: Physical Therapy

## 2019-09-30 ENCOUNTER — Encounter: Payer: Self-pay | Admitting: Podiatry

## 2019-09-30 ENCOUNTER — Other Ambulatory Visit: Payer: Self-pay

## 2019-09-30 ENCOUNTER — Ambulatory Visit (INDEPENDENT_AMBULATORY_CARE_PROVIDER_SITE_OTHER): Payer: Medicare Other | Admitting: Podiatry

## 2019-09-30 ENCOUNTER — Encounter: Payer: Self-pay | Admitting: Physical Therapy

## 2019-09-30 DIAGNOSIS — R293 Abnormal posture: Secondary | ICD-10-CM

## 2019-09-30 DIAGNOSIS — R2681 Unsteadiness on feet: Secondary | ICD-10-CM | POA: Diagnosis not present

## 2019-09-30 DIAGNOSIS — M542 Cervicalgia: Secondary | ICD-10-CM

## 2019-09-30 DIAGNOSIS — M25661 Stiffness of right knee, not elsewhere classified: Secondary | ICD-10-CM | POA: Diagnosis not present

## 2019-09-30 DIAGNOSIS — M6281 Muscle weakness (generalized): Secondary | ICD-10-CM

## 2019-09-30 DIAGNOSIS — Z9181 History of falling: Secondary | ICD-10-CM

## 2019-09-30 DIAGNOSIS — R2689 Other abnormalities of gait and mobility: Secondary | ICD-10-CM

## 2019-09-30 DIAGNOSIS — I739 Peripheral vascular disease, unspecified: Secondary | ICD-10-CM | POA: Diagnosis not present

## 2019-09-30 DIAGNOSIS — Z89511 Acquired absence of right leg below knee: Secondary | ICD-10-CM

## 2019-09-30 DIAGNOSIS — B351 Tinea unguium: Secondary | ICD-10-CM | POA: Diagnosis not present

## 2019-09-30 DIAGNOSIS — M79675 Pain in left toe(s): Secondary | ICD-10-CM | POA: Diagnosis not present

## 2019-09-30 DIAGNOSIS — M6249 Contracture of muscle, multiple sites: Secondary | ICD-10-CM

## 2019-09-30 MED ORDER — MUPIROCIN 2 % EX OINT: 1.0000 "application " | TOPICAL_OINTMENT | Freq: Every day | CUTANEOUS | 0 refills | Status: DC

## 2019-09-30 NOTE — Therapy (Signed)
[facility] [address] [place], [place], [address] Phone: [phone]   Fax:  [phone]  Physical Therapy Treatment  Patient Details  Name: [name] MRN: [id number] Date of Birth: 1933-10-15 Referring Provider (PT): [place] Persons, [place]   Encounter Date: 09/30/2019   PT End of Session - 09/30/19 1503    Visit Number 31    Number of Visits 48    Date for PT Re-Evaluation 11/04/19    Authorization Type BCBS & Medicare    PT Start Time 1145    PT Stop Time 1230    PT Time Calculation (min) 45 min    Equipment Utilized During Treatment Gait belt    Activity Tolerance Patient tolerated treatment well;Patient limited by fatigue    Behavior During Therapy [facility] for tasks assessed/performed           Past Medical History:  Diagnosis Date  . Abnormality of gait 09/22/2015  . Arthritis   . Atrial fibrillation (HCC)   . CAD (coronary artery disease)    Stent to RCA, Penta stent, 99% reduced to 0% 2002.  . Cancer (HCC)    skin CA removed from back  . Chronic insomnia 03/30/2015  . Complication of anesthesia    trouble waking up  . GERD (gastroesophageal reflux disease)   . Hypercholesteremia   . Hypertension   . Hypothyroidism   . Memory difficulty 09/22/2015  . Osteoarthritis   . Pneumonia   . Seizures (HCC)   . Sepsis (HCC) 05/2017  . Transient alteration of awareness 03/30/2015  . Vertigo    hx of    Past Surgical History:  Procedure Laterality Date  . AMPUTATION Right 03/28/2018   Procedure: AMPUTATION BELOW KNEE;  Surgeon: Nadara Mustard, MD;  Location: [facility] OR;  Service: Orthopedics;  Laterality: Right;  . APPLICATION OF WOUND VAC Right 01/23/2019   Procedure: Application Of  Prevena Wound Vac;  Surgeon: Nadara Mustard, MD;  Location: [facility] OR;  Service: Orthopedics;  Laterality: Right;  . BACK SURGERY    . EYE SURGERY     Bilateral Cataract surgery   . HERNIA REPAIR    . I & D EXTREMITY Right 05/10/2015   Procedure:  IRRIGATION AND DEBRIDEMENT EXTREMITY;  Surgeon: Betha Loa, MD;  Location: [facility];  Service: Orthopedics;  Laterality: Right;  . KNEE ARTHROPLASTY     right knee X 2; left knee once  . LAMINECTOMY     X 6  . LEG AMPUTATION BELOW KNEE Right 03/28/2018  . POSTERIOR CERVICAL FUSION/FORAMINOTOMY  01/28/2012   Procedure: POSTERIOR CERVICAL FUSION/FORAMINOTOMY LEVEL 3;  Surgeon: [name], MD;  Location: [facility];  Service: Neurosurgery;  Laterality: Left;  Posterior Cervical Five-Thoracic One Fusion, Arthrodesis with LEFT Cervical Seven-thoracic One Laminectomy, Foraminotomy and Resection of Synovial Cyst  . POSTERIOR CERVICAL FUSION/FORAMINOTOMY N/A 01/29/2013   Procedure: POSTERIOR CERVICAL FUSION/FORAMINOTOMY LEVEL 1 and C2-5 Posteriolateral Arthrodesis;  Surgeon: [name], MD;  Location: [facility];  Service: Neurosurgery;  Laterality: N/A;  C2-C3 Laminectomy C2-C3 posterior cervical arthrodesis  . STUMP REVISION Right 01/23/2019   Procedure: REVISION RIGHT BELOW KNEE AMPUTATION;  Surgeon: Nadara Mustard, MD;  Location: [facility] OR;  Service: Orthopedics;  Laterality: Right;  . TONSILLECTOMY      There were no vitals filed for this visit.   Subjective Assessment - 09/30/19 1148    Subjective He has been using some light dumbbell weights for arm exercises. (PT recommended no  weights due to arthritic & postural issues with high risk for other injuries. Pt & wife verbalized understanding.)    Patient is accompained by: Family member   wife - [name]   Pertinent History Rt TTA, C2-5 Cervical Fusion 2014, Tachypnea, encephalopathy, RLE paralysis, TIA, A-Fib, CAD, CHF, CKD stage 3, Sz disorder, hx of vertigo, knee arthroplasty Rt X 2 & Lt X 1    Limitations Lifting;Standing;Walking;House hold activities    Patient Stated Goals To get prosthesis, go to bathroom alone, get around house, get out in community.    Currently in Pain? Yes    Pain Location Other  (Comment)   all over but left shoulder & right hip worst   Pain Descriptors / Indicators Aching;Sore    Pain Type Chronic pain;Other (Comment)   arthritic   Pain Onset More than a month ago    Pain Frequency Constant    Aggravating Factors  arthritis, certain movements    Pain Relieving Factors medication    Pain Onset More than a month ago                             [facility] Adult PT Treatment/Exercise - 09/30/19 1150      Transfers   Transfers Sit to Stand;Stand to Sit;Lateral/Scoot Transfers;Squat Pivot Transfers    Sit to Stand 3: Mod assist;With upper extremity assist;With armrests;From chair/3-in-1;Other (comment)   to sink    Sit to Stand Details Tactile cues for weight shifting;Visual cues for safe use of DME/AE;Verbal cues for technique;Verbal cues for sequencing;Verbal cues for safe use of DME/AE    Stand to Sit 4: Min assist;With upper extremity assist;With armrests;To chair/3-in-1;Other (comment)   from sink   Stand to Sit Details (indicate cue type and reason) Tactile cues for weight shifting;Visual cues for safe use of DME/AE;Verbal cues for technique;Verbal cues for sequencing;Verbal cues for safe use of DME/AE    Squat Pivot Transfers 3: Mod assist;With upper extremity assistance   w/c to Leg Press (3" higher)   Comments sit to/from stand pulling on sink with modA.  Patient maintained upright 1 minute with MinA      Self-Care   Self-Care --    ADL's --      Neuro Re-ed    Neuro Re-ed Details  sitting on 24" stool: active trunk motions with tactile cues forward lean with recovery,  lean back with recovery, rotation looking over shoulder and lateral lean right & left 5 reps ea.       Knee/Hip Exercises: Aerobic   Nustep Level 6 with BUEs & BLEs 10 minutes using theraband around knees to reduce excess hip ER and abduction of RLE      Knee/Hip Exercises: Seated   Other Seated Knee/Hip Exercises w/c propulsion with LEs with PT advancing & manual / verbal  cues for motion for 10 reps / LE       Prosthetics   Prosthetic Care Comments  PT recommending wearing prosthesis at all times out of bed.  PT reviewed donning liner without creases or wrinkles.     Current prosthetic wear tolerance (days/week)  daily     Current prosthetic wear tolerance (#hours/day)  >75% of out of bed time but limited    Current prosthetic weight-bearing tolerance (hours/day)  no pain reported with today's attempted weight bearing activities     Edema non-pitting edema     Education Provided Proper Donning;Other (comment)   see prosthetic care  comments.    Person(s) Educated Patient;Spouse    Education Method Explanation;Demonstration;Tactile cues;Verbal cues    Education Method Verbalized understanding;Tactile cues required;Verbal cues required;Needs further instruction                    PT Short Term Goals - 09/08/19 1315      PT SHORT TERM GOAL #1   Title Patient & wife verbalize general understanding of adjusting ply socks with limb volume changes.    (All Target STGs: 10/08/2019)    Time 1    Period Months    Status On-going    Target Date 10/08/19      PT SHORT TERM GOAL #2   Title Sit to / from stand w/c to RW with wife's assistance and maintains upright for 2 minutes with RW support with minA.    Time 1    Period Months    Status On-going    Target Date 10/08/19      PT SHORT TERM GOAL #3   Title Stand pivot transfer with RW with modA    Baseline NOT MET 09/08/2019 due to patient unable to attend PT over last month    Time 1    Period Months    Status On-going    Target Date 10/08/19      PT SHORT TERM GOAL #4   Title Patient tolerates prosthesis wear >12 hours total /day without wound issues.    Baseline NOT MET 09/08/2019 due to patient unable to attend PT over last month    Time 1    Period Months    Status On-going    Target Date 10/08/19      PT SHORT TERM GOAL #5   Title Patient ambulates 38' with RW & prosthesis with modA.     Baseline NOT MET 09/08/2019 due to patient unable to attend PT over last month    Time 1    Period Months    Status On-going    Target Date 10/08/19             PT Long Term Goals - 08/06/19 2236      PT LONG TERM GOAL #1   Title Patient and wife verbalize & demonstrate understanding of proper prosthetic care & patient able to donne prosthesis modified independent to enable safe utilization of prosthesis. (All LTGs Target Date: 11/04/2019)    Time 3    Period Months    Status On-going    Target Date 11/04/19      PT LONG TERM GOAL #2   Title Patient tolerates wear of prosthesis >90% of awake hours without skin or residual limb pain issues to enable function during his day.    Time 3    Period Months    Status On-going    Target Date 11/04/19      PT LONG TERM GOAL #3   Title Standing balance with RW support: maintains upright 2 minutes, reaches 5" anteriorly and scans environment with supervision.    Time 3    Period Months    Status On-going    Target Date 11/04/19      PT LONG TERM GOAL #4   Title Patient ambulates 100' with RW & prosthesis with wife's assistance / supervision and turns 180* to position to sit safely.    Time 3    Period Months    Status On-going    Target Date 11/04/19      PT LONG TERM GOAL #5  Title Sit to/from stand and stand-pivot transfers with RW & prosthesis with supervision.    Time 3    Period Months    Status On-going    Target Date 11/04/19                 Plan - 09/30/19 1504    Clinical Impression Statement Patient tolerated activities of PT session better today than last 2 weeks.  He tolerated standing at sink for 1 minute today. PT educated pt & his wife on communicaton to improve participation & lower risk of injury.    Personal Factors and Comorbidities Age;Comorbidity 3+;Fitness;Past/Current Experience;Time since onset of injury/illness/exacerbation    Comorbidities Rt TTA, C2-5 Cervical Fusion 2014, Tachypnea,  encephalopathy, RLE paralysis, TIA, A-Fib, CAD, CHF, CKD stage 3, Sz disorder, hx of vertigo, knee arthroplasty Rt X 2 & Lt X 1    Examination-Activity Limitations Caring for Others;Locomotion Level;Reach Overhead;Squat;Stairs;Stand;Transfers    Examination-Participation Restrictions Community Activity    Stability/Clinical Decision Making Evolving/Moderate complexity    Rehab Potential Good    PT Frequency 2x / week    PT Duration Other (comment)   13 weeks (90 days)   PT Treatment/Interventions ADLs/Self Care Home Management;DME Instruction;Gait training;Stair training;Functional mobility training;Therapeutic activities;Therapeutic exercise;Balance training;Neuromuscular re-education;Patient/family education;Prosthetic Training;Passive range of motion;Vestibular    PT Next Visit Plan check updated STGs,  continue to encourage transfers with sliding board / prosthesis, NuStep, weight machines including leg press, prosthetic gait with RW, standing balance exercises    Consulted and Agree with Plan of Care Patient;Family member/caregiver    Family Member Consulted wife, Murat Rideout           Patient will benefit from skilled therapeutic intervention in order to improve the following deficits and impairments:  Abnormal gait, Cardiopulmonary status limiting activity, Decreased activity tolerance, Decreased balance, Decreased endurance, Decreased knowledge of use of DME, Decreased mobility, Decreased range of motion, Decreased skin integrity, Decreased strength, Difficulty walking, Increased edema, Impaired flexibility, Postural dysfunction, Prosthetic Dependency, Pain  Visit Diagnosis: Unsteadiness on feet  Other abnormalities of gait and mobility  Abnormal posture  Stiffness of right knee, not elsewhere classified  Muscle weakness (generalized)  History of falling  Contracture of muscle, multiple sites  Cervicalgia     Problem List Patient Active Problem List   Diagnosis Date  Noted  . Dehiscence of amputation stump ([place])   . Wound infection 05/09/2018  . AKI (acute kidney injury) ([place])   . S/P BKA (below knee amputation) unilateral, right ([place])   . Below-knee amputation with complication, initial encounter ([place])   . Tachypnea   . Post-operative pain   . Subacute osteomyelitis, right ankle and foot ([place])   . Paralysis of right lower extremity ([place])   . TIA (transient ischemic attack) 03/24/2018  . Encephalopathy 03/24/2018  . Cellulitis 03/15/2018  . Hypernatremia 03/15/2018  . Persistent atrial fibrillation ([place]) 11/10/2017  . Severe muscle deconditioning 11/10/2017  . Hemorrhage 10/22/2017  . CAD S/P percutaneous coronary angioplasty 10/22/2017  . Acute hypoxemic respiratory failure ([place]) 07/17/2017  . Aspiration syndrome, subsequent encounter 07/11/2017  . Aspiration pneumonia ([place]) 07/01/2017  . Acute encephalopathy 07/01/2017  . Thrombocytopenia ([place]) 07/01/2017  . Chronic diastolic (congestive) heart failure ([place]) 07/01/2017  . Anemia of chronic disease 07/01/2017  . Troponin level elevated 07/01/2017  . Chronic atrial fibrillation 07/01/2017  . Goals of care, counseling/discussion   . Palliative care by specialist   . Acute metabolic encephalopathy 31/49/7026  . CKD (chronic kidney disease) stage  3, GFR 30-59 ml/min   . CAP (community acquired pneumonia) 04/17/2017  . Hallux rigidus, right foot 03/10/2016  . Lumbosacral spondylosis without myelopathy 10/29/2015  . Memory difficulty 09/22/2015  . Abnormality of gait 09/22/2015  . Hyperglycemia 07/06/2015  . Chronic pain 07/06/2015  . Chronic insomnia 03/30/2015  . Transient alteration of awareness 03/30/2015  . Abnormal liver function   . Altered mental status 01/31/2015  . Essential hypertension 01/31/2015  . Constipation 01/31/2015  . Hypothyroidism 01/31/2015  . Seizure disorder ([place]) 01/31/2015  . Bladder outlet obstruction 01/31/2015  . GERD (gastroesophageal reflux disease)  01/31/2015  . Chronic back pain 01/31/2015  . Acute kidney injury ([place]) 01/31/2015    Jamey Reas PT, DPT 09/30/2019, 10:24 PM  [facility] [address] [place], [place], [address] Phone: [phone]   Fax:  [phone]  Name: [name] MRN: [id number] Date of Birth: 08-08-1933

## 2019-09-30 NOTE — Progress Notes (Signed)
   HPI: 86 y.o. male presenting today for complaint of constant soreness to the left third and fourth digits has been going on for the past year.  The patient is established and was last seen in the office on 01/28/2017.  He has a history of peripheral vascular disease and recurrent ulcers to the bilateral lower extremities.  Most recently he underwent below-knee amputation to the right lower extremity.  Currently there are no active wounds to his left lower extremity however he does complain of thickened dystrophic painful nails 1-5 the left foot.  He presents for further treatment and evaluation  Past Medical History:  Diagnosis Date  . Abnormality of gait 09/22/2015  . Arthritis   . Atrial fibrillation ([place])   . CAD (coronary artery disease)    Stent to RCA, Penta stent, 99% reduced to 0% 2002.  . Cancer ([place])    skin CA removed from back  . Chronic insomnia 03/30/2015  . Complication of anesthesia    trouble waking up  . GERD (gastroesophageal reflux disease)   . Hypercholesteremia   . Hypertension   . Hypothyroidism   . Memory difficulty 09/22/2015  . Osteoarthritis   . Pneumonia   . Seizures ([place])   . Sepsis ([place]) 05/2017  . Transient alteration of awareness 03/30/2015  . Vertigo    hx of     Physical Exam: General: The patient is alert and oriented x3 in no acute distress.  Dermatology: Skin is cool, dry and supple left lower extremity.  Negative for open lesions or macerations at the moment.  Vascular: Diminished pedal pulses left lower extremity. No edema or erythema noted. Capillary refill delayed left lower extremity.  Neurological: Epicritic and protective threshold diminished bilaterally.   Musculoskeletal Exam: Range of motion within normal limits to all pedal and ankle joints left. Muscle strength 5/5 in all groups left.  BKA noted right lower extremity  Radiographic Exam:  Normal osseous mineralization. Joint spaces preserved. No fracture/dislocation/boney  destruction.    Assessment: 1. H/o BKA RLE 2.  PVD LLE 3.  Dystrophic symptomatic nails 1-5 left 4. H/o multiple wounds bilateral lower extremities, recurrent   Plan of Care:  1. Patient evaluated. X-Rays reviewed.  2.  Mechanical debridement of nails 1-5 performed left foot without incident or bleeding 3.  Patient was not approved for diabetic shoes 4.  Recommend the shoe market on [address] to be fitted for shoes 5.  Prescription mupirocin ointment 6.  Return to clinic as needed     Edrick Kins, DPM [facility]  Dr. Edrick Kins, DPM    [address], [place] [address]                Office [phone]  Fax [phone]

## 2019-10-05 ENCOUNTER — Encounter: Payer: Medicare Other | Admitting: Physical Therapy

## 2019-10-08 ENCOUNTER — Encounter: Payer: Self-pay | Admitting: Physical Therapy

## 2019-10-08 ENCOUNTER — Ambulatory Visit (INDEPENDENT_AMBULATORY_CARE_PROVIDER_SITE_OTHER): Payer: Medicare Other | Admitting: Physical Therapy

## 2019-10-08 ENCOUNTER — Other Ambulatory Visit: Payer: Self-pay

## 2019-10-08 DIAGNOSIS — M25661 Stiffness of right knee, not elsewhere classified: Secondary | ICD-10-CM

## 2019-10-08 DIAGNOSIS — M6281 Muscle weakness (generalized): Secondary | ICD-10-CM

## 2019-10-08 DIAGNOSIS — R2681 Unsteadiness on feet: Secondary | ICD-10-CM

## 2019-10-08 DIAGNOSIS — R293 Abnormal posture: Secondary | ICD-10-CM | POA: Diagnosis not present

## 2019-10-08 DIAGNOSIS — R2689 Other abnormalities of gait and mobility: Secondary | ICD-10-CM | POA: Diagnosis not present

## 2019-10-08 DIAGNOSIS — Z9181 History of falling: Secondary | ICD-10-CM

## 2019-10-08 NOTE — Therapy (Signed)
[facility] Physical Therapy [address] [place], [place], [address] Phone: [phone]   Fax:  [phone]  Physical Therapy Treatment  Patient Details  Name: [name] MRN: [id number] Date of Birth: 04-18-1933 Referring Provider (PT): Bevely Palmer Persons, [place]   Encounter Date: 10/08/2019   PT End of Session - 10/08/19 1300    Visit Number 32    Number of Visits 11    Date for PT Re-Evaluation 11/04/19    Authorization Type BCBS & Medicare    PT Start Time 1300    PT Stop Time 1330    PT Time Calculation (min) 30 min    Equipment Utilized During Treatment Gait belt    Activity Tolerance Patient tolerated treatment well;Patient limited by fatigue    Behavior During Therapy [facility] for tasks assessed/performed           Past Medical History:  Diagnosis Date   Abnormality of gait 09/22/2015   Arthritis    Atrial fibrillation ([place])    CAD (coronary artery disease)    Stent to RCA, Penta stent, 99% reduced to 0% 2002.   Cancer ([place])    skin CA removed from back   Chronic insomnia 08/26/735   Complication of anesthesia    trouble waking up   GERD (gastroesophageal reflux disease)    Hypercholesteremia    Hypertension    Hypothyroidism    Memory difficulty 09/22/2015   Osteoarthritis    Pneumonia    Seizures ([place])    Sepsis ([place]) 05/2017   Transient alteration of awareness 03/30/2015   Vertigo    hx of    Past Surgical History:  Procedure Laterality Date   AMPUTATION Right 03/28/2018   Procedure: AMPUTATION BELOW KNEE;  Surgeon: Newt Minion, MD;  Location: [place];  Service: Orthopedics;  Laterality: Right;   APPLICATION OF WOUND VAC Right 01/23/2019   Procedure: Application Of  Prevena Wound Vac;  Surgeon: Newt Minion, MD;  Location: [place];  Service: Orthopedics;  Laterality: Right;   BACK SURGERY     EYE SURGERY     Bilateral Cataract surgery    HERNIA REPAIR     I & D EXTREMITY Right 05/10/2015   Procedure:  IRRIGATION AND DEBRIDEMENT EXTREMITY;  Surgeon: Leanora Cover, MD;  Location: [place];  Service: Orthopedics;  Laterality: Right;   KNEE ARTHROPLASTY     right knee X 2; left knee once   LAMINECTOMY     X 6   LEG AMPUTATION BELOW KNEE Right 03/28/2018   POSTERIOR CERVICAL FUSION/FORAMINOTOMY  01/28/2012   Procedure: POSTERIOR CERVICAL FUSION/FORAMINOTOMY LEVEL 3;  Surgeon: [name], MD;  Location: [place] [facility];  Service: Neurosurgery;  Laterality: Left;  Posterior Cervical Five-Thoracic One Fusion, Arthrodesis with LEFT Cervical Seven-thoracic One Laminectomy, Foraminotomy and Resection of Synovial Cyst   POSTERIOR CERVICAL FUSION/FORAMINOTOMY N/A 01/29/2013   Procedure: POSTERIOR CERVICAL FUSION/FORAMINOTOMY LEVEL 1 and C2-5 Posteriolateral Arthrodesis;  Surgeon: [name], MD;  Location: [place] [facility];  Service: Neurosurgery;  Laterality: N/A;  C2-C3 Laminectomy C2-C3 posterior cervical arthrodesis   STUMP REVISION Right 01/23/2019   Procedure: REVISION RIGHT BELOW KNEE AMPUTATION;  Surgeon: Newt Minion, MD;  Location: [place];  Service: Orthopedics;  Laterality: Right;   TONSILLECTOMY      There were no vitals filed for this visit.   Subjective Assessment - 10/08/19 1300    Subjective He has 2 wounds that formed on residal limb that he found over the  weekend. His wife is not certain if prosthesis was a factor. It could be that he bumped with transfer in/out bed.  He has wound on 2nd & 3rd distal toes that may be from sliding in shoe that is too big.  He has seen doctor and his wife is following recommendations for wound care.    Patient is accompained by: Family member   wife - [name]   Pertinent History Rt TTA, C2-5 Cervical Fusion 2014, Tachypnea, encephalopathy, RLE paralysis, TIA, A-Fib, CAD, CHF, CKD stage 3, Sz disorder, hx of vertigo, knee arthroplasty Rt X 2 & Lt X 1    Limitations Lifting;Standing;Walking;House hold activities    Patient  Stated Goals To get prosthesis, go to bathroom alone, get around house, get out in community.    Currently in Pain? Yes    Pain Score 4     Pain Location Toe (Comment which one)   2nd & 3rd   Pain Orientation Left    Pain Descriptors / Indicators Sore;Aching;Tender    Pain Type Acute pain    Pain Onset In the past 7 days    Pain Frequency Constant    Aggravating Factors  wounds distally    Pain Relieving Factors medication    Multiple Pain Sites Yes    Pain Score 6    Pain Location Other (Comment)   arthritic multiple joints - left shoulder & right hip worst   Pain Orientation Right;Left    Pain Descriptors / Indicators Aching;Sore    Pain Type Chronic pain   arthritic   Pain Onset More than a month ago    Pain Frequency Constant    Aggravating Factors  movements & stiffness from staying one position too long    Pain Relieving Factors shifting or changing positions, medications           Squat pivot transfer w/c to Nustep modA Nustep level 5 with BUEs & LLE 12 minutes.  See patient education.                       PT Education - 10/08/19 1330    Education Details Patient's shoe is 1-1.5 size too large resulting in slippage of foot & potential cause of wounds.  Pedors orthopedic shoes with stretch material would allow less compression on foot and proper size    Person(s) Educated Patient;Spouse    Methods Explanation;Other (comment);Demonstration   internet image of shoes   Comprehension Verbalized understanding            PT Short Term Goals - 10/08/19 1926      PT SHORT TERM GOAL #1   Title Patient & wife verbalize general understanding of adjusting ply socks with limb volume changes.    (All Target STGs: 10/08/2019)    Baseline NOT MET 10/08/2019    Time 1    Period Months    Status Not Met    Target Date 10/08/19      PT SHORT TERM GOAL #2   Title Sit to / from stand w/c to RW with wife's assistance and maintains upright for 2 minutes with RW  support with minA.    Baseline NOT MET 10/08/2019    Time 1    Period Months    Status Not Met    Target Date 10/08/19      PT SHORT TERM GOAL #3   Title Stand pivot transfer with RW with modA    Baseline NOT MET 10/08/2019  Time 1    Period Months    Status Not Met    Target Date 10/08/19      PT SHORT TERM GOAL #4   Title Patient tolerates prosthesis wear >12 hours total /day without wound issues.    Baseline NOT MET 10/08/2019    Time 1    Period Months    Status On-going    Target Date 10/08/19      PT SHORT TERM GOAL #5   Title Patient ambulates 13' with RW & prosthesis with modA.    Baseline NOT MET 10/08/2019    Time 1    Period Months    Status On-going    Target Date 10/08/19             PT Long Term Goals - 08/06/19 2236      PT LONG TERM GOAL #1   Title Patient and wife verbalize & demonstrate understanding of proper prosthetic care & patient able to donne prosthesis modified independent to enable safe utilization of prosthesis. (All LTGs Target Date: 11/04/2019)    Time 3    Period Months    Status On-going    Target Date 11/04/19      PT LONG TERM GOAL #2   Title Patient tolerates wear of prosthesis >90% of awake hours without skin or residual limb pain issues to enable function during his day.    Time 3    Period Months    Status On-going    Target Date 11/04/19      PT LONG TERM GOAL #3   Title Standing balance with RW support: maintains upright 2 minutes, reaches 5" anteriorly and scans environment with supervision.    Time 3    Period Months    Status On-going    Target Date 11/04/19      PT LONG TERM GOAL #4   Title Patient ambulates 100' with RW & prosthesis with wife's assistance / supervision and turns 180* to position to sit safely.    Time 3    Period Months    Status On-going    Target Date 11/04/19      PT LONG TERM GOAL #5   Title Sit to/from stand and stand-pivot transfers with RW & prosthesis with supervision.    Time 3     Period Months    Status On-going    Target Date 11/04/19                 Plan - 10/08/19 1930    Clinical Impression Statement Patient did not meet any of STGs due to wounds on limb preventing prosthesis wear.  He also has wounds on left toes probably from slippage with too large of shoe.  PT educated on stretch shoes of proper size.  PT recommending continueing PT to [place] on strength & endurance until can resume prosthetic training.    Personal Factors and Comorbidities Age;Comorbidity 3+;Fitness;Past/Current Experience;Time since onset of injury/illness/exacerbation    Comorbidities Rt TTA, C2-5 Cervical Fusion 2014, Tachypnea, encephalopathy, RLE paralysis, TIA, A-Fib, CAD, CHF, CKD stage 3, Sz disorder, hx of vertigo, knee arthroplasty Rt X 2 & Lt X 1    Examination-Activity Limitations Caring for Others;Locomotion Level;Reach Overhead;Squat;Stairs;Stand;Transfers    Examination-Participation Restrictions Community Activity    Stability/Clinical Decision Making Evolving/Moderate complexity    Rehab Potential Good    PT Frequency 2x / week    PT Duration Other (comment)   13 weeks (90 days)   PT Treatment/Interventions ADLs/Self  Care Home Management;DME Instruction;Gait training;Stair training;Functional mobility training;Therapeutic activities;Therapeutic exercise;Balance training;Neuromuscular re-education;Patient/family education;Prosthetic Training;Passive range of motion;Vestibular    PT Next Visit Plan Nustep & leg press exercise, sitting exercises without back support    Consulted and Agree with Plan of Care Patient;Family member/caregiver    Family Member Consulted wife, Linc Renne           Patient will benefit from skilled therapeutic intervention in order to improve the following deficits and impairments:  Abnormal gait, Cardiopulmonary status limiting activity, Decreased activity tolerance, Decreased balance, Decreased endurance, Decreased knowledge of use of DME,  Decreased mobility, Decreased range of motion, Decreased skin integrity, Decreased strength, Difficulty walking, Increased edema, Impaired flexibility, Postural dysfunction, Prosthetic Dependency, Pain  Visit Diagnosis: Unsteadiness on feet  Other abnormalities of gait and mobility  Abnormal posture  Stiffness of right knee, not elsewhere classified  Muscle weakness (generalized)  History of falling     Problem List Patient Active Problem List   Diagnosis Date Noted   Dehiscence of amputation stump ([place])    Wound infection 05/09/2018   AKI (acute kidney injury) ([place])    S/P BKA (below knee amputation) unilateral, right ([place])    Below-knee amputation with complication, initial encounter ([place])    Tachypnea    Post-operative pain    Subacute osteomyelitis, right ankle and foot ([place])    Paralysis of right lower extremity ([place])    TIA (transient ischemic attack) 03/24/2018   Encephalopathy 03/24/2018   Cellulitis 03/15/2018   Hypernatremia 03/15/2018   Persistent atrial fibrillation ([place]) 11/10/2017   Severe muscle deconditioning 11/10/2017   Hemorrhage 10/22/2017   CAD S/P percutaneous coronary angioplasty 10/22/2017   Acute hypoxemic respiratory failure ([place]) 07/17/2017   Aspiration syndrome, subsequent encounter 07/11/2017   Aspiration pneumonia ([place]) 07/01/2017   Acute encephalopathy 07/01/2017   Thrombocytopenia ([place]) 07/01/2017   Chronic diastolic (congestive) heart failure ([place]) 07/01/2017   Anemia of chronic disease 07/01/2017   Troponin level elevated 07/01/2017   Chronic atrial fibrillation 07/01/2017   Goals of care, counseling/discussion    Palliative care by specialist    Acute metabolic encephalopathy 77/41/2878   CKD (chronic kidney disease) stage 3, GFR 30-59 ml/min    CAP (community acquired pneumonia) 04/17/2017   Hallux rigidus, right foot 03/10/2016   Lumbosacral spondylosis without myelopathy 10/29/2015   Memory  difficulty 09/22/2015   Abnormality of gait 09/22/2015   Hyperglycemia 07/06/2015   Chronic pain 07/06/2015   Chronic insomnia 03/30/2015   Transient alteration of awareness 03/30/2015   Abnormal liver function    Altered mental status 01/31/2015   Essential hypertension 01/31/2015   Constipation 01/31/2015   Hypothyroidism 01/31/2015   Seizure disorder ([place]) 01/31/2015   Bladder outlet obstruction 01/31/2015   GERD (gastroesophageal reflux disease) 01/31/2015   Chronic back pain 01/31/2015   Acute kidney injury ([place]) 01/31/2015    Jamey Reas, PT, DPT 10/08/2019, 7:35 PM  [facility] Physical Therapy [address] [place], [place], [address] Phone: [phone]   Fax:  [phone]  Name: DVANTE HANDS MRN: [id number] Date of Birth: Oct 23, 1933

## 2019-10-12 ENCOUNTER — Ambulatory Visit (INDEPENDENT_AMBULATORY_CARE_PROVIDER_SITE_OTHER): Payer: Medicare Other | Admitting: Physical Therapy

## 2019-10-12 ENCOUNTER — Other Ambulatory Visit: Payer: Self-pay

## 2019-10-12 ENCOUNTER — Encounter: Payer: Self-pay | Admitting: Physical Therapy

## 2019-10-12 DIAGNOSIS — R2681 Unsteadiness on feet: Secondary | ICD-10-CM

## 2019-10-12 DIAGNOSIS — M25661 Stiffness of right knee, not elsewhere classified: Secondary | ICD-10-CM

## 2019-10-12 DIAGNOSIS — R2689 Other abnormalities of gait and mobility: Secondary | ICD-10-CM | POA: Diagnosis not present

## 2019-10-12 DIAGNOSIS — R293 Abnormal posture: Secondary | ICD-10-CM | POA: Diagnosis not present

## 2019-10-12 DIAGNOSIS — M6281 Muscle weakness (generalized): Secondary | ICD-10-CM

## 2019-10-12 DIAGNOSIS — M6249 Contracture of muscle, multiple sites: Secondary | ICD-10-CM

## 2019-10-12 DIAGNOSIS — Z9181 History of falling: Secondary | ICD-10-CM

## 2019-10-12 NOTE — Therapy (Signed)
[facility] Physical Therapy [address] [place], [place], [address] Phone: [phone]   Fax:  [phone]  Physical Therapy Treatment  Patient Details  Name: [name] MRN: [id number] Date of Birth: [birth year] Referring Provider (PT): Bevely Palmer Persons, [place]   Encounter Date: 10/12/2019   PT End of Session - 10/12/19 1507    Visit Number 33    Number of Visits 48    Date for PT Re-Evaluation 11/04/19    Authorization Type BCBS & Medicare    PT Start Time 1349    PT Stop Time 1430    PT Time Calculation (min) 41 min    Equipment Utilized During Treatment Gait belt    Activity Tolerance Patient tolerated treatment well;Patient limited by fatigue    Behavior During Therapy [facility] for tasks assessed/performed           Past Medical History:  Diagnosis Date  . Abnormality of gait 09/22/2015  . Arthritis   . Atrial fibrillation ([place])   . CAD (coronary artery disease)    Stent to RCA, Penta stent, 99% reduced to 0% 2002.  . Cancer ([place])    skin CA removed from back  . Chronic insomnia 03/30/2015  . Complication of anesthesia    trouble waking up  . GERD (gastroesophageal reflux disease)   . Hypercholesteremia   . Hypertension   . Hypothyroidism   . Memory difficulty 09/22/2015  . Osteoarthritis   . Pneumonia   . Seizures ([place])   . Sepsis ([place]) 05/2017  . Transient alteration of awareness 03/30/2015  . Vertigo    hx of    Past Surgical History:  Procedure Laterality Date  . AMPUTATION Right 03/28/2018   Procedure: AMPUTATION BELOW KNEE;  Surgeon: Newt Minion, MD;  Location: [place];  Service: Orthopedics;  Laterality: Right;  . APPLICATION OF WOUND VAC Right 01/23/2019   Procedure: Application Of  Prevena Wound Vac;  Surgeon: Newt Minion, MD;  Location: [place];  Service: Orthopedics;  Laterality: Right;  . BACK SURGERY    . EYE SURGERY     Bilateral Cataract surgery   . HERNIA REPAIR    . I & D EXTREMITY Right 05/10/2015   Procedure:  IRRIGATION AND DEBRIDEMENT EXTREMITY;  Surgeon: Leanora Cover, MD;  Location: [place];  Service: Orthopedics;  Laterality: Right;  . KNEE ARTHROPLASTY     right knee X 2; left knee once  . LAMINECTOMY     X 6  . LEG AMPUTATION BELOW KNEE Right 03/28/2018  . POSTERIOR CERVICAL FUSION/FORAMINOTOMY  01/28/2012   Procedure: POSTERIOR CERVICAL FUSION/FORAMINOTOMY LEVEL 3;  Surgeon: [name], MD;  Location: [place] [facility];  Service: Neurosurgery;  Laterality: Left;  Posterior Cervical Five-Thoracic One Fusion, Arthrodesis with LEFT Cervical Seven-thoracic One Laminectomy, Foraminotomy and Resection of Synovial Cyst  . POSTERIOR CERVICAL FUSION/FORAMINOTOMY N/A 01/29/2013   Procedure: POSTERIOR CERVICAL FUSION/FORAMINOTOMY LEVEL 1 and C2-5 Posteriolateral Arthrodesis;  Surgeon: [name], MD;  Location: [place] [facility];  Service: Neurosurgery;  Laterality: N/A;  C2-C3 Laminectomy C2-C3 posterior cervical arthrodesis  . STUMP REVISION Right 01/23/2019   Procedure: REVISION RIGHT BELOW KNEE AMPUTATION;  Surgeon: Newt Minion, MD;  Location: [place];  Service: Orthopedics;  Laterality: Right;  . TONSILLECTOMY      There were no vitals filed for this visit.      [place] Adult PT Treatment/Exercise - 10/12/19 0001      Transfers   Transfers Sit to Stand;Stand to  Sit;Lateral/Scoot Transfers;Squat Pivot Transfers    Sit to Stand 3: Mod assist;With upper extremity assist;With armrests;From chair/3-in-1;Other (comment)    Stand to Sit 4: Min assist;With upper extremity assist;With armrests;To chair/3-in-1;Other (comment)    Stand to Sit Details (indicate cue type and reason) Tactile cues for weight shifting;Visual cues for safe use of DME/AE;Verbal cues for technique;Verbal cues for sequencing;Verbal cues for safe use of DME/AE    Stand Pivot Transfer Details (indicate cue type and reason) transferred to and from [facility] to nu step, WC to leg press    Squat Pivot Transfers 3: Mod  assist;With upper extremity assistance      Exercises   Exercises Other Exercises    Other Exercises  seated push ups from arm rests on nu step seat X 10 reps, seated rows and extensions and chest press all with red band 2X10.       Knee/Hip Exercises: Aerobic   Nustep Level 6 with BUEs and Lt LE 10 minutes       Knee/Hip Exercises: Machines for Strengthening   Cybex Leg Press shuttle leg press 50# 15 reps with back 45* & 15 reps back flat.  ball between LEs & belt around knees to facilitate alignment.                     PT Short Term Goals - 10/08/19 1926      PT SHORT TERM GOAL #1   Title Patient & wife verbalize general understanding of adjusting ply socks with limb volume changes.    (All Target STGs: 10/08/2019)    Baseline NOT MET 10/08/2019    Time 1    Period Months    Status Not Met    Target Date 10/08/19      PT SHORT TERM GOAL #2   Title Sit to / from stand w/c to RW with wife's assistance and maintains upright for 2 minutes with RW support with minA.    Baseline NOT MET 10/08/2019    Time 1    Period Months    Status Not Met    Target Date 10/08/19      PT SHORT TERM GOAL #3   Title Stand pivot transfer with RW with modA    Baseline NOT MET 10/08/2019    Time 1    Period Months    Status Not Met    Target Date 10/08/19      PT SHORT TERM GOAL #4   Title Patient tolerates prosthesis wear >12 hours total /day without wound issues.    Baseline NOT MET 10/08/2019    Time 1    Period Months    Status On-going    Target Date 10/08/19      PT SHORT TERM GOAL #5   Title Patient ambulates 33' with RW & prosthesis with modA.    Baseline NOT MET 10/08/2019    Time 1    Period Months    Status On-going    Target Date 10/08/19             PT Long Term Goals - 08/06/19 2236      PT LONG TERM GOAL #1   Title Patient and wife verbalize & demonstrate understanding of proper prosthetic care & patient able to donne prosthesis modified independent to  enable safe utilization of prosthesis. (All LTGs Target Date: 11/04/2019)    Time 3    Period Months    Status On-going    Target Date 11/04/19  PT LONG TERM GOAL #2   Title Patient tolerates wear of prosthesis >90% of awake hours without skin or residual limb pain issues to enable function during his day.    Time 3    Period Months    Status On-going    Target Date 11/04/19      PT LONG TERM GOAL #3   Title Standing balance with RW support: maintains upright 2 minutes, reaches 5" anteriorly and scans environment with supervision.    Time 3    Period Months    Status On-going    Target Date 11/04/19      PT LONG TERM GOAL #4   Title Patient ambulates 100' with RW & prosthesis with wife's assistance / supervision and turns 180* to position to sit safely.    Time 3    Period Months    Status On-going    Target Date 11/04/19      PT LONG TERM GOAL #5   Title Sit to/from stand and stand-pivot transfers with RW & prosthesis with supervision.    Time 3    Period Months    Status On-going    Target Date 11/04/19                 Plan - 10/12/19 1510    Clinical Impression Statement He did not bring prosthesis as he still has wounds on inferior residual limb. Session focused on overall endurance, Lt leg strength, and transfers today with good tolerance. He will see PA tommorow who will evaluate wounds further.    Personal Factors and Comorbidities Age;Comorbidity 3+;Fitness;Past/Current Experience;Time since onset of injury/illness/exacerbation    Comorbidities Rt TTA, C2-5 Cervical Fusion 2014, Tachypnea, encephalopathy, RLE paralysis, TIA, A-Fib, CAD, CHF, CKD stage 3, Sz disorder, hx of vertigo, knee arthroplasty Rt X 2 & Lt X 1    Examination-Activity Limitations Caring for Others;Locomotion Level;Reach Overhead;Squat;Stairs;Stand;Transfers    Examination-Participation Restrictions Community Activity    Stability/Clinical Decision Making Evolving/Moderate complexity     Rehab Potential Good    PT Frequency 2x / week    PT Duration Other (comment)   13 weeks (90 days)   PT Treatment/Interventions ADLs/Self Care Home Management;DME Instruction;Gait training;Stair training;Functional mobility training;Therapeutic activities;Therapeutic exercise;Balance training;Neuromuscular re-education;Patient/family education;Prosthetic Training;Passive range of motion;Vestibular    PT Next Visit Plan Nustep & leg press exercise, sitting exercises without back support    Consulted and Agree with Plan of Care Patient;Family member/caregiver    Family Member Consulted wife, Jaimie Redditt           Patient will benefit from skilled therapeutic intervention in order to improve the following deficits and impairments:  Abnormal gait, Cardiopulmonary status limiting activity, Decreased activity tolerance, Decreased balance, Decreased endurance, Decreased knowledge of use of DME, Decreased mobility, Decreased range of motion, Decreased skin integrity, Decreased strength, Difficulty walking, Increased edema, Impaired flexibility, Postural dysfunction, Prosthetic Dependency, Pain  Visit Diagnosis: Unsteadiness on feet  Other abnormalities of gait and mobility  Abnormal posture  Stiffness of right knee, not elsewhere classified  Muscle weakness (generalized)  History of falling  Contracture of muscle, multiple sites     Problem List Patient Active Problem List   Diagnosis Date Noted  . Dehiscence of amputation stump ([place])   . Wound infection 05/09/2018  . AKI (acute kidney injury) ([place])   . S/P BKA (below knee amputation) unilateral, right ([place])   . Below-knee amputation with complication, initial encounter ([place])   . Tachypnea   . Post-operative pain   .  Subacute osteomyelitis, right ankle and foot ([place])   . Paralysis of right lower extremity ([place])   . TIA (transient ischemic attack) 03/24/2018  . Encephalopathy 03/24/2018  . Cellulitis 03/15/2018  .  Hypernatremia 03/15/2018  . Persistent atrial fibrillation ([place]) 11/10/2017  . Severe muscle deconditioning 11/10/2017  . Hemorrhage 10/22/2017  . CAD S/P percutaneous coronary angioplasty 10/22/2017  . Acute hypoxemic respiratory failure ([place]) 07/17/2017  . Aspiration syndrome, subsequent encounter 07/11/2017  . Aspiration pneumonia ([place]) 07/01/2017  . Acute encephalopathy 07/01/2017  . Thrombocytopenia ([place]) 07/01/2017  . Chronic diastolic (congestive) heart failure ([place]) 07/01/2017  . Anemia of chronic disease 07/01/2017  . Troponin level elevated 07/01/2017  . Chronic atrial fibrillation 07/01/2017  . Goals of care, counseling/discussion   . Palliative care by specialist   . Acute metabolic encephalopathy 58/11/9831  . CKD (chronic kidney disease) stage 3, GFR 30-59 ml/min   . CAP (community acquired pneumonia) 04/17/2017  . Hallux rigidus, right foot 03/10/2016  . Lumbosacral spondylosis without myelopathy 10/29/2015  . Memory difficulty 09/22/2015  . Abnormality of gait 09/22/2015  . Hyperglycemia 07/06/2015  . Chronic pain 07/06/2015  . Chronic insomnia 03/30/2015  . Transient alteration of awareness 03/30/2015  . Abnormal liver function   . Altered mental status 01/31/2015  . Essential hypertension 01/31/2015  . Constipation 01/31/2015  . Hypothyroidism 01/31/2015  . Seizure disorder ([place]) 01/31/2015  . Bladder outlet obstruction 01/31/2015  . GERD (gastroesophageal reflux disease) 01/31/2015  . Chronic back pain 01/31/2015  . Acute kidney injury [facility]) 01/31/2015    Silvestre Mesi 10/12/2019, 3:12 PM  [facility] Physical Therapy [address] [place], [place], [address] Phone: [phone]   Fax:  [phone]  Name: [name] MRN: [id number] Date of Birth: [birth year]

## 2019-10-13 ENCOUNTER — Telehealth: Payer: Self-pay | Admitting: Cardiovascular Disease

## 2019-10-13 ENCOUNTER — Ambulatory Visit (INDEPENDENT_AMBULATORY_CARE_PROVIDER_SITE_OTHER): Payer: Medicare Other | Admitting: Physician Assistant

## 2019-10-13 ENCOUNTER — Encounter: Payer: Self-pay | Admitting: Physician Assistant

## 2019-10-13 DIAGNOSIS — I5032 Chronic diastolic (congestive) heart failure: Secondary | ICD-10-CM

## 2019-10-13 DIAGNOSIS — Z89511 Acquired absence of right leg below knee: Secondary | ICD-10-CM

## 2019-10-13 DIAGNOSIS — I2510 Atherosclerotic heart disease of native coronary artery without angina pectoris: Secondary | ICD-10-CM

## 2019-10-13 DIAGNOSIS — D649 Anemia, unspecified: Secondary | ICD-10-CM

## 2019-10-13 DIAGNOSIS — R0600 Dyspnea, unspecified: Secondary | ICD-10-CM

## 2019-10-13 DIAGNOSIS — I4821 Permanent atrial fibrillation: Secondary | ICD-10-CM

## 2019-10-13 NOTE — Progress Notes (Signed)
Office Visit Note   Patient: [name]           Date of Birth: Nov 25, 1933           MRN: [id number] Visit Date: 10/13/2019              Requested by: [name], MD [place],  [place] [id number] PCP: [name], MD  No chief complaint on file.     HPI: This is a pleasant 86 year old gentleman who is approximately 9 months status post revision right below-knee amputation.  He has been doing well and working with physical therapy.  His wife was concerned because he has an abrasion at one end of the amputation stump.  He is not currently using his leg.  She has been doing a daily dressing change  Assessment & Plan: Visit Diagnoses: No diagnosis found.  Plan: Discussed with the patient's wife that she should use the shrinker against the skin every day and change it.  Findings most consistent with some skin pinching secondary to a liner.  They will follow-up in 2 weeks.  I do not see any evidence of abscess or infection  Follow-Up Instructions: No follow-ups on file.   Ortho Exam  Patient is alert, oriented, no adenopathy, well-dressed, normal affect, normal respiratory effort. Focused examination demonstrates well-healed amputation incision no swelling no cellulitis on the lateral side there is 2 small skin abrasions consistent with pinching of the liner are soft.  No cellulitis no swelling nontender no evidence of infection   Imaging: No results found. No images are attached to the encounter.  Labs: Lab Results  Component Value Date   HGBA1C 5.7 (H) 03/26/2018   HGBA1C 5.0 07/12/2017   HGBA1C 5.8 (H) 07/06/2015   ESRSEDRATE 24 (H) 03/24/2018   ESRSEDRATE 15 04/18/2017   ESRSEDRATE 5 09/17/2007   CRP 3.1 (H) 05/11/2018   CRP 1.8 (H) 03/24/2018   CRP <0.8 04/24/2017   LABURIC 6.0 03/09/2016   REPTSTATUS 07/12/2019 FINAL 07/10/2019   GRAMSTAIN  07/15/2017    WBC PRESENT,BOTH PMN AND MONONUCLEAR NO ORGANISMS SEEN Performed at [facility], [place] [address] [place], [place] [address]    CULT >=100,000 COLONIES/mL ESCHERICHIA COLI (A) 07/10/2019   LABORGA ESCHERICHIA COLI (A) 07/10/2019     Lab Results  Component Value Date   ALBUMIN 3.6 12/20/2018   ALBUMIN 3.0 (L) 05/09/2018   ALBUMIN 2.5 (L) 03/31/2018   PREALBUMIN 36 (H) 11/22/2017   PREALBUMIN 28.8 04/24/2017   LABURIC 6.0 03/09/2016    Lab Results  Component Value Date   MG 2.0 05/12/2018   MG 1.9 05/11/2018   MG 1.9 03/31/2018   No results found for: VD25OH  Lab Results  Component Value Date   PREALBUMIN 36 (H) 11/22/2017   PREALBUMIN 28.8 04/24/2017   CBC EXTENDED Latest Ref Rng & Units 07/10/2019 01/23/2019 12/20/2018  WBC 4.0 - 10.5 K/uL 8.5 7.3 11.3(H)  RBC 4.22 - 5.81 MIL/uL 3.51(L) 3.80(L) 3.80(L)  HGB 13.0 - 17.0 g/dL 10.9(L) 12.4(L) 12.5(L)  HCT 39 - 52 % 35.5(L) 41.2 40.0  PLT 150 - 400 K/uL 117(L) 135(L) 132(L)  NEUTROABS 1.7 - 7.7 K/uL 6.2 - 9.6(H)  LYMPHSABS 0.7 - 4.0 K/uL 1.1 - 0.6(L)     There is no height or weight on file to calculate BMI.  Orders:  No orders of the defined types were placed in this encounter.  No orders of the defined types were placed in this encounter.  Procedures: No procedures performed  Clinical Data: No additional findings.  ROS:  All other systems negative, except as noted in the HPI. Review of Systems  Objective: Vital Signs: There were no vitals taken for this visit.  Specialty Comments:  No specialty comments available.  PMFS History: Patient Active Problem List   Diagnosis Date Noted  . Dehiscence of amputation stump ([place])   . Wound infection 05/09/2018  . AKI (acute kidney injury) ([place])   . S/P BKA (below knee amputation) unilateral, right ([place])   . Below-knee amputation with complication, initial encounter ([place])   . Tachypnea   . Post-operative pain   . Subacute osteomyelitis, right ankle and foot ([place])   . 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)   . TIA  (transient ischemic attack) 03/24/2018  . Encephalopathy 03/24/2018  . Cellulitis 03/15/2018  . Hypernatremia 03/15/2018  . Persistent atrial fibrillation ([place]) 11/10/2017  . Severe muscle deconditioning 11/10/2017  . Hemorrhage 10/22/2017  . CAD S/P percutaneous coronary angioplasty 10/22/2017  . Acute hypoxemic respiratory failure ([place]) 07/17/2017  . Aspiration syndrome, subsequent encounter 07/11/2017  . Aspiration pneumonia ([place]) 07/01/2017  . Acute encephalopathy 07/01/2017  . Thrombocytopenia ([place]) 07/01/2017  . Chronic diastolic (congestive) heart failure ([place]) 07/01/2017  . Anemia of chronic disease 07/01/2017  . Troponin level elevated 07/01/2017  . Chronic atrial fibrillation 07/01/2017  . Goals of care, counseling/discussion   . Palliative care by specialist   . Acute metabolic encephalopathy 48/54/6270  . CKD (chronic kidney disease) stage 3, GFR 30-59 ml/min   . CAP (community acquired pneumonia) 04/17/2017  . Hallux rigidus, right foot 03/10/2016  . Lumbosacral spondylosis without myelopathy 10/29/2015  . Memory difficulty 09/22/2015  . Abnormality of gait 09/22/2015  . Hyperglycemia 07/06/2015  . Chronic pain 07/06/2015  . Chronic insomnia 03/30/2015  . Transient alteration of awareness 03/30/2015  . Abnormal liver function   . Altered mental status 01/31/2015  . Essential hypertension 01/31/2015  . Constipation 01/31/2015  . Hypothyroidism 01/31/2015  . Seizure disorder ([place]) 01/31/2015  . Bladder outlet obstruction 01/31/2015  . GERD (gastroesophageal reflux disease) 01/31/2015  . Chronic back pain 01/31/2015  . Acute kidney injury ([place]) 01/31/2015   Past Medical History:  Diagnosis Date  . Abnormality of gait 09/22/2015  . Arthritis   . Atrial fibrillation ([place])   . CAD (coronary artery disease)    Stent to RCA, Penta stent, 99% reduced to 0% 2002.  . Cancer ([place])    skin CA removed from back  . Chronic insomnia 03/30/2015  . Complication of  anesthesia    trouble waking up  . GERD (gastroesophageal reflux disease)   . Hypercholesteremia   . Hypertension   . Hypothyroidism   . Memory difficulty 09/22/2015  . Osteoarthritis   . Pneumonia   . Seizures ([place])   . Sepsis ([place]) 05/2017  . Transient alteration of awareness 03/30/2015  . Vertigo    hx of    Family History  Problem Relation Age of Onset  . Hypertension Mother   . Cancer Mother   . Kidney failure Father   . Heart disease Father     Past Surgical History:  Procedure Laterality Date  . AMPUTATION Right 03/28/2018   Procedure: AMPUTATION BELOW KNEE;  Surgeon: Newt Minion, MD;  Location: [facility];  Service: Orthopedics;  Laterality: Right;  . APPLICATION OF WOUND VAC Right 01/23/2019   Procedure: Application Of  Prevena Wound Vac;  Surgeon: Newt Minion, MD;  Location: [place] [facility];  Service: Orthopedics;  Laterality: Right;  . BACK SURGERY    . EYE SURGERY     Bilateral Cataract surgery   . HERNIA REPAIR    . I & D EXTREMITY Right 05/10/2015   Procedure: IRRIGATION AND DEBRIDEMENT EXTREMITY;  Surgeon: Leanora Cover, MD;  Location: [facility];  Service: Orthopedics;  Laterality: Right;  . KNEE ARTHROPLASTY     right knee X 2; left knee once  . LAMINECTOMY     X 6  . LEG AMPUTATION BELOW KNEE Right 03/28/2018  . POSTERIOR CERVICAL FUSION/FORAMINOTOMY  01/28/2012   Procedure: POSTERIOR CERVICAL FUSION/FORAMINOTOMY LEVEL 3;  Surgeon: [name], MD;  Location: [facility];  Service: Neurosurgery;  Laterality: Left;  Posterior Cervical Five-Thoracic One Fusion, Arthrodesis with LEFT Cervical Seven-thoracic One Laminectomy, Foraminotomy and Resection of Synovial Cyst  . POSTERIOR CERVICAL FUSION/FORAMINOTOMY N/A 01/29/2013   Procedure: POSTERIOR CERVICAL FUSION/FORAMINOTOMY LEVEL 1 and C2-5 Posteriolateral Arthrodesis;  Surgeon: [name], MD;  Location: [place] [facility];  Service: Neurosurgery;  Laterality: N/A;  C2-C3 Laminectomy C2-C3 posterior  cervical arthrodesis  . STUMP REVISION Right 01/23/2019   Procedure: REVISION RIGHT BELOW KNEE AMPUTATION;  Surgeon: Newt Minion, MD;  Location: [facility];  Service: Orthopedics;  Laterality: Right;  . TONSILLECTOMY     Social History   Occupational History  . Occupation: retired Software engineer  Tobacco Use  . Smoking status: Former Research scientist (life sciences)  . Smokeless tobacco: Never Used  Vaping Use  . Vaping Use: Never used  Substance and Sexual Activity  . Alcohol use: Not Currently    Comment: rare  . Drug use: No  . Sexual activity: Not Currently

## 2019-10-13 NOTE — Telephone Encounter (Signed)
Yes, please. Lipid profile, CMET, BNP for dyspnea and CBC for anemia

## 2019-10-13 NOTE — Telephone Encounter (Signed)
Patient's wife is calling to discuss whether or not patient needs to have lab work prior to appointment schedule for 10/22/19 witrh Dr. Sallyanne Kuster. Please advise.

## 2019-10-13 NOTE — Telephone Encounter (Signed)
Called and spoke with pt's wife per DPR. Notified that Dr.C did want some labs drawn and that they could come to the office Friday prior to his follow up visit or on Monday prior to his visit at the latest. Advised that these are fasting labs so no food or drink after midnight. Wife verbalized understanding with no other questions at this time.  Will place orders and mail lab slips

## 2019-10-15 ENCOUNTER — Other Ambulatory Visit: Payer: Self-pay

## 2019-10-15 ENCOUNTER — Ambulatory Visit (INDEPENDENT_AMBULATORY_CARE_PROVIDER_SITE_OTHER): Payer: Medicare Other | Admitting: Physical Therapy

## 2019-10-15 VITALS — HR 88

## 2019-10-15 DIAGNOSIS — R2681 Unsteadiness on feet: Secondary | ICD-10-CM

## 2019-10-15 DIAGNOSIS — M25661 Stiffness of right knee, not elsewhere classified: Secondary | ICD-10-CM | POA: Diagnosis not present

## 2019-10-15 DIAGNOSIS — R2689 Other abnormalities of gait and mobility: Secondary | ICD-10-CM

## 2019-10-15 DIAGNOSIS — M542 Cervicalgia: Secondary | ICD-10-CM

## 2019-10-15 DIAGNOSIS — R293 Abnormal posture: Secondary | ICD-10-CM

## 2019-10-15 DIAGNOSIS — M6249 Contracture of muscle, multiple sites: Secondary | ICD-10-CM

## 2019-10-15 DIAGNOSIS — M6281 Muscle weakness (generalized): Secondary | ICD-10-CM

## 2019-10-15 DIAGNOSIS — R531 Weakness: Secondary | ICD-10-CM

## 2019-10-15 DIAGNOSIS — Z9181 History of falling: Secondary | ICD-10-CM

## 2019-10-15 NOTE — Therapy (Signed)
[facility] Physical Therapy [address] [place], [place], [address] Phone: [phone]   Fax:  [phone]  Physical Therapy Treatment  Patient Details  Name: [name] MRN: [id number] Date of Birth: [birth year] Referring Provider (PT): Bevely Palmer Persons, [place]   Encounter Date: 10/15/2019   PT End of Session - 10/15/19 1441    Visit Number 34    Number of Visits 48    Date for PT Re-Evaluation 11/04/19    Authorization Type BCBS & Medicare    PT Start Time 1350    PT Stop Time 1430    PT Time Calculation (min) 40 min    Equipment Utilized During Treatment Gait belt    Activity Tolerance Patient tolerated treatment well;Patient limited by fatigue    Behavior During Therapy [facility] for tasks assessed/performed           Past Medical History:  Diagnosis Date  . Abnormality of gait 09/22/2015  . Arthritis   . Atrial fibrillation ([place])   . CAD (coronary artery disease)    Stent to RCA, Penta stent, 99% reduced to 0% 2002.  . Cancer ([place])    skin CA removed from back  . Chronic insomnia 03/30/2015  . Complication of anesthesia    trouble waking up  . GERD (gastroesophageal reflux disease)   . Hypercholesteremia   . Hypertension   . Hypothyroidism   . Memory difficulty 09/22/2015  . Osteoarthritis   . Pneumonia   . Seizures ([place])   . Sepsis ([place]) 05/2017  . Transient alteration of awareness 03/30/2015  . Vertigo    hx of    Past Surgical History:  Procedure Laterality Date  . AMPUTATION Right 03/28/2018   Procedure: AMPUTATION BELOW KNEE;  Surgeon: Newt Minion, MD;  Location: [place];  Service: Orthopedics;  Laterality: Right;  . APPLICATION OF WOUND VAC Right 01/23/2019   Procedure: Application Of  Prevena Wound Vac;  Surgeon: Newt Minion, MD;  Location: [place];  Service: Orthopedics;  Laterality: Right;  . BACK SURGERY    . EYE SURGERY     Bilateral Cataract surgery   . HERNIA REPAIR    . I & D EXTREMITY Right 05/10/2015   Procedure:  IRRIGATION AND DEBRIDEMENT EXTREMITY;  Surgeon: Leanora Cover, MD;  Location: [place];  Service: Orthopedics;  Laterality: Right;  . KNEE ARTHROPLASTY     right knee X 2; left knee once  . LAMINECTOMY     X 6  . LEG AMPUTATION BELOW KNEE Right 03/28/2018  . POSTERIOR CERVICAL FUSION/FORAMINOTOMY  01/28/2012   Procedure: POSTERIOR CERVICAL FUSION/FORAMINOTOMY LEVEL 3;  Surgeon: [name], MD;  Location: [place] [facility];  Service: Neurosurgery;  Laterality: Left;  Posterior Cervical Five-Thoracic One Fusion, Arthrodesis with LEFT Cervical Seven-thoracic One Laminectomy, Foraminotomy and Resection of Synovial Cyst  . POSTERIOR CERVICAL FUSION/FORAMINOTOMY N/A 01/29/2013   Procedure: POSTERIOR CERVICAL FUSION/FORAMINOTOMY LEVEL 1 and C2-5 Posteriolateral Arthrodesis;  Surgeon: [name], MD;  Location: [place] [facility];  Service: Neurosurgery;  Laterality: N/A;  C2-C3 Laminectomy C2-C3 posterior cervical arthrodesis  . STUMP REVISION Right 01/23/2019   Procedure: REVISION RIGHT BELOW KNEE AMPUTATION;  Surgeon: Newt Minion, MD;  Location: [facility];  Service: Orthopedics;  Laterality: Right;  . TONSILLECTOMY      Vitals:   10/15/19 1436  Pulse: 88  SpO2: 100%     Subjective Assessment - 10/15/19 1412    Subjective His wife relays they saw PA reguarding the wounds  on distal limb and that PA instruced them to leave wound uncovered and keep duda/wound sock on.    Patient is accompained by: Family member   wife - [name]   Pertinent History Rt TTA, C2-5 Cervical Fusion 2014, Tachypnea, encephalopathy, RLE paralysis, TIA, A-Fib, CAD, CHF, CKD stage 3, Sz disorder, hx of vertigo, knee arthroplasty Rt X 2 & Lt X 1    Limitations Lifting;Standing;Walking;House hold activities    Patient Stated Goals To get prosthesis, go to bathroom alone, get around house, get out in community.    Currently in Pain? No/denies    Pain Onset In the past 7 days    Pain Onset More than a month  ago            [facility] Adult PT Treatment/Exercise - 10/15/19 0001      Transfers   Transfers Sit to Stand;Stand to Sit;Lateral/Scoot Transfers;Squat Pivot Transfers    Sit to Stand 3: Mod assist;With upper extremity assist;With armrests;From chair/3-in-1;Other (comment)    Stand to Sit 4: Min assist;With upper extremity assist;With armrests;To chair/3-in-1;Other (comment)    Stand to Sit Details (indicate cue type and reason) Tactile cues for weight shifting;Visual cues for safe use of DME/AE;Verbal cues for technique;Verbal cues for sequencing;Verbal cues for safe use of DME/AE    Squat Pivot Transfers 2: Max assist;3: Mod assist    Squat Pivot Transfer Details (indicate cue type and reason) from [facility] to nustep max A, from nu step to [facility] mod A      Exercises   Other Exercises  seated push ups from arm rests on nu step seat X 10 reps, seated rows and extensions and chest press all with green band 2X10.       Knee/Hip Exercises: Aerobic   Nustep Level 6 with BUEs and Lt LE 10 minutes       Knee/Hip Exercises: Machines for Strengthening   Cybex Leg Press shuttle leg press 50# 3X10 reps      Knee/Hip Exercises: Seated   Long Arc Quad Right;2 sets;15 reps    Long Arc Quad Limitations Left 5 lbs X 5 reps, then dropped to 3 lbs for 10 reps    Ball Squeeze 5 sec X 15 reps    Clamshell with Marga Hoots   3X10 reps                   PT Short Term Goals - 10/08/19 1926      PT SHORT TERM GOAL #1   Title Patient & wife verbalize general understanding of adjusting ply socks with limb volume changes.    (All Target STGs: 10/08/2019)    Baseline NOT MET 10/08/2019    Time 1    Period Months    Status Not Met    Target Date 10/08/19      PT SHORT TERM GOAL #2   Title Sit to / from stand w/c to RW with wife's assistance and maintains upright for 2 minutes with RW support with minA.    Baseline NOT MET 10/08/2019    Time 1    Period Months    Status Not Met    Target Date  10/08/19      PT SHORT TERM GOAL #3   Title Stand pivot transfer with RW with modA    Baseline NOT MET 10/08/2019    Time 1    Period Months    Status Not Met    Target Date 10/08/19  PT SHORT TERM GOAL #4   Title Patient tolerates prosthesis wear >12 hours total /day without wound issues.    Baseline NOT MET 10/08/2019    Time 1    Period Months    Status On-going    Target Date 10/08/19      PT SHORT TERM GOAL #5   Title Patient ambulates 49' with RW & prosthesis with modA.    Baseline NOT MET 10/08/2019    Time 1    Period Months    Status On-going    Target Date 10/08/19             PT Long Term Goals - 08/06/19 2236      PT LONG TERM GOAL #1   Title Patient and wife verbalize & demonstrate understanding of proper prosthetic care & patient able to donne prosthesis modified independent to enable safe utilization of prosthesis. (All LTGs Target Date: 11/04/2019)    Time 3    Period Months    Status On-going    Target Date 11/04/19      PT LONG TERM GOAL #2   Title Patient tolerates wear of prosthesis >90% of awake hours without skin or residual limb pain issues to enable function during his day.    Time 3    Period Months    Status On-going    Target Date 11/04/19      PT LONG TERM GOAL #3   Title Standing balance with RW support: maintains upright 2 minutes, reaches 5" anteriorly and scans environment with supervision.    Time 3    Period Months    Status On-going    Target Date 11/04/19      PT LONG TERM GOAL #4   Title Patient ambulates 100' with RW & prosthesis with wife's assistance / supervision and turns 180* to position to sit safely.    Time 3    Period Months    Status On-going    Target Date 11/04/19      PT LONG TERM GOAL #5   Title Sit to/from stand and stand-pivot transfers with RW & prosthesis with supervision.    Time 3    Period Months    Status On-going    Target Date 11/04/19                 Plan - 10/15/19 1442     Clinical Impression Statement Again did not bring prosthesis while wounds are healing on residual limb. Wound was checked and looks healing with healthy red tissue. Performed functional transfers, endurance training and seated UE/LE exericses as tolerated.    Personal Factors and Comorbidities Age;Comorbidity 3+;Fitness;Past/Current Experience;Time since onset of injury/illness/exacerbation    Comorbidities Rt TTA, C2-5 Cervical Fusion 2014, Tachypnea, encephalopathy, RLE paralysis, TIA, A-Fib, CAD, CHF, CKD stage 3, Sz disorder, hx of vertigo, knee arthroplasty Rt X 2 & Lt X 1    Examination-Activity Limitations Caring for Others;Locomotion Level;Reach Overhead;Squat;Stairs;Stand;Transfers    Examination-Participation Restrictions Community Activity    Stability/Clinical Decision Making Evolving/Moderate complexity    Rehab Potential Good    PT Frequency 2x / week    PT Duration Other (comment)   13 weeks (90 days)   PT Treatment/Interventions ADLs/Self Care Home Management;DME Instruction;Gait training;Stair training;Functional mobility training;Therapeutic activities;Therapeutic exercise;Balance training;Neuromuscular re-education;Patient/family education;Prosthetic Training;Passive range of motion;Vestibular    PT Next Visit Plan how does wound look and does this need tegaderm? Nustep & leg press exercise, sitting exercises without back support    Consulted  and Agree with Plan of Care Patient;Family member/caregiver    Family Member Consulted wife, [name]           Patient will benefit from skilled therapeutic intervention in order to improve the following deficits and impairments:  Abnormal gait, Cardiopulmonary status limiting activity, Decreased activity tolerance, Decreased balance, Decreased endurance, Decreased knowledge of use of DME, Decreased mobility, Decreased range of motion, Decreased skin integrity, Decreased strength, Difficulty walking, Increased edema, Impaired  flexibility, Postural dysfunction, Prosthetic Dependency, Pain  Visit Diagnosis: Unsteadiness on feet  Other abnormalities of gait and mobility  Abnormal posture  Stiffness of right knee, not elsewhere classified  Muscle weakness (generalized)  History of falling  Contracture of muscle, multiple sites  Cervicalgia  Weakness generalized     Problem List Patient Active Problem List   Diagnosis Date Noted  . Dehiscence of amputation stump ([place])   . Wound infection 05/09/2018  . AKI (acute kidney injury) ([place])   . S/P BKA (below knee amputation) unilateral, right ([place])   . Below-knee amputation with complication, initial encounter ([place])   . Tachypnea   . Post-operative pain   . Subacute osteomyelitis, right ankle and foot ([place])   . Paralysis of right lower extremity ([place])   . TIA (transient ischemic attack) 03/24/2018  . Encephalopathy 03/24/2018  . Cellulitis 03/15/2018  . Hypernatremia 03/15/2018  . Persistent atrial fibrillation ([place]) 11/10/2017  . Severe muscle deconditioning 11/10/2017  . Hemorrhage 10/22/2017  . CAD S/P percutaneous coronary angioplasty 10/22/2017  . Acute hypoxemic respiratory failure ([place]) 07/17/2017  . Aspiration syndrome, subsequent encounter 07/11/2017  . Aspiration pneumonia ([place]) 07/01/2017  . Acute encephalopathy 07/01/2017  . Thrombocytopenia ([place]) 07/01/2017  . Chronic diastolic (congestive) heart failure ([place]) 07/01/2017  . Anemia of chronic disease 07/01/2017  . Troponin level elevated 07/01/2017  . Chronic atrial fibrillation 07/01/2017  . Goals of care, counseling/discussion   . Palliative care by specialist   . Acute metabolic encephalopathy 72/90/2111  . CKD (chronic kidney disease) stage 3, GFR 30-59 ml/min   . CAP (community acquired pneumonia) 04/17/2017  . Hallux rigidus, right foot 03/10/2016  . Lumbosacral spondylosis without myelopathy 10/29/2015  . Memory difficulty 09/22/2015  . Abnormality of gait 09/22/2015  .  Hyperglycemia 07/06/2015  . Chronic pain 07/06/2015  . Chronic insomnia 03/30/2015  . Transient alteration of awareness 03/30/2015  . Abnormal liver function   . Altered mental status 01/31/2015  . Essential hypertension 01/31/2015  . Constipation 01/31/2015  . Hypothyroidism 01/31/2015  . Seizure disorder ([place]) 01/31/2015  . Bladder outlet obstruction 01/31/2015  . GERD (gastroesophageal reflux disease) 01/31/2015  . Chronic back pain 01/31/2015  . Acute kidney injury [facility]) 01/31/2015    Silvestre Mesi 10/15/2019, 2:47 PM  [facility] Physical Therapy [address] [place], [place], [address] Phone: [phone]   Fax:  [phone]  Name: [name] MRN: [id number] Date of Birth: [birth year]

## 2019-10-17 LAB — CBC
Hematocrit: 33 % — ABNORMAL LOW (ref 37.5–51.0)
Hemoglobin: 11 g/dL — ABNORMAL LOW (ref 13.0–17.7)
MCH: 33.6 pg — ABNORMAL HIGH (ref 26.6–33.0)
MCHC: 33.3 g/dL (ref 31.5–35.7)
MCV: 101 fL — ABNORMAL HIGH (ref 79–97)
Platelets: 95 10*3/uL — CL (ref 150–450)
RBC: 3.27 x10E6/uL — ABNORMAL LOW (ref 4.14–5.80)
RDW: 14.8 % (ref 11.6–15.4)
WBC: 6.3 10*3/uL (ref 3.4–10.8)

## 2019-10-17 LAB — BRAIN NATRIURETIC PEPTIDE: BNP: 628.2 pg/mL — ABNORMAL HIGH (ref 0.0–100.0)

## 2019-10-17 LAB — COMPREHENSIVE METABOLIC PANEL
ALT: 18 IU/L (ref 0–44)
AST: 20 IU/L (ref 0–40)
Albumin/Globulin Ratio: 2.8 — ABNORMAL HIGH (ref 1.2–2.2)
Albumin: 3.6 g/dL (ref 3.6–4.6)
Alkaline Phosphatase: 60 IU/L (ref 48–121)
BUN/Creatinine Ratio: 25 — ABNORMAL HIGH (ref 10–24)
BUN: 33 mg/dL — ABNORMAL HIGH (ref 8–27)
Bilirubin Total: 0.7 mg/dL (ref 0.0–1.2)
CO2: 25 mmol/L (ref 20–29)
Calcium: 8.2 mg/dL — ABNORMAL LOW (ref 8.6–10.2)
Chloride: 104 mmol/L (ref 96–106)
Creatinine, Ser: 1.33 mg/dL — ABNORMAL HIGH (ref 0.76–1.27)
GFR calc Af Amer: 57 mL/min/{1.73_m2} — ABNORMAL LOW (ref >59)
GFR calc non Af Amer: 49 mL/min/{1.73_m2} — ABNORMAL LOW (ref >59)
Globulin, Total: 1.3 g/dL — ABNORMAL LOW (ref 1.5–4.5)
Glucose: 75 mg/dL (ref 65–99)
Potassium: 4.3 mmol/L (ref 3.5–5.2)
Sodium: 141 mmol/L (ref 134–144)
Total Protein: 4.9 g/dL — ABNORMAL LOW (ref 6.0–8.5)

## 2019-10-17 LAB — LIPID PANEL
Chol/HDL Ratio: 2.1 ratio (ref 0.0–5.0)
Cholesterol, Total: 121 mg/dL (ref 100–199)
HDL: 58 mg/dL (ref >39)
LDL Chol Calc (NIH): 44 mg/dL (ref 0–99)
Triglycerides: 105 mg/dL (ref 0–149)
VLDL Cholesterol Cal: 19 mg/dL (ref 5–40)

## 2019-10-19 ENCOUNTER — Other Ambulatory Visit: Payer: Self-pay

## 2019-10-19 ENCOUNTER — Encounter: Payer: Self-pay | Admitting: Physical Therapy

## 2019-10-19 ENCOUNTER — Ambulatory Visit (INDEPENDENT_AMBULATORY_CARE_PROVIDER_SITE_OTHER): Payer: Medicare Other | Admitting: Physical Therapy

## 2019-10-19 DIAGNOSIS — M25661 Stiffness of right knee, not elsewhere classified: Secondary | ICD-10-CM

## 2019-10-19 DIAGNOSIS — R2689 Other abnormalities of gait and mobility: Secondary | ICD-10-CM | POA: Diagnosis not present

## 2019-10-19 DIAGNOSIS — R2681 Unsteadiness on feet: Secondary | ICD-10-CM

## 2019-10-19 DIAGNOSIS — M6281 Muscle weakness (generalized): Secondary | ICD-10-CM

## 2019-10-19 DIAGNOSIS — R293 Abnormal posture: Secondary | ICD-10-CM

## 2019-10-19 DIAGNOSIS — Z9181 History of falling: Secondary | ICD-10-CM

## 2019-10-19 DIAGNOSIS — M6249 Contracture of muscle, multiple sites: Secondary | ICD-10-CM

## 2019-10-19 NOTE — Therapy (Signed)
[facility] [address] [place], [place], [address] Phone: [phone]   Fax:  [phone]  Physical Therapy Treatment  Patient Details  Name: [name] MRN: [id number] Date of Birth: 1933-03-22 Referring Provider (PT): Bevely Palmer Persons, [place]   Encounter Date: 10/19/2019   PT End of Session - 10/19/19 1410    Visit Number 35    Number of Visits 48    Date for PT Re-Evaluation 11/04/19    Authorization Type BCBS & Medicare    PT Start Time 1355    PT Stop Time 1438    PT Time Calculation (min) 43 min    Equipment Utilized During Treatment Gait belt    Activity Tolerance Patient tolerated treatment well;Patient limited by fatigue    Behavior During Therapy [facility] for tasks assessed/performed           Past Medical History:  Diagnosis Date  . Abnormality of gait 09/22/2015  . Arthritis   . Atrial fibrillation ([place])   . CAD (coronary artery disease)    Stent to RCA, Penta stent, 99% reduced to 0% 2002.  . Cancer ([place])    skin CA removed from back  . Chronic insomnia 03/30/2015  . Complication of anesthesia    trouble waking up  . GERD (gastroesophageal reflux disease)   . Hypercholesteremia   . Hypertension   . Hypothyroidism   . Memory difficulty 09/22/2015  . Osteoarthritis   . Pneumonia   . Seizures ([place])   . Sepsis ([place]) 05/2017  . Transient alteration of awareness 03/30/2015  . Vertigo    hx of    Past Surgical History:  Procedure Laterality Date  . AMPUTATION Right 03/28/2018   Procedure: AMPUTATION BELOW KNEE;  Surgeon: Newt Minion, MD;  Location: [place];  Service: Orthopedics;  Laterality: Right;  . APPLICATION OF WOUND VAC Right 01/23/2019   Procedure: Application Of  Prevena Wound Vac;  Surgeon: Newt Minion, MD;  Location: [place];  Service: Orthopedics;  Laterality: Right;  . BACK SURGERY    . EYE SURGERY     Bilateral Cataract surgery   . HERNIA REPAIR    . I & D EXTREMITY Right 05/10/2015   Procedure:  IRRIGATION AND DEBRIDEMENT EXTREMITY;  Surgeon: Leanora Cover, MD;  Location: [place];  Service: Orthopedics;  Laterality: Right;  . KNEE ARTHROPLASTY     right knee X 2; left knee once  . LAMINECTOMY     X 6  . LEG AMPUTATION BELOW KNEE Right 03/28/2018  . POSTERIOR CERVICAL FUSION/FORAMINOTOMY  01/28/2012   Procedure: POSTERIOR CERVICAL FUSION/FORAMINOTOMY LEVEL 3;  Surgeon: [name], MD;  Location: [place] [facility];  Service: Neurosurgery;  Laterality: Left;  Posterior Cervical Five-Thoracic One Fusion, Arthrodesis with LEFT Cervical Seven-thoracic One Laminectomy, Foraminotomy and Resection of Synovial Cyst  . POSTERIOR CERVICAL FUSION/FORAMINOTOMY N/A 01/29/2013   Procedure: POSTERIOR CERVICAL FUSION/FORAMINOTOMY LEVEL 1 and C2-5 Posteriolateral Arthrodesis;  Surgeon: [name], MD;  Location: [place] [facility];  Service: Neurosurgery;  Laterality: N/A;  C2-C3 Laminectomy C2-C3 posterior cervical arthrodesis  . STUMP REVISION Right 01/23/2019   Procedure: REVISION RIGHT BELOW KNEE AMPUTATION;  Surgeon: Newt Minion, MD;  Location: [place];  Service: Orthopedics;  Laterality: Right;  . TONSILLECTOMY      There were no vitals filed for this visit.   Subjective Assessment - 10/19/19 1355    Subjective His wife has been cleaning wound on residual limb multiple times per day with  antibacterial soap. It appears to be healing. She has not been able to find [employer]ors stretch shoes in stores yet.    Patient is accompained by: Family member   wife - [name]   Pertinent History Rt TTA, C2-5 Cervical Fusion 2014, Tachypnea, encephalopathy, RLE paralysis, TIA, A-Fib, CAD, CHF, CKD stage 3, Sz disorder, hx of vertigo, knee arthroplasty Rt X 2 & Lt X 1    Limitations Lifting;Standing;Walking;House hold activities    Patient Stated Goals To get prosthesis, go to bathroom alone, get around house, get out in community.    Currently in Pain? Yes    Pain Score 4     Pain Location  Other (Comment)   all over with arthritis   Pain Orientation Right;Left    Pain Descriptors / Indicators Aching;Sore    Pain Type Chronic pain   arthritis   Pain Onset In the past 7 days    Pain Frequency Constant    Aggravating Factors  movement & stiffness    Pain Relieving Factors medications    Pain Onset More than a month ago                             [facility] Adult PT Treatment/Exercise - 10/19/19 1355      Transfers   Transfers Sit to Stand;Stand to Sit;Lateral/Scoot Transfers;Squat Pivot Transfers    Sit to Stand --    Stand to Sit --    Stand to Sit Details (indicate cue type and reason) --    Squat Pivot Transfers 2: Max assist;3: Mod assist   MaxA to Nustep 4" higher & ModA to w/c 4" lower     Exercises   Other Exercises  seated push ups from arm rests on nu step seat X 10 reps      Knee/Hip Exercises: Aerobic   Nustep Level 6 with BUEs and Lt LE 10 minutes       Knee/Hip Exercises: Machines for Strengthening   Cybex Leg Press --      Knee/Hip Exercises: Seated   Long Arc Quad Right;2 sets;10 reps;Left    Long Arc Quad Limitations red theraband resistance    Ball Squeeze --    Clamshell with TheraBand --    Other Seated Knee/Hip Exercises straight leg raise AAROM LLE 5 reps 2 sets & RLE 3# 5reps 2 sets    Other Seated Knee/Hip Exercises Right hip internal rotation AAROM 5 reps 2 sets    Hamstring Curl Strengthening;Right;10 reps;2 sets    Hamstring Limitations red theraband resistance    Abduction/Adduction  AROM;Strengthening;Right;Left;2 sets;10 reps    Abd/Adduction Weights 3 lbs.   RLE     Prosthetics   Residual limb condition  residual limb has 2 wounds with red granulated bed. No signs of infection.                     PT Short Term Goals - 10/08/19 1926      PT SHORT TERM GOAL #1   Title Patient & wife verbalize general understanding of adjusting ply socks with limb volume changes.    (All Target STGs: 10/08/2019)     Baseline NOT MET 10/08/2019    Time 1    Period Months    Status Not Met    Target Date 10/08/19      PT SHORT TERM GOAL #2   Title Sit to / from stand w/c to RW with wife's  assistance and maintains upright for 2 minutes with RW support with minA.    Baseline NOT MET 10/08/2019    Time 1    Period Months    Status Not Met    Target Date 10/08/19      PT SHORT TERM GOAL #3   Title Stand pivot transfer with RW with modA    Baseline NOT MET 10/08/2019    Time 1    Period Months    Status Not Met    Target Date 10/08/19      PT SHORT TERM GOAL #4   Title Patient tolerates prosthesis wear >12 hours total /day without wound issues.    Baseline NOT MET 10/08/2019    Time 1    Period Months    Status On-going    Target Date 10/08/19      PT SHORT TERM GOAL #5   Title Patient ambulates 91' with RW & prosthesis with modA.    Baseline NOT MET 10/08/2019    Time 1    Period Months    Status On-going    Target Date 10/08/19             PT Long Term Goals - 08/06/19 2236      PT LONG TERM GOAL #1   Title Patient and wife verbalize & demonstrate understanding of proper prosthetic care & patient able to donne prosthesis modified independent to enable safe utilization of prosthesis. (All LTGs Target Date: 11/04/2019)    Time 3    Period Months    Status On-going    Target Date 11/04/19      PT LONG TERM GOAL #2   Title Patient tolerates wear of prosthesis >90% of awake hours without skin or residual limb pain issues to enable function during his day.    Time 3    Period Months    Status On-going    Target Date 11/04/19      PT LONG TERM GOAL #3   Title Standing balance with RW support: maintains upright 2 minutes, reaches 5" anteriorly and scans environment with supervision.    Time 3    Period Months    Status On-going    Target Date 11/04/19      PT LONG TERM GOAL #4   Title Patient ambulates 100' with RW & prosthesis with wife's assistance / supervision and turns 180*  to position to sit safely.    Time 3    Period Months    Status On-going    Target Date 11/04/19      PT LONG TERM GOAL #5   Title Sit to/from stand and stand-pivot transfers with RW & prosthesis with supervision.    Time 3    Period Months    Status On-going    Target Date 11/04/19                 Plan - 10/19/19 1410    Clinical Impression Statement PT session continues to focus on strengthening exercises while waiting for residual limb to heal to resume prosthetic training.  He participated in exercises without increased arthritic pain but continues to have significant weakness.    Personal Factors and Comorbidities Age;Comorbidity 3+;Fitness;Past/Current Experience;Time since onset of injury/illness/exacerbation    Comorbidities Rt TTA, C2-5 Cervical Fusion 2014, Tachypnea, encephalopathy, RLE paralysis, TIA, A-Fib, CAD, CHF, CKD stage 3, Sz disorder, hx of vertigo, knee arthroplasty Rt X 2 & Lt X 1    Examination-Activity Limitations Caring for Others;Locomotion  Level;Reach Overhead;Squat;Stairs;Stand;Transfers    Examination-Participation Restrictions Community Activity    Stability/Clinical Decision Making Evolving/Moderate complexity    Rehab Potential Good    PT Frequency 2x / week    PT Duration Other (comment)   13 weeks (90 days)   PT Treatment/Interventions ADLs/Self Care Home Management;DME Instruction;Gait training;Stair training;Functional mobility training;Therapeutic activities;Therapeutic exercise;Balance training;Neuromuscular re-education;Patient/family education;Prosthetic Training;Passive range of motion;Vestibular    PT Next Visit Plan how does wound look. Nustep & leg press exercise, sitting exercises without back support    Consulted and Agree with Plan of Care Patient;Family member/caregiver    Family Member Consulted wife, Koron Godeaux           Patient will benefit from skilled therapeutic intervention in order to improve the following deficits  and impairments:  Abnormal gait, Cardiopulmonary status limiting activity, Decreased activity tolerance, Decreased balance, Decreased endurance, Decreased knowledge of use of DME, Decreased mobility, Decreased range of motion, Decreased skin integrity, Decreased strength, Difficulty walking, Increased edema, Impaired flexibility, Postural dysfunction, Prosthetic Dependency, Pain  Visit Diagnosis: Unsteadiness on feet  Other abnormalities of gait and mobility  Abnormal posture  Stiffness of right knee, not elsewhere classified  Muscle weakness (generalized)  History of falling  Contracture of muscle, multiple sites     Problem List Patient Active Problem List   Diagnosis Date Noted  . Dehiscence of amputation stump ([place])   . Wound infection 05/09/2018  . AKI (acute kidney injury) ([place])   . S/P BKA (below knee amputation) unilateral, right ([place])   . Below-knee amputation with complication, initial encounter ([place])   . Tachypnea   . Post-operative pain   . Subacute osteomyelitis, right ankle and foot ([place])   . Paralysis of right lower extremity ([place])   . TIA (transient ischemic attack) 03/24/2018  . Encephalopathy 03/24/2018  . Cellulitis 03/15/2018  . Hypernatremia 03/15/2018  . Persistent atrial fibrillation ([place]) 11/10/2017  . Severe muscle deconditioning 11/10/2017  . Hemorrhage 10/22/2017  . CAD S/P percutaneous coronary angioplasty 10/22/2017  . Acute hypoxemic respiratory failure ([place]) 07/17/2017  . Aspiration syndrome, subsequent encounter 07/11/2017  . Aspiration pneumonia ([place]) 07/01/2017  . Acute encephalopathy 07/01/2017  . Thrombocytopenia ([place]) 07/01/2017  . Chronic diastolic (congestive) heart failure ([place]) 07/01/2017  . Anemia of chronic disease 07/01/2017  . Troponin level elevated 07/01/2017  . Chronic atrial fibrillation 07/01/2017  . Goals of care, counseling/discussion   . Palliative care by specialist   . Acute metabolic encephalopathy 94/50/3888    . CKD (chronic kidney disease) stage 3, GFR 30-59 ml/min   . CAP (community acquired pneumonia) 04/17/2017  . Hallux rigidus, right foot 03/10/2016  . Lumbosacral spondylosis without myelopathy 10/29/2015  . Memory difficulty 09/22/2015  . Abnormality of gait 09/22/2015  . Hyperglycemia 07/06/2015  . Chronic pain 07/06/2015  . Chronic insomnia 03/30/2015  . Transient alteration of awareness 03/30/2015  . Abnormal liver function   . Altered mental status 01/31/2015  . Essential hypertension 01/31/2015  . Constipation 01/31/2015  . Hypothyroidism 01/31/2015  . Seizure disorder ([place]) 01/31/2015  . Bladder outlet obstruction 01/31/2015  . GERD (gastroesophageal reflux disease) 01/31/2015  . Chronic back pain 01/31/2015  . Acute kidney injury ([place]) 01/31/2015    Jamey Reas PT, DPT 10/19/2019, 3:15 PM  [facility] [address] [place], [place], [address] Phone: [phone]   Fax:  [phone]  Name: [name] MRN: [id number] Date of Birth: Feb 15, 1934

## 2019-10-21 ENCOUNTER — Other Ambulatory Visit: Payer: Self-pay

## 2019-10-21 ENCOUNTER — Encounter: Payer: Self-pay | Admitting: Physical Therapy

## 2019-10-21 ENCOUNTER — Ambulatory Visit (INDEPENDENT_AMBULATORY_CARE_PROVIDER_SITE_OTHER): Payer: Medicare Other | Admitting: Physical Therapy

## 2019-10-21 DIAGNOSIS — R2689 Other abnormalities of gait and mobility: Secondary | ICD-10-CM

## 2019-10-21 DIAGNOSIS — M6249 Contracture of muscle, multiple sites: Secondary | ICD-10-CM

## 2019-10-21 DIAGNOSIS — Z9181 History of falling: Secondary | ICD-10-CM

## 2019-10-21 DIAGNOSIS — R2681 Unsteadiness on feet: Secondary | ICD-10-CM | POA: Diagnosis not present

## 2019-10-21 DIAGNOSIS — M25661 Stiffness of right knee, not elsewhere classified: Secondary | ICD-10-CM

## 2019-10-21 DIAGNOSIS — R293 Abnormal posture: Secondary | ICD-10-CM | POA: Diagnosis not present

## 2019-10-21 DIAGNOSIS — M6281 Muscle weakness (generalized): Secondary | ICD-10-CM

## 2019-10-21 NOTE — Therapy (Signed)
[facility] [address] [place], [place], [address] Phone: [phone]   Fax:  [phone]  Physical Therapy Treatment  Patient Details  Name: [name] MRN: [id number] Date of Birth: June 28, 1933 Referring Provider (PT): Bevely Palmer Persons, [place]   Encounter Date: 10/21/2019   PT End of Session - 10/21/19 1611    Visit Number 36    Number of Visits 31    Date for PT Re-Evaluation 11/04/19    Authorization Type BCBS & Medicare    PT Start Time 1351    PT Stop Time 1433    PT Time Calculation (min) 42 min    Equipment Utilized During Treatment Gait belt    Activity Tolerance Patient tolerated treatment well;Patient limited by fatigue    Behavior During Therapy [facility] for tasks assessed/performed           Past Medical History:  Diagnosis Date  . Abnormality of gait 09/22/2015  . Arthritis   . Atrial fibrillation ([place])   . CAD (coronary artery disease)    Stent to RCA, Penta stent, 99% reduced to 0% 2002.  . Cancer ([place])    skin CA removed from back  . Chronic insomnia 03/30/2015  . Complication of anesthesia    trouble waking up  . GERD (gastroesophageal reflux disease)   . Hypercholesteremia   . Hypertension   . Hypothyroidism   . Memory difficulty 09/22/2015  . Osteoarthritis   . Pneumonia   . Seizures ([place])   . Sepsis ([place]) 05/2017  . Transient alteration of awareness 03/30/2015  . Vertigo    hx of    Past Surgical History:  Procedure Laterality Date  . AMPUTATION Right 03/28/2018   Procedure: AMPUTATION BELOW KNEE;  Surgeon: Newt Minion, MD;  Location: [place];  Service: Orthopedics;  Laterality: Right;  . APPLICATION OF WOUND VAC Right 01/23/2019   Procedure: Application Of  Prevena Wound Vac;  Surgeon: Newt Minion, MD;  Location: [place];  Service: Orthopedics;  Laterality: Right;  . BACK SURGERY    . EYE SURGERY     Bilateral Cataract surgery   . HERNIA REPAIR    . I & D EXTREMITY Right 05/10/2015   Procedure:  IRRIGATION AND DEBRIDEMENT EXTREMITY;  Surgeon: Leanora Cover, MD;  Location: [place];  Service: Orthopedics;  Laterality: Right;  . KNEE ARTHROPLASTY     right knee X 2; left knee once  . LAMINECTOMY     X 6  . LEG AMPUTATION BELOW KNEE Right 03/28/2018  . POSTERIOR CERVICAL FUSION/FORAMINOTOMY  01/28/2012   Procedure: POSTERIOR CERVICAL FUSION/FORAMINOTOMY LEVEL 3;  Surgeon: [name], MD;  Location: [place] [facility];  Service: Neurosurgery;  Laterality: Left;  Posterior Cervical Five-Thoracic One Fusion, Arthrodesis with LEFT Cervical Seven-thoracic One Laminectomy, Foraminotomy and Resection of Synovial Cyst  . POSTERIOR CERVICAL FUSION/FORAMINOTOMY N/A 01/29/2013   Procedure: POSTERIOR CERVICAL FUSION/FORAMINOTOMY LEVEL 1 and C2-5 Posteriolateral Arthrodesis;  Surgeon: [name], MD;  Location: [place] [facility];  Service: Neurosurgery;  Laterality: N/A;  C2-C3 Laminectomy C2-C3 posterior cervical arthrodesis  . STUMP REVISION Right 01/23/2019   Procedure: REVISION RIGHT BELOW KNEE AMPUTATION;  Surgeon: Newt Minion, MD;  Location: [place];  Service: Orthopedics;  Laterality: Right;  . TONSILLECTOMY      There were no vitals filed for this visit.   Subjective Assessment - 10/21/19 1350    Subjective His wife feels that his wound is getting better but still unable to use  prosthesis yet.    Patient is accompained by: Family member   wife - [name]   Pertinent History Rt TTA, C2-5 Cervical Fusion 2014, Tachypnea, encephalopathy, RLE paralysis, TIA, A-Fib, CAD, CHF, CKD stage 3, Sz disorder, hx of vertigo, knee arthroplasty Rt X 2 & Lt X 1    Limitations Lifting;Standing;Walking;House hold activities    Patient Stated Goals To get prosthesis, go to bathroom alone, get around house, get out in community.    Currently in Pain? Yes    Pain Score 5     Pain Location Other (Comment)   arthritis all over   Pain Descriptors / Indicators Aching;Sore    Pain Type Chronic  pain    Pain Onset More than a month ago    Pain Frequency Constant    Aggravating Factors  stiffness & increased activities    Pain Relieving Factors medications & rest    Pain Onset More than a month ago              PT noticed serous drainage from left calf during session. Upon closure inspection under compression sock, he had small cut in posterior calf with no signs of infection. PT covered with bandage provided by his wife who keeps them on hand due to his fragile skin.                 [place] Adult PT Treatment/Exercise - 10/21/19 1613      Transfers   Transfers Sit to Stand;Stand to Sit;Lateral/Scoot Programmer, applications Transfers 2: Max assist;3: Mod assist   MaxA to Nustep 4" higher & ModA to w/c 4" lower     Exercises   Other Exercises  seated push ups from arm rests on nu step seat X 10 reps      Knee/Hip Exercises: Aerobic   Nustep Level 6 with BUEs and Lt LE 10 minutes       Knee/Hip Exercises: Machines for Strengthening   Cybex Leg Press shuttle leg press 50# 3X15 reps      Knee/Hip Exercises: Seated   Other Seated Knee/Hip Exercises Right hip internal rotation AAROM 5 reps 2 sets      Prosthetics   Residual limb condition  residual limb has 2 wounds with red granulated bed. No signs of infection.                     PT Short Term Goals - 10/08/19 1926      PT SHORT TERM GOAL #1   Title Patient & wife verbalize general understanding of adjusting ply socks with limb volume changes.    (All Target STGs: 10/08/2019)    Baseline NOT MET 10/08/2019    Time 1    Period Months    Status Not Met    Target Date 10/08/19      PT SHORT TERM GOAL #2   Title Sit to / from stand w/c to RW with wife's assistance and maintains upright for 2 minutes with RW support with minA.    Baseline NOT MET 10/08/2019    Time 1    Period Months    Status Not Met    Target Date 10/08/19      PT SHORT TERM GOAL #3   Title Stand pivot  transfer with RW with modA    Baseline NOT MET 10/08/2019    Time 1    Period Months    Status Not Met  Target Date 10/08/19      PT SHORT TERM GOAL #4   Title Patient tolerates prosthesis wear >12 hours total /day without wound issues.    Baseline NOT MET 10/08/2019    Time 1    Period Months    Status On-going    Target Date 10/08/19      PT SHORT TERM GOAL #5   Title Patient ambulates 44' with RW & prosthesis with modA.    Baseline NOT MET 10/08/2019    Time 1    Period Months    Status On-going    Target Date 10/08/19             PT Long Term Goals - 08/06/19 2236      PT LONG TERM GOAL #1   Title Patient and wife verbalize & demonstrate understanding of proper prosthetic care & patient able to donne prosthesis modified independent to enable safe utilization of prosthesis. (All LTGs Target Date: 11/04/2019)    Time 3    Period Months    Status On-going    Target Date 11/04/19      PT LONG TERM GOAL #2   Title Patient tolerates wear of prosthesis >90% of awake hours without skin or residual limb pain issues to enable function during his day.    Time 3    Period Months    Status On-going    Target Date 11/04/19      PT LONG TERM GOAL #3   Title Standing balance with RW support: maintains upright 2 minutes, reaches 5" anteriorly and scans environment with supervision.    Time 3    Period Months    Status On-going    Target Date 11/04/19      PT LONG TERM GOAL #4   Title Patient ambulates 100' with RW & prosthesis with wife's assistance / supervision and turns 180* to position to sit safely.    Time 3    Period Months    Status On-going    Target Date 11/04/19      PT LONG TERM GOAL #5   Title Sit to/from stand and stand-pivot transfers with RW & prosthesis with supervision.    Time 3    Period Months    Status On-going    Target Date 11/04/19                 Plan - 10/21/19 1611    Clinical Impression Statement PT session continues to focus on  strength & endurance without prosthesis until his limb heals to enable prosthetic training. He tolerated exercises without unsafe physiological changes.    Personal Factors and Comorbidities Age;Comorbidity 3+;Fitness;Past/Current Experience;Time since onset of injury/illness/exacerbation    Comorbidities Rt TTA, C2-5 Cervical Fusion 2014, Tachypnea, encephalopathy, RLE paralysis, TIA, A-Fib, CAD, CHF, CKD stage 3, Sz disorder, hx of vertigo, knee arthroplasty Rt X 2 & Lt X 1    Examination-Activity Limitations Caring for Others;Locomotion Level;Reach Overhead;Squat;Stairs;Stand;Transfers    Examination-Participation Restrictions Community Activity    Stability/Clinical Decision Making Evolving/Moderate complexity    Rehab Potential Good    PT Frequency 2x / week    PT Duration Other (comment)   13 weeks (90 days)   PT Treatment/Interventions ADLs/Self Care Home Management;DME Instruction;Gait training;Stair training;Functional mobility training;Therapeutic activities;Therapeutic exercise;Balance training;Neuromuscular re-education;Patient/family education;Prosthetic Training;Passive range of motion;Vestibular    PT Next Visit Plan how does wound look. Nustep & leg press exercise, sitting exercises without back support    Consulted and Agree with Plan  of Care Patient;Family member/caregiver    Family Member Consulted wife, Evin Loiseau           Patient will benefit from skilled therapeutic intervention in order to improve the following deficits and impairments:  Abnormal gait, Cardiopulmonary status limiting activity, Decreased activity tolerance, Decreased balance, Decreased endurance, Decreased knowledge of use of DME, Decreased mobility, Decreased range of motion, Decreased skin integrity, Decreased strength, Difficulty walking, Increased edema, Impaired flexibility, Postural dysfunction, Prosthetic Dependency, Pain  Visit Diagnosis: Unsteadiness on feet  Other abnormalities of gait and  mobility  Abnormal posture  Stiffness of right knee, not elsewhere classified  Muscle weakness (generalized)  History of falling  Contracture of muscle, multiple sites     Problem List Patient Active Problem List   Diagnosis Date Noted  . Dehiscence of amputation stump ([place])   . Wound infection 05/09/2018  . AKI (acute kidney injury) ([place])   . S/P BKA (below knee amputation) unilateral, right ([place])   . Below-knee amputation with complication, initial encounter ([place])   . Tachypnea   . Post-operative pain   . Subacute osteomyelitis, right ankle and foot ([place])   . Paralysis of right lower extremity ([place])   . TIA (transient ischemic attack) 03/24/2018  . Encephalopathy 03/24/2018  . Cellulitis 03/15/2018  . Hypernatremia 03/15/2018  . Persistent atrial fibrillation ([place]) 11/10/2017  . Severe muscle deconditioning 11/10/2017  . Hemorrhage 10/22/2017  . CAD S/P percutaneous coronary angioplasty 10/22/2017  . Acute hypoxemic respiratory failure ([place]) 07/17/2017  . Aspiration syndrome, subsequent encounter 07/11/2017  . Aspiration pneumonia ([place]) 07/01/2017  . Acute encephalopathy 07/01/2017  . Thrombocytopenia ([place]) 07/01/2017  . Chronic diastolic (congestive) heart failure ([place]) 07/01/2017  . Anemia of chronic disease 07/01/2017  . Troponin level elevated 07/01/2017  . Chronic atrial fibrillation 07/01/2017  . Goals of care, counseling/discussion   . Palliative care by specialist   . Acute metabolic encephalopathy 01/65/5374  . CKD (chronic kidney disease) stage 3, GFR 30-59 ml/min   . CAP (community acquired pneumonia) 04/17/2017  . Hallux rigidus, right foot 03/10/2016  . Lumbosacral spondylosis without myelopathy 10/29/2015  . Memory difficulty 09/22/2015  . Abnormality of gait 09/22/2015  . Hyperglycemia 07/06/2015  . Chronic pain 07/06/2015  . Chronic insomnia 03/30/2015  . Transient alteration of awareness 03/30/2015  . Abnormal liver function   . Altered  mental status 01/31/2015  . Essential hypertension 01/31/2015  . Constipation 01/31/2015  . Hypothyroidism 01/31/2015  . Seizure disorder ([place]) 01/31/2015  . Bladder outlet obstruction 01/31/2015  . GERD (gastroesophageal reflux disease) 01/31/2015  . Chronic back pain 01/31/2015  . Acute kidney injury ([place]) 01/31/2015    Jamey Reas, PT, DPT 10/21/2019, [facility] [address] [place], [place], [address] Phone: [phone]   Fax:  [phone]  Name: [name] MRN: [id number] Date of Birth: 07-May-1933

## 2019-10-22 ENCOUNTER — Ambulatory Visit (INDEPENDENT_AMBULATORY_CARE_PROVIDER_SITE_OTHER): Payer: Medicare Other | Admitting: Cardiovascular Disease

## 2019-10-22 ENCOUNTER — Encounter: Payer: Self-pay | Admitting: Cardiovascular Disease

## 2019-10-22 VITALS — BP 122/71 | HR 73 | Ht 69.0 in | Wt 155.0 lb

## 2019-10-22 DIAGNOSIS — E7800 Pure hypercholesterolemia, unspecified: Secondary | ICD-10-CM | POA: Diagnosis not present

## 2019-10-22 DIAGNOSIS — I2510 Atherosclerotic heart disease of native coronary artery without angina pectoris: Secondary | ICD-10-CM | POA: Diagnosis not present

## 2019-10-22 DIAGNOSIS — D539 Nutritional anemia, unspecified: Secondary | ICD-10-CM

## 2019-10-22 DIAGNOSIS — N1831 Chronic kidney disease, stage 3a: Secondary | ICD-10-CM

## 2019-10-22 DIAGNOSIS — I5032 Chronic diastolic (congestive) heart failure: Secondary | ICD-10-CM | POA: Diagnosis not present

## 2019-10-22 DIAGNOSIS — Z89511 Acquired absence of right leg below knee: Secondary | ICD-10-CM

## 2019-10-22 DIAGNOSIS — I4821 Permanent atrial fibrillation: Secondary | ICD-10-CM

## 2019-10-22 DIAGNOSIS — Z7952 Long term (current) use of systemic steroids: Secondary | ICD-10-CM

## 2019-10-22 MED ORDER — POTASSIUM CHLORIDE CRYS ER 20 MEQ PO TBCR: 10.0000 meq | EXTENDED_RELEASE_TABLET | Freq: Every day | ORAL | 3 refills | Status: DC

## 2019-10-22 NOTE — Patient Instructions (Signed)

## 2019-10-22 NOTE — Progress Notes (Signed)
Cardiology Office Note:    Date:  10/24/2019   ID:  [name], DOB Aug 02, 1933, MRN [id number]  PCP:  [name], MD  Cardiologist:  Sanda Klein, MD   Referring MD: [name], MD   Chief Complaint  Patient presents with  . Congestive Heart Failure    History of Present Illness:    [name] is a 86 y.o. 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  He has done remarkably well in the last year from a heart failure point of view.  He has not had any episodes of heart failure exacerbation or hospitalization.  His weight today is virtually identical to his last appointment.  Has occasional swelling in the left lower extremity, above the ring of his knee-high compression stocking.    The right amputation stump required revision but has subsequently healed well.  Recently he developed another skin excoriation from his prosthesis which she can no longer wear.  It is healing well.  Unfortunately he has a few wounds on the toes of his left foot.  He is wheelchair-bound.  He has frequent urinary tract infections.  He denies chest pain, palpitations, orthopnea, PND, syncope or dizziness.  He is unable to stand up on the scale to weigh himself due to his amputation.  His wife has been using the calf circumference as a marker of fluid accumulation.  He had normal left ventricular systolic function on his last echo performed in January 2020.   Past Medical History:  Diagnosis Date  . Abnormality of gait 09/22/2015  . Arthritis   . Atrial fibrillation ([place])   . CAD (coronary artery disease)     Stent to RCA, Penta stent, 99% reduced to 0% 2002.  . Cancer ([place])    skin CA removed from back  . Chronic insomnia 03/30/2015  . Complication of anesthesia    trouble waking up  . GERD (gastroesophageal reflux disease)   . Hypercholesteremia   . Hypertension   . Hypothyroidism   . Memory difficulty 09/22/2015  . Osteoarthritis   . Pneumonia   . Seizures ([place])   . Sepsis ([place]) 05/2017  . Transient alteration of awareness 03/30/2015  . Vertigo    hx of    Past Surgical History:  Procedure Laterality Date  . AMPUTATION Right 03/28/2018   Procedure: AMPUTATION BELOW KNEE;  Surgeon: Newt Minion, MD;  Location: [facility];  Service: Orthopedics;  Laterality: Right;  . APPLICATION OF WOUND VAC Right 01/23/2019   Procedure: Application Of  Prevena Wound Vac;  Surgeon: Newt Minion, MD;  Location: [facility];  Service: Orthopedics;  Laterality: Right;  . BACK SURGERY    . EYE SURGERY     Bilateral Cataract surgery   . HERNIA REPAIR    . I & D EXTREMITY Right 05/10/2015   Procedure: IRRIGATION AND DEBRIDEMENT EXTREMITY;  Surgeon: Leanora Cover, MD;  Location: [facility];  Service: Orthopedics;  Laterality: Right;  . KNEE ARTHROPLASTY     right knee X 2; left knee once  . LAMINECTOMY     X 6  . LEG AMPUTATION BELOW KNEE Right 03/28/2018  . POSTERIOR CERVICAL FUSION/FORAMINOTOMY  01/28/2012  Procedure: POSTERIOR CERVICAL FUSION/FORAMINOTOMY LEVEL 3;  Surgeon: [name], MD;  Location: [facility];  Service: Neurosurgery;  Laterality: Left;  Posterior Cervical Five-Thoracic One Fusion, Arthrodesis with LEFT Cervical Seven-thoracic One Laminectomy, Foraminotomy and Resection of Synovial Cyst  . POSTERIOR CERVICAL FUSION/FORAMINOTOMY N/A 01/29/2013   Procedure: POSTERIOR CERVICAL FUSION/FORAMINOTOMY LEVEL 1 and C2-5 Posteriolateral Arthrodesis;  Surgeon: [name], MD;  Location: [facility];  Service: Neurosurgery;  Laterality: N/A;  C2-C3 Laminectomy C2-C3 posterior  cervical arthrodesis  . STUMP REVISION Right 01/23/2019   Procedure: REVISION RIGHT BELOW KNEE AMPUTATION;  Surgeon: Newt Minion, MD;  Location: [facility];  Service: Orthopedics;  Laterality: Right;  . TONSILLECTOMY      Current Medications: Current Meds  Medication Sig  . alendronate (FOSAMAX) 70 MG tablet Take 70 mg by mouth every Wednesday. Remain upright for 30-60 minutes.  Marland Kitchen aspirin EC 81 MG tablet Take 81 mg by mouth daily.  . Calcium Carb-Cholecalciferol (CALCIUM 600+D) 600-800 MG-UNIT TABS Take 1 tablet by mouth daily.  Marland Kitchen diltiazem (CARDIZEM CD) 240 MG 24 hr capsule TAKE 1 CAPSULE BY MOUTH EVERY MORNING. HOLD IF SYSTOLIC BLOOD PRESSURE IS LESS THAN 100 (Patient taking differently: Take 240 mg by mouth daily. HOLD IF SYSTOLIC BLOOD PRESSURE IS LESS THAN 100)  . divalproex (DEPAKOTE ER) 250 MG 24 hr tablet Take 250-500 mg by mouth See admin instructions. Take 250 mg in the morning and 500 mg in the evening  . doxazosin (CARDURA) 4 MG tablet Take 1 tablet (4 mg total) by mouth at bedtime.  Marland Kitchen HYDROcodone-acetaminophen (NORCO) 10-325 MG tablet Take 1 tablet by mouth every 4 (four) hours as needed for pain.  Marland Kitchen ketoconazole (NIZORAL) 2 % shampoo Apply 1 application topically 2 (two) times a week.  . levothyroxine (SYNTHROID, LEVOTHROID) 150 MCG tablet Take 150 mcg by mouth daily before breakfast.  . lidocaine (LIDODERM) 5 % Place 1 patch onto the skin daily as needed (pain). Remove & Discard patch within 12 hours or as directed by MD  . meloxicam (MOBIC) 15 MG tablet Take 15 mg by mouth daily.  . metoprolol tartrate (LOPRESSOR) 25 MG tablet TAKE 1 TABLET BY MOUTH TWICE DAILY WITH A MEAL OR IMMEDIATELY AFTER  . Multiple Vitamins-Minerals (CERTAVITE/ANTIOXIDANTS) TABS Take 1 tablet by mouth every evening.   . mupirocin ointment (BACTROBAN) 2 % Apply 1 application topically daily. Apply to right lateral leg wound daily  . nitrofurantoin (MACRODANTIN) 100 MG capsule Take 100 mg by mouth daily.  .  nitroGLYCERIN (NITROSTAT) 0.4 MG SL tablet Place 1 tablet (0.4 mg total) under the tongue every 5 (five) minutes as needed for chest pain (call 911 afer 3 doses and if chest pain persists).  Marland Kitchen oxymetazoline (AFRIN) 0.05 % nasal spray Place 1 spray into both nostrils 2 (two) times daily as needed for congestion.  . potassium chloride SA (KLOR-CON) 20 MEQ tablet Take 0.5 tablets (10 mEq total) by mouth daily.  . pravastatin (PRAVACHOL) 40 MG tablet Take 40 mg by mouth every evening.   . predniSONE (DELTASONE) 1 MG tablet Take 9 mg by mouth daily.  . [DISCONTINUED] oxyCODONE-acetaminophen (PERCOCET) 5-325 MG tablet Take 1 tablet by mouth every 4 (four) hours as needed for severe pain.  . [DISCONTINUED] potassium chloride SA (K-DUR) 20 MEQ tablet Take 10 mEq by mouth daily.   . [DISCONTINUED] trimethoprim (TRIMPEX) 100 MG tablet Take 100 mg by mouth daily.     Allergies:   Eliquis [apixaban], Demerol [meperidine], Keppra [levetiracetam],  and Vimpat [lacosamide]   Social History   Socioeconomic History  . Marital status: Married    Spouse name: [name]  . Number of children: 3  . Years of education: 82  . Highest education level: Not on file  Occupational History  . Occupation: retired Software engineer  Tobacco Use  . Smoking status: Former Research scientist (life sciences)  . Smokeless tobacco: Never Used  Vaping Use  . Vaping Use: Never used  Substance and Sexual Activity  . Alcohol use: Not Currently    Comment: rare  . Drug use: No  . Sexual activity: Not Currently  Other Topics Concern  . Not on file  Social History Narrative   Lives at home w/ his wife [name]   Patient drinks 4-5 cups of coffee daily.   Patient is right handed.    Social Determinants of Health   Financial Resource Strain:   . Difficulty of Paying Living Expenses:   Food Insecurity:   . Worried About Charity fundraiser in the Last Year:   . Arboriculturist in the Last Year:   Transportation Needs:   . Film/video editor  (Medical):   Marland Kitchen Lack of Transportation (Non-Medical):   Physical Activity:   . Days of Exercise per Week:   . Minutes of Exercise per Session:   Stress:   . Feeling of Stress :   Social Connections:   . Frequency of Communication with Friends and Family:   . Frequency of Social Gatherings with Friends and Family:   . Attends Religious Services:   . Active Member of Clubs or Organizations:   . Attends Archivist Meetings:   Marland Kitchen Marital Status:      Family History: The patient's family history includes Cancer in his mother; Heart disease in his father; Hypertension in his mother; Kidney failure in his father.  ROS:   Please see the history of present illness.    All other systems are reviewed and are negative.   EKGs/Labs/Other Studies Reviewed:     EKG:  EKG is ordered today.  It shows atrial fibrillation with controlled ventricular response, otherwise normal tracing.  Recent Labs: 10/16/2019: ALT 18; BNP 628.2; BUN 33; Creatinine, Ser 1.33; Hemoglobin 11.0; Platelets 95; Potassium 4.3; Sodium 141  Recent Lipid Panel    Component Value Date/Time   CHOL 121 10/16/2019 1220   TRIG 105 10/16/2019 1220   HDL 58 10/16/2019 1220   CHOLHDL 2.1 10/16/2019 1220   CHOLHDL 2.3 03/26/2018 0445   VLDL 15 03/26/2018 0445   LDLCALC 44 10/16/2019 1220    Physical Exam:    VS:  BP 122/71   Pulse 73   Ht 5\' 9"  (1.753 m)   Wt 155 lb (70.3 kg)   SpO2 97%   BMI 22.89 kg/m     Wt Readings from Last 3 Encounters:  10/22/19 155 lb (70.3 kg)  07/10/19 155 lb (70.3 kg)  05/07/19 150 lb (68 kg)     General: Alert, oriented x3, no distress, appears comfortable sitting in a wheelchair Head: no evidence of trauma, PERRL, EOMI, no exophtalmos or lid lag, no myxedema, no xanthelasma; normal ears, nose and oropharynx Neck: normal jugular venous pulsations and no hepatojugular reflux; brisk carotid pulses without delay and no carotid bruits Chest: clear to auscultation, no signs of  consolidation by percussion or palpation, normal fremitus, symmetrical and full respiratory excursions Cardiovascular: normal position and quality of the apical impulse, irregular rhythm, normal first and second heart sounds, no  murmurs, rubs or gallops Abdomen: no tenderness or distention, no masses by palpation, no abnormal pulsatility or arterial bruits, normal bowel sounds, no hepatosplenomegaly Extremities: no clubbing, cyanosis or edema; amputation below right knee, compression stocking on left knee, minimal edema at the level of the upper limit of the stocking. Neurological: grossly nonfocal Psych: Normal mood and affect    ASSESSMENT:    1. Chronic diastolic heart failure ([place])   2. Permanent atrial fibrillation ([place])   3. Coronary artery disease involving native coronary artery of native heart without angina pectoris   4. Hypercholesterolemia   5. Stage 3a chronic kidney disease   6. Current chronic use of systemic steroids   7. Hx of BKA, right ([place])   8. Macrocytic anemia    PLAN:    In order of problems listed above:  1. CHF: Even though we are unable to weigh him daily, he is maintaining virtually the same weight since last year.  I think his wife is doing an excellent job with sodium dietary restriction and monitoring for fluid overload.  His creatinine is ever slightly higher than baseline, but he does not appear overtly hypervolemic today. 2. AFib: Permanent arrhythmia, well rate controlled.  He has had several severe bleeding events including spontaneous bleeding into his right leg and he is not on anticoagulation.. 3. CAD: No angina.  He has not required percutaneous revascularization since 2002.  Normal left ventricular systolic function. 4. HLP: On statin, all lipid parameters are excellent 5. CKD 3: Creatinine 1.3 is what we had previously estimated his baseline to be, but over the last year he has had creatinine in the 1.1 range. 6. Chronic steroid therapy: Attempts  to wean him off his leads to generalized aches and pains.  I wonder whether he has polymyalgia rheumatica.  He needs to be aware that he will require "stress doses of hydrocortisone" for any serious illnesses or major surgeries. 7. R BKA: Healed after stump revision, but now has a new lesion. 8. Chronic anemia: Improved.  Macrocytic.  Had normal B12 level last year.   Medication Adjustments/Labs and Tests Ordered: Current medicines are reviewed at length with the patient today.  Concerns regarding medicines are outlined above.  Orders Placed This Encounter  Procedures  . EKG 12-Lead   Meds ordered this encounter  Medications  . potassium chloride SA (KLOR-CON) 20 MEQ tablet    Sig: Take 0.5 tablets (10 mEq total) by mouth daily.    Dispense:  45 tablet    Refill:  3    Patient Instructions  Medication Instructions:  No changes *If you need a refill on your cardiac medications before your next appointment, please call your pharmacy*   Lab Work: None ordered If you have labs (blood work) drawn today and your tests are completely normal, you will receive your results only by: Marland Kitchen MyChart Message (if you have MyChart) OR . A paper copy in the mail If you have any lab test that is abnormal or we need to change your treatment, we will call you to review the results.   Testing/Procedures: None ordered   Follow-Up: At [facility] you and your health needs are our priority.  As part of our continuing mission to provide you with exceptional heart care, we have created designated Provider Care Teams.  These Care Teams include your primary Cardiologist (physician) and Advanced Practice Providers (APPs -  Physician Assistants and Nurse Practitioners) who all work together to provide you with the care you  need, when you need it.  We recommend signing up for the patient portal called "MyChart".  Sign up information is provided on this After Visit Summary.  MyChart is used to connect with  patients for Virtual Visits (Telemedicine).  Patients are able to view lab/test results, encounter notes, upcoming appointments, etc.  Non-urgent messages can be sent to your provider as well.   To learn more about what you can do with MyChart, go to [email or web address].    Your next appointment:   12 month(s)  The format for your next appointment:   In Person  Provider:   You may see Sanda Klein, MD or one of the following Advanced Practice Providers on your designated Care Team:    Almyra Deforest, PA-C  Fabian Sharp, [place] or   Roby Lofts, PA-C       Signed, Sanda Klein, MD  10/24/2019 7:03 PM    [place]

## 2019-10-24 ENCOUNTER — Encounter: Payer: Self-pay | Admitting: Cardiovascular Disease

## 2019-10-24 DIAGNOSIS — D539 Nutritional anemia, unspecified: Secondary | ICD-10-CM | POA: Insufficient documentation

## 2019-10-24 DIAGNOSIS — Z7952 Long term (current) use of systemic steroids: Secondary | ICD-10-CM | POA: Insufficient documentation

## 2019-10-24 DIAGNOSIS — E7800 Pure hypercholesterolemia, unspecified: Secondary | ICD-10-CM | POA: Insufficient documentation

## 2019-10-24 DIAGNOSIS — I4821 Permanent atrial fibrillation: Secondary | ICD-10-CM | POA: Insufficient documentation

## 2019-10-26 ENCOUNTER — Ambulatory Visit (INDEPENDENT_AMBULATORY_CARE_PROVIDER_SITE_OTHER): Payer: Medicare Other | Admitting: Physical Therapy

## 2019-10-26 ENCOUNTER — Encounter: Payer: Self-pay | Admitting: Physical Therapy

## 2019-10-26 ENCOUNTER — Other Ambulatory Visit: Payer: Self-pay

## 2019-10-26 DIAGNOSIS — M6249 Contracture of muscle, multiple sites: Secondary | ICD-10-CM

## 2019-10-26 DIAGNOSIS — R293 Abnormal posture: Secondary | ICD-10-CM | POA: Diagnosis not present

## 2019-10-26 DIAGNOSIS — Z9181 History of falling: Secondary | ICD-10-CM

## 2019-10-26 DIAGNOSIS — R2689 Other abnormalities of gait and mobility: Secondary | ICD-10-CM

## 2019-10-26 DIAGNOSIS — R2681 Unsteadiness on feet: Secondary | ICD-10-CM

## 2019-10-26 DIAGNOSIS — M6281 Muscle weakness (generalized): Secondary | ICD-10-CM

## 2019-10-26 DIAGNOSIS — M25661 Stiffness of right knee, not elsewhere classified: Secondary | ICD-10-CM

## 2019-10-26 DIAGNOSIS — M542 Cervicalgia: Secondary | ICD-10-CM

## 2019-10-26 NOTE — Therapy (Signed)
[facility] Physical Therapy [address] [place], [place], [address] Phone: [phone]   Fax:  [phone]  Physical Therapy Treatment  Patient Details  Name: [name] MRN: [id number] Date of Birth: Nov 07, 1933 Referring Provider (PT): Bevely Palmer Persons, [place]   Encounter Date: 10/26/2019   PT End of Session - 10/26/19 1359    Visit Number 37    Number of Visits 48    Date for PT Re-Evaluation 11/04/19    Authorization Type BCBS & Medicare    PT Start Time 1348    PT Stop Time 1428    PT Time Calculation (min) 40 min    Equipment Utilized During Treatment Gait belt    Activity Tolerance Patient tolerated treatment well;Patient limited by fatigue    Behavior During Therapy [facility] for tasks assessed/performed           Past Medical History:  Diagnosis Date  . Abnormality of gait 09/22/2015  . Arthritis   . Atrial fibrillation ([place])   . CAD (coronary artery disease)    Stent to RCA, Penta stent, 99% reduced to 0% 2002.  . Cancer ([place])    skin CA removed from back  . Chronic insomnia 03/30/2015  . Complication of anesthesia    trouble waking up  . GERD (gastroesophageal reflux disease)   . Hypercholesteremia   . Hypertension   . Hypothyroidism   . Memory difficulty 09/22/2015  . Osteoarthritis   . Pneumonia   . Seizures ([place])   . Sepsis ([place]) 05/2017  . Transient alteration of awareness 03/30/2015  . Vertigo    hx of    Past Surgical History:  Procedure Laterality Date  . AMPUTATION Right 03/28/2018   Procedure: AMPUTATION BELOW KNEE;  Surgeon: Newt Minion, MD;  Location: [place];  Service: Orthopedics;  Laterality: Right;  . APPLICATION OF WOUND VAC Right 01/23/2019   Procedure: Application Of  Prevena Wound Vac;  Surgeon: Newt Minion, MD;  Location: [place];  Service: Orthopedics;  Laterality: Right;  . BACK SURGERY    . EYE SURGERY     Bilateral Cataract surgery   . HERNIA REPAIR    . I & D EXTREMITY Right 05/10/2015   Procedure:  IRRIGATION AND DEBRIDEMENT EXTREMITY;  Surgeon: Leanora Cover, MD;  Location: [place];  Service: Orthopedics;  Laterality: Right;  . KNEE ARTHROPLASTY     right knee X 2; left knee once  . LAMINECTOMY     X 6  . LEG AMPUTATION BELOW KNEE Right 03/28/2018  . POSTERIOR CERVICAL FUSION/FORAMINOTOMY  01/28/2012   Procedure: POSTERIOR CERVICAL FUSION/FORAMINOTOMY LEVEL 3;  Surgeon: [name], MD;  Location: [place] [facility];  Service: Neurosurgery;  Laterality: Left;  Posterior Cervical Five-Thoracic One Fusion, Arthrodesis with LEFT Cervical Seven-thoracic One Laminectomy, Foraminotomy and Resection of Synovial Cyst  . POSTERIOR CERVICAL FUSION/FORAMINOTOMY N/A 01/29/2013   Procedure: POSTERIOR CERVICAL FUSION/FORAMINOTOMY LEVEL 1 and C2-5 Posteriolateral Arthrodesis;  Surgeon: [name], MD;  Location: [place] [facility];  Service: Neurosurgery;  Laterality: N/A;  C2-C3 Laminectomy C2-C3 posterior cervical arthrodesis  . STUMP REVISION Right 01/23/2019   Procedure: REVISION RIGHT BELOW KNEE AMPUTATION;  Surgeon: Newt Minion, MD;  Location: [place];  Service: Orthopedics;  Laterality: Right;  . TONSILLECTOMY      There were no vitals filed for this visit.   Subjective Assessment - 10/26/19 1348    Subjective His wife reports she found another area on back of left leg seeping fluid  last night. They have an appointment with Dondra Prader, PA tomorrow and plan to discuss it.    Patient is accompained by: Family member   wife - [name]   Pertinent History Rt TTA, C2-5 Cervical Fusion 2014, Tachypnea, encephalopathy, RLE paralysis, TIA, A-Fib, CAD, CHF, CKD stage 3, Sz disorder, hx of vertigo, knee arthroplasty Rt X 2 & Lt X 1    Limitations Lifting;Standing;Walking;House hold activities    Patient Stated Goals To get prosthesis, go to bathroom alone, get around house, get out in community.    Currently in Pain? Yes    Pain Score 4     Pain Location Other (Comment)   all over  arthritis   Pain Orientation Right;Left    Pain Descriptors / Indicators Aching;Sore    Pain Type Chronic pain    Pain Onset More than a month ago    Pain Frequency Constant    Aggravating Factors  stiffness and increased activities    Pain Relieving Factors medication and rest but repositioning when sore.    Pain Onset More than a month ago                             [facility] Adult PT Treatment/Exercise - 10/26/19 1348      Transfers   Transfers Lateral/Scoot Transfers    Squat Pivot Transfers --    Lateral/Scoot Transfers 3: Mod assist;2: Max assist   MaxA to [employer] 4" higher & ModA to w/c 4" lower     Exercises   Other Exercises  seated push ups from arm rests on nu step seat 2 sets 5 reps      Knee/Hip Exercises: Aerobic   Nustep Level 6 with BUEs and Lt LE 10 minutes       Knee/Hip Exercises: Machines for Strengthening   Cybex Leg Press --      Knee/Hip Exercises: Seated   Long Arc Quad Right;2 sets;10 reps;Left    Long Arc Quad Limitations red theraband resistance    Other Seated Knee/Hip Exercises stool scoots using hamstrings 25' and backwards with quads 25'    Other Seated Knee/Hip Exercises Right hip internal rotation AAROM 5 reps 2 sets    Hamstring Curl Strengthening;Right;10 reps;2 sets    Hamstring Limitations red theraband resistance    Abduction/Adduction  AROM;Strengthening;Right;Left;2 sets;10 reps      Prosthetics   Residual limb condition  residual limb has 2 wounds with red granulated bed. No signs of infection.                     PT Short Term Goals - 10/08/19 1926      PT SHORT TERM GOAL #1   Title Patient & wife verbalize general understanding of adjusting ply socks with limb volume changes.    (All Target STGs: 10/08/2019)    Baseline NOT MET 10/08/2019    Time 1    Period Months    Status Not Met    Target Date 10/08/19      PT SHORT TERM GOAL #2   Title Sit to / from stand w/c to RW with wife's assistance and  maintains upright for 2 minutes with RW support with minA.    Baseline NOT MET 10/08/2019    Time 1    Period Months    Status Not Met    Target Date 10/08/19      PT SHORT TERM GOAL #3  Title Stand pivot transfer with RW with modA    Baseline NOT MET 10/08/2019    Time 1    Period Months    Status Not Met    Target Date 10/08/19      PT SHORT TERM GOAL #4   Title Patient tolerates prosthesis wear >12 hours total /day without wound issues.    Baseline NOT MET 10/08/2019    Time 1    Period Months    Status On-going    Target Date 10/08/19      PT SHORT TERM GOAL #5   Title Patient ambulates 5' with RW & prosthesis with modA.    Baseline NOT MET 10/08/2019    Time 1    Period Months    Status On-going    Target Date 10/08/19             PT Long Term Goals - 08/06/19 2236      PT LONG TERM GOAL #1   Title Patient and wife verbalize & demonstrate understanding of proper prosthetic care & patient able to donne prosthesis modified independent to enable safe utilization of prosthesis. (All LTGs Target Date: 11/04/2019)    Time 3    Period Months    Status On-going    Target Date 11/04/19      PT LONG TERM GOAL #2   Title Patient tolerates wear of prosthesis >90% of awake hours without skin or residual limb pain issues to enable function during his day.    Time 3    Period Months    Status On-going    Target Date 11/04/19      PT LONG TERM GOAL #3   Title Standing balance with RW support: maintains upright 2 minutes, reaches 5" anteriorly and scans environment with supervision.    Time 3    Period Months    Status On-going    Target Date 11/04/19      PT LONG TERM GOAL #4   Title Patient ambulates 100' with RW & prosthesis with wife's assistance / supervision and turns 180* to position to sit safely.    Time 3    Period Months    Status On-going    Target Date 11/04/19      PT LONG TERM GOAL #5   Title Sit to/from stand and stand-pivot transfers with RW &  prosthesis with supervision.    Time 3    Period Months    Status On-going    Target Date 11/04/19                 Plan - 10/26/19 1358    Clinical Impression Statement PT session focusing on strengthening without prosthesis while residual limb is healing.  His wounds appear to be healing slowly.    Personal Factors and Comorbidities Age;Comorbidity 3+;Fitness;Past/Current Experience;Time since onset of injury/illness/exacerbation    Comorbidities Rt TTA, C2-5 Cervical Fusion 2014, Tachypnea, encephalopathy, RLE paralysis, TIA, A-Fib, CAD, CHF, CKD stage 3, Sz disorder, hx of vertigo, knee arthroplasty Rt X 2 & Lt X 1    Examination-Activity Limitations Caring for Others;Locomotion Level;Reach Overhead;Squat;Stairs;Stand;Transfers    Examination-Participation Restrictions Community Activity    Stability/Clinical Decision Making Evolving/Moderate complexity    Rehab Potential Good    PT Frequency 2x / week    PT Duration Other (comment)   13 weeks (90 days)   PT Treatment/Interventions ADLs/Self Care Home Management;DME Instruction;Gait training;Stair training;Functional mobility training;Therapeutic activities;Therapeutic exercise;Balance training;Neuromuscular re-education;Patient/family education;Prosthetic Training;Passive range of motion;Vestibular  PT Next Visit Plan how does wound look. Nustep & leg press exercise, sitting exercises without back support    Consulted and Agree with Plan of Care Patient;Family member/caregiver    Family Member Consulted wife, Zebulun Deman           Patient will benefit from skilled therapeutic intervention in order to improve the following deficits and impairments:  Abnormal gait, Cardiopulmonary status limiting activity, Decreased activity tolerance, Decreased balance, Decreased endurance, Decreased knowledge of use of DME, Decreased mobility, Decreased range of motion, Decreased skin integrity, Decreased strength, Difficulty walking,  Increased edema, Impaired flexibility, Postural dysfunction, Prosthetic Dependency, Pain  Visit Diagnosis: Unsteadiness on feet  Other abnormalities of gait and mobility  Abnormal posture  Stiffness of right knee, not elsewhere classified  Muscle weakness (generalized)  History of falling  Contracture of muscle, multiple sites  Cervicalgia     Problem List Patient Active Problem List   Diagnosis Date Noted  . Permanent atrial fibrillation ([place]) 10/24/2019  . Hypercholesterolemia 10/24/2019  . Current chronic use of systemic steroids 10/24/2019  . Macrocytic anemia 10/24/2019  . Dehiscence of amputation stump ([place])   . Wound infection 05/09/2018  . AKI (acute kidney injury) ([place])   . S/P BKA (below knee amputation) unilateral, right ([place])   . Below-knee amputation with complication, initial encounter ([place])   . Tachypnea   . Post-operative pain   . Subacute osteomyelitis, right ankle and foot ([place])   . Paralysis of right lower extremity ([place])   . TIA (transient ischemic attack) 03/24/2018  . Encephalopathy 03/24/2018  . Cellulitis 03/15/2018  . Hypernatremia 03/15/2018  . Persistent atrial fibrillation ([place]) 11/10/2017  . Severe muscle deconditioning 11/10/2017  . Hemorrhage 10/22/2017  . Coronary artery disease involving native coronary artery of native heart without angina pectoris 10/22/2017  . Acute hypoxemic respiratory failure ([place]) 07/17/2017  . Aspiration syndrome, subsequent encounter 07/11/2017  . Aspiration pneumonia ([place]) 07/01/2017  . Acute encephalopathy 07/01/2017  . Thrombocytopenia ([place]) 07/01/2017  . Chronic diastolic (congestive) heart failure ([place]) 07/01/2017  . Anemia of chronic disease 07/01/2017  . Troponin level elevated 07/01/2017  . Chronic atrial fibrillation 07/01/2017  . Goals of care, counseling/discussion   . Palliative care by specialist   . Acute metabolic encephalopathy 68/05/2120  . Stage 3a chronic kidney disease   . CAP  (community acquired pneumonia) 04/17/2017  . Hallux rigidus, right foot 03/10/2016  . Lumbosacral spondylosis without myelopathy 10/29/2015  . Memory difficulty 09/22/2015  . Abnormality of gait 09/22/2015  . Hyperglycemia 07/06/2015  . Chronic pain 07/06/2015  . Chronic insomnia 03/30/2015  . Transient alteration of awareness 03/30/2015  . Abnormal liver function   . Altered mental status 01/31/2015  . Essential hypertension 01/31/2015  . Constipation 01/31/2015  . Hypothyroidism 01/31/2015  . Seizure disorder ([place]) 01/31/2015  . Bladder outlet obstruction 01/31/2015  . GERD (gastroesophageal reflux disease) 01/31/2015  . Chronic back pain 01/31/2015  . Acute kidney injury ([place]) 01/31/2015    Jamey Reas  PT, DPT 10/26/2019, 4:03 PM  [facility] Physical Therapy [address] [place], [place], [address] Phone: [phone]   Fax:  [phone]  Name: [name] MRN: [id number] Date of Birth: January 13, 1934

## 2019-10-27 ENCOUNTER — Ambulatory Visit (INDEPENDENT_AMBULATORY_CARE_PROVIDER_SITE_OTHER): Payer: Medicare Other | Admitting: Physician Assistant

## 2019-10-27 ENCOUNTER — Encounter: Payer: Self-pay | Admitting: Physician Assistant

## 2019-10-27 VITALS — Ht 69.0 in | Wt 155.0 lb

## 2019-10-27 DIAGNOSIS — M86271 Subacute osteomyelitis, right ankle and foot: Secondary | ICD-10-CM

## 2019-10-27 NOTE — Progress Notes (Signed)
Office Visit Note   Patient: [name]           Date of Birth: 02-24-1934           MRN: [id number] Visit Date: 10/27/2019              Requested by: [name], [place],  [place] [address] PCP: [name], MD  Chief Complaint  Patient presents with  . Right Leg - Follow-up    01/23/19 revision right BKA      HPI: This is a pleasant gentleman who is 9 months status post revision right below-knee amputation.  He was doing well was working with physical therapy and he had a abrasion at the end of his amputation stump.  He has been refraining from wearing the amputation stump.  His wife is concerned because he has had ongoing sharp pain at the end of his stump.  Difficult to get a timeline on when this began but she and he states it has been happening on and off.  Happens mostly at night.  Also she is complaining of some intermittent drainage from areas on his left lower extremity.  She is covering this with a Band-Aid and bandages and then compression sock over it No  Assessment & Plan: Visit Diagnoses: No diagnosis found.  Plan: Continue with shrinker on the right.  With regards to the left side I have asked that she put the compression stocking against the skin and not place Band-Aids over it first.  He has improved with regards to the abrasion on the right.  There is no sign of infection.  He will follow-up in 2 weeks for reevaluation  Follow-Up Instructions: No follow-ups on file.   Ortho Exam  Patient is alert, oriented, no adenopathy, well-dressed, normal affect, normal respiratory effort. Right below-knee amputation stump: Full knee range of motion.  No erythema no drainage no cellulitis no warmth.  He does have a small eschar on the lateral side of the stump which is quite thin there is no surrounding cellulitis or drainage from this.  Left lower extremity he does have some venous stasis changes in the lower extremity.  On the posterior  aspect he has 2 small areas of serous drainage.  They are not deep.  No cellulitis or signs of infection  Imaging: No results found. No images are attached to the encounter.  Labs: Lab Results  Component Value Date   HGBA1C 5.7 (H) 03/26/2018   HGBA1C 5.0 07/12/2017   HGBA1C 5.8 (H) 07/06/2015   ESRSEDRATE 24 (H) 03/24/2018   ESRSEDRATE 15 04/18/2017   ESRSEDRATE 5 09/17/2007   CRP 3.1 (H) 05/11/2018   CRP 1.8 (H) 03/24/2018   CRP <0.8 04/24/2017   LABURIC 6.0 03/09/2016   REPTSTATUS 07/12/2019 FINAL 07/10/2019   GRAMSTAIN  07/15/2017    WBC PRESENT,BOTH PMN AND MONONUCLEAR NO ORGANISMS SEEN Performed at [facility], [place] [address], [place], [place] [address]    CULT >=100,000 COLONIES/mL ESCHERICHIA COLI (A) 07/10/2019   LABORGA ESCHERICHIA COLI (A) 07/10/2019     Lab Results  Component Value Date   ALBUMIN 3.6 10/16/2019   ALBUMIN 3.6 12/20/2018   ALBUMIN 3.0 (L) 05/09/2018   PREALBUMIN 36 (H) 11/22/2017   PREALBUMIN 28.8 04/24/2017   LABURIC 6.0 03/09/2016    Lab Results  Component Value Date   MG 2.0 05/12/2018   MG 1.9 05/11/2018   MG 1.9 03/31/2018   No results found for: [facility]  Lab Results  Component Value Date   PREALBUMIN 36 (H) 11/22/2017   PREALBUMIN 28.8 04/24/2017   CBC EXTENDED Latest Ref Rng & Units 10/16/2019 07/10/2019 01/23/2019  WBC 3.4 - 10.8 x10E3/uL 6.3 8.5 7.3  RBC 4.14 - 5.80 x10E6/uL 3.27(L) 3.51(L) 3.80(L)  HGB 13.0 - 17.7 g/dL 11.0(L) 10.9(L) 12.4(L)  HCT 37.5 - 51.0 % 33.0(L) 35.5(L) 41.2  PLT 150 - 450 x10E3/uL 95(LL) 117(L) 135(L)  NEUTROABS 1.7 - 7.7 K/uL - 6.2 -  LYMPHSABS 0.7 - 4.0 K/uL - 1.1 -     Body mass index is 22.89 kg/m.  Orders:  No orders of the defined types were placed in this encounter.  No orders of the defined types were placed in this encounter.    Procedures: No procedures performed  Clinical Data: No additional findings.  ROS:  All other systems negative, except as noted in the  HPI. Review of Systems  Objective: Vital Signs: Ht 5\' 9"  (1.753 m)   Wt 155 lb (70.3 kg)   BMI 22.89 kg/m   Specialty Comments:  No specialty comments available.  PMFS History: Patient Active Problem List   Diagnosis Date Noted  . Permanent atrial fibrillation ([place]) 10/24/2019  . Hypercholesterolemia 10/24/2019  . Current chronic use of systemic steroids 10/24/2019  . Macrocytic anemia 10/24/2019  . Dehiscence of amputation stump ([place])   . Wound infection 05/09/2018  . AKI (acute kidney injury) ([place])   . S/P BKA (below knee amputation) unilateral, right ([place])   . Below-knee amputation with complication, initial encounter ([place])   . Tachypnea   . Post-operative pain   . Subacute osteomyelitis, right ankle and foot ([place])   . Paralysis of right lower extremity ([place])   . TIA (transient ischemic attack) 03/24/2018  . Encephalopathy 03/24/2018  . Cellulitis 03/15/2018  . Hypernatremia 03/15/2018  . Persistent atrial fibrillation ([place]) 11/10/2017  . Severe muscle deconditioning 11/10/2017  . Hemorrhage 10/22/2017  . Coronary artery disease involving native coronary artery of native heart without angina pectoris 10/22/2017  . Acute hypoxemic respiratory failure ([place]) 07/17/2017  . Aspiration syndrome, subsequent encounter 07/11/2017  . Aspiration pneumonia ([place]) 07/01/2017  . Acute encephalopathy 07/01/2017  . Thrombocytopenia ([place]) 07/01/2017  . Chronic diastolic (congestive) heart failure ([place]) 07/01/2017  . Anemia of chronic disease 07/01/2017  . Troponin level elevated 07/01/2017  . Chronic atrial fibrillation 07/01/2017  . Goals of care, counseling/discussion   . Palliative care by specialist   . Acute metabolic encephalopathy 34/19/3790  . Stage 3a chronic kidney disease   . CAP (community acquired pneumonia) 04/17/2017  . Hallux rigidus, right foot 03/10/2016  . Lumbosacral spondylosis without myelopathy 10/29/2015  . Memory difficulty 09/22/2015  . Abnormality of  gait 09/22/2015  . Hyperglycemia 07/06/2015  . Chronic pain 07/06/2015  . Chronic insomnia 03/30/2015  . Transient alteration of awareness 03/30/2015  . Abnormal liver function   . Altered mental status 01/31/2015  . Essential hypertension 01/31/2015  . Constipation 01/31/2015  . Hypothyroidism 01/31/2015  . Seizure disorder ([place]) 01/31/2015  . Bladder outlet obstruction 01/31/2015  . GERD (gastroesophageal reflux disease) 01/31/2015  . Chronic back pain 01/31/2015  . Acute kidney injury ([place]) 01/31/2015   Past Medical History:  Diagnosis Date  . Abnormality of gait 09/22/2015  . Arthritis   . Atrial fibrillation ([place])   . CAD (coronary artery disease)    Stent to RCA, Penta stent, 99% reduced to 0% 2002.  . Cancer ([place])    skin CA removed from back  .  Chronic insomnia 03/30/2015  . Complication of anesthesia    trouble waking up  . GERD (gastroesophageal reflux disease)   . Hypercholesteremia   . Hypertension   . Hypothyroidism   . Memory difficulty 09/22/2015  . Osteoarthritis   . Pneumonia   . Seizures ([place])   . Sepsis ([place]) 05/2017  . Transient alteration of awareness 03/30/2015  . Vertigo    hx of    Family History  Problem Relation Age of Onset  . Hypertension Mother   . Cancer Mother   . Kidney failure Father   . Heart disease Father     Past Surgical History:  Procedure Laterality Date  . AMPUTATION Right 03/28/2018   Procedure: AMPUTATION BELOW KNEE;  Surgeon: Newt Minion, MD;  Location: [place];  Service: Orthopedics;  Laterality: Right;  . APPLICATION OF WOUND VAC Right 01/23/2019   Procedure: Application Of  Prevena Wound Vac;  Surgeon: Newt Minion, MD;  Location: [place];  Service: Orthopedics;  Laterality: Right;  . BACK SURGERY    . EYE SURGERY     Bilateral Cataract surgery   . HERNIA REPAIR    . I & D EXTREMITY Right 05/10/2015   Procedure: IRRIGATION AND DEBRIDEMENT EXTREMITY;  Surgeon: Leanora Cover, MD;  Location: [place];   Service: Orthopedics;  Laterality: Right;  . KNEE ARTHROPLASTY     right knee X 2; left knee once  . LAMINECTOMY     X 6  . LEG AMPUTATION BELOW KNEE Right 03/28/2018  . POSTERIOR CERVICAL FUSION/FORAMINOTOMY  01/28/2012   Procedure: POSTERIOR CERVICAL FUSION/FORAMINOTOMY LEVEL 3;  Surgeon: [name], MD;  Location: [place] [facility];  Service: Neurosurgery;  Laterality: Left;  Posterior Cervical Five-Thoracic One Fusion, Arthrodesis with LEFT Cervical Seven-thoracic One Laminectomy, Foraminotomy and Resection of Synovial Cyst  . POSTERIOR CERVICAL FUSION/FORAMINOTOMY N/A 01/29/2013   Procedure: POSTERIOR CERVICAL FUSION/FORAMINOTOMY LEVEL 1 and C2-5 Posteriolateral Arthrodesis;  Surgeon: [name], MD;  Location: [place] [facility];  Service: Neurosurgery;  Laterality: N/A;  C2-C3 Laminectomy C2-C3 posterior cervical arthrodesis  . STUMP REVISION Right 01/23/2019   Procedure: REVISION RIGHT BELOW KNEE AMPUTATION;  Surgeon: Newt Minion, MD;  Location: [place];  Service: Orthopedics;  Laterality: Right;  . TONSILLECTOMY     Social History   Occupational History  . Occupation: retired Software engineer  Tobacco Use  . Smoking status: Former Research scientist (life sciences)  . Smokeless tobacco: Never Used  Vaping Use  . Vaping Use: Never used  Substance and Sexual Activity  . Alcohol use: Not Currently    Comment: rare  . Drug use: No  . Sexual activity: Not Currently

## 2019-10-28 ENCOUNTER — Encounter: Payer: Medicare Other | Admitting: Physical Therapy

## 2019-10-28 ENCOUNTER — Ambulatory Visit (INDEPENDENT_AMBULATORY_CARE_PROVIDER_SITE_OTHER): Payer: Medicare Other | Admitting: Physical Therapy

## 2019-10-28 ENCOUNTER — Encounter: Payer: Self-pay | Admitting: Physical Therapy

## 2019-10-28 ENCOUNTER — Other Ambulatory Visit: Payer: Self-pay

## 2019-10-28 DIAGNOSIS — R2689 Other abnormalities of gait and mobility: Secondary | ICD-10-CM | POA: Diagnosis not present

## 2019-10-28 DIAGNOSIS — R293 Abnormal posture: Secondary | ICD-10-CM

## 2019-10-28 DIAGNOSIS — M6249 Contracture of muscle, multiple sites: Secondary | ICD-10-CM

## 2019-10-28 DIAGNOSIS — M25661 Stiffness of right knee, not elsewhere classified: Secondary | ICD-10-CM | POA: Diagnosis not present

## 2019-10-28 DIAGNOSIS — Z9181 History of falling: Secondary | ICD-10-CM

## 2019-10-28 DIAGNOSIS — R2681 Unsteadiness on feet: Secondary | ICD-10-CM

## 2019-10-28 DIAGNOSIS — M6281 Muscle weakness (generalized): Secondary | ICD-10-CM | POA: Diagnosis not present

## 2019-10-28 DIAGNOSIS — M542 Cervicalgia: Secondary | ICD-10-CM

## 2019-10-28 NOTE — Therapy (Signed)
[facility] [address] [place], [place], [address] Phone: [phone]   Fax:  [phone]  Physical Therapy Treatment  Patient Details  Name: [name] MRN: [id number] Date of Birth: May 28, 1933 Referring Provider (PT): Bevely Palmer Persons, [place]   Encounter Date: 10/28/2019   PT End of Session - 10/28/19 1612    Visit Number 38    Number of Visits 39    Date for PT Re-Evaluation 11/04/19    Authorization Type BCBS & Medicare    PT Start Time 1605    PT Stop Time 1645    PT Time Calculation (min) 40 min    Equipment Utilized During Treatment Gait belt    Activity Tolerance Patient tolerated treatment well;Patient limited by fatigue    Behavior During Therapy [facility] for tasks assessed/performed           Past Medical History:  Diagnosis Date   Abnormality of gait 09/22/2015   Arthritis    Atrial fibrillation ([place])    CAD (coronary artery disease)    Stent to RCA, Penta stent, 99% reduced to 0% 2002.   Cancer ([place])    skin CA removed from back   Chronic insomnia 9/52/8413   Complication of anesthesia    trouble waking up   GERD (gastroesophageal reflux disease)    Hypercholesteremia    Hypertension    Hypothyroidism    Memory difficulty 09/22/2015   Osteoarthritis    Pneumonia    Seizures ([place])    Sepsis ([place]) 05/2017   Transient alteration of awareness 03/30/2015   Vertigo    hx of    Past Surgical History:  Procedure Laterality Date   AMPUTATION Right 03/28/2018   Procedure: AMPUTATION BELOW KNEE;  Surgeon: Newt Minion, MD;  Location: [place];  Service: Orthopedics;  Laterality: Right;   APPLICATION OF WOUND VAC Right 01/23/2019   Procedure: Application Of  Prevena Wound Vac;  Surgeon: Newt Minion, MD;  Location: [place];  Service: Orthopedics;  Laterality: Right;   BACK SURGERY     EYE SURGERY     Bilateral Cataract surgery    HERNIA REPAIR     I & D EXTREMITY Right 05/10/2015   Procedure:  IRRIGATION AND DEBRIDEMENT EXTREMITY;  Surgeon: Leanora Cover, MD;  Location: [place];  Service: Orthopedics;  Laterality: Right;   KNEE ARTHROPLASTY     right knee X 2; left knee once   LAMINECTOMY     X 6   LEG AMPUTATION BELOW KNEE Right 03/28/2018   POSTERIOR CERVICAL FUSION/FORAMINOTOMY  01/28/2012   Procedure: POSTERIOR CERVICAL FUSION/FORAMINOTOMY LEVEL 3;  Surgeon: [name], MD;  Location: [place] [facility];  Service: Neurosurgery;  Laterality: Left;  Posterior Cervical Five-Thoracic One Fusion, Arthrodesis with LEFT Cervical Seven-thoracic One Laminectomy, Foraminotomy and Resection of Synovial Cyst   POSTERIOR CERVICAL FUSION/FORAMINOTOMY N/A 01/29/2013   Procedure: POSTERIOR CERVICAL FUSION/FORAMINOTOMY LEVEL 1 and C2-5 Posteriolateral Arthrodesis;  Surgeon: [name], MD;  Location: [place] [facility];  Service: Neurosurgery;  Laterality: N/A;  C2-C3 Laminectomy C2-C3 posterior cervical arthrodesis   STUMP REVISION Right 01/23/2019   Procedure: REVISION RIGHT BELOW KNEE AMPUTATION;  Surgeon: Newt Minion, MD;  Location: [place];  Service: Orthopedics;  Laterality: Right;   TONSILLECTOMY      There were no vitals filed for this visit.   Subjective Assessment - 10/28/19 1605    Subjective They saw Bevely Palmer, PA for Dr. [name]. Wife is frustrated with no known  reason for wounds on left leg and concern that sock will stay wet with wounds ozzing.    Patient is accompained by: Family member   wife - [name]   Pertinent History Rt TTA, C2-5 Cervical Fusion 2014, Tachypnea, encephalopathy, RLE paralysis, TIA, A-Fib, CAD, CHF, CKD stage 3, Sz disorder, hx of vertigo, knee arthroplasty Rt X 2 & Lt X 1    Limitations Lifting;Standing;Walking;House hold activities    Patient Stated Goals To get prosthesis, go to bathroom alone, get around house, get out in community.    Currently in Pain? Yes    Pain Score 6     Pain Location Other (Comment)   all over arthritis  pain.   Pain Orientation Right;Left    Pain Descriptors / Indicators Aching;Sore    Pain Type Chronic pain    Pain Onset More than a month ago    Pain Frequency Constant    Aggravating Factors  arthritis - stiffness or increased activity    Pain Relieving Factors medications    Pain Onset More than a month ago                             [facility] Adult PT Treatment/Exercise - 10/28/19 1605      Transfers   Transfers Lateral/Scoot Transfers    Lateral/Scoot Transfers 2: Max assist;3: Mod assist   MaxA to [employer] 4" higher & ModA to w/c 4" lower     Exercises   Other Exercises  seated push ups from arm rests on nu step seat 2 sets 5 reps      Knee/Hip Exercises: Aerobic   Nustep Level 5 with BUEs and Lt LE 10 minutes       Knee/Hip Exercises: Machines for Strengthening   Cybex Leg Press shuttle leg press 37# 2 sets X15 reps      Knee/Hip Exercises: Seated   Long Arc Quad Right;2 sets;10 reps;Left    Long Arc Quad Limitations red theraband resistance    Other Seated Knee/Hip Exercises stool scoots using hamstrings 25' and backwards with quads 25'    Other Seated Knee/Hip Exercises Right hip internal rotation AAROM 5 reps 2 sets    Hamstring Curl Strengthening;Right;10 reps;2 sets    Hamstring Limitations red theraband resistance    Abduction/Adduction  AROM;Strengthening;Right;Left;2 sets;10 reps      Prosthetics   Residual limb condition  residual limb has 2 wounds with red granulated bed. No signs of infection.                     PT Short Term Goals - 10/08/19 1926      PT SHORT TERM GOAL #1   Title Patient & wife verbalize general understanding of adjusting ply socks with limb volume changes.    (All Target STGs: 10/08/2019)    Baseline NOT MET 10/08/2019    Time 1    Period Months    Status Not Met    Target Date 10/08/19      PT SHORT TERM GOAL #2   Title Sit to / from stand w/c to RW with wife's assistance and maintains upright for 2  minutes with RW support with minA.    Baseline NOT MET 10/08/2019    Time 1    Period Months    Status Not Met    Target Date 10/08/19      PT SHORT TERM GOAL #3   Title Stand pivot  transfer with RW with modA    Baseline NOT MET 10/08/2019    Time 1    Period Months    Status Not Met    Target Date 10/08/19      PT SHORT TERM GOAL #4   Title Patient tolerates prosthesis wear >12 hours total /day without wound issues.    Baseline NOT MET 10/08/2019    Time 1    Period Months    Status On-going    Target Date 10/08/19      PT SHORT TERM GOAL #5   Title Patient ambulates 48' with RW & prosthesis with modA.    Baseline NOT MET 10/08/2019    Time 1    Period Months    Status On-going    Target Date 10/08/19             PT Long Term Goals - 08/06/19 2236      PT LONG TERM GOAL #1   Title Patient and wife verbalize & demonstrate understanding of proper prosthetic care & patient able to donne prosthesis modified independent to enable safe utilization of prosthesis. (All LTGs Target Date: 11/04/2019)    Time 3    Period Months    Status On-going    Target Date 11/04/19      PT LONG TERM GOAL #2   Title Patient tolerates wear of prosthesis >90% of awake hours without skin or residual limb pain issues to enable function during his day.    Time 3    Period Months    Status On-going    Target Date 11/04/19      PT LONG TERM GOAL #3   Title Standing balance with RW support: maintains upright 2 minutes, reaches 5" anteriorly and scans environment with supervision.    Time 3    Period Months    Status On-going    Target Date 11/04/19      PT LONG TERM GOAL #4   Title Patient ambulates 100' with RW & prosthesis with wife's assistance / supervision and turns 180* to position to sit safely.    Time 3    Period Months    Status On-going    Target Date 11/04/19      PT LONG TERM GOAL #5   Title Sit to/from stand and stand-pivot transfers with RW & prosthesis with supervision.     Time 3    Period Months    Status On-going    Target Date 11/04/19                 Plan - 10/28/19 1649    Clinical Impression Statement Patient seemed more fatigued today with increased difficulty with exercises and activities.    Personal Factors and Comorbidities Age;Comorbidity 3+;Fitness;Past/Current Experience;Time since onset of injury/illness/exacerbation    Comorbidities Rt TTA, C2-5 Cervical Fusion 2014, Tachypnea, encephalopathy, RLE paralysis, TIA, A-Fib, CAD, CHF, CKD stage 3, Sz disorder, hx of vertigo, knee arthroplasty Rt X 2 & Lt X 1    Examination-Activity Limitations Caring for Others;Locomotion Level;Reach Overhead;Squat;Stairs;Stand;Transfers    Examination-Participation Restrictions Community Activity    Stability/Clinical Decision Making Evolving/Moderate complexity    Rehab Potential Good    PT Frequency 2x / week    PT Duration Other (comment)   13 weeks (90 days)   PT Treatment/Interventions ADLs/Self Care Home Management;DME Instruction;Gait training;Stair training;Functional mobility training;Therapeutic activities;Therapeutic exercise;Balance training;Neuromuscular re-education;Patient/family education;Prosthetic Training;Passive range of motion;Vestibular    PT Next Visit Plan how does wound look. Nustep &  leg press exercise, sitting exercises without back support    Consulted and Agree with Plan of Care Patient;Family member/caregiver    Family Member Consulted wife, Aharon Carriere           Patient will benefit from skilled therapeutic intervention in order to improve the following deficits and impairments:  Abnormal gait, Cardiopulmonary status limiting activity, Decreased activity tolerance, Decreased balance, Decreased endurance, Decreased knowledge of use of DME, Decreased mobility, Decreased range of motion, Decreased skin integrity, Decreased strength, Difficulty walking, Increased edema, Impaired flexibility, Postural dysfunction, Prosthetic  Dependency, Pain  Visit Diagnosis: Unsteadiness on feet  Other abnormalities of gait and mobility  Stiffness of right knee, not elsewhere classified  Muscle weakness (generalized)  Abnormal posture  History of falling  Contracture of muscle, multiple sites  Cervicalgia     Problem List Patient Active Problem List   Diagnosis Date Noted   Permanent atrial fibrillation ([place]) 10/24/2019   Hypercholesterolemia 10/24/2019   Current chronic use of systemic steroids 10/24/2019   Macrocytic anemia 10/24/2019   Dehiscence of amputation stump (H[facility])    Wound infection 05/09/2018   AKI (acute kidney injury) ([place])    S/P BKA (below knee amputation) unilateral, right ([place])    Below-knee amputation with complication, initial encounter ([place])    Tachypnea    Post-operative pain    Subacute osteomyelitis, right ankle and foot ([place])    Paralysis of right lower extremity ([place])    TIA (transient ischemic attack) 03/24/2018   Encephalopathy 03/24/2018   Cellulitis 03/15/2018   Hypernatremia 03/15/2018   Persistent atrial fibrillation ([place]) 11/10/2017   Severe muscle deconditioning 11/10/2017   Hemorrhage 10/22/2017   Coronary artery disease involving native coronary artery of native heart without angina pectoris 10/22/2017   Acute hypoxemic respiratory failure ([place]) 07/17/2017   Aspiration syndrome, subsequent encounter 07/11/2017   Aspiration pneumonia ([place]) 07/01/2017   Acute encephalopathy 07/01/2017   Thrombocytopenia ([place]) 07/01/2017   Chronic diastolic (congestive) heart failure ([place]) 07/01/2017   Anemia of chronic disease 07/01/2017   Troponin level elevated 07/01/2017   Chronic atrial fibrillation 07/01/2017   Goals of care, counseling/discussion    Palliative care by specialist    Acute metabolic encephalopathy 96/78/9381   Stage 3a chronic kidney disease    CAP (community acquired pneumonia) 04/17/2017   Hallux rigidus, right foot  03/10/2016   Lumbosacral spondylosis without myelopathy 10/29/2015   Memory difficulty 09/22/2015   Abnormality of gait 09/22/2015   Hyperglycemia 07/06/2015   Chronic pain 07/06/2015   Chronic insomnia 03/30/2015   Transient alteration of awareness 03/30/2015   Abnormal liver function    Altered mental status 01/31/2015   Essential hypertension 01/31/2015   Constipation 01/31/2015   Hypothyroidism 01/31/2015   Seizure disorder ([place]) 01/31/2015   Bladder outlet obstruction 01/31/2015   GERD (gastroesophageal reflux disease) 01/31/2015   Chronic back pain 01/31/2015   Acute kidney injury ([place]) 01/31/2015    Jamey Reas PT, DPT 10/28/2019, 4:52 PM  [facility] [address] [place], [place], [address] Phone: [phone]   Fax:  [phone]  Name: [name] MRN: [id number] Date of Birth: [birth year]

## 2019-11-02 ENCOUNTER — Other Ambulatory Visit: Payer: Self-pay

## 2019-11-02 ENCOUNTER — Encounter: Payer: Self-pay | Admitting: Physical Therapy

## 2019-11-02 ENCOUNTER — Ambulatory Visit (INDEPENDENT_AMBULATORY_CARE_PROVIDER_SITE_OTHER): Payer: Medicare Other | Admitting: Physical Therapy

## 2019-11-02 DIAGNOSIS — R293 Abnormal posture: Secondary | ICD-10-CM

## 2019-11-02 DIAGNOSIS — M6249 Contracture of muscle, multiple sites: Secondary | ICD-10-CM

## 2019-11-02 DIAGNOSIS — M6281 Muscle weakness (generalized): Secondary | ICD-10-CM | POA: Diagnosis not present

## 2019-11-02 DIAGNOSIS — M25661 Stiffness of right knee, not elsewhere classified: Secondary | ICD-10-CM | POA: Diagnosis not present

## 2019-11-02 DIAGNOSIS — M542 Cervicalgia: Secondary | ICD-10-CM

## 2019-11-02 DIAGNOSIS — R2681 Unsteadiness on feet: Secondary | ICD-10-CM | POA: Diagnosis not present

## 2019-11-02 DIAGNOSIS — R2689 Other abnormalities of gait and mobility: Secondary | ICD-10-CM

## 2019-11-02 DIAGNOSIS — Z9181 History of falling: Secondary | ICD-10-CM

## 2019-11-02 NOTE — Therapy (Signed)
[place], [place], [address] Phone: [phone]   Fax:  [phone]  Physical Therapy Treatment  Patient Details  Name: [name] MRN: [id number] Date of Birth: 1933/12/10 Referring Provider (PT): Bevely Palmer Persons, [place]   Encounter Date: 11/02/2019   PT End of Session - 11/02/19 1407    Visit Number 39    Number of Visits 48    Date for PT Re-Evaluation 11/04/19    Authorization Type BCBS & Medicare    PT Start Time 1350    PT Stop Time 1432    PT Time Calculation (min) 42 min    Equipment Utilized During Treatment Gait belt    Activity Tolerance Patient tolerated treatment well;Patient limited by fatigue    Behavior During Therapy [facility] for tasks assessed/performed           Past Medical History:  Diagnosis Date  . Abnormality of gait 09/22/2015  . Arthritis   . Atrial fibrillation ([facility])   . CAD (coronary artery disease)    Stent to RCA, Penta stent, 99% reduced to 0% 2002.  . Cancer ([place])    skin CA removed from back  . Chronic insomnia 03/30/2015  . Complication of anesthesia    trouble waking up  . GERD (gastroesophageal reflux disease)   . Hypercholesteremia   . Hypertension   . Hypothyroidism   . Memory difficulty 09/22/2015  . Osteoarthritis   . Pneumonia   . Seizures ([place])   . Sepsis ([place]) 05/2017  . Transient alteration of awareness 03/30/2015  . Vertigo    hx of    Past Surgical History:  Procedure Laterality Date  . AMPUTATION Right 03/28/2018   Procedure: AMPUTATION BELOW KNEE;  Surgeon: Newt Minion, MD;  Location: [place];  Service: Orthopedics;  Laterality: Right;  . APPLICATION OF WOUND VAC Right 01/23/2019   Procedure: Application Of  Prevena Wound Vac;  Surgeon: Newt Minion, MD;  Location: [place];  Service: Orthopedics;  Laterality: Right;  . BACK SURGERY    . EYE SURGERY     Bilateral Cataract surgery   . HERNIA REPAIR    . I & D EXTREMITY Right 05/10/2015   Procedure:  IRRIGATION AND DEBRIDEMENT EXTREMITY;  Surgeon: Leanora Cover, MD;  Location: [place];  Service: Orthopedics;  Laterality: Right;  . KNEE ARTHROPLASTY     right knee X 2; left knee once  . LAMINECTOMY     X 6  . LEG AMPUTATION BELOW KNEE Right 03/28/2018  . POSTERIOR CERVICAL FUSION/FORAMINOTOMY  01/28/2012   Procedure: POSTERIOR CERVICAL FUSION/FORAMINOTOMY LEVEL 3;  Surgeon: [name], MD;  Location: [place] [facility];  Service: Neurosurgery;  Laterality: Left;  Posterior Cervical Five-Thoracic One Fusion, Arthrodesis with LEFT Cervical Seven-thoracic One Laminectomy, Foraminotomy and Resection of Synovial Cyst  . POSTERIOR CERVICAL FUSION/FORAMINOTOMY N/A 01/29/2013   Procedure: POSTERIOR CERVICAL FUSION/FORAMINOTOMY LEVEL 1 and C2-5 Posteriolateral Arthrodesis;  Surgeon: [name], MD;  Location: [place] [facility];  Service: Neurosurgery;  Laterality: N/A;  C2-C3 Laminectomy C2-C3 posterior cervical arthrodesis  . STUMP REVISION Right 01/23/2019   Procedure: REVISION RIGHT BELOW KNEE AMPUTATION;  Surgeon: Newt Minion, MD;  Location: [place];  Service: Orthopedics;  Laterality: Right;  . TONSILLECTOMY      There were no vitals filed for this visit.   Subjective Assessment - 11/02/19 1350    Subjective Wife noticed two new wounds on residual limb over the weekend with no known  cause. His left leg continues to "ooze" soaking bed sheets.  Wife thought the wound was closing up but then reopened. It appears to be a superficial slit in calf area.  She set up appointment with PCP Dr. Sharlett Iles.    Patient is accompained by: Family member   wife - [name]   Pertinent History Rt TTA, C2-5 Cervical Fusion 2014, Tachypnea, encephalopathy, RLE paralysis, TIA, A-Fib, CAD, CHF, CKD stage 3, Sz disorder, hx of vertigo, knee arthroplasty Rt X 2 & Lt X 1    Limitations Lifting;Standing;Walking;House hold activities    Patient Stated Goals To get prosthesis, go to bathroom alone, get  around house, get out in community.    Currently in Pain? Yes    Pain Score 3     Pain Location Other (Comment)   all over arthritis   Pain Orientation Right;Left    Pain Descriptors / Indicators Aching;Sore    Pain Type Chronic pain    Pain Onset More than a month ago    Pain Frequency Constant    Aggravating Factors  stiffness with positioning & over doing it with activities.    Pain Relieving Factors medications and repositioning    Pain Onset More than a month ago                             [facility] Adult PT Treatment/Exercise - 11/02/19 1350      Transfers   Transfers Lateral/Scoot Transfers    Lateral/Scoot Transfers 2: Max assist;3: Mod assist   MaxA to [employer] 4" higher & ModA to w/c 4" lower     Exercises   Other Exercises  seated push ups from arm rests on Nustep seat 2 sets 5 reps      Knee/Hip Exercises: Aerobic   Nustep Level 5 with BUEs and Lt LE 10 minutes       Knee/Hip Exercises: Machines for Strengthening   Cybex Leg Press --      Knee/Hip Exercises: Seated   Long Arc Quad Right;2 sets;10 reps;Left    Long Arc Quad Limitations red theraband resistance RLE    Other Seated Knee/Hip Exercises --    Other Seated Knee/Hip Exercises Right hip internal rotation AAROM 5 reps 2 sets    Hamstring Curl Strengthening;Right;10 reps;2 sets    Hamstring Limitations red theraband resistance RLE    Abduction/Adduction  AROM;Strengthening;Right;Left;2 sets;10 reps      Prosthetics   Prosthetic Care Comments  continue with ViveWear shrinker & compression wrap wear.      Residual limb condition  right residual limb has 4 wounds: (laterally 19X39m & 13X139mwith dry bloody scab,  just distal & posterior is scratch 1039m 1mm68mMedially 4mm 65mmm).53m                   PT Short Term Goals - 10/08/19 1926      PT SHORT TERM GOAL #1   Title Patient & wife verbalize general understanding of adjusting ply socks with limb volume changes.    (All [employer]  STGs: 10/08/2019)    Baseline NOT MET 10/08/2019    Time 1    Period Months    Status Not Met    Target Date 10/08/19      PT SHORT TERM GOAL #2   Title Sit to / from stand w/c to RW with wife's assistance and maintains upright for 2 minutes with RW  support with minA.    Baseline NOT MET 10/08/2019    Time 1    Period Months    Status Not Met    Target Date 10/08/19      PT SHORT TERM GOAL #3   Title Stand pivot transfer with RW with modA    Baseline NOT MET 10/08/2019    Time 1    Period Months    Status Not Met    Target Date 10/08/19      PT SHORT TERM GOAL #4   Title Patient tolerates prosthesis wear >12 hours total /day without wound issues.    Baseline NOT MET 10/08/2019    Time 1    Period Months    Status On-going    Target Date 10/08/19      PT SHORT TERM GOAL #5   Title Patient ambulates 22' with RW & prosthesis with modA.    Baseline NOT MET 10/08/2019    Time 1    Period Months    Status On-going    Target Date 10/08/19             PT Long Term Goals - 08/06/19 2236      PT LONG TERM GOAL #1   Title Patient and wife verbalize & demonstrate understanding of proper prosthetic care & patient able to donne prosthesis modified independent to enable safe utilization of prosthesis. (All LTGs [employer] Date: 11/04/2019)    Time 3    Period Months    Status On-going    Target Date 11/04/19      PT LONG TERM GOAL #2   Title Patient tolerates wear of prosthesis >90% of awake hours without skin or residual limb pain issues to enable function during his day.    Time 3    Period Months    Status On-going    Target Date 11/04/19      PT LONG TERM GOAL #3   Title Standing balance with RW support: maintains upright 2 minutes, reaches 5" anteriorly and scans environment with supervision.    Time 3    Period Months    Status On-going    Target Date 11/04/19      PT LONG TERM GOAL #4   Title Patient ambulates 100' with RW & prosthesis with wife's assistance /  supervision and turns 180* to position to sit safely.    Time 3    Period Months    Status On-going    Target Date 11/04/19      PT LONG TERM GOAL #5   Title Sit to/from stand and stand-pivot transfers with RW & prosthesis with supervision.    Time 3    Period Months    Status On-going    Target Date 11/04/19                 Plan - 11/02/19 1407    Clinical Impression Statement Patient has two more superficial wounds on residual limb. He has not worn prosthesis for last few weeks so not caused by prosthesis fit. Patient's plan of care ends this week and PT feels need to place him on hold until his limb heals to enable prosthesis wear.  Patient & wife agree with plan to review & update HEP next session.    Personal Factors and Comorbidities Age;Comorbidity 3+;Fitness;Past/Current Experience;Time since onset of injury/illness/exacerbation    Comorbidities Rt TTA, C2-5 Cervical Fusion 2014, Tachypnea, encephalopathy, RLE paralysis, TIA, A-Fib, CAD, CHF, CKD stage 3, Sz disorder, hx  of vertigo, knee arthroplasty Rt X 2 & Lt X 1    Examination-Activity Limitations Caring for Others;Locomotion Level;Reach Overhead;Squat;Stairs;Stand;Transfers    Examination-Participation Restrictions Community Activity    Stability/Clinical Decision Making Evolving/Moderate complexity    Rehab Potential Good    PT Frequency 2x / week    PT Duration Other (comment)   13 weeks (90 days)   PT Treatment/Interventions ADLs/Self Care Home Management;DME Instruction;Gait training;Stair training;Functional mobility training;Therapeutic activities;Therapeutic exercise;Balance training;Neuromuscular re-education;Patient/family education;Prosthetic Training;Passive range of motion;Vestibular    PT Next Visit Plan review & update HEP, then place on hold until able to wear prosthesis    Consulted and Agree with Plan of Care Patient;Family member/caregiver    Family Member Consulted wife, Zyden Suman            Patient will benefit from skilled therapeutic intervention in order to improve the following deficits and impairments:  Abnormal gait, Cardiopulmonary status limiting activity, Decreased activity tolerance, Decreased balance, Decreased endurance, Decreased knowledge of use of DME, Decreased mobility, Decreased range of motion, Decreased skin integrity, Decreased strength, Difficulty walking, Increased edema, Impaired flexibility, Postural dysfunction, Prosthetic Dependency, Pain  Visit Diagnosis: Unsteadiness on feet  Other abnormalities of gait and mobility  Stiffness of right knee, not elsewhere classified  Muscle weakness (generalized)  Abnormal posture  History of falling  Contracture of muscle, multiple sites  Cervicalgia     Problem List Patient Active Problem List   Diagnosis Date Noted  . Permanent atrial fibrillation ([place]) 10/24/2019  . Hypercholesterolemia 10/24/2019  . Current chronic use of systemic steroids 10/24/2019  . Macrocytic anemia 10/24/2019  . Dehiscence of amputation stump ([place])   . Wound infection 05/09/2018  . AKI (acute kidney injury) ([place])   . S/P BKA (below knee amputation) unilateral, right ([place])   . Below-knee amputation with complication, initial encounter ([place])   . Tachypnea   . Post-operative pain   . Subacute osteomyelitis, right ankle and foot ([place])   . Paralysis of right lower extremity ([place])   . TIA (transient ischemic attack) 03/24/2018  . Encephalopathy 03/24/2018  . Cellulitis 03/15/2018  . Hypernatremia 03/15/2018  . Persistent atrial fibrillation ([place]) 11/10/2017  . Severe muscle deconditioning 11/10/2017  . Hemorrhage 10/22/2017  . Coronary artery disease involving native coronary artery of native heart without angina pectoris 10/22/2017  . Acute hypoxemic respiratory failure ([place]) 07/17/2017  . Aspiration syndrome, subsequent encounter 07/11/2017  . Aspiration pneumonia ([place]) 07/01/2017  . Acute encephalopathy  07/01/2017  . Thrombocytopenia ([place]) 07/01/2017  . Chronic diastolic (congestive) heart failure ([place]) 07/01/2017  . Anemia of chronic disease 07/01/2017  . Troponin level elevated 07/01/2017  . Chronic atrial fibrillation 07/01/2017  . Goals of care, counseling/discussion   . Palliative care by specialist   . Acute metabolic encephalopathy 76/72/0947  . Stage 3a chronic kidney disease   . CAP (community acquired pneumonia) 04/17/2017  . Hallux rigidus, right foot 03/10/2016  . Lumbosacral spondylosis without myelopathy 10/29/2015  . Memory difficulty 09/22/2015  . Abnormality of gait 09/22/2015  . Hyperglycemia 07/06/2015  . Chronic pain 07/06/2015  . Chronic insomnia 03/30/2015  . Transient alteration of awareness 03/30/2015  . Abnormal liver function   . Altered mental status 01/31/2015  . Essential hypertension 01/31/2015  . Constipation 01/31/2015  . Hypothyroidism 01/31/2015  . Seizure disorder ([place]) 01/31/2015  . Bladder outlet obstruction 01/31/2015  . GERD (gastroesophageal reflux disease) 01/31/2015  . Chronic back pain 01/31/2015  . Acute kidney injury ([place]) 01/31/2015    Jamey Reas  PT, DPT 11/02/2019, 2:41 PM  [facility] Physical Therapy [address] [place], [place], [address] Phone: [phone]   Fax:  [phone]  Name: [name] MRN: [id number] Date of Birth: January 25, 1934

## 2019-11-04 ENCOUNTER — Ambulatory Visit (INDEPENDENT_AMBULATORY_CARE_PROVIDER_SITE_OTHER): Payer: Medicare Other | Admitting: Physical Therapy

## 2019-11-04 ENCOUNTER — Encounter: Payer: Self-pay | Admitting: Physical Therapy

## 2019-11-04 ENCOUNTER — Other Ambulatory Visit: Payer: Self-pay

## 2019-11-04 DIAGNOSIS — M6249 Contracture of muscle, multiple sites: Secondary | ICD-10-CM

## 2019-11-04 DIAGNOSIS — M6281 Muscle weakness (generalized): Secondary | ICD-10-CM | POA: Diagnosis not present

## 2019-11-04 DIAGNOSIS — R293 Abnormal posture: Secondary | ICD-10-CM

## 2019-11-04 DIAGNOSIS — R2689 Other abnormalities of gait and mobility: Secondary | ICD-10-CM

## 2019-11-04 DIAGNOSIS — M25661 Stiffness of right knee, not elsewhere classified: Secondary | ICD-10-CM | POA: Diagnosis not present

## 2019-11-04 DIAGNOSIS — R2681 Unsteadiness on feet: Secondary | ICD-10-CM | POA: Diagnosis not present

## 2019-11-04 DIAGNOSIS — M542 Cervicalgia: Secondary | ICD-10-CM

## 2019-11-04 NOTE — Patient Instructions (Signed)
Access Code: [email or web address] URL: [email or web address] Date: 11/04/2019 Prepared by: Jamey Reas  Exercises Seated Chair Push Ups - 2 x daily - 7 x weekly - 5 reps - 2 sets - 5 seconds hold Seated Isometric Cervical Retraction with Chin Tucks with Pillow behind head - 2 x daily - 7 x weekly - 5 reps - 2 sets - 5 seconds hold   Access Code: [email or web address] URL: [email or web address] Date: 11/04/2019 Prepared by: Jamey Reas  Exercises Seated Hip Internal Rotation AROM - 1 x daily - 7 x weekly - 1 sets - 10 reps - 5 seconds hold Supine Hip Internal and External Rotation - 1 x daily - 7 x weekly - 1 sets - 10 reps - 5 seconds hold Seated Long Arc Quad - 1 x daily - 7 x weekly - 1 sets - 10 reps - 5 seconds hold Seated Quad Set - 1 x daily - 7 x weekly - 1 sets - 10 reps - 5 seconds hold Seated Isometric Hip Adduction with Ball - 1 x daily - 7 x weekly - 1 sets - 10 reps - 5 seconds hold Seated Isometric Hip Abduction with Belt - 1 x daily - 7 x weekly - 1 sets - 10 reps - 5 seconds hold Seated Upright Posture Correction - 1-2 x daily - 7 x weekly - 1 sets - 5 minutes hold

## 2019-11-04 NOTE — Therapy (Signed)
[facility] [address] [place], [place], [address] Phone: [phone]   Fax:  [phone]  Physical Therapy Treatment & Discharge Summary  Patient Details  Name: [name] MRN: [id number] Date of Birth: 06-28-1933 Referring Provider (PT): Bevely Palmer Persons, [place]   Encounter Date: 11/04/2019  PHYSICAL THERAPY DISCHARGE SUMMARY  Visits from Start of Care: 40  Current functional level related to goals / functional outcomes: See below   Remaining deficits: Patient has significant weakness. He has wounds on residual limb and is unable to wear prosthesis at this time.    Education / Equipment: HEP Plan: Patient agrees to discharge.  Patient goals were not met. Patient is being discharged due to a change in medical status.  ?????        PT End of Session - 11/04/19 1928    Visit Number 40    Number of Visits 48    Date for PT Re-Evaluation 11/04/19    Authorization Type BCBS & Medicare    PT Start Time 2947    PT Stop Time 1432    PT Time Calculation (min) 47 min    Equipment Utilized During Treatment Gait belt    Activity Tolerance Patient tolerated treatment well;Patient limited by fatigue    Behavior During Therapy WFL for tasks assessed/performed           Past Medical History:  Diagnosis Date  . Abnormality of gait 09/22/2015  . Arthritis   . Atrial fibrillation ([place])   . CAD (coronary artery disease)    Stent to RCA, Penta stent, 99% reduced to 0% 2002.  . Cancer ([place])    skin CA removed from back  . Chronic insomnia 03/30/2015  . Complication of anesthesia    trouble waking up  . GERD (gastroesophageal reflux disease)   . Hypercholesteremia   . Hypertension   . Hypothyroidism   . Memory difficulty 09/22/2015  . Osteoarthritis   . Pneumonia   . Seizures ([place])   . Sepsis ([place]) 05/2017  . Transient alteration of awareness 03/30/2015  . Vertigo    hx of    Past Surgical History:  Procedure Laterality Date  .  AMPUTATION Right 03/28/2018   Procedure: AMPUTATION BELOW KNEE;  Surgeon: Newt Minion, MD;  Location: [place];  Service: Orthopedics;  Laterality: Right;  . APPLICATION OF WOUND VAC Right 01/23/2019   Procedure: Application Of  Prevena Wound Vac;  Surgeon: Newt Minion, MD;  Location: [place];  Service: Orthopedics;  Laterality: Right;  . BACK SURGERY    . EYE SURGERY     Bilateral Cataract surgery   . HERNIA REPAIR    . I & D EXTREMITY Right 05/10/2015   Procedure: IRRIGATION AND DEBRIDEMENT EXTREMITY;  Surgeon: Leanora Cover, MD;  Location: [place];  Service: Orthopedics;  Laterality: Right;  . KNEE ARTHROPLASTY     right knee X 2; left knee once  . LAMINECTOMY     X 6  . LEG AMPUTATION BELOW KNEE Right 03/28/2018  . POSTERIOR CERVICAL FUSION/FORAMINOTOMY  01/28/2012   Procedure: POSTERIOR CERVICAL FUSION/FORAMINOTOMY LEVEL 3;  Surgeon: [name], MD;  Location: [place] NEURO ORS;  Service: Neurosurgery;  Laterality: Left;  Posterior Cervical Five-Thoracic One Fusion, Arthrodesis with LEFT Cervical Seven-thoracic One Laminectomy, Foraminotomy and Resection of Synovial Cyst  . POSTERIOR CERVICAL FUSION/FORAMINOTOMY N/A 01/29/2013   Procedure: POSTERIOR CERVICAL FUSION/FORAMINOTOMY LEVEL 1 and C2-5 Posteriolateral Arthrodesis;  Surgeon: [name], MD;  Location: [facility]  NEURO [facility]S;  Service: Neurosurgery;  Laterality: N/A;  C2-C3 Laminectomy C2-C3 posterior cervical arthrodesis  . STUMP REVISION Right 01/23/2019   Procedure: REVISION RIGHT BELOW KNEE AMPUTATION;  Surgeon: Newt Minion, MD;  Location: [place];  Service: Orthopedics;  Laterality: Right;  . TONSILLECTOMY      There were no vitals filed for this visit.   Subjective Assessment - 11/04/19 1348    Subjective He saw Dr. Sharlett Iles yesterday. He does not think the wounds look infected. His wife has been taping cut up old Duda socks at night & compression socks during day. She reports Dr. Sharlett Iles recommended to  continue.    Patient is accompained by: Family member   wife - [name]   Pertinent History Rt TTA, C2-5 Cervical Fusion 2014, Tachypnea, encephalopathy, RLE paralysis, TIA, A-Fib, CAD, CHF, CKD stage 3, Sz disorder, hx of vertigo, knee arthroplasty Rt X 2 & Lt X 1    Limitations Lifting;Standing;Walking;House hold activities    Patient Stated Goals To get prosthesis, go to bathroom alone, get around house, get out in community.    Currently in Pain? Yes    Pain Score 3     Pain Location Other (Comment)   all over arthritis   Pain Descriptors / Indicators Aching;Sore    Pain Type Chronic pain    Pain Onset More than a month ago    Aggravating Factors  arthritis    Pain Relieving Factors medications.    Pain Onset More than a month ago           Access Code: [email or web address] URL: [email or web address] Date: 11/04/2019 Prepared by: Jamey Reas  Exercises Seated Chair Push Ups - 2 x daily - 7 x weekly - 5 reps - 2 sets - 5 seconds hold Seated Isometric Cervical Retraction with Chin Tucks with Pillow behind head - 2 x daily - 7 x weekly - 5 reps - 2 sets - 5 seconds hold   Access Code: [email or web address] URL: [email or web address] Date: 11/04/2019 Prepared by: Jamey Reas  Exercises Seated Hip Internal Rotation AROM - 1 x daily - 7 x weekly - 1 sets - 10 reps - 5 seconds hold Supine Hip Internal and External Rotation - 1 x daily - 7 x weekly - 1 sets - 10 reps - 5 seconds hold Seated Long Arc Quad - 1 x daily - 7 x weekly - 1 sets - 10 reps - 5 seconds hold Seated Quad Set - 1 x daily - 7 x weekly - 1 sets - 10 reps - 5 seconds hold Seated Isometric Hip Adduction with Ball - 1 x daily - 7 x weekly - 1 sets - 10 reps - 5 seconds hold Seated Isometric Hip Abduction with Belt - 1 x daily - 7 x weekly - 1 sets - 10 reps - 5 seconds hold Seated Upright Posture Correction - 1-2 x daily - 7 x weekly - 1 sets - 5 minutes  hold                           PT Education - 11/04/19 1430    Education Details updated HEP - see pt instructions    Person(s) Educated Spouse;Patient    Methods Explanation;Demonstration;Tactile cues;Verbal cues;Handout    Comprehension Verbalized understanding;Returned demonstration;Verbal cues required;Tactile cues required            PT Short Term Goals - 10/08/19 1926      PT SHORT TERM GOAL #  1   Title Patient & wife verbalize general understanding of adjusting ply socks with limb volume changes.    (All Target STGs: 10/08/2019)    Baseline NOT MET 10/08/2019    Time 1    Period Months    Status Not Met    Target Date 10/08/19      PT SHORT TERM GOAL #2   Title Sit to / from stand w/c to RW with wife's assistance and maintains upright for 2 minutes with RW support with minA.    Baseline NOT MET 10/08/2019    Time 1    Period Months    Status Not Met    Target Date 10/08/19      PT SHORT TERM GOAL #3   Title Stand pivot transfer with RW with modA    Baseline NOT MET 10/08/2019    Time 1    Period Months    Status Not Met    Target Date 10/08/19      PT SHORT TERM GOAL #4   Title Patient tolerates prosthesis wear >12 hours total /day without wound issues.    Baseline NOT MET 10/08/2019    Time 1    Period Months    Status On-going    Target Date 10/08/19      PT SHORT TERM GOAL #5   Title Patient ambulates 76' with RW & prosthesis with modA.    Baseline NOT MET 10/08/2019    Time 1    Period Months    Status On-going    Target Date 10/08/19             PT Long Term Goals - 11/04/19 1929      PT LONG TERM GOAL #1   Title Patient and wife verbalize & demonstrate understanding of proper prosthetic care & patient able to donne prosthesis modified independent to enable safe utilization of prosthesis. (All LTGs Target Date: 11/04/2019)    Baseline NOT MET 11/04/2019 Patient developed wound on residual limb so unable to wear prosthesis.     Time 3    Period Months    Status Not Met      PT LONG TERM GOAL #2   Title Patient tolerates wear of prosthesis >90% of awake hours without skin or residual limb pain issues to enable function during his day.    Baseline NOT MET 11/04/2019 Patient developed wound on residual limb so unable to wear prosthesis.    Time 3    Period Months    Status Not Met      PT LONG TERM GOAL #3   Title Standing balance with RW support: maintains upright 2 minutes, reaches 5" anteriorly and scans environment with supervision.    Baseline NOT MET 11/04/2019 Patient developed wound on residual limb so unable to wear prosthesis.    Time 3    Period Months    Status Not Met      PT LONG TERM GOAL #4   Title Patient ambulates 6' with RW & prosthesis with wife's assistance / supervision and turns 180* to position to sit safely.    Baseline NOT MET 11/04/2019 Patient developed wound on residual limb so unable to wear prosthesis.    Time 3    Period Months    Status Not Met      PT LONG TERM GOAL #5   Title Sit to/from stand and stand-pivot transfers with RW & prosthesis with supervision.    Baseline NOT MET 11/04/2019 Patient developed  wound on residual limb so unable to wear prosthesis.    Time 3    Period Months    Status Not Met                 Plan - 11/04/19 1934    Clinical Impression Statement Patient did not meet any LTGs due to wounds on residual limb and unable to wear prosthesis.  PT reviewed HEP to enable patient to maintain basic level of strength. His wife and he appear to understand.  PT to hold / discharge until his residual limb heals to enable return to prosthetic wear.    Personal Factors and Comorbidities Age;Comorbidity 3+;Fitness;Past/Current Experience;Time since onset of injury/illness/exacerbation    Comorbidities Rt TTA, C2-5 Cervical Fusion 2014, Tachypnea, encephalopathy, RLE paralysis, TIA, A-Fib, CAD, CHF, CKD stage 3, Sz disorder, hx of vertigo, knee arthroplasty Rt  X 2 & Lt X 1    Examination-Activity Limitations Caring for Others;Locomotion Level;Reach Overhead;Squat;Stairs;Stand;Transfers    Examination-Participation Restrictions Community Activity    Stability/Clinical Decision Making Evolving/Moderate complexity    Rehab Potential Good    PT Frequency 2x / week    PT Duration Other (comment)   13 weeks (90 days)   PT Treatment/Interventions ADLs/Self Care Home Management;DME Instruction;Gait training;Stair training;Functional mobility training;Therapeutic activities;Therapeutic exercise;Balance training;Neuromuscular re-education;Patient/family education;Prosthetic Training;Passive range of motion;Vestibular    PT Next Visit Plan discharge PT    Consulted and Agree with Plan of Care Patient;Family member/caregiver    Family Member Consulted wife, Fedor Kazmierski           Patient will benefit from skilled therapeutic intervention in order to improve the following deficits and impairments:  Abnormal gait, Cardiopulmonary status limiting activity, Decreased activity tolerance, Decreased balance, Decreased endurance, Decreased knowledge of use of DME, Decreased mobility, Decreased range of motion, Decreased skin integrity, Decreased strength, Difficulty walking, Increased edema, Impaired flexibility, Postural dysfunction, Prosthetic Dependency, Pain  Visit Diagnosis: Unsteadiness on feet  Other abnormalities of gait and mobility  Stiffness of right knee, not elsewhere classified  Muscle weakness (generalized)  Abnormal posture  Contracture of muscle, multiple sites  Cervicalgia     Problem List Patient Active Problem List   Diagnosis Date Noted  . Permanent atrial fibrillation ([place]) 10/24/2019  . Hypercholesterolemia 10/24/2019  . Current chronic use of systemic steroids 10/24/2019  . Macrocytic anemia 10/24/2019  . Dehiscence of amputation stump ([place])   . Wound infection 05/09/2018  . AKI (acute kidney injury) ([place])   . S/P BKA  (below knee amputation) unilateral, right ([place])   . Below-knee amputation with complication, initial encounter ([place])   . Tachypnea   . Post-operative pain   . Subacute osteomyelitis, right ankle and foot ([place])   . Paralysis of right lower extremity ([place])   . TIA (transient ischemic attack) 03/24/2018  . Encephalopathy 03/24/2018  . Cellulitis 03/15/2018  . Hypernatremia 03/15/2018  . Persistent atrial fibrillation ([place]) 11/10/2017  . Severe muscle deconditioning 11/10/2017  . Hemorrhage 10/22/2017  . Coronary artery disease involving native coronary artery of native heart without angina pectoris 10/22/2017  . Acute hypoxemic respiratory failure ([place]) 07/17/2017  . Aspiration syndrome, subsequent encounter 07/11/2017  . Aspiration pneumonia ([place]) 07/01/2017  . Acute encephalopathy 07/01/2017  . Thrombocytopenia ([place]) 07/01/2017  . Chronic diastolic (congestive) heart failure ([place]) 07/01/2017  . Anemia of chronic disease 07/01/2017  . Troponin level elevated 07/01/2017  . Chronic atrial fibrillation 07/01/2017  . Goals of care, counseling/discussion   . Palliative care by specialist   .  Acute metabolic encephalopathy 77/93/9030  . Stage 3a chronic kidney disease   . CAP (community acquired pneumonia) 04/17/2017  . Hallux rigidus, right foot 03/10/2016  . Lumbosacral spondylosis without myelopathy 10/29/2015  . Memory difficulty 09/22/2015  . Abnormality of gait 09/22/2015  . Hyperglycemia 07/06/2015  . Chronic pain 07/06/2015  . Chronic insomnia 03/30/2015  . Transient alteration of awareness 03/30/2015  . Abnormal liver function   . Altered mental status 01/31/2015  . Essential hypertension 01/31/2015  . Constipation 01/31/2015  . Hypothyroidism 01/31/2015  . Seizure disorder ([place]) 01/31/2015  . Bladder outlet obstruction 01/31/2015  . GERD (gastroesophageal reflux disease) 01/31/2015  . Chronic back pain 01/31/2015  . Acute kidney injury ([place]) 01/31/2015    Jamey Reas, PT, DPT 11/04/2019, 7:45 PM  [facility] [address] [place], [place], [address] Phone: [phone]   Fax:  [phone]  Name: BHARATH BERNSTEIN MRN: [id number] Date of Birth: 1933-06-10

## 2019-11-10 ENCOUNTER — Ambulatory Visit (INDEPENDENT_AMBULATORY_CARE_PROVIDER_SITE_OTHER): Payer: Medicare Other | Admitting: Physician Assistant

## 2019-11-10 ENCOUNTER — Encounter: Payer: Self-pay | Admitting: Physician Assistant

## 2019-11-10 DIAGNOSIS — M86271 Subacute osteomyelitis, right ankle and foot: Secondary | ICD-10-CM

## 2019-11-10 NOTE — Progress Notes (Signed)
Office Visit Note   Patient: [name]           Date of Birth: 28-Dec-1933           MRN: [id number] Visit Date: 11/10/2019              Requested by: [name], [place],  [place] [address] PCP: [name], MD  Chief Complaint  Patient presents with  . Right Leg - Follow-up      HPI: Patient follows up with a small abrasion on his right leg below-knee amputation.  He has been wearing a shrinker on this side.  His wife does not like removing the shrinker because it removes a scab and brings it.  Assessment & Plan: Visit Diagnoses: No diagnosis found.  Plan: I explained to his wife the purpose of the shrinker.  She will continue current care.  On the left leg I have warned her against using an excessive amount of medical tape over areas of small abrasions.  Follow-up in 3 weeks  Follow-Up Instructions: No follow-ups on file.   Ortho Exam  Patient is alert, oriented, no adenopathy, well-dressed, normal affect, normal respiratory effort. Focused examination of the right amputation stump is well-healed surgical incision no cellulitis no dehiscence no sign of infection.  On the lateral side he has a small scabbed area this was debrided with removal of the sock it is healthy healthy without any surrounding cellulitis.  Good vascular fibrinous tissue at skin surface no evidence of any infection  Imaging: No results found. No images are attached to the encounter.  Labs: Lab Results  Component Value Date   HGBA1C 5.7 (H) 03/26/2018   HGBA1C 5.0 07/12/2017   HGBA1C 5.8 (H) 07/06/2015   ESRSEDRATE 24 (H) 03/24/2018   ESRSEDRATE 15 04/18/2017   ESRSEDRATE 5 09/17/2007   CRP 3.1 (H) 05/11/2018   CRP 1.8 (H) 03/24/2018   CRP <0.8 04/24/2017   LABURIC 6.0 03/09/2016   REPTSTATUS 07/12/2019 FINAL 07/10/2019   GRAMSTAIN  07/15/2017    WBC PRESENT,BOTH PMN AND MONONUCLEAR NO ORGANISMS SEEN Performed at [facility], [place] [address]  [place], [place] [address]    CULT >=100,000 COLONIES/mL ESCHERICHIA COLI (A) 07/10/2019   LABORGA ESCHERICHIA COLI (A) 07/10/2019     Lab Results  Component Value Date   ALBUMIN 3.6 10/16/2019   ALBUMIN 3.6 12/20/2018   ALBUMIN 3.0 (L) 05/09/2018   PREALBUMIN 36 (H) 11/22/2017   PREALBUMIN 28.8 04/24/2017   LABURIC 6.0 03/09/2016    Lab Results  Component Value Date   MG 2.0 05/12/2018   MG 1.9 05/11/2018   MG 1.9 03/31/2018   No results found for: VD25OH  Lab Results  Component Value Date   PREALBUMIN 36 (H) 11/22/2017   PREALBUMIN 28.8 04/24/2017   CBC EXTENDED Latest Ref Rng & Units 10/16/2019 07/10/2019 01/23/2019  WBC 3.4 - 10.8 x10E3/uL 6.3 8.5 7.3  RBC 4.14 - 5.80 x10E6/uL 3.27(L) 3.51(L) 3.80(L)  HGB 13.0 - 17.7 g/dL 11.0(L) 10.9(L) 12.4(L)  HCT 37.5 - 51.0 % 33.0(L) 35.5(L) 41.2  PLT 150 - 450 x10E3/uL 95(LL) 117(L) 135(L)  NEUTROABS 1.7 - 7.7 K/uL - 6.2 -  LYMPHSABS 0.7 - 4.0 K/uL - 1.1 -     There is no height or weight on file to calculate BMI.  Orders:  No orders of the defined types were placed in this encounter.  No orders of the defined types were placed in this encounter.  Procedures: No procedures performed  Clinical Data: No additional findings.  ROS:  All other systems negative, except as noted in the HPI. Review of Systems  Objective: Vital Signs: There were no vitals taken for this visit.  Specialty Comments:  No specialty comments available.  PMFS History: Patient Active Problem List   Diagnosis Date Noted  . Permanent atrial fibrillation ([place]) 10/24/2019  . Hypercholesterolemia 10/24/2019  . Current chronic use of systemic steroids 10/24/2019  . Macrocytic anemia 10/24/2019  . Dehiscence of amputation stump ([place])   . Wound infection 05/09/2018  . AKI (acute kidney injury) ([place])   . S/P BKA (below knee amputation) unilateral, right ([place])   . Below-knee amputation with complication, initial encounter ([place])   . Tachypnea     . Post-operative pain   . Subacute osteomyelitis, right ankle and foot ([place])   . Paralysis of right lower extremity ([place])   . TIA (transient ischemic attack) 03/24/2018  . Encephalopathy 03/24/2018  . Cellulitis 03/15/2018  . Hypernatremia 03/15/2018  . Persistent atrial fibrillation ([place]) 11/10/2017  . Severe muscle deconditioning 11/10/2017  . Hemorrhage 10/22/2017  . Coronary artery disease involving native coronary artery of native heart without angina pectoris 10/22/2017  . Acute hypoxemic respiratory failure ([place]) 07/17/2017  . Aspiration syndrome, subsequent encounter 07/11/2017  . Aspiration pneumonia ([place]) 07/01/2017  . Acute encephalopathy 07/01/2017  . Thrombocytopenia ([place]) 07/01/2017  . Chronic diastolic (congestive) heart failure ([place]) 07/01/2017  . Anemia of chronic disease 07/01/2017  . Troponin level elevated 07/01/2017  . Chronic atrial fibrillation 07/01/2017  . Goals of care, counseling/discussion   . Palliative care by specialist   . Acute metabolic encephalopathy 62/83/1517  . Stage 3a chronic kidney disease   . CAP (community acquired pneumonia) 04/17/2017  . Hallux rigidus, right foot 03/10/2016  . Lumbosacral spondylosis without myelopathy 10/29/2015  . Memory difficulty 09/22/2015  . Abnormality of gait 09/22/2015  . Hyperglycemia 07/06/2015  . Chronic pain 07/06/2015  . Chronic insomnia 03/30/2015  . Transient alteration of awareness 03/30/2015  . Abnormal liver function   . Altered mental status 01/31/2015  . Essential hypertension 01/31/2015  . Constipation 01/31/2015  . Hypothyroidism 01/31/2015  . Seizure disorder ([place]) 01/31/2015  . Bladder outlet obstruction 01/31/2015  . GERD (gastroesophageal reflux disease) 01/31/2015  . Chronic back pain 01/31/2015  . Acute kidney injury ([place]) 01/31/2015   Past Medical History:  Diagnosis Date  . Abnormality of gait 09/22/2015  . Arthritis   . Atrial fibrillation ([place])   . CAD (coronary artery  disease)    Stent to RCA, Penta stent, 99% reduced to 0% 2002.  . Cancer ([place])    skin CA removed from back  . Chronic insomnia 03/30/2015  . Complication of anesthesia    trouble waking up  . GERD (gastroesophageal reflux disease)   . Hypercholesteremia   . Hypertension   . Hypothyroidism   . Memory difficulty 09/22/2015  . Osteoarthritis   . Pneumonia   . Seizures ([place])   . Sepsis ([place]) 05/2017  . Transient alteration of awareness 03/30/2015  . Vertigo    hx of    Family History  Problem Relation Age of Onset  . Hypertension Mother   . Cancer Mother   . Kidney failure Father   . Heart disease Father     Past Surgical History:  Procedure Laterality Date  . AMPUTATION Right 03/28/2018   Procedure: AMPUTATION BELOW KNEE;  Surgeon: Newt Minion, MD;  Location: [facility];  Service: Orthopedics;  Laterality: Right;  . APPLICATION OF WOUND VAC Right 01/23/2019   Procedure: Application Of  Prevena Wound Vac;  Surgeon: Newt Minion, MD;  Location: [place];  Service: Orthopedics;  Laterality: Right;  . BACK SURGERY    . EYE SURGERY     Bilateral Cataract surgery   . HERNIA REPAIR    . I & D EXTREMITY Right 05/10/2015   Procedure: IRRIGATION AND DEBRIDEMENT EXTREMITY;  Surgeon: Leanora Cover, MD;  Location: [facility];  Service: Orthopedics;  Laterality: Right;  . KNEE ARTHROPLASTY     right knee X 2; left knee once  . LAMINECTOMY     X 6  . LEG AMPUTATION BELOW KNEE Right 03/28/2018  . POSTERIOR CERVICAL FUSION/FORAMINOTOMY  01/28/2012   Procedure: POSTERIOR CERVICAL FUSION/FORAMINOTOMY LEVEL 3;  Surgeon: [name], MD;  Location: [place] NEURO ORS;  Service: Neurosurgery;  Laterality: Left;  Posterior Cervical Five-Thoracic One Fusion, Arthrodesis with LEFT Cervical Seven-thoracic One Laminectomy, Foraminotomy and Resection of Synovial Cyst  . POSTERIOR CERVICAL FUSION/FORAMINOTOMY N/A 01/29/2013   Procedure: POSTERIOR CERVICAL FUSION/FORAMINOTOMY LEVEL 1 and C2-5  Posteriolateral Arthrodesis;  Surgeon: [name], MD;  Location: [facility] NEURO ORS;  Service: Neurosurgery;  Laterality: N/A;  C2-C3 Laminectomy C2-C3 posterior cervical arthrodesis  . STUMP REVISION Right 01/23/2019   Procedure: REVISION RIGHT BELOW KNEE AMPUTATION;  Surgeon: Newt Minion, MD;  Location: [facility];  Service: Orthopedics;  Laterality: Right;  . TONSILLECTOMY     Social History   Occupational History  . Occupation: retired Software engineer  Tobacco Use  . Smoking status: Former Research scientist (life sciences)  . Smokeless tobacco: Never Used  Vaping Use  . Vaping Use: Never used  Substance and Sexual Activity  . Alcohol use: Not Currently    Comment: rare  . Drug use: No  . Sexual activity: Not Currently

## 2019-11-17 ENCOUNTER — Other Ambulatory Visit: Payer: Self-pay | Admitting: *Deleted

## 2019-11-17 NOTE — Patient Outreach (Signed)
[name]) Care Management  11/17/2019  [name] 1933-07-22 [id number]   Telephone Assessment/Screening-Refer Health Coach  RN received a message that pt's spouse called the call center concerning pt having some constipation issues.   RN returned a call and verified pt's constipation issues have resolved. States pt was administered Doculax liquid concentrate which resulted in several stools. Denies any of the previous symptoms with no N/V or abdominal discomfort/pain. Spouse was very appreciative and grateful for the follow up call. Caregiver is aware if symptoms are present once again to follow up with her provider if not resolved by the OTC medications.  RN further screened pt for [facility] services as wife states his atrial fibrillation and hypertension are under control with major issues for complex case management however agreed to a Health Coach via enrolling into [facility] services (receptive).  Caregiver also states she was searing for services that would sit with the pt for aide services while she goes to the provider offices for her ongoing appointments. Offered to refer to [facility] social worker for available resources in the community (wife receptive).   No other needs at this time as this case manager will close for complex case management.  Raina Mina, RN Care Management Coordinator [place] Office [phone]

## 2019-11-18 ENCOUNTER — Encounter: Payer: Self-pay | Admitting: *Deleted

## 2019-11-18 ENCOUNTER — Other Ambulatory Visit: Payer: Self-pay | Admitting: *Deleted

## 2019-11-18 IMAGING — DX DG TIBIA/FIBULA 2V*R*
4 series · 4 of 4 positions shown · non-contrast
Comparison: Right ankle radiographs March 30, 2017

CLINICAL DATA: Cellulitis

EXAM:
RIGHT TIBIA AND FIBULA - 2 VIEW

[x tib-fib lat right (1 of 2)]
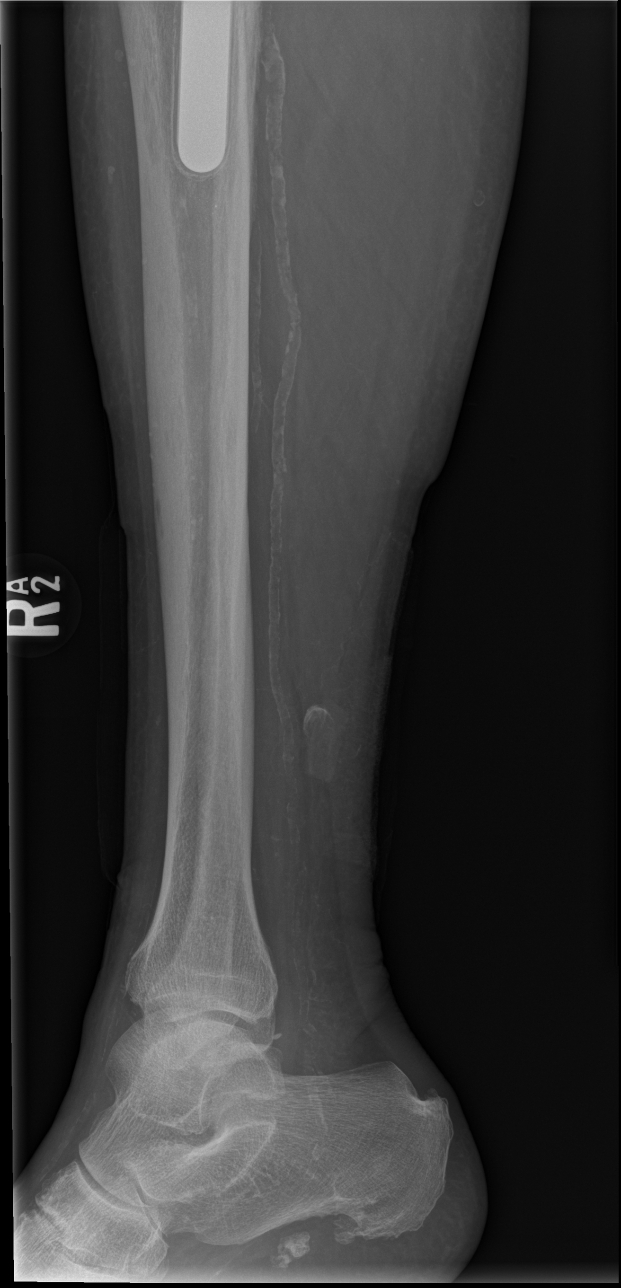

[x tib-fib lat right (2 of 2)]
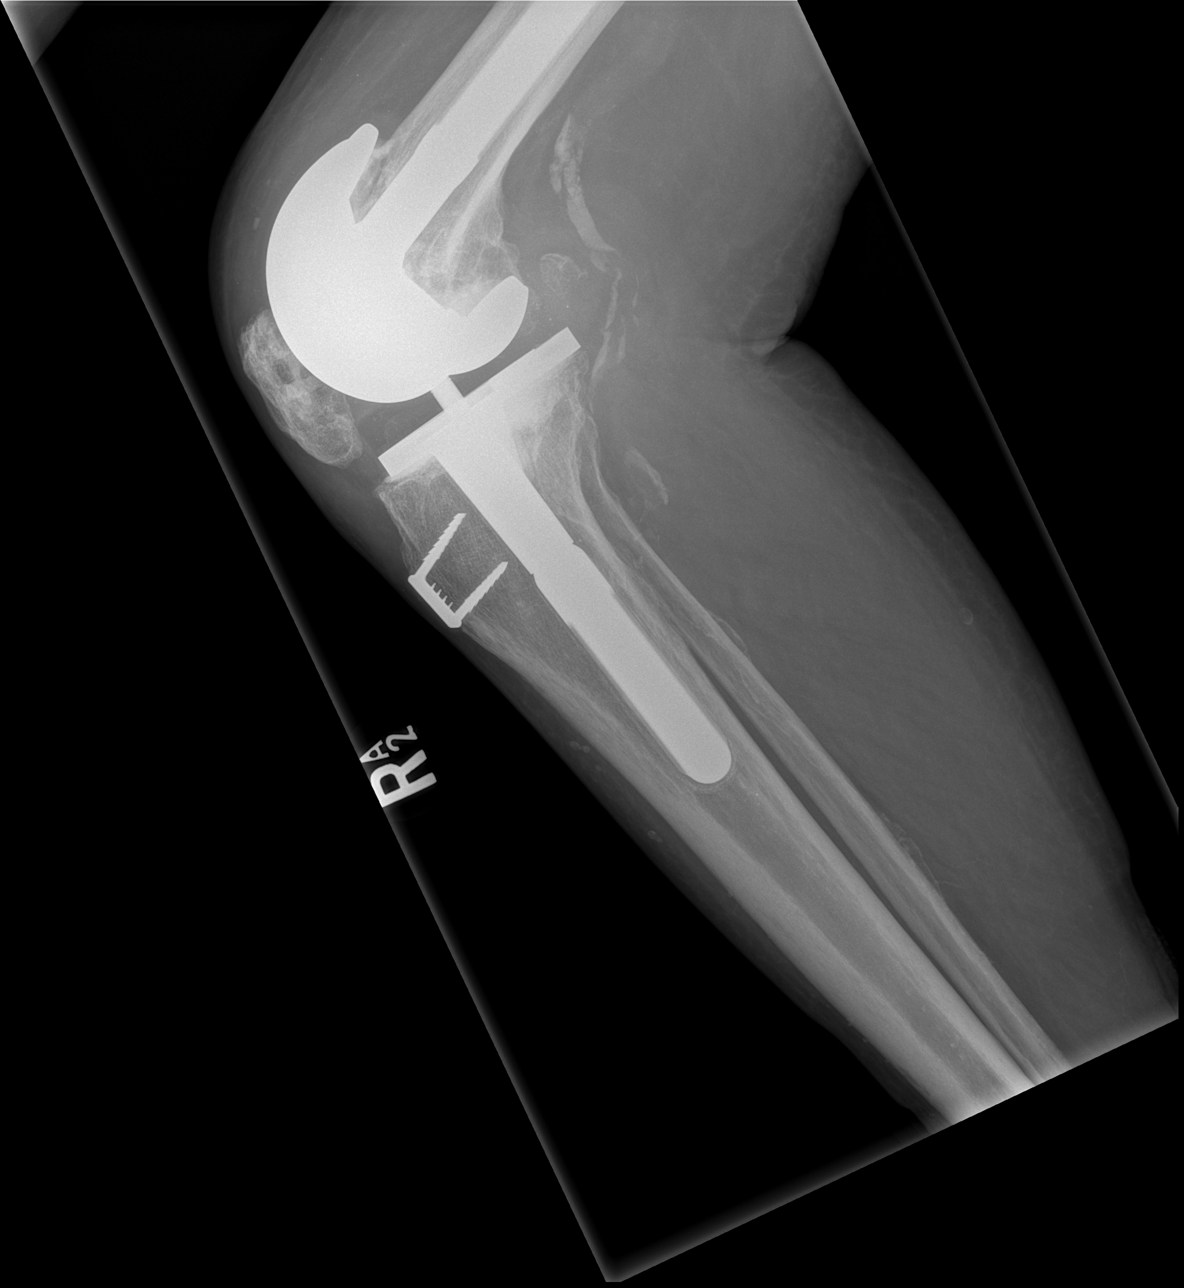

[x tib-fib ap right]
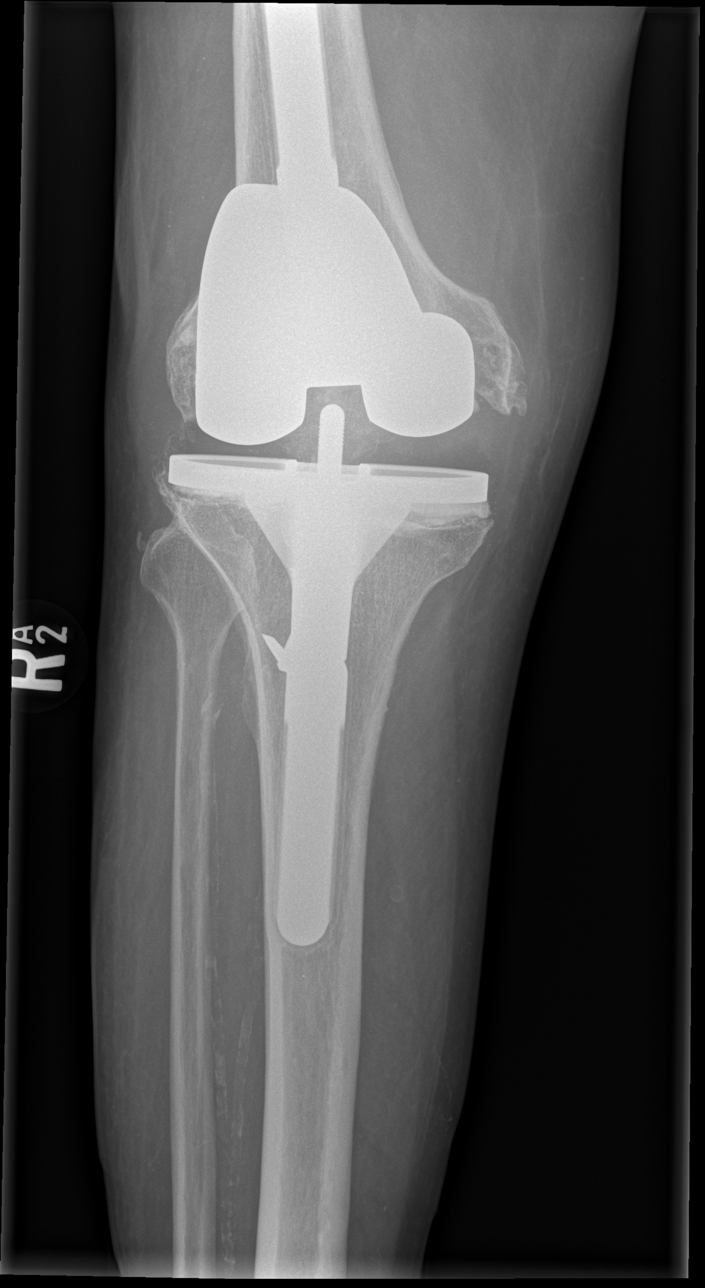

[x tib-fib right 0-3yrs]
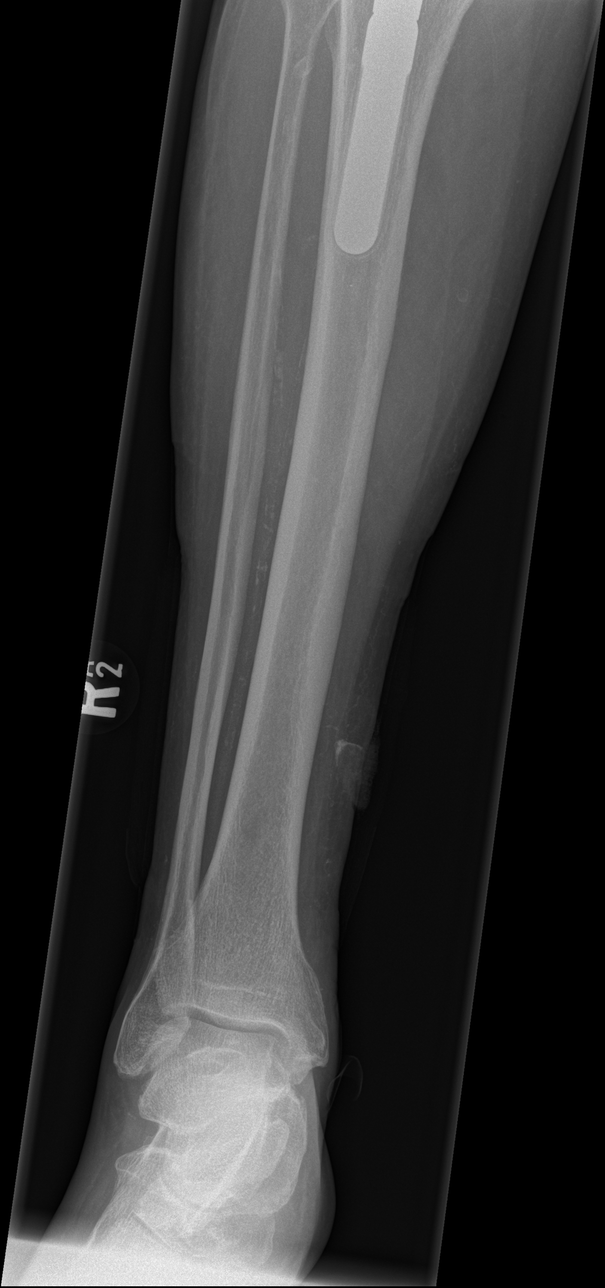

[4 of 4 positions shown; findings below may reference images not displayed]

FINDINGS: Frontal and lateral views were obtained. There is a total knee
replacement with prosthetic components well-seated. No acute
fracture or dislocation. No erosive change or bony destruction
evident. There is extensive arterial vascular calcification as well
as soft tissue calcification which may be indicative of chronic
stasis with likely cellulitis.

There are spurs arising from posterior inferior calcaneus. There are
plantar fascia calcifications.

A bandage overlies the distal tibia and fibula.
IMPRESSION: No fracture or dislocation. No erosive change or bony destruction.
Total knee replacement with prosthetic components well-seated.

Soft tissue calcifications likely due to chronic stasis and
cellulitis. Extensive arterial vascular calcification also noted in
the trifurcation and popliteal arteries. There is a plantar fascia
calcification as well as calcaneal spurs.

## 2019-11-18 NOTE — Patient Outreach (Signed)
[facility]) Care Management  11/18/2019  [name] [birth year] [id number]   CSW made an initial attempt to try and contact patient's wife, Yaw Escoto today, to perform the phone assessment on patient, as well as assess and assist with social work needs and services, without success.  CSW was unable to leave a HIPAA compliant message on voicemail for patient or Mrs. Zenz.  CSW received a fax signal when trying to call patient's home number, and received an automated recording indicating that patient's mailbox has not been set up with the system, when trying to call patient's mobile number.  CSW will make a second outreach attempt within the next 3-4 business days, if a return call is not received from patient or Mrs. Daw in the meantime.  CSW will also mail a Patient Unsuccessful Outreach Letter to patient's home, requesting that patient, or Mrs. Gaumond, contact CSW directly if they are interested in receiving social work services through [place] with [facility].  [name], BSW, MSW, LCSW  Licensed Education officer, environmental Health System  Mailing [place] [address] [place], [place] [address] Physical Address-[address], [place], [place] [address] Toll Free Main # [phone] Fax # [phone] Cell # [phone]  [email or web address]

## 2019-11-19 ENCOUNTER — Other Ambulatory Visit: Payer: Self-pay | Admitting: *Deleted

## 2019-11-20 ENCOUNTER — Ambulatory Visit: Payer: Medicare Other

## 2019-11-21 IMAGING — DX DG CHEST 1V PORT
1 series · 1 of 1 positions shown · non-contrast
Comparison: 04/19/2017

CLINICAL DATA: Short of breath, chest pain

EXAM:
PORTABLE CHEST 1 VIEW

[chest ap]
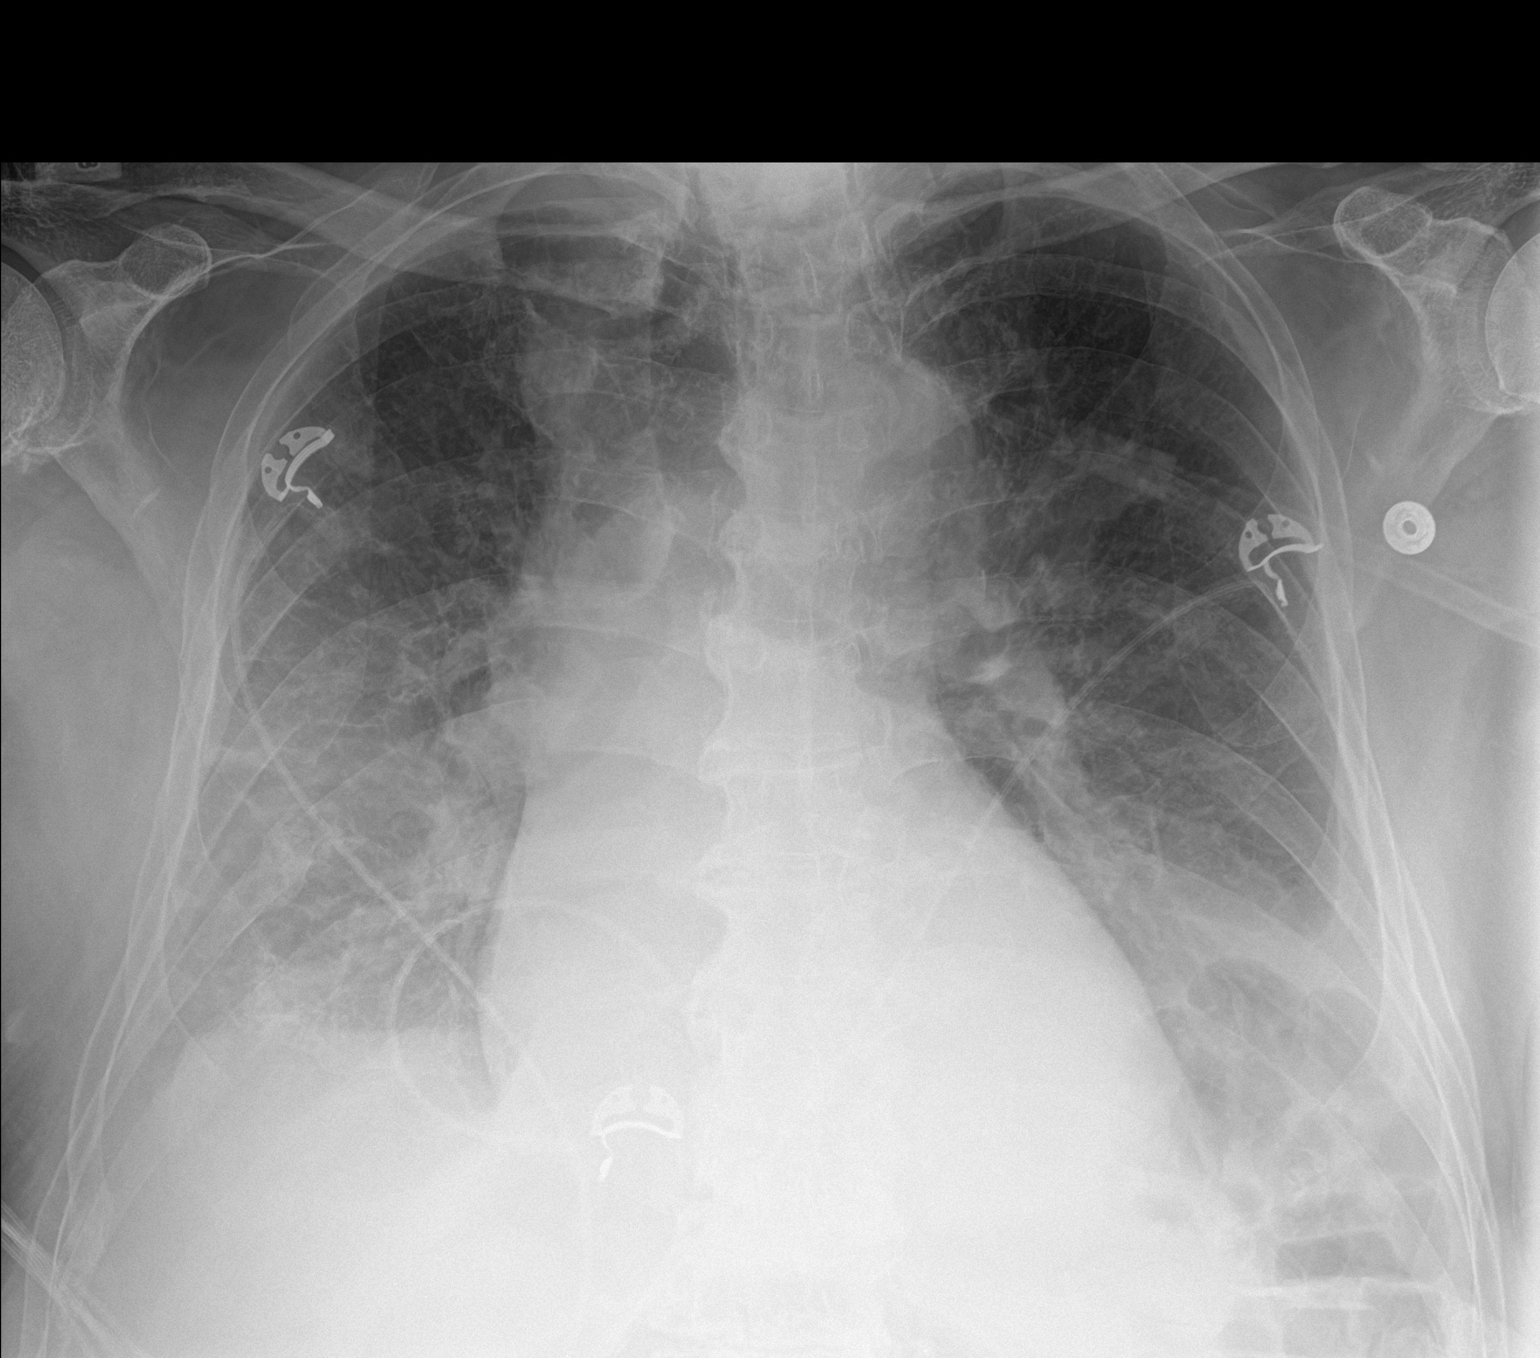

[1 of 1 positions shown; findings below may reference images not displayed]

FINDINGS: Normal cardiac silhouette. Bibasilar effusions again noted.
Bibasilar atelectasis present. Upper lungs are clear. No acute
osseous abnormality. Posterior cervical fusion.
IMPRESSION: No interval change.  Bilateral moderate pleural effusions.

## 2019-11-22 IMAGING — DX DG CHEST 1V PORT
1 series · 1 of 1 positions shown · non-contrast
Comparison: 04/21/2017 and 04/19/2017

CLINICAL DATA: Increasing shortness of breath.

EXAM:
PORTABLE CHEST 1 VIEW

[chest ap]
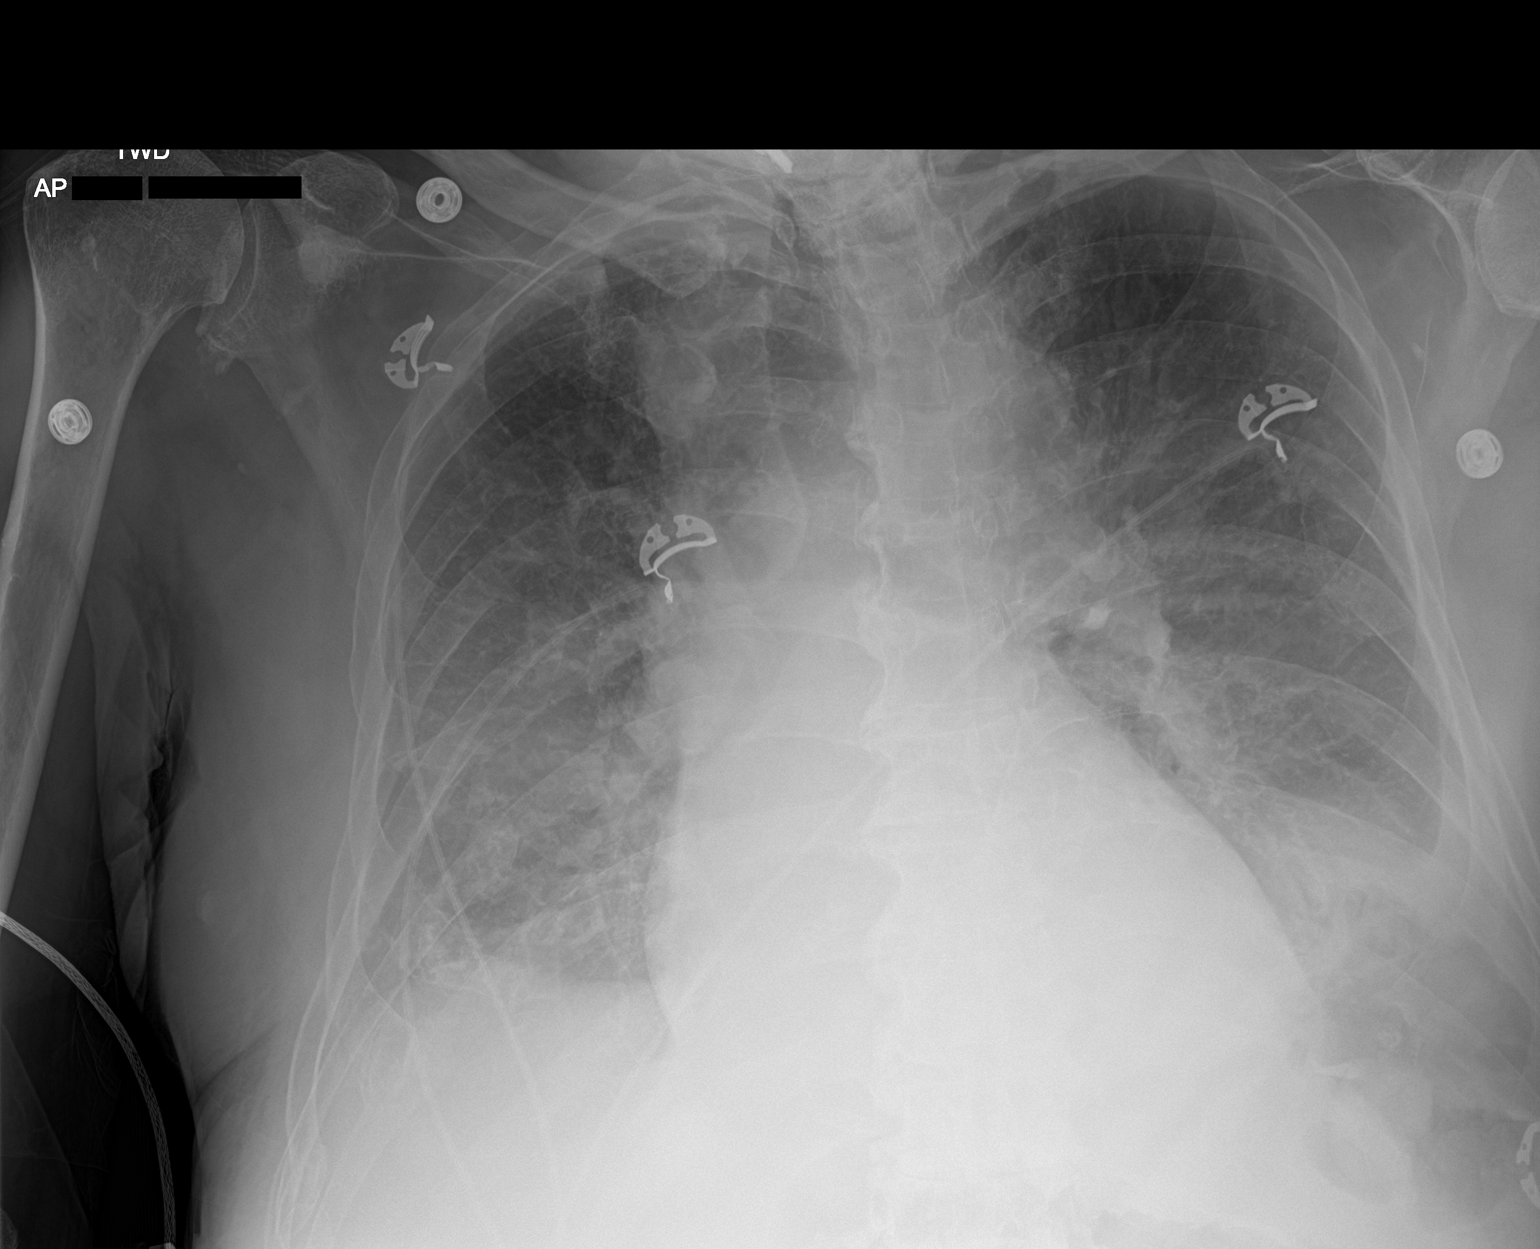

[1 of 1 positions shown; findings below may reference images not displayed]

FINDINGS: There are persistent moderate bilateral pleural effusions. Heart
size is normal. The azygos vein is distended and the pulmonary
vascularity is slightly prominent.
IMPRESSION: No significant change since the prior study. Moderate bilateral
pleural effusions with slight pulmonary vascular congestion.

## 2019-11-24 ENCOUNTER — Other Ambulatory Visit: Payer: Self-pay | Admitting: *Deleted

## 2019-11-24 NOTE — Patient Outreach (Signed)
[facility]) Care Management  11/24/2019  ABDULHAMID OLGIN 1933/06/25 [id number]   CSW made a second attempt to try and contact patient and patient's wife, Julio Zappia today to perform the initial phone assessment on patient, as well as assess and assist with social work needs and services, without success.  CSW was unable to leave a HIPAA compliant message on voicemail for patient or Mrs. Glendening, receiving an automated recording that patient's mailbox has not been set up with the system.  CSW also received a fax signal when trying to contact patient and Mrs. Wainright on their home phone.  CSW will make a third outreach attempt within the next 3-4 business days, if a return call is not received from patient or Mrs. Mace in the meantime.    [name], BSW, MSW, LCSW  Licensed Education officer, environmental Health System  Mailing [place] [address], [place], [place] [address] Physical Address[address], [place], [place] [address] Toll Free Main # [phone] Fax # [phone] Cell # [phone]  [email or web address]

## 2019-11-27 ENCOUNTER — Other Ambulatory Visit: Payer: Self-pay | Admitting: *Deleted

## 2019-11-27 NOTE — Patient Outreach (Addendum)
[facility]) Care Management  11/27/2019  [name] [birth year] [id number]    CSW made a third attempt to try and contact patient and patient's wife, [name] today, to perform the initial phone assessment on patient, as well as assess and assist with social work needs and services, without success.  A HIPAA compliant message was left for patient and [name] on voicemail.  CSW is currently awaiting a return call.  CSW will make a fourth and final outreach attempt to patient and [name], within the next four weeks, if a return call is not received from them in the meantime.  CSW will notify Emelia Loron, patient's [place], also with [facility] [place] [facility], of CSW's inability to make contact with patient, encouraging her to have patient and/or Mrs. Mcdonell contact CSW directly if they are interested in receiving social work services and resources through [place] with [facility].   [name], BSW, MSW, LCSW  Licensed Education officer, environmental Health System  Mailing [place] [address] [place], [place] [address] Physical Address[address], [place], [place] [address] Toll Free Main # [phone] Fax # [phone] Cell # [phone]  [email or web address]

## 2019-11-30 ENCOUNTER — Ambulatory Visit: Payer: Self-pay | Admitting: *Deleted

## 2019-12-01 ENCOUNTER — Ambulatory Visit (INDEPENDENT_AMBULATORY_CARE_PROVIDER_SITE_OTHER): Payer: Medicare Other | Admitting: Orthopedic Surgery

## 2019-12-01 ENCOUNTER — Ambulatory Visit: Payer: Medicare Other | Admitting: Physician Assistant

## 2019-12-01 ENCOUNTER — Encounter: Payer: Self-pay | Admitting: Orthopedic Surgery

## 2019-12-01 VITALS — Ht 69.0 in | Wt 155.0 lb

## 2019-12-01 DIAGNOSIS — Z89511 Acquired absence of right leg below knee: Secondary | ICD-10-CM | POA: Diagnosis not present

## 2019-12-01 DIAGNOSIS — I2510 Atherosclerotic heart disease of native coronary artery without angina pectoris: Secondary | ICD-10-CM | POA: Diagnosis not present

## 2019-12-01 DIAGNOSIS — T8781 Dehiscence of amputation stump: Secondary | ICD-10-CM | POA: Diagnosis not present

## 2019-12-03 ENCOUNTER — Other Ambulatory Visit: Payer: Self-pay | Admitting: Physician Assistant

## 2019-12-03 ENCOUNTER — Telehealth: Payer: Self-pay | Admitting: Orthopedic Surgery

## 2019-12-03 MED ORDER — PENTOXIFYLLINE ER 400 MG PO TBCR: 400.0000 mg | EXTENDED_RELEASE_TABLET | Freq: Three times a day (TID) | ORAL | 3 refills | Status: DC

## 2019-12-03 NOTE — Telephone Encounter (Signed)
I called and sw pt's wife to advise rx has been sent to pharm.

## 2019-12-03 NOTE — Telephone Encounter (Signed)
Pts wife called stating she thought Dr.Duda was gonna call in a rx to help with blood flow to the pts amputation site; she would like to have that sent in if that was an option.   [phone]

## 2019-12-03 NOTE — Telephone Encounter (Signed)
Trental called in

## 2019-12-03 NOTE — Telephone Encounter (Signed)
Do you think Dr. [name] wanted to call in trental for this pt?

## 2019-12-05 ENCOUNTER — Encounter: Payer: Self-pay | Admitting: Orthopedic Surgery

## 2019-12-05 NOTE — Progress Notes (Signed)
Office Visit Note   Patient: [name]           Date of Birth: July 03, 1933           MRN: [id number] Visit Date: 12/01/2019              Requested by: [name], [address] [place],  [place] [address] PCP: [name], MD  Chief Complaint  Patient presents with  . Right Leg - Follow-up    01/23/19 revision right BKA       HPI: Patient is an 86 year old gentleman who is 10 months status post revision right transtibial amputation he complains of pain over the residual limb and a small open wound.  Assessment & Plan: Visit Diagnoses: No diagnosis found.  Plan: Recommended continue with routine wound care and the compression sock.  Recommended avoiding pressure over the residual limb until the ulcer is healed.  Follow-Up Instructions: Return in about 3 weeks (around 12/22/2019).   Ortho Exam  Patient is alert, oriented, no adenopathy, well-dressed, normal affect, normal respiratory effort. Examination patient's right transtibial amputation is cool to the touch there is a partial-thickness ischemic ulcer over the residual limb.  The ulcer does have a superficial eschar.  There is no cellulitis no drainage no signs of infection.  Imaging: No results found. No images are attached to the encounter.  Labs: Lab Results  Component Value Date   HGBA1C 5.7 (H) 03/26/2018   HGBA1C 5.0 07/12/2017   HGBA1C 5.8 (H) 07/06/2015   ESRSEDRATE 24 (H) 03/24/2018   ESRSEDRATE 15 04/18/2017   ESRSEDRATE 5 09/17/2007   CRP 3.1 (H) 05/11/2018   CRP 1.8 (H) 03/24/2018   CRP <0.8 04/24/2017   LABURIC 6.0 03/09/2016   REPTSTATUS 07/12/2019 FINAL 07/10/2019   GRAMSTAIN  07/15/2017    WBC PRESENT,BOTH PMN AND MONONUCLEAR NO ORGANISMS SEEN Performed at [facility], [place] [address], [place], [place] [address]    CULT >=100,000 COLONIES/mL ESCHERICHIA COLI (A) 07/10/2019   LABORGA ESCHERICHIA COLI (A) 07/10/2019     Lab Results  Component Value Date    ALBUMIN 3.6 10/16/2019   ALBUMIN 3.6 12/20/2018   ALBUMIN 3.0 (L) 05/09/2018   PREALBUMIN 36 (H) 11/22/2017   PREALBUMIN 28.8 04/24/2017   LABURIC 6.0 03/09/2016    Lab Results  Component Value Date   MG 2.0 05/12/2018   MG 1.9 05/11/2018   MG 1.9 03/31/2018   No results found for: VD25OH  Lab Results  Component Value Date   PREALBUMIN 36 (H) 11/22/2017   PREALBUMIN 28.8 04/24/2017   CBC EXTENDED Latest Ref Rng & Units 10/16/2019 07/10/2019 01/23/2019  WBC 3.4 - 10.8 x10E3/uL 6.3 8.5 7.3  RBC 4.14 - 5.80 x10E6/uL 3.27(L) 3.51(L) 3.80(L)  HGB 13.0 - 17.7 g/dL 11.0(L) 10.9(L) 12.4(L)  HCT 37.5 - 51.0 % 33.0(L) 35.5(L) 41.2  PLT 150 - 450 x10E3/uL 95(LL) 117(L) 135(L)  NEUTROABS 1.7 - 7.7 K/uL - 6.2 -  LYMPHSABS 0.7 - 4.0 K/uL - 1.1 -     Body mass index is 22.89 kg/m.  Orders:  No orders of the defined types were placed in this encounter.  No orders of the defined types were placed in this encounter.    Procedures: No procedures performed  Clinical Data: No additional findings.  ROS:  All other systems negative, except as noted in the HPI. Review of Systems  Objective: Vital Signs: Ht 5\' 9"  (1.753 m)   Wt 155 lb (70.3 kg)  BMI 22.89 kg/m   Specialty Comments:  No specialty comments available.  PMFS History: Patient Active Problem List   Diagnosis Date Noted  . Permanent atrial fibrillation ([place]) 10/24/2019  . Hypercholesterolemia 10/24/2019  . Current chronic use of systemic steroids 10/24/2019  . Macrocytic anemia 10/24/2019  . Dehiscence of amputation stump ([place])   . Wound infection 05/09/2018  . AKI (acute kidney injury) ([place])   . S/P BKA (below knee amputation) unilateral, right ([place])   . Below-knee amputation with complication, initial encounter ([place])   . Tachypnea   . Post-operative pain   . Subacute osteomyelitis, right ankle and foot ([place])   . Paralysis of right lower extremity ([place])   . TIA (transient ischemic attack) 03/24/2018  .  Encephalopathy 03/24/2018  . Cellulitis 03/15/2018  . Hypernatremia 03/15/2018  . Persistent atrial fibrillation ([place]) 11/10/2017  . Severe muscle deconditioning 11/10/2017  . Hemorrhage 10/22/2017  . Coronary artery disease involving native coronary artery of native heart without angina pectoris 10/22/2017  . Acute hypoxemic respiratory failure ([place]) 07/17/2017  . Aspiration syndrome, subsequent encounter 07/11/2017  . Aspiration pneumonia ([place]) 07/01/2017  . Acute encephalopathy 07/01/2017  . Thrombocytopenia ([place]) 07/01/2017  . Chronic diastolic (congestive) heart failure ([place]) 07/01/2017  . Anemia of chronic disease 07/01/2017  . Troponin level elevated 07/01/2017  . Chronic atrial fibrillation 07/01/2017  . Goals of care, counseling/discussion   . Palliative care by specialist   . Acute metabolic encephalopathy 16/96/7893  . Stage 3a chronic kidney disease   . CAP (community acquired pneumonia) 04/17/2017  . Hallux rigidus, right foot 03/10/2016  . Lumbosacral spondylosis without myelopathy 10/29/2015  . Memory difficulty 09/22/2015  . Abnormality of gait 09/22/2015  . Hyperglycemia 07/06/2015  . Chronic pain 07/06/2015  . Chronic insomnia 03/30/2015  . Transient alteration of awareness 03/30/2015  . Abnormal liver function   . Altered mental status 01/31/2015  . Essential hypertension 01/31/2015  . Constipation 01/31/2015  . Hypothyroidism 01/31/2015  . Seizure disorder ([place]) 01/31/2015  . Bladder outlet obstruction 01/31/2015  . GERD (gastroesophageal reflux disease) 01/31/2015  . Chronic back pain 01/31/2015  . Acute kidney injury ([place]) 01/31/2015   Past Medical History:  Diagnosis Date  . Abnormality of gait 09/22/2015  . Arthritis   . Atrial fibrillation ([place])   . CAD (coronary artery disease)    Stent to RCA, Penta stent, 99% reduced to 0% 2002.  . Cancer ([place])    skin CA removed from back  . Chronic insomnia 03/30/2015  . Complication of anesthesia     trouble waking up  . GERD (gastroesophageal reflux disease)   . Hypercholesteremia   . Hypertension   . Hypothyroidism   . Memory difficulty 09/22/2015  . Osteoarthritis   . Pneumonia   . Seizures ([place])   . Sepsis ([place]) 05/2017  . Transient alteration of awareness 03/30/2015  . Vertigo    hx of    Family History  Problem Relation Age of Onset  . Hypertension Mother   . Cancer Mother   . Kidney failure Father   . Heart disease Father     Past Surgical History:  Procedure Laterality Date  . AMPUTATION Right 03/28/2018   Procedure: AMPUTATION BELOW KNEE;  Surgeon: Newt Minion, MD;  Location: [facility];  Service: Orthopedics;  Laterality: Right;  . APPLICATION OF WOUND VAC Right 01/23/2019   Procedure: Application Of  Prevena Wound Vac;  Surgeon: Newt Minion, MD;  Location: [facility];  Service: Orthopedics;  Laterality:  Right;  Marland Kitchen BACK SURGERY    . EYE SURGERY     Bilateral Cataract surgery   . HERNIA REPAIR    . I & D EXTREMITY Right 05/10/2015   Procedure: IRRIGATION AND DEBRIDEMENT EXTREMITY;  Surgeon: Leanora Cover, MD;  Location: [facility];  Service: Orthopedics;  Laterality: Right;  . KNEE ARTHROPLASTY     right knee X 2; left knee once  . LAMINECTOMY     X 6  . LEG AMPUTATION BELOW KNEE Right 03/28/2018  . POSTERIOR CERVICAL FUSION/FORAMINOTOMY  01/28/2012   Procedure: POSTERIOR CERVICAL FUSION/FORAMINOTOMY LEVEL 3;  Surgeon: [name], MD;  Location: [facility];  Service: Neurosurgery;  Laterality: Left;  Posterior Cervical Five-Thoracic One Fusion, Arthrodesis with LEFT Cervical Seven-thoracic One Laminectomy, Foraminotomy and Resection of Synovial Cyst  . POSTERIOR CERVICAL FUSION/FORAMINOTOMY N/A 01/29/2013   Procedure: POSTERIOR CERVICAL FUSION/FORAMINOTOMY LEVEL 1 and C2-5 Posteriolateral Arthrodesis;  Surgeon: [name], MD;  Location: [facility];  Service: Neurosurgery;  Laterality: N/A;  C2-C3 Laminectomy C2-C3 posterior cervical  arthrodesis  . STUMP REVISION Right 01/23/2019   Procedure: REVISION RIGHT BELOW KNEE AMPUTATION;  Surgeon: Newt Minion, MD;  Location: [facility];  Service: Orthopedics;  Laterality: Right;  . TONSILLECTOMY     Social History   Occupational History  . Occupation: retired Software engineer  Tobacco Use  . Smoking status: Former Research scientist (life sciences)  . Smokeless tobacco: Never Used  Vaping Use  . Vaping Use: Never used  Substance and Sexual Activity  . Alcohol use: Not Currently    Comment: rare  . Drug use: No  . Sexual activity: Not Currently

## 2019-12-07 ENCOUNTER — Telehealth: Payer: Self-pay | Admitting: Cardiovascular Disease

## 2019-12-07 NOTE — Telephone Encounter (Signed)
OK to start Trental. Mild antiplatelet effect, not nearly as likely to cause bleeding as a true anticoagulant.

## 2019-12-07 NOTE — Telephone Encounter (Signed)
Pt and his wife advised and verbalized understanding.

## 2019-12-07 NOTE — Telephone Encounter (Signed)
New message:   Patient wife calling stating that her husband is having some medications issues. The orthopedic told the patient to take Trental. Please call patient wife.

## 2019-12-07 NOTE — Telephone Encounter (Signed)
Pts wife called to report that Dr. [name] put the pt on Trental 400 mg tid to see if helps improve his perfusion of his extremities especially to the area of his BKA and she is asking that I send a message tot Dr. Sallyanne Kuster to be sur that he is okay with him taking this med based on his cardiac history with problems in the past with Eliquis.   Will forward for his review.   From Dr. Victorino December 10/2019 note re: anticoagulation:   AFib: Permanent arrhythmia, well rate controlled.  He has had several severe bleeding events including spontaneous bleeding into his right leg and he is not on anticoagulation.Marland Kitchen

## 2019-12-10 ENCOUNTER — Other Ambulatory Visit: Payer: Self-pay | Admitting: Cardiovascular Disease

## 2019-12-11 DIAGNOSIS — K264 Chronic or unspecified duodenal ulcer with hemorrhage: Secondary | ICD-10-CM

## 2019-12-11 HISTORY — DX: Chronic or unspecified duodenal ulcer with hemorrhage: K26.4

## 2019-12-15 ENCOUNTER — Other Ambulatory Visit: Payer: Self-pay | Admitting: *Deleted

## 2019-12-15 ENCOUNTER — Encounter: Payer: Self-pay | Admitting: *Deleted

## 2019-12-15 NOTE — Patient Outreach (Signed)
[facility]) Care Management  [place]  12/15/2019   [name] 09/14/1933 [id number]  Subjective: Successful telephone outreach call to patient's wife [name]. HIPAA identifiers obtained. Wife reports that the patient's stump wound has improved a little since Dr. [name] prescribed Trental. [name] has been performing his wound care at home as well as being the patient's full-time caregiver. [name] reports that she does has little support and she is in need of Respite assistance. Nurse discussed with wife that [place] has outreach to her but has not been able to contact her to offer assistance. Nurse did provide the wife with CSW contact number and she did notify the CSW that [name] will call her to set up a time to discuss respite care and community resources. Wife reports that the patient has not had any recent falls and the ones he has had this year fortunately did not result in any concerning injury. Wife states they currently have all of the DME the patient needs and reports that their home environment is safe. Wife states that the patient is emotionally doing well considering the ongoing major health issues he has had long-term. Confirming that at times he is tired, frustrated and teary eyed but he is able to release his emotions and is overall doing well.   [name] is concerned about ensuring the patient is receiving adequate nutrition and hydration. Patient does supplement with Ensure and wife is preparing heart healthy, low sodium, and high protein foods for the patient to eat. Wife reports that the patient is not able to participate with PT due to his stump wound and she does encourage the patient to increase his activity as much as possible admitting that this has been a difficult task. [name] is also concerned about the patient being incontinent at night and he is not waking her up for her to clean the patient. Adding that the patient does not have any  breakdown issues on his bottom area and she wants to keep it that way. Nurse did discuss using a condom catheter at night and notified the wife that [employer] store does have the catheter supplies but she will have to look up on the intranet how to measure the patient for the condom to ensure that it will fit adequately without leaking. [name] does take that patient's B/P, pulse, and monitors his calf circumference daily to assess for fluid retention due to patient not being able to weigh himself. The patient's cardiologist has instructed her to double up on the patient's lasix and potassium if his calf measures > 14 inches. Nurse provided education to the wife and she does want to receive additional written education regarding the patient's heart conditions. The wife did not have any further question or concerns and did confirm that she has this nurse's contact information to call her if needed.   Encounter Medications:  Outpatient Encounter Medications as of 12/15/2019  Medication Sig  . alendronate (FOSAMAX) 70 MG tablet Take 70 mg by mouth every Wednesday. Remain upright for 30-60 minutes.  Marland Kitchen aspirin EC 81 MG tablet Take 81 mg by mouth daily.  . Calcium Carb-Cholecalciferol (CALCIUM 600+D) 600-800 MG-UNIT TABS Take 1 tablet by mouth daily.  Marland Kitchen diltiazem (CARDIZEM CD) 240 MG 24 hr capsule TAKE 1 CAPSULE BY MOUTH EVERY MORNING. HOLD IF SYSTOLIC BLOOD PRESSURE IS LESS THAN 100  . divalproex (DEPAKOTE ER) 250 MG 24 hr tablet Take 250-500 mg by mouth See admin instructions. Take 250  mg in the morning and 500 mg in the evening  . doxazosin (CARDURA) 4 MG tablet Take 1 tablet (4 mg total) by mouth at bedtime.  Marland Kitchen HYDROcodone-acetaminophen (NORCO) 10-325 MG tablet Take 1 tablet by mouth every 4 (four) hours as needed for pain.  Marland Kitchen ketoconazole (NIZORAL) 2 % shampoo Apply 1 application topically 2 (two) times a week.  . levothyroxine (SYNTHROID, LEVOTHROID) 150 MCG tablet Take 150 mcg  by mouth daily before breakfast.  . lidocaine (LIDODERM) 5 % Place 1 patch onto the skin daily as needed (pain). Remove & Discard patch within 12 hours or as directed by MD  . meloxicam (MOBIC) 15 MG tablet Take 15 mg by mouth daily.  . metoprolol tartrate (LOPRESSOR) 25 MG tablet TAKE 1 TABLET BY MOUTH TWICE DAILY WITH A MEAL OR IMMEDIATELY AFTER  . Multiple Vitamins-Minerals (CERTAVITE/ANTIOXIDANTS) TABS Take 1 tablet by mouth every evening.   . mupirocin ointment (BACTROBAN) 2 % Apply 1 application topically daily. Apply to right lateral leg wound daily  . nitrofurantoin (MACRODANTIN) 100 MG capsule Take 100 mg by mouth daily.  . nitroGLYCERIN (NITROSTAT) 0.4 MG SL tablet Place 1 tablet (0.4 mg total) under the tongue every 5 (five) minutes as needed for chest pain (call 911 afer 3 doses and if chest pain persists).  Marland Kitchen oxymetazoline (AFRIN) 0.05 % nasal spray Place 1 spray into both nostrils 2 (two) times daily as needed for congestion.  . pentoxifylline (TRENTAL) 400 MG CR tablet Take 1 tablet (400 mg total) by mouth 3 (three) times daily with meals.  . potassium chloride SA (KLOR-CON) 20 MEQ tablet Take 0.5 tablets (10 mEq total) by mouth daily.  . pravastatin (PRAVACHOL) 40 MG tablet Take 40 mg by mouth every evening.   . predniSONE (DELTASONE) 1 MG tablet Take 9 mg by mouth daily.  . furosemide (LASIX) 20 MG tablet Take 1 tablet (20 mg total) by mouth daily.   No facility-administered encounter medications on file as of 12/15/2019.    Functional Status:  No flowsheet data found.  Fall/Depression Screening: Fall Risk  12/15/2019 08/27/2018 06/03/2018  Falls in the past year? 0 0 1  Number falls in past yr: 1 0 0  Injury with Fall? 0 0 0  Risk for fall due to : History of fall(s);Impaired mobility;Impaired balance/gait Impaired mobility Impaired mobility;Impaired vision;Impaired balance/gait  Follow up Falls prevention discussed;Education provided;Falls evaluation completed - Falls  evaluation completed;Education provided;Falls prevention discussed  Comment - - Member and spouse stated that member slid from his w/c to floor. Both deny any injuries, denies hitting head, denies f/u with MD or medical team. Wife [name] stated that [facility] PT on phone at that time and came to home to assist family and provided Ed    [facility] 2/9 Scores 12/15/2019 06/03/2018 04/07/2018 09/04/2017  PHQ - 2 Score 2 2 0 0  PHQ- 9 Score 6 4 - -    Assessment:  Goals Addressed   None    Goals Addressed            This Visit's Progress   . Eat Healthy       Follow Up Date 03/10/20    - change to whole grain breads, cereal, pasta - limit fast food meals to no more than 1 per week - prepare main meal at home 3 to 5 days each week - set a realistic goal    Why is this important?   When you are ready to manage your nutrition or  weight, having a plan and setting goals will help.  Taking small steps to change how you eat and exercise is a good place to start.    Notes: Wife prepares heart healthy, low sodium, high protein foods for patient. Patient supplements with Ensure.     . Manage My Medicine       Follow Up Date 03/10/20    - call for medicine refill 2 or 3 days before it runs out - call if I am sick and can't take my medicine - keep a list of all the medicines I take; vitamins and herbals too - learn to read medicine labels    Why is this important?   These steps will help you keep on track with your medicines.    Notes: Wife gives patient his daily medication.    . Track and Manage Activity and Exertion       Follow Up Date 03/10/20    - make an activity or exercise plan - meet with physical therapist    Why is this important?   Exercising is very important when managing your heart failure.  It will help your heart get stronger.    Notes: Wife states patient will continue with PT when his stump wound improves. She does encouraged patient to increase activity as tolerated  when possible.     . Track and Manage Fluids and Swelling       Follow Up Date 03/10/20    - use salt in moderation - watch for swelling in feet, ankles and legs every day    Why is this important?   It is important to check your weight daily and watch how much salt and liquids you have.  It will help you to manage your heart failure.    Notes: Patient cannot weigh due to BKA. Wife has been instructed to measure calf and if > than 14 inches double lasix and potassium.    . Track and Manage My Blood Pressure       Follow Up Date 03/10/20    - check blood pressure daily - write blood pressure results in a log or diary    Why is this important?   You won't feel high blood pressure, but it can still hurt your blood vessels.  High blood pressure can cause heart or kidney problems. It can also cause a stroke.  Making lifestyle changes like losing a little weight or eating less salt will help.  Checking your blood pressure at home and at different times of the day can help to control blood pressure.  If the doctor prescribes medicine remember to take it the way the doctor ordered.  Call the office if you cannot afford the medicine or if there are questions about it.     Notes:     . Track and Manage Symptoms       Follow Up Date 03/10/20    - begin a heart failure diary - develop a rescue plan - eat more whole grains, fruits and vegetables, lean meats and healthy fats - follow rescue plan if symptoms flare-up - know when to call the doctor - track symptoms and what helps feel better or worse - dress right for the weather, hot or cold    Why is this important?   You will be able to handle your symptoms better if you keep track of them.  Making some simple changes to your lifestyle will help.  Eating healthy is one thing  you can do to take good care of yourself.    Notes: Wife has action plan to measure calf circumference if > 14 inches double lasix and potassium for the day due  to patient cannot weigh      Plan: [place] will send PCP a barrier letter and today's assessment note, will send patient/wife education regarding hypertension, Afib, CHF, and will send the patient Ensure coupons, will call patient within the month of November, and patient/wife agrees to future outreach calls.   Emelia Loron RN, BSN [place] [phone] [email or web address]

## 2019-12-17 ENCOUNTER — Other Ambulatory Visit: Payer: Self-pay | Admitting: Cardiovascular Disease

## 2019-12-22 ENCOUNTER — Ambulatory Visit: Payer: Medicare Other | Admitting: Orthopedic Surgery

## 2019-12-25 ENCOUNTER — Encounter: Payer: Self-pay | Admitting: *Deleted

## 2019-12-25 ENCOUNTER — Other Ambulatory Visit: Payer: Self-pay | Admitting: *Deleted

## 2019-12-25 NOTE — Patient Outreach (Addendum)
[facility]) Care Management  12/25/2019  [name] Jun 07, 1933 [id number]    CSW made a fourth and final attempt to try and contact patient, and patient's wife, [name] today, to perform the initial phone assessment, as well as assess and assist with social work needs and services; however, neither were available at the time of CSW's call.  CSW has left HIPAA compliant messages on voicemail for patient and [name], while awaiting a return call.  CSW will proceed with case closure, as required number of phone attempts have been made and a Patient Unsuccessful Outreach Letter was mailed to patient's home, allowing a total of 4 weeks for patient and/or [name] to respond if they were interested in receiving social work services and resources through [place] with Scientist, clinical (histocompatibility and immunogenetics).  CSW will notify patient's Primary Care Physician, Dr. [name] of CSW's plans to close patient's case, as well as send a Physician Case Closure Letter.  CSW will also mail a Case Closure Letter to patient's home.  Last, CSW will route this note to Raina Mina, patient's RNCM, and individual placing the referral, as well as Emelia Loron, [place], both with [facility].  [name], BSW, MSW, LCSW  Licensed Education officer, environmental Health System  Mailing [place] [address] [place], [place] [address] Physical Address-[address], [place], [place] [address] Toll Free Main # [phone] Fax # [phone] Cell # [phone]  [email or web address]

## 2019-12-29 ENCOUNTER — Ambulatory Visit (INDEPENDENT_AMBULATORY_CARE_PROVIDER_SITE_OTHER): Payer: Medicare Other | Admitting: Orthopedic Surgery

## 2019-12-29 ENCOUNTER — Encounter: Payer: Self-pay | Admitting: Orthopedic Surgery

## 2019-12-29 VITALS — Ht 69.0 in | Wt 155.0 lb

## 2019-12-29 DIAGNOSIS — Z89511 Acquired absence of right leg below knee: Secondary | ICD-10-CM

## 2019-12-29 NOTE — Progress Notes (Signed)
Office Visit Note   Patient: [name]           Date of Birth: 10/27/1933           MRN: [id number] Visit Date: 12/29/2019              Requested by: [name], [place],  [place] [address] PCP: [name], MD  Chief Complaint  Patient presents with  . Right Leg - Follow-up    01/23/19 revision right BKA      HPI: Patient is status post revision right below-knee amputation.  He is wearing his shrinker.  He has a small area at the tip of the leg and with a spot of drainage otherwise he is feeling better. Not currently using Trental as it caused significant GI upset Assessment & Plan: Visit Diagnoses: No diagnosis found.  Plan: Continue daily cleansing and application of wound shrinker follow-up in 2 to 3 weeks  Follow-Up Instructions: No follow-ups on file.   Ortho Exam  Patient is alert, oriented, no adenopathy, well-dressed, normal affect, normal respiratory effort. Focused examination demonstrates no cellulitis.  He does have some superficial skin abrasions proximally but is not having any drainage.  At the tip of the stump he has 1 area of necrosis approximately 3 cm in diameter.  Does not probe deeply is less painful in the past  Imaging: No results found. No images are attached to the encounter.  Labs: Lab Results  Component Value Date   HGBA1C 5.7 (H) 03/26/2018   HGBA1C 5.0 07/12/2017   HGBA1C 5.8 (H) 07/06/2015   ESRSEDRATE 24 (H) 03/24/2018   ESRSEDRATE 15 04/18/2017   ESRSEDRATE 5 09/17/2007   CRP 3.1 (H) 05/11/2018   CRP 1.8 (H) 03/24/2018   CRP <0.8 04/24/2017   LABURIC 6.0 03/09/2016   REPTSTATUS 07/12/2019 FINAL 07/10/2019   GRAMSTAIN  07/15/2017    WBC PRESENT,BOTH PMN AND MONONUCLEAR NO ORGANISMS SEEN Performed at [facility], [place] [address], [place], [place] [address]    CULT >=100,000 COLONIES/mL ESCHERICHIA COLI (A) 07/10/2019   LABORGA ESCHERICHIA COLI (A) 07/10/2019     Lab Results    Component Value Date   ALBUMIN 3.6 10/16/2019   ALBUMIN 3.6 12/20/2018   ALBUMIN 3.0 (L) 05/09/2018   PREALBUMIN 36 (H) 11/22/2017   PREALBUMIN 28.8 04/24/2017   LABURIC 6.0 03/09/2016    Lab Results  Component Value Date   MG 2.0 05/12/2018   MG 1.9 05/11/2018   MG 1.9 03/31/2018   No results found for: VD25OH  Lab Results  Component Value Date   PREALBUMIN 36 (H) 11/22/2017   PREALBUMIN 28.8 04/24/2017   CBC EXTENDED Latest Ref Rng & Units 10/16/2019 07/10/2019 01/23/2019  WBC 3.4 - 10.8 x10E3/uL 6.3 8.5 7.3  RBC 4.14 - 5.80 x10E6/uL 3.27(L) 3.51(L) 3.80(L)  HGB 13.0 - 17.7 g/dL 11.0(L) 10.9(L) 12.4(L)  HCT 37.5 - 51.0 % 33.0(L) 35.5(L) 41.2  PLT 150 - 450 x10E3/uL 95(LL) 117(L) 135(L)  NEUTROABS 1.7 - 7.7 K/uL - 6.2 -  LYMPHSABS 0.7 - 4.0 K/uL - 1.1 -     Body mass index is 22.89 kg/m.  Orders:  No orders of the defined types were placed in this encounter.  No orders of the defined types were placed in this encounter.    Procedures: No procedures performed  Clinical Data: No additional findings.  ROS:  All other systems negative, except as noted in the HPI. Review of Systems  Objective:  Vital Signs: Ht 5\' 9"  (1.753 m)   Wt 155 lb (70.3 kg)   BMI 22.89 kg/m   Specialty Comments:  No specialty comments available.  PMFS History: Patient Active Problem List   Diagnosis Date Noted  . Permanent atrial fibrillation ([place]) 10/24/2019  . Hypercholesterolemia 10/24/2019  . Current chronic use of systemic steroids 10/24/2019  . Macrocytic anemia 10/24/2019  . Dehiscence of amputation stump ([place])   . Wound infection 05/09/2018  . AKI (acute kidney injury) ([place])   . S/P BKA (below knee amputation) unilateral, right ([place])   . Below-knee amputation with complication, initial encounter ([place])   . Tachypnea   . Post-operative pain   . Subacute osteomyelitis, right ankle and foot ([place])   . Paralysis of right lower extremity ([place])   . TIA (transient ischemic  attack) 03/24/2018  . Encephalopathy 03/24/2018  . Cellulitis 03/15/2018  . Hypernatremia 03/15/2018  . Persistent atrial fibrillation ([place]) 11/10/2017  . Severe muscle deconditioning 11/10/2017  . Hemorrhage 10/22/2017  . Coronary artery disease involving native coronary artery of native heart without angina pectoris 10/22/2017  . Acute hypoxemic respiratory failure ([place]) 07/17/2017  . Aspiration syndrome, subsequent encounter 07/11/2017  . Aspiration pneumonia ([place]) 07/01/2017  . Acute encephalopathy 07/01/2017  . Thrombocytopenia ([place]) 07/01/2017  . Chronic diastolic (congestive) heart failure ([place]) 07/01/2017  . Anemia of chronic disease 07/01/2017  . Troponin level elevated 07/01/2017  . Chronic atrial fibrillation 07/01/2017  . Goals of care, counseling/discussion   . Palliative care by specialist   . Acute metabolic encephalopathy 76/54/6503  . Stage 3a chronic kidney disease   . CAP (community acquired pneumonia) 04/17/2017  . Hallux rigidus, right foot 03/10/2016  . Lumbosacral spondylosis without myelopathy 10/29/2015  . Memory difficulty 09/22/2015  . Abnormality of gait 09/22/2015  . Hyperglycemia 07/06/2015  . Chronic pain 07/06/2015  . Chronic insomnia 03/30/2015  . Transient alteration of awareness 03/30/2015  . Abnormal liver function   . Altered mental status 01/31/2015  . Essential hypertension 01/31/2015  . Constipation 01/31/2015  . Hypothyroidism 01/31/2015  . Seizure disorder ([place]) 01/31/2015  . Bladder outlet obstruction 01/31/2015  . GERD (gastroesophageal reflux disease) 01/31/2015  . Chronic back pain 01/31/2015  . Acute kidney injury ([place]) 01/31/2015   Past Medical History:  Diagnosis Date  . Abnormality of gait 09/22/2015  . Arthritis   . Atrial fibrillation ([place])   . CAD (coronary artery disease)    Stent to RCA, Penta stent, 99% reduced to 0% 2002.  . Cancer ([place])    skin CA removed from back  . Chronic insomnia 03/30/2015  . Complication  of anesthesia    trouble waking up  . GERD (gastroesophageal reflux disease)   . Hypercholesteremia   . Hypertension   . Hypothyroidism   . Memory difficulty 09/22/2015  . Osteoarthritis   . Pneumonia   . Seizures ([place])   . Sepsis ([place]) 05/2017  . Transient alteration of awareness 03/30/2015  . Vertigo    hx of    Family History  Problem Relation Age of Onset  . Hypertension Mother   . Cancer Mother   . Kidney failure Father   . Heart disease Father     Past Surgical History:  Procedure Laterality Date  . AMPUTATION Right 03/28/2018   Procedure: AMPUTATION BELOW KNEE;  Surgeon: Newt Minion, MD;  Location: [facility];  Service: Orthopedics;  Laterality: Right;  . APPLICATION OF WOUND VAC Right 01/23/2019   Procedure: Application Of  Prevena Wound  Vac;  Surgeon: Newt Minion, MD;  Location: [facility];  Service: Orthopedics;  Laterality: Right;  . BACK SURGERY    . EYE SURGERY     Bilateral Cataract surgery   . HERNIA REPAIR    . I & D EXTREMITY Right 05/10/2015   Procedure: IRRIGATION AND DEBRIDEMENT EXTREMITY;  Surgeon: Leanora Cover, MD;  Location: [facility];  Service: Orthopedics;  Laterality: Right;  . KNEE ARTHROPLASTY     right knee X 2; left knee once  . LAMINECTOMY     X 6  . LEG AMPUTATION BELOW KNEE Right 03/28/2018  . POSTERIOR CERVICAL FUSION/FORAMINOTOMY  01/28/2012   Procedure: POSTERIOR CERVICAL FUSION/FORAMINOTOMY LEVEL 3;  Surgeon: [name], MD;  Location: [facility];  Service: Neurosurgery;  Laterality: Left;  Posterior Cervical Five-Thoracic One Fusion, Arthrodesis with LEFT Cervical Seven-thoracic One Laminectomy, Foraminotomy and Resection of Synovial Cyst  . POSTERIOR CERVICAL FUSION/FORAMINOTOMY N/A 01/29/2013   Procedure: POSTERIOR CERVICAL FUSION/FORAMINOTOMY LEVEL 1 and C2-5 Posteriolateral Arthrodesis;  Surgeon: [name], MD;  Location: [facility];  Service: Neurosurgery;  Laterality: N/A;  C2-C3 Laminectomy C2-C3  posterior cervical arthrodesis  . STUMP REVISION Right 01/23/2019   Procedure: REVISION RIGHT BELOW KNEE AMPUTATION;  Surgeon: Newt Minion, MD;  Location: [facility];  Service: Orthopedics;  Laterality: Right;  . TONSILLECTOMY     Social History   Occupational History  . Occupation: retired Software engineer  Tobacco Use  . Smoking status: Former Research scientist (life sciences)  . Smokeless tobacco: Never Used  Vaping Use  . Vaping Use: Never used  Substance and Sexual Activity  . Alcohol use: Not Currently    Comment: rare  . Drug use: No  . Sexual activity: Not Currently

## 2020-01-05 ENCOUNTER — Encounter: Payer: Self-pay | Admitting: *Deleted

## 2020-01-05 ENCOUNTER — Other Ambulatory Visit: Payer: Self-pay | Admitting: *Deleted

## 2020-01-05 NOTE — Patient Outreach (Signed)
[facility]) Care Management  01/05/2020  [name] April 23, 1933 [id number]   CSW received an incoming call from patient's wife, Amore Grater today, in response to the Patient Unsuccessful Outreach Letter that CSW mailed to their home.  CSW was able to perform the initial phone assessment on patient, as well as assess and assist with social work needs and services.  CSW introduced self, explained role and types of services provided through [facility] ([facility]).  CSW further explained to Mrs. Posada that CSW works with Emelia Loron, patient's Chronic Special Needs Plan Coordinator, also with [facility].  CSW then explained the reason for CSW's previous call attempts, indicating that Mrs. Wine thought that patient would benefit from social work services and resources to assist with arranging in-home care and respite services.  CSW obtained two HIPAA compliant identifiers from Mrs. Roddey, which included patient's name and date of birth.  Mrs. Galvan admitted that she could benefit from some help with patient in the home, indicating that she has already called around to several home health agencies and private agency sitters, but has been unsuccessful in getting services established.  Patient stated, "The agencies I have called require that I hire them for a minimum of 5 hours per visit, for at least 3 visits per week, or they tell me that they simply do not have the staff available to provide the services".  CSW agreed to assist Mrs. Kwan with arranging in-home care services, providing her with detailed information about Aid and Attendance Benefits through [employer], and the process for applying for services.  Mrs. Boal reported that she has been providing total care and supervision of patient for the last 5 1/2 years, neglecting her own health and feeling guilty when she has to leave him at home alone for any length of time.    CSW also agreed to mail Mrs. Rogel a packet of resource information, and applications, to apply for services, which include all of the following:  How to Apply for Aid and Attendance - [employer]; Hydrographic surveyor for Aid and [place]; Dispensing optician in Music therapist for Aid and [place]; Authorization to [employer] for Aid and [place]; In-Home Care and Respite Agencies; [place]; Personal Care Service Providers.  CSW will then follow-up with Mrs. Springs again next week, on Tuesday, January 12, 2020, around 10:00am, to ensure that she received the packet, as well as assist with completion of applications, if necessary.  CSW was able to confirm that Mrs. Tawil has the correct contact information for CSW, encouraging her to contact CSW directly if she has questions or if additional social work needs arise in the meantime.  [name], BSW, MSW, LCSW  Licensed Education officer, environmental Health System  Mailing [place] [address] [place], [place] [address] Physical Address-[address] [place], [place] [address] Toll Free Main # [phone] Fax # [phone] Cell # [phone]  [email or web address]

## 2020-01-07 ENCOUNTER — Encounter (HOSPITAL_COMMUNITY): Payer: Self-pay

## 2020-01-07 ENCOUNTER — Emergency Department (HOSPITAL_COMMUNITY): Payer: Medicare Other

## 2020-01-07 ENCOUNTER — Inpatient Hospital Stay (HOSPITAL_COMMUNITY)
Admission: EM | Admit: 2020-01-07 | Discharge: 2020-01-11 | DRG: 378 | Disposition: A | Discharge status: Home Health | Payer: Medicare Other | Attending: Family Medicine | Admitting: Family Medicine

## 2020-01-07 ENCOUNTER — Other Ambulatory Visit: Payer: Self-pay

## 2020-01-07 DIAGNOSIS — G40909 Epilepsy, unspecified, not intractable, without status epilepticus: Secondary | ICD-10-CM

## 2020-01-07 DIAGNOSIS — Z841 Family history of disorders of kidney and ureter: Secondary | ICD-10-CM

## 2020-01-07 DIAGNOSIS — K264 Chronic or unspecified duodenal ulcer with hemorrhage: Secondary | ICD-10-CM | POA: Diagnosis present

## 2020-01-07 DIAGNOSIS — I482 Chronic atrial fibrillation, unspecified: Secondary | ICD-10-CM | POA: Diagnosis present

## 2020-01-07 DIAGNOSIS — D539 Nutritional anemia, unspecified: Secondary | ICD-10-CM | POA: Diagnosis present

## 2020-01-07 DIAGNOSIS — G8929 Other chronic pain: Secondary | ICD-10-CM | POA: Diagnosis present

## 2020-01-07 DIAGNOSIS — I951 Orthostatic hypotension: Secondary | ICD-10-CM | POA: Diagnosis present

## 2020-01-07 DIAGNOSIS — F0390 Unspecified dementia without behavioral disturbance: Secondary | ICD-10-CM | POA: Diagnosis present

## 2020-01-07 DIAGNOSIS — I4821 Permanent atrial fibrillation: Secondary | ICD-10-CM | POA: Diagnosis present

## 2020-01-07 DIAGNOSIS — Z8701 Personal history of pneumonia (recurrent): Secondary | ICD-10-CM

## 2020-01-07 DIAGNOSIS — K449 Diaphragmatic hernia without obstruction or gangrene: Secondary | ICD-10-CM | POA: Diagnosis present

## 2020-01-07 DIAGNOSIS — R9431 Abnormal electrocardiogram [ECG] [EKG]: Secondary | ICD-10-CM

## 2020-01-07 DIAGNOSIS — I739 Peripheral vascular disease, unspecified: Secondary | ICD-10-CM | POA: Diagnosis present

## 2020-01-07 DIAGNOSIS — N179 Acute kidney failure, unspecified: Secondary | ICD-10-CM | POA: Diagnosis present

## 2020-01-07 DIAGNOSIS — K922 Gastrointestinal hemorrhage, unspecified: Secondary | ICD-10-CM | POA: Diagnosis present

## 2020-01-07 DIAGNOSIS — Z66 Do not resuscitate: Secondary | ICD-10-CM | POA: Diagnosis present

## 2020-01-07 DIAGNOSIS — R710 Precipitous drop in hematocrit: Secondary | ICD-10-CM | POA: Diagnosis not present

## 2020-01-07 DIAGNOSIS — T8789 Other complications of amputation stump: Secondary | ICD-10-CM | POA: Diagnosis present

## 2020-01-07 DIAGNOSIS — Z809 Family history of malignant neoplasm, unspecified: Secondary | ICD-10-CM

## 2020-01-07 DIAGNOSIS — Z89511 Acquired absence of right leg below knee: Secondary | ICD-10-CM | POA: Diagnosis not present

## 2020-01-07 DIAGNOSIS — N1831 Chronic kidney disease, stage 3a: Secondary | ICD-10-CM | POA: Diagnosis present

## 2020-01-07 DIAGNOSIS — E039 Hypothyroidism, unspecified: Secondary | ICD-10-CM | POA: Diagnosis present

## 2020-01-07 DIAGNOSIS — I5032 Chronic diastolic (congestive) heart failure: Secondary | ICD-10-CM | POA: Diagnosis present

## 2020-01-07 DIAGNOSIS — Z96652 Presence of left artificial knee joint: Secondary | ICD-10-CM | POA: Diagnosis present

## 2020-01-07 DIAGNOSIS — Z20822 Contact with and (suspected) exposure to covid-19: Secondary | ICD-10-CM | POA: Diagnosis present

## 2020-01-07 DIAGNOSIS — D696 Thrombocytopenia, unspecified: Secondary | ICD-10-CM | POA: Diagnosis present

## 2020-01-07 DIAGNOSIS — R778 Other specified abnormalities of plasma proteins: Secondary | ICD-10-CM

## 2020-01-07 DIAGNOSIS — D62 Acute posthemorrhagic anemia: Secondary | ICD-10-CM

## 2020-01-07 DIAGNOSIS — T39395A Adverse effect of other nonsteroidal anti-inflammatory drugs [NSAID], initial encounter: Secondary | ICD-10-CM | POA: Diagnosis present

## 2020-01-07 DIAGNOSIS — Y835 Amputation of limb(s) as the cause of abnormal reaction of the patient, or of later complication, without mention of misadventure at the time of the procedure: Secondary | ICD-10-CM | POA: Diagnosis present

## 2020-01-07 DIAGNOSIS — Z955 Presence of coronary angioplasty implant and graft: Secondary | ICD-10-CM

## 2020-01-07 DIAGNOSIS — R7989 Other specified abnormal findings of blood chemistry: Secondary | ICD-10-CM

## 2020-01-07 DIAGNOSIS — Z7982 Long term (current) use of aspirin: Secondary | ICD-10-CM

## 2020-01-07 DIAGNOSIS — I248 Other forms of acute ischemic heart disease: Secondary | ICD-10-CM | POA: Diagnosis present

## 2020-01-07 DIAGNOSIS — N5082 Scrotal pain: Secondary | ICD-10-CM | POA: Diagnosis present

## 2020-01-07 DIAGNOSIS — I10 Essential (primary) hypertension: Secondary | ICD-10-CM | POA: Diagnosis present

## 2020-01-07 DIAGNOSIS — Z01810 Encounter for preprocedural cardiovascular examination: Secondary | ICD-10-CM | POA: Diagnosis not present

## 2020-01-07 DIAGNOSIS — I2510 Atherosclerotic heart disease of native coronary artery without angina pectoris: Secondary | ICD-10-CM | POA: Diagnosis present

## 2020-01-07 DIAGNOSIS — Z8249 Family history of ischemic heart disease and other diseases of the circulatory system: Secondary | ICD-10-CM

## 2020-01-07 DIAGNOSIS — E785 Hyperlipidemia, unspecified: Secondary | ICD-10-CM | POA: Diagnosis present

## 2020-01-07 DIAGNOSIS — I130 Hypertensive heart and chronic kidney disease with heart failure and stage 1 through stage 4 chronic kidney disease, or unspecified chronic kidney disease: Secondary | ICD-10-CM | POA: Diagnosis present

## 2020-01-07 DIAGNOSIS — R0789 Other chest pain: Secondary | ICD-10-CM

## 2020-01-07 HISTORY — DX: Gastrointestinal hemorrhage, unspecified: K92.2

## 2020-01-07 LAB — COMPREHENSIVE METABOLIC PANEL
ALT: 11 U/L (ref 0–44)
AST: 19 U/L (ref 15–41)
Albumin: 3.2 g/dL — ABNORMAL LOW (ref 3.5–5.0)
Alkaline Phosphatase: 52 U/L (ref 38–126)
Anion gap: 12 (ref 5–15)
BUN: 49 mg/dL — ABNORMAL HIGH (ref 8–23)
CO2: 23 mmol/L (ref 22–32)
Calcium: 9 mg/dL (ref 8.9–10.3)
Chloride: 106 mmol/L (ref 98–111)
Creatinine, Ser: 2.03 mg/dL — ABNORMAL HIGH (ref 0.61–1.24)
GFR, Estimated: 32 mL/min — ABNORMAL LOW (ref >60)
Glucose, Bld: 92 mg/dL (ref 70–99)
Potassium: 4.4 mmol/L (ref 3.5–5.1)
Sodium: 141 mmol/L (ref 135–145)
Total Bilirubin: 0.9 mg/dL (ref 0.3–1.2)
Total Protein: 5.2 g/dL — ABNORMAL LOW (ref 6.5–8.1)

## 2020-01-07 LAB — TROPONIN I (HIGH SENSITIVITY)
Troponin I (High Sensitivity): 151 ng/L (ref <18)
Troponin I (High Sensitivity): 157 ng/L (ref <18)

## 2020-01-07 LAB — CBC
HCT: 29.1 % — ABNORMAL LOW (ref 39.0–52.0)
HCT: 27.5 % — ABNORMAL LOW (ref 39.0–52.0)
Hemoglobin: 8.6 g/dL — ABNORMAL LOW (ref 13.0–17.0)
Hemoglobin: 8.4 g/dL — ABNORMAL LOW (ref 13.0–17.0)
MCH: 32.7 pg (ref 26.0–34.0)
MCH: 32.1 pg (ref 26.0–34.0)
MCHC: 29.6 g/dL — ABNORMAL LOW (ref 30.0–36.0)
MCHC: 30.5 g/dL (ref 30.0–36.0)
MCV: 110.6 fL — ABNORMAL HIGH (ref 80.0–100.0)
MCV: 105 fL — ABNORMAL HIGH (ref 80.0–100.0)
Platelets: 127 10*3/uL — ABNORMAL LOW (ref 150–400)
Platelets: 123 10*3/uL — ABNORMAL LOW (ref 150–400)
RBC: 2.63 MIL/uL — ABNORMAL LOW (ref 4.22–5.81)
RBC: 2.62 MIL/uL — ABNORMAL LOW (ref 4.22–5.81)
RDW: 14.5 % (ref 11.5–15.5)
RDW: 14.6 % (ref 11.5–15.5)
WBC: 10.7 10*3/uL — ABNORMAL HIGH (ref 4.0–10.5)
WBC: 9 10*3/uL (ref 4.0–10.5)
nRBC: 0 % (ref 0.0–0.2)
nRBC: 0 % (ref 0.0–0.2)

## 2020-01-07 LAB — CBG MONITORING, ED: Glucose-Capillary: 89 mg/dL (ref 70–99)

## 2020-01-07 LAB — PROTIME-INR
INR: 1.2 (ref 0.8–1.2)
Prothrombin Time: 14.3 seconds (ref 11.4–15.2)

## 2020-01-07 LAB — BRAIN NATRIURETIC PEPTIDE: B Natriuretic Peptide: 519.4 pg/mL — ABNORMAL HIGH (ref 0.0–100.0)

## 2020-01-07 LAB — RESPIRATORY PANEL BY RT PCR (FLU A&B, COVID)
Influenza A by PCR: NEGATIVE
Influenza B by PCR: NEGATIVE
SARS Coronavirus 2 by RT PCR: NEGATIVE

## 2020-01-07 LAB — MAGNESIUM: Magnesium: 2.1 mg/dL (ref 1.7–2.4)

## 2020-01-07 LAB — POC OCCULT BLOOD, ED: Fecal Occult Bld: POSITIVE — AB

## 2020-01-07 MED ORDER — DILTIAZEM HCL ER COATED BEADS 120 MG PO CP24
240.0000 mg | ORAL_CAPSULE | Freq: Every day | ORAL | Status: DC
  Administered 2020-01-07 – 2020-01-11 (×5): 240 mg via ORAL
  Filled 2020-01-07 (×2): qty 2
  Filled 2020-01-07: qty 1
  Filled 2020-01-07 (×2): qty 2

## 2020-01-07 MED ORDER — ACETAMINOPHEN 325 MG PO TABS
650.0000 mg | ORAL_TABLET | Freq: Four times a day (QID) | ORAL | Status: DC | PRN
  Filled 2020-01-07: qty 2

## 2020-01-07 MED ORDER — HYDRALAZINE HCL 20 MG/ML IJ SOLN: 5.0000 mg | INTRAMUSCULAR | Status: DC | PRN

## 2020-01-07 MED ORDER — HYDROCODONE-ACETAMINOPHEN 10-325 MG PO TABS
1.0000 | ORAL_TABLET | ORAL | Status: DC | PRN
  Administered 2020-01-08 – 2020-01-11 (×10): 1 via ORAL
  Filled 2020-01-07 (×11): qty 1

## 2020-01-07 MED ORDER — PRAVASTATIN SODIUM 40 MG PO TABS
40.0000 mg | ORAL_TABLET | Freq: Every evening | ORAL | Status: DC
  Administered 2020-01-07 – 2020-01-11 (×5): 40 mg via ORAL
  Filled 2020-01-07 (×5): qty 1

## 2020-01-07 MED ORDER — NITROFURANTOIN MACROCRYSTAL 100 MG PO CAPS: 100.0000 mg | ORAL_CAPSULE | Freq: Every day | ORAL | Status: DC

## 2020-01-07 MED ORDER — HYDROCODONE-ACETAMINOPHEN 5-325 MG PO TABS: 1.0000 | ORAL_TABLET | Freq: Once | ORAL | Status: DC

## 2020-01-07 MED ORDER — SODIUM CHLORIDE 0.9 % IV SOLN
80.0000 mg | Freq: Once | INTRAVENOUS | Status: AC
  Administered 2020-01-07: 80 mg via INTRAVENOUS
  Filled 2020-01-07: qty 80

## 2020-01-07 MED ORDER — ONDANSETRON HCL 4 MG PO TABS: 4.0000 mg | ORAL_TABLET | Freq: Four times a day (QID) | ORAL | Status: DC | PRN

## 2020-01-07 MED ORDER — PREDNISONE 5 MG PO TABS
9.0000 mg | ORAL_TABLET | Freq: Every day | ORAL | Status: DC
  Administered 2020-01-07: 9 mg via ORAL
  Administered 2020-01-08: 8 mg via ORAL
  Administered 2020-01-09: 9 mg via ORAL
  Filled 2020-01-07 (×4): qty 4

## 2020-01-07 MED ORDER — SODIUM CHLORIDE 0.9% FLUSH
3.0000 mL | Freq: Two times a day (BID) | INTRAVENOUS | Status: DC
  Administered 2020-01-07 – 2020-01-11 (×6): 3 mL via INTRAVENOUS

## 2020-01-07 MED ORDER — LEVOTHYROXINE SODIUM 75 MCG PO TABS
150.0000 ug | ORAL_TABLET | Freq: Every day | ORAL | Status: DC
  Administered 2020-01-08 – 2020-01-11 (×4): 150 ug via ORAL
  Filled 2020-01-07 (×4): qty 2

## 2020-01-07 MED ORDER — HYDROMORPHONE HCL 1 MG/ML IJ SOLN
0.5000 mg | Freq: Once | INTRAMUSCULAR | Status: AC
  Administered 2020-01-07: 0.5 mg via INTRAVENOUS
  Filled 2020-01-07: qty 1

## 2020-01-07 MED ORDER — MORPHINE SULFATE (PF) 2 MG/ML IV SOLN: 2.0000 mg | INTRAVENOUS | Status: DC | PRN

## 2020-01-07 MED ORDER — ONDANSETRON HCL 4 MG/2ML IJ SOLN: 4.0000 mg | Freq: Four times a day (QID) | INTRAMUSCULAR | Status: DC | PRN

## 2020-01-07 MED ORDER — SODIUM CHLORIDE 0.9 % IV SOLN
8.0000 mg/h | INTRAVENOUS | Status: DC
  Administered 2020-01-07 – 2020-01-09 (×5): 8 mg/h via INTRAVENOUS
  Filled 2020-01-07 (×8): qty 80

## 2020-01-07 MED ORDER — DIVALPROEX SODIUM ER 250 MG PO TB24
250.0000 mg | ORAL_TABLET | Freq: Two times a day (BID) | ORAL | Status: DC
  Administered 2020-01-07 – 2020-01-11 (×9): 250 mg via ORAL
  Filled 2020-01-07 (×10): qty 1

## 2020-01-07 MED ORDER — ACETAMINOPHEN 650 MG RE SUPP: 650.0000 mg | Freq: Four times a day (QID) | RECTAL | Status: DC | PRN

## 2020-01-07 MED ORDER — METOPROLOL TARTRATE 25 MG PO TABS
25.0000 mg | ORAL_TABLET | Freq: Two times a day (BID) | ORAL | Status: DC
  Administered 2020-01-07 – 2020-01-11 (×9): 25 mg via ORAL
  Filled 2020-01-07 (×9): qty 1

## 2020-01-07 MED ORDER — LACTATED RINGERS IV SOLN: INTRAVENOUS | Status: DC

## 2020-01-07 MED ORDER — DOXAZOSIN MESYLATE 2 MG PO TABS
4.0000 mg | ORAL_TABLET | Freq: Every day | ORAL | Status: DC
  Administered 2020-01-07 – 2020-01-10 (×4): 4 mg via ORAL
  Filled 2020-01-07 (×2): qty 2
  Filled 2020-01-07: qty 1
  Filled 2020-01-07 (×2): qty 2

## 2020-01-07 NOTE — ED Notes (Signed)
Pa notified of Critical troponin 151

## 2020-01-07 NOTE — ED Provider Notes (Signed)
[facility] EMERGENCY DEPARTMENT Provider Note   CSN: [id number] Arrival date & time: 01/07/20  0443     History Chief Complaint  Patient presents with  . Chest Pain    [name] is a 86 y.o. male with a history of CAD (PCI-RAD 0321), diastolic heart failure, severe orthostatic hypotension, frequent episodes of acute on chronic renal insufficiency (baseline CKD 3) chronic atrial fibrillation not on anticoagulation, and right BKA, and chronic respiratory failure on 2 L home O2 who presents the emergency department by EMS with a chief complaint of chest pain.  The patient reports that he had developed gradual onset central, nonradiating chest pain that he characterizes as squeezing that began at approximately 19:30-20:30, shortly after eating dinner and when he was trying to go to bed.  He reports that he ultimately was never able to fall asleep.  He attempted to treat his symptoms by taking his home Norco as well as NTG x2.  He reports no improvement in his symptoms with NTG.  EMS also administered sublingual NTG in route and ASA. Patient reports no improvement in his symptoms.  He rates his current pain is 3 out of 10.  Prior to the onset of pain tonight, he reports that he had a similar episode of pain approximately 24 hours ago that was present when he went to bed and spontaneously resolved when he awoke this morning.  He denies diaphoresis, dizziness, lightheadedness, palpitations, abdominal pain, nausea, vomiting, diarrhea, visual changes, dyspepsia, pain in his neck or down his arms.  Per the patient's wife, she reports that he has had very dark stools over the last few days.  No hematochezia.  No history of GI bleed.  However, patient is not currently anticoagulated as he developed a spontaneous bleed in his left leg and dropped his hemoglobin substantially while he was on Eliquis.  He is now only taking an 81 mg aspirin.  Cardiologist: Dr. Sallyanne Kuster.   The  history is provided by the patient and medical records. No language interpreter was used.       Past Medical History:  Diagnosis Date  . Abnormality of gait 09/22/2015  . Arthritis   . Atrial fibrillation ([place])   . CAD (coronary artery disease)    Stent to RCA, Penta stent, 99% reduced to 0% 2002.  . Cancer ([place])    skin CA removed from back  . Chronic insomnia 03/30/2015  . Complication of anesthesia    trouble waking up  . GERD (gastroesophageal reflux disease)   . Hypercholesteremia   . Hypertension   . Hypothyroidism   . Memory difficulty 09/22/2015  . Osteoarthritis   . Pneumonia   . Seizures ([place])   . Sepsis ([place]) 05/2017  . Transient alteration of awareness 03/30/2015  . Vertigo    hx of    Patient Active Problem List   Diagnosis Date Noted  . Acute upper GI bleeding 01/07/2020  . Permanent atrial fibrillation ([place]) 10/24/2019  . Hypercholesterolemia 10/24/2019  . Current chronic use of systemic steroids 10/24/2019  . Macrocytic anemia 10/24/2019  . Dehiscence of amputation stump ([place])   . Wound infection 05/09/2018  . AKI (acute kidney injury) ([place])   . S/P BKA (below knee amputation) unilateral, right ([place])   . Below-knee amputation with complication, initial encounter ([place])   . Tachypnea   . Post-operative pain   . Subacute osteomyelitis, right ankle and foot ([place])   . 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)   .  TIA (transient ischemic attack) 03/24/2018  . Encephalopathy 03/24/2018  . Cellulitis 03/15/2018  . Hypernatremia 03/15/2018  . Persistent atrial fibrillation ([place]) 11/10/2017  . Severe muscle deconditioning 11/10/2017  . Hemorrhage 10/22/2017  . Coronary artery disease involving native coronary artery of native heart without angina pectoris 10/22/2017  . Acute hypoxemic respiratory failure ([place]) 07/17/2017  . Aspiration syndrome, subsequent encounter 07/11/2017  . Aspiration pneumonia () 07/01/2017  . Acute encephalopathy 07/01/2017  .  Thrombocytopenia ([place]) 07/01/2017  . Chronic diastolic (congestive) heart failure ([place]) 07/01/2017  . Anemia of chronic disease 07/01/2017  . Troponin level elevated 07/01/2017  . Chronic atrial fibrillation 07/01/2017  . Goals of care, counseling/discussion   . Palliative care by specialist   . Acute metabolic encephalopathy 24/26/8341  . Stage 3a chronic kidney disease   . CAP (community acquired pneumonia) 04/17/2017  . Hallux rigidus, right foot 03/10/2016  . Lumbosacral spondylosis without myelopathy 10/29/2015  . Memory difficulty 09/22/2015  . Abnormality of gait 09/22/2015  . Hyperglycemia 07/06/2015  . Chronic pain 07/06/2015  . Chronic insomnia 03/30/2015  . Transient alteration of awareness 03/30/2015  . Abnormal liver function   . Altered mental status 01/31/2015  . Essential hypertension 01/31/2015  . Constipation 01/31/2015  . Hypothyroidism 01/31/2015  . Seizure disorder ([place]) 01/31/2015  . Bladder outlet obstruction 01/31/2015  . GERD (gastroesophageal reflux disease) 01/31/2015  . Chronic back pain 01/31/2015  . Acute kidney injury ([place]) 01/31/2015    Past Surgical History:  Procedure Laterality Date  . AMPUTATION Right 03/28/2018   Procedure: AMPUTATION BELOW KNEE;  Surgeon: Newt Minion, MD;  Location: [facility];  Service: Orthopedics;  Laterality: Right;  . APPLICATION OF WOUND VAC Right 01/23/2019   Procedure: Application Of  Prevena Wound Vac;  Surgeon: Newt Minion, MD;  Location: [facility];  Service: Orthopedics;  Laterality: Right;  . BACK SURGERY    . EYE SURGERY     Bilateral Cataract surgery   . HERNIA REPAIR    . I & D EXTREMITY Right 05/10/2015   Procedure: IRRIGATION AND DEBRIDEMENT EXTREMITY;  Surgeon: Leanora Cover, MD;  Location: [facility];  Service: Orthopedics;  Laterality: Right;  . KNEE ARTHROPLASTY     right knee X 2; left knee once  . LAMINECTOMY     X 6  . LEG AMPUTATION BELOW KNEE Right 03/28/2018  . POSTERIOR CERVICAL  FUSION/FORAMINOTOMY  01/28/2012   Procedure: POSTERIOR CERVICAL FUSION/FORAMINOTOMY LEVEL 3;  Surgeon: [name], MD;  Location: [facility];  Service: Neurosurgery;  Laterality: Left;  Posterior Cervical Five-Thoracic One Fusion, Arthrodesis with LEFT Cervical Seven-thoracic One Laminectomy, Foraminotomy and Resection of Synovial Cyst  . POSTERIOR CERVICAL FUSION/FORAMINOTOMY N/A 01/29/2013   Procedure: POSTERIOR CERVICAL FUSION/FORAMINOTOMY LEVEL 1 and C2-5 Posteriolateral Arthrodesis;  Surgeon: [name], MD;  Location: [facility];  Service: Neurosurgery;  Laterality: N/A;  C2-C3 Laminectomy C2-C3 posterior cervical arthrodesis  . STUMP REVISION Right 01/23/2019   Procedure: REVISION RIGHT BELOW KNEE AMPUTATION;  Surgeon: Newt Minion, MD;  Location: [facility];  Service: Orthopedics;  Laterality: Right;  . TONSILLECTOMY         Family History  Problem Relation Age of Onset  . Hypertension Mother   . Cancer Mother   . Kidney failure Father   . Heart disease Father     Social History   Tobacco Use  . Smoking status: Former Research scientist (life sciences)  . Smokeless tobacco: Never Used  Vaping Use  . Vaping Use:  Never used  Substance Use Topics  . Alcohol use: Not Currently    Comment: rare  . Drug use: No    Home Medications Prior to Admission medications   Medication Sig Start Date End Date Taking? Authorizing Provider  alendronate (FOSAMAX) 70 MG tablet Take 70 mg by mouth every Wednesday. Remain upright for 30-60 minutes. 01/06/12   [provider]  aspirin EC 81 MG tablet Take 81 mg by mouth daily.    [provider]  Calcium Carb-Cholecalciferol (CALCIUM 600+D) 600-800 MG-UNIT TABS Take 1 tablet by mouth daily.    [provider]  diltiazem (CARDIZEM CD) 240 MG 24 hr capsule TAKE 1 CAPSULE BY MOUTH EVERY MORNING. HOLD IF SYSTOLIC BLOOD PRESSURE IS LESS THAN 100 12/10/19   Croitoru, Mihai, MD  divalproex (DEPAKOTE ER) 250 MG 24 hr tablet Take 250-500  mg by mouth See admin instructions. Take 250 mg in the morning and 500 mg in the evening    [provider]  doxazosin (CARDURA) 4 MG tablet TAKE 1 TABLET(4 MG) BY MOUTH AT BEDTIME 12/17/19   Croitoru, Mihai, MD  furosemide (LASIX) 20 MG tablet Take 1 tablet (20 mg total) by mouth daily. 07/20/19 10/18/19  Croitoru, Mihai, MD  HYDROcodone-acetaminophen (NORCO) 10-325 MG tablet Take 1 tablet by mouth every 4 (four) hours as needed for pain. 06/23/19   [provider]  ketoconazole (NIZORAL) 2 % shampoo Apply 1 application topically 2 (two) times a week. 06/20/19   [provider]  levothyroxine (SYNTHROID, LEVOTHROID) 150 MCG tablet Take 150 mcg by mouth daily before breakfast. 12/27/11   [provider]  lidocaine (LIDODERM) 5 % Place 1 patch onto the skin daily as needed (pain). Remove & Discard patch within 12 hours or as directed by MD    [provider]  meloxicam (MOBIC) 15 MG tablet Take 15 mg by mouth daily. 10/15/19   [provider]  metoprolol tartrate (LOPRESSOR) 25 MG tablet TAKE 1 TABLET BY MOUTH TWICE DAILY WITH A MEAL OR IMMEDIATELY AFTER 08/06/19   Croitoru, Mihai, MD  Multiple Vitamins-Minerals (CERTAVITE/ANTIOXIDANTS) TABS Take 1 tablet by mouth every evening.     [provider]  mupirocin ointment (BACTROBAN) 2 % Apply 1 application topically daily. Apply to right lateral leg wound daily 09/30/19   Edrick Kins, DPM  nitrofurantoin (MACRODANTIN) 100 MG capsule Take 100 mg by mouth daily. 09/30/19   [provider]  nitroGLYCERIN (NITROSTAT) 0.4 MG SL tablet Place 1 tablet (0.4 mg total) under the tongue every 5 (five) minutes as needed for chest pain (call 911 afer 3 doses and if chest pain persists). 06/19/18   Croitoru, Mihai, MD  oxymetazoline (AFRIN) 0.05 % nasal spray Place 1 spray into both nostrils 2 (two) times daily as needed for congestion.    [provider]  pentoxifylline (TRENTAL) 400 MG CR tablet  Take 1 tablet (400 mg total) by mouth 3 (three) times daily with meals. 12/03/19   Persons, Bevely Palmer, PA  potassium chloride SA (KLOR-CON) 20 MEQ tablet Take 0.5 tablets (10 mEq total) by mouth daily. 10/22/19   Croitoru, Mihai, MD  pravastatin (PRAVACHOL) 40 MG tablet Take 40 mg by mouth every evening.  10/29/14   [provider]  predniSONE (DELTASONE) 1 MG tablet Take 9 mg by mouth daily. 06/17/19   [provider]    Allergies    Eliquis [apixaban], Demerol [meperidine], Keppra [levetiracetam], and Vimpat [lacosamide]  Review of Systems   Review of  Systems  Constitutional: Negative for appetite change, chills, diaphoresis and fever.  HENT: Negative for congestion and sore throat.   Eyes: Negative for visual disturbance.  Respiratory: Negative for cough, shortness of breath and wheezing.   Cardiovascular: Positive for chest pain.  Gastrointestinal: Positive for blood in stool. Negative for abdominal pain, diarrhea, nausea and vomiting.  Genitourinary: Negative for dysuria, frequency, hematuria and urgency.  Musculoskeletal: Negative for arthralgias, back pain, myalgias, neck pain and neck stiffness.  Skin: Negative for rash and wound.  Allergic/Immunologic: Negative for immunocompromised state.  Neurological: Negative for seizures, syncope, weakness, numbness and headaches.  Hematological: Bruises/bleeds easily.  Psychiatric/Behavioral: Negative for confusion.    Physical Exam Updated Vital Signs BP (!) 160/94   Pulse 86   Temp 97.7 F (36.5 C) (Oral)   Resp 13   SpO2 100%   Physical Exam  ED Results / Procedures / Treatments   Labs (all labs ordered are listed, but only abnormal results are displayed) Labs Reviewed  CBC - Abnormal; Notable for the following components:      Result Value   WBC 10.7 (*)    RBC 2.63 (*)    Hemoglobin 8.6 (*)    HCT 29.1 (*)    MCV 110.6 (*)    MCHC 29.6 (*)    Platelets 127 (*)    All other components within normal  limits  COMPREHENSIVE METABOLIC PANEL - Abnormal; Notable for the following components:   BUN 49 (*)    Creatinine, Ser 2.03 (*)    Total Protein 5.2 (*)    Albumin 3.2 (*)    GFR, Estimated 32 (*)    All other components within normal limits  BRAIN NATRIURETIC PEPTIDE - Abnormal; Notable for the following components:   B Natriuretic Peptide 519.4 (*)    All other components within normal limits  POC OCCULT BLOOD, ED - Abnormal; Notable for the following components:   Fecal Occult Bld POSITIVE (*)    All other components within normal limits  TROPONIN I (HIGH SENSITIVITY) - Abnormal; Notable for the following components:   Troponin I (High Sensitivity) 151 (*)    All other components within normal limits  TROPONIN I (HIGH SENSITIVITY) - Abnormal; Notable for the following components:   Troponin I (High Sensitivity) 157 (*)    All other components within normal limits  RESPIRATORY PANEL BY RT PCR (FLU A&B, COVID)  MAGNESIUM  PROTIME-INR  CBG MONITORING, ED  TYPE AND SCREEN    EKG EKG Interpretation  Date/Time:    01/07/20 04:56:56 Ventricular Rate:   76 PR Interval:   * QRS Duration:  92 QT Interval:   454 QTC Calculation:  511 R Axis:    33 Text Interpretation:  Atrial fibrillation with controlled ventricular rate.  Borderline T wave abnormalities in the inferior leads.  QT interval is prolonged.  No STEMI.   Radiology DG Chest 2 View  Result Date: 01/07/2020 CLINICAL DATA:  Chest pain EXAM: CHEST - 2 VIEW COMPARISON:  12/20/2018 FINDINGS: Streaky density at the left lung base, unchanged and attributed to scarring. There is no edema, consolidation, effusion, or pneumothorax. Normal heart size and mediastinal contours when allowing for rotation. Coronary atherosclerosis. Advanced lower thoracic and lumbar spine degeneration IMPRESSION: Scarring at the left lung base. No acute finding when compared to prior. Electronically Signed   By: Monte Fantasia M.D.   On: 01/07/2020  05:52    Procedures .Critical Care Performed by: Joanne Gavel, PA-C Authorized by: Joline Maxcy  A, PA-C   Critical care provider statement:    Critical care time (minutes):  50   Critical care time was exclusive of:  Separately billable procedures and treating other patients and teaching time   Critical care was necessary to treat or prevent imminent or life-threatening deterioration of the following conditions:  Cardiac failure (GI bleed)   Critical care was time spent personally by me on the following activities:  Ordering and performing treatments and interventions, ordering and review of laboratory studies, ordering and review of radiographic studies, pulse oximetry, re-evaluation of patient's condition, review of old charts, obtaining history from patient or surrogate, examination of patient, evaluation of patient's response to treatment and development of treatment plan with patient or surrogate   I assumed direction of critical care for this patient from another provider in my specialty: no     (including critical care time)  Medications Ordered in ED Medications  pantoprazole (PROTONIX) 80 mg in sodium chloride 0.9 % 100 mL IVPB (has no administration in time range)  pantoprazole (PROTONIX) 80 mg in sodium chloride 0.9 % 100 mL (0.8 mg/mL) infusion (has no administration in time range)  HYDROmorphone (DILAUDID) injection 0.5 mg (0.5 mg Intravenous Given 01/07/20 0719)    ED Course  I have reviewed the triage vital signs and the nursing notes.  Pertinent labs & imaging results that were available during my care of the patient were reviewed by me and considered in my medical decision making (see chart for details).    MDM Rules/Calculators/A&P                          86 year old male with a history of CAD (PCI-RAD 9485), diastolic heart failure, severe orthostatic hypotension, frequent episodes of acute on chronic renal insufficiency (baseline CKD 3) chronic atrial  fibrillation not on anticoagulation, and right BKA, and chronic respiratory failure on 2 L home O2 presenting with chest pain.  Family also presents with concern that patient has had dark, black stools over the last few days.   This patient presents to the ED for concern of ACS this involves an extensive number of treatment options, and is a complaint that carries with it a high risk of complications and morbidity.  The differential diagnosis includes STEMI, NSTEMI, angina, unstable angina, GERD, peptic ulcer disease, PE, pericarditis, myocarditis, tension pneumothorax, gastritis, pancreatitis, or cholecystitis.    Lab Tests:    I Ordered, reviewed, and interpreted labs, which included  CBC, metabolic panel, troponin, COVID-19, and BNP that showed an elevated creatinine of 2.03, BUN 49, hemoglobin of 8.6, troponin 150.  Elevated creatinine suggest AKI.  Elevated BUN could be secondary to upper GI bleed or due to worsening kidney function.  Repeat troponin is pending, but can suggest NSTEMI.  PT/INR and type and screen are pending.   Medicines ordered:    I ordered dilaudid for pain control and Protonix for GI bleed.   Imaging Studies ordered:    I ordered imaging studies which included  chest x-ray  I independently visualized and interpreted imaging which was unremarkable   Additional history obtained:    Previous records obtained and reviewed  Collaborating information provided by the patient's wife who is at bedside   Reevaluation:   After the interventions stated above, I reevaluated the patient and found improving chest pain.   Consults:  Hospitalist service. Dr. Lorin Mercy has accepted the patient for admission.  After the interventions stated above, I reevaluated the  patient and found improving chest pain.   The patient was seen and independently evaluated by Dr. Christy Gentles, attending physician. With elevated troponin, there is concern patient could be having an NSTEMI.   However, there is no previous high-sensitivity troponin available in the patient's chart.  This could also be chronically elevated from the patient's history of atrial fibrillation as patient is not anticoagulated.  Chest pain could be secondary to a PE since patient is also not anticoagulated, but he has no hypoxia at this time, but could be included on the differential.  Given elevated troponin, considered initiating the patient on heparin.  However, patient appears of had about a 2-1/2 to 3 g drop in hemoglobin over the last 2 months.  With patient's wife endorsing dark stools, Hemoccult was performed and was positive.  Will avoid heparin at this time given that patient may also have a GI bleed.  His repeat troponin is pending.  Patient does also have a new AKI.  Patient is critically ill with multiple acute medical problems that will require further work-up and evaluation with admission.  After discussing the patient with Dr. Christy Gentles, will defer cardiology consult at this time and plan to admit to medicine for GI bleed.  Patient's vitals are stable at this time.  With hemoglobin of 8.6, no indication for immediate blood products to be administered at this time.  Dr. Lorin Mercy with the hospitalist who is accepted the patient for admission.  The patient appears reasonably stabilized for admission considering the current resources, flow, and capabilities available in the ED at this time, and I doubt any other [facility] requiring further screening and/or treatment in the ED prior to admission.  Final Clinical Impression(s) / ED Diagnoses Final diagnoses:  Atypical chest pain  Elevated troponin  Gastrointestinal hemorrhage, unspecified gastrointestinal hemorrhage type  Prolonged QT interval    Rx / DC Orders ED Discharge Orders    None       Markel, [place], PA-C 01/07/20 0759    Ripley Fraise, MD 01/07/20 2313

## 2020-01-07 NOTE — Progress Notes (Signed)
Pt arrived from unit from ED due to chest pain GI bleed .Will continue to monitor. Pt. Oriented to unit. Old R BKA  Phoebe Sharps, RN

## 2020-01-07 NOTE — H&P (View-Only) (Signed)
Consultation  Referring Provider:  [facility]/ [facility] Primary Care Physician:  [name], MD Primary Gastroenterologist:   Dr Earlean Shawl ?  Reason for Consultation: GI bleeding  HPI: [name] is a 86 y.o. male, who presented to the emergency room in the early hours this morning with complaints of chest pain which had been prolonged at home.  The chest pain did not respond to analgesic or 2 nitroglycerin. He had apparently had a similar episode about 24 hours prior.  His wife noted that he had been passing dark stools over the past few days. He does take baby aspirin once daily, no blood thinners. Patient has history of coronary artery disease status post remote PCI, congestive heart failure, severe orthostatic hypotension, chronic kidney disease,, chronic respiratory failure requiring home O2, chronic atrial fibrillation-again not anticoagulated, and is status post right BKA.  Labs in the ER show hemoglobin 8.6/hematocrit 29.1, stool documented Hemoccult positive. INR 1.2 Troponin I 51 and 157 elevated BNP 519 Reviewing prior labs hemoglobin was 11 hematocrit 33 in August 2021 and in April 2021 hemoglobin 10.9 hematocrit of 35.  EKG by report no ST changes  No endoscopic evaluation noted in epic or Care Everywhere. Patient and wife both believe patient had at least 1 colonoscopy with Dr. Earlean Shawl  in the past, the last one would have been at least 10 years ago, no prior EGD to their knowledge. No prior history of GI bleeding. Patient's wife relates that he has had some problems with constipation especially since he had AKA amputation about a year and a half ago.  She has kept him on MiraLAX daily and usually has a bowel movement every 1 to 2 days.  She thinks that perhaps she has noticed that his stools have been a bit darker gradually over the past few weeks.  He has not had any increased frequency in bowel movements, no diarrhea, no hematochezia. Patient denies any abdominal pain, no  recent nausea or vomiting, no dysphagia or odynophagia and no chronic problems with heartburn or indigestion.  He received Dilaudid in the emergency room and says the head ever since he had the pain medicine he feels just fine.  He denies any shortness of breath.   Past Medical History:  Diagnosis Date  . Abnormality of gait 09/22/2015  . Arthritis   . Atrial fibrillation ([place])   . CAD (coronary artery disease)    Stent to RCA, Penta stent, 99% reduced to 0% 2002.  . Cancer ([place])    skin CA removed from back  . Chronic insomnia 03/30/2015  . Complication of anesthesia    trouble waking up  . GERD (gastroesophageal reflux disease)   . Hypercholesteremia   . Hypertension   . Hypothyroidism   . Memory difficulty 09/22/2015  . Osteoarthritis   . Pneumonia   . Seizures ([place])   . Sepsis ([place]) 05/2017  . Transient alteration of awareness 03/30/2015  . Vertigo    hx of    Past Surgical History:  Procedure Laterality Date  . AMPUTATION Right 03/28/2018   Procedure: AMPUTATION BELOW KNEE;  Surgeon: Newt Minion, MD;  Location: [facility];  Service: Orthopedics;  Laterality: Right;  . APPLICATION OF WOUND VAC Right 01/23/2019   Procedure: Application Of  Prevena Wound Vac;  Surgeon: Newt Minion, MD;  Location: [facility];  Service: Orthopedics;  Laterality: Right;  . BACK SURGERY    . EYE SURGERY     Bilateral Cataract surgery   . HERNIA  REPAIR    . I & D EXTREMITY Right 05/10/2015   Procedure: IRRIGATION AND DEBRIDEMENT EXTREMITY;  Surgeon: Leanora Cover, MD;  Location: [facility];  Service: Orthopedics;  Laterality: Right;  . KNEE ARTHROPLASTY     right knee X 2; left knee once  . LAMINECTOMY     X 6  . LEG AMPUTATION BELOW KNEE Right 03/28/2018  . POSTERIOR CERVICAL FUSION/FORAMINOTOMY  01/28/2012   Procedure: POSTERIOR CERVICAL FUSION/FORAMINOTOMY LEVEL 3;  Surgeon: [name], MD;  Location: [facility];  Service: Neurosurgery;  Laterality: Left;  Posterior  Cervical Five-Thoracic One Fusion, Arthrodesis with LEFT Cervical Seven-thoracic One Laminectomy, Foraminotomy and Resection of Synovial Cyst  . POSTERIOR CERVICAL FUSION/FORAMINOTOMY N/A 01/29/2013   Procedure: POSTERIOR CERVICAL FUSION/FORAMINOTOMY LEVEL 1 and C2-5 Posteriolateral Arthrodesis;  Surgeon: [name], MD;  Location: [facility];  Service: Neurosurgery;  Laterality: N/A;  C2-C3 Laminectomy C2-C3 posterior cervical arthrodesis  . STUMP REVISION Right 01/23/2019   Procedure: REVISION RIGHT BELOW KNEE AMPUTATION;  Surgeon: Newt Minion, MD;  Location: [facility];  Service: Orthopedics;  Laterality: Right;  . TONSILLECTOMY      Prior to Admission medications   Medication Sig Start Date End Date Taking? Authorizing Provider  alendronate (FOSAMAX) 70 MG tablet Take 70 mg by mouth every Wednesday. Remain upright for 30-60 minutes. 01/06/12  Yes [provider]  aspirin EC 81 MG tablet Take 81 mg by mouth daily.   Yes [provider]  Calcium Carb-Cholecalciferol (CALCIUM 600+D) 600-800 MG-UNIT TABS Take 1 tablet by mouth daily.   Yes [provider]  diltiazem (CARDIZEM CD) 240 MG 24 hr capsule TAKE 1 CAPSULE BY MOUTH EVERY MORNING. HOLD IF SYSTOLIC BLOOD PRESSURE IS LESS THAN 100 Patient taking differently: Take 240 mg by mouth daily.  12/10/19  Yes Croitoru, Mihai, MD  divalproex (DEPAKOTE ER) 250 MG 24 hr tablet Take 250 mg by mouth in the morning and at bedtime.    Yes [provider]  doxazosin (CARDURA) 4 MG tablet TAKE 1 TABLET(4 MG) BY MOUTH AT BEDTIME Patient taking differently: Take 4 mg by mouth at bedtime.  12/17/19  Yes Croitoru, Mihai, MD  furosemide (LASIX) 20 MG tablet Take 1 tablet (20 mg total) by mouth daily. 07/20/19 01/06/29 Yes Croitoru, Mihai, MD  HYDROcodone-acetaminophen (NORCO) 10-325 MG tablet Take 1 tablet by mouth every 4 (four) hours as needed for pain. 06/23/19  Yes [provider]  levothyroxine (SYNTHROID,  LEVOTHROID) 150 MCG tablet Take 150 mcg by mouth daily before breakfast. 12/27/11  Yes [provider]  meloxicam (MOBIC) 15 MG tablet Take 15 mg by mouth daily. 10/15/19  Yes [provider]  metoprolol tartrate (LOPRESSOR) 25 MG tablet TAKE 1 TABLET BY MOUTH TWICE DAILY WITH A MEAL OR IMMEDIATELY AFTER Patient taking differently: Take 25 mg by mouth 2 (two) times daily.  08/06/19  Yes Croitoru, Mihai, MD  Multiple Vitamins-Minerals (CERTAVITE/ANTIOXIDANTS) TABS Take 1 tablet by mouth every evening.    Yes [provider]  nitrofurantoin (MACRODANTIN) 100 MG capsule Take 100 mg by mouth daily. 09/30/19  Yes [provider]  nitroGLYCERIN (NITROSTAT) 0.4 MG SL tablet Place 1 tablet (0.4 mg total) under the tongue every 5 (five) minutes as needed for chest pain (call 911 afer 3 doses and if chest pain persists). 06/19/18  Yes Croitoru, Mihai, MD  polyethylene glycol (MIRALAX / GLYCOLAX) 17 g packet Take 17 g by mouth at bedtime.   Yes [provider]  potassium chloride SA (KLOR-CON) 20 MEQ tablet Take 0.5 tablets (10 mEq total) by mouth daily. 10/22/19  Yes Croitoru, Mihai, MD  pravastatin (PRAVACHOL) 40 MG tablet Take 40 mg by mouth every evening.  10/29/14  Yes [provider]  predniSONE (DELTASONE) 1 MG tablet Take 9 mg by mouth daily. 06/17/19  Yes [provider]    Current Facility-Administered Medications  Medication Dose Route Frequency Provider Last Rate Last Admin  . acetaminophen (TYLENOL) tablet 650 mg  650 mg Oral Q6H PRN Karmen Bongo, MD       Or  . acetaminophen (TYLENOL) suppository 650 mg  650 mg Rectal Q6H PRN Karmen Bongo, MD      . diltiazem (CARDIZEM CD) 24 hr capsule 240 mg  240 mg Oral Daily Karmen Bongo, MD      . divalproex (DEPAKOTE ER) 24 hr tablet 250 mg  250 mg Oral BID Karmen Bongo, MD      . doxazosin (CARDURA) tablet 4 mg  4 mg Oral QHS Karmen Bongo, MD      . hydrALAZINE (APRESOLINE) injection 5  mg  5 mg Intravenous Q4H PRN Karmen Bongo, MD      . HYDROcodone-acetaminophen [facility]) 10-325 MG per tablet 1 tablet  1 tablet Oral Q4H PRN Karmen Bongo, MD      . lactated ringers infusion   Intravenous Continuous Karmen Bongo, MD      . Derrill Memo ON 01/08/2020] levothyroxine (SYNTHROID) tablet 150 mcg  150 mcg Oral QAC breakfast Karmen Bongo, MD      . metoprolol tartrate (LOPRESSOR) tablet 25 mg  25 mg Oral BID Karmen Bongo, MD      . morphine 2 MG/ML injection 2 mg  2 mg Intravenous Q2H PRN Karmen Bongo, MD      . ondansetron [facility]) tablet 4 mg  4 mg Oral Q6H PRN Karmen Bongo, MD       Or  . ondansetron [facility]) injection 4 mg  4 mg Intravenous Q6H PRN Karmen Bongo, MD      . pantoprazole (PROTONIX) 80 mg in sodium chloride 0.9 % 100 mL (0.8 mg/mL) infusion  8 mg/hr Intravenous Continuous Karmen Bongo, MD 10 mL/hr at 01/07/20 0922 8 mg/hr at 01/07/20 0922  . pravastatin (PRAVACHOL) tablet 40 mg  40 mg Oral QPM Karmen Bongo, MD      . predniSONE (DELTASONE) tablet 9 mg  9 mg Oral Daily Karmen Bongo, MD      . sodium chloride flush (NS) 0.9 % injection 3 mL  3 mL Intravenous Q12H Karmen Bongo, MD       Current Outpatient Medications  Medication Sig Dispense Refill  . alendronate (FOSAMAX) 70 MG tablet Take 70 mg by mouth every Wednesday. Remain upright for 30-60 minutes.    Marland Kitchen aspirin EC 81 MG tablet Take 81 mg by mouth daily.    . Calcium Carb-Cholecalciferol (CALCIUM 600+D) 600-800 MG-UNIT TABS Take 1 tablet by mouth daily.    Marland Kitchen diltiazem (CARDIZEM CD) 240 MG 24 hr capsule TAKE 1 CAPSULE BY MOUTH EVERY MORNING. HOLD IF SYSTOLIC BLOOD PRESSURE IS LESS THAN 100 (Patient taking differently: Take 240 mg by mouth daily. ) 90 capsule 3  . divalproex (DEPAKOTE ER) 250 MG 24 hr tablet Take 250 mg by mouth in the morning and at bedtime.     Marland Kitchen doxazosin (CARDURA) 4 MG tablet TAKE 1 TABLET(4 MG) BY MOUTH AT BEDTIME (Patient taking differently: Take 4 mg by mouth at  bedtime. ) 90  tablet 3  . furosemide (LASIX) 20 MG tablet Take 1 tablet (20 mg total) by mouth daily. 90 tablet 3  . HYDROcodone-acetaminophen (NORCO) 10-325 MG tablet Take 1 tablet by mouth every 4 (four) hours as needed for pain.    Marland Kitchen levothyroxine (SYNTHROID, LEVOTHROID) 150 MCG tablet Take 150 mcg by mouth daily before breakfast.    . meloxicam (MOBIC) 15 MG tablet Take 15 mg by mouth daily.    . metoprolol tartrate (LOPRESSOR) 25 MG tablet TAKE 1 TABLET BY MOUTH TWICE DAILY WITH A MEAL OR IMMEDIATELY AFTER (Patient taking differently: Take 25 mg by mouth 2 (two) times daily. ) 180 tablet 2  . Multiple Vitamins-Minerals (CERTAVITE/ANTIOXIDANTS) TABS Take 1 tablet by mouth every evening.     . nitrofurantoin (MACRODANTIN) 100 MG capsule Take 100 mg by mouth daily.    . nitroGLYCERIN (NITROSTAT) 0.4 MG SL tablet Place 1 tablet (0.4 mg total) under the tongue every 5 (five) minutes as needed for chest pain (call 911 afer 3 doses and if chest pain persists). 25 tablet 11  . polyethylene glycol (MIRALAX / GLYCOLAX) 17 g packet Take 17 g by mouth at bedtime.    . potassium chloride SA (KLOR-CON) 20 MEQ tablet Take 0.5 tablets (10 mEq total) by mouth daily. 45 tablet 3  . pravastatin (PRAVACHOL) 40 MG tablet Take 40 mg by mouth every evening.   2  . predniSONE (DELTASONE) 1 MG tablet Take 9 mg by mouth daily.      Allergies as of 01/07/2020 - Review Complete 01/07/2020  Allergen Reaction Noted  . Eliquis [apixaban] Other (See Comments) 10/22/2017  . Demerol [meperidine] Nausea And Vomiting 01/18/2012  . Keppra [levetiracetam] Other (See Comments) 09/22/2015  . Vimpat [lacosamide]  01/21/2019    Family History  Problem Relation Age of Onset  . Hypertension Mother   . Cancer Mother   . Kidney failure Father   . Heart disease Father     Social History   Socioeconomic History  . Marital status: Married    Spouse name: Carless Slatten  . Number of children: 3  . Years of education: 33  .  Highest education level: Professional school degree (e.g., MD, DDS, DVM, JD)  Occupational History  . Occupation: Retired Software engineer  Tobacco Use  . Smoking status: Former Smoker    Packs/day: 1.00    Years: 5.00    Pack years: 5.00  . Smokeless tobacco: Never Used  Vaping Use  . Vaping Use: Never used  Substance and Sexual Activity  . Alcohol use: Not Currently    Comment: rare  . Drug use: No  . Sexual activity: Not Currently  Other Topics Concern  . Not on file  Social History Narrative   Lives at home w/ his wife [name]   Patient drinks 4-5 cups of coffee daily.   Patient is right handed.    Social Determinants of Health   Financial Resource Strain: Low Risk   . Difficulty of Paying Living Expenses: Not hard at all  Food Insecurity: No Food Insecurity  . Worried About Charity fundraiser in the Last Year: Never true  . Ran Out of Food in the Last Year: Never true  Transportation Needs: No Transportation Needs  . Lack of Transportation (Medical): No  . Lack of Transportation (Non-Medical): No  Physical Activity: Inactive  . Days of Exercise per Week: 0 days  . Minutes of Exercise per Session: 0 min  Stress: No Stress Concern Present  .  Feeling of Stress : Only a little  Social Connections: Moderately Integrated  . Frequency of Communication with Friends and Family: Twice a week  . Frequency of Social Gatherings with Friends and Family: More than three times a week  . Attends Religious Services: 1 to 4 times per year  . Active Member of Clubs or Organizations: No  . Attends Archivist Meetings: Never  . Marital Status: Married  Human resources officer Violence: Not At Risk  . Fear of Current or Ex-Partner: No  . Emotionally Abused: No  . Physically Abused: No  . Sexually Abused: No    Review of Systems: Pertinent positive and negative review of systems were noted in the above HPI section.  All other review of systems was otherwise negative. Physical  Exam: Vital signs in last 24 hours: Temp:  [97.7 F (36.5 C)-98.3 F (36.8 C)] 98.3 F (36.8 C) (10/28 0915) Pulse Rate:  [49-95] 82 (10/28 1030) Resp:  [10-24] 13 (10/28 1030) BP: (132-172)/(67-100) 147/85 (10/28 1030) SpO2:  [93 %-100 %] 100 % (10/28 1030)   General:   Alert,  Well-developed, well-nourished, elderly white male pleasant and cooperative in NAD.  Wife at bedside Head:  Normocephalic and atraumatic. Eyes:  Sclera clear, no icterus.   Conjunctiva pale Ears:  Normal auditory acuity. Nose:  No deformity, discharge,  or lesions. Mouth:  No deformity or lesions.   Neck:  Supple; no masses or thyromegaly. Lungs:  Clear throughout to auscultation.   No wheezes, crackles, or rhonchi. Heart:  irRegular rate and rhythm; no murmurs, clicks, rubs,  or gallops. Abdomen:  Soft,nontender, BS active,nonpalp mass or hsm.   Rectal: Not done, stool documented heme positive this morning Msk:  Symmetrical without gross deformities. . Pulses:  Normal pulses noted. Extremities: Status post left AKA Neurologic:  Alert and  oriented x4;  grossly normal neurologically. Skin:  Intact without significant lesions or rashes.. Psych:  Alert and cooperative. Normal mood and affect.  Intake/Output from previous day: No intake/output data recorded. Intake/Output this shift: Total I/O In: 100 [IV Piggyback:100] Out: -   Lab Results: Recent Labs    01/07/20 0507  WBC 10.7*  HGB 8.6*  HCT 29.1*  PLT 127*   BMET Recent Labs    01/07/20 0507  NA 141  K 4.4  CL 106  CO2 23  GLUCOSE 92  BUN 49*  CREATININE 2.03*  CALCIUM 9.0   LFT Recent Labs    01/07/20 0507  PROT 5.2*  ALBUMIN 3.2*  AST 19  ALT 11  ALKPHOS 52  BILITOT 0.9   PT/INR Recent Labs    01/07/20 0828  LABPROT 14.3  INR 1.2        IMPRESSION:   #47 86 year old white male with history of coronary artery disease status post remote PCI, history of congestive heart failure and chronic atrial fibrillation  on baby aspirin only who presented to the emergency room early this morning after a prolonged episode of chest pain lasting a couple of hours and not responsive to analgesic and 2 nitroglycerin.  No symptoms currently post Dilaudid. Patient has elevated BNP and elevated troponin x2  Suspect demand ischemia versus non-STE MI  #2 anemia, macrocytic with 2.5 g drop in hemoglobin over the past 2 months, heme positive stool and dark stools noted at home over the past couple of weeks. Suspect patient has had a slow GI bleed probably upper though cannot rule out lower.  BUN of 49 supports upper source. Rule out  esophagitis, peptic ulcer disease, gastropathy, AVMs, neoplasm  #3 chronic respiratory failure-on chronic O2 at home 4.  Chronic atrial fibrillation 5.  Chronic kidney disease 6.  History of severe orthostatic hypotension 7.  Status post left BKA  Plan; IV PPI infusion has been started, continue Start full liquid diet, n.p.o. after midnight Serial hemoglobins and would have low threshold for transfusing for hemoglobin 8 or less given advanced age cardiac disease and symptomatic  Cardiology consultation-discussed with Dr. Lorin Mercy Will need EGD, possibly tomorrow.  Await cardiology recommendations. Thank you we will follow with you    Amy Esterwood PA-C 01/07/2020, 11:29 AM  I have also personally evaluated the patient taking history and examined the patient spoke to his wife.  Also spoke to Dr. Debara Pickett who does not think any additional cardiac work-up is necessary in this setting regarding need for sedation and upper GI endoscopy.  Risk factors for bleeding are aspirin, meloxicam use, and Fosamax use.  Plan for EGD with propofol tomorrow afternoon.  The risks and benefits as well as alternatives of endoscopic procedure(s) have been discussed and reviewed. All questions answered. The patient agrees to proceed.   Gatha Mayer, MD, [facility] 01/07/2020 5:58  PM

## 2020-01-07 NOTE — ED Notes (Signed)
Attempted to call nursing report to [id number]

## 2020-01-07 NOTE — ED Notes (Signed)
Lunch tray ordered 

## 2020-01-07 NOTE — Consult Note (Signed)
Cardiology Consultation:   Patient ID: CLIFORD [name] MRN: [id number]; DOB: Apr 05, 1933  Admit date: 01/07/2020 Date of Consult: 01/07/2020  Primary Care Provider: [name], MD [facility] Cardiologist: Sanda Klein, MD  [facility] Electrophysiologist:  None    Patient Profile:   [name] is a 86 y.o. male with a hx of this post PCI to RCA '02, diastolic heart failure severe orthostatic hypotension, CKD stage III, chronic atrial fibrillation, chronic slow healing rate BKA wound with history of severe spontaneous hemorrhage (has been off anticoagulation), and chronic corticosteroid therapy who is being seen today for the evaluation of preop evaluation at the request of Dr. Lorin Mercy.  History of Present Illness:   [name] is an 86 year old male with past medical history noted above.  He is followed as an outpatient by Dr. Sallyanne Kuster.  Last seen in the office on 10/2019 and noted to have done remarkably well over the last year from a heart failure point of view.  His weight was essentially stable.  He had occasional swelling in his left lower extremity.  Notes indicate his right amputation stump required revision but subsequently healed well.  Unfortunately he developed another skin excoriation from his prosthesis which he can no longer wear.  It was noted to be healing well but unfortunately he had wounds on the toes of his left foot.  In regards to his permanent atrial fibrillation he has not been on anticoagulation secondary to spontaneous bleeding in his right leg.  He was continued on his home medications with plans for follow-up in 1 year.  He presented to the ED on 10/28 with complaints of gradual onset of centralized chest pain which developed the night prior shortly after eating dinner while trying to get ready for bed.  He was unable to fall asleep.  Attempted to treat symptoms with home Norco as well as sublingual nitroglycerin x2 with no improvement.  Described as a  burning sensation. Patient's wife also reported noting he had very dark stools over the past 2 days.  He has been working with physical therapy intermittently, and denies any anginal symptoms with doing this activity.  In the ED his labs showed stable electrolytes, creatinine 2, BNP 519, high-sensitivity troponin 151>>157, WBC 10.7, hemoglobin 8.6. He was occult stool positive.  Covid negative.  EKG showed rate controlled atrial fibrillation.  Chest x-ray with scarring to the left lung base, but no acute findings.  GI was consulted and patient was started on IV PPI with plans for EGD tomorrow pending cardiac preoperative evaluation.  Past Medical History:  Diagnosis Date  . Abnormality of gait 09/22/2015  . Arthritis   . Atrial fibrillation ([place])   . CAD (coronary artery disease)    Stent to RCA, Penta stent, 99% reduced to 0% 2002.  . Cancer ([place])    skin CA removed from back  . Chronic insomnia 03/30/2015  . Complication of anesthesia    trouble waking up  . GERD (gastroesophageal reflux disease)   . Hypercholesteremia   . Hypertension   . Hypothyroidism   . Memory difficulty 09/22/2015  . Osteoarthritis   . Pneumonia   . Seizures ([place])   . Sepsis ([place]) 05/2017  . Transient alteration of awareness 03/30/2015  . Vertigo    hx of    Past Surgical History:  Procedure Laterality Date  . AMPUTATION Right 03/28/2018   Procedure: AMPUTATION BELOW KNEE;  Surgeon: Newt Minion, MD;  Location: [place];  Service: Orthopedics;  Laterality:  Right;  Marland Kitchen APPLICATION OF WOUND VAC Right 01/23/2019   Procedure: Application Of  Prevena Wound Vac;  Surgeon: Newt Minion, MD;  Location: [place];  Service: Orthopedics;  Laterality: Right;  . BACK SURGERY    . EYE SURGERY     Bilateral Cataract surgery   . HERNIA REPAIR    . I & D EXTREMITY Right 05/10/2015   Procedure: IRRIGATION AND DEBRIDEMENT EXTREMITY;  Surgeon: Leanora Cover, MD;  Location: [place];  Service: Orthopedics;   Laterality: Right;  . KNEE ARTHROPLASTY     right knee X 2; left knee once  . LAMINECTOMY     X 6  . LEG AMPUTATION BELOW KNEE Right 03/28/2018  . POSTERIOR CERVICAL FUSION/FORAMINOTOMY  01/28/2012   Procedure: POSTERIOR CERVICAL FUSION/FORAMINOTOMY LEVEL 3;  Surgeon: [name], MD;  Location: [place] [facility];  Service: Neurosurgery;  Laterality: Left;  Posterior Cervical Five-Thoracic One Fusion, Arthrodesis with LEFT Cervical Seven-thoracic One Laminectomy, Foraminotomy and Resection of Synovial Cyst  . POSTERIOR CERVICAL FUSION/FORAMINOTOMY N/A 01/29/2013   Procedure: POSTERIOR CERVICAL FUSION/FORAMINOTOMY LEVEL 1 and C2-5 Posteriolateral Arthrodesis;  Surgeon: [name], MD;  Location: [place] [facility];  Service: Neurosurgery;  Laterality: N/A;  C2-C3 Laminectomy C2-C3 posterior cervical arthrodesis  . STUMP REVISION Right 01/23/2019   Procedure: REVISION RIGHT BELOW KNEE AMPUTATION;  Surgeon: Newt Minion, MD;  Location: [place];  Service: Orthopedics;  Laterality: Right;  . TONSILLECTOMY       Home Medications:  Prior to Admission medications   Medication Sig Start Date End Date Taking? Authorizing Provider  alendronate (FOSAMAX) 70 MG tablet Take 70 mg by mouth every Wednesday. Remain upright for 30-60 minutes. 01/06/12  Yes [provider]  aspirin EC 81 MG tablet Take 81 mg by mouth daily.   Yes [provider]  Calcium Carb-Cholecalciferol (CALCIUM 600+D) 600-800 MG-UNIT TABS Take 1 tablet by mouth daily.   Yes [provider]  diltiazem (CARDIZEM CD) 240 MG 24 hr capsule TAKE 1 CAPSULE BY MOUTH EVERY MORNING. HOLD IF SYSTOLIC BLOOD PRESSURE IS LESS THAN 100 Patient taking differently: Take 240 mg by mouth daily.  12/10/19  Yes Croitoru, Mihai, MD  divalproex (DEPAKOTE ER) 250 MG 24 hr tablet Take 250 mg by mouth in the morning and at bedtime.    Yes [provider]  doxazosin (CARDURA) 4 MG tablet TAKE 1 TABLET(4 MG) BY MOUTH AT  BEDTIME Patient taking differently: Take 4 mg by mouth at bedtime.  12/17/19  Yes Croitoru, Mihai, MD  furosemide (LASIX) 20 MG tablet Take 1 tablet (20 mg total) by mouth daily. 07/20/19 01/06/29 Yes Croitoru, Mihai, MD  HYDROcodone-acetaminophen (NORCO) 10-325 MG tablet Take 1 tablet by mouth every 4 (four) hours as needed for pain. 06/23/19  Yes [provider]  levothyroxine (SYNTHROID, LEVOTHROID) 150 MCG tablet Take 150 mcg by mouth daily before breakfast. 12/27/11  Yes [provider]  meloxicam (MOBIC) 15 MG tablet Take 15 mg by mouth daily. 10/15/19  Yes [provider]  metoprolol tartrate (LOPRESSOR) 25 MG tablet TAKE 1 TABLET BY MOUTH TWICE DAILY WITH A MEAL OR IMMEDIATELY AFTER Patient taking differently: Take 25 mg by mouth 2 (two) times daily.  08/06/19  Yes Croitoru, Mihai, MD  Multiple Vitamins-Minerals (CERTAVITE/ANTIOXIDANTS) TABS Take 1 tablet by mouth every evening.    Yes [provider]  nitrofurantoin (MACRODANTIN) 100 MG capsule Take 100 mg by mouth daily. 09/30/19  Yes [provider]  nitroGLYCERIN (NITROSTAT)  0.4 MG SL tablet Place 1 tablet (0.4 mg total) under the tongue every 5 (five) minutes as needed for chest pain (call 911 afer 3 doses and if chest pain persists). 06/19/18  Yes Croitoru, Mihai, MD  polyethylene glycol (MIRALAX / GLYCOLAX) 17 g packet Take 17 g by mouth at bedtime.   Yes [provider]  potassium chloride SA (KLOR-CON) 20 MEQ tablet Take 0.5 tablets (10 mEq total) by mouth daily. 10/22/19  Yes Croitoru, Mihai, MD  pravastatin (PRAVACHOL) 40 MG tablet Take 40 mg by mouth every evening.  10/29/14  Yes [provider]  predniSONE (DELTASONE) 1 MG tablet Take 9 mg by mouth daily. 06/17/19  Yes [provider]    Inpatient Medications: Scheduled Meds: . diltiazem  240 mg Oral Daily  . divalproex  250 mg Oral BID  . doxazosin  4 mg Oral QHS  . [START ON 01/08/2020] levothyroxine  150 mcg Oral  QAC breakfast  . metoprolol tartrate  25 mg Oral BID  . pravastatin  40 mg Oral QPM  . predniSONE  9 mg Oral Daily  . sodium chloride flush  3 mL Intravenous Q12H   Continuous Infusions: . lactated ringers 100 mL/hr at 01/07/20 1142  . pantoprozole (PROTONIX) infusion 8 mg/hr (01/07/20 0922)   PRN Meds: acetaminophen **OR** acetaminophen, hydrALAZINE, HYDROcodone-acetaminophen, morphine injection, ondansetron **OR** ondansetron (ZOFRAN) IV  Allergies:    Allergies  Allergen Reactions  . Eliquis [Apixaban] Other (See Comments)    bleeding  . Demerol [Meperidine] Nausea And Vomiting  . Keppra [Levetiracetam] Other (See Comments)    Causes sleepiness, mental status changes  . Vimpat [Lacosamide]     Over sedated     Social History:   Social History   Socioeconomic History  . Marital status: Married    Spouse name: Vahe Pienta  . Number of children: 3  . Years of education: 60  . Highest education level: Professional school degree (e.g., MD, DDS, DVM, JD)  Occupational History  . Occupation: Retired Software engineer  Tobacco Use  . Smoking status: Former Smoker    Packs/day: 1.00    Years: 5.00    Pack years: 5.00  . Smokeless tobacco: Never Used  Vaping Use  . Vaping Use: Never used  Substance and Sexual Activity  . Alcohol use: Not Currently    Comment: rare  . Drug use: No  . Sexual activity: Not Currently  Other Topics Concern  . Not on file  Social History Narrative   Lives at home w/ his wife [name]   Patient drinks 4-5 cups of coffee daily.   Patient is right handed.    Social Determinants of Health   Financial Resource Strain: Low Risk   . Difficulty of Paying Living Expenses: Not hard at all  Food Insecurity: No Food Insecurity  . Worried About Charity fundraiser in the Last Year: Never true  . Ran Out of Food in the Last Year: Never true  Transportation Needs: No Transportation Needs  . Lack of Transportation (Medical): No  . Lack of  Transportation (Non-Medical): No  Physical Activity: Inactive  . Days of Exercise per Week: 0 days  . Minutes of Exercise per Session: 0 min  Stress: No Stress Concern Present  . Feeling of Stress : Only a little  Social Connections: Moderately Integrated  . Frequency of Communication with Friends and Family: Twice a week  . Frequency of Social Gatherings with Friends and Family: More than three times a week  .  Attends Religious Services: 1 to 4 times per year  . Active Member of Clubs or Organizations: No  . Attends Archivist Meetings: Never  . Marital Status: Married  Human resources officer Violence: Not At Risk  . Fear of Current or Ex-Partner: No  . Emotionally Abused: No  . Physically Abused: No  . Sexually Abused: No    Family History:    Family History  Problem Relation Age of Onset  . Hypertension Mother   . Cancer Mother   . Kidney failure Father   . Heart disease Father      ROS:  Please see the history of present illness.   All other ROS reviewed and negative.     Physical Exam/Data:   Vitals:   01/07/20 1230 01/07/20 1345 01/07/20 1430 01/07/20 1500  BP: 125/68 134/75 116/70 114/65  Pulse: 70 69 81 96  Resp: 10 14 12  (!) 25  Temp:      TempSrc:      SpO2: 96% 96% 100% 97%    Intake/Output Summary (Last 24 hours) at 01/07/2020 1532 Last data filed at 01/07/2020 0921 Gross per 24 hour  Intake 100 ml  Output --  Net 100 ml   Last 3 Weights 12/29/2019 12/01/2019 10/27/2019  Weight (lbs) 155 lb 155 lb 155 lb  Weight (kg) 70.308 kg 70.308 kg 70.308 kg     There is no height or weight on file to calculate BMI.  General:  Well nourished, well developed, in no acute distress HEENT: normal Lymph: no adenopathy Neck: no JVD Endocrine:  No thryomegaly Vascular: No carotid bruits Cardiac:  normal S1, S2; Irreg Irreg; no murmur  Lungs:  clear to auscultation bilaterally, no wheezing, rhonchi or rales  Abd: soft, nontender, no hepatomegaly  Ext: no  edema Musculoskeletal:  Right BKA Skin: warm and dry  Neuro:  CNs 2-12 intact, no focal abnormalities noted Psych:  Normal affect   EKG:  The EKG was personally reviewed and demonstrates: Rate controlled atrial fibrillation  Relevant CV Studies:  Echo: 03/2018  Study Conclusions   - Left ventricle: The cavity size was normal. Wall thickness was  increased in a pattern of moderate LVH. Systolic function was  normal. The estimated ejection fraction was in the range of 55%  to 60%. Left ventricular diastolic function parameters were  normal.  - Aortic valve: There was mild regurgitation.  - Mitral valve: Calcified annulus. Mildly thickened leaflets .  There was mild regurgitation. Valve area by pressure half-time:  1.04 cm^2.  - Left atrium: The atrium was mildly dilated.  - Atrial septum: No defect or patent foramen ovale was identified.   Laboratory Data:  High Sensitivity Troponin:   Recent Labs  Lab 01/07/20 0507 01/07/20 0641  TROPONINIHS 151* 157*     Chemistry Recent Labs  Lab 01/07/20 0507  NA 141  K 4.4  CL 106  CO2 23  GLUCOSE 92  BUN 49*  CREATININE 2.03*  CALCIUM 9.0  GFRNONAA 32*  ANIONGAP 12    Recent Labs  Lab 01/07/20 0507  PROT 5.2*  ALBUMIN 3.2*  AST 19  ALT 11  ALKPHOS 52  BILITOT 0.9   Hematology Recent Labs  Lab 01/07/20 0507  WBC 10.7*  RBC 2.63*  HGB 8.6*  HCT 29.1*  MCV 110.6*  MCH 32.7  MCHC 29.6*  RDW 14.5  PLT 127*   BNP Recent Labs  Lab 01/07/20 0507  BNP 519.4*    DDimer No results for input(s):  DDIMER in the last 168 hours.   Radiology/Studies:  DG Chest 2 View  Result Date: 01/07/2020 CLINICAL DATA:  Chest pain EXAM: CHEST - 2 VIEW COMPARISON:  12/20/2018 FINDINGS: Streaky density at the left lung base, unchanged and attributed to scarring. There is no edema, consolidation, effusion, or pneumothorax. Normal heart size and mediastinal contours when allowing for rotation. Coronary  atherosclerosis. Advanced lower thoracic and lumbar spine degeneration IMPRESSION: Scarring at the left lung base. No acute finding when compared to prior. Electronically Signed   By: Monte Fantasia M.D.   On: 01/07/2020 05:52    Assessment and Plan:   HENRICK MCGUE is a 86 y.o. male with a hx of this post PCI to RCA '02, diastolic heart failure, severe orthostatic hypotension, CKD stage III, chronic atrial fibrillation, chronic slow healing right BKA wound with history of severe spontaneous hemorrhage (has been off anticoagulation), and chronic corticosteroid therapy who is being seen today for the evaluation of preop evaluation at the request of Dr. Lorin Mercy  1. Preop evaluation in the setting of acute GI bleed/planned EGD: Presents with anemia and positive fecal occult stool.  Wife notes several days of dark stools prior to admission.  He does have a history of chronic atrial fibrillation but has not been on oral anticoagulation secondary to severe bleeding from a right BKA.  Has only been on aspirin 81 mg daily.  Does have history of coronary disease with PCI to the RCA in 2002. Did have some chest pain that was burning in nature prior to admission. hsTn mildly elevated with flat trend not suggestive of ACS.  EKG with rate controlled atrial fibrillation.  It is this time would not suggest further cardiac preoperative evaluation.  He is notably at higher risk just given his coronary disease and chronic comorbidities, this is not prohibitive.  BNP is elevated, but chest x-ray without acute edema. -- continue to monitor volume status closely as he may require diuretics post procedure --Follow CBC, would transfuse for hemoglobin less than 8  2.  Chronic atrial fibrillation: Had previously been placed on Eliquis and developed severe spontaneous hemorrhage with right BKA.  Decision has been made to keep him off of anticoagulation since that time.  He is currently in rate controlled atrial fibrillation.   --Continue home medications  3.  CAD status post PCI to RCA '02: Maintained on aspirin, and statin  4.  CKD stage III: Around 1.3-1.4.  Creatinine noted at 2.03 on admission.  Currently not on any renal insulting agents. --Daily BMET  5.  Hyperlipidemia: On statin  For questions or updates, please contact [place] Please consult [email or web address] for contact info under    Signed, Reino Bellis, NP  01/07/2020 3:32 PM

## 2020-01-07 NOTE — ED Notes (Signed)
Wife at bedside reports pt passing tarry stools this week.

## 2020-01-07 NOTE — Consult Note (Addendum)
Consultation  Referring Provider:  [facility]/ Yates Primary Care Physician:  [name], MD Primary Gastroenterologist:   Dr Earlean Shawl ?  Reason for Consultation: GI bleeding  HPI: [name] is a 86 y.o. male, who presented to the emergency room in the early hours this morning with complaints of chest pain which had been prolonged at home.  The chest pain did not respond to analgesic or 2 nitroglycerin. He had apparently had a similar episode about 24 hours prior.  His wife noted that he had been passing dark stools over the past few days. He does take baby aspirin once daily, no blood thinners. Patient has history of coronary artery disease status post remote PCI, congestive heart failure, severe orthostatic hypotension, chronic kidney disease,, chronic respiratory failure requiring home O2, chronic atrial fibrillation-again not anticoagulated, and is status post right BKA.  Labs in the ER show hemoglobin 8.6/hematocrit 29.1, stool documented Hemoccult positive. INR 1.2 Troponin I 51 and 157 elevated BNP 519 Reviewing prior labs hemoglobin was 11 hematocrit 33 in August 2021 and in April 2021 hemoglobin 10.9 hematocrit of 35.  EKG by report no ST changes  No endoscopic evaluation noted in epic or Care Everywhere. Patient and wife both believe patient had at least 1 colonoscopy with Dr. Earlean Shawl  in the past, the last one would have been at least 10 years ago, no prior EGD to their knowledge. No prior history of GI bleeding. Patient's wife relates that he has had some problems with constipation especially since he had AKA amputation about a year and a half ago.  She has kept him on MiraLAX daily and usually has a bowel movement every 1 to 2 days.  She thinks that perhaps she has noticed that his stools have been a bit darker gradually over the past few weeks.  He has not had any increased frequency in bowel movements, no diarrhea, no hematochezia. Patient denies any abdominal pain, no  recent nausea or vomiting, no dysphagia or odynophagia and no chronic problems with heartburn or indigestion.  He received Dilaudid in the emergency room and says the head ever since he had the pain medicine he feels just fine.  He denies any shortness of breath.   Past Medical History:  Diagnosis Date  . Abnormality of gait 09/22/2015  . Arthritis   . Atrial fibrillation ([place])   . CAD (coronary artery disease)    Stent to RCA, Penta stent, 99% reduced to 0% 2002.  . Cancer ([place])    skin CA removed from back  . Chronic insomnia 03/30/2015  . Complication of anesthesia    trouble waking up  . GERD (gastroesophageal reflux disease)   . Hypercholesteremia   . Hypertension   . Hypothyroidism   . Memory difficulty 09/22/2015  . Osteoarthritis   . Pneumonia   . Seizures ([place])   . Sepsis ([place]) 05/2017  . Transient alteration of awareness 03/30/2015  . Vertigo    hx of    Past Surgical History:  Procedure Laterality Date  . AMPUTATION Right 03/28/2018   Procedure: AMPUTATION BELOW KNEE;  Surgeon: Newt Minion, MD;  Location: [facility];  Service: Orthopedics;  Laterality: Right;  . APPLICATION OF WOUND VAC Right 01/23/2019   Procedure: Application Of  Prevena Wound Vac;  Surgeon: Newt Minion, MD;  Location: [facility];  Service: Orthopedics;  Laterality: Right;  . BACK SURGERY    . EYE SURGERY     Bilateral Cataract surgery   . HERNIA  REPAIR    . I & D EXTREMITY Right 05/10/2015   Procedure: IRRIGATION AND DEBRIDEMENT EXTREMITY;  Surgeon: Leanora Cover, MD;  Location: [facility];  Service: Orthopedics;  Laterality: Right;  . KNEE ARTHROPLASTY     right knee X 2; left knee once  . LAMINECTOMY     X 6  . LEG AMPUTATION BELOW KNEE Right 03/28/2018  . POSTERIOR CERVICAL FUSION/FORAMINOTOMY  01/28/2012   Procedure: POSTERIOR CERVICAL FUSION/FORAMINOTOMY LEVEL 3;  Surgeon: [name], MD;  Location: [facility];  Service: Neurosurgery;  Laterality: Left;  Posterior  Cervical Five-Thoracic One Fusion, Arthrodesis with LEFT Cervical Seven-thoracic One Laminectomy, Foraminotomy and Resection of Synovial Cyst  . POSTERIOR CERVICAL FUSION/FORAMINOTOMY N/A 01/29/2013   Procedure: POSTERIOR CERVICAL FUSION/FORAMINOTOMY LEVEL 1 and C2-5 Posteriolateral Arthrodesis;  Surgeon: [name], MD;  Location: [facility];  Service: Neurosurgery;  Laterality: N/A;  C2-C3 Laminectomy C2-C3 posterior cervical arthrodesis  . STUMP REVISION Right 01/23/2019   Procedure: REVISION RIGHT BELOW KNEE AMPUTATION;  Surgeon: Newt Minion, MD;  Location: [facility];  Service: Orthopedics;  Laterality: Right;  . TONSILLECTOMY      Prior to Admission medications   Medication Sig Start Date End Date Taking? Authorizing Provider  alendronate (FOSAMAX) 70 MG tablet Take 70 mg by mouth every Wednesday. Remain upright for 30-60 minutes. 01/06/12  Yes [provider]  aspirin EC 81 MG tablet Take 81 mg by mouth daily.   Yes [provider]  Calcium Carb-Cholecalciferol (CALCIUM 600+D) 600-800 MG-UNIT TABS Take 1 tablet by mouth daily.   Yes [provider]  diltiazem (CARDIZEM CD) 240 MG 24 hr capsule TAKE 1 CAPSULE BY MOUTH EVERY MORNING. HOLD IF SYSTOLIC BLOOD PRESSURE IS LESS THAN 100 Patient taking differently: Take 240 mg by mouth daily.  12/10/19  Yes Croitoru, Mihai, MD  divalproex (DEPAKOTE ER) 250 MG 24 hr tablet Take 250 mg by mouth in the morning and at bedtime.    Yes [provider]  doxazosin (CARDURA) 4 MG tablet TAKE 1 TABLET(4 MG) BY MOUTH AT BEDTIME Patient taking differently: Take 4 mg by mouth at bedtime.  12/17/19  Yes Croitoru, Mihai, MD  furosemide (LASIX) 20 MG tablet Take 1 tablet (20 mg total) by mouth daily. 07/20/19 01/06/29 Yes Croitoru, Mihai, MD  HYDROcodone-acetaminophen (NORCO) 10-325 MG tablet Take 1 tablet by mouth every 4 (four) hours as needed for pain. 06/23/19  Yes [provider]  levothyroxine (SYNTHROID,  LEVOTHROID) 150 MCG tablet Take 150 mcg by mouth daily before breakfast. 12/27/11  Yes [provider]  meloxicam (MOBIC) 15 MG tablet Take 15 mg by mouth daily. 10/15/19  Yes [provider]  metoprolol tartrate (LOPRESSOR) 25 MG tablet TAKE 1 TABLET BY MOUTH TWICE DAILY WITH A MEAL OR IMMEDIATELY AFTER Patient taking differently: Take 25 mg by mouth 2 (two) times daily.  08/06/19  Yes Croitoru, Mihai, MD  Multiple Vitamins-Minerals (CERTAVITE/ANTIOXIDANTS) TABS Take 1 tablet by mouth every evening.    Yes [provider]  nitrofurantoin (MACRODANTIN) 100 MG capsule Take 100 mg by mouth daily. 09/30/19  Yes [provider]  nitroGLYCERIN (NITROSTAT) 0.4 MG SL tablet Place 1 tablet (0.4 mg total) under the tongue every 5 (five) minutes as needed for chest pain (call 911 afer 3 doses and if chest pain persists). 06/19/18  Yes Croitoru, Mihai, MD  polyethylene glycol (MIRALAX / GLYCOLAX) 17 g packet Take 17 g by mouth at bedtime.   Yes [provider]  potassium chloride SA (KLOR-CON) 20 MEQ tablet Take 0.5 tablets (10 mEq total) by mouth daily. 10/22/19  Yes Croitoru, Mihai, MD  pravastatin (PRAVACHOL) 40 MG tablet Take 40 mg by mouth every evening.  10/29/14  Yes [provider]  predniSONE (DELTASONE) 1 MG tablet Take 9 mg by mouth daily. 06/17/19  Yes [provider]    Current Facility-Administered Medications  Medication Dose Route Frequency Provider Last Rate Last Admin  . acetaminophen (TYLENOL) tablet 650 mg  650 mg Oral Q6H PRN Karmen Bongo, MD       Or  . acetaminophen (TYLENOL) suppository 650 mg  650 mg Rectal Q6H PRN Karmen Bongo, MD      . diltiazem (CARDIZEM CD) 24 hr capsule 240 mg  240 mg Oral Daily Karmen Bongo, MD      . divalproex (DEPAKOTE ER) 24 hr tablet 250 mg  250 mg Oral BID Karmen Bongo, MD      . doxazosin (CARDURA) tablet 4 mg  4 mg Oral QHS Karmen Bongo, MD      . hydrALAZINE (APRESOLINE) injection 5  mg  5 mg Intravenous Q4H PRN Karmen Bongo, MD      . HYDROcodone-acetaminophen [facility]) 10-325 MG per tablet 1 tablet  1 tablet Oral Q4H PRN Karmen Bongo, MD      . lactated ringers infusion   Intravenous Continuous Karmen Bongo, MD      . Derrill Memo ON 01/08/2020] levothyroxine (SYNTHROID) tablet 150 mcg  150 mcg Oral QAC breakfast Karmen Bongo, MD      . metoprolol tartrate (LOPRESSOR) tablet 25 mg  25 mg Oral BID Karmen Bongo, MD      . morphine 2 MG/ML injection 2 mg  2 mg Intravenous Q2H PRN Karmen Bongo, MD      . ondansetron [facility]) tablet 4 mg  4 mg Oral Q6H PRN Karmen Bongo, MD       Or  . ondansetron [facility]) injection 4 mg  4 mg Intravenous Q6H PRN Karmen Bongo, MD      . pantoprazole (PROTONIX) 80 mg in sodium chloride 0.9 % 100 mL (0.8 mg/mL) infusion  8 mg/hr Intravenous Continuous Karmen Bongo, MD 10 mL/hr at 01/07/20 0922 8 mg/hr at 01/07/20 0922  . pravastatin (PRAVACHOL) tablet 40 mg  40 mg Oral QPM Karmen Bongo, MD      . predniSONE (DELTASONE) tablet 9 mg  9 mg Oral Daily Karmen Bongo, MD      . sodium chloride flush (NS) 0.9 % injection 3 mL  3 mL Intravenous Q12H Karmen Bongo, MD       Current Outpatient Medications  Medication Sig Dispense Refill  . alendronate (FOSAMAX) 70 MG tablet Take 70 mg by mouth every Wednesday. Remain upright for 30-60 minutes.    Marland Kitchen aspirin EC 81 MG tablet Take 81 mg by mouth daily.    . Calcium Carb-Cholecalciferol (CALCIUM 600+D) 600-800 MG-UNIT TABS Take 1 tablet by mouth daily.    Marland Kitchen diltiazem (CARDIZEM CD) 240 MG 24 hr capsule TAKE 1 CAPSULE BY MOUTH EVERY MORNING. HOLD IF SYSTOLIC BLOOD PRESSURE IS LESS THAN 100 (Patient taking differently: Take 240 mg by mouth daily. ) 90 capsule 3  . divalproex (DEPAKOTE ER) 250 MG 24 hr tablet Take 250 mg by mouth in the morning and at bedtime.     Marland Kitchen doxazosin (CARDURA) 4 MG tablet TAKE 1 TABLET(4 MG) BY MOUTH AT BEDTIME (Patient taking differently: Take 4 mg by mouth at  bedtime. ) 90  tablet 3  . furosemide (LASIX) 20 MG tablet Take 1 tablet (20 mg total) by mouth daily. 90 tablet 3  . HYDROcodone-acetaminophen (NORCO) 10-325 MG tablet Take 1 tablet by mouth every 4 (four) hours as needed for pain.    Marland Kitchen levothyroxine (SYNTHROID, LEVOTHROID) 150 MCG tablet Take 150 mcg by mouth daily before breakfast.    . meloxicam (MOBIC) 15 MG tablet Take 15 mg by mouth daily.    . metoprolol tartrate (LOPRESSOR) 25 MG tablet TAKE 1 TABLET BY MOUTH TWICE DAILY WITH A MEAL OR IMMEDIATELY AFTER (Patient taking differently: Take 25 mg by mouth 2 (two) times daily. ) 180 tablet 2  . Multiple Vitamins-Minerals (CERTAVITE/ANTIOXIDANTS) TABS Take 1 tablet by mouth every evening.     . nitrofurantoin (MACRODANTIN) 100 MG capsule Take 100 mg by mouth daily.    . nitroGLYCERIN (NITROSTAT) 0.4 MG SL tablet Place 1 tablet (0.4 mg total) under the tongue every 5 (five) minutes as needed for chest pain (call 911 afer 3 doses and if chest pain persists). 25 tablet 11  . polyethylene glycol (MIRALAX / GLYCOLAX) 17 g packet Take 17 g by mouth at bedtime.    . potassium chloride SA (KLOR-CON) 20 MEQ tablet Take 0.5 tablets (10 mEq total) by mouth daily. 45 tablet 3  . pravastatin (PRAVACHOL) 40 MG tablet Take 40 mg by mouth every evening.   2  . predniSONE (DELTASONE) 1 MG tablet Take 9 mg by mouth daily.      Allergies as of 01/07/2020 - Review Complete 01/07/2020  Allergen Reaction Noted  . Eliquis [apixaban] Other (See Comments) 10/22/2017  . Demerol [meperidine] Nausea And Vomiting 01/18/2012  . Keppra [levetiracetam] Other (See Comments) 09/22/2015  . Vimpat [lacosamide]  01/21/2019    Family History  Problem Relation Age of Onset  . Hypertension Mother   . Cancer Mother   . Kidney failure Father   . Heart disease Father     Social History   Socioeconomic History  . Marital status: Married    Spouse name: Berlyn Malina  . Number of children: 3  . Years of education: 23  .  Highest education level: Professional school degree (e.g., MD, DDS, DVM, JD)  Occupational History  . Occupation: Retired Software engineer  Tobacco Use  . Smoking status: Former Smoker    Packs/day: 1.00    Years: 5.00    Pack years: 5.00  . Smokeless tobacco: Never Used  Vaping Use  . Vaping Use: Never used  Substance and Sexual Activity  . Alcohol use: Not Currently    Comment: rare  . Drug use: No  . Sexual activity: Not Currently  Other Topics Concern  . Not on file  Social History Narrative   Lives at home w/ his wife [name]   Patient drinks 4-5 cups of coffee daily.   Patient is right handed.    Social Determinants of Health   Financial Resource Strain: Low Risk   . Difficulty of Paying Living Expenses: Not hard at all  Food Insecurity: No Food Insecurity  . Worried About Charity fundraiser in the Last Year: Never true  . Ran Out of Food in the Last Year: Never true  Transportation Needs: No Transportation Needs  . Lack of Transportation (Medical): No  . Lack of Transportation (Non-Medical): No  Physical Activity: Inactive  . Days of Exercise per Week: 0 days  . Minutes of Exercise per Session: 0 min  Stress: No Stress Concern Present  .  Feeling of Stress : Only a little  Social Connections: Moderately Integrated  . Frequency of Communication with Friends and Family: Twice a week  . Frequency of Social Gatherings with Friends and Family: More than three times a week  . Attends Religious Services: 1 to 4 times per year  . Active Member of Clubs or Organizations: No  . Attends Archivist Meetings: Never  . Marital Status: Married  Human resources officer Violence: Not At Risk  . Fear of Current or Ex-Partner: No  . Emotionally Abused: No  . Physically Abused: No  . Sexually Abused: No    Review of Systems: Pertinent positive and negative review of systems were noted in the above HPI section.  All other review of systems was otherwise negative. Physical  Exam: Vital signs in last 24 hours: Temp:  [97.7 F (36.5 C)-98.3 F (36.8 C)] 98.3 F (36.8 C) (10/28 0915) Pulse Rate:  [49-95] 82 (10/28 1030) Resp:  [10-24] 13 (10/28 1030) BP: (132-172)/(67-100) 147/85 (10/28 1030) SpO2:  [93 %-100 %] 100 % (10/28 1030)   General:   Alert,  Well-developed, well-nourished, elderly white male pleasant and cooperative in NAD.  Wife at bedside Head:  Normocephalic and atraumatic. Eyes:  Sclera clear, no icterus.   Conjunctiva pale Ears:  Normal auditory acuity. Nose:  No deformity, discharge,  or lesions. Mouth:  No deformity or lesions.   Neck:  Supple; no masses or thyromegaly. Lungs:  Clear throughout to auscultation.   No wheezes, crackles, or rhonchi. Heart:  irRegular rate and rhythm; no murmurs, clicks, rubs,  or gallops. Abdomen:  Soft,nontender, BS active,nonpalp mass or hsm.   Rectal: Not done, stool documented heme positive this morning Msk:  Symmetrical without gross deformities. . Pulses:  Normal pulses noted. Extremities: Status post left AKA Neurologic:  Alert and  oriented x4;  grossly normal neurologically. Skin:  Intact without significant lesions or rashes.. Psych:  Alert and cooperative. Normal mood and affect.  Intake/Output from previous day: No intake/output data recorded. Intake/Output this shift: Total I/O In: 100 [IV Piggyback:100] Out: -   Lab Results: Recent Labs    01/07/20 0507  WBC 10.7*  HGB 8.6*  HCT 29.1*  PLT 127*   BMET Recent Labs    01/07/20 0507  NA 141  K 4.4  CL 106  CO2 23  GLUCOSE 92  BUN 49*  CREATININE 2.03*  CALCIUM 9.0   LFT Recent Labs    01/07/20 0507  PROT 5.2*  ALBUMIN 3.2*  AST 19  ALT 11  ALKPHOS 52  BILITOT 0.9   PT/INR Recent Labs    01/07/20 0828  LABPROT 14.3  INR 1.2        IMPRESSION:   #44 86 year old white male with history of coronary artery disease status post remote PCI, history of congestive heart failure and chronic atrial fibrillation  on baby aspirin only who presented to the emergency room early this morning after a prolonged episode of chest pain lasting a couple of hours and not responsive to analgesic and 2 nitroglycerin.  No symptoms currently post Dilaudid. Patient has elevated BNP and elevated troponin x2  Suspect demand ischemia versus non-STE MI  #2 anemia, macrocytic with 2.5 g drop in hemoglobin over the past 2 months, heme positive stool and dark stools noted at home over the past couple of weeks. Suspect patient has had a slow GI bleed probably upper though cannot rule out lower.  BUN of 49 supports upper source. Rule out  esophagitis, peptic ulcer disease, gastropathy, AVMs, neoplasm  #3 chronic respiratory failure-on chronic O2 at home 4.  Chronic atrial fibrillation 5.  Chronic kidney disease 6.  History of severe orthostatic hypotension 7.  Status post left BKA  Plan; IV PPI infusion has been started, continue Start full liquid diet, n.p.o. after midnight Serial hemoglobins and would have low threshold for transfusing for hemoglobin 8 or less given advanced age cardiac disease and symptomatic  Cardiology consultation-discussed with Dr. Lorin Mercy Will need EGD, possibly tomorrow.  Await cardiology recommendations. Thank you we will follow with you    Amy Esterwood PA-C 01/07/2020, 11:29 AM  I have also personally evaluated the patient taking history and examined the patient spoke to his wife.  Also spoke to Dr. Debara Pickett who does not think any additional cardiac work-up is necessary in this setting regarding need for sedation and upper GI endoscopy.  Risk factors for bleeding are aspirin, meloxicam use, and Fosamax use.  Plan for EGD with propofol tomorrow afternoon.  The risks and benefits as well as alternatives of endoscopic procedure(s) have been discussed and reviewed. All questions answered. The patient agrees to proceed.   Gatha Mayer, MD, [facility] 01/07/2020 5:58  PM

## 2020-01-07 NOTE — ED Notes (Signed)
Pa at bedside, pt resting in stretcher nadn

## 2020-01-07 NOTE — H&P (Signed)
History and Physical    [name] YYT:[id number] DOB: 12-Aug-1933 DOA: 01/07/2020  PCP: [name], MD Consultants:  [name] - orthopedics; Croitoru - cardiology; Medoff - GI (remotely) Patient coming from:  Home - lives with wife; NOK: [name] [phone]; [phone]  Chief Complaint:  [name] stools  HPI: [name] is a 86 y.o. male with medical history significant of seizures; dementia; hypothyroidism; HTN; HLD; CAD with stent; PVD s/p R BKA; and afib not on AC (severe spontaneous L leg hemorrhage) presenting with melena.  He reports that he felt like he was having a heart attack.  He developed onset of substernal CP.  It came on after dinner the last 2 nights.  It was fairly acute both nights.  He took Rolaids the first night and it seemed to help and then went to sleep with Norco.  Last night, he was unable to sleep due to the pain.  The pain is not present at this time due to a shot of Dilaudid but was there until he got the medication.  No nausea or vomiting.  He has had dark stools for the last several days.  He has irregular BMs and takes Miralax nightly.  He is not physically active due to his BKA.  No increase in belching but he has been taking more Rolaids regularly over the last few weeks.  Some SOB with ambulation.  No h/o GI bleeding.  He takes ASA 81 mg daily but no other NSAIDs other than rub or patch according to patient and wife, but he is taking daily Mobic based on his Med Rec.  He is not ambulatory, uses a sliding board for transfers.  Stopped PT about 6 weeks ago, noticing ulcers on his stump that are improving.  Last saw the PA on 10/19 for this issue.  He is extremely slow to heal and bruises easily.   ED Course:  Atypical CP starting last night, appears to be GI bleed.  Hgb 2-3 lower than August.  Dark black stools for several days, heme positive.  Elevated troponin at 151, no Heparin.  Has not called cards or GI.   Review of Systems: As per HPI;  otherwise review of systems reviewed and negative.   Ambulatory Status:  Non-ambulatory  COVID Vaccine Status:   Complete  Past Medical History:  Diagnosis Date  . Abnormality of gait 09/22/2015  . Arthritis   . Atrial fibrillation ([place])   . CAD (coronary artery disease)    Stent to RCA, Penta stent, 99% reduced to 0% 2002.  . Cancer ([place])    skin CA removed from back  . Chronic insomnia 03/30/2015  . Complication of anesthesia    trouble waking up  . GERD (gastroesophageal reflux disease)   . GI bleeding 01/07/2020  . Hypercholesteremia   . Hypertension   . Hypothyroidism   . Memory difficulty 09/22/2015  . Osteoarthritis   . Pneumonia   . Seizures ([place])   . Sepsis ([place]) 05/2017  . Transient alteration of awareness 03/30/2015  . Vertigo    hx of    Past Surgical History:  Procedure Laterality Date  . AMPUTATION Right 03/28/2018   Procedure: AMPUTATION BELOW KNEE;  Surgeon: Newt Minion, MD;  Location: [facility];  Service: Orthopedics;  Laterality: Right;  . APPLICATION OF WOUND VAC Right 01/23/2019   Procedure: Application Of  Prevena Wound Vac;  Surgeon: Newt Minion, MD;  Location: [facility];  Service: Orthopedics;  Laterality: Right;  .  BACK SURGERY    . EYE SURGERY     Bilateral Cataract surgery   . HERNIA REPAIR    . I & D EXTREMITY Right 05/10/2015   Procedure: IRRIGATION AND DEBRIDEMENT EXTREMITY;  Surgeon: Leanora Cover, MD;  Location: [facility];  Service: Orthopedics;  Laterality: Right;  . KNEE ARTHROPLASTY     right knee X 2; left knee once  . LAMINECTOMY     X 6  . LEG AMPUTATION BELOW KNEE Right 03/28/2018  . POSTERIOR CERVICAL FUSION/FORAMINOTOMY  01/28/2012   Procedure: POSTERIOR CERVICAL FUSION/FORAMINOTOMY LEVEL 3;  Surgeon: [name], MD;  Location: [facility];  Service: Neurosurgery;  Laterality: Left;  Posterior Cervical Five-Thoracic One Fusion, Arthrodesis with LEFT Cervical Seven-thoracic One Laminectomy, Foraminotomy and  Resection of Synovial Cyst  . POSTERIOR CERVICAL FUSION/FORAMINOTOMY N/A 01/29/2013   Procedure: POSTERIOR CERVICAL FUSION/FORAMINOTOMY LEVEL 1 and C2-5 Posteriolateral Arthrodesis;  Surgeon: [name], MD;  Location: [facility];  Service: Neurosurgery;  Laterality: N/A;  C2-C3 Laminectomy C2-C3 posterior cervical arthrodesis  . STUMP REVISION Right 01/23/2019   Procedure: REVISION RIGHT BELOW KNEE AMPUTATION;  Surgeon: Newt Minion, MD;  Location: [facility];  Service: Orthopedics;  Laterality: Right;  . TONSILLECTOMY      Social History   Socioeconomic History  . Marital status: Married    Spouse name: [name]  . Number of children: 3  . Years of education: 71  . Highest education level: Professional school degree (e.g., MD, DDS, DVM, JD)  Occupational History  . Occupation: Retired Software engineer  Tobacco Use  . Smoking status: Former Smoker    Packs/day: 1.00    Years: 5.00    Pack years: 5.00  . Smokeless tobacco: Never Used  Vaping Use  . Vaping Use: Never used  Substance and Sexual Activity  . Alcohol use: Not Currently    Comment: rare  . Drug use: No  . Sexual activity: Not Currently  Other Topics Concern  . Not on file  Social History Narrative   Lives at home w/ his wife [name]   Patient drinks 4-5 cups of coffee daily.   Patient is right handed.    Social Determinants of Health   Financial Resource Strain: Low Risk   . Difficulty of Paying Living Expenses: Not hard at all  Food Insecurity: No Food Insecurity  . Worried About Charity fundraiser in the Last Year: Never true  . Ran Out of Food in the Last Year: Never true  Transportation Needs: No Transportation Needs  . Lack of Transportation (Medical): No  . Lack of Transportation (Non-Medical): No  Physical Activity: Inactive  . Days of Exercise per Week: 0 days  . Minutes of Exercise per Session: 0 min  Stress: No Stress Concern Present  . Feeling of Stress : Only a little  Social  Connections: Moderately Integrated  . Frequency of Communication with Friends and Family: Twice a week  . Frequency of Social Gatherings with Friends and Family: More than three times a week  . Attends Religious Services: 1 to 4 times per year  . Active Member of Clubs or Organizations: No  . Attends Archivist Meetings: Never  . Marital Status: Married  Human resources officer Violence: Not At Risk  . Fear of Current or Ex-Partner: No  . Emotionally Abused: No  . Physically Abused: No  . Sexually Abused: No    Allergies  Allergen Reactions  . Eliquis [Apixaban] Other (See Comments)  bleeding  . Demerol [Meperidine] Nausea And Vomiting  . Keppra [Levetiracetam] Other (See Comments)    Causes sleepiness, mental status changes  . Vimpat [Lacosamide]     Over sedated     Family History  Problem Relation Age of Onset  . Hypertension Mother   . Cancer Mother   . Kidney failure Father   . Heart disease Father     Prior to Admission medications   Medication Sig Start Date End Date Taking? Authorizing Provider  alendronate (FOSAMAX) 70 MG tablet Take 70 mg by mouth every Wednesday. Remain upright for 30-60 minutes. 01/06/12   [provider]  aspirin EC 81 MG tablet Take 81 mg by mouth daily.    [provider]  Calcium Carb-Cholecalciferol (CALCIUM 600+D) 600-800 MG-UNIT TABS Take 1 tablet by mouth daily.    [provider]  diltiazem (CARDIZEM CD) 240 MG 24 hr capsule TAKE 1 CAPSULE BY MOUTH EVERY MORNING. HOLD IF SYSTOLIC BLOOD PRESSURE IS LESS THAN 100 12/10/19   Croitoru, Mihai, MD  divalproex (DEPAKOTE ER) 250 MG 24 hr tablet Take 250-500 mg by mouth See admin instructions. Take 250 mg in the morning and 500 mg in the evening    [provider]  doxazosin (CARDURA) 4 MG tablet TAKE 1 TABLET(4 MG) BY MOUTH AT BEDTIME 12/17/19   Croitoru, Mihai, MD  furosemide (LASIX) 20 MG tablet Take 1 tablet (20 mg total) by mouth daily. 07/20/19 10/18/19   Croitoru, Mihai, MD  HYDROcodone-acetaminophen (NORCO) 10-325 MG tablet Take 1 tablet by mouth every 4 (four) hours as needed for pain. 06/23/19   [provider]  ketoconazole (NIZORAL) 2 % shampoo Apply 1 application topically 2 (two) times a week. 06/20/19   [provider]  levothyroxine (SYNTHROID, LEVOTHROID) 150 MCG tablet Take 150 mcg by mouth daily before breakfast. 12/27/11   [provider]  lidocaine (LIDODERM) 5 % Place 1 patch onto the skin daily as needed (pain). Remove & Discard patch within 12 hours or as directed by MD    [provider]  meloxicam (MOBIC) 15 MG tablet Take 15 mg by mouth daily. 10/15/19   [provider]  metoprolol tartrate (LOPRESSOR) 25 MG tablet TAKE 1 TABLET BY MOUTH TWICE DAILY WITH A MEAL OR IMMEDIATELY AFTER 08/06/19   Croitoru, Mihai, MD  Multiple Vitamins-Minerals (CERTAVITE/ANTIOXIDANTS) TABS Take 1 tablet by mouth every evening.     [provider]  mupirocin ointment (BACTROBAN) 2 % Apply 1 application topically daily. Apply to right lateral leg wound daily 09/30/19   Edrick Kins, DPM  nitrofurantoin (MACRODANTIN) 100 MG capsule Take 100 mg by mouth daily. 09/30/19   [provider]  nitroGLYCERIN (NITROSTAT) 0.4 MG SL tablet Place 1 tablet (0.4 mg total) under the tongue every 5 (five) minutes as needed for chest pain (call 911 afer 3 doses and if chest pain persists). 06/19/18   Croitoru, Mihai, MD  oxymetazoline (AFRIN) 0.05 % nasal spray Place 1 spray into both nostrils 2 (two) times daily as needed for congestion.    [provider]  pentoxifylline (TRENTAL) 400 MG CR tablet Take 1 tablet (400 mg total) by mouth 3 (three) times daily with meals. 12/03/19   Persons, Bevely Palmer, PA  potassium chloride SA (KLOR-CON) 20 MEQ tablet Take 0.5 tablets (10 mEq total) by mouth daily. 10/22/19   Croitoru, Mihai, MD  pravastatin (PRAVACHOL) 40 MG tablet Take 40 mg by mouth every evening.  10/29/14    [provider]  predniSONE (DELTASONE) 1 MG tablet Take 9 mg by mouth daily. 06/17/19   [provider]    Physical Exam: Vitals:   01/07/20 1345 01/07/20 1430 01/07/20 1500 01/07/20 1551  BP: 134/75 116/70 114/65 (!) 106/52  Pulse: 69 81 96 63  Resp: 14 12 (!) 25 20  Temp:    97.8 F (36.6 C)  TempSrc:    Oral  SpO2: 96% 100% 97% 95%  Height:    5\' 9"  (1.753 m)     . General:  Appears calm and comfortable and is NAD; not particularly interactive, wife mostly speaks for him . Eyes:  PERRL, EOMI, normal lids, iris . ENT:   Hard of hearing, grossly normal lips & tongue, mmm . Neck:  no LAD, masses or thyromegaly . Cardiovascular:  RRR, no m/r/g. No LE edema.  Marland Kitchen Respiratory:   CTA bilaterally with no wheezes/rales/rhonchi.  Normal respiratory effort. . Abdomen:  soft, ?mild midepigastric TTP, ND, NABS . Skin:  Superficial ulcers on BKA stump without apparent infection     . Musculoskeletal:  S/p R BKA . Psychiatric:  Flat mood and affect, speech sparse but appropriate, AOx3 . Neurologic:  CN 2-12 grossly intact, moves all extremities in coordinated fashion    Radiological Exams on Admission: Independently reviewed - see discussion in A/P where applicable  DG Chest 2 View  Result Date: 01/07/2020 CLINICAL DATA:  Chest pain EXAM: CHEST - 2 VIEW COMPARISON:  12/20/2018 FINDINGS: Streaky density at the left lung base, unchanged and attributed to scarring. There is no edema, consolidation, effusion, or pneumothorax. Normal heart size and mediastinal contours when allowing for rotation. Coronary atherosclerosis. Advanced lower thoracic and lumbar spine degeneration IMPRESSION: Scarring at the left lung base. No acute finding when compared to prior. Electronically Signed   By: Monte Fantasia M.D.   On: 01/07/2020 05:52    EKG: Independently reviewed.  Afib with rate 76; prolonged QTc 511; borderline T wave changes in inferior leads   Labs on Admission: I have  personally reviewed the available labs and imaging studies at the time of the admission.  Pertinent labs:   BUN 49/Creatinine 2.03/GFR 32; 33/1.33/57 on 8/6 HS troponin 151 WBC 10.7 Hgb 8.6; 11.0 on 8/6 -> 8.4 at 1643 Platelets 127 Heme positive Lipids on 8/6: 121/58/44/105  Assessment/Plan Principal Problem:   Acute upper GI bleeding Active Problems:   Essential hypertension   Hypothyroidism   Seizure disorder (HCC)   Acute kidney injury ([place])   Chronic pain   Stage 3a chronic kidney disease   Chronic diastolic (congestive) heart failure (HCC)   Chronic atrial fibrillation   S/P BKA (below knee amputation) unilateral, right (HCC)   Upper GI bleeding -Patient presented with acute onset of chest pain, although by description and with other features this appears to be more likely UGI discomfort -He has acute anemia (Hgb 8, down from 11), heme positive stools, and report of 2-3 days of tarry stools -Most likely diagnosis is gastric or duodenal ulcer vs. Esophagitis/duodenitis -His Hgb decreased to 8.6; 11.0 on 8/6.  -The patient is not tachycardic with normal blood pressure, suggesting subacute volume loss.  -Type and screen were done in ED.  -will admit to telemetry -GI consulted by ED, will follow up recommendations -NPO for possible EGD -LR at 100 mL/hr -Start IV pantoprazole bolus and infusion -Zofran IV for nausea -Avoid NSAIDs and SQ heparin -Maintain IV access (2 large bore IVs if possible). -Monitor closely and follow cbc q12h,  transfuse as necessary for Hgb <8. -Hold ASA, Mobic (consider d/c of Mobic), Fosamax -He takes 9 mg Prednisone daily for uncertain reason - consider tapering of this medication if possible  Chest pain -As noted above, most likely etiology appears to be GI in nature -However, the patient did have an elevated troponin with negative delta -This appears to be most likely associated with demand ischemia -Will request cardiology consultation for  clearance prior to EGD  AKI on stage 3a CKD -Likely mildly volume deficient in the setting of GI bleed -Will give IVF -Hold Lasix -Recheck BMP in AM  HTN -Continue Cardizem, Cardura, Lopressor  Hypothyroidism -Check TSH -Continue Synthroid at current dose for now  HLD -Continue Pravachol  Seizure d/o -Continue Depakote  Afib -Rate controlled with diltiazem, Lopressor -No longer a candidate for AC due to severe L leg hemorrhage  Chronic diastolic CHF -Hold Lasix due to apparent mild volume deficiency, as above  S/p R BKA -Superficial ulcers noted at base of stump without apparent infection -Will request wound care consultation  Prolonged QTc -Hold QT prolonging medications including Macrodantin (used for UTI prophylaxis)  Chronic pain -I have reviewed this patient in the [place] Controlled Substances Reporting System.  He is receiving medications from only one provider and appears to be taking them as prescribed. -He is not at particularly high risk of opioid misuse, diversion, or overdose. -Continue Norco for chronic pain -Hold Mobic and consider d/c    Note: This patient has been tested and is negative for the novel coronavirus COVID-19. The patient has been fully vaccinated against COVID-19.    DVT prophylaxis:  SCDs Code Status:  DNR - confirmed with patient/family Family Communication: Wife was present throughout evaluation Disposition Plan:  The patient is from: home  Anticipated d/c is to: home without [facility] services  Anticipated d/c date will depend on clinical response to treatment, but possibly as early as tomorrow if she has excellent response to treatment  Patient is currently: acutely ill Consults called: GI; cardiology  Admission status:  Admit - It is my clinical opinion that admission to INPATIENT is reasonable and necessary because of the expectation that this patient will require hospital care that crosses at least 2 midnights to treat this condition  based on the medical complexity of the problems presented.  Given the aforementioned information, the predictability of an adverse outcome is felt to be significant.    Karmen Bongo MD [facility]   How to contact the [facility] Attending or Consulting provider [place] or covering provider during after hours [place], for this patient?  1. Check the care team in [facility] and look for a) attending/consulting [facility] provider listed and b) the [facility] team listed 2. Log into [email or web address] and use [place]'s universal password to access. If you do not have the password, please contact the hospital operator. 3. Locate the [facility] provider you are looking for under [facility] and page to a number that you can be directly reached. 4. If you still have difficulty reaching the provider, please page the [facility] (Director on Call) for the Hospitalists listed on amion for assistance.   01/07/2020, 6:05 PM

## 2020-01-07 NOTE — ED Provider Notes (Signed)
ED ECG REPORT   Date: 01/07/2020 0456am  Rate: 76  Rhythm: atrial fibrillation  QRS Axis: normal  Intervals: normal  ST/T Wave abnormalities: normal  Conduction Disutrbances:none  Narrative Interpretation:   Old EKG Reviewed: unchanged  I have personally reviewed the EKG tracing and agree with the computerized printout as noted.   Patient seen/examined in the Emergency Department in conjunction with Advanced Practice Provider McDonald Patient reports sudden onset of chest pain Exam : awake/alert, no distress. Plan: chest pain evaluation, likely will need admission     Ripley Fraise, MD 01/07/20 3779

## 2020-01-07 NOTE — ED Triage Notes (Addendum)
Per [facility], pt coming from home with c/o chest pain. Sudden onset, Pt was hx right sided dka, bed bound. Takes routine hydrocodone but pain persisted since 330. Mild sob but denies any other s/s. Wears 2 liters [place]. Aao4, pt took 2 nitroglycerin pills  at home , no relief. Hx afib, controlled rate, 324mg  aspiring given in route, and 1 nitroglycerin pill. 20g in left [facility]

## 2020-01-08 ENCOUNTER — Encounter (HOSPITAL_COMMUNITY)
Admission: EM | Disposition: A | Discharge status: Home Health | Payer: Self-pay | Source: Home / Self Care | Attending: Family Medicine

## 2020-01-08 ENCOUNTER — Encounter (HOSPITAL_COMMUNITY): Payer: Self-pay | Admitting: Internal Medicine

## 2020-01-08 ENCOUNTER — Inpatient Hospital Stay (HOSPITAL_COMMUNITY): Payer: Medicare Other | Admitting: Anesthesiology

## 2020-01-08 DIAGNOSIS — I482 Chronic atrial fibrillation, unspecified: Secondary | ICD-10-CM

## 2020-01-08 DIAGNOSIS — R0789 Other chest pain: Secondary | ICD-10-CM

## 2020-01-08 DIAGNOSIS — Z89511 Acquired absence of right leg below knee: Secondary | ICD-10-CM

## 2020-01-08 DIAGNOSIS — R778 Other specified abnormalities of plasma proteins: Secondary | ICD-10-CM

## 2020-01-08 DIAGNOSIS — K264 Chronic or unspecified duodenal ulcer with hemorrhage: Secondary | ICD-10-CM | POA: Diagnosis not present

## 2020-01-08 DIAGNOSIS — N1831 Chronic kidney disease, stage 3a: Secondary | ICD-10-CM

## 2020-01-08 DIAGNOSIS — I5032 Chronic diastolic (congestive) heart failure: Secondary | ICD-10-CM

## 2020-01-08 HISTORY — PX: ESOPHAGOGASTRODUODENOSCOPY (EGD) WITH PROPOFOL: SHX5813

## 2020-01-08 HISTORY — PX: BIOPSY: SHX5522

## 2020-01-08 LAB — BASIC METABOLIC PANEL
Anion gap: 10 (ref 5–15)
BUN: 37 mg/dL — ABNORMAL HIGH (ref 8–23)
CO2: 23 mmol/L (ref 22–32)
Calcium: 8.3 mg/dL — ABNORMAL LOW (ref 8.9–10.3)
Chloride: 108 mmol/L (ref 98–111)
Creatinine, Ser: 1.6 mg/dL — ABNORMAL HIGH (ref 0.61–1.24)
GFR, Estimated: 42 mL/min — ABNORMAL LOW (ref >60)
Glucose, Bld: 80 mg/dL (ref 70–99)
Potassium: 4.2 mmol/L (ref 3.5–5.1)
Sodium: 141 mmol/L (ref 135–145)

## 2020-01-08 LAB — CBC
HCT: 26.6 % — ABNORMAL LOW (ref 39.0–52.0)
HCT: 28.9 % — ABNORMAL LOW (ref 39.0–52.0)
Hemoglobin: 8.2 g/dL — ABNORMAL LOW (ref 13.0–17.0)
Hemoglobin: 9 g/dL — ABNORMAL LOW (ref 13.0–17.0)
MCH: 31.7 pg (ref 26.0–34.0)
MCH: 32.4 pg (ref 26.0–34.0)
MCHC: 30.8 g/dL (ref 30.0–36.0)
MCHC: 31.1 g/dL (ref 30.0–36.0)
MCV: 102.7 fL — ABNORMAL HIGH (ref 80.0–100.0)
MCV: 104 fL — ABNORMAL HIGH (ref 80.0–100.0)
Platelets: 134 10*3/uL — ABNORMAL LOW (ref 150–400)
Platelets: 139 10*3/uL — ABNORMAL LOW (ref 150–400)
RBC: 2.59 MIL/uL — ABNORMAL LOW (ref 4.22–5.81)
RBC: 2.78 MIL/uL — ABNORMAL LOW (ref 4.22–5.81)
RDW: 14.6 % (ref 11.5–15.5)
RDW: 14.3 % (ref 11.5–15.5)
WBC: 7.9 10*3/uL (ref 4.0–10.5)
WBC: 8.8 10*3/uL (ref 4.0–10.5)
nRBC: 0 % (ref 0.0–0.2)
nRBC: 0 % (ref 0.0–0.2)

## 2020-01-08 LAB — TSH: TSH: 2.787 u[IU]/mL (ref 0.350–4.500)

## 2020-01-08 SURGERY — ESOPHAGOGASTRODUODENOSCOPY (EGD) WITH PROPOFOL
Anesthesia: Monitor Anesthesia Care

## 2020-01-08 MED ORDER — PROPOFOL 500 MG/50ML IV EMUL
INTRAVENOUS | Status: DC | PRN
  Administered 2020-01-08: 100 ug/kg/min via INTRAVENOUS

## 2020-01-08 SURGICAL SUPPLY — 15 items

## 2020-01-08 NOTE — Op Note (Signed)
[facility] Patient Name: [name] Procedure Date : 01/08/2020 MRN: [id number] Attending MD: Gatha Mayer , MD Date of Birth: 1933/08/14 CSN: [id number] Age: 86 Admit Type: Inpatient Procedure:                Upper GI endoscopy Indications:              Melena Providers:                Gatha Mayer, MD, Grace Isaac, RN, Tyrone Apple, Technician, Sampson Si, CRNA Referring MD:              Medicines:                Propofol per Anesthesia, Monitored Anesthesia Care Complications:            No immediate complications. Estimated Blood Loss:     Estimated blood loss was minimal. Procedure:                Pre-Anesthesia Assessment:                           - Prior to the procedure, a History and Physical                            was performed, and patient medications and                            allergies were reviewed. The patient's tolerance of                            previous anesthesia was also reviewed. The risks                            and benefits of the procedure and the sedation                            options and risks were discussed with the patient.                            All questions were answered, and informed consent                            was obtained. Prior Anticoagulants: The patient has                            taken no previous anticoagulant or antiplatelet                            agents. ASA Grade Assessment: III - A patient with                            severe systemic disease. After reviewing the risks  and benefits, the patient was deemed in                            satisfactory condition to undergo the procedure.                           After obtaining informed consent, the endoscope was                            passed under direct vision. Throughout the                            procedure, the patient's blood pressure, pulse, and                             oxygen saturations were monitored continuously. The                            GIF-H190 ([id number]) Olympus gastroscope was                            introduced through the mouth, and advanced to the                            second part of duodenum. The upper GI endoscopy was                            accomplished without difficulty. The patient                            tolerated the procedure well. Scope In: Scope Out: Findings:      One non-bleeding cratered duodenal ulcer with a clean ulcer base       (Forrest Class III) was found in the duodenal bulb. The lesion was 18 mm       in largest dimension.      A small hiatal hernia was present.      The exam was otherwise without abnormality.      The cardia and gastric fundus were normal on retroflexion.      Biopsies were taken with a cold forceps in the gastric antrum for       Helicobacter pylori testing using CLOtest. Verification of patient       identification for the specimen was done. Estimated blood loss was       minimal. Impression:               - Non-bleeding duodenal ulcer with a clean ulcer                            base (Forrest Class III).                           - Small hiatal hernia.                           - The examination was otherwise normal.                           -  Biopsies were taken with a cold forceps for                            Helicobacter pylori testing using CLOtest. Recommendation:           - Return patient to hospital ward for ongoing care.                           - Stay on PPI infusion til [place] healthy diet                           Spoke to wife                           No NSAID's Procedure Code(s):        --- Professional ---                           [phone], Esophagogastroduodenoscopy, flexible,                            transoral; with biopsy, single or multiple Diagnosis Code(s):        --- Professional ---                            K26.9, Duodenal ulcer, unspecified as acute or                            chronic, without hemorrhage or perforation                           K44.9, Diaphragmatic hernia without obstruction or                            gangrene                           K92.1, Melena (includes Hematochezia) CPT copyright 2019 American Medical Association. All rights reserved. The codes documented in this report are preliminary and upon coder review may  be revised to meet current compliance requirements. Gatha Mayer, MD 01/08/2020 2:50:17 PM This report has been signed electronically. Number of Addenda: 0

## 2020-01-08 NOTE — Interval H&P Note (Signed)
History and Physical Interval Note:  01/08/2020 1:56 PM  [name]  has presented today for surgery, with the diagnosis of GI bleed.  The various methods of treatment have been discussed with the patient and family. After consideration of risks, benefits and other options for treatment, the patient has consented to  Procedure(s): ESOPHAGOGASTRODUODENOSCOPY (EGD) WITH PROPOFOL (N/A) as a surgical intervention.  The patient's history has been reviewed, patient examined, no change in status, stable for surgery.  I have reviewed the patient's chart and labs.  Questions were answered to the patient's satisfaction.     Silvano Rusk

## 2020-01-08 NOTE — Anesthesia Procedure Notes (Signed)
Procedure Name: MAC Date/Time: 01/08/2020 2:01 PM Performed by: Barrington Ellison, CRNA Pre-anesthesia Checklist: Patient identified, Emergency Drugs available, Suction available, Patient being monitored and Timeout performed Patient Re-evaluated:Patient Re-evaluated prior to induction Oxygen Delivery Method: Nasal cannula

## 2020-01-08 NOTE — Anesthesia Postprocedure Evaluation (Signed)
Anesthesia Post Note  Patient: [name]  Procedure(s) Performed: ESOPHAGOGASTRODUODENOSCOPY (EGD) WITH PROPOFOL (N/A ) BIOPSY     Patient location during evaluation: PACU Anesthesia Type: MAC Level of consciousness: awake and alert Pain management: pain level controlled Vital Signs Assessment: post-procedure vital signs reviewed and stable Respiratory status: spontaneous breathing Cardiovascular status: stable Anesthetic complications: no   No complications documented.  Last Vitals:  Vitals:   01/08/20 1430 01/08/20 1450  BP: (!) 101/49 118/67  Pulse: 78 (!) 57  Resp: (!) 24 17  Temp: 36.5 C   SpO2:  95%    Last Pain:  Vitals:   01/08/20 1450  TempSrc:   PainSc: 0-No pain                 Nolon Nations

## 2020-01-08 NOTE — Progress Notes (Signed)
PROGRESS NOTE    [name]  ZOX:[id number] DOB: Feb 20, 1934 DOA: 01/07/2020 PCP: [name], MD   Brief Narrative:  HPI: [name] is a 86 y.o. male with medical history significant of seizures; dementia; hypothyroidism; HTN; HLD; CAD with stent; PVD s/p R BKA; and afib not on AC (severe spontaneous L leg hemorrhage) presenting with melena.  He reports that he felt like he was having a heart attack.  He developed onset of substernal CP.  It came on after dinner the last 2 nights.  It was fairly acute both nights.  He took Rolaids the first night and it seemed to help and then went to sleep with Norco.  Last night, he was unable to sleep due to the pain.  The pain is not present at this time due to a shot of Dilaudid but was there until he got the medication.  No nausea or vomiting.  He has had dark stools for the last several days.  He has irregular BMs and takes Miralax nightly.  He is not physically active due to his BKA.  No increase in belching but he has been taking more Rolaids regularly over the last few weeks.  Some SOB with ambulation.  No h/o GI bleeding.  He takes ASA 81 mg daily but no other NSAIDs other than rub or patch according to patient and wife, but he is taking daily Mobic based on his Med Rec.  He is not ambulatory, uses a sliding board for transfers.  Stopped PT about 6 weeks ago, noticing ulcers on his stump that are improving.  Last saw the PA on 10/19 for this issue.  He is extremely slow to heal and bruises easily.   ED Course:  Atypical CP starting last night, appears to be GI bleed.  Hgb 2-3 lower than August.  Dark black stools for several days, heme positive.  Elevated troponin at 151, no Heparin.  Has not called cards or GI.  Assessment & Plan:   Principal Problem:   Gastrointestinal hemorrhage Active Problems:   Essential hypertension   Hypothyroidism   Seizure disorder (HCC)   Acute kidney injury (HCC)   Chronic pain   Stage 3a chronic  kidney disease   Chronic diastolic (congestive) heart failure (HCC)   Elevated troponin   Chronic atrial fibrillation   S/P BKA (below knee amputation) unilateral, right (HCC)   Atypical chest pain   Upper GI bleeding/acute blood loss anemia: GI on board.  Plan for EGD and colonoscopy today.  Patient has not had any further dark stools since admission.  No hematemesis.  Hemoglobin fairly stable.  Continue Protonix drip.  Monitor H&H every day.  Chest pain: Likely etiology of this is GI/gastric problems.  He has no more chest pain today.  Slightly elevated troponin but flat.  Seen by cardiology, no concern from cardiology.  Chronic thrombocytopenia: Stable.  Watch daily.  AKI on stage 3a CKD slight improvement in creatinine.  This is likely due to GI bleed/dehydration.  Check labs in the morning.   HTN: Fairly controlled. -Continue Cardizem, Cardura, Lopressor  Hypothyroidism TSH normal. -Continue Synthroid at current dose for now  HLD -Continue Pravachol  Seizure d/o -Continue Depakote  Afib -Rate controlled with diltiazem, Lopressor -No longer a candidate for AC due to severe L leg hemorrhage  Chronic diastolic CHF -Hold Lasix due to apparent mild volume deficiency, as above.  He appears slightly dehydrated.  S/p R BKA -Superficial ulcers noted at base of stump  without apparent infection -Will request wound care consultation  Prolonged QTc -Hold QT prolonging medications including Macrodantin (used for UTI prophylaxis)  DVT prophylaxis: SCDs Start: 01/07/20 1031   Code Status: DNR  Family Communication:  None present at bedside.  Plan of care discussed with patient in length and he verbalized understanding and agreed with it.  Status is: Inpatient  Remains inpatient appropriate because:IV treatments appropriate due to intensity of illness or inability to take PO   Dispo: The patient is from: Home              Anticipated d/c is to: Home               Anticipated d/c date is: 1 day              Patient currently is not medically stable to d/c.        Estimated body mass index is 22.89 kg/m as calculated from the following:   Height as of this encounter: 5\' 9"  (1.753 m).   Weight as of 12/29/19: 70.3 kg.      Nutritional status:               Consultants:   GI  Procedures:   None  Antimicrobials:  Anti-infectives (From admission, onward)   Start     Dose/Rate Route Frequency Ordered Stop   01/07/20 1045  nitrofurantoin (MACRODANTIN) capsule 100 mg  Status:  Discontinued        100 mg Oral Daily 01/07/20 1032 01/07/20 1120         Subjective: Seen and examined this morning.  He had no complaints.  Objective: Vitals:   01/07/20 2031 01/08/20 0023 01/08/20 0450 01/08/20 0757  BP: (!) 134/58 133/72 (!) 145/85 (!) 152/84  Pulse: 64 75 87 75  Resp: 14 15 15 18   Temp: 97.8 F (36.6 C) 98.7 F (37.1 C) 98.6 F (37 C) 98.7 F (37.1 C)  TempSrc: Oral Oral Oral Oral  SpO2: 100% 96% 100% 96%  Height:        Intake/Output Summary (Last 24 hours) at 01/08/2020 1239 Last data filed at 01/08/2020 1205 Gross per 24 hour  Intake 1803.52 ml  Output 1450 ml  Net 353.52 ml   There were no vitals filed for this visit.  Examination:  General exam: Appears calm and comfortable  Respiratory system: Clear to auscultation. Respiratory effort normal. Cardiovascular system: S1 & S2 heard, RRR. No JVD, murmurs, rubs, gallops or clicks. No pedal edema. Gastrointestinal system: Abdomen is nondistended, soft and nontender. No organomegaly or masses felt. Normal bowel sounds heard. Central nervous system: Alert and oriented. No focal neurological deficits. Extremities: Symmetric 5 x 5 power. Skin: No rashes, lesions or ulcers Psychiatry: Judgement and insight appear normal. Mood & affect appropriate.    Data Reviewed: I have personally reviewed following labs and imaging studies  CBC: Recent Labs  Lab  01/07/20 0507 01/07/20 1643 01/08/20 0553  WBC 10.7* 9.0 7.9  HGB 8.6* 8.4* 8.2*  HCT 29.1* 27.5* 26.6*  MCV 110.6* 105.0* 102.7*  PLT 127* 123* 854*   Basic Metabolic Panel: Recent Labs  Lab 01/07/20 0507 01/07/20 0641 01/08/20 0553  NA 141  --  141  K 4.4  --  4.2  CL 106  --  108  CO2 23  --  23  GLUCOSE 92  --  80  BUN 49*  --  37*  CREATININE 2.03*  --  1.60*  CALCIUM 9.0  --  8.3*  MG  --  2.1  --    GFR: Estimated Creatinine Clearance: 34.2 mL/min (A) (by C-G formula based on SCr of 1.6 mg/dL (H)). Liver Function Tests: Recent Labs  Lab 01/07/20 0507  AST 19  ALT 11  ALKPHOS 52  BILITOT 0.9  PROT 5.2*  ALBUMIN 3.2*   No results for input(s): LIPASE, AMYLASE in the last 168 hours. No results for input(s): AMMONIA in the last 168 hours. Coagulation Profile: Recent Labs  Lab 01/07/20 0828  INR 1.2   Cardiac Enzymes: No results for input(s): CKTOTAL, CKMB, CKMBINDEX, TROPONINI in the last 168 hours. BNP (last 3 results) No results for input(s): PROBNP in the last 8760 hours. HbA1C: No results for input(s): HGBA1C in the last 72 hours. CBG: Recent Labs  Lab 01/07/20 0511  GLUCAP 89   Lipid Profile: No results for input(s): CHOL, HDL, LDLCALC, TRIG, CHOLHDL, LDLDIRECT in the last 72 hours. Thyroid Function Tests: Recent Labs    01/08/20 0553  TSH 2.787   Anemia Panel: No results for input(s): VITAMINB12, FOLATE, FERRITIN, TIBC, IRON, RETICCTPCT in the last 72 hours. Sepsis Labs: No results for input(s): PROCALCITON, LATICACIDVEN in the last 168 hours.  Recent Results (from the past 240 hour(s))  Respiratory Panel by RT PCR (Flu A&B, Covid) - Nasopharyngeal Swab     Status: None   Collection Time: 01/07/20  5:52 AM   Specimen: Nasopharyngeal Swab  Result Value Ref Range Status   SARS Coronavirus 2 by RT PCR NEGATIVE NEGATIVE Final    Comment: (NOTE) SARS-CoV-2 target nucleic acids are NOT DETECTED.  The SARS-CoV-2 RNA is generally  detectable in upper respiratoy specimens during the acute phase of infection. The lowest concentration of SARS-CoV-2 viral copies this assay can detect is 131 copies/mL. A negative result does not preclude SARS-Cov-2 infection and should not be used as the sole basis for treatment or other patient management decisions. A negative result may occur with  improper specimen collection/handling, submission of specimen other than nasopharyngeal swab, presence of viral mutation(s) within the areas targeted by this assay, and inadequate number of viral copies (<131 copies/mL). A negative result must be combined with clinical observations, patient history, and epidemiological information. The expected result is Negative.  Fact Sheet for Patients:  PinkCheek.be  Fact Sheet for Healthcare Providers:  GravelBags.it  This test is no t yet approved or cleared by the [place] FDA and  has been authorized for detection and/or diagnosis of SARS-CoV-2 by FDA under an Emergency Use Authorization (EUA). This EUA will remain  in effect (meaning this test can be used) for the duration of the COVID-19 declaration under Section 564(b)(1) of the Act, 21 U.S.C. section 360bbb-3(b)(1), unless the authorization is terminated or revoked sooner.     Influenza A by PCR NEGATIVE NEGATIVE Final   Influenza B by PCR NEGATIVE NEGATIVE Final    Comment: (NOTE) The Xpert Xpress SARS-CoV-2/FLU/RSV assay is intended as an aid in  the diagnosis of influenza from Nasopharyngeal swab specimens and  should not be used as a sole basis for treatment. Nasal washings and  aspirates are unacceptable for Xpert Xpress SARS-CoV-2/FLU/RSV  testing.  Fact Sheet for Patients: PinkCheek.be  Fact Sheet for Healthcare Providers: GravelBags.it  This test is not yet approved or cleared by the [place] FDA and    has been authorized for detection and/or diagnosis of SARS-CoV-2 by  FDA under an Emergency Use Authorization (EUA). This EUA will remain  in effect (meaning this  test can be used) for the duration of the  Covid-19 declaration under Section 564(b)(1) of the Act, 21  U.S.C. section 360bbb-3(b)(1), unless the authorization is  terminated or revoked. Performed at [facility], [place] [address] [place], [place] [address]       Radiology Studies: DG Chest 2 View  Result Date: 01/07/2020 CLINICAL DATA:  Chest pain EXAM: CHEST - 2 VIEW COMPARISON:  12/20/2018 FINDINGS: Streaky density at the left lung base, unchanged and attributed to scarring. There is no edema, consolidation, effusion, or pneumothorax. Normal heart size and mediastinal contours when allowing for rotation. Coronary atherosclerosis. Advanced lower thoracic and lumbar spine degeneration IMPRESSION: Scarring at the left lung base. No acute finding when compared to prior. Electronically Signed   By: Monte Fantasia M.D.   On: 01/07/2020 05:52    Scheduled Meds: . diltiazem  240 mg Oral Daily  . divalproex  250 mg Oral BID  . doxazosin  4 mg Oral QHS  . levothyroxine  150 mcg Oral QAC breakfast  . metoprolol tartrate  25 mg Oral BID  . pravastatin  40 mg Oral QPM  . predniSONE  9 mg Oral Daily  . sodium chloride flush  3 mL Intravenous Q12H   Continuous Infusions: . lactated ringers Stopped (01/08/20 0935)  . pantoprozole (PROTONIX) infusion 8 mg/hr (01/08/20 1205)     LOS: 1 day   Time spent: 37 minutes   Darliss Cheney, MD [facility]  01/08/2020, 12:39 PM   To contact the attending provider between 7A-7P or the covering provider during after hours 7P-7A, please log into the web site [email or web address].

## 2020-01-08 NOTE — Transfer of Care (Signed)
Immediate Anesthesia Transfer of Care Note  Patient: [name]  Procedure(s) Performed: ESOPHAGOGASTRODUODENOSCOPY (EGD) WITH PROPOFOL (N/A ) BIOPSY  Patient Location: Endoscopy Unit  Anesthesia Type:MAC  Level of Consciousness: drowsy and patient cooperative  Airway & Oxygen Therapy: Patient Spontanous Breathing and Patient connected to nasal cannula oxygen  Post-op Assessment: Report given to RN  Post vital signs: Reviewed and stable  Last Vitals:  Vitals Value Taken Time  BP 88/44 01/08/20 1432  Temp    Pulse 71 01/08/20 1438  Resp 19 01/08/20 1438  SpO2 92 % 01/08/20 1438  Vitals shown include unvalidated device data.  Last Pain:  Vitals:   01/08/20 1321  TempSrc: Temporal  PainSc: 0-No pain         Complications: No complications documented.

## 2020-01-08 NOTE — Consult Note (Signed)
[place] Nurse wound follow up Per [place] conversation with bedside nurse, Jerene Pitch. Wife is questioning why we are applying xeroform gauze on the stump when she has been applying Jergens lotion and a sock over the stump that has silver in it. Reviewed record and learned that Dr. [name] performed the BKA surgery and that he provides a silver and copper shrinker to his patients. At the request of the patient and spouse we will update orders for Aquacel if needed and cover with stump sock. Also received a [place] from [place], PA on Dr. Jess Barters service that this is the treatment Dr. [name] has been providing, so we will continue.   Thank you for the consult. [place] nurse will not follow at this time.   Please re-consult the [place] team if needed.  Cathlean Marseilles Tamala Julian, MSN, RN, [place], Lysle Pearl, [facility] Wound Treatment Associate Pager [phone]

## 2020-01-08 NOTE — Anesthesia Preprocedure Evaluation (Addendum)
Anesthesia Evaluation  Patient identified by MRN, date of birth, ID band Patient awake    Reviewed: Allergy & Precautions, NPO status , Patient's Chart, lab work & pertinent test results  History of Anesthesia Complications (+) history of anesthetic complications  Airway Mallampati: II  TM Distance: >3 FB Neck ROM: Full    Dental  (+) Dental Advisory Given, Teeth Intact   Pulmonary pneumonia, former smoker,    Pulmonary exam normal breath sounds clear to auscultation       Cardiovascular hypertension, Pt. on medications and Pt. on home beta blockers + CAD and +CHF  Normal cardiovascular exam Rhythm:Regular Rate:Normal  Echo 03/2018 - Left ventricle: The cavity size was normal. Wall thickness was increased in a pattern of moderate LVH. Systolic function was normal. The estimated ejection fraction was in the range of 55% to 60%. Left ventricular diastolic function parameters were normal.  - Aortic valve: There was mild regurgitation.  - Mitral valve: Calcified annulus. Mildly thickened leaflets .  There was mild regurgitation. Valve area by pressure half-time: 1.04 cm^2.  - Left atrium: The atrium was mildly dilated.  - Atrial septum: No defect or patent foramen ovale was identified.    Neuro/Psych TIAnegative psych ROS   GI/Hepatic Neg liver ROS, GERD  ,  Endo/Other  Hypothyroidism   Renal/GU Renal disease     Musculoskeletal   Abdominal   Peds  Hematology  (+) Blood dyscrasia, anemia ,   Anesthesia Other Findings   Reproductive/Obstetrics                           Anesthesia Physical  Anesthesia Plan  ASA: III  Anesthesia Plan: MAC   Post-op Pain Management:    Induction: Intravenous  PONV Risk Score and Plan: 1 and Propofol infusion, TIVA and Treatment may vary due to age or medical condition  Airway Management Planned: Natural Airway  Additional Equipment:  None  Intra-op Plan:   Post-operative Plan:   Informed Consent: I have reviewed the patients History and Physical, chart, labs and discussed the procedure including the risks, benefits and alternatives for the proposed anesthesia with the patient or authorized representative who has indicated his/her understanding and acceptance.   Patient has DNR.   Dental advisory given  Plan Discussed with: CRNA  Anesthesia Plan Comments:       Anesthesia Quick Evaluation

## 2020-01-08 NOTE — Consult Note (Addendum)
[place] Nurse Consult Note: Patient receiving care in [place] Remote consult Reason for Consult: BKA stump wounds Wound type: Healing wounds  Wound bed: dry crusting Drainage (amount, consistency, odor) None Periwound: Intact Dressing procedure/placement/frequency: Gently clean BKA stump with soap and water, then lightly scrub the area to get loose skin and crusting off. Apply Xeroform gauze Kellie Simmering # 294), cover with 4 x 4 and wrap with Kerlix. Change daily.   Monitor the wound area(s) for worsening of condition such as: Signs/symptoms of infection, increase in size, development of or worsening of odor, development of pain, or increased pain at the affected locations.   Notify the medical team if any of these develop.  Thank you for the consult. [place] nurse will not follow at this time.   Please re-consult the [place] team if needed.  Cathlean Marseilles Tamala Julian, MSN, RN, [place], Lysle Pearl, [facility] Wound Treatment Associate Pager [phone]

## 2020-01-09 DIAGNOSIS — D62 Acute posthemorrhagic anemia: Secondary | ICD-10-CM | POA: Diagnosis not present

## 2020-01-09 DIAGNOSIS — K264 Chronic or unspecified duodenal ulcer with hemorrhage: Secondary | ICD-10-CM | POA: Diagnosis not present

## 2020-01-09 LAB — CBC
HCT: 26.9 % — ABNORMAL LOW (ref 39.0–52.0)
Hemoglobin: 8.5 g/dL — ABNORMAL LOW (ref 13.0–17.0)
MCH: 32.9 pg (ref 26.0–34.0)
MCHC: 31.6 g/dL (ref 30.0–36.0)
MCV: 104.3 fL — ABNORMAL HIGH (ref 80.0–100.0)
Platelets: 136 10*3/uL — ABNORMAL LOW (ref 150–400)
RBC: 2.58 MIL/uL — ABNORMAL LOW (ref 4.22–5.81)
RDW: 14.5 % (ref 11.5–15.5)
WBC: 7.2 10*3/uL (ref 4.0–10.5)
nRBC: 0 % (ref 0.0–0.2)

## 2020-01-09 LAB — BASIC METABOLIC PANEL
Anion gap: 10 (ref 5–15)
BUN: 35 mg/dL — ABNORMAL HIGH (ref 8–23)
CO2: 23 mmol/L (ref 22–32)
Calcium: 8.1 mg/dL — ABNORMAL LOW (ref 8.9–10.3)
Chloride: 106 mmol/L (ref 98–111)
Creatinine, Ser: 1.66 mg/dL — ABNORMAL HIGH (ref 0.61–1.24)
GFR, Estimated: 40 mL/min — ABNORMAL LOW (ref >60)
Glucose, Bld: 85 mg/dL (ref 70–99)
Potassium: 4.3 mmol/L (ref 3.5–5.1)
Sodium: 139 mmol/L (ref 135–145)

## 2020-01-09 LAB — CLOTEST (H. PYLORI), BIOPSY: Helicobacter screen: NEGATIVE

## 2020-01-09 MED ORDER — PREDNISONE 5 MG PO TABS
8.0000 mg | ORAL_TABLET | Freq: Every day | ORAL | Status: DC
  Administered 2020-01-10 – 2020-01-11 (×2): 8 mg via ORAL
  Filled 2020-01-09 (×2): qty 3

## 2020-01-09 NOTE — Progress Notes (Addendum)
Patient ID: [name], male   DOB: 1933-05-07, 86 y.o.   MRN: [id number]     Progress Note   Subjective   day # 3  CC; GI bleed  EGD yesterday-1 nonbleeding cratered duodenal ulcer clean base 18 mm, small hiatal hernia CLOtest pending Hemoglobin 8.5/hematocrit 26.9 stable  Protonix infusion-  Wife at bedside, very concerned about his leg wound.  He is tolerating solid food, has no complaints of abdominal pain, no nausea or vomiting.    Objective   Vital signs in last 24 hours: Temp:  [97.3 F (36.3 C)-98.4 F (36.9 C)] 97.9 F (36.6 C) (10/30 0812) Pulse Rate:  [57-89] 82 (10/30 0812) Resp:  [11-24] 11 (10/30 0812) BP: (101-143)/(49-89) 137/89 (10/30 0812) SpO2:  [94 %-99 %] 96 % (10/30 0812) Last BM Date: 01/07/20 General:   Elderly white male in NAD Heart:  Regular rate and rhythm; no murmurs Lungs: Respirations even and unlabored, lungs CTA bilaterally Abdomen:  Soft, nontender and nondistended. Normal bowel sounds. Extremities: Status post right bka Neurologic:  Alert and oriented,  grossly normal neurologically. Psych:  Cooperative. Normal mood and affect.   Lab Results: Recent Labs    01/08/20 0553 01/08/20 1810 01/09/20 0240  WBC 7.9 8.8 7.2  HGB 8.2* 9.0* 8.5*  HCT 26.6* 28.9* 26.9*  PLT 134* 139* 136*   BMET Recent Labs    01/07/20 0507 01/08/20 0553 01/09/20 0240  NA 141 141 139  K 4.4 4.2 4.3  CL 106 108 106  CO2 23 23 23   GLUCOSE 92 80 85  BUN 49* 37* 35*  CREATININE 2.03* 1.60* 1.66*  CALCIUM 9.0 8.3* 8.1*   LFT Recent Labs    01/07/20 0507  PROT 5.2*  ALBUMIN 3.2*  AST 19  ALT 11  ALKPHOS 52  BILITOT 0.9   PT/INR Recent Labs    01/07/20 0828  LABPROT 14.3  INR 1.2       Assessment / Plan:    #7 86 year old white male with acute upper GI bleed associated with a prolonged episode of chest pain which resolved on admission and has not recurred.  Likely demand ischemia.  EGD yesterday with finding of an 18 mm  clean-based duodenal ulcer.  CLOtest/biopsy pending to rule out H. Pylori.  Ulcer likely NSAID induced as had been on meloxicam long-term for scrotal pain   Patient has been hemodynamically stable, no further active bleeding.  #2 history of chronic respiratory failure 3.  Chronic atrial fibrillation 4.  Chronic kidney disease 5.  Severe orthostatic hypotension 6.  Status post right BKA  Plan; continue Protonix infusion through today, then convert to oral Protonix twice daily 40 mg x 6 to 8 weeks then daily thereafter long-term  stop meloxicam permanently, discussed again with patient's wife today regarding avoiding all anti-inflammatories. He has had a lot of pain issues and does have hydrocodone to use at home.  Await CLOtest, positive will treat. Hopefully can be discharged in the next 24 to 48 hours.   LOS: 2 days   Amy EsterwoodPA-C  01/09/2020, 10:12 AM  I have also seen and evaluated the patient with history and physical.  Additional comments as follows  I would not rule out reinstituting meloxicam down the road if it helped his quality of life. He would need to heal this ulcer or have enough time on PPI do suspect it will been healed and then stay on PPI and possibly add it back. He has some sort of chronic scrotal  pain that meloxicam was helping. Instructed wife and patient they could ask PCP or urology for alternative treatment.  Hydrocodone has somehow not help this scrotal pain.  Gatha Mayer, MD, [place] [phone]

## 2020-01-09 NOTE — Evaluation (Signed)
Physical Therapy Evaluation Patient Details Name: [name] MRN: [id number] DOB: [birth year] Today's Date: 01/09/2020   History of Present Illness  [name] is a 86 y.o. male with medical history significant of seizures; dementia; hypothyroidism; HTN; HLD; CAD with stent; PVD s/p R BKA; and afib not on AC (severe spontaneous L leg hemorrhage) presenting with melena presented with chest pain. He developed onset of substernal CP.  It came on after dinner the last 2 nights. He took Rolaids the first night and it seemed to help and then went to sleep with Norco. No nausea or vomiting.  He has had dark stools for the last several days. he has been taking more Rolaids regularly over the last few weeks.  Some SOB with ambulation.  No h/o GI bleeding.  Pt found to have GIB.    Clinical Impression  Pt admitted with above diagnosis. Pt was able to scoot via sliding board to the recliner with min to min guard assist and cues for sequencing which is what wife states pt needs practice on at home.  Wife would really like for pt to be able to get into shower at home instead of bathing in bed and at sink.  Pts wife is always with pt.  Feel that a Rehab stay would benefit pt and wife is asking if it is an option.  Told wife that we can consult and see what Rehab says.  If pt is not approved she does not want SNF but will just take pt home with max HH assist.   Pt currently with functional limitations due to the deficits listed below (see PT Problem List). Pt will benefit from skilled PT to increase their independence and safety with mobility to allow discharge to the venue listed below.      Follow Up Recommendations CIR;Supervision/Assistance - 24 hour (If CIR cant take pt, HHPT,HHOT, HHaide)    Equipment Recommendations  Rolling walker with 5" wheels    Recommendations for Other Services Rehab consult     Precautions / Restrictions Precautions Precautions: Fall Required Braces or Orthoses: Other  Brace (has prosthesis but hasnt been wearing it due to wounds) Restrictions Weight Bearing Restrictions: No      Mobility  Bed Mobility Overal bed mobility: Needs Assistance Bed Mobility: Supine to Sit     Supine to sit: Supervision     General bed mobility comments: with incr time and cues came to EOB on his own.     Transfers Overall transfer level: Needs assistance Equipment used: Sliding board Transfers: Lateral/Scoot Transfers          Lateral/Scoot Transfers: Supervision;Min guard General transfer comment: Pt able to scoot over on sliding boad to drop arm reclinert to his right with cues and a little assist moving pad along as pt was bare bottomed.  Needs cues to sequence transfers all the time at home per wife as he forgets steps per wife.   Ambulation/Gait             General Gait Details: pt uses wheelchair currently at home.   Stairs            Wheelchair Mobility    Modified Rankin (Stroke Patients Only)       Balance Overall balance assessment: Needs assistance Sitting-balance support: No upper extremity supported;Feet supported Sitting balance-Leahy Scale: Fair  Pertinent Vitals/Pain Pain Assessment: No/denies pain    Home Living Family/patient expects to be discharged to:: Private residence Living Arrangements: Spouse/significant other Available Help at Discharge: Family;Available 24 hours/day Type of Home: House Home Access: Ramped entrance     Home Layout: One level Home Equipment: Walker - 4 wheels;Cane - single point;Bedside commode;Shower seat;Wheelchair - manual;Grab bars - tub/shower;Transport chair;Hospital bed (sliding board) Additional Comments: Was using right prosthesis but got a wound so went back to sliding board transfers.      Prior Function Level of Independence: Needs assistance   Gait / Transfers Assistance Needed: Sliding board transfers Modif I to min  guard and cues - wife said he forgets the steps.    ADL's / Homemaking Assistance Needed: Uses Depends and changes pt, Sponge bathes normarlly - chest up at sink and showers head.  Legs and pericare in bed.  Can comb hair and feeds self, shaves himself        Hand Dominance   Dominant Hand: Right    Extremity/Trunk Assessment   Upper Extremity Assessment Upper Extremity Assessment: Defer to OT evaluation    Lower Extremity Assessment Lower Extremity Assessment: Generalized weakness    Cervical / Trunk Assessment Cervical / Trunk Assessment: Normal  Communication   Communication: No difficulties  Cognition Arousal/Alertness: Awake/alert Behavior During Therapy: WFL for tasks assessed/performed Overall Cognitive Status: Within Functional Limits for tasks assessed                                        General Comments General comments (skin integrity, edema, etc.): VSS with activity    Exercises     Assessment/Plan    PT Assessment Patient needs continued PT services  PT Problem List Decreased activity tolerance;Decreased balance;Decreased mobility;Decreased knowledge of use of DME;Decreased safety awareness;Decreased knowledge of precautions;Decreased skin integrity       PT Treatment Interventions DME instruction;Gait training;Functional mobility training;Therapeutic activities;Therapeutic exercise;Balance training;Patient/family education    PT Goals (Current goals can be found in the Care Plan section)  Acute Rehab PT Goals Patient Stated Goal: to get rehab and be strongerand go home PT Goal Formulation: With patient Time For Goal Achievement: 01/23/20 Potential to Achieve Goals: Good    Frequency Min 3X/week   Barriers to discharge        Co-evaluation               AM-PAC PT "6 Clicks" Mobility  Outcome Measure Help needed turning from your back to your side while in a flat bed without using bedrails?: None Help needed moving  from lying on your back to sitting on the side of a flat bed without using bedrails?: None Help needed moving to and from a bed to a chair (including a wheelchair)?: A Little Help needed standing up from a chair using your arms (e.g., wheelchair or bedside chair)?: A Lot Help needed to walk in hospital room?: Total Help needed climbing 3-5 steps with a railing? : Total 6 Click Score: 15    End of Session Equipment Utilized During Treatment: Gait belt Activity Tolerance: Patient limited by fatigue Patient left: in chair;with call bell/phone within reach;with chair alarm set;with family/visitor present Nurse Communication: Mobility status PT Visit Diagnosis: Muscle weakness (generalized) (M62.81)    Time: 1130-1200 PT Time Calculation (min) (ACUTE ONLY): 30 min   Charges:   PT Evaluation $PT Eval Moderate Complexity: 1 Mod PT Treatments $  Therapeutic Activity: 8-22 mins        Mikinzie Maciejewski W,PT Acute Rehabilitation Services Pager:  [phone]  Office:  [place] 01/09/2020, 1:29 PM

## 2020-01-09 NOTE — Evaluation (Signed)
Occupational Therapy Evaluation Patient Details Name: [name] MRN: [id number] DOB: 1934-01-29 Today's Date: 01/09/2020    History of Present Illness [name] is a 86 y.o. male with medical history significant of seizures; dementia; hypothyroidism; HTN; HLD; CAD with stent; PVD s/p R BKA; and afib not on AC (severe spontaneous L leg hemorrhage) presenting with melena presented with chest pain. He developed onset of substernal CP.  It came on after dinner the last 2 nights. He took Rolaids the first night and it seemed to help and then went to sleep with Norco. No nausea or vomiting.  He has had dark stools for the last several days. he has been taking more Rolaids regularly over the last few weeks.  Some SOB with ambulation.  No h/o GI bleeding.  Pt found to have GIB.     Clinical Impression   Pt admitted with the above diagnoses and presents with below problem list. Pt will benefit from continued acute OT to address the below listed deficits and maximize independence with basic ADLs prior to d/c home with spouse. At baseline, pt was utilizing sliding board for transfers with sequencing cues sometimes needed, complete UB bathing dressing seated at mod I level, LB ADLs and pericare at bed level with some assistance, wears Depends. Pt presents with decreased activity tolerance, generalized weakness and impaired balance impacting ADLs. Spouse present throughout session and inquiring about inpatient rehab program prior to home. She is his sole caregiver.     Follow Up Recommendations  CIR;Home health OT;Supervision/Assistance - 24 hour    Equipment Recommendations  None recommended by OT    Recommendations for Other Services       Precautions / Restrictions Precautions Precautions: Fall Required Braces or Orthoses: Other Brace Other Brace: has prosthesis but hasn't been wearing it due to wounds Restrictions Weight Bearing Restrictions: No      Mobility Bed Mobility Overal bed  mobility: Needs Assistance Bed Mobility: Rolling Rolling: Min assist         General bed mobility comments: assist to roll completely onto each side for pericare. able to come to long sitting position from supine.    Transfers Overall transfer level: Needs assistance Equipment used: Sliding board Transfers: Lateral/Scoot Transfers          Lateral/Scoot Transfers: Supervision;Min guard General transfer comment: During PT session pt completed lateral scoot transfer at min guard level utilizing sliding board.     Balance Overall balance assessment: Needs assistance Sitting-balance support: No upper extremity supported;Feet supported Sitting balance-Leahy Scale: Fair Sitting balance - Comments: able to long sit with only intermittent single extremity support                                   ADL either performed or assessed with clinical judgement   ADL Overall ADL's : Needs assistance/impaired Eating/Feeding: Set up;Sitting   Grooming: Minimal assistance;Sitting   Upper Body Bathing: Minimal assistance;Sitting   Lower Body Bathing: Minimal assistance;Sitting/lateral leans   Upper Body Dressing : Minimal assistance;Sitting   Lower Body Dressing: Minimal assistance;Sitting/lateral leans   Toilet Transfer: Physicist, medical;Requires drop arm;BSC;Comfort height toilet   Toileting- Clothing Manipulation and Hygiene: Bed level;Maximal assistance               Vision         Perception     Praxis      Pertinent Vitals/Pain Pain Assessment: No/denies  pain     Hand Dominance Right   Extremity/Trunk Assessment Upper Extremity Assessment Upper Extremity Assessment: Generalized weakness   Lower Extremity Assessment Lower Extremity Assessment: Defer to PT evaluation       Communication Communication Communication: No difficulties   Cognition Arousal/Alertness: Awake/alert Behavior During Therapy: Flat affect;WFL for tasks  assessed/performed Overall Cognitive Status: History of cognitive impairments - at baseline                                     General Comments       Exercises     Shoulder Instructions      Home Living Family/patient expects to be discharged to:: Private residence Living Arrangements: Spouse/significant other Available Help at Discharge: Family;Available 24 hours/day Type of Home: House Home Access: Ramped entrance     Home Layout: One level     Bathroom Shower/Tub: Teacher, early years/pre: Handicapped height Bathroom Accessibility: Yes   Home Equipment: Environmental consultant - 4 wheels;Cane - single point;Bedside commode;Shower seat;Wheelchair - manual;Grab bars - tub/shower;Transport chair;Hospital bed (sliding board)   Additional Comments: Was using right prosthesis but got a wound so went back to sliding board transfers.        Prior Functioning/Environment Level of Independence: Needs assistance  Gait / Transfers Assistance Needed: Sliding board transfers Modif I to min guard and cues - wife said he forgets the steps.   ADL's / Homemaking Assistance Needed: Uses Depends and changes pt, Sponge bathes normarlly - chest up at sink and showers head.  Legs and pericare in bed.  Can comb hair and feeds self, shaves himself            OT Problem List: Decreased strength;Decreased activity tolerance;Impaired balance (sitting and/or standing);Decreased cognition;Decreased knowledge of use of DME or AE;Decreased knowledge of precautions;Pain      OT Treatment/Interventions: Self-care/ADL training;Therapeutic exercise;DME and/or AE instruction;Energy conservation;Therapeutic activities;Patient/family education;Balance training    OT Goals(Current goals can be found in the care plan section) Acute Rehab OT Goals Patient Stated Goal: to get rehab and be stronger and go home OT Goal Formulation: With patient/family Time For Goal Achievement: 01/23/20 Potential  to Achieve Goals: Good ADL Goals Pt Will Perform Upper Body Dressing: with set-up;sitting Pt Will Perform Lower Body Dressing: with min assist;sit to/from stand;sitting/lateral leans Pt Will Transfer to Toilet: with supervision;with transfer board Pt Will Perform Toileting - Clothing Manipulation and hygiene: with min assist;sitting/lateral leans Pt/caregiver will Perform Home Exercise Program: Both right and left upper extremity;With written HEP provided;With Supervision;With theraband  OT Frequency: Min 2X/week   Barriers to D/C:            Co-evaluation              AM-PAC OT "6 Clicks" Daily Activity     Outcome Measure Help from another person eating meals?: None Help from another person taking care of personal grooming?: A Little Help from another person toileting, which includes using toliet, bedpan, or urinal?: A Lot Help from another person bathing (including washing, rinsing, drying)?: A Lot Help from another person to put on and taking off regular upper body clothing?: A Little Help from another person to put on and taking off regular lower body clothing?: A Lot 6 Click Score: 16   End of Session    Activity Tolerance: Patient tolerated treatment well Patient left: in bed;with call bell/phone within reach;with family/visitor present  OT Visit Diagnosis: Other abnormalities of gait and mobility (R26.89);Muscle weakness (generalized) (M62.81);Other symptoms and signs involving cognitive function;Pain                Time: 1451-1513 OT Time Calculation (min): 22 min Charges:  OT General Charges $OT Visit: 1 Visit OT Evaluation $OT Eval Low Complexity: [place], OT Acute Rehabilitation Services Pager: [phone] Office: [phone]   Hortencia Pilar 01/09/2020, 4:19 PM

## 2020-01-09 NOTE — Progress Notes (Signed)
PROGRESS NOTE    [name]  AJO:[id number] DOB: August 18, 1933 DOA: 01/07/2020 PCP: [name], MD   Brief Narrative:  [name] is a 86 y.o. male with medical history significant of seizures; dementia; hypothyroidism; HTN; HLD; CAD with stent; PVD s/p R BKA; and afib not on AC (severe spontaneous L leg hemorrhage) presenting with melena presented with chest pain. He developed onset of substernal CP.  It came on after dinner the last 2 nights. He took Rolaids the first night and it seemed to help and then went to sleep with Norco. No nausea or vomiting.  He has had dark stools for the last several days. he has been taking more Rolaids regularly over the last few weeks.  Some SOB with ambulation.  No h/o GI bleeding.  He takes ASA 81 mg daily but no other NSAIDs other than rub or patch according to patient and wife, but he is taking daily Mobic based on his Med Rec.  He is not ambulatory, uses a sliding board for transfers.  Stopped PT about 6 weeks ago, noticing ulcers on his stump that are improving.  Last saw the PA on 10/19 for this issue. He is extremely slow to heal and bruises easily.  Upon arrival to ED, he was hemodynamically stable.  He was then found to have hemoglobin of 8.6, drop from 11 just 2 and half months ago.  FOBT was positive.  He was admitted under hospital service and GI was consulted.  Assessment & Plan:   Principal Problem:   Gastrointestinal hemorrhage Active Problems:   Essential hypertension   Hypothyroidism   Seizure disorder (HCC)   Acute kidney injury ([place])   Chronic pain   Stage 3a chronic kidney disease   Chronic diastolic (congestive) heart failure (HCC)   Elevated troponin   Chronic atrial fibrillation   S/P BKA (below knee amputation) unilateral, right (HCC)   Atypical chest pain   Chronic duodenal ulcer with hemorrhage   Upper GI bleeding/acute blood loss anemia/nonbleeding duodenal ulcer: GI on board.  Underwent EGD on 01/08/2020  with Dr. Carlean Purl and was found to have nonbleeding duodenal ulcer.  Hemoglobin remained stable over 8.  Has not required any blood transfusion.  GI plans to continue IV Protonix all day today and likely switch to oral Protonix either later today or tomorrow and potential discharge tomorrow.   Chest pain: Likely etiology of this is duodenal ulcer.  No more complaints.  Chronic thrombocytopenia: Stable.  Watch daily.  AKI on stage 3a CKD: Creatinine stable around his baseline.  HTN: Fairly controlled. -Continue Cardizem, Cardura, Lopressor  Hypothyroidism TSH normal. -Continue Synthroid at current dose for now  HLD -Continue Pravachol  Seizure d/o -Continue Depakote  Afib -Rate controlled with diltiazem, Lopressor -No longer a candidate for AC due to severe L leg hemorrhage  Chronic diastolic CHF -Hold Lasix due to apparent mild volume deficiency, as above.  He appears slightly dehydrated.  S/p R BKA -Superficial ulcers noted at base of stump without apparent infection -Will request wound care consultation  Prolonged QTc -Hold QT prolonging medications including Macrodantin (used for UTI prophylaxis)  DVT prophylaxis: SCDs Start: 01/07/20 1031   Code Status: DNR  Family Communication:  None present at bedside.  Plan of care discussed with patient in length and he verbalized understanding and agreed with it.  Status is: Inpatient  Remains inpatient appropriate because:IV treatments appropriate due to intensity of illness or inability to take PO   Dispo: The patient  is from: Home              Anticipated d/c is to: Home              Anticipated d/c date is: 1 day              Patient currently is not medically stable to d/c.  Will be discharged when cleared by GI     Estimated body mass index is 22.89 kg/m as calculated from the following:   Height as of this encounter: 5\' 9"  (1.753 m).   Weight as of 12/29/19: 70.3 kg.      Nutritional status:                Consultants:   GI  Procedures:   EGD 01/08/2020  Antimicrobials:  Anti-infectives (From admission, onward)   Start     Dose/Rate Route Frequency Ordered Stop   01/07/20 1045  nitrofurantoin (MACRODANTIN) capsule 100 mg  Status:  Discontinued        100 mg Oral Daily 01/07/20 1032 01/07/20 1120         Subjective: Seen and examined.  Wife at the bedside.  Patient denies having any complaint.  Wife anticipating discharge home today but GI plans to keep him overnight.  Objective: Vitals:   01/09/20 0030 01/09/20 0300 01/09/20 0322 01/09/20 0812  BP: (!) 141/81 (!) 143/75  137/89  Pulse: 85 72  82  Resp: 19 (!) 21 20 11   Temp: 98.4 F (36.9 C) 98.1 F (36.7 C)  97.9 F (36.6 C)  TempSrc: Oral Oral Oral Oral  SpO2: 99% 94%  96%  Height:        Intake/Output Summary (Last 24 hours) at 01/09/2020 1223 Last data filed at 01/09/2020 0030 Gross per 24 hour  Intake 556.45 ml  Output 750 ml  Net -193.55 ml   There were no vitals filed for this visit.  Examination:  General exam: Appears calm and comfortable  Respiratory system: Clear to auscultation. Respiratory effort normal. Cardiovascular system: S1 & S2 heard, RRR. No JVD, murmurs, rubs, gallops or clicks. Gastrointestinal system: Abdomen is nondistended, soft and nontender. No organomegaly or masses felt. Normal bowel sounds heard. Central nervous system: Alert and oriented. No focal neurological deficits. Extremities: Right BKA Skin: No rashes, lesions or ulcers.   Data Reviewed: I have personally reviewed following labs and imaging studies  CBC: Recent Labs  Lab 01/07/20 0507 01/07/20 1643 01/08/20 0553 01/08/20 1810 01/09/20 0240  WBC 10.7* 9.0 7.9 8.8 7.2  HGB 8.6* 8.4* 8.2* 9.0* 8.5*  HCT 29.1* 27.5* 26.6* 28.9* 26.9*  MCV 110.6* 105.0* 102.7* 104.0* 104.3*  PLT 127* 123* 134* 139* 956*   Basic Metabolic Panel: Recent Labs  Lab 01/07/20 0507 01/07/20 0641 01/08/20 0553  01/09/20 0240  NA 141  --  141 139  K 4.4  --  4.2 4.3  CL 106  --  108 106  CO2 23  --  23 23  GLUCOSE 92  --  80 85  BUN 49*  --  37* 35*  CREATININE 2.03*  --  1.60* 1.66*  CALCIUM 9.0  --  8.3* 8.1*  MG  --  2.1  --   --    GFR: Estimated Creatinine Clearance: 32.9 mL/min (A) (by C-G formula based on SCr of 1.66 mg/dL (H)). Liver Function Tests: Recent Labs  Lab 01/07/20 0507  AST 19  ALT 11  ALKPHOS 52  BILITOT 0.9  PROT 5.2*  ALBUMIN 3.2*   No results for input(s): LIPASE, AMYLASE in the last 168 hours. No results for input(s): AMMONIA in the last 168 hours. Coagulation Profile: Recent Labs  Lab 01/07/20 0828  INR 1.2   Cardiac Enzymes: No results for input(s): CKTOTAL, CKMB, CKMBINDEX, TROPONINI in the last 168 hours. BNP (last 3 results) No results for input(s): PROBNP in the last 8760 hours. HbA1C: No results for input(s): HGBA1C in the last 72 hours. CBG: Recent Labs  Lab 01/07/20 0511  GLUCAP 89   Lipid Profile: No results for input(s): CHOL, HDL, LDLCALC, TRIG, CHOLHDL, LDLDIRECT in the last 72 hours. Thyroid Function Tests: Recent Labs    01/08/20 0553  TSH 2.787   Anemia Panel: No results for input(s): VITAMINB12, FOLATE, FERRITIN, TIBC, IRON, RETICCTPCT in the last 72 hours. Sepsis Labs: No results for input(s): PROCALCITON, LATICACIDVEN in the last 168 hours.  Recent Results (from the past 240 hour(s))  Respiratory Panel by RT PCR (Flu A&B, Covid) - Nasopharyngeal Swab     Status: None   Collection Time: 01/07/20  5:52 AM   Specimen: Nasopharyngeal Swab  Result Value Ref Range Status   SARS Coronavirus 2 by RT PCR NEGATIVE NEGATIVE Final    Comment: (NOTE) SARS-CoV-2 target nucleic acids are NOT DETECTED.  The SARS-CoV-2 RNA is generally detectable in upper respiratoy specimens during the acute phase of infection. The lowest concentration of SARS-CoV-2 viral copies this assay can detect is 131 copies/mL. A negative result does not  preclude SARS-Cov-2 infection and should not be used as the sole basis for treatment or other patient management decisions. A negative result may occur with  improper specimen collection/handling, submission of specimen other than nasopharyngeal swab, presence of viral mutation(s) within the areas targeted by this assay, and inadequate number of viral copies (<131 copies/mL). A negative result must be combined with clinical observations, patient history, and epidemiological information. The expected result is Negative.  Fact Sheet for Patients:  PinkCheek.be  Fact Sheet for Healthcare Providers:  GravelBags.it  This test is no t yet approved or cleared by the [place] FDA and  has been authorized for detection and/or diagnosis of SARS-CoV-2 by FDA under an Emergency Use Authorization (EUA). This EUA will remain  in effect (meaning this test can be used) for the duration of the COVID-19 declaration under Section 564(b)(1) of the Act, 21 U.S.C. section 360bbb-3(b)(1), unless the authorization is terminated or revoked sooner.     Influenza A by PCR NEGATIVE NEGATIVE Final   Influenza B by PCR NEGATIVE NEGATIVE Final    Comment: (NOTE) The Xpert Xpress SARS-CoV-2/FLU/RSV assay is intended as an aid in  the diagnosis of influenza from Nasopharyngeal swab specimens and  should not be used as a sole basis for treatment. Nasal washings and  aspirates are unacceptable for Xpert Xpress SARS-CoV-2/FLU/RSV  testing.  Fact Sheet for Patients: PinkCheek.be  Fact Sheet for Healthcare Providers: GravelBags.it  This test is not yet approved or cleared by the [place] FDA and  has been authorized for detection and/or diagnosis of SARS-CoV-2 by  FDA under an Emergency Use Authorization (EUA). This EUA will remain  in effect (meaning this test can be used) for the  duration of the  Covid-19 declaration under Section 564(b)(1) of the Act, 21  U.S.C. section 360bbb-3(b)(1), unless the authorization is  terminated or revoked. Performed at [facility], [place] [address] [place], [place] [address]       Radiology Studies: No results found.  Scheduled Meds: . diltiazem  240 mg Oral Daily  . divalproex  250 mg Oral BID  . doxazosin  4 mg Oral QHS  . levothyroxine  150 mcg Oral QAC breakfast  . metoprolol tartrate  25 mg Oral BID  . pravastatin  40 mg Oral QPM  . [START ON 01/10/2020] predniSONE  8 mg Oral Daily  . sodium chloride flush  3 mL Intravenous Q12H   Continuous Infusions: . lactated ringers 0 mL (01/08/20 0935)  . pantoprozole (PROTONIX) infusion 8 mg/hr (01/09/20 0025)     LOS: 2 days   Time spent: 30 minutes   Darliss Cheney, MD [facility]  01/09/2020, 12:23 PM   To contact the attending provider between 7A-7P or the covering provider during after hours 7P-7A, please log into the web site [email or web address].

## 2020-01-09 NOTE — Progress Notes (Signed)
Mobility Specialist: Progress Note   01/09/20 1401  Mobility  Activity Transferred:  Chair to bed  Level of Assistance Maximum assist, patient does 25-49%  Assistive Device Sliding board  Mobility Response Tolerated well  Mobility performed by Mobility specialist  Bed Position Semi-fowlers  $Mobility charge 1 Mobility   Pre-Mobility: 90 HR, 135/75 BP Post-Mobility: 89 HR, 135/66 BP, 98% SpO2  Pt tolerated transfer well. Pt had no c/o during transfer. Pt's wife assisted with keep chuck pad under pt. Pt was able to maintain balance with trunk independently during transfer.   [facility] Jenice Leiner Mobility Specialist

## 2020-01-10 ENCOUNTER — Encounter (HOSPITAL_COMMUNITY): Payer: Self-pay | Admitting: Internal Medicine

## 2020-01-10 DIAGNOSIS — K264 Chronic or unspecified duodenal ulcer with hemorrhage: Secondary | ICD-10-CM | POA: Diagnosis not present

## 2020-01-10 DIAGNOSIS — D62 Acute posthemorrhagic anemia: Secondary | ICD-10-CM | POA: Diagnosis not present

## 2020-01-10 LAB — CBC WITH DIFFERENTIAL/PLATELET
Abs Immature Granulocytes: 0.09 10*3/uL — ABNORMAL HIGH (ref 0.00–0.07)
Basophils Absolute: 0 10*3/uL (ref 0.0–0.1)
Basophils Relative: 0 %
Eosinophils Absolute: 0 10*3/uL (ref 0.0–0.5)
Eosinophils Relative: 1 %
HCT: 23.7 % — ABNORMAL LOW (ref 39.0–52.0)
Hemoglobin: 7.3 g/dL — ABNORMAL LOW (ref 13.0–17.0)
Immature Granulocytes: 1 %
Lymphocytes Relative: 8 %
Lymphs Abs: 0.6 10*3/uL — ABNORMAL LOW (ref 0.7–4.0)
MCH: 31.3 pg (ref 26.0–34.0)
MCHC: 30.8 g/dL (ref 30.0–36.0)
MCV: 101.7 fL — ABNORMAL HIGH (ref 80.0–100.0)
Monocytes Absolute: 0.8 10*3/uL (ref 0.1–1.0)
Monocytes Relative: 10 %
Neutro Abs: 6.1 10*3/uL (ref 1.7–7.7)
Neutrophils Relative %: 80 %
Platelets: 135 10*3/uL — ABNORMAL LOW (ref 150–400)
RBC: 2.33 MIL/uL — ABNORMAL LOW (ref 4.22–5.81)
RDW: 14.4 % (ref 11.5–15.5)
WBC: 7.6 10*3/uL (ref 4.0–10.5)
nRBC: 0 % (ref 0.0–0.2)

## 2020-01-10 LAB — BASIC METABOLIC PANEL
Anion gap: 8 (ref 5–15)
BUN: 47 mg/dL — ABNORMAL HIGH (ref 8–23)
CO2: 23 mmol/L (ref 22–32)
Calcium: 7.9 mg/dL — ABNORMAL LOW (ref 8.9–10.3)
Chloride: 108 mmol/L (ref 98–111)
Creatinine, Ser: 1.58 mg/dL — ABNORMAL HIGH (ref 0.61–1.24)
GFR, Estimated: 43 mL/min — ABNORMAL LOW (ref >60)
Glucose, Bld: 98 mg/dL (ref 70–99)
Potassium: 4.5 mmol/L (ref 3.5–5.1)
Sodium: 139 mmol/L (ref 135–145)

## 2020-01-10 LAB — PREPARE RBC (CROSSMATCH)

## 2020-01-10 MED ORDER — SODIUM CHLORIDE 0.9% IV SOLUTION: Freq: Once | INTRAVENOUS | Status: AC

## 2020-01-10 MED ORDER — PANTOPRAZOLE SODIUM 40 MG PO TBEC
40.0000 mg | DELAYED_RELEASE_TABLET | Freq: Two times a day (BID) | ORAL | Status: DC
  Administered 2020-01-10 – 2020-01-11 (×3): 40 mg via ORAL
  Filled 2020-01-10 (×3): qty 1

## 2020-01-10 NOTE — Progress Notes (Signed)
   Patient Name: [name] Date of Encounter: 01/10/2020, 4:45 PM    Subjective  Following for duodenal ulcer with hemorrhage, related to meloxicam CLOtest returned negative Wife concerned about aspiration risk asking to have speech path see him again No overt signs of bleeding reported Will be evaluated for inpatient rehab stay   Objective  BP 109/67 (BP Location: Left Arm)   Pulse 71   Temp (!) 97.5 F (36.4 C) (Oral)   Resp 18   Ht 5\' 9"  (1.753 m)   SpO2 98%   BMI 22.89 kg/m  Chronically ill lying in the bed but in no acute distress  CBC Latest Ref Rng & Units 01/10/2020 01/09/2020 01/08/2020  WBC 4.0 - 10.5 K/uL 7.6 7.2 8.8  Hemoglobin 13.0 - 17.0 g/dL 7.3(L) 8.5(L) 9.0(L)  Hematocrit 39 - 52 % 23.7(L) 26.9(L) 28.9(L)  Platelets 150 - 400 K/uL 135(L) 136(L) 139(L)       Assessment and Plan  1) chronic duodenal ulcer with hemorrhage ulcer secondary to meloxicam CLOtest negative 2) acute blood loss anemia with hemoglobin down to 7.3 today 3) chest pain positive troponins demand ischemia suspected at admission 4) chronic scrotal pain for which he was taking the meloxicam 5) history of aspiration pneumonia multiple times in 2019, wife concerned that he is not on adequate aspiration precautions when getting medication and food here and that he himself has lost compliance with some of the techniques to prevent aspiration   I think his hemoglobin is such a point that a transfusion is appropriate so we will transfuse 2 unit of packed red cells.  He had demand ischemia he is going to try to do rehab either inpatient or outpatient and this will improve that.  I will consult speech path to reassess him and I have ordered aspiration precautions   Continue twice daily Protonix  When he is ready for discharge either from hospital to home or rehab unit to home outpatient GI follow-up may be scheduled-remain off meloxicam and other NSAIDs 81 mg aspirin is acceptable  even now  GI is signing off at this point but will be available to come back should the need arise Gatha Mayer, MD, [facility] 01/10/2020 4:45 PM

## 2020-01-10 NOTE — TOC Progression Note (Addendum)
Transition of Care [facility]) - Progression Note    Patient Details  Name: [name] MRN: [id number] Date of Birth: 29-Dec-1933  Transition of [facility]) CM/SW [place], [place] Phone Number: [phone] 01/10/2020, 12:50 PM  Clinical Narrative:     CSW attempted to follow up with CIR in regards to admission consultation. CSW was informed to call back within a hour..  TOC team will continue to assist with discharge planning needs.      Expected Discharge Plan and Services                                                 Social Determinants of Health (SDOH) Interventions    Readmission Risk Interventions No flowsheet data found.

## 2020-01-10 NOTE — Progress Notes (Signed)
Mobility Specialist: Progress Note   01/10/20 1151  Mobility  Activity Transferred:  Bed to chair;Transferred:  Chair to bed  Level of Assistance Moderate assist, patient does 50-74%  Assistive Device Sliding board  Mobility Response Tolerated fair  Mobility performed by Mobility specialist  Bed Position Semi-fowlers  $Mobility charge 1 Mobility   Pre-Mobility: 76 HR, 123/79 BP, 98% SpO2 Post-Mobility: 81 HR, 109/66 BP, 96% SpO2  Pt transferred utilizing sliding board to the chair. Pt needed to be cleaned up with assistance from NT so pt was transferred back to the bed. Pt was min to mod assist during transfer from bed to chair. Pt was max assist when transferring back to bed. Pt has been cleaned up. RN notified that pt is back in the bed.   [facility] Dagoberto Nealy Mobility Specialist

## 2020-01-10 NOTE — Progress Notes (Addendum)
PROGRESS NOTE    [name]  YQI:[id number] DOB: [birth year] DOA: 01/07/2020 PCP: [name], MD   Brief Narrative:  [name] is a 86 y.o. male with medical history significant of seizures; dementia; hypothyroidism; HTN; HLD; CAD with stent; PVD s/p R BKA; and afib not on AC (severe spontaneous L leg hemorrhage) presenting with melena presented with chest pain. He developed onset of substernal CP.  It came on after dinner the last 2 nights. He took Rolaids the first night and it seemed to help and then went to sleep with Norco. No nausea or vomiting.  He has had dark stools for the last several days. he has been taking more Rolaids regularly over the last few weeks.  Some SOB with ambulation.  No h/o GI bleeding.  He takes ASA 81 mg daily but no other NSAIDs other than rub or patch according to patient and wife, but he is taking daily Mobic based on his Med Rec.  He is not ambulatory, uses a sliding board for transfers.  Stopped PT about 6 weeks ago, noticing ulcers on his stump that are improving.  Last saw the PA on 10/19 for this issue. He is extremely slow to heal and bruises easily.  Upon arrival to ED, he was hemodynamically stable.  He was then found to have hemoglobin of 8.6, drop from 11 just 2 and half months ago.  FOBT was positive.  He was admitted under hospital service and GI was consulted. Underwent EGD on 01/08/2020 with Dr. Carlean Purl and was found to have nonbleeding duodenal ulcer  Assessment & Plan:   Principal Problem:   Gastrointestinal hemorrhage Active Problems:   Essential hypertension   Hypothyroidism   Seizure disorder ([place])   Acute kidney injury ([place])   Chronic pain   Stage 3a chronic kidney disease   Chronic diastolic (congestive) heart failure (HCC)   Elevated troponin   Chronic atrial fibrillation   S/P BKA (below knee amputation) unilateral, right (HCC)   Atypical chest pain   Chronic duodenal ulcer with hemorrhage   Acute blood loss  anemia   Upper GI bleeding/acute blood loss anemia/nonbleeding duodenal ulcer: GI on board.  Underwent EGD on 01/08/2020 with Dr. Carlean Purl and was found to have nonbleeding duodenal ulcer. Has not required any blood transfusion.  Hemoglobin dropped to 7.3 from 8.5 yesterday.  He denies any further black stool.  This could be reflection of bleeding in last 1 to 2 days.  Will repeat CBC tomorrow morning.  We will transition from IV Protonix to oral Protonix 40 mg p.o. twice daily as GI had recommended in their note yesterday.  He was seen by PT OT and they are recommending CIR.  Patient and his wife are agreeable to that.  Hopefully his insurance authorization will be initiated tomorrow as today is a weekend.  Chest pain: Likely etiology of this is duodenal ulcer.  No more complaints.  Chronic thrombocytopenia: Stable.  Watch daily.  AKI on stage 3a CKD: Creatinine stable around his baseline.  HTN: Fairly controlled. -Continue Cardizem, Cardura, Lopressor  Hypothyroidism TSH normal. -Continue Synthroid at current dose for now  HLD -Continue Pravachol  Seizure d/o -Continue Depakote  Afib -Rate controlled with diltiazem, Lopressor -No longer a candidate for AC due to severe L leg hemorrhage  Chronic diastolic CHF -Hold Lasix due to apparent mild volume deficiency, as above.  He appears slightly dehydrated.  S/p R BKA -Superficial ulcers noted at base of stump without apparent infection -Will  request wound care consultation  Prolonged QTc -Hold QT prolonging medications including Macrodantin (used for UTI prophylaxis)  DVT prophylaxis: SCDs Start: 01/07/20 1031   Code Status: DNR  Family Communication:  None present at bedside.  Plan of care discussed with patient in length and he verbalized understanding and agreed with it.  Status is: Inpatient  Remains inpatient appropriate because:IV treatments appropriate due to intensity of illness or inability to take  PO   Dispo: The patient is from: Home              Anticipated d/c is to: Home              Anticipated d/c date is: 1 day              Patient currently is not medically stable to d/c.  Will be discharged when cleared by GI and needs at least 1 more night for hemoglobin monitoring.     Estimated body mass index is 22.89 kg/m as calculated from the following:   Height as of this encounter: 5\' 9"  (1.753 m).   Weight as of 12/29/19: 70.3 kg.      Nutritional status:               Consultants:   GI  Procedures:   EGD 01/08/2020  Antimicrobials:  Anti-infectives (From admission, onward)   Start     Dose/Rate Route Frequency Ordered Stop   01/07/20 1045  nitrofurantoin (MACRODANTIN) capsule 100 mg  Status:  Discontinued        100 mg Oral Daily 01/07/20 1032 01/07/20 1120         Subjective: Patient seen and examined.  Wife at the bedside.  He looks brighter.  He denied any complaint.  Objective: Vitals:   01/09/20 2335 01/10/20 0534 01/10/20 0545 01/10/20 0837  BP: 111/62  132/74 134/79  Pulse: 68  78 88  Resp: 17 19 19 19   Temp: 97.7 F (36.5 C)  98.2 F (36.8 C) 98.3 F (36.8 C)  TempSrc: Oral Oral Oral Oral  SpO2: 100%  99% 98%  Height:       No intake or output data in the 24 hours ending 01/10/20 1131 There were no vitals filed for this visit.  Examination:  General exam: Appears calm and comfortable  Respiratory system: Clear to auscultation. Respiratory effort normal. Cardiovascular system: S1 & S2 heard, RRR. No JVD, murmurs, rubs, gallops or clicks. No pedal edema. Gastrointestinal system: Abdomen is nondistended, soft and nontender. No organomegaly or masses felt. Normal bowel sounds heard. Central nervous system: Alert and oriented. No focal neurological deficits. Extremities: Symmetric 5 x 5 power. Skin: No rashes, lesions or ulcers.  Psychiatry: Judgement and insight appear normal. Mood & affect appropriate.   Data Reviewed: I  have personally reviewed following labs and imaging studies  CBC: Recent Labs  Lab 01/07/20 1643 01/08/20 0553 01/08/20 1810 01/09/20 0240 01/10/20 0159  WBC 9.0 7.9 8.8 7.2 7.6  NEUTROABS  --   --   --   --  6.1  HGB 8.4* 8.2* 9.0* 8.5* 7.3*  HCT 27.5* 26.6* 28.9* 26.9* 23.7*  MCV 105.0* 102.7* 104.0* 104.3* 101.7*  PLT 123* 134* 139* 136* 952*   Basic Metabolic Panel: Recent Labs  Lab 01/07/20 0507 01/07/20 0641 01/08/20 0553 01/09/20 0240 01/10/20 0159  NA 141  --  141 139 139  K 4.4  --  4.2 4.3 4.5  CL 106  --  108 106 108  CO2  23  --  23 23 23   GLUCOSE 92  --  80 85 98  BUN 49*  --  37* 35* 47*  CREATININE 2.03*  --  1.60* 1.66* 1.58*  CALCIUM 9.0  --  8.3* 8.1* 7.9*  MG  --  2.1  --   --   --    GFR: Estimated Creatinine Clearance: 34.6 mL/min (A) (by C-G formula based on SCr of 1.58 mg/dL (H)). Liver Function Tests: Recent Labs  Lab 01/07/20 0507  AST 19  ALT 11  ALKPHOS 52  BILITOT 0.9  PROT 5.2*  ALBUMIN 3.2*   No results for input(s): LIPASE, AMYLASE in the last 168 hours. No results for input(s): AMMONIA in the last 168 hours. Coagulation Profile: Recent Labs  Lab 01/07/20 0828  INR 1.2   Cardiac Enzymes: No results for input(s): CKTOTAL, CKMB, CKMBINDEX, TROPONINI in the last 168 hours. BNP (last 3 results) No results for input(s): PROBNP in the last 8760 hours. HbA1C: No results for input(s): HGBA1C in the last 72 hours. CBG: Recent Labs  Lab 01/07/20 0511  GLUCAP 89   Lipid Profile: No results for input(s): CHOL, HDL, LDLCALC, TRIG, CHOLHDL, LDLDIRECT in the last 72 hours. Thyroid Function Tests: Recent Labs    01/08/20 0553  TSH 2.787   Anemia Panel: No results for input(s): VITAMINB12, FOLATE, FERRITIN, TIBC, IRON, RETICCTPCT in the last 72 hours. Sepsis Labs: No results for input(s): PROCALCITON, LATICACIDVEN in the last 168 hours.  Recent Results (from the past 240 hour(s))  Respiratory Panel by RT PCR (Flu A&B,  Covid) - Nasopharyngeal Swab     Status: None   Collection Time: 01/07/20  5:52 AM   Specimen: Nasopharyngeal Swab  Result Value Ref Range Status   SARS Coronavirus 2 by RT PCR NEGATIVE NEGATIVE Final    Comment: (NOTE) SARS-CoV-2 target nucleic acids are NOT DETECTED.  The SARS-CoV-2 RNA is generally detectable in upper respiratoy specimens during the acute phase of infection. The lowest concentration of SARS-CoV-2 viral copies this assay can detect is 131 copies/mL. A negative result does not preclude SARS-Cov-2 infection and should not be used as the sole basis for treatment or other patient management decisions. A negative result may occur with  improper specimen collection/handling, submission of specimen other than nasopharyngeal swab, presence of viral mutation(s) within the areas targeted by this assay, and inadequate number of viral copies (<131 copies/mL). A negative result must be combined with clinical observations, patient history, and epidemiological information. The expected result is Negative.  Fact Sheet for Patients:  PinkCheek.be  Fact Sheet for Healthcare Providers:  GravelBags.it  This test is no t yet approved or cleared by the [place] FDA and  has been authorized for detection and/or diagnosis of SARS-CoV-2 by FDA under an Emergency Use Authorization (EUA). This EUA will remain  in effect (meaning this test can be used) for the duration of the COVID-19 declaration under Section 564(b)(1) of the Act, 21 U.S.C. section 360bbb-3(b)(1), unless the authorization is terminated or revoked sooner.     Influenza A by PCR NEGATIVE NEGATIVE Final   Influenza B by PCR NEGATIVE NEGATIVE Final    Comment: (NOTE) The Xpert Xpress SARS-CoV-2/FLU/RSV assay is intended as an aid in  the diagnosis of influenza from Nasopharyngeal swab specimens and  should not be used as a sole basis for treatment. Nasal  washings and  aspirates are unacceptable for Xpert Xpress SARS-CoV-2/FLU/RSV  testing.  Fact Sheet for Patients: PinkCheek.be  Fact Sheet  for Healthcare Providers: GravelBags.it  This test is not yet approved or cleared by the [place] and  has been authorized for detection and/or diagnosis of SARS-CoV-2 by  FDA under an Emergency Use Authorization (EUA). This EUA will remain  in effect (meaning this test can be used) for the duration of the  Covid-19 declaration under Section 564(b)(1) of the Act, 21  U.S.C. section 360bbb-3(b)(1), unless the authorization is  terminated or revoked. Performed at [facility], [place] [address] [place], [place] [address]       Radiology Studies: No results found.  Scheduled Meds: . diltiazem  240 mg Oral Daily  . divalproex  250 mg Oral BID  . doxazosin  4 mg Oral QHS  . levothyroxine  150 mcg Oral QAC breakfast  . metoprolol tartrate  25 mg Oral BID  . pantoprazole  40 mg Oral BID  . pravastatin  40 mg Oral QPM  . predniSONE  8 mg Oral Daily  . sodium chloride flush  3 mL Intravenous Q12H   Continuous Infusions: . lactated ringers 0 mL (01/08/20 0935)     LOS: 3 days   Time spent: 29 minutes   Darliss Cheney, MD [facility]  01/10/2020, 11:31 AM   To contact the attending provider between 7A-7P or the covering provider during after hours 7P-7A, please log into the web site [email or web address].

## 2020-01-11 LAB — CBC WITH DIFFERENTIAL/PLATELET
Abs Immature Granulocytes: 0.14 10*3/uL — ABNORMAL HIGH (ref 0.00–0.07)
Basophils Absolute: 0 10*3/uL (ref 0.0–0.1)
Basophils Relative: 1 %
Eosinophils Absolute: 0.1 10*3/uL (ref 0.0–0.5)
Eosinophils Relative: 1 %
HCT: 30.6 % — ABNORMAL LOW (ref 39.0–52.0)
Hemoglobin: 9.8 g/dL — ABNORMAL LOW (ref 13.0–17.0)
Immature Granulocytes: 2 %
Lymphocytes Relative: 10 %
Lymphs Abs: 0.9 10*3/uL (ref 0.7–4.0)
MCH: 31.4 pg (ref 26.0–34.0)
MCHC: 32 g/dL (ref 30.0–36.0)
MCV: 98.1 fL (ref 80.0–100.0)
Monocytes Absolute: 1 10*3/uL (ref 0.1–1.0)
Monocytes Relative: 11 %
Neutro Abs: 6.4 10*3/uL (ref 1.7–7.7)
Neutrophils Relative %: 75 %
Platelets: 129 10*3/uL — ABNORMAL LOW (ref 150–400)
RBC: 3.12 MIL/uL — ABNORMAL LOW (ref 4.22–5.81)
RDW: 15.9 % — ABNORMAL HIGH (ref 11.5–15.5)
WBC: 8.5 10*3/uL (ref 4.0–10.5)
nRBC: 0 % (ref 0.0–0.2)

## 2020-01-11 LAB — BASIC METABOLIC PANEL
Anion gap: 8 (ref 5–15)
BUN: 43 mg/dL — ABNORMAL HIGH (ref 8–23)
CO2: 20 mmol/L — ABNORMAL LOW (ref 22–32)
Calcium: 7.7 mg/dL — ABNORMAL LOW (ref 8.9–10.3)
Chloride: 111 mmol/L (ref 98–111)
Creatinine, Ser: 1.5 mg/dL — ABNORMAL HIGH (ref 0.61–1.24)
GFR, Estimated: 46 mL/min — ABNORMAL LOW (ref >60)
Glucose, Bld: 102 mg/dL — ABNORMAL HIGH (ref 70–99)
Potassium: 4.2 mmol/L (ref 3.5–5.1)
Sodium: 139 mmol/L (ref 135–145)

## 2020-01-11 LAB — TYPE AND SCREEN
ABO/RH(D): O POS
Antibody Screen: NEGATIVE
Unit division: 0
Unit division: 0

## 2020-01-11 LAB — BPAM RBC
Blood Product Expiration Date: 202111292359
Blood Product Expiration Date: 202111292359
ISSUE DATE / TIME: 202110311703
ISSUE DATE / TIME: 202110312023
Unit Type and Rh: 5100
Unit Type and Rh: 5100

## 2020-01-11 MED ORDER — PANTOPRAZOLE SODIUM 40 MG PO TBEC: 40.0000 mg | DELAYED_RELEASE_TABLET | Freq: Two times a day (BID) | ORAL | 0 refills | Status: DC

## 2020-01-11 MED ORDER — CHLORHEXIDINE GLUCONATE 0.12 % MT SOLN: 15.0000 mL | Freq: Two times a day (BID) | OROMUCOSAL | Status: DC

## 2020-01-11 MED ORDER — ORAL CARE MOUTH RINSE
15.0000 mL | Freq: Two times a day (BID) | OROMUCOSAL | Status: DC
  Administered 2020-01-11 (×2): 15 mL via OROMUCOSAL

## 2020-01-11 NOTE — Discharge Instructions (Signed)

## 2020-01-11 NOTE — Progress Notes (Signed)
Mobility Specialist: Progress Note   01/11/20 1717  Mobility  Activity Transferred:  Chair to bed  Level of Assistance Contact guard assist, steadying assist  Assistive Device Sliding board  Mobility Response Tolerated well  Mobility performed by Mobility specialist  Bed Position Semi-fowlers  $Mobility charge 1 Mobility   Pre-Mobility: 71 HR, 156/84 BP Post-Mobility: 79 HR, 97% SpO2  Pt transferred from the chair to the bed utilizing the sliding board. Pt required frequent cues for hand placement during transfer but was able to complete the transfer mostly independent. Pt had no c/o during transfer.   [facility] Lavel Rieman Mobility Specialist

## 2020-01-11 NOTE — Progress Notes (Signed)
Inpatient Rehab Admissions Coordinator Note:   Per PT recommendations, pt was screened for CIR candidacy by Shann Medal, PT, DPT.  At this time it appears that pt is near his baseline (min guard to mod I for slide board transfers 2/2 decreased recall of transfer steps).  Non-ambulatory at baseline.  Feel he will not require the intensity of CIR program and would recommend f/u with an alternative level of care.  Will not request a consult at this time. Please contact me with questions.   Shann Medal, PT, DPT [phone] 01/11/20 10:27 AM

## 2020-01-11 NOTE — Evaluation (Signed)
Clinical/Bedside Swallow Evaluation Patient Details  Name: [name] MRN: [id number] Date of Birth: [birth year]  Today's Date: 01/11/2020 Time: SLP Start Time (ACUTE ONLY): 1001      Past Medical History:  Past Medical History:  Diagnosis Date  . Abnormality of gait 09/22/2015  . Arthritis   . Atrial fibrillation ([place])   . CAD (coronary artery disease)    Stent to RCA, Penta stent, 99% reduced to 0% 2002.  . Cancer ([place])    skin CA removed from back  . 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 Chronic insomnia 03/30/2015  . Complication of anesthesia    trouble waking up  . GERD (gastroesophageal reflux disease)   . Hypercholesteremia   . Hypertension   . Hypothyroidism   . Memory difficulty 09/22/2015  . Osteoarthritis   . Pneumonia   . Seizures ([place])   . Sepsis ([place]) 05/2017  . Transient alteration of awareness 03/30/2015  . Vertigo    hx of   Past Surgical History:  Past Surgical History:  Procedure Laterality Date  . AMPUTATION Right 03/28/2018   Procedure: AMPUTATION BELOW KNEE;  Surgeon: Newt Minion, MD;  Location: [place];  Service: Orthopedics;  Laterality: Right;  . APPLICATION OF WOUND VAC Right 01/23/2019   Procedure: Application Of  Prevena Wound Vac;  Surgeon: Newt Minion, MD;  Location: [place];  Service: Orthopedics;  Laterality: Right;  . BACK SURGERY    . BIOPSY  01/08/2020   Procedure: BIOPSY;  Surgeon: Gatha Mayer, MD;  Location: [place];  Service: Endoscopy;;  . ESOPHAGOGASTRODUODENOSCOPY (EGD) WITH PROPOFOL N/A 01/08/2020   Procedure: ESOPHAGOGASTRODUODENOSCOPY (EGD) WITH PROPOFOL;  Surgeon: Gatha Mayer, MD;  Location: [place];  Service: Endoscopy;  Laterality: N/A;  . EYE SURGERY     Bilateral Cataract surgery   . HERNIA REPAIR    . I & D EXTREMITY Right 05/10/2015   Procedure: IRRIGATION AND DEBRIDEMENT EXTREMITY;  Surgeon: Leanora Cover, MD;  Location: [place];  Service: Orthopedics;   Laterality: Right;  . KNEE ARTHROPLASTY     right knee X 2; left knee once  . LAMINECTOMY     X 6  . LEG AMPUTATION BELOW KNEE Right 03/28/2018  . POSTERIOR CERVICAL FUSION/FORAMINOTOMY  01/28/2012   Procedure: POSTERIOR CERVICAL FUSION/FORAMINOTOMY LEVEL 3;  Surgeon: [name], MD;  Location: [place] [facility];  Service: Neurosurgery;  Laterality: Left;  Posterior Cervical Five-Thoracic One Fusion, Arthrodesis with LEFT Cervical Seven-thoracic One Laminectomy, Foraminotomy and Resection of Synovial Cyst  . POSTERIOR CERVICAL FUSION/FORAMINOTOMY N/A 01/29/2013   Procedure: POSTERIOR CERVICAL FUSION/FORAMINOTOMY LEVEL 1 and C2-5 Posteriolateral Arthrodesis;  Surgeon: [name], MD;  Location: [place] [facility];  Service: Neurosurgery;  Laterality: N/A;  C2-C3 Laminectomy C2-C3 posterior cervical arthrodesis  . STUMP REVISION Right 01/23/2019   Procedure: REVISION RIGHT BELOW KNEE AMPUTATION;  Surgeon: Newt Minion, MD;  Location: [place];  Service: Orthopedics;  Laterality: Right;  . TONSILLECTOMY     HPI:  [name] is a 86 y.o. male with medical history significant of seizures; GERD; dementia; hypothyroidism; HTN; HLD; CAD with stent; PVD s/p R BKA; and afib presenting with melena presented with chest pain. Pt found to have GIB. BSE 2019- full liquids nectar thick. MBS in 2019 showed mild orpharyngeal dysphagia w/o aspiration or penetration, suspected primary aspiration risk was due to pharyngo-cervical esophageal and probable esophageal dysphagia. CXR Scarring at the left lung base. No acute  finding when compared to prior. Wife stated concerns for aspiration with GI MD who ordered ST assessment.    Assessment / Plan / Recommendation Clinical Impression  Pt and wife deny significant s/s aspiration recently and wife concerned re: safe positioning for po's. Oral-motor exam remarkable for lingual candidias and therapist initiated adult oral care protocol. His cough is strong with  volitional swallow. There were no indications of aspiration or increased risks. He exhibited good oral hygiene and brushed teeth after assessment. Swallow precautions posted above bed and reiterated with nursing for upright position. ALso empowered pt to self advocate even if not wanting to be repositioned. Continue regular texture/thin liquids. No further ST needed.         SLP Visit Diagnosis: Dysphagia, unspecified (R13.10)    Aspiration Risk  Mild aspiration risk    Diet Recommendation Regular;Thin liquid   Liquid Administration via: Cup;Straw Medication Administration: Other (Comment) (crush if large) Supervision: Patient able to self feed;Intermittent supervision to cue for compensatory strategies Compensations: Slow rate;Small sips/bites Postural Changes: Seated upright at 90 degrees;Remain upright for at least 30 minutes after po intake    Other  Recommendations Oral Care Recommendations: Oral care BID   Follow up Recommendations None      Frequency and Duration            Prognosis        Swallow Study   General HPI: [name] is a 86 y.o. male with medical history significant of seizures; GERD; dementia; hypothyroidism; HTN; HLD; CAD with stent; PVD s/p R BKA; and afib presenting with melena presented with chest pain. Pt found to have GIB. BSE 2019- full liquids nectar thick. MBS in 2019 showed mild orpharyngeal dysphagia w/o aspiration or penetration, suspected primary aspiration risk was due to pharyngo-cervical esophageal and probable esophageal dysphagia. CXR Scarring at the left lung base. No acute finding when compared to prior. Wife stated concerns for aspiration with GI MD who ordered ST assessment.  Type of Study: Bedside Swallow Evaluation Previous Swallow Assessment:  (see HPI) Diet Prior to this Study: Regular;Thin liquids Temperature Spikes Noted: No Respiratory Status: Room air History of Recent Intubation: No Behavior/Cognition:  Alert;Cooperative;Pleasant mood Oral Cavity Assessment: Within Functional Limits Oral Care Completed by SLP: Other (Comment) (brushed teeth after BSE) Oral Cavity - Dentition: Adequate natural dentition (missign one or two) Vision: Functional for self-feeding Self-Feeding Abilities: Able to feed self Patient Positioning: Upright in bed Baseline Vocal Quality: Normal Volitional Cough: Strong Volitional Swallow: Able to elicit    Oral/Motor/Sensory Function Overall Oral Motor/Sensory Function: Within functional limits   Ice Chips Ice chips: Not tested   Thin Liquid Thin Liquid: Within functional limits Presentation: Cup;Straw    Nectar Thick Nectar Thick Liquid: Not tested   Honey Thick Honey Thick Liquid: Not tested   Puree Puree: Within functional limits   Solid     Solid: Within functional limits      Houston Siren 01/11/2020,12:57 PM Orbie Pyo [email or web address].Ed Risk analyst [phone] Office [phone]

## 2020-01-11 NOTE — Progress Notes (Signed)
Physical Therapy Treatment Patient Details Name: [name] MRN: [id number] DOB: 02-03-1934 Today's Date: 01/11/2020    History of Present Illness [name] is a 86 y.o. male with medical history significant of seizures; dementia; hypothyroidism; HTN; HLD; CAD with stent; PVD s/p R BKA; and afib not on AC (severe spontaneous L leg hemorrhage) presenting with melena presented with chest pain. He developed onset of substernal CP.  It came on after dinner the last 2 nights. He took Rolaids the first night and it seemed to help and then went to sleep with Norco. No nausea or vomiting.  He has had dark stools for the last several days. he has been taking more Rolaids regularly over the last few weeks.  Some SOB with ambulation.  No h/o GI bleeding.  Pt found to have GIB.      PT Comments    Pt supine in bed on arrival.  Reports pain radiating down R hip.  Pt reports feeling better with change in position.  Pt tolerated OOB to recliner with slide board and min assistance.  Pt performed LE exercises with noticeable fatigue.  CIR reports he is not a candidate at this time.  Based on denial will update recommendations to HHPT as he declines snf placement.  Will inform supervising PT of need for change in recommendations at this time.    Follow Up Recommendations  Supervision/Assistance - 24 hour;Home health PT (CIR denied placement at this time.)     Equipment Recommendations  Rolling walker with 5" wheels    Recommendations for Other Services       Precautions / Restrictions Precautions Precautions: Fall Required Braces or Orthoses: Other Brace Other Brace: has prosthesis but hasn't been wearing it due to wounds Restrictions Weight Bearing Restrictions: No    Mobility  Bed Mobility Overal bed mobility: Needs Assistance Bed Mobility: Rolling Rolling: Min assist   Supine to sit: Min assist     General bed mobility comments: Min assistance to roll and place bed pad and min  assistance to elevate trunk into a seated position.  Transfers Overall transfer level: Needs assistance Equipment used: Sliding board Transfers: Lateral/Scoot Transfers          Lateral/Scoot Transfers: Min assist;With Tax inspector transfer comment: Min assistance to move from surface to surface with slide board.  Cues to lean forward and push with LLE from bed to recline.  Use of bed pad on slide board.  Ambulation/Gait Ambulation/Gait assistance:  (Unable at this time.)               Stairs             Wheelchair Mobility    Modified Rankin (Stroke Patients Only)       Balance Overall balance assessment: Needs assistance Sitting-balance support: No upper extremity supported;Feet supported Sitting balance-Leahy Scale: Fair Sitting balance - Comments: able to long sit with only intermittent single extremity support     Standing balance-Leahy Scale:  (did not assess, patient functioning from chair level at this time.)                              Cognition Arousal/Alertness: Awake/alert Behavior During Therapy: Flat affect;WFL for tasks assessed/performed Overall Cognitive Status: History of cognitive impairments - at baseline  Exercises General Exercises - Lower Extremity Ankle Circles/Pumps: AROM;Left;20 reps;Supine Quad Sets: AROM;Both;10 reps;Supine Heel Slides: AROM;Left;10 reps;Supine Hip ABduction/ADduction: AROM;Both;10 reps;Supine Straight Leg Raises: AROM;Both;10 reps;Supine Amputee Exercises Hip Flexion/Marching: AROM;Right;10 reps;Supine    General Comments        Pertinent Vitals/Pain Pain Assessment: No/denies pain    Home Living                      Prior Function            PT Goals (current goals can now be found in the care plan section) Acute Rehab PT Goals Patient Stated Goal: to get rehab and be stronger and go home Potential to Achieve  Goals: Good Additional Goals Additional Goal #1: Pt to be able to manipulate wheelchair parts for a transfer with independence. Progress towards PT goals: Progressing toward goals    Frequency    Min 3X/week      PT Plan Current plan remains appropriate    Co-evaluation              AM-PAC PT "6 Clicks" Mobility   Outcome Measure  Help needed turning from your back to your side while in a flat bed without using bedrails?: None Help needed moving from lying on your back to sitting on the side of a flat bed without using bedrails?: None Help needed moving to and from a bed to a chair (including a wheelchair)?: A Little Help needed standing up from a chair using your arms (e.g., wheelchair or bedside chair)?: A Lot Help needed to walk in hospital room?: Total Help needed climbing 3-5 steps with a railing? : Total 6 Click Score: 15    End of Session Equipment Utilized During Treatment: Gait belt Activity Tolerance: Patient limited by fatigue Patient left: in chair;with call bell/phone within reach;with chair alarm set;with family/visitor present Nurse Communication: Mobility status PT Visit Diagnosis: Muscle weakness (generalized) (M62.81)     Time: 9826-4158 PT Time Calculation (min) (ACUTE ONLY): 19 min  Charges:  $Therapeutic Activity: 8-22 mins                     Erasmo Leventhal , PTA Acute Rehabilitation Services Pager [phone] Office [phone]     Emmitt Matthews Eli Hose 01/11/2020, 11:33 AM

## 2020-01-11 NOTE — Progress Notes (Signed)
Patient well-known to Dr. Jess Barters practice.  He is status post revision right knee below-knee amputation 10 months ago.  He has been slow to heal and been followed by Dr. [name] for a small ulcer that has been slow to heal at the amputation site.  It was recommended that he continue with routine wound care and the compression sock.  We had recommended that he avoid pressure over the residual limb until it was healed.  His wife contacted our office on Friday because she was concerned that he was having a dressing placed beneath the sock  Wound is at the amputation stump is approximately 3 cm in diameter.  Does not probe deeply no surrounding cellulitis or foul odor.  There was a small piece of silver cell over the area.  Continue current wound care patient should follow-up in our office a week after his discharge from the hospital will make Dr. [name] aware of his admission

## 2020-01-11 NOTE — Progress Notes (Signed)
D/C instructions given to patient and wife. Medications reviewed. IV removed, clean and intact. All questions answered. Wife to escort pt home.  Clyde Canterbury, RN

## 2020-01-11 NOTE — TOC Transition Note (Signed)
Transition of Care (TOC) - CM/SW Discharge Note Marvetta Gibbons RN, BSN Transitions of Care Unit 4E- RN Case Manager See Treatment Team for direct phone #    Patient Details  Name: [name] MRN: [id number] Date of Birth: 12-28-1933  Transition of Care [facility]) CM/SW Contact:  Dawayne [name], RN Phone Number: 01/11/2020, 2:28 PM   Clinical Narrative:    Pt stable for transition home, has been screened by CIR and per note by AC C. Cletus Gash pt not a candidate for INPT rehab program. Pt/wife want to return home with [facility]. Orders have been placed for HHPT/OT- CM in to speak with pt and wife at bedside regarding [place] needs- per conversation pt has all needed DME needs, has had [place] in past with [facility]- list provided Per CMS guidelines from medicare.gov website with star ratings (copy placed in shadow chart) per wife and pt they would like to use [facility] again for [facility] needs.  Wife to transport home later today.   Call made to [facility] with [facility]- referral has been accepted for HHPT/OT needs.    Final next level of care: [place] Barriers to Discharge: No Barriers Identified   Patient Goals and CMS Choice Patient states their goals for this hospitalization and ongoing recovery are:: return home and get back to prior level of function before getting sick CMS Medicare.gov Compare Post Acute Care list provided to:: Patient Choice offered to / list presented to : Patient, Spouse  Discharge Placement               Home with [facility]        Discharge Plan and Services   Discharge Planning Services: CM Consult Post Acute Care Choice: Home Health          DME Arranged: N/A DME Agency: NA       HH Arranged: PT, OT [place] Agency: [place] Date [place]: 01/11/20 Time [place]: 1350 Representative spoke with at [place]: [place] ([place]) Interventions     Readmission Risk Interventions Readmission Risk  Prevention Plan 01/11/2020  Transportation Screening Complete  PCP or Specialist Appt within 5-7 Days Complete  Home Care Screening Complete  Medication Review (RN CM) Complete  Some recent data might be hidden

## 2020-01-11 NOTE — Discharge Summary (Signed)
Physician Discharge Summary  [name] QIH:[id number] DOB: 1933/08/25 DOA: 01/07/2020  PCP: [name], MD  Admit date: 01/07/2020 Discharge date: 01/11/2020  Admitted From: Home Disposition: Home  Recommendations for Outpatient Follow-up:  1. Follow up with PCP in 1-2 weeks 2. Follow-up with GI in 4 weeks 3. Please obtain BMP/CBC in one week 4. Please follow up with your PCP on the following pending results: Unresulted Labs (From admission, onward)         None       Home Health: Yes Equipment/Devices: Walker  Discharge Condition: Stable CODE STATUS: DNR Diet recommendation: Cardiac  Subjective: Seen and examined.  Wife at the bedside.  Patient has no complaint.  Patient was very adamant about going home instead of going to CIR as he did not believe that he has the strength needed to work with PT 3 hours a day at [facility] but ironically, he was declined by CIR anyway due to his baseline nonambulatory status.  Brief/Interim Summary: Robert Blick Dowdyis a 86 y.o.malewith medical history significant ofseizures; dementia; hypothyroidism; HTN; HLD; CAD with stent; PVD s/p R BKA; and afib not on AC (severe spontaneous L leg hemorrhage) presenting with melena presented with chest pain.He developed onset of substernal CP. It came on after dinner the last 2 nights.He took Rolaids the first night and it seemed to help and then went to sleep with Norco.No nausea or vomiting. He has had dark stools for the last several days. he has been taking more Rolaids regularly over the last few weeks. Some SOB with ambulation. No h/o GI bleeding. He takes ASA 81 mg daily but no other NSAIDs other than rub or patch according to patient and wife, but he is taking daily Mobic based on his Med Rec.  He is not ambulatory, uses a sliding board for transfers. Stopped PT about 6 weeks ago, noticing ulcers on his stump that are improving. Last saw the PA on 10/19 for this issue. He is extremely  slow to heal and bruises easily.  Upon arrival to ED, he was hemodynamically stable.  He was then found to have hemoglobin of 8.6, drop from 11 just 2 and half months ago.  FOBT was positive.  He was admitted under hospital service and GI was consulted. Underwent EGD on 01/08/2020 with Dr. Carlean Purl and was found to have nonbleeding duodenal ulcer.  His hemoglobin dropped to 7.3 on 01/10/2020 for with GI ordered 2 units PRBC transfusion and his hemoglobin today is 9.3.  Patient has not had any further episode of bleeding.  He was cleared by GI to discharge on Protonix 40 mg p.o. twice daily.  He was evaluated by PT OT who recommended CIR however he was evaluated by CIR today and was declined due to his baseline nonambulatory status.  After long discussion with the patient this morning and again with his wife over the phone, with mutual agreement, patient is being discharged home with home health.  He has been clearly advised by myself and GI to avoid any sort of NSAIDs.  He also has nonhealing ulcer at the stump site for which she was seen by Dr. Jess Barters PA today who recommended outpatient follow-up in clinic in a week.  He is being discharged in stable condition.  Discharge Diagnoses:  Principal Problem:   Gastrointestinal hemorrhage Active Problems:   Essential hypertension   Hypothyroidism   Seizure disorder (HCC)   Acute kidney injury (HCC)   Chronic pain   Stage 3a chronic kidney  disease   Chronic diastolic (congestive) heart failure (HCC)   Elevated troponin   Chronic atrial fibrillation   S/P BKA (below knee amputation) unilateral, right (HCC)   Atypical chest pain   Chronic duodenal ulcer with hemorrhage   Acute blood loss anemia    Discharge Instructions   Allergies as of 01/11/2020      Reactions   Eliquis [apixaban] Other (See Comments)   bleeding   Demerol [meperidine] Nausea And Vomiting   Keppra [levetiracetam] Other (See Comments)   Causes sleepiness, mental status  changes   Vimpat [lacosamide]    Over sedated       Medication List    STOP taking these medications   meloxicam 15 MG tablet Commonly known as: MOBIC     TAKE these medications   alendronate 70 MG tablet Commonly known as: FOSAMAX Take 70 mg by mouth every Wednesday. Remain upright for 30-60 minutes.   aspirin EC 81 MG tablet Take 81 mg by mouth daily.   Calcium 600+D 600-800 MG-UNIT Tabs Generic drug: Calcium Carb-Cholecalciferol Take 1 tablet by mouth daily.   CertaVite/Antioxidants Tabs Take 1 tablet by mouth every evening.   diltiazem 240 MG 24 hr capsule Commonly known as: CARDIZEM CD TAKE 1 CAPSULE BY MOUTH EVERY MORNING. HOLD IF SYSTOLIC BLOOD PRESSURE IS LESS THAN 100 What changed: See the new instructions.   divalproex 250 MG 24 hr tablet Commonly known as: DEPAKOTE ER Take 250 mg by mouth in the morning and at bedtime.   doxazosin 4 MG tablet Commonly known as: CARDURA TAKE 1 TABLET(4 MG) BY MOUTH AT BEDTIME What changed: See the new instructions.   furosemide 20 MG tablet Commonly known as: LASIX Take 1 tablet (20 mg total) by mouth daily.   HYDROcodone-acetaminophen 10-325 MG tablet Commonly known as: NORCO Take 1 tablet by mouth every 4 (four) hours as needed for pain.   levothyroxine 150 MCG tablet Commonly known as: SYNTHROID Take 150 mcg by mouth daily before breakfast.   metoprolol tartrate 25 MG tablet Commonly known as: LOPRESSOR TAKE 1 TABLET BY MOUTH TWICE DAILY WITH A MEAL OR IMMEDIATELY AFTER What changed: See the new instructions.   nitrofurantoin 100 MG capsule Commonly known as: MACRODANTIN Take 100 mg by mouth daily.   nitroGLYCERIN 0.4 MG SL tablet Commonly known as: NITROSTAT Place 1 tablet (0.4 mg total) under the tongue every 5 (five) minutes as needed for chest pain (call 911 afer 3 doses and if chest pain persists).   pantoprazole 40 MG tablet Commonly known as: PROTONIX Take 1 tablet (40 mg total) by mouth 2 (two)  times daily.   polyethylene glycol 17 g packet Commonly known as: MIRALAX / GLYCOLAX Take 17 g by mouth at bedtime.   potassium chloride SA 20 MEQ tablet Commonly known as: KLOR-CON Take 0.5 tablets (10 mEq total) by mouth daily.   pravastatin 40 MG tablet Commonly known as: PRAVACHOL Take 40 mg by mouth every evening.   predniSONE 1 MG tablet Commonly known as: DELTASONE Take 9 mg by mouth daily.            Durable Medical Equipment  (From admission, onward)         Start     Ordered   01/11/20 1201  For home use only DME Walker rolling  Once       Question Answer Comment  Walker: With 5 Inch Wheels   Patient needs a walker to treat with the following condition Balance problem  01/11/20 1200          Follow-up Information    [name], MD Follow up in 1 week(s).   Specialty: Internal Medicine Contact information: [place] [address] [phone]        Sanda Klein, MD .   Specialty: Cardiology Contact information: [address] [place] [address] [phone]        Gatha Mayer, MD Follow up in 4 week(s).   Specialty: Gastroenterology Contact information: [address] [address] [phone]              Allergies  Allergen Reactions  . Eliquis [Apixaban] Other (See Comments)    bleeding  . Demerol [Meperidine] Nausea And Vomiting  . Keppra [Levetiracetam] Other (See Comments)    Causes sleepiness, mental status changes  . Vimpat [Lacosamide]     Over sedated     Consultations: GI   Procedures/Studies: DG Chest 2 View  Result Date: 01/07/2020 CLINICAL DATA:  Chest pain EXAM: CHEST - 2 VIEW COMPARISON:  12/20/2018 FINDINGS: Streaky density at the left lung base, unchanged and attributed to scarring. There is no edema, consolidation, effusion, or pneumothorax. Normal heart size and mediastinal contours when allowing for rotation. Coronary atherosclerosis.  Advanced lower thoracic and lumbar spine degeneration IMPRESSION: Scarring at the left lung base. No acute finding when compared to prior. Electronically Signed   By: Monte Fantasia M.D.   On: 01/07/2020 05:52      Discharge Exam: Vitals:   01/11/20 0720 01/11/20 1049  BP: (!) 144/69 118/68  Pulse: 75 68  Resp: 16 20  Temp: 98.6 F (37 C) 98.7 F (37.1 C)  SpO2: 100% 100%   Vitals:   01/11/20 0200 01/11/20 0300 01/11/20 0720 01/11/20 1049  BP:   (!) 144/69 118/68  Pulse: 68 71 75 68  Resp: 19 13 16 20   Temp:   98.6 F (37 C) 98.7 F (37.1 C)  TempSrc:   Oral Oral  SpO2: 100% 100% 100% 100%  Height:        General: Pt is alert, awake, not in acute distress Cardiovascular: RRR, S1/S2 +, no rubs, no gallops Respiratory: CTA bilaterally, no wheezing, no rhonchi Abdominal: Soft, NT, ND, bowel sounds + Extremities: no edema, no cyanosis    The results of significant diagnostics from this hospitalization (including imaging, microbiology, ancillary and laboratory) are listed below for reference.     Microbiology: Recent Results (from the past 240 hour(s))  Respiratory Panel by RT PCR (Flu A&B, Covid) - Nasopharyngeal Swab     Status: None   Collection Time: 01/07/20  5:52 AM   Specimen: Nasopharyngeal Swab  Result Value Ref Range Status   SARS Coronavirus 2 by RT PCR NEGATIVE NEGATIVE Final    Comment: (NOTE) SARS-CoV-2 target nucleic acids are NOT DETECTED.  The SARS-CoV-2 RNA is generally detectable in upper respiratoy specimens during the acute phase of infection. The lowest concentration of SARS-CoV-2 viral copies this assay can detect is 131 copies/mL. A negative result does not preclude SARS-Cov-2 infection and should not be used as the sole basis for treatment or other patient management decisions. A negative result may occur with  improper specimen collection/handling, submission of specimen other than nasopharyngeal swab, presence of viral mutation(s) within  the areas targeted by this assay, and inadequate number of viral copies (<131 copies/mL). A negative result must be combined with clinical observations, patient history, and epidemiological information. The expected result is Negative.  Fact Sheet for Patients:  PinkCheek.be  Fact Sheet for Healthcare Providers:  GravelBags.it  This test is no t yet approved or cleared by the [place] FDA and  has been authorized for detection and/or diagnosis of SARS-CoV-2 by FDA under an Emergency Use Authorization (EUA). This EUA will remain  in effect (meaning this test can be used) for the duration of the COVID-19 declaration under Section 564(b)(1) of the Act, 21 U.S.C. section 360bbb-3(b)(1), unless the authorization is terminated or revoked sooner.     Influenza A by PCR NEGATIVE NEGATIVE Final   Influenza B by PCR NEGATIVE NEGATIVE Final    Comment: (NOTE) The Xpert Xpress SARS-CoV-2/FLU/RSV assay is intended as an aid in  the diagnosis of influenza from Nasopharyngeal swab specimens and  should not be used as a sole basis for treatment. Nasal washings and  aspirates are unacceptable for Xpert Xpress SARS-CoV-2/FLU/RSV  testing.  Fact Sheet for Patients: PinkCheek.be  Fact Sheet for Healthcare Providers: GravelBags.it  This test is not yet approved or cleared by the [place] FDA and  has been authorized for detection and/or diagnosis of SARS-CoV-2 by  FDA under an Emergency Use Authorization (EUA). This EUA will remain  in effect (meaning this test can be used) for the duration of the  Covid-19 declaration under Section 564(b)(1) of the Act, 21  U.S.C. section 360bbb-3(b)(1), unless the authorization is  terminated or revoked. Performed at [facility], [place] [address], [place], [place] [address]      Labs: BNP (last 3 results) Recent Labs     10/16/19 1220 01/07/20 0507  BNP 628.2* 242.6*   Basic Metabolic Panel: Recent Labs  Lab 01/07/20 0507 01/07/20 0641 01/08/20 0553 01/09/20 0240 01/10/20 0159 01/11/20 0225  NA 141  --  141 139 139 139  K 4.4  --  4.2 4.3 4.5 4.2  CL 106  --  108 106 108 111  CO2 23  --  23 23 23  20*  GLUCOSE 92  --  80 85 98 102*  BUN 49*  --  37* 35* 47* 43*  CREATININE 2.03*  --  1.60* 1.66* 1.58* 1.50*  CALCIUM 9.0  --  8.3* 8.1* 7.9* 7.7*  MG  --  2.1  --   --   --   --    Liver Function Tests: Recent Labs  Lab 01/07/20 0507  AST 19  ALT 11  ALKPHOS 52  BILITOT 0.9  PROT 5.2*  ALBUMIN 3.2*   No results for input(s): LIPASE, AMYLASE in the last 168 hours. No results for input(s): AMMONIA in the last 168 hours. CBC: Recent Labs  Lab 01/08/20 0553 01/08/20 1810 01/09/20 0240 01/10/20 0159 01/11/20 0225  WBC 7.9 8.8 7.2 7.6 8.5  NEUTROABS  --   --   --  6.1 6.4  HGB 8.2* 9.0* 8.5* 7.3* 9.8*  HCT 26.6* 28.9* 26.9* 23.7* 30.6*  MCV 102.7* 104.0* 104.3* 101.7* 98.1  PLT 134* 139* 136* 135* 129*   Cardiac Enzymes: No results for input(s): CKTOTAL, CKMB, CKMBINDEX, TROPONINI in the last 168 hours. BNP: Invalid input(s): POCBNP CBG: Recent Labs  Lab 01/07/20 0511  GLUCAP 89   D-Dimer No results for input(s): DDIMER in the last 72 hours. Hgb A1c No results for input(s): HGBA1C in the last 72 hours. Lipid Profile No results for input(s): CHOL, HDL, LDLCALC, TRIG, CHOLHDL, LDLDIRECT in the last 72 hours. Thyroid function studies No results for input(s): TSH, T4TOTAL, T3FREE, THYROIDAB in the  last 72 hours.  Invalid input(s): FREET3 Anemia work up No results for input(s): VITAMINB12, FOLATE, FERRITIN, TIBC, IRON, RETICCTPCT in the last 72 hours. Urinalysis    Component Value Date/Time   COLORURINE YELLOW 07/10/2019 0630   APPEARANCEUR CLEAR 07/10/2019 0630   LABSPEC 1.015 07/10/2019 0630   PHURINE 7.0 07/10/2019 0630   GLUCOSEU NEGATIVE 07/10/2019 0630   HGBUR  LARGE (A) 07/10/2019 0630   BILIRUBINUR NEGATIVE 07/10/2019 0630   KETONESUR 5 (A) 07/10/2019 0630   PROTEINUR 30 (A) 07/10/2019 0630   UROBILINOGEN 1.0 09/16/2007 1212   NITRITE NEGATIVE 07/10/2019 0630   LEUKOCYTESUR SMALL (A) 07/10/2019 0630   Sepsis Labs Invalid input(s): PROCALCITONIN,  WBC,  LACTICIDVEN Microbiology Recent Results (from the past 240 hour(s))  Respiratory Panel by RT PCR (Flu A&B, Covid) - Nasopharyngeal Swab     Status: None   Collection Time: 01/07/20  5:52 AM   Specimen: Nasopharyngeal Swab  Result Value Ref Range Status   SARS Coronavirus 2 by RT PCR NEGATIVE NEGATIVE Final    Comment: (NOTE) SARS-CoV-2 target nucleic acids are NOT DETECTED.  The SARS-CoV-2 RNA is generally detectable in upper respiratoy specimens during the acute phase of infection. The lowest concentration of SARS-CoV-2 viral copies this assay can detect is 131 copies/mL. A negative result does not preclude SARS-Cov-2 infection and should not be used as the sole basis for treatment or other patient management decisions. A negative result may occur with  improper specimen collection/handling, submission of specimen other than nasopharyngeal swab, presence of viral mutation(s) within the areas targeted by this assay, and inadequate number of viral copies (<131 copies/mL). A negative result must be combined with clinical observations, patient history, and epidemiological information. The expected result is Negative.  Fact Sheet for Patients:  PinkCheek.be  Fact Sheet for Healthcare Providers:  GravelBags.it  This test is no t yet approved or cleared by the [place] FDA and  has been authorized for detection and/or diagnosis of SARS-CoV-2 by FDA under an Emergency Use Authorization (EUA). This EUA will remain  in effect (meaning this test can be used) for the duration of the COVID-19 declaration under Section 564(b)(1) of  the Act, 21 U.S.C. section 360bbb-3(b)(1), unless the authorization is terminated or revoked sooner.     Influenza A by PCR NEGATIVE NEGATIVE Final   Influenza B by PCR NEGATIVE NEGATIVE Final    Comment: (NOTE) The Xpert Xpress SARS-CoV-2/FLU/RSV assay is intended as an aid in  the diagnosis of influenza from Nasopharyngeal swab specimens and  should not be used as a sole basis for treatment. Nasal washings and  aspirates are unacceptable for Xpert Xpress SARS-CoV-2/FLU/RSV  testing.  Fact Sheet for Patients: PinkCheek.be  Fact Sheet for Healthcare Providers: GravelBags.it  This test is not yet approved or cleared by the [place] FDA and  has been authorized for detection and/or diagnosis of SARS-CoV-2 by  FDA under an Emergency Use Authorization (EUA). This EUA will remain  in effect (meaning this test can be used) for the duration of the  Covid-19 declaration under Section 564(b)(1) of the Act, 21  U.S.C. section 360bbb-3(b)(1), unless the authorization is  terminated or revoked. Performed at [facility], [place] [address] [place], [place] [address]      Time coordinating discharge: Over 30 minutes  SIGNED:   Darliss Cheney, MD  [facility] 01/11/2020, 1:06 PM  If 7PM-7AM, please contact night-coverage [email or web address]

## 2020-01-12 ENCOUNTER — Other Ambulatory Visit: Payer: Self-pay | Admitting: *Deleted

## 2020-01-12 NOTE — Consult Note (Signed)
   [facility] CM Inpatient Consult   01/12/2020  [name] November 27, 1933 [id number]  Patient is currently active with [facility] for chronic disease management services.  Patient has been engaged by a [place] and [facility] LCSW noted.  Our community based plan of care has focused on disease management and community resource support.    Patient will receive a post hospital call and will be evaluated for assessments and disease process education.    Plan: Notified [facility] Team of transition from hospital last night.  Of note, [facility] Care Management services does not replace or interfere with any services that are needed or arranged by inpatient [facility] care management team.  For additional questions or referrals please contact:  Natividad Brood, RN BSN [facility] Liaison  [phone] business mobile phone Toll free office [phone]  Fax number: [phone] [email or web address]

## 2020-01-13 ENCOUNTER — Other Ambulatory Visit: Payer: Self-pay | Admitting: *Deleted

## 2020-01-13 NOTE — Patient Outreach (Signed)
[facility]) Care Management  01/13/2020  MAYSON MCNEISH 1934-01-12 [id number]  Successful telephone outreach call to patient's spouse [name]. HIPAA identifiers obtained. Nurse called to inquire how the patient was doing after being discharged from the hospital and to ask if there were any questions, concerns, or needs. Per wife [facility] came to see the patient today. He will receive PT, OT, and RN services. [name] states that at this time she feels supported. She shared that she needs to go to the pharmacy to pick up medication and asked if this nurse could call her back within the next several days.  Plan: RN Health Coach will call patient/spouse within the next several days and patient/spouse agrees to future outreach calls.   Emelia Loron RN, BSN [place] [phone] [email or web address]

## 2020-01-13 NOTE — Patient Outreach (Signed)
[facility]) Care Management  01/12/2020  [name] [birth year] [id number]   CSW was able to make contact with patient's wife, [name] today, to follow-up regarding social work services and resources for patient, as well as to ensure that patient has everything he needs at home to be successful, especially after recently being discharged from the hospital.  Mrs. [name] reported that she frantically drove patient to the Emergency Department at [facility], on the evening of Thursday, January 07, 2020, because patient was convinced that he was having a heart attack.  Once hospitalized, patient was found to have a Gastrointestinal Hemorrhage, and received a transfusion of 2 units of packed red blood cells.  Patient has a Chronic Duodenal Ulcer and has been strictly advised not to take NSAID's, other than in the form of a cream, rub or patch.  Mrs. [name] believes that patient developed the chest pain as a result of the bleeding ulcer, as the pain has since resolved.   Mrs. [name] indicated that patient has since been taken off of his medication prescribed for scrotum pain, believing that this was the cause of patient's bleeding ulcer.  Mrs. [name] stated, "I am only speculating here, but maybe [name] wasn't getting enough oxygen to the brain, from the bleeding ulcer, which was causing him a great deal of confusion".  CSW explained to Mrs. [name] that this was certainly out of CSW's scope of practice, encouraging her to converse directly with patient's gastroenterologist or primary care physician about her concerns.  Mrs. [name] then verbalized, "Well, either way, [name] is a lot clearer, happier and has a healthy appetite, for a change".  CSW was able to confirm with Mrs. [name] that home health physical therapy and occupational therapy services have been arranged for patient through [facility].  Mrs. [name] is also hoping to receive home health nursing services, as well.    Mrs. [name] confirmed receipt of the resource packet that CSW mailed to her home, admitting that she had plenty of time to review the contents of the packet while patient was hospitalized.  Mrs. [name] went on to explain that she plans to enlist the help of one of patient's three sons to assist her with completion of the required paperwork, necessary to apply for Aid and Attendance Benefits through [employer].  CSW also offered to assist Mrs. [name] with completion of the application.  Mrs. [name] encouraged CSW to follow-up with her again next week, preferably on Wednesday, November 10th, around 11:00 AM, as she will have had an opportunity to converse with patient's sons to obtain assistance.  CSW voiced understanding and was agreeable to this plan, but encouraged Mrs. [name] to contact CSW directly if she has questions about the application, or if additional social work needs arise in the meantime.    [name], BSW, MSW, LCSW  Licensed Education officer, [facility]  Mailing [place] [address] [place], [place] [address] Physical Address-[address], [place], [place] [address] Toll Free Main # [phone] Fax # [phone] Cell # [phone]  [email or web address]

## 2020-01-14 ENCOUNTER — Encounter: Payer: Self-pay | Admitting: Physician Assistant

## 2020-01-14 ENCOUNTER — Ambulatory Visit (INDEPENDENT_AMBULATORY_CARE_PROVIDER_SITE_OTHER): Payer: Medicare Other | Admitting: Orthopedic Surgery

## 2020-01-14 VITALS — Ht 69.0 in | Wt 155.0 lb

## 2020-01-14 DIAGNOSIS — Z89511 Acquired absence of right leg below knee: Secondary | ICD-10-CM

## 2020-01-15 ENCOUNTER — Other Ambulatory Visit: Payer: Self-pay | Admitting: *Deleted

## 2020-01-15 NOTE — Patient Outreach (Signed)
[facility]) Care Management  [facility] Social Work  01/15/2020  KHYRAN RIERA 09/14/1933 [id number]  Subjective: Successful telephone outreach call to patient's wife [name]. HIPAA identifiers obtained. Wife reported that the patient fell last night while trying to use his transfer board to get from his bed to wheelchair. Wife called the fire department that is located a few blocks away and they came quickly to help pick the patient off of the floor. [name] explained that the patient did not get injured and was checked out by the fire department team. [name] states that the patient is doing well after his recent hospitalization on 01/07/20. Patient is returning back to baseline and [facility] will assist with care. [name] states that the initial assessment by Alvis Lemmings has already taken place. Alvis Lemmings OT comes today to assess for home needs and she was informed that a Psychologist, prison and probation services, PT and SW will also be apart of the home team. Wife was concerned about patient's BKA stump after his discharge stating that it had red areas that have developed into small scabs. Patient was able to be seen by Dr. Jess Barters office 01/14/20 and the wife will continue with her pre-hospitalization dressing changes. [name] states they have an upcoming appointment to see a NP from Dr. Shon Baton office as a hospital follow-up and that she will also make an appointment to see the patient's urologist. Currently Ms. Tadlock feels supported and does not have any further questions. She does confirm that she has this nurse's contact number and will call her if needed.   Encounter Medications:  Outpatient Encounter Medications as of 01/15/2020  Medication Sig Note  . alendronate (FOSAMAX) 70 MG tablet Take 70 mg by mouth every Wednesday. Remain upright for 30-60 minutes.   Marland Kitchen aspirin EC 81 MG tablet Take 81 mg by mouth daily.   . Calcium Carb-Cholecalciferol (CALCIUM 600+D) 600-800 MG-UNIT TABS Take 1 tablet by mouth  daily.   Marland Kitchen diltiazem (CARDIZEM CD) 240 MG 24 hr capsule TAKE 1 CAPSULE BY MOUTH EVERY MORNING. HOLD IF SYSTOLIC BLOOD PRESSURE IS LESS THAN 100 (Patient taking differently: Take 240 mg by mouth daily. )   . divalproex (DEPAKOTE ER) 250 MG 24 hr tablet Take 250 mg by mouth in the morning and at bedtime.    Marland Kitchen doxazosin (CARDURA) 4 MG tablet TAKE 1 TABLET(4 MG) BY MOUTH AT BEDTIME (Patient taking differently: Take 4 mg by mouth at bedtime. )   . furosemide (LASIX) 20 MG tablet Take 1 tablet (20 mg total) by mouth daily.   Marland Kitchen HYDROcodone-acetaminophen (NORCO) 10-325 MG tablet Take 1 tablet by mouth every 4 (four) hours as needed for pain.   Marland Kitchen levothyroxine (SYNTHROID, LEVOTHROID) 150 MCG tablet Take 150 mcg by mouth daily before breakfast.   . metoprolol tartrate (LOPRESSOR) 25 MG tablet TAKE 1 TABLET BY MOUTH TWICE DAILY WITH A MEAL OR IMMEDIATELY AFTER (Patient taking differently: Take 25 mg by mouth 2 (two) times daily. )   . Multiple Vitamins-Minerals (CERTAVITE/ANTIOXIDANTS) TABS Take 1 tablet by mouth every evening.    . nitrofurantoin (MACRODANTIN) 100 MG capsule Take 100 mg by mouth daily. 01/07/2020: Continuous   . nitroGLYCERIN (NITROSTAT) 0.4 MG SL tablet Place 1 tablet (0.4 mg total) under the tongue every 5 (five) minutes as needed for chest pain (call 911 afer 3 doses and if chest pain persists).   . pantoprazole (PROTONIX) 40 MG tablet Take 1 tablet (40 mg total) by mouth 2 (two) times daily.   Marland Kitchen  polyethylene glycol (MIRALAX / GLYCOLAX) 17 g packet Take 17 g by mouth at bedtime.   . potassium chloride SA (KLOR-CON) 20 MEQ tablet Take 0.5 tablets (10 mEq total) by mouth daily.   . pravastatin (PRAVACHOL) 40 MG tablet Take 40 mg by mouth every evening.    . predniSONE (DELTASONE) 1 MG tablet Take 9 mg by mouth daily.    No facility-administered encounter medications on file as of 01/15/2020.    Functional Status:  In your present state of health, do you have any difficulty performing the  following activities: 01/07/2020 01/07/2020  Hearing? - N  Vision? - N  Difficulty concentrating or making decisions? - N  Walking or climbing stairs? - Y  Comment - -  Dressing or bathing? - N  Comment - -  Doing errands, shopping? [place] and eating ? - -  Comment - -  Using the Toilet? - -  Comment - -  In the past six months, have you accidently leaked urine? - -  Comment - -  Do you have problems with loss of bowel control? - -  Managing your Medications? - -  Comment - -  Managing your Finances? - -  Housekeeping or managing your Housekeeping? - -  Comment - -  Some recent data might be hidden    Fall/Depression Screening:  PHQ 2/9 Scores 01/05/2020 12/15/2019 06/03/2018 04/07/2018 09/04/2017  PHQ - 2 Score 2 2 2  0 0  PHQ- 9 Score 5 7 4  - -    Assessment:  Goals Addressed            This Visit's Progress   . COMPLETED: Eat Healthy   On track    Follow Up Date 03/10/20    - change to whole grain breads, cereal, pasta - limit fast food meals to no more than 1 per week - prepare main meal at home 3 to 5 days each week - set a realistic goal    Why is this important?   When you are ready to manage your nutrition or weight, having a plan and setting goals will help.  Taking small steps to change how you eat and exercise is a good place to start.    Notes: Wife prepares heart healthy, low sodium, high protein foods for patient. Patient supplements with Ensure. Wife is well educated regarding patient's nutritional needs and special diets for chronic disease management.  Updated: 01/15/20 and completed     . COMPLETED: Manage My Medicine   On track    Follow Up Date 03/10/20    - call for medicine refill 2 or 3 days before it runs out - call if I am sick and can't take my medicine - keep a list of all the medicines I take; vitamins and herbals too - learn to read medicine labels    Why is this important?   These steps will help you keep  on track with your medicines.    Notes: Wife gives patient his daily medication. Wife has no difficulties administering patient's medication. She is well informed regarding his medications and has a current list of all of the patient's medications. Wife does contact providers as needed with any concerns or prescription refill needs.   Updated: 01/15/20 and completed    . Track and Manage Fluids and Swelling   On track    Follow Up Date 03/10/20    - use salt in moderation - watch  for swelling in feet, ankles and legs every day    Why is this important?   It is important to check your weight daily and watch how much salt and liquids you have.  It will help you to manage your heart failure.    Notes: Patient cannot weigh due to BKA. Wife has been instructed to measure calf and if > than 14 inches double lasix and potassium. Patient adheres to a low salt diet.   Updated: 01/15/20    . Track and Manage My Blood Pressure   On track    Follow Up Date 03/10/20    - check blood pressure daily - write blood pressure results in a log or diary    Why is this important?   You won't feel high blood pressure, but it can still hurt your blood vessels.  High blood pressure can cause heart or kidney problems. It can also cause a stroke.  Making lifestyle changes like losing a little weight or eating less salt will help.  Checking your blood pressure at home and at different times of the day can help to control blood pressure.  If the doctor prescribes medicine remember to take it the way the doctor ordered.  Call the office if you cannot afford the medicine or if there are questions about it.     Notes: wife takes the patient's B/P daily and records the values. Wife will call the provider with concerning values.  Updated 01/15/20    . Track and Manage Symptoms   On track    Follow Up Date 03/10/20    - begin a heart failure diary - develop a rescue plan - eat more whole grains, fruits and  vegetables, lean meats and healthy fats - follow rescue plan if symptoms flare-up - know when to call the doctor - track symptoms and what helps feel better or worse - dress right for the weather, hot or cold    Why is this important?   You will be able to handle your symptoms better if you keep track of them.  Making some simple changes to your lifestyle will help.  Eating healthy is one thing you can do to take good care of yourself.    Notes: Wife has action plan to measure calf circumference if > 14 inches double lasix and potassium for the day due to patient cannot weigh Updated 01/15/20      Plan: [place] I will call patient within the month of December and patient agrees to future outreach calls.   Emelia Loron RN, BSN [place] [phone] [email or web address]

## 2020-01-18 ENCOUNTER — Ambulatory Visit (INDEPENDENT_AMBULATORY_CARE_PROVIDER_SITE_OTHER): Payer: Medicare Other | Admitting: Orthopedic Surgery

## 2020-01-18 ENCOUNTER — Encounter: Payer: Self-pay | Admitting: Physician Assistant

## 2020-01-18 DIAGNOSIS — T8781 Dehiscence of amputation stump: Secondary | ICD-10-CM

## 2020-01-18 DIAGNOSIS — Z89511 Acquired absence of right leg below knee: Secondary | ICD-10-CM

## 2020-01-18 MED ORDER — DOXYCYCLINE HYCLATE 100 MG PO TABS
100.0000 mg | ORAL_TABLET | Freq: Two times a day (BID) | ORAL | 0 refills | Status: DC
Start: 2020-01-18 — End: 2020-05-23

## 2020-01-18 NOTE — Progress Notes (Signed)
Office Visit Note   Patient: [name]           Date of Birth: 1933-11-03           MRN: [id number] Visit Date: 01/18/2020              Requested by: [name], [place],  [place] [address] PCP: [name], MD  Chief Complaint  Patient presents with  . Right Knee - Pain      HPI: Patient is an 86 year old gentleman who was seen in follow-up for a new blister ulcer in the popliteal fossa as well as over the fibular head patient's wife states he has some bleeding over the fibular head wound.  Assessment & Plan: Visit Diagnoses:  1. Dehiscence of amputation stump ([place])   2. S/P BKA (below knee amputation) unilateral, right (HCC)     Plan: Recommended they use the looser shrinker to decrease pressure in the popliteal fossa continue to use the shrinker continue Dial soap cleansing a prescription called in for doxycycline.  Follow-Up Instructions: Return in about 1 week (around 01/25/2020).   Ortho Exam  Patient is alert, oriented, no adenopathy, well-dressed, normal affect, normal respiratory effort. Examination patient has some bruising in the popliteal fossa no cellulitis.  There is some ischemic changes to the fibular head appears to be a pressure area from resting on the fibular head or blunt trauma from the wheelchair.  Will start the patient on doxycycline.  There is no surrounding cellulitis there is ischemic changes to the pressure point.  Imaging: No results found. No images are attached to the encounter.  Labs: Lab Results  Component Value Date   HGBA1C 5.7 (H) 03/26/2018   HGBA1C 5.0 07/12/2017   HGBA1C 5.8 (H) 07/06/2015   ESRSEDRATE 24 (H) 03/24/2018   ESRSEDRATE 15 04/18/2017   ESRSEDRATE 5 09/17/2007   CRP 3.1 (H) 05/11/2018   CRP 1.8 (H) 03/24/2018   CRP <0.8 04/24/2017   LABURIC 6.0 03/09/2016   REPTSTATUS 07/12/2019 FINAL 07/10/2019   GRAMSTAIN  07/15/2017    WBC PRESENT,BOTH PMN AND MONONUCLEAR NO ORGANISMS  SEEN Performed at [facility], [place] [address], [place], [place] [address]    CULT >=100,000 COLONIES/mL ESCHERICHIA COLI (A) 07/10/2019   LABORGA ESCHERICHIA COLI (A) 07/10/2019     Lab Results  Component Value Date   ALBUMIN 3.2 (L) 01/07/2020   ALBUMIN 3.6 10/16/2019   ALBUMIN 3.6 12/20/2018   PREALBUMIN 36 (H) 11/22/2017   PREALBUMIN 28.8 04/24/2017   LABURIC 6.0 03/09/2016    Lab Results  Component Value Date   MG 2.1 01/07/2020   MG 2.0 05/12/2018   MG 1.9 05/11/2018   No results found for: VD25OH  Lab Results  Component Value Date   PREALBUMIN 36 (H) 11/22/2017   PREALBUMIN 28.8 04/24/2017   CBC EXTENDED Latest Ref Rng & Units 01/11/2020 01/10/2020 01/09/2020  WBC 4.0 - 10.5 K/uL 8.5 7.6 7.2  RBC 4.22 - 5.81 MIL/uL 3.12(L) 2.33(L) 2.58(L)  HGB 13.0 - 17.0 g/dL 9.8(L) 7.3(L) 8.5(L)  HCT 39 - 52 % 30.6(L) 23.7(L) 26.9(L)  PLT 150 - 400 K/uL 129(L) 135(L) 136(L)  NEUTROABS 1.7 - 7.7 K/uL 6.4 6.1 -  LYMPHSABS 0.7 - 4.0 K/uL 0.9 0.6(L) -     There is no height or weight on file to calculate BMI.  Orders:  No orders of the defined types were placed in this encounter.  No orders of the defined types were  placed in this encounter.    Procedures: No procedures performed  Clinical Data: No additional findings.  ROS:  All other systems negative, except as noted in the HPI. Review of Systems  Objective: Vital Signs: There were no vitals taken for this visit.  Specialty Comments:  No specialty comments available.  PMFS History: Patient Active Problem List   Diagnosis Date Noted  . Acute blood loss anemia   . Atypical chest pain   . Chronic duodenal ulcer with hemorrhage   . Gastrointestinal hemorrhage 01/07/2020  . Decreased hemoglobin   . Permanent atrial fibrillation ([place]) 10/24/2019  . Hypercholesterolemia 10/24/2019  . Current chronic use of systemic steroids 10/24/2019  . Macrocytic anemia 10/24/2019  . Dehiscence of amputation stump  ([place])   . Wound infection 05/09/2018  . AKI (acute kidney injury) ([place])   . S/P BKA (below knee amputation) unilateral, right ([place])   . Below-knee amputation with complication, initial encounter ([place])   . Tachypnea   . Post-operative pain   . Subacute osteomyelitis, right ankle and foot ([place])   . Paralysis of right lower extremity ([place])   . TIA (transient ischemic attack) 03/24/2018  . Encephalopathy 03/24/2018  . Cellulitis 03/15/2018  . Hypernatremia 03/15/2018  . Persistent atrial fibrillation ([place]) 11/10/2017  . Severe muscle deconditioning 11/10/2017  . Hemorrhage 10/22/2017  . Coronary artery disease involving native coronary artery of native heart without angina pectoris 10/22/2017  . Acute hypoxemic respiratory failure ([place]) 07/17/2017  . Aspiration syndrome, subsequent encounter 07/11/2017  . Aspiration pneumonia ([place]) 07/01/2017  . Acute encephalopathy 07/01/2017  . Thrombocytopenia ([place]) 07/01/2017  . Chronic diastolic (congestive) heart failure ([place]) 07/01/2017  . Anemia of chronic disease 07/01/2017  . Elevated troponin 07/01/2017  . Chronic atrial fibrillation 07/01/2017  . Goals of care, counseling/discussion   . Palliative care by specialist   . Acute metabolic encephalopathy 39/76/7341  . Stage 3a chronic kidney disease   . CAP (community acquired pneumonia) 04/17/2017  . Hallux rigidus, right foot 03/10/2016  . Lumbosacral spondylosis without myelopathy 10/29/2015  . Memory difficulty 09/22/2015  . Abnormality of gait 09/22/2015  . Hyperglycemia 07/06/2015  . Chronic pain 07/06/2015  . Chronic insomnia 03/30/2015  . Transient alteration of awareness 03/30/2015  . Abnormal liver function   . Altered mental status 01/31/2015  . Essential hypertension 01/31/2015  . Constipation 01/31/2015  . Hypothyroidism 01/31/2015  . Seizure disorder ([place]) 01/31/2015  . Bladder outlet obstruction 01/31/2015  . GERD (gastroesophageal reflux disease) 01/31/2015  .  Chronic back pain 01/31/2015  . Acute kidney injury ([place]) 01/31/2015   Past Medical History:  Diagnosis Date  . Abnormality of gait 09/22/2015  . Arthritis   . Atrial fibrillation ([place])   . CAD (coronary artery disease)    Stent to RCA, Penta stent, 99% reduced to 0% 2002.  . Cancer ([place])    skin CA removed from back  . 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 Chronic insomnia 03/30/2015  . Complication of anesthesia    trouble waking up  . GERD (gastroesophageal reflux disease)   . Hypercholesteremia   . Hypertension   . Hypothyroidism   . Memory difficulty 09/22/2015  . Osteoarthritis   . Pneumonia   . Seizures ([place])   . Sepsis ([place]) 05/2017  . Transient alteration of awareness 03/30/2015  . Vertigo    hx of    Family History  Problem Relation Age of Onset  . Hypertension Mother   . Cancer Mother   .  Kidney failure Father   . Heart disease Father     Past Surgical History:  Procedure Laterality Date  . AMPUTATION Right 03/28/2018   Procedure: AMPUTATION BELOW KNEE;  Surgeon: Newt Minion, MD;  Location: [facility];  Service: Orthopedics;  Laterality: Right;  . APPLICATION OF WOUND VAC Right 01/23/2019   Procedure: Application Of  Prevena Wound Vac;  Surgeon: Newt Minion, MD;  Location: [place] [facility];  Service: Orthopedics;  Laterality: Right;  . BACK SURGERY    . BIOPSY  01/08/2020   Procedure: BIOPSY;  Surgeon: Gatha Mayer, MD;  Location: [facility];  Service: Endoscopy;;  . ESOPHAGOGASTRODUODENOSCOPY (EGD) WITH PROPOFOL N/A 01/08/2020   Procedure: ESOPHAGOGASTRODUODENOSCOPY (EGD) WITH PROPOFOL;  Surgeon: Gatha Mayer, MD;  Location: [facility];  Service: Endoscopy;  Laterality: N/A;  . EYE SURGERY     Bilateral Cataract surgery   . HERNIA REPAIR    . I & D EXTREMITY Right 05/10/2015   Procedure: IRRIGATION AND DEBRIDEMENT EXTREMITY;  Surgeon: Leanora Cover, MD;  Location: [facility] [place];  Service: Orthopedics;  Laterality: Right;  .  KNEE ARTHROPLASTY     right knee X 2; left knee once  . LAMINECTOMY     X 6  . LEG AMPUTATION BELOW KNEE Right 03/28/2018  . POSTERIOR CERVICAL FUSION/FORAMINOTOMY  01/28/2012   Procedure: POSTERIOR CERVICAL FUSION/FORAMINOTOMY LEVEL 3;  Surgeon: [name], MD;  Location: [facility];  Service: Neurosurgery;  Laterality: Left;  Posterior Cervical Five-Thoracic One Fusion, Arthrodesis with LEFT Cervical Seven-thoracic One Laminectomy, Foraminotomy and Resection of Synovial Cyst  . POSTERIOR CERVICAL FUSION/FORAMINOTOMY N/A 01/29/2013   Procedure: POSTERIOR CERVICAL FUSION/FORAMINOTOMY LEVEL 1 and C2-5 Posteriolateral Arthrodesis;  Surgeon: [name], MD;  Location: [employer] [facility] [facility];  Service: Neurosurgery;  Laterality: N/A;  C2-C3 Laminectomy C2-C3 posterior cervical arthrodesis  . STUMP REVISION Right 01/23/2019   Procedure: REVISION RIGHT BELOW KNEE AMPUTATION;  Surgeon: Newt Minion, MD;  Location: [facility];  Service: Orthopedics;  Laterality: Right;  . TONSILLECTOMY     Social History   Occupational History  . Occupation: Retired Software engineer  Tobacco Use  . Smoking status: Former Smoker    Packs/day: 1.00    Years: 5.00    Pack years: 5.00  . Smokeless tobacco: Never Used  Vaping Use  . Vaping Use: Never used  Substance and Sexual Activity  . Alcohol use: Not Currently    Comment: rare  . Drug use: No  . Sexual activity: Not Currently

## 2020-01-19 ENCOUNTER — Ambulatory Visit: Payer: Medicare Other | Admitting: Orthopedic Surgery

## 2020-01-19 ENCOUNTER — Ambulatory Visit: Payer: Medicare Other | Admitting: *Deleted

## 2020-01-20 ENCOUNTER — Telehealth: Payer: Self-pay | Admitting: Orthopedic Surgery

## 2020-01-20 ENCOUNTER — Other Ambulatory Visit: Payer: Self-pay | Admitting: *Deleted

## 2020-01-20 ENCOUNTER — Encounter: Payer: Self-pay | Admitting: *Deleted

## 2020-01-20 NOTE — Telephone Encounter (Signed)
I called and sw pt's wife and advised that I spoke with PA and NP and after all the recent events with his hospital admission that he should call PCP did not think the discomfort or burning feeling was coming from abx. To call with any other questions.

## 2020-01-20 NOTE — Telephone Encounter (Signed)
Pt wife called concerned about the new antibiotics that he was proscribed.  He had 3 does of it and he has been having some issues in his chest and they are concerned if its coming from the medication.   Pt wife said you can give her a call at [phone]

## 2020-01-20 NOTE — Patient Outreach (Signed)
[facility]) Care Management  01/20/2020  [name] 06/17/1933 [id number]  Successful telephone outreach call to patient's wife [name] in response to [facility] CSW message that patient's wife has medical questions she needs answered. HIPAA identifiers obtained. [name] reports that the patient is off schedule with taking his prescribed Doxycycline medication and wants to know the best way to get the patient back on schedule without having to consistently wake him up during the middle of the night as the medicine is scheduled for every 12 hours. Nurse placed a call to Pharmacists in network who states that one should wait at least 8 hours in between doses which could allow the patient to get back on track with medication starting tomorrow. It was also discussed that the patient should wait at least 2 hours before or 2 hours after taking the Doxycycline before taking calcium, multi-vitamins, or anti-acids. [name] verbalized understanding and was very appreciative for the information and callback.   Plan: RN Health Coach will call patient within the month of December and patient/wife agrees to future outreach calls.   Emelia Loron RN, BSN [place] [phone] [email or web address]

## 2020-01-20 NOTE — Patient Outreach (Signed)
[facility]) Care Management  01/20/2020  [name] 22-May-1933 [id number]   CSW was able to make contact with patient's wife, Eliazar Olivar today, to follow-up regarding social work services and resources for patient, as well as to assess need for continued social work involvement.  Mrs. Pidcock admitted that she is "extremely frustrated today", trying to get answers from several of patient's physicians as to whether or not he should continue to take antibiotics for his infected wound.  Mrs. Robben proceeded to explain to CSW that she believes that the antibiotics being prescribed to patient is causing his chest pain.  CSW agreed to have Emelia Loron, [place] with [facility], contact Mrs. Uriostegui at her earliest convenience to address her concerns.  Mrs. Schwegel indicated that all of her concerns regarding patient are either medically-related or pharmacy-related, no longer requiring social work involvement.  CSW will perform a case closure on patient, as all goals of treatment have been met from social work standpoint and no additional social work needs have been identified at this time.  CSW will notify patient's Chronic Special Needs Plan RNCM with [facility], Emelia Loron of CSW's plans to close patient's case.  CSW will fax an update to patient's Primary Care Physician, Dr. [name] to ensure that he is aware of CSW's involvement with patient's plan of care, in addition to routing a Physician Case Closure Letter.  CSW was able to confirm that Mrs. Teuscher has the correct contact information for CSW, encouraging her to contact CSW directly if additional social work needs arise in the near future.  Mrs. Sorey voiced understanding and was agreeable to this plan, appreciative of CSW's follow-up calls and support.  [name], BSW, MSW, LCSW  [facility]  [address] [place] [address] [place], [place] [address] Physical Address-[address] [place],  [address] Toll Free Main # [phone] Fax # [phone] Cell # [phone]  [email or web address]

## 2020-01-22 ENCOUNTER — Encounter: Payer: Self-pay | Admitting: Orthopedic Surgery

## 2020-01-22 ENCOUNTER — Ambulatory Visit: Payer: Self-pay | Admitting: *Deleted

## 2020-01-22 ENCOUNTER — Telehealth: Payer: Self-pay | Admitting: Orthopedic Surgery

## 2020-01-22 NOTE — Telephone Encounter (Signed)
Herbert Deaner (PT) called from [facility]. Clair Gulling needs a call back concerning pt refusal to take doxycycline. Patient's wife stated Dr. [name] instructed for her to contact PCP about this matter. Patient's wife stated she has contacted PCP and hasn't received a call back after several attempts. Clair Gulling wants to confirm conversation between pt wife and Dr. [name]. Jim's phone number is [phone]. If he is unable to answer he would like a detailed vm left on secure line.

## 2020-01-22 NOTE — Telephone Encounter (Signed)
I left a message as was instructed for [name] on a secure line.  With regards to [name] his wife did call her in saying he was having burning in his chest which she was wondering was related to the doxycycline.  This would be an unusual reaction and then then immediately aware of.  However given his cardiac history and his general health history I had advised them to call his primary care doctor just to be sure that this was not related to any of his other issues incurring his.  If he wants to stop his doxycycline because he thinks this is related certainly that is his choice

## 2020-01-22 NOTE — Progress Notes (Signed)
Office Visit Note   Patient: [name]           Date of Birth: April 21, 1933           MRN: [id number] Visit Date: 01/14/2020              Requested by: [name], [place],  [place] [address] PCP: [name], MD  Chief Complaint  Patient presents with  . Right Leg - Follow-up    01/23/2019 revision right BKA       HPI: Patient is seen in follow-up for ulcer over the residual limb  Assessment & Plan: Visit Diagnoses:  1. S/P BKA (below knee amputation) unilateral, right ([place])     Plan: Follow-up in 1 week to reevaluate the leg  Follow-Up Instructions: Return in about 4 weeks (around 02/11/2020).   Ortho Exam  Patient is alert, oriented, no adenopathy, well-dressed, normal affect, normal respiratory effort. Patient has persistent ulcer over the residual limb  Imaging: No results found. No images are attached to the encounter.  Labs: Lab Results  Component Value Date   HGBA1C 5.7 (H) 03/26/2018   HGBA1C 5.0 07/12/2017   HGBA1C 5.8 (H) 07/06/2015   ESRSEDRATE 24 (H) 03/24/2018   ESRSEDRATE 15 04/18/2017   ESRSEDRATE 5 09/17/2007   CRP 3.1 (H) 05/11/2018   CRP 1.8 (H) 03/24/2018   CRP <0.8 04/24/2017   LABURIC 6.0 03/09/2016   REPTSTATUS 07/12/2019 FINAL 07/10/2019   GRAMSTAIN  07/15/2017    WBC PRESENT,BOTH PMN AND MONONUCLEAR NO ORGANISMS SEEN Performed at [facility], [place] [address], [place], [place] [address]    CULT >=100,000 COLONIES/mL ESCHERICHIA COLI (A) 07/10/2019   LABORGA ESCHERICHIA COLI (A) 07/10/2019     Lab Results  Component Value Date   ALBUMIN 3.2 (L) 01/07/2020   ALBUMIN 3.6 10/16/2019   ALBUMIN 3.6 12/20/2018   PREALBUMIN 36 (H) 11/22/2017   PREALBUMIN 28.8 04/24/2017   LABURIC 6.0 03/09/2016    Lab Results  Component Value Date   MG 2.1 01/07/2020   MG 2.0 05/12/2018   MG 1.9 05/11/2018   No results found for: VD25OH  Lab Results  Component Value Date   PREALBUMIN 36 (H)  11/22/2017   PREALBUMIN 28.8 04/24/2017   CBC EXTENDED Latest Ref Rng & Units 01/11/2020 01/10/2020 01/09/2020  WBC 4.0 - 10.5 K/uL 8.5 7.6 7.2  RBC 4.22 - 5.81 MIL/uL 3.12(L) 2.33(L) 2.58(L)  HGB 13.0 - 17.0 g/dL 9.8(L) 7.3(L) 8.5(L)  HCT 39 - 52 % 30.6(L) 23.7(L) 26.9(L)  PLT 150 - 400 K/uL 129(L) 135(L) 136(L)  NEUTROABS 1.7 - 7.7 K/uL 6.4 6.1 -  LYMPHSABS 0.7 - 4.0 K/uL 0.9 0.6(L) -     Body mass index is 22.89 kg/m.  Orders:  No orders of the defined types were placed in this encounter.  No orders of the defined types were placed in this encounter.    Procedures: No procedures performed  Clinical Data: No additional findings.  ROS:  All other systems negative, except as noted in the HPI. Review of Systems  Objective: Vital Signs: Ht 5\' 9"  (1.753 m)   Wt 155 lb (70.3 kg)   BMI 22.89 kg/m   Specialty Comments:  No specialty comments available.  PMFS History: Patient Active Problem List   Diagnosis Date Noted  . Acute blood loss anemia   . Atypical chest pain   . Chronic duodenal ulcer with hemorrhage   . Gastrointestinal hemorrhage 01/07/2020  . Decreased  hemoglobin   . Permanent atrial fibrillation ([place]) 10/24/2019  . Hypercholesterolemia 10/24/2019  . Current chronic use of systemic steroids 10/24/2019  . Macrocytic anemia 10/24/2019  . Dehiscence of amputation stump ([place])   . Wound infection 05/09/2018  . AKI (acute kidney injury) ([place])   . S/P BKA (below knee amputation) unilateral, right ([place])   . Below-knee amputation with complication, initial encounter ([place])   . Tachypnea   . Post-operative pain   . Subacute osteomyelitis, right ankle and foot ([place])   . Paralysis of right lower extremity ([place])   . TIA (transient ischemic attack) 03/24/2018  . Encephalopathy 03/24/2018  . Cellulitis 03/15/2018  . Hypernatremia 03/15/2018  . Persistent atrial fibrillation ([place]) 11/10/2017  . Severe muscle deconditioning 11/10/2017  . Hemorrhage 10/22/2017    . Coronary artery disease involving native coronary artery of native heart without angina pectoris 10/22/2017  . Acute hypoxemic respiratory failure ([place]) 07/17/2017  . Aspiration syndrome, subsequent encounter 07/11/2017  . Aspiration pneumonia ([place]) 07/01/2017  . Acute encephalopathy 07/01/2017  . Thrombocytopenia ([place]) 07/01/2017  . Chronic diastolic (congestive) heart failure ([place]) 07/01/2017  . Anemia of chronic disease 07/01/2017  . Elevated troponin 07/01/2017  . Chronic atrial fibrillation 07/01/2017  . Goals of care, counseling/discussion   . Palliative care by specialist   . Acute metabolic encephalopathy 33/54/5625  . Stage 3a chronic kidney disease   . CAP (community acquired pneumonia) 04/17/2017  . Hallux rigidus, right foot 03/10/2016  . Lumbosacral spondylosis without myelopathy 10/29/2015  . Memory difficulty 09/22/2015  . Abnormality of gait 09/22/2015  . Hyperglycemia 07/06/2015  . Chronic pain 07/06/2015  . Chronic insomnia 03/30/2015  . Transient alteration of awareness 03/30/2015  . Abnormal liver function   . Altered mental status 01/31/2015  . Essential hypertension 01/31/2015  . Constipation 01/31/2015  . Hypothyroidism 01/31/2015  . Seizure disorder ([place]) 01/31/2015  . Bladder outlet obstruction 01/31/2015  . GERD (gastroesophageal reflux disease) 01/31/2015  . Chronic back pain 01/31/2015  . Acute kidney injury ([place]) 01/31/2015   Past Medical History:  Diagnosis Date  . Abnormality of gait 09/22/2015  . Arthritis   . Atrial fibrillation ([place])   . CAD (coronary artery disease)    Stent to RCA, Penta stent, 99% reduced to 0% 2002.  . Cancer ([place])    skin CA removed from back  . 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 Chronic insomnia 03/30/2015  . Complication of anesthesia    trouble waking up  . GERD (gastroesophageal reflux disease)   . Hypercholesteremia   . Hypertension   . Hypothyroidism   . Memory difficulty  09/22/2015  . Osteoarthritis   . Pneumonia   . Seizures ([place])   . Sepsis ([place]) 05/2017  . Transient alteration of awareness 03/30/2015  . Vertigo    hx of    Family History  Problem Relation Age of Onset  . Hypertension Mother   . Cancer Mother   . Kidney failure Father   . Heart disease Father     Past Surgical History:  Procedure Laterality Date  . AMPUTATION Right 03/28/2018   Procedure: AMPUTATION BELOW KNEE;  Surgeon: Newt Minion, MD;  Location: [facility];  Service: Orthopedics;  Laterality: Right;  . APPLICATION OF WOUND VAC Right 01/23/2019   Procedure: Application Of  Prevena Wound Vac;  Surgeon: Newt Minion, MD;  Location: [facility];  Service: Orthopedics;  Laterality: Right;  . BACK SURGERY    . BIOPSY  01/08/2020   Procedure: BIOPSY;  Surgeon: Gatha Mayer, MD;  Location: [facility];  Service: Endoscopy;;  . ESOPHAGOGASTRODUODENOSCOPY (EGD) WITH PROPOFOL N/A 01/08/2020   Procedure: ESOPHAGOGASTRODUODENOSCOPY (EGD) WITH PROPOFOL;  Surgeon: Gatha Mayer, MD;  Location: [facility];  Service: Endoscopy;  Laterality: N/A;  . EYE SURGERY     Bilateral Cataract surgery   . HERNIA REPAIR    . I & D EXTREMITY Right 05/10/2015   Procedure: IRRIGATION AND DEBRIDEMENT EXTREMITY;  Surgeon: Leanora Cover, MD;  Location: [facility];  Service: Orthopedics;  Laterality: Right;  . KNEE ARTHROPLASTY     right knee X 2; left knee once  . LAMINECTOMY     X 6  . LEG AMPUTATION BELOW KNEE Right 03/28/2018  . POSTERIOR CERVICAL FUSION/FORAMINOTOMY  01/28/2012   Procedure: POSTERIOR CERVICAL FUSION/FORAMINOTOMY LEVEL 3;  Surgeon: [name], MD;  Location: [facility];  Service: Neurosurgery;  Laterality: Left;  Posterior Cervical Five-Thoracic One Fusion, Arthrodesis with LEFT Cervical Seven-thoracic One Laminectomy, Foraminotomy and Resection of Synovial Cyst  . POSTERIOR CERVICAL FUSION/FORAMINOTOMY N/A 01/29/2013   Procedure: POSTERIOR CERVICAL  FUSION/FORAMINOTOMY LEVEL 1 and C2-5 Posteriolateral Arthrodesis;  Surgeon: [name], MD;  Location: [facility];  Service: Neurosurgery;  Laterality: N/A;  C2-C3 Laminectomy C2-C3 posterior cervical arthrodesis  . STUMP REVISION Right 01/23/2019   Procedure: REVISION RIGHT BELOW KNEE AMPUTATION;  Surgeon: Newt Minion, MD;  Location: [facility];  Service: Orthopedics;  Laterality: Right;  . TONSILLECTOMY     Social History   Occupational History  . Occupation: Retired Software engineer  Tobacco Use  . Smoking status: Former Smoker    Packs/day: 1.00    Years: 5.00    Pack years: 5.00  . Smokeless tobacco: Never Used  Vaping Use  . Vaping Use: Never used  Substance and Sexual Activity  . Alcohol use: Not Currently    Comment: rare  . Drug use: No  . Sexual activity: Not Currently

## 2020-01-22 NOTE — Telephone Encounter (Signed)
Please see message below. Pts wife had advised several days ago that he was having a burning in his chest and they thought it was due to doxycycline. Advised to call pcp the doctor's office has not yet called back and the pt has not taken since phone call several days ago.

## 2020-01-25 ENCOUNTER — Ambulatory Visit (INDEPENDENT_AMBULATORY_CARE_PROVIDER_SITE_OTHER): Payer: Medicare Other | Admitting: Orthopedic Surgery

## 2020-01-25 ENCOUNTER — Encounter: Payer: Self-pay | Admitting: Orthopedic Surgery

## 2020-01-25 DIAGNOSIS — Z89511 Acquired absence of right leg below knee: Secondary | ICD-10-CM

## 2020-01-25 DIAGNOSIS — I2510 Atherosclerotic heart disease of native coronary artery without angina pectoris: Secondary | ICD-10-CM

## 2020-01-25 DIAGNOSIS — T8781 Dehiscence of amputation stump: Secondary | ICD-10-CM | POA: Diagnosis not present

## 2020-01-25 NOTE — Progress Notes (Signed)
Office Visit Note   Patient: [name]           Date of Birth: October 09, 1933           MRN: [id number] Visit Date: 01/25/2020              Requested by: [name], [place],  [place] [address] PCP: [name], MD  Chief Complaint  Patient presents with  . Right Knee - Pain      HPI: Patient is an 86 year old gentleman status post wound dehiscence from a right transtibial amputation.  Patient states he had GI upset from the antibiotic and has stopped taking it.  Patient states he has pain at times is currently wearing a 2 extra-large stump shrinker  Assessment & Plan: Visit Diagnoses:  1. Dehiscence of amputation stump ([place])   2. S/P BKA (below knee amputation) unilateral, right ([place])     Plan: Continue with soap and water cleansing continue with lotion for the dry skin continue wearing the stump shrinker around-the-clock.  Follow-Up Instructions: Return in about 2 weeks (around 02/08/2020).   Ortho Exam  Patient is alert, oriented, no adenopathy, well-dressed, normal affect, normal respiratory effort. Examination there is no redness no cellulitis no drainage no signs of infection.  The leg is nontender to palpation it is cool to the touch.  Patient has a scab over the previous ulcer on the fibular head and a scab over the distal ulcer both of these scabs are about a centimeter in diameter.  Imaging: No results found. No images are attached to the encounter.  Labs: Lab Results  Component Value Date   HGBA1C 5.7 (H) 03/26/2018   HGBA1C 5.0 07/12/2017   HGBA1C 5.8 (H) 07/06/2015   ESRSEDRATE 24 (H) 03/24/2018   ESRSEDRATE 15 04/18/2017   ESRSEDRATE 5 09/17/2007   CRP 3.1 (H) 05/11/2018   CRP 1.8 (H) 03/24/2018   CRP <0.8 04/24/2017   LABURIC 6.0 03/09/2016   REPTSTATUS 07/12/2019 FINAL 07/10/2019   GRAMSTAIN  07/15/2017    WBC PRESENT,BOTH PMN AND MONONUCLEAR NO ORGANISMS SEEN Performed at [facility], [place] [address]  [place], [place] [address]    CULT >=100,000 COLONIES/mL ESCHERICHIA COLI (A) 07/10/2019   LABORGA ESCHERICHIA COLI (A) 07/10/2019     Lab Results  Component Value Date   ALBUMIN 3.2 (L) 01/07/2020   ALBUMIN 3.6 10/16/2019   ALBUMIN 3.6 12/20/2018   PREALBUMIN 36 (H) 11/22/2017   PREALBUMIN 28.8 04/24/2017   LABURIC 6.0 03/09/2016    Lab Results  Component Value Date   MG 2.1 01/07/2020   MG 2.0 05/12/2018   MG 1.9 05/11/2018   No results found for: VD25OH  Lab Results  Component Value Date   PREALBUMIN 36 (H) 11/22/2017   PREALBUMIN 28.8 04/24/2017   CBC EXTENDED Latest Ref Rng & Units 01/11/2020 01/10/2020 01/09/2020  WBC 4.0 - 10.5 K/uL 8.5 7.6 7.2  RBC 4.22 - 5.81 MIL/uL 3.12(L) 2.33(L) 2.58(L)  HGB 13.0 - 17.0 g/dL 9.8(L) 7.3(L) 8.5(L)  HCT 39 - 52 % 30.6(L) 23.7(L) 26.9(L)  PLT 150 - 400 K/uL 129(L) 135(L) 136(L)  NEUTROABS 1.7 - 7.7 K/uL 6.4 6.1 -  LYMPHSABS 0.7 - 4.0 K/uL 0.9 0.6(L) -     There is no height or weight on file to calculate BMI.  Orders:  No orders of the defined types were placed in this encounter.  No orders of the defined types were placed in this encounter.  Procedures: No procedures performed  Clinical Data: No additional findings.  ROS:  All other systems negative, except as noted in the HPI. Review of Systems  Objective: Vital Signs: There were no vitals taken for this visit.  Specialty Comments:  No specialty comments available.  PMFS History: Patient Active Problem List   Diagnosis Date Noted  . Acute blood loss anemia   . Atypical chest pain   . Chronic duodenal ulcer with hemorrhage   . Gastrointestinal hemorrhage 01/07/2020  . Decreased hemoglobin   . Permanent atrial fibrillation ([place]) 10/24/2019  . Hypercholesterolemia 10/24/2019  . Current chronic use of systemic steroids 10/24/2019  . Macrocytic anemia 10/24/2019  . Dehiscence of amputation stump ([place])   . Wound infection 05/09/2018  . AKI (acute kidney  injury) ([place])   . S/P BKA (below knee amputation) unilateral, right ([place])   . Below-knee amputation with complication, initial encounter ([place])   . Tachypnea   . Post-operative pain   . Subacute osteomyelitis, right ankle and foot ([place])   . Paralysis of right lower extremity ([place])   . TIA (transient ischemic attack) 03/24/2018  . Encephalopathy 03/24/2018  . Cellulitis 03/15/2018  . Hypernatremia 03/15/2018  . Persistent atrial fibrillation ([place]) 11/10/2017  . Severe muscle deconditioning 11/10/2017  . Hemorrhage 10/22/2017  . Coronary artery disease involving native coronary artery of native heart without angina pectoris 10/22/2017  . Acute hypoxemic respiratory failure ([place]) 07/17/2017  . Aspiration syndrome, subsequent encounter 07/11/2017  . Aspiration pneumonia ([place]) 07/01/2017  . Acute encephalopathy 07/01/2017  . Thrombocytopenia ([place]) 07/01/2017  . Chronic diastolic (congestive) heart failure ([place]) 07/01/2017  . Anemia of chronic disease 07/01/2017  . Elevated troponin 07/01/2017  . Chronic atrial fibrillation 07/01/2017  . Goals of care, counseling/discussion   . Palliative care by specialist   . Acute metabolic encephalopathy 76/54/6503  . Stage 3a chronic kidney disease   . CAP (community acquired pneumonia) 04/17/2017  . Hallux rigidus, right foot 03/10/2016  . Lumbosacral spondylosis without myelopathy 10/29/2015  . Memory difficulty 09/22/2015  . Abnormality of gait 09/22/2015  . Hyperglycemia 07/06/2015  . Chronic pain 07/06/2015  . Chronic insomnia 03/30/2015  . Transient alteration of awareness 03/30/2015  . Abnormal liver function   . Altered mental status 01/31/2015  . Essential hypertension 01/31/2015  . Constipation 01/31/2015  . Hypothyroidism 01/31/2015  . Seizure disorder ([place]) 01/31/2015  . Bladder outlet obstruction 01/31/2015  . GERD (gastroesophageal reflux disease) 01/31/2015  . Chronic back pain 01/31/2015  . Acute kidney injury ([place])  01/31/2015   Past Medical History:  Diagnosis Date  . Abnormality of gait 09/22/2015  . Arthritis   . Atrial fibrillation ([place])   . CAD (coronary artery disease)    Stent to RCA, Penta stent, 99% reduced to 0% 2002.  . Cancer ([place])    skin CA removed from back  . 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 Chronic insomnia 03/30/2015  . Complication of anesthesia    trouble waking up  . GERD (gastroesophageal reflux disease)   . Hypercholesteremia   . Hypertension   . Hypothyroidism   . Memory difficulty 09/22/2015  . Osteoarthritis   . Pneumonia   . Seizures ([place])   . Sepsis ([place]) 05/2017  . Transient alteration of awareness 03/30/2015  . Vertigo    hx of    Family History  Problem Relation Age of Onset  . Hypertension Mother   . Cancer Mother   . Kidney failure Father   .  Heart disease Father     Past Surgical History:  Procedure Laterality Date  . AMPUTATION Right 03/28/2018   Procedure: AMPUTATION BELOW KNEE;  Surgeon: Newt Minion, MD;  Location: [facility];  Service: Orthopedics;  Laterality: Right;  . APPLICATION OF WOUND VAC Right 01/23/2019   Procedure: Application Of  Prevena Wound Vac;  Surgeon: Newt Minion, MD;  Location: [facility];  Service: Orthopedics;  Laterality: Right;  . BACK SURGERY    . BIOPSY  01/08/2020   Procedure: BIOPSY;  Surgeon: Gatha Mayer, MD;  Location: [facility];  Service: Endoscopy;;  . ESOPHAGOGASTRODUODENOSCOPY (EGD) WITH PROPOFOL N/A 01/08/2020   Procedure: ESOPHAGOGASTRODUODENOSCOPY (EGD) WITH PROPOFOL;  Surgeon: Gatha Mayer, MD;  Location: [facility];  Service: Endoscopy;  Laterality: N/A;  . EYE SURGERY     Bilateral Cataract surgery   . HERNIA REPAIR    . I & D EXTREMITY Right 05/10/2015   Procedure: IRRIGATION AND DEBRIDEMENT EXTREMITY;  Surgeon: Leanora Cover, MD;  Location: [facility];  Service: Orthopedics;  Laterality: Right;  . KNEE ARTHROPLASTY     right knee X 2; left knee once  .  LAMINECTOMY     X 6  . LEG AMPUTATION BELOW KNEE Right 03/28/2018  . POSTERIOR CERVICAL FUSION/FORAMINOTOMY  01/28/2012   Procedure: POSTERIOR CERVICAL FUSION/FORAMINOTOMY LEVEL 3;  Surgeon: [name], MD;  Location: [facility];  Service: Neurosurgery;  Laterality: Left;  Posterior Cervical Five-Thoracic One Fusion, Arthrodesis with LEFT Cervical Seven-thoracic One Laminectomy, Foraminotomy and Resection of Synovial Cyst  . POSTERIOR CERVICAL FUSION/FORAMINOTOMY N/A 01/29/2013   Procedure: POSTERIOR CERVICAL FUSION/FORAMINOTOMY LEVEL 1 and C2-5 Posteriolateral Arthrodesis;  Surgeon: [name], MD;  Location: [facility];  Service: Neurosurgery;  Laterality: N/A;  C2-C3 Laminectomy C2-C3 posterior cervical arthrodesis  . STUMP REVISION Right 01/23/2019   Procedure: REVISION RIGHT BELOW KNEE AMPUTATION;  Surgeon: Newt Minion, MD;  Location: [facility];  Service: Orthopedics;  Laterality: Right;  . TONSILLECTOMY     Social History   Occupational History  . Occupation: Retired Software engineer  Tobacco Use  . Smoking status: Former Smoker    Packs/day: 1.00    Years: 5.00    Pack years: 5.00  . Smokeless tobacco: Never Used  Vaping Use  . Vaping Use: Never used  Substance and Sexual Activity  . Alcohol use: Not Currently    Comment: rare  . Drug use: No  . Sexual activity: Not Currently

## 2020-01-28 ENCOUNTER — Ambulatory Visit: Payer: Medicare Other | Admitting: Orthopedic Surgery

## 2020-02-02 ENCOUNTER — Telehealth: Payer: Self-pay | Admitting: *Deleted

## 2020-02-02 NOTE — Telephone Encounter (Signed)
[name] is needing verbal orders for wound care where a scab on R knee had been taken off last night by compressio9n stocking and wants to use Calcium ag(?) with silver for X3/week. Please advise.   CB [phone]

## 2020-02-03 NOTE — Telephone Encounter (Signed)
HHN advised verbal ok for orders below.

## 2020-02-08 ENCOUNTER — Encounter: Payer: Self-pay | Admitting: Orthopedic Surgery

## 2020-02-08 ENCOUNTER — Ambulatory Visit (INDEPENDENT_AMBULATORY_CARE_PROVIDER_SITE_OTHER): Payer: Medicare Other | Admitting: Orthopedic Surgery

## 2020-02-08 VITALS — Ht 69.0 in | Wt 155.0 lb

## 2020-02-08 DIAGNOSIS — T8781 Dehiscence of amputation stump: Secondary | ICD-10-CM

## 2020-02-08 DIAGNOSIS — I2510 Atherosclerotic heart disease of native coronary artery without angina pectoris: Secondary | ICD-10-CM | POA: Diagnosis not present

## 2020-02-22 ENCOUNTER — Ambulatory Visit (INDEPENDENT_AMBULATORY_CARE_PROVIDER_SITE_OTHER): Payer: Medicare Other | Admitting: Physician Assistant

## 2020-02-22 ENCOUNTER — Encounter: Payer: Self-pay | Admitting: Orthopedic Surgery

## 2020-02-22 VITALS — Ht 69.0 in | Wt 155.0 lb

## 2020-02-22 DIAGNOSIS — Z89511 Acquired absence of right leg below knee: Secondary | ICD-10-CM

## 2020-02-22 NOTE — Progress Notes (Signed)
Office Visit Note   Patient: [name]           Date of Birth: 05/08/1933           MRN: [id number] Visit Date: 02/22/2020              Requested by: [name], [place],  [place] [address] PCP: [name], MD  Chief Complaint  Patient presents with  . Right Leg - Follow-up    BKA      HPI: This is a pleasant 86 year old gentleman who comes in for follow-up for his below-knee amputation stump.  His wife is been doing every other day dressing changes.  He is also been cleansing this on a regular basis.  Assessment & Plan: Visit Diagnoses: No diagnosis found.  Plan: Continues to have areas of skin breakdown but nothing worse or acute today.  Emphasized that she should perhaps just cleanse it every other day because the skin is becoming quite dry.  Continue to apply the shrinker.  Follow-up in 2 weeks.  Follow-Up Instructions: No follow-ups on file.   Ortho Exam  Patient is alert, oriented, no adenopathy, well-dressed, normal affect, normal respiratory effort. Focused examination demonstrates below-knee amputation stump without any cellulitis he does have various areas of scabbing and eschar and skin breakdown.  There is no adjacent cellulitis.  There is no foul odor.  Imaging: No results found. No images are attached to the encounter.  Labs: Lab Results  Component Value Date   HGBA1C 5.7 (H) 03/26/2018   HGBA1C 5.0 07/12/2017   HGBA1C 5.8 (H) 07/06/2015   ESRSEDRATE 24 (H) 03/24/2018   ESRSEDRATE 15 04/18/2017   ESRSEDRATE 5 09/17/2007   CRP 3.1 (H) 05/11/2018   CRP 1.8 (H) 03/24/2018   CRP <0.8 04/24/2017   LABURIC 6.0 03/09/2016   REPTSTATUS 07/12/2019 FINAL 07/10/2019   GRAMSTAIN  07/15/2017    WBC PRESENT,BOTH PMN AND MONONUCLEAR NO ORGANISMS SEEN Performed at [facility], [place] [address] [place], [place] [address]    CULT >=100,000 COLONIES/mL ESCHERICHIA COLI (A) 07/10/2019   LABORGA ESCHERICHIA COLI (A)  07/10/2019     Lab Results  Component Value Date   ALBUMIN 3.2 (L) 01/07/2020   ALBUMIN 3.6 10/16/2019   ALBUMIN 3.6 12/20/2018   PREALBUMIN 36 (H) 11/22/2017   PREALBUMIN 28.8 04/24/2017   LABURIC 6.0 03/09/2016    Lab Results  Component Value Date   MG 2.1 01/07/2020   MG 2.0 05/12/2018   MG 1.9 05/11/2018   No results found for: VD25OH  Lab Results  Component Value Date   PREALBUMIN 36 (H) 11/22/2017   PREALBUMIN 28.8 04/24/2017   CBC EXTENDED Latest Ref Rng & Units 01/11/2020 01/10/2020 01/09/2020  WBC 4.0 - 10.5 K/uL 8.5 7.6 7.2  RBC 4.22 - 5.81 MIL/uL 3.12(L) 2.33(L) 2.58(L)  HGB 13.0 - 17.0 g/dL 9.8(L) 7.3(L) 8.5(L)  HCT 39.0 - 52.0 % 30.6(L) 23.7(L) 26.9(L)  PLT 150 - 400 K/uL 129(L) 135(L) 136(L)  NEUTROABS 1.7 - 7.7 K/uL 6.4 6.1 -  LYMPHSABS 0.7 - 4.0 K/uL 0.9 0.6(L) -     Body mass index is 22.89 kg/m.  Orders:  No orders of the defined types were placed in this encounter.  No orders of the defined types were placed in this encounter.    Procedures: No procedures performed  Clinical Data: No additional findings.  ROS:  All other systems negative, except as noted in the HPI. Review of Systems  Objective: Vital Signs: Ht 5\' 9"  (1.753 m)   Wt 155 lb (70.3 kg)   BMI 22.89 kg/m   Specialty Comments:  No specialty comments available.  PMFS History: Patient Active Problem List   Diagnosis Date Noted  . Acute blood loss anemia   . Atypical chest pain   . Chronic duodenal ulcer with hemorrhage   . Gastrointestinal hemorrhage 01/07/2020  . Decreased hemoglobin   . Permanent atrial fibrillation ([place]) 10/24/2019  . Hypercholesterolemia 10/24/2019  . Current chronic use of systemic steroids 10/24/2019  . Macrocytic anemia 10/24/2019  . Dehiscence of amputation stump ([place])   . Wound infection 05/09/2018  . AKI (acute kidney injury) ([place])   . S/P BKA (below knee amputation) unilateral, right ([place])   . Below-knee amputation with  complication, initial encounter ([place])   . Tachypnea   . Post-operative pain   . Subacute osteomyelitis, right ankle and foot ([place])   . Paralysis of right lower extremity ([place])   . TIA (transient ischemic attack) 03/24/2018  . Encephalopathy 03/24/2018  . Cellulitis 03/15/2018  . Hypernatremia 03/15/2018  . Persistent atrial fibrillation ([place]) 11/10/2017  . Severe muscle deconditioning 11/10/2017  . Hemorrhage 10/22/2017  . Coronary artery disease involving native coronary artery of native heart without angina pectoris 10/22/2017  . Acute hypoxemic respiratory failure ([place]) 07/17/2017  . Aspiration syndrome, subsequent encounter 07/11/2017  . Aspiration pneumonia ([place]) 07/01/2017  . Acute encephalopathy 07/01/2017  . Thrombocytopenia ([place]) 07/01/2017  . Chronic diastolic (congestive) heart failure ([place]) 07/01/2017  . Anemia of chronic disease 07/01/2017  . Elevated troponin 07/01/2017  . Chronic atrial fibrillation 07/01/2017  . Goals of care, counseling/discussion   . Palliative care by specialist   . Acute metabolic encephalopathy 96/28/3662  . Stage 3a chronic kidney disease   . CAP (community acquired pneumonia) 04/17/2017  . Hallux rigidus, right foot 03/10/2016  . Lumbosacral spondylosis without myelopathy 10/29/2015  . Memory difficulty 09/22/2015  . Abnormality of gait 09/22/2015  . Hyperglycemia 07/06/2015  . Chronic pain 07/06/2015  . Chronic insomnia 03/30/2015  . Transient alteration of awareness 03/30/2015  . Abnormal liver function   . Altered mental status 01/31/2015  . Essential hypertension 01/31/2015  . Constipation 01/31/2015  . Hypothyroidism 01/31/2015  . Seizure disorder ([place]) 01/31/2015  . Bladder outlet obstruction 01/31/2015  . GERD (gastroesophageal reflux disease) 01/31/2015  . Chronic back pain 01/31/2015  . Acute kidney injury ([place]) 01/31/2015   Past Medical History:  Diagnosis Date  . Abnormality of gait 09/22/2015  . Arthritis   . Atrial  fibrillation ([place])   . CAD (coronary artery disease)    Stent to RCA, Penta stent, 99% reduced to 0% 2002.  . Cancer ([place])    skin CA removed from back  . 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 Chronic insomnia 03/30/2015  . Complication of anesthesia    trouble waking up  . GERD (gastroesophageal reflux disease)   . Hypercholesteremia   . Hypertension   . Hypothyroidism   . Memory difficulty 09/22/2015  . Osteoarthritis   . Pneumonia   . Seizures ([place])   . Sepsis ([place]) 05/2017  . Transient alteration of awareness 03/30/2015  . Vertigo    hx of    Family History  Problem Relation Age of Onset  . Hypertension Mother   . Cancer Mother   . Kidney failure Father   . Heart disease Father     Past Surgical History:  Procedure Laterality Date  .  AMPUTATION Right 03/28/2018   Procedure: AMPUTATION BELOW KNEE;  Surgeon: Newt Minion, MD;  Location: [facility];  Service: Orthopedics;  Laterality: Right;  . APPLICATION OF WOUND VAC Right 01/23/2019   Procedure: Application Of  Prevena Wound Vac;  Surgeon: Newt Minion, MD;  Location: [facility];  Service: Orthopedics;  Laterality: Right;  . BACK SURGERY    . BIOPSY  01/08/2020   Procedure: BIOPSY;  Surgeon: Gatha Mayer, MD;  Location: [facility];  Service: Endoscopy;;  . ESOPHAGOGASTRODUODENOSCOPY (EGD) WITH PROPOFOL N/A 01/08/2020   Procedure: ESOPHAGOGASTRODUODENOSCOPY (EGD) WITH PROPOFOL;  Surgeon: Gatha Mayer, MD;  Location: [facility];  Service: Endoscopy;  Laterality: N/A;  . EYE SURGERY     Bilateral Cataract surgery   . HERNIA REPAIR    . I & D EXTREMITY Right 05/10/2015   Procedure: IRRIGATION AND DEBRIDEMENT EXTREMITY;  Surgeon: Leanora Cover, MD;  Location: [facility];  Service: Orthopedics;  Laterality: Right;  . KNEE ARTHROPLASTY     right knee X 2; left knee once  . LAMINECTOMY     X 6  . LEG AMPUTATION BELOW KNEE Right 03/28/2018  . POSTERIOR CERVICAL FUSION/FORAMINOTOMY   01/28/2012   Procedure: POSTERIOR CERVICAL FUSION/FORAMINOTOMY LEVEL 3;  Surgeon: [name], MD;  Location: [facility];  Service: Neurosurgery;  Laterality: Left;  Posterior Cervical Five-Thoracic One Fusion, Arthrodesis with LEFT Cervical Seven-thoracic One Laminectomy, Foraminotomy and Resection of Synovial Cyst  . POSTERIOR CERVICAL FUSION/FORAMINOTOMY N/A 01/29/2013   Procedure: POSTERIOR CERVICAL FUSION/FORAMINOTOMY LEVEL 1 and C2-5 Posteriolateral Arthrodesis;  Surgeon: [name], MD;  Location: [facility];  Service: Neurosurgery;  Laterality: N/A;  C2-C3 Laminectomy C2-C3 posterior cervical arthrodesis  . STUMP REVISION Right 01/23/2019   Procedure: REVISION RIGHT BELOW KNEE AMPUTATION;  Surgeon: Newt Minion, MD;  Location: [facility];  Service: Orthopedics;  Laterality: Right;  . TONSILLECTOMY     Social History   Occupational History  . Occupation: Retired Software engineer  Tobacco Use  . Smoking status: Former Smoker    Packs/day: 1.00    Years: 5.00    Pack years: 5.00  . Smokeless tobacco: Never Used  Vaping Use  . Vaping Use: Never used  Substance and Sexual Activity  . Alcohol use: Not Currently    Comment: rare  . Drug use: No  . Sexual activity: Not Currently

## 2020-02-22 NOTE — Progress Notes (Signed)
Office Visit Note   Patient: [name]           Date of Birth: Feb 20, 1934           MRN: [id number] Visit Date: 02/08/2020              Requested by: [name], [place],  [place] [address] PCP: [name], MD  Chief Complaint  Patient presents with  . Right Leg - Follow-up      HPI: Patient is an 86 year old gentleman who presents in follow-up for dehiscence of amputated stump on the right.  Patient states that he has developed some new abrasions to the residual limb.  Patient states these popped up while he was recently hospitalized.  Assessment & Plan: Visit Diagnoses:  1. Dehiscence of amputation stump (HCC)     Plan: The ulcers appear to be drying out the stump shrinker was applied recommend they change this every other day.  Follow-Up Instructions: Return in about 2 weeks (around 02/22/2020).   Ortho Exam  Patient is alert, oriented, no adenopathy, well-dressed, normal affect, normal respiratory effort. Examination patient has a new abrasion over the lateral aspect of the right leg.  This appears to be abrasions from his wheelchair.  The ulcers are improving there is no cellulitis.  Imaging: No results found. No images are attached to the encounter.  Labs: Lab Results  Component Value Date   HGBA1C 5.7 (H) 03/26/2018   HGBA1C 5.0 07/12/2017   HGBA1C 5.8 (H) 07/06/2015   ESRSEDRATE 24 (H) 03/24/2018   ESRSEDRATE 15 04/18/2017   ESRSEDRATE 5 09/17/2007   CRP 3.1 (H) 05/11/2018   CRP 1.8 (H) 03/24/2018   CRP <0.8 04/24/2017   LABURIC 6.0 03/09/2016   REPTSTATUS 07/12/2019 FINAL 07/10/2019   GRAMSTAIN  07/15/2017    WBC PRESENT,BOTH PMN AND MONONUCLEAR NO ORGANISMS SEEN Performed at [facility], [place] [address] [place], [place] [address]    CULT >=100,000 COLONIES/mL ESCHERICHIA COLI (A) 07/10/2019   LABORGA ESCHERICHIA COLI (A) 07/10/2019     Lab Results  Component Value Date   ALBUMIN 3.2 (L) 01/07/2020    ALBUMIN 3.6 10/16/2019   ALBUMIN 3.6 12/20/2018   PREALBUMIN 36 (H) 11/22/2017   PREALBUMIN 28.8 04/24/2017   LABURIC 6.0 03/09/2016    Lab Results  Component Value Date   MG 2.1 01/07/2020   MG 2.0 05/12/2018   MG 1.9 05/11/2018   No results found for: VD25OH  Lab Results  Component Value Date   PREALBUMIN 36 (H) 11/22/2017   PREALBUMIN 28.8 04/24/2017   CBC EXTENDED Latest Ref Rng & Units 01/11/2020 01/10/2020 01/09/2020  WBC 4.0 - 10.5 K/uL 8.5 7.6 7.2  RBC 4.22 - 5.81 MIL/uL 3.12(L) 2.33(L) 2.58(L)  HGB 13.0 - 17.0 g/dL 9.8(L) 7.3(L) 8.5(L)  HCT 39.0 - 52.0 % 30.6(L) 23.7(L) 26.9(L)  PLT 150 - 400 K/uL 129(L) 135(L) 136(L)  NEUTROABS 1.7 - 7.7 K/uL 6.4 6.1 -  LYMPHSABS 0.7 - 4.0 K/uL 0.9 0.6(L) -     Body mass index is 22.89 kg/m.  Orders:  No orders of the defined types were placed in this encounter.  No orders of the defined types were placed in this encounter.    Procedures: No procedures performed  Clinical Data: No additional findings.  ROS:  All other systems negative, except as noted in the HPI. Review of Systems  Objective: Vital Signs: Ht 5\' 9"  (1.753 m)   Wt 155 lb (70.3 kg)  BMI 22.89 kg/m   Specialty Comments:  No specialty comments available.  PMFS History: Patient Active Problem List   Diagnosis Date Noted  . Acute blood loss anemia   . Atypical chest pain   . Chronic duodenal ulcer with hemorrhage   . Gastrointestinal hemorrhage 01/07/2020  . Decreased hemoglobin   . Permanent atrial fibrillation ([place]) 10/24/2019  . Hypercholesterolemia 10/24/2019  . Current chronic use of systemic steroids 10/24/2019  . Macrocytic anemia 10/24/2019  . Dehiscence of amputation stump ([place])   . Wound infection 05/09/2018  . AKI (acute kidney injury) ([place])   . S/P BKA (below knee amputation) unilateral, right ([place])   . Below-knee amputation with complication, initial encounter ([place])   . Tachypnea   . Post-operative pain   . Subacute  osteomyelitis, right ankle and foot ([place])   . Paralysis of right lower extremity ([place])   . TIA (transient ischemic attack) 03/24/2018  . Encephalopathy 03/24/2018  . Cellulitis 03/15/2018  . Hypernatremia 03/15/2018  . Persistent atrial fibrillation ([place]) 11/10/2017  . Severe muscle deconditioning 11/10/2017  . Hemorrhage 10/22/2017  . Coronary artery disease involving native coronary artery of native heart without angina pectoris 10/22/2017  . Acute hypoxemic respiratory failure ([place]) 07/17/2017  . Aspiration syndrome, subsequent encounter 07/11/2017  . Aspiration pneumonia ([place]) 07/01/2017  . Acute encephalopathy 07/01/2017  . Thrombocytopenia ([place]) 07/01/2017  . Chronic diastolic (congestive) heart failure ([place]) 07/01/2017  . Anemia of chronic disease 07/01/2017  . Elevated troponin 07/01/2017  . Chronic atrial fibrillation 07/01/2017  . Goals of care, counseling/discussion   . Palliative care by specialist   . Acute metabolic encephalopathy 23/55/7322  . Stage 3a chronic kidney disease   . CAP (community acquired pneumonia) 04/17/2017  . Hallux rigidus, right foot 03/10/2016  . Lumbosacral spondylosis without myelopathy 10/29/2015  . Memory difficulty 09/22/2015  . Abnormality of gait 09/22/2015  . Hyperglycemia 07/06/2015  . Chronic pain 07/06/2015  . Chronic insomnia 03/30/2015  . Transient alteration of awareness 03/30/2015  . Abnormal liver function   . Altered mental status 01/31/2015  . Essential hypertension 01/31/2015  . Constipation 01/31/2015  . Hypothyroidism 01/31/2015  . Seizure disorder ([place]) 01/31/2015  . Bladder outlet obstruction 01/31/2015  . GERD (gastroesophageal reflux disease) 01/31/2015  . Chronic back pain 01/31/2015  . Acute kidney injury ([place]) 01/31/2015   Past Medical History:  Diagnosis Date  . Abnormality of gait 09/22/2015  . Arthritis   . Atrial fibrillation ([place])   . CAD (coronary artery disease)    Stent to RCA, Penta stent, 99%  reduced to 0% 2002.  . Cancer ([place])    skin CA removed from back  . 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 Chronic insomnia 03/30/2015  . Complication of anesthesia    trouble waking up  . GERD (gastroesophageal reflux disease)   . Hypercholesteremia   . Hypertension   . Hypothyroidism   . Memory difficulty 09/22/2015  . Osteoarthritis   . Pneumonia   . Seizures ([place])   . Sepsis ([place]) 05/2017  . Transient alteration of awareness 03/30/2015  . Vertigo    hx of    Family History  Problem Relation Age of Onset  . Hypertension Mother   . Cancer Mother   . Kidney failure Father   . Heart disease Father     Past Surgical History:  Procedure Laterality Date  . AMPUTATION Right 03/28/2018   Procedure: AMPUTATION BELOW KNEE;  Surgeon: Newt Minion, MD;  Location: [facility];  Service: Orthopedics;  Laterality: Right;  . APPLICATION OF WOUND VAC Right 01/23/2019   Procedure: Application Of  Prevena Wound Vac;  Surgeon: Newt Minion, MD;  Location: [facility];  Service: Orthopedics;  Laterality: Right;  . BACK SURGERY    . BIOPSY  01/08/2020   Procedure: BIOPSY;  Surgeon: Gatha Mayer, MD;  Location: [facility];  Service: Endoscopy;;  . ESOPHAGOGASTRODUODENOSCOPY (EGD) WITH PROPOFOL N/A 01/08/2020   Procedure: ESOPHAGOGASTRODUODENOSCOPY (EGD) WITH PROPOFOL;  Surgeon: Gatha Mayer, MD;  Location: [facility];  Service: Endoscopy;  Laterality: N/A;  . EYE SURGERY     Bilateral Cataract surgery   . HERNIA REPAIR    . I & D EXTREMITY Right 05/10/2015   Procedure: IRRIGATION AND DEBRIDEMENT EXTREMITY;  Surgeon: Leanora Cover, MD;  Location: [facility];  Service: Orthopedics;  Laterality: Right;  . KNEE ARTHROPLASTY     right knee X 2; left knee once  . LAMINECTOMY     X 6  . LEG AMPUTATION BELOW KNEE Right 03/28/2018  . POSTERIOR CERVICAL FUSION/FORAMINOTOMY  01/28/2012   Procedure: POSTERIOR CERVICAL FUSION/FORAMINOTOMY LEVEL 3;  Surgeon: [name], MD;  Location: [facility];  Service: Neurosurgery;  Laterality: Left;  Posterior Cervical Five-Thoracic One Fusion, Arthrodesis with LEFT Cervical Seven-thoracic One Laminectomy, Foraminotomy and Resection of Synovial Cyst  . POSTERIOR CERVICAL FUSION/FORAMINOTOMY N/A 01/29/2013   Procedure: POSTERIOR CERVICAL FUSION/FORAMINOTOMY LEVEL 1 and C2-5 Posteriolateral Arthrodesis;  Surgeon: [name], MD;  Location: [facility];  Service: Neurosurgery;  Laterality: N/A;  C2-C3 Laminectomy C2-C3 posterior cervical arthrodesis  . STUMP REVISION Right 01/23/2019   Procedure: REVISION RIGHT BELOW KNEE AMPUTATION;  Surgeon: Newt Minion, MD;  Location: [facility];  Service: Orthopedics;  Laterality: Right;  . TONSILLECTOMY     Social History   Occupational History  . Occupation: Retired Software engineer  Tobacco Use  . Smoking status: Former Smoker    Packs/day: 1.00    Years: 5.00    Pack years: 5.00  . Smokeless tobacco: Never Used  Vaping Use  . Vaping Use: Never used  Substance and Sexual Activity  . Alcohol use: Not Currently    Comment: rare  . Drug use: No  . Sexual activity: Not Currently

## 2020-02-23 ENCOUNTER — Telehealth: Payer: Self-pay

## 2020-02-23 ENCOUNTER — Other Ambulatory Visit: Payer: Self-pay | Admitting: *Deleted

## 2020-02-23 NOTE — Patient Outreach (Signed)
[facility]) Care Management  02/23/2020  [name] September 21, 1933 [id number]  Successful telephone outreach call to patient's wife [name]. HIPAA identifiers obtained. [name] explained that she would not be able to speak today and asked that this nurse call her back at a later date.  Plan: RN Health Coach will call patient within the month of January.  Emelia Loron RN, BSN [place] [phone] [email or web address]

## 2020-02-23 NOTE — Telephone Encounter (Signed)
Beth with [facility] called stating that patient has a small red hardened area that's non tender on the right mid anterior thigh and a big, red, hard quarter size area on the back of the right thigh.  Stated that if patient needs to be seen, to give his wife a call to schedule.  Beth's CB# [phone].  Please advise.  Thank you.

## 2020-02-24 NOTE — Telephone Encounter (Signed)
Pt on the schedule for tomorrow at 3:15

## 2020-02-25 ENCOUNTER — Ambulatory Visit (INDEPENDENT_AMBULATORY_CARE_PROVIDER_SITE_OTHER): Payer: Medicare Other | Admitting: Orthopedic Surgery

## 2020-02-25 DIAGNOSIS — Z89511 Acquired absence of right leg below knee: Secondary | ICD-10-CM

## 2020-02-25 DIAGNOSIS — T8781 Dehiscence of amputation stump: Secondary | ICD-10-CM | POA: Diagnosis not present

## 2020-02-25 DIAGNOSIS — I2510 Atherosclerotic heart disease of native coronary artery without angina pectoris: Secondary | ICD-10-CM

## 2020-02-25 NOTE — Progress Notes (Signed)
Office Visit Note   Patient: [name]           Date of Birth: 06-Apr-1933           MRN: [id number] Visit Date: 02/25/2020              Requested by: [name], MD [address] [place],  [place] [address] PCP: [name], MD  Chief Complaint  Patient presents with   Right Leg - Follow-up    Hx revision BKA 01/2019      HPI: Patient is an 86 year old gentleman who is status post right transtibial amputation revision approximately a year ago.  Patient states he has some new red lesions on his leg and was concerned for a new infection.  Patient states he has had side effects from taking doxycycline he states this makes him sick.  Assessment & Plan: Visit Diagnoses:  1. Acquired absence of right lower extremity below knee ([place])   2. Dehiscence of amputation stump ([place])     Plan: Orders are called in for Bactroban to be applied to the red lesions 3 times a day and Bactrim DS reevaluate in 2 weeks  Follow-Up Instructions: Return in about 2 weeks (around 03/10/2020).   Ortho Exam  Patient is alert, oriented, no adenopathy, well-dressed, normal affect, normal respiratory effort. Examination patient has small 1 cm area of raised cellulitic lesions on the residual limb 1 is on the dorsal thigh and one is in the popliteal fossa there is no ascending cellulitis there is no drainage.  Imaging: No results found. No images are attached to the encounter.  Labs: Lab Results  Component Value Date   HGBA1C 5.7 (H) 03/26/2018   HGBA1C 5.0 07/12/2017   HGBA1C 5.8 (H) 07/06/2015   ESRSEDRATE 24 (H) 03/24/2018   ESRSEDRATE 15 04/18/2017   ESRSEDRATE 5 09/17/2007   CRP 3.1 (H) 05/11/2018   CRP 1.8 (H) 03/24/2018   CRP <0.8 04/24/2017   LABURIC 6.0 03/09/2016   REPTSTATUS 07/12/2019 FINAL 07/10/2019   GRAMSTAIN  07/15/2017    WBC PRESENT,BOTH PMN AND MONONUCLEAR NO ORGANISMS SEEN Performed at [facility], [place] [address] [place], [place] [address]     CULT >=100,000 COLONIES/mL ESCHERICHIA COLI (A) 07/10/2019   LABORGA ESCHERICHIA COLI (A) 07/10/2019     Lab Results  Component Value Date   ALBUMIN 3.2 (L) 01/07/2020   ALBUMIN 3.6 10/16/2019   ALBUMIN 3.6 12/20/2018   PREALBUMIN 36 (H) 11/22/2017   PREALBUMIN 28.8 04/24/2017   LABURIC 6.0 03/09/2016    Lab Results  Component Value Date   MG 2.1 01/07/2020   MG 2.0 05/12/2018   MG 1.9 05/11/2018   No results found for: VD25OH  Lab Results  Component Value Date   PREALBUMIN 36 (H) 11/22/2017   PREALBUMIN 28.8 04/24/2017   CBC EXTENDED Latest Ref Rng & Units 01/11/2020 01/10/2020 01/09/2020  WBC 4.0 - 10.5 K/uL 8.5 7.6 7.2  RBC 4.22 - 5.81 MIL/uL 3.12(L) 2.33(L) 2.58(L)  HGB 13.0 - 17.0 g/dL 9.8(L) 7.3(L) 8.5(L)  HCT 39.0 - 52.0 % 30.6(L) 23.7(L) 26.9(L)  PLT 150 - 400 K/uL 129(L) 135(L) 136(L)  NEUTROABS 1.7 - 7.7 K/uL 6.4 6.1 -  LYMPHSABS 0.7 - 4.0 K/uL 0.9 0.6(L) -     There is no height or weight on file to calculate BMI.  Orders:  No orders of the defined types were placed in this encounter.  No orders of the defined types were placed in this encounter.  Procedures: No procedures performed  Clinical Data: No additional findings.  ROS:  All other systems negative, except as noted in the HPI. Review of Systems  Objective: Vital Signs: There were no vitals taken for this visit.  Specialty Comments:  No specialty comments available.  PMFS History: Patient Active Problem List   Diagnosis Date Noted   Acute blood loss anemia    Atypical chest pain    Chronic duodenal ulcer with hemorrhage    Gastrointestinal hemorrhage 01/07/2020   Decreased hemoglobin    Permanent atrial fibrillation ([place]) 10/24/2019   Hypercholesterolemia 10/24/2019   Current chronic use of systemic steroids 10/24/2019   Macrocytic anemia 10/24/2019   Dehiscence of amputation stump ([place])    Wound infection 05/09/2018   AKI (acute kidney injury) ([place])    S/P  BKA (below knee amputation) unilateral, right ([place])    Below-knee amputation with complication, initial encounter ([place])    Tachypnea    Post-operative pain    Subacute osteomyelitis, right ankle and foot ([place])    Paralysis of right lower extremity ([place])    TIA (transient ischemic attack) 03/24/2018   Encephalopathy 03/24/2018   Cellulitis 03/15/2018   Hypernatremia 03/15/2018   Persistent atrial fibrillation ([place]) 11/10/2017   Severe muscle deconditioning 11/10/2017   Hemorrhage 10/22/2017   Coronary artery disease involving native coronary artery of native heart without angina pectoris 10/22/2017   Acute hypoxemic respiratory failure ([place]) 07/17/2017   Aspiration syndrome, subsequent encounter 07/11/2017   Aspiration pneumonia ([place]) 07/01/2017   Acute encephalopathy 07/01/2017   Thrombocytopenia ([place]) 07/01/2017   Chronic diastolic (congestive) heart failure ([place]) 07/01/2017   Anemia of chronic disease 07/01/2017   Elevated troponin 07/01/2017   Chronic atrial fibrillation 07/01/2017   Goals of care, counseling/discussion    Palliative care by specialist    Acute metabolic encephalopathy 85/04/7739   Stage 3a chronic kidney disease    CAP (community acquired pneumonia) 04/17/2017   Hallux rigidus, right foot 03/10/2016   Lumbosacral spondylosis without myelopathy 10/29/2015   Memory difficulty 09/22/2015   Abnormality of gait 09/22/2015   Hyperglycemia 07/06/2015   Chronic pain 07/06/2015   Chronic insomnia 03/30/2015   Transient alteration of awareness 03/30/2015   Abnormal liver function    Altered mental status 01/31/2015   Essential hypertension 01/31/2015   Constipation 01/31/2015   Hypothyroidism 01/31/2015   Seizure disorder ([place]) 01/31/2015   Bladder outlet obstruction 01/31/2015   GERD (gastroesophageal reflux disease) 01/31/2015   Chronic back pain 01/31/2015   Acute kidney injury ([place]) 01/31/2015   Past Medical  History:  Diagnosis Date   Abnormality of gait 09/22/2015   Arthritis    Atrial fibrillation ([place])    CAD (coronary artery disease)    Stent to RCA, Penta stent, 99% reduced to 0% 2002.   Cancer ([place])    skin CA removed from back   Chronic duodenal ulcer with hemorrhage 12/2019   meloxicam   Chronic insomnia 2/87/8676   Complication of anesthesia    trouble waking up   GERD (gastroesophageal reflux disease)    Hypercholesteremia    Hypertension    Hypothyroidism    Memory difficulty 09/22/2015   Osteoarthritis    Pneumonia    Seizures ([place])    Sepsis ([place]) 05/2017   Transient alteration of awareness 03/30/2015   Vertigo    hx of    Family History  Problem Relation Age of Onset   Hypertension Mother    Cancer Mother    Kidney failure Father  Heart disease Father     Past Surgical History:  Procedure Laterality Date   AMPUTATION Right 03/28/2018   Procedure: AMPUTATION BELOW KNEE;  Surgeon: Newt Minion, MD;  Location: [place] [facility];  Service: Orthopedics;  Laterality: Right;   APPLICATION OF WOUND VAC Right 01/23/2019   Procedure: Application Of  Prevena Wound Vac;  Surgeon: Newt Minion, MD;  Location: [facility];  Service: Orthopedics;  Laterality: Right;   BACK SURGERY     BIOPSY  01/08/2020   Procedure: BIOPSY;  Surgeon: Gatha Mayer, MD;  Location: [facility];  Service: Endoscopy;;   ESOPHAGOGASTRODUODENOSCOPY (EGD) WITH PROPOFOL N/A 01/08/2020   Procedure: ESOPHAGOGASTRODUODENOSCOPY (EGD) WITH PROPOFOL;  Surgeon: Gatha Mayer, MD;  Location: [facility];  Service: Endoscopy;  Laterality: N/A;   EYE SURGERY     Bilateral Cataract surgery    HERNIA REPAIR     I & D EXTREMITY Right 05/10/2015   Procedure: IRRIGATION AND DEBRIDEMENT EXTREMITY;  Surgeon: Leanora Cover, MD;  Location: [place];  Service: Orthopedics;  Laterality: Right;   KNEE ARTHROPLASTY     right knee X 2; left knee once   LAMINECTOMY     X 6   LEG  AMPUTATION BELOW KNEE Right 03/28/2018   POSTERIOR CERVICAL FUSION/FORAMINOTOMY  01/28/2012   Procedure: POSTERIOR CERVICAL FUSION/FORAMINOTOMY LEVEL 3;  Surgeon: [name], MD;  Location: [facility];  Service: Neurosurgery;  Laterality: Left;  Posterior Cervical Five-Thoracic One Fusion, Arthrodesis with LEFT Cervical Seven-thoracic One Laminectomy, Foraminotomy and Resection of Synovial Cyst   POSTERIOR CERVICAL FUSION/FORAMINOTOMY N/A 01/29/2013   Procedure: POSTERIOR CERVICAL FUSION/FORAMINOTOMY LEVEL 1 and C2-5 Posteriolateral Arthrodesis;  Surgeon: [name], MD;  Location: [facility];  Service: Neurosurgery;  Laterality: N/A;  C2-C3 Laminectomy C2-C3 posterior cervical arthrodesis   STUMP REVISION Right 01/23/2019   Procedure: REVISION RIGHT BELOW KNEE AMPUTATION;  Surgeon: Newt Minion, MD;  Location: [facility];  Service: Orthopedics;  Laterality: Right;   TONSILLECTOMY     Social History   Occupational History   Occupation: Retired Software engineer  Tobacco Use   Smoking status: Former Smoker    Packs/day: 1.00    Years: 5.00    Pack years: 5.00   Smokeless tobacco: Never Used  Scientific laboratory technician Use: Never used  Substance and Sexual Activity   Alcohol use: Not Currently    Comment: rare   Drug use: No   Sexual activity: Not Currently

## 2020-02-26 ENCOUNTER — Telehealth: Payer: Self-pay | Admitting: Orthopedic Surgery

## 2020-02-26 ENCOUNTER — Other Ambulatory Visit: Payer: Self-pay | Admitting: Physician Assistant

## 2020-02-26 MED ORDER — SULFAMETHOXAZOLE-TRIMETHOPRIM 800-160 MG PO TABS
1.0000 | ORAL_TABLET | Freq: Two times a day (BID) | ORAL | 0 refills | Status: DC
Start: 2020-02-26 — End: 2020-04-05

## 2020-02-26 NOTE — Telephone Encounter (Signed)
Patient wife [name] called advised the Rx's are not showing at the [facility] yet. [name] asked for a call back when the Rx's are sent to the pharmacy. The number to contact [name] is [phone] Home or [phone] Cell

## 2020-02-26 NOTE — Telephone Encounter (Signed)
I called and sw pt's wife that rx has been sent

## 2020-02-26 NOTE — Telephone Encounter (Signed)
Dictation from visit yesterday is not complete are you aware of any rx that Dr. [name] was going to send in for the pt?

## 2020-02-26 NOTE — Telephone Encounter (Signed)
Bactrim sent

## 2020-02-29 ENCOUNTER — Encounter: Payer: Self-pay | Admitting: Orthopedic Surgery

## 2020-02-29 ENCOUNTER — Telehealth: Payer: Self-pay | Admitting: Orthopedic Surgery

## 2020-02-29 MED ORDER — MUPIROCIN 2 % EX OINT: 1.0000 "application " | TOPICAL_OINTMENT | Freq: Two times a day (BID) | CUTANEOUS | 3 refills | Status: DC

## 2020-02-29 MED ORDER — SULFAMETHOXAZOLE-TRIMETHOPRIM 800-160 MG PO TABS: 1.0000 | ORAL_TABLET | Freq: Two times a day (BID) | ORAL | 0 refills | Status: DC

## 2020-02-29 NOTE — Telephone Encounter (Signed)
Pt wife called because she said that you are going to prescribe her husband some type of ointment and when she called [facility] they didn't see it. Please call her.

## 2020-02-29 NOTE — Telephone Encounter (Signed)
I called and sw pt's wife to advise that this has been sent to pharm and can pick up today.

## 2020-03-10 ENCOUNTER — Ambulatory Visit: Payer: Medicare Other | Admitting: Orthopedic Surgery

## 2020-03-21 ENCOUNTER — Ambulatory Visit: Payer: Medicare Other | Admitting: Internal Medicine

## 2020-03-25 ENCOUNTER — Telehealth: Payer: Self-pay

## 2020-03-25 NOTE — Telephone Encounter (Signed)
FYI  I called Beth who states that patient's wife has actually broken her neck and is seeing a neurosurgeon and this is why they had to cancel the last appt. She is unable to drive and is using the [employer] for transportation help.  I called patient's wife and advised he will need to follow up in the office. She opted for appointment on Thursday when Dr. [name] will be back in the office. She understands that she will need to call for sooner work in if blister gets worse. Beth with [facility] is scheduled to go out on Monday, weather permitting. Patient's wife understands our office is closed on Monday.  Appt made with Dr. [name] on Thursday 03/31/2020.

## 2020-03-25 NOTE — Telephone Encounter (Signed)
Beth with [facility] called stating that patient has a new blister below the right knee that is painful and uncomfortable.  Stated that blister is not open or draining.  Cb# [phone].  Please advise.  Thank you.

## 2020-03-25 NOTE — Telephone Encounter (Signed)
Needs to follow up

## 2020-03-31 ENCOUNTER — Ambulatory Visit: Payer: Medicare Other | Admitting: Orthopedic Surgery

## 2020-04-04 ENCOUNTER — Ambulatory Visit: Payer: Medicare Other | Admitting: Orthopedic Surgery

## 2020-04-04 ENCOUNTER — Other Ambulatory Visit: Payer: Self-pay | Admitting: Physician Assistant

## 2020-04-05 ENCOUNTER — Other Ambulatory Visit: Payer: Self-pay | Admitting: *Deleted

## 2020-04-05 ENCOUNTER — Ambulatory Visit (INDEPENDENT_AMBULATORY_CARE_PROVIDER_SITE_OTHER): Payer: Medicare Other | Admitting: Orthopedic Surgery

## 2020-04-05 DIAGNOSIS — Z89511 Acquired absence of right leg below knee: Secondary | ICD-10-CM

## 2020-04-05 DIAGNOSIS — T8781 Dehiscence of amputation stump: Secondary | ICD-10-CM

## 2020-04-05 MED ORDER — SULFAMETHOXAZOLE-TRIMETHOPRIM 800-160 MG PO TABS: 1.0000 | ORAL_TABLET | Freq: Two times a day (BID) | ORAL | 0 refills | Status: DC

## 2020-04-05 NOTE — Patient Outreach (Signed)
[facility]) Care Management  04/05/2020  [name] Aug 17, 1933 [id number] Telephone outreach call to patient's wife [name]. HIPAA identifiers obtained. Wife states that she is unable to speak  because the patient has an appointment with Dr. [name] today and they need to get ready. [name] shared that she fell and broke her neck and currently is unable to drive. They are using [employer] transportation services but wife would like to know some other options that the patient could use that would transport them both to his providers appointments and would be knowledgeable about how to use a sliding board so that the patient can get into the vehicle safely. Nurse stated that she would send a referral to [place] resources and have them reach out to her to discuss other transportation options. [name] is also going to ask Dr. [name] if he will renew [place] services explaining that the patient has almost completed OT and PT and she feels he would benefit from ongoing nursing visits. Nurse discussed [facility] with patient's wife. [name] states that she would be interested in their services if they are unable to continue with [facility]. Nurse will send wife/patient [facility]'s flyer in the mail as a resource. Wife did not have any further questions or concerns today and did confirm that she has this nurse's contact number to call her if needed.    Plan: RN Health Coach will send patient/wife [facility] flyer and will send referral for community transportation assistance, will call patient/ wife within the month of February.  Robert Loron RN, [name] [name] [phone] [name]

## 2020-04-06 ENCOUNTER — Encounter: Payer: Self-pay | Admitting: Orthopedic Surgery

## 2020-04-06 NOTE — Progress Notes (Signed)
Office Visit Note   Patient: [name]           Date of Birth: [birth year]           MRN: [id number] Visit Date: 04/05/2020              Requested by: [name], [address] [place],  [place] [address] PCP: [name], MD  Chief Complaint  Patient presents with  . Right Leg - Follow-up    01/2019 revision right BKA       HPI: Patient is an 87 year old gentleman who presents with new blisters and scabs on the right below the knee amputation he is currently wearing a stump shrinker.  Patient is concerned he has developed a new infection.  Assessment & Plan: Visit Diagnoses:  1. Acquired absence of right lower extremity below knee ([place])   2. Dehiscence of amputation stump ([place])     Plan: Patient has had a reaction with doxycycline we will call in a prescription for Bactrim DS, Trental was refilled yesterday.  Continue with compression and dry dressing changes.  Follow-Up Instructions: Return in about 2 weeks (around 04/19/2020).   Ortho Exam  Patient is alert, oriented, no adenopathy, well-dressed, normal affect, normal respiratory effort. Examination patient has some new blisters on the right transtibial amputation that are about 5 mm in diameter there is clear drainage.  There is no cellulitis there are no depth to these blisters.  Imaging: No results found. No images are attached to the encounter.  Labs: Lab Results  Component Value Date   HGBA1C 5.7 (H) 03/26/2018   HGBA1C 5.0 07/12/2017   HGBA1C 5.8 (H) 07/06/2015   ESRSEDRATE 24 (H) 03/24/2018   ESRSEDRATE 15 04/18/2017   ESRSEDRATE 5 09/17/2007   CRP 3.1 (H) 05/11/2018   CRP 1.8 (H) 03/24/2018   CRP <0.8 04/24/2017   LABURIC 6.0 03/09/2016   REPTSTATUS 07/12/2019 FINAL 07/10/2019   GRAMSTAIN  07/15/2017    WBC PRESENT,BOTH PMN AND MONONUCLEAR NO ORGANISMS SEEN Performed at [facility], [place] [address], [place], [place] [address]    CULT >=100,000 COLONIES/mL ESCHERICHIA COLI  (A) 07/10/2019   LABORGA ESCHERICHIA COLI (A) 07/10/2019     Lab Results  Component Value Date   ALBUMIN 3.2 (L) 01/07/2020   ALBUMIN 3.6 10/16/2019   ALBUMIN 3.6 12/20/2018   PREALBUMIN 36 (H) 11/22/2017   PREALBUMIN 28.8 04/24/2017   LABURIC 6.0 03/09/2016    Lab Results  Component Value Date   MG 2.1 01/07/2020   MG 2.0 05/12/2018   MG 1.9 05/11/2018   No results found for: VD25OH  Lab Results  Component Value Date   PREALBUMIN 36 (H) 11/22/2017   PREALBUMIN 28.8 04/24/2017   CBC EXTENDED Latest Ref Rng & Units 01/11/2020 01/10/2020 01/09/2020  WBC 4.0 - 10.5 K/uL 8.5 7.6 7.2  RBC 4.22 - 5.81 MIL/uL 3.12(L) 2.33(L) 2.58(L)  HGB 13.0 - 17.0 g/dL 9.8(L) 7.3(L) 8.5(L)  HCT 39.0 - 52.0 % 30.6(L) 23.7(L) 26.9(L)  PLT 150 - 400 K/uL 129(L) 135(L) 136(L)  NEUTROABS 1.7 - 7.7 K/uL 6.4 6.1 -  LYMPHSABS 0.7 - 4.0 K/uL 0.9 0.6(L) -     There is no height or weight on file to calculate BMI.  Orders:  No orders of the defined types were placed in this encounter.  Meds ordered this encounter  Medications  . sulfamethoxazole-trimethoprim (BACTRIM DS) 800-160 MG tablet    Sig: Take 1 tablet by mouth 2 (two) times  daily.    Dispense:  30 tablet    Refill:  0     Procedures: No procedures performed  Clinical Data: No additional findings.  ROS:  All other systems negative, except as noted in the HPI. Review of Systems  Objective: Vital Signs: There were no vitals taken for this visit.  Specialty Comments:  No specialty comments available.  PMFS History: Patient Active Problem List   Diagnosis Date Noted  . Acute blood loss anemia   . Atypical chest pain   . Chronic duodenal ulcer with hemorrhage   . Gastrointestinal hemorrhage 01/07/2020  . Decreased hemoglobin   . Permanent atrial fibrillation ([name]) 10/24/2019  . Hypercholesterolemia 10/24/2019  . Current chronic use of systemic steroids 10/24/2019  . Macrocytic anemia 10/24/2019  . Dehiscence of  amputation stump ([place])   . Wound infection 05/09/2018  . AKI (acute kidney injury) ([place])   . S/P BKA (below knee amputation) unilateral, right ([place])   . Below-knee amputation with complication, initial encounter ([place])   . Tachypnea   . Post-operative pain   . Subacute osteomyelitis, right ankle and foot ([place])   . Paralysis of right lower extremity ([place])   . TIA (transient ischemic attack) 03/24/2018  . Encephalopathy 03/24/2018  . Cellulitis 03/15/2018  . Hypernatremia 03/15/2018  . Persistent atrial fibrillation ([place]) 11/10/2017  . Severe muscle deconditioning 11/10/2017  . Hemorrhage 10/22/2017  . Coronary artery disease involving native coronary artery of native heart without angina pectoris 10/22/2017  . Acute hypoxemic respiratory failure ([place]) 07/17/2017  . Aspiration syndrome, subsequent encounter 07/11/2017  . Aspiration pneumonia ([place]) 07/01/2017  . Acute encephalopathy 07/01/2017  . Thrombocytopenia ([place]) 07/01/2017  . Chronic diastolic (congestive) heart failure ([place]) 07/01/2017  . Anemia of chronic disease 07/01/2017  . Elevated troponin 07/01/2017  . Chronic atrial fibrillation 07/01/2017  . Goals of care, counseling/discussion   . Palliative care by specialist   . Acute metabolic encephalopathy 51/88/4166  . Stage 3a chronic kidney disease   . CAP (community acquired pneumonia) 04/17/2017  . Hallux rigidus, right foot 03/10/2016  . Lumbosacral spondylosis without myelopathy 10/29/2015  . Memory difficulty 09/22/2015  . Abnormality of gait 09/22/2015  . Hyperglycemia 07/06/2015  . Chronic pain 07/06/2015  . Chronic insomnia 03/30/2015  . Transient alteration of awareness 03/30/2015  . Abnormal liver function   . Altered mental status 01/31/2015  . Essential hypertension 01/31/2015  . Constipation 01/31/2015  . Hypothyroidism 01/31/2015  . Seizure disorder ([place]) 01/31/2015  . Bladder outlet obstruction 01/31/2015  . GERD (gastroesophageal reflux disease)  01/31/2015  . Chronic back pain 01/31/2015  . Acute kidney injury ([place]) 01/31/2015   Past Medical History:  Diagnosis Date  . Abnormality of gait 09/22/2015  . Arthritis   . Atrial fibrillation ([place])   . CAD (coronary artery disease)    Stent to RCA, Penta stent, 99% reduced to 0% 2002.  . Cancer ([place])    skin CA removed from back  . 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 Chronic insomnia 03/30/2015  . Chronic kidney disease, stage 3 ([place])   . Complication of anesthesia    trouble waking up  . GERD (gastroesophageal reflux disease)   . Hypercholesteremia   . Hypertension   . Hypothyroidism   . Iron deficiency anemia   . Memory difficulty 09/22/2015  . Osteoarthritis   . PAD (peripheral artery disease) ([place])   . Pneumonia   . Seizures ([place])   . Sepsis ([place]) 05/2017  .  Transient alteration of awareness 03/30/2015  . Vertigo    hx of  . Vitamin D deficiency     Family History  Problem Relation Age of Onset  . Hypertension Mother   . Cancer Mother   . Kidney failure Father   . Heart disease Father     Past Surgical History:  Procedure Laterality Date  . AMPUTATION Right 03/28/2018   Procedure: AMPUTATION BELOW KNEE;  Surgeon: Newt Minion, MD;  Location: [facility];  Service: Orthopedics;  Laterality: Right;  . APPLICATION OF WOUND VAC Right 01/23/2019   Procedure: Application Of  Prevena Wound Vac;  Surgeon: Newt Minion, MD;  Location: [facility];  Service: Orthopedics;  Laterality: Right;  . BACK SURGERY    . BIOPSY  01/08/2020   Procedure: BIOPSY;  Surgeon: Gatha Mayer, MD;  Location: [facility];  Service: Endoscopy;;  . ESOPHAGOGASTRODUODENOSCOPY (EGD) WITH PROPOFOL N/A 01/08/2020   Procedure: ESOPHAGOGASTRODUODENOSCOPY (EGD) WITH PROPOFOL;  Surgeon: Gatha Mayer, MD;  Location: [facility];  Service: Endoscopy;  Laterality: N/A;  . EYE SURGERY     Bilateral Cataract surgery   . HERNIA REPAIR    . I & D EXTREMITY Right 05/10/2015    Procedure: IRRIGATION AND DEBRIDEMENT EXTREMITY;  Surgeon: Leanora Cover, MD;  Location: [facility];  Service: Orthopedics;  Laterality: Right;  . KNEE ARTHROPLASTY     right knee X 2; left knee once  . LAMINECTOMY     X 6  . LEG AMPUTATION BELOW KNEE Right 03/28/2018  . POSTERIOR CERVICAL FUSION/FORAMINOTOMY  01/28/2012   Procedure: POSTERIOR CERVICAL FUSION/FORAMINOTOMY LEVEL 3;  Surgeon: [name], MD;  Location: [facility] [facility];  Service: Neurosurgery;  Laterality: Left;  Posterior Cervical Five-Thoracic One Fusion, Arthrodesis with LEFT Cervical Seven-thoracic One Laminectomy, Foraminotomy and Resection of Synovial Cyst  . POSTERIOR CERVICAL FUSION/FORAMINOTOMY N/A 01/29/2013   Procedure: POSTERIOR CERVICAL FUSION/FORAMINOTOMY LEVEL 1 and C2-5 Posteriolateral Arthrodesis;  Surgeon: [name], MD;  Location: [facility];  Service: Neurosurgery;  Laterality: N/A;  C2-C3 Laminectomy C2-C3 posterior cervical arthrodesis  . STUMP REVISION Right 01/23/2019   Procedure: REVISION RIGHT BELOW KNEE AMPUTATION;  Surgeon: Newt Minion, MD;  Location: [facility];  Service: Orthopedics;  Laterality: Right;  . TONSILLECTOMY     Social History   Occupational History  . Occupation: Retired Software engineer  Tobacco Use  . Smoking status: Former Smoker    Packs/day: 1.00    Years: 5.00    Pack years: 5.00  . Smokeless tobacco: Never Used  Vaping Use  . Vaping Use: Never used  Substance and Sexual Activity  . Alcohol use: Not Currently    Comment: rare  . Drug use: No  . Sexual activity: Not Currently

## 2020-04-11 ENCOUNTER — Telehealth: Payer: Self-pay | Admitting: Internal Medicine

## 2020-04-11 NOTE — Telephone Encounter (Signed)
   Telephone encounter was:  Successful.  04/11/2020 Name: MIN COLLYMORE MRN: [id number] DOB: [birth year]  NORMAND DAMRON is a 87 y.o. year old male who is a primary care patient of [name], MD . The community resource team was consulted for assistance with Transportation Needs   Care guide performed the following interventions: Patient provided with information about care guide support team and interviewed to confirm resource needs Discussed resources to assist with transportation needs. Pt is in a wheelchair and uses a sliding board to get in and out of bed, cars etc. [name] his wife goes to appointments with him because she is the one that dresses his wounds on his amputated leg. She is asking for a type of ride that will allow her to be transported with him and the wheelchair. She couldn't talk much today and I will follow up with her in a few days. .  Follow Up Plan:  Care guide will follow up with patient by phone over the next few days.   [place], Care Management Phone: [phone] Email: [email or web address]

## 2020-04-14 ENCOUNTER — Ambulatory Visit: Payer: Medicare Other | Admitting: Orthopedic Surgery

## 2020-04-19 ENCOUNTER — Ambulatory Visit: Payer: Medicare Other | Admitting: Physician Assistant

## 2020-04-19 ENCOUNTER — Telehealth: Payer: Self-pay | Admitting: Internal Medicine

## 2020-04-19 NOTE — Telephone Encounter (Signed)
   Telephone encounter was:  Unsuccessful.  04/19/2020 Name: [name] MRN: [id number] DOB: 1933/04/21  Unsuccessful outbound call made today to assist with:  Transportation Needs   Outreach Attempt:  2nd Attempt  A HIPAA compliant voice message was left requesting a return call.  Instructed patient to call back at [phone].  [place], Care Management Phone: [phone] Email: [email or web address]

## 2020-04-22 ENCOUNTER — Telehealth: Payer: Self-pay | Admitting: Internal Medicine

## 2020-04-22 NOTE — Telephone Encounter (Signed)
   Telephone encounter was:  Unsuccessful.  04/22/2020 Name: [name] MRN: [id number] DOB: 1933-11-28  Unsuccessful outbound call made today to assist with:  Transportation Needs   Outreach Attempt:  3rd Attempt.  Referral closed unable to contact patient.  A HIPAA compliant voice message was left requesting a return call.  Instructed patient to call back at [phone].  [place], Care Management Phone: [phone] Email: [email or web address]

## 2020-04-25 ENCOUNTER — Ambulatory Visit (INDEPENDENT_AMBULATORY_CARE_PROVIDER_SITE_OTHER): Payer: Medicare Other | Admitting: Orthopedic Surgery

## 2020-04-25 DIAGNOSIS — Z89511 Acquired absence of right leg below knee: Secondary | ICD-10-CM

## 2020-04-25 DIAGNOSIS — T8781 Dehiscence of amputation stump: Secondary | ICD-10-CM

## 2020-04-26 ENCOUNTER — Encounter: Payer: Self-pay | Admitting: Physician Assistant

## 2020-04-26 ENCOUNTER — Other Ambulatory Visit: Payer: Self-pay | Admitting: *Deleted

## 2020-04-26 NOTE — Progress Notes (Signed)
Office Visit Note   Patient: [name]           Date of Birth: Apr 04, 1933           MRN: [id number] Visit Date: 04/25/2020              Requested by: [name], [place] [place],  [place] [address] PCP: [name], MD  Chief Complaint  Patient presents with  . Right Leg - Follow-up    01/30/19 right BKA revision       HPI: Patient is an 87 year old gentleman who is status post right below the knee amputation revision from November 2020.  Patient states that the scabs pull off when he removes his shrinker.  Assessment & Plan: Visit Diagnoses:  1. Acquired absence of right lower extremity below knee ([place])   2. Dehiscence of amputation stump ([place])     Plan: We will continue the current care patient's leg is showing good steady improvement.  Recommended using Allevyn pad over the posterior aspect of his thigh where he is developing a pressure area from his wheelchair  Follow-Up Instructions: Return in about 3 weeks (around 05/16/2020).   Ortho Exam  Patient is alert, oriented, no adenopathy, well-dressed, normal affect, normal respiratory effort. Examination the right lower extremity there is no cellulitis or swelling there is a few small wounds 2 mm in diameter with healthy granulation tissue this was touched with silver nitrate.  There is no drainage no maceration no cellulitis patient developed a pressure area over the posterior aspect of his thigh from sitting in his wheelchair.  Imaging: No results found. No images are attached to the encounter.  Labs: Lab Results  Component Value Date   HGBA1C 5.7 (H) 03/26/2018   HGBA1C 5.0 07/12/2017   HGBA1C 5.8 (H) 07/06/2015   ESRSEDRATE 24 (H) 03/24/2018   ESRSEDRATE 15 04/18/2017   ESRSEDRATE 5 09/17/2007   CRP 3.1 (H) 05/11/2018   CRP 1.8 (H) 03/24/2018   CRP <0.8 04/24/2017   LABURIC 6.0 03/09/2016   REPTSTATUS 07/12/2019 FINAL 07/10/2019   GRAMSTAIN  07/15/2017    WBC PRESENT,BOTH PMN AND  MONONUCLEAR NO ORGANISMS SEEN Performed at [facility], [place] [address], [place], [place] [address]    CULT >=100,000 COLONIES/mL ESCHERICHIA COLI (A) 07/10/2019   LABORGA ESCHERICHIA COLI (A) 07/10/2019     Lab Results  Component Value Date   ALBUMIN 3.2 (L) 01/07/2020   ALBUMIN 3.6 10/16/2019   ALBUMIN 3.6 12/20/2018   PREALBUMIN 36 (H) 11/22/2017   PREALBUMIN 28.8 04/24/2017   LABURIC 6.0 03/09/2016    Lab Results  Component Value Date   MG 2.1 01/07/2020   MG 2.0 05/12/2018   MG 1.9 05/11/2018   No results found for: VD25OH  Lab Results  Component Value Date   PREALBUMIN 36 (H) 11/22/2017   PREALBUMIN 28.8 04/24/2017   CBC EXTENDED Latest Ref Rng & Units 01/11/2020 01/10/2020 01/09/2020  WBC 4.0 - 10.5 K/uL 8.5 7.6 7.2  RBC 4.22 - 5.81 MIL/uL 3.12(L) 2.33(L) 2.58(L)  HGB 13.0 - 17.0 g/dL 9.8(L) 7.3(L) 8.5(L)  HCT 39.0 - 52.0 % 30.6(L) 23.7(L) 26.9(L)  PLT 150 - 400 K/uL 129(L) 135(L) 136(L)  NEUTROABS 1.7 - 7.7 K/uL 6.4 6.1 -  LYMPHSABS 0.7 - 4.0 K/uL 0.9 0.6(L) -     There is no height or weight on file to calculate BMI.  Orders:  No orders of the defined types were placed in this encounter.  No  orders of the defined types were placed in this encounter.    Procedures: No procedures performed  Clinical Data: No additional findings.  ROS:  All other systems negative, except as noted in the HPI. Review of Systems  Objective: Vital Signs: There were no vitals taken for this visit.  Specialty Comments:  No specialty comments available.  PMFS History: Patient Active Problem List   Diagnosis Date Noted  . Acute blood loss anemia   . Atypical chest pain   . Chronic duodenal ulcer with hemorrhage   . Gastrointestinal hemorrhage 01/07/2020  . Decreased hemoglobin   . Permanent atrial fibrillation ([place]) 10/24/2019  . Hypercholesterolemia 10/24/2019  . Current chronic use of systemic steroids 10/24/2019  . Macrocytic anemia 10/24/2019  .  Dehiscence of amputation stump ([place])   . Wound infection 05/09/2018  . AKI (acute kidney injury) ([place])   . S/P BKA (below knee amputation) unilateral, right ([place])   . Below-knee amputation with complication, initial encounter ([place])   . Tachypnea   . Post-operative pain   . Subacute osteomyelitis, right ankle and foot ([place])   . Paralysis of right lower extremity ([place])   . TIA (transient ischemic attack) 03/24/2018  . Encephalopathy 03/24/2018  . Cellulitis 03/15/2018  . Hypernatremia 03/15/2018  . Persistent atrial fibrillation ([place]) 11/10/2017  . Severe muscle deconditioning 11/10/2017  . Hemorrhage 10/22/2017  . Coronary artery disease involving native coronary artery of native heart without angina pectoris 10/22/2017  . Acute hypoxemic respiratory failure ([place]) 07/17/2017  . Aspiration syndrome, subsequent encounter 07/11/2017  . Aspiration pneumonia ([place]) 07/01/2017  . Acute encephalopathy 07/01/2017  . Thrombocytopenia ([place]) 07/01/2017  . Chronic diastolic (congestive) heart failure ([place]) 07/01/2017  . Anemia of chronic disease 07/01/2017  . Elevated troponin 07/01/2017  . Chronic atrial fibrillation 07/01/2017  . Goals of care, counseling/discussion   . Palliative care by specialist   . Acute metabolic encephalopathy 63/14/9702  . Stage 3a chronic kidney disease   . CAP (community acquired pneumonia) 04/17/2017  . Hallux rigidus, right foot 03/10/2016  . Lumbosacral spondylosis without myelopathy 10/29/2015  . Memory difficulty 09/22/2015  . Abnormality of gait 09/22/2015  . Hyperglycemia 07/06/2015  . Chronic pain 07/06/2015  . Chronic insomnia 03/30/2015  . Transient alteration of awareness 03/30/2015  . Abnormal liver function   . Altered mental status 01/31/2015  . Essential hypertension 01/31/2015  . Constipation 01/31/2015  . Hypothyroidism 01/31/2015  . Seizure disorder ([place]) 01/31/2015  . Bladder outlet obstruction 01/31/2015  . GERD (gastroesophageal reflux  disease) 01/31/2015  . Chronic back pain 01/31/2015  . Acute kidney injury ([place]) 01/31/2015   Past Medical History:  Diagnosis Date  . Abnormality of gait 09/22/2015  . Arthritis   . Atrial fibrillation ([place])   . CAD (coronary artery disease)    Stent to RCA, Penta stent, 99% reduced to 0% 2002.  . Cancer ([facility])    skin CA removed from back  . 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 Chronic insomnia 03/30/2015  . Chronic kidney disease, stage 3 ([place])   . Complication of anesthesia    trouble waking up  . GERD (gastroesophageal reflux disease)   . Hypercholesteremia   . Hypertension   . Hypothyroidism   . Iron deficiency anemia   . Memory difficulty 09/22/2015  . Osteoarthritis   . PAD (peripheral artery disease) ([place])   . Pneumonia   . Seizures ([place])   . Sepsis ([place]) 05/2017  . Transient alteration of awareness  03/30/2015  . Vertigo    hx of  . Vitamin D deficiency     Family History  Problem Relation Age of Onset  . Hypertension Mother   . Cancer Mother   . Kidney failure Father   . Heart disease Father     Past Surgical History:  Procedure Laterality Date  . AMPUTATION Right 03/28/2018   Procedure: AMPUTATION BELOW KNEE;  Surgeon: Newt Minion, MD;  Location: [facility];  Service: Orthopedics;  Laterality: Right;  . APPLICATION OF WOUND VAC Right 01/23/2019   Procedure: Application Of  Prevena Wound Vac;  Surgeon: Newt Minion, MD;  Location: [facility];  Service: Orthopedics;  Laterality: Right;  . BACK SURGERY    . BIOPSY  01/08/2020   Procedure: BIOPSY;  Surgeon: Gatha Mayer, MD;  Location: [facility];  Service: Endoscopy;;  . ESOPHAGOGASTRODUODENOSCOPY (EGD) WITH PROPOFOL N/A 01/08/2020   Procedure: ESOPHAGOGASTRODUODENOSCOPY (EGD) WITH PROPOFOL;  Surgeon: Gatha Mayer, MD;  Location: [facility];  Service: Endoscopy;  Laterality: N/A;  . EYE SURGERY     Bilateral Cataract surgery   . HERNIA REPAIR    . I & D EXTREMITY Right 05/10/2015    Procedure: IRRIGATION AND DEBRIDEMENT EXTREMITY;  Surgeon: Leanora Cover, MD;  Location: [facility];  Service: Orthopedics;  Laterality: Right;  . KNEE ARTHROPLASTY     right knee X 2; left knee once  . LAMINECTOMY     X 6  . LEG AMPUTATION BELOW KNEE Right 03/28/2018  . POSTERIOR CERVICAL FUSION/FORAMINOTOMY  01/28/2012   Procedure: POSTERIOR CERVICAL FUSION/FORAMINOTOMY LEVEL 3;  Surgeon: [name], MD;  Location: [facility];  Service: Neurosurgery;  Laterality: Left;  Posterior Cervical Five-Thoracic One Fusion, Arthrodesis with LEFT Cervical Seven-thoracic One Laminectomy, Foraminotomy and Resection of Synovial Cyst  . POSTERIOR CERVICAL FUSION/FORAMINOTOMY N/A 01/29/2013   Procedure: POSTERIOR CERVICAL FUSION/FORAMINOTOMY LEVEL 1 and C2-5 Posteriolateral Arthrodesis;  Surgeon: [name], MD;  Location: [facility];  Service: Neurosurgery;  Laterality: N/A;  C2-C3 Laminectomy C2-C3 posterior cervical arthrodesis  . STUMP REVISION Right 01/23/2019   Procedure: REVISION RIGHT BELOW KNEE AMPUTATION;  Surgeon: Newt Minion, MD;  Location: [facility];  Service: Orthopedics;  Laterality: Right;  . TONSILLECTOMY     Social History   Occupational History  . Occupation: Retired Software engineer  Tobacco Use  . Smoking status: Former Smoker    Packs/day: 1.00    Years: 5.00    Pack years: 5.00  . Smokeless tobacco: Never Used  Vaping Use  . Vaping Use: Never used  Substance and Sexual Activity  . Alcohol use: Not Currently    Comment: rare  . Drug use: No  . Sexual activity: Not Currently

## 2020-04-26 NOTE — Patient Outreach (Signed)
[facility]) Care Management  [place]  04/26/2020   JERMON CHALFANT 14-Oct-1933 [id number]  Subjective: Successful telephone outreach call to patient's wife [name]. HIPAA identifiers obtained. Wife explained that she is no able to drive and is no longer wearing the neck brace after she broke her neck. This has made it much easier to care for the patient. [name] stated the patient and she are getting back to their normal routine. The patient saw Dr. [name] 04/25/20 and the sores on his stump are beginning to heal. Wife will continue to do his dressing changes at home as she has for a long while. Alvis Lemmings will complete their visits soon and at this time Ms. Schiraldi does not feel that she will need Remote Health services. Adding that it becomes overwhelming at times to have so many individuals coming in and out of the home. Nurse encouraged [name] to call her if she felt like Remote Health would be beneficial.   [name] does take the patient's B/P, pulse, and monitors his calf circumference daily to assess for fluid retention due to patient not being able to weigh himself. The patient's cardiologist has instructed her to double up on the patient's lasix and potassium if his calf measures > 14 inches. [name] reports that the patient is eating pretty well and she will continue to supplement with high protein for healing and ensure the patient is adhering to a low sodium diet. Nurse will send the patient Ensure Coupons. The wife did not have any further question or concerns and did confirm that she has this nurse's contact information to call her if needed  Encounter Medications:  Outpatient Encounter Medications as of 04/26/2020  Medication Sig Note  . alendronate (FOSAMAX) 70 MG tablet Take 70 mg by mouth every Wednesday. Remain upright for 30-60 minutes.   Marland Kitchen aspirin EC 81 MG tablet Take 81 mg by mouth daily.   . Calcium Carb-Cholecalciferol (CALCIUM 600+D) 600-800 MG-UNIT TABS  Take 1 tablet by mouth daily.   Marland Kitchen diltiazem (CARDIZEM CD) 240 MG 24 hr capsule TAKE 1 CAPSULE BY MOUTH EVERY MORNING. HOLD IF SYSTOLIC BLOOD PRESSURE IS LESS THAN 100 (Patient taking differently: Take 240 mg by mouth daily.)   . divalproex (DEPAKOTE ER) 250 MG 24 hr tablet Take 250 mg by mouth in the morning and at bedtime.    Marland Kitchen doxazosin (CARDURA) 4 MG tablet TAKE 1 TABLET(4 MG) BY MOUTH AT BEDTIME (Patient taking differently: Take 4 mg by mouth at bedtime.)   . furosemide (LASIX) 20 MG tablet Take 1 tablet (20 mg total) by mouth daily.   Marland Kitchen HYDROcodone-acetaminophen (NORCO) 10-325 MG tablet Take 1 tablet by mouth every 4 (four) hours as needed for pain.   Marland Kitchen levothyroxine (SYNTHROID, LEVOTHROID) 150 MCG tablet Take 150 mcg by mouth daily before breakfast.   . Multiple Vitamins-Minerals (CERTAVITE/ANTIOXIDANTS) TABS Take 1 tablet by mouth every evening.    . mupirocin ointment (BACTROBAN) 2 % Apply 1 application topically 2 (two) times daily. Apply to the affected area 2 times a day   . nitrofurantoin (MACRODANTIN) 100 MG capsule Take 100 mg by mouth daily. 01/07/2020: Continuous   . nitroGLYCERIN (NITROSTAT) 0.4 MG SL tablet Place 1 tablet (0.4 mg total) under the tongue every 5 (five) minutes as needed for chest pain (call 911 afer 3 doses and if chest pain persists).   . pentoxifylline (TRENTAL) 400 MG CR tablet TAKE 1 TABLET(400 MG) BY MOUTH THREE TIMES DAILY WITH MEALS   .  polyethylene glycol (MIRALAX / GLYCOLAX) 17 g packet Take 17 g by mouth at bedtime.   . potassium chloride SA (KLOR-CON) 20 MEQ tablet Take 0.5 tablets (10 mEq total) by mouth daily.   . pravastatin (PRAVACHOL) 40 MG tablet Take 40 mg by mouth every evening.    . predniSONE (DELTASONE) 1 MG tablet Take 9 mg by mouth daily.   Marland Kitchen doxycycline (VIBRA-TABS) 100 MG tablet Take 1 tablet (100 mg total) by mouth 2 (two) times daily. (Patient not taking: Reported on 04/26/2020) 04/26/2020: completed  . metoprolol tartrate (LOPRESSOR) 25 MG  tablet TAKE 1 TABLET BY MOUTH TWICE DAILY WITH A MEAL OR IMMEDIATELY AFTER (Patient taking differently: Take 25 mg by mouth 2 (two) times daily.)   . pantoprazole (PROTONIX) 40 MG tablet Take 1 tablet (40 mg total) by mouth 2 (two) times daily.   Marland Kitchen sulfamethoxazole-trimethoprim (BACTRIM DS) 800-160 MG tablet Take 1 tablet by mouth 2 (two) times daily. (Patient not taking: Reported on 04/26/2020) 04/26/2020: completed  . sulfamethoxazole-trimethoprim (BACTRIM DS) 800-160 MG tablet Take 1 tablet by mouth 2 (two) times daily. (Patient not taking: Reported on 04/26/2020) 04/26/2020: completed   No facility-administered encounter medications on file as of 04/26/2020.    Functional Status:  In your present state of health, do you have any difficulty performing the following activities: 01/07/2020 01/07/2020  Hearing? - N  Vision? - N  Difficulty concentrating or making decisions? - N  Walking or climbing stairs? - Y  Comment - -  Dressing or bathing? - N  Comment - -  Doing errands, shopping? [place] and eating ? - -  Comment - -  Using the Toilet? - -  Comment - -  In the past six months, have you accidently leaked urine? - -  Comment - -  Do you have problems with loss of bowel control? - -  Managing your Medications? - -  Comment - -  Managing your Finances? - -  Housekeeping or managing your Housekeeping? - -  Comment - -  Some recent data might be hidden    Fall/Depression Screening: Fall Risk  04/26/2020 01/15/2020 01/15/2020  Falls in the past year? 1 1 1   Number falls in past yr: 1 1 1   Injury with Fall? 0 0 0  Risk for fall due to : History of fall(s) History of fall(s);Impaired balance/gait;Impaired mobility;Medication side effect History of fall(s);Impaired balance/gait;Impaired mobility  Risk for fall due to: Comment - - -  Follow up Falls prevention discussed;Education provided;Falls evaluation completed Falls prevention discussed;Education  provided;Falls evaluation completed Falls evaluation completed;Education provided;Falls prevention discussed  Comment - - -   PHQ 2/9 Scores 01/05/2020 12/15/2019 06/03/2018 04/07/2018 09/04/2017  PHQ - 2 Score 2 2 2  0 0  PHQ- 9 Score 5 7 4  - -    Assessment:  Goals Addressed            This Visit's Progress   . THN Patient will continue to monitor his B/P and record the value daily for the next 90 days.       Timeframe:  Long-Range Goal Priority:  High Start Date:  12/16/19                           Expected End Date: 01/08/21                      Follow Up  Date 08/08/20   - check blood pressure daily - write blood pressure results in a log or diary    Why is this important?   You won't feel high blood pressure, but it can still hurt your blood vessels.  High blood pressure can cause heart or kidney problems. It can also cause a stroke.  Making lifestyle changes like losing a little weight or eating less salt will help.  Checking your blood pressure at home and at different times of the day can help to control blood pressure.  If the doctor prescribes medicine remember to take it the way the doctor ordered.  Call the office if you cannot afford the medicine or if there are questions about it.     Notes: wife takes the patient's B/P daily and records the values. Wife will call the provider with concerning values.  Updated 01/15/20 Updated 04/26/20: Wife reports continuation of monitoring the patient's B/P daily and recording the values. She does contact the patient's provider as needed.    Margie Billet patient/wife will continue to monitor the patient for increase swelling and follow a low sodium diet for the next 90 days       Timeframe:  Long-Range Goal Priority:  High Start Date: 12/16/19                            Expected End Date: 01/08/21                      Follow Up Date 08/08/20    - use salt in moderation - watch for swelling in feet, ankles and legs every day    Why is  this important?   It is important to check your weight daily and watch how much salt and liquids you have.  It will help you to manage your heart failure.    Notes: Patient cannot weigh due to BKA. Wife has been instructed to measure calf and if > than 14 inches double lasix and potassium. Patient adheres to a low salt diet.   Updated: 01/15/20 Updated 04/26/20: Patient cannot weigh due to BKA. Wife has been instructed to measure calf and if > than 14 inches double lasix and potassium. Patient adheres to a low salt diet.     Margie Billet Wife will continue to measure the circumference of the patient's calf daily and give additional Lasix and K+ for measurment > 14 inches for the next 90 days.       Timeframe:  Long-Range Goal Priority:  High Start Date: 12/16/19                            Expected End Date: 01/08/21                      Follow Up Date 08/08/20   - begin a heart failure diary - develop a rescue plan - eat more whole grains, fruits and vegetables, lean meats and healthy fats - follow rescue plan if symptoms flare-up - know when to call the doctor - track symptoms and what helps feel better or worse - dress right for the weather, hot or cold    Why is this important?   You will be able to handle your symptoms better if you keep track of them.  Making some simple changes to your lifestyle will help.  Eating healthy  is one thing you can do to take good care of yourself.    Notes: Wife has action plan to measure calf circumference if > 14 inches double lasix and potassium for the day due to patient cannot weigh Updated 04/26/20: Wife/patient continues to follow action plan and will contact provider as needed.       Plan: RN Health Coach will send PCP a quarterly updated, will send the patient Ensure coupons, and will call patient/wife within the month of May. Follow-up:  Patient agrees to Care Plan and Follow-up.   Emelia Loron RN, BSN [place] [phone] [email or web address]

## 2020-04-26 NOTE — Patient Instructions (Signed)
Goals Addressed            This Visit's Progress   . THN Patient will continue to monitor his B/P and record the value daily for the next 90 days.       Timeframe:  Long-Range Goal Priority:  High Start Date:  12/16/19                           Expected End Date: 01/08/21                      Follow Up Date 08/08/20   - check blood pressure daily - write blood pressure results in a log or diary    Why is this important?   You won't feel high blood pressure, but it can still hurt your blood vessels.  High blood pressure can cause heart or kidney problems. It can also cause a stroke.  Making lifestyle changes like losing a little weight or eating less salt will help.  Checking your blood pressure at home and at different times of the day can help to control blood pressure.  If the doctor prescribes medicine remember to take it the way the doctor ordered.  Call the office if you cannot afford the medicine or if there are questions about it.     Notes: wife takes the patient's B/P daily and records the values. Wife will call the provider with concerning values.  Updated 01/15/20 Updated 04/26/20: Wife reports continuation of monitoring the patient's B/P daily and recording the values. She does contact the patient's provider as needed.    Margie Billet patient/wife will continue to monitor the patient for increase swelling and follow a low sodium diet for the next 90 days       Timeframe:  Long-Range Goal Priority:  High Start Date: 12/16/19                            Expected End Date: 01/08/21                      Follow Up Date 08/08/20    - use salt in moderation - watch for swelling in feet, ankles and legs every day    Why is this important?   It is important to check your weight daily and watch how much salt and liquids you have.  It will help you to manage your heart failure.    Notes: Patient cannot weigh due to BKA. Wife has been instructed to measure calf and if > than 14 inches  double lasix and potassium. Patient adheres to a low salt diet.   Updated: 01/15/20 Updated 04/26/20: Patient cannot weigh due to BKA. Wife has been instructed to measure calf and if > than 14 inches double lasix and potassium. Patient adheres to a low salt diet.     Margie Billet Wife will continue to measure the circumference of the patient's calf daily and give additional Lasix and K+ for measurment > 14 inches for the next 90 days.       Timeframe:  Long-Range Goal Priority:  High Start Date: 12/16/19                            Expected End Date: 01/08/21  Follow Up Date 08/08/20   - begin a heart failure diary - develop a rescue plan - eat more whole grains, fruits and vegetables, lean meats and healthy fats - follow rescue plan if symptoms flare-up - know when to call the doctor - track symptoms and what helps feel better or worse - dress right for the weather, hot or cold    Why is this important?   You will be able to handle your symptoms better if you keep track of them.  Making some simple changes to your lifestyle will help.  Eating healthy is one thing you can do to take good care of yourself.    Notes: Wife has action plan to measure calf circumference if > 14 inches double lasix and potassium for the day due to patient cannot weigh Updated 04/26/20: Wife/patient continues to follow action plan and will contact provider as needed.

## 2020-05-02 ENCOUNTER — Telehealth: Payer: Self-pay | Admitting: Cardiovascular Disease

## 2020-05-02 MED ORDER — METOPROLOL TARTRATE 25 MG PO TABS: ORAL_TABLET | ORAL | 3 refills | Status: DC

## 2020-05-02 NOTE — Telephone Encounter (Signed)
Returned the call to the patient and the wife who was also on the phone. He stated that he has been having intermittent chest pain for the past few months. He stated that this is a dull ache that does not radiate. He stated that it normally happens at night and Rolaids makes it better. He denies shortness of breath.  He asked for an appointment with Dr. Sallyanne Kuster and has been advised that he is out of the office right now. He was offered an appointment with a PA this week but declined. He stated that he will "wait and see how it goes with the ache." He has been given ED precautions and verbalized his understanding.   He stated that he will call back for an appointment or go to the ED if needed.

## 2020-05-02 NOTE — Telephone Encounter (Signed)
°*  STAT* If patient is at the pharmacy, call can be transferred to refill team.   1. Which medications need to be refilled? (please list name of each medication and dose if known) metoprolol tartrate (LOPRESSOR) 25 MG tablet  2. Which pharmacy/location (including street and city if local pharmacy) is medication to be sent to?  [facility] [place]     3. Do they need a 30 day or 90 day supply? 90 day supply

## 2020-05-02 NOTE — Telephone Encounter (Signed)
Pt c/o of Chest Pain: STAT if CP now or developed within 24 hours  1. Are you having CP right now? No   2. Are you experiencing any other symptoms (ex. SOB, nausea, vomiting, sweating)? No   3. How long have you been experiencing CP? Past 3 days, per patient's wife   4. Is your CP continuous or coming and going? Continuous   5. Have you taken Nitroglycerin? No  ?

## 2020-05-16 ENCOUNTER — Ambulatory Visit: Payer: Medicare Other | Admitting: Orthopedic Surgery

## 2020-05-23 ENCOUNTER — Encounter (HOSPITAL_COMMUNITY): Payer: Self-pay

## 2020-05-23 ENCOUNTER — Inpatient Hospital Stay (HOSPITAL_COMMUNITY)
Admission: EM | Admit: 2020-05-23 | Discharge: 2020-05-26 | DRG: 062 | Disposition: A | Discharge status: Rehabilitation Facility | Payer: Medicare Other | Attending: Neurology | Admitting: Neurology

## 2020-05-23 ENCOUNTER — Emergency Department (HOSPITAL_COMMUNITY): Payer: Medicare Other

## 2020-05-23 ENCOUNTER — Inpatient Hospital Stay (HOSPITAL_COMMUNITY): Payer: Medicare Other

## 2020-05-23 ENCOUNTER — Other Ambulatory Visit: Payer: Self-pay

## 2020-05-23 DIAGNOSIS — E1151 Type 2 diabetes mellitus with diabetic peripheral angiopathy without gangrene: Secondary | ICD-10-CM | POA: Diagnosis present

## 2020-05-23 DIAGNOSIS — N1831 Chronic kidney disease, stage 3a: Secondary | ICD-10-CM | POA: Diagnosis present

## 2020-05-23 DIAGNOSIS — I5032 Chronic diastolic (congestive) heart failure: Secondary | ICD-10-CM | POA: Diagnosis present

## 2020-05-23 DIAGNOSIS — G8321 Monoplegia of upper limb affecting right dominant side: Secondary | ICD-10-CM | POA: Diagnosis present

## 2020-05-23 DIAGNOSIS — Z85828 Personal history of other malignant neoplasm of skin: Secondary | ICD-10-CM

## 2020-05-23 DIAGNOSIS — R1312 Dysphagia, oropharyngeal phase: Secondary | ICD-10-CM | POA: Diagnosis not present

## 2020-05-23 DIAGNOSIS — I6389 Other cerebral infarction: Secondary | ICD-10-CM

## 2020-05-23 DIAGNOSIS — I69322 Dysarthria following cerebral infarction: Secondary | ICD-10-CM | POA: Diagnosis present

## 2020-05-23 DIAGNOSIS — I480 Paroxysmal atrial fibrillation: Secondary | ICD-10-CM | POA: Diagnosis present

## 2020-05-23 DIAGNOSIS — R29722 NIHSS score 22: Secondary | ICD-10-CM | POA: Diagnosis present

## 2020-05-23 DIAGNOSIS — M069 Rheumatoid arthritis, unspecified: Secondary | ICD-10-CM | POA: Diagnosis present

## 2020-05-23 DIAGNOSIS — G8918 Other acute postprocedural pain: Secondary | ICD-10-CM | POA: Diagnosis not present

## 2020-05-23 DIAGNOSIS — R1310 Dysphagia, unspecified: Secondary | ICD-10-CM | POA: Diagnosis not present

## 2020-05-23 DIAGNOSIS — F0390 Unspecified dementia without behavioral disturbance: Secondary | ICD-10-CM | POA: Diagnosis present

## 2020-05-23 DIAGNOSIS — I639 Cerebral infarction, unspecified: Secondary | ICD-10-CM | POA: Diagnosis present

## 2020-05-23 DIAGNOSIS — F5104 Psychophysiologic insomnia: Secondary | ICD-10-CM | POA: Diagnosis present

## 2020-05-23 DIAGNOSIS — R059 Cough, unspecified: Secondary | ICD-10-CM

## 2020-05-23 DIAGNOSIS — Z87891 Personal history of nicotine dependence: Secondary | ICD-10-CM | POA: Diagnosis not present

## 2020-05-23 DIAGNOSIS — E785 Hyperlipidemia, unspecified: Secondary | ICD-10-CM | POA: Diagnosis present

## 2020-05-23 DIAGNOSIS — I63412 Cerebral infarction due to embolism of left middle cerebral artery: Secondary | ICD-10-CM | POA: Diagnosis not present

## 2020-05-23 DIAGNOSIS — R54 Age-related physical debility: Secondary | ICD-10-CM | POA: Diagnosis present

## 2020-05-23 DIAGNOSIS — I63512 Cerebral infarction due to unspecified occlusion or stenosis of left middle cerebral artery: Secondary | ICD-10-CM | POA: Diagnosis present

## 2020-05-23 DIAGNOSIS — Z8701 Personal history of pneumonia (recurrent): Secondary | ICD-10-CM

## 2020-05-23 DIAGNOSIS — Z8673 Personal history of transient ischemic attack (TIA), and cerebral infarction without residual deficits: Secondary | ICD-10-CM

## 2020-05-23 DIAGNOSIS — Z7982 Long term (current) use of aspirin: Secondary | ICD-10-CM

## 2020-05-23 DIAGNOSIS — K219 Gastro-esophageal reflux disease without esophagitis: Secondary | ICD-10-CM | POA: Diagnosis present

## 2020-05-23 DIAGNOSIS — E7800 Pure hypercholesterolemia, unspecified: Secondary | ICD-10-CM | POA: Diagnosis present

## 2020-05-23 DIAGNOSIS — Z8249 Family history of ischemic heart disease and other diseases of the circulatory system: Secondary | ICD-10-CM

## 2020-05-23 DIAGNOSIS — I69320 Aphasia following cerebral infarction: Secondary | ICD-10-CM | POA: Diagnosis not present

## 2020-05-23 DIAGNOSIS — I130 Hypertensive heart and chronic kidney disease with heart failure and stage 1 through stage 4 chronic kidney disease, or unspecified chronic kidney disease: Secondary | ICD-10-CM | POA: Diagnosis present

## 2020-05-23 DIAGNOSIS — Z809 Family history of malignant neoplasm, unspecified: Secondary | ICD-10-CM | POA: Diagnosis not present

## 2020-05-23 DIAGNOSIS — R471 Dysarthria and anarthria: Secondary | ICD-10-CM | POA: Diagnosis present

## 2020-05-23 DIAGNOSIS — I6300 Cerebral infarction due to thrombosis of unspecified precerebral artery: Secondary | ICD-10-CM | POA: Diagnosis not present

## 2020-05-23 DIAGNOSIS — I2510 Atherosclerotic heart disease of native coronary artery without angina pectoris: Secondary | ICD-10-CM | POA: Diagnosis present

## 2020-05-23 DIAGNOSIS — Z89511 Acquired absence of right leg below knee: Secondary | ICD-10-CM | POA: Diagnosis not present

## 2020-05-23 DIAGNOSIS — E039 Hypothyroidism, unspecified: Secondary | ICD-10-CM | POA: Diagnosis present

## 2020-05-23 DIAGNOSIS — E559 Vitamin D deficiency, unspecified: Secondary | ICD-10-CM | POA: Diagnosis present

## 2020-05-23 DIAGNOSIS — Z841 Family history of disorders of kidney and ureter: Secondary | ICD-10-CM

## 2020-05-23 DIAGNOSIS — I69391 Dysphagia following cerebral infarction: Secondary | ICD-10-CM | POA: Diagnosis not present

## 2020-05-23 DIAGNOSIS — I482 Chronic atrial fibrillation, unspecified: Secondary | ICD-10-CM | POA: Diagnosis present

## 2020-05-23 DIAGNOSIS — Z955 Presence of coronary angioplasty implant and graft: Secondary | ICD-10-CM

## 2020-05-23 DIAGNOSIS — Z7901 Long term (current) use of anticoagulants: Secondary | ICD-10-CM

## 2020-05-23 DIAGNOSIS — Z7952 Long term (current) use of systemic steroids: Secondary | ICD-10-CM

## 2020-05-23 DIAGNOSIS — Z7189 Other specified counseling: Secondary | ICD-10-CM | POA: Diagnosis not present

## 2020-05-23 DIAGNOSIS — I4891 Unspecified atrial fibrillation: Secondary | ICD-10-CM | POA: Diagnosis present

## 2020-05-23 DIAGNOSIS — Z79899 Other long term (current) drug therapy: Secondary | ICD-10-CM

## 2020-05-23 DIAGNOSIS — I10 Essential (primary) hypertension: Secondary | ICD-10-CM | POA: Diagnosis not present

## 2020-05-23 DIAGNOSIS — G40209 Localization-related (focal) (partial) symptomatic epilepsy and epileptic syndromes with complex partial seizures, not intractable, without status epilepticus: Secondary | ICD-10-CM | POA: Diagnosis present

## 2020-05-23 DIAGNOSIS — R4781 Slurred speech: Secondary | ICD-10-CM | POA: Diagnosis present

## 2020-05-23 DIAGNOSIS — Z79890 Hormone replacement therapy (postmenopausal): Secondary | ICD-10-CM

## 2020-05-23 DIAGNOSIS — I739 Peripheral vascular disease, unspecified: Secondary | ICD-10-CM | POA: Diagnosis present

## 2020-05-23 DIAGNOSIS — D638 Anemia in other chronic diseases classified elsewhere: Secondary | ICD-10-CM | POA: Diagnosis not present

## 2020-05-23 DIAGNOSIS — J690 Pneumonitis due to inhalation of food and vomit: Secondary | ICD-10-CM | POA: Diagnosis not present

## 2020-05-23 DIAGNOSIS — Z20822 Contact with and (suspected) exposure to covid-19: Secondary | ICD-10-CM | POA: Diagnosis present

## 2020-05-23 DIAGNOSIS — R4701 Aphasia: Secondary | ICD-10-CM | POA: Diagnosis present

## 2020-05-23 DIAGNOSIS — R5383 Other fatigue: Secondary | ICD-10-CM | POA: Diagnosis not present

## 2020-05-23 DIAGNOSIS — G40909 Epilepsy, unspecified, not intractable, without status epilepticus: Secondary | ICD-10-CM | POA: Diagnosis present

## 2020-05-23 DIAGNOSIS — N183 Chronic kidney disease, stage 3 unspecified: Secondary | ICD-10-CM | POA: Diagnosis present

## 2020-05-23 DIAGNOSIS — Z515 Encounter for palliative care: Secondary | ICD-10-CM | POA: Diagnosis not present

## 2020-05-23 DIAGNOSIS — Z66 Do not resuscitate: Secondary | ICD-10-CM | POA: Diagnosis not present

## 2020-05-23 DIAGNOSIS — G8191 Hemiplegia, unspecified affecting right dominant side: Secondary | ICD-10-CM | POA: Diagnosis not present

## 2020-05-23 LAB — DIFFERENTIAL
Abs Immature Granulocytes: 0.18 10*3/uL — ABNORMAL HIGH (ref 0.00–0.07)
Basophils Absolute: 0 10*3/uL (ref 0.0–0.1)
Basophils Relative: 0 %
Eosinophils Absolute: 0.1 10*3/uL (ref 0.0–0.5)
Eosinophils Relative: 1 %
Immature Granulocytes: 3 %
Lymphocytes Relative: 16 %
Lymphs Abs: 1.1 10*3/uL (ref 0.7–4.0)
Monocytes Absolute: 0.6 10*3/uL (ref 0.1–1.0)
Monocytes Relative: 8 %
Neutro Abs: 5.1 10*3/uL (ref 1.7–7.7)
Neutrophils Relative %: 72 %

## 2020-05-23 LAB — ECHOCARDIOGRAM COMPLETE
AR max vel: 2.57 cm2
AV Area VTI: 2.87 cm2
AV Area mean vel: 2.43 cm2
AV Mean grad: 2 mmHg
AV Peak grad: 3.5 mmHg
Ao pk vel: 0.94 m/s
Height: 69.016 in
S' Lateral: 2.2 cm
Weight: 2472.68 oz

## 2020-05-23 LAB — PROTIME-INR
INR: 1.2 (ref 0.8–1.2)
Prothrombin Time: 14.4 seconds (ref 11.4–15.2)

## 2020-05-23 LAB — CBC
HCT: 33.5 % — ABNORMAL LOW (ref 39.0–52.0)
Hemoglobin: 10.5 g/dL — ABNORMAL LOW (ref 13.0–17.0)
MCH: 32.6 pg (ref 26.0–34.0)
MCHC: 31.3 g/dL (ref 30.0–36.0)
MCV: 104 fL — ABNORMAL HIGH (ref 80.0–100.0)
Platelets: 123 10*3/uL — ABNORMAL LOW (ref 150–400)
RBC: 3.22 MIL/uL — ABNORMAL LOW (ref 4.22–5.81)
RDW: 17.4 % — ABNORMAL HIGH (ref 11.5–15.5)
WBC: 7.1 10*3/uL (ref 4.0–10.5)
nRBC: 0.3 % — ABNORMAL HIGH (ref 0.0–0.2)

## 2020-05-23 LAB — I-STAT CHEM 8, ED
BUN: 38 mg/dL — ABNORMAL HIGH (ref 8–23)
Calcium, Ion: 1.07 mmol/L — ABNORMAL LOW (ref 1.15–1.40)
Chloride: 104 mmol/L (ref 98–111)
Creatinine, Ser: 1.4 mg/dL — ABNORMAL HIGH (ref 0.61–1.24)
Glucose, Bld: 152 mg/dL — ABNORMAL HIGH (ref 70–99)
HCT: 30 % — ABNORMAL LOW (ref 39.0–52.0)
Hemoglobin: 10.2 g/dL — ABNORMAL LOW (ref 13.0–17.0)
Potassium: 4.3 mmol/L (ref 3.5–5.1)
Sodium: 140 mmol/L (ref 135–145)
TCO2: 24 mmol/L (ref 22–32)

## 2020-05-23 LAB — COMPREHENSIVE METABOLIC PANEL
ALT: 17 U/L (ref 0–44)
AST: 27 U/L (ref 15–41)
Albumin: 3.5 g/dL (ref 3.5–5.0)
Alkaline Phosphatase: 57 U/L (ref 38–126)
Anion gap: 11 (ref 5–15)
BUN: 36 mg/dL — ABNORMAL HIGH (ref 8–23)
CO2: 23 mmol/L (ref 22–32)
Calcium: 8.3 mg/dL — ABNORMAL LOW (ref 8.9–10.3)
Chloride: 106 mmol/L (ref 98–111)
Creatinine, Ser: 1.39 mg/dL — ABNORMAL HIGH (ref 0.61–1.24)
GFR, Estimated: 50 mL/min — ABNORMAL LOW (ref >60)
Glucose, Bld: 153 mg/dL — ABNORMAL HIGH (ref 70–99)
Potassium: 4.4 mmol/L (ref 3.5–5.1)
Sodium: 140 mmol/L (ref 135–145)
Total Bilirubin: 0.6 mg/dL (ref 0.3–1.2)
Total Protein: 5.5 g/dL — ABNORMAL LOW (ref 6.5–8.1)

## 2020-05-23 LAB — VALPROIC ACID LEVEL: Valproic Acid Lvl: 41 ug/mL — ABNORMAL LOW (ref 50.0–100.0)

## 2020-05-23 LAB — APTT: aPTT: 29 seconds (ref 24–36)

## 2020-05-23 LAB — RESP PANEL BY RT-PCR (FLU A&B, COVID) ARPGX2
Influenza A by PCR: NEGATIVE
Influenza B by PCR: NEGATIVE
SARS Coronavirus 2 by RT PCR: NEGATIVE

## 2020-05-23 LAB — CBG MONITORING, ED: Glucose-Capillary: 140 mg/dL — ABNORMAL HIGH (ref 70–99)

## 2020-05-23 LAB — MRSA PCR SCREENING: MRSA by PCR: NEGATIVE

## 2020-05-23 LAB — GLUCOSE, CAPILLARY: Glucose-Capillary: 89 mg/dL (ref 70–99)

## 2020-05-23 MED ORDER — MORPHINE SULFATE (PF) 2 MG/ML IV SOLN
2.0000 mg | Freq: Once | INTRAVENOUS | Status: AC
Start: 2020-05-23 — End: 2020-05-23
  Administered 2020-05-23: 2 mg via INTRAVENOUS
  Filled 2020-05-23: qty 1

## 2020-05-23 MED ORDER — PREDNISONE 1 MG PO TABS
4.0000 mg | ORAL_TABLET | Freq: Every day | ORAL | Status: DC
  Administered 2020-05-24 – 2020-05-25 (×2): 4 mg via ORAL
  Filled 2020-05-23 (×4): qty 4

## 2020-05-23 MED ORDER — ATORVASTATIN CALCIUM 80 MG PO TABS
80.0000 mg | ORAL_TABLET | Freq: Every day | ORAL | Status: DC
  Administered 2020-05-24 – 2020-05-26 (×3): 80 mg via ORAL
  Filled 2020-05-23: qty 1
  Filled 2020-05-23 (×2): qty 8

## 2020-05-23 MED ORDER — VALPROATE SODIUM 100 MG/ML IV SOLN
250.0000 mg | Freq: Two times a day (BID) | INTRAVENOUS | Status: DC
  Administered 2020-05-23: 250 mg via INTRAVENOUS
  Filled 2020-05-23 (×3): qty 2.5

## 2020-05-23 MED ORDER — IOHEXOL 350 MG/ML SOLN
75.0000 mL | Freq: Once | INTRAVENOUS | Status: AC | PRN
  Administered 2020-05-23: 75 mL via INTRAVENOUS

## 2020-05-23 MED ORDER — SODIUM CHLORIDE 0.9 % IV SOLN
50.0000 mL/h | INTRAVENOUS | Status: DC
  Administered 2020-05-23 (×2): 50 mL/h via INTRAVENOUS

## 2020-05-23 MED ORDER — ACETAMINOPHEN 650 MG RE SUPP: 650.0000 mg | RECTAL | Status: DC | PRN

## 2020-05-23 MED ORDER — DIVALPROEX SODIUM ER 250 MG PO TB24
250.0000 mg | ORAL_TABLET | Freq: Two times a day (BID) | ORAL | Status: DC
  Filled 2020-05-23 (×2): qty 1

## 2020-05-23 MED ORDER — ALTEPLASE (STROKE) FULL DOSE INFUSION
0.9000 mg/kg | Freq: Once | INTRAVENOUS | Status: AC
  Administered 2020-05-23: 63.1 mg via INTRAVENOUS
  Filled 2020-05-23: qty 100

## 2020-05-23 MED ORDER — ACETAMINOPHEN 325 MG PO TABS: 650.0000 mg | ORAL_TABLET | ORAL | Status: DC | PRN

## 2020-05-23 MED ORDER — PANTOPRAZOLE SODIUM 40 MG IV SOLR
40.0000 mg | Freq: Every day | INTRAVENOUS | Status: DC
  Administered 2020-05-23: 40 mg via INTRAVENOUS
  Filled 2020-05-23: qty 40

## 2020-05-23 MED ORDER — STROKE: EARLY STAGES OF RECOVERY BOOK
Freq: Once | Status: AC
  Filled 2020-05-23: qty 1

## 2020-05-23 MED ORDER — SODIUM CHLORIDE 0.9 % IV SOLN
50.0000 mL | Freq: Once | INTRAVENOUS | Status: AC
  Administered 2020-05-23: 50 mL via INTRAVENOUS

## 2020-05-23 MED ORDER — PERFLUTREN LIPID MICROSPHERE
1.0000 mL | INTRAVENOUS | Status: AC | PRN
  Administered 2020-05-23: 5 mL via INTRAVENOUS
  Filled 2020-05-23: qty 10

## 2020-05-23 MED ORDER — DIVALPROEX SODIUM ER 250 MG PO TB24
250.0000 mg | ORAL_TABLET | Freq: Two times a day (BID) | ORAL | Status: DC
  Administered 2020-05-24: 250 mg via ORAL
  Filled 2020-05-23: qty 1

## 2020-05-23 MED ORDER — LEVOTHYROXINE SODIUM 75 MCG PO TABS
150.0000 ug | ORAL_TABLET | Freq: Every day | ORAL | Status: DC
  Administered 2020-05-25 – 2020-05-26 (×2): 150 ug via ORAL
  Filled 2020-05-23: qty 2

## 2020-05-23 MED ORDER — PREDNISONE 1 MG PO TABS
9.0000 mg | ORAL_TABLET | Freq: Every day | ORAL | Status: DC
Start: 2020-05-24 — End: 2020-05-23

## 2020-05-23 MED ORDER — ACETAMINOPHEN 160 MG/5ML PO SOLN: 650.0000 mg | ORAL | Status: DC | PRN

## 2020-05-23 NOTE — Progress Notes (Signed)
EEG completed, results pending. 

## 2020-05-23 NOTE — H&P (Signed)
Neurology H&P  CC: code stroke  History is obtained from: EMS, wife, chart  HPI: [name] is a 87 y.o. male with a PMHx of PAD with chronic wound s/p right BKA in 2020, CAD s/p stent 2002, CHF, AF off AC, complex partial seizure disorder, hypothyroidism, HLD, HTN, duodenal ulcer with GIB 2021, CKD III, and dementia. Per chart, patient is a retired Software engineer who self d/c'd his Eliquis in 2020 due to a leg hematoma.   Per EMS and wife, patient woke up in his usual state of health at 0800 hours and was eating breakfast with wife. Patient knocked his milk over then tilted his head and just started staring. Then, the wife heard some gurgling and moaning and noticed his oatmeal was leaking from his mouth. The patient was unable to speak at that point. She was afraid he was choking as he has had aspiration PNA x 3 in the past, but he just wasn't moving, which was odd if he was trying to prevent choking. She called 911. EMS reported patient had right arm weakness, left gaze preference and was not verbal on their arrival. EMS did not notice any seizure like behavior. Patient was brought in to [facility] ED as a code stroke and taken to [place].   Suspicion due to his presenting symptoms for LVO. CT head, CTA head and neck and MRI brain performed. No LVO. Decision was made for tPA due to high suspicion of stroke.   In review of chart, patient presented to [facility] after being found down in a closet slumped over his walker in 2017. Multiple seizures were witnessed. At that time, patient was off his Keppra. EEG showed potential area of epileptogenicity in the left hemisphere but no seizure. Patient was discharged on Depakote 750mg  po bid then followed up with Dr. Jannifer Franklin at [facility]. Due to daytime somulence, his Depakote was changed to 500mg  po in am and 1000mg  po qhs.   NP saw no further out patient notes after above, but patient may have changed practices. There was a tele neuro consult in 2020 in ED for  stroke vs seizure. Note says that his Depakote had recently been decreased due to well controlled seizures. During that stay, MRI brain showed a punctate acute infarction in the right cerebellum. He was discharged on ASA 325mg  po qd (no AC due to previous leg hematoma).   There is an out patient neuro visit to Dr. Charlotte Crumb on 08/2018. Samples of Vimpat were given at that time due to drowsiness with Depakote and plan was to wean Depakote when therapeutic Vimpat dose reached. ASA 81mg  po qd was on med list at that visit, not 325mg . After 08/2018 visit, NP sees no other out patient f/up with neurology in [place]. Spoke to wife and patient did not continue Vimpat and was put back on Depakote.    LKW: 0800 tpa given?: yes IR Thrombectomy? No, no LVO MRS 3  NIHSS:  1a Level of Conscious: 0 1b LOC Questions: 2 1c LOC Commands: 1 2 Best Gaze: 1 3 Visual: 0 4 Facial Palsy: 2 5a Motor Arm - left: 0 5b Motor Arm - Right: 3 6a Motor Leg - Left: 0 6b Motor Leg - Right: 0 7 Limb Ataxia: 2 8 Sensory: 1 9 Best Language: 2 10 Dysarthria: 1 11 Extinct. and Inatten: 1 TOTAL: 16  ROS: Unable to assess due to emergent nature of event and due to altered mental status.   Past Medical History:  Diagnosis  Date  . Abnormality of gait 09/22/2015  . Arthritis   . Atrial fibrillation ([place])   . CAD (coronary artery disease)    Stent to RCA, Penta stent, 99% reduced to 0% 2002.  . Cancer ([place])    skin CA removed from back  . 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 Chronic insomnia 03/30/2015  . Chronic kidney disease, stage 3 ([place])   . Complication of anesthesia    trouble waking up  . GERD (gastroesophageal reflux disease)   . Hypercholesteremia   . Hypertension   . Hypothyroidism   . Iron deficiency anemia   . Memory difficulty 09/22/2015  . Osteoarthritis   . PAD (peripheral artery disease) ([place])   . Pneumonia   . Seizures ([place])   . Sepsis ([place]) 05/2017  . Transient  alteration of awareness 03/30/2015  . Vertigo    hx of  . Vitamin D deficiency     Family History  Problem Relation Age of Onset  . Hypertension Mother   . Cancer Mother   . Kidney failure Father   . Heart disease Father     Social History:  reports that he has quit smoking. He has a 5.00 pack-year smoking history. He has never used smokeless tobacco. He reports previous alcohol use. He reports that he does not use drugs.   Prior to Admission medications   Medication Sig Start Date End Date Taking? Authorizing Provider  alendronate (FOSAMAX) 70 MG tablet Take 70 mg by mouth every Wednesday. Remain upright for 30-60 minutes. 01/06/12   [provider]  aspirin EC 81 MG tablet Take 81 mg by mouth daily.    [provider]  Calcium Carb-Cholecalciferol (CALCIUM 600+D) 600-800 MG-UNIT TABS Take 1 tablet by mouth daily.    [provider]  diltiazem (CARDIZEM CD) 240 MG 24 hr capsule TAKE 1 CAPSULE BY MOUTH EVERY MORNING. HOLD IF SYSTOLIC BLOOD PRESSURE IS LESS THAN 100 Patient taking differently: Take 240 mg by mouth daily. 12/10/19   Croitoru, Mihai, MD  divalproex (DEPAKOTE ER) 250 MG 24 hr tablet Take 250 mg by mouth in the morning and at bedtime.     [provider]  doxazosin (CARDURA) 4 MG tablet TAKE 1 TABLET(4 MG) BY MOUTH AT BEDTIME Patient taking differently: Take 4 mg by mouth at bedtime. 12/17/19   Croitoru, Mihai, MD  doxycycline (VIBRA-TABS) 100 MG tablet Take 1 tablet (100 mg total) by mouth 2 (two) times daily. Patient not taking: Reported on 04/26/2020 01/18/20   Newt Minion, MD  furosemide (LASIX) 20 MG tablet Take 1 tablet (20 mg total) by mouth daily. 07/20/19 01/06/29  Croitoru, Mihai, MD  HYDROcodone-acetaminophen (NORCO) 10-325 MG tablet Take 1 tablet by mouth every 4 (four) hours as needed for pain. 06/23/19   [provider]  levothyroxine (SYNTHROID, LEVOTHROID) 150 MCG tablet Take 150 mcg by mouth daily before breakfast.  12/27/11   [provider]  metoprolol tartrate (LOPRESSOR) 25 MG tablet TAKE 1 TABLET BY MOUTH TWICE DAILY WITH A MEAL OR IMMEDIATELY AFTER 05/02/20   Croitoru, Mihai, MD  Multiple Vitamins-Minerals (CERTAVITE/ANTIOXIDANTS) TABS Take 1 tablet by mouth every evening.     [provider]  mupirocin ointment (BACTROBAN) 2 % Apply 1 application topically 2 (two) times daily. Apply to the affected area 2 times a day 02/29/20   Newt Minion, MD  nitrofurantoin (MACRODANTIN) 100 MG capsule Take 100 mg by mouth daily. 09/30/19   [provider]  nitroGLYCERIN (NITROSTAT) 0.4 MG SL tablet Place 1 tablet (0.4 mg total) under the tongue every 5 (five) minutes as needed for chest pain (call 911 afer 3 doses and if chest pain persists). 06/19/18   Croitoru, Mihai, MD  pantoprazole (PROTONIX) 40 MG tablet Take 1 tablet (40 mg total) by mouth 2 (two) times daily. 01/11/20 02/10/20  Darliss Cheney, MD  pentoxifylline (TRENTAL) 400 MG CR tablet TAKE 1 TABLET(400 MG) BY MOUTH THREE TIMES DAILY WITH MEALS 04/04/20   Persons, Bevely Palmer, PA  polyethylene glycol (MIRALAX / GLYCOLAX) 17 g packet Take 17 g by mouth at bedtime.    [provider]  potassium chloride SA (KLOR-CON) 20 MEQ tablet Take 0.5 tablets (10 mEq total) by mouth daily. 10/22/19   Croitoru, Mihai, MD  pravastatin (PRAVACHOL) 40 MG tablet Take 40 mg by mouth every evening.  10/29/14   [provider]  predniSONE (DELTASONE) 1 MG tablet Take 9 mg by mouth daily. 06/17/19   [provider]  sulfamethoxazole-trimethoprim (BACTRIM DS) 800-160 MG tablet Take 1 tablet by mouth 2 (two) times daily. Patient not taking: Reported on 04/26/2020 02/29/20   Newt Minion, MD  sulfamethoxazole-trimethoprim (BACTRIM DS) 800-160 MG tablet Take 1 tablet by mouth 2 (two) times daily. Patient not taking: Reported on 04/26/2020 04/05/20   Newt Minion, MD    Exam: Current vital signs: BP (!) 155/99 (BP Location: Left Arm)    Pulse (!) 105   Resp 16   Physical Exam  Constitutional: Appears well-developed and well-nourished.  Psych: Affect appropriate to situation Eyes: No scleral injection HENT: No OP obstrucion Head: Normocephalic.  Cardiovascular: Normal rate and regular rhythm.  Respiratory: Effort normal and breath sounds normal to anterior ascultation GI: Soft.  No distension. There is no tenderness.  Skin: WDI. Purplish discoloration to BUEs.   Neuro: Mental Status: Patient is awake, alert. Unable to answer orientation questions on first exam due to expressive aphasia. Patient is unable to give a clear and coherent history. Positive expressive aphasia and right sided neglect on arrival. Later, is able to say his name and answer a few questions with one to three word answers. Speech is not fluent. Can not name objects or repeat sentence. Comprehension is intact.  Cranial Nerves: II: Visual Fields are full. Left gaze preference on arrival, later overcome. Pupils are equal, round, and reactive to light. No blink to threat originally on right.  III,IV, VI: EOMI without ptosis or diploplia.  V: Facial sensation is symmetric to temperature VII: Facial movement is symmetric.  VIII: hearing is intact to voice X: Uvula elevates symmetrically XI: Shoulder shrug is symmetric. XII: tongue is midline without atrophy or fasciculations.  Motor: Tone is normal. Bulk is normal. Strength right grip 4/5, right biceps 3+/5, right triceps 4/5. LLE/LUE 5/5.  Sensory: Sensation is symmetric to light touch and temperature in the arms and legs.  Plantars: Toes are downgoing bilaterally.  Cerebellar: FNF intact.  MD reviewed the images obtained:  NCT head  1. No acute cortically based infarct or acute intracranial hemorrhage identified. ASPECTS 10. 2. Chronic white matter disease appears stable since 2020.  CTA head and neck  1. Negative for emergent large vessel occlusion. Left MCA branches appear stable from a  2020 CTA. This was reviewed in person with Dr. Roland Rack at [phone] hours. 2. Positive for chronic arterial tortuosity and calcified plaque in the head and neck. Chronic moderate to severe bilateral vertebral artery stenoses: - Severe Right  Vertebral Artery origin and V4 segment. - Moderate Left Vertebral V4 segment. 3. Chronic very severe cervical spine degeneration. 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  MRI brain 1. Patchy abnormal diffusion at the left perirolandic cortex most resembling acute ischemia. No T2 FLAIR changes or hemorrhage, mass effect at this time. This was discussed in person with Dr. Roland Rack at [phone] hours. 2. Otherwise stable chronic small vessel disease since the 2020 MRI. 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  Assessment: 87 yo male who presented as a code stroke due to aphasia, right sided weakness, left gaze preference. No LVO on imaging. + acute ischemia noted left perirolandic cortex on MRI. tPA was given. Patient's presenting symptoms beginning to resolve somewhat during and after tPA. NP alerted RN to left arm purplish hematoma at bedside post tPA. CVA risk factors include HLD, PAD, Afib, and CAD. Patient does have a hx of seizures on Depakote, but description of this incident is not consistent with his seizure presentations in the past. His VA level is low, but he is on a low dose. Monitor for seizures.   Impression:  1. CVA 2. Seizure disorder  Plan: -admit to ICU post tPA order set.  -strict bedrest. -bleeding precautions. -seizure precautions.  -SCDs as VTE -CT head 24 hours after tPA.  -TTE. - HbA1c, lipid panel, TSH. - Recommend Statin or increase dose if LDL > 70 -no anticoagulation or anti platelets for 24 hours post tPA. Defer to stroke team after [facility] results tomorrow.  - SBP goal-goal for 24 hours post tPA is no  higher than 180/105.  -Telemetry monitoring for arrhythmia. - frequent neuro checks - NIHSS per protocol - Recommend bedside Swallow screen. - Recommend Stroke education. - Recommend PT/OT/SLP consult. - Continue Depakote at home dose.  - cough with hx aspiration-check CXR  Electronically signed by: Clance Boll, MSN, APN-BC, nurse practitioner and by MD. Note/plan to be edited by MD as needed.  Pager: [phone]  I have seen the patient and was present for the entirety of the evaluation and management reflected by the above note..  The patient has expressed a wish to be DNR on previous admissions, and I had a discussion with his wife who states that his wishes would be to be DNR in the setting of cardiac arrest, but full support until that point.  He presented with sudden onset right-sided weakness and aphasia.  He was already improving on arrival to [facility], but still had significant deficits.  He remembers the entirety of the event.  He had no convulsive activity.  Given his history of seizures, after a CTA was negative with an exam suggestive of large vessel occlusion, seizure was considered as a possible etiology and he was taken for an emergent MRI.  This does demonstrate areas of ischemia in the posterior region of the MCA, and I suspect that he had a larger embolus that broke up and went distally.  The MRI does seem to suggest some white matter involvement as well as cortex, so I think seizure as an etiology for the restricted diffusion is significantly less likely.  Given that he still has a disabling deficit and is within the window for IV TPA, I discussed this medication with his wife who agreed.   This patient is critically ill and at significant risk of neurological worsening, death and care requires constant monitoring of vital signs, hemodynamics,respiratory and cardiac monitoring, neurological assessment, discussion  with family, other specialists and medical decision making  of high complexity. I spent 50 minutes of neurocritical care time  in the care of  this patient. This was time spent independent of any time provided by nurse practitioner or PA.  Roland Rack, MD [facility] [phone]  If 7pm- 7am, please page neurology on call as listed in [place]. 05/23/2020  7:38 PM

## 2020-05-23 NOTE — Progress Notes (Signed)
PHARMACIST CODE STROKE RESPONSE  Notified to mix tPA at 9:28a by Dr. Leonel Ramsay Delivered tPA to RN at 9:32a  tPA dose = 6.3 mg bolus over 1 minute followed by 56.8 mg for a total dose of 63.1 mg over 1 hour  Issues/delays encountered (if applicable): N/A  Romilda Garret, PharmD PGY1 Acute Care Pharmacy Resident 05/23/2020 9:53 AM  Please check [email or web address] for unit specific pharmacy phone numbers.

## 2020-05-23 NOTE — ED Triage Notes (Signed)
Pt BIB [facility] from home c/o CODE STROKE. Pt was at home eating oatmeal with his wife and became altered with food in his mouth. Fire arrived first and did sweep oatmeal out of pt's mouth. Pt with right sided weakness and aphasia.

## 2020-05-23 NOTE — ED Provider Notes (Signed)
[facility] EMERGENCY DEPARTMENT Provider Note   CSN: [id number] Arrival date & time: 05/23/20  0841     History No chief complaint on file.   [name] is a 87 y.o. male.  HPI    Level 5 due to acuity 87 year old man history of seizure disorder, CKD, CAD, A. fib presents via EMS as code stroke.  I evaluated patient first while he was in [place].  Notes, my history is obtained mainly from Dr. Leonel Ramsay.  Wife reports that he was in his normal state until 8 AM.  While he was at the table at 8 AM his speech became garbled and he had right-sided facial droop and right-sided weakness.  This has continued at this time.  There is no reports that he is on blood thinner.  There is no reports that he had a seizure although his seizures have been reported as subclinical in the past.  He is on Depakote.  At baseline patient needs assistance getting in and out of bed due to a right leg amputation from peripheral vascular disease  Past Medical History:  Diagnosis Date  . Abnormality of gait 09/22/2015  . Arthritis   . Atrial fibrillation ([place])   . CAD (coronary artery disease)    Stent to RCA, Penta stent, 99% reduced to 0% 2002.  . Cancer ([place])    skin CA removed from back  . 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 Chronic insomnia 03/30/2015  . Chronic kidney disease, stage 3 ([place])   . Complication of anesthesia    trouble waking up  . GERD (gastroesophageal reflux disease)   . Hypercholesteremia   . Hypertension   . Hypothyroidism   . Iron deficiency anemia   . Memory difficulty 09/22/2015  . Osteoarthritis   . PAD (peripheral artery disease) ([place])   . Pneumonia   . Seizures ([place])   . Sepsis ([place]) 05/2017  . Transient alteration of awareness 03/30/2015  . Vertigo    hx of  . Vitamin D deficiency     Patient Active Problem List   Diagnosis Date Noted  . Acute blood loss anemia   . Atypical chest pain   . Chronic duodenal ulcer  with hemorrhage   . Gastrointestinal hemorrhage 01/07/2020  . Decreased hemoglobin   . Permanent atrial fibrillation ([place]) 10/24/2019  . Hypercholesterolemia 10/24/2019  . Current chronic use of systemic steroids 10/24/2019  . Macrocytic anemia 10/24/2019  . Dehiscence of amputation stump ([place])   . Wound infection 05/09/2018  . AKI (acute kidney injury) ([place])   . S/P BKA (below knee amputation) unilateral, right ([place])   . Below-knee amputation with complication, initial encounter ([place])   . Tachypnea   . Post-operative pain   . Subacute osteomyelitis, right ankle and foot ([place])   . Paralysis of right lower extremity ([place])   . TIA (transient ischemic attack) 03/24/2018  . Encephalopathy 03/24/2018  . Cellulitis 03/15/2018  . Hypernatremia 03/15/2018  . Persistent atrial fibrillation ([place]) 11/10/2017  . Severe muscle deconditioning 11/10/2017  . Hemorrhage 10/22/2017  . Coronary artery disease involving native coronary artery of native heart without angina pectoris 10/22/2017  . Acute hypoxemic respiratory failure ([place]) 07/17/2017  . Aspiration syndrome, subsequent encounter 07/11/2017  . Aspiration pneumonia ([place]) 07/01/2017  . Acute encephalopathy 07/01/2017  . Thrombocytopenia ([place]) 07/01/2017  . Chronic diastolic (congestive) heart failure ([place]) 07/01/2017  . Anemia of chronic disease 07/01/2017  . Elevated troponin 07/01/2017  .  Chronic atrial fibrillation 07/01/2017  . Goals of care, counseling/discussion   . Palliative care by specialist   . Acute metabolic encephalopathy 62/37/6283  . Stage 3a chronic kidney disease   . CAP (community acquired pneumonia) 04/17/2017  . Hallux rigidus, right foot 03/10/2016  . Lumbosacral spondylosis without myelopathy 10/29/2015  . Memory difficulty 09/22/2015  . Abnormality of gait 09/22/2015  . Hyperglycemia 07/06/2015  . Chronic pain 07/06/2015  . Chronic insomnia 03/30/2015  . Transient alteration of awareness 03/30/2015  .  Abnormal liver function   . Altered mental status 01/31/2015  . Essential hypertension 01/31/2015  . Constipation 01/31/2015  . Hypothyroidism 01/31/2015  . Seizure disorder ([place]) 01/31/2015  . Bladder outlet obstruction 01/31/2015  . GERD (gastroesophageal reflux disease) 01/31/2015  . Chronic back pain 01/31/2015  . Acute kidney injury ([place]) 01/31/2015    Past Surgical History:  Procedure Laterality Date  . AMPUTATION Right 03/28/2018   Procedure: AMPUTATION BELOW KNEE;  Surgeon: Newt Minion, MD;  Location: [place];  Service: Orthopedics;  Laterality: Right;  . APPLICATION OF WOUND VAC Right 01/23/2019   Procedure: Application Of  Prevena Wound Vac;  Surgeon: Newt Minion, MD;  Location: [place];  Service: Orthopedics;  Laterality: Right;  . BACK SURGERY    . BIOPSY  01/08/2020   Procedure: BIOPSY;  Surgeon: Gatha Mayer, MD;  Location: [place];  Service: Endoscopy;;  . ESOPHAGOGASTRODUODENOSCOPY (EGD) WITH PROPOFOL N/A 01/08/2020   Procedure: ESOPHAGOGASTRODUODENOSCOPY (EGD) WITH PROPOFOL;  Surgeon: Gatha Mayer, MD;  Location: [place];  Service: Endoscopy;  Laterality: N/A;  . EYE SURGERY     Bilateral Cataract surgery   . HERNIA REPAIR    . I & D EXTREMITY Right 05/10/2015   Procedure: IRRIGATION AND DEBRIDEMENT EXTREMITY;  Surgeon: Leanora Cover, MD;  Location: [place];  Service: Orthopedics;  Laterality: Right;  . KNEE ARTHROPLASTY     right knee X 2; left knee once  . LAMINECTOMY     X 6  . LEG AMPUTATION BELOW KNEE Right 03/28/2018  . POSTERIOR CERVICAL FUSION/FORAMINOTOMY  01/28/2012   Procedure: POSTERIOR CERVICAL FUSION/FORAMINOTOMY LEVEL 3;  Surgeon: [name], MD;  Location: [place] [facility];  Service: Neurosurgery;  Laterality: Left;  Posterior Cervical Five-Thoracic One Fusion, Arthrodesis with LEFT Cervical Seven-thoracic One Laminectomy, Foraminotomy and Resection of Synovial Cyst  . POSTERIOR CERVICAL FUSION/FORAMINOTOMY N/A  01/29/2013   Procedure: POSTERIOR CERVICAL FUSION/FORAMINOTOMY LEVEL 1 and C2-5 Posteriolateral Arthrodesis;  Surgeon: [name], MD;  Location: [place] [facility];  Service: Neurosurgery;  Laterality: N/A;  C2-C3 Laminectomy C2-C3 posterior cervical arthrodesis  . STUMP REVISION Right 01/23/2019   Procedure: REVISION RIGHT BELOW KNEE AMPUTATION;  Surgeon: Newt Minion, MD;  Location: [place];  Service: Orthopedics;  Laterality: Right;  . TONSILLECTOMY         Family History  Problem Relation Age of Onset  . Hypertension Mother   . Cancer Mother   . Kidney failure Father   . Heart disease Father     Social History   Tobacco Use  . Smoking status: Former Smoker    Packs/day: 1.00    Years: 5.00    Pack years: 5.00  . Smokeless tobacco: Never Used  Vaping Use  . Vaping Use: Never used  Substance Use Topics  . Alcohol use: Not Currently    Comment: rare  . Drug use: No    Home Medications Prior to Admission medications   Medication Sig Start  Date End Date Taking? Authorizing Provider  alendronate (FOSAMAX) 70 MG tablet Take 70 mg by mouth every Wednesday. Remain upright for 30-60 minutes. 01/06/12   [provider]  aspirin EC 81 MG tablet Take 81 mg by mouth daily.    [provider]  Calcium Carb-Cholecalciferol (CALCIUM 600+D) 600-800 MG-UNIT TABS Take 1 tablet by mouth daily.    [provider]  diltiazem (CARDIZEM CD) 240 MG 24 hr capsule TAKE 1 CAPSULE BY MOUTH EVERY MORNING. HOLD IF SYSTOLIC BLOOD PRESSURE IS LESS THAN 100 Patient taking differently: Take 240 mg by mouth daily. 12/10/19   Croitoru, Mihai, MD  divalproex (DEPAKOTE ER) 250 MG 24 hr tablet Take 250 mg by mouth in the morning and at bedtime.     [provider]  doxazosin (CARDURA) 4 MG tablet TAKE 1 TABLET(4 MG) BY MOUTH AT BEDTIME Patient taking differently: Take 4 mg by mouth at bedtime. 12/17/19   Croitoru, Mihai, MD  doxycycline (VIBRA-TABS) 100 MG tablet Take 1  tablet (100 mg total) by mouth 2 (two) times daily. Patient not taking: Reported on 04/26/2020 01/18/20   Newt Minion, MD  furosemide (LASIX) 20 MG tablet Take 1 tablet (20 mg total) by mouth daily. 07/20/19 01/06/29  Croitoru, Mihai, MD  HYDROcodone-acetaminophen (NORCO) 10-325 MG tablet Take 1 tablet by mouth every 4 (four) hours as needed for pain. 06/23/19   [provider]  levothyroxine (SYNTHROID, LEVOTHROID) 150 MCG tablet Take 150 mcg by mouth daily before breakfast. 12/27/11   [provider]  metoprolol tartrate (LOPRESSOR) 25 MG tablet TAKE 1 TABLET BY MOUTH TWICE DAILY WITH A MEAL OR IMMEDIATELY AFTER 05/02/20   Croitoru, Mihai, MD  Multiple Vitamins-Minerals (CERTAVITE/ANTIOXIDANTS) TABS Take 1 tablet by mouth every evening.     [provider]  mupirocin ointment (BACTROBAN) 2 % Apply 1 application topically 2 (two) times daily. Apply to the affected area 2 times a day 02/29/20   Newt Minion, MD  nitrofurantoin (MACRODANTIN) 100 MG capsule Take 100 mg by mouth daily. 09/30/19   [provider]  nitroGLYCERIN (NITROSTAT) 0.4 MG SL tablet Place 1 tablet (0.4 mg total) under the tongue every 5 (five) minutes as needed for chest pain (call 911 afer 3 doses and if chest pain persists). 06/19/18   Croitoru, Mihai, MD  pantoprazole (PROTONIX) 40 MG tablet Take 1 tablet (40 mg total) by mouth 2 (two) times daily. 01/11/20 02/10/20  Darliss Cheney, MD  pentoxifylline (TRENTAL) 400 MG CR tablet TAKE 1 TABLET(400 MG) BY MOUTH THREE TIMES DAILY WITH MEALS 04/04/20   Persons, Bevely Palmer, PA  polyethylene glycol (MIRALAX / GLYCOLAX) 17 g packet Take 17 g by mouth at bedtime.    [provider]  potassium chloride SA (KLOR-CON) 20 MEQ tablet Take 0.5 tablets (10 mEq total) by mouth daily. 10/22/19   Croitoru, Mihai, MD  pravastatin (PRAVACHOL) 40 MG tablet Take 40 mg by mouth every evening.  10/29/14   [provider]  predniSONE (DELTASONE) 1 MG tablet  Take 9 mg by mouth daily. 06/17/19   [provider]  sulfamethoxazole-trimethoprim (BACTRIM DS) 800-160 MG tablet Take 1 tablet by mouth 2 (two) times daily. Patient not taking: Reported on 04/26/2020 02/29/20   Newt Minion, MD  sulfamethoxazole-trimethoprim (BACTRIM DS) 800-160 MG tablet Take 1 tablet by mouth 2 (two) times daily. Patient not taking: Reported on 04/26/2020 04/05/20   Newt Minion, MD    Allergies    Eliquis [apixaban], Demerol [meperidine],  Keppra [levetiracetam], and Vimpat [lacosamide]  Review of Systems   Review of Systems  Unable to perform ROS: Acuity of condition    Physical Exam Updated Vital Signs BP (!) 155/99 (BP Location: Left Arm)   Pulse (!) 105   Resp 16   Physical Exam Vitals and nursing note reviewed.   9:46 AM Vitals:   05/23/20 0847  BP: (!) 155/99  Pulse: (!) 105  Resp: 83    Well-developed well-nourished male lying in bed does not appear to be in acute distress Head eyes ears nose throat normocephalic atraumatic masses place neurological exam Eyes pupils are equal round react light extraocular's are intact patient's face is asymmetrical facial droop Tongue pushes Left side JVD no masses, trachea is midline chest wall reveals no obvious signs of trauma but there is a small contusion in the right upper chest wall Heart is regular rate and rhythm no murmurs or gallops appreciated Lungs are clear to auscultation Abdomen is soft and nontender Extremities reveal multiple ecchymosis to be chronic in nature abnormalities or deformities right lower extremity Right lower extremity significant for status post BKA symptoms appear to be some pain abnormalities noted with slight erythema Patient is alert oriented to person place and date.  He has dysarthria He has right-sided upper extremity weakness with decreased grip and decreased ability to raise against gravity Right leg appears to be 5 out of 5 strength with flexion, however due to  BKA is not completely comfortable in the left side No visual field deficits are noted on my exam ED Results / Procedures / Treatments   Labs (all labs ordered are listed, but only abnormal results are displayed) Labs Reviewed  I-STAT CHEM 8, ED - Abnormal; Notable for the following components:      Result Value   BUN 38 (*)    Creatinine, Ser 1.40 (*)    Glucose, Bld 152 (*)    Calcium, Ion 1.07 (*)    Hemoglobin 10.2 (*)    HCT 30.0 (*)    All other components within normal limits  CBG MONITORING, ED - Abnormal; Notable for the following components:   Glucose-Capillary 140 (*)    All other components within normal limits  PROTIME-INR  APTT  CBC  DIFFERENTIAL  COMPREHENSIVE METABOLIC PANEL    EKG None  Radiology CT HEAD CODE STROKE WO CONTRAST  Result Date: 05/23/2020 CLINICAL DATA:  Code stroke.  87 year old male. EXAM: CT HEAD WITHOUT CONTRAST TECHNIQUE: Contiguous axial images were obtained from the base of the skull through the vertex without intravenous contrast. COMPARISON:  Brain MRI 03/25/2018.  Head CT 03/24/2018. FINDINGS: Brain: No acute intracranial hemorrhage identified. No midline shift, mass effect, or evidence of intracranial mass lesion. Stable ventricle size and configuration. Patchy and confluent bilateral white matter hypodensity does not appear significantly changed since 2020. No cortically based acute infarct identified. No cortical encephalomalacia identified. Vascular: Extensive Calcified atherosclerosis at the skull base. No suspicious intracranial vascular hyperdensity. Skull: No acute osseous abnormality identified. Sinuses/Orbits: Visualized paranasal sinuses and mastoids are clear. Other: No acute orbit or scalp soft tissues. ASPECTS [facility] Stroke Program Early CT Score) Total score (0-10 with 10 being normal): 10 IMPRESSION: 1. No acute cortically based infarct or acute intracranial hemorrhage identified. ASPECTS 10. 2. Chronic white matter disease  appears stable since 2020. 3. These results were communicated to Dr. Leonel Ramsay at 9:17 am on 05/23/2020 by text page via the [facility] messaging system. Electronically Signed   By: Herminio Heads.D.  On: 05/23/2020 09:17    Procedures .Critical Care Performed by: Pattricia Boss, MD Authorized by: Pattricia Boss, MD   Critical care provider statement:    Critical care time (minutes):  45   Critical care was necessary to treat or prevent imminent or life-threatening deterioration of the following conditions:  CNS failure or compromise   Critical care was time spent personally by me on the following activities:  Discussions with consultants, evaluation of patient's response to treatment, examination of patient, ordering and performing treatments and interventions, ordering and review of laboratory studies, ordering and review of radiographic studies, pulse oximetry, re-evaluation of patient's condition, obtaining history from patient or surrogate and review of old charts     Medications Ordered in ED Medications - No data to display  ED Course  I have reviewed the triage vital signs and the nursing notes.  Pertinent labs & imaging results that were available during my care of the patient were reviewed by me and considered in my medical decision making (see chart for details).    MDM Rules/Calculators/A&P                          87 year old man presents today with new onset of weakness.  Patient was initiated as code stroke. Patient was seen and comanaged with neurology. Patient was found to have acute stroke and received TPA. Care assumed by neurology and patient admitted to intensive care unit Final Clinical Impression(s) / ED Diagnoses Final diagnoses:  None   Acute stroke Rx / DC Orders ED Discharge Orders    None       Pattricia Boss, MD 05/24/20 1501

## 2020-05-23 NOTE — Code Documentation (Signed)
Patient from home having breakfast with his wife in his normal state of health. At 0800 he developed sudden onset of slurred speech, right-sided weakness and a facial droop. When the fire department arrived they needed to clear the oatmeal from his mouth. [facility] arrived and called the code stroke. BP 210/90. Hx of seizures and PVD, right BKA. Patient taken to [facility] and met by stroke team. CBG 140. Pt taken for CT/CTA. Results were negative for a bleed according to MD. Patient was then taken for MRI to help make treatment decision. NIHSS 22 (see stroke documentation for details) for weakness, facial droop, sensory, right gaze, dysarthria, aphasia, and neglect. Once in MRI symptoms started to improve. MD talked to pt's wife on the phone for consent to start TPA. TPA was mixed with pharmacy at the bedside and SRN started medication at 0935. Pt's BP 159/86 at the start of TPA. Orders were written and pt was transferred to 4N16. Report was given to [place], [place]. SRN started 50cc flush before hand off. RN to resume care with TPA orders. Pt's wife is aware of transfer to floor.    Gulianna Hornsby, Rande Brunt, RN  Stroke Response Nurse

## 2020-05-23 NOTE — Progress Notes (Signed)
  Echocardiogram 2D Echocardiogram has been performed with Definity.  [name] 05/23/2020, 2:44 PM

## 2020-05-24 ENCOUNTER — Inpatient Hospital Stay (HOSPITAL_COMMUNITY): Payer: Medicare Other

## 2020-05-24 DIAGNOSIS — R4701 Aphasia: Secondary | ICD-10-CM

## 2020-05-24 DIAGNOSIS — I6300 Cerebral infarction due to thrombosis of unspecified precerebral artery: Secondary | ICD-10-CM

## 2020-05-24 DIAGNOSIS — I69320 Aphasia following cerebral infarction: Secondary | ICD-10-CM

## 2020-05-24 LAB — LIPID PANEL
Cholesterol: 124 mg/dL (ref 0–200)
HDL: 56 mg/dL (ref >40)
LDL Cholesterol: 51 mg/dL (ref 0–99)
Total CHOL/HDL Ratio: 2.2 RATIO
Triglycerides: 84 mg/dL (ref <150)
VLDL: 17 mg/dL (ref 0–40)

## 2020-05-24 LAB — HEMOGLOBIN A1C
Hgb A1c MFr Bld: 5.1 % (ref 4.8–5.6)
Mean Plasma Glucose: 99.67 mg/dL

## 2020-05-24 MED ORDER — DIVALPROEX SODIUM ER 250 MG PO TB24
250.0000 mg | ORAL_TABLET | Freq: Once | ORAL | Status: AC
  Administered 2020-05-24: 250 mg via ORAL
  Filled 2020-05-24: qty 1

## 2020-05-24 MED ORDER — CHLORHEXIDINE GLUCONATE CLOTH 2 % EX PADS
6.0000 | MEDICATED_PAD | Freq: Every day | CUTANEOUS | Status: DC
  Administered 2020-05-24: 6 via TOPICAL

## 2020-05-24 MED ORDER — MORPHINE SULFATE (PF) 2 MG/ML IV SOLN
2.0000 mg | Freq: Once | INTRAVENOUS | Status: AC
  Administered 2020-05-24: 2 mg via INTRAVENOUS
  Filled 2020-05-24: qty 1

## 2020-05-24 MED ORDER — PANTOPRAZOLE SODIUM 40 MG PO TBEC
40.0000 mg | DELAYED_RELEASE_TABLET | Freq: Every day | ORAL | Status: DC
  Administered 2020-05-24 – 2020-05-25 (×2): 40 mg via ORAL
  Filled 2020-05-24 (×2): qty 1

## 2020-05-24 MED ORDER — CHLORHEXIDINE GLUCONATE CLOTH 2 % EX PADS
6.0000 | MEDICATED_PAD | Freq: Every day | CUTANEOUS | Status: DC
  Administered 2020-05-25: 6 via TOPICAL

## 2020-05-24 MED ORDER — RESOURCE THICKENUP CLEAR PO POWD
ORAL | Status: DC | PRN
  Filled 2020-05-24: qty 125

## 2020-05-24 MED ORDER — ORAL CARE MOUTH RINSE
15.0000 mL | Freq: Two times a day (BID) | OROMUCOSAL | Status: DC
  Administered 2020-05-24 – 2020-05-25 (×3): 15 mL via OROMUCOSAL

## 2020-05-24 MED ORDER — HYDROCODONE-ACETAMINOPHEN 10-325 MG PO TABS
1.0000 | ORAL_TABLET | ORAL | Status: DC | PRN
  Administered 2020-05-24 – 2020-05-26 (×6): 1 via ORAL
  Filled 2020-05-24 (×6): qty 1

## 2020-05-24 MED ORDER — DIVALPROEX SODIUM ER 250 MG PO TB24
500.0000 mg | ORAL_TABLET | Freq: Two times a day (BID) | ORAL | Status: DC
  Administered 2020-05-24 – 2020-05-26 (×4): 500 mg via ORAL
  Filled 2020-05-24: qty 1
  Filled 2020-05-24 (×4): qty 2
  Filled 2020-05-24: qty 1

## 2020-05-24 NOTE — Plan of Care (Signed)
  Problem: Pain Managment: Goal: General experience of comfort will improve Outcome: Progressing    Patient complains of chronic back pain 8/10 for which he normally takes Norco for at home. Patient currently with orders for tylenol suppository pending SLP eval. Patient states "tylenol will not work". Given morphineX1 overnight with good relief Dr. Leonie Man aware, orders obtained.

## 2020-05-24 NOTE — Procedures (Addendum)
Patient Name: [name]  MRN: [id number]  Epilepsy Attending: Lora Havens  Referring Physician/Provider: Dr. Roland Rack Date: 05/23/2020 Duration: 23.19 mins  Patient history: 87 yo male who presented as a code stroke due to aphasia, right sided weakness, left gaze preference.  EEG to evaluate for seizure.  Level of alertness: Awake, asleep  AEDs during EEG study: VPA  Technical aspects: This EEG study was done with scalp electrodes positioned according to the 10-20 International system of electrode placement. Electrical activity was acquired at a sampling rate of 500Hz  and reviewed with a high frequency filter of 70Hz  and a low frequency filter of 1Hz . EEG data were recorded continuously and digitally stored.   Description: The posterior dominant rhythm consists of 7.5 Hz activity of moderate voltage (25-35 uV) seen predominantly in posterior head regions, symmetric and reactive to eye opening and eye closing. Sleep was characterized by sleep spindles (12 to 14 Hz), maximal frontocentral region.   EEG also showed continuous generalized 3 to 7 Hz theta-delta slowing.  Hyperventilation and photic stimulation were not performed.     ABNORMALITY - Continuous slow, generalized  IMPRESSION: This study is suggestive of mild to moderate diffuse encephalopathy, nonspecific etiology.  No seizures or epileptiform discharges were seen throughout the recording.   Ajee Heasley Barbra Sarks

## 2020-05-24 NOTE — Progress Notes (Signed)
Rehab Admissions Coordinator Note:  Patient was screened by Cleatrice Burke for appropriateness for an Inpatient Acute Rehab Consult per therapy recs. .  At this time, we are recommending Inpatient Rehab consult. I will place order per protocol.  Cleatrice Burke RN MSN 05/24/2020, 5:48 PM  I can be reached at [phone].

## 2020-05-24 NOTE — Evaluation (Signed)
Speech Language Pathology Evaluation Patient Details Name: [name] MRN: [id number] DOB: 08/03/1933 Today's Date: 05/24/2020 Time: 7564-3329 SLP Time Calculation (min) (ACUTE ONLY): 13 min  Problem List:  Patient Active Problem List   Diagnosis Date Noted  . 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 Acute blood loss anemia   . Atypical chest pain   . Chronic duodenal ulcer with hemorrhage   . Gastrointestinal hemorrhage 01/07/2020  . Decreased hemoglobin   . Permanent atrial fibrillation ([place]) 10/24/2019  . Hypercholesterolemia 10/24/2019  . Current chronic use of systemic steroids 10/24/2019  . Macrocytic anemia 10/24/2019  . Dehiscence of amputation stump ([place])   . Wound infection 05/09/2018  . AKI (acute kidney injury) ([place])   . S/P BKA (below knee amputation) unilateral, right ([place])   . Below-knee amputation with complication, initial encounter ([place])   . Tachypnea   . Post-operative pain   . Subacute osteomyelitis, right ankle and foot ([place])   . Paralysis of right lower extremity ([place])   . TIA (transient ischemic attack) 03/24/2018  . Encephalopathy 03/24/2018  . Cellulitis 03/15/2018  . Hypernatremia 03/15/2018  . Persistent atrial fibrillation ([place]) 11/10/2017  . Severe muscle deconditioning 11/10/2017  . Hemorrhage 10/22/2017  . Coronary artery disease involving native coronary artery of native heart without angina pectoris 10/22/2017  . Acute hypoxemic respiratory failure ([place]) 07/17/2017  . Aspiration syndrome, subsequent encounter 07/11/2017  . Aspiration pneumonia ([place]) 07/01/2017  . Acute encephalopathy 07/01/2017  . Thrombocytopenia ([place]) 07/01/2017  . Chronic diastolic (congestive) heart failure ([place]) 07/01/2017  . Anemia of chronic disease 07/01/2017  . Elevated troponin 07/01/2017  . Chronic atrial fibrillation 07/01/2017  . Goals of care, counseling/discussion   . Palliative care by specialist   . Acute metabolic encephalopathy 51/88/4166  . Stage 3a  chronic kidney disease   . CAP (community acquired pneumonia) 04/17/2017  . Hallux rigidus, right foot 03/10/2016  . Lumbosacral spondylosis without myelopathy 10/29/2015  . Memory difficulty 09/22/2015  . Abnormality of gait 09/22/2015  . Hyperglycemia 07/06/2015  . Chronic pain 07/06/2015  . Chronic insomnia 03/30/2015  . Transient alteration of awareness 03/30/2015  . Abnormal liver function   . Altered mental status 01/31/2015  . Essential hypertension 01/31/2015  . Constipation 01/31/2015  . Hypothyroidism 01/31/2015  . Seizure disorder ([place]) 01/31/2015  . Bladder outlet obstruction 01/31/2015  . GERD (gastroesophageal reflux disease) 01/31/2015  . Chronic back pain 01/31/2015  . Acute kidney injury ([place]) 01/31/2015   Past Medical History:  Past Medical History:  Diagnosis Date  . Abnormality of gait 09/22/2015  . Arthritis   . Atrial fibrillation ([place])   . CAD (coronary artery disease)    Stent to RCA, Penta stent, 99% reduced to 0% 2002.  . Cancer ([place])    skin CA removed from back  . 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 Chronic insomnia 03/30/2015  . Chronic kidney disease, stage 3 ([place])   . Complication of anesthesia    trouble waking up  . GERD (gastroesophageal reflux disease)   . Hypercholesteremia   . Hypertension   . Hypothyroidism   . Iron deficiency anemia   . Memory difficulty 09/22/2015  . Osteoarthritis   . PAD (peripheral artery disease) ([place])   . Pneumonia   . Seizures ([place])   . Sepsis ([place]) 05/2017  . Transient alteration of awareness 03/30/2015  . Vertigo    hx of  . Vitamin D deficiency    Past Surgical History:  Past  Surgical History:  Procedure Laterality Date  . AMPUTATION Right 03/28/2018   Procedure: AMPUTATION BELOW KNEE;  Surgeon: Newt Minion, MD;  Location: [facility];  Service: Orthopedics;  Laterality: Right;  . APPLICATION OF WOUND VAC Right 01/23/2019   Procedure: Application Of  Prevena Wound Vac;   Surgeon: Newt Minion, MD;  Location: [facility];  Service: Orthopedics;  Laterality: Right;  . BACK SURGERY    . BIOPSY  01/08/2020   Procedure: BIOPSY;  Surgeon: Gatha Mayer, MD;  Location: [facility];  Service: Endoscopy;;  . ESOPHAGOGASTRODUODENOSCOPY (EGD) WITH PROPOFOL N/A 01/08/2020   Procedure: ESOPHAGOGASTRODUODENOSCOPY (EGD) WITH PROPOFOL;  Surgeon: Gatha Mayer, MD;  Location: [facility];  Service: Endoscopy;  Laterality: N/A;  . EYE SURGERY     Bilateral Cataract surgery   . HERNIA REPAIR    . I & D EXTREMITY Right 05/10/2015   Procedure: IRRIGATION AND DEBRIDEMENT EXTREMITY;  Surgeon: Leanora Cover, MD;  Location: [facility];  Service: Orthopedics;  Laterality: Right;  . KNEE ARTHROPLASTY     right knee X 2; left knee once  . LAMINECTOMY     X 6  . LEG AMPUTATION BELOW KNEE Right 03/28/2018  . POSTERIOR CERVICAL FUSION/FORAMINOTOMY  01/28/2012   Procedure: POSTERIOR CERVICAL FUSION/FORAMINOTOMY LEVEL 3;  Surgeon: [name], MD;  Location: [facility];  Service: Neurosurgery;  Laterality: Left;  Posterior Cervical Five-Thoracic One Fusion, Arthrodesis with LEFT Cervical Seven-thoracic One Laminectomy, Foraminotomy and Resection of Synovial Cyst  . POSTERIOR CERVICAL FUSION/FORAMINOTOMY N/A 01/29/2013   Procedure: POSTERIOR CERVICAL FUSION/FORAMINOTOMY LEVEL 1 and C2-5 Posteriolateral Arthrodesis;  Surgeon: [name], MD;  Location: [facility];  Service: Neurosurgery;  Laterality: N/A;  C2-C3 Laminectomy C2-C3 posterior cervical arthrodesis  . STUMP REVISION Right 01/23/2019   Procedure: REVISION RIGHT BELOW KNEE AMPUTATION;  Surgeon: Newt Minion, MD;  Location: [facility];  Service: Orthopedics;  Laterality: Right;  . TONSILLECTOMY     HPI:  DARIS HARKINS is a 87 y.o. male with a PMHx of PAD with chronic wound s/p right BKA in 2020, CAD s/p stent 2002, CHF, apiration pna x 3, AF off AC, complex partial seizure disorder, hypothyroidism, HLD,  HTN, duodenal ulcer with GIB 2021, CKD III, and dementia. Admitted with acute onset of speech difficulty, oatmeal leaking from his mouth and right arm weakness. MRI Patchy abnormal diffusion at the left perirolandic cortex most resembling acute ischemia. Otherwise stable chronic small vessel disease since the 2020 MRI; Chronic cervicomedullary junction and C1 level spinal stenosis in the setting of severe spinal degeneration. Wife reported progressive cognitive decline past 1-2 years- med management is done "together" with full supervision from her. She handles finances.   Assessment / Plan / Recommendation Clinical Impression  Pt's wife present and reports cognitive decline for several years and she mangages pt's medications and finances. Appropriately, she has pt assists" her with these tasks to help maintain current cognitive function as long as able. 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 His speech is moderately dysarthric and is acute per pt and wife. He had difficulty answering basic problem solving questions (likely distracted by RN working on IV) and aware he had difficulty attending and answering. Seven items named in basic category. Expressive and receptive language for basic needs and conversation was within functional limits. Further therapy rec'd for dysarthria and cognition focusing on use of environmnetal controls and pt/family education.    SLP Assessment  SLP Recommendation/Assessment: Patient  needs continued Speech Lanaguage Pathology Services SLP Visit Diagnosis: Dysarthria and anarthria (R47.1);Cognitive communication deficit (R41.841)    Follow Up Recommendations  Inpatient Rehab    Frequency and Duration min 2x/week  2 weeks      SLP Evaluation Cognition  Overall Cognitive Status: History of cognitive impairments - at baseline Arousal/Alertness: Awake/alert Orientation Level: Oriented to person;Oriented to place Attention: Sustained Sustained Attention:  Appears intact Memory: Impaired Memory Impairment: Decreased recall of new information Awareness: Impaired Awareness Impairment: Anticipatory impairment Problem Solving: Impaired Problem Solving Impairment: Verbal basic Safety/Judgment: Impaired       Comprehension  Auditory Comprehension Overall Auditory Comprehension: Appears within functional limits for tasks assessed Visual Recognition/Discrimination Discrimination: Not tested    Expression Expression Primary Mode of Expression: Verbal Verbal Expression Overall Verbal Expression: Impaired Initiation: No impairment Level of Generative/Spontaneous Verbalization: Sentence Repetition:  (NT) Naming: Impairment Divergent:  (7 animals in one minute) Pragmatics: No impairment Written Expression Dominant Hand: Right Written Expression: Not tested   Oral / Motor  Oral Motor/Sensory Function Overall Oral Motor/Sensory Function: Mild impairment Facial ROM: Reduced right;Suspected CN VII (facial) dysfunction Facial Symmetry: Abnormal symmetry right;Suspected CN VII (facial) dysfunction Facial Strength: Reduced right;Suspected CN VII (facial) dysfunction Facial Sensation: Reduced right;Suspected CN V (Trigeminal) dysfunction Motor Speech Overall Motor Speech: Impaired Respiration: Impaired Level of Impairment: Conversation Phonation: Low vocal intensity Resonance: Within functional limits Articulation: Impaired Level of Impairment: Phrase Intelligibility: Intelligibility reduced Word: 75-100% accurate Phrase: 50-74% accurate Sentence: 50-74% accurate Motor Planning: Witnin functional limits   GO                    Houston Siren 05/24/2020, 11:48 AM  Orbie Pyo Colvin Caroli.Ed Risk analyst [phone] Office [phone]

## 2020-05-24 NOTE — Evaluation (Signed)
Clinical/Bedside Swallow Evaluation Patient Details  Name: [name] MRN: [id number] Date of Birth: October 25, 1933  Today's Date: 05/24/2020 Time: SLP Start Time (ACUTE ONLY): 9767 SLP Stop Time (ACUTE ONLY): 0950 SLP Time Calculation (min) (ACUTE ONLY): 13 min  Past Medical History:  Past Medical History:  Diagnosis Date  . Abnormality of gait 09/22/2015  . Arthritis   . Atrial fibrillation ([place])   . CAD (coronary artery disease)    Stent to RCA, Penta stent, 99% reduced to 0% 2002.  . Cancer ([place])    skin CA removed from back  . 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 Chronic insomnia 03/30/2015  . Chronic kidney disease, stage 3 ([place])   . Complication of anesthesia    trouble waking up  . GERD (gastroesophageal reflux disease)   . Hypercholesteremia   . Hypertension   . Hypothyroidism   . Iron deficiency anemia   . Memory difficulty 09/22/2015  . Osteoarthritis   . PAD (peripheral artery disease) ([place])   . Pneumonia   . Seizures ([place])   . Sepsis ([place]) 05/2017  . Transient alteration of awareness 03/30/2015  . Vertigo    hx of  . Vitamin D deficiency    Past Surgical History:  Past Surgical History:  Procedure Laterality Date  . AMPUTATION Right 03/28/2018   Procedure: AMPUTATION BELOW KNEE;  Surgeon: Newt Minion, MD;  Location: [facility];  Service: Orthopedics;  Laterality: Right;  . APPLICATION OF WOUND VAC Right 01/23/2019   Procedure: Application Of  Prevena Wound Vac;  Surgeon: Newt Minion, MD;  Location: [facility];  Service: Orthopedics;  Laterality: Right;  . BACK SURGERY    . BIOPSY  01/08/2020   Procedure: BIOPSY;  Surgeon: Gatha Mayer, MD;  Location: [facility];  Service: Endoscopy;;  . ESOPHAGOGASTRODUODENOSCOPY (EGD) WITH PROPOFOL N/A 01/08/2020   Procedure: ESOPHAGOGASTRODUODENOSCOPY (EGD) WITH PROPOFOL;  Surgeon: Gatha Mayer, MD;  Location: [facility];  Service: Endoscopy;  Laterality: N/A;  . EYE SURGERY     Bilateral  Cataract surgery   . HERNIA REPAIR    . I & D EXTREMITY Right 05/10/2015   Procedure: IRRIGATION AND DEBRIDEMENT EXTREMITY;  Surgeon: Leanora Cover, MD;  Location: [facility];  Service: Orthopedics;  Laterality: Right;  . KNEE ARTHROPLASTY     right knee X 2; left knee once  . LAMINECTOMY     X 6  . LEG AMPUTATION BELOW KNEE Right 03/28/2018  . POSTERIOR CERVICAL FUSION/FORAMINOTOMY  01/28/2012   Procedure: POSTERIOR CERVICAL FUSION/FORAMINOTOMY LEVEL 3;  Surgeon: [name], MD;  Location: [facility];  Service: Neurosurgery;  Laterality: Left;  Posterior Cervical Five-Thoracic One Fusion, Arthrodesis with LEFT Cervical Seven-thoracic One Laminectomy, Foraminotomy and Resection of Synovial Cyst  . POSTERIOR CERVICAL FUSION/FORAMINOTOMY N/A 01/29/2013   Procedure: POSTERIOR CERVICAL FUSION/FORAMINOTOMY LEVEL 1 and C2-5 Posteriolateral Arthrodesis;  Surgeon: [name], MD;  Location: [facility];  Service: Neurosurgery;  Laterality: N/A;  C2-C3 Laminectomy C2-C3 posterior cervical arthrodesis  . STUMP REVISION Right 01/23/2019   Procedure: REVISION RIGHT BELOW KNEE AMPUTATION;  Surgeon: Newt Minion, MD;  Location: [facility];  Service: Orthopedics;  Laterality: Right;  . TONSILLECTOMY     HPI:  NICHALAS [name] is a 87 y.o. male with a PMHx of PAD with chronic wound s/p right BKA in 2020, CAD s/p stent 2002, CHF, apiration pna x 3, AF off AC, complex partial seizure disorder, hypothyroidism, HLD, HTN,  duodenal ulcer with GIB 2021, CKD III, and dementia. Admitted with acute onset of speech difficulty, oatmeal leaking from his mouth and right arm weakness. MRI Patchy abnormal diffusion at the left perirolandic cortex most resembling acute ischemia. Otherwise stable chronic small vessel disease since the 2020 MRI; Chronic cervicomedullary junction and C1 level spinal stenosis in the setting of severe spinal degeneration. MBS 07/02/17 decr oral propulsion, barium pill lodge in  valleculae then proximal esophagus; increased risk d/t pharyngo-cervical esophageal and probable esophageal dysphagia >Dys 3/thin rec'd. BSE 07/12/17 recommending nectar thick full liquids and advanced to thin next day. CXR 3/14 No new airspace  disease.   Assessment / Plan / Recommendation Clinical Impression  Pt has a history of pharyngo-esophageal and suspected esophageal dysphagia fromMBS 2019. Wife reports eating a regular texture, thin liquid diet at home they deny recent difficulties. He has CV VII damage with mild facial droop and likely decreased sensation resulitng in reduced labial seal, leakage of thin liquids and decreased secretion management (drooling at rest). Pt appeared to have reduced control or  coordination with thin and held in oral cavity prior to transit. He was unable to efficiently and consistently use straw due to decreased oral pressure. Subtle indications aspiration with thin and when larger sips consumed he did cough immediately. Improved coordination, swifter transit and swallow initiation with trials nectar thick and pt stated "that was better" and no s/s aspiration. Mild oral residue after cracker. Recommend pt initiate Dys 2, nectar thick liquids, crush pills, swallow twice, check for right sided pocketing, full assist for feeding, sit upright and remain upright 30 min after meals. SLP Visit Diagnosis: Dysphagia, unspecified (R13.10)    Aspiration Risk  Mild aspiration risk    Diet Recommendation Dysphagia 2 (Fine chop);Nectar-thick liquid   Liquid Administration via: Cup Medication Administration: Crushed with puree Supervision: Patient able to self feed;Full supervision/cueing for compensatory strategies Compensations: Slow rate;Small sips/bites;Multiple dry swallows after each bite/sip;Lingual sweep for clearance of pocketing Postural Changes: Seated upright at 90 degrees;Remain upright for at least 30 minutes after po intake    Other  Recommendations Oral Care  Recommendations: Oral care BID Other Recommendations: Prohibited food (jello, ice cream, thin soups)   Follow up Recommendations Inpatient Rehab      Frequency and Duration min 2x/week  2 weeks       Prognosis Prognosis for Safe Diet Advancement:  (fair-good) Barriers to Reach Goals: Cognitive deficits      Swallow Study   General Date of Onset: 05/23/20 HPI: DAYMON HORA is a 87 y.o. male with a PMHx of PAD with chronic wound s/p right BKA in 2020, CAD s/p stent 2002, CHF, apiration pna x 3, AF off AC, complex partial seizure disorder, hypothyroidism, HLD, HTN, duodenal ulcer with GIB 2021, CKD III, and dementia. Admitted with acute onset of speech difficulty, oatmeal leaking from his mouth and right arm weakness. MRI Patchy abnormal diffusion at the left perirolandic cortex most resembling acute ischemia. Otherwise stable chronic small vessel disease since the 2020 MRI; Chronic cervicomedullary junction and C1 level spinal stenosis in the setting of severe spinal degeneration. MBS 07/02/17 decr oral propulsion, barium pill lodge in valleculae then proximal esophagus; increased risk d/t pharyngo-cervical esophageal and probable esophageal dysphagia >Dys 3/thin rec'd. BSE 07/12/17 recommending nectar thick full liquids and advanced to thin next day. CXR 3/14 No new airspace  disease. Type of Study: Bedside Swallow Evaluation Previous Swallow Assessment:  (see HPI) Diet Prior to this Study: NPO Temperature Spikes Noted:  No Respiratory Status: Room air History of Recent Intubation: No Behavior/Cognition: Alert;Cooperative;Pleasant mood Oral Cavity Assessment: Dry Oral Care Completed by SLP: Yes Oral Cavity - Dentition: Adequate natural dentition Vision: Functional for self-feeding Self-Feeding Abilities: Able to feed self;Needs assist Patient Positioning: Upright in bed Baseline Vocal Quality: Low vocal intensity Volitional Cough: Strong Volitional Swallow: Able to elicit     Oral/Motor/Sensory Function Overall Oral Motor/Sensory Function: Mild impairment Facial ROM: Reduced right;Suspected CN VII (facial) dysfunction Facial Symmetry: Abnormal symmetry right;Suspected CN VII (facial) dysfunction Facial Strength: Reduced right;Suspected CN VII (facial) dysfunction   Ice Chips Ice chips: Not tested   Thin Liquid Thin Liquid: Impaired Presentation: Cup;Straw Oral Phase Impairments: Reduced lingual movement/coordination Oral Phase Functional Implications: Oral holding Pharyngeal  Phase Impairments: Throat Clearing - Immediate;Cough - Immediate    Nectar Thick Nectar Thick Liquid: Within functional limits Presentation: Cup Pharyngeal Phase Impairments:  (none)   Honey Thick Honey Thick Liquid: Not tested   Puree Puree: Impaired Oral Phase Functional Implications: Oral holding Pharyngeal Phase Impairments: Suspected delayed Swallow   Solid     Solid: Impaired Oral Phase Impairments: Reduced lingual movement/coordination Oral Phase Functional Implications: Oral residue      Houston Siren 05/24/2020,11:28 AM  Orbie Pyo Colvin Caroli.Ed Risk analyst [phone] Office [phone]

## 2020-05-24 NOTE — Evaluation (Signed)
Physical Therapy Evaluation Patient Details Name: [name] MRN: [id number] DOB: Aug 03, 1933 Today's Date: 05/24/2020   History of Present Illness  87 yo male admitted 3/14 after going unresponsive with breakfast and concerns for aspiration, R arm weakness, L gaze preference and non verbal. TPa given 3/14. MRI demonstrates patchy abnormal diffusion at the left perirolandic cortex most resembling acute ischemia. 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  Clinical Impression   Pt presents with generalized weakness, poor skin integrity, poor sitting balance, and difficulty performing mobility tasks. Pt to benefit from acute PT to address deficits. Pt requiring mod +2 assist for bed mobility tasks this day, tolerated EOB sitting x10 minutes with intermittent posterior support given fatigue. PT recommending CIR, as pt was transferring to/from w/c with transfer board PTA and pt would benefit from CIR to maximize mobility and decrease caregiver burden. PT to progress mobility as tolerated, and will continue to follow acutely.      Follow Up Recommendations CIR (vs home with HHPT and hoyer lift)    Equipment Recommendations  Other (comment) (if home, hoyer lift)    Recommendations for Other Services       Precautions / Restrictions Precautions Precautions: Fall Precaution Comments: very thin skin, skin tears easily with multiple sites of skin breakdown on lateral aspect of R residual limb, skin tear under BP cuff Required Braces or Orthoses: Other Brace (R prosthetic not used due to wounds on R LE at this time) Restrictions Weight Bearing Restrictions: No      Mobility  Bed Mobility Overal bed mobility: Needs Assistance Bed Mobility: Rolling;Sidelying to Sit;Sit to  Supine Rolling: Mod assist;+2 for physical assistance Sidelying to sit: Mod assist;+2 for physical assistance   Sit to supine: Mod assist;+2 for physical assistance   General bed mobility comments: mod +2 for rolling bilaterally for truncal and LE translation, trunk elevation/lowering, and LE translation to/from EOB. PT and OT providing assist for boost up in bed.    Transfers                 General transfer comment: defer to next session  Ambulation/Gait             General Gait Details: unable  Stairs            Wheelchair Mobility    Modified Rankin (Stroke Patients Only) Modified Rankin (Stroke Patients Only) Pre-Morbid Rankin Score: Moderately severe disability Modified Rankin: Severe disability     Balance Overall balance assessment: Needs assistance Sitting-balance support: Bilateral upper extremity supported;Feet supported Sitting balance-Leahy Scale: Poor Sitting balance - Comments: fair to poor, requiring min PT assist for posterior support with fatigue Postural control: Posterior lean     Standing balance comment: unable                             Pertinent Vitals/Pain Pain Assessment: Faces Faces Pain Scale: Hurts a little bit Pain Location: generalized, during mobility Pain Descriptors / Indicators: Grimacing;Discomfort Pain Intervention(s): Monitored during session;Premedicated before session;Repositioned    Home Living Family/patient expects to be discharged to:: Private residence Living Arrangements: Spouse/significant other Available Help at Discharge: Family;Available 24 hours/day Type of Home: House Home Access: Ramped entrance     Home Layout: One  level Home Equipment: Walker - 4 wheels;Cane - single point;Bedside commode;Shower seat;Wheelchair - manual;Grab bars - tub/shower;Transport chair;Hospital bed;Other (comment) (prosthetic) Additional Comments: transfer board    Prior Function Level of Independence:  Needs assistance   Gait / Transfers Assistance Needed: pt uses transfer board for transfer in and out of w/c, does not don prosthesis anymore  ADL's / Homemaking Assistance Needed: Pt's wife dresses, bathes, performs pericare for pt  Comments: wife reports incontinence of stool is not abnormal for patient     Hand Dominance   Dominant Hand: Right    Extremity/Trunk Assessment   Upper Extremity Assessment Upper Extremity Assessment: Generalized weakness;LUE deficits/detail (wife reports arms are close to baseline and L UE has hx of "frozen shoulder") LUE Deficits / Details: hx of frozen shoulder arom elevation to 50 degrees    Lower Extremity Assessment Lower Extremity Assessment: Defer to PT evaluation;RLE deficits/detail RLE Deficits / Details: noted to have wound lateral aspect behind knee cap and just medially that are PTA per wife and only two wounds left to heal. Pt noted to have redness and warm space right at the patella. wife states "i am so worried about that. it does not look good"    Cervical / Trunk Assessment Cervical / Trunk Assessment: Kyphotic (wife reports hx of back pain)  Communication   Communication: Other (comment)  Cognition Arousal/Alertness: Awake/alert Behavior During Therapy: Flat affect Overall Cognitive Status: History of cognitive impairments - at baseline Area of Impairment: Following commands;Safety/judgement;Problem solving                       Following Commands: Follows one step commands with increased time Safety/Judgement: Decreased awareness of safety   Problem Solving: Requires verbal cues;Requires tactile cues;Slow processing;Decreased initiation;Difficulty sequencing General Comments: pt drowsy during session, but participatory. Pt requires step-by-step cuing for mobility, especially moving to/from EOB.      General Comments General comments (skin integrity, edema, etc.): PT/OT applied sacral pad, re-dressed residual limb,  and informed RN of bleeding from R antecubital fossa IV site. Pt with hemosideran stained appearance of extremities UEs>LEs    Exercises     Assessment/Plan    PT Assessment Patient needs continued PT services  PT Problem List Decreased strength;Decreased mobility;Decreased safety awareness;Decreased activity tolerance;Decreased balance;Decreased knowledge of use of DME;Pain;Cardiopulmonary status limiting activity;Decreased coordination;Decreased skin integrity;Decreased cognition       PT Treatment Interventions DME instruction;Therapeutic activities;Gait training;Therapeutic exercise;Patient/family education;Balance training;Stair training;Functional mobility training;Neuromuscular re-education    PT Goals (Current goals can be found in the Care Plan section)  Acute Rehab PT Goals Patient Stated Goal: to sit up PT Goal Formulation: With patient Time For Goal Achievement: 06/07/20 Potential to Achieve Goals: Good    Frequency Min 3X/week   Barriers to discharge        Co-evaluation PT/OT/SLP Co-Evaluation/Treatment: Yes Reason for Co-Treatment: Complexity of the patient's impairments (multi-system involvement);For patient/therapist safety;To address functional/ADL transfers PT goals addressed during session: Mobility/safety with mobility;Balance OT goals addressed during session: ADL's and self-care;Proper use of Adaptive equipment and DME;Strengthening/ROM       AM-PAC PT "6 Clicks" Mobility  Outcome Measure Help needed turning from your back to your side while in a flat bed without using bedrails?: A Lot Help needed moving from lying on your back to sitting on the side of a flat bed without using bedrails?: A Lot Help needed moving to and from a bed to a chair (including a wheelchair)?: A Lot Help needed standing  up from a chair using your arms (e.g., wheelchair or bedside chair)?: Total Help needed to walk in hospital room?: Total Help needed climbing 3-5 steps with a  railing? : Total 6 Click Score: 9    End of Session   Activity Tolerance: Patient limited by fatigue;Patient tolerated treatment well Patient left: in bed;with call bell/phone within reach;with bed alarm set;with family/visitor present Nurse Communication: Mobility status;Other (comment) (skin tear for R biceps from BP cuff, IV R antecubital fossa IV bleeding) PT Visit Diagnosis: Other abnormalities of gait and mobility (R26.89);Muscle weakness (generalized) (M62.81);Other symptoms and signs involving the nervous system (R29.898)    Time: 8102-5486 PT Time Calculation (min) (ACUTE ONLY): 30 min   Charges:   PT Evaluation $PT Eval Low Complexity: 1 Low        Velena Keegan S, PT Acute Rehabilitation Services Pager [phone]  Office [phone]  Louis Matte 05/24/2020, 3:35 PM

## 2020-05-24 NOTE — Progress Notes (Signed)
STROKE TEAM PROGRESS NOTE   INTERVAL HISTORY I have personally reviewed history of present illness with the patient and his wife, electronic medical records and imaging films in PACS.  He has history of seizures but wife feels the present episode was different.  He had drooling from his mouth and trouble speaking.  He had an MRI scan of the brain done which showed diffusion subtle changes in the left perirolandic He was given IV TPA.  He has shown improvement in his age but facial droop and dysarthria persists.  Blood pressure adequately controlled.  He was supposed to be on Eliquis but had stopped it 2 years ago. Vitals:   05/24/20 1149 05/24/20 1200 05/24/20 1300 05/24/20 1330  BP:  (!) 147/97 (!) 164/91 (!) 178/78  Pulse:  (!) 110    Resp:  12  13  Temp: 99 F (37.2 C)     TempSrc: Oral     SpO2:  98%    Weight:      Height:       CBC:  Recent Labs  Lab 05/23/20 0847 05/23/20 0852  WBC 7.1  --   NEUTROABS 5.1  --   HGB 10.5* 10.2*  HCT 33.5* 30.0*  MCV 104.0*  --   PLT 123*  --    Basic Metabolic Panel:  Recent Labs  Lab 05/23/20 0847 05/23/20 0852  NA 140 140  K 4.4 4.3  CL 106 104  CO2 23  --   GLUCOSE 153* 152*  BUN 36* 38*  CREATININE 1.39* 1.40*  CALCIUM 8.3*  --    Lipid Panel:  Recent Labs  Lab 05/24/20 0715  CHOL 124  TRIG 84  HDL 56  CHOLHDL 2.2  VLDL 17  LDLCALC 51   HgbA1c:  Recent Labs  Lab 05/24/20 0715  HGBA1C 5.1     IMAGING past 24 hours CT HEAD WO CONTRAST  Result Date: 05/24/2020 IMPRESSION: Findings concerning for left perirolandic acute ischemia were better characterized on prior MRI. No substantial edema or mass effect in this region. No mass occupying hemorrhagic transformation.  DG Chest Port 1 View Result Date: 05/23/2020 IMPRESSION: 1. Low lung volumes with left basilar scarring. No new airspace disease. 2. Cardiomegaly with aortic atherosclerosis. 3. Possible trace pleural effusions.  EEG adult Result Date:  05/24/2020  IMPRESSION: This study is suggestive of mild to moderate diffuse encephalopathy, nonspecific etiology.  No seizures or epileptiform discharges were seen throughout the recording.   2d echo: 1. Left ventricular ejection fraction, by estimation, is 55 to 60%. The  left ventricle has normal function. The left ventricle has no regional  wall motion abnormalities. There is severe concentric left ventricular  hypertrophy. Left ventricular diastolic  function could not be evaluated.  2. Right ventricular systolic function is normal. The right ventricular  size is normal.  3. Left atrial size was moderately dilated.  4. Right atrial size was mildly dilated.  5. The mitral valve is grossly normal. Mild mitral valve regurgitation.  6. The aortic valve is grossly normal. Aortic valve regurgitation is  trivial.    PHYSICAL EXAM Physical Exam  Constitutional: Appears well-developed and well-nourished elderly Caucasian male not in distress.  Psych: Affect appropriate to situation Eyes: No scleral injection HENT: No OP obstrucion Head: Normocephalic.  Cardiovascular: Normal rate and regular rhythm.  Respiratory: Effort normal and breath sounds normal to anterior ascultation GI: Soft.  No distension. There is no tenderness.  Skin: WDI. Purplish discoloration to BUEs.   Neuro:  Mental Status: Patient is awake, alert. Moderate dysarthria Later, is able to say his name and answer a few questions with one to three word answers. Speech is not fluent.  Mild word finding difficulty hesitancy.  Comprehension is intact.  Cranial Nerves: II: Visual Fields are full. Pupils are equal, round, and reactive to light. No blink to threat originally on right.  III,IV, VI: EOMI without ptosis or diploplia.  V: Facial sensation is symmetric to touch VII: Facial droop on the right  VIII: hearing is intact to voice X: Uvula elevates symmetrically XI: Shoulder shrug is symmetric. XII: tongue is  midline without atrophy or fasciculations.  Motor: Tone is normal. Bulk is normal. Strength right grip 4-/5, right biceps 3+/5, right triceps 4-/5. Right bellow the knee amputation. able to move stump, LLE/LUE 5/5.  Sensory: Sensation is symmetric to light touch and temperature in the arms and legs.  Cerebellar: FNF intact.  ASSESSMENT/PLAN [name] is a 87 y.o. male with history of PAD with chronic wound s/p right BKA in 2020, CAD s/p stent 2002, CHF, AF off AC self d/c'd his Eliquis in 2020 due to a leg hematoma., complex partial seizure disorder, hypothyroidism, HLD, HTN, duodenal ulcer with GIB 2021, CKD III, and dementia presented with aphasia, right sided weakness, and left gaze preference.  CT head, CTA head and neck and MRI brain performed. No LVO. Decision was made for tPA due to suspected stroke.     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 ASPECTS 10.  CTA head & neck Negative for emergent large vessel occlusion.  chronic arterial tortuosity and calcified plaque in the head and       neck. 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 - Moderate Left Vertebral V4 segment.  MRI Patchy abnormal diffusion at the left perirolandic cortex most resembling acute ischemia.   Carotid Doppler not ordered see CT angio  2D Echo 55-60%, severe LVH  LDL 51  HgbA1c 5.1  VTE prophylaxis - SCDs only until 24 hr post tPA scan is complete  Dyphagia 2 diet  aspirin 81 mg daily prior to admission, now on No antithrombotic because post tPA, will change to aspirin 325 mg after CT scan tomorrow.  Therapy recommendations: pending  Disposition:  pending  Seizures  Has seizure history, previously on vimpat but unable to tolerate due to  side effects.  Takes Depakote 250mg  bid  Seizure like episode yesterday morning, patient knocked his milk over then tilted his head and just started staring  EEG: mild to moderate diffuse encephalopathy, nonspecific etiology.  No seizures or epileptiform discharges were seen throughout the recording.  Depakote level 41  Will increase Depakote to 500mg  bid  Hypertension  Home meds:  Diltiazem 240mg  daily, Cardura 4mg  nightly, Meoprolol 25mg  bid  Stable  BP goal <180/105 then allow for permissive hypertension (OK if < 220/120) but gradually normalize in 5-7 days . Long-term BP goal normotensive  Hyperlipidemia  Home meds: pravastatin 40 daily  LDL 51, goal < 70  Atorvastatin 80mg  daily  Continue statin at discharge  CKD stage 3  Creatinine level 1.5   Monitor uop  Daily BMP  Other Stroke Risk Factors  Advanced Age >/= 110   Coronary artery disease (stent to RCA, 2002)  atrial fibrillation not on anticoagulation due to  leg hematoma history), PAD  Congestive heart failure  Peripheral Artery Disease  Other Active Problems  Hypothyroidism: Synthroid 179mcg daily  Hospital day # 1 Patient presented with expressive aphasia facial droop and slurred speech with left MCA branch infarct however MRI scan shows very subtle changes in the perirolandic cortex raising question of seizure.  Will do CT scan of the head and if infarct is confirmed resume anticoagulation.  No infarct is noted may need to repeat MRI tomorrow.  Continue close neurological monitoring and strict blood pressure control as per post TPA protocol.  Increase dose of Depakote due to level being suboptimal .  Long discussion with patient and wife and answered questions. This patient is critically ill and at significant risk of neurological worsening, death and care requires constant monitoring of vital signs, hemodynamics,respiratory and cardiac monitoring, extensive review of multiple databases, frequent  neurological assessment, discussion with family, other specialists and medical decision making of high complexity.I have made any additions or clarifications directly to the above note.This critical care time does not reflect procedure time, or teaching time or supervisory time of PA/NP/Med Resident etc but could involve care discussion time.  I spent 30 minutes of neurocritical care time  in the care of  this patient.  Antony Contras, MD   To contact Stroke Continuity provider, please refer to [email or web address] After hours, contact General Neurology

## 2020-05-24 NOTE — Progress Notes (Signed)
OT Cancellation Note  Patient Details Name: [name] MRN: [id number] DOB: [birth year]   Cancelled Treatment:    Reason Eval/Treat Not Completed: Active bedrest order OT order received and appreciated however this conflicts with current bedrest order set. Please increase activity tolerance as appropriate and remove bedrest from orders. . Please contact OT at [phone] if bed rest order is discontinued. OT will hold evaluation at this time and will check back as time allows pending increased activity orders.   Billey Chang, OTR/L  Acute Rehabilitation Services Pager: [phone] Office: [phone] .  05/24/2020, 8:11 AM

## 2020-05-24 NOTE — Evaluation (Signed)
Occupational Therapy Evaluation Patient Details Name: [name] MRN: [id number] DOB: [birth year] Today's Date: 05/24/2020    History of Present Illness 87 yo male admitted 3/14 after going unresponsive with breakfast and concerns for aspiration, R arm weakness, L gaze preference and non verbal. TPa given 3/14. MRI demonstrates patchy abnormal diffusion at the left perirolandic cortex most resembling acute ischemia. 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   Clinical Impression   PT admitted with stroke like symptoms with decreased responsiveness. Pt currently with functional limitiations due to the deficits listed below (see OT problem list). Pt currently requires total +2 mod (A) for basic bed mobility. Recommendation for CIR to return home with wife 24/7 (A) w/c chair level using sliding board.  Pt will benefit from skilled OT to increase their independence and safety with adls and balance to allow discharge CIR.     Follow Up Recommendations  CIR    Equipment Recommendations  None recommended by OT    Recommendations for Other Services       Precautions / Restrictions Precautions Precautions: Fall Precaution Comments: very thin skin, skin tears easily with multiple sites of skin breakdown on lateral aspect of R residual limb, skin tear under BP cuff Required Braces or Orthoses: Other Brace (R prosthetic not used due to wounds on R LE at this time) Restrictions Weight Bearing Restrictions: No      Mobility Bed Mobility Overal bed mobility: Needs Assistance Bed Mobility: Rolling;Sidelying to Sit;Sit to Supine Rolling: Mod assist;+2 for physical assistance Sidelying to sit: Mod assist;+2 for physical assistance   Sit to supine: Mod assist;+2 for physical  assistance   General bed mobility comments: mod +2 for rolling bilaterally for truncal and LE translation, trunk elevation/lowering, and LE translation to/from EOB. PT and OT providing assist for boost up in bed.    Transfers                 General transfer comment: defer to next session    Balance Overall balance assessment: Needs assistance Sitting-balance support: Bilateral upper extremity supported;Feet supported Sitting balance-Leahy Scale: Poor Sitting balance - Comments: fair to poor, requiring min PT assist for posterior support with fatigue Postural control: Posterior lean     Standing balance comment: unable                           ADL either performed or assessed with clinical judgement   ADL Overall ADL's : Needs assistance/impaired Eating/Feeding: Maximal assistance;Bed level Eating/Feeding Details (indicate cue type and reason): positioned for lunch tray with pillows in chair position                                   General ADL Comments: total (A) for all adls at this time. pt would benefit from hoyer lift only due to risk for falls and skin break down     Vision Baseline Vision/History: Wears glasses Wears Glasses: At all times       Perception     Praxis      Pertinent Vitals/Pain Pain Assessment: Faces Faces Pain Scale: Hurts a little bit Pain Location: generalized, during mobility Pain Descriptors / Indicators:  Grimacing;Discomfort Pain Intervention(s): Monitored during session;Premedicated before session;Repositioned     Hand Dominance Right   Extremity/Trunk Assessment Upper Extremity Assessment Upper Extremity Assessment: Generalized weakness;LUE deficits/detail (wife reports arms are close to baseline and L UE has hx of "frozen shoulder") LUE Deficits / Details: hx of frozen shoulder arom elevation to 50 degrees   Lower Extremity Assessment Lower Extremity Assessment: Defer to PT evaluation;RLE  deficits/detail RLE Deficits / Details: noted to have wound lateral aspect behind knee cap and just medially that are PTA per wife and only two wounds left to heal. Pt noted to have redness and warm space right at the patella. wife states "i am so worried about that. it does not look good"   Cervical / Trunk Assessment Cervical / Trunk Assessment: Kyphotic (wife reports hx of back pain)   Communication Communication Communication: Other (comment)   Cognition Arousal/Alertness: Awake/alert Behavior During Therapy: Flat affect Overall Cognitive Status: History of cognitive impairments - at baseline Area of Impairment: Following commands;Safety/judgement;Problem solving                       Following Commands: Follows one step commands with increased time Safety/Judgement: Decreased awareness of safety   Problem Solving: Requires verbal cues;Requires tactile cues;Slow processing;Decreased initiation;Difficulty sequencing General Comments: pt drowsy during session, but participatory. Pt requires step-by-step cuing for mobility, especially moving to/from EOB.   General Comments  PT/OT applied sacral pad, re-dressed residual limb, and informed RN of bleeding from R antecubital fossa IV site. Pt with hemosideran stained appearance of extremities UEs>LEs    Exercises     Shoulder Instructions      Home Living Family/patient expects to be discharged to:: Private residence Living Arrangements: Spouse/significant other Available Help at Discharge: Family;Available 24 hours/day Type of Home: House Home Access: Ramped entrance     Home Layout: One level     Bathroom Shower/Tub: Teacher, early years/pre: Handicapped height Bathroom Accessibility: Yes   Home Equipment: Environmental consultant - 4 wheels;Cane - single point;Bedside commode;Shower seat;Wheelchair - manual;Grab bars - tub/shower;Transport chair;Hospital bed;Other (comment) (prosthetic)   Additional Comments: transfer  board  Lives With: Spouse    Prior Functioning/Environment Level of Independence: Needs assistance  Gait / Transfers Assistance Needed: pt uses transfer board for transfer in and out of w/c, does not don prosthesis anymore ADL's / Homemaking Assistance Needed: Pt's wife dresses, bathes, performs pericare for pt   Comments: wife reports incontinence of stool is not abnormal for patient        OT Problem List: Decreased strength;Decreased activity tolerance;Impaired balance (sitting and/or standing);Decreased knowledge of use of DME or AE;Cardiopulmonary status limiting activity;Increased edema      OT Treatment/Interventions: Self-care/ADL training;Therapeutic exercise;Neuromuscular education;Energy conservation;DME and/or AE instruction;Manual therapy;Modalities;Therapeutic activities;Patient/family education;Balance training    OT Goals(Current goals can be found in the care plan section) Acute Rehab OT Goals Patient Stated Goal: to sit up OT Goal Formulation: With patient/family Time For Goal Achievement: 06/07/20 Potential to Achieve Goals: Good  OT Frequency: Min 2X/week   Barriers to D/C:            Co-evaluation PT/OT/SLP Co-Evaluation/Treatment: Yes Reason for Co-Treatment: Complexity of the patient's impairments (multi-system involvement);For patient/therapist safety;To address functional/ADL transfers PT goals addressed during session: Mobility/safety with mobility;Balance OT goals addressed during session: ADL's and self-care;Proper use of Adaptive equipment and DME;Strengthening/ROM      AM-PAC OT "6 Clicks" Daily Activity     Outcome Measure Help from another person  eating meals?: A Lot Help from another person taking care of personal grooming?: A Lot Help from another person toileting, which includes using toliet, bedpan, or urinal?: Total Help from another person bathing (including washing, rinsing, drying)?: Total Help from another person to put on and taking  off regular upper body clothing?: A Lot Help from another person to put on and taking off regular lower body clothing?: Total 6 Click Score: 9   End of Session Nurse Communication: Mobility status;Precautions  Activity Tolerance: Patient tolerated treatment well Patient left: in bed;with call bell/phone within reach;with bed alarm set;with family/visitor present  OT Visit Diagnosis: Unsteadiness on feet (R26.81);Muscle weakness (generalized) (M62.81)                Time: 1833-5825 OT Time Calculation (min): 29 min Charges:  OT General Charges $OT Visit: 1 Visit OT Evaluation $OT Eval High Complexity: 1 High   Brynn, OTR/L  Acute Rehabilitation Services Pager: [phone] Office: [phone] .   Jeri Modena 05/24/2020, 2:24 PM

## 2020-05-25 ENCOUNTER — Inpatient Hospital Stay (HOSPITAL_COMMUNITY): Payer: Medicare Other

## 2020-05-25 DIAGNOSIS — I63412 Cerebral infarction due to embolism of left middle cerebral artery: Secondary | ICD-10-CM

## 2020-05-25 MED ORDER — APIXABAN 5 MG PO TABS: 5.0000 mg | ORAL_TABLET | Freq: Two times a day (BID) | ORAL | Status: DC

## 2020-05-25 MED ORDER — FUROSEMIDE 20 MG PO TABS
20.0000 mg | ORAL_TABLET | Freq: Every day | ORAL | Status: DC
  Administered 2020-05-26: 20 mg via ORAL
  Filled 2020-05-25: qty 1

## 2020-05-25 MED ORDER — DABIGATRAN ETEXILATE MESYLATE 150 MG PO CAPS
150.0000 mg | ORAL_CAPSULE | Freq: Two times a day (BID) | ORAL | Status: DC
Start: 2020-05-25 — End: 2020-05-26
  Administered 2020-05-25 – 2020-05-26 (×2): 150 mg via ORAL
  Filled 2020-05-25 (×3): qty 1

## 2020-05-25 MED ORDER — DOXAZOSIN MESYLATE 4 MG PO TABS
4.0000 mg | ORAL_TABLET | Freq: Every day | ORAL | Status: DC
  Administered 2020-05-25: 4 mg via ORAL
  Filled 2020-05-25 (×2): qty 1

## 2020-05-25 MED ORDER — METOPROLOL TARTRATE 25 MG PO TABS
25.0000 mg | ORAL_TABLET | Freq: Two times a day (BID) | ORAL | Status: DC
  Administered 2020-05-25 – 2020-05-26 (×3): 25 mg via ORAL
  Filled 2020-05-25 (×3): qty 1

## 2020-05-25 MED ORDER — DILTIAZEM HCL ER COATED BEADS 240 MG PO CP24
240.0000 mg | ORAL_CAPSULE | Freq: Every day | ORAL | Status: DC
  Administered 2020-05-25 – 2020-05-26 (×2): 240 mg via ORAL
  Filled 2020-05-25 (×2): qty 1

## 2020-05-25 NOTE — Consult Note (Signed)
WOC Nurse Consult Note: Patient receiving care in [facility] 4N16. Spouse, daughter, and primary RN present at time of my assessment. Reason for Consult: right BKA stump care Wound type: NO wounds present Pressure Injury POA: Yes/No/NA Measurement: Wound bed: Drainage (amount, consistency, odor)  Periwound: Dressing procedure/placement/frequency: The following care instructions were entered:  For the right stump, follow these instructions explicitly: ONLY the wife will wash the right leg from the knee down, and she will use the lotion she has to moisturize the area. Elevate the leg on a pillow to prevent the stump from resting on the mattress. Do NOT place a foam dressing on the stump. The patient has decided he does NOT want to wear the stump sock shrinker provided by Dr. [name]. AND, MONITOR THE PINK SPOT ON THE KNEE AREA FOR of these develop.  Thank you for the consult.  Discussed plan of care with the patient and bedside nurse.  [facility] nurse will not follow at this time.  Please re-consult the [place] team if needed.  Val Riles, RN, MSN, CWOCN, CNS-BC, pager [phone]

## 2020-05-25 NOTE — Progress Notes (Signed)
ANTICOAGULATION CONSULT NOTE - Initial Consult  Pharmacy Consult: dabigatran Indication: atrial fibrillation  Allergies  Allergen Reactions  . Eliquis [Apixaban] Other (See Comments)    bleeding  . Demerol [Meperidine] Nausea And Vomiting  . Keppra [Levetiracetam] Other (See Comments)    Causes sleepiness, mental status changes  . Vimpat [Lacosamide] Other (See Comments)    Over sedated     Patient Measurements: Height: 5' 9.02" (175.3 cm) Weight: 70.1 kg (154 lb 8.7 oz) IBW/kg (Calculated) : 70.74  Vital Signs: Temp: 98.3 F (36.8 C) (03/16 1200) Temp Source: Oral (03/16 1200) BP: 120/85 (03/16 1600) Pulse Rate: 89 (03/16 1600)  Labs: Recent Labs    05/23/20 0847 05/23/20 0852  HGB 10.5* 10.2*  HCT 33.5* 30.0*  PLT 123*  --   APTT 29  --   LABPROT 14.4  --   INR 1.2  --   CREATININE 1.39* 1.40*    Estimated Creatinine Clearance: 38.9 mL/min (A) (by C-G formula based on SCr of 1.4 mg/dL (H)).   Medical History: Past Medical History:  Diagnosis Date  . Abnormality of gait 09/22/2015  . Arthritis   . Atrial fibrillation ([place])   . CAD (coronary artery disease)    Stent to RCA, Penta stent, 99% reduced to 0% 2002.  . Cancer ([place])    skin CA removed from back  . 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 Chronic insomnia 03/30/2015  . Chronic kidney disease, stage 3 ([place])   . Complication of anesthesia    trouble waking up  . GERD (gastroesophageal reflux disease)   . Hypercholesteremia   . Hypertension   . Hypothyroidism   . Iron deficiency anemia   . Memory difficulty 09/22/2015  . Osteoarthritis   . PAD (peripheral artery disease) ([place])   . Pneumonia   . Seizures ([place])   . Sepsis ([place]) 05/2017  . Transient alteration of awareness 03/30/2015  . Vertigo    hx of  . Vitamin D deficiency      Assessment: 21 YOM presented as a code stroke and received tPA on 05/23/20 AM. He has a history of Afib and self-discontinued Eliquis PTA  due to leg hematoma. He also had a GIB in 2021. Pharmacy consulted to resume Eliquis. Patient qualifies for full dosing since weight > 60kg and SCr < 1.5. No bleeding reported.  ADDENDUM - Patient had long discussion with Dr. Leonie Man and apparently does not want to go back to Eliquis, as he had a hematoma on this medication. Pharmacy now consulted to transition to dabigatran instead (no doses of apixaban received). Hg 10.2, plt 123 (last 3/14). SCr 1.4, CrCl ~35-40.  Goal of Therapy:  Stroke prevention Monitor platelets by anticoagulation protocol: Yes   Plan:  D/c apixaban orders Start dabigatran 150mg  PO q12h Monitor CBC, s/sx bleeding Watch SCr closely   Arturo Morton, PharmD, BCPS Please check AMION for all [place] contact numbers Clinical Pharmacist 05/25/2020 4:53 PM

## 2020-05-25 NOTE — TOC Initial Note (Signed)
Transition of Care [facility]) - Initial/Assessment Note    Patient Details  Name: [name] MRN: [id number] Date of Birth: 16-Feb-1934  Transition of Care [facility]) CM/SW Contact:    Ella Bodo, RN Phone Number: 05/25/2020, 2:49 PM  Clinical Narrative:   87 yo male admitted 3/14 after going unresponsive with breakfast and concerns for aspiration, R arm weakness, L gaze preference and non verbal. TPa given 3/14. MRI demonstrates patchy abnormal diffusion at the left perirolandic cortex most resembling acute ischemia.  PTA, pt needs assistance with ADLS; uses transfer board to get in/out of WC.  PT/OT recommending CIR and consult in process. Wife able to provide 24h care at dc. Await medical stability and completion of medical workup prior to dc to CIR.               Expected Discharge Plan: IP Rehab Facility Barriers to Discharge: Continued Medical Work up   Patient Goals and CMS Choice Patient states their goals for this hospitalization and ongoing recovery are:: to sit up/get better CMS Medicare.gov Compare Post Acute Care list provided to:: Patient    Expected Discharge Plan and Services Expected Discharge Plan: [place]   Discharge Planning Services: CM Consult   Living arrangements for the past 2 months: Single Family Home                                      Prior Living Arrangements/Services Living arrangements for the past 2 months: Single Family Home   Patient language and need for interpreter reviewed:: Yes Do you feel safe going back to the place where you live?: Yes      Need for Family Participation in Patient Care: Yes (Comment) Care giver support system in place?: Yes (comment)   Criminal Activity/Legal Involvement Pertinent to Current Situation/Hospitalization: No - Comment as needed  Activities of Daily Living Home Assistive Devices/Equipment: Wheelchair,Transfer board ADL Screening (condition at time of admission) Patient's cognitive  ability adequate to safely complete daily activities?: No Is the patient deaf or have difficulty hearing?: No Does the patient have difficulty seeing, even when wearing glasses/contacts?: No Does the patient have difficulty concentrating, remembering, or making decisions?: No Patient able to express need for assistance with ADLs?: Yes Does the patient have difficulty dressing or bathing?: Yes Independently performs ADLs?: No Communication: Independent Dressing (OT): Independent with device (comment) Grooming: Needs assistance Is this a change from baseline?: Change from baseline, expected to last >3 days Feeding: Needs assistance Is this a change from baseline?: Change from baseline, expected to last >3 days Bathing: Needs assistance Is this a change from baseline?: Pre-admission baseline Toileting: Needs assistance Is this a change from baseline?: Pre-admission baseline In/Out Bed: Needs assistance Is this a change from baseline?: Pre-admission baseline Walks in Home: Needs assistance Is this a change from baseline?: Pre-admission baseline Does the patient have difficulty walking or climbing stairs?: Yes Weakness of Legs: Right Weakness of Arms/Hands: Right  Permission Sought/Granted                  Emotional Assessment Appearance:: Appears stated age Attitude/Demeanor/Rapport: Engaged Affect (typically observed): Accepting Orientation: : Oriented to Self,Oriented to Place,Oriented to  Time,Oriented to Situation      Admission diagnosis:  Stroke (cerebrum) [facility]) [I63.9] Patient Active Problem List   Diagnosis Date Noted  . Stroke (cerebrum) ([place]) 05/23/2020  . Acute blood loss anemia   .  Atypical chest pain   . Chronic duodenal ulcer with hemorrhage   . Gastrointestinal hemorrhage 01/07/2020  . Decreased hemoglobin   . Permanent atrial fibrillation ([place]) 10/24/2019  . Hypercholesterolemia 10/24/2019  . Current chronic use of systemic steroids 10/24/2019  .  Macrocytic anemia 10/24/2019  . Dehiscence of amputation stump ([place])   . Wound infection 05/09/2018  . AKI (acute kidney injury) ([place])   . S/P BKA (below knee amputation) unilateral, right ([place])   . Below-knee amputation with complication, initial encounter ([place])   . Tachypnea   . Post-operative pain   . Subacute osteomyelitis, right ankle and foot ([place])   . Paralysis of right lower extremity ([place])   . TIA (transient ischemic attack) 03/24/2018  . Encephalopathy 03/24/2018  . Cellulitis 03/15/2018  . Hypernatremia 03/15/2018  . Persistent atrial fibrillation ([place]) 11/10/2017  . Severe muscle deconditioning 11/10/2017  . Hemorrhage 10/22/2017  . Coronary artery disease involving native coronary artery of native heart without angina pectoris 10/22/2017  . Acute hypoxemic respiratory failure ([place]) 07/17/2017  . Aspiration syndrome, subsequent encounter 07/11/2017  . Aspiration pneumonia ([place]) 07/01/2017  . Acute encephalopathy 07/01/2017  . Thrombocytopenia ([place]) 07/01/2017  . Chronic diastolic (congestive) heart failure ([place]) 07/01/2017  . Anemia of chronic disease 07/01/2017  . Elevated troponin 07/01/2017  . Chronic atrial fibrillation 07/01/2017  . Goals of care, counseling/discussion   . Palliative care by specialist   . Acute metabolic encephalopathy 70/17/7939  . Stage 3a chronic kidney disease   . CAP (community acquired pneumonia) 04/17/2017  . Hallux rigidus, right foot 03/10/2016  . Lumbosacral spondylosis without myelopathy 10/29/2015  . Memory difficulty 09/22/2015  . Abnormality of gait 09/22/2015  . Hyperglycemia 07/06/2015  . Chronic pain 07/06/2015  . Chronic insomnia 03/30/2015  . Transient alteration of awareness 03/30/2015  . Abnormal liver function   . Altered mental status 01/31/2015  . Essential hypertension 01/31/2015  . Constipation 01/31/2015  . Hypothyroidism 01/31/2015  . Seizure disorder ([place]) 01/31/2015  . Bladder outlet obstruction 01/31/2015   . GERD (gastroesophageal reflux disease) 01/31/2015  . Chronic back pain 01/31/2015  . Acute kidney injury ([place]) 01/31/2015   PCP:  [name], MD Pharmacy:   [facility] ORDER) [place], [place] [address] [place] [address] Phone: [phone] Fax: [place], [address] AT [facility] OF [place] [address] [place] [address] Phone: [phone] Fax: [phone]     Social Determinants of Health ([place]) Interventions    Readmission Risk Interventions Readmission Risk Prevention Plan 01/11/2020  Transportation Screening Complete  PCP or Specialist Appt within 5-7 Days Complete  Home Care Screening Complete  Medication Review (RN CM) Complete  Some recent data might be hidden   Reinaldo Raddle, RN, BSN  Trauma/Neuro ICU Case Manager [phone]

## 2020-05-25 NOTE — Progress Notes (Signed)
  Speech Language Pathology Treatment: Dysphagia  Patient Details Name: [name] MRN: [id number] DOB: 22-Sep-1933 Today's Date: 05/25/2020 Time: 0092-3300 SLP Time Calculation (min) (ACUTE ONLY): 30 min  Assessment / Plan / Recommendation Clinical Impression  Pt's wife and son present for session during consumption of oatmeal and nectar liquids. Wife reported significant residue in right sided buccal cavity last night with meal. No significant with the puree texture (oatmeal) and therapist/pt/family agree to downgrade to Dys 1 (puree). Wife concerned with frequent use of [name] and educated to gather/contain and transit his saliva versus frequent suctioning unless mucous produced from cough. Delayed throat clear only and he orally held with prolonged bolus formation and transit the intermittently. Continue with nectar thick, downgraded to puree, clear oral cavity after meals, 2 swallows and remain upright after meals (history of esophageal dysphagia.    HPI HPI: [name] is a 87 y.o. male with a PMHx of PAD with chronic wound s/p right BKA in 2020, CAD s/p stent 2002, CHF, apiration pna x 3, AF off AC, complex partial seizure disorder, hypothyroidism, HLD, HTN, duodenal ulcer with GIB 2021, CKD III, and dementia. Admitted with acute onset of speech difficulty, oatmeal leaking from his mouth and right arm weakness. MRI Patchy abnormal diffusion at the left perirolandic cortex most resembling acute ischemia. Otherwise stable chronic small vessel disease since the 2020 MRI; Chronic cervicomedullary junction and C1 level spinal stenosis in the setting of severe spinal degeneration. Wife reported progressive cognitive decline past 1-2 years- med management is done "together" with full supervision from her. She handles finances.      SLP Plan  Continue with current plan of care       Recommendations  Diet recommendations: Dysphagia 1 (puree);Nectar-thick liquid Liquids provided via:  Cup Medication Administration: Crushed with puree Supervision: Staff to assist with self feeding Compensations: Slow rate;Small sips/bites;Multiple dry swallows after each bite/sip;Lingual sweep for clearance of pocketing Postural Changes and/or Swallow Maneuvers: Seated upright 90 degrees;Upright 30-60 min after meal                General recommendations: Rehab consult Oral Care Recommendations: Oral care BID Follow up Recommendations: Inpatient Rehab SLP Visit Diagnosis: Dysphagia, unspecified (R13.10) Plan: Continue with current plan of care       [name], [name] 05/25/2020, 10:09 AM

## 2020-05-25 NOTE — Progress Notes (Signed)
Inpatient Rehabilitation Admissions Coordinator  I met at bedside with patient and his wife. We discussed goals an expectations of a possible CIR admit. They both prefer CIR admit. I await medical work up completion, and then would plan CIR admit.  Danne Baxter, RN, MSN Rehab Admissions Coordinator [phone] 05/25/2020 2:42 PM

## 2020-05-25 NOTE — Progress Notes (Signed)
STROKE TEAM PROGRESS NOTE   INTERVAL HISTORY No acute events MRI planned for today Differential diagnosis discussed with patient and wife at bedside. He thinks he biting noted on oral mucosa occurred a week ago. He is reluctant to go to MRI but agrees to proceed with study. Plan of care explained and next steps in developing plan of care discussed. Questions answered. Wife requests wound care consult for amputation concerns. Nurse to enter.   Vitals:   05/25/20 0600 05/25/20 0700 05/25/20 0800 05/25/20 0900  BP: (!) 171/96 (!) 166/100 (!) 159/69 (!) 164/107  Pulse: (!) 101 (!) 102 (!) 111 (!) 107  Resp: 18 15 12 18   Temp:   97.8 F (36.6 C)   TempSrc:   Oral   SpO2: 100% 95% 98% 97%  Weight:      Height:       CBC:  Recent Labs  Lab 05/23/20 0847 05/23/20 0852  WBC 7.1  --   NEUTROABS 5.1  --   HGB 10.5* 10.2*  HCT 33.5* 30.0*  MCV 104.0*  --   PLT 123*  --    Basic Metabolic Panel:  Recent Labs  Lab 05/23/20 0847 05/23/20 0852  NA 140 140  K 4.4 4.3  CL 106 104  CO2 23  --   GLUCOSE 153* 152*  BUN 36* 38*  CREATININE 1.39* 1.40*  CALCIUM 8.3*  --    Lipid Panel:  Recent Labs  Lab 05/24/20 0715  CHOL 124  TRIG 84  HDL 56  CHOLHDL 2.2  VLDL 17  LDLCALC 51   HgbA1c:  Recent Labs  Lab 05/24/20 0715  HGBA1C 5.1   Urine Drug Screen: No results for input(s): LABOPIA, COCAINSCRNUR, LABBENZ, AMPHETMU, THCU, LABBARB in the last 168 hours.  Alcohol Level No results for input(s): ETH in the last 168 hours.  IMAGING past 24 hours CT HEAD WO CONTRAST  Result Date: 05/24/2020 CLINICAL DATA:  Stroke follow-up. EXAM: CT HEAD WITHOUT CONTRAST TECHNIQUE: Contiguous axial images were obtained from the base of the skull through the vertex without intravenous contrast. COMPARISON:  May 23, 2020 FINDINGS: Brain: Findings concerning for left perirolandic acute ischemia were better characterized on prior MRI. No substantial edema or mass effect in this region. No  evidence of interval/new acute large vascular territory infarct. No mass occupying hemorrhagic transformation. Similar generalized cerebral volume loss with ex vacuo ventricular dilation and chronic microvascular ischemic disease. No evidence of a mass lesion or abnormal mass effect. Vascular: Calcific atherosclerosis. No hyperdense vessel identified. Skull: No acute fracture. Sinuses/Orbits: Clear sinuses.  Unremarkable orbits. Other: No mastoid effusions. IMPRESSION: Findings concerning for left perirolandic acute ischemia were better characterized on prior MRI. No substantial edema or mass effect in this region. No mass occupying hemorrhagic transformation. Electronically Signed   By: Margaretha Sheffield MD   On: 05/24/2020 11:40   PHYSICAL EXAM Constitutional: Appears well-developed and well-nourished.  Psych: Affect appropriate to situation Eyes: No scleral injection HENT: No OP obstrucion Head: Normocephalic.  Cardiovascular: Normal rate and regular rhythm.  Respiratory: Effort normal and breath sounds normal to anterior ascultation GI: Soft.  No distension. There is no tenderness.  Skin: WDI. Purplish discoloration to BUEs.   Neuro: Mental Status: Patient is awake, alert. Unable to answer orientation questions on first exam due to expressive aphasia. Patient is unable to give a clear and coherent history. Marland Kitchen Speech is not fluent. Can not name objects or repeat sentence. Comprehension is intact.  Cranial Nerves: II: Visual Fields  are full. Left gaze preference on arrival, later overcome. Pupils are equal, round, and reactive to light. No blink to threat originally on right.  III,IV, VI: EOMI without ptosis or diploplia.  V: Facial sensation is symmetric to temperature VII: Facial movement is symmetric.  VIII: hearing is intact to voice X: Uvula elevates symmetrically XI: Shoulder shrug is symmetric. XII: tongue is midline without atrophy or fasciculations.  Motor: Tone is normal. Bulk  is normal. Strength right grip 4/5, right biceps 3+/5, right triceps 4/5. LLE/LUE 5/5.  Sensory: Sensation is symmetric to light touch and temperature in the arms and legs.  Plantars: Toes are downgoing bilaterally.  Cerebellar: FNF intact.  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 No LVO on imaging. + acute ischemia noted left perirolandic cortex on MRI. tPA was given. Patient's presenting symptoms beginning to resolve somewhat during and after tPA. NP alerted RN to left arm purplish hematoma at bedside post tPA. Symptoms not consistent with his seizure presentations in the past. Depakote level was low on admission.   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 No acute cortically based infarct or acute intracranial hemorrhage identified. ASPECTS 10. 2. Chronic white matter disease appears stable since 2020.  CTA head and neck  1. Negative for emergent large vessel occlusion. Left MCA branches appear stable from a 2020 CTA. This was reviewed in person with Dr. Roland Rack at [phone] hours. 2. Positive for chronic arterial tortuosity and calcified plaque in the head and neck. 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 - Moderate Left Vertebral V4 segment. 3. Chronic very severe cervical spine degeneration. 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  MRI brain 1. Patchy abnormal diffusion at the left perirolandic cortex most resembling acute ischemia. No T2  FLAIR changes or hemorrhage, mass effect at this time. This was discussed in person with Dr. Roland Rack at [phone] hours. 2. Otherwise stable chronic small vessel disease since the 2020 MRI. 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  2D Echo EF 55-50%, severe LVH, Left atrial dilatation. No thrombus, wall motion abnormality or shunt found.   LDL 51  HgbA1c 5.1  VTE prophylaxis -     Diet   DIET DYS 2 Room service appropriate? Yes with Assist; Fluid consistency: Nectar Thick     Patient stopped Eliquis for his afib in 2020 on his own.   Per discussion with pharmacist, Mina Marble, re: impaired renal function and dysphagia diet, Eliquis 5mg  BID is recommended dose. Ok to be crushed. He is just at the cut off for his elevated Ctn dosing. Monitor daily BMP.   Wound consult for R amputation completed  Therapy recommendations: CIR  Disposition:  CIR pending approval and acceptance  Hypertension  Home meds:  Cardizem, lasix and metoprolol restarted   Stable . Permissive hypertension (OK if < 220/120) but gradually normalize in 5-7 days . Long-term BP goal normotensive  Hyperlipidemia  LDL 51, at goal < 70  High intensity statin: Lipitor 80mg  daily Continue statin at discharge  Dysphagia -SLP following -  On dysphagia diet -Crush meds recommended -Dang, Rph, asked to review meds for crush appropriateness  CKD3 -baseline ctn approx 1.3 -Monitor BMP daily   CHF -Lasix restarted -Saline locked  -Monitor po intake  -Unable to obtain daily weights d/t amputation  Right LE amputation care -Wound care RN consulted and provided recs and ordered wound care  -Monitor pink area in the knee region  Other Stroke Risk Factors  Advanced Age >/= 36   Former Cigarette smoker: 5 pack year history  Coronary artery disease: Stent to RCA, Penta stent, 99% reduced to 0% 2002.  CHF, chronic diastolic   Other  Active Problems    Hospital day # 2 .  Check MRI scan of the brain and if left MCA branch infarct is confirmed we will consider switching to Pradaxa for anticoagulation as patient had hematoma on Eliquis and does not want to go back on.  Long discussion with patient and wife and answered questions.  Mobilize out of bed.  Continue therapy consults.  Transfer to neurology floor bed.  Likely transfer to inpatient rehab in a few days.  Discussed with patient, wife and pharmacist This patient is critically ill and at significant risk of neurological worsening, death and care requires constant monitoring of vital signs, hemodynamics,respiratory and cardiac monitoring, extensive review of multiple databases, frequent neurological assessment, discussion with family, other specialists and medical decision making of high complexity.I have made any additions or clarifications directly to the above note.This critical care time does not reflect procedure time, or teaching time or supervisory time of PA/NP/Med Resident etc but could involve care discussion time.  I spent 30 minutes of neurocritical care time  in the care of  this patient.    Antony Contras, MD  To contact Stroke Continuity provider, please refer to [email or web address] After hours, contact General Neurology

## 2020-05-25 NOTE — Progress Notes (Signed)
Physical Therapy Treatment Patient Details Name: [name] MRN: [id number] DOB: February 22, 1934 Today's Date: 05/25/2020    History of Present Illness 87 yo male admitted 3/14 after going unresponsive with breakfast and concerns for aspiration, R arm weakness, L gaze preference and non verbal. TPa given 3/14. MRI demonstrates patchy abnormal diffusion at the left perirolandic cortex most resembling acute ischemia. 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    PT Comments    Pt displaying possible R side neglect through tendency to look towards L and necessity to remind pt to attend to his R side with mobility. Pt needing cues to bring his R leg and arm towards EOB with bed mobility and to maintain R hand on the bed with lateral scooting to L on EOB. Pt needing min-modA to roll, modAx2 to transition to sit or supine, and maxAx2 for lateral scooting L on EOB. Positioned pt end of session with R arm elevated, L heel floating, and R residual limb floating for pressure relief. Pt would benefit from trying to enter/exit bed on R side in upcoming sessions as this would simulate his home set-up. Will continue to follow acutely. Current recommendations remain appropriate.    Follow Up Recommendations  CIR (vs home with HH PT and hoyer lift)     Equipment Recommendations  Other (comment) (if home, hoyer lift)    Recommendations for Other Services       Precautions / Restrictions Precautions Precautions: Fall Precaution Comments: very thin skin, skin tears easily with multiple sites of skin breakdown on lateral aspect of R residual limb, skin tear under BP cuff; R neglect? Required Braces or Orthoses: Other Brace (R prosthetic not used due to wounds on R LE at this time) Restrictions Weight  Bearing Restrictions: No    Mobility  Bed Mobility Overal bed mobility: Needs Assistance Bed Mobility: Rolling;Sidelying to Sit;Sit to Supine Rolling: Min assist;Mod assist Sidelying to sit: Mod assist;+2 for physical assistance   Sit to supine: Mod assist;+2 for physical assistance   General bed mobility comments: Min-modA for rolling bilaterally for truncal and LE translation and cues to reach across body for contralateral rail. Management of legs and trunk to transition to sit and back to supine L EOB with modAx2. Pt forgetting to manage R side towards EOB to square with EOB upon coming to sit, needing cues to attend to R side.    Transfers Overall transfer level: Needs assistance   Transfers: Lateral/Scoot Transfers          Lateral/Scoot Transfers: Max assist;+2 physical assistance;+2 safety/equipment General transfer comment: MaxAx2 with cues for L hand on L HOB rail and R hand on bed to push up to clear buttocks for lateral scoot to L, 3x. Pt not tolerating L knee block.  Ambulation/Gait             General Gait Details: unable   Stairs             Wheelchair Mobility    Modified Rankin (Stroke Patients Only) Modified Rankin (Stroke Patients Only) Pre-Morbid Rankin Score: Moderately severe disability Modified Rankin: Severe disability     Balance Overall balance assessment: Needs assistance Sitting-balance support: Bilateral upper extremity supported;Feet supported Sitting balance-Leahy Scale: Poor Sitting balance - Comments: fair to poor, requiring min PT  assist for posterior support with fatigue Postural control: Posterior lean     Standing balance comment: unable                            Cognition Arousal/Alertness: Awake/alert Behavior During Therapy: Flat affect Overall Cognitive Status: History of cognitive impairments - at baseline Area of Impairment: Following commands;Safety/judgement;Problem solving                        Following Commands: Follows one step commands with increased time Safety/Judgement: Decreased awareness of safety;Decreased awareness of deficits   Problem Solving: Requires verbal cues;Requires tactile cues;Slow processing;Decreased initiation;Difficulty sequencing General Comments: Pt with poor awareness of deficits, displaying possible R side neglect as he needed reminders to attend to R side and look to R.      Exercises General Exercises - Upper Extremity Shoulder Flexion: AROM;Strengthening;Right;10 reps;Seated Elbow Flexion: AROM;Strengthening;Right;10 reps;Seated General Exercises - Lower Extremity Heel Slides: AROM;Both;10 reps;Supine Straight Leg Raises: Strengthening;Both;10 reps;Supine    General Comments General comments (skin integrity, edema, etc.): Positioned pt at end of session with R UE elevated on pillow, L heel floating, and R residual limb floating      Pertinent Vitals/Pain Pain Assessment: Faces Faces Pain Scale: Hurts a little bit Pain Location: generalized, during mobility Pain Descriptors / Indicators: Grimacing;Discomfort Pain Intervention(s): Limited activity within patient's tolerance;Monitored during session;Repositioned    Home Living                      Prior Function            PT Goals (current goals can now be found in the care plan section) Acute Rehab PT Goals Patient Stated Goal: to improve PT Goal Formulation: With patient/family Time For Goal Achievement: 06/07/20 Potential to Achieve Goals: Good Progress towards PT goals: Progressing toward goals    Frequency    Min 4X/week      PT Plan Frequency needs to be updated    Co-evaluation              AM-PAC PT "6 Clicks" Mobility   Outcome Measure  Help needed turning from your back to your side while in a flat bed without using bedrails?: A Lot Help needed moving from lying on your back to sitting on the side of a flat bed without using  bedrails?: A Lot Help needed moving to and from a bed to a chair (including a wheelchair)?: A Lot Help needed standing up from a chair using your arms (e.g., wheelchair or bedside chair)?: Total Help needed to walk in hospital room?: Total Help needed climbing 3-5 steps with a railing? : Total 6 Click Score: 9    End of Session Equipment Utilized During Treatment: Gait belt Activity Tolerance: Patient tolerated treatment well Patient left: in bed;with call bell/phone within reach;with bed alarm set;with family/visitor present Nurse Communication: Mobility status (skin tears; positioning of limbs to prevent further skin breakdown) PT Visit Diagnosis: Other abnormalities of gait and mobility (R26.89);Muscle weakness (generalized) (M62.81);Other symptoms and signs involving the nervous system (R29.898)     Time: 6644-0347 PT Time Calculation (min) (ACUTE ONLY): 43 min  Charges:  $Therapeutic Exercise: 8-22 mins $Therapeutic Activity: 23-37 mins                     Moishe Spice, PT, DPT Acute Rehabilitation Services  Pager: [phone] Office: [phone]  Maretta Bees Pettis 05/25/2020, 6:26 PM

## 2020-05-25 NOTE — Consult Note (Signed)
Physical Medicine and Rehabilitation Consult Reason for Consult: Right side weakness with left gaze preference and inability to speak Referring Physician: Dr. Leonel Ramsay   HPI: [name] is a 87 y.o. right-handed male with history of atrial fibrillation who had been on Eliquis in the past but self DC'd due to a leg hematoma, CAD with stenting maintained on aspirin, CKD stage III, hypertension, hyperlipidemia, seizure disorder maintained on Depakote followed by Dr. 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  Per chart review patient lives with spouse.  1 level home with ramped entrance.  Patient uses transfer board in and out of wheelchair he no longer uses a prosthesis.  Patient's wife assists with bathing and dressing as well as pericare.  Presented 05/23/2020 with right side weakness left gaze preference and inability to speak.  Cranial CT scan negative for acute changes.  CT angiogram head and neck negative for emergent large vessel occlusion.  Left MCA branches appeared stable from 2020 CTA.  Patient did receive TPA.  EEG negative for seizure but suggestive of mild to moderate diffuse encephalopathy.  MRI showed patchy abnormal diffusion at the left perirolandic cortex most resembling acute ischemia.  No substantial edema or mass-effect in that region.  Echocardiogram with ejection fraction of 55 to 60% no wall motion abnormalities.  Admission chemistries unremarkable except glucose 153 BUN 36 creatinine 1.39, hemoglobin 10.5, valproic acid level 41.  Neurology follow-up work-up currently ongoing.  Dysphagia #2 nectar thick liquid diet.  Therapy evaluations completed due to patient decreased functional mobility recommendations of physical medicine rehab consult. The patient has been dependent upon his wife for a couple years now.  He has required sliding board transfers to get in and out of bed to go to doctors offices.   The patient has a prosthetic for his right BKA but he has never used it due to skin issues.  The patient also has severe limitations in the left shoulder and was advised to have shoulder replacement but his medical status would not allow it.  Since this hospitalization he has had slurring of speech and right shoulder weakness as well as his swallowing difficulties.  Review of Systems  Constitutional: Negative for chills and fever.  HENT: Negative for hearing loss.   Eyes: Negative for blurred vision and double vision.  Respiratory: Negative for cough and shortness of breath.   Cardiovascular: Positive for palpitations and leg swelling.  Gastrointestinal: Positive for constipation. Negative for heartburn, nausea and vomiting.       GERD  Genitourinary: Negative for dysuria, flank pain and hematuria.       Incontinence of bowel and bladder  Musculoskeletal: Positive for joint pain and myalgias.  Skin: Negative for rash.  Neurological: Positive for dizziness, speech change and weakness.  Psychiatric/Behavioral: Positive for memory loss.  All other systems reviewed and are negative.  Past Medical History:  Diagnosis Date  . Abnormality of gait 09/22/2015  . Arthritis   . Atrial fibrillation ([place])   . CAD (coronary artery disease)    Stent to RCA, Penta stent, 99% reduced to 0% 2002.  . Cancer ([place])    skin CA removed from back  . 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 Chronic insomnia 03/30/2015  . Chronic kidney disease, stage 3 ([place])   . Complication of anesthesia    trouble waking up  . GERD (gastroesophageal reflux disease)   .  Hypercholesteremia   . Hypertension   . Hypothyroidism   . Iron deficiency anemia   . Memory difficulty 09/22/2015  . Osteoarthritis   . PAD (peripheral artery disease) ([place])   . Pneumonia   . Seizures ([place])   . Sepsis ([place]) 05/2017  . Transient alteration of awareness 03/30/2015  . Vertigo    hx of  . Vitamin D deficiency     Past Surgical History:  Procedure Laterality Date  . AMPUTATION Right 03/28/2018   Procedure: AMPUTATION BELOW KNEE;  Surgeon: Newt Minion, MD;  Location: [facility];  Service: Orthopedics;  Laterality: Right;  . APPLICATION OF WOUND VAC Right 01/23/2019   Procedure: Application Of  Prevena Wound Vac;  Surgeon: Newt Minion, MD;  Location: [facility];  Service: Orthopedics;  Laterality: Right;  . BACK SURGERY    . BIOPSY  01/08/2020   Procedure: BIOPSY;  Surgeon: Gatha Mayer, MD;  Location: [facility];  Service: Endoscopy;;  . ESOPHAGOGASTRODUODENOSCOPY (EGD) WITH PROPOFOL N/A 01/08/2020   Procedure: ESOPHAGOGASTRODUODENOSCOPY (EGD) WITH PROPOFOL;  Surgeon: Gatha Mayer, MD;  Location: [facility];  Service: Endoscopy;  Laterality: N/A;  . EYE SURGERY     Bilateral Cataract surgery   . HERNIA REPAIR    . I & D EXTREMITY Right 05/10/2015   Procedure: IRRIGATION AND DEBRIDEMENT EXTREMITY;  Surgeon: Leanora Cover, MD;  Location: [facility];  Service: Orthopedics;  Laterality: Right;  . KNEE ARTHROPLASTY     right knee X 2; left knee once  . LAMINECTOMY     X 6  . LEG AMPUTATION BELOW KNEE Right 03/28/2018  . POSTERIOR CERVICAL FUSION/FORAMINOTOMY  01/28/2012   Procedure: POSTERIOR CERVICAL FUSION/FORAMINOTOMY LEVEL 3;  Surgeon: [name], MD;  Location: [facility];  Service: Neurosurgery;  Laterality: Left;  Posterior Cervical Five-Thoracic One Fusion, Arthrodesis with LEFT Cervical Seven-thoracic One Laminectomy, Foraminotomy and Resection of Synovial Cyst  . POSTERIOR CERVICAL FUSION/FORAMINOTOMY N/A 01/29/2013   Procedure: POSTERIOR CERVICAL FUSION/FORAMINOTOMY LEVEL 1 and C2-5 Posteriolateral Arthrodesis;  Surgeon: [name], MD;  Location: [facility];  Service: Neurosurgery;  Laterality: N/A;  C2-C3 Laminectomy C2-C3 posterior cervical arthrodesis  . STUMP REVISION Right 01/23/2019   Procedure: REVISION RIGHT BELOW KNEE AMPUTATION;  Surgeon: Newt Minion, MD;  Location: [facility];  Service: Orthopedics;  Laterality: Right;  . TONSILLECTOMY     Family History  Problem Relation Age of Onset  . Hypertension Mother   . Cancer Mother   . Kidney failure Father   . Heart disease Father    Social History:  reports that he has quit smoking. He has a 5.00 pack-year smoking history. He has never used smokeless tobacco. He reports previous alcohol use. He reports that he does not use drugs. Allergies:  Allergies  Allergen Reactions  . Eliquis [Apixaban] Other (See Comments)    bleeding  . Demerol [Meperidine] Nausea And Vomiting  . Keppra [Levetiracetam] Other (See Comments)    Causes sleepiness, mental status changes  . Vimpat [Lacosamide] Other (See Comments)    Over sedated    Medications Prior to Admission  Medication Sig Dispense Refill  . alendronate (FOSAMAX) 70 MG tablet Take 70 mg by mouth every Wednesday. Remain upright for 30-60 minutes.    Marland Kitchen aspirin EC 81 MG tablet Take 81 mg by mouth daily.    . Calcium Carb-Cholecalciferol (CALCIUM 600+D) 600-800 MG-UNIT TABS Take 1 tablet by mouth daily.    Marland Kitchen diltiazem (CARDIZEM CD) 240  MG 24 hr capsule TAKE 1 CAPSULE BY MOUTH EVERY MORNING. HOLD IF SYSTOLIC BLOOD PRESSURE IS LESS THAN 100 (Patient taking differently: Take 240 mg by mouth daily. Hold if systolic blood pressure is less than 100.) 90 capsule 3  . divalproex (DEPAKOTE ER) 250 MG 24 hr tablet Take 250 mg by mouth in the morning and at bedtime.     . docusate sodium (COLACE) 100 MG capsule Take 100 mg by mouth daily as needed for mild constipation.    Marland Kitchen doxazosin (CARDURA) 4 MG tablet TAKE 1 TABLET(4 MG) BY MOUTH AT BEDTIME (Patient taking differently: Take 4 mg by mouth at bedtime.) 90 tablet 3  . furosemide (LASIX) 20 MG tablet Take 1 tablet (20 mg total) by mouth daily. 90 tablet 3  . HYDROcodone-acetaminophen (NORCO) 10-325 MG tablet Take 1 tablet by mouth every 4 (four) hours as needed for pain.    Marland Kitchen levothyroxine (SYNTHROID,  LEVOTHROID) 150 MCG tablet Take 150 mcg by mouth daily before breakfast.    . lidocaine (LIDODERM) 5 % Place 1-5 patches onto the skin daily. Remove & Discard patch within 12 hours or as directed by MD (lower back and shoulders)    . metoprolol tartrate (LOPRESSOR) 25 MG tablet TAKE 1 TABLET BY MOUTH TWICE DAILY WITH A MEAL OR IMMEDIATELY AFTER (Patient taking differently: Take 25 mg by mouth 2 (two) times daily. WITH A MEAL OR IMMEDIATELY AFTER) 180 tablet 3  . Multiple Vitamins-Minerals (CERTAVITE/ANTIOXIDANTS) TABS Take 1 tablet by mouth every evening.     . nitrofurantoin (MACRODANTIN) 100 MG capsule Take 100 mg by mouth daily.    . nitroGLYCERIN (NITROSTAT) 0.4 MG SL tablet Place 1 tablet (0.4 mg total) under the tongue every 5 (five) minutes as needed for chest pain (call 911 afer 3 doses and if chest pain persists). 25 tablet 11  . polyethylene glycol (MIRALAX / GLYCOLAX) 17 g packet Take 17 g by mouth at bedtime.    . potassium chloride SA (KLOR-CON) 20 MEQ tablet Take 0.5 tablets (10 mEq total) by mouth daily. 45 tablet 3  . pravastatin (PRAVACHOL) 40 MG tablet Take 40 mg by mouth every evening.   2  . predniSONE (DELTASONE) 1 MG tablet Take 4 mg by mouth daily with breakfast.    . predniSONE (DELTASONE) 5 MG tablet Take 5 mg by mouth daily.    . mupirocin ointment (BACTROBAN) 2 % Apply 1 application topically 2 (two) times daily. Apply to the affected area 2 times a day (Patient not taking: Reported on 05/23/2020) 22 g 3  . pantoprazole (PROTONIX) 40 MG tablet Take 1 tablet (40 mg total) by mouth 2 (two) times daily. (Patient not taking: Reported on 05/23/2020) 60 tablet 0  . pentoxifylline (TRENTAL) 400 MG CR tablet TAKE 1 TABLET(400 MG) BY MOUTH THREE TIMES DAILY WITH MEALS (Patient not taking: Reported on 05/23/2020) 90 tablet 3  . sulfamethoxazole-trimethoprim (BACTRIM DS) 800-160 MG tablet Take 1 tablet by mouth 2 (two) times daily. (Patient not taking: No sig reported) 20 tablet 0  .  sulfamethoxazole-trimethoprim (BACTRIM DS) 800-160 MG tablet Take 1 tablet by mouth 2 (two) times daily. (Patient not taking: No sig reported) 30 tablet 0    Home: Home Living Family/patient expects to be discharged to:: Private residence Living Arrangements: Spouse/significant other Available Help at Discharge: Family,Available 24 hours/day Type of Home: House Home Access: [place]: One level Bathroom Shower/Tub: Chiropodist: Handicapped height Bathroom Accessibility: Yes Home Equipment: Environmental consultant -  4 wheels,Cane - single point,Bedside commode,Shower seat,Wheelchair - manual,Grab bars - tub/shower,Transport chair,Hospital bed,Other (comment) (prosthetic) Additional Comments: transfer board  Lives With: Spouse  Functional History: Prior Function Level of Independence: Needs assistance Gait / Transfers Assistance Needed: pt uses transfer board for transfer in and out of w/c, does not don prosthesis anymore ADL's / Homemaking Assistance Needed: Pt's wife dresses, bathes, performs pericare for pt Comments: wife reports incontinence of stool is not abnormal for patient Functional Status:  Mobility: Bed Mobility Overal bed mobility: Needs Assistance Bed Mobility: Rolling,Sidelying to Sit,Sit to Supine Rolling: Mod assist,+2 for physical assistance Sidelying to sit: Mod assist,+2 for physical assistance Sit to supine: Mod assist,+2 for physical assistance General bed mobility comments: mod +2 for rolling bilaterally for truncal and LE translation, trunk elevation/lowering, and LE translation to/from EOB. PT and OT providing assist for boost up in bed. Transfers General transfer comment: defer to next session Ambulation/Gait General Gait Details: unable    ADL: ADL Overall ADL's : Needs assistance/impaired Eating/Feeding: Maximal assistance,Bed level Eating/Feeding Details (indicate cue type and reason): positioned for lunch tray with pillows in  chair position General ADL Comments: total (A) for all adls at this time. pt would benefit from hoyer lift only due to risk for falls and skin break down  Cognition: Cognition Overall Cognitive Status: History of cognitive impairments - at baseline Arousal/Alertness: Awake/alert Orientation Level: Oriented X4 Attention: Sustained Sustained Attention: Appears intact Memory: Impaired Memory Impairment: Decreased recall of new information Awareness: Impaired Awareness Impairment: Anticipatory impairment Problem Solving: Impaired Problem Solving Impairment: Verbal basic Safety/Judgment: Impaired Cognition Arousal/Alertness: Awake/alert Behavior During Therapy: Flat affect Overall Cognitive Status: History of cognitive impairments - at baseline Area of Impairment: Following commands,Safety/judgement,Problem solving Following Commands: Follows one step commands with increased time Safety/Judgement: Decreased awareness of safety Problem Solving: Requires verbal cues,Requires tactile cues,Slow processing,Decreased initiation,Difficulty sequencing General Comments: pt drowsy during session, but participatory. Pt requires step-by-step cuing for mobility, especially moving to/from EOB.  Blood pressure (!) 174/95, pulse 99, temperature 98.3 F (36.8 C), temperature source Oral, resp. rate 17, height 5' 9.02" (1.753 m), weight 70.1 kg, SpO2 97 %. Physical Exam Vitals and nursing note reviewed.  Constitutional:      Appearance: He is normal weight.  Eyes:     Extraocular Movements: Extraocular movements intact.     Conjunctiva/sclera: Conjunctivae normal.     Pupils: Pupils are equal, round, and reactive to light.  Cardiovascular:     Rate and Rhythm: Normal rate and regular rhythm.     Heart sounds: Normal heart sounds.  Pulmonary:     Effort: Pulmonary effort is normal.     Breath sounds: Normal breath sounds. No stridor.  Abdominal:     General: Abdomen is flat. Bowel sounds are  normal. There is no distension.     Palpations: Abdomen is soft.  Skin:    General: Skin is warm and dry.  Neurological:     Mental Status: He is alert and oriented to person, place, and time.     Cranial Nerves: Dysarthria present.     Comments: Patient is alert no acute distress.  He does have an element of expressive aphasia and some dysarthria.  He was able to provide his name.  He was not able to name objects.  Followed inconsistent commands.  He was easily distracted. Right central 7 Upper extremity coordination difficult to assess secondary to bilateral shoulder weakness and contractures does have reduced finger to thumb opposition speed Gait not tested  due to weakness and BKA  Psychiatric:        Attention and Perception: He is inattentive.        Mood and Affect: Mood normal.        Speech: Speech is delayed.        Behavior: Behavior is slowed.   Oriented to person and place and month but not day and date Musculoskeletal there is evidence of left shoulder crepitus and contracture with limitations of abduction forward flexion Right shoulder no crepitus good passive range. Motor strength is 3 - bilateral deltoid 4 bilateral bicep tricep finger flexors and extensors 3 right hip flexor 4 left hip flexor 4 left knee extensor 4 left ankle dorsiflexor Extremity right BKA well-healed no edema no tenderness Sensation intact to light touch left lower limb, upper limbs have paresthesias with light touch but able to localize Results for orders placed or performed during the hospital encounter of 05/23/20 (from the past 24 hour(s))  Hemoglobin A1c     Status: None   Collection Time: 05/24/20  7:15 AM  Result Value Ref Range   Hgb A1c MFr Bld 5.1 4.8 - 5.6 %   Mean Plasma Glucose 99.67 mg/dL  Lipid panel     Status: None   Collection Time: 05/24/20  7:15 AM  Result Value Ref Range   Cholesterol 124 0 - 200 mg/dL   Triglycerides 84 <150 mg/dL   HDL 56 >40 mg/dL   Total CHOL/HDL Ratio  2.2 RATIO   VLDL 17 0 - 40 mg/dL   LDL Cholesterol 51 0 - 99 mg/dL   CT ANGIO HEAD W OR WO CONTRAST  Result Date: 05/23/2020 CLINICAL DATA:  87 year old male code stroke presentation with left MCA territory implicated. Also history of seizures. EXAM: CT ANGIOGRAPHY HEAD AND NECK TECHNIQUE: Multidetector CT imaging of the head and neck was performed using the standard protocol during bolus administration of intravenous contrast. Multiplanar CT image reconstructions and MIPs were obtained to evaluate the vascular anatomy. Carotid stenosis measurements (when applicable) are obtained utilizing NASCET criteria, using the distal internal carotid diameter as the denominator. CONTRAST:  61mL OMNIPAQUE IOHEXOL 350 MG/ML SOLN COMPARISON:  CTA head and neck 03/24/2018. Plain head CT 0857 hours today. FINDINGS: CTA NECK Skeleton: Chronic severe cervical spine degeneration status post long segment anterior and posterior fusion. Bulky degenerative ligamentous hypertrophy about the odontoid. Evidence of moderate cervicomedullary junction spinal stenosis, also evident by MRI in 2020. No acute osseous abnormality identified. Upper chest: Lower lung volumes with atelectasis. Negative visible superior mediastinum. Other neck: Small volume retained secretions in the pharynx. No other acute neck soft tissue finding. Aortic arch: Stable aortic arch with mild atherosclerosis. Generalized tortuosity of the great vessels. Right carotid system: Tortuous brachiocephalic artery and especially right CCA origin without stenosis. Streak artifact at the right ICA bulb. Relatively mild origin and bulb atherosclerosis with no significant stenosis. Partially retropharyngeal course of the right ICA which remains patent to the skull base. Left carotid system: Tortuous proximal left CCA with a kinked appearance at the thoracic inlet but no stenosis. Mild plaque at the left ICA origin. Moderate calcified plaque at the bulb but no significant  stenosis. The left ICA remains patent to the skull base. Vertebral arteries: Tortuosity and calcified plaque at the proximal right subclavian artery without significant stenosis. Chronic bulky calcified plaque and severe stenosis at the right vertebral artery origin. The right vertebral does remain patent to the skull base, is highly tortuous in the V2 segment.  Tortuous proximal left subclavian artery with calcified plaque but no significant stenosis. Calcified plaque at the left vertebral artery origin resulting in mild or at most moderate stenosis. Tortuous left vertebral artery in the V2 segment, remains patent to the skull base. CTA HEAD Posterior circulation: Bilateral V4 segment calcified plaque with moderate to severe V4 segment stenosis greater on the right (series 6, image 297). Both distal V4 segments remain patent to the basilar. Right PICA origin remains patent. Patent basilar artery with mild irregularity and no significant stenosis. Patent SCA origins. Fetal type bilateral PCA is again noted. Bilateral PCA branches appears stable and mildly irregular. Anterior circulation: Both ICA siphons are patent. Tortuous bilateral siphons with extensive calcified plaque but only mild supraclinoid stenosis on the right. Bilateral ophthalmic and posterior communicating artery origins remain normal. Patent carotid termini, MCA and ACA origins with dominant left and diminutive or absent right ACA A1 segment. Anterior communicating artery and bilateral ACA branches appear stable since 2020. Right MCA M1 segment and bifurcation remain patent. Right MCA branches appear stable since 2020. Left MCA M1 segment and left MCA trifurcation is patent with mild irregularity but no significant stenosis. Left MCA M2 branches appears stable since 2020. And allowing for different contrast timing today, the proximal M3 branches also seem to remain patent. Venous sinuses: Early contrast timing, but grossly patent. Anatomic variants:  Fetal type PCA origins, dominant left ACA A1. Review of the MIP images confirms the above findings IMPRESSION: 1. Negative for emergent large vessel occlusion. Left MCA branches appear stable from a 2020 CTA. This was reviewed in person with Dr. Roland Rack at [id number] hours. 2. Positive for chronic arterial tortuosity and calcified plaque in the head and neck. 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 - Moderate Left Vertebral V4 segment. 3. Chronic very severe cervical spine degeneration. 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 Electronically Signed   By: Genevie Ann M.D.   On: 05/23/2020 09:50   CT HEAD WO CONTRAST  Result Date: 05/24/2020 CLINICAL DATA:  Stroke follow-up. EXAM: CT HEAD WITHOUT CONTRAST TECHNIQUE: Contiguous axial images were obtained from the base of the skull through the vertex without intravenous contrast. COMPARISON:  May 23, 2020 FINDINGS: Brain: Findings concerning for left perirolandic acute ischemia were better characterized on prior MRI. No substantial edema or mass effect in this region. No evidence of interval/new acute large vascular territory infarct. No mass occupying hemorrhagic transformation. Similar generalized cerebral volume loss with ex vacuo ventricular dilation and chronic microvascular ischemic disease. No evidence of a mass lesion or abnormal mass effect. Vascular: Calcific atherosclerosis. No hyperdense vessel identified. Skull: No acute fracture. Sinuses/Orbits: Clear sinuses.  Unremarkable orbits. Other: No mastoid effusions. IMPRESSION: Findings concerning for left perirolandic acute ischemia were better characterized on prior MRI. No substantial edema or mass effect in this region. No mass occupying hemorrhagic transformation. Electronically Signed   By: Margaretha Sheffield MD   On: 05/24/2020 11:40   CT ANGIO NECK W OR WO CONTRAST  Result Date:  05/23/2020 CLINICAL DATA:  87 year old male code stroke presentation with left MCA territory implicated. Also history of seizures. EXAM: CT ANGIOGRAPHY HEAD AND NECK TECHNIQUE: Multidetector CT imaging of the head and neck was performed using the standard protocol during bolus administration of intravenous contrast. Multiplanar CT image reconstructions and MIPs were obtained to evaluate the vascular anatomy. Carotid stenosis measurements (when applicable) are obtained utilizing NASCET criteria, using the distal  internal carotid diameter as the denominator. CONTRAST:  56mL OMNIPAQUE IOHEXOL 350 MG/ML SOLN COMPARISON:  CTA head and neck 03/24/2018. Plain head CT 0857 hours today. FINDINGS: CTA NECK Skeleton: Chronic severe cervical spine degeneration status post long segment anterior and posterior fusion. Bulky degenerative ligamentous hypertrophy about the odontoid. Evidence of moderate cervicomedullary junction spinal stenosis, also evident by MRI in 2020. No acute osseous abnormality identified. Upper chest: Lower lung volumes with atelectasis. Negative visible superior mediastinum. Other neck: Small volume retained secretions in the pharynx. No other acute neck soft tissue finding. Aortic arch: Stable aortic arch with mild atherosclerosis. Generalized tortuosity of the great vessels. Right carotid system: Tortuous brachiocephalic artery and especially right CCA origin without stenosis. Streak artifact at the right ICA bulb. Relatively mild origin and bulb atherosclerosis with no significant stenosis. Partially retropharyngeal course of the right ICA which remains patent to the skull base. Left carotid system: Tortuous proximal left CCA with a kinked appearance at the thoracic inlet but no stenosis. Mild plaque at the left ICA origin. Moderate calcified plaque at the bulb but no significant stenosis. The left ICA remains patent to the skull base. Vertebral arteries: Tortuosity and calcified plaque at the proximal  right subclavian artery without significant stenosis. Chronic bulky calcified plaque and severe stenosis at the right vertebral artery origin. The right vertebral does remain patent to the skull base, is highly tortuous in the V2 segment. Tortuous proximal left subclavian artery with calcified plaque but no significant stenosis. Calcified plaque at the left vertebral artery origin resulting in mild or at most moderate stenosis. Tortuous left vertebral artery in the V2 segment, remains patent to the skull base. CTA HEAD Posterior circulation: Bilateral V4 segment calcified plaque with moderate to severe V4 segment stenosis greater on the right (series 6, image 297). Both distal V4 segments remain patent to the basilar. Right PICA origin remains patent. Patent basilar artery with mild irregularity and no significant stenosis. Patent SCA origins. Fetal type bilateral PCA is again noted. Bilateral PCA branches appears stable and mildly irregular. Anterior circulation: Both ICA siphons are patent. Tortuous bilateral siphons with extensive calcified plaque but only mild supraclinoid stenosis on the right. Bilateral ophthalmic and posterior communicating artery origins remain normal. Patent carotid termini, MCA and ACA origins with dominant left and diminutive or absent right ACA A1 segment. Anterior communicating artery and bilateral ACA branches appear stable since 2020. Right MCA M1 segment and bifurcation remain patent. Right MCA branches appear stable since 2020. Left MCA M1 segment and left MCA trifurcation is patent with mild irregularity but no significant stenosis. Left MCA M2 branches appears stable since 2020. And allowing for different contrast timing today, the proximal M3 branches also seem to remain patent. Venous sinuses: Early contrast timing, but grossly patent. Anatomic variants: Fetal type PCA origins, dominant left ACA A1. Review of the MIP images confirms the above findings IMPRESSION: 1. Negative for  emergent large vessel occlusion. Left MCA branches appear stable from a 2020 CTA. This was reviewed in person with Dr. Roland Rack at [phone] hours. 2. Positive for chronic arterial tortuosity and calcified plaque in the head and neck. 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 - Moderate Left Vertebral V4 segment. 3. Chronic very severe cervical spine degeneration. 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 Electronically Signed   By: Genevie Ann M.D.   On: 05/23/2020 09:50   MR BRAIN WO CONTRAST  Result Date:  05/23/2020 CLINICAL DATA:  87 year old male code stroke presentation with left MCA territory implicated. Also history of seizures. EXAM: MRI HEAD WITHOUT CONTRAST TECHNIQUE: Multiplanar, multiecho pulse sequences of the brain and surrounding structures were obtained without intravenous contrast. COMPARISON:  CT head and CTA head and neck today. Prior brain MRI 03/25/2018. FINDINGS: Abbreviated exam by design due to code stroke presentation and IV tPA candidate. Brain: Patchy abnormal diffusion at the left superior perirolandic cortex and involving some subcortical white matter (series 2, image 40 and ADC map series 250, image 39. This more resembles acute ischemia than postictal sequelae. Right face representation area seems most affected. There is currently no associated FLAIR hyperintensity. No convincing T2 hyperintensity. No other abnormal diffusion. Patchy and confluent bilateral cerebral white matter T2 and FLAIR hyperintensity appears stable since 2020. Expected evolution of the punctate right cerebellar infarct that year. No midline shift, mass effect, evidence of mass lesion, ventriculomegaly, extra-axial collection or acute intracranial hemorrhage. Cervicomedullary junction and pituitary are within normal limits. No chronic cerebral blood products identified. Vascular: Major intracranial  vascular flow voids are stable since 2020. Skull and upper cervical spine: Chronic severe cervical spine degeneration also demonstrated by CTA today. Bulky chronic ligamentous hypertrophy about the odontoid contributing to multifactorial cervicomedullary junction and C1 level spinal stenosis (series 4, image 13 today). Hardware susceptibility artifact in the cervical spine. Underlying bone marrow signal appears normal. Sinuses/Orbits: Stable, negative. Other: Mastoids remain clear. Visible internal auditory structures appear normal. Postoperative changes to the suboccipital soft tissues. IMPRESSION: 1. Patchy abnormal diffusion at the left perirolandic cortex most resembling acute ischemia. No T2 FLAIR changes or hemorrhage, mass effect at this time. This was discussed in person with Dr. Roland Rack at [phone] hours. 2. Otherwise stable chronic small vessel disease since the 2020 MRI. 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 Electronically Signed   By: Genevie Ann M.D.   On: 05/23/2020 09:58   DG Chest Port 1 View  Result Date: 05/23/2020 CLINICAL DATA:  Cough.  History of aspiration pneumonia. EXAM: PORTABLE CHEST 1 VIEW COMPARISON:  Radiograph 01/07/2020 FINDINGS: Patient is rotated and leaning to the right. Lung volumes are low. Cardiomegaly with aortic atherosclerosis. Mild left basilar scarring, similar to prior. No acute airspace disease. There may be trace pleural effusions. No pulmonary edema. Surgical hardware in the cervical spine is partially included. IMPRESSION: 1. Low lung volumes with left basilar scarring. No new airspace disease. 2. Cardiomegaly with aortic atherosclerosis. 3. Possible trace pleural effusions. Electronically Signed   By: Keith Rake M.D.   On: 05/23/2020 20:16   EEG adult  Result Date: 05/24/2020 Lora Havens, MD     05/24/2020  8:47 AM Patient Name: [name] MRN: [id number] Epilepsy  Attending: Lora Havens Referring Physician/Provider: Dr. Roland Rack Date: 05/23/2020 Duration: 23.19 mins Patient history: 87 yo male who presented as a code stroke due to aphasia, right sided weakness, left gaze preference.  EEG to evaluate for seizure. Level of alertness: Awake, asleep AEDs during EEG study: VPA Technical aspects: This EEG study was done with scalp electrodes positioned according to the 10-20 International system of electrode placement. Electrical activity was acquired at a sampling rate of 500Hz  and reviewed with a high frequency filter of 70Hz  and a low frequency filter of 1Hz . EEG data were recorded continuously and digitally stored. Description: The posterior dominant rhythm consists of 7.5 Hz activity of moderate voltage (25-35 uV) seen predominantly in  posterior head regions, symmetric and reactive to eye opening and eye closing. Sleep was characterized by sleep spindles (12 to 14 Hz), maximal frontocentral region.  EEG also showed continuous generalized 3 to 7 Hz theta-delta slowing.  Hyperventilation and photic stimulation were not performed.   ABNORMALITY - Continuous slow, generalized IMPRESSION: This study is suggestive of mild to moderate diffuse encephalopathy, nonspecific etiology.  No seizures or epileptiform discharges were seen throughout the recording. Lora Havens   ECHOCARDIOGRAM COMPLETE  Result Date: 05/23/2020    ECHOCARDIOGRAM REPORT   Patient Name:   [name] Date of Exam: 05/23/2020 Medical Rec #:  [id number]      Height:       69.0 in Accession #:    [id number]     Weight:       154.5 lb Date of Birth:  [birth year]      BSA:          1.852 m Patient Age:    45 years       BP:           137/74 mmHg Patient Gender: M              HR:           75 bpm. Exam Location:  Inpatient Procedure: 2D Echo, Cardiac Doppler, Color Doppler and Intracardiac            Opacification Agent Indications:    Stroke  History:        Patient has prior history of  Echocardiogram examinations, most                 recent 03/25/2018. TIA, Arrythmias:Atrial Fibrillation; Risk                 Factors:Former Smoker and Hypertension. GERD.  Sonographer:    Clayton Lefort RDCS (AE) Referring Phys: [phone] MCNEILL P [facility]  Sonographer Comments: Technically difficult study due to poor echo windows and suboptimal subcostal window. IMPRESSIONS  1. Left ventricular ejection fraction, by estimation, is 55 to 60%. The left ventricle has normal function. The left ventricle has no regional wall motion abnormalities. There is severe concentric left ventricular hypertrophy. Left ventricular diastolic  function could not be evaluated.  2. Right ventricular systolic function is normal. The right ventricular size is normal.  3. Left atrial size was moderately dilated.  4. Right atrial size was mildly dilated.  5. The mitral valve is grossly normal. Mild mitral valve regurgitation.  6. The aortic valve is grossly normal. Aortic valve regurgitation is trivial. FINDINGS  Left Ventricle: Left ventricular ejection fraction, by estimation, is 55 to 60%. The left ventricle has normal function. The left ventricle has no regional wall motion abnormalities. Definity contrast agent was given IV to delineate the left ventricular  endocardial borders. The left ventricular internal cavity size was small. There is severe concentric left ventricular hypertrophy. Left ventricular diastolic function could not be evaluated due to atrial fibrillation. Left ventricular diastolic function  could not be evaluated. Right Ventricle: The right ventricular size is normal. No increase in right ventricular wall thickness. Right ventricular systolic function is normal. Left Atrium: Left atrial size was moderately dilated. Right Atrium: Right atrial size was mildly dilated. Pericardium: There is no evidence of pericardial effusion. Mitral Valve: The mitral valve is grossly normal. Mild mitral valve regurgitation. Tricuspid  Valve: The tricuspid valve is grossly normal. Tricuspid valve regurgitation is mild. Aortic Valve: The aortic valve is grossly normal. Aortic  valve regurgitation is trivial. Aortic valve mean gradient measures 2.0 mmHg. Aortic valve peak gradient measures 3.5 mmHg. Aortic valve area, by VTI measures 2.87 cm. Pulmonic Valve: The pulmonic valve was grossly normal. Pulmonic valve regurgitation is trivial. Aorta: The aortic root and ascending aorta are structurally normal, with no evidence of dilitation. IAS/Shunts: The atrial septum is grossly normal.  LEFT VENTRICLE PLAX 2D LVIDd:         3.00 cm LVIDs:         2.20 cm LV PW:         1.90 cm LV IVS:        1.90 cm LVOT diam:     1.90 cm LV SV:         45 LV SV Index:   24 LVOT Area:     2.84 cm  RIGHT VENTRICLE RV Basal diam:  2.80 cm RV S prime:     11.20 cm/s TAPSE (M-mode): 1.4 cm LEFT ATRIUM           Index       RIGHT ATRIUM           Index LA diam:      4.00 cm 2.16 cm/m  RA Area:     20.70 cm LA Vol (A2C): 52.4 ml 28.30 ml/m RA Volume:   48.50 ml  26.19 ml/m LA Vol (A4C): 81.7 ml 44.12 ml/m  AORTIC VALVE AV Area (Vmax):    2.57 cm AV Area (Vmean):   2.43 cm AV Area (VTI):     2.87 cm AV Vmax:           94.20 cm/s AV Vmean:          63.733 cm/s AV VTI:            0.156 m AV Peak Grad:      3.5 mmHg AV Mean Grad:      2.0 mmHg LVOT Vmax:         85.30 cm/s LVOT Vmean:        54.600 cm/s LVOT VTI:          0.158 m LVOT/AV VTI ratio: 1.01  AORTA Ao Root diam: 3.90 cm Ao Asc diam:  3.30 cm  SHUNTS Systemic VTI:  0.16 m Systemic Diam: 1.90 cm Mertie Moores MD Electronically signed by Mertie Moores MD Signature Date/Time: 05/23/2020/4:44:58 PM    Final    CT HEAD CODE STROKE WO CONTRAST  Result Date: 05/23/2020 CLINICAL DATA:  Code stroke.  87 year old male. EXAM: CT HEAD WITHOUT CONTRAST TECHNIQUE: Contiguous axial images were obtained from the base of the skull through the vertex without intravenous contrast. COMPARISON:  Brain MRI 03/25/2018.  Head CT  03/24/2018. FINDINGS: Brain: No acute intracranial hemorrhage identified. No midline shift, mass effect, or evidence of intracranial mass lesion. Stable ventricle size and configuration. Patchy and confluent bilateral white matter hypodensity does not appear significantly changed since 2020. No cortically based acute infarct identified. No cortical encephalomalacia identified. Vascular: Extensive Calcified atherosclerosis at the skull base. No suspicious intracranial vascular hyperdensity. Skull: No acute osseous abnormality identified. Sinuses/Orbits: Visualized paranasal sinuses and mastoids are clear. Other: No acute orbit or scalp soft tissues. ASPECTS [facility] Stroke Program Early CT Score) Total score (0-10 with 10 being normal): 10 IMPRESSION: 1. No acute cortically based infarct or acute intracranial hemorrhage identified. ASPECTS 10. 2. Chronic white matter disease appears stable since 2020. 3. These results were communicated to Dr. Leonel Ramsay at 9:17 am on 05/23/2020 by text  page via the [facility] messaging system. Electronically Signed   By: Genevie Ann M.D.   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 1. Does the need for close, 24 hr/day medical supervision in concert with the patient's rehab needs make it unreasonable for this patient to be served in a less intensive setting? Yes 2. Co-Morbidities requiring supervision/potential complications: Cervical spinal stenosis, history of right BKA 3. Due to bladder management, bowel management, safety, skin/wound care, disease management, medication administration, pain management and patient education, does the patient require 24 hr/day rehab nursing? Yes 4. Does the patient require coordinated care of a physician, rehab nurse, therapy disciplines of PT, OT, speech to address physical and functional deficits in the context of the above medical  diagnosis(es)? Yes Addressing deficits in the following areas: balance, endurance, locomotion, strength, transferring, bowel/bladder control, bathing, dressing, feeding, grooming, toileting, cognition, speech, language, swallowing and psychosocial support 5. Can the patient actively participate in an intensive therapy program of at least 3 hrs of therapy per day at least 5 days per week? Potentially 6. The potential for patient to make measurable gains while on inpatient rehab is fair 7. Anticipated functional outcomes upon discharge from inpatient rehab are min assist  with PT, min assist and Wheelchair level slide board with OT, modified independent and Adequate intake for swallowing with SLP. 8. Estimated rehab length of stay to reach the above functional goals is: 16 to 18 days 9. Anticipated discharge destination: Home 10. Overall Rehab/Functional Prognosis: fair  RECOMMENDATIONS: This patient's condition is appropriate for continued rehabilitative care in the following setting: CIR Patient has agreed to participate in recommended program. Yes Note that insurance prior authorization may be required for reimbursement for recommended care.  Comment: Would need 15/7 schedule   Cathlyn Parsons, PA-C 05/25/2020   "I have personally performed a face to face diagnostic evaluation of this patient.  Additionally, I have reviewed and concur with the physician assistant's documentation above." Charlett Blake M.D. [place] Fellow Am [employer] of [employer] and Rehab Diplomate Am Board of Electrodiagnostic Med Fellow Am Board of Interventional Pain

## 2020-05-25 NOTE — Progress Notes (Signed)
ANTICOAGULATION CONSULT NOTE - Initial Consult  Pharmacy Consult:  Eliquis Indication: atrial fibrillation  Allergies  Allergen Reactions  . Eliquis [Apixaban] Other (See Comments)    bleeding  . Demerol [Meperidine] Nausea And Vomiting  . Keppra [Levetiracetam] Other (See Comments)    Causes sleepiness, mental status changes  . Vimpat [Lacosamide] Other (See Comments)    Over sedated     Patient Measurements: Height: 5' 9.02" (175.3 cm) Weight: 70.1 kg (154 lb 8.7 oz) IBW/kg (Calculated) : 70.74  Vital Signs: Temp: 98.3 F (36.8 C) (03/16 1200) Temp Source: Oral (03/16 1200) BP: 165/93 (03/16 1300) Pulse Rate: 91 (03/16 1300)  Labs: Recent Labs    05/23/20 0847 05/23/20 0852  HGB 10.5* 10.2*  HCT 33.5* 30.0*  PLT 123*  --   APTT 29  --   LABPROT 14.4  --   INR 1.2  --   CREATININE 1.39* 1.40*    Estimated Creatinine Clearance: 38.9 mL/min (A) (by C-G formula based on SCr of 1.4 mg/dL (H)).   Medical History: Past Medical History:  Diagnosis Date  . Abnormality of gait 09/22/2015  . Arthritis   . Atrial fibrillation ([place])   . CAD (coronary artery disease)    Stent to RCA, Penta stent, 99% reduced to 0% 2002.  . Cancer ([place])    skin CA removed from back  . 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 Chronic insomnia 03/30/2015  . Chronic kidney disease, stage 3 ([place])   . Complication of anesthesia    trouble waking up  . GERD (gastroesophageal reflux disease)   . Hypercholesteremia   . Hypertension   . Hypothyroidism   . Iron deficiency anemia   . Memory difficulty 09/22/2015  . Osteoarthritis   . PAD (peripheral artery disease) ([place])   . Pneumonia   . Seizures ([place])   . Sepsis ([place]) 05/2017  . Transient alteration of awareness 03/30/2015  . Vertigo    hx of  . Vitamin D deficiency      Assessment: 38 YOM presented as a code stroke and received tPA on 05/23/20 AM.  He has a history of Afib and self-discontinued Eliquis due to  leg hematoma.  He also had a GIB in 2021.  Pharmacy consulted to resume Eliquis.  Patient qualifies for full dosing since weight > 60kg and SCr < 1.5.  No bleeding reported.  Goal of Therapy:  Appropriate anticoagulation Monitor platelets by anticoagulation protocol: Yes   Plan:  Eliquis 5mg  PO BID Pharmacy will sign off and follow peripherally.  Thank you for the consult! Monitor closely for s/sx of bleeding giving bleeding history BMET and CBC in AM  Adelynne Joerger D. Mina Marble, PharmD, BCPS, [place] 05/25/2020, 3:15 PM

## 2020-05-26 ENCOUNTER — Inpatient Hospital Stay (HOSPITAL_COMMUNITY)
Admission: RE | Admit: 2020-05-26 | Discharge: 2020-06-04 | DRG: 056 | Disposition: A | Discharge status: Other Acute Inpatient Hospital | Payer: Medicare Other | Source: Intra-hospital | Attending: Physical Medicine and Rehabilitation | Admitting: Physical Medicine and Rehabilitation

## 2020-05-26 ENCOUNTER — Telehealth: Payer: Self-pay | Admitting: Physician Assistant

## 2020-05-26 ENCOUNTER — Other Ambulatory Visit: Payer: Self-pay

## 2020-05-26 ENCOUNTER — Encounter (HOSPITAL_COMMUNITY): Payer: Self-pay | Admitting: Physical Medicine and Rehabilitation

## 2020-05-26 DIAGNOSIS — I482 Chronic atrial fibrillation, unspecified: Secondary | ICD-10-CM | POA: Diagnosis present

## 2020-05-26 DIAGNOSIS — I4891 Unspecified atrial fibrillation: Secondary | ICD-10-CM | POA: Diagnosis present

## 2020-05-26 DIAGNOSIS — E7800 Pure hypercholesterolemia, unspecified: Secondary | ICD-10-CM | POA: Diagnosis present

## 2020-05-26 DIAGNOSIS — K219 Gastro-esophageal reflux disease without esophagitis: Secondary | ICD-10-CM | POA: Diagnosis present

## 2020-05-26 DIAGNOSIS — Z87891 Personal history of nicotine dependence: Secondary | ICD-10-CM

## 2020-05-26 DIAGNOSIS — J690 Pneumonitis due to inhalation of food and vomit: Secondary | ICD-10-CM | POA: Diagnosis not present

## 2020-05-26 DIAGNOSIS — Z8249 Family history of ischemic heart disease and other diseases of the circulatory system: Secondary | ICD-10-CM | POA: Diagnosis not present

## 2020-05-26 DIAGNOSIS — N183 Chronic kidney disease, stage 3 unspecified: Secondary | ICD-10-CM | POA: Diagnosis present

## 2020-05-26 DIAGNOSIS — R1310 Dysphagia, unspecified: Secondary | ICD-10-CM | POA: Diagnosis not present

## 2020-05-26 DIAGNOSIS — I5032 Chronic diastolic (congestive) heart failure: Secondary | ICD-10-CM | POA: Diagnosis present

## 2020-05-26 DIAGNOSIS — I639 Cerebral infarction, unspecified: Secondary | ICD-10-CM | POA: Diagnosis present

## 2020-05-26 DIAGNOSIS — Z89511 Acquired absence of right leg below knee: Secondary | ICD-10-CM

## 2020-05-26 DIAGNOSIS — M545 Low back pain, unspecified: Secondary | ICD-10-CM | POA: Diagnosis present

## 2020-05-26 DIAGNOSIS — E785 Hyperlipidemia, unspecified: Secondary | ICD-10-CM | POA: Diagnosis present

## 2020-05-26 DIAGNOSIS — Z66 Do not resuscitate: Secondary | ICD-10-CM | POA: Diagnosis not present

## 2020-05-26 DIAGNOSIS — G8191 Hemiplegia, unspecified affecting right dominant side: Secondary | ICD-10-CM

## 2020-05-26 DIAGNOSIS — I10 Essential (primary) hypertension: Secondary | ICD-10-CM | POA: Diagnosis not present

## 2020-05-26 DIAGNOSIS — Z955 Presence of coronary angioplasty implant and graft: Secondary | ICD-10-CM

## 2020-05-26 DIAGNOSIS — R54 Age-related physical debility: Secondary | ICD-10-CM | POA: Diagnosis present

## 2020-05-26 DIAGNOSIS — R5383 Other fatigue: Secondary | ICD-10-CM | POA: Diagnosis not present

## 2020-05-26 DIAGNOSIS — G40909 Epilepsy, unspecified, not intractable, without status epilepticus: Secondary | ICD-10-CM | POA: Diagnosis present

## 2020-05-26 DIAGNOSIS — Z7189 Other specified counseling: Secondary | ICD-10-CM | POA: Diagnosis not present

## 2020-05-26 DIAGNOSIS — I69391 Dysphagia following cerebral infarction: Secondary | ICD-10-CM | POA: Diagnosis not present

## 2020-05-26 DIAGNOSIS — G8929 Other chronic pain: Secondary | ICD-10-CM | POA: Diagnosis present

## 2020-05-26 DIAGNOSIS — G8918 Other acute postprocedural pain: Secondary | ICD-10-CM

## 2020-05-26 DIAGNOSIS — Z515 Encounter for palliative care: Secondary | ICD-10-CM | POA: Diagnosis not present

## 2020-05-26 DIAGNOSIS — Z79890 Hormone replacement therapy (postmenopausal): Secondary | ICD-10-CM

## 2020-05-26 DIAGNOSIS — I2510 Atherosclerotic heart disease of native coronary artery without angina pectoris: Secondary | ICD-10-CM | POA: Diagnosis present

## 2020-05-26 DIAGNOSIS — Z7902 Long term (current) use of antithrombotics/antiplatelets: Secondary | ICD-10-CM

## 2020-05-26 DIAGNOSIS — R1312 Dysphagia, oropharyngeal phase: Secondary | ICD-10-CM | POA: Diagnosis not present

## 2020-05-26 DIAGNOSIS — Z809 Family history of malignant neoplasm, unspecified: Secondary | ICD-10-CM | POA: Diagnosis not present

## 2020-05-26 DIAGNOSIS — Z888 Allergy status to other drugs, medicaments and biological substances status: Secondary | ICD-10-CM

## 2020-05-26 DIAGNOSIS — M069 Rheumatoid arthritis, unspecified: Secondary | ICD-10-CM | POA: Diagnosis present

## 2020-05-26 DIAGNOSIS — I130 Hypertensive heart and chronic kidney disease with heart failure and stage 1 through stage 4 chronic kidney disease, or unspecified chronic kidney disease: Secondary | ICD-10-CM | POA: Diagnosis present

## 2020-05-26 DIAGNOSIS — E039 Hypothyroidism, unspecified: Secondary | ICD-10-CM | POA: Diagnosis present

## 2020-05-26 DIAGNOSIS — Z841 Family history of disorders of kidney and ureter: Secondary | ICD-10-CM

## 2020-05-26 DIAGNOSIS — D638 Anemia in other chronic diseases classified elsewhere: Secondary | ICD-10-CM | POA: Diagnosis not present

## 2020-05-26 DIAGNOSIS — R059 Cough, unspecified: Secondary | ICD-10-CM

## 2020-05-26 DIAGNOSIS — I69320 Aphasia following cerebral infarction: Secondary | ICD-10-CM

## 2020-05-26 DIAGNOSIS — I69322 Dysarthria following cerebral infarction: Secondary | ICD-10-CM | POA: Diagnosis present

## 2020-05-26 DIAGNOSIS — Z981 Arthrodesis status: Secondary | ICD-10-CM

## 2020-05-26 DIAGNOSIS — E1151 Type 2 diabetes mellitus with diabetic peripheral angiopathy without gangrene: Secondary | ICD-10-CM | POA: Diagnosis present

## 2020-05-26 DIAGNOSIS — I6300 Cerebral infarction due to thrombosis of unspecified precerebral artery: Secondary | ICD-10-CM

## 2020-05-26 DIAGNOSIS — Z7983 Long term (current) use of bisphosphonates: Secondary | ICD-10-CM

## 2020-05-26 DIAGNOSIS — E559 Vitamin D deficiency, unspecified: Secondary | ICD-10-CM | POA: Diagnosis present

## 2020-05-26 DIAGNOSIS — Z7952 Long term (current) use of systemic steroids: Secondary | ICD-10-CM

## 2020-05-26 DIAGNOSIS — R627 Adult failure to thrive: Secondary | ICD-10-CM | POA: Diagnosis present

## 2020-05-26 DIAGNOSIS — I739 Peripheral vascular disease, unspecified: Secondary | ICD-10-CM | POA: Diagnosis present

## 2020-05-26 DIAGNOSIS — Z79899 Other long term (current) drug therapy: Secondary | ICD-10-CM

## 2020-05-26 LAB — CBC
HCT: 30.3 % — ABNORMAL LOW (ref 39.0–52.0)
Hemoglobin: 9.7 g/dL — ABNORMAL LOW (ref 13.0–17.0)
MCH: 33.1 pg (ref 26.0–34.0)
MCHC: 32 g/dL (ref 30.0–36.0)
MCV: 103.4 fL — ABNORMAL HIGH (ref 80.0–100.0)
Platelets: 127 10*3/uL — ABNORMAL LOW (ref 150–400)
RBC: 2.93 MIL/uL — ABNORMAL LOW (ref 4.22–5.81)
RDW: 17.1 % — ABNORMAL HIGH (ref 11.5–15.5)
WBC: 6.8 10*3/uL (ref 4.0–10.5)
nRBC: 0 % (ref 0.0–0.2)

## 2020-05-26 LAB — BASIC METABOLIC PANEL
Anion gap: 11 (ref 5–15)
BUN: 20 mg/dL (ref 8–23)
CO2: 21 mmol/L — ABNORMAL LOW (ref 22–32)
Calcium: 8.1 mg/dL — ABNORMAL LOW (ref 8.9–10.3)
Chloride: 110 mmol/L (ref 98–111)
Creatinine, Ser: 1.15 mg/dL (ref 0.61–1.24)
GFR, Estimated: >60 mL/min (ref >60)
Glucose, Bld: 77 mg/dL (ref 70–99)
Potassium: 3.7 mmol/L (ref 3.5–5.1)
Sodium: 142 mmol/L (ref 135–145)

## 2020-05-26 MED ORDER — ATORVASTATIN CALCIUM 80 MG PO TABS
80.0000 mg | ORAL_TABLET | Freq: Every day | ORAL | Status: DC
  Administered 2020-05-27 – 2020-05-29 (×3): 80 mg via ORAL
  Filled 2020-05-26 (×3): qty 1

## 2020-05-26 MED ORDER — ACETAMINOPHEN 325 MG PO TABS: 650.0000 mg | ORAL_TABLET | ORAL | Status: DC | PRN

## 2020-05-26 MED ORDER — RESOURCE THICKENUP CLEAR PO POWD
ORAL | Status: DC | PRN
  Filled 2020-05-26: qty 125

## 2020-05-26 MED ORDER — DILTIAZEM HCL ER COATED BEADS 120 MG PO CP24
240.0000 mg | ORAL_CAPSULE | Freq: Every day | ORAL | Status: DC
  Administered 2020-05-27 – 2020-05-28 (×2): 240 mg via ORAL
  Filled 2020-05-26 (×4): qty 2

## 2020-05-26 MED ORDER — HYDROCODONE-ACETAMINOPHEN 10-325 MG PO TABS
1.0000 | ORAL_TABLET | ORAL | Status: DC | PRN
  Administered 2020-05-26 – 2020-05-31 (×6): 1 via ORAL
  Filled 2020-05-26 (×6): qty 1

## 2020-05-26 MED ORDER — DIVALPROEX SODIUM ER 500 MG PO TB24
500.0000 mg | ORAL_TABLET | Freq: Two times a day (BID) | ORAL | Status: DC
  Administered 2020-05-26 – 2020-05-27 (×2): 500 mg via ORAL
  Filled 2020-05-26 (×5): qty 1

## 2020-05-26 MED ORDER — BLOOD PRESSURE CONTROL BOOK
Freq: Once | Status: AC
  Filled 2020-05-26: qty 1

## 2020-05-26 MED ORDER — DABIGATRAN ETEXILATE MESYLATE 150 MG PO CAPS
150.0000 mg | ORAL_CAPSULE | Freq: Two times a day (BID) | ORAL | Status: DC
  Administered 2020-05-26 – 2020-05-31 (×6): 150 mg via ORAL
  Filled 2020-05-26 (×13): qty 1

## 2020-05-26 MED ORDER — ACETAMINOPHEN 160 MG/5ML PO SOLN: 650.0000 mg | ORAL | 0 refills | Status: DC | PRN

## 2020-05-26 MED ORDER — RESOURCE THICKENUP CLEAR PO POWD: 1.0000 | ORAL | Status: DC | PRN

## 2020-05-26 MED ORDER — PREDNISONE 1 MG PO TABS
4.0000 mg | ORAL_TABLET | Freq: Every day | ORAL | Status: DC
  Administered 2020-05-27 – 2020-05-29 (×3): 4 mg via ORAL
  Filled 2020-05-26 (×6): qty 4

## 2020-05-26 MED ORDER — ACETAMINOPHEN 650 MG RE SUPP: 650.0000 mg | RECTAL | Status: DC | PRN

## 2020-05-26 MED ORDER — OXYMETAZOLINE HCL 0.05 % NA SOLN
2.0000 | Freq: Two times a day (BID) | NASAL | Status: DC | PRN
  Administered 2020-05-26: 2 via NASAL
  Filled 2020-05-26: qty 30

## 2020-05-26 MED ORDER — FUROSEMIDE 20 MG PO TABS
20.0000 mg | ORAL_TABLET | Freq: Every day | ORAL | Status: DC
  Administered 2020-05-27 – 2020-05-29 (×3): 20 mg via ORAL
  Filled 2020-05-26 (×3): qty 1

## 2020-05-26 MED ORDER — METOPROLOL TARTRATE 25 MG PO TABS
25.0000 mg | ORAL_TABLET | Freq: Two times a day (BID) | ORAL | Status: DC
  Administered 2020-05-26 – 2020-06-01 (×8): 25 mg via ORAL
  Filled 2020-05-26 (×9): qty 1

## 2020-05-26 MED ORDER — RESOURCE THICKENUP CLEAR PO POWD: ORAL | Status: DC | PRN

## 2020-05-26 MED ORDER — DIVALPROEX SODIUM ER 500 MG PO TB24: 500.0000 mg | ORAL_TABLET | Freq: Two times a day (BID) | ORAL | Status: DC

## 2020-05-26 MED ORDER — DOXAZOSIN MESYLATE 4 MG PO TABS
4.0000 mg | ORAL_TABLET | Freq: Every day | ORAL | Status: DC
  Administered 2020-05-26 – 2020-06-01 (×5): 4 mg via ORAL
  Filled 2020-05-26: qty 2
  Filled 2020-05-26 (×6): qty 1
  Filled 2020-05-26: qty 2
  Filled 2020-05-26: qty 1

## 2020-05-26 MED ORDER — DABIGATRAN ETEXILATE MESYLATE 150 MG PO CAPS: 150.0000 mg | ORAL_CAPSULE | Freq: Two times a day (BID) | ORAL | Status: DC

## 2020-05-26 MED ORDER — ACETAMINOPHEN 160 MG/5ML PO SOLN: 650.0000 mg | ORAL | Status: DC | PRN

## 2020-05-26 MED ORDER — ACETAMINOPHEN 325 MG PO TABS: 650.0000 mg | ORAL_TABLET | ORAL | Status: AC | PRN

## 2020-05-26 MED ORDER — LEVOTHYROXINE SODIUM 75 MCG PO TABS
150.0000 ug | ORAL_TABLET | Freq: Every day | ORAL | Status: DC
  Administered 2020-05-27 – 2020-06-01 (×4): 150 ug via ORAL
  Filled 2020-05-26 (×5): qty 2

## 2020-05-26 MED ORDER — ACETAMINOPHEN 650 MG RE SUPP: 650.0000 mg | RECTAL | 0 refills | Status: AC | PRN

## 2020-05-26 MED ORDER — ATORVASTATIN CALCIUM 80 MG PO TABS: 80.0000 mg | ORAL_TABLET | Freq: Every day | ORAL | Status: DC

## 2020-05-26 MED ORDER — PANTOPRAZOLE SODIUM 40 MG PO TBEC
40.0000 mg | DELAYED_RELEASE_TABLET | Freq: Every day | ORAL | Status: DC
  Administered 2020-05-26 – 2020-06-01 (×5): 40 mg via ORAL
  Filled 2020-05-26 (×5): qty 1

## 2020-05-26 NOTE — Progress Notes (Signed)
Received a call from Mr. Gluth nurse Zenia Resides, stating Mr. Sarris requesting  Inhaler. His vitals are stable with no distress noted, Dr Dagoberto Ligas note was reviewed. This provider called inpatient pharmacy, Mr. Brumett wasn't on a inhaler on the acute side, this was discussed with Zenia Resides, he verbalizes understanding. We will continue to monitor.

## 2020-05-26 NOTE — Discharge Summary (Addendum)
Stroke Discharge Summary  Patient ID: [name]   MRN: [id number]      DOB: September 16, 1933  Date of Admission: 05/23/2020 Date of Discharge: 05/26/2020  Attending Physician:  Dr. Antony Contras, Stroke MD Consultant(s):    Patient's PCP:  [name], MD  Discharge Diagnoses:  1. 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 from atrial fibrillation while off anticoagulation Active Problems:   Stroke (cerebrum) (HCC)   Medications to be continued on Rehab Allergies as of 05/26/2020       Reactions   Eliquis [apixaban] Other (See Comments)   bleeding   Demerol [meperidine] Nausea And Vomiting   Keppra [levetiracetam] Other (See Comments)   Causes sleepiness, mental status changes   Vimpat [lacosamide] Other (See Comments)   Over sedated         Medication List     STOP taking these medications    aspirin EC 81 MG tablet   mupirocin ointment 2 % Commonly known as: BACTROBAN   pentoxifylline 400 MG CR tablet Commonly known as: TRENTAL   pravastatin 40 MG tablet Commonly known as: PRAVACHOL Replaced by: atorvastatin 80 MG tablet   sulfamethoxazole-trimethoprim 800-160 MG tablet Commonly known as: Bactrim DS       TAKE these medications    acetaminophen 325 MG tablet Commonly known as: TYLENOL Take 2 tablets (650 mg total) by mouth every 4 (four) hours as needed for mild pain (or temp > 37.5 C (99.5 F)).   acetaminophen 160 MG/5ML solution Commonly known as: TYLENOL Place 20.3 mLs (650 mg total) into feeding tube every 4 (four) hours as needed for mild pain (or temp > 37.5 C (99.5 F)).   acetaminophen 650 MG suppository Commonly known as: TYLENOL Place 1 suppository (650 mg total) rectally every 4 (four) hours as needed for mild pain (or temp > 37.5 C (99.5 F)).   alendronate 70 MG tablet Commonly known as: FOSAMAX Take 70 mg by mouth every Wednesday. Remain upright for 30-60 minutes.   atorvastatin 80 MG tablet Commonly  known as: LIPITOR Take 1 tablet (80 mg total) by mouth daily. Replaces: pravastatin 40 MG tablet   Calcium 600+D 600-800 MG-UNIT Tabs Generic drug: Calcium Carb-Cholecalciferol Take 1 tablet by mouth daily.   CertaVite/Antioxidants Tabs Take 1 tablet by mouth every evening.   dabigatran 150 MG Caps capsule Commonly known as: PRADAXA Take 1 capsule (150 mg total) by mouth every 12 (twelve) hours.   diltiazem 240 MG 24 hr capsule Commonly known as: CARDIZEM CD TAKE 1 CAPSULE BY MOUTH EVERY MORNING. HOLD IF SYSTOLIC BLOOD PRESSURE IS LESS THAN 100 What changed: See the new instructions.   divalproex 500 MG 24 hr tablet Commonly known as: DEPAKOTE ER Take 1 tablet (500 mg total) by mouth 2 (two) times daily. What changed:  medication strength how much to take when to take this   docusate sodium 100 MG capsule Commonly known as: COLACE Take 100 mg by mouth daily as needed for mild constipation.   doxazosin 4 MG tablet Commonly known as: CARDURA TAKE 1 TABLET(4 MG) BY MOUTH AT BEDTIME What changed: See the new instructions.   furosemide 20 MG tablet Commonly known as: LASIX Take 1 tablet (20 mg total) by mouth daily.   HYDROcodone-acetaminophen 10-325 MG tablet Commonly known as: NORCO Take 1 tablet by mouth every 4 (four) hours as needed for pain.   levothyroxine 150 MCG tablet Commonly known as: SYNTHROID Take 150 mcg by mouth  daily before breakfast.   lidocaine 5 % Commonly known as: LIDODERM Place 1-5 patches onto the skin daily. Remove & Discard patch within 12 hours or as directed by MD (lower back and shoulders)   metoprolol tartrate 25 MG tablet Commonly known as: LOPRESSOR TAKE 1 TABLET BY MOUTH TWICE DAILY WITH A MEAL OR IMMEDIATELY AFTER What changed:  how much to take how to take this when to take this additional instructions   nitrofurantoin 100 MG capsule Commonly known as: MACRODANTIN Take 100 mg by mouth daily.   nitroGLYCERIN 0.4 MG SL  tablet Commonly known as: NITROSTAT Place 1 tablet (0.4 mg total) under the tongue every 5 (five) minutes as needed for chest pain (call 911 afer 3 doses and if chest pain persists).   pantoprazole 40 MG tablet Commonly known as: PROTONIX Take 1 tablet (40 mg total) by mouth 2 (two) times daily.   polyethylene glycol 17 g packet Commonly known as: MIRALAX / GLYCOLAX Take 17 g by mouth at bedtime.   potassium chloride SA 20 MEQ tablet Commonly known as: KLOR-CON Take 0.5 tablets (10 mEq total) by mouth daily.   predniSONE 1 MG tablet Commonly known as: DELTASONE Take 4 mg by mouth daily with breakfast.   predniSONE 5 MG tablet Commonly known as: DELTASONE Take 5 mg by mouth daily.   Resource ThickenUp Clear Powd Take 1 Container by mouth as needed.        LABORATORY STUDIES CBC    Component Value Date/Time   WBC 6.8 05/26/2020 0432   RBC 2.93 (L) 05/26/2020 0432   HGB 9.7 (L) 05/26/2020 0432   HGB 11.0 (L) 10/16/2019 1220   HCT 30.3 (L) 05/26/2020 0432   HCT 33.0 (L) 10/16/2019 1220   PLT 127 (L) 05/26/2020 0432   PLT 95 (LL) 10/16/2019 1220   MCV 103.4 (H) 05/26/2020 0432   MCV 101 (H) 10/16/2019 1220   MCH 33.1 05/26/2020 0432   MCHC 32.0 05/26/2020 0432   RDW 17.1 (H) 05/26/2020 0432   RDW 14.8 10/16/2019 1220   LYMPHSABS 1.1 05/23/2020 0847   LYMPHSABS 1.3 07/20/2015 0914   MONOABS 0.6 05/23/2020 0847   EOSABS 0.1 05/23/2020 0847   EOSABS 0.2 07/20/2015 0914   BASOSABS 0.0 05/23/2020 0847   BASOSABS 0.1 07/20/2015 0914   CMP    Component Value Date/Time   NA 142 05/26/2020 0432   NA 141 10/16/2019 1220   K 3.7 05/26/2020 0432   CL 110 05/26/2020 0432   CO2 21 (L) 05/26/2020 0432   GLUCOSE 77 05/26/2020 0432   BUN 20 05/26/2020 0432   BUN 33 (H) 10/16/2019 1220   CREATININE 1.15 05/26/2020 0432   CALCIUM 8.1 (L) 05/26/2020 0432   PROT 5.5 (L) 05/23/2020 0847   PROT 4.9 (L) 10/16/2019 1220   ALBUMIN 3.5 05/23/2020 0847   ALBUMIN 3.6 10/16/2019  1220   AST 27 05/23/2020 0847   ALT 17 05/23/2020 0847   ALKPHOS 57 05/23/2020 0847   BILITOT 0.6 05/23/2020 0847   BILITOT 0.7 10/16/2019 1220   GFRNONAA >60 05/26/2020 0432   GFRAA 57 (L) 10/16/2019 1220   COAGS Lab Results  Component Value Date   INR 1.2 05/23/2020   INR 1.2 01/07/2020   INR 1.16 03/24/2018   Lipid Panel    Component Value Date/Time   CHOL 124 05/24/2020 0715   CHOL 121 10/16/2019 1220   TRIG 84 05/24/2020 0715   HDL 56 05/24/2020 0715   HDL 58 10/16/2019 1220  CHOLHDL 2.2 05/24/2020 0715   VLDL 17 05/24/2020 0715   LDLCALC 51 05/24/2020 0715   LDLCALC 44 10/16/2019 1220   HgbA1C  Lab Results  Component Value Date   HGBA1C 5.1 05/24/2020   Urinalysis    Component Value Date/Time   COLORURINE YELLOW 07/10/2019 0630   APPEARANCEUR CLEAR 07/10/2019 0630   LABSPEC 1.015 07/10/2019 0630   PHURINE 7.0 07/10/2019 0630   GLUCOSEU NEGATIVE 07/10/2019 0630   HGBUR LARGE (A) 07/10/2019 0630   BILIRUBINUR NEGATIVE 07/10/2019 0630   KETONESUR 5 (A) 07/10/2019 0630   PROTEINUR 30 (A) 07/10/2019 0630   UROBILINOGEN 1.0 09/16/2007 1212   NITRITE NEGATIVE 07/10/2019 0630   LEUKOCYTESUR SMALL (A) 07/10/2019 0630   Urine Drug Screen     Component Value Date/Time   LABOPIA POSITIVE (A) 07/01/2017 1524   COCAINSCRNUR NONE DETECTED 07/01/2017 1524   LABBENZ NONE DETECTED 07/01/2017 1524   AMPHETMU NONE DETECTED 07/01/2017 1524   THCU NONE DETECTED 07/01/2017 1524   LABBARB NONE DETECTED 07/01/2017 1524    Alcohol Level    Component Value Date/Time   ETH <10 07/01/2017 1612     SIGNIFICANT DIAGNOSTIC STUDIES CT ANGIO HEAD W OR WO CONTRAST  Result Date: 05/23/2020 CLINICAL DATA:  87 year old male code stroke presentation with left MCA territory implicated. Also history of seizures. EXAM: CT ANGIOGRAPHY HEAD AND NECK TECHNIQUE: Multidetector CT imaging of the head and neck was performed using the standard protocol during bolus administration of  intravenous contrast. Multiplanar CT image reconstructions and MIPs were obtained to evaluate the vascular anatomy. Carotid stenosis measurements (when applicable) are obtained utilizing NASCET criteria, using the distal internal carotid diameter as the denominator. CONTRAST:  39mL OMNIPAQUE IOHEXOL 350 MG/ML SOLN COMPARISON:  CTA head and neck 03/24/2018. Plain head CT 0857 hours today. FINDINGS: CTA NECK Skeleton: Chronic severe cervical spine degeneration status post long segment anterior and posterior fusion. Bulky degenerative ligamentous hypertrophy about the odontoid. Evidence of moderate cervicomedullary junction spinal stenosis, also evident by MRI in 2020. No acute osseous abnormality identified. Upper chest: Lower lung volumes with atelectasis. Negative visible superior mediastinum. Other neck: Small volume retained secretions in the pharynx. No other acute neck soft tissue finding. Aortic arch: Stable aortic arch with mild atherosclerosis. Generalized tortuosity of the great vessels. Right carotid system: Tortuous brachiocephalic artery and especially right CCA origin without stenosis. Streak artifact at the right ICA bulb. Relatively mild origin and bulb atherosclerosis with no significant stenosis. Partially retropharyngeal course of the right ICA which remains patent to the skull base. Left carotid system: Tortuous proximal left CCA with a kinked appearance at the thoracic inlet but no stenosis. Mild plaque at the left ICA origin. Moderate calcified plaque at the bulb but no significant stenosis. The left ICA remains patent to the skull base. Vertebral arteries: Tortuosity and calcified plaque at the proximal right subclavian artery without significant stenosis. Chronic bulky calcified plaque and severe stenosis at the right vertebral artery origin. The right vertebral does remain patent to the skull base, is highly tortuous in the V2 segment. Tortuous proximal left subclavian artery with calcified  plaque but no significant stenosis. Calcified plaque at the left vertebral artery origin resulting in mild or at most moderate stenosis. Tortuous left vertebral artery in the V2 segment, remains patent to the skull base. CTA HEAD Posterior circulation: Bilateral V4 segment calcified plaque with moderate to severe V4 segment stenosis greater on the right (series 6, image 297). Both distal V4 segments remain patent to the  basilar. Right PICA origin remains patent. Patent basilar artery with mild irregularity and no significant stenosis. Patent SCA origins. Fetal type bilateral PCA is again noted. Bilateral PCA branches appears stable and mildly irregular. Anterior circulation: Both ICA siphons are patent. Tortuous bilateral siphons with extensive calcified plaque but only mild supraclinoid stenosis on the right. Bilateral ophthalmic and posterior communicating artery origins remain normal. Patent carotid termini, MCA and ACA origins with dominant left and diminutive or absent right ACA A1 segment. Anterior communicating artery and bilateral ACA branches appear stable since 2020. Right MCA M1 segment and bifurcation remain patent. Right MCA branches appear stable since 2020. Left MCA M1 segment and left MCA trifurcation is patent with mild irregularity but no significant stenosis. Left MCA M2 branches appears stable since 2020. And allowing for different contrast timing today, the proximal M3 branches also seem to remain patent. Venous sinuses: Early contrast timing, but grossly patent. Anatomic variants: Fetal type PCA origins, dominant left ACA A1. Review of the MIP images confirms the above findings IMPRESSION: 1. Negative for emergent large vessel occlusion. Left MCA branches appear stable from a 2020 CTA. This was reviewed in person with Dr. Roland Rack at [phone] hours. 2. Positive for chronic arterial tortuosity and calcified plaque in the head and neck. 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 - Moderate Left Vertebral V4 segment. 3. Chronic very severe cervical spine degeneration. 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 Electronically Signed   By: Genevie Ann M.D.   On: 05/23/2020 09:50   CT HEAD WO CONTRAST  Result Date: 05/24/2020 CLINICAL DATA:  Stroke follow-up. EXAM: CT HEAD WITHOUT CONTRAST TECHNIQUE: Contiguous axial images were obtained from the base of the skull through the vertex without intravenous contrast. COMPARISON:  May 23, 2020 FINDINGS: Brain: Findings concerning for left perirolandic acute ischemia were better characterized on prior MRI. No substantial edema or mass effect in this region. No evidence of interval/new acute large vascular territory infarct. No mass occupying hemorrhagic transformation. Similar generalized cerebral volume loss with ex vacuo ventricular dilation and chronic microvascular ischemic disease. No evidence of a mass lesion or abnormal mass effect. Vascular: Calcific atherosclerosis. No hyperdense vessel identified. Skull: No acute fracture. Sinuses/Orbits: Clear sinuses.  Unremarkable orbits. Other: No mastoid effusions. IMPRESSION: Findings concerning for left perirolandic acute ischemia were better characterized on prior MRI. No substantial edema or mass effect in this region. No mass occupying hemorrhagic transformation. Electronically Signed   By: Margaretha Sheffield MD   On: 05/24/2020 11:40   CT ANGIO NECK W OR WO CONTRAST  Result Date: 05/23/2020 CLINICAL DATA:  87 year old male code stroke presentation with left MCA territory implicated. Also history of seizures. EXAM: CT ANGIOGRAPHY HEAD AND NECK TECHNIQUE: Multidetector CT imaging of the head and neck was performed using the standard protocol during bolus administration of intravenous contrast. Multiplanar CT image reconstructions and MIPs were obtained to evaluate the vascular anatomy. Carotid  stenosis measurements (when applicable) are obtained utilizing NASCET criteria, using the distal internal carotid diameter as the denominator. CONTRAST:  1mL OMNIPAQUE IOHEXOL 350 MG/ML SOLN COMPARISON:  CTA head and neck 03/24/2018. Plain head CT 0857 hours today. FINDINGS: CTA NECK Skeleton: Chronic severe cervical spine degeneration status post long segment anterior and posterior fusion. Bulky degenerative ligamentous hypertrophy about the odontoid. Evidence of moderate cervicomedullary junction spinal stenosis, also evident by MRI in 2020. No acute osseous abnormality identified. Upper chest: Lower lung volumes  with atelectasis. Negative visible superior mediastinum. Other neck: Small volume retained secretions in the pharynx. No other acute neck soft tissue finding. Aortic arch: Stable aortic arch with mild atherosclerosis. Generalized tortuosity of the great vessels. Right carotid system: Tortuous brachiocephalic artery and especially right CCA origin without stenosis. Streak artifact at the right ICA bulb. Relatively mild origin and bulb atherosclerosis with no significant stenosis. Partially retropharyngeal course of the right ICA which remains patent to the skull base. Left carotid system: Tortuous proximal left CCA with a kinked appearance at the thoracic inlet but no stenosis. Mild plaque at the left ICA origin. Moderate calcified plaque at the bulb but no significant stenosis. The left ICA remains patent to the skull base. Vertebral arteries: Tortuosity and calcified plaque at the proximal right subclavian artery without significant stenosis. Chronic bulky calcified plaque and severe stenosis at the right vertebral artery origin. The right vertebral does remain patent to the skull base, is highly tortuous in the V2 segment. Tortuous proximal left subclavian artery with calcified plaque but no significant stenosis. Calcified plaque at the left vertebral artery origin resulting in mild or at most moderate  stenosis. Tortuous left vertebral artery in the V2 segment, remains patent to the skull base. CTA HEAD Posterior circulation: Bilateral V4 segment calcified plaque with moderate to severe V4 segment stenosis greater on the right (series 6, image 297). Both distal V4 segments remain patent to the basilar. Right PICA origin remains patent. Patent basilar artery with mild irregularity and no significant stenosis. Patent SCA origins. Fetal type bilateral PCA is again noted. Bilateral PCA branches appears stable and mildly irregular. Anterior circulation: Both ICA siphons are patent. Tortuous bilateral siphons with extensive calcified plaque but only mild supraclinoid stenosis on the right. Bilateral ophthalmic and posterior communicating artery origins remain normal. Patent carotid termini, MCA and ACA origins with dominant left and diminutive or absent right ACA A1 segment. Anterior communicating artery and bilateral ACA branches appear stable since 2020. Right MCA M1 segment and bifurcation remain patent. Right MCA branches appear stable since 2020. Left MCA M1 segment and left MCA trifurcation is patent with mild irregularity but no significant stenosis. Left MCA M2 branches appears stable since 2020. And allowing for different contrast timing today, the proximal M3 branches also seem to remain patent. Venous sinuses: Early contrast timing, but grossly patent. Anatomic variants: Fetal type PCA origins, dominant left ACA A1. Review of the MIP images confirms the above findings IMPRESSION: 1. Negative for emergent large vessel occlusion. Left MCA branches appear stable from a 2020 CTA. This was reviewed in person with Dr. Roland Rack at [phone] hours. 2. Positive for chronic arterial tortuosity and calcified plaque in the head and neck. 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 - Moderate Left Vertebral V4 segment. 3. Chronic very severe  cervical spine degeneration. 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 Electronically Signed   By: Genevie Ann M.D.   On: 05/23/2020 09:50   MR BRAIN WO CONTRAST  Result Date: 05/25/2020 CLINICAL DATA:  Stroke follow-up EXAM: MRI HEAD WITHOUT CONTRAST TECHNIQUE: Multiplanar, multiecho pulse sequences of the brain and surrounding structures were obtained without intravenous contrast. COMPARISON:  CT head 05/24/2020.  MRI head 05/23/2020 FINDINGS: Brain: Restricted diffusion in the left perirolandic cortex compatible with acute infarct. Diffusion-weighted imaging is strongly positive and has progressed since the prior study. The distribution of infarct is unchanged from 2 days ago. No associated hemorrhage.  There is now FLAIR hyperintensity in the cortex in the area of infarction. Moderate to advanced atrophy. Chronic microvascular ischemic changes in the white matter. No hemorrhage or mass lesion. Vascular: Normal arterial flow voids Skull and upper cervical spine: Anterior and posterior cervical spine fusion. Destructive process involving the dens with large amount of surrounding soft tissue. There is moderate spinal stenosis at the craniocervical junction with displacement of the cord and cord deformity. No interval change. Sinuses/Orbits: Paranasal sinuses clear. Bilateral cataract extraction Other: None IMPRESSION: Acute infarct in the left Perirolandic cortex. Diffusion-weighted imaging is more strongly positive on today's study than the recent MRI. There is now FLAIR hyperintensity in the cortex in this area. No associated hemorrhage Extensive atrophy. Chronic microvascular ischemic change in the white matter Destructive process of the dens with extensive surrounding soft tissue thickening posterior dens causing moderate spinal stenosis. Question rheumatoid arthritis, CPPD Electronically Signed   By: Franchot Gallo M.D.   On: 05/25/2020 11:36   MR BRAIN WO CONTRAST  Result  Date: 05/23/2020 CLINICAL DATA:  87 year old male code stroke presentation with left MCA territory implicated. Also history of seizures. EXAM: MRI HEAD WITHOUT CONTRAST TECHNIQUE: Multiplanar, multiecho pulse sequences of the brain and surrounding structures were obtained without intravenous contrast. COMPARISON:  CT head and CTA head and neck today. Prior brain MRI 03/25/2018. FINDINGS: Abbreviated exam by design due to code stroke presentation and IV tPA candidate. Brain: Patchy abnormal diffusion at the left superior perirolandic cortex and involving some subcortical white matter (series 2, image 40 and ADC map series 250, image 39. This more resembles acute ischemia than postictal sequelae. Right face representation area seems most affected. There is currently no associated FLAIR hyperintensity. No convincing T2 hyperintensity. No other abnormal diffusion. Patchy and confluent bilateral cerebral white matter T2 and FLAIR hyperintensity appears stable since 2020. Expected evolution of the punctate right cerebellar infarct that year. No midline shift, mass effect, evidence of mass lesion, ventriculomegaly, extra-axial collection or acute intracranial hemorrhage. Cervicomedullary junction and pituitary are within normal limits. No chronic cerebral blood products identified. Vascular: Major intracranial vascular flow voids are stable since 2020. Skull and upper cervical spine: Chronic severe cervical spine degeneration also demonstrated by CTA today. Bulky chronic ligamentous hypertrophy about the odontoid contributing to multifactorial cervicomedullary junction and C1 level spinal stenosis (series 4, image 13 today). Hardware susceptibility artifact in the cervical spine. Underlying bone marrow signal appears normal. Sinuses/Orbits: Stable, negative. Other: Mastoids remain clear. Visible internal auditory structures appear normal. Postoperative changes to the suboccipital soft tissues. IMPRESSION: 1. Patchy  abnormal diffusion at the left perirolandic cortex most resembling acute ischemia. No T2 FLAIR changes or hemorrhage, mass effect at this time. This was discussed in person with Dr. Roland Rack at [phone] hours. 2. Otherwise stable chronic small vessel disease since the 2020 MRI. 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 Electronically Signed   By: Genevie Ann M.D.   On: 05/23/2020 09:58   DG Chest Port 1 View  Result Date: 05/23/2020 CLINICAL DATA:  Cough.  History of aspiration pneumonia. EXAM: PORTABLE CHEST 1 VIEW COMPARISON:  Radiograph 01/07/2020 FINDINGS: Patient is rotated and leaning to the right. Lung volumes are low. Cardiomegaly with aortic atherosclerosis. Mild left basilar scarring, similar to prior. No acute airspace disease. There may be trace pleural effusions. No pulmonary edema. Surgical hardware in the cervical spine is partially included. IMPRESSION: 1. Low lung volumes with left basilar scarring. No new  airspace disease. 2. Cardiomegaly with aortic atherosclerosis. 3. Possible trace pleural effusions. Electronically Signed   By: Keith Rake M.D.   On: 05/23/2020 20:16   EEG adult  Result Date: 05/24/2020 Lora Havens, MD     05/24/2020  8:47 AM Patient Name: [name] MRN: [id number] Epilepsy Attending: Lora Havens Referring Physician/Provider: Dr. Roland Rack Date: 05/23/2020 Duration: 23.19 mins Patient history: 87 yo male who presented as a code stroke due to aphasia, right sided weakness, left gaze preference.  EEG to evaluate for seizure. Level of alertness: Awake, asleep AEDs during EEG study: VPA Technical aspects: This EEG study was done with scalp electrodes positioned according to the 10-20 International system of electrode placement. Electrical activity was acquired at a sampling rate of 500Hz  and reviewed with a high frequency filter of 70Hz  and a low frequency filter of 1Hz . EEG  data were recorded continuously and digitally stored. Description: The posterior dominant rhythm consists of 7.5 Hz activity of moderate voltage (25-35 uV) seen predominantly in posterior head regions, symmetric and reactive to eye opening and eye closing. Sleep was characterized by sleep spindles (12 to 14 Hz), maximal frontocentral region.  EEG also showed continuous generalized 3 to 7 Hz theta-delta slowing.  Hyperventilation and photic stimulation were not performed.   ABNORMALITY - Continuous slow, generalized IMPRESSION: This study is suggestive of mild to moderate diffuse encephalopathy, nonspecific etiology.  No seizures or epileptiform discharges were seen throughout the recording. Lora Havens   ECHOCARDIOGRAM COMPLETE  Result Date: 05/23/2020    ECHOCARDIOGRAM REPORT   Patient Name:   CHAVIS TESSLER Date of Exam: 05/23/2020 Medical Rec #:  [id number]      Height:       69.0 in Accession #:    [id number]     Weight:       154.5 lb Date of Birth:  [birth year]      BSA:          1.852 m Patient Age:    76 years       BP:           137/74 mmHg Patient Gender: M              HR:           75 bpm. Exam Location:  Inpatient Procedure: 2D Echo, Cardiac Doppler, Color Doppler and Intracardiac            Opacification Agent Indications:    Stroke  History:        Patient has prior history of Echocardiogram examinations, most                 recent 03/25/2018. TIA, Arrythmias:Atrial Fibrillation; Risk                 Factors:Former Smoker and Hypertension. GERD.  Sonographer:    Clayton Lefort RDCS (AE) Referring Phys: [phone] MCNEILL P [facility]  Sonographer Comments: Technically difficult study due to poor echo windows and suboptimal subcostal window. IMPRESSIONS  1. Left ventricular ejection fraction, by estimation, is 55 to 60%. The left ventricle has normal function. The left ventricle has no regional wall motion abnormalities. There is severe concentric left ventricular hypertrophy. Left ventricular diastolic   function could not be evaluated.  2. Right ventricular systolic function is normal. The right ventricular size is normal.  3. Left atrial size was moderately dilated.  4. Right atrial size was mildly dilated.  5. The mitral valve is grossly  normal. Mild mitral valve regurgitation.  6. The aortic valve is grossly normal. Aortic valve regurgitation is trivial. FINDINGS  Left Ventricle: Left ventricular ejection fraction, by estimation, is 55 to 60%. The left ventricle has normal function. The left ventricle has no regional wall motion abnormalities. Definity contrast agent was given IV to delineate the left ventricular  endocardial borders. The left ventricular internal cavity size was small. There is severe concentric left ventricular hypertrophy. Left ventricular diastolic function could not be evaluated due to atrial fibrillation. Left ventricular diastolic function  could not be evaluated. Right Ventricle: The right ventricular size is normal. No increase in right ventricular wall thickness. Right ventricular systolic function is normal. Left Atrium: Left atrial size was moderately dilated. Right Atrium: Right atrial size was mildly dilated. Pericardium: There is no evidence of pericardial effusion. Mitral Valve: The mitral valve is grossly normal. Mild mitral valve regurgitation. Tricuspid Valve: The tricuspid valve is grossly normal. Tricuspid valve regurgitation is mild. Aortic Valve: The aortic valve is grossly normal. Aortic valve regurgitation is trivial. Aortic valve mean gradient measures 2.0 mmHg. Aortic valve peak gradient measures 3.5 mmHg. Aortic valve area, by VTI measures 2.87 cm. Pulmonic Valve: The pulmonic valve was grossly normal. Pulmonic valve regurgitation is trivial. Aorta: The aortic root and ascending aorta are structurally normal, with no evidence of dilitation. IAS/Shunts: The atrial septum is grossly normal.  LEFT VENTRICLE PLAX 2D LVIDd:         3.00 cm LVIDs:         2.20 cm LV PW:          1.90 cm LV IVS:        1.90 cm LVOT diam:     1.90 cm LV SV:         45 LV SV Index:   24 LVOT Area:     2.84 cm  RIGHT VENTRICLE RV Basal diam:  2.80 cm RV S prime:     11.20 cm/s TAPSE (M-mode): 1.4 cm LEFT ATRIUM           Index       RIGHT ATRIUM           Index LA diam:      4.00 cm 2.16 cm/m  RA Area:     20.70 cm LA Vol (A2C): 52.4 ml 28.30 ml/m RA Volume:   48.50 ml  26.19 ml/m LA Vol (A4C): 81.7 ml 44.12 ml/m  AORTIC VALVE AV Area (Vmax):    2.57 cm AV Area (Vmean):   2.43 cm AV Area (VTI):     2.87 cm AV Vmax:           94.20 cm/s AV Vmean:          63.733 cm/s AV VTI:            0.156 m AV Peak Grad:      3.5 mmHg AV Mean Grad:      2.0 mmHg LVOT Vmax:         85.30 cm/s LVOT Vmean:        54.600 cm/s LVOT VTI:          0.158 m LVOT/AV VTI ratio: 1.01  AORTA Ao Root diam: 3.90 cm Ao Asc diam:  3.30 cm  SHUNTS Systemic VTI:  0.16 m Systemic Diam: 1.90 cm Mertie Moores MD Electronically signed by Mertie Moores MD Signature Date/Time: 05/23/2020/4:44:58 PM    Final    CT HEAD CODE STROKE WO CONTRAST  Result Date:  05/23/2020 CLINICAL DATA:  Code stroke.  87 year old male. EXAM: CT HEAD WITHOUT CONTRAST TECHNIQUE: Contiguous axial images were obtained from the base of the skull through the vertex without intravenous contrast. COMPARISON:  Brain MRI 03/25/2018.  Head CT 03/24/2018. FINDINGS: Brain: No acute intracranial hemorrhage identified. No midline shift, mass effect, or evidence of intracranial mass lesion. Stable ventricle size and configuration. Patchy and confluent bilateral white matter hypodensity does not appear significantly changed since 2020. No cortically based acute infarct identified. No cortical encephalomalacia identified. Vascular: Extensive Calcified atherosclerosis at the skull base. No suspicious intracranial vascular hyperdensity. Skull: No acute osseous abnormality identified. Sinuses/Orbits: Visualized paranasal sinuses and mastoids are clear. Other: No acute orbit or  scalp soft tissues. ASPECTS [facility] Stroke Program Early CT Score) Total score (0-10 with 10 being normal): 10 IMPRESSION: 1. No acute cortically based infarct or acute intracranial hemorrhage identified. ASPECTS 10. 2. Chronic white matter disease appears stable since 2020. 3. These results were communicated to Dr. Leonel Ramsay at 9:17 am on 05/23/2020 by text page via the [facility] messaging system. Electronically Signed   By: Genevie Ann M.D.   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 No LVO on imaging. + acute ischemia noted left perirolandic cortex on MRI. tPA was given. Patient's presenting symptoms beginning to resolve somewhat during and after tPA. NP alerted RN to left arm purplish hematoma at bedside post tPA. Symptoms not consistent with his seizure presentations in the past. Depakote level was low on admission.   HOSPITAL COURSE Patient was admitted and further work up for stroke was undertaken.  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 No acute cortically based infarct or acute intracranial hemorrhage identified. ASPECTS 10. 2. Chronic white matter disease appears stable since 2020.   CTA head and neck  1. Negative for emergent large vessel occlusion. Left MCA branches appear stable from a 2020 CTA. This was reviewed in person with Dr. Roland Rack at [phone] hours.  2. Positive for chronic arterial tortuosity and calcified plaque in the head and neck. Chronic moderate to severe bilateral vertebral artery stenoses: - Severe Right  Vertebral Artery origin and V4 segment. - Moderate Left Vertebral V4 segment.  3. Chronic very severe cervical spine degeneration. 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   MRI brain 1. Patchy abnormal diffusion at the left perirolandic cortex most resembling acute ischemia. No T2 FLAIR changes or hemorrhage, mass effect at this time. This was discussed in person with Dr. Roland Rack at [phone] hours.  2. Otherwise stable chronic small vessel disease since the 2020 MRI. 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 2D Echo EF 55-50%, severe LVH, Left atrial dilatation. No thrombus, wall motion abnormality or shunt found.  LDL 51 HgbA1c 5.1 VTE prophylaxis -        Diet    DIET - DYS 1 Room service appropriate? No; Fluid consistency: Nectar Thick        Patient stopped Eliquis for his afib in 2020 on his own.  Per discussion with  pharmacist, Mina Marble, re: impaired renal function and dysphagia diet, Dabigatran 150mg  every 12 hours is recommended. MAY NOT BE CRUSHED.  Wound consult for R amputation completed Therapy recommendations: CIR Disposition:  CIR pending approval and acceptance   Hypertension Home meds:  Cardizem, lasix and metoprolol restarted  Stable Permissive hypertension (OK if < 220/120) but gradually normalize in 5-7 days Long-term BP goal normotensive   Hyperlipidemia LDL 51, at goal < 70 High intensity statin: Lipitor 80mg  daily Continue statin at discharge   Dysphagia -SLP following -On dysphagia diet -Crush meds recommended, however patient seems to be tolerating whole medications per pharmacist, [place], Rph, asked to review meds for crush appropriateness   CKD3 -baseline ctn approx 1.3 -Monitor BMP daily    CHF -Lasix restarted -Saline locked  -Monitor po intake  -Unable to obtain daily weights d/t amputation  Seizure Hx Depakote increased  and level is pending 3/18   Right LE amputation care -Wound care RN consulted and provided recs and ordered wound care  -Monitor pink area in the knee region   Other Stroke Risk Factors Advanced Age >/= 66  Former Cigarette smoker: 5 pack year history Coronary artery disease: Stent to RCA, Penta stent, 99% reduced to 0% 2002. CHF, chronic diastolic       DISCHARGE EXAM Blood pressure (!) 148/72, pulse 84, temperature 97.9 F (36.6 C), temperature source Oral, resp. rate 18, height 5' 9.02" (1.753 m), weight 70.1 kg, SpO2 99 %.  Constitutional: Appears well-developed and well-nourished.  Psych: Affect appropriate to situation Eyes: No scleral injection HENT: No OP obstrucion Head: Normocephalic.  Cardiovascular: Normal rate and regular rhythm.  Respiratory: Effort normal and breath sounds normal to anterior ascultation GI: Soft.  No distension. There is no tenderness.  Skin: WDI. Purplish discoloration to BUEs.    Neuro: Mental Status: Patient is awake, alert sitting up in bed. Attentive to examiner. Unable to answer orientation questions on first exam due to expressive aphasia. Patient is unable to give a clear and coherent history. Marland Kitchen Speech is not fluent. Can not name objects or repeat sentence. Comprehension is intact.  Cranial Nerves: II: Visual Fields are full. Left gaze preference on arrival, later overcome. Pupils are equal, round, and reactive to light. No blink to threat originally on right.  III,IV, VI: EOMI without ptosis or diploplia.  V: Facial sensation is symmetric to temperature VII: Facial movement is symmetric.  VIII: hearing is intact to voice X: Uvula elevates symmetrically XI: Shoulder shrug is symmetric. XII: tongue is midline without atrophy or fasciculations.  Motor: Tone is normal. Bulk is normal. Strength right grip 4/5, right biceps 3+/5, right triceps 4/5. LLE/LUE 5/5.  Sensory: Sensation is symmetric to light touch and temperature in the arms  and legs.  Plantars: Toes are downgoing bilaterally.  Cerebellar: FNF intact. Discharge Diet      Diet   DIET - DYS 1 Room service appropriate? No; Fluid consistency: Nectar Thick   liquids  DISCHARGE PLAN Disposition:  Transfer to [place] for ongoing PT, OT and ST Recommend ongoing stroke risk factor control by Primary Care Physician at time of discharge from inpatient rehabilitation. Follow-up PCP [name], MD in 2 weeks following discharge from rehab. Follow-up in [facility] in 4 weeks following discharge from rehab, office to schedule an appointment.   34 minutes were spent preparing discharge.  Delila A Bailey-Modzik, NP-C. MSN  I have personally obtained history,examined this patient, reviewed notes, independently viewed imaging  studies, participated in medical decision making and plan of care.ROS completed by me personally and pertinent positives fully documented  I have made any additions or clarifications directly to the above note. Agree with note above.    Antony Contras, MD Medical Director [place] Pager: [phone] 05/27/2020 5:23 PM

## 2020-05-26 NOTE — PMR Pre-admission (Signed)
PMR Admission Coordinator Pre-Admission Assessment  Patient: [name] is an 87 y.o., male MRN: [id number] DOB: [birth year] Height: 5' 9.02" (175.3 cm) Weight: 70.1 kg              Insurance Information HMO:     PPO:      PCP:      IPA:      80/20:      OTHER:  PRIMARY: Medicare a and b      Policy#: [id number]      Subscriber: pt Benefits:  Phone #: passport one online     Name: 3/16 Eff. Date: 10/10/2000     Deduct: $1556      Out of Pocket Max: none      Life Max: none  CIR: 100%      SNF: 20 full days Outpatient: 80%     Co-Pay: 20% Home Health: 100%      Co-Pay: none DME: 80%     Co-Pay: 20% Providers: pt choice  SECONDARY: BCBS supplement      Policy#: [id number]  Financial Counselor:       Phone#:   The "Data Collection Information Summary" for patients in Inpatient Rehabilitation Facilities with attached "Privacy Act [place] Records" was provided and verbally reviewed with: Family  Emergency Contact Information Contact Information    Name Relation Home Work Mobile   Ahnaf, Caponi [phone]  [phone]     Current Medical History  Patient Admitting Diagnosis: CVA  History of Present Illness: 87 year old right-handed male with history of atrial fibrillation who had been on Eliquis in the past but self DC 'd due to leg hematoma, CAD with stenting maintained on aspirin, diastolic congestive heart failure, CKD stage III, hypertension, hyperlipidemia, seizure disorder maintained on Depakote followed by Dr. 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    Patient uses transfer board in and out of wheelchair he no longer uses his prosthesis due to skin issues.  Wife assist with bathing and dressing as well as peri care.  Presented 05/23/2020 with right side weakness left gaze preference and inability to speak.  Cranial CT scan negative for acute changes.  CT angiogram of head and neck  negative for emergent large vessel occlusion.  Left MCA branches appeared stable from 2020 CTA.  Patient did receive TPA.  EEG negative for seizure but suggestive of mild to moderate diffuse encephalopathy.  MRI showed patchy abnormal diffusion at the left perirolandic cortex most resembling acute ischemia.  No substantial edema or mass-effect in that region.  Echocardiogram with ejection fraction of 55 to 60% no wall motion abnormalities.  Maintained on Pradaxa for CVA prophylaxis.  Admission chemistries unremarkable except glucose 153 BUN 36 creatinine 1.39 hemoglobin 10.5 valproic acid level 41.  Maintain on a dysphagia #1 nectar thick liquid diet.    Complete NIHSS TOTAL: 4 Glasgow Coma Scale Score: 14  Past Medical History  Past Medical History:  Diagnosis Date  . Abnormality of gait 09/22/2015  . Arthritis   . Atrial fibrillation ([place])   . CAD (coronary artery disease)    Stent to RCA, Penta stent, 99% reduced to 0% 2002.  . Cancer ([place])    skin CA removed from back  . 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 Chronic insomnia 03/30/2015  . Chronic kidney disease, stage 3 ([place])   . Complication of anesthesia    trouble waking up  .  GERD (gastroesophageal reflux disease)   . Hypercholesteremia   . Hypertension   . Hypothyroidism   . Iron deficiency anemia   . Memory difficulty 09/22/2015  . Osteoarthritis   . PAD (peripheral artery disease) ([place])   . Pneumonia   . Seizures ([place])   . Sepsis ([place]) 05/2017  . Transient alteration of awareness 03/30/2015  . Vertigo    hx of  . Vitamin D deficiency     Family History  family history includes Cancer in his mother; Heart disease in his father; Hypertension in his mother; Kidney failure in his father.  Prior Rehab/Hospitalizations:  Has the patient had prior rehab or hospitalizations prior to admission? Yes  Has the patient had major surgery during 100 days prior to admission? No  Current Medications    Current Facility-Administered Medications:  .  acetaminophen (TYLENOL) tablet 650 mg, 650 mg, Oral, Q4H PRN **OR** acetaminophen (TYLENOL) 160 MG/5ML solution 650 mg, 650 mg, Per Tube, Q4H PRN **OR** acetaminophen (TYLENOL) suppository 650 mg, 650 mg, Rectal, Q4H PRN, Bailey-Modzik, Delila A, NP .  atorvastatin (LIPITOR) tablet 80 mg, 80 mg, Oral, Daily, Bailey-Modzik, Delila A, NP, 80 mg at 05/25/20 0913 .  dabigatran (PRADAXA) capsule 150 mg, 150 mg, Oral, Q12H, von Dohlen, Haley B, RPH, 150 mg at 05/25/20 1755 .  diltiazem (CARDIZEM CD) 24 hr capsule 240 mg, 240 mg, Oral, Daily, Bailey-Modzik, Delila A, NP, 240 mg at 05/25/20 1340 .  divalproex (DEPAKOTE ER) 24 hr tablet 500 mg, 500 mg, Oral, BID, Bailey-Modzik, Delila A, NP, 500 mg at 05/25/20 2209 .  doxazosin (CARDURA) tablet 4 mg, 4 mg, Oral, QHS, Bailey-Modzik, Delila A, NP, 4 mg at 05/25/20 2209 .  furosemide (LASIX) tablet 20 mg, 20 mg, Oral, Daily, Bailey-Modzik, Delila A, NP .  HYDROcodone-acetaminophen (NORCO) 10-325 MG per tablet 1 tablet, 1 tablet, Oral, Q4H PRN, Bailey-Modzik, Delila A, NP, 1 tablet at 05/25/20 1736 .  levothyroxine (SYNTHROID) tablet 150 mcg, 150 mcg, Oral, QAC breakfast, Bailey-Modzik, Delila A, NP, 150 mcg at 05/26/20 0608 .  metoprolol tartrate (LOPRESSOR) tablet 25 mg, 25 mg, Oral, BID, Bailey-Modzik, Delila A, NP, 25 mg at 05/25/20 2209 .  pantoprazole (PROTONIX) EC tablet 40 mg, 40 mg, Oral, QHS, Bailey-Modzik, Delila A, NP, 40 mg at 05/25/20 2209 .  predniSONE (DELTASONE) tablet 4 mg, 4 mg, Oral, Q breakfast, Bailey-Modzik, Delila A, NP, 4 mg at 05/25/20 0733 .  Resource ThickenUp Clear, , Oral, PRN, Bailey-Modzik, Delila A, NP  Patients Current Diet:  Diet Order            DIET - DYS 1 Room service appropriate? No; Fluid consistency: Nectar Thick  Diet effective now                 Precautions / Restrictions Precautions Precautions: Fall Precaution Comments: very thin skin, skin tears easily  with multiple sites of skin breakdown on lateral aspect of R residual limb, skin tear under BP cuff; R neglect? Restrictions Weight Bearing Restrictions: No   Has the patient had 2 or more falls or a fall with injury in the past year?No  Prior Activity Level Limited Community (1-2x/wk): wife assisted with all adls  Prior Functional Level Prior Function Level of Independence: Needs assistance Gait / Transfers Assistance Needed: pt uses transfer board for transfer in and out of w/c, does not don prosthesis anymore ADL's / Homemaking Assistance Needed: Pt's wife dresses, bathes, performs pericare for pt Comments: wife reports incontinence of stool is not abnormal  for patient  Self Care: Did the patient need help bathing, dressing, using the toilet or eating?  Dependent  Indoor Mobility: Did the patient need assistance with walking from room to room (with or without device)? Dependent  Stairs: Did the patient need assistance with internal or external stairs (with or without device)? Dependent  Functional Cognition: Did the patient need help planning regular tasks such as shopping or remembering to take medications? Dependent  Home Assistive Devices / Equipment Home Assistive Devices/Equipment: [employer] board Home Equipment: Walker - 4 wheels,Cane - single point,Bedside commode,Shower seat,Wheelchair - manual,Grab bars - tub/shower,Transport chair,Hospital bed,Other (comment) (prosthetic)  Prior Device Use: Indicate devices/aids used by the patient prior to current illness, exacerbation or injury? Manual wheelchair  Current Functional Level Cognition  Arousal/Alertness: Awake/alert Overall Cognitive Status: History of cognitive impairments - at baseline Orientation Level: Oriented X4 Following Commands: Follows one step commands with increased time Safety/Judgement: Decreased awareness of safety,Decreased awareness of deficits General Comments: Pt with poor awareness of  deficits, displaying possible R side neglect as he needed reminders to attend to R side and look to R. Attention: Sustained Sustained Attention: Appears intact Memory: Impaired Memory Impairment: Decreased recall of new information Awareness: Impaired Awareness Impairment: Anticipatory impairment Problem Solving: Impaired Problem Solving Impairment: Verbal basic Safety/Judgment: Impaired    Extremity Assessment (includes Sensation/Coordination)  Upper Extremity Assessment: Generalized weakness,LUE deficits/detail (wife reports arms are close to baseline and L UE has hx of "frozen shoulder") LUE Deficits / Details: hx of frozen shoulder arom elevation to 50 degrees  Lower Extremity Assessment: Defer to PT evaluation,RLE deficits/detail RLE Deficits / Details: noted to have wound lateral aspect behind knee cap and just medially that are PTA per wife and only two wounds left to heal. Pt noted to have redness and warm space right at the patella. wife states "i am so worried about that. it does not look good"    ADLs  Overall ADL's : Needs assistance/impaired Eating/Feeding: Maximal assistance,Bed level Eating/Feeding Details (indicate cue type and reason): positioned for lunch tray with pillows in chair position General ADL Comments: total (A) for all adls at this time. pt would benefit from hoyer lift only due to risk for falls and skin break down    Mobility  Overal bed mobility: Needs Assistance Bed Mobility: Rolling,Sidelying to Sit,Sit to Supine Rolling: Min assist,Mod assist Sidelying to sit: Mod assist,+2 for physical assistance Sit to supine: Mod assist,+2 for physical assistance General bed mobility comments: Min-modA for rolling bilaterally for truncal and LE translation and cues to reach across body for contralateral rail. Management of legs and trunk to transition to sit and back to supine L EOB with modAx2. Pt forgetting to manage R side towards EOB to square with EOB upon  coming to sit, needing cues to attend to R side.    Transfers  Overall transfer level: Needs assistance Transfers: Lateral/Scoot Transfers  Lateral/Scoot Transfers: Max assist,+2 physical assistance,+2 safety/equipment General transfer comment: MaxAx2 with cues for L hand on L HOB rail and R hand on bed to push up to clear buttocks for lateral scoot to L, 3x. Pt not tolerating L knee block.    Ambulation / Gait / Stairs / Wheelchair Mobility  Ambulation/Gait General Gait Details: unable    Posture / Balance Dynamic Sitting Balance Sitting balance - Comments: fair to poor, requiring min PT assist for posterior support with fatigue Balance Overall balance assessment: Needs assistance Sitting-balance support: Bilateral upper extremity supported,Feet supported Sitting balance-Leahy Scale: Poor Sitting  balance - Comments: fair to poor, requiring min PT assist for posterior support with fatigue Postural control: Posterior lean Standing balance comment: unable    Special needs/care consideration Meryle Ready, RN  Registered Nurse  WOC  Consult Note     Signed  Date of Service:  05/25/2020 11:09 AM           Signed         Show:Clear all [x] Manual[x] Template[x] Copied  Added by: [x] Meryle Ready, RN   [] Hover for details  WOC Nurse Consult Note: Patient receiving care in [facility] 4N16. Spouse, daughter, and primary RN present at time of my assessment. Reason for Consult: right BKA stump care Wound type: NO wounds present Pressure Injury POA: Yes/No/NA Measurement: Wound bed: Drainage (amount, consistency, odor)  Periwound: Dressing procedure/placement/frequency: The following care instructions were entered:   For the right stump, follow these instructions explicitly: ONLY the wife will wash the right leg from the knee down, and she will use the lotion she has to moisturize the area. Elevate the leg on a pillow to prevent the stump from resting on the mattress. Do NOT  place a foam dressing on the stump. The patient has decided he does NOT want to wear the stump sock shrinker provided by Dr. [name]. AND, MONITOR THE PINK SPOT ON THE KNEE AREA FOR of these develop.   Thank you for the consult.  Discussed plan of care with the patient and bedside nurse.  [place] nurse will not follow at this time.  Please re-consult the [place] team if needed.   Val Riles, RN, MSN, CWOCN, CNS-BC, pager [phone]           Note Details  Author Meryle Ready, RN File Time 05/25/2020 11:11 AM  Author Type Registered Nurse Status Signed  Last Editor Meryle Ready, RN Service [place] # [id number] Admit Date 05/23/2020      Previous Home Environment  Living Arrangements: Spouse/significant other  Lives With: Spouse Available Help at Discharge: Family,Available 24 hours/day Type of Home: House Home Layout: One level Home Access: Ramped entrance Bathroom Shower/Tub: Chiropodist: Handicapped height Bathroom Accessibility: Yes [place]: Yes [place] (if known): [place] Additional Comments: transfer board  Discharge Living Setting Plans for Discharge Living Setting: Patient's home,Lives with (comment) (wife) Type of Home at Discharge: House Discharge Home Layout: One level Discharge Home Access: [place] entrance Discharge Bathroom Shower/Tub: [place] unit Discharge Bathroom Toilet: Handicapped height Discharge Bathroom Accessibility: Yes How Accessible: Accessible via wheelchair Does the patient have any problems obtaining your medications?: No  Social/Family/Support Systems Contact Information: wife, [name] Anticipated Caregiver: wife and I have recommended hired assist beyond [place]: see above Ability/Limitations of Caregiver: none Caregiver Availability: 24/7 Discharge Plan Discussed with Primary Caregiver: Yes Is Caregiver In Agreement with Plan?:  Yes Does Caregiver/Family have Issues with Lodging/Transportation while Pt is in Rehab?: No  Goals Patient/Family Goal for Rehab: min assist PT and OT at wheelchair level with slide board transfers, supervision with SLP Expected length of stay: [facility] 16 to 18 days Additional Information: Patient is a retired Software engineer Pt/Family Agrees to Admission and willing to participate: Yes Program Orientation Provided & Reviewed with Pt/Caregiver Including Roles  & Responsibilities: Yes  Decrease burden of Care through IP rehab admission: n/a  Possible need for SNF placement upon discharge:not anticipated  Patient Condition: This patient's condition remains as documented in the consult dated 05/25/2020, in which the  Rehabilitation Physician determined and documented that the patient's condition is appropriate for intensive rehabilitative care in an inpatient rehabilitation facility. Will admit to inpatient rehab today.  Preadmission Screen Completed By:  Cleatrice Burke, RN, 05/26/2020 10:27 AM ______________________________________________________________________   Discussed status with Dr. Dagoberto Ligas on 05/26/2020 at 1034 and received approval for admission today.  Admission Coordinator:  Cleatrice Burke, time 9379 Date 05/26/2020

## 2020-05-26 NOTE — TOC Transition Note (Signed)
Transition of Care [facility]) - CM/SW Discharge Note   Patient Details  Name: [name] MRN: [id number] Date of Birth: 1933/11/22  [facility] CM/SW Contact:  Pollie Friar, RN Phone Number: 05/26/2020, 10:50 AM   Clinical Narrative:    Patient is discharging to [place] today. TOC signing off.   Final next level of care: IP Rehab Facility Barriers to Discharge: No Barriers Identified   Patient Goals and CMS Choice Patient states their goals for this hospitalization and ongoing recovery are:: to sit up/get better CMS Medicare.gov Compare Post Acute Care list provided to:: Patient    Discharge Placement                       Discharge Plan and Services   Discharge Planning Services: CM Consult                                 Social Determinants of Health (SDOH) Interventions     Readmission Risk Interventions Readmission Risk Prevention Plan 01/11/2020  Transportation Screening Complete  PCP or Specialist Appt within 5-7 Days Complete  Home Care Screening Complete  Medication Review (RN CM) Complete  Some recent data might be hidden

## 2020-05-26 NOTE — H&P (Signed)
Physical Medicine and Rehabilitation Admission H&P    No chief complaint on file. : HPI: Robert Strahm. [name] is an 87 year old right-handed male with history of atrial fibrillation who had been on Eliquis in the past but self DC'd due to leg hematoma, CAD with stenting maintained on aspirin, diastolic congestive heart failure, CKD stage III, hypertension, rheumatoid arthritis maintained on chronic prednisone per rheumatology services Dr. Jeanett Schlein, hyperlipidemia, seizure disorder maintained on Depakote followed by Dr. 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  Per chart review patient lives with spouse.  He is a retired Software engineer.  1 level home with ramped entrance.  Patient uses transfer board in and out of wheelchair he no longer uses his prosthesis due to skin issues.  Wife assist with bathing and dressing as well as pericare.  Presented 05/23/2020 with right side weakness left gaze preference and inability to speak.  Cranial CT scan negative for acute changes.  CT angiogram of head and neck negative for emergent large vessel occlusion.  Left MCA branches appeared stable from 2020 CTA.  Patient did receive TPA.  EEG negative for seizure but suggestive of mild to moderate diffuse encephalopathy.  MRI showed patchy abnormal diffusion at the left perirolandic cortex most resembling acute ischemia.  No substantial edema or mass-effect in that region.  Echocardiogram with ejection fraction of 55 to 60% no wall motion abnormalities.  Maintained on Pradaxa for CVA prophylaxis.  Admission chemistries unremarkable except glucose 153 BUN 36 creatinine 1.39 hemoglobin 10.5 valproic acid level 41.  Maintain on a dysphagia #1 nectar thick liquid diet.  Therapy evaluations completed due to patient decreased functional mobility was admitted for a comprehensive rehab program.  Pt reports no pain right now, however usually has generalized pain-  aching- got Norco 1-2 hours ago.  LBM 1-2 days ago which is normal for him- voiding OK.  Had episode where current nurse took his L AC fossa IV out, however due to his [facility] that he's on; he had moderate-severe bleeding event within ~ 5 minutes- there was a puddle of dark clotted blood on bed- soaked through bandage, his gown and thick under person pad as well as sheet.   Review of Systems  Constitutional: Negative for chills and fever.  HENT: Negative for hearing loss.   Eyes: Negative for blurred vision and double vision.  Respiratory: Negative for cough and shortness of breath.   Cardiovascular: Positive for palpitations and leg swelling.  Gastrointestinal: Positive for constipation. Negative for heartburn and nausea.       GERD  Genitourinary: Negative for flank pain and hematuria.       Incontinence of bowel and bladder  Musculoskeletal: Positive for joint pain and myalgias.  Skin: Negative for rash.  Neurological: Positive for dizziness, speech change, seizures and weakness.  Psychiatric/Behavioral: Positive for memory loss.  All other systems reviewed and are negative.  Past Medical History:  Diagnosis Date  . Abnormality of gait 09/22/2015  . Arthritis   . Atrial fibrillation ([place])   . CAD (coronary artery disease)    Stent to RCA, Penta stent, 99% reduced to 0% 2002.  . Cancer ([place])    skin CA removed from back  . 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 Chronic insomnia 03/30/2015  . Chronic kidney disease, stage 3 ([place])   . Complication of anesthesia    trouble waking up  . GERD (gastroesophageal reflux disease)   .  Hypercholesteremia   . Hypertension   . Hypothyroidism   . Iron deficiency anemia   . Memory difficulty 09/22/2015  . Osteoarthritis   . PAD (peripheral artery disease) ([place])   . Pneumonia   . Seizures ([place])   . Sepsis ([place]) 05/2017  . Transient alteration of awareness 03/30/2015  . Vertigo    hx of  . Vitamin D deficiency     Past Surgical History:  Procedure Laterality Date  . AMPUTATION Right 03/28/2018   Procedure: AMPUTATION BELOW KNEE;  Surgeon: Newt Minion, MD;  Location: [place];  Service: Orthopedics;  Laterality: Right;  . APPLICATION OF WOUND VAC Right 01/23/2019   Procedure: Application Of  Prevena Wound Vac;  Surgeon: Newt Minion, MD;  Location: [place];  Service: Orthopedics;  Laterality: Right;  . BACK SURGERY    . BIOPSY  01/08/2020   Procedure: BIOPSY;  Surgeon: Gatha Mayer, MD;  Location: [place];  Service: Endoscopy;;  . ESOPHAGOGASTRODUODENOSCOPY (EGD) WITH PROPOFOL N/A 01/08/2020   Procedure: ESOPHAGOGASTRODUODENOSCOPY (EGD) WITH PROPOFOL;  Surgeon: Gatha Mayer, MD;  Location: [place];  Service: Endoscopy;  Laterality: N/A;  . EYE SURGERY     Bilateral Cataract surgery   . HERNIA REPAIR    . I & D EXTREMITY Right 05/10/2015   Procedure: IRRIGATION AND DEBRIDEMENT EXTREMITY;  Surgeon: Leanora Cover, MD;  Location: [place];  Service: Orthopedics;  Laterality: Right;  . KNEE ARTHROPLASTY     right knee X 2; left knee once  . LAMINECTOMY     X 6  . LEG AMPUTATION BELOW KNEE Right 03/28/2018  . POSTERIOR CERVICAL FUSION/FORAMINOTOMY  01/28/2012   Procedure: POSTERIOR CERVICAL FUSION/FORAMINOTOMY LEVEL 3;  Surgeon: [name], MD;  Location: [place] [facility];  Service: Neurosurgery;  Laterality: Left;  Posterior Cervical Five-Thoracic One Fusion, Arthrodesis with LEFT Cervical Seven-thoracic One Laminectomy, Foraminotomy and Resection of Synovial Cyst  . POSTERIOR CERVICAL FUSION/FORAMINOTOMY N/A 01/29/2013   Procedure: POSTERIOR CERVICAL FUSION/FORAMINOTOMY LEVEL 1 and C2-5 Posteriolateral Arthrodesis;  Surgeon: [name], MD;  Location: [place] [facility];  Service: Neurosurgery;  Laterality: N/A;  C2-C3 Laminectomy C2-C3 posterior cervical arthrodesis  . STUMP REVISION Right 01/23/2019   Procedure: REVISION RIGHT BELOW KNEE AMPUTATION;  Surgeon: Newt Minion, MD;  Location: [place];  Service: Orthopedics;  Laterality: Right;  . TONSILLECTOMY     Family History  Problem Relation Age of Onset  . Hypertension Mother   . Cancer Mother   . Kidney failure Father   . Heart disease Father    Social History:  reports that he has quit smoking. He has a 5.00 pack-year smoking history. He has never used smokeless tobacco. He reports previous alcohol use. He reports that he does not use drugs. Allergies:  Allergies  Allergen Reactions  . Eliquis [Apixaban] Other (See Comments)    bleeding  . Demerol [Meperidine] Nausea And Vomiting  . Keppra [Levetiracetam] Other (See Comments)    Causes sleepiness, mental status changes  . Vimpat [Lacosamide] Other (See Comments)    Over sedated    Medications Prior to Admission  Medication Sig Dispense Refill  . acetaminophen (TYLENOL) 160 MG/5ML solution Place 20.3 mLs (650 mg total) into feeding tube every 4 (four) hours as needed for mild pain (or temp > 37.5 C (99.5 F)). 120 mL 0  . acetaminophen (TYLENOL) 325 MG tablet Take 2 tablets (650 mg total) by mouth every 4 (four) hours as needed for mild pain (  or temp > 37.5 C (99.5 F)).    Marland Kitchen acetaminophen (TYLENOL) 650 MG suppository Place 1 suppository (650 mg total) rectally every 4 (four) hours as needed for mild pain (or temp > 37.5 C (99.5 F)). 12 suppository 0  . alendronate (FOSAMAX) 70 MG tablet Take 70 mg by mouth every Wednesday. Remain upright for 30-60 minutes.    Marland Kitchen atorvastatin (LIPITOR) 80 MG tablet Take 1 tablet (80 mg total) by mouth daily.    . Calcium Carb-Cholecalciferol (CALCIUM 600+D) 600-800 MG-UNIT TABS Take 1 tablet by mouth daily.    . dabigatran (PRADAXA) 150 MG CAPS capsule Take 1 capsule (150 mg total) by mouth every 12 (twelve) hours. 60 capsule   . diltiazem (CARDIZEM CD) 240 MG 24 hr capsule TAKE 1 CAPSULE BY MOUTH EVERY MORNING. HOLD IF SYSTOLIC BLOOD PRESSURE IS LESS THAN 100 (Patient taking differently: Take 240 mg by mouth  daily. Hold if systolic blood pressure is less than 100.) 90 capsule 3  . divalproex (DEPAKOTE ER) 500 MG 24 hr tablet Take 1 tablet (500 mg total) by mouth 2 (two) times daily.    Marland Kitchen docusate sodium (COLACE) 100 MG capsule Take 100 mg by mouth daily as needed for mild constipation.    Marland Kitchen doxazosin (CARDURA) 4 MG tablet TAKE 1 TABLET(4 MG) BY MOUTH AT BEDTIME (Patient taking differently: Take 4 mg by mouth at bedtime.) 90 tablet 3  . furosemide (LASIX) 20 MG tablet Take 1 tablet (20 mg total) by mouth daily. 90 tablet 3  . HYDROcodone-acetaminophen (NORCO) 10-325 MG tablet Take 1 tablet by mouth every 4 (four) hours as needed for pain.    Marland Kitchen levothyroxine (SYNTHROID, LEVOTHROID) 150 MCG tablet Take 150 mcg by mouth daily before breakfast.    . lidocaine (LIDODERM) 5 % Place 1-5 patches onto the skin daily. Remove & Discard patch within 12 hours or as directed by MD (lower back and shoulders)    . Maltodextrin-Xanthan Gum (RESOURCE THICKENUP CLEAR) POWD Take 1 Container by mouth as needed.    . metoprolol tartrate (LOPRESSOR) 25 MG tablet TAKE 1 TABLET BY MOUTH TWICE DAILY WITH A MEAL OR IMMEDIATELY AFTER (Patient taking differently: Take 25 mg by mouth 2 (two) times daily. WITH A MEAL OR IMMEDIATELY AFTER) 180 tablet 3  . Multiple Vitamins-Minerals (CERTAVITE/ANTIOXIDANTS) TABS Take 1 tablet by mouth every evening.     . nitrofurantoin (MACRODANTIN) 100 MG capsule Take 100 mg by mouth daily.    . nitroGLYCERIN (NITROSTAT) 0.4 MG SL tablet Place 1 tablet (0.4 mg total) under the tongue every 5 (five) minutes as needed for chest pain (call 911 afer 3 doses and if chest pain persists). 25 tablet 11  . pantoprazole (PROTONIX) 40 MG tablet Take 1 tablet (40 mg total) by mouth 2 (two) times daily. (Patient not taking: Reported on 05/23/2020) 60 tablet 0  . polyethylene glycol (MIRALAX / GLYCOLAX) 17 g packet Take 17 g by mouth at bedtime.    . potassium chloride SA (KLOR-CON) 20 MEQ tablet Take 0.5 tablets (10  mEq total) by mouth daily. 45 tablet 3  . predniSONE (DELTASONE) 1 MG tablet Take 4 mg by mouth daily with breakfast.    . predniSONE (DELTASONE) 5 MG tablet Take 5 mg by mouth daily.      Drug Regimen Review Drug regimen was reviewed and remains appropriate with no significant issues identified  Home: Home Living Family/patient expects to be discharged to:: Private residence Living Arrangements: Spouse/significant other   Functional History:  Functional Status:  Mobility:          ADL:    Cognition:      Physical Exam: Blood pressure 122/66, pulse 84, temperature 97.7 F (36.5 C), temperature source Oral, resp. rate 16, height 5\' 9"  (1.753 m), SpO2 97 %. Physical Exam Vitals and nursing note reviewed. Exam conducted with a chaperone present.  Constitutional:      Comments: Pt is frail elderly man sitting up in bed- bleeding from L AC fossa, son at bedside as well as RN and NT- cleaning him up, NAD  HENT:     Head: Normocephalic and atraumatic.     Comments: R lid lag, R facial droop- tongue coated and midline     Nose: Nose normal. No congestion.     Mouth/Throat:     Mouth: Mucous membranes are dry.     Pharynx: Oropharynx is clear.  Eyes:     General:        Right eye: No discharge.        Left eye: No discharge.     Conjunctiva/sclera: Conjunctivae normal.     Comments: L gaze preference, but can look to R with cues.  R lid lag as above- moderately severe  Cardiovascular:     Rate and Rhythm: Rhythm irregular.     Comments: Rate controlled- in afib- rate in 60s-- no JVD Pulmonary:     Comments: CTA B/L- no W/R/R- good air movement Abdominal:     Comments: Soft, NT, ND, (+)BS - hypoactive  Genitourinary:    Comments: Condom catheter in place- amber urine draining Musculoskeletal:     Cervical back: Normal range of motion and neck supple.     Comments: RUE- 3/5 in proximal muscles- 3+/5 distally LUE- 4+/5 in same muscles tested RLE- 3/5 in  HF/KE/KF- has R BKA LLE- 4+/5 in LLE- in HF/KE/DF and PF  R BKA- in shrinker- with mepilex underneath it  Skin:    Comments: Right BKA with dry dressing  L AC fossa required significant pressure to stop active bleeding on bed, soaked dressing/gown and bedding Ecchymoses on B/L UEs and less so on LEs Large R chest purple bruise and B/L UEs- from wrist to shoulder No breakdown on buttocks  Neurological:     Comments: Patient is alert.  Makes eye contact with examiner.  He does have an expressive receptive aphasia as well as some dysarthria.  He did have difficulty naming objects but was able to provide his name.  Follows some simple commands. Pt has some expressive aphasia- his speech appears fluent initially until asked to name objects- decreased amount and also took additional time- word finding deficits- mild Intact to light touch in UEs and LLE/R BKA  Psychiatric:     Comments: Flat, quiet affect     Results for orders placed or performed during the hospital encounter of 05/23/20 (from the past 48 hour(s))  CBC     Status: Abnormal   Collection Time: 05/26/20  4:32 AM  Result Value Ref Range   WBC 6.8 4.0 - 10.5 K/uL   RBC 2.93 (L) 4.22 - 5.81 MIL/uL   Hemoglobin 9.7 (L) 13.0 - 17.0 g/dL   HCT 30.3 (L) 39.0 - 52.0 %   MCV 103.4 (H) 80.0 - 100.0 fL   MCH 33.1 26.0 - 34.0 pg   MCHC 32.0 30.0 - 36.0 g/dL   RDW 17.1 (H) 11.5 - 15.5 %   Platelets 127 (L) 150 - 400 K/uL  Comment: Immature Platelet Fraction may be clinically indicated, consider ordering this additional test [id number] CONSISTENT WITH PREVIOUS RESULT REPEATED TO VERIFY    nRBC 0.0 0.0 - 0.2 %    Comment: Performed at [facility], [place] [address], [place], [place] [address]  Basic metabolic panel     Status: Abnormal   Collection Time: 05/26/20  4:32 AM  Result Value Ref Range   Sodium 142 135 - 145 mmol/L   Potassium 3.7 3.5 - 5.1 mmol/L   Chloride 110 98 - 111 mmol/L   CO2 21 (L) 22 - 32 mmol/L    Glucose, Bld 77 70 - 99 mg/dL    Comment: Glucose reference range applies only to samples taken after fasting for at least 8 hours.   BUN 20 8 - 23 mg/dL   Creatinine, Ser 1.15 0.61 - 1.24 mg/dL   Calcium 8.1 (L) 8.9 - 10.3 mg/dL   GFR, Estimated >60 >60 mL/min    Comment: (NOTE) Calculated using the CKD-EPI Creatinine Equation (2021)    Anion gap 11 5 - 15    Comment: Performed at [facility] [address] [place], [place] [address]   MR BRAIN WO CONTRAST  Result Date: 05/25/2020 CLINICAL DATA:  Stroke follow-up EXAM: MRI HEAD WITHOUT CONTRAST TECHNIQUE: Multiplanar, multiecho pulse sequences of the brain and surrounding structures were obtained without intravenous contrast. COMPARISON:  CT head 05/24/2020.  MRI head 05/23/2020 FINDINGS: Brain: Restricted diffusion in the left perirolandic cortex compatible with acute infarct. Diffusion-weighted imaging is strongly positive and has progressed since the prior study. The distribution of infarct is unchanged from 2 days ago. No associated hemorrhage. There is now FLAIR hyperintensity in the cortex in the area of infarction. Moderate to advanced atrophy. Chronic microvascular ischemic changes in the white matter. No hemorrhage or mass lesion. Vascular: Normal arterial flow voids Skull and upper cervical spine: Anterior and posterior cervical spine fusion. Destructive process involving the dens with large amount of surrounding soft tissue. There is moderate spinal stenosis at the craniocervical junction with displacement of the cord and cord deformity. No interval change. Sinuses/Orbits: Paranasal sinuses clear. Bilateral cataract extraction Other: None IMPRESSION: Acute infarct in the left Perirolandic cortex. Diffusion-weighted imaging is more strongly positive on today's study than the recent MRI. There is now FLAIR hyperintensity in the cortex in this area. No associated hemorrhage Extensive atrophy. Chronic microvascular ischemic change in  the white matter Destructive process of the dens with extensive surrounding soft tissue thickening posterior dens causing moderate spinal stenosis. Question rheumatoid arthritis, CPPD Electronically Signed   By: Franchot Gallo M.D.   On: 05/25/2020 11:36       Medical Problem List and Plan: 1.  Right upper extremity paresis with dysarthria and dysphagia secondary to left MCA distribution infarction  -patient may  Shower with dressing covered on R BKA  -ELOS/Goals: w/c level/transfers min A- 16-18 days 2.  Antithrombotics: -DVT/anticoagulation: Pradaxa  -antiplatelet therapy: N/A 3. Pain Management: Hydrocodone as needed 4. Mood: Provide emotional support  -antipsychotic agents: N/A 5. Neuropsych: This patient is not capable of making decisions on his own behalf. 6. Skin/Wound Care: Routine skin checks 7. Fluids/Electrolytes/Nutrition: Routine in and outs with follow-up chemistries 8.  Dysphagia 1.  Dysphagia #1 nectar thick liquid diet follow-up speech therapy 9.  Atrial fibrillation.  Diltiazem 240 mg daily.  Cardiac rate controlled 10.  CAD with stenting.  Continue Pradaxa.  No chest pain or shortness of breath 11.  CKD stage  III.  Baseline creatinine 1.39-1.60.  Follow-up chemistries 12.  Seizure disorder.  Depakote 500 mg twice daily 13.  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  Follow-up Dr. [name].  Wound care nurse follow-up.  Advise no foam dressing on stump.  Patient had decided he does not want to wear the stump sock shrinker- was wearing when I saw pt.  14.  Hypothyroidism.  Synthroid 15.  Hypertension.  Cardura 4 mg nightly.  Continue Lopressor 25 mg twice daily 16  GERD.  Protonix 17.  Hyperlipidemia.  Lipitor 18.  Diastolic congestive heart failure.  Lasix 20 mg daily.  Monitor for any signs of fluid overload 19.  Rheumatoid arthritis.  Continue chronic prednisone 20. L AC fossa IV d/c'd- active bleeding x10+ minutes- I suggest checking H/H I vs CBC with  diff in AM and assessing- if Hb dropped, this is cause- make sure nursing hold pressure after every blood draw, etc for prolonged period. Of note, dressing saturated, gown/bedding soaked to mattress from IV site- was cleaned up and bleeding stopped with additional pressure.    Lavon Paganini Anguilli- PA-C 05/26/2020    I have personally performed a face to face diagnostic evaluation of this patient and formulated the key components of the plan.  Additionally, I have personally reviewed laboratory data, imaging studies, as well as relevant notes and concur with the physician assistant's documentation above.   The patient's status has not changed from the original H&P.  Any changes in documentation from the acute care chart have been noted above.     Courtney Heys, MD 05/26/2020

## 2020-05-26 NOTE — Progress Notes (Signed)
Inpatient Rehabilitation  Patient information reviewed and entered into eRehab system by Melissa M. Bowie, M.A., CCC/SLP, PPS Coordinator.  Information including medical coding, functional ability and quality indicators will be reviewed and updated through discharge.    

## 2020-05-26 NOTE — Progress Notes (Signed)
Patient asked for inhaler. None ordered. Called on call. Reported sats 97-100% and respirations 18-20. NNO's. Encouraged use of Afrin nasal spray. Patient seemed satisfied with that.

## 2020-05-26 NOTE — Telephone Encounter (Signed)
done

## 2020-05-26 NOTE — H&P (Incomplete)
Physical Medicine and Rehabilitation Admission H&P    Chief Complaint  Patient presents with  . Code Stroke  : HPI: [name]. [name] is an 87 year old right-handed male with history of atrial fibrillation who had been on Eliquis in the past but self DC'd due to leg hematoma, CAD with stenting maintained on aspirin, diastolic congestive heart failure, CKD stage III, hypertension, rheumatoid arthritis maintained on chronic prednisone per rheumatology services Dr. Jeanett Schlein, hyperlipidemia, seizure disorder maintained on Depakote followed by Dr. 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  Per chart review patient lives with spouse.  He is a retired Software engineer.  1 level home with ramped entrance.  Patient uses transfer board in and out of wheelchair he no longer uses his prosthesis due to skin issues.  Wife assist with bathing and dressing as well as pericare.  Presented 05/23/2020 with right side weakness left gaze preference and inability to speak.  Cranial CT scan negative for acute changes.  CT angiogram of head and neck negative for emergent large vessel occlusion.  Left MCA branches appeared stable from 2020 CTA.  Patient did receive TPA.  EEG negative for seizure but suggestive of mild to moderate diffuse encephalopathy.  MRI showed patchy abnormal diffusion at the left perirolandic cortex most resembling acute ischemia.  No substantial edema or mass-effect in that region.  Echocardiogram with ejection fraction of 55 to 60% no wall motion abnormalities.  Maintained on Pradaxa for CVA prophylaxis.  Admission chemistries unremarkable except glucose 153 BUN 36 creatinine 1.39 hemoglobin 10.5 valproic acid level 41.  Maintain on a dysphagia #1 nectar thick liquid diet.  Therapy evaluations completed due to patient decreased functional mobility was admitted for a comprehensive rehab program.  Review of Systems  Constitutional: Negative  for chills and fever.  HENT: Negative for hearing loss.   Eyes: Negative for blurred vision and double vision.  Respiratory: Negative for cough and shortness of breath.   Cardiovascular: Positive for palpitations and leg swelling.  Gastrointestinal: Positive for constipation. Negative for heartburn and nausea.       GERD  Genitourinary: Negative for flank pain and hematuria.       Incontinence of bowel and bladder  Musculoskeletal: Positive for joint pain and myalgias.  Skin: Negative for rash.  Neurological: Positive for dizziness, speech change, seizures and weakness.  Psychiatric/Behavioral: Positive for memory loss.  All other systems reviewed and are negative.  Past Medical History:  Diagnosis Date  . Abnormality of gait 09/22/2015  . Arthritis   . Atrial fibrillation ([place])   . CAD (coronary artery disease)    Stent to RCA, Penta stent, 99% reduced to 0% 2002.  . Cancer ([place])    skin CA removed from back  . 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 Chronic insomnia 03/30/2015  . Chronic kidney disease, stage 3 ([place])   . Complication of anesthesia    trouble waking up  . GERD (gastroesophageal reflux disease)   . Hypercholesteremia   . Hypertension   . Hypothyroidism   . Iron deficiency anemia   . Memory difficulty 09/22/2015  . Osteoarthritis   . PAD (peripheral artery disease) ([place])   . Pneumonia   . Seizures ([place])   . Sepsis ([place]) 05/2017  . Transient alteration of awareness 03/30/2015  . Vertigo    hx of  . Vitamin D deficiency    Past Surgical History:  Procedure Laterality  Date  . AMPUTATION Right 03/28/2018   Procedure: AMPUTATION BELOW KNEE;  Surgeon: Newt Minion, MD;  Location: [facility];  Service: Orthopedics;  Laterality: Right;  . APPLICATION OF WOUND VAC Right 01/23/2019   Procedure: Application Of  Prevena Wound Vac;  Surgeon: Newt Minion, MD;  Location: [facility];  Service: Orthopedics;  Laterality: Right;  . BACK SURGERY    .  BIOPSY  01/08/2020   Procedure: BIOPSY;  Surgeon: Gatha Mayer, MD;  Location: [facility];  Service: Endoscopy;;  . ESOPHAGOGASTRODUODENOSCOPY (EGD) WITH PROPOFOL N/A 01/08/2020   Procedure: ESOPHAGOGASTRODUODENOSCOPY (EGD) WITH PROPOFOL;  Surgeon: Gatha Mayer, MD;  Location: [facility];  Service: Endoscopy;  Laterality: N/A;  . EYE SURGERY     Bilateral Cataract surgery   . HERNIA REPAIR    . I & D EXTREMITY Right 05/10/2015   Procedure: IRRIGATION AND DEBRIDEMENT EXTREMITY;  Surgeon: Leanora Cover, MD;  Location: [facility];  Service: Orthopedics;  Laterality: Right;  . KNEE ARTHROPLASTY     right knee X 2; left knee once  . LAMINECTOMY     X 6  . LEG AMPUTATION BELOW KNEE Right 03/28/2018  . POSTERIOR CERVICAL FUSION/FORAMINOTOMY  01/28/2012   Procedure: POSTERIOR CERVICAL FUSION/FORAMINOTOMY LEVEL 3;  Surgeon: [name], MD;  Location: [facility];  Service: Neurosurgery;  Laterality: Left;  Posterior Cervical Five-Thoracic One Fusion, Arthrodesis with LEFT Cervical Seven-thoracic One Laminectomy, Foraminotomy and Resection of Synovial Cyst  . POSTERIOR CERVICAL FUSION/FORAMINOTOMY N/A 01/29/2013   Procedure: POSTERIOR CERVICAL FUSION/FORAMINOTOMY LEVEL 1 and C2-5 Posteriolateral Arthrodesis;  Surgeon: [name], MD;  Location: [facility];  Service: Neurosurgery;  Laterality: N/A;  C2-C3 Laminectomy C2-C3 posterior cervical arthrodesis  . STUMP REVISION Right 01/23/2019   Procedure: REVISION RIGHT BELOW KNEE AMPUTATION;  Surgeon: Newt Minion, MD;  Location: [facility];  Service: Orthopedics;  Laterality: Right;  . TONSILLECTOMY     Family History  Problem Relation Age of Onset  . Hypertension Mother   . Cancer Mother   . Kidney failure Father   . Heart disease Father    Social History:  reports that he has quit smoking. He has a 5.00 pack-year smoking history. He has never used smokeless tobacco. He reports previous alcohol use. He reports  that he does not use drugs. Allergies:  Allergies  Allergen Reactions  . Eliquis [Apixaban] Other (See Comments)    bleeding  . Demerol [Meperidine] Nausea And Vomiting  . Keppra [Levetiracetam] Other (See Comments)    Causes sleepiness, mental status changes  . Vimpat [Lacosamide] Other (See Comments)    Over sedated    Medications Prior to Admission  Medication Sig Dispense Refill  . alendronate (FOSAMAX) 70 MG tablet Take 70 mg by mouth every Wednesday. Remain upright for 30-60 minutes.    Marland Kitchen aspirin EC 81 MG tablet Take 81 mg by mouth daily.    . Calcium Carb-Cholecalciferol (CALCIUM 600+D) 600-800 MG-UNIT TABS Take 1 tablet by mouth daily.    Marland Kitchen diltiazem (CARDIZEM CD) 240 MG 24 hr capsule TAKE 1 CAPSULE BY MOUTH EVERY MORNING. HOLD IF SYSTOLIC BLOOD PRESSURE IS LESS THAN 100 (Patient taking differently: Take 240 mg by mouth daily. Hold if systolic blood pressure is less than 100.) 90 capsule 3  . divalproex (DEPAKOTE ER) 250 MG 24 hr tablet Take 250 mg by mouth in the morning and at bedtime.     . docusate sodium (COLACE) 100 MG capsule Take 100 mg  by mouth daily as needed for mild constipation.    Marland Kitchen doxazosin (CARDURA) 4 MG tablet TAKE 1 TABLET(4 MG) BY MOUTH AT BEDTIME (Patient taking differently: Take 4 mg by mouth at bedtime.) 90 tablet 3  . furosemide (LASIX) 20 MG tablet Take 1 tablet (20 mg total) by mouth daily. 90 tablet 3  . HYDROcodone-acetaminophen (NORCO) 10-325 MG tablet Take 1 tablet by mouth every 4 (four) hours as needed for pain.    Marland Kitchen levothyroxine (SYNTHROID, LEVOTHROID) 150 MCG tablet Take 150 mcg by mouth daily before breakfast.    . lidocaine (LIDODERM) 5 % Place 1-5 patches onto the skin daily. Remove & Discard patch within 12 hours or as directed by MD (lower back and shoulders)    . metoprolol tartrate (LOPRESSOR) 25 MG tablet TAKE 1 TABLET BY MOUTH TWICE DAILY WITH A MEAL OR IMMEDIATELY AFTER (Patient taking differently: Take 25 mg by mouth 2 (two) times daily.  WITH A MEAL OR IMMEDIATELY AFTER) 180 tablet 3  . Multiple Vitamins-Minerals (CERTAVITE/ANTIOXIDANTS) TABS Take 1 tablet by mouth every evening.     . nitrofurantoin (MACRODANTIN) 100 MG capsule Take 100 mg by mouth daily.    . nitroGLYCERIN (NITROSTAT) 0.4 MG SL tablet Place 1 tablet (0.4 mg total) under the tongue every 5 (five) minutes as needed for chest pain (call 911 afer 3 doses and if chest pain persists). 25 tablet 11  . polyethylene glycol (MIRALAX / GLYCOLAX) 17 g packet Take 17 g by mouth at bedtime.    . potassium chloride SA (KLOR-CON) 20 MEQ tablet Take 0.5 tablets (10 mEq total) by mouth daily. 45 tablet 3  . pravastatin (PRAVACHOL) 40 MG tablet Take 40 mg by mouth every evening.   2  . predniSONE (DELTASONE) 1 MG tablet Take 4 mg by mouth daily with breakfast.    . predniSONE (DELTASONE) 5 MG tablet Take 5 mg by mouth daily.    . mupirocin ointment (BACTROBAN) 2 % Apply 1 application topically 2 (two) times daily. Apply to the affected area 2 times a day (Patient not taking: Reported on 05/23/2020) 22 g 3  . pantoprazole (PROTONIX) 40 MG tablet Take 1 tablet (40 mg total) by mouth 2 (two) times daily. (Patient not taking: Reported on 05/23/2020) 60 tablet 0  . pentoxifylline (TRENTAL) 400 MG CR tablet TAKE 1 TABLET(400 MG) BY MOUTH THREE TIMES DAILY WITH MEALS (Patient not taking: Reported on 05/23/2020) 90 tablet 3  . sulfamethoxazole-trimethoprim (BACTRIM DS) 800-160 MG tablet Take 1 tablet by mouth 2 (two) times daily. (Patient not taking: No sig reported) 20 tablet 0  . sulfamethoxazole-trimethoprim (BACTRIM DS) 800-160 MG tablet Take 1 tablet by mouth 2 (two) times daily. (Patient not taking: No sig reported) 30 tablet 0    Drug Regimen Review Drug regimen was reviewed and remains appropriate with no significant issues identified  Home: Home Living Family/patient expects to be discharged to:: Private residence Living Arrangements: Spouse/significant other Available Help at  Discharge: Family,Available 24 hours/day Type of Home: House Home Access: Ramped entrance Home Layout: One level Bathroom Shower/Tub: Chiropodist: Handicapped height Bathroom Accessibility: Yes Home Equipment: Environmental consultant - 4 wheels,Cane - single point,Bedside commode,Shower seat,Wheelchair - manual,Grab bars - tub/shower,Transport chair,Hospital bed,Other (comment) (prosthetic) Additional Comments: transfer board  Lives With: Spouse   Functional History: Prior Function Level of Independence: Needs assistance Gait / Transfers Assistance Needed: pt uses transfer board for transfer in and out of w/c, does not don prosthesis anymore ADL's / [employer]  Needed: Pt's wife dresses, bathes, performs pericare for pt Comments: wife reports incontinence of stool is not abnormal for patient  Functional Status:  Mobility: Bed Mobility Overal bed mobility: Needs Assistance Bed Mobility: Rolling,Sidelying to Sit,Sit to Supine Rolling: Min assist,Mod assist Sidelying to sit: Mod assist,+2 for physical assistance Sit to supine: Mod assist,+2 for physical assistance General bed mobility comments: Min-modA for rolling bilaterally for truncal and LE translation and cues to reach across body for contralateral rail. Management of legs and trunk to transition to sit and back to supine L EOB with modAx2. Pt forgetting to manage R side towards EOB to square with EOB upon coming to sit, needing cues to attend to R side. Transfers Overall transfer level: Needs assistance Transfers: Lateral/Scoot Transfers  Lateral/Scoot Transfers: Max assist,+2 physical assistance,+2 safety/equipment General transfer comment: MaxAx2 with cues for L hand on L HOB rail and R hand on bed to push up to clear buttocks for lateral scoot to L, 3x. Pt not tolerating L knee block. Ambulation/Gait General Gait Details: unable    ADL: ADL Overall ADL's : Needs assistance/impaired Eating/Feeding: Maximal  assistance,Bed level Eating/Feeding Details (indicate cue type and reason): positioned for lunch tray with pillows in chair position General ADL Comments: total (A) for all adls at this time. pt would benefit from hoyer lift only due to risk for falls and skin break down  Cognition: Cognition Overall Cognitive Status: History of cognitive impairments - at baseline Arousal/Alertness: Awake/alert Orientation Level: Oriented X4 Attention: Sustained Sustained Attention: Appears intact Memory: Impaired Memory Impairment: Decreased recall of new information Awareness: Impaired Awareness Impairment: Anticipatory impairment Problem Solving: Impaired Problem Solving Impairment: Verbal basic Safety/Judgment: Impaired Cognition Arousal/Alertness: Awake/alert Behavior During Therapy: Flat affect Overall Cognitive Status: History of cognitive impairments - at baseline Area of Impairment: Following commands,Safety/judgement,Problem solving Following Commands: Follows one step commands with increased time Safety/Judgement: Decreased awareness of safety,Decreased awareness of deficits Problem Solving: Requires verbal cues,Requires tactile cues,Slow processing,Decreased initiation,Difficulty sequencing General Comments: Pt with poor awareness of deficits, displaying possible R side neglect as he needed reminders to attend to R side and look to R.  Physical Exam: Blood pressure (!) 158/90, pulse 85, temperature 98.7 F (37.1 C), temperature source Oral, resp. rate 17, height 5' 9.02" (1.753 m), weight 70.1 kg, SpO2 100 %. Physical Exam Skin:    Comments: Right BKA with dry dressing  Neurological:     Comments: Patient is alert.  Makes eye contact with examiner.  He does have an expressive receptive aphasia as well as some dysarthria.  He did have difficulty naming objects but was able to provide his name.  Follows some simple commands.     Results for orders placed or performed during the  hospital encounter of 05/23/20 (from the past 48 hour(s))  Hemoglobin A1c     Status: None   Collection Time: 05/24/20  7:15 AM  Result Value Ref Range   Hgb A1c MFr Bld 5.1 4.8 - 5.6 %    Comment: (NOTE) Pre diabetes:          5.7%-6.4%  Diabetes:              >6.4%  Glycemic control for   <7.0% adults with diabetes    Mean Plasma Glucose 99.67 mg/dL    Comment: Performed at [facility] [address], [place], [place] [address]  Lipid panel     Status: None   Collection Time: 05/24/20  7:15 AM  Result Value Ref Range  Cholesterol 124 0 - 200 mg/dL   Triglycerides 84 <150 mg/dL   HDL 56 >40 mg/dL   Total CHOL/HDL Ratio 2.2 RATIO   VLDL 17 0 - 40 mg/dL   LDL Cholesterol 51 0 - 99 mg/dL    Comment:        Total Cholesterol/HDL:CHD Risk Coronary Heart Disease Risk Table                     Men   Women  1/2 Average Risk   3.4   3.3  Average Risk       5.0   4.4  2 X Average Risk   9.6   7.1  3 X Average Risk  23.4   11.0        Use the calculated Patient Ratio above and the CHD Risk Table to determine the patient's CHD Risk.        ATP III CLASSIFICATION (LDL):  <100     mg/dL   Optimal  100-129  mg/dL   Near or Above                    Optimal  130-159  mg/dL   Borderline  160-189  mg/dL   High  >190     mg/dL   Very High Performed at [facility] [address] [place], [place] [address]    CT HEAD WO CONTRAST  Result Date: 05/24/2020 CLINICAL DATA:  Stroke follow-up. EXAM: CT HEAD WITHOUT CONTRAST TECHNIQUE: Contiguous axial images were obtained from the base of the skull through the vertex without intravenous contrast. COMPARISON:  May 23, 2020 FINDINGS: Brain: Findings concerning for left perirolandic acute ischemia were better characterized on prior MRI. No substantial edema or mass effect in this region. No evidence of interval/new acute large vascular territory infarct. No mass occupying hemorrhagic transformation. Similar generalized cerebral  volume loss with ex vacuo ventricular dilation and chronic microvascular ischemic disease. No evidence of a mass lesion or abnormal mass effect. Vascular: Calcific atherosclerosis. No hyperdense vessel identified. Skull: No acute fracture. Sinuses/Orbits: Clear sinuses.  Unremarkable orbits. Other: No mastoid effusions. IMPRESSION: Findings concerning for left perirolandic acute ischemia were better characterized on prior MRI. No substantial edema or mass effect in this region. No mass occupying hemorrhagic transformation. Electronically Signed   By: Margaretha Sheffield MD   On: 05/24/2020 11:40   MR BRAIN WO CONTRAST  Result Date: 05/25/2020 CLINICAL DATA:  Stroke follow-up EXAM: MRI HEAD WITHOUT CONTRAST TECHNIQUE: Multiplanar, multiecho pulse sequences of the brain and surrounding structures were obtained without intravenous contrast. COMPARISON:  CT head 05/24/2020.  MRI head 05/23/2020 FINDINGS: Brain: Restricted diffusion in the left perirolandic cortex compatible with acute infarct. Diffusion-weighted imaging is strongly positive and has progressed since the prior study. The distribution of infarct is unchanged from 2 days ago. No associated hemorrhage. There is now FLAIR hyperintensity in the cortex in the area of infarction. Moderate to advanced atrophy. Chronic microvascular ischemic changes in the white matter. No hemorrhage or mass lesion. Vascular: Normal arterial flow voids Skull and upper cervical spine: Anterior and posterior cervical spine fusion. Destructive process involving the dens with large amount of surrounding soft tissue. There is moderate spinal stenosis at the craniocervical junction with displacement of the cord and cord deformity. No interval change. Sinuses/Orbits: Paranasal sinuses clear. Bilateral cataract extraction Other: None IMPRESSION: Acute infarct in the left Perirolandic cortex. Diffusion-weighted imaging is more strongly positive on today's study than  the recent MRI. There  is now FLAIR hyperintensity in the cortex in this area. No associated hemorrhage Extensive atrophy. Chronic microvascular ischemic change in the white matter Destructive process of the dens with extensive surrounding soft tissue thickening posterior dens causing moderate spinal stenosis. Question rheumatoid arthritis, CPPD Electronically Signed   By: Franchot Gallo M.D.   On: 05/25/2020 11:36   EEG adult  Result Date: 05/24/2020 Lora Havens, MD     05/24/2020  8:47 AM Patient Name: TRYGVE THAL MRN: [id number] Epilepsy Attending: Lora Havens Referring Physician/Provider: Dr. Roland Rack Date: 05/23/2020 Duration: 23.19 mins Patient history: 87 yo male who presented as a code stroke due to aphasia, right sided weakness, left gaze preference.  EEG to evaluate for seizure. Level of alertness: Awake, asleep AEDs during EEG study: VPA Technical aspects: This EEG study was done with scalp electrodes positioned according to the 10-20 International system of electrode placement. Electrical activity was acquired at a sampling rate of 500Hz  and reviewed with a high frequency filter of 70Hz  and a low frequency filter of 1Hz . EEG data were recorded continuously and digitally stored. Description: The posterior dominant rhythm consists of 7.5 Hz activity of moderate voltage (25-35 uV) seen predominantly in posterior head regions, symmetric and reactive to eye opening and eye closing. Sleep was characterized by sleep spindles (12 to 14 Hz), maximal frontocentral region.  EEG also showed continuous generalized 3 to 7 Hz theta-delta slowing.  Hyperventilation and photic stimulation were not performed.   ABNORMALITY - Continuous slow, generalized IMPRESSION: This study is suggestive of mild to moderate diffuse encephalopathy, nonspecific etiology.  No seizures or epileptiform discharges were seen throughout the recording. Lora Havens       Medical Problem List and Plan: 1.  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  Antithrombotics: -DVT/anticoagulation: Pradaxa  -antiplatelet therapy: N/A 3. Pain Management: Hydrocodone as needed 4. Mood: Provide emotional support  -antipsychotic agents: N/A 5. Neuropsych: This patient is not capable of making decisions on his own behalf. 6. Skin/Wound Care: Routine skin checks 7. Fluids/Electrolytes/Nutrition: Routine in and outs with follow-up chemistries 8.  Dysphagia 1.  Dysphagia #1 nectar thick liquid diet follow-up speech therapy 9.  Atrial fibrillation.  Diltiazem 240 mg daily.  Cardiac rate controlled 10.  CAD with stenting.  Continue Pradaxa.  No chest pain or shortness of breath 11.  CKD stage III.  Baseline creatinine 1.39-1.60.  Follow-up chemistries 12.  Seizure disorder.  Depakote 500 mg twice daily 13.  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  Follow-up Dr. [name].  Wound care nurse follow-up.  Advise no foam dressing on stump.  Patient had decided he does not want to wear the stump sock shrinker 14.  Hypothyroidism.  Synthroid 15.  Hypertension.  Cardura 4 mg nightly.  Continue Lopressor 25 mg twice daily 16  GERD.  Protonix 17.  Hyperlipidemia.  Lipitor 18.  Diastolic congestive heart failure.  Lasix 20 mg daily.  Monitor for any signs of fluid overload 19.  Rheumatoid arthritis.  Continue chronic prednisone ***  Cathlyn Parsons, PA-C 05/26/2020

## 2020-05-26 NOTE — Progress Notes (Signed)
Patient received from 4N alert and orient. No distress noted at this time will continue monitor.

## 2020-05-26 NOTE — Progress Notes (Signed)
Inpatient Rehabilitation Admissions Coordinator  I met with patient , his wife and Dr. Leonie Man at bedside. We have a CIR bed to admit patient to today and they are in agreement. I will alert acute team and TOC and make the arrangements to admit today  Danne Baxter, RN, MSN Rehab Admissions Coordinator [phone] 05/26/2020 10:21 AM

## 2020-05-26 NOTE — Consult Note (Addendum)
WOC Nurse Consult:  Note requested for BKA topical treatment.  This consult was performed yesterday; refer to previous [facility] consult note by Val Riles.  Orders have been provided as follows: ONLY the wife will wash the right leg from the knee down, and she will use the lotion she has to moisturize the area. Elevate the leg on a pillow to prevent the stump from resting on the mattress. Do NOT place a foam dressing on the stump. The patient has decided he does NOT want to wear the stump sock shrinker provided by Dr. [name]. AND, MONITOR THE PINK SPOT ON THE KNEE AREA FOR of these develop.  15:30: Discussed plan of care with bedside nurse via phone call; pt's wife is concerned related to a new open draining area on the stump which was not present when the wound nurse assessed the site yesterday.  The patient's wife does not want a foam dressing applied and would like Dr [name] of the ortho service to come and view the location and provide dressing change orders, since the patient has been using his [place] stockings.  Secure chat message sent to the primary team to request a consult. Please re-consult if further assistance is needed.  Thank-you,  Julien Girt MSN, [place], [place], [place], [place]

## 2020-05-26 NOTE — Care Management Important Message (Signed)
Important Message  Patient Details  Name: [name] MRN: [id number] Date of Birth: [birth year]   Medicare Important Message Given:  Yes     Orbie Pyo 05/26/2020, 3:20 PM

## 2020-05-26 NOTE — Progress Notes (Signed)
Patient admitted to 60C07 for Rehab @1425 , accompanied by wife and son.  Patient A&O, and able to verbalize needs and wants appropriately.  Skin assessed with small pinhole size opening noted to right BKA stump, with noted serous drainage. Opening covered with foam dressing and Dawn (wound nurse) was notified per wife's request.  Also scattered bruising noted throughout BLE, due to thinning of skin.  Protective sleeves with be put in place.

## 2020-05-26 NOTE — Telephone Encounter (Signed)
Pt wife [name] called ad states her husband is back in the hospital and she is wondering if Bevely Palmer could give her a call? [phone]

## 2020-05-26 NOTE — Discharge Summary (Signed)
STROKE TEAM PROGRESS NOTE   INTERVAL HISTORY No acute events MRI done yesterday is more suggestive of left frontal MCA branch infarct is more clearly defined now.   Patient is reluctant to go to rehab but after my discussion with him is agreeable.  He has insurance approval transfer later today to inpatient rehab.  Vitals:   05/26/20 0400 05/26/20 0500 05/26/20 0600 05/26/20 0710  BP:    (!) 152/74  Pulse: 83 87 87 88  Resp:    18  Temp:    98.6 F (37 C)  TempSrc:    Oral  SpO2: 99% 96% 100% 98%  Weight:      Height:       CBC:  Recent Labs  Lab 05/23/20 0847 05/23/20 0852 05/26/20 0432  WBC 7.1  --  6.8  NEUTROABS 5.1  --   --   HGB 10.5* 10.2* 9.7*  HCT 33.5* 30.0* 30.3*  MCV 104.0*  --  103.4*  PLT 123*  --  323*   Basic Metabolic Panel:  Recent Labs  Lab 05/23/20 0847 05/23/20 0852 05/26/20 0432  NA 140 140 142  K 4.4 4.3 3.7  CL 106 104 110  CO2 23  --  21*  GLUCOSE 153* 152* 77  BUN 36* 38* 20  CREATININE 1.39* 1.40* 1.15  CALCIUM 8.3*  --  8.1*   Lipid Panel:  Recent Labs  Lab 05/24/20 0715  CHOL 124  TRIG 84  HDL 56  CHOLHDL 2.2  VLDL 17  LDLCALC 51   HgbA1c:  Recent Labs  Lab 05/24/20 0715  HGBA1C 5.1   Urine Drug Screen: No results for input(s): LABOPIA, COCAINSCRNUR, LABBENZ, AMPHETMU, THCU, LABBARB in the last 168 hours.  Alcohol Level No results for input(s): ETH in the last 168 hours.  IMAGING past 24 hours MR BRAIN WO CONTRAST  Result Date: 05/25/2020 CLINICAL DATA:  Stroke follow-up EXAM: MRI HEAD WITHOUT CONTRAST TECHNIQUE: Multiplanar, multiecho pulse sequences of the brain and surrounding structures were obtained without intravenous contrast. COMPARISON:  CT head 05/24/2020.  MRI head 05/23/2020 FINDINGS: Brain: Restricted diffusion in the left perirolandic cortex compatible with acute infarct. Diffusion-weighted imaging is strongly positive and has progressed since the prior study. The distribution of infarct is unchanged from  2 days ago. No associated hemorrhage. There is now FLAIR hyperintensity in the cortex in the area of infarction. Moderate to advanced atrophy. Chronic microvascular ischemic changes in the white matter. No hemorrhage or mass lesion. Vascular: Normal arterial flow voids Skull and upper cervical spine: Anterior and posterior cervical spine fusion. Destructive process involving the dens with large amount of surrounding soft tissue. There is moderate spinal stenosis at the craniocervical junction with displacement of the cord and cord deformity. No interval change. Sinuses/Orbits: Paranasal sinuses clear. Bilateral cataract extraction Other: None IMPRESSION: Acute infarct in the left Perirolandic cortex. Diffusion-weighted imaging is more strongly positive on today's study than the recent MRI. There is now FLAIR hyperintensity in the cortex in this area. No associated hemorrhage Extensive atrophy. Chronic microvascular ischemic change in the white matter Destructive process of the dens with extensive surrounding soft tissue thickening posterior dens causing moderate spinal stenosis. Question rheumatoid arthritis, CPPD Electronically Signed   By: Franchot Gallo M.D.   On: 05/25/2020 11:36   PHYSICAL EXAM Constitutional: Appears well-developed and well-nourished.  Psych: Affect appropriate to situation Eyes: No scleral injection HENT: No OP obstrucion Head: Normocephalic.  Cardiovascular: Normal rate and regular rhythm.  Respiratory: Effort normal and  breath sounds normal to anterior ascultation GI: Soft.  No distension. There is no tenderness.  Skin: WDI. Purplish discoloration to BUEs.   Neuro: Mental Status: Patient is awake, alert. Unable to answer orientation questions on first exam due to expressive aphasia. Patient is unable to give a clear and coherent history. Marland Kitchen Speech is not fluent. Can not name objects or repeat sentence. Comprehension is intact.  Cranial Nerves: II: Visual Fields are full.  Left gaze preference on arrival, later overcome. Pupils are equal, round, and reactive to light. No blink to threat originally on right.  III,IV, VI: EOMI without ptosis or diploplia.  V: Facial sensation is symmetric to temperature VII: Facial movement is symmetric.  VIII: hearing is intact to voice X: Uvula elevates symmetrically XI: Shoulder shrug is symmetric. XII: tongue is midline without atrophy or fasciculations.  Motor: Tone is normal. Bulk is normal. Strength right grip 4/5, right biceps 3+/5, right triceps 4/5. LLE/LUE 5/5.  Sensory: Sensation is symmetric to light touch and temperature in the arms and legs.  Plantars: Toes are downgoing bilaterally.  Cerebellar: FNF intact.  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 No LVO on imaging. + acute ischemia noted left perirolandic cortex on MRI. tPA was given. Patient's presenting symptoms beginning to resolve somewhat during and after tPA. NP alerted RN to left arm purplish hematoma at bedside post tPA. Symptoms not consistent with his seizure presentations in the past. Depakote level was low on admission.   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 No acute cortically based infarct or acute intracranial hemorrhage identified. ASPECTS 10. 2. Chronic white matter disease appears stable since 2020.  CTA head and neck  1. Negative for emergent large vessel occlusion. Left MCA branches appear stable from a 2020 CTA. This was reviewed in person with Dr. Roland Rack at [phone] hours. 2. Positive for chronic  arterial tortuosity and calcified plaque in the head and neck. 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 - Moderate Left Vertebral V4 segment. 3. Chronic very severe cervical spine degeneration. 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  MRI brain 1. Patchy abnormal diffusion at the left perirolandic cortex most resembling acute ischemia. No T2 FLAIR changes or hemorrhage, mass effect at this time. This was discussed in person with Dr. Roland Rack at [phone] hours. 2. Otherwise stable chronic small vessel disease since the 2020 MRI. 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  2D Echo EF 55-50%, severe LVH, Left atrial dilatation. No thrombus, wall motion abnormality or shunt found.   LDL 51  HgbA1c 5.1  VTE prophylaxis -     Diet   DIET - DYS 1 Room service appropriate? No; Fluid consistency: Nectar Thick    Patient stopped Eliquis for his afib in 2020 on his own.   Per discussion with pharmacist, Mina Marble, re: impaired renal function and dysphagia diet, Eliquis 5mg  BID is recommended dose. Ok to be crushed. He is just at the cut off for his elevated Ctn dosing. Monitor daily BMP.   Wound consult  for R amputation completed  Therapy recommendations: CIR  Disposition:  CIR pending approval and acceptance  Hypertension  Home meds:  Cardizem, lasix and metoprolol restarted   Stable . Permissive hypertension (OK if < 220/120) but gradually normalize in 5-7 days . Long-term BP goal normotensive  Hyperlipidemia  LDL 51, at goal < 70  High intensity statin: Lipitor 80mg  daily Continue statin at discharge  Dysphagia -SLP following -On dysphagia diet -Crush meds recommended -Dang, Rph, asked to review meds for crush appropriateness  CKD3 -baseline ctn approx 1.3 -Monitor BMP daily    CHF -Lasix restarted -Saline locked  -Monitor po intake  -Unable to obtain daily weights d/t amputation  Right LE amputation care -Wound care RN consulted and provided recs and ordered wound care  -Monitor pink area in the knee region  Other Stroke Risk Factors  Advanced Age >/= 89   Former Cigarette smoker: 5 pack year history  Coronary artery disease: Stent to RCA, Penta stent, 99% reduced to 0% 2002.  CHF, chronic diastolic   Other Active Problems    Hospital day # 3 .  Continue Pradaxa for stroke prevention as patient had a hematoma on Eliquis in the past.  Patient counseled to go to inpatient rehab so that he can get better soon and be safe at home.  Long discussion with patient and wife and rehab coordinator.  Mobilize out of bed.  Patient medically stable to be transferred to inpatient rehab later today.    Antony Contras, MD  To contact Stroke Continuity provider, please refer to [email or web address] After hours, contact General Neurology

## 2020-05-27 MED ORDER — DIVALPROEX SODIUM 125 MG PO CSDR
500.0000 mg | DELAYED_RELEASE_CAPSULE | Freq: Two times a day (BID) | ORAL | Status: DC
  Administered 2020-05-27 – 2020-05-29 (×4): 500 mg via ORAL
  Filled 2020-05-27 (×5): qty 4

## 2020-05-27 NOTE — Evaluation (Signed)
Physical Therapy Assessment and Plan  Patient Details  Name: [name] MRN: [id number] Date of Birth: 04-01-1933  PT Diagnosis: Abnormal posture, Cognitive deficits, Coordination disorder, Difficulty walking, Hemiparesis dominant, Impaired cognition, Impaired sensation and Muscle weakness Rehab Potential: Good ELOS: 2 weeks   Today's Date: 05/27/2020 PT Individual Time: 1000-1053 PT Individual Time Calculation (min): 53 min    Hospital Problem: Principal Problem:   Acute right hemiparesis (HCC) Active Problems:   Chronic diastolic (congestive) heart failure (HCC)   Anemia of chronic disease   Chronic atrial fibrillation   S/P BKA (below knee amputation) unilateral, right (HCC)   Stroke (cerebrum) (HCC)   Past Medical History:  Past Medical History:  Diagnosis Date  . Abnormality of gait 09/22/2015  . Arthritis   . Atrial fibrillation (HCC)   . CAD (coronary artery disease)    Stent to RCA, Penta stent, 99% reduced to 0% 2002.  . Cancer (HCC)    skin CA removed from back  . 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 Chronic insomnia 03/30/2015  . Chronic kidney disease, stage 3 (HCC)   . Complication of anesthesia    trouble waking up  . GERD (gastroesophageal reflux disease)   . Hypercholesteremia   . Hypertension   . Hypothyroidism   . Iron deficiency anemia   . Memory difficulty 09/22/2015  . Osteoarthritis   . PAD (peripheral artery disease) (HCC)   . Pneumonia   . Seizures (HCC)   . Sepsis (HCC) 05/2017  . Transient alteration of awareness 03/30/2015  . Vertigo    hx of  . Vitamin D deficiency    Past Surgical History:  Past Surgical History:  Procedure Laterality Date  . AMPUTATION Right 03/28/2018   Procedure: AMPUTATION BELOW KNEE;  Surgeon: Nadara Mustard, MD;  Location: [facility] OR;  Service: Orthopedics;  Laterality: Right;  . APPLICATION OF WOUND VAC Right 01/23/2019   Procedure: Application Of  Prevena Wound Vac;  Surgeon: Nadara Mustard, MD;  Location: [facility] OR;  Service: Orthopedics;  Laterality: Right;  . BACK SURGERY    . BIOPSY  01/08/2020   Procedure: BIOPSY;  Surgeon: Iva Boop, MD;  Location: [facility] ENDOSCOPY;  Service: Endoscopy;;  . ESOPHAGOGASTRODUODENOSCOPY (EGD) WITH PROPOFOL N/A 01/08/2020   Procedure: ESOPHAGOGASTRODUODENOSCOPY (EGD) WITH PROPOFOL;  Surgeon: Iva Boop, MD;  Location: [facility] ENDOSCOPY;  Service: Endoscopy;  Laterality: N/A;  . EYE SURGERY     Bilateral Cataract surgery   . HERNIA REPAIR    . I & D EXTREMITY Right 05/10/2015   Procedure: IRRIGATION AND DEBRIDEMENT EXTREMITY;  Surgeon: Betha Loa, MD;  Location: [facility];  Service: Orthopedics;  Laterality: Right;  . KNEE ARTHROPLASTY     right knee X 2; left knee once  . LAMINECTOMY     X 6  . LEG AMPUTATION BELOW KNEE Right 03/28/2018  . POSTERIOR CERVICAL FUSION/FORAMINOTOMY  01/28/2012   Procedure: POSTERIOR CERVICAL FUSION/FORAMINOTOMY LEVEL 3;  Surgeon: [name], MD;  Location: [facility] NEURO ORS;  Service: Neurosurgery;  Laterality: Left;  Posterior Cervical Five-Thoracic One Fusion, Arthrodesis with LEFT Cervical Seven-thoracic One Laminectomy, Foraminotomy and Resection of Synovial Cyst  . POSTERIOR CERVICAL FUSION/FORAMINOTOMY N/A 01/29/2013   Procedure: POSTERIOR CERVICAL FUSION/FORAMINOTOMY LEVEL 1 and C2-5 Posteriolateral Arthrodesis;  Surgeon: [name], MD;  Location: [facility] ORS;  Service: Neurosurgery;  Laterality: N/A;  C2-C3 Laminectomy C2-C3 posterior cervical arthrodesis  . STUMP REVISION Right 01/23/2019   Procedure:  REVISION RIGHT BELOW KNEE AMPUTATION;  Surgeon: Newt Minion, MD;  Location: [place];  Service: Orthopedics;  Laterality: Right;  . TONSILLECTOMY      Assessment & Plan Clinical Impression: Patient is a 87 y.o. year old male with history of atrial fibrillation who had been on Eliquis in the past but self DC 'd due to leg hematoma, CAD with stenting maintained on aspirin,  diastolic congestive heart failure, CKD stage III, hypertension, hyperlipidemia, seizure disorder maintained on Depakote followed by Dr. 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    Patient uses transfer board in and out of wheelchair he no longer uses his prosthesis due to skin issues.  Wife assist with bathing and dressing as well as peri care.  Presented 05/23/2020 with right side weakness left gaze preference and inability to speak.  Cranial CT scan negative for acute changes.  CT angiogram of head and neck negative for emergent large vessel occlusion.  Left MCA branches appeared stable from 2020 CTA.  Patient did receive TPA.  EEG negative for seizure but suggestive of mild to moderate diffuse encephalopathy.  MRI showed patchy abnormal diffusion at the left perirolandic cortex most resembling acute ischemia.  No substantial edema or mass-effect in that region.  Echocardiogram with ejection fraction of 55 to 60% no wall motion abnormalities.  Maintained on Pradaxa for CVA prophylaxis.  Admission chemistries unremarkable except glucose 153 BUN 36 creatinine 1.39 hemoglobin 10.5 valproic acid level 41.  Maintain on a dysphagia #1 nectar thick liquid diet.     Patient currently requires max/ total A with mobility secondary to muscle weakness, decreased cardiorespiratoy endurance, unbalanced muscle activation, decreased coordination and decreased motor planning, right side neglect and decreased motor planning, decreased initiation, decreased attention, decreased awareness, decreased problem solving, decreased safety awareness and decreased memory and decreased sitting balance, decreased standing balance, decreased postural control, hemiplegia and decreased balance strategies.  Prior to hospitalization, patient was CGA/ min with mobility and lived with Spouse in a House home.  Home access is  Ramped entrance.  Patient will benefit from skilled  PT intervention to maximize safe functional mobility, minimize fall risk and decrease caregiver burden for planned discharge home with 24 hour assist.  Anticipate patient will benefit from follow up [facility] at discharge.  PT - End of Session Activity Tolerance: Tolerates 30+ min activity with multiple rests Endurance Deficit: Yes Endurance Deficit Description: requires increased time for all tasks, falling asleep throughout session PT Assessment Rehab Potential (ACUTE/IP ONLY): Good PT Barriers to Discharge: Wound Care PT Barriers to Discharge Comments: wounds on R residual limb and poor activity tolerance PT Patient demonstrates impairments in the following area(s): Balance;Endurance;Motor;Perception;Safety;Sensory;Skin Integrity PT Transfers Functional Problem(s): Bed Mobility;Bed to Chair;Car;Furniture PT Locomotion Functional Problem(s): Ambulation;Wheelchair Mobility;Stairs PT Plan PT Intensity: Minimum of 1-2 x/day ,45 to 90 minutes PT Frequency: 5 out of 7 days PT Duration Estimated Length of Stay: 2 weeks PT Treatment/Interventions: Ambulation/gait training;Discharge planning;Functional mobility training;Psychosocial support;Therapeutic Activities;Visual/perceptual remediation/compensation;Balance/vestibular training;Disease management/prevention;Neuromuscular re-education;Skin care/wound management;Therapeutic Exercise;Wheelchair propulsion/positioning;Cognitive remediation/compensation;DME/adaptive equipment instruction;Pain management;Splinting/orthotics;UE/LE Strength taining/ROM;Community reintegration;Patient/family education;Stair training;UE/LE Coordination activities PT Transfers Anticipated Outcome(s): CGA with LRAD PT Locomotion Anticipated Outcome(s): N/A PT Recommendation Follow Up Recommendations: Home health PT Patient destination: Home Equipment Recommended: To be determined Equipment Details: has slideboard and WC   PT  Evaluation Precautions/Restrictions Precautions Precautions: Fall Precaution Comments: very thin skin, skin tears easily with multiple sites of skin breakdown; R neglect Restrictions Weight  Bearing Restrictions: No Home Living/Prior Functioning Home Living Living Arrangements: Spouse/significant other Available Help at Discharge: Family;Available 24 hours/day Type of Home: House Home Access: Ramped entrance Home Layout: One level Bathroom Shower/Tub: Health visitor: Handicapped height Bathroom Accessibility: Yes Additional Comments: used slide board for transfers  Lives With: Spouse Prior Function Level of Independence: Requires assistive device for independence;Needs assistance with ADLs;Needs assistance with tranfers Bath: Maximal Toileting: Total Dressing: Maximal Grooming: Supervision/set-up  Able to Take Stairs?: No Driving: No Vocation: Retired Tree surgeon Status: History of cognitive impairments - at baseline Arousal/Alertness: Awake/alert Orientation Level: Oriented to person Memory: Impaired Awareness: Impaired Problem Solving: Impaired Safety/Judgment: Impaired Sensation Sensation Light Touch: Impaired by gross assessment Proprioception: Impaired by gross assessment Coordination Gross Motor Movements are Fluid and Coordinated: No Fine Motor Movements are Fluid and Coordinated: No Coordination and Movement Description: grossly uncoordinated due to fatigue, generalized weakness, R BKA, R hemiparesis, decreased balance/postural control, and decreased endurance. Finger Nose Finger Test: decreased bilaterally and difficulty staying awake. Motor  Motor Motor: Hemiplegia;Abnormal postural alignment and control Motor - Skilled Clinical Observations: grossly uncoordinated due to fatigue, generalized weakness, R BKA, R hemiparesis, decreased balance/postural control, and decreased endurance.  Trunk/Postural Assessment  Cervical  Assessment Cervical Assessment: Exceptions to [facility] (forward head) Thoracic Assessment Thoracic Assessment: Exceptions to [facility]) Lumbar Assessment Lumbar Assessment: Exceptions to [facility] (posterior pelvic tilt) Postural Control Postural Control: Deficits on evaluation  Balance Balance Balance Assessed: Yes Static Sitting Balance Static Sitting - Balance Support: Feet supported;Bilateral upper extremity supported Static Sitting - Level of Assistance: 5: Stand by assistance (CGA) Dynamic Sitting Balance Dynamic Sitting - Balance Support: Feet supported;Bilateral upper extremity supported Dynamic Sitting - Level of Assistance: 4: Min assist Extremity Assessment  RLE Assessment RLE Assessment: Exceptions to [facility] General Strength Comments: grossly generalized to 3+/5 (hip flexion, abd/add, and knee flexion) LLE Assessment LLE Assessment: Exceptions to [facility] General Strength Comments: grossly generalized to 3+/5  Care Tool Care Tool Bed Mobility Roll left and right activity   Roll left and right assist level: Maximal Assistance - Patient 25 - 49% Roll left and right assistive device comment: bedrails  Sit to lying activity   Sit to lying assist level: Minimal Assistance - Patient > 75%    Lying to sitting edge of bed activity   Lying to sitting edge of bed assist level: Maximal Assistance - Patient 25 - 49%     Care Tool Transfers Sit to stand transfer Sit to stand activity did not occur: Safety/medical concerns (fatigue, generalized weakness, decreased balance)      Chair/bed transfer   Chair/bed transfer assist level: 2 Chief Strategy Officer transfer activity did not occur: Safety/medical Haematologist transfer activity did not occur: Safety/medical concerns (fatigue, generalized weakness, decreased balance)        Care Tool Locomotion Ambulation Ambulation activity did not occur: Safety/medical concerns (fatigue, generalized weakness,  decreased balance)        Walk 10 feet activity Walk 10 feet activity did not occur: Safety/medical concerns (fatigue, generalized weakness, decreased balance)       Walk 50 feet with 2 turns activity Walk 50 feet with 2 turns activity did not occur: Safety/medical concerns (fatigue, generalized weakness, decreased balance)      Walk 150 feet activity Walk 150 feet activity did not occur: Safety/medical concerns (fatigue, generalized weakness, decreased balance)      Walk 10 feet on uneven  surfaces activity Walk 10 feet on uneven surfaces activity did not occur: Safety/medical concerns (fatigue, generalized weakness, decreased balance)      Stairs Stair activity did not occur: Safety/medical concerns (fatigue, generalized weakness, decreased balance)        Walk up/down 1 step activity Walk up/down 1 step or curb (drop down) activity did not occur: Safety/medical concerns (fatigue, generalized weakness, decreased balance)     Walk up/down 4 steps activity did not occuR: Safety/medical concerns (fatigue, generalized weakness, decreased balance)  Walk up/down 4 steps activity      Walk up/down 12 steps activity Walk up/down 12 steps activity did not occur: Safety/medical concerns (fatigue, generalized weakness, decreased balance)      Pick up small objects from floor Pick up small object from the floor (from standing position) activity did not occur: Safety/medical concerns (fatigue, generalized weakness, decreased balance)      Wheelchair Will patient use wheelchair at discharge?: Yes Type of Wheelchair: Manual   Wheelchair assist level: Supervision/Verbal cueing Max wheelchair distance: 32ft  Wheel 50 feet with 2 turns activity   Assist Level: Total Assistance - Patient < 25%  Wheel 150 feet activity   Assist Level: Dependent - Patient 0%    Refer to Care Plan for Long Term Goals  SHORT TERM GOAL WEEK 1 PT Short Term Goal 1 (Week 1): pt will roll L and R with min A PT  Short Term Goal 2 (Week 1): pt will transfer bed<>WC with LRAD and mod A of 1 PT Short Term Goal 3 (Week 1): pt will perform WC mobility 66ft with supervision  Recommendations for other services: None   Skilled Therapeutic Intervention Evaluation completed (see details above and below) with education on PT POC and goals and individual treatment initiated with focus on functional mobility/transfers, generalized strengthening, dynamic sitting balance/coordination, and improved activity tolerance. Received pt supine in bed asleep with wife present at bedside, pt educated on PT evaluation, CIR policies, and therapy schedule and agreeable. Pt kept eyes closed during ~80% of session. Pt denied any pain during session. Pt rolled R with max A and L with mod A using bedrails. Pt transferred L sidelying<>sitting EOB with max A for trunk control and LLE management. Pt required mod A to scoot to EOB and doffed dirty gown and donned clean pull over shirt with max A and L shoe with total A. Pt transferred bed<>WC via lateral scoot with mod A +2 and cues for hand placement, anterior weight shifting, and to get LLE back. Pt transported to 4W therapy gym in [facility] total A to obtain slideboard and therapist provided pt with R amputee support pad. Pt performed WC mobility 84ft using LLE and BUE with supervision and cues for propulsion technique. Pt visibly fatigued at end of session but denied fatigue. Pt transferred WC<>bed via slideboard with max A of 1 with pt's wife stabilizing WC. Sit<>supine with min A for LLE management. Concluded session with pt supine in bed asleep, needs within reach, and bed alarm on. Wife present at bedside and safety plan updated.   Mobility Bed Mobility Bed Mobility: Rolling Right;Rolling Left;Left Sidelying to Sit;Sit to Supine Rolling Right: Maximal Assistance - Patient 25-49% Rolling Left: Moderate Assistance - Patient 50-74% Left Sidelying to Sit: Maximal Assistance - Patient 25-49% Sit to  Supine: Minimal Assistance - Patient > 75% Transfers Transfers: Lateral/Scoot Transfers Lateral/Scoot Transfers: 2 Press photographer (Assistive device): None Locomotion  Gait Ambulation: No Gait Gait: No Stairs / Additional Locomotion Stairs: No  Architect: Yes Wheelchair Assistance: Chartered loss adjuster: Both upper extremities;Left lower extremity Wheelchair Parts Management: Needs assistance Distance: 66ft   Discharge Criteria: Patient will be discharged from PT if patient refuses treatment 3 consecutive times without medical reason, if treatment goals not met, if there is a change in medical status, if patient makes no progress towards goals or if patient is discharged from hospital.  The above assessment, treatment plan, treatment alternatives and goals were discussed and mutually agreed upon: by patient and by family  Alfonse Alpers PT, DPT  05/27/2020, 12:16 PM

## 2020-05-27 NOTE — Discharge Instructions (Signed)
Inpatient Rehab Discharge Instructions  [name] Discharge date and time: No discharge date for patient encounter.   Activities/Precautions/ Functional Status: Activity: activity as tolerated Diet:  Wound Care: Routine skin checks Functional status:  ___ No restrictions     ___ Walk up steps independently ___ 24/7 supervision/assistance   ___ Walk up steps with assistance ___ Intermittent supervision/assistance  ___ Bathe/dress independently ___ Walk with walker     _x__ Bathe/dress with assistance ___ Walk Independently    ___ Shower independently ___ Walk with assistance    ___ Shower with assistance ___ No alcohol     ___ Return to work/school ________  Special Instructions: No driving smoking or alcohol STROKE/TIA DISCHARGE INSTRUCTIONS SMOKING Cigarette smoking nearly doubles your risk of having a stroke & is the single most alterable risk factor  If you smoke or have smoked in the last 12 months, you are advised to quit smoking for your health.  Most of the excess cardiovascular risk related to smoking disappears within a year of stopping.  Ask you doctor about anti-smoking medications  [place] Quit Line: 1-800-QUIT NOW  Free Smoking Cessation Classes [phone]  CHOLESTEROL Know your levels; limit fat & cholesterol in your diet  Lipid Panel     Component Value Date/Time   CHOL 124 05/24/2020 0715   CHOL 121 10/16/2019 1220   TRIG 84 05/24/2020 0715   HDL 56 05/24/2020 0715   HDL 58 10/16/2019 1220   CHOLHDL 2.2 05/24/2020 0715   VLDL 17 05/24/2020 0715   LDLCALC 51 05/24/2020 0715   LDLCALC 44 10/16/2019 1220      Many patients benefit from treatment even if their cholesterol is at goal.  Goal: Total Cholesterol (CHOL) less than 160  Goal:  Triglycerides (TRIG) less than 150  Goal:  HDL greater than 40  Goal:  LDL (LDLCALC) less than 100   BLOOD PRESSURE American Stroke Association blood pressure target is less that 120/80 mm/Hg  Your discharge  blood pressure is:     Monitor your blood pressure  Limit your salt and alcohol intake  Many individuals will require more than one medication for high blood pressure  DIABETES (A1c is a blood sugar average for last 3 months) Goal HGBA1c is under 7% (HBGA1c is blood sugar average for last 3 months)  Diabetes: No known diagnosis of diabetes    Lab Results  Component Value Date   HGBA1C 5.1 05/24/2020     Your HGBA1c can be lowered with medications, healthy diet, and exercise.  Check your blood sugar as directed by your physician  Call your physician if you experience unexplained or low blood sugars.  PHYSICAL ACTIVITY/REHABILITATION Goal is 30 minutes at least 4 days per week  Activity: Increase activity slowly, Therapies: Physical Therapy: Home Health Return to work:   Activity decreases your risk of heart attack and stroke and makes your heart stronger.  It helps control your weight and blood pressure; helps you relax and can improve your mood.  Participate in a regular exercise program.  Talk with your doctor about the best form of exercise for you (dancing, walking, swimming, cycling).  DIET/WEIGHT Goal is to maintain a healthy weight  Your discharge diet is:  Diet Order    None      liquids Your height is:    Your current weight is:   Your Body Mass Index (BMI) is:     Following the type of diet specifically designed for you will help prevent  another stroke.  Your goal weight range is:    Your goal Body Mass Index (BMI) is 19-24.  Healthy food habits can help reduce 3 risk factors for stroke:  High cholesterol, hypertension, and excess weight.  RESOURCES Stroke/Support Group:  Call [phone]   STROKE EDUCATION PROVIDED/REVIEWED AND GIVEN TO PATIENT Stroke warning signs and symptoms How to activate emergency medical system (call 911). Medications prescribed at discharge. Need for follow-up after discharge. Personal risk factors for stroke. Pneumonia vaccine  given:  Flu vaccine given:  My questions have been answered, the writing is legible, and I understand these instructions.  I will adhere to these goals & educational materials that have been provided to me after my discharge from the hospital.      My questions have been answered and I understand these instructions. I will adhere to these goals and the provided educational materials after my discharge from the hospital.  Patient/Caregiver Signature _______________________________ Date __________  Clinician Signature _______________________________________ Date __________  Please bring this form and your medication list with you to all your follow-up doctor's appointments.    Information on my medicine - Pradaxa (dabigatran)  This medication education was reviewed with me or my healthcare representative as part of my discharge preparation.    Why was Pradaxa prescribed for you? Pradaxa was prescribed for you to reduce the risk of forming blood clots that cause a stroke if you have a medical condition called atrial fibrillation (a type of irregular heartbeat).    What do you Need to know about PradAXa? Take your Pradaxa TWICE DAILY - one capsule in the morning and one tablet in the evening with or without food.  It would be best to take the doses about the same time each day.  The capsules should not be broken, chewed or opened - they must be swallowed whole.  Do not store Pradaxa in other medication containers - once the bottle is opened the Pradaxa should be used within FOUR months; throw away any capsules that haven't been by that time.  Take Pradaxa exactly as prescribed by your doctor.  DO NOT stop taking Pradaxa without talking to the doctor who prescribed the medication.  Stopping without other stroke prevention medication to take the place of Pradaxa may increase your risk of developing a clot that causes a stroke.  Refill your prescription before you run out.  After  discharge, you should have regular check-up appointments with your healthcare provider that is prescribing your Pradaxa.  In the future your dose may need to be changed if your kidney function or weight changes by a significant amount.  What do you do if you miss a dose? If you miss a dose, take it as soon as you remember on the same day.  If your next dose is less than 6 hours away, skip the missed dose.  Do not take two doses of PRADAXA at the same time.  Important Safety Information A possible side effect of Pradaxa is bleeding. You should call your healthcare provider right away if you experience any of the following: ? Bleeding from an injury or your nose that does not stop. ? Unusual colored urine (red or dark brown) or unusual colored stools (red or black). ? Unusual bruising for unknown reasons. ? A serious fall or if you hit your head (even if there is no bleeding).  Some medicines may interact with Pradaxa and might increase your risk of bleeding or clotting while on Pradaxa. To help avoid  this, consult your healthcare provider or pharmacist prior to using any new prescription or non-prescription medications, including herbals, vitamins, non-steroidal anti-inflammatory drugs (NSAIDs) and supplements.  This website has more information on Pradaxa (dabigatran): [email or web address]://[email or web address].com

## 2020-05-27 NOTE — Plan of Care (Signed)
  Problem: Consults Goal: RH STROKE PATIENT EDUCATION Description: See Patient Education module for education specifics  Outcome: Progressing   Problem: RH BOWEL ELIMINATION Goal: RH STG MANAGE BOWEL WITH ASSISTANCE Description: STG Manage Bowel with [place]. Outcome: Progressing Goal: RH STG MANAGE BOWEL W/MEDICATION W/ASSISTANCE Description: STG Manage Bowel with Medication with min Assistance. Outcome: Progressing   Problem: RH BLADDER ELIMINATION Goal: RH STG MANAGE BLADDER WITH ASSISTANCE Description: STG Manage Bladder With min Assistance Outcome: Progressing Goal: RH STG MANAGE BLADDER WITH MEDICATION WITH ASSISTANCE Description: STG Manage Bladder With Medication With min Assistance. Outcome: Progressing   Problem: RH SKIN INTEGRITY Goal: RH STG MAINTAIN SKIN INTEGRITY WITH ASSISTANCE Description: STG Maintain Skin Integrity With min Assistance. Outcome: Progressing Goal: RH STG ABLE TO PERFORM INCISION/WOUND CARE W/ASSISTANCE Description: STG Able To Perform Incision/Wound Care With min Assistance. Outcome: Progressing   Problem: RH SAFETY Goal: RH STG ADHERE TO SAFETY PRECAUTIONS W/ASSISTANCE/DEVICE Description: STG Adhere to Safety Precautions With min Assistance/Device. Outcome: Progressing Goal: RH STG DECREASED RISK OF FALL WITH ASSISTANCE Description: STG Decreased Risk of Fall With min Assistance. Outcome: Progressing   Problem: RH PAIN MANAGEMENT Goal: RH STG PAIN MANAGED AT OR BELOW PT'S PAIN GOAL Description: < 4 on a 0-10 pain scale. Outcome: Progressing   Problem: RH KNOWLEDGE DEFICIT Goal: RH STG INCREASE KNOWLEDGE OF STROKE PROPHYLAXIS Description: Patient will demonstrate knowledge of medication management, pain management, skin/wound care, residual limb care, weight bearing precautions with educational materials and handouts provided by staff. Outcome: Progressing

## 2020-05-27 NOTE — Consult Note (Addendum)
WOC follow-up:  Dr [name] of the ortho service is following for assessment and plan of care for pt's BKA; Ortho PA in this AM to assess the location and provide topical treatment recommendations, as the patient's wife requested yesterday.  Please refer to their team for any further questions regarding plan of care for the stump.  Please re-consult if further assistance is needed.  Thank-you,  Julien Girt MSN, [place], [place], [place], [place]

## 2020-05-27 NOTE — Progress Notes (Signed)
Pt prescribed Depakote 500 ER tablet BID, but takes meds crushed in puree due to Dysphagia/swallowing difficulty. On call contacted and gave verbal order to change to Depakote Sprinkles 500 mg, BID.

## 2020-05-27 NOTE — Progress Notes (Addendum)
PROGRESS NOTE   Subjective/Complaints: Patient has no complaints this morning Wife at bedside is very attentive. Last night she called Dr. Jess Barters team given her concerns regarding oozing from former R BKA- was evaluated by PA this morning and she is reassured.   ROS: +bruising in bilateral arms  Objective:   MR BRAIN WO CONTRAST  Result Date: 05/25/2020 CLINICAL DATA:  Stroke follow-up EXAM: MRI HEAD WITHOUT CONTRAST TECHNIQUE: Multiplanar, multiecho pulse sequences of the brain and surrounding structures were obtained without intravenous contrast. COMPARISON:  CT head 05/24/2020.  MRI head 05/23/2020 FINDINGS: Brain: Restricted diffusion in the left perirolandic cortex compatible with acute infarct. Diffusion-weighted imaging is strongly positive and has progressed since the prior study. The distribution of infarct is unchanged from 2 days ago. No associated hemorrhage. There is now FLAIR hyperintensity in the cortex in the area of infarction. Moderate to advanced atrophy. Chronic microvascular ischemic changes in the white matter. No hemorrhage or mass lesion. Vascular: Normal arterial flow voids Skull and upper cervical spine: Anterior and posterior cervical spine fusion. Destructive process involving the dens with large amount of surrounding soft tissue. There is moderate spinal stenosis at the craniocervical junction with displacement of the cord and cord deformity. No interval change. Sinuses/Orbits: Paranasal sinuses clear. Bilateral cataract extraction Other: None IMPRESSION: Acute infarct in the left Perirolandic cortex. Diffusion-weighted imaging is more strongly positive on today's study than the recent MRI. There is now FLAIR hyperintensity in the cortex in this area. No associated hemorrhage Extensive atrophy. Chronic microvascular ischemic change in the white matter Destructive process of the dens with extensive surrounding soft  tissue thickening posterior dens causing moderate spinal stenosis. Question rheumatoid arthritis, CPPD Electronically Signed   By: Franchot Gallo M.D.   On: 05/25/2020 11:36   Recent Labs    05/26/20 0432  WBC 6.8  HGB 9.7*  HCT 30.3*  PLT 127*   Recent Labs    05/26/20 0432  NA 142  K 3.7  CL 110  CO2 21*  GLUCOSE 77  BUN 20  CREATININE 1.15  CALCIUM 8.1*    Intake/Output Summary (Last 24 hours) at 05/27/2020 1700 Last data filed at 05/27/2020 0403 Gross per 24 hour  Intake --  Output 1000 ml  Net -1000 ml        Physical Exam: Vital Signs Blood pressure 129/60, pulse 85, temperature 98.1 F (36.7 C), temperature source Oral, resp. rate 16, height 5\' 9"  (1.753 m), weight 65.2 kg, SpO2 100 %. Gen: no distress, normal appearing HEENT: oral mucosa pink and moist, NCAT Cardio: Reg rate Chest: normal effort, normal rate of breathing Abd: soft, non-distended Ext: no edema Psych: pleasant, normal affect Skin: intact Neuro: Alert and oriented x3. Musculoskeletal:     Cervical back: Normal range of motion and neck supple.     Comments: RUE- 3/5 in proximal muscles- 3+/5 distally LUE- 4+/5 in same muscles tested RLE- 3/5 in HF/KE/KF- has R BKA LLE- 4+/5 in LLE- in HF/KE/DF and PF  R BKA- in shrinker- with mepilex underneath it  Skin:    Comments: Right BKA with dry dressing  L AC fossa required significant pressure to stop active bleeding on  bed, soaked dressing/gown and bedding Ecchymoses on B/L UEs and less so on LEs Large R chest purple bruise and B/L UEs- from wrist to shoulder No breakdown on buttocks  Neurological:     Comments: Patient is alert.  Makes eye contact with examiner.  He does have an expressive receptive aphasia as well as some dysarthria.  He did have difficulty naming objects but was able to provide his name.  Follows some simple commands. Pt has some expressive aphasia- his speech appears fluent initially until asked to name objects- decreased  amount and also took additional time- word finding deficits- mild Intact to light touch in UEs and LLE/R BKA  Psychiatric:     Comments: Flat, quiet affect     Assessment/Plan: 1. Functional deficits which require 3+ hours per day of interdisciplinary therapy in a comprehensive inpatient rehab setting.  Physiatrist is providing close team supervision and 24 hour management of active medical problems listed below.  Physiatrist and rehab team continue to assess barriers to discharge/monitor patient progress toward functional and medical goals  Care Tool:  Bathing              Bathing assist Assist Level: Maximal Assistance - Patient 24 - 49%     Upper Body Dressing/Undressing Upper body dressing        Upper body assist Assist Level: Maximal Assistance - Patient 25 - 49%    Lower Body Dressing/Undressing Lower body dressing            Lower body assist       Toileting Toileting    Toileting assist       Transfers Chair/bed transfer  Transfers assist           Locomotion Ambulation   Ambulation assist              Walk 10 feet activity   Assist           Walk 50 feet activity   Assist           Walk 150 feet activity   Assist           Walk 10 feet on uneven surface  activity   Assist           Wheelchair     Assist               Wheelchair 50 feet with 2 turns activity    Assist            Wheelchair 150 feet activity     Assist          Blood pressure 129/60, pulse 85, temperature 98.1 F (36.7 C), temperature source Oral, resp. rate 16, height 5\' 9"  (1.753 m), weight 65.2 kg, SpO2 100 %.    Medical Problem List and Plan: 1.  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  Antithrombotics: -DVT/anticoagulation: Continue Pradaxa-discussed cost with wife and they will be able to afford.              -antiplatelet therapy: N/A 3. Pain Management: Continue Hydrocodone as needed- requiring daily 4. Mood: Provide emotional support             -  antipsychotic agents: N/A 5. Neuropsych: This patient is not capable of making decisions on his own behalf. 6. Skin/Wound Care: Routine skin checks 7. Fluids/Electrolytes/Nutrition: Routine in and outs with follow-up chemistries 8.  Dysphagia 1.  Dysphagia #1 nectar thick liquid diet follow-up speech therapy 9.  Atrial fibrillation.  Diltiazem 240 mg daily.  Cardiac rate controlled 10.  CAD with stenting.  Continue Pradaxa.  No chest pain or shortness of breath 11.  CKD stage III.  Baseline creatinine 1.39-1.60.  Follow-up chemistries 12.  Seizure disorder.  Depakote 500 mg twice daily 13.  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  Follow-up Dr. [name].  Wound care nurse follow-up.  Advise no foam dressing on stump.  Continue shrinker 14.  Hypothyroidism.  Synthroid 15.  Hypertension.  Cardura 4 mg nightly.  Continue Lopressor 25 mg twice daily 16  GERD.  Protonix 17.  Hyperlipidemia.  Lipitor 18.  Diastolic congestive heart failure.  Lasix 20 mg daily.  Monitor for any signs of fluid overload 19.  Rheumatoid arthritis.  Continue chronic prednisone 20. L AC fossa IV d/c'd- active bleeding x10+ minutes- I suggest checking H/H I vs CBC with diff in AM and assessing- if Hb dropped, this is cause- make sure nursing hold pressure after every blood draw, etc for prolonged period. Of note, dressing saturated, gown/bedding soaked to mattress from IV site- was cleaned up and bleeding stopped with additional pressure.  21. Anemia: Hgb 10.2-->9.7: repeat Monday   LOS: 1 days A FACE TO FACE EVALUATION WAS PERFORMED  Martha Clan P Mitsugi Schrader 05/27/2020, 9:21 AM

## 2020-05-27 NOTE — Progress Notes (Signed)
Kirsteins, Luanna Salk, MD  Physician  Physical Medicine and Rehabilitation  Consult Note     Signed  Date of Service:  05/25/2020  5:25 AM      Related encounter: ED to Hosp-Admission (Discharged) from 05/23/2020 in [place] [facility]       Signed      Expand All Collapse All     Show:Clear all [x] Manual[x] Template[] Copied  Added by: [x] Angiulli, Lavon Paganini, PA-C[x] Kirsteins, Luanna Salk, MD   [] Hover for details           Physical Medicine and Rehabilitation Consult Reason for Consult: Right side weakness with left gaze preference and inability to speak Referring Physician: Dr. Leonel Ramsay     HPI: [name] is a 87 y.o. right-handed male with history of atrial fibrillation who had been on Eliquis in the past but self DC'd due to a leg hematoma, CAD with stenting maintained on aspirin, CKD stage III, hypertension, hyperlipidemia, seizure disorder maintained on Depakote followed by Dr. 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  Per chart review patient lives with spouse.  1 level home with ramped entrance.  Patient uses transfer board in and out of wheelchair he no longer uses a prosthesis.  Patient's wife assists with bathing and dressing as well as pericare.  Presented 05/23/2020 with right side weakness left gaze preference and inability to speak.  Cranial CT scan negative for acute changes.  CT angiogram head and neck negative for emergent large vessel occlusion.  Left MCA branches appeared stable from 2020 CTA.  Patient did receive TPA.  EEG negative for seizure but suggestive of mild to moderate diffuse encephalopathy.  MRI showed patchy abnormal diffusion at the left perirolandic cortex most resembling acute ischemia.  No substantial edema or mass-effect in that region.  Echocardiogram with ejection fraction of 55 to 60% no wall motion abnormalities.  Admission chemistries unremarkable except  glucose 153 BUN 36 creatinine 1.39, hemoglobin 10.5, valproic acid level 41.  Neurology follow-up work-up currently ongoing.  Dysphagia #2 nectar thick liquid diet.  Therapy evaluations completed due to patient decreased functional mobility recommendations of physical medicine rehab consult. The patient has been dependent upon his wife for a couple years now.  He has required sliding board transfers to get in and out of bed to go to doctors offices.  The patient has a prosthetic for his right BKA but he has never used it due to skin issues.  The patient also has severe limitations in the left shoulder and was advised to have shoulder replacement but his medical status would not allow it.  Since this hospitalization he has had slurring of speech and right shoulder weakness as well as his swallowing difficulties.   Review of Systems  Constitutional: Negative for chills and fever.  HENT: Negative for hearing loss.   Eyes: Negative for blurred vision and double vision.  Respiratory: Negative for cough and shortness of breath.   Cardiovascular: Positive for palpitations and leg swelling.  Gastrointestinal: Positive for constipation. Negative for heartburn, nausea and vomiting.       GERD  Genitourinary: Negative for dysuria, flank pain and hematuria.       Incontinence of bowel and bladder  Musculoskeletal: Positive for joint pain and myalgias.  Skin: Negative for rash.  Neurological: Positive for dizziness, speech change and weakness.  Psychiatric/Behavioral: Positive for memory loss.  All other systems reviewed and are negative.  Past Medical History:  Diagnosis Date  . Abnormality of gait 09/22/2015  . Arthritis    . Atrial fibrillation ([place])    . CAD (coronary artery disease)      Stent to RCA, Penta stent, 99% reduced to 0% 2002.  . Cancer ([place])      skin CA removed from back  . 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 Chronic insomnia 03/30/2015  . Chronic  kidney disease, stage 3 ([place])    . Complication of anesthesia      trouble waking up  . GERD (gastroesophageal reflux disease)    . Hypercholesteremia    . Hypertension    . Hypothyroidism    . Iron deficiency anemia    . Memory difficulty 09/22/2015  . Osteoarthritis    . PAD (peripheral artery disease) ([place])    . Pneumonia    . Seizures ([place])    . Sepsis ([place]) 05/2017  . Transient alteration of awareness 03/30/2015  . Vertigo      hx of  . Vitamin D deficiency           Past Surgical History:  Procedure Laterality Date  . AMPUTATION Right 03/28/2018    Procedure: AMPUTATION BELOW KNEE;  Surgeon: Newt Minion, MD;  Location: [facility];  Service: Orthopedics;  Laterality: Right;  . APPLICATION OF WOUND VAC Right 01/23/2019    Procedure: Application Of  Prevena Wound Vac;  Surgeon: Newt Minion, MD;  Location: [facility];  Service: Orthopedics;  Laterality: Right;  . BACK SURGERY      . BIOPSY   01/08/2020    Procedure: BIOPSY;  Surgeon: Gatha Mayer, MD;  Location: [facility];  Service: Endoscopy;;  . ESOPHAGOGASTRODUODENOSCOPY (EGD) WITH PROPOFOL N/A 01/08/2020    Procedure: ESOPHAGOGASTRODUODENOSCOPY (EGD) WITH PROPOFOL;  Surgeon: Gatha Mayer, MD;  Location: [facility];  Service: Endoscopy;  Laterality: N/A;  . EYE SURGERY        Bilateral Cataract surgery   . HERNIA REPAIR      . I & D EXTREMITY Right 05/10/2015    Procedure: IRRIGATION AND DEBRIDEMENT EXTREMITY;  Surgeon: Leanora Cover, MD;  Location: [facility];  Service: Orthopedics;  Laterality: Right;  . KNEE ARTHROPLASTY        right knee X 2; left knee once  . LAMINECTOMY        X 6  . LEG AMPUTATION BELOW KNEE Right 03/28/2018  . POSTERIOR CERVICAL FUSION/FORAMINOTOMY   01/28/2012    Procedure: POSTERIOR CERVICAL FUSION/FORAMINOTOMY LEVEL 3;  Surgeon: [name], MD;  Location: [facility];  Service: Neurosurgery;  Laterality: Left;  Posterior Cervical Five-Thoracic One Fusion, Arthrodesis  with LEFT Cervical Seven-thoracic One Laminectomy, Foraminotomy and Resection of Synovial Cyst  . POSTERIOR CERVICAL FUSION/FORAMINOTOMY N/A 01/29/2013    Procedure: POSTERIOR CERVICAL FUSION/FORAMINOTOMY LEVEL 1 and C2-5 Posteriolateral Arthrodesis;  Surgeon: [name], MD;  Location: [facility];  Service: Neurosurgery;  Laterality: N/A;  C2-C3 Laminectomy C2-C3 posterior cervical arthrodesis  . STUMP REVISION Right 01/23/2019    Procedure: REVISION RIGHT BELOW KNEE AMPUTATION;  Surgeon: Newt Minion, MD;  Location: [facility];  Service: Orthopedics;  Laterality: Right;  . TONSILLECTOMY             Family History  Problem Relation Age of Onset  . Hypertension Mother    . Cancer Mother    . Kidney failure Father    . Heart disease Father  Social History:  reports that he has quit smoking. He has a 5.00 pack-year smoking history. He has never used smokeless tobacco. He reports previous alcohol use. He reports that he does not use drugs. Allergies:       Allergies  Allergen Reactions  . Eliquis [Apixaban] Other (See Comments)      bleeding  . Demerol [Meperidine] Nausea And Vomiting  . Keppra [Levetiracetam] Other (See Comments)      Causes sleepiness, mental status changes  . Vimpat [Lacosamide] Other (See Comments)      Over sedated           Medications Prior to Admission  Medication Sig Dispense Refill  . alendronate (FOSAMAX) 70 MG tablet Take 70 mg by mouth every Wednesday. Remain upright for 30-60 minutes.      Marland Kitchen aspirin EC 81 MG tablet Take 81 mg by mouth daily.      . Calcium Carb-Cholecalciferol (CALCIUM 600+D) 600-800 MG-UNIT TABS Take 1 tablet by mouth daily.      Marland Kitchen diltiazem (CARDIZEM CD) 240 MG 24 hr capsule TAKE 1 CAPSULE BY MOUTH EVERY MORNING. HOLD IF SYSTOLIC BLOOD PRESSURE IS LESS THAN 100 (Patient taking differently: Take 240 mg by mouth daily. Hold if systolic blood pressure is less than 100.) 90 capsule 3  . divalproex (DEPAKOTE ER) 250 MG 24 hr  tablet Take 250 mg by mouth in the morning and at bedtime.       . docusate sodium (COLACE) 100 MG capsule Take 100 mg by mouth daily as needed for mild constipation.      Marland Kitchen doxazosin (CARDURA) 4 MG tablet TAKE 1 TABLET(4 MG) BY MOUTH AT BEDTIME (Patient taking differently: Take 4 mg by mouth at bedtime.) 90 tablet 3  . furosemide (LASIX) 20 MG tablet Take 1 tablet (20 mg total) by mouth daily. 90 tablet 3  . HYDROcodone-acetaminophen (NORCO) 10-325 MG tablet Take 1 tablet by mouth every 4 (four) hours as needed for pain.      Marland Kitchen levothyroxine (SYNTHROID, LEVOTHROID) 150 MCG tablet Take 150 mcg by mouth daily before breakfast.      . lidocaine (LIDODERM) 5 % Place 1-5 patches onto the skin daily. Remove & Discard patch within 12 hours or as directed by MD (lower back and shoulders)      . metoprolol tartrate (LOPRESSOR) 25 MG tablet TAKE 1 TABLET BY MOUTH TWICE DAILY WITH A MEAL OR IMMEDIATELY AFTER (Patient taking differently: Take 25 mg by mouth 2 (two) times daily. WITH A MEAL OR IMMEDIATELY AFTER) 180 tablet 3  . Multiple Vitamins-Minerals (CERTAVITE/ANTIOXIDANTS) TABS Take 1 tablet by mouth every evening.       . nitrofurantoin (MACRODANTIN) 100 MG capsule Take 100 mg by mouth daily.      . nitroGLYCERIN (NITROSTAT) 0.4 MG SL tablet Place 1 tablet (0.4 mg total) under the tongue every 5 (five) minutes as needed for chest pain (call 911 afer 3 doses and if chest pain persists). 25 tablet 11  . polyethylene glycol (MIRALAX / GLYCOLAX) 17 g packet Take 17 g by mouth at bedtime.      . potassium chloride SA (KLOR-CON) 20 MEQ tablet Take 0.5 tablets (10 mEq total) by mouth daily. 45 tablet 3  . pravastatin (PRAVACHOL) 40 MG tablet Take 40 mg by mouth every evening.    2  . predniSONE (DELTASONE) 1 MG tablet Take 4 mg by mouth daily with breakfast.      . predniSONE (DELTASONE) 5 MG tablet Take 5  mg by mouth daily.      . mupirocin ointment (BACTROBAN) 2 % Apply 1 application topically 2 (two) times  daily. Apply to the affected area 2 times a day (Patient not taking: Reported on 05/23/2020) 22 g 3  . pantoprazole (PROTONIX) 40 MG tablet Take 1 tablet (40 mg total) by mouth 2 (two) times daily. (Patient not taking: Reported on 05/23/2020) 60 tablet 0  . pentoxifylline (TRENTAL) 400 MG CR tablet TAKE 1 TABLET(400 MG) BY MOUTH THREE TIMES DAILY WITH MEALS (Patient not taking: Reported on 05/23/2020) 90 tablet 3  . sulfamethoxazole-trimethoprim (BACTRIM DS) 800-160 MG tablet Take 1 tablet by mouth 2 (two) times daily. (Patient not taking: No sig reported) 20 tablet 0  . sulfamethoxazole-trimethoprim (BACTRIM DS) 800-160 MG tablet Take 1 tablet by mouth 2 (two) times daily. (Patient not taking: No sig reported) 30 tablet 0      Home: Home Living Family/patient expects to be discharged to:: Private residence Living Arrangements: Spouse/significant other Available Help at Discharge: Family,Available 24 hours/day Type of Home: House Home Access: Ramped entrance [place]: One level Bathroom Shower/Tub: Chiropodist: Handicapped height Bathroom Accessibility: Yes Home Equipment: Environmental consultant - 4 wheels,Cane - single point,Bedside commode,Shower seat,Wheelchair - manual,Grab bars - tub/shower,Transport chair,Hospital bed,Other (comment) (prosthetic) Additional Comments: transfer board  Lives With: Spouse  Functional History: Prior Function Level of Independence: Needs assistance Gait / Transfers Assistance Needed: pt uses transfer board for transfer in and out of w/c, does not don prosthesis anymore ADL's / Homemaking Assistance Needed: Pt's wife dresses, bathes, performs pericare for pt Comments: wife reports incontinence of stool is not abnormal for patient Functional Status:  Mobility: Bed Mobility Overal bed mobility: Needs Assistance Bed Mobility: Rolling,Sidelying to Sit,Sit to Supine Rolling: Mod assist,+2 for physical assistance Sidelying to sit: Mod assist,+2 for  physical assistance Sit to supine: Mod assist,+2 for physical assistance General bed mobility comments: mod +2 for rolling bilaterally for truncal and LE translation, trunk elevation/lowering, and LE translation to/from EOB. PT and OT providing assist for boost up in bed. Transfers General transfer comment: defer to next session Ambulation/Gait General Gait Details: unable   ADL: ADL Overall ADL's : Needs assistance/impaired Eating/Feeding: Maximal assistance,Bed level Eating/Feeding Details (indicate cue type and reason): positioned for lunch tray with pillows in chair position General ADL Comments: total (A) for all adls at this time. pt would benefit from hoyer lift only due to risk for falls and skin break down   Cognition: Cognition Overall Cognitive Status: History of cognitive impairments - at baseline Arousal/Alertness: Awake/alert Orientation Level: Oriented X4 Attention: Sustained Sustained Attention: Appears intact Memory: Impaired Memory Impairment: Decreased recall of new information Awareness: Impaired Awareness Impairment: Anticipatory impairment Problem Solving: Impaired Problem Solving Impairment: Verbal basic Safety/Judgment: Impaired Cognition Arousal/Alertness: Awake/alert Behavior During Therapy: Flat affect Overall Cognitive Status: History of cognitive impairments - at baseline Area of Impairment: Following commands,Safety/judgement,Problem solving Following Commands: Follows one step commands with increased time Safety/Judgement: Decreased awareness of safety Problem Solving: Requires verbal cues,Requires tactile cues,Slow processing,Decreased initiation,Difficulty sequencing General Comments: pt drowsy during session, but participatory. Pt requires step-by-step cuing for mobility, especially moving to/from EOB.   Blood pressure (!) 174/95, pulse 99, temperature 98.3 F (36.8 C), temperature source Oral, resp. rate 17, height 5' 9.02" (1.753 m), weight  70.1 kg, SpO2 97 %. Physical Exam Vitals and nursing note reviewed.  Constitutional:      Appearance: He is normal weight.  Eyes:     Extraocular Movements: Extraocular  movements intact.     Conjunctiva/sclera: Conjunctivae normal.     Pupils: Pupils are equal, round, and reactive to light.  Cardiovascular:     Rate and Rhythm: Normal rate and regular rhythm.     Heart sounds: Normal heart sounds.  Pulmonary:     Effort: Pulmonary effort is normal.     Breath sounds: Normal breath sounds. No stridor.  Abdominal:     General: Abdomen is flat. Bowel sounds are normal. There is no distension.     Palpations: Abdomen is soft.  Skin:    General: Skin is warm and dry.  Neurological:     Mental Status: He is alert and oriented to person, place, and time.     Cranial Nerves: Dysarthria present.     Comments: Patient is alert no acute distress.  He does have an element of expressive aphasia and some dysarthria.  He was able to provide his name.  He was not able to name objects.  Followed inconsistent commands.  He was easily distracted. Right central 7 Upper extremity coordination difficult to assess secondary to bilateral shoulder weakness and contractures does have reduced finger to thumb opposition speed Gait not tested due to weakness and BKA  Psychiatric:        Attention and Perception: He is inattentive.        Mood and Affect: Mood normal.        Speech: Speech is delayed.        Behavior: Behavior is slowed.    Oriented to person and place and month but not day and date Musculoskeletal there is evidence of left shoulder crepitus and contracture with limitations of abduction forward flexion Right shoulder no crepitus good passive range. Motor strength is 3 - bilateral deltoid 4 bilateral bicep tricep finger flexors and extensors 3 right hip flexor 4 left hip flexor 4 left knee extensor 4 left ankle dorsiflexor Extremity right BKA well-healed no edema no tenderness Sensation  intact to light touch left lower limb, upper limbs have paresthesias with light touch but able to localize Lab Results Last 24 Hours       Results for orders placed or performed during the hospital encounter of 05/23/20 (from the past 24 hour(s))  Hemoglobin A1c     Status: None    Collection Time: 05/24/20  7:15 AM  Result Value Ref Range    Hgb A1c MFr Bld 5.1 4.8 - 5.6 %    Mean Plasma Glucose 99.67 mg/dL  Lipid panel     Status: None    Collection Time: 05/24/20  7:15 AM  Result Value Ref Range    Cholesterol 124 0 - 200 mg/dL    Triglycerides 84 <150 mg/dL    HDL 56 >40 mg/dL    Total CHOL/HDL Ratio 2.2 RATIO    VLDL 17 0 - 40 mg/dL    LDL Cholesterol 51 0 - 99 mg/dL       Imaging Results (Last 48 hours)  CT ANGIO HEAD W OR WO CONTRAST   Result Date: 05/23/2020 CLINICAL DATA:  87 year old male code stroke presentation with left MCA territory implicated. Also history of seizures. EXAM: CT ANGIOGRAPHY HEAD AND NECK TECHNIQUE: Multidetector CT imaging of the head and neck was performed using the standard protocol during bolus administration of intravenous contrast. Multiplanar CT image reconstructions and MIPs were obtained to evaluate the vascular anatomy. Carotid stenosis measurements (when applicable) are obtained utilizing NASCET criteria, using the distal internal carotid diameter as the denominator.  CONTRAST:  34mL OMNIPAQUE IOHEXOL 350 MG/ML SOLN COMPARISON:  CTA head and neck 03/24/2018. Plain head CT 0857 hours today. FINDINGS: CTA NECK Skeleton: Chronic severe cervical spine degeneration status post long segment anterior and posterior fusion. Bulky degenerative ligamentous hypertrophy about the odontoid. Evidence of moderate cervicomedullary junction spinal stenosis, also evident by MRI in 2020. No acute osseous abnormality identified. Upper chest: Lower lung volumes with atelectasis. Negative visible superior mediastinum. Other neck: Small volume retained secretions in the  pharynx. No other acute neck soft tissue finding. Aortic arch: Stable aortic arch with mild atherosclerosis. Generalized tortuosity of the great vessels. Right carotid system: Tortuous brachiocephalic artery and especially right CCA origin without stenosis. Streak artifact at the right ICA bulb. Relatively mild origin and bulb atherosclerosis with no significant stenosis. Partially retropharyngeal course of the right ICA which remains patent to the skull base. Left carotid system: Tortuous proximal left CCA with a kinked appearance at the thoracic inlet but no stenosis. Mild plaque at the left ICA origin. Moderate calcified plaque at the bulb but no significant stenosis. The left ICA remains patent to the skull base. Vertebral arteries: Tortuosity and calcified plaque at the proximal right subclavian artery without significant stenosis. Chronic bulky calcified plaque and severe stenosis at the right vertebral artery origin. The right vertebral does remain patent to the skull base, is highly tortuous in the V2 segment. Tortuous proximal left subclavian artery with calcified plaque but no significant stenosis. Calcified plaque at the left vertebral artery origin resulting in mild or at most moderate stenosis. Tortuous left vertebral artery in the V2 segment, remains patent to the skull base. CTA HEAD Posterior circulation: Bilateral V4 segment calcified plaque with moderate to severe V4 segment stenosis greater on the right (series 6, image 297). Both distal V4 segments remain patent to the basilar. Right PICA origin remains patent. Patent basilar artery with mild irregularity and no significant stenosis. Patent SCA origins. Fetal type bilateral PCA is again noted. Bilateral PCA branches appears stable and mildly irregular. Anterior circulation: Both ICA siphons are patent. Tortuous bilateral siphons with extensive calcified plaque but only mild supraclinoid stenosis on the right. Bilateral ophthalmic and posterior  communicating artery origins remain normal. Patent carotid termini, MCA and ACA origins with dominant left and diminutive or absent right ACA A1 segment. Anterior communicating artery and bilateral ACA branches appear stable since 2020. Right MCA M1 segment and bifurcation remain patent. Right MCA branches appear stable since 2020. Left MCA M1 segment and left MCA trifurcation is patent with mild irregularity but no significant stenosis. Left MCA M2 branches appears stable since 2020. And allowing for different contrast timing today, the proximal M3 branches also seem to remain patent. Venous sinuses: Early contrast timing, but grossly patent. Anatomic variants: Fetal type PCA origins, dominant left ACA A1. Review of the MIP images confirms the above findings IMPRESSION: 1. Negative for emergent large vessel occlusion. Left MCA branches appear stable from a 2020 CTA. This was reviewed in person with Dr. Roland Rack at [phone] hours. 2. Positive for chronic arterial tortuosity and calcified plaque in the head and neck. 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 - Moderate Left Vertebral V4 segment. 3. Chronic very severe cervical spine degeneration. 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 Electronically Signed   By: Genevie Ann M.D.   On: 05/23/2020 09:50    CT HEAD WO CONTRAST   Result Date: 05/24/2020 CLINICAL DATA:  Stroke follow-up. EXAM: CT HEAD WITHOUT CONTRAST TECHNIQUE: Contiguous axial images were obtained from the base of the skull through the vertex without intravenous contrast. COMPARISON:  May 23, 2020 FINDINGS: Brain: Findings concerning for left perirolandic acute ischemia were better characterized on prior MRI. No substantial edema or mass effect in this region. No evidence of interval/new acute large vascular territory infarct. No mass occupying hemorrhagic transformation. Similar  generalized cerebral volume loss with ex vacuo ventricular dilation and chronic microvascular ischemic disease. No evidence of a mass lesion or abnormal mass effect. Vascular: Calcific atherosclerosis. No hyperdense vessel identified. Skull: No acute fracture. Sinuses/Orbits: Clear sinuses.  Unremarkable orbits. Other: No mastoid effusions. IMPRESSION: Findings concerning for left perirolandic acute ischemia were better characterized on prior MRI. No substantial edema or mass effect in this region. No mass occupying hemorrhagic transformation. Electronically Signed   By: Margaretha Sheffield MD   On: 05/24/2020 11:40    CT ANGIO NECK W OR WO CONTRAST   Result Date: 05/23/2020 CLINICAL DATA:  87 year old male code stroke presentation with left MCA territory implicated. Also history of seizures. EXAM: CT ANGIOGRAPHY HEAD AND NECK TECHNIQUE: Multidetector CT imaging of the head and neck was performed using the standard protocol during bolus administration of intravenous contrast. Multiplanar CT image reconstructions and MIPs were obtained to evaluate the vascular anatomy. Carotid stenosis measurements (when applicable) are obtained utilizing NASCET criteria, using the distal internal carotid diameter as the denominator. CONTRAST:  68mL OMNIPAQUE IOHEXOL 350 MG/ML SOLN COMPARISON:  CTA head and neck 03/24/2018. Plain head CT 0857 hours today. FINDINGS: CTA NECK Skeleton: Chronic severe cervical spine degeneration status post long segment anterior and posterior fusion. Bulky degenerative ligamentous hypertrophy about the odontoid. Evidence of moderate cervicomedullary junction spinal stenosis, also evident by MRI in 2020. No acute osseous abnormality identified. Upper chest: Lower lung volumes with atelectasis. Negative visible superior mediastinum. Other neck: Small volume retained secretions in the pharynx. No other acute neck soft tissue finding. Aortic arch: Stable aortic arch with mild atherosclerosis. Generalized  tortuosity of the great vessels. Right carotid system: Tortuous brachiocephalic artery and especially right CCA origin without stenosis. Streak artifact at the right ICA bulb. Relatively mild origin and bulb atherosclerosis with no significant stenosis. Partially retropharyngeal course of the right ICA which remains patent to the skull base. Left carotid system: Tortuous proximal left CCA with a kinked appearance at the thoracic inlet but no stenosis. Mild plaque at the left ICA origin. Moderate calcified plaque at the bulb but no significant stenosis. The left ICA remains patent to the skull base. Vertebral arteries: Tortuosity and calcified plaque at the proximal right subclavian artery without significant stenosis. Chronic bulky calcified plaque and severe stenosis at the right vertebral artery origin. The right vertebral does remain patent to the skull base, is highly tortuous in the V2 segment. Tortuous proximal left subclavian artery with calcified plaque but no significant stenosis. Calcified plaque at the left vertebral artery origin resulting in mild or at most moderate stenosis. Tortuous left vertebral artery in the V2 segment, remains patent to the skull base. CTA HEAD Posterior circulation: Bilateral V4 segment calcified plaque with moderate to severe V4 segment stenosis greater on the right (series 6, image 297). Both distal V4 segments remain patent to the basilar. Right PICA origin remains patent. Patent basilar artery with mild irregularity and no significant stenosis. Patent SCA origins. Fetal type bilateral PCA is again noted. Bilateral PCA branches appears stable and mildly irregular. Anterior circulation: Both ICA siphons are  patent. Tortuous bilateral siphons with extensive calcified plaque but only mild supraclinoid stenosis on the right. Bilateral ophthalmic and posterior communicating artery origins remain normal. Patent carotid termini, MCA and ACA origins with dominant left and diminutive  or absent right ACA A1 segment. Anterior communicating artery and bilateral ACA branches appear stable since 2020. Right MCA M1 segment and bifurcation remain patent. Right MCA branches appear stable since 2020. Left MCA M1 segment and left MCA trifurcation is patent with mild irregularity but no significant stenosis. Left MCA M2 branches appears stable since 2020. And allowing for different contrast timing today, the proximal M3 branches also seem to remain patent. Venous sinuses: Early contrast timing, but grossly patent. Anatomic variants: Fetal type PCA origins, dominant left ACA A1. Review of the MIP images confirms the above findings IMPRESSION: 1. Negative for emergent large vessel occlusion. Left MCA branches appear stable from a 2020 CTA. This was reviewed in person with Dr. Roland Rack at [phone] hours. 2. Positive for chronic arterial tortuosity and calcified plaque in the head and neck. 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 - Moderate Left Vertebral V4 segment. 3. Chronic very severe cervical spine degeneration. 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 Electronically Signed   By: Genevie Ann M.D.   On: 05/23/2020 09:50    MR BRAIN WO CONTRAST   Result Date: 05/23/2020 CLINICAL DATA:  87 year old male code stroke presentation with left MCA territory implicated. Also history of seizures. EXAM: MRI HEAD WITHOUT CONTRAST TECHNIQUE: Multiplanar, multiecho pulse sequences of the brain and surrounding structures were obtained without intravenous contrast. COMPARISON:  CT head and CTA head and neck today. Prior brain MRI 03/25/2018. FINDINGS: Abbreviated exam by design due to code stroke presentation and IV tPA candidate. Brain: Patchy abnormal diffusion at the left superior perirolandic cortex and involving some subcortical white matter (series 2, image 40 and ADC map series 250, image 39. This  more resembles acute ischemia than postictal sequelae. Right face representation area seems most affected. There is currently no associated FLAIR hyperintensity. No convincing T2 hyperintensity. No other abnormal diffusion. Patchy and confluent bilateral cerebral white matter T2 and FLAIR hyperintensity appears stable since 2020. Expected evolution of the punctate right cerebellar infarct that year. No midline shift, mass effect, evidence of mass lesion, ventriculomegaly, extra-axial collection or acute intracranial hemorrhage. Cervicomedullary junction and pituitary are within normal limits. No chronic cerebral blood products identified. Vascular: Major intracranial vascular flow voids are stable since 2020. Skull and upper cervical spine: Chronic severe cervical spine degeneration also demonstrated by CTA today. Bulky chronic ligamentous hypertrophy about the odontoid contributing to multifactorial cervicomedullary junction and C1 level spinal stenosis (series 4, image 13 today). Hardware susceptibility artifact in the cervical spine. Underlying bone marrow signal appears normal. Sinuses/Orbits: Stable, negative. Other: Mastoids remain clear. Visible internal auditory structures appear normal. Postoperative changes to the suboccipital soft tissues. IMPRESSION: 1. Patchy abnormal diffusion at the left perirolandic cortex most resembling acute ischemia. No T2 FLAIR changes or hemorrhage, mass effect at this time. This was discussed in person with Dr. Roland Rack at [phone] hours. 2. Otherwise stable chronic small vessel disease since the 2020 MRI. 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 Electronically Signed   By: Genevie Ann M.D.   On: 05/23/2020 09:58    DG Chest Port 1 View   Result Date: 05/23/2020 CLINICAL DATA:  Cough.  History  of aspiration pneumonia. EXAM: PORTABLE CHEST 1 VIEW COMPARISON:  Radiograph 01/07/2020 FINDINGS: Patient is  rotated and leaning to the right. Lung volumes are low. Cardiomegaly with aortic atherosclerosis. Mild left basilar scarring, similar to prior. No acute airspace disease. There may be trace pleural effusions. No pulmonary edema. Surgical hardware in the cervical spine is partially included. IMPRESSION: 1. Low lung volumes with left basilar scarring. No new airspace disease. 2. Cardiomegaly with aortic atherosclerosis. 3. Possible trace pleural effusions. Electronically Signed   By: Keith Rake M.D.   On: 05/23/2020 20:16    EEG adult   Result Date: 05/24/2020 Lora Havens, MD     05/24/2020  8:47 AM Patient Name: [name] MRN: [id number] Epilepsy Attending: Lora Havens Referring Physician/Provider: Dr. Roland Rack Date: 05/23/2020 Duration: 23.19 mins Patient history: 87 yo male who presented as a code stroke due to aphasia, right sided weakness, left gaze preference.  EEG to evaluate for seizure. Level of alertness: Awake, asleep AEDs during EEG study: VPA Technical aspects: This EEG study was done with scalp electrodes positioned according to the 10-20 International system of electrode placement. Electrical activity was acquired at a sampling rate of 500Hz  and reviewed with a high frequency filter of 70Hz  and a low frequency filter of 1Hz . EEG data were recorded continuously and digitally stored. Description: The posterior dominant rhythm consists of 7.5 Hz activity of moderate voltage (25-35 uV) seen predominantly in posterior head regions, symmetric and reactive to eye opening and eye closing. Sleep was characterized by sleep spindles (12 to 14 Hz), maximal frontocentral region.  EEG also showed continuous generalized 3 to 7 Hz theta-delta slowing.  Hyperventilation and photic stimulation were not performed.   ABNORMALITY - Continuous slow, generalized IMPRESSION: This study is suggestive of mild to moderate diffuse encephalopathy, nonspecific etiology.  No seizures or epileptiform  discharges were seen throughout the recording. Lora Havens    ECHOCARDIOGRAM COMPLETE   Result Date: 05/23/2020    ECHOCARDIOGRAM REPORT   Patient Name:   TRICIA OAXACA Date of Exam: 05/23/2020 Medical Rec #:  [id number]      Height:       69.0 in Accession #:    [id number]     Weight:       154.5 lb Date of Birth:  01-02-1934      BSA:          1.852 m Patient Age:    57 years       BP:           137/74 mmHg Patient Gender: M              HR:           75 bpm. Exam Location:  Inpatient Procedure: 2D Echo, Cardiac Doppler, Color Doppler and Intracardiac            Opacification Agent Indications:    Stroke  History:        Patient has prior history of Echocardiogram examinations, most                 recent 03/25/2018. TIA, Arrythmias:Atrial Fibrillation; Risk                 Factors:Former Smoker and Hypertension. GERD.  Sonographer:    Clayton Lefort RDCS (AE) Referring Phys: [phone] [facility]  Sonographer Comments: Technically difficult study due to poor echo windows and suboptimal subcostal window. IMPRESSIONS  1. Left ventricular ejection fraction, by  estimation, is 55 to 60%. The left ventricle has normal function. The left ventricle has no regional wall motion abnormalities. There is severe concentric left ventricular hypertrophy. Left ventricular diastolic  function could not be evaluated.  2. Right ventricular systolic function is normal. The right ventricular size is normal.  3. Left atrial size was moderately dilated.  4. Right atrial size was mildly dilated.  5. The mitral valve is grossly normal. Mild mitral valve regurgitation.  6. The aortic valve is grossly normal. Aortic valve regurgitation is trivial. FINDINGS  Left Ventricle: Left ventricular ejection fraction, by estimation, is 55 to 60%. The left ventricle has normal function. The left ventricle has no regional wall motion abnormalities. Definity contrast agent was given IV to delineate the left ventricular  endocardial borders. The  left ventricular internal cavity size was small. There is severe concentric left ventricular hypertrophy. Left ventricular diastolic function could not be evaluated due to atrial fibrillation. Left ventricular diastolic function  could not be evaluated. Right Ventricle: The right ventricular size is normal. No increase in right ventricular wall thickness. Right ventricular systolic function is normal. Left Atrium: Left atrial size was moderately dilated. Right Atrium: Right atrial size was mildly dilated. Pericardium: There is no evidence of pericardial effusion. Mitral Valve: The mitral valve is grossly normal. Mild mitral valve regurgitation. Tricuspid Valve: The tricuspid valve is grossly normal. Tricuspid valve regurgitation is mild. Aortic Valve: The aortic valve is grossly normal. Aortic valve regurgitation is trivial. Aortic valve mean gradient measures 2.0 mmHg. Aortic valve peak gradient measures 3.5 mmHg. Aortic valve area, by VTI measures 2.87 cm. Pulmonic Valve: The pulmonic valve was grossly normal. Pulmonic valve regurgitation is trivial. Aorta: The aortic root and ascending aorta are structurally normal, with no evidence of dilitation. IAS/Shunts: The atrial septum is grossly normal.  LEFT VENTRICLE PLAX 2D LVIDd:         3.00 cm LVIDs:         2.20 cm LV PW:         1.90 cm LV IVS:        1.90 cm LVOT diam:     1.90 cm LV SV:         45 LV SV Index:   24 LVOT Area:     2.84 cm  RIGHT VENTRICLE RV Basal diam:  2.80 cm RV S prime:     11.20 cm/s TAPSE (M-mode): 1.4 cm LEFT ATRIUM           Index       RIGHT ATRIUM           Index LA diam:      4.00 cm 2.16 cm/m  RA Area:     20.70 cm LA Vol (A2C): 52.4 ml 28.30 ml/m RA Volume:   48.50 ml  26.19 ml/m LA Vol (A4C): 81.7 ml 44.12 ml/m  AORTIC VALVE AV Area (Vmax):    2.57 cm AV Area (Vmean):   2.43 cm AV Area (VTI):     2.87 cm AV Vmax:           94.20 cm/s AV Vmean:          63.733 cm/s AV VTI:            0.156 m AV Peak Grad:      3.5 mmHg AV  Mean Grad:      2.0 mmHg LVOT Vmax:         85.30 cm/s LVOT Vmean:  54.600 cm/s LVOT VTI:          0.158 m LVOT/AV VTI ratio: 1.01  AORTA Ao Root diam: 3.90 cm Ao Asc diam:  3.30 cm  SHUNTS Systemic VTI:  0.16 m Systemic Diam: 1.90 cm Mertie Moores MD Electronically signed by Mertie Moores MD Signature Date/Time: 05/23/2020/4:44:58 PM    Final     CT HEAD CODE STROKE WO CONTRAST   Result Date: 05/23/2020 CLINICAL DATA:  Code stroke.  87 year old male. EXAM: CT HEAD WITHOUT CONTRAST TECHNIQUE: Contiguous axial images were obtained from the base of the skull through the vertex without intravenous contrast. COMPARISON:  Brain MRI 03/25/2018.  Head CT 03/24/2018. FINDINGS: Brain: No acute intracranial hemorrhage identified. No midline shift, mass effect, or evidence of intracranial mass lesion. Stable ventricle size and configuration. Patchy and confluent bilateral white matter hypodensity does not appear significantly changed since 2020. No cortically based acute infarct identified. No cortical encephalomalacia identified. Vascular: Extensive Calcified atherosclerosis at the skull base. No suspicious intracranial vascular hyperdensity. Skull: No acute osseous abnormality identified. Sinuses/Orbits: Visualized paranasal sinuses and mastoids are clear. Other: No acute orbit or scalp soft tissues. ASPECTS [facility] Stroke Program Early CT Score) Total score (0-10 with 10 being normal): 10 IMPRESSION: 1. No acute cortically based infarct or acute intracranial hemorrhage identified. ASPECTS 10. 2. Chronic white matter disease appears stable since 2020. 3. These results were communicated to Dr. Leonel Ramsay at 9:17 am on 05/23/2020 by text page via the [facility] messaging system. Electronically Signed   By: Genevie Ann M.D.   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 1. Does the need for close, 24 hr/day medical  supervision in concert with the patient's rehab needs make it unreasonable for this patient to be served in a less intensive setting? Yes 2. Co-Morbidities requiring supervision/potential complications: Cervical spinal stenosis, history of right BKA 3. Due to bladder management, bowel management, safety, skin/wound care, disease management, medication administration, pain management and patient education, does the patient require 24 hr/day rehab nursing? Yes 4. Does the patient require coordinated care of a physician, rehab nurse, therapy disciplines of PT, OT, speech to address physical and functional deficits in the context of the above medical diagnosis(es)? Yes Addressing deficits in the following areas: balance, endurance, locomotion, strength, transferring, bowel/bladder control, bathing, dressing, feeding, grooming, toileting, cognition, speech, language, swallowing and psychosocial support 5. Can the patient actively participate in an intensive therapy program of at least 3 hrs of therapy per day at least 5 days per week? Potentially 6. The potential for patient to make measurable gains while on inpatient rehab is fair 7. Anticipated functional outcomes upon discharge from inpatient rehab are min assist  with PT, min assist and Wheelchair level slide board with OT, modified independent and Adequate intake for swallowing with SLP. 8. Estimated rehab length of stay to reach the above functional goals is: 16 to 18 days 9. Anticipated discharge destination: Home 10. Overall Rehab/Functional Prognosis: fair   RECOMMENDATIONS: This patient's condition is appropriate for continued rehabilitative care in the following setting: CIR Patient has agreed to participate in recommended program. Yes Note that insurance prior authorization may be required for reimbursement for recommended care.   Comment: Would need 15/7 schedule     Cathlyn Parsons, PA-C 05/25/2020    "I have personally performed a  face to face diagnostic evaluation of this patient.  Additionally, I have  reviewed and concur with the physician assistant's documentation above." Charlett Blake M.D. [place] [facility] Fellow Am [employer] of [employer] and Rehab Diplomate Am Board of Electrodiagnostic Med Fellow Am Board of Interventional Pain              Revision History                        Routing History              Note Details  Author Charlett Blake, MD File Time 05/25/2020  1:05 PM  Author Type Physician Status Signed  Last Editor Charlett Blake, MD Service [facility] [place] # [id number] [place] Date 05/26/2020

## 2020-05-27 NOTE — Progress Notes (Signed)
Robert Gong, RN  Rehab Admission Coordinator  Physical Medicine and Rehabilitation  PMR Pre-admission     Signed  Date of Service:  05/26/2020 10:27 AM      Related encounter: ED to Hosp-Admission (Discharged) from 05/23/2020 in [facility]       Signed          Show:Clear all [x] Manual[x] Template[x] Copied  Added by: [x] Robert Gong, RN   [] Hover for details  PMR Admission Coordinator Pre-Admission Assessment   Patient: [name] is an 87 y.o., male MRN: [id number] DOB: 13-Jun-1933 Height: 5' 9.02" (175.3 cm) Weight: 70.1 kg                                                                                                                                                  Insurance Information HMO:     PPO:      PCP:      IPA:      80/20:      OTHER:  PRIMARY: Medicare a and b      Policy#: [id number]      Subscriber: pt Benefits:  Phone #: passport one online     Name: 3/16 Eff. Date: 10/10/2000     Deduct: $1556      Out of Pocket Max: none      Life Max: none  CIR: 100%      SNF: 20 full days Outpatient: 80%     Co-Pay: 20% Home Health: 100%      Co-Pay: none DME: 80%     Co-Pay: 20% Providers: pt choice  SECONDARY: BCBS supplement      Policy#: [id number]   Financial Counselor:       Phone#:    The "Data Collection Information Summary" for patients in Inpatient Rehabilitation Facilities with attached "Privacy Act [place] Records" was provided and verbally reviewed with: Family   Emergency Contact Information         Contact Information     Name Relation Home Work Mobile    [name] [phone]   [phone]       Current Medical History  Patient Admitting Diagnosis: CVA   History of Present Illness: 87 year old right-handed male with history of atrial fibrillation who had been on Eliquis in the past but self DC 'd due to leg hematoma, CAD with stenting maintained on aspirin, diastolic  congestive heart failure, CKD stage III, hypertension, hyperlipidemia, seizure disorder maintained on Depakote followed by Dr. 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    Patient uses transfer board in and out of wheelchair he no longer uses his prosthesis due to skin issues.  Wife assist with bathing and dressing as well as peri care.  Presented 05/23/2020 with right side weakness left gaze preference  and inability to speak.  Cranial CT scan negative for acute changes.  CT angiogram of head and neck negative for emergent large vessel occlusion.  Left MCA branches appeared stable from 2020 CTA.  Patient did receive TPA.  EEG negative for seizure but suggestive of mild to moderate diffuse encephalopathy.  MRI showed patchy abnormal diffusion at the left perirolandic cortex most resembling acute ischemia.  No substantial edema or mass-effect in that region.  Echocardiogram with ejection fraction of 55 to 60% no wall motion abnormalities.  Maintained on Pradaxa for CVA prophylaxis.  Admission chemistries unremarkable except glucose 153 BUN 36 creatinine 1.39 hemoglobin 10.5 valproic acid level 41.  Maintain on a dysphagia #1 nectar thick liquid diet.     Complete NIHSS TOTAL: 4 [place] Coma Scale Score: 14   Past Medical History      Past Medical History:  Diagnosis Date  . Abnormality of gait 09/22/2015  . Arthritis    . Atrial fibrillation ([place])    . CAD (coronary artery disease)      Stent to RCA, Penta stent, 99% reduced to 0% 2002.  . Cancer ([place])      skin CA removed from back  . 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 Chronic insomnia 03/30/2015  . Chronic kidney disease, stage 3 ([place])    . Complication of anesthesia      trouble waking up  . GERD (gastroesophageal reflux disease)    . Hypercholesteremia    . Hypertension    . Hypothyroidism    . Iron deficiency anemia    . Memory difficulty  09/22/2015  . Osteoarthritis    . PAD (peripheral artery disease) ([place])    . Pneumonia    . Seizures ([place])    . Sepsis ([place]) 05/2017  . Transient alteration of awareness 03/30/2015  . Vertigo      hx of  . Vitamin D deficiency        Family History  family history includes Cancer in his mother; Heart disease in his father; Hypertension in his mother; Kidney failure in his father.   Prior Rehab/Hospitalizations:  Has the patient had prior rehab or hospitalizations prior to admission? Yes   Has the patient had major surgery during 100 days prior to admission? No   Current Medications    Current Facility-Administered Medications:  .  acetaminophen (TYLENOL) tablet 650 mg, 650 mg, Oral, Q4H PRN **OR** acetaminophen (TYLENOL) 160 MG/5ML solution 650 mg, 650 mg, Per Tube, Q4H PRN **OR** acetaminophen (TYLENOL) suppository 650 mg, 650 mg, Rectal, Q4H PRN, Bailey-Modzik, Delila A, NP .  atorvastatin (LIPITOR) tablet 80 mg, 80 mg, Oral, Daily, Bailey-Modzik, Delila A, NP, 80 mg at 05/25/20 0913 .  dabigatran (PRADAXA) capsule 150 mg, 150 mg, Oral, Q12H, von Dohlen, Haley B, RPH, 150 mg at 05/25/20 1755 .  diltiazem (CARDIZEM CD) 24 hr capsule 240 mg, 240 mg, Oral, Daily, Bailey-Modzik, Delila A, NP, 240 mg at 05/25/20 1340 .  divalproex (DEPAKOTE ER) 24 hr tablet 500 mg, 500 mg, Oral, BID, Bailey-Modzik, Delila A, NP, 500 mg at 05/25/20 2209 .  doxazosin (CARDURA) tablet 4 mg, 4 mg, Oral, QHS, Bailey-Modzik, Delila A, NP, 4 mg at 05/25/20 2209 .  furosemide (LASIX) tablet 20 mg, 20 mg, Oral, Daily, Bailey-Modzik, Delila A, NP .  HYDROcodone-acetaminophen (NORCO) 10-325 MG per tablet 1 tablet, 1 tablet, Oral, Q4H PRN, Bailey-Modzik, Delila A, NP, 1 tablet at 05/25/20 1736 .  levothyroxine (SYNTHROID) tablet 150  mcg, 150 mcg, Oral, QAC breakfast, Bailey-Modzik, Delila A, NP, 150 mcg at 05/26/20 0608 .  metoprolol tartrate (LOPRESSOR) tablet 25 mg, 25 mg, Oral, BID, Bailey-Modzik, Delila A, NP, 25  mg at 05/25/20 2209 .  pantoprazole (PROTONIX) EC tablet 40 mg, 40 mg, Oral, QHS, Bailey-Modzik, Delila A, NP, 40 mg at 05/25/20 2209 .  predniSONE (DELTASONE) tablet 4 mg, 4 mg, Oral, Q breakfast, Bailey-Modzik, Delila A, NP, 4 mg at 05/25/20 0733 .  Resource ThickenUp Clear, , Oral, PRN, Bailey-Modzik, Delila A, NP   Patients Current Diet:     Diet Order                      DIET - DYS 1 Room service appropriate? No; Fluid consistency: Nectar Thick  Diet effective now                      Precautions / Restrictions Precautions Precautions: Fall Precaution Comments: very thin skin, skin tears easily with multiple sites of skin breakdown on lateral aspect of R residual limb, skin tear under BP cuff; R neglect? Restrictions Weight Bearing Restrictions: No    Has the patient had 2 or more falls or a fall with injury in the past year?No   Prior Activity Level Limited Community (1-2x/wk): wife assisted with all adls   Prior Functional Level Prior Function Level of Independence: Needs assistance Gait / Transfers Assistance Needed: pt uses transfer board for transfer in and out of w/c, does not don prosthesis anymore ADL's / Homemaking Assistance Needed: Pt's wife dresses, bathes, performs pericare for pt Comments: wife reports incontinence of stool is not abnormal for patient   Self Care: Did the patient need help bathing, dressing, using the toilet or eating?  Dependent   Indoor Mobility: Did the patient need assistance with walking from room to room (with or without device)? Dependent   Stairs: Did the patient need assistance with internal or external stairs (with or without device)? Dependent   Functional Cognition: Did the patient need help planning regular tasks such as shopping or remembering to take medications? Dependent   Home Assistive Devices / Equipment Home Assistive Devices/Equipment: [employer] board Home Equipment: Walker - 4 wheels,Cane - single  point,Bedside commode,Shower seat,Wheelchair - manual,Grab bars - tub/shower,Transport chair,Hospital bed,Other (comment) (prosthetic)   Prior Device Use: Indicate devices/aids used by the patient prior to current illness, exacerbation or injury? Manual wheelchair   Current Functional Level Cognition   Arousal/Alertness: Awake/alert Overall Cognitive Status: History of cognitive impairments - at baseline Orientation Level: Oriented X4 Following Commands: Follows one step commands with increased time Safety/Judgement: Decreased awareness of safety,Decreased awareness of deficits General Comments: Pt with poor awareness of deficits, displaying possible R side neglect as he needed reminders to attend to R side and look to R. Attention: Sustained Sustained Attention: Appears intact Memory: Impaired Memory Impairment: Decreased recall of new information Awareness: Impaired Awareness Impairment: Anticipatory impairment Problem Solving: Impaired Problem Solving Impairment: Verbal basic Safety/Judgment: Impaired    Extremity Assessment (includes Sensation/Coordination)   Upper Extremity Assessment: Generalized weakness,LUE deficits/detail (wife reports arms are close to baseline and L UE has hx of "frozen shoulder") LUE Deficits / Details: hx of frozen shoulder arom elevation to 50 degrees  Lower Extremity Assessment: Defer to PT evaluation,RLE deficits/detail RLE Deficits / Details: noted to have wound lateral aspect behind knee cap and just medially that are PTA per wife and only two wounds left to heal. Pt noted to  have redness and warm space right at the patella. wife states "i am so worried about that. it does not look good"     ADLs   Overall ADL's : Needs assistance/impaired Eating/Feeding: Maximal assistance,Bed level Eating/Feeding Details (indicate cue type and reason): positioned for lunch tray with pillows in chair position General ADL Comments: total (A) for all adls at this  time. pt would benefit from hoyer lift only due to risk for falls and skin break down     Mobility   Overal bed mobility: Needs Assistance Bed Mobility: Rolling,Sidelying to Sit,Sit to Supine Rolling: Min assist,Mod assist Sidelying to sit: Mod assist,+2 for physical assistance Sit to supine: Mod assist,+2 for physical assistance General bed mobility comments: Min-modA for rolling bilaterally for truncal and LE translation and cues to reach across body for contralateral rail. Management of legs and trunk to transition to sit and back to supine L EOB with modAx2. Pt forgetting to manage R side towards EOB to square with EOB upon coming to sit, needing cues to attend to R side.     Transfers   Overall transfer level: Needs assistance Transfers: Lateral/Scoot Transfers  Lateral/Scoot Transfers: Max assist,+2 physical assistance,+2 safety/equipment General transfer comment: MaxAx2 with cues for L hand on L HOB rail and R hand on bed to push up to clear buttocks for lateral scoot to L, 3x. Pt not tolerating L knee block.     Ambulation / Gait / Stairs / Wheelchair Mobility   Ambulation/Gait General Gait Details: unable     Posture / Balance Dynamic Sitting Balance Sitting balance - Comments: fair to poor, requiring min PT assist for posterior support with fatigue Balance Overall balance assessment: Needs assistance Sitting-balance support: Bilateral upper extremity supported,Feet supported Sitting balance-Leahy Scale: Poor Sitting balance - Comments: fair to poor, requiring min PT assist for posterior support with fatigue Postural control: Posterior lean Standing balance comment: unable     Special needs/care consideration     Meryle Ready, RN  Registered Nurse  WOC  Consult Note       Signed    Date of Service:  05/25/2020 11:09 AM                 Signed            Show:Clear all [x] ?Manual[x] ?Template[x] ?Copied   Added by: [x] ?Meryle Ready, RN     [] ?Hover for  details   WOC Nurse Consult Note: Patient receiving care in [facility] 4N16. Spouse, daughter, and primary RN present at time of my assessment. Reason for Consult: right BKA stump care Wound type: NO wounds present Pressure Injury POA: Yes/No/NA Measurement: Wound bed: Drainage (amount, consistency, odor)  Periwound: Dressing procedure/placement/frequency: The following care instructions were entered:   For the right stump, follow these instructions explicitly: ONLY the wife will wash the right leg from the knee down, and she will use the lotion she has to moisturize the area. Elevate the leg on a pillow to prevent the stump from resting on the mattress. Do NOT place a foam dressing on the stump. The patient has decided he does NOT want to wear the stump sock shrinker provided by Dr. [name]. AND, MONITOR THE PINK SPOT ON THE KNEE AREA FOR of these develop.   Thank you for the consult.  Discussed plan of care with the patient and bedside nurse.  [place] nurse will not follow at this time.  Please re-consult the [facility] team if needed.   Val Riles, RN,  MSN, CWOCN, CNS-BC, pager [phone]              Note Details   Author Meryle Ready, RN File Time 05/25/2020 11:11 AM  Author Type Registered Nurse Status Signed  Last Editor Meryle Ready, RN Service [place] # [id number] Admit Date 05/23/2020         Previous Home Environment  Living Arrangements: Spouse/significant other  Lives With: Spouse Available Help at Discharge: Family,Available 24 hours/day Type of Home: House Home Layout: One level Home Access: Ramped entrance Bathroom Shower/Tub: Chiropodist: Handicapped height Bathroom Accessibility: Yes [place]: Yes [place] (if known): [place] Additional Comments: transfer board   Discharge Living Setting Plans for Discharge Living Setting: Patient's home,Lives with (comment) (wife) Type of Home at Discharge: House Discharge Home Layout:  One level Discharge Home Access: [place] entrance Discharge Bathroom Shower/Tub: [place] unit Discharge Bathroom Toilet: Handicapped height Discharge Bathroom Accessibility: Yes How Accessible: Accessible via wheelchair Does the patient have any problems obtaining your medications?: No   Social/Family/Support Systems Contact Information: wife, [name] Anticipated Caregiver: wife and I have recommended hired assist beyond [place]: see above Ability/Limitations of Caregiver: none Caregiver Availability: 24/7 Discharge Plan Discussed with Primary Caregiver: Yes Is Caregiver In Agreement with Plan?: Yes Does Caregiver/Family have Issues with Lodging/Transportation while Pt is in Rehab?: No   Goals Patient/Family Goal for Rehab: min assist PT and OT at wheelchair level with slide board transfers, supervision with SLP Expected length of stay: [facility] 16 to 18 days Additional Information: Patient is a retired Software engineer Pt/Family Agrees to Admission and willing to participate: Yes Program Orientation Provided & Reviewed with Pt/Caregiver Including Roles  & Responsibilities: Yes   Decrease burden of Care through IP rehab admission: n/a   Possible need for SNF placement upon discharge:not anticipated   Patient Condition: This patient's condition remains as documented in the consult dated 05/25/2020, in which the Rehabilitation Physician determined and documented that the patient's condition is appropriate for intensive rehabilitative care in an inpatient rehabilitation facility. Will admit to inpatient rehab today.   Preadmission Screen Completed By:  Cleatrice Burke, RN, 05/26/2020 10:27 AM ______________________________________________________________________   Discussed status with Dr. Dagoberto Ligas on 05/26/2020 at 1034 and received approval for admission today.   Admission Coordinator:  Cleatrice Burke, time 6144 Date  05/26/2020             Cosigned by: Courtney Heys, MD at 05/26/2020 12:05 PM    Revision History                    Note Details  Author Robert Gong, RN File Time 05/26/2020 10:34 AM  Author Type Rehab Admission Coordinator Status Signed  Last Editor Robert Gong, RN Service Physical Medicine and [place] # [id number] Admit Date 05/26/2020

## 2020-05-27 NOTE — Progress Notes (Signed)
Inpatient [facility] Individual Statement of Services  Patient Name:  [name]  Date:  05/27/2020  Welcome to the [facility].  Our goal is to provide you with an individualized program based on your diagnosis and situation, designed to meet your specific needs.  With this comprehensive rehabilitation program, you will be expected to participate in at least 3 hours of rehabilitation therapies Monday-Friday, with modified therapy programming on the weekends.  Your rehabilitation program will include the following services:  Physical Therapy (PT), Occupational Therapy (OT), Speech Therapy (ST), 24 hour per day rehabilitation nursing, Neuropsychology, Care Coordinator, Rehabilitation Medicine, Nutrition Services and Pharmacy Services  Weekly team conferences will be held on Tuesdayto discuss your progress.  Your Inpatient Rehabilitation Care Coordinator will talk with you frequently to get your input and to update you on team discussions.  Team conferences with you and your family in attendance may also be held.  Expected length of stay: 17-21 days  Overall anticipated outcome: supervision-min level  Depending on your progress and recovery, your program may change. Your Inpatient Rehabilitation Care Coordinator will coordinate services and will keep you informed of any changes. Your Inpatient Rehabilitation Care Coordinator's name and contact numbers are listed  below.  The following services may also be recommended but are not provided by the [facility]:    [place] will be made to provide these services after discharge if needed.  Arrangements include referral to agencies that provide these services.  Your insurance has been verified to be:  [employer] Your primary doctor is:  [name]  Pertinent information will be shared with your doctor and  your insurance company.  Inpatient Rehabilitation Care Coordinator:  Ovidio Kin, [place] or Emilia Beck  Information discussed with and copy given to patient by: Elease Hashimoto, 05/27/2020, 10:09 AM

## 2020-05-27 NOTE — Evaluation (Signed)
Occupational Therapy Assessment and Plan  Patient Details  Name: [name] MRN: [id number] Date of Birth: November 15, 1933  OT Diagnosis: abnormal posture, acute pain, apraxia, cognitive deficits, disturbance of vision, hemiplegia affecting dominant side, lumbago (low back pain) and muscle weakness (generalized) Rehab Potential: Rehab Potential (ACUTE ONLY): Fair ELOS: 18-21 days   Today's Date: 05/27/2020 OT Individual Time: 9833-8250 OT Individual Time Calculation (min): 60 min     Hospital Problem: Principal Problem:   Acute right hemiparesis (HCC) Active Problems:   Chronic diastolic (congestive) heart failure (HCC)   Anemia of chronic disease   Chronic atrial fibrillation   S/P BKA (below knee amputation) unilateral, right (HCC)   Stroke (cerebrum) ([place])   Past Medical History:  Past Medical History:  Diagnosis Date  . Abnormality of gait 09/22/2015  . Arthritis   . Atrial fibrillation ([place])   . CAD (coronary artery disease)    Stent to RCA, Penta stent, 99% reduced to 0% 2002.  . Cancer ([place])    skin CA removed from back  . 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 Chronic insomnia 03/30/2015  . Chronic kidney disease, stage 3 ([place])   . Complication of anesthesia    trouble waking up  . GERD (gastroesophageal reflux disease)   . Hypercholesteremia   . Hypertension   . Hypothyroidism   . Iron deficiency anemia   . Memory difficulty 09/22/2015  . Osteoarthritis   . PAD (peripheral artery disease) ([place])   . Pneumonia   . Seizures ([place])   . Sepsis ([place]) 05/2017  . Transient alteration of awareness 03/30/2015  . Vertigo    hx of  . Vitamin D deficiency    Past Surgical History:  Past Surgical History:  Procedure Laterality Date  . AMPUTATION Right 03/28/2018   Procedure: AMPUTATION BELOW KNEE;  Surgeon: Newt Minion, MD;  Location: [facility];  Service: Orthopedics;  Laterality: Right;  . APPLICATION OF WOUND VAC Right 01/23/2019   Procedure:  Application Of  Prevena Wound Vac;  Surgeon: Newt Minion, MD;  Location: [facility];  Service: Orthopedics;  Laterality: Right;  . BACK SURGERY    . BIOPSY  01/08/2020   Procedure: BIOPSY;  Surgeon: Gatha Mayer, MD;  Location: [facility];  Service: Endoscopy;;  . ESOPHAGOGASTRODUODENOSCOPY (EGD) WITH PROPOFOL N/A 01/08/2020   Procedure: ESOPHAGOGASTRODUODENOSCOPY (EGD) WITH PROPOFOL;  Surgeon: Gatha Mayer, MD;  Location: [facility];  Service: Endoscopy;  Laterality: N/A;  . EYE SURGERY     Bilateral Cataract surgery   . HERNIA REPAIR    . I & D EXTREMITY Right 05/10/2015   Procedure: IRRIGATION AND DEBRIDEMENT EXTREMITY;  Surgeon: Leanora Cover, MD;  Location: [facility];  Service: Orthopedics;  Laterality: Right;  . KNEE ARTHROPLASTY     right knee X 2; left knee once  . LAMINECTOMY     X 6  . LEG AMPUTATION BELOW KNEE Right 03/28/2018  . POSTERIOR CERVICAL FUSION/FORAMINOTOMY  01/28/2012   Procedure: POSTERIOR CERVICAL FUSION/FORAMINOTOMY LEVEL 3;  Surgeon: [name], MD;  Location: [facility];  Service: Neurosurgery;  Laterality: Left;  Posterior Cervical Five-Thoracic One Fusion, Arthrodesis with LEFT Cervical Seven-thoracic One Laminectomy, Foraminotomy and Resection of Synovial Cyst  . POSTERIOR CERVICAL FUSION/FORAMINOTOMY N/A 01/29/2013   Procedure: POSTERIOR CERVICAL FUSION/FORAMINOTOMY LEVEL 1 and C2-5 Posteriolateral Arthrodesis;  Surgeon: [name], MD;  Location: [facility];  Service: Neurosurgery;  Laterality: N/A;  C2-C3 Laminectomy C2-C3 posterior cervical  arthrodesis  . STUMP REVISION Right 01/23/2019   Procedure: REVISION RIGHT BELOW KNEE AMPUTATION;  Surgeon: Newt Minion, MD;  Location: [facility];  Service: Orthopedics;  Laterality: Right;  . TONSILLECTOMY      Assessment & Plan Clinical Impression: Patient is a 87 y.o. right-handed male with history of atrial fibrillation who had been on Eliquis in the past but self DC'd due to  leg hematoma, CAD with stenting maintained on aspirin, diastolic congestive heart failure, CKD stage III, hypertension, rheumatoid arthritis maintained on chronic prednisone per rheumatology services Dr. Jeanett Schlein, hyperlipidemia, seizure disorder maintained on Depakote followed by Dr. 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  Per chart review patient lives with spouse.  He is a retired Software engineer.  1 level home with ramped entrance.  Patient uses transfer board in and out of wheelchair he no longer uses his prosthesis due to skin issues.  Wife assist with bathing and dressing as well as pericare.  Presented 05/23/2020 with right side weakness left gaze preference and inability to speak.  Cranial CT scan negative for acute changes.  CT angiogram of head and neck negative for emergent large vessel occlusion.  Left MCA branches appeared stable from 2020 CTA.  Patient did receive TPA.  EEG negative for seizure but suggestive of mild to moderate diffuse encephalopathy.  MRI showed patchy abnormal diffusion at the left perirolandic cortex most resembling acute ischemia.  No substantial edema or mass-effect in that region.  Echocardiogram with ejection fraction of 55 to 60% no wall motion abnormalities.  Maintained on Pradaxa for CVA prophylaxis.  Admission chemistries unremarkable except glucose 153 BUN 36 creatinine 1.39 hemoglobin 10.5 valproic acid level 41.  Maintain on a dysphagia #1 nectar thick liquid diet.  Therapy evaluations completed due to patient decreased functional mobility was admitted for a comprehensive rehab program.  Patient transferred to CIR on 05/26/2020 .    Patient currently requires total with basic self-care skills secondary to muscle weakness, decreased cardiorespiratoy endurance, impaired timing and sequencing, unbalanced muscle activation, motor apraxia, decreased coordination and decreased motor planning, decreased  visual perceptual skills and decreased visual motor skills, decreased midline orientation and decreased attention to right, decreased attention, decreased awareness, decreased problem solving, decreased safety awareness, decreased memory and delayed processing and decreased sitting balance, decreased postural control and hemiplegia.  Prior to hospitalization, patient could complete ADLs with wife providing max assist for bathing and mod-max assist dressing.  Completed slide board transfers with assistance, able to complete grooming with setup..  Patient will benefit from skilled intervention to decrease level of assist with basic self-care skills prior to discharge home with care partner.  Anticipate patient will require moderate physical assestance and follow up home health.  OT - End of Session Activity Tolerance: Tolerates 10 - 20 min activity with multiple rests Endurance Deficit: Yes Endurance Deficit Description: requires increased time for all tasks, falling asleep throughout session OT Assessment Rehab Potential (ACUTE ONLY): Fair OT Barriers to Discharge: Incontinence OT Patient demonstrates impairments in the following area(s): Balance;Cognition;Endurance;Motor;Pain;Perception;Safety;Sensory;Skin Integrity;Vision OT Basic ADL's Functional Problem(s): Eating;Grooming;Bathing;Dressing;Toileting OT Transfers Functional Problem(s): Toilet OT Additional Impairment(s): Fuctional Use of Upper Extremity OT Plan OT Intensity: Minimum of 1-2 x/day, 45 to 90 minutes OT Frequency: 5 out of 7 days OT Duration/Estimated Length of Stay: 18-21 days OT Treatment/Interventions: Balance/vestibular training;Cognitive remediation/compensation;Discharge planning;Disease mangement/prevention;DME/adaptive equipment instruction;Functional mobility training;Functional electrical stimulation;Neuromuscular re-education;Pain management;Patient/family education;Psychosocial support;Self Care/advanced ADL  retraining;Skin care/wound managment;Splinting/orthotics;Therapeutic  Activities;Therapeutic Exercise;UE/LE Strength taining/ROM;UE/LE Coordination activities;Visual/perceptual remediation/compensation;Wheelchair propulsion/positioning OT Self Feeding Anticipated Outcome(s): Supervision OT Basic Self-Care Anticipated Outcome(s): Mod-max assist OT Toileting Anticipated Outcome(s): Max assist OT Bathroom Transfers Anticipated Outcome(s): Mod assist OT Recommendation Recommendations for Other Services: Neuropsych consult Patient destination: Home Follow Up Recommendations: Home health OT;24 hour supervision/assistance Equipment Recommended: Other (comment) (drop arm BSC)   OT Evaluation Precautions/Restrictions  Precautions Precautions: Fall Precaution Comments: very thin skin, skin tears easily with multiple sites of skin breakdown; R neglect General   Vital Signs Therapy Vitals Temp: 98.1 F (36.7 C) Temp Source: Oral Pulse Rate: 85 Resp: 16 BP: 129/60 Patient Position (if appropriate): Lying Oxygen Therapy SpO2: 100 % Pain Pain Assessment Pain Scale: 0-10 Pain Score: Asleep Pain Type: Chronic pain Pain Location: Leg Pain Orientation: Lower Pain Intervention(s): RN made aware Home Living/Prior [place] expects to be discharged to:: Private residence Living Arrangements: Spouse/significant other Available Help at Discharge: Family,Available 24 hours/day Type of Home: House Home Access: Ramped entrance Home Layout: One level Bathroom Shower/Tub: Multimedia programmer: Handicapped height Bathroom Accessibility: Yes Additional Comments: used slide board for transfers  Lives With: Spouse IADL History Homemaking Responsibilities: No Prior Function Level of Independence: Requires assistive device for independence,Needs assistance with ADLs Bath: Maximal Toileting: Total Dressing: Maximal Grooming: Supervision/set-up  Able to  Take Stairs?: No Vocation: Retired Surveyor, mining Baseline Vision/History: Wears glasses Wears Glasses: At all times Vision Assessment?: Vision impaired- to be further tested in functional context;Yes Ocular Range of Motion: Restricted on the right Alignment/Gaze Preference: Gaze left Tracking/Visual Pursuits: Decreased smoothness of vertical tracking;Decreased smoothness of horizontal tracking;Requires cues, head turns, or add eye shifts to track;Unable to hold eye position out of midline Saccades: Impaired - to be further tested in functional context Visual Fields: Impaired-to be further tested in functional context Perception  Perception: Impaired Praxis Praxis: Impaired Praxis Impairment Details: Motor planning Cognition Overall Cognitive Status: History of cognitive impairments - at baseline Arousal/Alertness: Awake/alert Orientation Level: Person;Place;Situation Person: Oriented Place: Oriented Situation: Oriented Year:  (states "I don't know") Month:  (states "I dont' know") Day of Week:  (states "I don't know") Memory: Impaired Memory Impairment: Decreased recall of new information;Decreased short term memory Immediate Memory Recall: Sock;Bed;Blue Memory Recall Sock: Not able to recall Memory Recall Blue: Not able to recall Memory Recall Bed: Not able to recall Attention: Sustained Sustained Attention: Appears intact Awareness: Impaired Problem Solving: Impaired Safety/Judgment: Impaired Sensation Sensation Light Touch: Impaired by gross assessment Proprioception: Impaired by gross assessment Coordination Gross Motor Movements are Fluid and Coordinated: No Fine Motor Movements are Fluid and Coordinated: No Coordination and Movement Description: grossly uncoordinated due to fatigue, generalized weakness, R BKA, R hemiparesis, decreased balance/postural control, and decreased endurance. Finger Nose Finger Test: decreased bilaterally and difficulty staying awake. Motor   Motor Motor: Hemiplegia;Abnormal postural alignment and control Motor - Skilled Clinical Observations: grossly uncoordinated due to fatigue, generalized weakness, R BKA, R hemiparesis, decreased balance/postural control, and decreased endurance.  Trunk/Postural Assessment  Cervical Assessment Cervical Assessment: Exceptions to [facility] (forward head) Thoracic Assessment Thoracic Assessment: Exceptions to [facility]) Lumbar Assessment Lumbar Assessment: Exceptions to [facility] (posterior pelvic tilt) Postural Control Postural Control: Deficits on evaluation  Balance Balance Balance Assessed: Yes Static Sitting Balance Static Sitting - Balance Support: Feet supported;Bilateral upper extremity supported Static Sitting - Level of Assistance: 5: Stand by assistance (CGA) Dynamic Sitting Balance Dynamic Sitting - Balance Support: Feet supported;Bilateral upper extremity supported Dynamic Sitting - Level of Assistance: 4: Min assist  Extremity/Trunk Assessment RUE Assessment RUE Assessment: Exceptions to [facility] Passive Range of Motion (PROM) Comments: shoulder flexion to ~140 degrees, elbow flexion/extension WNL Active Range of Motion (AROM) Comments: shoulder flexion ~90 degrees, elbow flexion/extension WNL, decreased finger flexion/extension and opposition General Strength Comments: grossly 3/5 overall LUE Assessment LUE Assessment: Exceptions to [facility] Passive Range of Motion (PROM) Comments: WNL Active Range of Motion (AROM) Comments: shoulder flexion ~120 degrees General Strength Comments: grossly 3+/5 to 4/5  Care Tool Care Tool Self Care Eating   Eating Assist Level: Dependent - Patient 0%    Oral Care         Bathing              Upper Body Dressing(including orthotics)      Max assist      Lower Body Dressing (excluding footwear)     Assist for lower body dressing: Total Assistance - Patient < 25%    Putting on/Taking off footwear   What is the patient wearing?:  [place] for footwear: Dependent - Patient 0%       Care Tool Toileting Toileting activity   Assist for toileting: Dependent - Patient 0% (incont)     Care Tool Bed Mobility Roll left and right activity   Roll left and right assist level: Maximal Assistance - Patient 25 - 49% Roll left and right assistive device comment: bedrails  Sit to lying activity   Sit to lying assist level: Minimal Assistance - Patient > 75%    Lying to sitting edge of bed activity   Lying to sitting edge of bed assist level: Maximal Assistance - Patient 25 - 49%     Care Tool Transfers Sit to stand transfer Sit to stand activity did not occur: Safety/medical concerns (fatigue, generalized weakness, decreased balance)      Chair/bed transfer   Chair/bed transfer assist level: 2 Armed forces training and education officer transfer activity did not occur: Safety/medical concerns       Care Tool Cognition Expression of Ideas and Wants Expression of Ideas and Wants: Frequent difficulty - frequently exhibits difficulty with expressing needs and ideas   Understanding Verbal and Non-Verbal Content Understanding Verbal and Non-Verbal Content: Sometimes understands - understands only basic conversations or simple, direct phrases. Frequently requires cues to understand   Memory/Recall Ability *first 3 days only Memory/Recall Ability *first 3 days only: That he or she is in a hospital/hospital unit    Refer to Care Plan for [place] 1 OT Short Term Goal 1 (Week 1): Pt will be able to engage in UB bathing seated EOB with min assist for sitting balance OT Short Term Goal 2 (Week 1): Pt will engage in UB dressing with mod assist OT Short Term Goal 3 (Week 1): Pt will roll R and L in bed with mod assist to allow caregiver to complete LB dressing OT Short Term Goal 4 (Week 1): Pt will complete slide board transfer with max assist of 1 caregiver  Recommendations for other services:  Neuropsych   Skilled Therapeutic Intervention OT eval completed with discussion of rehab process, OT purpose, POC, ELOS, and goals.  ADL assessment completed at bed level with focus on initiation, motor planning, and participation in self-care tasks.  Pt's wife present throughout session, providing PLOF and home set up due to pt fatigue, decreased communication, and energy conservation.  Pt falling asleep frequently throughout session.  Pt's wife provided max assist with bathing at  bed level PTA and completed LB dressing for pt.  Pt's wife donned shrinker sock and compression sock with increased time due to reports of thin and sensitive skin.  Pt engaged in vision and UE ROM assessment requiring increased time and cues due to decreased initiation, sequencing, and overall fatigue.  RN present to administer pain meds and morning meds.  Pt requiring increased time and cues for attention, swallowing, and clearing of pocketing.  Pt declined any dressing at this time. Pt's wife expressed desire for pt to remain upright in bed due to high risk of aspiration.  RN notified of pt position and request for LB dressing prior to PT session.   ADL ADL Eating: Dependent Where Assessed-Eating: Bed level Grooming: Maximal assistance Where Assessed-Grooming: Bed level Upper Body Bathing: Dependent Where Assessed-Upper Body Bathing: Bed level Lower Body Bathing: Dependent Where Assessed-Lower Body Bathing: Bed level Upper Body Dressing: Not assessed Lower Body Dressing: Not assessed Toileting: Dependent Mobility  Bed Mobility Bed Mobility: Rolling Right;Rolling Left;Left Sidelying to Sit;Sit to Supine Rolling Right: Maximal Assistance - Patient 25-49% Rolling Left: Moderate Assistance - Patient 50-74% Left Sidelying to Sit: Maximal Assistance - Patient 25-49% Sit to Supine: Minimal Assistance - Patient > 75%   Discharge Criteria: Patient will be discharged from OT if patient refuses treatment 3 consecutive  times without medical reason, if treatment goals not met, if there is a change in medical status, if patient makes no progress towards goals or if patient is discharged from hospital.  The above assessment, treatment plan, treatment alternatives and goals were discussed and mutually agreed upon: by patient and by family  Ellwood Dense [facility] 05/27/2020, 12:22 PM

## 2020-05-27 NOTE — Evaluation (Signed)
Speech Language Pathology Assessment and Plan  Patient Details  Name: [name] MRN: [id number] Date of Birth: 1933-07-29  SLP Diagnosis: Dysarthria;Speech and Language deficits;Dysphagia;Cognitive Impairments  Rehab Potential: Good ELOS: 2-3 weeks    Today's Date: 05/27/2020 SLP Individual Time: 0177-9390 SLP Individual Time Calculation (min): 45 min   Hospital Problem: Principal Problem:   Acute right hemiparesis (HCC) Active Problems:   Chronic diastolic (congestive) heart failure (HCC)   Anemia of chronic disease   Chronic atrial fibrillation   S/P BKA (below knee amputation) unilateral, right ([place])   Stroke (cerebrum) (HCC)  Past Medical History:  Past Medical History:  Diagnosis Date  . Abnormality of gait 09/22/2015  . Arthritis   . Atrial fibrillation ([place])   . CAD (coronary artery disease)    Stent to RCA, Penta stent, 99% reduced to 0% 2002.  . Cancer ([place])    skin CA removed from back  . 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 Chronic insomnia 03/30/2015  . Chronic kidney disease, stage 3 ([place])   . Complication of anesthesia    trouble waking up  . GERD (gastroesophageal reflux disease)   . Hypercholesteremia   . Hypertension   . Hypothyroidism   . Iron deficiency anemia   . Memory difficulty 09/22/2015  . Osteoarthritis   . PAD (peripheral artery disease) ([place])   . Pneumonia   . Seizures ([place])   . Sepsis ([place]) 05/2017  . Transient alteration of awareness 03/30/2015  . Vertigo    hx of  . Vitamin D deficiency    Past Surgical History:  Past Surgical History:  Procedure Laterality Date  . AMPUTATION Right 03/28/2018   Procedure: AMPUTATION BELOW KNEE;  Surgeon: Newt Minion, MD;  Location: [facility];  Service: Orthopedics;  Laterality: Right;  . APPLICATION OF WOUND VAC Right 01/23/2019   Procedure: Application Of  Prevena Wound Vac;  Surgeon: Newt Minion, MD;  Location: [facility];  Service: Orthopedics;  Laterality: Right;  .  BACK SURGERY    . BIOPSY  01/08/2020   Procedure: BIOPSY;  Surgeon: Gatha Mayer, MD;  Location: [facility];  Service: Endoscopy;;  . ESOPHAGOGASTRODUODENOSCOPY (EGD) WITH PROPOFOL N/A 01/08/2020   Procedure: ESOPHAGOGASTRODUODENOSCOPY (EGD) WITH PROPOFOL;  Surgeon: Gatha Mayer, MD;  Location: [facility];  Service: Endoscopy;  Laterality: N/A;  . EYE SURGERY     Bilateral Cataract surgery   . HERNIA REPAIR    . I & D EXTREMITY Right 05/10/2015   Procedure: IRRIGATION AND DEBRIDEMENT EXTREMITY;  Surgeon: Leanora Cover, MD;  Location: [facility];  Service: Orthopedics;  Laterality: Right;  . KNEE ARTHROPLASTY     right knee X 2; left knee once  . LAMINECTOMY     X 6  . LEG AMPUTATION BELOW KNEE Right 03/28/2018  . POSTERIOR CERVICAL FUSION/FORAMINOTOMY  01/28/2012   Procedure: POSTERIOR CERVICAL FUSION/FORAMINOTOMY LEVEL 3;  Surgeon: [name], MD;  Location: [facility];  Service: Neurosurgery;  Laterality: Left;  Posterior Cervical Five-Thoracic One Fusion, Arthrodesis with LEFT Cervical Seven-thoracic One Laminectomy, Foraminotomy and Resection of Synovial Cyst  . POSTERIOR CERVICAL FUSION/FORAMINOTOMY N/A 01/29/2013   Procedure: POSTERIOR CERVICAL FUSION/FORAMINOTOMY LEVEL 1 and C2-5 Posteriolateral Arthrodesis;  Surgeon: [name], MD;  Location: [facility];  Service: Neurosurgery;  Laterality: N/A;  C2-C3 Laminectomy C2-C3 posterior cervical arthrodesis  . STUMP REVISION Right 01/23/2019   Procedure: REVISION RIGHT BELOW KNEE AMPUTATION;  Surgeon: Newt Minion, MD;  Location: [facility];  Service: Orthopedics;  Laterality: Right;  . TONSILLECTOMY      Assessment / Plan / Recommendation  [name] is an 87 year old right-handed male with history of atrial fibrillation who had been on Eliquis in the past but self DC'd due to leg hematoma, CAD with stenting maintained on aspirin, diastolic congestive heart failure, CKD stage III, hypertension,  rheumatoid arthritis maintained on chronic prednisone per rheumatology services Dr. Jeanett Schlein, hyperlipidemia, seizure disorder maintained on Depakote followed by Dr. 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  Per chart review patient lives with spouse.  He is a retired Software engineer.  1 level home with ramped entrance.  Patient uses transfer board in and out of wheelchair he no longer uses his prosthesis due to skin issues.  Wife assist with bathing and dressing as well as pericare.  Presented 05/23/2020 with right side weakness left gaze preference and inability to speak.  Cranial CT scan negative for acute changes.  CT angiogram of head and neck negative for emergent large vessel occlusion.  Left MCA branches appeared stable from 2020 CTA.  Patient did receive TPA.  EEG negative for seizure but suggestive of mild to moderate diffuse encephalopathy.  MRI showed patchy abnormal diffusion at the left perirolandic cortex most resembling acute ischemia.  No substantial edema or mass-effect in that region.  Echocardiogram with ejection fraction of 55 to 60% no wall motion abnormalities.  Maintained on Pradaxa for CVA prophylaxis.  Admission chemistries unremarkable except glucose 153 BUN 36 creatinine 1.39 hemoglobin 10.5 valproic acid level 41.  Maintain on a dysphagia #1 nectar thick liquid diet.  Therapy evaluations completed due to patient decreased functional mobility was admitted for a comprehensive rehab program.  Clinical Impression Patient was very lethargic as it was in PM and patient had already had a few hours of therapy and evaluations. SLP did spend time as well speaking with his wife to help establish baseline and determine current needs. Bedside swallow not completed as patient was not able to maintain alertness appropriately for this and plan to complete this next visit (Will likely need MBS). Evaluation limited by patient's fatigue  and lethargy, but he appears to be exhibiting a moderate dysarthria, mild cognitive impairment and mild to moderate expressive language impairment. He does have baseline deficits from previous hospitalizations. He had was oriented to self, place but not time or situation. He was not able to tell how many years he and wife had been married (had 25th anniversary last year). Dysarthria appeared primarily secondary to right sided labial weakness and lingual weakness and decreased lingual ROM. Patient will benefit from skilled SLP intervention to focus on dysphagia goals, speech, expressive language and basic level cognitive goals to improve his ability to communicate basic wants and needs, determine safest PO intake and to perform basic ADL's with caregiver support.  Skilled Therapeutic Interventions          Cognitive-linguistic and speech evaluation  SLP Assessment  Patient will need skilled Speech Lanaguage Pathology Services during CIR admission    Recommendations  SLP Diet Recommendations: Dysphagia 1 (Puree);Nectar Liquid Administration via: Cup Medication Administration: Crushed with puree Supervision: Staff to assist with self feeding;Full supervision/cueing for compensatory strategies Compensations: Slow rate;Small sips/bites;Multiple dry swallows after each bite/sip;Lingual sweep for clearance of pocketing Postural Changes and/or Swallow Maneuvers: Seated upright 90 degrees;Upright 30-60 min after meal Oral Care Recommendations: Oral care BID;Staff/trained caregiver to provide oral care  Patient destination: Home Follow up Recommendations: Home Health SLP Equipment Recommended: None recommended by SLP    SLP Frequency 3 to 5 out of 7 days   SLP Duration  SLP Intensity  SLP Treatment/Interventions 2-3 weeks  Minumum of 1-2 x/day, 30 to 90 minutes  Dysphagia/aspiration precaution training;Cognitive remediation/compensation;Functional tasks;Patient/family education;Therapeutic  Activities;Speech/Language facilitation    Pain Pain Assessment Pain Scale: Faces Faces Pain Scale: No hurt  Prior Functioning Cognitive/Linguistic Baseline: Baseline deficits Baseline deficit details: wife has been caregiver and helps patient with all ADL's Type of Home: House  Lives With: Spouse Available Help at Discharge: Family;Available 24 hours/day Vocation: Retired Warehouse manager for 30 years)  SLP Evaluation Cognition Overall Cognitive Status: Difficult to assess Arousal/Alertness: Awake/alert Orientation Level: Oriented to person;Oriented to place;Disoriented to time Attention: Focused Focused Attention: Impaired Focused Attention Impairment: Verbal basic;Functional basic Memory: Impaired Memory Impairment: Storage deficit;Retrieval deficit;Decreased recall of new information Awareness: Impaired Awareness Impairment: Emergent impairment Problem Solving: Impaired Problem Solving Impairment: Verbal basic;Functional basic Safety/Judgment: Impaired  Comprehension Auditory Comprehension Overall Auditory Comprehension: Appears within functional limits for tasks assessed Expression Expression Primary Mode of Expression: Verbal Verbal Expression Overall Verbal Expression: Impaired Initiation: No impairment Level of Generative/Spontaneous Verbalization: Phrase Pragmatics: No impairment Interfering Components: Attention;Speech intelligibility Non-Verbal Means of Communication: Not applicable Written Expression Dominant Hand: Right Oral Motor Oral Motor/Sensory Function Overall Oral Motor/Sensory Function: Mild impairment Facial ROM: Reduced right;Suspected CN VII (facial) dysfunction Facial Strength: Reduced right;Suspected CN VII (facial) dysfunction Facial Sensation: Reduced right;Suspected CN V (Trigeminal) dysfunction Lingual Strength: Reduced Motor Speech Overall Motor Speech: Impaired Respiration: Within functional limits Level of Impairment:  Phrase Phonation: Low vocal intensity Resonance: Within functional limits Articulation: Impaired Level of Impairment: Phrase Intelligibility: Intelligibility reduced Word: 50-74% accurate Phrase: 50-74% accurate Sentence: Not tested Conversation: Not tested Motor Planning: Witnin functional limits Motor Speech Errors: Not applicable  Care Tool Care Tool Cognition Expression of Ideas and Wants Expression of Ideas and Wants: Frequent difficulty - frequently exhibits difficulty with expressing needs and ideas   Understanding Verbal and Non-Verbal Content Understanding Verbal and Non-Verbal Content: Sometimes understands - understands only basic conversations or simple, direct phrases. Frequently requires cues to understand   Memory/Recall Ability *first 3 days only Memory/Recall Ability *first 3 days only: That he or she is in a hospital/hospital unit;Current season     PMSV Assessment  PMSV Trial Intelligibility: Intelligibility reduced Word: 50-74% accurate Phrase: 50-74% accurate Sentence: Not tested Conversation: Not tested  Short Term Goals: Week 1: SLP Short Term Goal 1 (Week 1): Patient will participate in BSE to determine need for MBS. SLP Short Term Goal 2 (Week 1): Patient will achieve approximately 80% intelligibility at 3-4 word phrase level with modA SLP Short Term Goal 3 (Week 1): Patient will name objects and verb pictures/photos with minA. SLP Short Term Goal 4 (Week 1): Patient will consume current diet Dys 1, nectar liquids with minimal s/s aspiration, penetration SLP Short Term Goal 5 (Week 1): Patient will recall and describe daily events at basic/general level with modA.  Refer to Care Plan for Long Term Goals  Recommendations for other services: None   Discharge Criteria: Patient will be discharged from SLP if patient refuses treatment 3 consecutive times without medical reason, if treatment goals not met, if there is a change in medical status, if patient  makes no progress towards goals or if patient is discharged from hospital.  The above assessment, treatment plan, treatment alternatives and goals were discussed and mutually agreed upon: by  patient and by family  Sonia Baller, MA, [facility]

## 2020-05-27 NOTE — Progress Notes (Signed)
Patient is a patient of Dr. Jess Barters that is been followed for right below-knee amputation recurrent ulcers and skin breakdown.  His wife called our office yesterday requesting recommendations for continued care of his below-knee amputation.   Wife is at bedside currently stump has no dressing on it.  There is no ascending cellulitis no open ulcers he has a small amount of drainage from the distal end of the stump but there is no surrounding fluctuance no cellulitis no foul odor drainage is serous  Wife will bring in his extra-large shrinkers that he should be changed daily with antibacterial cleansing of the leg with soap and water

## 2020-05-27 NOTE — TOC Benefit Eligibility Note (Signed)
Patient Teacher, English as a foreign language completed.    The patient is currently admitted and upon discharge could be taking Pradaxa 150 mg.  The current 30 day co-pay is, $225.14.   The patient is insured through [place], [place] Patient Advocate Specialist [place] Team Direct Number: [phone]  Fax: [phone]

## 2020-05-27 NOTE — Progress Notes (Signed)
Occupational Therapy Session Note  Patient Details  Name: [name] MRN: [id number] Date of Birth: 03-08-1934  Today's Date: 05/27/2020 OT Individual Time: 8756-4332 OT Individual Time Calculation (min): 39 min    Short Term Goals: Week 1:  OT Short Term Goal 1 (Week 1): Pt will be able to engage in UB bathing seated EOB with min assist for sitting balance OT Short Term Goal 2 (Week 1): Pt will engage in UB dressing with mod assist OT Short Term Goal 3 (Week 1): Pt will roll R and L in bed with mod assist to allow caregiver to complete LB dressing OT Short Term Goal 4 (Week 1): Pt will complete slide board transfer with max assist of 1 caregiver  Skilled Therapeutic Interventions/Progress Updates:    Treatment session with focus on self-feeding.  Pt received upright in bed with food tray in front of pt.  Pt's wife reporting pt recently having begun eating and not wanting assist with self-feeding.  Therapist observed pt with self-feeding, pt initially only using LUE with scooping food and requiring more than reasonable amount of time for setup and scooping of food.  Therapist provided hand over hand assist to set up utensil in R hand.  Pt successfully scooped 2 bites and brought to mouth and then pt dropped spoon.  Therapist obtained built up handle for spoon.  Pt then able to demonstrate increased sustained grasp on utensil.  Discussed use of built up handle and the possibility of attempting other utensils - some with additional weight for improved feedback.  Pt falling asleep between bites requiring cues for arousal and attention to task.  Pt then stating that he was done and attempted to push tray away.  Discussed therapy schedule and for pt to rest before SLP evaluation.  Pt remained semi-reclined in bed falling asleep upon therapist exit.  Therapy Documentation Precautions:  Precautions Precautions: Fall Precaution Comments: very thin skin, skin tears easily with multiple sites of skin  breakdown; R neglect Restrictions Weight Bearing Restrictions: No General:   Vital Signs: Therapy Vitals Temp: 98.4 F (36.9 C) Temp Source: Oral Pulse Rate: 73 Resp: 16 BP: 106/73 Patient Position (if appropriate): Lying Oxygen Therapy SpO2: 100 % O2 Device: Room Air Pain:  Pt with no c/o pain   Therapy/Group: Individual Therapy  Simonne Come 05/27/2020, 3:16 PM

## 2020-05-27 NOTE — Progress Notes (Signed)
Inpatient Rehabilitation Care Coordinator Assessment and Plan Patient Details  Name: [name] MRN: [id number] Date of Birth: 1933/07/12  Today's Date: 05/27/2020  Hospital Problems: Principal Problem:   Acute right hemiparesis ([place]) Active Problems:   Chronic diastolic (congestive) heart failure (HCC)   Anemia of chronic disease   Chronic atrial fibrillation   S/P BKA (below knee amputation) unilateral, right ([place])   Stroke (cerebrum) ([place])  Past Medical History:  Past Medical History:  Diagnosis Date  . Abnormality of gait 09/22/2015  . Arthritis   . Atrial fibrillation ([place])   . CAD (coronary artery disease)    Stent to RCA, Penta stent, 99% reduced to 0% 2002.  . Cancer ([place])    skin CA removed from back  . 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 Chronic insomnia 03/30/2015  . Chronic kidney disease, stage 3 ([place])   . Complication of anesthesia    trouble waking up  . GERD (gastroesophageal reflux disease)   . Hypercholesteremia   . Hypertension   . Hypothyroidism   . Iron deficiency anemia   . Memory difficulty 09/22/2015  . Osteoarthritis   . PAD (peripheral artery disease) ([place])   . Pneumonia   . Seizures ([place])   . Sepsis ([place]) 05/2017  . Transient alteration of awareness 03/30/2015  . Vertigo    hx of  . Vitamin D deficiency    Past Surgical History:  Past Surgical History:  Procedure Laterality Date  . AMPUTATION Right 03/28/2018   Procedure: AMPUTATION BELOW KNEE;  Surgeon: Newt Minion, MD;  Location: [facility];  Service: Orthopedics;  Laterality: Right;  . APPLICATION OF WOUND VAC Right 01/23/2019   Procedure: Application Of  Prevena Wound Vac;  Surgeon: Newt Minion, MD;  Location: [facility];  Service: Orthopedics;  Laterality: Right;  . BACK SURGERY    . BIOPSY  01/08/2020   Procedure: BIOPSY;  Surgeon: Gatha Mayer, MD;  Location: [facility];  Service: Endoscopy;;  . ESOPHAGOGASTRODUODENOSCOPY (EGD) WITH PROPOFOL N/A  01/08/2020   Procedure: ESOPHAGOGASTRODUODENOSCOPY (EGD) WITH PROPOFOL;  Surgeon: Gatha Mayer, MD;  Location: [facility];  Service: Endoscopy;  Laterality: N/A;  . EYE SURGERY     Bilateral Cataract surgery   . HERNIA REPAIR    . I & D EXTREMITY Right 05/10/2015   Procedure: IRRIGATION AND DEBRIDEMENT EXTREMITY;  Surgeon: Leanora Cover, MD;  Location: [facility];  Service: Orthopedics;  Laterality: Right;  . KNEE ARTHROPLASTY     right knee X 2; left knee once  . LAMINECTOMY     X 6  . LEG AMPUTATION BELOW KNEE Right 03/28/2018  . POSTERIOR CERVICAL FUSION/FORAMINOTOMY  01/28/2012   Procedure: POSTERIOR CERVICAL FUSION/FORAMINOTOMY LEVEL 3;  Surgeon: [name], MD;  Location: [facility];  Service: Neurosurgery;  Laterality: Left;  Posterior Cervical Five-Thoracic One Fusion, Arthrodesis with LEFT Cervical Seven-thoracic One Laminectomy, Foraminotomy and Resection of Synovial Cyst  . POSTERIOR CERVICAL FUSION/FORAMINOTOMY N/A 01/29/2013   Procedure: POSTERIOR CERVICAL FUSION/FORAMINOTOMY LEVEL 1 and C2-5 Posteriolateral Arthrodesis;  Surgeon: [name], MD;  Location: [facility];  Service: Neurosurgery;  Laterality: N/A;  C2-C3 Laminectomy C2-C3 posterior cervical arthrodesis  . STUMP REVISION Right 01/23/2019   Procedure: REVISION RIGHT BELOW KNEE AMPUTATION;  Surgeon: Newt Minion, MD;  Location: [facility];  Service: Orthopedics;  Laterality: Right;  . TONSILLECTOMY     Social History:  reports that he has quit smoking. He has a  5.00 pack-year smoking history. He has never used smokeless tobacco. He reports previous alcohol use. He reports that he does not use drugs.  Family / Support Systems Marital Status: Married Patient Roles: Spouse,Other (Comment) (retiree) Spouse/Significant Other: [name]-home  [phone] Other Supports: Some church friends call to check on them Anticipated Caregiver: Wife Ability/Limitations of Caregiver: Wife  has been caring for pt prior to admission with ADL's, pericare and transfers. Wife has had neck surgery in the past Caregiver Availability: 24/7 Family Dynamics: Both pt and wife are close and have been together a long time and been through m a lot together. Wife is very stoic and does a great deal for pt and may need some assist with this now.  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 She is here daily and participating with him   Abuse/Neglect Abuse/Neglect Assessment Can Be Completed: Yes Physical Abuse: Denies Verbal Abuse: Denies Sexual Abuse: Denies Exploitation of patient/patient's resources: Denies Self-Neglect: Denies  Emotional Status Pt's affect, behavior and adjustment status: Pt tried his best and knows he is much care for his wife and truly appreciates her and how much she does for him. He will do what he can with his health issues in rehab. Recent Psychosocial Issues: multiple medical issues-BKA, CHF,CAD, Seizures, multiple back surgeries Psychiatric History: No history deferred depression screening due to pt adjusting to rehab. Will ask team if feels appropriate to see neuro-psych while here, this worker feels it would be beneficial Substance Abuse History: No issues  Patient / Family Perceptions, Expectations & Goals Pt/Family understanding of illness & functional limitations: Pt seems to have a basic understanding of his condition here, wife is very well versed and asking MD questions and knows what to expect and manages pt's medical needs. She is a strong support of him and  will advocate if needed for him. She is very hands on and directs pt's care Premorbid pt/family roles/activities: Husband, retiree, church member, etc Anticipated changes in roles/activities/participation: resume Pt/family expectations/goals: Pt states: " I will try to do in therapies."  Wife states: " He has a lot going on and needs rest breaks. I will help him."  Community Resources Premorbid Home Care/DME Agencies: Other (Comment) (Had [facility] in the past and wc, tf board, bsc, tub seat, hospital bed, transport chair) Transportation available at discharge: Wife and has used [facility] in the past for transport Resource referrals recommended: Neuropsychology  Discharge Planning Living Arrangements: Spouse/significant other Support Systems: Spouse/significant other,Church/faith community Type of Residence: Private residence Insurance Resources: [employer] (specify) Nurse, mental health) Financial Resources: Scientist, physiological (Comment) Financial Screen Referred: No Living Expenses: Own Money Management: Spouse Does the patient have any problems obtaining your medications?: No Home Management: Wife Patient/Family Preliminary Plans: Return home with wife assisting with his care, she was prior to admission. HOpefully can get back to baseline and be able to assist some with his own care at discharge. Wife has been providing much care for pt and wonder the toll it is tkaing on her. Aware of private duty agencies and will have home health again at discharge. Care Coordinator Anticipated Follow Up Needs: HH/OP  Clinical Impression Unfortunante gentleman who has multiple health issues  and was wheelchair bound prior to admission. Wife provide 24/7 care and will continue to do this at discharge. Pt has been to SNF's in the past wife plans on taking him home. Will discuss if hoyer would be beneifical or a stedy to assist her with his care. Will await therapy evaluations and work on discharge plan. Wife  directs his care and is very involved in pt's care.  Elease Hashimoto 05/27/2020, 10:06 AM

## 2020-05-27 NOTE — IPOC Note (Signed)
Overall Plan of Care [facility]) Patient Details Name: [name] MRN: [id number] DOB: 1933/05/01  Admitting Diagnosis: Acute right hemiparesis [facility])  Hospital Problems: Principal Problem:   Acute right hemiparesis ([place]) Active Problems:   Chronic diastolic (congestive) heart failure (HCC)   Anemia of chronic disease   Chronic atrial fibrillation   S/P BKA (below knee amputation) unilateral, right (HCC)   Stroke (cerebrum) ([place])     Functional Problem List: Nursing Behavior,Bladder,Bowel,Edema,Endurance,Medication Management,Pain,Safety,Sensory,Skin Integrity  PT Balance,Endurance,Motor,Perception,Safety,Sensory,Skin Integrity  OT Balance,Cognition,Endurance,Motor,Pain,Perception,Safety,Sensory,Skin Integrity,Vision  SLP Cognition,Motor,Linguistic  TR         Basic ADL's: OT Eating,Grooming,Bathing,Dressing,Toileting     Advanced  ADL's: OT       Transfers: PT Bed Mobility,Bed to [employer]  OT Toilet     Locomotion: PT Ambulation,Wheelchair Mobility,Stairs     Additional Impairments: OT Fuctional Use of Upper Extremity  SLP Swallowing,Communication expression    TR      Anticipated Outcomes Item Anticipated Outcome  Self Feeding Supervision  Swallowing  supervision   Basic self-care  Mod-max assist  Toileting  Max assist   Bathroom Transfers Mod assist  Bowel/Bladder  Min assist  Transfers  CGA with LRAD  Locomotion  N/A  Communication  minA  Cognition  minA  Pain  < 4  Safety/Judgment  Min assist   Therapy Plan: PT Intensity: Minimum of 1-2 x/day ,45 to 90 minutes PT Frequency: 5 out of 7 days PT Duration Estimated Length of Stay: 2 weeks OT Intensity: Minimum of 1-2 x/day, 45 to 90 minutes OT Frequency: 5 out of 7 days OT Duration/Estimated Length of Stay: 18-21 days SLP Intensity: Minumum of 1-2 x/day, 30 to 90 minutes SLP Frequency: 3 to 5 out of 7 days SLP Duration/Estimated Length of Stay: 2-3 weeks   Due to the current  state of emergency, patients may not be receiving their 3-hours of Medicare-mandated therapy.   Team Interventions: Nursing Interventions Patient/Family Education,Bladder Management,Bowel Management,Disease Management/Prevention,Pain Management,Medication Management,Skin Care/Wound Management,Discharge Planning,Psychosocial Support  PT interventions Ambulation/gait training,Discharge planning,Functional mobility training,Psychosocial support,Therapeutic Activities,Visual/perceptual remediation/compensation,Balance/vestibular training,Disease management/prevention,Neuromuscular re-education,Skin care/wound Heritage manager propulsion/positioning,Cognitive remediation/compensation,DME/adaptive equipment instruction,Pain management,Splinting/orthotics,UE/LE Strength taining/ROM,Community reintegration,Patient/family education,Stair training,UE/LE Coordination activities  OT Interventions Balance/vestibular training,Cognitive remediation/compensation,Discharge planning,Disease Pharmacologist instruction,Functional mobility training,Functional Financial trader re-education,Pain management,Patient/family education,Psychosocial support,Self Care/advanced ADL retraining,Skin care/wound managment,Splinting/orthotics,Therapeutic Activities,Therapeutic Exercise,UE/LE Strength taining/ROM,UE/LE Coordination activities,Visual/perceptual remediation/compensation,Wheelchair propulsion/positioning  SLP Interventions Dysphagia/aspiration precaution training,Cognitive remediation/compensation,Functional tasks,Patient/family education,Therapeutic Activities,Speech/Language facilitation  TR Interventions    SW/CM Interventions Discharge Planning,Psychosocial Support,Patient/Family Education   Barriers to Discharge MD  Medical stability  Nursing Inaccessible home environment,Decreased caregiver support,Home environment access/layout,Incontinence,Wound  Care,Lack of/limited family support,Weight bearing restrictions,Medication compliance,Behavior,Nutrition means    PT Wound Care wounds on R residual limb and poor activity tolerance  OT Incontinence    SLP Decreased caregiver support wife has been his primary caregiver but she likely will need some additional help  SW       Team Discharge Planning: Destination: PT-Home ,OT- Home , SLP-Home Projected Follow-up: PT-Home health PT, OT-  Home health OT,24 hour supervision/assistance, SLP-Home Health SLP Projected Equipment Needs: PT-To be determined, OT- Other (comment) (drop arm BSC), SLP-None recommended by SLP Equipment Details: PT-has slideboard and WC, OT-  Patient/family involved in discharge planning: PT- Patient,Family member/caregiver,  OT-Patient,Family member/caregiver, SLP-Patient,Family member/caregiver  MD ELOS: 12-16 days Medical Rehab Prognosis:  Excellent Assessment: [name] is an 87 year old man admitted to CIR With right upper extremity paresis with dysarthria and dysphagia secondary to left MCA distribution infarction. Conditions being managed include  dysphagia, atrial fibrillation, CAD, seizure disorder, hypothyroidism, hx of R BKA, HLD, diastolic congestive heart failure, RA, and anemia.    See Team Conference Notes for weekly updates to the plan of care

## 2020-05-28 DIAGNOSIS — Z89511 Acquired absence of right leg below knee: Secondary | ICD-10-CM

## 2020-05-28 DIAGNOSIS — I5032 Chronic diastolic (congestive) heart failure: Secondary | ICD-10-CM

## 2020-05-28 DIAGNOSIS — I10 Essential (primary) hypertension: Secondary | ICD-10-CM

## 2020-05-28 MED ORDER — ACETAMINOPHEN 500 MG PO TABS
500.0000 mg | ORAL_TABLET | Freq: Three times a day (TID) | ORAL | Status: DC
Start: 2020-05-28 — End: 2020-05-30
  Administered 2020-05-28 – 2020-05-29 (×4): 500 mg via ORAL
  Filled 2020-05-28 (×4): qty 1

## 2020-05-28 NOTE — Progress Notes (Signed)
Physical Therapy Session Note  Patient Details  Name: [name] MRN: [id number] Date of Birth: 09-22-1933  Today's Date: 05/28/2020 PT Individual Time: 1000-1045 PT Individual Time Calculation (min): 45 min   Short Term Goals: Week 1:  PT Short Term Goal 1 (Week 1): pt will roll L and R with min A PT Short Term Goal 2 (Week 1): pt will transfer bed<>WC with LRAD and mod A of 1 PT Short Term Goal 3 (Week 1): pt will perform WC mobility 2ft with supervision  Skilled Therapeutic Interventions/Progress Updates:   Pt received supine in bed, wife stating pt is having extreme LBP, no number give to rate pain and PT returned in 30 minutes. No additional complaints of pain. Rolling R and L to don pants at bed level with min-mod assist for roll and total A for clothing management. Supine>sit with mod-max Assist from PT for safety. SB transfer to [facility] with mod assist and moderate cues for UE placement and improved use of LE to limit sheer forces on gluteal surface.  UE and LE therex. Chest press with AAROM and 2# bar weight. AAROM shoulder flexion/elbow extension x 12 BUE. LAQ x 12 BLE with AAROM on the LLE. Hip abduction x 12, hip flexion x 10, hip extension from flexed position x 15. Pt returned to room and performed SB transfer to bed with max assist. Sit>supine completed with mod assist, and left supine in bed with call bell in reach and all needs met.       Therapy Documentation Precautions:  Precautions Precautions: Fall Precaution Comments: very thin skin, skin tears easily with multiple sites of skin breakdown; R neglect Restrictions Weight Bearing Restrictions: No General: PT Amount of Missed Time (min): 30 Minutes PT Missed Treatment Reason: Pain    Therapy/Group: Individual Therapy  Lorie Phenix 05/28/2020, 6:46 PM

## 2020-05-28 NOTE — Plan of Care (Signed)
  Problem: Consults Goal: RH STROKE PATIENT EDUCATION Description: See Patient Education module for education specifics  Outcome: Progressing   Problem: RH BOWEL ELIMINATION Goal: RH STG MANAGE BOWEL WITH ASSISTANCE Description: STG Manage Bowel with Sinking Spring. Outcome: Progressing   Problem: RH BLADDER ELIMINATION Goal: RH STG MANAGE BLADDER WITH ASSISTANCE Description: STG Manage Bladder With min Assistance Outcome: Progressing   Problem: RH SKIN INTEGRITY Goal: RH STG MAINTAIN SKIN INTEGRITY WITH ASSISTANCE Description: STG Maintain Skin Integrity With min Assistance. Outcome: Progressing   Problem: RH SAFETY Goal: RH STG ADHERE TO SAFETY PRECAUTIONS W/ASSISTANCE/DEVICE Description: STG Adhere to Safety Precautions With min Assistance/Device. Outcome: Progressing

## 2020-05-28 NOTE — Progress Notes (Signed)
Occupational Therapy Session Note  Patient Details  Name: [name] MRN: [id number] Date of Birth: [birth year]  Today's Date: 05/28/2020 OT Individual Time: 1103-1200 OT Individual Time Calculation (min): 57 min   Short Term Goals: Week 1:  OT Short Term Goal 1 (Week 1): Pt will be able to engage in UB bathing seated EOB with min assist for sitting balance OT Short Term Goal 2 (Week 1): Pt will engage in UB dressing with mod assist OT Short Term Goal 3 (Week 1): Pt will roll R and L in bed with mod assist to allow caregiver to complete LB dressing OT Short Term Goal 4 (Week 1): Pt will complete slide board transfer with max assist of 1 caregiver  Skilled Therapeutic Interventions/Progress Updates:    Pt greeted in bed and per RN, pt premedicated for back pain. Pts son was present to observe during session. Supine<sit completed with Min A today with HOB elevated using the bedrail. Worked on postural control and sitting balance by engaging in multiple UB self care and grooming tasks. Pt required verbal, manual, and tactile cues for offsetting Lt leaning posture. Noted Lt lateral and forward flexed positioning of cervical spine. Pt needed to utilize trunk rotation to scan to the Rt side for ADL items. Noted dysmetria in Lt and Rt UE during functional reaching, ?deficits with proprioceptive and/or coordination. Per son, pt wears reading glasses and pt states he can see fine, ?visual involvement as well and pt would benefit from additional assessment. Pt often kept his Rt arm extended on bed to keep his balance, Total A for UB bathing/dressing and multiple grooming tasks including lotion application to dry skin. Pt returned to bed with Min A and was boosted up in bed with +2 assist. Left him with all needs within reach and bed alarm set.   Therapy Documentation Precautions:  Precautions Precautions: Fall Precaution Comments: very thin skin, skin tears easily with multiple sites of skin breakdown;  R neglect Restrictions Weight Bearing Restrictions: No Vital Signs: Therapy Vitals Temp: 97.6 F (36.4 C) Temp Source: Oral Pulse Rate: 67 Resp: 16 BP: (!) 97/59 Patient Position (if appropriate): Sitting Oxygen Therapy SpO2: 100 % O2 Device: Room Air ADL: ADL Eating: Dependent Where Assessed-Eating: Bed level Grooming: Maximal assistance Where Assessed-Grooming: Bed level Upper Body Bathing: Dependent Where Assessed-Upper Body Bathing: Bed level Lower Body Bathing: Dependent Where Assessed-Lower Body Bathing: Bed level Upper Body Dressing: Not assessed Lower Body Dressing: Not assessed Toileting: Dependent      Therapy/Group: Individual Therapy  Kassie Keng A Jasan Doughtie 05/28/2020, 4:06 PM

## 2020-05-28 NOTE — Progress Notes (Signed)
PROGRESS NOTE   Subjective/Complaints: Pt complains of generalized pain. Indicates some of it is from right BK. After we moved his pillow under the leg it seemed a little better. Wife was concerned this morning because he was groaning, but not doing it now.   ROS: limited due to language/communication    Objective:   No results found. Recent Labs    05/26/20 0432  WBC 6.8  HGB 9.7*  HCT 30.3*  PLT 127*   Recent Labs    05/26/20 0432  NA 142  K 3.7  CL 110  CO2 21*  GLUCOSE 77  BUN 20  CREATININE 1.15  CALCIUM 8.1*    Intake/Output Summary (Last 24 hours) at 05/28/2020 0904 Last data filed at 05/28/2020 0700 Gross per 24 hour  Intake 240 ml  Output --  Net 240 ml        Physical Exam: Vital Signs Blood pressure 127/72, pulse 96, temperature 98.7 F (37.1 C), temperature source Oral, resp. rate 20, height 5\' 9"  (1.753 m), weight 65.2 kg, SpO2 99 %. Constitutional: No distress . Vital signs reviewed. HEENT: EOMI, oral membranes moist Neck: supple Cardiovascular: RRR without murmur. No JVD    Respiratory/Chest: CTA Bilaterally without wheezes or rales. Normal effort    GI/Abdomen: BS +, non-tender, non-distended Ext: no clubbing, cyanosis, or edema Psych: flat but cooperative Skin: intact. Sl drainage from RBKA, bruising both arms. Neuro: Alert and oriented x3. Musculoskeletal:     Cervical back: Normal range of motion and neck supple.     Comments: RUE- 3/5 in proximal muscles- 3+/5 distally--stable LUE- 4+/5 in same muscles tested RLE- 3/5 in HF/KE/KF- has R BKA LLE- 4 to 4+/5 in LLE- in HF/KE/DF and PF  R BKA- with foam dressing. Well shaped without edema. Some tenderness to touch. Skin:    Comments: Right BKA with dry dressing   Neurological:     Comments: Patient is alert.  Makes eye contact with examiner.  expressive language deficits. dsyarthria.  He did have difficulty naming objects but  was able to provide his name.  Follows some simple commands. Intact to light touch in UEs and LLE/R BKA       Assessment/Plan: 1. Functional deficits which require 3+ hours per day of interdisciplinary therapy in a comprehensive inpatient rehab setting.  Physiatrist is providing close team supervision and 24 hour management of active medical problems listed below.  Physiatrist and rehab team continue to assess barriers to discharge/monitor patient progress toward functional and medical goals  Care Tool:  Bathing              Bathing assist Assist Level: Maximal Assistance - Patient 24 - 49%     Upper Body Dressing/Undressing Upper body dressing        Upper body assist Assist Level: Maximal Assistance - Patient 25 - 49%    Lower Body Dressing/Undressing Lower body dressing      What is the patient wearing?: Incontinence brief,Pants     Lower body assist Assist for lower body dressing: Total Assistance - Patient < 25%     Toileting Toileting    Toileting assist Assist for toileting: Dependent - Patient  0%     Transfers Chair/bed transfer  Transfers assist     Chair/bed transfer assist level: 2 Helpers     Locomotion Ambulation   Ambulation assist   Ambulation activity did not occur: Safety/medical concerns          Walk 10 feet activity   Assist  Walk 10 feet activity did not occur: Safety/medical concerns (fatigue, generalized weakness, decreased balance)        Walk 50 feet activity   Assist Walk 50 feet with 2 turns activity did not occur: Safety/medical concerns (fatigue, generalized weakness, decreased balance)         Walk 150 feet activity   Assist Walk 150 feet activity did not occur: Safety/medical concerns (fatigue, generalized weakness, decreased balance)         Walk 10 feet on uneven surface  activity   Assist Walk 10 feet on uneven surfaces activity did not occur: Safety/medical concerns (fatigue,  generalized weakness, decreased balance)         Wheelchair     Assist Will patient use wheelchair at discharge?: Yes Type of Wheelchair: Manual    Wheelchair assist level: Supervision/Verbal cueing Max wheelchair distance: 79ft    Wheelchair 50 feet with 2 turns activity    Assist        Assist Level: Total Assistance - Patient < 25%   Wheelchair 150 feet activity     Assist      Assist Level: Dependent - Patient 0%   Blood pressure 127/72, pulse 96, temperature 98.7 F (37.1 C), temperature source Oral, resp. rate 20, height 5\' 9"  (1.753 m), weight 65.2 kg, SpO2 99 %.    Medical Problem List and Plan: 1.  Right upper extremity paresis with dysarthria and dysphagia secondary to left MCA distribution infarction             -patient may  Shower with dressing covered on R BKA             -ELOS/Goals: w/c level/transfers min A- 16-18 days  -Continue CIR PT, OT, SLP 2.  Antithrombotics: -DVT/anticoagulation: Continue Pradaxa-discussed cost with wife and they will be able to afford.              -antiplatelet therapy: N/A 3. Pain Management: Continue Hydrocodone as needed   -continue massage right stump  -will schedule tylenol 500mg  tid  -pain complaints vague 3/19 4. Mood: Provide emotional support             -antipsychotic agents: N/A 5. Neuropsych: This patient is not capable of making decisions on his own behalf. 6. Skin/Wound Care: Routine skin checks 7. Fluids/Electrolytes/Nutrition: Routine in and outs with follow-up chemistries 8.  Dysphagia 1.  Dysphagia #1 nectar thick liquid diet follow-up speech therapy  -encourage PO 9.  Atrial fibrillation.  Diltiazem 240 mg daily.  Cardiac rate controlled 10.  CAD with stenting.  Continue Pradaxa.  No chest pain or shortness of breath 11.  CKD stage III.  Baseline creatinine 1.39-1.60.  Follow-up chemistries 12.  Seizure disorder.  Depakote 500 mg twice daily 13.  History of right BKA 03/28/2018 with  revision 01/23/2019.  Continue shrinker.  -ortho has seen wound 14.  Hypothyroidism.  Synthroid 15.  Hypertension.  Cardura 4 mg nightly.  Continue Lopressor 25 mg twice daily  3/19 controlled 16  GERD.  Protonix 17.  Hyperlipidemia.  Lipitor 18.  Diastolic congestive heart failure.  Lasix 20 mg daily.  Monitor for any signs of fluid overload  Filed Weights   05/26/20 1500  Weight: 65.2 kg    -need daily weights 19.  Rheumatoid arthritis.  Continue chronic prednisone 20. L AC fossa IV d/c'd- active bleeding x10+ minutes- pressure applied to area yesterday. No active bleeding today 21. Anemia: Hgb 10.2-->9.7: check labs monday   LOS: 2 days A FACE TO FACE EVALUATION WAS PERFORMED  Meredith Staggers 05/28/2020, 9:04 AM

## 2020-05-28 NOTE — Progress Notes (Signed)
Occupational Therapy Session Note  Patient Details  Name: [name] MRN: [id number] Date of Birth: 01-Apr-1933  Today's Date: 05/28/2020 OT Individual Time: 6122-4001 OT Individual Time Calculation (min): 55 min    Short Term Goals: Week 1:  OT Short Term Goal 1 (Week 1): Pt will be able to engage in UB bathing seated EOB with min assist for sitting balance OT Short Term Goal 2 (Week 1): Pt will engage in UB dressing with mod assist OT Short Term Goal 3 (Week 1): Pt will roll R and L in bed with mod assist to allow caregiver to complete LB dressing OT Short Term Goal 4 (Week 1): Pt will complete slide board transfer with max assist of 1 caregiver  Skilled Therapeutic Interventions/Progress Updates:    Pt received semi-reclined in bed starting breakfast with wife present, agreeable to therapy. Indicates pain in R residual limb, unable to quantify, MD present for assessment and aware. Session focus on upright sitting tolerance, RUE NMR, R inattention, self-feeding, and caregiver education. Readjusted pt in bed with + 2 to scoot up to improve upright posture. Pt declined getting dressed this morning. Preferred items placed on R side of plate to increase R attention, pt able to indicate with his LUE which item he would like to try next, with mod VCs/visual cues, pt acknowledged items on R side of tray, but only able to reach to retrieve items from midline this date. Pt req max encouragement to self-feed, using primarily LUE with built up handle on spoon to bring food to mouth and req assist to cut up items. Pt able to drink from juice with BUE with ~25% spillage noted. Overall, req mod VCs to decrease R pocketing, encourage swallowing between bites, and to alternate liquids/solids per SLP reccomendations. Pt still req use of suction to fully clear R pocketing at end of session. Pt able to suction himself with min A for initiation with RUE. Pt's wife educated on self-feeding strategies, energy  conservation, RUE NMR, and stroke education. Reports her main goals for rehab stay are for pt "to be able to do as much as possible for himself and to communicate more."    Pt left semi-reclined in bed with bed alarm engaged, wife present, call bell in reach, and all immediate needs met.    Therapy Documentation Precautions:  Precautions Precautions: Fall Precaution Comments: very thin skin, skin tears easily with multiple sites of skin breakdown; R neglect Restrictions Weight Bearing Restrictions: No Pain:   R residual limb pain, did not quantify, MD present and aware ADL: See Care Tool for more details.   Therapy/Group: Individual Therapy  Volanda Napoleon MS, OTR/L  05/28/2020, 2:58 PM

## 2020-05-29 ENCOUNTER — Inpatient Hospital Stay (HOSPITAL_COMMUNITY): Payer: Medicare Other

## 2020-05-29 DIAGNOSIS — R1312 Dysphagia, oropharyngeal phase: Secondary | ICD-10-CM

## 2020-05-29 DIAGNOSIS — R5383 Other fatigue: Secondary | ICD-10-CM

## 2020-05-29 DIAGNOSIS — I6300 Cerebral infarction due to thrombosis of unspecified precerebral artery: Secondary | ICD-10-CM

## 2020-05-29 LAB — BASIC METABOLIC PANEL
Anion gap: 11 (ref 5–15)
BUN: 27 mg/dL — ABNORMAL HIGH (ref 8–23)
CO2: 21 mmol/L — ABNORMAL LOW (ref 22–32)
Calcium: 7.7 mg/dL — ABNORMAL LOW (ref 8.9–10.3)
Chloride: 109 mmol/L (ref 98–111)
Creatinine, Ser: 1.45 mg/dL — ABNORMAL HIGH (ref 0.61–1.24)
GFR, Estimated: 48 mL/min — ABNORMAL LOW (ref >60)
Glucose, Bld: 99 mg/dL (ref 70–99)
Potassium: 3.2 mmol/L — ABNORMAL LOW (ref 3.5–5.1)
Sodium: 141 mmol/L (ref 135–145)

## 2020-05-29 LAB — CBC
HCT: 30.6 % — ABNORMAL LOW (ref 39.0–52.0)
Hemoglobin: 9.8 g/dL — ABNORMAL LOW (ref 13.0–17.0)
MCH: 32.7 pg (ref 26.0–34.0)
MCHC: 32 g/dL (ref 30.0–36.0)
MCV: 102 fL — ABNORMAL HIGH (ref 80.0–100.0)
Platelets: 131 10*3/uL — ABNORMAL LOW (ref 150–400)
RBC: 3 MIL/uL — ABNORMAL LOW (ref 4.22–5.81)
RDW: 16.5 % — ABNORMAL HIGH (ref 11.5–15.5)
WBC: 12.7 10*3/uL — ABNORMAL HIGH (ref 4.0–10.5)
nRBC: 0 % (ref 0.0–0.2)

## 2020-05-29 LAB — VALPROIC ACID LEVEL: Valproic Acid Lvl: 90 ug/mL (ref 50.0–100.0)

## 2020-05-29 LAB — PREALBUMIN: Prealbumin: 15.3 mg/dL — ABNORMAL LOW (ref 18–38)

## 2020-05-29 MED ORDER — ACETAMINOPHEN 650 MG RE SUPP: 650.0000 mg | RECTAL | Status: DC | PRN

## 2020-05-29 MED ORDER — LIDOCAINE 5 % EX PTCH
3.0000 | MEDICATED_PATCH | CUTANEOUS | Status: DC
  Administered 2020-05-29 – 2020-06-02 (×3): 3 via TRANSDERMAL
  Filled 2020-05-29 (×6): qty 3

## 2020-05-29 MED ORDER — MUSCLE RUB 10-15 % EX CREA
TOPICAL_CREAM | CUTANEOUS | Status: DC | PRN
  Filled 2020-05-29: qty 85

## 2020-05-29 MED ORDER — DIVALPROEX SODIUM 125 MG PO CSDR: 500.0000 mg | DELAYED_RELEASE_CAPSULE | Freq: Two times a day (BID) | ORAL | Status: DC

## 2020-05-29 MED ORDER — PIPERACILLIN-TAZOBACTAM 3.375 G IVPB
3.3750 g | Freq: Three times a day (TID) | INTRAVENOUS | Status: DC
  Administered 2020-05-29 – 2020-06-01 (×8): 3.375 g via INTRAVENOUS
  Filled 2020-05-29 (×10): qty 50

## 2020-05-29 MED ORDER — POTASSIUM CHLORIDE IN NACL 20-0.45 MEQ/L-% IV SOLN
INTRAVENOUS | Status: DC
  Filled 2020-05-29 (×4): qty 1000

## 2020-05-29 NOTE — Progress Notes (Addendum)
PROGRESS NOTE   Subjective/Complaints: Wife states he didn't have a very good night. Was uncomfortable, "moaning" frequently. Pt indicates that his back might have been the problem. Also some intermittent coughing noted. Received hydrocodone early this morning, now groggy this morning.  Addendum: still more lethargic later in the morning.   ROS: limited due to language/communication   Objective:   No results found. No results for input(s): WBC, HGB, HCT, PLT in the last 72 hours. No results for input(s): NA, K, CL, CO2, GLUCOSE, BUN, CREATININE, CALCIUM in the last 72 hours.  Intake/Output Summary (Last 24 hours) at 05/29/2020 0843 Last data filed at 05/28/2020 1801 Gross per 24 hour  Intake 330 ml  Output --  Net 330 ml        Physical Exam: Vital Signs Blood pressure (!) 147/52, pulse (!) 109, temperature 98.2 F (36.8 C), resp. rate 18, height 5\' 9"  (1.753 m), weight 65.2 kg, SpO2 94 %. Constitutional: No distress . Vital signs reviewed. HEENT: EOMI, oral membranes dry, but mouth breathing Neck: supple Cardiovascular: tachy without murmur. No JVD    Respiratory/Chest: CTA Bilaterally without wheezes or rales. Normal effort    GI/Abdomen: BS +, non-tender, non-distended Ext: no clubbing, cyanosis, or edema Psych: flat, attempts to cooperate Skin: arm bruising. Right bk site inspected and only tiny residual open areas. No visible drainage.  Neuro: Alert and oriented x3. Musculoskeletal: LB tender with palpation. ROM. Did not seem to have much tenderness in right BK limb. Limb well shaped, shrinker sock    Cervical back: Normal range of motion and neck supple.     Comments: RUE- 3/5 in proximal muscles- 3+/5 distally--inconcistent LUE- 4+/5 inconsistent effort RLE- 3/5 in HF/KE/KF- has R BKA LLE- 4 to 4+/5 in LLE- in HF/KE/DF and PF  Neurological:     Comments: Patient is lethargic this morning.  Makes eye contact  with examiner.  expressive language deficits. dsyarthria.  follows simple commands. Sensation Intact to light touch in UEs and LLE/R BKA       Assessment/Plan: 1. Functional deficits which require 3+ hours per day of interdisciplinary therapy in a comprehensive inpatient rehab setting.  Physiatrist is providing close team supervision and 24 hour management of active medical problems listed below.  Physiatrist and rehab team continue to assess barriers to discharge/monitor patient progress toward functional and medical goals  Care Tool:  Bathing        Body parts bathed by helper: Right arm,Left arm,Abdomen,Chest     Bathing assist Assist Level: Total Assistance - Patient < 25% (pt refusing to engage in LB bathing/dressing today)     Upper Body Dressing/Undressing Upper body dressing        Upper body assist Assist Level: Maximal Assistance - Patient 25 - 49%    Lower Body Dressing/Undressing Lower body dressing      What is the patient wearing?: Incontinence brief,Pants     Lower body assist Assist for lower body dressing: Total Assistance - Patient < 25%     Toileting Toileting    Toileting assist Assist for toileting: Dependent - Patient 0%     Transfers Chair/bed transfer  Transfers assist  Chair/bed transfer assist level: 2 Helpers     Locomotion Ambulation   Ambulation assist   Ambulation activity did not occur: Safety/medical concerns          Walk 10 feet activity   Assist  Walk 10 feet activity did not occur: Safety/medical concerns (fatigue, generalized weakness, decreased balance)        Walk 50 feet activity   Assist Walk 50 feet with 2 turns activity did not occur: Safety/medical concerns (fatigue, generalized weakness, decreased balance)         Walk 150 feet activity   Assist Walk 150 feet activity did not occur: Safety/medical concerns (fatigue, generalized weakness, decreased balance)         Walk 10 feet  on uneven surface  activity   Assist Walk 10 feet on uneven surfaces activity did not occur: Safety/medical concerns (fatigue, generalized weakness, decreased balance)         Wheelchair     Assist Will patient use wheelchair at discharge?: Yes Type of Wheelchair: Manual    Wheelchair assist level: Supervision/Verbal cueing Max wheelchair distance: 36ft    Wheelchair 50 feet with 2 turns activity    Assist        Assist Level: Total Assistance - Patient < 25%   Wheelchair 150 feet activity     Assist      Assist Level: Dependent - Patient 0%   Blood pressure (!) 147/52, pulse (!) 109, temperature 98.2 F (36.8 C), resp. rate 18, height 5\' 9"  (1.753 m), weight 65.2 kg, SpO2 94 %.    Medical Problem List and Plan: 1.  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  2.  Antithrombotics: -DVT/anticoagulation: Continue Pradaxa-discussed cost with wife and they will be able to afford.              -antiplatelet therapy: N/A 3. Pain Management: Continue Hydrocodone as needed   -continue massage right stump  -have scheduled tylenol 500mg  tid  -kpad for back pain, add muscle rub also  -want to avoid excessive use of hydrocodone d/t neurosedation 4. Mood: Provide emotional support             -antipsychotic agents: N/A 5. Neuropsych: This patient is not capable of making decisions on his own behalf. 6. Skin/Wound Care: Routine skin checks 7. Fluids/Electrolytes/Nutrition: see #8 8.  Dysphagia 1.  Dysphagia #1 nectar thick liquid diet follow-up speech therapy  -encourage PO  -intake borderline at present, -430cc for admit  -consider IVF  supplementation if he doesn't pick up  -check labs including prealbumin given lethargy  -have asked SLP to re-eval pt, may need to make NPO 9.  Atrial fibrillation.  Diltiazem 240 mg daily.  Cardiac rate controlled 10.  CAD with stenting.  Continue Pradaxa.  No chest pain or shortness of breath 11.  CKD stage III.  Baseline creatinine 1.39-1.60.  Follow-up chemistries 12.  Seizure disorder.  Depakote 500 mg twice daily 13.  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  Continue shrinker.  -wound looks fine 14.  Hypothyroidism.  Synthroid 15.  Hypertension.  Cardura 4 mg nightly.  Continue Lopressor 25 mg twice daily  3/19 controlled 16  GERD.  Protonix 17.  Hyperlipidemia.  Lipitor 18.  Diastolic congestive heart failure.  Lasix 20 mg daily.  Monitor for any signs of fluid overload   Filed Weights   05/26/20 1500  Weight: 65.2 kg    3/20 -need daily weights--still not done---order again  -CXR ordered  -hold lasix d/t sporadic intake 19.  Rheumatoid arthritis.  Continue chronic prednisone 20. L AC fossa IV d/c'd- active bleeding x10+ minutes- pressure applied to area yesterday. No active bleeding today 21. Anemia: Hgb 10.2-->9.7: check labs monday   LOS: 3 days A FACE TO FACE EVALUATION WAS PERFORMED  Meredith Staggers 05/29/2020, 8:43 AM

## 2020-05-29 NOTE — Progress Notes (Signed)
Pharmacy Antibiotic Note  [name] is a 87 y.o. male admitted on 05/26/2020 now with concern for aspiration pneumonia.  Pharmacy has been consulted for Zosyn dosing.  CXR shows potential LLL infiltrate. Patient very lethargic today. WBC up to 12.7. Afebrile. Scr is up today to 1.45 (CrCl 35 mL/min).  Plan: Zosyn 3.375g IV q8h (4 hour infusion).  Monitor renal function, clinical progress   Height: 5\' 9"  (175.3 cm) Weight: 65.2 kg (143 lb 11.8 oz) IBW/kg (Calculated) : 70.7  Temp (24hrs), Avg:98.3 F (36.8 C), Min:97.9 F (36.6 C), Max:98.6 F (37 C)  Recent Labs  Lab 05/23/20 0847 05/23/20 0852 05/26/20 0432 05/29/20 1152  WBC 7.1  --  6.8 12.7*  CREATININE 1.39* 1.40* 1.15 1.45*    Estimated Creatinine Clearance: 35 mL/min (A) (by C-G formula based on SCr of 1.45 mg/dL (H)).    Allergies  Allergen Reactions  . Eliquis [Apixaban] Other (See Comments)    bleeding  . Demerol [Meperidine] Nausea And Vomiting  . Keppra [Levetiracetam] Other (See Comments)    Causes sleepiness, mental status changes  . Vimpat [Lacosamide] Other (See Comments)    Over sedated     Antimicrobials this admission: Zosyn 3/20 >>   Microbiology results: 3/14 MRSA PCR neg  Thank you for allowing pharmacy to be a part of this patient's care.  Rebbeca Paul, PharmD PGY1 Pharmacy Resident 05/29/2020 2:38 PM  Please check [email or web address] for unit-specific pharmacy phone numbers.

## 2020-05-29 NOTE — Progress Notes (Signed)
Speech Language Pathology Daily Session Note  Patient Details  Name: [name] MRN: [id number] Date of Birth: 1933-03-26  Today's Date: 05/29/2020 SLP Individual Time: 1423-1500 SLP Individual Time Calculation (min): 37 min  Short Term Goals: Week 1: SLP Short Term Goal 1 (Week 1): Patient will participate in BSE to determine need for MBS. SLP Short Term Goal 2 (Week 1): Patient will achieve approximately 80% intelligibility at 3-4 word phrase level with modA SLP Short Term Goal 3 (Week 1): Patient will name objects and verb pictures/photos with minA. SLP Short Term Goal 4 (Week 1): Patient will consume current diet Dys 1, nectar liquids with minimal s/s aspiration, penetration SLP Short Term Goal 5 (Week 1): Patient will recall and describe daily events at basic/general level with modA.  Skilled Therapeutic Interventions: Pt was seen for skilled ST targeting dysphagia. MD and RN noted significant decline in pt's ability to safely consume breakfast and medicines today. Per MD, pt's CXR also is indicative of possible development of left lower lobe pneumonia and pt has elevated WBC count. Per pt's wife at bedside, pt also has a history of aspiration pneumonia. When provided with conservative PO trials of nectar and honey liquids, pt exhibited delayed congested cough response, as well as wet vocal quality. His cough was very weak, and he demonstrated difficulty triggering swallow as well. He is extremely fatigued, requiring constant cueing to maintain appropriate level of alertness for PO intake. Purees and nectar liquids also resulted in lingual residue, as well as residue collecting behind front lower dentition. Given pt's decline in medical status and presentation with POs today, would recommend pt downgrade to NPO with consideration of temporary alternative means of nutrition and medication administration. Pt WILL REQUIRE MBSS prior to diet initiation again, as instrumentation is the best means  of determining least restrictive diet at this time. ST will continue to follow closely to determine readiness for participation in [place]. Discussed results of today's diagnostic treatment session with pt's wife, RN, and MD. Pt left laying semi-reclined in bed with alarm set and needs within reach. Continue per current plan of care.          Pain Pain Assessment Pain Scale: Faces Faces Pain Scale: Hurts a little bit Pain Location: Generalized  Therapy/Group: Individual Therapy  Arbutus Leas 05/29/2020, 12:36 PM

## 2020-05-29 NOTE — Progress Notes (Signed)
Pt very lethargic this am, struggling with swallowing medication, and breakfast. Unable to give two medications due to swallowing difficulty, and pt being very lethargic. MD made aware.   [name]

## 2020-05-29 NOTE — Progress Notes (Addendum)
Addendum:  WBC's 12.3, CXR demonstrates a potential LLL infiltrate. BUN/Cr elevated, VPA level 90. Prealbumin 15.  CT of head pending, UA pending. These findings likely causing the clinical picture we're seeing.   Plan: 1) lasix already held 2) Hold VPA for now and recheck level in AM 3/21 (although reading might be skewed given timing of lab and AM administration of med 3) IV Zosyn 4) 1/2NS with 42meq KCL 5) Repeat CBC,BMET tomorrow 6) NPO per SLP recommendations until more alert   Meredith Staggers, MD, [facility] 05/29/2020

## 2020-05-29 NOTE — Progress Notes (Signed)
Pt very lethargic, responds to name and commands when spoken too. Pt wife notified staff of "gurgling sound" coming from the pt. Pt repositioned and sat up in the bed, this nurse was able to hear gurgling from the pt. Pt denies SOB at this time. Provider will be ,ade aware.     Verbal order from Dr. Naaman Plummer, MD  for pt to remain NPO, holding all the night medications ordered. Lidocaine patch provided to help with pain.

## 2020-05-29 NOTE — Progress Notes (Signed)
Pt returned to room from CT. Wife at  Bedside.

## 2020-05-30 LAB — BASIC METABOLIC PANEL
Anion gap: 10 (ref 5–15)
Anion gap: 12 (ref 5–15)
BUN: 26 mg/dL — ABNORMAL HIGH (ref 8–23)
BUN: 25 mg/dL — ABNORMAL HIGH (ref 8–23)
CO2: 22 mmol/L (ref 22–32)
CO2: 22 mmol/L (ref 22–32)
Calcium: 7.8 mg/dL — ABNORMAL LOW (ref 8.9–10.3)
Calcium: 7.8 mg/dL — ABNORMAL LOW (ref 8.9–10.3)
Chloride: 112 mmol/L — ABNORMAL HIGH (ref 98–111)
Chloride: 110 mmol/L (ref 98–111)
Creatinine, Ser: 1.32 mg/dL — ABNORMAL HIGH (ref 0.61–1.24)
Creatinine, Ser: 1.41 mg/dL — ABNORMAL HIGH (ref 0.61–1.24)
GFR, Estimated: 53 mL/min — ABNORMAL LOW (ref >60)
GFR, Estimated: 49 mL/min — ABNORMAL LOW (ref >60)
Glucose, Bld: 82 mg/dL (ref 70–99)
Glucose, Bld: 75 mg/dL (ref 70–99)
Potassium: 3.3 mmol/L — ABNORMAL LOW (ref 3.5–5.1)
Potassium: 3.4 mmol/L — ABNORMAL LOW (ref 3.5–5.1)
Sodium: 144 mmol/L (ref 135–145)
Sodium: 144 mmol/L (ref 135–145)

## 2020-05-30 LAB — CBC
HCT: 32.3 % — ABNORMAL LOW (ref 39.0–52.0)
Hemoglobin: 10.3 g/dL — ABNORMAL LOW (ref 13.0–17.0)
MCH: 32.7 pg (ref 26.0–34.0)
MCHC: 31.9 g/dL (ref 30.0–36.0)
MCV: 102.5 fL — ABNORMAL HIGH (ref 80.0–100.0)
Platelets: 122 10*3/uL — ABNORMAL LOW (ref 150–400)
RBC: 3.15 MIL/uL — ABNORMAL LOW (ref 4.22–5.81)
RDW: 16.4 % — ABNORMAL HIGH (ref 11.5–15.5)
WBC: 8.1 10*3/uL (ref 4.0–10.5)
nRBC: 0 % (ref 0.0–0.2)

## 2020-05-30 LAB — CBC WITH DIFFERENTIAL/PLATELET
Abs Immature Granulocytes: 0.08 10*3/uL — ABNORMAL HIGH (ref 0.00–0.07)
Basophils Absolute: 0 10*3/uL (ref 0.0–0.1)
Basophils Relative: 0 %
Eosinophils Absolute: 0.1 10*3/uL (ref 0.0–0.5)
Eosinophils Relative: 1 %
HCT: 29.3 % — ABNORMAL LOW (ref 39.0–52.0)
Hemoglobin: 9.4 g/dL — ABNORMAL LOW (ref 13.0–17.0)
Immature Granulocytes: 1 %
Lymphocytes Relative: 6 %
Lymphs Abs: 0.6 10*3/uL — ABNORMAL LOW (ref 0.7–4.0)
MCH: 32.2 pg (ref 26.0–34.0)
MCHC: 32.1 g/dL (ref 30.0–36.0)
MCV: 100.3 fL — ABNORMAL HIGH (ref 80.0–100.0)
Monocytes Absolute: 0.9 10*3/uL (ref 0.1–1.0)
Monocytes Relative: 10 %
Neutro Abs: 7.6 10*3/uL (ref 1.7–7.7)
Neutrophils Relative %: 82 %
Platelets: 150 10*3/uL (ref 150–400)
RBC: 2.92 MIL/uL — ABNORMAL LOW (ref 4.22–5.81)
RDW: 16.4 % — ABNORMAL HIGH (ref 11.5–15.5)
WBC: 9.3 10*3/uL (ref 4.0–10.5)
nRBC: 0 % (ref 0.0–0.2)

## 2020-05-30 LAB — URINALYSIS, COMPLETE (UACMP) WITH MICROSCOPIC
Bilirubin Urine: NEGATIVE
Glucose, UA: NEGATIVE mg/dL
Hgb urine dipstick: NEGATIVE
Ketones, ur: 15 mg/dL — AB
Leukocytes,Ua: NEGATIVE
Nitrite: NEGATIVE
Protein, ur: NEGATIVE mg/dL
RBC / HPF: NONE SEEN RBC/hpf (ref 0–5)
Specific Gravity, Urine: 1.025 (ref 1.005–1.030)
pH: 6 (ref 5.0–8.0)

## 2020-05-30 LAB — VALPROIC ACID LEVEL: Valproic Acid Lvl: 46 ug/mL — ABNORMAL LOW (ref 50.0–100.0)

## 2020-05-30 MED ORDER — GERHARDT'S BUTT CREAM
TOPICAL_CREAM | Freq: Two times a day (BID) | CUTANEOUS | Status: DC
  Administered 2020-06-01: 1 via TOPICAL
  Filled 2020-05-30: qty 1

## 2020-05-30 MED ORDER — ACETAMINOPHEN 10 MG/ML IV SOLN
1000.0000 mg | Freq: Four times a day (QID) | INTRAVENOUS | Status: AC
  Administered 2020-05-31: 1000 mg via INTRAVENOUS
  Filled 2020-05-30 (×4): qty 100

## 2020-05-30 MED ORDER — VALPROATE SODIUM 100 MG/ML IV SOLN
500.0000 mg | Freq: Two times a day (BID) | INTRAVENOUS | Status: DC
  Administered 2020-05-30 – 2020-06-04 (×11): 500 mg via INTRAVENOUS
  Filled 2020-05-30 (×14): qty 5

## 2020-05-30 MED ORDER — BLISTEX MEDICATED EX OINT
TOPICAL_OINTMENT | CUTANEOUS | Status: DC | PRN
  Filled 2020-05-30: qty 6.3

## 2020-05-30 NOTE — Progress Notes (Signed)
Occupational Therapy Session Note  Patient Details  Name: [name] MRN: [id number] Date of Birth: 26-Oct-1933  Today's Date: 05/30/2020 OT Individual Time: 6578-4696 OT Individual Time Calculation (min): 9 min  and Today's Date: 05/30/2020 OT Missed Time: 66 Minutes Missed Time Reason: Patient fatigue (wife refused until seen by MD)   Short Term Goals: Week 1:  OT Short Term Goal 1 (Week 1): Pt will be able to engage in UB bathing seated EOB with min assist for sitting balance OT Short Term Goal 2 (Week 1): Pt will engage in UB dressing with mod assist OT Short Term Goal 3 (Week 1): Pt will roll R and L in bed with mod assist to allow caregiver to complete LB dressing OT Short Term Goal 4 (Week 1): Pt will complete slide board transfer with max assist of 1 caregiver  Skilled Therapeutic Interventions/Progress Updates:    Limited treatment session secondary to wife wanting to wait until MD rounds on pt.  Per chart review and reports from LPN, pt NPO with changes in medical status.  Upon arrival, wife very anxious and expressing desire to wait until MD rounds before any therapy.  Pt asking for water, educated on NPO but offered wash cloth to face and lips. Therapist attempted to engage pt in washing face for functional use of BUE, pt demonstrating decreased coordination and then attempting to squeeze water in mouth.  Therapist reiterated NPO and lightly brushed cloth over lips to provide moisture.  Wife requesting therapist return after MD rounds.    Therapy Documentation Precautions:  Precautions Precautions: Fall Precaution Comments: very thin skin, skin tears easily with multiple sites of skin breakdown; R neglect Restrictions Weight Bearing Restrictions: No General:   Vital Signs:   Pain: Pain Assessment Pain Scale: Faces Faces Pain Scale: Hurts a little bit Patients Stated Pain Goal: 0 Pain Intervention(s): Repositioned ADL: ADL Eating: Dependent Where Assessed-Eating:  Bed level Grooming: Maximal assistance Where Assessed-Grooming: Bed level Upper Body Bathing: Dependent Where Assessed-Upper Body Bathing: Bed level Lower Body Bathing: Dependent Where Assessed-Lower Body Bathing: Bed level Upper Body Dressing: Not assessed Lower Body Dressing: Not assessed Toileting: Dependent Vision   Perception    Praxis   Exercises:   Other Treatments:     Therapy/Group: Individual Therapy  Simonne Come 05/30/2020, 8:58 AM

## 2020-05-30 NOTE — Progress Notes (Addendum)
PROGRESS NOTE   Subjective/Complaints: Discussed CT, CXR, and lab results with patient and wife this morning and answered all questions.  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   ROS: limited due to language/communication   Objective:   DG Chest 2 View  Result Date: 05/29/2020 CLINICAL DATA:  Cough, chest pain, shortness of breath EXAM: CHEST - 2 VIEW COMPARISON:  05/23/2020 FINDINGS: The chin obscures the left lung apex. Low lung volumes are present, causing crowding of the pulmonary vasculature. Borderline elevated left hemidiaphragm. Left lower lobe airspace opacity most striking on the lateral projection, compatible with pneumonia or atelectasis. Borderline cardiomegaly. No edema. The right lung appears clear. Thoracic spondylosis. IMPRESSION: 1. Left lower lobe airspace opacity compatible with pneumonia or atelectasis. 2. Low lung volumes. Electronically Signed   By: Van Clines M.D.   On: 05/29/2020 13:59   CT HEAD WO CONTRAST  Result Date: 05/29/2020 CLINICAL DATA:  Lethargy, dysarthria and dysphagia. Left posterior frontal and parietal cortical and subcortical infarction. EXAM: CT HEAD WITHOUT CONTRAST TECHNIQUE: Contiguous axial images were obtained from the base of the skull through the vertex without intravenous contrast. COMPARISON:  MRI 05/25/2020 FINDINGS: Brain: No focal abnormality is seen affecting the pons or cerebellum. Cerebral hemispheres show generalized atrophy with chronic small-vessel ischemic changes of the cerebral hemispheric white matter. Acute cortical infarction in the left posterior frontal and parietal region can be appreciated on CT as cortical low-density. No swelling/mass effect or hemorrhage. No new insult identified. No hydrocephalus or extra-axial collection.  Vascular: There is atherosclerotic calcification of the major vessels at the base of the brain. Skull: Negative Sinuses/Orbits: Clear/normal Other: None IMPRESSION: 1. Acute cortical infarction in the left posterior frontal and parietal region can be appreciated on CT as cortical low-density. No swelling/mass effect or hemorrhage. No new insult identified. 2. Atrophy and chronic small-vessel ischemic changes of the cerebral hemispheric white matter. Electronically Signed   By: Nelson Chimes M.D.   On: 05/29/2020 20:57   Recent Labs    05/29/20 1152 05/30/20 0543  WBC 12.7* 8.1  HGB 9.8* 10.3*  HCT 30.6* 32.3*  PLT 131* 122*   Recent Labs    05/29/20 1152 05/30/20 0543  NA 141 144  K 3.2* 3.3*  CL 109 112*  CO2 21* 22  GLUCOSE 99 82  BUN 27* 26*  CREATININE 1.45* 1.32*  CALCIUM 7.7* 7.8*    Intake/Output Summary (Last 24 hours) at 05/30/2020 0942 Last data filed at 05/29/2020 1300 Gross per 24 hour  Intake 150 ml  Output --  Net 150 ml        Physical Exam: Vital Signs Blood pressure 126/77, pulse 98, temperature 97.9 F (36.6 C), temperature source Oral, resp. rate 16, height 5\' 9"  (1.753 m), weight 61.7 kg, SpO2 100 %. Gen: no distress, normal appearing HEENT: oral mucosa pink and moist, NCAT Cardio: Reg rate Chest: normal effort, normal rate of breathing Abd: soft, non-distended Ext: no edema Psych: pleasant, normal affect Skin: arm bruising. Right bk site inspected and only tiny  residual open areas. No visible drainage.  Neuro: Alert and oriented x3. Musculoskeletal: LB tender with palpation. ROM. Did not seem to have much tenderness in right BK limb. Limb well shaped, shrinker sock    Cervical back: Normal range of motion and neck supple.     Comments: RUE- 3/5 in proximal muscles- 3+/5 distally--inconcistent LUE- 4+/5 inconsistent effort RLE- 3/5 in HF/KE/KF- has R BKA LLE- 4 to 4+/5 in LLE- in HF/KE/DF and PF  Neurological:     Comments: Patient is lethargic  this morning.  Makes eye contact with examiner.  expressive language deficits. dsyarthria.  follows simple commands. Sensation Intact to light touch in UEs and LLE/R BKA       Assessment/Plan: 1. Functional deficits which require 3+ hours per day of interdisciplinary therapy in a comprehensive inpatient rehab setting.  Physiatrist is providing close team supervision and 24 hour management of active medical problems listed below.  Physiatrist and rehab team continue to assess barriers to discharge/monitor patient progress toward functional and medical goals  Care Tool:  Bathing        Body parts bathed by helper: Right arm,Left arm,Abdomen,Chest     Bathing assist Assist Level: Total Assistance - Patient < 25% (pt refusing to engage in LB bathing/dressing today)     Upper Body Dressing/Undressing Upper body dressing   What is the patient wearing?: Hospital gown only    Upper body assist Assist Level: Dependent - Patient 0%    Lower Body Dressing/Undressing Lower body dressing      What is the patient wearing?: Incontinence brief     Lower body assist Assist for lower body dressing: Dependent - Patient 0%     Toileting Toileting    Toileting assist Assist for toileting: Dependent - Patient 0%     Transfers Chair/bed transfer  Transfers assist     Chair/bed transfer assist level: 2 Helpers     Locomotion Ambulation   Ambulation assist   Ambulation activity did not occur: Safety/medical concerns          Walk 10 feet activity   Assist  Walk 10 feet activity did not occur: Safety/medical concerns (fatigue, generalized weakness, decreased balance)        Walk 50 feet activity   Assist Walk 50 feet with 2 turns activity did not occur: Safety/medical concerns (fatigue, generalized weakness, decreased balance)         Walk 150 feet activity   Assist Walk 150 feet activity did not occur: Safety/medical concerns (fatigue, generalized  weakness, decreased balance)         Walk 10 feet on uneven surface  activity   Assist Walk 10 feet on uneven surfaces activity did not occur: Safety/medical concerns (fatigue, generalized weakness, decreased balance)         Wheelchair     Assist Will patient use wheelchair at discharge?: Yes Type of Wheelchair: Manual    Wheelchair assist level: Supervision/Verbal cueing Max wheelchair distance: 3ft    Wheelchair 50 feet with 2 turns activity    Assist        Assist Level: Total Assistance - Patient < 25%   Wheelchair 150 feet activity     Assist      Assist Level: Dependent - Patient 0%   Blood pressure 126/77, pulse 98, temperature 97.9 F (36.6 C), temperature source Oral, resp. rate 16, height 5\' 9"  (1.753 m), weight 61.7 kg, SpO2 100 %.    Medical Problem List and Plan: 1.  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  2.  Antithrombotics: -DVT/anticoagulation: Continue Pradaxa-discussed cost with wife and they will be able to afford.              -antiplatelet therapy: N/A 3. Severe chronic low back pain  IV Tylenol ordered  -continue massage right stump  -have scheduled tylenol 500mg  tid  -kpad for back pain, add muscle rub also  -want to avoid excessive use of hydrocodone d/t neurosedation 4. Mood: Provide emotional support             -antipsychotic agents: N/A 5. Neuropsych: This patient is not capable of making decisions on his own behalf. 6. Skin/Wound Care: Routine skin checks. Air mattress ordered, Gerhardt's butt cream ordered for MASD.  7. Fluids/Electrolytes/Nutrition: see #8 8.  Dysphagia 1.  Dysphagia #1 nectar thick liquid diet follow-up speech  therapy  -encourage PO  -intake borderline at present, -430cc for admit  -consider IVF supplementation if he doesn't pick up  -check labs including prealbumin given lethargy  -have asked SLP to re-eval pt, may need to make NPO 9.  Atrial fibrillation.  Diltiazem 240 mg daily.  Cardiac rate controlled 10.  CAD with stenting.  Continue Pradaxa.  No chest pain or shortness of breath 11.  CKD stage III.  Baseline creatinine 1.39-1.60.  Follow-up chemistries 12.  Seizure disorder.  Depakote 500 mg twice daily 13.  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  Continue shrinker.  -wound looks fine 14.  Hypothyroidism.  Synthroid 15.  Hypertension.  Cardura 4 mg nightly.  Continue Lopressor 25 mg twice daily  3/19 controlled 16  GERD.  Protonix 17.  Hyperlipidemia.  Lipitor 18.  Diastolic congestive heart failure.  Lasix 20 mg daily.  Monitor for any signs of fluid overload   Filed Weights   05/26/20 1500 05/30/20 0500  Weight: 65.2 kg 61.7 kg    3/20 -need daily weights--still not done---order again  -CXR ordered: results discussed with patient and wife: shows pneumonia  -hold lasix d/t sporadic intake 19.  Rheumatoid arthritis.  Continue chronic prednisone 20. L AC fossa IV d/c'd- active bleeding x10+ minutes- pressure applied to area yesterday. No active bleeding today 21. Anemia: Hgb 10.2-->9.7: check labs Monday 22. Pneumonia: Continue IV Zosyn. WBC normal today. Repeat tomorrow.  23. Disposition: wife is interested in home hospice. Palliative care consulted.   >35 minutes spent in examining patient, discussing results of lab work, CT head, consulting pharmacy regarding increasing valproic acid, discussing home hospice with wife and ordering palliative care consult, discussing plan with nursing, therapy, and PA, discussing conitnuing IV zosyn for pneumonia treatment, ordered IV Tylenol for pain control  LOS: 4 days A FACE TO FACE EVALUATION WAS PERFORMED  Clide Deutscher  Emersynn Deatley 05/30/2020, 9:42 AM

## 2020-05-30 NOTE — Progress Notes (Signed)
Occupational Therapy Note  Patient Details  Name: MEDARD DECUIR MRN: [id number] Date of Birth: [birth year]  Pt's plan of care adjusted to 15/7 after speaking with care team and discussed with MD as pt currently unable to tolerate current therapy schedule with OT, PT, and SLP.     Simonne Come 05/30/2020, 9:57 AM

## 2020-05-30 NOTE — Progress Notes (Signed)
Physical Therapy Session Note  Patient Details  Name: BATU CASSIN MRN: [id number] Date of Birth: 08/10/1933  Today's Date: 05/30/2020 PT Individual Time: 5009-3818 PT Individual Time Calculation (min): 42 min   Short Term Goals: Week 1:  PT Short Term Goal 1 (Week 1): pt will roll L and R with min A PT Short Term Goal 2 (Week 1): pt will transfer bed<>WC with LRAD and mod A of 1 PT Short Term Goal 3 (Week 1): pt will perform WC mobility 58ft with supervision  Skilled Therapeutic Interventions/Progress Updates:    RN at bedside at start of session assisting with pericare. Wife also at bedside. +2 assist for dependant pericare due to large BM. Able to roll several times in bed requiring minA and cues for reaching across body to bedrail in order to complete. Pt had Sizewise air mattress delivered to room. Performed dependant supine lateral scoot across from regular hospital bed to air mattress bed, +2 assist. He required +2 assist for scooting up to [facility] as well.   Wife with plenty of questions regarding follow up care, pt's current mobility status, DME rec's, and her most concerning questions regarding how she is going to transfer him out of the bed. Discussed how she previously transferred him with use of sliding board and using a counting checklist - 1,2,3,4 "1 brake lock, 2 brake lock, 3 arm rest removal, 4 sliding board placement." Discussed patient's current barriers to completing sliding board safely and provided options such as a hoyer lift for both her and his safety. Pt's wife receptive to all education. Patient missed 18 minutes of skilled therapy due to fatigue, RN made aware.  Therapy Documentation Precautions:  Precautions Precautions: Fall Precaution Comments: very thin skin, skin tears easily with multiple sites of skin breakdown; R neglect Restrictions Weight Bearing Restrictions: No General: PT Amount of Missed Time (min): 18 Minutes PT Missed Treatment Reason: Patient  fatigue  Therapy/Group: Individual Therapy  Alger Simons 05/30/2020, 7:39 AM

## 2020-05-30 NOTE — Progress Notes (Signed)
Occupational Therapy Note  Patient Details  Name: [name] MRN: [id number] Date of Birth: Jan 23, 1934   Attempted to see pt for make up time from earlier missed mins.  Pt having just been transferred to new air mattress bed for improved positioning and pressure relief and pt now asleep.  Pt's wife still to speak with palliative about goals of care, will continue per POC until new goals established after palliative consult.   Simonne Come 05/30/2020, 2:58 PM

## 2020-05-30 NOTE — Progress Notes (Signed)
Physical Therapy Session Note  Patient Details  Name: [name] MRN: [id number] Date of Birth: 1934-01-21  Today's Date: 05/30/2020 PT Individual Time: 1100-1130 PT Individual Time Calculation (min): 30 min  and Today's Date: 05/30/2020 PT Missed Time: 30 Minutes Missed Time Reason: Patient fatigue  Short Term Goals: Week 1:  PT Short Term Goal 1 (Week 1): pt will roll L and R with min A PT Short Term Goal 2 (Week 1): pt will transfer bed<>WC with LRAD and mod A of 1 PT Short Term Goal 3 (Week 1): pt will perform WC mobility 47ft with supervision  Skilled Therapeutic Interventions/Progress Updates:    Patient received reclined in bed, asleep, wife at bedside. Wife stating she didn't think he was "awake enough" to participate in therapy. Patient would open his eyes to attempts to rouse him, but was unable to keep his eyes open. Patient, with his eyes closed, was able to express that he was tired and didn't want to get out of bed. Very wet quality to his voice. PT and wife encouraging patient to cough and/or swallow. Very unproductive cough produced. Patient able to swallow a small amount of secretions with no coughing after the fact and the quality of his voice improved temporarily. Wife requesting guidance on equipment for home use, stating that she wanted to have suction available for him to use once home. PT passed along request to MD. Per wife and MD, patient possibly transitioning to home with hospice. PT deferring specific questions regarding hospice and level of care they provide to hospice coordinator/SW/MD. PT emphasizing self-care to wife to allow her to support husband as needed. Wife appreciative of counseling provided. Patient unable to further participate in therapy as he was unable to maintain safe levels of arousal. Wife at bedside.   Therapy Documentation Precautions:  Precautions Precautions: Fall Precaution Comments: very thin skin, skin tears easily with multiple sites of  skin breakdown; R neglect Restrictions Weight Bearing Restrictions: No    Therapy/Group: Individual Therapy  Karoline Caldwell, PT, DPT, CBIS  05/30/2020, 7:47 AM

## 2020-05-30 NOTE — Progress Notes (Signed)
Patient ID: [name], male   DOB: 04/03/1933, 87 y.o.   MRN: [id number]  Met with pt and wife who is at the bedside to discuss plan. Palliative consult made and have 24-48 hr to address. Made wife aware they will contact her to set up a time to meet and then discuss if pt appropriate for hospice services. Wife has a MD appointment tomorrow and encouraged her to go to it and not cancel. She needs to take care of herself and she is down to 88 lbs according to her. Will await palliative care meeting and work on best plan for pt and wife.

## 2020-05-30 NOTE — Progress Notes (Signed)
Speech Language Pathology Daily Session Note  Patient Details  Name: [name] MRN: [id number] Date of Birth: October 15, 1933  Today's Date: 05/30/2020 SLP Individual Time: 1300-1343 SLP Individual Time Calculation (min): 43 min  Short Term Goals: Week 1: SLP Short Term Goal 1 (Week 1): Patient will participate in BSE to determine need for MBS. SLP Short Term Goal 2 (Week 1): Patient will achieve approximately 80% intelligibility at 3-4 word phrase level with modA SLP Short Term Goal 3 (Week 1): Patient will name objects and verb pictures/photos with minA. SLP Short Term Goal 4 (Week 1): Patient will consume current diet Dys 1, nectar liquids with minimal s/s aspiration, penetration SLP Short Term Goal 5 (Week 1): Patient will recall and describe daily events at basic/general level with modA.  Skilled Therapeutic Interventions: Pt was seen for skilled ST targeting dysphagia goals and education with pt's wife. Pt was lethargic but able to arouse for brief periods of time throughout session and participate appropriately (~10 minute intervals). SLP provided Max A for oral care via suction toothbrush, and taught pt's wife how to perform oral care with use of suction toothbrush - she verbally relayed all instructions to demonstrate learning. SLP also reiterated importance of oral care with pt's aspiration risk and hx of recurring aspiration pneumonias, per wife's report. When pt was fully alert, pt consumed 3 ice chips with wet vocal quality in 100% of trials, cough in 1/3 trials. Pt required Max A verbal and demonstrational cueing to use his throat clear and cough (weak) to help mobilize secretions and expectorate orally via oral suction. Would recommend pt continue NPO with ice chips ONLY with SLP present at this time. Pt is still not appropriate to participate in MBSS, but making some progress. Pt will require MBSS to assess oropharyngeal swallow function before returning home. His wife was in agreement  with plan. Pt left laying in bed with alarm set and needs within reach. Continue per current plan of care.          Pain Pain Assessment Pain Scale: Faces Faces Pain Scale: Hurts little more Pain Type: Chronic pain Pain Location: Generalized Pain Descriptors / Indicators: Aching;Discomfort Pain Onset: On-going Pain Intervention(s): Emotional support;Repositioned;RN made aware  Therapy/Group: Individual Therapy  Arbutus Leas 05/30/2020, 7:17 AM

## 2020-05-31 ENCOUNTER — Other Ambulatory Visit: Payer: Self-pay | Admitting: *Deleted

## 2020-05-31 DIAGNOSIS — Z7189 Other specified counseling: Secondary | ICD-10-CM

## 2020-05-31 MED ORDER — ACETAMINOPHEN 10 MG/ML IV SOLN
1000.0000 mg | Freq: Four times a day (QID) | INTRAVENOUS | Status: AC
  Administered 2020-05-31 – 2020-06-01 (×2): 1000 mg via INTRAVENOUS
  Filled 2020-05-31 (×4): qty 100

## 2020-05-31 MED ORDER — TRAZODONE HCL 50 MG PO TABS
50.0000 mg | ORAL_TABLET | Freq: Every day | ORAL | Status: DC
  Administered 2020-05-31 – 2020-06-01 (×2): 50 mg via ORAL
  Filled 2020-05-31 (×2): qty 1

## 2020-05-31 NOTE — Consult Note (Signed)
Consultation Note Date: 05/31/2020   Patient Name: [name]  DOB: 1933/05/08  MRN: [id number]  Age / Sex: 87 y.o., male  PCP: [name], MD Referring Physician: Izora Ribas, MD  Reason for Consultation: Hospice Evaluation  HPI/Patient Profile: 87 y.o. male  with past medical history of a fib, CADs/p stenting and amputation, diastolic CHF, CKD III, HTN, rheumatoid arthritis, hyperlipidemia, seizure disorder, cognitive deficits initially admitted on 05/23/2020 with R side weakness and L gaze preference. Received TPA- MRI indicated acute ischemia. Transferred to inpatient rehab on 05/28/2020. Over the the following weekend had extension of his stroke, resulting in worsening mental status and dysphagia. Developed aspiration pnuemonia. Palliative medicine consulted for [place] and "hospice discussion".     Clinical Assessment and Goals of Care:  I have reviewed medical records including EPIC notes, labs and imaging, received report from his nurse and NP, examined the patient and met at bedside with the patient's spouse and son to discuss diagnosis prognosis, GOC, EOL wishes, disposition and options.  I introduced Palliative Medicine as specialized medical care for people living with serious illness. It focuses on providing relief from the symptoms and stress of a serious illness.   We discussed a brief life review of the patient.  He is retired Software engineer.  The definition of palliative medicine and hospice was discussed in depth with the family.  The patient's spouse was under the impression that palliative medicine would serve as a care provider in the home providing 24/7 care along with IV fluids and treatments.  We discussed that palliative medicine in the community typically provides a provider visit every few weeks for evaluation of symptoms and continued goals of care discussions, however no physical  support is provided.  We also discussed hospice and the services that are provided from hospice which typically include an aide for a few hours a day but not 24 hours.  A nurse to check symptoms intermittently, and an on-call nurse for help with symptom management.  The philosophy of hospice was also discussed which includes supporting someone through end-of-life with the goals of maintaining quality and symptoms but not prolonging time.  Patient spouse was very surprised by these definitions and was quick to state that she does not believe patient is at end-of-life and she is not interested in pursuing comfort measures at this time.  As far as functional and nutritional status-patient has had significant decline over the last few years and more acutely over the last week since his CVA.  He is not taking in adequate nutrition to support life, there are also concerns for him aspirating.  Last PT notes he was very lethargic but was agreeable to participating in bed level exercises, he was not able to get out of bed.  We discussed his current illness and what it means in the larger context of his on-going co-morbidities.  Natural disease trajectory and expectations at EOL were discussed.  Patient's son shared his concerns that patient will get into a cycle of continuous hospitalizations with  ongoing decline in suffering.  I attempted to elicit values and goals of care important to the patient.  Patient's spouse shares her feelings that when patient had his stroke last week he looked at her, and said "I was afraid I was going to die".  She felt at that time he was expressing his feelings of not being ready to die, and wanting to continue to pursuing life-prolonging measures.  The difference between aggressive medical intervention and comfort care was considered in light of the patient's goals of care.   Advanced directives, concepts specific to code status, artifical feeding and hydration, and  rehospitalization were considered and discussed.  Hospice and Palliative Care services outpatient were explained and offered.  Questions and concerns were addressed.  The family was encouraged to call with questions or concerns.   A copy of hard choices book was given to both family members for review.   Patient's spouse appeared extremely exhausted and frail.  I encouraged her to please take some time and get rest for herself, so that she can have a clear mind when making decisions.  Patient's son was also supportive of this.  Primary Decision Maker NEXT OF KIN- patient's spouse    SUMMARY OF RECOMMENDATIONS -Continue current scope of care -Patient's spouse is very concerned about patient being n.p.o., however she does not want comfort measures-she inquired about feeding tube -Information regarding the differences between hospice and palliative was given to patient's spouse and son along with a copy of the hard choices book -Plan made to meet again tomorrow morning at 1030   Code Status/Advance Care Planning:  DNR   Palliative Prophylaxis:   Aspiration  Prognosis:    Unable to determine  Discharge Planning: To Be Determined  Primary Diagnoses: Present on Admission: . Chronic diastolic (congestive) heart failure ([place]) . Anemia of chronic disease . Chronic atrial fibrillation . Stroke (cerebrum) ([place])   I have reviewed the medical record, interviewed the patient and family, and examined the patient. The following aspects are pertinent.  Past Medical History:  Diagnosis Date  . Abnormality of gait 09/22/2015  . Arthritis   . Atrial fibrillation ([place])   . CAD (coronary artery disease)    Stent to RCA, Penta stent, 99% reduced to 0% 2002.  . Cancer ([place])    skin CA removed from back  . 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 Chronic insomnia 03/30/2015  . Chronic kidney disease, stage 3 ([place])   . Complication of anesthesia    trouble waking  up  . GERD (gastroesophageal reflux disease)   . Hypercholesteremia   . Hypertension   . Hypothyroidism   . Iron deficiency anemia   . Memory difficulty 09/22/2015  . Osteoarthritis   . PAD (peripheral artery disease) ([place])   . Pneumonia   . Seizures ([place])   . Sepsis ([place]) 05/2017  . Transient alteration of awareness 03/30/2015  . Vertigo    hx of  . Vitamin D deficiency    Social History   Socioeconomic History  . Marital status: Married    Spouse name: [name]  . Number of children: 3  . Years of education: 3  . Highest education level: Professional school degree (e.g., MD, DDS, DVM, JD)  Occupational History  . Occupation: Retired Software engineer  Tobacco Use  . Smoking status: Former Smoker    Packs/day: 1.00    Years: 5.00    Pack years: 5.00  . Smokeless tobacco: Never Used  Vaping Use  . Vaping Use: Never used  Substance and Sexual Activity  . Alcohol use: Not Currently    Comment: rare  . Drug use: No  . Sexual activity: Not Currently  Other Topics Concern  . Not on file  Social History Narrative   Lives at home w/ his wife [name]   Patient drinks 4-5 cups of coffee daily.   Patient is right handed.    Social Determinants of Health   Financial Resource Strain: Low Risk   . Difficulty of Paying Living Expenses: Not hard at all  Food Insecurity: No Food Insecurity  . Worried About Charity fundraiser in the Last Year: Never true  . Ran Out of Food in the Last Year: Never true  Transportation Needs: No Transportation Needs  . Lack of Transportation (Medical): No  . Lack of Transportation (Non-Medical): No  Physical Activity: Inactive  . Days of Exercise per Week: 0 days  . Minutes of Exercise per Session: 0 min  Stress: No Stress Concern Present  . Feeling of Stress : Only a little  Social Connections: Moderately Integrated  . Frequency of Communication with Friends and Family: Twice a week  . Frequency of Social Gatherings with Friends and  Family: More than three times a week  . Attends Religious Services: 1 to 4 times per year  . Active Member of Clubs or Organizations: No  . Attends Archivist Meetings: Never  . Marital Status: Married   Scheduled Meds: . atorvastatin  80 mg Oral Daily  . dabigatran  150 mg Oral Q12H  . diltiazem  240 mg Oral Daily  . doxazosin  4 mg Oral QHS  . Gerhardt's butt cream   Topical BID  . levothyroxine  150 mcg Oral QAC breakfast  . lidocaine  3 patch Transdermal Q24H  . metoprolol tartrate  25 mg Oral BID  . pantoprazole  40 mg Oral QHS  . predniSONE  4 mg Oral Q breakfast  . traZODone  50 mg Oral QHS   Continuous Infusions: . 0.45 % NaCl with KCl 20 mEq / L 75 mL/hr at 05/31/20 0557  . acetaminophen    . piperacillin-tazobactam (ZOSYN)  IV 3.375 g (05/31/20 1428)  . valproate sodium Stopped (05/31/20 1331)   PRN Meds:.HYDROcodone-acetaminophen, lip balm, Muscle Rub, oxymetazoline, Resource ThickenUp Clear Medications Prior to Admission:  Prior to Admission medications   Medication Sig Start Date End Date Taking? Authorizing Provider  acetaminophen (TYLENOL) 160 MG/5ML solution Place 20.3 mLs (650 mg total) into feeding tube every 4 (four) hours as needed for mild pain (or temp > 37.5 C (99.5 F)). 05/26/20   Bailey-Modzik, Delila A, NP  acetaminophen (TYLENOL) 325 MG tablet Take 2 tablets (650 mg total) by mouth every 4 (four) hours as needed for mild pain (or temp > 37.5 C (99.5 F)). 05/26/20   Bailey-Modzik, Delila A, NP  acetaminophen (TYLENOL) 650 MG suppository Place 1 suppository (650 mg total) rectally every 4 (four) hours as needed for mild pain (or temp > 37.5 C (99.5 F)). 05/26/20   Bailey-Modzik, Delila A, NP  alendronate (FOSAMAX) 70 MG tablet Take 70 mg by mouth every Wednesday. Remain upright for 30-60 minutes. 01/06/12   [provider]  atorvastatin (LIPITOR) 80 MG tablet Take 1 tablet (80 mg total) by mouth daily. 05/26/20   Bailey-Modzik, Delila A, NP   Calcium Carb-Cholecalciferol (CALCIUM 600+D) 600-800 MG-UNIT TABS Take 1 tablet by mouth daily.    [provider]  dabigatran (PRADAXA) 150 MG CAPS capsule Take 1 capsule (150 mg total) by mouth every 12 (twelve) hours. 05/26/20   Bailey-Modzik, Delila A, NP  diltiazem (CARDIZEM CD) 240 MG 24 hr capsule TAKE 1 CAPSULE BY MOUTH EVERY MORNING. HOLD IF SYSTOLIC BLOOD PRESSURE IS LESS THAN 100 Patient taking differently: Take 240 mg by mouth daily. Hold if systolic blood pressure is less than 100. 12/10/19   Croitoru, Mihai, MD  divalproex (DEPAKOTE ER) 500 MG 24 hr tablet Take 1 tablet (500 mg total) by mouth 2 (two) times daily. 05/26/20   Bailey-Modzik, Delila A, NP  docusate sodium (COLACE) 100 MG capsule Take 100 mg by mouth daily as needed for mild constipation.    [provider]  doxazosin (CARDURA) 4 MG tablet TAKE 1 TABLET(4 MG) BY MOUTH AT BEDTIME Patient taking differently: Take 4 mg by mouth at bedtime. 12/17/19   Croitoru, Mihai, MD  furosemide (LASIX) 20 MG tablet Take 1 tablet (20 mg total) by mouth daily. 07/20/19 01/06/29  Croitoru, Mihai, MD  HYDROcodone-acetaminophen (NORCO) 10-325 MG tablet Take 1 tablet by mouth every 4 (four) hours as needed for pain. 06/23/19   [provider]  levothyroxine (SYNTHROID, LEVOTHROID) 150 MCG tablet Take 150 mcg by mouth daily before breakfast. 12/27/11   [provider]  lidocaine (LIDODERM) 5 % Place 1-5 patches onto the skin daily. Remove & Discard patch within 12 hours or as directed by MD (lower back and shoulders)    [provider]  Maltodextrin-Xanthan Gum ([place]) POWD Take 1 Container by mouth as needed. 05/26/20   Bailey-Modzik, Delila A, NP  metoprolol tartrate (LOPRESSOR) 25 MG tablet TAKE 1 TABLET BY MOUTH TWICE DAILY WITH A MEAL OR IMMEDIATELY AFTER Patient taking differently: Take 25 mg by mouth 2 (two) times daily. WITH A MEAL OR IMMEDIATELY AFTER 05/02/20   Croitoru, Mihai, MD   Multiple Vitamins-Minerals (CERTAVITE/ANTIOXIDANTS) TABS Take 1 tablet by mouth every evening.     [provider]  nitrofurantoin (MACRODANTIN) 100 MG capsule Take 100 mg by mouth daily. 09/30/19   [provider]  nitroGLYCERIN (NITROSTAT) 0.4 MG SL tablet Place 1 tablet (0.4 mg total) under the tongue every 5 (five) minutes as needed for chest pain (call 911 afer 3 doses and if chest pain persists). 06/19/18   Croitoru, Mihai, MD  pantoprazole (PROTONIX) 40 MG tablet Take 1 tablet (40 mg total) by mouth 2 (two) times daily. Patient not taking: Reported on 05/23/2020 01/11/20 02/10/20  Darliss Cheney, MD  polyethylene glycol (MIRALAX / GLYCOLAX) 17 g packet Take 17 g by mouth at bedtime.    [provider]  potassium chloride SA (KLOR-CON) 20 MEQ tablet Take 0.5 tablets (10 mEq total) by mouth daily. 10/22/19   Croitoru, Mihai, MD  predniSONE (DELTASONE) 1 MG tablet Take 4 mg by mouth daily with breakfast.    [provider]  predniSONE (DELTASONE) 5 MG tablet Take 5 mg by mouth daily. 06/17/19   [provider]   Allergies  Allergen Reactions  . Eliquis [Apixaban] Other (See Comments)    bleeding  . Demerol [Meperidine] Nausea And Vomiting  . Keppra [Levetiracetam] Other (See Comments)    Causes sleepiness, mental status changes  . Vimpat [Lacosamide] Other (See Comments)    Over sedated    Review of Systems  Unable to perform ROS: Acuity of condition    Physical Exam Vitals and nursing note reviewed.  Constitutional:      Appearance: He is ill-appearing.  Neurological:     Mental Status: He is disoriented.     Comments: lethargic     Vital Signs: BP (!) 155/77 (BP Location: Right Arm)   Pulse 95   Temp 98.5 F (36.9 C)   Resp 17   Ht _0  (1.753 m)   Wt 61.7 kg   SpO2 99%   BMI 20.09 kg/m  Pain Scale: Faces   Pain Score: 7    SpO2: SpO2: 99 % O2 Device:SpO2: 99 % O2 Flow Rate: .   IO: Intake/output summary:    Intake/Output Summary (Last 24 hours) at 05/31/2020 1609 Last data filed at 05/31/2020 1428 Gross per 24 hour  Intake 1602.58 ml  Output --  Net 1602.58 ml    LBM: Last BM Date: 05/31/20 Baseline Weight: Weight: 65.2 kg Most recent weight: Weight: 61.7 kg     Palliative Assessment/Data: PPS: 10%     Thank you for this consult. Palliative medicine will continue to follow and assist as needed.   Time In: 0379 Time Out: 1559 Time Total: 109 minutes Greater than 50%  of this time was spent counseling and coordinating care related to the above assessment and plan.  Signed by: Mariana Kaufman, AGNP-C Palliative Medicine    Please contact Palliative Medicine Team phone at [phone] for questions and concerns.  For individual provider: See Shea Evans

## 2020-05-31 NOTE — Progress Notes (Signed)
Physical Therapy Session Note  Patient Details  Name: [name] MRN: [id number] Date of Birth: 1933-08-04  Today's Date: 05/31/2020 PT Individual Time: 5462-7035 PT Individual Time Calculation (min): 34 min    Today's Date: 05/31/2020 PT Missed Time: 11 Minutes Missed Time Reason: Patient fatigue  Short Term Goals: Week 1:  PT Short Term Goal 1 (Week 1): pt will roll L and R with min A PT Short Term Goal 2 (Week 1): pt will transfer bed<>WC with LRAD and mod A of 1 PT Short Term Goal 3 (Week 1): pt will perform WC mobility 58ft with supervision  Skilled Therapeutic Interventions/Progress Updates:   Received pt supine in bed asleep with son present at bedside. Pt opened eyes briefly but kept eyes closed during ~95% of session. Pt denied any pain but reported fatigue and declined getting OOB or sitting EOB but was agreeable to bed level exercises with encouragement. Donned L non-skid sock with total A and pt performed the following exercises in supine with verbal and tactile cues for technique: -heel slides x8 on LLE -hip abduction x3 on LLE Pt required increased time with exercises due to tendency to fall asleep. When therapist asked pt if he wanted to continue exercising pt shook head "no". However, pt was agreeable to therapist providing HEP to son in case he had more energy later this afternoon. Provided pt/pt's son with HEP consisting of the following exercises and educated both on frequency/duration/technique: Supine Heel Slide - 1 x daily - 7 x weekly - 2 sets - 10 reps Supine Hip Abduction - 1 x daily - 7 x weekly - 2 sets - 10 reps Supine Straight Leg Raises - 1 x daily - 7 x weekly - 2 sets - 10 reps Supine Ankle Circles - 1 x daily - 7 x weekly - 2 sets - 10 reps Pt's son reported having Palliative care meeting at 2:30pm to discuss pt's current medical status and discharge plan. Concluded session with pt reclined in bed, needs within reach, and bed alarm on. Pt's son present at  bedside. Pillow positioned under L hip for pressure relief. 11 minutes missed of skilled physical therapy due to fatigue.   Therapy Documentation Precautions:  Precautions Precautions: Fall Precaution Comments: very thin skin, skin tears easily with multiple sites of skin breakdown; R neglect Restrictions Weight Bearing Restrictions: No  Therapy/Group: Individual Therapy Alfonse Alpers PT, DPT   05/31/2020, 7:15 AM

## 2020-05-31 NOTE — Progress Notes (Addendum)
Patient ID: [name], male   DOB: 25-Jul-1933, 87 y.o.   MRN: [id number]  Attemted to touch base with wife and no one is in patient's room. Will check back later. Wife had a MD appointent at 10:30 am today.  2:00 PM Wife meeting with palliative care today at 2:30 according to Dan-PA. Will work on equipment needs and await results of meeting.

## 2020-05-31 NOTE — Progress Notes (Signed)
PROGRESS NOTE   Subjective/Complaints: Plan for [facility] tomorrow Palliative care to see patient today.  Team conference today Patient feels a lot better- prefers comfort are rather than NPO  ROS: +severe chronic lower back pain  Objective:   CT HEAD WO CONTRAST  Result Date: 05/29/2020 CLINICAL DATA:  Lethargy, dysarthria and dysphagia. Left posterior frontal and parietal cortical and subcortical infarction. EXAM: CT HEAD WITHOUT CONTRAST TECHNIQUE: Contiguous axial images were obtained from the base of the skull through the vertex without intravenous contrast. COMPARISON:  MRI 05/25/2020 FINDINGS: Brain: No focal abnormality is seen affecting the pons or cerebellum. Cerebral hemispheres show generalized atrophy with chronic small-vessel ischemic changes of the cerebral hemispheric white matter. Acute cortical infarction in the left posterior frontal and parietal region can be appreciated on CT as cortical low-density. No swelling/mass effect or hemorrhage. No new insult identified. No hydrocephalus or extra-axial collection. Vascular: There is atherosclerotic calcification of the major vessels at the base of the brain. Skull: Negative Sinuses/Orbits: Clear/normal Other: None IMPRESSION: 1. Acute cortical infarction in the left posterior frontal and parietal region can be appreciated on CT as cortical low-density. No swelling/mass effect or hemorrhage. No new insult identified. 2. Atrophy and chronic small-vessel ischemic changes of the cerebral hemispheric white matter. Electronically Signed   By: Nelson Chimes M.D.   On: 05/29/2020 20:57   Recent Labs    05/30/20 0543 05/30/20 1000  WBC 8.1 9.3  HGB 10.3* 9.4*  HCT 32.3* 29.3*  PLT 122* 150   Recent Labs    05/30/20 0543 05/30/20 1000  NA 144 144  K 3.3* 3.4*  CL 112* 110  CO2 22 22  GLUCOSE 82 75  BUN 26* 25*  CREATININE 1.32* 1.41*  CALCIUM 7.8* 7.8*    Intake/Output  Summary (Last 24 hours) at 05/31/2020 0952 Last data filed at 05/31/2020 0810 Gross per 24 hour  Intake 1555.12 ml  Output -  Net 1555.12 ml        Physical Exam: Vital Signs Blood pressure (!) 147/89, pulse (!) 109, temperature 98 F (36.7 C), resp. rate 19, height 5\' 9"  (1.753 m), weight 61.7 kg, SpO2 99 %. Gen: no distress, normal appearing HEENT: oral mucosa pink and moist, NCAT Cardio: Tachycardic Chest: normal effort, normal rate of breathing Abd: soft, non-distended Ext: no edema Psych: pleasant, normal affect Skin: intact Musculoskeletal: LB tender with palpation. ROM. Did not seem to have much tenderness in right BK limb. Limb well shaped, shrinker sock    Cervical back: Normal range of motion and neck supple.     Comments: RUE- 3/5 in proximal muscles- 3+/5 distally--inconcistent LUE- 4+/5 inconsistent effort RLE- 3/5 in HF/KE/KF- has R BKA LLE- 4 to 4+/5 in LLE- in HF/KE/DF and PF  Neurological:     Comments: Patient is lethargic this morning.  Makes eye contact with examiner.  expressive language deficits. dsyarthria.  follows simple commands. Sensation Intact to light touch in UEs and LLE/R BKA   Assessment/Plan: 1. Functional deficits which require 3+ hours per day of interdisciplinary therapy in a comprehensive inpatient rehab setting.  Physiatrist is providing close team supervision and 24 hour management of active medical problems listed  below.  Physiatrist and rehab team continue to assess barriers to discharge/monitor patient progress toward functional and medical goals  Care Tool:  Bathing        Body parts bathed by helper: Right arm,Left arm,Abdomen,Chest     Bathing assist Assist Level: Total Assistance - Patient < 25% (pt refusing to engage in LB bathing/dressing today)     Upper Body Dressing/Undressing Upper body dressing   What is the patient wearing?: Hospital gown only    Upper body assist Assist Level: Dependent - Patient 0%    Lower  Body Dressing/Undressing Lower body dressing      What is the patient wearing?: Incontinence brief     Lower body assist Assist for lower body dressing: Dependent - Patient 0%     Toileting Toileting    Toileting assist Assist for toileting: Dependent - Patient 0%     Transfers Chair/bed transfer  Transfers assist     Chair/bed transfer assist level: 2 Helpers     Locomotion Ambulation   Ambulation assist   Ambulation activity did not occur: Safety/medical concerns          Walk 10 feet activity   Assist  Walk 10 feet activity did not occur: Safety/medical concerns (fatigue, generalized weakness, decreased balance)        Walk 50 feet activity   Assist Walk 50 feet with 2 turns activity did not occur: Safety/medical concerns (fatigue, generalized weakness, decreased balance)         Walk 150 feet activity   Assist Walk 150 feet activity did not occur: Safety/medical concerns (fatigue, generalized weakness, decreased balance)         Walk 10 feet on uneven surface  activity   Assist Walk 10 feet on uneven surfaces activity did not occur: Safety/medical concerns (fatigue, generalized weakness, decreased balance)         Wheelchair     Assist Will patient use wheelchair at discharge?: Yes Type of Wheelchair: Manual    Wheelchair assist level: Supervision/Verbal cueing Max wheelchair distance: 100ft    Wheelchair 50 feet with 2 turns activity    Assist        Assist Level: Total Assistance - Patient < 25%   Wheelchair 150 feet activity     Assist      Assist Level: Total Assistance - Patient < 25%   Blood pressure (!) 147/89, pulse (!) 109, temperature 98 F (36.7 C), resp. rate 19, height 5\' 9"  (1.753 m), weight 61.7 kg, SpO2 99 %.    Medical Problem List and Plan: 1.  Right upper extremity paresis with dysarthria and dysphagia secondary to left MCA distribution infarction             -patient may  Shower  with dressing covered on R BKA             -ELOS/Goals: w/c level/transfers d/c Thursday to home hospice  --Continue CIR therapies including PT, OT, and SLP as tolerated  Decreased to 15/7.  2.  Antithrombotics: -DVT/anticoagulation: Continue Pradaxa-discussed cost with wife and they will be able to afford.              -antiplatelet therapy: N/A 3. Severe chronic low back pain  IV Tylenol ordered, may give oral hydrocodone- wife and patient willing to accept aspiration risk.   -continue massage right stump  -have scheduled tylenol 500mg  tid  -kpad for back pain, add muscle rub also 4. Mood: Provide emotional support             -  antipsychotic agents: N/A 5. Neuropsych: This patient is not capable of making decisions on his own behalf. 6. Skin/Wound Care: Routine skin checks. Air mattress ordered, Gerhardt's butt cream ordered for MASD.  7. Fluids/Electrolytes/Nutrition: see #8 8.  Dysphagia 1.  Dysphagia #1 nectar thick liquid diet follow-up speech therapy  -continue IVF supplementation   -check labs including prealbumin given lethargy  -MBS tomorrow 9.  Atrial fibrillation.  Diltiazem 240 mg daily.  Cardiac rate controlled 10.  CAD with stenting.  Continue Pradaxa.  No chest pain or shortness of breath 11.  CKD stage III.  Baseline creatinine 1.39-1.60.  Follow-up chemistries 12.  Seizure disorder.  Depakote 500 mg twice daily 13.  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  Continue shrinker.  -wound looks fine 14.  Hypothyroidism.  Synthroid 15.  Hypertension.  Cardura 4 mg nightly.  Continue Lopressor 25 mg twice daily  3/19 controlled 16  GERD.  Protonix 17.  Hyperlipidemia.  Lipitor 18.  Diastolic congestive heart failure.  Lasix 20 mg daily.  Monitor for any signs of fluid overload   Filed Weights   05/26/20 1500 05/30/20 0500  Weight: 65.2 kg 61.7 kg    3/21 -weight down signficantly.   -CXR ordered: results discussed with patient and wife: shows  pneumonia  -hold lasix d/t sporadic intake 19.  Rheumatoid arthritis.  Continue chronic prednisone 20. L AC fossa IV d/c'd- active bleeding x10+ minutes- pressure applied to area yesterday. No active bleeding today 21. Anemia: Hgb 10.2-->9.7: check labs Monday 22. Pneumonia: Continue IV Zosyn. WBC normal today. Repeat tomorrow.  23. Disposition: wife is interested in home hospice. Palliative care consulted and will see patient today. Wife would like son and patient to be part of the conversation as well.     LOS: 5 days A FACE TO FACE EVALUATION WAS PERFORMED  Clide Deutscher Raulkar 05/31/2020, 9:52 AM

## 2020-05-31 NOTE — Patient Care Conference (Signed)
Inpatient RehabilitationTeam Conference and Plan of Care Update Date: 05/31/2020   Time: 9:50 AM    Patient Name: [name]      Medical Record Number: [id number]  Date of Birth: June 07, 1933 Sex: Male         Room/Bed: 4M08C/4M08C-01 Payor Info: Payor: MEDICARE / Plan: MEDICARE PART A AND B / Product Type: *No Product type* /    Admit Date/Time:  05/26/2020  2:36 PM  Primary Diagnosis:  Acute right hemiparesis [facility])  Hospital Problems: Principal Problem:   Acute right hemiparesis ([place]) Active Problems:   Chronic diastolic (congestive) heart failure (HCC)   Anemia of chronic disease   Chronic atrial fibrillation   S/P BKA (below knee amputation) unilateral, right ([place])   Stroke (cerebrum) ([place])   Oropharyngeal dysphagia   Lethargy    Expected Discharge Date: Expected Discharge Date: 06/02/20  Team Members Present: Physician leading conference: Dr. Leeroy Cha Care Coodinator Present: Dorthula Nettles, RN, BSN, CRRN;Becky Dupree, LCSW Nurse Present: Dorthula Nettles, RN PT Present: Becky Sax, PT OT Present: Simonne Come, OT SLP Present: Jettie Booze, CF-SLP PPS Coordinator present : Gunnar Fusi, SLP     Current Status/Progress Goal Weekly Team Focus  Bowel/Bladder   Pt is incont x2, LBM 05/31/20  Pt will maintain normal bowel pattern  q2h toileting/prn   Swallow/Nutrition/ Hydration   NPO, RLL PNA, very weak manipulation and transit of boluses, difficulty triggering swallow and it is suspected delayed  Supervision - downgrade?  MBSS when medically stable and able to maintain alertness   ADL's   Max- total bathing and dressing, Max assist slide board transfer with PT.  Pt recently with increased lethargy and decreased participation.  Min assist toilet transfer and UB dressing, Max assist LB dressing, Supervision grooming and self-feeding - will most likely need to downgrade and d/c some goals depending on goals of care s/p palliative consult  ADL retraining, RUE NMR,  sitting balance, transfers, d/c planning   Mobility   rolling L/R min/mod A, supine<>sit and sit<>supine mod A, slideboard transfers mod A  min A overall, supervision bed mobility and WC mobility  bed mobility, transfers, OOB tolerance, generalized strengthening   Communication             Safety/Cognition/ Behavioral Observations  Mod-Max  Min  basic familiar problem solving and awareness   Pain   Pt c/o lower back pain. Pt refusing IV tylenol. Pt requesting Norco  Pt will be pain free  Provider will be made aware of pts concerns.   Skin   Pt has multiple abrasions and bruising to arms.  pt will be free of breakdown, and further infection  Assess skin qshift     Discharge Planning:  Awaiting Palliative care reaching out to wife, to meet with to discuss appropriate of hospice at home, goals of care, etc. Pt medically declined 3/21 and made NPO.   Team Discussion: Has IV Tylenol available but patient prefers crushed Norco with applesauce. Family is aware of aspiration risk. Getting Hospice involved so patient can have necessary equipment at home and can be discharged in the next couple of days. Patient is actively dying and family would like to take him home. Incontinent B/B, complains of 8/10 pain, Norco given q 4hr. Sleeping on and off, scrotum is red and barrier cream is being applied. Wife is doing dressing changes to residual limb per order.  Patient on target to meet rehab goals: Yes, min/mod assist for rolling in bed, has min assist  goals. Wife is doing most everything for bathing and dressing and ADL's. SLP will do a MBS tomorrow.   *See Care Plan and progress notes for long and short-term goals.   Revisions to Treatment Plan:  Hospice consultation.  Teaching Needs: Family education, medication management, pain management, skin/wound care, transfer training, safety training.  Current Barriers to Discharge: Inaccessible home environment, Decreased caregiver support, Medical  stability, Incontinence, Wound care, Lack of/limited family support, Medication compliance, Behavior and Nutritional means  Possible Resolutions to Barriers: Continue current medications, provide emotional support.     Medical Summary Current Status: pneumonia, WBC normal, afebrile, cognition has improved with zosyn, severe chronic low back pain  Barriers to Discharge: Medical stability;Decreased family/caregiver support  Barriers to Discharge Comments: pneumonia, high aspiration risk, severe chronic low back pain Possible Resolutions to [employer]: continue zosyn, WBC normal, afebrile, cognition has improved with zosyn, NPO but may have feeds for comfort care, palliative care consulted, continue norco and IV tylenol PRN   Continued Need for Acute Rehabilitation Level of Care: The patient requires daily medical management by a physician with specialized training in physical medicine and rehabilitation for the following reasons: Direction of a multidisciplinary physical rehabilitation program to maximize functional independence : Yes Medical management of patient stability for increased activity during participation in an intensive rehabilitation regime.: Yes Analysis of laboratory values and/or radiology reports with any subsequent need for medication adjustment and/or medical intervention. : Yes   I attest that I was present, lead the team conference, and concur with the assessment and plan of the team.   Cristi Loron 05/31/2020, 1:45 PM

## 2020-05-31 NOTE — Patient Outreach (Signed)
[facility]) Care Management  05/31/2020  HARSHITH PURSELL 1934-03-06 [id number]  Patient was admitted to the hospital 314/22. Nurse Health Coach will perform case closure and transfer patient to the [place] Team. Nurse has informed [facility] Coordinator of patient's admission.    Plan: RN Health Coach Discipline Closure Discipline Closure letter sent to PCP   Emelia Loron RN, [place] [phone] [email or web address]

## 2020-05-31 NOTE — Progress Notes (Signed)
Occupational Therapy Note  Patient Details  Name: [name] MRN: [id number] Date of Birth: 04-10-1933  Today's Date: 05/31/2020 OT Missed Time: 52 Minutes Missed Time Reason: Unavailable (comment) (palliative care consult)  Pt missed 30 mins scheduled OT treatment session due to palliative care consult arriving at scheduled OT session.  Patient, wife, and adult son meeting with palliative care during scheduled session.    Simonne Come 05/31/2020, 3:17 PM

## 2020-05-31 NOTE — Progress Notes (Signed)
Speech Language Pathology Daily Session Note  Patient Details  Name: [name] MRN: [id number] Date of Birth: 02/21/1934  Today's Date: 05/31/2020 SLP Individual Time: 0900-0950 SLP Individual Time Calculation (min): 50 min  Short Term Goals: Week 1: SLP Short Term Goal 1 (Week 1): Patient will participate in BSE to determine need for MBS. SLP Short Term Goal 2 (Week 1): Patient will achieve approximately 80% intelligibility at 3-4 word phrase level with modA SLP Short Term Goal 3 (Week 1): Patient will name objects and verb pictures/photos with minA. SLP Short Term Goal 4 (Week 1): Patient will consume current diet Dys 1, nectar liquids with minimal s/s aspiration, penetration SLP Short Term Goal 5 (Week 1): Patient will recall and describe daily events at basic/general level with modA.  Skilled Therapeutic Interventions: Pt was seen for skilled ST targeting dysphagia and more family education/training with pt's wife and son at bedside. Pt was more alert today, able to maintain arousal for entire 50 minute session. Pt's wife expressed she still did not feel "trained" in oral care via suction brush, therefore demonstration and explanation provided again. Pt's wife was actively engaged in the process and voiced that she felt she could perform oral care now. Pt then accepted trials of ice chips from SLP with 1 cough response and intermittent wet vocal quality. Although s/sx concerning for aspiration still present, pt is now appropriate to be able to participate in MBSS, which pt and family express they would prefer to happen ASAP to inform their decisions about care at home. Will schedule MBSS for tomorrow 3/23 in order to provide recommendations to family regarding safest least restrictive PO diet. Pt left laying in bed with needs within reach and RN and son present. Continue per current plan of care.        Pain Pain Assessment Pain Scale: Faces Faces Pain Scale: No  hurt   Therapy/Group: Individual Therapy  Arbutus Leas 05/31/2020, 7:23 AM

## 2020-05-31 NOTE — Progress Notes (Signed)
Wife requested multiple times throughout night for pt to receive additional pain medication. However, pt initially refused all scheduled IV Tylenol doses. This RN and pt's nurse, Tiffany, LPN reviewed inability to administer any medication by mouth per current orders. Specifically referring to SLP note from 3/21 stating, "Would recommend pt continue NPO with ice chips ONLY with SLP present at this time."  This RN and pt's assigned nurse, Tiffany, [place] educated pt and wife extensively on current NPO orders, medication regimen, MD and SLP notes. NPO order also remains unchanged by MD. Nothing was given to pt by mouth.  Encouraged pt and wife to discuss pain management concerns with SLP and MD during rounding to make a plan of care. Wife and pt advised multiple times that RN is unable to change diet orders at this time, and that it would be unsafe to do so.   After further discussion with wife, pt agreeable to try IV Tylenol.   Pt repositioned, lidocaine patches applied, and heat packs offered.  Wife verbalized understanding of education, and remains at bedside.   Charge RN made aware of the multiple concerns.

## 2020-05-31 NOTE — Discharge Summary (Signed)
Physician Discharge Summary  Patient ID: [name] MRN: [id number] DOB/AGE: Jan 27, 1934 87 y.o.  Admit date: 05/26/2020 Discharge date: 05/10/2020  Discharge Diagnoses:  Principal Problem:   Acute right hemiparesis ([place]) Active Problems:   Chronic diastolic (congestive) heart failure (HCC)   Anemia of chronic disease   Chronic atrial fibrillation   S/P BKA (below knee amputation) unilateral, right (HCC)   Stroke (cerebrum) (HCC)   Oropharyngeal dysphagia   Lethargy Hypothyroidism Hypertension GERD Hyperlipidemia Failure to thrive Rheumatoid arthritis Pneumonia CAD with stenting CKD stage III  Discharged Condition: Stable  Significant Diagnostic Studies: CT ANGIO HEAD W OR WO CONTRAST  Result Date: 05/23/2020 CLINICAL DATA:  87 year old male code stroke presentation with left MCA territory implicated. Also history of seizures. EXAM: CT ANGIOGRAPHY HEAD AND NECK TECHNIQUE: Multidetector CT imaging of the head and neck was performed using the standard protocol during bolus administration of intravenous contrast. Multiplanar CT image reconstructions and MIPs were obtained to evaluate the vascular anatomy. Carotid stenosis measurements (when applicable) are obtained utilizing NASCET criteria, using the distal internal carotid diameter as the denominator. CONTRAST:  22mL OMNIPAQUE IOHEXOL 350 MG/ML SOLN COMPARISON:  CTA head and neck 03/24/2018. Plain head CT 0857 hours today. FINDINGS: CTA NECK Skeleton: Chronic severe cervical spine degeneration status post long segment anterior and posterior fusion. Bulky degenerative ligamentous hypertrophy about the odontoid. Evidence of moderate cervicomedullary junction spinal stenosis, also evident by MRI in 2020. No acute osseous abnormality identified. Upper chest: Lower lung volumes with atelectasis. Negative visible superior mediastinum. Other neck: Small volume retained secretions in the pharynx. No other acute neck soft tissue finding.  Aortic arch: Stable aortic arch with mild atherosclerosis. Generalized tortuosity of the great vessels. Right carotid system: Tortuous brachiocephalic artery and especially right CCA origin without stenosis. Streak artifact at the right ICA bulb. Relatively mild origin and bulb atherosclerosis with no significant stenosis. Partially retropharyngeal course of the right ICA which remains patent to the skull base. Left carotid system: Tortuous proximal left CCA with a kinked appearance at the thoracic inlet but no stenosis. Mild plaque at the left ICA origin. Moderate calcified plaque at the bulb but no significant stenosis. The left ICA remains patent to the skull base. Vertebral arteries: Tortuosity and calcified plaque at the proximal right subclavian artery without significant stenosis. Chronic bulky calcified plaque and severe stenosis at the right vertebral artery origin. The right vertebral does remain patent to the skull base, is highly tortuous in the V2 segment. Tortuous proximal left subclavian artery with calcified plaque but no significant stenosis. Calcified plaque at the left vertebral artery origin resulting in mild or at most moderate stenosis. Tortuous left vertebral artery in the V2 segment, remains patent to the skull base. CTA HEAD Posterior circulation: Bilateral V4 segment calcified plaque with moderate to severe V4 segment stenosis greater on the right (series 6, image 297). Both distal V4 segments remain patent to the basilar. Right PICA origin remains patent. Patent basilar artery with mild irregularity and no significant stenosis. Patent SCA origins. Fetal type bilateral PCA is again noted. Bilateral PCA branches appears stable and mildly irregular. Anterior circulation: Both ICA siphons are patent. Tortuous bilateral siphons with extensive calcified plaque but only mild supraclinoid stenosis on the right. Bilateral ophthalmic and posterior communicating artery origins remain normal. Patent  carotid termini, MCA and ACA origins with dominant left and diminutive or absent right ACA A1 segment. Anterior communicating artery and bilateral ACA branches appear stable since 2020. Right MCA M1 segment and  bifurcation remain patent. Right MCA branches appear stable since 2020. Left MCA M1 segment and left MCA trifurcation is patent with mild irregularity but no significant stenosis. Left MCA M2 branches appears stable since 2020. And allowing for different contrast timing today, the proximal M3 branches also seem to remain patent. Venous sinuses: Early contrast timing, but grossly patent. Anatomic variants: Fetal type PCA origins, dominant left ACA A1. Review of the MIP images confirms the above findings IMPRESSION: 1. Negative for emergent large vessel occlusion. Left MCA branches appear stable from a 2020 CTA. This was reviewed in person with Dr. Roland Rack at [phone] hours. 2. Positive for chronic arterial tortuosity and calcified plaque in the head and neck. 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 - Moderate Left Vertebral V4 segment. 3. Chronic very severe cervical spine degeneration. 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 Electronically Signed   By: [name] M.D.   On: 05/23/2020 09:50   DG Chest 2 View  Result Date: 05/29/2020 CLINICAL DATA:  Cough, chest pain, shortness of breath EXAM: CHEST - 2 VIEW COMPARISON:  05/23/2020 FINDINGS: The chin obscures the left lung apex. Low lung volumes are present, causing crowding of the pulmonary vasculature. Borderline elevated left hemidiaphragm. Left lower lobe airspace opacity most striking on the lateral projection, compatible with pneumonia or atelectasis. Borderline cardiomegaly. No edema. The right lung appears clear. Thoracic spondylosis. IMPRESSION: 1. Left lower lobe airspace opacity compatible with pneumonia or atelectasis. 2. Low  lung volumes. Electronically Signed   By: [name] M.D.   On: 05/29/2020 13:59   CT HEAD WO CONTRAST  Result Date: 05/29/2020 CLINICAL DATA:  Lethargy, dysarthria and dysphagia. Left posterior frontal and parietal cortical and subcortical infarction. EXAM: CT HEAD WITHOUT CONTRAST TECHNIQUE: Contiguous axial images were obtained from the base of the skull through the vertex without intravenous contrast. COMPARISON:  MRI 05/25/2020 FINDINGS: Brain: No focal abnormality is seen affecting the pons or cerebellum. Cerebral hemispheres show generalized atrophy with chronic small-vessel ischemic changes of the cerebral hemispheric white matter. Acute cortical infarction in the left posterior frontal and parietal region can be appreciated on CT as cortical low-density. No swelling/mass effect or hemorrhage. No new insult identified. No hydrocephalus or extra-axial collection. Vascular: There is atherosclerotic calcification of the major vessels at the base of the brain. Skull: Negative Sinuses/Orbits: Clear/normal Other: None IMPRESSION: 1. Acute cortical infarction in the left posterior frontal and parietal region can be appreciated on CT as cortical low-density. No swelling/mass effect or hemorrhage. No new insult identified. 2. Atrophy and chronic small-vessel ischemic changes of the cerebral hemispheric white matter. Electronically Signed   By: [name] M.D.   On: 05/29/2020 20:57   CT HEAD WO CONTRAST  Result Date: 05/24/2020 CLINICAL DATA:  Stroke follow-up. EXAM: CT HEAD WITHOUT CONTRAST TECHNIQUE: Contiguous axial images were obtained from the base of the skull through the vertex without intravenous contrast. COMPARISON:  May 23, 2020 FINDINGS: Brain: Findings concerning for left perirolandic acute ischemia were better characterized on prior MRI. No substantial edema or mass effect in this region. No evidence of interval/new acute large vascular territory infarct. No mass occupying  hemorrhagic transformation. Similar generalized cerebral volume loss with ex vacuo ventricular dilation and chronic microvascular ischemic disease. No evidence of a mass lesion or abnormal mass effect. Vascular: Calcific atherosclerosis. No hyperdense vessel identified. Skull: No acute fracture. Sinuses/Orbits: Clear sinuses.  Unremarkable orbits. Other: No mastoid  effusions. IMPRESSION: Findings concerning for left perirolandic acute ischemia were better characterized on prior MRI. No substantial edema or mass effect in this region. No mass occupying hemorrhagic transformation. Electronically Signed   By: Robert Sheffield MD   On: 05/24/2020 11:40   CT ANGIO NECK W OR WO CONTRAST  Result Date: 05/23/2020 CLINICAL DATA:  87 year old male code stroke presentation with left MCA territory implicated. Also history of seizures. EXAM: CT ANGIOGRAPHY HEAD AND NECK TECHNIQUE: Multidetector CT imaging of the head and neck was performed using the standard protocol during bolus administration of intravenous contrast. Multiplanar CT image reconstructions and MIPs were obtained to evaluate the vascular anatomy. Carotid stenosis measurements (when applicable) are obtained utilizing NASCET criteria, using the distal internal carotid diameter as the denominator. CONTRAST:  67mL OMNIPAQUE IOHEXOL 350 MG/ML SOLN COMPARISON:  CTA head and neck 03/24/2018. Plain head CT 0857 hours today. FINDINGS: CTA NECK Skeleton: Chronic severe cervical spine degeneration status post long segment anterior and posterior fusion. Bulky degenerative ligamentous hypertrophy about the odontoid. Evidence of moderate cervicomedullary junction spinal stenosis, also evident by MRI in 2020. No acute osseous abnormality identified. Upper chest: Lower lung volumes with atelectasis. Negative visible superior mediastinum. Other neck: Small volume retained secretions in the pharynx. No other acute neck soft tissue finding. Aortic arch: Stable aortic arch with  mild atherosclerosis. Generalized tortuosity of the great vessels. Right carotid system: Tortuous brachiocephalic artery and especially right CCA origin without stenosis. Streak artifact at the right ICA bulb. Relatively mild origin and bulb atherosclerosis with no significant stenosis. Partially retropharyngeal course of the right ICA which remains patent to the skull base. Left carotid system: Tortuous proximal left CCA with a kinked appearance at the thoracic inlet but no stenosis. Mild plaque at the left ICA origin. Moderate calcified plaque at the bulb but no significant stenosis. The left ICA remains patent to the skull base. Vertebral arteries: Tortuosity and calcified plaque at the proximal right subclavian artery without significant stenosis. Chronic bulky calcified plaque and severe stenosis at the right vertebral artery origin. The right vertebral does remain patent to the skull base, is highly tortuous in the V2 segment. Tortuous proximal left subclavian artery with calcified plaque but no significant stenosis. Calcified plaque at the left vertebral artery origin resulting in mild or at most moderate stenosis. Tortuous left vertebral artery in the V2 segment, remains patent to the skull base. CTA HEAD Posterior circulation: Bilateral V4 segment calcified plaque with moderate to severe V4 segment stenosis greater on the right (series 6, image 297). Both distal V4 segments remain patent to the basilar. Right PICA origin remains patent. Patent basilar artery with mild irregularity and no significant stenosis. Patent SCA origins. Fetal type bilateral PCA is again noted. Bilateral PCA branches appears stable and mildly irregular. Anterior circulation: Both ICA siphons are patent. Tortuous bilateral siphons with extensive calcified plaque but only mild supraclinoid stenosis on the right. Bilateral ophthalmic and posterior communicating artery origins remain normal. Patent carotid termini, MCA and ACA origins  with dominant left and diminutive or absent right ACA A1 segment. Anterior communicating artery and bilateral ACA branches appear stable since 2020. Right MCA M1 segment and bifurcation remain patent. Right MCA branches appear stable since 2020. Left MCA M1 segment and left MCA trifurcation is patent with mild irregularity but no significant stenosis. Left MCA M2 branches appears stable since 2020. And allowing for different contrast timing today, the proximal M3 branches also seem to remain patent. Venous sinuses: Early contrast timing,  but grossly patent. Anatomic variants: Fetal type PCA origins, dominant left ACA A1. Review of the MIP images confirms the above findings IMPRESSION: 1. Negative for emergent large vessel occlusion. Left MCA branches appear stable from a 2020 CTA. This was reviewed in person with Dr. Roland Rack at [phone] hours. 2. Positive for chronic arterial tortuosity and calcified plaque in the head and neck. 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 - Moderate Left Vertebral V4 segment. 3. Chronic very severe cervical spine degeneration. 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 Electronically Signed   By: [name] M.D.   On: 05/23/2020 09:50   MR BRAIN WO CONTRAST  Result Date: 05/25/2020 CLINICAL DATA:  Stroke follow-up EXAM: MRI HEAD WITHOUT CONTRAST TECHNIQUE: Multiplanar, multiecho pulse sequences of the brain and surrounding structures were obtained without intravenous contrast. COMPARISON:  CT head 05/24/2020.  MRI head 05/23/2020 FINDINGS: Brain: Restricted diffusion in the left perirolandic cortex compatible with acute infarct. Diffusion-weighted imaging is strongly positive and has progressed since the prior study. The distribution of infarct is unchanged from 2 days ago. No associated hemorrhage. There is now FLAIR hyperintensity in the cortex in the area of  infarction. Moderate to advanced atrophy. Chronic microvascular ischemic changes in the white matter. No hemorrhage or mass lesion. Vascular: Normal arterial flow voids Skull and upper cervical spine: Anterior and posterior cervical spine fusion. Destructive process involving the dens with large amount of surrounding soft tissue. There is moderate spinal stenosis at the craniocervical junction with displacement of the cord and cord deformity. No interval change. Sinuses/Orbits: Paranasal sinuses clear. Bilateral cataract extraction Other: None IMPRESSION: Acute infarct in the left Perirolandic cortex. Diffusion-weighted imaging is more strongly positive on today's study than the recent MRI. There is now FLAIR hyperintensity in the cortex in this area. No associated hemorrhage Extensive atrophy. Chronic microvascular ischemic change in the white matter Destructive process of the dens with extensive surrounding soft tissue thickening posterior dens causing moderate spinal stenosis. Question rheumatoid arthritis, CPPD Electronically Signed   By: [name] M.D.   On: 05/25/2020 11:36   MR BRAIN WO CONTRAST  Result Date: 05/23/2020 CLINICAL DATA:  87 year old male code stroke presentation with left MCA territory implicated. Also history of seizures. EXAM: MRI HEAD WITHOUT CONTRAST TECHNIQUE: Multiplanar, multiecho pulse sequences of the brain and surrounding structures were obtained without intravenous contrast. COMPARISON:  CT head and CTA head and neck today. Prior brain MRI 03/25/2018. FINDINGS: Abbreviated exam by design due to code stroke presentation and IV tPA candidate. Brain: Patchy abnormal diffusion at the left superior perirolandic cortex and involving some subcortical white matter (series 2, image 40 and ADC map series 250, image 39. This more resembles acute ischemia than postictal sequelae. Right face representation area seems most affected. There is currently no associated FLAIR hyperintensity.  No convincing T2 hyperintensity. No other abnormal diffusion. Patchy and confluent bilateral cerebral white matter T2 and FLAIR hyperintensity appears stable since 2020. Expected evolution of the punctate right cerebellar infarct that year. No midline shift, mass effect, evidence of mass lesion, ventriculomegaly, extra-axial collection or acute intracranial hemorrhage. Cervicomedullary junction and pituitary are within normal limits. No chronic cerebral blood products identified. Vascular: Major intracranial vascular flow voids are stable since 2020. Skull and upper cervical spine: Chronic severe cervical spine degeneration also demonstrated by CTA today. Bulky chronic ligamentous hypertrophy about the odontoid contributing to multifactorial cervicomedullary junction and C1 level spinal stenosis (series  4, image 13 today). Hardware susceptibility artifact in the cervical spine. Underlying bone marrow signal appears normal. Sinuses/Orbits: Stable, negative. Other: Mastoids remain clear. Visible internal auditory structures appear normal. Postoperative changes to the suboccipital soft tissues. IMPRESSION: 1. Patchy abnormal diffusion at the left perirolandic cortex most resembling acute ischemia. No T2 FLAIR changes or hemorrhage, mass effect at this time. This was discussed in person with Dr. Roland Rack at [phone] hours. 2. Otherwise stable chronic small vessel disease since the 2020 MRI. 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 Electronically Signed   By: [name] M.D.   On: 05/23/2020 09:58   DG Chest Port 1 View  Result Date: 05/23/2020 CLINICAL DATA:  Cough.  History of aspiration pneumonia. EXAM: PORTABLE CHEST 1 VIEW COMPARISON:  Radiograph 01/07/2020 FINDINGS: Patient is rotated and leaning to the right. Lung volumes are low. Cardiomegaly with aortic atherosclerosis. Mild left basilar scarring, similar to prior. No acute airspace  disease. There may be trace pleural effusions. No pulmonary edema. Surgical hardware in the cervical spine is partially included. IMPRESSION: 1. Low lung volumes with left basilar scarring. No new airspace disease. 2. Cardiomegaly with aortic atherosclerosis. 3. Possible trace pleural effusions. Electronically Signed   By: [name] M.D.   On: 05/23/2020 20:16   EEG adult  Result Date: 05/24/2020 Robert Havens, MD     05/24/2020  8:47 AM Patient Name: [name] MRN: [id number] Epilepsy Attending: Lora [name] Referring Physician/Provider: Dr. Roland Rack Date: 05/23/2020 Duration: 23.19 mins Patient history: 87 yo male who presented as a code stroke due to aphasia, right sided weakness, left gaze preference.  EEG to evaluate for seizure. Level of alertness: Awake, asleep AEDs during EEG study: VPA Technical aspects: This EEG study was done with scalp electrodes positioned according to the 10-20 International system of electrode placement. Electrical activity was acquired at a sampling rate of 500Hz  and reviewed with a high frequency filter of 70Hz  and a low frequency filter of 1Hz . EEG data were recorded continuously and digitally stored. Description: The posterior dominant rhythm consists of 7.5 Hz activity of moderate voltage (25-35 uV) seen predominantly in posterior head regions, symmetric and reactive to eye opening and eye closing. Sleep was characterized by sleep spindles (12 to 14 Hz), maximal frontocentral region.  EEG also showed continuous generalized 3 to 7 Hz theta-delta slowing.  Hyperventilation and photic stimulation were not performed.   ABNORMALITY - Continuous slow, generalized IMPRESSION: This study is suggestive of mild to moderate diffuse encephalopathy, nonspecific etiology.  No seizures or epileptiform discharges were seen throughout the recording. [name]   ECHOCARDIOGRAM COMPLETE  Result Date: 05/23/2020    ECHOCARDIOGRAM REPORT   Patient Name:    [name] Date of Exam: 05/23/2020 Medical Rec #:  [id number]      Height:       69.0 in Accession #:    [id number]     Weight:       154.5 lb Date of Birth:  [birth year]      BSA:          1.852 m Patient Age:    35 years       BP:           137/74 mmHg Patient Gender: M              HR:           75 bpm. Exam Location:  Inpatient Procedure: 2D Echo, Cardiac Doppler, Color Doppler and Intracardiac            Opacification Agent Indications:    Stroke  History:        Patient has prior history of Echocardiogram examinations, most                 recent 03/25/2018. TIA, Arrythmias:Atrial Fibrillation; Risk                 Factors:Former Smoker and Hypertension. GERD.  Sonographer:    Clayton Lefort RDCS (AE) Referring Phys: [phone] [facility]  Sonographer Comments: Technically difficult study due to poor echo windows and suboptimal subcostal window. IMPRESSIONS  1. Left ventricular ejection fraction, by estimation, is 55 to 60%. The left ventricle has normal function. The left ventricle has no regional wall motion abnormalities. There is severe concentric left ventricular hypertrophy. Left ventricular diastolic  function could not be evaluated.  2. Right ventricular systolic function is normal. The right ventricular size is normal.  3. Left atrial size was moderately dilated.  4. Right atrial size was mildly dilated.  5. The mitral valve is grossly normal. Mild mitral valve regurgitation.  6. The aortic valve is grossly normal. Aortic valve regurgitation is trivial. FINDINGS  Left Ventricle: Left ventricular ejection fraction, by estimation, is 55 to 60%. The left ventricle has normal function. The left ventricle has no regional wall motion abnormalities. Definity contrast agent was given IV to delineate the left ventricular  endocardial borders. The left ventricular internal cavity size was small. There is severe concentric left ventricular hypertrophy. Left ventricular diastolic function could not be evaluated  due to atrial fibrillation. Left ventricular diastolic function  could not be evaluated. Right Ventricle: The right ventricular size is normal. No increase in right ventricular wall thickness. Right ventricular systolic function is normal. Left Atrium: Left atrial size was moderately dilated. Right Atrium: Right atrial size was mildly dilated. Pericardium: There is no evidence of pericardial effusion. Mitral Valve: The mitral valve is grossly normal. Mild mitral valve regurgitation. Tricuspid Valve: The tricuspid valve is grossly normal. Tricuspid valve regurgitation is mild. Aortic Valve: The aortic valve is grossly normal. Aortic valve regurgitation is trivial. Aortic valve mean gradient measures 2.0 mmHg. Aortic valve peak gradient measures 3.5 mmHg. Aortic valve area, by VTI measures 2.87 cm. Pulmonic Valve: The pulmonic valve was grossly normal. Pulmonic valve regurgitation is trivial. Aorta: The aortic root and ascending aorta are structurally normal, with no evidence of dilitation. IAS/Shunts: The atrial septum is grossly normal.  LEFT VENTRICLE PLAX 2D LVIDd:         3.00 cm LVIDs:         2.20 cm LV PW:         1.90 cm LV IVS:        1.90 cm LVOT diam:     1.90 cm LV SV:         45 LV SV Index:   24 LVOT Area:     2.84 cm  RIGHT VENTRICLE RV Basal diam:  2.80 cm RV S prime:     11.20 cm/s TAPSE (M-mode): 1.4 cm LEFT ATRIUM           Index       RIGHT ATRIUM           Index LA diam:      4.00 cm 2.16 cm/m  RA Area:     20.70 cm LA Vol (A2C): 52.4 ml 28.30  ml/m RA Volume:   48.50 ml  26.19 ml/m LA Vol (A4C): 81.7 ml 44.12 ml/m  AORTIC VALVE AV Area (Vmax):    2.57 cm AV Area (Vmean):   2.43 cm AV Area (VTI):     2.87 cm AV Vmax:           94.20 cm/s AV Vmean:          63.733 cm/s AV VTI:            0.156 m AV Peak Grad:      3.5 mmHg AV Mean Grad:      2.0 mmHg LVOT Vmax:         85.30 cm/s LVOT Vmean:        54.600 cm/s LVOT VTI:          0.158 m LVOT/AV VTI ratio: 1.01  AORTA Ao Root diam: 3.90 cm  Ao Asc diam:  3.30 cm  SHUNTS Systemic VTI:  0.16 m Systemic Diam: 1.90 cm Robert Moores MD Electronically signed by Robert Moores MD Signature Date/Time: 05/23/2020/4:44:58 PM    Final    CT HEAD CODE STROKE WO CONTRAST  Result Date: 05/23/2020 CLINICAL DATA:  Code stroke.  87 year old male. EXAM: CT HEAD WITHOUT CONTRAST TECHNIQUE: Contiguous axial images were obtained from the base of the skull through the vertex without intravenous contrast. COMPARISON:  Brain MRI 03/25/2018.  Head CT 03/24/2018. FINDINGS: Brain: No acute intracranial hemorrhage identified. No midline shift, mass effect, or evidence of intracranial mass lesion. Stable ventricle size and configuration. Patchy and confluent bilateral white matter hypodensity does not appear significantly changed since 2020. No cortically based acute infarct identified. No cortical encephalomalacia identified. Vascular: Extensive Calcified atherosclerosis at the skull base. No suspicious intracranial vascular hyperdensity. Skull: No acute osseous abnormality identified. Sinuses/Orbits: Visualized paranasal sinuses and mastoids are clear. Other: No acute orbit or scalp soft tissues. ASPECTS [facility] Stroke Program Early CT Score) Total score (0-10 with 10 being normal): 10 IMPRESSION: 1. No acute cortically based infarct or acute intracranial hemorrhage identified. ASPECTS 10. 2. Chronic white matter disease appears stable since 2020. 3. These results were communicated to Dr. Leonel Ramsay at 9:17 am on 05/23/2020 by text page via the [facility] messaging system. Electronically Signed   By: [name] M.D.   On: 05/23/2020 09:17    Labs:  Basic Metabolic Panel: Recent Labs  Lab 05/29/20 1152 05/30/20 0543 05/30/20 1000  NA 141 144 144  K 3.2* 3.3* 3.4*  CL 109 112* 110  CO2 21* 22 22  GLUCOSE 99 82 75  BUN 27* 26* 25*  CREATININE 1.45* 1.32* 1.41*  CALCIUM 7.7* 7.8* 7.8*    CBC: Recent Labs  Lab 05/29/20 1152 05/30/20 0543 05/30/20 1000  WBC 12.7*  8.1 9.3  NEUTROABS  --   --  7.6  HGB 9.8* 10.3* 9.4*  HCT 30.6* 32.3* 29.3*  MCV 102.0* 102.5* 100.3*  PLT 131* 122* 150    CBG: No results for input(s): GLUCAP in the last 168 hours.  Family history.  Mother with hypertension as well as cancer.  Father with kidney failure and heart disease.  Denies any colon cancer esophageal cancer or rectal cancer  Brief HPI:   ARHAAN CHESNUT is a 87 y.o. 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  Per chart review patient lives with spouse.  He is retired Software engineer.  Patient used Teacher, adult education in and out of wheelchair no longer using prosthesis due to skin issues of BKA.  Wife assist with bathing and dressing.  Presented 05/23/2020 with right side weakness left gaze preference and inability to speak.  Cranial CT scan negative for acute changes.  CT angiogram of head and neck negative for emergent large vessel occlusion.  Left MCA branches appeared stable from 2020 CTA.  Patient did receive TPA.  EEG negative for seizure but suggestive of mild to moderate diffuse encephalopathy.  MRI showed patchy abnormal diffusion at the left perirolandic cortex most resembling acute ischemia.  No substantial edema or mass-effect in that region.  Echocardiogram with ejection fraction of 55 to 60% no wall motion abnormalities.  Maintained on Pradaxa for CVA prophylaxis.  Admission chemistries unremarkable aside glucose 153 BUN 36 creatinine 1.39 hemoglobin 10.5 valproic acid 41.  Maintain on a dysphagia #1 nectar thick liquid diet.  Therapy evaluations completed due to patient decreased functional mobility was admitted for a  comprehensive rehab program.   Hospital Course: REYMOND MAYNEZ was admitted to rehab 05/26/2020 for inpatient therapies to consist of PT, ST and OT at least three hours five days a week. Past admission physiatrist, therapy team and rehab RN have worked together to provide customized collaborative inpatient rehab.  Pertaining to patient's left MCA distribution infarction remained stable maintained on Pradaxa and followed by neurology services.  Chronic low back pain hydrocodone as advised.  Dysphagia #1 nectar thick liquid diet follow-up speech therapy advance as tolerated IV fluids for supplementation initially made n.p.o he refused swallow study all risks were discussed with patient and family for risk of possible aspiration and his diet was advanced as tolerated.  Atrial fibrillation cardiac rate controlled on diltiazem as well as Pradaxa.  No bleeding episodes.  CKD stage III baseline creatinine 1.39-1.60.  Patient did have history of seizure disorder continued on Depakote no seizure activity.  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  Hypothyroidism with hormone supplement as directed.  Blood pressure overall controlled somewhat soft on Cardura and Lopressor.  Lipitor for hyperlipidemia.  Diastolic congestive heart failure with Lasix low dose monitoring for any signs of fluid overload latest chest x-ray did show left lower lobe airspace opacity compatible with pneumonia maintain on antibiotic therapy.  History of rheumatoid arthritis chronic prednisone as advised followed by rheumatology services.  Patient with slow progress failure to thrive Long discussions held with patient and wife and son  in regards to patient's slow progress multiple medical comorbidities it was later discussed the need for palliative care and plan for hospice care   Blood pressures were monitored on TID basis and soft and  monitored     Rehab course: During patient's stay in rehab weekly team conferences were held to monitor patient's progress, set goals and discuss barriers to discharge. At admission, patient required +2 physical assist for rolling total assist ADLs  Physical exam.  Blood pressure 122/66 pulse 84 temperature 97.7 respirations 16 oxygen saturation 97% room air Constitutional.  Frail elderly appearing male HEENT Head.  Right lid lag right facial droop tongue coated midline Eyes.  Reactive to light with left gaze preference Neck.  Supple nontender no JVD without thyromegaly Cardiac rhythm irregular and rate controlled  Abdomen.  Soft nontender positive bowel sounds Respiratory decreased breath sounds at the bases Musculoskeletal normal range of motion Comments.  Right upper extremity 3/5 proximal muscles 3+/5 distally Left upper extremity 4+/5 in same muscles tested Right lower extremity 3/5 in hip flexors knee extension knee flexion.  Patient has right BKA Left flexion 4+/5 in left lower extremity hip flexion knee extension dorsi and plantar flexion Skin.  Right BKA dressed Neurologic.  Alert made eye contact with examiner expressive receptive aphasia as well as some dysarthria.  He had difficulty naming some objects follows simple commands.    He/She  has had improvement in activity tolerance, balance, postural control as well as ability to compensate for deficits. He/She has had improvement in functional use RUE/LUE  and RLE/LLE as well as improvement in awareness.  Max assist grooming dependent for ADLs.  +2 assist for dependent pericare he was able to roll in bed required minimal assist and cues.  Required plus to assist for scooting up to head of bed.  Plan was for family teaching to be completed and transition to [facility] however no bed was available patient with continued deterioration thus he was transferred to acute care services 06/03/2020.       Disposition: Discharged to  acute care services    Diet: As tolerated  Special Instructions: All medication adjustments would be addressed as per home hospice care versus acute care services  30-35 minutes were spent completing discharge summary and discharge planning  Discharge Instructions    Ambulatory referral to Neurology   Complete by: As directed    An appointment is requested in approximately 4 weeks left MCA infarction       Follow-up Information    Raulkar, Clide Deutscher, MD Follow up.   Specialty: Physical Medicine and Rehabilitation Why: No follow up needed Contact information: [address] [place] [address] [phone]        Sanda Klein, MD Follow up.   Specialty: Cardiology Why: Call for appointment Contact information: [address] [place] [address] [phone]        Newt Minion, MD Follow up.   Specialty: Orthopedic Surgery Why: As needed Contact information: [place] [address] [phone]               Signed: Cathlyn Parsons 06/02/2020, 8:55 AM

## 2020-06-01 ENCOUNTER — Inpatient Hospital Stay (HOSPITAL_COMMUNITY): Payer: Medicare Other

## 2020-06-01 MED ORDER — GLYCOPYRROLATE 1 MG PO TABS
1.0000 mg | ORAL_TABLET | ORAL | Status: DC | PRN
  Administered 2020-06-01: 1 mg via ORAL
  Filled 2020-06-01 (×3): qty 1

## 2020-06-01 MED ORDER — HYDROCODONE-ACETAMINOPHEN 10-325 MG PO TABS: 1.0000 | ORAL_TABLET | ORAL | Status: DC

## 2020-06-01 MED ORDER — ONDANSETRON HCL 4 MG/2ML IJ SOLN: 4.0000 mg | Freq: Four times a day (QID) | INTRAMUSCULAR | Status: DC | PRN

## 2020-06-01 MED ORDER — LORAZEPAM 2 MG/ML IJ SOLN: 1.0000 mg | INTRAMUSCULAR | Status: DC | PRN

## 2020-06-01 MED ORDER — MORPHINE SULFATE (CONCENTRATE) 10 MG/0.5ML PO SOLN: 5.0000 mg | ORAL | Status: DC | PRN

## 2020-06-01 MED ORDER — HALOPERIDOL 1 MG PO TABS: 0.5000 mg | ORAL_TABLET | ORAL | Status: DC | PRN

## 2020-06-01 MED ORDER — HALOPERIDOL LACTATE 5 MG/ML IJ SOLN: 0.5000 mg | INTRAMUSCULAR | Status: DC | PRN

## 2020-06-01 MED ORDER — LORAZEPAM 2 MG/ML PO CONC: 1.0000 mg | ORAL | Status: DC | PRN

## 2020-06-01 MED ORDER — HALOPERIDOL LACTATE 2 MG/ML PO CONC
0.5000 mg | ORAL | Status: DC | PRN
  Filled 2020-06-01: qty 0.3

## 2020-06-01 MED ORDER — POLYVINYL ALCOHOL 1.4 % OP SOLN
1.0000 [drp] | Freq: Four times a day (QID) | OPHTHALMIC | Status: DC | PRN
  Filled 2020-06-01: qty 15

## 2020-06-01 MED ORDER — HYDROCODONE-ACETAMINOPHEN 10-325 MG PO TABS
2.0000 | ORAL_TABLET | ORAL | Status: DC
  Administered 2020-06-01: 2 via ORAL
  Filled 2020-06-01 (×4): qty 2

## 2020-06-01 MED ORDER — BIOTENE DRY MOUTH MT LIQD: 15.0000 mL | OROMUCOSAL | Status: DC | PRN

## 2020-06-01 MED ORDER — HYDROCODONE-ACETAMINOPHEN 10-325 MG PO TABS
2.0000 | ORAL_TABLET | ORAL | Status: AC
Start: 2020-06-01 — End: 2020-06-01
  Administered 2020-06-01: 2 via ORAL

## 2020-06-01 MED ORDER — GLYCOPYRROLATE 0.2 MG/ML IJ SOLN
0.2000 mg | INTRAMUSCULAR | Status: DC | PRN
  Filled 2020-06-01: qty 1

## 2020-06-01 MED ORDER — ONDANSETRON 4 MG PO TBDP: 4.0000 mg | ORAL_TABLET | Freq: Four times a day (QID) | ORAL | Status: DC | PRN

## 2020-06-01 MED ORDER — MORPHINE SULFATE (CONCENTRATE) 10 MG/0.5ML PO SOLN
5.0000 mg | ORAL | Status: DC | PRN
Start: 2020-06-01 — End: 2020-06-04
  Filled 2020-06-01: qty 0.5

## 2020-06-01 MED ORDER — LORAZEPAM 0.5 MG PO TABS: 1.0000 mg | ORAL_TABLET | ORAL | Status: DC | PRN

## 2020-06-01 NOTE — Progress Notes (Addendum)
PROGRESS NOTE   Subjective/Complaints: Appreciate PC involvement- current plan for hospice at [facility] Patient is alert and refused MBS and therapy today Plan for comfort measures Regular diet has been ordered.    ROS: +severe chronic lower back pain, +fatigue  Objective:   No results found. Recent Labs    05/30/20 0543 05/30/20 1000  WBC 8.1 9.3  HGB 10.3* 9.4*  HCT 32.3* 29.3*  PLT 122* 150   Recent Labs    05/30/20 0543 05/30/20 1000  NA 144 144  K 3.3* 3.4*  CL 112* 110  CO2 22 22  GLUCOSE 82 75  BUN 26* 25*  CREATININE 1.32* 1.41*  CALCIUM 7.8* 7.8*    Intake/Output Summary (Last 24 hours) at 06/01/2020 1117 Last data filed at 06/01/2020 0730 Gross per 24 hour  Intake 47.46 ml  Output --  Net 47.46 ml        Physical Exam: Vital Signs Blood pressure (!) 141/78, pulse 96, temperature 98 F (36.7 C), resp. rate 16, height 5\' 9"  (1.753 m), weight 61.7 kg, SpO2 99 %. Gen: no distress, normal appearing HEENT: oral mucosa pink and moist, NCAT Cardio: Reg rate Chest: normal effort, normal rate of breathing Abd: soft, non-distended Ext: no edema Psych: pleasant, normal affect Skin: intact Musculoskeletal: LB tender with palpation. ROM. Did not seem to have much tenderness in right BK limb. Limb well shaped, shrinker sock    Cervical back: Normal range of motion and neck supple.     Comments: RUE- 3/5 in proximal muscles- 3+/5 distally--inconcistent LUE- 4+/5 inconsistent effort RLE- 3/5 in HF/KE/KF- has R BKA LLE- 4 to 4+/5 in LLE- in HF/KE/DF and PF  Neurological:     Comments: Patient is lethargic this morning.  Makes eye contact with examiner.  expressive language deficits. dsyarthria.  follows simple commands. Sensation Intact to light touch in UEs and LLE/R BKA   Assessment/Plan: 1. Functional deficits which require 3+ hours per day of interdisciplinary therapy in a comprehensive  inpatient rehab setting.  Physiatrist is providing close team supervision and 24 hour management of active medical problems listed below.  Physiatrist and rehab team continue to assess barriers to discharge/monitor patient progress toward functional and medical goals  Care Tool:  Bathing        Body parts bathed by helper: Right arm,Left arm,Abdomen,Chest     Bathing assist Assist Level: Total Assistance - Patient < 25% (pt refusing to engage in LB bathing/dressing today)     Upper Body Dressing/Undressing Upper body dressing   What is the patient wearing?: Hospital gown only    Upper body assist Assist Level: Dependent - Patient 0%    Lower Body Dressing/Undressing Lower body dressing      What is the patient wearing?: Incontinence brief     Lower body assist Assist for lower body dressing: Dependent - Patient 0%     Toileting Toileting    Toileting assist Assist for toileting: Dependent - Patient 0%     Transfers Chair/bed transfer  Transfers assist     Chair/bed transfer assist level: 2 Helpers     Locomotion Ambulation   Ambulation assist   Ambulation activity did  not occur: Safety/medical concerns          Walk 10 feet activity   Assist  Walk 10 feet activity did not occur: Safety/medical concerns (fatigue, generalized weakness, decreased balance)        Walk 50 feet activity   Assist Walk 50 feet with 2 turns activity did not occur: Safety/medical concerns (fatigue, generalized weakness, decreased balance)         Walk 150 feet activity   Assist Walk 150 feet activity did not occur: Safety/medical concerns (fatigue, generalized weakness, decreased balance)         Walk 10 feet on uneven surface  activity   Assist Walk 10 feet on uneven surfaces activity did not occur: Safety/medical concerns (fatigue, generalized weakness, decreased balance)         Wheelchair     Assist Will patient use wheelchair at  discharge?: Yes Type of Wheelchair: Manual    Wheelchair assist level: Supervision/Verbal cueing Max wheelchair distance: 35ft    Wheelchair 50 feet with 2 turns activity    Assist        Assist Level: Total Assistance - Patient < 25%   Wheelchair 150 feet activity     Assist      Assist Level: Total Assistance - Patient < 25%   Blood pressure (!) 141/78, pulse 96, temperature 98 F (36.7 C), resp. rate 16, height 5\' 9"  (1.753 m), weight 61.7 kg, SpO2 99 %.    Medical Problem List and Plan: 1.  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   Comfort care 2.  Impaired mobility -DVT/anticoagulation: Continue Pradaxa-discussed cost with wife and they will be able to afford.              -antiplatelet therapy: N/A 3. Severe chronic low back pain  IV Tylenol ordered, may give oral hydrocodone- wife and patient willing to accept aspiration risk.   -continue massage right stump  -have scheduled tylenol 500mg  tid  -kpad for back pain, add muscle rub also 4. Mood: Provide emotional support             -antipsychotic agents: N/A 5. Neuropsych: This patient is not capable of making decisions on his own behalf. 6. Skin/Wound Care: Routine skin checks. Air mattress ordered, Gerhardt's butt cream ordered for MASD.  7. Fluids/Electrolytes/Nutrition: see #8 8.  Dysphagia 1.  Dysphagia #1 nectar thick liquid diet follow-up speech therapy  -Refused MBS, comfort feeds 9.  Atrial fibrillation.  Diltiazem 240 mg daily.  Cardiac rate controlled 10.  CAD with stenting.  Continue Pradaxa.  No chest pain or shortness of breath 11.  CKD stage III.  Baseline creatinine 1.39-1.60.  Follow-up chemistries 12.   Seizure disorder.  Depakote 500 mg twice daily 13.  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  Continue shrinker.  -wound looks fine 14.  Hypothyroidism.  Synthroid 15.  Hypertension.  Cardura 4 mg nightly.  Continue Lopressor 25 mg twice daily  3/19 controlled 16  GERD.  Protonix 17.  Hyperlipidemia.  Lipitor 18.  Diastolic congestive heart failure.  Lasix 20 mg daily.  Monitor for any  signs of fluid overload   Filed Weights   05/26/20 1500 05/30/20 0500  Weight: 65.2 kg 61.7 kg    3/21 -weight down signficantly.   -CXR ordered: results discussed with patient and wife: shows pneumonia  -hold lasix d/t sporadic intake 19.  Rheumatoid arthritis.  Continue chronic prednisone 20. L AC fossa IV d/c'd- active bleeding x10+ minutes- pressure applied to area yesterday. No active bleeding today 21. Anemia: Hgb 10.2-->9.7: check labs Monday 22. Pneumonia: Continue IV Zosyn. WBC normal today. Repeat tomorrow.  23. Disposition: Palliative care consulted and will see patient today. Wife would like son and patient to be part of the conversation as well. Plan for hospice- referred to [facility].  >35 minutes spent in discussion of hospice, PEG, prognosis with patient, his wife, Palliative Care team, refusal of MBS, caregiver training, referral to [facility] place, and in answering all questions  LOS: 6 days A FACE TO [place] 06/01/2020, 11:17 AM

## 2020-06-01 NOTE — Progress Notes (Signed)
Daily Progress Note   Patient Name: [name]       Date: 06/01/2020 DOB: [birth year]  Age: 87 y.o. MRN#: [id number] Attending Physician: Izora Ribas, MD Primary Care Physician: [name], MD Admit Date: 05/26/2020  Reason for Consultation/Follow-up: Establishing goals of care  Subjective: Met with [name]. Patient was more awake and alert today. He declined barium swallow.  Patient readily states he doesn't want to "see anyone else about anything". He feels he will die within 4-5 days and he just wants to be out of pain, allowed to eat and drink, kept clean, warm, and comfortable.  Options for Hospice at home vs inpatient hospice were discussed. Patient likely to need significant symptom management due to his ongoing aspiration issues. Patient and family elect to proceed with inpatient hospice- the prefer [facility].  At close of our discussion patient becoming more tired.   Review of Systems  Constitutional: Positive for malaise/fatigue and weight loss.  Respiratory: Positive for cough and sputum production.     Length of Stay: 6  Current Medications: Scheduled Meds:  . diltiazem  240 mg Oral Daily  . doxazosin  4 mg Oral QHS  . Gerhardt's butt cream   Topical BID  . HYDROcodone-acetaminophen  1 tablet Oral Q4H  . levothyroxine  150 mcg Oral QAC breakfast  . lidocaine  3 patch Transdermal Q24H  . metoprolol tartrate  25 mg Oral BID  . pantoprazole  40 mg Oral QHS  . traZODone  50 mg Oral QHS    Continuous Infusions: . acetaminophen 1,000 mg (06/01/20 0550)  . valproate sodium 500 mg (06/01/20 0747)    PRN Meds: antiseptic oral rinse, glycopyrrolate **OR** glycopyrrolate **OR** glycopyrrolate, haloperidol **OR** haloperidol **OR** haloperidol  lactate, lip balm, LORazepam **OR** LORazepam **OR** LORazepam, morphine CONCENTRATE **OR** morphine CONCENTRATE, Muscle Rub, ondansetron **OR** ondansetron (ZOFRAN) IV, oxymetazoline, polyvinyl alcohol, Resource ThickenUp Clear  Physical Exam Nursing note reviewed. Exam conducted with a chaperone present.  Constitutional:      General: He is not in acute distress.    Comments: Awake, alert, but keeps eyes closed most of the time  Pulmonary:     Comments: Wet cough, sputum present Skin:    Coloration: Skin is pale.  Neurological:     Mental Status: Mental status is at baseline.  Motor: Weakness present.             Vital Signs: BP (!) 141/78   Pulse 96   Temp 98 F (36.7 C)   Resp 16   Ht _0  (1.753 m)   Wt 61.7 kg   SpO2 99%   BMI 20.09 kg/m  SpO2: SpO2: 99 % O2 Device: O2 Device: Room Air O2 Flow Rate:    Intake/output summary:   Intake/Output Summary (Last 24 hours) at 06/01/2020 1140 Last data filed at 06/01/2020 0730 Gross per 24 hour  Intake 47.46 ml  Output --  Net 47.46 ml   LBM: Last BM Date: 06/01/20 Baseline Weight: Weight: 65.2 kg Most recent weight: Weight: 61.7 kg       Palliative Assessment/Data: PPS: 10%      Patient Active Problem List   Diagnosis Date Noted  . Oropharyngeal dysphagia   . Lethargy   . Acute right hemiparesis ([place]) 05/26/2020  . Stroke (cerebrum) ([place]) 05/23/2020  . Acute blood loss anemia   . Atypical chest pain   . Chronic duodenal ulcer with hemorrhage   . Gastrointestinal hemorrhage 01/07/2020  . Decreased hemoglobin   . Permanent atrial fibrillation ([place]) 10/24/2019  . Hypercholesterolemia 10/24/2019  . Current chronic use of systemic steroids 10/24/2019  . Macrocytic anemia 10/24/2019  . Dehiscence of amputation stump ([place])   . Wound infection 05/09/2018  . AKI (acute kidney injury) ([place])   . S/P BKA (below knee amputation) unilateral, right ([place])   . Below-knee amputation with complication, initial  encounter ([place])   . Tachypnea   . Post-operative pain   . Subacute osteomyelitis, right ankle and foot ([place])   . Paralysis of right lower extremity ([place])   . TIA (transient ischemic attack) 03/24/2018  . Encephalopathy 03/24/2018  . Cellulitis 03/15/2018  . Hypernatremia 03/15/2018  . Persistent atrial fibrillation ([place]) 11/10/2017  . Severe muscle deconditioning 11/10/2017  . Hemorrhage 10/22/2017  . Coronary artery disease involving native coronary artery of native heart without angina pectoris 10/22/2017  . Acute hypoxemic respiratory failure ([place]) 07/17/2017  . Aspiration syndrome, subsequent encounter 07/11/2017  . Aspiration pneumonia ([place]) 07/01/2017  . Acute encephalopathy 07/01/2017  . Thrombocytopenia ([place]) 07/01/2017  . Chronic diastolic (congestive) heart failure ([place]) 07/01/2017  . Anemia of chronic disease 07/01/2017  . Elevated troponin 07/01/2017  . Chronic atrial fibrillation 07/01/2017  . Goals of care, counseling/discussion   . Palliative care by specialist   . Acute metabolic encephalopathy 39/53/2023  . Stage 3a chronic kidney disease   . CAP (community acquired pneumonia) 04/17/2017  . Hallux rigidus, right foot 03/10/2016  . Lumbosacral spondylosis without myelopathy 10/29/2015  . Memory difficulty 09/22/2015  . Abnormality of gait 09/22/2015  . Hyperglycemia 07/06/2015  . Chronic pain 07/06/2015  . Chronic insomnia 03/30/2015  . Transient alteration of awareness 03/30/2015  . Abnormal liver function   . Altered mental status 01/31/2015  . Essential hypertension 01/31/2015  . Constipation 01/31/2015  . Hypothyroidism 01/31/2015  . Seizure disorder ([place]) 01/31/2015  . Bladder outlet obstruction 01/31/2015  . GERD (gastroesophageal reflux disease) 01/31/2015  . Chronic back pain 01/31/2015  . Acute kidney injury ([place]) 01/31/2015    Palliative Care Assessment & Plan   Patient Profile: 87 y.o. male  with past medical history of a fib, CADs/p  stenting and amputation, diastolic CHF, CKD III, HTN, rheumatoid arthritis, hyperlipidemia, seizure disorder, cognitive deficits initially admitted on 05/23/2020 with R side weakness and L gaze preference. Received TPA- MRI  indicated acute ischemia. Transferred to inpatient rehab on 05/28/2020. Over the the following weekend had extension of his stroke, resulting in worsening mental status and dysphagia. Developed aspiration pnuemonia. Palliative medicine consulted for [name] and "hospice discussion".      Assessment/Recommendations/Plan  Transition care to comfort measures only Schedule his current hydrocodone/apap per his request- although I have concerns about his ability to swallow- placed order for concentrated morphine as well- may benefit from fentanyl patch if unable to swallow scheduled pain med Other comfort meds as ordered Liberalize diet to allow for comfort bites and sips   Goals of Care and Additional Recommendations: Limitations on Scope of Treatment: Full Comfort Care  Code Status: DNR  Prognosis:  < 2 weeks due to aspiration pneumonia with plan for full comfort measures only  Discharge Planning: Hospice facility pending acceptance and bed availability  Care plan was discussed with patient and family.  Thank you for allowing the Palliative Medicine Team to assist in the care of this patient.   Total time: 78 minutes   Greater than 50%  of this time was spent counseling and coordinating care related to the above assessment and plan.  Mariana Kaufman, AGNP-C Palliative Medicine   Please contact Palliative Medicine Team phone at [phone] for questions and concerns.

## 2020-06-01 NOTE — Progress Notes (Signed)
Occupational Therapy Session Note  Patient Details  Name: [name] MRN: [id number] Date of Birth: March 24, 1933  Today's Date: 06/01/2020 OT Individual Time: 1130-1154 OT Individual Time Calculation (min): 24 min    Short Term Goals: Week 1:  OT Short Term Goal 1 (Week 1): Pt will be able to engage in UB bathing seated EOB with min assist for sitting balance OT Short Term Goal 2 (Week 1): Pt will engage in UB dressing with mod assist OT Short Term Goal 3 (Week 1): Pt will roll R and L in bed with mod assist to allow caregiver to complete LB dressing OT Short Term Goal 4 (Week 1): Pt will complete slide board transfer with max assist of 1 caregiver  Skilled Therapeutic Interventions/Progress Updates:    Treatment session with focus on caregiver education with pt, pt's wife, and pt's son.  Pt and family having just completed discussion with palliative care NP.  Pt's wife reporting plan to pursue hospice placement but wanting to still make pt comfortable and meet his needs.  Pt's son expressing desire for gentle ROM for functional tasks.  Therapist provided family with HEP with functional reach as needed for wiping face, reaching for cup, reaching for family member's hand, and to assist with bed mobility for comfort.  Pt's family appreciative of information and support.  Pt reports desire for no further therapy.  Therapy Documentation Precautions:  Precautions Precautions: Fall Precaution Comments: very thin skin, skin tears easily with multiple sites of skin breakdown; R neglect Restrictions Weight Bearing Restrictions: No Pain:  Pt with intermittent moans in pain when attempting to adjust in bed.   Therapy/Group: Individual Therapy  Simonne Come 06/01/2020, 12:19 PM

## 2020-06-01 NOTE — Progress Notes (Signed)
Physical Therapy Session Note  Patient Details  Name: [name] MRN: [id number] Date of Birth: Oct 05, 1933  Today's Date: 06/01/2020 PT Individual Time: 0800-0840 PT Individual Time Calculation (min): 40 min   Today's Date: 06/01/2020 PT Missed Time: 20 Minutes Missed Time Reason: Patient fatigue  Short Term Goals: Week 1:  PT Short Term Goal 1 (Week 1): pt will roll L and R with min A PT Short Term Goal 2 (Week 1): pt will transfer bed<>WC with LRAD and mod A of 1 PT Short Term Goal 3 (Week 1): pt will perform WC mobility 26ft with supervision  Skilled Therapeutic Interventions/Progress Updates:   Received pt supine in bed with wife present at bedside. Pt awake but declined getting OOB due to fatigue. Per RN, pt and wife did not sleep much last night and informed therapist of additional palliative care consult at 10:30am as pt's wife has additional questions. Pt's wife appeared overwhelmed that she will not be able to provide the necessary care for pt if he returns home now and has numerous concerns regarding pt's current medical status and discharge plan. She reported feeling confused after palliative care meeting yesterday with questions regarding if she will have around the clock nursing care to manage lines/leads/tubes; encouraged her to bring up these concerns at meeting later this morning. Discussed equipment at home with wife reporting pt has WC and hospital bed. Discussed need to perform hand on family education with [facility] lift however pt declined practicing this morning due to fatigue but agreed to attempt tomorrow. Pt's wife also very concerned with pt not eating and lack of energy to participate in therapy. Informed wife that pt has MBS this morning and SLP can further discuss details of swallow study to determine next course of action for diet. MD arrived for morning rounds and therapist updated MD on pt's wife's concerns ultimately stating that she is not ready for pt to discharge  home tomorrow. Donned L non-skid sock with total A and assisted pt in repositioning in bed for comfort and pressure relief. Concluded session with pt reclined in bed, needs within reach, and MD and wife present at bedside. 20 minutes missed of skilled physical therapy due to fatigue.   Therapy Documentation Precautions:  Precautions Precautions: Fall Precaution Comments: very thin skin, skin tears easily with multiple sites of skin breakdown; R neglect Restrictions Weight Bearing Restrictions: No  Therapy/Group: Individual Therapy Alfonse Alpers PT, DPT   06/01/2020, 7:17 AM

## 2020-06-01 NOTE — Progress Notes (Signed)
Patient ID: [name], male   DOB: 11-19-1933, 87 y.o.   MRN: [id number] Met with pt and wife to see how Palliative care conversation went. Wife thought Hospice would provide 24/7 care and pt could continue to have IV's at home. Informed her IV's were considered aggressive care and hospice does not provide 24/7 care, still need primary caregiver. Discussed Hospice Home and there he would receive 24/7 care. Wife is meeting with palliative care-Casey again today at 10:30. Pt has a MBS at 2:30. Discussed having pt's three boys come in to see Dad and discuss options along with her with palliative care. One of pt's son's was here yesterday to meet with Palliative care with wife. Also to ask pt what he would want, since he is listening and can still express his wishes. Follow up with wife after palliative care meeting today.

## 2020-06-01 NOTE — Progress Notes (Addendum)
Patient ID: [name], male   DOB: Sep 03, 1933, 87 y.o.   MRN: [id number] Met with wife and pt's son to inform spoke with Kasie-palliative care and was informed regarding their wishes-to pursue [facility]. Have contacted through [facility] liaison to inform of their wishes and am awaiting return call. Wife and son aware liaison will be touching base with them.  1:30 PM Have left another message for [facility] liaison-maryann covering phone today.   2:30 PM Spoke with [name]-[facility] liaison who reports there are no beds at [facility] today. Either she or Crystal will contact wife and answer questions later today. Have informed wife and son of this and hope will have a bed at [facility]. Touch base with Liaison in the morning.

## 2020-06-01 NOTE — Progress Notes (Signed)
Occupational Therapy Discharge Summary  Patient Details  Name: [name] MRN: [id number] Date of Birth: 12/03/1933   Patient has met 0 of 6 long term goals due to change in focus of goals to comfort care with transition to hospice.  Pt with ongoing medical issues impacting participation and progress as well as safe swallow - therefore now NPO.  Pt, wife, and pt's adult son met with palliative care NP to discuss EOL and GOC with plan to transition to [facility] for inpatient hospice care services.  Reasons goals not met: Pt and family have met with palliative care NP and have transitioned to hospice care with planned d/c to [facility].  Goals as set are no longer appropriate, with focus on comfort care at this time.  Recommendation:  Patient will benefit from ongoing skilled OT services in [facility] to continue to advance functional skills in the area of Reduce care partner burden and comfort care.  Equipment: No equipment provided  Reasons for discharge: discharge from hospital  Patient/family agrees with progress made and goals achieved: Yes  OT Discharge Precautions/Restrictions  Precautions Precautions: Fall Precaution Comments: R neglect Restrictions Weight Bearing Restrictions: No General   Vital Signs Therapy Vitals Temp:  (comfort care, no vital signs) Pain Pain Assessment Pain Scale: Faces Pain Score: 0-No pain Faces Pain Scale: Hurts a little bit ADL ADL Eating: NPO (ice chips) Where Assessed-Eating: Bed level Grooming: Dependent Where Assessed-Grooming: Bed level Upper Body Bathing: Dependent Where Assessed-Upper Body Bathing: Bed level Lower Body Bathing: Dependent Where Assessed-Lower Body Bathing: Bed level Upper Body Dressing: Not assessed Lower Body Dressing: Not assessed Toileting: Dependent ADL Comments: Not assessed as pt now comfort care only and pt preferring to remain in hospital gown Vision Baseline Vision/History: Wears  glasses Wears Glasses: At all times Patient Visual Report: No change from baseline Vision Assessment?: Vision impaired- to be further tested in functional context;Yes Ocular Range of Motion: Restricted on the right Alignment/Gaze Preference: Gaze left Additional Comments: no formal visual assessment completed upon d/c as pt and family wishing for comfort care and transition to hospice Perception  Perception: Impaired Praxis Praxis: Impaired Praxis Impairment Details: Motor planning Cognition Overall Cognitive Status: Impaired/Different from baseline Arousal/Alertness: Lethargic Orientation Level: Oriented to person Attention: Focused Focused Attention: Impaired Focused Attention Impairment: Verbal basic;Functional basic Memory: Impaired Memory Impairment: Storage deficit;Retrieval deficit;Decreased recall of new information Awareness: Impaired Awareness Impairment: Emergent impairment Problem Solving: Impaired Safety/Judgment: Impaired Sensation Sensation Light Touch: Not tested Proprioception: Impaired by gross assessment Additional Comments: not formally assessed upon d/c as pt and family focus on comfort care and transition to hospice Coordination Gross Motor Movements are Fluid and Coordinated: No Fine Motor Movements are Fluid and Coordinated: No Coordination and Movement Description: grossly uncoordinated due to fatigue, generalized weakness, R BKA, R hemiparesis, decreased balance/postural control, and decreased endurance.  Has not gotten OOB in ~3 days due to progressive weakness Motor  Motor Motor: Hemiplegia Mobility     Trunk/Postural Assessment  Cervical Assessment Cervical Assessment: Exceptions to WFL (forward head) Thoracic Assessment Thoracic Assessment: Exceptions to WFL (kyphosis) Lumbar Assessment Lumbar Assessment: Exceptions to WFL (posterior pelvic tilt) Postural Control Postural Control: Deficits on evaluation  Balance Balance Balance Assessed:  No Extremity/Trunk Assessment RUE Assessment RUE Assessment: Exceptions to WFL Active Range of Motion (AROM) Comments: not formally assessed upon d/c as pt and family focus on comfort care and transition to hospice.  Educated on gentle, purposeful PROM for maintained mobility to tolerance LUE Assessment   LUE Assessment: Exceptions to [facility] Active Range of Motion (AROM) Comments: not formally assessed upon d/c as pt and family focus on comfort care and transition to hospice.  Educated on gentle, purposeful PROM for maintained mobility to tolerance   Avannah Decker, [facility] 06/01/2020, 3:50 PM

## 2020-06-01 NOTE — Progress Notes (Addendum)
Manufacturing engineer [facility]) Hospital Liaison note.    Received request from [place] for family interest in [facility]. Spoke with pt's spouse [name] to initiate education about [employer] and to verify interest.  Plan made to visit tomorrow at bedside.  Chart and pt information under review by [facility] physician.  Hospice eligibility pending at this time.  [facility] is unable to offer a room today. Hospital Liaison will follow up tomorrow or sooner if a room becomes available. Please do not hesitate to call with questions.    Thank you for the opportunity to participate in this patients care.  Chrislyn Edison Pace, BSN, RN [place] (listed on [place] under Hospice/[facility])    [phone]

## 2020-06-01 NOTE — Progress Notes (Signed)
Physical Therapy Discharge Summary  Patient Details  Name: [name] MRN: [id number] Date of Birth: 1933-11-25  Patient has met 0 of 8 long term goals. Pt was unable to complete CIR program due to medical complications resulting in decline in functional status and pt's limited ability and desire to participate in therapy. Plan for comfort measures for remainder of pt's stay. Pt and family have met with palliative care NP to discuss options and have ultimately decided to transition to [facility] for inpatient hospice care services.   Reasons goals not met: Due to ongoing medical issues PT goals no longer appropriate as pt and family wish to focus on comfort measures only at this time.   Recommendation:  Pt will benefit from skilled PT services in [facility] with emphasis on functional mobility and strengthening to reduce caregiver burden and assist with pt's comfort.    Equipment: No equipment provided  Reasons for discharge: change in medical status, lack of progress toward goals and discharge from hospital  Patient/family agrees with progress made and goals achieved: Yes  PT Discharge Precautions/Restrictions Precautions Precautions: Fall Precaution Comments: R neglect Restrictions Weight Bearing Restrictions: No Cognition Overall Cognitive Status: Impaired/Different from baseline Arousal/Alertness: Lethargic Orientation Level: Oriented to person;Oriented to place;Disoriented to time Memory: Impaired Awareness: Impaired Problem Solving: Impaired Safety/Judgment: Impaired Sensation Light Tough: Not tested Proprioception: Impaired by gross assessment Additional Comments: not formally assessed upon d/c as pt and family focus on comfort care and transition to hospiceot formally assessed upon d/c as pt and family focus on comfort care and transition to hospice Coordination Gross Motor Movements are Fluid and Coordinated: No Coordination and Movement Description: grossly  uncoordinated due to fatigue, generalized weakness, R BKA, R hemiparesis, decreased balance/postural control, and decreased endurance. Has not gotten OOB in ~3 days due to progressive weakness Motor  Motor Motor: Hemiplegia  Mobility Bed Mobility Bed Mobility: Rolling Right;Rolling Left;Sit to Supine;Supine to Sit Rolling Right: Moderate Assistance - Patient 50-74% Rolling Left: Moderate Assistance - Patient 50-74% Supine to Sit: Maximal Assistance - Patient - Patient 25-49% Sit to Supine: Moderate Assistance - Patient 50-74% Transfers Transfers: Lateral/Scoot Transfers Sit to Stand: Moderate Assistance - Patient 50-74% (Slideboard)  Pt has not gotten OOB in ~3 days due to progressive weakness  Locomotion  Gait Ambulation: No (gait not appropriate due to declining function and medical status) Gait Gait: No Stairs / Additional Locomotion Stairs: No  Trunk/Postural Assessment  Cervical Assessment Cervical Assessment: Exceptions to [facility] (forward head) Thoracic Assessment Thoracic Assessment: Exceptions to [facility] (kyphosis) Lumbar Assessment Lumbar Assessment: Exceptions to [facility] (posterior pelvic tilt) Postural Control Postural Control: Deficits on evaluation  Balance Not formally assessed due to limited participation, decreased arousal, and decline in medical status.  Extremity Assessment  RLE Assessment RLE Assessment: Exceptions to [facility] General Strength Comments: not formally assessed upon d/c as pt and family focus on comfort care and transition to hospice. Educated pt/family on gentle AROM for maintained mobility to tolerance LLE Assessment LLE Assessment: Exceptions to [facility] General Strength Comments: not formally assessed upon d/c as pt and family focus on comfort care and transition to hospice. Educated pt/family on gentle AROM for maintained mobility to tolerance   Alfonse Alpers PT, DPT  06/01/2020, 12:24 PM

## 2020-06-01 NOTE — Progress Notes (Signed)
Pharmacy Antibiotic Note  [name] is a 87 y.o. male admitted to inpatient rehab on 05/26/2020.  Pt was started on Zosyn 05/29/20 with concerns for aspiration pneumonia.  Today is antibiotic day #4.  Patient remains NPO with plans for a MBS today.  Plan: Continue Zosyn 3.375g IV q8h (4 hour infusion).  Follow-up swallow eval for possible change to oral abx. Follow-up length of therapy.  Consider 5-7 days.  Height: 5\' 9"  (175.3 cm) Weight: 61.7 kg (136 lb 0.4 oz) IBW/kg (Calculated) : 70.7  Temp (24hrs), Avg:98.2 F (36.8 C), Min:98 F (36.7 C), Max:98.5 F (36.9 C)  Recent Labs  Lab 05/26/20 0432 05/29/20 1152 05/30/20 0543 05/30/20 1000  WBC 6.8 12.7* 8.1 9.3  CREATININE 1.15 1.45* 1.32* 1.41*    Estimated Creatinine Clearance: 34 mL/min (A) (by C-G formula based on SCr of 1.41 mg/dL (H)).    Allergies  Allergen Reactions  . Eliquis [Apixaban] Other (See Comments)    bleeding  . Demerol [Meperidine] Nausea And Vomiting  . Keppra [Levetiracetam] Other (See Comments)    Causes sleepiness, mental status changes  . Vimpat [Lacosamide] Other (See Comments)    Over sedated     Antimicrobials this admission: Zosyn 3/20 >>   Thank you for allowing pharmacy to be a part of this patient's care.  [employer], Pharm.D., BCPS Clinical Pharmacist Clinical phone for 06/01/2020 from 8:30-4:00 is [phone].  **Pharmacist phone directory can be found on [email or web address] listed under [place].  06/01/2020 9:19 AM

## 2020-06-01 NOTE — Progress Notes (Signed)
Speech Language Pathology Discharge Summary  Patient Details  Name: [name] MRN: [id number] Date of Birth: 10/13/1933  Today's Date: 06/01/2020 SLP Individual Time: 1157-2620 SLP Individual Time Calculation (min): 46 min   Skilled Therapeutic Interventions:  Skilled ST services focused on education. SLP attempt to administer instrumental swallow assessment, MBS, however due to pt refusal the procedure was not completed. SLP provided thorough education and rationale pertaining to importance of determining safest possible PO intake. Pt is currently NPO status. Pt's wife was present and assisted in educating/encouraging pt to participate. Pt ultimately decided to not participate in MBS.   The remainder of the session was spent providing education to pt's wife pertaining to swallowing prognosis and providing emotional support. Pt has a poor prognosis to consume PO intake safely due to unfavorable structural anatomy (kyphosis, spinal stenosis/degeneration, C2-C5 hardware), h/o recurring PNAs, deconditioning and acute CVA. In the set up of the assessment, due to kyphosis,  pt was reclined to 30 degrees to achieve a partially vertical alignment of pharyngeal structures. Pt appears to be likely aspirating on own secretions, with consistent wet vocal quality present. Pt is currently NPO and the placement of alternative means of nutrition (PEG) and remaining NPO does not eliminate potential for aspiration of reflux or secretions. All education and information was presented to pt's wife. Pt's wife expressed her next step is to consult with sons on how to support and assist in determining pt's medical wishes/plan. SLP recommends to continue comfort feeding at this time and pt's wife is in agreement.    Patient has met 7 of 7 long term goals.  Patient to discharge at [facility] Max;Total level.  Reasons goals not met: medical decline   Clinical Impression/Discharge Summary:   Pt met 0 out 7 goals due to  medical decline. SLP focused on cognitive, speech and swallow deficits due to acute CVA, however during CIR pt experienced medical decline and developed PNA. Pt refused MBS assessment and continues to demonstrate consistent wet vocal quality. See note above for more detailed information pertaining to dysphagia. SLP recommends comfort feeding at this time and family is discharging with hospice. No further ST services indicated.   Care Partner:  Caregiver Able to Provide Assistance: Other (comment) (Hospice)  Type of Caregiver Assistance: Physical;Cognitive  Recommendation:  24 hour supervision/assistance      Equipment: N/A   Reasons for discharge: Change in medical status;Discharged from hospital   Patient/Family Agrees with Progress Made and Goals Achieved: Yes    [place]  [facility] 06/01/2020, 2:11 PM

## 2020-06-02 DIAGNOSIS — R1310 Dysphagia, unspecified: Secondary | ICD-10-CM

## 2020-06-02 MED ORDER — LIDOCAINE 5 % EX PTCH: 3.0000 | MEDICATED_PATCH | CUTANEOUS | 0 refills | Status: AC

## 2020-06-02 MED ORDER — ORAL CARE MOUTH RINSE
15.0000 mL | Freq: Two times a day (BID) | OROMUCOSAL | Status: DC
  Administered 2020-06-02 – 2020-06-04 (×4): 15 mL via OROMUCOSAL

## 2020-06-02 MED ORDER — VALPROATE SODIUM 100 MG/ML IV SOLN: 500.0000 mg | Freq: Two times a day (BID) | INTRAVENOUS | Status: AC

## 2020-06-02 MED ORDER — MORPHINE SULFATE (CONCENTRATE) 10 MG/0.5ML PO SOLN
5.0000 mg | ORAL | 0 refills | Status: AC | PRN
Start: 2020-06-02 — End: ?

## 2020-06-02 MED ORDER — TRAZODONE HCL 50 MG PO TABS: 50.0000 mg | ORAL_TABLET | Freq: Every day | ORAL | Status: AC

## 2020-06-02 MED ORDER — HYDROCODONE-ACETAMINOPHEN 10-325 MG PO TABS: 2.0000 | ORAL_TABLET | ORAL | 0 refills | Status: AC

## 2020-06-02 MED ORDER — DILTIAZEM HCL ER COATED BEADS 240 MG PO CP24
240.0000 mg | ORAL_CAPSULE | Freq: Every day | ORAL | Status: AC
Start: 2020-06-02 — End: ?

## 2020-06-02 MED ORDER — DOXAZOSIN MESYLATE 4 MG PO TABS: 4.0000 mg | ORAL_TABLET | Freq: Every day | ORAL | Status: AC

## 2020-06-02 MED ORDER — HYDROCODONE-ACETAMINOPHEN 10-325 MG PO TABS
1.0000 | ORAL_TABLET | ORAL | Status: DC
  Administered 2020-06-03: 1 via ORAL
  Filled 2020-06-02 (×2): qty 1

## 2020-06-02 MED ORDER — MORPHINE SULFATE (PF) 2 MG/ML IV SOLN
2.0000 mg | INTRAVENOUS | Status: DC | PRN
  Administered 2020-06-02 – 2020-06-04 (×9): 2 mg via INTRAVENOUS
  Filled 2020-06-02 (×10): qty 1

## 2020-06-02 MED ORDER — METOPROLOL TARTRATE 25 MG PO TABS: 25.0000 mg | ORAL_TABLET | Freq: Two times a day (BID) | ORAL | Status: AC

## 2020-06-02 NOTE — Progress Notes (Signed)
Patient comfortable; no grimace noted when moved side to side ; patient sleeps most of the time but able to nod when family member talked to him this afternoon. Mouthcare done this shift.

## 2020-06-02 NOTE — Progress Notes (Signed)
PROGRESS NOTE   Subjective/Complaints:  Very sleepy right now Spoke with son at bedside. He is at peace with father's decision. Says his step-mother is very stressed right now. Advised that all brothers may visit. Medications have been adjusted by palliative team, appreciate their support to family.    ROS: +severe chronic lower back pain, +fatigue, limited due to fatigue  Objective:   No results found. No results for input(s): WBC, HGB, HCT, PLT in the last 72 hours. No results for input(s): NA, K, CL, CO2, GLUCOSE, BUN, CREATININE, CALCIUM in the last 72 hours.  Intake/Output Summary (Last 24 hours) at 06/02/2020 1327 Last data filed at 06/02/2020 0700 Gross per 24 hour  Intake 0 ml  Output -  Net 0 ml        Physical Exam: Vital Signs Blood pressure (!) 146/69, pulse 99, temperature 98.1 F (36.7 C), resp. rate 16, height 5\' 9"  (1.753 m), weight 61.7 kg, SpO2 97 %. Gen: no distress, normal appearing HEENT: oral mucosa pink and moist, NCAT Cardio: Reg rate Chest: normal effort, normal rate of breathing Abd: soft, non-distended Ext: no edema Psych: pleasant, normal affect Skin: intact Musculoskeletal: LB tender with palpation. ROM. Did not seem to have much tenderness in right BK limb. Limb well shaped, shrinker sock    Cervical back: Normal range of motion and neck supple.     Comments: RUE- 3/5 in proximal muscles- 3+/5 distally--inconcistent LUE- 4+/5 inconsistent effort RLE- 3/5 in HF/KE/KF- has R BKA LLE- 4 to 4+/5 in LLE- in HF/KE/DF and PF  Neurological:     Comments: Patient is lethargic this morning.  Makes eye contact with examiner.  expressive language deficits. dsyarthria.  follows simple commands. Sensation Intact to light touch in UEs and LLE/R BKA   Assessment/Plan: 1. Functional deficits which require 3+ hours per day of interdisciplinary therapy in a comprehensive inpatient rehab  setting.  Physiatrist is providing close team supervision and 24 hour management of active medical problems listed below.  Physiatrist and rehab team continue to assess barriers to discharge/monitor patient progress toward functional and medical goals  Care Tool:  Bathing        Body parts bathed by helper: Right arm,Left arm,Abdomen,Chest     Bathing assist Assist Level: Total Assistance - Patient < 25% (pt refusing to engage in LB bathing/dressing today)     Upper Body Dressing/Undressing Upper body dressing   What is the patient wearing?: Hospital gown only    Upper body assist Assist Level: Dependent - Patient 0%    Lower Body Dressing/Undressing Lower body dressing      What is the patient wearing?: Incontinence brief     Lower body assist Assist for lower body dressing: Dependent - Patient 0%     Toileting Toileting    Toileting assist Assist for toileting: Dependent - Patient 0%     Transfers Chair/bed transfer  Transfers assist     Chair/bed transfer assist level: Moderate Assistance - Patient 50 - 74% (slideboard)     Locomotion Ambulation   Ambulation assist   Ambulation activity did not occur: Safety/medical concerns (R hemi, R BKA, fatigue, weakness, decline in medical status)  Walk 10 feet activity   Assist  Walk 10 feet activity did not occur: Safety/medical concerns (R hemi, R BKA, fatigue, weakness, decline in medical status)        Walk 50 feet activity   Assist Walk 50 feet with 2 turns activity did not occur: Safety/medical concerns (R hemi, R BKA, fatigue, weakness, decline in medical status)         Walk 150 feet activity   Assist Walk 150 feet activity did not occur: Safety/medical concerns (R hemi, R BKA, fatigue, weakness, decline in medical status)         Walk 10 feet on uneven surface  activity   Assist Walk 10 feet on uneven surfaces activity did not occur: Safety/medical concerns (R hemi,  R BKA, fatigue, weakness, decline in medical status)         Wheelchair     Assist Will patient use wheelchair at discharge?:  (TBD) Type of Wheelchair: Manual Wheelchair activity did not occur:  (decline in medical status)  Wheelchair assist level: Supervision/Verbal cueing Max wheelchair distance: 87ft    Wheelchair 50 feet with 2 turns activity    Assist    Wheelchair 50 feet with 2 turns activity did not occur: Safety/medical concerns (decline in medical status)   Assist Level: Total Assistance - Patient < 25%   Wheelchair 150 feet activity     Assist  Wheelchair 150 feet activity did not occur: Safety/medical concerns (decline in medical status)   Assist Level: Total Assistance - Patient < 25%   Blood pressure (!) 146/69, pulse 99, temperature 98.1 F (36.7 C), resp. rate 16, height 5\' 9"  (1.753 m), weight 61.7 kg, SpO2 97 %.    Medical Problem List and Plan: 1.  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   Continue comfort care 2.  Impaired mobility -DVT/anticoagulation: Pradaxa discontinued give comfort care.              -antiplatelet therapy: N/A 3. Severe chronic low back pain  IV Morphine PRN (not used) and oral hydrocodone ordered.  4. Mood: Provide emotional support             -antipsychotic agents: N/A 5. Neuropsych: This patient is not capable of making decisions on his own behalf. 6. Skin/Wound Care: Routine skin checks. Air mattress ordered, Gerhardt's butt cream ordered for MASD.  7. Fluids/Electrolytes/Nutrition: see #8 8.  Dysphagia 1.  Dysphagia #1 nectar thick liquid diet follow-up speech therapy  -Refused MBS, comfort feeds 9.  Atrial fibrillation.   Diltiazem 240 mg daily.  Cardiac rate controlled 10.  CAD with stenting.  Continue Pradaxa.  No chest pain or shortness of breath 11.  CKD stage III.  Baseline creatinine 1.39-1.60.  Follow-up chemistries 12.  Seizure disorder.  Depakote 500 mg twice daily 13.  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  Continue shrinker.  -wound looks fine 14.  Hypothyroidism.  Synthroid 15.  Hypertension.  d/c Cardura 4 mg. Continue Lopressor 25 mg twice daily  3/19 controlled 16  GERD.  Continue Protonix 17.  Hyperlipidemia.  Lipitor 18.  Diastolic congestive heart failure.  d/c Lasix 20 mg daily.  19.  Rheumatoid arthritis.  d/c chronic prednisone 20. L AC fossa IV d/c'd 21. Anemia: Hgb 10.2-->9.7 22. Pneumonia: d/c IV Zosyn. WBC normal on repeat 23. Disposition: d/c to [facility] next available bed. Discussed with youngest son at bedside and he is in agreement with plan  LOS: 7 days A FACE TO FACE EVALUATION WAS [place] 06/02/2020, 1:27 PM

## 2020-06-02 NOTE — Progress Notes (Addendum)
Patient ID: [name], male   DOB: [birth year], 87 y.o.   MRN: [id number]  Spoke with [facility]-liaison who will check to see if will have a bed at [facility] for pt today. Will call back with information.  9:30 Am Spoke with Chrislyn-Hospital Liaison with [facility] who reports the Medical Director has to accept him and he is in a team meeting until 12:00. She plans to come and see pt and wife this afternoon. No beds currently at [facility]. Will not go today unless something changes. Will keep team updated if any changes.  11:00 AM Met with wife and son at the bedside who wife has errands to run so wants time liaison will come by to see them. Have contacted Chrislyn to ask apporx time. She reports Maryann the other liaison will be seeing them today. Maryann to call wife on her cell to inform of a time to see today. Pt less responsive today. Wife and son aware if more inform will let them know.

## 2020-06-02 NOTE — Progress Notes (Signed)
Manufacturing engineer [facility]) Hospital Liaison note.    Met with son, Rush Landmark at bedside to explain services and offer support.   [facility] is unable to offer a room today. Hospital Liaison will follow up tomorrow or sooner if a room becomes available and eligibility is confirmed.   A Please do not hesitate to call with questions.   Thank you,   Farrel Gordon, RN, [place] (listed on [facility] under [place])    [phone]

## 2020-06-02 NOTE — Progress Notes (Signed)
Unable to give due meds patient is not alert to take meds. Wife in room expressed concern re: aspiration pneumonia. Dan PA notified.

## 2020-06-02 NOTE — Plan of Care (Signed)
  Problem: RH BOWEL ELIMINATION Goal: RH STG MANAGE BOWEL WITH ASSISTANCE Description: STG Manage Bowel with Min Assistance. Outcome: Not Progressing; incontinence   Problem: RH BLADDER ELIMINATION Goal: RH STG MANAGE BLADDER WITH ASSISTANCE Description: STG Manage Bladder With min Assistance Outcome: Not Progressing; incontinence   Problem: RH PAIN MANAGEMENT Goal: RH STG PAIN MANAGED AT OR BELOW PT'S PAIN GOAL Description: < 4 on a 0-10 pain scale. Outcome: Not Progressing; sleeps most of the time

## 2020-06-02 NOTE — Progress Notes (Signed)
This chaplain responded to consult for spiritual care after reading the chart notes and consulting with Palliative Medicine NP-Kasie.  The Pt. is resting with no signs of distress with the Pt. Wife-[name] and RN-Angelina at the bedside.   The chaplain offers companionship and a reflective listening presence as [name] emotionally shares "the Pt. was given double his usual dose of hydrocodone and is not responsive."  [name] agrees on keeping the Pt. comfortable but raises question to why the Pt. is not responding.   The chaplain understands [name] is questioning the future role and care provided by Hospice. [name] recognizes in her story telling the positive quality of life the Pt. had a home with her care and "fears a caregiver will determine the Pt. EOL instead of God."  [name] accepted the chaplain's intercessory prayer and F/U spiritual care with an opportunity for the Pt. clergy to visit.

## 2020-06-02 NOTE — Progress Notes (Addendum)
Daily Progress Note   Patient Name: [name]       Date: 06/02/2020 DOB: 1934/02/17  Age: 87 y.o. MRN#: [id number] Attending Physician: Izora Ribas, MD Primary Care Physician: [name], MD Admit Date: 05/26/2020  Reason for Consultation/Follow-up: Establishing goals of care  Subjective: Patient in bed, resting comfortably. He wakes to my voice and nods in response to my questions. Denies pain or discomfort.  Met at bedside with patient's son, [name].  He relayed that [name] today is concerned as patient is less awake today. She worries about his hydrocodone dose.  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 We further discussed the dying process and that it is expected that [name] over time as he progresses in his dying journey.  She shares that he enjoyed drinking some milk last night.  [name] is also concerned about him continuing to aspirate on applesauce taking po medications.  We discussed alternative options for pain management including IV and sublingual morphine. She inquired about a fentanyl patch.  We again reviewed the Hospice philosophy and what comfort measures only means. Discussed how patient had expressed his desire to be allowed to proceed through dying process without further interventions that would prolong his suffering.    Review of Systems  Unable to perform ROS: Acuity of condition    Length of Stay: 7     Continuous Infusions: . valproate sodium 500 mg (06/02/20  1030)    PRN Meds: antiseptic oral rinse, glycopyrrolate **OR** glycopyrrolate **OR** glycopyrrolate, haloperidol **OR** haloperidol **OR** haloperidol lactate, lip balm, LORazepam **OR** [DISCONTINUED] LORazepam **OR** LORazepam, morphine injection, morphine CONCENTRATE **OR** morphine CONCENTRATE, Muscle Rub, ondansetron **OR** ondansetron (ZOFRAN) IV, oxymetazoline, polyvinyl alcohol, Resource ThickenUp Clear  Physical Exam Vitals and nursing note reviewed.  Constitutional:      Appearance: He is ill-appearing.  Neurological:     Comments: Lethargic, nods appropriately to questions             Vital Signs: BP (!) 146/69 (BP Location: Right Arm)   Pulse 99   Temp  98.1 F (36.7 C)   Resp 16   Ht $R'5\' 9"'tL$  (1.753 m)   Wt 61.7 kg   SpO2 97%   BMI 20.09 kg/m  SpO2: SpO2: 97 % O2 Device: O2 Device: Room Air O2 Flow Rate:    Intake/output summary:   Intake/Output Summary (Last 24 hours) at 06/02/2020 1216 Last data filed at 06/02/2020 0700 Gross per 24 hour  Intake 0 ml  Output -  Net 0 ml   LBM: Last BM Date: 06/01/20 Baseline Weight: Weight: 65.2 kg Most recent weight: Weight: 61.7 kg       Palliative Assessment/Data: PPS: 10%      Patient Active Problem List   Diagnosis Date Noted  . Oropharyngeal dysphagia   . Lethargy   . Acute right hemiparesis ([place]) 05/26/2020  . Stroke (cerebrum) ([place]) 05/23/2020  . Acute blood loss anemia   . Atypical chest pain   . Chronic duodenal ulcer with hemorrhage   . Gastrointestinal hemorrhage 01/07/2020  . Decreased hemoglobin   . Permanent atrial fibrillation ([place]) 10/24/2019  . Hypercholesterolemia 10/24/2019  . Current chronic use of systemic steroids 10/24/2019  . Macrocytic anemia 10/24/2019  . Dehiscence of amputation stump ([place])   . Wound infection 05/09/2018  . AKI (acute kidney injury) ([place])   . S/P BKA (below knee amputation) unilateral, right ([place])   . Below-knee amputation with complication, initial encounter  ([place])   . Tachypnea   . Post-operative pain   . Subacute osteomyelitis, right ankle and foot ([place])   . Paralysis of right lower extremity ([place])   . TIA (transient ischemic attack) 03/24/2018  . Encephalopathy 03/24/2018  . Cellulitis 03/15/2018  . Hypernatremia 03/15/2018  . Persistent atrial fibrillation ([place]) 11/10/2017  . Severe muscle deconditioning 11/10/2017  . Hemorrhage 10/22/2017  . Coronary artery disease involving native coronary artery of native heart without angina pectoris 10/22/2017  . Acute hypoxemic respiratory failure ([place]) 07/17/2017  . Aspiration syndrome, subsequent encounter 07/11/2017  . Aspiration pneumonia ([place]) 07/01/2017  . Acute encephalopathy 07/01/2017  . Thrombocytopenia ([place]) 07/01/2017  . Chronic diastolic (congestive) heart failure ([place]) 07/01/2017  . Anemia of chronic disease 07/01/2017  . Elevated troponin 07/01/2017  . Chronic atrial fibrillation 07/01/2017  . Goals of care, counseling/discussion   . Palliative care by specialist   . Acute metabolic encephalopathy 98/01/9146  . Stage 3a chronic kidney disease   . CAP (community acquired pneumonia) 04/17/2017  . Hallux rigidus, right foot 03/10/2016  . Lumbosacral spondylosis without myelopathy 10/29/2015  . Memory difficulty 09/22/2015  . Abnormality of gait 09/22/2015  . Hyperglycemia 07/06/2015  . Chronic pain 07/06/2015  . Chronic insomnia 03/30/2015  . Transient alteration of awareness 03/30/2015  . Abnormal liver function   . Altered mental status 01/31/2015  . Essential hypertension 01/31/2015  . Constipation 01/31/2015  . Hypothyroidism 01/31/2015  . Seizure disorder ([place]) 01/31/2015  . Bladder outlet obstruction 01/31/2015  . GERD (gastroesophageal reflux disease) 01/31/2015  . Chronic back pain 01/31/2015  . Acute kidney injury ([place]) 01/31/2015    Palliative Care Assessment & Plan   Patient Profile: 87 y.o.malewith past medical history of a fib, CADs/p stenting and  amputation, diastolic CHF, CKD III, HTN, rheumatoid arthritis, hyperlipidemia, seizure disorder, cognitive deficits initiallyadmitted on3/14/2022withR side weakness and L gaze preference.Received TPA- MRI indicated acute ischemia. Transferred to inpatient rehab on 05/28/2020. Over the the following weekend had extension of his stroke, resulting in worsening mental status and dysphagia. Developed aspiration pnuemonia. Palliative medicine consulted for [place] and "  hospice discussion".  Assessment/Recommendations/Plan   Change hydrocodone/apap 1-2 tabs po q4hr as needed  D/C most PO meds that are not affecting comfort  If pain appears not to be controlled will order fentanyl patch  Awaiting [facility]  Emotional support provided to spouse as she is having difficulty accepting patient's mortality and is also likely dealing with issues related to her role identity as she has been his primary caregiver for several years   Goals of Care and Additional Recommendations:  Limitations on Scope of Treatment: Full Comfort Care  Code Status:  DNR  Prognosis:   < 2 weeks  Discharge Planning:  Hospice facility  Care plan was discussed with patient's son and spouse  Thank you for allowing the Palliative Medicine Team to assist in the care of this patient.  Total time: 64 minutes  Greater than 50%  of this time was spent counseling and coordinating care related to the above assessment and plan.  Mariana Kaufman, AGNP-C Palliative Medicine   Please contact Palliative Medicine Team phone at [phone] for questions and concerns.

## 2020-06-03 DIAGNOSIS — Z66 Do not resuscitate: Secondary | ICD-10-CM

## 2020-06-03 DIAGNOSIS — Z515 Encounter for palliative care: Secondary | ICD-10-CM

## 2020-06-03 NOTE — Progress Notes (Signed)
VAST consulted to assess current IV site. Pt with bloody dressing over IV site in left posterior forearm. Undressed and cleaned site with chlorhexadine; allowed site to dry. Applied pressure at insertion site until bleeding stopped; SecurePort applied at insertion site and allowed to dry before new dressing applied.  Flushed IV with 3-4 21mL syringes of NS to assess patency; excellent blood return, flushed easily, no swelling or redness.  Due to compression of IV site, elbow sleeve was removed. Education provided to patient's wife and patient's nurse.

## 2020-06-03 NOTE — Progress Notes (Addendum)
Patient ID: [name], male   DOB: 13-Sep-1933, 87 y.o.   MRN: [id number] Spoke with Chrislyn-liaison for [place] who reports no beds at [facility] today. Will see if can go over the weekend if bed becomes available. MD and team aware of.  2;13 PM Will place PTAR packet in pt's soft chart and let RN know in case bed over the weekend at [facility].

## 2020-06-03 NOTE — Progress Notes (Signed)
Nutrition Brief Note  RD drawn to pt's chart due to positive MST score. Chart reviewed. Pt is full comfort care.  No nutrition interventions warranted at this time.  Please consult as needed.    Gustavus Bryant, MS, RD, LDN Inpatient Clinical Dietitian Please see [facility] for contact information.

## 2020-06-03 NOTE — Progress Notes (Signed)
PROGRESS NOTE   Subjective/Complaints: More alert this morning.  Wishes to continue with with hospice.  Waiting for bed at [facility].  Son and wife are at bedside.   ROS: +severe chronic lower back pain, +fatigue, limited due to fatigue  Objective:   No results found. No results for input(s): WBC, HGB, HCT, PLT in the last 72 hours. No results for input(s): NA, K, CL, CO2, GLUCOSE, BUN, CREATININE, CALCIUM in the last 72 hours. No intake or output data in the 24 hours ending 06/03/20 1006      Physical Exam: Vital Signs Blood pressure (!) 146/69, pulse 99, temperature 98.1 F (36.7 C), resp. rate 16, height 5\' 9"  (1.753 m), weight 61.7 kg, SpO2 97 %. Gen: no distress, normal appearing HEENT: oral mucosa pink and moist, NCAT Cardio: Reg rate Chest: normal effort, normal rate of breathing Abd: soft, non-distended Ext: no edema Psych: pleasant, normal affect Skin: intact Musculoskeletal: LB tender with palpation. ROM. Did not seem to have much tenderness in right BK limb. Limb well shaped, shrinker sock    Cervical back: Normal range of motion and neck supple.     Comments: RUE- 3/5 in proximal muscles- 3+/5 distally--inconcistent LUE- 4+/5 inconsistent effort RLE- 3/5 in HF/KE/KF- has R BKA LLE- 4 to 4+/5 in LLE- in HF/KE/DF and PF  Neurological:     Comments: Patient is lethargic this morning.  Makes eye contact with examiner.  expressive language deficits. dsyarthria.  follows simple commands. Sensation Intact to light touch in UEs and LLE/R BKA   Assessment/Plan: 1. Functional deficits which require 3+ hours per day of interdisciplinary therapy in a comprehensive inpatient rehab setting.  Physiatrist is providing close team supervision and 24 hour management of active medical problems listed below.  Physiatrist and rehab team continue to assess barriers to discharge/monitor patient progress toward functional  and medical goals  Care Tool:  Bathing        Body parts bathed by helper: Right arm,Left arm,Abdomen,Chest     Bathing assist Assist Level: Total Assistance - Patient < 25% (pt refusing to engage in LB bathing/dressing today)     Upper Body Dressing/Undressing Upper body dressing   What is the patient wearing?: Hospital gown only    Upper body assist Assist Level: Dependent - Patient 0%    Lower Body Dressing/Undressing Lower body dressing      What is the patient wearing?: Incontinence brief     Lower body assist Assist for lower body dressing: Dependent - Patient 0%     Toileting Toileting    Toileting assist Assist for toileting: Dependent - Patient 0%     Transfers Chair/bed transfer  Transfers assist     Chair/bed transfer assist level: Moderate Assistance - Patient 50 - 74% (slideboard)     Locomotion Ambulation   Ambulation assist   Ambulation activity did not occur: Safety/medical concerns (R hemi, R BKA, fatigue, weakness, decline in medical status)          Walk 10 feet activity   Assist  Walk 10 feet activity did not occur: Safety/medical concerns (R hemi, R BKA, fatigue, weakness, decline in medical status)  Walk 50 feet activity   Assist Walk 50 feet with 2 turns activity did not occur: Safety/medical concerns (R hemi, R BKA, fatigue, weakness, decline in medical status)         Walk 150 feet activity   Assist Walk 150 feet activity did not occur: Safety/medical concerns (R hemi, R BKA, fatigue, weakness, decline in medical status)         Walk 10 feet on uneven surface  activity   Assist Walk 10 feet on uneven surfaces activity did not occur: Safety/medical concerns (R hemi, R BKA, fatigue, weakness, decline in medical status)         Wheelchair     Assist Will patient use wheelchair at discharge?:  (TBD) Type of Wheelchair: Manual Wheelchair activity did not occur:  (decline in medical  status)  Wheelchair assist level: Supervision/Verbal cueing Max wheelchair distance: 68ft    Wheelchair 50 feet with 2 turns activity    Assist    Wheelchair 50 feet with 2 turns activity did not occur: Safety/medical concerns (decline in medical status)   Assist Level: Total Assistance - Patient < 25%   Wheelchair 150 feet activity     Assist  Wheelchair 150 feet activity did not occur: Safety/medical concerns (decline in medical status)   Assist Level: Total Assistance - Patient < 25%   Blood pressure (!) 146/69, pulse 99, temperature 98.1 F (36.7 C), resp. rate 16, height 5\' 9"  (1.753 m), weight 61.7 kg, SpO2 97 %.    Medical Problem List and Plan: 1.  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   Conitnue comfort care 2.  Impaired mobility -DVT/anticoagulation: Pradaxa discontinued give comfort care.              -antiplatelet therapy: N/A 3. Severe chronic low back pain  IV Morphine PRN (not used) and oral hydrocodone ordered.  4. Mood: Provide emotional support             -antipsychotic agents: N/A 5. Neuropsych: This patient is not capable of making decisions on his own behalf. 6. Skin/Wound Care: Routine skin checks. Air mattress ordered, Gerhardt's butt cream ordered for MASD.  7. Fluids/Electrolytes/Nutrition: see #8 8.  Dysphagia 1.  Dysphagia #1 nectar thick liquid diet follow-up speech therapy  -Refused MBS, comfort feeds 9.  Atrial fibrillation.  Diltiazem 240 mg daily.  Cardiac rate controlled 10.  CAD with stenting.  d/c Pradaxa.  No chest pain or shortness of breath 11.  CKD stage III.  Baseline creatinine 1.39-1.60.  Follow-up chemistries 12.  Seizure disorder.   d/c Depakote 500 mg twice daily 13.  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  Continue shrinker.  -wound looks fine 14.  Hypothyroidism.  Synthroid 15.  Hypertension.  d/c Cardura 4 mg. Continue Lopressor 25 mg twice daily  3/19 controlled 16  GERD.  Continue Protonix 17.  Hyperlipidemia.  Lipitor 18.  Diastolic congestive heart failure.  d/c Lasix 20 mg daily.  19.  Rheumatoid arthritis.  d/c chronic prednisone 20. L AC fossa IV d/c'd 21. Anemia: Hgb 10.2-->9.7 22. Pneumonia: d/c IV Zosyn. WBC normal on  repeat 23. Disposition: d/c to [facility] next available bed. Discussed with SW, no bed available today. Discussed with youngest son, middle son, and wife at bedside and they are in agreement with plan  LOS: 8 days A FACE TO FACE EVALUATION WAS [place] 06/03/2020, 10:06 AM

## 2020-06-03 NOTE — Progress Notes (Signed)
This chaplain is present for F/U spiritual care.  The Pt. is joined by his wife-[name] and two sons. The chaplain understands a son is on his way to the hospital. The family goal today is to have time with the Pt. and give [name] some respite.  The chaplain understands through reflective listening [name] is more comfortable with the Pt. mortality and the Pt. comfort focused care. [name]'s questions today are more focused on Hospice care.  The chaplain offered F/U spiritual care and opened the conversation for a clergy visit among the family.

## 2020-06-03 NOTE — Progress Notes (Signed)
Palliative Medicine Inpatient Follow Up Note HPI: 86 y.o.malewith past medical history of a fib, CADs/p stenting and amputation, diastolic CHF, CKD III, HTN, rheumatoid arthritis, hyperlipidemia, seizure disorder, cognitive deficits initiallyadmitted on3/14/2022withR side weakness and L gaze preference.Received TPA- MRI indicated acute ischemia. Transferred to inpatient rehab on 05/28/2020. Over the the following weekend had extension of his stroke, resulting in worsening mental status and dysphagia. Developed aspiration pnuemonia. Palliative medicine consulted for [place] and "hospice discussion".  Today's Discussion (06/03/2020):  *Please note that this is a verbal dictation therefore any spelling or grammatical errors are due to the "[employer]" system interpretation.  I met at bedside with patients spouse, [name] and his eldest son.   We discussed the plan from here in terms of symptom management. Reviewed that as [name] becomes more somnolent we will need to provide medications via alternative routes such.   [name] shared some concerns in regards to discharge and wonders if moving him to [facility] may be too much strain on his body.   I shared that we plan to check in daily to offer support and if at any point he appears unstable then we can plan to move him to 6N and keep him in house.   Provided emotional support through therapeutic listening.   Questions and concerns addressed   Objective Assessment: Vital Signs Vitals:   06/02/20 0530 06/02/20 0946  BP: 130/80 (!) 146/69  Pulse: 99 99  Resp: 16 16  Temp: 98 F (36.7 C) 98.1 F (36.7 C)  SpO2: 96% 97%   No intake or output data in the 24 hours ending 06/03/20 1521 Last Weight  Most recent update: 05/30/2020  5:36 AM   Weight  61.7 kg (136 lb 0.4 oz)           Gen: Ill appearing Elderly M HEENT: Dry mucous membranes CV: Regular rate and rhythm  PULM:  On RA ABD: soft/nontender  EXT: No edema   Neuro: Somnolent intermittently opening his eyes  SUMMARY OF RECOMMENDATIONS   DNAR/DNI  Comfort care - Medications per MAR  Liberalize visitation policy  Awaiting a bed at [facility]  Ongoing PMT support  Time Spent: 25 Greater than 50% of the time was spent in counseling and coordination of care ______________________________________________________________________________________ Michelle Ferolito [place] Palliative Medicine Team Team Cell Phone: [phone] Please utilize secure chat with additional questions, if there is no response within 30 minutes please call the above phone number  Palliative Medicine Team providers are available by phone from 7am to 7pm daily and can be reached through the team cell phone.  Should this patient require assistance outside of these hours, please call the patient's attending physician.     

## 2020-06-03 NOTE — Progress Notes (Addendum)
Manufacturing engineer [facility]) Hospital Liaison note.    Addendum:  Pt has been deemed eligible for [facility] by [facility] MD.  Chart and pt information have been reviewed by [facility] physician who, based on his ability at time of review to take PO meds in applesauce and to enjoy PO intake as documented, felt pt was likely eligible.  Report exchanged this morning with TOC Wells Guiles and new assessment shared with [facility] MD.  Hospice eligibility pending at this time.  [facility] is unable to offer a room today. Hospital Liaison will follow up tomorrow or sooner if a room becomes available. Please do not hesitate to call with questions.    Thank you for the opportunity to participate in this patients care.  Chrislyn Edison Pace, BSN, RN [place] (listed on [place] under Hospice/[facility])    [phone]

## 2020-06-04 ENCOUNTER — Inpatient Hospital Stay: Admit: 2020-06-04 | Payer: Medicare Other | Admitting: Internal Medicine

## 2020-06-04 ENCOUNTER — Inpatient Hospital Stay (HOSPITAL_COMMUNITY)
Admission: RE | Admit: 2020-06-04 | Discharge: 2020-06-10 | DRG: 951 | Disposition: E | Payer: Medicare Other | Source: Ambulatory Visit | Attending: Internal Medicine | Admitting: Internal Medicine

## 2020-06-04 DIAGNOSIS — Z955 Presence of coronary angioplasty implant and graft: Secondary | ICD-10-CM | POA: Diagnosis not present

## 2020-06-04 DIAGNOSIS — I10 Essential (primary) hypertension: Secondary | ICD-10-CM | POA: Diagnosis present

## 2020-06-04 DIAGNOSIS — Z66 Do not resuscitate: Secondary | ICD-10-CM | POA: Diagnosis present

## 2020-06-04 DIAGNOSIS — I69391 Dysphagia following cerebral infarction: Secondary | ICD-10-CM

## 2020-06-04 DIAGNOSIS — Z515 Encounter for palliative care: Secondary | ICD-10-CM | POA: Diagnosis present

## 2020-06-04 DIAGNOSIS — I482 Chronic atrial fibrillation, unspecified: Secondary | ICD-10-CM | POA: Diagnosis present

## 2020-06-04 DIAGNOSIS — E7800 Pure hypercholesterolemia, unspecified: Secondary | ICD-10-CM | POA: Diagnosis present

## 2020-06-04 DIAGNOSIS — N1831 Chronic kidney disease, stage 3a: Secondary | ICD-10-CM | POA: Diagnosis present

## 2020-06-04 DIAGNOSIS — I639 Cerebral infarction, unspecified: Secondary | ICD-10-CM | POA: Diagnosis present

## 2020-06-04 DIAGNOSIS — J690 Pneumonitis due to inhalation of food and vomit: Secondary | ICD-10-CM | POA: Diagnosis present

## 2020-06-04 DIAGNOSIS — M1990 Unspecified osteoarthritis, unspecified site: Secondary | ICD-10-CM | POA: Diagnosis present

## 2020-06-04 DIAGNOSIS — D638 Anemia in other chronic diseases classified elsewhere: Secondary | ICD-10-CM

## 2020-06-04 DIAGNOSIS — Z981 Arthrodesis status: Secondary | ICD-10-CM

## 2020-06-04 DIAGNOSIS — G40909 Epilepsy, unspecified, not intractable, without status epilepticus: Secondary | ICD-10-CM

## 2020-06-04 DIAGNOSIS — Z7952 Long term (current) use of systemic steroids: Secondary | ICD-10-CM

## 2020-06-04 DIAGNOSIS — F5104 Psychophysiologic insomnia: Secondary | ICD-10-CM | POA: Diagnosis present

## 2020-06-04 DIAGNOSIS — M069 Rheumatoid arthritis, unspecified: Secondary | ICD-10-CM | POA: Diagnosis present

## 2020-06-04 DIAGNOSIS — Z7983 Long term (current) use of bisphosphonates: Secondary | ICD-10-CM

## 2020-06-04 DIAGNOSIS — E039 Hypothyroidism, unspecified: Secondary | ICD-10-CM | POA: Diagnosis present

## 2020-06-04 DIAGNOSIS — T17900D Unspecified foreign body in respiratory tract, part unspecified causing asphyxiation, subsequent encounter: Secondary | ICD-10-CM

## 2020-06-04 DIAGNOSIS — K219 Gastro-esophageal reflux disease without esophagitis: Secondary | ICD-10-CM | POA: Diagnosis present

## 2020-06-04 DIAGNOSIS — Z89511 Acquired absence of right leg below knee: Secondary | ICD-10-CM

## 2020-06-04 DIAGNOSIS — Z8249 Family history of ischemic heart disease and other diseases of the circulatory system: Secondary | ICD-10-CM

## 2020-06-04 DIAGNOSIS — E785 Hyperlipidemia, unspecified: Secondary | ICD-10-CM | POA: Diagnosis present

## 2020-06-04 DIAGNOSIS — M549 Dorsalgia, unspecified: Secondary | ICD-10-CM | POA: Diagnosis present

## 2020-06-04 DIAGNOSIS — Z87891 Personal history of nicotine dependence: Secondary | ICD-10-CM

## 2020-06-04 DIAGNOSIS — I2510 Atherosclerotic heart disease of native coronary artery without angina pectoris: Secondary | ICD-10-CM | POA: Diagnosis present

## 2020-06-04 DIAGNOSIS — Z79899 Other long term (current) drug therapy: Secondary | ICD-10-CM

## 2020-06-04 DIAGNOSIS — Z96653 Presence of artificial knee joint, bilateral: Secondary | ICD-10-CM | POA: Diagnosis present

## 2020-06-04 DIAGNOSIS — I129 Hypertensive chronic kidney disease with stage 1 through stage 4 chronic kidney disease, or unspecified chronic kidney disease: Secondary | ICD-10-CM | POA: Diagnosis present

## 2020-06-04 DIAGNOSIS — Z841 Family history of disorders of kidney and ureter: Secondary | ICD-10-CM

## 2020-06-04 DIAGNOSIS — G8918 Other acute postprocedural pain: Secondary | ICD-10-CM

## 2020-06-04 DIAGNOSIS — Z809 Family history of malignant neoplasm, unspecified: Secondary | ICD-10-CM

## 2020-06-04 DIAGNOSIS — R1310 Dysphagia, unspecified: Secondary | ICD-10-CM | POA: Diagnosis present

## 2020-06-04 DIAGNOSIS — G8929 Other chronic pain: Secondary | ICD-10-CM | POA: Diagnosis present

## 2020-06-04 DIAGNOSIS — G8191 Hemiplegia, unspecified affecting right dominant side: Secondary | ICD-10-CM | POA: Diagnosis not present

## 2020-06-04 DIAGNOSIS — I739 Peripheral vascular disease, unspecified: Secondary | ICD-10-CM | POA: Diagnosis present

## 2020-06-04 DIAGNOSIS — Z79890 Hormone replacement therapy (postmenopausal): Secondary | ICD-10-CM

## 2020-06-04 DIAGNOSIS — I5032 Chronic diastolic (congestive) heart failure: Secondary | ICD-10-CM | POA: Diagnosis not present

## 2020-06-04 DIAGNOSIS — I6300 Cerebral infarction due to thrombosis of unspecified precerebral artery: Secondary | ICD-10-CM | POA: Diagnosis not present

## 2020-06-04 MED ORDER — HALOPERIDOL LACTATE 2 MG/ML PO CONC
0.5000 mg | ORAL | Status: DC | PRN
  Filled 2020-06-04: qty 0.3

## 2020-06-04 MED ORDER — GLYCOPYRROLATE 1 MG PO TABS
1.0000 mg | ORAL_TABLET | ORAL | Status: DC | PRN
Start: 2020-06-04 — End: 2020-06-05
  Filled 2020-06-04: qty 1

## 2020-06-04 MED ORDER — ONDANSETRON 4 MG PO TBDP: 4.0000 mg | ORAL_TABLET | Freq: Four times a day (QID) | ORAL | Status: DC | PRN

## 2020-06-04 MED ORDER — LORAZEPAM 2 MG/ML PO CONC
1.0000 mg | ORAL | Status: DC | PRN
Start: 2020-06-04 — End: 2020-06-05

## 2020-06-04 MED ORDER — BIOTENE DRY MOUTH MT LIQD: 15.0000 mL | OROMUCOSAL | Status: DC | PRN

## 2020-06-04 MED ORDER — GLYCOPYRROLATE 0.2 MG/ML IJ SOLN
0.2000 mg | INTRAMUSCULAR | Status: DC | PRN
Start: 2020-06-04 — End: 2020-06-05

## 2020-06-04 MED ORDER — LORAZEPAM 2 MG/ML IJ SOLN: 1.0000 mg | INTRAMUSCULAR | Status: DC | PRN

## 2020-06-04 MED ORDER — GLYCOPYRROLATE 0.2 MG/ML IJ SOLN: 0.2000 mg | INTRAMUSCULAR | Status: DC | PRN

## 2020-06-04 MED ORDER — LORAZEPAM 1 MG PO TABS: 1.0000 mg | ORAL_TABLET | ORAL | Status: DC | PRN

## 2020-06-04 MED ORDER — ACETAMINOPHEN 650 MG RE SUPP: 650.0000 mg | Freq: Four times a day (QID) | RECTAL | Status: DC | PRN

## 2020-06-04 MED ORDER — ACETAMINOPHEN 325 MG PO TABS: 650.0000 mg | ORAL_TABLET | Freq: Four times a day (QID) | ORAL | Status: DC | PRN

## 2020-06-04 MED ORDER — MORPHINE BOLUS VIA INFUSION
2.0000 mg | INTRAVENOUS | Status: DC | PRN
Start: 2020-06-04 — End: 2020-06-05
  Filled 2020-06-04: qty 2

## 2020-06-04 MED ORDER — ONDANSETRON HCL 4 MG/2ML IJ SOLN: 4.0000 mg | Freq: Four times a day (QID) | INTRAMUSCULAR | Status: DC | PRN

## 2020-06-04 MED ORDER — MORPHINE SULFATE (PF) 2 MG/ML IV SOLN
2.0000 mg | Freq: Four times a day (QID) | INTRAVENOUS | Status: DC
  Administered 2020-06-04: 2 mg via INTRAVENOUS
  Filled 2020-06-04: qty 1

## 2020-06-04 MED ORDER — POLYVINYL ALCOHOL 1.4 % OP SOLN
1.0000 [drp] | Freq: Four times a day (QID) | OPHTHALMIC | Status: DC | PRN
Start: 2020-06-04 — End: 2020-06-05
  Filled 2020-06-04: qty 15

## 2020-06-04 MED ORDER — HALOPERIDOL LACTATE 5 MG/ML IJ SOLN: 0.5000 mg | INTRAMUSCULAR | Status: DC | PRN

## 2020-06-04 MED ORDER — MORPHINE 100MG IN NS 100ML (1MG/ML) PREMIX INFUSION
5.0000 mg/h | INTRAVENOUS | Status: DC
  Administered 2020-06-04: 5 mg/h via INTRAVENOUS
  Filled 2020-06-04: qty 100

## 2020-06-04 MED ORDER — DIPHENHYDRAMINE HCL 50 MG/ML IJ SOLN: 12.5000 mg | INTRAMUSCULAR | Status: DC | PRN

## 2020-06-04 MED ORDER — HALOPERIDOL 0.5 MG PO TABS
0.5000 mg | ORAL_TABLET | ORAL | Status: DC | PRN
  Filled 2020-06-04: qty 1

## 2020-06-04 NOTE — Progress Notes (Signed)
Patient transferred to 6N per bed accompanied by RN , NT and family members.

## 2020-06-04 NOTE — Progress Notes (Signed)
Manufacturing engineer [facility])  [place] has a bed for Mr. Vigna today.  Discussed with his wife and son [name], who is at the bedside, and they are in agreement with transferring him to [facility] today.  Necessary consents have to be completed before he can transfer.   Discussed with [facility] manager Lattie Haw, who will help arrange transfer.  RN staff, please call report to [phone] at any time.  Once completed, please fax dc summary to [phone].  Venia Carbon RN, BSN, [facility] Hospital Liaison

## 2020-06-04 NOTE — Progress Notes (Signed)
[facility])  Decision made to keep Mr. Hemmerich in the hospital for EOL care as opposed to transferring to [facility]. Family was indecisive about transferring him given that his respirations had become more shallow.  Please reach out if we can assist in any other way.  Venia Carbon RN, BSN, [facility] Liaison

## 2020-06-04 NOTE — Progress Notes (Signed)
Per Palliative NP patient will be transferred to 6N. RN told sons in room with patient.

## 2020-06-04 NOTE — Progress Notes (Signed)
Patient has bed at [facility]. RN told son in room and he said they had texted him as well.

## 2020-06-04 NOTE — Progress Notes (Signed)
Dr. Posey Pronto called RN re: not transferring patient today we will reassess in the morning. Palliative NP notified RN as well.

## 2020-06-04 NOTE — Progress Notes (Addendum)
PROGRESS NOTE   Subjective/Complaints: Patient seen laying in bed this morning.  No reported issues overnight.  Wife at bedside.  She has several questions regarding ICU stay, strokes, pneumonia, palliative care, therapies, etc.  ROS: Limited due to behavior/cognition  Objective:   No results found. No results for input(s): WBC, HGB, HCT, PLT in the last 72 hours. No results for input(s): NA, K, CL, CO2, GLUCOSE, BUN, CREATININE, CALCIUM in the last 72 hours.  Intake/Output Summary (Last 24 hours) at 05/27/2020 1451 Last data filed at 06/03/2020 1500 Gross per 24 hour  Intake 938 ml  Output --  Net 938 ml        Physical Exam: Vital Signs Blood pressure (!) 146/69, pulse 99, temperature 98.1 F (36.7 C), resp. rate 16, height 5\' 9"  (1.753 m), weight 61.7 kg, SpO2 97 %. Constitutional: No distress . Vital signs reviewed. HENT: Normocephalic.  Atraumatic. Eyes: EOMI. No discharge. Cardiovascular: No JVD.  Irregularly irregular. Respiratory: Normal effort.  No stridor.  Bilateral clear to auscultation. GI: Non-distended.  BS +. Skin: Warm and dry.  Intact. Psych: Flat.  Delayed. Musc: Right BKA.  No edema in left lower extremity Neuro: Lethargic Motor: Limited due to participation Dysarthria   Assessment/Plan: 1. Functional deficits which require 3+ hours per day of interdisciplinary therapy in a comprehensive inpatient rehab setting.  Physiatrist is providing close team supervision and 24 hour management of active medical problems listed below.  Physiatrist and rehab team continue to assess barriers to discharge/monitor patient progress toward functional and medical goals  Care Tool:  Bathing        Body parts bathed by helper: Right arm,Left arm,Abdomen,Chest     Bathing assist Assist Level: Total Assistance - Patient < 25% (pt refusing to engage in LB bathing/dressing today)     Upper Body  Dressing/Undressing Upper body dressing   What is the patient wearing?: Hospital gown only    Upper body assist Assist Level: Dependent - Patient 0%    Lower Body Dressing/Undressing Lower body dressing      What is the patient wearing?: Incontinence brief     Lower body assist Assist for lower body dressing: Dependent - Patient 0%     Toileting Toileting    Toileting assist Assist for toileting: Dependent - Patient 0%     Transfers Chair/bed transfer  Transfers assist     Chair/bed transfer assist level: Moderate Assistance - Patient 50 - 74% (slideboard)     Locomotion Ambulation   Ambulation assist   Ambulation activity did not occur: Safety/medical concerns (R hemi, R BKA, fatigue, weakness, decline in medical status)          Walk 10 feet activity   Assist  Walk 10 feet activity did not occur: Safety/medical concerns (R hemi, R BKA, fatigue, weakness, decline in medical status)        Walk 50 feet activity   Assist Walk 50 feet with 2 turns activity did not occur: Safety/medical concerns (R hemi, R BKA, fatigue, weakness, decline in medical status)         Walk 150 feet activity   Assist Walk 150 feet activity did not occur: Safety/medical  concerns (R hemi, R BKA, fatigue, weakness, decline in medical status)         Walk 10 feet on uneven surface  activity   Assist Walk 10 feet on uneven surfaces activity did not occur: Safety/medical concerns (R hemi, R BKA, fatigue, weakness, decline in medical status)         Wheelchair     Assist Will patient use wheelchair at discharge?:  (TBD) Type of Wheelchair: Manual Wheelchair activity did not occur:  (decline in medical status)  Wheelchair assist level: Supervision/Verbal cueing Max wheelchair distance: 29ft    Wheelchair 50 feet with 2 turns activity    Assist    Wheelchair 50 feet with 2 turns activity did not occur: Safety/medical concerns (decline in medical  status)   Assist Level: Total Assistance - Patient < 25%   Wheelchair 150 feet activity     Assist  Wheelchair 150 feet activity did not occur: Safety/medical concerns (decline in medical status)   Assist Level: Total Assistance - Patient < 25%   Blood pressure (!) 146/69, pulse 99, temperature 98.1 F (36.7 C), resp. rate 16, height 5\' 9"  (1.753 m), weight 61.7 kg, SpO2 97 %.    Medical Problem List and Plan: 1.  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  Impaired mobility -DVT/anticoagulation: Pradaxa discontinued give comfort care.              -antiplatelet therapy: N/A 3. Severe chronic low back pain  Continue IV morphine as needed 4. Mood: Provide emotional support             -antipsychotic agents: N/A 5. Neuropsych: This patient is not capable of making decisions on his own behalf. 6. Skin/Wound Care: Routine skin checks. Air mattress ordered, Gerhardt's butt cream ordered for MASD.  7. Fluids/Electrolytes/Nutrition: see #8 8.    Post stroke dysphagia 1.  Dysphagia #1 nectar thick liquid diet follow-up speech therapy, changed to regular diet for comfort 9.  Atrial fibrillation.  Diltiazem 240 mg daily.  Cardiac rate controlled 10.  CAD with stenting.  d/c Pradaxa.  No chest pain or shortness of breath 11.  CKD stage III.  Baseline creatinine 1.39-1.60.  Follow-up chemistries 12.  Seizure disorder.  d/c Depakote 500 mg twice daily 13.  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  Continue shrinker. 14.  Hypothyroidism.  Synthroid 15.  Hypertension.  d/c Cardura 4 mg. Continue Lopressor 25 mg twice daily  Controlled on 3/26 16  GERD.  Continue Protonix 17.  Hyperlipidemia.   Lipitor 18.  Diastolic congestive heart failure.  d/c Lasix 20 mg daily.  19.  Rheumatoid arthritis.  d/c chronic prednisone 20. L AC fossa IV d/c'd 21. Anemia: Hgb 9.4 on 3/21 22. Pneumonia: DC'd IV Zosyn. WBC normal on repeat  > 70 minutes spent in total in total with wife regarding aforementioned, as well as reviewing hospital course, strokes, pneumonia, palliative care, therapies, imaging, discharge venue, answering questions with wife, in addition to coordinating initial transfer to [facility], then to 6 [address], and ultimately staying on the unit. Addendum again: After multiple further discussions with palliative care, hospitalist, rehab team, patient will be discharged to 6 [address]. for end-of-life care.  LOS: 9 days A FACE TO FACE  EVALUATION WAS PERFORMED  [name] Lorie Phenix 05/29/2020, 2:51 PM

## 2020-06-04 NOTE — Progress Notes (Signed)
Patient's wife voiced her concerns about the patients current plan of care (palliative/comfort care). She would like to discuss the plan with the doctor and social worker as soon as she can.

## 2020-06-04 NOTE — Progress Notes (Signed)
Dr. Posey Pronto called back re: transferring patient. NP notified to place order. Bed placement notified. Receiving RN notified. Family made aware of transfer.

## 2020-06-04 NOTE — Progress Notes (Signed)
   Palliative Medicine Inpatient Follow Up Note  HPI: 87 y.o.malewith past medical history of a fib, CADs/p stenting and amputation, diastolic CHF, CKD III, HTN, rheumatoid arthritis, hyperlipidemia, seizure disorder, cognitive deficits initiallyadmitted on3/14/2022withR side weakness and L gaze preference.Received TPA- MRI indicated acute ischemia. Transferred to inpatient rehab on 05/28/2020. Over the the following weekend had extension of his stroke, resulting in worsening mental status and dysphagia. Developed aspiration pnuemonia. Palliative medicine consulted for [place] and "hospice discussion".  Today's Discussion (05/15/2020):  *Please note that this is a verbal dictation therefore any spelling or grammatical errors are due to the "[name]" system interpretation.  I met at bedside with patients son(s). Patient has changed in appearance since this time yesterday and does appear to be in the more active phases of the end of life process.   His wife had prior expressed some concerns about transferring the patient which has been relayed to the hospice liaison for [facility]. As of this morning there was a bed for [employer] [facility]. Per conversations with hospice liaison, patients family, and bedside assessment it was determined that we should presently hold off on placement.  Patients family requested that patient be transferred to [facility] as they had heard about this from another loved [name]. I was able to speak to patients sons regarding this and we had agreed for [name] to stay on 49M. However, per later secure chat's with Dr. Ranell Patrick it has been identified that in order for patients to be able to stay in the CIR unit they must be able to participate in 3 hours of therapy daily. Given that [name] is unable to do this it has been requested that he transfer off the unit.   A bed on 6N therefore has been requested.   From a symptom management perspective I spoke with the patients sons  regarding initiation of morphine around the clock to aid in dyspnea relief.   Questions and concerns addressed   Objective Assessment: Vital Signs Vitals:   06/02/20 0530 06/02/20 0946  BP: 130/80 (!) 146/69  Pulse: 99 99  Resp: 16 16  Temp: 98 F (36.7 C) 98.1 F (36.7 C)  SpO2: 96% 97%   No intake or output data in the 24 hours ending 05/11/2020 1528 Last Weight  Most recent update: 05/30/2020  5:36 AM   Weight  61.7 kg (136 lb 0.4 oz)           Gen: Ill appearing Elderly M HEENT: Dry mucous membranes CV: Regular rate and rhythm  PULM:  On RA ABD: soft/nontender  EXT: No edema  Neuro: Somnolent intermittently opening his eyes  SUMMARY OF RECOMMENDATIONS   DNAR/DNI  Comfort care - Medications per [facility]  Liberalize visitation policy  Plan to transfer to 6N given that patient is not identified as stable for BP transfer and cannot stay in CIR  Ongoing PMT support  Time Spent: 35 Greater than 50% of the time was spent in counseling and coordination of care ______________________________________________________________________________________ [place] Team Team Cell Phone: [phone] Please utilize secure chat with additional questions, if there is no response within 30 minutes please call the above phone number  Palliative Medicine Team providers are available by phone from 7am to 7pm daily and can be reached through the team cell phone.  Should this patient require assistance outside of these hours, please call the patient's attending physician.

## 2020-06-04 NOTE — H&P (Signed)
History and Physical    [name] DHR:[id number] DOB: [birth year] DOA: 05/28/2020  PCP: [name], MD Consultants:  Croitoru - cardiology; [name] - orthopedics Patient coming from:  Home - lives with wife; NOK: Wife, [name], [phone]   Chief Complaint: end of life care  HPI: [name] is a 87 y.o. male with medical history significant of HTN; stage 3 CKD; RA on chronic prednisone; HLD; seizure d/o; afib (self-discontinued Eliquis in 2020); CAD s/p stent; and PVD s/p R BKA who was admitted from 3/12-17 with CVA for which he received tPA.  He showed signs of improvement and was transferred to CIR on 3/17 but over the next few days had extension of the stroke and decreasing mental status.  He began to have dysphagia and had aspiration PNA.  With ongoing deterioration in care, his family met with palliative care and decided to move to comfort measures.  As such, he was unable to remain in CIR.  There had bene discussion about transfer to [facility], but the family was concerned about such a big move with imminent death anticipated and so requested admission back to the acute care hospital.   At the time of my evaluation, 2 of his 3 sons were initially present and reported that the patient was coherent enough several days ago to participate in care planning.  All family is in agreement with the plan.  His wife (2nd wife, married about 25 years according to the sons) entered the room while I was there and went to greet her husband.  While nonverbal, he did appear to respond to her presence and tried to return her kisses (he subsequently tried to give some to me, as well).  Family reiterated a goal for comfort measures and initiation of morphine drip to ensure comfort since he has been moaning and moving more this afternoon.   Review of Systems: Unable to perform   Past Medical History:  Diagnosis Date  . Abnormality of gait 09/22/2015  . Arthritis   . Atrial fibrillation  ([place])   . CAD (coronary artery disease)    Stent to RCA, Penta stent, 99% reduced to 0% 2002.  . Cancer ([place])    skin CA removed from back  . 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 Chronic insomnia 03/30/2015  . Chronic kidney disease, stage 3 ([place])   . Complication of anesthesia    trouble waking up  . GERD (gastroesophageal reflux disease)   . Hypercholesteremia   . Hypertension   . Hypothyroidism   . Iron deficiency anemia   . Memory difficulty 09/22/2015  . Osteoarthritis   . PAD (peripheral artery disease) ([place])   . Pneumonia   . Seizures ([place])   . Sepsis ([place]) 05/2017  . Transient alteration of awareness 03/30/2015  . Vertigo    hx of  . Vitamin D deficiency     Past Surgical History:  Procedure Laterality Date  . AMPUTATION Right 03/28/2018   Procedure: AMPUTATION BELOW KNEE;  Surgeon: Newt Minion, MD;  Location: [place];  Service: Orthopedics;  Laterality: Right;  . APPLICATION OF WOUND VAC Right 01/23/2019   Procedure: Application Of  Prevena Wound Vac;  Surgeon: Newt Minion, MD;  Location: [place];  Service: Orthopedics;  Laterality: Right;  . BACK SURGERY    . BIOPSY  01/08/2020   Procedure: BIOPSY;  Surgeon: Gatha Mayer, MD;  Location: [facility] ENDOSCOPY;  Service: Endoscopy;;  . ESOPHAGOGASTRODUODENOSCOPY (EGD)  WITH PROPOFOL N/A 01/08/2020   Procedure: ESOPHAGOGASTRODUODENOSCOPY (EGD) WITH PROPOFOL;  Surgeon: Gatha Mayer, MD;  Location: [place];  Service: Endoscopy;  Laterality: N/A;  . EYE SURGERY     Bilateral Cataract surgery   . HERNIA REPAIR    . I & D EXTREMITY Right 05/10/2015   Procedure: IRRIGATION AND DEBRIDEMENT EXTREMITY;  Surgeon: Leanora Cover, MD;  Location: [place];  Service: Orthopedics;  Laterality: Right;  . KNEE ARTHROPLASTY     right knee X 2; left knee once  . LAMINECTOMY     X 6  . LEG AMPUTATION BELOW KNEE Right 03/28/2018  . POSTERIOR CERVICAL FUSION/FORAMINOTOMY  01/28/2012   Procedure:  POSTERIOR CERVICAL FUSION/FORAMINOTOMY LEVEL 3;  Surgeon: [name], MD;  Location: [place] NEURO ORS;  Service: Neurosurgery;  Laterality: Left;  Posterior Cervical Five-Thoracic One Fusion, Arthrodesis with LEFT Cervical Seven-thoracic One Laminectomy, Foraminotomy and Resection of Synovial Cyst  . POSTERIOR CERVICAL FUSION/FORAMINOTOMY N/A 01/29/2013   Procedure: POSTERIOR CERVICAL FUSION/FORAMINOTOMY LEVEL 1 and C2-5 Posteriolateral Arthrodesis;  Surgeon: [name], MD;  Location: [place] NEURO ORS;  Service: Neurosurgery;  Laterality: N/A;  C2-C3 Laminectomy C2-C3 posterior cervical arthrodesis  . STUMP REVISION Right 01/23/2019   Procedure: REVISION RIGHT BELOW KNEE AMPUTATION;  Surgeon: Newt Minion, MD;  Location: [place];  Service: Orthopedics;  Laterality: Right;  . TONSILLECTOMY      Social History   Socioeconomic History  . Marital status: Married    Spouse name: Abdikadir Fohl  . Number of children: 3  . Years of education: 40  . Highest education level: Professional school degree (e.g., MD, DDS, DVM, JD)  Occupational History  . Occupation: Retired Software engineer  Tobacco Use  . Smoking status: Former Smoker    Packs/day: 1.00    Years: 5.00    Pack years: 5.00  . Smokeless tobacco: Never Used  Vaping Use  . Vaping Use: Never used  Substance and Sexual Activity  . Alcohol use: Not Currently    Comment: rare  . Drug use: No  . Sexual activity: Not Currently  Other Topics Concern  . Not on file  Social History Narrative   Lives at home w/ his wife [name]   Patient drinks 4-5 cups of coffee daily.   Patient is right handed.    Social Determinants of Health   Financial Resource Strain: Low Risk   . Difficulty of Paying Living Expenses: Not hard at all  Food Insecurity: No Food Insecurity  . Worried About Charity fundraiser in the Last Year: Never true  . Ran Out of Food in the Last Year: Never true  Transportation Needs: No Transportation Needs  . Lack of  Transportation (Medical): No  . Lack of Transportation (Non-Medical): No  Physical Activity: Inactive  . Days of Exercise per Week: 0 days  . Minutes of Exercise per Session: 0 min  Stress: No Stress Concern Present  . Feeling of Stress : Only a little  Social Connections: Moderately Integrated  . Frequency of Communication with Friends and Family: Twice a week  . Frequency of Social Gatherings with Friends and Family: More than three times a week  . Attends Religious Services: 1 to 4 times per year  . Active Member of Clubs or Organizations: No  . Attends Archivist Meetings: Never  . Marital Status: Married  Human resources officer Violence: Not At Risk  . Fear of Current or Ex-Partner: No  . Emotionally Abused: No  .  Physically Abused: No  . Sexually Abused: No    Allergies  Allergen Reactions  . Eliquis [Apixaban] Other (See Comments)    bleeding  . Demerol [Meperidine] Nausea And Vomiting  . Keppra [Levetiracetam] Other (See Comments)    Causes sleepiness, mental status changes  . Vimpat [Lacosamide] Other (See Comments)    Over sedated     Family History  Problem Relation Age of Onset  . Hypertension Mother   . Cancer Mother   . Kidney failure Father   . Heart disease Father     Prior to Admission medications   Medication Sig Start Date End Date Taking? Authorizing Provider  acetaminophen (TYLENOL) 160 MG/5ML solution Place 20.3 mLs (650 mg total) into feeding tube every 4 (four) hours as needed for mild pain (or temp > 37.5 C (99.5 F)). 05/26/20   Bailey-Modzik, Delila A, NP  acetaminophen (TYLENOL) 325 MG tablet Take 2 tablets (650 mg total) by mouth every 4 (four) hours as needed for mild pain (or temp > 37.5 C (99.5 F)). 05/26/20   Bailey-Modzik, Delila A, NP  acetaminophen (TYLENOL) 650 MG suppository Place 1 suppository (650 mg total) rectally every 4 (four) hours as needed for mild pain (or temp > 37.5 C (99.5 F)). 05/26/20   Bailey-Modzik, Delila A, NP   alendronate (FOSAMAX) 70 MG tablet Take 70 mg by mouth every Wednesday. Remain upright for 30-60 minutes. 01/06/12   [provider]  atorvastatin (LIPITOR) 80 MG tablet Take 1 tablet (80 mg total) by mouth daily. 05/26/20   Bailey-Modzik, Delila A, NP  Calcium Carb-Cholecalciferol (CALCIUM 600+D) 600-800 MG-UNIT TABS Take 1 tablet by mouth daily.    [provider]  dabigatran (PRADAXA) 150 MG CAPS capsule Take 1 capsule (150 mg total) by mouth every 12 (twelve) hours. 05/26/20   Bailey-Modzik, Delila A, NP  diltiazem (CARDIZEM CD) 240 MG 24 hr capsule TAKE 1 CAPSULE BY MOUTH EVERY MORNING. HOLD IF SYSTOLIC BLOOD PRESSURE IS LESS THAN 100 Patient taking differently: Take 240 mg by mouth daily. Hold if systolic blood pressure is less than 100. 12/10/19   Croitoru, Mihai, MD  diltiazem (CARDIZEM CD) 240 MG 24 hr capsule Take 1 capsule (240 mg total) by mouth daily. 06/02/20   Angiulli, Lavon Paganini, PA-C  divalproex (DEPAKOTE ER) 500 MG 24 hr tablet Take 1 tablet (500 mg total) by mouth 2 (two) times daily. 05/26/20   Bailey-Modzik, Delila A, NP  docusate sodium (COLACE) 100 MG capsule Take 100 mg by mouth daily as needed for mild constipation.    [provider]  doxazosin (CARDURA) 4 MG tablet TAKE 1 TABLET(4 MG) BY MOUTH AT BEDTIME Patient taking differently: Take 4 mg by mouth at bedtime. 12/17/19   Croitoru, Mihai, MD  doxazosin (CARDURA) 4 MG tablet Take 1 tablet (4 mg total) by mouth at bedtime. 06/02/20   Angiulli, Lavon Paganini, PA-C  furosemide (LASIX) 20 MG tablet Take 1 tablet (20 mg total) by mouth daily. 07/20/19 01/06/29  Croitoru, Mihai, MD  HYDROcodone-acetaminophen (NORCO) 10-325 MG tablet Take 1 tablet by mouth every 4 (four) hours as needed for pain. 06/23/19   [provider]  HYDROcodone-acetaminophen (NORCO) 10-325 MG tablet Take 2 tablets by mouth every 4 (four) hours. 06/02/20   Angiulli, Lavon Paganini, PA-C  levothyroxine (SYNTHROID, LEVOTHROID) 150 MCG tablet  Take 150 mcg by mouth daily before breakfast. 12/27/11   [provider]  lidocaine (LIDODERM) 5 % Place 1-5 patches onto the skin daily. Remove & Discard  patch within 12 hours or as directed by MD (lower back and shoulders)    [provider]  lidocaine (LIDODERM) 5 % Place 3 patches onto the skin daily. Remove & Discard patch within 12 hours or as directed by MD 06/02/20   Angiulli, Lavon Paganini, PA-C  Maltodextrin-Xanthan Gum ([place]) POWD Take 1 Container by mouth as needed. 05/26/20   Bailey-Modzik, Delila A, NP  metoprolol tartrate (LOPRESSOR) 25 MG tablet TAKE 1 TABLET BY MOUTH TWICE DAILY WITH A MEAL OR IMMEDIATELY AFTER Patient taking differently: Take 25 mg by mouth 2 (two) times daily. WITH A MEAL OR IMMEDIATELY AFTER 05/02/20   Croitoru, Dani Gobble, MD  metoprolol tartrate (LOPRESSOR) 25 MG tablet Take 1 tablet (25 mg total) by mouth 2 (two) times daily. 06/02/20   Angiulli, Lavon Paganini, PA-C  Morphine Sulfate (MORPHINE CONCENTRATE) 10 MG/0.5ML SOLN concentrated solution Place 0.25 mLs (5 mg total) under the tongue every 2 (two) hours as needed for moderate pain (or dyspnea). 06/02/20   Angiulli, Lavon Paganini, PA-C  Multiple Vitamins-Minerals (CERTAVITE/ANTIOXIDANTS) TABS Take 1 tablet by mouth every evening.     [provider]  nitrofurantoin (MACRODANTIN) 100 MG capsule Take 100 mg by mouth daily. 09/30/19   [provider]  nitroGLYCERIN (NITROSTAT) 0.4 MG SL tablet Place 1 tablet (0.4 mg total) under the tongue every 5 (five) minutes as needed for chest pain (call 911 afer 3 doses and if chest pain persists). 06/19/18   Croitoru, Mihai, MD  pantoprazole (PROTONIX) 40 MG tablet Take 1 tablet (40 mg total) by mouth 2 (two) times daily. Patient not taking: Reported on 05/23/2020 01/11/20 02/10/20  Darliss Cheney, MD  polyethylene glycol (MIRALAX / GLYCOLAX) 17 g packet Take 17 g by mouth at bedtime.    [provider]  potassium chloride SA (KLOR-CON)  20 MEQ tablet Take 0.5 tablets (10 mEq total) by mouth daily. 10/22/19   Croitoru, Mihai, MD  predniSONE (DELTASONE) 1 MG tablet Take 4 mg by mouth daily with breakfast.    [provider]  predniSONE (DELTASONE) 5 MG tablet Take 5 mg by mouth daily. 06/17/19   [provider]  traZODone (DESYREL) 50 MG tablet Take 1 tablet (50 mg total) by mouth at bedtime. 06/02/20   Angiulli, Lavon Paganini, PA-C  valproate 500 mg in dextrose 5 % 50 mL Inject 500 mg into the vein every 12 (twelve) hours. 06/02/20   Angiulli, Lavon Paganini, PA-C    Physical Exam: There were no vitals filed for this visit.   . General:  Appears calm and awake but does not fixate gaze and no attempts at verbalization other than periodic moaning . Eyes:   normal lids, iris, no attempt at fixing gaze . ENT:  grossly normal lips & tongue . Neck:  no LAD, masses or thyromegaly . Cardiovascular:  RRR, no m/r/g.  . Respiratory:   CTA bilaterally with no wheezes/rales/rhonchi.  Normal respiratory effort. . Abdomen:  soft, NT, ND . Skin:  no rash or induration seen on limited exam . Musculoskeletal:  S/p R BKA . Psychiatric:  Alert but nonverbal other than intermittent moaning . Neurologic:  Unable to effectively perform    Radiological Exams on Admission: Independently reviewed - see discussion in A/P where applicable  No results found.    Labs on Admission: I have personally reviewed the available labs and imaging studies at the time of the admission.  Pertinent labs:   None   Assessment/Plan Principal Problem:   Admission  for end of life care Active Problems:   Essential hypertension   Hypothyroidism   Seizure disorder (HCC)   Chronic back pain   Stage 3a chronic kidney disease   Chronic atrial fibrillation   Aspiration syndrome, subsequent encounter   Coronary artery disease involving native coronary artery of native heart without angina pectoris   S/P BKA (below knee amputation) unilateral, right  (HCC)   Stroke ([place])   Dysphagia, post-stroke   End of life care -Patient presenting with acute CVA on 3/12; known ASCVD RF and stopped taking Eliquis for afib in 2020 -He was admitted to [facility] and given tPA with improvement -He was transferred to [facility] on 3/17 but began to regress within several days -After long discussion over the last few days, family has decided to proceed with comfort care only -He was planned to go to [facility] and this was in process but family subsequently decided he was deteriorating too quickly and preferred admission back at [facility] for comfort care and palliative care consult -Comfort care order set utilized -Pain control with morphine drip, as requested by family     DVT prophylaxis: None - comfort measures Code Status: DNR - confirmed with family Family Communication: Wife and sons were present throughout evaluation Disposition Plan: Anticipate in-hospital death Consults called: Palliative care Admission status: Admit - It is my clinical opinion that admission to INPATIENT is reasonable and necessary because of the expectation that this patient will require hospital care that crosses at least 2 midnights to treat this condition based on the medical complexity of the problems presented.  Given the aforementioned information, the predictability of an adverse outcome is felt to be significant.   Karmen Bongo MD [facility]   How to contact the [facility] Attending or Consulting provider [place] or covering provider during after hours [place], for this patient?  1. Check the care team in [facility] and look for a) attending/consulting TRH provider listed and b) the [facility] team listed 2. Log into [email or web address] and use [place]'s universal password to access. If you do not have the password, please contact the hospital operator. 3. Locate the [facility] provider you are looking for under [facility] and page to a number that you can be directly reached. 4. If you still  have difficulty reaching the provider, please page the [facility] (Director on Call) for the Hospitalists listed on amion for assistance.   05/27/2020, 5:36 PM

## 2020-06-04 NOTE — Progress Notes (Addendum)
Patient transferring to 6N. Report given to receiving RN.

## 2020-06-06 NOTE — Plan of Care (Signed)
Patient transferred to [facility] for decline in function.

## 2020-06-10 NOTE — Progress Notes (Signed)
   2020/06/10 0600  Attending [place]  Attending Physician Notified Y  Attending Physician (First and Last Name) Lovey Newcomer  Will the above attending physician sign death certificate? No  Physician (First and Last Name) Who Will Sign Death Certificate Shelly Coss  Post Mortem Checklist  Date of Death 06/10/20  Time of Death 0445  Pronounced By Threasa Beards Tayon Parekh/ Nellie RN  Next of kin notified Yes  Name of next of kin notified of death Antonia Culbertson  Contact Person's Relationship to Patient Spouse  Contact Person's Phone Number [phone]  Contact Person's address [address] [place]  Was the patient a No Code Blue or a Limited Code Blue? No  Did the patient die unattended? No  Patient restrained? Not applicable  Height 5\' 9"  (1.753 m)  Weight 61.7 kg  [place] [facility]  Notification Date 06-10-20  Notification Time [place] [facility] Number [phone]  Is patient a potential donor? N  Autopsy  Autopsy requested by N/A  Patient and Hospital Property Returned  Patient belongings from bedside/safe/pharmacy returned  Yes  Valuables returned to? Spouse  Specify valuables returned clothing & cell phone  Dead on [place] (Emergency Department)  Patient dead on arrival? No  Notifications  Patient Placement notified that Post Mortem checklist is complete Yes  Medical Examiner  Is this a medical examiner's case? [place] home name/address/phone # [place] [phone]  Planned location of pickup [place]

## 2020-06-10 NOTE — Progress Notes (Signed)
Patient passed away at 0445. Spouse at bedside.

## 2020-06-10 NOTE — Death Summary Note (Signed)
Death Summary  DYMOND SPREEN SKS:[id number] DOB: [birth year] DOA: 07/04/2020  PCP: [name], MD PCP/Office notified: no  Admit date: 2020-07-04 Date of Death: 2020/07/05  Final Diagnoses:  Principal Problem:   Admission for end of life care Active Problems:   Essential hypertension   Hypothyroidism   Seizure disorder ([place])   Chronic back pain   Stage 3a chronic kidney disease   Chronic atrial fibrillation   Aspiration syndrome, subsequent encounter   Coronary artery disease involving native coronary artery of native heart without angina pectoris   S/P BKA (below knee amputation) unilateral, right ([place])   Stroke ([place])   Dysphagia, post-stroke    1. Acute CVA 2. Aspiration PNA due to #1  History of present illness:  [name] is a 87 y.o. male with medical history significant of HTN; stage 3 CKD; RA on chronic prednisone; HLD; seizure d/o; afib (self-discontinued Eliquis in 2020); CAD s/p stent; and PVD s/p R BKA who was admitted from 3/12-17 with CVA for which he received tPA.  He showed signs of improvement and was transferred to CIR on 3/17 but over the next few days had extension of the stroke and decreasing mental status.  He began to have dysphagia and had aspiration PNA.  With ongoing deterioration in care, his family met with palliative care and decided to move to comfort measures.  As such, he was unable to remain in CIR.  There had been discussion about transfer to [facility], but the family was concerned about such a big move with imminent death anticipated and so requested admission back to the acute care hospital.   Hospital Course:  He was admitted on comfort measures and died peacefully on the palliative care floor overnight.  Family was notified by overnight coverage.    Signed:  Karmen Bongo  [facility] 07/05/2020, 10:17 AM

## 2020-06-10 NOTE — Progress Notes (Signed)
   Palliative Medicine Inpatient Follow Up Note   While in preparation to round on Alhassan this morning it was noted through charting that he had passed away.   Family had been informed as his wife, [name] was present at bedside.  Palliative care remains available for any additional needs/questions of patients family.  No Charge ______________________________________________________________________________________ [place] Team Team Cell Phone: [phone] Please utilize secure chat with additional questions, if there is no response within 30 minutes please call the above phone number  Palliative Medicine Team providers are available by phone from 7am to 7pm daily and can be reached through the team cell phone.  Should this patient require assistance outside of these hours, please call the patient's attending physician.

## 2020-06-10 DEATH — deceased

## 2020-06-11 ENCOUNTER — Encounter: Payer: Self-pay | Admitting: *Deleted

## 2020-06-11 NOTE — Telephone Encounter (Signed)
This encounter was created in error - please disregard.

## 2020-07-12 ENCOUNTER — Ambulatory Visit: Payer: Medicare Other | Admitting: *Deleted

## 2020-08-30 ENCOUNTER — Other Ambulatory Visit: Payer: Self-pay

## 2020-08-31 NOTE — Patient Outreach (Signed)
[facility]) Care Management  08/31/2020  [name] 1933/06/21 [id number]   No telephone outreach to patient as patient deceased. MRS= 6.   [place] Management Assistant
